# Patient Record
Sex: Male | Born: 1975 | Race: Black or African American | Hispanic: No | Marital: Married | Smoking: Current every day smoker
Health system: Southern US, Community
[De-identification: ages and names within clinical notes are randomized; demographics above are authoritative.]

## PROBLEM LIST (undated history)

## (undated) DIAGNOSIS — F329 Major depressive disorder, single episode, unspecified: Secondary | ICD-10-CM

## (undated) DIAGNOSIS — E119 Type 2 diabetes mellitus without complications: Secondary | ICD-10-CM

## (undated) DIAGNOSIS — K219 Gastro-esophageal reflux disease without esophagitis: Secondary | ICD-10-CM

## (undated) DIAGNOSIS — R609 Edema, unspecified: Secondary | ICD-10-CM

## (undated) DIAGNOSIS — M1990 Unspecified osteoarthritis, unspecified site: Secondary | ICD-10-CM

## (undated) DIAGNOSIS — K319 Disease of stomach and duodenum, unspecified: Secondary | ICD-10-CM

## (undated) DIAGNOSIS — IMO0001 Reserved for inherently not codable concepts without codable children: Secondary | ICD-10-CM

## (undated) DIAGNOSIS — K3184 Gastroparesis: Secondary | ICD-10-CM

## (undated) DIAGNOSIS — F32A Depression, unspecified: Secondary | ICD-10-CM

## (undated) DIAGNOSIS — Z992 Dependence on renal dialysis: Secondary | ICD-10-CM

## (undated) DIAGNOSIS — R51 Headache: Secondary | ICD-10-CM

## (undated) DIAGNOSIS — N186 End stage renal disease: Secondary | ICD-10-CM

## (undated) DIAGNOSIS — S88119A Complete traumatic amputation at level between knee and ankle, unspecified lower leg, initial encounter: Secondary | ICD-10-CM

## (undated) DIAGNOSIS — A4902 Methicillin resistant Staphylococcus aureus infection, unspecified site: Secondary | ICD-10-CM

## (undated) DIAGNOSIS — G8929 Other chronic pain: Secondary | ICD-10-CM

## (undated) DIAGNOSIS — I10 Essential (primary) hypertension: Secondary | ICD-10-CM

## (undated) DIAGNOSIS — H269 Unspecified cataract: Secondary | ICD-10-CM

## (undated) DIAGNOSIS — Z5189 Encounter for other specified aftercare: Secondary | ICD-10-CM

## (undated) HISTORY — DX: Depression, unspecified: F32.A

## (undated) HISTORY — PX: COMBINED KIDNEY-PANCREAS TRANSPLANT: SHX1382

## (undated) HISTORY — DX: Essential (primary) hypertension: I10

## (undated) HISTORY — DX: Gastro-esophageal reflux disease without esophagitis: K21.9

## (undated) HISTORY — DX: Unspecified cataract: H26.9

## (undated) HISTORY — DX: Other chronic pain: G89.29

## (undated) HISTORY — PX: CATARACT EXTRACTION: SUR2

## (undated) HISTORY — DX: Major depressive disorder, single episode, unspecified: F32.9

## (undated) HISTORY — DX: Complete traumatic amputation at level between knee and ankle, unspecified lower leg, initial encounter: S88.119A

## (undated) HISTORY — DX: Headache: R51

## (undated) HISTORY — DX: Disease of stomach and duodenum, unspecified: K31.9

## (undated) HISTORY — DX: Edema, unspecified: R60.9

## (undated) HISTORY — PX: BELOW KNEE LEG AMPUTATION: SUR23

---

## 1998-06-13 ENCOUNTER — Inpatient Hospital Stay (HOSPITAL_COMMUNITY): Admission: EM | Admit: 1998-06-13 | Discharge: 1998-06-24 | Payer: Self-pay | Admitting: Emergency Medicine

## 1999-07-14 ENCOUNTER — Emergency Department (HOSPITAL_COMMUNITY): Admission: EM | Admit: 1999-07-14 | Discharge: 1999-07-14 | Payer: Self-pay | Admitting: Emergency Medicine

## 1999-07-15 ENCOUNTER — Emergency Department (HOSPITAL_COMMUNITY): Admission: EM | Admit: 1999-07-15 | Discharge: 1999-07-15 | Payer: Self-pay | Admitting: Emergency Medicine

## 1999-07-19 ENCOUNTER — Emergency Department (HOSPITAL_COMMUNITY): Admission: EM | Admit: 1999-07-19 | Discharge: 1999-07-19 | Payer: Self-pay | Admitting: Emergency Medicine

## 1999-07-24 ENCOUNTER — Inpatient Hospital Stay (HOSPITAL_COMMUNITY): Admission: EM | Admit: 1999-07-24 | Discharge: 1999-07-31 | Payer: Self-pay | Admitting: Emergency Medicine

## 1999-07-24 ENCOUNTER — Encounter: Payer: Self-pay | Admitting: Emergency Medicine

## 1999-08-02 ENCOUNTER — Encounter (HOSPITAL_COMMUNITY): Admission: RE | Admit: 1999-08-02 | Discharge: 1999-10-31 | Payer: Self-pay | Admitting: Orthopedic Surgery

## 1999-08-24 ENCOUNTER — Encounter: Admission: RE | Admit: 1999-08-24 | Discharge: 1999-08-24 | Payer: Self-pay | Admitting: Hematology and Oncology

## 1999-11-07 ENCOUNTER — Encounter (INDEPENDENT_AMBULATORY_CARE_PROVIDER_SITE_OTHER): Payer: Self-pay | Admitting: *Deleted

## 1999-11-07 ENCOUNTER — Inpatient Hospital Stay (HOSPITAL_COMMUNITY): Admission: EM | Admit: 1999-11-07 | Discharge: 1999-11-11 | Payer: Self-pay | Admitting: Emergency Medicine

## 1999-11-07 ENCOUNTER — Encounter: Payer: Self-pay | Admitting: Emergency Medicine

## 1999-11-23 ENCOUNTER — Encounter: Admission: RE | Admit: 1999-11-23 | Discharge: 2000-02-21 | Payer: Self-pay | Admitting: Orthopedic Surgery

## 1999-11-26 ENCOUNTER — Emergency Department (HOSPITAL_COMMUNITY): Admission: EM | Admit: 1999-11-26 | Discharge: 1999-11-26 | Payer: Self-pay | Admitting: Emergency Medicine

## 1999-11-27 ENCOUNTER — Encounter: Payer: Self-pay | Admitting: Emergency Medicine

## 1999-12-06 ENCOUNTER — Encounter: Admission: RE | Admit: 1999-12-06 | Discharge: 1999-12-06 | Payer: Self-pay | Admitting: Family Medicine

## 2000-01-06 ENCOUNTER — Encounter: Admission: RE | Admit: 2000-01-06 | Discharge: 2000-01-06 | Payer: Self-pay | Admitting: Family Medicine

## 2000-02-20 ENCOUNTER — Encounter (INDEPENDENT_AMBULATORY_CARE_PROVIDER_SITE_OTHER): Payer: Self-pay

## 2000-02-20 ENCOUNTER — Encounter: Payer: Self-pay | Admitting: Family Medicine

## 2000-02-20 ENCOUNTER — Inpatient Hospital Stay (HOSPITAL_COMMUNITY): Admission: EM | Admit: 2000-02-20 | Discharge: 2000-02-24 | Payer: Self-pay | Admitting: Emergency Medicine

## 2000-02-29 ENCOUNTER — Encounter: Admission: RE | Admit: 2000-02-29 | Discharge: 2000-05-29 | Payer: Self-pay | Admitting: Internal Medicine

## 2000-03-03 ENCOUNTER — Encounter: Payer: Self-pay | Admitting: Emergency Medicine

## 2000-03-03 ENCOUNTER — Emergency Department (HOSPITAL_COMMUNITY): Admission: EM | Admit: 2000-03-03 | Discharge: 2000-03-03 | Payer: Self-pay | Admitting: Emergency Medicine

## 2000-03-20 ENCOUNTER — Encounter: Admission: RE | Admit: 2000-03-20 | Discharge: 2000-03-20 | Payer: Self-pay | Admitting: Family Medicine

## 2000-05-16 ENCOUNTER — Emergency Department (HOSPITAL_COMMUNITY): Admission: EM | Admit: 2000-05-16 | Discharge: 2000-05-16 | Payer: Self-pay | Admitting: Emergency Medicine

## 2000-05-17 ENCOUNTER — Inpatient Hospital Stay (HOSPITAL_COMMUNITY): Admission: EM | Admit: 2000-05-17 | Discharge: 2000-06-30 | Payer: Self-pay | Admitting: Emergency Medicine

## 2000-05-17 ENCOUNTER — Encounter: Payer: Self-pay | Admitting: Emergency Medicine

## 2000-05-17 ENCOUNTER — Encounter: Payer: Self-pay | Admitting: Family Medicine

## 2000-05-19 ENCOUNTER — Encounter: Payer: Self-pay | Admitting: Orthopedic Surgery

## 2000-05-20 ENCOUNTER — Encounter: Payer: Self-pay | Admitting: Family Medicine

## 2000-05-23 ENCOUNTER — Encounter: Payer: Self-pay | Admitting: Family Medicine

## 2000-07-04 ENCOUNTER — Encounter: Admission: RE | Admit: 2000-07-04 | Discharge: 2000-10-02 | Payer: Self-pay | Admitting: Orthopedic Surgery

## 2000-07-06 ENCOUNTER — Encounter: Admission: RE | Admit: 2000-07-06 | Discharge: 2000-07-06 | Payer: Self-pay | Admitting: Family Medicine

## 2000-09-13 ENCOUNTER — Encounter: Admission: RE | Admit: 2000-09-13 | Discharge: 2000-09-13 | Payer: Self-pay | Admitting: Family Medicine

## 2000-11-13 ENCOUNTER — Encounter: Admission: RE | Admit: 2000-11-13 | Discharge: 2000-11-13 | Payer: Self-pay | Admitting: Sports Medicine

## 2000-11-14 ENCOUNTER — Encounter: Admission: RE | Admit: 2000-11-14 | Discharge: 2001-02-12 | Payer: Self-pay | Admitting: Orthopedic Surgery

## 2001-01-10 ENCOUNTER — Encounter: Admission: RE | Admit: 2001-01-10 | Discharge: 2001-01-10 | Payer: Self-pay | Admitting: Family Medicine

## 2001-02-01 ENCOUNTER — Encounter: Payer: Self-pay | Admitting: Family Medicine

## 2001-02-01 ENCOUNTER — Inpatient Hospital Stay (HOSPITAL_COMMUNITY): Admission: EM | Admit: 2001-02-01 | Discharge: 2001-02-03 | Payer: Self-pay

## 2001-02-05 ENCOUNTER — Encounter: Admission: RE | Admit: 2001-02-05 | Discharge: 2001-02-05 | Payer: Self-pay | Admitting: Sports Medicine

## 2001-02-27 ENCOUNTER — Encounter: Admission: RE | Admit: 2001-02-27 | Discharge: 2001-05-28 | Payer: Self-pay | Admitting: Orthopedic Surgery

## 2001-04-03 ENCOUNTER — Encounter: Payer: Self-pay | Admitting: Family Medicine

## 2001-04-03 ENCOUNTER — Inpatient Hospital Stay (HOSPITAL_COMMUNITY): Admission: EM | Admit: 2001-04-03 | Discharge: 2001-04-09 | Payer: Self-pay | Admitting: Emergency Medicine

## 2001-04-12 ENCOUNTER — Encounter: Admission: RE | Admit: 2001-04-12 | Discharge: 2001-04-12 | Payer: Self-pay | Admitting: Family Medicine

## 2001-07-07 ENCOUNTER — Emergency Department (HOSPITAL_COMMUNITY): Admission: EM | Admit: 2001-07-07 | Discharge: 2001-07-07 | Payer: Self-pay

## 2001-07-10 ENCOUNTER — Encounter: Admission: RE | Admit: 2001-07-10 | Discharge: 2001-10-08 | Payer: Self-pay | Admitting: Internal Medicine

## 2001-08-18 ENCOUNTER — Encounter: Payer: Self-pay | Admitting: *Deleted

## 2001-08-18 ENCOUNTER — Emergency Department (HOSPITAL_COMMUNITY): Admission: EM | Admit: 2001-08-18 | Discharge: 2001-08-18 | Payer: Self-pay | Admitting: *Deleted

## 2001-08-20 ENCOUNTER — Encounter: Payer: Self-pay | Admitting: Family Medicine

## 2001-08-20 ENCOUNTER — Encounter (INDEPENDENT_AMBULATORY_CARE_PROVIDER_SITE_OTHER): Payer: Self-pay | Admitting: *Deleted

## 2001-08-20 ENCOUNTER — Inpatient Hospital Stay (HOSPITAL_COMMUNITY): Admission: EM | Admit: 2001-08-20 | Discharge: 2001-08-29 | Payer: Self-pay | Admitting: *Deleted

## 2001-08-20 ENCOUNTER — Encounter: Admission: RE | Admit: 2001-08-20 | Discharge: 2001-08-20 | Payer: Self-pay | Admitting: Family Medicine

## 2001-09-05 ENCOUNTER — Encounter: Admission: RE | Admit: 2001-09-05 | Discharge: 2001-09-05 | Payer: Self-pay | Admitting: Family Medicine

## 2001-09-12 ENCOUNTER — Encounter: Admission: RE | Admit: 2001-09-12 | Discharge: 2001-09-12 | Payer: Self-pay | Admitting: Family Medicine

## 2001-10-15 ENCOUNTER — Encounter: Admission: RE | Admit: 2001-10-15 | Discharge: 2001-10-15 | Payer: Self-pay | Admitting: Family Medicine

## 2001-12-04 ENCOUNTER — Encounter: Admission: RE | Admit: 2001-12-04 | Discharge: 2001-12-04 | Payer: Self-pay | Admitting: Family Medicine

## 2002-02-14 ENCOUNTER — Encounter: Admission: RE | Admit: 2002-02-14 | Discharge: 2002-05-15 | Payer: Self-pay | Admitting: Orthopedic Surgery

## 2002-07-12 ENCOUNTER — Emergency Department (HOSPITAL_COMMUNITY): Admission: EM | Admit: 2002-07-12 | Discharge: 2002-07-12 | Payer: Self-pay | Admitting: Emergency Medicine

## 2002-07-16 ENCOUNTER — Encounter: Admission: RE | Admit: 2002-07-16 | Discharge: 2002-07-16 | Payer: Self-pay | Admitting: Family Medicine

## 2002-08-19 ENCOUNTER — Encounter: Admission: RE | Admit: 2002-08-19 | Discharge: 2002-08-19 | Payer: Self-pay | Admitting: Family Medicine

## 2002-09-15 ENCOUNTER — Emergency Department (HOSPITAL_COMMUNITY): Admission: EM | Admit: 2002-09-15 | Discharge: 2002-09-15 | Payer: Self-pay | Admitting: Emergency Medicine

## 2002-09-17 ENCOUNTER — Encounter: Payer: Self-pay | Admitting: Emergency Medicine

## 2002-09-17 ENCOUNTER — Inpatient Hospital Stay (HOSPITAL_COMMUNITY): Admission: EM | Admit: 2002-09-17 | Discharge: 2002-09-24 | Payer: Self-pay

## 2002-09-25 ENCOUNTER — Encounter (HOSPITAL_BASED_OUTPATIENT_CLINIC_OR_DEPARTMENT_OTHER): Admission: RE | Admit: 2002-09-25 | Discharge: 2002-11-15 | Payer: Self-pay | Admitting: Orthopedic Surgery

## 2002-10-02 ENCOUNTER — Encounter: Admission: RE | Admit: 2002-10-02 | Discharge: 2002-10-02 | Payer: Self-pay | Admitting: Family Medicine

## 2002-10-10 ENCOUNTER — Encounter (HOSPITAL_BASED_OUTPATIENT_CLINIC_OR_DEPARTMENT_OTHER): Admission: RE | Admit: 2002-10-10 | Discharge: 2003-01-08 | Payer: Self-pay | Admitting: Internal Medicine

## 2003-01-06 ENCOUNTER — Inpatient Hospital Stay (HOSPITAL_COMMUNITY): Admission: EM | Admit: 2003-01-06 | Discharge: 2003-01-12 | Payer: Self-pay | Admitting: Emergency Medicine

## 2003-01-15 ENCOUNTER — Encounter: Admission: RE | Admit: 2003-01-15 | Discharge: 2003-01-15 | Payer: Self-pay | Admitting: Family Medicine

## 2003-01-27 ENCOUNTER — Encounter: Admission: RE | Admit: 2003-01-27 | Discharge: 2003-01-27 | Payer: Self-pay | Admitting: Family Medicine

## 2003-01-31 ENCOUNTER — Encounter (HOSPITAL_BASED_OUTPATIENT_CLINIC_OR_DEPARTMENT_OTHER): Admission: RE | Admit: 2003-01-31 | Discharge: 2003-05-01 | Payer: Self-pay | Admitting: Internal Medicine

## 2003-03-25 ENCOUNTER — Inpatient Hospital Stay (HOSPITAL_COMMUNITY): Admission: EM | Admit: 2003-03-25 | Discharge: 2003-03-31 | Payer: Self-pay | Admitting: Emergency Medicine

## 2003-03-25 ENCOUNTER — Encounter: Payer: Self-pay | Admitting: Family Medicine

## 2003-03-25 ENCOUNTER — Encounter: Payer: Self-pay | Admitting: Emergency Medicine

## 2003-04-22 ENCOUNTER — Encounter: Admission: RE | Admit: 2003-04-22 | Discharge: 2003-04-22 | Payer: Self-pay | Admitting: Family Medicine

## 2003-05-02 ENCOUNTER — Encounter (HOSPITAL_BASED_OUTPATIENT_CLINIC_OR_DEPARTMENT_OTHER): Admission: RE | Admit: 2003-05-02 | Discharge: 2003-07-16 | Payer: Self-pay | Admitting: Internal Medicine

## 2003-05-02 ENCOUNTER — Encounter: Admission: RE | Admit: 2003-05-02 | Discharge: 2003-05-02 | Payer: Self-pay | Admitting: Family Medicine

## 2003-06-04 ENCOUNTER — Emergency Department (HOSPITAL_COMMUNITY): Admission: EM | Admit: 2003-06-04 | Discharge: 2003-06-05 | Payer: Self-pay | Admitting: Emergency Medicine

## 2003-06-04 ENCOUNTER — Encounter: Payer: Self-pay | Admitting: Emergency Medicine

## 2003-06-05 ENCOUNTER — Inpatient Hospital Stay (HOSPITAL_COMMUNITY): Admission: EM | Admit: 2003-06-05 | Discharge: 2003-06-05 | Payer: Self-pay | Admitting: Internal Medicine

## 2003-06-23 ENCOUNTER — Encounter: Admission: RE | Admit: 2003-06-23 | Discharge: 2003-06-23 | Payer: Self-pay | Admitting: Family Medicine

## 2003-06-24 ENCOUNTER — Encounter: Payer: Self-pay | Admitting: Emergency Medicine

## 2003-06-24 ENCOUNTER — Inpatient Hospital Stay (HOSPITAL_COMMUNITY): Admission: EM | Admit: 2003-06-24 | Discharge: 2003-06-25 | Payer: Self-pay | Admitting: Emergency Medicine

## 2003-08-08 ENCOUNTER — Encounter (HOSPITAL_BASED_OUTPATIENT_CLINIC_OR_DEPARTMENT_OTHER): Admission: RE | Admit: 2003-08-08 | Discharge: 2003-11-05 | Payer: Self-pay | Admitting: Internal Medicine

## 2003-08-18 ENCOUNTER — Encounter: Admission: RE | Admit: 2003-08-18 | Discharge: 2003-08-18 | Payer: Self-pay | Admitting: Sports Medicine

## 2003-09-09 ENCOUNTER — Emergency Department (HOSPITAL_COMMUNITY): Admission: EM | Admit: 2003-09-09 | Discharge: 2003-09-09 | Payer: Self-pay | Admitting: Emergency Medicine

## 2003-09-12 ENCOUNTER — Emergency Department (HOSPITAL_COMMUNITY): Admission: EM | Admit: 2003-09-12 | Discharge: 2003-09-12 | Payer: Self-pay | Admitting: *Deleted

## 2003-09-18 ENCOUNTER — Emergency Department (HOSPITAL_COMMUNITY): Admission: RE | Admit: 2003-09-18 | Discharge: 2003-09-19 | Payer: Self-pay | Admitting: Emergency Medicine

## 2003-09-18 ENCOUNTER — Encounter: Payer: Self-pay | Admitting: Emergency Medicine

## 2003-09-19 ENCOUNTER — Inpatient Hospital Stay (HOSPITAL_COMMUNITY): Admission: AD | Admit: 2003-09-19 | Discharge: 2003-09-27 | Payer: Self-pay | Admitting: Family Medicine

## 2003-09-19 ENCOUNTER — Encounter: Payer: Self-pay | Admitting: Family Medicine

## 2003-09-19 ENCOUNTER — Encounter: Admission: RE | Admit: 2003-09-19 | Discharge: 2003-09-19 | Payer: Self-pay | Admitting: Family Medicine

## 2003-09-20 ENCOUNTER — Encounter: Payer: Self-pay | Admitting: Family Medicine

## 2003-09-22 ENCOUNTER — Encounter: Payer: Self-pay | Admitting: Orthopedic Surgery

## 2003-09-23 ENCOUNTER — Encounter (INDEPENDENT_AMBULATORY_CARE_PROVIDER_SITE_OTHER): Payer: Self-pay | Admitting: Cardiology

## 2003-09-29 ENCOUNTER — Encounter: Admission: RE | Admit: 2003-09-29 | Discharge: 2003-09-29 | Payer: Self-pay | Admitting: Family Medicine

## 2003-09-30 ENCOUNTER — Encounter (HOSPITAL_BASED_OUTPATIENT_CLINIC_OR_DEPARTMENT_OTHER): Admission: RE | Admit: 2003-09-30 | Discharge: 2003-12-29 | Payer: Self-pay

## 2003-10-06 ENCOUNTER — Encounter: Admission: RE | Admit: 2003-10-06 | Discharge: 2003-10-06 | Payer: Self-pay | Admitting: Family Medicine

## 2003-10-15 ENCOUNTER — Encounter: Admission: RE | Admit: 2003-10-15 | Discharge: 2003-10-15 | Payer: Self-pay | Admitting: Family Medicine

## 2003-10-20 ENCOUNTER — Encounter: Admission: RE | Admit: 2003-10-20 | Discharge: 2003-10-20 | Payer: Self-pay | Admitting: Family Medicine

## 2003-11-06 ENCOUNTER — Encounter: Admission: RE | Admit: 2003-11-06 | Discharge: 2004-02-04 | Payer: Self-pay | Admitting: Internal Medicine

## 2003-12-02 ENCOUNTER — Encounter: Admission: RE | Admit: 2003-12-02 | Discharge: 2003-12-02 | Payer: Self-pay | Admitting: Sports Medicine

## 2003-12-30 ENCOUNTER — Encounter (HOSPITAL_BASED_OUTPATIENT_CLINIC_OR_DEPARTMENT_OTHER): Admission: RE | Admit: 2003-12-30 | Discharge: 2004-02-13 | Payer: Self-pay | Admitting: Internal Medicine

## 2004-02-04 ENCOUNTER — Encounter: Admission: RE | Admit: 2004-02-04 | Discharge: 2004-02-04 | Payer: Self-pay | Admitting: Family Medicine

## 2004-02-13 ENCOUNTER — Encounter (HOSPITAL_BASED_OUTPATIENT_CLINIC_OR_DEPARTMENT_OTHER): Admission: RE | Admit: 2004-02-13 | Discharge: 2004-04-08 | Payer: Self-pay | Admitting: Internal Medicine

## 2004-02-18 ENCOUNTER — Encounter: Admission: RE | Admit: 2004-02-18 | Discharge: 2004-02-18 | Payer: Self-pay | Admitting: Family Medicine

## 2004-03-09 ENCOUNTER — Encounter: Admission: RE | Admit: 2004-03-09 | Discharge: 2004-03-09 | Payer: Self-pay | Admitting: Family Medicine

## 2004-03-24 ENCOUNTER — Emergency Department (HOSPITAL_COMMUNITY): Admission: EM | Admit: 2004-03-24 | Discharge: 2004-03-24 | Payer: Self-pay | Admitting: Emergency Medicine

## 2004-04-09 ENCOUNTER — Encounter: Admission: RE | Admit: 2004-04-09 | Discharge: 2004-04-09 | Payer: Self-pay | Admitting: Sports Medicine

## 2004-05-18 ENCOUNTER — Encounter (HOSPITAL_BASED_OUTPATIENT_CLINIC_OR_DEPARTMENT_OTHER): Admission: RE | Admit: 2004-05-18 | Discharge: 2004-07-30 | Payer: Self-pay | Admitting: Internal Medicine

## 2004-06-03 ENCOUNTER — Inpatient Hospital Stay (HOSPITAL_COMMUNITY): Admission: AD | Admit: 2004-06-03 | Discharge: 2004-06-07 | Payer: Self-pay | Admitting: Family Medicine

## 2004-06-03 ENCOUNTER — Encounter: Admission: RE | Admit: 2004-06-03 | Discharge: 2004-06-03 | Payer: Self-pay | Admitting: Family Medicine

## 2004-06-29 ENCOUNTER — Encounter: Admission: RE | Admit: 2004-06-29 | Discharge: 2004-06-29 | Payer: Self-pay | Admitting: Sports Medicine

## 2004-07-20 ENCOUNTER — Encounter: Admission: RE | Admit: 2004-07-20 | Discharge: 2004-07-20 | Payer: Self-pay | Admitting: Sports Medicine

## 2004-08-18 ENCOUNTER — Encounter (HOSPITAL_BASED_OUTPATIENT_CLINIC_OR_DEPARTMENT_OTHER): Admission: RE | Admit: 2004-08-18 | Discharge: 2004-11-01 | Payer: Self-pay | Admitting: Internal Medicine

## 2004-10-06 ENCOUNTER — Inpatient Hospital Stay (HOSPITAL_COMMUNITY): Admission: EM | Admit: 2004-10-06 | Discharge: 2004-10-06 | Payer: Self-pay | Admitting: Emergency Medicine

## 2004-10-06 ENCOUNTER — Ambulatory Visit: Payer: Self-pay | Admitting: Sports Medicine

## 2004-10-12 ENCOUNTER — Ambulatory Visit: Payer: Self-pay | Admitting: Sports Medicine

## 2004-11-02 ENCOUNTER — Ambulatory Visit: Payer: Self-pay | Admitting: Family Medicine

## 2004-12-21 ENCOUNTER — Encounter (HOSPITAL_BASED_OUTPATIENT_CLINIC_OR_DEPARTMENT_OTHER): Admission: RE | Admit: 2004-12-21 | Discharge: 2005-03-04 | Payer: Self-pay | Admitting: Internal Medicine

## 2004-12-29 ENCOUNTER — Ambulatory Visit: Payer: Self-pay | Admitting: Family Medicine

## 2005-01-05 ENCOUNTER — Ambulatory Visit: Payer: Self-pay | Admitting: Family Medicine

## 2005-01-13 ENCOUNTER — Ambulatory Visit: Payer: Self-pay | Admitting: Family Medicine

## 2005-01-27 ENCOUNTER — Ambulatory Visit: Payer: Self-pay | Admitting: Family Medicine

## 2005-02-09 ENCOUNTER — Ambulatory Visit: Payer: Self-pay | Admitting: Sports Medicine

## 2005-02-10 ENCOUNTER — Inpatient Hospital Stay (HOSPITAL_COMMUNITY): Admission: EM | Admit: 2005-02-10 | Discharge: 2005-02-13 | Payer: Self-pay | Admitting: Emergency Medicine

## 2005-02-10 ENCOUNTER — Ambulatory Visit: Payer: Self-pay | Admitting: Family Medicine

## 2005-03-21 ENCOUNTER — Ambulatory Visit: Payer: Self-pay | Admitting: Sports Medicine

## 2005-03-28 ENCOUNTER — Encounter (HOSPITAL_BASED_OUTPATIENT_CLINIC_OR_DEPARTMENT_OTHER): Admission: RE | Admit: 2005-03-28 | Discharge: 2005-06-26 | Payer: Self-pay | Admitting: Surgery

## 2005-05-31 ENCOUNTER — Ambulatory Visit: Payer: Self-pay | Admitting: Family Medicine

## 2005-06-27 ENCOUNTER — Ambulatory Visit: Payer: Self-pay | Admitting: Family Medicine

## 2005-07-04 ENCOUNTER — Encounter (HOSPITAL_BASED_OUTPATIENT_CLINIC_OR_DEPARTMENT_OTHER): Admission: RE | Admit: 2005-07-04 | Discharge: 2005-10-02 | Payer: Self-pay | Admitting: Surgery

## 2005-07-13 ENCOUNTER — Ambulatory Visit: Payer: Self-pay | Admitting: Family Medicine

## 2005-08-05 ENCOUNTER — Ambulatory Visit: Payer: Self-pay | Admitting: Family Medicine

## 2005-08-11 ENCOUNTER — Inpatient Hospital Stay (HOSPITAL_COMMUNITY): Admission: RE | Admit: 2005-08-11 | Discharge: 2005-08-12 | Payer: Self-pay | Admitting: Orthopedic Surgery

## 2005-08-27 ENCOUNTER — Emergency Department (HOSPITAL_COMMUNITY): Admission: EM | Admit: 2005-08-27 | Discharge: 2005-08-27 | Payer: Self-pay | Admitting: *Deleted

## 2005-09-02 ENCOUNTER — Ambulatory Visit: Payer: Self-pay | Admitting: Family Medicine

## 2005-10-25 ENCOUNTER — Ambulatory Visit: Payer: Self-pay | Admitting: Family Medicine

## 2005-11-23 ENCOUNTER — Ambulatory Visit: Payer: Self-pay | Admitting: Family Medicine

## 2006-01-02 ENCOUNTER — Inpatient Hospital Stay (HOSPITAL_COMMUNITY): Admission: EM | Admit: 2006-01-02 | Discharge: 2006-01-04 | Payer: Self-pay | Admitting: Emergency Medicine

## 2006-01-02 ENCOUNTER — Encounter (INDEPENDENT_AMBULATORY_CARE_PROVIDER_SITE_OTHER): Payer: Self-pay | Admitting: *Deleted

## 2006-01-02 ENCOUNTER — Ambulatory Visit: Payer: Self-pay | Admitting: Gastroenterology

## 2006-01-02 ENCOUNTER — Ambulatory Visit: Payer: Self-pay | Admitting: Family Medicine

## 2006-01-10 ENCOUNTER — Ambulatory Visit: Payer: Self-pay | Admitting: Family Medicine

## 2006-01-13 ENCOUNTER — Ambulatory Visit: Payer: Self-pay | Admitting: Family Medicine

## 2006-03-28 ENCOUNTER — Ambulatory Visit: Payer: Self-pay | Admitting: Family Medicine

## 2006-05-20 ENCOUNTER — Emergency Department (HOSPITAL_COMMUNITY): Admission: EM | Admit: 2006-05-20 | Discharge: 2006-05-21 | Payer: Self-pay | Admitting: Emergency Medicine

## 2006-05-24 ENCOUNTER — Ambulatory Visit: Payer: Self-pay | Admitting: Family Medicine

## 2006-06-20 ENCOUNTER — Ambulatory Visit: Payer: Self-pay | Admitting: Family Medicine

## 2006-07-25 ENCOUNTER — Inpatient Hospital Stay (HOSPITAL_COMMUNITY): Admission: EM | Admit: 2006-07-25 | Discharge: 2006-07-27 | Payer: Self-pay | Admitting: Emergency Medicine

## 2006-07-25 ENCOUNTER — Ambulatory Visit: Payer: Self-pay | Admitting: Sports Medicine

## 2006-08-07 ENCOUNTER — Ambulatory Visit: Payer: Self-pay | Admitting: Sports Medicine

## 2006-08-29 ENCOUNTER — Ambulatory Visit: Payer: Self-pay | Admitting: Sports Medicine

## 2006-08-30 ENCOUNTER — Ambulatory Visit: Payer: Self-pay | Admitting: Family Medicine

## 2006-12-08 ENCOUNTER — Ambulatory Visit: Payer: Self-pay | Admitting: Sports Medicine

## 2006-12-08 ENCOUNTER — Emergency Department (HOSPITAL_COMMUNITY): Admission: EM | Admit: 2006-12-08 | Discharge: 2006-12-08 | Payer: Self-pay | Admitting: Emergency Medicine

## 2007-01-01 ENCOUNTER — Ambulatory Visit: Payer: Self-pay | Admitting: Family Medicine

## 2007-01-01 ENCOUNTER — Encounter: Payer: Self-pay | Admitting: Family Medicine

## 2007-01-01 LAB — CONVERTED CEMR LAB
ALT: <8 units/L (ref 0–53)
BUN: 18 mg/dL (ref 6–23)
CO2: 25 meq/L (ref 19–32)
Calcium: 8.7 mg/dL (ref 8.4–10.5)
Chloride: 96 meq/L (ref 96–112)
Creatinine, Ser: 1.57 mg/dL — ABNORMAL HIGH (ref 0.40–1.50)
Glucose, Bld: 267 mg/dL — ABNORMAL HIGH (ref 70–99)
Lipase: <10 units/L (ref 0–75)
Total Bilirubin: 0.3 mg/dL (ref 0.3–1.2)

## 2007-01-02 ENCOUNTER — Encounter: Admission: RE | Admit: 2007-01-02 | Discharge: 2007-01-02 | Payer: Self-pay | Admitting: Sports Medicine

## 2007-01-04 ENCOUNTER — Ambulatory Visit: Payer: Self-pay | Admitting: Family Medicine

## 2007-01-04 ENCOUNTER — Inpatient Hospital Stay (HOSPITAL_COMMUNITY): Admission: EM | Admit: 2007-01-04 | Discharge: 2007-01-08 | Payer: Self-pay | Admitting: Emergency Medicine

## 2007-01-06 ENCOUNTER — Ambulatory Visit: Payer: Self-pay | Admitting: Internal Medicine

## 2007-01-11 ENCOUNTER — Encounter: Payer: Self-pay | Admitting: Family Medicine

## 2007-01-11 ENCOUNTER — Ambulatory Visit: Payer: Self-pay | Admitting: Family Medicine

## 2007-01-16 ENCOUNTER — Encounter (INDEPENDENT_AMBULATORY_CARE_PROVIDER_SITE_OTHER): Payer: Self-pay | Admitting: Cardiology

## 2007-01-16 ENCOUNTER — Ambulatory Visit (HOSPITAL_COMMUNITY): Admission: RE | Admit: 2007-01-16 | Discharge: 2007-01-16 | Payer: Self-pay | Admitting: Sports Medicine

## 2007-01-16 ENCOUNTER — Encounter: Admission: RE | Admit: 2007-01-16 | Discharge: 2007-04-16 | Payer: Self-pay | Admitting: Sports Medicine

## 2007-01-18 ENCOUNTER — Ambulatory Visit: Payer: Self-pay | Admitting: Family Medicine

## 2007-02-22 DIAGNOSIS — F339 Major depressive disorder, recurrent, unspecified: Secondary | ICD-10-CM

## 2007-02-22 DIAGNOSIS — F4310 Post-traumatic stress disorder, unspecified: Secondary | ICD-10-CM

## 2007-02-22 DIAGNOSIS — E785 Hyperlipidemia, unspecified: Secondary | ICD-10-CM | POA: Insufficient documentation

## 2007-02-22 DIAGNOSIS — N529 Male erectile dysfunction, unspecified: Secondary | ICD-10-CM

## 2007-02-22 DIAGNOSIS — E1165 Type 2 diabetes mellitus with hyperglycemia: Secondary | ICD-10-CM

## 2007-02-22 DIAGNOSIS — I10 Essential (primary) hypertension: Secondary | ICD-10-CM | POA: Insufficient documentation

## 2007-03-14 ENCOUNTER — Encounter: Payer: Self-pay | Admitting: Family Medicine

## 2007-03-14 ENCOUNTER — Ambulatory Visit: Payer: Self-pay | Admitting: Family Medicine

## 2007-03-14 DIAGNOSIS — K219 Gastro-esophageal reflux disease without esophagitis: Secondary | ICD-10-CM | POA: Insufficient documentation

## 2007-03-14 LAB — CONVERTED CEMR LAB: Hgb A1c MFr Bld: 7.7 %

## 2007-03-16 ENCOUNTER — Encounter (INDEPENDENT_AMBULATORY_CARE_PROVIDER_SITE_OTHER): Payer: Self-pay | Admitting: *Deleted

## 2007-03-16 LAB — CONVERTED CEMR LAB
ALT: 16 units/L (ref 0–53)
Alkaline Phosphatase: 116 units/L (ref 39–117)
CO2: 23 meq/L (ref 19–32)
Cholesterol: 205 mg/dL — ABNORMAL HIGH (ref 0–200)
Creatinine, Ser: 1.52 mg/dL — ABNORMAL HIGH (ref 0.40–1.50)
LDL Cholesterol: 135 mg/dL — ABNORMAL HIGH (ref 0–99)
Sodium: 139 meq/L (ref 135–145)
Total Bilirubin: 0.3 mg/dL (ref 0.3–1.2)
Total CHOL/HDL Ratio: 4.5
Total Protein: 7.4 g/dL (ref 6.0–8.3)
Triglycerides: 119 mg/dL (ref <150)
VLDL: 24 mg/dL (ref 0–40)

## 2007-04-13 ENCOUNTER — Emergency Department (HOSPITAL_COMMUNITY): Admission: EM | Admit: 2007-04-13 | Discharge: 2007-04-13 | Payer: Self-pay | Admitting: Emergency Medicine

## 2007-05-28 ENCOUNTER — Telehealth (INDEPENDENT_AMBULATORY_CARE_PROVIDER_SITE_OTHER): Payer: Self-pay | Admitting: Family Medicine

## 2007-05-29 ENCOUNTER — Telehealth (INDEPENDENT_AMBULATORY_CARE_PROVIDER_SITE_OTHER): Payer: Self-pay | Admitting: *Deleted

## 2007-07-10 ENCOUNTER — Encounter (INDEPENDENT_AMBULATORY_CARE_PROVIDER_SITE_OTHER): Payer: Self-pay | Admitting: *Deleted

## 2007-09-04 ENCOUNTER — Telehealth (INDEPENDENT_AMBULATORY_CARE_PROVIDER_SITE_OTHER): Payer: Self-pay | Admitting: *Deleted

## 2007-09-26 ENCOUNTER — Encounter: Admission: RE | Admit: 2007-09-26 | Discharge: 2007-09-26 | Payer: Self-pay | Admitting: Sports Medicine

## 2007-09-26 ENCOUNTER — Ambulatory Visit: Payer: Self-pay | Admitting: Family Medicine

## 2007-09-26 ENCOUNTER — Encounter: Payer: Self-pay | Admitting: *Deleted

## 2007-09-26 ENCOUNTER — Ambulatory Visit (HOSPITAL_COMMUNITY): Admission: RE | Admit: 2007-09-26 | Discharge: 2007-09-26 | Payer: Self-pay | Admitting: Family Medicine

## 2007-09-28 ENCOUNTER — Encounter: Payer: Self-pay | Admitting: *Deleted

## 2007-09-28 ENCOUNTER — Encounter (INDEPENDENT_AMBULATORY_CARE_PROVIDER_SITE_OTHER): Payer: Self-pay | Admitting: Family Medicine

## 2007-10-19 ENCOUNTER — Encounter: Payer: Self-pay | Admitting: *Deleted

## 2007-11-30 ENCOUNTER — Encounter (INDEPENDENT_AMBULATORY_CARE_PROVIDER_SITE_OTHER): Payer: Self-pay | Admitting: *Deleted

## 2008-01-31 ENCOUNTER — Ambulatory Visit: Payer: Self-pay | Admitting: Family Medicine

## 2008-01-31 ENCOUNTER — Inpatient Hospital Stay (HOSPITAL_COMMUNITY): Admission: AD | Admit: 2008-01-31 | Discharge: 2008-02-04 | Payer: Self-pay | Admitting: Family Medicine

## 2008-01-31 ENCOUNTER — Encounter: Payer: Self-pay | Admitting: Family Medicine

## 2008-01-31 LAB — CONVERTED CEMR LAB
Nitrite: NEGATIVE
Protein, U semiquant: >=300
Specific Gravity, Urine: <1.005
Urobilinogen, UA: 0.2
pH: 5.5

## 2008-02-01 ENCOUNTER — Telehealth (INDEPENDENT_AMBULATORY_CARE_PROVIDER_SITE_OTHER): Payer: Self-pay | Admitting: *Deleted

## 2008-02-03 ENCOUNTER — Encounter: Payer: Self-pay | Admitting: Internal Medicine

## 2008-02-04 LAB — CONVERTED CEMR LAB: Hgb A1c MFr Bld: 15.1 %

## 2008-02-06 ENCOUNTER — Ambulatory Visit: Payer: Self-pay | Admitting: Internal Medicine

## 2008-02-20 ENCOUNTER — Telehealth (INDEPENDENT_AMBULATORY_CARE_PROVIDER_SITE_OTHER): Payer: Self-pay | Admitting: Family Medicine

## 2008-02-21 ENCOUNTER — Ambulatory Visit: Payer: Self-pay | Admitting: Family Medicine

## 2008-03-03 ENCOUNTER — Telehealth (INDEPENDENT_AMBULATORY_CARE_PROVIDER_SITE_OTHER): Payer: Self-pay | Admitting: Family Medicine

## 2008-03-04 ENCOUNTER — Telehealth (INDEPENDENT_AMBULATORY_CARE_PROVIDER_SITE_OTHER): Payer: Self-pay | Admitting: Family Medicine

## 2008-03-05 ENCOUNTER — Ambulatory Visit: Payer: Self-pay | Admitting: Family Medicine

## 2008-03-05 ENCOUNTER — Encounter (INDEPENDENT_AMBULATORY_CARE_PROVIDER_SITE_OTHER): Payer: Self-pay | Admitting: Family Medicine

## 2008-03-06 ENCOUNTER — Ambulatory Visit: Payer: Self-pay | Admitting: Sports Medicine

## 2008-03-06 ENCOUNTER — Encounter (INDEPENDENT_AMBULATORY_CARE_PROVIDER_SITE_OTHER): Payer: Self-pay | Admitting: Family Medicine

## 2008-03-09 ENCOUNTER — Encounter (INDEPENDENT_AMBULATORY_CARE_PROVIDER_SITE_OTHER): Payer: Self-pay | Admitting: *Deleted

## 2008-03-09 LAB — CONVERTED CEMR LAB: TSH: 0.867 microintl units/mL (ref 0.350–5.50)

## 2008-03-11 ENCOUNTER — Telehealth (INDEPENDENT_AMBULATORY_CARE_PROVIDER_SITE_OTHER): Payer: Self-pay | Admitting: Family Medicine

## 2008-03-12 ENCOUNTER — Telehealth (INDEPENDENT_AMBULATORY_CARE_PROVIDER_SITE_OTHER): Payer: Self-pay | Admitting: Family Medicine

## 2008-03-20 ENCOUNTER — Encounter (INDEPENDENT_AMBULATORY_CARE_PROVIDER_SITE_OTHER): Payer: Self-pay | Admitting: Family Medicine

## 2008-03-24 ENCOUNTER — Telehealth (INDEPENDENT_AMBULATORY_CARE_PROVIDER_SITE_OTHER): Payer: Self-pay | Admitting: *Deleted

## 2008-03-26 ENCOUNTER — Telehealth (INDEPENDENT_AMBULATORY_CARE_PROVIDER_SITE_OTHER): Payer: Self-pay | Admitting: Family Medicine

## 2008-03-26 LAB — CONVERTED CEMR LAB: Hgb A1c MFr Bld: 10 %

## 2008-03-27 ENCOUNTER — Encounter (INDEPENDENT_AMBULATORY_CARE_PROVIDER_SITE_OTHER): Payer: Self-pay | Admitting: Family Medicine

## 2008-03-28 ENCOUNTER — Telehealth (INDEPENDENT_AMBULATORY_CARE_PROVIDER_SITE_OTHER): Payer: Self-pay | Admitting: Family Medicine

## 2008-04-02 ENCOUNTER — Encounter (INDEPENDENT_AMBULATORY_CARE_PROVIDER_SITE_OTHER): Payer: Self-pay | Admitting: Family Medicine

## 2008-04-02 LAB — CONVERTED CEMR LAB: Hemoglobin: 9.6 g/dL

## 2008-04-03 ENCOUNTER — Encounter (INDEPENDENT_AMBULATORY_CARE_PROVIDER_SITE_OTHER): Payer: Self-pay | Admitting: Family Medicine

## 2008-04-04 ENCOUNTER — Encounter (INDEPENDENT_AMBULATORY_CARE_PROVIDER_SITE_OTHER): Payer: Self-pay | Admitting: Family Medicine

## 2008-04-08 ENCOUNTER — Ambulatory Visit: Payer: Self-pay | Admitting: Family Medicine

## 2008-04-08 ENCOUNTER — Encounter (INDEPENDENT_AMBULATORY_CARE_PROVIDER_SITE_OTHER): Payer: Self-pay | Admitting: Family Medicine

## 2008-04-08 DIAGNOSIS — S88119A Complete traumatic amputation at level between knee and ankle, unspecified lower leg, initial encounter: Secondary | ICD-10-CM

## 2008-04-08 HISTORY — DX: Complete traumatic amputation at level between knee and ankle, unspecified lower leg, initial encounter: S88.119A

## 2008-04-08 LAB — CONVERTED CEMR LAB: Creatinine, Ser: 1 mg/dL

## 2008-04-14 ENCOUNTER — Encounter (INDEPENDENT_AMBULATORY_CARE_PROVIDER_SITE_OTHER): Payer: Self-pay | Admitting: Family Medicine

## 2008-04-17 ENCOUNTER — Encounter (INDEPENDENT_AMBULATORY_CARE_PROVIDER_SITE_OTHER): Payer: Self-pay | Admitting: Family Medicine

## 2008-04-29 ENCOUNTER — Telehealth: Payer: Self-pay | Admitting: *Deleted

## 2008-05-30 ENCOUNTER — Encounter (INDEPENDENT_AMBULATORY_CARE_PROVIDER_SITE_OTHER): Payer: Self-pay | Admitting: Family Medicine

## 2008-06-17 ENCOUNTER — Emergency Department (HOSPITAL_COMMUNITY): Admission: EM | Admit: 2008-06-17 | Discharge: 2008-06-17 | Payer: Self-pay | Admitting: Family Medicine

## 2008-09-18 ENCOUNTER — Telehealth: Payer: Self-pay | Admitting: *Deleted

## 2008-09-25 ENCOUNTER — Telehealth (INDEPENDENT_AMBULATORY_CARE_PROVIDER_SITE_OTHER): Payer: Self-pay | Admitting: Family Medicine

## 2008-09-26 ENCOUNTER — Ambulatory Visit: Payer: Self-pay | Admitting: Family Medicine

## 2008-10-15 ENCOUNTER — Ambulatory Visit: Payer: Self-pay | Admitting: Family Medicine

## 2008-10-15 LAB — CONVERTED CEMR LAB

## 2008-11-10 ENCOUNTER — Encounter: Payer: Self-pay | Admitting: *Deleted

## 2008-11-10 ENCOUNTER — Ambulatory Visit: Payer: Self-pay | Admitting: Family Medicine

## 2008-11-12 ENCOUNTER — Ambulatory Visit: Payer: Self-pay | Admitting: Family Medicine

## 2008-11-14 ENCOUNTER — Ambulatory Visit: Payer: Self-pay | Admitting: Family Medicine

## 2008-11-19 ENCOUNTER — Ambulatory Visit: Payer: Self-pay | Admitting: Family Medicine

## 2009-03-05 ENCOUNTER — Emergency Department (HOSPITAL_COMMUNITY): Admission: EM | Admit: 2009-03-05 | Discharge: 2009-03-06 | Payer: Self-pay | Admitting: Emergency Medicine

## 2009-05-12 ENCOUNTER — Telehealth (INDEPENDENT_AMBULATORY_CARE_PROVIDER_SITE_OTHER): Payer: Self-pay | Admitting: Family Medicine

## 2009-06-02 ENCOUNTER — Encounter (INDEPENDENT_AMBULATORY_CARE_PROVIDER_SITE_OTHER): Payer: Self-pay | Admitting: Family Medicine

## 2009-06-09 ENCOUNTER — Telehealth: Payer: Self-pay | Admitting: Sports Medicine

## 2009-06-09 ENCOUNTER — Encounter: Payer: Self-pay | Admitting: Family Medicine

## 2009-06-09 ENCOUNTER — Ambulatory Visit: Payer: Self-pay | Admitting: Family Medicine

## 2009-06-09 LAB — CONVERTED CEMR LAB
Basophils Absolute: 0.1 10*3/uL (ref 0.0–0.1)
Hemoglobin: 12 g/dL — ABNORMAL LOW (ref 13.0–17.0)
Lymphocytes Relative: 34 % (ref 12–46)
Monocytes Absolute: 0.6 10*3/uL (ref 0.1–1.0)
Monocytes Relative: 9 % (ref 3–12)
Neutro Abs: 3.5 10*3/uL (ref 1.7–7.7)
Neutrophils Relative %: 52 % (ref 43–77)
RBC: 3.97 M/uL — ABNORMAL LOW (ref 4.22–5.81)

## 2009-06-10 ENCOUNTER — Telehealth (INDEPENDENT_AMBULATORY_CARE_PROVIDER_SITE_OTHER): Payer: Self-pay | Admitting: Family Medicine

## 2009-06-10 ENCOUNTER — Telehealth: Payer: Self-pay | Admitting: *Deleted

## 2009-09-09 ENCOUNTER — Telehealth: Payer: Self-pay | Admitting: Family Medicine

## 2009-09-10 ENCOUNTER — Ambulatory Visit: Payer: Self-pay | Admitting: Family Medicine

## 2009-09-10 ENCOUNTER — Inpatient Hospital Stay (HOSPITAL_COMMUNITY): Admission: EM | Admit: 2009-09-10 | Discharge: 2009-09-13 | Payer: Self-pay | Admitting: Emergency Medicine

## 2009-09-10 ENCOUNTER — Encounter: Payer: Self-pay | Admitting: Family Medicine

## 2009-09-10 ENCOUNTER — Telehealth: Payer: Self-pay | Admitting: Sports Medicine

## 2009-09-10 LAB — CONVERTED CEMR LAB: Microalb, Ur: 100 mg/dL

## 2009-09-13 LAB — CONVERTED CEMR LAB
HDL: 31 mg/dL
LDL Cholesterol: 64 mg/dL

## 2009-11-02 ENCOUNTER — Telehealth: Payer: Self-pay | Admitting: Sports Medicine

## 2009-11-05 ENCOUNTER — Encounter: Payer: Self-pay | Admitting: Sports Medicine

## 2009-11-16 ENCOUNTER — Ambulatory Visit: Payer: Self-pay | Admitting: Family Medicine

## 2009-11-16 ENCOUNTER — Encounter: Payer: Self-pay | Admitting: Sports Medicine

## 2009-11-16 LAB — CONVERTED CEMR LAB: Hgb A1c MFr Bld: 10.9 %

## 2009-11-17 DIAGNOSIS — N186 End stage renal disease: Secondary | ICD-10-CM

## 2009-11-17 LAB — CONVERTED CEMR LAB
BUN: 21 mg/dL (ref 6–23)
CO2: 24 meq/L (ref 19–32)
Calcium: 9.2 mg/dL (ref 8.4–10.5)
Chloride: 102 meq/L (ref 96–112)
Creatinine, Ser: 1.9 mg/dL — ABNORMAL HIGH (ref 0.40–1.50)
Glucose, Bld: 246 mg/dL — ABNORMAL HIGH (ref 70–99)
Potassium: 4.4 meq/L (ref 3.5–5.3)
Sodium: 139 meq/L (ref 135–145)

## 2009-11-25 ENCOUNTER — Telehealth: Payer: Self-pay | Admitting: Sports Medicine

## 2009-11-30 ENCOUNTER — Encounter: Payer: Self-pay | Admitting: Sports Medicine

## 2009-11-30 ENCOUNTER — Inpatient Hospital Stay (HOSPITAL_COMMUNITY): Admission: AD | Admit: 2009-11-30 | Discharge: 2009-12-05 | Payer: Self-pay | Admitting: Family Medicine

## 2009-11-30 ENCOUNTER — Ambulatory Visit: Payer: Self-pay | Admitting: Family Medicine

## 2009-11-30 ENCOUNTER — Ambulatory Visit (HOSPITAL_COMMUNITY): Admission: RE | Admit: 2009-11-30 | Discharge: 2009-11-30 | Payer: Self-pay | Admitting: Family Medicine

## 2009-11-30 ENCOUNTER — Ambulatory Visit: Payer: Self-pay | Admitting: Internal Medicine

## 2009-11-30 LAB — CONVERTED CEMR LAB: Hgb A1c MFr Bld: 11.6 %

## 2009-12-01 ENCOUNTER — Encounter: Payer: Self-pay | Admitting: Sports Medicine

## 2009-12-01 LAB — CONVERTED CEMR LAB
HDL: 41 mg/dL
LDL Cholesterol: 116 mg/dL
Microalb, Ur: ABNORMAL mg/dL

## 2009-12-03 ENCOUNTER — Encounter: Payer: Self-pay | Admitting: Cardiology

## 2009-12-07 ENCOUNTER — Ambulatory Visit: Payer: Self-pay | Admitting: Family Medicine

## 2009-12-07 ENCOUNTER — Encounter: Payer: Self-pay | Admitting: Sports Medicine

## 2009-12-07 LAB — CONVERTED CEMR LAB
BUN: 18 mg/dL (ref 6–23)
CO2: 27 meq/L (ref 19–32)
Calcium: 9.1 mg/dL (ref 8.4–10.5)
Chloride: 108 meq/L (ref 96–112)
Creatinine, Ser: 2.09 mg/dL — ABNORMAL HIGH (ref 0.40–1.50)
Glucose, Bld: 146 mg/dL — ABNORMAL HIGH (ref 70–99)
Potassium: 4.7 meq/L (ref 3.5–5.3)
Sodium: 143 meq/L (ref 135–145)

## 2009-12-17 ENCOUNTER — Telehealth: Payer: Self-pay | Admitting: Sports Medicine

## 2009-12-21 ENCOUNTER — Encounter: Payer: Self-pay | Admitting: *Deleted

## 2009-12-29 ENCOUNTER — Telehealth: Payer: Self-pay | Admitting: Sports Medicine

## 2009-12-30 ENCOUNTER — Ambulatory Visit: Payer: Self-pay | Admitting: Family Medicine

## 2009-12-30 ENCOUNTER — Ambulatory Visit (HOSPITAL_COMMUNITY): Admission: RE | Admit: 2009-12-30 | Discharge: 2009-12-30 | Payer: Self-pay | Admitting: Family Medicine

## 2010-01-04 ENCOUNTER — Encounter: Payer: Self-pay | Admitting: Sports Medicine

## 2010-01-07 ENCOUNTER — Telehealth: Payer: Self-pay | Admitting: Sports Medicine

## 2010-02-05 ENCOUNTER — Telehealth: Payer: Self-pay | Admitting: Sports Medicine

## 2010-03-03 ENCOUNTER — Telehealth: Payer: Self-pay | Admitting: *Deleted

## 2010-03-14 ENCOUNTER — Encounter: Payer: Self-pay | Admitting: *Deleted

## 2010-05-06 ENCOUNTER — Telehealth: Payer: Self-pay | Admitting: Sports Medicine

## 2010-05-07 ENCOUNTER — Ambulatory Visit: Payer: Self-pay | Admitting: Family Medicine

## 2010-07-26 ENCOUNTER — Ambulatory Visit: Payer: Self-pay | Admitting: Family Medicine

## 2010-07-26 LAB — CONVERTED CEMR LAB: Hgb A1c MFr Bld: 12.8 %

## 2010-08-17 ENCOUNTER — Encounter: Payer: Self-pay | Admitting: Sports Medicine

## 2010-08-17 ENCOUNTER — Ambulatory Visit: Payer: Self-pay | Admitting: Family Medicine

## 2010-08-18 LAB — CONVERTED CEMR LAB
BUN: 29 mg/dL — ABNORMAL HIGH (ref 6–23)
CO2: 22 meq/L (ref 19–32)
Calcium: 8.5 mg/dL (ref 8.4–10.5)
Chloride: 94 meq/L — ABNORMAL LOW (ref 96–112)
Creatinine, Ser: 3.08 mg/dL — ABNORMAL HIGH (ref 0.40–1.50)
Glucose, Bld: 669 mg/dL (ref 70–99)
Potassium: 4.3 meq/L (ref 3.5–5.3)
Sodium: 128 meq/L — ABNORMAL LOW (ref 135–145)

## 2010-09-01 ENCOUNTER — Encounter: Payer: Self-pay | Admitting: Sports Medicine

## 2010-09-01 ENCOUNTER — Telehealth: Payer: Self-pay | Admitting: Sports Medicine

## 2010-09-01 ENCOUNTER — Ambulatory Visit: Payer: Self-pay | Admitting: Family Medicine

## 2010-09-13 ENCOUNTER — Ambulatory Visit: Payer: Self-pay | Admitting: Family Medicine

## 2010-09-13 ENCOUNTER — Encounter: Payer: Self-pay | Admitting: Sports Medicine

## 2010-09-16 ENCOUNTER — Ambulatory Visit: Payer: Self-pay | Admitting: Family Medicine

## 2010-09-17 ENCOUNTER — Telehealth: Payer: Self-pay | Admitting: Sports Medicine

## 2010-10-08 ENCOUNTER — Encounter: Payer: Self-pay | Admitting: Sports Medicine

## 2010-10-08 ENCOUNTER — Ambulatory Visit: Payer: Self-pay | Admitting: Family Medicine

## 2010-10-08 LAB — CONVERTED CEMR LAB
Bilirubin Urine: NEGATIVE
Blood in Urine, dipstick: NEGATIVE
Chlamydia, Swab/Urine, PCR: NEGATIVE
GC Probe Amp, Urine: NEGATIVE
Glucose, Urine, Semiquant: 500
Ketones, urine, test strip: NEGATIVE
Nitrite: NEGATIVE
Protein, U semiquant: >=300
Specific Gravity, Urine: 1.025
Urobilinogen, UA: 0.2
WBC Urine, dipstick: NEGATIVE
pH: 6.5

## 2010-10-18 ENCOUNTER — Encounter: Payer: Self-pay | Admitting: Sports Medicine

## 2010-10-21 ENCOUNTER — Ambulatory Visit: Payer: Self-pay | Admitting: Family Medicine

## 2010-10-21 ENCOUNTER — Encounter: Payer: Self-pay | Admitting: Sports Medicine

## 2010-10-21 LAB — CONVERTED CEMR LAB: Direct LDL: 93 mg/dL

## 2010-11-01 ENCOUNTER — Encounter: Payer: Self-pay | Admitting: Sports Medicine

## 2010-11-12 ENCOUNTER — Ambulatory Visit: Payer: Self-pay | Admitting: Family Medicine

## 2010-11-15 ENCOUNTER — Encounter: Payer: Self-pay | Admitting: Emergency Medicine

## 2010-11-15 ENCOUNTER — Encounter: Payer: Self-pay | Admitting: Family Medicine

## 2010-11-15 ENCOUNTER — Inpatient Hospital Stay (HOSPITAL_COMMUNITY): Admission: EM | Admit: 2010-11-15 | Discharge: 2010-11-17 | Payer: Self-pay | Admitting: Family Medicine

## 2010-11-15 DIAGNOSIS — J029 Acute pharyngitis, unspecified: Secondary | ICD-10-CM | POA: Insufficient documentation

## 2010-11-17 ENCOUNTER — Emergency Department (HOSPITAL_COMMUNITY): Admission: EM | Admit: 2010-11-17 | Discharge: 2010-11-17 | Payer: Self-pay | Admitting: Emergency Medicine

## 2010-11-22 ENCOUNTER — Telehealth: Payer: Self-pay | Admitting: Sports Medicine

## 2010-11-25 ENCOUNTER — Ambulatory Visit: Payer: Self-pay | Admitting: Family Medicine

## 2010-11-25 ENCOUNTER — Encounter: Payer: Self-pay | Admitting: Sports Medicine

## 2010-11-25 LAB — CONVERTED CEMR LAB
BUN: 25 mg/dL — ABNORMAL HIGH (ref 6–23)
CO2: 26 meq/L (ref 19–32)
Calcium: 8.3 mg/dL — ABNORMAL LOW (ref 8.4–10.5)
Chloride: 105 meq/L (ref 96–112)
Creatinine, Ser: 3.09 mg/dL — ABNORMAL HIGH (ref 0.40–1.50)
Glucose, Bld: 160 mg/dL — ABNORMAL HIGH (ref 70–99)
Potassium: 4.2 meq/L (ref 3.5–5.3)
Sodium: 136 meq/L (ref 135–145)

## 2010-11-26 ENCOUNTER — Encounter: Payer: Self-pay | Admitting: Sports Medicine

## 2010-12-21 ENCOUNTER — Telehealth: Payer: Self-pay | Admitting: Family Medicine

## 2011-01-06 ENCOUNTER — Ambulatory Visit: Admission: RE | Admit: 2011-01-06 | Discharge: 2011-01-06 | Payer: Self-pay | Source: Home / Self Care

## 2011-01-06 ENCOUNTER — Encounter: Payer: Self-pay | Admitting: Sports Medicine

## 2011-01-06 DIAGNOSIS — H6120 Impacted cerumen, unspecified ear: Secondary | ICD-10-CM | POA: Insufficient documentation

## 2011-01-06 LAB — CONVERTED CEMR LAB
Amphetamine Screen, Ur: NEGATIVE
BUN: 31 mg/dL — ABNORMAL HIGH (ref 6–23)
Barbiturate Quant, Ur: NEGATIVE
Benzodiazepines.: NEGATIVE
CO2: 27 meq/L (ref 19–32)
Calcium: 8.8 mg/dL (ref 8.4–10.5)
Chloride: 100 meq/L (ref 96–112)
Cocaine Metabolites: NEGATIVE
Creatinine, Ser: 4.41 mg/dL — ABNORMAL HIGH (ref 0.40–1.50)
Creatinine,U: 101.3 mg/dL
Ethyl Alcohol: <10 mg/dL (ref <10)
Glucose, Bld: 404 mg/dL — ABNORMAL HIGH (ref 70–99)
Marijuana Metabolite: POSITIVE — AB
Methadone: NEGATIVE
Opiate Screen, Urine: NEGATIVE
Phencyclidine (PCP): NEGATIVE
Potassium: 3.9 meq/L (ref 3.5–5.3)
Propoxyphene: NEGATIVE
Sodium: 135 meq/L (ref 135–145)

## 2011-01-07 ENCOUNTER — Telehealth: Payer: Self-pay | Admitting: Family Medicine

## 2011-01-21 ENCOUNTER — Encounter: Payer: Self-pay | Admitting: Sports Medicine

## 2011-01-21 ENCOUNTER — Ambulatory Visit: Admission: RE | Admit: 2011-01-21 | Discharge: 2011-01-21 | Payer: Self-pay | Source: Home / Self Care

## 2011-01-21 LAB — CONVERTED CEMR LAB
Amphetamine Screen, Ur: NEGATIVE
BUN: 31 mg/dL — ABNORMAL HIGH (ref 6–23)
Barbiturate Quant, Ur: NEGATIVE
Benzodiazepines.: NEGATIVE
CO2: 25 meq/L (ref 19–32)
Calcium: 8.5 mg/dL (ref 8.4–10.5)
Chloride: 100 meq/L (ref 96–112)
Cocaine Metabolites: NEGATIVE
Creatinine, Ser: 4.44 mg/dL — ABNORMAL HIGH (ref 0.40–1.50)
Creatinine,U: 85.2 mg/dL
Direct LDL: 116 mg/dL — ABNORMAL HIGH
Glucose, Bld: 417 mg/dL — ABNORMAL HIGH (ref 70–99)
Marijuana Metabolite: POSITIVE — AB
Methadone: NEGATIVE
Opiates: NEGATIVE
Phencyclidine (PCP): NEGATIVE
Potassium: 4.3 meq/L (ref 3.5–5.3)
Propoxyphene: NEGATIVE
Sodium: 136 meq/L (ref 135–145)

## 2011-01-24 ENCOUNTER — Telehealth: Payer: Self-pay | Admitting: Sports Medicine

## 2011-01-25 NOTE — Assessment & Plan Note (Signed)
Summary: F/U PER DR T/KH   Vital Signs:  Patient profile:   35 year old male Height:      73 inches Temp:     98.8 degrees F oral Pulse rate:   99 / minute BP sitting:   137 / 90  (right arm) Cuff size:   regular  Vitals Entered By: Schuyler Amor CMA (September 16, 2010 9:41 AM) CC: F/U Is Patient Diabetic? Yes Pain Assessment Patient in pain? yes     Location: left leg Intensity: 8   Primary Care Provider:  Aundria Mems MD  CC:  F/U.  History of Present Illness: 35 yo male DM1 here for fu.  I&D of L leg abscess done 09/13/10.  Grew MSSA.  Still with some packing left. Occasional chills.  Tender.  No spreading of infection.  Drainage minimal.    Also with anxiety, stress, depressed mood, PHQ-9: 11, somewhat difficult.  has tried Lexapro and Paxil in the past without improvement.  Habits & Providers  Alcohol-Tobacco-Diet     Tobacco Status: never  Current Medications (verified): 1)  Toprol Xl 200 Mg Xr24h-Tab (Metoprolol Succinate) .... Two Tabs By Mouth Daily 2)  Novolog Flexpen 100 Unit/ml  Soln (Insulin Aspart) .... Inject 10 Units Prior To 3 Meals Per Day If Pre-Meal Blood Glucose Is Higher Than 120 3)  Lantus Solostar 100 Unit/ml  Soln (Insulin Glargine) .... 60 Units Inj Subcutaneously Qhs 4)  Bd U/f Short Pen Needle 31g X 8 Mm  Misc (Insulin Pen Needle) .... Dispense One Box - Patient Doing 5 Shots Daily.  Dispense 1 Box 5)  Percocet 5-325 Mg Tabs (Oxycodone-Acetaminophen) .[name].. 1 Tab By Mouth Q 6 Hours As Needed Pain. 6)  Lisinopril-Hydrochlorothiazide 20-25 Mg Tabs (Lisinopril-Hydrochlorothiazide) .... One Tab By Mouth Daily 7)  Pravastatin Sodium 20 Mg Tabs (Pravastatin Sodium) .... One Tab By Mouth Daily 8)  Aspirin 81 Mg Tbec (Aspirin) .... One Tab By Mouth Daily 9)  Prodigy Lancets 28g  Misc (Lancets) .... To Be Used Qac and Hs For Insulin Dosing. 10)  Prodigy Onetouch Ultra Meter .... Use For Glucose Monitoring 11)  Zofran 4 Mg Tabs (Ondansetron  Hcl) .... One Tab By Mouth Q4h As Needed For Nausea 12)  Ciprodex 0.3-0.1 % Susp (Ciprofloxacin-Dexamethasone) .... 4 Drops in Each Ear Two Times A Day X 7 Days 13)  Doxycycline Hyclate 100 Mg Caps (Doxycycline Hyclate) .... One Cap By Mouth Two Times A Day X 10 Day 14)  Wellbutrin 100 Mg Tabs (Bupropion Hcl) .... One Tab By Mouth Two Times A Day X 3d Then Increase To Three Times A Day  Allergies (verified): No Known Drug Allergies  Social History: Smoking Status:  never  Review of Systems       See HPI  Physical Exam  General:  Well-developed,well-nourished,in no acute distress; alert,appropriate and cooperative throughout examination Extremities:  Incision still open, tail of iodoform still in wound.  pulled out approx 4 inches of packing.  Still with some left.  No induration, not erythematous. Still somewhat edematous.  TTP. Additional Exam:  PHQ9: 11, somewhat difficult.   Impression & Recommendations:  Problem # 1:  CELLULITIS AND ABSCESS OF LEG EXCEPT FOOT (ICD-682.6) One more course of doxy as still infected. Infection does not appear to be spreading. Pt to remove the rest of the packing in 2d. RTC as needed.  His updated medication list for this problem includes:    Doxycycline Hyclate 100 Mg Caps (Doxycycline hyclate) ..... One cap by mouth  two times a day x 10 day  Orders: [place]- Est Level  3 [id number])  Problem # 2:  DEPRESSION, MAJOR, RECURRENT (ICD-296.30) Assessment: Deteriorated PHQ-9 suggestive of moderate depression. Failed 2 SSRIs. Wellbutrin 100 three times a day.  RTC 1 month to see how he is doing. No SI/HI.  Complete Medication List: 1)  Toprol Xl 200 Mg Xr24h-tab (Metoprolol succinate) .... Two tabs by mouth daily 2)  Novolog Flexpen 100 Unit/ml Soln (Insulin aspart) .... Inject 10 units prior to 3 meals per day if pre-meal blood glucose is higher than 120 3)  Lantus Solostar 100 Unit/ml Soln (Insulin glargine) .... 60 units inj subcutaneously qhs 4)   Bd U/f Short Pen Needle 31g X 8 Mm Misc (Insulin pen needle) .... Dispense one box - patient doing 5 shots daily.  dispense 1 box 5)  Percocet 5-325 Mg Tabs (Oxycodone-acetaminophen) .[name].. 1 tab by mouth q 6 hours as needed pain. 6)  Lisinopril-hydrochlorothiazide 20-25 Mg Tabs (Lisinopril-hydrochlorothiazide) .... One tab by mouth daily 7)  Pravastatin Sodium 20 Mg Tabs (Pravastatin sodium) .... One tab by mouth daily 8)  Aspirin 81 Mg Tbec (Aspirin) .... One tab by mouth daily 9)  Prodigy Lancets 28g Misc (Lancets) .... To be used qac and hs for insulin dosing. 10)  Prodigy Onetouch Ultra Meter  .... Use for glucose monitoring 11)  Zofran 4 Mg Tabs (Ondansetron hcl) .... One tab by mouth q4h as needed for nausea 12)  Ciprodex 0.3-0.1 % Susp (Ciprofloxacin-dexamethasone) .... 4 drops in each ear two times a day x 7 days 13)  Doxycycline Hyclate 100 Mg Caps (Doxycycline hyclate) .... One cap by mouth two times a day x 10 day 14)  Wellbutrin 100 Mg Tabs (Bupropion hcl) .... One tab by mouth two times a day x 3d then increase to three times a day  Patient Instructions: 1)  Great to see you, 2)  Will do one more course of doxycycline. 3)  Pull out the rest of the packing in 2 days. 4)  Welbutrin as rx'ed below. 5)  Come back to see me in 1 month to see how your stress is doing on the new medication. 6)  -Dr. Darene Lamer. Prescriptions: WELLBUTRIN 100 MG TABS (BUPROPION HCL) One tab by mouth two times a day x 3d then increase to three times a day  #90 x 0   Entered and Authorized by:   Aundria Mems MD   Signed by:   Aundria Mems MD on 09/16/2010   Method used:   Electronically to        [facility] [phone]* (retail)       [place], [place]  [address]       Ph: [phone] or [phone]       Fax: [phone]   RxID:   [id number] DOXYCYCLINE HYCLATE 100 MG CAPS (DOXYCYCLINE HYCLATE) One cap by mouth two times a day x 10 day  #20 x 0   Entered and  Authorized by:   Aundria Mems MD   Signed by:   Aundria Mems MD on 09/16/2010   Method used:   Electronically to        [facility]  [address] [phone][facility] (retail)       [address]       [place], [place]  [address]       Ph: [phone] or [phone]       Fax: [phone]  [id number]:[id number]

## 2011-01-25 NOTE — Progress Notes (Signed)
Summary: Rx Req  Phone Note Refill Request Call back at [phone] Message from:  Patient  Refills Requested: Medication #1:  HYDROCHLOROTHIAZIDE 25 MG TABS One tab by mouth daily  Medication #2:  PERCOCET 5-325 MG TABS 1 tab by mouth q 6 hours as needed pain. PT IS OUT AND SAYS THERE WAS TO BE A CHANGE IN HOW HE TAKE THIS MED.  PT USES [facility].  Initial call taken by: Raymond Gurney,  January 07, 2010 12:00 PM  Follow-up for Phone Call        wil;l forward to MD. Follow-up by: Marcell Barlow RN,  January 07, 2010 12:26 PM  Additional Follow-up for Phone Call Additional follow up Details #1::        pt has been out and needs this asap- dr is going out of country Additional Follow-up by: Audie Clear,  January 08, 2010 4:37 PM    Additional Follow-up for Phone Call Additional follow up Details #2::    pt called again today wanting to know about his presciptions- not sure what to tell him Follow-up by: Audie Clear,  January 11, 2010 2:15 PM  Additional Follow-up for Phone Call Additional follow up Details #3:: Details for Additional Follow-up Action Taken: to pcp Additional Follow-up by: Elige Radon RN,  January 11, 2010 2:26 PM    Prescriptions: PERCOCET 5-325 MG TABS (OXYCODONE-ACETAMINOPHEN) 1 tab by mouth q 6 hours as needed pain.  #30 x 0   Entered and Authorized by:   Aundria Mems MD   Signed by:   Aundria Mems MD on 01/11/2010   Method used:   Print then Give to Patient   RxID:   [id number] HYDROCHLOROTHIAZIDE 25 MG TABS (HYDROCHLOROTHIAZIDE) One tab by mouth daily  #90 x 11   Entered and Authorized by:   Aundria Mems MD   Signed by:   Aundria Mems MD on 01/11/2010   Method used:   Print then Give to Patient   RxID:   [id number]  pt notified.Elige Radon RN  January 11, 2010 3:02 PM

## 2011-01-25 NOTE — Progress Notes (Signed)
 Summary: triage  Phone Note Call from Patient   Summary of Call: stomach aches Initial call taken by: Karna Seminole,  December 17, 2009 12:11 PM  Follow-up for Phone Call        c/o stomach & entire body aching since he started stain one wk ago. advised him to stop until he hears back from md next week. reviewed dietary sources of fats. he likes beans & vegt. encouraged xercise as well. to go to ED if he feels much worse. may take tylenol  or ibu for the aching. he agreed with plan Follow-up by: Ginnie Mau RN,  December 17, 2009 12:13 PM  Additional Follow-up for Phone Call Additional follow up Details #1::        Agreed, thanks, stop zocor , lets switch to Pravastatin as less myalgias with this.  Starting at 20mg , have [place] call back if he gets muscle aches with this one. Additional Follow-up by: Debby Petties MD,  December 17, 2009 6:37 PM    Additional Follow-up for Phone Call Additional follow up Details #2::    tried all numbers in chart. none in working order. will wait for him to call us  back Follow-up by: Ginnie Mau RN,  December 21, 2009 12:03 PM  Additional Follow-up for Phone Call Additional follow up Details #3:: Details for Additional Follow-up Action Taken: Noted, was a message left for him? Additional Follow-up by: Debby Petties MD,  December 21, 2009 12:06 PM  New/Updated Medications: PRAVASTATIN SODIUM 20 MG TABS (PRAVASTATIN SODIUM) One tab by mouth daily Prescriptions: PRAVASTATIN SODIUM 20 MG TABS (PRAVASTATIN SODIUM) One tab by mouth daily  #30 x 0   Entered and Authorized by:   Debby Petties MD   Signed by:   Debby Petties MD on 12/17/2009   Method used:   Faxed to ...       [facility] (retail)       [address]       [address]       [place], [place]  [address]       Ph: [phone]       Fax: [phone]   RxID:   [id number]  could not leave a message as none of the numbers are working. letter sent.Raejean Mau RN  December 21, 2009 12:14 PM  Thanks, Debby Petties MD  December 21, 2009 12:25 PM

## 2011-01-25 NOTE — Assessment & Plan Note (Signed)
Summary: n & v, diarrhea/chest pain, hyperglycemia   Vital Signs:  Patient profile:   35 year old male Weight:      170 pounds Temp:     98.8 degrees F Pulse rate:   95 / minute BP sitting:   111 / 75  (right arm) Cuff size:   regular  Vitals Entered By: Audelia Hives CMA (December 30, 2009 11:10 AM)  Primary Care Provider:  Aundria Mems MD  CC:  Diarrhea and Vomiting s/p Chinese food, Chest tingling, and Hypeglycemia at home.  History of Present Illness: Diarrhea and Vomiting: Pt ate [place] food Monday night and has been nauseated with vomiting and diarrhea since Tuesday morning. He has been able to keep down Ginger Aile and Tea. He says he has seen the [place] food in his vomit. It was chicken and rice. He had some sweating yesterday and this morning.   Chest pain: Pt says he has had chest "tingling" that comes and goes. It is not associated with anything else, it lasts 10 sec and then is gone. He recenly was in the hospital on 12/04/09 - thru 12/05/09 admitted for chest pain and had a clean cardiac cath.   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 He says that yesterday his meter read "high" and he has had several other high readings. however, this morning it was 69 on his meter.     Current Medications (verified): 1)  Benazepril Hcl 20 Mg Tabs (Benazepril Hcl) .... Take 1 Tablet By Mouth Once A Day 2)  Toprol Xl 200 Mg Xr24h-Tab (Metoprolol Succinate) .... Two Tabs By Mouth Daily 3)  Novolog Flexpen 100 Unit/ml  Soln (Insulin Aspart) .... Inject 10 Units Prior To 3 Meals Per Day If Pre-Meal Blood Glucose Is Higher Than 120 4)  Lantus Solostar 100 Unit/ml  Soln (Insulin Glargine) .... 30 Units Daily 5)  Bd U/f Short Pen Needle 31g X 8 Mm  Misc (Insulin Pen Needle) .... Dispense One Box - Patient Doing 5 Shots Daily.  Dispense 1 Box 6)  Percocet 5-325 Mg Tabs (Oxycodone-Acetaminophen) .[name].. 1 Tab By Mouth Q 6 Hours As Needed Pain. 7)   Amlodipine Besylate 10 Mg Tabs (Amlodipine Besylate) .... One Tab By Mouth Daily 8)  Hydrochlorothiazide 25 Mg Tabs (Hydrochlorothiazide) .... One Tab By Mouth Daily 9)  Pravastatin Sodium 20 Mg Tabs (Pravastatin Sodium) .... One Tab By Mouth Daily 10)  Aspirin 81 Mg Tbec (Aspirin) .... One Tab By Mouth Daily 11)  Prodigy Lancets 28g  Misc (Lancets) .... To Be Used Qac and Hs For Insulin Dosing.  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  Previous use MJ, 2 "blunts" or "cigars" a day.  Denies ETOH use. Pt lives in an apartment with his mother Per past hospital chart, occasional crack/cocaine history but pt denies this.   Review of Systems        vitals reviewed and pertinent negatives and positives seen in HPI   Physical Exam  General:  Well-developed,well-nourished,in no acute distress; alert,appropriate and cooperative throughout examination Mouth:  Oral mucosa and oropharynx without lesions or exudates.  dentures. mucus membranes moist Heart:  Normal  rate and regular rhythm. S1 and S2 normal without gallop, murmur, click, rub or other extra sounds. Psych:  depressed affect and subdued.  depressed affect and subdued.     Impression & Recommendations:  Problem # 1:  DIARRHEA (ICD-787.91) Assessment New Pt has some kind of food poisoning as per his description. He is continuing to drink ginger ale and tea. Encouraged to drink gatorade and given precautions about dehydration.   Orders: Glucose Cap-[facility] [id number]) [place]- Est  Level 4 [id number])  Problem # 2:  CHEST PAIN (ICD-786.50) Assessment: Deteriorated Pt says he is having chest "tingling" lasting 10 sec not associated  with exertion. EKG is unchanged from when he was in the hospital 12-04-09.   Orders: 12 Lead EKG (12 Lead EKG) [facility]- Est  Level 4 [id number])  Problem # 3:  HYPERGLYCEMIA (ICD-790.29) Assessment: Deteriorated Pt had a "high" reading on his meter yesterday and had some diaphoresis. concern for hypeglycemia out of control. Pt has glucose of 243 in our clinic. He has Lanus and Novolog. He will continue to use current regimine.   His updated medication list for this problem includes:    Novolog Flexpen 100 Unit/ml Soln (Insulin aspart) ..... Inject 10 units prior to 3 meals per day if pre-meal blood glucose is higher than 120    Lantus Solostar 100 Unit/ml Soln (Insulin glargine) .Marland KitchenMarland KitchenMarland KitchenMarland Kitchen 30 units daily  Orders: [place]- Est  Level 4 ([id number])  Complete Medication List: 1)  Benazepril Hcl 20 Mg Tabs (Benazepril hcl) .... Take 1 tablet by mouth once a day 2)  Toprol Xl 200 Mg Xr24h-tab (Metoprolol succinate) .... Two tabs by mouth daily 3)  Novolog Flexpen 100 Unit/ml Soln (Insulin aspart) .... Inject 10 units prior to 3 meals per day if pre-meal blood glucose is higher than 120 4)  Lantus Solostar 100 Unit/ml Soln (Insulin glargine) .... 30 units daily 5)  Bd U/f Short Pen Needle 31g X 8 Mm Misc (Insulin pen needle) .... Dispense one box - patient doing 5 shots daily.  dispense 1 box 6)  Percocet 5-325 Mg Tabs (Oxycodone-acetaminophen) .[name].. 1 tab by mouth q 6 hours as needed pain. 7)  Amlodipine Besylate 10 Mg Tabs (Amlodipine besylate) .... One tab by mouth daily 8)  Hydrochlorothiazide 25 Mg Tabs (Hydrochlorothiazide) .... One tab by mouth daily 9)  Pravastatin Sodium 20 Mg Tabs (Pravastatin sodium) .... One tab by mouth daily 10)  Aspirin 81 Mg Tbec (Aspirin) .... One tab by mouth daily 11)  Prodigy Lancets 28g Misc (Lancets) .... To be used qac and hs for insulin dosing.  Patient Instructions: 1)  Wash your hands after every time you are in the bathroom.  2)  Drink Gatorade in small sips all day to  rehydrate yourself.  3)  This will just have to run it's couse. You should feel better in 24-48 hours. If you are not better come back in or go to the ER. If you are losing lots of fluids and getting dehydrated you should go to the ER. 4)  You EKG is stable compared to your last EKG.

## 2011-01-25 NOTE — Progress Notes (Signed)
 Summary: Triage  Phone Note Call from Patient Call back at [phone]   Summary of Call: pt states it looks like boils on his face.  And they also hurt.  Can he be seen. Initial call taken by: Madelin Daring,  June 09, 2009 1:48 PM  Follow-up for Phone Call        boils on face x few days painful. no meds in home. difficult to eat. appt at 4:15 today with Dr. Jenetta Follow-up by: Ginnie Mau RN,  June 09, 2009 1:50 PM  Additional Follow-up for Phone Call Additional follow up Details #1::        Thanks. Additional Follow-up by: Debby Petties MD,  June 10, 2009 4:04 PM

## 2011-01-25 NOTE — Consult Note (Signed)
Summary: [facility]   Imported By: Sallee Provencal 01/08/2010 16:28:38  _____________________________________________________________________  External Attachment:    Type:   Image     Comment:   External Document

## 2011-01-25 NOTE — Progress Notes (Signed)
Summary: Rx Req  Phone Note Refill Request Call back at [phone] Message from:  Patient  Refills Requested: Medication #1:  PERCOCET 5-325 MG TABS 1 tab by mouth q 6 hours as needed pain. PLEASE CALL PT WHEN READY TO PICK UP. pt has been up all night and needs something for pain asap  Initial call taken by: Raymond Gurney,  March 03, 2010 3:02 PM  Follow-up for Phone Call        to pcp Follow-up by: Elige Radon RN,  March 04, 2010 9:59 AM  Additional Follow-up for Phone Call Additional follow up Details #1::        pt wants to know if we can page Dr. Dianah Field and get his rx. Additional Follow-up by: Raymond Gurney,  March 05, 2010 11:09 AM    Additional Follow-up for Phone Call Additional follow up Details #2::    pcp is on vacation. will ask a preceptor Follow-up by: Elige Radon RN,  March 05, 2010 11:11 AM  Additional Follow-up for Phone Call Additional follow up Details #3:: Details for Additional Follow-up Action Taken: Handwritten Rx Additional Follow-up by: Madison Hickman MD,  March 05, 2010 11:15 AM  Prescriptions: PERCOCET 5-325 MG TABS (OXYCODONE-ACETAMINOPHEN) 1 tab by mouth q 6 hours as needed pain.  #30 x 0   Entered by:   Madison Hickman MD   Authorized by:   [name] Triage [facility]   Signed by:   Madison Hickman MD on 03/05/2010   Method used:   Handwritten   [id number]  notified pt. he will come get it.Elige Radon RN  March 05, 2010 11:18 AM

## 2011-01-25 NOTE — Miscellaneous (Signed)
  Clinical Lists Changes  Problems: Changed problem from DIABETES MELLITUS, I (ICD-250.01) to DIABETES MELLITUS, TYPE I, UNCONTROLLED, WITH COMPLICATIONS (ICD-250.93)  Appended Document: Orders Update    Clinical Lists Changes  Orders: Added new Test order of Basic Met-[facility] [phone]) - Signed

## 2011-01-25 NOTE — Miscellaneous (Signed)
Summary: PROCEDURE CONSENT  PROCEDURE CONSENT   Imported By: Audie Clear 09/07/2010 09:17:51  _____________________________________________________________________  External Attachment:    Type:   Image     Comment:   External Document

## 2011-01-25 NOTE — Assessment & Plan Note (Signed)
 Summary: HTN & Meds check/[place]   Vital Signs:  Patient profile:   35 year old male Height:      73 inches Weight:      185.7 pounds Temp:     98.7 degrees F oral Pulse rate:   91 / minute BP sitting:   157 / 97  (right arm) Cuff size:   regular  Vitals Entered By: Olam Keeling (December 07, 2009 1:29 PM) CC: F/U HTN and meds Is Patient Diabetic? Yes Did you bring your meter with you today? No Pain Assessment Patient in pain? no        Serial Vital Signs/Assessments:  Time      Position  BP       Pulse  Resp  Temp     By 1:30 PM             184/96                         Olam Keeling  Comments: 1:30 PM Re check manually By: Olam Keeling    Primary Care Provider:  Debby Petties MD  CC:  F/U HTN and meds.  History of Present Illness: 68y M with IDDM, HTN, HLD, HFU.  IDDM:  Has been using lantus  30 units qHS.  CBGs were labile but controlled in hospital on home regimen.  Is not checking his sugars at home because he says he has no money to buy lancets.  Refills have been sent to pharmacy.  We have no samples in the [facility].  HTN:  Still elevated, well controlled in the hospital.  ACE held 2/2 increased creatinine, not to be restarted until creatinine improved.  Otherwise he says he is taking his meds as RXed.  HLD:  Taking Zocor  as RX'ed.  Habits & Providers  Alcohol-Tobacco-Diet     Tobacco Status: never  Current Medications (verified): 1)  Benazepril Hcl 20 Mg Tabs (Benazepril Hcl) .... Take 1 Tablet By Mouth Once A Day 2)  Toprol  Xl 200 Mg Xr24h-Tab (Metoprolol  Succinate) .... Two Tabs By Mouth Daily 3)  Novolog  Flexpen 100 Unit/ml  Soln (Insulin  Aspart) .... Inject 10 Units Prior To 3 Meals Per Day If Pre-Meal Blood Glucose Is Higher Than 120 4)  Lantus  Solostar 100 Unit/ml  Soln (Insulin  Glargine) .... 30 Units Daily 5)  Bd U/f Short Pen Needle 31g X 8 Mm  Misc (Insulin  Pen Needle) .... Dispense One Box - Patient Doing 5 Shots Daily.  Dispense 1 Box 6)   Percocet 5-325 Mg Tabs (Oxycodone -Acetaminophen ) .SABRA.. 1 Tab By Mouth Q 6 Hours As Needed Pain. 7)  Amlodipine  Besylate 10 Mg Tabs (Amlodipine  Besylate) .... One Tab By Mouth Daily 8)  Hydrochlorothiazide  25 Mg Tabs (Hydrochlorothiazide ) .... One Tab By Mouth Daily 9)  Zocor  40 Mg Tabs (Simvastatin ) .... One Tab By Mouth Daily 10)  Aspirin  81 Mg Tbec (Aspirin ) .... One Tab By Mouth Daily  Allergies (verified): No Known Drug Allergies  Social History: Smoking Status:  never  Review of Systems       See HPI  Physical Exam  General:  Well-developed,well-nourished,in no acute distress; alert,appropriate and cooperative throughout examination Lungs:  Normal respiratory effort, chest expands symmetrically. Lungs are clear to auscultation, no crackles or wheezes. Heart:  Normal rate and regular rhythm. S1 and S2 normal without gallop, murmur, click, rub or other extra sounds. Extremities:  B/L BKA   Impression & Recommendations:  Problem # 1:  RENAL INSUFFICIENCY (  ICD-588.9) Assessment Unchanged Checking BMET today, can restart Benazepril if Creat <1.5.  Likely 2/2 hypertensive nephropathy. Orders: [facility]- Est  Level 4 (00785) Basic Met-[facility] (19951-77089)  Problem # 2:  HYPERTENSION, BENIGN SYSTEMIC (ICD-401.1) Assessment: Unchanged BP still elevated.  Pt says he is taking his meds.  Increase HCTZ to 25 once daily, increase toprol  XL to 400 once daily.  Will restart benazepril if Creat <1.5.  His pulse of 91 makes me think he is not compliant with the B-blocker, his high BP when it was controlled in the hospital on the same regimen makes me think he is not compliant with the rest of his BP meds either.  The following medications were removed from the medication list:    Caduet 10-40 Mg Tabs (Amlodipine -atorvastatin ) ..... One tab by mouth qday His updated medication list for this problem includes:    Benazepril Hcl 20 Mg Tabs (Benazepril hcl) .SABRA... Take 1 tablet by mouth once a day     Toprol  Xl 200 Mg Xr24h-tab (Metoprolol  succinate) .SABRA..SABRA Two tabs by mouth daily    Amlodipine  Besylate 10 Mg Tabs (Amlodipine  besylate) ..... One tab by mouth daily    Hydrochlorothiazide  25 Mg Tabs (Hydrochlorothiazide ) ..... One tab by mouth daily  Orders: [facility]- Est  Level 4 (00785)  Problem # 3:  HYPERLIPIDEMIA (ICD-272.4) No changes at this time.  FLP not at goal but close in the hospital.  The following medications were removed from the medication list:    Caduet 10-40 Mg Tabs (Amlodipine -atorvastatin ) ..... One tab by mouth qday His updated medication list for this problem includes:    Zocor  40 Mg Tabs (Simvastatin ) ..... One tab by mouth daily  Problem # 4:  DIABETES MELLITUS, I (ICD-250.01) Pt will start checking sugars when he gets his lancets.  Lantus  decreased to 30 units in the hospital.   Lancets refilled.  His updated medication list for this problem includes:    Benazepril Hcl 20 Mg Tabs (Benazepril hcl) .SABRA... Take 1 tablet by mouth once a day    Novolog  Flexpen 100 Unit/ml Soln (Insulin  aspart) ..... Inject 10 units prior to 3 meals per day if pre-meal blood glucose is higher than 120    Lantus  Solostar 100 Unit/ml Soln (Insulin  glargine) .SABRASABRASABRASABRA 30 units daily    Aspirin  81 Mg Tbec (Aspirin ) ..... One tab by mouth daily  Orders: [facility]- Est  Level 4 (00785)  Complete Medication List: 1)  Benazepril Hcl 20 Mg Tabs (Benazepril hcl) .... Take 1 tablet by mouth once a day 2)  Toprol  Xl 200 Mg Xr24h-tab (Metoprolol  succinate) .... Two tabs by mouth daily 3)  Novolog  Flexpen 100 Unit/ml Soln (Insulin  aspart) .... Inject 10 units prior to 3 meals per day if pre-meal blood glucose is higher than 120 4)  Lantus  Solostar 100 Unit/ml Soln (Insulin  glargine) .... 30 units daily 5)  Bd U/f Short Pen Needle 31g X 8 Mm Misc (Insulin  pen needle) .... Dispense one box - patient doing 5 shots daily.  dispense 1 box 6)  Percocet 5-325 Mg Tabs (Oxycodone -acetaminophen ) .SABRA.. 1 tab by mouth q 6  hours as needed pain. 7)  Amlodipine  Besylate 10 Mg Tabs (Amlodipine  besylate) .... One tab by mouth daily 8)  Hydrochlorothiazide  25 Mg Tabs (Hydrochlorothiazide ) .... One tab by mouth daily 9)  Zocor  40 Mg Tabs (Simvastatin ) .... One tab by mouth daily 10)  Aspirin  81 Mg Tbec (Aspirin ) .... One tab by mouth daily 11)  Prodigy Lancets 28g Misc (Lancets) .... To  be used qac and hs for insulin  dosing.  Patient Instructions: 1)  Sorry, no lancet samples. 2)  Get the lancets when you have money,  3)  Increase Toprol  XL to 400mg  daily (take four-100mg  tabs daily until you run out, then I will refill with 200mg  tabs) 4)  increase HCTZ to two 12.5mg  tabs daily (25mg  total dose) then call when you run out and I can refill with 25mg  tabs. 5)  BMET. 6)  Will call you if you can restart Benzepril. 7)  Come back to see me in 2 weeks to recheck your blood pressures. 8)  -Dr. ONEIDA. Prescriptions: PRODIGY LANCETS 28G  MISC (LANCETS) To be used qAC and HS for insulin  dosing.  #1 box x 11   Entered and Authorized by:   Debby Petties MD   Signed by:   Debby Petties MD on 12/07/2009   Method used:   Faxed to ...       [facility] (retail)       [address]       [address]       [place], [place]  [address]       Ph: [phone]       Fax: [phone]   RxID:   [id number]      Prevention & Chronic Care Immunizations   Influenza vaccine: given  (12/07/2009)   Influenza vaccine due: 12/07/2010    Tetanus booster: 05/26/1998: Done.   Tetanus booster due: 05/26/2008    Pneumococcal vaccine: Done.  (11/26/1999)   Pneumococcal vaccine due: None  Other Screening   Smoking status: never  (12/07/2009)  Diabetes Mellitus   HgbA1C: 11.6  (11/30/2009)   Hemoglobin A1C due: 01/15/2009    Eye exam: Not documented    Foot exam: Not documented   Foot exam action/deferral: Not indicated   High risk foot: Not documented   Foot care education: Not documented   Foot exam due:  Not Indicated    Urine microalbumin/creatinine ratio: Not documented   Urine microalbumin action/deferral: Not indicated   Urine microalbumin/cr due: 12/01/2010    Diabetes flowsheet reviewed?: Yes   Progress toward A1C goal: Deteriorated  Lipids   Total Cholesterol: 205  (03/14/2007)   Lipid panel action/deferral: Not indicated   LDL: 116  (12/01/2009)   LDL Direct: Not documented   HDL: 41  (12/01/2009)   Triglycerides: 119  (03/14/2007)   Lipid panel due: 03/13/2008    SGOT (AST): 13  (03/14/2007)   SGPT (ALT): 16  (03/14/2007)   Alkaline phosphatase: 116  (03/14/2007)   Total bilirubin: 0.3  (03/14/2007)    Lipid flowsheet reviewed?: Yes   Progress toward LDL goal: Unchanged  Hypertension   Last Blood Pressure: 157 / 97  (12/07/2009)   Serum creatinine: 1.90  (11/16/2009)   Serum potassium 4.4  (11/16/2009)    Hypertension flowsheet reviewed?: Yes   Progress toward BP goal: Improved  Self-Management Support :   Personal Goals (by the next clinic visit) :     Personal A1C goal: 8  (11/16/2009)     Personal blood pressure goal: 130/80  (11/16/2009)     Personal LDL goal: 100  (11/16/2009)    Diabetes self-management support: Written self-care plan  (12/07/2009)   Diabetes care plan printed   Last diabetes self-management training by diabetes educator: completed    Hypertension self-management support: Written self-care plan  (12/07/2009)   Hypertension self-care plan printed.    Lipid self-management support: Written self-care plan  (12/07/2009)  Lipid self-care plan printed.  Last Flu Vaccine:  Fluvax Non-MCR (09/26/2008 12:23:28 PM) Flu Vaccine Result Date:  12/07/2009 Flu Vaccine Result:  given Flu Vaccine Next Due:  1 yr Last HDL:  31 (09/13/2009 2:39:09 PM) HDL Result Date:  12/01/2009 HDL Result:  41 HDL Next Due:  1 yr Last LDL:  64 (09/13/2009 2:39:09 PM) LDL Result Date:  12/01/2009 LDL Result:  116 LDL Next Due:  1 yr Diabetes Foot Check Next  Due:  Not Indicated Last HGBA1C:  10.9 (11/16/2009 2:39:09 PM) HGBA1C Result Date:  11/30/2009 HGBA1C Next Due:  3 mo Microalbumin Result Date:  12/01/2009 Microalbumin Result:  abnormal Microalbumin Next Due:  1 yr Foot Care Education Due:  Not Indicated Self-Mgt EDU Date:  12/07/2009 Self-Mgt EDU:  completed Self-Mgt EDU Next Due:  1 yr

## 2011-01-25 NOTE — Assessment & Plan Note (Signed)
Summary: pain in stump/[place]/T's   Vital Signs:  Patient profile:   35 year old male Weight:      163.6 pounds Temp:     98.7 degrees F oral Pulse rate:   86 / minute Pulse rhythm:   regular BP sitting:   199 / 115  (left arm) Cuff size:   regular  Vitals Entered By: Audelia Hives CMA (May 07, 2010 10:11 AM)  Serial Vital Signs/Assessments:  Time      Position  BP       Pulse  Resp  Temp     By 10:33 AM            204/118                        Audelia Hives CMA 10:54 AM            198/119                        Audelia Hives CMA  CC: pain in stump Is Patient Diabetic? Yes Did you bring your meter with you today? No Pain Assessment Patient in pain? yes      Intensity: 8 Type: heaviness Onset of pain  Chronic Comments pt states that his pain is sharp and dull   Primary Care Provider:  Aundria Mems MD  CC:  pain in stump.  History of Present Illness: stump pain- patient tripped by dog on chain. pain in R BKA stump since. "excruciating." was unable to walk on prosthetic limb yesterday. walking but with signficant limp. no pain medications at home.  HTN- BP elevated. denies headache, chest pain, palpitations, peripheral edema. states he is taking blood pressure medications. on toprol, hctz.   Habits & Providers  Alcohol-Tobacco-Diet     Tobacco Status: current     Tobacco Counseling: to quit use of tobacco products  Current Medications (verified): 1)  Toprol Xl 200 Mg Xr24h-Tab (Metoprolol Succinate) .... Two Tabs By Mouth Daily 2)  Novolog Flexpen 100 Unit/ml  Soln (Insulin Aspart) .... Inject 10 Units Prior To 3 Meals Per Day If Pre-Meal Blood Glucose Is Higher Than 120 3)  Lantus Solostar 100 Unit/ml  Soln (Insulin Glargine) .... 30 Units Daily 4)  Bd U/f Short Pen Needle 31g X 8 Mm  Misc (Insulin Pen Needle) .... Dispense One Box - Patient Doing 5 Shots Daily.  Dispense 1 Box 5)  Percocet 5-325 Mg Tabs (Oxycodone-Acetaminophen) .[name].. 1 Tab By Mouth Q 6 Hours  As Needed Pain. 6)  Hydrochlorothiazide 25 Mg Tabs (Hydrochlorothiazide) .... One Tab By Mouth Daily 7)  Pravastatin Sodium 20 Mg Tabs (Pravastatin Sodium) .... One Tab By Mouth Daily 8)  Aspirin 81 Mg Tbec (Aspirin) .... One Tab By Mouth Daily 9)  Prodigy Lancets 28g  Misc (Lancets) .... To Be Used Qac and Hs For Insulin Dosing.  Allergies (verified): No Known Drug Allergies  Social History: Smoking Status:  current  Physical Exam  General:  Well-developed,well-nourished,in no acute distress; alert,appropriate and cooperative throughout examination. vitals reviewed.  Mouth:  MMM Lungs:  Normal respiratory effort, chest expands symmetrically. Lungs are clear to auscultation, no crackles or wheezes. Heart:  Normal rate and regular rhythm. S1 and S2 normal without gallop, murmur, click, rub or other extra sounds. Extremities:  exquisite TTP of R BKA stump. no obvious bony deformities or protrusions.    Impression & Recommendations:  Problem # 1:  LEG PAIN, RIGHT (  ICD-729.5) Assessment New will check xray. refill of percocet provided.   Orders: Radiology other (Radiology Other) [facility]- Est  Level 4 [id number])  Problem # 2:  HYPERTENSION, BENIGN SYSTEMIC (ICD-401.1) Assessment: Deteriorated  possibly elevated 2/2 pain. will refill x1 week. f/u with PCP.   The following medications were removed from the medication list:    Benazepril Hcl 20 Mg Tabs (Benazepril hcl) .[name]... Take 1 tablet by mouth once a day    Amlodipine Besylate 10 Mg Tabs (Amlodipine besylate) ..... One tab by mouth daily His updated medication list for this problem includes:    Toprol Xl 200 Mg Xr24h-tab (Metoprolol succinate) .[name]..[name] Two tabs by mouth daily    Hydrochlorothiazide 25 Mg Tabs (Hydrochlorothiazide) ..... One tab by mouth daily  Orders: [facility]- Est  Level 4 [id number])  Patient Instructions: 1)  Follow up with Dr. Darene Lamer. in 1 week about blood pressure Prescriptions: PERCOCET 5-325 MG TABS  (OXYCODONE-ACETAMINOPHEN) 1 tab by mouth q 6 hours as needed pain.  #30 x 0   Entered and Authorized by:   Nancy Nordmann  MD   Signed by:   Nancy Nordmann  MD on 05/07/2010   Method used:   Print then Give to Patient   RxID:   [id number] PERCOCET 5-325 MG TABS (OXYCODONE-ACETAMINOPHEN) 1 tab by mouth q 6 hours as needed pain.  #30 x 0   Entered and Authorized by:   Nancy Nordmann  MD   Signed by:   Nancy Nordmann  MD on 05/07/2010   Method used:   Print then Give to Patient   RxID:   [id number]    Prevention & Chronic Care Immunizations   Influenza vaccine: given  (12/07/2009)   Influenza vaccine due: 12/07/2010    Tetanus booster: 05/26/1998: Done.   Tetanus booster due: 05/26/2008    Pneumococcal vaccine: Done.  (11/26/1999)   Pneumococcal vaccine due: None  Other Screening   Smoking status: current  (05/07/2010)  Diabetes Mellitus   HgbA1C: 11.6  (11/30/2009)   Hemoglobin A1C due: 01/15/2009    Eye exam: Not documented    Foot exam: Not documented   Foot exam action/deferral: Not indicated   High risk foot: Not documented   Foot care education: Not documented   Foot exam due: Not Indicated    Urine microalbumin/creatinine ratio: Not documented   Urine microalbumin action/deferral: Not indicated   Urine microalbumin/cr due: 12/01/2010  Lipids   Total Cholesterol: 205  (03/14/2007)   Lipid panel action/deferral: Not indicated   LDL: 116  (12/01/2009)   LDL Direct: Not documented   HDL: 41  (12/01/2009)   Triglycerides: 119  (03/14/2007)   Lipid panel due: 03/13/2008    SGOT (AST): 13  (03/14/2007)   SGPT (ALT): 16  (03/14/2007)   Alkaline phosphatase: 116  (03/14/2007)   Total bilirubin: 0.3  (03/14/2007)  Hypertension   Last Blood Pressure: 199 / 115  (05/07/2010)   Serum creatinine: 2.09  (12/07/2009)   Serum potassium 4.7  (12/07/2009)    Hypertension flowsheet reviewed?: Yes   Progress toward BP goal:  Deteriorated  Self-Management Support :   Personal Goals (by the next clinic visit) :     Personal A1C goal: 8  (11/16/2009)     Personal blood pressure goal: 130/80  (11/16/2009)     Personal LDL goal: 100  (11/16/2009)    Diabetes self-management support: Written self-care plan  (12/07/2009)   Last diabetes self-management training by diabetes educator: completed    Hypertension self-management support:  Written self-care plan  (12/07/2009)    Lipid self-management support: Written self-care plan  (12/07/2009)

## 2011-01-25 NOTE — Progress Notes (Signed)
  Phone Note Call from Patient   Caller: Patient Call For: [phone] Summary of Call: Patient's pharmacy still haven't received rxs from yesterday.  Please call and let pt know when sent Initial call taken by: Eusebio Friendly,  September 17, 2010 3:48 PM  Follow-up for Phone Call        Rx sent to wrong pharmacy.  Rx's called to correct pharmacy and pt informed. Follow-up by: Christen Bame CMA,  September 17, 2010 4:38 PM  Additional Follow-up for Phone Call Additional follow up Details #1::        Thanks! Additional Follow-up by: Aundria Mems MD,  September 17, 2010 5:23 PM

## 2011-01-25 NOTE — Miscellaneous (Signed)
  Clinical Lists Changes  Problems: Changed problem from RENAL INSUFFICIENCY (ICD-588.9) to CHRONIC KIDNEY DISEASE STAGE III (MODERATE) (ICD-585.3)

## 2011-01-25 NOTE — Assessment & Plan Note (Signed)
 Summary: diabetes ck,tcb   Vital Signs:  Patient profile:   35 year old male Height:      73 inches Weight:      167.9 pounds BMI:     22.23 Temp:     98.5 degrees F Pulse rate:   76 / minute BP sitting:   191 / 119  (left arm)  Vitals Entered By: Avelina Sharps RN (November 16, 2009 3:03 PM)  CC: diabetes follow up Is Patient Diabetic? Yes Did you bring your meter with you today? No Pain Assessment Patient in pain? yes     Location: stump of both legs Intensity: left 5 and right 10   Primary Care Provider:  Debby Petties MD  CC:  diabetes follow up.  History of Present Illness: 74y M with HTN, DM1, HLD here for fu.  DM1:  Pt does not have lancets and has not been checking his sugars, he states he has been using his insulin  and that his sugars have been running 160's-250's.  His A1c is >10.  Does not have his meter or a log so cannot verify or adjust insulin .  CBG's controlled in the hospital so if takes current dose of insulin  sugars should be controlled.    HTN:  Elevated today, BPs were well controlled in the hospital on current regimen.  HLD:  Lipid panel controlled in hospital.  Fall: bruised both BKA stumps, requesting percocet, states tylenol , ibu, ultracet dont work.  Habits & Providers  Alcohol-Tobacco-Diet     Other per week marijuana  Current Medications (verified): 1)  Benazepril Hcl 20 Mg Tabs (Benazepril Hcl) .... Take 1 Tablet By Mouth Once A Day 2)  Toprol  Xl 100 Mg  Tb24 (Metoprolol  Succinate) .... 2  Pills By Mouth Daily 3)  Caduet 10-40 Mg Tabs (Amlodipine -Atorvastatin ) .... One Tab By Mouth Qday 4)  Novolog  Flexpen 100 Unit/ml  Soln (Insulin  Aspart) .... Inject 10 Units Prior To 3 Meals Per Day If Pre-Meal Blood Glucose Is Higher Than 120 5)  Lantus  Solostar 100 Unit/ml  Soln (Insulin  Glargine) .... 35 Units Daily - 1 Box of 5 Pens. 6)  Bd U/f Short Pen Needle 31g X 8 Mm  Misc (Insulin  Pen Needle) .... Dispense One Box - Patient Doing 5  Shots Daily.  Dispense 1 Box 7)  Percocet 5-325 Mg Tabs (Oxycodone -Acetaminophen ) .SABRA.. 1 Tab By Mouth Q 6 Hours As Needed Pain.  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, coag neg staph (2/3) blood cx 1/08,  hospitalized for L foot abscess 8/07-, Long hospitalization in attempt to save r foot/leg-> amputated ULCER, CHRONIC SKIN, UNSPECIFIED (ICD-707.9) POST TRAUMATIC STRESS DISORDER (ICD-309.81) OSTEOMYELITIS, CHRONIC, UNSPECIFIED (ICD-730.10) IMPOTENCE, ORGANIC (ICD-607.84) HYPERTENSION, BENIGN SYSTEMIC (ICD-401.1) DIABETES MELLITUS, I (ICD-250.01) DEPRESSION, MAJOR, RECURRENT (ICD-296.30) HYPERTENSION (ICD-401.9) GERD (ICD-530.81) OSTEOMYELITIS, CHRONIC, 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  Previous use MJ 2 blunts or cigars a day.  Denies ETOH use. Pt lives in an apartment with his mother Per past hospital chart, occasional crack/cocaine history but pt  denies this.   Review of Systems       See HPI  Physical Exam  General:  Well-developed,well-nourished,in no acute distress; alert,appropriate and cooperative throughout examination Lungs:  Normal respiratory effort, chest expands symmetrically. Lungs are clear to auscultation, no crackles or wheezes. Heart:  Normal rate and regular rhythm. S1 and S2 normal without gallop, murmur, click, rub or other extra sounds. Extremities:  Painful bruises noted on both BKA sites.  No signs  infection, purulence, induration, fluctuance.   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  Orders: [facility]- Est  Level 4 (00785)  Problem # 2:  HYPERTENSION, BENIGN SYSTEMIC (ICD-401.1) Assessment: Deteriorated BP very elevated even on recheck by me but proven non-compliance as his BPs were well controlled in the hospital on same regimen.  His updated medication list for this problem includes:    Benazepril Hcl 20 Mg Tabs (Benazepril hcl) .SABRA... Take 1 tablet by mouth once a day    Toprol  Xl 100 Mg Tb24 (Metoprolol  succinate) .SABRA... 2  pills by mouth daily    Caduet 10-40 Mg Tabs (Amlodipine -atorvastatin ) ..... One tab by mouth qday  Orders: [facility]- Est  Level 4 (00785)  Problem # 3:  HYPERLIPIDEMIA (ICD-272.4) Assessment: Improved Controlled, no changes.  His updated medication list for this problem includes:    Caduet 10-40 Mg Tabs (Amlodipine -atorvastatin ) ..... One tab by mouth qday  Problem # 4:  DIABETES MELLITUS, I (ICD-250.01) Assessment: Improved Unable to adjust insulin  without CBG records, states his sugars have been running 160-250 but with an A1c >10 I suspect they have been higher, with sugars much better controlled in hospital I suspect non-compliance is again an issue here.  He will bring his meter in next week so I can check his sugars (says he checks 3-4 times a day).    His updated medication list for this problem includes:    Benazepril Hcl 20 Mg Tabs (Benazepril hcl) .SABRA... Take 1 tablet by mouth once a day    Novolog  Flexpen 100 Unit/ml Soln (Insulin  aspart) ..... Inject 10 units prior to 3 meals per day if pre-meal blood glucose is higher than 120    Lantus  Solostar 100 Unit/ml Soln (Insulin  glargine) .SABRASABRASABRASABRA 35 units daily - 1 box of 5 pens.  Orders: A1C-[facility] (16963) [facility]- Est  Level 4 (00785) Basic Met-[facility] (19951-77089)  Complete Medication List: 1)   Benazepril Hcl 20 Mg Tabs (Benazepril hcl) .... Take 1 tablet by mouth once a day 2)  Toprol  Xl 100 Mg Tb24 (Metoprolol  succinate) .... 2  pills by mouth daily 3)  Caduet 10-40 Mg Tabs (Amlodipine -atorvastatin ) .... One tab by mouth qday 4)  Novolog  Flexpen 100 Unit/ml Soln (Insulin  aspart) .... Inject 10 units prior to 3 meals per day if pre-meal blood glucose is higher than 120 5)  Lantus  Solostar 100 Unit/ml Soln (Insulin  glargine) .... 35 units daily - 1 box of 5 pens. 6)  Bd U/f Short Pen Needle 31g X 8 Mm Misc (Insulin  pen needle) .... Dispense one box - patient doing 5 shots daily.  dispense 1 box 7)  Percocet 5-325 Mg Tabs (Oxycodone -acetaminophen ) .SABRA.. 1 tab by mouth q 6 hours as needed pain.  Patient Instructions: 1)  Make appt to come see me next week, 2)  Bring meter, check sugars 3-4x/day, take insulin  DAILY as directed. 3)  Take BP meds as directed, Toprol  should be 200mg  daily.  4)  Refilled percocet. 5)  -Dr. ONEIDA. Prescriptions: PERCOCET 5-325 MG TABS (OXYCODONE -ACETAMINOPHEN ) 1 tab by mouth q 6 hours as needed pain.  #30 x 0   Entered and Authorized by:   Debby Petties MD   Signed by:   Debby Petties MD on 11/16/2009   Method used:   Print then Give to Patient   RxID:   [id number]    Vital Signs:  Patient profile:   35 year old male Height:      73 inches Weight:      167.9 pounds BMI:     22.23 Temp:     98.5 degrees F Pulse rate:   76 / minute BP sitting:   191 / 119  (left arm)  Vitals Entered By: Avelina Sharps RN (November 16, 2009 3:03 PM)   Laboratory Results   Blood Tests   Date/Time Received: November 16, 2009 2:57 PM  Date/Time Reported: November 16, 2009 3:16 PM   HGBA1C: 10.9%   (Normal Range: Non-Diabetic - 3-6%   Control Diabetic - 6-8%)  Comments: ...............test performed by......SABRABonnie A. Jordan, MLS (ASCP)cm       Prevention & Chronic Care Immunizations   Influenza vaccine: Fluvax Non-MCR  (09/26/2008)    Influenza vaccine due: 09/26/2009    Tetanus booster: 05/26/1998: Done.   Tetanus booster due: 05/26/2008    Pneumococcal vaccine: Done.  (11/26/1999)   Pneumococcal vaccine due: None  Other Screening   Smoking status: quit  (04/08/2008)  Diabetes Mellitus   HgbA1C: 10.9  (11/16/2009)   Hemoglobin A1C due: 01/15/2009    Eye exam: Not documented    Foot exam: Not documented   Foot exam action/deferral: Not indicated   High risk foot: Not documented   Foot care education: Not documented    Urine microalbumin/creatinine ratio: Not documented   Urine microalbumin action/deferral: Not indicated   Urine microalbumin/cr due: 03/13/2008    Diabetes flowsheet reviewed?: Yes   Progress toward A1C goal: Improved  Lipids   Total Cholesterol: 205  (03/14/2007)   Lipid panel action/deferral: Not indicated   LDL: 64  (09/13/2009)   LDL Direct: Not documented   HDL: 31  (09/13/2009)   Triglycerides: 119  (03/14/2007)   Lipid panel due: 03/13/2008    SGOT (AST): 13  (03/14/2007)   SGPT (ALT): 16  (03/14/2007)   Alkaline phosphatase: 116  (03/14/2007)   Total bilirubin: 0.3  (03/14/2007)    Lipid flowsheet reviewed?: Yes   Progress toward LDL goal: Improved  Hypertension   Last Blood Pressure: 191 / 119  (11/16/2009)   Serum creatinine: 1.0  (04/08/2008)   Serum potassium 4.2  (03/14/2007)    Hypertension flowsheet reviewed?: Yes   Progress toward BP goal: Deteriorated  Self-Management Support :   Personal Goals (by the next clinic visit) :     Personal A1C goal: 8  (11/16/2009)     Personal blood pressure goal: 130/80  (11/16/2009)     Personal LDL goal: 100  (11/16/2009)    Diabetes self-management support: Written self-care plan  (11/16/2009)   Diabetes care plan printed    Hypertension self-management support: Written self-care plan  (11/16/2009)   Hypertension self-care plan printed.    Lipid self-management support: Written self-care plan  (11/16/2009)   Lipid  self-care plan printed.   Appended Document: diabetes ck,tcb    Clinical Lists Changes  Medications: Changed medication from TOPROL  XL 100 MG  TB24 (METOPROLOL  SUCCINATE) 2  pills by mouth daily to TOPROL  XL 200  MG XR24H-TAB (METOPROLOL  SUCCINATE) One tab by mouth daily

## 2011-01-25 NOTE — Assessment & Plan Note (Signed)
Summary: LEG SWOLLEN/[facility]   Vital Signs:  Patient profile:   35 year old male Height:      73 inches Weight:      153.6 pounds BMI:     20.34 Temp:     99.2 degrees F oral Pulse rate:   97 / minute BP sitting:   128 / 79  (left arm) Cuff size:   regular  Vitals Entered By: Levert Feinstein LPN (September 19, 624THL 9:37 AM) CC: f/u cellulitis Is Patient Diabetic? No Pain Assessment Patient in pain? yes     Location: leftleg   Primary Care Provider:  Aundria Mems MD  CC:  f/u cellulitis.  History of Present Illness: 35 yo male with DM1, s/p I&D 09/01/10 with triple antibiotic coverage with doxy, cipro, flagyl here for fu.  Area is better but still painful and with a knot.  No fevers/chills, N/V/D/C.  minimal drainage.  Pain is sharp, located behind L upper calf on leg stump.  Nothing makes it better, palpation makes it worse.  No radiation.    Habits & Providers  Alcohol-Tobacco-Diet     Tobacco Status: current     Tobacco Counseling: to quit use of tobacco products     Cigarette Packs/Day: <0.25  Current Medications (verified): 1)  Toprol Xl 200 Mg Xr24h-Tab (Metoprolol Succinate) .... Two Tabs By Mouth Daily 2)  Novolog Flexpen 100 Unit/ml  Soln (Insulin Aspart) .... Inject 10 Units Prior To 3 Meals Per Day If Pre-Meal Blood Glucose Is Higher Than 120 3)  Lantus Solostar 100 Unit/ml  Soln (Insulin Glargine) .... 60 Units Inj Subcutaneously Qhs 4)  Bd U/f Short Pen Needle 31g X 8 Mm  Misc (Insulin Pen Needle) .... Dispense One Box - Patient Doing 5 Shots Daily.  Dispense 1 Box 5)  Percocet 5-325 Mg Tabs (Oxycodone-Acetaminophen) .[name].. 1 Tab By Mouth Q 6 Hours As Needed Pain. 6)  Lisinopril-Hydrochlorothiazide 20-25 Mg Tabs (Lisinopril-Hydrochlorothiazide) .... One Tab By Mouth Daily 7)  Pravastatin Sodium 20 Mg Tabs (Pravastatin Sodium) .... One Tab By Mouth Daily 8)  Aspirin 81 Mg Tbec (Aspirin) .... One Tab By Mouth Daily 9)  Prodigy Lancets 28g  Misc (Lancets) .... To  Be Used Qac and Hs For Insulin Dosing. 10)  Ofloxacin 0.3 % Soln (Ofloxacin) .[name].. 10 Drops in Affected Ear Daily X 7 Days 11)  Prodigy Onetouch Ultra Meter .... Use For Glucose Monitoring 12)  Zofran 4 Mg Tabs (Ondansetron Hcl) .... One Tab By Mouth Q4h As Needed For Nausea 13)  Ciprodex 0.3-0.1 % Susp (Ciprofloxacin-Dexamethasone) .... 4 Drops in Each Ear Two Times A Day X 7 Days  Allergies (verified): No Known Drug Allergies  Review of Systems       See HPI  Physical Exam  General:  Well-developed,well-nourished,in no acute distress; alert,appropriate and cooperative throughout examination Lungs:  Normal respiratory effort, chest expands symmetrically. Lungs are clear to auscultation, no crackles or wheezes. Heart:  Normal rate and regular rhythm. S1 and S2 normal without gallop, murmur, click, rub or other extra sounds. Extremities:  Area of previous I&D still open, sml amt of purulent drainage.  No surrounding induration.  Abscess 2cm across. Additional Exam:  Procedure: I&D Time out conducted. Consent obtained.  8cc lidocaine 2% with epi infiltrated in a field block around lesion. Area prepped and draped in a sterile fashion. #11 blade used to make 2cm incision, sml amt of pus came out right away.   Cultured the drainage. Curved hemostat used to explore quadrants.  More pus expressed with pressure. Packed with approx 8 inches of 1/4 inch iodoform. Area cleaned and dressings applied. Aftercare advised.   Impression & Recommendations:  Problem # 1:  CELLULITIS AND ABSCESS OF LEG EXCEPT FOOT (ICD-682.6) Assessment Improved Re-I&D of abscess.  No need for further antibiotic coverage. Sent pus for cultures. Pt is to pull out approx 3 inches of packing in 2d. RTC to see me in 3-4d to check wound and have rest of packing removed.  The following medications were removed from the medication list:    Cipro 500 Mg Tabs (Ciprofloxacin hcl) ..... One tab by mouth two times a day x  10 days    Metronidazole 500 Mg Tabs (Metronidazole) ..... One tab by mouth three times a day x 10 days    Doxycycline Hyclate 100 Mg Caps (Doxycycline hyclate) ..... One tab by mouth two times a day x 10 days  Orders: [facility]- Est Level  3 [id number]) I&D Abcess, simple- [facility] ([id number]) Culture, Wound -[place] [id number])  Complete Medication List: 1)  Toprol Xl 200 Mg Xr24h-tab (Metoprolol succinate) .... Two tabs by mouth daily 2)  Novolog Flexpen 100 Unit/ml Soln (Insulin aspart) .... Inject 10 units prior to 3 meals per day if pre-meal blood glucose is higher than 120 3)  Lantus Solostar 100 Unit/ml Soln (Insulin glargine) .... 60 units inj subcutaneously qhs 4)  Bd U/f Short Pen Needle 31g X 8 Mm Misc (Insulin pen needle) .... Dispense one box - patient doing 5 shots daily.  dispense 1 box 5)  Percocet 5-325 Mg Tabs (Oxycodone-acetaminophen) .[name].. 1 tab by mouth q 6 hours as needed pain. 6)  Lisinopril-hydrochlorothiazide 20-25 Mg Tabs (Lisinopril-hydrochlorothiazide) .... One tab by mouth daily 7)  Pravastatin Sodium 20 Mg Tabs (Pravastatin sodium) .... One tab by mouth daily 8)  Aspirin 81 Mg Tbec (Aspirin) .... One tab by mouth daily 9)  Prodigy Lancets 28g Misc (Lancets) .... To be used qac and hs for insulin dosing. 10)  Ofloxacin 0.3 % Soln (Ofloxacin) .[name].. 10 drops in affected ear daily x 7 days 11)  Prodigy Onetouch Ultra Meter  .... Use for glucose monitoring 12)  Zofran 4 Mg Tabs (Ondansetron hcl) .... One tab by mouth q4h as needed for nausea 13)  Ciprodex 0.3-0.1 % Susp (Ciprofloxacin-dexamethasone) .... 4 drops in each ear two times a day x 7 days  Patient Instructions: 1)  Great to see you, 2)  We drained and repacked your leg. 3)  Come back to see me on Thursday or Friday to recheck your leg and discuss stress/anxiety. 4)  Pull out approx 3 inches of the packing in 2 days and cut it off leaving an inch tail sticking out. 5)  -Dr. Darene Lamer. Prescriptions: CIPRODEX 0.3-0.1 % SUSP  (CIPROFLOXACIN-DEXAMETHASONE) 4 drops in [facility] ear two times a day x 7 days  #1 x 0   Entered and Authorized by:   Aundria Mems MD   Signed by:   Aundria Mems MD on 09/13/2010   Method used:   Faxed to ...       [facility] (retail)       [address]       [place], [place]  [address]       Ph: [phone]       Fax: [phone]   RxID:   [id number] ZOFRAN 4 MG TABS (ONDANSETRON HCL) One tab by mouth q4h as needed for nausea  #10 x 3  Entered and Authorized by:   Aundria Mems MD   Signed by:   Aundria Mems MD on 09/13/2010   Method used:   Faxed to ...       [facility] (retail)       [address]       [place], [place]  [address]       Ph: [phone]       Fax: [phone]   RxID:   [id number] PERCOCET 5-325 MG TABS (OXYCODONE-ACETAMINOPHEN) 1 tab by mouth q 6 hours as needed pain.  #20 x 0   Entered and Authorized by:   Aundria Mems MD   Signed by:   Aundria Mems MD on 09/13/2010   Method used:   Print then Give to Patient   RxID:   [id number]

## 2011-01-25 NOTE — Progress Notes (Signed)
  Phone Note Call from Patient   Caller: Patient Summary of Call: Possible insect bite to lf leg behind knee.  Pt now have red streaks and swelling to leg.   Need to be seen asap  Call [phone] Initial call taken by: Pamala Hurry [email or web address]  Follow-up for Phone Call        LM with his aunt asking that he call us back Follow-up by: Elige Radon RN,  September 01, 2010 10:19 AM  Additional Follow-up for Phone Call Additional follow up Details #1::        bit yesterday on back of knee. swollen, painful with streaks. offered 11am. stated "I will do my best" I asked if he would prefer pm appt. he said no he had to be seen now & if we could not see him he would go to ED. placed in 11am work in slot Additional Follow-up by: Elige Radon RN,  September 01, 2010 10:36 AM    Additional Follow-up for Phone Call Additional follow up Details #2::    Should go to [id number] if unable to be seen here.  Symptoms suggestive of lymphadenitis and in him would require antibiotics.  He is here to be seen. Follow-up by: Aundria Mems MD,  September 01, 2010 11:26 AM

## 2011-01-25 NOTE — Assessment & Plan Note (Signed)
Summary: f/u eo   Vital Signs:  Patient profile:   35 year old male Weight:      172 pounds Temp:     98.8 degrees F oral Pulse rate:   86 / minute Pulse rhythm:   regular BP sitting:   179 / 108  (right arm) Cuff size:   regular  Vitals Entered By: Audelia Hives CMA (October 08, 2010 9:31 AM) CC: follow-up visit Is Patient Diabetic? Yes   Primary Care Provider:  Aundria Mems MD  CC:  follow-up visit.  History of Present Illness: 35 yo male here to fu mood and leg.  L leg abscess:  Healing well, non painful, no drainage, no fevers/chills, finished cipro, doxy, flagyl.    Mood:  Much better on three times a day wellbutrin.  More active, happier, friends notice he behaves better.  Scored 1 on PHQ-9.  Does not wish to change to XL version of the medication.  DM1:  CBGs: ok but some lows.   7d avg: 132 14d: 140 30d: 127 Lowest: 42, knows to eat something when feels low, symptoms include shaking, sweating.    Exposure:  Girlfriend has trichomoniasis.  Pt has no dysuria, fevers/chills, discharge but wants to be tested.   Habits & Providers  Alcohol-Tobacco-Diet     Tobacco Status: never     Tobacco Counseling: to quit use of tobacco products     Cigarette Packs/Day: <0.25  Current Medications (verified): 1)  Toprol Xl 200 Mg Xr24h-Tab (Metoprolol Succinate) .... Two Tabs By Mouth Daily 2)  Novolog Flexpen 100 Unit/ml  Soln (Insulin Aspart) .... Inject 10 Units Prior To 3 Meals Per Day If Pre-Meal Blood Glucose Is Higher Than 120 3)  Lantus Solostar 100 Unit/ml  Soln (Insulin Glargine) .... 50 Units Inj Subcutaneously Qhs 4)  Bd U/f Short Pen Needle 31g X 8 Mm  Misc (Insulin Pen Needle) .... Dispense One Box - Patient Doing 5 Shots Daily.  Dispense 1 Box 5)  Percocet 5-325 Mg Tabs (Oxycodone-Acetaminophen) .[name].. 1 Tab By Mouth Q 6 Hours As Needed Pain. 6)  Lisinopril-Hydrochlorothiazide 20-25 Mg Tabs (Lisinopril-Hydrochlorothiazide) .... One Tab By Mouth Daily 7)   Pravastatin Sodium 20 Mg Tabs (Pravastatin Sodium) .... One Tab By Mouth Daily 8)  Aspirin 81 Mg Tbec (Aspirin) .... One Tab By Mouth Daily 9)  Prodigy Lancets 28g  Misc (Lancets) .... To Be Used Qac and Hs For Insulin Dosing. 10)  Prodigy Onetouch Ultra Meter .... Use For Glucose Monitoring 11)  Zofran 4 Mg Tabs (Ondansetron Hcl) .... One Tab By Mouth Q4h As Needed For Nausea 12)  Wellbutrin 100 Mg Tabs (Bupropion Hcl) .... One Tab By Mouth Two Times A Day X 3d Then Increase To Three Times A Day  Allergies (verified): No Known Drug Allergies  Review of Systems       See HPI  Physical Exam  General:  Well-developed,well-nourished,in no acute distress; alert,appropriate and cooperative throughout examination Mouth:  pharynx pink and moist.   Lungs:  Normal respiratory effort, chest expands symmetrically. Lungs are clear to auscultation, no crackles or wheezes. Heart:  Normal rate and regular rhythm. S1 and S2 normal without gallop, murmur, click, rub or other extra sounds. Extremities:  Well healed, closed, firm but no fluctuance, no drainage, no induration. Psych:  Cognition and judgment appear intact. Alert and cooperative with normal attention span and concentration. No apparent delusions, illusions, hallucinations   Impression & Recommendations:  Problem # 1:  OTHER SYMPTOMS INVOLVING ABDOMEN AND  PELVIS (ICD-789.9) Assessment New Checking UA and Urine GC/Chlam for trich, other STDs, pt does not wish to be tested for HIV/syphilis.  Orders: Urinalysis-[facility] (00000) GC/Chlamydia-[facility] (87591/87491)  Problem # 2:  CELLULITIS AND ABSCESS OF LEG EXCEPT FOOT (ICD-682.6) Assessment: Improved Resolved.  The following medications were removed from the medication list:    Doxycycline Hyclate 100 Mg Caps (Doxycycline hyclate) ..... One cap by mouth two times a day x 10 day  Orders: [place]- Est  Level 4 [id number])  Problem # 3:  DIABETES MELLITUS, TYPE I, UNCONTROLLED, WITH COMPLICATIONS  (Q000111Q) Unclear etiology for lows.  He claims he eats multiple small meals a day, doesn't skip meals.  Meals are 4 course.  Taking Lantus 60. Advised decrease to Lantus 50, RTC 1 week to recheck. His interest in controlling his CBGs has improved since starting the Wellbutrin. Will also have him see Dr. Jenne Campus to help in diet planning.  His updated medication list for this problem includes:    Novolog Flexpen 100 Unit/ml Soln (Insulin aspart) ..... Inject 10 units prior to 3 meals per day if pre-meal blood glucose is higher than 120    Lantus Solostar 100 Unit/ml Soln (Insulin glargine) .Marland KitchenMarland KitchenMarland KitchenMarland Kitchen 50 units inj subcutaneously qhs    Lisinopril-hydrochlorothiazide 20-25 Mg Tabs (Lisinopril-hydrochlorothiazide) ..... One tab by mouth daily    Aspirin 81 Mg Tbec (Aspirin) ..... One tab by mouth daily  Orders: [place]- Est  Level 4 [id number]) Nutrition Referral (Nutrition)  Problem # 4:  DEPRESSION, MAJOR, RECURRENT (ICD-296.30) Assessment: Improved Improved. Failed 2 SSRIs, not stable on Wellbutrin. PHQ-9 score dropped to 1. Will assess at next visit to ensure not early pacebo effect.  Orders: [place]- Est  Level 4 (99214)  Complete Medication List: 1)  Toprol Xl 200 Mg Xr24h-tab (Metoprolol succinate) .... Two tabs by mouth daily 2)  Novolog Flexpen 100 Unit/ml Soln (Insulin aspart) .... Inject 10 units prior to 3 meals per day if pre-meal blood glucose is higher than 120 3)  Lantus Solostar 100 Unit/ml Soln (Insulin glargine) .... 50 units inj subcutaneously qhs 4)  Bd U/f Short Pen Needle 31g X 8 Mm Misc (Insulin pen needle) .... Dispense one box - patient doing 5 shots daily.  dispense 1 box 5)  Percocet 5-325 Mg Tabs (Oxycodone-acetaminophen) .[name].. 1 tab by mouth q 6 hours as needed pain. 6)  Lisinopril-hydrochlorothiazide 20-25 Mg Tabs (Lisinopril-hydrochlorothiazide) .... One tab by mouth daily 7)  Pravastatin Sodium 20 Mg Tabs (Pravastatin sodium) .... One tab by mouth daily 8)  Aspirin 81 Mg  Tbec (Aspirin) .... One tab by mouth daily 9)  Prodigy Lancets 28g Misc (Lancets) .... To be used qac and hs for insulin dosing. 10)  Prodigy Onetouch Ultra Meter  .... Use for glucose monitoring 11)  Zofran 4 Mg Tabs (Ondansetron hcl) .... One tab by mouth q4h as needed for nausea 12)  Wellbutrin 100 Mg Tabs (Bupropion hcl) .... One tab by mouth two times a day x 3d then increase to three times a day  Patient Instructions: 1)  Great to see you, 2)  Decrease your Lantus to 50 units in the morning. 3)  Check your sugars 4x a day. 4)  Make appt to see our nutritionist. 5)  Refilled pain meds. 6)  Come back to see me in a week to discuss our sugars. 7)  -Dr. Darene Lamer. Prescriptions: PERCOCET 5-325 MG TABS (OXYCODONE-ACETAMINOPHEN) 1 tab by mouth q 6 hours as needed pain.  #30 x 0   Entered and Authorized  by:   Aundria Mems MD   Signed by:   Aundria Mems MD on 10/08/2010   Method used:   Print then Give to Patient   RxID:   [id number]   Laboratory Results   Urine Tests  Date/Time Received: October 14, 201110:30 AM  Date/Time Reported: October 08, 2010 1:43 PM   Routine Urinalysis   Color: yellow Appearance: Clear Glucose: 500   (Normal Range: Negative) Bilirubin: negative   (Normal Range: Negative) Ketone: negative   (Normal Range: Negative) Spec. Gravity: 1.025   (Normal Range: 1.003-1.035) Blood: negative   (Normal Range: Negative) pH: 6.5   (Normal Range: 5.0-8.0) Protein: >=300   (Normal Range: Negative) Urobilinogen: 0.2   (Normal Range: 0-1) Nitrite: negative   (Normal Range: Negative) Leukocyte Esterace: negative   (Normal Range: Negative)  Urine Microscopic WBC/HPF: 0-3 RBC/HPF: 0-3 Bacteria/HPF: trace Epithelial/HPF: rare    Comments: ...........test performed by...........Marland KitchenHedy Camara, CMA

## 2011-01-25 NOTE — Progress Notes (Signed)
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 Initial call taken by: Audie Clear,  May 06, 2010 1:37 PM  Follow-up for Phone Call        fell & broke prosthesis. has appt tomorrow at 8:30 to get leg fixed. states stump is painful & he is out of narcotic pain meds. told him he will need to be seen. he has no way to get here today. appt made for tomorrrow with Dr. Drue Flirt. went over the probation letter with him. he said he never got it. told him we will give him a copy at his visit. Follow-up by: Elige Radon RN,  May 06, 2010 2:06 PM  Additional Follow-up for Phone Call Additional follow up Details #1::        Noted, no scripts without appt.  To Dr. Drue Flirt, I would be ok with a refill on his percocet. Additional Follow-up by: Aundria Mems MD,  May 06, 2010 3:32 PM

## 2011-01-25 NOTE — Consult Note (Signed)
 Summary: [facility]  [facility]   Imported By: Madelin Daring 12/07/2009 11:20:48  _____________________________________________________________________  External Attachment:    Type:   Image     Comment:   External Document

## 2011-01-25 NOTE — Progress Notes (Signed)
Summary: Rx Req  Phone Note Refill Request Call back at [phone] Message from:  Patient  Refills Requested: Medication #1:  PERCOCET 5-325 MG TABS 1 tab by mouth q 6 hours as needed pain. PLEASE CALL WHEN READY TO PICK UP.  Initial call taken by: Raymond Gurney,  February 05, 2010 10:42 AM  Follow-up for Phone Call        will forward to MD. Follow-up by: Marcell Barlow RN,  February 05, 2010 10:47 AM    Prescriptions: PERCOCET 5-325 MG TABS (OXYCODONE-ACETAMINOPHEN) 1 tab by mouth q 6 hours as needed pain.  #30 x 0   Entered and Authorized by:   Aundria Mems MD   Signed by:   Aundria Mems MD on 02/05/2010   Method used:   Handwritten   [id number]

## 2011-01-25 NOTE — Assessment & Plan Note (Signed)
 Summary: f/u visit/bmc   Vital Signs:  Patient profile:   35 year old male Height:      73 inches Weight:      164.3 pounds Temp:     98.1 degrees F oral Pulse rate:   91 / minute BP sitting:   203 / 132  (right arm) Cuff size:   regular  Vitals Entered By: Olam Keeling (November 30, 2009 3:17 PM) CC: F/U appt BP  and DM Is Patient Diabetic? Yes Did you bring your meter with you today? No Pain Assessment Patient in pain? no        Serial Vital Signs/Assessments:  Time      Position  BP       Pulse  Resp  Temp     By 202                 202/122                        Olam Keeling  Comments: 202 Reck manually By: Olam Keeling    CC:  F/U appt BP  and DM.  Habits & Providers  Alcohol-Tobacco-Diet     Tobacco Status: current     Tobacco Counseling: to quit use of tobacco products     Cigarette Packs/Day: <0.25  Allergies: No Known Drug Allergies  Social History: Smoking Status:  current Packs/Day:  <0.25   Complete Medication List: 1)  Benazepril Hcl 20 Mg Tabs (Benazepril hcl) .... Take 1 tablet by mouth once a day 2)  Toprol  Xl 200 Mg Xr24h-tab (Metoprolol  succinate) .... One tab by mouth daily 3)  Caduet 10-40 Mg Tabs (Amlodipine -atorvastatin ) .... One tab by mouth qday 4)  Novolog  Flexpen 100 Unit/ml Soln (Insulin  aspart) .... Inject 10 units prior to 3 meals per day if pre-meal blood glucose is higher than 120 5)  Lantus  Solostar 100 Unit/ml Soln (Insulin  glargine) .... 35 units daily - 1 box of 5 pens. 6)  Bd U/f Short Pen Needle 31g X 8 Mm Misc (Insulin  pen needle) .... Dispense one box - patient doing 5 shots daily.  dispense 1 box 7)  Percocet 5-325 Mg Tabs (Oxycodone -acetaminophen ) .SABRA.. 1 tab by mouth q 6 hours as needed pain.  Other Orders: EKG- [facility] (EKG)   Appended Document: Orders Update    Clinical Lists Changes  Problems: Added new problem of CHEST PAIN (ICD-786.50) Orders: Added new Test order of [facility]- Est Level  5 (00784) -  Signed Observations: Added new observation of INSTRUCTIONS: Head to admitting in the hospital.  I have called in a bed for you.  You will be on the cardiac telemetry floor.  Dr. Anton Blas will take over your care from there.  -Dr. ONEIDA. (11/30/2009 15:37)        Patient Instructions: 1)  Head to admitting in the hospital.  I have called in a bed for you.  You will be on the cardiac telemetry floor.  Dr. Anton Blas will take over your care from there. 2)  -Dr. ONEIDA.

## 2011-01-25 NOTE — Progress Notes (Signed)
Summary: triage  Phone Note Call from Patient Call back at [phone]   Caller: Patient Summary of Call: diarrhea/throwing up today no fever Initial call taken by: Audie Clear,  December 29, 2009 3:35 PM  Follow-up for Phone Call        336 today. has not been able to eat . he is keeping ginger ale down. suggested he go to UC tonight. he refused appt for am here with Dr. Annamary Carolin. told him if unable to keep ginger ale down or diarrhea got worse go to ED. He agreed with this plan Follow-up by: Elige Radon RN,  December 29, 2009 3:41 PM  Additional Follow-up for Phone Call Additional follow up Details #1::        Agreed, thanks. Additional Follow-up by: Aundria Mems MD,  December 30, 2009 3:15 AM

## 2011-01-25 NOTE — Assessment & Plan Note (Signed)
Summary: f/u last visit/eo   Vital Signs:  Patient profile:   35 year old male Height:      73 inches Weight:      165.2 pounds BMI:     21.87 Temp:     99.5 degrees F oral Pulse rate:   78 / minute BP sitting:   180 / 110  (left arm) Cuff size:   regular  Vitals Entered By: Levert Feinstein LPN (October 27, 624THL 3:25 PM)   Primary Care Provider:  Aundria Mems MD   History of Present Illness: Here for fu DM2, HTN, HLD, GERD.  GERD:  Not taking any PPI, heartburn, clears throat a lot, hoarse, cough, all worse when laying flat at night.  HTN:  Not controlled, claims taking all meds as rxed.  HLD:  Taking Pravastatin. Due for lipids.  DM2:  High: 278, Low: 55, did not bring maching today.  Taking Lantus 50, Novolog 5 with meals, not on any SSI.  Current Medications (verified): 1)  Toprol Xl 200 Mg Xr24h-Tab (Metoprolol Succinate) .... Two Tabs By Mouth Daily 2)  Novolog Flexpen 100 Unit/ml  Soln (Insulin Aspart) .... Inject 10 Units Prior To 3 Meals Per Day If Pre-Meal Blood Glucose Is Higher Than 120 3)  Lantus Solostar 100 Unit/ml  Soln (Insulin Glargine) .... 50 Units Inj Subcutaneously Qhs 4)  Bd U/f Short Pen Needle 31g X 8 Mm  Misc (Insulin Pen Needle) .... Dispense One Box - Patient Doing 5 Shots Daily.  Dispense 1 Box 5)  Percocet 5-325 Mg Tabs (Oxycodone-Acetaminophen) .[name].. 1 Tab By Mouth Q 6 Hours As Needed Pain. 6)  Lisinopril-Hydrochlorothiazide 20-25 Mg Tabs (Lisinopril-Hydrochlorothiazide) .... One Tab By Mouth Daily 7)  Pravastatin Sodium 20 Mg Tabs (Pravastatin Sodium) .... One Tab By Mouth Daily 8)  Aspirin 81 Mg Tbec (Aspirin) .... One Tab By Mouth Daily 9)  Prodigy Lancets 28g  Misc (Lancets) .... To Be Used Qac and Hs For Insulin Dosing. 10)  Prodigy Onetouch Ultra Meter .... Use For Glucose Monitoring 11)  Zofran 4 Mg Tabs (Ondansetron Hcl) .... One Tab By Mouth Q4h As Needed For Nausea 12)  Wellbutrin 100 Mg Tabs (Bupropion Hcl) .... One Tab By Mouth  Two Times A Day X 3d Then Increase To Three Times A Day 13)  Norvasc 5 Mg Tabs (Amlodipine Besylate) .... One Tab By Mouth Daily 14)  Nexium 40 Mg Cpdr (Esomeprazole Magnesium) .... One Tab By Mouth Qhs  Allergies (verified): No Known Drug Allergies  Review of Systems       See HPI  Physical Exam  General:  Well-developed,well-nourished,in no acute distress; alert,appropriate and cooperative throughout examination Lungs:  Normal respiratory effort, chest expands symmetrically. Lungs are clear to auscultation, no crackles or wheezes. Heart:  Normal rate and regular rhythm. S1 and S2 normal without gallop, murmur, click, rub or other extra sounds. Abdomen:  Bowel sounds positive,abdomen soft and non-tender without masses, organomegaly or hernias noted.   Impression & Recommendations:  Problem # 1:  HYPERTENSION, BENIGN SYSTEMIC (ICD-401.1) Assessment Deteriorated Uncontrolled, better on recheck. Adding amlodipine to meds.  His updated medication list for this problem includes:    Toprol Xl 200 Mg Xr24h-tab (Metoprolol succinate) .[name]..[name] Two tabs by mouth daily    Lisinopril-hydrochlorothiazide 20-25 Mg Tabs (Lisinopril-hydrochlorothiazide) ..... One tab by mouth daily    Norvasc 5 Mg Tabs (Amlodipine besylate) ..... One tab by mouth daily  Orders: [facility]- Est  Level 4 [id number])  Problem # 2:  HYPERLIPIDEMIA (ICD-272.4)  Assessment: Unchanged Checking dLDL today. Adjust Pravachol accordingly.  His updated medication list for this problem includes:    Pravastatin Sodium 20 Mg Tabs (Pravastatin sodium) ..... One tab by mouth daily  Orders: [facility]- Est  Level 4 [id number]) Direct LDL-[facility] [id number])  Problem # 3:  DIABETES MELLITUS, TYPE I, UNCONTROLLED, WITH COMPLICATIONS (Q000111Q) Assessment: Unchanged SSI will be started again today: Lantus 50 nightly. Novolog 10 units prior to meals as long as pre-meal glucose is higher than 120.   Cont use of sliding scale if glucose is    200-300: additional 5 units >300: additional 7 units.  A1c today. RTC 1 month to recheck, his A1c has been in the 7's in the past.  His updated medication list for this problem includes:    Novolog Flexpen 100 Unit/ml Soln (Insulin aspart) ..... Inject 10 units prior to 3 meals per day if pre-meal blood glucose is higher than 120    Lantus Solostar 100 Unit/ml Soln (Insulin glargine) .Marland KitchenMarland KitchenMarland KitchenMarland Kitchen 50 units inj subcutaneously qhs    Lisinopril-hydrochlorothiazide 20-25 Mg Tabs (Lisinopril-hydrochlorothiazide) ..... One tab by mouth daily    Aspirin 81 Mg Tbec (Aspirin) ..... One tab by mouth daily  Orders: A1C-[facility] [id number]) [place]- Est  Level 4 [id number])  Problem # 4:  GERD (ICD-530.81) Assessment: Deteriorated Added nexium.  His updated medication list for this problem includes:    Nexium 40 Mg Cpdr (Esomeprazole magnesium) ..... 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  Refilled percocet.  Complete Medication List: 1)  Toprol Xl 200 Mg Xr24h-tab (Metoprolol succinate) .... Two tabs by mouth daily 2)  Novolog Flexpen 100 Unit/ml Soln (Insulin aspart) .... Inject 10 units prior to 3 meals per day if pre-meal blood glucose is higher than 120 3)  Lantus Solostar 100 Unit/ml Soln (Insulin glargine) .... 50 units inj subcutaneously qhs 4)  Bd U/f Short Pen Needle 31g X 8 Mm Misc (Insulin pen needle) .... Dispense one box - patient doing 5 shots daily.  dispense 1 box 5)  Percocet 5-325 Mg Tabs (Oxycodone-acetaminophen) .[name].. 1 tab by mouth q 6 hours as needed pain. 6)  Lisinopril-hydrochlorothiazide 20-25 Mg Tabs (Lisinopril-hydrochlorothiazide) .... One tab by mouth daily 7)  Pravastatin Sodium 20 Mg Tabs (Pravastatin sodium) .... One tab by mouth daily 8)  Aspirin 81 Mg Tbec (Aspirin) .... One tab by mouth daily 9)  Prodigy Lancets 28g Misc (Lancets) .... To be used qac and hs for insulin  dosing. 10)  Prodigy Onetouch Ultra Meter  .... Use for glucose monitoring 11)  Zofran 4 Mg Tabs (Ondansetron hcl) .... One tab by mouth q4h as needed for nausea 12)  Wellbutrin 100 Mg Tabs (Bupropion hcl) .... One tab by mouth two times a day x 3d then increase to three times a day 13)  Norvasc 5 Mg Tabs (Amlodipine besylate) .... One tab by mouth daily 14)  Nexium 40 Mg Cpdr (Esomeprazole magnesium) .... One tab by mouth qhs  Patient Instructions: 1)  Sliding scale as follows: 2)  Lantus 50 nightly. 3)  Novolog 10 units prior to meals as long as pre-meal glucose is higher than 120.  Cont use of sliding scale if glucose is  4)  200-300: additional 5 units 5)  >300: additional 7 units. 6)  A1c today. 7)  Refilled percocet. 8)  Nexium for reflux. 9)  Added amlodipine for blood pressure. 10)  Come  back to see me in 1 month. 11)  -Dr. Darene Lamer. Prescriptions: NEXIUM 40 MG CPDR (ESOMEPRAZOLE MAGNESIUM) One tab by mouth qHS  #90 x 3   Entered and Authorized by:   Aundria Mems MD   Signed by:   Aundria Mems MD on 10/21/2010   Method used:   Print then Give to Patient   RxID:   [id number] PERCOCET 5-325 MG TABS (OXYCODONE-ACETAMINOPHEN) 1 tab by mouth q 6 hours as needed pain.  #30 x 0   Entered and Authorized by:   Aundria Mems MD   Signed by:   Aundria Mems MD on 10/21/2010   Method used:   Print then Give to Patient   RxID:   [id number] Moonshine 5 MG TABS (AMLODIPINE BESYLATE) One tab by mouth daily  #90 x 0   Entered and Authorized by:   Aundria Mems MD   Signed by:   Aundria Mems MD on 10/21/2010   Method used:   Print then Give to Patient   RxID:   [id number]    Orders Added: 1)  A1C-[facility] [[id number]] 2)  [facility]- Est  Level 4 [id number] 3)  Direct LDL-[facility] [id number]         Appended Document: A1c  10.8 %    Lab Visit  Laboratory Results   Blood Tests   Date/Time Received: October 21, 2010 3:54 PM  Date/Time  Reported: October 21, 2010 4:12 PM   HGBA1C: 10.8%   (Normal Range: Non-Diabetic - 3-6%   Control Diabetic - 6-8%)  Comments: ...............test performed by......Marland KitchenBonnie A. [name], MLS (ASCP)cm    Orders Today:

## 2011-01-25 NOTE — Miscellaneous (Signed)
  Clinical Lists Changes  Problems: Removed problem of ULCER, CHRONIC SKIN, UNSPECIFIED (ICD-707.9)

## 2011-01-25 NOTE — Assessment & Plan Note (Signed)
 Summary: Hospital Admission   Vital Signs:  Patient profile:   35 year old male Temp:     98.1 degrees F Pulse rate:   91 / minute Pulse rhythm:   regular Resp:     20 per minute BP sitting:   203 / 132  Primary Care Provider:  Debby Petties MD   History of Present Illness: 32y AAM with HTN, DM1, HLD, here with elevated BP and CP.  CP:  Has been going on for a couple weeks now, on and off, precordial, radiates to neck and left arm, worse with exertion, better with resting, lasts approx 1-2 mins, associated with SOB, nausea, diaphoresis.  Never had cath or stress test.  Hx poorly controlled DM1, HLD, HTN.  Having CP in office today.    HTN:  Usually well controlled in hospital when on the same medications.  Assures me he has been using his meds.  Now with SBP over 200, HA, some blurry vision, CP.  Creat was 1.9 on 11/16/09, missed appt for recheck in office.  Baseline Creat is normal.  DM1:  Last A1c was 10.9 on 11/16/09.  States he is taking his insulin  but sugars well controlled in the hospital on home regimen.  HLD:  On caduet.  BKA: Bilateral, 2/2 DM1, osteomyelitis.  Current Medications (verified): 1)  Benazepril Hcl 20 Mg Tabs (Benazepril Hcl) .... Take 1 Tablet By Mouth Once A Day 2)  Toprol  Xl 200 Mg Xr24h-Tab (Metoprolol  Succinate) .... One Tab By Mouth Daily 3)  Caduet 10-40 Mg Tabs (Amlodipine -Atorvastatin ) .... One Tab By Mouth Qday 4)  Novolog  Flexpen 100 Unit/ml  Soln (Insulin  Aspart) .... Inject 10 Units Prior To 3 Meals Per Day If Pre-Meal Blood Glucose Is Higher Than 120 5)  Lantus  Solostar 100 Unit/ml  Soln (Insulin  Glargine) .... 35 Units Daily - 1 Box of 5 Pens. 6)  Bd U/f Short Pen Needle 31g X 8 Mm  Misc (Insulin  Pen Needle) .... Dispense One Box - Patient Doing 5 Shots Daily.  Dispense 1 Box 7)  Percocet 5-325 Mg Tabs (Oxycodone -Acetaminophen ) .SABRA.. 1 Tab By Mouth Q 6 Hours As Needed Pain.  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, coag neg staph (2/3) blood cx 1/08,  hospitalized for L foot abscess 8/07-, Long hospitalization in attempt to save r foot/leg-> amputated ULCER, CHRONIC SKIN, UNSPECIFIED (ICD-707.9) POST TRAUMATIC STRESS DISORDER (ICD-309.81) OSTEOMYELITIS, CHRONIC, UNSPECIFIED (ICD-730.10) IMPOTENCE, ORGANIC (ICD-607.84) HYPERTENSION, BENIGN SYSTEMIC (ICD-401.1) DIABETES MELLITUS, I (ICD-250.01) DEPRESSION, MAJOR, RECURRENT (ICD-296.30) HYPERTENSION (ICD-401.9) GERD (ICD-530.81) OSTEOMYELITIS, CHRONIC, 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  Previous use MJ 2 blunts or cigars a day.  Denies ETOH use. Pt lives in an apartment with his mother Per past hospital chart, occasional crack/cocaine history but pt denies this.   Review of Systems       See HPI  Physical Exam  General:  Well-developed,well-nourished,in no acute distress; alert,appropriate and cooperative throughout examination Head:  Normocephalic and atraumatic without obvious abnormalities. No apparent alopecia or balding.  Eyes:  No corneal or conjunctival inflammation noted. EOMI. Perrla. Funduscopic exam benign, without hemorrhages, exudates or papilledema. Vision grossly normal. Ears:  External ear exam shows no significant lesions or deformities.  Nose:  External nasal examination shows no deformity or inflammation. Nasal mucosa are pink and moist without lesions or exudates. Mouth:  Oral mucosa and  oropharynx without lesions or exudates.   Lungs:  Normal respiratory effort, chest expands symmetrically. Lungs are clear to auscultation, no crackles or wheezes. Heart:  Normal rate and regular rhythm. S1 and S2 normal without gallop, murmur, click, rub or other extra sounds. Abdomen:  Bowel sounds positive,abdomen soft and non-tender without masses, organomegaly or hernias noted. Msk:  B/L BKA.  Some bruising on stumps s/p fall. Neurologic:  No cranial nerve deficits noted.  Gait normal with B/L prostheses. Sensory, motor and coordinative functions appear intact. Skin:  Intact without suspicious lesions or rashes Additional Exam:  12 lead: NSR, no S-T changes, Deep Q-waves in anterior precordial leads.  No EKG for comparison.   Impression & Recommendations:  Problem # 1:  CHEST PAIN (ICD-786.50) Assessment New Typical, will admit for rule out, risk startify with FLP, A1c.  Will need cardiology consultation.  Heparin  drip per pharm with typical symptoms, NTG as needed, already on B- blocker, will start ASA.  AM EKG, CE x3 q8h apart.  This patient is high risk for CAD.  Problem # 2:  RENAL INSUFFICIENCY (ICD-588.9) Assessment: Deteriorated Checking CMET in hospital, last Creat elevated, likely 2/2 HTN.  Will check Urine Na Urine Creat, to calculate FeNa.  Problem # 3:  HYPERTENSION, BENIGN SYSTEMIC (ICD-401.1) Will place on home meds.  His updated medication list for this problem includes:    Benazepril Hcl 20 Mg Tabs (Benazepril hcl) .SABRA... Take 1 tablet by mouth once a day    Toprol  Xl 200 Mg Xr24h-tab (Metoprolol  succinate) ..... One tab by mouth daily    Caduet 10-40 Mg Tabs (Amlodipine -atorvastatin ) ..... One tab by mouth qday  Problem # 4:  HYPERLIPIDEMIA (ICD-272.4) Home meds.  His updated medication list for this problem includes:    Caduet 10-40 Mg Tabs (Amlodipine -atorvastatin ) ..... One tab by mouth qday  Problem # 5:  DIABETES MELLITUS, I (ICD-250.01) Will place on SSI  sensitive, Lantus  35 once daily.  His updated medication list for this problem includes:    Benazepril Hcl 20 Mg Tabs (Benazepril hcl) .SABRA... Take 1 tablet by mouth once a day    Novolog  Flexpen 100 Unit/ml Soln (Insulin  aspart) ..... Inject 10 units prior to 3 meals per day if pre-meal blood glucose is higher than 120    Lantus  Solostar 100 Unit/ml Soln (Insulin  glargine) .SABRASABRASABRASABRA 35 units daily - 1 box of 5 pens.  Problem # 6:  FEN/GI D5 1/2NS @ 125 cc/h NPO except meds.  Problem # 7:  PPx Heparin  drip Protonix  40 qd  Problem # 8:  Dispo Pending evaluation and rule out for MI.    Complete Medication List: 1)  Benazepril Hcl 20 Mg Tabs (Benazepril hcl) .... Take 1 tablet by mouth once a day 2)  Toprol  Xl 200 Mg Xr24h-tab (Metoprolol  succinate) .... One tab by mouth daily 3)  Caduet 10-40 Mg Tabs (Amlodipine -atorvastatin ) .... One tab by mouth qday 4)  Novolog  Flexpen 100 Unit/ml Soln (Insulin  aspart) .... Inject 10 units prior to 3 meals per day if pre-meal blood glucose is higher than 120 5)  Lantus  Solostar 100 Unit/ml Soln (Insulin  glargine) .... 35 units daily -  1 box of 5 pens. 6)  Bd U/f Short Pen Needle 31g X 8 Mm Misc (Insulin  pen needle) .... Dispense one box - patient doing 5 shots daily.  dispense 1 box 7)  Percocet 5-325 Mg Tabs (Oxycodone -acetaminophen ) .SABRA.. 1 tab by mouth q 6 hours as needed pain.

## 2011-01-25 NOTE — Assessment & Plan Note (Signed)
Summary: f/u visit/bmc   Vital Signs:  Patient profile:   35 year old male Height:      73 inches Weight:      163 pounds BMI:     21.58 Temp:     98.2 degrees F oral Pulse rate:   72 / minute BP sitting:   129 / 88  (right arm) Cuff size:   regular  Vitals Entered By: Enid Skeens, CMA (November 12, 2010 11:55 AM) CC: f/u, wet cough and rt ear pain x4 days Is Patient Diabetic? Yes Did you bring your meter with you today? No Comments robitussin-----not helping   Primary Care Provider:  Aundria Mems MD  CC:  f/u and wet cough and rt ear pain x4 days.  History of Present Illness: 35 yo male with R ear pain and cough for 4 d.  Phlegm production, ST, chills, no N/V/D/C, +sick contacts, no facial pain, no mastoid tenderness, no rashes.  No double sickening, no body aches, no abd pain.  HTN:  BP well controlled today.  DM1:  Doing well with SSI, AM CBGs 170's, no lows, no CBGs >250.  Habits & Providers  Alcohol-Tobacco-Diet     Tobacco Status: quit  Current Medications (verified): 1)  Toprol Xl 200 Mg Xr24h-Tab (Metoprolol Succinate) .... Two Tabs By Mouth Daily 2)  Novolog Flexpen 100 Unit/ml  Soln (Insulin Aspart) .... Inject 10 Units Prior To 3 Meals Per Day If Pre-Meal Blood Glucose Is Higher Than 120 3)  Lantus Solostar 100 Unit/ml  Soln (Insulin Glargine) .... 50 Units Inj Subcutaneously Qhs 4)  Bd U/f Short Pen Needle 31g X 8 Mm  Misc (Insulin Pen Needle) .... Dispense One Box - Patient Doing 5 Shots Daily.  Dispense 1 Box 5)  Percocet 5-325 Mg Tabs (Oxycodone-Acetaminophen) .[name].. 1 Tab By Mouth Q 6 Hours As Needed Pain. 6)  Lisinopril-Hydrochlorothiazide 20-25 Mg Tabs (Lisinopril-Hydrochlorothiazide) .... One Tab By Mouth Daily 7)  Pravastatin Sodium 20 Mg Tabs (Pravastatin Sodium) .... One Tab By Mouth Daily 8)  Aspirin 81 Mg Tbec (Aspirin) .... One Tab By Mouth Daily 9)  Prodigy Lancets 28g  Misc (Lancets) .... To Be Used Qac and Hs For Insulin Dosing. 10)   Prodigy Onetouch Ultra Meter .... Use For Glucose Monitoring 11)  Zofran 4 Mg Tabs (Ondansetron Hcl) .... One Tab By Mouth Q4h As Needed For Nausea 12)  Wellbutrin 100 Mg Tabs (Bupropion Hcl) .... One Tab By Mouth Two Times A Day X 3d Then Increase To Three Times A Day 13)  Norvasc 5 Mg Tabs (Amlodipine Besylate) .... One Tab By Mouth Daily 14)  Nexium 40 Mg Cpdr (Esomeprazole Magnesium) .... One Tab By Mouth Qhs 15)  Ciprodex 0.3-0.1 % Susp (Ciprofloxacin-Dexamethasone) .... 4 Drops in Each Ear Two Times A Day For 7d  Allergies (verified): No Known Drug Allergies  Social History: Smoking Status:  quit  Review of Systems       See HPI  Physical Exam  General:  Well-developed,well-nourished,in no acute distress; alert,appropriate and cooperative throughout examination Head:  Normocephalic and atraumatic without obvious abnormalities. . Eyes:  No corneal or conjunctival inflammation noted. EOMI. Perrl Ears:  B/L purulence in EAM.  Canals edematous but not swollen shut.  Pus flushed out. TMs clear. Nose:  External nasal examination shows no deformity or inflammation. Nasal mucosa are pink and moist without lesions or exudates. Mouth:  Oral mucosa and oropharynx without lesions or exudates.   Neck:  No deformities, masses, or tenderness noted.  Lungs:  Normal respiratory effort, chest expands symmetrically. Lungs are clear to auscultation, no crackles or wheezes. Heart:  Normal rate and regular rhythm. S1 and S2 normal without gallop, murmur, click, rub or other extra sounds.   Impression & Recommendations:  Problem # 1:  OTITIS EXTERNA, ACUTE (ICD-380.12) Assessment New Bilateral. Flushed out canals so antibiotic can penetrate. Ciprodex x7d. RTC as needed.  His updated medication list for this problem includes:    Ciprodex 0.3-0.1 % Susp (Ciprofloxacin-dexamethasone) .Marland KitchenMarland KitchenMarland KitchenMarland Kitchen 4 drops in each ear two times a day for 7d  Orders: [place]- Est  Level 4 [id number])  Problem # 2:   HYPERTENSION, BENIGN SYSTEMIC (ICD-401.1) Assessment: Improved Controlled, no changes.  His updated medication list for this problem includes:    Toprol Xl 200 Mg Xr24h-tab (Metoprolol succinate) .[name]..[name] Two tabs by mouth daily    Lisinopril-hydrochlorothiazide 20-25 Mg Tabs (Lisinopril-hydrochlorothiazide) ..... One tab by mouth daily    Norvasc 5 Mg Tabs (Amlodipine besylate) ..... One tab by mouth daily  Problem # 3:  DIABETES MELLITUS, TYPE I, UNCONTROLLED, WITH COMPLICATIONS (Q000111Q) Assessment: Improved Doing much better with control. He also feels better with his CBGs controlled. Will recheck A1c in 2 months.  His updated medication list for this problem includes:    Novolog Flexpen 100 Unit/ml Soln (Insulin aspart) ..... Inject 10 units prior to 3 meals per day if pre-meal blood glucose is higher than 120    Lantus Solostar 100 Unit/ml Soln (Insulin glargine) .Marland KitchenMarland KitchenMarland KitchenMarland Kitchen 50 units inj subcutaneously qhs    Lisinopril-hydrochlorothiazide 20-25 Mg Tabs (Lisinopril-hydrochlorothiazide) ..... One tab by mouth daily    Aspirin 81 Mg Tbec (Aspirin) ..... One tab by mouth daily  Orders: [facility]- Est  Level 4 [id number])  Complete Medication List: 1)  Toprol Xl 200 Mg Xr24h-tab (Metoprolol succinate) .... Two tabs by mouth daily 2)  Novolog Flexpen 100 Unit/ml Soln (Insulin aspart) .... Inject 10 units prior to 3 meals per day if pre-meal blood glucose is higher than 120 3)  Lantus Solostar 100 Unit/ml Soln (Insulin glargine) .... 50 units inj subcutaneously qhs 4)  Bd U/f Short Pen Needle 31g X 8 Mm Misc (Insulin pen needle) .... Dispense one box - patient doing 5 shots daily.  dispense 1 box 5)  Percocet 5-325 Mg Tabs (Oxycodone-acetaminophen) .[name].. 1 tab by mouth q 6 hours as needed pain. 6)  Lisinopril-hydrochlorothiazide 20-25 Mg Tabs (Lisinopril-hydrochlorothiazide) .... One tab by mouth daily 7)  Pravastatin Sodium 20 Mg Tabs (Pravastatin sodium) .... One tab by mouth daily 8)  Aspirin 81 Mg  Tbec (Aspirin) .... One tab by mouth daily 9)  Prodigy Lancets 28g Misc (Lancets) .... To be used qac and hs for insulin dosing. 10)  Prodigy Onetouch Ultra Meter  .... Use for glucose monitoring 11)  Zofran 4 Mg Tabs (Ondansetron hcl) .... One tab by mouth q4h as needed for nausea 12)  Wellbutrin 100 Mg Tabs (Bupropion hcl) .... One tab by mouth two times a day x 3d then increase to three times a day 13)  Norvasc 5 Mg Tabs (Amlodipine besylate) .... One tab by mouth daily 14)  Nexium 40 Mg Cpdr (Esomeprazole magnesium) .... One tab by mouth qhs 15)  Ciprodex 0.3-0.1 % Susp (Ciprofloxacin-dexamethasone) .... 4 drops in each ear two times a day for 7d Prescriptions: PERCOCET 5-325 MG TABS (OXYCODONE-ACETAMINOPHEN) 1 tab by mouth q 6 hours as needed pain.  #60 x 0   Entered and Authorized by:   Aundria Mems MD   Signed by:  Aundria Mems MD on 11/12/2010   Method used:   Print then Give to Patient   RxID:   [id number] [place] 0.3-0.1 % SUSP (CIPROFLOXACIN-DEXAMETHASONE) 4 drops in each ear two times a day for 7d  #1 bottle x 0   Entered and Authorized by:   Aundria Mems MD   Signed by:   Aundria Mems MD on 11/12/2010   Method used:   Print then Give to Patient   RxID:   [id number]    Orders Added: 1)  [facility]- Est  Level 4 [id number]

## 2011-01-25 NOTE — Progress Notes (Signed)
Summary: Appt  Phone Note Call from Patient Call back at Work Phone [phone]   Reason for Call: Talk to Doctor Summary of Call: pt called to schedule hospital f/u appt 1st avail is 12/9, pt needs a sooner appt, do you want to double book? Initial call taken by: Samara Snide,  November 22, 2010 2:03 PM  Follow-up for Phone Call        Sure Follow-up by: Aundria Mems MD,  November 22, 2010 4:08 PM  Additional Follow-up for Phone Call Additional follow up Details #1::        appt scheduled 12/1 at 9:30 Additional Follow-up by: Samara Snide,  November 24, 2010 11:30 AM

## 2011-01-25 NOTE — Assessment & Plan Note (Signed)
Summary: bit on back of knee/[place]   Vital Signs:  Patient profile:   35 year old male Height:      73 inches Temp:     98.2 degrees F oral Pulse rate:   92 / minute BP sitting:   190 / 84  (right arm) Cuff size:   regular  Vitals Entered By: Schuyler Amor CMA (September 01, 2010 11:28 AM) CC: bite on back of left leg Is Patient Diabetic? Yes Pain Assessment Patient in pain? yes     Location: left leg Intensity: 10   Primary Care Provider:  Aundria Mems MD  CC:  bite on back of left leg.  History of Present Illness: 1) Swelling / redness left leg: x 3 days. Initially seemed like insect bite but worsened in terms of pain and size of area of swelling and redness. Located medial popliteal, left leg. Never had this before. History of bilateral BKA. Reports chills, nausea, feeling tired. Area has not drained any pus. Got a new prosthetic device on Friday for left leg (has not had issues with this new one)   Denies fever, emesis, diarrhea,    see prior meds for med rec   Medications Prior to Update: 1)  Toprol Xl 200 Mg Xr24h-Tab (Metoprolol Succinate) .... Two Tabs By Mouth Daily 2)  Novolog Flexpen 100 Unit/ml  Soln (Insulin Aspart) .... Inject 10 Units Prior To 3 Meals Per Day If Pre-Meal Blood Glucose Is Higher Than 120 3)  Lantus Solostar 100 Unit/ml  Soln (Insulin Glargine) .... 60 Units Inj Subcutaneously Qhs 4)  Bd U/f Short Pen Needle 31g X 8 Mm  Misc (Insulin Pen Needle) .... Dispense One Box - Patient Doing 5 Shots Daily.  Dispense 1 Box 5)  Percocet 5-325 Mg Tabs (Oxycodone-Acetaminophen) .[name].. 1 Tab By Mouth Q 6 Hours As Needed Pain. 6)  Lisinopril-Hydrochlorothiazide 20-25 Mg Tabs (Lisinopril-Hydrochlorothiazide) .... One Tab By Mouth Daily 7)  Pravastatin Sodium 20 Mg Tabs (Pravastatin Sodium) .... One Tab By Mouth Daily 8)  Aspirin 81 Mg Tbec (Aspirin) .... One Tab By Mouth Daily 9)  Prodigy Lancets 28g  Misc (Lancets) .... To Be Used Qac and Hs  For Insulin Dosing. 10)  Ofloxacin 0.3 % Soln (Ofloxacin) .[name].. 10 Drops in Affected Ear Daily X 7 Days 11)  Prodigy Onetouch Ultra Meter .... Use For Glucose Monitoring  Allergies (verified): No Known Drug Allergies  Physical Exam  General:  pleasant, NAD  Lungs:  Normal respiratory effort, chest expands symmetrically. Lungs are clear to auscultation, no crackles or wheezes. Heart:  Normal rate and regular rhythm. S1 and S2 normal without gallop, murmur, click, rub or other extra sounds. Msk:  B/L BKA.   Skin:  at medial popliteal area left leg there is a 2 cm x 2 cm area of induration / erythema with small (1 mm x 84mm) central ulceration w/ small amount of underlying fluctance. Erythema well circumscribed with margins about 4 cm out from edges of area of induration   I+D performed as below with small amount of purulent material obtained  Inguinal Nodes:  no lymphadenopathy     Impression & Recommendations:  Problem # 1:  CELLULITIS AND ABSCESS OF LEG EXCEPT FOOT (ICD-682.6) Antibiotic coverage as below. I+D performed as above. Unable to collect enough material for culture. Follow up with PCP in 7-10 days. Pateint tolerated procedure well. Crutches given for ambulation given locaction wound, bilateral BKAs.  His updated medication list for this problem includes:  Cipro 500 Mg Tabs (Ciprofloxacin hcl) ..... One tab by mouth two times a day x 10 days    Metronidazole 500 Mg Tabs (Metronidazole) ..... One tab by mouth three times a day x 10 days    Doxycycline Hyclate 100 Mg Caps (Doxycycline hyclate) ..... One tab by mouth two times a day x 10 days  Orders: Culture, Wound -[place] [id number]) Crutches- [place] [id number]) I&D Abcess, simple- [facility] (10060)  Complete Medication List: 1)  Toprol Xl 200 Mg Xr24h-tab (Metoprolol succinate) .... Two tabs by mouth daily 2)  Novolog Flexpen 100 Unit/ml Soln (Insulin aspart) .... Inject 10 units prior to 3 meals per day if pre-meal blood glucose is higher  than 120 3)  Lantus Solostar 100 Unit/ml Soln (Insulin glargine) .... 60 units inj subcutaneously qhs 4)  Bd U/f Short Pen Needle 31g X 8 Mm Misc (Insulin pen needle) .... Dispense one box - patient doing 5 shots daily.  dispense 1 box 5)  Percocet 5-325 Mg Tabs (Oxycodone-acetaminophen) .[name].. 1 tab by mouth q 6 hours as needed pain. 6)  Lisinopril-hydrochlorothiazide 20-25 Mg Tabs (Lisinopril-hydrochlorothiazide) .... One tab by mouth daily 7)  Pravastatin Sodium 20 Mg Tabs (Pravastatin sodium) .... One tab by mouth daily 8)  Aspirin 81 Mg Tbec (Aspirin) .... One tab by mouth daily 9)  Prodigy Lancets 28g Misc (Lancets) .... To be used qac and hs for insulin dosing. 10)  Ofloxacin 0.3 % Soln (Ofloxacin) .[name].. 10 drops in affected ear daily x 7 days 11)  Prodigy Onetouch Ultra Meter  .... Use for glucose monitoring 12)  Cipro 500 Mg Tabs (Ciprofloxacin hcl) .... One tab by mouth two times a day x 10 days 13)  Metronidazole 500 Mg Tabs (Metronidazole) .... One tab by mouth three times a day x 10 days 14)  Doxycycline Hyclate 100 Mg Caps (Doxycycline hyclate) .... One tab by mouth two times a day x 10 days  Patient Instructions: 1)  Come back in one week to see Dr. Darene Lamer to make sure this leg is getting better. 2)  If you have worse pain, swelling or fevers give [place] a call  Prescriptions: PERCOCET 5-325 MG TABS (OXYCODONE-ACETAMINOPHEN) 1 tab by mouth q 6 hours as needed pain.  #20 x 0   Entered and Authorized by:   Mariana Arn  MD   Signed by:   Mariana Arn  MD on 09/01/2010   Method used:   Print then Give to Patient   [id number] DOXYCYCLINE HYCLATE 100 MG CAPS (DOXYCYCLINE HYCLATE) one tab by mouth two times a day x 10 days  #20 x 0   Entered and Authorized by:   Mariana Arn  MD   Signed by:   Mariana Arn  MD on 09/01/2010   Method used:   Print then Give to Patient   RxID:   [id number] METRONIDAZOLE 500 MG TABS (METRONIDAZOLE) one tab by mouth three times a day x 10 days   #30 x 0   Entered and Authorized by:   Mariana Arn  MD   Signed by:   Mariana Arn  MD on 09/01/2010   Method used:   Print then Give to Patient   RxID:   [id number] CIPRO 500 MG TABS (CIPROFLOXACIN HCL) one tab by mouth two times a day x 10 days  #20 x 0   Entered and Authorized by:   Mariana Arn  MD   Signed by:   Mariana Arn  MD on 09/01/2010  Method used:   Print then Give to Patient   RxID:   [id number]        Procedure Note Last Tetanus: given (07/26/2010)  Cyst Removal:  Procedure #1: elliptical incision and removal w/blunt dissection    Comment: consent obtained and signed, time out performed.     Instrument used: 10mm punch    Anesthesia: 1% lidocaine w/epinephrine    Closure: kept open   Cleaned and prepped with: alcohol and betadine Wound dressing: pressure dressing Instructions: RTC in 7-10 days

## 2011-01-25 NOTE — Letter (Signed)
Summary: Probation Letter  [facility]  [address]   [place], [place] [address]   Phone: [phone]  Fax: [phone]    03/14/2010  [name] Tyrell [address] [place], [place]  [address]  Dear [name],  With the goal of better serving all our patients the [facility] is following each patient's missed appointments.  You have missed at least 3 appointments with our practice.If you cannot keep your appointment, we expect you to call at least 24 hours before your appointment time.  Missing appointments prevents other patients from seeing [place] and makes it difficult to provide you with the best possible medical care.      1.   If you miss one more appointment, we will only give you limited medical services. This means we will not call in medication refills, complete a form, or make a referral for you except when you are here for a scheduled office visit.    2.   If you miss 2 or more appointments in the next year, we will dismiss you from our practice.    Our office staff can be reached at [phone] Monday through Friday from 8:30 a.m.-5:00 p.m. and will be glad to schedule your appointment as necessary.    Thank you.   [facility]  Appended Document: Probation Letter cert. mailed  Appended Document: Probation Letter letter returned unable to forward.

## 2011-01-25 NOTE — Progress Notes (Signed)
 Summary: phn msg  Phone Note Call from Patient Call back at [phone]   Caller: spouse-Chanelle Summary of Call: wife wondering what Dr. meant when he told Dorn he needed to get his affairs in order.   Initial call taken by: Madelin Daring,  November 25, 2009 9:55 AM  Follow-up for Phone Call        Not sure what she is talking about but his renal function was deteriorating and he has missed his appts for a BMET.  He needs to come in ASAP for this. Follow-up by: Debby Petties MD,  November 25, 2009 1:53 PM  Additional Follow-up for Phone Call Additional follow up Details #1::        spoke with wife. told her not sure about getting affairs in order. he needs to call & make appt as he has missed some. she will tell him to call Additional Follow-up by: Ginnie Mau RN,  November 25, 2009 4:40 PM    Additional Follow-up for Phone Call Additional follow up Details #2::    called & spoke with wife who asked that we call him at [phone]. I called him & he already has an appt on the 6th Follow-up by: Ginnie Mau RN,  November 26, 2009 3:38 PM  Additional Follow-up for Phone Call Additional follow up Details #3:: Details for Additional Follow-up Action Taken: Thanks, can check BMET then. Additional Follow-up by: Debby Petties MD,  November 27, 2009 12:35 PM

## 2011-01-25 NOTE — Progress Notes (Signed)
 Summary: triage  Phone Note Call from Patient Call back at [phone]   Caller: Patient Summary of Call: fell down steps today and bruised both stumps and requesting pain meds.  Pt uses [facility]. Initial call taken by: Madelin Daring,  November 02, 2009 1:53 PM  Follow-up for Phone Call        wants pain meds. states otc do nothing. refused appt since he cannot put his legs on. to pcp. told him I will call him when md responds Follow-up by: Ginnie Mau RN,  November 02, 2009 2:08 PM  Additional Follow-up for Phone Call Additional follow up Details #1::        Will send in script for ultracet, should try to come in when possible. Additional Follow-up by: Debby Petties MD,  November 04, 2009 7:28 AM    New/Updated Medications: ULTRACET 37.5-325 MG TABS (TRAMADOL-ACETAMINOPHEN ) One tab by mouth q4-6h as needed for pain Prescriptions: ULTRACET 37.5-325 MG TABS (TRAMADOL-ACETAMINOPHEN ) One tab by mouth q4-6h as needed for pain  #45 x 0   Entered and Authorized by:   Debby Petties MD   Signed by:   Debby Petties MD on 11/04/2009   Method used:   Telephoned to ...       [facility] (retail)       [address] [place]       [address]       [place], [place]  [address]       Ph: [phone]       Fax: [phone]   RxID:   [id number]   Appended Document: triage told pt rx at pharmacy. stated he has had this before & it will not help. states percocet is the only drug that really makes a difference. has appt Monday here  Appended Document: triage Will discuss with him Monday.  Appended Document: triage patient notified that  MD advises to drink > 8 glasses of water a day. come back ASAP for labs. he cannot come back in until tomorrow. Dr. Petties notified and advises this is ok but will need to get labs STAT when he comes in.  Appended Document: triage the above note was appended under the incorrect office note. should be  under lab results from  11/17/2009.

## 2011-01-25 NOTE — Assessment & Plan Note (Signed)
Summary: Hospital H&P   Visit Type:  Hospital H and P Primary Care Provider:  Aundria Mems MD   History of Present Illness: Pt is a 35 yo M w/ past medical hx of DM and HTN who woke up this morning with subjective fever, nausea, vomiting and diarrhea. His emesis progressed from clear to blood-tinged and coffee ground consistency. He has had a similar episode in the past. He had some CP associated with the vomiting but denies current CP. He feels lightheaded when he sits up. He denies any abd pain or SOB. He last took his Lantus yesterday morning. Pt intially presented to [facility] for work-up, admission requested by [facility] for hyperglycemia mangent.   Habits & Providers  Alcohol-Tobacco-Diet     Alcohol drinks/day: 0     Tobacco Status: quit  Exercise-Depression-Behavior     Drug Use: marijuanna  Current Problems (verified): 1)  Otitis Externa, Acute  (ICD-380.12) 2)  Chronic Kidney Disease Stage Iii (MODERATE)  (ICD-585.3) 3)  Amputation, Below Knee, Hx of  (ICD-V49.75) 4)  Post Traumatic Stress Disorder  (ICD-309.81) 5)  Impotence, Organic  (ICD-607.84) 6)  Hypertension, Benign Systemic  (ICD-401.1) 7)  Hyperlipidemia  (ICD-272.4) 8)  Diabetes Mellitus, Type I, Uncontrolled, With Complications  (Q000111Q) 9)  Depression, Major, Recurrent  (ICD-296.30) 10)  Gerd  (ICD-530.81)  Current Medications (verified): 1)  Toprol Xl 200 Mg Xr24h-Tab (Metoprolol Succinate) .... Two Tabs By Mouth Daily 2)  Novolog Flexpen 100 Unit/ml  Soln (Insulin Aspart) .... Inject 10 Units Prior To 3 Meals Per Day If Pre-Meal Blood Glucose Is Higher Than 120 3)  Lantus Solostar 100 Unit/ml  Soln (Insulin Glargine) .... 50 Units Inj Subcutaneously Qhs 4)  Bd U/f Short Pen Needle 31g X 8 Mm  Misc (Insulin Pen Needle) .... Dispense One Box - Patient Doing 5 Shots Daily.  Dispense 1 Box 5)  Percocet 5-325 Mg Tabs (Oxycodone-Acetaminophen) .[name].. 1 Tab By Mouth Q 6 Hours As Needed Pain. 6)   Lisinopril-Hydrochlorothiazide 20-25 Mg Tabs (Lisinopril-Hydrochlorothiazide) .... One Tab By Mouth Daily 7)  Pravastatin Sodium 20 Mg Tabs (Pravastatin Sodium) .... One Tab By Mouth Daily 8)  Aspirin 81 Mg Tbec (Aspirin) .... One Tab By Mouth Daily 9)  Prodigy Lancets 28g  Misc (Lancets) .... To Be Used Qac and Hs For Insulin Dosing. 10)  Prodigy Onetouch Ultra Meter .... Use For Glucose Monitoring 11)  Zofran 4 Mg Tabs (Ondansetron Hcl) .... One Tab By Mouth Q4h As Needed For Nausea 12)  Wellbutrin 100 Mg Tabs (Bupropion Hcl) .... One Tab By Mouth Two Times A Day X 3d Then Increase To Three Times A Day 13)  Norvasc 5 Mg Tabs (Amlodipine Besylate) .... One Tab By Mouth Daily 14)  Nexium 40 Mg Cpdr (Esomeprazole Magnesium) .... One Tab By Mouth Qhs 15)  Ciprodex 0.3-0.1 % Susp (Ciprofloxacin-Dexamethasone) .... 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) HYPERTENSION, BENIGN SYSTEMIC (ICD-401.1) DIABETES MELLITUS, I (ICD-250.01) DEPRESSION, MAJOR, RECURRENT (ICD-296.30) HYPERTENSION (ICD-401.9) GERD (ICD-530.81) OSTEOMYELITIS, CHRONIC, 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  Previous use MJ, 2 "blunts" or "cigars" a day.  Denies ETOH use. Pt lives in an apartment with his mother Per past hospital chart, occasional crack/cocaine history but pt denies this.   Risk Factors: Alcohol Use: 0 (11/15/2010)  Risk Factors: Smoking Status: quit (11/15/2010) Packs/Day: <0.25 (10/08/2010)  Social History: Reviewed history from 12/30/2009 and no changes required. quit smoking cigs 3/09.  Previous use MJ, 2 "blunts" or "cigars" a day.  Denies ETOH use. Pt lives in an apartment with his mother Per past hospital chart, occasional crack/cocaine history but pt denies this. Drug Use:  marijuanna  Review of Systems       The patient complains of fever and hemoptysis.  The patient denies chest pain and abdominal pain.    Physical Exam  General:  VS reviewed - hypertensive alert, well-developed, and well-nourished.   Head:  Normocephalic and atraumatic without obvious abnormalities. . Eyes:  vision grossly intact.   Ears:  no external deformities.   Nose:  no external deformity.   Mouth:  pharyngeal erythema, teeth missing, edentulous, and pharyngeal exudate.   Neck:  No deformities, masses, or tenderness noted. Chest Wall:  No deformities, masses, tenderness or gynecomastia noted. Lungs:  Normal respiratory effort, chest expands symmetrically. Lungs are clear to auscultation, no crackles or wheezes. Heart:  Normal rate and regular rhythm. S1 and S2 normal without gallop, murmur, click, rub or other extra sounds. Abdomen:  Bowel sounds positive,abdomen soft and non-tender without masses, organomegaly or hernias noted. Msk:  B/L BKA.     Labs:  CBC w/ diff - normal ISTAT - glu 487, BUN 28, Cr 3.8 POC CE - negative U/A - > 1000 glu, neg ketones, > 300 protein Urine cx - pending  CXR: normal portable chest x ray   Impression &  Recommendations:  Problem # 1:  DIABETES MELLITUS, TYPE I, UNCONTROLLED, WITH COMPLICATIONS (Q000111Q) Assessment Deteriorated Blood sugar was 522 upon admission at [place]> 373 -> 243. Plan to continue SSI moderate dosed and continue home lantus of 50 units. CBG checks Q 4 hours. BMP now and replete electrolytes as needed. Pt to tele bed.  His updated medication list for this problem includes:    Novolog Flexpen 100 Unit/ml Soln (Insulin aspart) ..... Inject 10 units prior to 3 meals per day if pre-meal blood glucose is higher than 120    Lantus Solostar 100 Unit/ml Soln (Insulin glargine) .Marland KitchenMarland KitchenMarland KitchenMarland Kitchen 50 units inj subcutaneously qhs    Lisinopril-hydrochlorothiazide 20-25 Mg Tabs (Lisinopril-hydrochlorothiazide) ..... One tab by mouth daily    Aspirin 81 Mg Tbec (Aspirin) ..... One tab by mouth daily  Problem # 2:  HYPERTENSION, BENIGN SYSTEMIC (ICD-401.1) Initial BP was 169/108 at [facility]. Pt denies any CP. Restart home BP meds as tolerated. Carefully monitor BP to not drop significantly.  His updated medication list for this problem includes:    Toprol Xl 200 Mg Xr24h-tab (Metoprolol succinate) .[name]..[name] Two tabs by mouth daily    Lisinopril-hydrochlorothiazide 20-25 Mg Tabs (Lisinopril-hydrochlorothiazide) ..... One tab by mouth daily    Norvasc 5 Mg Tabs (Amlodipine besylate) ..... One tab by mouth daily  Problem # 3:  SORE THROAT (ICD-462) Strep screen. Treat if positive.  His updated medication list for this problem includes:    Aspirin 81 Mg Tbec (Aspirin) ..... One tab by mouth daily  Problem # 4:  OTITIS EXTERNA, ACUTE (ICD-380.12) Still on Cipro. Continue 7 day course.  His updated medication list for this problem includes:    Ciprodex 0.3-0.1 % Susp (Ciprofloxacin-dexamethasone) .Marland KitchenMarland KitchenMarland KitchenMarland Kitchen 4 drops in each ear two times a day for 7d  Problem # 5:  CHRONIC KIDNEY DISEASE STAGE III (MODERATE) (ICD-585.3) ISTAT chem showed Cr to be 3.8. Repeat now.   Problem # 6:   AMPUTATION, BELOW KNEE, HX OF (ICD-V49.75) Assessment: Unchanged  Problem # 7:  DEPRESSION, MAJOR, RECURRENT (ICD-296.30) Assessment: Unchanged Continue home meds.   Problem # 8:  GERD (ICD-530.81) Continue home meds.  His updated medication list for this problem includes:    Nexium 40 Mg Cpdr (Esomeprazole magnesium) ..... One tab by mouth qhs  Problem # 9:  fen/gi IV fluids saline locked. Clears as tolerated.  CMP now. Replete electrolytes as needed. GI ppx: Nexium  Problem # 10:  VTE prophylaxis Heparin   Problem # 11:  code Full  Problem # 12:  Dispo Pending clinical improvement  Complete Medication List: 1)  Toprol Xl 200 Mg Xr24h-tab (Metoprolol succinate) .... Two tabs by mouth daily 2)  Novolog Flexpen 100 Unit/ml Soln (Insulin aspart) .... Inject 10 units prior to 3 meals per day if pre-meal blood glucose is higher than 120 3)  Lantus Solostar 100 Unit/ml Soln (Insulin glargine) .... 50 units inj subcutaneously qhs 4)  Bd U/f Short Pen Needle 31g X 8 Mm Misc (Insulin pen needle) .... Dispense one box - patient doing 5 shots daily.  dispense 1 box 5)  Percocet 5-325 Mg Tabs (Oxycodone-acetaminophen) .[name].. 1 tab by mouth q 6 hours as needed pain. 6)  Lisinopril-hydrochlorothiazide 20-25 Mg Tabs (Lisinopril-hydrochlorothiazide) .... One tab by mouth daily 7)  Pravastatin Sodium 20 Mg Tabs (Pravastatin sodium) .... One tab by mouth daily 8)  Aspirin 81 Mg Tbec (Aspirin) .... One tab by mouth daily 9)  Prodigy Lancets 28g Misc (Lancets) .... To be used qac and hs for insulin dosing. 10)  Prodigy Onetouch Ultra Meter  .... Use for glucose monitoring 11)  Zofran 4 Mg Tabs (Ondansetron hcl) .... One tab by mouth q4h as needed for nausea 12)  Wellbutrin 100 Mg Tabs (Bupropion hcl) .... One tab by mouth two times a day x 3d then increase to three times a day 13)  Norvasc 5 Mg Tabs (Amlodipine besylate) .... One tab by mouth daily 14)  Nexium 40 Mg Cpdr (Esomeprazole magnesium)  .... One tab by mouth qhs 15)  Ciprodex 0.3-0.1 % Susp (Ciprofloxacin-dexamethasone) .... 4 drops in each ear two times a day for 7d

## 2011-01-25 NOTE — Progress Notes (Signed)
 Summary: triage  Phone Note Call from Patient Call back at [phone]   Caller: Patient Summary of Call: please see note from Dr Hardy from yesterday.  His bp is up he is vomiting something like coffee grounds.  He says he has a ride today if he can see the doctor. Initial call taken by: Madelin Daring,  September 10, 2009 1:50 PM  Follow-up for Phone Call        see on call note from Dr. Hardy. sent him to ED. he has a ride & agrees to go now Follow-up by: Ginnie Mau RN,  September 10, 2009 1:53 PM  Additional Follow-up for Phone Call Additional follow up Details #1::        Agreed, this needs evaluation now. Additional Follow-up by: Debby Petties MD,  September 10, 2009 2:22 PM

## 2011-01-25 NOTE — Assessment & Plan Note (Signed)
Summary: F/U/KH   Vital Signs:  Patient profile:   35 year old male Weight:      159.2 pounds Temp:     98.9 degrees F oral Pulse rate:   92 / minute BP sitting:   167 / 110  (right arm) Cuff size:   regular  Vitals Entered By: Audelia Hives CMA (August 17, 2010 2:12 PM) CC: follow-up visit,dm Is Patient Diabetic? Yes Did you bring your meter with you today? No Comments pt has been out of meds for 1week,    Primary Care Provider:  Aundria Mems MD  CC:  follow-up visit and dm.  History of Present Illness: 35 yo male  Ear pain:  Present several days now, L ear, affects hearing, no drainage, no fevers, no mastoid pain.  HTN:  Didnt take meds for a week.  Lisinopril added to regimen 2wk ago.  DM2:  No meter, still has some insulin.    Prostheses: Mold made of stumps, getting new prostheses next week.    Current Medications (verified): 1)  Toprol Xl 200 Mg Xr24h-Tab (Metoprolol Succinate) .... Two Tabs By Mouth Daily 2)  Novolog Flexpen 100 Unit/ml  Soln (Insulin Aspart) .... Inject 10 Units Prior To 3 Meals Per Day If Pre-Meal Blood Glucose Is Higher Than 120 3)  Lantus Solostar 100 Unit/ml  Soln (Insulin Glargine) .... 60 Units Inj Subcutaneously Qhs 4)  Bd U/f Short Pen Needle 31g X 8 Mm  Misc (Insulin Pen Needle) .... Dispense One Box - Patient Doing 5 Shots Daily.  Dispense 1 Box 5)  Percocet 5-325 Mg Tabs (Oxycodone-Acetaminophen) .[name].. 1 Tab By Mouth Q 6 Hours As Needed Pain. 6)  Lisinopril-Hydrochlorothiazide 20-25 Mg Tabs (Lisinopril-Hydrochlorothiazide) .... One Tab By Mouth Daily 7)  Pravastatin Sodium 20 Mg Tabs (Pravastatin Sodium) .... One Tab By Mouth Daily 8)  Aspirin 81 Mg Tbec (Aspirin) .... One Tab By Mouth Daily 9)  Prodigy Lancets 28g  Misc (Lancets) .... To Be Used Qac and Hs For Insulin Dosing. 10)  Ofloxacin 0.3 % Soln (Ofloxacin) .[name].. 10 Drops in Affected Ear Daily X 7 Days 11)  Prodigy Onetouch Ultra Meter .... Use For Glucose  Monitoring  Allergies (verified): No Known Drug Allergies  Review of Systems       See HPI  Physical Exam  General:  Well-developed,well-nourished,in no acute distress; alert,appropriate and cooperative throughout examination Ears:  Purulence noted in L canal, canal swollen.  No mastoid tenderness.  Canals flushed, copious amounts of pus removed.  TMs visualized and pearly gray. Neck:  No deformities, masses, or tenderness noted. Lungs:  Normal respiratory effort, chest expands symmetrically. Lungs are clear to auscultation, no crackles or wheezes. Heart:  Normal rate and regular rhythm. S1 and S2 normal without gallop, murmur, click, rub or other extra sounds.   Impression & Recommendations:  Problem # 1:  OTITIS EXTERNA, ACUTE, LEFT (ICD-380.12) Assessment New Flushed ears, all pus and wax removed so abx can enter.   Rx: Ofloxacin drops x 7d. RTC if no better. No signs mastoiditis.  Orders: [place]- Est  Level 4 [id number])  Problem # 2:  HYPERTENSION, BENIGN SYSTEMIC (ICD-401.1) Assessment: Deteriorated Refilled all meds, pt to RTC 2 wks for RN BP check.  He is non-compliant with meds.  His updated medication list for this problem includes:    Toprol Xl 200 Mg Xr24h-tab (Metoprolol succinate) .[name]..[name] Two tabs by mouth daily    Lisinopril-hydrochlorothiazide 20-25 Mg Tabs (Lisinopril-hydrochlorothiazide) ..... One tab by mouth daily  Orders:  [place]- Est  Level 4 (99214)  Problem # 3:  DIABETES MELLITUS, TYPE I, UNCONTROLLED, WITH COMPLICATIONS (Q000111Q) Assessment: Unchanged Last A1c in the 12's, pt does not take insulin as directed as his CBGs were normal in the hospital on the same regimen.  He lost his meter. Will rx new meter.  Pt to RTC with meter within 3 mos.  Checking BMET as lisinopril recently started.  His updated medication list for this problem includes:    Novolog Flexpen 100 Unit/ml Soln (Insulin aspart) ..... Inject 10 units prior to 3 meals per day if pre-meal  blood glucose is higher than 120    Lantus Solostar 100 Unit/ml Soln (Insulin glargine) .Marland KitchenMarland KitchenMarland KitchenMarland Kitchen 60 units inj subcutaneously qhs    Lisinopril-hydrochlorothiazide 20-25 Mg Tabs (Lisinopril-hydrochlorothiazide) ..... One tab by mouth daily    Aspirin 81 Mg Tbec (Aspirin) ..... One tab by mouth daily  Orders: [facility]- Est  Level 4 [id number])  Complete Medication List: 1)  Toprol Xl 200 Mg Xr24h-tab (Metoprolol succinate) .... Two tabs by mouth daily 2)  Novolog Flexpen 100 Unit/ml Soln (Insulin aspart) .... Inject 10 units prior to 3 meals per day if pre-meal blood glucose is higher than 120 3)  Lantus Solostar 100 Unit/ml Soln (Insulin glargine) .... 60 units inj subcutaneously qhs 4)  Bd U/f Short Pen Needle 31g X 8 Mm Misc (Insulin pen needle) .... Dispense one box - patient doing 5 shots daily.  dispense 1 box 5)  Percocet 5-325 Mg Tabs (Oxycodone-acetaminophen) .[name].. 1 tab by mouth q 6 hours as needed pain. 6)  Lisinopril-hydrochlorothiazide 20-25 Mg Tabs (Lisinopril-hydrochlorothiazide) .... One tab by mouth daily 7)  Pravastatin Sodium 20 Mg Tabs (Pravastatin sodium) .... One tab by mouth daily 8)  Aspirin 81 Mg Tbec (Aspirin) .... One tab by mouth daily 9)  Prodigy Lancets 28g Misc (Lancets) .... To be used qac and hs for insulin dosing. 10)  Ofloxacin 0.3 % Soln (Ofloxacin) .[name].. 10 drops in affected ear daily x 7 days 11)  Prodigy Onetouch Ultra Meter  .... Use for glucose monitoring  Other Orders: [facility] [id number]  Patient Instructions: 1)  Great to see you, 2)  Fill all your meds, then take daily, then come back for an RN BP check in 2 weeks. 3)  Use the ear drops as directed. 4)  Checking bloodwork today. 5)  Come back to see me in just under 3 months. 6)  -Dr. Darene Lamer. Prescriptions: PRODIGY ONETOUCH ULTRA METER Use for glucose monitoring  #1 x 0   Entered and Authorized by:   Aundria Mems MD   Signed by:   Aundria Mems MD on 08/17/2010   Method used:   Print  then Give to Patient   RxID:   [id number] PERCOCET 5-325 MG TABS (OXYCODONE-ACETAMINOPHEN) 1 tab by mouth q 6 hours as needed pain.  #60 x 0   Entered and Authorized by:   Aundria Mems MD   Signed by:   Aundria Mems MD on 08/17/2010   Method used:   Print then Give to Patient   RxID:   [id number] OFLOXACIN 0.3 % SOLN (OFLOXACIN) 10 drops in affected ear daily x 7 days  #1 bottle x 0   Entered and Authorized by:   Aundria Mems MD   Signed by:   Aundria Mems MD on 08/17/2010   Method used:   Print then Give to Patient   RxID:   [id number] ASPIRIN 81 MG TBEC (ASPIRIN) One tab by mouth daily  #90  x 0   Entered and Authorized by:   Aundria Mems MD   Signed by:   Aundria Mems MD on 08/17/2010   Method used:   Faxed to ...       [facility] (retail)       [place], [place]  [address]       Ph: [phone]       Fax: [phone]   RxID:   [id number] PRAVASTATIN SODIUM 20 MG TABS (PRAVASTATIN SODIUM) One tab by mouth daily  #90 x 0   Entered and Authorized by:   Aundria Mems MD   Signed by:   Aundria Mems MD on 08/17/2010   Method used:   Faxed to ...       [facility] (retail)       [address]       [place], [place]  [address]       Ph: [phone]       Fax: [phone]   RxID:   [id number] LISINOPRIL-HYDROCHLOROTHIAZIDE 20-25 MG TABS (LISINOPRIL-HYDROCHLOROTHIAZIDE) One tab by mouth daily  #90 x 0   Entered and Authorized by:   Aundria Mems MD   Signed by:   Aundria Mems MD on 08/17/2010   Method used:   Faxed to ...       [facility] (retail)       [address]       [place], [place]  [address]       Ph: [phone]       Fax: [phone]   RxID:   [id number] TOPROL XL 200 MG XR24H-TAB (METOPROLOL SUCCINATE) Two tabs by mouth daily  #180 x 0   Entered and Authorized by:   Aundria Mems MD   Signed by:   Aundria Mems MD on 08/17/2010   Method used:   Faxed to ...       [facility] (retail)       [address]       [place], [place]  [address]       Ph: [phone]       Fax: [phone]   RxID:   [id number]

## 2011-01-25 NOTE — Assessment & Plan Note (Signed)
Summary: f/up,tcb   Vital Signs:  Patient profile:   35 year old male Height:      73 inches Weight:      154 pounds BMI:     20.39 Temp:     98.7 degrees F oral Pulse rate:   91 / minute BP sitting:   153 / 94  (left arm) Cuff size:   regular  Vitals Entered By: Enid Skeens, CMA (July 26, 2010 9:40 AM) CC: f/u Is Patient Diabetic? Yes Did you bring your meter with you today? No Pain Assessment Patient in pain? yes      Intensity: 6   Primary Care Provider:  Aundria Mems MD  CC:  f/u.  History of Present Illness: HTN:  BP high but better than usual.  Taking all meds as rx'ed.  HLD:  Last check ok, off pravastatin 2/2 cramps.   DM1:  Taking Lnatus 30 two times a day, novolog with meals.  Lowest: 29, was sweaty, jittery, took candy and sugar water and felt better.  Skipped breakfast, says it makes him nauseus but will try to drink an ensure for breakfast.  Usual CBGs in the AM run 250's-300s.  Doesn't have meter and says it doesn't work.  Trying to get another.  Has never seen Dr. Valentina Lucks.  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  Going to prostetist today to have them reworked.  Habits & Providers  Alcohol-Tobacco-Diet     Tobacco Status: current  Current Medications (verified): 1)  Toprol Xl 200 Mg Xr24h-Tab (Metoprolol Succinate) .... Two Tabs By Mouth Daily 2)  Novolog Flexpen 100 Unit/ml  Soln (Insulin Aspart) .... Inject 10 Units Prior To 3 Meals Per Day If Pre-Meal Blood Glucose Is Higher Than 120 3)  Lantus Solostar 100 Unit/ml  Soln (Insulin Glargine) .... 60 Units Inj Subcutaneously Qhs 4)  Bd U/f Short Pen Needle 31g X 8 Mm  Misc (Insulin Pen Needle) .... Dispense One Box - Patient Doing 5 Shots Daily.  Dispense 1 Box 5)  Percocet 5-325 Mg Tabs (Oxycodone-Acetaminophen) .[name].. 1 Tab By Mouth Q 6 Hours As Needed Pain. 6)  Lisinopril-Hydrochlorothiazide 20-25 Mg Tabs (Lisinopril-Hydrochlorothiazide) .... One Tab By Mouth Daily 7)   Pravastatin Sodium 20 Mg Tabs (Pravastatin Sodium) .... One Tab By Mouth Daily 8)  Aspirin 81 Mg Tbec (Aspirin) .... One Tab By Mouth Daily 9)  Prodigy Lancets 28g  Misc (Lancets) .... To Be Used Qac and Hs For Insulin Dosing.  Allergies (verified): No Known Drug Allergies  Review of Systems       See HPI  Physical Exam  General:  Well-developed,well-nourished,in no acute distress; alert,appropriate and cooperative throughout examination Lungs:  Normal respiratory effort, chest expands symmetrically. Lungs are clear to auscultation, no crackles or wheezes. Heart:  Normal rate and regular rhythm. S1 and S2 normal without gallop, murmur, click, rub or other extra sounds. Abdomen:  Bowel sounds positive,abdomen soft and non-tender without masses, organomegaly or hernias noted. Extremities:  B/l BKA.  No skin breakdown noted at painful site (Right fibular stump).  Lots of callous.  Legs slip down too far into prosthetic.  has to use lots of padding to keep his legs high enough.   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  Will let me know if they don't fix it for him.  No signs skin breakdown, he keeps them very clean.  Orders:  [place]- Est  Level 4 (99214)  Problem # 2:  HYPERTENSION, BENIGN SYSTEMIC (ICD-401.1) Assessment: Improved Elevated but better today.  Restarted ACE today.  Last creat 1.75 on Dec 05, 2009 in hospital.  He tends to run 1.7-2.0. Will recheck BMET in 2 wks.  His updated medication list for this problem includes:    Toprol Xl 200 Mg Xr24h-tab (Metoprolol succinate) .[name]..[name] Two tabs by mouth daily    Lisinopril-hydrochlorothiazide 20-25 Mg Tabs (Lisinopril-hydrochlorothiazide) ..... One tab by mouth daily  Orders: [place]- Est  Level 4 [id number])  Problem # 3:  HYPERLIPIDEMIA (ICD-272.4) Assessment: Unchanged Restarted statin, recheck FLP in 3 months.  His updated medication  list for this problem includes:    Pravastatin Sodium 20 Mg Tabs (Pravastatin sodium) ..... One tab by mouth daily  Orders: [place]- Est  Level 4 [id number])  Problem # 4:  DIABETES MELLITUS, TYPE I, UNCONTROLLED, WITH COMPLICATIONS (Q000111Q) Assessment: Unchanged Taking insulin as directed.  no refills needed.  Will bring meter at next visit for me to review.  Also scheduled him for diabetes clinic with Dr. Valentina Lucks.  Was taking Lantus two times a day, advised take entire dose qHS to avoid multiple sticks.  Also advised not to miss meals and use glucerna for breakfast instead of ensure.  A1c 12 today.    I cannot treat his diabetes until he brings me his meter so I can see how often he checks his sugars and what they have been running.  His updated medication list for this problem includes:    Novolog Flexpen 100 Unit/ml Soln (Insulin aspart) ..... Inject 10 units prior to 3 meals per day if pre-meal blood glucose is higher than 120    Lantus Solostar 100 Unit/ml Soln (Insulin glargine) .Marland KitchenMarland KitchenMarland KitchenMarland Kitchen 60 units inj subcutaneously qhs    Lisinopril-hydrochlorothiazide 20-25 Mg Tabs (Lisinopril-hydrochlorothiazide) ..... One tab by mouth daily    Aspirin 81 Mg Tbec (Aspirin) ..... One tab by mouth daily  Orders: [facility]1C-[facility] [id number]) [place]- Est  Level 4 [id number])  Problem # 5:  Preventive Health Care (ICD-V70.0) Assessment: Comment Only Due for TDap, given.  Complete Medication List: 1)  Toprol Xl 200 Mg Xr24h-tab (Metoprolol succinate) .... Two tabs by mouth daily 2)  Novolog Flexpen 100 Unit/ml Soln (Insulin aspart) .... Inject 10 units prior to 3 meals per day if pre-meal blood glucose is higher than 120 3)  Lantus Solostar 100 Unit/ml Soln (Insulin glargine) .... 60 units inj subcutaneously qhs 4)  Bd U/f Short Pen Needle 31g X 8 Mm Misc (Insulin pen needle) .... Dispense one box - patient doing 5 shots daily.  dispense 1 box 5)  Percocet 5-325 Mg Tabs (Oxycodone-acetaminophen) .[name].. 1 tab by mouth q 6 hours as  needed pain. 6)  Lisinopril-hydrochlorothiazide 20-25 Mg Tabs (Lisinopril-hydrochlorothiazide) .... One tab by mouth daily 7)  Pravastatin Sodium 20 Mg Tabs (Pravastatin sodium) .... One tab by mouth daily 8)  Aspirin 81 Mg Tbec (Aspirin) .... One tab by mouth daily 9)  Prodigy Lancets 28g Misc (Lancets) .... To be used qac and hs for insulin dosing.  Other Orders: Tdap => 22yrs IM [id number]) Admin 1st Vaccine [phone])  Patient Instructions: 1)  Great to see you today, 2)  Get your prosthesis fixed, if they won't fix it then have them call me. 3)  Refilled percocet, pravastatin, changed from HCTZ to HCTZ/lisinopril combo (Start taking this instead of HCTZ) 4)  Take Lantus at 60 units at bedtime instead of 30 units two times a day.  5)  Make an appt at the front to see Dr. Valentina Lucks in diabetes clinic. 6)  BE SURE TO GET YOUR METER, CHECK YOUR SUGARS, AND BRING YOUR METER TO APPTS. 7)  Glucerna for breakfast instead of ensure (less sugar in Glucerna) 8)  Come back to see me in 2 weeks. 9)  -Dr. Darene Lamer. Prescriptions: PERCOCET 5-325 MG TABS (OXYCODONE-ACETAMINOPHEN) 1 tab by mouth q 6 hours as needed pain.  #60 x 0   Entered and Authorized by:   Aundria Mems MD   Signed by:   Aundria Mems MD on 07/26/2010   Method used:   Print then Give to Patient   RxID:   [id number] PRAVASTATIN SODIUM 20 MG TABS (PRAVASTATIN SODIUM) One tab by mouth daily  #30 x 0   Entered and Authorized by:   Aundria Mems MD   Signed by:   Aundria Mems MD on 07/26/2010   Method used:   Faxed to ...       [facility] (retail)       [address]       [place], [place]  [address]       Ph: [phone]       Fax: [phone]   RxID:   [id number] LISINOPRIL-HYDROCHLOROTHIAZIDE 20-25 MG TABS (LISINOPRIL-HYDROCHLOROTHIAZIDE) One tab by mouth daily  #30 x 0   Entered and Authorized by:   Aundria Mems MD   Signed by:   Aundria Mems MD on 07/26/2010   Method  used:   Faxed to ...       [facility] (retail)       [address]       [place], [place]  [address]       Ph: [phone]       Fax: [phone]   RxID:   [id number]   Last TD:  Done. (05/26/1998 12:00:00 AM) TD Result Date:  07/26/2010 TD Result:  given TD Next Due:  10 yr Diabetes Foot Check Next Due:  Not Indicated Last HGBA1C:  11.6 (11/30/2009 1:23:24 PM) HGBA1C Result Date:  07/26/2010 HGBA1C Next Due:  3 mo Last Creatinine:  2.09 (12/07/2009 11:00:00 PM) Creatinine Next Due: 1 yr Last Potassium:  4.7 (12/07/2009 11:00:00 PM) Potassium Next Due:  1 yr    Immunizations Administered:  Tetanus Vaccine:    Vaccine Type: Tdap    Site: right deltoid    Mfr: GlaxoSmithKline    Dose: 0.5 ml    Route: IM    Given by: Audelia Hives CMA    Exp. Date: 06/24/2012    Lot #: [id number]    VIS given: 11/13/07 version given July 26, 2010.   Laboratory Results   Blood Tests   Date/Time Received: July 26, 2010 10:08 AM    Date/Time Reported: July 26, 2010 10:19 AM   HGBA1C: 12.8%   (Normal Range: Non-Diabetic - 3-6%   Control Diabetic - 6-8%)  Comments: test performed by Audelia Hives CMA

## 2011-01-26 ENCOUNTER — Encounter (HOSPITAL_BASED_OUTPATIENT_CLINIC_OR_DEPARTMENT_OTHER): Payer: Medicare Other | Attending: General Surgery

## 2011-01-26 DIAGNOSIS — E119 Type 2 diabetes mellitus without complications: Secondary | ICD-10-CM | POA: Insufficient documentation

## 2011-01-26 DIAGNOSIS — T8789 Other complications of amputation stump: Secondary | ICD-10-CM | POA: Insufficient documentation

## 2011-01-26 DIAGNOSIS — Y835 Amputation of limb(s) as the cause of abnormal reaction of the patient, or of later complication, without mention of misadventure at the time of the procedure: Secondary | ICD-10-CM | POA: Insufficient documentation

## 2011-01-27 NOTE — Assessment & Plan Note (Signed)
Summary: hosp f/u per Dr. Daneen Schick   Vital Signs:  Patient profile:   35 year old male Height:      73 inches Weight:      175.6 pounds BMI:     23.25 Temp:     98.5 degrees F oral Pulse rate:   96 / minute BP sitting:   170 / 104  (left arm) Cuff size:   regular  Vitals Entered By: Levert Feinstein LPN (December  1, 624THL 9:00 AM) CC: hfu Is Patient Diabetic? Yes Did you bring your meter with you today? No Pain Assessment Patient in pain? yes     Location: legs   Primary Care Koa Zoeller:  Aundria Mems MD  CC:  hfu.  History of Present Illness: 35 yo male here for [place].    ARF:  Creat baseline <1.5, up to mid 3's in hospital.  Still taking lisinopril/hctz per pt.  BPs elevated.  Voiding ok though.    HTN:  Elevated.  Taking all meds.  Ear pain:  s/p ciprodex course, still with pain, bilateral but also having throat pain.  ST:  Present for weeks now.  No better with nexium 40 qHS.  No cough, no fevers, no sick contacts.  He is sexually active orally with partner.    Habits & Providers  Alcohol-Tobacco-Diet     Alcohol drinks/day: 0     Tobacco Status: quit     Tobacco Counseling: to quit use of tobacco products     Cigarette Packs/Day: <0.25  Current Medications (verified): 1)  Toprol Xl 200 Mg Xr24h-Tab (Metoprolol Succinate) .... Two Tabs By Mouth Daily 2)  Novolog Flexpen 100 Unit/ml  Soln (Insulin Aspart) .... Inject 10 Units Prior To 3 Meals Per Day If Pre-Meal Blood Glucose Is Higher Than 120 3)  Lantus Solostar 100 Unit/ml  Soln (Insulin Glargine) .... 50 Units Inj Subcutaneously Qhs 4)  Bd U/f Short Pen Needle 31g X 8 Mm  Misc (Insulin Pen Needle) .... Dispense One Box - Patient Doing 5 Shots Daily.  Dispense 1 Box 5)  Percocet 5-325 Mg Tabs (Oxycodone-Acetaminophen) .[name].. 1 Tab By Mouth Q 6 Hours As Needed Pain. 6)  Lisinopril-Hydrochlorothiazide 20-25 Mg Tabs (Lisinopril-Hydrochlorothiazide) .... (Hold For Now)  One Tab By Mouth Daily 7)  Pravastatin  Sodium 20 Mg Tabs (Pravastatin Sodium) .... One Tab By Mouth Daily 8)  Aspirin 81 Mg Tbec (Aspirin) .... One Tab By Mouth Daily 9)  Prodigy Lancets 28g  Misc (Lancets) .... To Be Used Qac and Hs For Insulin Dosing. 10)  Prodigy Onetouch Ultra Meter .... Use For Glucose Monitoring 11)  Zofran 4 Mg Tabs (Ondansetron Hcl) .... One Tab By Mouth Q4h As Needed For Nausea 12)  Wellbutrin 100 Mg Tabs (Bupropion Hcl) .... One Tab By Mouth Two Times A Day X 3d Then Increase To Three Times A Day 13)  Norvasc 5 Mg Tabs (Amlodipine Besylate) .... Two Tabs By Mouth Daily 14)  Nexium 40 Mg Cpdr (Esomeprazole Magnesium) .... One Tab By Mouth in The Morning and Then Again At Dinnertime 15)  Clotrimazole 1 % Soln (Clotrimazole) .... Apply 2 Drops Two Times A Day Into Both Ears For 14 Days.  Allergies (verified): No Known Drug Allergies  Review of Systems       See HPI  Physical Exam  General:  Well-developed,well-nourished,in no acute distress; alert,appropriate and cooperative throughout examination Head:  Normocephalic and atraumatic without obvious abnormalities.  Eyes:  No corneal or conjunctival inflammation noted. EOMI. Perrl Ears:  Some whitish material in L EAM.  R eam clear.  TMs clear bilaterally.  No mastoid tenderness or discoloration. Nose:  External nasal examination shows no deformity or inflammation. Nasal mucosa are pink and moist without lesions or exudates. Mouth:  Oral mucosa and oropharynx without lesions or exudates.  Neck:  No deformities, masses, or tenderness noted. Lungs:  Normal respiratory effort, chest expands symmetrically. Lungs are clear to auscultation, no crackles or wheezes. Heart:  Normal rate and regular rhythm. S1 and S2 normal without gallop, murmur, click, rub or other extra sounds.   Impression & Recommendations:  Problem # 1:  SORE THROAT (ICD-462) Assessment Unchanged Rapid strep neg. Checking throat GC/Chlam swabs. This may explain his ear pain  (referred). Increasing nexium to 40 mg two times a day. If no better in 2 weeks will refer to ENT for direct visualization.  His updated medication list for this problem includes:    Aspirin 81 Mg Tbec (Aspirin) ..... One tab by mouth daily  Orders: [facility]- Est  Level 4 [id number]) Miscellaneous Lab Charge-[facility] [id number]) Rapid Strep-[facility] [id number])  Problem # 2:  OTITIS EXTERNA, ACUTE (ICD-380.12) Assessment: Unchanged S/p ciprodex course. Whitish material may represent otomycosis, especially in a diabetic. Will treat with clotrimazole topical for 14 days.  Clotrimazole covers common fungi as well as S. Aureus interestingly. RTC 1-2 weeks to reassess.  Orders: [place]- Est  Level 4 ([id number])  Problem # 3:  CHRONIC KIDNEY DISEASE STAGE III (MODERATE) (ICD-585.3) Assessment: Deteriorated Holding ACE for now. Rechecking BMET. RTC 1-2 weeks to recheck again. His baseline has been <1.5.  Orders: [facility]- Est  Level 4 [id number]) Basic Met-[facility] [id number])  Problem # 4:  HYPERTENSION, BENIGN SYSTEMIC (ICD-401.1) Assessment: Deteriorated This may be cause by his ARF. Increased norvasc to 10. Holding ACE/thiazide 2/2 acute on chronic renal dysfnxn. Checking BMET RTC 1-2 weeks to reassess.  His updated medication list for this problem includes:    Toprol Xl 200 Mg Xr24h-tab (Metoprolol succinate) .[name]..[name] Two tabs by mouth daily    Lisinopril-hydrochlorothiazide 20-25 Mg Tabs (Lisinopril-hydrochlorothiazide) ..... (hold for now)  one tab by mouth daily    Norvasc 5 Mg Tabs (Amlodipine besylate) .[name]..[name] Two tabs by mouth daily  Orders: [facility]- Est  Level 4 [id number]) Basic Met-[facility] [id number])  Complete Medication List: 1)  Toprol Xl 200 Mg Xr24h-tab (Metoprolol succinate) .... Two tabs by mouth daily 2)  Novolog Flexpen 100 Unit/ml Soln (Insulin aspart) .... Inject 10 units prior to 3 meals per day if pre-meal blood glucose is higher than 120 3)  Lantus Solostar 100 Unit/ml Soln (Insulin glargine) .... 50  units inj subcutaneously qhs 4)  Bd U/f Short Pen Needle 31g X 8 Mm Misc (Insulin pen needle) .... Dispense one box - patient doing 5 shots daily.  dispense 1 box 5)  Percocet 5-325 Mg Tabs (Oxycodone-acetaminophen) .[name].. 1 tab by mouth q 6 hours as needed pain. 6)  Lisinopril-hydrochlorothiazide 20-25 Mg Tabs (Lisinopril-hydrochlorothiazide) .... (hold for now)  one tab by mouth daily 7)  Pravastatin Sodium 20 Mg Tabs (Pravastatin sodium) .... One tab by mouth daily 8)  Aspirin 81 Mg Tbec (Aspirin) .... One tab by mouth daily 9)  Prodigy Lancets 28g Misc (Lancets) .... To be used qac and hs for insulin dosing. 10)  Prodigy Onetouch Ultra Meter  .... Use for glucose monitoring 11)  Zofran 4 Mg Tabs (Ondansetron hcl) .... One tab by mouth q4h as needed for nausea 12)  Wellbutrin 100 Mg Tabs (Bupropion hcl) .... One  tab by mouth two times a day x 3d then increase to three times a day 13)  Norvasc 5 Mg Tabs (Amlodipine besylate) .... Two tabs by mouth daily 14)  Nexium 40 Mg Cpdr (Esomeprazole magnesium) .... One tab by mouth in the morning and then again at dinnertime 15)  Clotrimazole 1 % Soln (Clotrimazole) .... Apply 2 drops two times a day into both ears for 14 days.  Patient Instructions: 1)  Increase nexium to two times a day 2)  Increase Norvasc to 10mg  (two 5mg  tabs in the morning) 3)  HOLD YOUR LISINOPRIL/HCTZ FOR NOW!!! 4)  Checking bloodwork. 5)  Swabbed throat. 6)  Fungal ear drops. 7)  Come back to see me in a 1-2 weeks, make appt at the front! 8)  -Dr. Darene Lamer. Prescriptions: CLOTRIMAZOLE 1 % SOLN (CLOTRIMAZOLE) Apply 2 drops two times a day into both ears for 14 days.  #14d QS. x 0   Entered and Authorized by:   Aundria Mems MD   Signed by:   Aundria Mems MD on 11/25/2010   Method used:   Print then Give to Patient   RxID:   [id number] PERCOCET 5-325 MG TABS (OXYCODONE-ACETAMINOPHEN) 1 tab by mouth q 6 hours as needed pain.  #60 x 0   Entered and Authorized by:    Aundria Mems MD   Signed by:   Aundria Mems MD on 11/25/2010   Method used:   Print then Give to Patient   RxID:   [id number]    Orders Added: 1)  [facility]- Est  Level 4 [id number] 2)  Basic Met-[facility] [id number] 3)  Miscellaneous Lab Charge-[facility] [id number] 4)  Rapid Strep-[facility] [id number]      Laboratory Results  Date/Time Received: November 25, 2010 9:41 AM  Date/Time Reported: November 25, 2010 10:51 AM   Other Tests  Rapid Strep: negative Comments: ...............test performed by......Marland KitchenBonnie A. [name], MLS (ASCP)cm

## 2011-01-27 NOTE — Progress Notes (Signed)
Summary: Refill request  Phone Note Refill Request Call back at [phone]   Refills Requested: Medication #1:  PERCOCET 5-325 MG TABS 1 tab by mouth q 6 hours as needed pain. pt asking for another MD to write this Rx since Dr. Darene Lamer is  not scheduled to be here, pt is in severe pain, says its due today, did not make the f/u appt bc he was out of town. Would like RN to call him back today to let him know if MD will or will not write it. Samara Snide 11:39 am 12/22/10  Initial call taken by: Eusebio Friendly,  December 21, 2010 3:17 PM    Prescriptions: PERCOCET 5-325 MG TABS (OXYCODONE-ACETAMINOPHEN) 1 tab by mouth q 6 hours as needed pain.  #30 x 0   Entered and Authorized by:   [name] MD   Signed by:   [name] MD on 12/22/2010   Method used:   Print then Give to Patient   RxID:   [id number]  Per his registration information, pt is on probation.  I believe this means he needs to schedule an appointment before we can refill any medicines.  I will forward to Elray Mcgregor to clarify.  Sarah [name] MD  December 22, 2010 12:50 PM  I was informed that probation allows prescription without visit. Review of last note indicates that he was to return for blood pressure check after medication change. I gave him enough Percocet for 2 weeks with understanding tha he should be seen here before further refills.

## 2011-01-27 NOTE — Assessment & Plan Note (Signed)
Summary: F/U/RH   Vital Signs:  Patient profile:   35 year old male Height:      73 inches Weight:      164.2 pounds BMI:     21.74 Temp:     98.5 degrees F oral Pulse rate:   100 / minute BP sitting:   170 / 110  (right arm) Cuff size:   regular  Vitals Entered By: Enid Skeens, CMA (January 06, 2011 9:11 AM) CC: kidney function Is Patient Diabetic? Yes Did you bring your meter with you today? No Pain Assessment Patient in pain? yes       2  Primary Care Provider:  Aundria Mems MD  CC:  kidney function.  History of Present Illness: 35 yo male with uncontroleld DM1 here for fu.  He arrives late and we will cover 2 problems.  Acute on chronic renal insufficiency:  Creatinine was >3 at last visit early december 2011.  He did not hold his ACE after leaving the hospital.  We held the ace and told him to come back in 2 weeks to recheck BMET.  He did not return.  Come back today, claims he has been off the ACE.    HTN:  BP elevated as expected from being off ACE but pulse also elevated.  Pt claims he is taking beta blocker.  Otitis externa:  Bilateral, pt has been tx with ciprodex and clotrimazole topical.  Claims he cannot get drops in ear.  Still with pain.  Bilateral LE pain:  Pt advised we will assess this at a later visit.  Habits & Providers  Alcohol-Tobacco-Diet     Tobacco Status: quit  Current Medications (verified): 1)  Toprol Xl 200 Mg Xr24h-Tab (Metoprolol Succinate) .... Two Tabs By Mouth Daily 2)  Novolog Flexpen 100 Unit/ml  Soln (Insulin Aspart) .... Inject 10 Units Prior To 3 Meals Per Day If Pre-Meal Blood Glucose Is Higher Than 120 3)  Lantus Solostar 100 Unit/ml  Soln (Insulin Glargine) .... 50 Units Inj Subcutaneously Qhs 4)  Bd U/f Short Pen Needle 31g X 8 Mm  Misc (Insulin Pen Needle) .... Dispense One Box - Patient Doing 5 Shots Daily.  Dispense 1 Box 5)  Percocet 5-325 Mg Tabs (Oxycodone-Acetaminophen) .[name].. 1 Tab By Mouth Q 6 Hours As Needed  Pain. 6)  Lisinopril-Hydrochlorothiazide 20-25 Mg Tabs (Lisinopril-Hydrochlorothiazide) .... (Hold For Now)  One Tab By Mouth Daily 7)  Pravastatin Sodium 20 Mg Tabs (Pravastatin Sodium) .... One Tab By Mouth Daily 8)  Aspirin 81 Mg Tbec (Aspirin) .... One Tab By Mouth Daily 9)  Prodigy Lancets 28g  Misc (Lancets) .... To Be Used Qac and Hs For Insulin Dosing. 10)  Prodigy Onetouch Ultra Meter .... Use For Glucose Monitoring 11)  Zofran 4 Mg Tabs (Ondansetron Hcl) .... One Tab By Mouth Q4h As Needed For Nausea 12)  Wellbutrin 100 Mg Tabs (Bupropion Hcl) .... One Tab By Mouth Two Times A Day X 3d Then Increase To Three Times A Day 13)  Norvasc 5 Mg Tabs (Amlodipine Besylate) .... Two Tabs By Mouth Daily 14)  Nexium 40 Mg Cpdr (Esomeprazole Magnesium) .... One Tab By Mouth in The Morning and Then Again At Dinnertime 15)  Clotrimazole 1 % Soln (Clotrimazole) .... Apply 2 Drops Two Times A Day Into Both Ears For 14 Days.  Allergies (verified): No Known Drug Allergies  Review of Systems       See HPI  Physical Exam  General:  Well-developed,well-nourished,in no acute distress;  alert,appropriate and cooperative throughout examination Ears:  Some whitish material in B/l  EAM. TMs clear bilaterally.  No mastoid tenderness or discoloration. Lungs:  Normal respiratory effort, chest expands symmetrically. Lungs are clear to auscultation, no crackles or wheezes. Heart:  Normal rate and regular rhythm. S1 and S2 normal without gallop, murmur, click, rub or other extra sounds.   Impression & Recommendations:  Problem # 1:  OTITIS EXTERNA, ACUTE (ICD-380.12) Assessment Deteriorated Appears fungal however pt non-compliance is an issue.   My nurses will flush his ears and he will continue to use the clotrimazole drops. If no better at next visit can try systemic antifungal. ENT referral will be a last resort. No signs mastoiditis.  Orders: [place]- Est  Level 4 ([id number])  Problem # 2:  CHRONIC  KIDNEY DISEASE STAGE III (MODERATE) (ICD-585.3) Assessment: Deteriorated Pt did not return to have BMET checked. Will check this today. Cont to hold ACE/HCTZ combo. Currently no HD indications.  Orders: [facility]- Est  Level 4 [id number]) Basic Met-[facility] [id number])  Problem # 3:  HYPERTENSION, BENIGN SYSTEMIC (ICD-401.1) Assessment: Deteriorated With relative tachycardia. Though he claims he is taking his Toprol XL 400 daily, his last refill was in august without enough rx'ed to last until today, this proves non-compliance. He was advised to cont to take toprol at the current dose, I will refill this medication.   His updated medication list for this problem includes:    Toprol Xl 200 Mg Xr24h-tab (Metoprolol succinate) .[name]..[name] Two tabs by mouth daily    Lisinopril-hydrochlorothiazide 20-25 Mg Tabs (Lisinopril-hydrochlorothiazide) ..... 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 Refilled #30 percocet. Needs narcotic contract. UDS with opiate panel today.  Orders: [place] Referral (Wound Care) Miscellaneous Lab Charge-[facility] [phone])  Complete Medication List: 1)  Toprol Xl 200 Mg Xr24h-tab (Metoprolol succinate) .... Two tabs by mouth daily 2)  Novolog Flexpen 100 Unit/ml Soln (Insulin aspart) .... Inject 10 units prior to 3 meals per day if pre-meal blood glucose is higher than 120 3)  Lantus Solostar 100 Unit/ml Soln (Insulin glargine) .... 50 units inj subcutaneously qhs 4)  Bd U/f Short Pen Needle 31g X 8 Mm Misc (Insulin pen needle) .... Dispense one box - patient doing 5 shots daily.  dispense 1 box 5)  Percocet 5-325 Mg Tabs (Oxycodone-acetaminophen) .[name].. 1 tab by mouth q 6 hours as needed pain. 6)  Lisinopril-hydrochlorothiazide 20-25 Mg Tabs  (Lisinopril-hydrochlorothiazide) .... (hold for now)  one tab by mouth daily 7)  Pravastatin Sodium 20 Mg Tabs (Pravastatin sodium) .... One tab by mouth daily 8)  Aspirin 81 Mg Tbec (Aspirin) .... One tab by mouth daily 9)  Prodigy Lancets 28g Misc (Lancets) .... To be used qac and hs for insulin dosing. 10)  Prodigy Onetouch Ultra Meter  .... Use for glucose monitoring 11)  Zofran 4 Mg Tabs (Ondansetron hcl) .... One tab by mouth q4h as needed for nausea 12)  Wellbutrin 100 Mg Tabs (Bupropion hcl) .... One tab by mouth two times a day x 3d then increase to three times a day 13)  Norvasc 5 Mg Tabs (Amlodipine besylate) .... Two tabs by mouth daily 14)  Nexium 40 Mg Cpdr (Esomeprazole magnesium) .... One tab by mouth in the morning and then again  at dinnertime 15)  Clotrimazole 1 % Soln (Clotrimazole) .... Apply 2 drops two times a day into both ears for 14 days.  Other Orders: [place] Impaction Removal-[facility] [id number]) Prescriptions: TOPROL XL 200 MG XR24H-TAB (METOPROLOL SUCCINATE) Two tabs by mouth daily  #180 x 0   Entered and Authorized by:   Aundria Mems MD   Signed by:   Aundria Mems MD on 01/06/2011   Method used:   Printed then faxed to ...       [facility] (retail)       [address]       [place], [place]  [address]       Ph: [phone]       Fax: [phone]   RxID:   [id number] PERCOCET 5-325 MG TABS (OXYCODONE-ACETAMINOPHEN) 1 tab by mouth q 6 hours as needed pain.  #30 x 0   Entered and Authorized by:   Aundria Mems MD   Signed by:   Aundria Mems MD on 01/06/2011   Method used:   Print then Give to Patient   RxID:   [id number]    Orders Added: 1)  [facility]- Est  Level 4 [id number] 2)  Basic Met-[facility] [id number] 3)  [place] Impaction Removal-[facility] [id number] 4)  [place] Referral [Wound Care] 5)  Miscellaneous Lab [place] [id number]  Appended Document: F/U/RH Patient expressed displeasure with today's visit;  felt that his stump pain was not adequately addressed.  He removed his leg prosthesis and allowed me to examine the stump, which has two chronic non-suppurative ulcerations with granulation tissue surrounding them (dime-sized each); no fluctuance. I explained to the patient that it is important that he provide feedback when he is unhappy with aspects of care in our practice; also, I addressed the patient's reported use of extremely derogatory language about his physician in the waiting area, which was witnessed by patients and staff.  I let him know that I have spoken with first-hand witnesses of his outburst, that such language and behavior is not acceptable when directed toward patients, staff or clinicians, and that we have a zero-tolerance policy regarding this sort of language/behavior.  I have discussed the care and management plan for this patient extensively with Dr. Dianah Field, and I am in agreement with his plan of care.

## 2011-01-27 NOTE — Assessment & Plan Note (Signed)
Summary: f/u visit/per Dr. Jonn Shingles   Vital Signs:  Patient profile:   35 year old male Weight:      162 pounds Temp:     98.3 degrees F oral Pulse rate:   90 / minute Pulse rhythm:   regular BP sitting:   160 / 111  (right arm) Cuff size:   regular  Vitals Entered By: Audelia Hives CMA (January 21, 2011 10:39 AM) CC: follow-up visit Is Patient Diabetic? Yes Did you bring your meter with you today? No   Primary Care Provider:  Aundria Mems MD  CC:  follow-up visit.  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  CRI:  Last creat in the 4's, no lyte disturbances.    Pain:  Last UDS 2 wks ago with opioid panel negative for everything but THC.  Still in pain and asking for refill on percocet.  Amputation site:  Stump pain on L with skin breakdown over shin.  Has appt with wound care center Feb 1 @ 1pm.    HLD:  Due for recheck.  Other medical problems:  Pt states he didn't take any of his meds today.  Habits & Providers  Alcohol-Tobacco-Diet     Alcohol drinks/day: 0     Tobacco Status: quit     Tobacco Counseling: to quit use of tobacco products     Cigarette Packs/Day: <0.25  Exercise-Depression-Behavior     Have you felt down or hopeless? yes     Have you felt little pleasure in things? yes     Depression Counseling: further diagnostic testing and/or other treatment is indicated     Seat Belt Use: always  Current Medications (verified): 1)  Toprol Xl 200 Mg Xr24h-Tab (Metoprolol Succinate) .... Two Tabs By Mouth Daily 2)  Novolog Flexpen 100 Unit/ml  Soln (Insulin Aspart) .... Inject 10 Units Prior To 3 Meals Per Day If Pre-Meal Blood Glucose Is Higher Than 120 3)  Lantus Solostar 100 Unit/ml  Soln (Insulin Glargine) .... 50 Units Inj Subcutaneously Qhs 4)  Bd U/f Short Pen Needle 31g X 8 Mm  Misc (Insulin Pen Needle) .... Dispense One Box - Patient Doing 5 Shots Daily.  Dispense 1 Box 5)  Percocet 5-325 Mg Tabs (Oxycodone-Acetaminophen) .[name].. 1  Tab By Mouth Q 6 Hours As Needed Pain. 6)  Lisinopril-Hydrochlorothiazide 20-25 Mg Tabs (Lisinopril-Hydrochlorothiazide) .... (Hold For Now)  One Tab By Mouth Daily 7)  Pravastatin Sodium 20 Mg Tabs (Pravastatin Sodium) .... One Tab By Mouth Daily 8)  Aspirin 81 Mg Tbec (Aspirin) .... One Tab By Mouth Daily 9)  Prodigy Lancets 28g  Misc (Lancets) .... To Be Used Qac and Hs For Insulin Dosing. 10)  Prodigy Onetouch Ultra Meter .... Use For Glucose Monitoring 11)  Zofran 4 Mg Tabs (Ondansetron Hcl) .... One Tab By Mouth Q4h As Needed For Nausea 12)  Wellbutrin 100 Mg Tabs (Bupropion Hcl) .... One Tab By Mouth Two Times A Day X 3d Then Increase To Three Times A Day 13)  Norvasc 5 Mg Tabs (Amlodipine Besylate) .... Two Tabs By Mouth Daily 14)  Nexium 40 Mg Cpdr (Esomeprazole Magnesium) .... One Tab By Mouth in The Morning and Then Again At Dinnertime 15)  Clotrimazole 1 % Soln (Clotrimazole) .... Apply 2 Drops Two Times A Day Into Both Ears For 14 Days.  Allergies (verified): No Known Drug Allergies  Social History: Therapist, art Use:  always  Review of Systems       See hPI  Physical Exam  General:  Well-developed,well-nourished,in no acute distress; alert,appropriate and cooperative throughout examination Lungs:  Normal respiratory effort, chest expands symmetrically. Lungs are clear to auscultation, no crackles or wheezes. Heart:  Normal rate and regular rhythm. S1 and S2 normal without gallop, murmur, click, rub or other extra sounds. Extremities:  Lesion on L shin closed with not completely approximated.  Keratin fills unapproximated surface.  No erythema, no drainage, no fluctuance, TTP.   Impression & Recommendations:  Problem # 1:  CHRONIC KIDNEY DISEASE STAGE III (MODERATE) (ICD-585.3) Assessment Deteriorated Checking BMET again today. If no better will need to refer to nephrology for consideration of dialysis. He is non compliant with his HTN and DM medications and hypertensive,  diabetic nephropathy is the likely cause of his renal failure.  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 Rechecking UDS with opioid panel, if neg would only rx enough for 2 weeks and do random UDS checks. Narcotic contract signed today. Will go to wound care center today.  Orders: Miscellaneous Lab Charge-[facility] [id number]) [place]- Est  Level 4 [id number])  Problem # 3:  HYPERTENSION, BENIGN SYSTEMIC (ICD-401.1) Assessment: Deteriorated  Non compliant with meds.  His updated medication list for this problem includes:    Toprol Xl 200 Mg Xr24h-tab (Metoprolol succinate) .[name]..[name] Two tabs by mouth daily    Lisinopril-hydrochlorothiazide 20-25 Mg Tabs (Lisinopril-hydrochlorothiazide) ..... (hold for now)  one tab by mouth daily    Norvasc 5 Mg Tabs (Amlodipine besylate) .[name]..[name] Two tabs by mouth daily  Orders: [place]- Est  Level 4 [id number])  Problem # 4:  HYPERLIPIDEMIA (ICD-272.4) Assessment: Comment Only Rechecking dLDL.  His updated medication list for this problem includes:    Pravastatin Sodium 20 Mg Tabs (Pravastatin sodium) ..... One tab by mouth daily  Orders: Direct LDL-[facility] [id number]) [place]- Est  Level 4 [id number])  Problem # 5:  DIABETES MELLITUS, TYPE I, UNCONTROLLED, WITH COMPLICATIONS (Q000111Q) Assessment: Comment Only Non-compliant with meds/insulin. Again, he becomes well controlled and occasionally hypoglycemic when put in the hospital and placed on home insulin regimen.  His updated medication list for this problem includes:    Novolog Flexpen 100 Unit/ml Soln (Insulin aspart) ..... Inject 10 units prior to 3 meals per day if pre-meal blood glucose is higher than 120    Lantus Solostar 100 Unit/ml Soln (Insulin glargine) .Marland KitchenMarland KitchenMarland KitchenMarland Kitchen 50 units inj subcutaneously qhs    Lisinopril-hydrochlorothiazide 20-25 Mg Tabs  (Lisinopril-hydrochlorothiazide) ..... (hold for now)  one tab by mouth daily    Aspirin 81 Mg Tbec (Aspirin) ..... One tab by mouth daily  Complete Medication List: 1)  Toprol Xl 200 Mg Xr24h-tab (Metoprolol succinate) .... Two tabs by mouth daily 2)  Novolog Flexpen 100 Unit/ml Soln (Insulin aspart) .... Inject 10 units prior to 3 meals per day if pre-meal blood glucose is higher than 120 3)  Lantus Solostar 100 Unit/ml Soln (Insulin glargine) .... 50 units inj subcutaneously qhs 4)  Bd U/f Short Pen Needle 31g X 8 Mm Misc (Insulin pen needle) .... Dispense one box - patient doing 5 shots daily.  dispense 1 box 5)  Percocet 5-325 Mg Tabs (Oxycodone-acetaminophen) .[name].. 1 tab by mouth q 6 hours as needed pain. 6)  Lisinopril-hydrochlorothiazide 20-25 Mg Tabs (Lisinopril-hydrochlorothiazide) .... (hold for now)  one tab by mouth daily 7)  Pravastatin Sodium 20 Mg Tabs (Pravastatin sodium) .Marland KitchenMarland KitchenMarland Kitchen  One tab by mouth daily 8)  Aspirin 81 Mg Tbec (Aspirin) .... One tab by mouth daily 9)  Prodigy Lancets 28g Misc (Lancets) .... To be used qac and hs for insulin dosing. 10)  Prodigy Onetouch Ultra Meter  .... Use for glucose monitoring 11)  Zofran 4 Mg Tabs (Ondansetron hcl) .... One tab by mouth q4h as needed for nausea 12)  Wellbutrin 100 Mg Tabs (Bupropion hcl) .... One tab by mouth two times a day x 3d then increase to three times a day 13)  Norvasc 5 Mg Tabs (Amlodipine besylate) .... Two tabs by mouth daily 14)  Nexium 40 Mg Cpdr (Esomeprazole magnesium) .... One tab by mouth in the morning and then again at dinnertime 15)  Clotrimazole 1 % Soln (Clotrimazole) .... Apply 2 drops two times a day into both ears for 14 days.  Patient Instructions: 1)  Checking another urine drug screen with opioid panel. 2)  Also if kidney function doesnt improve on todays BMET, you will need to start seeing a kidney doctor to discuss the possibility of dialysis. 3)  No refills on percocet today. 4)  -Dr.  Darene Lamer.   Orders Added: 1)  Miscellaneous Lab [place] [[id number]] 2)  Basic Met-[facility] [id number] 3)  Direct LDL-[facility] [id number] 4)  [place]- Est  Level 4 [id number]       Appended Document: f/u visit/per Dr. Jonn Shingles Also discussed pt's outburst at last visit.  Informed him this was unacceptable and he would be released from the practice if this happened again, he was apologetic.

## 2011-01-27 NOTE — Miscellaneous (Signed)
Summary: Unacceptable patient behavior  ---- Converted from flag ---- ---- 01/06/2011 10:58 AM, Aundria Mems MD wrote: This patient arrived late to an appt, was advised would only address 1-2 issues.  After being escorted to the waiting room after treatment, pt became irate, yelling and making a scene in the waiting room with offensive racial remarks towards staff and physician in front of children waiting there.  This patient has proven non-compliance and shown disrespect to staff in the past.  This is not acceptable and this patient should be dismissed. -T ------------------------------  Appended Document: Unacceptable patient behavior Pt yelled in the waiting room that the provider didn't care and was a "Cr*cker and foreign A*s N*gger" in front of children in the waiting room.  Appended Document: Unacceptable patient behavior Dr Lindell Noe talked with me about this and I reviewed you notes.   Certainly this behavior is unacceptable.   Our policy is immediate dismissal for violence or threats of violence. In cases like this the PCP and/or medical director meets with the patient to discuss the situation and to inform them that any further disruptive/disrespectful behavior will result in dismissal.   I will be happy to talk with him with you or you can alone or I will alone whichever you think might be best.  Be happy to talk further about this  [place]: Unacceptable patient behavior The therapeutic relationship between this patient and I has been compromised, negative transference and countertransference make it impossible for me to deliver objective care to him.  As patients who are dissatisfied with their physicians can switch to other residents at their leisure, I would request he be switched to another provider if he is to remain in this practice.

## 2011-01-27 NOTE — Progress Notes (Signed)
----   Converted from flag ---- ---- 01/06/2011 4:36 PM, Dalbert Mayotte MD wrote: Mellissa Kohut,  I was precepting when this patient had an outburst in the lobby after a visit with Marcello Moores.  The patient's outburst was witnessed by several pts (including children), as well as staff Enid Skeens gave a graphic account).  I spoke with the pt and with Marcello Moores; Marcello Moores feels inclined to request that the pt be assigned to someone else, if he is permitted to stay in our practice.  I support Marcello Moores in this (is this so agregious that pt should be dismissed?) THanks,  James ------------------------------

## 2011-02-02 NOTE — Miscellaneous (Signed)
Summary: Pain contract  Pain contract   Imported By: Audie Clear 01/25/2011 15:07:16  _____________________________________________________________________  External Attachment:    Type:   Image     Comment:   External Document

## 2011-02-02 NOTE — Progress Notes (Signed)
Summary: Demanding Narcotics  Phone Note Call from Patient Call back at Home Phone [phone]   Reason for Call: Refill Medication Summary of Call: checking status of Rx for percocet.  Initial call taken by: Samara Snide,  January 24, 2011 10:15 AM  Follow-up for Phone Call        pt upset Rx not here, asking to speak with the supervisor, also sending Dr. Darene Lamer a text page.  Follow-up by: Samara Snide,  January 24, 2011 1:44 PM  Additional Follow-up for Phone Call Additional follow up Details #1::        spoke pt. told him md is waiting for one last lab to come in before he will decide. will write him ultracet or tramadol. pt was very upset & does not want those. wants the narcotic pain med which works for him. told pt that I will monitor for lab results & send to md when they come in. he put his wife on the phone & I told her the same thing. assured her I will call when md has a response Additional Follow-up by: Elige Radon RN,  January 24, 2011 1:48 PM    Additional Follow-up for Phone Call Additional follow up Details #2::    pt calling again, is demanding to speak with supervisor Follow-up by: Samara Snide,  January 24, 2011 3:43 PM  Additional Follow-up for Phone Call Additional follow up Details #3:: Details for Additional Follow-up Action Taken: I told Junie Panning that the test results are not in yet & likely would come in tomorrow  Addendum Mr. Menor is calling back to find out what is the issue with his meds.  Please contact ASAP!!! Would also like to know what test is the provider on for results before dispensing meds. Eusebio Friendly  January 26, 2011 11:39 AM  Additional Follow-up by: Elige Radon RN,  January 24, 2011 4:22 PM    pt calling again, is now asking to speak with our medical director, Dr. Erin Hearing, pt feels like his medical needs are not being met & feels like he is being mistreated b/c of the outburst he had with Dr. Dianah Field. Told pt I would send a  message for Dr. Erin Hearing to call him. Samara Snide  January 24, 2011 4:29 PM   I spoke with the patient, his UDS and opioid panels have been negative 2x in a row, 01/06/11 and 01/21/11.  He has had refills for percocet #30 on 12/23/11 and 01/06/11 (as well as many times prior).  Oxycodone remains in the body for up to a week in acute users and longer in chronic users.  Thus it is unlikely he is taking the percocet.  I let him know that I will no longer be prescribing narcotics to him but there are other analgesics that are an option.  He declines any other form of pain medication.  I offered referral to a pain MD but he declines this and says he is getting a new PCP (entirely different practice). We also discussed his worsening kidney insufficiency, Dr. Hassell Done is willing to see him but just needs a referral, will place this into the chart. Aundria Mems MD  January 26, 2011 1:49 PM

## 2011-03-02 ENCOUNTER — Encounter (HOSPITAL_BASED_OUTPATIENT_CLINIC_OR_DEPARTMENT_OTHER): Payer: Medicare Other | Attending: General Surgery

## 2011-03-02 DIAGNOSIS — E119 Type 2 diabetes mellitus without complications: Secondary | ICD-10-CM | POA: Insufficient documentation

## 2011-03-02 DIAGNOSIS — T8789 Other complications of amputation stump: Secondary | ICD-10-CM | POA: Insufficient documentation

## 2011-03-02 DIAGNOSIS — Y835 Amputation of limb(s) as the cause of abnormal reaction of the patient, or of later complication, without mention of misadventure at the time of the procedure: Secondary | ICD-10-CM | POA: Insufficient documentation

## 2011-03-03 ENCOUNTER — Emergency Department (HOSPITAL_COMMUNITY)
Admission: EM | Admit: 2011-03-03 | Discharge: 2011-03-04 | Disposition: A | Discharge status: Other Acute Inpatient Hospital | Payer: Medicare Other | Attending: Emergency Medicine | Admitting: Emergency Medicine

## 2011-03-03 ENCOUNTER — Inpatient Hospital Stay (INDEPENDENT_AMBULATORY_CARE_PROVIDER_SITE_OTHER)
Admission: RE | Admit: 2011-03-03 | Discharge: 2011-03-03 | Disposition: A | Discharge status: Home / Self Care | Payer: Medicare Other | Source: Ambulatory Visit | Attending: Family Medicine | Admitting: Family Medicine

## 2011-03-03 DIAGNOSIS — I10 Essential (primary) hypertension: Secondary | ICD-10-CM

## 2011-03-03 DIAGNOSIS — N289 Disorder of kidney and ureter, unspecified: Secondary | ICD-10-CM | POA: Insufficient documentation

## 2011-03-03 DIAGNOSIS — E119 Type 2 diabetes mellitus without complications: Secondary | ICD-10-CM | POA: Insufficient documentation

## 2011-03-03 DIAGNOSIS — Z794 Long term (current) use of insulin: Secondary | ICD-10-CM | POA: Insufficient documentation

## 2011-03-03 DIAGNOSIS — E079 Disorder of thyroid, unspecified: Secondary | ICD-10-CM

## 2011-03-03 DIAGNOSIS — H60399 Other infective otitis externa, unspecified ear: Secondary | ICD-10-CM

## 2011-03-03 DIAGNOSIS — Z79899 Other long term (current) drug therapy: Secondary | ICD-10-CM | POA: Insufficient documentation

## 2011-03-03 DIAGNOSIS — H938X9 Other specified disorders of ear, unspecified ear: Secondary | ICD-10-CM | POA: Insufficient documentation

## 2011-03-03 DIAGNOSIS — Z7982 Long term (current) use of aspirin: Secondary | ICD-10-CM | POA: Insufficient documentation

## 2011-03-03 DIAGNOSIS — H9210 Otorrhea, unspecified ear: Secondary | ICD-10-CM | POA: Insufficient documentation

## 2011-03-03 LAB — URINALYSIS, ROUTINE W REFLEX MICROSCOPIC
Ketones, ur: NEGATIVE mg/dL
Leukocytes, UA: NEGATIVE
Nitrite: NEGATIVE
Specific Gravity, Urine: 1.021 (ref 1.005–1.030)
pH: 6.5 (ref 5.0–8.0)

## 2011-03-03 LAB — DIFFERENTIAL
Basophils Absolute: 0.1 10*3/uL (ref 0.0–0.1)
Eosinophils Absolute: 0.3 10*3/uL (ref 0.0–0.7)
Eosinophils Relative: 3 % (ref 0–5)
Lymphocytes Relative: 20 % (ref 12–46)
Lymphs Abs: 1.7 10*3/uL (ref 0.7–4.0)
Monocytes Absolute: 0.6 10*3/uL (ref 0.1–1.0)

## 2011-03-03 LAB — COMPREHENSIVE METABOLIC PANEL
Albumin: 2.6 g/dL — ABNORMAL LOW (ref 3.5–5.2)
BUN: 24 mg/dL — ABNORMAL HIGH (ref 6–23)
Calcium: 8.3 mg/dL — ABNORMAL LOW (ref 8.4–10.5)
Creatinine, Ser: 4.14 mg/dL — ABNORMAL HIGH (ref 0.4–1.5)
GFR calc Af Amer: 20 mL/min — ABNORMAL LOW (ref >60)
Total Protein: 6.1 g/dL (ref 6.0–8.3)

## 2011-03-03 LAB — GLUCOSE, CAPILLARY
Glucose-Capillary: 329 mg/dL — ABNORMAL HIGH (ref 70–99)
Glucose-Capillary: 180 mg/dL — ABNORMAL HIGH (ref 70–99)

## 2011-03-03 LAB — CBC
HCT: 32.7 % — ABNORMAL LOW (ref 39.0–52.0)
MCHC: 32.4 g/dL (ref 30.0–36.0)
MCV: 88.1 fL (ref 78.0–100.0)
Platelets: 302 10*3/uL (ref 150–400)
RDW: 12.6 % (ref 11.5–15.5)
WBC: 8.5 10*3/uL (ref 4.0–10.5)

## 2011-03-03 LAB — URINE MICROSCOPIC-ADD ON

## 2011-03-04 ENCOUNTER — Encounter: Payer: Self-pay | Admitting: Family Medicine

## 2011-03-04 ENCOUNTER — Telehealth: Payer: Self-pay | Admitting: Family Medicine

## 2011-03-04 DIAGNOSIS — I10 Essential (primary) hypertension: Secondary | ICD-10-CM

## 2011-03-04 DIAGNOSIS — E108 Type 1 diabetes mellitus with unspecified complications: Secondary | ICD-10-CM

## 2011-03-04 DIAGNOSIS — R110 Nausea: Secondary | ICD-10-CM

## 2011-03-04 LAB — GLUCOSE, CAPILLARY: Glucose-Capillary: 117 mg/dL — ABNORMAL HIGH (ref 70–99)

## 2011-03-04 MED ORDER — ONDANSETRON HCL 4 MG PO TABS: 4.0000 mg | ORAL_TABLET | Freq: Three times a day (TID) | ORAL | Status: DC | PRN

## 2011-03-04 NOTE — Telephone Encounter (Signed)
Please switch him to a new PCP.   There are previous notes in centricity re: racial slurs directed towards me after I refused to refill his narcotics. I have seen him once after the episode to discuss this and I will not be his PCP as I am unable to deliver objective care.

## 2011-03-04 NOTE — Telephone Encounter (Signed)
ER doctor wanted to make sure that our practice was aware that pt was in ER today and she would like him to have good f/up with our clinic as soon as possible.  Pt seen in ER for hyperglycemia (334 on admission to ED, 180 at time of d/c), worsening renal failure- last Cr 3.6, today 4.14.  BP elevated 197/103 then at time of d/c from ER 180/109.  Pt is to call at 8:30 am tomorrow and request next available appointment to f/up on these issues.  Pt did not want to be seen by admitting team.  Refused admission to ER doctor.

## 2011-03-04 NOTE — Assessment & Plan Note (Addendum)
Pt endorses fasting bg usually 200's- 400's.  Will increase lantus to 55 units daily.  Pt encouraged to eat at regular times.  Pt to continue with sliding scale insulin per home regimen.  Pt has no meal coverage only sliding scale coverage for elevated bg levels.  Will consider sending pt to diabetes education to teach carb counting so pt can carb count and cover meals.  Especially since pt eats at irregular intervals.

## 2011-03-04 NOTE — Assessment & Plan Note (Signed)
At f/up appt will make sure that renal referral is in place and that pt has appt pending.

## 2011-03-04 NOTE — Progress Notes (Signed)
  Subjective:    Patient ID: AUTHOR SLAVEN, male    DOB: 07-Jul-1976, 35 y.o.   MRN: [id number]  HPI Pt is a 35 y.o. Male who came to the ER after multiple episodes of n/v.  Found to have hyperglycemia, hypertension, and increased creatinine.    N/VD: Pt endorses n/v/d on 3/8 from 7:30am to 2:30pm.  N/v/d improved after that but was brought to er for eval of symptoms.  + nausea continued but no further vomiting or diarrhea.  Pt able to drink po fluids and ate a sandwich while in ER.  No bloody vomit. No bloody stools.  No urinary complaints or symptoms.    HTN: On arrival to ER pt had bp of 197/103- pt had not taken bp medication x 2 days. At last clinic evals- bp in similar range.  Recently stopped lisinopril and hctz 2/2 renal insufficiency. Pt given toprol in ER but no norvasc.  Pt remained hypertensive so I was notified for consult.    Pt misses approx 2 doses of meds per week. No blurry vision. No h/a.   Elevated creatinine: Pt creatinine 4.14 in ER.  In clinic--On 8/23-3.08, 12/07/10- 2.09, 01/06/11- 4.41,  01/21/10- 4.44.  It appears pt has chronic renal insufficiency and renal consult has been placed but pt has not yet been scheduled for an appointment.   Hyperglycemia: Elevated in 300's- down to 57 after insulin in er.  Then glucose back to 300's.  Pt states that he misses approx 2 doses of insulin per week.  Uses sliding scale insulin to cover elevations.  No meal coverage.  Pt states that bg usually runs high 100's to 400's.  With occasional lows after sliding scale insulin.  ER physician asked me to admit pt for further workup.  Pt refused admission, so I evaluated  Pt as a consult in ER.       Review of Systems As per above hpi    Objective:   Physical Exam  Constitutional: He is oriented to person, place, and time. He appears well-developed.  HENT:  Mouth/Throat: Oropharynx is clear and moist. No oropharyngeal exudate.       + upper and lower denture.  Eyes:   Pupils mimally reactive- h/o cataract surgery  Neck:       No lymphadenopathy  Cardiovascular: Normal rate, regular rhythm and normal heart sounds.  Exam reveals no gallop and no friction rub.   No murmur heard. Pulmonary/Chest: Effort normal and breath sounds normal. No respiratory distress. He has no wheezes.  Abdominal: Soft. He exhibits no distension. There is no tenderness. There is no rebound.  Musculoskeletal: He exhibits no edema.       Bilateral BKA  Neurological: He is alert and oriented to person, place, and time.  Skin: Skin is warm and dry.  Psychiatric: He has a normal mood and affect. His behavior is normal.          Assessment & Plan:

## 2011-03-04 NOTE — Telephone Encounter (Signed)
Patient is scheduled for Monday with you.  He's refusing to see Marcello Moores.

## 2011-03-04 NOTE — Assessment & Plan Note (Signed)
Added furosemide 20mg  daily to pt med regimen.  Pt to take all bp meds daily and consistently over weekend so that we can recheck on Monday and know if we need to titrate up on these medications.  Pt to take norvasc and toprol.  HCTZ and lisinopril held.

## 2011-03-04 NOTE — Assessment & Plan Note (Signed)
Now improved will send rx for zofran prn.  No further vomiting or diarrhea.  No gap on bmet so I do not think that this is related to hyperglycemia.  Pt tolerating po's well.

## 2011-03-04 NOTE — Telephone Encounter (Signed)
He has been reassigned to [facility].

## 2011-03-07 ENCOUNTER — Ambulatory Visit (INDEPENDENT_AMBULATORY_CARE_PROVIDER_SITE_OTHER): Payer: Medicare Other | Admitting: Family Medicine

## 2011-03-07 ENCOUNTER — Encounter: Payer: Self-pay | Admitting: Family Medicine

## 2011-03-07 VITALS — BP 210/110 | HR 72 | Wt 165.0 lb

## 2011-03-07 DIAGNOSIS — E1065 Type 1 diabetes mellitus with hyperglycemia: Secondary | ICD-10-CM

## 2011-03-07 DIAGNOSIS — K5900 Constipation, unspecified: Secondary | ICD-10-CM

## 2011-03-07 DIAGNOSIS — I10 Essential (primary) hypertension: Secondary | ICD-10-CM

## 2011-03-07 MED ORDER — CLONIDINE HCL 0.1 MG PO TABS: 0.1000 mg | ORAL_TABLET | Freq: Two times a day (BID) | ORAL | Status: DC

## 2011-03-07 NOTE — Patient Instructions (Addendum)
Diabetes: I am going to set up an appointment for you with the diabetes education program to learn to carb count-  I am going to get you a new sliding scale that we can try. New sliding scale: >150- 1 unit 151-200- 2 units 201-250- 3 units 251-300- 5units 301-350-6 units 351-400-  7 units 401-450- 8 units >400- 10units  I want you to try to eat regular meals 3 x day. Keep log book of glucose readings, insulin use and meals.  Constipation: Colace(stool softner) 2 x day Miralax use 3 x day until you have bowel movement.  Drink lots of water.  Kidneys: We need to control your bp and your blood sugar in order to keep your kidneys from getting worse.   I will put in referral- if you do not hear back from [place] in a week give [place] a call to check on this appt.  BP: Continue toprol, norvasc, furosemide, and I am going to add clonidine today.  Return in 2 weeks for follow up.  Please call if any questions or concerns.  At next appointment bring all medications.

## 2011-03-08 ENCOUNTER — Telehealth: Payer: Self-pay | Admitting: *Deleted

## 2011-03-08 ENCOUNTER — Encounter: Payer: Self-pay | Admitting: Family Medicine

## 2011-03-08 DIAGNOSIS — K5900 Constipation, unspecified: Secondary | ICD-10-CM | POA: Insufficient documentation

## 2011-03-08 NOTE — Assessment & Plan Note (Addendum)
Creatinine remains at 4 x 1 month now.  Last note said that referral to renal was made but pt states that he was never notified of appointment.  Will place consult again.  Will also order renal u/s since pt states this has not been done and pt has uncontrolled htn on multiple bp agents.

## 2011-03-08 NOTE — Assessment & Plan Note (Signed)
Will start clonidine bid- pt to return in 2 weeks for f/up.  Pt is to bring all bp medications.

## 2011-03-08 NOTE — Telephone Encounter (Signed)
CALLED PT AND INFORMED OF RENAL US APPT 03-14-11 AT 1PM. NOT TO VOID 1 HR BEFORE APPT. [facility] [address]. HAD PT REPEAT INFO BACK TO ME. Mauricia Area

## 2011-03-08 NOTE — Progress Notes (Signed)
  Subjective:    Patient ID: [name], male    DOB: 1976-10-03, 35 y.o.   MRN: [id number]  HPI Hypertension: Elevated today. 210/110.  Pt states that he is taking toprol and norvasc as directed.  Did not bring in medications to today's visit.  No dizziness. No headaches.  Pt states he "feels fine".  No changes in vision.  Diabetes: Pt brought bg log.  Pt doesn't eat at regular intervals, he has irregular sleep pattern.  He is awake most early mornings. BG rnage from 178-302. Except for one fasting low of 40 (pt was symptomatic).  Pt states his sliding scale insulin right now is 10 units for anything greater than 250.  States he sometimes has hypoglycemia after sliding scale use.  Usually only gives this SS coverage 1 x day regardless of bg levels.  Renal insufficiency: Continued elevated creatinine at ER this week.  I see in last note where plan was for referral to renal but pt states that he was never scheduled.  I will re- referred this patient to renal.   Pt in agreement with this.   Constipation: No bm x 4-5 days per pt.  Now having some left lower abd pain.     Review of Systems    as per above Objective:   Physical Exam  Constitutional: He is oriented to person, place, and time. He appears well-developed and well-nourished.  HENT:  Head: Normocephalic and atraumatic.  Cardiovascular: Normal rate, regular rhythm and normal heart sounds.  Exam reveals no gallop and no friction rub.   No murmur heard. Pulmonary/Chest: Effort normal and breath sounds normal. No respiratory distress. He has no wheezes.  Abdominal: Soft. Bowel sounds are normal. He exhibits no distension. There is no tenderness. There is no rebound and no guarding.  Musculoskeletal:       Bilateral BKA- no edema in upper extremitity.  No lesions or sores on stumps bilateral.  Neurological: He is alert and oriented to person, place, and time.  Skin: Skin is warm and dry.  Psychiatric: He has a normal mood  and affect. His behavior is normal. Judgment and thought content normal.          Assessment & Plan:

## 2011-03-08 NOTE — Assessment & Plan Note (Addendum)
Pt increased lantus to 55 units over weekend.  B/C of 1 fasting low bg will not increase further at this time.  Will give new sliding scale insulin.   Pt to dose with each blood glucose check.   See pt instructions. Will add meal time coverage at next appt based on amount of sliding scale insulin needed.  Pt is to keep blood glucose log, record insulin use, and dietary intake.  Will refer pt to diabetes education for carb counting education.

## 2011-03-08 NOTE — Progress Notes (Signed)
Addended byDorthey Sawyer on: 03/08/2011 02:55 PM   Modules accepted: Orders

## 2011-03-08 NOTE — Assessment & Plan Note (Signed)
Colace and or miralax prn- pt given red flags for return.

## 2011-03-09 LAB — BASIC METABOLIC PANEL
Calcium: 8.6 mg/dL (ref 8.4–10.5)
Creatinine, Ser: 3.61 mg/dL — ABNORMAL HIGH (ref 0.4–1.5)
GFR calc Af Amer: 23 mL/min — ABNORMAL LOW (ref >60)

## 2011-03-09 LAB — GLUCOSE, CAPILLARY
Glucose-Capillary: 108 mg/dL — ABNORMAL HIGH (ref 70–99)
Glucose-Capillary: 185 mg/dL — ABNORMAL HIGH (ref 70–99)
Glucose-Capillary: 188 mg/dL — ABNORMAL HIGH (ref 70–99)
Glucose-Capillary: 189 mg/dL — ABNORMAL HIGH (ref 70–99)
Glucose-Capillary: 213 mg/dL — ABNORMAL HIGH (ref 70–99)
Glucose-Capillary: 243 mg/dL — ABNORMAL HIGH (ref 70–99)
Glucose-Capillary: 252 mg/dL — ABNORMAL HIGH (ref 70–99)
Glucose-Capillary: 282 mg/dL — ABNORMAL HIGH (ref 70–99)
Glucose-Capillary: 348 mg/dL — ABNORMAL HIGH (ref 70–99)
Glucose-Capillary: 373 mg/dL — ABNORMAL HIGH (ref 70–99)
Glucose-Capillary: 46 mg/dL — ABNORMAL LOW (ref 70–99)
Glucose-Capillary: 522 mg/dL — ABNORMAL HIGH (ref 70–99)

## 2011-03-09 LAB — CBC
HCT: 32.9 % — ABNORMAL LOW (ref 39.0–52.0)
HCT: 33.6 % — ABNORMAL LOW (ref 39.0–52.0)
HCT: 39.5 % (ref 39.0–52.0)
Hemoglobin: 10.9 g/dL — ABNORMAL LOW (ref 13.0–17.0)
MCH: 29.2 pg (ref 26.0–34.0)
MCHC: 33.1 g/dL (ref 30.0–36.0)
MCV: 88.2 fL (ref 78.0–100.0)
Platelets: 329 10*3/uL (ref 150–400)
Platelets: 340 10*3/uL (ref 150–400)
RBC: 3.73 MIL/uL — ABNORMAL LOW (ref 4.22–5.81)
RDW: 12.3 % (ref 11.5–15.5)
RDW: 12.4 % (ref 11.5–15.5)
RDW: 12.5 % (ref 11.5–15.5)
RDW: 13.4 % (ref 11.5–15.5)
WBC: 8.2 10*3/uL (ref 4.0–10.5)
WBC: 8.3 10*3/uL (ref 4.0–10.5)
WBC: 8.4 10*3/uL (ref 4.0–10.5)
WBC: 9.4 10*3/uL (ref 4.0–10.5)

## 2011-03-09 LAB — URINE CULTURE: Culture  Setup Time: 201111211426

## 2011-03-09 LAB — POCT I-STAT, CHEM 8
BUN: 28 mg/dL — ABNORMAL HIGH (ref 6–23)
Calcium, Ion: 1.09 mmol/L — ABNORMAL LOW (ref 1.12–1.32)
Chloride: 101 mEq/L (ref 96–112)
Creatinine, Ser: 3.8 mg/dL — ABNORMAL HIGH (ref 0.4–1.5)
Glucose, Bld: 487 mg/dL — ABNORMAL HIGH (ref 70–99)
Potassium: 3.7 mEq/L (ref 3.5–5.1)

## 2011-03-09 LAB — COMPREHENSIVE METABOLIC PANEL
ALT: 9 U/L (ref 0–53)
Albumin: 2.7 g/dL — ABNORMAL LOW (ref 3.5–5.2)
Alkaline Phosphatase: 104 U/L (ref 39–117)
Glucose, Bld: 171 mg/dL — ABNORMAL HIGH (ref 70–99)
Potassium: 3.4 mEq/L — ABNORMAL LOW (ref 3.5–5.1)
Sodium: 140 mEq/L (ref 135–145)
Total Protein: 6.6 g/dL (ref 6.0–8.3)

## 2011-03-09 LAB — DIFFERENTIAL
Basophils Absolute: 0.1 10*3/uL (ref 0.0–0.1)
Basophils Relative: 1 % (ref 0–1)
Lymphocytes Relative: 19 % (ref 12–46)
Monocytes Absolute: 0.5 10*3/uL (ref 0.1–1.0)
Neutro Abs: 5.8 10*3/uL (ref 1.7–7.7)
Neutrophils Relative %: 70 % (ref 43–77)

## 2011-03-09 LAB — HEPATIC FUNCTION PANEL
ALT: 10 U/L (ref 0–53)
AST: 14 U/L (ref 0–37)
Albumin: 3.2 g/dL — ABNORMAL LOW (ref 3.5–5.2)
Bilirubin, Direct: 0.1 mg/dL (ref 0.0–0.3)
Total Protein: 8.2 g/dL (ref 6.0–8.3)

## 2011-03-09 LAB — STREP A DNA PROBE: Group A Strep Probe: NEGATIVE

## 2011-03-09 LAB — URINALYSIS, ROUTINE W REFLEX MICROSCOPIC
Bilirubin Urine: NEGATIVE
Leukocytes, UA: NEGATIVE
Nitrite: NEGATIVE
Specific Gravity, Urine: 1.023 (ref 1.005–1.030)
Urobilinogen, UA: 0.2 mg/dL (ref 0.0–1.0)
pH: 6.5 (ref 5.0–8.0)

## 2011-03-09 LAB — POCT CARDIAC MARKERS
CKMB, poc: <1 ng/mL — ABNORMAL LOW (ref 1.0–8.0)
Troponin i, poc: <0.05 ng/mL (ref 0.00–0.09)

## 2011-03-09 LAB — URINE MICROSCOPIC-ADD ON

## 2011-03-09 LAB — MRSA PCR SCREENING: MRSA by PCR: POSITIVE — AB

## 2011-03-13 LAB — GLUCOSE, CAPILLARY: Glucose-Capillary: 243 mg/dL — ABNORMAL HIGH (ref 70–99)

## 2011-03-14 ENCOUNTER — Telehealth: Payer: Self-pay | Admitting: *Deleted

## 2011-03-14 ENCOUNTER — Other Ambulatory Visit: Payer: Medicare Other

## 2011-03-14 NOTE — Telephone Encounter (Signed)
Called pt and stressed the importance of keeping his upcoming appt with Dr.Patel 03-23-11 at 9:30am. He will keep appointment. Repeated info back to me. Mauricia Area

## 2011-03-21 ENCOUNTER — Ambulatory Visit
Admission: RE | Admit: 2011-03-21 | Discharge: 2011-03-21 | Disposition: A | Discharge status: Home / Self Care | Payer: Medicare Other | Source: Ambulatory Visit | Attending: Family Medicine | Admitting: Family Medicine

## 2011-03-22 ENCOUNTER — Ambulatory Visit (INDEPENDENT_AMBULATORY_CARE_PROVIDER_SITE_OTHER): Payer: Medicare Other | Admitting: Family Medicine

## 2011-03-22 ENCOUNTER — Encounter: Payer: Self-pay | Admitting: Family Medicine

## 2011-03-22 VITALS — BP 230/130 | HR 71 | Wt 175.0 lb

## 2011-03-22 DIAGNOSIS — S88119A Complete traumatic amputation at level between knee and ankle, unspecified lower leg, initial encounter: Secondary | ICD-10-CM

## 2011-03-22 DIAGNOSIS — E108 Type 1 diabetes mellitus with unspecified complications: Secondary | ICD-10-CM

## 2011-03-22 DIAGNOSIS — E1065 Type 1 diabetes mellitus with hyperglycemia: Secondary | ICD-10-CM

## 2011-03-22 DIAGNOSIS — I10 Essential (primary) hypertension: Secondary | ICD-10-CM

## 2011-03-22 MED ORDER — CLONIDINE HCL 0.1 MG PO TABS: 0.2000 mg | ORAL_TABLET | Freq: Two times a day (BID) | ORAL | Status: DC

## 2011-03-22 NOTE — Assessment & Plan Note (Addendum)
Gave ssi coverage scale again, pt lost paper and was trying to go by memory.  Pt has appt with diabetes education to learn carb counting on 03/24/11 at 11:30. Since pt did have some fasting bg low's will hold on increasing lantus.  Pt to focus on using sliding scale with each meal so we can know what his meal coverage needs are.  Pt also doesn't have regular sleep routine.  Pt encouraged to have consistent sleep pattern to help with diabetes control and insulin management.  Pt to do research on home internet about sleep hygiene. -- pt had to leave appt early due to [employer] bus waiting outside office--called pt and reviewed above plan later the same day of appointment.

## 2011-03-22 NOTE — Progress Notes (Signed)
  Subjective:    Patient ID: [name], male    DOB: 28-Jul-1976, 35 y.o.   MRN: [id number]  HPI BP: Pt states that he is compliant with medications- He states that he doesn't miss doses b/c he uses pill box to keep his meds straight.  Not checking bp at home.  States that he will consider purchasing a bp machine for home tracking. No headache. No dizziness.   Diabetes: Some improvement in control- highest bg was in high 200's.  Lowest 48.  Pt tracking bg using log book most days.  Did not use SSI regimen I gave him last time- tried to go by memory.  Gave him another copy- pt is to check bg with each meal and cover with SSI.  Pt is also supposed to check fasting and bedtime blood glucose. Pt has appt with cde to learn carb counting.  Stump pain: Pt reports some swelling of right stump off and on.  And pain of right stump.  No sores. No lesions. No drainage.  Pt not checking stump daily- states that it is hard to see stump.  Kidney disease- appt with renal consult this week.  Social: Pt states that he is moving into his own apartment by himself this upcoming month  Review of Systems As per above    Objective:   Physical Exam  Constitutional: He is oriented to person, place, and time. He appears well-developed and well-nourished.  Cardiovascular: Normal rate and regular rhythm.  Exam reveals no gallop and no friction rub.   No murmur heard. Pulmonary/Chest: Effort normal and breath sounds normal. No respiratory distress. He has no wheezes.  Musculoskeletal:       Bilateral BKA-  No lesions, no redness, no drainage, no swelling.  Neurological: He is alert and oriented to person, place, and time.  Skin: Skin is warm and dry.          Assessment & Plan:

## 2011-03-22 NOTE — Assessment & Plan Note (Addendum)
Pt states he is compliant with bp medications- is using pill box to keep them organized.  Will increased clonidine to 0.2mg  po BID.  Pt to return in 1-2 weeks for follow up.

## 2011-03-22 NOTE — Assessment & Plan Note (Signed)
Pt given instructions on how to do daily bilateral stump checks, and warning signs that pt needs to come in for eval.  Must use mirror to evaluate closely.  Pt also encouraged to see prosthesis company to ensure good fit since pt has off and on swelling.

## 2011-03-22 NOTE — Patient Instructions (Signed)
I am going to increase clonidine to 0.2mg  twice a day. Get a mirror and check right stump daily- look for redness, drainage, opening in skin, or any changes. I will review your Blood glucose levels and we will adjust your insulin over the telephone later today- since the [employer] bus is here waiting for you.

## 2011-03-22 NOTE — Assessment & Plan Note (Signed)
Renal u/s 03/21/11- 1. Renal size normal bilaterally without evidence for obstruction  or other focal lesions. 2. Increased echogenicity of the cortex  bilaterally suggests chronic medical renal disease.  3. Bladder normal.  Appt with [place] [facility] scheduled for: 03/22/10 at 0930

## 2011-03-24 ENCOUNTER — Encounter: Payer: Medicare Other | Attending: Family Medicine | Admitting: Dietician

## 2011-03-24 DIAGNOSIS — E109 Type 1 diabetes mellitus without complications: Secondary | ICD-10-CM | POA: Insufficient documentation

## 2011-03-24 DIAGNOSIS — Z713 Dietary counseling and surveillance: Secondary | ICD-10-CM | POA: Insufficient documentation

## 2011-03-29 LAB — CBC
HCT: 30.1 % — ABNORMAL LOW (ref 39.0–52.0)
HCT: 40.7 % (ref 39.0–52.0)
Hemoglobin: 10.1 g/dL — ABNORMAL LOW (ref 13.0–17.0)
Hemoglobin: 10.3 g/dL — ABNORMAL LOW (ref 13.0–17.0)
Hemoglobin: 10.5 g/dL — ABNORMAL LOW (ref 13.0–17.0)
Hemoglobin: 11.2 g/dL — ABNORMAL LOW (ref 13.0–17.0)
Hemoglobin: 13.7 g/dL (ref 13.0–17.0)
MCHC: 33.6 g/dL (ref 30.0–36.0)
MCHC: 33.7 g/dL (ref 30.0–36.0)
MCHC: 33.8 g/dL (ref 30.0–36.0)
MCHC: 33.8 g/dL (ref 30.0–36.0)
MCHC: 34.1 g/dL (ref 30.0–36.0)
MCV: 92.1 fL (ref 78.0–100.0)
MCV: 92.3 fL (ref 78.0–100.0)
MCV: 92.3 fL (ref 78.0–100.0)
MCV: 93.4 fL (ref 78.0–100.0)
Platelets: 214 10*3/uL (ref 150–400)
Platelets: 225 10*3/uL (ref 150–400)
Platelets: 234 10*3/uL (ref 150–400)
RBC: 3.25 MIL/uL — ABNORMAL LOW (ref 4.22–5.81)
RBC: 3.27 MIL/uL — ABNORMAL LOW (ref 4.22–5.81)
RBC: 3.33 MIL/uL — ABNORMAL LOW (ref 4.22–5.81)
RDW: 12.9 % (ref 11.5–15.5)
RDW: 13.2 % (ref 11.5–15.5)
RDW: 13.2 % (ref 11.5–15.5)
RDW: 13.7 % (ref 11.5–15.5)
WBC: 6.8 10*3/uL (ref 4.0–10.5)
WBC: 7.9 10*3/uL (ref 4.0–10.5)

## 2011-03-29 LAB — GLUCOSE, CAPILLARY
Glucose-Capillary: 122 mg/dL — ABNORMAL HIGH (ref 70–99)
Glucose-Capillary: 127 mg/dL — ABNORMAL HIGH (ref 70–99)
Glucose-Capillary: 167 mg/dL — ABNORMAL HIGH (ref 70–99)
Glucose-Capillary: 172 mg/dL — ABNORMAL HIGH (ref 70–99)
Glucose-Capillary: 177 mg/dL — ABNORMAL HIGH (ref 70–99)
Glucose-Capillary: 188 mg/dL — ABNORMAL HIGH (ref 70–99)
Glucose-Capillary: 211 mg/dL — ABNORMAL HIGH (ref 70–99)
Glucose-Capillary: 218 mg/dL — ABNORMAL HIGH (ref 70–99)
Glucose-Capillary: 266 mg/dL — ABNORMAL HIGH (ref 70–99)
Glucose-Capillary: 317 mg/dL — ABNORMAL HIGH (ref 70–99)
Glucose-Capillary: 392 mg/dL — ABNORMAL HIGH (ref 70–99)
Glucose-Capillary: 412 mg/dL — ABNORMAL HIGH (ref 70–99)
Glucose-Capillary: 54 mg/dL — ABNORMAL LOW (ref 70–99)
Glucose-Capillary: 62 mg/dL — ABNORMAL LOW (ref 70–99)
Glucose-Capillary: 67 mg/dL — ABNORMAL LOW (ref 70–99)
Glucose-Capillary: 70 mg/dL (ref 70–99)
Glucose-Capillary: 79 mg/dL (ref 70–99)
Glucose-Capillary: 96 mg/dL (ref 70–99)

## 2011-03-29 LAB — URINALYSIS, MICROSCOPIC ONLY
Glucose, UA: >1000 mg/dL — AB
Leukocytes, UA: NEGATIVE
Protein, ur: >300 mg/dL — AB
pH: 5.5 (ref 5.0–8.0)

## 2011-03-29 LAB — BASIC METABOLIC PANEL
BUN: 10 mg/dL (ref 6–23)
BUN: 19 mg/dL (ref 6–23)
BUN: 8 mg/dL (ref 6–23)
CO2: 23 mEq/L (ref 19–32)
CO2: 25 mEq/L (ref 19–32)
CO2: 27 mEq/L (ref 19–32)
Calcium: 8.7 mg/dL (ref 8.4–10.5)
Chloride: 106 mEq/L (ref 96–112)
Chloride: 107 mEq/L (ref 96–112)
Chloride: 109 mEq/L (ref 96–112)
Chloride: 112 mEq/L (ref 96–112)
Creatinine, Ser: 1.8 mg/dL — ABNORMAL HIGH (ref 0.4–1.5)
GFR calc Af Amer: 47 mL/min — ABNORMAL LOW (ref >60)
GFR calc Af Amer: 49 mL/min — ABNORMAL LOW (ref >60)
GFR calc non Af Amer: 45 mL/min — ABNORMAL LOW (ref >60)
Glucose, Bld: 128 mg/dL — ABNORMAL HIGH (ref 70–99)
Glucose, Bld: 164 mg/dL — ABNORMAL HIGH (ref 70–99)
Glucose, Bld: 231 mg/dL — ABNORMAL HIGH (ref 70–99)
Glucose, Bld: 276 mg/dL — ABNORMAL HIGH (ref 70–99)
Potassium: 3.3 mEq/L — ABNORMAL LOW (ref 3.5–5.1)
Potassium: 3.4 mEq/L — ABNORMAL LOW (ref 3.5–5.1)
Potassium: 3.4 mEq/L — ABNORMAL LOW (ref 3.5–5.1)
Potassium: 3.7 mEq/L (ref 3.5–5.1)
Potassium: 3.8 mEq/L (ref 3.5–5.1)
Sodium: 135 mEq/L (ref 135–145)
Sodium: 138 mEq/L (ref 135–145)
Sodium: 139 mEq/L (ref 135–145)

## 2011-03-29 LAB — RENAL FUNCTION PANEL
Albumin: 2.7 g/dL — ABNORMAL LOW (ref 3.5–5.2)
Calcium: 8.4 mg/dL (ref 8.4–10.5)
Creatinine, Ser: 2.27 mg/dL — ABNORMAL HIGH (ref 0.4–1.5)
GFR calc Af Amer: 40 mL/min — ABNORMAL LOW (ref >60)
GFR calc non Af Amer: 33 mL/min — ABNORMAL LOW (ref >60)

## 2011-03-29 LAB — DIFFERENTIAL
Basophils Relative: 1 % (ref 0–1)
Lymphocytes Relative: 27 % (ref 12–46)
Lymphs Abs: 1.8 10*3/uL (ref 0.7–4.0)
Monocytes Absolute: 0.5 10*3/uL (ref 0.1–1.0)
Monocytes Relative: 7 % (ref 3–12)
Neutro Abs: 4 10*3/uL (ref 1.7–7.7)
Neutrophils Relative %: 60 % (ref 43–77)

## 2011-03-29 LAB — CARDIAC PANEL(CRET KIN+CKTOT+MB+TROPI)
CK, MB: 0.9 ng/mL (ref 0.3–4.0)
CK, MB: 1 ng/mL (ref 0.3–4.0)
Relative Index: INVALID (ref 0.0–2.5)
Relative Index: INVALID (ref 0.0–2.5)
Total CK: 47 U/L (ref 7–232)
Total CK: 59 U/L (ref 7–232)
Troponin I: 0.01 ng/mL (ref 0.00–0.06)
Troponin I: 0.02 ng/mL (ref 0.00–0.06)
Troponin I: 0.03 ng/mL (ref 0.00–0.06)

## 2011-03-29 LAB — COMPREHENSIVE METABOLIC PANEL
Albumin: 3.4 g/dL — ABNORMAL LOW (ref 3.5–5.2)
BUN: 20 mg/dL (ref 6–23)
Calcium: 9.2 mg/dL (ref 8.4–10.5)
Creatinine, Ser: 2.31 mg/dL — ABNORMAL HIGH (ref 0.4–1.5)
Glucose, Bld: 455 mg/dL — ABNORMAL HIGH (ref 70–99)
Total Protein: 7.2 g/dL (ref 6.0–8.3)

## 2011-03-29 LAB — HEPARIN LEVEL (UNFRACTIONATED): Heparin Unfractionated: 0.15 IU/mL — ABNORMAL LOW (ref 0.30–0.70)

## 2011-03-29 LAB — PROTIME-INR: Prothrombin Time: 12.8 seconds (ref 11.6–15.2)

## 2011-03-29 LAB — RAPID URINE DRUG SCREEN, HOSP PERFORMED: Benzodiazepines: NOT DETECTED

## 2011-03-29 LAB — HEMOGLOBIN A1C: Mean Plasma Glucose: 286 mg/dL

## 2011-03-30 ENCOUNTER — Encounter (HOSPITAL_BASED_OUTPATIENT_CLINIC_OR_DEPARTMENT_OTHER): Payer: Medicare Other | Attending: General Surgery

## 2011-03-30 DIAGNOSIS — Y835 Amputation of limb(s) as the cause of abnormal reaction of the patient, or of later complication, without mention of misadventure at the time of the procedure: Secondary | ICD-10-CM | POA: Insufficient documentation

## 2011-03-30 DIAGNOSIS — E119 Type 2 diabetes mellitus without complications: Secondary | ICD-10-CM | POA: Insufficient documentation

## 2011-03-30 DIAGNOSIS — T8789 Other complications of amputation stump: Secondary | ICD-10-CM | POA: Insufficient documentation

## 2011-04-01 LAB — POCT I-STAT, CHEM 8
BUN: 13 mg/dL (ref 6–23)
Calcium, Ion: 1.07 mmol/L — ABNORMAL LOW (ref 1.12–1.32)
HCT: 42 % (ref 39.0–52.0)
Hemoglobin: 14.3 g/dL (ref 13.0–17.0)
TCO2: 30 mmol/L (ref 0–100)

## 2011-04-01 LAB — GLUCOSE, CAPILLARY
Glucose-Capillary: 151 mg/dL — ABNORMAL HIGH (ref 70–99)
Glucose-Capillary: 217 mg/dL — ABNORMAL HIGH (ref 70–99)
Glucose-Capillary: 217 mg/dL — ABNORMAL HIGH (ref 70–99)
Glucose-Capillary: 247 mg/dL — ABNORMAL HIGH (ref 70–99)
Glucose-Capillary: 289 mg/dL — ABNORMAL HIGH (ref 70–99)
Glucose-Capillary: 304 mg/dL — ABNORMAL HIGH (ref 70–99)
Glucose-Capillary: 328 mg/dL — ABNORMAL HIGH (ref 70–99)
Glucose-Capillary: 453 mg/dL — ABNORMAL HIGH (ref 70–99)
Glucose-Capillary: 85 mg/dL (ref 70–99)

## 2011-04-01 LAB — BASIC METABOLIC PANEL
BUN: 5 mg/dL — ABNORMAL LOW (ref 6–23)
CO2: 25 mEq/L (ref 19–32)
CO2: 29 mEq/L (ref 19–32)
Calcium: 7.9 mg/dL — ABNORMAL LOW (ref 8.4–10.5)
Calcium: 8.2 mg/dL — ABNORMAL LOW (ref 8.4–10.5)
Calcium: 8.3 mg/dL — ABNORMAL LOW (ref 8.4–10.5)
GFR calc Af Amer: >60 mL/min (ref >60)
GFR calc non Af Amer: 51 mL/min — ABNORMAL LOW (ref >60)
GFR calc non Af Amer: >60 mL/min (ref >60)
Glucose, Bld: 234 mg/dL — ABNORMAL HIGH (ref 70–99)
Glucose, Bld: 236 mg/dL — ABNORMAL HIGH (ref 70–99)
Potassium: 3.2 mEq/L — ABNORMAL LOW (ref 3.5–5.1)
Potassium: 3.6 mEq/L (ref 3.5–5.1)
Sodium: 137 mEq/L (ref 135–145)
Sodium: 138 mEq/L (ref 135–145)

## 2011-04-01 LAB — URINALYSIS, ROUTINE W REFLEX MICROSCOPIC
Bilirubin Urine: NEGATIVE
Ketones, ur: 15 mg/dL — AB
Specific Gravity, Urine: 1.03 (ref 1.005–1.030)
pH: 6 (ref 5.0–8.0)

## 2011-04-01 LAB — CBC
HCT: 36.3 % — ABNORMAL LOW (ref 39.0–52.0)
Hemoglobin: 11.9 g/dL — ABNORMAL LOW (ref 13.0–17.0)
MCHC: 32.9 g/dL (ref 30.0–36.0)
Platelets: 260 10*3/uL (ref 150–400)
RDW: 12.4 % (ref 11.5–15.5)
RDW: 12.5 % (ref 11.5–15.5)

## 2011-04-01 LAB — URINE CULTURE
Colony Count: NO GROWTH
Culture: NO GROWTH
Special Requests: NEGATIVE

## 2011-04-01 LAB — URINE MICROSCOPIC-ADD ON

## 2011-04-01 LAB — HIV ANTIBODY (ROUTINE TESTING W REFLEX): HIV: NONREACTIVE

## 2011-04-01 LAB — LIPID PANEL
Cholesterol: 109 mg/dL (ref 0–200)
HDL: 31 mg/dL — ABNORMAL LOW (ref >39)
Triglycerides: 69 mg/dL (ref <150)

## 2011-04-01 LAB — RAPID URINE DRUG SCREEN, HOSP PERFORMED
Barbiturates: NOT DETECTED
Benzodiazepines: NOT DETECTED

## 2011-04-01 LAB — HEMOCCULT GUIAC POC 1CARD (OFFICE): Fecal Occult Bld: NEGATIVE

## 2011-04-01 LAB — KETONES, QUALITATIVE: Acetone, Bld: NEGATIVE

## 2011-04-05 ENCOUNTER — Encounter (HOSPITAL_COMMUNITY): Payer: Medicare Other

## 2011-04-07 LAB — COMPREHENSIVE METABOLIC PANEL
ALT: 15 U/L (ref 0–53)
Alkaline Phosphatase: 159 U/L — ABNORMAL HIGH (ref 39–117)
CO2: 28 mEq/L (ref 19–32)
Chloride: 103 mEq/L (ref 96–112)
Glucose, Bld: 381 mg/dL — ABNORMAL HIGH (ref 70–99)
Potassium: 4 mEq/L (ref 3.5–5.1)
Sodium: 137 mEq/L (ref 135–145)
Total Protein: 6.5 g/dL (ref 6.0–8.3)

## 2011-04-07 LAB — CBC
RBC: 4.22 MIL/uL (ref 4.22–5.81)
WBC: 12 10*3/uL — ABNORMAL HIGH (ref 4.0–10.5)

## 2011-04-07 LAB — URINALYSIS, ROUTINE W REFLEX MICROSCOPIC
Ketones, ur: NEGATIVE mg/dL
Nitrite: NEGATIVE
Protein, ur: >300 mg/dL — AB
pH: 5.5 (ref 5.0–8.0)

## 2011-04-07 LAB — DIFFERENTIAL
Basophils Relative: 0 % (ref 0–1)
Lymphocytes Relative: 13 % (ref 12–46)
Lymphs Abs: 1.5 10*3/uL (ref 0.7–4.0)
Monocytes Relative: 7 % (ref 3–12)
Neutro Abs: 9.4 10*3/uL — ABNORMAL HIGH (ref 1.7–7.7)
Neutrophils Relative %: 79 % — ABNORMAL HIGH (ref 43–77)

## 2011-04-07 LAB — GLUCOSE, CAPILLARY
Glucose-Capillary: 206 mg/dL — ABNORMAL HIGH (ref 70–99)
Glucose-Capillary: 438 mg/dL — ABNORMAL HIGH (ref 70–99)

## 2011-04-07 LAB — POCT I-STAT, CHEM 8
Creatinine, Ser: 1.5 mg/dL (ref 0.4–1.5)
Hemoglobin: 13.9 g/dL (ref 13.0–17.0)
Sodium: 137 mEq/L (ref 135–145)
TCO2: 28 mmol/L (ref 0–100)

## 2011-04-07 LAB — URINE MICROSCOPIC-ADD ON

## 2011-04-07 LAB — URINE CULTURE: Culture: NO GROWTH

## 2011-04-11 ENCOUNTER — Telehealth: Payer: Self-pay | Admitting: *Deleted

## 2011-04-11 NOTE — Telephone Encounter (Signed)
Called and left message with person answering phone to please have patient call me.  Dr. Luberta Mutter requested I contact patient and set up an appointment to come in for BP check.

## 2011-04-12 NOTE — Telephone Encounter (Signed)
Called again and person answering phone is his mother and patient does not have a phone to contact at. Ask her to have patient to call , need to talk with him about an appointment.

## 2011-04-13 ENCOUNTER — Other Ambulatory Visit: Payer: Self-pay | Admitting: Family Medicine

## 2011-04-13 ENCOUNTER — Encounter: Payer: Self-pay | Admitting: Family Medicine

## 2011-04-13 ENCOUNTER — Ambulatory Visit (INDEPENDENT_AMBULATORY_CARE_PROVIDER_SITE_OTHER): Payer: Medicare Other | Admitting: Family Medicine

## 2011-04-13 VITALS — BP 165/114 | HR 66 | Wt 164.5 lb

## 2011-04-13 DIAGNOSIS — E108 Type 1 diabetes mellitus with unspecified complications: Secondary | ICD-10-CM

## 2011-04-13 DIAGNOSIS — S88119A Complete traumatic amputation at level between knee and ankle, unspecified lower leg, initial encounter: Secondary | ICD-10-CM

## 2011-04-13 DIAGNOSIS — T8789 Other complications of amputation stump: Secondary | ICD-10-CM

## 2011-04-13 DIAGNOSIS — I10 Essential (primary) hypertension: Secondary | ICD-10-CM

## 2011-04-13 DIAGNOSIS — M79609 Pain in unspecified limb: Secondary | ICD-10-CM | POA: Insufficient documentation

## 2011-04-13 DIAGNOSIS — E1065 Type 1 diabetes mellitus with hyperglycemia: Secondary | ICD-10-CM

## 2011-04-13 DIAGNOSIS — F339 Major depressive disorder, recurrent, unspecified: Secondary | ICD-10-CM

## 2011-04-13 MED ORDER — CLONIDINE HCL 0.3 MG PO TABS: 0.3000 mg | ORAL_TABLET | Freq: Two times a day (BID) | ORAL | Status: DC

## 2011-04-13 MED ORDER — GLUCAGON (RDNA) 1 MG IJ KIT: PACK | INTRAMUSCULAR | Status: DC

## 2011-04-13 MED ORDER — GABAPENTIN 300 MG PO CAPS: 300.0000 mg | ORAL_CAPSULE | ORAL | Status: DC

## 2011-04-13 MED ORDER — SERTRALINE HCL 50 MG PO TABS: 50.0000 mg | ORAL_TABLET | Freq: Every day | ORAL | Status: DC

## 2011-04-13 NOTE — Assessment & Plan Note (Signed)
Strict blood pressure and blood glucose control is key for this issue.  Encouraged pt to take calcium as directed and to reschedule missed appointment for IV iron.

## 2011-04-13 NOTE — Assessment & Plan Note (Signed)
Started neurotin today will f/up in 2 weeks.

## 2011-04-13 NOTE — Assessment & Plan Note (Addendum)
Improved yet still elevated.  Will increase clonidine 0.3mg  BID. Will continue all other bp medications as per med list.

## 2011-04-13 NOTE — Progress Notes (Signed)
  Subjective:    Patient ID: [name], male    DOB: 01/09/1976, 35 y.o.   MRN: [id number]  HPI  Stump pain: Bilat, sharp pains.  Has had this pain off and on since amputations. Pt tearful states that he can't deal with this pain.  went to [employer] to make sure that the fit is good.  Was told to use more socks/stockings for padding.    Has had this pain off and on since amputations.  No sores or redness or drainage from stumps.  Only callous.  Bp: 160/110, improved compared to previous visits ( last check was 230/130). No h/a. No dizziness.   Depression: Has tried paxil didn't help at all in past.  Currently pt states that wellbutrin isn't helping.  Difficulty sleeping, nighttime awakening, able to concentrate, "I don't want to do anything, I just want to lay around."  Has missed school x 2 weeks.   Decreased appetite,  SI or HI denies   BG: Didn't bring in blood sugar and insulin administration log.  Did have one low where he was confused and unable to get up.  Took glucose tablets that were at bedside but no improvement in symptoms and ems was called.  Pt states that he went unresponsive prior to ems arrival and doesn't know what his blood glucose level was.  He was treated and wasn't admitted to hospital for this episode.  Doesn't have glucagon at home. States that highs have been in 200's.  SH: pt states that he has missed school x 2 weeks b/c of above depression-  Pt tearful during exam. Pt is enrolled at [facility] [facility].   Review of Systems As per above    Objective:   Physical Exam  Constitutional: He is oriented to person, place, and time. He appears well-developed and well-nourished.  Cardiovascular: Normal rate.   Pulmonary/Chest: Effort normal. No respiratory distress.  Musculoskeletal:       Stumps bilateral- No redness. No edema. No ulcers. No drainage.  + callous at base of stump bilateral.  Neurological: He is alert and oriented to person, place, and time.  Skin: Skin is  warm and dry.  Psychiatric:       No SI or HI- see assessment in HPI.  + tearfulness during exam.          Assessment & Plan:

## 2011-04-13 NOTE — Assessment & Plan Note (Signed)
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  Will start neurotin.

## 2011-04-13 NOTE — Consult Note (Signed)
[name], [name]            ACCOUNT NO.:  [id number]  MEDICAL RECORD NO.:  [id number]           PATIENT TYPE:  E  LOCATION:  [facility]                         FACILITY:  [facility]  PHYSICIAN:  Villanueva A. Walker Kehr, M.D.    DATE OF BIRTH:  Jun 02, 1976  DATE OF CONSULTATION:  03/04/2011 DATE OF DISCHARGE:                                CONSULTATION   HISTORY OF PRESENT ILLNESS:  The patient is a 35 year old male who came to the ER after multiple episodes of nausea and vomiting, found to have hyperglycemia, hypertension, increased creatinine.  ER physician asked me to admit the patient for further workup.  The patient refused admission to  evaluated the patient as a consult in ER.  Nausea, vomiting, diarrhea:  The patient endorses nausea, vomiting, diarrhea on March 03, 2011, from 7:30 a.m. to 2:30 p.m., nausea, vomiting, diarrhea improves after that, but was brought to ER for above symptoms, positive nausea continued, but no further vomiting or diarrhea.  The patient able to drink p.o. fluids and a sandwich while in ER.  No bloody vomit, no bloody stools, no urinary complaints or symptoms. Hypertension:  On arrival to ER, the patient had a BP of 197/103.  The patient had not taken BP meds x2 days.  The last clinic evaluation blood pressure in similar range, recently stopped lisinopril and HCTZ secondary to renal insufficiency.  The patient was given Toprol in ER, but no Norvasc.  The patient remained hypertensive and was notified for consult.  The patient misses approximately 2 doses of meds per week.  No blurry vision.  No headache.  Elevated creatinine.  The patient's creatinine 4.14 in ER in clinic on August 23, was 3.08, in December it was 2.09, in January had 2 appointments, it was 4.41 and later 4.44.  It appears the patient has chronic renal insufficiency and renal consult has been placed.  The patient has not yet been scheduled for an appointment.  Hyperglycemia: Elevated in 300s in ER,  down to 57 after insulin in ER with blood glucose then back to 300.  The patient states he misses approximately 2 doses of insulin per week.  He uses sliding scale insulin to cover elevations, no new coverage.  The patient states blood glucose normally runs in high 100s to 400s with occasional load after sliding scale insulin.  REVIEW OF SYSTEMS:  As per above HPI.  PHYSICAL EXAMINATION:  VITAL SIGNS:  Blood pressure 197/103, heart rate 92, respiratory rate 20, temperature 98.4. GENERAL:  The patient is oriented to person, place, and time.  He appears well developed, has bilateral below-the-knee amputations. HEENT:  Oropharynx is clear.  Moist oropharyngeal exudate.  Dentures in upper and lower.  Eyes, pupils are minimally reactive, history of cardiac surgery. NECK:  No lymphadenopathy. CARDIOVASCULAR:  Normal rate and regular rhythm.  Normal heart sounds. Exam reveals no gallops or no friction rub.  No murmur heard. PULMONARY:  Chest effort normal and breath sounds normal and no respiratory distress.  He has no wheezes. ABDOMEN:  Soft, he exhibits no distention.  No tenderness.  No rebound. MUSCULOSKELETAL:  No edema.  Positive bilateral BKA.  NEUROLOGIC:  Alert and oriented to person, place, and time. SKIN:  Warm and dry. PSYCHIATRIC:  Normal mood and affect.  Behavior normal.  ASSESSMENT AND PLAN: 1. Nausea.  Now improved, was given Rx for Zofran as needed.  No     further vomiting or diarrhea.  No gap on BMET, so this is related     to hyperglycemia.  The patient tolerated p.o. as well.  We will     follow the patient at clinic on Monday. 2. Hypertension.  Added furosemide 20 mg daily to the patient's     medical regimen.  The patient to take all blood pressure medicines     daily and consistently over weekend, so we can recheck on Monday     and know if we need to titrate upon these medications.  The patient     to take Norvasc and Toprol, hydrochlorothiazide, and lisinopril      held. 3. Chronic kidney disease stage III.  A followup appointment will make     sure the renal referral is in place, the patient has appointment     pending. 4. Diabetes mellitus type 1, uncontrolled with complications.  The     patient endorses fasting blood glucose, usually 200s to 400s.  We     will increase Lantus to 65 units daily.  The patient encouraged to     eat at regular times.  The patient to continue with sliding scale     insulin per home regimen.  He has no meal coverage, only sliding     scale coverage for elevated blood glucose levels.  We are going to     start sending the patient for diabetes education to teach carb     counting so the patient can carb count and cover meals, especially     since the patient is at irregular intervals.  The patient is to     follow up with me, Dr. Luberta Mutter on Monday for further workup of     these issues, since the patient refuses admission at this time.     Andrew Sawyer, MD   ______________________________ Arty Baumgartner. Walker Kehr, M.D.    DC/MEDQ  D:  03/04/2011  T:  03/04/2011  Job:  [id number]  Electronically Signed by [name]  on 04/12/2011 02:17:51 PM Electronically Signed by [name] M.D. on 04/13/2011 04:27:43 AM

## 2011-04-13 NOTE — Patient Instructions (Signed)
Stop taking wellbutrin and start taking zoloft. Have glucagon kit by beside along with glucose tablets in case of a low.  Use glucose tablets first- if severe low or unresponsive family can give injection.  Ask pharmacist to show you how to use. Take calcium as directed by Dr. Posey Pronto. Reschedule iron administration as directed by dr. Posey Pronto. Return in 2 weeks for blood pressure recheck.  Bring blood sugar logs so we can look into this low blood sugars and figure out how to adjust insulin.   Bring medication as well so I can compare with my list.

## 2011-04-13 NOTE — Assessment & Plan Note (Signed)
A1C 10.0 today from 11.5 in nov 2011.  Pt did not bring log book for me to be able to adjust insulin.  Pt to return with logbook in 2 weeks for f/up appt.  Will make insulin adjustments at that time.  Pt had low but doesn't remember how he took insulin, states that he ate well that night.  Once I can see his log book I will know better how to adjust his insulin.   Gave rx for glucagon and gave basic teaching.  Pharmacy to do detailed teaching.

## 2011-04-13 NOTE — Assessment & Plan Note (Signed)
Start on zoloft today. No si or hi.

## 2011-04-21 ENCOUNTER — Encounter: Payer: Self-pay | Admitting: Family Medicine

## 2011-04-21 ENCOUNTER — Other Ambulatory Visit: Payer: Self-pay | Admitting: Family Medicine

## 2011-04-21 DIAGNOSIS — N184 Chronic kidney disease, stage 4 (severe): Secondary | ICD-10-CM

## 2011-04-21 MED ORDER — TORSEMIDE 20 MG PO TABS: 20.0000 mg | ORAL_TABLET | Freq: Every day | ORAL | Status: DC

## 2011-04-28 ENCOUNTER — Encounter (HOSPITAL_COMMUNITY): Payer: Medicare Other

## 2011-04-29 ENCOUNTER — Encounter: Payer: Self-pay | Admitting: Family Medicine

## 2011-04-29 ENCOUNTER — Ambulatory Visit (INDEPENDENT_AMBULATORY_CARE_PROVIDER_SITE_OTHER): Payer: Medicare Other | Admitting: Family Medicine

## 2011-04-29 DIAGNOSIS — N184 Chronic kidney disease, stage 4 (severe): Secondary | ICD-10-CM

## 2011-04-29 DIAGNOSIS — E1065 Type 1 diabetes mellitus with hyperglycemia: Secondary | ICD-10-CM

## 2011-04-29 DIAGNOSIS — E108 Type 1 diabetes mellitus with unspecified complications: Secondary | ICD-10-CM

## 2011-04-29 DIAGNOSIS — M79609 Pain in unspecified limb: Secondary | ICD-10-CM

## 2011-04-29 DIAGNOSIS — T8789 Other complications of amputation stump: Secondary | ICD-10-CM

## 2011-04-29 DIAGNOSIS — I10 Essential (primary) hypertension: Secondary | ICD-10-CM

## 2011-04-29 DIAGNOSIS — F339 Major depressive disorder, recurrent, unspecified: Secondary | ICD-10-CM

## 2011-04-29 MED ORDER — FUROSEMIDE 40 MG PO TABS: 20.0000 mg | ORAL_TABLET | Freq: Two times a day (BID) | ORAL | Status: DC

## 2011-04-29 MED ORDER — METOPROLOL SUCCINATE ER 200 MG PO TB24: 400.0000 mg | ORAL_TABLET | Freq: Every day | ORAL | Status: DC

## 2011-04-29 NOTE — Assessment & Plan Note (Signed)
Pt requesting narcotic for treatment of stump pain.  I told pt that i do not prescribe narcotics for chronic pain and offered to increase neurotin to a more effective dose since he is tolerating side effects well.  Pt refuses increase in nuerotin dose stating that it isn't going to help him.  He states that he will just by narcotics off of the street.  I encouraged him not to do so.  I told him to call me if he decides that he would like me to send in an RX to the pharmacy for a increased dose of neurotin.  Pt appeared upset that I would not prescribe him a narcotic.

## 2011-04-29 NOTE — Assessment & Plan Note (Signed)
Pt scheduled for kidney classes and appt with vascular surgeon for shunt placement.  Renal following.

## 2011-04-29 NOTE — Assessment & Plan Note (Signed)
Improved on zoloft. Will continue at this dosage.

## 2011-04-29 NOTE — Patient Instructions (Signed)
Great job on your blood pressure control!  Your blood pressure looks great today! 119/75. Also great job on keeping your diabetes log book - I would like to see the log book so I can know how to adjust your insulin.  Please bring this with you to your next appointment.  Keep up the hard work on the carb counting. I am glad to hear that the zoloft is helping you feel better.  Next month we can discuss if we need to increase the dose further. For your stump pain:  I do not think that narcotics are the best way to treat this pain- 1) they have a lot of side effects 2) they only work for a short amount of time and then they will no longer work and your pain will return.  Neurotin is the better medication for this pain.  I really believe it will help if we increase the dose.  Please let me know if you would like me to increase the dose to a level that will better help your pain. Return in 1 month for follow up on your diabetes.---please bring log book.

## 2011-04-29 NOTE — Assessment & Plan Note (Signed)
Pt did not bring log book for me to assess insulin usage and blood glucose levels.  Pt to bring log book to next appointment.

## 2011-04-29 NOTE — Progress Notes (Signed)
  Subjective:    Patient ID: [name], male    DOB: [birth year], 35 y.o.   MRN: [id number]  HPI BP:  greatly improved with current regimen. Now wnl.  On review of pt medications I found that pt is only taking norvasc 5mg . Otherwise pt is taking bp medication as directed.  Will keep norvasc at 5mg  daily since bp is currently wnl.  Depression: No SI or HI.  Feeling more socialable to get up earlier in the morning. Not having nighttime awakenings.  Good interest.  Able to concentrate. No longer laying around.  Doesn't even feel like being in the house.   Pain in stumps: Gabapentin - taking as directed. No pain relief.  No sleepiness.  Pain 9/10 on pain scale.  Has had prosthetic adjusted but no had any relief.  Has had this pain on and off x years since amputations. No sores. No drainage.  Checking stumps daily. Pt requesting narcotics  BG: Going to 5/7- kidney class to prepare to go on to dialysis.  Has an appointment to see a vascular surgeon-- this month to discuss site for dialysis.  Pt states, per renal md,  that no dialysis now but preparing just in case.  Had 2 lows in past 2 weeks-  52 and 42-- felt symptomatic- treated with orange juice and candy.  Highest level is 320.  Still keeping log book.  Writes in time, diet log, how much insulin, and glucose level, also writing down amount of carb.  Did not bring log book to appt today so I am unable to see insulin usage or levels in order to adjust insulin.   SH: Lives by self.  Has 1 child- she is 18y.o.  Sometimes takes care of cousin's kids-- ages 83, 28, 16.    Review of Systems     Objective:   Physical Exam  Constitutional: He is oriented to person, place, and time. He appears well-developed and well-nourished.  Cardiovascular: Normal rate.   Pulmonary/Chest: Effort normal. No respiratory distress.  Abdominal: Soft. He exhibits no distension.  Musculoskeletal: He exhibits no edema.       Stump exam: + callous on right  stump, no callous on left.  No lesions. No drainage.   Neurological: He is alert and oriented to person, place, and time.  Skin: Skin is warm. No rash noted.  Psychiatric: He has a normal mood and affect. His behavior is normal. Judgment and thought content normal.          Assessment & Plan:

## 2011-04-29 NOTE — Assessment & Plan Note (Signed)
bp 119/75- taking all meds as directed except for norvasc- pt only taking 5mg .  Will change on med list since pt bp currently under control with this regimen.

## 2011-05-05 ENCOUNTER — Encounter: Payer: Self-pay | Admitting: Family Medicine

## 2011-05-05 ENCOUNTER — Ambulatory Visit: Payer: Medicare Other | Admitting: Vascular Surgery

## 2011-05-10 NOTE — Discharge Summary (Signed)
NAMEAMATO, PIEHL NO.:  [id number]   MEDICAL RECORD NO.:  [id number]          PATIENT TYPE:  INP   LOCATION:  5150                         FACILITY:  [facility]   PHYSICIAN:  Talbert Cage, M.D.DATE OF BIRTH:  1976-04-01   DATE OF ADMISSION:  01/31/2008  DATE OF DISCHARGE:  02/02/2008                               DISCHARGE SUMMARY   DISCHARGE DIAGNOSES:  1. Diabetes type 1.  2. Hypertension.  3. Hyperlipidemia.  4. Chronic osteomyelitis of left lower extremity.   DISCHARGE MEDICATIONS:  1. Benazepril 20 mg daily.  2. Lantus 40 units each evening.  3. NovoLog 6 units with meals, sliding scale as directed.  4. Toprol XL 100 mg daily.  5. Caduet 10/40 daily.  6. Aspirin 81 mg daily.  7. Carafate 10 ml 3 times daily 1 hour before food for 1 week for      heartburn.  8. Keflex 500 mg twice daily for 10 days for cellulitis.   PROCEDURES AND STUDIES:  A radiography performed.   HOSPITAL COURSE:  1. Hyperglycemia, insulin-dependent diabetes:  Please see H and P for      additional detail.  Briefly the patient was found to be      hyperglycemic in clinic with weakness, dehydration, and decreased      urinary output. He also noted diarrhea times 3 and emesis for about      a week.  The patient was admitted, placed on insulin, IV fluids,      and monitored.  The patient had adequate glucose response to      insulin and intravenous rehydration.  He had intermittent loose      stools throughout the stay but was able to hydrate himself      adequately by mouth to maintain euvolemia.  The patient was      initially put on his Lantus 45 units subcutaneous at every bedtime      regimen but was found to be hypoglycemic the morning of discharge      and his regimen was decreased to 40 units at every bedtime.  His      daytime sliding scale was left intact.  The patient was discharged      home with stable blood sugars and instructions for close followup      at  family practice center.  2. Osteomyelitis:  The patient was noted to have a left foot infected      to the bone with a large un healing ulcer.  This is a longstanding      chronic osteomyelitis for which he has been seeing both a Psychologist, sport and exercise      and podiatrist for some time.  At time of discharge he was      continued on cephalexin with instructions to attend his appointment      on Tuesday, February 10 with his surgeon for BKA on the left.  At      time of discharge, the patient was stable and afebrile.  His left      lower extremity was bandaged with gauze and ACE wraps and the  bandages were clean, dry, and intact.  He had no signs of systemic      inflammatory response.  3. Diarrhea:  The patient was noted to have intermittent bouts of      loose stools during his hospital stay.  These were mild and self      limited.  He had a slight exacerbation of this following initiation      of cephalexin. The patient was observed to have adequate p.o.      intake of both liquids and solids.  Was transferring from bed to      bathroom without difficulty.  The patient was advised to continue      Keflex until after he had seen his surgeon and he was given      instructions to use loperamide as directed for symptomatic relief.      In addition, in conjunction with his gastroenteritis he reported      some heart burn symptoms.  He was placed on Protonix b.i.d. as an      outpatient with minimal relief and given Carafate p.o. t.i.d. at      discharge to bridge him until his followup appointment at the      family practice center.  At 24 hours prior to discharge he was      reported to have consumed greater than 50 percent of all of his      meals.   FOLLOWUP ISSUES:  The patient reported running out of Lantus insulin.  However, his peripheral vascular disease and severe vasculopathy, hints  at a rather poor compliance overall.  The patient should have close  followup in the outpatient setting.   With impending surgery for his  osteomyelitis he was also going to be subject to additional systemic  stressors that could push him into hyperglycemia.  This should be  anticipated and frequent followup should be scheduled in outpatient  setting.      Sarita Bottom, M.D.  Electronically Signed      Talbert Cage, M.D.  Electronically Signed    JP/MEDQ  D:  02/02/2008  T:  02/04/2008  Job:  [id number]

## 2011-05-10 NOTE — Discharge Summary (Signed)
[name], SINAGRA NO.:  [id number]   MEDICAL RECORD NO.:  [id number]          PATIENT TYPE:  INP   LOCATION:  [id number]                         FACILITY:  [facility]   PHYSICIAN:  Blane Ohara McDiarmid, M.D.DATE OF BIRTH:  11/23/1976   DATE OF ADMISSION:  01/31/2008  DATE OF DISCHARGE:  02/04/2008                               DISCHARGE SUMMARY   PRIMARY CARE PHYSICIAN:  Kasandra Knudsen, M.D. at [facility].   PRIMARY ORTHOPEDIST:  Dr. Sheran Lawless in [place], [place].   CONSULTATIONS:  Dr. Carlean Purl of GI was consulted.   DISCHARGE DIAGNOSES:  1. Type 1 diabetes, poorly controlled.  2. Status post right below knee amputation.  3. Chronic osteomyelitis of the left foot followed by Dr. Sheran Lawless in      [facility].  4. Candidal esophagitis.  5. Gastroesophageal reflux disease.   DISCHARGE MEDICATIONS:  1. Carafate 1 g/10 ml.  Patient to take 10 ml three times daily one      hour before meals.  2. Diflucan 100 mg daily for six days to complete a 7-day course.  3. Keflex 500 mg b.i.d. for seven days.  4. Benazepril 20 mg daily as previously prescribed.  5. Toprol XL 100 mg daily as previously prescribed.  6. Caduet 10/40 daily as previously prescribed.  7. Aspirin 81 mg daily.  8. Lantus 40 units at night.  9. NovoLog insulin 10 units with breakfast and dinner.  10.Protonix 40 mg daily.   BRIEF HISTORY AND PHYSICAL:  [name] is a 35 year old male with  poorly controlled diabetes who presented with a 1-week history of  weakness, dehydration, and increased urinary output.  He had also had  diarrhea x3 and emesis every other day for the week prior to admission.  He states he felt generally weak but he denied chest pain, shortness of  breath, headache, abdominal pain, hematemesis, or focal weakness.  He  stated he had been checking his CBGs at home and they had been in excess  of 400 all week.  The best CBG he had obtained was 325.  Now  his left  foot is chronically infected to the bone with a large, unhealing ulcer.  The patient was supposed to see his orthopedist on the day of admission  for consultation regarding a BKA.  He has already had a BKA of his right  lower extremity and amputation of his left transmetatarsals.  Of note  the patient stated that he did go to [place], [place]. for the  inauguration on January 20, where he lost his Lantus and some of his  insulin at that time.  He spent three days in [place] without his Lantus or  insulin and has only been taking some Lantus since he returned from his  trip to the inauguration.  Prior to admission he was to be taking Lantus  45 units in the evening and was supposed to be taking sliding scale  insulin according to his blood sugar checks.   HOSPITAL COURSE:  1. Regarding his diabetes and his hyperglycemia the patient was not in  DKA; however, he was very hyperglycemic.  He had dehydrated.  He      was placed on fluids and restarted on his home insulin regimen.      His hyperglycemia did correct nicely overnight.  He had no      electrolyte disturbances, anion gap, or acidosis.  The patient was      taken off fluids; however, he was not eating well.  On further      questioning he had not been eating well for quite some time and      actually endorses a 40-pound weight loss since March confirmed by      our office records on centricity.  He described painful swallowing,      leading him to have decreased p.o. intake; therefore, GI was      consulted for an EGD.  Dr. Carlean Purl did see Mr. Delacy and did take      him for an EGD on Sunday, February 8.  The EGD did show esophagitis      with excessive exudate.  Scrapings were taken, the results of which      did show budding yeast forms consistent with Candida.  However, the      numbers seen on this biopsy were not consistent with the amount of      exudate seen in his esophagus and it was also stated that this       could be some changes also due to gastroesophageal reflux disease.      Also incidentally on the EGD, there was some retained liquid seen      in the stomach suggesting possible gastroparesis and this should be      kept in mind in the future should the patient continue have      symptoms consistent with gastroparesis.   The patient was seen by a diabetes care coordinator who did discover  that the patient had lost his Lantus OptiClik Pen and so was unable to  use his Lantus cartridges that he had at home.  Our clinic does have  Lantus OptiClik Pens available and the pharmacist will be providing him  with this pen prior to discharge today.  He was also seen by our PharmD.  And pharmacy student today in regards to an appropriate insulin regimen  for him at discharge.  He had not been taking his insulin as prescribed  previously.  He is beginning to see the necessity of glucose control as  he is now looking at having his only remaining leg amputated below the  knee.  He does agree to also take two mealtime shots a day in addition  to his night time Lantus.  He states he is out and about through the  middle of the day and giving him a shot at lunch would be very difficult  to do; therefore he states that he will begin giving himself mealtime  insulin at breakfast and dinner.  We will start with 10  units of  NovoLog with breakfast and dinner in addition to his 40 units of Lantus  at night and he will follow up both with our pharmacist and with his  primary care physician to ensure that this regimen is working for him  and they will adjust it as needed.   2..  Regarding this esophagitis and difficulty swallowing as well as  weight loss, it does appear that he does have some degree of Candidal  esophagitis.  He was started on  Diflucan 100 mg p.o. to complete a 7-day  course.  We believe that it is most likely secondary to  immunosuppression of his most chronic diabetes.  His most recent   hemoglobin A1c is 15.2.  He also has a chronically infected foot.  It is  not unreasonable to believe that Mr. Arredondo is chronically  immunosuppressed from these chronic diseases; however, given his weight  loss which could be due to decreased p.o. intake from esophagitis, the  possibility of HIV should also be kept in mind.  The patient does have  multiple tattoos; however, this issue was not addressed during this  hospital stay.  If the patient's symptoms, eating, and weight loss do  not improve with treatment of the esophagitis and with Carafate and  Protonix, perhaps further workup should be pursued by his outpatient  physician.   1. Regarding his chronic osteomyelitis of his left lower extremity, he      has been seeing Dr. Sheran Lawless, his orthopedist, in [facility] as an      outpatient.  As stated previously he did have an appointment with      him on his day of admission which he missed due to being admitted      into the hospital.  He states he does have an appointment to go      back and see him tomorrow on February 10.  They have been heading      towards amputation of that foot for quite some time and this      appointment is to discuss appropriate timing for this intervention.      He was strongly encouraged to keep this appointment, as his sugars      are going to continue to be difficult to control as long as he has      this chronic smoldering infection.   DISPOSITION:  The patient will be discharged to home.   CONDITION ON DISCHARGE:  Stable.   FOLLOWUP:  The patient does have an appointment with Alinda Dooms,  PharmD., at [facility] on February 26 at 9:45 in  the morning.  He also has appointment with Dr. Genene Churn, his primary care  physician, at the [facility], on March 11, at 2 p.m. in the  afternoon, and the patient is also to follow up with Dr. Sheran Lawless, his  orthopedist, tomorrow as previously scheduled.  Follow-up is urged for   Mr. Stahlnecker, particularly regarding his visit to the [facility].  He is to follow with our pharmacist as stated above  who will look at his CBGs and his sugars and talk to him about his  insulin regimen and adjust as needed.  For his appointment with his  primary care physician it was noted on this admission that Mr. Selbe  does have a long-standing chronic anemia with a hemoglobin of  approximately 9 which is quite low for a young male.  This could very  well be anemia of chronic disease.  He has had this anemia for over a  year.  He has no obvious sources of bleeding.  It was not felt that it  was needed to be worked up urgently during this hospitalization and  would be better followed up as an outpatient.      Orland Mustard, MD  Electronically Signed      Blane Ohara McDiarmid, M.D.  Electronically Signed    LM/MEDQ  D:  02/04/2008  T:  02/05/2008  Job:  [id number]   cc:   Gatha Mayer, MD,FACG  Dial Dekarlos, DPM  Kasandra Knudsen, M.D.

## 2011-05-10 NOTE — Consult Note (Signed)
[name], TAI NO.:  [id number]   MEDICAL RECORD NO.:  [id number]          PATIENT TYPE:  INP   LOCATION:  [id number]                         FACILITY:  [facility]   PHYSICIAN:  Gatha Mayer, MD,FACGDATE OF BIRTH:  03/26/1976   DATE OF CONSULTATION:  02/02/2008  DATE OF DISCHARGE:                                 CONSULTATION   REQUESTING PHYSICIAN:  Family Medicine teaching service.  I was called  by Dr. Caffie Pinto.  The patient is admitted to Dr. Erin Hearing.  His primary care  physician is Dr. Genene Churn.   REASON FOR CONSULTATION:  Odynophagia and dysphagia.   ASSESSMENT:  One to two week history of worsening odynophagia and  dysphagia in a man with type 1 diabetes mellitus.  Etiologies include  infection such as Candida, herpes or even CMV.  Severe reflux symptoms  as well.   PLAN:  Upper GI endoscopy tomorrow to investigate and determine the  cause.  He understands the risks, benefits and indications and agrees to  proceed.   HISTORY:  This 35 year old African American man was admitted with a one  week history of weakness, dehydration and increased urinary output on  January 31, 2008.  He was having some diarrhea which has resolved.  He  also had some nausea, vomiting and hyperglycemia.  He has an underlying  left foot infection and osteomyelitis.  He spent some time in late  January after going to the [place] for inauguration without  his Lantus and regular insulin.  He was only on Lantus since return from  that.  There are no fever or chills.  He denies any pain other than his  pain with swallowing.  He has had wound care to the left foot.  There is  a possible pending below-the-knee amputation for that.  He wears  eyeglasses, but is having no acute visual difficulty.  The remainder of  the review of systems is negative.   Note, at baseline he has some occasional heartburn, but takes Protonix  with good control he tells me.  There is no dysphagia or  odynophagia  prior to admission, i.e. prior to this recent illness for the past week  or so I should say.  There has been no bleeding.  Solid foods are more  painful than liquids.   PAST MEDICAL HISTORY:  1. Diabetes mellitus type 1 age 58 poorly controlled.  2. Osteomyelitis with right below-the-knee amputation for that and      left transmetatarsal amputation four years ago for same, ongoing in      the left lower extremity.  3. Hypertension.  4. Dyslipidemia.  5. He may have had an EGD in the past, he is not sure.   MEDICATIONS:  1. Amlodipine 10 mg daily.  2. Aspirin 81 mg daily.  3. Lipitor 40 mg each evening.  4. Benazepril 20 mg daily.  5. Cephalexin 500 mg p.o. twice daily.  6. Lantus and NovoLog insulin.  7. Loperamide p.r.n.  8. Sucralfate 1 gram 4 times daily.   ALLERGIES:  There are no known drug allergies.   FAMILY HISTORY:  Diabetes, hypertension, cerebrovascular accident in  maternal grandmother.  No known family history of type 1 diabetes.   SOCIAL HISTORY:  He is unemployed, lives with his wife and three  children, one biologic two stepchildren.  Smokes rare cigarettes.  No  alcohol or drug use.   PHYSICAL EXAMINATION:  GENERAL APPEARANCE:  A well-developed, well-  nourished black man in no acute distress.  VITAL SIGNS:  Pulse 76 and regular, blood pressure 150/84, temperature  is 97.7, respirations are 18.  HEENT:  Eyes anicteric.  The neck is supple.  No mass.  Mouth shows  dentures, otherwise free of lesions.  There is no thrush or ulceration.  LYMPH NODES:  Not present in the cervical and supraclavicular regions to  my palpation.  LUNGS:  Clear.  BACK:  No CVA tenderness.  HEART:  S1, S2.  No rubs or gallops.  ABDOMEN:  Muscular, soft and thin.  No organomegaly or mass.  SKIN:  Numerous tattoos.  No other rash.  Left lower extremity has a  bandage around his remaining foot.  There is a right BKA with an intact  stump.  There is no edema noted.   NEUROLOGIC:  He is alert and x3.  Cranial nerves II-XII grossly intact.   LABORATORY DATA:  B-MET with a potassium of 3.1 today, sodium 132,  glucose 31 early this morning - this has come up into the 100s,  creatinine 1.19, BUN 4.  Hemoglobin 10.2, platelet count 641, white  count 15,000.  Hemoglobin A1c 15.8%.  Anemia panel showing iron  saturation 8% with a ferritin of 354, a low TIBC and a low iron  consistent with chronic disease anemia.  Folate and B12 levels are  normal.   X-RAY DATA:  None this admission.   I appreciate the opportunity to care for this patient.  Note, I have  seen, interviewed and performed a physical on the patient as well as  reviewed the chart for old records and the computer records.   Additional history is that he complains of some early satiety symptoms.  I think gastroparesis is quite possible.  He did have an upper GI series  that was normal in January 2008.  There is a listing of the  gastroparesis diagnosis there though he has not had a gastric emptying  scan.      Gatha Mayer, MD,FACG  Electronically Signed     CEG/MEDQ  D:  02/02/2008  T:  02/04/2008  Job:  [id number]   cc:   Kasandra Knudsen, M.D.

## 2011-05-10 NOTE — H&P (Signed)
[name], [name] NO.:  [id number]   MEDICAL RECORD NO.:  [id number]          PATIENT TYPE:  INP   LOCATION:  5150                         FACILITY:  [facility]   PHYSICIAN:  [name], M.D.DATE OF BIRTH:  May 11, 1976   DATE OF ADMISSION:  01/31/2008  DATE OF DISCHARGE:                              HISTORY & PHYSICAL   CHIEF COMPLAINT:  1. Hyperglycemia  2. Dehydration.  3. Weakness.   PRIMARY CARE PHYSICIAN:  Dr. Kasandra Caldwell at the [facility].   HISTORY OF PRESENT ILLNESS:  This is a 35 year old male with poorly-  controlled diabetes mellitus, type 1, who is presenting with a 1-week  history of weakness, dehydration, and increased urinary output.  He has  also had diarrhea x3 and emesis every other day for the past week.  He  feels generally week but denies chest pain, shortness of breath,  headache, abdominal pain, hematemesis, and focal weakness.  He has been  checking his CBGs at home and they have been in excess of 400 all week,  the best CBG he has been able to obtain has been 325 on 1 occasion.  His  left foot is infected to the bone with a large unhealing ulcer, and the  patient was supposed to go to his podiatrist in [facility] today to  discuss when to go for a BKA.  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  The patient went to [place] inaug[place]  on January 20 and last his Lantus and insulin at that time.  He spent 3  days there without his Lantus or insulin and has been taking only Lantus  since he returned.  He takes Lantus 45 units in the evening and is  supposed to be checking his blood sugars and taking sliding scale  insulin in addition.   PAST MEDICAL HISTORY:  1. Diabetes mellitus, type 1, diagnosed at age 60 with poor control.  2. Osteomyelitis.  3. Hypertension.  4. Hyperlipidemia.  5. He has a right BKA and left transmetatarsal amputation about 4      years ago with  multiple admissions for hyperglycemia.   FAMILY HISTORY:  Diabetes, hypertension, and cerebrovascular accident in  his maternal grandmother.  No known family history of type 1 diabetes.   SOCIAL HISTORY:  The patient is unemployed and lives with his wife and 3  children in [place].  He smokes 1 cigarette on occasion and denies  alcohol and drug use.   REVIEW OF SYSTEMS:  Per HPI.   MEDICATIONS:  1. Lantus 45 units subcu every night.  2. Benazepril 20 mg p.o. daily.  3. Toprol XL 100 mg p.o. daily.  4. Caduet 10/40 one tab p.o. daily.  5. Aspirin, not taking.  6. Sliding scale insulin, not taking.   ALLERGIES:  NO KNOWN DRUG ALLERGIES.   PHYSICAL EXAM:  Temperature 98.9.  Heart rate 103.  Blood pressure  134/89.  Weight 145.2 pounds.  GENERAL:  Alert and oriented but ill-appearing male in no acute  distress.  HEENT:  Sunken eyes with  dry mucous membranes.  CARDIO:  Tachycardic with a regular rhythm and 2/6 systolic murmur, 2+  radial pulses bilateral.  PULMONARY:  Clear to auscultation bilateral with a normal work of  breathing.  ABDOMEN:  Decreased bowel sounds.  Nontender.  No masses.  EXTREMITIES:  Right BKA with prosthetic.  Left transmetatarsal  amputation with large foul-smelling ulcer with discharge on the plantar  surface which is soaking through the dressing.   LABS:  CBG greater than 400.  Urinalysis showed a glucose of 500,  ketones 15, protein greater than 300.   ASSESSMENT AND PLAN:  This is a 35 year old male with poorly-controlled  diabetes mellitus, type 1, presenting with dehydration, hyperglycemia,  and ketonuria.  1. Diabetes mellitus.  Patient is stable, not clearly in diabetic      ketoacidosis at this point but with ketonuria and hyperglycemia.      We will continue Lantus 45 units at night and start sliding scale      insulin.  Check electrolytes to determine anion gap.  2. Dehydration.  Bolus with 1000 mL of normal saline, then start half       normal saline at 125 mL per hour.  3. Hypertension.  We will hold amlodipine and benazepril for now.      Patient is stable.  4. Hyperlipidemia.  We will continue atorvastatin, and check liver      function tests in the morning.  5. Ortho.  Patient has a left below-knee amputation pending and was      planning to see his surgeon today to discuss the date for the      procedure.  He sees Oncologist, D.P.M., in [place].  This is      at the [facility], telephone number [phone].  I have called Dr. Sheran Lawless to let him know that Mr.      Caldwell is being admitted.  6. Fluids, electrolytes, nutrition/gastrointestinal.  Patient is      n.p.o. for now.  Can start clear liquids and then advance to carb-      modified diet once his blood sugars have stabilized.  7. Disposition.  Pending stabilization of blood glucose.  Would also      contact Dr. Sheran Lawless to see how he would like to schedule any      upcoming procedures for the foot.      Graciella Belton, MD  Electronically Signed      [name], M.D.  Electronically Signed    MO/MEDQ  D:  01/31/2008  T:  02/01/2008  Job:  [id number]

## 2011-05-13 NOTE — Discharge Summary (Signed)
[place]. [facility]  Patient:    [name], [name]                     MRN: [id number] Adm. Date:  [id number] Disc. Date: [id number] Attending:  Schuyler Amor Dictator:   Bettey Mare. Pamella Pert, M.D.                           Discharge Summary  PRIMARY PHYSICIAN:  Cleopatra Cedar, M.D.  ORTHOPEDIST:  Newt Minion, M.D.  CHIEF COMPLAINT:  Left and right necrotic foot.  HISTORY OF PRESENT ILLNESS:  The patient has been noncompliant with diabetes medications, has had several previous amputations of toes, and came to the emergency room for fever, dehydration, and foot pain.  HOSPITAL COURSE:  Please see dictated history and physical exam.  The patient was started on Zosyn.  Dr. Sharol Given of orthopedics was called for evaluation.  The patient had radiologic films of both of his feet, which were evident of possible osteomyelitis bilaterally.  The patient was started on Zosyn and started on insulin and was taken to the OR on February 19, 2000 for a left transmet.  Dr. Sharol Given elected to hold off on doing a Ray on the right.  The patient is going to follow up in he foot clinic at [facility], as well as with Dr. Sharol Given for continued monitoring. The patient is to follow up with Dr. Lovett Sox in the family practice center for continuous diabetes management.  The patient had no problems over hospital course. The patient did see Dr. Carmon Ginsberg and Dr. Ina Kick from family practice psychology for evaluation for depression versus other DSM criteria of possibilities of why the patient has been noncompliant with medications.  The patient did not  meet any DSM criteria and was ascertained as being competant, alert and oriented x 4.  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  The patient was also seen by nutrition and diabetic coordinator.  He  received extensive education on both.  The patient was given a glucometer and also given the [facility] diabetic program, where he can receive continuous education and any help with resources regarding his diabetes.  At this point, the patient just simply needs to take some initiative in taking care of his chronic  health problem.  DISCHARGE DIAGNOSES: 1. Diabetes mellitus. 2. Osteomyelitis of the left foot and necrotic foot. 3. Peripheral vascular disease and necrotic areas of the right plantar MTP base.  DISCHARGE MEDICATIONS: 1. Augmentin 875 mg p.o. b.i.d. x 14 days. 2. Insulin 70/30 40 units in the morning and 30 units in the afternoon.  The patient was given a vial of insulin, as well as Augmentin, to go home on, as he has considerable financial concerns.  The patient has Medicaid pending at the moment.  Possible future needs would include placing the patient on an ACE inhibitor and an aspirin, as well as continue physical therapy.  We will evaluate compliance with the patient, as well as financial concerns regarding future therapy.  The patient is being discharged on crutches and will receive a PT consult for instruction before discharge. DD:  02/24/00 TD:  02/24/00 Job: [id number]

## 2011-05-13 NOTE — Discharge Summary (Signed)
NAME:  [name], [name]                     ACCOUNT NO.:  [id number]   MEDICAL RECORD NO.:  [id number]                   PATIENT TYPE:  INP   LOCATION:  5002                                 FACILITY:  [facility]   PHYSICIAN:  Leonides Schanz, M.D.                DATE OF BIRTH:  06/24/1976   DATE OF ADMISSION:  03/25/2003  DATE OF DISCHARGE:  03/31/2003                                 DISCHARGE SUMMARY   DISCHARGE MEDICATIONS:  1. Hydrochlorothiazide 25 mg p.o. daily.  2. Novolog insulin q.a.c. 8 units.  3. Lantus insulin 60 units subcu daily.  4. Altace 20 mg p.o. daily.  5. Keflex 500 mg p.o. q.6h. time full course of six weeks.  Antibiotics     started on 03/27/03.  6. Atenolol 50 mg p.o. daily.  7. Vicodin 5/500 q.6h. p.r.n. pain.  8. Vitamin C 500 mg p.o. daily.  9. Multivitamin one capsule p.o. daily.   DISCHARGE DIAGNOSES:  1. Diabetic cellulitis infection over right diabetic ketoacidosis and left     foot ulcer.  2. Insulin-dependent diabetes mellitus.  3. Hypertension.  4. Prepatellar bursitis.  5. Status post below-the-knee amputation.  6. History of depression.   PRIMARY CARE PHYSICIAN:  Remo Lipps A. Lavella Hammock, M.D.   CONSULTATIONS:  Newt Minion, M.D.   HISTORY AND PHYSICAL:  For full History and Physical, please see note in  chart.   HISTORY OF PRESENT ILLNESS:  This is a 35 year old known type I insulin  dependent diabetic with history of noncompliance and multiple admissions for  DK in the past who presented at the [facility] with lower  extremity ulcers status post right BKA.  The patient presents with left foot  ulcer times two weeks with some purulent drainage.  Also, reports some  swelling and pain in his right stump for three to four days.  There is an  ulcer that has developed on the stump which he says has popped up on this  day.  The patient has not been checking CBGs or taking his insulin daily.  Diabetes has been overall poorly  controlled.  He has developed some fevers,  chills and decreased appetite as well as some increasing right BK stumping.  No dysuria, no polyuria, no polydipsia and no rashes were noted.  The  patient had poor social situation and at that time has been homeless in the  past.  Has currently been staying with an aunt on occasions.   PHYSICAL EXAMINATION:  VITAL SIGNS:  Temperature 100.6, blood pressure  149/91, pulse 114, respirations 22, O2 saturations 95%.  EXTREMITIES:  The patient has a 5-6 cm ulcer on the plantar surface of his  left foot.  The right stump reveals a 2-3 cm ulcer with purulent drainage  and warmth from erythema, positive left pedal edema and right stump shows  some prepatellar edema.   DISCHARGE DIAGNOSES:  1. Lower extremity ulcers and cellulitis.  These lesions were likely     secondary to poorly controlled diabetes.  The patient had ESR sed rate of     119 and glucose of 438 upon admission.  Cultures were started before     broad spectrum antibiotics were begun.  The patient has history of MRSA     culture from his wound back in 1/04 and will need aggressive wound care     for these lesions to ensure adequate healing.  MRI of the distal tibia     was undertaken which showed no signs of ostia.  X-ray of the left foot     also shows no signs of ostia.  The patient was to be started on strict     nonweightbearing on the right BKA and aggressive wound care to both     lesions was ordered by Dr. Sharol Given.  The patient was started on IV     Vancomycin and IV Zosyn initially for broad spectrum antibiotic coverage     secondary to MRSA.  The patient had hemoglobin A1C measure that was 15.9,     most likely caused the patient's poor wound healing.  Cultures taken from     the right wound showed abundant staph aureus, abundant group B strep that     were pain sensitive except for resistance to penicillin.  Left foot     cultures grew out nothing.  Blood cultures grew out no growth  to date.     Once the patient had blood cultures back, the patient was switched over     to IV Ancef for 24 more hours and then switched over to p.o. Keflex for     prolonged course at approximately six weeks.  The patient will be     transferred to a skilled nursing facility on the day of discharge where     he will get continued aggressive wound care and be able to continue p.o.     antibiotics.  The patient will follow up with Dr. Sharol Given for continued     further evaluation of his wound.  The patient is to control his diabetes     for better wound healing.  2. Insulin-dependent diabetes mellitus.  The patient has a long his of     medication noncompliance and had not been taking his medications prior to     admission.  The patient's hemoglobin A1C was 15.9 upon admission.  The     patient was restarted on Lantus 55 units in the morning and further     titrated up with Novolog pan injections to cover a meal.  Coordinator     also saw patient and agreed with this plan.  The patient was titrated up.     At time of discharge, he was on 60 units of Lantus in the morning and 8     units of Novolog 10 injections q.a.c.  The patient's blood sugars were     running in the 140s to 200s at this point in time and could even use     further titration as an outpatient.  The patient is able to administer     his own injection and demonstrates understanding that a tight glucose     control will further allow him to have better wound healing.  3. Hypertension.  The patient came in hypertensive on admission.  The     patient has history of mild hypertension before.  The patient was taking  12.5 mg of hydrochlorothiazide and 15 mg of Altace upon admission.  He     tended to have problems with his blood pressure and increased his Altace     to 20 mg p.o. daily and hydrochlorothiazide 25 mg p.o. daily.  At that    point in time, the patient was still unable to maintain adequate blood     pressures and was  started on Atenolol 25 mg p.o. daily and further     increased to up to 50 mg p.o. daily.  At that point in time, blood     pressures were fairly in control with systolic in the 0000000.  The patient     does likely need to continue further titration on the outside for optimal     control.  4. Prepatellar bursitis.  The patient had a small amount of fluid that     increased with a right patella that was fluctuant and somewhat     nonerythematous, nontender, that was present on admission.  The amount of     fluid continued to increase throughout hospital course, and the patient     had fluid drained on 03/29/02 in usual sterile preparation.  Fluid was sent     for Gram staining cultures, possible source of infection despite the     patient not having any fevers or elevated white count.  At time of     admission, blood Gram stains and cultures were still pending and will     need to be followed up for possible change and antibiotic coverage.    DISPOSITION:  The patient will be discharged on 03/31/03 in stable condition  to go to nursing facility for further assistance in wound care as well as  diabetic care before being transitioned back to section 8 housing and once  applications are filed and complete.  At this point in time, the patient  needs continued skilled nursing assistance for his wound.  The patient will  follow up with Dr. Sharol Given at his discretion.                                               Leonides Schanz, M.D.    RM/MEDQ  D:  03/31/2003  T:  03/31/2003  Job:  [id number]   cc:   Remo Lipps A. Lavella Hammock, M.D.  Fam. Med - Resident - [place], [place] [address]  Fax: [phone]   Newt Minion, M.D.  [address]  [place]  [place] [address]  Fax: [phone]

## 2011-05-13 NOTE — Discharge Summary (Signed)
[place]. [facility]  Patient:    [name], ROUGHTON Visit Number: [id number] MRN: [id number]          Service Type: MED Location: [phone] Attending Physician:  Schuyler Amor Dictated by:   Dell Ponto, M.D. Admit Date:  08/20/2001 Discharge Date: 08/29/2001   CC:         Dr. Genelle Bal, M.D.   Discharge Summary  DISCHARGE DIAGNOSES: 1. Diabetes type 1. 2. Below knee amputation. 3. Hypertension.  PROCEDURE:  Right below knee amputation on August 23, 2001.  DISCHARGE MEDICATIONS: 1. Insulin 70/30 40 units q.a.c. breakfast and 35 units q.p.m. 2. Lantus 10 units q.h.s. 3. Aspirin 325 mg q.d. 4. Altace 15 mg q.a.m. and 5 mg q.p.m. 5. Lescol 20 mg q.h.s. 6. Paxil 20 mg q.d. 7. Percocet one to two q.6h. p.r.n.  HISTORY OF PRESENT ILLNESS:  The patient is a 35 year old, African-American male with poorly controlled diabetes mellitus type 1 who presented to the [facility] with a three-day complaint of increased foul odor in the right foot and increasing tenderness.  He was also complaining of chills, fever and nausea.  The patient was then admitted.  HOSPITAL COURSE:  #1 - DIABETES MELLITUS:  The patient has very poor control at home and is noncompliant.  On admission, his UA had greater than 1000 glucose and greater than 80 ketones with a glucose on his Chem 7 of 399.  His sugars were difficult to control in the hospital and his medical regimen had to be changed quite frequently.  His sugars ranged from the mid 80s to a high of about 370.  Initially, his 70/30 insulin was increased and was increased to 55 units in the morning and 45 units at night with the patient still having high CBGs.  It was decided to add Lantus 10 units at bedtime and decrease his insulin to the dose that he was discharged on which is 40 units after breakfast and 35 units at night.  The patient was also placed on an  aspirin, Lescol and Altace which he was suppose to be on as an outpatient.  The patient needs these for a vascular and renal protective effect.  It is hoped that the patient will try to control his sugars and be more compliant with his medical regimen given the fact that he is status post BKA.  This was discussed with him multiple times while in the hospital and it is known that multiple doctors have tried to encourage more compliance.  #2 - BELOW KNEE AMPUTATION:  The patient underwent a below knee amputation on August 29, due to gangrene in the right foot where his first two toes were amputated.  The foot was not salvageable.  Orthopedics was forced to do a BKA. The patient tolerated this well and had no postop fevers.  He did have a postop thrombocytosis and on the day of discharge, his platelets were up to 654.  This will likely need to be followed up with his primary care physician. The patient complained of pain initially after the procedure and was placed on a PCA pump.  This was discontinued quickly after his BKA and the patient did fine on Percocet.  He was discharged on Percocet q.6h. p.r.n.  The patient has home health PT and home health care seeing him and he will need to follow up with Dr. Sharol Given.  #3 - HYPERTENSION:  The patients blood pressures were  quite high throughout most of his stay.  He was taken off his Altace initially and placed back on it on postop day #1.  Still with the Altace at his home regimen, his blood pressure was still trending high with systolic blood pressures in the 150s to 123XX123 and diastolics in the 123XX123 to 123XX123.  His Altace was then increased to 15 in the morning and 5 at night and this is what he was discharged home.  His primary care physician will also need to follow up on that.  DIET:  The patients medical regimen was discussed with him and it was very strongly stressed that he be compliant with this.  He was also told to restrict his  carbohydrates, fats and cholesterol.  SPECIAL INSTRUCTIONS:  He was informed that his home health nurse and physical therapist will be out to visit him.  He was also told that a prosthetic limb would have to be made and the appropriate contact person would be in contact with him.  FOLLOWUP:  He was also given his appointment with Dr. Lahoma Crocker on September 11, at 1:40 p.m. and was informed to please be on time.  CONDITION ON DISCHARGE:  The patient was discharged to home in good condition and ambulating well with the assistance of a rolling walker. Dictated by: Dell Ponto, M.D. Attending Physician:  Schuyler Amor DD:  08/29/01 TD:  08/30/01 Job: [id number]

## 2011-05-13 NOTE — Discharge Summary (Signed)
[name], [name]           ACCOUNT NO.:  [id number]   MEDICAL RECORD NO.:  [id number]          PATIENT TYPE:  INP   LOCATION:  5009                         FACILITY:  [facility]   PHYSICIAN:  Karlton Lemon, M.D.    DATE OF BIRTH:  24-Oct-1976   DATE OF ADMISSION:  07/24/2006  DATE OF DISCHARGE:  07/27/2006                                 DISCHARGE SUMMARY   ADMISSION DIAGNOSES:  1.  Left lower extremity cellulitis.  2.  Diabetes mellitus.  3.  Hypertension.  4.  Hyponatremia.  5.  Hypokalemia.  6.  Hyperlipidemia.   DISCHARGE DIAGNOSES:  The same plus anemia.   HISTORY AND PHYSICAL:  See full H&P for complete History and Physical.  In  brief, this is a 35 year old African-American male with a history of  diabetes, amputation, and hypertension who presented with a 2-day history of  fever, anorexia and some warmth red streaking on the left lower extremity  with areas of abscess formation.  Patient was diagnosed with left lower  extremity cellulitis, and was also put on antibiotics.   HOSPITAL COURSE:  1.  Cellulitis:  Patient had blood cultures drawn and an x-ray of the left      lower extremity done, which showed no evidence of osteomyelitis.  He was      started on vancomycin and Zosyn the morning of the 30th, which was      transitioned to vancomycin and Unasyn on July 31st, and eventually to      Augmentin to take p.o. for a total of 4 weeks per orthopedic      recommendations.  Orthopedics performed incision and drainage of the      abscesses on the left lower extremity, and placed a dressing over this,      and started hydrotherapy to the area once daily.  He was afebrile and      feeling much better up to discharge.  2.  Diabetes mellitus:  The patient is on Lantus 60 units q.h.s. at home as      well as a sliding scale based on his premeal blood glucose measurements.      He had an A1c drawn here, which was 10.8, suggesting he has not been      controlled.  He also  had blood glucoses that were up to 400 on      admission.  Diabetes consult was obtained, and he was continued with his      home regimen as well as adding NovoLog 4 units before meals along with a      sliding scale.  His blood glucoses ranged from 70-256 on the day of      discharge.  3.  Hypertension:  He was noted to be hypertensive both on admission and      throughout hospital stay.  Norvasc 10 mg, benazepril 20 mg and Toprol XL      150 mg.  His benazepril was increased on the day prior to discharge to      40 mg daily.  Prior to discharge his blood pressures were running about  140s/90s, but given that he had only been given 1 dose of his Ace      inhibitor no changes were made at the time to be followed up by Dr.      Dennard Schaumann in clinic.  4.  Hyponatremia:  This was felt to be due to the volume decrease due to the      fact that he was not drinking or eating prior to admission.  He was      hydrated with IV fluids, normal saline, and his sodium level was noted      to fall within the reference range prior to discharge.  5.  Hypokalemia:  Patient was repleted with K-Dur during the hospital stay.  6.  Hyperlipidemia:  Continue Lipitor.  7.  Gastroesophageal reflux:  Patient was continued on his Protonix of 40 mg      daily.  8.  Anemia:  Patient had a ferritin drawn here, which suggested iron      deficiency as well as his hemoglobin on admission was 9.1.  We started      ferrous sulfate here, and the patient refused a rectal exam to guaiac      stools.  He will continue to be monitored for any changes in this as an      outpatient.   DISCHARGE CONDITION:  Improved, good.   PROCEDURES:  Patient had an incision and drainage of abscess formation on  the left lower extremity.   FOLLOWUP:  Patient to followup with Dr. Sharol Given in clinic in about 1 week, and  also with Dr. Dennard Schaumann.  He has an appointment with Dr. Dennard Schaumann on August  13th.  Patient to call MD for any increasing  fevers, worsening of the left  lower extremity or any other concerns.           ______________________________  Karlton Lemon, M.D.     SH/MEDQ  D:  07/27/2006  T:  07/27/2006  Job:  [id number]   cc:   Newt Minion, MD  Cammie Mcgee. Pedro Earls, MD

## 2011-05-13 NOTE — H&P (Signed)
[place]  Patient:    [name], [name]                     MRN: [id number] Adm. Date:  [id number] Attending:  Vickki Muff, Richard Mark Iv Dictator:   Bettey Mare. Pamella Pert, M.D.                         History and Physical  CHIEF COMPLAINT:  [name] is a 35 year old African-American male with the chief complaint of nausea, vomiting, and abdominal pain x 2 days.  The patient states he took his insulin last night.  The patient has not been checking his sugars.  He  states he does not have his glucometer machine any more, and unsure where his normal glucose runs.  The patient saw Dr. Cleopatra Cedar in January.  The patient also complains of a headache x 4 days, which has been sharp and band-like, decreased p.o. intake over the last two days, and intermittent shortness of breath x 1 days.  The shortness of breath is not related to exertion.  He has no chest  pain, and the shortness of breath seems to occur with increasing headache pain. No hematemesis, normal bowel movements, no bright red blood per rectum.  REVIEW OF SYSTEMS:  Cardiac:  No chest pain.  Intermittent shortness of breath.  GI:  Nausea and vomiting x 2 days.  No diarrhea.  Neuro:  No change in vision. A band-like headache.  Respiratory:  Intermittent shortness of breath.  Skin:  No  rashes.  Psych:  Alert and oriented x 3.  GU:  No polyuria, no dysuria, no discharge, no hematuria.  Hematologic:  No hematemesis, no melena.  PROBLEM LIST: 1. Diabetes mellitus type 1, poorly controlled. 2. Amputation.  The patient has had his first and second toes amputated on    the left foot, and the first, second, and fifth toes of the right foot. 3. Anemia.  CURRENT MEDICATIONS:  Insulin 70/30, 40 units q.a.m., 30 units q. night.  SOCIAL HISTORY:  The patient has a history of marijuana use.  Last time was three days ago.  He is homeless. He currently lives with his aunt.  Education  is through the 11th grade.  Denies cocaine or other IV drug use.  No alcohol, no cigarettes.  ALLERGIES:  No known drug allergies.  FAMILY HISTORY:  Positive for diabetes mellitus, hypertension, CVA.  OTHER MEDICAL DOCTORS:  Dr. Newt Minion of orthopedics.  PHYSICAL EXAMINATION:  VITAL SIGNS:  Temperature 98 degrees, respirations 22, pulse 121, blood pressure 134/80.  Initial CBG was 261, repeat CBG 217 after 1 L of normal saline.  HEENT:  Tympanic membranes clear bilaterally.  Pupils equal, round, reactive to  light.  Extraocular movements intact.  Oropharynx is clear with no erythema or exudate.  The patient does have a whitish film on his tongue.  NECK:  Supple, no lymphadenopathy.  LUNGS:  Clear to auscultation bilaterally.  No retractions or wheezing.  CARDIOVASCULAR:  He has a [id number] systolic ejection murmur.  ABDOMEN:  Soft, nontender, nondistended.  Positive bowel sounds.  EXTREMITIES:  The patient has 1+ edema at the midfoot bilaterally.  He has a 2.0 cm x 3.0 cm ulcer on the right plantar surface of his first metatarsal.  He has a .0 cm x 3.0 cm ulcer on the plantar surface of his left fifth toe metatarsal.  SKIN:  No rashes.  The patient  does have some skin peeling on the bottom of his  feet.  NEUROLOGIC:  Cranial nerves II-XII grossly intact.  LABORATORY DATA:  I-STAT:  Sodium 131, potassium 5.8, CO2 of 23, chloride 100, UN 15, glucose 312, a pH of 7.421.  Hematocrit 47, hemoglobin 16.  PCO2 of 34.7.  ASSESSMENT:  Diabetes mellitus.  This is poorly controlled.  The patient has a ong history of hospitalizations and has already received several amputations of his  toes, and has a history of osteomyelitis, secondary to poor control.  PLAN:  Will continue aggressive rehydration and place on sliding scale insulin, and call orthopedics, regarding his foot ulcers.  Will get blood and wound cultures. The patient has a calf refill of three to four  seconds on the remaining toes bilaterally; however, does have good pedal pulses bilaterally.  No evidence of cellulitis at this time.  Will check an x-ray of his feet for osteomyelitis. Check his BUN and creatinine, CBG q.a.c. and q.h.s.  Hemoglobin A1C.  Estimate the patients dehydration at 10%.  Will place the patient on Zosyn for his diabetic ulcers, after cultures have been obtained. DD:  02/20/00 TD:  02/20/00 Job: [id number]

## 2011-05-13 NOTE — Discharge Summary (Signed)
[facility]  Patient:    [name], [name] Visit Number: [id number] MRN: [id number]          Service Type: MED Location: [phone] Attending Physician:  Zigmund Gottron Dictated by:   Tonye Pearson, M.D. Admit Date:  01/31/2001 Discharge Date: 02/03/2001   CC:         Cleopatra Cedar, M.D.  Newt Minion, M.D.   Discharge Summary  DISCHARGE DIAGNOSES: 1. Cellulitis, right foot. 2. Insulin-dependent diabetes mellitus, type 1. 3. Depression.  DISCHARGE MEDICATIONS: 1. Altace 10 mg p.o. q.a.m., 5 mg p.o. q.p.m. 2. Paxil 20 mg p.o. q.d. 3. Tylox one pill p.o. q.6h. p.r.n. pain. 4. Viagra 50 mg use as directed. 5. Insulin 70/30 - 45 units subcu q.a.m. and 45 units subcu    q.p.m. 6. Augmentin 875 mg one p.o. b.i.d. x 14 days.  HISTORY OF PRESENT ILLNESS:  This 35 year old male with insulin-dependent diabetes mellitus with multiple complications presented with a one day history of discharge from the right foot, complaining of intermittent throbbing pain. Denies nausea, vomiting and fever.  No chills or sweats.  History of multiple surgeries for foot and toe infection.  All of the toes on the left foot have been amputated as well as the first and second toes of the right foot.  REVIEW OF SYSTEMS:  Cardiovascular: No palpitations, no chest pain. Respiratory: No shortness of breath.  GI:  No diarrhea.  Skin: Positive dry skin on feet.  Neurologic: Decreased sensation in both feet.  Musculoskeletal: Positive right foot pain.  ENT: Positive cough and congestion.  Genitourinary: No dysuria.  PAST MEDICAL HISTORY:  Insulin-dependent diabetes mellitus type I.  Major depressive disorder.  Post traumatic stress disorder.  Impotence.  MEDICATIONS: 1. Altace 10 mg q.a.m. and 5 mg q.p.m. 2. Insulin 70/30 - 45 units subcu q.a.m. and 45 units q.p.m. 3. Paxil 20 mg p.o. q.d. 4. Tylox one p.o. q.6h. p.r.n. foot pain. 5. Viagra 50 mg  p.r.n.  ALLERGIES:  No known drug allergies.  SOCIAL HISTORY:  Positive tobacco.  Denies alcohol.  History of homelessnes3s.  FAMILY HISTORY: Grandmother with hypertension and diabetes and a CVA.  PHYSICAL EXAMINATION:  VITAL SIGNS:  Temperature 98.4, blood pressure 131/83, heart rate 88, respirations 18, oxygen saturation 100% on room air.  GENERAL:  This is a disheveled patient wearing dirty socks with unkempt feet.  HEENT:  Normocephalic, atraumatic.  Extraocular movements intact.  Pupils are equally round and reactive to light and accommodation. Oropharynx clear.  No erythema, no discharge.  NECK:  Supple with no lymphadenopathy, no thyromegaly.  CARDIOVASCULAR:  Regular rate and rhythm; no murmur, rub or gallop.  PULMONARY:  Lungs are clear to auscultation bilaterally.  Nonlabored respirations.  ABDOMEN:  Soft, nontender, nondistended, positive bowel sounds.  No organomegaly.  MUSCULOSKELETAL:  Mild right foot edema.  Pedal pulses present and symmetric. Decreased sensation at the level of the ankles bilaterally in a stocking distribution.  No right first or second toes and bilateral eczema on the feet  LABORATORY DATA:  Sodium 138, potassium 3.4, chloride 98, bicarbonate 30, BUN 8, creatinine 0.6, glucose 354.  White blood cells 7.1, hemoglobin 12.9, hematocrit 38.2, platelets 249,000.  X-ray of the right foot showed no definitive osteomyelitis.  Blood cultures were negative.  A bone scan showed no osteomyelitis.  HOSPITAL COURSE: #1 - CELLULITIS:  The patient was admitted for IV antibiotics and started on Unasyn which was switched to Zosyn to provide for Pseudomonas coverage while  cultures were pending.  The wound nurse was consulted, and a callous over the first right toe stump was unroofed in sterile fashion using a #15 blade. There were no complications and no purulent discharge was noted.  Underlying granulation tissue appeared pink and healthy.  The patient  remained afebrile and denied any pain in his right foot due to diabetic neuropathy.  Care management was consulted to assist the patient in obtaining appropriate supportive foot wear, and the patient was provided with a prescription for supportive orthopedic shoes and given the names and addresses of orthotic agencies that accept Medicare.  At discharge, the wound appeared to be healing well and the patient understands that he is to keep it clean and dry and that it may require further debridement on follow-up.  His orthopedist, Dr. Sharol Given, was notified of this hospitalization, and [name] is scheduled to see him next week.  #2 - DIABETES MELLITUS:  [name] repeated had hypoglycemic episodes while in the hospital.  His 70/30 insulin was decreased to 30 units in the a.m. and his diet was increased to 2200 kilocalories with snacks.  However, he continued to become hypoglycemic primarily during the overnight hours.  His daytime blood sugars were generally within an acceptable range.  On discharge, he will resume his home insulin regimen but this may need to be adjusted as an outpatient if he continues to experience hypoglycemia.  #3 - DEPRESSION:  He continued to receive Paxil and appeared stable.  FOLLOW-UP:  Follow up will be with Dr. Cleopatra Cedar at the [facility] on Monday, February 05, 2001, at 2:30 p.m. for a wound check as well as with Dr. Sharol Given no February 07, 2001. Dictated by:   Tonye Pearson, M.D. Attending Physician:  Zigmund Gottron DD:  02/03/01 TD:  02/05/01 Job: [id number]

## 2011-05-13 NOTE — Consult Note (Signed)
[place]  Patient:    [name]                      MRN: [id number] Proc. Date: 11/07/99 Adm. Date:  [id number] Attending:  Axel Filler                          Consultation Report  HISTORY OF PRESENT ILLNESS:  Patient is a 35 year old gentleman with type II insulin-dependent diabetes, though states he has not been taking his insulin for the past three or four days, who presents with a two-week history of an ulcerative left second toe.  Patient states he was most recently hospitalized for cellulitis of the left great toe and was just recently discharged.  Patient is status post an amputation of the right great toe and right second toe by Dr. Louanne Skye and Dr. Blenda Mounts. Patient states that he has difficulty controlling his diabetes due to his not having a consistent place to live or consistent place to eat.  OBJECTIVE EXAMINATION:  He has ischemic, thin, atrophic skin bilateral lower extremities.  Both feet are warm.  There is no cellulitis in the left or right foot.  He has good dorsalis pedis pulses and posterior tibial pulses in both feet. He is status post a right great toe and second toe amputation as mentioned above. Left foot shows ischemic necrotic left second toe, which probes to bone, consistent with a Wagner grade 3 ulcer.  Left great toe also has some cellulitis and skin color changes consistent possible chronic osteomyelitis.  Radiographs show osteomyelitis of the second toe; no definitive osteo of the left great toe.  ASSESSMENT: 1. Wagner grade 3 ulcer, left second toe. 2. Possible osteomyelitis, left great toe.  PLAN:  We will go ahead and he will be admitted.  Insulin and glucose control.  Will be placed on IV antibiotics.  Will plan to get a bone scan tomorrow. Patient has good pulses, do not feel he has a vascular condition.   After the bone scan, we will proceed with a second and possible first toe  amputation.  Plan for a minimum of 48 hours prior to surgery.  Bone scan tomorrow. DD:  11/07/99 TD:  11/07/99 Job: [id number]

## 2011-05-13 NOTE — Discharge Summary (Signed)
NAME:  [name], [name]                     ACCOUNT NO.:  [id number]   MEDICAL RECORD NO.:  [id number]                   PATIENT TYPE:  INP   LOCATION:  [id number]                                 FACILITY:  [facility]   PHYSICIAN:  Blima Ledger, M.D.           DATE OF BIRTH:  07-30-1976   DATE OF ADMISSION:  06/24/2003  DATE OF DISCHARGE:  06/25/2003                                 DISCHARGE SUMMARY   DISCHARGE DIAGNOSES:  1. Hematemesis, resolved.  2. Diabetes type 1.  3. Right axillary abscess.  4. Left foot plantar wound.  5. Hypertension.  6. Acute renal failure.   DISCHARGE MEDICATIONS:  1. Keflex 500 mg p.o. t.i.d. x7 days.  2. Reglan 10 mg p.o. q.i.d. for the next two days (take approximately 30     minutes prior to meals and at bedtime).  3. Altace 20 mg p.o. daily.  4. Atenolol 50 mg p.o. b.i.d.  5. Lantus insulin 60 units subcu q.h.s.  6. Novolog sliding scale insulin as previously directed.  7. Lasix 40 mg p.o. daily.  8. Multivitamin p.o. daily.  9. Viagra p.r.n.  10.      Pain control with Vicodin as previously prescribed by Dr. Lavella Hammock or     Tylenol 650 mg q.6h. p.r.n. pain.   ACTIVITY:  As tolerated.   DIET:  Diabetic low-salt diet.   WOUND CARE:  Per home health.   SPECIAL INSTRUCTIONS:  Call your doctor or come to the ER if you develop  high fever, chills, worsening swelling or redness around your wound, chest  pain, or shortness of breath.  Follow up July 04, 2003 at 10:25 a.m. with Dr.  Lavella Hammock at [facility]   BRIEF ADMISSION HISTORY:  [name] is a 35 year old man with poorly  controlled diabetes type 1 who presented to the emergency room with  hematemesis.  The patient stated he began vomiting on the morning of  admission and that his emesis consisted of gross blood, as well as coffee  grounds.  He stated he had approximately 10 episodes, a few which were  apparently in the emergency department.  His emesis was followed by  crampy  upper abdominal pain.  He denied any recent NSAID use or alcohol and was  seen on the day prior to admission for a right axillary abscess which was  incised and drained but for which he had not yet begun antibiotics although  he had a prescription.  He has no history of GI bleed or of a GI workup.  The patient denied any fevers.  On admission, he was afebrile with a blood  pressure initially elevated at 145/100 but then decreased to 106/61.  Pulse  was 100 initially, decreasing to 88.  Respiratory rate was 20 and his O2  saturation was 98%-99% on room air.  He appeared in no acute distress.  His  abdomen was soft, tender to palpation of the epigastrium, but  nontender with  no hepatosplenomegaly.  His oropharynx showed dry mucous membranes.  His  heart was regular rate and rhythm with no murmurs, rubs, or gallops.  He did  have a right axillary abscess that had been incised and packing had been  removed.  The skin around the abscess did appear erythematous.  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  Initial labs showed a normal white count of 4.7,  hemoglobin of 12.9, lipase of 33.  Creatinine elevated from his baseline at  1.2 and an elevated glucose of 572.  For the remainder of the history and  physical, please see the dictated note.   CONSULTATIONS:  Wound care.   PROCEDURES:  None.   STUDIES:  Acute abdominal series on May 24, 2003 showed no evidence of  obstruction or free air.  A nonspecific bowel gas pattern with gas-filled  loops of small bowel at the upper limits of normal size were seen in the  central abdomen.  Cardiac silhouette was within normal limits and the lungs  were clear.   HOSPITAL COURSE:  Problem 1:  HEMATEMESIS:  During his hospitalization, the  patient had no recurrence of hematemesis and his hemoglobin remained stable  following a 1-point drop likely secondary to IV fluid hydration.   Initial  hemoglobin was 12.9 which decreased to 11.8 but then remained stable with  hemoglobin at the time of discharge of 11.9.  Stool was guaiac negative.  Helicobacter pylori antibody was negative at 0.72.  The patient, however,  remained hemodynamically stable throughout hospitalization and was able to  tolerate a diabetic diet prior to discharge.  It is possible that his  vomiting and hematemesis may represent diabetic gastroparesis with Mallory-  Weiss tear.  Reglan was given prior to discharge and the patient was sent  home with a two-day supply of Reglan as well.  The patient was to follow up  with his primary doctor on July 9.  The patient was placed on a PPI on  admission and this is something that could be considered as an outpatient if  deemed necessary.  As the patient was hemodynamically stable with a stable  hemoglobin, there was deemed no need for endoscopy.   Problem 2:  TYPE 1 DIABETES:  The patient's blood glucose was greatly  elevated on admission.  He received IV fluid hydration and was maintained on  his home medicine and covered with sliding scale insulin.  His last  hemoglobin A1c was done on June 05, 2003 and was elevated at 8.9.  The  patient is to follow up as an outpatient for diabetic management.   Problem 3:  RIGHT AXILLA ABSCESS:  On the day prior to admission, the  patient had a right axillary hydradenitis suppurativa lesion incised and  packed and was given a prescription for Keflex.  The patient did not fill  his prescription prior to admission and during hospitalization was seen by  wound care.  He recommended packing the incision with Iodoform gauze which  should be changed daily.  The incision did appear linear and was  erythematous and indurated.  The patient does have home health nurses who  come over to his home occasionally for dressing changes and they were to be  informed of the new orders by the social worker.  The patient's abscess should be  evaluated at his next followup visit.   Problem 4:  LEFT  FOOT PLANTAR WOUND:  This was also seen by wound care  during hospitalization and has been followed by the [facility]  and Dr. Sharol Given weekly as an outpatient.  The patient is to continue wound care  per Dr. Sharol Given with Neosporin ointment and Kerlix bandages changed daily.  Home health nursing will be coming to see him daily for wound care dressing  changes.   Problem 5:  HYPERTENSION:  The patient's blood pressure was elevated on  admission but decreased with pain control, as well as control of his emesis.  The patient was continued on all of his outpatient medications except for  his Lasix which was held while we were rehydrating him; however, at the time  of discharge he was restarted on all of his medications including his Lasix.  Blood pressure over the last 24 hours prior to discharge ranged from [id number]  systolic and [id number] diastolic.  The patient needs followup as an outpatient  for better blood pressure control.   Problem 6:  ACUTE RENAL FAILURE:  On admission, the patient's creatinine was  slightly elevated at 1.2, up from his baseline of 0.7-0.8.  This was deemed  likely prerenal secondary to emesis.  The patient received IV fluids during  hospitalization but no repeat creatinine was obtained.  Creatinine should be  followed as an outpatient as deemed necessary.                                               Blima Ledger, M.D.    JM/MEDQ  D:  07/02/2003  T:  07/03/2003  Job:  [id number]   cc:   Remo Lipps A. Lavella Hammock, M.D.  Fam. Med - Resident - [place], [place] [address]  Fax: [phone]    cc:   Loura Back. Lavella Hammock, M.D.  Fam. Med - Resident - [place], [place] [address]  Fax: [phone]

## 2011-05-13 NOTE — Discharge Summary (Signed)
[place]  Patient:    [name], [name] Visit Number: [id number] MRN: [id number]          Service Type: FTC Location: FOOT Attending Physician:  Newt Minion Dictated by:   Joretta Bachelor, M.D. Admit Date:  02/27/2001 Discharge Date: 05/28/2001   CC:         Newt Minion, M.D.  Cleopatra Cedar, M.D.   Discharge Summary  DATE OF BIRTH:  26-Aug-1976  DISCHARGE DIAGNOSES: 1. Diabetes mellitus type 1, with end-organ complications. 2. Infected foot ulcers on right foot.  OTHER PERTINENT DIAGNOSES: 1. Status post right transmetatarsal foot amputation secondary to infected    foot ulcers. 2. Depression. 3. Post-traumatic stress disorder. 4. Impotence.  DISCHARGE MEDICATIONS: 1. Insulin 70/30 40 units subcutaneous q.a.m. and 35 units subcutaneous q.p.m. 2. Altace 5 mg 1 pill q.a.m. and 2 pills q.p.m. 3. Paxil 20 mg 2 pills q.d. 4. Cipro 750 mg 1 tablet b.i.d. for four weeks.  SPECIAL INSTRUCTIONS:  The patient was instructed not to walk on his right foot.  He was instructed to use crutches.  DIET:  The patient is to follow a diabetic diet at home.  WOUND CARE:  The patient was instructed to soak foot each day with Dial soap and water and then to apply ______ gel and dry dressing.  FOLLOW-UP: 1. Diabetic foot clinic Tuesday, April 10, 2001, at 2:30 p.m. with Dr. Sharol Given. [place] with Dr. Lovett Sox on Thursday, April 12, 2001.  HISTORY OF PRESENT ILLNESS:  This is a 35 year old African-American male who presented to the [facility] Emergency Department because he stated that he had three days prior to admission run out of insulin, and he stopped taking his insulin.  He also stated that he had stopped taking care of his foot, which had a very large pressure ulcer on it.  It had been followed by the diabetic foot clinic, but he had not been there in three weeks, and since that time it had become increasingly  painful and developed a foul-smelling discharge.  On presentation the patient was noted to be afebrile, but three large ulcers were noted on his foot; one down to his heel, one very close to his fifth digit, and the last one where his great toe would have been.  The great toe ulcer had a foul-smelling discharge and seemed to be quite deep.  Therefore, this, along with the patient having a [name] glucose of 484 on admission, prompted [place] to admit him for treatment of infected foot wound as well as his hyperglycemia. Of note, with the patients high glucose on admission, his urine was negative for ketones, and he had a bicarbonate of 31.  Therefore, he was judged to be hyperglycemic and not in diabetic ketoacidosis.  ADMISSION LABORATORY DATA:  The patient had a white count of 8.0, hemoglobin of 13.3, platelets of 292.  Sodium 129, potassium 3.9, chloride 93, bicarbonate 31, BUN 11, creatinine 0.8, glucose 484, calcium 8.8, total protein 7.2, albumin 3.0, AST of 12, ALT of 11, alkaline phosphatase 152, bilirubin 0.9.  UA showed specific gravity of greater than 1.04, greater than 1000 glucose, trace hemoglobin, negative for ketones.  HOSPITAL COURSE: #1 - INFECTED FOOT ULCER:  The patient was admitted and begun on fairly broad-spectrum antibiotics.  He was begun on Zosyn 3.375 g IV q.6h., and two [name] cultures were drawn from the patient at the time of admission.  Dr. Sharol Given in orthopedics  was subsequently called to see this patient.  Dr. Sharol Given felt that he had a Wegeners stage I ulcer on his first digit and lateral right heel.  Over the fourth and fifth toes he had a Wegeners stage III ulcer down to the tendon.  After Dr. Sharol Given suggested beginning wound care with Dial soap and ______ gel after a couple of days of antibiotics and wound care, the patient was thought not to have osteomyelitis.  During his hospitalization he also had a plain film of his foot which did not appear to have  changes consistent with osteomyelitis.  Therefore, Dr. Sharol Given decided that the patient did not need an emergent amputation of that foot and that he could be followed up as an outpatient at the diabetic foot clinic, and an appointment was made for [name] to do this.  I believe he was to follow up at the foot clinic and have his small toe removed as an outpatient.  Prior to discharge the patient had completed a seven-day course of IV Zosyn and was changed to p.o. antibiotics prior to discharge.  #2 - DIABETES MELLITUS TYPE 1, HYPERGLYCEMIA:  The patient was admitted hyperglycemic but, as he had no evidence of ketoacidosis, it was decided to simply start the patient back on his home regimen which he initially suffered a mild hypoglycemic episode secondary to, but this his sugars were gotten under somewhat better control although he did have a couple of episodes where his [name] sugars were down into the 40s and 50s during hospitalization. Therefore, it was decided to cut back slightly on his insulin regimen.  He had previously been receiving 45 units in the morning of 70/30 and 40 units in the evening.  This was cut back to 40 units in the morning and 35 units in the evening, and the patient was sent home on this regimen.  DISCHARGE LABORATORY DATA:  On the day of discharge the patient had a white count of 4.8 with 23% neutrophils, 73% lymphocytes, hemoglobin 13.8, hematocrit of 40.9, and platelet count of 356.  Sodium 138, potassium 3.5, chloride of 100, bicarbonate 31, BUN 11, creatinine 0.7, glucose 143, calcium 8.7, ALT of 11, alkaline phosphatase of 152, and total bilirubin of 0.9. Hemoglobin A1C 13.1.  A C-peptide was performed and was found to be 0.5. Dictated by:   Joretta Bachelor, M.D. Attending Physician:  Newt Minion DD:  04/11/01 TD:  04/12/01 Job: [id number]

## 2011-05-13 NOTE — H&P (Signed)
NAME:  [name], [name]                     ACCOUNT NO.:  [id number]   MEDICAL RECORD NO.:  [id number]                   PATIENT TYPE:  EMS   LOCATION:  [facility]                                 FACILITY:  [facility]   PHYSICIAN:  Blane Ohara McDiarmid, M.D.             DATE OF BIRTH:  11/04/1976   DATE OF ADMISSION:  06/24/2003  DATE OF DISCHARGE:                                HISTORY & PHYSICAL   CHIEF COMPLAINT:  Hematemesis.   HISTORY OF PRESENT ILLNESS:  The patient is a 35 year old African-American  male with poorly controlled diabetes mellitus type 1 who presents with  hematemesis.  He began vomiting this morning.  All episodes showed gross  blood mixed into the vomitus.  He did have some coffee ground emesis in the  emergency department.  He has had approximately 10 episodes and a few of  them have been witnessed in the ED.   There has been no recent NSAID use.  He denies alcohol.  He was seen  yesterday for an axillary abscess which was debrided.  He has not begun his  antibiotics yet.  He denies any new changes in his diet.  He has no history  of GI bleeding, and no history of an EGD or colonoscopy.   REVIEW OF SYSTEMS:  No fevers, no dyspnea.  He does have some sharp right-  sided chest pain that radiates across to his left side, as well as  epigastric tenderness.  No dysuria, hematuria, or polyuria.  No cough.  He  does have the abscess in his axilla as well as a mid-foot ulcer on the left  side.  He has a right BKA, and his CBG's have been poorly controlled, and  the patient does not check them.   PAST MEDICAL HISTORY:  1. Diabetes mellitus type 1.  2. History of BKA.  3. Depression.  4. Post-traumatic stress disorder.  5. Impotence.  6. Hypertension.   PAST SURGICAL HISTORY:  1. 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  2. Left mid-foot amputation.   MEDICATIONS:  1. Altace 20 mg daily.  2. Atenolol 50 mg b.i.d.  3. Sliding scale insulin.  4. Lantus 60 units q.h.s.  5. Lasix  40 mg daily.  6. Multivitamin one p.o. daily.  7. Viagra 100 mg p.r.n.  8. Vicodin 5/500 mg p.r.n.  9. Vitamin C daily.  10.      Keflex which has not been filled.  *The patient has not taken any medications this morning.   ALLERGIES:  No known drug allergies.   SOCIAL HISTORY:  He has a history of marijuana use and tobacco.  He  currently denies any drugs or alcohol.  He has a history of being homeless.  He has recently been approved for Medicaid.   FAMILY HISTORY:  His grandmother has hypertension, diabetes mellitus, and a  history of a cerebrovascular accident.   PHYSICAL EXAMINATION:  VITAL SIGNS:  Temperature 98.1, blood  pressure  145/100, then 130/96, then 106/61, respirations 20, pulse 100, then 88,  saturating 98 to 99% on room air.  GENERAL:  He is somnolent, but in no acute distress.  HEENT:  Pupils equal, round, reactive to light.  Extraocular movements were  intact.  Oropharynx showed poor dentition with dry mucous membranes.  NECK:  No lymphadenopathy, no thyromegaly.  LUNGS:  Clear to auscultation bilaterally with good air movement.  CARDIOVASCULAR:  Regular rate and rhythm.  No murmurs, rubs, or gallops.  ABDOMEN:  Soft, tenderness to palpation over the epigastrium, nondistended,  no hepatosplenomegaly.  EXTREMITIES:  Lower extremities:  There is a right BKA, and a left mid-foot  amputation.  There is a pressure ulcer on the left mid-foot.  The right and  left extremities showed no cyanosis, clubbing, or edema.  SKIN:  There is a right axillary abscess.  The packing was removed.  The  skin surrounding the abscess is quite erythematous.  RECTAL:  Heme negative, no masses, no stool in the vault.  NEUROLOGIC:  Cranial nerves II-XII intact.  The patient moves all four  extremities.   LABORATORY DATA:  White blood cell count 4.7, hemoglobin 12.9, platelets  293, 52% segmented neutrophils, 36% lymphocytes.  Lipase 33.  Sodium 133,  potassium 4.3, chloride 95,  bicarbonate 27, BUN 13, creatinine 1.2, glucose  572, calcium 9.2.  Alkaline phosphatase 172, albumin 3.1, AST 15, ALT 12,  bilirubin 0.5.  He is O positive, antibody negative.   ASSESSMENT AND PLAN:  This is a 35 year old male with diabetes mellitus type  1 and hematemesis.  1. Hematemesis.  The upper GI bleed differential includes gastritis, gastric     or duodenal ulcer, Mallory-Weiss tear, or Zollinger-Ellison syndrome.     This is most likely gastritis secondary to either nonsteroidal anti-     inflammatory drug (NSAID) use, although he has not taken any recently, or     Helicobacter pylori.  This could also be secondary to stress from poorly     controlled diabetes mellitus.  We will monitor serial hemoglobins.  Start     Protonix 80 mg IV.  No nonsteroidal anti-inflammatory drug(NSAIDS) while     in house.  We will check H. pylori.  Consider GI consult if bleeding     persists or hemoglobin drops.  Also Phenergan p.r.n.  Also consider     variceal bleeding, but there is no history of cirrhosis or hepatitis.     His alkaline phosphatase is elevated, but this may be secondary to     dehydration.  2. Diabetes mellitus.  His glucose is quite high on admission.  Continue IV     fluid hydration.  Resume his home medications and cover with insulin     resistant sliding scale.  His last hemoglobin A1C was 8.9 in June 2004.     However, he has a history of hemoglobin A1C in the 17's.  3. Hypertension.  His blood pressure was elevated on admission, and has now     decreased with pain control and control of his emesis.  Continue home     medications, except we will hold Lasix while re-hydrating.  4. Wound care.  He does have an abscess and a pressure ulcer.  We will start     him on Keflex and consult wound care.  5. Acute renal failure.  His baseline creatinine is 0.7 to 0.8.  He is     likely prerenal azotemia secondary to his  emesis.  We will follow his creatinine. 6. Fluids,  electrolytes, and nutrition.  We will follow his electrolytes.     Continue IV fluids.  Ice chips and sips for now until we are certain that     his condition is stable.     Wolfgang Phoenix, M.D.                        Acquanetta Sit, M.D.    LC/MEDQ  D:  06/24/2003  T:  06/24/2003  Job:  [id number]   cc:   Remo Lipps A. Lavella Hammock, M.D.  Fam. Med - Resident - [place], [place] [address]  Fax: [phone]    cc:   Loura Back. Lavella Hammock, M.D.  Fam. Med - Resident - [place], [place] [address]  Fax: [phone]

## 2011-05-13 NOTE — Op Note (Signed)
[place]. [facility]  Patient:    [name], [name]                     MRN: [id number] Proc. Date: 05/30/00 Adm. Date:  [id number] Disc. Date: [id number] Attending:  Meridee Score V                           Operative Report  PREOPERATIVE DIAGNOSIS:  Status post irrigation and debridement of large abscess, right lower extremity.  POSTOPERATIVE DIAGNOSIS:  Status post irrigation and debridement of large abscess, right lower extremity including soft tissue to the knee extending to the ankle.  OPERATION PERFORMED:  Irrigation and debridement of skin, soft tissue, muscle and bone.  Application of VAC.  Partial wound closure.  SURGEON:  Newt Minion, M.D.  ANESTHESIA:  General endotracheal.  ESTIMATED BLOOD LOSS:  300 cc.  ANTIBIOTICS:  Received preoperatively.  DISPOSITION:  To PACU in stable condition.  COMPLICATIONS:  None.  INDICATIONS FOR PROCEDURE:  The patient is a 35 year old gentleman status post massive abscess of his right lower extremity.  He initially underwent irrigation and debridement, was then placed on daily pulse lavage and wet-to-dry dressing changes.  The wound has granulated in nicely.  The necrotic heel pad has improved and the patient presents at this time for repeat irrigation and debridement.  The risks and benefits of repeat irrigation and debridement with the risk of further surgery, persistent infection, need for below-knee amputation were discussed.  The patient states he would rather proceed with irrigation and debridement and assume the risks of further surgery rather than proceed with a below-knee amputation at this time.  DESCRIPTION OF PROCEDURE:  The patient was brought to operating room 14 and underwent a general endotracheal anesthetic.  After an adequate level of anesthesia was obtained, the patients right lower extremity was prepped using Betadine paint, Betadine scrub and draped into a sterile field.  The  wound edges of the previous wound which extended from the anterior medial aspect of his knee extending all the way down to the tarsal tunnel as well as a large aspect of the lateral aspect of his heel, these were debrided down to bleediong contractile muscle tissue.  No purulence.  The wounds were irrigated with pulse lavage.  There was good petechial bleeding.  The proximal aspect of the wound from the knee to the ankle was closed using far-near-near-far stitch without any tension on the skin.  The distal aspect of the wound medially and laterally was covered with a VAC sponge, sealed with Ioban.  This was hooked to 125 mmHg suction.  There was a good suction.  The patient was extubated and taken to post anesthesia care unit in stable condition.  Anticipate VAC treatment for one week in the hospital and then discharged to home with home [facility] treatment. DD:  05/30/00 TD:  06/01/00 Job: [id number]

## 2011-05-13 NOTE — H&P (Signed)
[name], PARROW NO.:  [id number]   MEDICAL RECORD NO.:  [id number]          PATIENT TYPE:  OBV   LOCATION:  5009                         FACILITY:  [facility]   PHYSICIAN:  Helane Rima, MD     DATE OF BIRTH:  [birth year]   DATE OF ADMISSION:  07/23/2006  DATE OF DISCHARGE:                                HISTORY & PHYSICAL   PRIMARY CARE PHYSICIAN:  Cletus Gash T. Pedro Earls, MD, [facility]   CHIEF COMPLAINT:  Fever, chills for two days with a warm, red, left lower  extremity.   HISTORY OF PRESENT ILLNESS:  This is a 35 year old black male with history  of diabetes, amputation, hypertension with a two day history of fever,  anorexia in his left lower extremity with some warmth and red streaking.  The patient was seen by Dr. Sharol Given earlier in the week for shaving of some  calluses.  The patient denies recent sick contacts, but he has not been  taking his insulin due to decreased p.o. intake.  He had CBG's in the 400's  on arrival to the ED.   REVIEW OF SYSTEMS:  Significant for anorexia for two days with fever.  No  chest pain.  No shortness of breath.  No nausea, vomiting.  Again, as per  HPI.  Remainder of review of systems is unremarkable.   PAST MEDICAL HISTORY:  1.  Type 1 diabetes.  2.  History of diabetic ketoacidosis.  3.  Major recurrent depression.  4.  Post traumatic stress disorder.  5.  Organic impotence.  6.  Hypertension.  7.  Hyperlipidemia.  8.  GERD.   MEDICATIONS:  1.  benazepril 20 mg p.o. daily.  2.  Lantus 60 units q.h.s. with NovoLog sliding scale.  3.  Lipitor 40 mg daily.  4.  Norvasc 10 mg daily.  5.  Protonix 40 mg b.i.d.  6.  Toprol XL 150 mg daily.  7.  Vicodin 5/500 as needed.   I think some of these medications are combined in the form of Diovan and  Caduet.   ALLERGIES:  No known drug allergies.   OTHER PAST MEDICAL HISTORY:  He has had a right BKA done in 2002.   FAMILY HISTORY:  Significant for  hypertension, diabetes and CVA in his  grandmother.   SOCIAL HISTORY:  Per his MEMR, he has admitted to using marijuana in the  past, although, when asked in ED, he states he has never tried it or any  other drugs and never will.  He says that he has stopped smoking for 8 days,  but does have a one and a half pack per day history.  Denies any alcohol  use.  The patient does have a history of homelessness, but is not a case  currently.   PHYSICAL EXAMINATION:  VITAL SIGNS:  Temperature 99.1-101.4, pulse 88,  respirations 20, blood pressure range 106-170/62-88.  He is saturating 100%  on room air.  GENERAL:  No acute distress.  Alert and oriented x3.  HEENT:  Normocephalic, atraumatic.  Extraocular muscles are intact.  Oral  mucosa  dry, but pink.  NECK:  Supple.  LUNGS:  Clear to auscultation without wheezes, rales or rhonchi.  HEART:  Regular rate and rhythm without murmurs, rubs or gallops.  ABDOMEN:  Soft, positive bowel sounds.  EXTREMITIES:  An indication of all of his left toes with large area of  callous on the bottom of the foot with some deep crevices yet small  collections of blood, but no pus.  There is some hyperpigmentation along the  vessels, and the extremity is warm, and he has a right BKA.  Pulses are +2.  The patient refused rectal exam.  NEUROLOGICAL:  Cranial nerves II-XII are intact.  Reflexes are 5/5.  Skin is  described as above.  No rash.  No lymphadenopathy.   LABORATORY DATA:  Sodium 129, potassium 3.3, chloride 95, CO2 28, BUN 10,  creatinine 1.7, glucose 415 which was later 214 with some time in the ED.  Albumin 2.3, calcium 8, white count 12.0, hemoglobin 9.9, hematocrit 29.3,  platelets 267,000, 78% neutrophils, 11% lymphocytes.   ASSESSMENT/PLAN:  1.  Left cellulitis, given streaking of vessels, fever, and source for      infection.  Will obtain blood cultures and have plane films of the foot      done.  He may need MRI for further evaluation.  Will  start Vancomycin      and Zosyn and consider calling Dr. Sharol Given in the morning.  Will monitor      for fevers and other systemic symptoms.  2.  Diabetes.  The patient is poorly controlled.  Will check hemoglobin A1C,      start him on his home Lantus with sliding scale.  He will need education      on use of insulin on sick days as the patient really stopped using his      insulin when he was not eating.  3.  Hypertension.  The patient has a wide range of blood pressures in the      ED.  It is difficult to ascertain his home regimen.  Will continue      Toprol and Caduet equivalent and also benazepril.  Will hold the Diovan      for now and monitor.  4.  Hyponatremia.  Likely due to volume depletion as the patient has not      been taking p.o.  Will hydrate with normal saline.  Will recheck in the      morning.  5.  Hypokalemia.  __________.  6.  Hyperlipidemia.  Continue Lipitor and Protonix 40 mg p.o. daily.      Helane Rima, MD     TH/MEDQ  D:  07/24/2006  T:  07/24/2006  Job:  [id number]

## 2011-05-13 NOTE — Discharge Summary (Signed)
NAMEOZZIE, [name] NO.:  [id number]   MEDICAL RECORD NO.:  [id number]          PATIENT TYPE:  INP   LOCATION:  4739                         FACILITY:  [facility]   PHYSICIAN:  Harmon Pier, M.D.   DATE OF BIRTH:  October 20, 1976   DATE OF ADMISSION:  02/10/2005  DATE OF DISCHARGE:  02/13/2005                                 DISCHARGE SUMMARY   Dictated by Lucy Chris, M.S. for Harmon Pier, M.D.   ATTENDING PHYSICIAN:  Newton A. Walker Kehr, M.D.   CONSULTATIONS:  None.   DISCHARGE DIAGNOSES:  1.  Gastroenteritis.  2.  Diabetes type 1.  3.  Hypertension.   DISCHARGE MEDICATIONS:  1.  Lantus 20 units subcutaneously at bedtime for one day then 40 units      subcutaneously at bedtime.  2.  Norvasc 10 mg p.o. daily.  3.  Toprol XL 25 mg p.o. daily.  4.  Phenergan 25 mg p.o. q.4-6h PRN nausea.  5.  Novolog insulin sliding scale as directed.   PROCEDURES:  1.  An electrocardiogram on February 10, 2005 showed sinus tachycardia.  2.  Chest x-ray on February 10, 2005 showed mild cardiac enlargement, no      active disease.   HISTORY OF PRESENT ILLNESS:  This is a 35 year old African-American male  with a history of type 1 diabetes, status post right below knee amputation  and left toe amputations who presented with an episode of nausea, vomiting,  diarrhea and weakness for one day.  He was also significant for having  hypertension for a few days.  The patient has had inadequate p.o. intake and  has not been able to take his medications.   LABORATORY DATA:  On admission troponin-I was 0.02.  CK 312.  CK-MB 1.9.  Index was 0.6.  The patient's sodium was 141, potassium 3.6, chloride 107,  bicarb 27, glucose 289, BUN 12, creatinine 1.3, calcium 8.3.  Blood cultures  X2 were negative.  The patient's urinalysis showed clear urine with specific  gravity of 1.027 with greater than 1000 glucose, bilirubin negative, ketones  negative, blood moderate, greater than 300 protein,  negative for nitrite and  leukocytes.   HOSPITAL COURSE:  PROBLEM #1:  GASTROENTERITIS:  The patient was given  Phenergan for nausea and vomiting and intravenous fluids to stay hydrated.  The patient's nausea and vomiting resolved over the next two days.  The  patient never ran a temperature higher than 100 while in the hospital.   PROBLEM #2:  DIABETES TYPE I:  The patient was initially put on sliding  scale insulin as he could not keep anything down.  The patient was started  back on his regular Lantus dose 20 mg subcutaneously for one day and then  back to 40 subcutaneously each day after that.   PROBLEM #3: HYPERTENSION:  The patient was initially put on a Lopressor drip  and then was switched to his home medications once he could take p.o.   FOLLOW UP ISSUES:  None.      PM/MEDQ  D:  02/15/2005  T:  02/15/2005  Job:  [id number]

## 2011-05-13 NOTE — H&P (Signed)
[place]  Patient:    [name], [name]                     MRN: [id number] Adm. Date:  [id number] Attending:  McDiarmid, Blane Ohara. Dictator:   Jerilynn Birkenhead, M.D. CC:         Cleopatra Cedar, M.D.                         History and Physical  PROBLEM LIST: 1. Diabetic ketoacidosis. 2. Diabetic foot ulcer. 3. Headaches. 4. Dehydration. 5. History of type 1 diabetes x 7 years.  CHIEF COMPLAINT:  Headache.  HISTORY OF PRESENT ILLNESS:  [name] is a 35 year old man who complaints of headaches and "bad migraines" for the past six days.  Patient states that he had a history of migraines but they have never been this bad before.  He describes it as over his frontal lobes and it is bilaterally.  He has not noticed any particular throbbing.  He does have photophobia.  He does have eversion to loud noises.  He states he also has vision changes and nausea and vomiting.  Of note, patient has not checked his sugar for "a while" because he ran out of test strips.  Patient also complains of being thirsty.  He denies polyuria.  Patient also has a sore on his foot, it has been present for approximately one week and he never noticed this before.  PAST MEDICAL HISTORY:  See problem list.  MEDICATIONS:  Insulin 70/30, 4 units q.a.m., 30 units q.p.m.  ALLERGIES:  No known drug allergies.  PAST SURGICAL HISTORY:  He had a left toe amputations in 2000 and right toe amputations several years ago.  SOCIAL HISTORY:  He currently lives with his grandmother.  He is unemployed. He applied for disability.  He denies alcohol, tobacco or drug use.  FAMILY HISTORY:  His grandmother has diabetes mellitus, hypertension and CVA. No cancer that he is aware of.  REVIEW OF SYSTEMS:  No fever.  Positive chills.  No dysuria.  Positive cough. Positive headache.  Positive nausea and vomiting.  Positive diarrhea. Positive sore on his foot x 10 days.  Decreased appetite.   He does admit to a recent weight gain.  PHYSICAL EXAMINATION:  VITALS:  Temperature 98.8, heart rate 106, respiratory rate 18, blood pressure 158/90.  GENERAL:  He is lying in bed with a towel over his face.  He has acetone on his breath.  HEENT:  He is normocephalic, atraumatic.  Oropharynx:  He has dry mucous membranes.  He has poor dentition.  His tympanic membranes are occluded bilaterally by cerumen.  His nares are without discharge.  His extraocular movements are intact.  NECK:  No JVD.  No lymphadenopathy.  CHEST:  Clear to auscultation bilaterally.  No wheezes, rales or rhonchi appreciated.  Normal ______ .  CARDIOVASCULAR:  Tachycardic with irregular rhythm, 2/6 systolic ejection murmur.  ABDOMEN:  Positive bowel sounds.  Soft, nontender, nondistended.  EXTREMITIES:  He has a right heel wound.  Purulence is noted.  He has no toes on his left foot.  No obvious edema is present.  He has 2+ dorsalis pedis pulses bilaterally.  NEUROLOGIC:  He is alert and oriented x 4.  Cranial nerves II-XII are grossly intact.  LABORATORY DATA:  Sodium is 128, potassium 3.7, chloride 96, bicarb 12, BUN 11 and glucose 334.  An i-STAT shows a pH of 7.309  and a PCO2 of 23.2 and anion gap calculated to be 20.  IMAGING STUDIES:  CT of his head is pending.  ASSESSMENT AND PLAN:  This is a 35 year old with type 1 diabetes who presents with headaches as his primary complaint.  He is found to be in diabetic ketoacidosis and his headache is probably secondary to his dehydration.  Will check a CT scan to rule out intracranial pathology.  Will need to admit to a step-down unit for intravenous insulin and diabetic ketoacidosis control. Check complete blood glucoses q.2h. and basic metabolic panel [id number]., alternating.  Add potassium to intravenous fluids.  Push intravenous fluids. Dr. Carolann Littler. [name] has already been consulted for the foot ulcer.  Will give intravenous Cipro for gram-negative  organisms and clindamycin for anaerobes and gram-positives.  Dial soaks and elevate right foot per orthopedics.  Will monitor closely overnight.  DD:  05/17/00 TD:  05/18/00 Job: [id number]

## 2011-05-13 NOTE — Op Note (Signed)
   NAME:  [name], [name]                     ACCOUNT NO.:  [id number]   MEDICAL RECORD NO.:  [id number]                   PATIENT TYPE:  INP   LOCATION:  5033                                 FACILITY:  [facility]   PHYSICIAN:  Mark C. Lorin [name], M.D.                 DATE OF BIRTH:  1976-02-21   DATE OF PROCEDURE:  09/21/2003  DATE OF DISCHARGE:                                 OPERATIVE REPORT   PREOPERATIVE DIAGNOSIS:  Left foot diabetic abscess.   POSTOPERATIVE DIAGNOSIS:  Left foot diabetic abscess.   PROCEDURES:  Debridement of skin and subcutaneous tissue of left foot.   SURGEON:  Mark C. Lorin [name], M.D.   ASSISTANT:  None.   ANESTHESIA:  General.   TOURNIQUET:  None.   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 Sharol Given in years past.  He has a hard callus with a grade 3 ulcer and foul-  smelling purulent drainage.   DESCRIPTION OF PROCEDURE:  The foot was debrided of thick calluses with a 10  blade, which took about 10 minutes and multiple centimeters of thick, hard  callus were removed.  The ulcer was then cored out, opened, and extended  distally and proximally 1.5 cm and Weitlaner retractor placed.  There was  necrotic subcutaneous tissue extending in the plantar surface, as well as  proximally and laterally toward the base of the fifth.  Those areas were  resected.  Pulsatile lavage was used.  Intermittent irrigation followed by  further debridement was performed.  A hemostat was used for probing.  The  infection extended down to the bone with necrotic subcutaneous tissue, but  no obvious osteomyelitis.  After irrigation and further debridement of thick  callus, Iodoform Nu-Gauze was packed into the incision.  Then 4 x 4s,  Kerlix, and Ace wrap were applied.  The patient tolerated the  procedure well  and was transferred to the recovery room in stable condition.                                               Mark C. Lorin [name], M.D.    MCY/MEDQ  D:  09/21/2003  T:  09/22/2003  Job:  [id number]

## 2011-05-13 NOTE — Op Note (Signed)
[place]  Patient:    [name]                      MRN: [id number] Proc. Date: 11/09/99 Adm. 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  POSTOPERATIVE DIAGNOSIS:  Osteomyelitis, left great toe with a Wagner grade 3 ulcer with necrotic left second toe.  PROCEDURE:  Amputation left great and left second toe.  SURGEON:  Newt Minion, M.D.  ANESTHESIA:  General endotracheal.  ESTIMATED BLOOD LOSS:  Minimal.  TOURNIQUET TIME:  None.  ANTIBIOTICS:  Cefotan and Cipro preoperatively x 48 hours.  DRAINS:  None.  COMPLICATIONS:  None.  DISPOSITION:  To PACU in stable condition.  INDICATIONS FOR PROCEDURE:  The patient is a 35 year old gentleman with type 2 insulin-dependent diabetes who presented two days ago with a necrotic left second toe with osteomyelitis of the left great toe.  He was placed on IV antibiotics or 48 hours to resolve the cellulitis and presents at this time for amputation of he great and second toe.  The risks and benefits of surgery were discussed with the patient including infection, neurovascular injury, need for further reamputation, nonhealing of the wound.  The patient states he understands and wishes to proceed at this time.  DESCRIPTION OF THE PROCEDURE:  The patient was brought to the OR #14 and underwent a general anesthetic.  After adequate level of anesthesia was obtained, the patients left lower extremity was prepped using Betadine paint, draped into sterile field with stockinette covering all exposed skin.  A fish-mouth incision was made to amputate the great and second toe at the MPP joint.  Hemostasis was  obtained with electrocautery.  There was good petechial bleeding at the wound edges.  The wound was irrigated with  normal saline.  The wound was closed loosely with a far-near near-far stitch.  There was no tension on the skin. The wound was covered with Adaptic orthopedic sponges, ABD dressing, sterile Webril, and a loosely wrapped Coban. The patient was extubated and taken to the PACU in stable condition. DD:  11/09/99 TD:  11/10/99 Job: [id number]

## 2011-05-13 NOTE — Discharge Summary (Signed)
[name], [name] NO.:  [id number]   MEDICAL RECORD NO.:  [id number]          PATIENT TYPE:  INP   LOCATION:  5010                         FACILITY:  [facility]   PHYSICIAN:  Clifton Custard, M.D.      DATE OF BIRTH:  28-Nov-1976   DATE OF ADMISSION:  01/01/2006  DATE OF DISCHARGE:  01/04/2006                                 DISCHARGE SUMMARY   PRIMARY CARE PHYSICIAN:  Dr. Delila Spence.   DISCHARGE DIAGNOSES:  1.  Gastritis, likely secondary to alcohol use.  2.  Type 1 diabetes mellitus.  3.  Hypertension.  4.  Hyperlipidemia.  5.  Gastroparesis secondary to diabetes mellitus.   DISCHARGE MEDICATIONS:  1.  Protonix 40 mg p.o. b.i.d.  2.  Reglan 10 mg p.o. t.i.d.  3.  Diovan 80 mg p.o. q. day.  4.  Lipitor 40 mg p.o. q. day.  5.  Norvasc 10 mg p.o. q. day.  6.  Toprol XL 150 mg p.o. q. day.  7.  Vicodin 5/500 p.r.n. pain.  8.  Lantus 50 units injected q.h.s.  9.  Diflucan 100 mg p.o. q. day x7 days.   CONSULTATIONS:  GI, Dr. Fuller Plan.   PROCEDURES:  EGD on January 02, 2006 showed a focal gastritis likely due to  vomiting.  Biopsies were taken during the EGD, which showed rare structures  suspicious for budding yeast forms, which may indicate the presence of  candida.  Also noted were eosinophils in the epithelium, which raises the  possibility of changes secondary to gastroesophageal reflux disease.  There  was no intestinal metaplasia seen, and there was no cellular atypia.   IMPORTANT LABORATORY DATA:  Hemoglobin A1C was 11.1, LDL was 90, HDL 53,  triglycerides were 141, total cholesterol was 171, creatinine at discharge  was 1.3.  LFTs were within normal limits.  Cardiac enzymes were negative x1.  Discharge hemoglobin was 11.1, discharge hematocrit 32.2.   BRIEF HISTORY OF PRESENT ILLNESS:  The patient is a 35 year old male with  type 1 diabetes and hypertension, and hyperlipidemia, who on the day prior  to admission had been drinking alcohol.  He then  developed nausea and  vomiting, and noticed some blood in his emesis, as well as some coffee  ground substance while in the ER.  The patient was admitted for nausea and  vomiting likely secondary to acute gastritis secondary to alcohol abuse.   HOSPITAL COURSE:  1.  GI.  The patient was admitted and IV fluids were started.  The patient's      was controlled with Phenergan and Zofran p.r.n.  The patient was started      on Protonix 40 mg IV b.i.d.  Patient was also continued on his Reglan 10      mg t.i.d.  We obtained a GI consult due to the presence of blood in his      emesis.  Endoscopy showed gastritis.  The patient was started on      Diflucan 100 mg IV.  This was changed to p.o. when the patient was      tolerating oral diet.  This  will be continued for a 10-day course given      the biopsy results above.  The patient will continue the proton pump      inhibitor twice a day for at least six weeks.  The patient will continue      Reglan t.i.d. for gastroparesis.  As an outpatient, we could potentially      consider changing from Reglan to Zelnorm due to decreased long-term side      effects with Zelnorm.  However, this would have to be balanced against      the cost differential of these two medicines.  2.  Diabetes mellitus.  The patient was initially given half his regular      dose of Lantus, which was 25 units subcu while in the hospital while he      was n.p.o.  As the patient's diet was advanced, he was restarted on his      home medication dose of 50 units subcu q.h.s.  The patient was also      maintained on sliding scale insulin while in the hospital.  The      patient's hemoglobin A1C was checked and was 11.1.  Reminded the patient      about the importance of medication compliance.  3.  Hypertension.  The patient's blood pressures were elevated systolically      [id number] while he was unable to take his oral blood pressure medications due      to nausea.  The patient's Diovan  was also held initially due to an      increased creatinine.  His creatinine returned to normal prior to      discharge and all of his home blood pressure medications, which include      Diovan, Norvasc, and Toprol were restarted prior to discharge.  The      patient's blood pressure was 130/72 at the time of discharge.  4.  Hyperlipidemia.  The patient's cholesterol panel was noted as above with      an LDL of 90.  The patient was instructed to continue his Lipitor at 40      mg by mouth once daily.  5.  Chronic renal insufficiency.  The patient's creatinine was initially      elevated at 1.9 likely secondary to acute dehydration from vomiting.      Patient's creatinine dropped to 1.3 prior to discharge as a result of      rehydration.  6.  Fluid and electrolytes.  Patient was progressively re-hydrated with IV      fluids.  IV fluids were decreased as his diet was advanced.  Patient did      have a low potassium in the hospital, which was replaced, but his      potassium at the time of discharge was 3.7.   FOLLOW UP APPOINTMENTS:  Dr. Delila Spence.  Phone number [phone] on Tuesday,  January 10, 2006 at 9:15 a.m.   SPECIAL INSTRUCTIONS:  The patient was instructed to avoid drinking alcohol.  Patient is also reminded of the importance of compliance with his diabetic  and hypertensive medications.      Clifton Custard, M.D.     MR/MEDQ  D:  01/04/2006  T:  01/04/2006  Job:  [id number]   cc:   Elpidio Galea, M.D.  [address] [place], [place] [address]   Pricilla Riffle. Fuller Plan, M.D. [facility]  [address]  [place] [address]

## 2011-05-13 NOTE — Discharge Summary (Signed)
NAME:  [name], [name]                     ACCOUNT NO.:  [id number]   MEDICAL RECORD NO.:  [id number]                   PATIENT TYPE:  INP   LOCATION:  5022                                 FACILITY:  [facility]   PHYSICIAN:  V[place]illion A. Walker Kehr, M.D.                 DATE OF BIRTH:  [birth year]   DATE OF ADMISSION:  01/06/2003  DATE OF DISCHARGE:  01/12/2003                                 DISCHARGE SUMMARY   INTERN:  Dr. Remo Lipps A. [name].   DISCHARGE DIAGNOSES:  1. Cellulitis.  2. Diabetes mellitus.  3. Hypertension.   DISCHARGE MEDICATIONS:  1. Altace 50 mg p.o. daily.  2. Hydrochlorothiazide 12.5 mg p.o. daily.  3. K-Dur 20 mg p.o. daily.  4. Lantus 55 mg p.o. q.a.m.  5. Bactrim DS b.i.d.  6. Vicodin one q.6h. p.r.n. pain.  7. Bacitracin ointment b.i.d. to t.i.d.   DISCHARGE INSTRUCTIONS:  He was discharged with instructions to adhere to an  ADA diet and instructed to call the [facility] for an  appointment with Dr. Lavella Caldwell for the Thursday after his discharge.   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  Please see H&P for further details.   Admission exam showed multiple purulent bullae with surrounding erythema on  his right stump as well as a 1.5-cm in diameter lesion with a black necrotic  center and surrounding erythema and edema proximal to his stump.   Admission labs include sodium 126, potassium 3.7, chloride 87, bicarb 28,  BUN 11, creatinine 1.1, glucose 707; white count 13.5, hemoglobin 12.4,  platelets 303,000.   #1 - CELLULITIS:  The patient was placed on Ancef 1 g IV q.8h. and the  lesions were cultured.  They grew out methicillin-resistant Staph aureus  which was susceptible to Bactrim; patient was therefore sent home on Bactrim  for an extended course, approximately two weeks.   #2 - DIABETES:  The patient had an initial anion gap of 21,  was placed on  70/30 insulin as well as a sliding scale.  He was also given 2 L IV bolus  and then maintenance fluids at 100 mL/hr in addition to his diet.  The  patient improved greatly over the course of his hospitalization, except in  terms of his compliance.  He made it abundantly clear that he would not take  medicine more than once a day; he was therefore switched to Lantus in hopes  that he will at least have decent fasting blood sugars.   #3 - HYPERTENSION:  Patient with no history of hypertension, question  whether this is secondary to pain.  He was placed on Altace 10 mg p.o. daily  and hydrochlorothiazide and his hydrochlorothiazide will be stopped as an  outpatient if his blood pressures remain well-controlled, which they have  been throughout  his hospitalization.     Loura Back [name], M.D.                     [place] A. Walker Kehr, M.D.    SAG/MEDQ  D:  03/06/2003  T:  03/07/2003  Job:  [id number]

## 2011-05-13 NOTE — Discharge Summary (Signed)
[name], [name]            ACCOUNT NO.:  [id number]   MEDICAL RECORD NO.:  [id number]          PATIENT TYPE:  INP   LOCATION:  5732                         FACILITY:  [facility]   PHYSICIAN:  Waldron A. Walker Kehr, M.D.    DATE OF BIRTH:  [birth year]   DATE OF ADMISSION:  01/04/2007  DATE OF DISCHARGE:  01/08/2007                               DISCHARGE SUMMARY   ADMISSION DIAGNOSES:  1. Intractable nausea, vomiting.  2. Dehydration.  3. Acute renal failure on chronic renal insufficiency.  4. Type 1 diabetes, uncontrolled and noncompliant.  5. Depression.  6. History of posttraumatic stress disorder.  7. Chronic left foot ulcer.  8. Hypertension.  9. Hyperlipidemia.  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   DISCHARGE DIAGNOSES:  1. Diabetic gastroparesis with nausea, vomiting and dehydration,      resolved.  2. Chronic left foot ulcer with possible chronic osteomyelitis.  3. Acute renal insufficiency on chronic renal insufficiency.  4. Type 1 diabetes, uncontrolled secondary to noncompliance.  5. History of posttraumatic stress disorder.  6. Hypertension.  7. Hyperlipidemia.  8. 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   DISCHARGE MEDICATIONS:  1. Doxycycline 100 mg p.o. twice daily for 18 more days (for a total      of 21 days).  2. Aspirin 81 mg p.o. daily.  3. Protonix 40 mg p.o. b.i.d.  4. Caduet 10/40 mg p.o. daily.  5. Lantus 35 units subcu q.h.s.  6. Toprol-XL 100 mg p.o. daily (this has been decreased from previous      dose of 150 mg p.o. daily).  7. Reglan 10 mg p.o. with meals and q.h.s.  8. NovoLog sliding scale carbohydrate coverage with 1 unit of NovoLog      insulin per 10 grams of carbohydrates.  9. NovoLog sliding scale as follows:  Daytime sliding scale:  For      blood sugars of 151-200 give 1 unit, 201-250 give 3 units, 251-300      give 5 units, 301-350 give 7  units, greater than 350 give 9 units.      Bedtime scale:  For blood sugars 201-250 give 2 units, 251-300 give      3 units, 301-350 give 4 units and greater than 350 give 5 units.  10.Vicodin as needed as previously prescribed.  11.Multivitamin p.o. daily.  12.Stop taking erythromycin, benazepril until further follow up with      Dr. Pedro Earls.   BRIEF HOSPITAL COURSE:  Please see full dictated history and physical  for full details of presentation and initial data.  In brief, this is a  35 year old African American male with severe uncontrolled diabetes and  multiple resulting complications who presents with intractable nausea,  vomiting and dehydration.   1. Type 1 diabetes:  Patient's blood sugars remained persistently      elevated with occasional lows throughout his hospitalization.  His      latest dose was difficult to titrate for tight control given his  noncompliance at home and lack of diabetes education.  Patient did      express willingness to learn carb counting and participate in a      sliding scale insulin regimens at home as diabetes education was      begun and patient was arranged for outpatient follow up with a      diabetes nutrition management center.  Patient's Lantus dose was      increased to 35 units subcu q.h.s. by the time of discharge, along      with a sliding scale insulin regimen as mentioned above.  Patient      will likely require further titration as an outpatient once he      receives further education and if he becomes more compliant.  2. Infectious disease:  Patient had unexplained fevers initially on      admission, thought possibly secondary to viral gastroenteritis      versus chronic osteomyelitis given his chronic left foot ulcer.      Left foot plain films were obtained showing increased flattening      and sclerosis of the base of the fifth metatarsal concerning for      chronic osteomyelitis.  Dr. Sharol Given, the patient's orthopedist and       wound care, Dr. Sherral Hammers, contacted and recommended placing the      patient on oral doxycycline for a 3 week course and outpatient      follow up with him for consideration of additional surgery.      Patient did have positive blood culture from January 04, 2007      showing gram-positive rods in the preliminary read and infectious      disease was consulted given positive blood culture and possibility      of chronic osteomyelitis.  Infectious disease initially recommended      a trial off antibiotics; however, patient promptly spiked a fever      after discontinuation of the doxycycline and thus it was restarted.      At the time of discharge, infectious disease recommended      continuation of doxycycline until urgent follow up with Dr. Dennard Schaumann      II at [facility] to followup sensitivities of cultures.  3. Hypertension:  Patient initially with elevated blood pressures,      which became much better controlled on Toprol-XL 100 mg p.o. daily      and Norvasc 10 mg p.o. daily.  Patient's ACE inhibitor was held for      acute renal insufficiency.  Would consider restarting as an      outpatient if blood pressures remain elevated.  Blood pressures, on      the day of discharge, were ranging in the 130s-140s/80s-90s.  4. Gastrointestinal:  Patient was initially admitted and given IV      fluids and his nausea and vomiting promptly improved and was not      present for several days prior to discharge.  His symptoms were      felt likely secondary to gastroparesis as this has been a common      cause of admissions in the past; however, the patient has had a      negative upper GI workup recently prior to admission, thus viral      gastroenteritis is also likely.  Patient was continued on Reglan      p.o. t.i.d. with meals and at bedtime at the time of discharge.  5.  Chronic renal insufficiency with baseline creatinine of 1.1-1.2:     Patient's has had mildly elevated creatinines  on admission, likely      secondary to prerenal ranging from 1.4-1.5.  Creatinine is 1.46 at      the time of discharge.  Patient may also have a new elevated      baseline, due to percussion of diabetic nephropathy, secondary to      noncompliance and poor control.  Would recommend further followup      as an outpatient.  Patient did undergo urinary protein      electrophoresis, which showed increased __________chains and      overall elevated total protein.  Would recommend further followup      as an outpatient as indicated.  6. Anemia:  Likely secondary to chronic disease.  Patient had elevated      haptoglobin of 475; however, this is an acute days reactant and is      nonspecific.  Patient had normal LVH, folate and B12.  Ferritin was      also elevated at 359; however, this is also an acute days reactant.      Would recommend further workup as indicated as an outpatient;      however, anemia of chronic disease is most likely.  Patient was      asymptomatic at the time of discharge.   PERTINENT LABORATORY DATA:  At the time of discharge, patient had a  basic metabolic panel, which revealed a sodium of 131, potassium of 4.9,  chloride of 97, bicarb of 27, BUN of 12, creatinine of 1.46 and glucose  of 239.  CBC, on the day prior to discharge, showed a white count of  10.4, hemoglobin of 8.4, hematocrit of 25.8 and platelets of 613.  Patient's hemoglobin A1c, this admission, was 12.4%.  Patient's urine  cultures, from January 04, 2007, were negative.  Blood cultures, from  January 04, 2007, showed no growth to date and gram-positive rods at the  preliminary read.  Will need further followup as an outpatient.  Please  send final results with sensitivities to Dr. Margaretmary Eddy, patient's  primary care physician.  Blood cultures, from January 07, 2007, showed  no growth to date x2 at the time of discharge, but will need further  followup.  Please see final results to Dr. Margaretmary Eddy at  [facility].   CONSULTATIONS:  Infectious disease.   PROCEDURES:  1. Chest x-ray from January 04, 2007 showed no acute cardiopulmonary      disease.  2. Acute abdominal series from January 05, 2007 showed no free air or      evidence of bowel obstruction.  3. Plain films of the left foot on January 05, 2007, showed increased      flattening and sclerosis of the base of the fifth metatarsal      concerning for chronic osteomyelitis.   PENDING RESULTS AND ISSUES TO BE FOLLOWED AT THE TIME OF DISCHARGE:  1. Diabetes control.  2. Followup blood cultures from both January 10 and January 07, 2007      for sensitivities and adjust antibiotics as necessary.  3. Adjust blood pressure medicines by increasing Toprol-XL to previous      dose of 150 mg daily and/or restarting patient's ACE inhibitor if      blood pressure remain elevated.  4. Recommend further monitoring of patient's creatinine as indicated     as an outpatient.  5.  Recommend further workup of anemia as indicated.   DISCHARGE INSTRUCTIONS:  Patient is to follow a carbohydrate, modified,  heart-healthy diet.  Patient has no activity restriction.  Patient is to  change left foot dressing daily.   FOLLOWUP APPOINTMENTS:  1. Patient has a followup appointment with primary care physician, Dr.      Margaretmary Eddy, at [facility] on January 17 at      9:30 a.m.  2. The patient has a followup appointment with Dr. Sharol Given his orthopedic      doctor on January 18 at 10 a.m.  3. Patient has followup appointment with nutrition diabetes management      Tuesday, January 22 at 10 a.m.  Phone number [phone].     ______________________________  Shella Maxim, M.D.    ______________________________  Arty Baumgartner. Walker Kehr, M.D.    EE/MEDQ  D:  01/08/2007  T:  01/09/2007  Job:  [id number]   cc:   Cletus Gash T. Pedro Earls, MD  Newt Minion, MD

## 2011-05-13 NOTE — Op Note (Signed)
[facility]  Patient:    MORRELL, LAO                     MRN: [id number] Proc. Date: 02/23/00 Adm. Date:  [id number] Attending:  Schuyler Amor                           Operative Report  PREOPERATIVE DIAGNOSIS:  Earleen Newport grade 4 ulcer and ischemic necrotic left forefoot.  PROCEDURE:  Left transmetatarsal amputation.  SURGEON:  Newt Minion, M.D.  ANESTHESIA:  General endotracheal.  ESTIMATED BLOOD LOSS:  Minimal.  ANTIBIOTICS:  ______ preoperatively.  TOURNIQUET TIME:  Esmarch at the ankle for approximately 20 minutes.  DISPOSITION:  To PACU in stable condition.  INDICATIONS:  Patient is a 35 year old gentleman with extremely uncontrolled diabetes, who is status post multiple toe amputations for infection, who presents at this time with ischemic, necrotic left forefoot for a left transmetatarsal amputation.  He was admitted, has been on IV antibiotics for 48 hours.  He has ood pulses and presents at this time for the above-mentioned definitive procedure. The risks and benefits were discussed, including persistent infection, need for a higher level amputation, persistent pain, nonhealing of the wound.  Patient states he understands and wishes to proceed at this time.  DESCRIPTION OF PROCEDURE:  Patient was brought to OR room 7 and underwent a general anesthetic.  After adequate level of anesthesia obtained, patients left lower extremity was scrubbed with Betadine scrub and painted with Betadine and draped  into a sterile field.  A fishmouth incision was made.  This was carried down to  periosteum.  Subperiosteal dissection was performed and a proximal metatarsal osteotomy was performed with an oscillating saw.  This was contoured cascade proximal over the lateral border.  Wound was irrigated with normal saline. There was good pink bleeding tissue from the amputated forefoot.  Cultures were obtained x 2.  The Esmarch was  released.  Hemostasis was obtained.  The wound was closed  using a far-near/near-far stitch with the wound very loosely closed.  The wound was covered with Adaptic, orthopedic sponges, sterile Webril and a loosely wrapped Coban.  The patient was extubated and taken to PACU in stable condition. DD:  02/23/00 TD:  02/23/00 Job: [id number]

## 2011-05-13 NOTE — Discharge Summary (Signed)
NAME:  [name], [name]                     ACCOUNT NO.:  [id number]   MEDICAL RECORD NO.:  [id number]                   PATIENT TYPE:  INP   LOCATION:  [id number]                                 FACILITY:  [facility]   PHYSICIAN:  Agustina Caroli, MD                  DATE OF BIRTH:  09-15-1976   DATE OF ADMISSION:  06/03/2004  DATE OF DISCHARGE:  06/07/2004                                 DISCHARGE SUMMARY   DISCHARGE DIAGNOSES:  1. Right knee prepatellar bursitis, cultures positive for methicillin-     resistant staphylococcus aureus.  2. Diabetes mellitus type 1, poorly controlled.  3. Hypertension.  4. Status post right BKA and left metatarsal amputation.   CONSULTATIONS DURING HOSPITALIZATION:  Newt Minion, M.D., orthopedics.   PROCEDURES:  1. Right prepatellar bursae aspiration.  2. A 2-view x-ray of the right knee with soft-tissue swelling.  No erosive     or destructive changes.   DISCHARGE MEDICATIONS:  1. Doxycycline 100 mg b.i.d. x14 days.  2. Toprol XL 150 mg b.i.d.  3. Norvasc 10 mg daily.  4. Lantus 40 units q.p.m.  5. NovoLog sliding scale and before meals.  6. Glucose 60-100, 0 units, 101-150 at 1 unit; 151-200 at 2 units; 201-254     at 4 units; 251-300 at 6 units; 301-350 at 8 units; and 350 or more 10     units and call M.D. for instructions.  7. Vitamin C as taken before.  8. Multivitamin daily.  9. Vicodin 5/500, q.6h. p.r.n.   DISCHARGE DIET:  Limit carbohydrates.   SPECIAL INSTRUCTIONS:  1  Do not use prosthesis until cleared by Dr. Sharol Given.  1. Use crutches.   FOLLOWUP:  1. [facility] at July 5 at 2 p.m. with Dr. Elpidio Galea.  2. Dr. Sharol Given as discussed with him.  Call for an appointment.   BRIEF HISTORY/ HOSPITAL COURSE:  [name] is a 35 year old African American  male with very-poorly controlled type 1 diabetes who presented to [facility] with a painful right knee with increased swelling, heat and  pain.  He has a positive history of methicillin-resistant staphylococcus  aureus cellulitis and was admitted for possible septic right knee and  started on empiric vancomycin and Zosyn while awaiting results of knee  aspiration.  Culture was positive for MRSA, sensitive to tetracycline.  Therefore, on the day prior to discharge, was changed to p.o. doxycycline as  above.  Dr. Sharol Given was consulted for evaluation of possible erosive joint  disease, and felt this was not the case.  Therefore, will follow as above  after a total of two to three weeks of antibiotics.  May need a bursectomy  in the future.   Diabetes mellitus type 1.  Continue poor compliance.  CBG's on home regimen  were at 160's to 180's throughout hospitalization.  Hemoglobin A1c at 12.4  demonstrates this  patient is not compliant at home.  He agrees and states he  forgets with his lifestyle.  He and fiance have contracted that he will  start on a regimen.  He was resistant to change, any further regimen, but  did agree to sliding scale.   Hypertension, very poorly control on maximization of two medications.  Poor  compliance makes hydralazine which is a q.i.d. drug a poor option.  ACE  inhibitor is not ideal in this patient with history of severe acute renal  failure, although creatinine of 1.2 during hospitalization, may consider  this in the future.  For now, better control with increase of his Toprol XL.  We will continue these and follow as an outpatient.                                                Agustina Caroli, MD    JS/MEDQ  D:  07/20/2004  T:  07/20/2004  Job:  [id number]   cc:   Newt Minion, M.D.  Fax: [phone]

## 2011-05-13 NOTE — Discharge Summary (Signed)
. [facility]  Patient:    [name], [name]                     MRN: [id number] Adm. Date:  [id number] Disc. Date: [id number] Attending:  Schuyler Amor Dictator:   Bettey Mare. Pamella Pert, M.D.                           Discharge Summary  LABORATORY DATA:  The patients hemoglobin A1C was 13.8.  BMET:  Sodium 133, potassium 3.2, chloride 95, CO2 32, glucose 335, BUN 7, creatinine 0.7, calcium  8.3.  The patient had HIV, which was nonreactive.  RPR was nonreactive.  CBC: White count initially was 20.5, hemoglobin 16.0, hematocrit 47.0, platelets 427. The patient had a urine drug screen which was positive for marijuana. Urinalysis revealed specific gravity of 1.040, pH of 6, glucose greater than 1000, a small  amount of hemoglobin, negative bilirubin, greater than 80 ketones, 30 protein, .2 urobilinogen, negative nitrites, negative leukocyte esterase, 0-5 white blood cells, 0-5 red blood cells, few bacteria.  Blood cultures were no growth over hospital course.  Urine culture was also no growth.  Gram smear of wound culture on the left side:  No white blood cells, moderate gram-positive cocci in pairs with moderate gram-positive rods, a few gram-negative rods.  Culture revealed multiple organisms present but none predominant.  Culture on the right wound also revealed multiple organisms present but none predominant. DD:  02/24/00 TD:  02/24/00 Job: [id number]

## 2011-05-13 NOTE — H&P (Signed)
NAME:  [name], [name]                     ACCOUNT NO.:  [id number]   MEDICAL RECORD NO.:  [id number]                   PATIENT TYPE:  INP   LOCATION:  6709                                 FACILITY:  [facility]   PHYSICIAN:  Billey Chang, M.D.                  DATE OF BIRTH:  1976/01/15   DATE OF ADMISSION:  01/06/2003  DATE OF DISCHARGE:                                HISTORY & PHYSICAL   CHIEF COMPLAINT:  Sore on leg.   HISTORY OF PRESENT ILLNESS:  The patient is a 35 year old male with poorly  controlled type 1 diabetes, who noticed a sore on his right leg upon  awakening one morning approximately four days prior to admission.  Though he  did not see one, he thinks that a spider may have bitten him.  He noticed  the site has become increasingly warm and today had become painful and now  with purulent discharge.  He noticed specifically on the way to the  emergency room that three different lesions popped up that additionally  have some yellowish color to them.  He admits to subjective fever.  The  patient has notable past history for multiple hospitalizations for BKA and  infections that have resulted in an amputation.  He admits that his blood  sugars have not been well-controlled and says that the last one that he had  checked was 340.  He says he does not have test strips and admits to taking  his insulin generally once a day, five days out of the week.   REVIEW OF SYSTEMS:  Review of systems significant for feeling hot but no  chills or headaches.  He has lightheadedness.  CARDIOVASCULAR:  Denies chest  pain.  RESPIRATORY:  He denies shortness of breath or cough.  GI:  No  nausea, vomiting or diarrhea.  SKIN:  As per HPI.  NEUROLOGIC:  Noncontributory.  PSYCHIATRIC:  He has been treated for depression in the  past but says he has not felt down or had any crying spells.  MUSCULOSKELETAL:  Denies any weakness.  EYES:  Has had double vision for a  while.  HEENT:  Denies any  rhinorrhea.  GENITOURINARY:  Admits to frequency  but denies any dysuria or hematuria.   PAST MEDICAL HISTORY:  The patient has had diabetes, type 1, for  approximately 10 years.  His last hemoglobin A1c on record was 10.4 in  December of 2002.  He has had multiple hospitalizations for DKA.  He has a  history of major depressive disorder and post-traumatic stress disorder.  He  has a history of impotence.   PAST SURGICAL HISTORY:  Past surgical history notable for a right BKA in  2002.   MEDICINES:  1. Altace 5 mg p.o. q.a.m., 5 mg p.o. q.p.m.  2. Insulin 70/30 -- 45 units in a.m., 45 units in p.m.  3. Denies taking Paxil but says has not  taken in a while.  4. Viagra 50 mg p.r.n. -- has not taken in greater than 48 hours.  5. New hypertension pill but does not recall the names, takes daily.  6. Vicodin, which he said he has not used in a while.   ALLERGIES:  No known drug allergies.   SOCIAL HISTORY:  The patient is noted to have used THC and smoking in the  past but denies any current.  He denies alcohol use.  The patient has a poor  social situation in which he has extreme difficulty in obtaining  transportation to health care.  He has health insurance through [employer] and  Medicare.  Denies any HIV risk factors to include having a monogamous  partner and denies IV drug use.   FAMILY HISTORY:  Family history significant for hypertension, diabetes and a  CVA in a grandmother.   PHYSICAL EXAMINATION:  VITAL SIGNS:  Temperature 100.5, pulse 103,  respirations 18, blood pressure 183/101, oxygen saturation of 96% on room  air.  GENERAL:  In general, the patient is thin but well-appearing, and appears to  be in some discomfort secondary to pain.  HEENT:  Within normal.  RESPIRATORY:  Lungs are clear to auscultation bilaterally without any  crackles, rubs or wheezes.  CARDIOVASCULAR:  Heart is regular rate and rhythm without murmurs, rubs, or  gallops.  GI:  Abdomen is benign,  nontender, nondistended with normoactive bowel  sounds.  SKIN:  On the anterior surface of the right leg, there are multiple lesions,  most notably one that has a central area of black necrosis surrounded with a  white area which has many further surrounding areas of erythema and edema  (this is where the patient thinks a spider bit him).  Lower down this leg,  close to the amputation site, there are multiple areas that appear to be  bullous with either clear or purulent contents to them -- these are also  very tender -- and there are additionally nontender ulcers to the dependent  area of the right amputation site and to the left foot that are without  purulence and do not appear to have contact with bone and appear to have  good granulation tissue.  LYMPHATIC:  There is bilateral groin adenopathy, more significant on the  right than left.   LABORATORY DATA:  WBC 13.5 with 78% neutrophils; hemoglobin is 12.4.  Electrolytes:  Sodium 126, potassium 3.7, chloride 87, bicarb 28, BUN 11,  creatinine 1.1 and blood sugar 707, (anion gap is 11).   ASSESSMENT AND PLAN:  This is a 35 year old male with a history of poorly  controlled type 1 diabetes with new skin lesions.   1. Cellulitis versus possible spider bite:  We will treat empirically for     skin ____ in a diabetic with Ancef.  We will provide Vicodin for pain     control.  We will check blood cultures and follow white blood cell count.     The patient does not have any clear human immunodeficiency virus risk     factors.  We will check an human immunodeficiency virus.  2. Type 2 diabetes:  We will start with insulin 70/30 at 30 units b.i.d.     with sliding-scale insulin and we will titrate up.  We will check a     hemoglobin A1c, though have concern that his diabetes is poorly     controlled with the blood sugar of 707 on admission with no evidence for  diabetic ketoacidosis.  We will consult wound care, diabetes consult and      social work to assist with medicines and supplies.  Discussed with the     patient how detrimental uncontrolled diabetes can be on the rest of the     body to include risk of possible kidney failure in the future.  To     continue diabetic teaching throughout the hospitalization.  3. Fluids, electrolytes, and nutrition:  There is no evidence of diabetic     ketoacidosis with a normal anion gap, however, we will use aggressive     intravenous fluid bolus with 2 L of normal saline and then maintenance     fluid and will continue to follow.  Provide insulin as needed.  4. Hypertension:  We will continue Altace and add hydrochlorothiazide and     will continue to follow blood pressures.     Harmon Pier, M.D.                      Billey Chang, M.D.   CH/MEDQ  D:  01/07/2003  T:  01/07/2003  Job:  [id number]   cc:   Remo Lipps A. Lavella Hammock, M.D.  Fam. Med - Resident - [place], [place] [address]  Fax: [phone]

## 2011-05-13 NOTE — H&P (Signed)
NAME:  [name], [name]                     ACCOUNT NO.:  [id number]   MEDICAL RECORD NO.:  [id number]                   PATIENT TYPE:  EMS   LOCATION:  MINO                                 FACILITY:  [facility]   PHYSICIAN:  Karmen Bongo, M.D.                DATE OF BIRTH:  June 26, 1976   DATE OF ADMISSION:  09/15/2002  DATE OF DISCHARGE:  09/15/2002                                HISTORY & PHYSICAL   CHIEF COMPLAINT:  Right below-knee amputation stump bleeding.   HISTORY OF PRESENT ILLNESS:  The patient is a 35 year old African American  male of Dr. Josephina Shih of the family practice center who presents with a  complaint of right below-knee amputation with edema.  He had made an  appointment for today but when he removed his prosthesis this morning found  that his stump was bleeding.  He did note that he injured it earlier this  week.  His only complaints today are that on Friday he developed decreased  p.o. and increased sleeping over the weekend.  He was seen Sunday in the  emergency room and sent home.   REVIEW OF SYSTEMS:  Positive for weight loss and palpitations as well as  diplopia and feeling sad all the time.  Negative for chest pain, shortness  of breath, cough, nausea, vomiting, diarrhea, rashes, weakness, numbness,  tingling, suicidal ideation, homicidal ideation, tinnitus or dysuria.   PAST MEDICAL HISTORY:  Significant for diabetes mellitus type 1 for  approximately ten years with multiple episodes of hospitalizations secondary  to diabetic ketoacidosis.  The patient has a history of medical  noncompliance as well.  Also, other diagnoses are major depressive disorder  and post-traumatic stress disorder.   CURRENT MEDICATIONS:  1. Altace 10 mg p.o. q.a.m. and 5 mg p.o. at 2 p.m.  2. 70/30 insulin, 40 units q.a.m. and 35 units q.p.m.  3. Paxil 20 mg p.o. q.d.  4. Tylox one tablet p.o. q.6h. p.r.n. pain.   ALLERGIES:  No known drug allergies.   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  He also has a remote left mid foot amputation.   SOCIAL HISTORY:  The patient acknowledges marijuana use and states his last  use was last Wednesday.  He denies smoking or alcohol or other drugs.  The  patient has a history of homelessness but currently is living with his  grandmother and states that he will be moving to a place of his own at the  end of the month.   PAST FAMILY HISTORY:  Significant for hypertension, diabetes and  cerebrovascular accident in a grandmother.   PHYSICAL EXAMINATION:  VITAL SIGNS:  Afebrile, pulse 87, blood pressure  122/65, respirations 16, saturations of 99% on room air.  GENERAL:  The patient is somewhat blunted in his affect but is generally  conversant and in no acute distress.  Of note,  his CBG just prior to my  discussion with him was 45, so some of his blunting may be secondary to  hypoglycemia.  HEENT:  Pupils equal, round and reactive to light and accommodation.  Extraocular muscles intact.  Very poor dentition.  NECK:  Supple, no lymphadenopathy.  CARDIOVASCULAR:  Regular rate and rhythm without murmur.  LUNGS:  Clear to auscultation bilaterally.  ABDOMEN:  Soft, nontender and nondistended.  MUSCULOSKELETAL:  His right below-knee amputation stump does have an  approximately 2 cm laceration which is draining frank pus.  His left mid  foot amputation is well healed with no evidence of ulcers.  NEUROLOGIC:  Grossly intact.   LABORATORY DATA:  White blood cell count is 11.7 with 69% neutrophils, 13%  lymphocytes, hemoglobin 12.4, platelet count 319,000.  Sodium is 132,  potassium 2.9, chloride 95, CO2 29, BUN 10, creatinine 0.9, glucose 295,  calcium 8.5.  Sedimentation rate is 85.  CRP is pending.  Knee x-ray shows  below-knee amputation without any acute abnormality.   ASSESSMENT AND PLAN:  1. Orthopedics.  Per discussion with Dr. Sharol Given he will admit the patient  to     the orthopedics floor and start IV antibiotics.  The patient has     cellulitis versus an early osteomyelitis, and may need incision and     drainage.  He will be given Vicodin for pain.  2. Diabetes.  Rehydrate with 2 liters of normal saline and resume home     insulin, medications, and sliding scale insulin, and titrate accordingly.     We will check a hemoglobin A1C and CBGs q.a.c. and q.h.s.  Additionally,     the patient does need potassium repletion given the fact that the     teaching service will continue consultation for diabetes care.  3. Social.  The patient does have a history of drug abuse and medical     noncompliance.  He is very unkempt appearing.  We will consult social     work for assistance in managing this patient.                                               Karmen Bongo, M.D.    Stephenie Acres  D:  09/19/2002  T:  09/21/2002  Job:  [id number]

## 2011-05-13 NOTE — Consult Note (Signed)
[name], [name]           ACCOUNT NO.:  [id number]   MEDICAL RECORD NO.:  [id number]          PATIENT TYPE:  OBV   LOCATION:  5009                         FACILITY:  [facility]   PHYSICIAN:  Mark C. Lorin [name], M.D.    DATE OF BIRTH:  December 24, 1976   DATE OF CONSULTATION:  DATE OF DISCHARGE:                                   CONSULTATION   REQUESTING PHYSICIAN:  Wandra Feinstein, M.D.   REASON FOR CONSULTATION:  Diabetic with left foot abscess.   This is a 36 year old male who is status post transplant several years ago  and status post right BKA by Dr. Sharol Given, had treatment of the ________________  by Dr. Sharol Given a few days go.  He has been feeling poorly, had not been taking  his insulin since he is not eating well and came in with sugars in the 400s.  He has a fluctuant mass laterally which was tense lateral side of his foot.  Hyperglycemia, increased white count and temp and other lab abnormalities  all consisted with infected foot.  X-rays are negative for osteomyelitis.  Lateral incision made after sterile prepping with sterile gloves with the  foot hanging over the bed and abscess opened with purulent and bloody fluid  obtained under pressure.  Aerobic and anaerobic cultures obtained, labeled  the left diabetic foot.  I did scan over abscess unroofed removing an area  of about 3 x 3 cm.  Acrylic packing placed in the tip and then foot wrapped.  He will need pulsatile lavage therapy and observation.  I have discussed  with him that if infection progresses up his leg, then he might require bony  amputation, however at this point, hopefully we can treat this with dressing  changes, etc.  The importance of taking his insulin and control of his  diabetes were discussed with the patient in order to fight the infection.  Will follow in Dr. Jess Barters absence this week.   Thank you for the opportunity to see him in consultation.  Beeper number [id number]  [id number].      Mark C. Lorin [name], M.D.  Electronically Signed     MCY/MEDQ  D:  07/24/2006  T:  07/25/2006  Job:  [id number]   cc:   Verner Chol, MD

## 2011-05-13 NOTE — H&P (Signed)
NAMEBROADUS, POUND NO.:  [id number]   MEDICAL RECORD NO.:  [id number]          PATIENT TYPE:  INP   LOCATION:  5732                         FACILITY:  [facility]   PHYSICIAN:  Cletus Gash T. Pedro Earls, MDDATE OF BIRTH:  1976-07-04   DATE OF ADMISSION:  01/04/2007  DATE OF DISCHARGE:                              HISTORY & PHYSICAL   PRIMARY CARE PHYSICIAN:  Margaretmary Eddy, M.D., at the [facility].   CHIEF COMPLAINT:  Nausea, vomiting, and fever.   HISTORY OF PRESENT ILLNESS:  Patient is a 35 year old African American  male with a history of insulin-dependent diabetes mellitus that is  poorly controlled secondary to noncompliance due to finances, as well as  other contributing factors (last A1c is 10.6) who presents with 10-plus  days of nausea, vomiting, and inability to take p.o.  He was seen in  clinic on January 7th where we initiated a workup for gastroparesis.  He  had a normal upper GI series, barium swallow.  He was started  empirically on erythromycin with modest improvement.  He had no nausea  and vomiting on January 9th.  However, today he has had emesis 6-7  times; it is tea colored, nonbilious, and he is also having a fever to  101 degrees, so he came to the emergency department.   REVIEW OF SYSTEMS:  GENERAL:  Positive for fever, headache and malaise.  CARDIOVASCULAR:  Negative for chest pain and pleuritic pain.  PULMONARY:  Negative for cough, shortness of breath and wheezing.  GI:  Positive for  nausea, vomiting.  No diarrhea or melena. There is no melena,  hematochezia and he denies any abdominal pain.  GU:  Negative for  dysuria.  Negative for discharge.  He is not currently sexually active  and denies any sexual partners.  SKIN:  Shows a chronic ulcer on the  base of the foot.   PAST MEDICAL HISTORY:  1. Diabetes mellitus type 1.  2. Diabetic ketoacidosis.  3. Depression.  4. History of posttraumatic stress disorder.  5.  Chronic skin ulcer.  6. Hypertension.  7. Hyperlipidemia.  8. He is H. pylori negative on January 7th.   OTHER PAST MEDICAL HISTORY:  Includes:  1. Hospitalized for a left foot abscess in August of 2007.  2. He had a long hospitalization in the past in an attempt to save his      right foot or leg from amputation, but was unsuccessful.   MEDICATIONS:  What he is suppose to be taking is:  1. Aspirin 81 mg p.o. daily.  2. Benazepril 40 mg p.o. daily.  3. Erythromycin 250 mg p.o. t.i.d.  4. Lantus 60 units q.h.s. and 10 units q.a.m.  5. Lipitor 40 daily.  6. Norvasc 10 daily.  7. Protonix 40 mg p.o. b.i.d.  8. Reglan 10 mg p.o. q.a.c. h.s.  9. Toprol-XL 150 mg p.o. daily.  10.Vicodin p.r.n.   HE HAS NO KNOWN DRUG ALLERGIES.   PREVIOUS PROCEDURES:  Include:  1. A right BKA in 2002.  2. A left foot amputation in 2007.   SOCIAL HISTORY:  Patient does admit to marijuana, smoking a pack and a  half a day, occasional crack and occasional cocaine abuse.  He denies  any alcohol.  He has a very tenuous home situation.  In the past, he has  been homeless.   FAMILY HISTORY:  Significant for hypertension, diabetes and a CVA in his  grandmother.   PHYSICAL EXAMINATION:  VITAL SIGNS:  Temp is 101.3.  Pulse is 90.  Blood  pressure 166/64.  Respiratory rate is 22.  SPO2 is 100% on room air.  GENERAL APPEARANCE:  No apparent distress.  Alert and oriented x3.  Nontoxic.  Approximately 10% dehydration, with dry mucous membranes.  MENTAL STATUS:  Normal insight and judgment.  HEENT/NECK:  Atraumatic, normocephalic.  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  Pupils equally round and  reactive to light.  Extraocular movements are intact.  TMs are obscured  by cerumen.  There is mild slight erythema in the posterior oropharynx  secondary to his vomiting.  He does have upper dentures.  LUNGS:  Clear to auscultation bilaterally.  No wheezes, crackles or  rales.  HEART:  Regular  rate and rhythm.  No murmurs, rubs, or gallops.  There  is no evidence of endocarditis.  There are no Osler nodes.  There are no  Janeway lesions.  There is no raw spots.  ABDOMEN:  Soft, nondistended, nontender.  Positive bowel sounds.  No  guarding.  No rebound.  EXTREMITIES:  Right BKA, left foot amputation, there is a chronic ulcer  at the base of the left foot that is 4 cm x 2 cm with no active signs of  acute infection.  GENITAL/RECTAL EXAM:  Deferred.  NEUROLOGIC EXAM:  Cranial nerves II-XII grossly intact.  Muscle strength  5/5 equal and symmetric in the upper extremities with normal reflexes.  SKIN:  Shows a 4 cm x 2 cm chronic left foot ulcer, but no other rashes,  petechiae or purpura.  There is no  lymphadenopathy.   LAB TESTS:  Urinalysis shows a specific gravity of 1.028 with greater  than 1000 glucose, 40 ketones, large blood, greater than 300 protein,  negative nitrites, negative leukocyte esterase, 11-20 red blood cells on  microscopy, 3-6 white blood cells microscopy, few bacteria and a fatty  cath.  Chest x-ray shows no acute disease.  CBC shows a white count of  9.6, hemoglobin of 8.8, hematocrit of 26.5, platelet count of 569, 89%  PMNs.  MCV is 82.9.  I-stat 8 shows a sodium of 128, chloride of 9.6,  potassium of 4.1, bicarb of 26, BUN of 14, creatinine of 1.9, glucose of  346.  Of note, he had an upper GI series on the 8th of January that  showed normal with no evidence of gastroparesis.   ASSESSMENT/PLAN:  A 35 year old [place] American male.  1. Fever.  This is an unknown source.  Portable chest x-ray is clear.      We will check blood cultures x2, urine culture, urinalysis, urine      Gram stain.  He is in no abdominal pain; however, if he develops I      will consider an abdominal CT to evaluate for abscess versus      possible nephrolithiasis with some urinary retention given his      hematuria.  Also, given his chronic nonhealing ulcer on the base of      the left foot, we will check a left lower extremity x-ray to  evaluate for osteomyelitis.  We will hold antibiotics at present.      Differential diagnoses includes viral infection, gastroenteritis,      occult bacteremia, surgery reaction, kidney stone with a      postobstructive urinary tract infection, osteomyelitis, sexually      transmitted disease, we will followup initial studies and order      subsequent studies if indicated.  2. Nausea and vomiting.  Upper GI was negative for gastroparesis.  We      will check a CMP and lipase.  We will continue the Reglan.  We will      begin vigorous IV fluid rehydration and use p.r.n. Phenergan.  We      will hold erythromycin given the upper GI series finding.  We will      also check an acute abdominal series to rule out ileus and small-      bowel obstruction.  We will start a clear liquid diet and advance      his diet as tolerated.  3. Insulin-dependent diabetes mellitus.  This is poorly controlled,      with his last hemoglobin A1c being 10.6; we will begin Lantus 30      units subcu b.i.d. and cover with sliding scale insulin and advance      his insulin aggressively based on his sliding scale insulin usage.      I am afraid to start too high dose of his Lantus as the patient has      been noncompliant in the past and I am afraid to start a full dose      insulin that he is suppose to be taking without seeing his reaction      to the 30 units b.i.d.  4. Acute renal failure.  His creatinine is 1.9; this is likely      prerenal.  We will bolus with 2 liters normal saline and start half      normal saline with 20 mEq of KCl per liter, 125 mL an hour.  I      doubt that there is a kidney stone with an obstruction causing his      acute renal failure, given the fact that he is pain free.  I will      check a urine sodium and urine creatinine, calculate his FENA and      collect a 24-hour urine protein to check for nephrotic range       proteinuria.  5. Hematuria.  There is large blood on his urinalysis.  I will send a      urine gram-stain and urine culture.  The most common causes for his      age include infection, sexually transmitted disease,      nephrolithiasis (I am getting a KUB), exercise (rhabdo); however,      the red blood cells on his microscopy show this is unlikely      prostatitis, bladder/urethra/renal cancer.  I will rehydrate and      repeat a urinalysis.  6. Anemia.  His previous hemoglobin was 9.1 with a normal MCV.  Back      in July of 2007, he had a normal ferritin.  Today he is 8.8 with a      normal MCV and a normal RDW.  I will check a ferritin, B12, folate,      guaiac his stool, Gastroccult his emesis, check an LDH, check a      heptoglobin.  I suspect this is anemia of chronic disease secondary      to diabetes mellitus and possibly osteomyelitis.  7. Hypertension.  We will hold his ACE inhibitor due to his acute      renal failure.  Continue his Toprol 150 mg p.o. daily.  I will hold     his Norvasc, as I think he is probably not taking it at home.  I      will continue rehydrating and follow his blood pressure.  Once he      is rehydrated, I will definitely resume an ACE inhibitor given his      significant proteinuria.  8. Hyperlipidemia.  Question of whether or not he is taking Lipitor 80      mg p.o. daily.  We will check a fasting lipid panel.  9. Prophylaxis.  Begin Protonix 40 mg p.o. daily and place an      sequential compression device on his left leg while he is in bed.  10.Drug abuse.  The patient does admit to using cocaine and crack,      along with marijuana and tobacco.  Given the fact that he is using      cocaine and crack, I will not resume his Toprol-XL, and I will do      rehydration right now and then gradually start medicines after he      has been rehydrated.      Cletus Gash T. Pedro Earls, MD     WTP/MEDQ  D:  01/04/2007  T:  01/05/2007  Job:  [id number]

## 2011-05-13 NOTE — Discharge Summary (Signed)
NAME:  [name], [name]                     ACCOUNT NO.:  [id number]   MEDICAL RECORD NO.:  [id number]                   PATIENT TYPE:  INP   LOCATION:  5033                                 FACILITY:  [facility]   PHYSICIAN:  Noelle C. Alford Highland, M.D.           DATE OF BIRTH:  1976/07/10   DATE OF ADMISSION:  09/19/2003  DATE OF DISCHARGE:  09/27/2003                                 DISCHARGE SUMMARY   DISCHARGE MEDICATIONS:  1. Clindamycin 300 mg p.o. q.i.d. until told to stop by his doctors.  2. Lantus insulin 60 units subcu q.h.s.  3. Sliding scale insulin as usual.  4. Toprol XL 100 mg p.o. q.a.m. The patient is to take this instead of his     Altace until his kidney function returns to normal.  5. Robitussin AC cough syrup 10 ml p.o. q.4h. p.r.n. cough.  6. Vicodin 5/500 mg 1 p.o. q.6h. p.r.n. pain.   DISCHARGE INSTRUCTIONS:  The patient was told to use crutches and provided  to him and not to bear weight on his left foot until told to start this by  the orthopedist. Diabetic diet.   DISCHARGE FOLLOW UP:  1. Family practice center Monday morning, September 29, 2003.  The patient is     to be seen as a work in and have his BNP and blood pressure checked.  2. Orthopedic surgery as directed.   DISCHARGE DIAGNOSES:  1. Left foot abscess status post debridement.  2. Type 1 diabetes.  3. Hypertension.  4. Acute renal insufficiency.  5. Upper respiratory infection.  6. Nausea and vomiting, possible gastroparesis.   INSTRUCTIONS FOR THE PRIMARY DOCTOR:  Please note that due to the patient's  acute renal insufficiency his Altace was stopped and he was placed on Toprol  XL instead for blood pressure control. Control at the time of discharge was  still not ideal, so a second medication such as Norvasc may need to be  added. Please also note that the infectious disease consult during admission  felt that he would likely need a long duration of oral antibiotic therapy  for his foot  infection, but no distinct time line was given.   CONSULTATIONS:  1. Dr. Lorin [name] in orthopedics.  2. Infectious disease, Dr. Johnnye Sima.   PROCEDURES:  1. Surgical debridement of left foot ulcer by Dr. Lorin [name] on September 21, 2003.  2. Chest CT negative for pulmonary embolus on September 22, 2003.  3. MRI of the left foot showed diffuse cellulitis, no definite     osteomyelitis, but positive bone marrow edema on September 20, 2003.  4. A 2-D echocardiogram on September 23, 2003 showed a normal ejection     fraction at 55-65% and no vegetations. The study was thought to have good     sensitivity for this.   HOSPITAL COURSE:  This is a 35 year old African-American male well-known to  the family practice teaching service who  presented on September 19, 2003  with left foot stump abscess that had been worsening over the last few  weeks.   1. Left foot abscess. The patient underwent surgical debridement of this on     September 21, 2003. This foot has already undergone a partial amputation     to the level of the metatarsals. The patient tolerated this surgery well.     Surgical cultures did show rare methicillin resistant staphylococcus     aureus, which were susceptible to Tetracycline, Clindamycin, and Bactrim.     He was treated with seven days total IV Vancomycin after his surgery. By     the time of discharge he was having minimal pain in his foot and had been     evaluated by physical therapy who felt that he was appropriate to go     home. By postoperative day number 6 the patient strongly requested     discharge due to personal issues and said that he needed to get home. He     had been cleared for discharge by orthopedics from their standpoint. He     was discharged on antibiotics as above and will continue dressing changes     per orthopedics instructions. He is to follow up with orthopedics two     days after discharge. An echocardiogram during hospitalization also ruled      out endocarditis. Of note, blood cultures during hospitalization were     negative. The surgical wound culture was also sensitive to Vancomycin and     Rifampin.  2. Type 1 diabetes. Good control maintained during hospitalization with     Lantus and sliding scale insulin as well as diabetic diet.  3. Hypertension. The patient's blood pressures were difficult to control     during the hospitalization and ran as high as the 200s/110s. Due to renal     issues he was changed from his usual Altace to a beta blocker as above.     However, even after starting this medicine the patient's blood pressures     were still running in the 180s/100s. He was advised that his blood     pressures were elevated at discharge, but still wished to go home.  4. Acute renal failure. The patient's baseline creatinine seems to run about     0.7 to 0.9. However, during the hospitalization his creatinines bump to     the highest of 2.7. On the day of discharge creatinine was 2.3 with a BUN     of 7.0, felt to be likely secondary to Vancomycin. He had no symptoms     consistent with uremia and had excellent urine output at the time of     discharge. He was advised that it was very dangerous to go home with     acute renal insufficiency and was at risk for multiple complications, but     he still chose discharge. He should be restarted on his ACE inhibitor     when his renal insufficiency resolves. He will come to the family     practice center in two days to have this checked.  5. Upper respiratory infection. The patient continues to have low grade     fevers a few days after his surgical debridement. He complained of     significant rhinorrhea with nasal congestion, but denied any shortness of     breath, abdominal pain, dysuria. He did have a dry cough as well, which  responded very well to Robitussin cough syrup, which he will be    discharged on. He was afebrile on the day of discharge.  6. Nausea and vomiting.  The patient had a few days of nausea and vomiting     thought to be likely due to gastroparesis. He was treated briefly with     Reglan and Phenergan, but this issue had resolved with no nausea at all     on the two days prior to discharge. He may need to be restarted on these     medicines as an outpatient if this problem continues.                                                Noelle C. Alford Highland, M.D.    NCR/MEDQ  D:  09/27/2003  T:  09/29/2003  Job:  [id number]   cc:   Remo Lipps A. Lavella Hammock, M.D.  F[facility] - Resident - [place], [place] [address]  Fax: [phone]   Thana Farr. Lorin [name], M.D.  [address]  [place], [place] [address]  Fax: [place]. Johnnye Sima, M.D.  [place]  [place] [address]  Fax: [phone]

## 2011-05-13 NOTE — Op Note (Signed)
[place]  Patient:    [name], [name]                     MRN: [id number] Proc. Date: 06/29/00 Adm. Date:  [id number] Disc. Date: [id number] Attending:  McDiarmid, Blane Ohara.                           Operative Report  PREOPERATIVE DIAGNOSIS:  A large soft tissue wounds, status post VAC treatment, right lower extremity, medial and lateral aspects of his leg.  POSTOPERATIVE DIAGNOSIS:  A large soft tissue wounds, status post back treatment, right lower extremity, medial and lateral aspects of his leg.  PROCEDURES:  Split-thickness skin graft medial and lateral right foot and right leg wounds.  SURGEON:  Newt Minion, M.D.  ANESTHESIA:  LMA.  ESTIMATED BLOOD LOSS:  minimal.  DISPOSITION:  To PACU in stable condition.  INDICATIONS:  Patient is a 35 year old gentleman, who had massive infection of his foot extending all the way up to his leg.  He has undergone multiple irrigation and debridements, as well as, treatment with VAC for granulation and closure of the wounds.  He has granulated well, no evidence of infection at this time and presents at this time for a split-thickness skin graft.  The risks and benefits were discussed, including infection, nonhealing of the graft, need for a below the knee amputation.  Patient states, he understands and wished to proceed at this time.  DESCRIPTION OF PROCEDURE:  Patient was brought to OR room 14 and underwent an LMA general anesthetic.  After adequate level of anesthesia obtained, patients right lower extremity was prepped using Betadine and draped into a sterile field.  Attention was first focused for harvesting of skin graft.  The Zimmer dermatome was used set at 0.016.  Oil was applied to the skin and a 3 inch wide skin graft was obtained by about 6 inches in length.  Lidocaine and epinephrine soaked sponge was applied to the harvest site.  The graft was then meshed 1:1.5.  The tissue bed had  good granulation tissue and the split-thickness skin graft was applied and affixed with staples.  The wound was covered with Adaptic, Bactroban cream, both medial and lateral wounds, 4 x 4s, Kerlix and a Coban dressing.  The proximal harvest site was covered with Scarlet Red and an OpSite.  The patient was extubated and taken to PACU in stable condition.  Plan to follow up in five days in the foot clinic. DD:  06/29/00 TD:  06/29/00 Job: [id number]

## 2011-05-13 NOTE — Op Note (Signed)
[place]  Patient:    [name], [name]                     MRN: [id number] Proc. Date: 05/23/00 Adm. Date:  [id number] Disc. Date: [id number] Attending:  McDiarmid, Blane Ohara.                           Operative Report  PREOPERATIVE DIAGNOSIS:  Abscess of right lower extremity, foot and gastrocnemius muscle.  POSTOPERATIVE DIAGNOSIS:  Infection from the patients right calcaneus through the tarsal tunnel to the proximal medial head of the gastrocnemius muscles.  OPERATION PERFORMED:  Irrigation and debridement from the foot tarsal tunnel to the proximal gastronemius with pulse lavage.  Cultures x 4.  SURGEON:  Newt Minion, M.D.  ANESTHESIA:  General endotracheal.  ESTIMATED BLOOD LOSS:  Minimal.  Wound left open.  DISPOSITION:  To PACU in stable condition.  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  The patient was initially consulted, underwent an irrigation and debridement of the calcaneal ulcer.  He was started on soaks and antibiotics.  This progressed to healing.  The patient developed increasing blistering around the heelcord.  This was debrided at the bedside on May 28. The patient had increasing swelling proximally.  He underwent an ultrasound which showed no evidence of deep vein thrombosis.  Bone scan was negative for osteo but an MRI scan did show an abscess from the tarsal tunnel up to the medial head of the gastroc.  The patient was emergently scheduled for surgical intervention.  The risks and benefits were discussed with the patient including persistent infection, most likely need for below-knee amputation. The patient states he understands and wants to proceed at this  time.  DESCRIPTION OF PROCEDURE:  The patient was brought to the operating room 14 and underwent general endotracheal anesthetic.  After an adequate level of anesthesia was obtained, the patients right lower extremity was prepped using Betadine paint and draped into a sterile field.  An incision was made from the right midfoot extending proximally along the tarsal tunnel along the medial gastrocnemius muscle.  The gastroc muscle fascia was split and a large purulent abscess was encountered that was along the medial head of the gastrocnemius muscle.  This was cultured.  This was dissected distally all the way down to the tarsal tunnel and abscess extended all the way around the calcaneus posteriorly and plantarly.  The heel ulcer was debrided.  This was cleansed with pulse lavage.  There was no necrotic muscle.  The wound was wicked open with Kerlix soaked in saline, wrapped in dry Kerlix and loosely wrapped ace.  The patient was extubated and taken to PACU in stable condition. Total cultures obtained were 4.  Plan for pulse lavage, dressing changes twice a day and once this starts resolving, most likely will require below-knee amputation.  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 DD:  05/23/00 TD:  05/25/00 Job: [id number]

## 2011-05-13 NOTE — Discharge Summary (Signed)
NAME:  [name], [name]                     ACCOUNT NO.:  [id number]   MEDICAL RECORD NO.:  [id number]                   PATIENT TYPE:  EMS   LOCATION:  ED                                   FACILITY:  [facility]   PHYSICIAN:  Wolfgang Phoenix. Fields, M.D.                DATE OF BIRTH:  [birth year]   DATE OF ADMISSION:  06/04/2003  DATE OF DISCHARGE:  06/05/2003                                 DISCHARGE SUMMARY   DISCHARGE MEDICATIONS:  1. Doxycycline 100 mg one pill every morning and every evening for 10 days.  2. Hydrochlorothiazide 25 mg one pill every day for high blood pressure.  3. Altace 20 mg one pill daily.  4. Atenolol 50 mg one pill every morning and every evening.  5. Insulin Lantus 60 units every morning.  6. Lasix 40 mg one pill every day.  7. Multivitamins daily.   DISCHARGE DIAGNOSES:  1. Viral gastritis.  2. Hidradenitis suppurativa.  3. Hypertension.  4. Insulin-dependent diabetes mellitus.   PRIMARY MEDICAL DOCTOR:  Loura Back. Lavella Hammock, M.D.   HISTORY OF PRESENT ILLNESS:  This is a 35 year old African American male  with IDDM, hypertension, status post right BKA, and presenting with  abdominal pain, nausea, vomiting, and right axillary cellulitis/mass lesion  postop.   PROBLEM LIST:  1. Viral gastritis.  CT of abdomen negative.  Lipase within normal limits.     Cardiac enzymes within normal limits.  Urine drug screen positive for     opioids and cannabinoids.  Nausea and vomiting controlled with Compazine     and Reglan (possibly gastroparesis).  Hydrated with IV fluids with diet     gradually advanced as tolerated.  2. Right hidradenitis suppurativa.  The area was erythematous and indurated.     Leukocytosis at 14 on admission.  A 1 mm pinpoint incision made at     pedunculated area to allow drainage using sterile technique.  Given Ancef     IV initially and changed over to doxycycline x 10 days.  3. Hypertension.  The patient was managed on hydrochlorothiazide and   atenolol.  4. Diabetes.  Patient on insulin Lantus 6 units daily.   DISCHARGE INSTRUCTIONS:  1. Vicodin as directed previously.  2. Diabetic diet.  3. Keep foot ulcer clean.  Cover as directed by orthopedist.  4. Keep the right axillary region clean with water/mild soap.  5. Return to ER or your primary care [name] if you develop severe chest     pain, difficulty breathing, severe abdominal pain, uncontrollable     vomiting, or diarrhea.    FOLLOWUP:  Follow-up appointment with Dr. Lavella Hammock at the [facility] on June 23, 2003, at 10:30 a.m.     Amit A. Posey Pronto, M.S.-IV                    Wolfgang Phoenix Fields, M.D.    AAP/MEDQ  D:  06/08/2003  T:  06/08/2003  Job:  [id number]   cc:   Remo Lipps A. Lavella Hammock, M.D.  Fam. Med - Resident - [place], [place] [address]  Fax: [phone]

## 2011-05-13 NOTE — Discharge Summary (Signed)
[name], [name] NO.:  [id number]   MEDICAL RECORD NO.:  [id number]          PATIENT TYPE:  INP   LOCATION:  2024                         FACILITY:  [place]   PHYSICIAN:  Talbert Cage, M.D.DATE OF BIRTH:  04/25/1976   DATE OF ADMISSION:  10/06/2004  DATE OF DISCHARGE:  10/06/2004                                 DISCHARGE SUMMARY   DISCHARGE DIAGNOSES:  1.  Diabetes mellitus, type 1, with complications and right below-knee      amputation.  2.  Hypoglycemia.  3.  Hypertension.   ADMISSION LABORATORIES:  Sodium 140, potassium 3.5, chloride 103, CO2 31,  BUN 20, creatinine 1.5, glucose 77.  Hemoglobin 12.9, hematocrit 38.  ABG  venous blood test with pH 7.29, pCO2 61.1 and bicarbonate 29.7.   BRIEF HISTORY:  This is a 35 year old African American male with a history  of type 2 diabetes mellitus, status post right BKA, who presented with an  episode of hypoglycemia, diarrhea, nausea and weakness.  Also significant  for having palpitations for four to five days.  Adequate p.o. intake.  The  patient was admitted for 23-hour observation.   HOSPITAL COURSE:  #1 - DIABETES MELLITUS, TYPE 1, WITH COMPLICATIONS:  The  patient was admitted and kept on Lantus 30 units subcutaneous q.h.s. and  also insulin sensitive sliding scale insulin.  CBGs at the ED were 178 at 7,  229 at 9 and 137 at 11 p.m.  During the 23-hour observation, the CBGs  improved.   #2 - HYPOGLYCEMIA:  The patient was admitted with low glucose observed by  EMS and ED CBGs.  He was kept for observation for 23 hours.  The CBG value  was 28 __________ K was  __________ normal saline 125 mL per hour in ED.  Trying to find possible causes of hypoglycemia.  The insulin schedule was  revised.  Also, sepsis workup was done finding out possible causes of  hypoglycemia.   #3 - IRREGULAR HEART RHYTHM:  An EKG was done showing PTCs with occasional  premature atrial complex. The patient was placed on  telemetry with normal  sinus rhythm at discharge.   #4 - HYPERTENSION:  The patient was kept on regular home medications for  high blood pressure, Norvasc and Toprol XL.   DISCHARGE MEDICATIONS:  1.  Hydrochlorothiazide/lisinopril 12.5/20 mg one p.o. daily.  2.  Norvasc 10 mg p.o. daily.  3.  Toprol XL 50 mg p.o. daily.  4.  Lantus insulin 30 units one subcutaneous at night.  5.  Aspirin 325 mg p.o. daily.  6.  Novolog sliding scale insulin.   DIET:  Carbohydrate-modified diet.   FOLLOWUP:  Followup appointment Dr. Ermalinda Memos on October 12, 2004, at 2:45 p.m.       IM/MEDQ  D:  11/03/2004  T:  11/03/2004  Job:  [id number]
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
## 2011-05-13 NOTE — Op Note (Signed)
[place]  Patient:    [name], [name] Visit Number: [id number] MRN: [id number]          Service Type: FTC Location: FOOT Attending Physician:  Newt Minion Dictated by:   Newt Minion, M.D. Proc. Date: 08/23/01 Admit Date:  07/10/2001 Discharge Date: 10/08/2001                             Operative Report  PREOPERATIVE DIAGNOSIS: Abscess and osteomyelitis, right foot.  OPERATION/PROCEDURE: Right below-the-knee amputation.  SURGEON: Newt Minion, M.D.  ANESTHESIA: General endotracheal.  ESTIMATED BLOOD LOSS: Minimal.  ANTIBIOTICS: Patient received preoperatively.  TOURNIQUET TIME: Twenty minutes at 300 mm Hg.  DISPOSITION: To PACU in stable condition.  NOTE: The patients initial operative note was dictated on the day of surgery. His initial operative note dictation number was [id number].  This dictation is unavailable and the operative procedure is redictated.  INDICATIONS FOR PROCEDURE: The patient is a 35 year old gentleman, with abscess and osteomyelitis of the right foot.  The patient has failed conservative care with wound debridement, unloading, and antibiotics, and presents at this time for definitive procedure.  The risks and benefits were discussed including infection, neurovascular injury, need for higher level amputation.  The patient states he understands and wishes to proceed at this time.  DESCRIPTION OF PROCEDURE: The patient was brought to the OR and underwent the above-mentioned procedure.  Details are unavailable at this time.  They were originally dictated on August 23, 2001.  The patient was taken to the PACU in stable condition, with plan to follow up in the office after discharge.Dictated by:   Newt Minion, M.D. Attending Physician:  Newt Minion DD:  02/06/02 TD:  02/06/02 Job: [id number]

## 2011-07-20 ENCOUNTER — Encounter: Payer: Self-pay | Admitting: Family Medicine

## 2011-07-20 DIAGNOSIS — E889 Metabolic disorder, unspecified: Secondary | ICD-10-CM | POA: Insufficient documentation

## 2011-07-20 DIAGNOSIS — M9080 Osteopathy in diseases classified elsewhere, unspecified site: Secondary | ICD-10-CM

## 2011-07-20 DIAGNOSIS — E1143 Type 2 diabetes mellitus with diabetic autonomic (poly)neuropathy: Secondary | ICD-10-CM | POA: Insufficient documentation

## 2011-07-20 DIAGNOSIS — K3184 Gastroparesis: Secondary | ICD-10-CM

## 2011-07-20 DIAGNOSIS — D649 Anemia, unspecified: Secondary | ICD-10-CM | POA: Insufficient documentation

## 2011-08-11 ENCOUNTER — Emergency Department (HOSPITAL_COMMUNITY): Payer: Medicare Other

## 2011-08-11 ENCOUNTER — Inpatient Hospital Stay (HOSPITAL_COMMUNITY)
Admission: EM | Admit: 2011-08-11 | Discharge: 2011-08-19 | DRG: 981 | Disposition: A | Discharge status: Home / Self Care | Payer: Medicare Other | Attending: Family Medicine | Admitting: Family Medicine

## 2011-08-11 ENCOUNTER — Ambulatory Visit: Payer: Medicare Other | Admitting: Family Medicine

## 2011-08-11 ENCOUNTER — Telehealth: Payer: Self-pay | Admitting: Family Medicine

## 2011-08-11 DIAGNOSIS — N179 Acute kidney failure, unspecified: Secondary | ICD-10-CM | POA: Diagnosis present

## 2011-08-11 DIAGNOSIS — E108 Type 1 diabetes mellitus with unspecified complications: Secondary | ICD-10-CM | POA: Diagnosis present

## 2011-08-11 DIAGNOSIS — K859 Acute pancreatitis without necrosis or infection, unspecified: Principal | ICD-10-CM | POA: Diagnosis present

## 2011-08-11 DIAGNOSIS — Z9119 Patient's noncompliance with other medical treatment and regimen: Secondary | ICD-10-CM

## 2011-08-11 DIAGNOSIS — I120 Hypertensive chronic kidney disease with stage 5 chronic kidney disease or end stage renal disease: Secondary | ICD-10-CM | POA: Diagnosis present

## 2011-08-11 DIAGNOSIS — IMO0002 Reserved for concepts with insufficient information to code with codable children: Secondary | ICD-10-CM | POA: Diagnosis present

## 2011-08-11 DIAGNOSIS — D631 Anemia in chronic kidney disease: Secondary | ICD-10-CM | POA: Diagnosis present

## 2011-08-11 DIAGNOSIS — D509 Iron deficiency anemia, unspecified: Secondary | ICD-10-CM | POA: Diagnosis present

## 2011-08-11 DIAGNOSIS — F3289 Other specified depressive episodes: Secondary | ICD-10-CM | POA: Diagnosis present

## 2011-08-11 DIAGNOSIS — Z91199 Patient's noncompliance with other medical treatment and regimen due to unspecified reason: Secondary | ICD-10-CM

## 2011-08-11 DIAGNOSIS — F329 Major depressive disorder, single episode, unspecified: Secondary | ICD-10-CM | POA: Diagnosis present

## 2011-08-11 DIAGNOSIS — N186 End stage renal disease: Secondary | ICD-10-CM

## 2011-08-11 DIAGNOSIS — S88119A Complete traumatic amputation at level between knee and ankle, unspecified lower leg, initial encounter: Secondary | ICD-10-CM

## 2011-08-11 DIAGNOSIS — E785 Hyperlipidemia, unspecified: Secondary | ICD-10-CM | POA: Diagnosis present

## 2011-08-11 DIAGNOSIS — E1029 Type 1 diabetes mellitus with other diabetic kidney complication: Secondary | ICD-10-CM

## 2011-08-11 DIAGNOSIS — N039 Chronic nephritic syndrome with unspecified morphologic changes: Secondary | ICD-10-CM | POA: Diagnosis present

## 2011-08-11 DIAGNOSIS — K219 Gastro-esophageal reflux disease without esophagitis: Secondary | ICD-10-CM | POA: Diagnosis present

## 2011-08-11 DIAGNOSIS — Z79899 Other long term (current) drug therapy: Secondary | ICD-10-CM

## 2011-08-11 DIAGNOSIS — Z9849 Cataract extraction status, unspecified eye: Secondary | ICD-10-CM

## 2011-08-11 DIAGNOSIS — I739 Peripheral vascular disease, unspecified: Secondary | ICD-10-CM | POA: Diagnosis present

## 2011-08-11 DIAGNOSIS — Z794 Long term (current) use of insulin: Secondary | ICD-10-CM

## 2011-08-11 DIAGNOSIS — F4310 Post-traumatic stress disorder, unspecified: Secondary | ICD-10-CM | POA: Diagnosis present

## 2011-08-11 DIAGNOSIS — N139 Obstructive and reflux uropathy, unspecified: Secondary | ICD-10-CM | POA: Diagnosis present

## 2011-08-11 DIAGNOSIS — I10 Essential (primary) hypertension: Secondary | ICD-10-CM

## 2011-08-11 DIAGNOSIS — G8929 Other chronic pain: Secondary | ICD-10-CM | POA: Diagnosis present

## 2011-08-11 DIAGNOSIS — Z7982 Long term (current) use of aspirin: Secondary | ICD-10-CM

## 2011-08-11 LAB — COMPREHENSIVE METABOLIC PANEL
ALT: 6 U/L (ref 0–53)
Albumin: 2.4 g/dL — ABNORMAL LOW (ref 3.5–5.2)
Alkaline Phosphatase: 79 U/L (ref 39–117)
BUN: 53 mg/dL — ABNORMAL HIGH (ref 6–23)
Chloride: 102 mEq/L (ref 96–112)
GFR calc Af Amer: 4 mL/min — ABNORMAL LOW (ref >60)
Glucose, Bld: 189 mg/dL — ABNORMAL HIGH (ref 70–99)
Potassium: 3.7 mEq/L (ref 3.5–5.1)
Sodium: 136 mEq/L (ref 135–145)
Total Bilirubin: 0.2 mg/dL — ABNORMAL LOW (ref 0.3–1.2)
Total Protein: 6.3 g/dL (ref 6.0–8.3)

## 2011-08-11 LAB — URINALYSIS, ROUTINE W REFLEX MICROSCOPIC
Glucose, UA: 500 mg/dL — AB
Ketones, ur: NEGATIVE mg/dL
Leukocytes, UA: NEGATIVE
Protein, ur: >300 mg/dL — AB
Urobilinogen, UA: 0.2 mg/dL (ref 0.0–1.0)

## 2011-08-11 LAB — POCT I-STAT TROPONIN I

## 2011-08-11 LAB — CBC
HCT: 28.1 % — ABNORMAL LOW (ref 39.0–52.0)
Hemoglobin: 9.5 g/dL — ABNORMAL LOW (ref 13.0–17.0)
MCHC: 33.8 g/dL (ref 30.0–36.0)
WBC: 6.7 10*3/uL (ref 4.0–10.5)

## 2011-08-11 LAB — DIFFERENTIAL
Basophils Absolute: 0.1 10*3/uL (ref 0.0–0.1)
Basophils Relative: 2 % — ABNORMAL HIGH (ref 0–1)
Lymphocytes Relative: 23 % (ref 12–46)
Monocytes Absolute: 0.4 10*3/uL (ref 0.1–1.0)
Neutro Abs: 4.4 10*3/uL (ref 1.7–7.7)
Neutrophils Relative %: 65 % (ref 43–77)

## 2011-08-11 LAB — URINE MICROSCOPIC-ADD ON

## 2011-08-11 NOTE — Telephone Encounter (Signed)
Patient called in crying and very upset, wanting to speak with Dr. Luberta Mutter.  Michela Pitcher that he has had diarrhea for over a month and is hurting in the area of his kidney.  I suggested that we make an appt for him to be seen.  He is on Dr. Kennyth Arnold schedule for this afternoon.

## 2011-08-12 ENCOUNTER — Encounter: Payer: Self-pay | Admitting: Family Medicine

## 2011-08-12 ENCOUNTER — Inpatient Hospital Stay (HOSPITAL_COMMUNITY): Payer: Medicare Other

## 2011-08-12 LAB — COMPREHENSIVE METABOLIC PANEL
ALT: 5 U/L (ref 0–53)
AST: 7 U/L (ref 0–37)
Alkaline Phosphatase: 60 U/L (ref 39–117)
CO2: 22 mEq/L (ref 19–32)
Chloride: 103 mEq/L (ref 96–112)
GFR calc non Af Amer: 4 mL/min — ABNORMAL LOW (ref >60)
Glucose, Bld: 113 mg/dL — ABNORMAL HIGH (ref 70–99)
Sodium: 136 mEq/L (ref 135–145)
Total Bilirubin: 0.2 mg/dL — ABNORMAL LOW (ref 0.3–1.2)

## 2011-08-12 LAB — GLUCOSE, CAPILLARY
Glucose-Capillary: 58 mg/dL — ABNORMAL LOW (ref 70–99)
Glucose-Capillary: 59 mg/dL — ABNORMAL LOW (ref 70–99)
Glucose-Capillary: 61 mg/dL — ABNORMAL LOW (ref 70–99)

## 2011-08-12 LAB — RAPID URINE DRUG SCREEN, HOSP PERFORMED
Barbiturates: NOT DETECTED
Benzodiazepines: NOT DETECTED
Cocaine: NOT DETECTED
Opiates: NOT DETECTED

## 2011-08-12 LAB — CBC
HCT: 24.3 % — ABNORMAL LOW (ref 39.0–52.0)
Hemoglobin: 8.3 g/dL — ABNORMAL LOW (ref 13.0–17.0)
RBC: 2.77 MIL/uL — ABNORMAL LOW (ref 4.22–5.81)

## 2011-08-12 LAB — TROPONIN I: Troponin I: <0.3 ng/mL (ref <0.30)

## 2011-08-12 LAB — MRSA PCR SCREENING: MRSA by PCR: POSITIVE — AB

## 2011-08-12 NOTE — H&P (Signed)
[facility] Admission History and Physical  Patient name: [name] Medical record number: [id number] Date of birth: 07/28/1976 Age: 35 y.o. Gender: male  Primary Care Provider: Dorthey Sawyer, MD  Chief Complaint: Vomiting and diarrhea History of Present Illness: [name] is a 35 y.o. year old male with previous history of uncontrolled diabetes and uncontrolled hypertension here with vomiting and diarrhea.  Has had ongoing vomiting and diarrhea for the past month along with diffuse abdominal pain.  He states he has been unable to keep any food or medications down for the past couple of days.  He also reports a 20lb weight loss over the past couple of months.  He denies blood in his stool but has noticed mucous.  He denies green bilious vomiting or blood in his vomit.  Patient was planning on coming to clinic as work in today but felt too sick to come.  He denies fever but has had shaking chills for the past week.  Upon arrival to ED he was found to have elevated blood pressure into the [id number] systolic and [id number] diastolic as well as worsening renal failure with creatinine of 14.  In the ED he was started on a Nitroglycerin drip and given his home metoprolol dose with his blood pressure being  titrated down to the [id number] systolic.  He does have mild headache that started after initiation of nitroglycerin.  He denies chest pain, shortness of breath, vision changes, weakness.  Regarding his renal failure at his last appointment with Dr. Posey Pronto they were considering starting dialysis.  He was supposed to see the vascular surgeons but failed to show up for his appointment in May.  He has not seen his pcp or nephrologist since this past May.   His last creatinine in March was 4.14.  He does still produce a small amount of urine, but that has been decreased since he has been sick.  Patient Active Problem List  Diagnoses  . DIABETES MELLITUS, TYPE I, UNCONTROLLED,  WITH COMPLICATIONS  . HYPERLIPIDEMIA  . DEPRESSION, MAJOR, RECURRENT  . POST TRAUMATIC STRESS DISORDER  . HYPERTENSION, BENIGN SYSTEMIC  . GERD  . Chronic kidney disease, stage 4, severely decreased GFR  . IMPOTENCE, ORGANIC  . AMPUTATION, BELOW KNEE, HX OF  . Nausea  . Constipation  . Amputation stump pain  . Anemia  . Nausea & vomiting  . Metabolic bone disease   Past Medical History: Past Medical History  Diagnosis Date  . Renal insufficiency   . Diabetes mellitus   . Chronic kidney disease, stage 4, severely decreased GFR   . Hypertension   . S/P BKA (below knee amputation) bilateral   . GERD (gastroesophageal reflux disease)   . Gastropathy   . Dyslipidemia   . Chronic pain   . Depression     Past Surgical History: Past Surgical History  Procedure Date  . Cataract surgery   . Bilateral bka     Social History: History   Social History  . Marital Status: Single    Spouse Name: N/A    Number of Children: N/A  . Years of Education: N/A   Social History Main Topics  . Smoking status: Never Smoker   . Smokeless tobacco: Not on file  . Alcohol Use: No  . Drug Use: 7 per week    Special: Marijuana     smokes pot  . Sexually Active: Not on file   Other Topics Concern  . Not on file  Social History Narrative  . No narrative on file    Family History: No family history on file.  Allergies: No Known Allergies  Current Outpatient Prescriptions  Medication Sig Dispense Refill  . amLODipine (NORVASC) 5 MG tablet Take 10 mg by mouth daily.        [name] aspirin 81 MG EC tablet Take 81 mg by mouth daily.        . Blood Glucose Monitoring Suppl (ONETOUCH PING METER REMOTE) SUPPLIES MISC by Does not apply route. To be used qAC and HS for insulin dosing.       . cloNIDine (CATAPRES) 0.3 MG tablet Take 1 tablet (0.3 mg total) by mouth 2 (two) times daily.  60 tablet  11  . esomeprazole (NEXIUM) 40 MG capsule Take 40 mg by mouth as directed. One tab by mouth in  the morning and then again at dinnertime       . furosemide (LASIX) 40 MG tablet Take 0.5 tablets (20 mg total) by mouth 2 (two) times daily.  60 tablet  3  . gabapentin (NEURONTIN) 300 MG capsule Take 1 capsule (300 mg total) by mouth as directed. Start by taking 1 tablet at nighttime x 1 day, then increase to twice a day for 1 day, then increase to 3 x per day.  60 capsule  6  . glucagon 1 MG injection Follow package directions for low blood sugar.  1 each  1  . insulin aspart (NOVOLOG FLEXPEN) 100 UNIT/ML injection Inject into the skin as directed. Inject 10 units prior to 3 meals per day if pre-meal blood glucose is higher than 120      . insulin glargine (LANTUS SOLOSTAR) 100 UNIT/ML injection Inject 55 Units into the skin at bedtime.       . Insulin Pen Needle (B-D ULTRAFINE III SHORT PEN) 31G X 8 MM MISC by Does not apply route. Inject 10 units prior to 3 meals per day if pre-meal blood glucose is higher than 120       . metoprolol (TOPROL-XL) 200 MG 24 hr tablet Take 2 tablets (400 mg total) by mouth daily.  60 tablet  6  . ondansetron (ZOFRAN) 4 MG tablet Take 1 tablet (4 mg total) by mouth every 8 (eight) hours as needed. For nausea  10 tablet  1  . pravastatin (PRAVACHOL) 20 MG tablet Take 20 mg by mouth daily.        [name] PRODIGY LANCETS 28G MISC by Does not apply route. To be used qAC and HS for insulin dosing.       . sertraline (ZOLOFT) 50 MG tablet Take 1 tablet (50 mg total) by mouth daily.  30 tablet  6  . torsemide (DEMADEX) 20 MG tablet Take 1 tablet (20 mg total) by mouth daily.  30 tablet  0   Review Of Systems: Per HPI  Otherwise 12 point review of systems was performed and was unremarkable.  Physical Exam: Pulse: 71  Blood Pressure: 156/89 RR: 16   O2: 99% on RA Temp: 98.6  General: alert, cooperative and no distress HEENT: PERRLA, extra ocular movement intact, sclera clear, anicteric, oropharynx clear, no lesions and neck supple with midline trachea Heart: S1, S2 normal, no  murmur, rub or gallop, regular rate and rhythm Lungs: clear to auscultation, no wheezes or rales and unlabored breathing Abdomen: Moderate diffuse tenderness, worse in epigastric region.  Bowel sounds normo-active. Non-distended. Extremities: Bilateral BKA, stumps are callused without erythema, tenderness, or swelling. Skin:no rashes,  no ecchymoses, no wounds Neurology: normal without focal findings and mental status, speech normal, alert and oriented x3  Labs and Imaging: CMET     Component Value Date/Time   NA 136 08/11/2011 1601   K 3.7 08/11/2011 1601   CL 102 08/11/2011 1601   CO2 23 08/11/2011 1601   GLUCOSE 189* 08/11/2011 1601   BUN 53* 08/11/2011 1601   CREATININE 15.27* 08/11/2011 1601   CALCIUM 8.2* 08/11/2011 1601   PROT 6.3 08/11/2011 1601   ALBUMIN 2.4* 08/11/2011 1601   AST 9 08/11/2011 1601   ALT 6 08/11/2011 1601   ALKPHOS 79 08/11/2011 1601   BILITOT 0.2* 08/11/2011 1601   GFRNONAA 4* 08/11/2011 1601   GFRAA 4* 08/11/2011 1601    Lab Results  Component Value Date   WBC 6.7 08/11/2011   HGB 9.5* 08/11/2011   HCT 28.1* 08/11/2011   MCV 87.8 08/11/2011   PLT 318 08/11/2011   Urine dipstick shows positive for RBC's and positive for protein.  Micro exam: 3-6 WBC's per HPF, 0-2 RBC's per HPF and Hyaline casts seen.  Lipase     Component Value Date/Time   LIPASE 124* 08/11/2011 1601   Fecal Occult Blood: Negative POC Trop: 0.02  CXR: No acute cardiopulmonary disease   Assessment and Plan: SKYLLER CUSHING is a 35 y.o. year old male presenting with vomiting and diarrhea found to have acute on chronic renal failure 1. GI:  Patient with persistent nausea and vomiting for the past month.  In reviewing previous discharge summaries looks like he has had similar episodes of this in the past.  Nausea not well controlled with zofran, will add on phenergan if zofran still not controlling nausea.  Lipase is elevated so certainly concerning for pancreatitis given that he has abdominal  pain and nausea.  Will obtain CT abdomen with PO contrast for evaluation of pancreas.  Could also be related to diabetic gastroparesis 2/2.  If not improving can consider doing gastric emptying study.  Will send stool for C. Diff, culture and O&P.   2.   Acute on chronic renal failure:  Patient with significantly elevated creatinine from previous.  Likely multifactorial of intrinsic and pre-renal etiology.  Will hydrate gently to see if creatinine improves with fluids.  Plan to consult renal service during this hospitalization since he was being considered for dialysis a few months ago.  No indications for emergent dialysis at this time as electrolytes are normal, not uremic and is not volume overloaded.   3.   HTN:  Patient has been unable to take PO medications at home.  On nitroglycerine drip in ED, BP has been titrated down at this point. Will titrate off nitroglycerin drip and get him back on his home medicines as he has been controlled on this.  Will monitor pressures and add on further interventions as needed.  4.   T1DM:  DM since age 29, poorly controlled, s/p b/l BKA.  Last A1C of 10.0.  Will recheck A1C during this admission.  Per his medications he is taking 50 units of Lantus qhs with SSI with meals.  Given that he has been eating poorly, I will place him only on SSI at this time.  Once he is eating more consistently we can start to add back on lantus.   5.   Anemia:  Likely related to chronic disease. MCV and RDW wnl.  Hemoccult negative.  Baseline seems to be around 10-10.5.  Will continue to follow. 6.  Depression/PTSD:  Plan to continue his wellbutrin and sertraline 7. HLD:  Continue Pravastatin 8. FEN/GI: Clear liquids, NS @ 139mL/hr 9. Prophylaxis: Heparin 5000 units SQ TID 10. Disposition: Pending improvement of vomiting/diarrhea and management of acute on chronic renal failure

## 2011-08-13 LAB — CLOSTRIDIUM DIFFICILE BY PCR: Toxigenic C. Difficile by PCR: NEGATIVE

## 2011-08-13 LAB — FERRITIN: Ferritin: 179 ng/mL (ref 22–322)

## 2011-08-13 LAB — IRON: Iron: 54 ug/dL (ref 42–135)

## 2011-08-13 LAB — GLUCOSE, CAPILLARY: Glucose-Capillary: 149 mg/dL — ABNORMAL HIGH (ref 70–99)

## 2011-08-13 LAB — HEPATITIS B CORE ANTIBODY, TOTAL: Hep B Core Total Ab: NEGATIVE

## 2011-08-13 LAB — HEPATITIS B SURFACE ANTIGEN: Hepatitis B Surface Ag: NEGATIVE

## 2011-08-14 LAB — HEMOGLOBIN A1C
Hgb A1c MFr Bld: 8.1 % — ABNORMAL HIGH (ref <5.7)
Mean Plasma Glucose: 186 mg/dL — ABNORMAL HIGH (ref <117)

## 2011-08-14 LAB — CBC
Hemoglobin: 8.3 g/dL — ABNORMAL LOW (ref 13.0–17.0)
Platelets: 247 10*3/uL (ref 150–400)
RBC: 2.8 MIL/uL — ABNORMAL LOW (ref 4.22–5.81)
WBC: 6.8 10*3/uL (ref 4.0–10.5)

## 2011-08-14 LAB — RENAL FUNCTION PANEL
CO2: 17 mEq/L — ABNORMAL LOW (ref 19–32)
GFR calc Af Amer: 5 mL/min — ABNORMAL LOW (ref >60)
GFR calc non Af Amer: 4 mL/min — ABNORMAL LOW (ref >60)
Glucose, Bld: 134 mg/dL — ABNORMAL HIGH (ref 70–99)
Potassium: 4.3 mEq/L (ref 3.5–5.1)
Sodium: 135 mEq/L (ref 135–145)

## 2011-08-14 LAB — GLUCOSE, CAPILLARY
Glucose-Capillary: 137 mg/dL — ABNORMAL HIGH (ref 70–99)
Glucose-Capillary: 129 mg/dL — ABNORMAL HIGH (ref 70–99)
Glucose-Capillary: 121 mg/dL — ABNORMAL HIGH (ref 70–99)

## 2011-08-15 DIAGNOSIS — N19 Unspecified kidney failure: Secondary | ICD-10-CM

## 2011-08-15 DIAGNOSIS — Z01810 Encounter for preprocedural cardiovascular examination: Secondary | ICD-10-CM

## 2011-08-15 LAB — GLUCOSE, CAPILLARY: Glucose-Capillary: 144 mg/dL — ABNORMAL HIGH (ref 70–99)

## 2011-08-15 LAB — OVA AND PARASITE EXAMINATION

## 2011-08-15 LAB — RENAL FUNCTION PANEL
Albumin: 2 g/dL — ABNORMAL LOW (ref 3.5–5.2)
BUN: 50 mg/dL — ABNORMAL HIGH (ref 6–23)
Calcium: 8.2 mg/dL — ABNORMAL LOW (ref 8.4–10.5)
Chloride: 107 mEq/L (ref 96–112)
Creatinine, Ser: 13.71 mg/dL — ABNORMAL HIGH (ref 0.50–1.35)

## 2011-08-15 LAB — GIARDIA/CRYPTOSPORIDIUM SCREEN(EIA): Cryptosporidium Screen (EIA): NEGATIVE

## 2011-08-16 DIAGNOSIS — N186 End stage renal disease: Secondary | ICD-10-CM

## 2011-08-16 DIAGNOSIS — I120 Hypertensive chronic kidney disease with stage 5 chronic kidney disease or end stage renal disease: Secondary | ICD-10-CM

## 2011-08-16 LAB — RENAL FUNCTION PANEL
Albumin: 2.1 g/dL — ABNORMAL LOW (ref 3.5–5.2)
Chloride: 106 mEq/L (ref 96–112)
GFR calc non Af Amer: 4 mL/min — ABNORMAL LOW (ref >60)
Potassium: 4.6 mEq/L (ref 3.5–5.1)

## 2011-08-16 LAB — CBC
Platelets: 274 10*3/uL (ref 150–400)
RDW: 13.4 % (ref 11.5–15.5)
WBC: 6.6 10*3/uL (ref 4.0–10.5)

## 2011-08-16 LAB — GLUCOSE, CAPILLARY
Glucose-Capillary: 196 mg/dL — ABNORMAL HIGH (ref 70–99)
Glucose-Capillary: 130 mg/dL — ABNORMAL HIGH (ref 70–99)
Glucose-Capillary: 180 mg/dL — ABNORMAL HIGH (ref 70–99)
Glucose-Capillary: 155 mg/dL — ABNORMAL HIGH (ref 70–99)

## 2011-08-17 LAB — FERRITIN: Ferritin: 112 ng/mL (ref 22–322)

## 2011-08-17 LAB — GLUCOSE, CAPILLARY: Glucose-Capillary: 130 mg/dL — ABNORMAL HIGH (ref 70–99)

## 2011-08-17 LAB — IRON AND TIBC: UIBC: 117 ug/dL

## 2011-08-18 DIAGNOSIS — I120 Hypertensive chronic kidney disease with stage 5 chronic kidney disease or end stage renal disease: Secondary | ICD-10-CM

## 2011-08-18 DIAGNOSIS — N186 End stage renal disease: Secondary | ICD-10-CM

## 2011-08-18 LAB — CULTURE, BLOOD (ROUTINE X 2)
Culture  Setup Time: 201208170848
Culture  Setup Time: 201208170848
Culture: NO GROWTH

## 2011-08-18 LAB — CBC
Hemoglobin: 9 g/dL — ABNORMAL LOW (ref 13.0–17.0)
MCH: 29.9 pg (ref 26.0–34.0)
MCHC: 33.6 g/dL (ref 30.0–36.0)
Platelets: 330 10*3/uL (ref 150–400)

## 2011-08-18 LAB — CROSSMATCH: ABO/RH(D): O POS

## 2011-08-18 LAB — RENAL FUNCTION PANEL
BUN: 52 mg/dL — ABNORMAL HIGH (ref 6–23)
Glucose, Bld: 155 mg/dL — ABNORMAL HIGH (ref 70–99)
Phosphorus: 6.3 mg/dL — ABNORMAL HIGH (ref 2.3–4.6)
Potassium: 4.9 mEq/L (ref 3.5–5.1)

## 2011-08-18 LAB — GLUCOSE, CAPILLARY
Glucose-Capillary: 148 mg/dL — ABNORMAL HIGH (ref 70–99)
Glucose-Capillary: 154 mg/dL — ABNORMAL HIGH (ref 70–99)

## 2011-08-18 NOTE — Consult Note (Signed)
  [name], PASSON NO.:  [id number]  MEDICAL RECORD NO.:  [id number]  LOCATION:  [facility]                         FACILITY:  [facility]  PHYSICIAN:  Arvil Persons, M.D.  DATE OF BIRTH:  1976-10-30  DATE OF CONSULTATION:  08/12/2011 DATE OF DISCHARGE:                                CONSULTATION   REASON FOR CONSULTATION:  Foley catheter placement.  HISTORY OF PRESENT ILLNESS:  The patient is 35 year old male with history of diabetes and hypertension who was admitted with vomiting and diarrhea.  He has a history of renal insufficiency.  The nurses were unable to insert a Foley catheter in his bladder.  I was asked to see him for catheter insertion.  PAST MEDICAL HISTORY:  Positive for renal insufficiency, diabetes, hypertension, dyslipidemia, and depression.  PAST SURGICAL HISTORY:  Status post bilateral BKA.  He had cataract surgery.  SOCIAL HISTORY:  He is single.  He has an 22 years old child.  He does not drink and smoke marijuana.  MEDICATIONS: 1. Amlodipine 10 mg daily. 2. Aspirin 81 mg. 3. Clonidine 0.1 mg twice a day. 4. Nexium 40 mg daily. 5. Lasix 20 mg b.i.d. 6. Gabapentin 300 mg 3 times a day. 7. Glucagon p.r.n. 8. NovoLog/Lantus 50 units nightly. 9. Metoprolol 400 mg. 10.Zofran 4 mg. 11.Pravastatin 20 mg. 12.Zoloft 50 mg.  ALLERGIES:  No known drug allergies.  FAMILY HISTORY:  Positive for hypertension and diabetes.  REVIEW OF SYSTEMS:  As noted in the HPI and everything else is negative.  PHYSICAL EXAMINATION:  GENERAL:  This is a 35 year old male who is in no acute distress.  He is alert and oriented to time, place, and person. VITAL SIGNS:  Stable. SKIN:  Warm and dry. ABDOMEN:  Soft, nondistended, and nontender.  The bladder is not distended.  He has no hepatomegaly and no splenomegaly.  He has no inguinal hernia.  No inguinal adenopathy. GU:  Penis is circumcised.  Meatus is normal.  Scrotum is normal.  He has no testicular mass.   Cords and epididymis are within normal limits. RECTAL:  Not done. EXTREMITIES:  He is status post bilateral BKA.  LABORATORY DATA:  His creatinine today is 14.87, BUN 51.  Hemoglobin is 8.3, hematocrit 24.3, and WBC 6.4.  IMPRESSION: 1. Renal insufficiency. 2. Diabetes. 3. Hypertension.  I inserted a #16 [place] Foley catheter in the bladder without difficulty and drained about 200 mL of clear urine.  The catheter was left to straight drainage.     Arvil Persons, M.D.     MN/MEDQ  D:  08/12/2011  T:  08/13/2011  Job:  [id number]  Electronically Signed by Hanley Ben M.D. on 08/18/2011 05:22:02 PM

## 2011-08-18 NOTE — Consult Note (Addendum)
NAMEDREDEN, DIBERNARDO NO.:  [id number]  MEDICAL RECORD NO.:  [id number]  LOCATION:  54                         FACILITY:  [facility]  PHYSICIAN:  Judeth Cornfield. Scot Dock, M.D.DATE OF BIRTH:  08-17-1976  DATE OF CONSULTATION:  08/16/2011 DATE OF DISCHARGE:                                CONSULTATION   CHIEF COMPLAINT:  End-stage renal disease.  HISTORY OF PRESENT ILLNESS:  Mr. Knauf is a 35 year old gentleman who developed insulin-dependent diabetes at the age of 35.  He has been fairly noncompliant and his diabetes has been poorly controlled as well as his hypertension.  He has had progressive chronic kidney disease over the last 2 years.  The patient had been referred to [place] in May for permanent hemodialysis access when his creatinine was in the 4 range, but the patient did not show for his appointment.  The patient on this admission presented with nausea, vomiting, creatinine 15 with abdominal pain and malignant hypertension with systolic pressure greater than 200. We were asked to see the patient for a placement of a permanent access. Of note, the patient is right-hand dominant.  He has also had bilateral below-knee amputations.  Vein mapping was reviewed, which showed small compressible less than 2 mm cephalic and basilic veins in the left upper extremity.  The right upper extremity veins were less than 1.5 mm and several areas were not visualized.  PAST MEDICAL HISTORY:  Significant for: 1. Uncontrolled hypertension. 2. Uncontrolled type 1 diabetes. 3. Increased cholesterol. 4. End-stage renal disease. 5. Peripheral artery disease with bilateral BKAs.  ALLERGIES:  He has no known drug allergies.  MEDICATIONS:  Toprol, Zocor, Norvasc, clonidine, Zoloft, and insulin.  SOCIAL HISTORY:  The patient is single.  He is not having any children. They has a history of marijuana use 7 days a week.  He denies alcohol or tobacco use.  FAMILY HISTORY:  Positive  for hypertension, diabetes.  REVIEW OF SYSTEMS:  As above.  He had came in with abdominal pain, nausea and vomiting, diarrhea.  Urinary wise, he had obstructive difficulties.  He denied headache, shortness of breath, chest pain, or lower extremity edema.  Neurologically, he states his urine output has decreased.  The rest of review of systems is negative.  PHYSICAL EXAMINATION:  GENERAL:  This is a well-developed and well- nourished young man in no acute distress.  He was alert and oriented x3. VITAL SIGNS:  Blood pressure is 187/102, his temperature of 97.3, his heart rate was 68. LUNGS:  Clear without wheezes, rales or rhonchi. CARDIAC:  His Heart Rate and rhythm were regular. EXTREMITIES:  Bilateral upper extremities were warm and pink with good capillary refill.  He had 2+ radial pulses, which were palpable bilaterally.  ASSESSMENT AND PLAN:  A 35 year old with uncontrolled diabetes and hypertension who now has end-stage renal disease and is in need of hemodialysis access, although he is not yet on dialysis.  Vein mapping shows small veins in the right upper extremity and small compressible veins on the left.  We may be able to reevaluate the left upper extremity with ultrasound in the operating room to assess if any of these veins might be adequate for arteriovenous fistula.  Otherwise, this the patient will need an arteriovenous Gore-Tex graft placed. These options were discussed with the patient and he showed understanding and asked appropriate questions.     Wray Kearns, PA-C   ______________________________ Judeth Cornfield. Scot Dock, M.D.    RR/MEDQ  D:  08/16/2011  T:  08/17/2011  Job:  [id number]  Electronically Signed by Deitra Mayo M.D. on 08/18/2011 10:13:02 AM Electronically Signed by Wray Kearns PA on 08/22/2011 10:23:02 AM

## 2011-08-19 LAB — GLUCOSE, CAPILLARY
Glucose-Capillary: 120 mg/dL — ABNORMAL HIGH (ref 70–99)
Glucose-Capillary: 122 mg/dL — ABNORMAL HIGH (ref 70–99)

## 2011-08-21 ENCOUNTER — Emergency Department (HOSPITAL_COMMUNITY)
Admission: EM | Admit: 2011-08-21 | Discharge: 2011-08-21 | Disposition: A | Discharge status: Home / Self Care | Payer: Medicare Other | Attending: Emergency Medicine | Admitting: Emergency Medicine

## 2011-08-21 ENCOUNTER — Telehealth: Payer: Self-pay | Admitting: Family Medicine

## 2011-08-21 DIAGNOSIS — R6883 Chills (without fever): Secondary | ICD-10-CM | POA: Insufficient documentation

## 2011-08-21 DIAGNOSIS — S88119A Complete traumatic amputation at level between knee and ankle, unspecified lower leg, initial encounter: Secondary | ICD-10-CM | POA: Insufficient documentation

## 2011-08-21 DIAGNOSIS — M79609 Pain in unspecified limb: Secondary | ICD-10-CM | POA: Insufficient documentation

## 2011-08-21 DIAGNOSIS — I10 Essential (primary) hypertension: Secondary | ICD-10-CM | POA: Insufficient documentation

## 2011-08-21 DIAGNOSIS — R209 Unspecified disturbances of skin sensation: Secondary | ICD-10-CM | POA: Insufficient documentation

## 2011-08-21 DIAGNOSIS — E119 Type 2 diabetes mellitus without complications: Secondary | ICD-10-CM | POA: Insufficient documentation

## 2011-08-21 LAB — CBC
MCV: 89.9 fL (ref 78.0–100.0)
Platelets: 324 10*3/uL (ref 150–400)
RBC: 2.86 MIL/uL — ABNORMAL LOW (ref 4.22–5.81)
RDW: 15 % (ref 11.5–15.5)
WBC: 11.8 10*3/uL — ABNORMAL HIGH (ref 4.0–10.5)

## 2011-08-21 LAB — BASIC METABOLIC PANEL
CO2: 18 mEq/L — ABNORMAL LOW (ref 19–32)
Chloride: 104 mEq/L (ref 96–112)
Creatinine, Ser: 17.43 mg/dL — ABNORMAL HIGH (ref 0.50–1.35)
GFR calc Af Amer: 4 mL/min — ABNORMAL LOW (ref >60)
Potassium: 5.2 mEq/L — ABNORMAL HIGH (ref 3.5–5.1)
Sodium: 135 mEq/L (ref 135–145)

## 2011-08-21 LAB — DIFFERENTIAL
Basophils Absolute: 0.1 10*3/uL (ref 0.0–0.1)
Eosinophils Absolute: 0.4 10*3/uL (ref 0.0–0.7)
Lymphs Abs: 1.3 10*3/uL (ref 0.7–4.0)
Neutrophils Relative %: 75 % (ref 43–77)

## 2011-08-21 NOTE — Telephone Encounter (Signed)
Received call from [name] stating that he is having increased pain and tingling in his arm where he recently had his graft done.  He states the area is reddened and very warm, does not see pus draining from the area.  Denies fever.  Concern for infection given recent surgery, advised to go to ED to have arm evaluated.  Patient agrees.

## 2011-08-22 ENCOUNTER — Ambulatory Visit: Payer: Medicare Other | Admitting: Family Medicine

## 2011-08-22 NOTE — Op Note (Signed)
  NAMESANDIP, GOTTSCHALL NO.:  [id number]  MEDICAL RECORD NO.:  [id number]  LOCATION:  56                         FACILITY:  [place]  PHYSICIAN:  Judeth Cornfield. Scot Dock, M.D.DATE OF BIRTH:  1976-09-11  DATE OF PROCEDURE:  08/18/2011 DATE OF DISCHARGE:                              OPERATIVE REPORT   PREOPERATIVE DIAGNOSIS:  Chronic kidney disease.  POSTOPERATIVE DIAGNOSIS:  Chronic kidney disease.  PROCEDURE:  New left upper arm arteriovenous graft.  SURGEON:  Judeth Cornfield. Scot Dock, MD  ASSISTANT:  Marga Hoots, RNFA  ANESTHESIA:  Local with sedation.  TECHNIQUE:  The patient was taken to the operating room and sedated by Anesthesia.  The left upper extremity was prepped and draped in the usual sterile fashion.  The patient had profuse placement of the graft in the forearm because of some tattoos and had desired placement of the graft in the upper arm.  Preoperative vein mapping showed the cephalic vein and basilic vein bilaterally were not adequate for placement of a fistula.  A longitudinal incision was made above the antecubital space with the brachial artery and it was dissected free.  It had a good pulse and was reasonable size.  A separate longitudinal incision was made beneath the axilla and the high brachial vein was dissected free.  It was controlled with vessel loop.  After the tunnel was anesthetized, a 4- 7 mm graft was tunneled between the two incisions.  The patient was then heparinized.  The brachial artery was clamped proximally and distally and a longitudinal arteriotomy was made.  A segment of 4 mm in the graft was excised.  The graft was spatulated and sewn end-to-side to the brachial artery using continuous 6-0 Prolene suture.  The graft was pulled to the appropriate length for anastomosis to the high brachial vein.  The vein was ligated distally and spatulated proximally.  The graft was cut to the appropriate length, spatulated, and  sewn end-to-end to the vein using continuous 6-0 Prolene suture.  At the completion, there was an excellent thrill in the graft.  There was a palpable radial pulse.  Hemostasis was obtained in the wounds.  The wounds were closed with a deep layer of 3-0 Vicryl.  The skin closed with 4-0 Vicryl.  A sterile dressing was applied.  The patient tolerated the procedure well and was transferred to the recovery room in stable condition.  All needle and sponge counts were correct.     Judeth Cornfield. Scot Dock, M.D.    CSD/MEDQ  D:  08/18/2011  T:  08/18/2011  Job:  [id number]  Electronically Signed by Deitra Mayo M.D. on 08/22/2011 10:27:18 AM

## 2011-08-23 ENCOUNTER — Encounter: Payer: Self-pay | Admitting: Physician Assistant

## 2011-08-23 NOTE — Consult Note (Signed)
[name], [name] NO.:  [id number]  MEDICAL RECORD NO.:  [id number]  LOCATION:  [facility]                         FACILITY:  [facility]  PHYSICIAN:  Louis Meckel, M.D.DATE OF BIRTH:  [birth year]  DATE OF CONSULTATION: DATE OF DISCHARGE:                                CONSULTATION   REQUESTING PHYSICIAN:  [place] A. Walker Kehr, MD  REASON FOR CONSULTATION:  Creatinine 15.  HISTORY OF PRESENT ILLNESS:  [name] is a 35 year old black male with past medical history significant for longstanding type 1 diabetes mellitus has been poorly controlled, hypertension, depression, and overall noncompliance.  He also has had progressive chronic kidney disease mostly over the last couple of years.  The patient was encountered by our Service and saw Dr. Posey Pronto in the office several months ago.  At that time creatinine was in the 3's and 4's, and plans were being made for him to get a permanent access to eventually initiate dialysis therapy.  Unfortunately, the patient did not show for [facility] evaluation and has not returned to see the doctor.  He now presents with abdominal pain, nausea, and vomiting and creatinine was noted to be 15 where it had been 4.14 in March of 2012.  An evaluation has been done and appears to be consistent with pancreatitis.  The patient is being treated for that.  He did have an abdominal CT scan which did not show any hydronephrosis.  He has malignant hypertension with SBPs greater than 200.  His urine output has only been 250 mL since admission. However, he does note some urinary obstructive symptoms and a bladder scan was done showing a volume of greater than 200.  The patient denies any NSAIDs.  It does not appear that he was on any ACEs or ARBs prior to admission.  PAST MEDICAL HISTORY: 1. Type 1 diabetes mellitus, poorly controlled. 2. Hypertension. 3. Hyperlipidemia. 4. 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 5.  Progressive CK D as above.  CURRENT MEDICATIONS:  Toprol IV p.r.n. for elevated blood pressure, Zocor, Norvasc, clonidine 0.1 mg b.i.d., Zoloft, and insulin.  The patient is also receiving IV fluids at 100 an hour.  ALLERGIES:  No known drug allergies.  The patient is single, does not have any children.  He has a history of using marijuana 7 days a week.  He denies alcohol or tobacco use.  FAMILY HISTORY:  Positive for hypertension and diabetes.  REVIEW OF SYSTEMS:  Mostly positive for abdominal pain, nausea, vomiting, diarrhea.  He also has some urinary obstructive difficulties. He denies headaches, shortness of breath, chest discomfort, lower extremity edema.  He does report that his urine output is decreased.  He denies any hematuria, dysuria, or excessive foaminess in the urine. Otherwise review of systems is negative.  PHYSICAL EXAMINATION:  VITAL SIGNS:  The patient is afebrile.  Blood pressure is 174/105, heart rate 76, respirations 17, and oxygen saturation is 100% on room air.  He has had 250 mL of urine output since admission. GENERAL:  He has a flat affect, but is alert.  He is in no acute distress.  He is in the fetal position mainly secondary to abdominal pain. HEENT:  Pupils are equal, round, and reactive to light, extraocular motions are intact.  Mucous membranes are moist. NECK:  There is no jugular venous distention. Lungs:  Clear to auscultation bilaterally without wheezes. CARDIOVASCULAR:  Regular rate and rhythm without murmur, gallop, or rub. ABDOMEN:  Positive bowel sounds.  There is generalized tenderness in all four quadrants.  There is no guarding or rebound.  EXTREMITIES:  Do not show any peripheral edema.  He has bilateral BKAs.  LABS:  Urinalysis showed protein with minimal cellularity and granular casts, potassium 3.8, bicarb 22, BUN and creatinine 51 and 14.87, and glucose of 113, hemoglobin of 8.3, and albumin of 2.2.  ASSESSMENT:  A 35 year old  black male with history of progressive chronic kidney disease is now acute on chronic kidney disease in the setting of probable pancreatitis.  1. Renal:  There were no absolute indications for dialysis right at     this time.  The patient would had been slowly approaching dialysis     in the outpatient setting.  There may be some elements of     reversibility here given that he had granular casts and has an     acute illness.  I do not think he is particularly dry, so we     recommend to the primary team to decrease IV fluids.  He could have     an element of hypertensive urgency contributing to his acute on     chronic renal failure as well.  My preference would be to treat his pancreatitis to treat his blood pressure over the weekend and monitor his kidney function status.  The very least we would want to place permanent hemodialysis access likely early next week.  If kidney function to worsen or if we believe he has become uremic, then we would place a tunnel dialysis catheter along with permanent access early next week.  I also believe may be some urinary obstruction has something to do with his acute on chronic renal failure so we will place Foley catheter and attempts to get accurate Is and Os. 1. Malignant hypertension per primary team.  He is on a number of     agents.  Malignant hypertension is difficult to manage in an     inpatient setting.  I would decrease IV fluids as stated above and     will likely need an increase in his clonidine dose. 2. Anemia most likely related to chronic kidney disease, also may have     some gastrointestinal bleeding.  We will check iron saturation and     start him on Aranesp therapy. 3. Bones.  We will check PTH and phosphorus and act as needed.  We     will follow with you.          ______________________________ Louis Meckel, M.D.     KAG/MEDQ  D:  08/12/2011  T:  08/12/2011  Job:  [id number]  Electronically Signed by  Corliss Parish M.D. on 08/23/2011 07:51:28 PM

## 2011-08-24 ENCOUNTER — Ambulatory Visit: Payer: Medicare Other

## 2011-08-25 ENCOUNTER — Telehealth: Payer: Self-pay

## 2011-08-25 ENCOUNTER — Encounter: Payer: Self-pay | Admitting: Vascular Surgery

## 2011-08-25 NOTE — Telephone Encounter (Signed)
Pt. called with c/o swelling in left arm. States swelling is "from top of my arm all the way to my fingers"  C/o tingling sensation in 4 fingers of left hand.  States hand is warm, and able to pick up glass or cup w/ left hand, when questioned. Doesn't think he has a fever, but states has chills off and on.   States incision looks red and "funny".  Denies drainage from incision. Has been elevating above level of heart, when questioned.  States swelling and numbness has worsened since surgery last wk.(8/23).  Advised will call pt. back w/ appt. to be evaluated.

## 2011-08-26 ENCOUNTER — Ambulatory Visit: Payer: Medicare Other | Admitting: Vascular Surgery

## 2011-08-27 HISTORY — PX: AV FISTULA PLACEMENT: SHX1204

## 2011-09-04 ENCOUNTER — Emergency Department (HOSPITAL_COMMUNITY): Payer: Medicare Other

## 2011-09-04 ENCOUNTER — Inpatient Hospital Stay (HOSPITAL_COMMUNITY)
Admission: EM | Admit: 2011-09-04 | Discharge: 2011-09-10 | DRG: 069 | Disposition: A | Discharge status: Home / Self Care | Payer: Medicare Other | Source: Ambulatory Visit | Attending: Family Medicine | Admitting: Family Medicine

## 2011-09-04 ENCOUNTER — Encounter: Payer: Self-pay | Admitting: Family Medicine

## 2011-09-04 DIAGNOSIS — IMO0002 Reserved for concepts with insufficient information to code with codable children: Secondary | ICD-10-CM | POA: Diagnosis present

## 2011-09-04 DIAGNOSIS — I120 Hypertensive chronic kidney disease with stage 5 chronic kidney disease or end stage renal disease: Secondary | ICD-10-CM | POA: Diagnosis present

## 2011-09-04 DIAGNOSIS — K3184 Gastroparesis: Secondary | ICD-10-CM | POA: Diagnosis not present

## 2011-09-04 DIAGNOSIS — Z794 Long term (current) use of insulin: Secondary | ICD-10-CM

## 2011-09-04 DIAGNOSIS — G458 Other transient cerebral ischemic attacks and related syndromes: Principal | ICD-10-CM | POA: Diagnosis present

## 2011-09-04 DIAGNOSIS — E875 Hyperkalemia: Secondary | ICD-10-CM | POA: Diagnosis present

## 2011-09-04 DIAGNOSIS — F1210 Cannabis abuse, uncomplicated: Secondary | ICD-10-CM | POA: Diagnosis present

## 2011-09-04 DIAGNOSIS — N039 Chronic nephritic syndrome with unspecified morphologic changes: Secondary | ICD-10-CM | POA: Diagnosis present

## 2011-09-04 DIAGNOSIS — Z79899 Other long term (current) drug therapy: Secondary | ICD-10-CM

## 2011-09-04 DIAGNOSIS — E1065 Type 1 diabetes mellitus with hyperglycemia: Secondary | ICD-10-CM | POA: Diagnosis present

## 2011-09-04 DIAGNOSIS — K219 Gastro-esophageal reflux disease without esophagitis: Secondary | ICD-10-CM | POA: Diagnosis present

## 2011-09-04 DIAGNOSIS — N186 End stage renal disease: Secondary | ICD-10-CM | POA: Diagnosis present

## 2011-09-04 DIAGNOSIS — Z7982 Long term (current) use of aspirin: Secondary | ICD-10-CM

## 2011-09-04 DIAGNOSIS — E785 Hyperlipidemia, unspecified: Secondary | ICD-10-CM | POA: Diagnosis present

## 2011-09-04 DIAGNOSIS — F339 Major depressive disorder, recurrent, unspecified: Secondary | ICD-10-CM | POA: Diagnosis present

## 2011-09-04 DIAGNOSIS — D631 Anemia in chronic kidney disease: Secondary | ICD-10-CM | POA: Diagnosis present

## 2011-09-04 DIAGNOSIS — S88119A Complete traumatic amputation at level between knee and ankle, unspecified lower leg, initial encounter: Secondary | ICD-10-CM

## 2011-09-04 DIAGNOSIS — E7800 Pure hypercholesterolemia, unspecified: Secondary | ICD-10-CM | POA: Diagnosis present

## 2011-09-04 LAB — TYPE AND SCREEN: Antibody Screen: NEGATIVE

## 2011-09-04 LAB — CBC
HCT: 25.1 % — ABNORMAL LOW (ref 39.0–52.0)
Hemoglobin: 8.4 g/dL — ABNORMAL LOW (ref 13.0–17.0)
MCHC: 33.5 g/dL (ref 30.0–36.0)
MCV: 88.1 fL (ref 78.0–100.0)
MCV: 87.7 fL (ref 78.0–100.0)
Platelets: 367 10*3/uL (ref 150–400)
RBC: 2.61 MIL/uL — ABNORMAL LOW (ref 4.22–5.81)
RDW: 13.8 % (ref 11.5–15.5)
RDW: 13.7 % (ref 11.5–15.5)
WBC: 7.8 10*3/uL (ref 4.0–10.5)

## 2011-09-04 LAB — URINALYSIS, ROUTINE W REFLEX MICROSCOPIC
Bilirubin Urine: NEGATIVE
Ketones, ur: NEGATIVE mg/dL
Nitrite: NEGATIVE
Specific Gravity, Urine: 1.02 (ref 1.005–1.030)
Urobilinogen, UA: 0.2 mg/dL (ref 0.0–1.0)
pH: 6 (ref 5.0–8.0)

## 2011-09-04 LAB — COMPREHENSIVE METABOLIC PANEL
ALT: 9 U/L (ref 0–53)
AST: 11 U/L (ref 0–37)
Albumin: 2.4 g/dL — ABNORMAL LOW (ref 3.5–5.2)
Alkaline Phosphatase: 76 U/L (ref 39–117)
Alkaline Phosphatase: 68 U/L (ref 39–117)
BUN: 84 mg/dL — ABNORMAL HIGH (ref 6–23)
CO2: 17 mEq/L — ABNORMAL LOW (ref 19–32)
Chloride: 101 mEq/L (ref 96–112)
Chloride: 102 mEq/L (ref 96–112)
Creatinine, Ser: 15.42 mg/dL — ABNORMAL HIGH (ref 0.50–1.35)
GFR calc Af Amer: 4 mL/min — ABNORMAL LOW (ref >60)
GFR calc non Af Amer: 4 mL/min — ABNORMAL LOW (ref >60)
GFR calc non Af Amer: 4 mL/min — ABNORMAL LOW (ref >60)
Glucose, Bld: 155 mg/dL — ABNORMAL HIGH (ref 70–99)
Potassium: 5.5 mEq/L — ABNORMAL HIGH (ref 3.5–5.1)
Potassium: 5.1 mEq/L (ref 3.5–5.1)
Sodium: 136 mEq/L (ref 135–145)
Total Bilirubin: 0.1 mg/dL — ABNORMAL LOW (ref 0.3–1.2)
Total Bilirubin: 0.2 mg/dL — ABNORMAL LOW (ref 0.3–1.2)

## 2011-09-04 LAB — GLUCOSE, CAPILLARY: Glucose-Capillary: 153 mg/dL — ABNORMAL HIGH (ref 70–99)

## 2011-09-04 LAB — PROTIME-INR: INR: 1.02 (ref 0.00–1.49)

## 2011-09-04 LAB — DIFFERENTIAL
Basophils Absolute: 0.1 10*3/uL (ref 0.0–0.1)
Blasts: 0 %
Lymphocytes Relative: 17 % (ref 12–46)
Lymphs Abs: 1.4 10*3/uL (ref 0.7–4.0)
Myelocytes: 0 %
Neutro Abs: 5.8 10*3/uL (ref 1.7–7.7)
Neutrophils Relative %: 72 % (ref 43–77)
Promyelocytes Absolute: 0 %

## 2011-09-04 LAB — APTT: aPTT: 35 seconds (ref 24–37)

## 2011-09-04 LAB — CK TOTAL AND CKMB (NOT AT ARMC)
CK, MB: 3.5 ng/mL (ref 0.3–4.0)
Total CK: 142 U/L (ref 7–232)

## 2011-09-04 LAB — CARDIAC PANEL(CRET KIN+CKTOT+MB+TROPI): CK, MB: 3.3 ng/mL (ref 0.3–4.0)

## 2011-09-04 NOTE — H&P (Signed)
[facility] Admission History and Physical  Patient name: [name] Medical record number: [id number] Date of birth: Jun 27, 1976 Age: 35 y.o. Gender: male  Primary Care Provider: Dorthey Sawyer, MD  Chief Complaint: slurring of speech and vision changes History of Present Illness: [name] is a 35 y.o. year old male with PMH significant for uncontrolled DM I and ESRD not yet on dialysis presenting with Right shoulder pain as well as symptoms concerning for TIA.  Patient states that for the past several days he has had increasing Right shoulder pain radiating into his arm as well as Right sided weakness.  Pain has been controlled with Oxycodone, which he was prescribed following a recent hospital admission.  He describes his weakness as generalized weakness from his neck down his Right side to his leg, not brought on by anything.  Today, after he awoke, he went outside this AM and described blurry vision.  Although he did not notice a difference, his girlfriend told him he was slurring his speech.  Denied any acute weakness, falls, syncopal episodes at that time.  As day continued, he began experiencing vague chest pain and worsening shortness of breath. He has had dyspnea and cough productive of white sputum for past week or so.  He also has been nausea without vomiting which started today.  The slurring of speech lasted for about 1 hour and then resolved, but due to his girlfriend's concern he finally came to the ED.  In the ED, his blood pressure was found to be 200s/108.  He was given 10 mg Labetolol with no change in his blood pressure and FPTS was called for admission.  Patient Active Problem List  Diagnoses  . DIABETES MELLITUS, TYPE I, UNCONTROLLED, WITH COMPLICATIONS  . HYPERLIPIDEMIA  . DEPRESSION, MAJOR, RECURRENT  . POST TRAUMATIC STRESS DISORDER  . HYPERTENSION, BENIGN SYSTEMIC  . GERD  . Chronic kidney disease, stage 4, severely decreased  GFR  . IMPOTENCE, ORGANIC  . AMPUTATION, BELOW KNEE, HX OF  . Nausea  . Constipation  . Amputation stump pain  . Anemia  . Nausea & vomiting  . Metabolic bone disease   Past Medical History: Past Medical History  Diagnosis Date  . Renal insufficiency   . Diabetes mellitus   . Chronic kidney disease, stage 4, severely decreased GFR   . Hypertension   . GERD (gastroesophageal reflux disease)   . Gastropathy   . Dyslipidemia   . Chronic pain   . Depression   . Edema   . Headache   . Hyperlipidemia     Past Surgical History: Past Surgical History  Procedure Date  . Cataract surgery   . Bilateral bka   . Below knee leg amputation     Social History: History   Social History  . Marital Status: Single    Spouse Name: N/A    Number of Children: N/A  . Years of Education: N/A   Social History Main Topics  . Smoking status: Never Smoker   . Smokeless tobacco: Not on file  . Alcohol Use: No  . Drug Use: 7 per week    Special: Marijuana     smokes pot  . Sexually Active: Not on file   Other Topics Concern  . Not on file   Social History Narrative  . No narrative on file    Family History: Family History  Problem Relation Age of Onset  . Diabetes Other   . Hypertension Other   .  Heart disease Other     Allergies: No Known Allergies  Current Outpatient Prescriptions  Medication Sig Dispense Refill  . amLODipine (NORVASC) 5 MG tablet Take 10 mg by mouth daily.        [name] aspirin 81 MG EC tablet Take 81 mg by mouth daily.        . Blood Glucose Monitoring Suppl (ONETOUCH PING METER REMOTE) SUPPLIES MISC by Does not apply route. To be used qAC and HS for insulin dosing.       [name] buPROPion (WELLBUTRIN SR) 100 MG 12 hr tablet Take 100 mg by mouth 3 (three) times daily.        . calcitRIOL (ROCALTROL) 0.25 MCG capsule Take 0.25 mcg by mouth daily.        . cloNIDine (CATAPRES) 0.3 MG tablet Take 1 tablet (0.3 mg total) by mouth 2 (two) times daily.  60 tablet   11  . ergocalciferol (VITAMIN D2) 50000 UNITS capsule Take 50,000 Units by mouth once a week.        . esomeprazole (NEXIUM) 40 MG capsule Take 40 mg by mouth as directed. One tab by mouth in the morning and then again at dinnertime       . furosemide (LASIX) 40 MG tablet Take 0.5 tablets (20 mg total) by mouth 2 (two) times daily.  60 tablet  3  . gabapentin (NEURONTIN) 300 MG capsule Take 1 capsule (300 mg total) by mouth as directed. Start by taking 1 tablet at nighttime x 1 day, then increase to twice a day for 1 day, then increase to 3 x per day.  60 capsule  6  . glucagon 1 MG injection Follow package directions for low blood sugar.  1 each  1  . insulin aspart (NOVOLOG FLEXPEN) 100 UNIT/ML injection Inject into the skin as directed. Inject 10 units prior to 3 meals per day if pre-meal blood glucose is higher than 120      . insulin glargine (LANTUS SOLOSTAR) 100 UNIT/ML injection Inject 55 Units into the skin at bedtime.       . Insulin Pen Needle (B-D ULTRAFINE III SHORT PEN) 31G X 8 MM MISC by Does not apply route. Inject 10 units prior to 3 meals per day if pre-meal blood glucose is higher than 120       . metoprolol (TOPROL-XL) 200 MG 24 hr tablet Take 2 tablets (400 mg total) by mouth daily.  60 tablet  6  . ondansetron (ZOFRAN) 4 MG tablet Take 1 tablet (4 mg total) by mouth every 8 (eight) hours as needed. For nausea  10 tablet  1  . pravastatin (PRAVACHOL) 20 MG tablet Take 20 mg by mouth daily.        [name] PRODIGY LANCETS 28G MISC by Does not apply route. To be used qAC and HS for insulin dosing.       . sertraline (ZOLOFT) 50 MG tablet Take 1 tablet (50 mg total) by mouth daily.  30 tablet  6  . torsemide (DEMADEX) 20 MG tablet Take 1 tablet (20 mg total) by mouth daily.  30 tablet  0   Review Of Systems: Per HPI with the following additions: none Otherwise 12 point review of systems was performed and was unremarkable.  Physical Exam: Pulse: 87  Blood Pressure: 205/101 RR: 17   O2:  100 on RA Temp: 98  General: alert, cooperative, appears stated age and no distress HEENT: PERRLA, extra ocular movement intact, sclera clear, anicteric  and oropharynx clear, no lesions.  Difficult to assess fundoscopy. Heart: RRR. 1/6 SEM is heard throughout the precordium Lungs: Rales noted BL lung bases with R>L.  No wheezing Abdomen: abdomen is soft without significant tenderness, masses, organomegaly or guarding Extremities: s/p BKA.  Stumps well healed.   Skin:no rashes, no ecchymoses Neurology: normal without focal findings, mental status, speech normal, alert and oriented x3, PERLA, cranial nerves 2-12 intact, reflexes normal and symmetric and sensation grossly normal.  Strength 4/5 Right UE, seems to be limited by pain. MSK:  Patient refused testing of Right shoulder secondary to pain.    Labs and Imaging: Lab Results  Component Value Date/Time   NA 136 09/04/2011  3:28 PM   K 5.5* 09/04/2011  3:28 PM   CL 101 09/04/2011  3:28 PM   CO2 19 09/04/2011  3:28 PM   BUN 84* 09/04/2011  3:28 PM   CREATININE 15.42* 09/04/2011  3:28 PM   GLUCOSE 155* 09/04/2011  3:28 PM   Lab Results  Component Value Date   WBC 8.1 09/04/2011   HGB 8.4* 09/04/2011   HCT 25.1* 09/04/2011   MCV 88.1 09/04/2011   PLT 380 09/04/2011   CXR:  BL interstitial edema, suspected Right pleural effusion CT Head:  Negative for acute pathology   Assessment and Plan: [name] is a 35 y.o. year old male presenting with slurring of speech and vision changes, now resolved, as well as right-sided pain and weakness of several days duration, concerning for TIA: 1. TIA:  Plan to admit for TIA rule out.  CT Head negative for bleed, which was a concern with patient's hypertensive urgency.  Plan to admit to step-down unit, obtain carotid dopplers, MRI/MRA of head.  Patient's symptoms appear to be TIA rather than stroke; The slurring of speech and vision changes occurred today he states he has had increasing pain and weakness in his  entire Right side, not conforming to any particular neurological distribution, for the past several weeks.  .  Less likely he has had retinal detachment as he denies any eye pain, changes in vision currently, has no conjuctival injection, and has afferrent pupillary reflex present. 2. Chest pain:  Likely secondary to increasing pulmonary edema, most also rule out cardiac etiology of pain.  Only EKG change is flipped T waves in V1.  Plan to obtain full set of cardiac enzymes, cycled x 2.   3.  Hypertensive urgency:  S/p 10 mg of Labetolol in ED followed by 20 mg Labetolol.  Will need to obtain MRI to assess for any neurological damage; if damage is present, will be less aggressive in blood pressure lowering.   4.  Renal failure:  Hard to tell if he is having an acute episode.  His creatinine on admission and discharge last hospitalization was 15, and it is 15 again today.  Again, although he appears volume overloaded, not in acute distress at this time.  Will consult renal for further input.  He was started on Phoslo for hyperphosphatemia last admission, plan to continue this. 4.  Pulmonary edema:  Several possible etiologies: overload secondary to cardiopulmonary disease versus renal disease most likely.  Have stopped his IVF in ED and will do Lasix challenge.  Though he is overloaded on exam and by radiologic imaging, does not yet have any acute need for dialysis.  He may yet be started on dialysis this admission, in which case he will need portable access site as his fistula was just placed  8/23.   5.  Anemia:  Hgb baseline appears to be between 8-9 for the past several months.  Prior to this his baseline appeared to be around 10.  Likely combination of anemia of chronic disease as well as iron deficiency.  As ESRD patient, parenteral iron may be better choice for him 6. DM I:  Uncontrolled.  Patient last discharged home on Lantus 2 units daily as well as SSI.  Plan to cover with SSI and 2 units of Lantus  while in house.  Will obtain QAC and QHS CBGs 7.  Depression:  Continue Sertraline and Welbutrin. 8.  Hyperlipidemia:  Continue home pravastatin 9. Hyperkalemia:  5.5 in ED based on POC testing.  No EKG changes.  Will obtain stat labs to further evaluate and treat accordingly. 10. FEN/GI: renal diet 11. Prophylaxis: lovenox per TIA admit order set 12. Disposition: pending further improvement   Annabell Sabal MD [phone]

## 2011-09-05 DIAGNOSIS — N186 End stage renal disease: Secondary | ICD-10-CM

## 2011-09-05 DIAGNOSIS — I501 Left ventricular failure: Secondary | ICD-10-CM

## 2011-09-05 LAB — MRSA PCR SCREENING: MRSA by PCR: POSITIVE — AB

## 2011-09-05 LAB — GLUCOSE, CAPILLARY
Glucose-Capillary: 113 mg/dL — ABNORMAL HIGH (ref 70–99)
Glucose-Capillary: 118 mg/dL — ABNORMAL HIGH (ref 70–99)
Glucose-Capillary: 86 mg/dL (ref 70–99)
Glucose-Capillary: 99 mg/dL (ref 70–99)

## 2011-09-05 LAB — BASIC METABOLIC PANEL
BUN: 85 mg/dL — ABNORMAL HIGH (ref 6–23)
GFR calc Af Amer: 4 mL/min — ABNORMAL LOW (ref >60)
GFR calc non Af Amer: 4 mL/min — ABNORMAL LOW (ref >60)
Potassium: 5.4 mEq/L — ABNORMAL HIGH (ref 3.5–5.1)

## 2011-09-05 LAB — CBC
HCT: 21.1 % — ABNORMAL LOW (ref 39.0–52.0)
MCHC: 33.6 g/dL (ref 30.0–36.0)
Platelets: 356 10*3/uL (ref 150–400)
Platelets: 396 10*3/uL (ref 150–400)
RBC: 2.6 MIL/uL — ABNORMAL LOW (ref 4.22–5.81)
RDW: 13.7 % (ref 11.5–15.5)
WBC: 6.3 10*3/uL (ref 4.0–10.5)
WBC: 6.5 10*3/uL (ref 4.0–10.5)

## 2011-09-05 LAB — AMYLASE: Amylase: 104 U/L (ref 0–105)

## 2011-09-05 LAB — LIPID PANEL
HDL: 48 mg/dL (ref >39)
LDL Cholesterol: 76 mg/dL (ref 0–99)
Total CHOL/HDL Ratio: 3 RATIO

## 2011-09-05 LAB — CARDIAC PANEL(CRET KIN+CKTOT+MB+TROPI)
Total CK: 107 U/L (ref 7–232)
Troponin I: <0.3 ng/mL (ref <0.30)

## 2011-09-05 LAB — HEMOGLOBIN A1C
Hgb A1c MFr Bld: 7.1 % — ABNORMAL HIGH (ref <5.7)
Mean Plasma Glucose: 157 mg/dL — ABNORMAL HIGH (ref <117)

## 2011-09-05 LAB — LIPASE, BLOOD: Lipase: 17 U/L (ref 11–59)

## 2011-09-05 NOTE — H&P (Signed)
NAMEMARLA, Caldwell NO.:  [id number]  MEDICAL RECORD NO.:  [id number]  LOCATION:  [facility]                         FACILITY:  [facility]  PHYSICIAN:  Alaze Garverick A. Walker Kehr, M.D.    DATE OF BIRTH:  [birth year]  DATE OF ADMISSION:  08/11/2011 DATE OF DISCHARGE:                             HISTORY & PHYSICAL   PRIMARY CARE PROVIDER:  Dorthey Sawyer, MD, at the [facility].  CHIEF COMPLAINT:  Vomiting and diarrhea.  HISTORY OF PRESENT ILLNESS:  [name] is a 35 year old male with previous history of uncontrolled diabetes and uncontrolled hypertension, here with vomiting and diarrhea.  Has had ongoing vomiting and diarrhea for the past month along with diffuse abdominal pain.  He states he has been unable to keep any food or medication down for the past couple of days.  He also reports a 20-pound weight loss over the past couple of months.  He denies blood in his stool, but has noticed mucus.  He denies green bilious vomiting or blood in his vomit.  The patient was planning on coming to clinic as a work-in today, but felt too sick to come.  He denies fever, but has had shaking chills for the past week.  Upon arrival to the emergency department, he was found to have elevated blood pressure into the 123456 systolic and 123XX123 diastolic as well as worsening renal failure with creatinine of 15.  In the ED, he was started on nitroglycerin drip and given his home metoprolol dose with his blood pressure being titrated down to Q000111Q systolic.  He does have mild headache associated after initiation of nitroglycerin.  He denies chest pain, shortness of breath, vision changes or weakness.  Regarding his renal failure at his appointment with Dr. Posey Pronto, they were considering starting dialysis.  He was supposed to see the Vascular surgeon, but failed to show for his appointment in May.  He has not seen his PCP or nephrologist since May either.  His last creatinine in  March was 4.14. He does still produces small amount of urine, but that has been decreased since he has been acutely ill.  PAST MEDICAL HISTORY: 1. Chronic kidney disease. 2. Type 1 diabetes mellitus since age 40.  Numerous complications     including bilateral BKA as well as chronic kidney disease. 3. Hypertension. 4. Status post BKA bilaterally. 5. GERD. 6. Gastropathy 7. Dyslipidemia. 8. Chronic pain. 9. Depression/PTSD.  PAST SURGICAL HISTORY: 1. Cataract surgery. 2. Bilateral BKA.  SOCIAL HISTORY:  The patient is single.  He does have an 59 year old daughter.  He does smoke marijuana approximately 7 times per week.  He denies alcohol use.  ALLERGIES:  No known drug allergies.  He does report intolerance to pork and dairy products.  MEDICATIONS: 1. Amlodipine 10 mg daily. 2. Aspirin 81 mg daily. 3. Clonidine 0.1 mg b.i.d. 4. Nexium 40 mg daily. 5. Lasix 20 mg b.i.d. 6. Gabapentin 300 mg t.i.d. 7. Glucagon as needed. 8. NovoLog per home sliding scale. 9. Lantus 50 units nightly. 10.Metoprolol 400 mg daily. 11.Zofran 4 mg daily hours p.r.n. 12.Pravastatin 20 mg daily. 13.Zoloft 50 mg daily.  REVIEW OF SYSTEMS:  Per HPI.  PHYSICAL  EXAMINATION:  VITAL SIGNS:  Pulse 71, blood pressure 156/89, respiration rate 16, O2 saturation 99% on room air, temperature 98.6. GENERAL:  Alert, cooperative, in no distress. HEENT: Pupils equal, round, reactive to light.  Extraocular movements intact.  Sclerae are clear and anicteric.  Oropharynx is clear without lesions. NECK:  Supple with midline trachea. HEART:  Normal S1 and S2.  No murmur, rub, or gallop.  Regular rate and rhythm. LUNGS:  Clear to auscultation without wheezes or rales and unlabored breathing. ABDOMEN:  Does have moderate diffuse tenderness, worse in the epigastric region.  Bowel sounds are normoactive.  The abdomen is nondistended. EXTREMITIES: With bilateral BKA, stump are callused without  erythema, tenderness or swelling. SKIN:  Without rashes.  No bruising or wounds seen at this time. NEUROLOGIC:  Normal without focal findings of mental status.  Speech normal.  Alert and oriented x3.  LABS AND IMAGING:  Chemistry:  Sodium 136, potassium 3.7, chloride 102, bicarb 23, BUN of 53, creatinine of 15.27, glucose 189, calcium 8.2, protein 6.3, albumin 2.4, AST 9, ALT 6, alk phos of 79, total bili 0.2. WBCs were 6.7, hemoglobin 9.5, hematocrit 28.1, platelets of 318.  Urine dipstick shows positive for rbc's and positive for protein.  Microscopic examination demonstrates 3-6 white blood cells per high power field, 0-2 red blood cells per high power field, and hyaline casts.  Lipase elevated to 124.  Fecal occult blood was negative.  Point-of-care troponins are 0.02.  Chest x-ray was without acute cardiopulmonary disease.  ASSESSMENT/PLAN:  [name] is a 35 year old male presenting with vomiting and diarrhea, found to have acute on chronic renal failure. 1. GI:  The patient with persistent nausea, vomiting for the past     month.  On reviewing previous discharge summary, it looks like he     has had similar episodes of this in the past.  Nausea has not been     well controlled with Zofran.  We will add on Phenergan and Zofran     if still not controlling his nausea.  Lipase is elevated     considerably concerning for pancreatitis given that he has     abdominal pain and nausea.  We will obtain CT of the abdomen and     p.o. contrast for reevaluation of the pancreas.  Could also related     to diabetic gastroparesis secondary to his uncontrolled diabetes.     If it is not improved, can consider doing gastric emptying study.     We will send stool for C. diff culture, and O and P to look for any     infectious causes. 2. Acute on chronic renal failure.  The patient with significantly     elevated creatinine from previous admission.  Likely multifactorial     of intrinsic and  prerenal etiology.  We will hydrate gently to see     if creatinine improves with fluids given he has been vomiting and     having diarrhea recently.  Plan to consult Renal Service during     hospitalization since he was being considered for dialysis a few     months ago.  No indications for emergent dialysis at this time as     his electrolytes are normal and he is not uremic. 3. Hypertension.  Patient has been unable to take p.o. medications at     home.  He was on nitroglycerin drip in the emergency department.     Blood pressure was titrated  down at this point to Q000111Q systolic.  We     will titrate up nitroglycerin drip and get him back on his home     medicines as he has been controlled on those previously.  We will     monitor pressures and add on further interventions as needed. 4. Type 1 diabetes mellitus.  Diabetes diagnosis since age 5.  This     is an poorly controlled with multiple complications.  He is status     post bilateral below-knee amputation.  Last A1c of 10.  We will     recheck A1c during this admission.  For his medications, he is     taking 50 units of Lantus nightly with sliding scale insulin with     meals at home.  Given that he has been eating poorly, I will place     him only on sliding scale insulin at this time.  Once he is eating     more consistently, we can start to add back on his Lantus. 5. Anemia, likely related to his chronic disease.  MCV and RDW are     within normal limits.  Hemoccult is negative.  Baseline seems to be     around 10 to 10.5.  We will continue to follow. 6. Depression/post-traumatic stress disorder.  Plan to continue his     sertraline. 7. Hyperlipidemia.  Continue pravastatin. 8. FEN/GI.  Clear liquids as well as normal saline at 100 mL per hour. 9. Prophylaxis.  Heparin 5000 units subcu t.i.d. 10.Disposition.  Pending improvement of vomiting/diarrhea and     management of acute on chronic renal  failure.    ______________________________ Luetta Nutting, MD   ______________________________ Arty Baumgartner. Walker Kehr, M.D.    CM/MEDQ  D:  08/12/2011  T:  08/12/2011  Job:  [id number]  Electronically Signed by Luetta Nutting MD on 08/18/2011 03:12:43 PM Electronically Signed by [name] M.D. on 09/05/2011 10:21:13 AM

## 2011-09-05 NOTE — Discharge Summary (Signed)
NAMEIQBAL, [name] NO.:  [id number]  MEDICAL RECORD NO.:  [id number]  LOCATION:  48                         FACILITY:  [facility]  PHYSICIAN:  Monterey A. Walker Kehr, M.D.    DATE OF BIRTH:  [birth year]  DATE OF ADMISSION:  08/11/2011 DATE OF DISCHARGE:  08/19/2011                              DISCHARGE SUMMARY   PRIMARY CARE PHYSICIAN:  Dorthey Sawyer, MD  CONSULTANTS DURING THIS HOSPITALIZATION:  Renal and vascular surgery.  PROCEDURES AND RESULTS:  Left arm AV fistula for dialysis on August 18, 2011:  Left arm AV fistula for dialysis.  REASON FOR ADMISSION:  Hypertension, vomiting and diarrhea.  DISCHARGE DIAGNOSES:   Primary:  Acute on chronic renal failure. Secondary:  Mild pancreatitis, malignant hypertension and type 1 diabetes.  DISCHARGE MEDICATIONS: 1. Amlodipine 10 mg by mouth b.i.d. 2. Calcium acetate or PhosLo 1334 mg by mouth 3 times daily with     meals. 3. Hydralazine 25 mg by mouth 4 times daily. 4. Labetalol 200 mg 2 tablets by mouth twice daily. 5. Zofran 4 mg by mouth every 8 h. as needed for nausea. 6. Oxycodone 5 mg IR 1-2 tablets by mouth every 6 h. as needed for     pain. 7. Simvastatin 10 mg 1 tablet by mouth daily. 8. Lantus 2 units subcutaneously daily at bedtime. 9. Aspirin 81 mg 1 tablet by mouth daily. 10.Clonidine 0.1 mg by mouth b.i.d. 11.Gabapentin 300 mg 1 capsule by mouth b.i.d. 12.Nexium 1 capsule by mouth nightly. 13.NovoLog FlexPen 2-10 units subcutaneously 3 times a day before     meals. 14.Sertraline 50 mg 1 tablet by mouth daily. 15.Wellbutrin 1 tablet by mouth 3 times daily.  HOSPITAL COURSE: 1. Pancreatitis:  The patient presented to the ED with several day     history of uncontrolled nausea, emesis and abdominal tenderness     which was greater on the epigastric region.  A CT of the abdomen     revealed mild pancreatitis.  Lipase was elevated to 124 at time of     admission.  The patient was given fluids and  made n.p.o. and was     given Zofran, Phenergan and Reglan intermittently during hospital     course to manage nausea.  The patient diet advanced slowly as     tolerated as nausea, emesis, and abdominal pain subsided.  The     patient was tolerating a regular diet and was without abdominal     pain by time of discharge.  The patient sent home with low-dose     Zofran for mild persistent nausea.  Resolved. 2. Acute on chronic renal failure:  The patient's baseline creatinine     prior to admission was in the 3-4 range, but was found to be 15.27     at time of admission.  Renal was consulted and followed throughout     hospital course.  The patient was never uremic.  Urine output was     diminished, but improved during hospital course.  Due to patient     stability despite poor creatinine function, the decision was made     to not dialyze the patient at during hospitalization.  Vascular was     consulted and placed a AV fistula and the patient's left arm on     August 18, 2011, for future dialysis.  The patient tolerated the     procedure well and was discharged with a followup appointment with     Dr. Posey Pronto for late September.  Creatinine at the time of discharge     was 15.62.      The patient's phosphorus was also noted to be elevated on August 13, 2011, and was placed on PhosLo on August 15, 2011.  Thought to be due to renal failure     Phosphorus was still elevated at 6.4 at time of discharge.  A corrected     calcium was also elevated at 10.6 at time of discharge, so     calcitriol was discontinued at the time of discharge.  4. Malignant hypertension.  The patient's blood pressure at the time     of presentation to the ED was found to be in the mid 123456 systolic     over 123XX123 diastolic.  The patient immediately placed on nitro drip     which was titrated to keep a systolic blood pressure from the 150s-     180s.  The patient's blood pressure was slowly corrected and was      eventually weaned from a nitro drip and placed back to home     medication regimen with few changes.  The patient's blood pressure     was in the 130s-140s at the time of discharge and was sent home on     his hospital regimen as mentioned above.  The patient never     experienced any stroke-like or orthostatic type symptoms     during hospital course.  Resolved.  5. Diabetes type 1:  The patient's diabetes was well controlled during     hospital stay on a sensitive sliding-scale insulin.  His home     regimen of Lantus 50 units nightly was held due to concern for     renal impairment.  The patient experienced 1 episode of     hypoglycemia while n.p.o. with a blood sugar to the 60s which was     corrected with orange juice in a half amp of D5.  The patient     discharged on the above diabetic regimen with blood sugars in the     mid 100s.  The patient educated and compliant on home sliding-scale     insulin. 6. Anemia:  Anemia of chronic disease versus iron-deficiency.  The     patient's anemia panel suggestive of possible iron deficiency.  The     patient will likely need EPO in the future.  Suggested the patient receive iron supplementation as outpatient.  PATIENT'S CONDITION AT THE TIME OF DISCHARGE:  Good.  DISPOSITION:  Home.  DISCHARGE FOLLOWUP: 1. Tereasa Coop at the [facility] on August 22, 2011, at 10:45 a.m. 2. Dr. Posey Pronto with [facility] on September 21, 2011, at     9:10 a.m.  FOLLOWUP ISSUES:  Elevated phosphorus and calcium, acute on chronic renal failure, diabetes, anemia.    ______________________________ Linna Darner, MD   ______________________________ Arty Baumgartner. Walker Kehr, M.D.    DM/MEDQ  D:  08/19/2011  T:  08/20/2011  Job:  [id number]  Electronically Signed by Linna Darner MD on 08/30/2011 04:17:44 PM Electronically Signed by [name] M.D. on 09/05/2011  10:22:37 AM

## 2011-09-06 ENCOUNTER — Encounter (HOSPITAL_COMMUNITY): Payer: Medicare Other

## 2011-09-06 ENCOUNTER — Inpatient Hospital Stay (HOSPITAL_COMMUNITY): Payer: Medicare Other

## 2011-09-06 DIAGNOSIS — R0989 Other specified symptoms and signs involving the circulatory and respiratory systems: Secondary | ICD-10-CM

## 2011-09-06 LAB — RENAL FUNCTION PANEL
Albumin: 2.2 g/dL — ABNORMAL LOW (ref 3.5–5.2)
BUN: 87 mg/dL — ABNORMAL HIGH (ref 6–23)
Calcium: 8.2 mg/dL — ABNORMAL LOW (ref 8.4–10.5)
Creatinine, Ser: 16.08 mg/dL — ABNORMAL HIGH (ref 0.50–1.35)
GFR calc non Af Amer: 3 mL/min — ABNORMAL LOW (ref >60)

## 2011-09-06 LAB — GLUCOSE, CAPILLARY
Glucose-Capillary: 81 mg/dL (ref 70–99)
Glucose-Capillary: 138 mg/dL — ABNORMAL HIGH (ref 70–99)

## 2011-09-06 LAB — URINE CULTURE: Culture: NO GROWTH

## 2011-09-06 LAB — CBC
MCH: 28.8 pg (ref 26.0–34.0)
MCV: 88.2 fL (ref 78.0–100.0)
Platelets: 414 10*3/uL — ABNORMAL HIGH (ref 150–400)
RBC: 3.13 MIL/uL — ABNORMAL LOW (ref 4.22–5.81)
RDW: 14 % (ref 11.5–15.5)
WBC: 5.1 10*3/uL (ref 4.0–10.5)

## 2011-09-07 ENCOUNTER — Inpatient Hospital Stay (HOSPITAL_COMMUNITY): Payer: Medicare Other

## 2011-09-07 LAB — CBC
HCT: 25 % — ABNORMAL LOW (ref 39.0–52.0)
Hemoglobin: 8.4 g/dL — ABNORMAL LOW (ref 13.0–17.0)
MCH: 29.4 pg (ref 26.0–34.0)
MCHC: 33.6 g/dL (ref 30.0–36.0)
RDW: 13.7 % (ref 11.5–15.5)

## 2011-09-07 LAB — GLUCOSE, CAPILLARY
Glucose-Capillary: 93 mg/dL (ref 70–99)
Glucose-Capillary: 153 mg/dL — ABNORMAL HIGH (ref 70–99)
Glucose-Capillary: 112 mg/dL — ABNORMAL HIGH (ref 70–99)
Glucose-Capillary: 147 mg/dL — ABNORMAL HIGH (ref 70–99)

## 2011-09-07 LAB — BASIC METABOLIC PANEL
BUN: 64 mg/dL — ABNORMAL HIGH (ref 6–23)
Calcium: 8.7 mg/dL (ref 8.4–10.5)
GFR calc non Af Amer: 4 mL/min — ABNORMAL LOW (ref >60)
Glucose, Bld: 94 mg/dL (ref 70–99)

## 2011-09-08 ENCOUNTER — Inpatient Hospital Stay (HOSPITAL_COMMUNITY): Payer: Medicare Other

## 2011-09-09 LAB — BASIC METABOLIC PANEL
BUN: 30 mg/dL — ABNORMAL HIGH (ref 6–23)
CO2: 26 mEq/L (ref 19–32)
Calcium: 8.9 mg/dL (ref 8.4–10.5)
GFR calc non Af Amer: 7 mL/min — ABNORMAL LOW (ref >60)
Glucose, Bld: 123 mg/dL — ABNORMAL HIGH (ref 70–99)

## 2011-09-09 LAB — CBC
HCT: 27.9 % — ABNORMAL LOW (ref 39.0–52.0)
Hemoglobin: 9 g/dL — ABNORMAL LOW (ref 13.0–17.0)
MCH: 29.3 pg (ref 26.0–34.0)
MCHC: 32.3 g/dL (ref 30.0–36.0)
MCV: 90.9 fL (ref 78.0–100.0)
RBC: 3.07 MIL/uL — ABNORMAL LOW (ref 4.22–5.81)

## 2011-09-09 LAB — GLUCOSE, CAPILLARY: Glucose-Capillary: 149 mg/dL — ABNORMAL HIGH (ref 70–99)

## 2011-09-10 ENCOUNTER — Inpatient Hospital Stay (HOSPITAL_COMMUNITY): Payer: Medicare Other

## 2011-09-10 LAB — CBC
HCT: 25.7 % — ABNORMAL LOW (ref 39.0–52.0)
MCHC: 32.7 g/dL (ref 30.0–36.0)
MCV: 89.9 fL (ref 78.0–100.0)
Platelets: 319 10*3/uL (ref 150–400)
RDW: 14.2 % (ref 11.5–15.5)
WBC: 7.5 10*3/uL (ref 4.0–10.5)

## 2011-09-10 LAB — RENAL FUNCTION PANEL
Albumin: 2.3 g/dL — ABNORMAL LOW (ref 3.5–5.2)
BUN: 34 mg/dL — ABNORMAL HIGH (ref 6–23)
Chloride: 98 mEq/L (ref 96–112)
Creatinine, Ser: 10.24 mg/dL — ABNORMAL HIGH (ref 0.50–1.35)
GFR calc non Af Amer: 6 mL/min — ABNORMAL LOW (ref >60)
Phosphorus: 4.4 mg/dL (ref 2.3–4.6)
Potassium: 4.2 mEq/L (ref 3.5–5.1)

## 2011-09-10 LAB — GLUCOSE, CAPILLARY
Glucose-Capillary: 131 mg/dL — ABNORMAL HIGH (ref 70–99)
Glucose-Capillary: 196 mg/dL — ABNORMAL HIGH (ref 70–99)

## 2011-09-15 NOTE — Discharge Summary (Signed)
Physician Discharge Summary  Patient ID: [name] MRN: [id number] DOB/AGE: May 30, 1976 35 y.o.  Admit date: 09/04/2011 Discharge date: 09/10/2011  Admission Diagnoses: Rule out stroke, shoulder pain, N/V  Discharge Diagnoses: Accelerated hypertension, Acute pulmonary edema as seen on CXR, DM type 1, ESRD started on HD, Anemia, Depression, HLD, bilateral BKA, gastroparesis   Discharged Condition: stable  Hospital Course: Pt presented to the ED for severe right shoulder pain radiating to his chest and TIA-like symptoms, he was found to have SBP >200 and CXR concerning for acute pulmonary edema. 1. HTN: Received Labetalol in the ED with no change in his blood pressure. It took a while to get his blood pressures down, but given the concern for TIA/Stroke, it was ideal to keep his blood pressure high and not drop it too quickly. Throughout his hospital admission, we continued to monitor his blood pressures which improved after starting dialysis. 2. R/O TIA: Pt had weakness of the right arm and hand on admission, and was complaining of some slurred speech/blurry vision. Pt had a MRI without contrast which was negative. Carotid dopplers were negative. No other episodes of slurred speech, etc. Pt does have right shoulder pathology and some atrophy noted of his right hand, therefore it is thought his weakness is a chronic process rather than an acute stroke. Pt was started on Plavix during the hospital admission, but that was not continued at discharge.  3. DM: History of uncontrolled Type 1 DM. He has B/L BKA and ESRD. His blood sugars were very well controlled as an inpatient on Lantus and SSI. He will continue his home regimen at discharge. 4. ESRD: Pt had a fistula placed in august in preparation for HD. The graft was evaluated and deemed appropriate for use for hemodialysis now. His K was high, blood pressure was high, and patient was anemia. His BUN/Cr at admission were 84/15.42. Renal was  consulted and they began his hemodialysis during this admission. He had multiple days in a row of hemodialysis. His anemia improved as well as his electrolytes. He was arranged to go to outpatient dialysis. Social work and case management worked to get everything arranged for him. 5. Gastroparesis: Pt had N/V for multiple days. Given his DM and history of gastroparesis, it is likely that he has it again. He was started on Phenergan for nausea, Protonix BID and Reglan 10mg  with meals and bedtime for a few weeks with the recommendation to cut back to just bedtime for a month then hopefully he will not require it. This will need to be adjusted as an outpatient.  6. Shoulder pain: Unclear etiology. Neck CT was wnl. Pt does have tenderness and decreased range of motion. Will recommend for Dr. Luberta Mutter to continue to follow this as an outpatient and consider sports med referral. 7. Acute pulmonary edema as seen on chest Xray: Likely secondary to fluid overload. Improved after initiation of dialysis.    Consults: nephrology and neurology  Significant Diagnostic Studies: CBC    Component Value Date/Time   WBC 7.5 09/10/2011 1207   RBC 2.86* 09/10/2011 1207   HGB 8.4* 09/10/2011 1207   HCT 25.7* 09/10/2011 1207   PLT 319 09/10/2011 1207   MCV 89.9 09/10/2011 1207   MCH 29.4 09/10/2011 1207   MCHC 32.7 09/10/2011 1207   RDW 14.2 09/10/2011 1207   LYMPHSABS 1.4 09/04/2011 1528   MONOABS 0.4 09/04/2011 1528   EOSABS 0.4 09/04/2011 1528   BASOSABS 0.1 09/04/2011 1528    BMET  Component Value Date/Time   NA 135 09/10/2011 1206   K 4.2 09/10/2011 1206   CL 98 09/10/2011 1206   CO2 27 09/10/2011 1206   GLUCOSE 202* 09/10/2011 1206   BUN 34* 09/10/2011 1206   CREATININE 10.24* 09/10/2011 1206   CALCIUM 8.6 09/10/2011 1206   CALCIUM 8.4 09/08/2011 0746   GFRNONAA 6* 09/10/2011 1206   GFRAA 7* 09/10/2011 1206    Radiology: CXR (09/04/11)IMPRESSION:  Bilateral interstitial opacities, likely reflecting interstitial    edema, less likely atypical infection. Suspected small right  pleural effusion.  Cardiomegaly.   Brain MRI (09/06/11) IMPRESSION:  No acute intracranial findings.  Unusual prominence of the intracerebral vessels on gradient  sequence likely artifactual.  Abnormal marrow signal reflects anemia.   Neck MRI (09/06/11) IMPRESSION:  Abnormal marrow signal consistent with chronic anemia. No disc  protrusion, spinal stenosis, or nerve root encroachment.    Treatments: Hemodialysis started  Disposition: Discharged home with a friend. Pt will have a home health RN to help with medications and other health needs. There is an agency working with him to get personal care services, but this is a long process. Pt will continue outpatient HD on a T/Th/S schedule.  *Please follow-up his blood pressures, if he is going to dialysis, and consider more extensive shoulder work-up  Discharge Orders    Future Appointments: Provider: Department: Dept Phone: Center:   09/16/2011 8:45 AM Dorthey Sawyer [facility] [phone] [place]        Signed: Melrose Nakayama 09/15/2011, 6:43 PM

## 2011-09-16 ENCOUNTER — Inpatient Hospital Stay: Payer: Medicare Other | Admitting: Family Medicine

## 2011-09-16 LAB — TYPE AND SCREEN: ABO/RH(D): O POS

## 2011-09-16 LAB — RETICULOCYTES
RBC.: 3.2 — ABNORMAL LOW
RBC.: 3.63 — ABNORMAL LOW
Retic Count, Absolute: 29
Retic Ct Pct: 0.7
Retic Ct Pct: 0.8

## 2011-09-16 LAB — DIFFERENTIAL
Basophils Relative: 0
Lymphocytes Relative: 7 — ABNORMAL LOW
Monocytes Relative: 5
Neutro Abs: 10.5 — ABNORMAL HIGH
Neutrophils Relative %: 86 — ABNORMAL HIGH

## 2011-09-16 LAB — BASIC METABOLIC PANEL
BUN: 4 — ABNORMAL LOW
BUN: 7
Calcium: 7.9 — ABNORMAL LOW
Calcium: 8 — ABNORMAL LOW
Calcium: 8.4
Creatinine, Ser: 1.36
GFR calc Af Amer: >60
GFR calc non Af Amer: >60
GFR calc non Af Amer: >60
GFR calc non Af Amer: >60
Glucose, Bld: 295 — ABNORMAL HIGH
Potassium: 3.1 — ABNORMAL LOW
Sodium: 131 — ABNORMAL LOW
Sodium: 132 — ABNORMAL LOW

## 2011-09-16 LAB — COMPREHENSIVE METABOLIC PANEL
Alkaline Phosphatase: 107
BUN: 10
Calcium: 8.1 — ABNORMAL LOW
Creatinine, Ser: 1.47
Glucose, Bld: 531
Total Protein: 7.2

## 2011-09-16 LAB — CBC
HCT: 28 — ABNORMAL LOW
HCT: 30.7 — ABNORMAL LOW
Hemoglobin: 8.4 — ABNORMAL LOW
Hemoglobin: 9.1 — ABNORMAL LOW
MCHC: 32.5
MCV: 83.4
Platelets: 641 — ABNORMAL HIGH
RBC: 3.03 — ABNORMAL LOW
RDW: 13.4
RDW: 13.8
WBC: 15.6 — ABNORMAL HIGH

## 2011-09-16 LAB — BLOOD GAS, ARTERIAL
Drawn by: 273391
FIO2: 0.21
O2 Saturation: 96.6
Patient temperature: 98.6
pO2, Arterial: 79.9 — ABNORMAL LOW

## 2011-09-16 LAB — HEMOGLOBIN A1C: Mean Plasma Glucose: 485

## 2011-09-16 LAB — ABO/RH: ABO/RH(D): O POS

## 2011-09-16 LAB — OCCULT BLOOD X 1 CARD TO LAB, STOOL: Fecal Occult Bld: NEGATIVE

## 2011-09-19 NOTE — H&P (Signed)
Andrew, Caldwell NO.:  [id number]  MEDICAL RECORD NO.:  [id number]  LOCATION:  3302                         FACILITY:  [facility]  PHYSICIAN:  Jamal Collin. Joeline Freer, M.D.DATE OF BIRTH:  Jan 15, 1976  DATE OF ADMISSION:  09/04/2011 DATE OF DISCHARGE:                             HISTORY & PHYSICAL   PRIMARY CARE PHYSICIAN:  Dorthey Sawyer, MD at [facility].  CHIEF COMPLAINT:  Slurring of speech and vision changes.  HISTORY OF PRESENT ILLNESS:  [name] is a 35 year old male with multiple chronic conditions including past medical history significant for uncontrolled diabetes mellitus type 1 and end-stage renal disease, but not yet on dialysis.  He is presenting with symptoms concerning for TIA even though his chief complaint is actually right shoulder pain. The patient states that for the past several days, he has had increasing right shoulder pain, radiating in to his arm as well as right-sided weakness.  Pain has been controlled with oxycodone in the past which he was prescribed following recent hospital discharge.  He describes his weakness as generalized weakness from his neck down his right side to his leg and not acute.  Today, after he awoke, he went outside this a.m., he described immediate blurry vision.  Although he did not notice difference in his speech, his girlfriend told him that he was slurring his speech.  He denied any acute weakness, fall, syncopal episodes at that time.  This episode lasted for about an hour, after which his vision cleared and his girlfriend said his speech had returned to normal.  As the day continued, he began experiencing vague chest pain and worsening shortness of breath.  He has also been complaining of dyspnea and cough with productive white sputum for the past week or so. He has also had nausea without vomiting which started today.  Due to his girlfriend's concerns for his symptoms and with increasing  chest pain as well as his right arm pain, he finally came to the emergency department this afternoon.  In the emergency department, his blood pressure was found to be A999333 systolic over [id number] diastolic.  He was given 10 mg of labetalol with no change in his blood pressure and Family Practice Teaching Service was called for admission.  PAST MEDICAL HISTORY: 1. Diabetes mellitus type 1, uncontrolled. 2. End-stage renal disease with a left arm fistula placed on August 18, 2011. 3. Hypertension. 4. GERD. 5. Major depression. 6. Hyperlipidemia. 7. Anemia. 8. Bilateral below-knee amputations.  PAST SURGICAL HISTORY: 1. Cataract surgery. 2. Bilateral below-knee amputations.  SOCIAL HISTORY:  The patient lives with his girlfriend at home.  He denies any current drug use.  He does admit to occasional alcohol use. Denies any cigarette use.  ALLERGIES:  No known allergies.  MEDICATIONS: 1. Norvasc 10 mg p.o. b.i.d. 2. PhosLo 1334 mg p.o. t.i.d. with meals. 3. Hydralazine 25 mg p.o. 4 times a day. 4. Labetalol 200 mg 2 tabs p.o. b.i.d. 5. Zofran 4 mg q.8 hours as needed for nausea. 6. Oxycodone 5 mg 1-2 tabs every 6 hours as needed for pain. 7. Simvastatin 10 mg by mouth daily. 8. Lantus 2 units subcu at bedtime.  9. Aspirin 81 mg p.o. daily. 10.Clonidine 0.1 mg p.o. b.i.d. 11.Gabapentin 300 mg 1 tab p.o. b.i.d. 12.Nexium 1 capsule p.o. daily. 13.NovoLog FlexPen 2-10 units subcu t.i.d. before meals. 14.Sertraline 50 mg p.o. daily. 15.Wellbutrin 100 mg p.o. b.i.d.  REVIEW OF SYSTEMS:  A 12-point review of systems completely negative except for HPI.  PHYSICAL EXAMINATION:  VITAL SIGNS:  Pulse 87, blood pressure 205/101, respiratory rate 17, oxygen sat 100% on room air, temperature is 98. GENERAL:  He is alert, cooperative, appears stated age, in no distress. HEENT:  Pupils equal and reactive to light.  Extraocular movements intact.  Head normocephalic, atraumatic.  Sclerae  clear, anicteric. Oropharynx clear without lesions.  Upper dentures are in place.  It is difficult to assess his funduscopy.  Mucous membranes moist. NECK:  No lymphadenopathy noted. HEART:  Regular rate and rhythm with 1/6 systolic ejection murmur heard throughout his precordium. LUNGS:  Rales noted in bilateral lung bases, right greater than left. No wheezing. ABDOMEN:  Soft, nondistended, nontender.  No guarding or rebound. EXTREMITIES:  Status post bilateral BKA.  Stump is well healed. SKIN:  No rashes, no ecchymoses.  Numerous tattoos scattered throughout. NEUROLOGIC:  Alert and oriented x3.  Mental status and speech normal. Cranial nerves II-XII intact.  Reflexes are symmetric and sensation grossly normal throughout.  Strength is 5/5 except for his right upper extremity which is 4/5.  Testing seems to be limited by pain.  Rapid alternating movements within normal limits.  Finger-to-nose within normal limits. MUSCULOSKELETAL:  The patient refused testing of his right shoulder secondary to pain.  LABS:  Point-of-care testing reveals sodium to be 136, potassium 5.5, chloride 101, CO2 of 19, BUN 84, creatinine 15.42, glucose 155.  Point- of-care CBC showed white blood cells of 8.1, hemoglobin 8.4, hematocrit 25, platelets 380.  IMAGING: 1. Chest x-ray shows bilateral interstitial edema with suspected right     pleural effusion. 2. CT of his head was negative for acute pathology.  ASSESSMENT AND PLAN:  [name] is a 35 year old male, presenting with slurred speech and vision changes, which have now resolved.  He was also complained of right-sided pain and weakness in several days' duration.  He is being admitted for transient ischemic attack rule out primarily:  1. Transient ischemic attack rule out.  Plan to admit the patient for     transient ischemic attack rule out.  CT of his head was negative     for bleed which was concern with the patient's hypertensive      urgency.  Plan to admit the patient to step-down unit and obtain     carotid Dopplers, MRI, MRA of his head.  The patient's symptoms     appeared to be transient ischemic attack rather than stroke as his     slurring of speech and vision changes which occurred earlier today,     only lasted for about an hour and have now resolved.  He states he     has had increasing pain and weakness in his entire right side for     the past 4-5 days, but not conforming to any particular     neurological distribution.  Less likely he has had retinal     detachment as he denies any eye pain, changes in vision currently,     has no conjunctival injection and has an afferent pupillary reflex     present.  I will keep the patient on aspirin 325 mg a day as he  has     already passed bedside swallow. 2. Chest pain likely secondary to increasing pulmonary edema.  We must     also rule out cardiac etiology for pain.  Only EKG change noted is     T-waves in V1.  Plan to obtain full set of cardiac enzymes cycled     x2. 3. Hypertensive urgency.  The patient is status post 10-mg labetalol     in the ED followed by 20-mg labetalol.  We will need to assess for     any blood pressure changes as he just received 20-mg labetalol.  I     will also need to obtain MRI to assess for any neurological damage.     If damage is present, will be less aggressive in blood pressure     lowering. 4. Renal failure.  It is hard to tell if he is having acute episode.     He is at end-stage renal disease.  His creatinine on admission and     at discharge from last hospitalization was 15 and is 15.32 today.     Again, although he does appear volume overloaded, please see below,     he is not in acute distress at this time.  Plan to consult Renal     for further input.  The patient had not been hypoxic since coming     to the ED.  He was started on PhosLo for hyperphosphatemia last     admission, we plan to continue this. 5. Pulmonary  edema, several possible etiologies:  Overload secondary     to cardiopulmonary disease versus renal disease which is most     likely.  We have stopped his IV fluids which he was receiving in     the ED.  He had received 500 mL bolus in the ED.  Plan to do Lasix     challenge if the patient does admit to being able to produce his     urine.  Plan to start Lasix 160 mg p.o. x1 dose now q.8 hours.     Though he is overloaded on exam and by radiologic imaging, he is     not having any acute need for or dialysis.  He may be started on     dialysis this admission for which the patient will need portable     access site as his fistula was just placed on August 18, 2011. 6. Anemia.  Hemoglobin baseline appears between 8 and 9 for the past     several months.  Prior to this, his baseline appeared to be around     10.  Point-of-care testing shows his hemoglobin is 8.5 today.  Plan     to obtain stat CBC as point-of-care testing is often incorrect.     Likely combination of anemia of chronic disease as well as iron     deficiency.  As the patient is in end-stage renal disease,     parenteral iron may be the better source for him to replete his     iron stores. 7. Diabetes mellitus type 1, uncontrolled.  The patient was last     discharged home on Lantus 2 units subcu daily as well as sliding     scale insulin.  Plan to cover sliding scale insulin 2 units Lantus     while in-house.  We will obtain q.a.c. at bedtime CBGs. 8. Depression.  Continue sertraline and Wellbutrin. 9. Hyperlipidemia.  Continue some pravastatin.  10.Hyperkalemia.  The patient's potassium is 5.5 in the ED, point-of-     care testing.  No EKG changes noted except for flipped T-waves as     noted above.  We will obtain stat labs to further evaluate and     treat accordingly. 11.Fluids, electrolytes, and nutrition/gastrointestinal.  Place the     patient on renal diet. 12.Prophylaxis.  Lovenox for transient ischemic attack, admit  orders.  DISPOSITION:  Pending further improvement.     Annabell Sabal, MD   ______________________________ Jamal Collin Andria Frames, M.D.    JW/MEDQ  D:  09/04/2011  T:  09/05/2011  Job:  [id number]  Electronically Signed by Annabell Sabal  on 09/14/2011 04:16:10 PM Electronically Signed by Madison Hickman M.D. on 09/19/2011 09:05:13 AM

## 2011-09-21 ENCOUNTER — Telehealth: Payer: Self-pay | Admitting: Family Medicine

## 2011-09-21 NOTE — Telephone Encounter (Addendum)
Needs rx for his for his prosthesis  - bilateral BK supplies Faxed to [employer] lab - [phone]

## 2011-09-21 NOTE — Telephone Encounter (Signed)
Will forward to Dr Luberta Mutter

## 2011-09-22 NOTE — Discharge Summary (Signed)
[name], [name] NO.:  [id number]  MEDICAL RECORD NO.:  [id number]  LOCATION:  [id number]                         FACILITY:  [facility]  PHYSICIAN:  Dalbert Mayotte, MD        DATE OF BIRTH:  [birth year]  DATE OF ADMISSION:  09/04/2011 DATE OF DISCHARGE:  09/10/2011                              DISCHARGE SUMMARY   PRIMARY CARE PROVIDER:  Dorthey Sawyer, MD, [facility]  ADMISSION DIAGNOSES: 1. Rule out stroke. 2. Shoulder pain. 3. Nausea and vomiting.  DISCHARGE DIAGNOSES: 1. Accelerated hypertension. 2. Acute pulmonary edema as seen on chest x-ray. 3. Type 1 diabetes. 4. End-stage renal disease (started on hemodialysis during hospital     admission). 5. Anemia. 6. Depression. 7. Hyperlipidemia. 8. Bilateral below-knee amputations. 9. Gastroparesis.  DISCHARGE CONDITION:  Stable.  HOSPITAL COURSE:  The patient presented to the emergency department for severe right shoulder pain radiating to his chest and TIA-like symptoms. He was found to have a systolic blood pressure greater than 200 and a chest x-ray concerning for pulmonary edema. 1. Hypertension.  The patient had accelerated hypertension at     presentation.  He received labetalol in the emergency department     with no immediate change in his blood pressure.  It did take a     while to get his blood pressure down but given the concern for     TIA/stroke, it was ideal to keep his blood pressure a little higher     and not drop it too quickly.  Throughout his hospital admission, we     continued to monitor his blood pressures which improved after     starting dialysis. 2. Rule out TIA.  The patient had weakness of the right arm and hand     on admission and was complaining of some slurred speech and blurry     vision.  The patient had an MRI without contrast which was     negative.  Carotid Dopplers were negative and echo was negative.     No other episodes of slurred speech, etc.  throughout hospital stay.     The patient does have some type of right shoulder pathology and     some atrophy noted of his right hand.  Therefore, it was felt his     weakness is a chronic process rather than an acute stroke.  The     patient was started on Plavix during the hospital admission but     that was not continued at discharge. 3. Diabetes.  The patient has a history of uncontrolled type 1     diabetes.  He has bilateral BKA and end-stage renal disease.  His     blood sugars were very well controlled as an inpatient on Lantus     and sliding scale insulin.  He will continue his home regimen at     discharge. 4. End-stage renal disease.  The patient had a fistula placed in     August in preparation for hemodialysis to begin.  The graft was     evaluated and deemed appropriate for hemodialysis now.  His     potassium was high, his  blood pressure was high, and the patient     was anemic at presentation.  His BUN and creatinine at admission     were 84 and 15.42.  Renal was consulted and they began his     hemodialysis during this admission.  He had multiple days in rows     of hemodialysis.  His anemia improved as well as his electrolytes.     He was arranged to get a outpatient dialysis.  Social Work and Case     Management were to get everything arranged for him and he will go     to outpatient dialysis on a Tuesday, Thursday, Saturday schedule. 5. Gastroparesis.  The patient had nausea and vomiting for multiple     days.  He was on Zofran with little relief.  Given his diabetes and     a history of gastroparesis, it is likely that he has it again.  He     was started on Phenergan for nausea, Protonix b.i.d., and Reglan 10     mg with meals and at bedtime for a few weeks with a recommendation     to cut-back to just at bedtime for a month and then hopefully he     will not require it any longer.  This will need to be adjusted as     an outpatient. 6. Shoulder pain, unclear  etiology.  The patient had a neck CT which     was within normal limits.  The patient does have tenderness to bony     prominences and decreased range of motion.  We will recommend for     Dr. Luberta Mutter to continue to follow him as an outpatient and     possibly consider Sports Medicine referral. 7. Acute pulmonary edema as seen on chest x-ray, likely secondary to     fluid overload, improved with initiation of dialysis.  CONSULTATIONS:  Nephrology and Neurology.  SIGNIFICANT DIAGNOSTIC STUDIES:  At discharge, CBC showed white blood cell count of 7.5, hemoglobin 8.4, hematocrit 25.7, and platelets 319. His BMET showed sodium 135, potassium 4.2, chloride 98, CO2 27, BUN 34, creatinine 10.2, and a glucose of 202.  RADIOLOGIC DATA: 1. Chest x-ray on September 04, 2011 showed bilateral interstitial     opacities, likely reflecting interstitial edema, less likely     atypical infection, and suspected small right pleural effusion. 2. Brain MRI on September 08, 2011 showed no acute intracranial     findings and unusual prominence of the intracerebral vessels on     gradient sequence likely artifactual and abnormal marrow signal     reflecting anemia. 3. Neck MRI on September 07, 2011 showed abnormal marrow signal     consistent with chronic anemia.  No disk protrusion, spinal     stenosis, or nerve root encroachment.  TREATMENT:  The patient began hemodialysis.  DISPOSITION:  The patient discharged home with a friend.  He will have Home Health RN to help him with medications and other health needs. There is an agency working with him to get personal care services but this is a long process.  The patient will continue on hemodialysis as a Tuesday, Thursday, Saturday schedule.  For outpatient followup, please follow up his blood pressures if he is going to dialysis and consider more extensive shoulder workup.   ______________________________ Melrose Nakayama,  MD   ______________________________ Dalbert Mayotte, MD   AH/MEDQ  D:  09/17/2011  T:  09/17/2011  Job:  [id number]  Electronically Signed by Melrose Nakayama  on 09/19/2011 02:17:33 PM Electronically Signed by Dalbert Mayotte MD on 09/22/2011 03:02:50 PM

## 2011-09-23 NOTE — Telephone Encounter (Signed)
I do not know what supplies he needs.  He will need to ask [employer] to fax me a request form with the needed supplies listed.  Pt also needs to schedule a follow up appointment. ( he did not show for his last appointment).  Will you please call pt and notify of the above information?  Thanks, [employer]

## 2011-09-26 NOTE — Telephone Encounter (Signed)
[employer] will be sending request and patient has an appt this Friday 10/5

## 2011-09-30 ENCOUNTER — Ambulatory Visit (INDEPENDENT_AMBULATORY_CARE_PROVIDER_SITE_OTHER): Payer: Medicare Other | Admitting: Family Medicine

## 2011-09-30 ENCOUNTER — Encounter: Payer: Self-pay | Admitting: Family Medicine

## 2011-09-30 DIAGNOSIS — R112 Nausea with vomiting, unspecified: Secondary | ICD-10-CM

## 2011-09-30 DIAGNOSIS — D649 Anemia, unspecified: Secondary | ICD-10-CM

## 2011-09-30 DIAGNOSIS — T8789 Other complications of amputation stump: Secondary | ICD-10-CM

## 2011-09-30 DIAGNOSIS — F4310 Post-traumatic stress disorder, unspecified: Secondary | ICD-10-CM

## 2011-09-30 DIAGNOSIS — N184 Chronic kidney disease, stage 4 (severe): Secondary | ICD-10-CM

## 2011-09-30 DIAGNOSIS — I10 Essential (primary) hypertension: Secondary | ICD-10-CM

## 2011-09-30 DIAGNOSIS — M79609 Pain in unspecified limb: Secondary | ICD-10-CM

## 2011-09-30 NOTE — Assessment & Plan Note (Signed)
Anemia followed by renal M.D.-Patient on dialysis

## 2011-09-30 NOTE — Patient Instructions (Signed)
Return to see me as needed- at least every 6-12 months.--Your renal doctor at dialysis will be managing your medications.  They can also help you with any acute medical issue that arises.  I am here to support you in any way.  Continue to stay motivated!  It is great to see your Blood pressure improvement!

## 2011-09-30 NOTE — Assessment & Plan Note (Signed)
I do not have updated medication list for patient. Patient did not bring to appointment. Will request records from renal M.D. to update med list.

## 2011-09-30 NOTE — Assessment & Plan Note (Signed)
Patient stopped taking Neurontin stating that it does not help him. Renal M.D. to follow pain.

## 2011-09-30 NOTE — Assessment & Plan Note (Signed)
Patient states the symptom is improved now that he is on dialysis.

## 2011-09-30 NOTE — Assessment & Plan Note (Signed)
Patient refuses therapy at this time. Continue Wellbutrin and Zoloft.

## 2011-09-30 NOTE — Assessment & Plan Note (Signed)
Patient on dialysis-going Tuesday, Thursdays and Saturdays.

## 2011-09-30 NOTE — Progress Notes (Signed)
  Subjective:    Patient ID: [name], male    DOB: 1976/01/02, 35 y.o.   MRN: [id number]  HPI Hospital followup:  Dialysis: Patient going to dialysis on Tuesdays, Thursdays and Saturdays. Patient states that medications have changed by renal doctors does not have updated list. Does not know medications by memory. Has been going faithfully to dialysis has not missed appointments. Has an access in left upper arm.  Hypertension: Managed by renal physicians. Blood pressure improved from past readings-158/89. Patient states with dialysis systolic blood pressures in the 130s.  Stump pain: Off and on still has discomfort. Has not been started on any new medications for this. Stopped taking Neurontin because he felt it was not helping.  Depression: PHQ9 score of 0. Patient states his depression is well controlled with Wellbutrin and Zoloft. Patient states he does not feel like he needs therapy at this time. He denies SI or HI.  Medication refill: Patient requesting refills for her Nexium, aspirin, and Wellbutrin   Review of Systems    as per above Objective:   Physical Exam  Constitutional: He is oriented to person, place, and time. He appears well-developed and well-nourished.  Cardiovascular: Normal rate.   Pulmonary/Chest: Effort normal. No respiratory distress.  Musculoskeletal:       Bilateral BKA. Prosthesis in place. Dialysis access site in the left upper arm.  Neurological: He is alert and oriented to person, place, and time.  Skin: Skin is warm.  Psychiatric: He has a normal mood and affect. His behavior is normal.          Assessment & Plan:

## 2011-10-02 ENCOUNTER — Emergency Department (HOSPITAL_COMMUNITY)
Admission: EM | Admit: 2011-10-02 | Discharge: 2011-10-02 | Disposition: A | Discharge status: Home / Self Care | Payer: Medicare Other | Attending: Emergency Medicine | Admitting: Emergency Medicine

## 2011-10-02 DIAGNOSIS — Z79899 Other long term (current) drug therapy: Secondary | ICD-10-CM | POA: Insufficient documentation

## 2011-10-02 DIAGNOSIS — E7800 Pure hypercholesterolemia, unspecified: Secondary | ICD-10-CM | POA: Insufficient documentation

## 2011-10-02 DIAGNOSIS — S88119A Complete traumatic amputation at level between knee and ankle, unspecified lower leg, initial encounter: Secondary | ICD-10-CM | POA: Insufficient documentation

## 2011-10-02 DIAGNOSIS — Z794 Long term (current) use of insulin: Secondary | ICD-10-CM | POA: Insufficient documentation

## 2011-10-02 DIAGNOSIS — I739 Peripheral vascular disease, unspecified: Secondary | ICD-10-CM | POA: Insufficient documentation

## 2011-10-02 DIAGNOSIS — I10 Essential (primary) hypertension: Secondary | ICD-10-CM | POA: Insufficient documentation

## 2011-10-02 DIAGNOSIS — R404 Transient alteration of awareness: Secondary | ICD-10-CM | POA: Insufficient documentation

## 2011-10-02 DIAGNOSIS — E1169 Type 2 diabetes mellitus with other specified complication: Secondary | ICD-10-CM | POA: Insufficient documentation

## 2011-10-02 LAB — POCT I-STAT, CHEM 8
Chloride: 102 mEq/L (ref 96–112)
Glucose, Bld: 140 mg/dL — ABNORMAL HIGH (ref 70–99)
HCT: 39 % (ref 39.0–52.0)
Hemoglobin: 13.3 g/dL (ref 13.0–17.0)
Potassium: 4.1 mEq/L (ref 3.5–5.1)
Sodium: 136 mEq/L (ref 135–145)

## 2011-10-02 LAB — GLUCOSE, CAPILLARY: Glucose-Capillary: 101 mg/dL — ABNORMAL HIGH (ref 70–99)

## 2011-10-03 LAB — GLUCOSE, CAPILLARY
Comment 1: 4444
Glucose-Capillary: 58 mg/dL — ABNORMAL LOW (ref 70–99)

## 2011-10-04 ENCOUNTER — Telehealth: Payer: Self-pay | Admitting: *Deleted

## 2011-10-04 NOTE — Telephone Encounter (Signed)
[name] is calling from the [place]  requesting a referral to [facility] for [name] for right shoulder pain.  Carney Living I would forward this information on to Dr. Luberta Mutter to see if she would make the referral or if the patient would need to see her first.  Lauralyn Primes

## 2011-10-04 NOTE — Telephone Encounter (Signed)
I would be glad to evaluate this pt's shoulder.  I have never seen him for this complaint. Please have him schedule an appointment.

## 2011-10-04 NOTE — Telephone Encounter (Signed)
Appointment scheduled to see Dr. Luberta Mutter 10/12/2011 @ 2:30pm. Lauralyn Primes

## 2011-10-07 ENCOUNTER — Telehealth: Payer: Self-pay | Admitting: *Deleted

## 2011-10-07 NOTE — Telephone Encounter (Signed)
[name] from [facility] calling to let Dr. Luberta Mutter know she was scheduled to see [name] today but he was not home and told her he would not be home until later this weekend.  They are scheduled to see him once a week so he will miss this weeks visit but they will plan on seeing him next week. Lauralyn Primes

## 2011-10-12 ENCOUNTER — Ambulatory Visit: Payer: Medicare Other | Admitting: Family Medicine

## 2011-11-14 NOTE — H&P (Signed)
  NAMEALVIA, MARK NO.:  [id number]  MEDICAL RECORD NO.:  [id number]           PATIENT TYPE:  I  LOCATION:  FOOT                         FACILITY:  [facility]  PHYSICIAN:  Judene Companion, M.D.     DATE OF BIRTH:  1976-01-13  DATE OF ADMISSION:  01/26/2011 DATE OF DISCHARGE:                             HISTORY & PHYSICAL   [name] is a 35 year old African American male who has been a diabetic since age 72.  He has had great trouble in the past controlling his blood glucose levels.  He also has hypertension and when he was about mid 34s, he had a BK amputation of his right leg and then several years ago had to have an amputation below the knee of his left leg. Today, he came here because he has a 1-cm ulcer on the stump of his left leg and I looked it over, it is nice and clean and it appears that it must have been caused by ill-fitting stump.  It is clean, did not need any debridement, and all we did today was wash it and then we put aPuracol dressing on it and instructed him to do this every day and he will come back here in a week for recheck.  He is on several medicines including Toprol-XL for hypertension.  He takes Lantus 50 units at bedtime and he uses NovoLog on a sliding scale. He is also on lisinopril/hydrochlorothiazide 20/25 a day.  He takes statin drugs, aspirin.  He is also on Norvasc and he is on Wellbutrin 100 mg three times a day.  We will see him in a week and hopefully we will get this healed up so they can start wearing his prosthesis.  We may even contact the prosthetic man and see if he can look at what he has to make sure that the prosthesis is fitted right.     Judene Companion, M.D.     PP/MEDQ  D:  01/26/2011  T:  01/27/2011  Job:  [id number]  Electronically Signed by Judene Companion  on 11/14/2011 01:50:46 PM

## 2011-12-10 ENCOUNTER — Emergency Department (HOSPITAL_COMMUNITY)
Admission: EM | Admit: 2011-12-10 | Discharge: 2011-12-10 | Disposition: A | Discharge status: Home / Self Care | Payer: Medicare Other | Attending: Emergency Medicine | Admitting: Emergency Medicine

## 2011-12-10 ENCOUNTER — Encounter (HOSPITAL_COMMUNITY): Payer: Self-pay | Admitting: *Deleted

## 2011-12-10 ENCOUNTER — Emergency Department (HOSPITAL_COMMUNITY): Payer: Medicare Other

## 2011-12-10 DIAGNOSIS — R0602 Shortness of breath: Secondary | ICD-10-CM | POA: Insufficient documentation

## 2011-12-10 DIAGNOSIS — E119 Type 2 diabetes mellitus without complications: Secondary | ICD-10-CM | POA: Insufficient documentation

## 2011-12-10 DIAGNOSIS — R42 Dizziness and giddiness: Secondary | ICD-10-CM | POA: Insufficient documentation

## 2011-12-10 DIAGNOSIS — F329 Major depressive disorder, single episode, unspecified: Secondary | ICD-10-CM | POA: Insufficient documentation

## 2011-12-10 DIAGNOSIS — R112 Nausea with vomiting, unspecified: Secondary | ICD-10-CM | POA: Insufficient documentation

## 2011-12-10 DIAGNOSIS — K219 Gastro-esophageal reflux disease without esophagitis: Secondary | ICD-10-CM | POA: Insufficient documentation

## 2011-12-10 DIAGNOSIS — Z794 Long term (current) use of insulin: Secondary | ICD-10-CM | POA: Insufficient documentation

## 2011-12-10 DIAGNOSIS — I120 Hypertensive chronic kidney disease with stage 5 chronic kidney disease or end stage renal disease: Secondary | ICD-10-CM | POA: Insufficient documentation

## 2011-12-10 DIAGNOSIS — N186 End stage renal disease: Secondary | ICD-10-CM | POA: Insufficient documentation

## 2011-12-10 DIAGNOSIS — E785 Hyperlipidemia, unspecified: Secondary | ICD-10-CM | POA: Insufficient documentation

## 2011-12-10 DIAGNOSIS — R109 Unspecified abdominal pain: Secondary | ICD-10-CM | POA: Insufficient documentation

## 2011-12-10 DIAGNOSIS — R10817 Generalized abdominal tenderness: Secondary | ICD-10-CM | POA: Insufficient documentation

## 2011-12-10 DIAGNOSIS — Z79899 Other long term (current) drug therapy: Secondary | ICD-10-CM | POA: Insufficient documentation

## 2011-12-10 DIAGNOSIS — F3289 Other specified depressive episodes: Secondary | ICD-10-CM | POA: Insufficient documentation

## 2011-12-10 DIAGNOSIS — S88119A Complete traumatic amputation at level between knee and ankle, unspecified lower leg, initial encounter: Secondary | ICD-10-CM | POA: Insufficient documentation

## 2011-12-10 LAB — CBC
Hemoglobin: 14.6 g/dL (ref 13.0–17.0)
MCH: 26.8 pg (ref 26.0–34.0)
Platelets: 209 10*3/uL (ref 150–400)
RBC: 5.44 MIL/uL (ref 4.22–5.81)
WBC: 6.1 10*3/uL (ref 4.0–10.5)

## 2011-12-10 LAB — URINALYSIS, ROUTINE W REFLEX MICROSCOPIC
Bilirubin Urine: NEGATIVE
Glucose, UA: >1000 mg/dL — AB
Ketones, ur: NEGATIVE mg/dL
Nitrite: NEGATIVE
Specific Gravity, Urine: 1.019 (ref 1.005–1.030)
pH: 7.5 (ref 5.0–8.0)

## 2011-12-10 LAB — POCT I-STAT, CHEM 8
BUN: 34 mg/dL — ABNORMAL HIGH (ref 6–23)
Calcium, Ion: 0.97 mmol/L — ABNORMAL LOW (ref 1.12–1.32)
Chloride: 97 mEq/L (ref 96–112)
HCT: 48 % (ref 39.0–52.0)
Sodium: 132 mEq/L — ABNORMAL LOW (ref 135–145)
TCO2: 31 mmol/L (ref 0–100)

## 2011-12-10 LAB — HEPATIC FUNCTION PANEL
ALT: 9 U/L (ref 0–53)
AST: 21 U/L (ref 0–37)
Albumin: 3.2 g/dL — ABNORMAL LOW (ref 3.5–5.2)
Alkaline Phosphatase: 122 U/L — ABNORMAL HIGH (ref 39–117)
Total Bilirubin: 0.3 mg/dL (ref 0.3–1.2)
Total Protein: 8.4 g/dL — ABNORMAL HIGH (ref 6.0–8.3)

## 2011-12-10 LAB — URINE MICROSCOPIC-ADD ON

## 2011-12-10 LAB — DIFFERENTIAL
Lymphocytes Relative: 32 % (ref 12–46)
Lymphs Abs: 1.9 10*3/uL (ref 0.7–4.0)
Monocytes Relative: 7 % (ref 3–12)
Neutro Abs: 3 10*3/uL (ref 1.7–7.7)
Neutrophils Relative %: 49 % (ref 43–77)

## 2011-12-10 LAB — TYPE AND SCREEN: Antibody Screen: NEGATIVE

## 2011-12-10 MED ORDER — ONDANSETRON HCL 4 MG/2ML IJ SOLN
4.0000 mg | Freq: Once | INTRAMUSCULAR | Status: AC
  Administered 2011-12-10: 4 mg via INTRAVENOUS
  Filled 2011-12-10: qty 2

## 2011-12-10 MED ORDER — ONDANSETRON 8 MG PO TBDP: 8.0000 mg | ORAL_TABLET | Freq: Three times a day (TID) | ORAL | Status: AC | PRN

## 2011-12-10 MED ORDER — PANTOPRAZOLE SODIUM 40 MG IV SOLR
40.0000 mg | Freq: Once | INTRAVENOUS | Status: AC
  Administered 2011-12-10: 40 mg via INTRAVENOUS
  Filled 2011-12-10: qty 40

## 2011-12-10 MED ORDER — SODIUM CHLORIDE 0.9 % IV SOLN
Freq: Once | INTRAVENOUS | Status: AC
  Administered 2011-12-10: 13:00:00 via INTRAVENOUS

## 2011-12-10 MED ORDER — DIPHENHYDRAMINE HCL 50 MG/ML IJ SOLN
25.0000 mg | Freq: Once | INTRAMUSCULAR | Status: AC
  Administered 2011-12-10: 25 mg via INTRAVENOUS
  Filled 2011-12-10: qty 1

## 2011-12-10 MED ORDER — MORPHINE SULFATE 4 MG/ML IJ SOLN
4.0000 mg | Freq: Once | INTRAMUSCULAR | Status: AC
  Administered 2011-12-10: 4 mg via INTRAVENOUS
  Filled 2011-12-10: qty 1

## 2011-12-10 MED ORDER — ONDANSETRON HCL 4 MG/2ML IJ SOLN
INTRAMUSCULAR | Status: AC
  Filled 2011-12-10: qty 2

## 2011-12-10 NOTE — ED Notes (Signed)
Pt arrived by [facility] for n/v since yesterday, pt reports coffee ground emesis and abd pain. Dialysis pt, last tx was Thursday. Received zofran 4mg  IM pta.

## 2011-12-10 NOTE — Progress Notes (Signed)
CSW received call from RN re: HD and Transportation  Per RN, pt was not dialyzed today rather the MD is requesting he be HD on Monday 12/12/11. Pts normal HD days are T - R- Sat @ [facility]. CSW met with pt to assess needs. Pt presented A/O x4 and easily engaged in conversation. Pt stated he is not able to take a bus to his HD 2/2 to his Bilateral Amputee. Pt confirms he has Medicaid Transportation on his scheduled HD day yet is not able to request transportation on a non HD day without authorization. This writer attempted to call pts HD Center yet the facility is closed for the day. CSW requested pt to call his HD Center @ 0600 on Monday and request to speak with the LCSW as he/she will be able to arrange transportation based on the urgent need 2/2 this ED visit. Pt was accepting of this plan yet was with some hesitation. After further exploring pts concerns, CSW requested pt to have the HD Center call the ED LCSW if needs or concerns are noted as this writer's colleague will be able to provide any needed information to corroborate the request.  Pt verbalized concern for transportation home today stating his family does not drive. Pt denied having any local friends etc to assist. CSW informed pt a one time voucher will be provided. Pt voiced appreciation and verbalized to understand the plan for Monday 12/17. Pt stated he presently does not feel well enough to go home. CSW informed pt his RN would be notified. No further CSW interventions identified. RN updated on above.  Rosario Adie MSW, [facility]. Weekend/Social Worker [phone]

## 2011-12-10 NOTE — ED Provider Notes (Signed)
History     CSN: [id number] Arrival date & time: 12/10/2011 11:37 AM   First MD Initiated Contact with Patient 12/10/11 1208      Chief Complaint  Patient presents with  . Nausea  . Emesis    (Consider location/radiation/quality/duration/timing/severity/associated sxs/prior treatment) HPI Comments: Patient who has been on dialysis for 2 months states he has been vomiting all week, which he states is normal for him prior to dialysis sessions, though yesterday his emesis became bloody and also like coffee grounds.  Pt reports an abnormal sensation in his abdomen that he describes as seasickness.  Associated lightheadedness and dizziness.  Pt vomited 4-5 times yesterday and once this morning around 2:30am.  He has since felt nauseated without vomiting.  BM 2 days ago was "runny" - unsure if there was blood in his stool.  Has also had shortness of breath for several days.  States he has had similar symptoms several years ago and was in DKA.  States his sugars have been high and low recently.  Denies fevers, recent illness, cough, sore throat, rash.  Last dialysis 2 days ago.    Patient is a 35 y.o. male presenting with vomiting. The history is provided by the patient.  Emesis  This is a new problem. The problem occurs 2 to 4 times per day. The emesis has an appearance of bright red blood and stomach contents (+coffee grounds). There has been no fever. Associated symptoms include abdominal pain. Pertinent negatives include no cough, no diarrhea, no fever and no URI.    Past Medical History  Diagnosis Date  . Renal insufficiency   . Diabetes mellitus   . Chronic kidney disease, stage 4, severely decreased GFR   . Hypertension   . GERD (gastroesophageal reflux disease)   . Gastropathy   . Dyslipidemia   . Chronic pain   . Depression   . Edema   . Headache   . Hyperlipidemia   . 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 Cataract surgery   .  Bilateral bka   . Below knee leg amputation     Family History  Problem Relation Age of Onset  . Diabetes Other   . Hypertension Other   . Heart disease Other     History  Substance Use Topics  . Smoking status: Never Smoker   . Smokeless tobacco: Not on file  . Alcohol Use: No      Review of Systems  Constitutional: Negative for fever.  Respiratory: Negative for cough.   Gastrointestinal: Positive for vomiting and abdominal pain. Negative for diarrhea.  All other systems reviewed and are negative.    Allergies  Review of patient's allergies indicates no known allergies.  Home Medications   Current Outpatient Rx  Name Route Sig Dispense Refill  . AMLODIPINE BESYLATE 5 MG PO TABS Oral Take 5 mg by mouth daily.     . ASPIRIN 81 MG PO TBEC Oral Take 81 mg by mouth daily.      . BUPROPION HCL ER (SR) 100 MG PO TB12 Oral Take 100 mg by mouth 3 (three) times daily.      [name] CLONIDINE HCL 0.1 MG PO TABS Oral Take 0.1 mg by mouth 2 (two) times daily.      [name] ESOMEPRAZOLE MAGNESIUM 40 MG PO CPDR Oral Take 40 mg by mouth at bedtime. One tab by mouth in the morning and then again at dinnertime    .  FUROSEMIDE 40 MG PO TABS Oral Take 0.5 tablets (20 mg total) by mouth 2 (two) times daily. 60 tablet 3  . GLUCAGON (RDNA) 1 MG IJ KIT  Follow package directions for low blood sugar. 1 each 1  . HYDRALAZINE HCL 25 MG PO TABS Oral Take 25 mg by mouth 4 (four) times daily.      . INSULIN ASPART 100 UNIT/ML [place] SOLN Subcutaneous Inject 5-10 Units into the skin 3 (three) times daily. Sliding scale    . INSULIN GLARGINE 100 UNIT/ML [place] SOLN Subcutaneous Inject 30 Units into the skin at bedtime.     [name] LABETALOL HCL 200 MG PO TABS Oral Take 400 mg by mouth 2 (two) times daily.      [name] METOCLOPRAMIDE HCL 10 MG PO TABS Oral Take 10 mg by mouth 4 (four) times daily.      [name] PROMETHAZINE HCL 12.5 MG PO TABS Oral Take 12.5 mg by mouth every 6 (six) hours as needed. nausea     . SERTRALINE HCL 50 MG PO  TABS Oral Take 1 tablet (50 mg total) by mouth daily. 30 tablet 6  . SIMVASTATIN 10 MG PO TABS Oral Take 10 mg by mouth at bedtime.      . SODIUM BICARBONATE 650 MG PO TABS Oral Take 650 mg by mouth 2 (two) times daily.      Glory Rosebush PING METER REMOTE SUPPLIES MISC Does not apply by Does not apply route. To be used qAC and HS for insulin dosing.     [name] ERGOCALCIFEROL 50000 UNITS PO CAPS Oral Take 50,000 Units by mouth once a week.      . INSULIN PEN NEEDLE 31G X 8 MM MISC Does not apply by Does not apply route. Inject 10 units prior to 3 meals per day if pre-meal blood glucose is higher than 120     . PRODIGY LANCETS 28G MISC Does not apply by Does not apply route. To be used qAC and HS for insulin dosing.       BP 188/104  Pulse 94  Temp(Src) 98.3 F (36.8 C) (Oral)  Resp 13  SpO2 100%  Physical Exam  Nursing note and vitals reviewed. Constitutional: He is oriented to person, place, and time. He appears well-developed and well-nourished.  HENT:  Head: Normocephalic and atraumatic.  Neck: Neck supple.  Cardiovascular: Normal rate, regular rhythm and normal heart sounds.   Pulmonary/Chest: Breath sounds normal. No respiratory distress. He has no wheezes. He has no rales. He exhibits no tenderness.  Abdominal: Soft. Bowel sounds are normal. He exhibits no distension and no mass. There is generalized tenderness. There is no rigidity, no rebound, no guarding and no CVA tenderness.  Musculoskeletal:       Left upper arm with AV fistula, with bruit and palpable thrill.   Neurological: He is alert and oriented to person, place, and time.  Skin: No rash noted.  Psychiatric: He has a normal mood and affect.    ED Course  Procedures (including critical care time)  Discussed patient with Dr Tomi Bamberger.    Labs Reviewed  DIFFERENTIAL - Abnormal; Notable for the following:    Eosinophils Relative 10 (*)    Basophils Relative 2 (*)    All other components within normal limits  HEPATIC  FUNCTION PANEL - Abnormal; Notable for the following:    Total Protein 8.4 (*)    Albumin 3.2 (*)    Alkaline Phosphatase 122 (*)    All other components  within normal limits  URINALYSIS, ROUTINE W REFLEX MICROSCOPIC - Abnormal; Notable for the following:    Glucose, UA >1000 (*)    Hgb urine dipstick SMALL (*)    Protein, ur >300 (*)    Leukocytes, UA SMALL (*)    All other components within normal limits  POCT I-STAT, CHEM 8 - Abnormal; Notable for the following:    Sodium 132 (*)    BUN 34 (*)    Creatinine, Ser 9.10 (*)    Glucose, Bld 253 (*)    Calcium, Ion 0.97 (*)    All other components within normal limits  CBC  LIPASE, BLOOD  TYPE AND SCREEN  URINE MICROSCOPIC-ADD ON  I-STAT, CHEM 8  URINE CULTURE   Dg Abd Acute W/chest  12/10/2011  *RADIOLOGY REPORT*  Clinical Data: Shortness of breath and vomiting  ACUTE ABDOMEN SERIES (ABDOMEN 2 VIEW & CHEST 1 VIEW)  Comparison: 08/12/2011  Findings: Heart size appears normal.  No pleural effusion or pulmonary edema.  No airspace consolidation.  The bowel gas pattern appears unremarkable.  There are no dilated loops of small bowel or air-fluid levels identified.  Gas and stool noted within the colon up to the rectum.  IMPRESSION:  1.  Nonobstructive bowel gas pattern. 2.  No active cardiopulmonary abnormalities.  Original Report Authenticated By: Angelita Ingles, M.D.   3:45 PM I have spoken both with [facility], with the patient's dialysis center and with Dr [name] Moore.  Dr [name] Moore has arranged for patient to get dialysis at 11:15am Monday at his dialysis center.  Plan is for d/c home with nausea medication.    1. End stage renal disease   2. Nausea and vomiting       MDM  Patient with end stage renal disease on dialysis (Tues, Thurs, Sat) with nausea and vomiting throughout the week followed by reported hematemesis x 5-6, last emesis 2:30am.  Patient's blood counts are normal, no vomiting in ED, symptoms controlled  with medication.  Plan is for d/c home with dialysis on Monday morning at 11:15.          [place], [place] 12/10/11 [phone]

## 2011-12-10 NOTE — ED Notes (Signed)
Have consulted social worker, pt states that he has no ride home to dialysis on monday

## 2011-12-10 NOTE — ED Notes (Signed)
Given soda

## 2012-01-31 ENCOUNTER — Encounter (HOSPITAL_COMMUNITY): Payer: Self-pay | Admitting: *Deleted

## 2012-01-31 ENCOUNTER — Emergency Department (HOSPITAL_COMMUNITY): Payer: Medicare Other

## 2012-01-31 ENCOUNTER — Observation Stay (HOSPITAL_COMMUNITY)
Admission: EM | Admit: 2012-01-31 | Discharge: 2012-02-01 | Disposition: A | Discharge status: Home / Self Care | Payer: Medicare Other | Source: Ambulatory Visit | Attending: Family Medicine | Admitting: Family Medicine

## 2012-01-31 DIAGNOSIS — E1165 Type 2 diabetes mellitus with hyperglycemia: Secondary | ICD-10-CM | POA: Diagnosis present

## 2012-01-31 DIAGNOSIS — K920 Hematemesis: Principal | ICD-10-CM | POA: Insufficient documentation

## 2012-01-31 DIAGNOSIS — I120 Hypertensive chronic kidney disease with stage 5 chronic kidney disease or end stage renal disease: Secondary | ICD-10-CM | POA: Insufficient documentation

## 2012-01-31 DIAGNOSIS — F329 Major depressive disorder, single episode, unspecified: Secondary | ICD-10-CM | POA: Insufficient documentation

## 2012-01-31 DIAGNOSIS — K219 Gastro-esophageal reflux disease without esophagitis: Secondary | ICD-10-CM | POA: Insufficient documentation

## 2012-01-31 DIAGNOSIS — N186 End stage renal disease: Secondary | ICD-10-CM | POA: Diagnosis present

## 2012-01-31 DIAGNOSIS — E109 Type 1 diabetes mellitus without complications: Secondary | ICD-10-CM | POA: Insufficient documentation

## 2012-01-31 DIAGNOSIS — R197 Diarrhea, unspecified: Secondary | ICD-10-CM | POA: Insufficient documentation

## 2012-01-31 DIAGNOSIS — R112 Nausea with vomiting, unspecified: Secondary | ICD-10-CM

## 2012-01-31 DIAGNOSIS — IMO0002 Reserved for concepts with insufficient information to code with codable children: Secondary | ICD-10-CM | POA: Diagnosis present

## 2012-01-31 DIAGNOSIS — Z992 Dependence on renal dialysis: Secondary | ICD-10-CM | POA: Insufficient documentation

## 2012-01-31 DIAGNOSIS — R109 Unspecified abdominal pain: Secondary | ICD-10-CM | POA: Insufficient documentation

## 2012-01-31 DIAGNOSIS — R42 Dizziness and giddiness: Secondary | ICD-10-CM | POA: Insufficient documentation

## 2012-01-31 DIAGNOSIS — S88119A Complete traumatic amputation at level between knee and ankle, unspecified lower leg, initial encounter: Secondary | ICD-10-CM | POA: Insufficient documentation

## 2012-01-31 DIAGNOSIS — E1143 Type 2 diabetes mellitus with diabetic autonomic (poly)neuropathy: Secondary | ICD-10-CM | POA: Diagnosis present

## 2012-01-31 DIAGNOSIS — F3289 Other specified depressive episodes: Secondary | ICD-10-CM | POA: Insufficient documentation

## 2012-01-31 LAB — URINE MICROSCOPIC-ADD ON

## 2012-01-31 LAB — CBC
MCH: 28.4 pg (ref 26.0–34.0)
MCV: 87 fL (ref 78.0–100.0)
Platelets: 172 10*3/uL (ref 150–400)
Platelets: 179 10*3/uL (ref 150–400)
RBC: 4.5 MIL/uL (ref 4.22–5.81)
RBC: 4.16 MIL/uL — ABNORMAL LOW (ref 4.22–5.81)
RDW: 18.3 % — ABNORMAL HIGH (ref 11.5–15.5)
WBC: 8.6 10*3/uL (ref 4.0–10.5)

## 2012-01-31 LAB — DIFFERENTIAL
Basophils Absolute: 0.1 10*3/uL (ref 0.0–0.1)
Eosinophils Relative: 4 % (ref 0–5)
Lymphocytes Relative: 14 % (ref 12–46)
Lymphs Abs: 1.2 10*3/uL (ref 0.7–4.0)
Neutrophils Relative %: 75 % (ref 43–77)

## 2012-01-31 LAB — POCT I-STAT 3, VENOUS BLOOD GAS (G3P V)
pCO2, Ven: 54.7 mmHg — ABNORMAL HIGH (ref 45.0–50.0)
pH, Ven: 7.318 — ABNORMAL HIGH (ref 7.250–7.300)
pO2, Ven: 55 mmHg — ABNORMAL HIGH (ref 30.0–45.0)

## 2012-01-31 LAB — URINALYSIS, ROUTINE W REFLEX MICROSCOPIC
Bilirubin Urine: NEGATIVE
Glucose, UA: 500 mg/dL — AB
Specific Gravity, Urine: 1.019 (ref 1.005–1.030)

## 2012-01-31 LAB — COMPREHENSIVE METABOLIC PANEL
ALT: 15 U/L (ref 0–53)
AST: 20 U/L (ref 0–37)
Alkaline Phosphatase: 195 U/L — ABNORMAL HIGH (ref 39–117)
CO2: 25 mEq/L (ref 19–32)
Calcium: 9.1 mg/dL (ref 8.4–10.5)
GFR calc Af Amer: 6 mL/min — ABNORMAL LOW (ref >90)
GFR calc non Af Amer: 5 mL/min — ABNORMAL LOW (ref >90)
Glucose, Bld: 357 mg/dL — ABNORMAL HIGH (ref 70–99)
Potassium: 4.8 mEq/L (ref 3.5–5.1)
Sodium: 133 mEq/L — ABNORMAL LOW (ref 135–145)
Total Protein: 8 g/dL (ref 6.0–8.3)

## 2012-01-31 LAB — GLUCOSE, CAPILLARY
Glucose-Capillary: 206 mg/dL — ABNORMAL HIGH (ref 70–99)
Glucose-Capillary: 188 mg/dL — ABNORMAL HIGH (ref 70–99)

## 2012-01-31 LAB — RAPID URINE DRUG SCREEN, HOSP PERFORMED
Barbiturates: NOT DETECTED
Benzodiazepines: NOT DETECTED
Cocaine: NOT DETECTED
Tetrahydrocannabinol: POSITIVE — AB

## 2012-01-31 LAB — PROTIME-INR
INR: 0.91 (ref 0.00–1.49)
Prothrombin Time: 12.4 seconds (ref 11.6–15.2)

## 2012-01-31 LAB — APTT: aPTT: 30 seconds (ref 24–37)

## 2012-01-31 MED ORDER — PARICALCITOL 5 MCG/ML IV SOLN
2.0000 ug | INTRAVENOUS | Status: DC
  Administered 2012-02-01: 2 ug via INTRAVENOUS
  Filled 2012-01-31: qty 0.4

## 2012-01-31 MED ORDER — INSULIN ASPART 100 UNIT/ML ~~LOC~~ SOLN: 0.0000 [IU] | Freq: Every day | SUBCUTANEOUS | Status: DC

## 2012-01-31 MED ORDER — ENOXAPARIN SODIUM 30 MG/0.3ML ~~LOC~~ SOLN
30.0000 mg | SUBCUTANEOUS | Status: DC
  Administered 2012-01-31: 30 mg via SUBCUTANEOUS
  Filled 2012-01-31 (×2): qty 0.3

## 2012-01-31 MED ORDER — HYDRALAZINE HCL 20 MG/ML IJ SOLN
20.0000 mg | Freq: Four times a day (QID) | INTRAMUSCULAR | Status: DC | PRN
  Filled 2012-01-31 (×3): qty 1

## 2012-01-31 MED ORDER — SODIUM CHLORIDE 0.9 % IV SOLN
125.0000 mg | Freq: Once | INTRAVENOUS | Status: AC
  Administered 2012-02-01: 125 mg via INTRAVENOUS
  Filled 2012-01-31: qty 10

## 2012-01-31 MED ORDER — HYDROMORPHONE HCL PF 1 MG/ML IJ SOLN
1.0000 mg | Freq: Once | INTRAMUSCULAR | Status: AC
  Administered 2012-01-31: 1 mg via INTRAVENOUS
  Filled 2012-01-31: qty 1

## 2012-01-31 MED ORDER — SERTRALINE HCL 50 MG PO TABS
50.0000 mg | ORAL_TABLET | Freq: Every day | ORAL | Status: DC
  Administered 2012-01-31: 50 mg via ORAL
  Filled 2012-01-31 (×2): qty 1

## 2012-01-31 MED ORDER — ONDANSETRON HCL 4 MG PO TABS
4.0000 mg | ORAL_TABLET | Freq: Three times a day (TID) | ORAL | Status: DC | PRN
  Administered 2012-02-01: 4 mg via ORAL
  Filled 2012-01-31: qty 1

## 2012-01-31 MED ORDER — INSULIN GLARGINE 100 UNIT/ML ~~LOC~~ SOLN
20.0000 [IU] | Freq: Every day | SUBCUTANEOUS | Status: DC
  Administered 2012-01-31: 20 [IU] via SUBCUTANEOUS
  Filled 2012-01-31: qty 3

## 2012-01-31 MED ORDER — INSULIN ASPART 100 UNIT/ML ~~LOC~~ SOLN
0.0000 [IU] | Freq: Three times a day (TID) | SUBCUTANEOUS | Status: DC
  Administered 2012-01-31: 5 [IU] via SUBCUTANEOUS
  Filled 2012-01-31: qty 3

## 2012-01-31 MED ORDER — LOSARTAN POTASSIUM 50 MG PO TABS
100.0000 mg | ORAL_TABLET | Freq: Every day | ORAL | Status: DC
  Administered 2012-01-31: 100 mg via ORAL
  Filled 2012-01-31 (×2): qty 2

## 2012-01-31 MED ORDER — INSULIN ASPART 100 UNIT/ML ~~LOC~~ SOLN
3.0000 [IU] | Freq: Three times a day (TID) | SUBCUTANEOUS | Status: DC
  Administered 2012-01-31: 3 [IU] via SUBCUTANEOUS

## 2012-01-31 MED ORDER — AMLODIPINE BESYLATE 5 MG PO TABS
5.0000 mg | ORAL_TABLET | Freq: Every day | ORAL | Status: DC
  Administered 2012-01-31: 5 mg via ORAL
  Filled 2012-01-31 (×2): qty 1

## 2012-01-31 MED ORDER — LABETALOL HCL 200 MG PO TABS
200.0000 mg | ORAL_TABLET | Freq: Two times a day (BID) | ORAL | Status: DC
  Administered 2012-01-31: 200 mg via ORAL
  Filled 2012-01-31 (×3): qty 1

## 2012-01-31 MED ORDER — ONDANSETRON HCL 4 MG/2ML IJ SOLN
4.0000 mg | Freq: Once | INTRAMUSCULAR | Status: AC
  Administered 2012-01-31: 4 mg via INTRAVENOUS
  Filled 2012-01-31: qty 2

## 2012-01-31 MED ORDER — ACETAMINOPHEN 325 MG PO TABS: 650.0000 mg | ORAL_TABLET | Freq: Four times a day (QID) | ORAL | Status: DC | PRN

## 2012-01-31 MED ORDER — SODIUM CHLORIDE 0.9 % IV SOLN: 125.0000 mg | INTRAVENOUS | Status: DC

## 2012-01-31 MED ORDER — METOCLOPRAMIDE HCL 10 MG PO TABS
10.0000 mg | ORAL_TABLET | Freq: Four times a day (QID) | ORAL | Status: DC
  Administered 2012-01-31 (×2): 10 mg via ORAL
  Filled 2012-01-31 (×6): qty 1

## 2012-01-31 MED ORDER — SODIUM CHLORIDE 0.9 % IJ SOLN: 3.0000 mL | INTRAMUSCULAR | Status: DC | PRN

## 2012-01-31 MED ORDER — SIMVASTATIN 10 MG PO TABS
10.0000 mg | ORAL_TABLET | Freq: Every day | ORAL | Status: DC
  Administered 2012-01-31: 10 mg via ORAL
  Filled 2012-01-31 (×2): qty 1

## 2012-01-31 MED ORDER — PROMETHAZINE HCL 25 MG PO TABS: 12.5000 mg | ORAL_TABLET | Freq: Four times a day (QID) | ORAL | Status: DC | PRN

## 2012-01-31 MED ORDER — PROMETHAZINE HCL 25 MG/ML IJ SOLN
12.5000 mg | Freq: Four times a day (QID) | INTRAMUSCULAR | Status: DC | PRN
  Administered 2012-02-01: 12.5 mg via INTRAVENOUS

## 2012-01-31 MED ORDER — PANTOPRAZOLE SODIUM 40 MG PO TBEC
40.0000 mg | DELAYED_RELEASE_TABLET | Freq: Two times a day (BID) | ORAL | Status: DC
  Administered 2012-01-31: 40 mg via ORAL
  Filled 2012-01-31: qty 1

## 2012-01-31 MED ORDER — SODIUM CHLORIDE 0.9 % IV SOLN: 250.0000 mL | INTRAVENOUS | Status: DC | PRN

## 2012-01-31 MED ORDER — LOSARTAN POTASSIUM 50 MG PO TABS
100.0000 mg | ORAL_TABLET | Freq: Every day | ORAL | Status: DC
  Filled 2012-01-31 (×2): qty 2

## 2012-01-31 MED ORDER — LOSARTAN POTASSIUM 50 MG PO TABS: 100.0000 mg | ORAL_TABLET | Freq: Every day | ORAL | Status: DC

## 2012-01-31 MED ORDER — ACETAMINOPHEN 650 MG RE SUPP: 650.0000 mg | Freq: Four times a day (QID) | RECTAL | Status: DC | PRN

## 2012-01-31 MED ORDER — SODIUM CHLORIDE 0.9 % IJ SOLN
3.0000 mL | Freq: Two times a day (BID) | INTRAMUSCULAR | Status: DC
  Administered 2012-01-31: 3 mL via INTRAVENOUS

## 2012-01-31 NOTE — H&P (Signed)
[name] is an 36 y.o. male.   Chief Complaint: Nausea and vomitting HPI: 36 yo AAM with history of Type 1 DM and ESRD s/p bilateral BKAs who presents directly from dialysis with nausea and vomiting. Reports feeling dizzy described as "room spinning" towards the end of dialysis on Saturday. Had several episodes of green nonbloody emesis that day. Did not vomit Sunday and was dry heaving throughout day Monday. Today went to dialysis, but did not have dialysis as he felt nauseous/ woozy and had "coffee-ground" emesis. Denies cold symptoms, congestion or nasal drip. Also reports lower left abdominal pain and watery brown diarrhea today, but normal appetite. Does not associate pain with eating. Patient has a history of gastroparesis and takes reglan. Notes left lower back pain as well. Says he produces little urine, but denies dysuria, burning or fever. Denies drug use except marajuana occasionally.   Past Medical History  Diagnosis Date  . Renal insufficiency   . Diabetes mellitus   . Chronic kidney disease, stage 4, severely decreased GFR   . Hypertension   . GERD (gastroesophageal reflux disease)   . Gastropathy   . Dyslipidemia   . Chronic pain   . Depression   . Edema   . Headache   . Hyperlipidemia   . 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 Cataract surgery   . Bilateral bka   . Below knee leg amputation     Family History  Problem Relation Age of Onset  . Diabetes Other   . Hypertension Other   . Heart disease Other    Social History:  reports that he has never smoked. He does not have any smokeless tobacco history on file. He reports that he uses illicit drugs (Marijuana) about 7 times per week. He reports that he does not drink alcohol.  Allergies: No Known Allergies  Medications Prior to Admission  Medication Dose Route Frequency Provider Last Rate Last Dose  . HYDROmorphone (DILAUDID) injection 1 mg  1 mg  Intravenous Once Faustino Congress, PA   1 mg at 01/31/12 [id number]  . HYDROmorphone (DILAUDID) injection 1 mg  1 mg Intravenous Once Dot Lanes, MD   1 mg at 01/31/12 0916  . ondansetron (ZOFRAN) injection 4 mg  4 mg Intravenous Once Faustino Congress, PA   4 mg at 01/31/12 0726  . ondansetron (ZOFRAN) injection 4 mg  4 mg Intravenous Once Dot Lanes, MD   4 mg at 01/31/12 [id number]   Medications Prior to Admission  Medication Sig Dispense Refill  . amLODipine (NORVASC) 5 MG tablet Take 5 mg by mouth daily.       [name] aspirin 81 MG EC tablet Take 81 mg by mouth daily.        [name] esomeprazole (NEXIUM) 40 MG capsule Take 40 mg by mouth at bedtime.       [name] glucagon 1 MG injection Follow package directions for low blood sugar.  1 each  1  . insulin aspart (NOVOLOG FLEXPEN) 100 UNIT/ML injection Inject 2-10 Units into the skin 3 (three) times daily. Sliding scale      . insulin glargine (LANTUS SOLOSTAR) 100 UNIT/ML injection Inject 20 Units into the skin at bedtime.       [name] labetalol (NORMODYNE) 200 MG tablet Take 200 mg by mouth 2 (two) times daily.       . metoCLOPramide (REGLAN) 10 MG tablet Take 10 mg by  mouth 4 (four) times daily.        . sertraline (ZOLOFT) 50 MG tablet Take 1 tablet (50 mg total) by mouth daily.  30 tablet  6  . simvastatin (ZOCOR) 10 MG tablet Take 10 mg by mouth at bedtime.          Results for orders placed during the hospital encounter of 01/31/12 (from the past 48 hour(s))  CBC     Status: Abnormal   Collection Time   01/31/12  7:25 AM      Component Value Range Comment   WBC 8.6  4.0 - 10.5 (K/uL)    RBC 4.50  4.22 - 5.81 (MIL/uL)    Hemoglobin 12.6 (*) 13.0 - 17.0 (g/dL)    HCT 39.8  39.0 - 52.0 (%)    MCV 88.4  78.0 - 100.0 (fL)    MCH 28.0  26.0 - 34.0 (pg)    MCHC 31.7  30.0 - 36.0 (g/dL)    RDW 18.3 (*) 11.5 - 15.5 (%)    Platelets 172  150 - 400 (K/uL)   DIFFERENTIAL     Status: Normal   Collection Time   01/31/12  7:25 AM      Component Value Range Comment    Neutrophils Relative 75  43 - 77 (%)    Neutro Abs 6.5  1.7 - 7.7 (K/uL)    Lymphocytes Relative 14  12 - 46 (%)    Lymphs Abs 1.2  0.7 - 4.0 (K/uL)    Monocytes Relative 6  3 - 12 (%)    Monocytes Absolute 0.6  0.1 - 1.0 (K/uL)    Eosinophils Relative 4  0 - 5 (%)    Eosinophils Absolute 0.3  0.0 - 0.7 (K/uL)    Basophils Relative 1  0 - 1 (%)    Basophils Absolute 0.1  0.0 - 0.1 (K/uL)   COMPREHENSIVE METABOLIC PANEL     Status: Abnormal   Collection Time   01/31/12  7:25 AM      Component Value Range Comment   Sodium 133 (*) 135 - 145 (mEq/L)    Potassium 4.8  3.5 - 5.1 (mEq/L)    Chloride 88 (*) 96 - 112 (mEq/L)    CO2 25  19 - 32 (mEq/L)    Glucose, Bld 357 (*) 70 - 99 (mg/dL)    BUN 60 (*) 6 - 23 (mg/dL)    Creatinine, Ser 11.41 (*) 0.50 - 1.35 (mg/dL)    Calcium 9.1  8.4 - 10.5 (mg/dL)    Total Protein 8.0  6.0 - 8.3 (g/dL)    Albumin 3.4 (*) 3.5 - 5.2 (g/dL)    AST 20  0 - 37 (U/L) HEMOLYSIS AT THIS LEVEL MAY AFFECT RESULT   ALT 15  0 - 53 (U/L)    Alkaline Phosphatase 195 (*) 39 - 117 (U/L)    Total Bilirubin 0.3  0.3 - 1.2 (mg/dL)    GFR calc non Af Amer 5 (*) >90 (mL/min)    GFR calc Af Amer 6 (*) >90 (mL/min)   LIPASE, BLOOD     Status: Abnormal   Collection Time   01/31/12  7:25 AM      Component Value Range Comment   Lipase 61 (*) 11 - 59 (U/L)   PROTIME-INR     Status: Normal   Collection Time   01/31/12  7:25 AM      Component Value Range Comment   Prothrombin Time 12.4  11.6 -  15.2 (seconds)    INR 0.91  0.00 - 1.49    APTT     Status: Normal   Collection Time   01/31/12  7:25 AM      Component Value Range Comment   aPTT 30  24 - 37 (seconds)   POCT I-STAT 3, BLOOD GAS (G3P V)     Status: Abnormal   Collection Time   01/31/12  9:22 AM      Component Value Range Comment   pH, Ven 7.318 (*) 7.250 - 7.300     pCO2, Ven 54.7 (*) 45.0 - 50.0 (mmHg)    pO2, Ven 55.0 (*) 30.0 - 45.0 (mmHg)    Bicarbonate 28.1 (*) 20.0 - 24.0 (mEq/L)    TCO2 30  0 - 100 (mmol/L)      O2 Saturation 85.0      Acid-Base Excess 1.0  0.0 - 2.0 (mmol/L)    Sample type VENOUS      Dg Chest 2 View  01/31/2012  **ADDENDUM** CREATED: 01/31/2012 09:41:54  At the time of initial interpretation, the current chest x-ray was was not able to be compared with prior chest x-ray.  Comparison with the chest x-ray of 09/04/2011 was performed, and the lungs are currently clear.  **END ADDENDUM** SIGNED BY: Melina Copa. Alvester Chou, M.D.    01/31/2012  *RADIOLOGY REPORT*  Clinical Data: Chest pain, nausea, dizziness  CHEST - 2 VIEW  Comparison: None.  Findings: The lungs are clear. Very minimal peribronchial thickening is present.  Mediastinal contours appear normal.  The heart is within normal limits in size.  No bony abnormality is seen.  IMPRESSION: No pneumonia.  Minimal peribronchial thickening.  Original Report Authenticated By: Joretta Bachelor, M.D.    Review of Systems  Constitutional: Negative for fever and chills.  HENT: Negative for congestion and sore throat.   Eyes: Negative for blurred vision.  Respiratory: Positive for cough. Negative for sputum production and shortness of breath.   Cardiovascular: Negative for chest pain.  Gastrointestinal: Positive for nausea, vomiting, abdominal pain and diarrhea. Negative for blood in stool and melena.  Genitourinary: Positive for flank pain. Negative for dysuria.  Musculoskeletal: Positive for back pain.  Skin: Negative for rash.  Neurological: Positive for dizziness and headaches.    Blood pressure 159/91, pulse 95, temperature 98.1 F (36.7 C), temperature source Oral, resp. rate 18, SpO2 100.00%. Physical Exam  Constitutional: He is oriented to person, place, and time. He appears well-developed and well-nourished.  HENT:  Head: Normocephalic and atraumatic.  Mouth/Throat: Oropharynx is clear and moist.  Eyes: Conjunctivae and EOM are normal. No scleral icterus.  Neck: Normal range of motion. Neck supple.  Cardiovascular: Normal rate, regular  rhythm and normal heart sounds.  Exam reveals no gallop and no friction rub.   No murmur heard. Respiratory: Effort normal and breath sounds normal. He has no wheezes. He has no rales.  GI: Soft. Bowel sounds are normal. He exhibits no distension and no mass. There is tenderness in the left lower quadrant. There is CVA tenderness. There is no rebound and no guarding.  Musculoskeletal:       Bilateral BKAs.  Neurological: He is alert and oriented to person, place, and time. No cranial nerve deficit.  Skin: No rash noted.     Assessment/Plan 36 yo AAM with history of Type 1 DM and ESRD s/p bilateral BKAs who presents directly from dialysis with nausea and vomiting.  1. Nausea, Vomiting, Abdominal Pain. Differential is broad at  this time to include pyelonephritis given CVA tenderness and oliguria, gastroenteritis, gastroparesis, UGI bleed, pancreatitis or diverticulitis. - Will consult Renal and look forward to their recommendations as well for dialysis needs. Thanks for assistance. - Will check CT abdomen without contrast to evaluate for renal etiology - Check UA and Urine Culture - Check Lipase - Monitor daily CMP and CBC - Hematemesis - likely Mallory-Weiss tear secondary, but ulcer, AVMS, gastritis also possible.  HgB is normal. If drop or continued hematemesis will consider GI consult for endoscopic eval.  2. Diarrhea: New onset nonbloody diarrhea today possibly gastroenteritis. - Will continue to monitor for any hematochezia or melena - Check for C. Diff.  3. Neuro: Dizziness described as room spinning. Onset consistent with timing of dialysis likely related to volume status, but vestibular neuronitis or CVA also possible. - Will get Brain MRI - Check orthostatics - Will give Zofran PRN nausea. - Tylenol PRN pain  4. Type 1 DM: Glucose over 300 on admission - Continue home Lantus 20 units qhs and will place on SSI  5. HTN: - Continue home Amlodopine, Losartan and Labetalol. -  Hydralizine as needed.   6. HLD: - Continue home simvastatin  7. Depression - Continue home Sertraline  8. FEN/GI: - Diabetic diet as tolerated  9. PPx: No Heparin given possible UGI bleed and no SCDs as bilateral BKAs, Protonix  10. Code Status: Full Code  Jonetta Osgood, [facility] Teaching Service 01/31/2012, 2:04 PM  3:53 PM 01/31/2012 Lyndee Hensen MD  R3 addendum  Patient seen and examined with Dr. Otis Dials and Jonetta Osgood MS4. I agree with the above H+P  Briefly:  Patient states that since HD last Saturday he has had N/V retching, coffee ground emesis, left flank pain, vague abdominal pain. No fevers, no chills. Eating well. No food related pain. Also attests to dizziness that started the same day, he says the dizziness improves when he lies flat. He is not orthostatic on exam and is HTN at this time.  Pertinent items are noted in HPI. No fever, chills, night sweats, weight loss. Denies visual changes, melena, stomach pain. Denies substance abuse.  Filed Vitals:   01/31/12 0751 01/31/12 1423 01/31/12 1501 01/31/12 1502  BP: 159/91 196/112 192/108 170/118  Pulse:   87 88  Temp: 98.1 F (36.7 C)     TempSrc: Oral     Resp:  16 10 11   SpO2: 100% 100% 100% 100%  Lungs:  Normal respiratory effort, chest expands symmetrically. Lungs are clear to auscultation, no crackles or wheezes. Abdomen: soft tender LLQ, without masses, organomegaly or hernias noted.  No guarding or rebound. No scars. Positive CVAT left side. Heart - Regular rate and rhythm.  No murmurs, gallops or rubs.    Extremities:  No cyanosis, edema, bilateral BKA, no wounds. Skin:  Intact without suspicious lesions or rashes. Some old scratches on wrists. No track marks. Neuro: no nystagmus on exam, CN 2-12 intact. No pronator drift. Reflexes 2+ x 4. No speech irregularity. No visual field deficits. Hearing intact grossly.  LABS: Wnl for the most part. Lipase: 61 Cr: 11 (ESRD on HD) cxray :  normal U/A: pending K: 4.8  A/p  37 y/o aam with dizziness, N/ hematemesis and dry emesis, with diarrhea, and CVAT left side, LLQ tenderness. Hx of DM insulin dependant, ESRD on HD, BKA bilaterally for vascual/diabetic feet wounds.  1. N/V/Diarrhea/hematemesis Acute, no anemia, one report of hematemesis, likely from dry heaving. No gastric complaint, ulcer hx, alcohol  history. - DDx: viral gastroenteritis, pancreatitis, urolithiasis, pyelonephritis (low), diverticulitis (low), gastroparesis. - will observe o/n, patient appears hydrated and miss HD today, will not give IVF. -Cdiff -antiemetics - U/A, urine cx - CT abdomen without contrast   2. Dizziness Dix-hallpike negative. Orthostatics ordered, not likely. Could be related to his diarrhea/emesis. No anemia. Vasovagal unlikely, since he still has it and its positional. BPPV? Will monitor No arrhythmias, syncope. MRI head today (he had one in September 2012, which was normal,  But this cannot rule out acute infarct)  UDS ordered  3. ESRD HD was supposed to happen today. I notified Dr. Posey Pronto with nephrology He does not need emergent dialysis, electrolytes are WNL, does not appear fluid overloaded.  4. DM Continue home Lantus SSI Renal diet  5. FENGI Renal diet, lovenox Subq proph. Protonix (home med)  6. Dispo Home tomorrow likely, pending workup and clinical improvement.   Mazal Ebey 4:24 PM 01/31/2012

## 2012-01-31 NOTE — ED Notes (Signed)
Pt ao x 4.  Stating abd pain and emesis since dialysis on Sat.   States today vomiting clots and that he also feels like he is unsteady.

## 2012-01-31 NOTE — ED Notes (Signed)
Pt only had lying and sitting. Pt has did not have the bottom prosthetic legs on.3:03 pm JG

## 2012-01-31 NOTE — ED Notes (Signed)
Pt sts his dialysis on Saturday was bad.  He is still feeling bad and reports left sided abdominal pain9/10 and vomiting blood.  Pt sts feels like his balance is off.

## 2012-01-31 NOTE — Consult Note (Signed)
[facility] Renal Consultation Note   HPI: [name] is a 36 y.o. male can to hemodialysis today co n/v since last hd  With some bloody emesis/ weakness and dizziness." Told to come to ER by RN JACK". Noted to be pulled below edw with 7.90 kg uf/ goal was6.9kg. Denies sob or chest pain. No fever, chills or skin ulcers.  Dialysis Orders: Center: [facility]  on tthsat . EDW 65kg but leaving below HD [place] ca 2.5 2.0k  Time 4.0 hrs Heparin 7.5 units. Access gleft u arm avgg BFR 400 DFR 800    Zemplar 2 mcg IV/HD Epogen 0   Units IV/HD  Venofer  100 qwk.  Other 0    Past Medical History  Diagnosis Date  . Renal insufficiency   . Diabetes mellitus   . Chronic kidney disease, stage 4, severely decreased GFR   . Hypertension   . GERD (gastroesophageal reflux disease)   . Gastropathy   . Dyslipidemia   . Chronic pain   . Depression   . Edema   . Headache   . Hyperlipidemia   . 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 Cataract surgery   . Bilateral bka   . Below knee leg amputation       Family History  Problem Relation Age of Onset  . Diabetes Other   . Hypertension Other   . Heart disease Other    Lives with Nephew, separated from wife , no children   reports that he has never smoked. He does not have any smokeless tobacco history on file. He reports that he uses illicit drugs (Marijuana) about 7 times per week. He reports that he does not drink alcohol.  No Known Allergies  Prior to Admission medications   Medication Sig Start Date End Date Taking? Authorizing Provider  amLODipine (NORVASC) 5 MG tablet Take 5 mg by mouth daily.    Yes Historical Provider, MD  aspirin 81 MG EC tablet Take 81 mg by mouth daily.     Yes Historical Provider, MD  esomeprazole (NEXIUM) 40 MG capsule Take 40 mg by mouth at bedtime.    Yes Historical Provider, MD  glucagon 1 MG injection Follow package directions for low blood sugar.  04/13/11 04/12/12 Yes Dawn Caviness, MD  insulin aspart (NOVOLOG FLEXPEN) 100 UNIT/ML injection Inject 2-10 Units into the skin 3 (three) times daily. Sliding scale   Yes Historical Provider, MD  insulin glargine (LANTUS SOLOSTAR) 100 UNIT/ML injection Inject 20 Units into the skin at bedtime.    Yes Historical Provider, MD  labetalol (NORMODYNE) 200 MG tablet Take 200 mg by mouth 2 (two) times daily.    Yes Historical Provider, MD  losartan (COZAAR) 100 MG tablet Take 100 mg by mouth at bedtime.   Yes Historical Provider, MD  metoCLOPramide (REGLAN) 10 MG tablet Take 10 mg by mouth 4 (four) times daily.     Yes Historical Provider, MD  ondansetron (ZOFRAN) 4 MG tablet Take 4 mg by mouth every 8 (eight) hours as needed. For nausea   Yes Historical Provider, MD  oxyCODONE (OXY IR/ROXICODONE) 5 MG immediate release tablet Take 5-10 mg by mouth every 6 (six) hours as needed. For pain.   Yes Historical Provider, MD  pantoprazole (PROTONIX) 40 MG tablet Take 40 mg by mouth 2 (two) times daily.   Yes Historical Provider, MD  promethazine (PHENERGAN) 12.5 MG tablet Take 12.5 mg by mouth  every 6 (six) hours as needed. For nausea   Yes Historical Provider, MD  sertraline (ZOLOFT) 50 MG tablet Take 1 tablet (50 mg total) by mouth daily. 04/13/11 04/12/12 Yes Dorthey Sawyer, MD  simvastatin (ZOCOR) 10 MG tablet Take 10 mg by mouth at bedtime.     Yes Historical Provider, MD    [id number]  Results for orders placed during the hospital encounter of 01/31/12 (from the past 48 hour(s))  CBC     Status: Abnormal   Collection Time   01/31/12  7:25 AM      Component Value Range Comment   WBC 8.6  4.0 - 10.5 (K/uL)    RBC 4.50  4.22 - 5.81 (MIL/uL)    Hemoglobin 12.6 (*) 13.0 - 17.0 (g/dL)    HCT 39.8  39.0 - 52.0 (%)    MCV 88.4  78.0 - 100.0 (fL)    MCH 28.0  26.0 - 34.0 (pg)    MCHC 31.7  30.0 - 36.0 (g/dL)    RDW 18.3 (*) 11.5 - 15.5 (%)    Platelets 172  150 - 400 (K/uL)   DIFFERENTIAL     Status:  Normal   Collection Time   01/31/12  7:25 AM      Component Value Range Comment   Neutrophils Relative 75  43 - 77 (%)    Neutro Abs 6.5  1.7 - 7.7 (K/uL)    Lymphocytes Relative 14  12 - 46 (%)    Lymphs Abs 1.2  0.7 - 4.0 (K/uL)    Monocytes Relative 6  3 - 12 (%)    Monocytes Absolute 0.6  0.1 - 1.0 (K/uL)    Eosinophils Relative 4  0 - 5 (%)    Eosinophils Absolute 0.3  0.0 - 0.7 (K/uL)    Basophils Relative 1  0 - 1 (%)    Basophils Absolute 0.1  0.0 - 0.1 (K/uL)   COMPREHENSIVE METABOLIC PANEL     Status: Abnormal   Collection Time   01/31/12  7:25 AM      Component Value Range Comment   Sodium 133 (*) 135 - 145 (mEq/L)    Potassium 4.8  3.5 - 5.1 (mEq/L)    Chloride 88 (*) 96 - 112 (mEq/L)    CO2 25  19 - 32 (mEq/L)    Glucose, Bld 357 (*) 70 - 99 (mg/dL)    BUN 60 (*) 6 - 23 (mg/dL)    Creatinine, Ser 11.41 (*) 0.50 - 1.35 (mg/dL)    Calcium 9.1  8.4 - 10.5 (mg/dL)    Total Protein 8.0  6.0 - 8.3 (g/dL)    Albumin 3.4 (*) 3.5 - 5.2 (g/dL)    AST 20  0 - 37 (U/L) HEMOLYSIS AT THIS LEVEL MAY AFFECT RESULT   ALT 15  0 - 53 (U/L)    Alkaline Phosphatase 195 (*) 39 - 117 (U/L)    Total Bilirubin 0.3  0.3 - 1.2 (mg/dL)    GFR calc non Af Amer 5 (*) >90 (mL/min)    GFR calc Af Amer 6 (*) >90 (mL/min)   LIPASE, BLOOD     Status: Abnormal   Collection Time   01/31/12  7:25 AM      Component Value Range Comment   Lipase 61 (*) 11 - 59 (U/L)   PROTIME-INR     Status: Normal   Collection Time   01/31/12  7:25 AM      Component Value Range Comment  Prothrombin Time 12.4  11.6 - 15.2 (seconds)    INR 0.91  0.00 - 1.49    APTT     Status: Normal   Collection Time   01/31/12  7:25 AM      Component Value Range Comment   aPTT 30  24 - 37 (seconds)   POCT I-STAT 3, BLOOD GAS (G3P V)     Status: Abnormal   Collection Time   01/31/12  9:22 AM      Component Value Range Comment   pH, Ven 7.318 (*) 7.250 - 7.300     pCO2, Ven 54.7 (*) 45.0 - 50.0 (mmHg)    pO2, Ven 55.0 (*) 30.0 - 45.0  (mmHg)    Bicarbonate 28.1 (*) 20.0 - 24.0 (mEq/L)    TCO2 30  0 - 100 (mmol/L)    O2 Saturation 85.0      Acid-Base Excess 1.0  0.0 - 2.0 (mmol/L)    Sample type VENOUS      EKG: normal EKG, normal sinus rhythm, unchanged from previous tracings, RBBB.  ROS: see hpi for positives.  Physical Exam: Filed Vitals:   01/31/12 1502  BP: 170/118  Pulse: 88  Temp:   Resp: 46     General:Thin young black male in er nad, pleasant and appropriate HEENT:[place], MMM Eyes:none icteric, eomi Neck:supple, no jvd Heart:RRR ?s4, soft 1/6 sem lsb Lungs:CTA Abdomen: bs+ and Nl, soft, tender epigastric area and left upper quad Extremities:bilat. BKAS  Stumps without edema Skin:no ulcers seen/ no rash or ulcers noted Neuro:ox3, moving all ext. normally Dialysis Access:pos. Bruit  Left upper arm avgg  Assessment/Plan: 1.Nausea/ Vomiting/ Abdominal Pain= wu per admit team/ Gastoparesis 2.Dizziness= ? orthostatic post vol removal on hd with large vol. Gain noted sat in the setting of IDDM TYPE 1 pt. 2. ESRD -  Normal tthsat at [employer] hold hd today with volume okay on chest xray and exam and potassium okay/ hd in am attempt 1 or 2 liters as tol; 3 Hypertension/volume  -meds as noted per admi team 4. Anemia  -  No epo  Hgb> 12, weekly iron on hd, fu hgbs 5. Metabolic bone disease -  zemplar on hd and binders, fu ca and phos 6. IDDM= per admit    Ernest Haber, PA-C [place] [phone] 01/31/2012, 3:09 PM

## 2012-01-31 NOTE — ED Notes (Signed)
Pt refused to be cath ed and pt refused to give urine till he gets something to drink. But pt has iv fluids running and he told me he wasn't supposed to have fluids running because he's a dialis pt.I also informed 8:53 am JG.

## 2012-01-31 NOTE — ED Notes (Signed)
Admission MD at the patient bedisde

## 2012-01-31 NOTE — Consult Note (Signed)
I have seen and examined this patient and agree with the assessment/plan as outlined above by [facility] PA. The patient presents with coffee ground emesis after a weekend of feeling poorly (non postural light-headedness and without post-dialytic hypotension). Plan for HD today per schedule-no heparin and minimal ultrafiltration. Hemoglobin/BP stability do not point to a clinically significant bleed. ?Gastritis vs esophagitis clinically Hasel Janish K.,MD 01/31/2012 3:56 PM

## 2012-01-31 NOTE — ED Provider Notes (Signed)
History     CSN: [id number]  Arrival date & time 01/31/12  0627   First MD Initiated Contact with Patient 01/31/12 [id number]      Chief Complaint  Patient presents with  . Hematemesis  . Abdominal Pain    (Consider location/radiation/quality/duration/timing/severity/associated sxs/prior treatment) HPI Comments: Patient with history of ESRD on dialysis Tuesday/Thursday/Saturday, diabetes, hypertension -- presents with 3 days of nausea and vomiting, left-sided abdominal pain that started while he was at dialysis on Saturday. Patient states that vomiting is intermittent but was worse this morning. Patient did streaks of blood in his vomit. He tried to go to dialysis this morning but they sent him to the emergency department for evaluation. Patient denies fevers or chills. He denies chest pain. He has had coughing at times. Patient has had diarrhea for the past 3 days. No blood reported in stools. Patient does produce a small amount of urine is not noted any problems with urination.   Patient is a 36 y.o. male presenting with abdominal pain. The history is provided by the patient and medical records.  Abdominal Pain The primary symptoms of the illness include abdominal pain, fatigue, nausea, vomiting, diarrhea and hematemesis. The primary symptoms of the illness do not include fever, shortness of breath, hematochezia or dysuria. The current episode started more than 2 days ago.  The patient has had a change in bowel habit. Symptoms associated with the illness do not include chills. Significant associated medical issues include diabetes.    Past Medical History  Diagnosis Date  . Renal insufficiency   . Diabetes mellitus   . Chronic kidney disease, stage 4, severely decreased GFR   . Hypertension   . GERD (gastroesophageal reflux disease)   . Gastropathy   . Dyslipidemia   . Chronic pain   . Depression   . Edema   . Headache   . Hyperlipidemia   . AMPUTATION, BELOW KNEE, HX OF 04/08/2008     Past Surgical History  Procedure Date  . Cataract surgery   . Bilateral bka   . Below knee leg amputation     Family History  Problem Relation Age of Onset  . Diabetes Other   . Hypertension Other   . Heart disease Other     History  Substance Use Topics  . Smoking status: Never Smoker   . Smokeless tobacco: Not on file  . Alcohol Use: No      Review of Systems  Constitutional: Positive for fatigue. Negative for fever and chills.  HENT: Negative for sore throat and rhinorrhea.   Eyes: Negative for redness.  Respiratory: Positive for cough. Negative for shortness of breath.   Cardiovascular: Negative for chest pain.  Gastrointestinal: Positive for nausea, vomiting, abdominal pain, diarrhea and hematemesis. Negative for blood in stool and hematochezia.  Genitourinary: Negative for dysuria.  Musculoskeletal: Negative for myalgias.  Skin: Negative for rash.  Neurological: Negative for headaches.    Allergies  Review of patient's allergies indicates no known allergies.  Home Medications   Current Outpatient Rx  Name Route Sig Dispense Refill  . AMLODIPINE BESYLATE 5 MG PO TABS Oral Take 5 mg by mouth daily.     . ASPIRIN 81 MG PO TBEC Oral Take 81 mg by mouth daily.      [name] ESOMEPRAZOLE MAGNESIUM 40 MG PO CPDR Oral Take 40 mg by mouth at bedtime.     [name] GLUCAGON (RDNA) 1 MG IJ KIT  Follow package directions for low blood sugar. 1 each  1  . INSULIN ASPART 100 UNIT/ML [place] SOLN Subcutaneous Inject 2-10 Units into the skin 3 (three) times daily. Sliding scale    . INSULIN GLARGINE 100 UNIT/ML [place] SOLN Subcutaneous Inject 20 Units into the skin at bedtime.     [name] LABETALOL HCL 200 MG PO TABS Oral Take 200 mg by mouth 2 (two) times daily.     [name] LOSARTAN POTASSIUM 100 MG PO TABS Oral Take 100 mg by mouth at bedtime.    [name] METOCLOPRAMIDE HCL 10 MG PO TABS Oral Take 10 mg by mouth 4 (four) times daily.      [name] ONDANSETRON HCL 4 MG PO TABS Oral Take 4 mg by mouth every 8 (eight)  hours as needed. For nausea    . OXYCODONE HCL 5 MG PO TABS Oral Take 5-10 mg by mouth every 6 (six) hours as needed. For pain.    [name] PANTOPRAZOLE SODIUM 40 MG PO TBEC Oral Take 40 mg by mouth 2 (two) times daily.    [name] PROMETHAZINE HCL 12.5 MG PO TABS Oral Take 12.5 mg by mouth every 6 (six) hours as needed. For nausea    . SERTRALINE HCL 50 MG PO TABS Oral Take 1 tablet (50 mg total) by mouth daily. 30 tablet 6  . SIMVASTATIN 10 MG PO TABS Oral Take 10 mg by mouth at bedtime.        BP 174/109  Pulse 95  Temp(Src) 98.2 F (36.8 C) (Oral)  Resp 18  SpO2 100%  Physical Exam  Nursing note and vitals reviewed. Constitutional: He is oriented to person, place, and time. He appears well-developed and well-nourished.  HENT:  Head: Normocephalic and atraumatic.  Eyes: Conjunctivae are normal. Right eye exhibits no discharge. Left eye exhibits no discharge.  Neck: Normal range of motion. Neck supple.  Cardiovascular: Normal rate, regular rhythm, normal heart sounds and intact distal pulses.   Pulmonary/Chest: Effort normal and breath sounds normal. He has no wheezes.  Abdominal: Soft. Bowel sounds are normal. There is tenderness in the epigastric area and left upper quadrant. There is no rigidity, no rebound, no guarding, no CVA tenderness and negative Murphy's sign.    Musculoskeletal:       Bilateral below-the-knee amputations. AV fistula left upper extremity with palpable thrill.  Neurological: He is alert and oriented to person, place, and time.  Skin: Skin is warm and dry.  Psychiatric: He has a normal mood and affect.    ED Course  Procedures (including critical care time)  Labs Reviewed  CBC - Abnormal; Notable for the following:    Hemoglobin 12.6 (*)    RDW 18.3 (*)    All other components within normal limits  COMPREHENSIVE METABOLIC PANEL - Abnormal; Notable for the following:    Sodium 133 (*)    Chloride 88 (*)    Glucose, Bld 357 (*)    BUN 60 (*)    Creatinine, Ser  11.41 (*)    Albumin 3.4 (*)    Alkaline Phosphatase 195 (*)    GFR calc non Af Amer 5 (*)    GFR calc Af Amer 6 (*)    All other components within normal limits  LIPASE, BLOOD - Abnormal; Notable for the following:    Lipase 61 (*)    All other components within normal limits  POCT I-STAT 3, BLOOD GAS (G3P V) - Abnormal; Notable for the following:    pH, Ven 7.318 (*)    pCO2, Ven 54.7 (*)  pO2, Ven 55.0 (*)    Bicarbonate 28.1 (*)    All other components within normal limits  DIFFERENTIAL  PROTIME-INR  APTT  URINALYSIS, ROUTINE W REFLEX MICROSCOPIC  URINE CULTURE  CLOSTRIDIUM DIFFICILE BY PCR  URINE RAPID DRUG SCREEN (HOSP PERFORMED)   Dg Chest 2 View  01/31/2012  **ADDENDUM** CREATED: 01/31/2012 09:41:54  At the time of initial interpretation, the current chest x-ray was was not able to be compared with prior chest x-ray.  Comparison with the chest x-ray of 09/04/2011 was performed, and the lungs are currently clear.  **END ADDENDUM** SIGNED BY: Melina Copa. Alvester Chou, M.D.    01/31/2012  *RADIOLOGY REPORT*  Clinical Data: Chest pain, nausea, dizziness  CHEST - 2 VIEW  Comparison: None.  Findings: The lungs are clear. Very minimal peribronchial thickening is present.  Mediastinal contours appear normal.  The heart is within normal limits in size.  No bony abnormality is seen.  IMPRESSION: No pneumonia.  Minimal peribronchial thickening.  Original Report Authenticated By: Joretta Bachelor, M.D.     1. Nausea vomiting and diarrhea   2. ESRD (end stage renal disease)     7:03 AM Patient seen and examined. Work-up initiated. Medications ordered.   Vital signs reviewed and are as follows: Filed Vitals:   01/31/12 0630  BP: 174/109  Pulse: 95  Temp: 98.2 F (36.8 C)  Resp: 18   Patient continued to have nausea and pain. [facility] was asked to consult. They have seen patient and will admit for obs.    MDM  N/V/D, no DKA, admit for obs        Faustino Congress, [place] 01/31/12 1525

## 2012-01-31 NOTE — H&P (Signed)
Seen and examined.  Discussed with MS4 Lyndel Safe and Dr. Vallarie Mare.  Agree with their H, PE, Assess, plans and orders.  Briefly, Patient well known to me with longstanding Diabetes with complications including bilateral BKA for PVD and ESRD now presents with significant dizziness and vomiting.  The etiology is unclear.  Diff Dx includes; 1. GI, most likely given accompanying Lt sided discomfort and loose stools.  Does not sound like typical viral GE.  No clear cause by H&PE.  I am just trying to get the pathology in the right organ system. 2. Neurovestibular.  He does give a hx of true vertigo and a very abrupt onset.  CVA, acute labyrinthitis are not great fits for his symptom complex but should be considered. 3. Metabolic/FEN, unlikely even though onset at dialysis.  His volume status and CMP look normal for him.   4. GU, unlikely given normal UA  We shall see what the work up yields.  Dispo as soon as his symptoms are controled.

## 2012-01-31 NOTE — ED Notes (Signed)
[id number] Ready

## 2012-02-01 ENCOUNTER — Observation Stay (HOSPITAL_COMMUNITY): Payer: Medicare Other

## 2012-02-01 DIAGNOSIS — R112 Nausea with vomiting, unspecified: Secondary | ICD-10-CM

## 2012-02-01 DIAGNOSIS — R197 Diarrhea, unspecified: Secondary | ICD-10-CM

## 2012-02-01 DIAGNOSIS — N186 End stage renal disease: Secondary | ICD-10-CM

## 2012-02-01 DIAGNOSIS — R42 Dizziness and giddiness: Secondary | ICD-10-CM

## 2012-02-01 LAB — COMPREHENSIVE METABOLIC PANEL
ALT: 10 U/L (ref 0–53)
Alkaline Phosphatase: 128 U/L — ABNORMAL HIGH (ref 39–117)
CO2: 22 mEq/L (ref 19–32)
Calcium: 9 mg/dL (ref 8.4–10.5)
GFR calc Af Amer: 5 mL/min — ABNORMAL LOW (ref >90)
GFR calc non Af Amer: 4 mL/min — ABNORMAL LOW (ref >90)
Glucose, Bld: 280 mg/dL — ABNORMAL HIGH (ref 70–99)
Sodium: 131 mEq/L — ABNORMAL LOW (ref 135–145)

## 2012-02-01 LAB — GLUCOSE, CAPILLARY
Glucose-Capillary: 254 mg/dL — ABNORMAL HIGH (ref 70–99)
Glucose-Capillary: 284 mg/dL — ABNORMAL HIGH (ref 70–99)

## 2012-02-01 LAB — CBC
Hemoglobin: 11.4 g/dL — ABNORMAL LOW (ref 13.0–17.0)
MCH: 28.1 pg (ref 26.0–34.0)
MCHC: 32.8 g/dL (ref 30.0–36.0)
MCHC: 32.9 g/dL (ref 30.0–36.0)
MCV: 85.2 fL (ref 78.0–100.0)
Platelets: 194 10*3/uL (ref 150–400)
Platelets: 198 10*3/uL (ref 150–400)
RBC: 3.85 MIL/uL — ABNORMAL LOW (ref 4.22–5.81)
RDW: 17.4 % — ABNORMAL HIGH (ref 11.5–15.5)

## 2012-02-01 LAB — RENAL FUNCTION PANEL
CO2: 21 mEq/L (ref 19–32)
Calcium: 9.1 mg/dL (ref 8.4–10.5)
Creatinine, Ser: 13.55 mg/dL — ABNORMAL HIGH (ref 0.50–1.35)
GFR calc non Af Amer: 4 mL/min — ABNORMAL LOW (ref >90)
Glucose, Bld: 278 mg/dL — ABNORMAL HIGH (ref 70–99)
Phosphorus: 9.9 mg/dL — ABNORMAL HIGH (ref 2.3–4.6)
Sodium: 130 mEq/L — ABNORMAL LOW (ref 135–145)

## 2012-02-01 LAB — URINE CULTURE

## 2012-02-01 MED ORDER — PARICALCITOL 5 MCG/ML IV SOLN
INTRAVENOUS | Status: AC
  Administered 2012-02-01: 2 ug via INTRAVENOUS
  Filled 2012-02-01: qty 1

## 2012-02-01 MED ORDER — IOHEXOL 300 MG/ML  SOLN
40.0000 mL | Freq: Once | INTRAMUSCULAR | Status: AC | PRN
  Administered 2012-02-01: 40 mL via ORAL

## 2012-02-01 MED ORDER — PROMETHAZINE HCL 12.5 MG RE SUPP: 12.5000 mg | Freq: Four times a day (QID) | RECTAL | Status: DC | PRN

## 2012-02-01 MED ORDER — CALCIUM ACETATE 667 MG PO CAPS
1334.0000 mg | ORAL_CAPSULE | Freq: Three times a day (TID) | ORAL | Status: DC
  Filled 2012-02-01 (×3): qty 2

## 2012-02-01 MED ORDER — ONDANSETRON 4 MG PO TBDP: 4.0000 mg | ORAL_TABLET | Freq: Three times a day (TID) | ORAL | Status: AC | PRN

## 2012-02-01 MED ORDER — PROMETHAZINE HCL 12.5 MG PO TABS: 25.0000 mg | ORAL_TABLET | Freq: Three times a day (TID) | ORAL | Status: DC | PRN

## 2012-02-01 MED ORDER — ACETAMINOPHEN 325 MG PO TABS: 650.0000 mg | ORAL_TABLET | Freq: Four times a day (QID) | ORAL | Status: DC | PRN

## 2012-02-01 MED ORDER — AMLODIPINE BESYLATE 5 MG PO TABS: 5.0000 mg | ORAL_TABLET | Freq: Every day | ORAL | Status: DC

## 2012-02-01 MED ORDER — PROMETHAZINE HCL 25 MG/ML IJ SOLN
INTRAMUSCULAR | Status: AC
  Administered 2012-02-01: 12.5 mg via INTRAVENOUS
  Filled 2012-02-01: qty 1

## 2012-02-01 MED ORDER — RENA-VITE PO TABS
1.0000 | ORAL_TABLET | Freq: Every day | ORAL | Status: DC
  Filled 2012-02-01: qty 1

## 2012-02-01 NOTE — Procedures (Signed)
I have personally attended this patient's dialysis procedure (today is amkeup for yesterday's missed treatment) He will have HD again tomorrow.  Access is AVF.  135/85.  Some mild nausea with Rx. No vomiting.  No BP drops.  Rec'd dose of phenergan with relief. [name] B

## 2012-02-01 NOTE — ED Provider Notes (Signed)
Medical screening examination/treatment/procedure(s) were performed by non-physician practitioner and as supervising physician I was immediately available for consultation/collaboration.    Dot Lanes, MD 02/01/12 2044

## 2012-02-01 NOTE — Progress Notes (Signed)
Subjective: Says he is better today and only complains of mild nausea. Dizziness, abdominal pain and back pain all improved. Has not vomited since admission. Two loose brown stools yesterday. Urinated yesterday with no pain, burning or discharge.  Objective: Vital signs in last 24 hours: Temp:  [98.1 F (36.7 C)] 98.1 F (36.7 C) (02/06 0533) Pulse Rate:  [81-88] 81  (02/06 0533) Resp:  [10-19] 18  (02/06 0533) BP: (155-196)/(89-118) 155/89 mmHg (02/06 0533) SpO2:  [97 %-100 %] 97 % (02/06 0533) Weight change:  Last BM Date: 01/31/12  Intake/Output from previous day:   Intake/Output this shift:    Physical Exam  Constitutional: Laying comfortably in bed. Pleasant. No distress. HEENT: Normocephalic and atraumatic. Mouth mucosa moist. EOMI.  Cardiovascular: RRR. No murmurs, rubs or gallops. Respiratory: Normal WOB. CTAB. GI: Soft. ND. No masses. BS +. Mild tenderness LLQ. No guarding or rebound. No CVA tenderness. Musculoskeletal: Bilateral BKAs. Neurological: AOx3. No cranial nerve deficit. No nystagmus with Dix-Hallpike Maneuver. Skin: No rash noted. Multiple tattoos and scars. No wounds.  Lab Results:  Basename 01/31/12 1659 01/31/12 0725  WBC 8.9 8.6  HGB 11.8* 12.6*  HCT 36.2* 39.8  PLT 179 172   BMET  Basename 01/31/12 1659 01/31/12 0725  NA -- 133*  K -- 4.8  CL -- 88*  CO2 -- 25  GLUCOSE -- 357*  BUN -- 60*  CREATININE 12.25* 11.41*  CALCIUM -- 9.1    Studies/Results: Ct Abdomen Pelvis Wo Contrast  02/01/2012  *RADIOLOGY REPORT*  Clinical Data: Abdominal pain  CT ABDOMEN AND PELVIS WITHOUT CONTRAST  IMPRESSION: Circumferential bladder wall thickening is nonspecific, can be seen with cystitis.  Prostate gland appears enlarged and low attenuation.  Correlate clinically if concerned for prostatitis and/or prostatic abscess.  Circumferential thickening of numerous loops of small and large bowel as well as the distal esophagus and stomach is nonspecific. This may  reflect third spacing, inflammatory, vascular, or infectious etiologies.  Original Report Authenticated By: Suanne Marker, M.D.   Dg Chest 2 View  01/31/2012  **ADDENDUM** CREATED: 01/31/2012 09:41:54  At the time of initial interpretation, the current chest x-ray was was not able to be compared with prior chest x-ray.  Comparison with the chest x-ray of 09/04/2011 was performed, and the lungs are currently clear.  **END ADDENDUM** SIGNED BY: Melina Copa. Alvester Chou, M.D.    01/31/2012  *RADIOLOGY REPORT*  Clinical Data: Chest pain, nausea, dizziness  CHEST - 2 VIEW  Comparison: None.  IMPRESSION: No pneumonia.  Minimal peribronchial thickening.  Original Report Authenticated By: Joretta Bachelor, M.D.   Mr Brain Wo Contrast  02/01/2012  *RADIOLOGY REPORT*  Clinical Data: Disequilibrium.  Nausea and vomiting.  Dialysis patient.  Hypertension and diabetes.  MRI HEAD WITHOUT CONTRAST IMPRESSION: No acute or significant finding.  Premature minimal small vessel change within the cerebral hemispheric white matter.  Original Report Authenticated By: Jules Schick, M.D.    Medications: I have reviewed the patient's current medications.  Assessment/Plan: 36 yo AAM with history of Type 1 DM and ESRD s/p bilateral BKAs who presents directly from dialysis with nausea and vomiting.  1. Nausea, Vomiting, Abdominal Pain, Nonbloody Diarrhea. Possibly gastroenteritis vs. Gastroparesis. - Renal consult thanks for assistance. Will do dialysis today. - CT abdomen unremarkable so renal etiology or diverticulitis unlikely. - UA with no nitrites, small leuks and small blood. Will f/u urine cx. No fever or WBC so pyelonephritis unlikely. - Lipase only mildly elevated at 61  - Hematemesis -  likely Mallory-Weiss tear secondary to vomiting, but ulcer, AVMs, gastritis also possible.  HgB is normal and no vomiting since admission. If drop or continued hematemesis will consider GI consult for endoscopic eval. Monitor daily CBC. - C.  Diff negative.  2. Neuro: Dizziness described as room spinning. Onset consistent with timing of dialysis likely related to volume status. Vstibular neuronitis or CVA unlikely. - Brain MRI unremarkable. - Will give Zofran PRN nausea.  - Tylenol PRN pain   3. Type 1 DM: Glucose over 300 on admission  - Continue home Lantus 20 units qhs and will place on SSI   4. HTN:  - Continue home Amlodopine, Losartan and Labetalol.  - Hydralizine as needed.   5. HLD:  - Continue home simvastatin   6. Depression  - Continue home Sertraline   7. FEN/GI:  - Diabetic diet as tolerated   8. PPx: No Heparin given possible UGI bleed and no SCDs as bilateral BKAs, Protonix   9. Code Status: Full Code  10. Dispo: Pending clinically stable post dialysis today. Home late today or tomorrow.   LOS: 1 day   Jonetta Osgood, [facility] Teaching Service 02/01/2012, 10:07 AM  PGY-2 ADDENDUM:  I have seen and examined patient with Jonetta Osgood and I agree with his findings above and assessment and plan with the following additions:   PHYSICAL EXAM: Constitutional: Sitting up in bed, eating breakfast.  No distress. HEENT: Normocephalic and atraumatic. Mouth mucosa moist. EOMI.  Cardiovascular: RRR. No murmurs, rubs or gallops. Respiratory: Normal WOB. CTAB. GI: Soft. ND. No masses. BS +. Mild tenderness LLQ. No guarding or rebound. No CVA tenderness. Musculoskeletal: Bilateral BKAs.  A/P: Nausea, dizzinesst: Will order gastric emptying study (no records in EMR to date).  Patient is already taking Reglan, but nausea is a chronic issue and patient may just need to go home with Reglan, Zofran, and Phenergan PRN.  No further emesis today.  Will continue to monitor this especially after HD today.  If hematemesis persists, will consult GI.  Disposition: pending further work up and clinical improvement  DE LA CRUZ,Jesson Foskey 02/01/12 1641

## 2012-02-01 NOTE — Progress Notes (Signed)
Subjective:  "want some solid food, ready to go home", no further N/V, "walked in room without dizziness" Complains of only minimal "soreness" in his sides Still waiting for dialysis today (last treatment was Saturday) Objective Vital signs in last 24 hours: Filed Vitals:   01/31/12 1502 01/31/12 1700 01/31/12 2212 02/01/12 0533  BP: 170/118 186/116 184/107 155/89  Pulse: 88 88 87 81  Temp:  98.1 F (36.7 C) 98.1 F (36.7 C) 98.1 F (36.7 C)  TempSrc:  Oral Oral Oral  Resp: 11 16 19 18   SpO2: 100% 99% 100% 97%   Weight change:  No intake or output data in the 24 hours ending 02/01/12 1005 Labs: Basic Metabolic Panel:  Lab [id number] 01/31/12 0725  NA -- 133*  K -- 4.8  CL -- 88*  CO2 -- 25  GLUCOSE -- 357*  BUN -- 60*  CREATININE 12.25* 11.41*  CALCIUM -- 9.1  ALB -- --  PHOS -- --   Liver Function Tests:  Lab 01/31/12 0725  AST 20  ALT 15  ALKPHOS 195*  BILITOT 0.3  PROT 8.0  ALBUMIN 3.4*    Lab 01/31/12 0725  LIPASE 61*  AMYLASE --   No results found for this basename: AMMONIA:3 in the last 168 hours CBC:  Lab 01/31/12 1659 01/31/12 0725  WBC 8.9 8.6  NEUTROABS -- 6.5  HGB 11.8* 12.6*  HCT 36.2* 39.8  MCV 87.0 88.4  PLT 179 172   Cardiac Enzymes: No results found for this basename: CKTOTAL:5,CKMB:5,CKMBINDEX:5,TROPONINI:5 in the last 168 hours CBG:  Lab 02/01/12 0835 01/31/12 2139 01/31/12 2010 01/31/12 1628  GLUCAP 254* 188* 206* 229*    Iron Studies: No results found for this basename: IRON,TIBC,TRANSFERRIN,FERRITIN in the last 72 hours Studies/Results: Ct Abdomen Pelvis Wo Contrast  02/01/2012  *RADIOLOGY REPORT*  Clinical Data: Abdominal pain  CT ABDOMEN AND PELVIS WITHOUT CONTRAST  Technique:  Multidetector CT imaging of the abdomen and pelvis was performed following the standard protocol without intravenous contrast.  Comparison: 08/12/2011  Findings: Limited images through the lung bases demonstrate no significant appreciable  abnormality. The heart size is within normal limits. No pleural or pericardial effusion.  Intra-abdominal organ evaluation is limited without intravenous contrast.  Within this limitation, circumferential distal esophageal wall thickening and gastric wall thickening is nonspecific.  In addition, there is circumferential thickening of the rectosigmoid colon wall and jejunal loops.  Appendix within normal limits.  Circumferential thickening of the distal ileal loops.  Unremarkable liver, biliary system, spleen, pancreas, adrenal glands.  Bilateral renal vascular calcifications.  Difficult to exclude small stones in this setting.  However, no hydronephrosis or hydroureter.  Circumferential bladder wall thickening.  Enlarged, low attenuation appearance to the prostate gland.  No definite lymphadenopathy though evaluation is limited without contrast.  Advanced atherosclerotic calcification of the aortic branch vessels.  No acute osseous abnormality.  IMPRESSION: Circumferential bladder wall thickening is nonspecific, can be seen with cystitis.  Prostate gland appears enlarged and low attenuation.  Correlate clinically if concerned for prostatitis and/or prostatic abscess.  Circumferential thickening of numerous loops of small and large bowel as well as the distal esophagus and stomach is nonspecific. This may reflect third spacing, inflammatory, vascular, or infectious etiologies.  Original Report Authenticated By: Suanne Marker, M.D.   Dg Chest 2 View  01/31/2012  **ADDENDUM** CREATED: 01/31/2012 09:41:54  At the time of initial interpretation, the current chest x-ray was was not able to be compared with prior chest x-ray.  Comparison with  the chest x-ray of 09/04/2011 was performed, and the lungs are currently clear.  **END ADDENDUM** SIGNED BY: Melina Copa. Alvester Chou, M.D.    01/31/2012  *RADIOLOGY REPORT*  Clinical Data: Chest pain, nausea, dizziness  CHEST - 2 VIEW  Comparison: None.  Findings: The lungs are clear. Very  minimal peribronchial thickening is present.  Mediastinal contours appear normal.  The heart is within normal limits in size.  No bony abnormality is seen.  IMPRESSION: No pneumonia.  Minimal peribronchial thickening.  Original Report Authenticated By: Joretta Bachelor, M.D.   Mr Brain Wo Contrast  02/01/2012  *RADIOLOGY REPORT*  Clinical Data: Disequilibrium.  Nausea and vomiting.  Dialysis patient.  Hypertension and diabetes.  MRI HEAD WITHOUT CONTRAST  Technique:  Multiplanar, multiecho pulse sequences of the brain and surrounding structures were obtained according to standard protocol without intravenous contrast.  Comparison: 09/06/2011  Findings: Diffusion imaging does not show any acute or subacute infarction.  The brainstem and cerebellum are normal.  The cerebral hemispheres show a few scattered foci of T T A and FLAIR signal within the white matter consistent with minimal small vessel change.  No cortical or large vessel territory infarction.  No mass lesion, hemorrhage, hydrocephalus or extra-axial collection.  No pituitary mass.  No inflammatory sinus disease.  No skull or skull base lesion.  IMPRESSION: No acute or significant finding.  Premature minimal small vessel change within the cerebral hemispheric white matter.  Original Report Authenticated By: Jules Schick, M.D.   Medications:   . amLODipine  5 mg Oral Daily  . enoxaparin  30 mg Subcutaneous Q24H  . ferric gluconate (FERRLECIT/NULECIT) IV  125 mg Intravenous Once in dialysis  . ferric gluconate (FERRLECIT/NULECIT) IV  125 mg Intravenous Q Tue-HD  . insulin aspart  0-15 Units Subcutaneous TID WC  . insulin aspart  0-5 Units Subcutaneous QHS  . insulin aspart  3 Units Subcutaneous TID WC  . insulin glargine  20 Units Subcutaneous QHS  . labetalol  200 mg Oral BID  . losartan  100 mg Oral QHS  . metoCLOPramide  10 mg Oral QID  . pantoprazole  40 mg Oral BID  . paricalcitol  2 mcg Intravenous 3 times weekly  . sertraline  50 mg  Oral Daily  . simvastatin  10 mg Oral QHS  . sodium chloride  3 mL Intravenous Q12H  . DISCONTD: losartan  100 mg Oral QHS  . DISCONTD: losartan  100 mg Oral QHS   I  have reviewed scheduled and prn medications.  Physical Exam: General:alert ,nad Heart:RRR, S4 Lungs:CTA bilat. Abdomen:BS + , soft, min. Tender left upper quad. Extremities: Dialysis Access: bilat bkas, + bruit left upper arm avgg  Problem/Plan: 1.Nausea/ Vomiting/ Abdominal Pain= wu per admit team. Symptoms resolving this am 2.Dizziness= ? orthostatic post vol removal on hd with large vol. Gain noted sat in the setting of IDDM TYPE 1 pt. . RESOLVED this am. Pt walking in rm without symptoms 2. ESRD - Normal tthsat at [place] today with volume okay on chest xray and exam  attempt 1 or 2 liters as tol;  3 Hypertension/volume -meds as noted per admit team  4. Anemia - No epo Hgb> 12, weekly iron on hd, fu hgbs  5. Metabolic bone disease - zemplar on hd and binders, fu ca and phos  6. IDDM= per admit    Ernest Haber, PA-C [place] [phone] 02/01/2012,10:05 AM  LOS: 1 day   Agree with the  history, exam findings and assessment as noted by D. Zeyfang,  [place].  No further vomiting; pt feels he could go home; still in need of HD today; BP up - will likely improve with some volume removal and holding AM meds pre-HD. Staphanie Harbison B

## 2012-02-01 NOTE — Discharge Summary (Signed)
Physician Discharge Summary  Patient ID: [name] MRN: [id number] DOB: [birth year] Age: 36 y.o.  Admit date: 01/31/2012 Discharge date: 02/01/2012  PCP: Dorthey Sawyer, MD, MD  Consultants: Lucrezia Starch, MD, [facility]   Discharge Diagnosis: Active Problems:  DIABETES MELLITUS, TYPE I, UNCONTROLLED, WITH COMPLICATIONS  ESRD (end stage renal disease)  Nausea & vomiting Secondary Problems  PTSD  Metabolic bone disease  HTN  HLD  GERD  Depression  Bilateral [facility] 36 year old African-American male with history of Type 1 DM and ESRD s/p bilateral BKAs who presents directly from dialysis with nausea, vomiting, abdominal pain and nonbloody diarrhea. Also reported dizziness that started towards end of dialysis on 01/27/11  1. GI: Nausea, Vomiting, Abdominal Pain, Nonbloody Diarrhea. Initially differential was broad to include pyelonephritis, kidney stone, gastroenteritis, vestibular neuronitis, CVA, gastroparesis, pancreatitis or diverticulitis. UA was negative except for small leukocytes and small blood and patient did not have fever or WBC. CT abdomen and MRI brain were both done on HD1 and were unremarkable. Patient also improved significantly over first day, had only two loose stools and was C. Diff negative. Of note patient reported "coffee ground" emesis on day of admission, but HgB remained stable and patient had no further emesis during hospital course. Hematemesis was presumed to be due to Mallory-Weiss tears as patient had been vomiting for three days. Gastroenteritis verse gastroparesis was thought more likely to account for most symptoms at this point. Gastroparesis was likely given advanced Diabetes as well as urine drug screen positive for Marijuana (patient also self-reported marijuana use).  Gastric emptying study was cancelled because patient was ready to go home and did not want to stay for study.  Patient informed about this condition  and was encouraged to manage blood sugars, eat small meals and continue home Reglan. Also noted to have history of GERD and was continued on Protonix.  2. Neuro: Initially reported dizziness described as room spinning. The onset was consistent with timing of dialysis and was thought to be likely related to volume status. Brain MRI was unremarkable ruling out vestibular neuronitis or CVA. Patient was given Zofran PRN nausea and Tylenol for pain. He improved and reported minimal nausea on HD1 and did not complain of dizziness. Dix-Hallpike was also done and did not show nystagmus or recurrence of symptoms.  Patient denied any nausea or vomiting at time of discharge.  He was able to keep down fluids and some solid foods.    3. Type 1 DM: Had a glucose of over 300 on admission. Was continued on home Lantus 20 units at bedtime and was placed on SSI. Would recommend close follow-up and management of blood sugar and complications.  4. Renal: ESRD on hemodialysis. Renal was consulted and advised to hold on dialysis on day of admission. Patient did have dialysis on 02/01/12 and would recommend continuing outpatient on MWF schedule.  5. HTN: Home Amlodopine, Losartan and Labetalol were continued and Hydralizine was given as needed.   6. HLD: Home simvastatin was continued.  7. Depression: His home Sertraline was continued.  8. FEN/GI: Was on a diabetic diet as tolerated    9. PPx: Was not placed on Heparin given possible UGI bleed and no SCDs as patient has bilateral BKAs. Was continued on Protonix.   Procedures/Imaging:  Ct Abdomen Pelvis Wo Contrast  02/01/2012  *RADIOLOGY REPORT*  Clinical Data: Abdominal pain  CT ABDOMEN AND PELVIS WITHOUT CONTRAST  IMPRESSION: Circumferential bladder wall thickening  is nonspecific, can be seen with cystitis.  Prostate gland appears enlarged and low attenuation.  Correlate clinically if concerned for prostatitis and/or prostatic abscess.  Circumferential thickening of  numerous loops of small and large bowel as well as the distal esophagus and stomach is nonspecific. This may reflect third spacing, inflammatory, vascular, or infectious etiologies.  Original Report Authenticated By: Suanne Marker, M.D.   Dg Chest 2 View  01/31/2012  **ADDENDUM** CREATED: 01/31/2012 09:41:54  At the time of initial interpretation, the current chest x-ray was was not able to be compared with prior chest x-ray.  Comparison with the chest x-ray of 09/04/2011 was performed, and the lungs are currently clear.  **END ADDENDUM** SIGNED BY: Melina Copa. Alvester Chou, M.D.    01/31/2012  *RADIOLOGY REPORT*  Clinical Data: Chest pain, nausea, dizziness  CHEST - 2 VIEW    IMPRESSION: No pneumonia.  Minimal peribronchial thickening.  Original Report Authenticated By: Joretta Bachelor, M.D.   Mr Brain Wo Contrast  02/01/2012  *RADIOLOGY REPORT*  Clinical Data: Disequilibrium.  Nausea and vomiting.  Dialysis patient.  Hypertension and diabetes.  MRI HEAD WITHOUT CONTRAST IMPRESSION: No acute or significant finding.  Premature minimal small vessel change within the cerebral hemispheric white matter.  Original Report Authenticated By: Jules Schick, M.D.    Labs  CBC  Lab 02/01/12 1334 02/01/12 0948 01/31/12 1659  WBC 6.8 6.1 8.9  HGB 10.8* 11.4* 11.8*  HCT 32.8* 34.8* 36.2*  PLT 198 194 179   BMET  Lab 02/01/12 1334 02/01/12 0948 01/31/12 1659 01/31/12 0725  NA 130* 131* -- 133*  K 5.1 4.9 -- 4.8  CL 86* 87* -- 88*  CO2 21 22 -- 25  BUN 70* 69* -- 60*  CREATININE 13.55* 13.25* 12.25* --  CALCIUM 9.1 9.0 -- 9.1  PROT -- 7.1 -- 8.0  BILITOT -- 0.3 -- 0.3  ALKPHOS -- 128* -- 195*  ALT -- 10 -- 15  AST -- 11 -- 20  GLUCOSE 278* 280* -- 357*   Results for orders placed during the hospital encounter of 01/31/12 (from the past 72 hour(s))  CBC     Status: Abnormal   Collection Time   01/31/12  7:25 AM      Component Value Range Comment   WBC 8.6  4.0 - 10.5 (K/uL)    RBC 4.50  4.22 - 5.81  (MIL/uL)    Hemoglobin 12.6 (*) 13.0 - 17.0 (g/dL)    HCT 39.8  39.0 - 52.0 (%)    MCV 88.4  78.0 - 100.0 (fL)    MCH 28.0  26.0 - 34.0 (pg)    MCHC 31.7  30.0 - 36.0 (g/dL)    RDW 18.3 (*) 11.5 - 15.5 (%)    Platelets 172  150 - 400 (K/uL)   DIFFERENTIAL     Status: Normal   Collection Time   01/31/12  7:25 AM      Component Value Range Comment   Neutrophils Relative 75  43 - 77 (%)    Neutro Abs 6.5  1.7 - 7.7 (K/uL)    Lymphocytes Relative 14  12 - 46 (%)    Lymphs Abs 1.2  0.7 - 4.0 (K/uL)    Monocytes Relative 6  3 - 12 (%)    Monocytes Absolute 0.6  0.1 - 1.0 (K/uL)    Eosinophils Relative 4  0 - 5 (%)    Eosinophils Absolute 0.3  0.0 - 0.7 (K/uL)    Basophils  Relative 1  0 - 1 (%)    Basophils Absolute 0.1  0.0 - 0.1 (K/uL)   COMPREHENSIVE METABOLIC PANEL     Status: Abnormal   Collection Time   01/31/12  7:25 AM      Component Value Range Comment   Sodium 133 (*) 135 - 145 (mEq/L)    Potassium 4.8  3.5 - 5.1 (mEq/L)    Chloride 88 (*) 96 - 112 (mEq/L)    CO2 25  19 - 32 (mEq/L)    Glucose, Bld 357 (*) 70 - 99 (mg/dL)    BUN 60 (*) 6 - 23 (mg/dL)    Creatinine, Ser 11.41 (*) 0.50 - 1.35 (mg/dL)    Calcium 9.1  8.4 - 10.5 (mg/dL)    Total Protein 8.0  6.0 - 8.3 (g/dL)    Albumin 3.4 (*) 3.5 - 5.2 (g/dL)    AST 20  0 - 37 (U/L) HEMOLYSIS AT THIS LEVEL MAY AFFECT RESULT   ALT 15  0 - 53 (U/L)    Alkaline Phosphatase 195 (*) 39 - 117 (U/L)    Total Bilirubin 0.3  0.3 - 1.2 (mg/dL)    GFR calc non Af Amer 5 (*) >90 (mL/min)    GFR calc Af Amer 6 (*) >90 (mL/min)   LIPASE, BLOOD     Status: Abnormal   Collection Time   01/31/12  7:25 AM      Component Value Range Comment   Lipase 61 (*) 11 - 59 (U/L)   PROTIME-INR     Status: Normal   Collection Time   01/31/12  7:25 AM      Component Value Range Comment   Prothrombin Time 12.4  11.6 - 15.2 (seconds)    INR 0.91  0.00 - 1.49    APTT     Status: Normal   Collection Time   01/31/12  7:25 AM      Component Value Range  Comment   aPTT 30  24 - 37 (seconds)   POCT I-STAT 3, BLOOD GAS (G3P V)     Status: Abnormal   Collection Time   01/31/12  9:22 AM      Component Value Range Comment   pH, Ven 7.318 (*) 7.250 - 7.300     pCO2, Ven 54.7 (*) 45.0 - 50.0 (mmHg)    pO2, Ven 55.0 (*) 30.0 - 45.0 (mmHg)    Bicarbonate 28.1 (*) 20.0 - 24.0 (mEq/L)    TCO2 30  0 - 100 (mmol/L)    O2 Saturation 85.0      Acid-Base Excess 1.0  0.0 - 2.0 (mmol/L)    Sample type VENOUS     GLUCOSE, CAPILLARY     Status: Abnormal   Collection Time   01/31/12  4:28 PM      Component Value Range Comment   Glucose-Capillary 229 (*) 70 - 99 (mg/dL)    Comment 1 Notify RN     CBC     Status: Abnormal   Collection Time   01/31/12  4:59 PM      Component Value Range Comment   WBC 8.9  4.0 - 10.5 (K/uL)    RBC 4.16 (*) 4.22 - 5.81 (MIL/uL)    Hemoglobin 11.8 (*) 13.0 - 17.0 (g/dL)    HCT 36.2 (*) 39.0 - 52.0 (%)    MCV 87.0  78.0 - 100.0 (fL)    MCH 28.4  26.0 - 34.0 (pg)    MCHC 32.6  30.0 -  36.0 (g/dL)    RDW 17.8 (*) 11.5 - 15.5 (%)    Platelets 179  150 - 400 (K/uL)   CREATININE, SERUM     Status: Abnormal   Collection Time   01/31/12  4:59 PM      Component Value Range Comment   Creatinine, Ser 12.25 (*) 0.50 - 1.35 (mg/dL)    GFR calc non Af Amer 5 (*) >90 (mL/min)    GFR calc Af Amer 5 (*) >90 (mL/min)   GLUCOSE, CAPILLARY     Status: Abnormal   Collection Time   01/31/12  8:10 PM      Component Value Range Comment   Glucose-Capillary 206 (*) 70 - 99 (mg/dL)   GLUCOSE, CAPILLARY     Status: Abnormal   Collection Time   01/31/12  9:39 PM      Component Value Range Comment   Glucose-Capillary 188 (*) 70 - 99 (mg/dL)   URINALYSIS, ROUTINE W REFLEX MICROSCOPIC     Status: Abnormal   Collection Time   01/31/12  9:46 PM      Component Value Range Comment   Color, Urine YELLOW  YELLOW     APPearance CLOUDY (*) CLEAR     Specific Gravity, Urine 1.019  1.005 - 1.030     pH 6.0  5.0 - 8.0     Glucose, UA 500 (*) NEGATIVE (mg/dL)     Hgb urine dipstick SMALL (*) NEGATIVE     Bilirubin Urine NEGATIVE  NEGATIVE     Ketones, ur NEGATIVE  NEGATIVE (mg/dL)    Protein, ur >300 (*) NEGATIVE (mg/dL)    Urobilinogen, UA 0.2  0.0 - 1.0 (mg/dL)    Nitrite NEGATIVE  NEGATIVE     Leukocytes, UA SMALL (*) NEGATIVE    CLOSTRIDIUM DIFFICILE BY PCR     Status: Normal   Collection Time   01/31/12  9:46 PM      Component Value Range Comment   C difficile by pcr NEGATIVE  NEGATIVE    URINE RAPID DRUG SCREEN (HOSP PERFORMED)     Status: Abnormal   Collection Time   01/31/12  9:46 PM      Component Value Range Comment   Opiates NONE DETECTED  NONE DETECTED     Cocaine NONE DETECTED  NONE DETECTED     Benzodiazepines NONE DETECTED  NONE DETECTED     Amphetamines NONE DETECTED  NONE DETECTED     Tetrahydrocannabinol POSITIVE (*) NONE DETECTED     Barbiturates NONE DETECTED  NONE DETECTED    URINE MICROSCOPIC-ADD ON     Status: Abnormal   Collection Time   01/31/12  9:46 PM      Component Value Range Comment   Squamous Epithelial / LPF FEW (*) RARE     WBC, UA 3-6  <3 (WBC/hpf)    RBC / HPF 3-6  <3 (RBC/hpf)    Bacteria, UA RARE  RARE     Casts GRANULAR CAST (*) NEGATIVE     Urine-Other SPERM PRESENT     GLUCOSE, CAPILLARY     Status: Abnormal   Collection Time   02/01/12  8:35 AM      Component Value Range Comment   Glucose-Capillary 254 (*) 70 - 99 (mg/dL)    Comment 1 Documented in Chart      Comment 2 Notify RN     COMPREHENSIVE METABOLIC PANEL     Status: Abnormal   Collection Time   02/01/12  9:48 AM  Component Value Range Comment   Sodium 131 (*) 135 - 145 (mEq/L)    Potassium 4.9  3.5 - 5.1 (mEq/L)    Chloride 87 (*) 96 - 112 (mEq/L)    CO2 22  19 - 32 (mEq/L)    Glucose, Bld 280 (*) 70 - 99 (mg/dL)    BUN 69 (*) 6 - 23 (mg/dL)    Creatinine, Ser 13.25 (*) 0.50 - 1.35 (mg/dL)    Calcium 9.0  8.4 - 10.5 (mg/dL)    Total Protein 7.1  6.0 - 8.3 (g/dL)    Albumin 3.0 (*) 3.5 - 5.2 (g/dL)    AST 11  0 - 37 (U/L)     ALT 10  0 - 53 (U/L)    Alkaline Phosphatase 128 (*) 39 - 117 (U/L)    Total Bilirubin 0.3  0.3 - 1.2 (mg/dL)    GFR calc non Af Amer 4 (*) >90 (mL/min)    GFR calc Af Amer 5 (*) >90 (mL/min)   CBC     Status: Abnormal   Collection Time   02/01/12  9:48 AM      Component Value Range Comment   WBC 6.1  4.0 - 10.5 (K/uL)    RBC 4.05 (*) 4.22 - 5.81 (MIL/uL)    Hemoglobin 11.4 (*) 13.0 - 17.0 (g/dL)    HCT 34.8 (*) 39.0 - 52.0 (%)    MCV 85.9  78.0 - 100.0 (fL)    MCH 28.1  26.0 - 34.0 (pg)    MCHC 32.8  30.0 - 36.0 (g/dL)    RDW 17.4 (*) 11.5 - 15.5 (%)    Platelets 194  150 - 400 (K/uL)   GLUCOSE, CAPILLARY     Status: Abnormal   Collection Time   02/01/12 11:42 AM      Component Value Range Comment   Glucose-Capillary 284 (*) 70 - 99 (mg/dL)    Comment 1 Documented in Chart      Comment 2 Notify RN     CBC     Status: Abnormal   Collection Time   02/01/12  1:34 PM      Component Value Range Comment   WBC 6.8  4.0 - 10.5 (K/uL)    RBC 3.85 (*) 4.22 - 5.81 (MIL/uL)    Hemoglobin 10.8 (*) 13.0 - 17.0 (g/dL)    HCT 32.8 (*) 39.0 - 52.0 (%)    MCV 85.2  78.0 - 100.0 (fL)    MCH 28.1  26.0 - 34.0 (pg)    MCHC 32.9  30.0 - 36.0 (g/dL)    RDW 17.3 (*) 11.5 - 15.5 (%)    Platelets 198  150 - 400 (K/uL)   RENAL FUNCTION PANEL     Status: Abnormal   Collection Time   02/01/12  1:34 PM      Component Value Range Comment   Sodium 130 (*) 135 - 145 (mEq/L)    Potassium 5.1  3.5 - 5.1 (mEq/L)    Chloride 86 (*) 96 - 112 (mEq/L)    CO2 21  19 - 32 (mEq/L)    Glucose, Bld 278 (*) 70 - 99 (mg/dL)    BUN 70 (*) 6 - 23 (mg/dL)    Creatinine, Ser 13.55 (*) 0.50 - 1.35 (mg/dL)    Calcium 9.1  8.4 - 10.5 (mg/dL)    Phosphorus 9.9 (*) 2.3 - 4.6 (mg/dL)    Albumin 3.0 (*) 3.5 - 5.2 (g/dL)    GFR calc non Af  Amer 4 (*) >90 (mL/min)    GFR calc Af Amer 5 (*) >90 (mL/min)        Patient condition at time of discharge/disposition: stable  Disposition- home   Follow up issues: 1. Nausea,  Diarrhea - Continue to monitor and follow-up outpatient to see for complete resolution. 2. Dizziness - Reassess outpatient to see that there is complete resolution. 3. Diabetes s/p bilateral BKAs- Continue monitoring of blood sugar, insulin regimen and outpatient follow-up for monitoring and management of complications.  4. ESRD - Continue dialysis MWF. 5. Gastroparesis - Continue home Reglan and eat smaller meals. 6. HTN - Continue home meds and outpatient follow-up. 7. HLD - Continue home meds and outpatient follow-up. 8. GERD - Continue home Protonix.  Discharge follow up:  Follow-up Information    Follow up with Dorthey Sawyer, MD on 02/21/2012. (4:00 PM)         Discharge Orders    Future Appointments: Provider: Department: Dept Phone: Center:   02/21/2012 4:00 PM Dorthey Sawyer, MD [facility] [phone] [facility]      Jonetta Osgood, MD [facility] Teaching Service 02/01/2012 3:02 PM

## 2012-02-01 NOTE — Progress Notes (Signed)
Utilization review complete 

## 2012-02-21 ENCOUNTER — Ambulatory Visit: Payer: Medicare Other | Admitting: Family Medicine

## 2012-02-22 ENCOUNTER — Ambulatory Visit: Payer: Medicare Other | Admitting: Family Medicine

## 2012-03-21 ENCOUNTER — Other Ambulatory Visit: Payer: Self-pay

## 2012-03-21 DIAGNOSIS — T82898A Other specified complication of vascular prosthetic devices, implants and grafts, initial encounter: Secondary | ICD-10-CM

## 2012-03-22 ENCOUNTER — Other Ambulatory Visit: Payer: Self-pay

## 2012-03-22 DIAGNOSIS — T82898A Other specified complication of vascular prosthetic devices, implants and grafts, initial encounter: Secondary | ICD-10-CM

## 2012-03-30 ENCOUNTER — Ambulatory Visit: Payer: Medicare Other | Admitting: Vascular Surgery

## 2012-04-05 ENCOUNTER — Encounter: Payer: Self-pay | Admitting: Vascular Surgery

## 2012-04-06 ENCOUNTER — Encounter: Payer: Self-pay | Admitting: Surgery

## 2012-04-06 ENCOUNTER — Ambulatory Visit: Payer: Medicare Other | Admitting: Vascular Surgery

## 2012-04-09 ENCOUNTER — Ambulatory Visit: Payer: Medicare Other | Admitting: Surgery

## 2012-04-17 ENCOUNTER — Emergency Department (HOSPITAL_COMMUNITY): Payer: Medicare Other

## 2012-04-17 ENCOUNTER — Inpatient Hospital Stay (HOSPITAL_COMMUNITY)
Admission: EM | Admit: 2012-04-17 | Discharge: 2012-04-19 | DRG: 391 | Disposition: A | Discharge status: Home / Self Care | Payer: Medicare Other | Attending: Family Medicine | Admitting: Family Medicine

## 2012-04-17 ENCOUNTER — Encounter (HOSPITAL_COMMUNITY): Payer: Self-pay | Admitting: Emergency Medicine

## 2012-04-17 DIAGNOSIS — E1049 Type 1 diabetes mellitus with other diabetic neurological complication: Secondary | ICD-10-CM | POA: Diagnosis present

## 2012-04-17 DIAGNOSIS — K529 Noninfective gastroenteritis and colitis, unspecified: Secondary | ICD-10-CM | POA: Diagnosis present

## 2012-04-17 DIAGNOSIS — E1022 Type 1 diabetes mellitus with diabetic chronic kidney disease: Secondary | ICD-10-CM

## 2012-04-17 DIAGNOSIS — K219 Gastro-esophageal reflux disease without esophagitis: Secondary | ICD-10-CM | POA: Diagnosis present

## 2012-04-17 DIAGNOSIS — K3184 Gastroparesis: Secondary | ICD-10-CM | POA: Diagnosis present

## 2012-04-17 DIAGNOSIS — F339 Major depressive disorder, recurrent, unspecified: Secondary | ICD-10-CM | POA: Diagnosis present

## 2012-04-17 DIAGNOSIS — IMO0002 Reserved for concepts with insufficient information to code with codable children: Secondary | ICD-10-CM | POA: Diagnosis present

## 2012-04-17 DIAGNOSIS — Z79899 Other long term (current) drug therapy: Secondary | ICD-10-CM

## 2012-04-17 DIAGNOSIS — R109 Unspecified abdominal pain: Secondary | ICD-10-CM

## 2012-04-17 DIAGNOSIS — F4310 Post-traumatic stress disorder, unspecified: Secondary | ICD-10-CM | POA: Diagnosis present

## 2012-04-17 DIAGNOSIS — I120 Hypertensive chronic kidney disease with stage 5 chronic kidney disease or end stage renal disease: Secondary | ICD-10-CM | POA: Diagnosis present

## 2012-04-17 DIAGNOSIS — S88119A Complete traumatic amputation at level between knee and ankle, unspecified lower leg, initial encounter: Secondary | ICD-10-CM

## 2012-04-17 DIAGNOSIS — K226 Gastro-esophageal laceration-hemorrhage syndrome: Secondary | ICD-10-CM | POA: Diagnosis present

## 2012-04-17 DIAGNOSIS — A09 Infectious gastroenteritis and colitis, unspecified: Principal | ICD-10-CM | POA: Diagnosis present

## 2012-04-17 DIAGNOSIS — Z794 Long term (current) use of insulin: Secondary | ICD-10-CM

## 2012-04-17 DIAGNOSIS — E1165 Type 2 diabetes mellitus with hyperglycemia: Secondary | ICD-10-CM | POA: Diagnosis present

## 2012-04-17 DIAGNOSIS — N186 End stage renal disease: Secondary | ICD-10-CM | POA: Diagnosis present

## 2012-04-17 DIAGNOSIS — K5900 Constipation, unspecified: Secondary | ICD-10-CM | POA: Diagnosis present

## 2012-04-17 DIAGNOSIS — Z7982 Long term (current) use of aspirin: Secondary | ICD-10-CM

## 2012-04-17 DIAGNOSIS — Z9849 Cataract extraction status, unspecified eye: Secondary | ICD-10-CM

## 2012-04-17 DIAGNOSIS — E785 Hyperlipidemia, unspecified: Secondary | ICD-10-CM | POA: Diagnosis present

## 2012-04-17 DIAGNOSIS — G8929 Other chronic pain: Secondary | ICD-10-CM | POA: Diagnosis present

## 2012-04-17 HISTORY — DX: Encounter for other specified aftercare: Z51.89

## 2012-04-17 HISTORY — DX: Gastroparesis: K31.84

## 2012-04-17 HISTORY — DX: End stage renal disease: N18.6

## 2012-04-17 HISTORY — DX: Dependence on renal dialysis: Z99.2

## 2012-04-17 HISTORY — DX: Unspecified osteoarthritis, unspecified site: M19.90

## 2012-04-17 HISTORY — DX: Reserved for inherently not codable concepts without codable children: IMO0001

## 2012-04-17 LAB — BASIC METABOLIC PANEL
CO2: 29 mEq/L (ref 19–32)
Chloride: 90 mEq/L — ABNORMAL LOW (ref 96–112)
Glucose, Bld: 217 mg/dL — ABNORMAL HIGH (ref 70–99)
Potassium: 3.9 mEq/L (ref 3.5–5.1)
Sodium: 135 mEq/L (ref 135–145)

## 2012-04-17 LAB — GLUCOSE, CAPILLARY: Glucose-Capillary: 117 mg/dL — ABNORMAL HIGH (ref 70–99)

## 2012-04-17 LAB — HEPATIC FUNCTION PANEL
AST: 11 U/L (ref 0–37)
Albumin: 3.6 g/dL (ref 3.5–5.2)
Alkaline Phosphatase: 105 U/L (ref 39–117)
Total Bilirubin: 0.2 mg/dL — ABNORMAL LOW (ref 0.3–1.2)
Total Protein: 7.7 g/dL (ref 6.0–8.3)

## 2012-04-17 LAB — DIFFERENTIAL
Basophils Absolute: 0.1 10*3/uL (ref 0.0–0.1)
Lymphocytes Relative: 25 % (ref 12–46)
Lymphs Abs: 1.6 10*3/uL (ref 0.7–4.0)
Neutro Abs: 3.7 10*3/uL (ref 1.7–7.7)
Neutrophils Relative %: 59 % (ref 43–77)

## 2012-04-17 LAB — CBC
HCT: 37.8 % — ABNORMAL LOW (ref 39.0–52.0)
Hemoglobin: 12.6 g/dL — ABNORMAL LOW (ref 13.0–17.0)
MCV: 88.3 fL (ref 78.0–100.0)
WBC: 6.4 10*3/uL (ref 4.0–10.5)

## 2012-04-17 MED ORDER — ONDANSETRON HCL 4 MG PO TABS: 4.0000 mg | ORAL_TABLET | Freq: Three times a day (TID) | ORAL | Status: DC | PRN

## 2012-04-17 MED ORDER — ASPIRIN EC 81 MG PO TBEC
81.0000 mg | DELAYED_RELEASE_TABLET | Freq: Every day | ORAL | Status: DC
  Administered 2012-04-18 – 2012-04-19 (×2): 81 mg via ORAL
  Filled 2012-04-17 (×3): qty 1

## 2012-04-17 MED ORDER — MORPHINE SULFATE 4 MG/ML IJ SOLN
4.0000 mg | Freq: Once | INTRAMUSCULAR | Status: AC
  Administered 2012-04-17: 4 mg via INTRAVENOUS
  Filled 2012-04-17: qty 1

## 2012-04-17 MED ORDER — ASPIRIN 81 MG PO TBEC: 81.0000 mg | DELAYED_RELEASE_TABLET | Freq: Every day | ORAL | Status: DC

## 2012-04-17 MED ORDER — ONDANSETRON HCL 4 MG/2ML IJ SOLN
4.0000 mg | Freq: Three times a day (TID) | INTRAMUSCULAR | Status: DC
  Administered 2012-04-18 – 2012-04-19 (×4): 4 mg via INTRAVENOUS
  Filled 2012-04-17 (×4): qty 2

## 2012-04-17 MED ORDER — PROMETHAZINE HCL 12.5 MG PO TABS
12.5000 mg | ORAL_TABLET | Freq: Four times a day (QID) | ORAL | Status: DC
  Administered 2012-04-18 – 2012-04-19 (×2): 12.5 mg via ORAL
  Filled 2012-04-17 (×11): qty 1

## 2012-04-17 MED ORDER — INSULIN GLARGINE 100 UNIT/ML ~~LOC~~ SOLN: 20.0000 [IU] | Freq: Every day | SUBCUTANEOUS | Status: DC

## 2012-04-17 MED ORDER — SERTRALINE HCL 50 MG PO TABS
50.0000 mg | ORAL_TABLET | Freq: Every day | ORAL | Status: DC
  Administered 2012-04-18: 50 mg via ORAL
  Filled 2012-04-17 (×2): qty 1

## 2012-04-17 MED ORDER — ONDANSETRON HCL 4 MG/2ML IJ SOLN
INTRAMUSCULAR | Status: AC
  Filled 2012-04-17: qty 2

## 2012-04-17 MED ORDER — CALCIUM ACETATE 667 MG PO CAPS
667.0000 mg | ORAL_CAPSULE | Freq: Three times a day (TID) | ORAL | Status: DC
  Administered 2012-04-18 (×2): 667 mg via ORAL
  Filled 2012-04-17 (×4): qty 1

## 2012-04-17 MED ORDER — ACETAMINOPHEN 325 MG PO TABS: 650.0000 mg | ORAL_TABLET | Freq: Four times a day (QID) | ORAL | Status: DC | PRN

## 2012-04-17 MED ORDER — PROMETHAZINE HCL 25 MG PO TABS: 12.5000 mg | ORAL_TABLET | Freq: Four times a day (QID) | ORAL | Status: DC | PRN

## 2012-04-17 MED ORDER — SODIUM CHLORIDE 0.9 % IV SOLN: INTRAVENOUS | Status: DC

## 2012-04-17 MED ORDER — ACETAMINOPHEN 650 MG RE SUPP: 650.0000 mg | Freq: Four times a day (QID) | RECTAL | Status: DC | PRN

## 2012-04-17 MED ORDER — INSULIN GLARGINE 100 UNIT/ML ~~LOC~~ SOLN
10.0000 [IU] | Freq: Every day | SUBCUTANEOUS | Status: DC
  Administered 2012-04-18: 10 [IU] via SUBCUTANEOUS

## 2012-04-17 MED ORDER — INSULIN ASPART 100 UNIT/ML ~~LOC~~ SOLN
0.0000 [IU] | Freq: Three times a day (TID) | SUBCUTANEOUS | Status: DC
  Administered 2012-04-18: 3 [IU] via SUBCUTANEOUS
  Administered 2012-04-19: 8 [IU] via SUBCUTANEOUS

## 2012-04-17 MED ORDER — ONDANSETRON HCL 4 MG/2ML IJ SOLN
4.0000 mg | Freq: Once | INTRAMUSCULAR | Status: AC
  Administered 2012-04-17 – 2012-04-18 (×2): 4 mg via INTRAVENOUS
  Filled 2012-04-17: qty 2

## 2012-04-17 MED ORDER — INSULIN GLARGINE 100 UNIT/ML ~~LOC~~ SOLN: 15.0000 [IU] | Freq: Every day | SUBCUTANEOUS | Status: DC

## 2012-04-17 MED ORDER — LOSARTAN POTASSIUM 50 MG PO TABS: 100.0000 mg | ORAL_TABLET | Freq: Every day | ORAL | Status: DC

## 2012-04-17 MED ORDER — PROMETHAZINE HCL 25 MG/ML IJ SOLN
12.5000 mg | Freq: Once | INTRAMUSCULAR | Status: AC
  Administered 2012-04-17: 12.5 mg via INTRAVENOUS

## 2012-04-17 MED ORDER — PANTOPRAZOLE SODIUM 40 MG PO TBEC
40.0000 mg | DELAYED_RELEASE_TABLET | Freq: Two times a day (BID) | ORAL | Status: DC
  Administered 2012-04-18 – 2012-04-19 (×3): 40 mg via ORAL
  Filled 2012-04-17 (×2): qty 1

## 2012-04-17 MED ORDER — LABETALOL HCL 200 MG PO TABS
200.0000 mg | ORAL_TABLET | Freq: Two times a day (BID) | ORAL | Status: DC
  Administered 2012-04-18 – 2012-04-19 (×3): 200 mg via ORAL
  Filled 2012-04-17 (×5): qty 1

## 2012-04-17 MED ORDER — AMLODIPINE BESYLATE 5 MG PO TABS
5.0000 mg | ORAL_TABLET | Freq: Every day | ORAL | Status: DC
  Administered 2012-04-18: 5 mg via ORAL
  Filled 2012-04-17 (×2): qty 1

## 2012-04-17 MED ORDER — PANTOPRAZOLE SODIUM 40 MG PO TBEC: 40.0000 mg | DELAYED_RELEASE_TABLET | Freq: Every day | ORAL | Status: DC

## 2012-04-17 MED ORDER — METOCLOPRAMIDE HCL 10 MG PO TABS
10.0000 mg | ORAL_TABLET | Freq: Four times a day (QID) | ORAL | Status: DC
  Administered 2012-04-18 – 2012-04-19 (×4): 10 mg via ORAL
  Filled 2012-04-17 (×9): qty 1

## 2012-04-17 MED ORDER — PROMETHAZINE HCL 25 MG/ML IJ SOLN
12.5000 mg | Freq: Once | INTRAMUSCULAR | Status: AC
  Administered 2012-04-17: 12.5 mg via INTRAVENOUS
  Filled 2012-04-17: qty 1

## 2012-04-17 MED ORDER — ALUM & MAG HYDROXIDE-SIMETH 200-200-20 MG/5ML PO SUSP
30.0000 mL | Freq: Four times a day (QID) | ORAL | Status: DC | PRN
  Filled 2012-04-17: qty 30

## 2012-04-17 MED ORDER — LOSARTAN POTASSIUM 50 MG PO TABS
100.0000 mg | ORAL_TABLET | Freq: Every day | ORAL | Status: DC
  Administered 2012-04-18: 100 mg via ORAL
  Filled 2012-04-17 (×3): qty 2

## 2012-04-17 NOTE — H&P (Signed)
[facility] Admission History and Physical  Patient name: [name] Medical record number: [id number] Date of birth: 07-30-1976 Age: 36 y.o. Gender: male  Primary Care Provider: Dorthey Sawyer, MD, MD  Chief Complaint: Persistent vomiting and diarrhea  History of Present Illness: [name] is a 36 y.o. year old male presenting with persistent, non-bloody diarrhea that started on Sunday and was accompanied by retching, but no vomiting. On Monday he started vomiting food and then developed hematemesis in addition to the diarrhea. He describes his hematemesis as approximately 8 ounces, two times a day for the last two days.  Today, all of his symptoms persisted, and he felt too ill to go to hemodialysis. (Normally, he attends HD on Tu, Thurs, Sat @ [place]). He has had minimal food and water intake and has not taken his PO medication since Sunday. He has a history of gastroparesis for which he is prescribed metoclopramide. However, he has not had this medication for several weeks. Mr. Corinna Lines also notes a history of upper GI bleeding that was evaluated by a gastroenterologist. However, he does not remember the diagnosis that he was given and has not seen this physician regularly. He endorses intermittent shortness of breath. He denies fever, sick contacts, hematochezia, weakness, and cough.   Patient Active Problem List  Diagnoses  . DIABETES MELLITUS, TYPE I, UNCONTROLLED, WITH COMPLICATIONS  . HYPERLIPIDEMIA  . DEPRESSION, MAJOR, RECURRENT  . POST TRAUMATIC STRESS DISORDER  . HYPERTENSION, BENIGN SYSTEMIC  . GERD  . ESRD (end stage renal disease)  . IMPOTENCE, ORGANIC  . Constipation  . Amputation stump pain  . Nausea & vomiting  . Metabolic bone disease  . Gastroenteritis   Past Medical History: Past Medical History  Diagnosis Date  . Renal insufficiency   . Diabetes mellitus   . Chronic kidney disease, stage 4, severely decreased  GFR   . Hypertension   . GERD (gastroesophageal reflux disease)   . Gastropathy   . Dyslipidemia   . Chronic pain   . Depression   . Edema   . Headache   . Hyperlipidemia   . AMPUTATION, BELOW KNEE, HX OF 04/08/2008    Past Surgical History: Past Surgical History  Procedure Date  . Cataract surgery   . Bilateral bka   . Below knee leg amputation     Social History: History   Social History  . Marital Status: Single    Spouse Name: N/A    Number of Children: N/A  . Years of Education: N/A   Social History Main Topics  . Smoking status: Never Smoker   . Smokeless tobacco: None  . Alcohol Use: No  . Drug Use: 7 per week    Special: Marijuana     smokes pot  . Sexually Active: Not Currently   Other Topics Concern  . None   Social History Narrative  . None    Family History: Family History  Problem Relation Age of Onset  . Diabetes Other   . Hypertension Other   . Heart disease Other     Allergies: No Known Allergies  Current Facility-Administered Medications  Medication Dose Route Frequency Provider Last Rate Last Dose  . morphine 4 MG/ML injection 4 mg  4 mg Intravenous Once Tatyana A Kirichenko, PA   4 mg at 04/17/12 1343  . morphine 4 MG/ML injection 4 mg  4 mg Intravenous Once Otis Brace, PA   4 mg at 04/17/12 1635  . morphine  4 MG/ML injection 4 mg  4 mg Intravenous Once Otis Brace, PA   4 mg at 04/17/12 1950  . ondansetron (ZOFRAN) injection 4 mg  4 mg Intravenous Once Tatyana A Kirichenko, PA   4 mg at 04/17/12 1344  . promethazine (PHENERGAN) injection 12.5 mg  12.5 mg Intravenous Once Tatyana A Kirichenko, PA   12.5 mg at 04/17/12 1524  . promethazine (PHENERGAN) injection 12.5 mg  12.5 mg Intravenous Once Otis Brace, PA   12.5 mg at 04/17/12 1635   Current Outpatient Prescriptions  Medication Sig Dispense Refill  . amLODipine (NORVASC) 5 MG tablet Take 5 mg by mouth daily.       [name] aspirin 81 MG EC tablet Take 81 mg by mouth daily.          . calcium acetate (PHOSLO) 667 MG capsule Take 667 mg by mouth 3 (three) times daily with meals.      [name] esomeprazole (NEXIUM) 40 MG capsule Take 40 mg by mouth at bedtime.       . insulin aspart (NOVOLOG FLEXPEN) 100 UNIT/ML injection Inject 2-10 Units into the skin 3 (three) times daily. Sliding scale      . insulin glargine (LANTUS SOLOSTAR) 100 UNIT/ML injection Inject 20 Units into the skin at bedtime.       [name] labetalol (NORMODYNE) 200 MG tablet Take 200 mg by mouth 2 (two) times daily.       [name] losartan (COZAAR) 100 MG tablet Take 100 mg by mouth at bedtime.      . metoCLOPramide (REGLAN) 10 MG tablet Take 10 mg by mouth 4 (four) times daily.        . ondansetron (ZOFRAN) 4 MG tablet Take 4 mg by mouth every 8 (eight) hours as needed. For nausea      . pantoprazole (PROTONIX) 40 MG tablet Take 40 mg by mouth 2 (two) times daily.      . promethazine (PHENERGAN) 12.5 MG tablet Take 12.5 mg by mouth every 6 (six) hours as needed. For nausea      . sertraline (ZOLOFT) 50 MG tablet Take 50 mg by mouth daily.      [name] glucagon 1 MG injection Follow package directions for low blood sugar.  1 each  1  . DISCONTD: amLODipine (NORVASC) 5 MG tablet Take 1 tablet (5 mg total) by mouth daily.  31 tablet  0  . DISCONTD: promethazine (PHENERGAN) 12.5 MG suppository Place 1 suppository (12.5 mg total) rectally every 6 (six) hours as needed for nausea.  12 each  0  . DISCONTD: promethazine (PHENERGAN) 12.5 MG tablet Take 2 tablets (25 mg total) by mouth every 8 (eight) hours as needed for nausea.  30 tablet  0   Review Of Systems: Per HPI with the following additions: none Otherwise 12 point review of systems was performed and was unremarkable.  Physical Exam: BP 164/93  Pulse 72  Temp(Src) 98.1 F (36.7 C) (Oral)  Resp 18  SpO2 100% General: awake, cooperative, distressed, bilateral BKA HEENT: PERRLA, extra ocular movement intact and OP dry, nasal canula in place  Heart: S1, S2 normal, no murmur, rub  or gallop, regular rate and rhythm Lungs: clear to auscultation, no wheezes or rales and unlabored breathing Abdomen: abdominal guarding is present; generalized tenderness; non-distended; + BS Extremities: bilateral BKA; no edema or evidence of fluid overload; AV fistula LUE Skin:no rashes, no wounds Neurology: normal without focal findings, mental status, speech normal, alert and oriented x3  and PERLA  Labs and Imaging:  Results for orders placed during the hospital encounter of 04/17/12 (from the past 24 hour(s))  CBC     Status: Abnormal   Collection Time   04/17/12  1:43 PM      Component Value Range   WBC 6.4  4.0 - 10.5 (K/uL)   RBC 4.28  4.22 - 5.81 (MIL/uL)   Hemoglobin 12.6 (*) 13.0 - 17.0 (g/dL)   HCT 37.8 (*) 39.0 - 52.0 (%)   MCV 88.3  78.0 - 100.0 (fL)   MCH 29.4  26.0 - 34.0 (pg)   MCHC 33.3  30.0 - 36.0 (g/dL)   RDW 13.7  11.5 - 15.5 (%)   Platelets 199  150 - 400 (K/uL)  DIFFERENTIAL     Status: Abnormal   Collection Time   04/17/12  1:43 PM      Component Value Range   Neutrophils Relative 59  43 - 77 (%)   Neutro Abs 3.7  1.7 - 7.7 (K/uL)   Lymphocytes Relative 25  12 - 46 (%)   Lymphs Abs 1.6  0.7 - 4.0 (K/uL)   Monocytes Relative 8  3 - 12 (%)   Monocytes Absolute 0.5  0.1 - 1.0 (K/uL)   Eosinophils Relative 8 (*) 0 - 5 (%)   Eosinophils Absolute 0.5  0.0 - 0.7 (K/uL)   Basophils Relative 1  0 - 1 (%)   Basophils Absolute 0.1  0.0 - 0.1 (K/uL)  BASIC METABOLIC PANEL     Status: Abnormal   Collection Time   04/17/12  1:43 PM      Component Value Range   Sodium 135  135 - 145 (mEq/L)   Potassium 3.9  3.5 - 5.1 (mEq/L)   Chloride 90 (*) 96 - 112 (mEq/L)   CO2 29  19 - 32 (mEq/L)   Glucose, Bld 217 (*) 70 - 99 (mg/dL)   BUN 38 (*) 6 - 23 (mg/dL)   Creatinine, Ser 12.37 (*) 0.50 - 1.35 (mg/dL)   Calcium 9.3  8.4 - 10.5 (mg/dL)   GFR calc non Af Amer 5 (*) >90 (mL/min)   GFR calc Af Amer 5 (*) >90 (mL/min)  HEPATIC FUNCTION PANEL     Status: Abnormal    Collection Time   04/17/12  1:43 PM      Component Value Range   Total Protein 7.7  6.0 - 8.3 (g/dL)   Albumin 3.6  3.5 - 5.2 (g/dL)   AST 11  0 - 37 (U/L)   ALT 7  0 - 53 (U/L)   Alkaline Phosphatase 105  39 - 117 (U/L)   Total Bilirubin 0.2 (*) 0.3 - 1.2 (mg/dL)   Bilirubin, Direct <0.1  0.0 - 0.3 (mg/dL)   Indirect Bilirubin NOT CALCULATED  0.3 - 0.9 (mg/dL)  LIPASE, BLOOD     Status: Normal   Collection Time   04/17/12  1:43 PM      Component Value Range   Lipase 29  11 - 59 (U/L)  GLUCOSE, CAPILLARY     Status: Abnormal   Collection Time   04/17/12  6:42 PM      Component Value Range   Glucose-Capillary 145 (*) 70 - 99 (mg/dL)      Assessment and Plan: PETRA WHITTIKER is a 36 y.o. year old male with ESRD on HD, Type 1 DM, and gastroparesis presenting with diarrhea and hematemesis.   1. GI - The presentation is consistent with an  infectious gastroenteritis (viral more likely than bacterial) vs gastroparesis. The gastroparesis would not likely cause the profuse diarrhea. The hematochezia would be resulting from an esophageal tear secondary to retching. However, his Hb is stable, so it is not likely that he has been losing a significant amount of blood each day.  - Treat symptomatically with phenergan, zofran, and reglan - C. Diff PCR given risk factor  - rehydrate with NS 50 ml/hr  - consider GI consultation for evaluation of hematochezia  - repeat CBC in AM to assess blood loss  - Clear liquid diet   2. Renal - The patient missed dialysis today. However, he has no signs of volume overload which is likely secondary to GI losses.  - consult Nephrology to set up HD for tomorrow - continue home calcium acetate as tolerated   3. Endocrine -  Lantus decrease from 20 units to 15 units; Moderate sliding scale with no HS coverage since patient with po intake  4. CV - continue home anti-hypertensive meds labetalol, losartan, and norvasc  5. Psych - continue home sertraline    6. GERD - protonix  7. Disposition - Admit the patient to the [facility] Medicine Teaching Service under Dr. McDiarmid. ----------------------------------------------------------------------------------------------------------------------- Patient seen and examined. Agree with above PGY one assessment and plan.  Patient seen and examined agree with above PGY one note. Please see PGY one note for full details. Briefly this is 36 year old male with a known history of type 1 diabetes, gastroparesis, end-stage renal disease with hemodialysis on Tuesday Thursday Saturday as well as known vascular disease status post bilateral BKA's presenting with nausea vomiting and diarrhea x4-5 days. Patient states he was in his otherwise baseline state health prior to 4-5 days ago when patient began to have persistent nausea and vomiting as well as diarrhea. Patient states his vomiting has progressed to include intermittent hematemesis. Last episode hematemesis was earlier today patient states that bloody emesis was 5 approximately an 8-10 ounce cup. This is not progressively worsened. Patient states that he has had hematemesis in the past and has had a formal endoscopy in the past. Patient is unsure of the time frame of this. Patient denies any recent NSAID use. Patient states that he has missed one day of dialysis secondary to his illness. Patient denies any feeling of volume overload as he has been actively vomiting and having diarrhea. Patient states he has not been able to tolerate any solids for the last 3 days and has not been able to tolerate any liquids for the last 24 hours. Diarrhea is non bloody nonbilious. Patient denies any fevers though does report some mild chills. No chest pain, shortness of breath. Patient is also does report some mild abdominal pain. No dysuria. No rash. No headache. Patient denies any sick contacts. Patient has a baseline history of gastroparesis. Patient states that he last took his Reglan  approximately 2 weeks ago as his prescription ran out. Physical Exam: BP 164/93  Pulse 72  Temp(Src) 98.1 F (36.7 C) (Oral)  Resp 18  SpO2 100% General: awake, cooperative, distressed, bilateral BKA HEENT: PERRLA, extra ocular movement intact and OP dry, nasal canula in place  Heart: S1, S2 normal, no murmur, rub or gallop, regular rate and rhythm Lungs: clear to auscultation, no wheezes or rales and unlabored breathing Abdomen: abdominal guarding is present; generalized tenderness; non-distended; + BS Extremities: bilateral BKA; no edema or evidence of fluid overload; AV fistula LUE Skin:no rashes, no wounds Neurology: normal without focal findings, mental  status, speech normal, alert and oriented x3 and PERLA      Gastrointestinal: Relatively broad differential for this including acute gastroenteritis of viral sources. Other etiologies include gastroparesis as well as other infectious sources. From an upper GI standpoint will obtain a gastric ulcer as well as a formal GI consult for upper endoscopy. Hemoglobin is at 12.6 today which is above a recent hemoglobin of 10.8 approximately 2 months ago although there is likely some hemoconcentration reflecting current hemoglobin. We'll trend hemoglobins with every 12 hours CBCs. We'll avoid anticoagulation given bleeding risk. We'll also restart patient on home Reglan. From a lower GI standpoint will obtain stool cultures C. difficile fecal lactoferrin stool ova and parasites. Will defer antibacterial coverage as there is no leukocytosis or noted elevated temperature on presentation although there is a low clinical threshold to begin treatment. Will also start on scheduled anti-emetics Renal: Patient has missed his most recent hemodialysis day. Patient is clinically volume overloaded. [facility] consult nephrology formally. Will continue home nephrology  medications. Cardiovascular: Patient is unable to tolerate his home medications secondary to nausea  and vomiting. Will restart home medications in the setting of schedule antibiotics. Patient cannot tolerate home medications we'll convert to IV forms. Psych: Continue home medications. PPX: Heparin, protonix Dispo: Pending further evaluation

## 2012-04-17 NOTE — ED Provider Notes (Signed)
History     CSN: [id number]  Arrival date & time 04/17/12  1252   First MD Initiated Contact with Patient 04/17/12 1254      Chief Complaint  Patient presents with  . Emesis    (Consider location/radiation/quality/duration/timing/severity/associated sxs/prior treatment) Patient is a 36 y.o. male presenting with vomiting. The history is provided by the patient.  Emesis  This is a new problem. The current episode started 2 days ago. The problem occurs continuously. The problem has not changed since onset.The emesis has an appearance of stomach contents and bright red blood. There has been no fever. Associated symptoms include abdominal pain, chills, diarrhea, myalgias and sweats. Pertinent negatives include no cough and no fever.  Pt reports nausea, vomiting, diarrhea, upper abdominal pain for the last 2 days. States unable to keep anything down. Pt is a dialysis pt, missed his dialysis this morning. CBG 126 prior to the arrival. Denies fever, chills. Denies chest pain, but states he has been feeling some shortness of breath.   Past Medical History  Diagnosis Date  . Renal insufficiency   . Diabetes mellitus   . Chronic kidney disease, stage 4, severely decreased GFR   . Hypertension   . GERD (gastroesophageal reflux disease)   . Gastropathy   . Dyslipidemia   . Chronic pain   . Depression   . Edema   . Headache   . Hyperlipidemia   . 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 Cataract surgery   . Bilateral bka   . Below knee leg amputation     Family History  Problem Relation Age of Onset  . Diabetes Other   . Hypertension Other   . Heart disease Other     History  Substance Use Topics  . Smoking status: Never Smoker   . Smokeless tobacco: Not on file  . Alcohol Use: No      Review of Systems  Constitutional: Positive for chills. Negative for fever.  Respiratory: Negative for cough.   Gastrointestinal: Positive for  vomiting, abdominal pain and diarrhea.  Musculoskeletal: Positive for myalgias.    Allergies  Review of patient's allergies indicates no known allergies.  Home Medications   Current Outpatient Rx  Name Route Sig Dispense Refill  . ACETAMINOPHEN 325 MG PO TABS Oral Take 2 tablets (650 mg total) by mouth every 6 (six) hours as needed (or Fever >/= 101). 30 tablet 3  . AMLODIPINE BESYLATE 5 MG PO TABS Oral Take 5 mg by mouth daily.     [name] AMLODIPINE BESYLATE 5 MG PO TABS Oral Take 1 tablet (5 mg total) by mouth daily. 31 tablet 0  . ASPIRIN 81 MG PO TBEC Oral Take 81 mg by mouth daily.      [name] ESOMEPRAZOLE MAGNESIUM 40 MG PO CPDR Oral Take 40 mg by mouth at bedtime.     [name] GLUCAGON (RDNA) 1 MG IJ KIT  Follow package directions for low blood sugar. 1 each 1  . INSULIN ASPART 100 UNIT/ML [place] SOLN Subcutaneous Inject 2-10 Units into the skin 3 (three) times daily. Sliding scale    . INSULIN GLARGINE 100 UNIT/ML [place] SOLN Subcutaneous Inject 20 Units into the skin at bedtime.     [name] LABETALOL HCL 200 MG PO TABS Oral Take 200 mg by mouth 2 (two) times daily.     [name] LOSARTAN POTASSIUM 100 MG PO TABS Oral Take 100 mg by mouth at bedtime.    [name]  METOCLOPRAMIDE HCL 10 MG PO TABS Oral Take 10 mg by mouth 4 (four) times daily.      [name] ONDANSETRON HCL 4 MG PO TABS Oral Take 4 mg by mouth every 8 (eight) hours as needed. For nausea    . PANTOPRAZOLE SODIUM 40 MG PO TBEC Oral Take 40 mg by mouth 2 (two) times daily.    [name] PROMETHAZINE HCL 12.5 MG RE SUPP Rectal Place 1 suppository (12.5 mg total) rectally every 6 (six) hours as needed for nausea. 12 each 0  . PROMETHAZINE HCL 12.5 MG PO TABS Oral Take 12.5 mg by mouth every 6 (six) hours as needed. For nausea    . PROMETHAZINE HCL 12.5 MG PO TABS Oral Take 2 tablets (25 mg total) by mouth every 8 (eight) hours as needed for nausea. 30 tablet 0  . SERTRALINE HCL 50 MG PO TABS Oral Take 1 tablet (50 mg total) by mouth daily. 30 tablet 6  . SIMVASTATIN 10 MG PO TABS  Oral Take 10 mg by mouth at bedtime.        There were no vitals taken for this visit.  Physical Exam  Nursing note and vitals reviewed. Constitutional: He is oriented to person, place, and time. He appears well-developed and well-nourished.       Actively vomiting  HENT:  Head: Normocephalic and atraumatic.  Eyes: Conjunctivae are normal.  Neck: Neck supple.  Cardiovascular: Normal rate, regular rhythm and normal heart sounds.   Pulmonary/Chest: Effort normal and breath sounds normal. No respiratory distress. He has no wheezes.  Abdominal: Soft. Bowel sounds are normal. He exhibits no distension. There is no rebound and no guarding.       Diffuse mild tenderness  Musculoskeletal:       Bilateral BKAs  Neurological: He is alert and oriented to person, place, and time.  Skin: Skin is warm and dry.  Psychiatric: He has a normal mood and affect.    ED Course  Procedures (including critical care time)  Pt with N/V for last two days. Actively vomiting in ED. Unable to keep food down at home.   Results for orders placed during the hospital encounter of 04/17/12  CBC      Component Value Range   WBC 6.4  4.0 - 10.5 (K/uL)   RBC 4.28  4.22 - 5.81 (MIL/uL)   Hemoglobin 12.6 (*) 13.0 - 17.0 (g/dL)   HCT 37.8 (*) 39.0 - 52.0 (%)   MCV 88.3  78.0 - 100.0 (fL)   MCH 29.4  26.0 - 34.0 (pg)   MCHC 33.3  30.0 - 36.0 (g/dL)   RDW 13.7  11.5 - 15.5 (%)   Platelets 199  150 - 400 (K/uL)  DIFFERENTIAL      Component Value Range   Neutrophils Relative 59  43 - 77 (%)   Neutro Abs 3.7  1.7 - 7.7 (K/uL)   Lymphocytes Relative 25  12 - 46 (%)   Lymphs Abs 1.6  0.7 - 4.0 (K/uL)   Monocytes Relative 8  3 - 12 (%)   Monocytes Absolute 0.5  0.1 - 1.0 (K/uL)   Eosinophils Relative 8 (*) 0 - 5 (%)   Eosinophils Absolute 0.5  0.0 - 0.7 (K/uL)   Basophils Relative 1  0 - 1 (%)   Basophils Absolute 0.1  0.0 - 0.1 (K/uL)  BASIC METABOLIC PANEL      Component Value Range   Sodium 135  135 - 145  (mEq/L)  Potassium 3.9  3.5 - 5.1 (mEq/L)   Chloride 90 (*) 96 - 112 (mEq/L)   CO2 29  19 - 32 (mEq/L)   Glucose, Bld 217 (*) 70 - 99 (mg/dL)   BUN 38 (*) 6 - 23 (mg/dL)   Creatinine, Ser 12.37 (*) 0.50 - 1.35 (mg/dL)   Calcium 9.3  8.4 - 10.5 (mg/dL)   GFR calc non Af Amer 5 (*) >90 (mL/min)   GFR calc Af Amer 5 (*) >90 (mL/min)  HEPATIC FUNCTION PANEL      Component Value Range   Total Protein 7.7  6.0 - 8.3 (g/dL)   Albumin 3.6  3.5 - 5.2 (g/dL)   AST 11  0 - 37 (U/L)   ALT 7  0 - 53 (U/L)   Alkaline Phosphatase 105  39 - 117 (U/L)   Total Bilirubin 0.2 (*) 0.3 - 1.2 (mg/dL)   Bilirubin, Direct PENDING  0.0 - 0.3 (mg/dL)   Indirect Bilirubin PENDING  0.3 - 0.9 (mg/dL)  LIPASE, BLOOD      Component Value Range   Lipase 29  11 - 59 (U/L)   Dg Chest 2 View  04/17/2012  *RADIOLOGY REPORT*  Clinical Data: Chest pain.  CHEST - 2 VIEW  Comparison: PA and lateral chest 01/31/2012 and 11/15/2010.  Findings: The lungs are clear.  Heart size is normal.  There is no pneumothorax or pleural fluid.  No focal bony abnormality.  IMPRESSION: Negative chest.  Original Report Authenticated By: Arvid Right. Luther Parody, M.D.   Pt continues to complain of nausea after zofran. I ordered phenergan. Abdomen soft, no evidence of acute abdomen. Will try phenergan and reasses. Pt has had prior admissions for this as well.   3:59 PM Pt received phenergan, but continues to have nausea. He is no longer vomiting in ED. There are no significant lab abnormalities. WIll monitor and try to get nausea under control. If improved and able to keep fluids down, will d/c home. If fails, then admit. Discussed plan with PA [place]. Will place in CDU.    No diagnosis found.    [place], [place] 04/19/12 2241

## 2012-04-17 NOTE — ED Notes (Signed)
Per EMS: pt from home c/o N/V starting last night; pt sts some vomit with BRB; pt witnessed vomiting for EMS yellow bile only; pt c/o generalized body aches; pt bilateral BKA and dialysis and last dialysis was Saturday; IV 20g R AC

## 2012-04-17 NOTE — ED Notes (Signed)
63 RN unable to take report at this time

## 2012-04-17 NOTE — ED Provider Notes (Signed)
3:37 PM Received sign out from [employer], [employer].  Patient to move to CDU for nausea control, CXR.  Patient has hx Type 1 DM, on dialysis.  Missed his dialysis today but does not need dialysis emergently.  Plan if for attempt to control N/V in CDU, if symptoms resolve, patient to be d/c home, if not, patient to be admitted.    4:28 PM Patient reports he is no longer vomiting but continues to dry heave.  I discussed the plan with him and he states he does not think he will be able to go home because he has not eaten in 3 days.  C/O abdominal pain and nausea.  On exam, pt is A&Ox4, NAD, RRR, CTAB, abd soft, nondistended, diffusely tender, no guarding, no rebound.  Will order more pain and nausea medication.  Will continue to follow.    5:33 PM Patient continues to feel queasy.  Declines PO challenge at this time.    6:27 PM Patient reports he is feeling worse than when he came in, currently feels like his equilibrium is off, and if he tried to stand up he might fall over.  Anion gap is 16.   6:43 PM Admitted to [facility] for intractable abdominal pain, N/V.    Otis Brace, [place] 04/17/12 1844

## 2012-04-17 NOTE — ED Notes (Signed)
Pt given 4mg  zofran in route with effect

## 2012-04-18 ENCOUNTER — Encounter (HOSPITAL_COMMUNITY): Payer: Self-pay | Admitting: General Practice

## 2012-04-18 ENCOUNTER — Inpatient Hospital Stay (HOSPITAL_COMMUNITY): Payer: Medicare Other

## 2012-04-18 DIAGNOSIS — E860 Dehydration: Secondary | ICD-10-CM

## 2012-04-18 DIAGNOSIS — N186 End stage renal disease: Secondary | ICD-10-CM

## 2012-04-18 DIAGNOSIS — R112 Nausea with vomiting, unspecified: Secondary | ICD-10-CM

## 2012-04-18 DIAGNOSIS — E1029 Type 1 diabetes mellitus with other diabetic kidney complication: Secondary | ICD-10-CM

## 2012-04-18 DIAGNOSIS — Z992 Dependence on renal dialysis: Secondary | ICD-10-CM

## 2012-04-18 HISTORY — DX: Dependence on renal dialysis: Z99.2

## 2012-04-18 LAB — GLUCOSE, CAPILLARY
Glucose-Capillary: 95 mg/dL (ref 70–99)
Glucose-Capillary: 199 mg/dL — ABNORMAL HIGH (ref 70–99)
Glucose-Capillary: 248 mg/dL — ABNORMAL HIGH (ref 70–99)

## 2012-04-18 LAB — BASIC METABOLIC PANEL
CO2: 26 mEq/L (ref 19–32)
Chloride: 93 mEq/L — ABNORMAL LOW (ref 96–112)
GFR calc Af Amer: 5 mL/min — ABNORMAL LOW (ref >90)
Potassium: 3.2 mEq/L — ABNORMAL LOW (ref 3.5–5.1)
Sodium: 138 mEq/L (ref 135–145)

## 2012-04-18 LAB — CBC
Platelets: 210 10*3/uL (ref 150–400)
RBC: 3.81 MIL/uL — ABNORMAL LOW (ref 4.22–5.81)
RDW: 13.9 % (ref 11.5–15.5)
WBC: 5.8 10*3/uL (ref 4.0–10.5)

## 2012-04-18 MED ORDER — HEPARIN SODIUM (PORCINE) 1000 UNIT/ML DIALYSIS
20.0000 [IU]/kg | INTRAMUSCULAR | Status: DC | PRN
  Filled 2012-04-18: qty 2

## 2012-04-18 MED ORDER — CAMPHOR-MENTHOL 0.5-0.5 % EX LOTN
1.0000 "application " | TOPICAL_LOTION | Freq: Three times a day (TID) | CUTANEOUS | Status: DC | PRN
  Filled 2012-04-18: qty 222

## 2012-04-18 MED ORDER — ONDANSETRON HCL 4 MG/2ML IJ SOLN
INTRAMUSCULAR | Status: AC
  Administered 2012-04-18: 4 mg via INTRAVENOUS
  Filled 2012-04-18: qty 2

## 2012-04-18 MED ORDER — CALCIUM ACETATE 667 MG PO CAPS
2001.0000 mg | ORAL_CAPSULE | Freq: Three times a day (TID) | ORAL | Status: DC
  Administered 2012-04-18: 2001 mg via ORAL
  Administered 2012-04-19 (×2): 667 mg via ORAL
  Filled 2012-04-18 (×5): qty 3

## 2012-04-18 MED ORDER — SODIUM CHLORIDE 0.9 % IV SOLN: 125.0000 mg | INTRAVENOUS | Status: DC

## 2012-04-18 MED ORDER — ONDANSETRON HCL 4 MG/2ML IJ SOLN
4.0000 mg | Freq: Four times a day (QID) | INTRAMUSCULAR | Status: DC | PRN
  Filled 2012-04-18: qty 2

## 2012-04-18 MED ORDER — LIDOCAINE-PRILOCAINE 2.5-2.5 % EX CREA: 1.0000 "application " | TOPICAL_CREAM | CUTANEOUS | Status: DC | PRN

## 2012-04-18 MED ORDER — HEPARIN SODIUM (PORCINE) 1000 UNIT/ML DIALYSIS
1000.0000 [IU] | INTRAMUSCULAR | Status: DC | PRN
  Filled 2012-04-18: qty 1

## 2012-04-18 MED ORDER — PENTAFLUOROPROP-TETRAFLUOROETH EX AERO: 1.0000 "application " | INHALATION_SPRAY | CUTANEOUS | Status: DC | PRN

## 2012-04-18 MED ORDER — SORBITOL 70 % SOLN: 30.0000 mL | Status: DC | PRN

## 2012-04-18 MED ORDER — SODIUM CHLORIDE 0.9 % IV SOLN: 100.0000 mL | INTRAVENOUS | Status: DC | PRN

## 2012-04-18 MED ORDER — NEPRO/CARBSTEADY PO LIQD: 237.0000 mL | ORAL | Status: DC | PRN

## 2012-04-18 MED ORDER — ACETAMINOPHEN 325 MG PO TABS: 650.0000 mg | ORAL_TABLET | Freq: Four times a day (QID) | ORAL | Status: DC | PRN

## 2012-04-18 MED ORDER — ALTEPLASE 2 MG IJ SOLR: 2.0000 mg | Freq: Once | INTRAMUSCULAR | Status: AC | PRN

## 2012-04-18 MED ORDER — CALCIUM CARBONATE 1250 MG/5ML PO SUSP: 500.0000 mg | Freq: Four times a day (QID) | ORAL | Status: DC | PRN

## 2012-04-18 MED ORDER — AMLODIPINE BESYLATE 5 MG PO TABS
5.0000 mg | ORAL_TABLET | Freq: Every day | ORAL | Status: DC
  Filled 2012-04-18: qty 1

## 2012-04-18 MED ORDER — PARICALCITOL 5 MCG/ML IV SOLN: 2.0000 ug | INTRAVENOUS | Status: DC

## 2012-04-18 MED ORDER — DOCUSATE SODIUM 283 MG RE ENEM: 1.0000 | ENEMA | RECTAL | Status: DC | PRN

## 2012-04-18 MED ORDER — ACETAMINOPHEN 650 MG RE SUPP: 650.0000 mg | Freq: Four times a day (QID) | RECTAL | Status: DC | PRN

## 2012-04-18 MED ORDER — HYDROXYZINE HCL 25 MG PO TABS: 25.0000 mg | ORAL_TABLET | Freq: Three times a day (TID) | ORAL | Status: DC | PRN

## 2012-04-18 MED ORDER — RENA-VITE PO TABS
1.0000 | ORAL_TABLET | Freq: Every day | ORAL | Status: DC
  Administered 2012-04-18 – 2012-04-19 (×2): 1 via ORAL
  Filled 2012-04-18 (×2): qty 1

## 2012-04-18 MED ORDER — LIDOCAINE HCL (PF) 1 % IJ SOLN: 5.0000 mL | INTRAMUSCULAR | Status: DC | PRN

## 2012-04-18 MED ORDER — ZOLPIDEM TARTRATE 5 MG PO TABS: 5.0000 mg | ORAL_TABLET | Freq: Every evening | ORAL | Status: DC | PRN

## 2012-04-18 MED ORDER — ONDANSETRON HCL 4 MG PO TABS: 4.0000 mg | ORAL_TABLET | Freq: Four times a day (QID) | ORAL | Status: DC | PRN

## 2012-04-18 NOTE — Significant Event (Addendum)
Dr Sheral Apley called ear irrigation gently with H2O2 AND N on rt ear performed with good results after ear wax removed perforation noted to ear drum . Lt ear then examined perforation noted to that ear drum as well. MD informed

## 2012-04-18 NOTE — Consult Note (Signed)
[facility] Renal Consultation Note    Indication for Consultation:  Management of ESRD/hemodialysis; anemia, hypertension/volume and secondary hyperparathyroidism  HPI: [name] is a 36 y.o. 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 He has not had diarrhea since admission and vomiting has resolved.  He is eating lunch without any nausea.  He is not taking zoloft - doesn't think he needs it; he is out of most BP and GI meds and won't have any money until the first of the month.  He is almost out of Lantus as well and has little money for food.  He lives with a cousin and has little support. He was initially SOB upon admission, but not now. His a some abdominal pain which he thinks is due to vomiting and dry heaves.  He denied fever, chlls, cough or overt weakness  Dialysis Orders: Center: [facility]  on TTS EDW 67 HD Bath 2K 2.5 Ca  Time 4 Heparin 6600. Access  Left upper AVGG BFR 400 DFR 800    Zemplar 2 mcg IV/per week -Tues Epogen none; Hgb 11.4 4/18   Units IV/HD  Venofer  100/week -Tues Other  Past Medical History  Diagnosis Date  . Renal insufficiency   . Diabetes mellitus   . Chronic kidney disease, stage 4, severely decreased GFR   . Hypertension   . GERD (gastroesophageal reflux disease)   . Gastropathy   . Dyslipidemia   . Chronic pain   . Depression   . Edema   . Headache   . Hyperlipidemia   . AMPUTATION, BELOW KNEE, HX OF 04/08/2008   Past Surgical History  Procedure Date  . Cataract surgery   . Bilateral bka   . Below knee leg amputation    Family History  Problem Relation Age of Onset  . Diabetes Other   . Hypertension Other   . Heart disease Other    Social History:  reports that he has never smoked. He does not have any smokeless tobacco history on file. He reports that he uses illicit drugs (Marijuana) about 7 times per week. He reports that he does not  drink alcohol. No Known Allergies Prior to Admission medications   Medication Sig Start Date End Date Taking? Authorizing Provider  amLODipine (NORVASC) 5 MG tablet Take 5 mg by mouth daily.    Yes Historical Provider, MD  aspirin 81 MG EC tablet Take 81 mg by mouth daily.     Yes Historical Provider, MD  calcium acetate (PHOSLO) 667 MG capsule Take 667 mg by mouth 3 (three) times daily with meals.   Yes Historical Provider, MD  esomeprazole (NEXIUM) 40 MG capsule Take 40 mg by mouth at bedtime.    Yes Historical Provider, MD  insulin aspart (NOVOLOG FLEXPEN) 100 UNIT/ML injection Inject 2-10 Units into the skin 3 (three) times daily. Sliding scale   Yes Historical Provider, MD  insulin glargine (LANTUS SOLOSTAR) 100 UNIT/ML injection Inject 20 Units into the skin at bedtime.    Yes Historical Provider, MD  labetalol (NORMODYNE) 200 MG tablet Take 200 mg by mouth 2 (two) times daily.    Yes Historical Provider, MD  losartan (COZAAR) 100 MG tablet Take 100 mg by mouth at bedtime.   Yes Historical Provider, MD  metoCLOPramide (REGLAN) 10 MG tablet Take 10 mg by mouth 4 (four) times daily.     Yes Historical Provider, MD  ondansetron [facility])  4 MG tablet Take 4 mg by mouth every 8 (eight) hours as needed. For nausea   Yes Historical Provider, MD  pantoprazole (PROTONIX) 40 MG tablet Take 40 mg by mouth 2 (two) times daily.   Yes Historical Provider, MD  promethazine (PHENERGAN) 12.5 MG tablet Take 12.5 mg by mouth every 6 (six) hours as needed. For nausea   Yes Historical Provider, MD  sertraline (ZOLOFT) 50 MG tablet Take 50 mg by mouth daily.   Yes Historical Provider, MD  glucagon 1 MG injection Follow package directions for low blood sugar. 04/13/11 04/12/12  Donnal Moat, MD   Current Facility-Administered Medications  Medication Dose Route Frequency Provider Last Rate Last Dose  . acetaminophen (TYLENOL) tablet 650 mg  650 mg Oral Q6H PRN Angelica Ran, MD       Or  . acetaminophen  (TYLENOL) suppository 650 mg  650 mg Rectal Q6H PRN Angelica Ran, MD      . alum & mag hydroxide-simeth (MAALOX/MYLANTA) 200-200-20 MG/5ML suspension 30 mL  30 mL Oral Q6H PRN Angelica Ran, MD      . amLODipine (NORVASC) tablet 5 mg  5 mg Oral Daily Angelica Ran, MD   5 mg at 04/18/12 0931  . aspirin EC tablet 81 mg  81 mg Oral Daily Blane Ohara McDiarmid, MD   81 mg at 04/18/12 0930  . calcium acetate (PHOSLO) capsule 667 mg  667 mg Oral TID WC Angelica Ran, MD   667 mg at 04/18/12 0930  . insulin aspart (novoLOG) injection 0-15 Units  0-15 Units Subcutaneous TID WC Angelica Ran, MD      . insulin glargine (LANTUS) injection 10 Units  10 Units Subcutaneous QHS Angelica Ran, MD      . labetalol (NORMODYNE) tablet 200 mg  200 mg Oral BID Angelica Ran, MD   200 mg at 04/18/12 0930  . losartan (COZAAR) tablet 100 mg  100 mg Oral QHS Todd D McDiarmid, MD      . metoCLOPramide (REGLAN) tablet 10 mg  10 mg Oral QID Angelica Ran, MD   10 mg at 04/18/12 0930  . morphine 4 MG/ML injection 4 mg  4 mg Intravenous Once Tatyana A Kirichenko, PA   4 mg at 04/17/12 1343  . morphine 4 MG/ML injection 4 mg  4 mg Intravenous Once Otis Brace, PA   4 mg at 04/17/12 1635  . morphine 4 MG/ML injection 4 mg  4 mg Intravenous Once Otis Brace, PA   4 mg at 04/17/12 1950  . ondansetron (ZOFRAN) injection 4 mg  4 mg Intravenous Once Tatyana A Kirichenko, PA   4 mg at 04/17/12 1344  . ondansetron (ZOFRAN) injection 4 mg  4 mg Intravenous Q8H Angelica Ran, MD   4 mg at 04/18/12 0001  . ondansetron (ZOFRAN) tablet 4 mg  4 mg Oral Q8H PRN Angelica Ran, MD      . pantoprazole (PROTONIX) EC tablet 40 mg  40 mg Oral BID Angelica Ran, MD      . promethazine (PHENERGAN) injection 12.5 mg  12.5 mg Intravenous Once Tatyana A Kirichenko, PA   12.5 mg at 04/17/12 1524  . promethazine (PHENERGAN) injection 12.5 mg  12.5 mg Intravenous Once Otis Brace, PA   12.5  mg at 04/17/12 1635  . promethazine (PHENERGAN) tablet 12.5 mg  12.5 mg Oral Q6H Deneise Lever, MD      .  sertraline (ZOLOFT) tablet 50 mg  50 mg Oral Daily Angelica Ran, MD   50 mg at 04/18/12 0931   Labs: Basic Metabolic Panel:  Lab [id number] 04/17/12 1343  NA 138 135  K 3.2* 3.9  CL 93* 90*  CO2 26 29  GLUCOSE 96 217*  BUN 42* 38*  CREATININE 13.61* 12.37*  CALCIUM 9.0 9.3  ALB -- --  PHOS -- --   Liver Function Tests:  Lab 04/17/12 1343  AST 11  ALT 7  ALKPHOS 105  BILITOT 0.2*  PROT 7.7  ALBUMIN 3.6    Lab 04/17/12 1343  LIPASE 29  AMYLASE --   No results found for this basename: AMMONIA:3 in the last 168 hours CBC:  Lab 04/18/12 0455 04/17/12 1343  WBC 5.8 6.4  NEUTROABS -- 3.7  HGB 11.1* 12.6*  HCT 33.7* 37.8*  MCV 88.5 88.3  PLT 210 199  CBG:  Lab 04/18/12 1123 04/18/12 0739 04/17/12 2210 04/17/12 1842  GLUCAP 199* 95 117* 145*   Studies/Results: Dg Chest 2 View  04/17/2012  *RADIOLOGY REPORT*  Clinical Data: Chest pain.  CHEST - 2 VIEW  Comparison: PA and lateral chest 01/31/2012 and 11/15/2010.  Findings: The lungs are clear.  Heart size is normal.  There is no pneumothorax or pleural fluid.  No focal bony abnormality.  IMPRESSION: Negative chest.  Original Report Authenticated By: Arvid Right. D'ALESSIO, M.D.   ROS: As per HPI  Physical Exam: Wt 68 (EDW 67) Filed Vitals:   04/17/12 1844 04/17/12 2054 04/17/12 2200 04/18/12 0712  BP: 170/98 164/93  181/92  Pulse: 77 72  81  Temp:  98.1 F (36.7 C)  97.6 F (36.4 C)  TempSrc:    Oral  Resp:  18  18  Height:   6' (1.829 m)   Weight:   68 kg (149 lb 14.6 oz)   SpO2: 100% 100%  100%     General: Well developed, slender AA male, in no acute distress, pleasant Head: Normocephalic, atraumatic, sclera non-icteric, mucus membranes are moist Neck: Supple. JVD not elevated. Lungs: Clear bilaterally to auscultation without wheezes, rales, or rhonchi. Breathing is unlabored. Heart: RRR  with S1 S2. No murmurs, rubs, or gallops appreciated. Abdomen: Soft, slightly tender, non-distended with normoactive bowel sounds. No rebound/guarding. M-S:  Strength and tone appear normal for age. Lower extremities: bilateral BKAs; no edema; extensive upper extremity tatoos Neuro: Alert and oriented X 3. Moves all extremities spontaneously. Psych:  Responds to questions appropriately with a normal affect. Dialysis Access: left upper AVGG; bruit radiates to the heart  Dialysis Orders: [place]: [place]  on TTS EDW 67 HD [facility] 2K 2.5 Ca  Time 4 Heparin 6600. Access  Left upper AVGG BFR 400 DFR 800    Zemplar 2 mcg IV/per week -Tues Epogen none; Hgb 11.4 4/18   Units IV/HD  Venofer  100/week -Tues  Assessment/Plan 1. Vomiting (with hematemesis) and diarrhea  - appears to be resolving with medications; etiology unclear; Out of usual GI meds 2. ESRD - TTS - HD today off schedule due to missed yesterday; K low probably from vomiting and diarrhea; tight heparin 3. Hypertension/volume  - while BP is high, it is better than outpt HD center; watch trends here; ? Compliance with outpt meds; if weights are correct he is 1 kg above prior EDW; have changed norvasc to HS; already got dose today. 4. Anemia  -Hgb running mid 11s to 12 at outpt HD; ESA on hold; maintenance weekly  Iv Fe 5. Metabolic bone disease - last iPTH < 200; zemplar 2 mcg weekly; increase phoslo to 3 ac = outpt dose 6. Nutrition - change to renal carb mod - no spicy foods 7. Chronic pain/phantom limb pain -  8. Depression- will d/c zoloft - not taking 9. DM - gastroparesis; BS ok, on QID reglan, usual dose 10. Disposition - ? At discharge, can hospital provide BP, GI and Lantusmedications until he gets his month check  Myriam Jacobson, PA-C [facility] [phone] 04/18/2012, 12:24 PM   Patient seen and examined and agree with assessment and plan as above.  Kelly Splinter  MD [employer] [phone] pgr     [phone] cell 04/18/2012, 4:04 PM

## 2012-04-18 NOTE — Procedures (Signed)
I was present at this dialysis session. I have reviewed the session itself and made appropriate changes.   Kelly Splinter, MD [employer] 04/18/2012, 4:04 PM

## 2012-04-18 NOTE — H&P (Signed)
I have seen and examined this patient. I have discussed with Dr Sheral Apley and Dr Ernestina Patches.  I agree with their findings and plans as documented in their admission note.

## 2012-04-18 NOTE — Consult Note (Signed)
Reason for Consult: Hematemesis Referring Physician: Family practice teaching service [name] is an 36 y.o. male.  HPI: [name] is an unfortunate 36 year old man with past history significant for ESRD on HD, DM 1, bilateral BKA, GERD, gastroparesis who is admitted for nausea vomiting and hematemesis.  He had one day of severe clear vomiting- likely secondary to running out of Reglan for gastroparesis-  prior to noticing coffee ground vomit. He denies having any more vomiting episodes after admission. Reports that he had similar kind of episodes about 2 times before, and resolved without any intervention. Last one confined per charts was in June 2004 - when he did not have an EGD as there was no significant hemoglobin drop and he was hemodynamically stable. He denies using any NSAIDs, steroids, alcohol. Also had no recent change in diet. Denies any fever or recent weight loss. Had about 4 loose bowel movements prior to admission- no blood- but no more loose stools after admission. Normally has one bowel movement every other day. Had epigastric abdominal pain along with nausea vomiting- which he likely attributes to constant retching. The abdominal pain is much better now.  Last EGD was in February 2009 per Dr. Carlean Purl- when he was found to have Candida esophagitis and gastroparesis- which was appropriately treated.  Past Medical History  Diagnosis Date  . Renal insufficiency   . Diabetes mellitus   . Chronic kidney disease, stage 4, severely decreased GFR   . Hypertension   . GERD (gastroesophageal reflux disease)   . Gastropathy   . Dyslipidemia   . Chronic pain   . Depression   . Edema   . Headache   . Hyperlipidemia   . 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 Cataract surgery   . Bilateral bka   . Below knee leg amputation     Family History  Problem Relation Age of Onset  . Diabetes Other   . Hypertension Other   .  Heart disease Other     Social History:  reports that he has never smoked. He does not have any smokeless tobacco history on file. He reports that he uses illicit drugs (Marijuana) about 7 times per week. He reports that he does not drink alcohol.  Allergies: No Known Allergies  Medications: I have reviewed the patient's current medications.  Results for orders placed during the hospital encounter of 04/17/12 (from the past 48 hour(s))  CBC     Status: Abnormal   Collection Time   04/17/12  1:43 PM      Component Value Range Comment   WBC 6.4  4.0 - 10.5 (K/uL)    RBC 4.28  4.22 - 5.81 (MIL/uL)    Hemoglobin 12.6 (*) 13.0 - 17.0 (g/dL)    HCT 37.8 (*) 39.0 - 52.0 (%)    MCV 88.3  78.0 - 100.0 (fL)    MCH 29.4  26.0 - 34.0 (pg)    MCHC 33.3  30.0 - 36.0 (g/dL)    RDW 13.7  11.5 - 15.5 (%)    Platelets 199  150 - 400 (K/uL)   DIFFERENTIAL     Status: Abnormal   Collection Time   04/17/12  1:43 PM      Component Value Range Comment   Neutrophils Relative 59  43 - 77 (%)    Neutro Abs 3.7  1.7 - 7.7 (K/uL)    Lymphocytes Relative 25  12 - 46 (%)  Lymphs Abs 1.6  0.7 - 4.0 (K/uL)    Monocytes Relative 8  3 - 12 (%)    Monocytes Absolute 0.5  0.1 - 1.0 (K/uL)    Eosinophils Relative 8 (*) 0 - 5 (%)    Eosinophils Absolute 0.5  0.0 - 0.7 (K/uL)    Basophils Relative 1  0 - 1 (%)    Basophils Absolute 0.1  0.0 - 0.1 (K/uL)   BASIC METABOLIC PANEL     Status: Abnormal   Collection Time   04/17/12  1:43 PM      Component Value Range Comment   Sodium 135  135 - 145 (mEq/L)    Potassium 3.9  3.5 - 5.1 (mEq/L)    Chloride 90 (*) 96 - 112 (mEq/L)    CO2 29  19 - 32 (mEq/L)    Glucose, Bld 217 (*) 70 - 99 (mg/dL)    BUN 38 (*) 6 - 23 (mg/dL)    Creatinine, Ser 12.37 (*) 0.50 - 1.35 (mg/dL)    Calcium 9.3  8.4 - 10.5 (mg/dL)    GFR calc non Af Amer 5 (*) >90 (mL/min)    GFR calc Af Amer 5 (*) >90 (mL/min)   HEPATIC FUNCTION PANEL     Status: Abnormal   Collection Time   04/17/12   1:43 PM      Component Value Range Comment   Total Protein 7.7  6.0 - 8.3 (g/dL)    Albumin 3.6  3.5 - 5.2 (g/dL)    AST 11  0 - 37 (U/L)    ALT 7  0 - 53 (U/L)    Alkaline Phosphatase 105  39 - 117 (U/L)    Total Bilirubin 0.2 (*) 0.3 - 1.2 (mg/dL)    Bilirubin, Direct <0.1  0.0 - 0.3 (mg/dL) REPEATED TO VERIFY   Indirect Bilirubin NOT CALCULATED  0.3 - 0.9 (mg/dL)   LIPASE, BLOOD     Status: Normal   Collection Time   04/17/12  1:43 PM      Component Value Range Comment   Lipase 29  11 - 59 (U/L)   GLUCOSE, CAPILLARY     Status: Abnormal   Collection Time   04/17/12  6:42 PM      Component Value Range Comment   Glucose-Capillary 145 (*) 70 - 99 (mg/dL)   GLUCOSE, CAPILLARY     Status: Abnormal   Collection Time   04/17/12 10:10 PM      Component Value Range Comment   Glucose-Capillary 117 (*) 70 - 99 (mg/dL)    Comment 1 Notify RN     CBC     Status: Abnormal   Collection Time   04/18/12  4:55 AM      Component Value Range Comment   WBC 5.8  4.0 - 10.5 (K/uL)    RBC 3.81 (*) 4.22 - 5.81 (MIL/uL)    Hemoglobin 11.1 (*) 13.0 - 17.0 (g/dL)    HCT 33.7 (*) 39.0 - 52.0 (%)    MCV 88.5  78.0 - 100.0 (fL)    MCH 29.1  26.0 - 34.0 (pg)    MCHC 32.9  30.0 - 36.0 (g/dL)    RDW 13.9  11.5 - 15.5 (%)    Platelets 210  150 - 400 (K/uL)   BASIC METABOLIC PANEL     Status: Abnormal   Collection Time   04/18/12  4:55 AM      Component Value Range Comment   Sodium 138  135 - 145 (mEq/L)    Potassium 3.2 (*) 3.5 - 5.1 (mEq/L) DELTA CHECK NOTED   Chloride 93 (*) 96 - 112 (mEq/L)    CO2 26  19 - 32 (mEq/L)    Glucose, Bld 96  70 - 99 (mg/dL)    BUN 42 (*) 6 - 23 (mg/dL)    Creatinine, Ser 13.61 (*) 0.50 - 1.35 (mg/dL)    Calcium 9.0  8.4 - 10.5 (mg/dL)    GFR calc non Af Amer 4 (*) >90 (mL/min)    GFR calc Af Amer 5 (*) >90 (mL/min)   MRSA PCR SCREENING     Status: Normal   Collection Time   04/18/12  7:02 AM      Component Value Range Comment   MRSA by PCR NEGATIVE  NEGATIVE      GLUCOSE, CAPILLARY     Status: Normal   Collection Time   04/18/12  7:39 AM      Component Value Range Comment   Glucose-Capillary 95  70 - 99 (mg/dL)   GLUCOSE, CAPILLARY     Status: Abnormal   Collection Time   04/18/12 11:23 AM      Component Value Range Comment   Glucose-Capillary 199 (*) 70 - 99 (mg/dL)     Dg Chest 2 View  04/17/2012  *RADIOLOGY REPORT*  Clinical Data: Chest pain.  CHEST - 2 VIEW  Comparison: PA and lateral chest 01/31/2012 and 11/15/2010.  Findings: The lungs are clear.  Heart size is normal.  There is no pneumothorax or pleural fluid.  No focal bony abnormality.  IMPRESSION: Negative chest.  Original Report Authenticated By: Arvid Right. D'ALESSIO, M.D.    ROS:  As stated above in the HPI otherwise negative.  Blood pressure 159/98, pulse 82, temperature 98.6 F (37 C), temperature source Oral, resp. rate 20, height 6' (1.829 m), weight 68 kg (149 lb 14.6 oz), SpO2 94.00%.  PE: Gen: NAD, Alert and Oriented, getting hemodialysis at present HEENT:  [place]/AT, EOMI Neck: Supple, no LAD Lungs: CTA Bilaterally CV: RRR without M/G/R ABM: Soft, NTND, +BS Ext: No C/C/E. Bilateral BKA.  Assessment/Plan:  # Hematemesis: Resolved at present. Last 3 vomits without blood/coffee-ground color. Most likely from Mallory-Weiss tear due to severe vomiting- which usually resolves by itself and is almost always not life-threatening. He is hemodynamically stable and does not have significant drop of hemoglobin. Liver enzymes are within normal limits and there is no leukocytosis.  Coagulation profile normal. - Will discuss with Dr. Benson Norway and see if he thinks EGD is necessary at this point of time- which will be performed tomorrow if needed. - Continue anti-emetics and Reglan for now.  # DM 1  #Bilateral BKA  #ESRD on HD #GERD #Gastroparesis   PATEL,RAVI 04/18/2012, 3:08 PM    The patient had an EGD in 2009 with findings of Candidal esophagitis.  At that time he was  complaining of nausea, vomiting, and abdominal pain.  During this event he only complains of nausea/vomiting without abdominal pain.  He was off of his Reglan and there is a history of severe gastroparesis.  Most likely he had a MW tear and he is stable at this time.  I do not feel that he requires an EGD currently, however, if his symptoms recur further evaluation can be performed.  Will sign off at this time.  Call with any questions.

## 2012-04-18 NOTE — Progress Notes (Signed)
Discussed in rounds and seen earlier today by Dr. McDiarmid as attending.  Agree with management.

## 2012-04-18 NOTE — ED Provider Notes (Signed)
Medical screening examination/treatment/procedure(s) were conducted as a shared visit with non-physician practitioner(s) and myself.  I personally evaluated the patient during the encounter  Leota Jacobsen, MD 04/18/12 [phone]

## 2012-04-18 NOTE — Progress Notes (Signed)
RN called MD to notify of TM perforation found following irrigation of the right ear.  Patient does describe intermittent "shocks" in bilaterally ears.  Denies any loss of hearing, ear drainage, or recent trauma.  Has not used Q-tips in over a year.  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  [name], [name] 04/18/2012, 2:19 PM

## 2012-04-18 NOTE — Progress Notes (Signed)
PCP note   I have seen the patient and I have reviewed the plan of care.  I agree with the inpatient team's current management.  Please let me know if I can help with patient care in any way.  I appreciate the inpatient team's excellent care of this patient.   Auburn Hester S. Luberta Mutter, MD Family Medicine Residency Program PGY-3

## 2012-04-18 NOTE — Progress Notes (Signed)
PGY-1 Daily Progress Note Family Medicine Teaching Service Basya Casavant M. Fredi Hurtado, MD Service Pager: [phone]  Subjective: Patient doing well this morning. Tolerating breakfast with no nausea or vomiting. No reports of diarrhea since admission. Patient would like his ears irrigated since this was not done in the ED prior to admission.  Objective: Vital signs in last 24 hours: Temp:  [97.6 F (36.4 C)-98.8 F (37.1 C)] 97.6 F (36.4 C) (04/24 0712) Pulse Rate:  [72-93] 81  (04/24 0712) Resp:  [15-20] 18  (04/24 0712) BP: (164-186)/(92-103) 181/92 mmHg (04/24 0712) SpO2:  [99 %-100 %] 100 % (04/24 0712) Weight:  [149 lb 14.6 oz (68 kg)] 149 lb 14.6 oz (68 kg) (04/23 2200) Weight change:     Intake/Output from previous day:   Intake/Output this shift: Total I/O In: 240 [P.O.:240] Out: -   General: awake, cooperative, no distress, bilateral BKA  HEENT: AT,  Heart: regular rate and rhythm, ?systolic murmur Lungs: clear to auscultation, no wheezes or rales and unlabored breathing  Abdomen: abdominal guarding is present; generalized tenderness; non-distended; + BS  Extremities: bilateral BKA; no edema or evidence of fluid overload; AV fistula LUE. PIV RUE Skin:no rashes, no wounds  Neurology: normal without focal findings  Lab Results:  Basename 04/18/12 0455 04/17/12 1343  WBC 5.8 6.4  HGB 11.1* 12.6*  HCT 33.7* 37.8*  PLT 210 199   BMET  Basename 04/18/12 0455 04/17/12 1343  NA 138 135  K 3.2* 3.9  CL 93* 90*  CO2 26 29  GLUCOSE 96 217*  BUN 42* 38*  CREATININE 13.61* 12.37*  CALCIUM 9.0 9.3    Studies/Results: Dg Chest 2 View  04/17/2012  *RADIOLOGY REPORT*  Clinical Data: Chest pain.  CHEST - 2 VIEW  Comparison: PA and lateral chest 01/31/2012 and 11/15/2010.  Findings: The lungs are clear.  Heart size is normal.  There is no pneumothorax or pleural fluid.  No focal bony abnormality.  IMPRESSION: Negative chest.  Original Report Authenticated By: Arvid Right.  Luther Parody, M.D.    Medications:  I have reviewed the patient's current medications. Scheduled:   . amLODipine  5 mg Oral Daily  . aspirin EC  81 mg Oral Daily  . calcium acetate  667 mg Oral TID WC  . insulin aspart  0-15 Units Subcutaneous TID WC  . insulin glargine  10 Units Subcutaneous QHS  . labetalol  200 mg Oral BID  . losartan  100 mg Oral QHS  . metoCLOPramide  10 mg Oral QID  . morphine  4 mg Intravenous Once  . morphine  4 mg Intravenous Once  . morphine  4 mg Intravenous Once  . ondansetron (ZOFRAN) IV  4 mg Intravenous Once  . ondansetron (ZOFRAN) IV  4 mg Intravenous Q8H  . pantoprazole  40 mg Oral BID  . promethazine  12.5 mg Intravenous Once  . promethazine  12.5 mg Intravenous Once  . promethazine  12.5 mg Oral Q6H  . sertraline  50 mg Oral Daily  . DISCONTD: aspirin  81 mg Oral Daily  . DISCONTD: insulin glargine  15 Units Subcutaneous QHS  . DISCONTD: insulin glargine  20 Units Subcutaneous QHS  . DISCONTD: losartan  100 mg Oral QHS  . DISCONTD: pantoprazole  40 mg Oral Daily   Continuous:   . DISCONTD: sodium chloride     [id number], acetaminophen, alum & mag hydroxide-simeth, ondansetron, DISCONTD: promethazine  Assessment/Plan: DANTE KEETCH is a 36 y.o. year old male with ESRD on HD,  Type 1 DM, and gastroparesis presenting with diarrhea and hematemesis.   1. GI - The presentation is consistent with an infectious gastroenteritis (viral more likely than bacterial) vs gastroparesis. The reported hematemesis wculd be resulting from an esophageal tear secondary to retching. However, his Hb is stable, so it is not likely that he has been losing a significant amount of blood each day as reported. No diarrhea or vomiting since admission. Hemoccult and Gastroccult have not been collected. Patient tolerating PO food and liquids while receiving Zofran and Reglan.  - Continue to treat symptomatically with phenergan, zofran, and reglan  - Will consider  C. Diff PCR given risk factor, although diarrhea has improved - GI consultation called today for evaluation of hematochezia- We appreciate Dr. Lorie Apley input for the care of this patietn - Continue bland diet for now - Repeat CBC in the AM to evaluate for blood loss anemia  2. Renal - The patient missed dialysis yesterday. BP elevated; creat 13.6 today. No signs of fluid overload - Awaiting Nephrology consult to arrange HD - Continue home calcium acetate as tolerated  - No IVF since he is taking decent PO at this time  3. Endocrine - Known diabetic. CBG ranging 100's-200's with fasting of 96 this morning - Lantus decreased from 20 units to 15 units - Moderate sliding scale with no HS coverage - Continue to monitor  4. CV - continue home anti-hypertensive meds labetalol, losartan, and norvasc - Awaiting dialysis today after missing session yesterday. BP 181/92  5. Psych - Continue home sertraline   6. FEN/GI- Bland diet for now, will advance to renal diet. Protonix 40mg  daily  7. Disposition - Pending clinical improvement    LOS: 1 day   Franciscojavier Wronski 04/18/2012, 9:56 AM

## 2012-04-19 ENCOUNTER — Encounter: Payer: Self-pay | Admitting: Home Health Services

## 2012-04-19 ENCOUNTER — Inpatient Hospital Stay (HOSPITAL_COMMUNITY): Payer: Medicare Other

## 2012-04-19 LAB — CBC
Hemoglobin: 10.7 g/dL — ABNORMAL LOW (ref 13.0–17.0)
MCH: 29.2 pg (ref 26.0–34.0)
MCV: 89.3 fL (ref 78.0–100.0)
RBC: 3.66 MIL/uL — ABNORMAL LOW (ref 4.22–5.81)
WBC: 6.2 10*3/uL (ref 4.0–10.5)

## 2012-04-19 LAB — GLUCOSE, CAPILLARY
Glucose-Capillary: 253 mg/dL — ABNORMAL HIGH (ref 70–99)
Glucose-Capillary: 127 mg/dL — ABNORMAL HIGH (ref 70–99)

## 2012-04-19 LAB — BASIC METABOLIC PANEL
CO2: 27 mEq/L (ref 19–32)
Calcium: 8.3 mg/dL — ABNORMAL LOW (ref 8.4–10.5)
Chloride: 91 mEq/L — ABNORMAL LOW (ref 96–112)
Creatinine, Ser: 9.99 mg/dL — ABNORMAL HIGH (ref 0.50–1.35)
Glucose, Bld: 295 mg/dL — ABNORMAL HIGH (ref 70–99)

## 2012-04-19 LAB — HEMOGLOBIN A1C: Hgb A1c MFr Bld: 11.8 % — ABNORMAL HIGH (ref <5.7)

## 2012-04-19 MED ORDER — PANTOPRAZOLE SODIUM 40 MG PO TBEC: 40.0000 mg | DELAYED_RELEASE_TABLET | Freq: Two times a day (BID) | ORAL | Status: DC

## 2012-04-19 MED ORDER — INSULIN ASPART 100 UNIT/ML ~~LOC~~ SOLN: 2.0000 [IU] | Freq: Three times a day (TID) | SUBCUTANEOUS | Status: DC

## 2012-04-19 MED ORDER — INSULIN GLARGINE 100 UNIT/ML ~~LOC~~ SOLN: 15.0000 [IU] | Freq: Every day | SUBCUTANEOUS | Status: DC

## 2012-04-19 MED ORDER — CALCIUM ACETATE 667 MG PO CAPS: 667.0000 mg | ORAL_CAPSULE | Freq: Three times a day (TID) | ORAL | Status: DC

## 2012-04-19 MED ORDER — DARBEPOETIN ALFA-POLYSORBATE 25 MCG/0.42ML IJ SOLN
INTRAMUSCULAR | Status: AC
  Administered 2012-04-19: 25 ug via INTRAVENOUS
  Filled 2012-04-19: qty 0.42

## 2012-04-19 MED ORDER — ONDANSETRON HCL 4 MG PO TABS: 4.0000 mg | ORAL_TABLET | Freq: Three times a day (TID) | ORAL | Status: DC | PRN

## 2012-04-19 MED ORDER — INSULIN GLARGINE 100 UNIT/ML ~~LOC~~ SOLN: 20.0000 [IU] | Freq: Every day | SUBCUTANEOUS | Status: DC

## 2012-04-19 MED ORDER — HEPARIN SODIUM (PORCINE) 1000 UNIT/ML DIALYSIS
20.0000 [IU]/kg | INTRAMUSCULAR | Status: DC | PRN
  Administered 2012-04-19: 1400 [IU] via INTRAVENOUS_CENTRAL
  Filled 2012-04-19: qty 2

## 2012-04-19 MED ORDER — AMLODIPINE BESYLATE 5 MG PO TABS: 5.0000 mg | ORAL_TABLET | Freq: Every day | ORAL | Status: DC

## 2012-04-19 MED ORDER — LOSARTAN POTASSIUM 100 MG PO TABS: 100.0000 mg | ORAL_TABLET | Freq: Every day | ORAL | Status: DC

## 2012-04-19 MED ORDER — INSULIN DETEMIR 100 UNIT/ML ~~LOC~~ SOLN: 20.0000 [IU] | Freq: Every day | SUBCUTANEOUS | Status: DC

## 2012-04-19 MED ORDER — METOCLOPRAMIDE HCL 10 MG PO TABS: 10.0000 mg | ORAL_TABLET | Freq: Four times a day (QID) | ORAL | Status: DC

## 2012-04-19 MED ORDER — AMLODIPINE BESYLATE 10 MG PO TABS
10.0000 mg | ORAL_TABLET | Freq: Every day | ORAL | Status: DC
  Filled 2012-04-19: qty 1

## 2012-04-19 MED ORDER — LABETALOL HCL 200 MG PO TABS: 200.0000 mg | ORAL_TABLET | Freq: Two times a day (BID) | ORAL | Status: DC

## 2012-04-19 MED ORDER — DARBEPOETIN ALFA-POLYSORBATE 25 MCG/0.42ML IJ SOLN
25.0000 ug | INTRAMUSCULAR | Status: DC
  Administered 2012-04-19: 25 ug via INTRAVENOUS
  Filled 2012-04-19: qty 0.42

## 2012-04-19 NOTE — Progress Notes (Signed)
Patient ID: [name], male   DOB: 1976/01/17, 36 y.o.   MRN: [id number] PGY-1 Daily Progress Note Family Medicine Teaching Service Andrew Perusse M. Amador Braddy, MD Service Pager: [phone]  Subjective: Patient states he feels better this morning. No nausea, vomiting or diarrhea. Tolerating renal diet.   Of note, patient does not have all medications at home. He will not be able to afford medications until the beginning of the month.  Objective: Vital signs in last 24 hours: Temp:  [97.8 F (36.6 C)-99 F (37.2 C)] 97.8 F (36.6 C) (04/25 0442) Pulse Rate:  [78-87] 78  (04/25 0442) Resp:  [18-20] 18  (04/25 0442) BP: (129-171)/(55-100) 158/83 mmHg (04/25 0442) SpO2:  [94 %-98 %] 98 % (04/25 0442) Weight:  [150 lb 2.1 oz (68.1 kg)-151 lb 8 oz (68.72 kg)] 151 lb 8 oz (68.72 kg) (04/24 2138) Weight change: 3.5 oz (0.1 kg) Last BM Date: 04/18/12  Intake/Output from previous day: 04/24 0701 - 04/25 0700 In: 948 [P.O.:840; IV Piggyback:8] Out: [id number] [Urine:100] Intake/Output this shift:    General: awake, head covered by blanket which he did not remove for me Heart: regular rate and rhythm Lungs: clear to auscultation anterior Abd: Soft. nontender Extremities: bilateral BKA; AV fistula LUE. PIV RUE Skin:no rashes, no wounds  Neurology: unable to fully examine  Lab Results:  Basename 04/19/12 0450 04/18/12 0455  WBC 6.2 5.8  HGB 10.7* 11.1*  HCT 32.7* 33.7*  PLT 171 210   BMET  Basename 04/19/12 0450 04/18/12 0455  NA 132* 138  K 3.4* 3.2*  CL 91* 93*  CO2 27 26  GLUCOSE 295* 96  BUN 30* 42*  CREATININE 9.99* 13.61*  CALCIUM 8.3* 9.0    Studies/Results: Dg Chest 2 View  04/17/2012  *RADIOLOGY REPORT*  Clinical Data: Chest pain.  CHEST - 2 VIEW  Comparison: PA and lateral chest 01/31/2012 and 11/15/2010.  Findings: The lungs are clear.  Heart size is normal.  There is no pneumothorax or pleural fluid.  No focal bony abnormality.  IMPRESSION: Negative chest.  Original  Report Authenticated By: [name]. [name], M.D.    Medications:  I have reviewed the patient's current medications. Scheduled:    . amLODipine  5 mg Oral Daily  . aspirin EC  81 mg Oral Daily  . calcium acetate  2,001 mg Oral TID WC  . ferric gluconate (FERRLECIT/NULECIT) IV  125 mg Intravenous Q Tue-HD  . insulin aspart  0-15 Units Subcutaneous TID WC  . insulin glargine  10 Units Subcutaneous QHS  . labetalol  200 mg Oral BID  . losartan  100 mg Oral QHS  . metoCLOPramide  10 mg Oral QID  . multivitamin  1 tablet Oral Daily  . ondansetron (ZOFRAN) IV  4 mg Intravenous Once  . ondansetron (ZOFRAN) IV  4 mg Intravenous Q8H  . pantoprazole  40 mg Oral BID  . paricalcitol  2 mcg Intravenous Q Tue-HD  . promethazine  12.5 mg Oral Q6H  . DISCONTD: amLODipine  5 mg Oral Daily  . DISCONTD: calcium acetate  667 mg Oral TID WC  . DISCONTD: sertraline  50 mg Oral Daily   Continuous:  [id number] chloride, sodium chloride, acetaminophen, acetaminophen, alteplase, calcium carbonate (dosed in mg elemental calcium), camphor-menthol, docusate sodium, feeding supplement (NEPRO CARB STEADY), heparin, heparin, hydrOXYzine, lidocaine, lidocaine-prilocaine, ondansetron (ZOFRAN) IV, ondansetron, pentafluoroprop-tetrafluoroeth, sorbitol, zolpidem, DISCONTD: acetaminophen, DISCONTD: acetaminophen, DISCONTD: alum & mag hydroxide-simeth DISCONTD: ondansetron  Assessment/Plan: [name] is a 36 y.o. year  old male with ESRD on HD, Type 1 DM, and gastroparesis presenting with diarrhea and hematemesis.   1. GI - The presentation at admission was consistent with an infectious gastroenteritis (viral more likely than bacterial) vs gastroparesis. Hb is stable, so it is not likely that he has been losing a significant amount of blood each day as reported. No diarrhea or vomiting since admission. Hemoccult and Gastroccult have not been collected. Patient tolerating PO food and liquids while receiving  Zofran and Reglan. Overall much improved with no additional  - Continue to treat symptomatically with phenergan, zofran, and reglan  - GI consultation called yesterday for evaluation of hematochezia. Most likely Mallory-Weiss tear; no EGD recommended. Appreciate Dr. Ulyses Caldwell consult. - Continue bland diet for now, advance to renal as tolerated - Patient out of usual medications at home. Will consult case management about medication needs today.  2. Renal - The patient missed dialysis on Tuesday.  - Nephrology consulted and patient went for HD yesterday, which is off of his outpatient schedule. We appreciate nephrology consult. Will follow up notes about HD schedule. - BP elevated, but somewhat improved after HD. Continue home medications with dose modifications per nephrology - Continue home calcium acetate as tolerated  - No IVF since he is taking decent PO at this time  3. Endocrine - Known diabetic. CBG ranging 100's-200's. - Lantus decreased from 20 units to 15 units, but will increase dose increased again since he is eating - Moderate sliding scale with no HS coverage - Continue to monitor  4. CV - continue home anti-hypertensive meds labetalol, losartan, and norvasc - Dose changes per nephrology - Continue to monitor  5. Psych - Not taking home Zoloft, therefore d/c   6. FEN/GI- Bland diet for now, will advance to renal diet. Protonix 40mg  daily  7. Disposition - Pending clinical improvement. Will consult case management today about medication needs.   LOS: 2 days   [name] 04/19/2012, 7:23 AM

## 2012-04-19 NOTE — Progress Notes (Signed)
Seen and examined.  Feels significantly improved.  I favor DC today provided we can insure he has access to needed meds

## 2012-04-19 NOTE — Discharge Summary (Signed)
Physician Discharge Summary  Patient ID: [name] MRN: [id number] DOB/AGE: [birth year] 36 y.o.  Admit date: 04/17/2012 Discharge date: 04/19/2012  Admission Diagnoses: abdominal pain, vomiting, diarrhea  Discharge Diagnoses:  Active Problems:  DIABETES MELLITUS, TYPE I, UNCONTROLLED, WITH COMPLICATIONS  DEPRESSION, MAJOR, RECURRENT  ESRD (end stage renal disease)  Gastroenteritis   Discharged Condition: stable  Hospital Course: [name] is a 36 y.o. year old male with ESRD on HD, Type 1 DM, and gastroparesis presenting with diarrhea and hematemesis. In brief, his hospital problems are as follows:  1. GI - Hispresentation at admission was consistent with an infectious gastroenteritis (viral more likely than bacterial) vs gastroparesis. Hb has been stable, so it is not likely that he has been losing a significant amount of blood each day as reported. No diarrhea or vomiting during admission therefore hemoccult and gastroccult were not been collected. Patient tolerating PO food and liquids while receiving Zofran and Reglan. Overall much improved on day after admission. Given his complaint of Hematemesis, GI was consulted for evaluation. States it is most likely Mallory-Weiss tear; no EGD recommended. Continued to treat symptomatically with phenergan, zofran, and reglan.  Patient out of usual medications at home. Please see #6 below. Patient stable at discharge. Will continue taking reglan for gastroparesis and zofran for nausea. He will follow up with Dr. Luberta Mutter within the next 2 weeks, or earlier if symptoms return.   2. Renal - The patient missed dialysis on Tuesday prior to admission due to feeling poorly. Nephrology consulted and patient went for HD on Wednesday during hospitalization, which is off of his outpatient schedule. He returned to finish HD on Thursday, which put him back on his schedule and he will go to outpatient dialysis on Saturday. BP elevated, but somewhat  improved after HD. Continued on home medications. Continue home calcium acetate as tolerated, which he also had not filled at home.  3. Endocrine - Known diabetic; not in DKA on admission. CBG ranging 100's-200's.  Lantus decreased from 20 units to 10 units since he reported poor PO intake, but increased dose to home 20 units on day of discharge. He also required SSI. A1C checked during this hospitalization was 11.8, which is higher than it has been recently. This is most likely due to patient not taking insulin consistently.   4. CV - Continued on home anti-hypertensive meds labetalol, losartan, and norvasc.  Unsure when patient last took these medications at home, but states he will start taking them again. Please closely monitor outpatient blood pressure since it was elevated during this admission. Per Nephrology notes, his BP at outpatient dialysis has been stable.  5. Psych - Not taking home Zoloft, therefore this was not resumed during hospitalization. Patient feels he does not need this medication. Will defer to PCP if/when it should be restarted  6. Social- Patient states he does not have money to afford his medications. He is living with his cousin. He states he has some insulin left at home, but unsure exact amount of other medications. He states he can afford his medication at the first of the month. This is somewhat confusing since patient has Medicare and Medicaid. I contacted [facility] who informed me patient has not picked up medications anytime recently. Patient states if he had money, he would get his prescriptions. [facility] and had $16.60 donated from the indigent fund to pay for his medications. Patient accepted money. Would recommend PCP follow up with [facility] and/or patient  to confirm he picked up his prescriptions. He also required transportation home, which was arranged by social work.   Recommendations for follow up: - Please follow up  with patient and/or pharmacy to confirm he picked up medications and he is taking them - Discuss diabetes management with him again given his A1C of 11.8 - Please follow up blood pressures. Could consider increasing Norvasc to 10mg .  Consults: GI and nephrology  Significant Diagnostic Studies:  CBC    Component Value Date/Time   WBC 6.2 04/19/2012 0450   RBC 3.66* 04/19/2012 0450   HGB 10.7* 04/19/2012 0450   HCT 32.7* 04/19/2012 0450   PLT 171 04/19/2012 0450   MCV 89.3 04/19/2012 0450   MCH 29.2 04/19/2012 0450   MCHC 32.7 04/19/2012 0450   RDW 13.6 04/19/2012 0450   LYMPHSABS 1.6 04/17/2012 1343   MONOABS 0.5 04/17/2012 1343   EOSABS 0.5 04/17/2012 1343   BASOSABS 0.1 04/17/2012 1343   BMET    Component Value Date/Time   NA 132* 04/19/2012 0450   K 3.4* 04/19/2012 0450   CL 91* 04/19/2012 0450   CO2 27 04/19/2012 0450   GLUCOSE 295* 04/19/2012 0450   BUN 30* 04/19/2012 0450   CREATININE 9.99* 04/19/2012 0450   CALCIUM 8.3* 04/19/2012 0450   CALCIUM 8.4 09/08/2011 0746   GFRNONAA 6* 04/19/2012 0450   GFRAA 7* 04/19/2012 0450    Treatments: dialysis: Hemodialysis  Discharge Exam: Blood pressure 180/93, pulse 87, temperature 98.3 F (36.8 C), temperature source Oral, resp. rate 18, height 6' (1.829 m), weight 149 lb 11.1 oz (67.9 kg), SpO2 100.00%.  General: awake, answers questions appropriately Heart: regular rate and rhythm  Lungs: clear to auscultation anterior  Abd: Soft. nontender  Extremities: bilateral BKA; AV fistula LUE. PIV RUE  Skin:no rashes, no wounds  Neurology: unable to fully examine   Disposition: 01-Home or Self Care  Discharge Orders    Future Appointments: Provider: Department: Dept Phone: Center:   05/01/2012 8:45 AM Donnal Moat, MD [facility] [phone] [facility]     Medication List  As of 04/19/2012 10:44 PM   STOP taking these medications         esomeprazole 40 MG capsule      LANTUS SOLOSTAR 100 UNIT/ML injection         TAKE these  medications         amLODipine 5 MG tablet   Commonly known as: NORVASC   Take 1 tablet (5 mg total) by mouth daily.      aspirin 81 MG EC tablet   Take 81 mg by mouth daily.      calcium acetate 667 MG capsule   Commonly known as: PHOSLO   Take 1 capsule (667 mg total) by mouth 3 (three) times daily with meals.      glucagon 1 MG injection   Follow package directions for low blood sugar.      insulin aspart 100 UNIT/ML injection   Commonly known as: novoLOG   Inject 2-10 Units into the skin 3 (three) times daily. Sliding scale      insulin detemir 100 UNIT/ML injection   Commonly known as: LEVEMIR   Inject 20 Units into the skin at bedtime.      labetalol 200 MG tablet   Commonly known as: NORMODYNE   Take 1 tablet (200 mg total) by mouth 2 (two) times daily.      losartan 100 MG tablet   Commonly known as: COZAAR  Take 1 tablet (100 mg total) by mouth at bedtime.      metoCLOPramide 10 MG tablet   Commonly known as: REGLAN   Take 1 tablet (10 mg total) by mouth 4 (four) times daily.      ondansetron 4 MG tablet   Commonly known as: ZOFRAN   Take 1 tablet (4 mg total) by mouth every 8 (eight) hours as needed. For nausea      pantoprazole 40 MG tablet   Commonly known as: PROTONIX   Take 1 tablet (40 mg total) by mouth 2 (two) times daily.      promethazine 12.5 MG tablet   Commonly known as: PHENERGAN   Take 12.5 mg by mouth every 6 (six) hours as needed. For nausea      sertraline 50 MG tablet   Commonly known as: ZOLOFT   Take 50 mg by mouth daily.           Follow-up Information    Follow up with Dorthey Sawyer, MD on 05/01/2012. (at 8:45am. Please call to reschedule if this interferes with your dialysis)    Contact information:   [place] [phone]          Signed: Melrose Nakayama 04/19/2012, 10:44 PM

## 2012-04-19 NOTE — Progress Notes (Signed)
Discharge instructions were given to pt.also an envelop with money that was left by the family practice MD. For pt's medicine.transportation has been set up for pt.

## 2012-04-19 NOTE — Progress Notes (Signed)
Patient states he cannot afford his medications. He states he had them filled month before last at [facility]. Contacted pharmacy- Patient has not had medications filled consistently (ie- Last Lantus refill in October, Last Protonix refill in September.) Patient states he will have his medications filled and take them consistently but he cannot afford them at this time. Will contact [facility] to ask if we can fill his medications out of the indigent fund. According to my calculations, patient will need $16.60 to cover his medications at [facility].  Also, patient is requesting a ride home.  Melitza Metheny M. Redina Zeller, M.D. 04/19/2012 12:25 PM

## 2012-04-19 NOTE — Progress Notes (Signed)
Received request for assistance with medications. MD, please be aware that pt has Medicare and Medicaid with medication benefits, therefore CM is unable to assist with medications. All medication assistance limited to pt with NO insurance coverage or benefits. Jasmine Pang RN MPH Case Manager [phone]

## 2012-04-19 NOTE — Discharge Instructions (Signed)
What to do after you leave the hospital: [facility]  Recommended diet: renal diet  Recommended activity: activity as tolerated  Follow-up with Dr. Luberta Mutter on May 7. Please call [phone] if you cannot keep this appointment.   Other instructions: Continue dialysis as scheduled

## 2012-04-19 NOTE — Progress Notes (Signed)
[facility] Progress Note  Subjective:  Signed off early on HD Wed due to feeling bad and cramping. Agreeable to go today. Said he ate and drank a lot since yesterday so he will have some fluid to take off.  No nausea or vomiting.  Objective Filed Vitals:   04/18/12 1600 04/18/12 1800 04/18/12 2138 04/19/12 0442  BP: 133/55 171/93 158/84 158/83  Pulse: 80 80 87 78  Temp: 98.6 F (37 C) 98.2 F (36.8 C) 99 F (37.2 C) 97.8 F (36.6 C)  TempSrc:  Oral Oral Oral  Resp:  20 19 18   Height:      Weight: 68.1 kg (150 lb 2.1 oz)  68.72 kg (151 lb 8 oz)   SpO2:  95% 98% 98%   Physical Exam no weight today ` General: eating breakfast. Good spirits Heart: RRR no rub Lungs: no wheezes or rales Abdomen: soft NT Extremities: bilateral BKAs no edema Dialysis Access: left upper AVGG patent  Dialysis Orders: Center: [place] on TTS EDW 67 HD Bath 2K 2.5 Ca Time 4 Heparin 6600. Access Left upper AVGG BFR 400 DFR 800 Zemplar 2 mcg IV/per week -Tues Epogen none; Hgb 11.4 4/18 Units IV/HD Venofer 100/week -Tues  Other  Assessment/Plan: 1. Vomiting (with hematemesis) and diarrhea - resolved with medications; Was out of usual GI meds; tight heparin HD 2. ESRD - TTS - HD today off schedule due to missed 4/23; K still low in spite of 4 hours on 4 K bath;; tight heparin. UF only 465; signed off early  Secondary to cramping - ran 2 hours only; HD today to get back on schedule - he is fine with this 3. Hypertension/volume - while much better than outpt HD center with medications; at this point medications are needed; EDW does not need to be lowered 4. Anemia -Hgb running mid 11s to 12 at outpt Hgb noted today to be trending down at 10.7; resume low dose Aranesp' maintenance weekly Iv Fe 5. Metabolic bone disease - last iPTH < 200; zemplar 2 mcg weekly; increase phoslo to 3 ac = outpt dose 6. Nutrition - change to renal carb mod - no spicy foods 7. Chronic pain/phantom limb pain - not at  issue 8. Depression- d/c'd  zoloft - he stopped taking a while ago 9. DM - gastroparesis; BS ok, on QID reglan, usual dose; BS 200 - 250 10. Disposition - ? At discharge, can hospital provide BP, GI and Lantus medications until he gets his month check  Additional Objective Labs: Basic Metabolic Panel:  Lab [id number] 0450 04/18/12 0455 04/17/12 1343  NA 132* 138 135  K 3.4* 3.2* 3.9  CL 91* 93* 90*  CO2 27 26 29   GLUCOSE 295* 96 217*  BUN 30* 42* 38*  CREATININE 9.99* 13.61* 12.37*  CALCIUM 8.3* 9.0 9.3  ALB -- -- --  PHOS -- -- --   Liver Function Tests:  Lab 04/17/12 1343  AST 11  ALT 7  ALKPHOS 105  BILITOT 0.2*  PROT 7.7  ALBUMIN 3.6    Lab 04/17/12 1343  LIPASE 29  AMYLASE --  CBC:  Lab 04/19/12 0450 04/18/12 0455 04/17/12 1343  WBC 6.2 5.8 6.4  NEUTROABS -- -- 3.7  HGB 10.7* 11.1* 12.6*  HCT 32.7* 33.7* 37.8*  MCV 89.3 88.5 88.3  PLT 171 210 199  CBG:  Lab 04/19/12 0804 04/18/12 2133 04/18/12 1759 04/18/12 1123 04/18/12 0739  GLUCAP 253* 248* 192* 199* 95   Medications:      .  amLODipine  10 mg Oral Daily  . aspirin EC  81 mg Oral Daily  . calcium acetate  2,001 mg Oral TID WC  . darbepoetin (ARANESP) injection - DIALYSIS  25 mcg Intravenous Q Thu-HD  . ferric gluconate (FERRLECIT/NULECIT) IV  125 mg Intravenous Q Tue-HD  . insulin aspart  0-15 Units Subcutaneous TID WC  . insulin glargine  15 Units Subcutaneous QHS  . labetalol  200 mg Oral BID  . losartan  100 mg Oral QHS  . metoCLOPramide  10 mg Oral QID  . multivitamin  1 tablet Oral Daily  . ondansetron (ZOFRAN) IV  4 mg Intravenous Once  . ondansetron (ZOFRAN) IV  4 mg Intravenous Q8H  . pantoprazole  40 mg Oral BID  . paricalcitol  2 mcg Intravenous Q Tue-HD  . promethazine  12.5 mg Oral Q6H  . DISCONTD: amLODipine  5 mg Oral Daily  . DISCONTD: amLODipine  5 mg Oral Daily  . DISCONTD: calcium acetate  667 mg Oral TID WC  . DISCONTD: insulin glargine  10 Units Subcutaneous QHS  .  DISCONTD: sertraline  50 mg Oral Daily    I  have reviewed scheduled and prn medications.  Myriam Jacobson, PA-C [facility]  04/19/2012,9:54 AM  LOS: 2 days   Patient seen and examined and agree with assessment and plan as above. GI symptoms resolved, for probable discharge today after HD.  Kelly Splinter  MD [employer] [phone] pgr    [phone] cell 04/19/2012, 1:08 PM

## 2012-04-20 ENCOUNTER — Telehealth: Payer: Self-pay | Admitting: *Deleted

## 2012-04-20 NOTE — Telephone Encounter (Signed)
Approval received and pharmacy notified.

## 2012-04-20 NOTE — ED Provider Notes (Signed)
Medical screening examination/treatment/procedure(s) were performed by non-physician practitioner and as supervising physician I was immediately available for consultation/collaboration.  Leota Jacobsen, MD 04/20/12 [phone]

## 2012-04-20 NOTE — Discharge Summary (Signed)
Seen and examined on  4/25.  Agree with DC as outlined by Dr. Sheral Apley.

## 2012-04-20 NOTE — Telephone Encounter (Signed)
PA required for ondansetron. Dr. Barbra Sarks completed form and it is faxed to insurance company .

## 2012-04-25 LAB — HM HEPATITIS C SCREENING LAB: HM Hepatitis Screen: NEGATIVE

## 2012-05-01 ENCOUNTER — Ambulatory Visit: Payer: Medicare Other | Admitting: Family Medicine

## 2012-05-07 ENCOUNTER — Telehealth: Payer: Self-pay | Admitting: *Deleted

## 2012-05-07 ENCOUNTER — Ambulatory Visit: Payer: Medicare Other | Admitting: Family Medicine

## 2012-05-07 NOTE — Telephone Encounter (Signed)
[facility] calling to let Dr. Sheral Apley know that the 9 prescriptions she sent in for [name] back in April was never picked up.  I have left a message at phone number [phone] for [name] to call me.  Will also forward message to Dr. Sheral Apley.  Lauralyn Primes

## 2012-05-08 NOTE — Telephone Encounter (Signed)
These were at hospital discharge, and patient was given money from indigent fund to pick up prescriptions. Please forward to PCP Dr. Luberta Mutter. Thank you!

## 2012-07-13 ENCOUNTER — Ambulatory Visit: Payer: Medicare Other | Admitting: Family Medicine

## 2012-07-24 ENCOUNTER — Encounter (HOSPITAL_COMMUNITY): Payer: Self-pay | Admitting: *Deleted

## 2012-07-24 ENCOUNTER — Emergency Department (HOSPITAL_COMMUNITY): Payer: Medicare Other

## 2012-07-24 ENCOUNTER — Emergency Department (HOSPITAL_COMMUNITY)
Admission: EM | Admit: 2012-07-24 | Discharge: 2012-07-24 | Disposition: A | Discharge status: Home / Self Care | Payer: Medicare Other | Attending: Emergency Medicine | Admitting: Emergency Medicine

## 2012-07-24 DIAGNOSIS — H538 Other visual disturbances: Secondary | ICD-10-CM | POA: Insufficient documentation

## 2012-07-24 DIAGNOSIS — Z794 Long term (current) use of insulin: Secondary | ICD-10-CM | POA: Insufficient documentation

## 2012-07-24 DIAGNOSIS — H5710 Ocular pain, unspecified eye: Secondary | ICD-10-CM | POA: Insufficient documentation

## 2012-07-24 DIAGNOSIS — N186 End stage renal disease: Secondary | ICD-10-CM | POA: Insufficient documentation

## 2012-07-24 DIAGNOSIS — I120 Hypertensive chronic kidney disease with stage 5 chronic kidney disease or end stage renal disease: Secondary | ICD-10-CM | POA: Insufficient documentation

## 2012-07-24 DIAGNOSIS — Z992 Dependence on renal dialysis: Secondary | ICD-10-CM | POA: Insufficient documentation

## 2012-07-24 DIAGNOSIS — E109 Type 1 diabetes mellitus without complications: Secondary | ICD-10-CM | POA: Insufficient documentation

## 2012-07-24 DIAGNOSIS — H53149 Visual discomfort, unspecified: Secondary | ICD-10-CM | POA: Insufficient documentation

## 2012-07-24 DIAGNOSIS — S88119A Complete traumatic amputation at level between knee and ankle, unspecified lower leg, initial encounter: Secondary | ICD-10-CM | POA: Insufficient documentation

## 2012-07-24 DIAGNOSIS — J329 Chronic sinusitis, unspecified: Secondary | ICD-10-CM | POA: Insufficient documentation

## 2012-07-24 LAB — CBC WITH DIFFERENTIAL/PLATELET
Basophils Absolute: 0.1 10*3/uL (ref 0.0–0.1)
Basophils Relative: 1 % (ref 0–1)
Eosinophils Absolute: 0.2 10*3/uL (ref 0.0–0.7)
Eosinophils Relative: 3 % (ref 0–5)
HCT: 38.5 % — ABNORMAL LOW (ref 39.0–52.0)
Hemoglobin: 12.5 g/dL — ABNORMAL LOW (ref 13.0–17.0)
MCH: 29.8 pg (ref 26.0–34.0)
MCHC: 32.5 g/dL (ref 30.0–36.0)
Monocytes Absolute: 0.6 10*3/uL (ref 0.1–1.0)
Monocytes Relative: 8 % (ref 3–12)
Neutro Abs: 5.4 10*3/uL (ref 1.7–7.7)
RDW: 12.8 % (ref 11.5–15.5)

## 2012-07-24 LAB — POCT I-STAT, CHEM 8
BUN: 11 mg/dL (ref 6–23)
Calcium, Ion: 1.13 mmol/L (ref 1.12–1.23)
Creatinine, Ser: 5.5 mg/dL — ABNORMAL HIGH (ref 0.50–1.35)
Sodium: 139 mEq/L (ref 135–145)
TCO2: 30 mmol/L (ref 0–100)

## 2012-07-24 MED ORDER — OXYCODONE-ACETAMINOPHEN 5-325 MG PO TABS: 1.0000 | ORAL_TABLET | Freq: Four times a day (QID) | ORAL | Status: AC | PRN

## 2012-07-24 MED ORDER — TETRACAINE HCL 0.5 % OP SOLN
1.0000 [drp] | Freq: Once | OPHTHALMIC | Status: AC
  Administered 2012-07-24: 1 [drp] via OPHTHALMIC
  Filled 2012-07-24 (×2): qty 2

## 2012-07-24 MED ORDER — AMOXICILLIN-POT CLAVULANATE 875-125 MG PO TABS: 1.0000 | ORAL_TABLET | Freq: Two times a day (BID) | ORAL | Status: AC

## 2012-07-24 MED ORDER — OXYCODONE-ACETAMINOPHEN 5-325 MG PO TABS
1.0000 | ORAL_TABLET | Freq: Once | ORAL | Status: AC
  Administered 2012-07-24: 1 via ORAL
  Filled 2012-07-24: qty 1

## 2012-07-24 MED ORDER — AMOXICILLIN-POT CLAVULANATE 875-125 MG PO TABS
1.0000 | ORAL_TABLET | Freq: Once | ORAL | Status: AC
  Administered 2012-07-24: 1 via ORAL
  Filled 2012-07-24: qty 1

## 2012-07-24 NOTE — ED Provider Notes (Signed)
History     CSN: [id number]  Arrival date & time 07/24/12  1106   First MD Initiated Contact with Patient 07/24/12 1128      Chief Complaint  Patient presents with  . Eye Pain  . Hypertension    (Consider location/radiation/quality/duration/timing/severity/associated sxs/prior treatment) Patient is a 36 y.o. male presenting with eye pain and hypertension. The history is provided by the patient.  Eye Pain This is a new problem. Episode onset: 5 days ago. The problem occurs constantly. The problem has been gradually worsening. Associated symptoms include headaches. Pertinent negatives include no shortness of breath. Associated symptoms comments: Pain around the right eye socket. Exacerbated by: light. Nothing relieves the symptoms. He has tried nothing for the symptoms. The treatment provided no relief.  Hypertension Associated symptoms include headaches. Pertinent negatives include no shortness of breath.    Past Medical History  Diagnosis Date  . Hypertension   . GERD (gastroesophageal reflux disease)   . Gastropathy   . Chronic pain   . Depression   . Edema   . Headache   . 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 Dialysis patient 04/18/12    "[facility]; [place], [place], [place]"  . Blood transfusion   . Arthritis     "I think I do; just in my fingers & my hands"  . ESRD (end stage renal disease) on dialysis   . Type I diabetes mellitus     "juvenile"  . Gastroparesis     Past Surgical History  Procedure Date  . Below knee leg amputation "it's been awhile"    bilaterally  . Cataract extraction ~ 2011    right  . Av fistula placement 08/2011    left upper arm    Family History  Problem Relation Age of Onset  . Diabetes Other   . Hypertension Other   . Heart disease Other     History  Substance Use Topics  . Smoking status: Never Smoker   . Smokeless tobacco: Never Used  . Alcohol Use: No      Review of Systems  Eyes: Positive for pain.    Respiratory: Negative for shortness of breath.   Neurological: Positive for headaches.  All other systems reviewed and are negative.    Allergies  Review of patient's allergies indicates no known allergies.  Home Medications   Current Outpatient Rx  Name Route Sig Dispense Refill  . AMLODIPINE BESYLATE 5 MG PO TABS Oral Take 1 tablet (5 mg total) by mouth daily. 30 tablet 2  . ASPIRIN 81 MG PO TBEC Oral Take 81 mg by mouth daily.      [name] CALCIUM ACETATE 667 MG PO CAPS Oral Take 1 capsule (667 mg total) by mouth 3 (three) times daily with meals. 90 capsule 0  . GLUCAGON (RDNA) 1 MG IJ KIT  Follow package directions for low blood sugar. 1 each 1  . INSULIN ASPART 100 UNIT/ML [place] SOLN Subcutaneous Inject 2-10 Units into the skin 3 (three) times daily. Sliding scale 5 pen 3  . INSULIN DETEMIR 100 UNIT/ML  SOLN Subcutaneous Inject 20 Units into the skin at bedtime. 10 mL 12    Dispense enough for one month supply.  [name] LABETALOL HCL 200 MG PO TABS Oral Take 1 tablet (200 mg total) by mouth 2 (two) times daily. 60 tablet 2  . LOSARTAN POTASSIUM 100 MG PO TABS Oral Take 1 tablet (100 mg total) by mouth at bedtime. 30 tablet 2  .  METOCLOPRAMIDE HCL 10 MG PO TABS Oral Take 1 tablet (10 mg total) by mouth 4 (four) times daily. 120 tablet 2  . ONDANSETRON HCL 4 MG PO TABS Oral Take 1 tablet (4 mg total) by mouth every 8 (eight) hours as needed. For nausea 20 tablet 2  . PANTOPRAZOLE SODIUM 40 MG PO TBEC Oral Take 1 tablet (40 mg total) by mouth 2 (two) times daily. 60 tablet 2  . PROMETHAZINE HCL 12.5 MG PO TABS Oral Take 12.5 mg by mouth every 6 (six) hours as needed. For nausea    . SERTRALINE HCL 50 MG PO TABS Oral Take 50 mg by mouth daily.      BP 185/120  Pulse 95  Temp 98.5 F (36.9 C) (Oral)  Resp 18  SpO2 99%  Physical Exam  Nursing note and vitals reviewed. Constitutional: He is oriented to person, place, and time. He appears well-developed and well-nourished. No distress.   HENT:  Head: Normocephalic and atraumatic.  Mouth/Throat: Oropharynx is clear and moist.  Eyes: Conjunctivae and EOM are normal. Pupils are equal, round, and reactive to light. Right eye exhibits no discharge. Left eye exhibits no discharge.  Fundoscopic exam:      The right eye shows no papilledema.       The left eye shows no papilledema.       Photophobic.  Reactive pupils.  Mild proptosis of the right eye.  Right IOP is 21.  Neck: Normal range of motion. Neck supple.  Cardiovascular: Normal rate, regular rhythm and intact distal pulses.   No murmur heard. Pulmonary/Chest: Effort normal and breath sounds normal. No respiratory distress. He has no wheezes. He has no rales.  Musculoskeletal: Normal range of motion. He exhibits no edema and no tenderness.       Bilateral bka.  Good thrill over left graft  Neurological: He is alert and oriented to person, place, and time.  Skin: Skin is warm and dry. No rash noted. No erythema.  Psychiatric: He has a normal mood and affect. His behavior is normal.    ED Course  Procedures (including critical care time)  Labs Reviewed  CBC WITH DIFFERENTIAL - Abnormal; Notable for the following:    RBC 4.19 (*)     Hemoglobin 12.5 (*)     HCT 38.5 (*)     Platelets 140 (*)     All other components within normal limits  POCT I-STAT, CHEM 8 - Abnormal; Notable for the following:    Creatinine, Ser 5.50 (*)     Glucose, Bld 250 (*)     All other components within normal limits   Ct Head Wo Contrast  07/24/2012  *RADIOLOGY REPORT*  Clinical Data:  Right eye pain and blurred vision. Photosensitivity.  CT HEAD AND ORBITS WITHOUT CONTRAST  Technique:  Contiguous axial images were obtained from the base of the skull through the vertex without contrast. Multidetector CT imaging of the orbits was performed using the standard protocol without intravenous contrast.  Comparison:  Previous head CT of brain MR examinations.  CT HEAD  Findings: Minimal white  matter low density in both cerebral hemispheres, better seen on the MR dated 02/01/2012.  The ventricles remain normal in size and position.  No intracranial hemorrhage, mass lesion or CT evidence of acute infarction.  Right frontal sinus air-fluid level and bilateral ethmoid sinus mucosal thickening.  IMPRESSION:  1.  Acute right frontal sinusitis and chronic bilateral ethmoid sinusitis. 2.  Stable age advanced minimal chronic  small vessel white matter ischemic changes in both cerebral hemispheres.  CT ORBITS  Findings: The orbital contents have normal appearances bilaterally. Again demonstrated is a right frontal sinus air-fluid level.  There is also mild mucosal thickening in the inferior left frontal sinus as well as mucosal thickening in the ethmoid and maxillary sinuses bilaterally.  The maximum maxillary sinus mucosal thickness is 5.2 mm on the right and 2.3 mm on the left.  IMPRESSION:  1.  Acute right frontal sinusitis and chronic left frontal, bilateral ethmoid and bilateral maxillary sinusitis. 2.  Normal appearing orbits.  Original Report Authenticated By: Gerald Stabs, M.D.   Ct Orbitss W/o Cm  07/24/2012  *RADIOLOGY REPORT*  Clinical Data:  Right eye pain and blurred vision. Photosensitivity.  CT HEAD AND ORBITS WITHOUT CONTRAST  Technique:  Contiguous axial images were obtained from the base of the skull through the vertex without contrast. Multidetector CT imaging of the orbits was performed using the standard protocol without intravenous contrast.  Comparison:  Previous head CT of brain MR examinations.  CT HEAD  Findings: Minimal white matter low density in both cerebral hemispheres, better seen on the MR dated 02/01/2012.  The ventricles remain normal in size and position.  No intracranial hemorrhage, mass lesion or CT evidence of acute infarction.  Right frontal sinus air-fluid level and bilateral ethmoid sinus mucosal thickening.  IMPRESSION:  1.  Acute right frontal sinusitis and chronic  bilateral ethmoid sinusitis. 2.  Stable age advanced minimal chronic small vessel white matter ischemic changes in both cerebral hemispheres.  CT ORBITS  Findings: The orbital contents have normal appearances bilaterally. Again demonstrated is a right frontal sinus air-fluid level.  There is also mild mucosal thickening in the inferior left frontal sinus as well as mucosal thickening in the ethmoid and maxillary sinuses bilaterally.  The maximum maxillary sinus mucosal thickness is 5.2 mm on the right and 2.3 mm on the left.  IMPRESSION:  1.  Acute right frontal sinusitis and chronic left frontal, bilateral ethmoid and bilateral maxillary sinusitis. 2.  Normal appearing orbits.  Original Report Authenticated By: Gerald Stabs, M.D.     No diagnosis found.    MDM   Patient with worsening right eye pain for the last 5 days. He states may decrease in vision however he's not been wearing his glasses today. He does states the pain continues to increase. Patient completed a full course of dialysis this morning and is hypertensive at 185/120 and states he did take one of his blood pressure medications this morning. He denies any infectious symptoms such as fever.  On exam he is photophobic without consensual photophobia. His intraocular pressure is 21 alleviating the concern for glaucoma.  Patient's vision here is 2400 in both eyes. He does not have his glasses with him.   CT of the orbits show acute right frontal sinusitis which is consistent with where patient is having pain. Will treat with Augmentin and after one Percocet he states his pain was controlled.  We'll have him followup with PCP.        Blanchie Dessert, MD 07/24/12 1310

## 2012-07-24 NOTE — ED Notes (Signed)
Pt is a Tuesday,wednesday, Thursday dialysis pt. Reports he had his full treatment this morning.

## 2012-07-24 NOTE — ED Notes (Signed)
Pt is here with right eye pain and blurry vision.  Right pupil looks irregular but reactive, sensitive to light.  Pt reports s/s since last thursday

## 2012-07-24 NOTE — ED Notes (Signed)
Went into Patient room to do a visual acuity screening.  Patient was out of room having CT

## 2012-07-24 NOTE — ED Notes (Signed)
Visual Acuity with no glasses/contacts: Right Eye: 20/400 Left Eye: 20/400 Both Eyes: 20/400

## 2012-08-08 ENCOUNTER — Inpatient Hospital Stay (HOSPITAL_COMMUNITY)
Admission: EM | Admit: 2012-08-08 | Discharge: 2012-08-12 | DRG: 391 | Disposition: A | Discharge status: Home / Self Care | Payer: Medicare Other | Attending: Family Medicine | Admitting: Family Medicine

## 2012-08-08 ENCOUNTER — Emergency Department (HOSPITAL_COMMUNITY): Payer: Medicare Other

## 2012-08-08 ENCOUNTER — Encounter (HOSPITAL_COMMUNITY): Payer: Self-pay

## 2012-08-08 DIAGNOSIS — Z8249 Family history of ischemic heart disease and other diseases of the circulatory system: Secondary | ICD-10-CM

## 2012-08-08 DIAGNOSIS — K3184 Gastroparesis: Secondary | ICD-10-CM | POA: Diagnosis present

## 2012-08-08 DIAGNOSIS — Z833 Family history of diabetes mellitus: Secondary | ICD-10-CM

## 2012-08-08 DIAGNOSIS — S88119A Complete traumatic amputation at level between knee and ankle, unspecified lower leg, initial encounter: Secondary | ICD-10-CM

## 2012-08-08 DIAGNOSIS — Z992 Dependence on renal dialysis: Secondary | ICD-10-CM

## 2012-08-08 DIAGNOSIS — Z7982 Long term (current) use of aspirin: Secondary | ICD-10-CM

## 2012-08-08 DIAGNOSIS — N186 End stage renal disease: Secondary | ICD-10-CM | POA: Diagnosis present

## 2012-08-08 DIAGNOSIS — E1142 Type 2 diabetes mellitus with diabetic polyneuropathy: Secondary | ICD-10-CM | POA: Diagnosis present

## 2012-08-08 DIAGNOSIS — I70209 Unspecified atherosclerosis of native arteries of extremities, unspecified extremity: Secondary | ICD-10-CM | POA: Diagnosis present

## 2012-08-08 DIAGNOSIS — F4310 Post-traumatic stress disorder, unspecified: Secondary | ICD-10-CM | POA: Diagnosis present

## 2012-08-08 DIAGNOSIS — Z79899 Other long term (current) drug therapy: Secondary | ICD-10-CM

## 2012-08-08 DIAGNOSIS — E1049 Type 1 diabetes mellitus with other diabetic neurological complication: Secondary | ICD-10-CM | POA: Diagnosis present

## 2012-08-08 DIAGNOSIS — F329 Major depressive disorder, single episode, unspecified: Secondary | ICD-10-CM | POA: Diagnosis present

## 2012-08-08 DIAGNOSIS — E1065 Type 1 diabetes mellitus with hyperglycemia: Secondary | ICD-10-CM | POA: Diagnosis present

## 2012-08-08 DIAGNOSIS — D631 Anemia in chronic kidney disease: Secondary | ICD-10-CM | POA: Diagnosis present

## 2012-08-08 DIAGNOSIS — IMO0002 Reserved for concepts with insufficient information to code with codable children: Secondary | ICD-10-CM | POA: Diagnosis present

## 2012-08-08 DIAGNOSIS — K219 Gastro-esophageal reflux disease without esophagitis: Secondary | ICD-10-CM | POA: Diagnosis present

## 2012-08-08 DIAGNOSIS — E11319 Type 2 diabetes mellitus with unspecified diabetic retinopathy without macular edema: Secondary | ICD-10-CM | POA: Diagnosis present

## 2012-08-08 DIAGNOSIS — E785 Hyperlipidemia, unspecified: Secondary | ICD-10-CM | POA: Diagnosis present

## 2012-08-08 DIAGNOSIS — I120 Hypertensive chronic kidney disease with stage 5 chronic kidney disease or end stage renal disease: Secondary | ICD-10-CM | POA: Diagnosis present

## 2012-08-08 DIAGNOSIS — F3289 Other specified depressive episodes: Secondary | ICD-10-CM | POA: Diagnosis present

## 2012-08-08 DIAGNOSIS — N2581 Secondary hyperparathyroidism of renal origin: Secondary | ICD-10-CM | POA: Diagnosis present

## 2012-08-08 DIAGNOSIS — R112 Nausea with vomiting, unspecified: Secondary | ICD-10-CM

## 2012-08-08 DIAGNOSIS — E1039 Type 1 diabetes mellitus with other diabetic ophthalmic complication: Secondary | ICD-10-CM | POA: Diagnosis present

## 2012-08-08 DIAGNOSIS — A088 Other specified intestinal infections: Principal | ICD-10-CM | POA: Diagnosis present

## 2012-08-08 DIAGNOSIS — Z794 Long term (current) use of insulin: Secondary | ICD-10-CM

## 2012-08-08 DIAGNOSIS — F1210 Cannabis abuse, uncomplicated: Secondary | ICD-10-CM | POA: Diagnosis present

## 2012-08-08 LAB — CBC
MCH: 30 pg (ref 26.0–34.0)
MCV: 90.3 fL (ref 78.0–100.0)
Platelets: 247 10*3/uL (ref 150–400)
RBC: 3.9 MIL/uL — ABNORMAL LOW (ref 4.22–5.81)

## 2012-08-08 LAB — PROTIME-INR: INR: 0.95 (ref 0.00–1.49)

## 2012-08-08 MED ORDER — MORPHINE SULFATE 4 MG/ML IJ SOLN
4.0000 mg | Freq: Once | INTRAMUSCULAR | Status: AC
  Administered 2012-08-08: 4 mg via INTRAVENOUS
  Filled 2012-08-08: qty 1

## 2012-08-08 MED ORDER — LABETALOL HCL 5 MG/ML IV SOLN
10.0000 mg | Freq: Once | INTRAVENOUS | Status: AC
  Administered 2012-08-08: 10 mg via INTRAVENOUS
  Filled 2012-08-08: qty 4

## 2012-08-08 MED ORDER — ONDANSETRON HCL 4 MG/2ML IJ SOLN
4.0000 mg | Freq: Once | INTRAMUSCULAR | Status: AC
  Administered 2012-08-09: 4 mg via INTRAVENOUS
  Filled 2012-08-08 (×2): qty 2

## 2012-08-08 MED ORDER — PANTOPRAZOLE SODIUM 40 MG IV SOLR
40.0000 mg | Freq: Once | INTRAVENOUS | Status: AC
  Administered 2012-08-08: 40 mg via INTRAVENOUS
  Filled 2012-08-08: qty 40

## 2012-08-08 MED ORDER — ONDANSETRON HCL 4 MG/2ML IJ SOLN
INTRAMUSCULAR | Status: AC
  Administered 2012-08-09: 4 mg via INTRAVENOUS
  Filled 2012-08-08: qty 2

## 2012-08-08 NOTE — ED Provider Notes (Addendum)
History     CSN: [id number]  Arrival date & time 08/08/12  2138   First MD Initiated Contact with Patient 08/08/12 2204      Chief Complaint  Patient presents with  . Emesis    vomiting "coffee ground" emesis since 1400 today; also endorses diffuse abd pain and diarrhea; given Zofran 4 mg IVP per EMS PTA      (Consider location/radiation/quality/duration/timing/severity/associated sxs/prior treatment) Patient is a 36 y.o. male presenting with vomiting. The history is provided by the patient.  Emesis  This is a chronic problem. The current episode started yesterday. The problem occurs 5 to 10 times per day. The problem has not changed since onset.The emesis has an appearance of stomach contents. There has been no fever. Associated symptoms include abdominal pain.    Past Medical History  Diagnosis Date  . Hypertension   . GERD (gastroesophageal reflux disease)   . Gastropathy   . Chronic pain   . Depression   . Edema   . Headache   . 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 Dialysis patient 04/18/12    "[facility]; [place], [place], [place]"  . Blood transfusion   . Arthritis     "I think I do; just in my fingers & my hands"  . ESRD (end stage renal disease) on dialysis   . Type I diabetes mellitus     "juvenile"  . Gastroparesis     Past Surgical History  Procedure Date  . Below knee leg amputation "it's been awhile"    bilaterally  . Cataract extraction ~ 2011    right  . Av fistula placement 08/2011    left upper arm    Family History  Problem Relation Age of Onset  . Diabetes Other   . Hypertension Other   . Heart disease Other     History  Substance Use Topics  . Smoking status: Never Smoker   . Smokeless tobacco: Never Used  . Alcohol Use: No      Review of Systems  Gastrointestinal: Positive for vomiting and abdominal pain.  All other systems reviewed and are negative.    Allergies  Review of patient's allergies indicates no  known allergies.  Home Medications   Current Outpatient Rx  Name Route Sig Dispense Refill  . AMLODIPINE BESYLATE 10 MG PO TABS Oral Take 10 mg by mouth at bedtime.    . ASPIRIN 81 MG PO TBEC Oral Take 81 mg by mouth daily.      [name] CALCIUM ACETATE 667 MG PO CAPS Oral Take 1 capsule (667 mg total) by mouth 3 (three) times daily with meals. 90 capsule 0  . GLUCAGON (RDNA) 1 MG IJ KIT Intravenous Inject 1 mg into the vein once as needed. For low blood sugar    . INSULIN ASPART 100 UNIT/ML [place] SOLN Subcutaneous Inject 2-10 Units into the skin 3 (three) times daily. Sliding scale 5 pen 3  . INSULIN DETEMIR 100 UNIT/ML [place] SOLN Subcutaneous Inject 20 Units into the skin at bedtime. 10 mL 12    Dispense enough for one month supply.  [name] LABETALOL HCL 200 MG PO TABS Oral Take 1 tablet (200 mg total) by mouth 2 (two) times daily. 60 tablet 2  . LOSARTAN POTASSIUM 100 MG PO TABS Oral Take 1 tablet (100 mg total) by mouth at bedtime. 30 tablet 2  . METOCLOPRAMIDE HCL 10 MG PO TABS Oral Take 1 tablet (10 mg total) by mouth 4 (four)  times daily. 120 tablet 2  . ONDANSETRON HCL 4 MG PO TABS Oral Take 1 tablet (4 mg total) by mouth every 8 (eight) hours as needed. For nausea 20 tablet 2  . PANTOPRAZOLE SODIUM 40 MG PO TBEC Oral Take 1 tablet (40 mg total) by mouth 2 (two) times daily. 60 tablet 2  . PROMETHAZINE HCL 12.5 MG PO TABS Oral Take 12.5 mg by mouth every 6 (six) hours as needed. For nausea    . SERTRALINE HCL 50 MG PO TABS Oral Take 50 mg by mouth daily.      BP 203/104  Temp 98.9 F (37.2 C) (Oral)  Resp 18  SpO2 100%  Physical Exam  Constitutional: He is oriented to person, place, and time. He appears well-developed and well-nourished.  HENT:  Head: Normocephalic and atraumatic.  Eyes: Conjunctivae are normal. Pupils are equal, round, and reactive to light.  Neck: Normal range of motion. Neck supple.  Cardiovascular: Normal rate, regular rhythm, normal heart sounds and intact distal pulses.    Pulmonary/Chest: Effort normal and breath sounds normal.  Abdominal: Soft. Bowel sounds are normal. There is tenderness in the epigastric area.  Musculoskeletal:       bilat bka  Left av fistula with thrill  Neurological: He is alert and oriented to person, place, and time.  Skin: Skin is warm and dry.  Psychiatric: He has a normal mood and affect. His behavior is normal. Judgment and thought content normal.    ED Course  Procedures (including critical care time)   Labs Reviewed  CBC  COMPREHENSIVE METABOLIC PANEL  PROTIME-INR   No results found.   No diagnosis found.   Date: 08/08/2012  Rate: 86  Rhythm: normal sinus rhythm  QRS Axis: left  Intervals: normal  ST/T Wave abnormalities: normal and nonspecific T wave changes  Conduction Disutrbances:none  Narrative Interpretation:   Old EKG Reviewed: unchanged   MDM  PT with hx of ERSD on dialysis,  Gastroparesis.   Vomiting since yesterday.  Unable to hold down meds. Hypertensive.  Await labs,  Will medicate, reassess  Pt with persistant nausea,  Vomiting,  Mild pancreatitis.  Discussed with family medicine,  Will admit      Aaron Edelman, MD 08/08/12 [place] Tiara Maultsby, MD 08/08/12 [place], MD 08/08/12 [id number]  Anay Walter Ferne Reus, MD 08/09/12 [id number]

## 2012-08-08 NOTE — ED Notes (Addendum)
Pt reports coffee ground emesis x 2 days. Diarrhea x 3 days. Reports dry heaves and sharp abdominal pain in all four quadrants. 10/10 Pt goes to dialysis on Tues/Thurs/Sat. Access site in left forearm. Thrill felt on palpation. Lungs clear. Respirations even and regular.  Pt is double limb amputee, below the knee bilaterally.  Skin in tact. Warm and dry. A.O. X 4. Laying down in bed. NAD.

## 2012-08-09 ENCOUNTER — Encounter (HOSPITAL_COMMUNITY): Payer: Self-pay | Admitting: *Deleted

## 2012-08-09 DIAGNOSIS — R112 Nausea with vomiting, unspecified: Secondary | ICD-10-CM

## 2012-08-09 DIAGNOSIS — R109 Unspecified abdominal pain: Secondary | ICD-10-CM

## 2012-08-09 LAB — GLUCOSE, CAPILLARY
Glucose-Capillary: 288 mg/dL — ABNORMAL HIGH (ref 70–99)
Glucose-Capillary: 126 mg/dL — ABNORMAL HIGH (ref 70–99)
Glucose-Capillary: 82 mg/dL (ref 70–99)

## 2012-08-09 LAB — COMPREHENSIVE METABOLIC PANEL
AST: 12 U/L (ref 0–37)
CO2: 27 mEq/L (ref 19–32)
Calcium: 9.3 mg/dL (ref 8.4–10.5)
Creatinine, Ser: 11.04 mg/dL — ABNORMAL HIGH (ref 0.50–1.35)
GFR calc Af Amer: 6 mL/min — ABNORMAL LOW (ref >90)
GFR calc non Af Amer: 5 mL/min — ABNORMAL LOW (ref >90)

## 2012-08-09 LAB — CBC
MCH: 29.5 pg (ref 26.0–34.0)
MCHC: 33.1 g/dL (ref 30.0–36.0)
MCV: 89 fL (ref 78.0–100.0)
Platelets: 227 10*3/uL (ref 150–400)

## 2012-08-09 LAB — BASIC METABOLIC PANEL
CO2: 25 mEq/L (ref 19–32)
Calcium: 8.6 mg/dL (ref 8.4–10.5)
Creatinine, Ser: 11.82 mg/dL — ABNORMAL HIGH (ref 0.50–1.35)
GFR calc non Af Amer: 5 mL/min — ABNORMAL LOW (ref >90)
Glucose, Bld: 220 mg/dL — ABNORMAL HIGH (ref 70–99)
Sodium: 136 mEq/L (ref 135–145)

## 2012-08-09 LAB — RAPID URINE DRUG SCREEN, HOSP PERFORMED
Amphetamines: NOT DETECTED
Barbiturates: NOT DETECTED
Tetrahydrocannabinol: POSITIVE — AB

## 2012-08-09 LAB — LIPASE, BLOOD: Lipase: 64 U/L — ABNORMAL HIGH (ref 11–59)

## 2012-08-09 MED ORDER — INSULIN ASPART 100 UNIT/ML ~~LOC~~ SOLN
0.0000 [IU] | Freq: Three times a day (TID) | SUBCUTANEOUS | Status: DC
  Administered 2012-08-09: 8 [IU] via SUBCUTANEOUS
  Administered 2012-08-09: 3 [IU] via SUBCUTANEOUS
  Administered 2012-08-10 – 2012-08-11 (×2): 5 [IU] via SUBCUTANEOUS

## 2012-08-09 MED ORDER — ONDANSETRON HCL 4 MG/2ML IJ SOLN: 4.0000 mg | Freq: Four times a day (QID) | INTRAMUSCULAR | Status: DC | PRN

## 2012-08-09 MED ORDER — INSULIN ASPART 100 UNIT/ML ~~LOC~~ SOLN: 0.0000 [IU] | Freq: Every day | SUBCUTANEOUS | Status: DC

## 2012-08-09 MED ORDER — SERTRALINE HCL 50 MG PO TABS
50.0000 mg | ORAL_TABLET | Freq: Every day | ORAL | Status: DC
  Administered 2012-08-09 – 2012-08-12 (×4): 50 mg via ORAL
  Filled 2012-08-09 (×4): qty 1

## 2012-08-09 MED ORDER — HYDRALAZINE HCL 20 MG/ML IJ SOLN
10.0000 mg | Freq: Once | INTRAMUSCULAR | Status: DC
  Filled 2012-08-09: qty 0.5

## 2012-08-09 MED ORDER — PROMETHAZINE HCL 25 MG/ML IJ SOLN
12.5000 mg | Freq: Four times a day (QID) | INTRAMUSCULAR | Status: DC | PRN
  Administered 2012-08-09: 12.5 mg via INTRAVENOUS
  Filled 2012-08-09: qty 1

## 2012-08-09 MED ORDER — MORPHINE SULFATE 2 MG/ML IJ SOLN
1.0000 mg | INTRAMUSCULAR | Status: DC | PRN
  Administered 2012-08-09 – 2012-08-11 (×7): 1 mg via INTRAVENOUS
  Filled 2012-08-09 (×7): qty 1

## 2012-08-09 MED ORDER — PANTOPRAZOLE SODIUM 40 MG IV SOLR
40.0000 mg | INTRAVENOUS | Status: DC
  Administered 2012-08-09 – 2012-08-12 (×3): 40 mg via INTRAVENOUS
  Filled 2012-08-09 (×5): qty 40

## 2012-08-09 MED ORDER — CALCIUM ACETATE 667 MG PO CAPS
667.0000 mg | ORAL_CAPSULE | Freq: Three times a day (TID) | ORAL | Status: DC
  Administered 2012-08-09 – 2012-08-12 (×6): 667 mg via ORAL
  Filled 2012-08-09 (×14): qty 1

## 2012-08-09 MED ORDER — ACETAMINOPHEN 650 MG RE SUPP: 650.0000 mg | Freq: Four times a day (QID) | RECTAL | Status: DC | PRN

## 2012-08-09 MED ORDER — LABETALOL HCL 200 MG PO TABS: 200.0000 mg | ORAL_TABLET | Freq: Two times a day (BID) | ORAL | Status: DC

## 2012-08-09 MED ORDER — HYDRALAZINE HCL 20 MG/ML IJ SOLN
10.0000 mg | Freq: Four times a day (QID) | INTRAMUSCULAR | Status: DC | PRN
  Administered 2012-08-09 – 2012-08-10 (×3): 10 mg via INTRAVENOUS
  Filled 2012-08-09 (×3): qty 0.5

## 2012-08-09 MED ORDER — AMLODIPINE BESYLATE 10 MG PO TABS
10.0000 mg | ORAL_TABLET | Freq: Every day | ORAL | Status: DC
  Administered 2012-08-09 – 2012-08-11 (×3): 10 mg via ORAL
  Filled 2012-08-09 (×4): qty 1

## 2012-08-09 MED ORDER — ONDANSETRON HCL 4 MG PO TABS: 4.0000 mg | ORAL_TABLET | Freq: Four times a day (QID) | ORAL | Status: DC | PRN

## 2012-08-09 MED ORDER — DEXTROSE-NACL 5-0.9 % IV SOLN
INTRAVENOUS | Status: DC
  Administered 2012-08-09: 06:00:00 via INTRAVENOUS

## 2012-08-09 MED ORDER — PANTOPRAZOLE SODIUM 40 MG PO TBEC
40.0000 mg | DELAYED_RELEASE_TABLET | Freq: Two times a day (BID) | ORAL | Status: DC
  Filled 2012-08-09: qty 1

## 2012-08-09 MED ORDER — ACETAMINOPHEN 325 MG PO TABS: 650.0000 mg | ORAL_TABLET | Freq: Four times a day (QID) | ORAL | Status: DC | PRN

## 2012-08-09 MED ORDER — LOSARTAN POTASSIUM 50 MG PO TABS
100.0000 mg | ORAL_TABLET | Freq: Every day | ORAL | Status: DC
  Administered 2012-08-09 – 2012-08-11 (×3): 100 mg via ORAL
  Filled 2012-08-09 (×4): qty 2

## 2012-08-09 MED ORDER — PROMETHAZINE HCL 12.5 MG PO TABS
12.5000 mg | ORAL_TABLET | Freq: Four times a day (QID) | ORAL | Status: DC | PRN
  Filled 2012-08-09: qty 1

## 2012-08-09 MED ORDER — INSULIN GLARGINE 100 UNIT/ML ~~LOC~~ SOLN
20.0000 [IU] | Freq: Every day | SUBCUTANEOUS | Status: DC
  Administered 2012-08-09: 20 [IU] via SUBCUTANEOUS
  Filled 2012-08-09: qty 1

## 2012-08-09 MED ORDER — ASPIRIN EC 81 MG PO TBEC
81.0000 mg | DELAYED_RELEASE_TABLET | Freq: Every day | ORAL | Status: DC
  Administered 2012-08-09 – 2012-08-12 (×4): 81 mg via ORAL
  Filled 2012-08-09 (×4): qty 1

## 2012-08-09 MED ORDER — PARICALCITOL 5 MCG/ML IV SOLN
2.0000 ug | INTRAVENOUS | Status: DC
  Administered 2012-08-11: 2 ug via INTRAVENOUS
  Filled 2012-08-09 (×2): qty 0.4

## 2012-08-09 MED ORDER — METOCLOPRAMIDE HCL 10 MG PO TABS
20.0000 mg | ORAL_TABLET | Freq: Three times a day (TID) | ORAL | Status: DC
  Administered 2012-08-09 – 2012-08-11 (×9): 20 mg via ORAL
  Filled 2012-08-09 (×17): qty 2

## 2012-08-09 MED ORDER — SODIUM CHLORIDE 0.45 % IV SOLN: INTRAVENOUS | Status: DC

## 2012-08-09 MED ORDER — HEPARIN SODIUM (PORCINE) 5000 UNIT/ML IJ SOLN
5000.0000 [IU] | Freq: Three times a day (TID) | INTRAMUSCULAR | Status: DC
  Administered 2012-08-09: 5000 [IU] via SUBCUTANEOUS
  Filled 2012-08-09 (×13): qty 1

## 2012-08-09 MED ORDER — DARBEPOETIN ALFA-POLYSORBATE 100 MCG/0.5ML IJ SOLN: 100.0000 ug | INTRAMUSCULAR | Status: DC

## 2012-08-09 NOTE — Consult Note (Signed)
[facility] Renal Consultation Note  Indication for Consultation:  Management of ESRD/hemodialysis; anemia, hypertension/volume and secondary hyperparathyroidism  HPI: [name] is a 36 y.o. male with ESRD on HD in [place] who was admitted yesterday after two days of watery diarrhea, nausea, and vomiting with coffee-ground emesis.  He has a history of nausea and vomiting, related to diabetic gastroparesis, and was last hospitalized in April with infectious gastroenteritis vs.gastroparesis.  He has received IV Zofran and Phenergan and has had no nausea or vomiting today.   Dialysis Orders: Center: [facility] on TTS. EDW 65.5 kg  HD Bath 2K/2.5Ca   Time 4 hrs   Heparin 3000 U. Access AVG @ LUA  BFR 450 DFR 800    Zemplar 2 mcg IV/HD   Epogen 22,000 Units IV/HD  Venofer  0  Past Medical History  Diagnosis Date  . Hypertension   . GERD (gastroesophageal reflux disease)   . Gastropathy   . Chronic pain   . Depression   . Edema   . Headache   . 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 Dialysis patient 04/18/12    "[facility]; [place], [place], [place]"  . Blood transfusion   . Arthritis     "I think I do; just in my fingers & my hands"  . ESRD (end stage renal disease) on dialysis   . Type I diabetes mellitus     "juvenile"  . Gastroparesis    Past Surgical History  Procedure Date  . Below knee leg amputation "it's been awhile"    bilaterally  . Cataract extraction ~ 2011    right  . Av fistula placement 08/2011    left upper arm   Family History  Problem Relation Age of Onset  . Diabetes Other   . Hypertension Other   . Heart disease Other    Social History  He reports that he frequently uses marijuana, but has never used tobacco, alcohol, or other illicit drugs. He has never used smokeless tobacco.   No Known Allergies Prior to Admission medications   Medication Sig Start Date End Date Taking? Authorizing Provider  amLODipine (NORVASC) 10  MG tablet Take 10 mg by mouth at bedtime.   Yes Historical Provider, MD  aspirin 81 MG EC tablet Take 81 mg by mouth daily.     Yes Historical Provider, MD  calcium acetate (PHOSLO) 667 MG capsule Take 1 capsule (667 mg total) by mouth 3 (three) times daily with meals. 04/19/12  Yes Amber Fidel Levy, MD  glucagon 1 MG injection Inject 1 mg into the vein once as needed. For low blood sugar   Yes Historical Provider, MD  insulin aspart (NOVOLOG FLEXPEN) 100 UNIT/ML injection Inject 2-10 Units into the skin 3 (three) times daily. Sliding scale 04/19/12  Yes Amber Fidel Levy, MD  insulin detemir (LEVEMIR) 100 UNIT/ML injection Inject 20 Units into the skin at bedtime. 04/19/12 04/19/13 Yes Cletus Gash, MD  labetalol (NORMODYNE) 200 MG tablet Take 1 tablet (200 mg total) by mouth 2 (two) times daily. 04/19/12  Yes Amber Fidel Levy, MD  losartan (COZAAR) 100 MG tablet Take 1 tablet (100 mg total) by mouth at bedtime. 04/19/12  Yes Amber Fidel Levy, MD  metoCLOPramide (REGLAN) 10 MG tablet Take 1 tablet (10 mg total) by mouth 4 (four) times daily. 04/19/12  Yes Amber Fidel Levy, MD  ondansetron (ZOFRAN) 4 MG tablet Take 1 tablet (4 mg total) by mouth every 8 (eight) hours as  needed. For nausea 04/19/12  Yes Amber Fidel Levy, MD  pantoprazole (PROTONIX) 40 MG tablet Take 1 tablet (40 mg total) by mouth 2 (two) times daily. 04/19/12  Yes Amber Fidel Levy, MD  promethazine (PHENERGAN) 12.5 MG tablet Take 12.5 mg by mouth every 6 (six) hours as needed. For nausea   Yes Historical Provider, MD  sertraline (ZOLOFT) 50 MG tablet Take 50 mg by mouth daily.   Yes Historical Provider, MD   Labs:  Results for orders placed during the hospital encounter of 08/08/12 (from the past 48 hour(s))  CBC     Status: Abnormal   Collection Time   08/08/12 11:16 PM      Component Value Range Comment   WBC 5.8  4.0 - 10.5 K/uL    RBC 3.90 (*) 4.22 - 5.81 MIL/uL    Hemoglobin 11.7 (*) 13.0 - 17.0 g/dL    HCT 35.2 (*) 39.0 -  52.0 %    MCV 90.3  78.0 - 100.0 fL    MCH 30.0  26.0 - 34.0 pg    MCHC 33.2  30.0 - 36.0 g/dL    RDW 13.0  11.5 - 15.5 %    Platelets 247  150 - 400 K/uL   COMPREHENSIVE METABOLIC PANEL     Status: Abnormal   Collection Time   08/08/12 11:16 PM      Component Value Range Comment   Sodium 133 (*) 135 - 145 mEq/L    Potassium 3.8  3.5 - 5.1 mEq/L    Chloride 90 (*) 96 - 112 mEq/L    CO2 27  19 - 32 mEq/L    Glucose, Bld 353 (*) 70 - 99 mg/dL    BUN 36 (*) 6 - 23 mg/dL    Creatinine, Ser 11.04 (*) 0.50 - 1.35 mg/dL    Calcium 9.3  8.4 - 10.5 mg/dL    Total Protein 8.0  6.0 - 8.3 g/dL    Albumin 3.6  3.5 - 5.2 g/dL    AST 12  0 - 37 U/L    ALT 7  0 - 53 U/L    Alkaline Phosphatase 105  39 - 117 U/L    Total Bilirubin 0.2 (*) 0.3 - 1.2 mg/dL    GFR calc non Af Amer 5 (*) >90 mL/min    GFR calc Af Amer 6 (*) >90 mL/min   PROTIME-INR     Status: Normal   Collection Time   08/08/12 11:16 PM      Component Value Range Comment   Prothrombin Time 12.9  11.6 - 15.2 seconds    INR 0.95  0.00 - 1.49   LIPASE, BLOOD     Status: Abnormal   Collection Time   08/08/12 11:16 PM      Component Value Range Comment   Lipase 64 (*) 11 - 59 U/L   MRSA PCR SCREENING     Status: Normal   Collection Time   08/09/12  5:40 AM      Component Value Range Comment   MRSA by PCR NEGATIVE  NEGATIVE   URINE RAPID DRUG SCREEN (HOSP PERFORMED)     Status: Abnormal   Collection Time   08/09/12  6:42 AM      Component Value Range Comment   Opiates NONE DETECTED  NONE DETECTED    Cocaine NONE DETECTED  NONE DETECTED    Benzodiazepines NONE DETECTED  NONE DETECTED    Amphetamines NONE DETECTED  NONE DETECTED  Tetrahydrocannabinol POSITIVE (*) NONE DETECTED    Barbiturates NONE DETECTED  NONE DETECTED   GLUCOSE, CAPILLARY     Status: Abnormal   Collection Time   08/09/12  6:55 AM      Component Value Range Comment   Glucose-Capillary 288 (*) 70 - 99 mg/dL    Comment 1 Documented in Chart      Comment 2  Notify RN     CBC     Status: Abnormal   Collection Time   08/09/12 10:30 AM      Component Value Range Comment   WBC 5.3  4.0 - 10.5 K/uL    RBC 3.36 (*) 4.22 - 5.81 MIL/uL    Hemoglobin 9.9 (*) 13.0 - 17.0 g/dL    HCT 29.9 (*) 39.0 - 52.0 %    MCV 89.0  78.0 - 100.0 fL    MCH 29.5  26.0 - 34.0 pg    MCHC 33.1  30.0 - 36.0 g/dL    RDW 12.8  11.5 - 15.5 %    Platelets 227  150 - 400 K/uL   BASIC METABOLIC PANEL     Status: Abnormal   Collection Time   08/09/12 10:30 AM      Component Value Range Comment   Sodium 136  135 - 145 mEq/L    Potassium 3.3 (*) 3.5 - 5.1 mEq/L    Chloride 95 (*) 96 - 112 mEq/L    CO2 25  19 - 32 mEq/L    Glucose, Bld 220 (*) 70 - 99 mg/dL    BUN 37 (*) 6 - 23 mg/dL    Creatinine, Ser 11.82 (*) 0.50 - 1.35 mg/dL    Calcium 8.6  8.4 - 10.5 mg/dL    GFR calc non Af Amer 5 (*) >90 mL/min    GFR calc Af Amer 6 (*) >90 mL/min   GLUCOSE, CAPILLARY     Status: Abnormal   Collection Time   08/09/12 11:52 AM      Component Value Range Comment   Glucose-Capillary 126 (*) 70 - 99 mg/dL    Comment 1 Notify RN      Comment 2 Documented in Chart      Constitutional: negative for chills, fatigue, fevers and sweats Ears, nose, mouth, throat, and face: negative for hearing loss, hoarseness, nasal congestion and sore throat Respiratory: negative for cough, dyspnea on exertion, hemoptysis and wheezing Cardiovascular: negative for chest pain, chest pressure/discomfort, orthopnea and palpitations Gastrointestinal: positive for abdominal pain, diarrhea, nausea and vomiting Genitourinary:negative, oliguric Musculoskeletal:negative for arthralgias, back pain, muscle weakness and neck pain Neurological: negative for dizziness, headaches and speech problems  Physical Exam: Filed Vitals:   08/09/12 1146  BP: 190/95  Pulse:   Temp:   Resp:      General appearance: alert, cooperative and no distress Head: Normocephalic, without obvious abnormality, atraumatic Neck: no  adenopathy, no carotid bruit, no JVD and supple, symmetrical, trachea midline Resp: clear to auscultation bilaterally Cardio: regular rate and rhythm with Gr II/VI systolic murmur GI: soft, non-tender; bowel sounds normal; no masses,  no organomegaly Extremities: bilateral BKA, no edema Neurologic: Grossly normal Dialysis Access: AVG @ LUA with + bruit   Assessment/Plan: 1. Nausea/vomitng/diarrhea - likely secondary to diabetic gastroparesis and/or gastroenteritis; symptoms better on Zofran and Phenergan, on outpatient Protonix with Zofran and Phenergan PRN.. 2. ESRD -  HD on TTS in [place]; K 3.3 this AM.  HD pending today. 3. Hypertension/volume  - BP 190/95 most recently, on  outpatient Amlodipine 10 mg qd, Losartan 100 mg, & Labetalol 200 mg bid; wt 65 kg today with EDW 65.5 kg.  No fluid removal today. 4. Anemia  - Hgb down from 11.7 yesterday to 9.9 today, last T-sat 34%, on outpatient Epogen 22,000 U.  Aranesp 100 mcg today. 5. Metabolic bone disease -  Ca 8.6, last P 8.7 on 7/25, on Phoslo 3 with meals, Zemplar 2 mcg. 6. Nutrition - last Alb 3.8 on 7/25. 7. DM - on Lantus and SSI.   8. PVD - s/p B BKA.  LYLES,CHARLES 08/09/2012, 12:29 PM   Attending Nephrologist: Roney Jaffe, MD  Patient seen and examined and agree with assessment and plan as above.  Kelly Splinter  MD [employer] [phone] pgr    [phone] cell 08/09/2012, 5:11 PM

## 2012-08-09 NOTE — Progress Notes (Signed)
Pt continues to c/o nausea. Pt is not able to have scheduled zofran as of now because he recently received in ED. Sts he doesn't think he could keep phenergan tablet down. MD notified. Phenergan changed to IV. Patient notified. Awaiting pharmacy to verify. Will cont to monitor.

## 2012-08-09 NOTE — Progress Notes (Signed)
Echocardiogram 2D Echocardiogram has been performed.  Krisanne Lich 08/09/2012, 3:39 PM

## 2012-08-09 NOTE — H&P (Signed)
[facility] Admission History and Physical Service Pager: [phone]  Patient name: [name] Medical record number: [id number] Date of birth: 1976-09-03 Age: 36 y.o. Gender: male  Primary Care Provider: [name], MD  Chief Complaint: nausea and vomiting  Assessment and Plan: [name] is a 36 y.o. year old male with a history of ESRD and diabetic gastroparesis presenting with two days of nausea, vomiting, and diarrhea. Pt is well known to the family medicine service and is presenting in his usual fashion 1. Nausea & vomiting - most likely due to diabetic gastroparesis, but may have some component of viral gastroenteritis as pt has had some diarrhea as well, and was recently at a large family reuinion. Questionable coffee ground emesis so gastric ulcer is also on differential, though Hgb at baseline. Will hydrate with IVF, give zofran and phenergan prn nausea. Check gastroccult cards as pt reports coffee ground emesis. Treat pain with IV morphine 1mg  q2h prn. Will transition to PO pain meds as soon as possible 2. Diabetes - AG 16. Difficult to assess true gap as pt on dialysis, w/ no apparent s/s of distress/acidosis. Pt has been halfing his insulin since becoming ill.  Will hydrate with D5 NS and resume home insulin dose. Does not appear to be in distress, so DKA seems less likely.  3. ESRD - Cr. Noted. Last dialysis on 8/13. Will consult renal for HD while he's here. Will need dialysis on 8/15. 4. FEN/GI: D5NS @ 100. Clear liquids for now, advance diet as tolerated to renal. 5. Prophylaxis: heparin, protonix 6. Disposition: pending improvement 7. Code Status: full code  History of Present Illness: [name] is a 36 y.o. year old male presenting with nausea and vomiting and diarrhea x1.  Vomiting coffee grounds, 5-10 times per day for 2 days. Unable to keep anything down. Sleeping makes the nausea/vomiting better. No other sick  contacts. Did not eat spoiled food. Has also had diarrhea a few times per day for around two days. Diarrhea is very watery. Has been taking insulin. Checks sugars at home, they have been around 200. Has been giving himself half of his normal insulin dose. Last had dialysis yesterday. Has had some abdominal pain with the vomiting.  ROS: + for Lightheaded, dizziness, some mild shortness of breath, jitteriness. No fevers. Does have a new mild rash on left elbow. Not been in woods. No insect bites that he knows of. No new soaps or lotions.   Patient Active Problem List  Diagnosis  . DIABETES MELLITUS, TYPE I, UNCONTROLLED, WITH COMPLICATIONS  . HYPERLIPIDEMIA  . DEPRESSION, MAJOR, RECURRENT  . POST TRAUMATIC STRESS DISORDER  . HYPERTENSION, BENIGN SYSTEMIC  . GERD  . ESRD (end stage renal disease)  . IMPOTENCE, ORGANIC  . Constipation  . Amputation stump pain  . Nausea & vomiting  . Metabolic bone disease  . Gastroenteritis   Past Medical History: Past Medical History  Diagnosis Date  . Hypertension   . GERD (gastroesophageal reflux disease)   . Gastropathy   . Chronic pain   . Depression   . Edema   . Headache   . 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 Dialysis patient 04/18/12    "[facility]; [place], [place], [place]"  . Blood transfusion   . Arthritis     "I think I do; just in my fingers & my hands"  . ESRD (end stage renal disease) on dialysis   . Type I diabetes  mellitus     "juvenile"  . Gastroparesis    Past Surgical History: Past Surgical History  Procedure Date  . Below knee leg amputation "it's been awhile"    bilaterally  . Cataract extraction ~ 2011    right  . Av fistula placement 08/2011    left upper arm   Social History: History  Substance Use Topics  . Smoking status: Never Smoker   . Smokeless tobacco: Never Used  . Alcohol Use: No   For any additional social history documentation, please refer to relevant sections of EMR.  Family  History: Family History  Problem Relation Age of Onset  . Diabetes Other   . Hypertension Other   . Heart disease Other    Allergies: No Known Allergies No current facility-administered medications on file prior to encounter.   Current Outpatient Prescriptions on File Prior to Encounter  Medication Sig Dispense Refill  . aspirin 81 MG EC tablet Take 81 mg by mouth daily.        . calcium acetate (PHOSLO) 667 MG capsule Take 1 capsule (667 mg total) by mouth 3 (three) times daily with meals.  90 capsule  0  . insulin aspart (NOVOLOG FLEXPEN) 100 UNIT/ML injection Inject 2-10 Units into the skin 3 (three) times daily. Sliding scale  5 pen  3  . insulin detemir (LEVEMIR) 100 UNIT/ML injection Inject 20 Units into the skin at bedtime.  10 mL  12  . labetalol (NORMODYNE) 200 MG tablet Take 1 tablet (200 mg total) by mouth 2 (two) times daily.  60 tablet  2  . losartan (COZAAR) 100 MG tablet Take 1 tablet (100 mg total) by mouth at bedtime.  30 tablet  2  . metoCLOPramide (REGLAN) 10 MG tablet Take 1 tablet (10 mg total) by mouth 4 (four) times daily.  120 tablet  2  . ondansetron (ZOFRAN) 4 MG tablet Take 1 tablet (4 mg total) by mouth every 8 (eight) hours as needed. For nausea  20 tablet  2  . pantoprazole (PROTONIX) 40 MG tablet Take 1 tablet (40 mg total) by mouth 2 (two) times daily.  60 tablet  2  . promethazine (PHENERGAN) 12.5 MG tablet Take 12.5 mg by mouth every 6 (six) hours as needed. For nausea      . sertraline (ZOLOFT) 50 MG tablet Take 50 mg by mouth daily.       Review Of Systems: Per HPI with the following additions:  Otherwise 12 point review of systems was performed and was unremarkable.  Physical Exam: BP 176/96  Temp 98.9 F (37.2 C) (Oral)  Resp 18  SpO2 98% Exam: General: lying in bed, appears uncomfortable. HEENT: moist mucous membranes, no cervical lymphadenopathy Cardiovascular: 3/6 systolic murmur at the left upper sternal border Respiratory: CTAB, normal  respiratory effort Abdomen: bowel sounds minimal, but present. nontender to palpation. Extremities: bilateral normal in appearance. Skin: several small papules on left elbow. Neuro: no focal deficits, speech intact, A&Ox3.  Labs and Imaging:  Comprehensive Metabolic Panel:    Component Value Date/Time   NA 133* 08/08/2012 2316   K 3.8 08/08/2012 2316   CL 90* 08/08/2012 2316   CO2 27 08/08/2012 2316   BUN 36* 08/08/2012 2316   CREATININE 11.04* 08/08/2012 2316   GLUCOSE 353* 08/08/2012 2316   CALCIUM 9.3 08/08/2012 2316   CALCIUM 8.4 09/08/2011 0746   AST 12 08/08/2012 2316   ALT 7 08/08/2012 2316   ALKPHOS 105 08/08/2012 2316   BILITOT  0.2* 08/08/2012 2316   PROT 8.0 08/08/2012 2316   ALBUMIN 3.6 08/08/2012 2316    CBC:    Component Value Date/Time   WBC 5.8 08/08/2012 2316   HGB 11.7* 08/08/2012 2316   HCT 35.2* 08/08/2012 2316   PLT 247 08/08/2012 2316   MCV 90.3 08/08/2012 2316   NEUTROABS 5.4 07/24/2012 1134   LYMPHSABS 1.0 07/24/2012 1134   MONOABS 0.6 07/24/2012 1134   EOSABS 0.2 07/24/2012 1134   BASOSABS 0.1 07/24/2012 1134   CXR - no acute disease  Lipase 64.   Chrisandra Netters, MD 08/09/2012, 3:08 AM                                                                                                                                                                                   Upper Level Addendum  Agree w/ Intern note w/ additions marked in bold  Physical exam  Gen: Mild distress, thin HEENT: No cervical lymphadeonpathy, oropharynx clear, EOMI Res: CTAB, normal effort CV: RRR, III/VI systolic murmur Abd: NABS, non-painful to palpation when distracted Ext/Musc: Bilateral BKA,  Neuro: CNg rossly intact,

## 2012-08-09 NOTE — H&P (Signed)
FMTS Attending Admission Note: Dorcas Mcmurray MD [phone] pager office [phone] I  have seen and examined this patient, reviewed their chart. I have discussed this patient with the resident. I agree with the resident's findings, assessment and care plan. Abdomen is non tender to exam.

## 2012-08-09 NOTE — Progress Notes (Signed)
Pt arrived from ED. Pt in no s/sx of distress. Oriented to room, Safety plan signed. BP on arrival is 196/99. MD notified. Awaiting orders. Will cont to monitor.

## 2012-08-09 NOTE — Progress Notes (Signed)
PCP Note.  This is my first time to meet Mr. Schuelke, a very nice man with complicated history of ESRD, bilateral LE amputations and recurrent gastroparesis. He seems to be turning the corner today, as he has tolerated a solid lunch. He is frustrated about recurrence of symptoms, uses reglan, protonix and zofran at home as needed. He states he is slightly below his dry weight now, likely dehydration related to diarrhea and emesis. Appreciate excellent care from both primary team and Nephrology.   Patient states he recently went to ED for sinus infection, I provided him with phone number for [facility] clinic and strongly encouraged he follow up for minor illnesses with [place], as well as hospital f/u.   [name], MD [facility] Resident - PGY-3 08/09/2012 3:08 PM

## 2012-08-09 NOTE — Progress Notes (Signed)
Nurse was informed that patient will be going for hemodialysis today. Talked with Dialysis nurse and was told that patient having orders for tomorrow to have the dialysis. Patient aware of schedule change.

## 2012-08-10 ENCOUNTER — Inpatient Hospital Stay (HOSPITAL_COMMUNITY): Payer: Medicare Other

## 2012-08-10 LAB — HEPATITIS B SURFACE ANTIGEN: Hepatitis B Surface Ag: NEGATIVE

## 2012-08-10 LAB — BASIC METABOLIC PANEL
BUN: 42 mg/dL — ABNORMAL HIGH (ref 6–23)
Chloride: 93 mEq/L — ABNORMAL LOW (ref 96–112)
GFR calc Af Amer: 5 mL/min — ABNORMAL LOW (ref >90)
Potassium: 4.7 mEq/L (ref 3.5–5.1)
Sodium: 134 mEq/L — ABNORMAL LOW (ref 135–145)

## 2012-08-10 LAB — GLUCOSE, CAPILLARY
Glucose-Capillary: 85 mg/dL (ref 70–99)
Glucose-Capillary: 144 mg/dL — ABNORMAL HIGH (ref 70–99)
Glucose-Capillary: 175 mg/dL — ABNORMAL HIGH (ref 70–99)

## 2012-08-10 LAB — CBC
HCT: 33.4 % — ABNORMAL LOW (ref 39.0–52.0)
Platelets: 257 10*3/uL (ref 150–400)
RDW: 13.2 % (ref 11.5–15.5)
WBC: 6.4 10*3/uL (ref 4.0–10.5)

## 2012-08-10 MED ORDER — LABETALOL HCL 200 MG PO TABS
200.0000 mg | ORAL_TABLET | Freq: Two times a day (BID) | ORAL | Status: DC
  Administered 2012-08-10 – 2012-08-12 (×4): 200 mg via ORAL
  Filled 2012-08-10 (×5): qty 1

## 2012-08-10 MED ORDER — INSULIN GLARGINE 100 UNIT/ML ~~LOC~~ SOLN
15.0000 [IU] | Freq: Every day | SUBCUTANEOUS | Status: DC
  Administered 2012-08-10 – 2012-08-11 (×2): 15 [IU] via SUBCUTANEOUS

## 2012-08-10 MED ORDER — MORPHINE SULFATE 2 MG/ML IJ SOLN
INTRAMUSCULAR | Status: AC
  Filled 2012-08-10: qty 1

## 2012-08-10 MED ORDER — HEPARIN SODIUM (PORCINE) 1000 UNIT/ML DIALYSIS
20.0000 [IU]/kg | INTRAMUSCULAR | Status: DC | PRN
  Administered 2012-08-11: 1300 [IU] via INTRAVENOUS_CENTRAL
  Filled 2012-08-10: qty 2

## 2012-08-10 MED ORDER — PARICALCITOL 5 MCG/ML IV SOLN
2.0000 ug | Freq: Once | INTRAVENOUS | Status: AC
  Administered 2012-08-10: 2 ug via INTRAVENOUS
  Filled 2012-08-10: qty 0.4

## 2012-08-10 MED ORDER — PARICALCITOL 5 MCG/ML IV SOLN
INTRAVENOUS | Status: AC
  Administered 2012-08-10: 2 ug via INTRAVENOUS
  Filled 2012-08-10: qty 1

## 2012-08-10 NOTE — Progress Notes (Signed)
Subjective:  Seen on HD, no current complaints, no recent vomiting or diarrhea.  Objective: Vital signs in last 24 hours: Temp:  [97.6 F (36.4 C)-98.5 F (36.9 C)] 97.6 F (36.4 C) (08/16 0530) Pulse Rate:  [76-83] 83  (08/16 0530) Resp:  [16-18] 18  (08/16 0530) BP: (152-198)/(73-102) 162/83 mmHg (08/16 0530) SpO2:  [98 %-100 %] 100 % (08/16 0530) Weight change:   Intake/Output from previous day: 08/15 0701 - 08/16 0700 In: 480 [P.O.:480] Out: -    EXAM: General appearance:  Alert, comfortable, in no apparent distress Resp:  CTA without rales, rhonchi, or wheezes Cardio:  RRR with Gr II/VI systolic murmur  GI:  + BS, soft and nontender Extremities:  No edema, B BKA Access:  AVG @ LUA with BFR 450 cc/min  Lab Results:  Basename 08/10/12 0640 08/09/12 1030  WBC 6.4 5.3  HGB 11.2* 9.9*  HCT 33.4* 29.9*  PLT 257 227   BMET:  Basename 08/09/12 1030 08/08/12 2316  NA 136 133*  K 3.3* 3.8  CL 95* 90*  CO2 25 27  GLUCOSE 220* 353*  BUN 37* 36*  CREATININE 11.82* 11.04*  CALCIUM 8.6 9.3  ALBUMIN -- 3.6   No results found for this basename: PTH:2 in the last 72 hours Iron Studies: No results found for this basename: IRON,TIBC,TRANSFERRIN,FERRITIN in the last 72 hours  Dialysis Orders: Center: [facility] on TTS.  EDW 65.5 kg HD Bath 2K/2.5Ca Time 4 hrs Heparin 3000 U. Access AVG @ LUA BFR 450 DFR 800  Zemplar 2 mcg IV/HD Epogen 22,000 Units IV/HD Venofer 0  Assessment/Plan: 1. Nausea/vomitng/diarrhea - likely secondary to diabetic gastroparesis and/or gastroenteritis; symptoms better on Zofran and Phenergan, on outpatient Protonix with Zofran and Phenergan PRN.. 2. ESRD - HD on TTS in [place]; postponed from yesterday; K 3.3. HD today. 3. Hypertension/volume - BP 162/95, on outpatient Amlodipine 10 mg qd, Losartan 100 mg, & Labetalol 200 mg bid; wt 65 kg today with EDW 65.5 kg. No fluid removal today. 4. Anemia - Hgb down from 11.2 today, last T-sat 34%, on outpatient  Epogen 22,000 U.  Hold Aranesp. 5. Metabolic bone disease - Ca 8.6, last P 8.7 on 7/25, on Phoslo 3 with meals, Zemplar 2 mcg. 6. Nutrition - last Alb 3.8 on 7/25. 7. DM - on Lantus and SSI.  8. PVD - s/p B BKA.  LOS: 2 days   [name],[name] 08/10/2012,7:51 AM  Patient seen and examined and agree with assessment and plan as above.  Kelly Splinter  MD [facility] [phone] pgr    [phone] cell 08/10/2012, 9:35 AM

## 2012-08-10 NOTE — Progress Notes (Addendum)
Family Medicine Teaching Service Attending Note  I interviewed and examined patient [name] and reviewed their tests and x-rays.  I discussed with Dr. Ardelia Mems and reviewed their note for today.  I agree with their assessment and plan.     Additionally  Still having lower abdomen pain although no nausea or vomiting now.  No fever Alert no acute distress Tender in left and mid lower abdomen without guarding or rebound  Normal wbc   Lipase was slightly increased  Abdomen pain - ? Cause.  No signs of obstruction or infection - monitor.  If persists or develops red flags may need CT Murmur - This seems to be transmitted shunt sounds as changes with arm position Blood pressure - monitor after dialysis.  May need addnl agent perhaps clonidine

## 2012-08-10 NOTE — Progress Notes (Signed)
Family Medicine Teaching Service Daily Progress Note Service Page: [phone]  Patient Assessment: [name] is a 36 y.o. year old male with a history of ESRD and diabetic gastroparesis presenting with two days of nausea, vomiting, and diarrhea  Subjective: thinks nausea is improving, no more vomiting, no more diarrhea. Says he is a little short of breath just sometimes. Unsure about going home today; wants to see how he does after dialysis.  Objective: Temp:  [97.6 F (36.4 C)-98.5 F (36.9 C)] 98.4 F (36.9 C) (08/16 0706) Pulse Rate:  [76-86] 86  (08/16 0800) Resp:  [16-20] 20  (08/16 0706) BP: (149-198)/(73-102) 176/91 mmHg (08/16 0800) SpO2:  [98 %-100 %] 100 % (08/16 0706) Exam: General: NAD, lying in dialysis bed with eyes closed Cardiovascular: RRR, systolic murmur loudest at left upper sternal border Respiratory: CTAB via anterior auscultation Abdomen: mildly tender to palpation  I have reviewed the patient's medications, labs, imaging, and diagnostic testing.  Notable results are summarized below.  CBC BMET   Lab 08/10/12 0640 08/09/12 1030 08/08/12 2316  WBC 6.4 5.3 5.8  HGB 11.2* 9.9* 11.7*  HCT 33.4* 29.9* 35.2*  PLT 257 227 247    Lab 08/10/12 0640 08/09/12 1030 08/08/12 2316  NA 134* 136 133*  K 4.7 3.3* 3.8  CL 93* 95* 90*  CO2 19 25 27   BUN 42* 37* 36*  CREATININE 13.02* 11.82* 11.04*  GLUCOSE 173* 220* 353*  CALCIUM 8.8 8.6 9.3     Imaging/Diagnostic Tests: ECHO: EF 55-60%, mildly dilated left atrium, mildly increased PA pressure, small posterior pericardial effusion  Assessment/Plan: [name] is a 36 y.o. year old male with a history of ESRD and diabetic gastroparesis presenting with two days of nausea, vomiting, and diarrhea. Pt is well known to the family medicine service and is presenting in his usual fashion  1. Nausea & vomiting - most likely due to diabetic gastroparesis, but may have some component of viral gastroenteritis as  pt has had some diarrhea as well, and was recently at a large family reuinion. Questionable coffee ground emesis so gastric ulcer is also on differential, though Hgb at baseline. Had been hydrating with IVF, which was discontinued yesterday afternoon. Continue reglan, and give zofran and phenergan prn nausea. Check gastroccult cards if patient vomits again as pt reported coffee ground emesis. Treat pain with IV morphine 1mg  q2h prn. Will transition to PO pain med now.  2. Diabetes - AG 22 today. Difficult to assess true gap as pt on dialysis, w/ no apparent s/s of distress/acidosis. Currently getting Lantus 20 u per day, CBG's 82-156. If patient stays beyond today, will consider lowering lantus dose as sugars are on the low normal side. 3. New murmur - ECHO done yesterday; no explanation for murmur but does have small posterior pericardial effusion. Murmur possibly referred sound from dialysis fistula. 4. ESRD - Dialysis today, renal on board. 5. FEN/GI: saline lock IV. Renal diet. 6. Prophylaxis: heparin, protonix 7. Disposition: possible discharge this afternoon. 8. Code Status: full code  Chrisandra Netters, MD 08/10/2012, 8:33 AM

## 2012-08-10 NOTE — Procedures (Signed)
I was present at this dialysis session. I have reviewed the session itself and made appropriate changes.   Kelly Splinter, MD [employer] 08/10/2012, 11:34 AM

## 2012-08-11 ENCOUNTER — Inpatient Hospital Stay (HOSPITAL_COMMUNITY): Payer: Medicare Other

## 2012-08-11 LAB — CBC
MCH: 29.6 pg (ref 26.0–34.0)
MCHC: 32.3 g/dL (ref 30.0–36.0)
MCV: 91.7 fL (ref 78.0–100.0)
Platelets: 188 10*3/uL (ref 150–400)
RDW: 13.2 % (ref 11.5–15.5)

## 2012-08-11 LAB — GLUCOSE, CAPILLARY
Glucose-Capillary: 62 mg/dL — ABNORMAL LOW (ref 70–99)
Glucose-Capillary: 206 mg/dL — ABNORMAL HIGH (ref 70–99)
Glucose-Capillary: 300 mg/dL — ABNORMAL HIGH (ref 70–99)

## 2012-08-11 LAB — RENAL FUNCTION PANEL
Albumin: 2.7 g/dL — ABNORMAL LOW (ref 3.5–5.2)
BUN: 19 mg/dL (ref 6–23)
CO2: 28 mEq/L (ref 19–32)
Chloride: 99 mEq/L (ref 96–112)
Creatinine, Ser: 7.49 mg/dL — ABNORMAL HIGH (ref 0.50–1.35)
Glucose, Bld: 172 mg/dL — ABNORMAL HIGH (ref 70–99)

## 2012-08-11 MED ORDER — MORPHINE SULFATE 2 MG/ML IJ SOLN
INTRAMUSCULAR | Status: AC
  Administered 2012-08-11: 1 mg via INTRAVENOUS
  Filled 2012-08-11: qty 1

## 2012-08-11 MED ORDER — HYDROCODONE-ACETAMINOPHEN 5-325 MG PO TABS: 1.0000 | ORAL_TABLET | ORAL | Status: DC | PRN

## 2012-08-11 MED ORDER — IOHEXOL 300 MG/ML  SOLN: 20.0000 mL | INTRAMUSCULAR | Status: AC

## 2012-08-11 MED ORDER — RENA-VITE PO TABS
1.0000 | ORAL_TABLET | Freq: Every day | ORAL | Status: DC
  Administered 2012-08-11 – 2012-08-12 (×2): 1 via ORAL
  Filled 2012-08-11 (×2): qty 1

## 2012-08-11 MED ORDER — PARICALCITOL 5 MCG/ML IV SOLN
INTRAVENOUS | Status: AC
  Administered 2012-08-11: 2 ug via INTRAVENOUS
  Filled 2012-08-11: qty 1

## 2012-08-11 MED ORDER — DARBEPOETIN ALFA-POLYSORBATE 25 MCG/0.42ML IJ SOLN
6.2500 ug | INTRAMUSCULAR | Status: DC
  Administered 2012-08-11: 6.5476 ug via INTRAVENOUS
  Filled 2012-08-11: qty 0.42

## 2012-08-11 MED ORDER — DARBEPOETIN ALFA-POLYSORBATE 25 MCG/0.42ML IJ SOLN
INTRAMUSCULAR | Status: AC
  Administered 2012-08-11: 6.5476 ug via INTRAVENOUS
  Filled 2012-08-11: qty 0.42

## 2012-08-11 NOTE — Procedures (Signed)
I was present at this dialysis session. I have reviewed the session itself and made appropriate changes.   Kelly Splinter, MD [employer] 08/11/2012, 10:15 AM

## 2012-08-11 NOTE — Procedures (Signed)
I was present at this dialysis session. I have reviewed the session itself and made appropriate changes.   Kelly Splinter, MD [employer] 08/11/2012, 10:16 AM

## 2012-08-11 NOTE — Progress Notes (Signed)
Updated progress note:  Was contacted by RN who said patient complaining of 10/10 abdominal pain. Went to see patient, who says pain in epigastric/left upper quadrant area.  Says the pain is worsened with movement and better with sitting still. Was not able to eat lunch because of pain. Last BM was this morning, and per pt it was a large BM.   Exam: Gen: appears uncomfortable but NAD Abd: soft, nondistended, moderately tender to palpation in epigastric and left upper quadrant, nontender on right side   Plan: Discussed with upper level Dr. Verdie Drown. Pt afebrile without elevated white count. Will get CT abdomen/pelvis without contrast to evaluate for cause of persistent abdominal pain. Pt will likely need to stay another day as he is saying pain is 10/10. Continue morphine for pain at this time. Will try to transition to PO pain medicine when possible.

## 2012-08-11 NOTE — Progress Notes (Signed)
[facility] Progress Note  Subjective:  Still with nausea  Objective Filed Vitals:   08/11/12 0800 08/11/12 0830 08/11/12 0900 08/11/12 0930  BP: 133/71 129/72 138/75 143/76  Pulse: 79 80 80 80  Temp:      TempSrc:      Resp:      Height:      Weight:      SpO2:       Physical Exam . Goal 1.8 General: NAD on HD supine Heart: RRR II/VI murmur Lungs: no wheezes or rales Abdomen: soft Extremities: bilateral BKA no edema Dialysis Access:  AVGG patent  Dialysis Orders: Center: [facility] on TTS.  EDW 65.5 kg HD Bath 2K/2.5Ca Time 4 hrs Heparin 3000 U. Access AVG @ LUA BFR 450 DFR 800  Zemplar 2 mcg IV/HD Epogen 2,000 Units IV/HD Venofer 0  Assessment/Plan:  1. Nausea/vomitng/diarrhea - likely secondary to diabetic gastroparesis and/or gastroenteritis; symptoms better on Zofran and Phenergan, on outpatient Protonix with Zofran and Phenergan PRN.. 2. ESRD - HD on TTS in ; postponed from yesterday; K 3.8 today 3. Hypertension/volume - BP 162/95, on outpatient Amlodipine 10 mg qd, Losartan 100 mg, & Labetalol 200 mg bid; 4. Anemia - Hgb down to 9.4 from 11.2 today, last T-sat 34%, on outpatient Epogen 2,000 U.  dose aranesp 6.25 today. 5. Metabolic bone disease - Ca/P ok , on Phoslo 3 with meals, Zemplar 2 mcg. 6. Nutrition - last Alb 3.8 on 7/25. 7. DM - on Lantus and SSI.  8. PVD - s/p B BKA.  Myriam Jacobson, PA-C [facility]  08/11/2012,10:01 AM  LOS: 3 days   Patient seen and examined and agree with assessment and plan as above.  Kelly Splinter  MD [facility] [phone] pgr    [phone] cell 08/11/2012, 10:15 AM   Additional Objective Labs: Basic Metabolic Panel:  Lab 0000000 0700 08/10/12 0640 08/09/12 1030  NA 137 134* 136  K 3.8 4.7 3.3*  CL 99 93* 95*  CO2 28 19 25   GLUCOSE 172* 173* 220*  BUN 19 42* 37*  CREATININE 7.49* 13.02* 11.82*  CALCIUM 8.7 8.8 8.6  ALB -- -- --  PHOS 4.5 -- --     Liver Function Tests:  Lab 08/11/12 0700 08/08/12 2316  AST -- 12  ALT -- 7  ALKPHOS -- 105  BILITOT -- 0.2*  PROT -- 8.0  ALBUMIN 2.7* 3.6    Lab 08/08/12 2316  LIPASE 64*  AMYLASE --   CBC:  Lab 08/11/12 0700 08/10/12 0640 08/09/12 1030 08/08/12 2316  WBC 6.0 6.4 5.3 --  NEUTROABS -- -- -- --  HGB 9.4* 11.2* 9.9* --  HCT 28.8* 33.4* 29.9* --  MCV 91.7 91.0 89.0 90.3  PLT 188 257 227 --   Blood Culture    Component Value Date/Time   SDES URINE, RANDOM 01/31/2012 2146   SPECREQUEST NONE 01/31/2012 2146   CULT NO GROWTH 01/31/2012 2146   REPTSTATUS 02/01/2012 FINAL 01/31/2012 2146    CBG:  Lab 08/10/12 2127 08/10/12 1630 08/10/12 1331 08/10/12 0742 08/09/12 2307  GLUCAP 175* 227* 144* 137* 82   Iron Studies: No results found for this basename: IRON,TIBC,TRANSFERRIN,FERRITIN in the last 72 hours Studies/Results: No results found. Medications:      . amLODipine  10 mg Oral QHS  . aspirin EC  81 mg Oral Daily  . calcium acetate  667 mg Oral TID WC  . heparin  5,000 Units Subcutaneous Q8H  .  hydrALAZINE  10 mg Intravenous Once  . insulin aspart  0-15 Units Subcutaneous TID WC  . insulin aspart  0-5 Units Subcutaneous QHS  . insulin glargine  15 Units Subcutaneous QHS  . labetalol  200 mg Oral BID  . losartan  100 mg Oral QHS  . metoCLOPramide  20 mg Oral TID AC & HS  . pantoprazole (PROTONIX) IV  40 mg Intravenous Q24H  . paricalcitol  2 mcg Intravenous Q T,Th,Sa-HD  . paricalcitol  2 mcg Intravenous Once in dialysis  . sertraline  50 mg Oral Daily  . DISCONTD: insulin glargine  20 Units Subcutaneous QHS

## 2012-08-11 NOTE — Progress Notes (Signed)
Family Medicine Teaching Service Daily Progress Note Service Page: [phone]  Patient Assessment: [name] is a 36 y.o. year old male with a history of ESRD and diabetic gastroparesis presenting with two days of nausea, vomiting, and diarrhea  Subjective: This morning, states abdominal pain is improved; had some nausea last night but no vomiting. Wants to go home today. Feeling jittery, reports CBG was just in low 60s.  Objective: Temp:  [97.9 F (36.6 C)-98.7 F (37.1 C)] 98.2 F (36.8 C) (08/17 0620) Pulse Rate:  [79-91] 80  (08/17 0900) Resp:  [16-20] 16  (08/17 0620) BP: (121-194)/(63-103) 138/75 mmHg (08/17 0900) SpO2:  [97 %-100 %] 97 % (08/17 0620) Weight:  [147 lb 0.8 oz (66.7 kg)-149 lb 11.1 oz (67.9 kg)] 149 lb 11.1 oz (67.9 kg) (08/17 0620) Exam this AM: General: NAD, lying in bed Cardiovascular: RRR, murmur not auscultated when arm is raised above head Respiratory: CTAB via anterior auscultation Abdomen: minimally tender to palpation  I have reviewed the patient's medications, labs, imaging, and diagnostic testing.  Notable results are summarized below.  CBC BMET   Lab 08/11/12 0700 08/10/12 0640 08/09/12 1030  WBC 6.0 6.4 5.3  HGB 9.4* 11.2* 9.9*  HCT 28.8* 33.4* 29.9*  PLT 188 257 227    Lab 08/11/12 0700 08/10/12 0640 08/09/12 1030  NA 137 134* 136  K 3.8 4.7 3.3*  CL 99 93* 95*  CO2 28 19 25   BUN 19 42* 37*  CREATININE 7.49* 13.02* 11.82*  GLUCOSE 172* 173* 220*  CALCIUM 8.7 8.8 8.6     Imaging/Diagnostic Tests: ECHO: EF 55-60%, mildly dilated left atrium, mildly increased PA pressure, small posterior pericardial effusion  Assessment/Plan: ROWNAN Caldwell is a 36 y.o. year old male with a history of ESRD and diabetic gastroparesis presenting with two days of nausea, vomiting, and diarrhea. Pt is well known to the family medicine service and is presenting in his usual fashion  1. Nausea & vomiting - most likely due to diabetic  gastroparesis, but may have some component of viral gastroenteritis as pt has had some diarrhea as well, and was recently at a large family reuinion. Questionable coffee ground emesis so gastric ulcer is also on differential, though Hgb at baseline. Had been hydrating with IVF, which was discontinued yesterday afternoon. Continue reglan, and give zofran and phenergan prn nausea. Check gastroccult cards if patient vomits again as pt reported coffee ground emesis. Treat pain with IV morphine 1mg  q2h prn. Will transition to PO pain med today.  2. Diabetes - AG 22 today. Difficult to assess true gap as pt on dialysis, w/ no apparent s/s of distress/acidosis. Currently getting Lantus 15 u per day, CBG's 144-227. Sugar was 63 when I saw him, and was saying he felt shaky. Will decrease Lantus dose even further if stays. Plan to resume home insulin regimen at d/c. 3. New murmur - ECHO: no explanation for murmur but does have small posterior pericardial effusion. Murmur possibly referred sound from dialysis fistula. 4. ESRD - Dialysis yesterday and today, renal on board. 5. FEN/GI: saline lock IV. Renal diet. 6. Prophylaxis: heparin, protonix 7. Disposition: plan to discharge this afternoon. 8. Code Status: full code  Chrisandra Netters, MD 08/11/2012, 8:43 AM

## 2012-08-12 LAB — CBC
HCT: 31.2 % — ABNORMAL LOW (ref 39.0–52.0)
MCH: 30.3 pg (ref 26.0–34.0)
MCHC: 32.7 g/dL (ref 30.0–36.0)
MCV: 92.6 fL (ref 78.0–100.0)
Platelets: 180 10*3/uL (ref 150–400)
RDW: 13.4 % (ref 11.5–15.5)

## 2012-08-12 MED ORDER — INSULIN DETEMIR 100 UNIT/ML ~~LOC~~ SOLN: 15.0000 [IU] | Freq: Every morning | SUBCUTANEOUS | Status: DC

## 2012-08-12 MED ORDER — GLUCOSE 40 % PO GEL
ORAL | Status: AC
  Administered 2012-08-12: 37.5 g
  Filled 2012-08-12: qty 1

## 2012-08-12 MED ORDER — HYDROCODONE-ACETAMINOPHEN 5-325 MG PO TABS: 1.0000 | ORAL_TABLET | ORAL | Status: DC | PRN

## 2012-08-12 MED ORDER — METOCLOPRAMIDE HCL 10 MG PO TABS
10.0000 mg | ORAL_TABLET | Freq: Three times a day (TID) | ORAL | Status: DC
  Administered 2012-08-12: 10 mg via ORAL
  Filled 2012-08-12 (×5): qty 1

## 2012-08-12 MED ORDER — INSULIN GLARGINE 100 UNIT/ML ~~LOC~~ SOLN
15.0000 [IU] | Freq: Every day | SUBCUTANEOUS | Status: DC
  Administered 2012-08-12: 15 [IU] via SUBCUTANEOUS

## 2012-08-12 NOTE — Discharge Planning (Deleted)
Physician Discharge Summary  Patient ID: [name] MRN: [id number] DOB/AGE: 38-Oct-1977 38 y.o.  Admit date: 08/08/2012 Discharge date: 08/12/2012  Discharge Diagnoses:  Nausea, vomiting, diarrhea--secondary to gastroenteritis overlying chronic diabetic gastroparesis Epigastric/left upper quadrant abdominal pain, etiology unclear  Discharged Condition: stable  Hospital Course: This is a 36 year old African-American male with a history of type 1 diabetes who is s/p bilateral BKA, has ESRD on TThSa dialysis, and suspected gastroparesis; hyperlipidemia; depression/PTSD who presented on 08/15 with nausea/vomiting/diarrhea.  GI # Nausea/vomiting/diarrhea--resolved spontaneously. Suspect component of gastroenteritis overlying chronic gastroparesis from diabetes. -He was initially placed on a higher dose of Reglan at 20 mg tid. It was lowered to his usual dose of 10 mg qid on the day of discharge. # Abdominal pain--on 08/17 his disharge was held due to his complaining of severe LUQ/epigastric abdominal pain exacerbated by movement. His LFTs had been normal and his WBC had been normal and he has been afebrile. His lipase on admission was mildly elevated at 64. CT of the abdomen without contrast was ordered to rule-out pancreatic necrosis or other GI pathology, and it did not show any abnormalities. His abdominal pain had improved significantly with one dose of morphine and overnight. He denies any abdominal pain at the time of discharge.  He was given an Rx for Vicodin (10 tablets) to take as needed until he could schedule a follow-up appointment the day after his discharge day.  # History of GERD--he was continued on home Protonix.   ENDO # Hypoglycemic to the 50-70s AM 08/18--after he did not receive his usual bedtime snack. We decreased his Levemir from 20 to 15 units and changed time of dose from bedtime to the morning. He was asked to hold in dose of Levemir the evening of the day of  discharge and asked to resume the next morning.  # History of type 1 diabetes uncontrolled with neuropathy, retinopathy, gastroparesis--HgbA1c 03/2012 was 11.8.   Lab 08/11/12 2123 08/11/12 1848 08/11/12 1731 08/11/12 1629 08/11/12 1320  GLUCAP 167* 242* 283* 300* 206*   CV # History of hypertension--controlled on home medication Norvasc 10, labetalol 200 bid, losartan 100 qhs  RENAL # History of ESRD, anemia of CKD -T, Th, Sa dialysis  PSYCH # History of depression # Marijuana use--UDS positive for THC -Continue home Zoloft 50    FEN/GI -Diet: Renal  Consults: nephrology (for dialysis)  Disposition: 01-Home or Self Care. He lives with his cousin, aunt, and other family members.    Medication List  As of 08/12/2012  7:08 AM   TAKE these medications         amLODipine 10 MG tablet   Commonly known as: NORVASC   Take 10 mg by mouth at bedtime.      aspirin 81 MG EC tablet   Take 81 mg by mouth daily.      calcium acetate 667 MG capsule   Commonly known as: PHOSLO   Take 1 capsule (667 mg total) by mouth 3 (three) times daily with meals.      glucagon 1 MG injection   Inject 1 mg into the vein once as needed. For low blood sugar      HYDROcodone-acetaminophen 5-325 MG per tablet   Commonly known as: NORCO/VICODIN   Take 1 tablet by mouth every 4 (four) hours as needed.      insulin aspart 100 UNIT/ML injection   Commonly known as: novoLOG   Inject 2-10 Units into the skin 3 (three) times daily.  Sliding scale      insulin detemir 100 UNIT/ML injection   Commonly known as: LEVEMIR   Inject 15 Units into the skin every morning.      labetalol 200 MG tablet   Commonly known as: NORMODYNE   Take 1 tablet (200 mg total) by mouth 2 (two) times daily.      losartan 100 MG tablet   Commonly known as: COZAAR   Take 1 tablet (100 mg total) by mouth at bedtime.      metoCLOPramide 10 MG tablet   Commonly known as: REGLAN   Take 1 tablet (10 mg total) by mouth 4 (four)  times daily.      ondansetron 4 MG tablet   Commonly known as: ZOFRAN   Take 1 tablet (4 mg total) by mouth every 8 (eight) hours as needed. For nausea      pantoprazole 40 MG tablet   Commonly known as: PROTONIX   Take 1 tablet (40 mg total) by mouth 2 (two) times daily.      promethazine 12.5 MG tablet   Commonly known as: PHENERGAN   Take 12.5 mg by mouth every 6 (six) hours as needed. For nausea      sertraline 50 MG tablet   Commonly known as: ZOLOFT   Take 50 mg by mouth daily.           Follow-up Information    Follow up with Andrew Abed, MD. (Call on Monday to schedule hospital follow-up)    Contact information:   [place] [phone]          Signed: Verdie Caldwell, [place] 08/12/2012, 7:08 AM

## 2012-08-12 NOTE — Progress Notes (Signed)
Subjective: His abdominal pain is improved.  He was able to tolerate his diet yesterday, although he "did not eat that much". He drank ample fluids yesterday; no problem tolerating fluids.   This morning, he is hypoglycemic to the 70s and symptomatic (diaphoretic). He usually eats a snack at bedtime and was not given one yesterday.   Objective: Vital signs in last 24 hours: Temp:  [98.4 F (36.9 C)-98.7 F (37.1 C)] 98.5 F (36.9 C) (08/18 0600) Pulse Rate:  [79-90] 82  (08/18 0600) Resp:  [16-18] 16  (08/18 0600) BP: (128-171)/(66-90) 139/69 mmHg (08/18 0600) SpO2:  [97 %-100 %] 100 % (08/18 0600) Weight:  [145 lb 8.1 oz (66 kg)] 145 lb 8.1 oz (66 kg) (08/17 1050)  Physical exam: GEN: diaphoretic NEURO: alert and oriented; appropriate to questions CV: RRR PULM: NI WOB; no wheezes/rales ABD: NABS, soft, NT, ND EXT: bilateral BKA  Labs: CBC, lipase pending  Studies/Results: Ct Abdomen Pelvis Wo Contrast  08/11/2012 IMPRESSION:  1. No acute process in the abdomen or pelvis. 2.  Age advanced atherosclerosis, consistent with history of chronic renal failure.  Vascular calcifications medial the kidneys. Small stones are difficult to exclude. 3.  Improved urinary bladder wall thickening.  Question a component bladder outlet obstruction.    Scheduled Meds:   . amLODipine  10 mg Oral QHS  . aspirin EC  81 mg Oral Daily  . calcium acetate  667 mg Oral TID WC  . darbepoetin (ARANESP) injection - DIALYSIS  6.5476 mcg Intravenous Q Sat-HD  . heparin  5,000 Units Subcutaneous Q8H  . hydrALAZINE  10 mg Intravenous Once  . insulin aspart  0-15 Units Subcutaneous TID WC  . insulin aspart  0-5 Units Subcutaneous QHS  . insulin glargine  15 Units Subcutaneous QHS  . iohexol  20 mL Oral Q1 Hr x 2  . labetalol  200 mg Oral BID  . losartan  100 mg Oral QHS  . metoCLOPramide  10 mg Oral TID AC & HS  . multivitamin  1 tablet Oral Daily  . pantoprazole (PROTONIX) IV  40 mg Intravenous Q24H    . paricalcitol  2 mcg Intravenous Q T,Th,Sa-HD  . sertraline  50 mg Oral Daily  . DISCONTD: metoCLOPramide  20 mg Oral TID AC & HS   Continuous Infusions:  PRN Meds:acetaminophen, acetaminophen, heparin, hydrALAZINE, HYDROcodone-acetaminophen, ondansetron (ZOFRAN) IV, ondansetron, promethazine, DISCONTD:  morphine injection He has not required any Vicodin overnight.  He last received pain medication (morphine) at 15:00 pm. He has not required prn anti-emetic since 08/15.   Assessment/Plan: This is a 36 year old African-American male with a history of type 1 diabetes who is s/p bilateral BKA, has ESRD on TThSa dialysis, and suspected gastroparesis; hyperlipidemia; depression/PTSD who presented on 08/15 with nausea/vomiting/diarrhea.  GI # Nausea/vomiting/diarrhea--resolved spontaneously. Suspect component of gastroenteritis overlying chronic gastroparesis from diabetes. -He was initially placed on a higher dose of Reglan at 20 mg tid. Will decrease down to 10 mg qid due to maximum dosing 40 mg qd.  # Abdominal pain--yesterday his discharge was held due to hsi complaining of severe LUQ/epigastric abdominal pain exacerbated by movement. CT of the abdomen without contrast was ordered and did not show any abnormalities.  He denies any abdominal pain this morning.  -Will follow-up on WBC and lipase this morning. His lipase on 08/14 was mildly elevated at 64>>>Lipase 36, WBC remains WNL 7.1. Hgb has improved from yesterday 9.4 to today 10.2.  -Continue Phenergan prn   ENDO #  Hypoglycemic to the 50-70s this AM # History of type 1 diabetes uncontrolled with neuropathy, retinopathy, gastroparesis--HgbA1c 03/2012 was 11.8.   Lab 08/11/12 2123 08/11/12 1848 08/11/12 1731 08/11/12 1629 08/11/12 1320  GLUCAP 167* 242* 283* 300* 206*  He is hypoglycemic this morning. He was in the 70s, but his sugars went down further to the 50s despite several cups of juice. He did not have his usual evening snack (he  reports he was not given one, and he did not ask for one).  -Will give glucose gel -He is on 15 U Lantus qhs. Will change to qAM. Continue SSI in hospital; he received 5 U yesterday total Novolog.  -Continue home ASA 81   CV # History of hypertension--controlled on home medication Norvasc 10, labetalol 200 bid, losartan 100 qhs  RENAL # History of ESRD, anemia of CKD -T, Th, Sa dialysis  PSYCH # History of depression # Marijuana use--UDS positive for THC -Continue home Zoloft 50    FEN/GI -Diet: Renal  PPx: -DVT PPx: heparin SQ; he has been receiving some doses -History of GERD: Protonix  Disposition: discharge to home today after sugars stabilize and if GI symptoms remain stable He lives with his aunt, cousin, and other family members    LOS: 4 days   [name], ANGELA

## 2012-08-12 NOTE — Progress Notes (Signed)
Family Medicine Teaching Service Attending Note  I interviewed and examined patient [name] and reviewed their tests and x-rays.  I discussed with Dr. Verdie Drown and reviewed their note for today.  I agree with their assessment and plan.     Additionally  Feels well and wants to go home.  No abdomen pain Ok to discharge with close follow up at [facility] for abdomen pain and diabetes control

## 2012-08-12 NOTE — Progress Notes (Signed)
[facility] Progress Note  Subjective:  I'm going home today.  Objective Filed Vitals:   08/11/12 2200 08/12/12 0200 08/12/12 0600 08/12/12 0944  BP: 149/73 147/72 139/69 187/91  Pulse: 86 84 82 83  Temp: 98.7 F (37.1 C) 98.5 F (36.9 C) 98.5 F (36.9 C) 98 F (36.7 C)  TempSrc:    Oral  Resp: 16 16 16 18   Height:      Weight:      SpO2: 100% 97% 100% 100%   Physical Exam General: Up in bed Heart: RRR II/VI murmur Lungs: no wheezes or rales Abdomen: soft Extremities: bilateral BKAs - no edema Dialysis Access: left upper AVGG patent  Assessment/Plan:  1. Nausea/vomitng/diarrhea - resolved; likely secondary to diabetic gastroparesis and/or gastroenteritis; symptoms better on Zofran and Phenergan, on outpatient Protonix with Zofran and Phenergan PRN.. 2. ESRD - HD on TTS in [place]; UF 1 liter Saturday with post wt of 66 - no change in EDW at discharge. 3. Hypertension/volume - BP 162/95, on outpatient Amlodipine 10 mg qd, Losartan 100 mg, & Labetalol 200 mg bid; same meds for d/c 4. Anemia - Hgb up to 10.2 (9.4), last T-sat 34%, on outpatient Epogen 2,000 U. dosed aranesp 6.25 8/17 5. Metabolic bone disease - Ca/P ok , on Phoslo 3 with meals, Zemplar 2 mcg. 6. Nutrition - last Alb 3.8 on 7/25. 7. DM - on Lantus and SSI.  8. PVD - s/p B BKA. 9. Disp - d/c today  Myriam Jacobson, PA-C [place] [phone]  08/12/2012,10:47 AM  LOS: 4 days   Patient seen and examined and agree with assessment and plan as above.  Kelly Splinter  MD [facility] [phone] pgr    [phone] cell 08/12/2012, 12:47 PM   Additional Objective Labs: Basic Metabolic Panel:  Lab 0000000 0700 08/10/12 0640 08/09/12 1030  NA 137 134* 136  K 3.8 4.7 3.3*  CL 99 93* 95*  CO2 28 19 25   GLUCOSE 172* 173* 220*  BUN 19 42* 37*  CREATININE 7.49* 13.02* 11.82*  CALCIUM 8.7 8.8 8.6  ALB -- -- --  PHOS 4.5 -- --   Liver Function  Tests:  Lab 08/11/12 0700 08/08/12 2316  AST -- 12  ALT -- 7  ALKPHOS -- 105  BILITOT -- 0.2*  PROT -- 8.0  ALBUMIN 2.7* 3.6    Lab 08/12/12 0515 08/08/12 2316  LIPASE 36 64*  AMYLASE -- --   CBC:  Lab 08/12/12 0515 08/11/12 0700 08/10/12 0640 08/09/12 1030 08/08/12 2316  WBC 7.1 6.0 6.4 -- --  NEUTROABS -- -- -- -- --  HGB 10.2* 9.4* 11.2* -- --  HCT 31.2* 28.8* 33.4* -- --  MCV 92.6 91.7 91.0 89.0 90.3  PLT 180 188 257 -- --   Blood Culture    Component Value Date/Time   SDES URINE, RANDOM 01/31/2012 2146   SPECREQUEST NONE 01/31/2012 2146   CULT NO GROWTH 01/31/2012 2146   REPTSTATUS 02/01/2012 FINAL 01/31/2012 2146    Cardiac Enzymes: No results found for this basename: CKTOTAL:5,CKMB:5,CKMBINDEX:5,TROPONINI:5 in the last 168 hours CBG:  Lab 08/12/12 0713 08/12/12 0650 08/11/12 2123 08/11/12 1848 08/11/12 1731  GLUCAP 92 59* 167* 242* 283*   Iron Studies: No results found for this basename: IRON,TIBC,TRANSFERRIN,FERRITIN in the last 72 hours Studies/Results: Ct Abdomen Pelvis Wo Contrast  08/11/2012  *RADIOLOGY REPORT*  Clinical Data: Epigastric/left upper quadrant abdominal pain.  CT ABDOMEN AND PELVIS WITHOUT CONTRAST  Technique:  Multidetector  CT imaging of the abdomen and pelvis was performed following the standard protocol without intravenous contrast.  Comparison: 02/01/2012  Findings: Mild scarring at the right lung base anteriorly. Cardiomegaly with no pericardial or pleural effusion.  Normal uninfused appearance of the liver, spleen, stomach, pancreas, gallbladder, biliary tract, adrenal glands. Calcifications about the medial aspect of the kidneys are favored to be primarily vascular.  Small stones are difficult to exclude, especially on the left. No hydronephrosis.  Markedly age advanced aortic and branch vessel atherosclerosis. No retroperitoneal or retrocrural adenopathy.  Normal colon and terminal ileum.  The appendix may be identified on image 78.  No right  lower quadrant inflammation is seen.  Normal small bowel without abdominal ascites.    No pneumatosis or free intraperitoneal air.  No pelvic adenopathy.  The bladder wall thickening is decreased to resolved.  Mild prostatomegaly. No significant free fluid.  No acute osseous abnormality.  IMPRESSION:  1. No acute process in the abdomen or pelvis. 2.  Age advanced atherosclerosis, consistent with history of chronic renal failure.  Vascular calcifications medial the kidneys. Small stones are difficult to exclude. 3.  Improved urinary bladder wall thickening.  Question a component bladder outlet obstruction.  Original Report Authenticated By: Areta Haber, M.D.   Medications:      . amLODipine  10 mg Oral QHS  . aspirin EC  81 mg Oral Daily  . calcium acetate  667 mg Oral TID WC  . darbepoetin (ARANESP) injection - DIALYSIS  6.5476 mcg Intravenous Q Sat-HD  . dextrose      . heparin  5,000 Units Subcutaneous Q8H  . hydrALAZINE  10 mg Intravenous Once  . insulin aspart  0-15 Units Subcutaneous TID WC  . insulin aspart  0-5 Units Subcutaneous QHS  . insulin glargine  15 Units Subcutaneous Daily  . iohexol  20 mL Oral Q1 Hr x 2  . labetalol  200 mg Oral BID  . losartan  100 mg Oral QHS  . metoCLOPramide  10 mg Oral TID AC & HS  . multivitamin  1 tablet Oral Daily  . pantoprazole (PROTONIX) IV  40 mg Intravenous Q24H  . paricalcitol  2 mcg Intravenous Q T,Th,Sa-HD  . sertraline  50 mg Oral Daily  . DISCONTD: insulin glargine  15 Units Subcutaneous QHS  . DISCONTD: metoCLOPramide  20 mg Oral TID AC & HS

## 2012-08-12 NOTE — Progress Notes (Signed)
Hypoglycemic Event  CBG: 59 dl  Treatment: 15 GM gel  Symptoms: Sweaty  Follow-up CBG: [id number] CBG Result:59  Possible Reasons for Event: Inadequate meal intake  Comments/MD notified:yes,  Dr Alvie Heidelberg:  Glucose gel 15 gm given, CBG rechecked 15-30 min  and increased to 92dl MD notified of result.    [name]  Remember to initiate Hypoglycemia Order Set & complete

## 2012-08-12 NOTE — Progress Notes (Signed)
Family Medicine Teaching Service Attending Note  I discussed patient Krupnick  with Dr. Ardelia Mems and reviewed their note for today.  I agree with their assessment and plan.

## 2012-08-13 NOTE — Care Management Note (Signed)
    Page 1 of 1   08/13/2012     8:37:56 AM   CARE MANAGEMENT NOTE 08/13/2012  Patient:  [name], [name]   Account Number:  [id number]  Date Initiated:  08/09/2012  Documentation initiated by:  [facility]  Subjective/Objective Assessment:   Admitted with nausea, vomiting, diarrhea. ESRD-on hemodialysis T,Th,Sat  Lives with cousin, has bilateral prosthetics and uses a walker.     Action/Plan:   return home   Anticipated DC Date:  08/12/2012   Anticipated DC Plan:  [place]  CM consult      Choice offered to / List presented to:             Status of service:  Completed, signed off Medicare Important Message given?   (If response is "NO", the following Medicare IM given date fields will be blank) Date Medicare IM given:   Date Additional Medicare IM given:    Discharge Disposition:  HOME/SELF CARE  Per UR Regulation:  Reviewed for med. necessity/level of care/duration of stay  If discussed at [place] of Stay Meetings, dates discussed:    Comments:

## 2012-08-14 NOTE — Discharge Summary (Signed)
Physician Discharge Summary  Patient ID: [name] MRN: [id number] DOB/AGE: Jul 17, 1976 36 y.o.  Admit date: 08/08/2012 Discharge date: 08/14/2012  Discharge Diagnoses:  Nausea, vomiting, diarrhea--secondary to gastroenteritis overlying chronic diabetic gastroparesis Epigastric/left upper quadrant abdominal pain, etiology unclear  Discharged Condition: stable  Hospital Course: This is a 36 year old African-American male with a history of type 1 diabetes who is s/p bilateral BKA, has ESRD on TThSa dialysis, and suspected gastroparesis; hyperlipidemia; depression/PTSD who presented on 08/15 with nausea/vomiting/diarrhea.  GI # Nausea/vomiting/diarrhea--resolved spontaneously. Suspect component of gastroenteritis overlying chronic gastroparesis from diabetes. -He was initially placed on a higher dose of Reglan at 20 mg tid. It was lowered to his usual dose of 10 mg qid on the day of discharge. # Abdominal pain--on 08/17 his disharge was held due to his complaining of severe LUQ/epigastric abdominal pain exacerbated by movement. His LFTs had been normal and his WBC had been normal and he has been afebrile. His lipase on admission was mildly elevated at 64. CT of the abdomen without contrast was ordered to rule-out pancreatic necrosis or other GI pathology, and it did not show any abnormalities. His abdominal pain had improved significantly with one dose of morphine and overnight. He denies any abdominal pain at the time of discharge.  He was given an Rx for Vicodin (10 tablets) to take as needed until he could schedule a follow-up appointment the day after his discharge day.  # History of GERD--he was continued on home Protonix.   ENDO # Hypoglycemic to the 50-70s AM 08/18--after he did not receive his usual bedtime snack. We decreased his Levemir from 20 to 15 units and changed time of dose from bedtime to the morning. He was asked to hold in dose of Levemir the evening of the day of  discharge and asked to resume the next morning.  # History of type 1 diabetes uncontrolled with neuropathy, retinopathy, gastroparesis--HgbA1c 03/2012 was 11.8.   Lab 08/11/12 2123 08/11/12 1848 08/11/12 1731 08/11/12 1629 08/11/12 1320  GLUCAP 167* 242* 283* 300* 206*   CV # History of hypertension--controlled on home medication Norvasc 10, labetalol 200 bid, losartan 100 qhs  RENAL # History of ESRD, anemia of CKD -T, Th, Sa dialysis  PSYCH # History of depression # Marijuana use--UDS positive for THC -Continue home Zoloft 50    FEN/GI -Diet: Renal  Consults: nephrology (for dialysis)  Disposition: 01-Home or Self Care. He lives with his cousin, aunt, and other family members.   Discharge Orders    Future Appointments: Provider: Department: Dept Phone: Center:   08/17/2012 10:45 AM Alveda Reasons, MD [facility] [phone] [facility]     Medication List  As of 08/14/2012  2:11 PM   TAKE these medications         amLODipine 10 MG tablet   Commonly known as: NORVASC   Take 10 mg by mouth at bedtime.      aspirin 81 MG EC tablet   Take 81 mg by mouth daily.      calcium acetate 667 MG capsule   Commonly known as: PHOSLO   Take 1 capsule (667 mg total) by mouth 3 (three) times daily with meals.      glucagon 1 MG injection   Inject 1 mg into the vein once as needed. For low blood sugar      HYDROcodone-acetaminophen 5-325 MG per tablet   Commonly known as: NORCO/VICODIN   Take 1 tablet by mouth every 4 (four) hours as  needed.      insulin aspart 100 UNIT/ML injection   Commonly known as: novoLOG   Inject 2-10 Units into the skin 3 (three) times daily. Sliding scale      insulin detemir 100 UNIT/ML injection   Commonly known as: LEVEMIR   Inject 15 Units into the skin every morning.      labetalol 200 MG tablet   Commonly known as: NORMODYNE   Take 1 tablet (200 mg total) by mouth 2 (two) times daily.      losartan 100 MG tablet   Commonly known as:  COZAAR   Take 1 tablet (100 mg total) by mouth at bedtime.      metoCLOPramide 10 MG tablet   Commonly known as: REGLAN   Take 1 tablet (10 mg total) by mouth 4 (four) times daily.      ondansetron 4 MG tablet   Commonly known as: ZOFRAN   Take 1 tablet (4 mg total) by mouth every 8 (eight) hours as needed. For nausea      pantoprazole 40 MG tablet   Commonly known as: PROTONIX   Take 1 tablet (40 mg total) by mouth 2 (two) times daily.      promethazine 12.5 MG tablet   Commonly known as: PHENERGAN   Take 12.5 mg by mouth every 6 (six) hours as needed. For nausea      sertraline 50 MG tablet   Commonly known as: ZOLOFT   Take 50 mg by mouth daily.           Follow-up Information    Follow up with [name], MD. (Call on Monday to schedule hospital follow-up)    Contact information:   [place] [phone]          Signed: Verdie Drown, ANGELA 08/14/2012, 2:11 PM

## 2012-08-14 NOTE — Discharge Summary (Signed)
I have reviewed this discharge summary and agree.    

## 2012-08-17 ENCOUNTER — Encounter: Payer: Self-pay | Admitting: Family Medicine

## 2012-08-17 ENCOUNTER — Ambulatory Visit (INDEPENDENT_AMBULATORY_CARE_PROVIDER_SITE_OTHER): Payer: Medicare Other | Admitting: Family Medicine

## 2012-08-17 VITALS — BP 185/103 | HR 86 | Temp 98.5°F | Ht 72.0 in | Wt 165.2 lb

## 2012-08-17 DIAGNOSIS — T8789 Other complications of amputation stump: Secondary | ICD-10-CM

## 2012-08-17 DIAGNOSIS — R112 Nausea with vomiting, unspecified: Secondary | ICD-10-CM

## 2012-08-17 DIAGNOSIS — S88119A Complete traumatic amputation at level between knee and ankle, unspecified lower leg, initial encounter: Secondary | ICD-10-CM

## 2012-08-17 DIAGNOSIS — M898X9 Other specified disorders of bone, unspecified site: Secondary | ICD-10-CM

## 2012-08-17 DIAGNOSIS — I10 Essential (primary) hypertension: Secondary | ICD-10-CM

## 2012-08-17 DIAGNOSIS — E108 Type 1 diabetes mellitus with unspecified complications: Secondary | ICD-10-CM

## 2012-08-17 DIAGNOSIS — T879 Unspecified complications of amputation stump: Secondary | ICD-10-CM | POA: Insufficient documentation

## 2012-08-17 DIAGNOSIS — M2570 Osteophyte, unspecified joint: Secondary | ICD-10-CM

## 2012-08-17 DIAGNOSIS — M79609 Pain in unspecified limb: Secondary | ICD-10-CM

## 2012-08-17 DIAGNOSIS — E1065 Type 1 diabetes mellitus with hyperglycemia: Secondary | ICD-10-CM

## 2012-08-17 DIAGNOSIS — Z89512 Acquired absence of left leg below knee: Secondary | ICD-10-CM

## 2012-08-17 MED ORDER — CLONIDINE HCL 0.1 MG PO TABS: 0.1000 mg | ORAL_TABLET | Freq: Two times a day (BID) | ORAL | Status: DC

## 2012-08-17 MED ORDER — OXYCODONE-ACETAMINOPHEN 5-325 MG PO TABS: 1.0000 | ORAL_TABLET | Freq: Three times a day (TID) | ORAL | Status: AC | PRN

## 2012-08-17 MED ORDER — INSULIN DETEMIR 100 UNIT/ML ~~LOC~~ SOLN: 25.0000 [IU] | Freq: Every morning | SUBCUTANEOUS | Status: DC

## 2012-08-17 NOTE — Patient Instructions (Addendum)
Nice to see you. Your diabetes is not well controlled. Lets try to increase your levimir to 25 units daily. Goal A1c is <8. Lab Results  Component Value Date   HGBA1C 12.6 08/17/2012   Continue the same sliding scale. Referral to wound care center. Have gastric study done. You may start clonidine for blood pressure twice daily. Please call for problems. Come back in one month or sooner if needed.

## 2012-08-17 NOTE — Progress Notes (Signed)
appt made for pt prior to leaving office to have a gastric emptying study done. This appt is September 4,2013 @ [facility]. Pt informed to be NPO after midnight and not to take any Acid reflux meds to do this study. He voiced understanding and agreed.Maryruth Eve, Lahoma Crocker

## 2012-08-18 NOTE — Assessment & Plan Note (Signed)
Callus on left stump causing pain and irritation. No sign of infection. Refer to wound center.

## 2012-08-18 NOTE — Assessment & Plan Note (Signed)
Have provided rx #15 low dose percocet for callus on stump exacerbating the pain. Discussed side effects of long term narcotics and advised he would not receive long term narcotics. To f/u at wound center.

## 2012-08-18 NOTE — Assessment & Plan Note (Signed)
Requests referral for home assistance/care services since he is moving out on his own and mobility is diminished with bilateral BKA, stump pain. Form completed.

## 2012-08-18 NOTE — Progress Notes (Signed)
  Subjective:    Patient ID: [name], male    DOB: [birth year], 36 y.o.   MRN: [id number]  [facility] f/u. D/C 8/20 for nausea and emesis.  1. N/V. Still present but manageable with the reglan, phenergan. Has plenty at home. This is a chronic problem, never had a gastric emptying study performed. States he saw a GI specialist years ago and negative EGD then. Denies hematemesis, diarrhea. Has a BM every 1-2 days.  2. Chronic stump pain. Currently exacerbated by a callus on left anterior stump, where prosthetic rubs. Has broken open. Requests wound center referral. No redness, pus, swelling, fever noted. Previously on narcotics for this, has been weaned off by providers and gets from ED occasionally.   3. HTN. States he does not take norvasc due to causing malaise and dizzyness. Does not bring meds today, but says he is compliant with losartan and labetolol. Maybe also taking clonidine, but he isn't sure. Denies syncope, swelling, dyspnea. BP Readings from Last 3 Encounters:  08/17/12 185/103  08/12/12 187/91  07/24/12 185/120   4. DM. Type I, uncontrolled. Normally insulin is titrated by nephrologist and patient. He is discouraged by recent uncontrolled A1c. He had one episode hypoglycemia in the hospital and his levimir was decreased. Sensitive sliding scale-endorses compliance. Since discharge states CBGs are mildly elevated, no hypoglycemic episodes but does not record values.  Lab Results  Component Value Date   HGBA1C 12.6 08/17/2012   5. ESRD. Having volume fluctuations. States he received HD more than typical schedule this week. His weight is increased. No dyspnea or leg swelling noted.  Wt Readings from Last 3 Encounters:  08/17/12 165 lb 3.2 oz (74.934 kg)  08/11/12 145 lb 8.1 oz (66 kg)  04/19/12 149 lb 11.1 oz (67.9 kg)   Review of Systems See HPI otherwise negative.  reports that he has never smoked. He has never used smokeless tobacco.     Objective:   Physical  Exam  Vitals reviewed. Constitutional: He is oriented to person, place, and time. He appears well-developed and well-nourished. No distress.  HENT:  Head: Normocephalic and atraumatic.  Eyes: EOM are normal.  Cardiovascular: Normal rate, regular rhythm and normal heart sounds.   Pulmonary/Chest: Effort normal and breath sounds normal. No respiratory distress. He has no wheezes. He has no rales.  Abdominal: Soft. Bowel sounds are normal. He exhibits no distension. There is tenderness. There is no rebound and no guarding.       Mild epigastric TTP.  Musculoskeletal: He exhibits no edema and no tenderness.       Bilateral BKA. Left anterior tibial tuberosity with thickened hypertrophic 3-4 cm callus, central splitting. No erythema or pus noted, no abscess.  Neurological: He is alert and oriented to person, place, and time.  Skin: No rash noted.  Psychiatric: He has a normal mood and affect.        Assessment & Plan:

## 2012-08-18 NOTE — Assessment & Plan Note (Signed)
Improved but chronic. Will try to get gastric emptying study scheduled.  Continue reglan and phenergan as needed. Follow up in 3 weeks.

## 2012-08-18 NOTE — Assessment & Plan Note (Signed)
Uncontrolled. Will increase levimir dose to 25 units daily (was previously on lantus 55 units daily). Advised to call MD for hypoglycemia. Sensitive sliding scale with novolog, advised to hold novolog if not eating a meal. F/u in 2-4 weeks.

## 2012-08-18 NOTE — Assessment & Plan Note (Signed)
Seems to be elevated chronically. Perhaps related to volume excess currently. Provided with low dose clonidine rx, but he states he may already be on this medication. Requested he call office with medication list once he gets home. F/u as needed, as this seems to be managed primarily with nephrologist.

## 2012-08-20 ENCOUNTER — Encounter (HOSPITAL_COMMUNITY): Payer: Self-pay | Admitting: *Deleted

## 2012-08-20 ENCOUNTER — Inpatient Hospital Stay (HOSPITAL_COMMUNITY)
Admission: EM | Admit: 2012-08-20 | Discharge: 2012-08-22 | DRG: 638 | Disposition: A | Discharge status: Home / Self Care | Payer: Medicare Other | Attending: Family Medicine | Admitting: Family Medicine

## 2012-08-20 ENCOUNTER — Emergency Department (HOSPITAL_COMMUNITY): Payer: Medicare Other

## 2012-08-20 DIAGNOSIS — S88119A Complete traumatic amputation at level between knee and ankle, unspecified lower leg, initial encounter: Secondary | ICD-10-CM

## 2012-08-20 DIAGNOSIS — I10 Essential (primary) hypertension: Secondary | ICD-10-CM

## 2012-08-20 DIAGNOSIS — R680 Hypothermia, not associated with low environmental temperature: Secondary | ICD-10-CM | POA: Diagnosis present

## 2012-08-20 DIAGNOSIS — I120 Hypertensive chronic kidney disease with stage 5 chronic kidney disease or end stage renal disease: Secondary | ICD-10-CM | POA: Diagnosis present

## 2012-08-20 DIAGNOSIS — T68XXXA Hypothermia, initial encounter: Secondary | ICD-10-CM

## 2012-08-20 DIAGNOSIS — M129 Arthropathy, unspecified: Secondary | ICD-10-CM | POA: Diagnosis present

## 2012-08-20 DIAGNOSIS — E1169 Type 2 diabetes mellitus with other specified complication: Secondary | ICD-10-CM

## 2012-08-20 DIAGNOSIS — D649 Anemia, unspecified: Secondary | ICD-10-CM | POA: Diagnosis present

## 2012-08-20 DIAGNOSIS — N186 End stage renal disease: Secondary | ICD-10-CM | POA: Diagnosis present

## 2012-08-20 DIAGNOSIS — E1165 Type 2 diabetes mellitus with hyperglycemia: Secondary | ICD-10-CM | POA: Diagnosis present

## 2012-08-20 DIAGNOSIS — E162 Hypoglycemia, unspecified: Secondary | ICD-10-CM

## 2012-08-20 DIAGNOSIS — E1065 Type 1 diabetes mellitus with hyperglycemia: Principal | ICD-10-CM | POA: Diagnosis present

## 2012-08-20 DIAGNOSIS — E108 Type 1 diabetes mellitus with unspecified complications: Secondary | ICD-10-CM

## 2012-08-20 DIAGNOSIS — Z79899 Other long term (current) drug therapy: Secondary | ICD-10-CM

## 2012-08-20 DIAGNOSIS — K5900 Constipation, unspecified: Secondary | ICD-10-CM | POA: Diagnosis present

## 2012-08-20 DIAGNOSIS — K219 Gastro-esophageal reflux disease without esophagitis: Secondary | ICD-10-CM | POA: Diagnosis present

## 2012-08-20 DIAGNOSIS — IMO0002 Reserved for concepts with insufficient information to code with codable children: Principal | ICD-10-CM | POA: Diagnosis present

## 2012-08-20 DIAGNOSIS — E785 Hyperlipidemia, unspecified: Secondary | ICD-10-CM | POA: Diagnosis present

## 2012-08-20 DIAGNOSIS — Z7982 Long term (current) use of aspirin: Secondary | ICD-10-CM

## 2012-08-20 DIAGNOSIS — Z794 Long term (current) use of insulin: Secondary | ICD-10-CM

## 2012-08-20 DIAGNOSIS — F339 Major depressive disorder, recurrent, unspecified: Secondary | ICD-10-CM | POA: Diagnosis present

## 2012-08-20 LAB — BASIC METABOLIC PANEL
BUN: 42 mg/dL — ABNORMAL HIGH (ref 6–23)
GFR calc non Af Amer: 6 mL/min — ABNORMAL LOW (ref >90)
Glucose, Bld: 52 mg/dL — ABNORMAL LOW (ref 70–99)
Potassium: 4.1 mEq/L (ref 3.5–5.1)

## 2012-08-20 LAB — CBC WITH DIFFERENTIAL/PLATELET
Eosinophils Absolute: 0.7 10*3/uL (ref 0.0–0.7)
Eosinophils Relative: 8 % — ABNORMAL HIGH (ref 0–5)
HCT: 39.4 % (ref 39.0–52.0)
Hemoglobin: 12.9 g/dL — ABNORMAL LOW (ref 13.0–17.0)
Lymphs Abs: 2.9 10*3/uL (ref 0.7–4.0)
MCH: 30.4 pg (ref 26.0–34.0)
MCV: 92.7 fL (ref 78.0–100.0)
Monocytes Absolute: 0.8 10*3/uL (ref 0.1–1.0)
Monocytes Relative: 9 % (ref 3–12)
Neutrophils Relative %: 48 % (ref 43–77)
RBC: 4.25 MIL/uL (ref 4.22–5.81)

## 2012-08-20 LAB — COMPREHENSIVE METABOLIC PANEL
ALT: 16 U/L (ref 0–53)
AST: 16 U/L (ref 0–37)
CO2: 25 mEq/L (ref 19–32)
Calcium: 8.8 mg/dL (ref 8.4–10.5)
Sodium: 136 mEq/L (ref 135–145)
Total Protein: 6.6 g/dL (ref 6.0–8.3)

## 2012-08-20 LAB — GLUCOSE, CAPILLARY
Glucose-Capillary: 179 mg/dL — ABNORMAL HIGH (ref 70–99)
Glucose-Capillary: 187 mg/dL — ABNORMAL HIGH (ref 70–99)

## 2012-08-20 LAB — PHOSPHORUS: Phosphorus: 5.3 mg/dL — ABNORMAL HIGH (ref 2.3–4.6)

## 2012-08-20 MED ORDER — ACETAMINOPHEN 650 MG RE SUPP: 650.0000 mg | Freq: Four times a day (QID) | RECTAL | Status: DC | PRN

## 2012-08-20 MED ORDER — SERTRALINE HCL 50 MG PO TABS
50.0000 mg | ORAL_TABLET | Freq: Every day | ORAL | Status: DC
  Administered 2012-08-20 – 2012-08-22 (×3): 50 mg via ORAL
  Filled 2012-08-20 (×3): qty 1

## 2012-08-20 MED ORDER — LOSARTAN POTASSIUM 50 MG PO TABS
100.0000 mg | ORAL_TABLET | Freq: Every day | ORAL | Status: DC
  Administered 2012-08-20 – 2012-08-21 (×2): 100 mg via ORAL
  Filled 2012-08-20 (×4): qty 2

## 2012-08-20 MED ORDER — HYDROCODONE-ACETAMINOPHEN 5-325 MG PO TABS
1.0000 | ORAL_TABLET | ORAL | Status: DC | PRN
  Administered 2012-08-20: 1 via ORAL
  Administered 2012-08-21: 2 via ORAL
  Filled 2012-08-20: qty 1
  Filled 2012-08-20: qty 2

## 2012-08-20 MED ORDER — PROMETHAZINE HCL 25 MG PO TABS: 12.5000 mg | ORAL_TABLET | Freq: Four times a day (QID) | ORAL | Status: DC | PRN

## 2012-08-20 MED ORDER — PIPERACILLIN-TAZOBACTAM 3.375 G IVPB
3.3750 g | Freq: Once | INTRAVENOUS | Status: AC
  Administered 2012-08-20: 3.375 g via INTRAVENOUS
  Filled 2012-08-20: qty 50

## 2012-08-20 MED ORDER — VANCOMYCIN HCL 1000 MG IV SOLR
750.0000 mg | Freq: Once | INTRAVENOUS | Status: AC
  Administered 2012-08-20: 750 mg via INTRAVENOUS
  Filled 2012-08-20: qty 750

## 2012-08-20 MED ORDER — INSULIN ASPART 100 UNIT/ML ~~LOC~~ SOLN
0.0000 [IU] | Freq: Three times a day (TID) | SUBCUTANEOUS | Status: DC
  Administered 2012-08-21: 9 [IU] via SUBCUTANEOUS
  Administered 2012-08-21: 1 [IU] via SUBCUTANEOUS
  Administered 2012-08-22: 3 [IU] via SUBCUTANEOUS
  Administered 2012-08-22: 2 [IU] via SUBCUTANEOUS

## 2012-08-20 MED ORDER — DEXTROSE 50 % IV SOLN
1.0000 | Freq: Once | INTRAVENOUS | Status: AC
  Administered 2012-08-20: 50 mL via INTRAVENOUS
  Filled 2012-08-20: qty 50

## 2012-08-20 MED ORDER — INSULIN GLARGINE 100 UNIT/ML ~~LOC~~ SOLN
10.0000 [IU] | Freq: Every day | SUBCUTANEOUS | Status: DC
  Administered 2012-08-20: 10 [IU] via SUBCUTANEOUS

## 2012-08-20 MED ORDER — DARBEPOETIN ALFA-POLYSORBATE 60 MCG/0.3ML IJ SOLN: 60.0000 ug | INTRAMUSCULAR | Status: DC

## 2012-08-20 MED ORDER — CLONIDINE HCL 0.1 MG PO TABS
0.1000 mg | ORAL_TABLET | Freq: Two times a day (BID) | ORAL | Status: DC
  Administered 2012-08-20 – 2012-08-22 (×4): 0.1 mg via ORAL
  Filled 2012-08-20 (×6): qty 1

## 2012-08-20 MED ORDER — HEPARIN SODIUM (PORCINE) 5000 UNIT/ML IJ SOLN
5000.0000 [IU] | Freq: Three times a day (TID) | INTRAMUSCULAR | Status: DC
  Filled 2012-08-20 (×6): qty 1

## 2012-08-20 MED ORDER — ASPIRIN 81 MG PO TBEC: 81.0000 mg | DELAYED_RELEASE_TABLET | Freq: Every day | ORAL | Status: DC

## 2012-08-20 MED ORDER — PARICALCITOL 5 MCG/ML IV SOLN
2.0000 ug | INTRAVENOUS | Status: DC
  Administered 2012-08-21: 2 ug via INTRAVENOUS
  Filled 2012-08-20: qty 0.4

## 2012-08-20 MED ORDER — VANCOMYCIN HCL 1000 MG IV SOLR
750.0000 mg | INTRAVENOUS | Status: DC
  Administered 2012-08-21 (×2): 750 mg via INTRAVENOUS
  Filled 2012-08-20 (×2): qty 750

## 2012-08-20 MED ORDER — LABETALOL HCL 200 MG PO TABS
200.0000 mg | ORAL_TABLET | Freq: Two times a day (BID) | ORAL | Status: DC
Start: 2012-08-20 — End: 2012-08-22
  Administered 2012-08-20 – 2012-08-22 (×5): 200 mg via ORAL
  Filled 2012-08-20 (×7): qty 1

## 2012-08-20 MED ORDER — ASPIRIN EC 81 MG PO TBEC
81.0000 mg | DELAYED_RELEASE_TABLET | Freq: Every day | ORAL | Status: DC
  Administered 2012-08-20 – 2012-08-22 (×3): 81 mg via ORAL
  Filled 2012-08-20 (×3): qty 1

## 2012-08-20 MED ORDER — ACETAMINOPHEN 325 MG PO TABS: 650.0000 mg | ORAL_TABLET | Freq: Four times a day (QID) | ORAL | Status: DC | PRN

## 2012-08-20 MED ORDER — METOCLOPRAMIDE HCL 10 MG PO TABS
10.0000 mg | ORAL_TABLET | Freq: Four times a day (QID) | ORAL | Status: DC
  Administered 2012-08-20 – 2012-08-22 (×6): 10 mg via ORAL
  Filled 2012-08-20 (×11): qty 1

## 2012-08-20 MED ORDER — PANTOPRAZOLE SODIUM 40 MG PO TBEC
40.0000 mg | DELAYED_RELEASE_TABLET | Freq: Two times a day (BID) | ORAL | Status: DC
  Administered 2012-08-20 – 2012-08-22 (×5): 40 mg via ORAL
  Filled 2012-08-20 (×5): qty 1

## 2012-08-20 MED ORDER — CALCIUM ACETATE 667 MG PO CAPS
667.0000 mg | ORAL_CAPSULE | Freq: Three times a day (TID) | ORAL | Status: DC
  Administered 2012-08-20: 667 mg via ORAL
  Filled 2012-08-20 (×5): qty 1

## 2012-08-20 MED ORDER — ASPIRIN 81 MG PO CHEW
CHEWABLE_TABLET | ORAL | Status: AC
  Administered 2012-08-20: 81 mg
  Filled 2012-08-20: qty 1

## 2012-08-20 MED ORDER — ONDANSETRON HCL 4 MG PO TABS
4.0000 mg | ORAL_TABLET | Freq: Three times a day (TID) | ORAL | Status: DC | PRN
  Administered 2012-08-21: 4 mg via ORAL
  Filled 2012-08-20: qty 1

## 2012-08-20 MED ORDER — DEXTROSE-NACL 5-0.9 % IV SOLN
INTRAVENOUS | Status: DC
  Administered 2012-08-20: 06:00:00 via INTRAVENOUS

## 2012-08-20 MED ORDER — DEXTROSE 50 % IV SOLN
INTRAVENOUS | Status: AC
  Administered 2012-08-20: 05:00:00
  Filled 2012-08-20: qty 50

## 2012-08-20 MED ORDER — SODIUM CHLORIDE 0.9 % IJ SOLN
3.0000 mL | Freq: Two times a day (BID) | INTRAMUSCULAR | Status: DC
  Administered 2012-08-20 – 2012-08-22 (×4): 3 mL via INTRAVENOUS

## 2012-08-20 MED ORDER — VANCOMYCIN HCL IN DEXTROSE 1-5 GM/200ML-% IV SOLN
1000.0000 mg | Freq: Once | INTRAVENOUS | Status: AC
  Administered 2012-08-20: 1000 mg via INTRAVENOUS
  Filled 2012-08-20: qty 200

## 2012-08-20 MED ORDER — PIPERACILLIN-TAZOBACTAM IN DEX 2-0.25 GM/50ML IV SOLN
2.2500 g | Freq: Three times a day (TID) | INTRAVENOUS | Status: DC
  Administered 2012-08-20 – 2012-08-22 (×4): 2.25 g via INTRAVENOUS
  Filled 2012-08-20 (×8): qty 50

## 2012-08-20 NOTE — ED Notes (Signed)
Bair Hugger applied to patient.

## 2012-08-20 NOTE — ED Notes (Signed)
Pt reports Vicodin does not work for him, pt took Vicodin x1 tab, requesting Percocet in place of Vicodin

## 2012-08-20 NOTE — ED Notes (Signed)
Checked patient cbg it was 78 notified RN Megan of blood sugar

## 2012-08-20 NOTE — ED Notes (Signed)
CBG 179 

## 2012-08-20 NOTE — Consult Note (Signed)
ANTIBIOTIC CONSULT NOTE - INITIAL  Pharmacy Consult for vancomycin/zosyn Indication: rule out sepsis  No Known Allergies  Patient Measurements: Height: 6' (182.9 cm) Weight: 165 lb (74.844 kg) IBW/kg (Calculated) : 77.6   Vital Signs: Temp: 98.3 F (36.8 C) (08/26 1004) Temp src: Oral (08/26 1004) BP: 169/98 mmHg (08/26 1323) Pulse Rate: 82  (08/26 1300) Intake/Output from previous day:   Intake/Output from this shift:    Labs:  Basename 08/20/12 1235 08/20/12 0552  WBC -- 8.5  HGB -- 12.9*  PLT -- 249  LABCREA -- --  CREATININE 9.91* 9.52*   Estimated Creatinine Clearance: 10.8 ml/min (by C-G formula based on Cr of 9.91). No results found for this basename: VANCOTROUGH:2,VANCOPEAK:2,VANCORANDOM:2,GENTTROUGH:2,GENTPEAK:2,GENTRANDOM:2,TOBRATROUGH:2,TOBRAPEAK:2,TOBRARND:2,AMIKACINPEAK:2,AMIKACINTROU:2,AMIKACIN:2, in the last 72 hours   Microbiology: Recent Results (from the past 720 hour(s))  MRSA PCR SCREENING     Status: Normal   Collection Time   08/09/12  5:40 AM      Component Value Range Status Comment   MRSA by PCR NEGATIVE  NEGATIVE Final     Medical History: Past Medical History  Diagnosis Date  . Hypertension   . GERD (gastroesophageal reflux disease)   . Gastropathy   . Chronic pain   . Depression   . Edema   . Headache   . 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 Dialysis patient 04/18/12    "[facility]; [place], [place], [place]"  . Blood transfusion   . Arthritis     "I think I do; just in my fingers & my hands"  . ESRD (end stage renal disease) on dialysis   . Type I diabetes mellitus     "juvenile"  . Gastroparesis    Assessment: 89 yom with T1DM and ESRD presents to [facility] with hypothermia and hypoglycemia. WBC is normal at 8 and temps are now trending back up to normal. Given history of amputations and presentation patient started on empiric vancomycin and zosyn in ED. Will load patient with vancomycin and renally dose adjust  zosyn.  Goal of Therapy:  Pre-hd vancomycin trough 15-25  Plan:  Vancomycin 750 (for a total of 1750mg  load) Continue zosyn at 2.25g q 8 hours Follow culture data Expect patient to continue TTS HD schedule - will follow for changes  Georgina Peer 08/20/2012,2:27 PM

## 2012-08-20 NOTE — ED Notes (Signed)
Advised MD of the patient's chief complaint and presentation.

## 2012-08-20 NOTE — ED Provider Notes (Signed)
History     CSN: [id number]  Arrival date & time 08/20/12  0501   First MD Initiated Contact with Patient 08/20/12 [id number]      Chief Complaint  Patient presents with  . Hypoglycemia    (Consider location/radiation/quality/duration/timing/severity/associated sxs/prior treatment) The history is provided by the patient.   the patient reports that he awoke and was diaphoretic and called EMS.  EMS found the patient on the floor diaphoretic with a blood sugar 24.  The patient received use an amp of D50 and his blood sugar after that was 47.  On arrival to the emergency department he was hypertensive to 260/130.  His initial blood pressure on arrival of emergency department is 221/114.  This time his only complaint is that he feels cold in his teeth are chattering.  He denies recent cough or congestion.  He continues to make urine and reports no dysuria or urinary frequency.  He dialyzes Tuesday Thursday Saturday to his left upper arm fistula.  His last dialysis was Saturday.  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  His had no new drainage or redness from these.  Past Medical History  Diagnosis Date  . Hypertension   . GERD (gastroesophageal reflux disease)   . Gastropathy   . Chronic pain   . Depression   . Edema   . Headache   . 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 Dialysis patient 04/18/12    "[facility]; [place], [place], [place]"  . Blood transfusion   . Arthritis     "I think I do; just in my fingers & my hands"  . ESRD (end stage renal disease) on dialysis   . Type I diabetes mellitus     "juvenile"  . Gastroparesis     Past Surgical History  Procedure Date  . Below knee leg amputation "it's been awhile"    bilaterally  . Cataract extraction ~ 2011    right  . Av fistula placement 08/2011    left upper arm    Family History  Problem Relation Age of Onset  . Diabetes Other   . Hypertension Other   . Heart disease Other     History    Substance Use Topics  . Smoking status: Never Smoker   . Smokeless tobacco: Never Used  . Alcohol Use: No      Review of Systems  All other systems reviewed and are negative.    Allergies  Review of patient's allergies indicates no known allergies.  Home Medications   Current Outpatient Rx  Name Route Sig Dispense Refill  . ASPIRIN 81 MG PO TBEC Oral Take 81 mg by mouth daily.      [name] CALCIUM ACETATE 667 MG PO CAPS Oral Take 1 capsule (667 mg total) by mouth 3 (three) times daily with meals. 90 capsule 0  . CLONIDINE HCL 0.1 MG PO TABS Oral Take 1 tablet (0.1 mg total) by mouth 2 (two) times daily. 90 tablet 3  . INSULIN ASPART 100 UNIT/ML [place]ett SOLN Subcutaneous Inject 2-10 Units into the skin 3 (three) times daily. Sliding scale 5 pen 3  . INSULIN DETEMIR 100 UNIT/ML  SOLN Subcutaneous Inject 25 Units into the skin every morning. 10 mL 12    Dispense enough for one month supply.  [name] LABETALOL HCL 200 MG PO TABS Oral Take 1 tablet (200 mg total) by mouth 2 (two) times daily. 60 tablet 2  . LOSARTAN POTASSIUM 100 MG PO TABS Oral  Take 1 tablet (100 mg total) by mouth at bedtime. 30 tablet 2  . OXYCODONE-ACETAMINOPHEN 5-325 MG PO TABS Oral Take 1 tablet by mouth every 8 (eight) hours as needed for pain. 15 tablet 0  . PANTOPRAZOLE SODIUM 40 MG PO TBEC Oral Take 1 tablet (40 mg total) by mouth 2 (two) times daily. 60 tablet 2  . SERTRALINE HCL 50 MG PO TABS Oral Take 50 mg by mouth daily.    [name] GLUCAGON (RDNA) 1 MG IJ KIT Intravenous Inject 1 mg into the vein once as needed. For low blood sugar    . METOCLOPRAMIDE HCL 10 MG PO TABS Oral Take 1 tablet (10 mg total) by mouth 4 (four) times daily. 120 tablet 2  . ONDANSETRON HCL 4 MG PO TABS Oral Take 1 tablet (4 mg total) by mouth every 8 (eight) hours as needed. For nausea 20 tablet 2  . PROMETHAZINE HCL 12.5 MG PO TABS Oral Take 12.5 mg by mouth every 6 (six) hours as needed. For nausea      BP 168/96  Pulse 71  Temp 97.1 F (36.2  C) (Oral)  Resp 0  SpO2 100%  Physical Exam  Nursing note and vitals reviewed. Constitutional: He is oriented to person, place, and time. He appears well-developed and well-nourished.  HENT:  Head: Normocephalic and atraumatic.  Eyes: EOM are normal.  Neck: Normal range of motion.  Cardiovascular: Normal rate, regular rhythm, normal heart sounds and intact distal pulses.   Pulmonary/Chest: Effort normal and breath sounds normal. No respiratory distress.  Abdominal: Soft. He exhibits no distension. There is no tenderness.  Musculoskeletal: Normal range of motion.       Bilateral distal BKA's.  No signs of infection.  Neurological: He is alert and oriented to person, place, and time.  Skin: Skin is warm and dry.  Psychiatric: He has a normal mood and affect. Judgment normal.    ED Course  Procedures (including critical care time)  Labs Reviewed  GLUCOSE, CAPILLARY - Abnormal; Notable for the following:    Glucose-Capillary 55 (*)     All other components within normal limits  CBC WITH DIFFERENTIAL - Abnormal; Notable for the following:    Hemoglobin 12.9 (*)     Eosinophils Relative 8 (*)     Basophils Relative 2 (*)     All other components within normal limits  BASIC METABOLIC PANEL - Abnormal; Notable for the following:    Chloride 95 (*)     Glucose, Bld 52 (*)     BUN 42 (*)     Creatinine, Ser 9.52 (*)     GFR calc non Af Amer 6 (*)     GFR calc Af Amer 7 (*)     All other components within normal limits  GLUCOSE, CAPILLARY - Abnormal; Notable for the following:    Glucose-Capillary 160 (*)     All other components within normal limits  PROCALCITONIN  LACTIC ACID, PLASMA  CULTURE, BLOOD (ROUTINE X 2)  CULTURE, BLOOD (ROUTINE X 2)  URINE CULTURE  URINALYSIS, ROUTINE W REFLEX MICROSCOPIC   Dg Chest Portable 1 View  08/20/2012  *RADIOLOGY REPORT*  Clinical Data: Hypoglycemia, weakness.  PORTABLE CHEST - 1 VIEW  Comparison: 08/08/2012  Findings: Hypoaeration  results in interstitial vascular crowding. Heart size upper normal, also may be accentuated by respiratory effort/portable technique.  No pleural effusion or pneumothorax. Mild right hemidiaphragm elevation with associated scarring. Otherwise, no focal consolidation.  No acute osseous finding.  IMPRESSION: Hypoaeration.  No focal consolidation.   Original Report Authenticated By: Suanne Marker, M.D.     I personally reviewed the imaging tests through PACS system I reviewed available ER/hospitalization records thought the EMR   1. Hypoglycemia   2. Hypothermia   3. ESRD (end stage renal disease)       MDM  Unclear EEG allergies the patient's hypothermia and hypoglycemia.  He have to consider systemic infections this is sepsis.  He has a mild elevation in his percussive tone in.  Patient is hypertensive here.  He is not tachycardic.  He is at significant risk for bacteremia given his history of end-stage renal disease dialyzing through a left upper arm fistula.  The patient will be given doses of vancomycin and Zosyn.  He'll be admitted to the family practice team.  8:07 AM Spoke to Dr Verlee Rossetti who will evaluate the pt in the ER        Hoy Morn, MD 08/20/12 [phone]

## 2012-08-20 NOTE — ED Notes (Signed)
Spoke with 3300 and admitting doctor and confirmed pt stable enough to go to telemetry as long as CBG was normal x2.

## 2012-08-20 NOTE — ED Notes (Signed)
Unable to obtain an oral or axillary temperature at this time.  Patient feels cold and states he needs several blankets.

## 2012-08-20 NOTE — Consult Note (Signed)
[facility] Renal Consultation Note  Indication for Consultation:  Management of ESRD/hemodialysis; anemia, hypertension/volume and secondary hyperparathyroidism  HPI: [name] is a 36 y.o. male Type 1 IDDM admitted with hypoglycemia and hypothermia.Ems was called to his residence because ''the patient was found on the floor diaphoretic with a blood sugar 24. "The patient received use an amp of D50 and his blood sugar after that was 47. On arrival to the emergency department he was hypertensive to 260/130. His initial blood pressure on arrival of emergency department is 221/114. His last hd was Saturday on schedule without problems, denies fever, chills, chest pain or sob. "Was going to the wound center this week for sore on my right knee that my prosthetic leg was rubbing''.  Reporting his battery " went dead on my glucose machine yesterday."       Past Medical History  Diagnosis Date  . Hypertension   . GERD (gastroesophageal reflux disease)   . Gastropathy   . Chronic pain   . Depression   . Edema   . Headache   . 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 Dialysis patient 04/18/12    "[facility]; [place], [place], [place]"  . Blood transfusion   . Arthritis     "I think I do; just in my fingers & my hands"  . ESRD (end stage renal disease) on dialysis   . Type I diabetes mellitus     "juvenile"  . Gastroparesis     Past Surgical History  Procedure Date  . Below knee leg amputation "it's been awhile"    bilaterally  . Cataract extraction ~ 2011    right  . Av fistula placement 08/2011    left upper arm      Family History  Problem Relation Age of Onset  . Diabetes Other   . Hypertension Other   . Heart disease Other    "  Social = Lives with Elenor Legato, cousin,   Married  With 2 children but "does not live wife." Denies tobacco and etoh, stopped marijuana last month.  No Known Allergies  Prior to Admission medications   Medication Sig  Start Date End Date Taking? Authorizing Provider  aspirin 81 MG EC tablet Take 81 mg by mouth daily.     Yes Historical Provider, MD  calcium acetate (PHOSLO) 667 MG capsule Take 1 capsule (667 mg total) by mouth 3 (three) times daily with meals. 04/19/12  Yes Amber Fidel Levy, MD  cloNIDine (CATAPRES) 0.1 MG tablet Take 1 tablet (0.1 mg total) by mouth 2 (two) times daily. 08/17/12 08/17/13 Yes Clovis Cao, MD  insulin aspart (NOVOLOG FLEXPEN) 100 UNIT/ML injection Inject 2-10 Units into the skin 3 (three) times daily. Sliding scale 04/19/12  Yes Amber Fidel Levy, MD  insulin detemir (LEVEMIR) 100 UNIT/ML injection Inject 25 Units into the skin every morning. 08/17/12 08/17/13 Yes Clovis Cao, MD  labetalol (NORMODYNE) 200 MG tablet Take 1 tablet (200 mg total) by mouth 2 (two) times daily. 04/19/12  Yes Amber Fidel Levy, MD  losartan (COZAAR) 100 MG tablet Take 1 tablet (100 mg total) by mouth at bedtime. 04/19/12  Yes Amber Fidel Levy, MD  oxyCODONE-acetaminophen (ROXICET) 5-325 MG per tablet Take 1 tablet by mouth every 8 (eight) hours as needed for pain. 08/17/12 08/27/12 Yes Clovis Cao, MD  pantoprazole (PROTONIX) 40 MG tablet Take 1 tablet (40 mg total) by mouth 2 (two) times daily. 04/19/12  Yes [employer]  Fidel Levy, MD  sertraline (ZOLOFT) 50 MG tablet Take 50 mg by mouth daily.   Yes Historical Provider, MD  glucagon 1 MG injection Inject 1 mg into the vein once as needed. For low blood sugar    Historical Provider, MD  metoCLOPramide (REGLAN) 10 MG tablet Take 1 tablet (10 mg total) by mouth 4 (four) times daily. 04/19/12   Amber Fidel Levy, MD  ondansetron (ZOFRAN) 4 MG tablet Take 1 tablet (4 mg total) by mouth every 8 (eight) hours as needed. For nausea 04/19/12   Montez Morita, MD  promethazine (PHENERGAN) 12.5 MG tablet Take 12.5 mg by mouth every 6 (six) hours as needed. For nausea    Historical Provider, MD     Anti-infectives     Start     Dose/Rate Route Frequency Ordered Stop    08/21/12 1200   vancomycin (VANCOCIN) 750 mg in sodium chloride 0.9 % 150 mL IVPB        750 mg 150 mL/hr over 60 Minutes Intravenous Every T-Th-Sa (Hemodialysis) 08/20/12 1433     08/20/12 2200   piperacillin-tazobactam (ZOSYN) IVPB 2.25 g        2.25 g 100 mL/hr over 30 Minutes Intravenous 3 times per day 08/20/12 1433     08/20/12 1800   vancomycin (VANCOCIN) 750 mg in sodium chloride 0.9 % 150 mL IVPB        750 mg 150 mL/hr over 60 Minutes Intravenous  Once 08/20/12 1433     08/20/12 0800   vancomycin (VANCOCIN) IVPB 1000 mg/200 mL premix        1,000 mg 200 mL/hr over 60 Minutes Intravenous  Once 08/20/12 0758 08/20/12 1101   08/20/12 0800   piperacillin-tazobactam (ZOSYN) IVPB 3.375 g        3.375 g 12.5 mL/hr over 240 Minutes Intravenous  Once 08/20/12 0758 08/20/12 1249          Results for orders placed during the hospital encounter of 08/20/12 (from the past 48 hour(s))  GLUCOSE, CAPILLARY     Status: Abnormal   Collection Time   08/20/12  5:15 AM      Component Value Range Comment   Glucose-Capillary 55 (*) 70 - 99 mg/dL   CBC WITH DIFFERENTIAL     Status: Abnormal   Collection Time   08/20/12  5:52 AM      Component Value Range Comment   WBC 8.5  4.0 - 10.5 K/uL    RBC 4.25  4.22 - 5.81 MIL/uL    Hemoglobin 12.9 (*) 13.0 - 17.0 g/dL    HCT 39.4  39.0 - 52.0 %    MCV 92.7  78.0 - 100.0 fL    MCH 30.4  26.0 - 34.0 pg    MCHC 32.7  30.0 - 36.0 g/dL    RDW 13.6  11.5 - 15.5 %    Platelets 249  150 - 400 K/uL    Neutrophils Relative 48  43 - 77 %    Neutro Abs 4.1  1.7 - 7.7 K/uL    Lymphocytes Relative 34  12 - 46 %    Lymphs Abs 2.9  0.7 - 4.0 K/uL    Monocytes Relative 9  3 - 12 %    Monocytes Absolute 0.8  0.1 - 1.0 K/uL    Eosinophils Relative 8 (*) 0 - 5 %    Eosinophils Absolute 0.7  0.0 - 0.7 K/uL    Basophils Relative 2 (*) 0 -  1 %    Basophils Absolute 0.1  0.0 - 0.1 K/uL   BASIC METABOLIC PANEL     Status: Abnormal   Collection Time   08/20/12   5:52 AM      Component Value Range Comment   Sodium 138  135 - 145 mEq/L    Potassium 4.1  3.5 - 5.1 mEq/L    Chloride 95 (*) 96 - 112 mEq/L    CO2 24  19 - 32 mEq/L    Glucose, Bld 52 (*) 70 - 99 mg/dL    BUN 42 (*) 6 - 23 mg/dL    Creatinine, Ser 9.52 (*) 0.50 - 1.35 mg/dL    Calcium 9.7  8.4 - 10.5 mg/dL    GFR calc non Af Amer 6 (*) >90 mL/min    GFR calc Af Amer 7 (*) >90 mL/min   PROCALCITONIN     Status: Normal   Collection Time   08/20/12  5:52 AM      Component Value Range Comment   Procalcitonin 2.35     LACTIC ACID, PLASMA     Status: Normal   Collection Time   08/20/12  7:17 AM      Component Value Range Comment   Lactic Acid, Venous 1.5  0.5 - 2.2 mmol/L   GLUCOSE, CAPILLARY     Status: Abnormal   Collection Time   08/20/12  7:49 AM      Component Value Range Comment   Glucose-Capillary 160 (*) 70 - 99 mg/dL   GLUCOSE, CAPILLARY     Status: Abnormal   Collection Time   08/20/12 10:50 AM      Component Value Range Comment   Glucose-Capillary 179 (*) 70 - 99 mg/dL   GLUCOSE, CAPILLARY     Status: Abnormal   Collection Time   08/20/12  1:20 PM      Component Value Range Comment   Glucose-Capillary 187 (*) 70 - 99 mg/dL     ROS: see hpi {ros master: see hpi  Physical Exam: Filed Vitals:   08/20/12 1323  BP: 169/98  Pulse:   Temp:   Resp:     Temp 98.3/ rep 16/pulse 76/ o2 sat 100% ra now in er with warming blanket General: adult bm , nad , thin chronically il , cooperative HEENT: [place], mmm, poor dental hygene Eyes: eomi Neck:  No jvd Heart: RRR, 2/6 sem at apex, no rub Lungs:  cta bilaterally Abdomen: bs pos. Soft ,nontender Extremities: bilat. bkas no edema Skin: bilat . Hardened skin ulcers on biltaleral bkas,and right patella superior aspect. Neuro: ox3, moves all extrem Dialysis Access: pos. Bruit left upper arm  avgg  Dialysis Orders: Center: [facility]  on TTS . EDW 65.5 HD Bath 2k , ca 2.5  Time 4.0 Heparin 3.44ml. Access  Left upper arm avgg BFR 400  DFR 800    Zemplar 2.0 mcg IV/HD Epogen 2,200   Units IV/HD  Venofer  50mg   weekly    Assessment/Plan 1.    Hypoglycemia/ Type I IDDM= admit service managing 1. ESRD -  HD T[facility]( [facility]) 2. Hypertension/volume  - bp meds and hd 3. Anemia  - 12.6 hgb  Fu in am if >12 dc aranesp/ weekly venofer 4. Metabolic bone disease -  binsders and 87mcg zemplar 5. Nutrition - high protein renal carbohydrate mod. 6. Hypothermia reported= on warming blanket now?? Infection/ admit team managing Andrew Haber, PA-C [facility] [phone] 08/20/2012, 1:26 PM   Patient seen  and examined and agree with assessment and plan as above.  Andrew Splinter  MD [employer] [phone] pgr    [phone] cell 08/20/2012, 5:24 PM

## 2012-08-20 NOTE — ED Notes (Signed)
Patient is resting comfortably, rr even and unlabored. Will continue to monitor

## 2012-08-20 NOTE — ED Notes (Signed)
Pt here by EMS, here from home, h/o HD DM and BLE BKA. EMS called out for low BS. Pt found on floor in living room, cool pale diaphoretic with CBG of 24. Pt was awake talking and confused. Given juice peanut butter and and insta-glucose & 1 amp D50. Last BS 47 PTA PTA.  Airway intact on arrival, LS CTA. (HD T,TH, S at [employer], L upper arm shunt). Pt arrives awake, interactive, NAD, calm, skin cool and clammy, speaking in short choppy phrases talking thru teeth, breath smells of peanut butter. BP elevated 260/130. "was released from [facility] earlier this week". Mentation improved after dextrose per EMS.

## 2012-08-20 NOTE — H&P (Signed)
FMTS Attending Admission Note: Dorcas Mcmurray MD [phone] pager office [phone] I  have seen and examined this patient, reviewed their chart. I have discussed this patient with the resident. I agree with the resident's findings, assessment and care plan. Unclear why a simple 5 unit change in insulin dosing would have this large effect of hypoglycemia and hypothermia. He relates no other issues.

## 2012-08-20 NOTE — H&P (Signed)
[facility] Admission History and Physical  Patient name: [name] Medical record number: [id number] Date of birth: March 25, 1976 Age: 36 y.o. Gender: male  Primary Care Provider: [name], MD  Chief Complaint: Hypoglycemia, hypothermia  History of Present Illness: [name] is a 36 y.o. year old male with ESRD, DM, and recurrent n/v recently discharged 8/18 presenting with hypoglycemia from home via EMS. Patient previously on levimir dose 20 units daily and sensitive sliding scale. This was decreased to 15 units daily during hospital stay for gastroparesis, nausea and emesis. Patient followed up in clinic 3 days ago, with improvement in appetite and A1c 12.6. His levimir dose was increased to 25 units q am. He started this Saturday morning and had well controlled CBGs with fasting 86-92, postprandial was 220. He checked prior to dinner last evening and was 92. Ate good dinner with fried chicken and rice. At a midnight snack. His glucometer battery died so did not check an hs value. He woke up in the night with diaphoresis, jitteriness, confusion. Yelled for help and his aunt/cousin called EMS who found CBG 24, administered D5 amp and improved only to 40. Was transported to ED, found hypothermia and continued on D5 with improvement to 160 currently. Started empirically on vanc/zosyn for concern of possible sepsis.  Denies any fevers, abdominal pain, emesis, diarrhea, skin redness or pus, sore throat, rash, cough, chest pain. Has history of hypoglycemia requiring hospitalization 1-2 years ago.    Patient Active Problem List  Diagnosis  . DIABETES MELLITUS, TYPE I, UNCONTROLLED, WITH COMPLICATIONS  . HYPERLIPIDEMIA  . DEPRESSION, MAJOR, RECURRENT  . POST TRAUMATIC STRESS DISORDER  . HYPERTENSION, BENIGN SYSTEMIC  . GERD  . ESRD (end stage renal disease)  . IMPOTENCE, ORGANIC  . Constipation  . Amputation stump pain  . Nausea & vomiting  .  Metabolic bone disease  . Gastroenteritis  . BKA stump complication  . S/P bilateral BKA (below knee amputation)   Past Medical History: Past Medical History  Diagnosis Date  . Hypertension   . GERD (gastroesophageal reflux disease)   . Gastropathy   . Chronic pain   . Depression   . Edema   . Headache   . 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 Dialysis patient 04/18/12    "[facility]; [place], [place], [place]"  . Blood transfusion   . Arthritis     "I think I do; just in my fingers & my hands"  . ESRD (end stage renal disease) on dialysis   . Type I diabetes mellitus     "juvenile"  . Gastroparesis     Past Surgical History: Past Surgical History  Procedure Date  . Below knee leg amputation "it's been awhile"    bilaterally  . Cataract extraction ~ 2011    right  . Av fistula placement 08/2011    left upper arm    Social History: History   Social History  . Marital Status: Single    Spouse Name: N/A    Number of Children: N/A  . Years of Education: N/A   Social History Main Topics  . Smoking status: Never Smoker   . Smokeless tobacco: Never Used  . Alcohol Use: No  . Drug Use: 7 per week    Special: Marijuana     04/18/12 "haven't smoked any in 3 wks; trying to get to pain management"  . Sexually Active: Not Currently   Other Topics Concern  . None  Social History Narrative  . None    Family History: Family History  Problem Relation Age of Onset  . Diabetes Other   . Hypertension Other   . Heart disease Other     Allergies: No Known Allergies  Current Facility-Administered Medications  Medication Dose Route Frequency Provider Last Rate Last Dose  . dextrose 5 %-0.9 % sodium chloride infusion   Intravenous Continuous Hoy Morn, MD 50 mL/hr at 08/20/12 0606    . dextrose 50 % solution 50 mL  1 ampule Intravenous Once Hoy Morn, MD   50 mL at 08/20/12 0606  . dextrose 50 % solution           . piperacillin-tazobactam  (ZOSYN) IVPB 3.375 g  3.375 g Intravenous Once Hoy Morn, MD      . vancomycin (VANCOCIN) IVPB 1000 mg/200 mL premix  1,000 mg Intravenous Once Hoy Morn, MD       Current Outpatient Prescriptions  Medication Sig Dispense Refill  . aspirin 81 MG EC tablet Take 81 mg by mouth daily.        . calcium acetate (PHOSLO) 667 MG capsule Take 1 capsule (667 mg total) by mouth 3 (three) times daily with meals.  90 capsule  0  . cloNIDine (CATAPRES) 0.1 MG tablet Take 1 tablet (0.1 mg total) by mouth 2 (two) times daily.  90 tablet  3  . insulin aspart (NOVOLOG FLEXPEN) 100 UNIT/ML injection Inject 2-10 Units into the skin 3 (three) times daily. Sliding scale  5 pen  3  . insulin detemir (LEVEMIR) 100 UNIT/ML injection Inject 25 Units into the skin every morning.  10 mL  12  . labetalol (NORMODYNE) 200 MG tablet Take 1 tablet (200 mg total) by mouth 2 (two) times daily.  60 tablet  2  . losartan (COZAAR) 100 MG tablet Take 1 tablet (100 mg total) by mouth at bedtime.  30 tablet  2  . oxyCODONE-acetaminophen (ROXICET) 5-325 MG per tablet Take 1 tablet by mouth every 8 (eight) hours as needed for pain.  15 tablet  0  . pantoprazole (PROTONIX) 40 MG tablet Take 1 tablet (40 mg total) by mouth 2 (two) times daily.  60 tablet  2  . sertraline (ZOLOFT) 50 MG tablet Take 50 mg by mouth daily.      [name] glucagon 1 MG injection Inject 1 mg into the vein once as needed. For low blood sugar      . metoCLOPramide (REGLAN) 10 MG tablet Take 1 tablet (10 mg total) by mouth 4 (four) times daily.  120 tablet  2  . ondansetron (ZOFRAN) 4 MG tablet Take 1 tablet (4 mg total) by mouth every 8 (eight) hours as needed. For nausea  20 tablet  2  . promethazine (PHENERGAN) 12.5 MG tablet Take 12.5 mg by mouth every 6 (six) hours as needed. For nausea       Review Of Systems: Per HPI with the following additions: See above otherwise negative. Otherwise 12 point review of systems was performed and was  unremarkable.  Physical Exam: Pulse: 77  Blood Pressure: 181/105 RR: 18   O2: 98 on RA Temp: 97.1  General: alert, cooperative, appears stated age and no distress HEENT: PERRLA, extra ocular movement intact and sclera clear, anicteric Heart: S1, S2 normal, no murmur, rub or gallop, regular rate and rhythm Lungs: clear to auscultation, no wheezes or rales and unlabored breathing Abdomen: abdomen is soft without significant tenderness, masses, organomegaly  or guarding Extremities: bilateral BKA. right stump with callus distally. left stump has anterior tibial callus-no erythema or abscess. Skin:no rashes Neurology: normal without focal findings, mental status, speech normal, alert and oriented x3, PERLA, muscle tone and strength normal and symmetric and sensation grossly normal  Labs and Imaging: Lab Results  Component Value Date/Time   NA 138 08/20/2012  5:52 AM   K 4.1 08/20/2012  5:52 AM   CL 95* 08/20/2012  5:52 AM   CO2 24 08/20/2012  5:52 AM   BUN 42* 08/20/2012  5:52 AM   CREATININE 9.52* 08/20/2012  5:52 AM   GLUCOSE 52* 08/20/2012  5:52 AM   Lab Results  Component Value Date   WBC 8.5 08/20/2012   HGB 12.9* 08/20/2012   HCT 39.4 08/20/2012   MCV 92.7 08/20/2012   PLT 249 08/20/2012   Results for orders placed during the hospital encounter of 08/20/12 (from the past 24 hour(s))  GLUCOSE, CAPILLARY     Status: Abnormal   Collection Time   08/20/12  5:15 AM      Component Value Range   Glucose-Capillary 55 (*) 70 - 99 mg/dL  CBC WITH DIFFERENTIAL     Status: Abnormal   Collection Time   08/20/12  5:52 AM      Component Value Range   WBC 8.5  4.0 - 10.5 K/uL   RBC 4.25  4.22 - 5.81 MIL/uL   Hemoglobin 12.9 (*) 13.0 - 17.0 g/dL   HCT 39.4  39.0 - 52.0 %   MCV 92.7  78.0 - 100.0 fL   MCH 30.4  26.0 - 34.0 pg   MCHC 32.7  30.0 - 36.0 g/dL   RDW 13.6  11.5 - 15.5 %   Platelets 249  150 - 400 K/uL   Neutrophils Relative 48  43 - 77 %   Neutro Abs 4.1  1.7 - 7.7 K/uL   Lymphocytes  Relative 34  12 - 46 %   Lymphs Abs 2.9  0.7 - 4.0 K/uL   Monocytes Relative 9  3 - 12 %   Monocytes Absolute 0.8  0.1 - 1.0 K/uL   Eosinophils Relative 8 (*) 0 - 5 %   Eosinophils Absolute 0.7  0.0 - 0.7 K/uL   Basophils Relative 2 (*) 0 - 1 %   Basophils Absolute 0.1  0.0 - 0.1 K/uL  BASIC METABOLIC PANEL     Status: Abnormal   Collection Time   08/20/12  5:52 AM      Component Value Range   Sodium 138  135 - 145 mEq/L   Potassium 4.1  3.5 - 5.1 mEq/L   Chloride 95 (*) 96 - 112 mEq/L   CO2 24  19 - 32 mEq/L   Glucose, Bld 52 (*) 70 - 99 mg/dL   BUN 42 (*) 6 - 23 mg/dL   Creatinine, Ser 9.52 (*) 0.50 - 1.35 mg/dL   Calcium 9.7  8.4 - 10.5 mg/dL   GFR calc non Af Amer 6 (*) >90 mL/min   GFR calc Af Amer 7 (*) >90 mL/min  PROCALCITONIN     Status: Normal   Collection Time   08/20/12  5:52 AM      Component Value Range   Procalcitonin 2.35    GLUCOSE, CAPILLARY     Status: Abnormal   Collection Time   08/20/12  7:49 AM      Component Value Range   Glucose-Capillary 160 (*) 70 - 99 mg/dL  Imaging:  CXR: IMPRESSION:  Hypoaeration. No focal consolidation.   Assessment and Plan: FILLIP BOYADJIAN is a 36 y.o. year old male with type I DM, ESRD, presumed gastroparesis presenting with severe hypoglycemia and hypothermia.   1. Hypoglycemia/T1DM. Most likely iatrogenic with small increase in levimir dose 2 days prior to the event, complicated by decreased metabolism with renal failure and possibly exacerbated by gastroparesis and slowed digestion. History of being brittle. Less likely a sepsis, but cultures have been drawn and patient on empiric van/zosyn currently. CBG is 160 currently, will continue q 1-2 hour CBGs. Pt is hungry, will start renal diet. Will start sensitive sliding scale with meals if blood sugar remains above 150 this morning. Will hold on starting any long acting insulin until this evening if uncontrolled, would start at 10 units. Overall, DM is uncontrolled with A1c  12.6 so this will be difficult to manage.   2. Hypothermia. Most likely autonomic dysfunction related to severe hypoglycemia. Less likely sepsis. PCT elevated, but not inconclusive with renal failure. F/u blood cultures and continue IV abx for now. Bear-hugger until he can maintain temp. Monitor in SDU.   3. Hypertension. Chronically uncontrolled in 180/110 range. Elevated in this range without home meds on board. He was started on low dose clonidine 3 days ago, and states his control was improved at HD on Saturday. Will restart home medications.  4. Presumed gastroparesis. Improved. Phenergan prn, continue home reglan with meals. Had outpatient gastric emptying study planned.  5. ESRD. Patient has received excess fluids during treatment. Will consult nephrology in case he may need HD sooner than normal schedule TRS.  6. FEN/GI: will decrease IV D5 if he maintains CBG >100. F/u BMET, Phos, Mag this afternoon, daily thereafter.  7. Prophylaxis: PPI daily home med. Heparin SQ.  8. Disposition: monitor in SDU until CBG and temperature is stable.   [name], MD [facility] Resident - PGY-3 08/20/2012 9:17 AM

## 2012-08-20 NOTE — Progress Notes (Signed)
Pt refuses to have bed alarm in place at this time. Pt in a camera room. States he understands why we use them, but doesn't feel like he needs to have it on. Will continue to monitor. C.Jerime Arif, RN.

## 2012-08-20 NOTE — ED Notes (Signed)
Pt given snack and drink. Resting quietly at the time. bair hugger remains in place. Vital signs stable. Remains on cardiac monitor.

## 2012-08-21 ENCOUNTER — Inpatient Hospital Stay (HOSPITAL_COMMUNITY): Payer: Medicare Other

## 2012-08-21 LAB — BASIC METABOLIC PANEL
CO2: 23 mEq/L (ref 19–32)
Chloride: 94 mEq/L — ABNORMAL LOW (ref 96–112)
Creatinine, Ser: 11.87 mg/dL — ABNORMAL HIGH (ref 0.50–1.35)
GFR calc Af Amer: 6 mL/min — ABNORMAL LOW (ref >90)
Potassium: 4.4 mEq/L (ref 3.5–5.1)
Sodium: 136 mEq/L (ref 135–145)

## 2012-08-21 LAB — CBC
HCT: 30.9 % — ABNORMAL LOW (ref 39.0–52.0)
RBC: 3.38 MIL/uL — ABNORMAL LOW (ref 4.22–5.81)
RDW: 13.6 % (ref 11.5–15.5)
WBC: 6.7 10*3/uL (ref 4.0–10.5)

## 2012-08-21 LAB — GLUCOSE, CAPILLARY
Glucose-Capillary: 300 mg/dL — ABNORMAL HIGH (ref 70–99)
Glucose-Capillary: 195 mg/dL — ABNORMAL HIGH (ref 70–99)
Glucose-Capillary: 145 mg/dL — ABNORMAL HIGH (ref 70–99)
Glucose-Capillary: 169 mg/dL — ABNORMAL HIGH (ref 70–99)

## 2012-08-21 MED ORDER — ONDANSETRON HCL 4 MG/2ML IJ SOLN
INTRAMUSCULAR | Status: AC
  Administered 2012-08-21: 4 mg via INTRAVENOUS
  Filled 2012-08-21: qty 2

## 2012-08-21 MED ORDER — PARICALCITOL 5 MCG/ML IV SOLN
INTRAVENOUS | Status: AC
  Administered 2012-08-21: 2 ug via INTRAVENOUS
  Filled 2012-08-21: qty 1

## 2012-08-21 MED ORDER — ONDANSETRON HCL 4 MG/2ML IJ SOLN
4.0000 mg | Freq: Once | INTRAMUSCULAR | Status: AC
  Administered 2012-08-21: 4 mg via INTRAVENOUS

## 2012-08-21 MED ORDER — CALCIUM ACETATE 667 MG PO CAPS
2001.0000 mg | ORAL_CAPSULE | Freq: Three times a day (TID) | ORAL | Status: DC
  Administered 2012-08-21 – 2012-08-22 (×4): 2001 mg via ORAL
  Filled 2012-08-21 (×6): qty 3

## 2012-08-21 MED ORDER — INSULIN GLARGINE 100 UNIT/ML ~~LOC~~ SOLN
15.0000 [IU] | Freq: Every day | SUBCUTANEOUS | Status: DC
  Administered 2012-08-21: 15 [IU] via SUBCUTANEOUS

## 2012-08-21 MED ORDER — ENOXAPARIN SODIUM 30 MG/0.3ML ~~LOC~~ SOLN
30.0000 mg | SUBCUTANEOUS | Status: DC
  Filled 2012-08-21 (×2): qty 0.3

## 2012-08-21 NOTE — Progress Notes (Signed)
Received CM consult for DME, however must have DME order for wheelchair with specific information such as nontip, elevated leg rest and order for J Basic Cushion. Met with pt who states that previous wheelchair was from [facility]. Have notifed [facility] and pt is eligible for a replacement wheelchair. MD notified of need for orders.      CARE MANAGEMENT NOTE 08/21/2012  Patient:  [name], [name]   Account Number:  [id number]  Date Initiated:  08/21/2012  Documentation initiated by:  Yurani Fettes  Subjective/Objective Assessment:   Request for wheelchair for pt     Action/Plan:   Pt is bil amputee and wheelchair is > 53yr old and worn per pt report. [facility] will provide a new wheelchair, MD notified for DME order and order for J-Basic Cushion.   Anticipated DC Date:  08/23/2012   Anticipated DC Plan:  [place]         [facility] Choice  DURABLE MEDICAL EQUIPMENT   Choice offered to / List presented to:  [name]   DME arranged  [place]      DME agency  [place].        Status of service:  In process, will continue to follow Medicare Important Message given?   (If response is "NO", the following Medicare IM given date fields will be blank) Date Medicare IM given:   Date Additional Medicare IM given:    Discharge Disposition:  [place]  Per UR Regulation:    If discussed at Long Length of Stay Meetings, dates discussed:    Comments:

## 2012-08-21 NOTE — Discharge Summary (Signed)
Physician Discharge Summary  Patient ID: [name] MRN: [id number] DOB/AGE: [birth year] 38 y.o.  Admit date: 08/20/2012 Discharge date: 08/22/2012  Admission Diagnoses: Hypoglycemia, hypothermia  Discharge Diagnoses:  Principal Problem:  *Hypoglycemia Active Problems:  DIABETES MELLITUS, TYPE I, UNCONTROLLED, WITH COMPLICATIONS  HYPERLIPIDEMIA  DEPRESSION, MAJOR, RECURRENT  HYPERTENSION, BENIGN SYSTEMIC  ESRD (end stage renal disease)  Hypothermia   Discharged Condition: improved  Hospital Course: The patient is a 36 yo male with a history of type 1 DM, s/p bilateral BKA, ESRD, presumed gastroperesis who presented with hypoglycemia and hypothermia. He was found to have a CBG of 24 by EMS in the early morning of 8/26 and a temp of 97.1 and was brought to the ED. He was admitted to rule out sepsis given his hypothermia and hypoglycemia. He was started on vancomycin and zosyn and his insulin was held until the evening of 8/26. He was given 10 U of lantus in the evening on 8/26 (had been on 25 U of levemir previously at home, which has been recently increased from 20 U).  His hypoglycemia resolved, with CBGs ranging from 150-300 on 8/26-27 and from 145-361 on 8/27-28. He was started on 10U of lantus on 8/26, which was titrated up to 15 U on 8/27 and tolerated the medicine well. He will be discharged on 20 U of levemir daily, with his previous sensitive sliding scale regimen. The patient's blood cultures remained no growth at 48 hours, so antibiotics were discontinued and the patient was discharged with instructions for reasons to return discussed.  The patient also expressed concern because he does not currently have a working wheelchair. Case management assisted with getting the patient a new wheelchair during this admission. His hypertension was treated with his home meds (no changes). He received HD on Tuesday 8/27 for his ESRD (usual HD schedule is T, H, Sa). His home dose of  zoloft was continued. [name] is to follow-up in the family medicine clinic on 9/4.   Treatments:  Hemodialysis  Discharge Exam: Blood pressure 128/74, pulse 77, temperature 98.4 F (36.9 C), temperature source Oral, resp. rate 18, height 6' (1.829 m), weight 152 lb 5.4 oz (69.1 kg), SpO2 100.00%.  Physical exam: Constitutional: He is oriented to person, place, and time. No distress.  HENT:  Head: Normocephalic.  Eyes: Conjunctivae and EOM are normal. No scleral icterus.  Neck: Neck supple.  Cardiovascular: Normal rate, regular rhythm and normal heart sounds.  Pulmonary/Chest: Effort normal and breath sounds normal.  Abdominal: Soft. Bowel sounds are normal. He exhibits no distension.  Musculoskeletal:  Bilateral btk amputations. No erythema or discharge from calluses on bilateral stumps.  Lymphadenopathy:  He has no cervical adenopathy.  Neurological: He is alert and oriented to person, place, and time. No cranial nerve deficit.  Skin: Skin is warm and dry.   Disposition: 01-Home or Self Care  Discharge Orders    Future Appointments: Provider: Department: Dept Phone: Center:   08/29/2012 8:00 AM [facility] [phone] [facility]   09/05/2012 2:30 PM Clovis Cao, MD [facility] Resident [phone] [facility]   09/21/2012 10:30 AM Clovis Cao, MD [facility] Resident [phone] [facility]     Future Orders Please Complete By Expires   Diet - low sodium heart healthy      Increase activity slowly        Medication List  As of 08/22/2012  2:18 PM   TAKE these medications         aspirin  81 MG EC tablet   Take 81 mg by mouth daily.      calcium acetate 667 MG capsule   Commonly known as: PHOSLO   Take 2,001 mg by mouth 3 (three) times daily with meals.      cloNIDine 0.1 MG tablet   Commonly known as: CATAPRES   Take 1 tablet (0.1 mg total) by mouth 2 (two) times daily.      glucagon 1 MG injection   Inject 1 mg into the vein once as needed. For low blood  sugar      insulin aspart 100 UNIT/ML injection   Commonly known as: novoLOG   Inject 2-10 Units into the skin 3 (three) times daily. Sliding scale      insulin detemir 100 UNIT/ML injection   Commonly known as: LEVEMIR   Inject 20 Units into the skin every morning.      labetalol 200 MG tablet   Commonly known as: NORMODYNE   Take 1 tablet (200 mg total) by mouth 2 (two) times daily.      losartan 100 MG tablet   Commonly known as: COZAAR   Take 1 tablet (100 mg total) by mouth at bedtime.      metoCLOPramide 10 MG tablet   Commonly known as: REGLAN   Take 1 tablet (10 mg total) by mouth 4 (four) times daily.      ondansetron 4 MG tablet   Commonly known as: ZOFRAN   Take 1 tablet (4 mg total) by mouth every 8 (eight) hours as needed. For nausea      oxyCODONE-acetaminophen 5-325 MG per tablet   Commonly known as: PERCOCET/ROXICET   Take 1 tablet by mouth every 8 (eight) hours as needed for pain.      pantoprazole 40 MG tablet   Commonly known as: PROTONIX   Take 1 tablet (40 mg total) by mouth 2 (two) times daily.      promethazine 12.5 MG tablet   Commonly known as: PHENERGAN   Take 12.5 mg by mouth every 6 (six) hours as needed. For nausea      sertraline 50 MG tablet   Commonly known as: ZOLOFT   Take 50 mg by mouth daily.           Follow-up Information    Follow up with Andrew Abed, MD on 09/05/2012. (at 2:30pm)    Contact information:   [place] [phone]        Follow-up issues: 1. Patient's home blood sugars. 2. Possible increase in levemir dose. 3. Ask patient about fevers/chill/infection. 4. Follow up final blood culture results.  SignedLorin Glass,  08/22/2012, 2:18 PM

## 2012-08-21 NOTE — Progress Notes (Signed)
Patient ID: [name], male   DOB: [birth year], 36 y.o.   MRN: [id number] [name] is a 36 y.o. male patient.  Subj: Pt did well overnight. His blood glucose ranged from the 200's-300 overnight. He was at dialysis this morning. He complains of some dizziness and "feeling weird". He also endorses some mild abdominal pain. He is asking for help with getting a new wheelchair.  No Known Allergies Active Problems:  * No active hospital problems. *    Review of Systems  Constitutional: Negative for fever and chills.  Eyes: Negative for blurred vision and double vision.  Respiratory: Negative for cough and shortness of breath.   Cardiovascular: Negative for chest pain, palpitations and leg swelling.  Gastrointestinal: Positive for abdominal pain. Negative for heartburn, nausea, vomiting and diarrhea.  Genitourinary: Negative for dysuria.  Skin: Negative for rash.  Neurological: Positive for dizziness. Negative for focal weakness, weakness and headaches.    Vitals: Blood pressure 138/87, pulse 82, temperature 98 F (36.7 C), temperature source Oral, resp. rate 18, height 6' (1.829 m), weight 157 lb 3 oz (71.3 kg), SpO2 98.00%.  Physical Exam  Constitutional: He is oriented to person, place, and time. No distress.  HENT:  Head: Normocephalic.  Eyes: Conjunctivae and EOM are normal. No scleral icterus.  Neck: Neck supple.  Cardiovascular: Normal rate, regular rhythm and normal heart sounds.   Pulmonary/Chest: Effort normal and breath sounds normal.  Abdominal: Soft. Bowel sounds are normal. He exhibits no distension.  Musculoskeletal:       Bilateral btk amputations. No erythema or discharge from calluses on bilateral stumps.  Lymphadenopathy:    He has no cervical adenopathy.  Neurological: He is alert and oriented to person, place, and time. No cranial nerve deficit.  Skin: Skin is warm and dry.    Labs: CBC remarkable for Hgb of 10.0 (from 12.9 yesterday). BMP  pending CBG 195 this morning  Problem list: 1. Hypoglycemia   2. Hypothermia   3. ESRD (end stage renal disease)   4. Essential hypertension, benign     Assessment and plan: 36yo male who presented to the ED yesterday with hypoglycemia and hypothermia. 1. Hypoglycemia-This problem appears to be resolved. His CBGs over the past 18 hours have ranged from 153-300. This morning, his CBG was 195. He received 10 units of lantus last night. We will plan to discharge him on lantus (dose to be determined) for better glucose control when he returns home. 2. Hypothermia-This is also resolved. We will follow blood and urine cultures, but he is asymptomatic and normothermic currently, so suspicion for infection is low. 3. ESRD-on a Tues, Thurs, Sat dialysis schedule. Getting HD today. 4. Hypertension-continue home meds. 5. Anemia-chronic anemia, will check CBC daily 6. Prophylaxis: continue home PPI for GI, heparin sq  7. Wheelchair-will discuss with case management to get him a wheelchair at home.  Betsey Holiday [facility] 08/21/2012   UPPER LEVEL ADDENDUM  I have seen and examined [name] with Betsey Holiday, MS4 and I agree with the above assessment/plan. I have reviewed all available data and have made any necessary changes to the above physical exam, assessment, and plan.  Briefly, [name] is feeling ok this morning.  He does have some dizziness and jitteriness this morning.  He is in dialysis, but states this is not something he usually experiences.  They checked his CBG which showed a glucose of 195.  He otherwise feels well, without CP, SOB, N/V, or pain.  No numbness, tingling, or weakness.  Patient also requesting a wheel chair at home; states he has to crawl around his house on his hands and has not had a WC in at least 10 years.   PE:  Gen: NAD, laying in dialysis  CV: RRR, no murmur Pulm: clear Abd: Soft, NT Ext: warm, bilateral BKAs Neuro: A&Ox3, CN 2-12 grossly intacts,  BUE strength 5/5, sensation intact in ext x4  A/P: 36 yo M w/ type 1DM and ESRD presented with hypoglycemia  - CBGs over the past 12 hours are improved from admission (50's)-- all >150; 195 this AM in dialysis when pt was ?symptomatic (mild dizziness, jittery) - Currently on his T-R-Sat HD schedule, appreciate Renal's assistance; receiving HD this AM - S/p Lantus 10U last night and maintaining CBGs; tolerating sensitive SSI, consider increasing lantus to 12-15 units tonight if majority of CBGs remain >175-200 - Patient reports eating and drinking well - Pt requesting wheel chair, will put in CM consult for [facility] equipment - Hgb did drop from 12 on admission to 10 this AM, will recheck CBC tomorrow AM to ensure it does not continue to fall, although his baseline is 10-11 so I assume he was hemo-concentrated on admission and has just returned to his baseline anemia (low 2/2 anemia of chronic disease) - F/u blood cultures, if negative at 48 hr will d/c Vanc/Zosyn and monitor CBGs and temp (was hypothermic on initial presentation); WBC was normal (8.5) on admission and remains 6.7 today; unable to collect urine culture as pt is anuric    Silvino Selman 08/21/2012, 9:26 AM

## 2012-08-21 NOTE — Progress Notes (Signed)
FMTS Attending Daily Note: Dorcas Mcmurray MD [phone] pager office [phone] I  have seen and examined this patient, reviewed their chart. I have discussed this patient with the resident. I agree with the resident's findings, assessment and care plan. I think we need to continue empiric antibiotic coverage until cultures are negative x 48 hours. His temp is normal and sugars have rebounded.

## 2012-08-21 NOTE — Procedures (Signed)
I was present at this dialysis session. I have reviewed the session itself and made appropriate changes.   Kelly Splinter, MD [employer] 08/21/2012, 9:49 AM

## 2012-08-21 NOTE — Progress Notes (Signed)
Subjective: feeling better, ate OK last night, no more low bs episodes  Objective Vital signs in last 24 hours: Filed Vitals:   08/21/12 0800 08/21/12 0830 08/21/12 0900 08/21/12 0929  BP: 169/101 138/87 142/88 114/67  Pulse: 80 82 80 79  Temp:      TempSrc:      Resp: 16 18 20 19   Height:      Weight:      SpO2:       Weight change:   Intake/Output Summary (Last 24 hours) at 08/21/12 0937 Last data filed at 08/20/12 2010  Gross per 24 hour  Intake      0 ml  Output    200 ml  Net   -200 ml   Labs: Basic Metabolic Panel:  Lab [id number] 0500 08/20/12 1235 08/20/12 0552  NA 136 136 138  K 4.4 4.4 4.1  CL 94* 94* 95*  CO2 23 25 24   GLUCOSE 275* 216* 52*  BUN 55* 46* 42*  CREATININE 11.87* 9.91* 9.52*  ALB -- -- --  CALCIUM 9.0 8.8 9.7  PHOS -- 5.3* --   Liver Function Tests:  Lab 08/20/12 1235  AST 16  ALT 16  ALKPHOS 135*  BILITOT 0.2*  PROT 6.6  ALBUMIN 2.9*   No results found for this basename: LIPASE:3,AMYLASE:3 in the last 168 hours No results found for this basename: AMMONIA:3 in the last 168 hours CBC:  Lab 08/21/12 0500 08/20/12 0552  WBC 6.7 8.5  NEUTROABS -- 4.1  HGB 10.0* 12.9*  HCT 30.9* 39.4  MCV 91.4 92.7  PLT 237 249   PT/INR: @labrcntip (inr:5) Cardiac Enzymes: No results found for this basename: CKTOTAL:5,CKMB:5,CKMBINDEX:5,TROPONINI:5 in the last 168 hours CBG:  Lab 08/21/12 0849 08/21/12 0202 08/20/12 2253 08/20/12 1709 08/20/12 1320  GLUCAP 195* 300* 279* 153* 187*    Iron Studies: No results found for this basename: IRON:30,TIBC:30,TRANSFERRIN:30,FERRITIN:30 in the last 168 hours  Physical Exam:  Blood pressure 114/67, pulse 79, temperature 98 F (36.7 C), temperature source Oral, resp. rate 19, height 6' (1.829 m), weight 71.3 kg (157 lb 3 oz), SpO2 98.00%.  General: adult bm , nad , thin chronically il , cooperative  HEENT: [place], mmm, poor dental hygene  Eyes: eomi  Neck: No jvd  Heart: RRR, 2/6 sem at apex, no rub    Lungs: cta bilaterally  Abdomen: bs pos. Soft ,nontender  Extremities: bilat. bkas no edema  Skin: bilat . Hardened skin ulcers on biltaleral bkas,and right patella superior aspect.  Neuro: ox3, moves all extrem  Dialysis Access: pos. Bruit left upper arm avgg    Dialysis Orders: Center: [place] on TTS .  EDW 65.5 HD Bath 2k , ca 2.5 Time 4.0 Heparin 3.83ml. 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

## 2012-08-22 DIAGNOSIS — T68XXXA Hypothermia, initial encounter: Secondary | ICD-10-CM | POA: Diagnosis present

## 2012-08-22 DIAGNOSIS — E162 Hypoglycemia, unspecified: Secondary | ICD-10-CM | POA: Diagnosis present

## 2012-08-22 LAB — CBC
Hemoglobin: 10.5 g/dL — ABNORMAL LOW (ref 13.0–17.0)
MCH: 30.1 pg (ref 26.0–34.0)
MCHC: 32.2 g/dL (ref 30.0–36.0)
Platelets: 224 10*3/uL (ref 150–400)

## 2012-08-22 MED ORDER — INSULIN DETEMIR 100 UNIT/ML ~~LOC~~ SOLN: 20.0000 [IU] | Freq: Every morning | SUBCUTANEOUS | Status: DC

## 2012-08-22 NOTE — Progress Notes (Signed)
   CARE MANAGEMENT NOTE 08/22/2012  Patient:  [name], [name]   Account Number:  [id number]  Date Initiated:  08/21/2012  Documentation initiated by:  ROYAL,CHERYL  Subjective/Objective Assessment:   Request for wheelchair for pt     Action/Plan:   Pt is bil amputee and wheelchair is > 58yr old and worn per pt report. [facility] will provide a new wheelchair, MD notified for DME order and order for J-Basic Cushion.   Anticipated DC Date:  08/23/2012   Anticipated DC Plan:  [place]         [facility] Choice  DURABLE MEDICAL EQUIPMENT   Choice offered to / List presented to:  [name]   DME arranged  [place]      DME agency  [place].        Status of service:  In process, will continue to follow Medicare Important Message given?   (If response is "NO", the following Medicare IM given date fields will be blank) Date Medicare IM given:   Date Additional Medicare IM given:    Discharge Disposition:  [place]  Per UR Regulation:    If discussed at Long Length of Stay Meetings, dates discussed:    Comments:  08/22/2012  [place], [place] Met with patient to discuss discharge planning. The manual wheelchair with cushion is in the room. He uses [employer] for getting to appointments.

## 2012-08-22 NOTE — Progress Notes (Signed)
FMTS Attending Daily Note: Dorcas Mcmurray MD [phone] pager office [phone] I  have seen and examined this patient, reviewed their chart. I have discussed this patient with the resident. I agree with the resident's findings, assessment and care plan. No sign of infection--seems to be feeling well. Hypothermia and hypoglycemia both resolved. D/c prophylactic abx and can d/c home. We were also able to help[ him get a new wheel chair.

## 2012-08-22 NOTE — Progress Notes (Signed)
Subjective: Interval History: Pt did well overnight and has no complaints this morning. He denies dizzyness, chest pain, difficulty breathing, abdominal pain. Feels ready for d/c.  Objective: Vital signs in last 24 hours: Temp:  [97.9 F (36.6 C)-99.1 F (37.3 C)] 98.4 F (36.9 C) (08/28 0440) Pulse Rate:  [79-89] 81  (08/28 0440) Resp:  [10-22] 17  (08/28 0440) BP: (93-189)/(51-101) 177/97 mmHg (08/28 0440) SpO2:  [98 %-100 %] 98 % (08/28 0440) Weight:  [151 lb 3.8 oz (68.6 kg)-157 lb 3 oz (71.3 kg)] 152 lb 5.4 oz (69.1 kg) (08/27 2153)  Intake/Output from previous day: 08/27 0701 - 08/28 0700 In: 34 [P.O.:360] Out: 3000 [Urine:250]  Physical Exam: Constitutional: He is oriented to person, place, and time. No distress.  HENT:  Head: Normocephalic.  Eyes: Conjunctivae and EOM are normal. No scleral icterus.  Neck: Neck supple.  Cardiovascular: Normal rate, regular rhythm and normal heart sounds.  Pulmonary/Chest: Effort normal and breath sounds normal.  Abdominal: Soft. Bowel sounds are normal. He exhibits no distension.  Musculoskeletal:  Bilateral btk amputations. No erythema or discharge from calluses on bilateral stumps.  Lymphadenopathy:  He has no cervical adenopathy.  Neurological: He is alert and oriented to person, place, and time. No cranial nerve deficit.  Skin: Skin is warm and dry.    Results for orders placed during the hospital encounter of 08/20/12 (from the past 24 hour(s))  HEPATITIS B SURFACE ANTIGEN     Status: Normal   Collection Time   08/21/12  8:30 AM      Component Value Range   Hepatitis B Surface Ag NEGATIVE  NEGATIVE  GLUCOSE, CAPILLARY     Status: Abnormal   Collection Time   08/21/12  8:49 AM      Component Value Range   Glucose-Capillary 195 (*) 70 - 99 mg/dL  GLUCOSE, CAPILLARY     Status: Abnormal   Collection Time   08/21/12 12:49 PM      Component Value Range   Glucose-Capillary 145 (*) 70 - 99 mg/dL  GLUCOSE, CAPILLARY     Status:  Abnormal   Collection Time   08/21/12  4:32 PM      Component Value Range   Glucose-Capillary 361 (*) 70 - 99 mg/dL  GLUCOSE, CAPILLARY     Status: Abnormal   Collection Time   08/21/12  9:34 PM      Component Value Range   Glucose-Capillary 169 (*) 70 - 99 mg/dL  GLUCOSE, CAPILLARY     Status: Abnormal   Collection Time   08/22/12  4:37 AM      Component Value Range   Glucose-Capillary 216 (*) 70 - 99 mg/dL  CBC     Status: Abnormal   Collection Time   08/22/12  6:05 AM      Component Value Range   WBC 6.6  4.0 - 10.5 K/uL   RBC 3.49 (*) 4.22 - 5.81 MIL/uL   Hemoglobin 10.5 (*) 13.0 - 17.0 g/dL   HCT 32.6 (*) 39.0 - 52.0 %   MCV 93.4  78.0 - 100.0 fL   MCH 30.1  26.0 - 34.0 pg   MCHC 32.2  30.0 - 36.0 g/dL   RDW 14.2  11.5 - 15.5 %   Platelets 224  150 - 400 K/uL      Scheduled Meds:   . aspirin EC  81 mg Oral Daily  . calcium acetate  2,001 mg Oral TID WC  . cloNIDine  0.1 mg  Oral BID  . darbepoetin (ARANESP) injection - DIALYSIS  60 mcg Intravenous Q Thu-HD  . enoxaparin (LOVENOX) injection  30 mg Subcutaneous Q24H  . insulin aspart  0-9 Units Subcutaneous TID WC  . insulin glargine  15 Units Subcutaneous QHS  . labetalol  200 mg Oral BID  . losartan  100 mg Oral QHS  . metoCLOPramide  10 mg Oral QID  . ondansetron (ZOFRAN) IV  4 mg Intravenous Once  . pantoprazole  40 mg Oral BID  . paricalcitol  2 mcg Intravenous Q T,Th,Sa-HD  . piperacillin-tazobactam (ZOSYN)  IV  2.25 g Intravenous Q8H  . sertraline  50 mg Oral Daily  . sodium chloride  3 mL Intravenous Q12H  . vancomycin  750 mg Intravenous Q T,Th,Sa-HD  . DISCONTD: calcium acetate  667 mg Oral TID WC  . DISCONTD: heparin  5,000 Units Subcutaneous Q8H  . DISCONTD: insulin glargine  10 Units Subcutaneous QHS   PRN Meds:acetaminophen, acetaminophen, HYDROcodone-acetaminophen, ondansetron, promethazine  Assessment/Plan: Patient is a 36 yo male with a history of DM type 1, ESRD, HTN who presented with  hypoglycemia and hypothermia. 1. Hypoglycemia-resolved. Pt was given 10 U of lantus on 8/26 in the evening and no episodes of hypoglycemia on 8/26-8/27. He was increased to 15 U of lantus on 8/27 PM because CGBs were in the 200-300's. Overnight, he had no episodes of hypoglycemia. Anticipate discharge today with 15 U of lantus daily and follow-up in clinic. 2. Hypothermia-treated with vanc/zosyn for possible sepsis. Blood cultures neg to date.  3. HTN-continued home meds. 4. Prophylaxis-lovenox sq 30mg  q day   LOS: 2 days   Betsey Holiday, MS4  UPPER LEVEL ADDENDUM  I have seen and examined [name] with Betsey Holiday, MS4 and I agree with the above assessment/plan. I have reviewed all available data and have made any necessary changes to the above physical exam, assessment, and plan. Briefly, [name] is feeling better this morning, now attributing jitteriness/dizziness yesterday to dialysis itself.  Feels well this morning with the exception of some mild upset stomach and diarrhea, which he thinks is from the antibiotics   PE:  Gen: NAD, sitting in chair CV: RRR, no murmur  Pulm: clear  Abd: Soft, NT  Ext: warm, bilateral BKAs   A/P: 36 yo M w/ type 1DM and ESRD presented with hypoglycemia  - CBGs are no longer low, this has resolved - Currently on his T-R-Sat HD schedule, appreciate Renal's assistance; received HD yesterday - S/p Lantus 15U last night, was increased from 10; CBGs remain elevated [id number] - Patient reports eating and drinking well  - Pt requesting wheel chair --> has been ordered for him - Hgb is stable at 10.5 - F/u blood cultures, if negative at 48 hr (later this morning) will d/c Vanc/Zosyn and monitor CBGs and temp (was hypothermic on initial presentation); WBC was normal (8.5) on admission and remains 6.6 today; unable to collect urine culture as pt is oliguric and has not been able to provide a sample as of yet  - Consider monitoring pt x24 hours  after abx d/c'ed vs sending home today  [name] 08/22/2012, 9:45 AM

## 2012-08-22 NOTE — Progress Notes (Signed)
Subjective:  Eating lunch Objective Vital signs in last 24 hours: Filed Vitals:   08/21/12 2153 08/21/12 2318 08/22/12 0440 08/22/12 1000  BP: 189/101 134/66 177/97 128/74  Pulse: 85 84 81 77  Temp: 99.1 F (37.3 C)  98.4 F (36.9 C) 98.4 F (36.9 C)  TempSrc: Oral  Oral Oral  Resp: 17  17 18   Height:      Weight: 69.1 kg (152 lb 5.4 oz)     SpO2: 99%  98% 100%   Weight change: -3.543 kg (-7 lb 13 oz)  Intake/Output Summary (Last 24 hours) at 08/22/12 1250 Last data filed at 08/22/12 0900  Gross per 24 hour  Intake    770 ml  Output    250 ml  Net    520 ml   Labs: Basic Metabolic Panel:  Lab [id number] 0500 08/20/12 1235 08/20/12 0552  NA 136 136 138  K 4.4 4.4 4.1  CL 94* 94* 95*  CO2 23 25 24   GLUCOSE 275* 216* 52*  BUN 55* 46* 42*  CREATININE 11.87* 9.91* 9.52*  CALCIUM 9.0 8.8 9.7  ALB -- -- --  PHOS -- 5.3* --   Liver Function Tests:  Lab 08/20/12 1235  AST 16  ALT 16  ALKPHOS 135*  BILITOT 0.2*  PROT 6.6  ALBUMIN 2.9*   No results found for this basename: LIPASE:3,AMYLASE:3 in the last 168 hours No results found for this basename: AMMONIA:3 in the last 168 hours CBC:  Lab 08/22/12 0605 08/21/12 0500 08/20/12 0552  WBC 6.6 6.7 8.5  NEUTROABS -- -- 4.1  HGB 10.5* 10.0* 12.9*  HCT 32.6* 30.9* 39.4  MCV 93.4 91.4 92.7  PLT 224 237 249   Cardiac Enzymes: No results found for this basename: CKTOTAL:5,CKMB:5,CKMBINDEX:5,TROPONINI:5 in the last 168 hours CBG:  Lab 08/22/12 1159 08/22/12 0734 08/22/12 0437 08/21/12 2134 08/21/12 1632  GLUCAP 239* 180* 216* 169* 361*    Iron Studies: No results found for this basename: IRON,TIBC,TRANSFERRIN,FERRITIN in the last 72 hours Studies/Results: No results found. Medications:      . aspirin EC  81 mg Oral Daily  . calcium acetate  2,001 mg Oral TID WC  . cloNIDine  0.1 mg Oral BID  . darbepoetin (ARANESP) injection - DIALYSIS  60 mcg Intravenous Q Thu-HD  . enoxaparin (LOVENOX) injection  30 mg  Subcutaneous Q24H  . insulin aspart  0-9 Units Subcutaneous TID WC  . insulin glargine  15 Units Subcutaneous QHS  . labetalol  200 mg Oral BID  . losartan  100 mg Oral QHS  . metoCLOPramide  10 mg Oral QID  . pantoprazole  40 mg Oral BID  . paricalcitol  2 mcg Intravenous Q T,Th,Sa-HD  . piperacillin-tazobactam (ZOSYN)  IV  2.25 g Intravenous Q8H  . sertraline  50 mg Oral Daily  . sodium chloride  3 mL Intravenous Q12H  . vancomycin  750 mg Intravenous Q T,Th,Sa-HD  . DISCONTD: heparin  5,000 Units Subcutaneous Q8H  . DISCONTD: insulin glargine  10 Units Subcutaneous QHS   I  have reviewed scheduled and prn medications.  Physical Exam: General: Alert, NAD,BM chronically il , cooperative  Heart: RRR, 2/6 sem at apex, no rub  Lungs: cta bilaterally  Abdomen: bs pos. Soft ,nontender  Extremities: bilat. bkas no edema  Dialysis Access: pos. Bruit left upper arm avgg   Dialysis Orders: Center: [place] on TTS .  EDW 65.5 HD Bath 2k , ca 2.5 Time 4.0 Heparin 3.41ml. 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 3 Anemia - 10.5 hgb continue epo as op / weekly venofer  4 HPTH - binders and 59mcg zemplar  5 Nutrition - high protein renal carbohydrate mod  6 Hypothermia reported= resolved / neg. bc 7 HTN, cont home meds  Ernest Haber, PA-C [place] [phone] 08/22/2012,12:50 PM  LOS: 2 days   Patient seen and examined and agree with assessment and plan as above.  Kelly Splinter  MD [employer] [phone] pgr    [phone] cell 08/22/2012, 3:42 PM

## 2012-08-22 NOTE — Discharge Summary (Signed)
Family Medicine Teaching Service  Discharge Note : Attending Abijah Roussel MD Pager [phone] Office [phone] I have seen and examined this patient, reviewed their chart and discussed discharge planning wit the resident at the time of discharge. I agree with the discharge plan as above.  

## 2012-08-22 NOTE — Progress Notes (Signed)
Pt. discharged to home. Pt after visit summary reviewed and pt capable of re verbalizing medications and follow up appointments. Pt remains stable. No signs and symptoms of distress. Educated to return to ER in the event of SOB, dizziness, chest pain, or fainting. Ambry Dix, RN  

## 2012-08-24 ENCOUNTER — Other Ambulatory Visit: Payer: Self-pay | Admitting: *Deleted

## 2012-08-24 MED ORDER — GLUCAGON (RDNA) 1 MG IJ KIT: 1.0000 mg | PACK | Freq: Once | INTRAMUSCULAR | Status: DC | PRN

## 2012-08-26 LAB — CULTURE, BLOOD (ROUTINE X 2): Culture: NO GROWTH

## 2012-08-29 ENCOUNTER — Telehealth: Payer: Self-pay | Admitting: Family Medicine

## 2012-08-29 ENCOUNTER — Encounter (HOSPITAL_BASED_OUTPATIENT_CLINIC_OR_DEPARTMENT_OTHER): Payer: Medicare Other

## 2012-08-29 ENCOUNTER — Telehealth: Payer: Self-pay | Admitting: *Deleted

## 2012-08-29 ENCOUNTER — Encounter (HOSPITAL_COMMUNITY)
Admission: RE | Admit: 2012-08-29 | Discharge: 2012-08-29 | Disposition: A | Discharge status: Home / Self Care | Payer: Medicare Other | Source: Ambulatory Visit | Attending: Family Medicine | Admitting: Family Medicine

## 2012-08-29 DIAGNOSIS — R112 Nausea with vomiting, unspecified: Secondary | ICD-10-CM | POA: Insufficient documentation

## 2012-08-29 MED ORDER — TECHNETIUM TC 99M SULFUR COLLOID
2.0000 | Freq: Once | INTRAVENOUS | Status: AC | PRN
  Administered 2012-08-29: 2 via INTRAVENOUS

## 2012-08-29 NOTE — Telephone Encounter (Signed)
Message copied by Teddy Spike on Wed Aug 29, 2012  4:40 PM ------      Message from: Clovis Cao      Created: Wed Aug 29, 2012  1:34 PM      Regarding: gastric study        Gastric emptying study shows gastroparesis-this is what we expected-slow emptying of food-related to his diabetes. Continue with the reglan. Follow up visit in next month.

## 2012-08-29 NOTE — Telephone Encounter (Signed)
Message copied by Jamal Collin on Wed Aug 29, 2012  4:34 PM ------      Message from: Clovis Cao      Created: Wed Aug 29, 2012  1:34 PM      Regarding: gastric study        Gastric emptying study shows gastroparesis-this is what we expected-slow emptying of food-related to his diabetes. Continue with the reglan. Follow up visit in next month.

## 2012-08-29 NOTE — Telephone Encounter (Signed)
Pt is advised of results of gastric emptying study and to continue reglan and F/U in 1 mo. Pt voiced understanding.

## 2012-08-29 NOTE — Telephone Encounter (Signed)
Called pt and informed him of the results. He stated that someone had already called him and informed him of this. Also pt has an appt on 9.27.2013. I informed him that he also had an appt on 09.11.2013 that he was not sure of so per patients request this appt was cancelled.[name]

## 2012-09-05 ENCOUNTER — Ambulatory Visit: Payer: Medicare Other | Admitting: Family Medicine

## 2012-09-05 IMAGING — CR DG CHEST 1V PORT
1 series · 1 of 1 positions shown · non-contrast
Comparison: 04/17/2012

CLINICAL DATA: Emesis, abdominal pain.

PORTABLE CHEST - 1 VIEW

[AP]
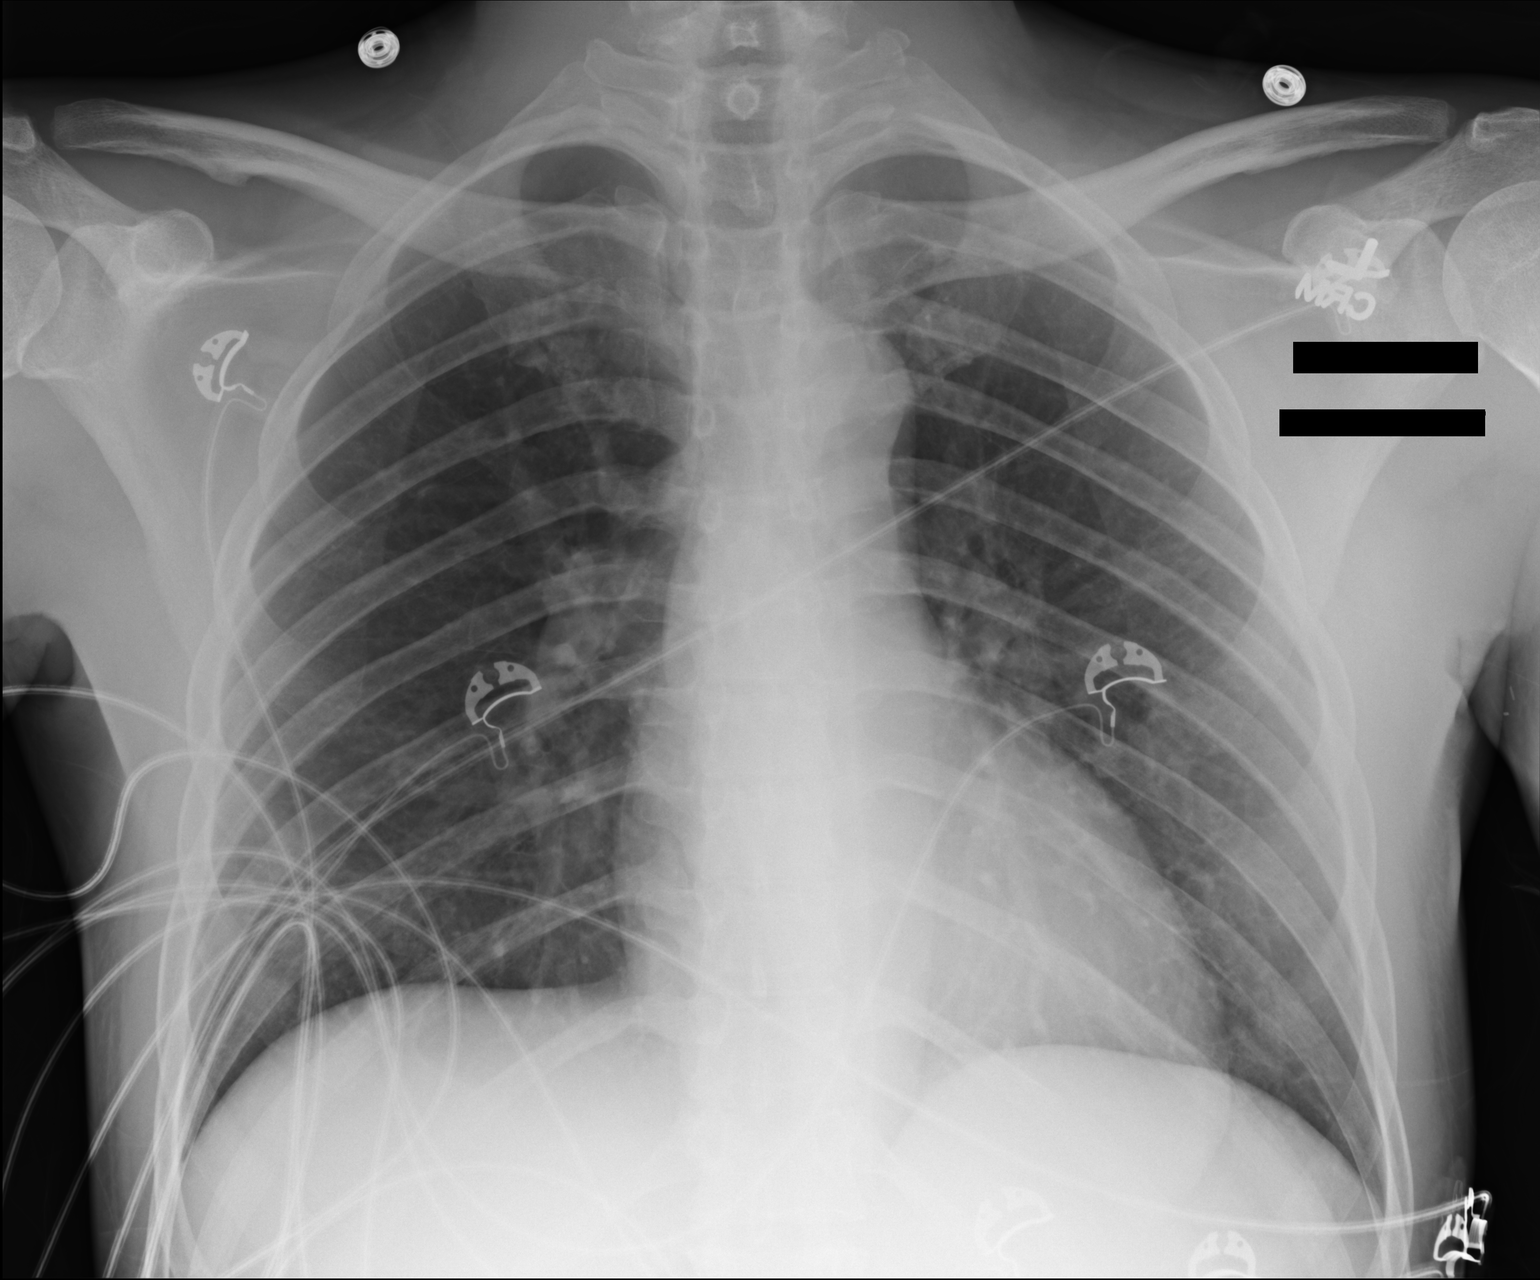

[1 of 1 positions shown; findings below may reference images not displayed]

FINDINGS: Lungs clear.  Heart size and pulmonary vascularity
normal.  No effusion.  Visualized bones unremarkable.
IMPRESSION: No acute disease

## 2012-09-17 IMAGING — CR DG CHEST 1V PORT
1 series · 1 of 1 positions shown · non-contrast
Comparison: 08/08/2012

CLINICAL DATA: Hypoglycemia, weakness.

PORTABLE CHEST - 1 VIEW

[AP]
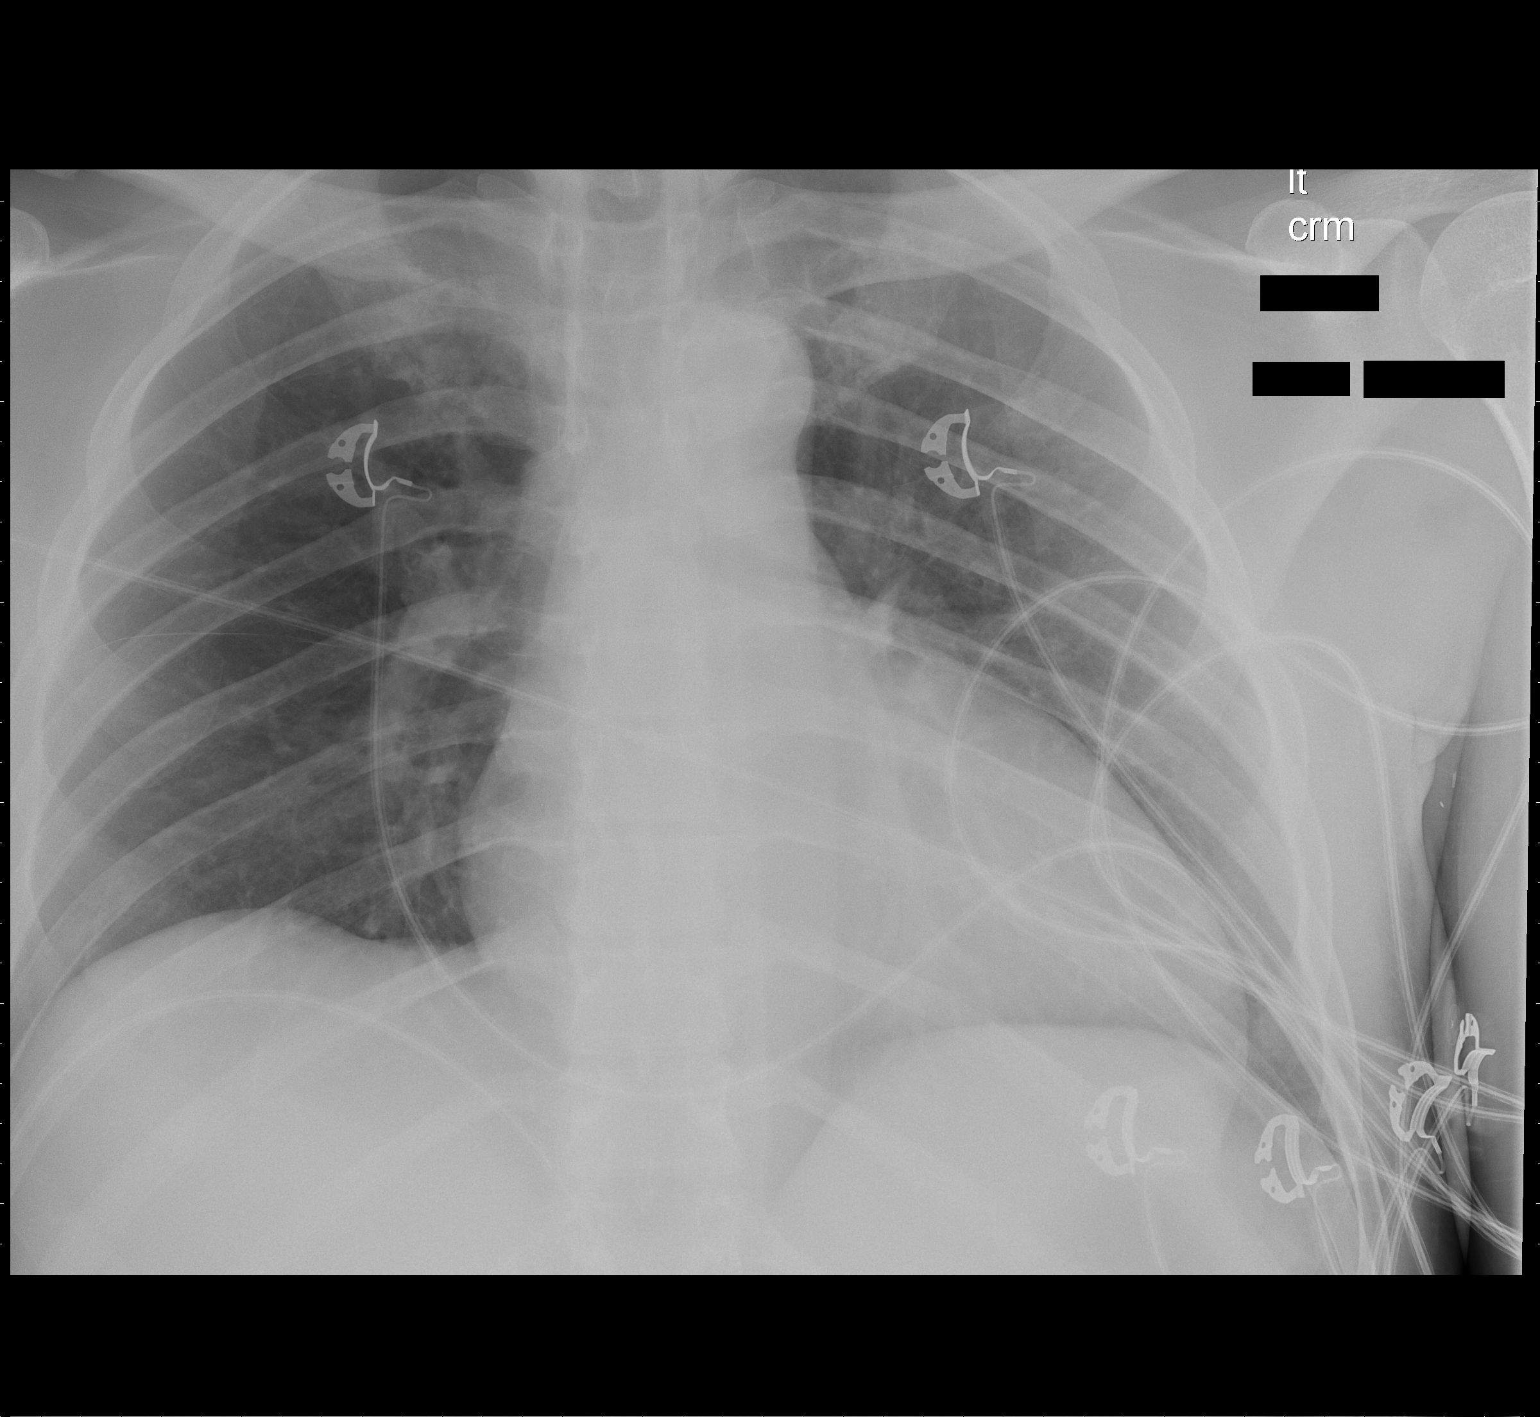

[1 of 1 positions shown; findings below may reference images not displayed]

FINDINGS: Hypoaeration results in interstitial vascular crowding.
Heart size upper normal, also may be accentuated by respiratory
effort/portable technique.  No pleural effusion or pneumothorax.
Mild right hemidiaphragm elevation with associated scarring.
Otherwise, no focal consolidation.  No acute osseous finding.
IMPRESSION: Hypoaeration.  No focal consolidation.

## 2012-09-21 ENCOUNTER — Ambulatory Visit: Payer: Medicare Other | Admitting: Family Medicine

## 2012-09-26 ENCOUNTER — Encounter (HOSPITAL_BASED_OUTPATIENT_CLINIC_OR_DEPARTMENT_OTHER): Payer: Medicare Other

## 2012-11-05 ENCOUNTER — Encounter: Payer: Self-pay | Admitting: Home Health Services

## 2012-11-07 ENCOUNTER — Encounter: Payer: Self-pay | Admitting: Home Health Services

## 2012-12-26 HISTORY — PX: COMBINED KIDNEY-PANCREAS TRANSPLANT: SHX1382

## 2013-02-20 ENCOUNTER — Other Ambulatory Visit: Payer: Self-pay | Admitting: *Deleted

## 2013-02-20 DIAGNOSIS — T82598A Other mechanical complication of other cardiac and vascular devices and implants, initial encounter: Secondary | ICD-10-CM

## 2013-02-26 ENCOUNTER — Encounter: Payer: Self-pay | Admitting: Vascular Surgery

## 2013-02-27 ENCOUNTER — Ambulatory Visit: Payer: Medicare Other | Admitting: Vascular Surgery

## 2013-03-05 ENCOUNTER — Emergency Department (HOSPITAL_COMMUNITY): Payer: Medicare Other

## 2013-03-05 ENCOUNTER — Emergency Department (HOSPITAL_COMMUNITY)
Admission: EM | Admit: 2013-03-05 | Discharge: 2013-03-05 | Disposition: A | Discharge status: Home / Self Care | Payer: Medicare Other | Attending: Emergency Medicine | Admitting: Emergency Medicine

## 2013-03-05 ENCOUNTER — Encounter (HOSPITAL_COMMUNITY): Payer: Self-pay | Admitting: *Deleted

## 2013-03-05 DIAGNOSIS — R011 Cardiac murmur, unspecified: Secondary | ICD-10-CM | POA: Insufficient documentation

## 2013-03-05 DIAGNOSIS — R079 Chest pain, unspecified: Secondary | ICD-10-CM | POA: Insufficient documentation

## 2013-03-05 DIAGNOSIS — G8929 Other chronic pain: Secondary | ICD-10-CM | POA: Insufficient documentation

## 2013-03-05 DIAGNOSIS — Z794 Long term (current) use of insulin: Secondary | ICD-10-CM | POA: Insufficient documentation

## 2013-03-05 DIAGNOSIS — F3289 Other specified depressive episodes: Secondary | ICD-10-CM | POA: Insufficient documentation

## 2013-03-05 DIAGNOSIS — Z79899 Other long term (current) drug therapy: Secondary | ICD-10-CM | POA: Insufficient documentation

## 2013-03-05 DIAGNOSIS — Z7982 Long term (current) use of aspirin: Secondary | ICD-10-CM | POA: Insufficient documentation

## 2013-03-05 DIAGNOSIS — R0602 Shortness of breath: Secondary | ICD-10-CM | POA: Insufficient documentation

## 2013-03-05 DIAGNOSIS — Z8719 Personal history of other diseases of the digestive system: Secondary | ICD-10-CM | POA: Insufficient documentation

## 2013-03-05 DIAGNOSIS — K219 Gastro-esophageal reflux disease without esophagitis: Secondary | ICD-10-CM | POA: Insufficient documentation

## 2013-03-05 DIAGNOSIS — I120 Hypertensive chronic kidney disease with stage 5 chronic kidney disease or end stage renal disease: Secondary | ICD-10-CM | POA: Insufficient documentation

## 2013-03-05 DIAGNOSIS — N186 End stage renal disease: Secondary | ICD-10-CM | POA: Insufficient documentation

## 2013-03-05 DIAGNOSIS — F1210 Cannabis abuse, uncomplicated: Secondary | ICD-10-CM | POA: Insufficient documentation

## 2013-03-05 DIAGNOSIS — R112 Nausea with vomiting, unspecified: Secondary | ICD-10-CM | POA: Insufficient documentation

## 2013-03-05 DIAGNOSIS — Z992 Dependence on renal dialysis: Secondary | ICD-10-CM | POA: Insufficient documentation

## 2013-03-05 DIAGNOSIS — J189 Pneumonia, unspecified organism: Secondary | ICD-10-CM | POA: Insufficient documentation

## 2013-03-05 DIAGNOSIS — R059 Cough, unspecified: Secondary | ICD-10-CM | POA: Insufficient documentation

## 2013-03-05 DIAGNOSIS — E109 Type 1 diabetes mellitus without complications: Secondary | ICD-10-CM | POA: Insufficient documentation

## 2013-03-05 DIAGNOSIS — M19049 Primary osteoarthritis, unspecified hand: Secondary | ICD-10-CM | POA: Insufficient documentation

## 2013-03-05 DIAGNOSIS — S88119A Complete traumatic amputation at level between knee and ankle, unspecified lower leg, initial encounter: Secondary | ICD-10-CM | POA: Insufficient documentation

## 2013-03-05 DIAGNOSIS — R42 Dizziness and giddiness: Secondary | ICD-10-CM | POA: Insufficient documentation

## 2013-03-05 LAB — BASIC METABOLIC PANEL
BUN: 29 mg/dL — ABNORMAL HIGH (ref 6–23)
Calcium: 9.1 mg/dL (ref 8.4–10.5)
GFR calc non Af Amer: 6 mL/min — ABNORMAL LOW (ref >90)
Glucose, Bld: 338 mg/dL — ABNORMAL HIGH (ref 70–99)

## 2013-03-05 LAB — CBC WITH DIFFERENTIAL/PLATELET
Eosinophils Absolute: 0.5 10*3/uL (ref 0.0–0.7)
Eosinophils Relative: 7 % — ABNORMAL HIGH (ref 0–5)
Hemoglobin: 11.3 g/dL — ABNORMAL LOW (ref 13.0–17.0)
Lymphs Abs: 1.2 10*3/uL (ref 0.7–4.0)
MCH: 30.7 pg (ref 26.0–34.0)
MCV: 87.8 fL (ref 78.0–100.0)
Monocytes Relative: 10 % (ref 3–12)
RBC: 3.68 MIL/uL — ABNORMAL LOW (ref 4.22–5.81)

## 2013-03-05 MED ORDER — AZITHROMYCIN 250 MG PO TABS: ORAL_TABLET | ORAL | Status: DC

## 2013-03-05 MED ORDER — ASPIRIN 325 MG PO TABS
325.0000 mg | ORAL_TABLET | Freq: Once | ORAL | Status: AC
  Administered 2013-03-05: 325 mg via ORAL
  Filled 2013-03-05: qty 1

## 2013-03-05 MED ORDER — MORPHINE SULFATE 4 MG/ML IJ SOLN: 4.0000 mg | Freq: Once | INTRAMUSCULAR | Status: DC

## 2013-03-05 MED ORDER — LABETALOL HCL 200 MG PO TABS
200.0000 mg | ORAL_TABLET | ORAL | Status: AC
  Administered 2013-03-05: 200 mg via ORAL
  Filled 2013-03-05 (×2): qty 1

## 2013-03-05 MED ORDER — NITROGLYCERIN 0.4 MG SL SUBL
0.4000 mg | SUBLINGUAL_TABLET | SUBLINGUAL | Status: DC | PRN
  Administered 2013-03-05: 0.4 mg via SUBLINGUAL
  Filled 2013-03-05: qty 25

## 2013-03-05 MED ORDER — IOHEXOL 350 MG/ML SOLN
100.0000 mL | Freq: Once | INTRAVENOUS | Status: AC | PRN
Start: 2013-03-05 — End: 2013-03-05
  Administered 2013-03-05: 100 mL via INTRAVENOUS

## 2013-03-05 NOTE — ED Notes (Signed)
EMT at [facility] holding direct pressure for hemorrhage control

## 2013-03-05 NOTE — ED Provider Notes (Signed)
I saw and evaluated the patient, reviewed the resident's note and I agree with the findings and plan.  Teh pt arrives to the ed with CP which started during dialysis and is worsened with palpation of the chest and with deep breathing.  There is clear lung sounds, 3/6 systolic murmur, no edema, no rales, normal lytes, renal dysfunction as expected as the pt did not finish dialysis.  No acute findings on CT scan other than possible pneumonitis - pt has no fever, minimal if any cough and pain in the L chest not c/w RLL findings on xray.  Pt stable for d/c.  CT r/o PE.  I have seen and interpreted the ECG and agree with the resident interpretation.  Johnna Acosta, MD 03/05/13 2142

## 2013-03-05 NOTE — ED Notes (Signed)
Applied pressure to pts arm after picc line removed from 1200-1225

## 2013-03-05 NOTE — ED Provider Notes (Signed)
History     CSN: [id number]  Arrival date & time 03/05/13  0810   First MD Initiated Contact with Patient 03/05/13 [phone]      Chief Complaint  Patient presents with  . Chest Pain    (Consider location/radiation/quality/duration/timing/severity/associated sxs/prior treatment) Patient is a 37 y.o. male presenting with chest pain. The history is provided by the patient. No language interpreter was used.  Chest Pain Pain location:  L chest Pain quality: sharp and stabbing   Pain radiates to:  Does not radiate Pain radiates to the back: no   Pain severity:  Severe Onset quality:  Unable to specify Duration:  1 day Timing:  Constant Progression:  Unchanged Context: at rest   Relieved by:  Rest Worsened by:  Deep breathing and coughing (mvmt) Ineffective treatments:  None tried Associated symptoms: cough, dizziness, nausea, shortness of breath and vomiting   Associated symptoms: no abdominal pain, no anxiety, no back pain, no diaphoresis, no fatigue, no fever, no headache, no numbness and no weakness   Associated symptoms comment:  Diarrhea Risk factors: diabetes mellitus, high cholesterol, hypertension and male sex   Risk factors: no aortic disease, no prior DVT/PE and no smoking     Past Medical History  Diagnosis Date  . Hypertension   . GERD (gastroesophageal reflux disease)   . Gastropathy   . Chronic pain   . Depression   . Edema   . Headache   . 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 Dialysis patient 04/18/12    "[facility]; [place], [place], [place]"  . Blood transfusion   . Arthritis     "I think I do; just in my fingers & my hands"  . ESRD (end stage renal disease) on dialysis   . Type I diabetes mellitus     "juvenile"  . Gastroparesis     Past Surgical History  Procedure Laterality Date  . Below knee leg amputation  "it's been awhile"    bilaterally  . Cataract extraction  ~ 2011    right  . Av fistula placement  08/2011    left upper arm     Family History  Problem Relation Age of Onset  . Diabetes Other   . Hypertension Other   . Heart disease Other     History  Substance Use Topics  . Smoking status: Never Smoker   . Smokeless tobacco: Never Used  . Alcohol Use: No      Review of Systems  Constitutional: Negative for fever, chills, diaphoresis, activity change, appetite change and fatigue.  HENT: Negative for congestion, sore throat, facial swelling, rhinorrhea, drooling, neck pain and voice change.   Respiratory: Positive for cough and shortness of breath. Negative for stridor.   Cardiovascular: Positive for chest pain.  Gastrointestinal: Positive for nausea and vomiting. Negative for abdominal pain, diarrhea and abdominal distention.  Endocrine: Negative for polydipsia and polyuria.  Genitourinary: Negative for dysuria, urgency, frequency and decreased urine volume.  Musculoskeletal: Negative for back pain and gait problem.  Skin: Negative for color change and wound.  Neurological: Positive for dizziness. Negative for facial asymmetry, weakness, numbness and headaches.  Hematological: Does not bruise/bleed easily.  Psychiatric/Behavioral: Negative for confusion and agitation.    Allergies  Review of patient's allergies indicates no known allergies.  Home Medications   Current Outpatient Rx  Name  Route  Sig  Dispense  Refill  . aspirin 81 MG EC tablet   Oral   Take 81 mg  by mouth daily.           . calcium acetate (PHOSLO) 667 MG capsule   Oral   Take 2,001 mg by mouth 3 (three) times daily with meals.         . cloNIDine (CATAPRES) 0.1 MG tablet   Oral   Take 1 tablet (0.1 mg total) by mouth 2 (two) times daily.   90 tablet   3   . glucagon 1 MG injection   Intravenous   Inject 1 mg into the vein once as needed. For low blood sugar   1 each   5   . insulin aspart (NOVOLOG FLEXPEN) 100 UNIT/ML injection   Subcutaneous   Inject 2-10 Units into the skin 3 (three) times daily.  Sliding scale   5 pen   3   . insulin detemir (LEVEMIR) 100 UNIT/ML injection   Subcutaneous   Inject 20 Units into the skin every morning.   10 mL   12     Dispense enough for one month supply.   . labetalol (NORMODYNE) 200 MG tablet   Oral   Take 1 tablet (200 mg total) by mouth 2 (two) times daily.   60 tablet   2   . losartan (COZAAR) 100 MG tablet   Oral   Take 1 tablet (100 mg total) by mouth at bedtime.   30 tablet   2   . metoCLOPramide (REGLAN) 10 MG tablet   Oral   Take 1 tablet (10 mg total) by mouth 4 (four) times daily.   120 tablet   2   . ondansetron (ZOFRAN) 4 MG tablet   Oral   Take 1 tablet (4 mg total) by mouth every 8 (eight) hours as needed. For nausea   20 tablet   2   . pantoprazole (PROTONIX) 40 MG tablet   Oral   Take 1 tablet (40 mg total) by mouth 2 (two) times daily.   60 tablet   2   . promethazine (PHENERGAN) 12.5 MG tablet   Oral   Take 12.5 mg by mouth every 6 (six) hours as needed. For nausea         . sertraline (ZOLOFT) 50 MG tablet   Oral   Take 50 mg by mouth daily.         [name] azithromycin (ZITHROMAX Z-PAK) 250 MG tablet      2 po day one, then 1 daily x 4 days   5 tablet   0     BP 179/102  Pulse 83  Temp(Src) 98.1 F (36.7 C)  Resp 18  SpO2 100%  Physical Exam  Constitutional: He is oriented to person, place, and time. He appears well-developed and well-nourished. No distress.  HENT:  Head: Normocephalic and atraumatic.  Mouth/Throat: No oropharyngeal exudate.  Eyes: Pupils are equal, round, and reactive to light.  Neck: Normal range of motion. Neck supple.  Cardiovascular: Normal rate and regular rhythm.  Exam reveals no gallop and no friction rub.   Murmur heard.  Systolic murmur is present with a grade of 3/6  Pulmonary/Chest: Effort normal and breath sounds normal. No respiratory distress. He has no wheezes. He has no rales.    Abdominal: Soft. Bowel sounds are normal. He exhibits no distension  and no mass. There is no tenderness. There is no rebound and no guarding.  Musculoskeletal: Normal range of motion. He exhibits no edema and no tenderness.  BL AKA  Neurological: He is alert and  oriented to person, place, and time.  Skin: Skin is warm and dry.  Psychiatric: He has a normal mood and affect.    ED Course  Procedures (including critical care time)  Labs Reviewed  CBC WITH DIFFERENTIAL - Abnormal; Notable for the following:    RBC 3.68 (*)    Hemoglobin 11.3 (*)    HCT 32.3 (*)    Eosinophils Relative 7 (*)    All other components within normal limits  BASIC METABOLIC PANEL - Abnormal; Notable for the following:    Sodium 130 (*)    Potassium 3.4 (*)    Chloride 90 (*)    Glucose, Bld 338 (*)    BUN 29 (*)    Creatinine, Ser 9.74 (*)    GFR calc non Af Amer 6 (*)    GFR calc Af Amer 7 (*)    All other components within normal limits  TROPONIN I  TROPONIN I   Dg Chest 2 View  03/05/2013  *RADIOLOGY REPORT*  Clinical Data: Chest pain  CHEST - 2 VIEW  Comparison:  August 20, 2012  Findings: There is subtle reticular interstitial disease in the right base.  Lungs are otherwise clear.  Heart size and pulmonary vascularity are normal.  No adenopathy.  No bone lesions.  No pneumothorax.  IMPRESSION: Suspect subtle interstitial pneumonitis right base. Lungs otherwise clear.   Original Report Authenticated By: Lowella Grip, M.D.    Ct Angio Chest Pe W/cm &/or Wo Cm  03/05/2013  *RADIOLOGY REPORT*  Clinical Data: Left sided pleuritic chest pain and shortness of breath  CT ANGIOGRAPHY CHEST  Technique:  Multidetector CT imaging of the chest using the standard protocol during bolus administration of intravenous contrast. Multiplanar reconstructed images including MIPs were obtained and reviewed to evaluate the vascular anatomy.  Contrast: 156mL OMNIPAQUE IOHEXOL 350 MG/ML SOLN  Comparison: None  Findings: Lungs/pleura: There is no pleural effusion identified. No airspace  consolidation or atelectasis identified.  No suspicious pulmonary nodule or mass noted.  Heart/Mediastinum: Heart size is moderately enlarged.  No pericardial effusion identified.  There is no enlarged mediastinal or hilar lymph nodes.  The main pulmonary artery is patent.  There is no saddle embolus. Segmental pulmonary arteries are also patent.  Upper abdomen: Imaging through the upper abdomen shows no acute findings.  Bones/Musculoskeletal:  Review of the visualized osseous structures is unremarkable.  IMPRESSION:  1.  No acute cardiopulmonary abnormalities. 2.  No evidence for pulmonary embolus   Original Report Authenticated By: Kerby Moors, M.D.      1. Chest pain   2. Pneumonitis      Date: 03/05/2013  Rate: 83  Rhythm: normal sinus rhythm  QRS Axis: left  Intervals: normal  ST/T Wave abnormalities: nonspecific T wave changes and TW flattening II, III, aVL, TWI V5, V6  Conduction Disutrbances:left anterior fascicular block  Narrative Interpretation:   Old EKG Reviewed: changes noted, prior w/ TWI II, III  Angiocath insertion Performed by: Ernestina Patches  Consent: Verbal consent obtained. Risks and benefits: risks, benefits and alternatives were discussed Time out: Immediately prior to procedure a "time out" was called to verify the correct patient, procedure, equipment, support staff and site/side marked as required.  Preparation: Patient was prepped and draped in the usual sterile fashion.  Vein Location: L bacilic  Ultrasound Guided  Gauge: 20  Normal blood return and flush without difficulty Patient tolerance: Patient tolerated the procedure well with no immediate complications.     MDM  Pt  is a 37 y.o. male with pertinent PMHX of DM, HTN, BL BKA, ESRD on T, TH, S HD who presents with sharp L sided CP since yesterday w/o radiation, with assoc mild SOB.  He has also had nausea, dry heaves & d/a since yesterday, did not eat or drink yesterday.  Denies fever, ab  pain, decreased UOP.  Pain worse w/ mvmt, deep breathing, palpation; better when laying still.  Pt only got about 45 mins of HD today.  On exam pt is hypertensive, w/ nml pulse rate, O2 sat, afebrile.  +ttp L chest, lungs clear, abdominal exam benign, no LE edema.  Mouth dry.  Ddx includes pna, pleuricy, MSK pain, costochondritis, PE.  Pain would be atypical for ACS.  EKG w/o significant change.  Have ordered CXR, CBC, BMP, trop.   11:21AM trop nml, CXR w/ R sided pneumonitis.  Will given home labetelol, and get CT PE study.   12:29 PM, pt family member, pt may have kidney at [facility] for transplant, have been unable to confirm.  1:12 PM There is no kidney, US guided IV placed by myself.   4:27PM CT chest unremarkable.  Second troponin negative, will d/c home w/ strict return precautions for new or worsening symptoms.  Will put of z-pack for pneumonitis seen on CXR. I doubt cardia cause of chest pain. I do tnot blieve pt requires emergent HD given low-nml K, confortable respiratory status and CXR findings.   1. Chest pain   2. Pneumonitis      Labs and imaging considered in decision making, reviewed by myself.  Imaging interpreted by radiology. Pt care discussed with my attending, Dr. Sabra Heck.        Ernestina Patches, MD 03/05/13 1630

## 2013-03-05 NOTE — ED Notes (Signed)
IV Team at bedside 

## 2013-03-05 NOTE — ED Notes (Signed)
Pt here per [facility] from dialyisis center with complaint of CP since yesterday.  Pt was placed on dialysis and advised staff he was having CP they called 911. Pt still has fistula accessed in left arm.

## 2013-03-11 MED ORDER — IOHEXOL 350 MG/ML SOLN
100.0000 mL | Freq: Once | INTRAVENOUS | Status: AC | PRN
  Administered 2013-03-09: 100 mL via INTRAVENOUS

## 2013-03-12 ENCOUNTER — Encounter: Payer: Self-pay | Admitting: Vascular Surgery

## 2013-03-13 ENCOUNTER — Ambulatory Visit: Payer: Medicare Other | Admitting: Vascular Surgery

## 2013-03-14 ENCOUNTER — Emergency Department (HOSPITAL_COMMUNITY)
Admission: EM | Admit: 2013-03-14 | Discharge: 2013-03-14 | Disposition: A | Discharge status: Home / Self Care | Payer: Medicare Other | Attending: Emergency Medicine | Admitting: Emergency Medicine

## 2013-03-14 ENCOUNTER — Emergency Department (HOSPITAL_COMMUNITY): Payer: Medicare Other

## 2013-03-14 ENCOUNTER — Encounter (HOSPITAL_COMMUNITY): Payer: Self-pay | Admitting: *Deleted

## 2013-03-14 DIAGNOSIS — L280 Lichen simplex chronicus: Secondary | ICD-10-CM | POA: Insufficient documentation

## 2013-03-14 DIAGNOSIS — I120 Hypertensive chronic kidney disease with stage 5 chronic kidney disease or end stage renal disease: Secondary | ICD-10-CM | POA: Insufficient documentation

## 2013-03-14 DIAGNOSIS — K219 Gastro-esophageal reflux disease without esophagitis: Secondary | ICD-10-CM | POA: Insufficient documentation

## 2013-03-14 DIAGNOSIS — Z8739 Personal history of other diseases of the musculoskeletal system and connective tissue: Secondary | ICD-10-CM | POA: Insufficient documentation

## 2013-03-14 DIAGNOSIS — S88119A Complete traumatic amputation at level between knee and ankle, unspecified lower leg, initial encounter: Secondary | ICD-10-CM | POA: Insufficient documentation

## 2013-03-14 DIAGNOSIS — Z79899 Other long term (current) drug therapy: Secondary | ICD-10-CM | POA: Insufficient documentation

## 2013-03-14 DIAGNOSIS — E109 Type 1 diabetes mellitus without complications: Secondary | ICD-10-CM | POA: Insufficient documentation

## 2013-03-14 DIAGNOSIS — Z794 Long term (current) use of insulin: Secondary | ICD-10-CM | POA: Insufficient documentation

## 2013-03-14 DIAGNOSIS — F3289 Other specified depressive episodes: Secondary | ICD-10-CM | POA: Insufficient documentation

## 2013-03-14 DIAGNOSIS — Z7982 Long term (current) use of aspirin: Secondary | ICD-10-CM | POA: Insufficient documentation

## 2013-03-14 DIAGNOSIS — N186 End stage renal disease: Secondary | ICD-10-CM | POA: Insufficient documentation

## 2013-03-14 DIAGNOSIS — Z872 Personal history of diseases of the skin and subcutaneous tissue: Secondary | ICD-10-CM | POA: Insufficient documentation

## 2013-03-14 DIAGNOSIS — Z8719 Personal history of other diseases of the digestive system: Secondary | ICD-10-CM | POA: Insufficient documentation

## 2013-03-14 DIAGNOSIS — Z992 Dependence on renal dialysis: Secondary | ICD-10-CM | POA: Insufficient documentation

## 2013-03-14 DIAGNOSIS — G8929 Other chronic pain: Secondary | ICD-10-CM | POA: Insufficient documentation

## 2013-03-14 MED ORDER — HYDROCODONE-ACETAMINOPHEN 5-325 MG PO TABS: 1.0000 | ORAL_TABLET | Freq: Four times a day (QID) | ORAL | Status: DC | PRN

## 2013-03-14 MED ORDER — OXYCODONE-ACETAMINOPHEN 5-325 MG PO TABS
1.0000 | ORAL_TABLET | Freq: Once | ORAL | Status: AC
  Administered 2013-03-14: 1 via ORAL
  Filled 2013-03-14: qty 1

## 2013-03-14 MED ORDER — PREDNISONE (PAK) 10 MG PO TABS: ORAL_TABLET | ORAL | Status: DC

## 2013-03-14 MED ORDER — CARRINGTON MOISTURE BARRIER EX CREA: TOPICAL_CREAM | CUTANEOUS | Status: DC | PRN

## 2013-03-14 NOTE — ED Notes (Signed)
Pt with bil bka to ED c/o pain and irritation r/t calouse to R bka.  Area coarse and dry and pt states "pieces falling" off.
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
## 2013-03-17 NOTE — ED Provider Notes (Signed)
Medical screening examination/treatment/procedure(s) were performed by non-physician practitioner and as supervising physician I was immediately available for consultation/collaboration.   [name], DO 03/17/13 0121

## 2013-03-18 ENCOUNTER — Encounter: Payer: Self-pay | Admitting: *Deleted

## 2013-03-18 ENCOUNTER — Encounter: Payer: Self-pay | Admitting: Family Medicine

## 2013-03-18 ENCOUNTER — Ambulatory Visit (INDEPENDENT_AMBULATORY_CARE_PROVIDER_SITE_OTHER): Payer: Medicare Other | Admitting: Family Medicine

## 2013-03-18 VITALS — BP 217/112 | HR 91 | Temp 98.5°F | Ht 72.0 in | Wt 163.0 lb

## 2013-03-18 DIAGNOSIS — E1149 Type 2 diabetes mellitus with other diabetic neurological complication: Secondary | ICD-10-CM

## 2013-03-18 DIAGNOSIS — E1165 Type 2 diabetes mellitus with hyperglycemia: Secondary | ICD-10-CM

## 2013-03-18 DIAGNOSIS — T8789 Other complications of amputation stump: Secondary | ICD-10-CM

## 2013-03-18 DIAGNOSIS — E119 Type 2 diabetes mellitus without complications: Secondary | ICD-10-CM

## 2013-03-18 DIAGNOSIS — E1065 Type 1 diabetes mellitus with hyperglycemia: Secondary | ICD-10-CM

## 2013-03-18 DIAGNOSIS — I10 Essential (primary) hypertension: Secondary | ICD-10-CM

## 2013-03-18 DIAGNOSIS — T879 Unspecified complications of amputation stump: Secondary | ICD-10-CM

## 2013-03-18 DIAGNOSIS — E108 Type 1 diabetes mellitus with unspecified complications: Secondary | ICD-10-CM

## 2013-03-18 DIAGNOSIS — K3184 Gastroparesis: Secondary | ICD-10-CM

## 2013-03-18 LAB — POCT GLYCOSYLATED HEMOGLOBIN (HGB A1C): Hemoglobin A1C: 12.3

## 2013-03-18 MED ORDER — PANTOPRAZOLE SODIUM 40 MG PO TBEC: 40.0000 mg | DELAYED_RELEASE_TABLET | Freq: Every day | ORAL | Status: DC

## 2013-03-18 MED ORDER — INSULIN DETEMIR 100 UNIT/ML ~~LOC~~ SOLN: 40.0000 [IU] | Freq: Every morning | SUBCUTANEOUS | Status: DC

## 2013-03-18 MED ORDER — HYDROCODONE-ACETAMINOPHEN 5-325 MG PO TABS: 1.0000 | ORAL_TABLET | Freq: Four times a day (QID) | ORAL | Status: DC | PRN

## 2013-03-18 MED ORDER — LABETALOL HCL 200 MG PO TABS: 200.0000 mg | ORAL_TABLET | Freq: Two times a day (BID) | ORAL | Status: DC

## 2013-03-18 MED ORDER — CLONIDINE HCL 0.1 MG PO TABS: 0.1000 mg | ORAL_TABLET | Freq: Two times a day (BID) | ORAL | Status: DC

## 2013-03-18 MED ORDER — LOSARTAN POTASSIUM 100 MG PO TABS: 100.0000 mg | ORAL_TABLET | Freq: Every day | ORAL | Status: DC

## 2013-03-18 MED ORDER — ONDANSETRON HCL 4 MG PO TABS: 4.0000 mg | ORAL_TABLET | Freq: Three times a day (TID) | ORAL | Status: DC | PRN

## 2013-03-18 MED ORDER — PROMETHAZINE HCL 12.5 MG PO TABS: 12.5000 mg | ORAL_TABLET | Freq: Four times a day (QID) | ORAL | Status: DC | PRN

## 2013-03-18 MED ORDER — INSULIN ASPART 100 UNIT/ML ~~LOC~~ SOLN: 10.0000 [IU] | Freq: Three times a day (TID) | SUBCUTANEOUS | Status: DC

## 2013-03-18 MED ORDER — SERTRALINE HCL 50 MG PO TABS: 50.0000 mg | ORAL_TABLET | Freq: Every day | ORAL | Status: DC

## 2013-03-18 MED ORDER — METOCLOPRAMIDE HCL 10 MG PO TABS: 10.0000 mg | ORAL_TABLET | Freq: Three times a day (TID) | ORAL | Status: DC | PRN

## 2013-03-18 NOTE — Assessment & Plan Note (Signed)
Has significant callus and pain on left stump rather chronically. Recent x-rays showed no sign of infection. Advised patient to followup at wound care center for continued management since he has not been in over one year. Patient request narcotics for this problem intermittently. Provided 10 tablet Vicodin Rx acutely only. Will Rx prescription for crutches so that pressure can be offloaded from stump currently.

## 2013-03-18 NOTE — Assessment & Plan Note (Signed)
Ran out of medication secondary to finances. Refilled these for his pickup. Will be reassessed with dialysis tomorrow. Follow up in clinic in one to 2 weeks.
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
## 2013-03-18 NOTE — Patient Instructions (Addendum)
Make appointment with [facility] for your leg.  Use eucerin ointment on stump for now. Get your medications restarted. Make an appointment in 2 weeks for BP recheck. Make an appointment with Dr. Valentina Lucks for diabetes in 4 weeks.

## 2013-03-18 NOTE — Assessment & Plan Note (Signed)
Refilled reglan for prn use.

## 2013-03-18 NOTE — Assessment & Plan Note (Signed)
Uncontrolled do to noncompliance with medication and financial barriers. States he can pick up his insulin at the pharmacy this week. Did not make insulin adjustments today due to recurrent hypoglycemic episodes. Strongly advised patient to followup with pharmacy clinic and [facility] assessment.

## 2013-03-19 ENCOUNTER — Telehealth: Payer: Self-pay | Admitting: *Deleted

## 2013-03-19 NOTE — Telephone Encounter (Signed)
Pt informed. Geneieve Duell S  

## 2013-03-19 NOTE — Telephone Encounter (Signed)
Message copied by Corinna Capra on Tue Mar 19, 2013  9:36 AM ------      Message from: Clovis Cao      Created: Mon Mar 18, 2013  5:40 PM       Please notify patient I left rx for crutches up front to pick up if he needs. ------

## 2013-03-25 ENCOUNTER — Encounter (HOSPITAL_BASED_OUTPATIENT_CLINIC_OR_DEPARTMENT_OTHER): Payer: Medicare Other | Attending: General Surgery

## 2013-03-25 DIAGNOSIS — T8789 Other complications of amputation stump: Secondary | ICD-10-CM | POA: Insufficient documentation

## 2013-03-25 DIAGNOSIS — Y835 Amputation of limb(s) as the cause of abnormal reaction of the patient, or of later complication, without mention of misadventure at the time of the procedure: Secondary | ICD-10-CM | POA: Insufficient documentation

## 2013-03-26 NOTE — Progress Notes (Signed)
[facility]  NAME:  [name], [name]            ACCOUNT NO.:  [id number]  MEDICAL RECORD NO.:  [id number]      DATE OF BIRTH:  [birth year]  PHYSICIAN:  Judene Companion, M.D.      VISIT DATE:  03/25/2013                                  OFFICE VISIT   This is a 37 year old African American male, diabetic for 22 years, who has had all the complications one could possibly have.  He has hypertension.  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  He is on dialysis. He has hypertension.  MEDICATIONS:  He takes clonidine 1 mg 2 times a day, NovoLog insulin 10- 15 units 3 times a day, Levemir insulin 40 units a day, labetalol 200 mg a day, Cozaar 100 mg a day, Reglan because of gastroparesis, he takes Protonix, he takes Phenergan, he takes Zoloft, he takes Vicodin for pain.  This gentleman, therefore, has type 1 diabetes.  He has also got renal failure, he is on dialysis 3 times a week and has hypertension.  His right stump has a huge amount of callus, scaly and stony hard and built up like pebbles on the stump.  I debrided this all off with a blade and put on Bag Balm, and we wrapped him so that he could wear his prosthesis as this is his only way of getting around.  We told him to get some Bag Balm and we would see him back here in a week.  When he was here being worked out, his blood sugar was 212 his blood pressure was 225/122.  His temperature 98, pulse 80, respirations 18.  He will be back here in a week and we will reassess him.     Judene Companion, M.D.     PP/MEDQ  D:  03/25/2013  T:  03/26/2013  Job:  [id number]

## 2013-04-01 ENCOUNTER — Encounter (HOSPITAL_BASED_OUTPATIENT_CLINIC_OR_DEPARTMENT_OTHER): Payer: Medicare Other | Attending: General Surgery

## 2013-04-05 ENCOUNTER — Telehealth: Payer: Self-pay | Admitting: Family Medicine

## 2013-04-05 NOTE — Telephone Encounter (Signed)
Pt needs FMLA papers filled out as soon as possible and would like to speak with Dr. Verlee Rossetti concerning the papers.

## 2013-04-06 NOTE — Telephone Encounter (Signed)
Forms completed on Friday and returned to [place].

## 2013-04-08 NOTE — Telephone Encounter (Signed)
Johnathan notified FMLA forms have been completed and are ready to be picked up at front desk.  Lauralyn Primes

## 2013-04-09 ENCOUNTER — Encounter: Payer: Self-pay | Admitting: Vascular Surgery

## 2013-04-10 ENCOUNTER — Ambulatory Visit: Payer: Medicare Other | Admitting: Vascular Surgery

## 2013-05-08 DIAGNOSIS — E1043 Type 1 diabetes mellitus with diabetic autonomic (poly)neuropathy: Secondary | ICD-10-CM | POA: Insufficient documentation

## 2013-06-12 ENCOUNTER — Ambulatory Visit: Payer: Medicare Other | Admitting: Vascular Surgery

## 2013-06-27 DIAGNOSIS — Z9483 Pancreas transplant status: Secondary | ICD-10-CM | POA: Insufficient documentation

## 2013-06-27 DIAGNOSIS — D849 Immunodeficiency, unspecified: Secondary | ICD-10-CM | POA: Insufficient documentation

## 2013-07-12 ENCOUNTER — Encounter (HOSPITAL_BASED_OUTPATIENT_CLINIC_OR_DEPARTMENT_OTHER): Payer: Medicare Other

## 2013-07-19 ENCOUNTER — Encounter: Payer: Self-pay | Admitting: Family Medicine

## 2013-07-19 ENCOUNTER — Ambulatory Visit (INDEPENDENT_AMBULATORY_CARE_PROVIDER_SITE_OTHER): Payer: Medicare Other | Admitting: Family Medicine

## 2013-07-19 VITALS — BP 138/83 | HR 89 | Temp 99.2°F | Wt 154.0 lb

## 2013-07-19 DIAGNOSIS — S88119A Complete traumatic amputation at level between knee and ankle, unspecified lower leg, initial encounter: Secondary | ICD-10-CM

## 2013-07-19 DIAGNOSIS — Z940 Kidney transplant status: Secondary | ICD-10-CM

## 2013-07-19 DIAGNOSIS — T879 Unspecified complications of amputation stump: Secondary | ICD-10-CM

## 2013-07-19 DIAGNOSIS — Z9483 Pancreas transplant status: Secondary | ICD-10-CM

## 2013-07-19 DIAGNOSIS — Z89512 Acquired absence of left leg below knee: Secondary | ICD-10-CM

## 2013-07-19 NOTE — Progress Notes (Signed)
[name] is a 37 y.o. male who presents today for B/L BKA irritation and s/p renal/pancreatitic transplantation.  B/L BKA - Pt having extreme irritation of the areas over the last 7-10 days, especially on the left with one open sore w/o erythema, edema, pustular drainage, fever, chills sweats.  Has not tried to wrap the area or change his activity level.  Would like some help at home and to follow up at the wound care center again.    Also would like Rx for new prosthetic supplies.    Pancreatic/Renal Transplants - Done around June 20th 2014 at [facility]  Is following with them and updated medication list at today's visit.  Has another appointment with them on 7/28.    Past Medical History  Diagnosis Date  . Hypertension   . GERD (gastroesophageal reflux disease)   . Gastropathy   . Chronic pain   . Depression   . Edema   . Headache(784.0)   . 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 Dialysis patient 04/18/12    "[facility]; [place], [place], [place]"  . Blood transfusion   . Arthritis     "I think I do; just in my fingers & my hands"  . ESRD (end stage renal disease) on dialysis   . Type I diabetes mellitus     "juvenile"  . Gastroparesis     History  Smoking status  . Never Smoker   Smokeless tobacco  . Never Used    Family History  Problem Relation Age of Onset  . Diabetes Other   . Hypertension Other   . Heart disease Other     Current Outpatient Prescriptions on File Prior to Visit  Medication Sig Dispense Refill  . calcium acetate (PHOSLO) 667 MG capsule Take 2,001 mg by mouth 3 (three) times daily with meals.      . cloNIDine (CATAPRES) 0.1 MG tablet Take 1 tablet (0.1 mg total) by mouth 2 (two) times daily.  90 tablet  3  . glucagon 1 MG injection Inject 1 mg into the vein once as needed. For low blood sugar  1 each  5  . HYDROcodone-acetaminophen (NORCO/VICODIN) 5-325 MG per tablet Take 1 tablet by mouth every 6 (six) hours as needed for pain.  10  tablet  0  . labetalol (NORMODYNE) 200 MG tablet Take 1 tablet (200 mg total) by mouth 2 (two) times daily.  60 tablet  5   No current facility-administered medications on file prior to visit.    ROS: Per HPI.  All other systems reviewed and are negative.   Physical Exam Filed Vitals:   07/19/13 1458  BP: 138/83  Pulse: 89  Temp: 99.2 F (37.3 C)    Physical Examination: General appearance - alert, well appearing, and in no distress Chest - clear to auscultation, no wheezes, rales or rhonchi, symmetric air entry Heart - normal rate and regular rhythm, no murmurs noted Extremities - B/L BKA w/ callused skin formation R stump at apex.  L w/ 1 x 1 cm of open ulcer, non draining/non purulent, cannot probe bone, no erythema, edema, or TTP of area.   Lab Results  Component Value Date   HGBA1C 12.3 03/18/2013

## 2013-07-19 NOTE — Assessment & Plan Note (Signed)
Pt prosthetic supply Rx refilled today.  Will try to set up with [facility], message sent to coordinator, to see if he qualifies.  Otherwise will try to have home health evaluation for patient.

## 2013-07-19 NOTE — Assessment & Plan Note (Signed)
Recently done, on immunosuppressant drugs managed by his transplant surgeons.

## 2013-07-19 NOTE — Assessment & Plan Note (Addendum)
Pt w/ non draining, non pustular 1 x 1 cm ulcer at the left apex of his stump.  No S/Sx of systemic infection or local infection, mostly just irritation.  Will apply tefla pads with tegraderm film overtop for now and refill for his prosthetic supplies.  As well will evaluate further if pt would qualify for [facility] help or home health referral for evaluation of services.  Will also put referral in for wound clinic so pt can go back there.  Advised pt to make f/u appointment sooner than 3 months if he starts to have fever, drainage of the ulcer, or erythema around the area.  Advised to go to ED if has fever, hypotension, or worsening area of infection.

## 2013-07-19 NOTE — Patient Instructions (Addendum)
[name], it was nice meeting you today.  Please apply the pads with the film over top of these to keep these in place.  We will work on getting you set up with home health information as well as back into wound clinic.    Thanks, Dr. Awanda Mink

## 2013-07-19 NOTE — Assessment & Plan Note (Signed)
Recently done at [facility] and managed by transplant surgeons.  On immunosuppressant medications.

## 2013-07-23 ENCOUNTER — Encounter: Payer: Self-pay | Admitting: Vascular Surgery

## 2013-07-24 ENCOUNTER — Ambulatory Visit: Payer: Medicare Other | Admitting: Vascular Surgery

## 2013-08-02 ENCOUNTER — Telehealth: Payer: Self-pay | Admitting: *Deleted

## 2013-08-02 NOTE — Telephone Encounter (Signed)
NPI given after confirmation of pt  - for glaucoma ( 3 visits approved) Tildon Husky, RN-BSN

## 2013-08-08 ENCOUNTER — Encounter (HOSPITAL_COMMUNITY): Payer: Self-pay | Admitting: *Deleted

## 2013-08-08 ENCOUNTER — Emergency Department (HOSPITAL_COMMUNITY)
Admission: EM | Admit: 2013-08-08 | Discharge: 2013-08-08 | Disposition: A | Discharge status: Home / Self Care | Payer: Medicare Other | Attending: Emergency Medicine | Admitting: Emergency Medicine

## 2013-08-08 ENCOUNTER — Emergency Department (HOSPITAL_COMMUNITY): Payer: Medicare Other

## 2013-08-08 DIAGNOSIS — I120 Hypertensive chronic kidney disease with stage 5 chronic kidney disease or end stage renal disease: Secondary | ICD-10-CM | POA: Insufficient documentation

## 2013-08-08 DIAGNOSIS — Z992 Dependence on renal dialysis: Secondary | ICD-10-CM | POA: Insufficient documentation

## 2013-08-08 DIAGNOSIS — E109 Type 1 diabetes mellitus without complications: Secondary | ICD-10-CM | POA: Insufficient documentation

## 2013-08-08 DIAGNOSIS — M129 Arthropathy, unspecified: Secondary | ICD-10-CM | POA: Insufficient documentation

## 2013-08-08 DIAGNOSIS — M542 Cervicalgia: Secondary | ICD-10-CM

## 2013-08-08 DIAGNOSIS — G8929 Other chronic pain: Secondary | ICD-10-CM | POA: Insufficient documentation

## 2013-08-08 DIAGNOSIS — N186 End stage renal disease: Secondary | ICD-10-CM | POA: Insufficient documentation

## 2013-08-08 DIAGNOSIS — K219 Gastro-esophageal reflux disease without esophagitis: Secondary | ICD-10-CM | POA: Insufficient documentation

## 2013-08-08 DIAGNOSIS — IMO0002 Reserved for concepts with insufficient information to code with codable children: Secondary | ICD-10-CM | POA: Insufficient documentation

## 2013-08-08 DIAGNOSIS — Z8659 Personal history of other mental and behavioral disorders: Secondary | ICD-10-CM | POA: Insufficient documentation

## 2013-08-08 DIAGNOSIS — Z8719 Personal history of other diseases of the digestive system: Secondary | ICD-10-CM | POA: Insufficient documentation

## 2013-08-08 DIAGNOSIS — Z79899 Other long term (current) drug therapy: Secondary | ICD-10-CM | POA: Insufficient documentation

## 2013-08-08 DIAGNOSIS — Z7982 Long term (current) use of aspirin: Secondary | ICD-10-CM | POA: Insufficient documentation

## 2013-08-08 DIAGNOSIS — R079 Chest pain, unspecified: Secondary | ICD-10-CM | POA: Insufficient documentation

## 2013-08-08 LAB — BASIC METABOLIC PANEL
Calcium: 9.4 mg/dL (ref 8.4–10.5)
GFR calc non Af Amer: 65 mL/min — ABNORMAL LOW (ref >90)
Glucose, Bld: 102 mg/dL — ABNORMAL HIGH (ref 70–99)
Sodium: 136 mEq/L (ref 135–145)

## 2013-08-08 LAB — CBC
Hemoglobin: 12.5 g/dL — ABNORMAL LOW (ref 13.0–17.0)
MCH: 30.5 pg (ref 26.0–34.0)
MCHC: 32.7 g/dL (ref 30.0–36.0)

## 2013-08-08 MED ORDER — OXYCODONE-ACETAMINOPHEN 5-325 MG PO TABS
2.0000 | ORAL_TABLET | Freq: Once | ORAL | Status: AC
  Administered 2013-08-08: 2 via ORAL
  Filled 2013-08-08: qty 2

## 2013-08-08 MED ORDER — HYDROCODONE-ACETAMINOPHEN 5-325 MG PO TABS: 1.0000 | ORAL_TABLET | ORAL | Status: DC | PRN

## 2013-08-08 NOTE — ED Notes (Signed)
Pt states he has been having left upper cp, neck pain, and shoulder pain for the past cple wks. Pt's doctor thought it was due to his hickman cath and took it out on Monday. Pt states pain has become worse and he is now having fevers and night sweats. Nonproductive cough. 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

## 2013-08-08 NOTE — ED Provider Notes (Signed)
CSN: [id number]     Arrival date & time 08/08/13  1402 History     First MD Initiated Contact with Patient 08/08/13 1507     Chief Complaint  Patient presents with  . Neck Pain  . Shoulder Pain  . Chest Pain    HPI Patient reports left clavicular and left neck pain over the past 4-5 days.  His physicians believed that could be secondary to his Hickman catheter and therefore the Hickman catheter was removed from his left subclavian vein and left anterior chest approximately 4 days ago.  He feels like he had fever and chills last night and thus he presents the emergency department for evaluation.  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  She's been compliant with his medications.  Denies cough or congestion.  No shortness of breath.  No rash noted.  No dysuria or urinary frequency.  No abdominal pain.  No nausea vomiting or diarrhea.  No documented fever at home.  Patient states his pain is worse with movement of his neck as well as movement of his left arm.   Past Medical History  Diagnosis Date  . Hypertension   . GERD (gastroesophageal reflux disease)   . Gastropathy   . Chronic pain   . Depression   . Edema   . Headache(784.0)   . 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 Dialysis patient 04/18/12    "[facility]; [place], [place], [place]"  . Blood transfusion   . Arthritis     "I think I do; just in my fingers & my hands"  . Type I diabetes mellitus     "juvenile"  . Gastroparesis   . ESRD (end stage renal disease) on dialysis    Past Surgical History  Procedure Laterality Date  . Below knee leg amputation  "it's been awhile"    bilaterally  . Cataract extraction  ~ 2011    right  . Av fistula placement  08/2011    left upper arm  . Combined kidney-pancreas transplant     Family History  Problem Relation Age of Onset  . Diabetes Other   . Hypertension Other   . Heart disease Other    History  Substance Use Topics  .  Smoking status: Never Smoker   . Smokeless tobacco: Never Used  . Alcohol Use: Yes    Review of Systems  All other systems reviewed and are negative.    Allergies  Review of patient's allergies indicates no known allergies.  Home Medications   Current Outpatient Rx  Name  Route  Sig  Dispense  Refill  . aspirin EC 325 MG tablet      325 mg. Take 1 tablet (325 mg total) by mouth daily.         . cloNIDine (CATAPRES) 0.1 MG tablet   Oral   Take 0.1 mg by mouth at bedtime as needed (for high blood pressure).         . labetalol (NORMODYNE) 200 MG tablet   Oral   Take 1 tablet (200 mg total) by mouth 2 (two) times daily.   60 tablet   5   . mycophenolate (MYFORTIC) 180 MG EC tablet   Oral   Take 540 mg by mouth 2 (two) times daily.          [name] omeprazole (PRILOSEC) 20 MG capsule   Oral   Take 20 mg by mouth 2 (two) times daily.         [name]  oxyCODONE-acetaminophen (PERCOCET/ROXICET) 5-325 MG per tablet   Oral   Take 1 tablet by mouth every 4 (four) hours as needed for pain.         . phosphorus (K PHOS NEUTRAL) 155-852-130 MG tablet   Oral   Take 500 mg by mouth 2 (two) times daily. Take 2 tablets (500 mg total) by mouth 2 times daily.         . predniSONE (DELTASONE) 5 MG tablet   Oral   Take 5 mg by mouth daily.          . tacrolimus (PROGRAF) 1 MG capsule   Oral   Take 6-7 mg by mouth 2 (two) times daily. Take 7 capsules in the morning and 6 every evening         . valGANciclovir (VALCYTE) 450 MG tablet      450 mg. Take 1 tablet (450 mg total) by mouth daily.         [name] HYDROcodone-acetaminophen (NORCO/VICODIN) 5-325 MG per tablet   Oral   Take 1 tablet by mouth every 4 (four) hours as needed for pain.   15 tablet   0    BP 150/92  Pulse 82  Temp(Src) 98.2 F (36.8 C) (Oral)  Resp 16  SpO2 100% Physical Exam  Nursing note and vitals reviewed. Constitutional: He is oriented to person, place, and time. He appears well-developed and  well-nourished.  HENT:  Head: Normocephalic and atraumatic.  Eyes: EOM are normal.  Neck: Normal range of motion. Neck supple. No tracheal deviation present. No thyromegaly present.  Mild tenderness along the left super clavicular space without swelling or erythema.  Healing Hickman scars on his anterior left chest without secondary signs of infection.  Cardiovascular: Normal rate, regular rhythm, normal heart sounds and intact distal pulses.   Pulmonary/Chest: Effort normal and breath sounds normal. No stridor. No respiratory distress.  Abdominal: Soft. He exhibits no distension. There is no tenderness.  Genitourinary: Rectum normal.  Musculoskeletal: Normal range of motion.  Full range of motion of left shoulder.  Lymphadenopathy:    He has no cervical adenopathy.  Neurological: He is alert and oriented to person, place, and time.  Skin: Skin is warm and dry.  Psychiatric: He has a normal mood and affect. Judgment normal.    ED Course   Procedures (including critical care time)  Labs Reviewed  CBC - Abnormal; Notable for the following:    RBC 4.10 (*)    Hemoglobin 12.5 (*)    HCT 38.2 (*)    RDW 15.9 (*)    All other components within normal limits  BASIC METABOLIC PANEL - Abnormal; Notable for the following:    Glucose, Bld 102 (*)    Creatinine, Ser 1.36 (*)    GFR calc non Af Amer 65 (*)    GFR calc Af Amer 75 (*)    All other components within normal limits   Ct Soft Tissue Neck Wo Contrast  08/08/2013   *RADIOLOGY REPORT*  Clinical Data: Left shoulder and neck pain.  Renal transplant. Diabetes.  Hypertension.  CT NECK WITHOUT CONTRAST  Technique:  Multidetector CT imaging of the neck was performed without intravenous contrast.  Comparison: None  Findings: Lack of intravenous contrast does limit sensitivity of this test.  There is diffuse arterial calcification consistent with diabetes. There is subcutaneous edema and thickening of the skin throughout the neck anteriorly  and posteriorly.  This may be due to anasarca and fluid overload.  The  lung apices are clear without edema or effusion.  Parotid and submandibular glands are normal bilaterally. Epiglottis and larynx are normal.  Thyroid is not enlarged.  Negative for pathologic adenopathy in the neck.  Cervical kyphosis.  Normal alignment.  No fracture or spinal stenosis.  IMPRESSION: There is a skin thickening and subcutaneous edema diffusely in the neck which may be due to anasarca.  Negative for abscess or mass.  No other acute abnormality is identified.   Original Report Authenticated By: Carl Best, M.D.   I personally reviewed the imaging tests through PACS system I reviewed available ER/hospitalization records through the EMR   1. Neck pain     MDM  CT scan without focal abnormality around his left supraclavicular space.  Skin is somewhat limited given the lack of IV contrast however my suspicion for acute infection or pathology in this area is low.  Patient is overall well-appearing.  Vital signs are normal.  This may be musculoskeletal type pain.  Normal vital signs in the emergency department.  Discharge home with close PCP followup.  He understands to return to the ER for new or worsening symptoms.  He reports today creatinine of 1.36 is baseline for him  Hoy Morn, MD 08/08/13 [phone]

## 2013-10-03 ENCOUNTER — Encounter: Payer: Self-pay | Admitting: Family Medicine

## 2013-10-03 ENCOUNTER — Encounter: Payer: Self-pay | Admitting: Clinical

## 2013-10-03 ENCOUNTER — Ambulatory Visit (INDEPENDENT_AMBULATORY_CARE_PROVIDER_SITE_OTHER): Payer: Medicare Other | Admitting: Family Medicine

## 2013-10-03 DIAGNOSIS — L7211 Pilar cyst: Secondary | ICD-10-CM

## 2013-10-03 NOTE — Patient Instructions (Signed)
Epidermoid and pilar cysts look like small smooth lumps under the skin surface. They are generally benign (non-cancerous) and usually cause no harm or problems. If required, they can usually be removed easily by a small operation done under local anaesthetic. The main reason why some people want them removed is for cosmetic reasons, as they can look unsightly. What are epidermoid and pilar cysts? A cyst is a sac that is filled with a fluid or semi-fluid material. Cysts develop in various places in the body and arise from different tissues in the body. Two of the most common types of cyst that occur under the skin surface are epidermoid and pilar cysts. These cysts used to be called sebaceous cysts but this term is no longer correct, as the origin of these cysts is not from the sebaceous glands in the skin (as was once thought). An epidermoid cyst is a cyst where the cyst sac forms from cells that normally occur on the top layer of the skin (the epidermis).  A pilar cyst is a cyst where the cyst sac forms from cells similar to those that are in the bottom of hair follicles (where hairs grow from). In both cases, the semi-fluid content of the cyst looks a bit like toothpaste. This substance is soggy keratin. Keratin is made by skin cells and is the substance that hairs are made from and the substance that covers the top layer of the skin. What do these cysts look like and what are their symptoms? Both epidermoid and pilar cysts are smooth round lumps which you can see and feel just beneath the skin surface. They are very common. Often they are small (pea size) but sometimes they slowly get bigger over many months to become a few centimeters in diameter. They look very similar to each other but can be distinguished from each other if the cells that form the cyst sac are looked at under the microscope. Epidermoid cysts can affect anyone but are most common in young and middle-aged adults. They can appear  anywhere on the skin but develop most commonly on the face, neck, chest, and upper back.  Pilar cysts can affect anyone but are most common in middle-aged women. They can appear anywhere on the skin but develop most commonly on the scalp. It is common for several to develop at the same time on the scalp. Both epidermoid and pilar cysts usually cause no symptoms. Occasionally: They become infected, when they may become red, inflamed and painful. A course of antibiotics will usually clear an infection if it occurs.  The cyst may leak the toothpaste-like material on to the skin if the cyst is punctured or damaged.  A little horn may grow on the skin over the cyst.  A cyst may form in an uncomfortable place such as in the genital skin or beside a nail. What causes epidermoid and pilar cysts? It seems that some cells that are normally near to the surface of the skin (cells of the epidermis or cells in hair follicles) get into deeper parts of the skin but continue to multiply. This may occur for various reasons - for example, following an injury to the skin. The cells that multiply form into a sac and produce the keratin that they would normally make on the top layer of the skin. The keratin becomes soggy and forms into a toothpaste-like substance.  A tendency to form pilar cysts runs in some families. So, there is a genetic factor in some cases.  Epidermoid cysts in themselves are not hereditary and most form for no apparent reason in healthy people. However, some people with rare syndromes have many epidermoid cysts as one of their features.  Are epidermoid and pilar cysts harmful? Not usually. If they do not bother you then it is best just to leave them alone, but it is worth getting your doctor to check it is harmless. Sometimes a person with an epidermoid or pilar cyst requests that it be removed. This is usually for one of three reasons. Cosmetic reasons. For example, the cyst is in an obvious site on  the skin and looks unsightly.  They are sometimes easy to catch and traumatise. This typically occurs on the scalp when combing hair.  If the cyst has become infected or irritating. Epidermoid and pilar cysts are benign. That is, they are not cancerous, do not spread to other parts of the body or cause any serious problems. Very rarely, a skin cancer may develop from an epidermoid or pilar cyst. Removal of epidermoid and pilar cysts If required, the cyst can usually be easily removed by a simple operation, under local anaesthetic. The surrounding skin is numbed by injecting some local anaesthetic. A small incision (cut) is made over the cyst. Typically, the cyst can be easily pulled out. The wound is then stitched up. A small scar will result. Sometimes, after the removal of a cyst, it gradually regrows in the same site under the scar. This is unusual but, if it occurs, it can be removed again

## 2013-10-03 NOTE — Assessment & Plan Note (Signed)
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  Fluctuant at times, no concern for other processes going on as denies fever, chills, erythema, weight loss, night sweats, lymphadenitis, blurred vision, N/V/D.  Information given to patient about incision and removal today and willl have him come back in 2-3 weeks for actual removal.  Will include a square block around the area, small central incision with extraction of the cyst.

## 2013-10-03 NOTE — Progress Notes (Signed)
Clinical Education officer, museum (CSW) received a referral to explore pt needs and whether he would be appropriate for a [facility] referral. CSW met with pt who stated he would like to receive services through [facility]  as he received services through them a few years ago. CSW explored pt needs and concerns. Pt stated he needs assistance with light cleaning/cooking and this is a resources that the agency provides. CSW did not gather any other concerns or needs as pt appears fairly functional, compliant with medications and receives transportation assistance through [place]. CSW informed pt that she would contact the agency and place a referral.  CSW contacted the agency and was informed that the pt would be contacted. CSW notified pt who was very Patent attorney.  Hunt Oris, MSW, [place]

## 2013-10-03 NOTE — Progress Notes (Signed)
[name] is a 37 y.o. male who presents today for lumb on the back of his head.  Head Mass - Pt noticed a fluctuant lump begin about 6-8 weeks ago, not causing him discomfort, and denies any inciting injury or trauma to his head.  Noticed it on the L occipital region initially that began as small, fluctuant, would dissipate on its own but has increased in size now over the last several weeks become more indurated.  As well it is causing him to have HA and pain in the left back of his head.  However, he denies any fever, chills, sweats, other lymphadenopathy, weight loss, fatigue, N/V/D, weakness, balance issues.    Past Medical History  Diagnosis Date  . Hypertension   . GERD (gastroesophageal reflux disease)   . Gastropathy   . Chronic pain   . Depression   . Edema   . Headache(784.0)   . 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 Dialysis patient 04/18/12    "[facility]; [place], [place], [place]"  . Blood transfusion   . Arthritis     "I think I do; just in my fingers & my hands"  . Type I diabetes mellitus     "juvenile"  . Gastroparesis   . ESRD (end stage renal disease) on dialysis     History  Smoking status  . Never Smoker   Smokeless tobacco  . Never Used    Family History  Problem Relation Age of Onset  . Diabetes Other   . Hypertension Other   . Heart disease Other     Current Outpatient Prescriptions on File Prior to Visit  Medication Sig Dispense Refill  . aspirin EC 325 MG tablet 325 mg. Take 1 tablet (325 mg total) by mouth daily.      . cloNIDine (CATAPRES) 0.1 MG tablet Take 0.1 mg by mouth at bedtime as needed (for high blood pressure).      [name] HYDROcodone-acetaminophen (NORCO/VICODIN) 5-325 MG per tablet Take 1 tablet by mouth every 4 (four) hours as needed for pain.  15 tablet  0  . labetalol (NORMODYNE) 200 MG tablet Take 1 tablet (200 mg total) by mouth 2 (two) times daily.  60 tablet  5  . mycophenolate (MYFORTIC) 180 MG EC tablet  Take 540 mg by mouth 2 (two) times daily.       [name] omeprazole (PRILOSEC) 20 MG capsule Take 20 mg by mouth 2 (two) times daily.      [name] oxyCODONE-acetaminophen (PERCOCET/ROXICET) 5-325 MG per tablet Take 1 tablet by mouth every 4 (four) hours as needed for pain.      . phosphorus (K PHOS NEUTRAL) 155-852-130 MG tablet Take 500 mg by mouth 2 (two) times daily. Take 2 tablets (500 mg total) by mouth 2 times daily.      . predniSONE (DELTASONE) 5 MG tablet Take 5 mg by mouth daily.       . tacrolimus (PROGRAF) 1 MG capsule Take 6-7 mg by mouth 2 (two) times daily. Take 7 capsules in the morning and 6 every evening      . valGANciclovir (VALCYTE) 450 MG tablet 450 mg. Take 1 tablet (450 mg total) by mouth daily.       No current facility-administered medications on file prior to visit.    ROS: Per HPI.  All other systems reviewed and are negative.   Physical Exam Filed Vitals:   10/03/13 0912  BP: 147/92  Pulse: 80  Physical Examination: General appearance - alert, well appearing, and in no distress Eyes - left eye normal, right eye normal Mouth - mucous membranes moist, pharynx normal without lesions Lymph: No occipital lymphadenopathy, supraclavicular, infraclavicular, axillary LAD noted.   Head: + 2 x 2 semi fluctuant, mobile cyst left occipital region.  No TTP, no erythema, no edema.

## 2013-10-04 ENCOUNTER — Telehealth: Payer: Self-pay | Admitting: Clinical

## 2013-10-04 NOTE — Telephone Encounter (Signed)
Clinical Education officer, museum (CSW) contacted pt and informed him that a Publishing rights manager (PCS) form will need to be completed by pt PCP in order for pt to receive PCS services. Once the form is faxed to [facility], a home visit will be done by a rep from [employer] to confirm pt needs and at that time pt will be able to choose the provider [facility]?) he desires. Pt very appreciative of assistance.  PCS form placed in Dr. Awanda Mink box for completion.  Hunt Oris, MSW, [place]

## 2013-12-04 ENCOUNTER — Telehealth: Payer: Self-pay | Admitting: Family Medicine

## 2013-12-04 NOTE — Telephone Encounter (Signed)
Attempted to call pt in regards to a form we had received to refill his diabetic supply.  I attempted to call him to verify if he is still checking his blood sugars as he has had a pancreatic transplant and last visit we discussed about this, he had told me he would no longer need to check his sugars.  LVM to call us back and verify if he is or is not checking his blood sugars daily.  Tamela Oddi Awanda Mink, DO of [facility] 12/04/2013, 10:37 AM

## 2013-12-05 ENCOUNTER — Telehealth: Payer: Self-pay | Admitting: Family Medicine

## 2013-12-05 NOTE — Telephone Encounter (Signed)
Looks like [name] does not need the test strips.  Please let me know what you want me to do about the form.  Thanks, Gaspar Bidding

## 2013-12-05 NOTE — Telephone Encounter (Signed)
Pt is returning Dr. Mena Pauls call. He does not check his blood sugar anymore. jw

## 2014-01-28 ENCOUNTER — Telehealth: Payer: Self-pay | Admitting: *Deleted

## 2014-01-28 NOTE — Telephone Encounter (Signed)
April from [facility] called to request NPI number for patient.  Pt has appt for follow-up in 1-2 months for glaucoma.  NPI given x 3 visits. Derl Barrow, RN

## 2014-02-24 ENCOUNTER — Emergency Department (HOSPITAL_COMMUNITY): Payer: Medicare Other

## 2014-02-24 ENCOUNTER — Inpatient Hospital Stay (HOSPITAL_COMMUNITY)
Admission: EM | Admit: 2014-02-24 | Discharge: 2014-02-25 | DRG: 391 | Disposition: A | Discharge status: Home / Self Care | Payer: Medicare Other | Attending: Family Medicine | Admitting: Family Medicine

## 2014-02-24 ENCOUNTER — Encounter (HOSPITAL_COMMUNITY): Payer: Self-pay | Admitting: Emergency Medicine

## 2014-02-24 DIAGNOSIS — G8929 Other chronic pain: Secondary | ICD-10-CM | POA: Diagnosis present

## 2014-02-24 DIAGNOSIS — E1165 Type 2 diabetes mellitus with hyperglycemia: Secondary | ICD-10-CM

## 2014-02-24 DIAGNOSIS — Z833 Family history of diabetes mellitus: Secondary | ICD-10-CM

## 2014-02-24 DIAGNOSIS — Z940 Kidney transplant status: Secondary | ICD-10-CM

## 2014-02-24 DIAGNOSIS — F4310 Post-traumatic stress disorder, unspecified: Secondary | ICD-10-CM | POA: Diagnosis present

## 2014-02-24 DIAGNOSIS — F1210 Cannabis abuse, uncomplicated: Secondary | ICD-10-CM | POA: Diagnosis present

## 2014-02-24 DIAGNOSIS — Z7982 Long term (current) use of aspirin: Secondary | ICD-10-CM

## 2014-02-24 DIAGNOSIS — M899 Disorder of bone, unspecified: Secondary | ICD-10-CM | POA: Diagnosis present

## 2014-02-24 DIAGNOSIS — Z992 Dependence on renal dialysis: Secondary | ICD-10-CM

## 2014-02-24 DIAGNOSIS — R112 Nausea with vomiting, unspecified: Secondary | ICD-10-CM

## 2014-02-24 DIAGNOSIS — E785 Hyperlipidemia, unspecified: Secondary | ICD-10-CM | POA: Diagnosis present

## 2014-02-24 DIAGNOSIS — E1029 Type 1 diabetes mellitus with other diabetic kidney complication: Secondary | ICD-10-CM | POA: Diagnosis present

## 2014-02-24 DIAGNOSIS — E1143 Type 2 diabetes mellitus with diabetic autonomic (poly)neuropathy: Secondary | ICD-10-CM

## 2014-02-24 DIAGNOSIS — IMO0002 Reserved for concepts with insufficient information to code with codable children: Secondary | ICD-10-CM

## 2014-02-24 DIAGNOSIS — K219 Gastro-esophageal reflux disease without esophagitis: Secondary | ICD-10-CM | POA: Diagnosis present

## 2014-02-24 DIAGNOSIS — E1149 Type 2 diabetes mellitus with other diabetic neurological complication: Secondary | ICD-10-CM

## 2014-02-24 DIAGNOSIS — F339 Major depressive disorder, recurrent, unspecified: Secondary | ICD-10-CM | POA: Diagnosis present

## 2014-02-24 DIAGNOSIS — E1049 Type 1 diabetes mellitus with other diabetic neurological complication: Secondary | ICD-10-CM | POA: Diagnosis present

## 2014-02-24 DIAGNOSIS — R1115 Cyclical vomiting syndrome unrelated to migraine: Principal | ICD-10-CM | POA: Diagnosis present

## 2014-02-24 DIAGNOSIS — Z8249 Family history of ischemic heart disease and other diseases of the circulatory system: Secondary | ICD-10-CM

## 2014-02-24 DIAGNOSIS — M949 Disorder of cartilage, unspecified: Secondary | ICD-10-CM

## 2014-02-24 DIAGNOSIS — Z79899 Other long term (current) drug therapy: Secondary | ICD-10-CM

## 2014-02-24 DIAGNOSIS — N186 End stage renal disease: Secondary | ICD-10-CM | POA: Diagnosis present

## 2014-02-24 DIAGNOSIS — K319 Disease of stomach and duodenum, unspecified: Secondary | ICD-10-CM | POA: Diagnosis present

## 2014-02-24 DIAGNOSIS — IMO0001 Reserved for inherently not codable concepts without codable children: Secondary | ICD-10-CM

## 2014-02-24 DIAGNOSIS — S88119A Complete traumatic amputation at level between knee and ankle, unspecified lower leg, initial encounter: Secondary | ICD-10-CM

## 2014-02-24 DIAGNOSIS — K3184 Gastroparesis: Secondary | ICD-10-CM

## 2014-02-24 DIAGNOSIS — I120 Hypertensive chronic kidney disease with stage 5 chronic kidney disease or end stage renal disease: Secondary | ICD-10-CM | POA: Diagnosis present

## 2014-02-24 DIAGNOSIS — M19049 Primary osteoarthritis, unspecified hand: Secondary | ICD-10-CM | POA: Diagnosis present

## 2014-02-24 DIAGNOSIS — Z9483 Pancreas transplant status: Secondary | ICD-10-CM

## 2014-02-24 LAB — URINALYSIS, ROUTINE W REFLEX MICROSCOPIC
Bilirubin Urine: NEGATIVE
GLUCOSE, UA: NEGATIVE mg/dL
Hgb urine dipstick: NEGATIVE
KETONES UR: NEGATIVE mg/dL
NITRITE: NEGATIVE
PH: 6.5 (ref 5.0–8.0)
Protein, ur: 30 mg/dL — AB
SPECIFIC GRAVITY, URINE: 1.02 (ref 1.005–1.030)
Urobilinogen, UA: 1 mg/dL (ref 0.0–1.0)

## 2014-02-24 LAB — URINE MICROSCOPIC-ADD ON

## 2014-02-24 LAB — CBC
HCT: 50.8 % (ref 39.0–52.0)
Hemoglobin: 17 g/dL (ref 13.0–17.0)
MCH: 31.6 pg (ref 26.0–34.0)
MCHC: 33.5 g/dL (ref 30.0–36.0)
MCV: 94.4 fL (ref 78.0–100.0)
PLATELETS: 291 10*3/uL (ref 150–400)
RBC: 5.38 MIL/uL (ref 4.22–5.81)
RDW: 14.1 % (ref 11.5–15.5)
WBC: 6.9 10*3/uL (ref 4.0–10.5)

## 2014-02-24 LAB — COMPREHENSIVE METABOLIC PANEL
ALBUMIN: 3.3 g/dL — AB (ref 3.5–5.2)
ALK PHOS: 184 U/L — AB (ref 39–117)
ALT: 15 U/L (ref 0–53)
AST: 12 U/L (ref 0–37)
BILIRUBIN TOTAL: 0.3 mg/dL (ref 0.3–1.2)
BUN: 11 mg/dL (ref 6–23)
CHLORIDE: 104 meq/L (ref 96–112)
CO2: 23 meq/L (ref 19–32)
Calcium: 9.4 mg/dL (ref 8.4–10.5)
Creatinine, Ser: 1 mg/dL (ref 0.50–1.35)
GFR calc Af Amer: >90 mL/min (ref >90)
Glucose, Bld: 105 mg/dL — ABNORMAL HIGH (ref 70–99)
POTASSIUM: 3.9 meq/L (ref 3.7–5.3)
Sodium: 140 mEq/L (ref 137–147)
Total Protein: 8.2 g/dL (ref 6.0–8.3)

## 2014-02-24 LAB — MRSA PCR SCREENING: MRSA by PCR: NEGATIVE

## 2014-02-24 LAB — ETHANOL: Alcohol, Ethyl (B): <11 mg/dL (ref 0–11)

## 2014-02-24 LAB — LIPASE, BLOOD: LIPASE: 66 U/L — AB (ref 11–59)

## 2014-02-24 MED ORDER — HYDRALAZINE HCL 20 MG/ML IJ SOLN
10.0000 mg | Freq: Once | INTRAMUSCULAR | Status: AC
  Administered 2014-02-24: 10 mg via INTRAVENOUS
  Filled 2014-02-24: qty 1

## 2014-02-24 MED ORDER — SODIUM CHLORIDE 0.9 % IV BOLUS (SEPSIS)
1000.0000 mL | Freq: Once | INTRAVENOUS | Status: AC
  Administered 2014-02-24: 1000 mL via INTRAVENOUS

## 2014-02-24 MED ORDER — PROMETHAZINE HCL 25 MG/ML IJ SOLN
12.5000 mg | INTRAMUSCULAR | Status: DC | PRN
  Administered 2014-02-24 – 2014-02-25 (×3): 12.5 mg via INTRAVENOUS
  Filled 2014-02-24 (×3): qty 1

## 2014-02-24 MED ORDER — ONDANSETRON 4 MG PO TBDP
8.0000 mg | ORAL_TABLET | Freq: Once | ORAL | Status: DC
  Filled 2014-02-24: qty 2

## 2014-02-24 MED ORDER — SODIUM CHLORIDE 0.9 % IV SOLN
INTRAVENOUS | Status: AC
  Administered 2014-02-25: 02:00:00 via INTRAVENOUS

## 2014-02-24 MED ORDER — PREDNISONE 5 MG PO TABS
5.0000 mg | ORAL_TABLET | Freq: Every day | ORAL | Status: DC
  Administered 2014-02-25: 5 mg via ORAL
  Filled 2014-02-24: qty 1

## 2014-02-24 MED ORDER — ONDANSETRON HCL 4 MG/2ML IJ SOLN
4.0000 mg | Freq: Four times a day (QID) | INTRAMUSCULAR | Status: AC | PRN
  Administered 2014-02-24: 4 mg via INTRAVENOUS
  Filled 2014-02-24: qty 2

## 2014-02-24 MED ORDER — LABETALOL HCL 5 MG/ML IV SOLN
20.0000 mg | Freq: Once | INTRAVENOUS | Status: AC
  Administered 2014-02-24: 20 mg via INTRAVENOUS
  Filled 2014-02-24: qty 4

## 2014-02-24 MED ORDER — HYDROCORTISONE NA SUCCINATE PF 100 MG IJ SOLR
100.0000 mg | Freq: Once | INTRAMUSCULAR | Status: AC
  Administered 2014-02-24: 100 mg via INTRAVENOUS
  Filled 2014-02-24: qty 2

## 2014-02-24 MED ORDER — TACROLIMUS 1 MG PO CAPS
1.0000 mg | ORAL_CAPSULE | Freq: Every evening | ORAL | Status: DC
  Administered 2014-02-24: 1 mg via ORAL
  Filled 2014-02-24 (×2): qty 1

## 2014-02-24 MED ORDER — PANTOPRAZOLE SODIUM 40 MG IV SOLR
40.0000 mg | Freq: Once | INTRAVENOUS | Status: AC
  Administered 2014-02-24: 40 mg via INTRAVENOUS
  Filled 2014-02-24: qty 40

## 2014-02-24 MED ORDER — TACROLIMUS 1 MG PO CAPS
2.0000 mg | ORAL_CAPSULE | Freq: Every morning | ORAL | Status: DC
  Administered 2014-02-25: 2 mg via ORAL
  Filled 2014-02-24: qty 2

## 2014-02-24 MED ORDER — HYDRALAZINE HCL 20 MG/ML IJ SOLN: 10.0000 mg | Freq: Four times a day (QID) | INTRAMUSCULAR | Status: DC | PRN

## 2014-02-24 MED ORDER — METOCLOPRAMIDE HCL 5 MG/ML IJ SOLN
10.0000 mg | Freq: Once | INTRAMUSCULAR | Status: AC
  Administered 2014-02-24: 10 mg via INTRAVENOUS
  Filled 2014-02-24: qty 2

## 2014-02-24 MED ORDER — SODIUM CHLORIDE 0.9 % IV SOLN
INTRAVENOUS | Status: AC
  Administered 2014-02-24: 19:00:00 via INTRAVENOUS

## 2014-02-24 MED ORDER — MYCOPHENOLATE SODIUM 180 MG PO TBEC
720.0000 mg | DELAYED_RELEASE_TABLET | Freq: Two times a day (BID) | ORAL | Status: DC
  Administered 2014-02-24 – 2014-02-25 (×2): 720 mg via ORAL
  Filled 2014-02-24 (×3): qty 4

## 2014-02-24 MED ORDER — ONDANSETRON HCL 4 MG/2ML IJ SOLN
4.0000 mg | Freq: Once | INTRAMUSCULAR | Status: AC
  Administered 2014-02-24: 4 mg via INTRAVENOUS
  Filled 2014-02-24: qty 2

## 2014-02-24 MED ORDER — SULFAMETHOXAZOLE-TRIMETHOPRIM 400-80 MG PO TABS
1.0000 | ORAL_TABLET | ORAL | Status: DC
  Filled 2014-02-24: qty 1

## 2014-02-24 MED ORDER — PANTOPRAZOLE SODIUM 40 MG IV SOLR
40.0000 mg | INTRAVENOUS | Status: DC
  Administered 2014-02-25: 40 mg via INTRAVENOUS
  Filled 2014-02-24: qty 40

## 2014-02-24 MED ORDER — METOCLOPRAMIDE HCL 5 MG/ML IJ SOLN
10.0000 mg | Freq: Every day | INTRAMUSCULAR | Status: DC
  Administered 2014-02-25: 10 mg via INTRAVENOUS
  Filled 2014-02-24: qty 2

## 2014-02-24 MED ORDER — TACROLIMUS 1 MG PO CAPS: 1.0000 mg | ORAL_CAPSULE | Freq: Two times a day (BID) | ORAL | Status: DC

## 2014-02-24 NOTE — ED Provider Notes (Addendum)
CSN: [id number]     Arrival date & time 02/24/14  1145 History   First MD Initiated Contact with Patient 02/24/14 1147     Chief Complaint  Patient presents with  . Vomiting     (Consider location/radiation/quality/duration/timing/severity/associated sxs/prior Treatment) The history is provided by the patient and a relative.  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 Several episodes of emesis, clear to yellowish, ?streak blood in last emesis. Having normal bms, sl loose c/w baseline, no diarrhea or constipation. Denies abd pain. No known ill contacts, recent travel, bad food ingestion, change in meds, or recent abx use.  States compliant w normal meds, no recent change in meds or doses. Denies fever or chills.  No cp or sob. No cough or uri c/o. No dysuria. Urinating regularly. No back or flank pain.      Past Medical History  Diagnosis Date  . Hypertension   . GERD (gastroesophageal reflux disease)   . Gastropathy   . Chronic pain   . Depression   . Edema   . Headache(784.0)   . 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 Dialysis patient 04/18/12    "[facility]; [place], [place], [place]"  . Blood transfusion   . Arthritis     "I think I do; just in my fingers & my hands"  . Type I diabetes mellitus     "juvenile"  . Gastroparesis   . ESRD (end stage renal disease) on dialysis    Past Surgical History  Procedure Laterality Date  . Below knee leg amputation  "it's been awhile"    bilaterally  . Cataract extraction  ~ 2011    right  . Av fistula placement  08/2011    left upper arm  . Combined kidney-pancreas transplant     Family History  Problem Relation Age of Onset  . Diabetes Other   . Hypertension Other   . Heart disease Other    History  Substance Use Topics  . Smoking status: Never Smoker   . Smokeless tobacco: Never Used  . Alcohol Use: Yes     Review of Systems  Constitutional: Negative for fever.  HENT: Negative for sore throat.   Eyes: Negative for redness.  Respiratory: Negative for cough and shortness of breath.   Cardiovascular: Negative for chest pain.  Gastrointestinal: Positive for nausea and vomiting. Negative for abdominal pain, diarrhea and constipation.  Endocrine: Negative for polyuria.  Genitourinary: Negative for dysuria and flank pain.  Musculoskeletal: Negative for back pain and neck pain.  Skin: Negative for rash.  Neurological: Negative for headaches.  Hematological: Does not bruise/bleed easily.  Psychiatric/Behavioral: Negative for confusion.      Allergies  Review of patient's allergies indicates no known allergies.  Home Medications   Current Outpatient Rx  Name  Route  Sig  Dispense  Refill  . aspirin EC 325 MG tablet      325 mg. Take 1 tablet (325 mg total) by mouth daily.         . cloNIDine (CATAPRES) 0.1 MG tablet   Oral   Take 0.1 mg by mouth at bedtime as needed (for high blood pressure).         [name] HYDROcodone-acetaminophen (NORCO/VICODIN) 5-325 MG per tablet   Oral   Take 1 tablet by mouth every 4 (four) hours as needed for pain.   15 tablet  0   . labetalol (NORMODYNE) 200 MG tablet   Oral   Take 1 tablet (200 mg total) by mouth 2 (two) times daily.   60 tablet   5   . mycophenolate (MYFORTIC) 180 MG EC tablet   Oral   Take 540 mg by mouth 2 (two) times daily.          [name] omeprazole (PRILOSEC) 20 MG capsule   Oral   Take 20 mg by mouth 2 (two) times daily.         [name] oxyCODONE-acetaminophen (PERCOCET/ROXICET) 5-325 MG per tablet   Oral   Take 1 tablet by mouth every 4 (four) hours as needed for pain.         . phosphorus (K PHOS NEUTRAL) 155-852-130 MG tablet   Oral   Take 500 mg by mouth 2 (two) times daily. Take 2 tablets (500 mg total) by mouth 2 times daily.         . predniSONE (DELTASONE) 5 MG tablet   Oral   Take 5 mg by mouth daily.           . tacrolimus (PROGRAF) 1 MG capsule   Oral   Take 6-7 mg by mouth 2 (two) times daily. Take 7 capsules in the morning and 6 every evening         . valGANciclovir (VALCYTE) 450 MG tablet      450 mg. Take 1 tablet (450 mg total) by mouth daily.          There were no vitals taken for this visit. Physical Exam  Nursing note and vitals reviewed. Constitutional: He is oriented to person, place, and time. He appears well-developed and well-nourished.  HENT:  Head: Atraumatic.  Mouth/Throat: Oropharynx is clear and moist.  Eyes: Conjunctivae are normal. No scleral icterus.  Neck: Neck supple. No tracheal deviation present.  Cardiovascular: Normal rate, regular rhythm, normal heart sounds and intact distal pulses.   Pulmonary/Chest: Effort normal and breath sounds normal. No accessory muscle usage. No respiratory distress.  Abdominal: Soft. Bowel sounds are normal. He exhibits no distension and no mass. There is no rebound and no guarding.  Mild epigastric tenderness. No incarc hernia.   Genitourinary:  No cva tenderness.   Musculoskeletal: Normal range of motion. He exhibits no edema.  Neurological: He is alert and oriented to person, place, and time.  Skin: Skin is warm and dry. He is not diaphoretic.  Psychiatric: He has a normal mood and affect.    ED Course  Procedures (including critical care time)   Results for orders placed during the hospital encounter of 02/24/14  COMPREHENSIVE METABOLIC PANEL      Result Value Ref Range   Sodium 140  137 - 147 mEq/L   Potassium 3.9  3.7 - 5.3 mEq/L   Chloride 104  96 - 112 mEq/L   CO2 23  19 - 32 mEq/L   Glucose, Bld 105 (*) 70 - 99 mg/dL   BUN 11  6 - 23 mg/dL   Creatinine, Ser 1.00  0.50 - 1.35 mg/dL   Calcium 9.4  8.4 - 10.5 mg/dL   Total Protein 8.2  6.0 - 8.3 g/dL   Albumin 3.3 (*) 3.5 - 5.2 g/dL   AST 12  0 - 37 U/L   ALT 15  0 - 53 U/L   Alkaline Phosphatase 184 (*) 39 - 117 U/L   Total Bilirubin 0.3  0.3 -  1.2 mg/dL   GFR calc  non Af Amer >90  >90 mL/min   GFR calc Af Amer >90  >90 mL/min  CBC      Result Value Ref Range   WBC 6.9  4.0 - 10.5 K/uL   RBC 5.38  4.22 - 5.81 MIL/uL   Hemoglobin 17.0  13.0 - 17.0 g/dL   HCT 50.8  39.0 - 52.0 %   MCV 94.4  78.0 - 100.0 fL   MCH 31.6  26.0 - 34.0 pg   MCHC 33.5  30.0 - 36.0 g/dL   RDW 14.1  11.5 - 15.5 %   Platelets 291  150 - 400 K/uL  LIPASE, BLOOD      Result Value Ref Range   Lipase 66 (*) 11 - 59 U/L   Dg Abd 1 View  02/24/2014   CLINICAL DATA:  Pain, vomiting.  EXAM: ABDOMEN - 1 VIEW  COMPARISON:  CT 08/11/2012  FINDINGS: Dense vascular calcifications noted in the upper abdomen. Nonobstructive bowel gas pattern. No free air organomegaly. No suspicious calcification.  IMPRESSION: Extensive vascular calcifications. No evidence of bowel obstruction or free air.   Electronically Signed   By: Rolm Baptise M.D.   On: 02/24/2014 13:19      MDM  Iv ns bolus. zofran iv.   Labs.  Reviewed nursing notes and prior charts for additional history.   Prior charts, ?hx gastroparesis.  reglan iv.  protonix iv.  zofran iv.  Additional ns.    Persistent nausea, unable to tolerate po.  Given persistent nausea, hx kidney/panc txplant, will admit for obs, ivf, nausea med.  Pt on chronic steroids, as hasnt tolerated in past day, nv, will give dose solucortef.  Pt/family state pcp is [facility] - will call for admission.   Recheck abd soft nt/nd.   Discussed w roc who requests temp orders to Dr Lindell Noe.     Mirna Mires, MD 02/24/14 [phone]

## 2014-02-24 NOTE — ED Notes (Signed)
Lab unable to draw labs due to fluid admin. EDP will be consulted for pause of fluids for 82min.

## 2014-02-24 NOTE — H&P (Signed)
[facility] Admission History and Physical Service Pager: [phone]  Patient name: AURORA STEINHAUS Medical record number: [id number] Date of birth: March 19, 1976 Age: 38 y.o. Gender: male  Primary Care Provider: Kennith Maes, DO Transplant Team - [facility] see again in June  [facility] Consultants: None Code Status: Full, need to clarify with patient.  Chief Complaint: Intractable nausea / vomiting  Assessment and Plan: MOREY FRERICHS is a 38 y.o. male presenting with intractable nausea and vomiting. 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  # Intractable nausea and vomiting - Present 1 day with no abdominal pain, fevers, or chills. Pt has h/o diabetic gastroparesis with multiple admissions but none since his transplants 05/2013. Also has daily use of marijuana to self-treat pain. KUB negative for acute process.  DDx includes diabetic gastroparesis, cyclic vomiting, and pancreatitis (mild lipase increase though unsure of baseline with pancreas transplant) or viral gastroenteritis; the last two are less likely given no fever, abdominal pain/tenderness, or elevated WBC. In the ED, reglan 10mg  IV x 1, Zofran 4mg  IVx 1, protonix 40mg  iv x 1, nacl 1l bolus x 2. - Admit to inpatient, FPTS attending Dr Lindell Noe. - Clear liquid diet, IV fluids. - Continue IV zofran PRN, PPI and reglan 10mg  IV daily; added IV phenergan PRN. - Will need counseling prior to discharge on marijuana use link to cyclic vomiting. - UDS and blood alcohol level. - If nausea not improved with phenergan, change to compazine. - Consider CT abdomen if abdominal tenderness develops.  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  Has not taken anti-rejection medications since last night's dose (prednisone 5mg  daily, mycophenolate 720mg  PO BID,  prograf 2mg  qAM and 1mg  qPM, bactrim 400-80mg  three times weekly). Denies abdominal pain or fever.  Dosed solu-cortef 100mg  IV x 1 in the ED. - Will order PO anti-rejection medications and asked pt to let nurse know when not nauseated and nurse to communicate with me if he is unable to take tonight. (no safe IV formulation per discussion with pharmacy). - Will touch base with pt's transplant doctor [facility]) tomorrow for further recs especially if unable to tolerate PO another day. - Consult renal ([facility]). - AM CBC and CMET. - Reports he saw transplant team 2 weeks ago at [facility] and sees them again in June.  # HTN - BP very elevated in ED to 200s/120s, likely partially elevated due to discomfort. Improved to 135/85 s/p hydralazine 10mg  IV x 1, labetalol 20mg  IV x 1, and zofran for nausea. - Hydralazine 10mg  IV q4hrs prn SBP>180 or DBP>110. - Consider adding agent prior to discharge.  # H/o diabetes - No A1c recently, last 12.3 02/2013 (prior to transplant) - Recheck A1c and lipid profile given poor f/u.  FEN/GI: NS at 125cc/hr, clear liquid diet Prophylaxis: Given coffee ground emesis report, will hold pharm VTE ppx and use SCDs above knees and compression stockings.  Disposition: Admit to inpatient, discharge pending improvement of nausea/vomiting.  History of Present Illness: OCTAVIO MILTON is a 38 y.o. male presenting with intractable nausea and vomiting. He reports that this morning he started having these symptoms. He presented to the ED where he had coffee-ground emesis and four episodes of emesis since arriving to the floor. He had zofran at 5pm which did not help much. He has never had symptoms like this since the  transplant of his kidney and pancreas. Before that, he did have symptoms similar to this but worse, presumed from diabetic gastroparesis. He denies fevers or abdominal pain or diarrhea. No sick contacts. Denies headache, chest pain, or other symptoms.    Dr Clover Mealy with [facility] follows his renal/pancreatic transplant. Patient reports taking medications for kidney regularly (prograf, myfortic, prednisone, bactrim) but missed this morning's dose due to nausea/emesis. He had multiple admissions in the past for issues with diabetes control and nausea/emesis from chronic diabetic gastroparesis, most recently 07/2012.  Review Of Systems: Per HPI with the following additions: None. Otherwise 12 point review of systems was performed and was unremarkable.  Patient Active Problem List   Diagnosis Date Noted  . Intractable nausea and vomiting 02/24/2014  . Pilar cyst 10/03/2013  . History of transplantation, renal 07/19/2013  . History of pancreas transplant 07/19/2013  . Hypoglycemia 08/22/2012  . BKA stump complication 123456  . S/P bilateral BKA (below knee amputation) 08/17/2012  . Diabetic gastroparesis 07/20/2011  . Metabolic bone disease 99991111  . ESRD (end stage renal disease) 11/17/2009  . GERD 03/14/2007  . DM (diabetes mellitus), type 2, uncontrolled 02/22/2007  . HYPERLIPIDEMIA 02/22/2007  . DEPRESSION, MAJOR, RECURRENT 02/22/2007  . POST TRAUMATIC STRESS DISORDER 02/22/2007  . HYPERTENSION, BENIGN SYSTEMIC 02/22/2007  . 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 Hypertension   . GERD (gastroesophageal reflux disease)   . Gastropathy   . Chronic pain   . Depression   . Edema   . Headache(784.0)   . 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 Dialysis patient 04/18/12    "[facility]; [place], [place], [place]"  . Blood transfusion   . Arthritis     "I think I do; just in my fingers & my hands"  . Type I diabetes mellitus     "juvenile"  . Gastroparesis   . ESRD (end stage renal disease) on dialysis    Past Surgical History: Past Surgical History  Procedure Laterality Date  . Below knee leg amputation  "it's been awhile"    bilaterally  .  Cataract extraction  ~ 2011    right  . Av fistula placement  08/2011    left upper arm  . Combined kidney-pancreas transplant     Social History: History  Substance Use Topics  . Smoking status: Never Smoker   . Smokeless tobacco: Never Used  . Alcohol Use: Yes  Lives alone Marijuana - daily for pain No etoh, tobacco, or other drug use per patient  Additional social history: None.  Please also refer to relevant sections of EMR.  Family History: Family History  Problem Relation Age of Onset  . Diabetes Other   . Hypertension Other   . Heart disease Other    Allergies and Medications: No Known Allergies No current facility-administered medications on file prior to encounter.   Current Outpatient Prescriptions on File Prior to Encounter  Medication Sig Dispense Refill  . mycophenolate (MYFORTIC) 180 MG EC tablet Take 720 mg by mouth 2 (two) times daily.       [name] omeprazole (PRILOSEC) 20 MG capsule Take 20 mg by mouth daily.       . predniSONE (DELTASONE) 5 MG tablet Take 5 mg by mouth daily.       . tacrolimus (PROGRAF) 1 MG capsule Take 1-2 mg by mouth 2 (two) times daily. Take 2 capsules by mouth in the AM and  1 capsule in the evening      Bactrim MWF. Stopped taking prilosec.  Objective: BP 138/85  Pulse 107  Temp(Src) 99.4 F (37.4 C) (Oral)  Resp 16  Ht 6\' 1"  (1.854 m)  Wt 151 lb 9.6 oz (68.765 kg)  BMI 20.01 kg/m2  SpO2 100% Exam: General: NAD, irritable HEENT: AT/[place], sclera clear, EOMI Cardiovascular: RRR no m/r/g, 1+ bilateral radial pulses Respiratory: CTAB, no wheezes or crackles Abdomen: soft, nontender, nondistended, hypoactive bowel sounds, pt refuses further abdominal exam  Extremities: Bilateral BKA, stumps warm and well-perfused, no LE edema Skin: No rash or cyanosis; tattooed throughout Neuro: Awake, alert, no focal deficit  Labs and Imaging: CBC BMET   Recent Labs Lab 02/24/14 1155  WBC 6.9  HGB 17.0  HCT 50.8  PLT 291    Recent  Labs Lab 02/24/14 1155  NA 140  K 3.9  CL 104  CO2 23  BUN 11  CREATININE 1.00  GLUCOSE 105*  CALCIUM 9.4     Dg Abd 1 view: IMPRESSION:  Extensive vascular calcifications. No evidence of bowel obstruction  or free air.  UA: 30 protein, small leukocytes   Hilton Sinclair, MD 02/24/2014, 9:08 PM PGY-2, [place] Intern pager: [phone], text pages welcome  FMTS Attending Admit Note Patient seen and examined by me, discussed with resident team and I agree with Dr Landry Corporal assessment and plan. Briefly, a 11yoM who underwent renal-pancreas dual transplant in June 2014 at [facility], who presents with acute onset nausea and vomiting that started this morning around 10am.  He had some diarrhea yesterday, before starting with the N/V.  Denies fevers or chills, denies cough, shortness of breath, dysuria or chest pain.   He has DM type II, with most recent A1C over 12% about eleven months ago.  His glucose on admission is around 100, with a CO2 of 23 which is not suggestive of ketoacidosis as cause of GI symptoms.  Consider gastroparesis, viral gastroenteritis, or other GI process. KUB done in ED is not suggestive of obstruction.   Plan for IV fluid rehydration; antiemetics; resume anti-rejection medications.  His BP initially was quite high, has since normalized.  Transaminases and lipase are relatively unremarkable. Would consider further abdominal imaging if he begins with abdominal pain or focalized complaints.  Aside from his transplants, he reports that he has had no other abdominal surgeries.  Dalbert Mayotte, MD

## 2014-02-24 NOTE — ED Notes (Addendum)
IV fluids paused. LAB Ulylisa made aware to come back in 20 min.

## 2014-02-24 NOTE — ED Notes (Signed)
Pt at xray. Family comfortable at bedside eating.

## 2014-02-24 NOTE — ED Notes (Signed)
EDP made aware of pts n/v returning.

## 2014-02-24 NOTE — ED Notes (Signed)
38 yo male via EMS with N/V no abd pain. 3 occurences of Emesis looks red/brown, no LOC, dizziness, little diarrhea over the past couple of days. [facility] 4 MG zofran,  NSR. Pancreas and Kidney Transplant unable to take anti-rejection meds   VITALS 210/113 93 98% RA CBG 147

## 2014-02-24 NOTE — ED Notes (Signed)
Restarted fluids.

## 2014-02-24 NOTE — Progress Notes (Signed)
02/24/2014 patient came from emergency room to 6east, alert, oriented and have bilateral bka. Patient does have prosthesis in room. Patient have tattoos on arms and right shoulder blade. He have scab on right knee and stumps. Left stump on the inner side of knee spill hot water while making tea yesterday the area have purplish blister area. Patient also vomited 75cc of yellow emesis. [facility] RN.

## 2014-02-25 ENCOUNTER — Encounter (HOSPITAL_COMMUNITY): Payer: Self-pay | Admitting: General Practice

## 2014-02-25 LAB — COMPREHENSIVE METABOLIC PANEL
ALT: 11 U/L (ref 0–53)
AST: 10 U/L (ref 0–37)
Albumin: 2.8 g/dL — ABNORMAL LOW (ref 3.5–5.2)
Alkaline Phosphatase: 151 U/L — ABNORMAL HIGH (ref 39–117)
BUN: 11 mg/dL (ref 6–23)
CO2: 24 mEq/L (ref 19–32)
Calcium: 8.8 mg/dL (ref 8.4–10.5)
Chloride: 110 mEq/L (ref 96–112)
Creatinine, Ser: 1.11 mg/dL (ref 0.50–1.35)
GFR calc Af Amer: >90 mL/min (ref >90)
GFR calc non Af Amer: 82 mL/min — ABNORMAL LOW (ref >90)
Glucose, Bld: 89 mg/dL (ref 70–99)
POTASSIUM: 3.7 meq/L (ref 3.7–5.3)
SODIUM: 145 meq/L (ref 137–147)
TOTAL PROTEIN: 6.7 g/dL (ref 6.0–8.3)
Total Bilirubin: 0.4 mg/dL (ref 0.3–1.2)

## 2014-02-25 LAB — RAPID URINE DRUG SCREEN, HOSP PERFORMED
AMPHETAMINES: NOT DETECTED
BARBITURATES: NOT DETECTED
Benzodiazepines: NOT DETECTED
Cocaine: NOT DETECTED
Opiates: NOT DETECTED
Tetrahydrocannabinol: POSITIVE — AB

## 2014-02-25 LAB — CBC
HEMATOCRIT: 46.3 % (ref 39.0–52.0)
HEMOGLOBIN: 14.3 g/dL (ref 13.0–17.0)
MCH: 29.1 pg (ref 26.0–34.0)
MCHC: 30.9 g/dL (ref 30.0–36.0)
MCV: 94.1 fL (ref 78.0–100.0)
Platelets: 268 10*3/uL (ref 150–400)
RBC: 4.92 MIL/uL (ref 4.22–5.81)
RDW: 13.9 % (ref 11.5–15.5)
WBC: 4.9 10*3/uL (ref 4.0–10.5)

## 2014-02-25 LAB — LIPID PANEL
CHOL/HDL RATIO: 3 ratio
Cholesterol: 132 mg/dL (ref 0–200)
HDL: 44 mg/dL (ref >39)
LDL Cholesterol: 74 mg/dL (ref 0–99)
Triglycerides: 72 mg/dL (ref <150)
VLDL: 14 mg/dL (ref 0–40)

## 2014-02-25 LAB — HEMOGLOBIN A1C
HEMOGLOBIN A1C: 5.4 % (ref <5.7)
MEAN PLASMA GLUCOSE: 108 mg/dL (ref <117)

## 2014-02-25 MED ORDER — HYDROCHLOROTHIAZIDE 12.5 MG PO CAPS
12.5000 mg | ORAL_CAPSULE | Freq: Every day | ORAL | Status: DC
  Administered 2014-02-25: 12.5 mg via ORAL
  Filled 2014-02-25: qty 1

## 2014-02-25 MED ORDER — PROMETHAZINE HCL 12.5 MG PO TABS: 12.5000 mg | ORAL_TABLET | Freq: Four times a day (QID) | ORAL | Status: DC | PRN

## 2014-02-25 MED ORDER — HYDROCHLOROTHIAZIDE 12.5 MG PO CAPS: 12.5000 mg | ORAL_CAPSULE | Freq: Every day | ORAL | Status: DC

## 2014-02-25 MED ORDER — METOCLOPRAMIDE HCL 10 MG PO TABS: 10.0000 mg | ORAL_TABLET | Freq: Three times a day (TID) | ORAL | Status: DC

## 2014-02-25 NOTE — Progress Notes (Signed)
Utilization review

## 2014-02-25 NOTE — Discharge Summary (Signed)
[facility] Discharge Summary  Patient name: DOHNOVAN CLARIDA Medical record number: [id number] Date of birth: Jul 11, 1976 Age: 38 y.o. Gender: male Date of Admission: 02/24/2014  Date of Discharge: 02/25/2014 Admitting Physician: Willeen Niece, MD  Primary Care Provider: Kennith Maes, DO Transplant Team - [facility] see again in June  Nephrologist - [facility] Consultants: none  Indication for Hospitalization:  Intractable nausea and vomiting  Discharge Diagnoses/Problem List:  Intractable nausea and vomiting History of renal and pancreatic transplantation Hypertension History of diabetes mellitus type I  Disposition: Stable  Discharge Condition:  Improved  Discharge Exam:  General: well appearing man in no distress sitting up in hospital bed HEENT: normocephalic, atraumatic Neck: supple CV: RRR, no murmurs, rubs or gallops, normal S1 and S2 Pulm: CTAB, normal respiratory effort Abdomen: Soft, nontender, nondistended with midline vertical scar, normal bowel sounds Extremities: BKA bilaterally Neuro: CN III-XII intact Psych: appropriate affect, speech and conversant and agrees with plan of care.   Brief Hospital Course:  [name] is a 38 y.o. male who presented with nausea and vomiting. 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  # Intractable nausea and vomiting - 2 day history with no abdominal pain, fevers, or chills. H/o diabetic gastroparesis with multiple admissions but none since his transplants 05/2013. Daily use of marijuana to self-treat pain. KUB negative for acute abd or obstruction. No tenderness nor elevated WBC. Do not believe this is rejection of transplant given no fever, chills, abd pain nor tenderness and normal labs.  In the ED, given reglan 10mg  IV x 1, Zofran 4mg  IVx 1, protonix  40mg  iv x 1, nacl 1l bolus x 2. Admitted to inpatient, clear diet with IV fluids initially and progressed to regular diet. Started IV phenergan with vast improvement in N/V by hospital day 2.  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 Missed one dose of his anti-rejection medications but has been able to take anti-rejection medications since admission (prednisone 5mg  daily, mycophenolate 720mg  PO BID, prograf 2mg  qAM and 1mg  qPM, bactrim 400-80mg  three times weekly). [facility] notified of patient in hospital, not concerned for rejection.  # HTN - BP very elevated in ED to 200s/120s, likely partially elevated due to discomfort. Improved to 135/85 s/p hydralazine 10mg  IV x 1, labetalol 20mg  IV x 1. Continued to have elevated BPs after pain controlled to 150s/90s. Started HCTZ 12.5mg  for outpatient therapy.  # H/o diabetes - No A1c recently, last 12.3 02/2013 (prior to transplant). Lipid profile normal. Recheck A1c - pending   Issues for Follow Up:  None  Significant Procedures:  None  Significant Labs and Imaging:   Recent Labs Lab 02/24/14 1155 02/25/14 0450  WBC 6.9 4.9  HGB 17.0 14.3  HCT 50.8 46.3  PLT 291 268    Recent Labs Lab 02/24/14 1155 02/25/14 0450  NA 140 145  K 3.9 3.7  CL 104 110  CO2 23 24  GLUCOSE 105* 89  BUN 11 11  CREATININE 1.00 1.11  CALCIUM 9.4 8.8  ALKPHOS 184* 151*  AST 12 10  ALT 15 11  ALBUMIN 3.3* 2.8*   Lipase: 66 Serum EtOH: <11 mg/dL  Drugs of Abuse     Component Value Date/Time   LABOPIA NONE DETECTED 02/24/2014 1545   LABOPIA NEG 01/06/2011 2114   COCAINSCRNUR NONE DETECTED 02/24/2014 1545   COCAINSCRNUR NEG  01/21/2011 2024   LABBENZ NONE DETECTED 02/24/2014 1545   LABBENZ NEG 01/21/2011 2024   AMPHETMU NONE DETECTED 02/24/2014 1545   AMPHETMU NEG 01/21/2011 2024   THCU POSITIVE* 02/24/2014 1545   LABBARB NONE DETECTED 02/24/2014 1545    Urinalysis    Component Value Date/Time   COLORURINE YELLOW 02/24/2014 [place] 02/24/2014 1545   LABSPEC 1.020 02/24/2014 1545   PHURINE 6.5 02/24/2014 1545   GLUCOSEU NEGATIVE 02/24/2014 1545   HGBUR NEGATIVE 02/24/2014 1545   HGBUR negative 10/08/2010 [place] 02/24/2014 1545   [place] 02/24/2014 1545   PROTEINUR 30* 02/24/2014 1545   UROBILINOGEN 1.0 02/24/2014 1545   NITRITE NEGATIVE 02/24/2014 1545   LEUKOCYTESUR SMALL* 02/24/2014 1545   KUB:  IMPRESSION: Extensive vascular calcifications. No evidence of bowel obstruction  or free air.  Results/Tests Pending at Time of Discharge:  Hemoglobin A1C  Discharge Medications:    Medication List    ASK your doctor about these medications       aspirin EC 81 MG tablet  Take 81 mg by mouth daily.     MYFORTIC 180 MG EC tablet  Generic drug:  mycophenolate  Take 720 mg by mouth 2 (two) times daily.     omeprazole 20 MG capsule  Commonly known as:  PRILOSEC  Take 20 mg by mouth daily.     predniSONE 5 MG tablet  Commonly known as:  DELTASONE  Take 5 mg by mouth daily.     sulfamethoxazole-trimethoprim 400-80 MG per tablet  Commonly known as:  BACTRIM,SEPTRA  Take 1 tablet by mouth 3 (three) times a week.     tacrolimus 1 MG capsule  Commonly known as:  PROGRAF  Take 1-2 mg by mouth 2 (two) times daily. Take 2 capsules by mouth in the AM and 1 capsule in the evening        Discharge Instructions: Please refer to Patient Instructions section of EMR for full details.  Patient was counseled important signs and symptoms that should prompt return to medical care, changes in medications, dietary instructions, activity restrictions, and follow up appointments.   Follow-Up Appointments:     Follow-up Information   Follow up with Kennith Maes, DO In 1 week. (Hospitalization followup)    Specialty:  Family Medicine   Contact information:   [place] [address] [phone]     This appointment has already been scheduled for you. You will see Dr. Awanda Mink on  03/04/2014 at 2:30pm.  Early Osm[place], Med Student 02/25/2014, 1:01 PM [place]

## 2014-02-25 NOTE — Progress Notes (Signed)
Family Medicine Teaching Service Daily Progress Note Intern Pager: [phone]  Patient name: [name] Medical record number: [id number] Date of birth: Jan 03, 1976 Age: 38 y.o. Gender: male  Primary Care Provider: Kennith Maes, DO Transplant Team - [facility] see again in June  [facility] Consultants: none Code Status: Full  Assessment and Plan: [name] is a 38 y.o. male who presented with nausea and vomiting. 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   # Intractable nausea and vomiting - improving DDx: diabetic gastroparesis,  viral gastroenteritis,  cyclic vomiting, pancreatitis (mild lipase increase, unsure of baseline with transplant) or; pancreatitis and a viral process less likely given no fever, abdominal pain/tenderness, or elevated WBC.  - little concern for rejection of transplant given no fever, chills, abd pain nor tenderness and normal labs.  - tolerating clears, advance diet - Continue reglan and phenergan - UDS + for cannibus, blood alcohol neg.  - Discussed with [name] kidney, will follow up as usual in clinic - report of coffee ground emesis by patient, unconfirmed by nursing and CBC WNL  # S/p renal and pancreatic transplantation 05/2013.  - Able to take anti-rejection medications last night (prednisone 5mg  daily, mycophenolate 720mg  PO BID, prograf 2mg  qAM and 1mg  qPM, bactrim 400-80mg  three times weekly).  - Courtesy call to [facility], not concerned for rejection.  - Reports he saw transplant team 2 weeks ago at [facility] and sees them again in June.   # HTN - BP very elevated in ED to 200s/120s, likely partially elevated due to discomfort. Improved to 135/85 s/p hydralazine 10mg  IV x 1, labetalol 20mg  IV x 1, and zofran for nausea.  - Hydralazine PRN not needed overnight  - 156/90 this  AM, started 12.5 mg HCTZ  # H/o diabetes - s/p transplant - Normal CBGs while admitted  - No A1c recently, last 12.3 02/2013 (prior to transplant)  - Recheck A1c - pending - Lipid profile nl   FEN/GI: NS at 125cc/hr, clear liquid diet  Prophylaxis: Given coffee ground emesis report, will hold pharm VTE ppx and use SCDs above knees and compression stockings.  Disposition: consider discharge today given ability to tolerate PO liquids  Subjective:  Feeling some better. Reports tolerating PO with only 1 episode of emesis.   Objective: Temp:  [98.2 F (36.8 C)-99.4 F (37.4 C)] 99 F (37.2 C) (03/03 1426) Pulse Rate:  [62-145] 62 (03/03 1426) Resp:  [16-30] 18 (03/03 1426) BP: (138-202)/(85-120) 162/94 mmHg (03/03 1426) SpO2:  [93 %-100 %] 98 % (03/03 1426) Weight:  [151 lb 7.3 oz (68.7 kg)-151 lb 9.6 oz (68.765 kg)] 151 lb 7.3 oz (68.7 kg) (03/03 0600) Physical Exam:  Gen: NAD, alert, cooperative with exam HEENT: NCAT CV: RRR, good S1/S2, no murmur Resp: CTABL, no wheezes, non-labored Abd: SNTND, BS present, no guarding or organomegaly, well healed midline scar.  Ext: BL BKA Neuro: Alert and oriented, No gross deficits  Laboratory:  Recent Labs Lab 02/24/14 1155 02/25/14 0450  WBC 6.9 4.9  HGB 17.0 14.3  HCT 50.8 46.3  PLT 291 268    Recent Labs Lab 02/24/14 1155 02/25/14 0450  NA 140 145  K 3.9 3.7  CL 104 110  CO2 23 24  BUN 11 11  CREATININE 1.00 1.11  CALCIUM 9.4 8.8  PROT 8.2 6.7  BILITOT 0.3 0.4  ALKPHOS 184*  151*  ALT 15 11  AST 12 10  GLUCOSE 105* 89   [name], MD 02/25/2014, 2:48 PM [place] PGY-2 [place] Intern pager: [phone], text pages welcome

## 2014-02-25 NOTE — Discharge Instructions (Signed)
Nausea and Vomiting °Nausea means you feel sick to your stomach. Throwing up (vomiting) is a reflex where stomach contents come out of your mouth. °HOME CARE  °· Take medicine as told by your doctor. °· Do not force yourself to eat. However, you do need to drink fluids. °· If you feel like eating, eat a normal diet as told by your doctor. °· Eat rice, wheat, potatoes, bread, lean meats, yogurt, fruits, and vegetables. °· Avoid high-fat foods. °· Drink enough fluids to keep your pee (urine) clear or pale yellow. °· Ask your doctor how to replace body fluid losses (rehydrate). Signs of body fluid loss (dehydration) include: °· Feeling very thirsty. °· Dry lips and mouth. °· Feeling dizzy. °· Dark pee. °· Peeing less than normal. °· Feeling confused. °· Fast breathing or heart rate. °GET HELP RIGHT AWAY IF:  °· You have blood in your throw up. °· You have black or bloody poop (stool). °· You have a bad headache or stiff neck. °· You feel confused. °· You have bad belly (abdominal) pain. °· You have chest pain or trouble breathing. °· You do not pee at least once every 8 hours. °· You have cold, clammy skin. °· You keep throwing up after 24 to 48 hours. °· You have a fever. °MAKE SURE YOU:  °· Understand these instructions. °· Will watch your condition. °· Will get help right away if you are not doing well or get worse. °Document Released: 05/30/2008 Document Revised: 03/05/2012 Document Reviewed: 05/13/2011 °ExitCare® Patient Information ©2014 [employer]. ° °

## 2014-03-04 ENCOUNTER — Encounter: Payer: Self-pay | Admitting: Family Medicine

## 2014-03-04 ENCOUNTER — Ambulatory Visit (INDEPENDENT_AMBULATORY_CARE_PROVIDER_SITE_OTHER): Payer: Medicare Other | Admitting: Family Medicine

## 2014-03-04 VITALS — BP 134/90 | HR 72 | Temp 98.4°F | Wt 164.0 lb

## 2014-03-04 DIAGNOSIS — M25519 Pain in unspecified shoulder: Secondary | ICD-10-CM | POA: Insufficient documentation

## 2014-03-04 NOTE — Assessment & Plan Note (Addendum)
Palable nodule at the [facility] joint with decreased shoulder ROM secondary to this.  Denies specific inciting event, and his clavicle is non mobile so concern for posterior dislocation is very low.  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  F/U in 1-2 months, >50% of 25 minute visit was spent discussing options and management with pt.

## 2014-03-04 NOTE — Progress Notes (Signed)
[name] is a 38 y.o. 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  Since d/c, he has been doing well, denies marijuana intake or further N/V or abdominal pain.   L "Shoulder Pain" - Has been ongoing now for about 6 months, denies inciting trauma to the area or previous injury to the area.  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  However, his pain is mainly located at the [facility] joint and thoracic outlet on the L side and his ROM and strength is limited secondary to this.  Denies fever, chills, LAD, numbness or weakness down his L or R arm.  He is having nighttime awakenings, but denies instability in his shoulder.  Has tried PT for the area with minimal relief, however, has not tried anything else.  Has gradually been getting worse, and described as sharp pain with any type of arm movement.    Past Medical History  Diagnosis Date  . Hypertension   . GERD (gastroesophageal reflux disease)   . Gastropathy   . Chronic pain   . Depression   . Edema   . Headache(784.0)   . 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 Dialysis patient 04/18/12    "[facility]; [place], [place], [place]"  . Blood transfusion   . Arthritis     "I think I do; just in my fingers & my hands"  . Type I diabetes mellitus     "juvenile"  . Gastroparesis   . ESRD (end stage renal disease) on dialysis     History  Smoking status  . Never Smoker   Smokeless tobacco  . Never Used    Family History  Problem Relation Age of Onset  . Diabetes Other   . Hypertension Other   . Heart disease Other     Current Outpatient Prescriptions on File Prior to Visit  Medication Sig Dispense Refill  . aspirin EC 81 MG tablet Take 81 mg by mouth  daily.      . hydrochlorothiazide (MICROZIDE) 12.5 MG capsule Take 1 capsule (12.5 mg total) by mouth daily.  30 capsule  0  . metoCLOPramide (REGLAN) 10 MG tablet Take 1 tablet (10 mg total) by mouth 3 (three) times daily before meals.  90 tablet  0  . mycophenolate (MYFORTIC) 180 MG EC tablet Take 720 mg by mouth 2 (two) times daily.       [name] omeprazole (PRILOSEC) 20 MG capsule Take 20 mg by mouth daily.       . predniSONE (DELTASONE) 5 MG tablet Take 5 mg by mouth daily.       . promethazine (PHENERGAN) 12.5 MG tablet Take 1 tablet (12.5 mg total) by mouth every 6 (six) hours as needed for nausea or vomiting.  20 tablet  0  . sulfamethoxazole-trimethoprim (BACTRIM,SEPTRA) 400-80 MG per tablet Take 1 tablet by mouth 3 (three) times a week.      . tacrolimus (PROGRAF) 1 MG capsule Take 1-2 mg by mouth 2 (two) times daily. Take 2 capsules by mouth in the AM and 1 capsule in the evening       No current facility-administered medications on file prior to visit.  ROS: Per HPI.  All other systems reviewed and are negative.   Physical Exam Filed Vitals:   03/04/14 1458  BP: 134/90  Pulse: 72  Temp: 98.4 F (36.9 C)    Physical Examination: General appearance - alert, well appearing, and in no distress Heart - normal rate and regular rhythm, no murmurs noted Shoulder: Inspection reveals no abnormalities, atrophy or asymmetry. Palpation is normal with no tenderness over AC joint or bicipital groove.  TTP at Left [place] joint with nodular component  AROM limited to 90 FF, AB 90, Ext 30 with IR/ER of 45 degrees on the L.  Nml ROM on R Rotator cuff strength normal throughout. No signs of impingement with negative Neer and Hawkin's tests, empty can. Speeds and Yergason's tests normal. No labral pathology noted with negative Obrien's, negative clunk and good stability. Normal scapular function observed. No apprehension sign.  + crossover maneuver L side   Neck - No obvious deformities.   Limited SB/Rotation B/L with tight thoracic outlet on the L side.  Neck Strength and palpation normal.  Negative Sp[place], Adson testing.

## 2014-03-04 NOTE — Patient Instructions (Signed)
[name], it was nice seeing you today.  We have sent you to Sports medicine clinic located at [address] with Dr. Micheline Chapman at 10 AM on Thursday March 19th.  We will see you back shortly after this.    Thanks, Dr. Awanda Mink

## 2014-03-13 ENCOUNTER — Ambulatory Visit (INDEPENDENT_AMBULATORY_CARE_PROVIDER_SITE_OTHER): Payer: Medicare Other | Admitting: Sports Medicine

## 2014-03-13 ENCOUNTER — Encounter: Payer: Self-pay | Admitting: Sports Medicine

## 2014-03-13 VITALS — BP 143/96 | Ht 73.0 in | Wt 164.0 lb

## 2014-03-13 DIAGNOSIS — M25519 Pain in unspecified shoulder: Secondary | ICD-10-CM

## 2014-03-13 NOTE — Progress Notes (Addendum)
   Subjective:    Patient ID: [name], male    DOB: Nov 08, 1976, 38 y.o.   MRN: [id number]  HPI [name] is s/p kidney and pancreas transplant and rehab for Left Frozen shoulder who presents with Left [place] Joint pain since December 2014. The pain is in his clavicle and neck on the left side and started after he was discharged from rehab for frozen shoulder. He first noticed the pain and then about 10 weeks ago he noticed swelling on the left [place] joint. He says pain is worse with everything--holding even light weight in his left hand, leaning back to relax on a couch. Pain keeps him up at night and he is only able to sleep with his left shoulder off the bed. The most comfortable position is sitting upright.  He cannot take oral NSAIDs due to the kidney transplant and does not want to take stronger pain medicine. He has tried bengay, ice, and heat without relief. He says when he puts his left arm around his back it gets stuck and he needs to use his right to move it.   He does have night sweats and is changing his sheets at night but also notes that he often forgets to turn the heat down when he goes to bed. He denies cough, fever, chills, fatigue.    Review of Systems Negative apart from HPI     Objective:   Physical Exam  Shoulder Inspection: Left [place] joint is prominent with surrounding soft tissue swelling. No erythema. Palpation: Tenderness along the left [place] joint and proximal clavicle. ROM: Range of motion is decreased in all planes due to pain. Passive external rotation of the left shoulder is about 70 compared to 85-90 on the right.  [place] Joint on Left: Focused ultrasound of the left [place] joint shows what appears to be a joint effusion with surrounding soft tissue edema     Assessment & Plan:  38 yo s/p kidney and pancreas transplant who presents with left [place] joint swelling and pain.   CT scan of the neck without contrast to better visualize and evaluate the left [place] joint.  He has good passive external rotation of the left shoulder so I think his previous adhesive capsulitis has resolved. Patient is not able to take NSAIDs do to his renal transplant. Patient will followup with me in the days following the CT scan. We may consider a CT-guided cortisone injection depending on the results.  Seen with [name], MS4

## 2014-03-17 NOTE — Addendum Note (Signed)
Addended by: Cyd Silence on: 03/17/2014 09:12 AM   Modules accepted: Orders

## 2014-03-18 ENCOUNTER — Ambulatory Visit
Admission: RE | Admit: 2014-03-18 | Discharge: 2014-03-18 | Disposition: A | Discharge status: Home / Self Care | Payer: Medicare Other | Source: Ambulatory Visit | Attending: Sports Medicine | Admitting: Sports Medicine

## 2014-03-18 DIAGNOSIS — M25519 Pain in unspecified shoulder: Secondary | ICD-10-CM

## 2014-03-21 ENCOUNTER — Ambulatory Visit (INDEPENDENT_AMBULATORY_CARE_PROVIDER_SITE_OTHER): Payer: Medicare Other | Admitting: Family Medicine

## 2014-03-21 ENCOUNTER — Encounter: Payer: Self-pay | Admitting: Family Medicine

## 2014-03-21 DIAGNOSIS — L7211 Pilar cyst: Secondary | ICD-10-CM

## 2014-03-21 NOTE — Progress Notes (Signed)
[name] is a 38 y.o. male who presents today for pilar cyst removal and L shoulder pain ([place] jt pain).  L [place] jt pain - Recent CT scan done, having trouble sleeping, going back to [facility] for f/u in a couple days.  Pilar Cyst - No changes, no pain, but irritation when has hair cut.   Past Medical History  Diagnosis Date  . Hypertension   . GERD (gastroesophageal reflux disease)   . Gastropathy   . Chronic pain   . Depression   . Edema   . Headache(784.0)   . 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 Dialysis patient 04/18/12    "[name]; [place], [place], [place]"  . Blood transfusion   . Arthritis     "I think I do; just in my fingers & my hands"  . Type I diabetes mellitus     "juvenile"  . Gastroparesis   . ESRD (end stage renal disease) on dialysis     History  Smoking status  . Never Smoker   Smokeless tobacco  . Never Used    Family History  Problem Relation Age of Onset  . Diabetes Other   . Hypertension Other   . Heart disease Other     Current Outpatient Prescriptions on File Prior to Visit  Medication Sig Dispense Refill  . aspirin EC 325 MG tablet Take 325 mg by mouth.      [name] aspirin EC 81 MG tablet Take 81 mg by mouth daily.      . hydrochlorothiazide (MICROZIDE) 12.5 MG capsule Take 1 capsule (12.5 mg total) by mouth daily.  30 capsule  0  . labetalol (NORMODYNE) 200 MG tablet Take 100 mg by mouth.      . metoCLOPramide (REGLAN) 10 MG tablet Take 1 tablet (10 mg total) by mouth 3 (three) times daily before meals.  90 tablet  0  . mycophenolate (MYFORTIC) 180 MG EC tablet Take 720 mg by mouth 2 (two) times daily.       . Mycophenolate Sodium (MYCOPHENOLIC ACID) 99991111 MG TBEC Take 720 mg by mouth.      [name] omeprazole (PRILOSEC) 20 MG capsule Take 20 mg by mouth daily.       [name] omeprazole (PRILOSEC) 20 MG capsule Take 20 mg by mouth.      . predniSONE (DELTASONE) 5 MG tablet Take 5 mg by mouth daily.       . predniSONE (DELTASONE) 5 MG tablet  Take 5 mg by mouth.      . promethazine (PHENERGAN) 12.5 MG tablet Take 1 tablet (12.5 mg total) by mouth every 6 (six) hours as needed for nausea or vomiting.  20 tablet  0  . sulfamethoxazole-trimethoprim (BACTRIM,SEPTRA) 400-80 MG per tablet Take 1 tablet by mouth 3 (three) times a week.      . sulfamethoxazole-trimethoprim (BACTRIM,SEPTRA) 400-80 MG per tablet Take 1 tablet by mouth.      . tacrolimus (PROGRAF) 1 MG capsule Take 1-2 mg by mouth 2 (two) times daily. Take 2 capsules by mouth in the AM and 1 capsule in the evening       No current facility-administered medications on file prior to visit.    ROS: Per HPI.  All other systems reviewed and are negative.   Physical Exam Filed Vitals:   03/21/14 0838  BP: 165/82  Pulse: 92  Temp: 98.1 F (36.7 C)    Physical Examination: General appearance - alert, well appearing, and in  no distress Head: 2 x 2 cm mobile cyst occipital region     Chemistry      Component Value Date/Time   NA 145 02/25/2014 0450   K 3.7 02/25/2014 0450   CL 110 02/25/2014 0450   CO2 24 02/25/2014 0450   BUN 11 02/25/2014 0450   CREATININE 1.11 02/25/2014 0450      Component Value Date/Time   CALCIUM 8.8 02/25/2014 0450   CALCIUM 8.4 09/08/2011 0746   ALKPHOS 151* 02/25/2014 0450   AST 10 02/25/2014 0450   ALT 11 02/25/2014 0450   BILITOT 0.4 02/25/2014 0450

## 2014-03-21 NOTE — Patient Instructions (Signed)

## 2014-03-21 NOTE — Assessment & Plan Note (Signed)
Sebaceous Cyst Excision Procedure Note  Pre-operative Diagnosis: Pilar cyst  Post-operative Diagnosis: normal  Locations:Occipital region   Indications: Irritation  Anesthesia: Lidocaine 2% without epinephrine without added sodium bicarbonate  Procedure Details  History of allergy to iodine: no  Patient informed of the risks (including bleeding and infection) and benefits of the  procedure and Verbal informed consent obtained.  The lesion and surrounding area was given a sterile prep using alcohol and draped in the usual sterile fashion. An incision was made over the cyst, which was dissected free of the surrounding tissue and removed.  The cyst was filled with typical sebaceous material.  The wound was closed with dermabond. Antibiotic ointment and a sterile dressing applied.  The specimen was not sent for pathologic examination. The patient tolerated the procedure well.  EBL: 2 ml  Findings: Pilar cyst  Condition: Stable  Complications: none.  Plan: 1. Instructed to keep the wound dry and covered for 24-48h and clean thereafter. 2. Warning signs of infection were reviewed.   3. Recommended that the patient use NSAID as needed for pain.  4. Return in 2-3 weeks for f/u

## 2014-04-02 ENCOUNTER — Encounter: Payer: Self-pay | Admitting: Sports Medicine

## 2014-04-02 ENCOUNTER — Ambulatory Visit (INDEPENDENT_AMBULATORY_CARE_PROVIDER_SITE_OTHER): Payer: Medicare Other | Admitting: Sports Medicine

## 2014-04-02 VITALS — BP 145/91 | HR 87 | Ht 73.0 in | Wt 160.0 lb

## 2014-04-02 DIAGNOSIS — M25519 Pain in unspecified shoulder: Secondary | ICD-10-CM

## 2014-04-02 NOTE — Progress Notes (Signed)
Patient ID: [name], male   DOB: Apr 22, 1976, 38 y.o.   MRN: [id number]  Patient comes in today to discuss CT findings of his chest. CT shows asymmetric left-sided sternoclavicular joint degenerative changes. Currently, the patient's symptoms are tolerable. His swelling is intermittent in today not terribly painful. I discussed the possibility of an attempted aspiration and injection of this joint but patient wants to hold on that for now. If that is to be ordered in the future I would prefer that interventional radiology perform the procedure. Patient will followup with me when necessary.

## 2014-04-03 ENCOUNTER — Telehealth: Payer: Self-pay | Admitting: Family Medicine

## 2014-04-03 NOTE — Telephone Encounter (Signed)
Forms were placed in Dr Awanda Mink box for completion.[place]

## 2014-04-03 NOTE — Telephone Encounter (Signed)
Pt's wife dropped off paperwork to be filled out concerning FMLA.

## 2014-04-04 NOTE — Telephone Encounter (Signed)
Spoke with patient's wife and informed her that I am leaving form up front for pick up.

## 2014-04-16 ENCOUNTER — Ambulatory Visit: Payer: Medicare Other | Admitting: Family Medicine

## 2014-04-23 ENCOUNTER — Emergency Department (HOSPITAL_COMMUNITY): Payer: Medicare Other

## 2014-04-23 ENCOUNTER — Emergency Department (HOSPITAL_COMMUNITY)
Admission: EM | Admit: 2014-04-23 | Discharge: 2014-04-23 | Disposition: A | Discharge status: Home / Self Care | Payer: Medicare Other | Attending: Emergency Medicine | Admitting: Emergency Medicine

## 2014-04-23 ENCOUNTER — Encounter (HOSPITAL_COMMUNITY): Payer: Self-pay | Admitting: Emergency Medicine

## 2014-04-23 DIAGNOSIS — Z940 Kidney transplant status: Secondary | ICD-10-CM | POA: Insufficient documentation

## 2014-04-23 DIAGNOSIS — R109 Unspecified abdominal pain: Secondary | ICD-10-CM | POA: Insufficient documentation

## 2014-04-23 DIAGNOSIS — Z992 Dependence on renal dialysis: Secondary | ICD-10-CM | POA: Insufficient documentation

## 2014-04-23 DIAGNOSIS — IMO0002 Reserved for concepts with insufficient information to code with codable children: Secondary | ICD-10-CM | POA: Insufficient documentation

## 2014-04-23 DIAGNOSIS — K219 Gastro-esophageal reflux disease without esophagitis: Secondary | ICD-10-CM | POA: Insufficient documentation

## 2014-04-23 DIAGNOSIS — E109 Type 1 diabetes mellitus without complications: Secondary | ICD-10-CM | POA: Insufficient documentation

## 2014-04-23 DIAGNOSIS — G8929 Other chronic pain: Secondary | ICD-10-CM | POA: Insufficient documentation

## 2014-04-23 DIAGNOSIS — M129 Arthropathy, unspecified: Secondary | ICD-10-CM | POA: Insufficient documentation

## 2014-04-23 DIAGNOSIS — R197 Diarrhea, unspecified: Secondary | ICD-10-CM | POA: Insufficient documentation

## 2014-04-23 DIAGNOSIS — I120 Hypertensive chronic kidney disease with stage 5 chronic kidney disease or end stage renal disease: Secondary | ICD-10-CM | POA: Insufficient documentation

## 2014-04-23 DIAGNOSIS — N186 End stage renal disease: Secondary | ICD-10-CM | POA: Insufficient documentation

## 2014-04-23 DIAGNOSIS — S88119A Complete traumatic amputation at level between knee and ankle, unspecified lower leg, initial encounter: Secondary | ICD-10-CM | POA: Insufficient documentation

## 2014-04-23 DIAGNOSIS — Z7982 Long term (current) use of aspirin: Secondary | ICD-10-CM | POA: Insufficient documentation

## 2014-04-23 DIAGNOSIS — R5383 Other fatigue: Secondary | ICD-10-CM

## 2014-04-23 DIAGNOSIS — R112 Nausea with vomiting, unspecified: Secondary | ICD-10-CM

## 2014-04-23 DIAGNOSIS — Z79899 Other long term (current) drug therapy: Secondary | ICD-10-CM | POA: Insufficient documentation

## 2014-04-23 DIAGNOSIS — Z9483 Pancreas transplant status: Secondary | ICD-10-CM | POA: Insufficient documentation

## 2014-04-23 DIAGNOSIS — Z8659 Personal history of other mental and behavioral disorders: Secondary | ICD-10-CM | POA: Insufficient documentation

## 2014-04-23 DIAGNOSIS — R5381 Other malaise: Secondary | ICD-10-CM | POA: Insufficient documentation

## 2014-04-23 LAB — CBC WITH DIFFERENTIAL/PLATELET
BASOS PCT: 1 % (ref 0–1)
Basophils Absolute: 0 10*3/uL (ref 0.0–0.1)
EOS ABS: 0.1 10*3/uL (ref 0.0–0.7)
Eosinophils Relative: 1 % (ref 0–5)
HCT: 58.6 % — ABNORMAL HIGH (ref 39.0–52.0)
Hemoglobin: 19.5 g/dL — ABNORMAL HIGH (ref 13.0–17.0)
LYMPHS ABS: 0.9 10*3/uL (ref 0.7–4.0)
Lymphocytes Relative: 20 % (ref 12–46)
MCH: 31.5 pg (ref 26.0–34.0)
MCHC: 33.3 g/dL (ref 30.0–36.0)
MCV: 94.7 fL (ref 78.0–100.0)
Monocytes Absolute: 0.4 10*3/uL (ref 0.1–1.0)
Monocytes Relative: 10 % (ref 3–12)
NEUTROS PCT: 69 % (ref 43–77)
Neutro Abs: 3 10*3/uL (ref 1.7–7.7)
Platelets: 202 10*3/uL (ref 150–400)
RBC: 6.19 MIL/uL — AB (ref 4.22–5.81)
RDW: 14.8 % (ref 11.5–15.5)
WBC: 4.3 10*3/uL (ref 4.0–10.5)

## 2014-04-23 LAB — COMPREHENSIVE METABOLIC PANEL
ALBUMIN: 4 g/dL (ref 3.5–5.2)
ALK PHOS: 196 U/L — AB (ref 39–117)
ALT: 16 U/L (ref 0–53)
AST: 21 U/L (ref 0–37)
BUN: 13 mg/dL (ref 6–23)
CO2: 23 mEq/L (ref 19–32)
Calcium: 9.4 mg/dL (ref 8.4–10.5)
Chloride: 98 mEq/L (ref 96–112)
Creatinine, Ser: 1.22 mg/dL (ref 0.50–1.35)
GFR calc Af Amer: 85 mL/min — ABNORMAL LOW (ref >90)
GFR calc non Af Amer: 73 mL/min — ABNORMAL LOW (ref >90)
Glucose, Bld: 94 mg/dL (ref 70–99)
Potassium: 5.1 mEq/L (ref 3.7–5.3)
SODIUM: 135 meq/L — AB (ref 137–147)
TOTAL PROTEIN: 8.4 g/dL — AB (ref 6.0–8.3)
Total Bilirubin: 0.4 mg/dL (ref 0.3–1.2)

## 2014-04-23 LAB — LIPASE, BLOOD: Lipase: 66 U/L — ABNORMAL HIGH (ref 11–59)

## 2014-04-23 MED ORDER — ONDANSETRON 4 MG PO TBDP
8.0000 mg | ORAL_TABLET | Freq: Once | ORAL | Status: AC
  Administered 2014-04-23: 8 mg via ORAL
  Filled 2014-04-23: qty 2

## 2014-04-23 MED ORDER — PANTOPRAZOLE SODIUM 40 MG IV SOLR
40.0000 mg | Freq: Once | INTRAVENOUS | Status: AC
  Administered 2014-04-23: 40 mg via INTRAVENOUS
  Filled 2014-04-23: qty 40

## 2014-04-23 MED ORDER — SODIUM CHLORIDE 0.9 % IV BOLUS (SEPSIS)
1000.0000 mL | Freq: Once | INTRAVENOUS | Status: AC
  Administered 2014-04-23: 1000 mL via INTRAVENOUS

## 2014-04-23 MED ORDER — PROMETHAZINE HCL 25 MG RE SUPP: 25.0000 mg | Freq: Four times a day (QID) | RECTAL | Status: DC | PRN

## 2014-04-23 MED ORDER — PROMETHAZINE HCL 25 MG/ML IJ SOLN
12.5000 mg | Freq: Once | INTRAMUSCULAR | Status: AC
  Administered 2014-04-23: 12.5 mg via INTRAVENOUS
  Filled 2014-04-23: qty 1

## 2014-04-23 MED ORDER — FAMOTIDINE IN NACL 20-0.9 MG/50ML-% IV SOLN
20.0000 mg | Freq: Once | INTRAVENOUS | Status: AC
  Administered 2014-04-23: 20 mg via INTRAVENOUS
  Filled 2014-04-23: qty 50

## 2014-04-23 MED ORDER — METOCLOPRAMIDE HCL 5 MG/ML IJ SOLN
10.0000 mg | Freq: Once | INTRAMUSCULAR | Status: AC
  Administered 2014-04-23: 10 mg via INTRAVENOUS
  Filled 2014-04-23: qty 2

## 2014-04-23 MED ORDER — HYDROMORPHONE HCL PF 1 MG/ML IJ SOLN
1.0000 mg | Freq: Once | INTRAMUSCULAR | Status: AC
  Administered 2014-04-23: 1 mg via INTRAVENOUS
  Filled 2014-04-23: qty 1

## 2014-04-23 MED ORDER — OXYCODONE-ACETAMINOPHEN 5-325 MG PO TABS: 1.0000 | ORAL_TABLET | ORAL | Status: DC | PRN

## 2014-04-23 MED ORDER — LABETALOL HCL 5 MG/ML IV SOLN: 5.0000 mg | Freq: Once | INTRAVENOUS | Status: DC

## 2014-04-23 MED ORDER — LABETALOL HCL 5 MG/ML IV SOLN
10.0000 mg | Freq: Once | INTRAVENOUS | Status: AC
  Administered 2014-04-23: 10 mg via INTRAVENOUS
  Filled 2014-04-23: qty 4

## 2014-04-23 MED ORDER — HYDROMORPHONE HCL PF 1 MG/ML IJ SOLN
0.5000 mg | Freq: Once | INTRAMUSCULAR | Status: AC
  Administered 2014-04-23: 0.5 mg via INTRAVENOUS
  Filled 2014-04-23: qty 1

## 2014-04-23 NOTE — ED Notes (Signed)
PA at bedside.

## 2014-04-23 NOTE — ED Notes (Signed)
Pt reports nausea, vomiting since last night also generalized abdominal pain. 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 Pt is a x 4.

## 2014-04-23 NOTE — ED Provider Notes (Signed)
Medical screening examination/treatment/procedure(s) were performed by non-physician practitioner and as supervising physician I was immediately available for consultation/collaboration.   EKG Interpretation None       Threasa Beards, MD 04/23/14 2133

## 2014-04-23 NOTE — ED Notes (Signed)
Pt asked and attempted a urine sample with no results. Will follow up shortly.

## 2014-04-23 NOTE — Discharge Instructions (Signed)
Continue all regular medications. Take percocet as prescribed for pain. Take phenergan suppositories for nausea and vomiting. Drink plenty of fluids. Follow up with your primary care doctor.    Gastroparesis  Gastroparesis is also called slowed stomach emptying (delayed gastric emptying). It is a condition in which the stomach takes too long to empty its contents. It often happens in people with diabetes.  CAUSES  Gastroparesis happens when nerves to the stomach are damaged or stop working. When the nerves are damaged, the muscles of the stomach and intestines do not work normally. The movement of food is slowed or stopped. High blood glucose (sugar) causes changes in nerves and can damage the blood vessels that carry oxygen and nutrients to the nerves. RISK FACTORS  Diabetes.  Post-viral syndromes.  Eating disorders (anorexia, bulimia).  Surgery on the stomach or vagus nerve.  Gastroesophageal reflux disease (rarely).  Smooth muscle disorders (amyloidosis, scleroderma).  Metabolic disorders, including hypothyroidism.  Parkinson disease. SYMPTOMS   Heartburn.  Feeling sick to your stomach (nausea).  Vomiting of undigested food.  An early feeling of fullness when eating.  Weight loss.  Abdominal bloating.  Erratic blood glucose levels.  Lack of appetite.  Gastroesophageal reflux.  Spasms of the stomach wall. Complications can include:  Bacterial overgrowth in stomach. Food stays in the stomach and can ferment and cause bacteria to grow.  Weight loss due to difficulty digesting and absorbing nutrients.  Vomiting.  Obstruction in the stomach. Undigested food can harden and cause nausea and vomiting.  Blood glucose fluctuations caused by inconsistent food absorption. DIAGNOSIS  The diagnosis of gastroparesis is confirmed through one or more of the following tests:  Barium X-rays and scans. These tests look at how long it takes for food to move through the  stomach.  Gastric manometry. This test measures electrical and muscular activity in the stomach. A thin tube is passed down the throat into the stomach. The tube contains a wire that takes measurements of the stomach's electrical and muscular activity as it digests liquids and solid food.  Endoscopy. This procedure is done with a long, thin tube called an endoscope. It is passed through the mouth and gently down the esophagus into the stomach. This tube helps the caregiver look at the lining of the stomach to check for any abnormalities.  Ultrasonography. This can rule out gallbladder disease or pancreatitis. This test will outline and define the shape of the gallbladder and pancreas. TREATMENT   Treatments may include:  Exercise.  Medicines to control nausea and vomiting.  Medicines to stimulate stomach muscles.  Changes in what and when you eat.  Having smaller meals more often.  Eating low-fiber forms of high-fiber foods, such as eating cooked vegetables instead of raw vegetables.  Eating low-fat foods.  Consuming liquids, which are easier to digest.  In severe cases, feeding tubes and intravenous (IV) feeding may be needed. It is important to note that in most cases, treatment does not cure gastroparesis. It is usually a lasting (chronic) condition. Treatment helps you manage the underlying condition so that you can be as healthy and comfortable as possible. Other treatments  A gastric neurostimulator has been developed to assist people with gastroparesis. The battery-operated device is surgically implanted. It emits mild electrical pulses to help improve stomach emptying and to control nausea and vomiting.  The use of botulinum toxin has been shown to improve stomach emptying by decreasing the prolonged contractions of the muscle between the stomach and the small intestine (pyloric  sphincter). The benefits are temporary. SEEK MEDICAL CARE IF:   You have diabetes and you are  having problems keeping your blood glucose in goal range.  You are having nausea, vomiting, bloating, or early feelings of fullness with eating.  Your symptoms do not change with a change in diet. Document Released: 12/12/2005 Document Revised: 04/08/2013 Document Reviewed: 05/21/2009 [facility] Patient Information 2014 [place], [place].

## 2014-04-23 NOTE — ED Notes (Signed)
Patient reports he has been sipping ginger ale with no vomiting.

## 2014-04-23 NOTE — ED Notes (Signed)
Pt has had several episodes of coffee ground emesis.

## 2014-04-23 NOTE — ED Notes (Signed)
Unable to obtain blood at this time, LAB called

## 2014-04-23 NOTE — ED Provider Notes (Signed)
CSN: [id number]     Arrival date & time 04/23/14  1331 History   First MD Initiated Contact with Patient 04/23/14 1555     Chief Complaint  Patient presents with  . Abdominal Pain  . Emesis     (Consider location/radiation/quality/duration/timing/severity/associated sxs/prior Treatment) HPI [name] is a 38 y.o. male who presents emergency department complaining of nausea, vomiting, abdominal pain. Patient with history of kidney pancreas transplant in June 2014. States his nausea vomiting developed 2 days ago. States unable to keep anything down has not eaten in 2 days. He states he had similar episode a month ago, was admitted, felt better after medications and IV fluids. Patient's concern is that he is unable to keep his medications down including his transplant meds. Patient is taking Reglan at home with no relief. He states his abdominal pain is generalized. Room he denies any fever, chills. He denies any chest pain. He states he did have an episode of diarrhea today. No other complaints.    Past Medical History  Diagnosis Date  . Hypertension   . GERD (gastroesophageal reflux disease)   . Gastropathy   . Chronic pain   . Depression   . Edema   . Headache(784.0)   . 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 Dialysis patient 04/18/12    "[facility]; [place], [place], [place]"  . Blood transfusion   . Arthritis     "I think I do; just in my fingers & my hands"  . Type I diabetes mellitus     "juvenile"  . Gastroparesis   . ESRD (end stage renal disease) on dialysis    Past Surgical History  Procedure Laterality Date  . Below knee leg amputation  "it's been awhile"    bilaterally  . Cataract extraction  ~ 2011    right  . Av fistula placement  08/2011    left upper arm  . Combined kidney-pancreas transplant     Family History  Problem Relation Age of Onset  . Diabetes Other   . Hypertension Other   . Heart disease Other    History  Substance Use  Topics  . Smoking status: Never Smoker   . Smokeless tobacco: Never Used  . Alcohol Use: Yes    Review of Systems  Constitutional: Positive for fatigue. Negative for fever and chills.  Respiratory: Negative for cough, chest tightness and shortness of breath.   Cardiovascular: Negative for chest pain, palpitations and leg swelling.  Gastrointestinal: Positive for nausea, vomiting, abdominal pain and diarrhea. Negative for abdominal distention.  Genitourinary: Negative for dysuria, urgency, frequency and hematuria.  Musculoskeletal: Negative for arthralgias, myalgias, neck pain and neck stiffness.  Skin: Negative for rash.  Allergic/Immunologic: Negative for immunocompromised state.  Neurological: Positive for weakness. Negative for dizziness, light-headedness, numbness and headaches.      Allergies  Review of patient's allergies indicates no known allergies.  Home Medications   Prior to Admission medications   Medication Sig Start Date End Date Taking? Authorizing Provider  aspirin EC 325 MG tablet Take 325 mg by mouth. 06/20/13   Historical Provider, MD  aspirin EC 81 MG tablet Take 81 mg by mouth daily.    Historical Provider, MD  hydrochlorothiazide (MICROZIDE) 12.5 MG capsule Take 1 capsule (12.5 mg total) by mouth daily. 02/25/14   [name], MD  labetalol (NORMODYNE) 200 MG tablet Take 100 mg by mouth. 10/25/13   Historical Provider, MD  metoCLOPramide (REGLAN) 10 MG tablet  Take 1 tablet (10 mg total) by mouth 3 (three) times daily before meals. 02/25/14   [name], MD  mycophenolate (MYFORTIC) 180 MG EC tablet Take 720 mg by mouth 2 (two) times daily.  07/06/13   Historical Provider, MD  Mycophenolate Sodium (MYCOPHENOLIC ACID) 99991111 MG TBEC Take 720 mg by mouth. 11/07/13   Historical Provider, MD  omeprazole (PRILOSEC) 20 MG capsule Take 20 mg by mouth daily.     Historical Provider, MD  omeprazole (PRILOSEC) 20 MG capsule Take 20 mg by mouth. 09/02/13   Historical  Provider, MD  predniSONE (DELTASONE) 5 MG tablet Take 5 mg by mouth daily.  06/20/13   Historical Provider, MD  predniSONE (DELTASONE) 5 MG tablet Take 5 mg by mouth. 08/13/13   Historical Provider, MD  promethazine (PHENERGAN) 12.5 MG tablet Take 1 tablet (12.5 mg total) by mouth every 6 (six) hours as needed for nausea or vomiting. 02/25/14   [name], MD  sulfamethoxazole-trimethoprim (BACTRIM,SEPTRA) 400-80 MG per tablet Take 1 tablet by mouth 3 (three) times a week.    Historical Provider, MD  sulfamethoxazole-trimethoprim (BACTRIM,SEPTRA) 400-80 MG per tablet Take 1 tablet by mouth. 12/11/13   Historical Provider, MD  tacrolimus (PROGRAF) 1 MG capsule Take 1-2 mg by mouth 2 (two) times daily. Take 2 capsules by mouth in the AM and 1 capsule in the evening 07/16/13   Historical Provider, MD   BP 172/110  Pulse 105  Temp(Src) 97.8 F (36.6 C) (Oral)  Resp 18  Wt 160 lb (72.576 kg)  SpO2 100% Physical Exam  Nursing note and vitals reviewed. Constitutional: He is oriented to person, place, and time. He appears well-developed and well-nourished. No distress.  HENT:  Head: Normocephalic and atraumatic.  Eyes: Conjunctivae are normal.  Neck: Neck supple.  Cardiovascular: Normal rate, regular rhythm and normal heart sounds.   Pulmonary/Chest: Effort normal. No respiratory distress. He has no wheezes. He has no rales.  Abdominal: Soft. Bowel sounds are normal. He exhibits no distension. There is tenderness. There is no rebound and no guarding.  Diffuse tenderness  Musculoskeletal:  Bilateral amputee  Neurological: He is alert and oriented to person, place, and time.  Skin: Skin is warm and dry.    ED Course  Procedures (including critical care time) Labs Review Labs Reviewed  CBC WITH DIFFERENTIAL - Abnormal; Notable for the following:    RBC 6.19 (*)    Hemoglobin 19.5 (*)    HCT 58.6 (*)    All other components within normal limits  COMPREHENSIVE METABOLIC PANEL -  Abnormal; Notable for the following:    Sodium 135 (*)    Total Protein 8.4 (*)    Alkaline Phosphatase 196 (*)    GFR calc non Af Amer 73 (*)    GFR calc Af Amer 85 (*)    All other components within normal limits  LIPASE, BLOOD - Abnormal; Notable for the following:    Lipase 66 (*)    All other components within normal limits  URINALYSIS, ROUTINE W REFLEX MICROSCOPIC    Imaging Review Dg Abd Acute W/chest  04/23/2014   CLINICAL DATA:  Nausea and vomiting and diarrhea with generalized abdominal pain.  EXAM: ACUTE ABDOMEN SERIES (ABDOMEN 2 VIEW & CHEST 1 VIEW)  COMPARISON:  CT CHEST LIMITED W/O CM dated 03/18/2014; DG ABD 1 VIEW dated 02/24/2014; CT ABD/PELV WO CM dated 08/11/2012  FINDINGS: The lungs are well-expanded and clear. The cardiopericardial silhouette and pulmonary vascularity are within the  limits of normal. There is no pleural effusion. The bony thorax exhibits no acute abnormality. The intra-abdominal bowel gas pattern is nonspecific. There small amounts of gas within minimally distended small bowel loops in the left mid abdomen. No free extraluminal gas collections are demonstrated. There is stool and gas in the region of the rectum. There is arterial calcification within the pelvis which is been previously described.  IMPRESSION: 1. There is no active cardiopulmonary disease. 2. Within the abdomen the bowel gas pattern is nonspecific. There is no evidence of obstruction or perforation. 3. Arterial calcifications within the pelvis are present which is unusual for patient of this age.   Electronically Signed   By: David  [name]   On: 04/23/2014 18:45     EKG Interpretation None      MDM   Final diagnoses:  Nausea & vomiting    4:43 PM Route emergency department patient developed some bright red blood in his emesis. Also states his emesis as black. This was witnessed by a nurse. I will start on protonic subjective. Patient received his first dose of pain medicine nausea  medicine. Will start fluids. Will monitor. He is hypertensive, normal HR.   Labs unremarkable other than elevated hgb suggesting possible dehydration.  Pt receiving fluids, nausea and pain medications.  9:28 PM Pt feeling much better. He has been tolerating PO fluids in ED. I offered him admission given I am concerned for him being able to tolerate his anti rejection medications, however he did not want to be admitted and chose to go home. i have prescribed him 20 tabs of percocet for pain and 12 phenergan supp. Instructed to return if worsening symptoms.    Filed Vitals:   04/23/14 1945 04/23/14 2015 04/23/14 2030 04/23/14 2100  BP: 156/90 168/88 121/60 148/83  Pulse: 96 96 92 93  Temp:      TempSrc:      Resp:      Weight:      SpO2: 99% 97% 96% 97%     Renold Genta, PA-C 04/23/14 2133

## 2014-05-30 ENCOUNTER — Ambulatory Visit (INDEPENDENT_AMBULATORY_CARE_PROVIDER_SITE_OTHER): Payer: Medicare Other | Admitting: Sports Medicine

## 2014-05-30 ENCOUNTER — Encounter: Payer: Self-pay | Admitting: Sports Medicine

## 2014-05-30 VITALS — BP 158/87 | HR 85 | Temp 98.0°F | Wt 183.2 lb

## 2014-05-30 DIAGNOSIS — S88119A Complete traumatic amputation at level between knee and ankle, unspecified lower leg, initial encounter: Secondary | ICD-10-CM

## 2014-05-30 DIAGNOSIS — T879 Unspecified complications of amputation stump: Secondary | ICD-10-CM

## 2014-05-30 DIAGNOSIS — Z89512 Acquired absence of left leg below knee: Secondary | ICD-10-CM

## 2014-05-30 DIAGNOSIS — N186 End stage renal disease: Secondary | ICD-10-CM

## 2014-05-30 DIAGNOSIS — Z89511 Acquired absence of right leg below knee: Secondary | ICD-10-CM

## 2014-05-30 LAB — CBC WITH DIFFERENTIAL/PLATELET
BASOS ABS: 0 10*3/uL (ref 0.0–0.1)
Basophils Relative: 1 % (ref 0–1)
EOS PCT: 5 % (ref 0–5)
Eosinophils Absolute: 0.1 10*3/uL (ref 0.0–0.7)
HEMATOCRIT: 51.2 % (ref 39.0–52.0)
HEMOGLOBIN: 17 g/dL (ref 13.0–17.0)
Lymphocytes Relative: 13 % (ref 12–46)
Lymphs Abs: 0.3 10*3/uL — ABNORMAL LOW (ref 0.7–4.0)
MCH: 30.5 pg (ref 26.0–34.0)
MCHC: 33.2 g/dL (ref 30.0–36.0)
MCV: 91.8 fL (ref 78.0–100.0)
MONOS PCT: 23 % — AB (ref 3–12)
Monocytes Absolute: 0.6 10*3/uL (ref 0.1–1.0)
Neutro Abs: 1.5 10*3/uL — ABNORMAL LOW (ref 1.7–7.7)
Neutrophils Relative %: 58 % (ref 43–77)
Platelets: 206 10*3/uL (ref 150–400)
RBC: 5.58 MIL/uL (ref 4.22–5.81)
RDW: 15.5 % (ref 11.5–15.5)
WBC: 2.5 10*3/uL — ABNORMAL LOW (ref 4.0–10.5)

## 2014-05-30 MED ORDER — HYDROCODONE-ACETAMINOPHEN 10-325 MG PO TABS: 1.0000 | ORAL_TABLET | Freq: Three times a day (TID) | ORAL | Status: DC | PRN

## 2014-05-30 MED ORDER — DOXYCYCLINE HYCLATE 100 MG PO TABS: 100.0000 mg | ORAL_TABLET | Freq: Two times a day (BID) | ORAL | Status: DC

## 2014-05-30 NOTE — Assessment & Plan Note (Addendum)
Acute condition  - this appears superficially infected with a questionable prepatellar cellulitis.  Bedside ultrasound did not show any fluid collections.  Given the fact he is immunosuppressed and having questionable subjective fevers and he believes a course of antibiotics is warranted. Doxycycline x10 days Pain medicine. Patient instructed to abstain from using his left BKA prosthesis for the next 5-7 days.  Patient reports he is working on trying to a wheelchair and declines prescription for Dealer.   He is to followup in one week to review lab work with his PCP.  Encouraged followup with nephrology as well.

## 2014-05-30 NOTE — Progress Notes (Signed)
  [name] - 37 y.o. male MRN [id number]  Date of birth: 1976-07-29  SUBJECTIVE:  Including CC & ROS.  Chief Complaint  Patient presents with  . Knee Injury    left knee, from nail   Here today for acute visit.  Reports the injury occurred approximately 2 weeks ago.  He was crawling on the ground and struck a screw that he did not see.  He has had draining and erythema from his wound since that time.  He is additionally having other pressure ulcers show up from where his BKA contact the lateral aspect and medial aspect of his leg.  His wheelchair is essentially nonfunctioning outside of his home but he is able to get around inside without difficulty.  He does not use crutches or other assistive device.  Pt denies any objective fevers, chills, or rigors.  No new nausea or vomiting.   HISTORY: Past Medical, Surgical, Social, and Family History Reviewed & Updated per EMR. 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 In no discomfort; no respiratory distress  PSYCH:  alert and appropriate, good insight  Left Knee Exam: Gen:  Bilateral BKA.  Left prepatellar region with nondraining ulceration and surrounding edematous/boggy tissue without overt fluctuance.  Small Fibular head ulceration, medial tibial plateau ulceration.  Surgical wound well healed Palp:  Nontender other than over ulcerations. ROM:  Full flexion extension NV:  Sensation intact, good capillary refill Tests:  Limited bedside ultrasound: Ultrasound of the below pictured  lesions without evidence of hypoechoic fluid pockets.  There is generalized bogginess to the tissue but no evidence of abscess.     ASSESSMENT & PLAN: See problem based charting & AVS for pt instructions.

## 2014-05-30 NOTE — Patient Instructions (Signed)
Please followup with Dr. Awanda Mink next week to review your results and check on her progress. You need to stay off of this leg for the next 5-7 days. Is not improved on followup we will consider having you see Dr. Sharol Given or the wound clinic.

## 2014-05-31 LAB — RENAL FUNCTION PANEL
Albumin: 3.7 g/dL (ref 3.5–5.2)
BUN: 11 mg/dL (ref 6–23)
CHLORIDE: 103 meq/L (ref 96–112)
CO2: 25 meq/L (ref 19–32)
CREATININE: 1.2 mg/dL (ref 0.50–1.35)
Calcium: 9.2 mg/dL (ref 8.4–10.5)
Glucose, Bld: 102 mg/dL — ABNORMAL HIGH (ref 70–99)
POTASSIUM: 4.6 meq/L (ref 3.5–5.3)
Phosphorus: 2.9 mg/dL (ref 2.3–4.6)
Sodium: 137 mEq/L (ref 135–145)

## 2014-06-05 ENCOUNTER — Emergency Department (HOSPITAL_COMMUNITY)
Admission: EM | Admit: 2014-06-05 | Discharge: 2014-06-06 | Disposition: A | Discharge status: Home / Self Care | Payer: Medicare Other | Attending: Emergency Medicine | Admitting: Emergency Medicine

## 2014-06-05 ENCOUNTER — Encounter (HOSPITAL_COMMUNITY): Payer: Self-pay | Admitting: Emergency Medicine

## 2014-06-05 DIAGNOSIS — Z8659 Personal history of other mental and behavioral disorders: Secondary | ICD-10-CM | POA: Insufficient documentation

## 2014-06-05 DIAGNOSIS — E109 Type 1 diabetes mellitus without complications: Secondary | ICD-10-CM | POA: Insufficient documentation

## 2014-06-05 DIAGNOSIS — Z7982 Long term (current) use of aspirin: Secondary | ICD-10-CM | POA: Insufficient documentation

## 2014-06-05 DIAGNOSIS — IMO0002 Reserved for concepts with insufficient information to code with codable children: Secondary | ICD-10-CM | POA: Insufficient documentation

## 2014-06-05 DIAGNOSIS — K219 Gastro-esophageal reflux disease without esophagitis: Secondary | ICD-10-CM | POA: Insufficient documentation

## 2014-06-05 DIAGNOSIS — Z992 Dependence on renal dialysis: Secondary | ICD-10-CM | POA: Insufficient documentation

## 2014-06-05 DIAGNOSIS — S88119A Complete traumatic amputation at level between knee and ankle, unspecified lower leg, initial encounter: Secondary | ICD-10-CM | POA: Insufficient documentation

## 2014-06-05 DIAGNOSIS — G8929 Other chronic pain: Secondary | ICD-10-CM | POA: Insufficient documentation

## 2014-06-05 DIAGNOSIS — R112 Nausea with vomiting, unspecified: Secondary | ICD-10-CM | POA: Insufficient documentation

## 2014-06-05 DIAGNOSIS — N186 End stage renal disease: Secondary | ICD-10-CM | POA: Insufficient documentation

## 2014-06-05 DIAGNOSIS — Z792 Long term (current) use of antibiotics: Secondary | ICD-10-CM | POA: Insufficient documentation

## 2014-06-05 DIAGNOSIS — R197 Diarrhea, unspecified: Secondary | ICD-10-CM | POA: Insufficient documentation

## 2014-06-05 DIAGNOSIS — R109 Unspecified abdominal pain: Secondary | ICD-10-CM | POA: Insufficient documentation

## 2014-06-05 DIAGNOSIS — L97909 Non-pressure chronic ulcer of unspecified part of unspecified lower leg with unspecified severity: Secondary | ICD-10-CM | POA: Insufficient documentation

## 2014-06-05 DIAGNOSIS — I120 Hypertensive chronic kidney disease with stage 5 chronic kidney disease or end stage renal disease: Secondary | ICD-10-CM | POA: Insufficient documentation

## 2014-06-05 DIAGNOSIS — M129 Arthropathy, unspecified: Secondary | ICD-10-CM | POA: Insufficient documentation

## 2014-06-05 DIAGNOSIS — E860 Dehydration: Secondary | ICD-10-CM | POA: Insufficient documentation

## 2014-06-05 NOTE — ED Notes (Signed)
Pt states he has had diarrhea for the past couple of days and his stomach has been upset and hurting  Pt states he has been out in the sun and not drinking enough  Pt states his mouth is dry  Pt is a bilateral BKA and states his stumps have been throbbing  Pt has a couple of sores noted on his left stump

## 2014-06-06 ENCOUNTER — Ambulatory Visit: Payer: Medicare Other | Admitting: Family Medicine

## 2014-06-06 LAB — CBC WITH DIFFERENTIAL/PLATELET
Basophils Absolute: 0.1 10*3/uL (ref 0.0–0.1)
Basophils Relative: 3 % — ABNORMAL HIGH (ref 0–1)
EOS ABS: 0.1 10*3/uL (ref 0.0–0.7)
EOS PCT: 3 % (ref 0–5)
HCT: 51.4 % (ref 39.0–52.0)
HEMOGLOBIN: 16.7 g/dL (ref 13.0–17.0)
Lymphocytes Relative: 17 % (ref 12–46)
Lymphs Abs: 0.6 10*3/uL — ABNORMAL LOW (ref 0.7–4.0)
MCH: 30.6 pg (ref 26.0–34.0)
MCHC: 32.5 g/dL (ref 30.0–36.0)
MCV: 94.1 fL (ref 78.0–100.0)
MONO ABS: 0.8 10*3/uL (ref 0.1–1.0)
Monocytes Relative: 22 % — ABNORMAL HIGH (ref 3–12)
NEUTROS ABS: 2.1 10*3/uL (ref 1.7–7.7)
NEUTROS PCT: 55 % (ref 43–77)
Platelets: 189 10*3/uL (ref 150–400)
RBC: 5.46 MIL/uL (ref 4.22–5.81)
RDW: 14.8 % (ref 11.5–15.5)
WBC: 3.7 10*3/uL — ABNORMAL LOW (ref 4.0–10.5)

## 2014-06-06 LAB — URINALYSIS, ROUTINE W REFLEX MICROSCOPIC
Bilirubin Urine: NEGATIVE
Glucose, UA: NEGATIVE mg/dL
HGB URINE DIPSTICK: NEGATIVE
Ketones, ur: NEGATIVE mg/dL
Leukocytes, UA: NEGATIVE
NITRITE: NEGATIVE
Protein, ur: NEGATIVE mg/dL
SPECIFIC GRAVITY, URINE: 1.013 (ref 1.005–1.030)
UROBILINOGEN UA: 0.2 mg/dL (ref 0.0–1.0)
pH: 6 (ref 5.0–8.0)

## 2014-06-06 LAB — COMPREHENSIVE METABOLIC PANEL
ALT: 10 U/L (ref 0–53)
AST: 12 U/L (ref 0–37)
Albumin: 3.5 g/dL (ref 3.5–5.2)
Alkaline Phosphatase: 174 U/L — ABNORMAL HIGH (ref 39–117)
BUN: 14 mg/dL (ref 6–23)
CALCIUM: 9 mg/dL (ref 8.4–10.5)
CO2: 21 meq/L (ref 19–32)
CREATININE: 1.1 mg/dL (ref 0.50–1.35)
Chloride: 106 mEq/L (ref 96–112)
GFR calc Af Amer: >90 mL/min (ref >90)
GFR calc non Af Amer: 83 mL/min — ABNORMAL LOW (ref >90)
Glucose, Bld: 83 mg/dL (ref 70–99)
Potassium: 4.2 mEq/L (ref 3.7–5.3)
SODIUM: 138 meq/L (ref 137–147)
TOTAL PROTEIN: 7.5 g/dL (ref 6.0–8.3)
Total Bilirubin: 0.4 mg/dL (ref 0.3–1.2)

## 2014-06-06 LAB — LIPASE, BLOOD: LIPASE: 84 U/L — AB (ref 11–59)

## 2014-06-06 LAB — CBG MONITORING, ED: GLUCOSE-CAPILLARY: 80 mg/dL (ref 70–99)

## 2014-06-06 MED ORDER — DICYCLOMINE HCL 20 MG PO TABS
20.0000 mg | ORAL_TABLET | Freq: Once | ORAL | Status: DC
  Filled 2014-06-06: qty 1

## 2014-06-06 MED ORDER — OXYCODONE HCL 5 MG PO TABS
10.0000 mg | ORAL_TABLET | Freq: Once | ORAL | Status: DC
  Filled 2014-06-06: qty 2

## 2014-06-06 MED ORDER — OMEPRAZOLE 20 MG PO CPDR: 20.0000 mg | DELAYED_RELEASE_CAPSULE | Freq: Every day | ORAL | Status: DC | PRN

## 2014-06-06 MED ORDER — ONDANSETRON 8 MG PO TBDP
8.0000 mg | ORAL_TABLET | Freq: Once | ORAL | Status: AC
  Administered 2014-06-06: 8 mg via ORAL
  Filled 2014-06-06: qty 1

## 2014-06-06 MED ORDER — DICYCLOMINE HCL 20 MG PO TABS: 20.0000 mg | ORAL_TABLET | Freq: Four times a day (QID) | ORAL | Status: DC | PRN

## 2014-06-06 MED ORDER — ONDANSETRON 8 MG PO TBDP: 8.0000 mg | ORAL_TABLET | Freq: Three times a day (TID) | ORAL | Status: DC | PRN

## 2014-06-06 MED ORDER — FENTANYL CITRATE 0.05 MG/ML IJ SOLN
50.0000 ug | Freq: Once | INTRAMUSCULAR | Status: AC
  Administered 2014-06-06: 50 ug via INTRAVENOUS
  Filled 2014-06-06: qty 2

## 2014-06-06 NOTE — ED Notes (Signed)
Pt with alleyvn dressing applied to left lower stump

## 2014-06-06 NOTE — Discharge Instructions (Signed)
Diarrhea Diarrhea is frequent loose and watery bowel movements. It can cause you to feel weak and dehydrated. Dehydration can cause you to become tired and thirsty, have a dry mouth, and have decreased urination that often is dark yellow. Diarrhea is a sign of another problem, most often an infection that will not last long. In most cases, diarrhea typically lasts 2 3 days. However, it can last longer if it is a sign of something more serious. It is important to treat your diarrhea as directed by your caregive to lessen or prevent future episodes of diarrhea. CAUSES  Some common causes include:  Gastrointestinal infections caused by viruses, bacteria, or parasites.  Food poisoning or food allergies.  Certain medicines, such as antibiotics, chemotherapy, and laxatives.  Artificial sweeteners and fructose.  Digestive disorders. HOME CARE INSTRUCTIONS  Ensure adequate fluid intake (hydration): have 1 cup (8 oz) of fluid for each diarrhea episode. Avoid fluids that contain simple sugars or sports drinks, fruit juices, whole milk products, and sodas. Your urine should be clear or pale yellow if you are drinking enough fluids. Hydrate with an oral rehydration solution that you can purchase at pharmacies, retail stores, and online. You can prepare an oral rehydration solution at home by mixing the following ingredients together:    tsp table salt.   tsp baking soda.   tsp salt substitute containing potassium chloride.  1  tablespoons sugar.  1 L (34 oz) of water.  Certain foods and beverages may increase the speed at which food moves through the gastrointestinal (GI) tract. These foods and beverages should be avoided and include:  Caffeinated and alcoholic beverages.  High-fiber foods, such as raw fruits and vegetables, nuts, seeds, and whole grain breads and cereals.  Foods and beverages sweetened with sugar alcohols, such as xylitol, sorbitol, and mannitol.  Some foods may be well  tolerated and may help thicken stool including:  Starchy foods, such as rice, toast, pasta, low-sugar cereal, oatmeal, grits, baked potatoes, crackers, and bagels.  Bananas.  Applesauce.  Add probiotic-rich foods to help increase healthy bacteria in the GI tract, such as yogurt and fermented milk products.  Wash your hands well after each diarrhea episode.  Only take over-the-counter or prescription medicines as directed by your caregiver.  Take a warm bath to relieve any burning or pain from frequent diarrhea episodes. SEEK IMMEDIATE MEDICAL CARE IF:   You are unable to keep fluids down.  You have persistent vomiting.  You have blood in your stool, or your stools are black and tarry.  You do not urinate in 6 8 hours, or there is only a small amount of very dark urine.  You have abdominal pain that increases or localizes.  You have weakness, dizziness, confusion, or lightheadedness.  You have a severe headache.  Your diarrhea gets worse or does not get better.  You have a fever or persistent symptoms for more than 2 3 days.  You have a fever and your symptoms suddenly get worse. MAKE SURE YOU:   Understand these instructions.  Will watch your condition.  Will get help right away if you are not doing well or get worse. Document Released: 12/02/2002 Document Revised: 11/28/2012 Document Reviewed: 08/19/2012 [facility] Patient Information 2014 [place], [place].  Diet for Diarrhea, Adult Frequent, runny stools (diarrhea) may be caused or worsened by food or drink. Diarrhea may be relieved by changing your diet. Since diarrhea can last up to 7 days, it is easy for you to lose too much  fluid from the body and become dehydrated. Fluids that are lost need to be replaced. Along with a modified diet, make sure you drink enough fluids to keep your urine clear or pale yellow. DIET INSTRUCTIONS  Ensure adequate fluid intake (hydration): have 1 cup (8 oz) of fluid for each diarrhea  episode. Avoid fluids that contain simple sugars or sports drinks, fruit juices, whole milk products, and sodas. Your urine should be clear or pale yellow if you are drinking enough fluids. Hydrate with an oral rehydration solution that you can purchase at pharmacies, retail stores, and online. You can prepare an oral rehydration solution at home by mixing the following ingredients together:    tsp table salt.   tsp baking soda.   tsp salt substitute containing potassium chloride.  1  tablespoons sugar.  1 L (34 oz) of water.  Certain foods and beverages may increase the speed at which food moves through the gastrointestinal (GI) tract. These foods and beverages should be avoided and include:  Caffeinated and alcoholic beverages.  High-fiber foods, such as raw fruits and vegetables, nuts, seeds, and whole grain breads and cereals.  Foods and beverages sweetened with sugar alcohols, such as xylitol, sorbitol, and mannitol.  Some foods may be well tolerated and may help thicken stool including:  Starchy foods, such as rice, toast, pasta, low-sugar cereal, oatmeal, grits, baked potatoes, crackers, and bagels.   Bananas.   Applesauce.  Add probiotic-rich foods to help increase healthy bacteria in the GI tract, such as yogurt and fermented milk products. RECOMMENDED FOODS AND BEVERAGES Starches Choose foods with less than 2 g of fiber per serving.  Recommended:  White, [place], and pita breads, plain rolls, buns, bagels. Plain muffins, matzo. Soda, saltine, or graham crackers. Pretzels, melba toast, zwieback. Cooked cereals made with water: cornmeal, farina, cream cereals. Dry cereals: refined corn, wheat, rice. Potatoes prepared any way without skins, refined macaroni, spaghetti, noodles, refined rice.  Avoid:  Bread, rolls, or crackers made with whole wheat, multi-grains, rye, bran seeds, nuts, or coconut. Corn tortillas or taco shells. Cereals containing whole grains, multi-grains,  bran, coconut, nuts, raisins. Cooked or dry oatmeal. Coarse wheat cereals, granola. Cereals advertised as "high-fiber." Potato skins. Whole grain pasta, wild or brown rice. Popcorn. Sweet potatoes, yams. Sweet rolls, doughnuts, waffles, pancakes, sweet breads. Vegetables  Recommended: Strained tomato and vegetable juices. Most well-cooked and canned vegetables without seeds. Fresh: Tender lettuce, cucumber without the skin, cabbage, spinach, bean sprouts.  Avoid: Fresh, cooked, or canned: Artichokes, baked beans, beet greens, broccoli, Brussels sprouts, corn, kale, legumes, peas, sweet potatoes. Cooked: Green or red cabbage, spinach. Avoid large servings of any vegetables because vegetables shrink when cooked, and they contain more fiber per serving than fresh vegetables. Fruit  Recommended: Cooked or canned: Apricots, applesauce, cantaloupe, cherries, fruit cocktail, grapefruit, grapes, kiwi, mandarin oranges, peaches, pears, plums, watermelon. Fresh: Apples without skin, ripe banana, grapes, cantaloupe, cherries, grapefruit, peaches, oranges, plums. Keep servings limited to  cup or 1 piece.  Avoid: Fresh: Apples with skin, apricots, mangoes, pears, raspberries, strawberries. Prune juice, stewed or dried prunes. Dried fruits, raisins, dates. Large servings of all fresh fruits. Protein  Recommended: Ground or well-cooked tender beef, ham, veal, lamb, pork, or poultry. Eggs. Fish, oysters, shrimp, lobster, other seafoods. Liver, organ meats.  Avoid: Tough, fibrous meats with gristle. Peanut butter, smooth or chunky. Cheese, nuts, seeds, legumes, dried peas, beans, lentils. Dairy  Recommended: Yogurt, lactose-free milk, kefir, drinkable yogurt, buttermilk, soy milk, or plain hard cheese.  Avoid: Milk, chocolate milk, beverages made with milk, such as milkshakes. Soups  Recommended: Bouillon, broth, or soups made from allowed foods. Any strained soup.  Avoid: Soups made from vegetables that  are not allowed, cream or milk-based soups. Desserts and Sweets  Recommended: Sugar-free gelatin, sugar-free frozen ice pops made without sugar alcohol.  Avoid: Plain cakes and cookies, pie made with fruit, pudding, custard, cream pie. Gelatin, fruit, ice, sherbet, frozen ice pops. Ice cream, ice milk without nuts. Plain hard candy, honey, jelly, molasses, syrup, sugar, chocolate syrup, gumdrops, marshmallows. Fats and Oils  Recommended: Limit fats to less than 8 tsp per day.  Avoid: Seeds, nuts, olives, avocados. Margarine, butter, cream, mayonnaise, salad oils, plain salad dressings. Plain gravy, crisp bacon without rind. Beverages  Recommended: Water, decaffeinated teas, oral rehydration solutions, sugar-free beverages not sweetened with sugar alcohols.  Avoid: Fruit juices, caffeinated beverages (coffee, tea, soda), alcohol, sports drinks, or lemon-lime soda. Condiments  Recommended: Ketchup, mustard, horseradish, vinegar, cocoa powder. Spices in moderation: allspice, basil, bay leaves, celery powder or leaves, cinnamon, cumin powder, curry powder, ginger, mace, marjoram, onion or garlic powder, oregano, paprika, parsley flakes, ground pepper, rosemary, sage, savory, tarragon, thyme, turmeric.  Avoid: Coconut, honey. Document Released: 03/03/2004 Document Revised: 09/05/2012 Document Reviewed: 04/27/2012 [facility] Patient Information 2014 [place].  Nausea and Vomiting Nausea is a sick feeling that often comes before throwing up (vomiting). Vomiting is a reflex where stomach contents come out of your mouth. Vomiting can cause severe loss of body fluids (dehydration). Children and elderly adults can become dehydrated quickly, especially if they also have diarrhea. Nausea and vomiting are symptoms of a condition or disease. It is important to find the cause of your symptoms. CAUSES   Direct irritation of the stomach lining. This irritation can result from increased acid production  (gastroesophageal reflux disease), infection, food poisoning, taking certain medicines (such as nonsteroidal anti-inflammatory drugs), alcohol use, or tobacco use.  Signals from the brain.These signals could be caused by a headache, heat exposure, an inner ear disturbance, increased pressure in the brain from injury, infection, a tumor, or a concussion, pain, emotional stimulus, or metabolic problems.  An obstruction in the gastrointestinal tract (bowel obstruction).  Illnesses such as diabetes, hepatitis, gallbladder problems, appendicitis, kidney problems, cancer, sepsis, atypical symptoms of a heart attack, or eating disorders.  Medical treatments such as chemotherapy and radiation.  Receiving medicine that makes you sleep (general anesthetic) during surgery. DIAGNOSIS Your caregiver may ask for tests to be done if the problems do not improve after a few days. Tests may also be done if symptoms are severe or if the reason for the nausea and vomiting is not clear. Tests may include:  Urine tests.  Blood tests.  Stool tests.  Cultures (to look for evidence of infection).  X-rays or other imaging studies. Test results can help your caregiver make decisions about treatment or the need for additional tests. TREATMENT You need to stay well hydrated. Drink frequently but in small amounts.You may wish to drink water, sports drinks, clear broth, or eat frozen ice pops or gelatin dessert to help stay hydrated.When you eat, eating slowly may help prevent nausea.There are also some antinausea medicines that may help prevent nausea. HOME CARE INSTRUCTIONS   Take all medicine as directed by your caregiver.  If you do not have an appetite, do not force yourself to eat. However, you must continue to drink fluids.  If you have an appetite, eat a normal diet unless your caregiver  tells you differently.  Eat a variety of complex carbohydrates (rice, wheat, potatoes, bread), lean meats, yogurt,  fruits, and vegetables.  Avoid high-fat foods because they are more difficult to digest.  Drink enough water and fluids to keep your urine clear or pale yellow.  If you are dehydrated, ask your caregiver for specific rehydration instructions. Signs of dehydration may include:  Severe thirst.  Dry lips and mouth.  Dizziness.  Dark urine.  Decreasing urine frequency and amount.  Confusion.  Rapid breathing or pulse. SEEK IMMEDIATE MEDICAL CARE IF:   You have blood or brown flecks (like coffee grounds) in your vomit.  You have black or bloody stools.  You have a severe headache or stiff neck.  You are confused.  You have severe abdominal pain.  You have chest pain or trouble breathing.  You do not urinate at least once every 8 hours.  You develop cold or clammy skin.  You continue to vomit for longer than 24 to 48 hours.  You have a fever. MAKE SURE YOU:   Understand these instructions.  Will watch your condition.  Will get help right away if you are not doing well or get worse. Document Released: 12/12/2005 Document Revised: 03/05/2012 Document Reviewed: 05/11/2011 [facility] Patient Information 2014 [place], [place].

## 2014-06-06 NOTE — ED Notes (Signed)
Patient asking for pain medication Patient refusing PO medication that has been ordered, states "I want something in my IV." Will make EDP aware

## 2014-06-06 NOTE — ED Provider Notes (Signed)
CSN: [id number]     Arrival date & time 06/05/14  2308 History   First MD Initiated Contact with Patient 06/06/14 0149     Chief Complaint  Patient presents with  . Abdominal Pain  . Diarrhea  . Dehydration     (Consider location/radiation/quality/duration/timing/severity/associated sxs/prior Treatment) HPI 38 year old male presents to the emergency department with complaint of diarrhea, stomach cramping, nausea and vomiting.  Nausea and vomiting happened today.  Patient reports that his apartment is on your condition and he has been very hot recently.  He reports that he has E. and drinking, but does not feel that he can keep up with his fluids.  Patient has history of bilateral BKA, and reports that his stumps have been bothering him.  He was seen in practice Center last week for a spot that has become raw, and since that time has had other areas and popped up.  Patient recently on doxycycline to cover for infection in his stumps.  No fevers or chills.  Patient has history of pancreas kidney transplant.  He reports that he's been doing well since that time, no longer on dialysis.   Past Medical History  Diagnosis Date  . Hypertension   . GERD (gastroesophageal reflux disease)   . Gastropathy   . Chronic pain   . Depression   . Edema   . Headache(784.0)   . 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 Dialysis patient 04/18/12    "[facility]; [place], [place], [place]"  . Blood transfusion   . Arthritis     "I think I do; just in my fingers & my hands"  . Type I diabetes mellitus     "juvenile"  . Gastroparesis   . ESRD (end stage renal disease) on dialysis    Past Surgical History  Procedure Laterality Date  . Below knee leg amputation  "it's been awhile"    bilaterally  . Cataract extraction  ~ 2011    right  . Av fistula placement  08/2011    left upper arm  . Combined kidney-pancreas transplant     Family History  Problem Relation Age of Onset  . Diabetes  Other   . Hypertension Other    History  Substance Use Topics  . Smoking status: Never Smoker   . Smokeless tobacco: Never Used  . Alcohol Use: No    Review of Systems   See History of Present Illness; otherwise all other systems are reviewed and negative  Allergies  Review of patient's allergies indicates no known allergies.  Home Medications   Prior to Admission medications   Medication Sig Start Date End Date Taking? Authorizing Provider  aspirin EC 81 MG tablet Take 81 mg by mouth daily.   Yes Historical Provider, MD  doxycycline (VIBRA-TABS) 100 MG tablet Take 1 tablet (100 mg total) by mouth 2 (two) times daily. 05/30/14  Yes Gerda Diss, DO  mycophenolate (MYFORTIC) 180 MG EC tablet Take 720 mg by mouth 2 (two) times daily.  07/06/13  Yes Historical Provider, MD  omeprazole (PRILOSEC) 20 MG capsule Take 20 mg by mouth daily as needed (heartburn).    Yes Historical Provider, MD  predniSONE (DELTASONE) 5 MG tablet Take 5 mg by mouth daily.  06/20/13  Yes Historical Provider, MD  promethazine (PHENERGAN) 12.5 MG tablet Take 1 tablet (12.5 mg total) by mouth every 6 (six) hours as needed for nausea or vomiting. 02/25/14  Yes [name], MD  sulfamethoxazole-trimethoprim Octavio Graves)  400-80 MG per tablet Take 1 tablet by mouth every Monday, Wednesday, and Friday.    Yes Historical Provider, MD  tacrolimus (PROGRAF) 1 MG capsule Take 1-2 mg by mouth 2 (two) times daily. Take 2 capsules by mouth in the AM and 1 capsule in the evening 07/16/13  Yes Historical Provider, MD  HYDROcodone-acetaminophen (NORCO) 10-325 MG per tablet Take 1 tablet by mouth every 8 (eight) hours as needed. 05/30/14   Gerda Diss, DO  promethazine (PHENERGAN) 25 MG suppository Place 1 suppository (25 mg total) rectally every 6 (six) hours as needed for nausea or vomiting. 04/23/14   Tatyana A Kirichenko, PA-C   BP 160/95  Pulse 75  Temp(Src) 98 F (36.7 C) (Oral)  Resp 18  SpO2 100% Physical Exam   Nursing note and vitals reviewed. Constitutional: He is oriented to person, place, and time. He appears well-developed and well-nourished.  HENT:  Head: Normocephalic and atraumatic.  Right Ear: External ear normal.  Left Ear: External ear normal.  Nose: Nose normal.  Mouth/Throat: Oropharynx is clear and moist.  Eyes: Conjunctivae and EOM are normal. Pupils are equal, round, and reactive to light.  Neck: Normal range of motion. Neck supple. No JVD present. No tracheal deviation present. No thyromegaly present.  Cardiovascular: Normal rate, regular rhythm, normal heart sounds and intact distal pulses.  Exam reveals no gallop and no friction rub.   No murmur heard. Pulmonary/Chest: Effort normal and breath sounds normal. No stridor. No respiratory distress. He has no wheezes. He has no rales. He exhibits no tenderness.  Abdominal: Soft. Bowel sounds are normal. He exhibits no distension and no mass. There is tenderness (mild diffuse tenderness). There is no rebound and no guarding.  Musculoskeletal: Normal range of motion. He exhibits no edema and no tenderness.  Patient has healing ulcer on the left anterior aspect of his left stump.  He has a 10 out of the blister to the left lateral aspect without drainage or erythema.  On his right stump he has healing ulcers that look to be in good condition.  Lymphadenopathy:    He has no cervical adenopathy.  Neurological: He is alert and oriented to person, place, and time. He has normal reflexes. No cranial nerve deficit. He exhibits normal muscle tone. Coordination normal.  Skin: Skin is warm and dry. No rash noted. No erythema. No pallor.  Psychiatric: He has a normal mood and affect. His behavior is normal. Judgment and thought content normal.    ED Course  Procedures (including critical care time) Labs Review Labs Reviewed  CBC WITH DIFFERENTIAL - Abnormal; Notable for the following:    WBC 3.7 (*)    Monocytes Relative 22 (*)    Basophils  Relative 3 (*)    Lymphs Abs 0.6 (*)    All other components within normal limits  COMPREHENSIVE METABOLIC PANEL - Abnormal; Notable for the following:    Alkaline Phosphatase 174 (*)    GFR calc non Af Amer 83 (*)    All other components within normal limits  LIPASE, BLOOD - Abnormal; Notable for the following:    Lipase 84 (*)    All other components within normal limits  URINALYSIS, ROUTINE W REFLEX MICROSCOPIC - Abnormal; Notable for the following:    APPearance CLOUDY (*)    All other components within normal limits  CBG MONITORING, ED    Imaging Review No results found.   EKG Interpretation None      MDM   Final  diagnoses:  Nausea vomiting and diarrhea    38 year old male with 2-3 days of loose stools, stomach upset.  He is recently been on doxycycline which may be cause of his symptoms.  No signs of infection to his stumps.  Labs unremarkable.  Patient clinically does not appear dehydrated.  Plan to give Bentyl and pain medication and patient can followup with the practice clinic.    Kalman Drape, MD 06/06/14 [phone]

## 2014-06-20 ENCOUNTER — Encounter: Payer: Self-pay | Admitting: Sports Medicine

## 2014-07-02 ENCOUNTER — Ambulatory Visit (INDEPENDENT_AMBULATORY_CARE_PROVIDER_SITE_OTHER): Payer: Medicare Other | Admitting: Family Medicine

## 2014-07-02 ENCOUNTER — Encounter: Payer: Self-pay | Admitting: Family Medicine

## 2014-07-02 VITALS — BP 127/84 | HR 93 | Temp 98.7°F | Wt 153.6 lb

## 2014-07-02 DIAGNOSIS — Z89512 Acquired absence of left leg below knee: Secondary | ICD-10-CM

## 2014-07-02 DIAGNOSIS — Z89511 Acquired absence of right leg below knee: Secondary | ICD-10-CM

## 2014-07-02 DIAGNOSIS — S88119A Complete traumatic amputation at level between knee and ankle, unspecified lower leg, initial encounter: Secondary | ICD-10-CM | POA: Diagnosis not present

## 2014-07-02 DIAGNOSIS — Z7409 Other reduced mobility: Secondary | ICD-10-CM

## 2014-07-02 DIAGNOSIS — R69 Illness, unspecified: Secondary | ICD-10-CM | POA: Diagnosis not present

## 2014-07-02 DIAGNOSIS — R29898 Other symptoms and signs involving the musculoskeletal system: Secondary | ICD-10-CM

## 2014-07-02 NOTE — Progress Notes (Signed)
Subjective:    Patient ID: [name], male    DOB: [birth year], 38 y.o.   MRN: [id number]  HPI For Power Wheelchair assessment/Mobility examination. Has had power wheelchair x 10 years.  Stolen after only one year.  Has been trying to get by with manual wheelchair.  Because of arm weakness, Left>Rt, he has limited mobility in the home using manual chair.  Cannot use walker because of skin breakdown of stumps and limited strength in lower extremities.  A scooter is not appropriate because home environment not conducive to turning radius.  Also, weakness of legs prevents straddling.  Power wheel chair will allow to move from room to room for his ADLs.  Currently crawling from room to room.  Patient does have the physical and mental capabilities to operate a power wheel chair.  He is also willing and motivated to use a power wheel chair.   It is unclear to me why, beyond his history of left frozen shoulder, he should have predominent left upper ext weakness.  Specifically denies old CVA.     Review of Systems  Remarkable story:  He has done great since a combined kidney, pancreas transplant and tells me he is no longer diabetic.  I know him from his wilder, younger days and I am totally impressed by the serious young man talking to me today.     Objective:   Physical ExamS/P bilateral BKAs.  Has protheses. Cannot walk more than a few steps.  Having problems with both weakness and skin breakdown of stumps.    Muscle group                     Strength Rt       Strength Lt  (Rating 0, no movement, 5 nl strength)  Quads                                     3                      3 Shoulder abductors                4                      3 biceps                                     4-5                   3 Triceps                                    4-5                   3 Wrist flex                                  4                     3 Wrist ext  4                       3 Grip                                          5                      5  Only limited range of motion was of left shoulder he can only abduct to 90 degrees Neuro exam is normal sensation, DN2-12 and reflexes.  Strength as described above Cardiac RRR without m or gallop.  Gait - only able to walk a few feet with his protheses.        Assessment & Plan:

## 2014-07-02 NOTE — Patient Instructions (Signed)
It is great to see you doing so well. I am serious that I might want to use your story for a conference some time. I support your application for a power wheelchair - you meet the criteria by my exam. Stay with Dr. Awanda Mink for follow up Keep taking care of yourself.

## 2014-07-03 ENCOUNTER — Encounter (HOSPITAL_BASED_OUTPATIENT_CLINIC_OR_DEPARTMENT_OTHER): Payer: Medicare Other | Attending: Internal Medicine

## 2014-07-03 DIAGNOSIS — Z7409 Other reduced mobility: Secondary | ICD-10-CM | POA: Insufficient documentation

## 2014-07-03 DIAGNOSIS — R29898 Other symptoms and signs involving the musculoskeletal system: Secondary | ICD-10-CM | POA: Insufficient documentation

## 2014-07-03 NOTE — Assessment & Plan Note (Signed)
Now with some skin breakdown around stumps.

## 2014-07-29 ENCOUNTER — Ambulatory Visit (INDEPENDENT_AMBULATORY_CARE_PROVIDER_SITE_OTHER): Payer: Medicare Other | Admitting: Family Medicine

## 2014-07-29 ENCOUNTER — Encounter: Payer: Self-pay | Admitting: Family Medicine

## 2014-07-29 VITALS — BP 136/90 | HR 88 | Temp 98.0°F | Wt 159.0 lb

## 2014-07-29 DIAGNOSIS — Z9483 Pancreas transplant status: Secondary | ICD-10-CM

## 2014-07-29 DIAGNOSIS — L8992 Pressure ulcer of unspecified site, stage 2: Secondary | ICD-10-CM

## 2014-07-29 DIAGNOSIS — L8990 Pressure ulcer of unspecified site, unspecified stage: Secondary | ICD-10-CM

## 2014-07-29 MED ORDER — LEVOFLOXACIN 750 MG PO TABS: 750.0000 mg | ORAL_TABLET | Freq: Every day | ORAL | Status: DC

## 2014-07-29 MED ORDER — DOXYCYCLINE HYCLATE 100 MG PO TABS: 100.0000 mg | ORAL_TABLET | Freq: Two times a day (BID) | ORAL | Status: DC

## 2014-07-29 NOTE — Assessment & Plan Note (Signed)
Continue immunosuppressant medications.

## 2014-07-29 NOTE — Patient Instructions (Signed)
Pressure Ulcer A pressure ulcer is a sore that has formed from the breakdown of skin and exposure of deeper layers of tissue. It develops in areas of the body where there is unrelieved pressure. Pressure ulcers are usually found over a bony area, such as the shoulder blades, spine, lower back, hips, knees, ankles, and heels. Pressure ulcers vary in severity. Your health care provider may determine the severity (stage) of your pressure ulcer. The stages include:  Stage I--The skin is red, and when the skin is pressed, it stays red.  Stage II--The top layer of skin is gone, and there is a shallow, pink ulcer.  Stage III--The ulcer becomes deeper, and it is more difficult to see the whole wound. Also, there may be yellow or brown parts, as well as pink and red parts.  Stage IV--The ulcer may be deep and red, pink, brown, white, or yellow. Bone or muscle may be seen.  Unstageable pressure ulcer--The ulcer is covered almost completely with black, brown, or yellow tissue. It is not known how deep the ulcer is or what stage it is until this covering comes off.  Suspected deep tissue injury--A person's skin can be injured from pressure or pulling on the skin when his or her position is changed. The skin appears purple or maroon. There may not be an opening in the skin, but there could be a blood-filled blister. This deep tissue injury is often difficult to see in people with darker skin tones. The site may open and become deeper in time. However, early interventions will help the area heal and may prevent the area from opening. CAUSES  Pressure ulcers are caused by pressure against the skin that limits the flow of blood to the skin and nearby tissues. There are many risk factors that can lead to pressure sores. RISK FACTORS  Decreased ability to move.  Decreased ability to feel pain or discomfort.  Excessive skin moisture from urine, stool, sweat, or secretions.  Poor  nutrition.  Dehydration.  Tobacco, drug, or alcohol abuse.  Having someone pull on bedsheets that are under you, such as when health care workers are changing your position in a hospital bed.  Obesity.  Increased adult age.  Hospitalization in a critical care unit for longer than 4 days with use of medical devices.  Prolonged use of medical devices.  Critical illness.  Anemia.  Traumatic brain injury.  Spinal cord injury.  Stroke.  Diabetes.  Poor blood glucose control.  Low blood pressure (hypotension).  Low oxygen levels.  Medicines that reduce blood flow.  Infection. DIAGNOSIS  Your health care provider will diagnose your pressure ulcer based on its appearance. The health care provider may determine the stage of your pressure ulcer as well. Tests may be done to check for infection, to assess your circulation, or to check for other diseases, such as diabetes. TREATMENT  Treatment of your pressure ulcer begins with determining what stage the ulcer is in. Your treatment team may include your health care provider, a wound care specialist, a nutritionist, a physical therapist, and a surgeon. Possible treatments may include:   Moving or repositioning every 1-2 hours.  Using beds or mattresses to shift your body weight and pressure points frequently.  Improving your diet.  Cleaning and bandaging (dressing) the open wound.  Giving antibiotic medicines.  Removing damaged tissue.  Surgery and sometimes skin grafts. HOME CARE INSTRUCTIONS  If you were hospitalized, follow the care plan that was started in the hospital.    Avoid staying in the same position for more than 2 hours. Use padding, devices, or mattresses to cushion your pressure points as directed by your health care provider.  Eat a well-balanced diet. Take nutritional supplements and vitamins as directed by your health care provider.  Keep all follow-up appointments.  Only take over-the-counter or  prescription medicines for pain, fever, or discomfort as directed by your health care provider. SEEK MEDICAL CARE IF:   Your pressure ulcer is not improving.  You do not know how to care for your pressure ulcer.  You notice other areas of redness on your skin.  You have a fever. SEEK IMMEDIATE MEDICAL CARE IF:   You have increasing redness, swelling, or pain in your pressure ulcer.  You notice pus coming from your pressure ulcer.  You notice a bad smell coming from the wound or dressing.  Your pressure ulcer opens up again. Document Released: 12/12/2005 Document Revised: 12/17/2013 Document Reviewed: 08/19/2013 [employer] Patient Information 2015 [employer]. This information is not intended to replace advice given to you by your health care provider. Make sure you discuss any questions you have with your health care provider.  

## 2014-07-29 NOTE — Progress Notes (Signed)
[name] is a 38 y.o. male who presents today for lower extremity ulceration of the L knee, lateral aspect, BKA complication.  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  This has been ongoing for several months w/o resolution despite placing bandages/etc on this area.  He denies any fevers, chills but has been having some nightsweats occasionally.  Currently in process for electronic wheelchair 2/2 his chronic MSK UE conditions.    Immunocompromised - Pt with hx of pancreatic and kidney tx done about one yr ago at [facility], now on Prograf and other immunosuppressant medications.  Doing well, f/u with renal and transplant services.   Past Medical History  Diagnosis Date  . Hypertension   . GERD (gastroesophageal reflux disease)   . Gastropathy   . Chronic pain   . Depression   . Edema   . Headache(784.0)   . 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 Dialysis patient 04/18/12    "[facility]; [place], [place], [place]"  . Blood transfusion   . Arthritis     "I think I do; just in my fingers & my hands"  . Type I diabetes mellitus     "juvenile"  . Gastroparesis   . ESRD (end stage renal disease) on dialysis     History  Smoking status  . Never Smoker   Smokeless tobacco  . Never Used    Family History  Problem Relation Age of Onset  . Diabetes Other   . Hypertension Other     Current Outpatient Prescriptions on File Prior to Visit  Medication Sig Dispense Refill  . aspirin EC 81 MG tablet Take 81 mg by mouth daily.      [name] dicyclomine (BENTYL) 20 MG tablet Take 1 tablet (20 mg total) by mouth every 6 (six) hours as needed for spasms (for abdominal cramping).  20 tablet  0  . doxycycline (VIBRA-TABS) 100 MG tablet Take 1 tablet (100 mg total) by mouth 2 (two) times daily.  20 tablet  0  . HYDROcodone-acetaminophen (NORCO) 10-325 MG  per tablet Take 1 tablet by mouth every 8 (eight) hours as needed.  30 tablet  0  . mycophenolate (MYFORTIC) 180 MG EC tablet Take 720 mg by mouth 2 (two) times daily.       [name] omeprazole (PRILOSEC) 20 MG capsule Take 1 capsule (20 mg total) by mouth daily as needed (heartburn).  30 capsule  0  . ondansetron (ZOFRAN ODT) 8 MG disintegrating tablet Take 1 tablet (8 mg total) by mouth every 8 (eight) hours as needed for nausea or vomiting.  20 tablet  0  . predniSONE (DELTASONE) 5 MG tablet Take 5 mg by mouth daily.       . promethazine (PHENERGAN) 12.5 MG tablet Take 1 tablet (12.5 mg total) by mouth every 6 (six) hours as needed for nausea or vomiting.  20 tablet  0  . promethazine (PHENERGAN) 25 MG suppository Place 1 suppository (25 mg total) rectally every 6 (six) hours as needed for nausea or vomiting.  12 each  0  . sulfamethoxazole-trimethoprim (BACTRIM,SEPTRA) 400-80 MG per tablet Take 1 tablet by mouth every Monday, Wednesday, and Friday.       . tacrolimus (PROGRAF) 1 MG capsule Take 1-2 mg by mouth 2 (two) times daily. Take 2 capsules by mouth in the AM and 1 capsule in the evening  No current facility-administered medications on file prior to visit.    ROS: Per HPI.  All other systems reviewed and are negative.   Physical Exam Filed Vitals:   07/29/14 1355  BP: 136/90  Pulse: 88  Temp: 98 F (36.7 C)    Physical Examination: General appearance - alert, well appearing, and in no distress Neurological - No focal findings B/L  Extremities - BKA B/L  Skin - 2 x 2 cm ulceration with no drainage or soft tissue present on the lateral aspect.  No surrounding erythema, warmth, TTP.      Chemistry      Component Value Date/Time   NA 138 06/06/2014 0208   K 4.2 06/06/2014 0208   CL 106 06/06/2014 0208   CO2 21 06/06/2014 0208   BUN 14 06/06/2014 0208   CREATININE 1.10 06/06/2014 0208   CREATININE 1.20 05/30/2014 1415      Component Value Date/Time   CALCIUM 9.0 06/06/2014 0208    CALCIUM 8.4 09/08/2011 0746   ALKPHOS 174* 06/06/2014 0208   AST 12 06/06/2014 0208   ALT 10 06/06/2014 0208   BILITOT 0.4 06/06/2014 0208      Lab Results  Component Value Date   WBC 3.7* 06/06/2014   HGB 16.7 06/06/2014   HCT 51.4 06/06/2014   MCV 94.1 06/06/2014   PLT 189 06/06/2014   Lab Results  Component Value Date   TSH 0.867 03/06/2008   Lab Results  Component Value Date   HGBA1C 5.4 02/25/2014

## 2014-07-29 NOTE — Assessment & Plan Note (Signed)
Will refer to wound care for further management.  As well, will place on doxy/levaquin x 10 days (renal fxn is good), and see back in 10 days.  As well, will place duoderm patch with vaseline on area with gauze or absorption/padding until can be seen.  Pt is immunocompromised (on Procrit 2/2 transplant) which is why we will follow him closely.  F/U in 5-7 days, return precautions outlined, pt understanding.

## 2014-08-06 ENCOUNTER — Ambulatory Visit: Payer: Medicare Other | Admitting: Family Medicine

## 2014-08-11 ENCOUNTER — Encounter (HOSPITAL_BASED_OUTPATIENT_CLINIC_OR_DEPARTMENT_OTHER): Payer: Medicare Other | Attending: Plastic Surgery

## 2014-09-04 ENCOUNTER — Telehealth: Payer: Self-pay | Admitting: Family Medicine

## 2014-09-04 NOTE — Telephone Encounter (Signed)
NPI given for [facility] Urology Dept--Katie One time authorization given

## 2014-09-07 ENCOUNTER — Encounter (HOSPITAL_COMMUNITY): Payer: Self-pay | Admitting: Emergency Medicine

## 2014-09-07 ENCOUNTER — Emergency Department (HOSPITAL_COMMUNITY): Payer: Medicare Other

## 2014-09-07 ENCOUNTER — Emergency Department (HOSPITAL_COMMUNITY)
Admission: EM | Admit: 2014-09-07 | Discharge: 2014-09-08 | Disposition: A | Discharge status: Home / Self Care | Payer: Medicare Other | Source: Home / Self Care | Attending: Emergency Medicine | Admitting: Emergency Medicine

## 2014-09-07 DIAGNOSIS — Z791 Long term (current) use of non-steroidal anti-inflammatories (NSAID): Secondary | ICD-10-CM | POA: Insufficient documentation

## 2014-09-07 DIAGNOSIS — Z7982 Long term (current) use of aspirin: Secondary | ICD-10-CM | POA: Insufficient documentation

## 2014-09-07 DIAGNOSIS — R112 Nausea with vomiting, unspecified: Secondary | ICD-10-CM

## 2014-09-07 DIAGNOSIS — S88119A Complete traumatic amputation at level between knee and ankle, unspecified lower leg, initial encounter: Secondary | ICD-10-CM | POA: Insufficient documentation

## 2014-09-07 DIAGNOSIS — Z8659 Personal history of other mental and behavioral disorders: Secondary | ICD-10-CM

## 2014-09-07 DIAGNOSIS — R1110 Vomiting, unspecified: Secondary | ICD-10-CM

## 2014-09-07 DIAGNOSIS — G8929 Other chronic pain: Secondary | ICD-10-CM | POA: Insufficient documentation

## 2014-09-07 DIAGNOSIS — Z792 Long term (current) use of antibiotics: Secondary | ICD-10-CM

## 2014-09-07 DIAGNOSIS — N186 End stage renal disease: Secondary | ICD-10-CM

## 2014-09-07 DIAGNOSIS — R000 Tachycardia, unspecified: Secondary | ICD-10-CM | POA: Insufficient documentation

## 2014-09-07 DIAGNOSIS — Z9889 Other specified postprocedural states: Secondary | ICD-10-CM | POA: Insufficient documentation

## 2014-09-07 DIAGNOSIS — E109 Type 1 diabetes mellitus without complications: Secondary | ICD-10-CM

## 2014-09-07 DIAGNOSIS — M129 Arthropathy, unspecified: Secondary | ICD-10-CM

## 2014-09-07 DIAGNOSIS — Z992 Dependence on renal dialysis: Secondary | ICD-10-CM

## 2014-09-07 DIAGNOSIS — F1290 Cannabis use, unspecified, uncomplicated: Secondary | ICD-10-CM

## 2014-09-07 DIAGNOSIS — Z9483 Pancreas transplant status: Secondary | ICD-10-CM | POA: Insufficient documentation

## 2014-09-07 DIAGNOSIS — Z940 Kidney transplant status: Secondary | ICD-10-CM | POA: Insufficient documentation

## 2014-09-07 DIAGNOSIS — F1210 Cannabis abuse, uncomplicated: Secondary | ICD-10-CM

## 2014-09-07 DIAGNOSIS — K219 Gastro-esophageal reflux disease without esophagitis: Secondary | ICD-10-CM | POA: Insufficient documentation

## 2014-09-07 DIAGNOSIS — E876 Hypokalemia: Secondary | ICD-10-CM | POA: Insufficient documentation

## 2014-09-07 DIAGNOSIS — R1115 Cyclical vomiting syndrome unrelated to migraine: Secondary | ICD-10-CM

## 2014-09-07 DIAGNOSIS — IMO0002 Reserved for concepts with insufficient information to code with codable children: Secondary | ICD-10-CM

## 2014-09-07 DIAGNOSIS — I120 Hypertensive chronic kidney disease with stage 5 chronic kidney disease or end stage renal disease: Secondary | ICD-10-CM | POA: Insufficient documentation

## 2014-09-07 DIAGNOSIS — E1149 Type 2 diabetes mellitus with other diabetic neurological complication: Secondary | ICD-10-CM | POA: Diagnosis not present

## 2014-09-07 LAB — CBC
HCT: 55.2 % — ABNORMAL HIGH (ref 39.0–52.0)
Hemoglobin: 17.9 g/dL — ABNORMAL HIGH (ref 13.0–17.0)
MCH: 30.9 pg (ref 26.0–34.0)
MCHC: 32.4 g/dL (ref 30.0–36.0)
MCV: 95.3 fL (ref 78.0–100.0)
Platelets: 160 10*3/uL (ref 150–400)
RBC: 5.79 MIL/uL (ref 4.22–5.81)
RDW: 14.2 % (ref 11.5–15.5)
WBC: 9.5 10*3/uL (ref 4.0–10.5)

## 2014-09-07 LAB — I-STAT CHEM 8, ED
BUN: 12 mg/dL (ref 6–23)
CALCIUM ION: 0.87 mmol/L — AB (ref 1.12–1.23)
CHLORIDE: 108 meq/L (ref 96–112)
Creatinine, Ser: 0.9 mg/dL (ref 0.50–1.35)
GLUCOSE: 97 mg/dL (ref 70–99)
HCT: 55 % — ABNORMAL HIGH (ref 39.0–52.0)
Hemoglobin: 18.7 g/dL — ABNORMAL HIGH (ref 13.0–17.0)
Potassium: 3.1 mEq/L — ABNORMAL LOW (ref 3.7–5.3)
Sodium: 142 mEq/L (ref 137–147)
TCO2: 22 mmol/L (ref 0–100)

## 2014-09-07 LAB — HEPATIC FUNCTION PANEL
ALT: 9 U/L (ref 0–53)
AST: 11 U/L (ref 0–37)
Albumin: 2 g/dL — ABNORMAL LOW (ref 3.5–5.2)
Alkaline Phosphatase: 84 U/L (ref 39–117)
Total Bilirubin: 0.4 mg/dL (ref 0.3–1.2)
Total Protein: 4.2 g/dL — ABNORMAL LOW (ref 6.0–8.3)

## 2014-09-07 LAB — LIPASE, BLOOD: Lipase: 30 U/L (ref 11–59)

## 2014-09-07 LAB — I-STAT TROPONIN, ED: Troponin i, poc: 0.01 ng/mL (ref 0.00–0.08)

## 2014-09-07 MED ORDER — HYDROCODONE-ACETAMINOPHEN 5-325 MG PO TABS: 1.0000 | ORAL_TABLET | ORAL | Status: DC | PRN

## 2014-09-07 MED ORDER — SODIUM CHLORIDE 0.9 % IV BOLUS (SEPSIS)
1000.0000 mL | Freq: Once | INTRAVENOUS | Status: AC
  Administered 2014-09-07: 1000 mL via INTRAVENOUS

## 2014-09-07 MED ORDER — HYDROMORPHONE HCL PF 1 MG/ML IJ SOLN
1.0000 mg | Freq: Once | INTRAMUSCULAR | Status: AC
  Administered 2014-09-07: 1 mg via INTRAVENOUS
  Filled 2014-09-07: qty 1

## 2014-09-07 MED ORDER — METOCLOPRAMIDE HCL 5 MG/ML IJ SOLN
10.0000 mg | Freq: Once | INTRAMUSCULAR | Status: AC
  Administered 2014-09-07: 10 mg via INTRAVENOUS
  Filled 2014-09-07: qty 2

## 2014-09-07 MED ORDER — ONDANSETRON HCL 4 MG/2ML IJ SOLN
4.0000 mg | Freq: Once | INTRAMUSCULAR | Status: AC
  Administered 2014-09-07: 4 mg via INTRAVENOUS
  Filled 2014-09-07: qty 2

## 2014-09-07 MED ORDER — ONDANSETRON 4 MG PO TBDP: 4.0000 mg | ORAL_TABLET | Freq: Three times a day (TID) | ORAL | Status: DC | PRN

## 2014-09-07 NOTE — ED Notes (Signed)
Pt sleeping, pre-occupied with sleeping, resting under covers, alert, NAD, calm, interactive, resps e/u, speaking in clear complete sentences, VSS, Dr. Henry Russel in to see/update pt.  2nd liter IVF bolus infusing.

## 2014-09-07 NOTE — ED Notes (Signed)
Per EMS, patient reports N/V since 2100 yesterday with generalized abdominal pain.

## 2014-09-07 NOTE — ED Notes (Signed)
Pt. Reports N/V starting last night at 2100 with generalized abdominal pain. Dialysis patient, last had dialysis x1 year ago.

## 2014-09-07 NOTE — Discharge Instructions (Signed)
Cannabis Use Disorder Cannabis use disorder is a mental disorder. It is not one-time or occasional use of cannabis, more commonly known as marijuana. Cannabis use disorder is the continued, nonmedical use of cannabis that interferes with normal life activities or causes health problems. People with cannabis use disorder get a feeling of extreme pleasure and relaxation from cannabis use. This "high" is very rewarding and causes people to use over and over.  The mind-altering ingredient in cannabis is know as THC. THC can also interfere with motor coordination, memory, judgment, and accurate sense of space and time. These effects can last for a few days after using cannabis. Regular heavy cannabis use can cause long-lasting problems with thinking and learning. In young people, these problems may be permanent. Cannabis sometimes causes severe anxiety, paranoia, or visual hallucinations. Man-made (synthetic) cannabis-like drugs, such as "spice" and "K2," cause the same effects as THC but are much stronger. Cannabis-like drugs can cause dangerously high blood pressure and heart rate.  Cannabis use disorder usually starts in the teenage years. It can trigger the development of schizophrenia. It is somewhat more common in men than women. People who have family members with the disorder or existing mental health issues such as depression and posttraumatic stress disorderare more likely to develop cannabis use disorder. People with cannabis use disorder are at higher risk for use of other drugs of abuse.  SIGNS AND SYMPTOMS Signs and symptoms of cannabis use disorder include:   Use of cannabis in larger amounts or over a longer period than intended.   Unsuccessful attempts to cut down or control cannabis use.   A lot of time spent obtaining, using, or recovering from the effects of cannabis.   A strong desire or urge to use cannabis (cravings).   Continued use of cannabis in spite of problems at work,  school, or home because of use.   Continued use of cannabis in spite of relationship problems because of use.  Giving up or cutting down on important life activities because of cannabis use.  Use of cannabis over and over even in situations when it is physically hazardous, such as when driving a car.   Continued use of cannabis in spite of a physical problem that is likely related to use. Physical problems can include:  Chronic cough.  Bronchitis.  Emphysema.  Throat and lung cancer.  Continued use of cannabis in spite of a mental problem that is likely related to use. Mental problems can include:  Psychosis.  Anxiety.  Difficulty sleeping.  Need to use more and more cannabis to get the same effect, or lessened effect over time with use of the same amount (tolerance).  Having withdrawal symptoms when cannabis use is stopped, or using cannabis to reduce or avoid withdrawal symptoms. Withdrawal symptoms include:  Irritability or anger.  Anxiety or restlessness.  Difficulty sleeping.  Loss of appetite or weight.  Aches and pains.  Shakiness.  Sweating.  Chills. DIAGNOSIS Cannabis use disorder is diagnosed by your health care provider. You may be asked questions about your cannabis use and how it affects your life. A physical exam may be done. A drug screen may be done. You may be referred to a mental health professional. The diagnosis of cannabis use disorder requires at least two symptoms within 12 months. The type of cannabis use disorder you have depends on the number of symptoms you have. The type may be:  Mild. Two or three signs and symptoms.   Moderate. Four or  five signs and symptoms.   Severe. Six or more signs and symptoms.  TREATMENT Treatment is usually provided by mental health professionals with training in substance use disorders. The following options are available:  Counseling or talk therapy. Talk therapy addresses the reasons you use  cannabis. It also addresses ways to keep you from using again. The goals of talk therapy include:  Identifying and avoiding triggers for use.  Learning how to handle cravings.  Replacing use with healthy activities.  Support groups. Support groups provide emotional support, advice, and guidance.  Medicine. Medicine is used to treat mental health issues that trigger cannabis use or that result from it. HOME CARE INSTRUCTIONS  Take medicines only as directed by your health care provider.  Check with your health care provider before starting any new medicines.  Keep all follow-up visits as directed by your health care provider. SEEK MEDICAL CARE IF:  You are not able to take your medicines as directed.  Your symptoms get worse. SEEK IMMEDIATE MEDICAL CARE IF: You have serious thoughts about hurting yourself or others. [place] on Drug Abuse: motorcyclefax.com  Substance Abuse and Mental Health Services Administration: ktimeonline.com Document Released: 12/09/2000 Document Revised: 04/28/2014 Document Reviewed: 12/25/2013 [facility] Patient Information 2015 [place], [place]. This information is not intended to replace advice given to you by your health care provider. Make sure you discuss any questions you have with your health care provider.  Cyclic Vomiting Syndrome Cyclic vomiting syndrome is a benign condition in which patients experience bouts or cycles of severe nausea and vomiting that last for hours or even days. The bouts of nausea and vomiting alternate with longer periods of no symptoms and generally good health. Cyclic vomiting syndrome occurs mostly in children, but can affect adults. CAUSES  [facility] has no known cause. Each episode is typically similar to the previous ones. The episodes tend to:   Start at about the same time of day.  Last the same length of time.  Present the same symptoms at the same level of intensity. Cyclic vomiting  syndrome can begin at any age in children and adults. Cyclic vomiting syndrome usually starts between the ages of 3 and 7 years. In adults, episodes tend to occur less often than they do in children, but they last longer. Furthermore, the events or situations that trigger episodes in adults cannot always be pinpointed as easily as they can in children. There are 4 phases of cyclic vomiting syndrome: 1. Prodrome. The prodrome phase signals that an episode of nausea and vomiting is about to begin. This phase can last from just a few minutes to several hours. This phase is often marked by belly (abdominal) pain. Sometimes taking medicine early in the prodrome phase can stop an episode in progress. However, sometimes there is no warning. A person may simply wake up in the middle of the night or early morning and begin vomiting. 2. Episode. The episode phase consists of:  Severe vomiting.  Nausea.  Gagging (retching). 3. Recovery. The recovery phase begins when the nausea and vomiting stop. Healthy color, appetite, and energy return. 4. Symptom-free interval. The symptom-free interval phase is the period between episodes when no symptoms are present. TRIGGERS Episodes can be triggered by an infection or event. Examples of triggers include:  Infections.  Colds, allergies, sinus problems, and the flu.  Eating certain foods such as chocolate or cheese.  Foods with monosodium glutamate (MSG) or preservatives.  Fast foods.  Pre-packaged foods.  Foods with low nutritional value (junk foods).  Overeating.  Eating just before going to bed.  Hot weather.  Dehydration.  Not enough sleep or poor sleep quality.  Physical exhaustion.  Menstruation.  Motion sickness.  Emotional stress (school or home difficulties).  Excitement or stress. SYMPTOMS  The main symptoms of cyclic vomiting syndrome are:  Severe vomiting.  Nausea.  Gagging (retching). Episodes usually begin at night or  the first thing in the morning. Episodes may include vomiting or retching up to 5 or 6 times an hour during the worst of the episode. Episodes usually last anywhere from 1 to 4 days. Episodes can last for up to 10 days. Other symptoms include:  Paleness.  Exhaustion.  Listlessness.  Abdominal pain.  Loose stools or diarrhea. Sometimes the nausea and vomiting are so severe that a person appears to be almost unconscious. Sensitivity to light, headache, fever, dizziness, may also accompany an episode. In addition, the vomiting may cause drooling and excessive thirst. Drinking water usually leads to more vomiting, though the water can dilute the acid in the vomit, making the episode a little less painful. Continuous vomiting can lead to dehydration, which means that the body has lost excessive water and salts. DIAGNOSIS  Cyclic vomiting syndrome is hard to diagnose because there are no clear tests to identify it. A caregiver must diagnose cyclic vomiting syndrome by looking at symptoms and medical history. A caregiver must exclude more common diseases or disorders that can also cause nausea and vomiting. Also, diagnosis takes time because caregivers need to identify a pattern or cycle to the vomiting. TREATMENT  Cyclic vomiting syndrome cannot be cured. Treatment varies, but people with cyclic vomiting syndrome should get plenty of rest and sleep and take medications that prevent, stop, or lessen the vomiting episodes and other symptoms. People whose episodes are frequent and long-lasting may be treated during the symptom-free intervals in an effort to prevent or ease future episodes. The symptom-free phase is a good time to eliminate anything known to trigger an episode. For example, if episodes are brought on by stress or excitement, this period is the time to find ways to reduce stress and stay calm. If sinus problems or allergies cause episodes, those conditions should be treated. The triggers listed  above should be avoided or prevented. Because of the similarities between migraine and cyclic vomiting syndrome, caregivers treat some people with severe cyclic vomiting syndrome with drugs that are also used for migraine headaches. The drugs are designed to:  Prevent episodes.  Reduce their frequency.  Lessen their severity. HOME CARE INSTRUCTIONS Once a vomiting episode begins, treatment is supportive. It helps to stay in bed and sleep in a dark, quiet room. Severe nausea and vomiting may require hospitalization and intravenous (IV) fluids to prevent dehydration. Relaxing medications (sedatives) may help if the nausea continues. Sometimes, during the prodrome phase, it is possible to stop an episode from happening altogether. Only take over-the-counter or prescription medicines for pain, discomfort or fever as directed by your caregiver. Do not give aspirin to children. During the recovery phase, drinking water and replacing lost electrolytes (salts in the blood) are very important. Electrolytes are salts that the body needs to function well and stay healthy. Symptoms during the recovery phase can vary. Some people find that their appetites return to normal immediately, while others need to begin by drinking clear liquids and then move slowly to solid food. RELATED COMPLICATIONS The severe vomiting that defines cyclic vomiting syndrome is a  risk factor for several complications:  Dehydration--Vomiting causes the body to lose water quickly.  Electrolyte imbalance--Vomiting also causes the body to lose the important salts it needs to keep working properly.  Peptic esophagitis--The tube that connects the mouth to the stomach (esophagus) becomes injured from the stomach acid that comes up with the vomit.  Hematemesis--The esophagus becomes irritated and bleeds, so blood mixes with the vomit.  Mallory-Weiss tear--The lower end of the esophagus may tear open or the stomach may bruise from vomiting  or retching.  Tooth decay--The acid in the vomit can hurt the teeth by corroding the tooth enamel. SEEK MEDICAL CARE IF: You have questions or problems. Document Released: 02/20/2002 Document Revised: 03/05/2012 Document Reviewed: 03/21/2011 [facility] Patient Information 2015 [place], [place]. This information is not intended to replace advice given to you by your health care provider. Make sure you discuss any questions you have with your health care provider.

## 2014-09-07 NOTE — ED Provider Notes (Signed)
CSN: [id number]     Arrival date & time 09/07/14  2006 History   First MD Initiated Contact with Patient 09/07/14 2152     Chief Complaint  Patient presents with  . Emesis    Patient is a 38 y.o. male presenting with vomiting. The history is provided by the patient.  Emesis Severity:  Severe Duration:  1 day Timing:  Constant Number of daily episodes:  10 Quality:  Stomach contents Able to tolerate:  Liquids Progression:  Unchanged Chronicity:  Recurrent (happened once before) Relieved by:  Nothing Worsened by:  Nothing tried Ineffective treatments:  None tried Associated symptoms: abdominal pain, chills and cough   Associated symptoms: no diarrhea, no fever, no headaches and no URI   Abdominal pain:    Location:  Generalized   Quality:  Unable to specify   Progression:  Unchanged Cough:    Cough characteristics:  Dry   Sputum characteristics:  Clear Risk factors: diabetes and prior abdominal surgery   Risk factors: no alcohol use, no sick contacts, no suspect food intake and no travel to endemic areas     Kidney pancreas transplant.  DM, gastroparesis.  Uses marijuana frequently  Past Medical History  Diagnosis Date  . Hypertension   . GERD (gastroesophageal reflux disease)   . Gastropathy   . Chronic pain   . Depression   . Edema   . Headache(784.0)   . 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 Dialysis patient 04/18/12    "[facility]; [place], [place], [place]"  . Blood transfusion   . Arthritis     "I think I do; just in my fingers & my hands"  . Type I diabetes mellitus     "juvenile"  . Gastroparesis   . ESRD (end stage renal disease) on dialysis    Past Surgical History  Procedure Laterality Date  . Below knee leg amputation  "it's been awhile"    bilaterally  . Cataract extraction  ~ 2011    right  . Av fistula placement  08/2011    left upper arm  . Combined kidney-pancreas transplant     Family History  Problem Relation Age of Onset   . Diabetes Other   . Hypertension Other    History  Substance Use Topics  . Smoking status: Never Smoker   . Smokeless tobacco: Never Used  . Alcohol Use: No    Review of Systems  Constitutional: Positive for chills and appetite change. Negative for fatigue.  Respiratory: Negative for cough, shortness of breath and wheezing.   Cardiovascular: Positive for chest pain (burning when he vomits).  Gastrointestinal: Positive for vomiting and abdominal pain. Negative for nausea, diarrhea, blood in stool and abdominal distention.  Genitourinary: Negative for dysuria.  Musculoskeletal: Negative for back pain.  Skin: Negative for rash.  Neurological: Negative for syncope, weakness, light-headedness and headaches.  All other systems reviewed and are negative.   Allergies  Review of patient's allergies indicates no known allergies.  Home Medications   Prior to Admission medications   Medication Sig Start Date End Date Taking? Authorizing Provider  aspirin EC 81 MG tablet Take 81 mg by mouth daily.    Historical Provider, MD  dicyclomine (BENTYL) 20 MG tablet Take 1 tablet (20 mg total) by mouth every 6 (six) hours as needed for spasms (for abdominal cramping). 06/06/14   Kalman Drape, MD  doxycycline (VIBRA-TABS) 100 MG tablet Take 1 tablet (100 mg total) by mouth 2 (two)  times daily. 07/29/14   Nolon Rod, DO  HYDROcodone-acetaminophen (NORCO) 10-325 MG per tablet Take 1 tablet by mouth every 8 (eight) hours as needed. 05/30/14   Gerda Diss, DO  levofloxacin (LEVAQUIN) 750 MG tablet Take 1 tablet (750 mg total) by mouth daily. 07/29/14   Nolon Rod, DO  mycophenolate (MYFORTIC) 180 MG EC tablet Take 720 mg by mouth 2 (two) times daily.  07/06/13   Historical Provider, MD  omeprazole (PRILOSEC) 20 MG capsule Take 1 capsule (20 mg total) by mouth daily as needed (heartburn). 06/06/14   Kalman Drape, MD  ondansetron (ZOFRAN ODT) 8 MG disintegrating tablet Take 1 tablet (8 mg total) by mouth  every 8 (eight) hours as needed for nausea or vomiting. 06/06/14   Kalman Drape, MD  predniSONE (DELTASONE) 5 MG tablet Take 5 mg by mouth daily.  06/20/13   Historical Provider, MD  promethazine (PHENERGAN) 12.5 MG tablet Take 1 tablet (12.5 mg total) by mouth every 6 (six) hours as needed for nausea or vomiting. 02/25/14   [name], MD  promethazine (PHENERGAN) 25 MG suppository Place 1 suppository (25 mg total) rectally every 6 (six) hours as needed for nausea or vomiting. 04/23/14   Tatyana A Kirichenko, PA-C  sulfamethoxazole-trimethoprim (BACTRIM,SEPTRA) 400-80 MG per tablet Take 1 tablet by mouth every Monday, Wednesday, and Friday.     Historical Provider, MD  tacrolimus (PROGRAF) 1 MG capsule Take 1-2 mg by mouth 2 (two) times daily. Take 2 capsules by mouth in the AM and 1 capsule in the evening 07/16/13   Historical Provider, MD   BP 191/122  Pulse 110  Temp(Src) 98.1 F (36.7 C) (Oral)  Resp 23  SpO2 100% Physical Exam  Nursing note and vitals reviewed. Constitutional: He is oriented to person, place, and time. He appears well-developed and well-nourished.  vomiting  HENT:  Head: Normocephalic and atraumatic.  Nose: Nose normal.  MMM  Eyes: Conjunctivae and EOM are normal. Pupils are equal, round, and reactive to light.  Neck: Normal range of motion. Neck supple. No tracheal deviation present.  Cardiovascular: Regular rhythm and normal heart sounds.   No murmur heard. Tachy 110  Pulmonary/Chest: Effort normal and breath sounds normal. No respiratory distress. He has no rales.  Abdominal: Soft. Bowel sounds are normal. He exhibits no distension and no mass. There is tenderness. There is no rebound and no guarding.  Generalized TTP  Musculoskeletal: Normal range of motion. He exhibits no edema.  BKA bilaterally  Neurological: He is alert and oriented to person, place, and time.  Skin: Skin is warm and dry. No rash noted.  Psychiatric: He has a normal mood and affect.     ED Course  Procedures (including critical care time) Labs Review Labs Reviewed  HEPATIC FUNCTION PANEL - Abnormal; Notable for the following:    Total Protein 4.2 (*)    Albumin 2.0 (*)    All other components within normal limits  CBC - Abnormal; Notable for the following:    Hemoglobin 17.9 (*)    HCT 55.2 (*)    All other components within normal limits  I-STAT CHEM 8, ED - Abnormal; Notable for the following:    Sodium 135 (*)    Potassium 7.5 (*)    Glucose, Bld 115 (*)    Calcium, Ion 1.03 (*)    Hemoglobin 23.8 (*)    HCT 70.0 (*)    All other components within normal limits  I-STAT CHEM  8, ED - Abnormal; Notable for the following:    Potassium 3.1 (*)    Calcium, Ion 0.87 (*)    Hemoglobin 18.7 (*)    HCT 55.0 (*)    All other components within normal limits  LIPASE, BLOOD  TACROLIMUS LEVEL  I-STAT TROPOININ, ED  I-STAT TROPOININ, ED    Imaging Review Dg Chest Portable 1 View  09/07/2014   CLINICAL DATA:  EMESIS  EXAM: PORTABLE CHEST - 1 VIEW  COMPARISON:  Prior radiograph from 04/23/2014  FINDINGS: The cardiac and mediastinal silhouettes are stable in size and contour, and remain within normal limits.  The lungs are normally inflated. No airspace consolidation, pleural effusion, or pulmonary edema is identified. There is no pneumothorax. Nodular shadows overlying the bilateral lung bases are favored to reflect nipple shadows.  No acute osseous abnormality identified.  IMPRESSION: No active cardiopulmonary disease.   Electronically Signed   By: Jeannine Boga M.D.   On: 09/07/2014 23:15     EKG Interpretation   Date/Time:  Sunday September 07 2014 20:19:58 EDT Ventricular Rate:  107 PR Interval:  147 QRS Duration: 79 QT Interval:  340 QTC Calculation: 454 R Axis:   -77 Text Interpretation:  Age not entered, assumed to be  38 years old for  purpose of ECG interpretation Sinus tachycardia Biatrial enlargement LAD,  consider LAFB or inferior infarct  Anterior infarct, old Baseline wander in  lead(s) V5 Abnormal ekg Confirmed by BEATON  MD, ROBERT ([id number]) on  09/07/2014 11:31:24 PM      MDM   Final diagnoses:  Intractable vomiting with nausea, vomiting of unspecified type  Hypokalemia  Marijuana use   Presents with intractable emesis.   Hemoglobin elevated, may be hemoconcentrated.  Afebrile. Abdomen with generalized TTP, doubt transplant infection.  No signs of peritonitis or systemic toxicity to suggest surgical emergency.  Complains of chest pain after vomiting, no free air on CXR, doubt rupture. Pt denies bloody/dark emesis. Pancreas transplant, lipase wnl.   Marijuana use complicating cyclical vomiting.   11:16 PM feeling better. No vomiting. BP 140/70. Abd soft, NDNT. Does not appear clinically dehydrated. Tachycardia improved  Pt agrees with discharge home. D/c with antiemetics. Will see PCP in 2 days.  Counseled on drug use cessation.   Tammy Sours, MD 09/07/14 [phone]

## 2014-09-08 ENCOUNTER — Inpatient Hospital Stay (HOSPITAL_COMMUNITY)
Admission: EM | Admit: 2014-09-08 | Discharge: 2014-09-10 | DRG: 074 | Disposition: A | Discharge status: Home / Self Care | Payer: Medicare Other | Attending: Family Medicine | Admitting: Family Medicine

## 2014-09-08 DIAGNOSIS — F329 Major depressive disorder, single episode, unspecified: Secondary | ICD-10-CM | POA: Diagnosis present

## 2014-09-08 DIAGNOSIS — N186 End stage renal disease: Secondary | ICD-10-CM

## 2014-09-08 DIAGNOSIS — I10 Essential (primary) hypertension: Secondary | ICD-10-CM

## 2014-09-08 DIAGNOSIS — R109 Unspecified abdominal pain: Secondary | ICD-10-CM | POA: Diagnosis present

## 2014-09-08 DIAGNOSIS — Z9849 Cataract extraction status, unspecified eye: Secondary | ICD-10-CM

## 2014-09-08 DIAGNOSIS — Z9483 Pancreas transplant status: Secondary | ICD-10-CM

## 2014-09-08 DIAGNOSIS — E1149 Type 2 diabetes mellitus with other diabetic neurological complication: Principal | ICD-10-CM | POA: Diagnosis present

## 2014-09-08 DIAGNOSIS — F1210 Cannabis abuse, uncomplicated: Secondary | ICD-10-CM | POA: Diagnosis present

## 2014-09-08 DIAGNOSIS — R112 Nausea with vomiting, unspecified: Secondary | ICD-10-CM | POA: Diagnosis present

## 2014-09-08 DIAGNOSIS — Z79899 Other long term (current) drug therapy: Secondary | ICD-10-CM

## 2014-09-08 DIAGNOSIS — Z7982 Long term (current) use of aspirin: Secondary | ICD-10-CM

## 2014-09-08 DIAGNOSIS — E785 Hyperlipidemia, unspecified: Secondary | ICD-10-CM | POA: Diagnosis present

## 2014-09-08 DIAGNOSIS — IMO0002 Reserved for concepts with insufficient information to code with codable children: Secondary | ICD-10-CM

## 2014-09-08 DIAGNOSIS — R1110 Vomiting, unspecified: Secondary | ICD-10-CM

## 2014-09-08 DIAGNOSIS — K3184 Gastroparesis: Secondary | ICD-10-CM

## 2014-09-08 DIAGNOSIS — S88119A Complete traumatic amputation at level between knee and ankle, unspecified lower leg, initial encounter: Secondary | ICD-10-CM

## 2014-09-08 DIAGNOSIS — E1165 Type 2 diabetes mellitus with hyperglycemia: Secondary | ICD-10-CM

## 2014-09-08 DIAGNOSIS — G8929 Other chronic pain: Secondary | ICD-10-CM | POA: Diagnosis present

## 2014-09-08 DIAGNOSIS — K219 Gastro-esophageal reflux disease without esophagitis: Secondary | ICD-10-CM | POA: Diagnosis present

## 2014-09-08 DIAGNOSIS — E860 Dehydration: Secondary | ICD-10-CM | POA: Diagnosis present

## 2014-09-08 DIAGNOSIS — F3289 Other specified depressive episodes: Secondary | ICD-10-CM | POA: Diagnosis present

## 2014-09-08 DIAGNOSIS — E1143 Type 2 diabetes mellitus with diabetic autonomic (poly)neuropathy: Secondary | ICD-10-CM

## 2014-09-08 DIAGNOSIS — Z23 Encounter for immunization: Secondary | ICD-10-CM

## 2014-09-08 DIAGNOSIS — Z940 Kidney transplant status: Secondary | ICD-10-CM

## 2014-09-08 LAB — I-STAT CHEM 8, ED
BUN: 21 mg/dL (ref 6–23)
CALCIUM ION: 1.03 mmol/L — AB (ref 1.12–1.23)
Chloride: 101 mEq/L (ref 96–112)
Creatinine, Ser: 1.2 mg/dL (ref 0.50–1.35)
GLUCOSE: 115 mg/dL — AB (ref 70–99)
HEMATOCRIT: 70 % — AB (ref 39.0–52.0)
Hemoglobin: 23.8 g/dL (ref 13.0–17.0)
Potassium: 7.5 mEq/L (ref 3.7–5.3)
Sodium: 135 mEq/L — ABNORMAL LOW (ref 137–147)
TCO2: 31 mmol/L (ref 0–100)

## 2014-09-08 NOTE — ED Notes (Signed)
Pt sleeping, no changes, no emesis.

## 2014-09-08 NOTE — ED Notes (Signed)
Pt calling for ride, alert, NAD, calm, (denies: questions, needs, sx or concerns unmet).

## 2014-09-09 ENCOUNTER — Emergency Department (HOSPITAL_COMMUNITY): Payer: Medicare Other

## 2014-09-09 ENCOUNTER — Encounter (HOSPITAL_COMMUNITY): Payer: Self-pay | Admitting: Emergency Medicine

## 2014-09-09 DIAGNOSIS — Z940 Kidney transplant status: Secondary | ICD-10-CM

## 2014-09-09 DIAGNOSIS — N186 End stage renal disease: Secondary | ICD-10-CM

## 2014-09-09 DIAGNOSIS — E1149 Type 2 diabetes mellitus with other diabetic neurological complication: Principal | ICD-10-CM

## 2014-09-09 DIAGNOSIS — IMO0001 Reserved for inherently not codable concepts without codable children: Secondary | ICD-10-CM

## 2014-09-09 DIAGNOSIS — K3184 Gastroparesis: Secondary | ICD-10-CM

## 2014-09-09 DIAGNOSIS — E860 Dehydration: Secondary | ICD-10-CM

## 2014-09-09 DIAGNOSIS — E1165 Type 2 diabetes mellitus with hyperglycemia: Secondary | ICD-10-CM

## 2014-09-09 DIAGNOSIS — R1115 Cyclical vomiting syndrome unrelated to migraine: Secondary | ICD-10-CM | POA: Diagnosis not present

## 2014-09-09 DIAGNOSIS — R112 Nausea with vomiting, unspecified: Secondary | ICD-10-CM | POA: Diagnosis present

## 2014-09-09 DIAGNOSIS — Z9483 Pancreas transplant status: Secondary | ICD-10-CM

## 2014-09-09 DIAGNOSIS — I10 Essential (primary) hypertension: Secondary | ICD-10-CM

## 2014-09-09 LAB — LIPID PANEL
CHOL/HDL RATIO: 2.6 ratio
CHOLESTEROL: 132 mg/dL (ref 0–200)
HDL: 51 mg/dL (ref >39)
LDL CALC: 65 mg/dL (ref 0–99)
TRIGLYCERIDES: 80 mg/dL (ref <150)
VLDL: 16 mg/dL (ref 0–40)

## 2014-09-09 LAB — COMPREHENSIVE METABOLIC PANEL
ALBUMIN: 3 g/dL — AB (ref 3.5–5.2)
ALT: 14 U/L (ref 0–53)
ALT: 13 U/L (ref 0–53)
AST: 15 U/L (ref 0–37)
AST: 11 U/L (ref 0–37)
Albumin: 3.1 g/dL — ABNORMAL LOW (ref 3.5–5.2)
Alkaline Phosphatase: 126 U/L — ABNORMAL HIGH (ref 39–117)
Alkaline Phosphatase: 120 U/L — ABNORMAL HIGH (ref 39–117)
Anion gap: 12 (ref 5–15)
Anion gap: 12 (ref 5–15)
BUN: 11 mg/dL (ref 6–23)
BUN: 10 mg/dL (ref 6–23)
CALCIUM: 8.8 mg/dL (ref 8.4–10.5)
CO2: 23 mEq/L (ref 19–32)
CO2: 24 meq/L (ref 19–32)
CREATININE: 1.07 mg/dL (ref 0.50–1.35)
Calcium: 8.8 mg/dL (ref 8.4–10.5)
Chloride: 100 mEq/L (ref 96–112)
Chloride: 101 mEq/L (ref 96–112)
Creatinine, Ser: 1.09 mg/dL (ref 0.50–1.35)
GFR calc Af Amer: >90 mL/min (ref >90)
GFR calc Af Amer: >90 mL/min (ref >90)
GFR calc non Af Amer: 84 mL/min — ABNORMAL LOW (ref >90)
GFR calc non Af Amer: 86 mL/min — ABNORMAL LOW (ref >90)
Glucose, Bld: 122 mg/dL — ABNORMAL HIGH (ref 70–99)
Glucose, Bld: 103 mg/dL — ABNORMAL HIGH (ref 70–99)
Potassium: 4.1 mEq/L (ref 3.7–5.3)
Potassium: 4 mEq/L (ref 3.7–5.3)
SODIUM: 135 meq/L — AB (ref 137–147)
SODIUM: 137 meq/L (ref 137–147)
TOTAL PROTEIN: 6.7 g/dL (ref 6.0–8.3)
Total Bilirubin: 0.5 mg/dL (ref 0.3–1.2)
Total Bilirubin: 0.6 mg/dL (ref 0.3–1.2)
Total Protein: 6.3 g/dL (ref 6.0–8.3)

## 2014-09-09 LAB — RAPID URINE DRUG SCREEN, HOSP PERFORMED
Amphetamines: NOT DETECTED
Barbiturates: NOT DETECTED
Benzodiazepines: NOT DETECTED
Cocaine: NOT DETECTED
OPIATES: POSITIVE — AB
TETRAHYDROCANNABINOL: POSITIVE — AB

## 2014-09-09 LAB — CBC
HEMATOCRIT: 52.2 % — AB (ref 39.0–52.0)
HEMOGLOBIN: 17.5 g/dL — AB (ref 13.0–17.0)
MCH: 31.4 pg (ref 26.0–34.0)
MCHC: 33.5 g/dL (ref 30.0–36.0)
MCV: 93.5 fL (ref 78.0–100.0)
Platelets: 168 10*3/uL (ref 150–400)
RBC: 5.58 MIL/uL (ref 4.22–5.81)
RDW: 13.8 % (ref 11.5–15.5)
WBC: 7.7 10*3/uL (ref 4.0–10.5)

## 2014-09-09 LAB — URINALYSIS, ROUTINE W REFLEX MICROSCOPIC
Bilirubin Urine: NEGATIVE
GLUCOSE, UA: NEGATIVE mg/dL
Ketones, ur: 15 mg/dL — AB
Leukocytes, UA: NEGATIVE
Nitrite: NEGATIVE
Protein, ur: 30 mg/dL — AB
SPECIFIC GRAVITY, URINE: 1.014 (ref 1.005–1.030)
UROBILINOGEN UA: 1 mg/dL (ref 0.0–1.0)
pH: 6.5 (ref 5.0–8.0)

## 2014-09-09 LAB — CBC WITH DIFFERENTIAL/PLATELET
BASOS ABS: 0 10*3/uL (ref 0.0–0.1)
BASOS PCT: 0 % (ref 0–1)
Eosinophils Absolute: 0 10*3/uL (ref 0.0–0.7)
Eosinophils Relative: 0 % (ref 0–5)
HCT: 60.5 % — ABNORMAL HIGH (ref 39.0–52.0)
Hemoglobin: 20.5 g/dL — ABNORMAL HIGH (ref 13.0–17.0)
Lymphocytes Relative: 4 % — ABNORMAL LOW (ref 12–46)
Lymphs Abs: 0.3 10*3/uL — ABNORMAL LOW (ref 0.7–4.0)
MCH: 31.8 pg (ref 26.0–34.0)
MCHC: 33.9 g/dL (ref 30.0–36.0)
MCV: 93.9 fL (ref 78.0–100.0)
Monocytes Absolute: 0.7 10*3/uL (ref 0.1–1.0)
Monocytes Relative: 10 % (ref 3–12)
NEUTROS ABS: 6.4 10*3/uL (ref 1.7–7.7)
NEUTROS PCT: 86 % — AB (ref 43–77)
Platelets: 247 10*3/uL (ref 150–400)
RBC: 6.44 MIL/uL — ABNORMAL HIGH (ref 4.22–5.81)
RDW: 14.2 % (ref 11.5–15.5)
WBC: 7.4 10*3/uL (ref 4.0–10.5)

## 2014-09-09 LAB — HEMOGLOBIN A1C
Hgb A1c MFr Bld: 5.6 % (ref <5.7)
Mean Plasma Glucose: 114 mg/dL (ref <117)

## 2014-09-09 LAB — URINE MICROSCOPIC-ADD ON

## 2014-09-09 LAB — MRSA PCR SCREENING: MRSA by PCR: NEGATIVE

## 2014-09-09 LAB — I-STAT TROPONIN, ED: Troponin i, poc: 0 ng/mL (ref 0.00–0.08)

## 2014-09-09 LAB — LIPASE, BLOOD: LIPASE: 97 U/L — AB (ref 11–59)

## 2014-09-09 MED ORDER — MORPHINE SULFATE 2 MG/ML IJ SOLN
2.0000 mg | INTRAMUSCULAR | Status: DC | PRN
  Administered 2014-09-09 (×3): 2 mg via INTRAVENOUS
  Filled 2014-09-09 (×3): qty 1

## 2014-09-09 MED ORDER — HYDRALAZINE HCL 20 MG/ML IJ SOLN
5.0000 mg | Freq: Four times a day (QID) | INTRAMUSCULAR | Status: DC | PRN
  Administered 2014-09-09 – 2014-09-10 (×4): 5 mg via INTRAVENOUS
  Filled 2014-09-09 (×4): qty 1

## 2014-09-09 MED ORDER — ACETAMINOPHEN 650 MG RE SUPP: 650.0000 mg | Freq: Four times a day (QID) | RECTAL | Status: DC | PRN

## 2014-09-09 MED ORDER — TACROLIMUS 1 MG PO CAPS
3.0000 mg | ORAL_CAPSULE | Freq: Two times a day (BID) | ORAL | Status: DC
  Administered 2014-09-09 – 2014-09-10 (×3): 3 mg via ORAL
  Filled 2014-09-09 (×5): qty 3

## 2014-09-09 MED ORDER — HEPARIN SODIUM (PORCINE) 5000 UNIT/ML IJ SOLN
5000.0000 [IU] | Freq: Three times a day (TID) | INTRAMUSCULAR | Status: DC
  Administered 2014-09-09 (×3): 5000 [IU] via SUBCUTANEOUS
  Filled 2014-09-09 (×7): qty 1

## 2014-09-09 MED ORDER — PREDNISONE 5 MG PO TABS
5.0000 mg | ORAL_TABLET | Freq: Every day | ORAL | Status: DC
  Administered 2014-09-09 – 2014-09-10 (×2): 5 mg via ORAL
  Filled 2014-09-09 (×2): qty 1

## 2014-09-09 MED ORDER — ASPIRIN EC 81 MG PO TBEC
81.0000 mg | DELAYED_RELEASE_TABLET | Freq: Every day | ORAL | Status: DC
  Administered 2014-09-09 – 2014-09-10 (×2): 81 mg via ORAL
  Filled 2014-09-09 (×2): qty 1

## 2014-09-09 MED ORDER — ONDANSETRON 8 MG/NS 50 ML IVPB
8.0000 mg | Freq: Four times a day (QID) | INTRAVENOUS | Status: DC | PRN
  Administered 2014-09-09: 8 mg via INTRAVENOUS
  Filled 2014-09-09: qty 8

## 2014-09-09 MED ORDER — ACETAMINOPHEN 325 MG PO TABS: 650.0000 mg | ORAL_TABLET | Freq: Four times a day (QID) | ORAL | Status: DC | PRN

## 2014-09-09 MED ORDER — CHLORHEXIDINE GLUCONATE 0.12 % MT SOLN
15.0000 mL | Freq: Two times a day (BID) | OROMUCOSAL | Status: DC
  Administered 2014-09-10: 15 mL via OROMUCOSAL
  Filled 2014-09-09 (×4): qty 15

## 2014-09-09 MED ORDER — ONDANSETRON HCL 4 MG/2ML IJ SOLN
4.0000 mg | Freq: Once | INTRAMUSCULAR | Status: AC
  Administered 2014-09-09: 4 mg via INTRAVENOUS
  Filled 2014-09-09: qty 2

## 2014-09-09 MED ORDER — CETYLPYRIDINIUM CHLORIDE 0.05 % MT LIQD: 7.0000 mL | Freq: Two times a day (BID) | OROMUCOSAL | Status: DC

## 2014-09-09 MED ORDER — SODIUM CHLORIDE 0.9 % IV BOLUS (SEPSIS)
1000.0000 mL | Freq: Once | INTRAVENOUS | Status: AC
  Administered 2014-09-09: 1000 mL via INTRAVENOUS

## 2014-09-09 MED ORDER — PROMETHAZINE HCL 25 MG/ML IJ SOLN
25.0000 mg | Freq: Once | INTRAMUSCULAR | Status: AC
  Administered 2014-09-09: 25 mg via INTRAVENOUS
  Filled 2014-09-09: qty 1

## 2014-09-09 MED ORDER — MYCOPHENOLATE SODIUM 180 MG PO TBEC
720.0000 mg | DELAYED_RELEASE_TABLET | Freq: Two times a day (BID) | ORAL | Status: DC
  Administered 2014-09-09 – 2014-09-10 (×3): 720 mg via ORAL
  Filled 2014-09-09 (×5): qty 4

## 2014-09-09 MED ORDER — METOCLOPRAMIDE HCL 5 MG/ML IJ SOLN
10.0000 mg | Freq: Three times a day (TID) | INTRAMUSCULAR | Status: DC
  Administered 2014-09-09 – 2014-09-10 (×5): 10 mg via INTRAVENOUS
  Filled 2014-09-09 (×8): qty 2

## 2014-09-09 MED ORDER — ONDANSETRON HCL 4 MG PO TABS: 8.0000 mg | ORAL_TABLET | Freq: Four times a day (QID) | ORAL | Status: DC | PRN

## 2014-09-09 MED ORDER — INFLUENZA VAC SPLIT QUAD 0.5 ML IM SUSY
0.5000 mL | PREFILLED_SYRINGE | INTRAMUSCULAR | Status: AC
  Administered 2014-09-10: 0.5 mL via INTRAMUSCULAR
  Filled 2014-09-09: qty 0.5

## 2014-09-09 MED ORDER — PANTOPRAZOLE SODIUM 40 MG IV SOLR
40.0000 mg | INTRAVENOUS | Status: DC
  Administered 2014-09-09 – 2014-09-10 (×2): 40 mg via INTRAVENOUS
  Filled 2014-09-09 (×3): qty 40

## 2014-09-09 MED ORDER — SODIUM CHLORIDE 0.9 % IV SOLN
INTRAVENOUS | Status: DC
  Administered 2014-09-09 (×2): via INTRAVENOUS
  Administered 2014-09-09: 1000 mL via INTRAVENOUS
  Administered 2014-09-09: 08:00:00 via INTRAVENOUS

## 2014-09-09 MED ORDER — MORPHINE SULFATE 4 MG/ML IJ SOLN
4.0000 mg | Freq: Once | INTRAMUSCULAR | Status: AC
  Administered 2014-09-09: 4 mg via INTRAVENOUS
  Filled 2014-09-09: qty 1

## 2014-09-09 NOTE — ED Notes (Signed)
IV Team at bedside starting PIV.

## 2014-09-09 NOTE — Progress Notes (Signed)
Utilization Review Completed.[name] T9/15/2015  

## 2014-09-09 NOTE — H&P (Signed)
[facility] Admission History and Physical Service Pager: [phone]  Patient name: [name] Medical record number: [id number] Date of birth: 12-22-1976 Age: 38 y.o. Gender: male  Primary Care Provider: Kennith Maes, DO Consultants: None Code Status: Full Code  Chief Complaint: Nausea, vomiting, abdominal pain  Assessment and Plan: [name] is a 38 y.o. male presenting with intractable nausea, vomiting, and abdominal pain. PMH is  significant for DM, Hx of ESRD (patient now s/p pancreatic and kidney transplant 05/2013), uncontrolled HTN, GERD, HLD, Gastroparesis, and depression.  Intractable nausea, vomiting, and abdominal pain - Secondary to gastroparesis vs marijuana use.  There also could be an element of pancreatitis given Lipase of 97 (less likely as this is only mildy elevated).   Of note, patient has been admitted for this in the past. - Admit to Med surg - NPO; will advance diet as tolerated - IV fluids - NS @ 100 mL/hr (s/p 1 NS bolus in ED) - IV Protonix - IV Reglan - Zofran PRN - Will monitor closely for improvement  DM, Hx of ESRD (s/p pancreatic and kidney transplant 05/2013) - Continuing home immunosuppressant medications - prednisone 5 mg daily, mycophenolate 720 mg twice a day, Prograf 3 mg twice a day.  - Obtaining A1c as a glucose was elevated at 122 on CMP  HTN - Uncontrolled. Patient on no medications for this at home.  - PRN hydralazine  - Will likely need to add oral agent during admission. - Will monitor closely  HLD - Documented in patients problem list  - No current treatment at this time. Repeating lipid panel.  FEN/GI: NPO; IV fluids - NS @ 100 mL/hr Prophylaxis: Heparin SQ  Disposition: Pending clinical improvement.  History of Present Illness:  [name] is a 38 y.o. male with a complicated past medical history including DM, Hx of ESRD (patient now s/p pancreatic and kidney transplant  05/2013), uncontrolled HTN, GERD, HLD, Gastroparesis, and depression who presents with intractable nausea/vomiting with associated abdominal pain.  Patient reports a five-day history of nausea, vomiting, and abdominal pain.  Emesis is nonbloody and nonbilious.  He reports associated bowel pain which is diffuse.  Patient also noted some associated diarrhea. Tactile fever, chills, and decreased PO intake also reported.    Patient was seen in the ED on 9/13 with similar complaints.  At that time laboratory studies were unremarkable other than hemoconcentration.  Patient was treated with Zofran, pain medication, and IV fluids with improvement in symptoms. He was subsequently discharged home.  Patient returned today with continued complaints as above. In the ED, laboratory workup revealed elevated hemoglobin and hematocrit 20.5/60.5, elevated lipase of 97, and a UDS positive for marijuana. Family medicine asked to admit given persistent symptoms and lack of improvement  Review Of Systems: Per HPI. Otherwise 12 point review of systems was performed and was unremarkable.  Patient Active Problem List   Diagnosis Date Noted  . Intractable nausea and vomiting 09/09/2014  . Stage II pressure ulcer 07/29/2014  . Weakness of left upper extremity 07/03/2014  . Impaired mobility and activities of daily living 07/03/2014  . Sternoclavicular joint pain 03/04/2014  . History of transplantation, renal 07/19/2013  . History of pancreas transplant 07/19/2013  . BKA stump complication [id number]  . S/P bilateral BKA (below knee amputation) 08/17/2012  . Diabetic gastroparesis 07/20/2011  . Metabolic bone disease 99991111  . ESRD (end stage renal disease) 11/17/2009  . GERD 03/14/2007  . DM (diabetes mellitus),  type 2, uncontrolled 02/22/2007  . HYPERLIPIDEMIA 02/22/2007  . DEPRESSION, MAJOR, RECURRENT 02/22/2007  . POST TRAUMATIC STRESS DISORDER 02/22/2007  . HYPERTENSION, BENIGN SYSTEMIC 02/22/2007  .  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 Hypertension   . GERD (gastroesophageal reflux disease)   . Gastropathy   . Chronic pain   . Depression   . Edema   . Headache(784.0)   . 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 Dialysis patient 04/18/12    "[facility]; [place], [place], [place]"  . Blood transfusion   . Arthritis     "I think I do; just in my fingers & my hands"  . Type I diabetes mellitus     "juvenile"  . Gastroparesis   . ESRD (end stage renal disease) on dialysis    Past Surgical History: Past Surgical History  Procedure Laterality Date  . Below knee leg amputation  "it's been awhile"    bilaterally  . Cataract extraction  ~ 2011    right  . Av fistula placement  08/2011    left upper arm  . Combined kidney-pancreas transplant     Social History: History  Substance Use Topics  . Smoking status: Never Smoker   . Smokeless tobacco: Never Used  . Alcohol Use: No   Family History: Family History  Problem Relation Age of Onset  . Diabetes Other   . Hypertension Other    Allergies and Medications: No Known Allergies No current facility-administered medications on file prior to encounter.   Current Outpatient Prescriptions on File Prior to Encounter  Medication Sig Dispense Refill  . aspirin EC 81 MG tablet Take 81 mg by mouth daily.      [name] HYDROcodone-acetaminophen (NORCO/VICODIN) 5-325 MG per tablet Take 1 tablet by mouth every 4 (four) hours as needed for moderate pain or severe pain.  6 tablet  0  . mycophenolate (MYFORTIC) 180 MG EC tablet Take 720 mg by mouth 2 (two) times daily.       [name] omeprazole (PRILOSEC) 20 MG capsule Take 1 capsule (20 mg total) by mouth daily as needed (heartburn).  30 capsule  0  . ondansetron (ZOFRAN ODT) 4 MG disintegrating tablet Take 1 tablet (4 mg total) by mouth every 8 (eight) hours as needed for nausea or vomiting.  20 tablet  0  . predniSONE (DELTASONE)  5 MG tablet Take 5 mg by mouth daily.       [name] sulfamethoxazole-trimethoprim (BACTRIM,SEPTRA) 400-80 MG per tablet Take 1 tablet by mouth every Monday, Wednesday, and Friday.       . tacrolimus (PROGRAF) 1 MG capsule Take 3 mg by mouth 2 (two) times daily.         Objective: BP 181/102  Pulse 95  Temp(Src) 98.8 F (37.1 C) (Oral)  Resp 20  SpO2 98% Exam: General: Thin African American male, lying in bed, no acute distress. HEENT: Dry mucous membranes noted. No scleral icterus.  Upper and lower dentures noted. Cardiovascular: Tachycardic. Regular rhythm. No murmur.  Respiratory: CTAB. No Rales, rhonchi or wheezing. Abdomen: Midline scar noted. Flat, soft, nondistended.  Mildly tender to palpation diffusely. No right upper quadrant tenderness. No rebound or guarding. Extremities: Bilateral BKA's noted. Skin: Warm, dry, intact.  Neuro: Alert and oriented x3. No focal deficits.  Labs and Imaging: CBC BMET   Recent Labs Lab 09/09/14 0036  WBC 7.4  HGB 20.5*  HCT 60.5*  PLT 247  Recent Labs Lab 09/09/14 0143  NA 135*  K 4.1  CL 100  CO2 23  BUN 11  CREATININE 1.09  GLUCOSE 122*  CALCIUM 8.8     CMP     Component Value Date/Time   NA 135* 09/09/2014 0143   K 4.1 09/09/2014 0143   CL 100 09/09/2014 0143   CO2 23 09/09/2014 0143   GLUCOSE 122* 09/09/2014 0143   BUN 11 09/09/2014 0143   CREATININE 1.09 09/09/2014 0143   CREATININE 1.20 05/30/2014 1415   CALCIUM 8.8 09/09/2014 0143   CALCIUM 8.4 09/08/2011 0746   PROT 6.7 09/09/2014 0143   ALBUMIN 3.1* 09/09/2014 0143   AST 15 09/09/2014 0143   ALT 14 09/09/2014 0143   ALKPHOS 126* 09/09/2014 0143   BILITOT 0.5 09/09/2014 0143   GFRNONAA 84* 09/09/2014 0143   GFRAA >90 09/09/2014 0143   Lipase - 97.  Urinalysis    Component Value Date/Time   COLORURINE YELLOW 09/09/2014 0238   APPEARANCEUR CLEAR 09/09/2014 0238   LABSPEC 1.014 09/09/2014 0238   PHURINE 6.5 09/09/2014 0238   GLUCOSEU NEGATIVE 09/09/2014 0238   HGBUR SMALL*  09/09/2014 0238   HGBUR negative 10/08/2010 0853   BILIRUBINUR NEGATIVE 09/09/2014 0238   KETONESUR 15* 09/09/2014 0238   PROTEINUR 30* 09/09/2014 0238   UROBILINOGEN 1.0 09/09/2014 0238   NITRITE NEGATIVE 09/09/2014 0238   LEUKOCYTESUR NEGATIVE 09/09/2014 0238   Drugs of Abuse     Component Value Date/Time   LABOPIA POSITIVE* 09/09/2014 0238   LABOPIA NEG 01/06/2011 2114   COCAINSCRNUR NONE DETECTED 09/09/2014 0238   COCAINSCRNUR NEG 01/21/2011 2024   LABBENZ NONE DETECTED 09/09/2014 0238   LABBENZ NEG 01/21/2011 2024   AMPHETMU NONE DETECTED 09/09/2014 0238   AMPHETMU NEG 01/21/2011 2024   THCU POSITIVE* 09/09/2014 0238   LABBARB NONE DETECTED 09/09/2014 0238    Dg Chest Portable 1 View 09/07/2014   IMPRESSION: No active cardiopulmonary disease.    Dg Abd Acute W/chest 09/09/2014   IMPRESSION: 1.  No evidence for acute cardiopulmonary abnormality. 2. Nonobstructive bowel gas pattern. 3. Postoperative changes.     Coral Spikes, DO 09/09/2014, 5:08 AM PGY-3, [place] Intern pager: [phone], text pages welcome

## 2014-09-09 NOTE — ED Notes (Signed)
Paged IV Team for IV start.

## 2014-09-09 NOTE — Progress Notes (Signed)
I have seen and evaluated Mr. Bruney.  I appreciate the care the inpatient team has provided for him.  I will continue to follow along.  Thanks. Tamela Oddi Awanda Mink, DO of [facility] 09/09/2014, 12:41 PM

## 2014-09-09 NOTE — H&P (Signed)
FMTS Attending Note  I personally saw and evaluated the patient at 0730. The plan of care was discussed with the resident team. I agree with the assessment and plan as documented by the resident.   38 y/o male with PMH IDT2DM (resolved s/p pancreas transplant), ESRD (resolved s/p renal transplant), uncontrolled HTN, GERD, HLD, and gastroparesis presents with intractable nausea and vomiting. Please refer to resident note for HPI. Patient has had previous abdominal surgery including renal transplant, still has gallbladder and appendix.   Vitals: reviewed Gen: pleasant AAM, appears in pain HEENT: normocephalic, PERRL, EOMI, dry mucous membranes Cardiac: tachycardic, S1 and S2 present, no murmurs, no heaves/thrills Abd: midline scar present, diffuse tenderness,  Resp: CTAB, normal effort Ext: bilateral BKA  Reviewed lab work, ekg (inferior/septal Q waves, evidence for bi-atrial enlargement, no acute ischemic changes), and imaging (no acute pathology on abd xray).   Assessment and plan: 38 y/o male presents with intractable nausea and vomiting.  1. Intractable nausea and vomiting - unclear etiology however favor gastroparesis vs pancreatitis, agree with bowel rest, IVF, and pain control 2. Accelerated HTN - no evidence of end organ damage, agree with prn hydralazine, pain is likely contributing 3. History of ESRD/renal transplant - creatinine stable, continue immunosuppressant medications 4. History of Diabetes/pancreas transplant - monitor blood glucoses, check A1C 5. Other medical issues stable

## 2014-09-09 NOTE — ED Provider Notes (Signed)
CSN: [id number]     Arrival date & time 09/08/14  2343 History   First MD Initiated Contact with Patient 09/09/14 0041     Chief Complaint  Patient presents with  . Nausea  . Emesis     (Consider location/radiation/quality/duration/timing/severity/associated sxs/prior Treatment) The history is provided by the patient.  MUZAMIL KAWECKI is a 38 y.o. male hx of HTN, GERD, DM, renal and pancreas transplant, gastroparesis here with ab pain, vomiting. Patient has been vomiting for the last 5 days. Nonbilious vomiting. Also some diarrhea as well and diffuse abdominal pain. Came yesterday and had unremarkable labs and was given pain meds, zofran and IVF and sent home. Didn't fill his zofran or vicodin. Has persistent nausea and vomiting. Last admission was in March for gastroparesis. Has hx of marijuana use and last time was 5 days ago.    Past Medical History  Diagnosis Date  . Hypertension   . GERD (gastroesophageal reflux disease)   . Gastropathy   . Chronic pain   . Depression   . Edema   . Headache(784.0)   . 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 Dialysis patient 04/18/12    "[facility]; [place], [place], [place]"  . Blood transfusion   . Arthritis     "I think I do; just in my fingers & my hands"  . Type I diabetes mellitus     "juvenile"  . Gastroparesis   . ESRD (end stage renal disease) on dialysis    Past Surgical History  Procedure Laterality Date  . Below knee leg amputation  "it's been awhile"    bilaterally  . Cataract extraction  ~ 2011    right  . Av fistula placement  08/2011    left upper arm  . Combined kidney-pancreas transplant     Family History  Problem Relation Age of Onset  . Diabetes Other   . Hypertension Other    History  Substance Use Topics  . Smoking status: Never Smoker   . Smokeless tobacco: Never Used  . Alcohol Use: No    Review of Systems  Gastrointestinal: Positive for vomiting and abdominal pain.  All other systems  reviewed and are negative.     Allergies  Review of patient's allergies indicates no known allergies.  Home Medications   Prior to Admission medications   Medication Sig Start Date End Date Taking? Authorizing Provider  aspirin EC 81 MG tablet Take 81 mg by mouth daily.   Yes Historical Provider, MD  HYDROcodone-acetaminophen (NORCO/VICODIN) 5-325 MG per tablet Take 1 tablet by mouth every 4 (four) hours as needed for moderate pain or severe pain. 09/07/14  Yes Tammy Sours, MD  mycophenolate (MYFORTIC) 180 MG EC tablet Take 720 mg by mouth 2 (two) times daily.  07/06/13  Yes Historical Provider, MD  omeprazole (PRILOSEC) 20 MG capsule Take 1 capsule (20 mg total) by mouth daily as needed (heartburn). 06/06/14  Yes Kalman Drape, MD  ondansetron (ZOFRAN ODT) 4 MG disintegrating tablet Take 1 tablet (4 mg total) by mouth every 8 (eight) hours as needed for nausea or vomiting. 09/07/14  Yes Tammy Sours, MD  predniSONE (DELTASONE) 5 MG tablet Take 5 mg by mouth daily.  06/20/13  Yes Historical Provider, MD  sulfamethoxazole-trimethoprim (BACTRIM,SEPTRA) 400-80 MG per tablet Take 1 tablet by mouth every Monday, Wednesday, and Friday.    Yes Historical Provider, MD  tacrolimus (PROGRAF) 1 MG capsule Take 3 mg by mouth 2 (two) times daily.  Take 2 capsules by mouth in the AM and 1 capsule in the evening 07/16/13  Yes Historical Provider, MD   BP 172/103  Pulse 99  Temp(Src) 98.8 F (37.1 C) (Oral)  Resp 22  SpO2 99% Physical Exam  Nursing note and vitals reviewed. Constitutional: He is oriented to person, place, and time.  Uncomfortable   HENT:  Head: Normocephalic.  MM dry   Eyes: Conjunctivae and EOM are normal. Pupils are equal, round, and reactive to light.  Neck: Normal range of motion. Neck supple.  Cardiovascular: Regular rhythm.   Slightly tachy   Pulmonary/Chest: Effort normal and breath sounds normal. No respiratory distress. He has no wheezes. He has no rales.  Abdominal:  Mild  diffuse tenderness, voluntary guarding. No obvious rebound   Musculoskeletal: Normal range of motion. He exhibits no edema and no tenderness.  Neurological: He is alert and oriented to person, place, and time. No cranial nerve deficit. Coordination normal.  Skin: Skin is warm and dry.  Psychiatric: He has a normal mood and affect. His behavior is normal. Judgment and thought content normal.    ED Course  Procedures (including critical care time) Labs Review Labs Reviewed  CBC WITH DIFFERENTIAL - Abnormal; Notable for the following:    RBC 6.44 (*)    Hemoglobin 20.5 (*)    HCT 60.5 (*)    Neutrophils Relative % 86 (*)    Lymphocytes Relative 4 (*)    Lymphs Abs 0.3 (*)    All other components within normal limits  URINALYSIS, ROUTINE W REFLEX MICROSCOPIC - Abnormal; Notable for the following:    Hgb urine dipstick SMALL (*)    Ketones, ur 15 (*)    Protein, ur 30 (*)    All other components within normal limits  COMPREHENSIVE METABOLIC PANEL - Abnormal; Notable for the following:    Sodium 135 (*)    Glucose, Bld 122 (*)    Albumin 3.1 (*)    Alkaline Phosphatase 126 (*)    GFR calc non Af Amer 84 (*)    All other components within normal limits  LIPASE, BLOOD - Abnormal; Notable for the following:    Lipase 97 (*)    All other components within normal limits  URINE MICROSCOPIC-ADD ON  URINE RAPID DRUG SCREEN (HOSP PERFORMED)  I-STAT TROPOININ, ED    Imaging Review Dg Chest Portable 1 View  09/07/2014   CLINICAL DATA:  EMESIS  EXAM: PORTABLE CHEST - 1 VIEW  COMPARISON:  Prior radiograph from 04/23/2014  FINDINGS: The cardiac and mediastinal silhouettes are stable in size and contour, and remain within normal limits.  The lungs are normally inflated. No airspace consolidation, pleural effusion, or pulmonary edema is identified. There is no pneumothorax. Nodular shadows overlying the bilateral lung bases are favored to reflect nipple shadows.  No acute osseous abnormality  identified.  IMPRESSION: No active cardiopulmonary disease.   Electronically Signed   By: Jeannine Boga M.D.   On: 09/07/2014 23:15   Dg Abd Acute W/chest  09/09/2014   CLINICAL DATA:  Abdominal pain. Vomiting. History of kidney and pancreas transplant.  EXAM: ACUTE ABDOMEN SERIES (ABDOMEN 2 VIEW & CHEST 1 VIEW)  COMPARISON:  09/07/2014  FINDINGS: The heart is mildly enlarged. There are no focal consolidations or pleural effusions. Surgical clips are noted in the upper abdomen. Bowel gas pattern is nonobstructive. Vascular calcifications are present. No evidence for organomegaly. No abnormal calcifications.  IMPRESSION: 1.  No evidence for acute cardiopulmonary abnormality. 2.  Nonobstructive bowel gas pattern. 3. Postoperative changes.   Electronically Signed   By: Shon Hale M.D.   On: 09/09/2014 01:46     EKG Interpretation   Date/Time:  Tuesday September 09 2014 00:02:38 EDT Ventricular Rate:  107 PR Interval:  155 QRS Duration: 76 QT Interval:  330 QTC Calculation: 440 R Axis:   -83 Text Interpretation:  Sinus tachycardia Biatrial enlargement Inferior  infarct, old Anterior infarct, old No significant change since last  tracing Confirmed by [place]  MD, DAVID ([id number]) on 09/09/2014 12:11:39 AM      MDM   Final diagnoses:  None   Trayvond VANDELL GOULET is a 38 y.o. male here with ab pain, vomiting. Likely gastroparesis vs gastroenteritis. Will repeat labs, hydrate and reassess.   3:18 AM Still unable to tolerate PO after zofran, phenergan, morphine, IVF. Hemoconcentrated by labs. Will admit for observation.     Wandra Arthurs, MD 09/09/14 [phone]

## 2014-09-09 NOTE — ED Notes (Signed)
Pt complaining of nausea, vomiting and hiccups x 5 days. Was at [facility] ER last night for same complaints. States he is not better. Tearful during assessment.

## 2014-09-09 NOTE — ED Notes (Signed)
Patient transported to X-ray 

## 2014-09-09 NOTE — ED Notes (Signed)
Pt states he just peed when he pooped in the bedpan.

## 2014-09-09 NOTE — ED Notes (Signed)
Dr. Lacinda Axon, family practice, at bedside assessing patient

## 2014-09-09 NOTE — Care Management Note (Unsigned)
    Page 1 of 1   09/09/2014     4:44:00 PM CARE MANAGEMENT NOTE 09/09/2014  Patient:  [name], [name]   Account Number:  [id number]  Date Initiated:  09/09/2014  Documentation initiated by:  Jaysten Essner  Subjective/Objective Assessment:   Pt adm with intractable N/V and abd pain on 09/08/14.  PMH of recent kidney and pancreas transplant.  Independent prior to admission; lives alone.     Action/Plan:   Will follow for dc needs as pt progresses.   Anticipated DC Date:  09/12/2014   Anticipated DC Plan:  [place]  CM consult      Choice offered to / List presented to:             Status of service:  In process, will continue to follow Medicare Important Message given?   (If response is "NO", the following Medicare IM given date fields will be blank) Date Medicare IM given:   Medicare IM given by:   Date Additional Medicare IM given:   Additional Medicare IM given by:    Discharge Disposition:    Per UR Regulation:  Reviewed for med. necessity/level of care/duration of stay  If discussed at [place] of Stay Meetings, dates discussed:    Comments:

## 2014-09-10 DIAGNOSIS — K3184 Gastroparesis: Secondary | ICD-10-CM | POA: Diagnosis present

## 2014-09-10 DIAGNOSIS — S88119A Complete traumatic amputation at level between knee and ankle, unspecified lower leg, initial encounter: Secondary | ICD-10-CM | POA: Diagnosis not present

## 2014-09-10 DIAGNOSIS — E785 Hyperlipidemia, unspecified: Secondary | ICD-10-CM | POA: Diagnosis present

## 2014-09-10 DIAGNOSIS — E860 Dehydration: Secondary | ICD-10-CM | POA: Diagnosis present

## 2014-09-10 DIAGNOSIS — R109 Unspecified abdominal pain: Secondary | ICD-10-CM | POA: Diagnosis present

## 2014-09-10 DIAGNOSIS — Z7982 Long term (current) use of aspirin: Secondary | ICD-10-CM | POA: Diagnosis not present

## 2014-09-10 DIAGNOSIS — Z23 Encounter for immunization: Secondary | ICD-10-CM | POA: Diagnosis not present

## 2014-09-10 DIAGNOSIS — F329 Major depressive disorder, single episode, unspecified: Secondary | ICD-10-CM | POA: Diagnosis present

## 2014-09-10 DIAGNOSIS — G8929 Other chronic pain: Secondary | ICD-10-CM | POA: Diagnosis present

## 2014-09-10 DIAGNOSIS — Z940 Kidney transplant status: Secondary | ICD-10-CM | POA: Diagnosis not present

## 2014-09-10 DIAGNOSIS — K219 Gastro-esophageal reflux disease without esophagitis: Secondary | ICD-10-CM | POA: Diagnosis present

## 2014-09-10 DIAGNOSIS — Z9483 Pancreas transplant status: Secondary | ICD-10-CM | POA: Diagnosis not present

## 2014-09-10 DIAGNOSIS — F3289 Other specified depressive episodes: Secondary | ICD-10-CM | POA: Diagnosis present

## 2014-09-10 DIAGNOSIS — Z9849 Cataract extraction status, unspecified eye: Secondary | ICD-10-CM | POA: Diagnosis not present

## 2014-09-10 DIAGNOSIS — Z79899 Other long term (current) drug therapy: Secondary | ICD-10-CM | POA: Diagnosis not present

## 2014-09-10 DIAGNOSIS — E1149 Type 2 diabetes mellitus with other diabetic neurological complication: Secondary | ICD-10-CM | POA: Diagnosis present

## 2014-09-10 DIAGNOSIS — R112 Nausea with vomiting, unspecified: Secondary | ICD-10-CM | POA: Diagnosis present

## 2014-09-10 DIAGNOSIS — I10 Essential (primary) hypertension: Secondary | ICD-10-CM | POA: Diagnosis present

## 2014-09-10 DIAGNOSIS — F1210 Cannabis abuse, uncomplicated: Secondary | ICD-10-CM | POA: Diagnosis present

## 2014-09-10 LAB — TACROLIMUS LEVEL: Tacrolimus (FK506) - LabCorp: 4 ng/mL

## 2014-09-10 MED ORDER — AMLODIPINE BESYLATE 5 MG PO TABS: 10.0000 mg | ORAL_TABLET | Freq: Every day | ORAL | Status: DC

## 2014-09-10 MED ORDER — SULFAMETHOXAZOLE-TRIMETHOPRIM 400-80 MG PO TABS
1.0000 | ORAL_TABLET | ORAL | Status: DC
  Administered 2014-09-10: 1 via ORAL
  Filled 2014-09-10: qty 1

## 2014-09-10 MED ORDER — AMLODIPINE BESYLATE 5 MG PO TABS: 5.0000 mg | ORAL_TABLET | Freq: Every day | ORAL | Status: DC

## 2014-09-10 MED ORDER — PANTOPRAZOLE SODIUM 40 MG PO TBEC
40.0000 mg | DELAYED_RELEASE_TABLET | Freq: Every day | ORAL | Status: DC
  Administered 2014-09-10: 40 mg via ORAL
  Filled 2014-09-10: qty 1

## 2014-09-10 MED ORDER — AMLODIPINE BESYLATE 5 MG PO TABS
5.0000 mg | ORAL_TABLET | Freq: Every day | ORAL | Status: DC
  Administered 2014-09-10: 5 mg via ORAL
  Filled 2014-09-10: qty 1

## 2014-09-10 NOTE — Discharge Summary (Signed)
FMTS ATTENDING  NOTE Calub Tarnow,MD I  have seen and examined this patient, reviewed their chart. I have discussed this patient with the resident. I agree with the resident's findings, assessment and care plan. 

## 2014-09-10 NOTE — Progress Notes (Signed)
Went over discharge instructions with the patient. Patient had no additional questions or concerns related to discharge instructions. IV d/c'd. Patient taken off cardiac monitor. Patient discharged home. Roxan Hockey, RN

## 2014-09-10 NOTE — Discharge Instructions (Signed)

## 2014-09-10 NOTE — Progress Notes (Signed)
Patient tolerated clear liquid diet for breakfast and lunch. No n/v or upset stomach. Will advance diet. Roxan Hockey, RN

## 2014-09-10 NOTE — Discharge Summary (Signed)
[facility] Discharge Summary  Patient name: [name] Medical record number: [id number] Date of birth: 01-Jun-1976 Age: 38 y.o. Gender: male Date of Admission: 09/08/2014  Date of Discharge: 09/10/14 Admitting Physician: Lupita Dawn, MD  Primary Care Provider: Kennith Maes, DO Consultants: None  Indication for Hospitalization: Abdominal Pain secondary to Gastroparesis  Discharge Diagnoses/Problem List:  Gastroparesis H/O DM s/p Pancreas Transplant H/O ESRD s/p Kidney Transplant HTN Marijuana Abuse  Disposition: Discharge Home  Discharge Condition: Stable  Brief Hospital Course:  Mr. Falzon is a 38yo male with history of DM and ESRD s/p pancreas and kidney transplant in 05/2013, uncontrolled HTN, GERD< and HLD, who presented to the ED on 09/09/14 with intractable nausea, vomiting, and abdominal pain and was admitted to the [facility].  Lipase as mildly elevated at 97. UDS was positive for opioids and marijuana. CBC showed elevated hemoglobin of 20.5, which is believed to be secondary to dehydration.  Troponins were clear times 3. CXR showed no active cardiopulmonary disease, a nonobstructive bowel gas pattern, and postoperative changes.   Mr. Riccelli was made NPO and given fluids, Protonix, Reglan, and Zofran PRN. His diet was advanced as tolerated on 09/10/14. His immunosuppressant medications (prednisone, mycophenolate, and prograf) were continued during hospitalization.  A1C was 5.6.  BP was labile throughout hospitalization ranging from 112-202 / 47-137.  He was treated with Hydralazine and Amlodipine.  BP was 156/85 at time of discharge.  Lipid panel showed cholesterol 132, TG 80, LDL 65, and HDL 51.  Mr. Janssen was discharged on 09/10/14 following improvement in abdominal pain, nausea, and vomiting.  Issues for Follow Up:  -Monitor BP.  Range was labile throughout hospitalization and was 156/85 at time of discharge.  Discharged on Amlodipine 10mg .   -Follow up abdominal pain, nausea, and vomiting.  Resolved at time of discharge.  Significant Procedures: None  Significant Labs and Imaging:   Recent Labs Lab 09/07/14 2235 09/09/14 0036 09/09/14 0500  WBC 9.5 7.4 7.7  HGB 17.9* 20.5* 17.5*  HCT 55.2* 60.5* 52.2*  PLT 160 247 168    Recent Labs Lab 09/07/14 2044 09/07/14 2109 09/07/14 2235 09/09/14 0143 09/09/14 0500  NA 135* 142  --  135* 137  K 7.5* 3.1*  --  4.1 4.0  CL 101 108  --  100 101  CO2  --   --   --  23 24  GLUCOSE 115* 97  --  122* 103*  BUN 21 12  --  11 10  CREATININE 1.20 0.90  --  1.09 1.07  CALCIUM  --   --   --  8.8 8.8  ALKPHOS  --   --  84 126* 120*  AST  --   --  11 15 11   ALT  --   --  9 14 13   ALBUMIN  --   --  2.0* 3.1* 3.0*   -Lipid Panel: Cholesterol 132, TG 80, LDL 65, HDL 51  -A1C- 5.6   -Acute Abdomen with Chest Xray- no active cardiopulmonary disease, a nonobstructive bowel gas pattern, and postoperative changes   Results/Tests Pending at Time of Discharge: None  Discharge Medications:    Medication List         amLODipine 5 MG tablet  Commonly known as:  NORVASC  Take 2 tablets (10 mg total) by mouth daily.     aspirin EC 81 MG tablet  Take 81 mg by mouth daily.     HYDROcodone-acetaminophen 5-325 MG per  tablet  Commonly known as:  NORCO/VICODIN  Take 1 tablet by mouth every 4 (four) hours as needed for moderate pain or severe pain.     MYFORTIC 180 MG EC tablet  Generic drug:  mycophenolate  Take 720 mg by mouth 2 (two) times daily.     omeprazole 20 MG capsule  Commonly known as:  PRILOSEC  Take 1 capsule (20 mg total) by mouth daily as needed (heartburn).     ondansetron 4 MG disintegrating tablet  Commonly known as:  ZOFRAN ODT  Take 1 tablet (4 mg total) by mouth every 8 (eight) hours as needed for nausea or vomiting.     predniSONE 5 MG tablet  Commonly known as:  DELTASONE  Take 5 mg by mouth daily.     sulfamethoxazole-trimethoprim 400-80 MG per  tablet  Commonly known as:  BACTRIM,SEPTRA  Take 1 tablet by mouth every Monday, Wednesday, and Friday.     tacrolimus 1 MG capsule  Commonly known as:  PROGRAF  Take 3 mg by mouth 2 (two) times daily.        Discharge Instructions: Please refer to Patient Instructions section of EMR for full details.  Patient was counseled important signs and symptoms that should prompt return to medical care, changes in medications, dietary instructions, activity restrictions, and follow up appointments.   Follow-Up Appointments: Follow-up Information   Follow up with Cordelia Poche, MD On 09/16/2014. (@10 :15am for Hospital Follow Up)    Specialty:  Family Medicine   Contact information:   [place] [id number] [place], DO 09/10/2014, 4:40 PM PGY-1, [place]

## 2014-09-10 NOTE — Progress Notes (Signed)
FMTS ATTENDING NOTE Andrew Eniola,MD I  have seen and examined this patient, reviewed their chart. I have discussed this patient with the resident. I agree with the resident's findings, assessment and care plan. Patient improved with no N/V or diarrhea overnight. Plan to advance diet. His BP remains high, which has been previously controlled in the last few weeks to months although it fluctuate. He denies any HA, no vision change. I agree with addition of Norvasc, we will monitor BP for improvement.

## 2014-09-10 NOTE — Progress Notes (Signed)
Family Medicine Teaching Service Daily Progress Note Intern Pager: [phone]  Patient name: [name] Medical record number: [id number] Date of birth: [birth year] Age: 38 y.o. Gender: male  Primary Care Provider: Kennith Maes, DO Consultants: None Code Status: Full  Assessment and Plan: Ronav Clary is a 38 y.o. male presenting with intractable nausea, vomiting, and abdominal pain. PMH is significant for DM, Hx of ESRD (patient now s/p pancreatic and kidney transplant 05/2013), uncontrolled HTN, GERD, HLD, Gastroparesis, and depression.   # Intractable nausea, vomiting, and abdominal pain - Secondary to gastroparesis vs marijuana use. There also could be an element of pancreatitis given Lipase of 97 (less likely as this is only mildy elevated).  - NPO and bowel rest  -Advance diet as tolerated today. - IV fluids - NS @ 100 mL/hr (s/p 1 NS bolus in ED) - Protonix, Reglan, Zofran - UDS positive for opioids and THC  # DM, Hx of ESRD (s/p pancreatic and kidney transplant 05/2013)  - Continuing home immunosuppressant medications - prednisone 5 mg daily, mycophenolate 720 mg twice a day, Prograf 3 mg twice a day.  - A1C- 5.6  # HTN- 24hr range: 112-198 / 47-112 (198/104)   - PRN hydralazine  -Started Amlodipine 5mg  daily - Consider addition of oral agent during admission.  #HLD -Lipid Panel:  Cholesterol 132, TG 80, LDL 65, HDL 51   FEN/GI: NPO; IV fluids - NS @ 100 mL/hr  Prophylaxis: Heparin SQ  Disposition: Admitted to [facility] Medicine Teaching Service. Anticipated discharge today pending improvement of BP.  Subjective:  No complaints overnight.  States abdominal pain is resolved.  Would like omeprazole today to prevent acid reflux.  Denies nausea and vomiting.  Going to attempt to eat lunch today.  No further complaints.  Objective: Temp:  [98.1 F (36.7 C)-98.9 F (37.2 C)] 98.9 F (37.2 C) (09/16 0500) Pulse Rate:  [95-106] 106 (09/16 0500) Resp:  [17-21] 18  (09/16 0500) BP: (112-198)/(47-112) 198/104 mmHg (09/16 0500) SpO2:  [100 %] 100 % (09/16 0500) Physical Exam: General: 38yo male resting comfortably in no apparent distress Cardiovascular: S1 and S2 noted. No murmurs noted. Regular rate and rhythm. Respiratory: Clear to auscultation bilaterally.  No wheezing noted.  No increased work of breathing. Abdomen: Bowel sounds noted. No tenderness to palpation. Soft and nondistended.  Laboratory:  Recent Labs Lab 09/07/14 2235 09/09/14 0036 09/09/14 0500  WBC 9.5 7.4 7.7  HGB 17.9* 20.5* 17.5*  HCT 55.2* 60.5* 52.2*  PLT 160 247 168    Recent Labs Lab 09/07/14 2109 09/07/14 2235 09/09/14 0143 09/09/14 0500  NA 142  --  135* 137  K 3.1*  --  4.1 4.0  CL 108  --  100 101  CO2  --   --  23 24  BUN 12  --  11 10  CREATININE 0.90  --  1.09 1.07  CALCIUM  --   --  8.8 8.8  PROT  --  4.2* 6.7 6.3  BILITOT  --  0.4 0.5 0.6  ALKPHOS  --  84 126* 120*  ALT  --  9 14 13   AST  --  11 15 11   GLUCOSE 97  --  122* 103*   -Lipid Panel:  Cholesterol 132, TG 80, LDL 65, HDL 51  -A1C- 5.6  Imaging/Diagnostic Tests: -Acute Abdomen with Chest- Nonobstructive bowel gas pattern.    [place], [place] 09/10/2014, 7:29 AM PGY-1, [place] Intern pager: [phone], text pages welcome

## 2014-09-15 NOTE — ED Provider Notes (Signed)
I saw and evaluated the patient, reviewed the resident's note and I agree with the findings and plan.   .Face to face Exam:  General:  Awake HEENT:  Atraumatic Resp:  Normal effort Abd:  Nondistended Neuro:No focal weakness   The EKG was reviewed and discussed with the resident.  Dot Lanes, MD 09/15/14 [phone]

## 2014-09-16 ENCOUNTER — Inpatient Hospital Stay: Payer: Medicare Other | Admitting: Family Medicine

## 2014-10-14 ENCOUNTER — Other Ambulatory Visit: Payer: Self-pay | Admitting: *Deleted

## 2014-10-14 MED ORDER — ONDANSETRON 4 MG PO TBDP: 4.0000 mg | ORAL_TABLET | Freq: Three times a day (TID) | ORAL | Status: DC | PRN

## 2014-10-15 ENCOUNTER — Other Ambulatory Visit: Payer: Self-pay | Admitting: Family Medicine

## 2014-10-15 MED ORDER — ONDANSETRON 4 MG PO TBDP: 4.0000 mg | ORAL_TABLET | Freq: Three times a day (TID) | ORAL | Status: DC | PRN

## 2014-11-01 ENCOUNTER — Observation Stay (HOSPITAL_COMMUNITY)
Admission: EM | Admit: 2014-11-01 | Discharge: 2014-11-02 | Disposition: A | Discharge status: Home / Self Care | Payer: Medicare Other | Attending: Family Medicine | Admitting: Family Medicine

## 2014-11-01 ENCOUNTER — Encounter (HOSPITAL_COMMUNITY): Payer: Self-pay | Admitting: Emergency Medicine

## 2014-11-01 DIAGNOSIS — Z992 Dependence on renal dialysis: Secondary | ICD-10-CM | POA: Insufficient documentation

## 2014-11-01 DIAGNOSIS — Z940 Kidney transplant status: Secondary | ICD-10-CM | POA: Diagnosis not present

## 2014-11-01 DIAGNOSIS — R112 Nausea with vomiting, unspecified: Secondary | ICD-10-CM | POA: Diagnosis not present

## 2014-11-01 DIAGNOSIS — Z7952 Long term (current) use of systemic steroids: Secondary | ICD-10-CM | POA: Diagnosis not present

## 2014-11-01 DIAGNOSIS — Z7982 Long term (current) use of aspirin: Secondary | ICD-10-CM | POA: Insufficient documentation

## 2014-11-01 DIAGNOSIS — I120 Hypertensive chronic kidney disease with stage 5 chronic kidney disease or end stage renal disease: Secondary | ICD-10-CM | POA: Diagnosis not present

## 2014-11-01 DIAGNOSIS — Z9483 Pancreas transplant status: Secondary | ICD-10-CM | POA: Diagnosis not present

## 2014-11-01 DIAGNOSIS — E119 Type 2 diabetes mellitus without complications: Secondary | ICD-10-CM | POA: Diagnosis not present

## 2014-11-01 DIAGNOSIS — N186 End stage renal disease: Secondary | ICD-10-CM | POA: Diagnosis not present

## 2014-11-01 DIAGNOSIS — Z79899 Other long term (current) drug therapy: Secondary | ICD-10-CM | POA: Diagnosis not present

## 2014-11-01 DIAGNOSIS — Z79891 Long term (current) use of opiate analgesic: Secondary | ICD-10-CM | POA: Insufficient documentation

## 2014-11-01 LAB — CBC WITH DIFFERENTIAL/PLATELET
Basophils Absolute: 0 K/uL (ref 0.0–0.1)
Basophils Relative: 0 % (ref 0–1)
Eosinophils Absolute: 0.1 K/uL (ref 0.0–0.7)
Eosinophils Relative: 2 % (ref 0–5)
HCT: 56.2 % — ABNORMAL HIGH (ref 39.0–52.0)
Hemoglobin: 18.4 g/dL — ABNORMAL HIGH (ref 13.0–17.0)
Lymphocytes Relative: 10 % — ABNORMAL LOW (ref 12–46)
Lymphs Abs: 0.5 K/uL — ABNORMAL LOW (ref 0.7–4.0)
MCH: 30.6 pg (ref 26.0–34.0)
MCHC: 32.7 g/dL (ref 30.0–36.0)
MCV: 93.4 fL (ref 78.0–100.0)
Monocytes Absolute: 0.6 K/uL (ref 0.1–1.0)
Monocytes Relative: 12 % (ref 3–12)
Neutro Abs: 3.6 K/uL (ref 1.7–7.7)
Neutrophils Relative %: 76 % (ref 43–77)
Platelets: 145 K/uL — ABNORMAL LOW (ref 150–400)
RBC: 6.02 MIL/uL — ABNORMAL HIGH (ref 4.22–5.81)
RDW: 14.6 % (ref 11.5–15.5)
WBC: 4.8 K/uL (ref 4.0–10.5)

## 2014-11-01 LAB — LIPASE, BLOOD: LIPASE: 105 U/L — AB (ref 11–59)

## 2014-11-01 LAB — COMPREHENSIVE METABOLIC PANEL
ALT: 17 U/L (ref 0–53)
AST: 15 U/L (ref 0–37)
Albumin: 3.6 g/dL (ref 3.5–5.2)
Alkaline Phosphatase: 144 U/L — ABNORMAL HIGH (ref 39–117)
Anion gap: 13 (ref 5–15)
BILIRUBIN TOTAL: 0.4 mg/dL (ref 0.3–1.2)
BUN: 12 mg/dL (ref 6–23)
CHLORIDE: 105 meq/L (ref 96–112)
CO2: 22 mEq/L (ref 19–32)
CREATININE: 1.11 mg/dL (ref 0.50–1.35)
Calcium: 9 mg/dL (ref 8.4–10.5)
GFR calc Af Amer: >90 mL/min (ref >90)
GFR, EST NON AFRICAN AMERICAN: 82 mL/min — AB (ref >90)
Glucose, Bld: 109 mg/dL — ABNORMAL HIGH (ref 70–99)
Potassium: 4.7 mEq/L (ref 3.7–5.3)
Sodium: 140 mEq/L (ref 137–147)
Total Protein: 7.3 g/dL (ref 6.0–8.3)

## 2014-11-01 LAB — RAPID URINE DRUG SCREEN, HOSP PERFORMED
Amphetamines: NOT DETECTED
BARBITURATES: NOT DETECTED
Benzodiazepines: NOT DETECTED
COCAINE: NOT DETECTED
Opiates: NOT DETECTED
Tetrahydrocannabinol: POSITIVE — AB

## 2014-11-01 LAB — I-STAT TROPONIN, ED: TROPONIN I, POC: 0 ng/mL (ref 0.00–0.08)

## 2014-11-01 MED ORDER — MORPHINE SULFATE 2 MG/ML IJ SOLN
2.0000 mg | INTRAMUSCULAR | Status: DC | PRN
  Administered 2014-11-01 – 2014-11-02 (×2): 2 mg via INTRAVENOUS
  Filled 2014-11-01 (×2): qty 1

## 2014-11-01 MED ORDER — MYCOPHENOLATE SODIUM 180 MG PO TBEC
720.0000 mg | DELAYED_RELEASE_TABLET | Freq: Two times a day (BID) | ORAL | Status: DC
  Administered 2014-11-01 – 2014-11-02 (×2): 720 mg via ORAL
  Filled 2014-11-01 (×3): qty 4

## 2014-11-01 MED ORDER — HYDRALAZINE HCL 20 MG/ML IJ SOLN
10.0000 mg | INTRAMUSCULAR | Status: DC | PRN
  Administered 2014-11-01: 10 mg via INTRAVENOUS
  Filled 2014-11-01: qty 1

## 2014-11-01 MED ORDER — HEPARIN SODIUM (PORCINE) 5000 UNIT/ML IJ SOLN
5000.0000 [IU] | Freq: Three times a day (TID) | INTRAMUSCULAR | Status: DC
  Filled 2014-11-01 (×3): qty 1

## 2014-11-01 MED ORDER — ONDANSETRON HCL 4 MG PO TABS: 4.0000 mg | ORAL_TABLET | Freq: Three times a day (TID) | ORAL | Status: DC | PRN

## 2014-11-01 MED ORDER — SODIUM CHLORIDE 0.9 % IV SOLN
INTRAVENOUS | Status: DC
  Administered 2014-11-01 – 2014-11-02 (×2): via INTRAVENOUS

## 2014-11-01 MED ORDER — SODIUM CHLORIDE 0.9 % IV BOLUS (SEPSIS)
1000.0000 mL | Freq: Once | INTRAVENOUS | Status: AC
  Administered 2014-11-01: 1000 mL via INTRAVENOUS

## 2014-11-01 MED ORDER — PROMETHAZINE HCL 25 MG/ML IJ SOLN
12.5000 mg | Freq: Once | INTRAMUSCULAR | Status: AC
  Administered 2014-11-01: 12.5 mg via INTRAVENOUS
  Filled 2014-11-01: qty 1

## 2014-11-01 MED ORDER — ONDANSETRON HCL 4 MG PO TABS: 4.0000 mg | ORAL_TABLET | Freq: Four times a day (QID) | ORAL | Status: DC | PRN

## 2014-11-01 MED ORDER — AMLODIPINE BESYLATE 10 MG PO TABS
10.0000 mg | ORAL_TABLET | Freq: Every day | ORAL | Status: DC
  Administered 2014-11-01 – 2014-11-02 (×2): 10 mg via ORAL
  Filled 2014-11-01 (×2): qty 1

## 2014-11-01 MED ORDER — SULFAMETHOXAZOLE-TRIMETHOPRIM 400-80 MG PO TABS: 1.0000 | ORAL_TABLET | ORAL | Status: DC

## 2014-11-01 MED ORDER — ONDANSETRON HCL 4 MG/2ML IJ SOLN
4.0000 mg | Freq: Once | INTRAMUSCULAR | Status: AC
  Administered 2014-11-01: 4 mg via INTRAVENOUS
  Filled 2014-11-01: qty 2

## 2014-11-01 MED ORDER — TACROLIMUS 1 MG PO CAPS
3.0000 mg | ORAL_CAPSULE | Freq: Two times a day (BID) | ORAL | Status: DC
  Administered 2014-11-01 – 2014-11-02 (×2): 3 mg via ORAL
  Filled 2014-11-01 (×3): qty 3

## 2014-11-01 MED ORDER — ONDANSETRON HCL 4 MG/2ML IJ SOLN
4.0000 mg | Freq: Four times a day (QID) | INTRAMUSCULAR | Status: DC | PRN
  Administered 2014-11-02: 4 mg via INTRAVENOUS
  Filled 2014-11-01: qty 2

## 2014-11-01 MED ORDER — HYDROCODONE-ACETAMINOPHEN 5-325 MG PO TABS: 1.0000 | ORAL_TABLET | ORAL | Status: DC | PRN

## 2014-11-01 MED ORDER — METOCLOPRAMIDE HCL 5 MG/ML IJ SOLN
10.0000 mg | Freq: Three times a day (TID) | INTRAMUSCULAR | Status: DC
  Administered 2014-11-01 – 2014-11-02 (×2): 10 mg via INTRAVENOUS
  Filled 2014-11-01 (×5): qty 2

## 2014-11-01 MED ORDER — PREDNISONE 5 MG PO TABS
5.0000 mg | ORAL_TABLET | Freq: Every day | ORAL | Status: DC
  Administered 2014-11-01: 5 mg via ORAL
  Filled 2014-11-01 (×2): qty 1

## 2014-11-01 MED ORDER — METOCLOPRAMIDE HCL 5 MG/ML IJ SOLN
10.0000 mg | Freq: Once | INTRAMUSCULAR | Status: AC
  Administered 2014-11-01: 10 mg via INTRAVENOUS
  Filled 2014-11-01: qty 2

## 2014-11-01 MED ORDER — PANTOPRAZOLE SODIUM 40 MG IV SOLR
40.0000 mg | INTRAVENOUS | Status: DC
  Administered 2014-11-01: 40 mg via INTRAVENOUS
  Filled 2014-11-01 (×2): qty 40

## 2014-11-01 MED ORDER — ASPIRIN EC 81 MG PO TBEC
81.0000 mg | DELAYED_RELEASE_TABLET | Freq: Every day | ORAL | Status: DC
  Administered 2014-11-01 – 2014-11-02 (×2): 81 mg via ORAL
  Filled 2014-11-01 (×2): qty 1

## 2014-11-01 MED ORDER — HYDROMORPHONE HCL 1 MG/ML IJ SOLN
1.0000 mg | Freq: Once | INTRAMUSCULAR | Status: AC
  Administered 2014-11-01: 1 mg via INTRAVENOUS
  Filled 2014-11-01: qty 1

## 2014-11-01 NOTE — ED Provider Notes (Addendum)
1600 - Care from Dr. Stark Jock. 63M with hx of kidney and pancreas transplant with N/V. BP improving. After 2nd liter of fluid, feeling better, wants to go home.  Despite IV phenergan, unable to tolerate PO, continues to dry heave. Admitted to [facility] Medicine.   1. Non-intractable vomiting with nausea, vomiting of unspecified type      Evelina Bucy, MD 11/01/14 1815  Evelina Bucy, MD 11/01/14 1845

## 2014-11-01 NOTE — ED Provider Notes (Signed)
CSN: [id number]     Arrival date & time 11/01/14  1247 History   First MD Initiated Contact with Patient 11/01/14 1248     Chief Complaint  Patient presents with  . Hypertension    202/183  . Hematemesis     (Consider location/radiation/quality/duration/timing/severity/associated sxs/prior Treatment) HPI Comments: Patient is a 38 year old male with past medical history of diabetes, hypertension, end-stage renal disease for which he had been on dialysis until a renal transplant in 2013. He woke this morning with abdominal cramping and has had multiple episodes of coffee-ground emesis since that time. He denies any diarrhea. Something similar happened approximately 2 months ago for which he was admitted.  Patient is a 38 y.o. male presenting with vomiting. The history is provided by the patient.  Emesis Severity:  Severe Timing:  Constant Emesis appearance: Brown, coffee-ground appearance. Progression:  Worsening Chronicity:  Recurrent Relieved by:  Nothing Worsened by:  Nothing tried Ineffective treatments:  None tried Associated symptoms: abdominal pain   Associated symptoms: no chills, no diarrhea and no fever     Past Medical History  Diagnosis Date  . Hypertension   . GERD (gastroesophageal reflux disease)   . Gastropathy   . Chronic pain   . Depression   . Edema   . Headache(784.0)   . 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 Dialysis patient 04/18/12    "[facility]; [place], [place], [place]"  . Blood transfusion   . Arthritis     "I think I do; just in my fingers & my hands"  . Type I diabetes mellitus     "juvenile"  . Gastroparesis   . ESRD (end stage renal disease) on dialysis    Past Surgical History  Procedure Laterality Date  . Below knee leg amputation  "it's been awhile"    bilaterally  . Cataract extraction  ~ 2011    right  . Av fistula placement  08/2011    left upper arm  . Combined kidney-pancreas transplant     Family History   Problem Relation Age of Onset  . Diabetes Other   . Hypertension Other    History  Substance Use Topics  . Smoking status: Never Smoker   . Smokeless tobacco: Never Used  . Alcohol Use: No    Review of Systems  Constitutional: Negative for chills.  Gastrointestinal: Positive for vomiting and abdominal pain. Negative for diarrhea.  All other systems reviewed and are negative.     Allergies  Review of patient's allergies indicates no known allergies.  Home Medications   Prior to Admission medications   Medication Sig Start Date End Date Taking? Authorizing Provider  amLODipine (NORVASC) 5 MG tablet Take 2 tablets (10 mg total) by mouth daily. 09/10/14   Coldwater N Rumley, DO  aspirin EC 81 MG tablet Take 81 mg by mouth daily.    Historical Provider, MD  HYDROcodone-acetaminophen (NORCO/VICODIN) 5-325 MG per tablet Take 1 tablet by mouth every 4 (four) hours as needed for moderate pain or severe pain. 09/07/14   Tammy Sours, MD  mycophenolate (MYFORTIC) 180 MG EC tablet Take 720 mg by mouth 2 (two) times daily.  07/06/13   Historical Provider, MD  omeprazole (PRILOSEC) 20 MG capsule Take 1 capsule (20 mg total) by mouth daily as needed (heartburn). 06/06/14   Kalman Drape, MD  ondansetron (ZOFRAN ODT) 4 MG disintegrating tablet Take 1 tablet (4 mg total) by mouth every 8 (eight) hours as needed for nausea  or vomiting. 10/15/14   Tamela Oddi Hess, DO  predniSONE (DELTASONE) 5 MG tablet Take 5 mg by mouth daily.  06/20/13   Historical Provider, MD  sulfamethoxazole-trimethoprim (BACTRIM,SEPTRA) 400-80 MG per tablet Take 1 tablet by mouth every Monday, Wednesday, and Friday.     Historical Provider, MD  tacrolimus (PROGRAF) 1 MG capsule Take 3 mg by mouth 2 (two) times daily.  07/16/13   Historical Provider, MD   BP 236/122 mmHg  Pulse 95  Resp 20  Ht 6\' 1"  (1.854 m)  Wt 155 lb (70.308 kg)  BMI 20.45 kg/m2  SpO2 97% Physical Exam  Constitutional: He is oriented to person, place, and  time. He appears well-developed and well-nourished. No distress.  HENT:  Head: Normocephalic and atraumatic.  Mouth/Throat: Oropharynx is clear and moist.  Neck: Normal range of motion. Neck supple.  Cardiovascular: Normal rate, regular rhythm and normal heart sounds.   No murmur heard. Pulmonary/Chest: Effort normal and breath sounds normal. No respiratory distress. He has no wheezes.  Abdominal: Soft. Bowel sounds are normal. He exhibits no distension. There is tenderness.  There is tenderness to palpation in all 4 quadrants. There is no rebound and no guarding.  Musculoskeletal: Normal range of motion. He exhibits no edema.  Lymphadenopathy:    He has no cervical adenopathy.  Neurological: He is alert and oriented to person, place, and time.  Skin: He is not diaphoretic. There is pallor.  Nursing note and vitals reviewed.   ED Course  Procedures (including critical care time) Labs Review Labs Reviewed  COMPREHENSIVE METABOLIC PANEL  LIPASE, BLOOD  CBC WITH DIFFERENTIAL    Imaging Review No results found.    MDM   Final diagnoses:  None    Patient is a 38 year old male with history of type 1 diabetes with bilateral below the knee amputations, gastroparesis. He is status post pancreas and renal transplant approximately 2 years ago. He presents today with complaints of severe nausea, vomiting that started several hours prior to presentation. He was given IV fluids, pain meds, and anti-emetics. At one point he was feeling significantly improved and his workup is essentially unremarkable. His sugar is 109 and electrolytes are otherwise unremarkable. He has a mild elevation of his lipase, significance of which I am uncertain due to his pancreas transplant.   The patient was adamant about being discharged and not staying in the hospital. When discussing this with him, he again became nauseated and began to vomit. He will be given an additional liter of saline and anti-emetics and  we will see how he responds. At this point his care will be signed out to Dr. Mingo Amber. The patient will be reassessed in the next hour. If he is not feeling better he will likely require admission to the hospital.    Veryl Speak, MD 11/02/14 [phone]

## 2014-11-01 NOTE — H&P (Signed)
[facility] Admission History and Physical Service Pager: [phone]  Patient name: [name] Medical record number: [id number] Date of birth: 12-31-1975 Age: 38 y.o. Gender: male  Primary Care Provider: Kennith Maes, DO Consultants: none Code Status: full  Chief Complaint: nausea, vomiting, abdominal pain  Assessment and Plan: [name] is a 38 y.o. male presenting with intractable nausea and vomiting . PMH is significant for DM 2, history of ESRD (patient now status post pancreatic and kidney transplant in June 2014), uncontrolled hypertension, GERD, HLD, gastroparesis, depression  Intractable nausea, vomiting, abdominal pain-  - Most likely etiology is gastroparesis, other likely contributed factors are marijuana use, and mild pancreatitis with lipase elevated 204 today - Admit to MedSurg for IV hydration and IV antibiotics - Blood in emesis- possibly explained by frequent n/v - mallory weiss tears  - gastroccult, IV PPI, Stepdown if becomes recurrent and is witnessed  - Would call GI if recurrent  - NPO, advance diet in the a.m. - IV fluids - IV Protonix, Reglan, Zofran  - Can add Phenergan if needed  DM 2, history of ESRD- now status post pancreatic and kidney transplant in June 2014 - Continue home immunosuppressive medications, he states that he didn't tolerate these by mouth earlier today - A1c was 5.6 on an admission for similar reasons in September of this year  Hypertension - Uncontrolled, ranging 178-236/91-183 - no evidence of end organ damage to consider this hypertensive of emergency - restart home Norvasc - PRN hydralazine   FEN/GI: nothing by mouth for tonight IV NS at 125 mL/hr Prophylaxis: subcutaneous heparin  Disposition: bid for observation to med surge for IV hydration and IV antiemetics  History of Present Illness: [name] is a 38 y.o. male presenting with one days duration of intractable nausea  and vomiting and abdominal pain. Patient states that this morning he took his last Zofran and developed nausea and vomiting after that. He began with dry heaving in the morning and then had several episodes of described coffee-ground emesis and now is transitioned back to dry heaving. He states that he has not tolerated any food today. He was feeling better initially in the ER and about to be discharged when his symptoms returned with any movement. He states that he has been admitted for this before and that his abdominal pain is usually due to dehydration. He describes a diffuse achy abdominal pain that's worsened by vomiting. He denies any changes in his stool.  History history of pancreas and kidney transplant in June  2014 and states that he did tolerate his antirejection meds this morning. He did not take his antihypertensive medication this morning.   States that when he vomits he sometimes has central chest pain with a sensation of vomit not completely clearing his esophagus. He also states that he has some mild dyspnea while he is vomiting.  Review Of Systems: Per HPI, Otherwise 12 point review of systems was performed and was unremarkable.  Patient Active Problem List   Diagnosis Date Noted  . Abdominal pain 09/10/2014  . Intractable nausea and vomiting 09/09/2014  . Stage II pressure ulcer 07/29/2014  . Weakness of left upper extremity 07/03/2014  . Impaired mobility and activities of daily living 07/03/2014  . Sternoclavicular joint pain 03/04/2014  . History of transplantation, renal 07/19/2013  . History of pancreas transplant 07/19/2013  . BKA stump complication [id number]  . S/P bilateral BKA (below knee amputation) 08/17/2012  . Diabetic gastroparesis 07/20/2011  .  Metabolic bone disease 99991111  . ESRD (end stage renal disease) 11/17/2009  . GERD 03/14/2007  . DM (diabetes mellitus), type 2, uncontrolled 02/22/2007  . HYPERLIPIDEMIA 02/22/2007  . DEPRESSION, MAJOR,  RECURRENT 02/22/2007  . POST TRAUMATIC STRESS DISORDER 02/22/2007  . HYPERTENSION, BENIGN SYSTEMIC 02/22/2007  . 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 Hypertension   . GERD (gastroesophageal reflux disease)   . Gastropathy   . Chronic pain   . Depression   . Edema   . Headache(784.0)   . 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 Dialysis patient 04/18/12    "[facility]; [place], [place], [place]"  . Blood transfusion   . Arthritis     "I think I do; just in my fingers & my hands"  . Type I diabetes mellitus     "juvenile"  . Gastroparesis   . ESRD (end stage renal disease) on dialysis    Past Surgical History: Past Surgical History  Procedure Laterality Date  . Below knee leg amputation  "it's been awhile"    bilaterally  . Cataract extraction  ~ 2011    right  . Av fistula placement  08/2011    left upper arm  . Combined kidney-pancreas transplant     Social History: History  Substance Use Topics  . Smoking status: Never Smoker   . Smokeless tobacco: Never Used  . Alcohol Use: No   Additional social history: Please also refer to relevant sections of EMR.  Family History: Family History  Problem Relation Age of Onset  . Diabetes Other   . Hypertension Other    Allergies and Medications: No Known Allergies No current facility-administered medications on file prior to encounter.   Current Outpatient Prescriptions on File Prior to Encounter  Medication Sig Dispense Refill  . amLODipine (NORVASC) 5 MG tablet Take 2 tablets (10 mg total) by mouth daily. 30 tablet 0  . aspirin EC 81 MG tablet Take 81 mg by mouth daily.    [name] HYDROcodone-acetaminophen (NORCO/VICODIN) 5-325 MG per tablet Take 1 tablet by mouth every 4 (four) hours as needed for moderate pain or severe pain. 6 tablet 0  . mycophenolate (MYFORTIC) 180 MG EC tablet Take 720 mg by mouth 2 (two) times daily.     [name] omeprazole (PRILOSEC) 20  MG capsule Take 1 capsule (20 mg total) by mouth daily as needed (heartburn). 30 capsule 0  . ondansetron (ZOFRAN ODT) 4 MG disintegrating tablet Take 1 tablet (4 mg total) by mouth every 8 (eight) hours as needed for nausea or vomiting. 20 tablet 0  . predniSONE (DELTASONE) 5 MG tablet Take 5 mg by mouth daily.     [name] sulfamethoxazole-trimethoprim (BACTRIM,SEPTRA) 400-80 MG per tablet Take 1 tablet by mouth every Monday, Wednesday, and Friday.     . tacrolimus (PROGRAF) 1 MG capsule Take 3 mg by mouth 2 (two) times daily.       Objective: BP 195/104 mmHg  Pulse 92  Temp(Src) 98.2 F (36.8 C) (Oral)  Resp 18  Ht 6\' 1"  (1.854 m)  Wt 155 lb (70.308 kg)  BMI 20.45 kg/m2  SpO2 98% Exam: Gen: NAD, somnolent but easily arousable and conversational HEENT: NCAT, his membranes tachy CV: RRR, good S1/S2, no murmur Resp: CTABL, no wheezes, non-labored Abd: positive BS, soft, mild tenderness to palpation throughout without guarding Ext: bilateral BKA, healed ulcer on left medial stump, 2+ pop pulses Neuro: Alert and oriented,  No gross deficits   Labs and Imaging: CBC BMET   Recent Labs Lab 11/01/14 1315  WBC 4.8  HGB 18.4*  HCT 56.2*  PLT 145*    Recent Labs Lab 11/01/14 1455  NA 140  K 4.7  CL 105  CO2 22  BUN 12  CREATININE 1.11  GLUCOSE 109*  CALCIUM 9.0     lipase105 Troponin 0.00  Urine drug screen from 09/08/2014 positive for opiates and positive for THC  [name], MD 11/01/2014, 7:09 PM PGY-3, [place] Intern pager: [phone], text pages welcome

## 2014-11-01 NOTE — ED Notes (Signed)
Nausea med requested from pharmacy

## 2014-11-01 NOTE — ED Notes (Signed)
Pt resting since the phenergan given.  1000ccnss infused 2nd liter hung.  Iv still positional

## 2014-11-01 NOTE — ED Notes (Signed)
Iv positional.. Phenergan just given.  Nauseated still bp up still

## 2014-11-01 NOTE — ED Notes (Signed)
The pt is now being admitted.  Since the pt roused back up he  Continuing to c/o nausea and pain

## 2014-11-01 NOTE — ED Notes (Signed)
The pt has decided to stay in the hospital when he moves around he feels worse

## 2014-11-01 NOTE — ED Notes (Signed)
Per EMS pt noted to have coffee ground emesis, abd pain, HTN. Pt hx kindey and pancreas transplant June 2014. BP 220/120, HR 99, CBG 198, 99% Rm Air.

## 2014-11-01 NOTE — ED Notes (Signed)
I gave the patient a cup of ice chips.

## 2014-11-01 NOTE — ED Notes (Signed)
The pt is c/o nausea nss half infused

## 2014-11-01 NOTE — ED Notes (Signed)
558ml of nss remainsin the liter bag still positional

## 2014-11-02 DIAGNOSIS — R1110 Vomiting, unspecified: Secondary | ICD-10-CM

## 2014-11-02 DIAGNOSIS — R112 Nausea with vomiting, unspecified: Secondary | ICD-10-CM | POA: Diagnosis not present

## 2014-11-02 LAB — CBC
HEMATOCRIT: 54.2 % — AB (ref 39.0–52.0)
HEMOGLOBIN: 17.7 g/dL — AB (ref 13.0–17.0)
MCH: 30.8 pg (ref 26.0–34.0)
MCHC: 32.7 g/dL (ref 30.0–36.0)
MCV: 94.3 fL (ref 78.0–100.0)
Platelets: 176 10*3/uL (ref 150–400)
RBC: 5.75 MIL/uL (ref 4.22–5.81)
RDW: 14.4 % (ref 11.5–15.5)
WBC: 6.5 10*3/uL (ref 4.0–10.5)

## 2014-11-02 MED ORDER — PANTOPRAZOLE SODIUM 40 MG PO TBEC
40.0000 mg | DELAYED_RELEASE_TABLET | Freq: Every day | ORAL | Status: DC
  Administered 2014-11-02: 40 mg via ORAL
  Filled 2014-11-02: qty 1

## 2014-11-02 MED ORDER — METOPROLOL TARTRATE 1 MG/ML IV SOLN
5.0000 mg | Freq: Four times a day (QID) | INTRAVENOUS | Status: DC
  Administered 2014-11-02 (×2): 5 mg via INTRAVENOUS
  Filled 2014-11-02 (×6): qty 5

## 2014-11-02 MED ORDER — ONDANSETRON 4 MG PO TBDP: 4.0000 mg | ORAL_TABLET | Freq: Three times a day (TID) | ORAL | Status: DC | PRN

## 2014-11-02 MED ORDER — ONDANSETRON 4 MG PO TBDP
4.0000 mg | ORAL_TABLET | Freq: Three times a day (TID) | ORAL | Status: DC | PRN
  Filled 2014-11-02: qty 1

## 2014-11-02 MED ORDER — AMLODIPINE BESYLATE 10 MG PO TABS: 10.0000 mg | ORAL_TABLET | Freq: Every day | ORAL | Status: DC

## 2014-11-02 NOTE — Plan of Care (Signed)
Problem: Phase I Progression Outcomes Goal: Pain controlled with appropriate interventions Outcome: Completed/Met Date Met:  11/02/14     

## 2014-11-02 NOTE — Progress Notes (Signed)
New Admission Note Patient arrived via stretcher with nurse Tech. Patient is alert and oriented x4, pain 10/10 in the abdomen (see MAR). Fall prevention safety plan placed at bedside. Patient has been oriented to staff and the floor, bed in the lowest position, call light placed within reach and bed alarm activated.

## 2014-11-02 NOTE — Progress Notes (Signed)
Family Medicine Teaching Service Daily Progress Note Intern Pager: [phone]  Patient name: [name] Medical record number: [id number] Date of birth: 1976-07-09 Age: 38 y.o. Gender: male  Primary Care Provider: Kennith Maes, DO Consultants: None Code Status: Full  Assessment and Plan: [name] is a 38 y.o. male presenting with intractable nausea and vomiting . PMH is significant for DM 2, history of ESRD (patient now status post pancreatic and kidney transplant in June 2014), uncontrolled hypertension, GERD, HLD, gastroparesis, depression  Intractable nausea, vomiting, abdominal pain-  - resolved, PO challenge now - Most likely etiology is gastroparesis, other likely contributed factors are marijuana use, and mild pancreatitis with lipase elevated 104 today - Blood in emesis- possibly explained by frequent n/v - mallory weiss tears - gastroccult, IV PPI, Stepdown if becomes recurrent and is witnessed - Would call GI if recurrent  - NPO, advance diet in the a.m.  - DC IV fluids- PO challenge - Change to PO's- protonix and zofran - DC IV reglan  DM 2, history of ESRD- now status post pancreatic and kidney transplant in June 2014 - A1c was 5.6 on an admission for similar reasons in September of this year  Hypertension - Uncontrolled, given IV metop overnight dc now and restart home meds - restart home Norvasc - PRN hydralazine - Likely needs additional oral med, consider Ace but needs to be monitored closely with his transplanted kidneys  FEN/GI: regular diet and PO challenge Prophylaxis: subcutaneous heparin   FEN/GI: nothing by mouth for tonight IV NS at 125 mL/hr Prophylaxis: subcutaneous heparin  Disposition: Likely DC home today if PO challenge goes well,   Subjective:  Feels better, States he would like to try some water by mouth and would like some food. Abd pain resolved.   Objective: Temp:  [98.2 F (36.8 C)-99.1 F  (37.3 C)] 98.2 F (36.8 C) (11/08 0800) Pulse Rate:  [83-122] 87 (11/08 0800) Resp:  [16-24] 21 (11/08 0800) BP: (139-236)/(72-183) 155/89 mmHg (11/08 0800) SpO2:  [90 %-100 %] 100 % (11/08 0800) Weight:  [144 lb 12.8 oz (65.681 kg)-155 lb (70.308 kg)] 144 lb 12.8 oz (65.681 kg) (11/07 2149) Physical Exam: Gen: NAD, somnolent but easily arousable and conversational HEENT: NCAT, his membranes tachy CV: RRR, good S1/S2, no murmur Resp: CTABL, no wheezes, non-labored Abd: positive BS, SNTND Ext: bilateral BKA, healed ulcer on left medial stump Neuro: Alert and oriented, No gross deficits  Laboratory:  Recent Labs Lab 11/01/14 1315 11/02/14 0650  WBC 4.8 6.5  HGB 18.4* 17.7*  HCT 56.2* 54.2*  PLT 145* 176    Recent Labs Lab 11/01/14 1455  NA 140  K 4.7  CL 105  CO2 22  BUN 12  CREATININE 1.11  CALCIUM 9.0  PROT 7.3  BILITOT 0.4  ALKPHOS 144*  ALT 17  AST 15  GLUCOSE 109*   lipase105 Troponin 0.00  Urine drug screen from 09/08/2014 positive for opiates and positive for THC    [name], MD 11/02/2014, 10:58 AM PGY-3, [place] Intern pager: [phone], text pages welcome

## 2014-11-02 NOTE — Plan of Care (Signed)
Problem: Phase I Progression Outcomes Goal: Voiding-avoid urinary catheter unless indicated Outcome: Completed/Met Date Met:  11/02/14

## 2014-11-02 NOTE — Progress Notes (Signed)
Blood pressure equals 200/133, notified MD on call for telemetry order. 5mg  metoprolol ordered as well. Patient placed on tele, CCMD notified. Blood pressure after metoprolol equals 139/72. Will continue to monitor.

## 2014-11-02 NOTE — Discharge Instructions (Signed)
I have sent a prescription for zofran to your pharmacy Be sure to follow up with your regular doctor as soon as possible.   Nausea and Vomiting Nausea is a sick feeling that often comes before throwing up (vomiting). Vomiting is a reflex where stomach contents come out of your mouth. Vomiting can cause severe loss of body fluids (dehydration). Children and elderly adults can become dehydrated quickly, especially if they also have diarrhea. Nausea and vomiting are symptoms of a condition or disease. It is important to find the cause of your symptoms. CAUSES   Direct irritation of the stomach lining. This irritation can result from increased acid production (gastroesophageal reflux disease), infection, food poisoning, taking certain medicines (such as nonsteroidal anti-inflammatory drugs), alcohol use, or tobacco use.  Signals from the brain.These signals could be caused by a headache, heat exposure, an inner ear disturbance, increased pressure in the brain from injury, infection, a tumor, or a concussion, pain, emotional stimulus, or metabolic problems.  An obstruction in the gastrointestinal tract (bowel obstruction).  Illnesses such as diabetes, hepatitis, gallbladder problems, appendicitis, kidney problems, cancer, sepsis, atypical symptoms of a heart attack, or eating disorders.  Medical treatments such as chemotherapy and radiation.  Receiving medicine that makes you sleep (general anesthetic) during surgery. DIAGNOSIS Your caregiver may ask for tests to be done if the problems do not improve after a few days. Tests may also be done if symptoms are severe or if the reason for the nausea and vomiting is not clear. Tests may include:  Urine tests.  Blood tests.  Stool tests.  Cultures (to look for evidence of infection).  X-rays or other imaging studies. Test results can help your caregiver make decisions about treatment or the need for additional tests. TREATMENT You need to stay  well hydrated. Drink frequently but in small amounts.You may wish to drink water, sports drinks, clear broth, or eat frozen ice pops or gelatin dessert to help stay hydrated.When you eat, eating slowly may help prevent nausea.There are also some antinausea medicines that may help prevent nausea. HOME CARE INSTRUCTIONS   Take all medicine as directed by your caregiver.  If you do not have an appetite, do not force yourself to eat. However, you must continue to drink fluids.  If you have an appetite, eat a normal diet unless your caregiver tells you differently.  Eat a variety of complex carbohydrates (rice, wheat, potatoes, bread), lean meats, yogurt, fruits, and vegetables.  Avoid high-fat foods because they are more difficult to digest.  Drink enough water and fluids to keep your urine clear or pale yellow.  If you are dehydrated, ask your caregiver for specific rehydration instructions. Signs of dehydration may include:  Severe thirst.  Dry lips and mouth.  Dizziness.  Dark urine.  Decreasing urine frequency and amount.  Confusion.  Rapid breathing or pulse. SEEK IMMEDIATE MEDICAL CARE IF:   You have blood or brown flecks (like coffee grounds) in your vomit.  You have black or bloody stools.  You have a severe headache or stiff neck.  You are confused.  You have severe abdominal pain.  You have chest pain or trouble breathing.  You do not urinate at least once every 8 hours.  You develop cold or clammy skin.  You continue to vomit for longer than 24 to 48 hours.  You have a fever. MAKE SURE YOU:   Understand these instructions.  Will watch your condition.  Will get help right away if you are not  doing well or get worse. Document Released: 12/12/2005 Document Revised: 03/05/2012 Document Reviewed: 05/11/2011 [facility] Patient Information 2015 [place], [place]. This information is not intended to replace advice given to you by your health care provider.  Make sure you discuss any questions you have with your health care provider.

## 2014-11-02 NOTE — Discharge Summary (Signed)
[facility] Discharge Summary  Patient name: [name] Medical record number: [id number] Date of birth: 16-Feb-1976 Age: 38 y.o. Gender: male Date of Admission: 11/01/2014  Date of Discharge: 11/02/2014. Admitting Physician: Dickie La, MD  Primary Care Provider: Kennith Maes, DO  Indication for Hospitalization: intractable nausea and vomiting Discharge Diagnoses:  1. Intractable nausea and vomiting 2. DM 2 3. Hypertension  Consultations: none  Significant Labs and Imaging:   Recent Labs Lab 11/01/14 1455  NA 140  K 4.7  CL 105  CO2 22  GLUCOSE 109*  BUN 12  CREATININE 1.11  CALCIUM 9.0    Recent Labs Lab 11/01/14 1315 11/02/14 0650  HGB 18.4* 17.7*  HCT 56.2* 54.2*  WBC 4.8 6.5  PLT 145* 176     Procedures: None  Brief Hospital Course:  [name] is a 38 y.o. male presenting with intractable nausea and vomiting . 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 Abdominal pain started after he began heaving repeatedly. He was admitted and given IV fluids, we started IV Protonix, IV Reglan, and IV Zofran. He improved and felt well enough to try a by mouth challenge which he tolerated easily in the morning. He was supported with only oral Zofran and discharged with close follow-up with his PCP.  History of diabetes type 2 and ESRD. He has a history of these conditions which have been completely resolved after red kidney and increased transplant in June 2014. On his last admission his A1c was 5.6 and his creatinine is normal today.  All of his anti rejection meds were continued during admission and on discharge from the hospital as well as his prophylactic Bactrim.  Hypertension As  uncontrolled throughout admission and managed with IV metoprolol and IV hydralazine. I feel that it was partially uncontrolled due to by mouth intolerance of his medications and repeated retching. He was restarted on Norvasc prior to discharge.   Discharge Medications:    Medication List    TAKE these medications        amLODipine 10 MG tablet  Commonly known as:  NORVASC  Take 1 tablet (10 mg total) by mouth daily.     aspirin EC 81 MG tablet  Take 81 mg by mouth daily.     HYDROcodone-acetaminophen 5-325 MG per tablet  Commonly known as:  NORCO/VICODIN  Take 1 tablet by mouth every 4 (four) hours as needed for moderate pain or severe pain.     MYFORTIC 180 MG EC tablet  Generic drug:  mycophenolate  Take 720 mg by mouth 2 (two) times daily.     omeprazole 20 MG capsule  Commonly known as:  PRILOSEC  Take 1 capsule (20 mg total) by mouth daily as needed (heartburn).     ondansetron 4 MG disintegrating tablet  Commonly known as:  ZOFRAN ODT  Take 1 tablet (4 mg total) by mouth every 8 (eight) hours as needed for nausea or vomiting.     predniSONE 5 MG tablet  Commonly known as:  DELTASONE  Take 5 mg by mouth daily.     sulfamethoxazole-trimethoprim 400-80 MG per tablet  Commonly known as:  BACTRIM,SEPTRA  Take 1 tablet by mouth every Monday, Wednesday, and Friday.     tacrolimus 1 MG  capsule  Commonly known as:  PROGRAF  Take 3 mg by mouth 2 (two) times daily.       Issues for Follow Up:  - follow-up blood pressures as they were severely elevated during admission, this is felt to be due to his acute nausea and vomiting and by mouth intolerance of hypertension meds.  Outstanding Results: none  Discharge Instructions: Please refer to Patient Instructions section of EMR for full details.  Patient was counseled important signs and symptoms that should prompt return to medical care, changes in medications, dietary instructions, activity restrictions, and follow up  appointments.  Significant instructions noted below:      Follow-up Information    Follow up with Kennith Maes, DO In 2 days.   Specialty:  Family Medicine   Contact information:   [place] [address] [phone]       Discharge Condition: stable  Kenn File, MD 11/02/2014, 11:36 AM

## 2014-11-02 NOTE — Progress Notes (Signed)
UR completed 

## 2014-12-18 ENCOUNTER — Emergency Department (HOSPITAL_COMMUNITY)
Admission: EM | Admit: 2014-12-18 | Discharge: 2014-12-18 | Disposition: A | Discharge status: Home / Self Care | Payer: Medicare Other | Attending: Emergency Medicine | Admitting: Emergency Medicine

## 2014-12-18 ENCOUNTER — Encounter (HOSPITAL_COMMUNITY): Payer: Self-pay | Admitting: Emergency Medicine

## 2014-12-18 DIAGNOSIS — Z79899 Other long term (current) drug therapy: Secondary | ICD-10-CM | POA: Insufficient documentation

## 2014-12-18 DIAGNOSIS — K920 Hematemesis: Secondary | ICD-10-CM | POA: Diagnosis present

## 2014-12-18 DIAGNOSIS — K219 Gastro-esophageal reflux disease without esophagitis: Secondary | ICD-10-CM | POA: Insufficient documentation

## 2014-12-18 DIAGNOSIS — G8929 Other chronic pain: Secondary | ICD-10-CM | POA: Insufficient documentation

## 2014-12-18 DIAGNOSIS — I150 Renovascular hypertension: Secondary | ICD-10-CM

## 2014-12-18 DIAGNOSIS — Z7952 Long term (current) use of systemic steroids: Secondary | ICD-10-CM | POA: Diagnosis not present

## 2014-12-18 DIAGNOSIS — Z7982 Long term (current) use of aspirin: Secondary | ICD-10-CM | POA: Diagnosis not present

## 2014-12-18 DIAGNOSIS — M1990 Unspecified osteoarthritis, unspecified site: Secondary | ICD-10-CM | POA: Insufficient documentation

## 2014-12-18 DIAGNOSIS — Z992 Dependence on renal dialysis: Secondary | ICD-10-CM | POA: Diagnosis not present

## 2014-12-18 DIAGNOSIS — Z792 Long term (current) use of antibiotics: Secondary | ICD-10-CM | POA: Diagnosis not present

## 2014-12-18 DIAGNOSIS — Z8659 Personal history of other mental and behavioral disorders: Secondary | ICD-10-CM | POA: Diagnosis not present

## 2014-12-18 LAB — COMPREHENSIVE METABOLIC PANEL
ALBUMIN: 4 g/dL (ref 3.5–5.2)
ALT: 17 U/L (ref 0–53)
AST: 19 U/L (ref 0–37)
Alkaline Phosphatase: 144 U/L — ABNORMAL HIGH (ref 39–117)
Anion gap: 13 (ref 5–15)
BUN: 11 mg/dL (ref 6–23)
CALCIUM: 9.7 mg/dL (ref 8.4–10.5)
CO2: 23 mmol/L (ref 19–32)
Chloride: 102 mEq/L (ref 96–112)
Creatinine, Ser: 1.59 mg/dL — ABNORMAL HIGH (ref 0.50–1.35)
GFR calc Af Amer: 61 mL/min — ABNORMAL LOW (ref >90)
GFR calc non Af Amer: 53 mL/min — ABNORMAL LOW (ref >90)
Glucose, Bld: 120 mg/dL — ABNORMAL HIGH (ref 70–99)
Potassium: 4.5 mmol/L (ref 3.5–5.1)
Sodium: 138 mmol/L (ref 135–145)
TOTAL PROTEIN: 7.8 g/dL (ref 6.0–8.3)
Total Bilirubin: 1 mg/dL (ref 0.3–1.2)

## 2014-12-18 LAB — CBC WITH DIFFERENTIAL/PLATELET
Basophils Absolute: 0 10*3/uL (ref 0.0–0.1)
Basophils Relative: 0 % (ref 0–1)
EOS ABS: 0.1 10*3/uL (ref 0.0–0.7)
Eosinophils Relative: 1 % (ref 0–5)
HCT: 58.9 % — ABNORMAL HIGH (ref 39.0–52.0)
Hemoglobin: 19.2 g/dL — ABNORMAL HIGH (ref 13.0–17.0)
Lymphocytes Relative: 10 % — ABNORMAL LOW (ref 12–46)
Lymphs Abs: 0.5 10*3/uL — ABNORMAL LOW (ref 0.7–4.0)
MCH: 31.1 pg (ref 26.0–34.0)
MCHC: 32.6 g/dL (ref 30.0–36.0)
MCV: 95.5 fL (ref 78.0–100.0)
Monocytes Absolute: 0.6 10*3/uL (ref 0.1–1.0)
Monocytes Relative: 12 % (ref 3–12)
Neutro Abs: 4 10*3/uL (ref 1.7–7.7)
Neutrophils Relative %: 76 % (ref 43–77)
Platelets: 198 10*3/uL (ref 150–400)
RBC: 6.17 MIL/uL — AB (ref 4.22–5.81)
RDW: 14.2 % (ref 11.5–15.5)
WBC: 5.3 10*3/uL (ref 4.0–10.5)

## 2014-12-18 LAB — I-STAT CHEM 8, ED
BUN: 15 mg/dL (ref 6–23)
Calcium, Ion: 1.04 mmol/L — ABNORMAL LOW (ref 1.12–1.23)
Chloride: 104 mEq/L (ref 96–112)
Creatinine, Ser: 1.3 mg/dL (ref 0.50–1.35)
Glucose, Bld: 124 mg/dL — ABNORMAL HIGH (ref 70–99)
HCT: 64 % — ABNORMAL HIGH (ref 39.0–52.0)
HEMOGLOBIN: 21.8 g/dL — AB (ref 13.0–17.0)
POTASSIUM: 4.6 mmol/L (ref 3.5–5.1)
Sodium: 140 mmol/L (ref 135–145)
TCO2: 26 mmol/L (ref 0–100)

## 2014-12-18 LAB — I-STAT CG4 LACTIC ACID, ED: Lactic Acid, Venous: 2.84 mmol/L — ABNORMAL HIGH (ref 0.5–2.2)

## 2014-12-18 LAB — TYPE AND SCREEN
ABO/RH(D): O POS
ANTIBODY SCREEN: NEGATIVE

## 2014-12-18 LAB — LIPASE, BLOOD: LIPASE: 28 U/L (ref 11–59)

## 2014-12-18 MED ORDER — LABETALOL HCL 5 MG/ML IV SOLN
10.0000 mg | Freq: Once | INTRAVENOUS | Status: AC
  Administered 2014-12-18: 10 mg via INTRAVENOUS
  Filled 2014-12-18: qty 4

## 2014-12-18 MED ORDER — SODIUM CHLORIDE 0.9 % IV SOLN: 1000.0000 mL | Freq: Once | INTRAVENOUS | Status: DC

## 2014-12-18 MED ORDER — PROMETHAZINE HCL 25 MG/ML IJ SOLN
25.0000 mg | Freq: Once | INTRAMUSCULAR | Status: AC
  Administered 2014-12-18: 25 mg via INTRAVENOUS
  Filled 2014-12-18: qty 1

## 2014-12-18 MED ORDER — SODIUM CHLORIDE 0.9 % IV BOLUS (SEPSIS)
1000.0000 mL | Freq: Once | INTRAVENOUS | Status: AC
Start: 2014-12-18 — End: 2014-12-18
  Administered 2014-12-18: 1000 mL via INTRAVENOUS

## 2014-12-18 MED ORDER — SODIUM CHLORIDE 0.9 % IV SOLN: 1000.0000 mL | INTRAVENOUS | Status: DC

## 2014-12-18 MED ORDER — PANTOPRAZOLE SODIUM 40 MG IV SOLR: 40.0000 mg | Freq: Two times a day (BID) | INTRAVENOUS | Status: DC

## 2014-12-18 MED ORDER — AMLODIPINE BESYLATE 5 MG PO TABS
10.0000 mg | ORAL_TABLET | Freq: Once | ORAL | Status: AC
  Administered 2014-12-18: 10 mg via ORAL
  Filled 2014-12-18: qty 2

## 2014-12-18 MED ORDER — ONDANSETRON 4 MG PO TBDP: 4.0000 mg | ORAL_TABLET | Freq: Three times a day (TID) | ORAL | Status: DC | PRN

## 2014-12-18 MED ORDER — SODIUM CHLORIDE 0.9 % IV SOLN
8.0000 mg/h | INTRAVENOUS | Status: DC
  Administered 2014-12-18: 8 mg/h via INTRAVENOUS
  Filled 2014-12-18 (×3): qty 80

## 2014-12-18 MED ORDER — SODIUM CHLORIDE 0.9 % IV SOLN
1000.0000 mL | Freq: Once | INTRAVENOUS | Status: AC
  Administered 2014-12-18: 1000 mL via INTRAVENOUS

## 2014-12-18 MED ORDER — ONDANSETRON HCL 4 MG/2ML IJ SOLN
4.0000 mg | Freq: Once | INTRAMUSCULAR | Status: AC
  Administered 2014-12-18: 4 mg via INTRAVENOUS
  Filled 2014-12-18: qty 2

## 2014-12-18 MED ORDER — SODIUM CHLORIDE 0.9 % IV SOLN
80.0000 mg | Freq: Once | INTRAVENOUS | Status: AC
  Administered 2014-12-18: 80 mg via INTRAVENOUS
  Filled 2014-12-18: qty 80

## 2014-12-18 NOTE — ED Notes (Signed)
Pt tolerating fluids well. Denies needs

## 2014-12-18 NOTE — ED Provider Notes (Signed)
CSN: [id number]     Arrival date & time 12/18/14  0018 History   First MD Initiated Contact with Patient 12/18/14 0021     Chief Complaint  Patient presents with  . Hematemesis     (Consider location/radiation/quality/duration/timing/severity/associated sxs/prior Treatment) HPI Comments: Patient with complicated medical history including DM, gastroparesis, Kidney/Pancreas transplant  2014, bilateral BKA, Mallory Weis tear, presents tonight with N/V bright red blood  Started about 2 hours PTA and has filled up a "bucket" at home and partially filled a hospital emesis bag.  States having diffuse upper abdominal pain Took Zofran PTA without relief  The history is provided by the patient and the EMS personnel.    Past Medical History  Diagnosis Date  . Hypertension   . GERD (gastroesophageal reflux disease)   . Gastropathy   . Chronic pain   . Depression   . Edema   . Headache(784.0)   . 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 Dialysis patient 04/18/12    "[facility]; [place], [place], [place]"  . Blood transfusion   . Arthritis     "I think I do; just in my fingers & my hands"  . Type I diabetes mellitus     "juvenile"  . Gastroparesis   . ESRD (end stage renal disease) on dialysis    Past Surgical History  Procedure Laterality Date  . Below knee leg amputation  "it's been awhile"    bilaterally  . Cataract extraction  ~ 2011    right  . Av fistula placement  08/2011    left upper arm  . Combined kidney-pancreas transplant     Family History  Problem Relation Age of Onset  . Diabetes Other   . Hypertension Other    History  Substance Use Topics  . Smoking status: Never Smoker   . Smokeless tobacco: Never Used  . Alcohol Use: No    Review of Systems  Constitutional: Negative for fever.  Respiratory: Negative for shortness of breath.   Cardiovascular: Negative for chest pain.  Gastrointestinal: Positive for nausea and abdominal pain. Negative for  abdominal distention.  Neurological: Negative for dizziness, weakness and headaches.  All other systems reviewed and are negative.     Allergies  Review of patient's allergies indicates no known allergies.  Home Medications   Prior to Admission medications   Medication Sig Start Date End Date Taking? Authorizing Provider  aspirin EC 81 MG tablet Take 81 mg by mouth daily.   Yes Historical Provider, MD  mycophenolate (MYFORTIC) 180 MG EC tablet Take 720 mg by mouth 2 (two) times daily.  07/06/13  Yes Historical Provider, MD  omeprazole (PRILOSEC) 20 MG capsule Take 1 capsule (20 mg total) by mouth daily as needed (heartburn). 06/06/14  Yes Kalman Drape, MD  ondansetron (ZOFRAN ODT) 4 MG disintegrating tablet Take 1 tablet (4 mg total) by mouth every 8 (eight) hours as needed for nausea or vomiting. 11/02/14  Yes Bernadene Bell, MD  predniSONE (DELTASONE) 5 MG tablet Take 5 mg by mouth daily.  06/20/13  Yes Historical Provider, MD  sulfamethoxazole-trimethoprim (BACTRIM,SEPTRA) 400-80 MG per tablet Take 1 tablet by mouth every Monday, Wednesday, and Friday.    Yes Historical Provider, MD  tacrolimus (PROGRAF) 1 MG capsule Take 3 mg by mouth 2 (two) times daily.  07/16/13  Yes Historical Provider, MD  amLODipine (NORVASC) 10 MG tablet Take 1 tablet (10 mg total) by mouth daily. 11/02/14   [name],  MD  HYDROcodone-acetaminophen (NORCO/VICODIN) 5-325 MG per tablet Take 1 tablet by mouth every 4 (four) hours as needed for moderate pain or severe pain. 09/07/14   Tammy Sours, MD   BP 204/111 mmHg  Pulse 95  Temp(Src) 98.1 F (36.7 C) (Oral)  Resp 16  SpO2 98% Physical Exam  Constitutional: He is oriented to person, place, and time. He appears well-developed and well-nourished.  Eyes: Pupils are equal, round, and reactive to light.  Neck: Normal range of motion.  Abdominal: Soft. He exhibits no distension. There is generalized tenderness.  Musculoskeletal: Normal range of motion.   Bilateral BKA  Neurological: He is alert and oriented to person, place, and time.  Skin: Skin is warm. No rash noted.  Nursing note and vitals reviewed.   ED Course  Procedures (including critical care time) Labs Review Labs Reviewed  COMPREHENSIVE METABOLIC PANEL - Abnormal; Notable for the following:    Glucose, Bld 120 (*)    Creatinine, Ser 1.59 (*)    Alkaline Phosphatase 144 (*)    GFR calc non Af Amer 53 (*)    GFR calc Af Amer 61 (*)    All other components within normal limits  CBC WITH DIFFERENTIAL - Abnormal; Notable for the following:    RBC 6.17 (*)    Hemoglobin 19.2 (*)    HCT 58.9 (*)    Lymphocytes Relative 10 (*)    Lymphs Abs 0.5 (*)    All other components within normal limits  I-STAT CG4 LACTIC ACID, ED - Abnormal; Notable for the following:    Lactic Acid, Venous 2.84 (*)    All other components within normal limits  I-STAT CHEM 8, ED - Abnormal; Notable for the following:    Glucose, Bld 124 (*)    Calcium, Ion 1.04 (*)    Hemoglobin 21.8 (*)    HCT 64.0 (*)    All other components within normal limits  LIPASE, BLOOD  PROTIME-INR  TYPE AND SCREEN    Imaging Review No results found.   EKG Interpretation None      MDM  Patient's labs have been checked.  He has been given IV fluids.  He is more comfortable.  He is tolerating fluid challenge.  He has persistent hypertension despite the use of his normal 10 mg Norvasc.  I will give him 10 mg of labetalol IV and reassess Final diagnoses:  None         Garald Balding, NP 12/18/14 0600  Kalman Drape, MD 12/18/14 [phone]

## 2014-12-18 NOTE — Discharge Instructions (Signed)
Hematemesis °This condition is the vomiting of blood. °CAUSES  °This can happen if you have a peptic ulcer or an irritation of the throat, stomach, or small bowel. Vomiting over and over again or swallowing blood from a nosebleed, coughing or facial injury can also result in bloody vomit. Anti-inflammatory pain medicines are a common cause of this potentially dangerous condition. The most serious causes of vomiting blood include: °· Ulcers (a bacteria called H. pylori is common cause of ulcers). °· Clotting problems. °· Alcoholism. °· Cirrhosis. °TREATMENT  °Treatment depends on the cause and the severity of the bleeding. Small amounts of blood streaks in the vomit is not the same as vomiting large amounts of bloody or dark, coffee grounds-like material. Weakness, fainting, dehydration, anemia, and continued alcohol or drug use increase the risk. Examination may include blood, vomit, or stool tests. The presence of bloody or dark stool that tests positive for blood (Hemoccult) means the bleeding has been going on for some time. Endoscopy and imaging studies may be done. Emergency treatment may include: °· IV medicines or fluids. °· Blood transfusions. °· Surgery. °Hospital care is required for high risk patients or when IV fluids or blood is needed. Upper GI bleeding can cause shock and death if not controlled. °HOME CARE INSTRUCTIONS  °· Your treatment does not require hospital care at this time. °· Remain at rest until your condition improves. °· Drink clear liquids as tolerated. °· Avoid: °¨ Alcohol. °¨ Nicotine. °¨ Aspirin. °¨ Any other anti-inflammatory medicine (ibuprofen, naproxen, and many others). °· Medications to suppress stomach acid or vomiting may be needed. Take all your medicine as prescribed. °· Be sure to see your caregiver for follow-up as recommended. °SEEK IMMEDIATE MEDICAL CARE IF:  °· You have repeated vomiting, dehydration, fainting, or extreme weakness. °· You are vomiting large amounts of  bloody or dark material. °· You pass large, dark or bloody stools. °Document Released: 01/19/2005 Document Revised: 03/05/2012 Document Reviewed: 02/04/2009 °ExitCare® Patient Information ©2015 [employer]. This information is not intended to replace advice given to you by your health care provider. Make sure you discuss any questions you have with your health care provider. ° °

## 2014-12-18 NOTE — ED Notes (Signed)
Notified MD and RN of elevated i-stat and i-stat Chem8

## 2014-12-18 NOTE — ED Provider Notes (Signed)
  Physical Exam  BP 194/110 mmHg  Pulse 97  Temp(Src) 97.3 F (36.3 C) (Oral)  Resp 18  SpO2 97%  Physical Exam  ED Course  Procedures  MDM Pt blood pressure is down and pt is tolerating po. Pt sent home with return precuations      Glendell Docker, NP 12/18/14 [place], MD 12/18/14 1247

## 2014-12-18 NOTE — ED Notes (Signed)
Patient here from home via EMS with complaint of GI bleeding which began around 2130. EMS reported that patient had approximately 1/2 gallon of bright red emesis in trash can at home. Recent history of the same with admission.

## 2015-01-07 ENCOUNTER — Emergency Department (HOSPITAL_COMMUNITY)
Admission: EM | Admit: 2015-01-07 | Discharge: 2015-01-08 | Disposition: A | Discharge status: Home / Self Care | Payer: Medicare Other | Attending: Emergency Medicine | Admitting: Emergency Medicine

## 2015-01-07 ENCOUNTER — Encounter (HOSPITAL_COMMUNITY): Payer: Self-pay | Admitting: *Deleted

## 2015-01-07 ENCOUNTER — Emergency Department (HOSPITAL_COMMUNITY): Payer: Medicare Other

## 2015-01-07 DIAGNOSIS — E109 Type 1 diabetes mellitus without complications: Secondary | ICD-10-CM | POA: Diagnosis not present

## 2015-01-07 DIAGNOSIS — Y998 Other external cause status: Secondary | ICD-10-CM | POA: Diagnosis not present

## 2015-01-07 DIAGNOSIS — Z9483 Pancreas transplant status: Secondary | ICD-10-CM | POA: Diagnosis not present

## 2015-01-07 DIAGNOSIS — Z79899 Other long term (current) drug therapy: Secondary | ICD-10-CM | POA: Insufficient documentation

## 2015-01-07 DIAGNOSIS — Z89511 Acquired absence of right leg below knee: Secondary | ICD-10-CM | POA: Insufficient documentation

## 2015-01-07 DIAGNOSIS — Z89512 Acquired absence of left leg below knee: Secondary | ICD-10-CM | POA: Insufficient documentation

## 2015-01-07 DIAGNOSIS — Y92410 Unspecified street and highway as the place of occurrence of the external cause: Secondary | ICD-10-CM | POA: Diagnosis not present

## 2015-01-07 DIAGNOSIS — I120 Hypertensive chronic kidney disease with stage 5 chronic kidney disease or end stage renal disease: Secondary | ICD-10-CM | POA: Diagnosis not present

## 2015-01-07 DIAGNOSIS — M1990 Unspecified osteoarthritis, unspecified site: Secondary | ICD-10-CM | POA: Diagnosis not present

## 2015-01-07 DIAGNOSIS — K219 Gastro-esophageal reflux disease without esophagitis: Secondary | ICD-10-CM | POA: Diagnosis not present

## 2015-01-07 DIAGNOSIS — N186 End stage renal disease: Secondary | ICD-10-CM | POA: Insufficient documentation

## 2015-01-07 DIAGNOSIS — Z7982 Long term (current) use of aspirin: Secondary | ICD-10-CM | POA: Diagnosis not present

## 2015-01-07 DIAGNOSIS — Z940 Kidney transplant status: Secondary | ICD-10-CM | POA: Diagnosis not present

## 2015-01-07 DIAGNOSIS — S3991XA Unspecified injury of abdomen, initial encounter: Secondary | ICD-10-CM | POA: Diagnosis not present

## 2015-01-07 DIAGNOSIS — Y9389 Activity, other specified: Secondary | ICD-10-CM | POA: Insufficient documentation

## 2015-01-07 DIAGNOSIS — G8929 Other chronic pain: Secondary | ICD-10-CM | POA: Diagnosis not present

## 2015-01-07 DIAGNOSIS — Z8659 Personal history of other mental and behavioral disorders: Secondary | ICD-10-CM | POA: Insufficient documentation

## 2015-01-07 DIAGNOSIS — R1031 Right lower quadrant pain: Secondary | ICD-10-CM

## 2015-01-07 LAB — CBC WITH DIFFERENTIAL/PLATELET
Basophils Absolute: 0 10*3/uL (ref 0.0–0.1)
Basophils Relative: 1 % (ref 0–1)
Eosinophils Absolute: 0.1 10*3/uL (ref 0.0–0.7)
Eosinophils Relative: 2 % (ref 0–5)
HEMATOCRIT: 52.5 % — AB (ref 39.0–52.0)
Hemoglobin: 17.9 g/dL — ABNORMAL HIGH (ref 13.0–17.0)
LYMPHS PCT: 13 % (ref 12–46)
Lymphs Abs: 0.7 10*3/uL (ref 0.7–4.0)
MCH: 32.7 pg (ref 26.0–34.0)
MCHC: 34.1 g/dL (ref 30.0–36.0)
MCV: 96 fL (ref 78.0–100.0)
MONO ABS: 0.8 10*3/uL (ref 0.1–1.0)
MONOS PCT: 16 % — AB (ref 3–12)
NEUTROS ABS: 3.4 10*3/uL (ref 1.7–7.7)
Neutrophils Relative %: 68 % (ref 43–77)
Platelets: 169 10*3/uL (ref 150–400)
RBC: 5.47 MIL/uL (ref 4.22–5.81)
RDW: 14.2 % (ref 11.5–15.5)
WBC: 5 10*3/uL (ref 4.0–10.5)

## 2015-01-07 MED ORDER — HYDROMORPHONE HCL 1 MG/ML IJ SOLN
1.0000 mg | Freq: Once | INTRAMUSCULAR | Status: AC
  Administered 2015-01-07: 1 mg via INTRAVENOUS
  Filled 2015-01-07: qty 1

## 2015-01-07 MED ORDER — SODIUM CHLORIDE 0.9 % IV BOLUS (SEPSIS)
1000.0000 mL | Freq: Once | INTRAVENOUS | Status: AC
  Administered 2015-01-07: 1000 mL via INTRAVENOUS
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
## 2015-01-07 NOTE — ED Notes (Signed)
Phlebotomy at bedside.

## 2015-01-07 NOTE — ED Notes (Signed)
Patient transported to CT and X ray 

## 2015-01-07 NOTE — ED Notes (Signed)
Doctor at bedside.
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
## 2015-01-08 ENCOUNTER — Encounter (HOSPITAL_COMMUNITY): Payer: Self-pay | Admitting: Radiology

## 2015-01-08 ENCOUNTER — Emergency Department (HOSPITAL_COMMUNITY): Payer: Medicare Other

## 2015-01-08 DIAGNOSIS — S3991XA Unspecified injury of abdomen, initial encounter: Secondary | ICD-10-CM | POA: Diagnosis not present

## 2015-01-08 LAB — COMPREHENSIVE METABOLIC PANEL
ALT: 18 U/L (ref 0–53)
ANION GAP: 18 — AB (ref 5–15)
AST: 18 U/L (ref 0–37)
Albumin: 3.7 g/dL (ref 3.5–5.2)
Alkaline Phosphatase: 123 U/L — ABNORMAL HIGH (ref 39–117)
BUN: 10 mg/dL (ref 6–23)
CO2: 19 mmol/L (ref 19–32)
Calcium: 9.5 mg/dL (ref 8.4–10.5)
Chloride: 104 mEq/L (ref 96–112)
Creatinine, Ser: 1.4 mg/dL — ABNORMAL HIGH (ref 0.50–1.35)
GFR calc non Af Amer: 62 mL/min — ABNORMAL LOW (ref >90)
GFR, EST AFRICAN AMERICAN: 71 mL/min — AB (ref >90)
Glucose, Bld: 115 mg/dL — ABNORMAL HIGH (ref 70–99)
POTASSIUM: 4.2 mmol/L (ref 3.5–5.1)
Sodium: 141 mmol/L (ref 135–145)
Total Bilirubin: 0.8 mg/dL (ref 0.3–1.2)
Total Protein: 7.1 g/dL (ref 6.0–8.3)

## 2015-01-08 LAB — LACTIC ACID, PLASMA: Lactic Acid, Venous: 0.8 mmol/L (ref 0.5–2.2)

## 2015-01-08 LAB — TYPE AND SCREEN
ABO/RH(D): O POS
ANTIBODY SCREEN: NEGATIVE

## 2015-01-08 LAB — LIPASE, BLOOD: LIPASE: 33 U/L (ref 11–59)

## 2015-01-08 MED ORDER — CYCLOBENZAPRINE HCL 10 MG PO TABS: 10.0000 mg | ORAL_TABLET | Freq: Two times a day (BID) | ORAL | Status: DC | PRN

## 2015-01-08 MED ORDER — HYDROCODONE-ACETAMINOPHEN 5-325 MG PO TABS: 2.0000 | ORAL_TABLET | ORAL | Status: DC | PRN

## 2015-01-08 MED ORDER — HYDROMORPHONE HCL 1 MG/ML IJ SOLN
1.0000 mg | Freq: Once | INTRAMUSCULAR | Status: AC
Start: 2015-01-08 — End: 2015-01-08
  Administered 2015-01-08: 1 mg via INTRAVENOUS
  Filled 2015-01-08: qty 1

## 2015-01-08 MED ORDER — TACROLIMUS 1 MG PO CAPS
3.0000 mg | ORAL_CAPSULE | Freq: Two times a day (BID) | ORAL | Status: DC
  Filled 2015-01-08: qty 3

## 2015-01-08 MED ORDER — SODIUM CHLORIDE 0.9 % IV BOLUS (SEPSIS)
1000.0000 mL | Freq: Once | INTRAVENOUS | Status: AC
  Administered 2015-01-08: 1000 mL via INTRAVENOUS

## 2015-01-08 MED ORDER — ONDANSETRON 4 MG PO TBDP: 4.0000 mg | ORAL_TABLET | Freq: Three times a day (TID) | ORAL | Status: DC | PRN

## 2015-01-08 MED ORDER — ONDANSETRON HCL 4 MG/2ML IJ SOLN
4.0000 mg | Freq: Once | INTRAMUSCULAR | Status: AC
  Administered 2015-01-08: 4 mg via INTRAVENOUS
  Filled 2015-01-08: qty 2

## 2015-01-08 MED ORDER — IOHEXOL 300 MG/ML  SOLN
100.0000 mL | Freq: Once | INTRAMUSCULAR | Status: AC | PRN
  Administered 2015-01-08: 80 mL via INTRAVENOUS

## 2015-01-08 MED ORDER — MYCOPHENOLATE SODIUM 180 MG PO TBEC
720.0000 mg | DELAYED_RELEASE_TABLET | Freq: Two times a day (BID) | ORAL | Status: DC
  Filled 2015-01-08: qty 4

## 2015-01-08 NOTE — Discharge Instructions (Signed)

## 2015-01-08 NOTE — ED Notes (Signed)
Pt. Left with all belongings 

## 2015-02-20 ENCOUNTER — Ambulatory Visit (INDEPENDENT_AMBULATORY_CARE_PROVIDER_SITE_OTHER): Payer: Medicare Other | Admitting: Family Medicine

## 2015-02-20 ENCOUNTER — Encounter: Payer: Self-pay | Admitting: Family Medicine

## 2015-02-20 VITALS — BP 120/78 | Temp 98.1°F | Ht 73.0 in | Wt 164.2 lb

## 2015-02-20 DIAGNOSIS — Z114 Encounter for screening for human immunodeficiency virus [HIV]: Secondary | ICD-10-CM

## 2015-02-20 DIAGNOSIS — Z940 Kidney transplant status: Secondary | ICD-10-CM

## 2015-02-20 DIAGNOSIS — D849 Immunodeficiency, unspecified: Secondary | ICD-10-CM

## 2015-02-20 DIAGNOSIS — D899 Disorder involving the immune mechanism, unspecified: Secondary | ICD-10-CM

## 2015-02-20 DIAGNOSIS — Z8639 Personal history of other endocrine, nutritional and metabolic disease: Secondary | ICD-10-CM

## 2015-02-20 DIAGNOSIS — I10 Essential (primary) hypertension: Secondary | ICD-10-CM

## 2015-02-20 DIAGNOSIS — D702 Other drug-induced agranulocytosis: Secondary | ICD-10-CM

## 2015-02-20 NOTE — Addendum Note (Signed)
Addended by: Nolon Rod on: 02/20/2015 04:22 PM   Modules accepted: Orders

## 2015-02-20 NOTE — Assessment & Plan Note (Signed)
Doing well s/p transplant.  - Check A1C, HIV, and Lipid Panel today

## 2015-02-20 NOTE — Assessment & Plan Note (Signed)
Doing well s/p pancreatic/renal transplant - On immunosuppressive meds.  Denies new Sx or affects

## 2015-02-20 NOTE — Progress Notes (Signed)
Reviewed

## 2015-02-20 NOTE — Assessment & Plan Note (Signed)
On lisinopril 10 mg qd, BP well controlled - Labs per renal, appreciate recs - F/U in 6-12 months

## 2015-02-20 NOTE — Progress Notes (Signed)
[name] is a 39 y.o. male who presents today pancreatic/renal transplant at [facility], Hx of DM, Hx of HTN.   HTN- On lisinopril 10 mg qd, denies ADR, compliant with meds.   Hx of DM - Previous Hx of DM II, s/p pancreatic transplant, doing well w/o complications.    Immunocompromised - Pt with hx of pancreatic and kidney tx done about two yr ago at [facility], now on Prograf and other immunosuppressant medications.  Doing well, f/u with renal at [facility] by Dr. Moshe Cipro.  No new complications.   Past Medical History  Diagnosis Date  . Hypertension   . GERD (gastroesophageal reflux disease)   . Gastropathy   . Chronic pain   . Depression   . Edema   . Headache(784.0)   . 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 Dialysis patient 04/18/12    "[facility]; [place], [place], [place]"  . Blood transfusion   . Arthritis     "I think I do; just in my fingers & my hands"  . Type I diabetes mellitus     "juvenile"  . Gastroparesis   . ESRD (end stage renal disease) on dialysis     History  Smoking status  . Never Smoker   Smokeless tobacco  . Never Used    Family History  Problem Relation Age of Onset  . Diabetes Other   . Hypertension Other     Current Outpatient Prescriptions on File Prior to Visit  Medication Sig Dispense Refill  . aspirin EC 81 MG tablet Take 81 mg by mouth daily.    . cyclobenzaprine (FLEXERIL) 10 MG tablet Take 1 tablet (10 mg total) by mouth 2 (two) times daily as needed for muscle spasms. 10 tablet 0  . HYDROcodone-acetaminophen (NORCO/VICODIN) 5-325 MG per tablet Take 2 tablets by mouth every 4 (four) hours as needed for moderate pain or severe pain. 8 tablet 0  . mycophenolate (MYFORTIC) 180 MG EC tablet Take 720 mg by mouth 2 (two) times daily.     [name] omeprazole (PRILOSEC) 20 MG capsule Take 1 capsule (20 mg total) by mouth daily as needed (heartburn). 30 capsule 0  . ondansetron (ZOFRAN ODT) 4 MG disintegrating tablet Take 1 tablet  (4 mg total) by mouth every 8 (eight) hours as needed for nausea or vomiting. 20 tablet 0  . predniSONE (DELTASONE) 5 MG tablet Take 5 mg by mouth daily.     [name] sulfamethoxazole-trimethoprim (BACTRIM,SEPTRA) 400-80 MG per tablet Take 1 tablet by mouth every Monday, Wednesday, and Friday.     . tacrolimus (PROGRAF) 1 MG capsule Take 3 mg by mouth 2 (two) times daily.      No current facility-administered medications on file prior to visit.    ROS: Per HPI.  All other systems reviewed and are negative.   Physical Exam Filed Vitals:   02/20/15 1603  BP: 120/78  Temp: 98.1 F (36.7 C)    Physical Examination: General appearance - alert, well appearing, and in no distress Neurological - No focal findings B/L  Extremities - BKA B/L     Chemistry      Component Value Date/Time   NA 141 01/07/2015 2304   K 4.2 01/07/2015 2304   CL 104 01/07/2015 2304   CO2 19 01/07/2015 2304   BUN 10 01/07/2015 2304   CREATININE 1.40* 01/07/2015 2304   CREATININE 1.20 05/30/2014 1415      Component Value Date/Time   CALCIUM 9.5 01/07/2015 2304  CALCIUM 8.4 09/08/2011 0746   ALKPHOS 123* 01/07/2015 2304   AST 18 01/07/2015 2304   ALT 18 01/07/2015 2304   BILITOT 0.8 01/07/2015 2304      Lab Results  Component Value Date   WBC 5.0 01/07/2015   HGB 17.9* 01/07/2015   HCT 52.5* 01/07/2015   MCV 96.0 01/07/2015   PLT 169 01/07/2015   Lab Results  Component Value Date   TSH 0.867 03/06/2008   Lab Results  Component Value Date   HGBA1C 5.6 09/09/2014

## 2015-02-23 ENCOUNTER — Other Ambulatory Visit: Payer: Medicare Other

## 2015-04-16 ENCOUNTER — Other Ambulatory Visit (HOSPITAL_COMMUNITY): Payer: Self-pay | Admitting: *Deleted

## 2015-04-17 ENCOUNTER — Ambulatory Visit (HOSPITAL_COMMUNITY)
Admission: RE | Admit: 2015-04-17 | Discharge: 2015-04-17 | Disposition: A | Discharge status: Home / Self Care | Payer: Medicare Other | Source: Ambulatory Visit | Attending: Nephrology | Admitting: Nephrology

## 2015-04-17 ENCOUNTER — Other Ambulatory Visit (HOSPITAL_COMMUNITY): Payer: Self-pay | Admitting: Nephrology

## 2015-04-17 DIAGNOSIS — D751 Secondary polycythemia: Secondary | ICD-10-CM

## 2015-04-17 DIAGNOSIS — Z9483 Pancreas transplant status: Secondary | ICD-10-CM | POA: Insufficient documentation

## 2015-04-17 DIAGNOSIS — Z940 Kidney transplant status: Secondary | ICD-10-CM | POA: Diagnosis not present

## 2015-04-17 NOTE — Progress Notes (Signed)
Pt states he feels better now and is able to go.  VSS

## 2015-04-17 NOTE — Progress Notes (Signed)
PT came in today for 250cc phlebotomy.  I stuck the pt's left AC and the blood flow was very slow but steady.  Around 100cc's out the patient started to close his eyes and I noticed he was breathing more rapidly then when we started.  I asked him if he was okay, if he felt dizzy and he stated he was ok.  I continued the phlebotomy and at 190cc out he appeared as if he was going to pass out so I stopped the phlebotomy and had him drink a sprite.  The patient did not pass out fully but stated he was not feeling well. He had not eaten breakfast today.  His girlfriend went and got him a muffin. I called and reported the above to [place] at [facility] but had to leave a voicemail.  Will continue to monitor the patient.

## 2015-07-03 ENCOUNTER — Encounter (HOSPITAL_COMMUNITY): Payer: Self-pay | Admitting: Emergency Medicine

## 2015-07-03 ENCOUNTER — Emergency Department (HOSPITAL_COMMUNITY): Payer: Medicare Other

## 2015-07-03 ENCOUNTER — Observation Stay (HOSPITAL_COMMUNITY)
Admission: EM | Admit: 2015-07-03 | Discharge: 2015-07-04 | Disposition: A | Discharge status: Home / Self Care | Payer: Medicare Other | Attending: Family Medicine | Admitting: Family Medicine

## 2015-07-03 DIAGNOSIS — I120 Hypertensive chronic kidney disease with stage 5 chronic kidney disease or end stage renal disease: Secondary | ICD-10-CM | POA: Insufficient documentation

## 2015-07-03 DIAGNOSIS — Z7982 Long term (current) use of aspirin: Secondary | ICD-10-CM | POA: Insufficient documentation

## 2015-07-03 DIAGNOSIS — G8929 Other chronic pain: Secondary | ICD-10-CM | POA: Insufficient documentation

## 2015-07-03 DIAGNOSIS — Z79899 Other long term (current) drug therapy: Secondary | ICD-10-CM | POA: Insufficient documentation

## 2015-07-03 DIAGNOSIS — R1033 Periumbilical pain: Principal | ICD-10-CM | POA: Insufficient documentation

## 2015-07-03 DIAGNOSIS — Z792 Long term (current) use of antibiotics: Secondary | ICD-10-CM | POA: Diagnosis not present

## 2015-07-03 DIAGNOSIS — Z7952 Long term (current) use of systemic steroids: Secondary | ICD-10-CM | POA: Insufficient documentation

## 2015-07-03 DIAGNOSIS — K319 Disease of stomach and duodenum, unspecified: Secondary | ICD-10-CM | POA: Insufficient documentation

## 2015-07-03 DIAGNOSIS — Z992 Dependence on renal dialysis: Secondary | ICD-10-CM | POA: Diagnosis not present

## 2015-07-03 DIAGNOSIS — R630 Anorexia: Secondary | ICD-10-CM | POA: Diagnosis not present

## 2015-07-03 DIAGNOSIS — R1110 Vomiting, unspecified: Secondary | ICD-10-CM

## 2015-07-03 DIAGNOSIS — R112 Nausea with vomiting, unspecified: Secondary | ICD-10-CM | POA: Diagnosis not present

## 2015-07-03 DIAGNOSIS — E109 Type 1 diabetes mellitus without complications: Secondary | ICD-10-CM | POA: Diagnosis not present

## 2015-07-03 DIAGNOSIS — F329 Major depressive disorder, single episode, unspecified: Secondary | ICD-10-CM | POA: Insufficient documentation

## 2015-07-03 DIAGNOSIS — K219 Gastro-esophageal reflux disease without esophagitis: Secondary | ICD-10-CM | POA: Diagnosis not present

## 2015-07-03 DIAGNOSIS — N186 End stage renal disease: Secondary | ICD-10-CM | POA: Insufficient documentation

## 2015-07-03 DIAGNOSIS — M1990 Unspecified osteoarthritis, unspecified site: Secondary | ICD-10-CM | POA: Diagnosis not present

## 2015-07-03 DIAGNOSIS — E509 Vitamin A deficiency, unspecified: Secondary | ICD-10-CM | POA: Diagnosis not present

## 2015-07-03 DIAGNOSIS — K3184 Gastroparesis: Secondary | ICD-10-CM | POA: Insufficient documentation

## 2015-07-03 DIAGNOSIS — R109 Unspecified abdominal pain: Secondary | ICD-10-CM | POA: Diagnosis present

## 2015-07-03 LAB — CBC WITH DIFFERENTIAL/PLATELET
Basophils Absolute: 0 10*3/uL (ref 0.0–0.1)
Basophils Relative: 0 % (ref 0–1)
EOS PCT: 1 % (ref 0–5)
Eosinophils Absolute: 0.1 10*3/uL (ref 0.0–0.7)
HEMATOCRIT: 58 % — AB (ref 39.0–52.0)
HEMOGLOBIN: 19 g/dL — AB (ref 13.0–17.0)
LYMPHS ABS: 0.5 10*3/uL — AB (ref 0.7–4.0)
Lymphocytes Relative: 4 % — ABNORMAL LOW (ref 12–46)
MCH: 30.9 pg (ref 26.0–34.0)
MCHC: 32.8 g/dL (ref 30.0–36.0)
MCV: 94.5 fL (ref 78.0–100.0)
Monocytes Absolute: 1.3 10*3/uL — ABNORMAL HIGH (ref 0.1–1.0)
Monocytes Relative: 11 % (ref 3–12)
NEUTROS ABS: 9.8 10*3/uL — AB (ref 1.7–7.7)
NEUTROS PCT: 84 % — AB (ref 43–77)
Platelets: 94 10*3/uL — ABNORMAL LOW (ref 150–400)
RBC: 6.14 MIL/uL — ABNORMAL HIGH (ref 4.22–5.81)
RDW: 14.4 % (ref 11.5–15.5)
WBC: 11.7 10*3/uL — ABNORMAL HIGH (ref 4.0–10.5)

## 2015-07-03 LAB — URINALYSIS, ROUTINE W REFLEX MICROSCOPIC
Bilirubin Urine: NEGATIVE
GLUCOSE, UA: NEGATIVE mg/dL
HGB URINE DIPSTICK: NEGATIVE
Ketones, ur: NEGATIVE mg/dL
Leukocytes, UA: NEGATIVE
Nitrite: NEGATIVE
PH: 5.5 (ref 5.0–8.0)
Protein, ur: NEGATIVE mg/dL
Specific Gravity, Urine: 1.017 (ref 1.005–1.030)
Urobilinogen, UA: 0.2 mg/dL (ref 0.0–1.0)

## 2015-07-03 LAB — COMPREHENSIVE METABOLIC PANEL
ALT: 18 U/L (ref 17–63)
AST: 19 U/L (ref 15–41)
Albumin: 3.3 g/dL — ABNORMAL LOW (ref 3.5–5.0)
Alkaline Phosphatase: 91 U/L (ref 38–126)
Anion gap: 9 (ref 5–15)
BUN: 11 mg/dL (ref 6–20)
CALCIUM: 8.6 mg/dL — AB (ref 8.9–10.3)
CO2: 25 mmol/L (ref 22–32)
Chloride: 100 mmol/L — ABNORMAL LOW (ref 101–111)
Creatinine, Ser: 1.43 mg/dL — ABNORMAL HIGH (ref 0.61–1.24)
GFR calc non Af Amer: >60 mL/min (ref >60)
Glucose, Bld: 121 mg/dL — ABNORMAL HIGH (ref 65–99)
Potassium: 3.9 mmol/L (ref 3.5–5.1)
SODIUM: 134 mmol/L — AB (ref 135–145)
Total Bilirubin: 0.6 mg/dL (ref 0.3–1.2)
Total Protein: 6.7 g/dL (ref 6.5–8.1)

## 2015-07-03 LAB — CBG MONITORING, ED
GLUCOSE-CAPILLARY: 129 mg/dL — AB (ref 65–99)
GLUCOSE-CAPILLARY: 141 mg/dL — AB (ref 65–99)

## 2015-07-03 LAB — LIPASE, BLOOD: Lipase: 18 U/L — ABNORMAL LOW (ref 22–51)

## 2015-07-03 MED ORDER — PROMETHAZINE HCL 25 MG/ML IJ SOLN
25.0000 mg | Freq: Once | INTRAMUSCULAR | Status: AC
  Administered 2015-07-03: 25 mg via INTRAVENOUS
  Filled 2015-07-03: qty 1

## 2015-07-03 MED ORDER — ACETAMINOPHEN 325 MG PO TABS
650.0000 mg | ORAL_TABLET | Freq: Once | ORAL | Status: AC
  Administered 2015-07-03: 650 mg via ORAL
  Filled 2015-07-03: qty 2

## 2015-07-03 MED ORDER — MORPHINE SULFATE 4 MG/ML IJ SOLN
4.0000 mg | Freq: Once | INTRAMUSCULAR | Status: AC
  Administered 2015-07-03: 4 mg via INTRAVENOUS
  Filled 2015-07-03: qty 1

## 2015-07-03 MED ORDER — PANTOPRAZOLE SODIUM 40 MG IV SOLR
40.0000 mg | Freq: Once | INTRAVENOUS | Status: AC
  Administered 2015-07-03: 40 mg via INTRAVENOUS
  Filled 2015-07-03: qty 40

## 2015-07-03 MED ORDER — SODIUM CHLORIDE 0.9 % IV BOLUS (SEPSIS)
1000.0000 mL | Freq: Once | INTRAVENOUS | Status: AC
Start: 2015-07-03 — End: 2015-07-04
  Administered 2015-07-03: 1000 mL via INTRAVENOUS

## 2015-07-03 MED ORDER — SODIUM CHLORIDE 0.9 % IV BOLUS (SEPSIS)
1000.0000 mL | Freq: Once | INTRAVENOUS | Status: AC
  Administered 2015-07-03: 1000 mL via INTRAVENOUS

## 2015-07-03 MED ORDER — ONDANSETRON 4 MG PO TBDP
ORAL_TABLET | ORAL | Status: AC
  Administered 2015-07-03: 8 mg via ORAL
  Filled 2015-07-03: qty 2

## 2015-07-03 MED ORDER — ONDANSETRON 4 MG PO TBDP
8.0000 mg | ORAL_TABLET | Freq: Once | ORAL | Status: AC
  Administered 2015-07-03: 8 mg via ORAL

## 2015-07-03 NOTE — ED Notes (Signed)
Pt states that he still very anxious unable to drink nothing now. MD to be notified.

## 2015-07-03 NOTE — ED Notes (Signed)
CBG - 141 ° °

## 2015-07-03 NOTE — ED Notes (Signed)
This RN went to assess pt, pt noted to be diaphoretic, pt gown and sheets wet from sweat. Pt states he has been sweating like this since 0230 when symptoms started. Rectal temp taken and noted. Dr. Tawnya Crook notified.

## 2015-07-03 NOTE — ED Provider Notes (Signed)
CSN: [id number]     Arrival date & time 07/03/15  1517 History   First MD Initiated Contact with Patient 07/03/15 1648     Chief Complaint  Patient presents with  . Vomiting  . Abdominal Pain     (Consider location/radiation/quality/duration/timing/severity/associated sxs/prior Treatment) Patient is a 39 y.o. male presenting with abdominal pain. The history is provided by the patient. No language interpreter was used.  Abdominal Pain Pain location:  Periumbilical Pain quality: aching   Pain radiates to:  Does not radiate Pain severity:  Moderate Onset quality:  Sudden Duration:  14 hours Timing:  Intermittent Progression:  Waxing and waning Chronicity:  Recurrent Context: retching   Relieved by:  Nothing Worsened by:  Nothing tried Ineffective treatments: zofran, reglan. Associated symptoms: anorexia, chills, fatigue, fever, nausea and vomiting   Associated symptoms: no chest pain, no constipation, no cough, no diarrhea, no dysuria and no shortness of breath   Associated symptoms comment:  Abdominal pain, dark emesis   Past Medical History  Diagnosis Date  . Hypertension   . GERD (gastroesophageal reflux disease)   . Gastropathy   . Chronic pain   . Depression   . Edema   . Headache(784.0)   . 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 Dialysis patient 04/18/12    "[facility]; [place], [place], [place]"  . Blood transfusion   . Arthritis     "I think I do; just in my fingers & my hands"  . Type I diabetes mellitus     "juvenile"  . Gastroparesis   . ESRD (end stage renal disease) on dialysis    Past Surgical History  Procedure Laterality Date  . Below knee leg amputation  "it's been awhile"    bilaterally  . Cataract extraction  ~ 2011    right  . Av fistula placement  08/2011    left upper arm  . Combined kidney-pancreas transplant     Family History  Problem Relation Age of Onset  . Diabetes Other   . Hypertension Other    History  Substance  Use Topics  . Smoking status: Never Smoker   . Smokeless tobacco: Never Used  . Alcohol Use: No    Review of Systems  Constitutional: Positive for fever, chills and fatigue. Negative for activity change and appetite change.  HENT: Negative for congestion, facial swelling, rhinorrhea and trouble swallowing.   Eyes: Negative for photophobia and pain.  Respiratory: Negative for cough, chest tightness and shortness of breath.   Cardiovascular: Negative for chest pain and leg swelling.  Gastrointestinal: Positive for nausea, vomiting, abdominal pain and anorexia. Negative for diarrhea and constipation.  Endocrine: Negative for polydipsia and polyuria.  Genitourinary: Negative for dysuria, urgency, decreased urine volume and difficulty urinating.  Musculoskeletal: Negative for back pain and gait problem.  Skin: Negative for color change, rash and wound.  Allergic/Immunologic: Negative for immunocompromised state.  Neurological: Negative for dizziness, facial asymmetry, speech difficulty, weakness, numbness and headaches.  Psychiatric/Behavioral: Negative for confusion, decreased concentration and agitation.      Allergies  Review of patient's allergies indicates no known allergies.  Home Medications   Prior to Admission medications   Medication Sig Start Date End Date Taking? Authorizing Provider  aspirin EC 81 MG tablet Take 81 mg by mouth daily.   Yes Historical Provider, MD  cyclobenzaprine (FLEXERIL) 10 MG tablet Take 1 tablet (10 mg total) by mouth 2 (two) times daily as needed for muscle spasms. 01/08/15  Yes  Gareth Morgan, MD  mycophenolate (MYFORTIC) 180 MG EC tablet Take 720 mg by mouth 2 (two) times daily.  07/06/13  Yes Historical Provider, MD  omeprazole (PRILOSEC) 20 MG capsule Take 1 capsule (20 mg total) by mouth daily as needed (heartburn). 06/06/14  Yes Linton Flemings, MD  ondansetron (ZOFRAN ODT) 4 MG disintegrating tablet Take 1 tablet (4 mg total) by mouth every 8 (eight)  hours as needed for nausea or vomiting. 01/08/15  Yes Gareth Morgan, MD  predniSONE (DELTASONE) 5 MG tablet Take 5 mg by mouth daily.  06/20/13  Yes Historical Provider, MD  sulfamethoxazole-trimethoprim (BACTRIM,SEPTRA) 400-80 MG per tablet Take 1 tablet by mouth every Monday, Wednesday, and Friday.    Yes Historical Provider, MD  tacrolimus (PROGRAF) 1 MG capsule Take 3 mg by mouth 2 (two) times daily.  07/16/13  Yes Historical Provider, MD   BP 151/94 mmHg  Pulse 89  Temp(Src) 98.1 F (36.7 C) (Oral)  Resp 18  Ht 6\' 1"  (1.854 m)  Wt 153 lb 9.6 oz (69.673 kg)  BMI 20.27 kg/m2  SpO2 100% Physical Exam  Constitutional: He is oriented to person, place, and time. He appears well-developed and well-nourished. No distress.  HENT:  Head: Normocephalic and atraumatic.  Mouth/Throat: No oropharyngeal exudate.  Eyes: Pupils are equal, round, and reactive to light.  Neck: Normal range of motion. Neck supple.  Cardiovascular: Normal rate, regular rhythm and normal heart sounds.  Exam reveals no gallop and no friction rub.   No murmur heard. Pulmonary/Chest: Effort normal and breath sounds normal. No respiratory distress. He has no wheezes. He has no rales.  Abdominal: Soft. Bowel sounds are normal. He exhibits no distension and no mass. There is no tenderness. There is no rebound and no guarding.  Musculoskeletal: Normal range of motion. He exhibits no edema or tenderness.  Neurological: He is alert and oriented to person, place, and time.  Skin: Skin is warm and dry.  Psychiatric: He has a normal mood and affect.    ED Course  Procedures (including critical care time) Labs Review Labs Reviewed  CBC WITH DIFFERENTIAL/PLATELET - Abnormal; Notable for the following:    WBC 11.7 (*)    RBC 6.14 (*)    Hemoglobin 19.0 (*)    HCT 58.0 (*)    Platelets 94 (*)    Neutrophils Relative % 84 (*)    Lymphocytes Relative 4 (*)    Neutro Abs 9.8 (*)    Lymphs Abs 0.5 (*)    Monocytes Absolute  1.3 (*)    All other components within normal limits  LIPASE, BLOOD - Abnormal; Notable for the following:    Lipase 18 (*)    All other components within normal limits  COMPREHENSIVE METABOLIC PANEL - Abnormal; Notable for the following:    Sodium 134 (*)    Chloride 100 (*)    Glucose, Bld 121 (*)    Creatinine, Ser 1.43 (*)    Calcium 8.6 (*)    Albumin 3.3 (*)    All other components within normal limits  CBG MONITORING, ED - Abnormal; Notable for the following:    Glucose-Capillary 141 (*)    All other components within normal limits  CBG MONITORING, ED - Abnormal; Notable for the following:    Glucose-Capillary 129 (*)    All other components within normal limits  URINE CULTURE  URINALYSIS, ROUTINE W REFLEX MICROSCOPIC (NOT AT [facility])  TACROLIMUS LEVEL  CBC  CREATININE, SERUM    Imaging Review  Dg Chest 2 View  07/03/2015   CLINICAL DATA:  Patient with fever, shortness of breath, nausea and vomiting.  EXAM: CHEST  2 VIEW  COMPARISON:  Chest radiograph 04/17/2015  FINDINGS: Stable cardiac and mediastinal contours. No consolidative pulmonary opacities. No pleural effusion or pneumothorax. No aggressive or acute appearing osseous lesions.  IMPRESSION: No active cardiopulmonary disease.   Electronically Signed   By: Lovey Newcomer M.D.   On: 07/03/2015 20:14     EKG Interpretation None      MDM   Final diagnoses:  Periumbilical abdominal pain  Intractable vomiting with nausea, vomiting of unspecified type    Pt is a 39 y.o. male with Pmhx as above who presents with TNTC episodes of vomiting since approximately 3 AM yesterday.  He is also had some periumbilical dull constant abdominal pain, subjective fevers and chills.  Wife states that he had 4 or 5 episodes today of dark-appearing emesis.  Had some shortness of breath which started yesterday.  He's had normal bowel movements, no dark, tarry or bloody appearing stools.  On physical exam, patient is a low-grade temp and mild  tachycardia is also hypertensive.  He has low abdominal tenderness without rebound or guarding.  Cardiopulmonary exam is benign.  He is refusing a rectal temperature.  He reports having similar symptoms several months ago and improved after IV fluid hydration.    physical feeling somewhat improved after IV fluids and Tylenol, although still having nausea refuses to attempt by mouth intake.  Creatinine is mildly elevated.  He appears hemoconcentrated.  Spoke with family medicine will admit for observation.        Ernestina Patches, MD 07/04/15 [phone]

## 2015-07-03 NOTE — ED Notes (Addendum)
Pt with hx of pancreas and kidney transplant; pt c/o N/V and abd pain; pt with bilateral BKA; per family coffee ground emesis

## 2015-07-03 NOTE — ED Notes (Signed)
Requested UA sample from PT, PT unable to provide at this time.Andrew KitchenMarland Caldwell

## 2015-07-03 NOTE — H&P (Signed)
[facility] Admission History and Physical Service Pager: [phone]  Patient name: [name] Medical record number: [id number] Date of birth: 15-Mar-1976 Age: 39 y.o. Gender: male  Primary Care Provider: Cordelia Poche, MD Consultants: None Code Status: Full (discussed on admission)  Chief Complaint: Intractable N/V  Assessment and Plan: [name] is a 39 y.o. male presenting with intractable nausea and vomiting. PMH is significant for DM 1(now resolved s/p pancreatic transplant), history of ESRD (now status post kidney transplant in June 2014), HTN, GERD, HLD, gastroparesis, depression, and bilateral amputations.  Intractable nausea, vomiting, abdominal pain: Pt with recurrent history and multiple admissions for same problem. Most likely etiology is gastroparesis. Coffee-ground emesis possibly explained by frequent n/v causing mallory weiss tears.  - Admit to MedSurg under Dr. Mingo Amber - Gastroccult ordered - Would call GI if recurrent for possible EGD - NPO, advance diet in the a.m. - IV fluids - IV PPI - IV Phenergen  H/o T1DM and ESRD: now status post pancreatic and kidney transplant in June 2014. S/p bilateral AKAs. Follows with [facility]. - Continue home immunosuppressive medications when able to tolerate PO  Hypertension: Currently elevated but most likely 2/2 illness and pain; previously well controlled as outpatient and patient was taken off BP meds by nephrologist.  - Monitor BPs - PRN hydralazine  FEN/GI: nothing by mouth for tonight IV NS at 100 mL/hr Prophylaxis: subcutaneous heparin   Disposition: Admit to FPTS  History of Present Illness: [name] is a 39 y.o. male presenting with intractable nausea and vomiting. PMH  is significant for DM 1(now resolved s/p pancreatic transplant), history of ESRD (now status post kidney transplant in June 2014), HTN, GERD, HLD, gastroparesis, depression, and  bilateral amputations. Patient states that symtpoms started around 2:30am on 7/8. He says he was woken up out of sleep because he started feeling nauseous. He then proceeded to have emesis. He describes the emeis as "coffee-ground" and "black". States that he took his zofran and reglan without relief of symtpoms. He says he vomitted so much that he almost filled up is small bedside trash can. Patient also endorsing abdominal pain that he says is from all the dry heaving and vomiting. He is unable to keep any fluids or drink down. He denies fevers, diarrhea, sick contacts, new meds or foods, SOB, and CP. Is endorsing hot/cold flashes that started with illness. Patient has had these types of episodes before that resolved with hospitalization involving IV hydration and medications. Of note patient states he has had an EGD before that he says was "normal".   Review Of Systems: Per HPI.  Patient Active Problem List   Diagnosis Date Noted  . History of diabetes mellitus 02/20/2015  . Abdominal pain 09/10/2014  . Intractable nausea and vomiting 09/09/2014  . Stage II pressure ulcer 07/29/2014  . Weakness of left upper extremity 07/03/2014  . Impaired mobility and activities of daily living 07/03/2014  . Sternoclavicular joint pain 03/04/2014  . History of transplantation, renal 07/19/2013  . History of pancreas transplant 07/19/2013  . Immunosuppressed status 06/27/2013  . H/O pancreas transplant 06/27/2013  . Diabetic gastroparesis associated with type 1 diabetes mellitus 05/08/2013  . BKA stump complication 123456  . S/P bilateral BKA (below knee amputation) 08/17/2012  . Diabetic gastroparesis 07/20/2011  . Metabolic bone disease 99991111  . ESRD (end stage renal disease) 11/17/2009  . GERD 03/14/2007  . HYPERLIPIDEMIA 02/22/2007  . DEPRESSION, MAJOR, RECURRENT 02/22/2007  . POST  TRAUMATIC STRESS DISORDER 02/22/2007  . HYPERTENSION, BENIGN SYSTEMIC 02/22/2007  . IMPOTENCE, ORGANIC  02/22/2007   Past Medical History: Past Medical History  Diagnosis Date  . Hypertension   . GERD (gastroesophageal reflux disease)   . Gastropathy   . Chronic pain   . Depression   . Edema   . Headache(784.0)   . 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 Dialysis patient 04/18/12    "[facility]; [place], [place], [place]"  . Blood transfusion   . Arthritis     "I think I do; just in my fingers & my hands"  . Type I diabetes mellitus     "juvenile"  . Gastroparesis   . ESRD (end stage renal disease) on dialysis    Past Surgical History: Past Surgical History  Procedure Laterality Date  . Below knee leg amputation  "it's been awhile"    bilaterally  . Cataract extraction  ~ 2011    right  . Av fistula placement  08/2011    left upper arm  . Combined kidney-pancreas transplant     Social History: History  Substance Use Topics  . Smoking status: Never Smoker   . Smokeless tobacco: Never Used  . Alcohol Use: No   Please also refer to relevant sections of EMR.  Family History: Family History  Problem Relation Age of Onset  . Diabetes Other   . Hypertension Other    Allergies and Medications: No Known Allergies No current facility-administered medications on file prior to encounter.   Current Outpatient Prescriptions on File Prior to Encounter  Medication Sig Dispense Refill  . aspirin EC 81 MG tablet Take 81 mg by mouth daily.    . cyclobenzaprine (FLEXERIL) 10 MG tablet Take 1 tablet (10 mg total) by mouth 2 (two) times daily as needed for muscle spasms. 10 tablet 0  . mycophenolate (MYFORTIC) 180 MG EC tablet Take 720 mg by mouth 2 (two) times daily.     [name] omeprazole (PRILOSEC) 20 MG capsule Take 1 capsule (20 mg total) by mouth daily as needed (heartburn). 30 capsule 0  . ondansetron (ZOFRAN ODT) 4 MG disintegrating tablet Take 1 tablet (4 mg total) by mouth every 8 (eight) hours as needed for nausea or vomiting. 20 tablet 0  . predniSONE (DELTASONE) 5  MG tablet Take 5 mg by mouth daily.     [name] sulfamethoxazole-trimethoprim (BACTRIM,SEPTRA) 400-80 MG per tablet Take 1 tablet by mouth every Monday, Wednesday, and Friday.     . tacrolimus (PROGRAF) 1 MG capsule Take 3 mg by mouth 2 (two) times daily.       Objective: BP 145/81 mmHg  Pulse 79  Temp(Src) 99.9 F (37.7 C) (Rectal)  Resp 18  SpO2 99% Exam: General: alert, well-developed, NAD HEENT: NCAT, vision grossly intact, PERRLA, no injection and anicteric. EOMI. Tacky mucous membranes, oral mucosa and oropharynx reveal no lesions or exudates.  Lungs: CTAB, normal respiratory effort, and no wheezes. Heart: RRR, no M/R/G.  Abdomen: Bowel sounds normal; abdomen soft and nontender. No guarding, no rebound tenderness Extremities: No cyanosis, clubbing, edema. Patient has bilateral AKA Neurologic: No focal deficits appreciated,  A&Ox3.  Skin: Intact without suspicious lesions or rashes. Warm and dry. Psych: Mood and affect are normal.  Labs and Imaging: Results for orders placed or performed during the hospital encounter of 07/03/15 (from the past 24 hour(s))  CBG monitoring, ED     Status: Abnormal   Collection Time: 07/03/15  3:40 PM  Result  Value Ref Range   Glucose-Capillary 129 (H) 65 - 99 mg/dL  CBC with Differential     Status: Abnormal   Collection Time: 07/03/15  3:45 PM  Result Value Ref Range   WBC 11.7 (H) 4.0 - 10.5 K/uL   RBC 6.14 (H) 4.22 - 5.81 MIL/uL   Hemoglobin 19.0 (H) 13.0 - 17.0 g/dL   HCT 58.0 (H) 39.0 - 52.0 %   MCV 94.5 78.0 - 100.0 fL   MCH 30.9 26.0 - 34.0 pg   MCHC 32.8 30.0 - 36.0 g/dL   RDW 14.4 11.5 - 15.5 %   Platelets 94 (L) 150 - 400 K/uL   Neutrophils Relative % 84 (H) 43 - 77 %   Lymphocytes Relative 4 (L) 12 - 46 %   Monocytes Relative 11 3 - 12 %   Eosinophils Relative 1 0 - 5 %   Basophils Relative 0 0 - 1 %   Neutro Abs 9.8 (H) 1.7 - 7.7 K/uL   Lymphs Abs 0.5 (L) 0.7 - 4.0 K/uL   Monocytes Absolute 1.3 (H) 0.1 - 1.0 K/uL    Eosinophils Absolute 0.1 0.0 - 0.7 K/uL   Basophils Absolute 0.0 0.0 - 0.1 K/uL   RBC Morphology POLYCHROMASIA PRESENT    WBC Morphology TOXIC GRANULATION    Smear Review LARGE PLATELETS PRESENT   Lipase, blood     Status: Abnormal   Collection Time: 07/03/15  3:45 PM  Result Value Ref Range   Lipase 18 (L) 22 - 51 U/L  POC CBG, ED     Status: Abnormal   Collection Time: 07/03/15  4:54 PM  Result Value Ref Range   Glucose-Capillary 141 (H) 65 - 99 mg/dL   Comment 1 Notify RN    Comment 2 Document in Chart   Comprehensive metabolic panel     Status: Abnormal   Collection Time: 07/03/15  6:00 PM  Result Value Ref Range   Sodium 134 (L) 135 - 145 mmol/L   Potassium 3.9 3.5 - 5.1 mmol/L   Chloride 100 (L) 101 - 111 mmol/L   CO2 25 22 - 32 mmol/L   Glucose, Bld 121 (H) 65 - 99 mg/dL   BUN 11 6 - 20 mg/dL   Creatinine, Ser 1.43 (H) 0.61 - 1.24 mg/dL   Calcium 8.6 (L) 8.9 - 10.3 mg/dL   Total Protein 6.7 6.5 - 8.1 g/dL   Albumin 3.3 (L) 3.5 - 5.0 g/dL   AST 19 15 - 41 U/L   ALT 18 17 - 63 U/L   Alkaline Phosphatase 91 38 - 126 U/L   Total Bilirubin 0.6 0.3 - 1.2 mg/dL   GFR calc non Af Amer >60 >60 mL/min   GFR calc Af Amer >60 >60 mL/min   Anion gap 9 5 - 15  Urinalysis, Routine w reflex microscopic (not at [facility])     Status: None   Collection Time: 07/03/15  8:57 PM  Result Value Ref Range   Color, Urine YELLOW YELLOW   APPearance CLEAR CLEAR   Specific Gravity, Urine 1.017 1.005 - 1.030   pH 5.5 5.0 - 8.0   Glucose, UA NEGATIVE NEGATIVE mg/dL   Hgb urine dipstick NEGATIVE NEGATIVE   Bilirubin Urine NEGATIVE NEGATIVE   Ketones, ur NEGATIVE NEGATIVE mg/dL   Protein, ur NEGATIVE NEGATIVE mg/dL   Urobilinogen, UA 0.2 0.0 - 1.0 mg/dL   Nitrite NEGATIVE NEGATIVE   Leukocytes, UA NEGATIVE NEGATIVE    Dg Chest 2 View  07/03/2015  CLINICAL DATA:  Patient with fever, shortness of breath, nausea and vomiting.  EXAM: CHEST  2 VIEW  COMPARISON:  Chest radiograph 04/17/2015   FINDINGS: Stable cardiac and mediastinal contours. No consolidative pulmonary opacities. No pleural effusion or pneumothorax. No aggressive or acute appearing osseous lesions.  IMPRESSION: No active cardiopulmonary disease.   Electronically Signed   By: Lovey Newcomer M.D.   On: 07/03/2015 20:14    Katheren Shams, DO 07/03/2015, 11:47 PM PGY-2, [place] Intern pager: [phone], text pages welcome

## 2015-07-03 NOTE — ED Notes (Signed)
Patient transported to X-ray 

## 2015-07-04 DIAGNOSIS — R1033 Periumbilical pain: Secondary | ICD-10-CM | POA: Diagnosis not present

## 2015-07-04 DIAGNOSIS — R112 Nausea with vomiting, unspecified: Secondary | ICD-10-CM | POA: Diagnosis present

## 2015-07-04 LAB — CBC
HEMATOCRIT: 52.8 % — AB (ref 39.0–52.0)
Hemoglobin: 17.4 g/dL — ABNORMAL HIGH (ref 13.0–17.0)
MCH: 31.2 pg (ref 26.0–34.0)
MCHC: 33 g/dL (ref 30.0–36.0)
MCV: 94.8 fL (ref 78.0–100.0)
Platelets: 138 10*3/uL — ABNORMAL LOW (ref 150–400)
RBC: 5.57 MIL/uL (ref 4.22–5.81)
RDW: 14.5 % (ref 11.5–15.5)
WBC: 10 10*3/uL (ref 4.0–10.5)

## 2015-07-04 LAB — CREATININE, SERUM
CREATININE: 1.36 mg/dL — AB (ref 0.61–1.24)
GFR calc Af Amer: >60 mL/min (ref >60)

## 2015-07-04 MED ORDER — PROMETHAZINE HCL 25 MG/ML IJ SOLN: 12.5000 mg | Freq: Four times a day (QID) | INTRAMUSCULAR | Status: DC | PRN

## 2015-07-04 MED ORDER — ASPIRIN EC 81 MG PO TBEC
81.0000 mg | DELAYED_RELEASE_TABLET | Freq: Every day | ORAL | Status: DC
  Administered 2015-07-04: 81 mg via ORAL
  Filled 2015-07-04: qty 1

## 2015-07-04 MED ORDER — HEPARIN SODIUM (PORCINE) 5000 UNIT/ML IJ SOLN: 5000.0000 [IU] | Freq: Three times a day (TID) | INTRAMUSCULAR | Status: DC

## 2015-07-04 MED ORDER — HYDRALAZINE HCL 20 MG/ML IJ SOLN: 5.0000 mg | INTRAMUSCULAR | Status: DC | PRN

## 2015-07-04 MED ORDER — PANTOPRAZOLE SODIUM 40 MG PO TBEC
40.0000 mg | DELAYED_RELEASE_TABLET | Freq: Every day | ORAL | Status: DC
  Administered 2015-07-04: 40 mg via ORAL

## 2015-07-04 MED ORDER — PREDNISONE 5 MG PO TABS
5.0000 mg | ORAL_TABLET | Freq: Every day | ORAL | Status: DC
  Administered 2015-07-04: 5 mg via ORAL
  Filled 2015-07-04 (×2): qty 1

## 2015-07-04 MED ORDER — HEPARIN SODIUM (PORCINE) 5000 UNIT/ML IJ SOLN
5000.0000 [IU] | Freq: Three times a day (TID) | INTRAMUSCULAR | Status: DC
  Filled 2015-07-04 (×3): qty 1

## 2015-07-04 MED ORDER — SODIUM CHLORIDE 0.9 % IV SOLN: INTRAVENOUS | Status: DC

## 2015-07-04 MED ORDER — TACROLIMUS 1 MG PO CAPS
3.0000 mg | ORAL_CAPSULE | Freq: Two times a day (BID) | ORAL | Status: DC
  Administered 2015-07-04: 3 mg via ORAL
  Filled 2015-07-04 (×2): qty 3

## 2015-07-04 MED ORDER — SULFAMETHOXAZOLE-TRIMETHOPRIM 400-80 MG PO TABS: 1.0000 | ORAL_TABLET | ORAL | Status: DC

## 2015-07-04 MED ORDER — SODIUM CHLORIDE 0.9 % IV SOLN
INTRAVENOUS | Status: DC
  Administered 2015-07-04: 01:00:00 via INTRAVENOUS

## 2015-07-04 MED ORDER — MYCOPHENOLATE SODIUM 180 MG PO TBEC
720.0000 mg | DELAYED_RELEASE_TABLET | Freq: Two times a day (BID) | ORAL | Status: DC
  Administered 2015-07-04: 720 mg via ORAL
  Filled 2015-07-04 (×3): qty 4

## 2015-07-04 NOTE — Progress Notes (Signed)
Admission note:  Arrival Method: Pt arrived on bed from ED with nurse tech Mental Orientation: Alert and oriented x 4 Telemetry: N/A Assessment: Completed, see doc flowsheets Skin: Skin dry and intact. Assessed with Maudie Mercury, RN IV: Right hand IV. Clean, dry and intact Pain: Pt states no pain at this time Tubes: Old left upper arm fistula. Pt states it no longer works Safety Measures: Bed in lowest position, call light within reach.  Fall Prevention Safety Plan: Reviewed with patient. Patient is a moderate fall risk Admission Screening: Completed, see doc flowsheets  [phone] Orientation: Patient has been oriented to the unit, staff and to the room. Patient lying in bed with no further needs stated at this time. Call light within reach, will continue to monitor.   Shelbie Hutching, RN, BSN

## 2015-07-04 NOTE — Discharge Summary (Signed)
[facility] Discharge Summary  Patient name: [name] Medical record number: [id number] Date of birth: 02-Jan-1976 Age: 39 y.o. Gender: male Date of Admission: 07/03/2015  Date of Discharge: 07/04/2015 Admitting Physician: Alveda Reasons, MD  Primary Care Provider: Cordelia Poche, MD Consultants: None  Indication for Hospitalization: Intractable N/V  Discharge Diagnoses/Problem List:  Patient Active Problem List   Diagnosis Date Noted  . Non-intractable vomiting with nausea 07/04/2015  . Nausea & vomiting 07/03/2015  . History of diabetes mellitus 02/20/2015  . Abdominal pain 09/10/2014  . Intractable nausea and vomiting 09/09/2014  . Stage II pressure ulcer 07/29/2014  . Weakness of left upper extremity 07/03/2014  . Impaired mobility and activities of daily living 07/03/2014  . Sternoclavicular joint pain 03/04/2014  . History of transplantation, renal 07/19/2013  . History of pancreas transplant 07/19/2013  . Immunosuppressed status 06/27/2013  . H/O pancreas transplant 06/27/2013  . Diabetic gastroparesis associated with type 1 diabetes mellitus 05/08/2013  . BKA stump complication [id number]  . S/P bilateral BKA (below knee amputation) 08/17/2012  . Diabetic gastroparesis 07/20/2011  . Metabolic bone disease 99991111  . ESRD (end stage renal disease) 11/17/2009  . GERD 03/14/2007  . HYPERLIPIDEMIA 02/22/2007  . DEPRESSION, MAJOR, RECURRENT 02/22/2007  . POST TRAUMATIC STRESS DISORDER 02/22/2007  . HYPERTENSION, BENIGN SYSTEMIC 02/22/2007  . IMPOTENCE, 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    Disposition: Home  Discharge Condition: Improved  Discharge Exam:  Filed Vitals:   07/04/15 0413  BP: 156/84  Pulse: 83  Temp: 99 F (37.2 C)  Resp: 17   General: alert, well-developed, NAD.  Lungs: CTAB, normal respiratory effort, and no wheezes. Heart: RRR, no M/R/G.  Abdomen: Bowel sounds normal; soft and nontender. No guarding, no rebound  tenderness Extremities: bilateral AKA Neurologic: No focal deficits appreciated, A&Ox3.  Skin: Intact without suspicious lesions or rashes. Warm and dry.   Brief Hospital Course:  [name] is a 39 y.o. male who presented with intractable nausea and vomiting. PMH is significant for DM 1(now resolved s/p pancreatic transplant), history of ESRD (now status post kidney transplant in June 2014), HTN, GERD, HLD, gastroparesis, depression, and bilateral amputations.  Intractable nausea, vomiting, abdominal pain: Patient with recurrent history and multiple admissions for same problem. Most likely etiology is gastroparesis 2/2 diabetes. Admitted for IV hydration and IV antiemetics. Patient was observed overnight. At time of discharge patient was able to tolerate diet without n/v. Discharged in stable condition.     The patient's other chronic conditions were stable during this hospitalization and the patient was continued on home medications.    Issues for Follow Up:  1. Follow-up on BPs, was discontinued by nephrologist per patient but was noted to have elevated pressures while hospitalized 2. Chronic polycythemia that needs further evaluation   Significant Procedures: None  Significant Labs and Imaging:   Recent Labs Lab 07/03/15 1545 07/04/15 0245  WBC 11.7* 10.0  HGB 19.0* 17.4*  HCT 58.0* 52.8*  PLT 94* 138*    Recent Labs Lab 07/03/15 1800 07/04/15 0245  NA 134*  --   K 3.9  --   CL 100*  --   CO2 25  --   GLUCOSE 121*  --   BUN 11  --   CREATININE 1.43* 1.36*  CALCIUM 8.6*  --   ALKPHOS 91  --   AST 19  --   ALT 18  --   ALBUMIN 3.3*  --    Dg Chest 2 View  07/03/2015   CLINICAL DATA:  Patient with fever, shortness of breath, nausea and vomiting.  EXAM: CHEST  2 VIEW  COMPARISON:  Chest radiograph 04/17/2015  FINDINGS: Stable cardiac and mediastinal contours. No consolidative pulmonary opacities. No pleural effusion or pneumothorax. No aggressive or acute  appearing osseous lesions.  IMPRESSION: No active cardiopulmonary disease.   Electronically Signed   By: Lovey Newcomer M.D.   On: 07/03/2015 20:14   Results/Tests Pending at Time of Discharge: None  Discharge Medications:    Medication List    TAKE these medications        aspirin EC 81 MG tablet  Take 81 mg by mouth daily.     cyclobenzaprine 10 MG tablet  Commonly known as:  FLEXERIL  Take 1 tablet (10 mg total) by mouth 2 (two) times daily as needed for muscle spasms.     MYFORTIC 180 MG EC tablet  Generic drug:  mycophenolate  Take 720 mg by mouth 2 (two) times daily.     omeprazole 20 MG capsule  Commonly known as:  PRILOSEC  Take 1 capsule (20 mg total) by mouth daily as needed (heartburn).     ondansetron 4 MG disintegrating tablet  Commonly known as:  ZOFRAN ODT  Take 1 tablet (4 mg total) by mouth every 8 (eight) hours as needed for nausea or vomiting.     predniSONE 5 MG tablet  Commonly known as:  DELTASONE  Take 5 mg by mouth daily.     sulfamethoxazole-trimethoprim 400-80 MG per tablet  Commonly known as:  BACTRIM,SEPTRA  Take 1 tablet by mouth every Monday, Wednesday, and Friday.     tacrolimus 1 MG capsule  Commonly known as:  PROGRAF  Take 3 mg by mouth 2 (two) times daily.        Discharge Instructions: Please refer to Patient Instructions section of EMR for full details.  Patient was counseled important signs and symptoms that should prompt return to medical care, changes in medications, dietary instructions, activity restrictions, and follow up appointments.   Follow-Up Appointments: Follow-up Information    Follow up with Cordelia Poche, MD. Go on 07/14/2015.   Specialty:  Family Medicine   Why:  @2pm  for hospital follow-up   Contact information:   [place] [address] [phone]        Katheren Shams, DO 07/04/2015, 5:39 PM PGY-2, [place]

## 2015-07-04 NOTE — Progress Notes (Signed)
Pt provided with discharge instruction including information on follow up appointments and new medications. Pt verbalized understanding of all information. Pt IV was dc'd without complication. VSS. Pt escorted out via wheelchair by hospital volunteer.   Tyna Jaksch, RN

## 2015-07-04 NOTE — Discharge Instructions (Signed)

## 2015-07-04 NOTE — Progress Notes (Signed)
Report received from ED RN. RN states patient will be up to the floor shortly.   Shelbie Hutching, RN, BSN

## 2015-07-05 LAB — URINE CULTURE

## 2015-07-06 LAB — TACROLIMUS LEVEL: Tacrolimus (FK506) - LabCorp: 2.5 ng/mL (ref 2.0–20.0)

## 2015-07-14 ENCOUNTER — Inpatient Hospital Stay: Payer: Medicare Other | Admitting: Family Medicine

## 2015-09-04 ENCOUNTER — Encounter (HOSPITAL_COMMUNITY): Payer: Medicare Other

## 2015-09-10 ENCOUNTER — Other Ambulatory Visit (HOSPITAL_COMMUNITY): Payer: Self-pay | Admitting: *Deleted

## 2015-09-11 ENCOUNTER — Ambulatory Visit (HOSPITAL_COMMUNITY)
Admission: RE | Admit: 2015-09-11 | Discharge: 2015-09-11 | Disposition: A | Discharge status: Home / Self Care | Payer: Medicare Other | Source: Ambulatory Visit | Attending: Nephrology | Admitting: Nephrology

## 2015-09-11 DIAGNOSIS — D751 Secondary polycythemia: Secondary | ICD-10-CM | POA: Insufficient documentation

## 2015-09-11 NOTE — Progress Notes (Signed)
Phlebotomized 250cc from pts right AC.  Pt was in a bed and stated he wasn't feeling well once the phlebotomy was finished.  He sipped on his sprite and laid back in bed and stated after 5 minutes he felt better.  His wife was at the bedside.  We rechecked his VS prior to D/C and pt felt back to baseline prior to D/C home.

## 2015-09-12 ENCOUNTER — Emergency Department (HOSPITAL_COMMUNITY)
Admission: EM | Admit: 2015-09-12 | Discharge: 2015-09-13 | Disposition: A | Discharge status: Home / Self Care | Payer: Medicare Other | Attending: Emergency Medicine | Admitting: Emergency Medicine

## 2015-09-12 ENCOUNTER — Encounter (HOSPITAL_COMMUNITY): Payer: Self-pay | Admitting: Emergency Medicine

## 2015-09-12 DIAGNOSIS — M1990 Unspecified osteoarthritis, unspecified site: Secondary | ICD-10-CM | POA: Diagnosis not present

## 2015-09-12 DIAGNOSIS — R112 Nausea with vomiting, unspecified: Secondary | ICD-10-CM | POA: Diagnosis not present

## 2015-09-12 DIAGNOSIS — R079 Chest pain, unspecified: Secondary | ICD-10-CM | POA: Diagnosis not present

## 2015-09-12 DIAGNOSIS — M542 Cervicalgia: Secondary | ICD-10-CM | POA: Insufficient documentation

## 2015-09-12 DIAGNOSIS — R0602 Shortness of breath: Secondary | ICD-10-CM | POA: Diagnosis not present

## 2015-09-12 DIAGNOSIS — Z792 Long term (current) use of antibiotics: Secondary | ICD-10-CM | POA: Diagnosis not present

## 2015-09-12 DIAGNOSIS — G8929 Other chronic pain: Secondary | ICD-10-CM | POA: Insufficient documentation

## 2015-09-12 DIAGNOSIS — H538 Other visual disturbances: Secondary | ICD-10-CM | POA: Diagnosis not present

## 2015-09-12 DIAGNOSIS — R55 Syncope and collapse: Secondary | ICD-10-CM | POA: Insufficient documentation

## 2015-09-12 DIAGNOSIS — R509 Fever, unspecified: Secondary | ICD-10-CM | POA: Insufficient documentation

## 2015-09-12 DIAGNOSIS — R197 Diarrhea, unspecified: Secondary | ICD-10-CM | POA: Diagnosis not present

## 2015-09-12 DIAGNOSIS — R42 Dizziness and giddiness: Secondary | ICD-10-CM | POA: Insufficient documentation

## 2015-09-12 DIAGNOSIS — Z7982 Long term (current) use of aspirin: Secondary | ICD-10-CM | POA: Insufficient documentation

## 2015-09-12 DIAGNOSIS — M549 Dorsalgia, unspecified: Secondary | ICD-10-CM | POA: Diagnosis not present

## 2015-09-12 DIAGNOSIS — Z7952 Long term (current) use of systemic steroids: Secondary | ICD-10-CM | POA: Diagnosis not present

## 2015-09-12 DIAGNOSIS — Z8659 Personal history of other mental and behavioral disorders: Secondary | ICD-10-CM | POA: Diagnosis not present

## 2015-09-12 DIAGNOSIS — N186 End stage renal disease: Secondary | ICD-10-CM | POA: Diagnosis not present

## 2015-09-12 DIAGNOSIS — K219 Gastro-esophageal reflux disease without esophagitis: Secondary | ICD-10-CM | POA: Insufficient documentation

## 2015-09-12 DIAGNOSIS — Z992 Dependence on renal dialysis: Secondary | ICD-10-CM | POA: Insufficient documentation

## 2015-09-12 DIAGNOSIS — R51 Headache: Secondary | ICD-10-CM | POA: Diagnosis not present

## 2015-09-12 DIAGNOSIS — I120 Hypertensive chronic kidney disease with stage 5 chronic kidney disease or end stage renal disease: Secondary | ICD-10-CM | POA: Insufficient documentation

## 2015-09-12 DIAGNOSIS — R300 Dysuria: Secondary | ICD-10-CM | POA: Insufficient documentation

## 2015-09-12 DIAGNOSIS — E109 Type 1 diabetes mellitus without complications: Secondary | ICD-10-CM | POA: Insufficient documentation

## 2015-09-12 LAB — COMPREHENSIVE METABOLIC PANEL
ALT: 17 U/L (ref 17–63)
AST: 18 U/L (ref 15–41)
Albumin: 3.6 g/dL (ref 3.5–5.0)
Alkaline Phosphatase: 99 U/L (ref 38–126)
Anion gap: 8 (ref 5–15)
BUN: 11 mg/dL (ref 6–20)
CO2: 26 mmol/L (ref 22–32)
Calcium: 9.1 mg/dL (ref 8.9–10.3)
Chloride: 98 mmol/L — ABNORMAL LOW (ref 101–111)
Creatinine, Ser: 1.56 mg/dL — ABNORMAL HIGH (ref 0.61–1.24)
GFR calc Af Amer: >60 mL/min (ref >60)
GFR calc non Af Amer: 54 mL/min — ABNORMAL LOW (ref >60)
Glucose, Bld: 103 mg/dL — ABNORMAL HIGH (ref 65–99)
POTASSIUM: 4.4 mmol/L (ref 3.5–5.1)
Sodium: 132 mmol/L — ABNORMAL LOW (ref 135–145)
Total Bilirubin: 1 mg/dL (ref 0.3–1.2)
Total Protein: 6.9 g/dL (ref 6.5–8.1)

## 2015-09-12 LAB — LIPASE, BLOOD: Lipase: 26 U/L (ref 22–51)

## 2015-09-12 LAB — CBC
HEMATOCRIT: 58 % — AB (ref 39.0–52.0)
Hemoglobin: 19.3 g/dL — ABNORMAL HIGH (ref 13.0–17.0)
MCH: 31.9 pg (ref 26.0–34.0)
MCHC: 33.3 g/dL (ref 30.0–36.0)
MCV: 95.9 fL (ref 78.0–100.0)
PLATELETS: 184 10*3/uL (ref 150–400)
RBC: 6.05 MIL/uL — ABNORMAL HIGH (ref 4.22–5.81)
RDW: 15.1 % (ref 11.5–15.5)
WBC: 4.5 10*3/uL (ref 4.0–10.5)

## 2015-09-12 MED ORDER — SODIUM CHLORIDE 0.9 % IV BOLUS (SEPSIS)
1000.0000 mL | Freq: Once | INTRAVENOUS | Status: AC
  Administered 2015-09-12: 1000 mL via INTRAVENOUS

## 2015-09-12 MED ORDER — PROMETHAZINE HCL 25 MG/ML IJ SOLN
25.0000 mg | Freq: Once | INTRAMUSCULAR | Status: AC
  Administered 2015-09-12: 25 mg via INTRAVENOUS
  Filled 2015-09-12: qty 1

## 2015-09-12 NOTE — ED Notes (Signed)
Nurse called to triage room by pts girlfriend, pt with severe nausea and diaphoresis.

## 2015-09-12 NOTE — ED Notes (Signed)
Pt reports nv since yesterday. Pt reports generalized weakness now too. Pt took home phenergan without relief.

## 2015-09-12 NOTE — ED Provider Notes (Signed)
The patient is a 39 year old male, history of diabetes, bilateral lower extremity amputations below the knee, diabetic gastroparesis who presents with 2 days of nausea vomiting and diffuse mild abdominal pain. On exam he has no heart and lung sounds, soft abdomen with minimal diffuse tenderness, no guarding, no peritoneal signs. Labs and fluids ordered, anti-medics ordered, he can go home if he tolerates by mouth.  Medical screening examination/treatment/procedure(s) were conducted as a shared visit with non-physician practitioner(s) and myself.  I personally evaluated the patient during the encounter.  Clinical Impression:   Final diagnoses:  Non-intractable vomiting with nausea, vomiting of unspecified type         Noemi Chapel, MD 09/13/15 2340

## 2015-09-12 NOTE — ED Notes (Signed)
Pts girlfriend Herbie Drape, works on [employer] phone number [phone]

## 2015-09-12 NOTE — ED Notes (Signed)
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

## 2015-09-13 DIAGNOSIS — R112 Nausea with vomiting, unspecified: Secondary | ICD-10-CM | POA: Diagnosis not present

## 2015-09-13 LAB — URINALYSIS, ROUTINE W REFLEX MICROSCOPIC
Bilirubin Urine: NEGATIVE
GLUCOSE, UA: NEGATIVE mg/dL
Hgb urine dipstick: NEGATIVE
KETONES UR: 15 mg/dL — AB
Leukocytes, UA: NEGATIVE
NITRITE: NEGATIVE
PH: 5.5 (ref 5.0–8.0)
Protein, ur: NEGATIVE mg/dL
Specific Gravity, Urine: 1.011 (ref 1.005–1.030)
Urobilinogen, UA: 0.2 mg/dL (ref 0.0–1.0)

## 2015-09-13 MED ORDER — METOCLOPRAMIDE HCL 5 MG/ML IJ SOLN
10.0000 mg | Freq: Once | INTRAMUSCULAR | Status: AC
  Administered 2015-09-13: 10 mg via INTRAVENOUS
  Filled 2015-09-13: qty 2

## 2015-09-13 MED ORDER — LORAZEPAM 2 MG/ML IJ SOLN
1.0000 mg | Freq: Once | INTRAMUSCULAR | Status: AC
  Administered 2015-09-13: 1 mg via INTRAVENOUS
  Filled 2015-09-13: qty 1

## 2015-09-13 MED ORDER — SODIUM CHLORIDE 0.9 % IV BOLUS (SEPSIS)
1000.0000 mL | Freq: Once | INTRAVENOUS | Status: AC
  Administered 2015-09-13: 1000 mL via INTRAVENOUS

## 2015-09-13 MED ORDER — METOCLOPRAMIDE HCL 10 MG PO TABS: 10.0000 mg | ORAL_TABLET | Freq: Four times a day (QID) | ORAL | Status: DC

## 2015-09-13 NOTE — ED Notes (Signed)
Pt refused to put prosthetic legs on for standing orthostatic vital signs

## 2015-09-13 NOTE — Discharge Instructions (Signed)
Return here as needed.  Follow-up with your primary care doctor, increase your fluid intake slowly.

## 2015-09-13 NOTE — ED Provider Notes (Signed)
CSN: [id number]     Arrival date & time 09/12/15  1939 History   First MD Initiated Contact with Patient 09/12/15 2135     Chief Complaint  Patient presents with  . Emesis     (Consider location/radiation/quality/duration/timing/severity/associated sxs/prior Treatment) HPI Patient presents to the emergency department with nausea and vomiting that started 2 days ago.  The patient states that he has not had any fever, diarrhea, bloody stool, hematemesis, dysuria, incontinence, back pain, neck pain, chest pain, shortness of breath, lightheadedness, headache, blurred vision, dizziness, near syncope or syncope.  The patient states that his primary doctor.  Advised him to come to the emergency department since the antiemetics at home were not working.  Patient states nothing seemed to make his condition, better or worse.  Patient did not try any other treatments Past Medical History  Diagnosis Date  . Hypertension   . GERD (gastroesophageal reflux disease)   . Gastropathy   . Chronic pain   . Depression   . Edema   . Headache(784.0)   . 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 Dialysis patient 04/18/12    "[facility]; [place], [place], [place]"  . Blood transfusion   . Arthritis     "I think I do; just in my fingers & my hands"  . Type I diabetes mellitus     "juvenile"  . Gastroparesis   . ESRD (end stage renal disease) on dialysis    Past Surgical History  Procedure Laterality Date  . Below knee leg amputation  "it's been awhile"    bilaterally  . Cataract extraction  ~ 2011    right  . Av fistula placement  08/2011    left upper arm  . Combined kidney-pancreas transplant     Family History  Problem Relation Age of Onset  . Diabetes Other   . Hypertension Other    Social History  Substance Use Topics  . Smoking status: Never Smoker   . Smokeless tobacco: Never Used  . Alcohol Use: No    Review of Systems All other systems negative except as documented in the  HPI. All pertinent positives and negatives as reviewed in the HPI.   Allergies  Review of patient's allergies indicates no known allergies.  Home Medications   Prior to Admission medications   Medication Sig Start Date End Date Taking? Authorizing Provider  aspirin EC 81 MG tablet Take 81 mg by mouth daily.   Yes Historical Provider, MD  cyclobenzaprine (FLEXERIL) 10 MG tablet Take 1 tablet (10 mg total) by mouth 2 (two) times daily as needed for muscle spasms. 01/08/15  Yes Gareth Morgan, MD  mycophenolate (MYFORTIC) 180 MG EC tablet Take 360 mg by mouth 2 (two) times daily.  07/06/13  Yes Historical Provider, MD  omeprazole (PRILOSEC) 20 MG capsule Take 1 capsule (20 mg total) by mouth daily as needed (heartburn). 06/06/14  Yes Linton Flemings, MD  ondansetron (ZOFRAN ODT) 4 MG disintegrating tablet Take 1 tablet (4 mg total) by mouth every 8 (eight) hours as needed for nausea or vomiting. 01/08/15  Yes Gareth Morgan, MD  oxyCODONE-acetaminophen (PERCOCET) 7.5-325 MG per tablet Take 1 tablet by mouth 3 (three) times daily as needed. 09/04/15  Yes Historical Provider, MD  predniSONE (DELTASONE) 5 MG tablet Take 5 mg by mouth daily.  06/20/13  Yes Historical Provider, MD  promethazine (PHENERGAN) 25 MG tablet Take 25 mg by mouth every 6 (six) hours as needed for nausea or vomiting.  Yes Historical Provider, MD  sulfamethoxazole-trimethoprim (BACTRIM,SEPTRA) 400-80 MG per tablet Take 1 tablet by mouth every Monday, Wednesday, and Friday.    Yes Historical Provider, MD  tacrolimus (PROGRAF) 1 MG capsule Take 3-4 mg by mouth 2 (two) times daily. 4mg  in the morning, 3mg  in the evening 07/16/13  Yes Historical Provider, MD   BP 172/95 mmHg  Pulse 82  Temp(Src) 98.7 F (37.1 C) (Oral)  Resp 22  SpO2 98% Physical Exam  Constitutional: He is oriented to person, place, and time. He appears well-developed and well-nourished. No distress.  HENT:  Head: Normocephalic and atraumatic.  Mouth/Throat:  Oropharynx is clear and moist. No oropharyngeal exudate.  Eyes: Pupils are equal, round, and reactive to light.  Neck: Normal range of motion. Neck supple.  Cardiovascular: Normal rate, regular rhythm and normal heart sounds.  Exam reveals no gallop and no friction rub.   No murmur heard. Pulmonary/Chest: Effort normal and breath sounds normal. No respiratory distress.  Abdominal: Soft. Bowel sounds are normal. He exhibits no distension. There is no tenderness. There is no rebound and no guarding.  Neurological: He is alert and oriented to person, place, and time. He exhibits normal muscle tone. Coordination normal.  Skin: Skin is warm and dry. No rash noted. No erythema.  Nursing note and vitals reviewed.   ED Course  Procedures (including critical care time) Labs Review Labs Reviewed  COMPREHENSIVE METABOLIC PANEL - Abnormal; Notable for the following:    Sodium 132 (*)    Chloride 98 (*)    Glucose, Bld 103 (*)    Creatinine, Ser 1.56 (*)    GFR calc non Af Amer 54 (*)    All other components within normal limits  CBC - Abnormal; Notable for the following:    RBC 6.05 (*)    Hemoglobin 19.3 (*)    HCT 58.0 (*)    All other components within normal limits  URINALYSIS, ROUTINE W REFLEX MICROSCOPIC (NOT AT [facility]) - Abnormal; Notable for the following:    Ketones, ur 15 (*)    All other components within normal limits  LIPASE, BLOOD    Imaging Review No results found. I have personally reviewed and evaluated these images and lab results as part of my medical decision-making.  The patient's vital signs remained stable here in the emergency department.  He is given antiemetics and is feeling some better.  Patient will be given a fluid trial and told to return here as needed.  Patient agrees the plan and all questions were answered.   Dalia Heading, PA-C 09/13/15 0134  Noemi Chapel, MD 09/13/15 [phone]

## 2015-09-13 NOTE — ED Notes (Signed)
Pt stable, ambulatory, states understanding of discharge instructions 

## 2015-09-30 ENCOUNTER — Emergency Department (HOSPITAL_COMMUNITY)
Admission: EM | Admit: 2015-09-30 | Discharge: 2015-09-30 | Disposition: A | Discharge status: Home / Self Care | Payer: Medicare Other | Attending: Emergency Medicine | Admitting: Emergency Medicine

## 2015-09-30 ENCOUNTER — Encounter (HOSPITAL_COMMUNITY): Payer: Self-pay

## 2015-09-30 ENCOUNTER — Emergency Department (HOSPITAL_COMMUNITY): Payer: Medicare Other

## 2015-09-30 DIAGNOSIS — N186 End stage renal disease: Secondary | ICD-10-CM | POA: Insufficient documentation

## 2015-09-30 DIAGNOSIS — M1990 Unspecified osteoarthritis, unspecified site: Secondary | ICD-10-CM | POA: Insufficient documentation

## 2015-09-30 DIAGNOSIS — W19XXXA Unspecified fall, initial encounter: Secondary | ICD-10-CM

## 2015-09-30 DIAGNOSIS — Y999 Unspecified external cause status: Secondary | ICD-10-CM | POA: Insufficient documentation

## 2015-09-30 DIAGNOSIS — E109 Type 1 diabetes mellitus without complications: Secondary | ICD-10-CM | POA: Diagnosis not present

## 2015-09-30 DIAGNOSIS — Z7982 Long term (current) use of aspirin: Secondary | ICD-10-CM | POA: Insufficient documentation

## 2015-09-30 DIAGNOSIS — Z89519 Acquired absence of unspecified leg below knee: Secondary | ICD-10-CM | POA: Diagnosis not present

## 2015-09-30 DIAGNOSIS — I120 Hypertensive chronic kidney disease with stage 5 chronic kidney disease or end stage renal disease: Secondary | ICD-10-CM | POA: Diagnosis not present

## 2015-09-30 DIAGNOSIS — Z79899 Other long term (current) drug therapy: Secondary | ICD-10-CM | POA: Insufficient documentation

## 2015-09-30 DIAGNOSIS — Z792 Long term (current) use of antibiotics: Secondary | ICD-10-CM | POA: Insufficient documentation

## 2015-09-30 DIAGNOSIS — K219 Gastro-esophageal reflux disease without esophagitis: Secondary | ICD-10-CM | POA: Insufficient documentation

## 2015-09-30 DIAGNOSIS — G8929 Other chronic pain: Secondary | ICD-10-CM | POA: Diagnosis not present

## 2015-09-30 DIAGNOSIS — S4992XA Unspecified injury of left shoulder and upper arm, initial encounter: Secondary | ICD-10-CM | POA: Diagnosis not present

## 2015-09-30 DIAGNOSIS — Y929 Unspecified place or not applicable: Secondary | ICD-10-CM | POA: Diagnosis not present

## 2015-09-30 DIAGNOSIS — Z7952 Long term (current) use of systemic steroids: Secondary | ICD-10-CM | POA: Diagnosis not present

## 2015-09-30 DIAGNOSIS — Z992 Dependence on renal dialysis: Secondary | ICD-10-CM | POA: Insufficient documentation

## 2015-09-30 DIAGNOSIS — S59902A Unspecified injury of left elbow, initial encounter: Secondary | ICD-10-CM | POA: Insufficient documentation

## 2015-09-30 DIAGNOSIS — S6992XA Unspecified injury of left wrist, hand and finger(s), initial encounter: Secondary | ICD-10-CM | POA: Insufficient documentation

## 2015-09-30 DIAGNOSIS — Y939 Activity, unspecified: Secondary | ICD-10-CM | POA: Insufficient documentation

## 2015-09-30 DIAGNOSIS — S59912A Unspecified injury of left forearm, initial encounter: Secondary | ICD-10-CM | POA: Diagnosis not present

## 2015-09-30 DIAGNOSIS — W108XXA Fall (on) (from) other stairs and steps, initial encounter: Secondary | ICD-10-CM | POA: Insufficient documentation

## 2015-09-30 DIAGNOSIS — M25532 Pain in left wrist: Secondary | ICD-10-CM

## 2015-09-30 DIAGNOSIS — Z8659 Personal history of other mental and behavioral disorders: Secondary | ICD-10-CM | POA: Insufficient documentation

## 2015-09-30 NOTE — ED Notes (Signed)
Pt. Left with out discharge paperwork

## 2015-09-30 NOTE — Discharge Instructions (Signed)
Musculoskeletal Pain Musculoskeletal pain is muscle and boney aches and pains. These pains can occur in any part of the body. Your caregiver may treat you without knowing the cause of the pain. They may treat you if blood or urine tests, X-rays, and other tests were normal.  CAUSES There is often not a definite cause or reason for these pains. These pains may be caused by a type of germ (virus). The discomfort may also come from overuse. Overuse includes working out too hard when your body is not fit. Boney aches also come from weather changes. Bone is sensitive to atmospheric pressure changes. HOME CARE INSTRUCTIONS   Ask when your test results will be ready. Make sure you get your test results.  Only take over-the-counter or prescription medicines for pain, discomfort, or fever as directed by your caregiver. If you were given medications for your condition, do not drive, operate machinery or power tools, or sign legal documents for 24 hours. Do not drink alcohol. Do not take sleeping pills or other medications that may interfere with treatment.  Continue all activities unless the activities cause more pain. When the pain lessens, slowly resume normal activities. Gradually increase the intensity and duration of the activities or exercise.  During periods of severe pain, bed rest may be helpful. Lay or sit in any position that is comfortable.  Putting ice on the injured area.  Put ice in a bag.  Place a towel between your skin and the bag.  Leave the ice on for 15 to 20 minutes, 3 to 4 times a day.  Follow up with your caregiver for continued problems and no reason can be found for the pain. If the pain becomes worse or does not go away, it may be necessary to repeat tests or do additional testing. Your caregiver may need to look further for a possible cause. SEEK IMMEDIATE MEDICAL CARE IF:  You have pain that is getting worse and is not relieved by medications.  You develop chest pain  that is associated with shortness or breath, sweating, feeling sick to your stomach (nauseous), or throw up (vomit).  Your pain becomes localized to the abdomen.  You develop any new symptoms that seem different or that concern you. MAKE SURE YOU:   Understand these instructions.  Will watch your condition.  Will get help right away if you are not doing well or get worse.   This information is not intended to replace advice given to you by your health care provider. Make sure you discuss any questions you have with your health care provider.   Document Released: 12/12/2005 Document Revised: 03/05/2012 Document Reviewed: 08/16/2013 Elsevier Interactive Patient Education 2016 [employer]  Wrist Pain There are many things that can cause wrist pain. Some common causes include:  An injury to the wrist area.  Overuse of the joint.  A condition that causes too much pressure to be put on a nerve in the wrist (carpal tunnel syndrome).  Wear and tear of the joints that happens as a person gets older (osteoarthritis).  Other types of arthritis. Sometimes, the cause is not known. The pain often goes away when you follow instructions from your doctor about relieving pain at home. If your wrist pain does not go away, tests may need to be done to find the cause. HOME CARE Pay attention to any changes in your symptoms. Take these actions to help with your pain:  Rest your wrist for at least 48 hours or as  told by your doctor.  If your doctor tells you to, put ice on the injured area:  Put ice in a plastic bag.  Place a towel between your skin and the bag.  Leave the ice on for 20 minutes, 2-3 times per day.  Keep your arm raised (elevated) above the level of your heart while you are sitting or lying down.  If a splint or elastic bandage has been put on the injured area:  Wear it as told by your doctor.  Take the splint or bandage off only as told by your doctor.  Loosen the  splint or bandage if your fingers lose feeling (are numb) or have a tingling feeling, or if they turn cold or blue.  Take over-the-counter and prescription medicines only as told by your doctor.  Keep all follow-up visits as told by your doctor. This is important. GET HELP IF:  Your pain is not helped by treatment.  Your pain gets worse. GET HELP RIGHT AWAY IF:   Your fingers swell.  Your fingers turn white, very red, or cold and blue.  Your fingers lose feeling or have a tingling feeling.  You have trouble moving your fingers.   This information is not intended to replace advice given to you by your health care provider. Make sure you discuss any questions you have with your health care provider.   Document Released: 05/30/2008 Document Revised: 09/02/2015 Document Reviewed: 04/29/2015 Elsevier Interactive Patient Education [employer].

## 2015-09-30 NOTE — ED Provider Notes (Signed)
CSN: [id number]     Arrival date & time 09/30/15  1854 History   First MD Initiated Contact with Patient 09/30/15 2028     Chief Complaint  Patient presents with  . Fall    yesterday with left side pain     (Consider location/radiation/quality/duration/timing/severity/associated sxs/prior Treatment) Patient is a 39 y.o. male presenting with trauma.  Trauma Mechanism of injury: amputation Injury location: shoulder/arm Injury location detail: L arm  Amputation:      Cause: kitchen appliance   Current symptoms:      Pain scale: 6/10      Pain quality: aching      Pain timing: constant   Past Medical History  Diagnosis Date  . Hypertension   . GERD (gastroesophageal reflux disease)   . Gastropathy   . Chronic pain   . Depression   . Edema   . Headache(784.0)   . 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 Dialysis patient [facility]) 04/18/12    "[facility]; Tues, [place], [place]"  . Blood transfusion   . Arthritis     "I think I do; just in my fingers & my hands"  . 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 Gastroparesis   . ESRD (end stage renal disease) on dialysis [facility])    Past Surgical History  Procedure Laterality Date  . Below knee leg amputation  "it's been awhile"    bilaterally  . Cataract extraction  ~ 2011    right  . Av fistula placement  08/2011    left upper arm  . Combined kidney-pancreas transplant     Family History  Problem Relation Age of Onset  . Diabetes Other   . Hypertension Other    Social History  Substance Use Topics  . Smoking status: Never Smoker   . Smokeless tobacco: Never Used  . Alcohol Use: No    Review of Systems  All other systems reviewed and are negative.     Allergies  Review of patient's allergies indicates no known allergies.  Home Medications   Prior to Admission medications   Medication Sig Start Date End Date Taking? Authorizing Provider  aspirin EC 81 MG tablet Take 81 mg by mouth  daily.    Historical Provider, MD  cyclobenzaprine (FLEXERIL) 10 MG tablet Take 1 tablet (10 mg total) by mouth 2 (two) times daily as needed for muscle spasms. 01/08/15   Gareth Morgan, MD  metoCLOPramide (REGLAN) 10 MG tablet Take 1 tablet (10 mg total) by mouth every 6 (six) hours. 09/13/15   Dalia Heading, PA-C  mycophenolate (MYFORTIC) 180 MG EC tablet Take 360 mg by mouth 2 (two) times daily.  07/06/13   Historical Provider, MD  omeprazole (PRILOSEC) 20 MG capsule Take 1 capsule (20 mg total) by mouth daily as needed (heartburn). 06/06/14   Linton Flemings, MD  ondansetron (ZOFRAN ODT) 4 MG disintegrating tablet Take 1 tablet (4 mg total) by mouth every 8 (eight) hours as needed for nausea or vomiting. 01/08/15   Gareth Morgan, MD  oxyCODONE-acetaminophen (PERCOCET) 7.5-325 MG per tablet Take 1 tablet by mouth 3 (three) times daily as needed. 09/04/15   Historical Provider, MD  predniSONE (DELTASONE) 5 MG tablet Take 5 mg by mouth daily.  06/20/13   Historical Provider, MD  promethazine (PHENERGAN) 25 MG tablet Take 25 mg by mouth every 6 (six) hours as needed for nausea or vomiting.    Historical Provider, MD  sulfamethoxazole-trimethoprim Octavio Graves)  400-80 MG per tablet Take 1 tablet by mouth every Monday, Wednesday, and Friday.     Historical Provider, MD  tacrolimus (PROGRAF) 1 MG capsule Take 3-4 mg by mouth 2 (two) times daily. 4mg  in the morning, 3mg  in the evening 07/16/13   Historical Provider, MD   BP 164/92 mmHg  Pulse 88  Temp(Src) 98.4 F (36.9 C) (Oral)  Resp 20  Ht 6\' 1"  (1.854 m)  Wt 171 lb 9.6 oz (77.837 kg)  BMI 22.64 kg/m2  SpO2 99% Physical Exam  Constitutional: He is oriented to person, place, and time. He appears well-developed and well-nourished. No distress.  HENT:  Head: Normocephalic.  Eyes: Pupils are equal, round, and reactive to light.  Neck: Normal range of motion. No thyromegaly present.  No midline cervical tenderness  Abdominal: Soft. He exhibits no  distension. There is no tenderness.  Musculoskeletal: Normal range of motion. He exhibits no edema or tenderness.  Minimal tenderness to left humerus, elbow, and forearm with full ROM. +2 radial pulses. Median, radial and ulnar nerve intact. Slight sweling to distal radius. No anatomic snuff box tenderness. No hand tenderness.   Bilateral BKA amputation  No thoracic or lumbar tenderness.   Neurological: He is alert and oriented to person, place, and time.  Skin: Skin is warm and dry. He is not diaphoretic. No erythema.  Psychiatric: He has a normal mood and affect. His behavior is normal. Judgment and thought content normal.  Nursing note and vitals reviewed.   ED Course  Procedures (including critical care time) Imaging Review Dg Forearm Left  09/30/2015   CLINICAL DATA:  Left forearm pain after fall down steps. Initial encounter.  EXAM: LEFT FOREARM - 2 VIEW  COMPARISON:  Left forearm radiographs performed 05/21/2006  FINDINGS: There is no evidence of fracture or dislocation. The radius and ulna appear grossly intact. The elbow joint is unremarkable in appearance. No elbow joint effusion is identified.  The carpal rows appear grossly intact, and demonstrate normal alignment. Diffuse vascular calcifications are seen. No additional soft tissue abnormalities are characterized on radiograph.  IMPRESSION: 1. No evidence of fracture or dislocation. 2. Diffuse vascular calcifications seen.   Electronically Signed   By: Garald Balding M.D.   On: 09/30/2015 21:45   Dg Wrist Complete Left  09/30/2015   CLINICAL DATA:  Pain following fall 1 day prior  EXAM: LEFT WRIST - COMPLETE 3+ VIEW  COMPARISON:  Left hand July 26, 2006  FINDINGS: Frontal, oblique, lateral, and ulnar deviation scaphoid images were obtained. There is no demonstrable fracture or dislocation. Joint spaces appear intact. No erosive change. There is extensive arterial vascular calcification.  IMPRESSION: No fracture or dislocation. No  appreciable arthropathy. There is extensive arterial vascular calcification.   Electronically Signed   By: Lowella Grip III M.D.   On: 09/30/2015 21:46   Dg Humerus Left  09/30/2015   CLINICAL DATA:  Post fall down steps now with left arm pain.  EXAM: LEFT HUMERUS - 2+ VIEW  COMPARISON:  Left forearm radiographs-earlier same day  FINDINGS: There is a serpiginous lucency extending through the acromion. Otherwise, no definitive displaced fracture. No definite glenohumeral joint dislocation. Normal appearance of the acromioclavicular joint. Vascular calcifications. Surgical clips are seen within the left axilla. No radiopaque foreign body.  IMPRESSION: Serpiginous lucency extending through the acromion may represent a prominent nutrient foramen E an though a nondisplaced fracture could have a similar appearance. Correlation point tenderness at this location is recommended. Further evaluation with  dedicated radiographs of the left shoulder could be performed as clinically indicated.   Electronically Signed   By: Sandi Mariscal M.D.   On: 09/30/2015 21:47   I have personally reviewed and evaluated these images and lab results as part of my medical decision-making.   MDM   Patient presents emergency department after a fall from steps yesterday. Patient states the continues to have left forearm swelling and pain and he would like to be checked out. He reportedly fell Yesterday down 7 steps. He denies hitting his head or lose consciousness. He's had no other problems at this time.  Physical exam unremarkable except noted as above.   X-rays were unremarkable for any acute fracture. There is slight lucency of his left acromion patient was nontender. Given his slight swelling pain without fracture patient was placed in a splint for comfort and was discharged home. We did not recommend Motrin due to his history of renal transplant. Patient discharged home in good health and will follow up with primary care  physician as needed.  Final diagnoses:  Fall, initial encounter  Left wrist pain       Roberto Scales, MD 10/01/15 0206  Quintella Reichert, MD 10/03/15 1414

## 2015-09-30 NOTE — ED Notes (Signed)
Pt fell yesterday and has bilateral prosthesis. States the right one came off when he was going down 7 steps and fell down all of them. States he is having pain to his entire left side of his body.

## 2015-10-05 ENCOUNTER — Ambulatory Visit: Payer: Medicare Other | Admitting: Family Medicine

## 2015-10-09 ENCOUNTER — Ambulatory Visit: Payer: Medicare Other

## 2015-10-16 ENCOUNTER — Encounter (HOSPITAL_BASED_OUTPATIENT_CLINIC_OR_DEPARTMENT_OTHER): Payer: No Typology Code available for payment source | Attending: Internal Medicine

## 2015-11-03 ENCOUNTER — Ambulatory Visit (INDEPENDENT_AMBULATORY_CARE_PROVIDER_SITE_OTHER): Payer: Medicare Other | Admitting: *Deleted

## 2015-11-03 DIAGNOSIS — Z23 Encounter for immunization: Secondary | ICD-10-CM

## 2015-12-14 ENCOUNTER — Encounter: Payer: Self-pay | Admitting: Family Medicine

## 2015-12-14 ENCOUNTER — Ambulatory Visit (INDEPENDENT_AMBULATORY_CARE_PROVIDER_SITE_OTHER): Payer: Medicare Other | Admitting: Family Medicine

## 2015-12-14 VITALS — BP 158/98 | HR 91 | Temp 98.7°F | Ht 73.0 in | Wt 160.3 lb

## 2015-12-14 DIAGNOSIS — R300 Dysuria: Secondary | ICD-10-CM | POA: Diagnosis present

## 2015-12-14 DIAGNOSIS — M546 Pain in thoracic spine: Secondary | ICD-10-CM

## 2015-12-14 LAB — POCT URINALYSIS DIPSTICK
GLUCOSE UA: NEGATIVE
Leukocytes, UA: NEGATIVE
Nitrite, UA: NEGATIVE
SPEC GRAV UA: 1.025
UROBILINOGEN UA: 1
pH, UA: 6

## 2015-12-14 LAB — POCT UA - MICROSCOPIC ONLY
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
## 2015-12-14 NOTE — Patient Instructions (Signed)
Thank you for coming in,   Please try the exercises.   You can use aspercream for the pain which can be rubbed on.   If your symptoms don't improve then please follow up and we may need to try physical therapy.   Sign up for My Chart to have easy access to your labs results, and communication with your Primary care physician   Please feel free to call with any questions or concerns at any time, at [phone]. --Dr. Raeford Razor

## 2015-12-15 ENCOUNTER — Encounter (HOSPITAL_COMMUNITY): Payer: Self-pay

## 2015-12-15 ENCOUNTER — Emergency Department (HOSPITAL_COMMUNITY)
Admission: EM | Admit: 2015-12-15 | Discharge: 2015-12-16 | Disposition: A | Discharge status: Home / Self Care | Payer: Medicare Other | Source: Home / Self Care | Attending: Emergency Medicine | Admitting: Emergency Medicine

## 2015-12-15 DIAGNOSIS — Z79899 Other long term (current) drug therapy: Secondary | ICD-10-CM

## 2015-12-15 DIAGNOSIS — N289 Disorder of kidney and ureter, unspecified: Secondary | ICD-10-CM

## 2015-12-15 DIAGNOSIS — M19042 Primary osteoarthritis, left hand: Secondary | ICD-10-CM

## 2015-12-15 DIAGNOSIS — K219 Gastro-esophageal reflux disease without esophagitis: Secondary | ICD-10-CM | POA: Diagnosis present

## 2015-12-15 DIAGNOSIS — F4310 Post-traumatic stress disorder, unspecified: Secondary | ICD-10-CM | POA: Diagnosis present

## 2015-12-15 DIAGNOSIS — R1084 Generalized abdominal pain: Secondary | ICD-10-CM

## 2015-12-15 DIAGNOSIS — G43A1 Cyclical vomiting, intractable: Secondary | ICD-10-CM | POA: Diagnosis present

## 2015-12-15 DIAGNOSIS — M19041 Primary osteoarthritis, right hand: Secondary | ICD-10-CM

## 2015-12-15 DIAGNOSIS — F329 Major depressive disorder, single episode, unspecified: Secondary | ICD-10-CM | POA: Diagnosis present

## 2015-12-15 DIAGNOSIS — F1290 Cannabis use, unspecified, uncomplicated: Secondary | ICD-10-CM | POA: Diagnosis not present

## 2015-12-15 DIAGNOSIS — Z9483 Pancreas transplant status: Secondary | ICD-10-CM | POA: Diagnosis not present

## 2015-12-15 DIAGNOSIS — Z89512 Acquired absence of left leg below knee: Secondary | ICD-10-CM

## 2015-12-15 DIAGNOSIS — K2970 Gastritis, unspecified, without bleeding: Secondary | ICD-10-CM | POA: Diagnosis present

## 2015-12-15 DIAGNOSIS — K3184 Gastroparesis: Secondary | ICD-10-CM | POA: Diagnosis present

## 2015-12-15 DIAGNOSIS — G8929 Other chronic pain: Secondary | ICD-10-CM | POA: Insufficient documentation

## 2015-12-15 DIAGNOSIS — Z8639 Personal history of other endocrine, nutritional and metabolic disease: Secondary | ICD-10-CM

## 2015-12-15 DIAGNOSIS — Z89511 Acquired absence of right leg below knee: Secondary | ICD-10-CM | POA: Diagnosis not present

## 2015-12-15 DIAGNOSIS — E860 Dehydration: Secondary | ICD-10-CM | POA: Diagnosis present

## 2015-12-15 DIAGNOSIS — F419 Anxiety disorder, unspecified: Secondary | ICD-10-CM

## 2015-12-15 DIAGNOSIS — Z7982 Long term (current) use of aspirin: Secondary | ICD-10-CM

## 2015-12-15 DIAGNOSIS — Z992 Dependence on renal dialysis: Secondary | ICD-10-CM

## 2015-12-15 DIAGNOSIS — M1990 Unspecified osteoarthritis, unspecified site: Secondary | ICD-10-CM | POA: Diagnosis not present

## 2015-12-15 DIAGNOSIS — Z9841 Cataract extraction status, right eye: Secondary | ICD-10-CM | POA: Diagnosis not present

## 2015-12-15 DIAGNOSIS — Z792 Long term (current) use of antibiotics: Secondary | ICD-10-CM | POA: Insufficient documentation

## 2015-12-15 DIAGNOSIS — I10 Essential (primary) hypertension: Secondary | ICD-10-CM | POA: Diagnosis present

## 2015-12-15 DIAGNOSIS — Z940 Kidney transplant status: Secondary | ICD-10-CM | POA: Diagnosis not present

## 2015-12-15 DIAGNOSIS — M549 Dorsalgia, unspecified: Secondary | ICD-10-CM | POA: Insufficient documentation

## 2015-12-15 DIAGNOSIS — Z89519 Acquired absence of unspecified leg below knee: Secondary | ICD-10-CM

## 2015-12-15 DIAGNOSIS — E109 Type 1 diabetes mellitus without complications: Secondary | ICD-10-CM | POA: Insufficient documentation

## 2015-12-15 DIAGNOSIS — I120 Hypertensive chronic kidney disease with stage 5 chronic kidney disease or end stage renal disease: Secondary | ICD-10-CM

## 2015-12-15 DIAGNOSIS — N186 End stage renal disease: Secondary | ICD-10-CM | POA: Insufficient documentation

## 2015-12-15 DIAGNOSIS — E785 Hyperlipidemia, unspecified: Secondary | ICD-10-CM | POA: Diagnosis not present

## 2015-12-15 DIAGNOSIS — Z7952 Long term (current) use of systemic steroids: Secondary | ICD-10-CM

## 2015-12-15 LAB — CBG MONITORING, ED: Glucose-Capillary: 110 mg/dL — ABNORMAL HIGH (ref 65–99)

## 2015-12-15 NOTE — ED Notes (Signed)
Pt here for abd pain and vomiting for the past three days. No diarrhea. Pt had a kidney and pancreatic transplant 2014 and denies being diabetic anymore. Denies taking meds for HTN either. Only takes medications for transplants.
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
## 2015-12-15 NOTE — ED Provider Notes (Signed)
CSN: [id number]     Arrival date & time 12/15/15  2340 History   By signing my name below, I, Forrestine Him, attest that this documentation has been prepared under the direction and in the presence of Ripley Fraise, MD.  Electronically Signed: Forrestine Him, ED Scribe. 12/15/2015. 12:19 AM.   Chief Complaint  Patient presents with  . Abdominal Pain   The history is provided by the patient. No language interpreter was used.    HPI Comments: [name] is a 39 y.o. male with a PMHx HTN and bilateral BKA who presents to the Emergency Department complaining of constant, ongoing abdominal pain x 3 days. Pt also reports diaphoresis and associated nausea/vomiting. At this time, pt is unable to keep any fluids or solid foods down. No aggravating or alleviating factors at this time. Prescribed Zofran, Reglan, and Phenergan attempted prior to arrival without any relief. No recent fever, chills, diarrhea, chest pain, shortness of breath, or cough. Family states pt typically has episodes of similar set of symptoms every so often throughout the year. Pt is not currently on dialysis and does not currently take any medications for history of HTN. PSHx includes combined kidney-pancreas transplant in 2014.    Past Medical History  Diagnosis Date  . Hypertension   . GERD (gastroesophageal reflux disease)   . Gastropathy   . Chronic pain   . Depression   . Edema   . Headache(784.0)   . 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 Dialysis patient [facility]) 04/18/12    "[facility]; Tues, [place], [place]"  . Blood transfusion   . Arthritis     "I think I do; just in my fingers & my hands"  . 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 Gastroparesis   . ESRD (end stage renal disease) on dialysis [facility])    Past Surgical History  Procedure Laterality Date  . Below knee leg amputation  "it's been awhile"    bilaterally  . Cataract extraction  ~ 2011    right  . Av fistula  placement  08/2011    left upper arm  . Combined kidney-pancreas transplant     Family History  Problem Relation Age of Onset  . Diabetes Other   . Hypertension Other    Social History  Substance Use Topics  . Smoking status: Never Smoker   . Smokeless tobacco: Never Used  . Alcohol Use: No    Review of Systems  Constitutional: Positive for diaphoresis. Negative for fever and chills.  Respiratory: Negative for shortness of breath.   Cardiovascular: Negative for chest pain.  Gastrointestinal: Positive for nausea, vomiting and abdominal pain. Negative for diarrhea.  Musculoskeletal: Negative for back pain.  Neurological: Negative for headaches.  Psychiatric/Behavioral: Negative for confusion.  All other systems reviewed and are negative.     Allergies  Review of patient's allergies indicates no known allergies.  Home Medications   Prior to Admission medications   Medication Sig Start Date End Date Taking? Authorizing Provider  aspirin EC 81 MG tablet Take 81 mg by mouth daily.    Historical Provider, MD  cyclobenzaprine (FLEXERIL) 10 MG tablet Take 1 tablet (10 mg total) by mouth 2 (two) times daily as needed for muscle spasms. Patient not taking: Reported on 09/30/2015 01/08/15   Gareth Morgan, MD  metoCLOPramide (REGLAN) 10 MG tablet Take 1 tablet (10 mg total) by mouth every 6 (six) hours. Patient taking differently: Take 10 mg  by mouth every 6 (six) hours as needed for nausea or vomiting.  09/13/15   Dalia Heading, PA-C  mycophenolate (MYFORTIC) 180 MG EC tablet Take 360 mg by mouth 2 (two) times daily.  07/06/13   Historical Provider, MD  omeprazole (PRILOSEC) 20 MG capsule Take 1 capsule (20 mg total) by mouth daily as needed (heartburn). 06/06/14   Linton Flemings, MD  ondansetron (ZOFRAN ODT) 4 MG disintegrating tablet Take 1 tablet (4 mg total) by mouth every 8 (eight) hours as needed for nausea or vomiting. 01/08/15   Gareth Morgan, MD  predniSONE (DELTASONE) 5 MG  tablet Take 5 mg by mouth daily.  06/20/13   Historical Provider, MD  sulfamethoxazole-trimethoprim (BACTRIM,SEPTRA) 400-80 MG per tablet Take 1 tablet by mouth every Monday, Wednesday, and Friday.     Historical Provider, MD  tacrolimus (PROGRAF) 1 MG capsule Take 3-4 mg by mouth 2 (two) times daily. 4mg  in the morning, 3mg  in the evening 07/16/13   Historical Provider, MD   Triage Vitals: BP 162/106 mmHg  Pulse 105  Temp(Src) 98.5 F (36.9 C) (Oral)  Resp 16  SpO2 100%   Physical Exam  CONSTITUTIONAL: Ill appearing. Smells of ketones  HEAD: Normocephalic/atraumatic EYES: EOMI/PERRL. No icterus  ENMT: Mucous membranes dry NECK: supple no meningeal signs SPINE/BACK:entire spine nontender CV: S1/S2 noted, no murmurs/rubs/gallops noted LUNGS: Lungs are clear to auscultation bilaterally, no apparent distress ABDOMEN: soft, moderate diffuse tenderness, no rebound or guarding, bowel sounds noted throughout abdomen GU:no cva tenderness NEURO: Pt is awake/alert/appropriate, moves all extremitiesx4.  No facial droop.   EXTREMITIES: pulses normal/equal, full ROM. Bilateral BKA noted SKIN: warm, color normal PSYCH: Anxious   ED Course  Procedures  Medications  sodium chloride 0.9 % bolus 1,000 mL (not administered)  HYDROmorphone (DILAUDID) injection 1 mg (1 mg Intravenous Given 12/16/15 0040)  ondansetron (ZOFRAN) injection 4 mg (4 mg Intravenous Given 12/16/15 0030)  sodium chloride 0.9 % bolus 1,000 mL (0 mLs Intravenous Stopped 12/16/15 0122)  sodium chloride 0.9 % bolus 1,000 mL (0 mLs Intravenous Stopped 12/16/15 0322)  sodium chloride 0.9 % bolus 1,000 mL (0 mLs Intravenous Stopped 12/16/15 0525)     DIAGNOSTIC STUDIES: Oxygen Saturation is 100% on RA, Normal by my interpretation.    COORDINATION OF CARE: 12:11 AM- Will give Dilaudid, Zofran, and fluids. Will order CT abdomen pelvis without contrast, CBG, CMP, CBC, urinalysis, and EKG. Discussed treatment plan with pt at  bedside and pt agreed to plan.    2:23 AM Pt improved CT does not reveal any acute pathology He reports he usually gets up to 4LNS for his dehydration He is dehydrated today His HGB/HCT are elevated, similar to prior presentations and likely secondary to dehydration Will try PO challenge He has not urinated as of yet 5:43 AM Pt improved No vomiting He feels well for d/c home We discussed return precautions BP 164/103 mmHg  Pulse 81  Temp(Src) 98.5 F (36.9 C) (Oral)  Resp 14  SpO2 100%  Labs Review Labs Reviewed  COMPREHENSIVE METABOLIC PANEL - Abnormal; Notable for the following:    Chloride 98 (*)    Glucose, Bld 111 (*)    Creatinine, Ser 1.69 (*)    Albumin 3.3 (*)    ALT 11 (*)    GFR calc non Af Amer 49 (*)    GFR calc Af Amer 57 (*)    All other components within normal limits  CBC - Abnormal; Notable for the following:    RBC  6.39 (*)    Hemoglobin 20.3 (*)    HCT 61.6 (*)    All other components within normal limits  URINALYSIS, ROUTINE W REFLEX MICROSCOPIC (NOT AT [facility]) - Abnormal; Notable for the following:    Ketones, ur 15 (*)    All other components within normal limits  CBG MONITORING, ED - Abnormal; Notable for the following:    Glucose-Capillary 110 (*)    All other components within normal limits  LIPASE, BLOOD    Imaging Review Ct Abdomen Pelvis Wo Contrast  12/16/2015  CLINICAL DATA:  39 year old male with nausea and vomiting x3 days. History of kidney and pancreatic transplant. EXAM: CT ABDOMEN AND PELVIS WITHOUT CONTRAST TECHNIQUE: Multidetector CT imaging of the abdomen and pelvis was performed following the standard protocol without IV contrast. COMPARISON:  CT dated 01/08/2015 FINDINGS: Evaluation of this exam is limited in the absence of intravenous contrast. The visualized lung bases are clear. No intra-abdominal free air or free fluid. The liver, gallbladder, pancreas, spleen, and the adrenal glands appear unremarkable. A transplanted  pancreas noted in the anterior upper abdomen. There is moderate bilateral renal atrophy. There is no hydronephrosis. The visualized ureters and urinary bladder appear unremarkable. The right lower quadrant transplant kidney is noted. There is no hydronephrosis or nephrolithiasis of transplant kidney. No peritransplant fluid collection identified. The prostate and seminal vesicles are grossly unremarkable. Evaluation of the bowel is limited in the absence of oral contrast. There is postsurgical changes of the bowel with anastomotic sutures. There is apparent thickening of the wall of the stomach which may be related to underdistention. Gastritis is not excluded. Clinical correlation is recommended. No evidence of bowel obstruction. Normal appendix. Moderate stool noted in the distal colon. Mild aortoiliac atherosclerotic disease. There is extensive calcification of the mesentery vasculature as well as renal arteries. No portal venous gas identified. There is no adenopathy. Midline vertical anterior abdominal wall incisional scar. There is diastases of anterior abdominal wall musculature in the midline with a small umbilical hernia. There is abutment of loops of bowel to the anterior abdominal wall along the incisional scar compatible with adhesions. The abdominal wall soft tissues appear unremarkable. The osseous structures are intact. IMPRESSION: Apparent diffuse thickening of the gastric wall possibly related to underdistention. Gastritis is less likely. Clinical correlation is recommended. No evidence of bowel obstruction. Normal appendix. Unremarkable right lower quadrant transplant kidney. Electronically Signed   By: Anner Crete M.D.   On: 12/16/2015 01:52   I have personally reviewed and evaluated these  lab results as part of my medical decision-making.   EKG Interpretation   Date/Time:  Wednesday December 16 2015 00:47:22 EST Ventricular Rate:  88 PR Interval:  160 QRS Duration: 79 QT  Interval:  365 QTC Calculation: 442 R Axis:   -77 Text Interpretation:  Sinus rhythm Consider right atrial enlargement Left  anterior fascicular block Anterior infarct, old Baseline wander in lead(s)  I V1 V2 V3 No significant change since last tracing Confirmed by Christy Gentles   MD, [place] ([id number]) on 12/16/2015 12:52:38 AM      MDM   Final diagnoses:  Dehydration  Generalized abdominal pain  Renal insufficiency    Nursing notes including past medical history and social history reviewed and considered in documentation xrays/imaging reviewed by myself and considered during evaluation Labs/vital reviewed myself and considered during evaluation   I personally performed the services described in this documentation, which was scribed in my presence. The recorded information has been reviewed and is  accurate.       Ripley Fraise, MD 12/16/15 [phone]

## 2015-12-16 ENCOUNTER — Emergency Department (HOSPITAL_COMMUNITY): Payer: Medicare Other

## 2015-12-16 ENCOUNTER — Inpatient Hospital Stay (HOSPITAL_COMMUNITY)
Admission: EM | Admit: 2015-12-16 | Discharge: 2015-12-17 | DRG: 392 | Disposition: A | Discharge status: Home / Self Care | Payer: Medicare Other | Attending: Family Medicine | Admitting: Family Medicine

## 2015-12-16 ENCOUNTER — Encounter (HOSPITAL_COMMUNITY): Payer: Self-pay | Admitting: *Deleted

## 2015-12-16 DIAGNOSIS — K3184 Gastroparesis: Secondary | ICD-10-CM | POA: Diagnosis present

## 2015-12-16 DIAGNOSIS — Z9483 Pancreas transplant status: Secondary | ICD-10-CM | POA: Diagnosis not present

## 2015-12-16 DIAGNOSIS — F4310 Post-traumatic stress disorder, unspecified: Secondary | ICD-10-CM | POA: Diagnosis not present

## 2015-12-16 DIAGNOSIS — K219 Gastro-esophageal reflux disease without esophagitis: Secondary | ICD-10-CM | POA: Diagnosis not present

## 2015-12-16 DIAGNOSIS — Z940 Kidney transplant status: Secondary | ICD-10-CM | POA: Diagnosis not present

## 2015-12-16 DIAGNOSIS — R109 Unspecified abdominal pain: Secondary | ICD-10-CM | POA: Diagnosis present

## 2015-12-16 DIAGNOSIS — R1115 Cyclical vomiting syndrome unrelated to migraine: Secondary | ICD-10-CM

## 2015-12-16 DIAGNOSIS — Z89512 Acquired absence of left leg below knee: Secondary | ICD-10-CM | POA: Diagnosis not present

## 2015-12-16 DIAGNOSIS — G8929 Other chronic pain: Secondary | ICD-10-CM | POA: Diagnosis not present

## 2015-12-16 DIAGNOSIS — F329 Major depressive disorder, single episode, unspecified: Secondary | ICD-10-CM | POA: Diagnosis not present

## 2015-12-16 DIAGNOSIS — E785 Hyperlipidemia, unspecified: Secondary | ICD-10-CM | POA: Diagnosis present

## 2015-12-16 DIAGNOSIS — I10 Essential (primary) hypertension: Secondary | ICD-10-CM | POA: Diagnosis present

## 2015-12-16 DIAGNOSIS — F1290 Cannabis use, unspecified, uncomplicated: Secondary | ICD-10-CM | POA: Diagnosis not present

## 2015-12-16 DIAGNOSIS — G43A1 Cyclical vomiting, intractable: Secondary | ICD-10-CM | POA: Diagnosis present

## 2015-12-16 DIAGNOSIS — Z7952 Long term (current) use of systemic steroids: Secondary | ICD-10-CM | POA: Diagnosis not present

## 2015-12-16 DIAGNOSIS — Z89511 Acquired absence of right leg below knee: Secondary | ICD-10-CM | POA: Diagnosis not present

## 2015-12-16 DIAGNOSIS — Z7982 Long term (current) use of aspirin: Secondary | ICD-10-CM | POA: Diagnosis not present

## 2015-12-16 DIAGNOSIS — Z9841 Cataract extraction status, right eye: Secondary | ICD-10-CM | POA: Diagnosis not present

## 2015-12-16 DIAGNOSIS — Z79899 Other long term (current) drug therapy: Secondary | ICD-10-CM | POA: Diagnosis not present

## 2015-12-16 DIAGNOSIS — Z8639 Personal history of other endocrine, nutritional and metabolic disease: Secondary | ICD-10-CM | POA: Diagnosis not present

## 2015-12-16 DIAGNOSIS — K2970 Gastritis, unspecified, without bleeding: Secondary | ICD-10-CM | POA: Diagnosis not present

## 2015-12-16 DIAGNOSIS — F339 Major depressive disorder, recurrent, unspecified: Secondary | ICD-10-CM | POA: Diagnosis present

## 2015-12-16 DIAGNOSIS — K920 Hematemesis: Secondary | ICD-10-CM | POA: Diagnosis present

## 2015-12-16 DIAGNOSIS — M1990 Unspecified osteoarthritis, unspecified site: Secondary | ICD-10-CM | POA: Diagnosis not present

## 2015-12-16 DIAGNOSIS — E860 Dehydration: Secondary | ICD-10-CM | POA: Diagnosis not present

## 2015-12-16 LAB — CBC
HCT: 61.6 % — ABNORMAL HIGH (ref 39.0–52.0)
HEMOGLOBIN: 20.3 g/dL — AB (ref 13.0–17.0)
MCH: 31.8 pg (ref 26.0–34.0)
MCHC: 33 g/dL (ref 30.0–36.0)
MCV: 96.4 fL (ref 78.0–100.0)
PLATELETS: 229 10*3/uL (ref 150–400)
RBC: 6.39 MIL/uL — AB (ref 4.22–5.81)
RDW: 14.1 % (ref 11.5–15.5)
WBC: 6.7 10*3/uL (ref 4.0–10.5)

## 2015-12-16 LAB — URINALYSIS, ROUTINE W REFLEX MICROSCOPIC
Bilirubin Urine: NEGATIVE
Glucose, UA: NEGATIVE mg/dL
Hgb urine dipstick: NEGATIVE
Ketones, ur: 15 mg/dL — AB
LEUKOCYTES UA: NEGATIVE
NITRITE: NEGATIVE
PROTEIN: NEGATIVE mg/dL
SPECIFIC GRAVITY, URINE: 1.011 (ref 1.005–1.030)
pH: 6.5 (ref 5.0–8.0)

## 2015-12-16 LAB — COMPREHENSIVE METABOLIC PANEL
ALK PHOS: 117 U/L (ref 38–126)
ALK PHOS: 96 U/L (ref 38–126)
ALT: 11 U/L — ABNORMAL LOW (ref 17–63)
ALT: 12 U/L — AB (ref 17–63)
ANION GAP: 11 (ref 5–15)
AST: 18 U/L (ref 15–41)
AST: 23 U/L (ref 15–41)
Albumin: 3.1 g/dL — ABNORMAL LOW (ref 3.5–5.0)
Albumin: 3.3 g/dL — ABNORMAL LOW (ref 3.5–5.0)
Anion gap: 12 (ref 5–15)
BUN: 13 mg/dL (ref 6–20)
BUN: 16 mg/dL (ref 6–20)
CALCIUM: 8.9 mg/dL (ref 8.9–10.3)
CALCIUM: 9.5 mg/dL (ref 8.9–10.3)
CHLORIDE: 102 mmol/L (ref 101–111)
CO2: 22 mmol/L (ref 22–32)
CO2: 26 mmol/L (ref 22–32)
CREATININE: 1.65 mg/dL — AB (ref 0.61–1.24)
Chloride: 98 mmol/L — ABNORMAL LOW (ref 101–111)
Creatinine, Ser: 1.69 mg/dL — ABNORMAL HIGH (ref 0.61–1.24)
GFR calc Af Amer: 59 mL/min — ABNORMAL LOW (ref >60)
GFR, EST AFRICAN AMERICAN: 57 mL/min — AB (ref >60)
GFR, EST NON AFRICAN AMERICAN: 49 mL/min — AB (ref >60)
GFR, EST NON AFRICAN AMERICAN: 50 mL/min — AB (ref >60)
Glucose, Bld: 111 mg/dL — ABNORMAL HIGH (ref 65–99)
Glucose, Bld: 83 mg/dL (ref 65–99)
Potassium: 4.6 mmol/L (ref 3.5–5.1)
Potassium: 4.9 mmol/L (ref 3.5–5.1)
SODIUM: 135 mmol/L (ref 135–145)
Sodium: 136 mmol/L (ref 135–145)
TOTAL PROTEIN: 6.6 g/dL (ref 6.5–8.1)
Total Bilirubin: 1.1 mg/dL (ref 0.3–1.2)
Total Bilirubin: 1.8 mg/dL — ABNORMAL HIGH (ref 0.3–1.2)
Total Protein: 6.5 g/dL (ref 6.5–8.1)

## 2015-12-16 LAB — CBC WITH DIFFERENTIAL/PLATELET
BASOS ABS: 0 10*3/uL (ref 0.0–0.1)
Basophils Relative: 1 %
Eosinophils Absolute: 0.1 10*3/uL (ref 0.0–0.7)
Eosinophils Relative: 1 %
HCT: 58.8 % — ABNORMAL HIGH (ref 39.0–52.0)
HEMOGLOBIN: 19.3 g/dL — AB (ref 13.0–17.0)
LYMPHS ABS: 0.8 10*3/uL (ref 0.7–4.0)
LYMPHS PCT: 13 %
MCH: 31.4 pg (ref 26.0–34.0)
MCHC: 32.8 g/dL (ref 30.0–36.0)
MCV: 95.8 fL (ref 78.0–100.0)
Monocytes Absolute: 0.9 10*3/uL (ref 0.1–1.0)
Monocytes Relative: 13 %
NEUTROS ABS: 4.6 10*3/uL (ref 1.7–7.7)
NEUTROS PCT: 73 %
PLATELETS: 197 10*3/uL (ref 150–400)
RBC: 6.14 MIL/uL — AB (ref 4.22–5.81)
RDW: 13.9 % (ref 11.5–15.5)
WBC: 6.3 10*3/uL (ref 4.0–10.5)

## 2015-12-16 LAB — CBG MONITORING, ED: Glucose-Capillary: 76 mg/dL (ref 65–99)

## 2015-12-16 LAB — LIPASE, BLOOD
LIPASE: 30 U/L (ref 11–51)
Lipase: 29 U/L (ref 11–51)

## 2015-12-16 MED ORDER — LORAZEPAM 2 MG/ML IJ SOLN
0.5000 mg | Freq: Once | INTRAMUSCULAR | Status: AC
  Administered 2015-12-16: 0.5 mg via INTRAVENOUS
  Filled 2015-12-16: qty 1

## 2015-12-16 MED ORDER — ONDANSETRON HCL 4 MG/2ML IJ SOLN
4.0000 mg | Freq: Once | INTRAMUSCULAR | Status: AC
  Administered 2015-12-16: 4 mg via INTRAVENOUS
  Filled 2015-12-16: qty 2

## 2015-12-16 MED ORDER — SODIUM CHLORIDE 0.9 % IV BOLUS (SEPSIS)
1000.0000 mL | Freq: Once | INTRAVENOUS | Status: AC
  Administered 2015-12-16: 1000 mL via INTRAVENOUS

## 2015-12-16 MED ORDER — LORAZEPAM 2 MG/ML IJ SOLN: 1.0000 mg | Freq: Once | INTRAMUSCULAR | Status: DC

## 2015-12-16 MED ORDER — SODIUM CHLORIDE 0.9 % IV BOLUS (SEPSIS)
2000.0000 mL | Freq: Once | INTRAVENOUS | Status: AC
  Administered 2015-12-16: 2000 mL via INTRAVENOUS

## 2015-12-16 MED ORDER — SODIUM CHLORIDE 0.9 % IV BOLUS (SEPSIS): 1000.0000 mL | Freq: Once | INTRAVENOUS | Status: DC

## 2015-12-16 MED ORDER — SODIUM CHLORIDE 0.9 % IV SOLN
INTRAVENOUS | Status: DC
  Administered 2015-12-17 (×2): via INTRAVENOUS

## 2015-12-16 MED ORDER — HYDROMORPHONE HCL 1 MG/ML IJ SOLN
1.0000 mg | Freq: Once | INTRAMUSCULAR | Status: AC
  Administered 2015-12-16: 1 mg via INTRAVENOUS
  Filled 2015-12-16: qty 1

## 2015-12-16 MED ORDER — PROMETHAZINE HCL 25 MG/ML IJ SOLN: 25.0000 mg | Freq: Once | INTRAMUSCULAR | Status: DC

## 2015-12-16 MED ORDER — PROMETHAZINE HCL 25 MG/ML IJ SOLN
12.5000 mg | Freq: Once | INTRAMUSCULAR | Status: AC
  Administered 2015-12-16: 12.5 mg via INTRAVENOUS
  Filled 2015-12-16: qty 1

## 2015-12-16 NOTE — ED Notes (Signed)
Pt states that he feels better and that he is ready to go home.

## 2015-12-16 NOTE — ED Notes (Signed)
Christy Gentles, MD at bedside.

## 2015-12-16 NOTE — Discharge Instructions (Signed)

## 2015-12-16 NOTE — ED Notes (Signed)
Pt desatted to 84% with a good wave form. Pt was able to bring sats up on his own, but this RN placed the pt on 2L nasal cannula anyway. Pt states that it hurts his stomach to breathe deeply, and this RN observed him for a short period taking shallow breaths. Pt's visitor also states that the pt has poor peripheral vasculature, and the pulse ox sometimes has trouble picking up his sat.

## 2015-12-16 NOTE — ED Notes (Signed)
Christy Gentles, MD notified of the pt's desaturation. No new orders at this time.

## 2015-12-16 NOTE — ED Notes (Signed)
Pt is tolerating PO fluids.

## 2015-12-16 NOTE — ED Notes (Signed)
Patient presents stating he was here on the 20th for the same but now has noticed coffee grounds in the emesis

## 2015-12-16 NOTE — ED Notes (Signed)
Pt given water as a fluid challenge.

## 2015-12-16 NOTE — ED Provider Notes (Signed)
CSN: [id number]     Arrival date & time 12/16/15  2220 History   First MD Initiated Contact with Patient 12/16/15 2225     Chief Complaint  Patient presents with  . Emesis     (Consider location/radiation/quality/duration/timing/severity/associated sxs/prior Treatment) HPI Comments: Patient here with persistent abdominal discomfort with associated nausea and vomiting with some coffee grounds. Review the patient's old chart shows that he was discharged from here this morning after he was evaluated for similar symptoms. Patient had an abdominal CT at that time which did not show any evidence of acute pathology. He was hydrated with normal saline. States he felt better initially when he went home but then his symptoms returned. Denies any fever or chills. No black or bloody stools. No diarrhea noted. Abdominal discomfort is diffuse and characterized as crampy. Patient did uses Zofran prior to arrival which did not improve his symptoms. l. Does have a history of gastroparesis.  Patient is a 39 y.o. Caldwell presenting with vomiting. The history is provided by the patient.  Emesis   Past Medical History  Diagnosis Date  . Hypertension   . GERD (gastroesophageal reflux disease)   . Gastropathy   . Chronic pain   . Depression   . Edema   . Headache(784.0)   . 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 Dialysis patient [facility]) 04/18/12    "[facility]; Tues, [place], [place]"  . Blood transfusion   . Arthritis     "I think I do; just in my fingers & my hands"  . 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 Gastroparesis   . ESRD (end stage renal disease) on dialysis [facility])    Past Surgical History  Procedure Laterality Date  . Below knee leg amputation  "it's been awhile"    bilaterally  . Cataract extraction  ~ 2011    right  . Av fistula placement  08/2011    left upper arm  . Combined kidney-pancreas transplant     Family History  Problem Relation Age of Onset  .  Diabetes Other   . Hypertension Other    Social History  Substance Use Topics  . Smoking status: Never Smoker   . Smokeless tobacco: Never Used  . Alcohol Use: No    Review of Systems  Gastrointestinal: Positive for vomiting.  All other systems reviewed and are negative.     Allergies  Review of patient's allergies indicates no known allergies.  Home Medications   Prior to Admission medications   Medication Sig Start Date End Date Taking? Authorizing Provider  aspirin EC 81 MG tablet Take 81 mg by mouth daily.    Historical Provider, MD  cyclobenzaprine (FLEXERIL) 10 MG tablet Take 1 tablet (10 mg total) by mouth 2 (two) times daily as needed for muscle spasms. Patient not taking: Reported on 09/30/2015 01/08/15   Gareth Morgan, MD  metoCLOPramide (REGLAN) 10 MG tablet Take 1 tablet (10 mg total) by mouth every 6 (six) hours. Patient not taking: Reported on 12/16/2015 09/13/15   Dalia Heading, PA-C  mycophenolate (MYFORTIC) 180 MG EC tablet Take 360 mg by mouth 2 (two) times daily.  07/06/13   Historical Provider, MD  omeprazole (PRILOSEC) 20 MG capsule Take 1 capsule (20 mg total) by mouth daily as needed (heartburn). 06/06/14   Linton Flemings, MD  ondansetron (ZOFRAN ODT) 4 MG disintegrating tablet Take 1 tablet (4 mg total) by mouth every 8 (eight) hours as needed  for nausea or vomiting. 01/08/15   Gareth Morgan, MD  predniSONE (DELTASONE) 5 MG tablet Take 5 mg by mouth daily.  06/20/13   Historical Provider, MD  sulfamethoxazole-trimethoprim (BACTRIM,SEPTRA) 400-80 MG per tablet Take 1 tablet by mouth every Monday, Wednesday, and Friday.     Historical Provider, MD  tacrolimus (PROGRAF) 1 MG capsule Take 3-4 mg by mouth 2 (two) times daily. 4mg  in the morning, 3mg  in the evening 07/16/13   Historical Provider, MD   BP 170/109 mmHg  Pulse 104  Temp(Src) 98.2 F (36.8 C) (Oral)  Resp 24  Ht 6\' 1"  (1.854 m)  Wt 73.936 kg  BMI 21.51 kg/m2  SpO2 99% Physical Exam   Constitutional: He is oriented to person, place, and time. He appears well-developed and well-nourished.  Non-toxic appearance. No distress.  HENT:  Head: Normocephalic and atraumatic.  Eyes: Conjunctivae, EOM and lids are normal. Pupils are equal, round, and reactive to light.  Neck: Normal range of motion. Neck supple. No tracheal deviation present. No thyroid mass present.  Cardiovascular: Normal rate, regular rhythm and normal heart sounds.  Exam reveals no gallop.   No murmur heard. Pulmonary/Chest: Effort normal and breath sounds normal. No stridor. No respiratory distress. He has no decreased breath sounds. He has no wheezes. He has no rhonchi. He has no rales.  Abdominal: Soft. Normal appearance and bowel sounds are normal. He exhibits no distension. There is no tenderness. There is no rebound and no CVA tenderness.  Musculoskeletal: Normal range of motion. He exhibits no edema or tenderness.  Neurological: He is alert and oriented to person, place, and time. He has normal strength. No cranial nerve deficit or sensory deficit. GCS eye subscore is 4. GCS verbal subscore is 5. GCS motor subscore is 6.  Skin: Skin is warm and dry. No abrasion and no rash noted.  Psychiatric: He has a normal mood and affect. His speech is normal and behavior is normal.  Nursing note and vitals reviewed.   ED Course  Procedures (including critical care time) Labs Review Labs Reviewed  CBC WITH DIFFERENTIAL/PLATELET  COMPREHENSIVE METABOLIC PANEL  LIPASE, BLOOD  URINALYSIS, ROUTINE W REFLEX MICROSCOPIC (NOT AT [facility])  CBG MONITORING, ED    Imaging Review Ct Abdomen Pelvis Wo Contrast  12/16/2015  CLINICAL DATA:  39 year old Caldwell with nausea and vomiting x3 days. History of kidney and pancreatic transplant. EXAM: CT ABDOMEN AND PELVIS WITHOUT CONTRAST TECHNIQUE: Multidetector CT imaging of the abdomen and pelvis was performed following the standard protocol without IV contrast. COMPARISON:  CT dated  01/08/2015 FINDINGS: Evaluation of this exam is limited in the absence of intravenous contrast. The visualized lung bases are clear. No intra-abdominal free air or free fluid. The liver, gallbladder, pancreas, spleen, and the adrenal glands appear unremarkable. A transplanted pancreas noted in the anterior upper abdomen. There is moderate bilateral renal atrophy. There is no hydronephrosis. The visualized ureters and urinary bladder appear unremarkable. The right lower quadrant transplant kidney is noted. There is no hydronephrosis or nephrolithiasis of transplant kidney. No peritransplant fluid collection identified. The prostate and seminal vesicles are grossly unremarkable. Evaluation of the bowel is limited in the absence of oral contrast. There is postsurgical changes of the bowel with anastomotic sutures. There is apparent thickening of the wall of the stomach which may be related to underdistention. Gastritis is not excluded. Clinical correlation is recommended. No evidence of bowel obstruction. Normal appendix. Moderate stool noted in the distal colon. Mild aortoiliac atherosclerotic disease. There is  extensive calcification of the mesentery vasculature as well as renal arteries. No portal venous gas identified. There is no adenopathy. Midline vertical anterior abdominal wall incisional scar. There is diastases of anterior abdominal wall musculature in the midline with a small umbilical hernia. There is abutment of loops of bowel to the anterior abdominal wall along the incisional scar compatible with adhesions. The abdominal wall soft tissues appear unremarkable. The osseous structures are intact. IMPRESSION: Apparent diffuse thickening of the gastric wall possibly related to underdistention. Gastritis is less likely. Clinical correlation is recommended. No evidence of bowel obstruction. Normal appendix. Unremarkable right lower quadrant transplant kidney. Electronically Signed   By: Anner Crete M.D.    On: 12/16/2015 01:52   I have personally reviewed and evaluated these images and lab results as part of my medical decision-making.   EKG Interpretation None      MDM   Final diagnoses:  None    Patient given IV fluids anti-medics here. Still complains of severe cramping. We'll admit to the family practice service    Lacretia Leigh, MD 12/16/15 [phone]

## 2015-12-17 DIAGNOSIS — R1032 Left lower quadrant pain: Secondary | ICD-10-CM | POA: Diagnosis not present

## 2015-12-17 DIAGNOSIS — K920 Hematemesis: Secondary | ICD-10-CM | POA: Diagnosis present

## 2015-12-17 DIAGNOSIS — Z940 Kidney transplant status: Secondary | ICD-10-CM

## 2015-12-17 DIAGNOSIS — K219 Gastro-esophageal reflux disease without esophagitis: Secondary | ICD-10-CM | POA: Diagnosis not present

## 2015-12-17 DIAGNOSIS — I10 Essential (primary) hypertension: Secondary | ICD-10-CM

## 2015-12-17 DIAGNOSIS — G43A1 Cyclical vomiting, intractable: Secondary | ICD-10-CM | POA: Diagnosis not present

## 2015-12-17 DIAGNOSIS — K3184 Gastroparesis: Secondary | ICD-10-CM | POA: Diagnosis not present

## 2015-12-17 DIAGNOSIS — Z9483 Pancreas transplant status: Secondary | ICD-10-CM

## 2015-12-17 LAB — RAPID URINE DRUG SCREEN, HOSP PERFORMED
AMPHETAMINES: NOT DETECTED
BENZODIAZEPINES: NOT DETECTED
Barbiturates: NOT DETECTED
COCAINE: NOT DETECTED
OPIATES: POSITIVE — AB
TETRAHYDROCANNABINOL: POSITIVE — AB

## 2015-12-17 LAB — BASIC METABOLIC PANEL
ANION GAP: 11 (ref 5–15)
BUN: 14 mg/dL (ref 6–20)
CALCIUM: 8.5 mg/dL — AB (ref 8.9–10.3)
CO2: 20 mmol/L — AB (ref 22–32)
Chloride: 105 mmol/L (ref 101–111)
Creatinine, Ser: 1.38 mg/dL — ABNORMAL HIGH (ref 0.61–1.24)
Glucose, Bld: 73 mg/dL (ref 65–99)
Potassium: 4.3 mmol/L (ref 3.5–5.1)
Sodium: 136 mmol/L (ref 135–145)

## 2015-12-17 LAB — CBC
HCT: 54.8 % — ABNORMAL HIGH (ref 39.0–52.0)
HCT: 54.9 % — ABNORMAL HIGH (ref 39.0–52.0)
HEMOGLOBIN: 18 g/dL — AB (ref 13.0–17.0)
Hemoglobin: 17.6 g/dL — ABNORMAL HIGH (ref 13.0–17.0)
MCH: 31.7 pg (ref 26.0–34.0)
MCH: 31.1 pg (ref 26.0–34.0)
MCHC: 32.8 g/dL (ref 30.0–36.0)
MCHC: 32.1 g/dL (ref 30.0–36.0)
MCV: 96.5 fL (ref 78.0–100.0)
MCV: 97 fL (ref 78.0–100.0)
PLATELETS: 196 10*3/uL (ref 150–400)
Platelets: 195 10*3/uL (ref 150–400)
RBC: 5.68 MIL/uL (ref 4.22–5.81)
RBC: 5.66 MIL/uL (ref 4.22–5.81)
RDW: 14 % (ref 11.5–15.5)
RDW: 14 % (ref 11.5–15.5)
WBC: 5.5 10*3/uL (ref 4.0–10.5)
WBC: 5.1 10*3/uL (ref 4.0–10.5)

## 2015-12-17 LAB — URINALYSIS, ROUTINE W REFLEX MICROSCOPIC
Glucose, UA: NEGATIVE mg/dL
HGB URINE DIPSTICK: NEGATIVE
Ketones, ur: 40 mg/dL — AB
Leukocytes, UA: NEGATIVE
Nitrite: NEGATIVE
Protein, ur: NEGATIVE mg/dL
SPECIFIC GRAVITY, URINE: 1.015 (ref 1.005–1.030)
pH: 5.5 (ref 5.0–8.0)

## 2015-12-17 MED ORDER — MORPHINE SULFATE (PF) 2 MG/ML IV SOLN: 2.0000 mg | INTRAVENOUS | Status: DC | PRN

## 2015-12-17 MED ORDER — HYDRALAZINE HCL 20 MG/ML IJ SOLN
5.0000 mg | INTRAMUSCULAR | Status: DC | PRN
  Administered 2015-12-17: 5 mg via INTRAVENOUS
  Filled 2015-12-17: qty 1

## 2015-12-17 MED ORDER — TACROLIMUS 1 MG PO CAPS
3.0000 mg | ORAL_CAPSULE | Freq: Every day | ORAL | Status: DC
  Administered 2015-12-17: 3 mg via ORAL
  Filled 2015-12-17 (×2): qty 3

## 2015-12-17 MED ORDER — METOCLOPRAMIDE HCL 10 MG PO TABS
10.0000 mg | ORAL_TABLET | Freq: Four times a day (QID) | ORAL | Status: DC
  Administered 2015-12-17 (×2): 10 mg via ORAL
  Filled 2015-12-17 (×3): qty 1

## 2015-12-17 MED ORDER — PREDNISONE 5 MG PO TABS
5.0000 mg | ORAL_TABLET | Freq: Every day | ORAL | Status: DC
  Administered 2015-12-17: 5 mg via ORAL
  Filled 2015-12-17: qty 1

## 2015-12-17 MED ORDER — POLYETHYLENE GLYCOL 3350 17 G PO PACK
17.0000 g | PACK | Freq: Every day | ORAL | Status: DC | PRN
Start: 2015-12-17 — End: 2015-12-17

## 2015-12-17 MED ORDER — ACETAMINOPHEN 325 MG PO TABS: 650.0000 mg | ORAL_TABLET | Freq: Four times a day (QID) | ORAL | Status: DC | PRN

## 2015-12-17 MED ORDER — TACROLIMUS 1 MG PO CAPS
4.0000 mg | ORAL_CAPSULE | Freq: Every morning | ORAL | Status: DC
  Administered 2015-12-17: 4 mg via ORAL
  Filled 2015-12-17: qty 4

## 2015-12-17 MED ORDER — POLYETHYLENE GLYCOL 3350 17 G PO PACK: 17.0000 g | PACK | Freq: Every day | ORAL | Status: DC | PRN

## 2015-12-17 MED ORDER — MYCOPHENOLATE SODIUM 180 MG PO TBEC: 360.0000 mg | DELAYED_RELEASE_TABLET | Freq: Two times a day (BID) | ORAL | Status: DC

## 2015-12-17 MED ORDER — ACETAMINOPHEN 650 MG RE SUPP: 650.0000 mg | Freq: Four times a day (QID) | RECTAL | Status: DC | PRN

## 2015-12-17 MED ORDER — ONDANSETRON 4 MG PO TBDP
4.0000 mg | ORAL_TABLET | Freq: Three times a day (TID) | ORAL | Status: DC | PRN
  Filled 2015-12-17: qty 1

## 2015-12-17 MED ORDER — SULFAMETHOXAZOLE-TRIMETHOPRIM 400-80 MG PO TABS: 1.0000 | ORAL_TABLET | ORAL | Status: DC

## 2015-12-17 MED ORDER — MYCOPHENOLATE SODIUM 180 MG PO TBEC
360.0000 mg | DELAYED_RELEASE_TABLET | Freq: Two times a day (BID) | ORAL | Status: DC
  Administered 2015-12-17 (×2): 360 mg via ORAL
  Filled 2015-12-17 (×3): qty 2

## 2015-12-17 MED ORDER — PROMETHAZINE HCL 25 MG/ML IJ SOLN: 12.5000 mg | Freq: Four times a day (QID) | INTRAMUSCULAR | Status: DC | PRN

## 2015-12-17 MED ORDER — METOCLOPRAMIDE HCL 10 MG PO TABS: 10.0000 mg | ORAL_TABLET | Freq: Four times a day (QID) | ORAL | Status: DC | PRN

## 2015-12-17 MED ORDER — SODIUM CHLORIDE 0.9 % IV SOLN
80.0000 mg | Freq: Two times a day (BID) | INTRAVENOUS | Status: DC
  Administered 2015-12-17 (×2): 80 mg via INTRAVENOUS
  Filled 2015-12-17 (×3): qty 80

## 2015-12-17 NOTE — Discharge Instructions (Signed)
You were admitted for irretractable nausea and vomiting, which improved after admission with medications. I have sent a refill of Reglan to your pharmacy and a prescription for Miralax to take as needed for constipation. Please continue to take your Zofran as needed for Nausea. Please advance your diet slowly as tolerated; if you eat something that causes nausea, please try to avoid that food for several days. Please call the Gastroenterologist and schedule an appointment to be seen so you frequent episodes of nausea and vomiting can be further evaluated.

## 2015-12-17 NOTE — Discharge Summary (Signed)
[facility] Discharge Summary  Patient name: [name] Medical record number: [id number] Date of birth: November 08, 1976 Age: 39 y.o. Gender: male Date of Admission: 12/16/2015  Date of Discharge: 12/16/15 Admitting Physician: Dickie La, MD  Primary Care Provider: Cordelia Poche, MD Consultants: None  Indication for Hospitalization: Coffee Ground Emesis  Discharge Diagnoses/Problem List:  Coffee Ground Emesis Type 1 Diabetes s/p Pancreas Transplant ESRD s/p Renal Transplant HTN HLD GERD  Disposition: Discharge Home  Discharge Condition: Stable  Discharge Exam:  General: 39yo male resting comfortably in no apparent distress Cardiac: S1 and S2 noted, no murmurs noted Resp: Clear to auscultation bilaterally, no wheezing Abd: Soft and nondistended, bowel sounds noted, no tenderness or masses to palpation MSK: Bilateral BKA, moves all extremities  Brief Hospital Course:  [name] is a 39yo male who presented to the ED on 12/15/15 with intractable nausea and vomiting and one episode of coffee ground emesis. Was unable to tolerate PO intake. Creatinine elevated to 1.38, most likely due to dehydration. Hemoglobin stable at 19.3, elevated most likely secondary to renal transplant. UDS positive for THC and Opioids. CT abdomen showed diffuse thickening of the gastric wall, but no signs of obstruction; CT somewhat concerning for possible neoplasm given immunosuppression but may also be secondary to nondistended stomach from vomiting. Admitted to [facility] Medicine Teaching Service.  Initiated on clear liquid diet with IV fluids. IV Protonix and Phenergan were given. Home Zofran and Reglan were continued. After admission, nausea, vomiting, and abdominal pain resolved. No further episodes of coffee ground emesis occurred during hospitalization. Discussed case with Gastroenterology, who recommended outpatient follow up since patient is stable and symptoms seem to  have resolved. Hemoglobin stable at 17.6 prior to discharge. Discharged on 12/17/15 following improvement of symptoms and clinical stability.  Issues for Follow Up:  1. Check BMP for improvement of Creatinine. 1.38 at discharge. 2. UDS returned after discharge. Counsel on relationship between [facility] use and intractable vomiting. 3. Follow up with GI   Significant Procedures: None  Significant Labs and Imaging:   Recent Labs Lab 12/16/15 2250 12/17/15 0745 12/17/15 1140  WBC 6.3 5.5 5.1  HGB 19.3* 18.0* 17.6*  HCT 58.8* 54.8* 54.9*  PLT 197 195 196    Recent Labs Lab 12/15/15 2358 12/16/15 2250 12/17/15 0745  NA 135 136 136  K 4.6 4.9 4.3  CL 98* 102 105  CO2 26 22 20*  GLUCOSE 111* 83 73  BUN 16 13 14   CREATININE 1.69* 1.65* 1.38*  CALCIUM 9.5 8.9 8.5*  ALKPHOS 117 96  --   AST 18 23  --   ALT 11* 12*  --   ALBUMIN 3.3* 3.1*  --    -UDS positive for THC and Opioids.  Results/Tests Pending at Time of Discharge: None  Discharge Medications:    Medication List    TAKE these medications        aspirin EC 81 MG tablet  Take 81 mg by mouth daily.     cyclobenzaprine 10 MG tablet  Commonly known as:  FLEXERIL  Take 1 tablet (10 mg total) by mouth 2 (two) times daily as needed for muscle spasms.     metoCLOPramide 10 MG tablet  Commonly known as:  REGLAN  Take 1 tablet (10 mg total) by mouth every 6 (six) hours as needed for nausea.     MYFORTIC 180 MG EC tablet  Generic drug:  mycophenolate  Take 360 mg by mouth 2 (two) times  daily.     omeprazole 20 MG capsule  Commonly known as:  PRILOSEC  Take 1 capsule (20 mg total) by mouth daily as needed (heartburn).     ondansetron 4 MG disintegrating tablet  Commonly known as:  ZOFRAN ODT  Take 1 tablet (4 mg total) by mouth every 8 (eight) hours as needed for nausea or vomiting.     polyethylene glycol packet  Commonly known as:  MIRALAX / GLYCOLAX  Take 17 g by mouth daily as needed for mild constipation.      predniSONE 5 MG tablet  Commonly known as:  DELTASONE  Take 5 mg by mouth daily.     sulfamethoxazole-trimethoprim 400-80 MG tablet  Commonly known as:  BACTRIM,SEPTRA  Take 1 tablet by mouth every Monday, Wednesday, and Friday.     tacrolimus 1 MG capsule  Commonly known as:  PROGRAF  Take 3-4 mg by mouth 2 (two) times daily. 4mg  in the morning, 3mg  in the evening        Discharge Instructions: Please refer to Patient Instructions section of EMR for full details.  Patient was counseled important signs and symptoms that should prompt return to medical care, changes in medications, dietary instructions, activity restrictions, and follow up appointments.   Follow-Up Appointments:     Follow-up Information    Follow up with Cordelia Poche, MD. Schedule an appointment as soon as possible for a visit in 1 week.   Specialty:  Family Medicine   Why:  Hospital Follow Up   Contact information:   [place] [address] [phone]       Schedule an appointment as soon as possible for a visit with [facility] Gastroenterology.   Contact information:   [place] [address] [phone]       Lorna Few, DO 12/17/2015, 3:39 PM PGY-2, [place]

## 2015-12-17 NOTE — Care Management Note (Signed)
Case Management Note  Patient Details  Name: [name] MRN: [id number] Date of Birth: 02-08-1976  Subjective/Objective:   Patient is for dc today, no needs.                 Action/Plan:   Expected Discharge Date:                  Expected Discharge Plan:  Home/Self Care  In-House Referral:     Discharge planning Services  CM Consult  Post Acute Care Choice:    Choice offered to:     DME Arranged:    DME Agency:     HH Arranged:    [place] Agency:     Status of Service:  Completed, signed off  Medicare Important Message Given:    Date Medicare IM Given:    Medicare IM give by:    Date Additional Medicare IM Given:    Additional Medicare Important Message give by:     If discussed at [place] of Stay Meetings, dates discussed:    Additional Comments:  Zenon Mayo, RN 12/17/2015, 1:47 PM

## 2015-12-17 NOTE — Progress Notes (Signed)
Nsg Discharge Note  Admit Date:  12/16/2015 Discharge date: 12/17/2015   [name] to be D/C'd home per MD order.  AVS completed.  Copy for chart, and copy for patient signed, and dated. Patien able to verbalize understanding. No questions or concerns voiced when asked.   Discharge Medication:   Medication List    TAKE these medications        aspirin EC 81 MG tablet  Take 81 mg by mouth daily.     cyclobenzaprine 10 MG tablet  Commonly known as:  FLEXERIL  Take 1 tablet (10 mg total) by mouth 2 (two) times daily as needed for muscle spasms.     metoCLOPramide 10 MG tablet  Commonly known as:  REGLAN  Take 1 tablet (10 mg total) by mouth every 6 (six) hours as needed for nausea.     MYFORTIC 180 MG EC tablet  Generic drug:  mycophenolate  Take 360 mg by mouth 2 (two) times daily.     omeprazole 20 MG capsule  Commonly known as:  PRILOSEC  Take 1 capsule (20 mg total) by mouth daily as needed (heartburn).     ondansetron 4 MG disintegrating tablet  Commonly known as:  ZOFRAN ODT  Take 1 tablet (4 mg total) by mouth every 8 (eight) hours as needed for nausea or vomiting.     polyethylene glycol packet  Commonly known as:  MIRALAX / GLYCOLAX  Take 17 g by mouth daily as needed for mild constipation.     predniSONE 5 MG tablet  Commonly known as:  DELTASONE  Take 5 mg by mouth daily.     sulfamethoxazole-trimethoprim 400-80 MG tablet  Commonly known as:  BACTRIM,SEPTRA  Take 1 tablet by mouth every Monday, Wednesday, and Friday.     tacrolimus 1 MG capsule  Commonly known as:  PROGRAF  Take 3-4 mg by mouth 2 (two) times daily. 4mg  in the morning, 3mg  in the evening        Skin clean, dry and intact without evidence of skin break down, no evidence of skin tears noted. Pt had dry skin at BLE leg stump. IV catheter discontinued intact. Site without signs and symptoms of complications - no redness or edema noted at insertion site, patient denies c/o pain or  discomfort.  Dressing with slight pressure applied.  D/c Instructions-Education: Discharge instructions given to patient with verbalized understanding. D/c education completed with patient including follow up instructions, medication list, d/c activities limitations as  indicated, with other d/c instructions as indicated by MD - patient able to verbalize understanding, all questions fully answered. Patient instructed to return to ED, call 911, or call MD for any changes in condition.  Patient in room waiting for his ride at this time. Pt informed to call desk when ride has arrived to facility. Pt verbalized understanding. Bed low and locked and call bell within reach.   Dorita Fray, RN 12/17/2015 2:10 PM

## 2015-12-17 NOTE — H&P (Signed)
[facility] Admission History and Physical Service Pager: [phone]  Patient name: [name] Medical record number: [id number] Date of birth: 06/26/1976 Age: 39 y.o. Gender: male  Primary Care Provider: Cordelia Poche, MD Consultants: none Code Status: FULL  Chief Complaint: Intractable N/V  Assessment and Plan: [name] is a 39 y.o. male presenting with intractable nausea and vomiting. PMH is significant for DM 1(now resolved s/p pancreatic transplant), history of ESRD (now status post kidney transplant in June 2014), HTN, GERD, HLD, gastroparesis, depression, and bilateral amputations.  Intractable nausea, vomiting (coffee grounds), abdominal pain: Pt with recurrent history and multiple admissions for same complaint. Most likely etiology of intractable N/V is gastroparesis. CT abd obtained yesterday showed diffuse thickening of gastric wall but no obstruction. Concern for possible slow upper GI bleed or lower GI bleed with Coffee-ground emesis. Possibly explained by frequent vomiting causing Mallory Weiss tears, especially given onset after two days of vomiting. Gastritis is another likely etiology of GI Bleed, especially in setting of chronic steroid use. - Admit to [facility], attending Dr. Nori Riis - Gastroccult ordered - Clear liquid diet, NS@125  mL/hr - IV PPI - IV Phenergen PRN - Morphine 2 mg q2 PRN - Cont home Zofran and Reglan - Consider GI consult in AM for possible endoscopy - monitor hemoglobin - repeat CBC in AM - hold VTE ppx anticoag and home aspirin in setting of potential active bleed - 2 large bore IVs in place - UA and UDS pending  H/o T1DM and ESRD: S/p pancreatic and kidney transplant in 2014. S/p bilateral BKAs. Followed by [facility]. Cr slightly elevated at 1.65 (baseline 1.4). Hgb 19 (near baseline). UA with ketones but no glucosuria. CBG on admission 76. - Continue home immunosuppressive medications -  mycophenalate, tacrolimus, prednisone, prophylactic bactrim  Hypertension: Currently elevated but most likely 2/2 illness and pain. Pt reports BP is only elevated when in the hospital, and BP meds were discontinued by his nephrologist over a year ago.   - Monitor BPs closely, especially in setting of possible GI bleed - PRN IV hydralazine 5mg  for sBP >180 or dBP >110  HLD: not currently on statin at home - Hold ASA in setting of possible active bleed  GERD:  - Hold home omeprazole while on IV PPI  FEN/GI: clear liquids, NS@125  mL/hr Prophylaxis: no VTE prophylaxis in setting of possible active bleed; cannot place SCDs since bilateral BKAs   Disposition: Admit to FPTS, d/c pending workup for possible GI Bleed and improvement in symptoms  History of Present Illness:  [name] is a 39 y.o. male presenting with intractable nausea and vomiting for three days.   Patient presented to the ED for same symptoms yesterday, and was discharged after IV hydration with NS. CT abdomen showed no acute pathology. Patient reported feeling well enough to go home at the time, but now says that his symptoms have returned. He also now reports coffee ground emesis, which was not present before. Tried to eat chicken broth today, but immediately vomited.   Patient also reporting diffuse abdominal pain for two to three days. Describes the pain as sharp and burning, and originally constant but now intermittent. Located mostly in LLQ with radiation to back. Improved with administration of Dilaudid and phenergan in ED. Denies constipation or diarrhea. Last bowel movement was a couple hours ago. Patient denies black or bloody stools.   Reports dark urine but says this has resolved since he's been getting IVF in  ED. Denies dysuria, increased urinary frequency, or urgency.   Review Of Systems: Per HPI with the following additions: denies fevers, endorses two episodes of diaphoresis Otherwise the remainder of the  systems were negative.  Patient Active Problem List   Diagnosis Date Noted  . Coffee ground emesis 12/17/2015  . Gastroparesis 12/16/2015  . Back pain 12/15/2015  . Non-intractable vomiting with nausea 07/04/2015  . Nausea & vomiting 07/03/2015  . History of diabetes mellitus 02/20/2015  . Abdominal pain 09/10/2014  . Intractable nausea and vomiting 09/09/2014  . Stage II pressure ulcer 07/29/2014  . Weakness of left upper extremity 07/03/2014  . Impaired mobility and activities of daily living 07/03/2014  . Sternoclavicular joint pain 03/04/2014  . History of transplantation, renal 07/19/2013  . History of pancreas transplant ([place]) 07/19/2013  . Immunosuppressed status ([place]) 06/27/2013  . H/O pancreas transplant ([place]) 06/27/2013  . Diabetic gastroparesis associated with type 1 diabetes mellitus ([place]) 05/08/2013  . BKA stump complication ([place]) [id number]  . S/P bilateral BKA (below knee amputation) ([place]) 08/17/2012  . Diabetic gastroparesis ([place]) 07/20/2011  . Metabolic bone disease 99991111  . ESRD (end stage renal disease) ([place]) 11/17/2009  . GERD 03/14/2007  . HLD (hyperlipidemia) 02/22/2007  . Major depressive disorder, recurrent episode ([place]) 02/22/2007  . POST TRAUMATIC STRESS DISORDER 02/22/2007  . HYPERTENSION, BENIGN SYSTEMIC 02/22/2007  . 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 Hypertension   . GERD (gastroesophageal reflux disease)   . Gastropathy   . Chronic pain   . Depression   . Edema   . Headache(784.0)   . 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 Dialysis patient [facility]) 04/18/12    "[facility]; Tues, [place], [place]"  . Blood transfusion   . Arthritis     "I think I do; just in my fingers & my hands"  . Type I diabetes mellitus ([place])     "juvenile"  . Gastroparesis   . ESRD (end stage renal disease) on dialysis [facility])     Past Surgical History: Past Surgical History   Procedure Laterality Date  . Below knee leg amputation  "it's been awhile"    bilaterally  . Cataract extraction  ~ 2011    right  . Av fistula placement  08/2011    left upper arm  . Combined kidney-pancreas transplant      Social History: Social History  Substance Use Topics  . Smoking status: Never Smoker   . Smokeless tobacco: Never Used  . Alcohol Use: No   Additional social history: Patient reports smoking marijuana occasionally, sometimes to relieve nausea, last smoked 5 days ago. Denies EtOH or tobacco or other illicit substance use.   Please also refer to relevant sections of EMR.  Family History: Family History  Problem Relation Age of Onset  . Diabetes Other   . Hypertension Other     Allergies and Medications: No Known Allergies No current facility-administered medications on file prior to encounter.   Current Outpatient Prescriptions on File Prior to Encounter  Medication Sig Dispense Refill  . aspirin EC 81 MG tablet Take 81 mg by mouth daily.    . cyclobenzaprine (FLEXERIL) 10 MG tablet Take 1 tablet (10 mg total) by mouth 2 (two) times daily as needed for muscle spasms. (Patient not taking: Reported on 09/30/2015) 10 tablet 0  . metoCLOPramide (REGLAN) 10 MG tablet Take 1 tablet (10 mg total) by mouth every 6 (  six) hours. (Patient not taking: Reported on 12/16/2015) 15 tablet 0  . mycophenolate (MYFORTIC) 180 MG EC tablet Take 360 mg by mouth 2 (two) times daily.     [name] omeprazole (PRILOSEC) 20 MG capsule Take 1 capsule (20 mg total) by mouth daily as needed (heartburn). 30 capsule 0  . ondansetron (ZOFRAN ODT) 4 MG disintegrating tablet Take 1 tablet (4 mg total) by mouth every 8 (eight) hours as needed for nausea or vomiting. 20 tablet 0  . predniSONE (DELTASONE) 5 MG tablet Take 5 mg by mouth daily.     [name] sulfamethoxazole-trimethoprim (BACTRIM,SEPTRA) 400-80 MG per tablet Take 1 tablet by mouth every Monday, Wednesday, and Friday.     . tacrolimus (PROGRAF)  1 MG capsule Take 3-4 mg by mouth 2 (two) times daily. 4mg  in the morning, 3mg  in the evening      Objective: BP 152/92 mmHg  Pulse 80  Temp(Src) 98.2 F (36.8 C) (Oral)  Resp 24  Ht 6\' 1"  (1.854 m)  Wt 163 lb (73.936 kg)  BMI 21.51 kg/m2  SpO2 100% Exam: General: lying in bed with blanket over head upon entering room, conversational in NAD Eyes: PERRLA, EOMI ENTM: MMM Cardiovascular: RRR, no murmurs appreciated Respiratory: CTAB, no wheezes, normal work of breathing Abdomen: soft, non-distended, +BS, TTP in L quadrants MSK: bilateral BKA, moves all extremities Skin: scar on LUE presumably from prior fistula, no rashes noted Neuro: A&Ox3, no focal deficits Psych: appropriate mood and affect  Labs and Imaging: CBC BMET   Recent Labs Lab 12/16/15 2250  WBC 6.3  HGB 19.3*  HCT 58.8*  PLT 197    Recent Labs Lab 12/16/15 2250  NA 136  K 4.9  CL 102  CO2 22  BUN 13  CREATININE 1.65*  GLUCOSE 83  CALCIUM 8.9     Lipase 29   Ct Abdomen Pelvis Wo Contrast  12/16/2015 CT ABDOMEN AND PELVIS WITHOUT CONTRAST   IMPRESSION: Apparent diffuse thickening of the gastric wall possibly related to underdistention. Gastritis is less likely. Clinical correlation is recommended. No evidence of bowel obstruction. Normal appendix. Unremarkable right lower quadrant transplant kidney   Verner Mould, MD 12/17/2015, 1:04 AM PGY-1, [place] [facility] pager: [phone], text pages welcome    Upper Level Addendum:  I have seen and evaluated this patient along with Dr. Avon Gully and reviewed the above note, making necessary revisions in pink.  Virginia Crews, MD, MPH PGY-2,  [facility] 12/17/2015 1:07 AM

## 2015-12-25 ENCOUNTER — Emergency Department (HOSPITAL_COMMUNITY)
Admission: EM | Admit: 2015-12-25 | Discharge: 2015-12-26 | Disposition: A | Discharge status: Home / Self Care | Payer: Medicare Other | Attending: Emergency Medicine | Admitting: Emergency Medicine

## 2015-12-25 ENCOUNTER — Encounter (HOSPITAL_COMMUNITY): Payer: Self-pay | Admitting: Emergency Medicine

## 2015-12-25 DIAGNOSIS — K219 Gastro-esophageal reflux disease without esophagitis: Secondary | ICD-10-CM | POA: Insufficient documentation

## 2015-12-25 DIAGNOSIS — M1990 Unspecified osteoarthritis, unspecified site: Secondary | ICD-10-CM | POA: Diagnosis not present

## 2015-12-25 DIAGNOSIS — I120 Hypertensive chronic kidney disease with stage 5 chronic kidney disease or end stage renal disease: Secondary | ICD-10-CM | POA: Diagnosis not present

## 2015-12-25 DIAGNOSIS — E109 Type 1 diabetes mellitus without complications: Secondary | ICD-10-CM | POA: Insufficient documentation

## 2015-12-25 DIAGNOSIS — Z7952 Long term (current) use of systemic steroids: Secondary | ICD-10-CM | POA: Diagnosis not present

## 2015-12-25 DIAGNOSIS — G8929 Other chronic pain: Secondary | ICD-10-CM | POA: Insufficient documentation

## 2015-12-25 DIAGNOSIS — Z79899 Other long term (current) drug therapy: Secondary | ICD-10-CM | POA: Diagnosis not present

## 2015-12-25 DIAGNOSIS — K3184 Gastroparesis: Secondary | ICD-10-CM | POA: Diagnosis not present

## 2015-12-25 DIAGNOSIS — F329 Major depressive disorder, single episode, unspecified: Secondary | ICD-10-CM | POA: Diagnosis not present

## 2015-12-25 DIAGNOSIS — Z992 Dependence on renal dialysis: Secondary | ICD-10-CM | POA: Diagnosis not present

## 2015-12-25 DIAGNOSIS — R109 Unspecified abdominal pain: Secondary | ICD-10-CM | POA: Diagnosis present

## 2015-12-25 DIAGNOSIS — Z7982 Long term (current) use of aspirin: Secondary | ICD-10-CM | POA: Insufficient documentation

## 2015-12-25 DIAGNOSIS — N186 End stage renal disease: Secondary | ICD-10-CM | POA: Diagnosis not present

## 2015-12-25 LAB — CBC
HCT: 56.8 % — ABNORMAL HIGH (ref 39.0–52.0)
Hemoglobin: 18.7 g/dL — ABNORMAL HIGH (ref 13.0–17.0)
MCH: 31.5 pg (ref 26.0–34.0)
MCHC: 32.9 g/dL (ref 30.0–36.0)
MCV: 95.8 fL (ref 78.0–100.0)
PLATELETS: 238 10*3/uL (ref 150–400)
RBC: 5.93 MIL/uL — AB (ref 4.22–5.81)
RDW: 14.4 % (ref 11.5–15.5)
WBC: 5.7 10*3/uL (ref 4.0–10.5)

## 2015-12-25 LAB — COMPREHENSIVE METABOLIC PANEL
ALBUMIN: 3.5 g/dL (ref 3.5–5.0)
ALK PHOS: 108 U/L (ref 38–126)
ALT: 12 U/L — AB (ref 17–63)
AST: 14 U/L — AB (ref 15–41)
Anion gap: 11 (ref 5–15)
BUN: 12 mg/dL (ref 6–20)
CHLORIDE: 103 mmol/L (ref 101–111)
CO2: 24 mmol/L (ref 22–32)
CREATININE: 1.64 mg/dL — AB (ref 0.61–1.24)
Calcium: 9.3 mg/dL (ref 8.9–10.3)
GFR calc Af Amer: 59 mL/min — ABNORMAL LOW (ref >60)
GFR calc non Af Amer: 50 mL/min — ABNORMAL LOW (ref >60)
GLUCOSE: 107 mg/dL — AB (ref 65–99)
Potassium: 4.5 mmol/L (ref 3.5–5.1)
SODIUM: 138 mmol/L (ref 135–145)
Total Bilirubin: 0.7 mg/dL (ref 0.3–1.2)
Total Protein: 7.6 g/dL (ref 6.5–8.1)

## 2015-12-25 LAB — LIPASE, BLOOD: LIPASE: 27 U/L (ref 11–51)

## 2015-12-25 MED ORDER — ONDANSETRON 4 MG PO TBDP
ORAL_TABLET | ORAL | Status: AC
  Administered 2015-12-25: 4 mg
  Filled 2015-12-25: qty 1

## 2015-12-25 MED ORDER — OXYCODONE-ACETAMINOPHEN 5-325 MG PO TABS
ORAL_TABLET | ORAL | Status: AC
  Filled 2015-12-25: qty 1

## 2015-12-25 MED ORDER — OXYCODONE-ACETAMINOPHEN 5-325 MG PO TABS
1.0000 | ORAL_TABLET | Freq: Once | ORAL | Status: AC
  Administered 2015-12-25: 1 via ORAL

## 2015-12-25 MED ORDER — ONDANSETRON 4 MG PO TBDP: 4.0000 mg | ORAL_TABLET | Freq: Once | ORAL | Status: DC | PRN

## 2015-12-25 NOTE — ED Notes (Signed)
Pt from for eval of generalized abd pain that started this morning, pt states he was seen for same and told he had gastroporesis. Pt states has taken zofran at home with no relief. Pt uncomfortable in triage. Denies any fevers.

## 2015-12-25 NOTE — ED Provider Notes (Signed)
CSN: [id number]     Arrival date & time 12/25/15  1857 History  By signing my name below, I, Sonum Patel, attest that this documentation has been prepared under the direction and in the presence of Orpah Greek, MD. Electronically Signed: Sonum Patel, Education administrator. 12/25/2015. 11:54 PM.    Chief Complaint  Patient presents with  . Abdominal Pain  . Emesis    The history is provided by the patient. No language interpreter was used.     HPI Comments: [name] is a 39 y.o. male with past medical history of GERD and gastropathy who presents to the Emergency Department complaining of constant, unchanged left sided abdominal pain with associated nausea and vomiting that began this morning. Patient has been seen for similar complaints in the past; last ED visit was 12/16/15. He states he has been referred to a GI specialist but he has not been to see them.   Past Medical History  Diagnosis Date  . Hypertension   . GERD (gastroesophageal reflux disease)   . Gastropathy   . Chronic pain   . Depression   . Edema   . Headache(784.0)   . 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 Dialysis patient [facility]) 04/18/12    "[facility]; Tues, [place], [place]"  . Blood transfusion   . Arthritis     "I think I do; just in my fingers & my hands"  . Type I diabetes mellitus ([place])     "juvenile"  . Gastroparesis   . ESRD (end stage renal disease) on dialysis [facility])    Past Surgical History  Procedure Laterality Date  . Below knee leg amputation  "it's been awhile"    bilaterally  . Cataract extraction  ~ 2011    right  . Av fistula placement  08/2011    left upper arm  . Combined kidney-pancreas transplant     Family History  Problem Relation Age of Onset  . Diabetes Other   . Hypertension Other    Social History  Substance Use Topics  . Smoking status: Never Smoker   . Smokeless tobacco: Never Used  . Alcohol Use: No    Review of Systems  Gastrointestinal:  Positive for nausea, vomiting and abdominal pain.  All other systems reviewed and are negative.     Allergies  Review of patient's allergies indicates no known allergies.  Home Medications   Prior to Admission medications   Medication Sig Start Date End Date Taking? Authorizing Provider  aspirin EC 81 MG tablet Take 81 mg by mouth daily.   Yes Historical Provider, MD  metoCLOPramide (REGLAN) 10 MG tablet Take 1 tablet (10 mg total) by mouth every 6 (six) hours as needed for nausea. 12/17/15  Yes [place] N Rumley, DO  mycophenolate (MYFORTIC) 180 MG EC tablet Take 360 mg by mouth 2 (two) times daily.  07/06/13  Yes Historical Provider, MD  omeprazole (PRILOSEC) 20 MG capsule Take 1 capsule (20 mg total) by mouth daily as needed (heartburn). 06/06/14  Yes Linton Flemings, MD  ondansetron (ZOFRAN ODT) 4 MG disintegrating tablet Take 1 tablet (4 mg total) by mouth every 8 (eight) hours as needed for nausea or vomiting. 01/08/15  Yes Gareth Morgan, MD  polyethylene glycol (MIRALAX / GLYCOLAX) packet Take 17 g by mouth daily as needed for mild constipation. 12/17/15  Yes  N Rumley, DO  predniSONE (DELTASONE) 5 MG tablet Take 5 mg by mouth daily.  06/20/13  Yes Historical Provider,  MD  sulfamethoxazole-trimethoprim (BACTRIM,SEPTRA) 400-80 MG per tablet Take 1 tablet by mouth every Monday, Wednesday, and Friday.    Yes Historical Provider, MD  tacrolimus (PROGRAF) 1 MG capsule Take 3-4 mg by mouth 2 (two) times daily. 4mg  in the morning, 3mg  in the evening 07/16/13  Yes Historical Provider, MD  cyclobenzaprine (FLEXERIL) 10 MG tablet Take 1 tablet (10 mg total) by mouth 2 (two) times daily as needed for muscle spasms. Patient not taking: Reported on 09/30/2015 01/08/15   Gareth Morgan, MD   BP 136/92 mmHg  Pulse 71  Temp(Src) 98.4 F (36.9 C) (Oral)  Resp 18  Ht 6\' 1"  (1.854 m)  Wt 163 lb (73.936 kg)  BMI 21.51 kg/m2  SpO2 96% Physical Exam  Constitutional: He is oriented to person, place,  and time. He appears well-developed and well-nourished. No distress.  HENT:  Head: Normocephalic and atraumatic.  Right Ear: Hearing normal.  Left Ear: Hearing normal.  Nose: Nose normal.  Mouth/Throat: Oropharynx is clear and moist and mucous membranes are normal.  Eyes: Conjunctivae and EOM are normal. Pupils are equal, round, and reactive to light.  Neck: Normal range of motion. Neck supple.  Cardiovascular: Normal rate, regular rhythm, S1 normal and S2 normal.  Exam reveals no gallop and no friction rub.   No murmur heard. Pulmonary/Chest: Effort normal and breath sounds normal. No respiratory distress. He exhibits no tenderness.  Abdominal: Soft. Normal appearance and bowel sounds are normal. There is no hepatosplenomegaly. There is tenderness (generalized). There is no rebound, no guarding, no tenderness at McBurney's point and negative Murphy's sign. No hernia.  Musculoskeletal: Normal range of motion.  Neurological: He is alert and oriented to person, place, and time. He has normal strength. No cranial nerve deficit or sensory deficit. Coordination normal. GCS eye subscore is 4. GCS verbal subscore is 5. GCS motor subscore is 6.  Skin: Skin is warm, dry and intact. No rash noted. No cyanosis.  Psychiatric: He has a normal mood and affect. His speech is normal and behavior is normal. Thought content normal.  Nursing note and vitals reviewed.   ED Course  Procedures (including critical care time)  DIAGNOSTIC STUDIES: Oxygen Saturation is 96% on RA, adequate by my interpretation.    COORDINATION OF CARE: 11:58 PM Discussed treatment plan with pt at bedside and pt agreed to plan.   Labs Review Labs Reviewed  COMPREHENSIVE METABOLIC PANEL - Abnormal; Notable for the following:    Glucose, Bld 107 (*)    Creatinine, Ser 1.64 (*)    AST 14 (*)    ALT 12 (*)    GFR calc non Af Amer 50 (*)    GFR calc Af Amer 59 (*)    All other components within normal limits  CBC - Abnormal;  Notable for the following:    RBC 5.93 (*)    Hemoglobin 18.7 (*)    HCT 56.8 (*)    All other components within normal limits  LIPASE, BLOOD    Imaging Review No results found. I have personally reviewed and evaluated these images and lab results as part of my medical decision-making.   EKG Interpretation None      MDM   Final diagnoses:  None  Gastroparesis   Patient presents to the emergency department for evaluation of nausea and vomiting. Patient has had numerous visits for similar complaints in the past. Patient reports diffuse abdominal pain with nausea and vomiting. He reports that he has a history of gastroparesis  causing similar symptoms. As the third visit in the last week and a half. Patient has had thorough workup including CT scan recently. He will not require any additional imaging at this time. Patient treated symptomatically here in the ER, workup today has been unremarkable.  I personally performed the services described in this documentation, which was scribed in my presence. The recorded information has been reviewed and is accurate.    Orpah Greek, MD 12/26/15 [phone]

## 2015-12-26 DIAGNOSIS — K3184 Gastroparesis: Secondary | ICD-10-CM | POA: Diagnosis not present

## 2015-12-26 MED ORDER — DIPHENHYDRAMINE HCL 50 MG/ML IJ SOLN
25.0000 mg | Freq: Once | INTRAMUSCULAR | Status: AC
  Administered 2015-12-26: 25 mg via INTRAVENOUS
  Filled 2015-12-26: qty 1

## 2015-12-26 MED ORDER — SODIUM CHLORIDE 0.9 % IV BOLUS (SEPSIS)
1000.0000 mL | Freq: Once | INTRAVENOUS | Status: AC
  Administered 2015-12-26: 1000 mL via INTRAVENOUS

## 2015-12-26 MED ORDER — HYDROMORPHONE HCL 1 MG/ML IJ SOLN
1.0000 mg | Freq: Once | INTRAMUSCULAR | Status: AC
  Administered 2015-12-26: 1 mg via INTRAVENOUS
  Filled 2015-12-26: qty 1

## 2015-12-26 MED ORDER — HALOPERIDOL LACTATE 5 MG/ML IJ SOLN
5.0000 mg | Freq: Once | INTRAMUSCULAR | Status: AC
  Administered 2015-12-26: 5 mg via INTRAVENOUS
  Filled 2015-12-26: qty 1

## 2015-12-26 MED ORDER — ONDANSETRON HCL 4 MG/2ML IJ SOLN
4.0000 mg | Freq: Once | INTRAMUSCULAR | Status: AC
  Administered 2015-12-26: 4 mg via INTRAVENOUS
  Filled 2015-12-26: qty 2

## 2015-12-26 NOTE — ED Notes (Signed)
Attempted IV x1, unsuccessful.  

## 2015-12-26 NOTE — Discharge Instructions (Signed)
Gastroparesis °Gastroparesis, also called delayed gastric emptying, is a condition in which food takes longer than normal to empty from the stomach. The condition is usually long-lasting (chronic). °CAUSES °This condition may be caused by: °· An endocrine disorder, such as hypothyroidism or diabetes. Diabetes is the most common cause of this condition. °· A nervous system disease, such as Parkinson disease or multiple sclerosis. °· Cancer, infection, or surgery of the stomach or vagus nerve. °· A connective tissue disorder, such as scleroderma. °· Certain medicines. °In most cases, the cause is not known. °RISK FACTORS °This condition is more likely to develop in: °· People with certain disorders, including endocrine disorders, eating disorders, amyloidosis, and scleroderma. °· People with certain diseases, including Parkinson disease or multiple sclerosis. °· People with cancer or infection of the stomach or vagus nerve. °· People who have had surgery on the stomach or vagus nerve. °· People who take certain medicines. °· Women. °SYMPTOMS °Symptoms of this condition include: °· An early feeling of fullness when eating. °· Nausea. °· Weight loss. °· Vomiting. °· Heartburn. °· Abdominal bloating. °· Inconsistent blood glucose levels. °· Lack of appetite. °· Acid from the stomach coming up into the esophagus (gastroesophageal reflux). °· Spasms of the stomach. °Symptoms may come and go. °DIAGNOSIS °This condition is diagnosed with tests, such as: °· Tests that check how long it takes food to move through the stomach and intestines. These tests include: °¨ Upper gastrointestinal (GI) series. In this test, X-rays of the intestines are taken after you drink a liquid. The liquid makes the intestines show up better on the X-rays. °¨ Gastric emptying scintigraphy. In this test, scans are taken after you eat food that contains a small amount of radioactive material. °¨ Wireless capsule GI monitoring system. This test  involves swallowing a capsule that records information about movement through the stomach. °· Gastric manometry. This test measures electrical and muscular activity in the stomach. It is done with a thin tube that is passed down the throat and into the stomach. °· Endoscopy. This test checks for abnormalities in the lining of the stomach. It is done with a long, thin tube that is passed down the throat and into the stomach. °· An ultrasound. This test can help rule out gallbladder disease or pancreatitis as a cause of your symptoms. It uses sound waves to take pictures of the inside of your body. °TREATMENT °There is no cure for gastroparesis. This condition may be managed with: °· Treatment of the underlying condition causing the gastroparesis. °· Lifestyle changes, including exercise and dietary changes. Dietary changes can include: °¨ Changes in what and when you eat. °¨ Eating smaller meals more often. °¨ Eating low-fat foods. °¨ Eating low-fiber forms of high-fiber foods, such as cooked vegetables instead of raw vegetables. °¨ Having liquid foods in place of solid foods. Liquid foods are easier to digest. °· Medicines. These may be given to control nausea and vomiting and to stimulate stomach muscles. °· Getting food through a feeding tube. This may be done in severe cases. °· A gastric neurostimulator. This is a device that is inserted into the body with surgery. It helps improve stomach emptying and control nausea and vomiting. °HOME CARE INSTRUCTIONS °· Follow your health care provider's instructions about exercise and diet. °· Take medicines only as directed by your health care provider. °SEEK MEDICAL CARE IF: °· Your symptoms do not improve with treatment. °· You have new symptoms. °SEEK IMMEDIATE MEDICAL CARE IF: °· You have   severe abdominal pain that does not improve with treatment. °· You have nausea that does not go away. °· You cannot keep fluids down. °  °This information is not intended to replace  advice given to you by your health care provider. Make sure you discuss any questions you have with your health care provider. °  °Document Released: 12/12/2005 Document Revised: 04/28/2015 Document Reviewed: 12/08/2014 °Elsevier Interactive Patient Education ©2016 [employer] ° °

## 2016-01-07 ENCOUNTER — Other Ambulatory Visit (HOSPITAL_COMMUNITY): Payer: Self-pay

## 2016-01-08 ENCOUNTER — Inpatient Hospital Stay (HOSPITAL_COMMUNITY): Admission: RE | Admit: 2016-01-08 | Payer: Medicare Other | Source: Ambulatory Visit

## 2016-01-09 ENCOUNTER — Encounter (HOSPITAL_COMMUNITY): Payer: Self-pay | Admitting: *Deleted

## 2016-01-09 ENCOUNTER — Emergency Department (HOSPITAL_COMMUNITY): Payer: Medicare Other

## 2016-01-09 ENCOUNTER — Emergency Department (HOSPITAL_COMMUNITY)
Admission: EM | Admit: 2016-01-09 | Discharge: 2016-01-10 | Disposition: A | Discharge status: Home / Self Care | Payer: Medicare Other | Attending: Emergency Medicine | Admitting: Emergency Medicine

## 2016-01-09 DIAGNOSIS — Z89519 Acquired absence of unspecified leg below knee: Secondary | ICD-10-CM | POA: Diagnosis not present

## 2016-01-09 DIAGNOSIS — N186 End stage renal disease: Secondary | ICD-10-CM | POA: Insufficient documentation

## 2016-01-09 DIAGNOSIS — Z7982 Long term (current) use of aspirin: Secondary | ICD-10-CM | POA: Insufficient documentation

## 2016-01-09 DIAGNOSIS — G8929 Other chronic pain: Secondary | ICD-10-CM | POA: Diagnosis not present

## 2016-01-09 DIAGNOSIS — Z79899 Other long term (current) drug therapy: Secondary | ICD-10-CM | POA: Diagnosis not present

## 2016-01-09 DIAGNOSIS — K3184 Gastroparesis: Secondary | ICD-10-CM | POA: Diagnosis not present

## 2016-01-09 DIAGNOSIS — M1990 Unspecified osteoarthritis, unspecified site: Secondary | ICD-10-CM | POA: Insufficient documentation

## 2016-01-09 DIAGNOSIS — Z7952 Long term (current) use of systemic steroids: Secondary | ICD-10-CM | POA: Insufficient documentation

## 2016-01-09 DIAGNOSIS — Z8659 Personal history of other mental and behavioral disorders: Secondary | ICD-10-CM | POA: Insufficient documentation

## 2016-01-09 DIAGNOSIS — Z992 Dependence on renal dialysis: Secondary | ICD-10-CM | POA: Diagnosis not present

## 2016-01-09 DIAGNOSIS — K219 Gastro-esophageal reflux disease without esophagitis: Secondary | ICD-10-CM | POA: Diagnosis not present

## 2016-01-09 DIAGNOSIS — R112 Nausea with vomiting, unspecified: Secondary | ICD-10-CM | POA: Diagnosis not present

## 2016-01-09 DIAGNOSIS — I120 Hypertensive chronic kidney disease with stage 5 chronic kidney disease or end stage renal disease: Secondary | ICD-10-CM | POA: Diagnosis not present

## 2016-01-09 DIAGNOSIS — R1084 Generalized abdominal pain: Secondary | ICD-10-CM

## 2016-01-09 DIAGNOSIS — E109 Type 1 diabetes mellitus without complications: Secondary | ICD-10-CM | POA: Insufficient documentation

## 2016-01-09 LAB — COMPREHENSIVE METABOLIC PANEL
ALBUMIN: 3.3 g/dL — AB (ref 3.5–5.0)
ALT: 17 U/L (ref 17–63)
AST: 16 U/L (ref 15–41)
Alkaline Phosphatase: 104 U/L (ref 38–126)
Anion gap: 12 (ref 5–15)
BUN: 15 mg/dL (ref 6–20)
CHLORIDE: 100 mmol/L — AB (ref 101–111)
CO2: 20 mmol/L — ABNORMAL LOW (ref 22–32)
Calcium: 9.3 mg/dL (ref 8.9–10.3)
Creatinine, Ser: 1.54 mg/dL — ABNORMAL HIGH (ref 0.61–1.24)
GFR calc Af Amer: >60 mL/min (ref >60)
GFR, EST NON AFRICAN AMERICAN: 54 mL/min — AB (ref >60)
GLUCOSE: 116 mg/dL — AB (ref 65–99)
POTASSIUM: 5.1 mmol/L (ref 3.5–5.1)
Sodium: 132 mmol/L — ABNORMAL LOW (ref 135–145)
Total Bilirubin: 1.3 mg/dL — ABNORMAL HIGH (ref 0.3–1.2)
Total Protein: 7.3 g/dL (ref 6.5–8.1)

## 2016-01-09 LAB — CBC
HEMATOCRIT: 60.8 % — AB (ref 39.0–52.0)
Hemoglobin: 20.5 g/dL — ABNORMAL HIGH (ref 13.0–17.0)
MCH: 31.8 pg (ref 26.0–34.0)
MCHC: 33.7 g/dL (ref 30.0–36.0)
MCV: 94.3 fL (ref 78.0–100.0)
Platelets: 190 10*3/uL (ref 150–400)
RBC: 6.45 MIL/uL — ABNORMAL HIGH (ref 4.22–5.81)
RDW: 14.3 % (ref 11.5–15.5)
WBC: 9.1 10*3/uL (ref 4.0–10.5)

## 2016-01-09 LAB — LIPASE, BLOOD: LIPASE: 23 U/L (ref 11–51)

## 2016-01-09 MED ORDER — HYDROMORPHONE HCL 1 MG/ML IJ SOLN
1.0000 mg | Freq: Once | INTRAMUSCULAR | Status: AC
  Administered 2016-01-09: 1 mg via INTRAVENOUS
  Filled 2016-01-09: qty 1

## 2016-01-09 MED ORDER — BARIUM SULFATE 2.1 % PO SUSP
900.0000 mL | Freq: Once | ORAL | Status: AC
  Administered 2016-01-09: 900 mL via ORAL

## 2016-01-09 MED ORDER — SODIUM CHLORIDE 0.9 % IV BOLUS (SEPSIS)
1000.0000 mL | Freq: Once | INTRAVENOUS | Status: AC
  Administered 2016-01-09: 1000 mL via INTRAVENOUS

## 2016-01-09 MED ORDER — BARIUM SULFATE 2.1 % PO SUSP
ORAL | Status: AC
  Filled 2016-01-09: qty 2

## 2016-01-09 MED ORDER — ONDANSETRON HCL 4 MG/2ML IJ SOLN
4.0000 mg | Freq: Once | INTRAMUSCULAR | Status: AC
  Administered 2016-01-09: 4 mg via INTRAVENOUS
  Filled 2016-01-09: qty 2

## 2016-01-09 MED ORDER — HYDROMORPHONE HCL 1 MG/ML IJ SOLN
1.0000 mg | Freq: Once | INTRAMUSCULAR | Status: AC
  Administered 2016-01-10: 1 mg via INTRAVENOUS
  Filled 2016-01-09: qty 1

## 2016-01-09 MED ORDER — ONDANSETRON 4 MG PO TBDP
4.0000 mg | ORAL_TABLET | Freq: Once | ORAL | Status: AC | PRN
  Administered 2016-01-09: 4 mg via ORAL

## 2016-01-09 MED ORDER — ONDANSETRON 4 MG PO TBDP
ORAL_TABLET | ORAL | Status: AC
  Filled 2016-01-09: qty 1

## 2016-01-09 NOTE — ED Notes (Signed)
Pt states he is no able to make urine for the past 3 days and refuses to have a in and out cath done, refusing IV on his [facility] area, one attempt for IV made on his hand and vein collapse, pt requesting IV team states we can't force him to have anything done that he doesn't want to be done. IV team paged.

## 2016-01-09 NOTE — ED Notes (Addendum)
Pt reports bil kidney & pancreas transplant in 2014 at [facility], pt reports acute bil upper abd pain onset x 3 days ago, pt c/o nausea, denies v/d, pt reports oliguria, denies hematuria, & dysuria, onset x 3 days, pt reports seeing his urologist Jan 01, 2016 Dr. Jeffie Pollock & was symptom free at that time, A&O x4, follows commands, speaks in complete sentences, pt denies pain relief with phenergan & Percocet 7.5-325 mg

## 2016-01-10 DIAGNOSIS — R1084 Generalized abdominal pain: Secondary | ICD-10-CM | POA: Diagnosis not present

## 2016-01-10 LAB — URINALYSIS, ROUTINE W REFLEX MICROSCOPIC
Glucose, UA: NEGATIVE mg/dL
HGB URINE DIPSTICK: NEGATIVE
KETONES UR: 15 mg/dL — AB
Leukocytes, UA: NEGATIVE
Nitrite: NEGATIVE
PROTEIN: NEGATIVE mg/dL
Specific Gravity, Urine: 1.018 (ref 1.005–1.030)
pH: 5.5 (ref 5.0–8.0)

## 2016-01-10 LAB — GASTROINTESTINAL PANEL BY PCR, STOOL (REPLACES STOOL CULTURE)
ADENOVIRUS F40/41: NOT DETECTED
ASTROVIRUS: NOT DETECTED
CAMPYLOBACTER SPECIES: NOT DETECTED
Cryptosporidium: NOT DETECTED
Cyclospora cayetanensis: NOT DETECTED
E. coli O157: NOT DETECTED
ENTAMOEBA HISTOLYTICA: NOT DETECTED
ENTEROAGGREGATIVE E COLI (EAEC): NOT DETECTED
ENTEROPATHOGENIC E COLI (EPEC): NOT DETECTED
ENTEROTOXIGENIC E COLI (ETEC): NOT DETECTED
GIARDIA LAMBLIA: NOT DETECTED
NOROVIRUS GI/GII: NOT DETECTED
PLESIMONAS SHIGELLOIDES: NOT DETECTED
Rotavirus A: NOT DETECTED
SALMONELLA SPECIES: NOT DETECTED
SHIGELLA/ENTEROINVASIVE E COLI (EIEC): NOT DETECTED
Sapovirus (I, II, IV, and V): NOT DETECTED
Shiga like toxin producing E coli (STEC): NOT DETECTED
VIBRIO CHOLERAE: NOT DETECTED
Vibrio species: NOT DETECTED
Yersinia enterocolitica: NOT DETECTED

## 2016-01-10 LAB — C DIFFICILE QUICK SCREEN W PCR REFLEX
C DIFFICILE (CDIFF) TOXIN: NEGATIVE
C DIFFICLE (CDIFF) ANTIGEN: NEGATIVE
C Diff interpretation: NEGATIVE

## 2016-01-10 MED ORDER — METRONIDAZOLE 500 MG PO TABS: 500.0000 mg | ORAL_TABLET | Freq: Two times a day (BID) | ORAL | Status: AC

## 2016-01-10 MED ORDER — CIPROFLOXACIN HCL 250 MG PO TABS: 250.0000 mg | ORAL_TABLET | Freq: Two times a day (BID) | ORAL | Status: AC

## 2016-01-10 NOTE — ED Provider Notes (Signed)
U/a is negative cdif is still pending Stable for d/c home   Ripley Fraise, MD 01/10/16 [phone]

## 2016-01-10 NOTE — ED Provider Notes (Signed)
CSN: [id number]     Arrival date & time 01/09/16  1853 History   First MD Initiated Contact with Patient 01/09/16 2041     Chief Complaint  Patient presents with  . Abdominal Pain     (Consider location/radiation/quality/duration/timing/severity/associated sxs/prior Treatment) HPI  40 y M w PMH htn, depression, chronic pain, gastroparesis, previous kidney and pancreas transplant who is coming in with complaint of abdominal pain.  The abdominal pain started two days ago, is generalized, he has difficulty describing the pain, the pain has been associated with multiple episodes of NBNB emesis and a couple of loose, non-bloody stools.  No other sx.  No fever, no dysuria.  Past Medical History  Diagnosis Date  . Hypertension   . GERD (gastroesophageal reflux disease)   . Gastropathy   . Chronic pain   . Depression   . Edema   . Headache(784.0)   . 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 Dialysis patient [facility]) 04/18/12    "[facility]; Tues, [place], [place]"  . Blood transfusion   . Arthritis     "I think I do; just in my fingers & my hands"  . 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 Gastroparesis   . ESRD (end stage renal disease) on dialysis [facility])    Past Surgical History  Procedure Laterality Date  . Below knee leg amputation  "it's been awhile"    bilaterally  . Cataract extraction  ~ 2011    right  . Av fistula placement  08/2011    left upper arm  . Combined kidney-pancreas transplant     Family History  Problem Relation Age of Onset  . Diabetes Other   . Hypertension Other    Social History  Substance Use Topics  . Smoking status: Never Smoker   . Smokeless tobacco: Never Used  . Alcohol Use: No    Review of Systems  Constitutional: Negative for fever and chills.  Eyes: Negative for redness.  Respiratory: Negative for cough and shortness of breath.   Cardiovascular: Negative for chest pain.  Gastrointestinal: Positive for nausea,  vomiting and abdominal pain. Negative for diarrhea.  Genitourinary: Negative for dysuria.  Skin: Negative for rash.  Neurological: Negative for headaches.  All other systems reviewed and are negative.     Allergies  Review of patient's allergies indicates no known allergies.  Home Medications   Prior to Admission medications   Medication Sig Start Date End Date Taking? Authorizing Provider  aspirin EC 81 MG tablet Take 81 mg by mouth daily.   Yes Historical Provider, MD  metoCLOPramide (REGLAN) 10 MG tablet Take 1 tablet (10 mg total) by mouth every 6 (six) hours as needed for nausea. 12/17/15  Yes Unadilla N Rumley, DO  mycophenolate (MYFORTIC) 180 MG EC tablet Take 360 mg by mouth 2 (two) times daily.  07/06/13  Yes Historical Provider, MD  omeprazole (PRILOSEC) 20 MG capsule Take 1 capsule (20 mg total) by mouth daily as needed (heartburn). 06/06/14  Yes Linton Flemings, MD  ondansetron (ZOFRAN ODT) 4 MG disintegrating tablet Take 1 tablet (4 mg total) by mouth every 8 (eight) hours as needed for nausea or vomiting. 01/08/15  Yes Gareth Morgan, MD  pantoprazole (PROTONIX) 20 MG tablet Take 20 mg by mouth daily.   Yes Historical Provider, MD  polyethylene glycol (MIRALAX / GLYCOLAX) packet Take 17 g by mouth daily as needed for mild constipation. 12/17/15  Yes Lorna Few,  DO  predniSONE (DELTASONE) 5 MG tablet Take 5 mg by mouth daily.  06/20/13  Yes Historical Provider, MD  promethazine (PHENERGAN) 25 MG tablet Take 25 mg by mouth every 6 (six) hours as needed for nausea or vomiting.   Yes Historical Provider, MD  sulfamethoxazole-trimethoprim (BACTRIM,SEPTRA) 400-80 MG per tablet Take 1 tablet by mouth every Monday, Wednesday, and Friday.    Yes Historical Provider, MD  tacrolimus (PROGRAF) 1 MG capsule Take 3-4 mg by mouth 2 (two) times daily. 4mg  in the morning, 3mg  in the evening 07/16/13  Yes Historical Provider, MD  cyclobenzaprine (FLEXERIL) 10 MG tablet Take 1 tablet (10 mg total)  by mouth 2 (two) times daily as needed for muscle spasms. Patient not taking: Reported on 09/30/2015 01/08/15   Gareth Morgan, MD   BP 165/90 mmHg  Pulse 85  Temp(Src) 98.3 F (36.8 C) (Oral)  Resp 14  SpO2 100% Physical Exam  Constitutional: He is oriented to person, place, and time. No distress.  HENT:  Head: Normocephalic and atraumatic.  Eyes: EOM are normal. Pupils are equal, round, and reactive to light.  Neck: Normal range of motion. Neck supple.  Cardiovascular: Normal rate.   Pulmonary/Chest: Effort normal. No respiratory distress.  Abdominal: Soft. There is tenderness (diffuse). There is no rebound and no guarding.  Musculoskeletal: Normal range of motion.  Neurological: He is alert and oriented to person, place, and time.  Skin: No rash noted. He is not diaphoretic.  Psychiatric: He has a normal mood and affect.    ED Course  Procedures (including critical care time) Labs Review Labs Reviewed  COMPREHENSIVE METABOLIC PANEL - Abnormal; Notable for the following:    Sodium 132 (*)    Chloride 100 (*)    CO2 20 (*)    Glucose, Bld 116 (*)    Creatinine, Ser 1.54 (*)    Albumin 3.3 (*)    Total Bilirubin 1.3 (*)    GFR calc non Af Amer 54 (*)    All other components within normal limits  CBC - Abnormal; Notable for the following:    RBC 6.45 (*)    Hemoglobin 20.5 (*)    HCT 60.8 (*)    All other components within normal limits  LIPASE, BLOOD  URINALYSIS, ROUTINE W REFLEX MICROSCOPIC (NOT AT [facility])  TACROLIMUS LEVEL    Imaging Review Ct Abdomen Pelvis Wo Contrast  01/10/2016  CLINICAL DATA:  40 year old male with history of kidney and pancreas transplant presenting with acute upper abdominal pain. EXAM: CT ABDOMEN AND PELVIS WITHOUT CONTRAST TECHNIQUE: Multidetector CT imaging of the abdomen and pelvis was performed following the standard protocol without IV contrast. COMPARISON:  CT dated 12/16/2015 and 01/08/2015 FINDINGS: Evaluation of this exam is limited  in the absence of intravenous contrast. The visualized lung bases are clear. There is coronary vascular calcification. No intra-abdominal free air or free fluid. The liver and gallbladder appear unremarkable. Postsurgical changes of pancreatic transplant in the upper abdomen. The transplanted pancreas appears unremarkable. The spleen and adrenal glands appear unremarkable. There is moderate bilateral a renal atrophy. No hydronephrosis or nephrolithiasis. The visualized ureters and urinary bladder appear unremarkable. The right lower quadrant transplant kidney is noted and appears unremarkable. There is no hydronephrosis or nephrolithiasis of the transplant kidney. No peritransplant fluid collection identified. The prostate and seminal vesicles are grossly unremarkable. There is apparent diffuse thickening of the stomach small likely related to underdistention. Gastritis is less likely. Oral contrast opacifies multiple loops of small bowel  and traverses into the colon without evidence of obstruction. Postsurgical changes of bowel in the upper abdomen with anastomotic sutures. There is mild thickened appearance of the wall of the small bowel as well as diffuse mild thickened appearance of the colonic fold predominantly involving the proximal colon and cecum concerning for enterocolitis. Correlation with clinical exam recommended. The appendix is not visualized with certainty. No inflammatory changes identified in the right lower quadrant. There are atherosclerotic calcification of the distal aorta and iliac arteries. There is atherosclerotic calcification of the mesentery vasculature as well as the renal arteries. There is no adenopathy. Midline vertical anterior abdominal wall incisional scar. The soft tissues are grossly unremarkable. The osseous structures are intact. IMPRESSION: Thickened appearance of the wall of the small bowel and colonic folds concerning for enterocolitis. Clinical correlation is recommended.  No evidence of bowel obstruction. Right lower quadrant renal transplant and upper abdominal pancreatic transplant appear unremarkable. Electronically Signed   By: Anner Crete M.D.   On: 01/10/2016 00:07   I have personally reviewed and evaluated these images and lab results as part of my medical decision-making.   EKG Interpretation None      MDM   Final diagnoses:  None    40 y M w PMH htn, depression, chronic pain, gastroparesis, previous kidney and pancreas transplant who is coming in with complaint of abdominal pain.  Exam as above.  ttp diffusely throughout abdomen.  Will obtain ct abdomen/pelvis, cbc/cmp/lipase/ua  Cbc/cmp/lipase all unremarkable.  Creatinine is at baseline and patient isn't focally ttp over his kidney transplant so I doubt rejection.  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  Awaiting CT.  Have given dilaudid for analgesia.  Abdominal pain improved with dilaudid.  Ct w/ possible colitis.  Will plan to treat with cipro/flagyl.  Will send GIP panel and c. Diff.  Awaiting ua w/ plan for likely d/c and close outpt f/u after.  Dr. Christy Gentles will follow up on the urine for me.  Patient care transferred at 1:30 am.     Jarome Matin, MD 01/10/16 [place], MD 01/12/16 1452

## 2016-01-10 NOTE — Discharge Instructions (Signed)
Abdominal Pain, Adult °Many things can cause abdominal pain. Usually, abdominal pain is not caused by a disease and will improve without treatment. It can often be observed and treated at home. Your health care provider will do a physical exam and possibly order blood tests and X-rays to help determine the seriousness of your pain. However, in many cases, more time must pass before a clear cause of the pain can be found. Before that point, your health care provider may not know if you need more testing or further treatment. °HOME CARE INSTRUCTIONS °Monitor your abdominal pain for any changes. The following actions may help to alleviate any discomfort you are experiencing: °· Only take over-the-counter or prescription medicines as directed by your health care provider. °· Do not take laxatives unless directed to do so by your health care provider. °· Try a clear liquid diet (broth, tea, or water) as directed by your health care provider. Slowly move to a bland diet as tolerated. °SEEK MEDICAL CARE IF: °· You have unexplained abdominal pain. °· You have abdominal pain associated with nausea or diarrhea. °· You have pain when you urinate or have a bowel movement. °· You experience abdominal pain that wakes you in the night. °· You have abdominal pain that is worsened or improved by eating food. °· You have abdominal pain that is worsened with eating fatty foods. °· You have a fever. °SEEK IMMEDIATE MEDICAL CARE IF: °· Your pain does not go away within 2 hours. °· You keep throwing up (vomiting). °· Your pain is felt only in portions of the abdomen, such as the right side or the left lower portion of the abdomen. °· You pass bloody or black tarry stools. °MAKE SURE YOU: °· Understand these instructions. °· Will watch your condition. °· Will get help right away if you are not doing well or get worse. °  °This information is not intended to replace advice given to you by your health care provider. Make sure you discuss  any questions you have with your health care provider. °  °Document Released: 09/21/2005 Document Revised: 09/02/2015 Document Reviewed: 08/21/2013 °Elsevier Interactive Patient Education ©2016 Elsevier Inc[employer] ° °Colitis °Colitis is inflammation of the colon. Colitis may last a short time (acute) or it may last a long time (chronic). °CAUSES °This condition may be caused by: °· Viruses. °· Bacteria. °· Reactions to medicine. °· Certain autoimmune diseases, such as Crohn disease or ulcerative colitis. °SYMPTOMS °Symptoms of this condition include: °· Diarrhea. °· Passing bloody or tarry stool. °· Pain. °· Fever. °· Vomiting. °· Tiredness (fatigue). °· Weight loss. °· Bloating. °· Sudden increase in abdominal pain. °· Having fewer bowel movements than usual. °DIAGNOSIS °This condition is diagnosed with a stool test or a blood test. You may also have other tests, including X-rays, a CT scan, or a colonoscopy. °TREATMENT °Treatment may include: °· Resting the bowel. This involves not eating or drinking for a period of time. °· Fluids that are given through an IV tube. °· Medicine for pain and diarrhea. °· Antibiotic medicines. °· Cortisone medicines. °· Surgery. °HOME CARE INSTRUCTIONS °Eating and Drinking °· Follow instructions from your health care provider about eating or drinking restrictions. °· Drink enough fluid to keep your urine clear or pale yellow. °· Work with a dietitian to determine which foods cause your condition to flare up. °· Avoid foods that cause flare-ups. °· Eat a well-balanced diet. °Medicines °· Take over-the-counter and prescription medicines only as told by your health   care provider. °· If you were prescribed an antibiotic medicine, take it as told by your health care provider. Do not stop taking the antibiotic even if you start to feel better. °General Instructions °· Keep all follow-up visits as told by your health care provider. This is important. °SEEK MEDICAL CARE IF: °· Your symptoms do  not go away. °· You develop new symptoms. °SEEK IMMEDIATE MEDICAL CARE IF: °· You have a fever that does not go away with treatment. °· You develop chills. °· You have extreme weakness, fainting, or dehydration. °· You have repeated vomiting. °· You develop severe pain in your abdomen. °· You pass bloody or tarry stool. °  °This information is not intended to replace advice given to you by your health care provider. Make sure you discuss any questions you have with your health care provider. °  °Document Released: 01/19/2005 Document Revised: 09/02/2015 Document Reviewed: 04/06/2015 °Elsevier Interactive Patient Education ©2016 Elsevier Inc. ° °

## 2016-01-12 LAB — TACROLIMUS LEVEL: Tacrolimus (FK506) - LabCorp: 7.4 ng/mL (ref 2.0–20.0)

## 2016-01-15 ENCOUNTER — Ambulatory Visit (HOSPITAL_COMMUNITY)
Admission: RE | Admit: 2016-01-15 | Discharge: 2016-01-15 | Disposition: A | Discharge status: Home / Self Care | Payer: Medicare Other | Source: Ambulatory Visit | Attending: Nephrology | Admitting: Nephrology

## 2016-01-15 DIAGNOSIS — D751 Secondary polycythemia: Secondary | ICD-10-CM | POA: Insufficient documentation

## 2016-01-15 LAB — HEMOGLOBIN: Hemoglobin: 18 g/dL — ABNORMAL HIGH (ref 13.0–17.0)

## 2016-01-15 NOTE — Progress Notes (Signed)
Therapeutic phlebotomy performed via right A/C without difficulty.  250cc blood obtained.  Pt with no complaints.  Po's offered post procedure.  Will monitor prior to discharge.

## 2016-01-17 LAB — TACROLIMUS LEVEL: Tacrolimus (FK506) - LabCorp: 6.3 ng/mL (ref 2.0–20.0)

## 2016-02-02 ENCOUNTER — Telehealth: Payer: Self-pay | Admitting: Internal Medicine

## 2016-02-02 NOTE — Telephone Encounter (Signed)
I left a message with appt details for the patient .  He is scheduled for 02/09/16 with Cecille Rubin Hvozdovic, PA at 1:15.  I mailed him new paperwork

## 2016-02-08 ENCOUNTER — Ambulatory Visit: Payer: Medicare Other | Admitting: Gastroenterology

## 2016-02-09 ENCOUNTER — Ambulatory Visit: Payer: Medicare Other | Admitting: Physician Assistant

## 2016-02-11 ENCOUNTER — Other Ambulatory Visit (HOSPITAL_COMMUNITY): Payer: Self-pay | Admitting: *Deleted

## 2016-02-12 ENCOUNTER — Encounter (HOSPITAL_COMMUNITY)
Admission: RE | Admit: 2016-02-12 | Discharge: 2016-02-12 | Disposition: A | Discharge status: Home / Self Care | Payer: Medicare Other | Source: Ambulatory Visit | Attending: Nephrology | Admitting: Nephrology

## 2016-02-12 DIAGNOSIS — D751 Secondary polycythemia: Secondary | ICD-10-CM | POA: Diagnosis present

## 2016-02-12 NOTE — Progress Notes (Signed)
hemocue 16.9 today phlebotomized 250cc and pt tolerated procedure well.

## 2016-02-15 LAB — POCT HEMOGLOBIN-HEMACUE: HEMOGLOBIN: 16.9 g/dL (ref 13.0–17.0)

## 2016-03-18 ENCOUNTER — Encounter (HOSPITAL_COMMUNITY): Payer: Medicare Other

## 2016-03-22 ENCOUNTER — Other Ambulatory Visit (HOSPITAL_COMMUNITY): Payer: Self-pay | Admitting: *Deleted

## 2016-03-23 ENCOUNTER — Encounter (HOSPITAL_COMMUNITY)
Admission: RE | Admit: 2016-03-23 | Discharge: 2016-03-23 | Disposition: A | Discharge status: Home / Self Care | Payer: Medicare Other | Source: Ambulatory Visit | Attending: Nephrology | Admitting: Nephrology

## 2016-03-23 DIAGNOSIS — D751 Secondary polycythemia: Secondary | ICD-10-CM | POA: Insufficient documentation

## 2016-03-23 LAB — POCT HEMOGLOBIN-HEMACUE: HEMOGLOBIN: 18.4 g/dL — AB (ref 13.0–17.0)

## 2016-03-23 NOTE — Progress Notes (Signed)
Pt HGB 18.4 today; one unit phlebotomy completed using right antecub; pt, tolerated well

## 2016-04-22 ENCOUNTER — Ambulatory Visit (INDEPENDENT_AMBULATORY_CARE_PROVIDER_SITE_OTHER): Payer: Medicare Other | Admitting: Family Medicine

## 2016-04-22 ENCOUNTER — Encounter: Payer: Self-pay | Admitting: Family Medicine

## 2016-04-22 VITALS — BP 166/101 | HR 86 | Temp 98.3°F | Ht 73.0 in | Wt 163.0 lb

## 2016-04-22 DIAGNOSIS — Z89512 Acquired absence of left leg below knee: Secondary | ICD-10-CM | POA: Diagnosis not present

## 2016-04-22 DIAGNOSIS — R030 Elevated blood-pressure reading, without diagnosis of hypertension: Secondary | ICD-10-CM | POA: Diagnosis not present

## 2016-04-22 DIAGNOSIS — IMO0001 Reserved for inherently not codable concepts without codable children: Secondary | ICD-10-CM

## 2016-04-22 DIAGNOSIS — Z89511 Acquired absence of right leg below knee: Secondary | ICD-10-CM | POA: Diagnosis not present

## 2016-04-22 DIAGNOSIS — E1043 Type 1 diabetes mellitus with diabetic autonomic (poly)neuropathy: Secondary | ICD-10-CM

## 2016-04-22 DIAGNOSIS — K3184 Gastroparesis: Secondary | ICD-10-CM | POA: Diagnosis not present

## 2016-04-22 LAB — POCT GLYCOSYLATED HEMOGLOBIN (HGB A1C): HEMOGLOBIN A1C: 5.5

## 2016-04-22 NOTE — Patient Instructions (Signed)
Thank you for coming to see me today. It was a pleasure. Today we talked about:   Prostheses: I am giving you a prescription for supplies  Blood pressure: please follow-up with your kidney doctor regarding your blood pressure. It is important to make sure it is well controlled.  Diabetes: I am checking your A1C today.  Please make an appointment to see your new doctor in 3-6 months.  If you have any questions or concerns, please do not hesitate to call the office at [phone].  Sincerely,  Cordelia Poche, MD

## 2016-04-22 NOTE — Progress Notes (Signed)
    Subjective    [name] is a 40 y.o. male that presents for a follow-up visit for:   1. Prosthetic limbs: Patient has bilateral BKAs. He currently gets supplies from [employer]. He is requesting a prescription for supplies and liners.  2. Elevated blood pressure: He has a remote history of hypertension when he had diabetes but has not been on medications. He follows up with [facility] for his kidney transplant and they had no problems with his blood pressure.  3. Diabetes mellitus, type 1: S/p pancreas transplant. Currently not on insulin. No polyuria, polydipsia or polyphagia.  Social History  Substance Use Topics  . Smoking status: Never Smoker   . Smokeless tobacco: Never Used  . Alcohol Use: No    No Known Allergies  No orders of the defined types were placed in this encounter.    ROS  Per HPI   Objective   BP 166/101 mmHg  Pulse 86  Temp(Src) 98.3 F (36.8 C) (Oral)  Ht 6\' 1"  (1.854 m)  Wt 163 lb (73.936 kg)  BMI 21.51 kg/m2  Vital signs reviewed  General: Well appearing Musculoskeletal: Bilateral BKA  Assessment and Plan    1. S/P bilateral BKA (below knee amputation) ([place]) Prescription for supplies  2. Diabetic gastroparesis associated with type 1 diabetes mellitus (HCC) - HgB A1c  3. Elevated blood pressure Follow-up BP check at kidney physician. If still elevated, may need management

## 2016-05-24 ENCOUNTER — Other Ambulatory Visit (HOSPITAL_COMMUNITY): Payer: Self-pay | Admitting: *Deleted

## 2016-05-25 ENCOUNTER — Encounter (HOSPITAL_COMMUNITY)
Admission: RE | Admit: 2016-05-25 | Discharge: 2016-05-25 | Disposition: A | Discharge status: Home / Self Care | Payer: Medicare Other | Source: Ambulatory Visit | Attending: Nephrology | Admitting: Nephrology

## 2016-05-25 DIAGNOSIS — D751 Secondary polycythemia: Secondary | ICD-10-CM | POA: Diagnosis not present

## 2016-05-25 LAB — POCT HEMOGLOBIN-HEMACUE: Hemoglobin: 18.9 g/dL — ABNORMAL HIGH (ref 13.0–17.0)

## 2016-05-25 NOTE — Progress Notes (Signed)
Dr Moshe Cipro stated okay to use left arm since fistula no longer working.  Dr Moshe Cipro also stated if we can get off 500cc we can since pt's hgb so high today. Attempted phlebotomy on left arm but the tubing clotted off again and only able to obtain 25cc of blood.  Placed a 20 gauge PIV in left arm and removed 50cc before vein stopped giving blood.  Removed PIV and site WNL and cath intact.   Started 20 gauge PIV in right AC per patient request to try again, and was able to remove 225cc of blood for a total of 300cc removed today.  DC PIV from right AC, site WNl, Cath intact.PT tolerated procedure well

## 2016-05-25 NOTE — Progress Notes (Addendum)
Attempted phlebotomy today hemocue 18.9/  Unable to perform phlebotomy and do not see any other place on right arm to stick.  Pt has scar tissue in right  AC from multiple previous draws over the years.  Call placed to Dr Moshe Cipro.

## 2016-06-01 ENCOUNTER — Encounter (HOSPITAL_BASED_OUTPATIENT_CLINIC_OR_DEPARTMENT_OTHER): Payer: Medicare Other | Attending: Surgery

## 2016-06-01 DIAGNOSIS — T8789 Other complications of amputation stump: Secondary | ICD-10-CM | POA: Diagnosis not present

## 2016-06-01 DIAGNOSIS — Y838 Other surgical procedures as the cause of abnormal reaction of the patient, or of later complication, without mention of misadventure at the time of the procedure: Secondary | ICD-10-CM | POA: Diagnosis not present

## 2016-06-01 DIAGNOSIS — Z89511 Acquired absence of right leg below knee: Secondary | ICD-10-CM | POA: Insufficient documentation

## 2016-06-01 DIAGNOSIS — Z944 Liver transplant status: Secondary | ICD-10-CM | POA: Insufficient documentation

## 2016-06-01 DIAGNOSIS — Z7982 Long term (current) use of aspirin: Secondary | ICD-10-CM | POA: Diagnosis not present

## 2016-06-01 DIAGNOSIS — Z9483 Pancreas transplant status: Secondary | ICD-10-CM | POA: Insufficient documentation

## 2016-06-01 DIAGNOSIS — Z7952 Long term (current) use of systemic steroids: Secondary | ICD-10-CM | POA: Insufficient documentation

## 2016-06-01 DIAGNOSIS — Z89512 Acquired absence of left leg below knee: Secondary | ICD-10-CM | POA: Diagnosis not present

## 2016-06-01 DIAGNOSIS — Z940 Kidney transplant status: Secondary | ICD-10-CM | POA: Diagnosis not present

## 2016-06-01 DIAGNOSIS — I10 Essential (primary) hypertension: Secondary | ICD-10-CM | POA: Diagnosis not present

## 2016-06-01 DIAGNOSIS — K3184 Gastroparesis: Secondary | ICD-10-CM | POA: Diagnosis not present

## 2016-06-01 DIAGNOSIS — E1043 Type 1 diabetes mellitus with diabetic autonomic (poly)neuropathy: Secondary | ICD-10-CM | POA: Insufficient documentation

## 2016-06-01 DIAGNOSIS — Z79899 Other long term (current) drug therapy: Secondary | ICD-10-CM | POA: Diagnosis not present

## 2016-07-14 ENCOUNTER — Other Ambulatory Visit (HOSPITAL_COMMUNITY): Payer: Self-pay | Admitting: *Deleted

## 2016-07-15 ENCOUNTER — Encounter (HOSPITAL_COMMUNITY)
Admission: RE | Admit: 2016-07-15 | Discharge: 2016-07-15 | Disposition: A | Discharge status: Home / Self Care | Payer: Medicare Other | Source: Ambulatory Visit | Attending: Nephrology | Admitting: Nephrology

## 2016-07-15 DIAGNOSIS — D751 Secondary polycythemia: Secondary | ICD-10-CM | POA: Insufficient documentation

## 2016-07-15 NOTE — Progress Notes (Signed)
IV start for theraputic phlebotomy 250cc removed; pt. Tolerated well.

## 2016-07-26 ENCOUNTER — Encounter (HOSPITAL_COMMUNITY)
Admission: RE | Admit: 2016-07-26 | Discharge: 2016-07-26 | Disposition: A | Discharge status: Home / Self Care | Payer: Medicare Other | Source: Ambulatory Visit | Attending: Nephrology | Admitting: Nephrology

## 2016-07-26 DIAGNOSIS — D751 Secondary polycythemia: Secondary | ICD-10-CM | POA: Insufficient documentation

## 2016-07-26 NOTE — Progress Notes (Signed)
Pt came in today for scheduled therapeutic phlebotomy.  Left AC was used.  250 cc of blood was removed.  PT tolerated  Procedure well.

## 2016-08-04 ENCOUNTER — Other Ambulatory Visit (HOSPITAL_COMMUNITY): Payer: Self-pay | Admitting: *Deleted

## 2016-08-05 ENCOUNTER — Encounter (HOSPITAL_COMMUNITY): Payer: Medicare Other

## 2016-08-10 ENCOUNTER — Encounter (HOSPITAL_COMMUNITY)
Admission: RE | Admit: 2016-08-10 | Discharge: 2016-08-10 | Disposition: A | Discharge status: Home / Self Care | Payer: Medicare Other | Source: Ambulatory Visit | Attending: Nephrology | Admitting: Nephrology

## 2016-08-10 DIAGNOSIS — D751 Secondary polycythemia: Secondary | ICD-10-CM | POA: Diagnosis not present

## 2016-08-10 NOTE — Progress Notes (Addendum)
Pull back for phlebotomy  via right arm PIV stopped with 5cc of blood removed.  Dr Shirl Harris notified.  Ok to use left arm for PIV start for phlebotomy.

## 2016-08-10 NOTE — Progress Notes (Signed)
Phlebotomy stopped with 5cc removed, PIV clotted.  Dr Moshe Cipro notified ok for pt to leave.

## 2016-08-10 NOTE — Progress Notes (Signed)
x2 unsuccessful IV starts.  IV team consult order  placed

## 2016-08-11 ENCOUNTER — Encounter: Payer: Medicare Other | Admitting: Family Medicine

## 2016-08-26 ENCOUNTER — Ambulatory Visit (INDEPENDENT_AMBULATORY_CARE_PROVIDER_SITE_OTHER): Payer: Medicare Other | Admitting: Family Medicine

## 2016-08-26 ENCOUNTER — Encounter: Payer: Self-pay | Admitting: Family Medicine

## 2016-08-26 VITALS — BP 157/90 | HR 91 | Temp 98.4°F | Ht 73.0 in | Wt 164.6 lb

## 2016-08-26 DIAGNOSIS — Z89512 Acquired absence of left leg below knee: Secondary | ICD-10-CM

## 2016-08-26 DIAGNOSIS — Z23 Encounter for immunization: Secondary | ICD-10-CM

## 2016-08-26 DIAGNOSIS — E1010 Type 1 diabetes mellitus with ketoacidosis without coma: Secondary | ICD-10-CM | POA: Diagnosis present

## 2016-08-26 DIAGNOSIS — Z0000 Encounter for general adult medical examination without abnormal findings: Secondary | ICD-10-CM | POA: Insufficient documentation

## 2016-08-26 DIAGNOSIS — Z89511 Acquired absence of right leg below knee: Secondary | ICD-10-CM | POA: Diagnosis not present

## 2016-08-26 NOTE — Patient Instructions (Signed)
You came in today for a complete physical exam.  You are doing well.  You were due for a flu shot, which we provided and were also due to check protein in your urine.  We discussed your chronic pain in your stumps.  I have sent you a referral to [facility].  Please follow up with your kidney doctor.   It was great to meet you.  Orra Nolde L. Rosalyn Gess, [place] Resident PGY-1 08/26/2016 11:04 AM

## 2016-08-26 NOTE — Progress Notes (Signed)
Subjective:  [name] is a 40 y.o. male who presents to the [facility] today for a complete physical exam.   HPI: [name] is doing well today, the only complaint that he has is regarding pain in his bilateral stumps.  He had relatively new prosthetics.  He has plans to follow up with the manufacturer to investigate his discomfort.  He developed a callous on his R stump that has been uncomfortable and has since been using a callous removal with good results.  He requested a referral to [facility] to help with some of his chronic pain.  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  He stated that he will be following up with them next week for a session.  He is not currently on any blood pressure medication, but his pressures have been fluctuating the last several months.  Today his pressure was 157/90 and repeat was 142/82.  He denies any CP, SOB, changes in his vision.  He previously had type one diabetes, but his A1C has been fantastic since his pancreas transplant.  In April his A1C was 5.5.     Objective:  Physical Exam: BP (!) 157/90   Pulse 91   Temp 98.4 F (36.9 C) (Oral)   Ht 6\' 1"  (1.854 m)   Wt 164 lb 9.6 oz (74.7 kg)   BMI 21.72 kg/m   Gen: 40 yo M NAD, resting comfortably CV: RRR with no murmurs appreciated Pulm: NWOB, CTAB with no crackles, wheezes, or rhonchi GI: Normal bowel sounds present. Soft, Nontender, Nondistended. MSK: no edema, cyanosis, or clubbing noted Ext: bilateral BKAl; stumps well healed with R callous, no ulcerations or tenderness Skin: warm, dry Neuro: grossly normal, moves all extremities Psych: Normal affect and thought content  Results for orders placed or performed in visit on 08/26/16 (from the past 72 hour(s))    Microalbumin, urine     Status: None   Collection Time: 08/26/16 11:05 AM  Result Value Ref Range   Microalb, Ur 0.7 Not estab mg/dL    Comment: The ADA has defined abnormalities in albumin excretion as follows:           Category           Result                            (mcg/mg creatinine)                 Normal:    <30       Microalbuminuria:    30 - 299   Clinical albuminuria:    > or = 300   The ADA recommends that at least two of three specimens collected within a 3 - 6 month period be abnormal before considering a patient to be within a diagnostic category.        Assessment/Plan:  Healthcare maintenance Patient is doing well overall given his significant past medical history.  He does not smoke or use alcohol or drugs and has a great outlook on life.  Today he was due for a recheck of his microalbumin and a flu-shot.   No known FH of cancers.  No need for additional screening at this time.  - received flu-shot - checked microalbumin: <  0.7   S/P bilateral BKA (below knee amputation) ([place]) Patient has chronic bilateral stump pain that has recently worsened due to his new prosthetics.  He has already reached out to the manufacturer reps to discuss his issues.  He requested a referral to [facility].   - made referral to pain doctor

## 2016-08-26 NOTE — Assessment & Plan Note (Addendum)
Patient is doing well overall given his significant past medical history.  He does not smoke or use alcohol or drugs and has a great outlook on life.  Today he was due for a recheck of his microalbumin and a flu-shot.   No known FH of cancers.  No need for additional screening at this time.  - received flu-shot - checked microalbumin: <0.7

## 2016-08-26 NOTE — Assessment & Plan Note (Signed)
Patient has chronic bilateral stump pain that has recently worsened due to his new prosthetics.  He has already reached out to the manufacturer reps to discuss his issues.  He requested a referral to [facility].   - made referral to pain doctor

## 2016-08-27 LAB — MICROALBUMIN, URINE: MICROALB UR: 0.7 mg/dL

## 2016-10-11 ENCOUNTER — Emergency Department (HOSPITAL_COMMUNITY): Payer: Medicare Other

## 2016-10-11 ENCOUNTER — Emergency Department (HOSPITAL_COMMUNITY)
Admission: EM | Admit: 2016-10-11 | Discharge: 2016-10-12 | Disposition: A | Discharge status: Home / Self Care | Payer: Medicare Other | Attending: Emergency Medicine | Admitting: Emergency Medicine

## 2016-10-11 ENCOUNTER — Encounter (HOSPITAL_COMMUNITY): Payer: Self-pay

## 2016-10-11 DIAGNOSIS — Z940 Kidney transplant status: Secondary | ICD-10-CM | POA: Diagnosis not present

## 2016-10-11 DIAGNOSIS — K2901 Acute gastritis with bleeding: Secondary | ICD-10-CM | POA: Insufficient documentation

## 2016-10-11 DIAGNOSIS — Z7982 Long term (current) use of aspirin: Secondary | ICD-10-CM | POA: Insufficient documentation

## 2016-10-11 DIAGNOSIS — I120 Hypertensive chronic kidney disease with stage 5 chronic kidney disease or end stage renal disease: Secondary | ICD-10-CM | POA: Insufficient documentation

## 2016-10-11 DIAGNOSIS — N186 End stage renal disease: Secondary | ICD-10-CM | POA: Diagnosis not present

## 2016-10-11 DIAGNOSIS — Z79899 Other long term (current) drug therapy: Secondary | ICD-10-CM | POA: Insufficient documentation

## 2016-10-11 DIAGNOSIS — Z992 Dependence on renal dialysis: Secondary | ICD-10-CM | POA: Insufficient documentation

## 2016-10-11 DIAGNOSIS — E1022 Type 1 diabetes mellitus with diabetic chronic kidney disease: Secondary | ICD-10-CM | POA: Diagnosis not present

## 2016-10-11 DIAGNOSIS — R1084 Generalized abdominal pain: Secondary | ICD-10-CM | POA: Diagnosis present

## 2016-10-11 LAB — URINALYSIS, ROUTINE W REFLEX MICROSCOPIC
Glucose, UA: NEGATIVE mg/dL
HGB URINE DIPSTICK: NEGATIVE
KETONES UR: NEGATIVE mg/dL
Leukocytes, UA: NEGATIVE
NITRITE: NEGATIVE
PROTEIN: 30 mg/dL — AB
SPECIFIC GRAVITY, URINE: 1.026 (ref 1.005–1.030)
pH: 6 (ref 5.0–8.0)

## 2016-10-11 LAB — URINE MICROSCOPIC-ADD ON

## 2016-10-11 LAB — I-STAT CHEM 8, ED
BUN: 26 mg/dL — ABNORMAL HIGH (ref 6–20)
CHLORIDE: 96 mmol/L — AB (ref 101–111)
Calcium, Ion: 1.11 mmol/L — ABNORMAL LOW (ref 1.15–1.40)
Creatinine, Ser: 1.7 mg/dL — ABNORMAL HIGH (ref 0.61–1.24)
GLUCOSE: 90 mg/dL (ref 65–99)
HEMATOCRIT: 59 % — AB (ref 39.0–52.0)
HEMOGLOBIN: 20.1 g/dL — AB (ref 13.0–17.0)
POTASSIUM: 4.1 mmol/L (ref 3.5–5.1)
SODIUM: 137 mmol/L (ref 135–145)
TCO2: 31 mmol/L (ref 0–100)

## 2016-10-11 LAB — COMPREHENSIVE METABOLIC PANEL
ALBUMIN: 3.8 g/dL (ref 3.5–5.0)
ALT: 15 U/L — ABNORMAL LOW (ref 17–63)
ANION GAP: 9 (ref 5–15)
AST: 16 U/L (ref 15–41)
Alkaline Phosphatase: 81 U/L (ref 38–126)
BUN: 22 mg/dL — AB (ref 6–20)
CHLORIDE: 99 mmol/L — AB (ref 101–111)
CO2: 28 mmol/L (ref 22–32)
Calcium: 9 mg/dL (ref 8.9–10.3)
Creatinine, Ser: 1.77 mg/dL — ABNORMAL HIGH (ref 0.61–1.24)
GFR calc Af Amer: 53 mL/min — ABNORMAL LOW (ref >60)
GFR, EST NON AFRICAN AMERICAN: 46 mL/min — AB (ref >60)
Glucose, Bld: 94 mg/dL (ref 65–99)
POTASSIUM: 3.9 mmol/L (ref 3.5–5.1)
Sodium: 136 mmol/L (ref 135–145)
Total Bilirubin: 1.1 mg/dL (ref 0.3–1.2)
Total Protein: 7.4 g/dL (ref 6.5–8.1)

## 2016-10-11 LAB — CBC
HEMATOCRIT: 52.4 % — AB (ref 39.0–52.0)
HEMOGLOBIN: 17.4 g/dL — AB (ref 13.0–17.0)
MCH: 31 pg (ref 26.0–34.0)
MCHC: 33.2 g/dL (ref 30.0–36.0)
MCV: 93.4 fL (ref 78.0–100.0)
Platelets: 223 10*3/uL (ref 150–400)
RBC: 5.61 MIL/uL (ref 4.22–5.81)
RDW: 14.5 % (ref 11.5–15.5)
WBC: 7 10*3/uL (ref 4.0–10.5)

## 2016-10-11 LAB — LIPASE, BLOOD: LIPASE: 26 U/L (ref 11–51)

## 2016-10-11 LAB — POC OCCULT BLOOD, ED: FECAL OCCULT BLD: NEGATIVE

## 2016-10-11 MED ORDER — SODIUM CHLORIDE 0.9 % IV BOLUS (SEPSIS)
1000.0000 mL | Freq: Once | INTRAVENOUS | Status: AC
  Administered 2016-10-11: 1000 mL via INTRAVENOUS

## 2016-10-11 MED ORDER — HYDROMORPHONE HCL 1 MG/ML IJ SOLN
1.0000 mg | Freq: Once | INTRAMUSCULAR | Status: AC
  Administered 2016-10-11: 1 mg via INTRAVENOUS
  Filled 2016-10-11: qty 1

## 2016-10-11 MED ORDER — OXYCODONE-ACETAMINOPHEN 5-325 MG PO TABS: 1.0000 | ORAL_TABLET | Freq: Once | ORAL | Status: DC

## 2016-10-11 MED ORDER — PROMETHAZINE HCL 25 MG/ML IJ SOLN
25.0000 mg | Freq: Once | INTRAMUSCULAR | Status: AC
  Administered 2016-10-11: 25 mg via INTRAVENOUS
  Filled 2016-10-11: qty 1

## 2016-10-11 MED ORDER — ONDANSETRON HCL 4 MG/2ML IJ SOLN
4.0000 mg | Freq: Once | INTRAMUSCULAR | Status: AC
  Administered 2016-10-12: 4 mg via INTRAVENOUS
  Filled 2016-10-11: qty 2

## 2016-10-11 NOTE — ED Provider Notes (Signed)
[facility] DEPT Provider Note   CSN: [id number] Arrival date & time: 10/11/16  1859     History   Chief Complaint Chief Complaint  Patient presents with  . Abdominal Pain  . Hematemesis    HPI [name] is a 40 y.o. male.  [name] is a 40 yo man with PMH DM1 (with bilateral BKA, gastroparesis) and ESRD, resolved s/p pancreas and kidney transplant in 2014, immunosuppression on prednisone, prograf,  GERD, who presents for severe abdominal pain, nausea/vomiting, and hematemesis. His nausea/vomiting began yesterday morning and has been progressive and severe, he has been unable to tolerate food/liquid intake and also reports occasional coffee-ground emesis and bright red blood. Generalized abdominal pain set in shortly after the N/V, which he states is severe and constant with intermittent waves of severe pain. He reports burning pain in his chest,  Dizziness, and chills. He reports addition of losartan is only recent med changes. He tried taking Zofran ODT, po Zofran, and Phenergan with no relief. He denies chest pressure, shortness of breath, fevers, diarrhea, or constipation (passing gas, LBM 2 days ago).    The history is provided by the patient. No language interpreter was used.    Past Medical History:  Diagnosis Date  . 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 Arthritis    "I think I do; just in my fingers & my hands"  . Blood transfusion   . Chronic pain   . Depression   . Dialysis patient [facility]) 04/18/12   "[facility]; Tues, [place], [place]"  . Edema   . ESRD (end stage renal disease) on dialysis ([place])   . Gastroparesis   . Gastropathy   . GERD (gastroesophageal reflux disease)   . Headache(784.0)   . Hypertension   . Type I diabetes mellitus ([place])    "juvenile"    Patient Active Problem List   Diagnosis Date Noted  . Healthcare maintenance 08/26/2016  . Coffee ground emesis 12/17/2015  . Gastroparesis 12/16/2015  . Back pain  12/15/2015  . Non-intractable vomiting with nausea 07/04/2015  . Nausea & vomiting 07/03/2015  . History of diabetes mellitus 02/20/2015  . Abdominal pain 09/10/2014  . Intractable nausea and vomiting 09/09/2014  . Stage II pressure ulcer 07/29/2014  . Weakness of left upper extremity 07/03/2014  . Impaired mobility and activities of daily living 07/03/2014  . Sternoclavicular joint pain 03/04/2014  . History of transplantation, renal 07/19/2013  . History of pancreas transplant ([place]) 07/19/2013  . Immunosuppressed status ([place]) 06/27/2013  . H/O pancreas transplant ([place]) 06/27/2013  . Diabetic gastroparesis associated with type 1 diabetes mellitus ([place]) 05/08/2013  . BKA stump complication ([place]) 48/18/5631  . S/P bilateral BKA (below knee amputation) ([place]) 08/17/2012  . Diabetic gastroparesis ([place]) 07/20/2011  . Metabolic bone disease 49/70/2637  . ESRD (end stage renal disease) ([place]) 11/17/2009  . GERD 03/14/2007  . HLD (hyperlipidemia) 02/22/2007  . Major depressive disorder, recurrent episode ([place]) 02/22/2007  . POST TRAUMATIC STRESS DISORDER 02/22/2007  . HYPERTENSION, BENIGN SYSTEMIC 02/22/2007  . 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 AV FISTULA PLACEMENT  08/2011   left upper arm  . BELOW KNEE LEG AMPUTATION  "it's been awhile"   bilaterally  . CATARACT EXTRACTION  ~ 2011   right  . COMBINED KIDNEY-PANCREAS TRANSPLANT         Home Medications    Prior to Admission medications   Medication Sig Start Date  End Date Taking? Authorizing Provider  aspirin EC 81 MG tablet Take 81 mg by mouth daily.   Yes Historical Provider, MD  losartan (COZAAR) 50 MG tablet Take 50 mg by mouth daily.  09/08/16  Yes Historical Provider, MD  metoCLOPramide (REGLAN) 10 MG tablet Take 1 tablet (10 mg total) by mouth every 6 (six) hours as needed for nausea. 12/17/15  Yes [place] N Rumley, DO  mycophenolate (MYFORTIC) 180 MG EC tablet Take 360  mg by mouth 2 (two) times daily.  07/06/13  Yes Historical Provider, MD  omeprazole (PRILOSEC) 20 MG capsule Take 1 capsule (20 mg total) by mouth daily as needed (heartburn). 06/06/14  Yes Linton Flemings, MD  ondansetron (ZOFRAN ODT) 4 MG disintegrating tablet Take 1 tablet (4 mg total) by mouth every 8 (eight) hours as needed for nausea or vomiting. 01/08/15  Yes Gareth Morgan, MD  Oxycodone HCl 10 MG TABS Take 10 mg by mouth every 6 (six) hours as needed (pain).  09/28/16  Yes Historical Provider, MD  predniSONE (DELTASONE) 5 MG tablet Take 5 mg by mouth daily.  06/20/13  Yes Historical Provider, MD  promethazine (PHENERGAN) 25 MG tablet Take 25 mg by mouth every 6 (six) hours as needed for nausea or vomiting.   Yes Historical Provider, MD  sulfamethoxazole-trimethoprim (BACTRIM,SEPTRA) 400-80 MG per tablet Take 1 tablet by mouth every Monday, Wednesday, and Friday.    Yes Historical Provider, MD  tacrolimus (PROGRAF) 1 MG capsule Take 2-3 mg by mouth 2 (two) times daily. 3 mg in the morning, 2 mg in the evening 07/16/13  Yes Historical Provider, MD  cyclobenzaprine (FLEXERIL) 10 MG tablet Take 1 tablet (10 mg total) by mouth 2 (two) times daily as needed for muscle spasms. Patient not taking: Reported on 10/11/2016 01/08/15   Gareth Morgan, MD  polyethylene glycol (MIRALAX / GLYCOLAX) packet Take 17 g by mouth daily as needed for mild constipation. Patient not taking: Reported on 10/11/2016 12/17/15   Burna Cash Rumley, DO  ranitidine (ZANTAC) 150 MG tablet Take 1 tablet (150 mg total) by mouth 2 (two) times daily. 10/12/16   Orpah Greek, MD  sucralfate (CARAFATE) 1 GM/10ML suspension Take 10 mLs (1 g total) by mouth 4 (four) times daily -  with meals and at bedtime. 10/12/16   Orpah Greek, MD    Family History Family History  Problem Relation Age of Onset  . Diabetes Other   . Hypertension Other     Social History Social History  Substance Use Topics  . Smoking status:  Never Smoker  . Smokeless tobacco: Never Used  . Alcohol use No     Allergies   Review of patient's allergies indicates no known allergies.   Review of Systems Review of Systems  Constitutional: Positive for appetite change, chills and fatigue. Negative for fever.  Respiratory: Negative for cough, chest tightness and shortness of breath.   Cardiovascular: Negative for chest pain.  Gastrointestinal: Positive for abdominal pain, nausea and vomiting. Negative for abdominal distention, anal bleeding, blood in stool, constipation and diarrhea.  Genitourinary: Negative for difficulty urinating, dysuria and flank pain.  All other systems reviewed and are negative.    Physical Exam Updated Vital Signs BP 130/77   Pulse 71   Temp 98.8 F (37.1 C) (Oral)   Resp 17   Ht 6\' 1"  (1.854 m)   Wt 78.5 kg   SpO2 100%   BMI 22.82 kg/m   Physical Exam  Constitutional: He is oriented  to person, place, and time. He appears well-developed. He appears distressed.  thin  HENT:  Head: Normocephalic and atraumatic.  Mouth/Throat: Oropharynx is clear and moist.  Eyes: EOM are normal. Pupils are equal, round, and reactive to light.  Neck: Normal range of motion. Neck supple.  Cardiovascular: Normal rate, regular rhythm, normal heart sounds and intact distal pulses.  Exam reveals no gallop and no friction rub.   No murmur heard. Pulmonary/Chest: Effort normal and breath sounds normal. No respiratory distress. He has no wheezes. He has no rales.  Abdominal: Soft. Bowel sounds are normal. He exhibits no distension and no mass. There is tenderness (generalized). There is no rebound and no guarding.  Musculoskeletal: Normal range of motion. He exhibits no edema or tenderness.  Bilateral BKA  Neurological: He is alert and oriented to person, place, and time.  Skin: Skin is warm and dry. Capillary refill takes less than 2 seconds. He is not diaphoretic.  Psychiatric: He has a normal mood and affect.  His behavior is normal. Thought content normal.  Tearful affect    ED Treatments / Results  Labs (all labs ordered are listed, but only abnormal results are displayed) Labs Reviewed  COMPREHENSIVE METABOLIC PANEL - Abnormal; Notable for the following:       Result Value   Chloride 99 (*)    BUN 22 (*)    Creatinine, Ser 1.77 (*)    ALT 15 (*)    GFR calc non Af Amer 46 (*)    GFR calc Af Amer 53 (*)    All other components within normal limits  CBC - Abnormal; Notable for the following:    Hemoglobin 17.4 (*)    HCT 52.4 (*)    All other components within normal limits  URINALYSIS, ROUTINE W REFLEX MICROSCOPIC (NOT AT [facility]) - Abnormal; Notable for the following:    Color, Urine AMBER (*)    Bilirubin Urine MODERATE (*)    Protein, ur 30 (*)    All other components within normal limits  URINE MICROSCOPIC-ADD ON - Abnormal; Notable for the following:    Squamous Epithelial / LPF 0-5 (*)    Bacteria, UA FEW (*)    All other components within normal limits  I-STAT CHEM 8, ED - Abnormal; Notable for the following:    Chloride 96 (*)    BUN 26 (*)    Creatinine, Ser 1.70 (*)    Calcium, Ion 1.11 (*)    Hemoglobin 20.1 (*)    HCT 59.0 (*)    All other components within normal limits  LIPASE, BLOOD  POC OCCULT BLOOD, ED  TYPE AND SCREEN  ABO/RH    EKG  EKG Interpretation None       Radiology Ct Abdomen Pelvis Wo Contrast  Result Date: 10/11/2016 CLINICAL DATA:  Acute onset of generalized abdominal pain, hematemesis and dizziness. Initial encounter. EXAM: CT ABDOMEN AND PELVIS WITHOUT CONTRAST TECHNIQUE: Multidetector CT imaging of the abdomen and pelvis was performed following the standard protocol without IV contrast. COMPARISON:  CT of the abdomen and pelvis from 01/09/2016 FINDINGS: Lower chest: Minimal scarring is noted at the right lung base. The visualized portions of the mediastinum are unremarkable. Trace pericardial fluid remains within normal limits.  Hepatobiliary: The liver is unremarkable in appearance. The gallbladder is unremarkable in appearance. The common bile duct remains normal in caliber. Pancreas: The pancreas is within normal limits. Spleen: The spleen is unremarkable in appearance. Adrenals/Urinary Tract: The adrenal glands are unremarkable in  appearance. Mild to moderate bilateral native renal atrophy is noted. There is no evidence of hydronephrosis. No renal or ureteral stones are identified. No perinephric stranding is seen. Scattered vascular calcifications are seen at the renal hila bilaterally. A right iliac fossa transplant kidney is grossly unremarkable in appearance. Stomach/Bowel: The stomach is unremarkable in appearance. The small bowel is within normal limits. The appendix is normal in caliber, without evidence of appendicitis. The colon is unremarkable in appearance. Vascular/Lymphatic: Scattered calcification is seen along the abdominal aorta. Relatively diffuse calcification is noted along the superior mesenteric artery and inferior mesenteric artery. The inferior vena cava is grossly unremarkable. No retroperitoneal lymphadenopathy is seen. No pelvic sidewall lymphadenopathy is identified. Reproductive: The bladder is mildly distended and grossly unremarkable. The prostate is enlarged, measuring 5.5 cm in transverse dimension. Other: No additional soft tissue abnormalities are seen. Musculoskeletal: No acute osseous abnormalities are identified. The visualized musculature is unremarkable in appearance. IMPRESSION: 1. No acute abnormality seen to explain the patient's symptoms. 2. Right iliac fossa transplant kidney is grossly unremarkable. 3. Mild-to-moderate bilateral native renal atrophy noted. Associated vascular calcifications at the renal hila bilaterally. 4. Diffuse calcification along the superior and inferior mesenteric arteries; the underlying bowel is grossly unremarkable in appearance. Scattered aortic atherosclerosis  noted. 5. Enlarged prostate noted. Electronically Signed   By: Garald Balding M.D.   On: 10/11/2016 23:32    Procedures Procedures (including critical care time)  Medications Ordered in ED Medications  HYDROmorphone (DILAUDID) injection 1 mg (1 mg Intravenous Given 10/11/16 2347)  ondansetron (ZOFRAN) injection 4 mg (4 mg Intravenous Given 10/12/16 0027)  promethazine (PHENERGAN) injection 25 mg (25 mg Intravenous Given 10/11/16 2343)  sodium chloride 0.9 % bolus 1,000 mL (0 mLs Intravenous Stopped 10/12/16 0118)  sodium chloride 0.9 % bolus 1,000 mL (0 mLs Intravenous Stopped 10/12/16 0224)  HYDROmorphone (DILAUDID) injection 1 mg (1 mg Intravenous Given 10/12/16 0118)  ondansetron (ZOFRAN) injection 4 mg (4 mg Intravenous Given 10/12/16 0223)  metoCLOPramide (REGLAN) injection 10 mg (10 mg Intravenous Given 10/12/16 0441)  gi cocktail (Maalox,Lidocaine,Donnatal) (30 mLs Oral Given 10/12/16 0440)     Initial Impression / Assessment and Plan / ED Course  I have reviewed the triage vital signs and the nursing notes.  Pertinent labs & imaging results that were available during my care of the patient were reviewed by me and considered in my medical decision making (see chart for details).  Clinical Course   Mr. Shedd is 40 yo gentleman with PMH DM1/ESRD now s/p pancreas and kidney transplants, GERD, and gastroparesis present with 2 days of severe nausea/vomiting and abdominal pain. He endorses coffee ground and bright red hematemesis. Clinical history concerning for SBO vs mallory-weiss/esophageal injury vs severe gastroparesis. Provided pain, nausea control, IVF, obtain CT abdomen with revealed no acute abnormality. Labs unremarkable except for mild AKI Cr 1.7. FOBT negative. Symptoms likely secondary to acute gastritis and or gastroparesis. Patient stable, discharged home with PCP follow up and return precautions.   Final Clinical Impressions(s) / ED Diagnoses   Final diagnoses:    Acute gastritis with hemorrhage, unspecified gastritis type    New Prescriptions Discharge Medication List as of 10/12/2016  4:34 AM    START taking these medications   Details  ranitidine (ZANTAC) 150 MG tablet Take 1 tablet (150 mg total) by mouth 2 (two) times daily., Starting Wed 10/12/2016, Print    sucralfate (CARAFATE) 1 GM/10ML suspension Take 10 mLs (1 g total) by mouth 4 (four) times daily -  with meals and at bedtime., Starting Wed 10/12/2016, Print         Asencion Partridge, MD 10/12/16 [place] Yao, MD 10/14/16 [phone]

## 2016-10-11 NOTE — ED Notes (Signed)
Kerin Ransom stuck pt was and was unsuccessful. EDP Darl Householder currently attempting to start a line and collect labs

## 2016-10-11 NOTE — ED Triage Notes (Addendum)
PT C/O ABDOMINAL PAIN WITH HEMATEMESIS AND DIZZINESS SINCE YESTERDAY. DENIES FEVER OR DIARRHEA. PT HAS A HX OF PANCREAS AND RIGHT KIDNEY TRANSPLANTS in June/2014.

## 2016-10-11 NOTE — ED Notes (Signed)
Provider In room doing procedure IV stick

## 2016-10-12 DIAGNOSIS — K2901 Acute gastritis with bleeding: Secondary | ICD-10-CM | POA: Diagnosis not present

## 2016-10-12 LAB — TYPE AND SCREEN
ABO/RH(D): O POS
Antibody Screen: NEGATIVE

## 2016-10-12 LAB — ABO/RH: ABO/RH(D): O POS

## 2016-10-12 MED ORDER — HYDROMORPHONE HCL 1 MG/ML IJ SOLN
1.0000 mg | Freq: Once | INTRAMUSCULAR | Status: AC
  Administered 2016-10-12: 1 mg via INTRAVENOUS
  Filled 2016-10-12: qty 1

## 2016-10-12 MED ORDER — METOCLOPRAMIDE HCL 5 MG/ML IJ SOLN
10.0000 mg | Freq: Once | INTRAMUSCULAR | Status: AC
  Administered 2016-10-12: 10 mg via INTRAVENOUS
  Filled 2016-10-12: qty 2

## 2016-10-12 MED ORDER — SODIUM CHLORIDE 0.9 % IV BOLUS (SEPSIS)
1000.0000 mL | Freq: Once | INTRAVENOUS | Status: AC
  Administered 2016-10-12: 1000 mL via INTRAVENOUS

## 2016-10-12 MED ORDER — GI COCKTAIL ~~LOC~~
30.0000 mL | Freq: Once | ORAL | Status: AC
  Administered 2016-10-12: 30 mL via ORAL
  Filled 2016-10-12: qty 30

## 2016-10-12 MED ORDER — SUCRALFATE 1 GM/10ML PO SUSP: 1.0000 g | Freq: Three times a day (TID) | ORAL | 0 refills | Status: DC

## 2016-10-12 MED ORDER — ONDANSETRON HCL 4 MG/2ML IJ SOLN
4.0000 mg | Freq: Once | INTRAMUSCULAR | Status: AC
  Administered 2016-10-12: 4 mg via INTRAVENOUS
  Filled 2016-10-12: qty 2

## 2016-10-12 MED ORDER — RANITIDINE HCL 150 MG PO TABS: 150.0000 mg | ORAL_TABLET | Freq: Two times a day (BID) | ORAL | 0 refills | Status: DC

## 2016-10-12 NOTE — ED Provider Notes (Signed)
Patient signed out to me to receive additional fluid hydration. He was seen with complaints of nausea and vomiting with hematemesis. Patient was gastric or negative, no evidence of ongoing bleeding. Did not have any further vomiting here in the ER, although continued to complain of some pain and nausea. He was aggressively hydrated and medicated. He will have additional treatment for presumed gastritis, follow-up with his primary doctor for referral to gastroenterology.   Orpah Greek, MD 10/12/16 [phone]

## 2016-10-12 NOTE — ED Notes (Signed)
Discharge instructions, follow up care, and rx x2 reviewed with patient. Patient verbalized understanding. 

## 2016-11-08 IMAGING — CT CT ABD-PELV W/O CM
2 of 4 series · 15 of 46 positions shown, 17 images · non-contrast
Comparison: CT of the abdomen and pelvis from 01/09/2016

CLINICAL DATA: Acute onset of generalized abdominal pain,
hematemesis and dizziness. Initial encounter.

EXAM:
CT ABDOMEN AND PELVIS WITHOUT CONTRAST
TECHNIQUE: Multidetector CT imaging of the abdomen and pelvis was performed
following the standard protocol without IV contrast.

[Series 2: abd/pel w/o · axial · non-contrast · 0.75mm/px · z∈[+862,+1327]mm · 12 of 103 slices shown, 14 images]
[im 5/103  soft-tissue]
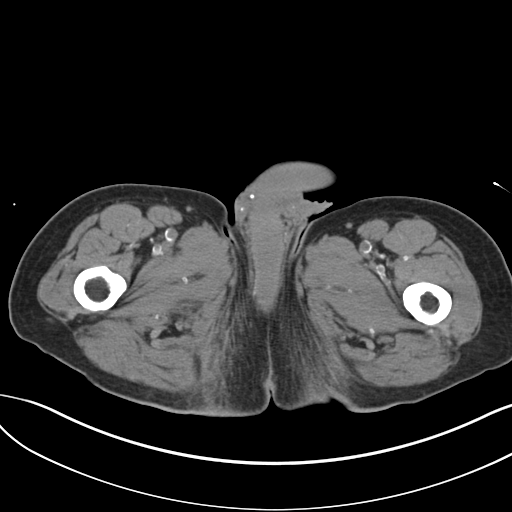
[im 5/103  bone]
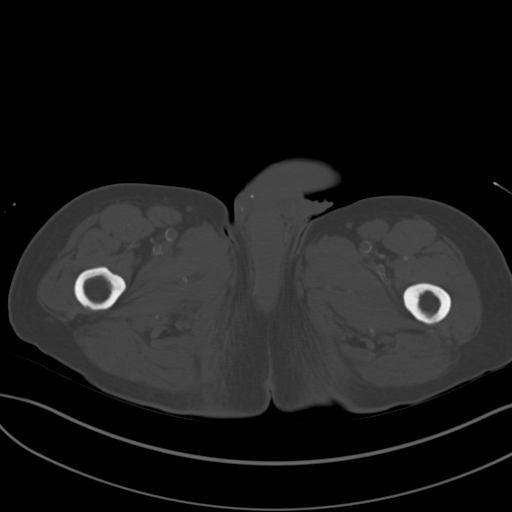
[im 15/103  soft-tissue]
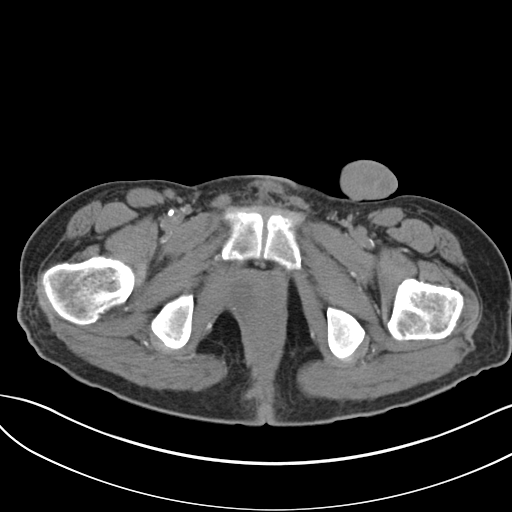
[im 25/103  soft-tissue]
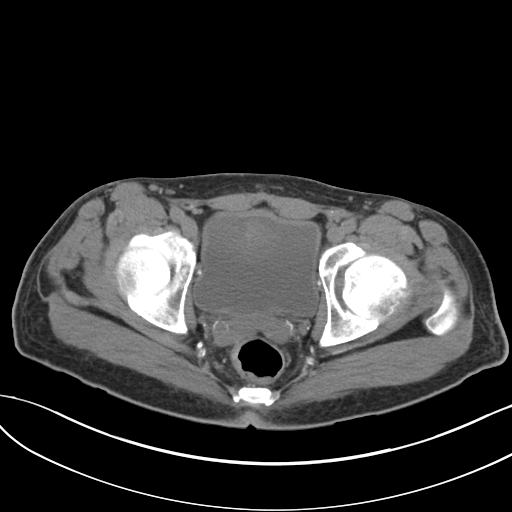
[im 30/103  soft-tissue]
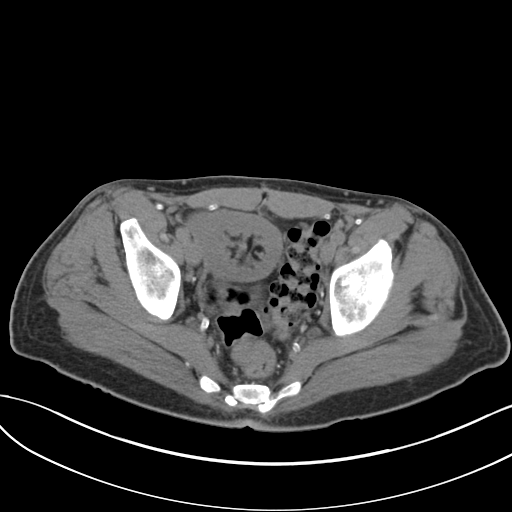
[im 39/103  soft-tissue]
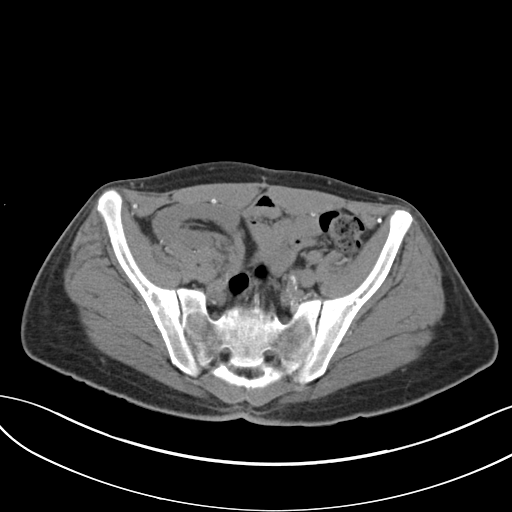
[im 49/103  soft-tissue]
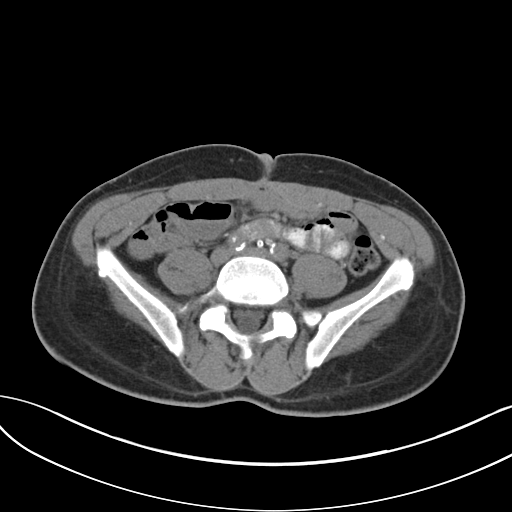
[im 54/103  soft-tissue]
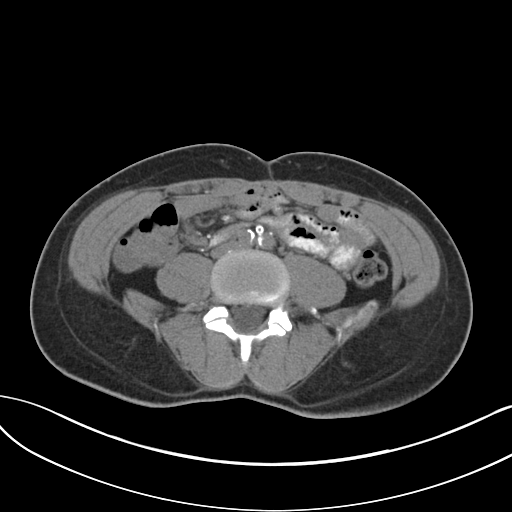
[im 64/103  soft-tissue]
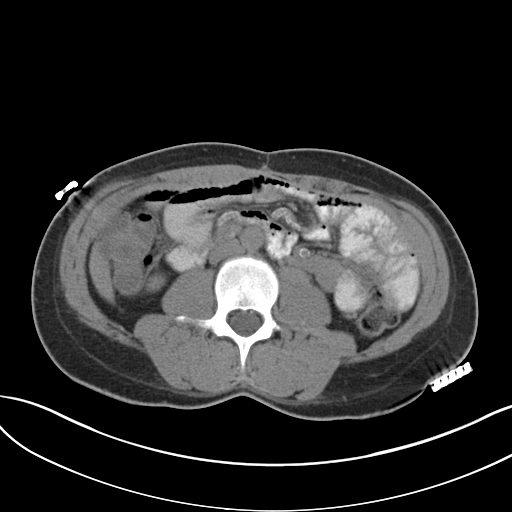
[im 73/103  soft-tissue]
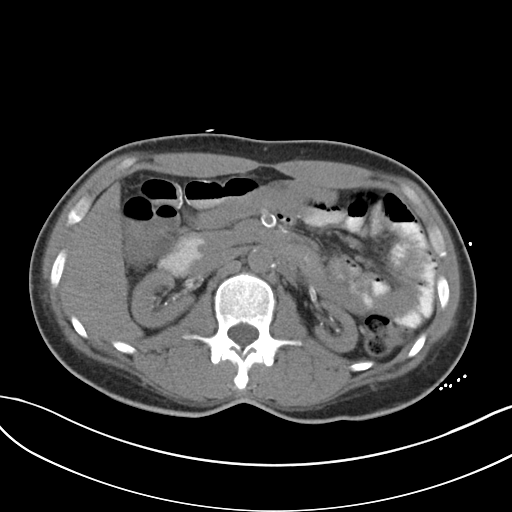
[im 73/103  bone]
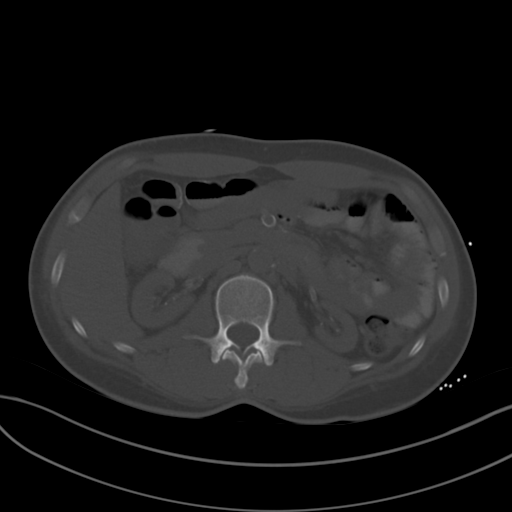
[im 78/103  soft-tissue]
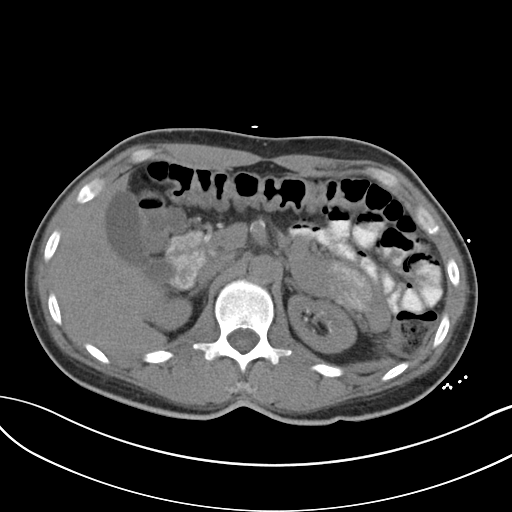
[im 88/103  soft-tissue]
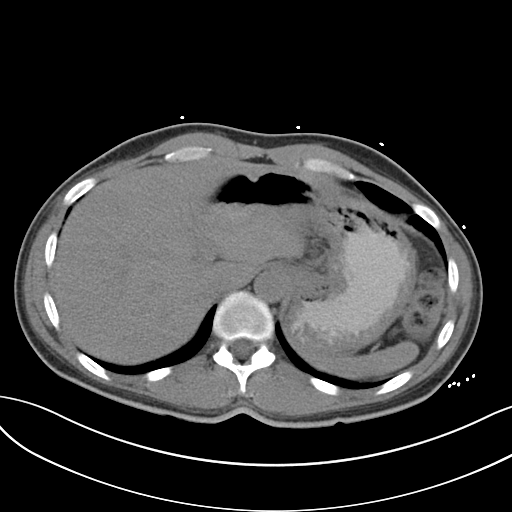
[im 98/103  soft-tissue]
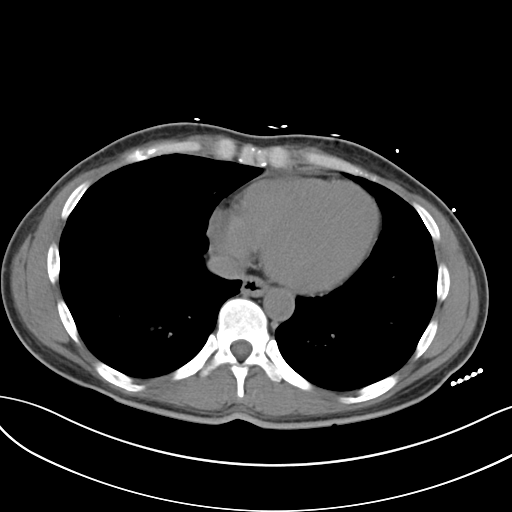

[Series 5: coronal · coronal · 0.74mm/px · 3 of 112 slices shown]
[im 38/112  soft-tissue]
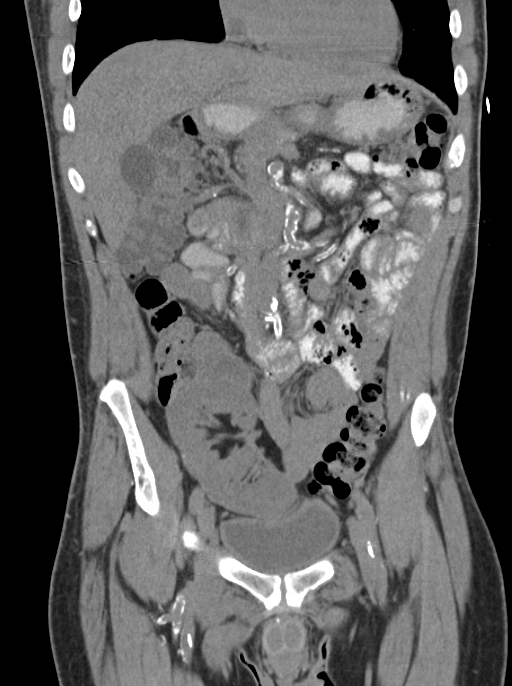
[im 50/112  soft-tissue]
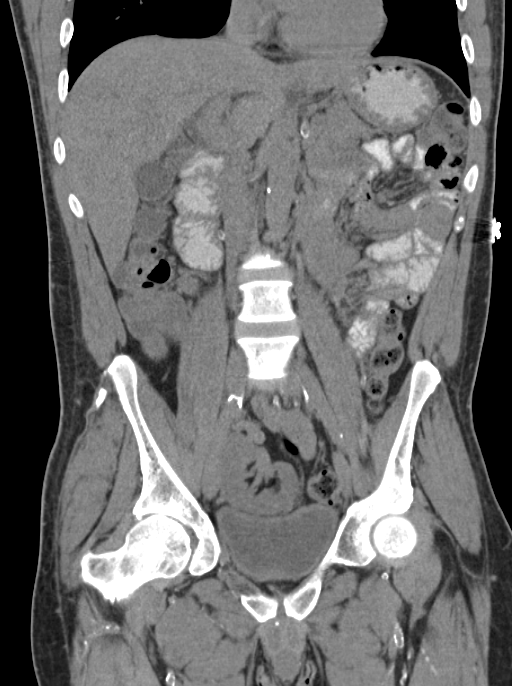
[im 62/112  soft-tissue]
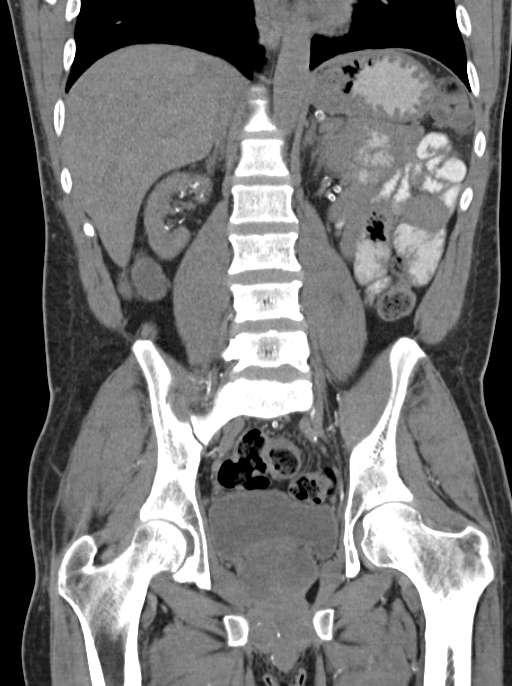

[15 of 46 positions shown; findings below may reference images not displayed]

FINDINGS: Lower chest: Minimal scarring is noted at the right lung base. The
visualized portions of the mediastinum are unremarkable. Trace
pericardial fluid remains within normal limits.

Hepatobiliary: The liver is unremarkable in appearance. The
gallbladder is unremarkable in appearance. The common bile duct
remains normal in caliber.

Pancreas: The pancreas is within normal limits.

Spleen: The spleen is unremarkable in appearance.

Adrenals/Urinary Tract: The adrenal glands are unremarkable in
appearance.

Mild to moderate bilateral native renal atrophy is noted. There is
no evidence of hydronephrosis. No renal or ureteral stones are
identified. No perinephric stranding is seen. Scattered vascular
calcifications are seen at the renal hila bilaterally.

A right iliac fossa transplant kidney is grossly unremarkable in
appearance.

Stomach/Bowel: The stomach is unremarkable in appearance. The small
bowel is within normal limits. The appendix is normal in caliber,
without evidence of appendicitis. The colon is unremarkable in
appearance.

Vascular/Lymphatic: Scattered calcification is seen along the
abdominal aorta. Relatively diffuse calcification is noted along the
superior mesenteric artery and inferior mesenteric artery. The
inferior vena cava is grossly unremarkable. No retroperitoneal
lymphadenopathy is seen. No pelvic sidewall lymphadenopathy is
identified.

Reproductive: The bladder is mildly distended and grossly
unremarkable. The prostate is enlarged, measuring 5.5 cm in
transverse dimension.

Other: No additional soft tissue abnormalities are seen.

Musculoskeletal: No acute osseous abnormalities are identified. The
visualized musculature is unremarkable in appearance.
IMPRESSION: 1. No acute abnormality seen to explain the patient's symptoms.
2. Right iliac fossa transplant kidney is grossly unremarkable.
3. Mild-to-moderate bilateral native renal atrophy noted. Associated
vascular calcifications at the renal hila bilaterally.
4. Diffuse calcification along the superior and inferior mesenteric
arteries; the underlying bowel is grossly unremarkable in
appearance. Scattered aortic atherosclerosis noted.
5. Enlarged prostate noted.

## 2016-12-29 ENCOUNTER — Inpatient Hospital Stay (HOSPITAL_COMMUNITY): Payer: Medicare Other

## 2016-12-29 ENCOUNTER — Emergency Department (HOSPITAL_COMMUNITY): Payer: Medicare Other

## 2016-12-29 ENCOUNTER — Inpatient Hospital Stay (HOSPITAL_COMMUNITY)
Admission: EM | Admit: 2016-12-29 | Discharge: 2017-01-02 | DRG: 378 | Disposition: A | Discharge status: Home / Self Care | Payer: Medicare Other | Attending: Family Medicine | Admitting: Family Medicine

## 2016-12-29 DIAGNOSIS — R519 Headache, unspecified: Secondary | ICD-10-CM | POA: Insufficient documentation

## 2016-12-29 DIAGNOSIS — Z940 Kidney transplant status: Secondary | ICD-10-CM | POA: Diagnosis not present

## 2016-12-29 DIAGNOSIS — I161 Hypertensive emergency: Secondary | ICD-10-CM | POA: Diagnosis present

## 2016-12-29 DIAGNOSIS — I160 Hypertensive urgency: Secondary | ICD-10-CM | POA: Diagnosis present

## 2016-12-29 DIAGNOSIS — K219 Gastro-esophageal reflux disease without esophagitis: Secondary | ICD-10-CM | POA: Diagnosis present

## 2016-12-29 DIAGNOSIS — K5900 Constipation, unspecified: Secondary | ICD-10-CM | POA: Diagnosis present

## 2016-12-29 DIAGNOSIS — R1013 Epigastric pain: Secondary | ICD-10-CM | POA: Diagnosis not present

## 2016-12-29 DIAGNOSIS — Z89511 Acquired absence of right leg below knee: Secondary | ICD-10-CM | POA: Diagnosis not present

## 2016-12-29 DIAGNOSIS — N182 Chronic kidney disease, stage 2 (mild): Secondary | ICD-10-CM | POA: Diagnosis present

## 2016-12-29 DIAGNOSIS — Z79899 Other long term (current) drug therapy: Secondary | ICD-10-CM

## 2016-12-29 DIAGNOSIS — D45 Polycythemia vera: Secondary | ICD-10-CM | POA: Diagnosis present

## 2016-12-29 DIAGNOSIS — Z833 Family history of diabetes mellitus: Secondary | ICD-10-CM

## 2016-12-29 DIAGNOSIS — E1022 Type 1 diabetes mellitus with diabetic chronic kidney disease: Secondary | ICD-10-CM | POA: Diagnosis present

## 2016-12-29 DIAGNOSIS — I10 Essential (primary) hypertension: Secondary | ICD-10-CM | POA: Diagnosis present

## 2016-12-29 DIAGNOSIS — Z89512 Acquired absence of left leg below knee: Secondary | ICD-10-CM | POA: Diagnosis not present

## 2016-12-29 DIAGNOSIS — K920 Hematemesis: Secondary | ICD-10-CM | POA: Diagnosis not present

## 2016-12-29 DIAGNOSIS — Z794 Long term (current) use of insulin: Secondary | ICD-10-CM | POA: Diagnosis not present

## 2016-12-29 DIAGNOSIS — Z9483 Pancreas transplant status: Secondary | ICD-10-CM

## 2016-12-29 DIAGNOSIS — E1143 Type 2 diabetes mellitus with diabetic autonomic (poly)neuropathy: Secondary | ICD-10-CM

## 2016-12-29 DIAGNOSIS — R51 Headache: Secondary | ICD-10-CM

## 2016-12-29 DIAGNOSIS — I129 Hypertensive chronic kidney disease with stage 1 through stage 4 chronic kidney disease, or unspecified chronic kidney disease: Secondary | ICD-10-CM | POA: Diagnosis present

## 2016-12-29 DIAGNOSIS — R079 Chest pain, unspecified: Secondary | ICD-10-CM

## 2016-12-29 DIAGNOSIS — Z8249 Family history of ischemic heart disease and other diseases of the circulatory system: Secondary | ICD-10-CM

## 2016-12-29 DIAGNOSIS — K3184 Gastroparesis: Secondary | ICD-10-CM

## 2016-12-29 DIAGNOSIS — R109 Unspecified abdominal pain: Secondary | ICD-10-CM

## 2016-12-29 HISTORY — DX: Type 2 diabetes mellitus without complications: E11.9

## 2016-12-29 LAB — COMPREHENSIVE METABOLIC PANEL
ALT: 18 U/L (ref 17–63)
AST: 28 U/L (ref 15–41)
Albumin: 3.8 g/dL (ref 3.5–5.0)
Alkaline Phosphatase: 83 U/L (ref 38–126)
Anion gap: 10 (ref 5–15)
BUN: 18 mg/dL (ref 6–20)
CHLORIDE: 107 mmol/L (ref 101–111)
CO2: 24 mmol/L (ref 22–32)
Calcium: 9.1 mg/dL (ref 8.9–10.3)
Creatinine, Ser: 1.58 mg/dL — ABNORMAL HIGH (ref 0.61–1.24)
GFR, EST NON AFRICAN AMERICAN: 52 mL/min — AB (ref >60)
Glucose, Bld: 95 mg/dL (ref 65–99)
Potassium: 5.2 mmol/L — ABNORMAL HIGH (ref 3.5–5.1)
SODIUM: 141 mmol/L (ref 135–145)
Total Bilirubin: 0.8 mg/dL (ref 0.3–1.2)
Total Protein: 7.1 g/dL (ref 6.5–8.1)

## 2016-12-29 LAB — I-STAT VENOUS BLOOD GAS, ED
Acid-base deficit: 2 mmol/L (ref 0.0–2.0)
BICARBONATE: 26.6 mmol/L (ref 20.0–28.0)
O2 SAT: 69 %
PCO2 VEN: 57 mmHg (ref 44.0–60.0)
TCO2: 28 mmol/L (ref 0–100)
pH, Ven: 7.277 (ref 7.250–7.430)
pO2, Ven: 41 mmHg (ref 32.0–45.0)

## 2016-12-29 LAB — I-STAT CHEM 8, ED
BUN: 23 mg/dL — AB (ref 6–20)
CREATININE: 1.5 mg/dL — AB (ref 0.61–1.24)
Calcium, Ion: 1.12 mmol/L — ABNORMAL LOW (ref 1.15–1.40)
Chloride: 107 mmol/L (ref 101–111)
Glucose, Bld: 102 mg/dL — ABNORMAL HIGH (ref 65–99)
HEMATOCRIT: 62 % — AB (ref 39.0–52.0)
Hemoglobin: 21.1 g/dL (ref 13.0–17.0)
Potassium: 4.1 mmol/L (ref 3.5–5.1)
SODIUM: 143 mmol/L (ref 135–145)
TCO2: 29 mmol/L (ref 0–100)

## 2016-12-29 LAB — PROTIME-INR
INR: 0.93
Prothrombin Time: 12.4 seconds (ref 11.4–15.2)

## 2016-12-29 LAB — LIPASE, BLOOD: Lipase: 27 U/L (ref 11–51)

## 2016-12-29 LAB — CBC
HCT: 54.1 % — ABNORMAL HIGH (ref 39.0–52.0)
Hemoglobin: 18.5 g/dL — ABNORMAL HIGH (ref 13.0–17.0)
MCH: 31.3 pg (ref 26.0–34.0)
MCHC: 34.2 g/dL (ref 30.0–36.0)
MCV: 91.5 fL (ref 78.0–100.0)
PLATELETS: ADEQUATE 10*3/uL (ref 150–400)
RBC: 5.91 MIL/uL — AB (ref 4.22–5.81)
RDW: 18.8 % — ABNORMAL HIGH (ref 11.5–15.5)
WBC: 7.7 10*3/uL (ref 4.0–10.5)

## 2016-12-29 LAB — I-STAT CG4 LACTIC ACID, ED: Lactic Acid, Venous: 1.46 mmol/L (ref 0.5–1.9)

## 2016-12-29 LAB — TYPE AND SCREEN
ABO/RH(D): O POS
ANTIBODY SCREEN: NEGATIVE

## 2016-12-29 LAB — AMYLASE: Amylase: 186 U/L — ABNORMAL HIGH (ref 28–100)

## 2016-12-29 LAB — LACTIC ACID, PLASMA: Lactic Acid, Venous: 1.6 mmol/L (ref 0.5–1.9)

## 2016-12-29 MED ORDER — ONDANSETRON HCL 4 MG/2ML IJ SOLN
4.0000 mg | Freq: Once | INTRAMUSCULAR | Status: AC
  Administered 2016-12-29: 4 mg via INTRAVENOUS

## 2016-12-29 MED ORDER — LABETALOL HCL 5 MG/ML IV SOLN
10.0000 mg | INTRAVENOUS | Status: DC | PRN
  Administered 2016-12-29 – 2016-12-30 (×2): 10 mg via INTRAVENOUS
  Filled 2016-12-29 (×2): qty 4

## 2016-12-29 MED ORDER — SODIUM CHLORIDE 0.9 % IV BOLUS (SEPSIS)
1000.0000 mL | Freq: Once | INTRAVENOUS | Status: AC
  Administered 2016-12-29: 1000 mL via INTRAVENOUS

## 2016-12-29 MED ORDER — ONDANSETRON HCL 4 MG PO TABS: 4.0000 mg | ORAL_TABLET | Freq: Four times a day (QID) | ORAL | Status: DC | PRN

## 2016-12-29 MED ORDER — MORPHINE SULFATE (PF) 4 MG/ML IV SOLN
1.0000 mg | INTRAVENOUS | Status: DC | PRN
  Administered 2016-12-30 – 2017-01-01 (×6): 1 mg via INTRAVENOUS
  Filled 2016-12-29 (×7): qty 1

## 2016-12-29 MED ORDER — NICARDIPINE HCL IN NACL 20-0.86 MG/200ML-% IV SOLN
3.0000 mg/h | Freq: Once | INTRAVENOUS | Status: AC
  Administered 2016-12-29: 5 mg/h via INTRAVENOUS
  Filled 2016-12-29: qty 200

## 2016-12-29 MED ORDER — METOCLOPRAMIDE HCL 10 MG PO TABS
10.0000 mg | ORAL_TABLET | Freq: Four times a day (QID) | ORAL | Status: DC | PRN
  Administered 2016-12-31: 10 mg via ORAL
  Filled 2016-12-29: qty 1

## 2016-12-29 MED ORDER — GI COCKTAIL ~~LOC~~
30.0000 mL | Freq: Once | ORAL | Status: AC
Start: 2016-12-29 — End: 2016-12-30
  Administered 2016-12-30: 30 mL via ORAL
  Filled 2016-12-29 (×2): qty 30

## 2016-12-29 MED ORDER — HYDRALAZINE HCL 20 MG/ML IJ SOLN
10.0000 mg | Freq: Four times a day (QID) | INTRAMUSCULAR | Status: DC
  Administered 2016-12-29 – 2017-01-01 (×10): 10 mg via INTRAVENOUS
  Filled 2016-12-29 (×10): qty 1

## 2016-12-29 MED ORDER — FAMOTIDINE IN NACL 20-0.9 MG/50ML-% IV SOLN: 20.0000 mg | Freq: Once | INTRAVENOUS | Status: DC

## 2016-12-29 MED ORDER — ONDANSETRON HCL 4 MG/2ML IJ SOLN
4.0000 mg | Freq: Once | INTRAMUSCULAR | Status: AC
  Administered 2016-12-29: 4 mg via INTRAVENOUS
  Filled 2016-12-29: qty 2

## 2016-12-29 MED ORDER — PROMETHAZINE HCL 25 MG/ML IJ SOLN
25.0000 mg | Freq: Once | INTRAMUSCULAR | Status: AC
  Administered 2016-12-30: 25 mg via INTRAVENOUS
  Filled 2016-12-29: qty 1

## 2016-12-29 MED ORDER — PANTOPRAZOLE SODIUM 40 MG IV SOLR
40.0000 mg | Freq: Once | INTRAVENOUS | Status: AC
  Administered 2016-12-29: 40 mg via INTRAVENOUS
  Filled 2016-12-29: qty 40

## 2016-12-29 MED ORDER — ONDANSETRON HCL 4 MG/2ML IJ SOLN
INTRAMUSCULAR | Status: AC
  Filled 2016-12-29: qty 2

## 2016-12-29 MED ORDER — ONDANSETRON HCL 4 MG/2ML IJ SOLN
4.0000 mg | Freq: Four times a day (QID) | INTRAMUSCULAR | Status: DC | PRN
  Administered 2016-12-29 – 2017-01-01 (×7): 4 mg via INTRAVENOUS
  Filled 2016-12-29 (×7): qty 2

## 2016-12-29 MED ORDER — FENTANYL CITRATE (PF) 100 MCG/2ML IJ SOLN
50.0000 ug | Freq: Once | INTRAMUSCULAR | Status: AC
  Administered 2016-12-29: 50 ug via INTRAVENOUS
  Filled 2016-12-29: qty 2

## 2016-12-29 MED ORDER — HYDRALAZINE HCL 20 MG/ML IJ SOLN
10.0000 mg | INTRAMUSCULAR | Status: DC | PRN
  Administered 2016-12-29: 10 mg via INTRAVENOUS
  Filled 2016-12-29: qty 1

## 2016-12-29 MED ORDER — METOCLOPRAMIDE HCL 5 MG/ML IJ SOLN
10.0000 mg | Freq: Once | INTRAMUSCULAR | Status: AC
  Administered 2016-12-29: 10 mg via INTRAVENOUS
  Filled 2016-12-29: qty 2

## 2016-12-29 NOTE — ED Notes (Signed)
Pt requesting something to drink. Admitting MD paged and return phone. New orders received and per admitting MD pt may have clear liquids until midnight.

## 2016-12-29 NOTE — ED Notes (Signed)
This RN attempted x2 for IV access without success. 

## 2016-12-29 NOTE — Consult Note (Signed)
Reason for Consult: Hematemesis Referring Physician: Family Medicine  [name] HPI: This is a 41 year old male s/p renal and pancreas transplant from [facility] for type 1 diabetes, gastroparesis, s/p bilateral BKA, GERD, and HTN admitted for complaints of hematemesis.  In the ER he had 400 ml of hematemesis, per the resident.  In the past he reports a semi-routine occurrence with hematemesis and he averages once per 6 months.  He has some issues with GERD and he has abdominal pain, but this is from his vomiting.  Since his transplant he does not require dialysis or any insulin.  Past Medical History:  Diagnosis Date  . 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 Arthritis    "I think I do; just in my fingers & my hands"  . Blood transfusion   . Chronic pain   . Depression   . Dialysis patient [facility]) 04/18/12   "[facility]; Tues, [place], [place]"  . Edema   . ESRD (end stage renal disease) on dialysis ([place])   . Gastroparesis   . Gastropathy   . GERD (gastroesophageal reflux disease)   . Headache(784.0)   . Hypertension   . Type I diabetes mellitus ([place])    "juvenile"    Past Surgical History:  Procedure Laterality Date  . AV FISTULA PLACEMENT  08/2011   left upper arm  . BELOW KNEE LEG AMPUTATION  "it's been awhile"   bilaterally  . CATARACT EXTRACTION  ~ 2011   right  . COMBINED KIDNEY-PANCREAS TRANSPLANT      Family History  Problem Relation Age of Onset  . Diabetes Other   . Hypertension Other     Social History:  reports that he has never smoked. He has never used smokeless tobacco. He reports that he does not drink alcohol or use drugs.  Allergies: No Known Allergies  Medications:  Scheduled: . ondansetron      . promethazine  25 mg Intravenous Once   Continuous:   Results for orders placed or performed during the hospital encounter of 12/29/16 (from the past 24 hour(s))  CBC     Status: Abnormal   Collection Time: 12/29/16  1:58 PM   Result Value Ref Range   WBC 7.7 4.0 - 10.5 K/uL   RBC 5.91 (H) 4.22 - 5.81 MIL/uL   Hemoglobin 18.5 (H) 13.0 - 17.0 g/dL   HCT 54.1 (H) 39.0 - 52.0 %   MCV 91.5 78.0 - 100.0 fL   MCH 31.3 26.0 - 34.0 pg   MCHC 34.2 30.0 - 36.0 g/dL   RDW 18.8 (H) 11.5 - 15.5 %   Platelets  150 - 400 K/uL    PLATELET CLUMPS NOTED ON SMEAR, COUNT APPEARS ADEQUATE  Protime-INR     Status: None   Collection Time: 12/29/16  2:13 PM  Result Value Ref Range   Prothrombin Time 12.4 11.4 - 15.2 seconds   INR 0.93   I-Stat CG4 Lactic Acid, ED     Status: None   Collection Time: 12/29/16  2:46 PM  Result Value Ref Range   Lactic Acid, Venous 1.46 0.5 - 1.9 mmol/L  Type and screen [place]     Status: None   Collection Time: 12/29/16  3:14 PM  Result Value Ref Range   ABO/RH(D) O POS    Antibody Screen NEG    Sample Expiration 01/01/2017   Lactic acid, plasma     Status: None   Collection Time: 12/29/16  3:14 PM  Result Value Ref Range   Lactic Acid, Venous 1.6 0.5 - 1.9 mmol/L  Comprehensive metabolic panel     Status: Abnormal   Collection Time: 12/29/16  3:18 PM  Result Value Ref Range   Sodium 141 135 - 145 mmol/L   Potassium 5.2 (H) 3.5 - 5.1 mmol/L   Chloride 107 101 - 111 mmol/L   CO2 24 22 - 32 mmol/L   Glucose, Bld 95 65 - 99 mg/dL   BUN 18 6 - 20 mg/dL   Creatinine, Ser 1.58 (H) 0.61 - 1.24 mg/dL   Calcium 9.1 8.9 - 10.3 mg/dL   Total Protein 7.1 6.5 - 8.1 g/dL   Albumin 3.8 3.5 - 5.0 g/dL   AST 28 15 - 41 U/L   ALT 18 17 - 63 U/L   Alkaline Phosphatase 83 38 - 126 U/L   Total Bilirubin 0.8 0.3 - 1.2 mg/dL   GFR calc non Af Amer 52 (L) >60 mL/min   GFR calc Af Amer >60 >60 mL/min   Anion gap 10 5 - 15  I-stat chem 8, ed     Status: Abnormal   Collection Time: 12/29/16  3:51 PM  Result Value Ref Range   Sodium 143 135 - 145 mmol/L   Potassium 4.1 3.5 - 5.1 mmol/L   Chloride 107 101 - 111 mmol/L   BUN 23 (H) 6 - 20 mg/dL   Creatinine, Ser 1.50 (H) 0.61 -  1.24 mg/dL   Glucose, Bld 102 (H) 65 - 99 mg/dL   Calcium, Ion 1.12 (L) 1.15 - 1.40 mmol/L   TCO2 29 0 - 100 mmol/L   Hemoglobin 21.1 (HH) 13.0 - 17.0 g/dL   HCT 62.0 (H) 39.0 - 52.0 %  I-Stat venous blood gas, ED     Status: None   Collection Time: 12/29/16  3:51 PM  Result Value Ref Range   pH, Ven 7.277 7.250 - 7.430   pCO2, Ven 57.0 44.0 - 60.0 mmHg   pO2, Ven 41.0 32.0 - 45.0 mmHg   Bicarbonate 26.6 20.0 - 28.0 mmol/L   TCO2 28 0 - 100 mmol/L   O2 Saturation 69.0 %   Acid-base deficit 2.0 0.0 - 2.0 mmol/L   Patient temperature HIDE    Collection site BRACHIAL ARTERY    Sample type VENOUS   Lipase, blood     Status: None   Collection Time: 12/29/16  4:00 PM  Result Value Ref Range   Lipase 27 11 - 51 U/L  Amylase     Status: Abnormal   Collection Time: 12/29/16  4:00 PM  Result Value Ref Range   Amylase 186 (H) 28 - 100 U/L     Ct Head Wo Contrast  Result Date: 12/29/2016 CLINICAL DATA:  Headache and emesis EXAM: CT HEAD WITHOUT CONTRAST TECHNIQUE: Contiguous axial images were obtained from the base of the skull through the vertex without intravenous contrast. COMPARISON:  January 08, 2015 FINDINGS: Brain: The ventricles are normal in size and configuration. There is a small cavum septum pellucidum, an anatomic variant. There is no intracranial mass, hemorrhage, extra-axial fluid collection, or midline shift. Gray-white compartments appear normal. No acute infarct evident. Vascular: There is no hyperdense vessel. There are foci of calcification in distal vertebral arteries bilaterally as well as in the carotid siphon regions bilaterally. Skull: Bony calvarium appears intact. Sinuses/Orbits: There is slight mucosal thickening in each maxillary antrum. There is opacification in several ethmoid air cells bilaterally. Other paranasal sinuses  are clear. Orbits appear symmetric bilaterally. Other: There is opacification in several inferior mastoid air cells on the right. Visualized  mastoids elsewhere are clear. IMPRESSION: No intracranial mass, hemorrhage, or extra-axial fluid collection. Gray-white compartments appear normal. Age advanced atherosclerotic calcification in the carotid siphon and distal vertebral arteries bilaterally. Areas of paranasal sinus disease. Mild inferior right mastoid air cell disease. Electronically Signed   By: Lowella Grip III M.D.   On: 12/29/2016 16:53   Dg Chest Port 1 View  Result Date: 12/29/2016 CLINICAL DATA:  Emesis and nausea EXAM: PORTABLE CHEST 1 VIEW COMPARISON:  July 03, 2015 FINDINGS: Lungs are clear. Heart size and pulmonary vascularity are normal. No adenopathy. No bone lesions. IMPRESSION: No edema or consolidation. Electronically Signed   By: Lowella Grip III M.D.   On: 12/29/2016 14:35    ROS:  As stated above in the HPI otherwise negative.  Blood pressure 170/85, pulse (!) 28, temperature (!) 96.6 F (35.9 C), temperature source Rectal, resp. rate 19, SpO2 94 %.    PE: Gen: NAD, Alert and Oriented HEENT:  [place]/AT, EOMI Neck: Supple, no LAD Lungs: CTA Bilaterally CV: RRR without M/G/R ABM: Soft, NTND, +BS Ext: No C/C/E  Assessment/Plan: 1) Hematemesis. 2) Polycythemia. 3) S/p renal and pancreatic transplant. 4) Gastroparesis.   The patient may have a Mallory-Weiss tear precipitated by his gastroparesis.  I will perform an EGD for further evaluation.  He has an issue with polycythemia and he hypertensive.  No need for an emergent EGD at this time.    Plan: 1) EGD tomorrow. Minnetta Sandora D 12/29/2016, 5:51 PM

## 2016-12-29 NOTE — ED Notes (Signed)
Per admitting MD, titrate pt off of cardene. Then use PRN BP orders for goal of 180 SBP.

## 2016-12-29 NOTE — ED Triage Notes (Addendum)
Pt presents with c/o emesis. The emesis began this morning. He reports several episodes of bloody vomit. He c/o lightheadedness, nausea and not feeling well. Unable to obtain blood pressure reading in triage, pt actively vomiting blood. Pt taken immediately to room

## 2016-12-29 NOTE — ED Notes (Signed)
Admitting at bedside 

## 2016-12-29 NOTE — ED Notes (Signed)
Pt c/o nausea, also wants sprite.  Pt informed nausea medicine would be given priority and given warm blanket and emesis bag.

## 2016-12-29 NOTE — ED Provider Notes (Signed)
[facility] DEPT Provider Note   CSN: [id number] Arrival date & time: 12/29/16  1324     History   Chief Complaint Chief Complaint  Patient presents with  . Emesis    HPI [name] is a 41 y.o. male.  41 year old male with an extensive past medical history including type 1 diabetes,  ESRD status post pancreatic and renal transplant on immunosuppressants.    The history is provided by the patient.  Emesis   This is a new problem. The current episode started 3 to 5 hours ago. The problem occurs 5 to 10 times per day. The problem has not changed since onset.The emesis has an appearance of bright red blood and stomach contents. There has been no fever. Associated symptoms include abdominal pain (soreness that started after emesis). Pertinent negatives include no chills, no cough, no diarrhea, no fever, no headaches, no sweats and no URI.   Denies h/o PUD or esophageal varices.  Past Medical History:  Diagnosis Date  . 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 Arthritis    "I think I do; just in my fingers & my hands"  . Blood transfusion   . Chronic pain   . Depression   . Dialysis patient [facility]) 04/18/12   "[facility]; Tues, [place], [place]"  . Edema   . ESRD (end stage renal disease) on dialysis ([place])   . Gastroparesis   . Gastropathy   . GERD (gastroesophageal reflux disease)   . Headache(784.0)   . Hypertension   . Type I diabetes mellitus ([place])    "juvenile"    Patient Active Problem List   Diagnosis Date Noted  . Healthcare maintenance 08/26/2016  . Coffee ground emesis 12/17/2015  . Gastroparesis 12/16/2015  . Back pain 12/15/2015  . Non-intractable vomiting with nausea 07/04/2015  . Nausea & vomiting 07/03/2015  . History of diabetes mellitus 02/20/2015  . Abdominal pain 09/10/2014  . Intractable nausea and vomiting 09/09/2014  . Stage II pressure ulcer 07/29/2014  . Weakness of left upper extremity 07/03/2014  . Impaired mobility  and activities of daily living 07/03/2014  . Sternoclavicular joint pain 03/04/2014  . History of transplantation, renal 07/19/2013  . History of pancreas transplant ([place]) 07/19/2013  . Immunosuppressed status ([place]) 06/27/2013  . H/O pancreas transplant ([place]) 06/27/2013  . Diabetic gastroparesis associated with type 1 diabetes mellitus ([place]) 05/08/2013  . BKA stump complication ([place]) 60/63/0160  . S/P bilateral BKA (below knee amputation) ([place]) 08/17/2012  . Diabetic gastroparesis ([place]) 07/20/2011  . Metabolic bone disease 10/93/2355  . ESRD (end stage renal disease) ([place]) 11/17/2009  . GERD 03/14/2007  . HLD (hyperlipidemia) 02/22/2007  . Major depressive disorder, recurrent episode ([place]) 02/22/2007  . POST TRAUMATIC STRESS DISORDER 02/22/2007  . HYPERTENSION, BENIGN SYSTEMIC 02/22/2007  . 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 AV FISTULA PLACEMENT  08/2011   left upper arm  . BELOW KNEE LEG AMPUTATION  "it's been awhile"   bilaterally  . CATARACT EXTRACTION  ~ 2011   right  . COMBINED KIDNEY-PANCREAS TRANSPLANT         Home Medications    Prior to Admission medications   Medication Sig Start Date End Date Taking? Authorizing Provider  aspirin EC 81 MG tablet Take 81 mg by mouth daily.   Yes Historical Provider, MD  losartan (COZAAR) 50 MG tablet Take 50 mg by mouth daily.  09/08/16  Yes Historical Provider, MD  metoCLOPramide (REGLAN) 10 MG tablet Take 1 tablet (10 mg total) by mouth every 6 (six) hours as needed for nausea. 12/17/15  Yes Malaga N Rumley, DO  mycophenolate (MYFORTIC) 180 MG EC tablet Take 360 mg by mouth 2 (two) times daily.  07/06/13  Yes Historical Provider, MD  omeprazole (PRILOSEC) 20 MG capsule Take 1 capsule (20 mg total) by mouth daily as needed (heartburn). 06/06/14  Yes Linton Flemings, MD  ondansetron (ZOFRAN ODT) 4 MG disintegrating tablet Take 1 tablet (4 mg total) by mouth every 8 (eight) hours as needed  for nausea or vomiting. 01/08/15  Yes Gareth Morgan, MD  Oxycodone HCl 10 MG TABS Take 10 mg by mouth every 6 (six) hours as needed (pain).  09/28/16  Yes Historical Provider, MD  predniSONE (DELTASONE) 5 MG tablet Take 5 mg by mouth daily.  06/20/13  Yes Historical Provider, MD  promethazine (PHENERGAN) 25 MG tablet Take 25 mg by mouth every 6 (six) hours as needed for nausea or vomiting.   Yes Historical Provider, MD  ranitidine (ZANTAC) 150 MG tablet Take 1 tablet (150 mg total) by mouth 2 (two) times daily. 10/12/16  Yes Orpah Greek, MD  sucralfate (CARAFATE) 1 GM/10ML suspension Take 10 mLs (1 g total) by mouth 4 (four) times daily -  with meals and at bedtime. 10/12/16  Yes Orpah Greek, MD  sulfamethoxazole-trimethoprim (BACTRIM,SEPTRA) 400-80 MG per tablet Take 1 tablet by mouth every Monday, Wednesday, and Friday.    Yes Historical Provider, MD  tacrolimus (PROGRAF) 1 MG capsule Take 2-3 mg by mouth 2 (two) times daily. 3 mg in the morning, 2 mg in the evening 07/16/13  Yes Historical Provider, MD  cyclobenzaprine (FLEXERIL) 10 MG tablet Take 1 tablet (10 mg total) by mouth 2 (two) times daily as needed for muscle spasms. Patient not taking: Reported on 12/29/2016 01/08/15   Gareth Morgan, MD  polyethylene glycol (MIRALAX / GLYCOLAX) packet Take 17 g by mouth daily as needed for mild constipation. Patient not taking: Reported on 12/29/2016 12/17/15   Lorna Few, DO    Family History Family History  Problem Relation Age of Onset  . Diabetes Other   . Hypertension Other     Social History Social History  Substance Use Topics  . Smoking status: Never Smoker  . Smokeless tobacco: Never Used  . Alcohol use No     Allergies   Patient has no known allergies.   Review of Systems Review of Systems  Constitutional: Negative for chills and fever.  Respiratory: Negative for cough.   Gastrointestinal: Positive for abdominal pain (soreness that started after  emesis) and vomiting. Negative for diarrhea.  Neurological: Negative for headaches.  Ten systems are reviewed and are negative for acute change except as noted in the HPI    Physical Exam Updated Vital Signs BP (!) 256/113   Pulse 115   Temp (!) 96.6 F (35.9 C) (Rectal)   SpO2 100%   Physical Exam  Constitutional: He is oriented to person, place, and time. He appears well-developed and well-nourished. He appears ill. No distress.  In obvious discomfort. With active maroon hematemesis.  HENT:  Head: Normocephalic and atraumatic.  Nose: Nose normal.  Eyes: Conjunctivae and EOM are normal. Pupils are equal, round, and reactive to light. Right eye exhibits no discharge. Left eye exhibits no discharge. No scleral icterus.  Neck: Normal range of motion. Neck supple.  Cardiovascular: Regular rhythm.  Tachycardia present.  Exam reveals no gallop and  no friction rub.   No murmur heard. Pulmonary/Chest: Effort normal and breath sounds normal. No stridor. No respiratory distress. He has no rales.  Abdominal: Soft. He exhibits no distension. There is tenderness (discomfort) in the epigastric area. There is no rigidity, no rebound, no guarding and no CVA tenderness.  Musculoskeletal: He exhibits no edema or tenderness.  BLE BKA  Neurological: He is alert and oriented to person, place, and time.  Skin: Skin is warm and dry. No rash noted. He is not diaphoretic. No erythema.  Psychiatric: He has a normal mood and affect.  Vitals reviewed.    ED Treatments / Results  Labs (all labs ordered are listed, but only abnormal results are displayed) Labs Reviewed  CBC - Abnormal; Notable for the following:       Result Value   RBC 5.91 (*)    Hemoglobin 18.5 (*)    HCT 54.1 (*)    RDW 18.8 (*)    All other components within normal limits  COMPREHENSIVE METABOLIC PANEL - Abnormal; Notable for the following:    Potassium 5.2 (*)    Creatinine, Ser 1.58 (*)    GFR calc non Af Amer 52 (*)     All other components within normal limits  AMYLASE - Abnormal; Notable for the following:    Amylase 186 (*)    All other components within normal limits  I-STAT CHEM 8, ED - Abnormal; Notable for the following:    BUN 23 (*)    Creatinine, Ser 1.50 (*)    Glucose, Bld 102 (*)    Calcium, Ion 1.12 (*)    Hemoglobin 21.1 (*)    HCT 62.0 (*)    All other components within normal limits  LACTIC ACID, PLASMA  PROTIME-INR  LIPASE, BLOOD  BLOOD GAS, VENOUS  I-STAT CG4 LACTIC ACID, ED  I-STAT VENOUS BLOOD GAS, ED  POC OCCULT BLOOD, ED  I-STAT CG4 LACTIC ACID, ED  TYPE AND SCREEN    EKG  EKG Interpretation None       Radiology Dg Chest Port 1 View  Result Date: 12/29/2016 CLINICAL DATA:  Emesis and nausea EXAM: PORTABLE CHEST 1 VIEW COMPARISON:  July 03, 2015 FINDINGS: Lungs are clear. Heart size and pulmonary vascularity are normal. No adenopathy. No bone lesions. IMPRESSION: No edema or consolidation. Electronically Signed   By: Lowella Grip III M.D.   On: 12/29/2016 14:35    Procedures Procedures (including critical care time) CRITICAL CARE Performed by: Grayce Sessions Cardama Total critical care time: 40 minutes Critical care time was exclusive of separately billable procedures and treating other patients. Critical care was necessary to treat or prevent imminent or life-threatening deterioration. Critical care was time spent personally by me on the following activities: development of treatment plan with patient and/or surrogate as well as nursing, discussions with consultants, evaluation of patient's response to treatment, examination of patient, obtaining history from patient or surrogate, ordering and performing treatments and interventions, ordering and review of laboratory studies, ordering and review of radiographic studies, pulse oximetry and re-evaluation of patient's condition.   Medications Ordered in ED Medications  ondansetron (ZOFRAN) 4 MG/2ML injection (not  administered)  ondansetron (ZOFRAN) injection 4 mg (4 mg Intravenous Given 12/29/16 1413)  sodium chloride 0.9 % bolus 1,000 mL (1,000 mLs Intravenous New Bag/Given 12/29/16 1422)  metoCLOPramide (REGLAN) injection 10 mg (10 mg Intravenous Given 12/29/16 1436)  pantoprazole (PROTONIX) injection 40 mg (40 mg Intravenous Given 12/29/16 1436)     Initial Impression /  Assessment and Plan / ED Course  I have reviewed the triage vital signs and the nursing notes.  Pertinent labs & imaging results that were available during my care of the patient were reviewed by me and considered in my medical decision making (see chart for details).  Clinical Course     1. Hematemesis Concerning for hemorrhagic gastritis vs PUD from prednisone use. He was given multiple doses of nausea medicine which seemed to improve his symptoms. He was given protonix and IV fluids. Chest x-ray without evidence of pneumomediastinum suggesting esophageal perforation. Patient still having intermittent coffee-ground hematemesis. Still able to protect his airway.  2. Hypertension Patient complaining of mild headache thus we'll obtain CT head to rule out any intracranial bleed. Patient placed on a Cardene gtt for hypertensive emergency to see if blood pressure control improved patient's nausea.   Admitted to family medicine service for continued workup and management.  Final Clinical Impressions(s) / ED Diagnoses   Final diagnoses:  Hematemesis with nausea  Hypertensive emergency  Acute nonintractable headache, unspecified headache type      Fatima Blank, MD 12/29/16 [phone]

## 2016-12-29 NOTE — H&P (Signed)
[facility] Admission History and Physical Service Pager: [phone]  Patient name: [name] Medical record number: [id number] Date of birth: 17-Nov-1976 Age: 41 y.o. Gender: male  Primary Care Provider: Eloise Levels, MD Consultants: Gastroenterology  Code Status: FULL  Chief Complaint: vomiting blood   Assessment and Plan: [name] is a 41 y.o. male presenting with hematemesis. PMH is significant for T1DM with gastroparesis and s/p bilateral BKA, ESRD s/p combined kidney-pancreas transplant in 2014 on immunosuppressants, GERD, HTN.  Hypertensive urgency H/o HTN on losartan at home presented with hematemesis and found to have BP 256/113 in the ED. Started with HA last night and then nausea and vomiting this morning without vision changes. Concern for acute stroke but CT head showed no mass, hemorrhage or extra-axial fluid collection. No concern for ACS or aortic dissection since patient does not have chest pain. Started on nicardipine gtt in the ED with goal SBP 180, on admit BP was 188/101. Was still symptomatic on admit with frontal HA but no vision changes or focal weakness.  Blood pressure should not be lowered more than 25% for the first few hours.  - Admit to SDU, attending Dr. Gwendlyn Deutscher  - Discontinue nicardipine gtt since BP in goal range. If BP cannot be controlled off nicardipine gtt will need to be admitted to ICU, CCM aware. - Start IV hydralazine 10mg  scheduled QID with 10mg  q4prn, second line for BP control is  IV labetalol 10mg  q2prn for SBP>190 or DBP>120 - Continue home losartan 50mg  daily - Monitor BP closely - Monitor on telemetry  Hematemesis with intractable nausea and vomiting on presentation, now with coffee ground emesis and midepigastric abdominal pain. Presented with c/o vomiting bright red blood that occurred this morning after retching c/w upper GI bleed. Differential includes gastritis since patient is chronically on  steroids for immunosuppression vs Mallory Weiss tear especially since hematemesis only began after retching. Hgb on admit 18.5 which is at his baseline. Type and screen done in the ED. No h/o ulcers or varices. Hematemesis is not excessive and had decreased in frequency since initial presentation - IV zofran PRN, IV phenergan PRN, home po reglan PRN for nausea - IV protonix 40mg  qd - clear liquid diet, NPO at midnight - NS @ 150cc/hr - GI consult, appreciate recommendations. Will likely do EGD late tomorrow 12/29/16 - monitor hemoglobin, repeat CBC in am   H/o T1DM and ESRD s/p combined kidney-pancreas transplant Patient has a complex medical history, T1DM and ESRD now well controlled and stable after a combined kidney-pancreas transplant in 2014 on immunosuppressants. Also has h/o gastroparesis and bilateral BKA from his diabetes. Followed by [facility]. - Continue home immunosuppressive medications: mycophenalate, tacrolimus, prednisone and prophylactic bactrim - Closely watch Cr.  - Discuss any significant decompensation in kidney function w/ transplant Dr.  Jerrye Bushy on omeprazole 20mg  daily at home - hold home omeprazole since on IV PPI - IV PPI Qdaily per GI   FEN/GI: clear liquid diet, NPO at midnight. IV protonix Prophylaxis: no VTE ppx d/t active bleed and no SCDs since s/p bilateral BKAs.  Disposition: pending medical improvement  History of Present Illness:  [name] is a 41 y.o. male presenting with vomiting blood.   States had a frontal HA last night that was mild and feeling weak before bed. Woke up this morning with nausea and had retching for few bouts before started vomiting bright red blood around 10am. States had occurred more times can  count today and per ED nurse when patient was brought back from triage had 200-400cc of bright red blood from vomiting that on admit had become more red tinged sputum with some emesis appearing coffee ground. Had previous  episode a year ago that was he was admitted and observed without intervention.   Review Of Systems: Per HPI with the following additions:  Review of Systems  Constitutional: Positive for chills and fever.  Eyes: Negative for blurred vision and double vision.  Respiratory: Negative for shortness of breath and wheezing.   Cardiovascular: Negative for chest pain and palpitations.  Gastrointestinal: Positive for abdominal pain, heartburn, nausea and vomiting. Negative for constipation and diarrhea.  Genitourinary: Negative for dysuria, frequency and urgency.  Skin: Negative for rash.  Neurological: Positive for weakness and headaches. Negative for dizziness and focal weakness.    Patient Active Problem List   Diagnosis Date Noted  . Healthcare maintenance 08/26/2016  . Coffee ground emesis 12/17/2015  . Gastroparesis 12/16/2015  . Back pain 12/15/2015  . Non-intractable vomiting with nausea 07/04/2015  . Nausea & vomiting 07/03/2015  . History of diabetes mellitus 02/20/2015  . Abdominal pain 09/10/2014  . Intractable nausea and vomiting 09/09/2014  . Stage II pressure ulcer 07/29/2014  . Weakness of left upper extremity 07/03/2014  . Impaired mobility and activities of daily living 07/03/2014  . Sternoclavicular joint pain 03/04/2014  . History of transplantation, renal 07/19/2013  . History of pancreas transplant ([place]) 07/19/2013  . Immunosuppressed status ([place]) 06/27/2013  . H/O pancreas transplant ([place]) 06/27/2013  . Diabetic gastroparesis associated with type 1 diabetes mellitus ([place]) 05/08/2013  . BKA stump complication ([place]) 03/50/0938  . S/P bilateral BKA (below knee amputation) ([place]) 08/17/2012  . Diabetic gastroparesis ([place]) 07/20/2011  . Metabolic bone disease 18/29/9371  . ESRD (end stage renal disease) ([place]) 11/17/2009  . GERD 03/14/2007  . HLD (hyperlipidemia) 02/22/2007  . Major depressive disorder, recurrent episode ([place]) 02/22/2007  . POST TRAUMATIC STRESS  DISORDER 02/22/2007  . HYPERTENSION, BENIGN SYSTEMIC 02/22/2007  . 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 Arthritis    "I think I do; just in my fingers & my hands"  . Blood transfusion   . Chronic pain   . Depression   . Dialysis patient [facility]) 04/18/12   "[facility]; Tues, [place], [place]"  . Edema   . ESRD (end stage renal disease) on dialysis ([place])   . Gastroparesis   . Gastropathy   . GERD (gastroesophageal reflux disease)   . Headache(784.0)   . Hypertension   . Type I diabetes mellitus ([place])    "juvenile"    Past Surgical History: Past Surgical History:  Procedure Laterality Date  . AV FISTULA PLACEMENT  08/2011   left upper arm  . BELOW KNEE LEG AMPUTATION  "it's been awhile"   bilaterally  . CATARACT EXTRACTION  ~ 2011   right  . COMBINED KIDNEY-PANCREAS TRANSPLANT      Social History: Social History  Substance Use Topics  . Smoking status: Never Smoker  . Smokeless tobacco: Never Used  . Alcohol use No   Additional social history: Denies current EtOH or recreational use.  Please also refer to relevant sections of EMR.  Family History: Family History  Problem Relation Age of Onset  . Diabetes Other   . Hypertension Other     Allergies and Medications: No Known Allergies  No current facility-administered medications on file prior to encounter.    Current Outpatient Prescriptions on File Prior to Encounter  Medication Sig Dispense Refill  . aspirin EC 81 MG tablet Take 81 mg by mouth daily.    [name] losartan (COZAAR) 50 MG tablet Take 50 mg by mouth daily.     . metoCLOPramide (REGLAN) 10 MG tablet Take 1 tablet (10 mg total) by mouth every 6 (six) hours as needed for nausea. 30 tablet 0  . mycophenolate (MYFORTIC) 180 MG EC tablet Take 360 mg by mouth 2 (two) times daily.     [name] omeprazole (PRILOSEC) 20 MG capsule Take 1 capsule (20 mg total)  by mouth daily as needed (heartburn). 30 capsule 0  . ondansetron (ZOFRAN ODT) 4 MG disintegrating tablet Take 1 tablet (4 mg total) by mouth every 8 (eight) hours as needed for nausea or vomiting. 20 tablet 0  . Oxycodone HCl 10 MG TABS Take 10 mg by mouth every 6 (six) hours as needed (pain).     . predniSONE (DELTASONE) 5 MG tablet Take 5 mg by mouth daily.     . promethazine (PHENERGAN) 25 MG tablet Take 25 mg by mouth every 6 (six) hours as needed for nausea or vomiting.    . ranitidine (ZANTAC) 150 MG tablet Take 1 tablet (150 mg total) by mouth 2 (two) times daily. 60 tablet 0  . sucralfate (CARAFATE) 1 GM/10ML suspension Take 10 mLs (1 g total) by mouth 4 (four) times daily -  with meals and at bedtime. 420 mL 0  . sulfamethoxazole-trimethoprim (BACTRIM,SEPTRA) 400-80 MG per tablet Take 1 tablet by mouth every Monday, Wednesday, and Friday.     . tacrolimus (PROGRAF) 1 MG capsule Take 2-3 mg by mouth 2 (two) times daily. 3 mg in the morning, 2 mg in the evening    . cyclobenzaprine (FLEXERIL) 10 MG tablet Take 1 tablet (10 mg total) by mouth 2 (two) times daily as needed for muscle spasms. (Patient not taking: Reported on 12/29/2016) 10 tablet 0  . polyethylene glycol (MIRALAX / GLYCOLAX) packet Take 17 g by mouth daily as needed for mild constipation. (Patient not taking: Reported on 12/29/2016) 14 each 0    Objective: BP (!) 191/104   Pulse 95   Temp (!) 96.6 F (35.9 C) (Rectal)   Resp 17   SpO2 99%  Exam: General: Lying in bed, appears in mild discomfort but no distress Eyes: PERRL, EOMI, conjunctiva clear.  ENTM: [place], AT. Moist mucous membranes  Neck: normal ROM Cardiovascular: tachycardic, regular rhythm, normal S1 and S2. No murmurs. No JVD Respiratory: CTAB, normal effort on room air Gastrointestinal: soft, tender to palpation in mid epigastric area, nondistended, + bs MSK: bilateral BKA. No edema. Derm: warm and dry  Neuro: no focal deficits. Strength 5/5 throughout and  sensation intact throughout Psych: normal affect   Labs and Imaging: CBC BMET   Recent Labs Lab 12/29/16 1358 12/29/16 1551  WBC 7.7  --   HGB 18.5* 21.1*  HCT 54.1* 62.0*  PLT PLATELET CLUMPS NOTED ON SMEAR, COUNT APPEARS ADEQUATE  --     Recent Labs Lab 12/29/16 1518 12/29/16 1551  NA 141 143  K 5.2* 4.1  CL 107 107  CO2 24  --   BUN 18 23*  CREATININE 1.58* 1.50*  GLUCOSE 95 102*  CALCIUM 9.1  --      Lactic acid 1.6   Ct Head Wo Contrast  Result Date: 12/29/2016 CLINICAL DATA:  Headache and emesis EXAM: CT HEAD WITHOUT CONTRAST TECHNIQUE: Contiguous axial images were obtained from the base of the skull through the vertex without intravenous contrast. COMPARISON:  January 08, 2015 FINDINGS: Brain: The ventricles are normal in size and configuration. There is a small cavum septum pellucidum, an anatomic variant. There is no intracranial mass, hemorrhage, extra-axial fluid collection, or midline shift. Gray-white compartments appear normal. No acute infarct evident. Vascular: There is no hyperdense vessel. There are foci of calcification in distal vertebral arteries bilaterally as well as in the carotid siphon regions bilaterally. Skull: Bony calvarium appears intact. Sinuses/Orbits: There is slight mucosal thickening in each maxillary antrum. There is opacification in several ethmoid air cells bilaterally. Other paranasal sinuses are clear. Orbits appear symmetric bilaterally. Other: There is opacification in several inferior mastoid air cells on the right. Visualized mastoids elsewhere are clear. IMPRESSION: No intracranial mass, hemorrhage, or extra-axial fluid collection. Gray-white compartments appear normal. Age advanced atherosclerotic calcification in the carotid siphon and distal vertebral arteries bilaterally. Areas of paranasal sinus disease. Mild inferior right mastoid air cell disease. Electronically Signed   By: Lowella Grip III M.D.   On: 12/29/2016 16:53   Dg  Chest Port 1 View  Result Date: 12/29/2016 CLINICAL DATA:  Emesis and nausea EXAM: PORTABLE CHEST 1 VIEW COMPARISON:  July 03, 2015 FINDINGS: Lungs are clear. Heart size and pulmonary vascularity are normal. No adenopathy. No bone lesions. IMPRESSION: No edema or consolidation. Electronically Signed   By: Lowella Grip III M.D.   On: 12/29/2016 14:35    Bufford Lope, DO 12/29/2016, 5:13 PM PGY-1, [place] [facility] pager: [phone], text pages welcome   Upper Level Addendum:  I have seen and evaluated this patient along with Dr. Shawna Orleans and reviewed the above note, making necessary revisions in red.   Elberta Leatherwood, MD,MS,  PGY3 12/29/2016 6:47 PM

## 2016-12-29 NOTE — ED Notes (Signed)
EKG given to Dr. Thomasene Lot

## 2016-12-29 NOTE — ED Notes (Signed)
Pt's temp 96.6 and BP 256/113.  Informed Dr. Leonette Monarch.

## 2016-12-29 NOTE — ED Notes (Signed)
Pt curled up under blanket.  When asked, he states he is doing "Well" and does not need anything at this time.

## 2016-12-30 ENCOUNTER — Encounter (HOSPITAL_COMMUNITY): Payer: Self-pay | Admitting: *Deleted

## 2016-12-30 ENCOUNTER — Encounter (HOSPITAL_COMMUNITY)
Admission: EM | Disposition: A | Discharge status: Home / Self Care | Payer: Self-pay | Source: Home / Self Care | Attending: Family Medicine

## 2016-12-30 ENCOUNTER — Inpatient Hospital Stay (HOSPITAL_COMMUNITY): Payer: Medicare Other

## 2016-12-30 DIAGNOSIS — Z940 Kidney transplant status: Secondary | ICD-10-CM

## 2016-12-30 DIAGNOSIS — Z89512 Acquired absence of left leg below knee: Secondary | ICD-10-CM

## 2016-12-30 DIAGNOSIS — Z89511 Acquired absence of right leg below knee: Secondary | ICD-10-CM

## 2016-12-30 HISTORY — PX: ESOPHAGOGASTRODUODENOSCOPY: SHX5428

## 2016-12-30 LAB — BLOOD GAS, VENOUS
ACID-BASE DEFICIT: 0.6 mmol/L (ref 0.0–2.0)
BICARBONATE: 23.5 mmol/L (ref 20.0–28.0)
O2 Saturation: 91.4 %
PH VEN: 7.402 (ref 7.250–7.430)
PO2 VEN: 64.4 mmHg — AB (ref 32.0–45.0)
Patient temperature: 98.6
pCO2, Ven: 38.6 mmHg — ABNORMAL LOW (ref 44.0–60.0)

## 2016-12-30 LAB — HEMOGLOBIN AND HEMATOCRIT, BLOOD
HCT: 52.7 % — ABNORMAL HIGH (ref 39.0–52.0)
Hemoglobin: 17.7 g/dL — ABNORMAL HIGH (ref 13.0–17.0)

## 2016-12-30 LAB — PROTIME-INR
INR: 1.02
Prothrombin Time: 13.5 seconds (ref 11.4–15.2)

## 2016-12-30 LAB — COMPREHENSIVE METABOLIC PANEL
ALK PHOS: 77 U/L (ref 38–126)
ALT: 15 U/L — AB (ref 17–63)
AST: 19 U/L (ref 15–41)
Albumin: 3.8 g/dL (ref 3.5–5.0)
Anion gap: 12 (ref 5–15)
BUN: 14 mg/dL (ref 6–20)
CHLORIDE: 106 mmol/L (ref 101–111)
CO2: 21 mmol/L — AB (ref 22–32)
CREATININE: 1.56 mg/dL — AB (ref 0.61–1.24)
Calcium: 9.2 mg/dL (ref 8.9–10.3)
GFR calc Af Amer: >60 mL/min (ref >60)
GFR, EST NON AFRICAN AMERICAN: 53 mL/min — AB (ref >60)
Glucose, Bld: 115 mg/dL — ABNORMAL HIGH (ref 65–99)
Potassium: 3.6 mmol/L (ref 3.5–5.1)
SODIUM: 139 mmol/L (ref 135–145)
Total Bilirubin: 0.7 mg/dL (ref 0.3–1.2)
Total Protein: 7.2 g/dL (ref 6.5–8.1)

## 2016-12-30 LAB — CBC
HCT: 52.3 % — ABNORMAL HIGH (ref 39.0–52.0)
HEMOGLOBIN: 17.7 g/dL — AB (ref 13.0–17.0)
MCH: 31.3 pg (ref 26.0–34.0)
MCHC: 33.8 g/dL (ref 30.0–36.0)
MCV: 92.6 fL (ref 78.0–100.0)
Platelets: 200 10*3/uL (ref 150–400)
RBC: 5.65 MIL/uL (ref 4.22–5.81)
RDW: 15.2 % (ref 11.5–15.5)
WBC: 8.9 10*3/uL (ref 4.0–10.5)

## 2016-12-30 LAB — MRSA PCR SCREENING: MRSA BY PCR: NEGATIVE

## 2016-12-30 LAB — INFLUENZA PANEL BY PCR (TYPE A & B)
INFLAPCR: NEGATIVE
INFLBPCR: NEGATIVE

## 2016-12-30 LAB — GLUCOSE, CAPILLARY: GLUCOSE-CAPILLARY: 111 mg/dL — AB (ref 65–99)

## 2016-12-30 SURGERY — EGD (ESOPHAGOGASTRODUODENOSCOPY)
Anesthesia: Moderate Sedation

## 2016-12-30 MED ORDER — FENTANYL CITRATE (PF) 100 MCG/2ML IJ SOLN
INTRAMUSCULAR | Status: DC | PRN
  Administered 2016-12-30 (×2): 25 ug via INTRAVENOUS

## 2016-12-30 MED ORDER — TACROLIMUS 1 MG PO CAPS: 2.0000 mg | ORAL_CAPSULE | Freq: Two times a day (BID) | ORAL | Status: DC

## 2016-12-30 MED ORDER — TACROLIMUS 1 MG PO CAPS
2.0000 mg | ORAL_CAPSULE | Freq: Every day | ORAL | Status: DC
  Administered 2016-12-30 – 2017-01-01 (×3): 2 mg via ORAL
  Filled 2016-12-30 (×4): qty 2

## 2016-12-30 MED ORDER — LABETALOL HCL 5 MG/ML IV SOLN
10.0000 mg | INTRAVENOUS | Status: DC | PRN
  Administered 2016-12-30: 10 mg via INTRAVENOUS
  Filled 2016-12-30: qty 4

## 2016-12-30 MED ORDER — MIDAZOLAM HCL 10 MG/2ML IJ SOLN
INTRAMUSCULAR | Status: DC | PRN
  Administered 2016-12-30: 1 mg via INTRAVENOUS
  Administered 2016-12-30: 2 mg via INTRAVENOUS
  Administered 2016-12-30 (×4): 1 mg via INTRAVENOUS

## 2016-12-30 MED ORDER — LOSARTAN POTASSIUM 50 MG PO TABS
50.0000 mg | ORAL_TABLET | Freq: Every day | ORAL | Status: DC
  Administered 2016-12-31 – 2017-01-01 (×2): 50 mg via ORAL
  Filled 2016-12-30 (×2): qty 1

## 2016-12-30 MED ORDER — SODIUM CHLORIDE 0.9 % IV SOLN
INTRAVENOUS | Status: DC
  Administered 2016-12-30 – 2017-01-01 (×5): via INTRAVENOUS

## 2016-12-30 MED ORDER — PREDNISONE 10 MG PO TABS
5.0000 mg | ORAL_TABLET | Freq: Every day | ORAL | Status: DC
  Administered 2016-12-31 – 2017-01-02 (×3): 5 mg via ORAL
  Filled 2016-12-30 (×3): qty 1

## 2016-12-30 MED ORDER — TACROLIMUS 1 MG PO CAPS
3.0000 mg | ORAL_CAPSULE | Freq: Every day | ORAL | Status: DC
  Administered 2016-12-31 – 2017-01-02 (×3): 3 mg via ORAL
  Filled 2016-12-30 (×4): qty 3

## 2016-12-30 MED ORDER — SULFAMETHOXAZOLE-TRIMETHOPRIM 400-80 MG PO TABS
1.0000 | ORAL_TABLET | ORAL | Status: DC
  Administered 2017-01-02: 1 via ORAL
  Filled 2016-12-30 (×2): qty 1

## 2016-12-30 MED ORDER — PANTOPRAZOLE SODIUM 40 MG IV SOLR
40.0000 mg | INTRAVENOUS | Status: DC
  Administered 2016-12-30: 40 mg via INTRAVENOUS
  Filled 2016-12-30: qty 40

## 2016-12-30 MED ORDER — MIDAZOLAM HCL 5 MG/ML IJ SOLN
INTRAMUSCULAR | Status: AC
  Filled 2016-12-30: qty 2

## 2016-12-30 MED ORDER — PANTOPRAZOLE SODIUM 40 MG PO TBEC: 40.0000 mg | DELAYED_RELEASE_TABLET | Freq: Once | ORAL | Status: DC

## 2016-12-30 MED ORDER — MYCOPHENOLATE SODIUM 180 MG PO TBEC
360.0000 mg | DELAYED_RELEASE_TABLET | Freq: Two times a day (BID) | ORAL | Status: DC
  Administered 2016-12-30 – 2017-01-02 (×6): 360 mg via ORAL
  Filled 2016-12-30 (×8): qty 2

## 2016-12-30 MED ORDER — FENTANYL CITRATE (PF) 100 MCG/2ML IJ SOLN
INTRAMUSCULAR | Status: AC
  Filled 2016-12-30: qty 2

## 2016-12-30 MED ORDER — DIPHENHYDRAMINE HCL 50 MG/ML IJ SOLN
INTRAMUSCULAR | Status: AC
  Filled 2016-12-30: qty 1

## 2016-12-30 MED ORDER — SODIUM CHLORIDE 0.9% FLUSH
3.0000 mL | Freq: Two times a day (BID) | INTRAVENOUS | Status: DC
  Administered 2016-12-31 – 2017-01-02 (×4): 3 mL via INTRAVENOUS

## 2016-12-30 MED ORDER — PROMETHAZINE HCL 25 MG/ML IJ SOLN
25.0000 mg | Freq: Four times a day (QID) | INTRAMUSCULAR | Status: DC | PRN
  Administered 2016-12-30 – 2017-01-01 (×7): 25 mg via INTRAVENOUS
  Filled 2016-12-30 (×8): qty 1

## 2016-12-30 MED ORDER — PROMETHAZINE HCL 25 MG PO TABS: 25.0000 mg | ORAL_TABLET | Freq: Four times a day (QID) | ORAL | Status: DC | PRN

## 2016-12-30 MED ORDER — SODIUM CHLORIDE 0.9 % IV SOLN: INTRAVENOUS | Status: DC

## 2016-12-30 NOTE — Progress Notes (Signed)
Upon admission this charge RN noted that the pt does not have an assigned attending physician.  Called Patient Placement so that the appropriate attending could be added to the pts chart.  RN Tish aware of this issue.  Will continue to monitor.

## 2016-12-30 NOTE — Interval H&P Note (Signed)
History and Physical Interval Note:  12/30/2016 5:12 PM  [name]  has presented today for surgery, with the diagnosis of Hematemesis  The various methods of treatment have been discussed with the patient and family. After consideration of risks, benefits and other options for treatment, the patient has consented to  Procedure(s): ESOPHAGOGASTRODUODENOSCOPY (EGD) (N/A) as a surgical intervention .  The patient's history has been reviewed, patient examined, no change in status, stable for surgery.  I have reviewed the patient's chart and labs.  Questions were answered to the patient's satisfaction.     Kathreen Dileo D

## 2016-12-30 NOTE — ED Notes (Signed)
Attempted report x1.  Name and callback number provided.   

## 2016-12-30 NOTE — Op Note (Signed)
[facility] Patient Name: [name] Procedure Date : 12/30/2016 MRN: [id number] Attending MD: Carol Ada , MD Date of Birth: 24-Jul-1976 CSN: [id number] Age: 41 Admit Type: Inpatient Procedure:                Upper GI endoscopy Indications:              Hematemesis Providers:                Carol Ada, MD, Elna Breslow, RN, Cherylynn Ridges,                            Technician Referring MD:              Medicines:                Midazolam 7 mg IV, Fentanyl 50 micrograms IV Complications:            No immediate complications. Estimated Blood Loss:     Estimated blood loss: none. Procedure:                Pre-Anesthesia Assessment:                           - Prior to the procedure, a History and Physical                            was performed, and patient medications and                            allergies were reviewed. The patient's tolerance of                            previous anesthesia was also reviewed. The risks                            and benefits of the procedure and the sedation                            options and risks were discussed with the patient.                            All questions were answered, and informed consent                            was obtained. Prior Anticoagulants: The patient has                            taken no previous anticoagulant or antiplatelet                            agents. ASA Grade Assessment: III - A patient with                            severe systemic disease. After reviewing the risks  and benefits, the patient was deemed in                            satisfactory condition to undergo the procedure.                           - Sedation was administered by an endoscopy nurse.                            The sedation level attained was moderate.                           After obtaining informed consent, the endoscope was                            passed under direct  vision. Throughout the                            procedure, the patient's blood pressure, pulse, and                            oxygen saturations were monitored continuously. The                            [id number] ([id number]) scope was introduced through the                            mouth, and advanced to the second part of duodenum.                            The upper GI endoscopy was somewhat difficult due                            to the patient's discomfort during the procedure.                            Successful completion of the procedure was aided by                            increasing the dose of sedation medication. The                            patient tolerated the procedure. Scope In: Scope Out: Findings:      Patchy mild mucosal changes characterized by hemorrhagic appearance were       found in the lower third of the esophagus.      The stomach was normal.      The examined duodenum was normal.      Initial entry into the esophageal lumen was significant for blood. The       Z-line was not well-visualized as the patient's tolerance was poor. In       the distal esophagus the mucosa was abnormal in that blood oozed from       the mucosa. There was no clear evidence of AVMs and no  overt evidence of       inflammation. Washing cleared the blood and then with continued       observation, blood reaccumulated shortly. In the gastric lumen there was       a large amount of fluid and it was suctioned, but no abnormalities were       noted. Impression:               - Hemorrhagic appearing mucosa in the esophagus.                           - Normal stomach.                           - Normal examined duodenum.                           - No specimens collected. Moderate Sedation:      Moderate (conscious) sedation was administered by the endoscopy nurse       and supervised by the endoscopist. The following parameters were       monitored: oxygen saturation, heart  rate, blood pressure, and response       to care. Recommendation:           - Return patient to hospital ward for ongoing care.                           - Clear liquid diet.                           - Continue present medications.                           - Continue PPI.                           - Follow HGB. Procedure Code(s):        --- Professional ---                           [phone], Esophagogastroduodenoscopy, flexible,                            transoral; diagnostic, including collection of                            specimen(s) by brushing or washing, when performed                            (separate procedure) Diagnosis Code(s):        --- Professional ---                           K22.8, Other specified diseases of esophagus                           K92.0, Hematemesis CPT copyright 2016 [employer]. All rights reserved. The codes documented in this report are preliminary and upon coder review may  be revised to meet current  compliance requirements. Carol Ada, MD Carol Ada, MD 12/30/2016 5:23:12 PM This report has been signed electronically. Number of Addenda: 0

## 2016-12-30 NOTE — Progress Notes (Signed)
Family Medicine Teaching Service Daily Progress Note Intern Pager: [phone]  Patient name: [name] Medical record number: [id number] Date of birth: 1976/08/05 Age: 41 y.o. Gender: male  Primary Care Provider: Eloise Levels, MD Consultants: Gastroenterology  Code Status: FULL  Pt Overview and Major Events to Date:  12/29/16 admitted for hypertensive urgency  Assessment and Plan: [name] is a 41 y.o. male presenting with hematemesis. PMH is significant for T1DM with gastroparesis and s/p bilateral BKA, ESRD s/p combined kidney-pancreas transplant in 2014 on immunosuppressants, GERD, HTN.  Hypertensive urgency H/o HTN on losartan at home presented with hematemesis and found to have BP 256/113 in the ED. Started with HA night before admit and then nausea and vomiting morning of admit without vision changes. Concern for acute stroke but CT head showed no mass, hemorrhage or extra-axial fluid collection. No concern for ACS or aortic dissection since patient does not have chest pain. Was still symptomatic on admit with frontal HA but no vision changes or focal weakness; now asymptomatic. Started on nicardipine gtt in the ED with goal SBP 180, on admit BP was 188/101 was transitioned off nicardipine gtt to IV hydralazine and labetalol. BP should not be decreased more than 25% from max for the first 24 hours. BP improved to 125/76 overnight but has increased to 208/118 this morning and now at 189/106 after scheduled IV hydralazine. - continue IV hydralazine 10mg  scheduled q6 with IV labetalol 10mg  q2prn for SBP>190 or DBP>120. Currently BP 181/109 this morning, at goal.  - hold home losartan while NPO - Monitor BP closely - Monitor on telemetry  Intractable nausea and vomiting with hematemesis on presentation vomiting bright red blood that occurred after retching c/w upper GI bleed. Differential includes gastritis since patient is chronically on steroids for immunosuppression vs  Mallory Weiss tear especially since hematemesis only began after retching. Hgb stable at 17.7, baseline ~18.  No h/o ulcers or varices. Hematemesis is not excessive and has resolved since admission. Difficult to ascertain if GI complaints precipitated HTN urgency or vice versa, but must consider additional possible precipitants of acute GI illness since on admit SIRS 2 although qSOFA 1.  - IV zofran PRN, IV phenergan PRN, home po reglan PRN for nausea - IV protonix 40mg  qd - NPO - NS @ 150cc/hr - Per GI, may have Mallory-Weiss tear, will likely do EGD late today 12/30/16 since nonemergent - monitor hemoglobin - KUB - influenza PCR - blood and urine cultures  H/o T1DM and ESRD s/p combined kidney-pancreas transplant Patient has a complex medical history, T1DM and ESRD now well controlled and stable after a combined kidney-pancreas transplant in 2014 on immunosuppressants. Last a1c 5.5 on 04/22/16. Also has h/o gastroparesis and bilateral BKA from his diabetes. Followed by [facility]. Post-transplant baseline creatinine appears to be ~1.6 - Continue home immunosuppressive medications: mycophenalate, tacrolimus, prednisone and prophylactic bactrim - Closely watch Cr, is currently at baseline. - Discuss any significant decompensation in kidney function w/ transplant Dr.  Jerrye Bushy on omeprazole 20mg  daily at home - hold home omeprazole since on IV PPI - IV PPI Qdaily per GI   FEN/GI: NPO, IV protonix Prophylaxis: no VTE ppx d/t active bleed and no SCDs since s/p bilateral BKAs.  Disposition: pending medical improvement  Subjective:  Was nauseous this am but feeling much improved after receiving IV zofran. Has had occasional dry heaving but no vomiting. Feels hungry now. States HA has resolved, no vision changes. No CP, palpitations, SOB.  Objective: Temp:  [96.6 F (35.9 C)-99.5 F (37.5 C)] 98.7 F (37.1 C) (01/05 0547) Pulse Rate:  [95-120] 113 (01/05 0547) Resp:   [12-31] 27 (01/05 0547) BP: (125-256)/(70-160) 208/118 (01/05 0627) SpO2:  [91 %-100 %] 98 % (01/05 0547) Weight:  [151 lb 7.3 oz (68.7 kg)] 151 lb 7.3 oz (68.7 kg) (01/05 0547) Physical Exam: General: lying in bed comfortably, in no distress HEENT: [place], AT. PERRL, EOMI. Cardiovascular: tachycardic, regular rhythm, normal S1 and S2. No murmurs. Respiratory: CTAB, normal effort on room air Abdomen: soft, nontender, nondistended, + bs Extremities: bilateral BKA. No edema. Neuro: no focal deficits  Laboratory:  Recent Labs Lab 12/29/16 1358 12/29/16 1551 12/30/16 0329  WBC 7.7  --  8.9  HGB 18.5* 21.1* 17.7*  HCT 54.1* 62.0* 52.3*  PLT PLATELET CLUMPS NOTED ON SMEAR, COUNT APPEARS ADEQUATE  --  200    Recent Labs Lab 12/29/16 1518 12/29/16 1551 12/30/16 0329  NA 141 143 139  K 5.2* 4.1 3.6  CL 107 107 106  CO2 24  --  21*  BUN 18 23* 14  CREATININE 1.58* 1.50* 1.56*  CALCIUM 9.1  --  9.2  PROT 7.1  --  7.2  BILITOT 0.8  --  0.7  ALKPHOS 83  --  77  ALT 18  --  15*  AST 28  --  19  GLUCOSE 95 102* 115*    Imaging/Diagnostic Tests: Ct Head Wo Contrast  Result Date: 12/29/2016 CLINICAL DATA:  Headache and emesis EXAM: CT HEAD WITHOUT CONTRAST TECHNIQUE: Contiguous axial images were obtained from the base of the skull through the vertex without intravenous contrast. COMPARISON:  January 08, 2015 FINDINGS: Brain: The ventricles are normal in size and configuration. There is a small cavum septum pellucidum, an anatomic variant. There is no intracranial mass, hemorrhage, extra-axial fluid collection, or midline shift. Gray-white compartments appear normal. No acute infarct evident. Vascular: There is no hyperdense vessel. There are foci of calcification in distal vertebral arteries bilaterally as well as in the carotid siphon regions bilaterally. Skull: Bony calvarium appears intact. Sinuses/Orbits: There is slight mucosal thickening in each maxillary antrum. There is  opacification in several ethmoid air cells bilaterally. Other paranasal sinuses are clear. Orbits appear symmetric bilaterally. Other: There is opacification in several inferior mastoid air cells on the right. Visualized mastoids elsewhere are clear. IMPRESSION: No intracranial mass, hemorrhage, or extra-axial fluid collection. Gray-white compartments appear normal. Age advanced atherosclerotic calcification in the carotid siphon and distal vertebral arteries bilaterally. Areas of paranasal sinus disease. Mild inferior right mastoid air cell disease. Electronically Signed   By: Lowella Grip III M.D.   On: 12/29/2016 16:53   Dg Chest Port 1 View  Result Date: 12/29/2016 CLINICAL DATA:  Emesis and nausea EXAM: PORTABLE CHEST 1 VIEW COMPARISON:  July 03, 2015 FINDINGS: Lungs are clear. Heart size and pulmonary vascularity are normal. No adenopathy. No bone lesions. IMPRESSION: No edema or consolidation. Electronically Signed   By: Lowella Grip III M.D.   On: 12/29/2016 14:35   Bufford Lope, DO 12/30/2016, 6:39 AM PGY-1, [place] Intern pager: [phone], text pages welcome

## 2016-12-30 NOTE — H&P (View-Only) (Signed)
Reason for Consult: Hematemesis Referring Physician: Family Medicine  [name] HPI: This is a 41 year old male s/p renal and pancreas transplant from [facility] for type 1 diabetes, gastroparesis, s/p bilateral BKA, GERD, and HTN admitted for complaints of hematemesis.  In the ER he had 400 ml of hematemesis, per the resident.  In the past he reports a semi-routine occurrence with hematemesis and he averages once per 6 months.  He has some issues with GERD and he has abdominal pain, but this is from his vomiting.  Since his transplant he does not require dialysis or any insulin.  Past Medical History:  Diagnosis Date  . 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 Arthritis    "I think I do; just in my fingers & my hands"  . Blood transfusion   . Chronic pain   . Depression   . Dialysis patient [facility]) 04/18/12   "[facility]; Tues, [place], [place]"  . Edema   . ESRD (end stage renal disease) on dialysis ([place])   . Gastroparesis   . Gastropathy   . GERD (gastroesophageal reflux disease)   . Headache(784.0)   . Hypertension   . Type I diabetes mellitus ([place])    "juvenile"    Past Surgical History:  Procedure Laterality Date  . AV FISTULA PLACEMENT  08/2011   left upper arm  . BELOW KNEE LEG AMPUTATION  "it's been awhile"   bilaterally  . CATARACT EXTRACTION  ~ 2011   right  . COMBINED KIDNEY-PANCREAS TRANSPLANT      Family History  Problem Relation Age of Onset  . Diabetes Other   . Hypertension Other     Social History:  reports that he has never smoked. He has never used smokeless tobacco. He reports that he does not drink alcohol or use drugs.  Allergies: No Known Allergies  Medications:  Scheduled: . ondansetron      . promethazine  25 mg Intravenous Once   Continuous:   Results for orders placed or performed during the hospital encounter of 12/29/16 (from the past 24 hour(s))  CBC     Status: Abnormal   Collection Time: 12/29/16  1:58 PM   Result Value Ref Range   WBC 7.7 4.0 - 10.5 K/uL   RBC 5.91 (H) 4.22 - 5.81 MIL/uL   Hemoglobin 18.5 (H) 13.0 - 17.0 g/dL   HCT 54.1 (H) 39.0 - 52.0 %   MCV 91.5 78.0 - 100.0 fL   MCH 31.3 26.0 - 34.0 pg   MCHC 34.2 30.0 - 36.0 g/dL   RDW 18.8 (H) 11.5 - 15.5 %   Platelets  150 - 400 K/uL    PLATELET CLUMPS NOTED ON SMEAR, COUNT APPEARS ADEQUATE  Protime-INR     Status: None   Collection Time: 12/29/16  2:13 PM  Result Value Ref Range   Prothrombin Time 12.4 11.4 - 15.2 seconds   INR 0.93   I-Stat CG4 Lactic Acid, ED     Status: None   Collection Time: 12/29/16  2:46 PM  Result Value Ref Range   Lactic Acid, Venous 1.46 0.5 - 1.9 mmol/L  Type and screen [place]     Status: None   Collection Time: 12/29/16  3:14 PM  Result Value Ref Range   ABO/RH(D) O POS    Antibody Screen NEG    Sample Expiration 01/01/2017   Lactic acid, plasma     Status: None   Collection Time: 12/29/16  3:14 PM  Result Value Ref Range   Lactic Acid, Venous 1.6 0.5 - 1.9 mmol/L  Comprehensive metabolic panel     Status: Abnormal   Collection Time: 12/29/16  3:18 PM  Result Value Ref Range   Sodium 141 135 - 145 mmol/L   Potassium 5.2 (H) 3.5 - 5.1 mmol/L   Chloride 107 101 - 111 mmol/L   CO2 24 22 - 32 mmol/L   Glucose, Bld 95 65 - 99 mg/dL   BUN 18 6 - 20 mg/dL   Creatinine, Ser 1.58 (H) 0.61 - 1.24 mg/dL   Calcium 9.1 8.9 - 10.3 mg/dL   Total Protein 7.1 6.5 - 8.1 g/dL   Albumin 3.8 3.5 - 5.0 g/dL   AST 28 15 - 41 U/L   ALT 18 17 - 63 U/L   Alkaline Phosphatase 83 38 - 126 U/L   Total Bilirubin 0.8 0.3 - 1.2 mg/dL   GFR calc non Af Amer 52 (L) >60 mL/min   GFR calc Af Amer >60 >60 mL/min   Anion gap 10 5 - 15  I-stat chem 8, ed     Status: Abnormal   Collection Time: 12/29/16  3:51 PM  Result Value Ref Range   Sodium 143 135 - 145 mmol/L   Potassium 4.1 3.5 - 5.1 mmol/L   Chloride 107 101 - 111 mmol/L   BUN 23 (H) 6 - 20 mg/dL   Creatinine, Ser 1.50 (H) 0.61 -  1.24 mg/dL   Glucose, Bld 102 (H) 65 - 99 mg/dL   Calcium, Ion 1.12 (L) 1.15 - 1.40 mmol/L   TCO2 29 0 - 100 mmol/L   Hemoglobin 21.1 (HH) 13.0 - 17.0 g/dL   HCT 62.0 (H) 39.0 - 52.0 %  I-Stat venous blood gas, ED     Status: None   Collection Time: 12/29/16  3:51 PM  Result Value Ref Range   pH, Ven 7.277 7.250 - 7.430   pCO2, Ven 57.0 44.0 - 60.0 mmHg   pO2, Ven 41.0 32.0 - 45.0 mmHg   Bicarbonate 26.6 20.0 - 28.0 mmol/L   TCO2 28 0 - 100 mmol/L   O2 Saturation 69.0 %   Acid-base deficit 2.0 0.0 - 2.0 mmol/L   Patient temperature HIDE    Collection site BRACHIAL ARTERY    Sample type VENOUS   Lipase, blood     Status: None   Collection Time: 12/29/16  4:00 PM  Result Value Ref Range   Lipase 27 11 - 51 U/L  Amylase     Status: Abnormal   Collection Time: 12/29/16  4:00 PM  Result Value Ref Range   Amylase 186 (H) 28 - 100 U/L     Ct Head Wo Contrast  Result Date: 12/29/2016 CLINICAL DATA:  Headache and emesis EXAM: CT HEAD WITHOUT CONTRAST TECHNIQUE: Contiguous axial images were obtained from the base of the skull through the vertex without intravenous contrast. COMPARISON:  January 08, 2015 FINDINGS: Brain: The ventricles are normal in size and configuration. There is a small cavum septum pellucidum, an anatomic variant. There is no intracranial mass, hemorrhage, extra-axial fluid collection, or midline shift. Gray-white compartments appear normal. No acute infarct evident. Vascular: There is no hyperdense vessel. There are foci of calcification in distal vertebral arteries bilaterally as well as in the carotid siphon regions bilaterally. Skull: Bony calvarium appears intact. Sinuses/Orbits: There is slight mucosal thickening in each maxillary antrum. There is opacification in several ethmoid air cells bilaterally. Other paranasal sinuses  are clear. Orbits appear symmetric bilaterally. Other: There is opacification in several inferior mastoid air cells on the right. Visualized  mastoids elsewhere are clear. IMPRESSION: No intracranial mass, hemorrhage, or extra-axial fluid collection. Gray-white compartments appear normal. Age advanced atherosclerotic calcification in the carotid siphon and distal vertebral arteries bilaterally. Areas of paranasal sinus disease. Mild inferior right mastoid air cell disease. Electronically Signed   By: Lowella Grip III M.D.   On: 12/29/2016 16:53   Dg Chest Port 1 View  Result Date: 12/29/2016 CLINICAL DATA:  Emesis and nausea EXAM: PORTABLE CHEST 1 VIEW COMPARISON:  July 03, 2015 FINDINGS: Lungs are clear. Heart size and pulmonary vascularity are normal. No adenopathy. No bone lesions. IMPRESSION: No edema or consolidation. Electronically Signed   By: Lowella Grip III M.D.   On: 12/29/2016 14:35    ROS:  As stated above in the HPI otherwise negative.  Blood pressure 170/85, pulse (!) 28, temperature (!) 96.6 F (35.9 C), temperature source Rectal, resp. rate 19, SpO2 94 %.    PE: Gen: NAD, Alert and Oriented HEENT:  [place]/AT, EOMI Neck: Supple, no LAD Lungs: CTA Bilaterally CV: RRR without M/G/R ABM: Soft, NTND, +BS Ext: No C/C/E  Assessment/Plan: 1) Hematemesis. 2) Polycythemia. 3) S/p renal and pancreatic transplant. 4) Gastroparesis.   The patient may have a Mallory-Weiss tear precipitated by his gastroparesis.  I will perform an EGD for further evaluation.  He has an issue with polycythemia and he hypertensive.  No need for an emergent EGD at this time.    Plan: 1) EGD tomorrow. Kennedie Pardoe D 12/29/2016, 5:51 PM

## 2016-12-30 NOTE — ED Notes (Signed)
Paged Family Practice

## 2016-12-30 NOTE — ED Notes (Signed)
Pt wants to wait on the GI meds

## 2016-12-31 DIAGNOSIS — E1143 Type 2 diabetes mellitus with diabetic autonomic (poly)neuropathy: Secondary | ICD-10-CM

## 2016-12-31 DIAGNOSIS — K3184 Gastroparesis: Secondary | ICD-10-CM

## 2016-12-31 DIAGNOSIS — R1013 Epigastric pain: Secondary | ICD-10-CM

## 2016-12-31 LAB — CBC
HEMATOCRIT: 50.5 % (ref 39.0–52.0)
Hemoglobin: 17 g/dL (ref 13.0–17.0)
MCH: 31.4 pg (ref 26.0–34.0)
MCHC: 33.7 g/dL (ref 30.0–36.0)
MCV: 93.2 fL (ref 78.0–100.0)
PLATELETS: 204 10*3/uL (ref 150–400)
RBC: 5.42 MIL/uL (ref 4.22–5.81)
RDW: 15.7 % — AB (ref 11.5–15.5)
WBC: 8.3 10*3/uL (ref 4.0–10.5)

## 2016-12-31 LAB — BASIC METABOLIC PANEL
ANION GAP: 9 (ref 5–15)
BUN: 15 mg/dL (ref 6–20)
CALCIUM: 8.7 mg/dL — AB (ref 8.9–10.3)
CO2: 21 mmol/L — AB (ref 22–32)
CREATININE: 1.54 mg/dL — AB (ref 0.61–1.24)
Chloride: 109 mmol/L (ref 101–111)
GFR calc Af Amer: >60 mL/min (ref >60)
GFR, EST NON AFRICAN AMERICAN: 54 mL/min — AB (ref >60)
GLUCOSE: 115 mg/dL — AB (ref 65–99)
Potassium: 3.9 mmol/L (ref 3.5–5.1)
Sodium: 139 mmol/L (ref 135–145)

## 2016-12-31 MED ORDER — SULFAMETHOXAZOLE-TRIMETHOPRIM 400-80 MG PO TABS
1.0000 | ORAL_TABLET | Freq: Once | ORAL | Status: AC
  Administered 2016-12-31: 1 via ORAL
  Filled 2016-12-31: qty 1

## 2016-12-31 MED ORDER — LABETALOL HCL 5 MG/ML IV SOLN
10.0000 mg | INTRAVENOUS | Status: DC | PRN
  Administered 2017-01-01: 10 mg via INTRAVENOUS
  Filled 2016-12-31: qty 4

## 2016-12-31 MED ORDER — PANTOPRAZOLE SODIUM 40 MG IV SOLR
40.0000 mg | INTRAVENOUS | Status: DC
  Administered 2016-12-31 – 2017-01-01 (×2): 40 mg via INTRAVENOUS
  Filled 2016-12-31 (×2): qty 40

## 2016-12-31 MED ORDER — GI COCKTAIL ~~LOC~~
30.0000 mL | Freq: Once | ORAL | Status: DC
  Filled 2016-12-31: qty 30

## 2016-12-31 NOTE — Progress Notes (Signed)
This RN received a call from IV team concerning this pts orders for therapeutic phlebotomy.  It appears that this treatment was entered without orders from the pts current attending.  Paged FMTS, who is seeing the pt, and received a return call from Dr. Cyndia Skeeters, who advised that he approved of the pt receiving this treatment at this time.  Will page IV team to advise and continue to monitor.

## 2016-12-31 NOTE — Progress Notes (Signed)
I came by for a social visit as I know this pt well and wife had called me.  He is feeling better this afternoon in terms of his N and vomiting.  His blood pressure is still up and down and dependent on his discomfort.  He appears to be on the correct immune suppressing regimen and his creatinine is at his baseline.  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  When he needs phlebotomy he usually has HTN and a headache so think it might be good to do it while he is here.  I will check in on him again tomorrow.   Coretha Creswell A

## 2016-12-31 NOTE — Progress Notes (Signed)
Family Medicine Teaching Service Daily Progress Note Intern Pager: [phone]  Patient name: [name] Medical record number: [id number] Date of birth: [birth year] Age: 41 y.o. Gender: male  Primary Care Provider: Eloise Levels, MD Consultants: Gastroenterology  Code Status: FULL  Pt Overview and Major Events to Date:  12/29/16 admitted for hypertensive urgency  Assessment and Plan: WLLIAM GROSSO is a 41 y.o. male presenting with hematemesis. PMH is significant for T1DM with gastroparesis and s/p bilateral BKA, ESRD s/p combined kidney-pancreas transplant in 2014 on immunosuppressants, GERD, HTN.  Accelerated hypertension: Labile BP's ranging from 234/123-124/64 overnight. Blood pressure high when he is in pain (abdominal) and heaving per RN report yesterday.  No end organ damage. No chest pain or shortness of breath.  -Continue PRN  IV  labetalol 20 mg  for SBP greater than 160 or DBP greater than 100 -Continue home losartan. He hasn't gotten this since admission -Consider increasing hydralazine to 25 mg if BP not controlled with the above measures -Monitor BP closely and ensure that he gets his blood pressure medications as ordered -Monitor on telemetry -Emphasized pain and nausea control as below  Intractable N/V with hematemesis and Abdominal pain: Improved. Likely gastroparesis. KUB negative for obstructive pathology. Patient with gastroparesis and on Reglan at home. EGD is significant for hemorrhagic-appearing mucosa in all esophagus otherwise normal. Mesenteric ischemia is possible due to polycythemia but the lactic acid is negative. Influenza negative.  -Appreciate GI recs  -Clear liquid diet  -Continue PPI. 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 Baseline Cr ~1.6. Patient with history of DM-1 and  ESRD s/p kidney-pancreas transplant. Followed by [facility]. On immunosuppressive drugs (mycophenalate, tacrolimus, prednisone and prophylactic bactrim). Lipase within normal limits.  - BMP for creatinine - Continue home medicines - We will give Bactrim 400/80 today. He is on this MWF. Didn't get  yesterday because he was NPO  History of of DM-1 and ESRD s/p BKA and kidney-pancreas transplant. Now with gastroparesis. Last A1c 5.5 in 03/2016. - Repeat A1c. CBG stable so far  GERD on omeprazole 20mg  daily at home - Protonix 40 mg daily - GI cocktail as needed  FEN/GI:  -Liquid diet and advance as tolerated  -IV Protonix  Prophylaxis: no VTE ppx d/t active bleed and no SCDs since s/p bilateral BKAs.  Disposition: pending medical improvement  Subjective:  Patient is sleeping comfortably. Wife at bedside. She states that he was not able to sleep overnight due to nausea and heaving and abdominal pain. He hasn't had bowel movements for the last 3 days. She stated that he usually goes every other day. Blood pressure has been up and down  Objective: Temp:  [98.6 F (37 C)-99.4 F (37.4 C)] 99 F (37.2 C) (01/06 0400) Pulse Rate:  [94-111] 94 (01/06 0800) Resp:  [16-38] 22 (01/06 0800) BP: (124-244)/(58-123) 124/64 (01/06 0800) SpO2:  [96 %-100 %] 99 % (01/06 0800) Physical Exam: General: lying in bed comfortably, arises easily Cardiovascular: RRR, normal S1 and S2. No murmurs. Respiratory: CTAB, normal effort on room air Abdomen: soft, nontender, nondistended, + bs Extremities: bilateral BKA. No edema or skin changes Neuro: Sleeping but easily arises, no focal deficits  Laboratory:  Recent Labs Lab 12/29/16 1358  12/30/16 0329 12/30/16 1036 12/31/16 0331  WBC 7.7  --  8.9  --  8.3  HGB 18.5*  < > 17.7* 17.7* 17.0  HCT 54.1*  < > 52.3* 52.7* 50.5  PLT PLATELET CLUMPS NOTED ON SMEAR, COUNT APPEARS ADEQUATE  --  200  --  204  < > = values in this interval not  displayed.  Recent Labs Lab 12/29/16 1518 12/29/16 1551 12/30/16 0329 12/31/16 0331  NA 141 143 139 139  K 5.2* 4.1 3.6 3.9  CL 107 107 106 109  CO2 24  --  21* 21*  BUN 18 23* 14 15  CREATININE 1.58* 1.50* 1.56* 1.54*  CALCIUM 9.1  --  9.2 8.7*  PROT 7.1  --  7.2  --   BILITOT 0.8  --  0.7  --   ALKPHOS 83  --  77  --   ALT 18  --  15*  --   AST 28  --  19  --   GLUCOSE 95 102* 115* 115*    Imaging/Diagnostic Tests: Dg Abd Portable 1v  Result Date: 12/30/2016 CLINICAL DATA:  Abdominal pain with nausea and vomiting. EXAM: PORTABLE ABDOMEN - 1 VIEW COMPARISON:  Scout image for CT scan dated 10/11/2016 FINDINGS: The bowel gas pattern is normal. No radio-opaque calculi. Extensive arterial calcifications present in the abdomen. Bones are normal. IMPRESSION: Benign-appearing abdomen. Electronically Signed   By: Lorriane Shire M.D.   On: 12/30/2016 12:35   [name] Riding, MD 12/31/2016, 8:22 AM PGY-2, [place] Intern pager: [phone], text pages welcome

## 2016-12-31 NOTE — Progress Notes (Signed)
12/31/16  2130 IV Team- Left AC 18g x 1 attempt PIV started to withdraw 533ml blood for therapeutic phlebotomy per MD order. PIV d/c'd after blood withdrawn per pt request.

## 2017-01-01 DIAGNOSIS — E1143 Type 2 diabetes mellitus with diabetic autonomic (poly)neuropathy: Secondary | ICD-10-CM

## 2017-01-01 DIAGNOSIS — K3184 Gastroparesis: Secondary | ICD-10-CM

## 2017-01-01 LAB — RENAL FUNCTION PANEL
ALBUMIN: 2.9 g/dL — AB (ref 3.5–5.0)
ANION GAP: 8 (ref 5–15)
BUN: 13 mg/dL (ref 6–20)
CHLORIDE: 108 mmol/L (ref 101–111)
CO2: 21 mmol/L — ABNORMAL LOW (ref 22–32)
Calcium: 8.3 mg/dL — ABNORMAL LOW (ref 8.9–10.3)
Creatinine, Ser: 1.48 mg/dL — ABNORMAL HIGH (ref 0.61–1.24)
GFR calc non Af Amer: 57 mL/min — ABNORMAL LOW (ref >60)
Glucose, Bld: 93 mg/dL (ref 65–99)
PHOSPHORUS: 2.8 mg/dL (ref 2.5–4.6)
POTASSIUM: 4 mmol/L (ref 3.5–5.1)
Sodium: 137 mmol/L (ref 135–145)

## 2017-01-01 LAB — CBC
HEMATOCRIT: 45.8 % (ref 39.0–52.0)
Hemoglobin: 15.1 g/dL (ref 13.0–17.0)
MCH: 30.7 pg (ref 26.0–34.0)
MCHC: 33 g/dL (ref 30.0–36.0)
MCV: 93.1 fL (ref 78.0–100.0)
Platelets: 205 10*3/uL (ref 150–400)
RBC: 4.92 MIL/uL (ref 4.22–5.81)
RDW: 15.5 % (ref 11.5–15.5)
WBC: 8 10*3/uL (ref 4.0–10.5)

## 2017-01-01 LAB — URINE CULTURE: CULTURE: NO GROWTH

## 2017-01-01 LAB — HEMOGLOBIN A1C
HEMOGLOBIN A1C: 5.4 % (ref 4.8–5.6)
Mean Plasma Glucose: 108 mg/dL

## 2017-01-01 MED ORDER — HYDRALAZINE HCL 25 MG PO TABS
25.0000 mg | ORAL_TABLET | Freq: Four times a day (QID) | ORAL | Status: DC
  Administered 2017-01-01 – 2017-01-02 (×4): 25 mg via ORAL
  Filled 2017-01-01 (×4): qty 1

## 2017-01-01 MED ORDER — PANTOPRAZOLE SODIUM 40 MG PO TBEC
40.0000 mg | DELAYED_RELEASE_TABLET | Freq: Every day | ORAL | Status: DC
  Administered 2017-01-01 – 2017-01-02 (×2): 40 mg via ORAL
  Filled 2017-01-01 (×2): qty 1

## 2017-01-01 MED ORDER — LABETALOL HCL 100 MG PO TABS
100.0000 mg | ORAL_TABLET | Freq: Two times a day (BID) | ORAL | Status: DC
  Administered 2017-01-01 – 2017-01-02 (×3): 100 mg via ORAL
  Filled 2017-01-01 (×3): qty 1

## 2017-01-01 MED ORDER — AMLODIPINE BESYLATE 5 MG PO TABS
5.0000 mg | ORAL_TABLET | Freq: Every day | ORAL | Status: DC
  Administered 2017-01-01 – 2017-01-02 (×2): 5 mg via ORAL
  Filled 2017-01-01 (×2): qty 1

## 2017-01-01 MED ORDER — METOCLOPRAMIDE HCL 10 MG PO TABS
10.0000 mg | ORAL_TABLET | Freq: Two times a day (BID) | ORAL | Status: DC
  Administered 2017-01-01 – 2017-01-02 (×3): 10 mg via ORAL
  Filled 2017-01-01 (×3): qty 1

## 2017-01-01 NOTE — Progress Notes (Signed)
Pt refusing PRN doses of labetalol.  Pt wife states "we don't want him to take anything extra, his blood pressure will come down on its own".  He has been accepting his scheduled doses of hydralazine. Of note he accepted a dose of 10mg  labetalol at 0330 (BP186/102).  Will continue to monitor.

## 2017-01-01 NOTE — Progress Notes (Signed)
Family Medicine Teaching Service Daily Progress Note Intern Pager: [phone]  Patient name: [name] Medical record number: [id number] Date of birth: 12-02-1976 Age: 41 y.o. Gender: male  Primary Care Provider: Eloise Levels, MD Consultants: Gastroenterology  Code Status: FULL  Pt Overview and Major Events to Date:  12/29/16 admitted for hypertensive urgency  Assessment and Plan: CHARLI LIBERATORE is a 41 y.o. male presenting with hematemesis. PMH is significant for T1DM with gastroparesis and s/p bilateral BKA, ESRD s/p combined kidney-pancreas transplant in 2014 on immunosuppressants, GERD, HTN.  Accelerated hypertension: Labile BP's ranging from 212/112-124/71. Patient and wife refusing PRN labetolol per RN overnight.  No end organ damage. No chest pain or shortness of breath. He had therapeutic phlebotomy for polycythemia overnight.  -Continue home losartan 50 mg daily.  -Stop IV hydralazine and labetolol-lost IV line -PO hydralazine 25 mg four times a day -PO amlodipine 5 mg daily -PO Labetalol 100 mg twice a day  -Consider IM hydralazine as needed high BP -Monitor on telemetry  Intractable N/V:  Likely due to gastroparesis. Hematemesis & abdominal pain resolved. Continues to endorse poor oral intakes, nausea and vomiting. KUB negative for obstructive pathology. EGD is significant for hemorrhagic-appearing mucosa in all esophagus otherwise normal.. Influenza blood and urine cultures negative -Appreciate GI recs  -Clear liquid diet  -PO Protonix 40 mg daily - IV fluid stopped by his nephrologist - PO Zofran or phenegran - Scheduled Reglan twice a day - Monitor hemoglobin  Constipation: No bowel movement in the last 4 days in the setting of poor by mouth intake and intractable nausea and vomiting. Refuses enema -Scheduled Reglan 10 mg twice a day   CKD-2:  Cr 1.48 (1.56 on admission). Baseline Cr ~1.6. Patient with history of DM-1 and ESRD s/p kidney-pancreas  transplant. Followed by [facility]. On immunosuppressive drugs (mycophenalate, tacrolimus, prednisone and prophylactic bactrim). Lipase within normal limits.  - BMP for creatinine - Continue home medicines - We will give Bactrim 400/80 today. He is on this MWF. Didn't get  yesterday because he was NPO  History of of DM-1 and ESRD s/p BKA and kidney-pancreas transplant. Now with gastroparesis. Last A1c 5.5 in 03/2016. - Repeat A1c. CBG stable so far  Polycythemia: s/p therapeutic phlebotomy on 1/6 per Dr. Corliss Parish. 500 ml taken out -Hgb 17>>15  GERD on omeprazole 20mg  daily at home - Protonix 40 mg daily - GI cocktail as needed  FEN/GI:  -Liquid diet and advance as tolerated  -IV Protonix  Prophylaxis: not on VTE ppx d/t active bleed and no SCDs since s/p bilateral BKAs.  Disposition: pending medical improvement  Subjective:  Comfortably sleeping in the bed while in the IV team is trying to get an IV line in. Continues to endorse nausea and vomiting. Denies abdominal pain. No bowel movement in the last 4 days. Refuses enema.   Objective: Temp:  [98.3 F (36.8 C)-99 F (37.2 C)] 98.9 F (37.2 C) (01/07 0000) Pulse Rate:  [94-110] 110 (01/07 0000) Resp:  [18-27] 27 (01/07 0000) BP: (124-212)/(64-112) 189/95 (01/07 0000) SpO2:  [98 %-100 %] 99 % (01/07 0000) Physical Exam: General: lying in bed comfortably Cardiovascular: RRR, normal S1 and S2. No murmurs. Respiratory: CTAB, normal effort on room air Abdomen: soft, nontender, nondistended, + bs Extremities: bilateral BKA. No edema or skin changes Neuro: Sleeping but easily arises, no focal deficits  Laboratory:  Recent Labs Lab 12/30/16 0329 12/30/16 1036 12/31/16 0331 01/01/17 0159  WBC 8.9  --  8.3 8.0  HGB 17.7* 17.7* 17.0 15.1  HCT 52.3* 52.7* 50.5 45.8  PLT 200  --  204 205    Recent Labs Lab 12/29/16 1518  12/30/16 0329 12/31/16 0331 01/01/17 0159  NA 141  < > 139 139 137  K  5.2*  < > 3.6 3.9 4.0  CL 107  < > 106 109 108  CO2 24  --  21* 21* 21*  BUN 18  < > 14 15 13   CREATININE 1.58*  < > 1.56* 1.54* 1.48*  CALCIUM 9.1  --  9.2 8.7* 8.3*  PROT 7.1  --  7.2  --   --   BILITOT 0.8  --  0.7  --   --   ALKPHOS 83  --  77  --   --   ALT 18  --  15*  --   --   AST 28  --  19  --   --   GLUCOSE 95  < > 115* 115* 93  < > = values in this interval not displayed.  Imaging/Diagnostic Tests: No results found. [name] Riding, MD 01/01/2017, 4:25 AM PGY-2, [place] Intern pager: [phone], text pages welcome

## 2017-01-01 NOTE — Progress Notes (Signed)
Looks in on pt today.  N/V better- he is keeping some things down.  BP continues to be an issue.  I did not realize yest was being given fluids, this is likely making BP higher so I have stopped.  Were able to do successful phlebotomy and he feels better possibly due to that.  BP remains high.  I agree with OP losartan- however, I do not think needs 3 additional agents long term for BP- I would stop hydralazine as similar mechanism to losartan.  Once BP is down can look to discharge pt   [name]

## 2017-01-02 ENCOUNTER — Encounter (HOSPITAL_COMMUNITY): Payer: Self-pay | Admitting: Gastroenterology

## 2017-01-02 LAB — BASIC METABOLIC PANEL
ANION GAP: 7 (ref 5–15)
BUN: 13 mg/dL (ref 6–20)
CALCIUM: 8.6 mg/dL — AB (ref 8.9–10.3)
CO2: 25 mmol/L (ref 22–32)
CREATININE: 1.58 mg/dL — AB (ref 0.61–1.24)
Chloride: 108 mmol/L (ref 101–111)
GFR calc Af Amer: >60 mL/min (ref >60)
GFR, EST NON AFRICAN AMERICAN: 52 mL/min — AB (ref >60)
Glucose, Bld: 95 mg/dL (ref 65–99)
Potassium: 3.7 mmol/L (ref 3.5–5.1)
SODIUM: 140 mmol/L (ref 135–145)

## 2017-01-02 LAB — CBC
HCT: 46.3 % (ref 39.0–52.0)
Hemoglobin: 15 g/dL (ref 13.0–17.0)
MCH: 30.6 pg (ref 26.0–34.0)
MCHC: 32.4 g/dL (ref 30.0–36.0)
MCV: 94.5 fL (ref 78.0–100.0)
Platelets: 183 10*3/uL (ref 150–400)
RBC: 4.9 MIL/uL (ref 4.22–5.81)
RDW: 15.8 % — AB (ref 11.5–15.5)
WBC: 6.3 10*3/uL (ref 4.0–10.5)

## 2017-01-02 MED ORDER — LABETALOL HCL 100 MG PO TABS: 100.0000 mg | ORAL_TABLET | Freq: Two times a day (BID) | ORAL | 0 refills | Status: DC

## 2017-01-02 MED ORDER — OMEPRAZOLE 20 MG PO CPDR: 20.0000 mg | DELAYED_RELEASE_CAPSULE | Freq: Every day | ORAL | 0 refills | Status: DC | PRN

## 2017-01-02 MED ORDER — AMLODIPINE BESYLATE 5 MG PO TABS: 5.0000 mg | ORAL_TABLET | Freq: Every day | ORAL | 0 refills | Status: DC

## 2017-01-02 NOTE — Care Management Important Message (Signed)
Important Message  Patient Details  Name: [name] MRN: [id number] Date of Birth: 08-15-1976   Medicare Important Message Given:  Yes    Torrell Krutz Abena 01/02/2017, 1:35 PM

## 2017-01-02 NOTE — Progress Notes (Signed)
Family Medicine Teaching Service Daily Progress Note Intern Pager: [phone]  Patient name: [name] Medical record number: [id number] Date of birth: 1976/01/03 Age: 41 y.o. Gender: male  Primary Care Provider: Eloise Levels, MD Consultants: Gastroenterology  Code Status: FULL  Pt Overview and Major Events to Date:  12/29/16 admitted for hypertensive urgency  Assessment and Plan: CARNELIUS HAMMITT is a 41 y.o. male presenting with hematemesis. PMH is significant for T1DM with gastroparesis and s/p bilateral BKA, ESRD s/p combined kidney-pancreas transplant in 2014 on immunosuppressants, GERD, HTN.  Accelerated hypertension: Labile BP's ranging from 179/92-121/67, improving with therapeutic phlebotomy.  No end organ damage. No chest pain or shortness of breath.  -Continue home losartan 50 mg daily.  -DC hydralazine 25 mg four times a day -PO amlodipine 5 mg daily -PO Labetalol 100 mg twice a day  -Monitor on telemetry  Intractable N/V:  Likely due to gastroparesis. Hematemesis & abdominal pain resolved. Continues to endorse poor oral intakes, nausea and vomiting. KUB negative for obstructive pathology. EGD is significant for hemorrhagic-appearing mucosa in all esophagus otherwise normal.. Influenza blood and urine cultures negative -Per GI, PO Protonix 40 mg daily - IV fluid stopped by his nephrologist, recommends DC hydralazine - PO Zofran or phenegran - Scheduled Reglan twice a day - Monitor hemoglobin  Constipation: No bowel movement in the last 4 days in the setting of poor by mouth intake and intractable nausea and vomiting. Refuses enema -Scheduled Reglan 10 mg twice a day   CKD-2:  Cr 1.58 Baseline ~1.6. Patient with history of DM-1 and ESRD s/p kidney-pancreas transplant. Followed by [facility]. On immunosuppressive drugs (mycophenalate, tacrolimus, prednisone and prophylactic bactrim). Lipase within normal limits.  - BMP for creatinine - Continue  home medicines - We will give Bactrim 400/80 today. He is on this MWF. Didn't get  yesterday because he was NPO  History of of DM-1 and ESRD s/p BKA and kidney-pancreas transplant. Now with gastroparesis. Last A1c 5.5 in 03/2016. - Repeat A1c. CBG stable so far  Polycythemia: s/p therapeutic phlebotomy on 1/6 per Dr. Corliss Parish. 500 ml taken out -Hgb 17>>15  GERD on omeprazole 20mg  daily at home - Protonix 40 mg daily - GI cocktail as needed  FEN/GI:  -Liquid diet and advance as tolerated  -IV Protonix  Prophylaxis: not on VTE ppx d/t active bleed and no SCDs since s/p bilateral BKAs.  Disposition: home today 01/02/17  Subjective:  Feels well today, wants to go home. No concerns today.   Objective: Temp:  [98.5 F (36.9 C)-99.2 F (37.3 C)] 98.8 F (37.1 C) (01/08 0503) Pulse Rate:  [83-99] 85 (01/08 0503) Resp:  [18-23] 18 (01/08 0503) BP: (121-179)/(65-92) 154/66 (01/08 0503) SpO2:  [98 %-100 %] 100 % (01/08 0503) Physical Exam: General: lying in bed comfortably Cardiovascular: RRR, normal S1 and S2. No murmurs. Respiratory: CTAB, normal effort on room air Abdomen: soft, nontender, nondistended, + bs Extremities: bilateral BKA. No edema or skin changes Neuro: Sleeping but easily arises, no focal deficits  Laboratory:  Recent Labs Lab 12/30/16 0329 12/30/16 1036 12/31/16 0331 01/01/17 0159  WBC 8.9  --  8.3 8.0  HGB 17.7* 17.7* 17.0 15.1  HCT 52.3* 52.7* 50.5 45.8  PLT 200  --  204 205    Recent Labs Lab 12/29/16 1518  12/30/16 0329 12/31/16 0331 01/01/17 0159  NA 141  < > 139 139 137  K 5.2*  < > 3.6 3.9 4.0  CL 107  < >  106 109 108  CO2 24  --  21* 21* 21*  BUN 18  < > 14 15 13   CREATININE 1.58*  < > 1.56* 1.54* 1.48*  CALCIUM 9.1  --  9.2 8.7* 8.3*  PROT 7.1  --  7.2  --   --   BILITOT 0.8  --  0.7  --   --   ALKPHOS 83  --  77  --   --   ALT 18  --  15*  --   --   AST 28  --  19  --   --   GLUCOSE 95  < > 115* 115* 93  < > =  values in this interval not displayed.  Imaging/Diagnostic Tests: No results found.   Bufford Lope, DO 01/02/2017, 7:19 AM PGY-1, [place] Intern pager: [phone], text pages welcome

## 2017-01-02 NOTE — Progress Notes (Signed)
Pt was transferred from 4E on arrival pt was fully alert self introduce to pt and wife, ID bracelet checked fall prevention plan discussed with pt, pt able to demonstrate how to use the call light and call for help when needed, treatment started as prescribed, pt was feeling nauseous, couldn't take his hydralazine at 12 as scheduled but took it at 0300, the 0600 hydralazine not given but move to 9am since it is not up to 6hours intervals, will continue to monitor

## 2017-01-02 NOTE — Care Management Note (Signed)
Case Management Note  Patient Details  Name: [name] MRN: [id number] Date of Birth: [birth year]  Subjective/Objective:          Presented with hematemesis. PMH is significant for T1DM with gastroparesis and s/p bilateral BKA, ESRD s/p combined kidney-pancreas transplant in 2014 on immunosuppressants, GERD, HTN.  From home with wife( RN)/children.     PCP:  Rosana Berger  Action/Plan: Plan is for d/c today.  Expected Discharge Date:    01/02/2017           Expected Discharge Plan:  Home/Self Care  In-House Referral:     Discharge planning Services  CM Consult  Post Acute Care Choice:    Choice offered to:     DME Arranged:    DME Agency:     HH Arranged:    HH Agency:     Status of Service:  Completed, signed off  If discussed at [employer] of Stay Meetings, dates discussed:    Additional Comments: CM spoke with pt/wife regarding d/c planning and wife stated plan is to for pt to care for self and she will assist if needed @ d/c.  Whitman Hero [place], RN 01/02/2017, 10:34 AM

## 2017-01-02 NOTE — Discharge Summary (Signed)
[facility] Discharge Summary  Patient name: [name] Medical record number: [id number] Date of birth: [birth year] Age: 41 y.o. Gender: male Date of Admission: 12/29/2016  Date of Discharge: 01/02/17 Admitting Physician: Kinnie Feil, MD  Primary Care Provider: Eloise Levels, MD Consultants: Gastroenterology  Indication for Hospitalization: Hypertensive urgency  Discharge Diagnoses/Problem List:  Accelerated HTN Hematemesis with intractable nausea/vomiting Constipation CKD Stage 2 H/o DM1 s/p BKA and ESRD s/p combined kidney-pancreas transplant Polycythemia vera  GERD  Disposition: Home  Discharge Condition: Stable, improved  Discharge Exam:  General: lying in bed comfortably Cardiovascular: RRR, normal S1 and S2. No murmurs. Respiratory: CTAB, normal effort on room air Abdomen: soft, nontender, nondistended, + bs Extremities: bilateral BKA. No edema or skin changes Neuro: Sleeping but easily arises, no focal deficits  Brief Hospital Course:  Hematemesis 2/2 intractable nausea and vomiting Patient presented with c/o hematemesis after feeling nauseous and retching which slowly improved by the time of admission. Thought to be a [employer] tear given the onset and GI was consulted. EGD showed hemorrhagic-appearing mucosa at lower third of esophagus but no other abnormalities. Patient's hemoglobin was stable given his h/o polycythemia vera and his n/v resolved by discharge.   Accelerated HTN Patient was found to be in hypertensive urgency to BP 256/113 on admit. He was initially started on nicardipine gtt but was stable enough to be transitioned to IV antihypertensives. His blood pressure was slowly normalized with antihypertensives and a one time therapeutic phlebotomy. On day of discharge his blood pressure was much improved and stable but ranging from 179/92-121/67, he was deemed stable for discharge on losartan 50 mg daily, amlodipine  5 mg daily and labetalol 100 mg twice a day.  Issues for Follow Up:  1. Please follow up on blood pressures with new antihypertensive regimen losartan 50 mg daily, amlodipine 5 mg daily and labetalol 100 mg twice a day.  Significant Procedures: EGD  Significant Labs and Imaging:   Recent Labs Lab 12/31/16 0331 01/01/17 0159 01/02/17 0815  WBC 8.3 8.0 6.3  HGB 17.0 15.1 15.0  HCT 50.5 45.8 46.3  PLT 204 205 183    Recent Labs Lab 12/29/16 1518 12/29/16 1551 12/30/16 0329 12/31/16 0331 01/01/17 0159 01/02/17 0815  NA 141 143 139 139 137 140  K 5.2* 4.1 3.6 3.9 4.0 3.7  CL 107 107 106 109 108 108  CO2 24  --  21* 21* 21* 25  GLUCOSE 95 102* 115* 115* 93 95  BUN 18 23* 14 15 13 13   CREATININE 1.58* 1.50* 1.56* 1.54* 1.48* 1.58*  CALCIUM 9.1  --  9.2 8.7* 8.3* 8.6*  PHOS  --   --   --   --  2.8  --   ALKPHOS 83  --  77  --   --   --   AST 28  --  19  --   --   --   ALT 18  --  15*  --   --   --   ALBUMIN 3.8  --  3.8  --  2.9*  --     Results/Tests Pending at Time of Discharge: none  Discharge Medications:  Allergies as of 01/02/2017   No Known Allergies     Medication List    TAKE these medications   amLODipine 5 MG tablet Commonly known as:  NORVASC Take 1 tablet (5 mg total) by mouth daily. Start taking on:  01/03/2017   aspirin EC  81 MG tablet Take 81 mg by mouth daily.   cyclobenzaprine 10 MG tablet Commonly known as:  FLEXERIL Take 1 tablet (10 mg total) by mouth 2 (two) times daily as needed for muscle spasms.   labetalol 100 MG tablet Commonly known as:  NORMODYNE Take 1 tablet (100 mg total) by mouth 2 (two) times daily.   losartan 50 MG tablet Commonly known as:  COZAAR Take 50 mg by mouth daily.   metoCLOPramide 10 MG tablet Commonly known as:  REGLAN Take 1 tablet (10 mg total) by mouth every 6 (six) hours as needed for nausea.   MYFORTIC 180 MG EC tablet Generic drug:  mycophenolate Take 360 mg by mouth 2 (two) times daily.    omeprazole 20 MG capsule Commonly known as:  PRILOSEC Take 1 capsule (20 mg total) by mouth daily as needed (heartburn).   ondansetron 4 MG disintegrating tablet Commonly known as:  ZOFRAN ODT Take 1 tablet (4 mg total) by mouth every 8 (eight) hours as needed for nausea or vomiting.   Oxycodone HCl 10 MG Tabs Take 10 mg by mouth every 6 (six) hours as needed (pain).   polyethylene glycol packet Commonly known as:  MIRALAX / GLYCOLAX Take 17 g by mouth daily as needed for mild constipation.   predniSONE 5 MG tablet Commonly known as:  DELTASONE Take 5 mg by mouth daily.   promethazine 25 MG tablet Commonly known as:  PHENERGAN Take 25 mg by mouth every 6 (six) hours as needed for nausea or vomiting.   ranitidine 150 MG tablet Commonly known as:  ZANTAC Take 1 tablet (150 mg total) by mouth 2 (two) times daily.   sucralfate 1 GM/10ML suspension Commonly known as:  CARAFATE Take 10 mLs (1 g total) by mouth 4 (four) times daily -  with meals and at bedtime.   sulfamethoxazole-trimethoprim 400-80 MG tablet Commonly known as:  BACTRIM,SEPTRA Take 1 tablet by mouth every Monday, Wednesday, and Friday.   tacrolimus 1 MG capsule Commonly known as:  PROGRAF Take 2-3 mg by mouth 2 (two) times daily. 3 mg in the morning, 2 mg in the evening       Discharge Instructions: Please refer to Patient Instructions section of EMR for full details.  Patient was counseled important signs and symptoms that should prompt return to medical care, changes in medications, dietary instructions, activity restrictions, and follow up appointments.   Follow-Up Appointments: Follow-up Information    Eloise Levels, MD. Go on 01/09/2017.   Why:  Please go to your hospital follow up appointment at 2:30pm on Monday 01/09/17, arrive 52min early for check in. Contact information: [place] [address] [phone]           Bufford Lope, DO 01/02/2017, 12:13 PM PGY-1, [place]

## 2017-01-02 NOTE — Discharge Instructions (Signed)
You were admitted for hypertensive urgency due to very high blood pressures. Please continue these blood pressure medications at home: Losartan 50mg  daily Amlodipine 5mg  daily Labetalol 100mg  twice a day  Since you also had a GI bleed causing bloody vomit, please continue taking your home omeprazole daily.

## 2017-01-04 LAB — CULTURE, BLOOD (ROUTINE X 2)
Culture: NO GROWTH
Culture: NO GROWTH

## 2017-01-09 ENCOUNTER — Inpatient Hospital Stay: Payer: Medicare Other | Admitting: Family Medicine

## 2017-01-24 ENCOUNTER — Ambulatory Visit: Payer: Medicare Other | Admitting: Family

## 2017-02-17 ENCOUNTER — Ambulatory Visit (INDEPENDENT_AMBULATORY_CARE_PROVIDER_SITE_OTHER): Payer: Medicare Other | Admitting: Family

## 2017-02-17 ENCOUNTER — Encounter: Payer: Self-pay | Admitting: Family

## 2017-02-17 VITALS — BP 148/100 | HR 84 | Temp 98.3°F | Resp 16 | Ht 73.0 in | Wt 174.8 lb

## 2017-02-17 DIAGNOSIS — R1310 Dysphagia, unspecified: Secondary | ICD-10-CM | POA: Insufficient documentation

## 2017-02-17 DIAGNOSIS — I10 Essential (primary) hypertension: Secondary | ICD-10-CM | POA: Diagnosis not present

## 2017-02-17 DIAGNOSIS — K3184 Gastroparesis: Secondary | ICD-10-CM

## 2017-02-17 DIAGNOSIS — Z940 Kidney transplant status: Secondary | ICD-10-CM

## 2017-02-17 NOTE — Assessment & Plan Note (Signed)
Blood pressure remains elevated about goal of 140/90 with current medication regimen. Denies worst headache of life with no new symptoms of end organ damage noted on physical exam. Recommend follow up with nephrology for medication adjustment as he is s/p kidney and pancreas transplant.

## 2017-02-17 NOTE — Progress Notes (Signed)
Subjective:    Patient ID: [name], male    DOB: [birth year], 41 y.o.   MRN: [id number]  Chief Complaint  Patient presents with  . Establish Care    Referral to GI, has trouble swallowing pills without if feeling like its getting stuck, was told he could have gastroperesis    HPI:  [name] is a 41 y.o. 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 and presents today for an office visit.   Care Team:  1.) Judie Grieve, MD - Nephrologist 2.) Cherylann Ratel, MD - Nephrologist / Transplant  1.) Difficulty swallowing / [name] - This is a new problem. Associated symptoms of difficulty swallowing pills and occasional water and describing feeling like it gets stuck. He has a previous EGD that showed it no narrowing or any abnormal findings. Reports getting full easily. No coughing after eating. Every few months he gets sick with nausea and vomiting.    2.) Hypertension - Currently maintained on losartan. Reports taking the medication as prescribed and denies adverse side effects. Denies worse headache of life with no new symptoms of end organ damage. Working on following a low-sodium diet. Physical activity consists of walking throughout the day.  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 Managed by nephrology and transplant through [facility]. No current renal symptoms. Denies any fevers or signs of rejection.   Allergies  Allergen Reactions  . Lisinopril     Cough  .  Amlodipine Rash      Outpatient Medications Prior to Visit  Medication Sig Dispense Refill  . aspirin EC 81 MG tablet Take 81 mg by mouth daily.    [name] losartan (COZAAR) 50 MG tablet Take 50 mg by mouth daily.     . metoCLOPramide (REGLAN) 10 MG tablet Take 1 tablet (10 mg total) by mouth every 6 (six) hours as needed for nausea. 30 tablet 0  . mycophenolate (MYFORTIC) 180 MG EC tablet Take 360 mg by mouth 2 (two) times daily.     [name] omeprazole (PRILOSEC) 20 MG capsule Take 1 capsule (20 mg total) by mouth daily as needed (heartburn). 30 capsule 0  . ondansetron (ZOFRAN ODT) 4 MG disintegrating tablet Take 1 tablet (4 mg total) by mouth every 8 (eight) hours as needed for nausea or vomiting. 20 tablet 0  . predniSONE (DELTASONE) 5 MG tablet Take 5 mg by mouth daily.     [name] sulfamethoxazole-trimethoprim (BACTRIM,SEPTRA) 400-80 MG per tablet Take 1 tablet by mouth every Monday, Wednesday, and Friday.     . tacrolimus (PROGRAF) 1 MG capsule Take 2-3 mg by mouth 2 (two) times daily. 3 mg in the morning, 2 mg in the evening    . polyethylene glycol (MIRALAX / GLYCOLAX) packet Take 17 g by mouth daily as needed for mild constipation. 14 each 0  . amLODipine (NORVASC) 5 MG tablet Take 1 tablet (5 mg total) by mouth daily. 30 tablet 0  . cyclobenzaprine (FLEXERIL) 10 MG tablet Take 1 tablet (10 mg total) by mouth 2 (two) times daily as needed for muscle spasms. (Patient not taking: Reported  on 12/29/2016) 10 tablet 0  . labetalol (NORMODYNE) 100 MG tablet Take 1 tablet (100 mg total) by mouth 2 (two) times daily. 60 tablet 0  . Oxycodone HCl 10 MG TABS Take 10 mg by mouth every 6 (six) hours as needed (pain).     . promethazine (PHENERGAN) 25 MG tablet Take 25 mg by mouth every 6 (six) hours as needed for nausea or vomiting.    . ranitidine (ZANTAC) 150 MG tablet Take 1 tablet (150 mg total) by mouth 2 (two) times daily. 60 tablet 0  . sucralfate (CARAFATE) 1 GM/10ML suspension Take 10 mLs (1 g total) by  mouth 4 (four) times daily -  with meals and at bedtime. 420 mL 0   No facility-administered medications prior to visit.      Past Medical History:  Diagnosis Date  . 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 Arthritis    "I think I do; just in my fingers & my hands"  . Blood transfusion   . Chronic pain   . Depression   . Diabetes mellitus without complication ([place])    no since pancreas transplant  . Dialysis patient [facility]) 04/18/12   "[facility]; Tues, [place], [place]"  . Edema   . ESRD (end stage renal disease) on dialysis ([place])   . Gastroparesis   . Gastropathy   . GERD (gastroesophageal reflux disease)   . Headache(784.0)   . Hypertension       Past Surgical History:  Procedure Laterality Date  . AV FISTULA PLACEMENT  08/2011   left upper arm  . BELOW KNEE LEG AMPUTATION  "it's been awhile"   bilaterally  . CATARACT EXTRACTION  ~ 2011   right  . COMBINED KIDNEY-PANCREAS TRANSPLANT    . ESOPHAGOGASTRODUODENOSCOPY N/A 12/30/2016   Procedure: ESOPHAGOGASTRODUODENOSCOPY (EGD);  Surgeon: Carol Ada, MD;  Location: [facility] ENDOSCOPY;  Service: Endoscopy;  Laterality: N/A;      Family History  Problem Relation Age of Onset  . Diabetes Other   . Hypertension Other   . Hypertension Mother   . Diabetes Mother   . Kidney disease Mother   . Diabetes Maternal Grandmother   . Diabetes Paternal Grandmother       Social History   Social History  . Marital status: Married    Spouse name: N/A  . Number of children: 1  . Years of education: 22   Occupational History  . Disability    Social History Main Topics  . Smoking status: Current Some Day Smoker  . Smokeless tobacco: Never Used  . Alcohol use No  . Drug use: Yes    Frequency: 3.0 times per week    Types: Marijuana     Comment: Occasionally  . Sexual activity: Not Currently   Other Topics Concern  . Not on file   Social History Narrative   Fun: Restore old cars       Review of Systems   Constitutional: Negative for chills and fever.  Eyes:       Negative for changes in vision  Respiratory: Negative for cough, chest tightness, shortness of breath and wheezing.   Cardiovascular: Negative for chest pain, palpitations and leg swelling.  Endocrine: Negative for polydipsia, polyphagia and polyuria.  Neurological: Negative for dizziness, weakness, light-headedness and headaches.       Objective:    BP (!) 148/100 (BP Location: Left Arm, Patient Position: Sitting, Cuff Size: Normal)   Pulse 84   Temp 98.3 F (  36.8 C) (Oral)   Resp 16   Ht 6\' 1"  (1.854 m)   Wt 174 lb 12.8 oz (79.3 kg)   SpO2 97%   BMI 23.06 kg/m  Nursing note and vital signs reviewed.  Physical Exam  Constitutional: He is oriented to person, place, and time. He appears well-developed and well-nourished. No distress.  HENT:  Right Ear: Hearing, tympanic membrane, external ear and ear canal normal.  Left Ear: Hearing, tympanic membrane, external ear and ear canal normal.  Nose: Nose normal.  Mouth/Throat: Uvula is midline, oropharynx is clear and moist and mucous membranes are normal.  Cardiovascular: Normal rate, regular rhythm, normal heart sounds and intact distal pulses.   Pulmonary/Chest: Effort normal and breath sounds normal.  Abdominal: Normal appearance and bowel sounds are normal. There is no hepatosplenomegaly. There is no tenderness. There is no rigidity, no rebound, no guarding, no tenderness at McBurney's point and negative Murphy's sign.  Musculoskeletal:  Bilateral lower extremity amputations below knee.  Neurological: He is alert and oriented to person, place, and time.  Skin: Skin is warm and dry.  Psychiatric: He has a normal mood and affect. His behavior is normal. Judgment and thought content normal.        Assessment & Plan:   Problem List Items Addressed This Visit      Cardiovascular and Mediastinum   HYPERTENSION, BENIGN SYSTEMIC (Chronic)    Blood pressure remains  elevated about goal of 140/90 with current medication regimen. Denies worst headache of life with no new symptoms of end organ damage noted on physical exam. Recommend follow up with nephrology for medication adjustment as he is s/p kidney and pancreas transplant.         Digestive   Gastroparesis    Previously diagnosed with gastroparesis likely associated with Type 1 diabetes and is refractory to current dosage of Reglan. Refer to GI for further assessment and treatment. Recommend frequent small meals as tolerated.       Difficulty swallowing - Primary    Difficulty swallowing pills and water on occasion with previous EGD showing no evidence of esophogeal stricture. Does not sound like aspiration. Consider referral to ENT. Patient will also discuss with GI.       Relevant Orders   Ambulatory referral to Gastroenterology     Other   Renal transplant, status post (Chronic)    Status post renal transplant and pancreas transplant stable with Prograf and Myfortic and managed by transplant medicine and nephrology. No rejection symptoms. Continue to monitor with follow-up per nephrology and transplant medicine.          I have discontinued Mr. Korpela's cyclobenzaprine, polyethylene glycol, Oxycodone HCl, sucralfate, ranitidine, amLODipine, and labetalol. I am also having him maintain his mycophenolate, tacrolimus, predniSONE, sulfamethoxazole-trimethoprim, aspirin EC, ondansetron, metoCLOPramide, losartan, omeprazole, promethazine, and saccharomyces boulardii.   Meds ordered this encounter  Medications  . promethazine (PHENERGAN) 25 MG tablet    Sig: Take 25 mg by mouth every 6 (six) hours as needed for nausea or vomiting.  . saccharomyces boulardii (FLORASTOR) 250 MG capsule    Sig: Take 250 mg by mouth 2 (two) times daily.     Follow-up: Return in about 3 months (around 05/17/2017), or if symptoms worsen or fail to improve.  Mauricio Po, FNP

## 2017-02-17 NOTE — Assessment & Plan Note (Signed)
Difficulty swallowing pills and water on occasion with previous EGD showing no evidence of esophogeal stricture. Does not sound like aspiration. Consider referral to ENT. Patient will also discuss with GI.

## 2017-02-17 NOTE — Assessment & Plan Note (Signed)
Previously diagnosed with gastroparesis likely associated with Type 1 diabetes and is refractory to current dosage of Reglan. Refer to GI for further assessment and treatment. Recommend frequent small meals as tolerated.

## 2017-02-17 NOTE — Patient Instructions (Addendum)
Thank you for choosing [facility].  SUMMARY AND INSTRUCTIONS:  You will receive a call to follow up with gastroenterology.  Please schedule a time for your physical at your convenience.   Medication:  Please continue to take your medications as prescribed.  Your prescription(s) have been submitted to your pharmacy or been printed and provided for you. Please take as directed and contact our office if you believe you are having problem(s) with the medication(s) or have any questions.  Follow up:  If your symptoms worsen or fail to improve, please contact our office for further instruction, or in case of emergency go directly to the emergency room at the closest medical facility.

## 2017-02-17 NOTE — Assessment & Plan Note (Signed)
Status post renal transplant and pancreas transplant stable with Prograf and Myfortic and managed by transplant medicine and nephrology. No rejection symptoms. Continue to monitor with follow-up per nephrology and transplant medicine.

## 2017-02-20 ENCOUNTER — Encounter: Payer: Self-pay | Admitting: Physician Assistant

## 2017-03-01 ENCOUNTER — Other Ambulatory Visit: Payer: Self-pay | Admitting: Physician Assistant

## 2017-03-01 ENCOUNTER — Encounter: Payer: Self-pay | Admitting: Physician Assistant

## 2017-03-01 ENCOUNTER — Ambulatory Visit (INDEPENDENT_AMBULATORY_CARE_PROVIDER_SITE_OTHER): Payer: Medicare Other | Admitting: Physician Assistant

## 2017-03-01 VITALS — BP 110/68 | HR 72 | Ht 73.5 in | Wt 175.0 lb

## 2017-03-01 DIAGNOSIS — K2100 Gastro-esophageal reflux disease with esophagitis, without bleeding: Secondary | ICD-10-CM

## 2017-03-01 DIAGNOSIS — K5900 Constipation, unspecified: Secondary | ICD-10-CM

## 2017-03-01 DIAGNOSIS — R1310 Dysphagia, unspecified: Secondary | ICD-10-CM

## 2017-03-01 DIAGNOSIS — R112 Nausea with vomiting, unspecified: Secondary | ICD-10-CM

## 2017-03-01 MED ORDER — OMEPRAZOLE 40 MG PO CPDR: 40.0000 mg | DELAYED_RELEASE_CAPSULE | Freq: Every day | ORAL | 1 refills | Status: DC

## 2017-03-01 MED ORDER — METOCLOPRAMIDE HCL 10 MG PO TABS: 10.0000 mg | ORAL_TABLET | Freq: Four times a day (QID) | ORAL | 1 refills | Status: DC

## 2017-03-01 NOTE — Patient Instructions (Addendum)
If you are age 43 or older, your body mass index should be between 23-30. Your Body mass index is 22.78 kg/m. If this is out of the aforementioned range listed, please consider follow up with your Primary Care Provider.  If you are age 26 or younger, your body mass index should be between 19-25. Your Body mass index is 22.78 kg/m. If this is out of the aformentioned range listed, please consider follow up with your Primary Care Provider.   We have sent the following medications to your pharmacy for you to pick up at your convenience:  Omeprazole  Reglan  You have been given samples of Linzess. Call our office if this is helpful for a prescription.  You have been scheduled for a Barium Esophogram at [facility] (1st floor of the hospital) on Friday, March 16th at 11:00am. Please arrive 15 minutes prior to your appointment for registration. Make certain not to have anything to eat or drink 4 hours prior to your test. If you need to reschedule for any reason, please contact radiology at [phone] to do so. __________________________________________________________________ A barium swallow is an examination that concentrates on views of the esophagus. This tends to be a double contrast exam (barium and two liquids which, when combined, create a gas to distend the wall of the oesophagus) or single contrast (non-ionic iodine based). The study is usually tailored to your symptoms so a good history is essential. Attention is paid during the study to the form, structure and configuration of the esophagus, looking for functional disorders (such as aspiration, dysphagia, achalasia, motility and reflux) EXAMINATION You may be asked to change into a gown, depending on the type of swallow being performed. A radiologist and radiographer will perform the procedure. The radiologist will advise you of the type of contrast selected for your procedure and direct you during the exam. You will be asked to  stand, sit or lie in several different positions and to hold a small amount of fluid in your mouth before being asked to swallow while the imaging is performed .In some instances you may be asked to swallow barium coated marshmallows to assess the motility of a solid food bolus. The exam can be recorded as a digital or video fluoroscopy procedure. POST PROCEDURE It will take 1-2 days for the barium to pass through your system. To facilitate this, it is important, unless otherwise directed, to increase your fluids for the next 24-48hrs and to resume your normal diet.  This test typically takes about 30 minutes to perform. __________________________________________________________________________________ [name] have been scheduled for a gastric emptying scan at [facility] on Friday, March 23rd at 7:30am   . Please arrive at least 15 minutes prior to your appointment for registration. Please make certain not to have anything to eat or drink after midnight the night before your test. Hold all stomach medications (ex: Zofran, phenergan, Reglan) 48 hours prior to your test. If you need to reschedule your appointment, please contact radiology scheduling at [phone]. _____________________________________________________________________ A gastric-emptying study measures how long it takes for food to move through your stomach. There are several ways to measure stomach emptying. In the most common test, you eat food that contains a small amount of radioactive material. A scanner that detects the movement of the radioactive material is placed over your abdomen to monitor the rate at which food leaves your stomach. This test normally takes about 4 hours to complete. _____________________________________________________________________

## 2017-03-01 NOTE — Progress Notes (Addendum)
Chief Complaint: Nausea, Vomiting, Constipation, GERD, Dysphagia HPI:  [name] is a 41 year old African-American male with a past medical history of chronic pain, diabetes, depression, dialysis, ESRD, gastroparesis, gastropathy, GERD and hypertension as well as others listed below, who was referred to me by Golden Circle, FNP for a complaint of nausea, vomiting, constipation, GERD and dysphagia.     It appears patient has been assigned to Dr. Carlean Purl in the past, but has never been to our office previously. Patient was recently in the hospital for hematemesis and had an EGD 12/30/16 by Dr. Benson Norway this showed a hemorrhagic appearing mucosa in the esophagus, normal stomach, normal examined duodenum and there were no biopsies done.   Today, the patient presents to clinic accompanied by his wife who does assist with his history. Apparently in the past he has been told that he has gastroparesis but he has never had a gastric emptying study to diagnose this. 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 Patient does abide by gastroparesis diet and he only eats very small meals throughout the day and avoids raw fiber. Patient tells me today that the most significant symptom for him currently is that he is having trouble swallowing even his pills. He tells me he recently had an EGD and they told him nothing was wrong. He discusses that over the past 5-6 months he has symptoms daily where he feels like water and/or other solids get stuck in his throat on the way down. Occasionally these will come right back up and other times they will eventually go down. Currently the patient is on Reglan 10 mg twice a day, he has been on this since he was 41 years old per his recollection.   Patient also describes episodes of nausea and vomiting every 5-6 months, during these times he will sometimes have hematemesis. This is  what was happening when he was in the hospital recently. His wife believes this is from "retching" overtime and irritation of his throat. 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   He does complain of daily reflux symptoms regardless of his Omeprazole 20 mg daily. Apparently he has been on other combinations of medications in the past and nothing has really helped him. These included pantoprazole and Carafate.   Patient also describes constipation, he has tried MiraLAX on a daily basis before and this has not helped him. Patient tells me that he can be constipated for over a week or more and then can have a lot of liquid stools all at once. Patient has never tried anything else for his constipation, though this does make him quite bloated and uncomfortable.   Patient does admit to medicinal marijuana usage.   Patient denies fever, chills, blood in the stool, melena, weight loss or symptoms that awaken him at night.  Past Medical History:  Diagnosis Date  . 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 Arthritis    "I think I do; just in my fingers & my hands"  . Blood transfusion   . Chronic pain   . Depression   . Diabetes mellitus without complication ([place])    no since pancreas transplant  . Dialysis patient [facility]) 04/18/12   "[facility]; Tues, [place], [place]"  . Edema   . ESRD (end stage renal disease) on dialysis ([place])   . Gastroparesis   . Gastropathy   . GERD (  gastroesophageal reflux disease)   . Headache(784.0)   . Hypertension     Past Surgical History:  Procedure Laterality Date  . AV FISTULA PLACEMENT  08/2011   left upper arm  . BELOW KNEE LEG AMPUTATION  "it's been awhile"   bilaterally  . CATARACT EXTRACTION  ~ 2011   right  . COMBINED KIDNEY-PANCREAS TRANSPLANT    . ESOPHAGOGASTRODUODENOSCOPY N/A 12/30/2016   Procedure: ESOPHAGOGASTRODUODENOSCOPY (EGD);  Surgeon: Carol Ada, MD;  Location: [facility] ENDOSCOPY;  Service: Endoscopy;  Laterality: N/A;    Current Outpatient  Prescriptions  Medication Sig Dispense Refill  . aspirin EC 81 MG tablet Take 81 mg by mouth daily.    [name] losartan (COZAAR) 50 MG tablet Take 50 mg by mouth daily.     . metoCLOPramide (REGLAN) 10 MG tablet Take 1 tablet (10 mg total) by mouth every 6 (six) hours as needed for nausea. 30 tablet 0  . mycophenolate (MYFORTIC) 180 MG EC tablet Take 360 mg by mouth 2 (two) times daily.     [name] omeprazole (PRILOSEC) 20 MG capsule Take 1 capsule (20 mg total) by mouth daily as needed (heartburn). 30 capsule 0  . ondansetron (ZOFRAN ODT) 4 MG disintegrating tablet Take 1 tablet (4 mg total) by mouth every 8 (eight) hours as needed for nausea or vomiting. 20 tablet 0  . predniSONE (DELTASONE) 5 MG tablet Take 5 mg by mouth daily.     . promethazine (PHENERGAN) 25 MG tablet Take 25 mg by mouth every 6 (six) hours as needed for nausea or vomiting.    . saccharomyces boulardii (FLORASTOR) 250 MG capsule Take 250 mg by mouth 2 (two) times daily.    [name] sulfamethoxazole-trimethoprim (BACTRIM,SEPTRA) 400-80 MG per tablet Take 1 tablet by mouth every Monday, Wednesday, and Friday.     . tacrolimus (PROGRAF) 1 MG capsule Take 2-3 mg by mouth 2 (two) times daily. 3 mg in the morning, 2 mg in the evening    . metoCLOPramide (REGLAN) 10 MG tablet Take 1 tablet (10 mg total) by mouth 4 (four) times daily. Take 1tab 20-30 minutes before breakfast, lunch, dinner and at bedtime 120 tablet 1  . omeprazole (PRILOSEC) 40 MG capsule Take 1 capsule (40 mg total) by mouth daily. Take 1 cap 30-60 minutes before breakfast 90 capsule 1   No current facility-administered medications for this visit.     Allergies as of 03/01/2017 - Review Complete 03/01/2017  Allergen Reaction Noted  . Lisinopril  02/17/2017  . Amlodipine Rash 02/17/2017    Family History  Problem Relation Age of Onset  . Diabetes Other   . Hypertension Other   . Hypertension Mother   . Diabetes Mother   . Kidney disease Mother   . Diabetes Maternal  Grandmother   . Diabetes Paternal Grandmother     Social History   Social History  . Marital status: Married    Spouse name: N/A  . Number of children: 1  . Years of education: 93   Occupational History  . Disability    Social History Main Topics  . Smoking status: Current Some Day Smoker  . Smokeless tobacco: Never Used  . Alcohol use No  . Drug use: Yes    Frequency: 3.0 times per week    Types: Marijuana     Comment: Occasionally  . Sexual activity: Not Currently   Other Topics Concern  . Not on file   Social History Narrative   Fun: Restore old cars  Review of Systems:    Constitutional: No weight loss, fever or chills Skin: No rash Cardiovascular: No chest pain Respiratory: No SOB Gastrointestinal: See HPI and otherwise negative Genitourinary: No dysuria  Neurological: No headache Musculoskeletal: No new muscle or joint pain Hematologic: No bleeding Psychiatric: No history of depression or anxiety   Physical Exam:  Vital signs: BP 110/68   Pulse 72   Ht 6' 1.5" (1.867 m)   Wt 175 lb (79.4 kg)   BMI 22.78 kg/m   Constitutional:   Pleasant African American male appears to be in NAD, Well developed, Well nourished, alert and cooperative Head:  Normocephalic and atraumatic. Eyes:   PEERL, EOMI. No icterus. Conjunctiva pink. Ears:  Normal auditory acuity. Neck:  Supple Throat: Oral cavity and pharynx without inflammation, swelling or lesion.  Respiratory: Respirations even and unlabored. Lungs clear to auscultation bilaterally.   No wheezes, crackles, or rhonchi.  Cardiovascular: Normal S1, S2. No MRG. Regular rate and rhythm. No peripheral edema, cyanosis or pallor.  Gastrointestinal:  Soft, mild distension, generalized mild ttpNo rebound or guarding. Normal bowel sounds. No appreciable masses or hepatomegaly. Rectal:  Not performed.  Msk: b/l lower ext amputations below knee (able to ambulate on prosthesis) Neurologic:  Alert and  oriented x4;   grossly normal neurologically.  Skin:   Dry and intact without significant lesions or rashes. Psychiatric:  Demonstrates good judgement and reason without abnormal affect or behaviors.  MOST RECENT LABS AND IMAGING: CBC    Component Value Date/Time   WBC 6.3 01/02/2017 0815   RBC 4.90 01/02/2017 0815   HGB 15.0 01/02/2017 0815   HCT 46.3 01/02/2017 0815   PLT 183 01/02/2017 0815   MCV 94.5 01/02/2017 0815   MCH 30.6 01/02/2017 0815   MCHC 32.4 01/02/2017 0815   RDW 15.8 (H) 01/02/2017 0815   LYMPHSABS 0.8 12/16/2015 2250   MONOABS 0.9 12/16/2015 2250   EOSABS 0.1 12/16/2015 2250   BASOSABS 0.0 12/16/2015 2250    CMP     Component Value Date/Time   NA 140 01/02/2017 0815   K 3.7 01/02/2017 0815   CL 108 01/02/2017 0815   CO2 25 01/02/2017 0815   GLUCOSE 95 01/02/2017 0815   BUN 13 01/02/2017 0815   CREATININE 1.58 (H) 01/02/2017 0815   CREATININE 1.20 05/30/2014 1415   CALCIUM 8.6 (L) 01/02/2017 0815   CALCIUM 8.4 09/08/2011 0746   PROT 7.2 12/30/2016 0329   ALBUMIN 2.9 (L) 01/01/2017 0159   AST 19 12/30/2016 0329   ALT 15 (L) 12/30/2016 0329   ALKPHOS 77 12/30/2016 0329   BILITOT 0.7 12/30/2016 0329   GFRNONAA 52 (L) 01/02/2017 0815   GFRAA >60 01/02/2017 0815   EGD-Dr. Hung-12/30/16 Findings: Patchy mild mucosal changes characterized by hemorrhagic appearance were found in the lower third of the esophagus.  The stomach was normal. The examined duodenum was normal. Initial entry into the esophageal lumen was significant for blood. The Z-line was not well-visualized as the patient's tolerance was poor. In the distal esophagus the mucosa was abnormal in that blood oozed from the mucosa. There was no clear evidence of AVMs and no overt evidence of inflammation. Washing cleared the blood and then with continued observation, blood reaccumulated shortly. In the gastric lumen there was a large amount of fluid and it was suctioned, but no abnormalities were  noted. Impression: - Hemorrhagic appearing mucosa in the esophagus. - Normal stomach. - Normal examined duodenum. - No specimens collected.  Assessment: 1. Dysphagia: Patient  continues with trouble with swallowing, recent EGD showed no etiology for this, consider impaired motility versus other cause 2. Nausea and vomiting: Only occurs every 5-6 months, when this occurs he will have hematemesis after retching so much, consider relation to gastroparesis versus other 3. Constipation: Again, likely with diagnosis of gastroparesis, patient describes MiraLAX was of no help for him 4. GERD: Irregardless of Omeprazole 20 mg daily, patient has been on Pantoprazole and Carafate in the past with no relief, likely this is all due to gastroparesis  Plan: 1. Discussed with the patient that likely all the symptoms are from gastroparesis as this has been discussed before with him, but there has never been a clear diagnosis with gastric emptying study. Ordered a gastric emptying study today. 2. Ordered a barium swallow study with speech pathology evaluation for further evaluation of dysphagia symptoms which continue after EGD with no findings for etiology recently. 3. Reviewed anti-dysphagia measures. 4. Recommend the patient use his Reglan 10 mg 20-30 minutes before breakfast lunch and dinner and one at bedtime. Refilled this medication. 5. Increased patient's Omeprazole from 20 mg to 40 mg daily, 30-60 minutes before breakfast 6. Gave patient samples of Linzess 45mcg once daily. Recommend he give this a trial, if it works for him we can prescribe 7. We did review a gastroparesis diet, handout was given 8. Patient to follow in clinic with Dr. Carlean Purl or myself in the next 3-4 weeks. We can then discuss everything further.  Ellouise Newer, PA-C [facility] 03/01/2017, 10:52 AM  Cc: Golden Circle, FNP   Agree with Ms. Lemmon's evaluation and management.  Had oozing of blood from  esophageal mucosa in Jan 2018 EGD - mucosa looked ok  Depending upon what is seen w/ studies ordered would consider repeating an EGD as he is immunosupressed and at increase risk of Candida, HSV and CMV  Gatha Mayer, MD, [facility]

## 2017-03-09 ENCOUNTER — Ambulatory Visit (HOSPITAL_COMMUNITY)
Admission: RE | Admit: 2017-03-09 | Discharge: 2017-03-09 | Disposition: A | Discharge status: Home / Self Care | Payer: Medicare Other | Source: Ambulatory Visit | Attending: Physician Assistant | Admitting: Physician Assistant

## 2017-03-09 DIAGNOSIS — R112 Nausea with vomiting, unspecified: Secondary | ICD-10-CM | POA: Insufficient documentation

## 2017-03-09 DIAGNOSIS — R1310 Dysphagia, unspecified: Secondary | ICD-10-CM | POA: Insufficient documentation

## 2017-03-10 ENCOUNTER — Ambulatory Visit (HOSPITAL_COMMUNITY): Payer: No Typology Code available for payment source

## 2017-03-16 ENCOUNTER — Ambulatory Visit (AMBULATORY_SURGERY_CENTER): Payer: Self-pay

## 2017-03-16 VITALS — Ht 73.5 in | Wt 169.4 lb

## 2017-03-16 DIAGNOSIS — R1310 Dysphagia, unspecified: Secondary | ICD-10-CM

## 2017-03-16 DIAGNOSIS — R1319 Other dysphagia: Secondary | ICD-10-CM

## 2017-03-16 NOTE — Progress Notes (Signed)
Denies allergies to eggs or soy products. Denies complication of anesthesia or sedation. Denies use of weight loss medication. Denies use of O2.   Emmi instructions declined.  

## 2017-03-17 ENCOUNTER — Ambulatory Visit (HOSPITAL_COMMUNITY): Admission: RE | Admit: 2017-03-17 | Payer: Medicare Other | Source: Ambulatory Visit

## 2017-03-20 ENCOUNTER — Encounter: Payer: Medicare Other | Admitting: Internal Medicine

## 2017-03-22 ENCOUNTER — Ambulatory Visit (AMBULATORY_SURGERY_CENTER): Payer: Medicare Other | Admitting: Internal Medicine

## 2017-03-22 ENCOUNTER — Encounter: Payer: Self-pay | Admitting: Internal Medicine

## 2017-03-22 VITALS — BP 163/93 | HR 78 | Temp 97.3°F | Resp 13 | Ht 73.0 in | Wt 169.0 lb

## 2017-03-22 DIAGNOSIS — R1319 Other dysphagia: Secondary | ICD-10-CM

## 2017-03-22 MED ORDER — SODIUM CHLORIDE 0.9 % IV SOLN: 500.0000 mL | INTRAVENOUS | Status: DC

## 2017-03-22 NOTE — Progress Notes (Signed)
No problems noted in the recovery room. maw 

## 2017-03-22 NOTE — Op Note (Signed)
[place] Patient Name: [name] Procedure Date: 03/22/2017 9:15 AM MRN: [id number] Endoscopist: Gatha Mayer , MD Age: 40 Referring MD:  Date of Birth: July 17, 1976 Gender: Male Account #: [id number] Procedure:                Upper GI endoscopy Indications:              Dysphagia Medicines:                Propofol per Anesthesia, Monitored Anesthesia Care Procedure:                Pre-Anesthesia Assessment:                           - Prior to the procedure, a History and Physical                            was performed, and patient medications and                            allergies were reviewed. The patient's tolerance of                            previous anesthesia was also reviewed. The risks                            and benefits of the procedure and the sedation                            options and risks were discussed with the patient.                            All questions were answered, and informed consent                            was obtained. Prior Anticoagulants: The patient                            last took aspirin 1 day prior to the procedure. ASA                            Grade Assessment: III - A patient with severe                            systemic disease. After reviewing the risks and                            benefits, the patient was deemed in satisfactory                            condition to undergo the procedure.                           After obtaining informed consent, the endoscope was  passed under direct vision. Throughout the                            procedure, the patient's blood pressure, pulse, and                            oxygen saturations were monitored continuously. The                            Endoscope was introduced through the mouth, and                            advanced to the second part of duodenum. The upper                            GI endoscopy was  accomplished without difficulty.                            The patient tolerated the procedure well. Scope In: Scope Out: Findings:                 No endoscopic abnormality was evident in the                            esophagus to explain the patient's complaint of                            dysphagia. It was decided, however, to proceed with                            dilation of the entire esophagus. The scope was                            withdrawn. Dilation was performed with a Maloney                            dilator with mild resistance at 46 Fr. The dilation                            site was examined following endoscope reinsertion                            and showed no change. Estimated blood loss: none.                           A medium amount of food (residue) was found in the                            gastric body.                           The exam was otherwise without abnormality.  The cardia and gastric fundus were normal on                            retroflexion. Complications:            No immediate complications. Estimated Blood Loss:     Estimated blood loss: none. Impression:               - No endoscopic esophageal abnormality to explain                            patient's dysphagia. Esophagus dilated. Dilated.                           - A medium amount of food (residue) in the stomach.                           - The examination was otherwise normal.                           - No specimens collected. Recommendation:           - Patient has a contact number available for                            emergencies. The signs and symptoms of potential                            delayed complications were discussed with the                            patient. Return to normal activities tomorrow.                            Written discharge instructions were provided to the                            patient.                            - Clear liquids x 1 hour then soft foods rest of                            day. Start prior diet tomorrow.                           - Continue present medications.                           - Return to my office as previously scheduled. Gatha Mayer, MD 03/22/2017 9:35:25 AM This report has been signed electronically.

## 2017-03-22 NOTE — Progress Notes (Signed)
Patient awakening,vss,report to rn 

## 2017-03-22 NOTE — Patient Instructions (Addendum)
The esophagus looked ok. I dilated it to see if you can swallow better. There was some food sitting in the stomach which is consistent with gastroparesis.  Please keep your appointment on 4/12 and we will regroup.  I appreciate the opportunity to care for you. Gatha Mayer, MD, FACG      YOU HAD AN ENDOSCOPIC PROCEDURE TODAY AT [facility]:   Refer to the procedure report that was given to you for any specific questions about what was found during the examination.  If the procedure report does not answer your questions, please call your gastroenterologist to clarify.  If you requested that your care partner not be given the details of your procedure findings, then the procedure report has been included in a sealed envelope for you to review at your convenience later.  YOU SHOULD EXPECT: Some feelings of bloating in the abdomen. Passage of more gas than usual.  Walking can help get rid of the air that was put into your GI tract during the procedure and reduce the bloating. If you had a lower endoscopy (such as a colonoscopy or flexible sigmoidoscopy) you may notice spotting of blood in your stool or on the toilet paper. If you underwent a bowel prep for your procedure, you may not have a normal bowel movement for a few days.  Please Note:  You might notice some irritation and congestion in your nose or some drainage.  This is from the oxygen used during your procedure.  There is no need for concern and it should clear up in a day or so.  SYMPTOMS TO REPORT IMMEDIATELY:    Following upper endoscopy (EGD)  Vomiting of blood or coffee ground material  New chest pain or pain under the shoulder blades  Painful or persistently difficult swallowing  New shortness of breath  Fever of 100F or higher  Black, tarry-looking stools  For urgent or emergent issues, a gastroenterologist can be reached at any hour by calling [phone].   DIET:  Please follow the  dilatation diet the rest of today.  Handout was provided.  Drink plenty of fluids but you should avoid alcoholic beverages for 24 hours.  ACTIVITY:  You should plan to take it easy for the rest of today and you should NOT DRIVE or use heavy machinery until tomorrow (because of the sedation medicines used during the test).    FOLLOW UP: Our staff will call the number listed on your records the next business day following your procedure to check on you and address any questions or concerns that you may have regarding the information given to you following your procedure. If we do not reach you, we will leave a message.  However, if you are feeling well and you are not experiencing any problems, there is no need to return our call.  We will assume that you have returned to your regular daily activities without incident.  If any biopsies were taken you will be contacted by phone or by letter within the next 1-3 weeks.  Please call us at [phone] if you have not heard about the biopsies in 3 weeks.    SIGNATURES/CONFIDENTIALITY: You and/or your care partner have signed paperwork which will be entered into your electronic medical record.  These signatures attest to the fact that that the information above on your After Visit Summary has been reviewed and is understood.  Full responsibility of the confidentiality of this discharge information lies with you  and/or your care-partner.   Handouts were given to your care partner on the dilatation diet to follow the rest of today. You may resume your current medications today. Return to my office for follow up as previously scheduled. Please call if any questions or concerns.

## 2017-03-22 NOTE — Progress Notes (Signed)
Pt's states no medical or surgical changes since previsit or office visit. 

## 2017-03-23 ENCOUNTER — Telehealth: Payer: Self-pay | Admitting: *Deleted

## 2017-03-23 NOTE — Telephone Encounter (Signed)
  Follow up Call-  Call back number 03/22/2017  Post procedure Call Back phone  # [phone]  Permission to leave phone message Yes  Some recent data might be hidden     Patient questions:  Do you have a fever, pain , or abdominal swelling? No. Pain Score  0 *  Have you tolerated food without any problems? Yes.    Have you been able to return to your normal activities? Yes.    Do you have any questions about your discharge instructions: Diet   No. Medications  No. Follow up visit  No.  Do you have questions or concerns about your Care? No.  Actions: * If pain score is 4 or above: No action needed, pain <4.

## 2017-04-03 ENCOUNTER — Ambulatory Visit (HOSPITAL_COMMUNITY)
Admission: EM | Admit: 2017-04-03 | Discharge: 2017-04-03 | Disposition: A | Discharge status: Home / Self Care | Payer: Medicare Other | Attending: Internal Medicine | Admitting: Internal Medicine

## 2017-04-03 ENCOUNTER — Encounter (HOSPITAL_COMMUNITY): Payer: Self-pay | Admitting: Emergency Medicine

## 2017-04-03 DIAGNOSIS — Z23 Encounter for immunization: Secondary | ICD-10-CM | POA: Diagnosis not present

## 2017-04-03 DIAGNOSIS — L089 Local infection of the skin and subcutaneous tissue, unspecified: Secondary | ICD-10-CM | POA: Diagnosis present

## 2017-04-03 DIAGNOSIS — T148XXA Other injury of unspecified body region, initial encounter: Secondary | ICD-10-CM

## 2017-04-03 DIAGNOSIS — S6992XA Unspecified injury of left wrist, hand and finger(s), initial encounter: Secondary | ICD-10-CM

## 2017-04-03 MED ORDER — CEPHALEXIN 500 MG PO CAPS: 500.0000 mg | ORAL_CAPSULE | Freq: Four times a day (QID) | ORAL | 0 refills | Status: DC

## 2017-04-03 MED ORDER — LIDOCAINE HCL 2 % IJ SOLN
INTRAMUSCULAR | Status: AC
  Filled 2017-04-03: qty 20

## 2017-04-03 MED ORDER — TETANUS-DIPHTH-ACELL PERTUSSIS 5-2.5-18.5 LF-MCG/0.5 IM SUSP
0.5000 mL | Freq: Once | INTRAMUSCULAR | Status: AC
  Administered 2017-04-03: 0.5 mL via INTRAMUSCULAR

## 2017-04-03 MED ORDER — TETANUS-DIPHTH-ACELL PERTUSSIS 5-2.5-18.5 LF-MCG/0.5 IM SUSP
INTRAMUSCULAR | Status: AC
  Filled 2017-04-03: qty 0.5

## 2017-04-03 NOTE — ED Triage Notes (Signed)
The patient presented to the [facility] with a complaint of wound to his left hand that occurred 4 months ago.

## 2017-04-03 NOTE — Discharge Instructions (Signed)
Soak the wound in warm salt water 2 or 3 times a day. Try to express any pus from the wound for the next day or 2. Apply a dressing for any drainage or bleeding. If this is worse she will need to see your doctor as scheduled this week or may return. Continue taking the Bactrim and will add Keflex for the possibility of a different type of bacterial infection.
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
## 2017-04-06 ENCOUNTER — Ambulatory Visit (INDEPENDENT_AMBULATORY_CARE_PROVIDER_SITE_OTHER): Payer: Medicare Other | Admitting: Internal Medicine

## 2017-04-06 ENCOUNTER — Encounter: Payer: Self-pay | Admitting: Internal Medicine

## 2017-04-06 VITALS — BP 118/78 | HR 84 | Ht 71.0 in | Wt 175.5 lb

## 2017-04-06 DIAGNOSIS — K3184 Gastroparesis: Secondary | ICD-10-CM | POA: Diagnosis not present

## 2017-04-06 DIAGNOSIS — E1143 Type 2 diabetes mellitus with diabetic autonomic (poly)neuropathy: Secondary | ICD-10-CM

## 2017-04-06 DIAGNOSIS — R1310 Dysphagia, unspecified: Secondary | ICD-10-CM

## 2017-04-06 DIAGNOSIS — R1319 Other dysphagia: Secondary | ICD-10-CM

## 2017-04-06 LAB — AEROBIC CULTURE  (SUPERFICIAL SPECIMEN)

## 2017-04-06 LAB — AEROBIC CULTURE W GRAM STAIN (SUPERFICIAL SPECIMEN)

## 2017-04-06 NOTE — Progress Notes (Signed)
   [name] 41 y.o. December 04, 1976 [id number]  Assessment & Plan:   Encounter Diagnoses  Name Primary?  . Esophageal dysphagia Yes  . Diabetic gastroparesis [facility])     He has responded very well to the esophageal dilation that was done empirically in March. No more dysphagia. He will continue his current medications. He does not seem to be symptomatic from his gastroparesis at this time he is managing that with metoclopramide. A gastroparesis diet was given to the patient and his wife today for some education about the type of foods that will work better in that situation.  He will see me as needed I appreciate the opportunity to care for this patient. CC: Andrew Po, FNP   Subjective:   Chief Complaint: Follow-up of dysphagia after esophageal dilation, gastroparesis  HPI Patient is here with his wife. He underwent EGD with esophageal dilation, he had some retained food in the stomach, an empiric esophageal dilation was done due to intermittent solid food dysphagia problems. He has not had that problem since I dilated him is very pleased. He is not being having any nausea or vomiting problems with his gastroparesis lately either.  Medications, allergies, past medical history, past surgical history, family history and social history are reviewed and updated in the EMR.  Review of Systems As above  Objective:   Physical Exam BP 118/78 (BP Location: Left Arm, Patient Position: Sitting, Cuff Size: Normal)   Pulse 84   Ht 5\' 11"  (1.803 m) Comment: height measured without shoes  Wt 175 lb 8 oz (79.6 kg)   BMI 24.48 kg/m  No acute distress  15 minutes time spent with patient > half in counseling coordination of care

## 2017-04-06 NOTE — Patient Instructions (Signed)
   Glad things are going well.  I would not make any changes in what you are doing with medications at this time.  Review the gastroparesis diet information and see if you can make any changes that will help.  Please call back or see me as needed.  I appreciate the opportunity to care for you. Gatha Mayer, MD, [facility]

## 2017-04-10 ENCOUNTER — Ambulatory Visit: Payer: Medicare Other | Admitting: Family

## 2017-05-14 ENCOUNTER — Emergency Department (HOSPITAL_COMMUNITY)
Admission: EM | Admit: 2017-05-14 | Discharge: 2017-05-14 | Discharge status: Against Medical Advice | Payer: Medicare Other | Attending: Emergency Medicine | Admitting: Emergency Medicine

## 2017-05-14 ENCOUNTER — Encounter (HOSPITAL_COMMUNITY): Payer: Self-pay | Admitting: Emergency Medicine

## 2017-05-14 DIAGNOSIS — L02512 Cutaneous abscess of left hand: Secondary | ICD-10-CM | POA: Insufficient documentation

## 2017-05-14 DIAGNOSIS — N186 End stage renal disease: Secondary | ICD-10-CM | POA: Diagnosis not present

## 2017-05-14 DIAGNOSIS — Z7982 Long term (current) use of aspirin: Secondary | ICD-10-CM | POA: Diagnosis not present

## 2017-05-14 DIAGNOSIS — F17200 Nicotine dependence, unspecified, uncomplicated: Secondary | ICD-10-CM | POA: Insufficient documentation

## 2017-05-14 DIAGNOSIS — Z79899 Other long term (current) drug therapy: Secondary | ICD-10-CM | POA: Diagnosis not present

## 2017-05-14 DIAGNOSIS — E1022 Type 1 diabetes mellitus with diabetic chronic kidney disease: Secondary | ICD-10-CM | POA: Insufficient documentation

## 2017-05-14 DIAGNOSIS — I120 Hypertensive chronic kidney disease with stage 5 chronic kidney disease or end stage renal disease: Secondary | ICD-10-CM | POA: Diagnosis not present

## 2017-05-14 LAB — CBC WITH DIFFERENTIAL/PLATELET
BASOS ABS: 0 10*3/uL (ref 0.0–0.1)
Basophils Relative: 1 %
Eosinophils Absolute: 0.2 10*3/uL (ref 0.0–0.7)
Eosinophils Relative: 3 %
HEMATOCRIT: 54.8 % — AB (ref 39.0–52.0)
HEMOGLOBIN: 17.5 g/dL — AB (ref 13.0–17.0)
LYMPHS ABS: 1.1 10*3/uL (ref 0.7–4.0)
LYMPHS PCT: 13 %
MCH: 30 pg (ref 26.0–34.0)
MCHC: 31.9 g/dL (ref 30.0–36.0)
MCV: 93.8 fL (ref 78.0–100.0)
Monocytes Absolute: 1.3 10*3/uL — ABNORMAL HIGH (ref 0.1–1.0)
Monocytes Relative: 16 %
NEUTROS ABS: 5.7 10*3/uL (ref 1.7–7.7)
NEUTROS PCT: 67 %
Platelets: 230 10*3/uL (ref 150–400)
RBC: 5.84 MIL/uL — ABNORMAL HIGH (ref 4.22–5.81)
RDW: 15.2 % (ref 11.5–15.5)
WBC: 8.3 10*3/uL (ref 4.0–10.5)

## 2017-05-14 LAB — I-STAT CHEM 8, ED
BUN: 16 mg/dL (ref 6–20)
CALCIUM ION: 1.19 mmol/L (ref 1.15–1.40)
CHLORIDE: 102 mmol/L (ref 101–111)
CREATININE: 1.5 mg/dL — AB (ref 0.61–1.24)
Glucose, Bld: 80 mg/dL (ref 65–99)
HCT: 62 % — ABNORMAL HIGH (ref 39.0–52.0)
Hemoglobin: 21.1 g/dL (ref 13.0–17.0)
Potassium: 3.7 mmol/L (ref 3.5–5.1)
Sodium: 140 mmol/L (ref 135–145)
TCO2: 28 mmol/L (ref 0–100)

## 2017-05-14 MED ORDER — VANCOMYCIN HCL IN DEXTROSE 750-5 MG/150ML-% IV SOLN: 750.0000 mg | INTRAVENOUS | Status: DC

## 2017-05-14 MED ORDER — VANCOMYCIN HCL 10 G IV SOLR
1250.0000 mg | Freq: Once | INTRAVENOUS | Status: DC
  Filled 2017-05-14: qty 1250

## 2017-05-14 MED ORDER — DOXYCYCLINE HYCLATE 100 MG PO CAPS: 100.0000 mg | ORAL_CAPSULE | Freq: Two times a day (BID) | ORAL | 0 refills | Status: DC

## 2017-05-14 MED ORDER — HYDROMORPHONE HCL 1 MG/ML IJ SOLN: 0.5000 mg | Freq: Once | INTRAMUSCULAR | Status: DC

## 2017-05-14 MED ORDER — SODIUM CHLORIDE 0.9 % IV SOLN
1500.0000 mg | Freq: Once | INTRAVENOUS | Status: DC
  Filled 2017-05-14: qty 1500

## 2017-05-14 MED ORDER — LIDOCAINE HCL (PF) 1 % IJ SOLN
5.0000 mL | Freq: Once | INTRAMUSCULAR | Status: AC
  Administered 2017-05-14: 5 mL
  Filled 2017-05-14: qty 5
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
## 2017-05-14 NOTE — ED Triage Notes (Signed)
Order place for IV team consult, because of difficult IV access.

## 2017-05-14 NOTE — Progress Notes (Signed)
Pharmacy Antibiotic Note  [name] is a 41 y.o. male admitted on 05/14/2017 with left hand drainage.  Patient is on antibiotic PTA but he doesn't recall the name.  Pharmacy has been consulted for vancomycin dosing for cellulitis.  Patient has ESRD on TTS dialysis.  Labs pending.   Plan: - Vanc 1500mg  IV x 1, then 750mg  IV q-HD TTS - Monitor HD schedule/tolerance, clinical progress, vanc level at Css   Height: 6' (182.9 cm) Weight: 172 lb (78 kg) IBW/kg (Calculated) : 77.6  Temp (24hrs), Avg:98.7 F (37.1 C), Min:98.7 F (37.1 C), Max:98.7 F (37.1 C)  No results for input(s): WBC, CREATININE, LATICACIDVEN, VANCOTROUGH, VANCOPEAK, VANCORANDOM, GENTTROUGH, GENTPEAK, GENTRANDOM, TOBRATROUGH, TOBRAPEAK, TOBRARND, AMIKACINPEAK, AMIKACINTROU, AMIKACIN in the last 168 hours.  CrCl cannot be calculated (Patient's most recent lab result is older than the maximum 21 days allowed.).    Allergies  Allergen Reactions  . Lisinopril     Cough  . Amlodipine Rash      Vanc 5/20 >>  5/20 left hand wound cx -     Willard Madrigal D. Mina Marble, PharmD, BCPS Pager:  [phone] 05/14/2017, 12:53 PM

## 2017-05-14 NOTE — ED Triage Notes (Signed)
Pt. Stated, I had left hand drained  And on an antibiotic and now the oozing and pain is back, came back several days ago. Pain all over my hand.

## 2017-05-14 NOTE — Discharge Instructions (Signed)
You are leaving against our advice. Return in time for further treatment. Return if you have worsening fevers chills or pain in the hand. Follow-up with hand surgery.

## 2017-05-14 NOTE — ED Triage Notes (Signed)
EDP reported refusing treatment and sign out AMA. Pt did not receive  IV meds.

## 2017-05-17 LAB — AEROBIC CULTURE W GRAM STAIN (SUPERFICIAL SPECIMEN): Culture: NORMAL

## 2017-05-17 LAB — AEROBIC CULTURE  (SUPERFICIAL SPECIMEN)

## 2017-06-26 ENCOUNTER — Telehealth: Payer: Self-pay | Admitting: *Deleted

## 2017-06-26 NOTE — Telephone Encounter (Signed)
Wife left msg on triage stating husband is needing to get a rx for prothesis for his legs faxed to [facility] @ [phone]...[name]

## 2017-06-26 NOTE — Telephone Encounter (Signed)
What kind of prosthesis is he needing or is there paperwork from [facility]?

## 2017-06-26 NOTE — Telephone Encounter (Signed)
Called pt he states they told him to have his MD to send rx/order for " Suspension Sleeves and Supplies", and they will submit to insurance...[name]

## 2017-06-27 NOTE — Telephone Encounter (Signed)
Prescription written to be faxed.

## 2017-06-27 NOTE — Telephone Encounter (Signed)
Faxed script to [facility] @ [phone]...[name]

## 2017-07-17 ENCOUNTER — Inpatient Hospital Stay (HOSPITAL_COMMUNITY)
Admission: EM | Admit: 2017-07-17 | Discharge: 2017-07-20 | DRG: 378 | Disposition: A | Discharge status: Home / Self Care | Payer: Medicare Other | Attending: Internal Medicine | Admitting: Internal Medicine

## 2017-07-17 ENCOUNTER — Inpatient Hospital Stay (HOSPITAL_COMMUNITY): Payer: Medicare Other

## 2017-07-17 ENCOUNTER — Encounter (HOSPITAL_COMMUNITY): Payer: Self-pay | Admitting: *Deleted

## 2017-07-17 DIAGNOSIS — Z7952 Long term (current) use of systemic steroids: Secondary | ICD-10-CM | POA: Diagnosis not present

## 2017-07-17 DIAGNOSIS — Z89511 Acquired absence of right leg below knee: Secondary | ICD-10-CM | POA: Diagnosis not present

## 2017-07-17 DIAGNOSIS — K219 Gastro-esophageal reflux disease without esophagitis: Secondary | ICD-10-CM | POA: Diagnosis present

## 2017-07-17 DIAGNOSIS — Z89512 Acquired absence of left leg below knee: Secondary | ICD-10-CM | POA: Diagnosis not present

## 2017-07-17 DIAGNOSIS — I10 Essential (primary) hypertension: Secondary | ICD-10-CM | POA: Diagnosis present

## 2017-07-17 DIAGNOSIS — Z833 Family history of diabetes mellitus: Secondary | ICD-10-CM | POA: Diagnosis not present

## 2017-07-17 DIAGNOSIS — K920 Hematemesis: Secondary | ICD-10-CM | POA: Diagnosis not present

## 2017-07-17 DIAGNOSIS — Z7982 Long term (current) use of aspirin: Secondary | ICD-10-CM | POA: Diagnosis not present

## 2017-07-17 DIAGNOSIS — R1084 Generalized abdominal pain: Secondary | ICD-10-CM | POA: Diagnosis not present

## 2017-07-17 DIAGNOSIS — Z9483 Pancreas transplant status: Secondary | ICD-10-CM | POA: Diagnosis not present

## 2017-07-17 DIAGNOSIS — R109 Unspecified abdominal pain: Secondary | ICD-10-CM

## 2017-07-17 DIAGNOSIS — Z940 Kidney transplant status: Secondary | ICD-10-CM | POA: Diagnosis not present

## 2017-07-17 DIAGNOSIS — G43A0 Cyclical vomiting, not intractable: Secondary | ICD-10-CM | POA: Diagnosis present

## 2017-07-17 DIAGNOSIS — E1043 Type 1 diabetes mellitus with diabetic autonomic (poly)neuropathy: Secondary | ICD-10-CM | POA: Diagnosis present

## 2017-07-17 DIAGNOSIS — K3184 Gastroparesis: Secondary | ICD-10-CM | POA: Diagnosis not present

## 2017-07-17 DIAGNOSIS — K922 Gastrointestinal hemorrhage, unspecified: Secondary | ICD-10-CM | POA: Diagnosis present

## 2017-07-17 DIAGNOSIS — Z79899 Other long term (current) drug therapy: Secondary | ICD-10-CM | POA: Diagnosis not present

## 2017-07-17 DIAGNOSIS — Z8249 Family history of ischemic heart disease and other diseases of the circulatory system: Secondary | ICD-10-CM

## 2017-07-17 DIAGNOSIS — G894 Chronic pain syndrome: Secondary | ICD-10-CM | POA: Diagnosis present

## 2017-07-17 LAB — CBC
HCT: 55.9 % — ABNORMAL HIGH (ref 39.0–52.0)
HEMATOCRIT: 51.4 % (ref 39.0–52.0)
HEMOGLOBIN: 18.2 g/dL — AB (ref 13.0–17.0)
Hemoglobin: 16.8 g/dL (ref 13.0–17.0)
MCH: 30.3 pg (ref 26.0–34.0)
MCH: 30.2 pg (ref 26.0–34.0)
MCHC: 32.6 g/dL (ref 30.0–36.0)
MCHC: 32.7 g/dL (ref 30.0–36.0)
MCV: 93 fL (ref 78.0–100.0)
MCV: 92.3 fL (ref 78.0–100.0)
PLATELETS: 242 10*3/uL (ref 150–400)
Platelets: 231 10*3/uL (ref 150–400)
RBC: 6.01 MIL/uL — ABNORMAL HIGH (ref 4.22–5.81)
RBC: 5.57 MIL/uL (ref 4.22–5.81)
RDW: 16.1 % — ABNORMAL HIGH (ref 11.5–15.5)
RDW: 16.1 % — AB (ref 11.5–15.5)
WBC: 7.4 10*3/uL (ref 4.0–10.5)
WBC: 6.9 10*3/uL (ref 4.0–10.5)

## 2017-07-17 LAB — CBG MONITORING, ED: Glucose-Capillary: 110 mg/dL — ABNORMAL HIGH (ref 65–99)

## 2017-07-17 LAB — TYPE AND SCREEN
ABO/RH(D): O POS
ANTIBODY SCREEN: NEGATIVE

## 2017-07-17 LAB — COMPREHENSIVE METABOLIC PANEL
ALK PHOS: 97 U/L (ref 38–126)
ALT: 18 U/L (ref 17–63)
ANION GAP: 11 (ref 5–15)
AST: 19 U/L (ref 15–41)
Albumin: 4.2 g/dL (ref 3.5–5.0)
BILIRUBIN TOTAL: 0.9 mg/dL (ref 0.3–1.2)
BUN: 15 mg/dL (ref 6–20)
CALCIUM: 9.8 mg/dL (ref 8.9–10.3)
CO2: 24 mmol/L (ref 22–32)
Chloride: 105 mmol/L (ref 101–111)
Creatinine, Ser: 1.63 mg/dL — ABNORMAL HIGH (ref 0.61–1.24)
GFR, EST AFRICAN AMERICAN: 59 mL/min — AB (ref >60)
GFR, EST NON AFRICAN AMERICAN: 50 mL/min — AB (ref >60)
Glucose, Bld: 105 mg/dL — ABNORMAL HIGH (ref 65–99)
Potassium: 4.2 mmol/L (ref 3.5–5.1)
Sodium: 140 mmol/L (ref 135–145)
TOTAL PROTEIN: 7.8 g/dL (ref 6.5–8.1)

## 2017-07-17 LAB — I-STAT TROPONIN, ED: TROPONIN I, POC: 0 ng/mL (ref 0.00–0.08)

## 2017-07-17 MED ORDER — HYDRALAZINE HCL 20 MG/ML IJ SOLN
10.0000 mg | INTRAMUSCULAR | Status: DC | PRN
  Administered 2017-07-17 – 2017-07-18 (×2): 10 mg via INTRAVENOUS
  Filled 2017-07-17 (×2): qty 1

## 2017-07-17 MED ORDER — MORPHINE SULFATE (PF) 4 MG/ML IV SOLN
4.0000 mg | Freq: Once | INTRAVENOUS | Status: AC
  Administered 2017-07-17: 4 mg via INTRAVENOUS
  Filled 2017-07-17: qty 1

## 2017-07-17 MED ORDER — ONDANSETRON HCL 4 MG PO TABS: 4.0000 mg | ORAL_TABLET | Freq: Four times a day (QID) | ORAL | Status: DC | PRN

## 2017-07-17 MED ORDER — PANTOPRAZOLE SODIUM 40 MG IV SOLR
40.0000 mg | Freq: Two times a day (BID) | INTRAVENOUS | Status: DC
  Filled 2017-07-17: qty 40

## 2017-07-17 MED ORDER — TACROLIMUS 1 MG PO CAPS
2.0000 mg | ORAL_CAPSULE | Freq: Two times a day (BID) | ORAL | Status: DC
Start: 2017-07-17 — End: 2017-07-17

## 2017-07-17 MED ORDER — ACETAMINOPHEN 650 MG RE SUPP: 650.0000 mg | Freq: Four times a day (QID) | RECTAL | Status: DC | PRN

## 2017-07-17 MED ORDER — SODIUM CHLORIDE 0.9 % IV SOLN
8.0000 mg/h | INTRAVENOUS | Status: DC
  Administered 2017-07-17: 8 mg/h via INTRAVENOUS
  Filled 2017-07-17 (×3): qty 80

## 2017-07-17 MED ORDER — LABETALOL HCL 5 MG/ML IV SOLN
20.0000 mg | Freq: Once | INTRAVENOUS | Status: AC
  Administered 2017-07-17: 20 mg via INTRAVENOUS
  Filled 2017-07-17: qty 4

## 2017-07-17 MED ORDER — ONDANSETRON HCL 4 MG/2ML IJ SOLN
4.0000 mg | Freq: Once | INTRAMUSCULAR | Status: AC
  Administered 2017-07-17: 4 mg via INTRAVENOUS
  Filled 2017-07-17: qty 2

## 2017-07-17 MED ORDER — ACETAMINOPHEN 325 MG PO TABS: 650.0000 mg | ORAL_TABLET | Freq: Four times a day (QID) | ORAL | Status: DC | PRN

## 2017-07-17 MED ORDER — TACROLIMUS 1 MG PO CAPS
2.0000 mg | ORAL_CAPSULE | Freq: Every evening | ORAL | Status: DC
  Administered 2017-07-17 – 2017-07-19 (×3): 2 mg via ORAL
  Filled 2017-07-17 (×4): qty 2

## 2017-07-17 MED ORDER — PANTOPRAZOLE SODIUM 40 MG IV SOLR
80.0000 mg | Freq: Once | INTRAVENOUS | Status: AC
  Administered 2017-07-17: 80 mg via INTRAVENOUS
  Filled 2017-07-17: qty 80

## 2017-07-17 MED ORDER — TACROLIMUS 1 MG PO CAPS
3.0000 mg | ORAL_CAPSULE | ORAL | Status: DC
  Filled 2017-07-17: qty 3

## 2017-07-17 MED ORDER — METHYLPREDNISOLONE SODIUM SUCC 40 MG IJ SOLR
40.0000 mg | Freq: Every day | INTRAMUSCULAR | Status: DC
  Administered 2017-07-17 – 2017-07-19 (×3): 40 mg via INTRAVENOUS
  Filled 2017-07-17 (×3): qty 1

## 2017-07-17 MED ORDER — ONDANSETRON HCL 4 MG/2ML IJ SOLN
4.0000 mg | Freq: Four times a day (QID) | INTRAMUSCULAR | Status: DC | PRN
  Administered 2017-07-17 – 2017-07-18 (×2): 4 mg via INTRAVENOUS
  Filled 2017-07-17 (×2): qty 2

## 2017-07-17 MED ORDER — MORPHINE SULFATE (PF) 4 MG/ML IV SOLN
2.0000 mg | INTRAVENOUS | Status: DC | PRN
Start: 2017-07-17 — End: 2017-07-20
  Administered 2017-07-17 – 2017-07-18 (×3): 2 mg via INTRAVENOUS
  Filled 2017-07-17 (×3): qty 1

## 2017-07-17 MED ORDER — SODIUM CHLORIDE 0.9 % IV SOLN
INTRAVENOUS | Status: DC
  Administered 2017-07-17: 1000 mL via INTRAVENOUS
  Administered 2017-07-19: 04:00:00 via INTRAVENOUS

## 2017-07-17 MED ORDER — MYCOPHENOLATE SODIUM 180 MG PO TBEC
360.0000 mg | DELAYED_RELEASE_TABLET | Freq: Two times a day (BID) | ORAL | Status: DC
  Administered 2017-07-17 – 2017-07-20 (×4): 360 mg via ORAL
  Filled 2017-07-17 (×7): qty 2

## 2017-07-17 MED ORDER — SODIUM CHLORIDE 0.9 % IV SOLN
80.0000 mg | Freq: Once | INTRAVENOUS | Status: AC
  Administered 2017-07-17: 80 mg via INTRAVENOUS
  Filled 2017-07-17: qty 80

## 2017-07-17 MED ORDER — PROMETHAZINE HCL 25 MG/ML IJ SOLN
12.5000 mg | Freq: Once | INTRAMUSCULAR | Status: AC
  Administered 2017-07-17: 12.5 mg via INTRAVENOUS
  Filled 2017-07-17: qty 1

## 2017-07-17 NOTE — ED Triage Notes (Signed)
Pt had kidney pancreas transplant 4 years ago and is here with active hematemesis and diaphoretic. Pt with vomiting and abdominal pain since this am.  Martin Majestic to his physician and he sent him here via car.  Pt is alert.  IV access obtained and blood drawn.

## 2017-07-17 NOTE — H&P (Signed)
History and Physical    [name] WIO:[id number] DOB: [birth year] DOA: 07/17/2017  PCP: Golden Circle, FNP  Patient coming from: Home.  Chief Complaint: Hematemesis.  HPI: [name] is a 41 y.o. male with history of gastroparesis, renal and pancreas transplant, chronic pain, hypertension presents to the ER with complaints of persistent nausea vomiting since this morning. Patient eventually started having bloody vomitus. Also has been having lower abdominal discomfort.   ED Course: In the ER patient had 2-3 episodes of hematemesis. ER physician had discussed with on-call gastroenterologist Dr. Fuller Plan will be seeing patient in consult. Patient is restarted on Protonix. Patient blood pressures also markedly elevated.  Review of Systems: As per HPI, rest all negative.   Past Medical History:  Diagnosis Date  . 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 Arthritis    "I think I do; just in my fingers & my hands"  . Blood transfusion   . Cataract   . Chronic pain   . Depression    Patient states he has never been depressed.  . Diabetes mellitus without complication ([place])    no since pancreas transplant  . Dialysis patient [facility]) 04/18/12   "[facility] T[place], [place], [place]"  . Edema   . ESRD (end stage renal disease) on dialysis ([place])   . Gastroparesis   . Gastropathy   . GERD (gastroesophageal reflux disease)   . Headache(784.0)   . Hypertension     Past Surgical History:  Procedure Laterality Date  . AV FISTULA PLACEMENT  08/2011   left upper arm  . BELOW KNEE LEG AMPUTATION  "it's been awhile"   bilaterally  . CATARACT EXTRACTION  ~ 2011   right  . COMBINED KIDNEY-PANCREAS TRANSPLANT    . ESOPHAGOGASTRODUODENOSCOPY N/A 12/30/2016   Procedure: ESOPHAGOGASTRODUODENOSCOPY (EGD);  Surgeon: Carol Ada, MD;  Location: [facility] ENDOSCOPY;  Service: Endoscopy;  Laterality: N/A;     reports that he has been smoking.  He has never used smokeless  tobacco. He reports that he uses drugs, including Marijuana, about 3 times per week. He reports that he does not drink alcohol.  Allergies  Allergen Reactions  . Lisinopril     Cough  . Amlodipine Rash    Family History  Problem Relation Age of Onset  . Diabetes Other   . Hypertension Other   . Hypertension Mother   . Diabetes Mother   . Kidney disease Mother   . Diabetes Maternal Grandmother   . Diabetes Paternal Grandmother   . Colon cancer Neg Hx   . Esophageal cancer Neg Hx   . Rectal cancer Neg Hx   . Stomach cancer Neg Hx     Prior to Admission medications   Medication Sig Start Date End Date Taking? Authorizing Provider  aspirin EC 81 MG tablet Take 81 mg by mouth daily.   Yes [provider]  losartan (COZAAR) 100 MG tablet Take 100 mg by mouth daily.  09/08/16  Yes [provider]  metoCLOPramide (REGLAN) 10 MG tablet Take 1 tablet (10 mg total) by mouth 4 (four) times daily. Take 1tab 20-30 minutes before breakfast, lunch, dinner and at bedtime 03/01/17  Yes Levin Erp, [place]  mycophenolate (MYFORTIC) 180 MG EC tablet Take 360 mg by mouth 2 (two) times daily.  07/06/13  Yes [provider]  omeprazole (PRILOSEC) 40 MG capsule Take 1 capsule (40 mg total) by mouth daily. Take 1 cap 30-60 minutes before breakfast 03/01/17  Yes Levin Erp, PA  ondansetron (ZOFRAN ODT) 4 MG disintegrating tablet Take 1 tablet (4 mg total) by mouth every 8 (eight) hours as needed for nausea or vomiting. 01/08/15  Yes Gareth Morgan, MD  oxyCODONE (ROXICODONE) 15 MG immediate release tablet Take 15 mg by mouth every 6 (six) hours as needed for pain.  03/17/17  Yes [provider]  predniSONE (DELTASONE) 5 MG tablet Take 5 mg by mouth daily.  06/20/13  Yes [provider]  promethazine (PHENERGAN) 25 MG tablet Take 25 mg by mouth every 6 (six) hours as needed for nausea or vomiting.   Yes [provider]  saccharomyces boulardii  (FLORASTOR) 250 MG capsule Take 250 mg by mouth 2 (two) times daily.   Yes [provider]  sulfamethoxazole-trimethoprim (BACTRIM,SEPTRA) 400-80 MG per tablet Take 1 tablet by mouth every Monday, Wednesday, and Friday.    Yes [provider]  tacrolimus (PROGRAF) 1 MG capsule Take 2-3 mg by mouth 2 (two) times daily. 3 mg in the morning, 2 mg in the evening 07/16/13  Yes [provider]  XTAMPZA ER 9 MG C12A Take 9 mg by mouth every 12 (twelve) hours. 06/19/17  Yes [provider]  doxycycline (VIBRAMYCIN) 100 MG capsule Take 1 capsule (100 mg total) by mouth 2 (two) times daily. Patient not taking: Reported on 07/17/2017 05/14/17   Davonna Belling, MD    Physical Exam: Vitals:   07/17/17 1915 07/17/17 1930 07/17/17 1945 07/17/17 2000  BP: (!) 185/88 (!) 184/94 (!) 204/92 (!) 178/81  Pulse: 87 90 90 90  Resp: (!) 21 (!) 21 16 20   Temp:      TempSrc:      SpO2: 100% 100% 100% 100%      Constitutional: Moderately built and nourished. Vitals:   07/17/17 1915 07/17/17 1930 07/17/17 1945 07/17/17 2000  BP: (!) 185/88 (!) 184/94 (!) 204/92 (!) 178/81  Pulse: 87 90 90 90  Resp: (!) 21 (!) 21 16 20   Temp:      TempSrc:      SpO2: 100% 100% 100% 100%   Eyes: Anicteric. No pallor. ENMT: No discharge from the ears eyes nose and mouth. Neck: No mass felt. No neck rigidity. Respiratory: No rhonchi or crepitations. Cardiovascular: S1 and S2 heard no murmurs appreciated. Abdomen: Soft nontender bowel sounds present. Musculoskeletal: Below-knee amputation. Skin: No rash. Neurologic: Alert awake oriented to time place and person. Moves all extremities. Psychiatric: Appears normal. Normal affect.   Labs on Admission: I have personally reviewed following labs and imaging studies  CBC:  Recent Labs Lab 07/17/17 1716  WBC 7.4  HGB 18.2*  HCT 55.9*  MCV 93.0  PLT 419   Basic Metabolic Panel:  Recent Labs Lab 07/17/17 1716  NA 140  K 4.2  CL  105  CO2 24  GLUCOSE 105*  BUN 15  CREATININE 1.63*  CALCIUM 9.8   GFR: CrCl cannot be calculated (Unknown ideal weight.). Liver Function Tests:  Recent Labs Lab 07/17/17 1716  AST 19  ALT 18  ALKPHOS 97  BILITOT 0.9  PROT 7.8  ALBUMIN 4.2   No results for input(s): LIPASE, AMYLASE in the last 168 hours. No results for input(s): AMMONIA in the last 168 hours. Coagulation Profile: No results for input(s): INR, PROTIME in the last 168 hours. Cardiac Enzymes: No results for input(s): CKTOTAL, CKMB, CKMBINDEX, TROPONINI in the last 168 hours. BNP (last 3 results) No results for input(s): PROBNP in the last 8760  hours. HbA1C: No results for input(s): HGBA1C in the last 72 hours. CBG:  Recent Labs Lab 07/17/17 1650  GLUCAP 110*   Lipid Profile: No results for input(s): CHOL, HDL, LDLCALC, TRIG, CHOLHDL, LDLDIRECT in the last 72 hours. Thyroid Function Tests: No results for input(s): TSH, T4TOTAL, FREET4, T3FREE, THYROIDAB in the last 72 hours. Anemia Panel: No results for input(s): VITAMINB12, FOLATE, FERRITIN, TIBC, IRON, RETICCTPCT in the last 72 hours. Urine analysis:    Component Value Date/Time   COLORURINE AMBER (A) 10/11/2016 2328   APPEARANCEUR CLEAR 10/11/2016 2328   LABSPEC 1.026 10/11/2016 2328   PHURINE 6.0 10/11/2016 2328   GLUCOSEU NEGATIVE 10/11/2016 2328   HGBUR NEGATIVE 10/11/2016 2328   HGBUR negative 10/08/2010 0853   BILIRUBINUR MODERATE (A) 10/11/2016 2328   BILIRUBINUR SMALL 12/14/2015 1139   KETONESUR NEGATIVE 10/11/2016 2328   PROTEINUR 30 (A) 10/11/2016 2328   UROBILINOGEN 1.0 12/14/2015 1139   UROBILINOGEN 0.2 09/13/2015 0051   NITRITE NEGATIVE 10/11/2016 2328   LEUKOCYTESUR NEGATIVE 10/11/2016 2328   Sepsis Labs: @LABRCNTIP (procalcitonin:4,lacticidven:4) )No results found for this or any previous visit (from the past 240 hour(s)).   Radiological Exams on Admission: No results found.  EKG: Independently reviewed. Normal sinus  rhythm.  Assessment/Plan Principal Problem:   Acute GI bleeding Active Problems:   S/P bilateral BKA (below knee amputation) ([place])   Renal transplant, status post   History of pancreas transplant ([place])   Diabetic gastroparesis associated with type 1 diabetes mellitus ([place])    1. Acute GI bleeding - Dr. Fuller Plan of GIs been consulted. Patient is placed on Protonix infusion and follow CBC serially. Patient will be kept nothing by mouth except medications in anticipation of procedure. 2. Hypertension uncontrolled - patient is placed on when necessary IV hydralazine. 3. History of renal and pancreatic transplant - continue immunosuppressants and I have placed patient on stress dose steroids. 4. Nausea vomiting likely from gastroparesis. X-ray findings pending.  I have reviewed patient's old charts and labs.   DVT prophylaxis: SCDs. Code Status: Full code.  Family Communication: Discussed with patient.  Disposition Plan: Home.  Consults called: Copywriter, advertising by ER physician.  Admission status: Inpatient.    Rise Patience MD [facility] Pager [phone].  If 7PM-7AM, please contact night-coverage [email or web address] Password [facility]  07/17/2017, 8:17 PM

## 2017-07-17 NOTE — ED Provider Notes (Addendum)
[facility] DEPT Provider Note   CSN: [id number] Arrival date & time: 07/17/17  1638     History   Chief Complaint Chief Complaint  Patient presents with  . GI Bleeding  . Hematemesis    HPI [name] is a 41 y.o. male.Complaining of vomiting blood onset this afternoon. Has vomited approximate 10 times. Last bowel movement was yesterday which was normal. He denies fever. He complains of diffuse abdominal pain. Nothing makes symptoms better or worse. No shortness of breath. No other associated symptoms. No treatment prior to coming here he was sent here today from his pain management doctor  HPI  Past Medical History:  Diagnosis Date  . 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 Arthritis    "I think I do; just in my fingers & my hands"  . Blood transfusion   . Cataract   . Chronic pain   . Depression    Patient states he has never been depressed.  . Diabetes mellitus without complication ([place])    no since pancreas transplant  . Dialysis patient [facility]) 04/18/12   "[facility]; Tues, [place], [place]"  . Edema   . ESRD (end stage renal disease) on dialysis ([place])   . Gastroparesis   . Gastropathy   . GERD (gastroesophageal reflux disease)   . Headache(784.0)   . Hypertension     Patient Active Problem List   Diagnosis Date Noted  . Healthcare maintenance 08/26/2016  . Back pain 12/15/2015  . History of diabetes mellitus 02/20/2015  . Weakness of left upper extremity 07/03/2014  . Impaired mobility and activities of daily living 07/03/2014  . Sternoclavicular joint pain 03/04/2014  . Renal transplant, status post 07/19/2013  . History of pancreas transplant ([place]) 07/19/2013  . Immunosuppressed status ([place]) 06/27/2013  . H/O pancreas transplant ([place]) 06/27/2013  . Diabetic gastroparesis associated with type 1 diabetes mellitus ([place]) 05/08/2013  . BKA stump complication ([place]) 95/62/1308  . S/P bilateral BKA (below knee amputation) ([place])  08/17/2012  . Metabolic bone disease 65/78/4696  . ESRD (end stage renal disease) ([place]) 11/17/2009  . GERD 03/14/2007  . HLD (hyperlipidemia) 02/22/2007  . Major depressive disorder, recurrent episode ([place]) 02/22/2007  . POST TRAUMATIC STRESS DISORDER 02/22/2007  . 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 AV FISTULA PLACEMENT  08/2011   left upper arm  . BELOW KNEE LEG AMPUTATION  "it's been awhile"   bilaterally  . CATARACT EXTRACTION  ~ 2011   right  . COMBINED KIDNEY-PANCREAS TRANSPLANT    . ESOPHAGOGASTRODUODENOSCOPY N/A 12/30/2016   Procedure: ESOPHAGOGASTRODUODENOSCOPY (EGD);  Surgeon: Carol Ada, MD;  Location[facility] ENDOSCOPY;  Service: Endoscopy;  Laterality: N/A;       Home Medications    Prior to Admission medications   Medication Sig Start Date End Date Taking? Authorizing Provider  aspirin EC 81 MG tablet Take 81 mg by mouth daily.    [provider]  cephALEXin (KEFLEX) 500 MG capsule Take 1 capsule (500 mg total) by mouth 4 (four) times daily. 04/03/17   Janne Napoleon, NP  doxycycline (VIBRAMYCIN) 100 MG capsule Take 1 capsule (100 mg total) by mouth 2 (two) times daily. 05/14/17   Davonna Belling, MD  losartan (COZAAR) 50 MG tablet Take 50 mg by mouth daily.  09/08/16   [provider]  metoCLOPramide (REGLAN) 10 MG tablet Take 1 tablet (10 mg total) by mouth 4 (four) times daily. Take 1tab 20-30  minutes before breakfast, lunch, dinner and at bedtime 03/01/17   Levin Erp, [place]  mycophenolate (MYFORTIC) 180 MG EC tablet Take 360 mg by mouth 2 (two) times daily.  07/06/13   [provider]  omeprazole (PRILOSEC) 40 MG capsule Take 1 capsule (40 mg total) by mouth daily. Take 1 cap 30-60 minutes before breakfast 03/01/17   Levin Erp, PA  ondansetron (ZOFRAN ODT) 4 MG disintegrating tablet Take 1 tablet (4 mg total) by mouth every 8 (eight) hours as needed for nausea or vomiting.  01/08/15   Gareth Morgan, MD  oxyCODONE (ROXICODONE) 15 MG immediate release tablet  03/17/17   [provider]  predniSONE (DELTASONE) 5 MG tablet Take 5 mg by mouth daily.  06/20/13   [provider]  promethazine (PHENERGAN) 25 MG tablet Take 25 mg by mouth every 6 (six) hours as needed for nausea or vomiting.    [provider]  saccharomyces boulardii (FLORASTOR) 250 MG capsule Take 250 mg by mouth 2 (two) times daily.    [provider]  sulfamethoxazole-trimethoprim (BACTRIM,SEPTRA) 400-80 MG per tablet Take 1 tablet by mouth every Monday, Wednesday, and Friday.     [provider]  tacrolimus (PROGRAF) 1 MG capsule Take 2-3 mg by mouth 2 (two) times daily. 3 mg in the morning, 2 mg in the evening 07/16/13   [provider]    Family History Family History  Problem Relation Age of Onset  . Diabetes Other   . Hypertension Other   . Hypertension Mother   . Diabetes Mother   . Kidney disease Mother   . Diabetes Maternal Grandmother   . Diabetes Paternal Grandmother   . Colon cancer Neg Hx   . Esophageal cancer Neg Hx   . Rectal cancer Neg Hx   . Stomach cancer Neg Hx     Social History Social History  Substance Use Topics  . Smoking status: Current Some Day Smoker  . Smokeless tobacco: Never Used  . Alcohol use No     Allergies   Lisinopril and Amlodipine   Review of Systems Review of Systems  Gastrointestinal: Positive for abdominal pain and nausea.       Hematemesis  Musculoskeletal:       Bilateral BKA     Physical Exam Updated Vital Signs BP (!) 244/139 (BP Location: Right Arm)   Pulse (!) 102   Resp (!) 22   SpO2 100%   Physical Exam  Constitutional:  Ill-appearing  HENT:  Head: Normocephalic and atraumatic.  Eyes: Pupils are equal, round, and reactive to light. Conjunctivae are normal.  Neck: Neck supple. No tracheal deviation present. No thyromegaly present.  Cardiovascular: Normal rate and  regular rhythm.   No murmur heard. Pulmonary/Chest: Effort normal and breath sounds normal.  Abdominal: Soft. He exhibits no distension. There is no tenderness.  Diminished bowel sounds  Genitourinary: Penis normal.  Musculoskeletal: Normal range of motion. He exhibits no edema or tenderness.  Neurological: He is alert. Coordination normal.  Skin: Skin is dry. No rash noted.  Grossly diaphoretic  Psychiatric: He has a normal mood and affect.  Nursing note and vitals reviewed.    ED Treatments / Results  Labs (all labs ordered are listed, but only abnormal results are displayed) Labs Reviewed  CBG MONITORING, ED - Abnormal; Notable for the following:       Result Value   Glucose-Capillary 110 (*)    All other components within normal limits  COMPREHENSIVE METABOLIC  PANEL  CBC  I-STAT TROPONIN, ED  POC OCCULT BLOOD, ED  TYPE AND SCREEN   Results for orders placed or performed during the hospital encounter of 07/17/17  Comprehensive metabolic panel  Result Value Ref Range   Sodium 140 135 - 145 mmol/L   Potassium 4.2 3.5 - 5.1 mmol/L   Chloride 105 101 - 111 mmol/L   CO2 24 22 - 32 mmol/L   Glucose, Bld 105 (H) 65 - 99 mg/dL   BUN 15 6 - 20 mg/dL   Creatinine, Ser 1.63 (H) 0.61 - 1.24 mg/dL   Calcium 9.8 8.9 - 10.3 mg/dL   Total Protein 7.8 6.5 - 8.1 g/dL   Albumin 4.2 3.5 - 5.0 g/dL   AST 19 15 - 41 U/L   ALT 18 17 - 63 U/L   Alkaline Phosphatase 97 38 - 126 U/L   Total Bilirubin 0.9 0.3 - 1.2 mg/dL   GFR calc non Af Amer 50 (L) >60 mL/min   GFR calc Af Amer 59 (L) >60 mL/min   Anion gap 11 5 - 15  CBC  Result Value Ref Range   WBC 7.4 4.0 - 10.5 K/uL   RBC 6.01 (H) 4.22 - 5.81 MIL/uL   Hemoglobin 18.2 (H) 13.0 - 17.0 g/dL   HCT 55.9 (H) 39.0 - 52.0 %   MCV 93.0 78.0 - 100.0 fL   MCH 30.3 26.0 - 34.0 pg   MCHC 32.6 30.0 - 36.0 g/dL   RDW 16.1 (H) 11.5 - 15.5 %   Platelets 231 150 - 400 K/uL  CBG monitoring, ED  Result Value Ref Range   Glucose-Capillary 110  (H) 65 - 99 mg/dL  I-stat troponin, ED  Result Value Ref Range   Troponin i, poc 0.00 0.00 - 0.08 ng/mL   Comment 3          Type and screen [place]  Result Value Ref Range   ABO/RH(D) O POS    Antibody Screen NEG    Sample Expiration 07/20/2017    No results found. Patient vomited gross blood wall here. Noted to be markedly hypertensive. Intravenous labetalol, IV Protonix ordered as well as IV ZofranEKG  EKG Interpretation  Date/Time:  Monday July 17 2017 17:33:40 EDT Ventricular Rate:  79 PR Interval:    QRS Duration: 101 QT Interval:  392 QTC Calculation: 450 R Axis:   -77 Text Interpretation:  Sinus rhythm Probable left atrial enlargement Left anterior fascicular block Anterior infarct, old Baseline wander in lead(s) V3 Since last tracing rate slower Confirmed by Orlie Dakin [phone]) on 07/17/2017 5:43:47 PM       Radiology No results found.  Procedures Procedures (including critical care time)  Medications Ordered in ED Medications  pantoprazole (PROTONIX) 80 mg in sodium chloride 0.9 % 100 mL IVPB (not administered)  ondansetron (ZOFRAN) injection 4 mg (4 mg Intravenous Given 07/17/17 1730)  labetalol (NORMODYNE,TRANDATE) injection 20 mg (20 mg Intravenous Given 07/17/17 1730)     Initial Impression / Assessment and Plan / ED Course  I have reviewed the triage vital signs and the nursing notes.  Pertinent labs & imaging results that were available during my care of the patient were reviewed by me and considered in my medical decision making (see chart for details).     5:35 PM reports no relief after treatment with IV Zofran. IV Phenergan ordered. At 7:10 PM patient reports pain nausea not improved after treatment with Phenergan. He remains hypertensive. I've ordered  intravenous morphine as well as additional intravenous labetalol. I've consulted Dr. Fuller Plan from gastroenterology who will see patient tomorrow in consultation. I've also  consulted Dr.Kakrakandy from hospital service who will arrange for admission. I suggest stepdown unit. Renal insufficiency is chronic  Final Clinical Impressions(s) / ED Diagnoses  Diagnosis #1 acute upper GI bleed Final diagnoses:  None   #2 hypertension New Prescriptions New Prescriptions   No medications on file    CRITICAL CARE Performed by: Orlie Dakin Total critical care time: 30 minutes Critical care time was exclusive of separately billable procedures and treating other patients. Critical care was necessary to treat or prevent imminent or life-threatening deterioration. Critical care was time spent personally by me on the following activities: development of treatment plan with patient and/or surrogate as well as nursing, discussions with consultants, evaluation of patient's response to treatment, examination of patient, obtaining history from patient or surrogate, ordering and performing treatments and interventions, ordering and review of laboratory studies, ordering and review of radiographic studies, pulse oximetry and re-evaluation of patient's condition. Orlie Dakin, MD 07/17/17 Jennet Maduro, MD 07/17/17 [phone]

## 2017-07-17 NOTE — ED Notes (Signed)
Dr. Raliegh Ip at bedside for admission

## 2017-07-18 ENCOUNTER — Encounter (HOSPITAL_COMMUNITY): Payer: Self-pay

## 2017-07-18 DIAGNOSIS — R1084 Generalized abdominal pain: Secondary | ICD-10-CM

## 2017-07-18 DIAGNOSIS — K920 Hematemesis: Principal | ICD-10-CM

## 2017-07-18 LAB — CBC
HCT: 50.7 % (ref 39.0–52.0)
HCT: 52.3 % — ABNORMAL HIGH (ref 39.0–52.0)
HEMOGLOBIN: 16.5 g/dL (ref 13.0–17.0)
Hemoglobin: 17 g/dL (ref 13.0–17.0)
MCH: 30.2 pg (ref 26.0–34.0)
MCH: 30.5 pg (ref 26.0–34.0)
MCHC: 32.5 g/dL (ref 30.0–36.0)
MCHC: 32.5 g/dL (ref 30.0–36.0)
MCV: 93.9 fL (ref 78.0–100.0)
MCV: 92.9 fL (ref 78.0–100.0)
PLATELETS: 209 10*3/uL (ref 150–400)
PLATELETS: 243 10*3/uL (ref 150–400)
RBC: 5.46 MIL/uL (ref 4.22–5.81)
RBC: 5.57 MIL/uL (ref 4.22–5.81)
RDW: 16 % — AB (ref 11.5–15.5)
RDW: 16.4 % — AB (ref 11.5–15.5)
WBC: 6.1 10*3/uL (ref 4.0–10.5)
WBC: 6.7 10*3/uL (ref 4.0–10.5)

## 2017-07-18 LAB — RAPID URINE DRUG SCREEN, HOSP PERFORMED
Amphetamines: NOT DETECTED
BARBITURATES: NOT DETECTED
BENZODIAZEPINES: NOT DETECTED
Cocaine: NOT DETECTED
Opiates: POSITIVE — AB
Tetrahydrocannabinol: POSITIVE — AB

## 2017-07-18 LAB — MRSA PCR SCREENING: MRSA BY PCR: NEGATIVE

## 2017-07-18 LAB — ETHANOL

## 2017-07-18 LAB — HIV ANTIBODY (ROUTINE TESTING W REFLEX): HIV SCREEN 4TH GENERATION: NONREACTIVE

## 2017-07-18 MED ORDER — SODIUM CHLORIDE 0.9 % IV SOLN
12.5000 mg | Freq: Once | INTRAVENOUS | Status: AC
  Administered 2017-07-18: 12.5 mg via INTRAVENOUS
  Filled 2017-07-18 (×2): qty 0.5

## 2017-07-18 MED ORDER — ONDANSETRON HCL 4 MG/2ML IJ SOLN
INTRAMUSCULAR | Status: AC
Start: 2017-07-18 — End: 2017-07-18
  Filled 2017-07-18: qty 2

## 2017-07-18 MED ORDER — TACROLIMUS 1 MG PO CAPS
3.0000 mg | ORAL_CAPSULE | ORAL | Status: DC
  Filled 2017-07-18 (×3): qty 3

## 2017-07-18 MED ORDER — ONDANSETRON HCL 4 MG/2ML IJ SOLN
4.0000 mg | Freq: Four times a day (QID) | INTRAMUSCULAR | Status: DC
  Administered 2017-07-18 – 2017-07-19 (×3): 4 mg via INTRAVENOUS
  Filled 2017-07-18 (×4): qty 2

## 2017-07-18 MED ORDER — METOPROLOL TARTRATE 5 MG/5ML IV SOLN
5.0000 mg | Freq: Four times a day (QID) | INTRAVENOUS | Status: DC | PRN
  Filled 2017-07-18: qty 5

## 2017-07-18 MED ORDER — METOCLOPRAMIDE HCL 5 MG/ML IJ SOLN
10.0000 mg | Freq: Four times a day (QID) | INTRAMUSCULAR | Status: DC
  Administered 2017-07-18 – 2017-07-20 (×3): 10 mg via INTRAVENOUS
  Filled 2017-07-18 (×4): qty 2

## 2017-07-18 MED ORDER — METOCLOPRAMIDE HCL 5 MG/ML IJ SOLN: 10.0000 mg | Freq: Four times a day (QID) | INTRAMUSCULAR | Status: DC

## 2017-07-18 MED ORDER — ONDANSETRON HCL 4 MG PO TABS
4.0000 mg | ORAL_TABLET | Freq: Four times a day (QID) | ORAL | Status: DC
  Filled 2017-07-18: qty 1

## 2017-07-18 MED ORDER — ONDANSETRON HCL 4 MG/2ML IJ SOLN: 4.0000 mg | Freq: Four times a day (QID) | INTRAMUSCULAR | Status: DC

## 2017-07-18 MED ORDER — ONDANSETRON HCL 4 MG PO TABS: 4.0000 mg | ORAL_TABLET | Freq: Four times a day (QID) | ORAL | Status: DC

## 2017-07-18 NOTE — Progress Notes (Signed)
Patient is refusing to have blood sugar checked via finger stick. Patient educated and continues to refuse.

## 2017-07-18 NOTE — Progress Notes (Signed)
Patient refused to have labs drawn this morning. He stated, "I'm tired of being stuck". Refused finger stick for CBG as well.

## 2017-07-18 NOTE — Care Management Note (Signed)
Case Management Note  Patient Details  Name: [name] MRN: [id number] Date of Birth: 1976/01/13  Subjective/Objective:  From home with wife, who is a Therapist, sports, patient is a bil BKA, uses prothesis. He presents with GIB, hx of ESRD, severe HTN,  he has PCP and medication coverage.  Per GI, this is exacerbation of his gastroparesis .                      Action/Plan: NCM will follow for dc needs.   Expected Discharge Date:  07/20/17               Expected Discharge Plan:     In-House Referral:     Discharge planning Services  CM Consult  Post Acute Care Choice:    Choice offered to:     DME Arranged:    DME Agency:     HH Arranged:    HH Agency:     Status of Service:  In process, will continue to follow  If discussed at Long Length of Stay Meetings, dates discussed:    Additional Comments:  Zenon Mayo, RN 07/18/2017, 4:50 PM

## 2017-07-18 NOTE — Progress Notes (Signed)
PROGRESS NOTE    [name]  OFB:[id number] DOB: 02-01-1976 DOA: 07/17/2017 PCP: Golden Circle, FNP   Brief Narrative:  41 y.o. BM PMHx Depression, DM Gastroparesis, S/P Renal and Pancreas transplant, Chronic pain syndrome, HTN, BKA, Drug abuse (marijuana)  Presents to the ER with complaints of persistent nausea vomiting since this morning. Patient eventually started having bloody vomitus. Also has been having lower abdominal discomfort.   ED Course: In the ER patient had 2-3 episodes of hematemesis. ER physician had discussed with on-call gastroenterologist Dr. Fuller Plan will be seeing patient in consult. Patient is restarted on Protonix. Patient blood pressures also markedly elevated.   Subjective: 7/24 A/O 4, negative CP, negative SOB, negative N/V, negative abdominal pain. This has improved since admission. States had [facility] this month, and CellCept/Prograf levels were WNL. States renal/pancreatic transplant 2014 at [facility].     Assessment & Plan:   Principal Problem:   Acute GI bleeding Active Problems:   S/P bilateral BKA (below knee amputation) ([place])   Renal transplant, status post   History of pancreas transplant ([place])   Diabetic gastroparesis associated with type 1 diabetes mellitus ([place])  Acute GI bleed -GI recommendations no need for EGD -Resolution N/V will start patient on clear liquid diet.  Essential HTN -Losartan 100 mg daily -Metoprolol PRN  Hx Renal and Pancreatic transplant -Elevated CellCept/Prograf levels could cause HTN, GI bleed. -CellCept and Prograf levels pending -Solu-Medrol 40 mg daily (stress dose)  Nausea/vomiting/abdominal pain/ DM Gastroparesis -Reglan 10 mg QID -Zofran QID  -Thorazine PRN -Hemoglobin A1c pending -Lipid panel pending      DVT prophylaxis: SCD Code Status: Full Family Communication: None Disposition Plan: TBD   Consultants:  GI Dr. Fuller Plan   Procedures/Significant  Events:  7/24 DG Abdomen: Nonobstructed bowel gas pattern     VENTILATOR SETTINGS:    Cultures   Antimicrobials: Anti-infectives    None       Devices    LINES / TUBES:      Continuous Infusions: . sodium chloride 1,000 mL (07/17/17 2059)  . chlorproMAZINE (THORAZINE) IV    . pantoprozole (PROTONIX) infusion 8 mg/hr (07/17/17 2113)     Objective: Vitals:   07/18/17 0400 07/18/17 0500 07/18/17 0600 07/18/17 0721  BP: (!) 155/80 (!) 153/83 131/71 (!) 186/92  Pulse: 94 93 92 96  Resp: (!) 24 (!) 21 (!) 21 (!) 26  Temp:    99.6 F (37.6 C)  TempSrc:    Oral  SpO2: 99% 100% 97% 96%  Weight:      Height:        Intake/Output Summary (Last 24 hours) at 07/18/17 0800 Last data filed at 07/18/17 0600  Gross per 24 hour  Intake           770.41 ml  Output              750 ml  Net            20.41 ml   Filed Weights   07/17/17 2055 07/18/17 0248  Weight: 148 lb 2.4 oz (67.2 kg) 151 lb 7.3 oz (68.7 kg)    Examination:  General: A/O 4, No acute respiratory distress Eyes: negative scleral hemorrhage, negative anisocoria, negative icterus ENT: Negative Runny nose, negative gingival bleeding, Neck:  Negative scars, masses, torticollis, lymphadenopathy, JVD Lungs: Clear to auscultation bilaterally without wheezes or crackles Cardiovascular: Tachycardic, Regular rhythm without murmur gallop or rub normal S1 and S2 Abdomen: negative abdominal  pain, nondistended, positive soft, bowel sounds, no rebound, no ascites, no appreciable mass, midline abdominal incision well-healed. Extremities: No significant cyanosis, clubbing. Bilateral lower extremity BKA, Skin: Negative rashes, lesions, ulcers Psychiatric:  Negative depression, negative anxiety, negative fatigue, negative mania  Central nervous system:  Cranial nerves II through XII intact, tongue/uvula midline, all extremities muscle strength 5/5, sensation intact throughout,negative dysarthria, negative expressive  aphasia, negative receptive aphasia.  .     Data Reviewed: Care during the described time interval was provided by me .  I have reviewed this patient's available data, including medical history, events of note, physical examination, and all test results as part of my evaluation. I have personally reviewed and interpreted all radiology studies.  CBC:  Recent Labs Lab 07/17/17 1716 07/17/17 2119 07/18/17 0013 07/18/17 0422  WBC 7.4 6.9 6.7 6.1  HGB 18.2* 16.8 16.5 17.0  HCT 55.9* 51.4 50.7 52.3*  MCV 93.0 92.3 92.9 93.9  PLT 231 242 243 998   Basic Metabolic Panel:  Recent Labs Lab 07/17/17 1716  NA 140  K 4.2  CL 105  CO2 24  GLUCOSE 105*  BUN 15  CREATININE 1.63*  CALCIUM 9.8   GFR: Estimated Creatinine Clearance: 57.4 mL/min (A) (by C-G formula based on SCr of 1.63 mg/dL (H)). Liver Function Tests:  Recent Labs Lab 07/17/17 1716  AST 19  ALT 18  ALKPHOS 97  BILITOT 0.9  PROT 7.8  ALBUMIN 4.2   No results for input(s): LIPASE, AMYLASE in the last 168 hours. No results for input(s): AMMONIA in the last 168 hours. Coagulation Profile: No results for input(s): INR, PROTIME in the last 168 hours. Cardiac Enzymes: No results for input(s): CKTOTAL, CKMB, CKMBINDEX, TROPONINI in the last 168 hours. BNP (last 3 results) No results for input(s): PROBNP in the last 8760 hours. HbA1C: No results for input(s): HGBA1C in the last 72 hours. CBG:  Recent Labs Lab 07/17/17 1650  GLUCAP 110*   Lipid Profile: No results for input(s): CHOL, HDL, LDLCALC, TRIG, CHOLHDL, LDLDIRECT in the last 72 hours. Thyroid Function Tests: No results for input(s): TSH, T4TOTAL, FREET4, T3FREE, THYROIDAB in the last 72 hours. Anemia Panel: No results for input(s): VITAMINB12, FOLATE, FERRITIN, TIBC, IRON, RETICCTPCT in the last 72 hours. Urine analysis:    Component Value Date/Time   COLORURINE AMBER (A) 10/11/2016 2328   APPEARANCEUR CLEAR 10/11/2016 2328   LABSPEC 1.026  10/11/2016 2328   PHURINE 6.0 10/11/2016 2328   GLUCOSEU NEGATIVE 10/11/2016 2328   HGBUR NEGATIVE 10/11/2016 2328   HGBUR negative 10/08/2010 0853   BILIRUBINUR MODERATE (A) 10/11/2016 2328   BILIRUBINUR SMALL 12/14/2015 1139   KETONESUR NEGATIVE 10/11/2016 2328   PROTEINUR 30 (A) 10/11/2016 2328   UROBILINOGEN 1.0 12/14/2015 1139   UROBILINOGEN 0.2 09/13/2015 0051   NITRITE NEGATIVE 10/11/2016 2328   LEUKOCYTESUR NEGATIVE 10/11/2016 2328   Sepsis Labs: @LABRCNTIP (procalcitonin:4,lacticidven:4)  ) Recent Results (from the past 240 hour(s))  MRSA PCR Screening     Status: None   Collection Time: 07/17/17  8:59 PM  Result Value Ref Range Status   MRSA by PCR NEGATIVE NEGATIVE Final    Comment:        The GeneXpert MRSA Assay (FDA approved for NASAL specimens only), is one component of a comprehensive MRSA colonization surveillance program. It is not intended to diagnose MRSA infection nor to guide or monitor treatment for MRSA infections.          Radiology Studies: Dg Abd 1 View  Result  Date: 07/17/2017 CLINICAL DATA:  Abdominal pain with nausea and vomiting EXAM: ABDOMEN - 1 VIEW COMPARISON:  12/30/2016 FINDINGS: Vascular calcifications in the upper abdomen. Non obstructed bowel gas pattern. No pathologic calcifications. IMPRESSION: Nonobstructed bowel-gas pattern Electronically Signed   By: Donavan Foil M.D.   On: 07/17/2017 21:43        Scheduled Meds: . methylPREDNISolone (SOLU-MEDROL) injection  40 mg Intravenous Daily  . mycophenolate  360 mg Oral BID  . [START ON 07/21/2017] pantoprazole  40 mg Intravenous Q12H  . tacrolimus  2 mg Oral QPM  . tacrolimus  3 mg Oral [id number]   Continuous Infusions: . sodium chloride 1,000 mL (07/17/17 2059)  . chlorproMAZINE (THORAZINE) IV    . pantoprozole (PROTONIX) infusion 8 mg/hr (07/17/17 2113)     LOS: 1 day    Time spent: 40 minutes    WOODS, Geraldo Docker, MD [facility] Pager [phone]   If  7PM-7AM, please contact night-coverage [email or web address] Password [email or web address] 07/18/2017, 8:00 AM

## 2017-07-18 NOTE — Consult Note (Signed)
Referring Provider: [facility]  Primary Care Physician:  Golden Circle, FNP Primary Gastroenterologist:   Silvano Rusk,   MD  Reason for Consultation:  hematemesis  ASSESSMENT AND PLAN:   3. 41 yo male admitted with several episodes of hematemesis yesterday. Admitted with hematemesis in January with endoscopic findings of distal hemorrhagic esophageal mucosa.  -At present this appears to be low volume bleeding. He is heaving up clear material. No black stools. Normal BUN, hgb remains normal, hemodynamically stable.  Would treat with scheduled anti-emetics, PPI and reserve endoscopic workup for now. Need to get vomiting under control so he tolerate meds, specifically his Prograf -monitor CBC -when tolerating PO he will need small, frequent meals. Low fiber (fiber can lead to bezoars)   2. Hx of diabetes / ESRD, s/p kidney- pancreas transplant 4 years ago. On immunosuppressants  3. Severe HTN   Addendum 10: 20am:  RN just called me. Wife in room and has questions about plan. I updated wife about our plan to treat conservatively for now, hold off on EGD. She is concerned about his episodic vomiting. I explained that despite renal - pancreas transplant his diabetic gastroparesis may be only partially reversible.    HPI: [name] is a 41 y.o. male with multiple medical problems. He has HTN, BKA x3. He has hx of diabetes with associated gastroparesis and ESRD, s/p kidney transplant a few years ago.  He present yesterday to ED with hematemesis. His hgb was and has remained normal around 17. BUN normal. Mildly tachycardic in ED but that quickly resolved. He was markedly hypertensive, now improved some. Nausea and vomiting started two days ago. The first emesis was "foam". He subsequently vomited dark brown material (several times). He has diffuse upper abdominal discomfort which started with the vomiting. No NSAID use. BMs are normal. No black stools. His weight fluctuates. He  has been on Reglan for years. Tried Zofran and Phenergan but nothing helps. Recently took Doxycyline for hand infection. No other new medications.   Empirical esophageal dilation for dysphagia in March 2018, moderate amount of food in stomach. Swallowing fine now. He takes daily Prilosec for hx of GERD   Past Medical History:  Diagnosis Date  . 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 Arthritis    "I think I do; just in my fingers & my hands"  . Blood transfusion   . Cataract   . Chronic pain   . Depression    Patient states he has never been depressed.  . Diabetes mellitus without complication ([place])    no since pancreas transplant  . Dialysis patient [facility]) 04/18/12   "[facility]; Tues, [place], [place]"  . Edema   . ESRD (end stage renal disease) on dialysis ([place])   . Gastroparesis   . Gastropathy   . GERD (gastroesophageal reflux disease)   . Headache(784.0)   . Hypertension     Past Surgical History:  Procedure Laterality Date  . AV FISTULA PLACEMENT  08/2011   left upper arm  . BELOW KNEE LEG AMPUTATION  "it's been awhile"   bilaterally  . CATARACT EXTRACTION  ~ 2011   right  . COMBINED KIDNEY-PANCREAS TRANSPLANT    . ESOPHAGOGASTRODUODENOSCOPY N/A 12/30/2016   Procedure: ESOPHAGOGASTRODUODENOSCOPY (EGD);  Surgeon: Carol Ada, MD;  Location: [facility] ENDOSCOPY;  Service: Endoscopy;  Laterality: N/A;    Prior to Admission medications   Medication Sig Start Date End Date Taking? Authorizing Provider  aspirin EC 81 MG  tablet Take 81 mg by mouth daily.   Yes [provider]  losartan (COZAAR) 100 MG tablet Take 100 mg by mouth daily.  09/08/16  Yes [provider]  metoCLOPramide (REGLAN) 10 MG tablet Take 1 tablet (10 mg total) by mouth 4 (four) times daily. Take 1tab 20-30 minutes before breakfast, lunch, dinner and at bedtime 03/01/17  Yes Levin Erp, [place]  mycophenolate (MYFORTIC) 180 MG EC tablet Take 360 mg by mouth 2 (two) times  daily.  07/06/13  Yes [provider]  omeprazole (PRILOSEC) 40 MG capsule Take 1 capsule (40 mg total) by mouth daily. Take 1 cap 30-60 minutes before breakfast 03/01/17  Yes Lemmon, Lavone Nian, PA  ondansetron (ZOFRAN ODT) 4 MG disintegrating tablet Take 1 tablet (4 mg total) by mouth every 8 (eight) hours as needed for nausea or vomiting. 01/08/15  Yes Gareth Morgan, MD  oxyCODONE (ROXICODONE) 15 MG immediate release tablet Take 15 mg by mouth every 6 (six) hours as needed for pain.  03/17/17  Yes [provider]  predniSONE (DELTASONE) 5 MG tablet Take 5 mg by mouth daily.  06/20/13  Yes [provider]  promethazine (PHENERGAN) 25 MG tablet Take 25 mg by mouth every 6 (six) hours as needed for nausea or vomiting.   Yes [provider]  saccharomyces boulardii (FLORASTOR) 250 MG capsule Take 250 mg by mouth 2 (two) times daily.   Yes [provider]  sulfamethoxazole-trimethoprim (BACTRIM,SEPTRA) 400-80 MG per tablet Take 1 tablet by mouth every Monday, Wednesday, and Friday.    Yes [provider]  tacrolimus (PROGRAF) 1 MG capsule Take 2-3 mg by mouth 2 (two) times daily. 3 mg in the morning, 2 mg in the evening 07/16/13  Yes [provider]  XTAMPZA ER 9 MG C12A Take 9 mg by mouth every 12 (twelve) hours. 06/19/17  Yes [provider]  doxycycline (VIBRAMYCIN) 100 MG capsule Take 1 capsule (100 mg total) by mouth 2 (two) times daily. Patient not taking: Reported on 07/17/2017 05/14/17   Davonna Belling, MD    Current Facility-Administered Medications  Medication Dose Route Frequency Provider Last Rate Last Dose  . 0.9 %  sodium chloride infusion   Intravenous Continuous Rise Patience, MD 50 mL/hr at 07/17/17 2059 1,000 mL at 07/17/17 2059  . acetaminophen (TYLENOL) tablet 650 mg  650 mg Oral Q6H PRN Rise Patience, MD       Or  . acetaminophen (TYLENOL) suppository 650 mg  650 mg Rectal Q6H PRN Rise Patience, MD      . chlorproMAZINE (THORAZINE) 12.5 mg in sodium chloride 0.9 % 25 mL IVPB  12.5 mg Intravenous Once Allie Bossier, MD      . hydrALAZINE (APRESOLINE) injection 10 mg  10 mg Intravenous Q4H PRN Rise Patience, MD   10 mg at 07/18/17 4259  . methylPREDNISolone sodium succinate (SOLU-MEDROL) 40 mg/mL injection 40 mg  40 mg Intravenous Daily Rise Patience, MD   40 mg at 07/17/17 2241  . morphine 4 MG/ML injection 2 mg  2 mg Intravenous Q2H PRN Rise Patience, MD   2 mg at 07/18/17 0750  . mycophenolate (MYFORTIC) EC tablet 360 mg  360 mg Oral BID Rise Patience, MD   360 mg at 07/17/17 2339  . ondansetron (ZOFRAN) tablet 4 mg  4 mg Oral Q6H PRN Rise Patience, MD       Or  . ondansetron [facility]) injection 4  mg  4 mg Intravenous Q6H PRN Rise Patience, MD   4 mg at 07/18/17 0538  . pantoprazole (PROTONIX) 80 mg in sodium chloride 0.9 % 250 mL (0.32 mg/mL) infusion  8 mg/hr Intravenous Continuous Rise Patience, MD 25 mL/hr at 07/17/17 2113 8 mg/hr at 07/17/17 2113  . [START ON 07/21/2017] pantoprazole (PROTONIX) injection 40 mg  40 mg Intravenous Q12H Rise Patience, MD      . tacrolimus (PROGRAF) capsule 2 mg  2 mg Oral QPM Rise Patience, MD   2 mg at 07/17/17 2339  . tacrolimus (PROGRAF) capsule 3 mg  3 mg Oral Antonieta Loveless, MD        Allergies as of 07/17/2017 - Review Complete 07/17/2017  Allergen Reaction Noted  . Lisinopril  02/17/2017  . Amlodipine Rash 02/17/2017    Family History  Problem Relation Age of Onset  . Diabetes Other   . Hypertension Other   . Hypertension Mother   . Diabetes Mother   . Kidney disease Mother   . Diabetes Maternal Grandmother   . Diabetes Paternal Grandmother   . Colon cancer Neg Hx   . Esophageal cancer Neg Hx   . Rectal cancer Neg Hx   . Stomach cancer Neg Hx     Social History   Social History  . Marital status: Married    Spouse name: N/A  . Number of  children: 1  . Years of education: 10   Occupational History  . Disability    Social History Main Topics  . Smoking status: Current Some Day Smoker  . Smokeless tobacco: Never Used  . Alcohol use No  . Drug use: Yes    Frequency: 3.0 times per week    Types: Marijuana     Comment: Occasionally  . Sexual activity: Not Currently   Other Topics Concern  . Not on file   Social History Narrative   Fun: Restore old cars     Review of Systems: All systems reviewed and negative except where noted in HPI.  Physical Exam: Vital signs in last 24 hours: Temp:  [98.6 F (37 C)-99.6 F (37.6 C)] 99.6 F (37.6 C) (07/24 0721) Pulse Rate:  [82-102] 96 (07/24 0721) Resp:  [16-28] 26 (07/24 0721) BP: (131-244)/(71-139) 186/92 (07/24 0721) SpO2:  [91 %-100 %] 96 % (07/24 0721) FiO2 (%):  [0 %] 0 % (07/23 2017) Weight:  [148 lb 2.4 oz (67.2 kg)-151 lb 7.3 oz (68.7 kg)] 151 lb 7.3 oz (68.7 kg) (07/24 0248) Last BM Date: 07/16/17 General:   Alert, well-developed, black male in NAD Psych:  Pleasant, cooperative. Normal mood and affect. Eyes:  Pupils equal, sclera clear, no icterus.   Conjunctiva pink. Ears:  Normal auditory acuity. Nose:  No deformity, discharge,  or lesions. Neck:  Supple; no masses Lungs:  Clear throughout to auscultation.   No wheezes, crackles, or rhonchi.  Heart:  Regular rate and rhythm; no murmurs, no edema Abdomen:  Soft, non-distended, nontender, BS active, no palp mass    Rectal:  Deferred  Msk:  Symmetrical without gross deformities. . Pulses:  Normal pulses noted. Neurologic:  Alert and  oriented x4;  grossly normal neurologically. Skin:  Intact without significant lesions or rashes..   Intake/Output from previous day: 07/23 0701 - 07/24 0700 In: 770.4 [I.V.:670.4; IV Piggyback:100] Out: 750 [Urine:250; Emesis/NG output:500] Intake/Output this shift: No intake/output data recorded.  Lab Results:  Recent Labs  07/17/17 2119 07/18/17 0013  07/18/17  0422  WBC 6.9 6.7 6.1  HGB 16.8 16.5 17.0  HCT 51.4 50.7 52.3*  PLT 242 243 209   BMET  Recent Labs  07/17/17 1716  NA 140  K 4.2  CL 105  CO2 24  GLUCOSE 105*  BUN 15  CREATININE 1.63*  CALCIUM 9.8   LFT  Recent Labs  07/17/17 1716  PROT 7.8  ALBUMIN 4.2  AST 19  ALT 18  ALKPHOS 97  BILITOT 0.9    Studies/Results: Dg Abd 1 View  Result Date: 07/17/2017 CLINICAL DATA:  Abdominal pain with nausea and vomiting EXAM: ABDOMEN - 1 VIEW COMPARISON:  12/30/2016 FINDINGS: Vascular calcifications in the upper abdomen. Non obstructed bowel gas pattern. No pathologic calcifications. IMPRESSION: Nonobstructed bowel-gas pattern Electronically Signed   By: Donavan Foil M.D.   On: 07/17/2017 21:43   Tye Savoy, NP-C @  07/18/2017, 8:46 AM  Pager number [phone]   I have reviewed the entire case in detail with the above APP and discussed the plan in detail.  Therefore, I agree with the diagnoses recorded above. In addition,  I have personally interviewed and examined the patient and have personally reviewed any abdominal/pelvic CT scan images.  My additional thoughts are as follows:  This appears to be an exacerbation of his gastroparesis for unclear reasons. Conservative treatment as outlined above.  No current need for EGD with small volume hematemesis that has stopped. We will follow at least until tomorrow.   Nelida Meuse III Pager [phone]  Mon-Fri 8a-5p [phone] after 5p, weekends, holidays

## 2017-07-19 DIAGNOSIS — G43A0 Cyclical vomiting, not intractable: Secondary | ICD-10-CM

## 2017-07-19 DIAGNOSIS — K922 Gastrointestinal hemorrhage, unspecified: Secondary | ICD-10-CM

## 2017-07-19 MED ORDER — LOSARTAN POTASSIUM 50 MG PO TABS
100.0000 mg | ORAL_TABLET | Freq: Every day | ORAL | Status: DC
  Administered 2017-07-19: 100 mg via ORAL
  Filled 2017-07-19: qty 2

## 2017-07-19 MED ORDER — PROMETHAZINE HCL 25 MG/ML IJ SOLN
12.5000 mg | Freq: Once | INTRAMUSCULAR | Status: AC
  Administered 2017-07-19: 12.5 mg via INTRAVENOUS
  Filled 2017-07-19: qty 1

## 2017-07-19 MED ORDER — SODIUM CHLORIDE 0.9 % IV SOLN
25.0000 mg | Freq: Four times a day (QID) | INTRAVENOUS | Status: DC | PRN
  Administered 2017-07-19: 25 mg via INTRAVENOUS
  Filled 2017-07-19 (×2): qty 1

## 2017-07-19 MED ORDER — CLONIDINE HCL 0.1 MG/24HR TD PTWK
0.1000 mg | MEDICATED_PATCH | TRANSDERMAL | Status: DC
  Filled 2017-07-19: qty 1

## 2017-07-19 MED ORDER — TACROLIMUS 1 MG PO CAPS
3.0000 mg | ORAL_CAPSULE | Freq: Every day | ORAL | Status: DC
  Administered 2017-07-20: 3 mg via ORAL
  Filled 2017-07-19: qty 3

## 2017-07-19 MED ORDER — TACROLIMUS 1 MG PO CAPS: 2.0000 mg | ORAL_CAPSULE | Freq: Every day | ORAL | Status: DC

## 2017-07-19 MED ORDER — PROMETHAZINE HCL 25 MG PO TABS: 25.0000 mg | ORAL_TABLET | Freq: Three times a day (TID) | ORAL | Status: DC | PRN

## 2017-07-19 NOTE — Progress Notes (Signed)
Patient refuses to get his blood sugar checked

## 2017-07-19 NOTE — Plan of Care (Signed)
Pt was admitted with n/v and hematemesis. BP meds were changed to other routes besides po due to the n/v. Pt is refusing his clonidine patch which is to start tonight. He wants his home Cozaar. Given he is no longer having vomiting and is on clear liquid diet, will restart home Losartan and d/c the patch and monitor.  KJKG, NP [facility]

## 2017-07-19 NOTE — Progress Notes (Signed)
[facility] Progress Note  Chief Complaint:   Vomiting and hematemesis  Subjective: Tolerating little if any PO this am because of vomiting.   Objective:  Vital signs in last 24 hours: Temp:  [98.1 F (36.7 C)-99 F (37.2 C)] 99 F (37.2 C) (07/25 0400) Pulse Rate:  [95-97] 97 (07/24 1650) Resp:  [13-20] 19 (07/25 0400) BP: (118-201)/(70-102) 138/76 (07/25 0400) SpO2:  [94 %-98 %] 98 % (07/25 0400) Last BM Date: 07/18/17 General:   Alert, well-developed, black male NAD. Wife at bedside EENT:  Normal hearing, non icteric sclera, conjunctive pink.  Heart:  Regular rate and rhythm Pulm: Normal respiratory effort. Abdomen:  Soft, nondistended, nontender.  Normal bowel sounds Neurologic:  Alert and  oriented x4;  grossly normal neurologically. Psych:  Pleasant, cooperative.  Normal mood and affect.   Intake/Output from previous day: 07/24 0701 - 07/25 0700 In: 1325 [I.V.:1325] Out: 900 [Urine:900] Intake/Output this shift: Total I/O In: 157.5 [I.V.:157.5] Out: 250 [Urine:250]  Lab Results:  Recent Labs  07/17/17 2119 07/18/17 0013 07/18/17 0422  WBC 6.9 6.7 6.1  HGB 16.8 16.5 17.0  HCT 51.4 50.7 52.3*  PLT 242 243 209   BMET  Recent Labs  07/17/17 1716  NA 140  K 4.2  CL 105  CO2 24  GLUCOSE 105*  BUN 15  CREATININE 1.63*  CALCIUM 9.8   LFT  Recent Labs  07/17/17 1716  PROT 7.8  ALBUMIN 4.2  AST 19  ALT 18  ALKPHOS 97  BILITOT 0.9     Dg Abd 1 View  Result Date: 07/17/2017 CLINICAL DATA:  Abdominal pain with nausea and vomiting EXAM: ABDOMEN - 1 VIEW COMPARISON:  12/30/2016 FINDINGS: Vascular calcifications in the upper abdomen. Non obstructed bowel gas pattern. No pathologic calcifications. IMPRESSION: Nonobstructed bowel-gas pattern Electronically Signed   By: Donavan Foil M.D.   On: 07/17/2017 21:43    ASSESSMENT / PLAN:   41 yo male with chronic cyclic vomiting most likely from gastroparesis, possibly narcotics as  well. Currently getting scheduled (and staggered) doses of Zofran and Reglan. Did better yesterday though didn't eat much, if anything. This am he had recurrent blood tinged vomiting. Wife at bedside requesting phenergan, patient takes it at home. Still feel this was low volume GI bleeding. Not overly concerned about the blood tinged emesis, it is not surprising given amount of vomiting.  -will give one dose of IV phenergan then give it PO prn. -Will consider IV erythromycin if not improvement soon -continue BID PPI.   Principal Problem:   Acute GI bleeding Active Problems:   S/P bilateral BKA (below knee amputation) ([place])   Renal transplant, status post   History of pancreas transplant ([employer])   Diabetic gastroparesis associated with type 1 diabetes mellitus ([place])     LOS: 2 days   Tye Savoy ,NP 07/19/2017, 9:29 AM  Pager number [phone]   I have discussed the case with the PA, and that is the plan I formulated. I personally interviewed and examined the patient.  His nausea is about the same, which is common in this scenario.  BP definitely better. I think this will slowly improve with time, patience and the many meds he is currently receiving. I am not currently planning to add erythromycin. No current plans for EGD, as I do not think that will add anything to our current management.   Nelida Meuse III Pager [phone]  Mon-Fri 8a-5p [phone] after 5p, weekends, holidays

## 2017-07-19 NOTE — Progress Notes (Signed)
[place] TEAM 1 - Stepdown/ICU TEAM  [name]  QZE:[id number] DOB: 22-Jan-1976 DOA: 07/17/2017 PCP: Golden Circle, FNP    Brief Narrative:  41 y.o.M w/ Hx of Depression, DM, Gastroparesis, S/P Renal and Pancreas transplant, Chronic pain syndrome, HTN, BKA, and Drug abuse (marijuana) who presented to the ER with complaints of persistent nausea w/ vomiting for 12 hours. Patient eventually started having bloody vomitus.   In the ER patient had 2-3 episodes of hematemesis.   Subjective: The patient is resting comfortably in bed at the time of visit.  He denies any further hematemesis today.  He has however been suffering with unrelenting severe nausea.  It has thus far been refractory to Zofran and Phenergan.  He tells me he has experienced significant benefit from Thorazine in the past.  Assessment & Plan:  Hematemesis - UGIB GI following - appears to be primarily related to multiple episodes of vomiting - at present there are no plans for endoscopy - follow clinically  Intractable Nausea w/ vomiting  - Diabetic gastroparesis  Potentially related to diabetic gastroparesis - avoid narcotics - continue usual medical therapy - GI assisting  Hx Renal and Pancreas transplant   HTN Blood pressure poorly controlled in the setting of severe intractable nausea - adjust medical therapy with primary focus on addressing underlying driving issue  DVT prophylaxis: SCDs Code Status: FULL CODE Family Communication: Spoke with wife at bedside Disposition Plan: Stable for transfer to telemetry bed  Consultants:  [facility]  Procedures: none  Antimicrobials:  none   Objective: Blood pressure 138/76, pulse 97, temperature 99 F (37.2 C), temperature source Oral, resp. rate 19, height 6' (1.829 m), weight 68.7 kg (151 lb 7.3 oz), SpO2 98 %.  Intake/Output Summary (Last 24 hours) at 07/19/17 0958 Last data filed at 07/19/17 0830  Gross per 24 hour  Intake           1482.5 ml    Output             1150 ml  Net            332.5 ml   Filed Weights   07/17/17 2055 07/18/17 0248  Weight: 67.2 kg (148 lb 2.4 oz) 68.7 kg (151 lb 7.3 oz)    Examination: General: No acute respiratory distress - appears very uncomfortable w/ nausea Lungs: Clear to auscultation bilaterally without wheezes or crackles Cardiovascular: Mildly tachycardic but regular - no murmur Abdomen: Nontender, nondistended, soft Extremities: No significant cyanosis, clubbing, or edema bilateral lower extremities  CBC:  Recent Labs Lab 07/17/17 1716 07/17/17 2119 07/18/17 0013 07/18/17 0422  WBC 7.4 6.9 6.7 6.1  HGB 18.2* 16.8 16.5 17.0  HCT 55.9* 51.4 50.7 52.3*  MCV 93.0 92.3 92.9 93.9  PLT 231 242 243 226   Basic Metabolic Panel:  Recent Labs Lab 07/17/17 1716  NA 140  K 4.2  CL 105  CO2 24  GLUCOSE 105*  BUN 15  CREATININE 1.63*  CALCIUM 9.8   GFR: Estimated Creatinine Clearance: 57.4 mL/min (A) (by C-G formula based on SCr of 1.63 mg/dL (H)).  Liver Function Tests:  Recent Labs Lab 07/17/17 1716  AST 19  ALT 18  ALKPHOS 97  BILITOT 0.9  PROT 7.8  ALBUMIN 4.2    CBG:  Recent Labs Lab 07/17/17 1650  GLUCAP 110*    Recent Results (from the past 240 hour(s))  MRSA PCR Screening     Status: None   Collection Time: 07/17/17  8:59 PM  Result Value Ref Range Status   MRSA by PCR NEGATIVE NEGATIVE Final    Comment:        The GeneXpert MRSA Assay (FDA approved for NASAL specimens only), is one component of a comprehensive MRSA colonization surveillance program. It is not intended to diagnose MRSA infection nor to guide or monitor treatment for MRSA infections.      Scheduled Meds: . methylPREDNISolone (SOLU-MEDROL) injection  40 mg Intravenous Daily  . metoCLOPramide (REGLAN) injection  10 mg Intravenous Q6H  . mycophenolate  360 mg Oral BID  . ondansetron (ZOFRAN) IV  4 mg Intravenous Q6H   Or  . ondansetron  4 mg Oral Q6H  . [START ON  07/21/2017] pantoprazole  40 mg Intravenous Q12H  . promethazine  12.5 mg Intravenous Once  . tacrolimus  2 mg Oral QPM  . tacrolimus  3 mg Oral BH-[id number]     LOS: 2 days   Cherene Altes, MD [facility] Office  [phone] Pager - Text Page per Amion as per below:  On-Call/Text Page:      [email or web address]      password [email or web address]  If 7PM-7AM, please contact night-coverage [email or web address] Password [facility] 07/19/2017, 9:58 AM

## 2017-07-20 DIAGNOSIS — Z940 Kidney transplant status: Secondary | ICD-10-CM

## 2017-07-20 DIAGNOSIS — Z89512 Acquired absence of left leg below knee: Secondary | ICD-10-CM

## 2017-07-20 DIAGNOSIS — K3184 Gastroparesis: Secondary | ICD-10-CM

## 2017-07-20 DIAGNOSIS — Z89511 Acquired absence of right leg below knee: Secondary | ICD-10-CM

## 2017-07-20 DIAGNOSIS — Z9483 Pancreas transplant status: Secondary | ICD-10-CM

## 2017-07-20 DIAGNOSIS — I10 Essential (primary) hypertension: Secondary | ICD-10-CM

## 2017-07-20 DIAGNOSIS — E1043 Type 1 diabetes mellitus with diabetic autonomic (poly)neuropathy: Secondary | ICD-10-CM

## 2017-07-20 LAB — TACROLIMUS LEVEL: TACROLIMUS (FK506) - LABCORP: 5.1 ng/mL (ref 2.0–20.0)

## 2017-07-20 LAB — MYCOPHENOLIC ACID (CELLCEPT)
MPA GLUCURONIDE: 23 ug/mL (ref 15–125)
MPA: 0.7 ug/mL — AB (ref 1.0–3.5)

## 2017-07-20 MED ORDER — METHYLPREDNISOLONE SODIUM SUCC 40 MG IJ SOLR
20.0000 mg | Freq: Every day | INTRAMUSCULAR | Status: DC
  Administered 2017-07-20: 20 mg via INTRAVENOUS
  Filled 2017-07-20: qty 1

## 2017-07-20 NOTE — Progress Notes (Signed)
Lab Tech informed that patient refused his scheduled Labs.

## 2017-07-20 NOTE — Progress Notes (Signed)
Patient discharged to home, IV and tele removed, AVS reviewed with patient and spouse, patient refused wheelchair and left floor with family member, v/s stable and no c/o's

## 2017-07-20 NOTE — Progress Notes (Signed)
Pt bp 189/112 HR 93 when he arrived to unit IV site was clotted and not able to give IV Lopressor at this time when IV team placed IV blood pressor was not in the range to receive medication. Arthor Captain LPN

## 2017-07-20 NOTE — Discharge Summary (Signed)
Physician Discharge Summary  [name] NAT:[id number] DOB: January 05, 1976 DOA: 07/17/2017  PCP: Golden Circle, FNP  Admit date: 07/17/2017 Discharge date: 07/20/2017  Time spent: 45 minutes  Recommendations for Outpatient Follow-up:  Patient will be discharged to home.  Patient will need to follow up with primary care provider within one week of discharge, repeat CBC. Follow up with prescribing physician regarding CellCept dosage, as your level was low. Follow up with gastroenterology as needed. Patient should continue medications as prescribed.  Patient should follow a renal/carb modified diet. Would avoid narcotics as this could worsen your gastroparesis.  Discharge Diagnoses:  Acute GI bleed/hematemesis Nausea/vomiting/abdominal pain/diabetic gastroparesis Essential hypertension  History of renal and pancreatic transplant  Discharge Condition: stable  Diet recommendation: renal/carb modified   Filed Weights   07/17/17 2055 07/18/17 0248 07/20/17 0300  Weight: 67.2 kg (148 lb 2.4 oz) 68.7 kg (151 lb 7.3 oz) 68.1 kg (150 lb 1.6 oz)    History of present illness:  On 07/17/2017 by Dr. Gean Birchwood [name] is a 41 y.o. male with history of gastroparesis, renal and pancreas transplant, chronic pain, hypertension presents to the ER with complaints of persistent nausea vomiting since this morning. Patient eventually started having bloody vomitus. Also has been having lower abdominal discomfort.   Hospital Course:  Acute GI bleed/hematemesis -Gastroenterology consulted and appreciated -Hemoglobin has remained stable, last hemoglobin was 17 on 07/18/17 -Patient has refused further lab draws -Patient did have multiple episodes of vomiting during this hospitalization. He denies any further nausea or vomiting. -Gastroenterology at present time does not plan for endoscopy and will follow clinically -Discussed with gastroenterology today, patient has been refusing  medications, lab draws, current diet. Will discharge patient, he may follow-up with gastric urology as needed. -Repeat CBC in one week  Nausea/vomiting/abdominal pain/diabetic gastroparesis -Patient was placed on clear liquid diet however is now refusing the diet. Would like to eat solid food. -Gastroenterology consulted and appreciated -Patient was placed on Reglan, Zofran, promethazine -Patient was refusing his Reglan states he takes as needed. -Discussed with gastroenterology, will place patient on soft diet, he may follow-up with GI as an outpatient if needed.  Essential hypertension -Patient refused clonidine patch -Continue losartan  History of renal and pancreatic transplant -CellCept 0.7 (low), Prograf 5.1 (WNL) (levels were obtained because increased levels could potentially lead to HTN and GIB) -Was placed on IV Solu-Medrol for stress dosing during hospitalization -Continue home dose of prednisone upon discharge -Resume Bactrim at discharge -Continue cellcept, follow up with prescribing physician for dosing (also discussed with pharmacy, who agreed that patient should f/up with PCP).  -Continue prograf  Procedures: None  Consultations: Gastroenterology  Discharge Exam: Vitals:   07/20/17 0542 07/20/17 0857  BP: (!) 156/77 (!) 175/83  Pulse: 81 83  Resp: 19 18  Temp: 98.2 F (36.8 C) 98.3 F (36.8 C)   Patient denies further nausea or vomiting. Was able TE crackers overnight with no episodes of abdominal pain or vomiting. Patient states he would like to eat solid food. Did not like the idea of being placed on a soft diet, stated he would eat when he got home. Currently denies chest pain, shortness of breath, dizziness, headache.   General: Well developed, well nourished, NAD, appears stated age  HEENT: NCAT, mucous membranes moist.  Cardiovascular: S1 S2 auscultated, RRR, no murmurs  Respiratory: Clear to auscultation bilaterally with equal chest  rise  Abdomen: Soft, nontender, nondistended, + bowel sounds  Extremities: B/L BKA  Neuro: AAOx3, nonfocal  Psych: Appropriate  Discharge Instructions Discharge Instructions    Discharge instructions    Complete by:  As directed    Patient will be discharged to home.  Patient will need to follow up with primary care provider within one week of discharge, repeat CBC. Follow up with prescribing physician regarding CellCept dosage, as your level was low. Follow up with gastroenterology as needed. Patient should continue medications as prescribed.  Patient should follow a renal/carb modified diet. Would avoid narcotics as this could worsen your gastroparesis.     Current Discharge Medication List    CONTINUE these medications which have NOT CHANGED   Details  aspirin EC 81 MG tablet Take 81 mg by mouth daily.    losartan (COZAAR) 100 MG tablet Take 100 mg by mouth daily.     metoCLOPramide (REGLAN) 10 MG tablet Take 1 tablet (10 mg total) by mouth 4 (four) times daily. Take 1tab 20-30 minutes before breakfast, lunch, dinner and at bedtime Qty: 120 tablet, Refills: 1    mycophenolate (MYFORTIC) 180 MG EC tablet Take 360 mg by mouth 2 (two) times daily.     omeprazole (PRILOSEC) 40 MG capsule Take 1 capsule (40 mg total) by mouth daily. Take 1 cap 30-60 minutes before breakfast Qty: 90 capsule, Refills: 1    ondansetron (ZOFRAN ODT) 4 MG disintegrating tablet Take 1 tablet (4 mg total) by mouth every 8 (eight) hours as needed for nausea or vomiting. Qty: 20 tablet, Refills: 0    predniSONE (DELTASONE) 5 MG tablet Take 5 mg by mouth daily.     promethazine (PHENERGAN) 25 MG tablet Take 25 mg by mouth every 6 (six) hours as needed for nausea or vomiting.    saccharomyces boulardii (FLORASTOR) 250 MG capsule Take 250 mg by mouth 2 (two) times daily.    sulfamethoxazole-trimethoprim (BACTRIM,SEPTRA) 400-80 MG per tablet Take 1 tablet by mouth every Monday, Wednesday, and Friday.      tacrolimus (PROGRAF) 1 MG capsule Take 2-3 mg by mouth 2 (two) times daily. 3 mg in the morning, 2 mg in the evening    XTAMPZA ER 9 MG C12A Take 9 mg by mouth every 12 (twelve) hours. Refills: 0      STOP taking these medications     oxyCODONE (ROXICODONE) 15 MG immediate release tablet      doxycycline (VIBRAMYCIN) 100 MG capsule        Allergies  Allergen Reactions  . Lisinopril     Cough  . Amlodipine Rash   Follow-up Information    Golden Circle, FNP. Schedule an appointment as soon as possible for a visit in 1 week(s).   Specialty:  Family Medicine Why:  Hospital follow up  Contact information: [place] [address] [phone]        Doran Stabler, MD. Go to.   Specialty:  Gastroenterology Why:  As needed  Contact information: [address] [place] [address] [phone]            The results of significant diagnostics from this hospitalization (including imaging, microbiology, ancillary and laboratory) are listed below for reference.    Significant Diagnostic Studies: Dg Abd 1 View  Result Date: 07/17/2017 CLINICAL DATA:  Abdominal pain with nausea and vomiting EXAM: ABDOMEN - 1 VIEW COMPARISON:  12/30/2016 FINDINGS: Vascular calcifications in the upper abdomen. Non obstructed bowel gas pattern. No pathologic calcifications. IMPRESSION: Nonobstructed bowel-gas pattern Electronically Signed  By: Donavan Foil M.D.   On: 07/17/2017 21:43    Microbiology: Recent Results (from the past 240 hour(s))  MRSA PCR Screening     Status: None   Collection Time: 07/17/17  8:59 PM  Result Value Ref Range Status   MRSA by PCR NEGATIVE NEGATIVE Final    Comment:        The GeneXpert MRSA Assay (FDA approved for NASAL specimens only), is one component of a comprehensive MRSA colonization surveillance program. It is not intended to diagnose MRSA infection nor to guide or monitor treatment for MRSA infections.       Labs: Basic Metabolic Panel:  Recent Labs Lab 07/17/17 1716  NA 140  K 4.2  CL 105  CO2 24  GLUCOSE 105*  BUN 15  CREATININE 1.63*  CALCIUM 9.8   Liver Function Tests:  Recent Labs Lab 07/17/17 1716  AST 19  ALT 18  ALKPHOS 97  BILITOT 0.9  PROT 7.8  ALBUMIN 4.2   No results for input(s): LIPASE, AMYLASE in the last 168 hours. No results for input(s): AMMONIA in the last 168 hours. CBC:  Recent Labs Lab 07/17/17 1716 07/17/17 2119 07/18/17 0013 07/18/17 0422  WBC 7.4 6.9 6.7 6.1  HGB 18.2* 16.8 16.5 17.0  HCT 55.9* 51.4 50.7 52.3*  MCV 93.0 92.3 92.9 93.9  PLT 231 242 243 209   Cardiac Enzymes: No results for input(s): CKTOTAL, CKMB, CKMBINDEX, TROPONINI in the last 168 hours. BNP: BNP (last 3 results) No results for input(s): BNP in the last 8760 hours.  ProBNP (last 3 results) No results for input(s): PROBNP in the last 8760 hours.  CBG:  Recent Labs Lab 07/17/17 1650  GLUCAP 110*       Signed:  Cristal Ford  [facility] 07/20/2017, 12:08 PM

## 2017-07-20 NOTE — Discharge Instructions (Signed)
Gastroparesis °Gastroparesis, also called delayed gastric emptying, is a condition in which food takes longer than normal to empty from the stomach. The condition is usually long-lasting (chronic). °What are the causes? °This condition may be caused by: °· An endocrine disorder, such as hypothyroidism or diabetes. Diabetes is the most common cause of this condition. °· A nervous system disease, such as Parkinson disease or multiple sclerosis. °· Cancer, infection, or surgery of the stomach or vagus nerve. °· A connective tissue disorder, such as scleroderma. °· Certain medicines. ° °In most cases, the cause is not known. °What increases the risk? °This condition is more likely to develop in: °· People with certain disorders, including endocrine disorders, eating disorders, amyloidosis, and scleroderma. °· People with certain diseases, including Parkinson disease or multiple sclerosis. °· People with cancer or infection of the stomach or vagus nerve. °· People who have had surgery on the stomach or vagus nerve. °· People who take certain medicines. °· Women. ° °What are the signs or symptoms? °Symptoms of this condition include: °· An early feeling of fullness when eating. °· Nausea. °· Weight loss. °· Vomiting. °· Heartburn. °· Abdominal bloating. °· Inconsistent blood glucose levels. °· Lack of appetite. °· Acid from the stomach coming up into the esophagus (gastroesophageal reflux). °· Spasms of the stomach. ° °Symptoms may come and go. °How is this diagnosed? °This condition is diagnosed with tests, such as: °· Tests that check how long it takes food to move through the stomach and intestines. These tests include: °? Upper gastrointestinal (GI) series. In this test, X-rays of the intestines are taken after you drink a liquid. The liquid makes the intestines show up better on the X-rays. °? Gastric emptying scintigraphy. In this test, scans are taken after you eat food that contains a small amount of radioactive  material. °? Wireless capsule GI monitoring system. This test involves swallowing a capsule that records information about movement through the stomach. °· Gastric manometry. This test measures electrical and muscular activity in the stomach. It is done with a thin tube that is passed down the throat and into the stomach. °· Endoscopy. This test checks for abnormalities in the lining of the stomach. It is done with a long, thin tube that is passed down the throat and into the stomach. °· An ultrasound. This test can help rule out gallbladder disease or pancreatitis as a cause of your symptoms. It uses sound waves to take pictures of the inside of your body. ° °How is this treated? °There is no cure for gastroparesis. This condition may be managed with: °· Treatment of the underlying condition causing the gastroparesis. °· Lifestyle changes, including exercise and dietary changes. Dietary changes can include: °? Changes in what and when you eat. °? Eating smaller meals more often. °? Eating low-fat foods. °? Eating low-fiber forms of high-fiber foods, such as cooked vegetables instead of raw vegetables. °? Having liquid foods in place of solid foods. Liquid foods are easier to digest. °· Medicines. These may be given to control nausea and vomiting and to stimulate stomach muscles. °· Getting food through a feeding tube. This may be done in severe cases. °· A gastric neurostimulator. This is a device that is inserted into the body with surgery. It helps improve stomach emptying and control nausea and vomiting. ° °Follow these instructions at home: °· Follow your health care provider's instructions about exercise and diet. °· Take medicines only as directed by your health care provider. °Contact a   health care provider if: °· Your symptoms do not improve with treatment. °· You have new symptoms. °Get help right away if: °· You have severe abdominal pain that does not improve with treatment. °· You have nausea that does  not go away. °· You cannot keep fluids down. °This information is not intended to replace advice given to you by your health care provider. Make sure you discuss any questions you have with your health care provider. °Document Released: 12/12/2005 Document Revised: 05/19/2016 Document Reviewed: 12/08/2014 °Elsevier Interactive Patient Education © 2018 [employer] ° °

## 2017-07-20 NOTE — Progress Notes (Signed)
Patient had home crackers and asked for peanut butter I explained that he is on a clear liquid diet and he stated that he decided to eat because he wanted to see if he could keep it down. He also refuse his Zofran and Reglan IV. Arthor Captain LPN

## 2017-08-21 ENCOUNTER — Ambulatory Visit: Payer: Medicare Other | Admitting: Family

## 2017-08-22 ENCOUNTER — Ambulatory Visit (INDEPENDENT_AMBULATORY_CARE_PROVIDER_SITE_OTHER): Payer: Medicare Other | Admitting: Family

## 2017-08-22 ENCOUNTER — Encounter: Payer: Self-pay | Admitting: Family

## 2017-08-22 VITALS — BP 172/108 | HR 84 | Temp 98.6°F | Resp 16 | Ht 73.0 in | Wt 180.0 lb

## 2017-08-22 DIAGNOSIS — Z23 Encounter for immunization: Secondary | ICD-10-CM | POA: Diagnosis not present

## 2017-08-22 DIAGNOSIS — I10 Essential (primary) hypertension: Secondary | ICD-10-CM | POA: Diagnosis not present

## 2017-08-22 DIAGNOSIS — Z89512 Acquired absence of left leg below knee: Secondary | ICD-10-CM | POA: Diagnosis not present

## 2017-08-22 DIAGNOSIS — Z89511 Acquired absence of right leg below knee: Secondary | ICD-10-CM

## 2017-08-22 NOTE — Progress Notes (Signed)
Subjective:    Patient ID: [name], male    DOB: 01-15-1976, 41 y.o.   MRN: [id number]  Chief Complaint  Patient presents with  . Follow-up    Need supplies for prosthetics    HPI:  [name] is a 41 y.o. male who  has a past medical history of AMPUTATION, BELOW KNEE, HX OF (04/08/2008); Arthritis; Blood transfusion; Cataract; Chronic pain; Depression; Diabetes mellitus without complication ([place]); Dialysis patient [facility]) (04/18/12); Edema; ESRD (end stage renal disease) on dialysis ([place]); Gastroparesis; Gastropathy; GERD (gastroesophageal reflux disease); Headache(784.0); and Hypertension. and presents today for an acute office visit.  Bilateral BKA - Currently with bilateral below knee amputations and functioning well. In need of prosthetic liners and supplies with his current supplies being over a year old. Describes the liners no longer stay like they are supposed be. No skin breakdown or problems.   Allergies  Allergen Reactions  . Lisinopril     Cough  . Amlodipine Rash      Outpatient Medications Prior to Visit  Medication Sig Dispense Refill  . aspirin EC 81 MG tablet Take 81 mg by mouth daily.    [name] losartan (COZAAR) 100 MG tablet Take 100 mg by mouth daily.     . metoCLOPramide (REGLAN) 10 MG tablet Take 1 tablet (10 mg total) by mouth 4 (four) times daily. Take 1tab 20-30 minutes before breakfast, lunch, dinner and at bedtime 120 tablet 1  . mycophenolate (MYFORTIC) 180 MG EC tablet Take 360 mg by mouth 2 (two) times daily.     [name] omeprazole (PRILOSEC) 40 MG capsule Take 1 capsule (40 mg total) by mouth daily. Take 1 cap 30-60 minutes before breakfast 90 capsule 1  . ondansetron (ZOFRAN ODT) 4 MG disintegrating tablet Take 1 tablet (4 mg total) by mouth every 8 (eight) hours as needed for nausea or vomiting. 20 tablet 0  . predniSONE (DELTASONE) 5 MG tablet Take 5 mg by mouth daily.     . promethazine (PHENERGAN) 25 MG tablet Take 25 mg by mouth every 6  (six) hours as needed for nausea or vomiting.    . saccharomyces boulardii (FLORASTOR) 250 MG capsule Take 250 mg by mouth 2 (two) times daily.    [name] sulfamethoxazole-trimethoprim (BACTRIM,SEPTRA) 400-80 MG per tablet Take 1 tablet by mouth every Monday, Wednesday, and Friday.     . tacrolimus (PROGRAF) 1 MG capsule Take 2-3 mg by mouth 2 (two) times daily. 3 mg in the morning, 2 mg in the evening    . XTAMPZA ER 9 MG C12A Take 9 mg by mouth every 12 (twelve) hours.  0   No facility-administered medications prior to visit.       Past Medical History:  Diagnosis Date  . 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 Arthritis    "I think I do; just in my fingers & my hands"  . Blood transfusion   . Cataract   . Chronic pain   . Depression    Patient states he has never been depressed.  . Diabetes mellitus without complication ([place])    no since pancreas transplant  . Dialysis patient [facility]) 04/18/12   "[facility]; T[place], [place], [place]"  . Edema   . ESRD (end stage renal disease) on dialysis ([place])   . Gastroparesis   . Gastropathy   . GERD (gastroesophageal reflux disease)   . Headache(784.0)   . Hypertension       Review of Systems  Constitutional: Negative for chills and fever.  Eyes:       Negative for changes in vision  Respiratory: Negative for cough, chest tightness and wheezing.   Cardiovascular: Negative for chest pain, palpitations and leg swelling.  Neurological: Negative for dizziness, weakness and light-headedness.      Objective:    BP (!) 172/108 (BP Location: Left Arm, Patient Position: Sitting, Cuff Size: Normal)   Pulse 84   Temp 98.6 F (37 C) (Oral)   Resp 16   Ht 6\' 1"  (1.854 m)   Wt 180 lb (81.6 kg)   SpO2 97%   BMI 23.75 kg/m  Nursing note and vital signs reviewed.  Physical Exam  Constitutional: He is oriented to person, place, and time. He appears well-developed and well-nourished. No distress.  Bilateral below knee amputation    Cardiovascular: Normal rate, regular rhythm and normal heart sounds.   Pulmonary/Chest: Effort normal and breath sounds normal.  Neurological: He is alert and oriented to person, place, and time.  Skin: Skin is warm and dry.  Psychiatric: He has a normal mood and affect. His behavior is normal. Judgment and thought content normal.       Assessment & Plan:   Problem List Items Addressed This Visit      Cardiovascular and Mediastinum   Hypertension    Blood pressure elevated above goal 140/90 with current medication regimen and no adverse side effects or headaches. Encourage continue to monitor blood pressure at home and follow low-sodium diet. Continue current dosage of losartan. Blood pressure remains elevated consider additional medications to lower blood pressure.        Other   S/P bilateral BKA (below knee amputation) ([facility]) - Primary (Chronic)    Normal wear and tear noted in linings and supplies. No skin breakdown or pain currently. Written prescription for supplies provided.       Other Visit Diagnoses    Need for influenza vaccination       Relevant Orders   Flu Vaccine QUAD 36+ mos IM (Completed)       I am having Mr. Hardenbrook maintain his mycophenolate, tacrolimus, predniSONE, sulfamethoxazole-trimethoprim, aspirin EC, ondansetron, losartan, promethazine, saccharomyces boulardii, omeprazole, metoCLOPramide, XTAMPZA ER, and OxyCODONE HCl.   Meds ordered this encounter  Medications  . OxyCODONE HCl 15 MG TABA    Sig: Take 15 mg by mouth 4 (four) times daily.     Follow-up: Return in about 1 month (around 09/22/2017), or if symptoms worsen or fail to improve.  Mauricio Po, FNP

## 2017-08-22 NOTE — Assessment & Plan Note (Signed)
Normal wear and tear noted in linings and supplies. No skin breakdown or pain currently. Written prescription for supplies provided.

## 2017-08-22 NOTE — Patient Instructions (Signed)
Thank you for choosing [facility].  SUMMARY AND INSTRUCTIONS:  Good to see you!  Please continue to take your medications as prescribed.   Follow up:  If your symptoms worsen or fail to improve, please contact our office for further instruction, or in case of emergency go directly to the emergency room at the closest medical facility.

## 2017-08-22 NOTE — Assessment & Plan Note (Signed)
Blood pressure elevated above goal 140/90 with current medication regimen and no adverse side effects or headaches. Encourage continue to monitor blood pressure at home and follow low-sodium diet. Continue current dosage of losartan. Blood pressure remains elevated consider additional medications to lower blood pressure.

## 2017-10-10 ENCOUNTER — Telehealth: Payer: Self-pay | Admitting: Family

## 2017-10-10 NOTE — Telephone Encounter (Signed)
[employer] needed OV notes faxed over. They have been faxed too. [phone]

## 2017-10-25 ENCOUNTER — Emergency Department (HOSPITAL_COMMUNITY): Payer: Medicare Other

## 2017-10-25 ENCOUNTER — Encounter (HOSPITAL_COMMUNITY): Payer: Self-pay

## 2017-10-25 ENCOUNTER — Inpatient Hospital Stay (HOSPITAL_COMMUNITY)
Admission: EM | Admit: 2017-10-25 | Discharge: 2017-10-28 | DRG: 392 | Disposition: A | Discharge status: Home / Self Care | Payer: Medicare Other | Attending: Internal Medicine | Admitting: Internal Medicine

## 2017-10-25 DIAGNOSIS — D899 Disorder involving the immune mechanism, unspecified: Secondary | ICD-10-CM

## 2017-10-25 DIAGNOSIS — K920 Hematemesis: Secondary | ICD-10-CM | POA: Diagnosis present

## 2017-10-25 DIAGNOSIS — Z7982 Long term (current) use of aspirin: Secondary | ICD-10-CM

## 2017-10-25 DIAGNOSIS — Z89512 Acquired absence of left leg below knee: Secondary | ICD-10-CM

## 2017-10-25 DIAGNOSIS — Z79899 Other long term (current) drug therapy: Secondary | ICD-10-CM

## 2017-10-25 DIAGNOSIS — K3184 Gastroparesis: Principal | ICD-10-CM | POA: Diagnosis present

## 2017-10-25 DIAGNOSIS — N183 Chronic kidney disease, stage 3 unspecified: Secondary | ICD-10-CM | POA: Diagnosis present

## 2017-10-25 DIAGNOSIS — R9431 Abnormal electrocardiogram [ECG] [EKG]: Secondary | ICD-10-CM | POA: Diagnosis present

## 2017-10-25 DIAGNOSIS — R531 Weakness: Secondary | ICD-10-CM | POA: Diagnosis present

## 2017-10-25 DIAGNOSIS — I160 Hypertensive urgency: Secondary | ICD-10-CM | POA: Diagnosis present

## 2017-10-25 DIAGNOSIS — D849 Immunodeficiency, unspecified: Secondary | ICD-10-CM | POA: Diagnosis present

## 2017-10-25 DIAGNOSIS — I10 Essential (primary) hypertension: Secondary | ICD-10-CM | POA: Diagnosis present

## 2017-10-25 DIAGNOSIS — Z940 Kidney transplant status: Secondary | ICD-10-CM

## 2017-10-25 DIAGNOSIS — I248 Other forms of acute ischemic heart disease: Secondary | ICD-10-CM | POA: Diagnosis present

## 2017-10-25 DIAGNOSIS — Z79891 Long term (current) use of opiate analgesic: Secondary | ICD-10-CM

## 2017-10-25 DIAGNOSIS — Z89511 Acquired absence of right leg below knee: Secondary | ICD-10-CM

## 2017-10-25 DIAGNOSIS — F17200 Nicotine dependence, unspecified, uncomplicated: Secondary | ICD-10-CM | POA: Diagnosis present

## 2017-10-25 DIAGNOSIS — I129 Hypertensive chronic kidney disease with stage 1 through stage 4 chronic kidney disease, or unspecified chronic kidney disease: Secondary | ICD-10-CM | POA: Diagnosis present

## 2017-10-25 DIAGNOSIS — Z9483 Pancreas transplant status: Secondary | ICD-10-CM

## 2017-10-25 DIAGNOSIS — K922 Gastrointestinal hemorrhage, unspecified: Secondary | ICD-10-CM | POA: Diagnosis not present

## 2017-10-25 DIAGNOSIS — K219 Gastro-esophageal reflux disease without esophagitis: Secondary | ICD-10-CM | POA: Diagnosis present

## 2017-10-25 DIAGNOSIS — Z7952 Long term (current) use of systemic steroids: Secondary | ICD-10-CM

## 2017-10-25 LAB — TYPE AND SCREEN
ABO/RH(D): O POS
ANTIBODY SCREEN: NEGATIVE

## 2017-10-25 LAB — CBC
HEMATOCRIT: 52.5 % — AB (ref 39.0–52.0)
HEMOGLOBIN: 17.2 g/dL — AB (ref 13.0–17.0)
MCH: 30.3 pg (ref 26.0–34.0)
MCHC: 32.8 g/dL (ref 30.0–36.0)
MCV: 92.4 fL (ref 78.0–100.0)
Platelets: 262 10*3/uL (ref 150–400)
RBC: 5.68 MIL/uL (ref 4.22–5.81)
RDW: 14.6 % (ref 11.5–15.5)
WBC: 6.1 10*3/uL (ref 4.0–10.5)

## 2017-10-25 LAB — COMPREHENSIVE METABOLIC PANEL
ALT: 15 U/L — ABNORMAL LOW (ref 17–63)
ANION GAP: 11 (ref 5–15)
AST: 20 U/L (ref 15–41)
Albumin: 4.1 g/dL (ref 3.5–5.0)
Alkaline Phosphatase: 92 U/L (ref 38–126)
BUN: 15 mg/dL (ref 6–20)
CHLORIDE: 108 mmol/L (ref 101–111)
CO2: 23 mmol/L (ref 22–32)
Calcium: 9.5 mg/dL (ref 8.9–10.3)
Creatinine, Ser: 1.59 mg/dL — ABNORMAL HIGH (ref 0.61–1.24)
GFR, EST NON AFRICAN AMERICAN: 52 mL/min — AB (ref >60)
Glucose, Bld: 112 mg/dL — ABNORMAL HIGH (ref 65–99)
POTASSIUM: 3.9 mmol/L (ref 3.5–5.1)
Sodium: 142 mmol/L (ref 135–145)
TOTAL PROTEIN: 7.8 g/dL (ref 6.5–8.1)
Total Bilirubin: 0.6 mg/dL (ref 0.3–1.2)

## 2017-10-25 LAB — APTT: aPTT: 40 seconds — ABNORMAL HIGH (ref 24–36)

## 2017-10-25 LAB — LIPASE, BLOOD: LIPASE: 58 U/L — AB (ref 11–51)

## 2017-10-25 LAB — ETHANOL: Alcohol, Ethyl (B): <10 mg/dL (ref <10)

## 2017-10-25 LAB — I-STAT TROPONIN, ED: Troponin i, poc: 0.01 ng/mL (ref 0.00–0.08)

## 2017-10-25 LAB — PROTIME-INR
INR: 1
PROTHROMBIN TIME: 13.1 s (ref 11.4–15.2)

## 2017-10-25 MED ORDER — ONDANSETRON HCL 4 MG/2ML IJ SOLN
4.0000 mg | Freq: Once | INTRAMUSCULAR | Status: AC
  Administered 2017-10-25: 4 mg via INTRAVENOUS
  Filled 2017-10-25: qty 2

## 2017-10-25 MED ORDER — SODIUM CHLORIDE 0.9 % IV BOLUS (SEPSIS)
1000.0000 mL | Freq: Once | INTRAVENOUS | Status: AC
  Administered 2017-10-25: 1000 mL via INTRAVENOUS

## 2017-10-25 MED ORDER — PANTOPRAZOLE SODIUM 40 MG IV SOLR
40.0000 mg | Freq: Once | INTRAVENOUS | Status: AC
  Administered 2017-10-25: 40 mg via INTRAVENOUS
  Filled 2017-10-25: qty 40

## 2017-10-25 NOTE — ED Triage Notes (Signed)
Patient complains of acute onset of generalized abdominal pain today with vomiting blood, no diarrhea. Alert and oriented, states that he has had same in past that required admission

## 2017-10-25 NOTE — ED Provider Notes (Signed)
[facility] EMERGENCY DEPARTMENT Provider Note   CSN: [id number] Arrival date & time: 10/25/17  1803     History   Chief Complaint Chief Complaint  Patient presents with  . Hematemesis    HPI [name] is a 41 y.o. male.  HPI   [name] is a 41 y.o. male, with a history of renal and pancreatic transplant, BKA, blood transfusion, previous dialysis, and gastroparesis, presenting to the ED with abdominal pain beginning around 4 PM today accompanied by reported hematemesis.  Patient indicates his abdominal pain is epigastric and periumbilical, 3/29, cannot give a description, and nonradiating.  He has had too many episodes of vomiting to count.  Denies fever/chills, diarrhea/constipation, hematochezia/melena, CP, SOB, or any other complaints.  States he has had this issue before and he was told it may be due to dehydration.  Denies anticoagulation.  States he underwent a renal and pancreatic transplant 4 years ago, is still on Prograf, and has been compliant.  Also endorses compliance with his antihypertensive medications, last dose last night.   Past Medical History:  Diagnosis Date  . 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 Arthritis    "I think I do; just in my fingers & my hands"  . Blood transfusion   . Cataract   . Chronic pain   . Depression    Patient states he has never been depressed.  . Diabetes mellitus without complication ([place])    no since pancreas transplant  . Dialysis patient [facility]) 04/18/12   "[facility]; Tues, [place], [place]"  . Edema   . ESRD (end stage renal disease) on dialysis ([place])   . Gastroparesis   . Gastropathy   . GERD (gastroesophageal reflux disease)   . Headache(784.0)   . Hypertension     Patient Active Problem List   Diagnosis Date Noted  . CKD (chronic kidney disease), stage III ([place]) 10/26/2017  . Abnormal EKG 10/26/2017  . Hypertension 08/22/2017  . Acute GI bleeding 07/17/2017  .  Hypertensive urgency 12/29/2016  . Hematemesis   . Healthcare maintenance 08/26/2016  . Back pain 12/15/2015  . History of diabetes mellitus 02/20/2015  . Weakness of left upper extremity 07/03/2014  . Impaired mobility and activities of daily living 07/03/2014  . Sternoclavicular joint pain 03/04/2014  . Renal transplant, status post 07/19/2013  . History of pancreas transplant ([place]) 07/19/2013  . Immunosuppressed status ([place]) 06/27/2013  . H/O pancreas transplant ([place]) 06/27/2013  . Diabetic gastroparesis associated with type 1 diabetes mellitus ([place]) 05/08/2013  . BKA stump complication ([place]) 92/42/6834  . S/P bilateral BKA (below knee amputation) ([place]) 08/17/2012  . Metabolic bone disease 19/62/2297  . ESRD (end stage renal disease) ([place]) 11/17/2009  . GERD 03/14/2007  . HLD (hyperlipidemia) 02/22/2007  . Major depressive disorder, recurrent episode ([place]) 02/22/2007  . POST TRAUMATIC STRESS DISORDER 02/22/2007  . 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 AV FISTULA PLACEMENT  08/2011   left upper arm  . BELOW KNEE LEG AMPUTATION  "it's been awhile"   bilaterally  . CATARACT EXTRACTION  ~ 2011   right  . COMBINED KIDNEY-PANCREAS TRANSPLANT    . ESOPHAGOGASTRODUODENOSCOPY N/A 12/30/2016   Procedure: ESOPHAGOGASTRODUODENOSCOPY (EGD);  Surgeon: Carol Ada, MD;  Location: [facility] ENDOSCOPY;  Service: Endoscopy;  Laterality: N/A;       Home Medications    Prior to Admission medications   Medication Sig Start Date End  Date Taking? Authorizing Provider  aspirin EC 81 MG tablet Take 81 mg by mouth daily.   Yes [provider]  losartan (COZAAR) 100 MG tablet Take 100 mg by mouth daily.  09/08/16  Yes [provider]  metoCLOPramide (REGLAN) 10 MG tablet Take 1 tablet (10 mg total) by mouth 4 (four) times daily. Take 1tab 20-30 minutes before breakfast, lunch, dinner and at bedtime 03/01/17  Yes Levin Erp, [place]    mycophenolate (MYFORTIC) 180 MG EC tablet Take 360 mg by mouth 2 (two) times daily.  07/06/13  Yes [provider]  omeprazole (PRILOSEC) 40 MG capsule Take 1 capsule (40 mg total) by mouth daily. Take 1 cap 30-60 minutes before breakfast 03/01/17  Yes Lemmon, Lavone Nian, PA  ondansetron (ZOFRAN ODT) 4 MG disintegrating tablet Take 1 tablet (4 mg total) by mouth every 8 (eight) hours as needed for nausea or vomiting. 01/08/15  Yes Gareth Morgan, MD  OxyCODONE HCl 15 MG TABA Take 15 mg by mouth 4 (four) times daily.   Yes [provider]  predniSONE (DELTASONE) 5 MG tablet Take 5 mg by mouth daily.  06/20/13  Yes [provider]  promethazine (PHENERGAN) 25 MG tablet Take 25 mg by mouth every 6 (six) hours as needed for nausea or vomiting.   Yes [provider]  saccharomyces boulardii (FLORASTOR) 250 MG capsule Take 250 mg by mouth 2 (two) times daily.   Yes [provider]  sulfamethoxazole-trimethoprim (BACTRIM,SEPTRA) 400-80 MG per tablet Take 1 tablet by mouth every Monday, Wednesday, and Friday.    Yes [provider]  tacrolimus (PROGRAF) 1 MG capsule Take 2-3 mg by mouth 2 (two) times daily. 3 mg in the morning, 2 mg in the evening 07/16/13  Yes [provider]    Family History Family History  Problem Relation Age of Onset  . Diabetes Other   . Hypertension Other   . Hypertension Mother   . Diabetes Mother   . Kidney disease Mother   . Diabetes Maternal Grandmother   . Diabetes Paternal Grandmother   . Colon cancer Neg Hx   . Esophageal cancer Neg Hx   . Rectal cancer Neg Hx   . Stomach cancer Neg Hx     Social History Social History  Substance Use Topics  . Smoking status: Current Some Day Smoker  . Smokeless tobacco: Never Used  . Alcohol use No     Allergies   Lisinopril and Amlodipine   Review of Systems Review of Systems  Constitutional: Negative for chills, diaphoresis and fever.  Respiratory:  Negative for shortness of breath.   Cardiovascular: Negative for chest pain.  Gastrointestinal: Positive for abdominal pain, nausea and vomiting. Negative for blood in stool and diarrhea.       Hematemesis  Genitourinary: Negative for dysuria and hematuria.  All other systems reviewed and are negative.    Physical Exam Updated Vital Signs BP (!) 207/106   Pulse (!) 101   Temp 98.1 F (36.7 C) (Oral)   Resp (!) 23   SpO2 100%   Physical Exam  Constitutional: He appears well-developed and well-nourished. No distress.  HENT:  Head: Normocephalic and atraumatic.  Eyes: Conjunctivae are normal.  Neck: Neck supple.  Cardiovascular: Normal rate, regular rhythm, normal heart sounds and intact distal pulses.   Pulmonary/Chest: Effort normal and breath sounds normal. No respiratory distress.  Abdominal: Soft. There is tenderness in the epigastric area and periumbilical area. There is guarding.  Musculoskeletal:  He exhibits no edema.  Lymphadenopathy:    He has no cervical adenopathy.  Neurological: He is alert.  Skin: Skin is warm and dry. He is not diaphoretic.  Psychiatric: He has a normal mood and affect. His behavior is normal.  Nursing note and vitals reviewed.    ED Treatments / Results  Labs (all labs ordered are listed, but only abnormal results are displayed) Labs Reviewed  COMPREHENSIVE METABOLIC PANEL - Abnormal; Notable for the following:       Result Value   Glucose, Bld 112 (*)    Creatinine, Ser 1.59 (*)    ALT 15 (*)    GFR calc non Af Amer 52 (*)    All other components within normal limits  CBC - Abnormal; Notable for the following:    Hemoglobin 17.2 (*)    HCT 52.5 (*)    All other components within normal limits  LIPASE, BLOOD - Abnormal; Notable for the following:    Lipase 58 (*)    All other components within normal limits  APTT - Abnormal; Notable for the following:    aPTT 40 (*)    All other components within normal limits  ETHANOL    PROTIME-INR  CBC  CBC  CBC  CBC  TACROLIMUS LEVEL  BASIC METABOLIC PANEL  TROPONIN I  TROPONIN I  TROPONIN I  HEMOGLOBIN A1C  LIPID PANEL  RAPID URINE DRUG SCREEN, HOSP PERFORMED  I-STAT TROPONIN, ED  POC OCCULT BLOOD, ED  TYPE AND SCREEN   BUN  Date Value Ref Range Status  10/25/2017 15 6 - 20 mg/dL Final  07/17/2017 15 6 - 20 mg/dL Final  05/14/2017 16 6 - 20 mg/dL Final  01/02/2017 13 6 - 20 mg/dL Final   Creat  Date Value Ref Range Status  05/30/2014 1.20 0.50 - 1.35 mg/dL Final   Creatinine, Ser  Date Value Ref Range Status  10/25/2017 1.59 (H) 0.61 - 1.24 mg/dL Final  07/17/2017 1.63 (H) 0.61 - 1.24 mg/dL Final  05/14/2017 1.50 (H) 0.61 - 1.24 mg/dL Final  01/02/2017 1.58 (H) 0.61 - 1.24 mg/dL Final     EKG  EKG Interpretation  Date/Time:  Wednesday October 25 2017 20:11:17 EDT Ventricular Rate:  103 PR Interval:  166 QRS Duration: 82 QT Interval:  306 QTC Calculation: 401 R Axis:   93 Text Interpretation:  Sinus tachycardia Biatrial enlargement Borderline right axis deviation Probable anteroseptal infarct, old Abnormal T, consider ischemia, inferior leads st depression t wave inversion in inferior leads new from last ekg earlier tonight Reconfirmed by Pattricia Boss [phone]) on 10/25/2017 10:05:24 PM       Radiology Ct Abdomen Pelvis Wo Contrast  Result Date: 10/25/2017 CLINICAL DATA:  Generalized abdominal pain with hematemesis today. EXAM: CT ABDOMEN AND PELVIS WITHOUT CONTRAST TECHNIQUE: Multidetector CT imaging of the abdomen and pelvis was performed following the standard protocol without IV contrast. COMPARISON:  CT 10/11/2016 FINDINGS: Lower chest: Subsegmental scarring or atelectasis in the right middle lobe. Top-normal size heart without pericardial effusion. Small hiatal hernia. Hepatobiliary: The physiologically distended gallbladder without stones. The unenhanced liver demonstrates no biliary dilatation or definite mass. Pancreas:  Unremarkable and nonacute. Spleen: Negative Adrenals/Urinary Tract: Normal bilateral adrenal glands. Bilateral extensive renovascular calcifications of both kidneys with mild atrophy of the native kidneys. No significant cortical scarring or thinning. No nephrolithiasis nor obstructive uropathy. Right-sided transplanted pelvic kidney is noted. The urinary bladder is physiologically distended. Stomach/Bowel: Stomach is within normal limits. No evidence of acute appendicitis.  No evidence of bowel wall thickening, distention, or inflammatory changes. There is sigmoid diverticulosis. Vascular/Lymphatic: Scattered aortic and moderate common iliac calcific atherosclerosis. Moderate atherosclerosis of the SMA and IMA. No adenopathy. Reproductive: Mildly enlarged prostate up to 5.9 cm. Normal seminal vesicles. Other: No abdominal wall hernia or abnormality. No abdominopelvic ascites. Musculoskeletal: No acute or significant osseous findings. IMPRESSION: 1. Mild native renal atrophy with extensive renovascular calcifications. Stable right transplanted pelvic kidney. No obstructive uropathy. 2. Sigmoid diverticulosis without acute diverticulitis. Electronically Signed   By: Ashley Royalty M.D.   On: 10/25/2017 22:57    Procedures NG placement Date/Time: 10/25/2017 10:48 PM Performed by: Lorayne Bender Authorized by: Arlean Hopping C  Consent: Verbal consent obtained. Risks and benefits: risks, benefits and alternatives were discussed Consent given by: patient Patient understanding: patient states understanding of the procedure being performed Patient consent: the patient's understanding of the procedure matches consent given Procedure consent: procedure consent matches procedure scheduled Patient identity confirmed: verbally with patient and provided demographic data Local anesthesia used: no  Anesthesia: Local anesthesia used: no  Sedation: Patient sedated: no Patient tolerance: Patient tolerated the procedure  well with no immediate complications Comments: 29 French tube placed in the right nare using standard procedure.  Injected air was able to be auscultated over the epigastrium.  Gastric contents returned when suction was applied.  Evidence of blood in suctioned contents.  Patient placed on intermittent suction.    (including critical care time)   Medications Ordered in ED Medications  pantoprazole (PROTONIX) 80 mg in sodium chloride 0.9 % 250 mL (0.32 mg/mL) infusion (8 mg/hr Intravenous New Bag/Given 10/26/17 0120)  pantoprazole (PROTONIX) injection 40 mg (not administered)  hydrALAZINE (APRESOLINE) injection 5 mg (not administered)  ondansetron (ZOFRAN) injection 4 mg (4 mg Intravenous Given 10/26/17 0120)  LORazepam (ATIVAN) injection 0.5 mg (0.5 mg Intravenous Given 10/26/17 0105)  losartan (COZAAR) tablet 100 mg (0 mg Oral Hold 10/26/17 0141)  sulfamethoxazole-trimethoprim (BACTRIM,SEPTRA) 400-80 MG per tablet 1 tablet (not administered)  mycophenolate (MYFORTIC) EC tablet 360 mg (0 mg Oral Hold 10/26/17 0142)  predniSONE (DELTASONE) tablet 5 mg (not administered)  tacrolimus (PROGRAF) capsule 2 mg (0 mg Oral Hold 10/26/17 0142)  nicotine (NICODERM CQ - dosed in mg/24 hours) patch 21 mg (not administered)  acetaminophen (TYLENOL) tablet 650 mg (not administered)    Or  acetaminophen (TYLENOL) suppository 650 mg (not administered)  zolpidem (AMBIEN) tablet 5 mg (not administered)  0.9 %  sodium chloride infusion (1,000 mLs Intravenous New Bag/Given 10/26/17 0120)  tacrolimus (PROGRAF) capsule 3 mg (not administered)  morphine 4 MG/ML injection 2 mg (not administered)  nitroGLYCERIN (NITROSTAT) SL tablet 0.4 mg (not administered)  sodium chloride 0.9 % bolus 1,000 mL (0 mLs Intravenous Stopped 10/26/17 0130)  pantoprazole (PROTONIX) injection 40 mg (40 mg Intravenous Given 10/25/17 2148)  ondansetron (ZOFRAN) injection 4 mg (4 mg Intravenous Given 10/25/17 2158)  pantoprazole (PROTONIX)  injection 40 mg (40 mg Intravenous Given 10/26/17 0105)  hydrALAZINE (APRESOLINE) injection 10 mg (10 mg Intravenous Given 10/26/17 0119)     Initial Impression / Assessment and Plan / ED Course  I have reviewed the triage vital signs and the nursing notes.  Pertinent labs & imaging results that were available during my care of the patient were reviewed by me and considered in my medical decision making (see chart for details).  Clinical Course as of Oct 26 153  Thu Oct 26, 2017  0018 Spoke with Dr. Blaine Hamper, hospitalist. Agrees to admit  the patient.  [SJ]    Clinical Course User Index [SJ] Katja Blue C, PA-C    Patient presents with complaint of hematemesis.  Evidence of blood in gastric products.  Hemoglobin stable. Patient is nontoxic appearing, afebrile, not tachycardic on my exam, not tachypneic, not hypotensive, and maintains SPO2 of 100% on room air.  CT of the abdomen performed without contrast due to renal transplant history.  No acute abnormalities on CT.  Dr. Jeanell Sparrow spoke with Dr. Carlean Purl who agreed to evaluate the patient in the morning.  Patient admitted for upper GI bleed, hemodynamically stable at time of admission.  Findings and plan of care discussed with Pattricia Boss, MD. Dr. Jeanell Sparrow personally evaluated and examined this patient.  Vitals:   10/25/17 1826 10/25/17 1915  BP: (!) 219/114 (!) 207/106  Pulse: (!) 102 (!) 101  Resp: 20 (!) 23  Temp: 98.1 F (36.7 C)   TempSrc: Oral   SpO2: 100% 100%   Vitals:   10/25/17 2200 10/25/17 2300 10/25/17 2345 10/26/17 0045  BP: (!) 216/98 (!) 204/111 (!) 212/112 (!) 211/87  Pulse: (!) 101 99 99 98  Resp: (!) 26  17 (!) 22  Temp:      TempSrc:      SpO2: 100% 100% 99% 94%     Final Clinical Impressions(s) / ED Diagnoses   Final diagnoses:  Upper GI bleed    New Prescriptions New Prescriptions   No medications on file     Layla Maw 10/26/17 0155    Pattricia Boss, MD 10/29/17 0001

## 2017-10-25 NOTE — ED Notes (Signed)
ED Provider at bedside. 

## 2017-10-25 NOTE — ED Notes (Addendum)
PT unable to stand as he has no legs.Marland KitchenMarland KitchenMarland Kitchen

## 2017-10-26 DIAGNOSIS — K3184 Gastroparesis: Principal | ICD-10-CM

## 2017-10-26 DIAGNOSIS — R9431 Abnormal electrocardiogram [ECG] [EKG]: Secondary | ICD-10-CM

## 2017-10-26 DIAGNOSIS — K920 Hematemesis: Secondary | ICD-10-CM | POA: Diagnosis not present

## 2017-10-26 DIAGNOSIS — Z9483 Pancreas transplant status: Secondary | ICD-10-CM

## 2017-10-26 DIAGNOSIS — Z79891 Long term (current) use of opiate analgesic: Secondary | ICD-10-CM | POA: Diagnosis not present

## 2017-10-26 DIAGNOSIS — K922 Gastrointestinal hemorrhage, unspecified: Secondary | ICD-10-CM | POA: Diagnosis present

## 2017-10-26 DIAGNOSIS — K219 Gastro-esophageal reflux disease without esophagitis: Secondary | ICD-10-CM | POA: Diagnosis present

## 2017-10-26 DIAGNOSIS — D899 Disorder involving the immune mechanism, unspecified: Secondary | ICD-10-CM

## 2017-10-26 DIAGNOSIS — I248 Other forms of acute ischemic heart disease: Secondary | ICD-10-CM | POA: Diagnosis present

## 2017-10-26 DIAGNOSIS — Z7982 Long term (current) use of aspirin: Secondary | ICD-10-CM | POA: Diagnosis not present

## 2017-10-26 DIAGNOSIS — Z940 Kidney transplant status: Secondary | ICD-10-CM | POA: Diagnosis not present

## 2017-10-26 DIAGNOSIS — N183 Chronic kidney disease, stage 3 unspecified: Secondary | ICD-10-CM | POA: Diagnosis present

## 2017-10-26 DIAGNOSIS — I160 Hypertensive urgency: Secondary | ICD-10-CM | POA: Diagnosis present

## 2017-10-26 DIAGNOSIS — R531 Weakness: Secondary | ICD-10-CM | POA: Diagnosis present

## 2017-10-26 DIAGNOSIS — I159 Secondary hypertension, unspecified: Secondary | ICD-10-CM | POA: Diagnosis not present

## 2017-10-26 DIAGNOSIS — Z89511 Acquired absence of right leg below knee: Secondary | ICD-10-CM | POA: Diagnosis not present

## 2017-10-26 DIAGNOSIS — Z7952 Long term (current) use of systemic steroids: Secondary | ICD-10-CM | POA: Diagnosis not present

## 2017-10-26 DIAGNOSIS — I129 Hypertensive chronic kidney disease with stage 1 through stage 4 chronic kidney disease, or unspecified chronic kidney disease: Secondary | ICD-10-CM | POA: Diagnosis present

## 2017-10-26 DIAGNOSIS — F17200 Nicotine dependence, unspecified, uncomplicated: Secondary | ICD-10-CM | POA: Diagnosis present

## 2017-10-26 DIAGNOSIS — Z79899 Other long term (current) drug therapy: Secondary | ICD-10-CM | POA: Diagnosis not present

## 2017-10-26 DIAGNOSIS — Z89512 Acquired absence of left leg below knee: Secondary | ICD-10-CM | POA: Diagnosis not present

## 2017-10-26 LAB — CBC
HEMATOCRIT: 50.1 % (ref 39.0–52.0)
HEMATOCRIT: 49.1 % (ref 39.0–52.0)
HEMATOCRIT: 49.9 % (ref 39.0–52.0)
HEMOGLOBIN: 15.9 g/dL (ref 13.0–17.0)
HEMOGLOBIN: 16.3 g/dL (ref 13.0–17.0)
Hemoglobin: 16.5 g/dL (ref 13.0–17.0)
MCH: 30.5 pg (ref 26.0–34.0)
MCH: 30.2 pg (ref 26.0–34.0)
MCH: 30.5 pg (ref 26.0–34.0)
MCHC: 32.9 g/dL (ref 30.0–36.0)
MCHC: 32.4 g/dL (ref 30.0–36.0)
MCHC: 32.7 g/dL (ref 30.0–36.0)
MCV: 92.6 fL (ref 78.0–100.0)
MCV: 93.2 fL (ref 78.0–100.0)
MCV: 93.4 fL (ref 78.0–100.0)
PLATELETS: 258 10*3/uL (ref 150–400)
Platelets: 264 10*3/uL (ref 150–400)
Platelets: 245 10*3/uL (ref 150–400)
RBC: 5.41 MIL/uL (ref 4.22–5.81)
RBC: 5.27 MIL/uL (ref 4.22–5.81)
RBC: 5.34 MIL/uL (ref 4.22–5.81)
RDW: 14.9 % (ref 11.5–15.5)
RDW: 15 % (ref 11.5–15.5)
RDW: 15 % (ref 11.5–15.5)
WBC: 7 10*3/uL (ref 4.0–10.5)
WBC: 7.6 10*3/uL (ref 4.0–10.5)
WBC: 9.6 10*3/uL (ref 4.0–10.5)

## 2017-10-26 LAB — TROPONIN I: Troponin I: <0.03 ng/mL (ref <0.03)

## 2017-10-26 LAB — MRSA PCR SCREENING: MRSA by PCR: NEGATIVE

## 2017-10-26 LAB — LIPID PANEL
Cholesterol: 171 mg/dL (ref 0–200)
HDL: 53 mg/dL (ref >40)
LDL CALC: 106 mg/dL — AB (ref 0–99)
Total CHOL/HDL Ratio: 3.2 RATIO
Triglycerides: 58 mg/dL (ref <150)
VLDL: 12 mg/dL (ref 0–40)

## 2017-10-26 LAB — RAPID URINE DRUG SCREEN, HOSP PERFORMED
AMPHETAMINES: NOT DETECTED
BENZODIAZEPINES: NOT DETECTED
Barbiturates: NOT DETECTED
COCAINE: NOT DETECTED
OPIATES: NOT DETECTED
Tetrahydrocannabinol: POSITIVE — AB

## 2017-10-26 LAB — BASIC METABOLIC PANEL
ANION GAP: 12 (ref 5–15)
BUN: 14 mg/dL (ref 6–20)
CALCIUM: 8.9 mg/dL (ref 8.9–10.3)
CO2: 20 mmol/L — AB (ref 22–32)
Chloride: 110 mmol/L (ref 101–111)
Creatinine, Ser: 1.5 mg/dL — ABNORMAL HIGH (ref 0.61–1.24)
GFR, EST NON AFRICAN AMERICAN: 56 mL/min — AB (ref >60)
Glucose, Bld: 97 mg/dL (ref 65–99)
Potassium: 3.9 mmol/L (ref 3.5–5.1)
SODIUM: 142 mmol/L (ref 135–145)

## 2017-10-26 LAB — HEMOGLOBIN A1C
HEMOGLOBIN A1C: 5.4 % (ref 4.8–5.6)
Mean Plasma Glucose: 108.28 mg/dL

## 2017-10-26 LAB — POC OCCULT BLOOD, ED: FECAL OCCULT BLD: NEGATIVE

## 2017-10-26 MED ORDER — METOCLOPRAMIDE HCL 5 MG/ML IJ SOLN
5.0000 mg | Freq: Three times a day (TID) | INTRAMUSCULAR | Status: DC
  Administered 2017-10-26 – 2017-10-27 (×4): 5 mg via INTRAVENOUS
  Filled 2017-10-26 (×4): qty 2

## 2017-10-26 MED ORDER — MORPHINE SULFATE (PF) 4 MG/ML IV SOLN
2.0000 mg | INTRAVENOUS | Status: DC | PRN
  Administered 2017-10-26 – 2017-10-28 (×7): 2 mg via INTRAVENOUS
  Filled 2017-10-26 (×7): qty 1

## 2017-10-26 MED ORDER — LORAZEPAM 2 MG/ML IJ SOLN
0.5000 mg | INTRAMUSCULAR | Status: DC | PRN
  Administered 2017-10-26 – 2017-10-27 (×4): 0.5 mg via INTRAVENOUS
  Filled 2017-10-26 (×5): qty 1

## 2017-10-26 MED ORDER — SULFAMETHOXAZOLE-TRIMETHOPRIM 400-80 MG PO TABS
1.0000 | ORAL_TABLET | ORAL | Status: DC
  Administered 2017-10-27: 1 via ORAL
  Filled 2017-10-26: qty 1

## 2017-10-26 MED ORDER — TACROLIMUS 1 MG PO CAPS
2.0000 mg | ORAL_CAPSULE | Freq: Every day | ORAL | Status: DC
  Administered 2017-10-26 – 2017-10-27 (×2): 2 mg via ORAL
  Filled 2017-10-26 (×3): qty 2

## 2017-10-26 MED ORDER — HYDRALAZINE HCL 20 MG/ML IJ SOLN
10.0000 mg | Freq: Four times a day (QID) | INTRAMUSCULAR | Status: DC | PRN
  Administered 2017-10-26 – 2017-10-28 (×5): 10 mg via INTRAVENOUS
  Filled 2017-10-26 (×5): qty 1

## 2017-10-26 MED ORDER — PREDNISONE 10 MG PO TABS
5.0000 mg | ORAL_TABLET | Freq: Every day | ORAL | Status: DC
  Administered 2017-10-26 – 2017-10-28 (×3): 5 mg via ORAL
  Filled 2017-10-26 (×3): qty 1

## 2017-10-26 MED ORDER — ACETAMINOPHEN 650 MG RE SUPP: 650.0000 mg | Freq: Four times a day (QID) | RECTAL | Status: DC | PRN

## 2017-10-26 MED ORDER — PANTOPRAZOLE SODIUM 40 MG IV SOLR
40.0000 mg | Freq: Two times a day (BID) | INTRAVENOUS | Status: DC
  Administered 2017-10-26 – 2017-10-28 (×4): 40 mg via INTRAVENOUS
  Filled 2017-10-26 (×4): qty 40

## 2017-10-26 MED ORDER — NICOTINE 21 MG/24HR TD PT24
21.0000 mg | MEDICATED_PATCH | Freq: Every day | TRANSDERMAL | Status: DC
  Administered 2017-10-28: 21 mg via TRANSDERMAL
  Filled 2017-10-26 (×2): qty 1

## 2017-10-26 MED ORDER — ZOLPIDEM TARTRATE 5 MG PO TABS: 5.0000 mg | ORAL_TABLET | Freq: Every evening | ORAL | Status: DC | PRN

## 2017-10-26 MED ORDER — HYDRALAZINE HCL 20 MG/ML IJ SOLN
5.0000 mg | INTRAMUSCULAR | Status: DC | PRN
  Administered 2017-10-26 (×2): 5 mg via INTRAVENOUS
  Filled 2017-10-26 (×2): qty 1

## 2017-10-26 MED ORDER — SODIUM CHLORIDE 0.9 % IV SOLN
INTRAVENOUS | Status: DC
  Administered 2017-10-26: 1000 mL via INTRAVENOUS
  Administered 2017-10-26 – 2017-10-27 (×2): via INTRAVENOUS
  Administered 2017-10-28: 125 mL/h via INTRAVENOUS

## 2017-10-26 MED ORDER — PANTOPRAZOLE SODIUM 40 MG IV SOLR
40.0000 mg | Freq: Once | INTRAVENOUS | Status: AC
  Administered 2017-10-26: 40 mg via INTRAVENOUS
  Filled 2017-10-26: qty 40

## 2017-10-26 MED ORDER — ACETAMINOPHEN 325 MG PO TABS: 650.0000 mg | ORAL_TABLET | Freq: Four times a day (QID) | ORAL | Status: DC | PRN

## 2017-10-26 MED ORDER — TACROLIMUS 1 MG PO CAPS
3.0000 mg | ORAL_CAPSULE | Freq: Every day | ORAL | Status: DC
  Administered 2017-10-27 – 2017-10-28 (×2): 3 mg via ORAL
  Filled 2017-10-26 (×4): qty 3

## 2017-10-26 MED ORDER — MYCOPHENOLATE SODIUM 180 MG PO TBEC
360.0000 mg | DELAYED_RELEASE_TABLET | Freq: Two times a day (BID) | ORAL | Status: DC
  Administered 2017-10-26 – 2017-10-28 (×4): 360 mg via ORAL
  Filled 2017-10-26 (×7): qty 2

## 2017-10-26 MED ORDER — LOSARTAN POTASSIUM 50 MG PO TABS
100.0000 mg | ORAL_TABLET | Freq: Every day | ORAL | Status: DC
  Administered 2017-10-27: 100 mg via ORAL
  Filled 2017-10-26 (×2): qty 2

## 2017-10-26 MED ORDER — PANTOPRAZOLE SODIUM 40 MG IV SOLR: 40.0000 mg | Freq: Two times a day (BID) | INTRAVENOUS | Status: DC

## 2017-10-26 MED ORDER — SODIUM CHLORIDE 0.9 % IV SOLN
8.0000 mg/h | INTRAVENOUS | Status: DC
  Administered 2017-10-26: 8 mg/h via INTRAVENOUS
  Filled 2017-10-26 (×3): qty 80

## 2017-10-26 MED ORDER — ONDANSETRON HCL 4 MG/2ML IJ SOLN
4.0000 mg | Freq: Three times a day (TID) | INTRAMUSCULAR | Status: DC | PRN
  Administered 2017-10-26 – 2017-10-27 (×4): 4 mg via INTRAVENOUS
  Filled 2017-10-26 (×4): qty 2

## 2017-10-26 MED ORDER — NITROGLYCERIN 0.4 MG SL SUBL: 0.4000 mg | SUBLINGUAL_TABLET | SUBLINGUAL | Status: DC | PRN

## 2017-10-26 MED ORDER — HYDRALAZINE HCL 20 MG/ML IJ SOLN
10.0000 mg | Freq: Once | INTRAMUSCULAR | Status: AC
  Administered 2017-10-26: 10 mg via INTRAVENOUS
  Filled 2017-10-26: qty 1

## 2017-10-26 NOTE — Progress Notes (Signed)
PROGRESS NOTE  ELAINE MIDDLETON LYY:[id number] DOB: 14-Mar-1976 DOA: 10/25/2017 PCP: Golden Circle, FNP  HPI/Recap of past 24 hours: The patient was seen and examined at his bedside with his mother and aunt present in the ED. Reports nausea with hematemesis about 2 cups full prior to admission. Denies abdominal pain, cramping, or diarrhea associated with vomiting. Reports prior episode of hematemesis 2 years ago. Endoscopy was unremarkable at the time. GI consulted..  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 All questions answered to their satisfaction.  Disposition Plan: Will keep another midnight to continue current management.   Consultants:  GI  Procedures:  None  Antimicrobials:  Bactrim  DVT prophylaxis:  SCDs; hold chemical DVT ppx due to suspected GI bleed   Objective: Vitals:   10/26/17 0915 10/26/17 1015 10/26/17 1100 10/26/17 1130  BP: (!) 179/109 (!) 183/99 (!) 144/77 139/79  Pulse: (!) 105 (!) 112 97 92  Resp: (!) 28 (!) 27 (!) 23 (!) 21  Temp:      TempSrc:      SpO2: 99% 95% 100% 100%    Intake/Output Summary (Last 24 hours) at 10/26/17 1218 Last data filed at 10/26/17 1044  Gross per 24 hour  Intake             1000 ml  Output             1100 ml  Net             -100 ml   There were no vitals filed for this visit.  Exam:   General:  41 year old AAM,, well developed well nourished laying in bed appears uncomfortable. No eye contact.  Cardiovascular: RRR with no rubs or gallops  Respiratory: CTA with no wheezes or rhonchi  Abdomen: Non distended, non tender with NBS x 4 quadrants.  Musculoskeletal: Bilateral  below the knee amputation  Skin: No noted open ulcers  Psychiatry: Unable to assess as the patient is minimally interactive.   Data Reviewed: CBC:  Recent Labs Lab 10/25/17 1824 10/26/17 0432  WBC 6.1 7.0  HGB 17.2* 16.5  HCT 52.5* 50.1  MCV 92.4 92.6  PLT 262 127   Basic Metabolic Panel:  Recent Labs Lab 10/25/17 1824 10/26/17 0432  NA 142 142  K 3.9 3.9  CL 108 110  CO2 23 20*  GLUCOSE 112* 97  BUN 15 14  CREATININE 1.59* 1.50*  CALCIUM 9.5 8.9   GFR: CrCl cannot be calculated (Unknown ideal weight.). Liver Function Tests:  Recent Labs Lab 10/25/17 1824  AST 20  ALT 15*  ALKPHOS 92  BILITOT 0.6  PROT 7.8  ALBUMIN 4.1    Recent Labs Lab 10/25/17 2009  LIPASE 58*   No results for input(s): AMMONIA in the last 168 hours. Coagulation Profile:  Recent Labs Lab 10/25/17 2009  INR 1.00   Cardiac Enzymes:  Recent Labs Lab 10/26/17 0432 10/26/17 1038  TROPONINI <0.03 <0.03   BNP (last 3 results) No results for input(s): PROBNP in the last 8760 hours. HbA1C:  Recent Labs  10/26/17 0432  HGBA1C 5.4   CBG: No results for input(s): GLUCAP in the last  168 hours. Lipid Profile:  Recent Labs  10/26/17 0432  CHOL 171  HDL 53  LDLCALC 106*  TRIG 58  CHOLHDL 3.2   Thyroid Function Tests: No results for input(s): TSH, T4TOTAL, FREET4, T3FREE, THYROIDAB in the last 72 hours. Anemia Panel: No results for input(s): VITAMINB12, FOLATE, FERRITIN, TIBC, IRON, RETICCTPCT in the last 72 hours. Urine analysis:    Component Value Date/Time   COLORURINE AMBER (A) 10/11/2016 2328   APPEARANCEUR CLEAR 10/11/2016 2328   LABSPEC 1.026 10/11/2016 2328   PHURINE 6.0 10/11/2016 2328   GLUCOSEU NEGATIVE 10/11/2016 2328   HGBUR NEGATIVE 10/11/2016 2328   HGBUR negative 10/08/2010 0853   BILIRUBINUR MODERATE (A) 10/11/2016 2328   BILIRUBINUR SMALL 12/14/2015 1139   KETONESUR NEGATIVE 10/11/2016 2328   PROTEINUR 30 (A) 10/11/2016 2328    UROBILINOGEN 1.0 12/14/2015 1139   UROBILINOGEN 0.2 09/13/2015 0051   NITRITE NEGATIVE 10/11/2016 2328   LEUKOCYTESUR NEGATIVE 10/11/2016 2328   Sepsis Labs: @LABRCNTIP (procalcitonin:4,lacticidven:4)  )No results found for this or any previous visit (from the past 240 hour(s)).    Studies: Ct Abdomen Pelvis Wo Contrast  Result Date: 10/25/2017 CLINICAL DATA:  Generalized abdominal pain with hematemesis today. EXAM: CT ABDOMEN AND PELVIS WITHOUT CONTRAST TECHNIQUE: Multidetector CT imaging of the abdomen and pelvis was performed following the standard protocol without IV contrast. COMPARISON:  CT 10/11/2016 FINDINGS: Lower chest: Subsegmental scarring or atelectasis in the right middle lobe. Top-normal size heart without pericardial effusion. Small hiatal hernia. Hepatobiliary: The physiologically distended gallbladder without stones. The unenhanced liver demonstrates no biliary dilatation or definite mass. Pancreas: Unremarkable and nonacute. Spleen: Negative Adrenals/Urinary Tract: Normal bilateral adrenal glands. Bilateral extensive renovascular calcifications of both kidneys with mild atrophy of the native kidneys. No significant cortical scarring or thinning. No nephrolithiasis nor obstructive uropathy. Right-sided transplanted pelvic kidney is noted. The urinary bladder is physiologically distended. Stomach/Bowel: Stomach is within normal limits. No evidence of acute appendicitis. No evidence of bowel wall thickening, distention, or inflammatory changes. There is sigmoid diverticulosis. Vascular/Lymphatic: Scattered aortic and moderate common iliac calcific atherosclerosis. Moderate atherosclerosis of the SMA and IMA. No adenopathy. Reproductive: Mildly enlarged prostate up to 5.9 cm. Normal seminal vesicles. Other: No abdominal wall hernia or abnormality. No abdominopelvic ascites. Musculoskeletal: No acute or significant osseous findings. IMPRESSION: 1. Mild native renal atrophy with extensive  renovascular calcifications. Stable right transplanted pelvic kidney. No obstructive uropathy. 2. Sigmoid diverticulosis without acute diverticulitis. Electronically Signed   By: Ashley Royalty M.D.   On: 10/25/2017 22:57    Scheduled Meds: . losartan  100 mg Oral Daily  . metoCLOPramide (REGLAN) injection  5 mg Intravenous Q8H  . mycophenolate  360 mg Oral BID  . nicotine  21 mg Transdermal Daily  . [START ON 10/29/2017] pantoprazole  40 mg Intravenous Q12H  . predniSONE  5 mg Oral Daily  . [START ON 10/27/2017] sulfamethoxazole-trimethoprim  1 tablet Oral Q M,W,F  . tacrolimus  2 mg Oral QHS  . tacrolimus  3 mg Oral Daily    Continuous Infusions: . sodium chloride 1,000 mL (10/26/17 0120)  . pantoprozole (PROTONIX) infusion Stopped (10/26/17 0940)     LOS: 0 days   Assessment and Plan:  Hematemesis, unclear etiology -GI consulted, Dr. Carlean Purl, we appreciate recommendations  -Hemoglobin stable -EGD by Dr. Carlean Purl on 03/22/17 due to dysphagia, which had negative findings. -NPO -IV pantoprazole 40 mg BID -reglan and prn Zofran IV for nausea -Avoid NSAIDs and SQ heparin -Maintain IV access (2 large bore IVs  if possible).  GERD -IV protonix 40 mg BID  Hx of renal transplant and pancreas transplant -continue home immunosuppressant, mycophenolate, prednisone and tacrolimus when NG tube is out -Check tacrolimus level  Hypertensive urgency -Most likely 2/2 to acute distress secondary to severe nausea vomiting and hematemesis  -Prn IV hydralazine, losartan   CKD (chronic kidney disease), stage III (HCC) -Improving, cr 1.50 from 1.59. -Baseline creatinine 1.5-1.6.  -Repeat BMP in the am  Abnormal EKG -The second EKG showed ST depression in inferior leads, which is likely due to demand ischemia secondary to hypertensive urgency.  -Asymptomatic -Troponin negative 3  Below the knee amputation -Fall precaution -PT when stable    Kayleen Memos, MD [facility] Pager [phone]  If 7PM-7AM, please contact night-coverage [email or web address] Password [email or web address] 10/26/2017, 12:18 PM

## 2017-10-26 NOTE — ED Notes (Signed)
Pt anxious and c/o of nausea.  PRN meds given

## 2017-10-26 NOTE — ED Notes (Signed)
Went in to check patient cbg patient refused to let me patient is resting with call bell in reach

## 2017-10-26 NOTE — Consult Note (Signed)
[facility] Consult: 3:18 PM 10/26/2017  LOS: 0 days    Referring Provider: Dr Nevada Crane  Primary Care Physician:  Golden Circle, FNP Primary Gastroenterologist:  Dr. Carlean Purl   Reason for Consultation: Coffee-ground emesis   HPI: [name] is a 41 y.o. male.  Status post pancreatic and renal transplant.  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  Gastroparesis.  GERD.  Hypertension.  Status post bilateral BKA.  Chronic pain. 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  08/2012 gastric emptying study revealed significant delay in emptying suggesting gastroparesis.  99% retention at 60 minutes and 70% retention at 2 hours. 12/30/2016 EGD by Dr. Benson Norway for evaluation of hematemesis.  Hemorrhagic appearing esophagus, otherwise normal study. 03/09/17 barium esophagram was unremarkable. 03/22/17 EGD by Dr. Carlean Purl for evaluation of dysphagia.  No esophageal abnormality to explain dysphagia but was empirically dilated.  Moderate amount of food found in the stomach.  Patient responded well to the empiric esophageal dilation and was having no further dysphagia on return office visit with Dr. Carlean Purl in mid April 2018.  Recurrent hematemesis  with hospitalization in late July 2018.  Dr. Loletha Carrow felt that the patient was having exacerbation of gastric paresis and the small amount of hematemesis did not warrant repeat EGD.  He was treated with scheduled, staggered doses of Zofran and Phenergan.   Compliant with BID Reglan and daily Omeprozole.  He also takes omeprazole 40 mg daily.  Unfortunately he also takes oxycodone 4 times daily but normally tolerates this w/o GI s/e.   Antirejection drugs include Prograf, mycophenolate and low-dose prednisone.    Patient presented again to the  emergency department last night with abdominal pain and CG emesis.  For the last few months he has been doing well had not had any problems with his GI symptoms, no nausea or vomiting.  Having regular daily bowel movements.  Yesterday afternoon had acute onset periumbilical abdominal pain and vomited several times.  Eventually he was vomiting up coffee-ground material. At its worst his pain 9/10 in severity.  He was not able to keep down any oral antinausea ulcer and came to the ED.  Initial SBPs to 216 and DBPs to 112.  Pulses in low 100s.   CT scan abdomen and pelvis with out contrast today showed sigmoid diverticulosis.  Stable transplanted right kidney in the pelvis and native renal atrophy with extensive renal vascular calcifications. Hgb 16.5. Coags normal.  Glucose 97, mean plasma glucose 108.   Lipase 59 (rr 11-51), normal or subnormal LFTs.   DRE performed and FOBT negative. Symptoms improved with placement of NG tube to low intermittent suction.  The material suctioned thus far is amber colored bilious and not coffee ground or bloody in appearance.  700 cc of fluid suctioned initially, now with 150 - 200 cc of fluid in canister.    No NSAIDs, ETOH, new meds.  Occasionally smokes pot.      Past Medical History:  Diagnosis Date  . 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 Arthritis    "  I think I do; just in my fingers & my hands"  . Blood transfusion   . Cataract   . Chronic pain   . Depression    Patient states he has never been depressed.  . Diabetes mellitus without complication ([place])    no since pancreas transplant  . Dialysis patient [facility]) 04/18/12   "[facility]; Tues, [place], [place]"  . Edema   . ESRD (end stage renal disease) on dialysis ([place])   . Gastroparesis   . Gastropathy   . GERD (gastroesophageal reflux disease)   . Headache(784.0)   . Hypertension     Past Surgical History:  Procedure Laterality Date  . AV FISTULA PLACEMENT  08/2011   left upper  arm  . BELOW KNEE LEG AMPUTATION  "it's been awhile"   bilaterally  . CATARACT EXTRACTION  ~ 2011   right  . COMBINED KIDNEY-PANCREAS TRANSPLANT    . ESOPHAGOGASTRODUODENOSCOPY N/A 12/30/2016   Procedure: ESOPHAGOGASTRODUODENOSCOPY (EGD);  Surgeon: Carol Ada, MD;  Location[facility] ENDOSCOPY;  Service: Endoscopy;  Laterality: N/A;    Prior to Admission medications   Medication Sig Start Date End Date Taking? Authorizing Provider  aspirin EC 81 MG tablet Take 81 mg by mouth daily.   Yes [provider]  losartan (COZAAR) 100 MG tablet Take 100 mg by mouth daily.  09/08/16  Yes [provider]  metoCLOPramide (REGLAN) 10 MG tablet Take 1 tablet (10 mg total) by mouth 4 (four) times daily. Take 1tab 20-30 minutes before breakfast, lunch, dinner and at bedtime 03/01/17  Yes Levin Erp, [place]  mycophenolate (MYFORTIC) 180 MG EC tablet Take 360 mg by mouth 2 (two) times daily.  07/06/13  Yes [provider]  omeprazole (PRILOSEC) 40 MG capsule Take 1 capsule (40 mg total) by mouth daily. Take 1 cap 30-60 minutes before breakfast 03/01/17  Yes Lemmon, Lavone Nian, PA  ondansetron (ZOFRAN ODT) 4 MG disintegrating tablet Take 1 tablet (4 mg total) by mouth every 8 (eight) hours as needed for nausea or vomiting. 01/08/15  Yes Gareth Morgan, MD  OxyCODONE HCl 15 MG TABA Take 15 mg by mouth 4 (four) times daily.   Yes [provider]  predniSONE (DELTASONE) 5 MG tablet Take 5 mg by mouth daily.  06/20/13  Yes [provider]  promethazine (PHENERGAN) 25 MG tablet Take 25 mg by mouth every 6 (six) hours as needed for nausea or vomiting.   Yes [provider]  saccharomyces boulardii (FLORASTOR) 250 MG capsule Take 250 mg by mouth 2 (two) times daily.   Yes [provider]  sulfamethoxazole-trimethoprim (BACTRIM,SEPTRA) 400-80 MG per tablet Take 1 tablet by mouth every Monday, Wednesday, and Friday.    Yes [provider]  tacrolimus  (PROGRAF) 1 MG capsule Take 2-3 mg by mouth 2 (two) times daily. 3 mg in the morning, 2 mg in the evening 07/16/13  Yes [provider]    Scheduled Meds: . losartan  100 mg Oral Daily  . metoCLOPramide (REGLAN) injection  5 mg Intravenous Q8H  . mycophenolate  360 mg Oral BID  . nicotine  21 mg Transdermal Daily  . [START ON 10/29/2017] pantoprazole  40 mg Intravenous Q12H  . predniSONE  5 mg Oral Daily  . [START ON 10/27/2017] sulfamethoxazole-trimethoprim  1 tablet Oral Q M,W,F  . tacrolimus  2 mg Oral QHS  . tacrolimus  3 mg Oral Daily   Infusions: . sodium chloride 1,000 mL (10/26/17 0120)  .  pantoprozole (PROTONIX) infusion Stopped (10/26/17 0940)   PRN Meds: acetaminophen **OR** acetaminophen, hydrALAZINE, LORazepam, morphine injection, nitroGLYCERIN, ondansetron (ZOFRAN) IV, zolpidem   Allergies as of 10/25/2017 - Review Complete 10/25/2017  Allergen Reaction Noted  . Lisinopril  02/17/2017  . Amlodipine Rash 02/17/2017    Family History  Problem Relation Age of Onset  . Diabetes Other   . Hypertension Other   . Hypertension Mother   . Diabetes Mother   . Kidney disease Mother   . Diabetes Maternal Grandmother   . Diabetes Paternal Grandmother   . Colon cancer Neg Hx   . Esophageal cancer Neg Hx   . Rectal cancer Neg Hx   . Stomach cancer Neg Hx     Social History   Social History  . Marital status: Married    Spouse name: N/A  . Number of children: 1  . Years of education: 64   Occupational History  . Disability    Social History Main Topics  . Smoking status: Current Some Day Smoker  . Smokeless tobacco: Never Used  . Alcohol use No  . Drug use: Yes    Frequency: 3.0 times per week    Types: Marijuana     Comment: Occasionally  . Sexual activity: Not Currently   Other Topics Concern  . Not on file   Social History Narrative   Fun: Restore old cars     REVIEW OF SYSTEMS: Constitutional:  Generally no weakness or fatigue.  Activity  limited due to Bil BKAs.   ENT:  No nose bleeds Pulm: Denies shortness of breath or cough. CV:  No palpitations, no LE edema.  No pain GU:  No hematuria, no frequency GI:  Per HPI Heme: No excessive bleeding or bruising tendencies. Transfusions: No previous transfusions of blood products Neuro:  No headaches, no peripheral tingling or numbness Derm:  No itching, no rash or sores.  Endocrine:  No sweats or chills.  No polyuria or dysuria Immunization: Reviewed.  Is up-to-date on his flu shot has previously undergone pneumococcal vaccination and Td.   Travel:  None beyond local counties in last few months.    PHYSICAL EXAM: Vital signs in last 24 hours: Vitals:   10/26/17 1330 10/26/17 1415  BP: (!) 195/94 (!) 183/86  Pulse: (!) 104 (!) 109  Resp: (!) 23 (!) 24  Temp:    SpO2: 100% 99%   Wt Readings from Last 3 Encounters:  08/22/17 81.6 kg (180 lb)  07/20/17 68.1 kg (150 lb 1.6 oz)  05/14/17 78 kg (172 lb)    General: Somnolent, somewhat difficult to arouse but appropriate when aroused.  He is sleeping comfortably. Head: No signs of head trauma.  No facial asymmetry or swelling Eyes: No scleral icterus. Ears: Not hard of hearing Nose: No discharge.  NG tube in place with amber clear fluid in suction tubing and canister, there is no blood or coffee grounds Mouth: Moist, clear oropharynx.  Tongue midline. Neck: No JVD, thyromegaly or masses. Lungs: Poor inspiratory effort but lungs clear and no labored breathing or cough. Heart: RRR.  No MRG.  S1, S2 present Abdomen: Soft.  Bowel sounds active.  Well-healed midline surgical scar.  Tenderness diffusely in the mid abdomen without guarding or rebound.  No masses or HSM appreciated.  Unable to appreciate transplanted kidney. Rectal: Deferred rectal exam which was performed by ED staff.  Stool FOBT negative. Musc/Skeltl: No joint swelling or gross deformities.  Status post bilateral BKA with no open wounds  at the  stumps. Extremities: Status post bilateral BKA.  No wounds at the stump.  Palpable AV graft in the left upper arm but no thrill present. Neurologic: Difficult to arouse but when aroused is appropriate and oriented x3.  Able to move all 4 limbs, strength not tested.  No tremor. Skin: No rashes or sores. Tattoos: Extensive monochromatic tattoos on the arms. Nodes: No cervical or inguinal adenopathy. Psych: Sleepy, flat affect, disengaged.  Last morphine was given at 1040 this morning, so nearly 5.5 hours ago.  Intake/Output from previous day: 10/31 0701 - 11/01 0700 In: 1000 [IV Piggyback:1000] Out: 400 [Emesis/NG output:400] Intake/Output this shift: Total I/O In: -  Out: 700 [Emesis/NG output:700]  LAB RESULTS:  Recent Labs  10/25/17 1824 10/26/17 0432  WBC 6.1 7.0  HGB 17.2* 16.5  HCT 52.5* 50.1  PLT 262 258   BMET Lab Results  Component Value Date   NA 142 10/26/2017   NA 142 10/25/2017   NA 140 07/17/2017   K 3.9 10/26/2017   K 3.9 10/25/2017   K 4.2 07/17/2017   CL 110 10/26/2017   CL 108 10/25/2017   CL 105 07/17/2017   CO2 20 (L) 10/26/2017   CO2 23 10/25/2017   CO2 24 07/17/2017   GLUCOSE 97 10/26/2017   GLUCOSE 112 (H) 10/25/2017   GLUCOSE 105 (H) 07/17/2017   BUN 14 10/26/2017   BUN 15 10/25/2017   BUN 15 07/17/2017   CREATININE 1.50 (H) 10/26/2017   CREATININE 1.59 (H) 10/25/2017   CREATININE 1.63 (H) 07/17/2017   CALCIUM 8.9 10/26/2017   CALCIUM 9.5 10/25/2017   CALCIUM 9.8 07/17/2017   LFT  Recent Labs  10/25/17 1824  PROT 7.8  ALBUMIN 4.1  AST 20  ALT 15*  ALKPHOS 92  BILITOT 0.6   PT/INR Lab Results  Component Value Date   INR 1.00 10/25/2017   INR 1.02 12/30/2016   INR 0.93 12/29/2016   Hepatitis Panel No results for input(s): HEPBSAG, HCVAB, HEPAIGM, HEPBIGM in the last 72 hours. C-Diff No components found for: CDIFF Lipase     Component Value Date/Time   LIPASE 58 (H) 10/25/2017 2009    Drugs of Abuse      Component Value Date/Time   LABOPIA NONE DETECTED 10/26/2017 0443   COCAINSCRNUR NONE DETECTED 10/26/2017 0443   COCAINSCRNUR NEG 01/21/2011 2024   LABBENZ NONE DETECTED 10/26/2017 0443   LABBENZ NEG 01/21/2011 2024   AMPHETMU NONE DETECTED 10/26/2017 0443   THCU POSITIVE (A) 10/26/2017 0443   LABBARB NONE DETECTED 10/26/2017 0443     RADIOLOGY STUDIES: Ct Abdomen Pelvis Wo Contrast  Result Date: 10/25/2017 CLINICAL DATA:  Generalized abdominal pain with hematemesis today. EXAM: CT ABDOMEN AND PELVIS WITHOUT CONTRAST TECHNIQUE: Multidetector CT imaging of the abdomen and pelvis was performed following the standard protocol without IV contrast. COMPARISON:  CT 10/11/2016 FINDINGS: Lower chest: Subsegmental scarring or atelectasis in the right middle lobe. Top-normal size heart without pericardial effusion. Small hiatal hernia. Hepatobiliary: The physiologically distended gallbladder without stones. The unenhanced liver demonstrates no biliary dilatation or definite mass. Pancreas: Unremarkable and nonacute. Spleen: Negative Adrenals/Urinary Tract: Normal bilateral adrenal glands. Bilateral extensive renovascular calcifications of both kidneys with mild atrophy of the native kidneys. No significant cortical scarring or thinning. No nephrolithiasis nor obstructive uropathy. Right-sided transplanted pelvic kidney is noted. The urinary bladder is physiologically distended. Stomach/Bowel: Stomach is within normal limits. No evidence of acute appendicitis. No evidence of bowel wall thickening, distention, or inflammatory changes.  There is sigmoid diverticulosis. Vascular/Lymphatic: Scattered aortic and moderate common iliac calcific atherosclerosis. Moderate atherosclerosis of the SMA and IMA. No adenopathy. Reproductive: Mildly enlarged prostate up to 5.9 cm. Normal seminal vesicles. Other: No abdominal wall hernia or abnormality. No abdominopelvic ascites. Musculoskeletal: No acute or significant osseous  findings. IMPRESSION: 1. Mild native renal atrophy with extensive renovascular calcifications. Stable right transplanted pelvic kidney. No obstructive uropathy. 2. Sigmoid diverticulosis without acute diverticulitis. Electronically Signed   By: Ashley Royalty M.D.   On: 10/25/2017 22:57      IMPRESSION:   *  Acute flare of gastroparesis with limited coffee ground emesis and no anemia.  No evidence of active infection which would have contributed to the flare of gastroparesis. Unfortunately he takes stable doses of oxycodone 4 times daily for musculoskeletal pain but normally tolerates this well.  Has been compliant with GI orders for twice daily metoclopramide and daily omeprazole. Minor elevation of lipase within normal to below normal LFTs.  Not clear that the lipase is of any significance. SXS improved with NGT.    *  S/p pancreas and renal transplant.  On chronic immunosuppression.   Previous history of type 1 diabetes, resolved after transplant and current serum glucose and mean plasma glucose at levels of suggestive of good control.  Creatinine slightly elevated, normal BUN but GFR > 60.  *  Hypertension.  Initial readings as high as 219/114.  Current readings in the 150s-190s/ 80s-90s.  Wonder whether this is playing a role regarding nausea vomiting?   PLAN:     *  Discontinued the IV Protonix drip once the current bag finishes.  After that begin twice daily IV dosing of Protonix.  Since the NG tube has been placed, do not see a role for scheduled anti-emetics and wonder whether the currently ordered Reglan 5 mg IV every 8 hours is necessary given NG tube is in place.  *  Continue supportive care with NG tube and intermittent suction.  Hgb/hct at 6 PM and CBC in AM.    *  Given that the coffee-ground emesis has been limited, do not see any role for EGD at this point.  Should his Hgb/hematocrit drop drastically or frank hematemesis occur or he develops melena we can revisit the decision  regarding EGD.    Azucena Freed  10/26/2017, 3:18 PM Pager: [phone]  GI ATTENDING  History, laboratories, x-rays, prior endoscopy reports personally reviewed. Patient seen and examined. Agree with comprehensive consultation note as outlined above. Patient has known gastroparesis. This is the most likely cause for his nausea with vomiting with out-of-control significant hypertension as a possible contributor. His GI bleeding is trivial. He has had this on multiple occasions this year. Several upper endoscopies with minimal findings except for evidence of gastroparesis. Agree with empiric PPI, antiemetic therapy, minimizing narcotics, and treatment of his hypertensive urgency. No indication for endoscopy. No additional recommendations from GI standpoint. We are available if needed, but will sign off at this point.  Docia Chuck. Geri Seminole., M.D. [facility] Division of Gastroenterology

## 2017-10-26 NOTE — H&P (Signed)
History and Physical    [name] ZTI:[id number] DOB: 12-22-1976 DOA: 10/25/2017  Referring MD/NP/PA:   PCP: Golden Circle, FNP   Patient coming from:  The patient is coming from home.  At baseline, pt is independent for most of ADL. SNF  Assistant living facility   [facility]irement center.       Chief Complaint: Hematemesis, abdominal pain   HPI: [name] is a 41 y.o. male with medical history significant of combined renal and pancreatic transplant, bilateral BKA, blood transfusion, previous dialysis, gastroparesis, GERD, depression, chronic pain, tobacco abuse, CKD-III, who presents with hematemesis and abdominal pain.  Pt states that he started having abdominal pain, nausea and vomiting at about 4 PM. It is associated with hematemesis for many times. The abdominal pain is located in the epigastric and periumbilical area, constant, 9 out of 10 in severity, nonradiating. Patient does not have for diarrhea, hematochezia or dark stool. He denies chest pain, cough, shortness of breath. No symptoms of UTI or unilateral weakness. States he underwent renal and pancreatic transplant 4 years ago, is still on immunosuppressants. He has been compliant  ED Course: pt was found to have hemoglobin 17.2, WBC 6.1, INR 1.0, PTT 40, negative troponin, lipase 58, alcohol level less than 10, stable renal function, elevated blood pressure 219/114, tachycardia, tachypnea, oxygen saturation section 98% on room air, temperature normal. Pending FOBT. Patient is admitted to stepdown as inpatient.  CT-abd/pelvis showed: 1. Mild native renal atrophy with extensive renovascular calcifications. Stable right transplanted pelvic kidney. No obstructive uropathy. 2. Sigmoid diverticulosis without acute diverticulitis  Review of Systems:   General: no fevers, chills, no body weight gain, has poor appetite, has fatigue HEENT: no blurry vision, hearing changes or sore throat Respiratory: no dyspnea,  coughing, wheezing CV: no chest pain, no palpitations GI: has nausea, vomiting, abdominal pain and hematemesis. No diarrhea, constipation GU: no dysuria, burning on urination, increased urinary frequency, hematuria  Ext: no leg edema. S/p of bilateral BKA  Neuro: no unilateral weakness, numbness, or tingling, no vision change or hearing loss Skin: no rash, no skin tear. MSK: No muscle spasm, no deformity, no limitation of range of movement in spin Heme: No easy bruising.  Travel history: No recent long distant travel.  Allergy:  Allergies  Allergen Reactions  . Lisinopril     Cough  . Amlodipine Rash    Past Medical History:  Diagnosis Date  . 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 Arthritis    "I think I do; just in my fingers & my hands"  . Blood transfusion   . Cataract   . Chronic pain   . Depression    Patient states he has never been depressed.  . Diabetes mellitus without complication ([place])    no since pancreas transplant  . Dialysis patient [facility]) 04/18/12   "[facility]; Tues, [place], [place]"  . Edema   . ESRD (end stage renal disease) on dialysis ([place])   . Gastroparesis   . Gastropathy   . GERD (gastroesophageal reflux disease)   . Headache(784.0)   . Hypertension     Past Surgical History:  Procedure Laterality Date  . AV FISTULA PLACEMENT  08/2011   left upper arm  . BELOW KNEE LEG AMPUTATION  "it's been awhile"   bilaterally  . CATARACT EXTRACTION  ~ 2011   right  . COMBINED KIDNEY-PANCREAS TRANSPLANT    . ESOPHAGOGASTRODUODENOSCOPY N/A 12/30/2016   Procedure: ESOPHAGOGASTRODUODENOSCOPY (EGD);  Surgeon: Saralyn Pilar  Benson Norway, MD;  Location: [place];  Service: Endoscopy;  Laterality: N/A;    Social History:  reports that he has been smoking.  He has never used smokeless tobacco. He reports that he uses drugs, including Marijuana, about 3 times per week. He reports that he does not drink alcohol.  Family History:  Family History  Problem  Relation Age of Onset  . Diabetes Other   . Hypertension Other   . Hypertension Mother   . Diabetes Mother   . Kidney disease Mother   . Diabetes Maternal Grandmother   . Diabetes Paternal Grandmother   . Colon cancer Neg Hx   . Esophageal cancer Neg Hx   . Rectal cancer Neg Hx   . Stomach cancer Neg Hx      Prior to Admission medications   Medication Sig Start Date End Date Taking? Authorizing Provider  aspirin EC 81 MG tablet Take 81 mg by mouth daily.   Yes [provider]  losartan (COZAAR) 100 MG tablet Take 100 mg by mouth daily.  09/08/16  Yes [provider]  metoCLOPramide (REGLAN) 10 MG tablet Take 1 tablet (10 mg total) by mouth 4 (four) times daily. Take 1tab 20-30 minutes before breakfast, lunch, dinner and at bedtime 03/01/17  Yes Levin Erp, [place]  mycophenolate (MYFORTIC) 180 MG EC tablet Take 360 mg by mouth 2 (two) times daily.  07/06/13  Yes [provider]  omeprazole (PRILOSEC) 40 MG capsule Take 1 capsule (40 mg total) by mouth daily. Take 1 cap 30-60 minutes before breakfast 03/01/17  Yes Lemmon, Lavone Nian, PA  ondansetron (ZOFRAN ODT) 4 MG disintegrating tablet Take 1 tablet (4 mg total) by mouth every 8 (eight) hours as needed for nausea or vomiting. 01/08/15  Yes Gareth Morgan, MD  OxyCODONE HCl 15 MG TABA Take 15 mg by mouth 4 (four) times daily.   Yes [provider]  predniSONE (DELTASONE) 5 MG tablet Take 5 mg by mouth daily.  06/20/13  Yes [provider]  promethazine (PHENERGAN) 25 MG tablet Take 25 mg by mouth every 6 (six) hours as needed for nausea or vomiting.   Yes [provider]  saccharomyces boulardii (FLORASTOR) 250 MG capsule Take 250 mg by mouth 2 (two) times daily.   Yes [provider]  sulfamethoxazole-trimethoprim (BACTRIM,SEPTRA) 400-80 MG per tablet Take 1 tablet by mouth every Monday, Wednesday, and Friday.    Yes [provider]  tacrolimus (PROGRAF) 1 MG  capsule Take 2-3 mg by mouth 2 (two) times daily. 3 mg in the morning, 2 mg in the evening 07/16/13  Yes [provider]    Physical Exam: Vitals:   10/25/17 2300 10/25/17 2345 10/26/17 0045 10/26/17 0130  BP: (!) 204/111 (!) 212/112 (!) 211/87 (!) 194/103  Pulse: 99 99 98   Resp:  17 (!) 22 (!) 26  Temp:      TempSrc:      SpO2: 100% 99% 94%    General: In acute distress HEENT:       Eyes: PERRL, EOMI, no scleral icterus.       ENT: No discharge from the ears and nose, no pharynx injection, no tonsillar enlargement.        Neck: No JVD, no bruit, no mass felt. Heme: No neck lymph node enlargement. Cardiac: S1/S2, RRR, No murmurs, No gallops or rubs. Respiratory:  No rales, wheezing, rhonchi or rubs. GI: Soft, nondistended, has tenderness diffusely, no rebound pain, no organomegaly, BS present.  GU: No hematuria Ext: No pitting leg edema bilaterally. S/p of bilateral BKA  Musculoskeletal: No joint deformities, No joint redness or warmth, no limitation of ROM in spin. Skin: No rashes.  Neuro: Alert, oriented X3, cranial nerves II-XII grossly intact, moves all extremities normally.  Psych: Patient is not psychotic, no suicidal or hemocidal ideation.  Labs on Admission: I have personally reviewed following labs and imaging studies  CBC:  Recent Labs Lab 10/25/17 1824  WBC 6.1  HGB 17.2*  HCT 52.5*  MCV 92.4  PLT 637   Basic Metabolic Panel:  Recent Labs Lab 10/25/17 1824  NA 142  K 3.9  CL 108  CO2 23  GLUCOSE 112*  BUN 15  CREATININE 1.59*  CALCIUM 9.5   GFR: CrCl cannot be calculated (Unknown ideal weight.). Liver Function Tests:  Recent Labs Lab 10/25/17 1824  AST 20  ALT 15*  ALKPHOS 92  BILITOT 0.6  PROT 7.8  ALBUMIN 4.1    Recent Labs Lab 10/25/17 2009  LIPASE 58*   No results for input(s): AMMONIA in the last 168 hours. Coagulation Profile:  Recent Labs Lab 10/25/17 2009  INR 1.00   Cardiac Enzymes: No results for  input(s): CKTOTAL, CKMB, CKMBINDEX, TROPONINI in the last 168 hours. BNP (last 3 results) No results for input(s): PROBNP in the last 8760 hours. HbA1C: No results for input(s): HGBA1C in the last 72 hours. CBG: No results for input(s): GLUCAP in the last 168 hours. Lipid Profile: No results for input(s): CHOL, HDL, LDLCALC, TRIG, CHOLHDL, LDLDIRECT in the last 72 hours. Thyroid Function Tests: No results for input(s): TSH, T4TOTAL, FREET4, T3FREE, THYROIDAB in the last 72 hours. Anemia Panel: No results for input(s): VITAMINB12, FOLATE, FERRITIN, TIBC, IRON, RETICCTPCT in the last 72 hours. Urine analysis:    Component Value Date/Time   COLORURINE AMBER (A) 10/11/2016 2328   APPEARANCEUR CLEAR 10/11/2016 2328   LABSPEC 1.026 10/11/2016 2328   PHURINE 6.0 10/11/2016 2328   GLUCOSEU NEGATIVE 10/11/2016 2328   HGBUR NEGATIVE 10/11/2016 2328   HGBUR negative 10/08/2010 0853   BILIRUBINUR MODERATE (A) 10/11/2016 2328   BILIRUBINUR SMALL 12/14/2015 1139   KETONESUR NEGATIVE 10/11/2016 2328   PROTEINUR 30 (A) 10/11/2016 2328   UROBILINOGEN 1.0 12/14/2015 1139   UROBILINOGEN 0.2 09/13/2015 0051   NITRITE NEGATIVE 10/11/2016 2328   LEUKOCYTESUR NEGATIVE 10/11/2016 2328   Sepsis Labs: @LABRCNTIP (procalcitonin:4,lacticidven:4) )No results found for this or any previous visit (from the past 240 hour(s)).   Radiological Exams on Admission: Ct Abdomen Pelvis Wo Contrast  Result Date: 10/25/2017 CLINICAL DATA:  Generalized abdominal pain with hematemesis today. EXAM: CT ABDOMEN AND PELVIS WITHOUT CONTRAST TECHNIQUE: Multidetector CT imaging of the abdomen and pelvis was performed following the standard protocol without IV contrast. COMPARISON:  CT 10/11/2016 FINDINGS: Lower chest: Subsegmental scarring or atelectasis in the right middle lobe. Top-normal size heart without pericardial effusion. Small hiatal hernia. Hepatobiliary: The physiologically distended gallbladder without stones. The  unenhanced liver demonstrates no biliary dilatation or definite mass. Pancreas: Unremarkable and nonacute. Spleen: Negative Adrenals/Urinary Tract: Normal bilateral adrenal glands. Bilateral extensive renovascular calcifications of both kidneys with mild atrophy of the native kidneys. No significant cortical scarring or thinning. No nephrolithiasis nor obstructive uropathy. Right-sided transplanted pelvic kidney is noted. The urinary bladder is physiologically distended. Stomach/Bowel: Stomach is within normal limits. No evidence of acute appendicitis. No evidence of bowel wall thickening, distention, or inflammatory changes. There is sigmoid diverticulosis. Vascular/Lymphatic: Scattered aortic and moderate common iliac calcific atherosclerosis. Moderate  atherosclerosis of the SMA and IMA. No adenopathy. Reproductive: Mildly enlarged prostate up to 5.9 cm. Normal seminal vesicles. Other: No abdominal wall hernia or abnormality. No abdominopelvic ascites. Musculoskeletal: No acute or significant osseous findings. IMPRESSION: 1. Mild native renal atrophy with extensive renovascular calcifications. Stable right transplanted pelvic kidney. No obstructive uropathy. 2. Sigmoid diverticulosis without acute diverticulitis. Electronically Signed   By: Ashley Royalty M.D.   On: 10/25/2017 22:57     EKG: Independently reviewed. First EKG showed sinus rhythm, QTC 450, LAD, anteroseptal infarction pattern, poor R-wave progression. Repeat EKG showed sinus rhythm, QTC 401, ST depression in inferior leads, LAD, anteroseptal infarction pattern, poor R-wave progression.     Assessment/Plan Principal Problem:   Hematemesis Active Problems:   GERD   Renal transplant, status post   Immunosuppressed status ([place])   H/O pancreas transplant ([place])   Hypertensive urgency   Hypertension   CKD (chronic kidney disease), stage III (HCC)   Abnormal EKG   Hematemesis: Etiology is not clear. Differential diagnoses include gastric  ulcer disease, gastritis and Mallory-Weiss syndrome. Hemoglobin stable 1722. Hemodynamically stable. GI, Dr. Carlean Purl was consulted. EDP, will see patient in the morning. Pt had EGD by Dr. Carlean Purl on 03/22/17 due to dysphagia, which had negative findings.  - will admit to tele bed as inpt - NG tube is placed in ED - GI consulted by Ed, will follow up recommendations - NPO - IVF: 1L NS bolus, then at 125 mL/hr - Start IV pantoprazole gtt - reglan and prn Zofran IV for nausea - Avoid NSAIDs and SQ heparin - Maintain IV access (2 large bore IVs if possible). - Monitor closely and follow q6h cbc, transfuse as necessary. - LaB: INR, PTT and type screen - Hold ASA  GERD: -on protonix gtt  Hx of renal transplant and pancreas transplant: -continue home immunosuppressant, mycophenolate, prednisone and tacrolimus when NG tube is out -Check tacrolimus level  Hypertensive urgency: Bp 219/114, likely due to acute distress secondary to severe nausea vomiting and hematemesis  -IV hydralazine when necessary  CKD (chronic kidney disease), stage III ([place]): stable. Baseline creatinine 1.5-1.6. His creatinine is 1.59, bili 15. -Follow-up renal function by BMP  Abnormal EKG: The second EKG showed ST depression in inferior leads, which is likely due to demand ischemia secondary to hypertensive urgency. No chest pain. -Troponin 3 -Check A1c and FLP - repeat EKG in morning -When necessary nitroglycerin and morphine for chest pain   DVT ppx: SCD Code Status: Full code Family Communication: None at bed side.  Disposition Plan:  Anticipate discharge back to previous home environment Consults called:  GI, Dr. Carlean Purl Admission status: SDU/inpation       Date of Service 10/26/2017    Ivor Costa [facility] Pager [phone]  If 7PM-7AM, please contact night-coverage [email or web address] Password [email or web address] 10/26/2017, 2:14 AM

## 2017-10-26 NOTE — ED Notes (Signed)
RN attempted lab draw, 2nd RN to obtain lab draw

## 2017-10-27 LAB — TACROLIMUS LEVEL: Tacrolimus (FK506) - LabCorp: 4.5 ng/mL (ref 2.0–20.0)

## 2017-10-27 MED ORDER — LABETALOL HCL 5 MG/ML IV SOLN
5.0000 mg | Freq: Two times a day (BID) | INTRAVENOUS | Status: DC
  Administered 2017-10-27: 5 mg via INTRAVENOUS
  Filled 2017-10-27: qty 4

## 2017-10-27 MED ORDER — HYDRALAZINE HCL 50 MG PO TABS
25.0000 mg | ORAL_TABLET | Freq: Three times a day (TID) | ORAL | Status: DC
  Administered 2017-10-27 (×2): 25 mg via ORAL
  Filled 2017-10-27 (×2): qty 1

## 2017-10-27 MED ORDER — METOCLOPRAMIDE HCL 5 MG/ML IJ SOLN: 5.0000 mg | Freq: Three times a day (TID) | INTRAMUSCULAR | Status: DC | PRN

## 2017-10-27 NOTE — Care Management Note (Signed)
Case Management Note  Patient Details  Name: [name] MRN: [id number] Date of Birth: 01/06/1976  Subjective/Objective:    Pt admitted with nausea and hematemesis               Action/Plan:  Per pt; pt is independent from home with wife, pt has wheelchair at home.  Pt states he has been a bilateral BKA (has prothesis) for approximately 15 years.  Pt states he has PCP and denied barriers to obtaining and paying for medications, pt also denied barriers to returning home/declined CM needs     Expected Discharge Date:                  Expected Discharge Plan:  Home/Self Care  In-House Referral:     Discharge planning Services  CM Consult  Post Acute Care Choice:    Choice offered to:     DME Arranged:    DME Agency:     HH Arranged:    HH Agency:     Status of Service:     If discussed at [employer], dates discussed:    Additional Comments:  Maryclare Labrador, RN 10/27/2017, 3:15 PM

## 2017-10-27 NOTE — Progress Notes (Addendum)
PROGRESS NOTE  [name] VQM:[id number] DOB: 08/14/1976 DOA: 10/25/2017 PCP: Golden Circle, FNP  HPI/Recap of past 24 hours: [name] was seen and examined at his bedside. He is a little more alert today; keeps his eyes closed most of the encounter but responds to questions appropriately. Still has NG tube. Reports nausea is less severe. Denies abdominal pain or diarrhea. Admits to weakness and generalized fatigue.  Assessment/Plan: Principal Problem:   Hematemesis Active Problems:   GERD   Renal transplant, status post   Immunosuppressed status ([place])   H/O pancreas transplant ([place])   Hypertensive urgency   Hypertension   CKD (chronic kidney disease), stage III ([place])   Abnormal EKG   Code Status: Full  Family Communication: No family members at bedside at the time of this encounter.  Disposition Plan: Will stay another midnight due to persistent weakness and nausea.   Consultants:  GI  Procedures:  NG tube placement for gastroparesis  Antimicrobials:  Bactrim  DVT prophylaxis:  No chemical DVT ppx due to reported GI bleed by patient   Objective: Vitals:   10/26/17 1615 10/26/17 1645 10/26/17 1738 10/27/17 0552  BP: (!) 163/85  (!) 171/76 (!) 202/103  Pulse: (!) 110  (!) 116 (!) 105  Resp: (!) 27 18 (!) 25 (!) 26  Temp:   98.9 F (37.2 C) 98.2 F (36.8 C)  TempSrc:   Oral Oral  SpO2: 98%  96% 98%  Weight:   72.3 kg (159 lb 6.3 oz)   Height:   6' (1.829 m)     Intake/Output Summary (Last 24 hours) at 10/27/17 0719 Last data filed at 10/27/17 0644  Gross per 24 hour  Intake          3558.33 ml  Output             1575 ml  Net          1983.33 ml   Filed Weights   10/26/17 1738  Weight: 72.3 kg (159 lb 6.3 oz)    Exam:   General:  41 year old AAM, well developed well nourished laying in bed. Makes minimal eye contact.  Cardiovascular: RRR with no rubs or gallops  Respiratory: CTA with no wheezes or rhonchi  Abdomen: Non  distended, non tender with hypoactive bowel sounds x 4 quadrants.  Musculoskeletal: Bilateral below the knee amputation  Skin: No noted open ulcers  Psychiatry: Flat affect  Data Reviewed: CBC:  Recent Labs Lab 10/25/17 1824 10/26/17 0432 10/26/17 1531 10/26/17 1805  WBC 6.1 7.0 7.6 9.6  HGB 17.2* 16.5 15.9 16.3  HCT 52.5* 50.1 49.1 49.9  MCV 92.4 92.6 93.2 93.4  PLT 262 258 264 932   Basic Metabolic Panel:  Recent Labs Lab 10/25/17 1824 10/26/17 0432  NA 142 142  K 3.9 3.9  CL 108 110  CO2 23 20*  GLUCOSE 112* 97  BUN 15 14  CREATININE 1.59* 1.50*  CALCIUM 9.5 8.9   GFR: Estimated Creatinine Clearance: 65.6 mL/min (A) (by C-G formula based on SCr of 1.5 mg/dL (H)). Liver Function Tests:  Recent Labs Lab 10/25/17 1824  AST 20  ALT 15*  ALKPHOS 92  BILITOT 0.6  PROT 7.8  ALBUMIN 4.1    Recent Labs Lab 10/25/17 2009  LIPASE 58*   No results for input(s): AMMONIA in the last 168 hours. Coagulation Profile:  Recent Labs Lab 10/25/17 2009  INR 1.00   Cardiac Enzymes:  Recent Labs Lab 10/26/17 0432  10/26/17 1038 10/26/17 1531  TROPONINI <0.03 <0.03 <0.03   BNP (last 3 results) No results for input(s): PROBNP in the last 8760 hours. HbA1C:  Recent Labs  10/26/17 0432  HGBA1C 5.4   CBG: No results for input(s): GLUCAP in the last 168 hours. Lipid Profile:  Recent Labs  10/26/17 0432  CHOL 171  HDL 53  LDLCALC 106*  TRIG 58  CHOLHDL 3.2   Thyroid Function Tests: No results for input(s): TSH, T4TOTAL, FREET4, T3FREE, THYROIDAB in the last 72 hours. Anemia Panel: No results for input(s): VITAMINB12, FOLATE, FERRITIN, TIBC, IRON, RETICCTPCT in the last 72 hours. Urine analysis:    Component Value Date/Time   COLORURINE AMBER (A) 10/11/2016 2328   APPEARANCEUR CLEAR 10/11/2016 2328   LABSPEC 1.026 10/11/2016 2328   PHURINE 6.0 10/11/2016 2328   GLUCOSEU NEGATIVE 10/11/2016 2328   HGBUR NEGATIVE 10/11/2016 2328   HGBUR  negative 10/08/2010 0853   BILIRUBINUR MODERATE (A) 10/11/2016 2328   BILIRUBINUR SMALL 12/14/2015 1139   KETONESUR NEGATIVE 10/11/2016 2328   PROTEINUR 30 (A) 10/11/2016 2328   UROBILINOGEN 1.0 12/14/2015 1139   UROBILINOGEN 0.2 09/13/2015 0051   NITRITE NEGATIVE 10/11/2016 2328   LEUKOCYTESUR NEGATIVE 10/11/2016 2328   Sepsis Labs: @LABRCNTIP (procalcitonin:4,lacticidven:4)  ) Recent Results (from the past 240 hour(s))  MRSA PCR Screening     Status: None   Collection Time: 10/26/17  6:31 PM  Result Value Ref Range Status   MRSA by PCR NEGATIVE NEGATIVE Final    Comment:        The GeneXpert MRSA Assay (FDA approved for NASAL specimens only), is one component of a comprehensive MRSA colonization surveillance program. It is not intended to diagnose MRSA infection nor to guide or monitor treatment for MRSA infections.       Studies: No results found.  Scheduled Meds: . hydrALAZINE  25 mg Oral Q8H  . losartan  100 mg Oral Daily  . mycophenolate  360 mg Oral BID  . nicotine  21 mg Transdermal Daily  . pantoprazole  40 mg Intravenous Q12H  . predniSONE  5 mg Oral Daily  . sulfamethoxazole-trimethoprim  1 tablet Oral Q M,W,F  . tacrolimus  2 mg Oral QHS  . tacrolimus  3 mg Oral Daily    Continuous Infusions: . sodium chloride 125 mL/hr at 10/27/17 0641     LOS: 1 day   Assessment and Plan:  Patient reported hematemesis, unclear etiology -GI consulted, Dr. Carlean Purl, we appreciate recommendations  -Hemoglobin stable -FOBT negative -EGD by Dr. Carlean Purl on 03/22/17 due to dysphagia, which had negative findings. -NPO with NG tube in place -IV pantoprazole 40 mg BID -reglan and prn Zofran IV for nausea -Maintain IV access (2 large bore IVs if possible). -No indication for EGD per GI -CBC in the am  Intractable nausea 2/2 gastroparesis -Improving -stopped scheduled antiemetics -IV phenergan q8hprn -NG tube in place  Generalized weakness possibly 2/2 to poor  oral intake -Monitor NG tube output -When more stable may consider starting clear liquids then advance diet -PT to evaluate and treat when more stable  GERD -IV protonix 40 mg BID  Hx of renal transplant and pancreas transplant -continue home immunosuppressant, mycophenolate, prednisone and tacrolimus when NG tube is out -Check tacrolimus level  Hypertensive urgency -Improving -Most likely 2/2 to acute distress secondary to severe nausea vomiting and hematemesis  -stop losartan and po hydralazine due to NPO -IV labetalol 5 mg BID - 10 mg IV hydralazine q6h prn for SBP>180  or dBP>105  CKD (chronic kidney disease), stage III (HCC) -Improving -Baseline creatinine 1.5-1.6.  -Repeat BMP in the am  Abnormal EKG -The second EKG showed ST depression in inferior leads, which is likely due to demand ischemia secondary to hypertensive urgency.  -Asymptomatic -Troponin negative 3  Below the knee amputation -Fall precaution -PT when stable   Kayleen Memos, MD [facility] Pager [phone]  If 7PM-7AM, please contact night-coverage [email or web address] Password [facility] 10/27/2017, 7:19 AM

## 2017-10-27 NOTE — Progress Notes (Signed)
   10/27/17 0829  Vitals  BP (!) 205/109  MAP (mmHg) 136  Pulse Rate (!) 110  ECG Heart Rate (!) 111  Resp (!) 25  Oxygen Therapy  SpO2 (!) 86 %  c/o pain morphine given

## 2017-10-27 NOTE — Progress Notes (Signed)
   10/27/17 1138  Vitals  Temp 98 F (36.7 C)  Temp Source Oral  BP (!) 207/108  MAP (mmHg) 132  BP Location Left Arm  BP Method Automatic  Patient Position (if appropriate) Lying  Pulse Rate (!) 118  Pulse Rate Source Monitor  ECG Heart Rate (!) 119  Cardiac Rhythm ST  Oxygen Therapy  SpO2 93 %  O2 Device Room Air  Pre-WUA / WUA Start  [place] Agitation Sedation Scale (RASS) 0  RASS Goal 0  POSS Scale ([place] Opioid Sedation Scale)  POSS *See Group Information* 1-Acceptable,Awake and alert  PCA/Epidural/Spinal Assessment  Respiratory Pattern Regular;Unlabored  Glasgow Coma Scale  Eye Opening 4  Best Verbal Response (NON-intubated) 5  Best Motor Response 6  [place] Coma Scale Score 15  hydralazine and morphine given

## 2017-10-27 NOTE — Progress Notes (Signed)
Pt refused cbg this am.

## 2017-10-28 LAB — CBC WITH DIFFERENTIAL/PLATELET
Basophils Absolute: 0 10*3/uL (ref 0.0–0.1)
Basophils Relative: 0 %
EOS ABS: 0 10*3/uL (ref 0.0–0.7)
EOS PCT: 0 %
HCT: 48.7 % (ref 39.0–52.0)
Hemoglobin: 15.7 g/dL (ref 13.0–17.0)
LYMPHS ABS: 0.9 10*3/uL (ref 0.7–4.0)
LYMPHS PCT: 9 %
MCH: 30.2 pg (ref 26.0–34.0)
MCHC: 32.2 g/dL (ref 30.0–36.0)
MCV: 93.7 fL (ref 78.0–100.0)
MONO ABS: 2 10*3/uL — AB (ref 0.1–1.0)
MONOS PCT: 20 %
Neutro Abs: 7 10*3/uL (ref 1.7–7.7)
Neutrophils Relative %: 71 %
PLATELETS: 247 10*3/uL (ref 150–400)
RBC: 5.2 MIL/uL (ref 4.22–5.81)
RDW: 15.1 % (ref 11.5–15.5)
WBC: 9.9 10*3/uL (ref 4.0–10.5)

## 2017-10-28 LAB — BASIC METABOLIC PANEL
Anion gap: 11 (ref 5–15)
BUN: 12 mg/dL (ref 6–20)
CALCIUM: 8.5 mg/dL — AB (ref 8.9–10.3)
CO2: 20 mmol/L — AB (ref 22–32)
Chloride: 109 mmol/L (ref 101–111)
Creatinine, Ser: 1.5 mg/dL — ABNORMAL HIGH (ref 0.61–1.24)
GFR calc Af Amer: >60 mL/min (ref >60)
GFR, EST NON AFRICAN AMERICAN: 56 mL/min — AB (ref >60)
GLUCOSE: 82 mg/dL (ref 65–99)
Potassium: 3.6 mmol/L (ref 3.5–5.1)
SODIUM: 140 mmol/L (ref 135–145)

## 2017-10-28 MED ORDER — LABETALOL HCL 100 MG PO TABS: 100.0000 mg | ORAL_TABLET | Freq: Two times a day (BID) | ORAL | 0 refills | Status: DC

## 2017-10-28 MED ORDER — NICOTINE 21 MG/24HR TD PT24: 21.0000 mg | MEDICATED_PATCH | Freq: Every day | TRANSDERMAL | 0 refills | Status: DC

## 2017-10-28 MED ORDER — LABETALOL HCL 5 MG/ML IV SOLN
10.0000 mg | Freq: Three times a day (TID) | INTRAVENOUS | Status: DC
  Administered 2017-10-28: 10 mg via INTRAVENOUS

## 2017-10-28 MED ORDER — HYDRALAZINE HCL 50 MG PO TABS: 50.0000 mg | ORAL_TABLET | Freq: Three times a day (TID) | ORAL | 0 refills | Status: DC

## 2017-10-28 MED ORDER — HYDRALAZINE HCL 50 MG PO TABS: 50.0000 mg | ORAL_TABLET | Freq: Three times a day (TID) | ORAL | Status: DC

## 2017-10-28 MED ORDER — HYDRALAZINE HCL 50 MG PO TABS
50.0000 mg | ORAL_TABLET | Freq: Once | ORAL | Status: AC
  Administered 2017-10-28: 50 mg via ORAL
  Filled 2017-10-28: qty 1

## 2017-10-28 NOTE — Discharge Summary (Signed)
Discharge Summary  [name] ZOX:[id number] DOB: March 24, 1976  PCP: [facility], FNP  Admit date: 10/25/2017 Discharge date: 10/28/2017  Time spent: 25 minutes  Recommendations for Outpatient Follow-up:  1. Follow up with PCP on Monday 10/30/17 to recheck BP on current antihypertensive medications 2. New meds: Hydralazine 50 mg TID and labetalol 100 mg BID 3. Follow up with GI within a week  Discharge Diagnoses:  Active Hospital Problems   Diagnosis Date Noted  . Hematemesis   . CKD (chronic kidney disease), stage III ([place]) 10/26/2017  . Abnormal EKG 10/26/2017  . Hypertension 08/22/2017  . Hypertensive urgency 12/29/2016  . Renal transplant, status post 07/19/2013  . H/O pancreas transplant ([place]) 06/27/2013  . Immunosuppressed status ([place]) 06/27/2013  . GERD 03/14/2007    Resolved Hospital Problems   Diagnosis Date Noted Date Resolved  No resolved problems to display.    Discharge Condition: Patient had NG tube removed this morning. He is adament about leaving this morning to take his son to the airport for deployment to the [employer]. Refuses to wait for trial of new meds. Agrees to check his BP at home today and tomorrow and to follow up with his PCP on Monday 10/30/17.  Diet recommendation: Resume previous diet  Vitals:   10/28/17 0814 10/28/17 1038  BP: (!) 199/109 (!) 150/83  Pulse: (!) 108 93  Resp: (!) 21 11  Temp:    SpO2: 100% 97%    History of present illness:  [name] is a 41 y.o. male with medical history significant of combined renal and pancreatic transplant, bilateral BKA, blood transfusion, previous dialysis, gastroparesis, GERD, depression, chronic pain, tobacco abuse, CKD-III, who presents with hematemesis and abdominal pain.  Pt states that he started having abdominal pain, nausea and vomiting at about 4 PM. It is associated with hematemesis for many times. The abdominal pain is located in the epigastric and periumbilical area,  constant, 9 out of 10 in severity, nonradiating. Patient does not have for diarrhea, hematochezia or dark stool. He denies chest pain, cough, shortness of breath. No symptoms of UTI or unilateral weakness. States he underwent renal and pancreatic transplant 4 years ago,is still on immunosuppressants. He has been compliant  Hospital Course:  Principal Problem:   Hematemesis Active Problems:   GERD   Renal transplant, status post   Immunosuppressed status ([place])   H/O pancreas transplant ([place])   Hypertensive urgency   Hypertension   CKD (chronic kidney disease), stage III ([place])   Abnormal EKG  Patient reported hematemesis, unclear etiology -GI consulted, Dr. Carlean Purl. -Hemoglobin stable -FOBT negative -EGD by Dr. Carlean Purl on 03/22/17 due to dysphagia, which had negative findings. -NPO with NG tube in place -IV pantoprazole 40 mg BID -reglan and prn Zofran IV for nausea -No indication for EGD per GI -Follow up with GI outpatient  Intractable nausea 2/2 gastroparesis -resolved  Generalized weakness possibly 2/2 to poor oral intake -Advised to increase oral intake  -regular physical activity as tolerated  GERD -PPI  Hx of renal transplant and pancreas transplant -continue home immunosuppressant, mycophenolate, prednisone and tacrolimus when NG tube is out -Follow up with providers  Hypertensive urgency -po hydralazine and po labetalol started today -pt refuses to stay to be monitored on the new meds states he will leave against AMA if not discharged by noon. -pt will check BP at home today and tomorrow  CKD (chronic kidney disease), stage III ([facility]) -Improving -stay hydrated  Abnormal EKG -The second EKG showed  ST depression in inferior leads, which is likely due to demand ischemia secondary to hypertensive urgency.  -Asymptomatic -Troponin negative 3  Below the knee amputation -Fall precaution at home  Procedures:  NG tube placement due to  gastroparesis  Consultations:  GI  Discharge Exam: BP (!) 150/83 (BP Location: Left Arm)   Pulse 93   Temp 98 F (36.7 C) (Oral)   Resp 11   Ht 6' (1.829 m)   Wt 72.3 kg (159 lb 6.3 oz)   SpO2 97%   BMI 21.62 kg/m    General:41 year old AAM, well developed well nourished laying in bed. Alert and oriented x 4  Cardiovascular:RRR with no rubs or gallops  Respiratory:CTA with no wheezes or rhonchi  Abdomen:Non distended, non tender with hypoactive bowel sounds x 4 quadrants.  Musculoskeletal: Bilateral below the knee amputation  Skin:No noted open ulcers  Psychiatry:Mood is appropriate for condition and circumstances  Discharge Instructions You were cared for by a hospitalist during your hospital stay. If you have any questions about your discharge medications or the care you received while you were in the hospital after you are discharged, you can call the unit and asked to speak with the hospitalist on call if the hospitalist that took care of you is not available. Once you are discharged, your primary care physician will handle any further medical issues. Please note that NO REFILLS for any discharge medications will be authorized once you are discharged, as it is imperative that you return to your primary care physician (or establish a relationship with a primary care physician if you do not have one) for your aftercare needs so that they can reassess your need for medications and monitor your lab values.   Allergies as of 10/28/2017      Reactions   Lisinopril    Cough   Amlodipine Rash      Medication List    STOP taking these medications   losartan 100 MG tablet Commonly known as:  COZAAR     TAKE these medications   aspirin EC 81 MG tablet Take 81 mg by mouth daily.   hydrALAZINE 50 MG tablet Commonly known as:  APRESOLINE Take 1 tablet (50 mg total) by mouth 3 (three) times daily.   labetalol 100 MG tablet Commonly known as:  NORMODYNE Take 1  tablet (100 mg total) by mouth 2 (two) times daily.   metoCLOPramide 10 MG tablet Commonly known as:  REGLAN Take 1 tablet (10 mg total) by mouth 4 (four) times daily. Take 1tab 20-30 minutes before breakfast, lunch, dinner and at bedtime   MYFORTIC 180 MG EC tablet Generic drug:  mycophenolate Take 360 mg by mouth 2 (two) times daily.   nicotine 21 mg/24hr patch Commonly known as:  NICODERM CQ - dosed in mg/24 hours Place 1 patch (21 mg total) onto the skin daily.   omeprazole 40 MG capsule Commonly known as:  PRILOSEC Take 1 capsule (40 mg total) by mouth daily. Take 1 cap 30-60 minutes before breakfast   ondansetron 4 MG disintegrating tablet Commonly known as:  ZOFRAN ODT Take 1 tablet (4 mg total) by mouth every 8 (eight) hours as needed for nausea or vomiting.   OxyCODONE HCl 15 MG Taba Take 15 mg by mouth 4 (four) times daily.   predniSONE 5 MG tablet Commonly known as:  DELTASONE Take 5 mg by mouth daily.   promethazine 25 MG tablet Commonly known as:  PHENERGAN Take 25 mg by mouth  every 6 (six) hours as needed for nausea or vomiting.   saccharomyces boulardii 250 MG capsule Commonly known as:  FLORASTOR Take 250 mg by mouth 2 (two) times daily.   sulfamethoxazole-trimethoprim 400-80 MG tablet Commonly known as:  BACTRIM,SEPTRA Take 1 tablet by mouth every Monday, Wednesday, and Friday.   tacrolimus 1 MG capsule Commonly known as:  PROGRAF Take 2-3 mg by mouth 2 (two) times daily. 3 mg in the morning, 2 mg in the evening      Allergies  Allergen Reactions  . Lisinopril     Cough  . Amlodipine Rash      The results of significant diagnostics from this hospitalization (including imaging, microbiology, ancillary and laboratory) are listed below for reference.    Significant Diagnostic Studies: Ct Abdomen Pelvis Wo Contrast  Result Date: 10/25/2017 CLINICAL DATA:  Generalized abdominal pain with hematemesis today. EXAM: CT ABDOMEN AND PELVIS WITHOUT  CONTRAST TECHNIQUE: Multidetector CT imaging of the abdomen and pelvis was performed following the standard protocol without IV contrast. COMPARISON:  CT 10/11/2016 FINDINGS: Lower chest: Subsegmental scarring or atelectasis in the right middle lobe. Top-normal size heart without pericardial effusion. Small hiatal hernia. Hepatobiliary: The physiologically distended gallbladder without stones. The unenhanced liver demonstrates no biliary dilatation or definite mass. Pancreas: Unremarkable and nonacute. Spleen: Negative Adrenals/Urinary Tract: Normal bilateral adrenal glands. Bilateral extensive renovascular calcifications of both kidneys with mild atrophy of the native kidneys. No significant cortical scarring or thinning. No nephrolithiasis nor obstructive uropathy. Right-sided transplanted pelvic kidney is noted. The urinary bladder is physiologically distended. Stomach/Bowel: Stomach is within normal limits. No evidence of acute appendicitis. No evidence of bowel wall thickening, distention, or inflammatory changes. There is sigmoid diverticulosis. Vascular/Lymphatic: Scattered aortic and moderate common iliac calcific atherosclerosis. Moderate atherosclerosis of the SMA and IMA. No adenopathy. Reproductive: Mildly enlarged prostate up to 5.9 cm. Normal seminal vesicles. Other: No abdominal wall hernia or abnormality. No abdominopelvic ascites. Musculoskeletal: No acute or significant osseous findings. IMPRESSION: 1. Mild native renal atrophy with extensive renovascular calcifications. Stable right transplanted pelvic kidney. No obstructive uropathy. 2. Sigmoid diverticulosis without acute diverticulitis. Electronically Signed   By: Ashley Royalty M.D.   On: 10/25/2017 22:57    Microbiology: Recent Results (from the past 240 hour(s))  MRSA PCR Screening     Status: None   Collection Time: 10/26/17  6:31 PM  Result Value Ref Range Status   MRSA by PCR NEGATIVE NEGATIVE Final    Comment:        The GeneXpert  MRSA Assay (FDA approved for NASAL specimens only), is one component of a comprehensive MRSA colonization surveillance program. It is not intended to diagnose MRSA infection nor to guide or monitor treatment for MRSA infections.      Labs: Basic Metabolic Panel:  Recent Labs Lab 10/25/17 1824 10/26/17 0432 10/28/17 0312  NA 142 142 140  K 3.9 3.9 3.6  CL 108 110 109  CO2 23 20* 20*  GLUCOSE 112* 97 82  BUN 15 14 12   CREATININE 1.59* 1.50* 1.50*  CALCIUM 9.5 8.9 8.5*   Liver Function Tests:  Recent Labs Lab 10/25/17 1824  AST 20  ALT 15*  ALKPHOS 92  BILITOT 0.6  PROT 7.8  ALBUMIN 4.1    Recent Labs Lab 10/25/17 2009  LIPASE 58*   No results for input(s): AMMONIA in the last 168 hours. CBC:  Recent Labs Lab 10/25/17 1824 10/26/17 0432 10/26/17 1531 10/26/17 1805 10/28/17 0312  WBC 6.1 7.0 7.6  9.6 9.9  NEUTROABS  --   --   --   --  7.0  HGB 17.2* 16.5 15.9 16.3 15.7  HCT 52.5* 50.1 49.1 49.9 48.7  MCV 92.4 92.6 93.2 93.4 93.7  PLT 262 258 264 245 247   Cardiac Enzymes:  Recent Labs Lab 10/26/17 0432 10/26/17 1038 10/26/17 1531  TROPONINI <0.03 <0.03 <0.03   BNP: BNP (last 3 results) No results for input(s): BNP in the last 8760 hours.  ProBNP (last 3 results) No results for input(s): PROBNP in the last 8760 hours.  CBG: No results for input(s): GLUCAP in the last 168 hours.     Signed:  Kayleen Memos, MD [facility] 10/28/2017, 11:39 AM

## 2017-10-28 NOTE — Progress Notes (Signed)
Pt discharged per MD order, all discharge instructions reviewed and all questions answered.  

## 2018-03-16 ENCOUNTER — Other Ambulatory Visit: Payer: Self-pay | Admitting: Physician Assistant

## 2018-03-22 ENCOUNTER — Other Ambulatory Visit: Payer: Self-pay | Admitting: Physician Assistant

## 2018-04-09 ENCOUNTER — Ambulatory Visit: Payer: Medicare Other | Admitting: Nurse Practitioner

## 2018-04-11 ENCOUNTER — Other Ambulatory Visit (HOSPITAL_COMMUNITY): Payer: Self-pay | Admitting: Nephrology

## 2018-04-11 ENCOUNTER — Other Ambulatory Visit: Payer: Self-pay | Admitting: Nephrology

## 2018-04-11 ENCOUNTER — Ambulatory Visit (HOSPITAL_COMMUNITY)
Admission: RE | Admit: 2018-04-11 | Discharge: 2018-04-11 | Disposition: A | Discharge status: Home / Self Care | Payer: Medicare Other | Source: Ambulatory Visit | Attending: Nephrology | Admitting: Nephrology

## 2018-04-11 DIAGNOSIS — R109 Unspecified abdominal pain: Secondary | ICD-10-CM

## 2018-04-11 DIAGNOSIS — Z9483 Pancreas transplant status: Secondary | ICD-10-CM

## 2018-04-11 DIAGNOSIS — Z940 Kidney transplant status: Secondary | ICD-10-CM

## 2018-04-11 DIAGNOSIS — D751 Secondary polycythemia: Secondary | ICD-10-CM

## 2018-04-11 DIAGNOSIS — Z09 Encounter for follow-up examination after completed treatment for conditions other than malignant neoplasm: Secondary | ICD-10-CM | POA: Diagnosis not present

## 2018-04-11 DIAGNOSIS — R140 Abdominal distension (gaseous): Secondary | ICD-10-CM

## 2018-06-04 ENCOUNTER — Encounter: Payer: Self-pay | Admitting: Nurse Practitioner

## 2018-06-04 ENCOUNTER — Ambulatory Visit (INDEPENDENT_AMBULATORY_CARE_PROVIDER_SITE_OTHER): Payer: Medicare Other | Admitting: Nurse Practitioner

## 2018-06-04 VITALS — BP 138/80 | HR 76 | Temp 98.3°F | Resp 16 | Ht 72.0 in | Wt 203.0 lb

## 2018-06-04 DIAGNOSIS — Z89511 Acquired absence of right leg below knee: Secondary | ICD-10-CM | POA: Diagnosis not present

## 2018-06-04 DIAGNOSIS — Z89512 Acquired absence of left leg below knee: Secondary | ICD-10-CM | POA: Diagnosis not present

## 2018-06-04 NOTE — Progress Notes (Signed)
Name: [name]   MRN: [id number]    DOB: 05-10-1976   Date:06/04/2018       Progress Note  Subjective  Chief Complaint  Chief Complaint  Patient presents with  . Establish Care    rx for prosthetic liners and supplies to [employer]    HPI [name] is here today to establish care with me, transferring from another provider in the same clinic. 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  He is here today to request prosthesis supplies.  He had bilateral BKA over 15 years ago, and is able to ambulate and function independently with his BLE prosthesis. He is in need of replacement prosthetic liners and supplies due to normal wear and tear of prosthetic supplies. Denies skin breakdown, pain. Overall feels well today.  Patient Active Problem List   Diagnosis Date Noted  . CKD (chronic kidney disease), stage III ([place]) 10/26/2017  . Abnormal EKG 10/26/2017  . Hypertension 08/22/2017  . Acute GI bleeding 07/17/2017  . Hypertensive urgency 12/29/2016  . Hematemesis   . Healthcare maintenance 08/26/2016  . Back pain 12/15/2015  . History of diabetes mellitus 02/20/2015  . Weakness of left upper extremity 07/03/2014  . Impaired mobility and activities of daily living 07/03/2014  . Sternoclavicular joint pain 03/04/2014  . Renal transplant, status post 07/19/2013  . History of pancreas transplant ([place]) 07/19/2013  . Immunosuppressed status ([place]) 06/27/2013  . H/O pancreas transplant ([place]) 06/27/2013  . Diabetic gastroparesis associated with type 1 diabetes mellitus ([place]) 05/08/2013  . BKA stump complication ([place]) 34/28/7681  . S/P bilateral BKA (below knee amputation) ([place]) 08/17/2012  .  Metabolic bone disease 15/72/6203  . ESRD (end stage renal disease) ([place]) 11/17/2009  . GERD 03/14/2007  . HLD (hyperlipidemia) 02/22/2007  . Major depressive disorder, recurrent episode ([place]) 02/22/2007  . POST TRAUMATIC STRESS DISORDER 02/22/2007  . 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 AV FISTULA PLACEMENT  08/2011   left upper arm  . BELOW KNEE LEG AMPUTATION  "it's been awhile"   bilaterally  . CATARACT EXTRACTION  ~ 2011   right  . COMBINED KIDNEY-PANCREAS TRANSPLANT    . ESOPHAGOGASTRODUODENOSCOPY N/A 12/30/2016   Procedure: ESOPHAGOGASTRODUODENOSCOPY (EGD);  Surgeon: Carol Ada, MD;  Location: [facility] ENDOSCOPY;  Service: Endoscopy;  Laterality: N/A;    Family History  Problem Relation Age of Onset  . Diabetes Other   . Hypertension Other   . Hypertension Mother   . Diabetes Mother   . Kidney disease Mother   . Diabetes Maternal Grandmother   . Diabetes Paternal Grandmother   . Colon cancer Neg Hx   . Esophageal cancer Neg Hx   . Rectal cancer Neg Hx   . Stomach cancer Neg Hx     Social History   Socioeconomic History  . Marital status: Married    Spouse name: Not on file  . Number of children: 1  . Years of education: 4  . Highest education level: Not on file  Occupational History  . Occupation: Disability  Social Needs  . Financial resource strain: Not on file  . Food insecurity:    Worry: Not on file    Inability: Not on file  . Transportation  needs:    Medical: Not on file    Non-medical: Not on file  Tobacco Use  . Smoking status: Current Some Day Smoker  . Smokeless tobacco: Never Used  Substance and Sexual Activity  . Alcohol use: No  . Drug use: Yes    Frequency: 3.0 times per week    Types: Marijuana    Comment: Occasionally  . Sexual activity: Not Currently  Lifestyle  . Physical activity:    Days per week: Not on file    Minutes per session: Not on file  . Stress: Not on file   Relationships  . Social connections:    Talks on phone: Not on file    Gets together: Not on file    Attends religious service: Not on file    Active member of club or organization: Not on file    Attends meetings of clubs or organizations: Not on file    Relationship status: Not on file  . Intimate partner violence:    Fear of current or ex partner: Not on file    Emotionally abused: Not on file    Physically abused: Not on file    Forced sexual activity: Not on file  Other Topics Concern  . Not on file  Social History Narrative   Fun: Restore old cars      Current Outpatient Medications:  .  aspirin EC 81 MG tablet, Take 81 mg by mouth daily., Disp: , Rfl:  .  hydrALAZINE (APRESOLINE) 50 MG tablet, Take 1 tablet (50 mg total) by mouth 3 (three) times daily., Disp: 30 tablet, Rfl: 0 .  labetalol (NORMODYNE) 100 MG tablet, Take 1 tablet (100 mg total) by mouth 2 (two) times daily., Disp: 30 tablet, Rfl: 0 .  metoCLOPramide (REGLAN) 10 MG tablet, Take 1 tablet (10 mg total) by mouth 4 (four) times daily. Take 1tab 20-30 minutes before breakfast, lunch, dinner and at bedtime, Disp: 120 tablet, Rfl: 1 .  mycophenolate (MYFORTIC) 180 MG EC tablet, Take 360 mg by mouth 2 (two) times daily. , Disp: , Rfl:  .  omeprazole (PRILOSEC) 40 MG capsule, TAKE ONE CAPSULE BY MOUTH DAILY 30TO 60 MINUTES BEFORE BREAKFAST, Disp: 90 capsule, Rfl: 0 .  omeprazole (PRILOSEC) 40 MG capsule, TAKE ONE CAPSULE BY MOUTH DAILY 30TO 60 MINUTES BEFORE BREAKFAST, Disp: 90 capsule, Rfl: 0 .  ondansetron (ZOFRAN ODT) 4 MG disintegrating tablet, Take 1 tablet (4 mg total) by mouth every 8 (eight) hours as needed for nausea or vomiting., Disp: 20 tablet, Rfl: 0 .  OxyCODONE HCl 15 MG TABA, Take 15 mg by mouth 4 (four) times daily., Disp: , Rfl:  .  predniSONE (DELTASONE) 5 MG tablet, Take 5 mg by mouth daily. , Disp: , Rfl:  .  promethazine (PHENERGAN) 25 MG tablet, Take 25 mg by mouth every 6 (six) hours as needed  for nausea or vomiting., Disp: , Rfl:  .  saccharomyces boulardii (FLORASTOR) 250 MG capsule, Take 250 mg by mouth 2 (two) times daily., Disp: , Rfl:  .  sulfamethoxazole-trimethoprim (BACTRIM,SEPTRA) 400-80 MG per tablet, Take 1 tablet by mouth every Monday, Wednesday, and Friday. , Disp: , Rfl:  .  tacrolimus (PROGRAF) 1 MG capsule, Take 2-3 mg by mouth 2 (two) times daily. 3 mg in the morning, 2 mg in the evening, Disp: , Rfl:   Allergies  Allergen Reactions  . Lisinopril     Cough  . Amlodipine Rash     ROS See HPI  Objective  Vitals:   06/04/18 0959  BP: 138/80  Pulse: 76  Resp: 16  Temp: 98.3 F (36.8 C)  TempSrc: Oral  SpO2: 96%  Weight: 203 lb (92.1 kg)  Height: 6' (1.829 m)    Body mass index is 27.53 kg/m.  Physical Exam Vital signs reviewed. Constitutional: Patient appears well-developed and well-nourished. No distress.  HENT: Head: Normocephalic and atraumatic. Nose: Nose normal. Mouth/Throat: Oropharynx is clear and moist. No oropharyngeal exudate.  Eyes: Conjunctivae and EOM are normal. Pupils are equal, round, and reactive to light. No scleral icterus.  Neck: Normal range of motion. Neck supple.  Cardiovascular: Normal rate, regular rhythm and normal heart sounds.   Distal pulses intact. Pulmonary/Chest: Effort normal and breath sounds normal. No respiratory distress. Musculoskeletal: Bilateral BKA. Upper extremities with normal range of motion, no gross defromities Neurological: He is alert and oriented to person, place, and time.Coordination, balance, strength, speech and gait are normal.  Skin: Skin is warm and dry. No rash noted. No erythema.  Psychiatric: Patient has a normal mood and affect. behavior is normal. Judgment and thought content normal.   Assessment & Plan It appears that his preventive and routine care needs are being met by specialty providers at this time per epic/care everywhere chart review I will continue to help him with any  acute care needs and he will let me know In the future of any additional care needs I can help him with

## 2018-06-04 NOTE — Assessment & Plan Note (Signed)
Printed Rx for replacement prosthetic liners and supplies F/U as needed

## 2018-06-04 NOTE — Patient Instructions (Signed)
I will plan to see you back as needed for acute care needs, but please let me know if you need any assistance with chronic care needs in the future.  It was nice to meet you.  Have fun on your trip!

## 2018-09-11 ENCOUNTER — Ambulatory Visit: Payer: Self-pay

## 2018-09-11 NOTE — Telephone Encounter (Signed)
Patient called in with c/o "swollen left elbow." He says "I noticed it about 3-4 days ago. The swelling covers my entire elbow and fluid is felt in it. 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 It's only painful when I sleep by putting pressure on it." I asked about other symptoms, he denies. According to protocol, see PCP within 24 hours, no availability with PCP, appointment scheduled for tomorrow at 1300 with Jodi Mourning, FNP, care advice given, patient verbalized understanding.   Reason for Disposition . Looks like a boil, infected sore, deep ulcer or other infected rash (spreading redness, pus)  Answer Assessment - Initial Assessment Questions 1. LOCATION: "Where is the swelling?" (e.g., left, right, both elbows)     Left elbow 2. SIZE and DESCRIPTION: "What does the swelling look like?" (e.g., entire elbow, localized)     Covers entire elbow, feel the fluid in it 3. ONSET: "When did the swelling start?" "Does it come and go, or is it there all the time?"     3-4 days ago 4. SETTING: "Has there been any recent work, exercise or other activity that involved that part of the body?"      No 5. AGGRAVATING FACTORS: "What makes the elbow swelling worse?" (e.g., work, sports activities)     I don't know 6. ASSOCIATED SYMPTOMS: "Is there any pain or redness?"     Pain when sleep; unable to tell redness 7. OTHER SYMPTOMS: "Do you have any other symptoms?" (e.g., fever)     No 8. PREGNANCY: "Is there any chance you are pregnant?" "When was your last menstrual period?"     N/A  Protocols used: ELBOW SWELLING-A-AH

## 2018-09-12 ENCOUNTER — Ambulatory Visit (INDEPENDENT_AMBULATORY_CARE_PROVIDER_SITE_OTHER): Payer: Medicare Other | Admitting: Family

## 2018-09-12 ENCOUNTER — Encounter: Payer: Self-pay | Admitting: Family

## 2018-09-12 VITALS — BP 110/70 | HR 66 | Temp 98.3°F | Ht 72.0 in | Wt 202.1 lb

## 2018-09-12 DIAGNOSIS — Z23 Encounter for immunization: Secondary | ICD-10-CM | POA: Diagnosis not present

## 2018-09-12 DIAGNOSIS — M7022 Olecranon bursitis, left elbow: Secondary | ICD-10-CM

## 2018-09-12 NOTE — Patient Instructions (Signed)
Bursitis Bursitis is when the fluid-filled sac (bursa) that covers and protects a joint is swollen (inflamed). Bursitis is most common near joints, especially the knees, elbows, hips, and shoulders. Follow these instructions at home:  Take medicines only as told by your doctor.  If you were prescribed an antibiotic medicine, finish it all even if you start to feel better.  Rest the affected area as told by your doctor. ? Keep the area raised up. ? Avoid doing things that make the pain worse.  Apply ice to the injured area: ? Place ice in a plastic bag. ? Place a towel between your skin and the bag. ? Leave the ice on for 20 minutes, 2-3 times a day.  Use splints, braces, pads, or walking aids as told by your doctor.  Keep all follow-up visits as told by your doctor. This is important. Contact a doctor if:  You have more pain with home care.  You have a fever.  You have chills. This information is not intended to replace advice given to you by your health care provider. Make sure you discuss any questions you have with your health care provider. Document Released: 06/01/2010 Document Revised: 05/19/2016 Document Reviewed: 03/03/2014 Elsevier Interactive Patient Education  2018 [employer].

## 2018-09-12 NOTE — Addendum Note (Signed)
Addended by: Marcina Millard on: 09/12/2018 02:51 PM   Modules accepted: Orders

## 2018-09-12 NOTE — Progress Notes (Signed)
[name] is a 42 y.o. male with the following history as recorded in EpicCare:  Patient Active Problem List   Diagnosis Date Noted  . CKD (chronic kidney disease), stage III ([place]) 10/26/2017  . Abnormal EKG 10/26/2017  . Hypertension 08/22/2017  . Acute GI bleeding 07/17/2017  . Hypertensive urgency 12/29/2016  . Hematemesis   . Healthcare maintenance 08/26/2016  . Back pain 12/15/2015  . History of diabetes mellitus 02/20/2015  . Weakness of left upper extremity 07/03/2014  . Impaired mobility and activities of daily living 07/03/2014  . Sternoclavicular joint pain 03/04/2014  . Renal transplant, status post 07/19/2013  . History of pancreas transplant ([place]) 07/19/2013  . Immunosuppressed status ([place]) 06/27/2013  . H/O pancreas transplant ([place]) 06/27/2013  . Diabetic gastroparesis associated with type 1 diabetes mellitus ([place]) 05/08/2013  . BKA stump complication ([place]) 18/29/9371  . S/P bilateral BKA (below knee amputation) ([place]) 08/17/2012  . Metabolic bone disease 69/67/8938  . ESRD (end stage renal disease) ([place]) 11/17/2009  . GERD 03/14/2007  . HLD (hyperlipidemia) 02/22/2007  . Major depressive disorder, recurrent episode ([place]) 02/22/2007  . POST TRAUMATIC STRESS DISORDER 02/22/2007  . 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 aspirin EC 81 MG tablet Take 81 mg by mouth daily.    . hydrALAZINE (APRESOLINE) 50 MG tablet Take 1 tablet (50 mg total) by mouth 3 (three) times daily. 30 tablet 0  . labetalol (NORMODYNE) 200 MG tablet TK 1 T PO BID  6  . metoCLOPramide (REGLAN) 10 MG tablet Take 1 tablet (10 mg total) by mouth 4 (four) times daily. Take 1tab 20-30 minutes before breakfast, lunch, dinner and at bedtime 120 tablet 1  . mycophenolate (MYFORTIC) 180 MG EC tablet Take 360 mg by mouth 2 (two) times daily.     . ondansetron (ZOFRAN ODT) 4 MG disintegrating tablet Take 1 tablet (4 mg total) by mouth  every 8 (eight) hours as needed for nausea or vomiting. 20 tablet 0  . OxyCODONE HCl 15 MG TABA Take 15 mg by mouth 4 (four) times daily.    . predniSONE (DELTASONE) 5 MG tablet Take 5 mg by mouth daily.     . promethazine (PHENERGAN) 25 MG tablet Take 25 mg by mouth every 6 (six) hours as needed for nausea or vomiting.    . saccharomyces boulardii (FLORASTOR) 250 MG capsule Take 250 mg by mouth 2 (two) times daily.    [name] sulfamethoxazole-trimethoprim (BACTRIM,SEPTRA) 400-80 MG per tablet Take 1 tablet by mouth every Monday, Wednesday, and Friday.     . tacrolimus (PROGRAF) 1 MG capsule Take 2-3 mg by mouth 2 (two) times daily. 3 mg in the morning, 2 mg in the evening    . labetalol (NORMODYNE) 100 MG tablet Take 1 tablet (100 mg total) by mouth 2 (two) times daily. (Patient not taking: Reported on 09/12/2018) 30 tablet 0   No current facility-administered medications for this visit.     Allergies: Lisinopril and Amlodipine  Past Medical History:  Diagnosis Date  . 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 Arthritis    "I think I do; just in my fingers & my hands"  . Blood transfusion   . Cataract   . Chronic pain   . Depression    Patient states he has never been depressed.  . Diabetes mellitus without complication ([place])    no since pancreas transplant  . Dialysis patient [facility]) 04/18/12   "  [facility]; Tues, [place], [place]"  . Edema   . ESRD (end stage renal disease) on dialysis ([place])   . Gastroparesis   . Gastropathy   . GERD (gastroesophageal reflux disease)   . Headache(784.0)   . Hypertension     Past Surgical History:  Procedure Laterality Date  . AV FISTULA PLACEMENT  08/2011   left upper arm  . BELOW KNEE LEG AMPUTATION  "it's been awhile"   bilaterally  . CATARACT EXTRACTION  ~ 2011   right  . COMBINED KIDNEY-PANCREAS TRANSPLANT    . ESOPHAGOGASTRODUODENOSCOPY N/A 12/30/2016   Procedure: ESOPHAGOGASTRODUODENOSCOPY (EGD);  Surgeon: Carol Ada, MD;   Location: [facility] ENDOSCOPY;  Service: Endoscopy;  Laterality: N/A;    Family History  Problem Relation Age of Onset  . Diabetes Other   . Hypertension Other   . Hypertension Mother   . Diabetes Mother   . Kidney disease Mother   . Diabetes Maternal Grandmother   . Diabetes Paternal Grandmother   . Colon cancer Neg Hx   . Esophageal cancer Neg Hx   . Rectal cancer Neg Hx   . Stomach cancer Neg Hx     Social History   Tobacco Use  . Smoking status: Current Some Day Smoker  . Smokeless tobacco: Never Used  Substance Use Topics  . Alcohol use: No    Subjective:   Left elbow pain/ swelling x 4-5 days; denies any injury or trauma to the elbow; denies any repetitive motion to the elbow; denies any pain; has had bursitis in the past;   Objective:  Vitals:   09/12/18 1301  BP: 110/70  Pulse: 66  Temp: 98.3 F (36.8 C)  TempSrc: Oral  SpO2: 97%  Weight: 202 lb 1.3 oz (91.7 kg)  Height: 6' (1.829 m)    General: Well developed, well nourished, in no acute distress  Skin : Warm and dry.  Head: Normocephalic and atraumatic  Lungs: Respirations unlabored; clear to auscultation bilaterally without wheeze, rales, rhonchi  Musculoskeletal: No deformities; swelling noted over elbow Extremities: No edema, cyanosis, clubbing  Vessels: Symmetric bilaterally  Neurologic: Alert and oriented; speech intact; face symmetrical; moves all extremities well; CNII-XII intact without focal deficit  Assessment:  1. Olecranon bursitis of left elbow     Plan:  Prefer not to use NSAIDs; return 1 day with Dr. Raeford Razor to have elbow treated;  Flu shot given today;    Return in about 1 day (around 09/13/2018) for Dr. Schmitz/ bursitis elbow.  No orders of the defined types were placed in this encounter.   Requested Prescriptions    No prescriptions requested or ordered in this encounter

## 2018-09-13 ENCOUNTER — Encounter: Payer: Self-pay | Admitting: Family Medicine

## 2018-09-13 ENCOUNTER — Ambulatory Visit (INDEPENDENT_AMBULATORY_CARE_PROVIDER_SITE_OTHER): Payer: Medicare Other | Admitting: Family Medicine

## 2018-09-13 VITALS — BP 116/68 | HR 96 | Ht 72.0 in | Wt 201.0 lb

## 2018-09-13 DIAGNOSIS — M7022 Olecranon bursitis, left elbow: Secondary | ICD-10-CM

## 2018-09-13 NOTE — Patient Instructions (Signed)
Nice to meet you  Please keep the elbow wrapped through the course of the day.  Please try to ice the area  Please follow up if the area becomes swollen or red.

## 2018-09-13 NOTE — Progress Notes (Signed)
KORION CUEVAS - 42 y.o. male MRN [id number]  Date of birth: 07/17/1976  SUBJECTIVE:  Including CC & ROS.  Chief Complaint  Patient presents with  . Elbow Pain    Rishit THELTON GRACA is a 42 y.o. male that is presenting with left elbow swelling. Ongoing for one week. He noticed the swelling one morning when he put his elbow on a chair. Denies pain or tenderness. Denies warmth or redness. Denies injury or an inciting event.  No fevers.  No history of previous symptoms.    Review of Systems  Constitutional: Negative for fever.  HENT: Negative for congestion.   Respiratory: Negative for cough.   Cardiovascular: Negative for chest pain.  Gastrointestinal: Negative for abdominal pain.  Musculoskeletal: Positive for gait problem.  Skin: Negative for color change.  Neurological: Negative for weakness.  Hematological: Negative for adenopathy.  Psychiatric/Behavioral: Negative for agitation.    HISTORY: Past Medical, Surgical, Social, and Family History Reviewed & Updated per EMR.   Pertinent Historical Findings include:  Past Medical History:  Diagnosis Date  . 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 Arthritis    "I think I do; just in my fingers & my hands"  . Blood transfusion   . Cataract   . Chronic pain   . Depression    Patient states he has never been depressed.  . Diabetes mellitus without complication ([place])    no since pancreas transplant  . Dialysis patient [facility]) 04/18/12   "[facility]; Tues, [place], [place]"  . Edema   . ESRD (end stage renal disease) on dialysis ([place])   . Gastroparesis   . Gastropathy   . GERD (gastroesophageal reflux disease)   . Headache(784.0)   . Hypertension     Past Surgical History:  Procedure Laterality Date  . AV FISTULA PLACEMENT  08/2011   left upper arm  . BELOW KNEE LEG AMPUTATION  "it's been awhile"   bilaterally  . CATARACT EXTRACTION  ~ 2011   right  . COMBINED KIDNEY-PANCREAS TRANSPLANT    .  ESOPHAGOGASTRODUODENOSCOPY N/A 12/30/2016   Procedure: ESOPHAGOGASTRODUODENOSCOPY (EGD);  Surgeon: Carol Ada, MD;  Location: [facility] ENDOSCOPY;  Service: Endoscopy;  Laterality: N/A;    Allergies  Allergen Reactions  . Lisinopril     Cough  . Amlodipine Rash    Family History  Problem Relation Age of Onset  . Diabetes Other   . Hypertension Other   . Hypertension Mother   . Diabetes Mother   . Kidney disease Mother   . Diabetes Maternal Grandmother   . Diabetes Paternal Grandmother   . Colon cancer Neg Hx   . Esophageal cancer Neg Hx   . Rectal cancer Neg Hx   . Stomach cancer Neg Hx      Social History   Socioeconomic History  . Marital status: Married    Spouse name: Not on file  . Number of children: 1  . Years of education: 57  . Highest education level: Not on file  Occupational History  . Occupation: Disability  Social Needs  . Financial resource strain: Not on file  . Food insecurity:    Worry: Not on file    Inability: Not on file  . Transportation needs:    Medical: Not on file    Non-medical: Not on file  Tobacco Use  . Smoking status: Current Some Day Smoker  . Smokeless tobacco: Never Used  Substance and Sexual Activity  . Alcohol use: No  .  Drug use: Yes    Frequency: 3.0 times per week    Types: Marijuana    Comment: Occasionally  . Sexual activity: Not Currently  Lifestyle  . Physical activity:    Days per week: Not on file    Minutes per session: Not on file  . Stress: Not on file  Relationships  . Social connections:    Talks on phone: Not on file    Gets together: Not on file    Attends religious service: Not on file    Active member of club or organization: Not on file    Attends meetings of clubs or organizations: Not on file    Relationship status: Not on file  . Intimate partner violence:    Fear of current or ex partner: Not on file    Emotionally abused: Not on file    Physically abused: Not on file    Forced sexual activity:  Not on file  Other Topics Concern  . Not on file  Social History Narrative   Fun: Restore old cars      PHYSICAL EXAM:  VS: BP 116/68   Pulse 96   Ht 6' (1.829 m)   Wt 201 lb (91.2 kg)   SpO2 99%   BMI 27.26 kg/m  Physical Exam Gen: NAD, alert, cooperative with exam, well-appearing ENT: normal lips, normal nasal mucosa,  Eye: normal EOM, normal conjunctiva and lids CV:  no edema, +2 pedal pulses   Resp: no accessory muscle use, non-labored,  Skin: no rashes, no areas of induration  Neuro: normal tone, normal sensation to touch Psych:  normal insight, alert and oriented MSK:  Left elbow: Olecranon bursitis present. No redness or warmth. Normal elbow range of motion. Normal grip strength. Neurovascular intact   Aspiration/Injection Procedure Note FERRON ISHMAEL April 04, 1976  Procedure: Aspiration Indications: Left elbow olecranon bursitis  Procedure Details Consent: Risks of procedure as well as the alternatives and risks of each were explained to the (patient/caregiver).  Consent for procedure obtained. Time Out: Verified patient identification, verified procedure, site/side was marked, verified correct patient position, special equipment/implants available, medications/allergies/relevent history reviewed, required imaging and test results available.  Performed.  The area was cleaned with iodine and alcohol swabs.    The left elbow olecranon bursitis was aspirated with an 18-gauge inch and a half needle.  Ultrasound was used. Images were obtained in  Long views showing the injection.    Amount of Fluid Aspirated: 48mL Character of Fluid: bloody Fluid was sent for:n/a A sterile dressing was applied.  Patient did tolerate procedure well.        ASSESSMENT & PLAN:   Olecranon bursitis of left elbow Does not appear to be infectious today.   -Aspiration today. -Counseled on compression -Given indications to follow-up.

## 2018-09-16 DIAGNOSIS — M7022 Olecranon bursitis, left elbow: Secondary | ICD-10-CM | POA: Insufficient documentation

## 2018-09-16 NOTE — Assessment & Plan Note (Signed)
Does not appear to be infectious today.   -Aspiration today. -Counseled on compression -Given indications to follow-up.

## 2018-10-29 ENCOUNTER — Other Ambulatory Visit: Payer: Self-pay | Admitting: Internal Medicine

## 2019-06-19 ENCOUNTER — Other Ambulatory Visit: Payer: Self-pay

## 2019-06-19 ENCOUNTER — Ambulatory Visit (INDEPENDENT_AMBULATORY_CARE_PROVIDER_SITE_OTHER): Payer: Medicare Other | Admitting: Family

## 2019-06-19 ENCOUNTER — Encounter: Payer: Self-pay | Admitting: Family

## 2019-06-19 VITALS — BP 130/80 | HR 67 | Temp 98.5°F | Ht 72.0 in | Wt 201.0 lb

## 2019-06-19 DIAGNOSIS — Z89512 Acquired absence of left leg below knee: Secondary | ICD-10-CM

## 2019-06-19 DIAGNOSIS — Z940 Kidney transplant status: Secondary | ICD-10-CM | POA: Diagnosis not present

## 2019-06-19 DIAGNOSIS — Z89511 Acquired absence of right leg below knee: Secondary | ICD-10-CM | POA: Diagnosis not present

## 2019-06-19 NOTE — Progress Notes (Signed)
[name] is a 43 y.o. male with the following history as recorded in EpicCare:  Patient Active Problem List   Diagnosis Date Noted  . Olecranon bursitis of left elbow 09/16/2018  . CKD (chronic kidney disease), stage III ([place]) 10/26/2017  . Abnormal EKG 10/26/2017  . Hypertension 08/22/2017  . Acute GI bleeding 07/17/2017  . Hypertensive urgency 12/29/2016  . Hematemesis   . Healthcare maintenance 08/26/2016  . Back pain 12/15/2015  . History of diabetes mellitus 02/20/2015  . Weakness of left upper extremity 07/03/2014  . Impaired mobility and activities of daily living 07/03/2014  . Sternoclavicular joint pain 03/04/2014  . Renal transplant, status post 07/19/2013  . History of pancreas transplant ([place]) 07/19/2013  . Immunosuppressed status ([place]) 06/27/2013  . H/O pancreas transplant ([place]) 06/27/2013  . Diabetic gastroparesis associated with type 1 diabetes mellitus ([place]) 05/08/2013  . BKA stump complication ([place]) 82/80/0349  . S/P bilateral BKA (below knee amputation) ([place]) 08/17/2012  . Metabolic bone disease 17/91/5056  . ESRD (end stage renal disease) ([place]) 11/17/2009  . GERD 03/14/2007  . HLD (hyperlipidemia) 02/22/2007  . Major depressive disorder, recurrent episode ([place]) 02/22/2007  . POST TRAUMATIC STRESS DISORDER 02/22/2007  . 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 aspirin EC 81 MG tablet Take 81 mg by mouth daily.    . hydrALAZINE (APRESOLINE) 50 MG tablet Take 1 tablet (50 mg total) by mouth 3 (three) times daily. 30 tablet 0  . labetalol (NORMODYNE) 200 MG tablet TK 1 T PO BID  6  . metoCLOPramide (REGLAN) 10 MG tablet Take 1 tablet (10 mg total) by mouth 4 (four) times daily. Take 1tab 20-30 minutes before breakfast, lunch, dinner and at bedtime 120 tablet 1  . mycophenolate (MYFORTIC) 180 MG EC tablet Take 360 mg by mouth 2 (two) times daily.     [name] omeprazole (PRILOSEC) 40 MG capsule TAKE 1  CAPSULE BY MOUTH DAILY 30 TO 60 MINUTES BEFORE BREAKFAST 90 capsule 0  . ondansetron (ZOFRAN ODT) 4 MG disintegrating tablet Take 1 tablet (4 mg total) by mouth every 8 (eight) hours as needed for nausea or vomiting. 20 tablet 0  . ondansetron (ZOFRAN) 4 MG tablet TK 1 T PO BID PRN    . OxyCODONE HCl 15 MG TABA Take 15 mg by mouth 4 (four) times daily.    . predniSONE (DELTASONE) 5 MG tablet Take 5 mg by mouth daily.     . promethazine (PHENERGAN) 25 MG tablet Take 25 mg by mouth every 6 (six) hours as needed for nausea or vomiting.    . saccharomyces boulardii (FLORASTOR) 250 MG capsule Take 250 mg by mouth 2 (two) times daily.    [name] sulfamethoxazole-trimethoprim (BACTRIM,SEPTRA) 400-80 MG per tablet Take 1 tablet by mouth every Monday, Wednesday, and Friday.     . tacrolimus (PROGRAF) 1 MG capsule Take 2-3 mg by mouth 2 (two) times daily. 3 mg in the morning, 2 mg in the evening    . labetalol (NORMODYNE) 100 MG tablet Take 1 tablet (100 mg total) by mouth 2 (two) times daily. (Patient not taking: Reported on 06/19/2019) 30 tablet 0   No current facility-administered medications for this visit.     Allergies: Lisinopril and Amlodipine  Past Medical History:  Diagnosis Date  . 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 Arthritis    "I think I do; just in my fingers & my hands"  . Blood  transfusion   . Cataract   . Chronic pain   . Depression    Patient states he has never been depressed.  . Diabetes mellitus without complication ([place])    no since pancreas transplant  . Dialysis patient [facility]) 04/18/12   "[facility]; Tues, [place], [place]"  . Edema   . ESRD (end stage renal disease) on dialysis ([place])   . Gastroparesis   . Gastropathy   . GERD (gastroesophageal reflux disease)   . Headache(784.0)   . Hypertension     Past Surgical History:  Procedure Laterality Date  . AV FISTULA PLACEMENT  08/2011   left upper arm  . BELOW KNEE LEG AMPUTATION  "it's been awhile"    bilaterally  . CATARACT EXTRACTION  ~ 2011   right  . COMBINED KIDNEY-PANCREAS TRANSPLANT    . ESOPHAGOGASTRODUODENOSCOPY N/A 12/30/2016   Procedure: ESOPHAGOGASTRODUODENOSCOPY (EGD);  Surgeon: Carol Ada, MD;  Location: [facility] ENDOSCOPY;  Service: Endoscopy;  Laterality: N/A;    Family History  Problem Relation Age of Onset  . Diabetes Other   . Hypertension Other   . Hypertension Mother   . Diabetes Mother   . Kidney disease Mother   . Diabetes Maternal Grandmother   . Diabetes Paternal Grandmother   . Colon cancer Neg Hx   . Esophageal cancer Neg Hx   . Rectal cancer Neg Hx   . Stomach cancer Neg Hx     Social History   Tobacco Use  . Smoking status: Current Some Day Smoker  . Smokeless tobacco: Never Used  Substance Use Topics  . Alcohol use: No    Subjective:  He is here today to request prosthesis supplies.  He had bilateral BKA over 15 years ago, and is able to ambulate and function independently with his BLE prosthesis. He is in need of replacement prosthetic liners and supplies due to normal wear and tear of prosthetic supplies. Denies skin breakdown, pain. Overall feels well today.  Sees his nephrologist at [facility] every 3 months; transplant specialist at [facility] yearly; does need updated referral to dermatology due to immunocompromised status.    Objective:  Vitals:   06/19/19 1210  BP: 130/80  Pulse: 67  Temp: 98.5 F (36.9 C)  TempSrc: Oral  SpO2: 97%  Weight: 201 lb (91.2 kg)  Height: 6' (1.829 m)    General: Well developed, well nourished, in no acute distress  Skin : Warm and dry.  Head: Normocephalic and atraumatic  Lungs: Respirations unlabored;  Musculoskeletal: Bilateral BKA Neurologic: Alert and oriented; speech intact; face symmetrical; moves all extremities well; CNII-XII intact without focal deficit   Assessment:  1. Renal transplant, status post   2. S/P bilateral BKA (below knee amputation) ([place])     Plan:  Order written  as requested; referral to dermatology as requested; As majority of his healthcare needs are managed by his specialist, he will follow-up here as needed for acute care needs.   No follow-ups on file.  Orders Placed This Encounter  Procedures  . For home use only DME Other see comment    Prosthetic sleeves and supplies    Order Specific Question:   Length of Need    Answer:   12 Months  . Ambulatory referral to Dermatology    Referral Priority:   Routine    Referral Type:   Consultation    Referral Reason:   Specialty Services Required    Requested Specialty:   Dermatology  Number of Visits Requested:   1    Requested Prescriptions    No prescriptions requested or ordered in this encounter

## 2020-01-02 ENCOUNTER — Ambulatory Visit: Payer: Medicare Other | Attending: Internal Medicine

## 2020-01-02 ENCOUNTER — Other Ambulatory Visit: Payer: Self-pay

## 2020-01-02 DIAGNOSIS — Z20822 Contact with and (suspected) exposure to covid-19: Secondary | ICD-10-CM

## 2020-01-04 LAB — NOVEL CORONAVIRUS, NAA: SARS-CoV-2, NAA: NOT DETECTED

## 2020-01-16 ENCOUNTER — Encounter (HOSPITAL_COMMUNITY): Payer: Self-pay | Admitting: Anesthesiology

## 2020-01-16 ENCOUNTER — Encounter (HOSPITAL_COMMUNITY): Payer: Self-pay

## 2020-01-16 ENCOUNTER — Inpatient Hospital Stay (HOSPITAL_COMMUNITY)
Admission: EM | Admit: 2020-01-16 | Discharge: 2020-01-20 | DRG: 177 | Disposition: A | Discharge status: Home / Self Care | Payer: Medicare Other | Attending: Internal Medicine | Admitting: Internal Medicine

## 2020-01-16 ENCOUNTER — Other Ambulatory Visit: Payer: Self-pay

## 2020-01-16 DIAGNOSIS — Z9483 Pancreas transplant status: Secondary | ICD-10-CM

## 2020-01-16 DIAGNOSIS — R112 Nausea with vomiting, unspecified: Secondary | ICD-10-CM

## 2020-01-16 DIAGNOSIS — N179 Acute kidney failure, unspecified: Secondary | ICD-10-CM | POA: Diagnosis not present

## 2020-01-16 DIAGNOSIS — E860 Dehydration: Secondary | ICD-10-CM | POA: Diagnosis present

## 2020-01-16 DIAGNOSIS — K920 Hematemesis: Secondary | ICD-10-CM | POA: Diagnosis not present

## 2020-01-16 DIAGNOSIS — R109 Unspecified abdominal pain: Secondary | ICD-10-CM | POA: Diagnosis present

## 2020-01-16 DIAGNOSIS — Z792 Long term (current) use of antibiotics: Secondary | ICD-10-CM

## 2020-01-16 DIAGNOSIS — Z8719 Personal history of other diseases of the digestive system: Secondary | ICD-10-CM

## 2020-01-16 DIAGNOSIS — A0839 Other viral enteritis: Secondary | ICD-10-CM | POA: Diagnosis present

## 2020-01-16 DIAGNOSIS — Z8249 Family history of ischemic heart disease and other diseases of the circulatory system: Secondary | ICD-10-CM

## 2020-01-16 DIAGNOSIS — D8481 Immunodeficiency due to conditions classified elsewhere: Secondary | ICD-10-CM | POA: Diagnosis present

## 2020-01-16 DIAGNOSIS — Y830 Surgical operation with transplant of whole organ as the cause of abnormal reaction of the patient, or of later complication, without mention of misadventure at the time of the procedure: Secondary | ICD-10-CM | POA: Diagnosis present

## 2020-01-16 DIAGNOSIS — Z7952 Long term (current) use of systemic steroids: Secondary | ICD-10-CM

## 2020-01-16 DIAGNOSIS — Z888 Allergy status to other drugs, medicaments and biological substances status: Secondary | ICD-10-CM

## 2020-01-16 DIAGNOSIS — U071 COVID-19: Principal | ICD-10-CM

## 2020-01-16 DIAGNOSIS — E1043 Type 1 diabetes mellitus with diabetic autonomic (poly)neuropathy: Secondary | ICD-10-CM | POA: Diagnosis present

## 2020-01-16 DIAGNOSIS — K2101 Gastro-esophageal reflux disease with esophagitis, with bleeding: Secondary | ICD-10-CM | POA: Diagnosis present

## 2020-01-16 DIAGNOSIS — Z79899 Other long term (current) drug therapy: Secondary | ICD-10-CM

## 2020-01-16 DIAGNOSIS — D849 Immunodeficiency, unspecified: Secondary | ICD-10-CM | POA: Diagnosis present

## 2020-01-16 DIAGNOSIS — Z89512 Acquired absence of left leg below knee: Secondary | ICD-10-CM

## 2020-01-16 DIAGNOSIS — K2971 Gastritis, unspecified, with bleeding: Secondary | ICD-10-CM | POA: Diagnosis present

## 2020-01-16 DIAGNOSIS — Z833 Family history of diabetes mellitus: Secondary | ICD-10-CM

## 2020-01-16 DIAGNOSIS — K922 Gastrointestinal hemorrhage, unspecified: Secondary | ICD-10-CM | POA: Diagnosis present

## 2020-01-16 DIAGNOSIS — Z7982 Long term (current) use of aspirin: Secondary | ICD-10-CM

## 2020-01-16 DIAGNOSIS — I10 Essential (primary) hypertension: Secondary | ICD-10-CM | POA: Diagnosis present

## 2020-01-16 DIAGNOSIS — K3184 Gastroparesis: Secondary | ICD-10-CM | POA: Diagnosis present

## 2020-01-16 DIAGNOSIS — Z79891 Long term (current) use of opiate analgesic: Secondary | ICD-10-CM

## 2020-01-16 DIAGNOSIS — Z9841 Cataract extraction status, right eye: Secondary | ICD-10-CM

## 2020-01-16 DIAGNOSIS — T8619 Other complication of kidney transplant: Secondary | ICD-10-CM | POA: Diagnosis present

## 2020-01-16 DIAGNOSIS — D751 Secondary polycythemia: Secondary | ICD-10-CM | POA: Diagnosis present

## 2020-01-16 DIAGNOSIS — G8929 Other chronic pain: Secondary | ICD-10-CM | POA: Diagnosis present

## 2020-01-16 DIAGNOSIS — Z841 Family history of disorders of kidney and ureter: Secondary | ICD-10-CM

## 2020-01-16 DIAGNOSIS — K226 Gastro-esophageal laceration-hemorrhage syndrome: Secondary | ICD-10-CM | POA: Diagnosis present

## 2020-01-16 DIAGNOSIS — Z89511 Acquired absence of right leg below knee: Secondary | ICD-10-CM

## 2020-01-16 DIAGNOSIS — F17200 Nicotine dependence, unspecified, uncomplicated: Secondary | ICD-10-CM | POA: Diagnosis present

## 2020-01-16 DIAGNOSIS — Z940 Kidney transplant status: Secondary | ICD-10-CM

## 2020-01-16 LAB — LACTATE DEHYDROGENASE: LDH: 182 U/L (ref 98–192)

## 2020-01-16 LAB — CBC WITH DIFFERENTIAL/PLATELET
Abs Immature Granulocytes: 0.02 10*3/uL (ref 0.00–0.07)
Basophils Absolute: 0 10*3/uL (ref 0.0–0.1)
Basophils Relative: 0 %
Eosinophils Absolute: 0.2 10*3/uL (ref 0.0–0.5)
Eosinophils Relative: 2 %
HCT: 57.4 % — ABNORMAL HIGH (ref 39.0–52.0)
Hemoglobin: 18.5 g/dL — ABNORMAL HIGH (ref 13.0–17.0)
Immature Granulocytes: 0 %
Lymphocytes Relative: 9 %
Lymphs Abs: 0.6 10*3/uL — ABNORMAL LOW (ref 0.7–4.0)
MCH: 31.1 pg (ref 26.0–34.0)
MCHC: 32.2 g/dL (ref 30.0–36.0)
MCV: 96.5 fL (ref 80.0–100.0)
Monocytes Absolute: 1.5 10*3/uL — ABNORMAL HIGH (ref 0.1–1.0)
Monocytes Relative: 23 %
Neutro Abs: 4.2 10*3/uL (ref 1.7–7.7)
Neutrophils Relative %: 66 %
Platelets: 220 10*3/uL (ref 150–400)
RBC: 5.95 MIL/uL — ABNORMAL HIGH (ref 4.22–5.81)
RDW: 14.5 % (ref 11.5–15.5)
WBC: 6.4 10*3/uL (ref 4.0–10.5)
nRBC: 0 % (ref 0.0–0.2)

## 2020-01-16 LAB — HEPATITIS B SURFACE ANTIGEN: Hepatitis B Surface Ag: NONREACTIVE

## 2020-01-16 LAB — COMPREHENSIVE METABOLIC PANEL
ALT: 22 U/L (ref 0–44)
AST: 22 U/L (ref 15–41)
Albumin: 3.9 g/dL (ref 3.5–5.0)
Alkaline Phosphatase: 80 U/L (ref 38–126)
Anion gap: 15 (ref 5–15)
BUN: 23 mg/dL — ABNORMAL HIGH (ref 6–20)
CO2: 24 mmol/L (ref 22–32)
Calcium: 9.4 mg/dL (ref 8.9–10.3)
Chloride: 100 mmol/L (ref 98–111)
Creatinine, Ser: 1.91 mg/dL — ABNORMAL HIGH (ref 0.61–1.24)
GFR calc Af Amer: 48 mL/min — ABNORMAL LOW (ref >60)
GFR calc non Af Amer: 41 mL/min — ABNORMAL LOW (ref >60)
Glucose, Bld: 112 mg/dL — ABNORMAL HIGH (ref 70–99)
Potassium: 4.3 mmol/L (ref 3.5–5.1)
Sodium: 139 mmol/L (ref 135–145)
Total Bilirubin: 0.6 mg/dL (ref 0.3–1.2)
Total Protein: 7.7 g/dL (ref 6.5–8.1)

## 2020-01-16 LAB — TYPE AND SCREEN
ABO/RH(D): O POS
Antibody Screen: NEGATIVE

## 2020-01-16 LAB — FIBRINOGEN: Fibrinogen: 391 mg/dL (ref 210–475)

## 2020-01-16 LAB — PROCALCITONIN: Procalcitonin: 0.15 ng/mL

## 2020-01-16 LAB — PROTIME-INR
INR: 0.9 (ref 0.8–1.2)
Prothrombin Time: 12.5 seconds (ref 11.4–15.2)

## 2020-01-16 LAB — SARS CORONAVIRUS 2 (TAT 6-24 HRS): SARS Coronavirus 2: POSITIVE — AB

## 2020-01-16 LAB — HIV ANTIBODY (ROUTINE TESTING W REFLEX): HIV Screen 4th Generation wRfx: NONREACTIVE

## 2020-01-16 LAB — C-REACTIVE PROTEIN: CRP: 1.9 mg/dL — ABNORMAL HIGH (ref <1.0)

## 2020-01-16 LAB — D-DIMER, QUANTITATIVE: D-Dimer, Quant: 1.01 ug/mL-FEU — ABNORMAL HIGH (ref 0.00–0.50)

## 2020-01-16 LAB — FERRITIN: Ferritin: 53 ng/mL (ref 24–336)

## 2020-01-16 MED ORDER — LABETALOL HCL 5 MG/ML IV SOLN
10.0000 mg | INTRAVENOUS | Status: DC | PRN
  Administered 2020-01-18: 11:00:00 10 mg via INTRAVENOUS
  Filled 2020-01-16: qty 4

## 2020-01-16 MED ORDER — SACCHAROMYCES BOULARDII 250 MG PO CAPS
250.0000 mg | ORAL_CAPSULE | Freq: Two times a day (BID) | ORAL | Status: DC
  Administered 2020-01-16 – 2020-01-20 (×8): 250 mg via ORAL
  Filled 2020-01-16 (×8): qty 1

## 2020-01-16 MED ORDER — SULFAMETHOXAZOLE-TRIMETHOPRIM 400-80 MG PO TABS
1.0000 | ORAL_TABLET | ORAL | Status: DC
  Administered 2020-01-20: 08:00:00 1 via ORAL
  Filled 2020-01-16 (×2): qty 1

## 2020-01-16 MED ORDER — PROMETHAZINE HCL 25 MG/ML IJ SOLN
25.0000 mg | Freq: Four times a day (QID) | INTRAMUSCULAR | Status: DC | PRN
  Administered 2020-01-18 (×2): 25 mg via INTRAVENOUS
  Filled 2020-01-16 (×4): qty 1

## 2020-01-16 MED ORDER — PANTOPRAZOLE SODIUM 40 MG PO TBEC: 40.0000 mg | DELAYED_RELEASE_TABLET | Freq: Every day | ORAL | Status: DC

## 2020-01-16 MED ORDER — HYDRALAZINE HCL 50 MG PO TABS
75.0000 mg | ORAL_TABLET | Freq: Two times a day (BID) | ORAL | Status: DC
  Administered 2020-01-16 – 2020-01-18 (×4): 75 mg via ORAL
  Filled 2020-01-16 (×4): qty 1

## 2020-01-16 MED ORDER — PANTOPRAZOLE SODIUM 40 MG IV SOLR
40.0000 mg | Freq: Two times a day (BID) | INTRAVENOUS | Status: DC
  Administered 2020-01-16 – 2020-01-20 (×8): 40 mg via INTRAVENOUS
  Filled 2020-01-16 (×8): qty 40

## 2020-01-16 MED ORDER — OXYCODONE HCL 5 MG PO TABS
15.0000 mg | ORAL_TABLET | Freq: Four times a day (QID) | ORAL | Status: DC
  Administered 2020-01-16: 15 mg via ORAL
  Filled 2020-01-16 (×3): qty 3

## 2020-01-16 MED ORDER — SODIUM CHLORIDE 0.9 % IV SOLN
100.0000 mg | Freq: Every day | INTRAVENOUS | Status: AC
  Administered 2020-01-17 – 2020-01-20 (×4): 100 mg via INTRAVENOUS
  Filled 2020-01-16 (×4): qty 20

## 2020-01-16 MED ORDER — PANTOPRAZOLE SODIUM 40 MG IV SOLR
40.0000 mg | Freq: Once | INTRAVENOUS | Status: AC
  Administered 2020-01-16: 40 mg via INTRAVENOUS
  Filled 2020-01-16: qty 40

## 2020-01-16 MED ORDER — ENOXAPARIN SODIUM 30 MG/0.3ML ~~LOC~~ SOLN
30.0000 mg | SUBCUTANEOUS | Status: DC
  Administered 2020-01-16: 30 mg via SUBCUTANEOUS
  Filled 2020-01-16: qty 0.3

## 2020-01-16 MED ORDER — LACTATED RINGERS IV BOLUS
1000.0000 mL | Freq: Once | INTRAVENOUS | Status: AC
  Administered 2020-01-16: 1000 mL via INTRAVENOUS

## 2020-01-16 MED ORDER — PROMETHAZINE HCL 25 MG/ML IJ SOLN
25.0000 mg | Freq: Once | INTRAMUSCULAR | Status: AC
  Administered 2020-01-16: 25 mg via INTRAVENOUS
  Filled 2020-01-16: qty 1

## 2020-01-16 MED ORDER — ONDANSETRON HCL 4 MG/2ML IJ SOLN
4.0000 mg | Freq: Once | INTRAMUSCULAR | Status: AC
  Administered 2020-01-16: 4 mg via INTRAVENOUS
  Filled 2020-01-16: qty 2

## 2020-01-16 MED ORDER — SODIUM CHLORIDE 0.9 % IV SOLN
200.0000 mg | Freq: Once | INTRAVENOUS | Status: AC
  Administered 2020-01-16: 200 mg via INTRAVENOUS
  Filled 2020-01-16: qty 200

## 2020-01-16 MED ORDER — SODIUM CHLORIDE 0.9 % IV BOLUS: 500.0000 mL | Freq: Once | INTRAVENOUS | Status: DC

## 2020-01-16 MED ORDER — SODIUM CHLORIDE 0.9 % IV SOLN
8.0000 mg | INTRAVENOUS | Status: AC
  Administered 2020-01-16: 8 mg via INTRAVENOUS
  Filled 2020-01-16: qty 4

## 2020-01-16 MED ORDER — SODIUM CHLORIDE 0.9 % IV SOLN: INTRAVENOUS | Status: AC

## 2020-01-16 MED ORDER — TACROLIMUS 1 MG PO CAPS: 2.0000 mg | ORAL_CAPSULE | Freq: Two times a day (BID) | ORAL | Status: DC

## 2020-01-16 MED ORDER — PROMETHAZINE HCL 25 MG PO TABS
25.0000 mg | ORAL_TABLET | Freq: Four times a day (QID) | ORAL | Status: DC | PRN
  Administered 2020-01-16 – 2020-01-17 (×2): 25 mg via ORAL
  Filled 2020-01-16 (×2): qty 1

## 2020-01-16 MED ORDER — TACROLIMUS 1 MG PO CAPS
2.0000 mg | ORAL_CAPSULE | Freq: Every day | ORAL | Status: DC
  Administered 2020-01-16 – 2020-01-19 (×4): 2 mg via ORAL
  Filled 2020-01-16 (×4): qty 2

## 2020-01-16 MED ORDER — ASPIRIN EC 81 MG PO TBEC
81.0000 mg | DELAYED_RELEASE_TABLET | Freq: Every day | ORAL | Status: DC
  Administered 2020-01-16 – 2020-01-20 (×5): 81 mg via ORAL
  Filled 2020-01-16 (×5): qty 1

## 2020-01-16 MED ORDER — DEXAMETHASONE SODIUM PHOSPHATE 10 MG/ML IJ SOLN
6.0000 mg | INTRAMUSCULAR | Status: DC
  Administered 2020-01-16 – 2020-01-19 (×4): 6 mg via INTRAVENOUS
  Filled 2020-01-16: qty 0.6
  Filled 2020-01-16 (×3): qty 1

## 2020-01-16 MED ORDER — OXYCODONE HCL 5 MG PO TABS
15.0000 mg | ORAL_TABLET | Freq: Four times a day (QID) | ORAL | Status: DC
  Administered 2020-01-16 – 2020-01-17 (×2): 15 mg via ORAL
  Filled 2020-01-16 (×5): qty 3

## 2020-01-16 MED ORDER — METOCLOPRAMIDE HCL 10 MG PO TABS: 10.0000 mg | ORAL_TABLET | Freq: Four times a day (QID) | ORAL | Status: DC

## 2020-01-16 MED ORDER — METOCLOPRAMIDE HCL 5 MG/ML IJ SOLN
10.0000 mg | Freq: Four times a day (QID) | INTRAMUSCULAR | Status: DC
  Administered 2020-01-17 – 2020-01-20 (×14): 10 mg via INTRAVENOUS
  Filled 2020-01-16 (×14): qty 2

## 2020-01-16 MED ORDER — MYCOPHENOLATE SODIUM 180 MG PO TBEC
360.0000 mg | DELAYED_RELEASE_TABLET | Freq: Two times a day (BID) | ORAL | Status: DC
  Administered 2020-01-16 – 2020-01-20 (×7): 360 mg via ORAL
  Filled 2020-01-16 (×10): qty 2

## 2020-01-16 MED ORDER — METOCLOPRAMIDE HCL 5 MG/ML IJ SOLN
10.0000 mg | Freq: Two times a day (BID) | INTRAMUSCULAR | Status: DC
  Administered 2020-01-16: 10 mg via INTRAVENOUS
  Filled 2020-01-16: qty 2

## 2020-01-16 MED ORDER — LABETALOL HCL 200 MG PO TABS
300.0000 mg | ORAL_TABLET | Freq: Two times a day (BID) | ORAL | Status: DC
  Administered 2020-01-16 – 2020-01-20 (×8): 300 mg via ORAL
  Filled 2020-01-16 (×8): qty 1

## 2020-01-16 MED ORDER — SODIUM CHLORIDE 0.9 % IV BOLUS
1000.0000 mL | Freq: Once | INTRAVENOUS | Status: AC
  Administered 2020-01-16: 1000 mL via INTRAVENOUS

## 2020-01-16 MED ORDER — TACROLIMUS 1 MG PO CAPS
3.0000 mg | ORAL_CAPSULE | Freq: Every day | ORAL | Status: DC
  Administered 2020-01-17 – 2020-01-20 (×4): 3 mg via ORAL
  Filled 2020-01-16 (×4): qty 3

## 2020-01-16 MED ORDER — SODIUM CHLORIDE 0.9 % IV SOLN
8.0000 mg | Freq: Four times a day (QID) | INTRAVENOUS | Status: DC | PRN
  Filled 2020-01-16: qty 4

## 2020-01-16 MED ORDER — MORPHINE SULFATE (PF) 4 MG/ML IV SOLN
4.0000 mg | INTRAVENOUS | Status: DC | PRN
  Administered 2020-01-16: 4 mg via INTRAVENOUS
  Filled 2020-01-16: qty 1

## 2020-01-16 NOTE — Anesthesia Preprocedure Evaluation (Deleted)
Anesthesia Evaluation    Reviewed: Allergy & Precautions, Patient's Chart, lab work & pertinent test results  Airway        Dental   Pulmonary Current Smoker,           Cardiovascular hypertension, Pt. on home beta blockers and Pt. on medications      Neuro/Psych  Headaches, PSYCHIATRIC DISORDERS    GI/Hepatic Neg liver ROS, GERD  Medicated,Gastropathy Gastroparesis   Endo/Other  negative endocrine ROSdiabetes  Renal/GU ESRF and DialysisRenal disease     Musculoskeletal negative musculoskeletal ROS (+)   Abdominal   Peds  Hematology negative hematology ROS (+)   Anesthesia Other Findings coffee like emesis COVID +  Reproductive/Obstetrics                             Anesthesia Physical Anesthesia Plan  ASA: IV  Anesthesia Plan:    Post-op Pain Management:    Induction:   PONV Risk Score and Plan:   Airway Management Planned:   Additional Equipment:   Intra-op Plan:   Post-operative Plan:   Informed Consent:   Plan Discussed with:   Anesthesia Plan Comments:         Anesthesia Quick Evaluation

## 2020-01-16 NOTE — Plan of Care (Signed)
  Problem: Coping: Goal: Level of anxiety will decrease Outcome: Progressing   Problem: Pain Managment: Goal: General experience of comfort will improve Outcome: Progressing   

## 2020-01-16 NOTE — ED Triage Notes (Signed)
Pt presents with acute onset of vomiting blood this am. Hx of the same, states "it's been years" denies any abd/chest/back pain or SOB.

## 2020-01-16 NOTE — ED Notes (Signed)
ED TO INPATIENT HANDOFF REPORT  ED Nurse Name and Phone #: [phone]  S Name/Age/Gender [name] 44 y.o. male Room/Bed: 025C/025C  Code Status   Code Status: Prior  Home/SNF/Other Home Patient oriented to: self, place, time and situation Is this baseline? Yes   Triage Complete: Triage complete  Chief Complaint Acute kidney injury [facility]) [N17.9]  Triage Note Pt presents with acute onset of vomiting blood this am. Hx of the same, states "it's been years" denies any abd/chest/back pain or SOB.     Allergies Allergies  Allergen Reactions  . Lisinopril     Cough  . Amlodipine Rash    Level of Care/Admitting Diagnosis ED Disposition    ED Disposition Condition [facility] Area: [place] [[id number]]  Level of Care: Med-Surg [16]  I expect the patient will be discharged within 24 hours: No (not a candidate for 5C-Observation unit)  Covid Evaluation: Asymptomatic Screening Protocol (No Symptoms)  Diagnosis: Acute kidney injury [facility]) [[id number]]  Admitting Physician: Lady Deutscher [[id number]]  Attending Physician: Lady Deutscher [[id number]]       B Medical/Surgery History Past Medical History:  Diagnosis Date  . 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 Arthritis    "I think I do; just in my fingers & my hands"  . Blood transfusion   . Cataract   . Chronic pain   . Depression    Patient states he has never been depressed.  . Diabetes mellitus without complication ([place])    no since pancreas transplant  . Dialysis patient [facility]) 04/18/12   "[facility]; Tues, [place], [place]"  . Edema   . ESRD (end stage renal disease) on dialysis ([place])   . Gastroparesis   . Gastropathy   . GERD (gastroesophageal reflux disease)   . Headache(784.0)   . Hypertension    Past Surgical History:  Procedure Laterality Date  . AV FISTULA PLACEMENT  08/2011   left upper arm  . BELOW KNEE LEG AMPUTATION  "it's been awhile"   bilaterally  . CATARACT EXTRACTION  ~ 2011   right  . COMBINED KIDNEY-PANCREAS TRANSPLANT  2014  . ESOPHAGOGASTRODUODENOSCOPY N/A 12/30/2016   Procedure: ESOPHAGOGASTRODUODENOSCOPY (EGD);  Surgeon: Carol Ada, MD;  Location: [facility] ENDOSCOPY;  Service: Endoscopy;  Laterality: N/A;     A IV Location/Drains/Wounds Patient Lines/Drains/Airways Status   Active Line/Drains/Airways    Name:   Placement date:   Placement time:   Site:   Days:   Peripheral IV 01/16/20 Right Antecubital   01/16/20    0810    Antecubital   less than 1   Peripheral IV 01/16/20 Left;Anterior Forearm   01/16/20    0835    Forearm   less than 1   Pressure Injury 07/17/17 Stage II -  Partial thickness loss of dermis presenting as a shallow open ulcer with a red, pink wound bed without slough. 0.75 cm x 0.75 cm   07/17/17    2115     913   Wound / Incision (Open or Dehisced) 05/14/17 Other (Comment) Hand Left Skin red and swollen.   05/14/17    1252    Hand   977          Intake/Output Last 24 hours  Intake/Output Summary (Last 24 hours) at 01/16/2020 1451 Last data filed at 01/16/2020 1355 Gross per 24 hour  Intake 2000 ml  Output 150 ml  Net 1850 ml  Labs/Imaging Results for orders placed or performed during the hospital encounter of 01/16/20 (from the past 48 hour(s))  Comprehensive metabolic panel     Status: Abnormal   Collection Time: 01/16/20  7:07 AM  Result Value Ref Range   Sodium 139 135 - 145 mmol/L   Potassium 4.3 3.5 - 5.1 mmol/L   Chloride 100 98 - 111 mmol/L   CO2 24 22 - 32 mmol/L   Glucose, Bld 112 (H) 70 - 99 mg/dL   BUN 23 (H) 6 - 20 mg/dL   Creatinine, Ser 1.91 (H) 0.61 - 1.24 mg/dL   Calcium 9.4 8.9 - 10.3 mg/dL   Total Protein 7.7 6.5 - 8.1 g/dL   Albumin 3.9 3.5 - 5.0 g/dL   AST 22 15 - 41 U/L   ALT 22 0 - 44 U/L   Alkaline Phosphatase 80 38 - 126 U/L   Total Bilirubin 0.6 0.3 - 1.2 mg/dL   GFR calc non Af Amer 41 (L) >60 mL/min   GFR calc Af Amer 48 (L) >60 mL/min   Anion gap  15 5 - 15    Comment: Performed at [facility], [address], [place], [place] [address]  CBC WITH DIFFERENTIAL     Status: Abnormal   Collection Time: 01/16/20  7:07 AM  Result Value Ref Range   WBC 6.4 4.0 - 10.5 K/uL   RBC 5.95 (H) 4.22 - 5.81 MIL/uL   Hemoglobin 18.5 (H) 13.0 - 17.0 g/dL   HCT 57.4 (H) 39.0 - 52.0 %    Comment: REPEATED TO VERIFY   MCV 96.5 80.0 - 100.0 fL   MCH 31.1 26.0 - 34.0 pg   MCHC 32.2 30.0 - 36.0 g/dL   RDW 14.5 11.5 - 15.5 %   Platelets 220 150 - 400 K/uL   nRBC 0.0 0.0 - 0.2 %   Neutrophils Relative % 66 %   Neutro Abs 4.2 1.7 - 7.7 K/uL   Lymphocytes Relative 9 %   Lymphs Abs 0.6 (L) 0.7 - 4.0 K/uL   Monocytes Relative 23 %   Monocytes Absolute 1.5 (H) 0.1 - 1.0 K/uL   Eosinophils Relative 2 %   Eosinophils Absolute 0.2 0.0 - 0.5 K/uL   Basophils Relative 0 %   Basophils Absolute 0.0 0.0 - 0.1 K/uL   Immature Granulocytes 0 %   Abs Immature Granulocytes 0.02 0.00 - 0.07 K/uL    Comment: Performed at [facility], [place] [address], [place], [place] [address]  Protime-INR     Status: None   Collection Time: 01/16/20  7:07 AM  Result Value Ref Range   Prothrombin Time 12.5 11.4 - 15.2 seconds   INR 0.9 0.8 - 1.2    Comment: (NOTE) INR goal varies based on device and disease states. Performed at [facility], [place] [address], [place], [place] [address]   Type and screen [place]     Status: None   Collection Time: 01/16/20  8:05 AM  Result Value Ref Range   ABO/RH(D) O POS    Antibody Screen NEG    Sample Expiration      01/19/2020,2359 Performed at [facility], [place] [address] [place], [place] [address]    No results found.  Pending Labs Unresulted Labs (From admission, onward)    Start     Ordered   01/16/20 1157  SARS CORONAVIRUS 2 (TAT 6-24 HRS) Nasopharyngeal Nasopharyngeal Swab  (Tier 3 (TAT  6-24 hrs))  Once,   STAT    Question Answer Comment  Is this test for diagnosis or screening  Screening   Symptomatic for COVID-19 as defined by CDC No   Hospitalized for COVID-19 No   Admitted to ICU for COVID-19 No   Previously tested for COVID-19 Yes   Resident in a congregate (group) care setting No   Employed in healthcare setting No      01/16/20 1156          Vitals/Pain Today's Vitals   01/16/20 0703 01/16/20 0730  BP: (!) 167/108   Pulse: 94   Resp: 16   Temp: 98.8 F (37.1 C)   SpO2: 97%   PainSc:  0-No pain    Isolation Precautions No active isolations  Medications Medications  morphine 4 MG/ML injection 4 mg (4 mg Intravenous Given 01/16/20 1348)  pantoprazole (PROTONIX) injection 40 mg (has no administration in time range)  metoCLOPramide (REGLAN) injection 10 mg (has no administration in time range)  ondansetron (ZOFRAN) injection 4 mg (4 mg Intravenous Given 01/16/20 0829)  pantoprazole (PROTONIX) injection 40 mg (40 mg Intravenous Given 01/16/20 0829)  sodium chloride 0.9 % bolus 1,000 mL (0 mLs Intravenous Stopped 01/16/20 0930)  lactated ringers bolus 1,000 mL (0 mLs Intravenous Stopped 01/16/20 1031)  promethazine (PHENERGAN) injection 25 mg (25 mg Intravenous Given 01/16/20 1106)  ondansetron (ZOFRAN) 8 mg in sodium chloride 0.9 % 50 mL IVPB (8 mg Intravenous New Bag/Given 01/16/20 1353)    Mobility walks Low fall risk   Focused Assessments Renal Assessment Handoff:  Hemodialysis Schedule:  Last Hemodialysis date and time:    Restricted appendage:   R Recommendations: See Admitting Provider Note  Report given to:   Additional Notes:

## 2020-01-16 NOTE — ED Notes (Signed)
Pt resting. Given new emesis bag.

## 2020-01-16 NOTE — Progress Notes (Signed)
Patient was positive for COVID-19. MD notified. Transfer orders to be placed by MD. Bed request made.

## 2020-01-16 NOTE — ED Provider Notes (Signed)
[facility] EMERGENCY DEPARTMENT Provider Note   CSN: [id number] Arrival date & time: 01/16/20  0093     History Chief Complaint  Patient presents with  . Emesis    [name] is a 44 y.o. male.  HPI 44 year old male presents with vomiting blood. Patient states he has been vomiting for the past 2 days. Initially there was not blood. However since yesterday he has had numerous episodes and now it's all blood. No clots. No abdominal pain or diarrhea. No blood in his stool. He does feel dizzy. No chest pain. He is not on any blood thinners, NSAIDs, or aspirin. He has had this before.   Past Medical History:  Diagnosis Date  . 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 Arthritis    "I think I do; just in my fingers & my hands"  . Blood transfusion   . Cataract   . Chronic pain   . Depression    Patient states he has never been depressed.  . Diabetes mellitus without complication ([place])    no since pancreas transplant  . Dialysis patient [facility]) 04/18/12   "[facility]; Tues, [place], [place]"  . Edema   . ESRD (end stage renal disease) on dialysis ([place])   . Gastroparesis   . Gastropathy   . GERD (gastroesophageal reflux disease)   . Headache(784.0)   . Hypertension     Patient Active Problem List   Diagnosis Date Noted  . Acute kidney injury superimposed on CKD ([place]) 01/16/2020  . Dehydration 01/16/2020  . Viral gastroenteritis 01/16/2020  . Acute kidney injury ([place]) 01/16/2020  . Olecranon bursitis of left elbow 09/16/2018  . CKD (chronic kidney disease), stage III 10/26/2017  . Abnormal EKG 10/26/2017  . Hypertension 08/22/2017  . Acute GI bleeding 07/17/2017  . Hypertensive urgency 12/29/2016  . Hematemesis   . Healthcare maintenance 08/26/2016  . Back pain 12/15/2015  . History of diabetes mellitus 02/20/2015  . Abdominal pain 09/10/2014  . Weakness of left upper extremity 07/03/2014  . Impaired mobility and activities of  daily living 07/03/2014  . Sternoclavicular joint pain 03/04/2014  . Renal transplant, status post 07/19/2013  . History of pancreas transplant ([place]) 07/19/2013  . Immunosuppressed status ([place]) 06/27/2013  . H/O pancreas transplant ([place]) 06/27/2013  . Diabetic gastroparesis associated with type 1 diabetes mellitus ([place]) 05/08/2013  . BKA stump complication ([place]) 81/82/9937  . S/P bilateral BKA (below knee amputation) ([place]) 08/17/2012  . Metabolic bone disease 16/96/7893  . ESRD (end stage renal disease) ([place]) 11/17/2009  . GERD 03/14/2007  . HLD (hyperlipidemia) 02/22/2007  . Major depressive disorder, recurrent episode ([place]) 02/22/2007  . POST TRAUMATIC STRESS DISORDER 02/22/2007  . 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 AV FISTULA PLACEMENT  08/2011   left upper arm  . BELOW KNEE LEG AMPUTATION  "it's been awhile"   bilaterally  . CATARACT EXTRACTION  ~ 2011   right  . COMBINED KIDNEY-PANCREAS TRANSPLANT  2014  . ESOPHAGOGASTRODUODENOSCOPY N/A 12/30/2016   Procedure: ESOPHAGOGASTRODUODENOSCOPY (EGD);  Surgeon: Carol Ada, MD;  Location: [facility] ENDOSCOPY;  Service: Endoscopy;  Laterality: N/A;       Family History  Problem Relation Age of Onset  . Diabetes Other   . Hypertension Other   . Hypertension Mother   . Diabetes Mother   . Kidney disease Mother   . Diabetes Maternal Grandmother   . Diabetes Paternal Grandmother   .  Colon cancer Neg Hx   . Esophageal cancer Neg Hx   . Rectal cancer Neg Hx   . Stomach cancer Neg Hx     Social History   Tobacco Use  . Smoking status: Current Some Day Smoker  . Smokeless tobacco: Never Used  Substance Use Topics  . Alcohol use: No  . Drug use: Yes    Frequency: 3.0 times per week    Types: Marijuana    Comment: Occasionally    Home Medications Prior to Admission medications   Medication Sig Start Date End Date Taking? Authorizing Provider  aspirin EC 81 MG tablet Take 81  mg by mouth daily.   Yes [provider]  hydrALAZINE (APRESOLINE) 50 MG tablet Take 1 tablet (50 mg total) by mouth 3 (three) times daily. Patient taking differently: Take 75 mg by mouth 2 (two) times daily.  10/28/17  Yes Kayleen Memos, DO  labetalol (NORMODYNE) 100 MG tablet Take 1 tablet (100 mg total) by mouth 2 (two) times daily. 10/28/17  Yes Kayleen Memos, DO  labetalol (NORMODYNE) 200 MG tablet TK 1 T PO BID 08/06/18  Yes [provider]  metoCLOPramide (REGLAN) 10 MG tablet Take 1 tablet (10 mg total) by mouth 4 (four) times daily. Take 1tab 20-30 minutes before breakfast, lunch, dinner and at bedtime 03/01/17  Yes Levin Erp, [place]  mycophenolate (MYFORTIC) 180 MG EC tablet Take 360 mg by mouth 2 (two) times daily.  07/06/13  Yes [provider]  omeprazole (PRILOSEC) 40 MG capsule TAKE 1 CAPSULE BY MOUTH DAILY 30 TO 60 MINUTES BEFORE BREAKFAST 10/29/18  Yes Gatha Mayer, MD  ondansetron (ZOFRAN) 4 MG tablet TK 1 T PO BID PRN 01/21/19  Yes [provider]  OxyCODONE HCl 15 MG TABA Take 15 mg by mouth 4 (four) times daily.   Yes [provider]  predniSONE (DELTASONE) 5 MG tablet Take 5 mg by mouth daily.  06/20/13  Yes [provider]  promethazine (PHENERGAN) 25 MG tablet Take 25 mg by mouth every 6 (six) hours as needed for nausea or vomiting.   Yes [provider]  saccharomyces boulardii (FLORASTOR) 250 MG capsule Take 250 mg by mouth 2 (two) times daily.   Yes [provider]  sulfamethoxazole-trimethoprim (BACTRIM,SEPTRA) 400-80 MG per tablet Take 1 tablet by mouth every Monday, Wednesday, and Friday.    Yes [provider]  tacrolimus (PROGRAF) 1 MG capsule Take 2-3 mg by mouth 2 (two) times daily. 3 mg in the morning, 2 mg in the evening 07/16/13  Yes [provider]  augmented betamethasone dipropionate (DIPROLENE-AF) 0.05 % ointment Apply 1 application topically 2 (two) times daily as  needed. 08/20/19   [provider]  ondansetron (ZOFRAN ODT) 4 MG disintegrating tablet Take 1 tablet (4 mg total) by mouth every 8 (eight) hours as needed for nausea or vomiting. Patient not taking: Reported on 01/16/2020 01/08/15   Gareth Morgan, MD    Allergies    Lisinopril and Amlodipine  Review of Systems   Review of Systems  Constitutional: Negative for fever.  Respiratory: Negative for cough and shortness of breath.   Cardiovascular: Negative for chest pain.  Gastrointestinal: Positive for nausea and vomiting. Negative for abdominal pain and diarrhea.  Neurological: Positive for light-headedness.  All other systems reviewed and are negative.   Physical Exam Updated Vital Signs BP (!) 167/108 (BP Location: Right Arm)   Pulse 94   Temp 98.8 F (37.1 C)  Resp 16   SpO2 97%   Physical Exam Vitals and nursing note reviewed.  Constitutional:      Appearance: He is well-developed.  HENT:     Head: Normocephalic and atraumatic.     Right Ear: External ear normal.     Left Ear: External ear normal.     Nose: Nose normal.     Mouth/Throat:     Mouth: Mucous membranes are dry.     Comments: No blood around mouth or in oropharynx Eyes:     General:        Right eye: No discharge.        Left eye: No discharge.  Cardiovascular:     Rate and Rhythm: Regular rhythm. Tachycardia present.     Heart sounds: Normal heart sounds.     Comments: HR high 90s, low 100s Pulmonary:     Effort: Pulmonary effort is normal.     Breath sounds: Normal breath sounds.  Abdominal:     General: There is no distension.     Palpations: Abdomen is soft.     Tenderness: There is no abdominal tenderness.  Musculoskeletal:     Cervical back: Neck supple.  Skin:    General: Skin is warm and dry.  Neurological:     Mental Status: He is alert.  Psychiatric:        Mood and Affect: Mood is not anxious.     ED Results / Procedures / Treatments   Labs (all labs ordered are  listed, but only abnormal results are displayed) Labs Reviewed  COMPREHENSIVE METABOLIC PANEL - Abnormal; Notable for the following components:      Result Value   Glucose, Bld 112 (*)    BUN 23 (*)    Creatinine, Ser 1.91 (*)    GFR calc non Af Amer 41 (*)    GFR calc Af Amer 48 (*)    All other components within normal limits  CBC WITH DIFFERENTIAL/PLATELET - Abnormal; Notable for the following components:   RBC 5.95 (*)    Hemoglobin 18.5 (*)    HCT 57.4 (*)    Lymphs Abs 0.6 (*)    Monocytes Absolute 1.5 (*)    All other components within normal limits  SARS CORONAVIRUS 2 (TAT 6-24 HRS)  PROTIME-INR  TYPE AND SCREEN    EKG EKG Interpretation  Date/Time:  Thursday January 16 2020 07:07:27 EST Ventricular Rate:  99 PR Interval:    QRS Duration: 81 QT Interval:  347 QTC Calculation: 446 R Axis:   -31 Text Interpretation: Sinus rhythm Right atrial enlargement Left axis deviation Anteroseptal infarct, old Baseline wander in lead(s) V1 V2 V3 V4 V5 V6 Poor data quality in current ECG precludes serial comparison Confirmed by Sherwood Gambler [phone]) on 01/16/2020 7:09:09 AM   Radiology No results found.  Procedures Ultrasound ED Peripheral IV (Provider)  Date/Time: 01/16/2020 8:06 AM Performed by: Sherwood Gambler, MD Authorized by: Sherwood Gambler, MD   Procedure details:    Indications: hydration, multiple failed IV attempts and poor IV access     Skin Prep: isopropyl alcohol     Location:  Right AC   Angiocath:  20 G   Bedside Ultrasound Guided: Yes     Images: archived     Patient tolerated procedure without complications: Yes     Dressing applied: Yes     (including critical care time)  Medications Ordered in ED Medications  morphine 4 MG/ML injection 4 mg (4 mg Intravenous Given  01/16/20 1348)  pantoprazole (PROTONIX) injection 40 mg (has no administration in time range)  metoCLOPramide (REGLAN) injection 10 mg (has no administration in time range)    ondansetron (ZOFRAN) injection 4 mg (4 mg Intravenous Given 01/16/20 0829)  pantoprazole (PROTONIX) injection 40 mg (40 mg Intravenous Given 01/16/20 0829)  sodium chloride 0.9 % bolus 1,000 mL (0 mLs Intravenous Stopped 01/16/20 0930)  lactated ringers bolus 1,000 mL (0 mLs Intravenous Stopped 01/16/20 1031)  promethazine (PHENERGAN) injection 25 mg (25 mg Intravenous Given 01/16/20 1106)  ondansetron (ZOFRAN) 8 mg in sodium chloride 0.9 % 50 mL IVPB (0 mg Intravenous Stopped 01/16/20 1453)    ED Course  I have reviewed the triage vital signs and the nursing notes.  Pertinent labs & imaging results that were available during my care of the patient were reviewed by me and considered in my medical decision making (see chart for details).    MDM Rules/Calculators/A&P                      Patient has continued vomiting despite Zofran and Phenergan.  Mild bump in his creatinine and with his kidney transplant I think it needs to be more carefully monitored and given fluids.  I did discuss with GI given his report of hematemesis though none here when he is vomiting.  Hospitalist will admit.  Abdominal exam is benign.  Has a history of multiple episodes of vomiting, I do not think emergent CT is needed as I have low suspicion for intra-abdominal emergency. Final Clinical Impression(s) / ED Diagnoses Final diagnoses:  Intractable nausea and vomiting    Rx / DC Orders ED Discharge Orders    None       Sherwood Gambler, MD 01/16/20 1552

## 2020-01-16 NOTE — H&P (Signed)
History and Physical    [name] TLX:[id number] DOB: [birth year] DOA: 01/16/2020  PCP: Lance Sell, NP  Patient coming from: Home  I have personally briefly reviewed patient's old medical records in [place]  Chief Complaint: Hematemesis which began yesterday  HPI: [name] is a 44 y.o. male with medical history significant of end-stage renal disease status post kidney transplant on immunosuppressants, diabetes mellitus type 1, hypertension, esophageal reflux disease, and diabetic gastroparesis presents the emergency department with episodes of hematemesis.  He states he was vomiting for 2 days initially there was no blood but then yesterday developed some hematemesis and since then has had numerous episodes.  Now his vomitus as well blood.  He has had no clots associated with it.  Had no diarrhea when I examined him he did have some abdominal pain.  He has no blood in his stools.  He does feel dizzy.  He has no chest pain.  Is not on any blood thinners, NSAIDs or aspirin.  He has had episodes like this in the past.  He does have a history of gastroparesis.  His cough, shortness of breath, fevers, chills, or other symptoms.  States abdominal pain is diffuse and associated with muscle discomfort from constant vomiting.  Does not radiate.  Nothing makes it better, vomiting makes it worse.  Describes pain as a gnawing-like sensation  ED Course: Distant vomiting despite antiemetics with laboratories consistent with acute kidney injury.  Patient referred to me for further evaluation and management.  After admission patient was noted to be Covid positive.  Agents had been taken prior to testing results however situation became clear that appears that patient has Covid related gastroenteritis with Covid related acute kidney injury.  Likely his hematemesis is due to Mallory-Weiss tear.  Review of Systems: As per HPI otherwise all other systems reviewed and  negative.    Past Medical History:  Diagnosis Date  . 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 Arthritis    "I think I do; just in my fingers & my hands"  . Blood transfusion   . Cataract   . Chronic pain   . Depression    Patient states he has never been depressed.  . Diabetes mellitus without complication ([place])    no since pancreas transplant  . Dialysis patient [facility]) 04/18/12   "[facility]; Tues, [place], [place]"  . Edema   . ESRD (end stage renal disease) on dialysis ([place])   . Gastroparesis   . Gastropathy   . GERD (gastroesophageal reflux disease)   . Headache(784.0)   . Hypertension     Past Surgical History:  Procedure Laterality Date  . AV FISTULA PLACEMENT  08/2011   left upper arm  . BELOW KNEE LEG AMPUTATION  "it's been awhile"   bilaterally  . CATARACT EXTRACTION  ~ 2011   right  . COMBINED KIDNEY-PANCREAS TRANSPLANT  2014  . ESOPHAGOGASTRODUODENOSCOPY N/A 12/30/2016   Procedure: ESOPHAGOGASTRODUODENOSCOPY (EGD);  Surgeon: Carol Ada, MD;  Location[facility] ENDOSCOPY;  Service: Endoscopy;  Laterality: N/A;    Social History   Social History Narrative   Fun: Restore old cars      reports that he has been smoking. He has never used smokeless tobacco. He reports current drug use. Frequency: 3.00 times per week. Drug: Marijuana. He reports that he does not drink alcohol.  Allergies  Allergen Reactions  . Lisinopril     Cough  . Amlodipine Rash  Family History  Problem Relation Age of Onset  . Diabetes Other   . Hypertension Other   . Hypertension Mother   . Diabetes Mother   . Kidney disease Mother   . Diabetes Maternal Grandmother   . Diabetes Paternal Grandmother   . Colon cancer Neg Hx   . Esophageal cancer Neg Hx   . Rectal cancer Neg Hx   . Stomach cancer Neg Hx      Prior to Admission medications   Medication Sig Start Date End Date Taking? Authorizing Provider  aspirin EC 81 MG tablet Take 81 mg by mouth daily.   Yes [provider]  hydrALAZINE (APRESOLINE) 50 MG tablet Take 1 tablet (50 mg total) by mouth 3 (three) times daily. Patient taking differently: Take 75 mg by mouth 2 (two) times daily.  10/28/17  Yes Kayleen Memos, DO  labetalol (NORMODYNE) 100 MG tablet Take 1 tablet (100 mg total) by mouth 2 (two) times daily. 10/28/17  Yes Kayleen Memos, DO  labetalol (NORMODYNE) 200 MG tablet TK 1 T PO BID 08/06/18  Yes [provider]  metoCLOPramide (REGLAN) 10 MG tablet Take 1 tablet (10 mg total) by mouth 4 (four) times daily. Take 1tab 20-30 minutes before breakfast, lunch, dinner and at bedtime 03/01/17  Yes Levin Erp, [place]  mycophenolate (MYFORTIC) 180 MG EC tablet Take 360 mg by mouth 2 (two) times daily.  07/06/13  Yes [provider]  omeprazole (PRILOSEC) 40 MG capsule TAKE 1 CAPSULE BY MOUTH DAILY 30 TO 60 MINUTES BEFORE BREAKFAST 10/29/18  Yes Gatha Mayer, MD  ondansetron (ZOFRAN) 4 MG tablet TK 1 T PO BID PRN 01/21/19  Yes [provider]  OxyCODONE HCl 15 MG TABA Take 15 mg by mouth 4 (four) times daily.   Yes [provider]  predniSONE (DELTASONE) 5 MG tablet Take 5 mg by mouth daily.  06/20/13  Yes [provider]  promethazine (PHENERGAN) 25 MG tablet Take 25 mg by mouth every 6 (six) hours as needed for nausea or vomiting.   Yes [provider]  saccharomyces boulardii (FLORASTOR) 250 MG capsule Take 250 mg by mouth 2 (two) times daily.   Yes [provider]  sulfamethoxazole-trimethoprim (BACTRIM,SEPTRA) 400-80 MG per tablet Take 1 tablet by mouth every Monday, Wednesday, and Friday.    Yes [provider]  tacrolimus (PROGRAF) 1 MG capsule Take 2-3 mg by mouth 2 (two) times daily. 3 mg in the morning, 2 mg in the evening 07/16/13  Yes [provider]  augmented betamethasone dipropionate (DIPROLENE-AF) 0.05 % ointment Apply 1 application topically 2 (two) times daily as needed. 08/20/19   [provider]  ondansetron (ZOFRAN ODT) 4 MG disintegrating tablet Take 1 tablet (4 mg total) by mouth every 8 (eight) hours as needed for nausea or vomiting. Patient not taking: Reported on 01/16/2020 01/08/15   Gareth Morgan, MD    Physical Exam:  Constitutional: NAD, calm, uncomfortable Vitals:   01/16/20 0703 01/16/20 1600  BP: (!) 167/108 (!) 201/108  Pulse: 94 96  Resp: 16 18  Temp: 98.8 F (37.1 C) 98.8 F (37.1 C)  TempSrc:  Oral  SpO2: 97% 100%   Eyes: PERRL, lids and conjunctivae normal ENMT: Mucous membranes are dry. Posterior pharynx clear of any exudate or lesions.Normal dentition.  Neck: normal, supple, no masses, no thyromegaly Respiratory: clear to auscultation bilaterally, no wheezing, no crackles. Normal respiratory effort. No accessory muscle use.  Cardiovascular: Regular rate  and rhythm, no murmurs / rubs / gallops. No extremity edema. 2+ pedal pulses. No carotid bruits.  Abdomen: Diffuse moderate abdominal tenderness, no masses palpated. No hepatosplenomegaly. Bowel sounds positive.  Musculoskeletal: no clubbing / cyanosis.  Bilateral BKA's noted. Good ROM, no contractures. Normal muscle tone.  Skin: no rashes, lesions, ulcers. No induration Neurologic: CN 2-12 grossly intact. Sensation intact, DTR normal. Strength 5/5 in all 4.  Psychiatric: Normal judgment and insight. Alert and oriented x 3. Normal mood.    Labs on Admission: I have personally reviewed following labs and imaging studies  CBC: Recent Labs  Lab 01/16/20 0707  WBC 6.4  NEUTROABS 4.2  HGB 18.5*  HCT 57.4*  MCV 96.5  PLT 782   Basic Metabolic Panel: Recent Labs  Lab 01/16/20 0707  NA 139  K 4.3  CL 100  CO2 24  GLUCOSE 112*  BUN 23*  CREATININE 1.91*  CALCIUM 9.4   Liver Function Tests: Recent Labs  Lab 01/16/20 0707  AST 22  ALT 22  ALKPHOS 80  BILITOT 0.6  PROT 7.7  ALBUMIN 3.9   Coagulation Profile: Recent Labs  Lab 01/16/20 0707  INR 0.9   Urine  analysis:    Component Value Date/Time   COLORURINE AMBER (A) 10/11/2016 2328   APPEARANCEUR CLEAR 10/11/2016 2328   LABSPEC 1.026 10/11/2016 2328   PHURINE 6.0 10/11/2016 2328   GLUCOSEU NEGATIVE 10/11/2016 2328   HGBUR NEGATIVE 10/11/2016 2328   HGBUR negative 10/08/2010 0853   BILIRUBINUR MODERATE (A) 10/11/2016 2328   BILIRUBINUR SMALL 12/14/2015 1139   KETONESUR NEGATIVE 10/11/2016 2328   PROTEINUR 30 (A) 10/11/2016 2328   UROBILINOGEN 1.0 12/14/2015 1139   UROBILINOGEN 0.2 09/13/2015 0051   NITRITE NEGATIVE 10/11/2016 2328   LEUKOCYTESUR NEGATIVE 10/11/2016 2328    Radiological Exams on Admission: No results found.  EKG: Independently reviewed.  Sinus rhythm Right atrial enlargement Left axis deviation Anteroseptal infarct, old Baseline wander in lead(s) V1 V2 V3 V4 V5 V6 EKG difficult to compare to others due to poor quality.  Assessment/Plan Principal Problem:   Acute kidney injury due to COVID-19 [facility]) Active Problems:   Hematemesis   Gastroenteritis due to COVID-19 virus   Abdominal pain   Diabetic gastroparesis associated with type 1 diabetes mellitus ([place])   Dehydration   S/P bilateral BKA (below knee amputation) ([place])   Renal transplant, status post   Immunosuppressed status ([place])   H/O pancreas transplant ([place])   1.  Acute kidney injury due to COVID-19: Patient will be admitted into the hospital and gently hydrated.  Monitor renal function.  Normal saline at 100 mL/h.  Will repeat BMP in a.m.  2.  Hematemesis: History is consistent with a Mallory-Weiss tear.  Hemoglobin is stable.  Repeat hemoglobin in a.m. give patient antiemetics.  GI has been consulted.  3.  Gastroenteritis due to COVID-19 virus: Noted patient quite ill with vomiting and nausea.  He also has some abdominal pain.  Has not had any diarrhea.  Will monitor closely.  Given his symptoms and the severity of them we will start him on remdesivir as well as dexamethasone to try to V8 some of  his symptomatology.  4.  Abdominal pain as above  5.  Dehydration: Related to nausea and vomiting and acute kidney injury.  Will hydrate as above.  6.  Diabetic gastroparesis associated with type 1 diabetes mellitus: Patient takes Reglan regularly will restart it IV and monitor closely  7.  Bilateral BKA's: Noted  8.  Status post renal transplant: Continue immunosuppressants to prevent transplant rejection.  9.  Status post pancreas transplant: As above  DVT prophylaxis: Lovenox Code Status: Full code Family Communication: Patient did not request Disposition Plan: Likely home in 24 to 48 hours Consults called: GI, Admission status: Observation It is my clinical opinion that referral for OBSERVATION is reasonable and necessary in this patient based on the above information provided. The aforementioned taken together are felt to place the patient at high risk for further clinical deterioration. However it is anticipated that the patient may be medically stable for discharge from the hospital within 24 to 48 hours.   Lady Deutscher MD FACP [facility] Pager [phone]  How to contact the [facility] Attending or Consulting provider [place] or covering provider during after hours [place], for this patient?  1. Check the care team in [facility] and look for a) attending/consulting TRH provider listed and b) the [facility] team listed 2. Log into [email or web address] and use [place]'s universal password to access. If you do not have the password, please contact the hospital operator. 3. Locate the [facility] provider you are looking for under [facility] and page to a number that you can be directly reached. 4. If you still have difficulty reaching the provider, please page the [facility] (Director on Call) for the Hospitalists listed on amion for assistance.  If 7PM-7AM, please contact night-coverage [email or web address] Password [facility]  01/16/2020, 7:16 PM

## 2020-01-16 NOTE — Progress Notes (Signed)
Report called to [facility]. Transferred pt. To 5W room 02.

## 2020-01-16 NOTE — Progress Notes (Signed)
Pt is for EGD tomorrow. Covid test came back positive. Called GI on call provider to made aware. MD Mansouraty called back to continue with regimen. Pt. on NPO post midnight. They will assess the pt. in the morning and go from there. No other issue at this time

## 2020-01-16 NOTE — Consult Note (Addendum)
[facility] Consult: 1:40 PM 01/16/2020  LOS: 0 days    Referring Provider: Dr Regenia Skeeter  Primary Care Physician:  Lance Sell, NP Primary Gastroenterologist:  Dr Carlean Purl Nephrologist:   Vanetta Mulders md    Reason for Consultation:  coffee ground emesis.  Mid abdominal pain.   HPI: [name] is a 44 y.o. male.  PMH end-stage renal disease.  Type 1 DM.  S/p renal and pancreas transplant at [facility] 2014 w resolution of DM and ESRD.  Antirejection drugs include Prograf, mycophenolate, low-dose prednisone.  Bilateral BKA.  GERD.  Hypertension.  Chronic pain. Bil BKA.  Gastroparesis.  Polycythemia.  Sigmoid diverticulosis noted on CTAP in 10/20   08/2012 gastric emptying study revealed significant delay in emptying suggesting gastroparesis.  99% retention at 60 minutes and 70% retention at 2 hours. 12/30/2016 EGD for hematemesis. Hemorrhagic esophagitis.  Otherwise normal study 02/2017 EGD For dysphagia.  No findings to explain patient's dysphagia complaint.  Dr. Arelia Longest proceeded with empiric dilatation of entire esophagus.  Medium amount of food residue in gastric body.  Otherwise normal study. Hospitalizations in 06/2017 with recurrent hematemesis, Dr. Rachael Darby did not pursue EGD.  Was on regimen of Reglan bid and omeprazole. Limited coffee ground emesis in 10/2017, no endoscopy pursued.  Lately pt compliant w bid Reglan but uses Omeprazole 40 mg prn, has taken it daily for the last 8 days.   Early yesterday he developed nausea, vomiting.   Initial emesis was clear, foamy but subsequently became coffee like.  He had innumerable episodes of vomiting and eventually developed central abdominal pain.  + Diaphoresis. Came to the ED for help.  BP hypertensive at  167/108, HR 94.   Has received Zofran, Phenergan, morphine, Protonix 40 mg IV.  He was given some ginger ale to sip and threw this up, the emesis was not coffee-like or bloody. Hgb is 18.5.  Platelets 220.  BUN/creatinine 23/1.9.  LFTs normal.  Patient not using NSAIDs.   Past Medical History:  Diagnosis Date  . 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 Arthritis    "I think I do; just in my fingers & my hands"  . Blood transfusion   . Cataract   . Chronic pain   . Depression    Patient states he has never been depressed.  . Diabetes mellitus without complication ([place])    no since pancreas transplant  . Dialysis patient [facility]) 04/18/12   "[facility]; T[place], [place], [place]"  . Edema   . ESRD (end stage renal disease) on dialysis ([place])   . Gastroparesis   . Gastropathy   . GERD (gastroesophageal reflux disease)   . Headache(784.0)   . Hypertension     Past Surgical History:  Procedure Laterality Date  . AV FISTULA PLACEMENT  08/2011   left upper arm  . BELOW KNEE LEG AMPUTATION  "it's been awhile"   bilaterally  . CATARACT EXTRACTION  ~ 2011   right  . COMBINED KIDNEY-PANCREAS TRANSPLANT    . ESOPHAGOGASTRODUODENOSCOPY  N/A 12/30/2016   Procedure: ESOPHAGOGASTRODUODENOSCOPY (EGD);  Surgeon: Carol Ada, MD;  Location: [facility] ENDOSCOPY;  Service: Endoscopy;  Laterality: N/A;    Prior to Admission medications   Medication Sig Start Date End Date Taking? Authorizing Provider  aspirin EC 81 MG tablet Take 81 mg by mouth daily.   Yes [provider]  hydrALAZINE (APRESOLINE) 50 MG tablet Take 1 tablet (50 mg total) by mouth 3 (three) times daily. Patient taking differently: Take 75 mg by mouth 2 (two) times daily.  10/28/17  Yes Kayleen Memos, DO  labetalol (NORMODYNE) 100 MG tablet Take 1 tablet (100 mg total) by mouth 2 (two) times daily. 10/28/17  Yes Kayleen Memos, DO  labetalol (NORMODYNE) 200 MG tablet TK 1 T PO BID 08/06/18  Yes [provider]    metoCLOPramide (REGLAN) 10 MG tablet Take 1 tablet (10 mg total) by mouth 4 (four) times daily. Take 1tab 20-30 minutes before breakfast, lunch, dinner and at bedtime 03/01/17  Yes Levin Erp, [place]  mycophenolate (MYFORTIC) 180 MG EC tablet Take 360 mg by mouth 2 (two) times daily.  07/06/13  Yes [provider]  omeprazole (PRILOSEC) 40 MG capsule TAKE 1 CAPSULE BY MOUTH DAILY 30 TO 60 MINUTES BEFORE BREAKFAST 10/29/18  Yes Gatha Mayer, MD  ondansetron (ZOFRAN) 4 MG tablet TK 1 T PO BID PRN 01/21/19  Yes [provider]  OxyCODONE HCl 15 MG TABA Take 15 mg by mouth 4 (four) times daily.   Yes [provider]  predniSONE (DELTASONE) 5 MG tablet Take 5 mg by mouth daily.  06/20/13  Yes [provider]  promethazine (PHENERGAN) 25 MG tablet Take 25 mg by mouth every 6 (six) hours as needed for nausea or vomiting.   Yes [provider]  saccharomyces boulardii (FLORASTOR) 250 MG capsule Take 250 mg by mouth 2 (two) times daily.   Yes [provider]  sulfamethoxazole-trimethoprim (BACTRIM,SEPTRA) 400-80 MG per tablet Take 1 tablet by mouth every Monday, Wednesday, and Friday.    Yes [provider]  tacrolimus (PROGRAF) 1 MG capsule Take 2-3 mg by mouth 2 (two) times daily. 3 mg in the morning, 2 mg in the evening 07/16/13  Yes [provider]  augmented betamethasone dipropionate (DIPROLENE-AF) 0.05 % ointment Apply 1 application topically 2 (two) times daily as needed. 08/20/19   [provider]  ondansetron (ZOFRAN ODT) 4 MG disintegrating tablet Take 1 tablet (4 mg total) by mouth every 8 (eight) hours as needed for nausea or vomiting. Patient not taking: Reported on 01/16/2020 01/08/15   Gareth Morgan, MD    Scheduled Meds:  Infusions: . ondansetron [facility]) IV     PRN Meds: morphine injection   Allergies as of 01/16/2020 - Review Complete 01/16/2020  Allergen Reaction Noted  . Lisinopril  02/17/2017   . Amlodipine Rash 02/17/2017    Family History  Problem Relation Age of Onset  . Diabetes Other   . Hypertension Other   . Hypertension Mother   . Diabetes Mother   . Kidney disease Mother   . Diabetes Maternal Grandmother   . Diabetes Paternal Grandmother   . Colon cancer Neg Hx   . Esophageal cancer Neg Hx   . Rectal cancer Neg Hx   . Stomach cancer Neg Hx     Social History   Socioeconomic History  . Marital status: Married    Spouse name: Not on file  . Number of children: 1  . Years  of education: 57  . Highest education level: Not on file  Occupational History  . Occupation: Disability  Tobacco Use  . Smoking status: Current Some Day Smoker  . Smokeless tobacco: Never Used  Substance and Sexual Activity  . Alcohol use: No  . Drug use: Yes    Frequency: 3.0 times per week    Types: Marijuana    Comment: Occasionally  . Sexual activity: Not Currently  Other Topics Concern  . Not on file  Social History Narrative   Fun: Restore old cars    Social Determinants of Health   Financial Resource Strain:   . Difficulty of Paying Living Expenses: Not on file  Food Insecurity:   . Worried About Charity fundraiser in the Last Year: Not on file  . Ran Out of Food in the Last Year: Not on file  Transportation Needs:   . Lack of Transportation (Medical): Not on file  . Lack of Transportation (Non-Medical): Not on file  Physical Activity:   . Days of Exercise per Week: Not on file  . Minutes of Exercise per Session: Not on file  Stress:   . Feeling of Stress : Not on file  Social Connections:   . Frequency of Communication with Friends and Family: Not on file  . Frequency of Social Gatherings with Friends and Family: Not on file  . Attends Religious Services: Not on file  . Active Member of Clubs or Organizations: Not on file  . Attends Archivist Meetings: Not on file  . Marital Status: Not on file  Intimate Partner Violence:   . Fear of Current or  Ex-Partner: Not on file  . Emotionally Abused: Not on file  . Physically Abused: Not on file  . Sexually Abused: Not on file    REVIEW OF SYSTEMS: Constitutional: Feels  ENT:  No nose bleeds Pulm: No shortness of breath.  No cough. CV:  No palpitations, no LE edema.   No angina. GU:  No hematuria, no frequency GI: See HPI. Heme: Denies excessive bleeding or bruising. Transfusions: Blood transfusion remotely. Neuro:  No headaches, no peripheral tingling or numbness Derm:  No itching, no rash or sores.  Endocrine:  No sweats or chills.  No polyuria or dysuria Immunization: Reviewed. Travel:  None beyond local counties in last few months.    PHYSICAL EXAM: Vital signs in last 24 hours: Vitals:   01/16/20 0703  BP: (!) 167/108  Pulse: 94  Resp: 16  Temp: 98.8 F (37.1 C)  SpO2: 97%   Wt Readings from Last 3 Encounters:  06/19/19 91.2 kg  09/13/18 91.2 kg  09/12/18 91.7 kg    General: Patient laying flat on right side on stretcher.  Does not have a lot to say but is able to answer questions accurately.  Not uncomfortable but appears ill.  Diaphoretic.  His hospital gown is damp. Head: No facial asymmetry or swelling.  No signs of head trauma. Eyes: No scleral icterus.  No conjunctival pallor. Ears: Not hard of hearing Nose: Sounds congested, he says it is because he was crying earlier. Mouth: No blood in the mouth.  Tongue midline.  Mucosa moist, pink, clear. Neck: No JVD, no masses. Lungs: Clear bilaterally.  No labored breathing.  No cough Heart: RRR.  No MRG.  S1, S2 present Abdomen: Soft.  Mild tenderness in the mid abdomen.  No masses, HSM, bruits, hernias..   Rectal: Deferred. Musc/Skeltl: No joint redness or swelling.  Status post  bilateral BKA's. Extremities: No peripheral edema. Neurologic: Oriented x3.  Laconic.  Moves all 4 limbs.  No tremors. Skin: No rashes, no sores. Nodes: No cervical adenopathy. Psych: cooperative, calm, pleasant, flat affect  consistent with his feeling poorly.  Intake/Output from previous day: No intake/output data recorded. Intake/Output this shift: Total I/O In: 2000 [IV Piggyback:2000] Out: -   LAB RESULTS: Recent Labs    01/16/20 0707  WBC 6.4  HGB 18.5*  HCT 57.4*  PLT 220   BMET Lab Results  Component Value Date   NA 139 01/16/2020   NA 140 10/28/2017   NA 142 10/26/2017   K 4.3 01/16/2020   K 3.6 10/28/2017   K 3.9 10/26/2017   CL 100 01/16/2020   CL 109 10/28/2017   CL 110 10/26/2017   CO2 24 01/16/2020   CO2 20 (L) 10/28/2017   CO2 20 (L) 10/26/2017   GLUCOSE 112 (H) 01/16/2020   GLUCOSE 82 10/28/2017   GLUCOSE 97 10/26/2017   BUN 23 (H) 01/16/2020   BUN 12 10/28/2017   BUN 14 10/26/2017   CREATININE 1.91 (H) 01/16/2020   CREATININE 1.50 (H) 10/28/2017   CREATININE 1.50 (H) 10/26/2017   CALCIUM 9.4 01/16/2020   CALCIUM 8.5 (L) 10/28/2017   CALCIUM 8.9 10/26/2017   LFT Recent Labs    01/16/20 0707  PROT 7.7  ALBUMIN 3.9  AST 22  ALT 22  ALKPHOS 80  BILITOT 0.6   PT/INR Lab Results  Component Value Date   INR 0.9 01/16/2020   INR 1.00 10/25/2017   INR 1.02 12/30/2016   Hepatitis Panel No results for input(s): HEPBSAG, HCVAB, HEPAIGM, HEPBIGM in the last 72 hours. C-Diff No components found for: CDIFF Lipase     Component Value Date/Time   LIPASE 58 (H) 10/25/2017 2009    Drugs of Abuse     Component Value Date/Time   LABOPIA NONE DETECTED 10/26/2017 0443   COCAINSCRNUR NONE DETECTED 10/26/2017 0443   COCAINSCRNUR NEG 01/21/2011 2024   LABBENZ NONE DETECTED 10/26/2017 0443   LABBENZ NEG 01/21/2011 2024   AMPHETMU NONE DETECTED 10/26/2017 0443   THCU POSITIVE (A) 10/26/2017 0443   LABBARB NONE DETECTED 10/26/2017 0443     RADIOLOGY STUDIES: No results found.   IMPRESSION:   *   Coffee like emesis in patient with hx  of same.  Hemorrhagic esophagitis on EGD 2018.  Gastroparesis confirmed by gastric emptying study 2013.  *   S/p renal and  pancreatic transplant 2014, compliant with anti-rejection meds.  Mycophenolate, prednisone 5 mg/day, Prograf  *   Polycythemia.  Suspect his next Hb reading will be lower following IV fluid.  *   covid 19 test pndg.     PLAN:     *   ? EGD vs continue tx w empiric PPI  *   Reglan 10 IV bid.    *  Ice chips ok but no sips yet.    *   Pt needs to be taking his omeprazole 40 on a daily basis, I discussed this with him.    Azucena Freed  01/16/2020, 1:40 PM Phone [phone]

## 2020-01-17 ENCOUNTER — Encounter (HOSPITAL_COMMUNITY)
Admission: EM | Disposition: A | Discharge status: Home / Self Care | Payer: Self-pay | Source: Home / Self Care | Attending: Internal Medicine

## 2020-01-17 DIAGNOSIS — E1043 Type 1 diabetes mellitus with diabetic autonomic (poly)neuropathy: Secondary | ICD-10-CM | POA: Diagnosis not present

## 2020-01-17 DIAGNOSIS — Z89511 Acquired absence of right leg below knee: Secondary | ICD-10-CM | POA: Diagnosis not present

## 2020-01-17 DIAGNOSIS — K3184 Gastroparesis: Secondary | ICD-10-CM

## 2020-01-17 DIAGNOSIS — Z9841 Cataract extraction status, right eye: Secondary | ICD-10-CM | POA: Diagnosis not present

## 2020-01-17 DIAGNOSIS — K922 Gastrointestinal hemorrhage, unspecified: Secondary | ICD-10-CM | POA: Diagnosis present

## 2020-01-17 DIAGNOSIS — Z9483 Pancreas transplant status: Secondary | ICD-10-CM | POA: Diagnosis not present

## 2020-01-17 DIAGNOSIS — K2101 Gastro-esophageal reflux disease with esophagitis, with bleeding: Secondary | ICD-10-CM | POA: Diagnosis present

## 2020-01-17 DIAGNOSIS — E860 Dehydration: Secondary | ICD-10-CM | POA: Diagnosis present

## 2020-01-17 DIAGNOSIS — G8929 Other chronic pain: Secondary | ICD-10-CM | POA: Diagnosis present

## 2020-01-17 DIAGNOSIS — K920 Hematemesis: Secondary | ICD-10-CM | POA: Diagnosis present

## 2020-01-17 DIAGNOSIS — U071 COVID-19: Secondary | ICD-10-CM | POA: Diagnosis present

## 2020-01-17 DIAGNOSIS — I10 Essential (primary) hypertension: Secondary | ICD-10-CM | POA: Diagnosis present

## 2020-01-17 DIAGNOSIS — Z8719 Personal history of other diseases of the digestive system: Secondary | ICD-10-CM | POA: Diagnosis not present

## 2020-01-17 DIAGNOSIS — D8481 Immunodeficiency due to conditions classified elsewhere: Secondary | ICD-10-CM | POA: Diagnosis present

## 2020-01-17 DIAGNOSIS — Y830 Surgical operation with transplant of whole organ as the cause of abnormal reaction of the patient, or of later complication, without mention of misadventure at the time of the procedure: Secondary | ICD-10-CM | POA: Diagnosis present

## 2020-01-17 DIAGNOSIS — Z841 Family history of disorders of kidney and ureter: Secondary | ICD-10-CM | POA: Diagnosis not present

## 2020-01-17 DIAGNOSIS — Z7952 Long term (current) use of systemic steroids: Secondary | ICD-10-CM | POA: Diagnosis not present

## 2020-01-17 DIAGNOSIS — A0839 Other viral enteritis: Secondary | ICD-10-CM | POA: Diagnosis present

## 2020-01-17 DIAGNOSIS — N179 Acute kidney failure, unspecified: Secondary | ICD-10-CM | POA: Diagnosis present

## 2020-01-17 DIAGNOSIS — Z8249 Family history of ischemic heart disease and other diseases of the circulatory system: Secondary | ICD-10-CM | POA: Diagnosis not present

## 2020-01-17 DIAGNOSIS — D751 Secondary polycythemia: Secondary | ICD-10-CM | POA: Diagnosis present

## 2020-01-17 DIAGNOSIS — Z89512 Acquired absence of left leg below knee: Secondary | ICD-10-CM | POA: Diagnosis not present

## 2020-01-17 DIAGNOSIS — K2971 Gastritis, unspecified, with bleeding: Secondary | ICD-10-CM | POA: Diagnosis present

## 2020-01-17 DIAGNOSIS — K226 Gastro-esophageal laceration-hemorrhage syndrome: Secondary | ICD-10-CM | POA: Diagnosis present

## 2020-01-17 DIAGNOSIS — Z792 Long term (current) use of antibiotics: Secondary | ICD-10-CM | POA: Diagnosis not present

## 2020-01-17 DIAGNOSIS — Z833 Family history of diabetes mellitus: Secondary | ICD-10-CM | POA: Diagnosis not present

## 2020-01-17 DIAGNOSIS — F17200 Nicotine dependence, unspecified, uncomplicated: Secondary | ICD-10-CM | POA: Diagnosis present

## 2020-01-17 DIAGNOSIS — T8619 Other complication of kidney transplant: Secondary | ICD-10-CM | POA: Diagnosis present

## 2020-01-17 LAB — PHOSPHORUS: Phosphorus: 3.8 mg/dL (ref 2.5–4.6)

## 2020-01-17 LAB — CBC WITH DIFFERENTIAL/PLATELET
Abs Immature Granulocytes: 0.01 10*3/uL (ref 0.00–0.07)
Basophils Absolute: 0 10*3/uL (ref 0.0–0.1)
Basophils Relative: 0 %
Eosinophils Absolute: 0 10*3/uL (ref 0.0–0.5)
Eosinophils Relative: 0 %
HCT: 50.6 % (ref 39.0–52.0)
Hemoglobin: 16.3 g/dL (ref 13.0–17.0)
Immature Granulocytes: 0 %
Lymphocytes Relative: 9 %
Lymphs Abs: 0.6 10*3/uL — ABNORMAL LOW (ref 0.7–4.0)
MCH: 30.8 pg (ref 26.0–34.0)
MCHC: 32.2 g/dL (ref 30.0–36.0)
MCV: 95.5 fL (ref 80.0–100.0)
Monocytes Absolute: 0.6 10*3/uL (ref 0.1–1.0)
Monocytes Relative: 8 %
Neutro Abs: 5.8 10*3/uL (ref 1.7–7.7)
Neutrophils Relative %: 83 %
Platelets: 182 10*3/uL (ref 150–400)
RBC: 5.3 MIL/uL (ref 4.22–5.81)
RDW: 14.5 % (ref 11.5–15.5)
WBC: 7 10*3/uL (ref 4.0–10.5)
nRBC: 0 % (ref 0.0–0.2)

## 2020-01-17 LAB — MAGNESIUM: Magnesium: 1.8 mg/dL (ref 1.7–2.4)

## 2020-01-17 LAB — FERRITIN: Ferritin: 50 ng/mL (ref 24–336)

## 2020-01-17 LAB — C-REACTIVE PROTEIN: CRP: 1.6 mg/dL — ABNORMAL HIGH (ref <1.0)

## 2020-01-17 LAB — D-DIMER, QUANTITATIVE: D-Dimer, Quant: 1.03 ug/mL-FEU — ABNORMAL HIGH (ref 0.00–0.50)

## 2020-01-17 SURGERY — CANCELLED PROCEDURE

## 2020-01-17 MED ORDER — IPRATROPIUM-ALBUTEROL 20-100 MCG/ACT IN AERS
1.0000 | INHALATION_SPRAY | Freq: Four times a day (QID) | RESPIRATORY_TRACT | Status: DC
  Administered 2020-01-17 (×2): 1 via RESPIRATORY_TRACT
  Filled 2020-01-17: qty 4

## 2020-01-17 MED ORDER — HYDRALAZINE HCL 20 MG/ML IJ SOLN
10.0000 mg | INTRAMUSCULAR | Status: DC | PRN
  Administered 2020-01-18: 07:00:00 10 mg via INTRAVENOUS
  Filled 2020-01-17 (×2): qty 1

## 2020-01-17 SURGICAL SUPPLY — 14 items

## 2020-01-17 NOTE — Progress Notes (Signed)
Patient admitted for AKI due to Covid-19. Arrived into the unit at 2250.  Patient is alert and oriented, bilateral BKA, vitals signs stable, skin intact. No respiratory distress noted.  Patient was oriented into the room and equipment. Pt will be  NPO through the night for EGD. Will continue to monitor

## 2020-01-17 NOTE — Progress Notes (Addendum)
PROGRESS NOTE    [name]  GUR:[id number] DOB: 11-26-1976 DOA: 01/16/2020 PCP: Lance Sell, NP   Brief Narrative:  44 year old with a history of ESRD status post renal transplant on immunosuppressant, DM 1, HTN, GERD, diabetic gastroparesis came to the hospital with complaints of hematemesis.  Found to be COVID-19 positive and mild renal insufficiency.  Seen by gastroenterology who recommended endoscopic evaluation.He was started on remdesivir and steroids.   Assessment & Plan:   Principal Problem:   Acute kidney injury due to COVID-19 [facility]) Active Problems:   S/P bilateral BKA (below knee amputation) ([place])   Renal transplant, status post   Abdominal pain   Diabetic gastroparesis associated with type 1 diabetes mellitus ([place])   Immunosuppressed status ([place])   H/O pancreas transplant ([place])   Hematemesis   Dehydration   Gastroenteritis due to COVID-19 virus  Hematemesis secondary to gastroenteritis/possible Mallory-Weiss -Continue PPI.  N.p.o. until endoscopic evaluation. -Monitor hemoglobin.  Currently stable, no transfusion required -Gastroenterology following.  COVID-19 infection -Oxygen levels-RA -Remdesivir-D2 -Decadrone/Solumedrol-D2 -Routine: Labs have been reviewed including ferritin, LDH, CRP, d-dimer, fibrinogen.  Will need to trend this lab daily. -Vitamin C & Zinc. Prone >16hrs/day.  -procalcitonin-0.15 -Supportive care-antitussive, inhalers, I-S/flutter -CODE STATUS confirmed  Mild Renal Insufficiency  -Baseline creatinine 1.5.  Admission creatinine 1.9, gentle hydration.  Monitor urine output.  Diabetic gastroparesis with history of diabetes mellitus type 1 -Insulin sliding scale and Accu-Chek.  Bilateral BKA -Supportive care  Essential hypertension, uncontrolled -P.o. medications on hold until endoscopic evaluation but in the meantime will give IV hydralazine or labetalol as needed  History of renal transplant 2013 Status post  pancreatic transplant   DVT prophylaxis: SCDs Code Status: Full code Family Communication: Spoke with his wife.  Disposition Plan: Maintain hospital stay for endoscopic evaluation and IV fluid hydration.  Consultants:   Gastroenterology  Procedures:   None  Antimicrobials:   None   Subjective: Feels quite nauseous otherwise no complaints.  This is giving him some nausea.  Review of Systems Otherwise negative except as per HPI, including: General: Denies fever, chills, night sweats or unintended weight loss. Resp: Denies cough, wheezing, shortness of breath. Cardiac: Denies chest pain, palpitations, orthopnea, paroxysmal nocturnal dyspnea. GI: Denies abdominal pain,  vomiting, diarrhea or constipation GU: Denies dysuria, frequency, hesitancy or incontinence MS: Denies muscle aches, joint pain or swelling Neuro: Denies headache, neurologic deficits (focal weakness, numbness, tingling), abnormal gait Psych: Denies anxiety, depression, SI/HI/AVH Skin: Denies new rashes or lesions ID: Denies sick contacts, exotic exposures, travel  Objective: Vitals:   01/16/20 2300 01/17/20 0000 01/17/20 0449 01/17/20 1300  BP:  133/74 (!) 174/77 (!) 162/75  Pulse:  82 85   Resp:  14 20   Temp:  98.8 F (37.1 C) 98.8 F (37.1 C)   TempSrc:  Oral Oral   SpO2:  95% 97%   Weight: 81.6 kg       Intake/Output Summary (Last 24 hours) at 01/17/2020 1421 Last data filed at 01/17/2020 0800 Gross per 24 hour  Intake 1328.91 ml  Output 300 ml  Net 1028.91 ml   Filed Weights   01/16/20 2300  Weight: 81.6 kg    Examination:  General exam: Appears calm and comfortable  Respiratory system: Mild bibasilar Rhonchi Cardiovascular system: S1 & S2 heard, RRR. No JVD, murmurs, rubs, gallops or clicks. No pedal edema. Gastrointestinal system: Abdomen is nondistended, soft and nontender. No organomegaly or masses felt. Normal bowel sounds heard. Central nervous system: Alert and oriented. No  focal neurological deficits. Extremities: b/l BKA Skin: No rashes, lesions or ulcers Psychiatry: Judgement and insight appear normal. Mood & affect appropriate.     Data Reviewed:   CBC: Recent Labs  Lab 01/16/20 0707 01/17/20 0213  WBC 6.4 7.0  NEUTROABS 4.2 5.8  HGB 18.5* 16.3  HCT 57.4* 50.6  MCV 96.5 95.5  PLT 220 423   Basic Metabolic Panel: Recent Labs  Lab 01/16/20 0707 01/17/20 0213  NA 139  --   K 4.3  --   CL 100  --   CO2 24  --   GLUCOSE 112*  --   BUN 23*  --   CREATININE 1.91*  --   CALCIUM 9.4  --   MG  --  1.8  PHOS  --  3.8   GFR: CrCl cannot be calculated (Unknown ideal weight.). Liver Function Tests: Recent Labs  Lab 01/16/20 0707  AST 22  ALT 22  ALKPHOS 80  BILITOT 0.6  PROT 7.7  ALBUMIN 3.9   No results for input(s): LIPASE, AMYLASE in the last 168 hours. No results for input(s): AMMONIA in the last 168 hours. Coagulation Profile: Recent Labs  Lab 01/16/20 0707  INR 0.9   Cardiac Enzymes: No results for input(s): CKTOTAL, CKMB, CKMBINDEX, TROPONINI in the last 168 hours. BNP (last 3 results) No results for input(s): PROBNP in the last 8760 hours. HbA1C: No results for input(s): HGBA1C in the last 72 hours. CBG: No results for input(s): GLUCAP in the last 168 hours. Lipid Profile: No results for input(s): CHOL, HDL, LDLCALC, TRIG, CHOLHDL, LDLDIRECT in the last 72 hours. Thyroid Function Tests: No results for input(s): TSH, T4TOTAL, FREET4, T3FREE, THYROIDAB in the last 72 hours. Anemia Panel: Recent Labs    01/16/20 2031 01/17/20 0213  FERRITIN 53 50   Sepsis Labs: Recent Labs  Lab 01/16/20 2031  PROCALCITON 0.15    Recent Results (from the past 240 hour(s))  SARS CORONAVIRUS 2 (TAT 6-24 HRS) Nasopharyngeal Nasopharyngeal Swab     Status: Abnormal   Collection Time: 01/16/20 12:45 PM   Specimen: Nasopharyngeal Swab  Result Value Ref Range Status   SARS Coronavirus 2 POSITIVE (A) NEGATIVE Final    Comment:  RESULT CALLED TO, READ BACK BY AND VERIFIED WITH: A MITCHELL,RN 1809 01/16/2020 D BRADLEY (NOTE) SARS-CoV-2 target nucleic acids are DETECTED. The SARS-CoV-2 RNA is generally detectable in upper and lower respiratory specimens during the acute phase of infection. Positive results are indicative of the presence of SARS-CoV-2 RNA. Clinical correlation with patient history and other diagnostic information is  necessary to determine patient infection status. Positive results do not rule out bacterial infection or co-infection with other viruses.  The expected result is Negative. Fact Sheet for Patients: SugarRoll.be Fact Sheet for Healthcare Providers: https://www.woods-mathews.com/ This test is not yet approved or cleared by the [place] FDA and  has been authorized for detection and/or diagnosis of SARS-CoV-2 by FDA under an Emergency Use Authorization (EUA). This EUA will remain  in effect (meaning this test can be used) for  the duration of the COVID-19 declaration under Section 564(b)(1) of the Act, 21 U.S.C. section 360bbb-3(b)(1), unless the authorization is terminated or revoked sooner. Performed at [facility], [place] [address], [place], [place] [address]          Radiology Studies: No results found.      Scheduled Meds: . aspirin EC  81 mg Oral Daily  . dexamethasone (DECADRON) injection  6 mg Intravenous Q24H  .  hydrALAZINE  75 mg Oral BID  . Ipratropium-Albuterol  1 puff Inhalation Q6H  . labetalol  300 mg Oral BID  . metoCLOPramide (REGLAN) injection  10 mg Intravenous Q6H  . mycophenolate  360 mg Oral BID  . oxyCODONE  15 mg Oral Q6H  . pantoprazole (PROTONIX) IV  40 mg Intravenous Q12H  . saccharomyces boulardii  250 mg Oral BID  . sulfamethoxazole-trimethoprim  1 tablet Oral Q M,W,F  . tacrolimus  2 mg Oral QHS  . tacrolimus  3 mg Oral Daily   Continuous Infusions: . sodium chloride 100 mL/hr at  01/17/20 0034  . ondansetron (ZOFRAN) IV    . remdesivir 100 mg in NS 100 mL 100 mg (01/17/20 1359)     LOS: 0 days   Time spent= 35 mins    Jakari Sada Arsenio Loader, MD [facility]  If 7PM-7AM, please contact night-coverage  01/17/2020, 2:21 PM

## 2020-01-17 NOTE — Progress Notes (Signed)
Updated wife Wylder Macomber on patient status and plan of care.

## 2020-01-17 NOTE — Progress Notes (Signed)
   Patient Name: [name] Date of Encounter: 01/17/2020, 4:17 PM    Subjective  Improved, no vomiting or hematemesis Severe heartburn with ginger ale though    Objective  BP (!) 195/100 (BP Location: Left Arm)   Pulse 98   Temp 98.9 F (37.2 C) (Oral)   Resp 20   Wt 81.6 kg   SpO2 97%   BMI 24.40 kg/m  NAD Eyes anicteric Abdomen soft and non-tender  CBC Latest Ref Rng & Units 01/17/2020 01/16/2020 10/28/2017  WBC 4.0 - 10.5 K/uL 7.0 6.4 9.9  Hemoglobin 13.0 - 17.0 g/dL 16.3 18.5(H) 15.7  Hematocrit 39.0 - 52.0 % 50.6 57.4(H) 48.7  Platelets 150 - 400 K/uL 182 220 247       Assessment and Plan  Nausea and vomiting and hematemesis (coffee ground) Covid 19 Gastroparesis/GERD S/p renal transplant immunosuppressed   I decided to cancel EGD in light of Covid + and improvement He was well until 2 d before ? If GI sxs were onset of Covid/  Probably has recurrent esophagitis, possible Mallory Weiss tear but he is improved  Stay on IV PPI bid Running Regan Advance diet as tolerated  Will f/u - Dr. Collene Mares on call As long as he continues to improve w/ medical tx would not pursue EGD - reserve for refractory sxs and terapeutics should need arise   Gatha Mayer, MD, [facility] Gastroenterology 01/17/2020 4:17 PM

## 2020-01-17 NOTE — Progress Notes (Signed)
Sonia Baller called from endo to obtain report on patient.  Report given. They will come for patient at 0800.

## 2020-01-18 LAB — C-REACTIVE PROTEIN: CRP: 1.1 mg/dL — ABNORMAL HIGH (ref <1.0)

## 2020-01-18 LAB — CBC WITH DIFFERENTIAL/PLATELET
Abs Immature Granulocytes: 0.03 10*3/uL (ref 0.00–0.07)
Basophils Absolute: 0 10*3/uL (ref 0.0–0.1)
Basophils Relative: 0 %
Eosinophils Absolute: 0 10*3/uL (ref 0.0–0.5)
Eosinophils Relative: 0 %
HCT: 49.5 % (ref 39.0–52.0)
Hemoglobin: 15.9 g/dL (ref 13.0–17.0)
Immature Granulocytes: 1 %
Lymphocytes Relative: 12 %
Lymphs Abs: 0.4 10*3/uL — ABNORMAL LOW (ref 0.7–4.0)
MCH: 30.6 pg (ref 26.0–34.0)
MCHC: 32.1 g/dL (ref 30.0–36.0)
MCV: 95.2 fL (ref 80.0–100.0)
Monocytes Absolute: 0.4 10*3/uL (ref 0.1–1.0)
Monocytes Relative: 9 %
Neutro Abs: 3 10*3/uL (ref 1.7–7.7)
Neutrophils Relative %: 78 %
Platelets: 185 10*3/uL (ref 150–400)
RBC: 5.2 MIL/uL (ref 4.22–5.81)
RDW: 14.3 % (ref 11.5–15.5)
WBC: 3.8 10*3/uL — ABNORMAL LOW (ref 4.0–10.5)
nRBC: 0 % (ref 0.0–0.2)

## 2020-01-18 LAB — BASIC METABOLIC PANEL
Anion gap: 11 (ref 5–15)
BUN: 23 mg/dL — ABNORMAL HIGH (ref 6–20)
CO2: 21 mmol/L — ABNORMAL LOW (ref 22–32)
Calcium: 8.3 mg/dL — ABNORMAL LOW (ref 8.9–10.3)
Chloride: 104 mmol/L (ref 98–111)
Creatinine, Ser: 1.6 mg/dL — ABNORMAL HIGH (ref 0.61–1.24)
GFR calc Af Amer: 59 mL/min — ABNORMAL LOW (ref >60)
GFR calc non Af Amer: 51 mL/min — ABNORMAL LOW (ref >60)
Glucose, Bld: 131 mg/dL — ABNORMAL HIGH (ref 70–99)
Potassium: 4.2 mmol/L (ref 3.5–5.1)
Sodium: 136 mmol/L (ref 135–145)

## 2020-01-18 LAB — FERRITIN: Ferritin: 43 ng/mL (ref 24–336)

## 2020-01-18 LAB — MAGNESIUM: Magnesium: 1.7 mg/dL (ref 1.7–2.4)

## 2020-01-18 LAB — PHOSPHORUS: Phosphorus: 2.7 mg/dL (ref 2.5–4.6)

## 2020-01-18 LAB — D-DIMER, QUANTITATIVE: D-Dimer, Quant: 0.83 ug/mL-FEU — ABNORMAL HIGH (ref 0.00–0.50)

## 2020-01-18 MED ORDER — HYDRALAZINE HCL 50 MG PO TABS
75.0000 mg | ORAL_TABLET | Freq: Three times a day (TID) | ORAL | Status: DC
  Administered 2020-01-18 – 2020-01-20 (×7): 75 mg via ORAL
  Filled 2020-01-18 (×7): qty 1

## 2020-01-18 MED ORDER — IPRATROPIUM-ALBUTEROL 20-100 MCG/ACT IN AERS: 1.0000 | INHALATION_SPRAY | Freq: Two times a day (BID) | RESPIRATORY_TRACT | Status: DC | PRN

## 2020-01-18 MED ORDER — IPRATROPIUM-ALBUTEROL 20-100 MCG/ACT IN AERS
1.0000 | INHALATION_SPRAY | Freq: Two times a day (BID) | RESPIRATORY_TRACT | Status: DC
  Administered 2020-01-18: 08:00:00 1 via RESPIRATORY_TRACT

## 2020-01-18 MED ORDER — FAMOTIDINE IN NACL 20-0.9 MG/50ML-% IV SOLN
20.0000 mg | INTRAVENOUS | Status: DC
  Administered 2020-01-18 – 2020-01-20 (×3): 20 mg via INTRAVENOUS
  Filled 2020-01-18 (×4): qty 50

## 2020-01-18 NOTE — Progress Notes (Signed)
Patient given Phenergan following 2nd episode of vomiting. Pt says he has dry heaved but has not vomited a third time. Denies chill/sweats.

## 2020-01-18 NOTE — Progress Notes (Signed)
Patient has had two episodes of vomiting and nausea. Earlier tonight, patient was very sweaty and complained of chills. Temp 98.8.  RN given antiemetic and initially experienced some relief. No longer experiencing chills/sweats. Patient does say he has heartburn and nausea still.

## 2020-01-18 NOTE — Progress Notes (Signed)
   01/18/20 1036  Vitals  BP (!) 173/72  MAP (mmHg) 101   PRN Labetalol administered. Will continue to monitor.

## 2020-01-18 NOTE — Progress Notes (Signed)
   01/18/20 1053  Family/Significant Other Communication  Family/Significant Other Update Called;Updated (pt's wife Jonelle Sidle)

## 2020-01-18 NOTE — Progress Notes (Signed)
[name]  TRR:[id number] DOB: May 12, 1976 DOA: 01/16/2020 PCP: Lance Sell, NP   Brief Narrative:  44 year old with a history of ESRD status post renal transplant on immunosuppressant, DM 1, HTN, GERD, diabetic gastroparesis came to the hospital with complaints of hematemesis.  Found to be COVID-19 positive and mild renal insufficiency.  Seen by gastroenterology who recommended endoscopic evaluation.He was started on remdesivir and steroids. GI recommended c   Assessment & Plan:   Principal Problem:   Acute kidney injury due to COVID-19 [facility]) Active Problems:   S/P bilateral BKA (below knee amputation) ([place])   Renal transplant, status post   Abdominal pain   Diabetic gastroparesis associated with type 1 diabetes mellitus ([place])   Immunosuppressed status ([place])   H/O pancreas transplant ([place])   Hematemesis   Dehydration   Gastroenteritis due to COVID-19 virus   GI bleed  Hematemesis secondary to gastroenteritis/possible Mallory-Weiss -Bleeding is subsided likely has esophagitis/gastritis -GI recommends conservative management -PPI twice daily, add Pepcid twice daily -Advance diet as tolerated  COVID-19 infection -Oxygen levels-RA -Remdesivir-D3 -Solumedrol-D3 -Routine: Labs have been reviewed including ferritin, LDH, CRP, d-dimer, fibrinogen.  Will need to trend this lab daily. -Vitamin C & Zinc. Prone >16hrs/day.  -procalcitonin-0.15 -Supportive care-antitussive, inhalers, I-S/flutter -CODE STATUS confirmed  Mild Renal Insufficiency  -Baseline creatinine 1.5.  Improved today to 1.6.  Diabetic gastroparesis with history of diabetes mellitus type 1 -Insulin sliding scale and Accu-Chek.  Bilateral BKA -Supportive care  Essential hypertension, uncontrolled -P.o. labetalol and hydralazine resume -Hydralazine increased to 3 times daily dosing  History of renal transplant 2013 Status post pancreatic transplant   DVT prophylaxis:  SCDs Code Status: Full code Family Communication: Patient's wife updated today Disposition Plan: Maintain hospital stay until he is able to tolerate oral diet.  Still nauseous this morning.  Consultants:   Gastroenterology  Procedures:   None  Antimicrobials:   None   Subjective: Continues to feel nauseous this morning.  Poor oral intake but denies any shortness of breath or other complaints.  Review of Systems Otherwise negative except as per HPI, including: General = no fevers, chills, dizziness,  fatigue HEENT/EYES = negative for loss of vision, double vision, blurred vision,  sore throa Cardiovascular= negative for chest pain, palpitation Respiratory/lungs= negative for shortness of breath, cough, wheezing; hemoptysis,  Gastrointestinal= negative forabdominal pain Genitourinary= negative for Dysuria MSK = Negative for arthralgia, myalgias Neurology= Negative for headache, numbness, tingling  Psychiatry= Negative for suicidal and homocidal ideation Skin= Negative for Rash   Objective: Vitals:   01/18/20 0533 01/18/20 0800 01/18/20 1036 01/18/20 1126  BP: (!) 174/79 (!) 167/87 (!) 173/72 (!) 153/74  Pulse: 90 91 86 86  Resp: 16 16 (!) 21 (!) 21  Temp: 98.9 F (37.2 C)     TempSrc: Oral     SpO2: 96% 91% 98% 97%  Weight:        Intake/Output Summary (Last 24 hours) at 01/18/2020 1155 Last data filed at 01/18/2020 0915 Gross per 24 hour  Intake 2411.82 ml  Output 1300 ml  Net 1111.82 ml   Filed Weights   01/16/20 2300  Weight: 81.6 kg    Examination:  Constitutional: Not in acute distress Respiratory: Clear to auscultation bilaterally Cardiovascular: Normal sinus rhythm, no rubs Abdomen: Nontender nondistended good bowel sounds Musculoskeletal: No edema noted; b/l BKA Skin: No rashes seen Neurologic: CN 2-12 grossly intact.  And nonfocal Psychiatric: Normal judgment and insight. Alert and oriented x 3. Normal  mood.      Data Reviewed:    CBC: Recent Labs  Lab 01/16/20 0707 01/17/20 0213 01/18/20 0252  WBC 6.4 7.0 3.8*  NEUTROABS 4.2 5.8 3.0  HGB 18.5* 16.3 15.9  HCT 57.4* 50.6 49.5  MCV 96.5 95.5 95.2  PLT 220 182 474   Basic Metabolic Panel: Recent Labs  Lab 01/16/20 0707 01/17/20 0213 01/18/20 0252  NA 139  --  136  K 4.3  --  4.2  CL 100  --  104  CO2 24  --  21*  GLUCOSE 112*  --  131*  BUN 23*  --  23*  CREATININE 1.91*  --  1.60*  CALCIUM 9.4  --  8.3*  MG  --  1.8 1.7  PHOS  --  3.8 2.7   GFR: CrCl cannot be calculated (Unknown ideal weight.). Liver Function Tests: Recent Labs  Lab 01/16/20 0707  AST 22  ALT 22  ALKPHOS 80  BILITOT 0.6  PROT 7.7  ALBUMIN 3.9   No results for input(s): LIPASE, AMYLASE in the last 168 hours. No results for input(s): AMMONIA in the last 168 hours. Coagulation Profile: Recent Labs  Lab 01/16/20 0707  INR 0.9   Cardiac Enzymes: No results for input(s): CKTOTAL, CKMB, CKMBINDEX, TROPONINI in the last 168 hours. BNP (last 3 results) No results for input(s): PROBNP in the last 8760 hours. HbA1C: No results for input(s): HGBA1C in the last 72 hours. CBG: No results for input(s): GLUCAP in the last 168 hours. Lipid Profile: No results for input(s): CHOL, HDL, LDLCALC, TRIG, CHOLHDL, LDLDIRECT in the last 72 hours. Thyroid Function Tests: No results for input(s): TSH, T4TOTAL, FREET4, T3FREE, THYROIDAB in the last 72 hours. Anemia Panel: Recent Labs    01/17/20 0213 01/18/20 0252  FERRITIN 50 43   Sepsis Labs: Recent Labs  Lab 01/16/20 2031  PROCALCITON 0.15    Recent Results (from the past 240 hour(s))  SARS CORONAVIRUS 2 (TAT 6-24 HRS) Nasopharyngeal Nasopharyngeal Swab     Status: Abnormal   Collection Time: 01/16/20 12:45 PM   Specimen: Nasopharyngeal Swab  Result Value Ref Range Status   SARS Coronavirus 2 POSITIVE (A) NEGATIVE Final    Comment: RESULT CALLED TO, READ BACK BY AND VERIFIED WITH: A MITCHELL,RN 1809 01/16/2020 D  BRADLEY (NOTE) SARS-CoV-2 target nucleic acids are DETECTED. The SARS-CoV-2 RNA is generally detectable in upper and lower respiratory specimens during the acute phase of infection. Positive results are indicative of the presence of SARS-CoV-2 RNA. Clinical correlation with patient history and other diagnostic information is  necessary to determine patient infection status. Positive results do not rule out bacterial infection or co-infection with other viruses.  The expected result is Negative. Fact Sheet for Patients: SugarRoll.be Fact Sheet for Healthcare Providers: https://www.woods-mathews.com/ This test is not yet approved or cleared by the [place] FDA and  has been authorized for detection and/or diagnosis of SARS-CoV-2 by FDA under an Emergency Use Authorization (EUA). This EUA will remain  in effect (meaning this test can be used) for  the duration of the COVID-19 declaration under Section 564(b)(1) of the Act, 21 U.S.C. section 360bbb-3(b)(1), unless the authorization is terminated or revoked sooner. Performed at [facility], [place] [address] [place], [place] [address]          Radiology Studies: No results found.      Scheduled Meds: . aspirin EC  81 mg Oral Daily  . dexamethasone (DECADRON) injection  6 mg Intravenous  Q24H  . hydrALAZINE  75 mg Oral BID  . Ipratropium-Albuterol  1 puff Inhalation BID  . labetalol  300 mg Oral BID  . metoCLOPramide (REGLAN) injection  10 mg Intravenous Q6H  . mycophenolate  360 mg Oral BID  . oxyCODONE  15 mg Oral Q6H  . pantoprazole (PROTONIX) IV  40 mg Intravenous Q12H  . saccharomyces boulardii  250 mg Oral BID  . sulfamethoxazole-trimethoprim  1 tablet Oral Q M,W,F  . tacrolimus  2 mg Oral QHS  . tacrolimus  3 mg Oral Daily   Continuous Infusions: . famotidine (PEPCID) IV 20 mg (01/18/20 0914)  . ondansetron (ZOFRAN) IV    . remdesivir 100 mg in NS 100 mL 100 mg  (01/18/20 0802)     LOS: 1 day   Time spent= 35 mins    Kendallyn Lippold Arsenio Loader, MD [facility]  If 7PM-7AM, please contact night-coverage  01/18/2020, 11:55 AM

## 2020-01-19 LAB — C-REACTIVE PROTEIN: CRP: 0.7 mg/dL (ref <1.0)

## 2020-01-19 LAB — CBC WITH DIFFERENTIAL/PLATELET
Abs Immature Granulocytes: 0.02 10*3/uL (ref 0.00–0.07)
Basophils Absolute: 0 10*3/uL (ref 0.0–0.1)
Basophils Relative: 0 %
Eosinophils Absolute: 0 10*3/uL (ref 0.0–0.5)
Eosinophils Relative: 0 %
HCT: 47.9 % (ref 39.0–52.0)
Hemoglobin: 15.8 g/dL (ref 13.0–17.0)
Immature Granulocytes: 1 %
Lymphocytes Relative: 13 %
Lymphs Abs: 0.5 10*3/uL — ABNORMAL LOW (ref 0.7–4.0)
MCH: 31 pg (ref 26.0–34.0)
MCHC: 33 g/dL (ref 30.0–36.0)
MCV: 94.1 fL (ref 80.0–100.0)
Monocytes Absolute: 0.5 10*3/uL (ref 0.1–1.0)
Monocytes Relative: 13 %
Neutro Abs: 2.8 10*3/uL (ref 1.7–7.7)
Neutrophils Relative %: 73 %
Platelets: 184 10*3/uL (ref 150–400)
RBC: 5.09 MIL/uL (ref 4.22–5.81)
RDW: 14.2 % (ref 11.5–15.5)
WBC: 3.8 10*3/uL — ABNORMAL LOW (ref 4.0–10.5)
nRBC: 0 % (ref 0.0–0.2)

## 2020-01-19 LAB — BASIC METABOLIC PANEL
Anion gap: 10 (ref 5–15)
BUN: 20 mg/dL (ref 6–20)
CO2: 24 mmol/L (ref 22–32)
Calcium: 8.3 mg/dL — ABNORMAL LOW (ref 8.9–10.3)
Chloride: 102 mmol/L (ref 98–111)
Creatinine, Ser: 1.54 mg/dL — ABNORMAL HIGH (ref 0.61–1.24)
GFR calc Af Amer: >60 mL/min (ref >60)
GFR calc non Af Amer: 54 mL/min — ABNORMAL LOW (ref >60)
Glucose, Bld: 119 mg/dL — ABNORMAL HIGH (ref 70–99)
Potassium: 4.2 mmol/L (ref 3.5–5.1)
Sodium: 136 mmol/L (ref 135–145)

## 2020-01-19 LAB — PHOSPHORUS: Phosphorus: 2.9 mg/dL (ref 2.5–4.6)

## 2020-01-19 LAB — MAGNESIUM: Magnesium: 1.7 mg/dL (ref 1.7–2.4)

## 2020-01-19 LAB — FERRITIN: Ferritin: 39 ng/mL (ref 24–336)

## 2020-01-19 LAB — D-DIMER, QUANTITATIVE: D-Dimer, Quant: 0.87 ug/mL-FEU — ABNORMAL HIGH (ref 0.00–0.50)

## 2020-01-19 MED ORDER — CLONIDINE HCL 0.1 MG PO TABS
0.1000 mg | ORAL_TABLET | Freq: Two times a day (BID) | ORAL | Status: DC
  Administered 2020-01-19 – 2020-01-20 (×3): 0.1 mg via ORAL
  Filled 2020-01-19 (×3): qty 1

## 2020-01-19 MED ORDER — OXYCODONE HCL 5 MG PO TABS: 15.0000 mg | ORAL_TABLET | Freq: Four times a day (QID) | ORAL | Status: DC | PRN

## 2020-01-19 MED ORDER — SUCRALFATE 1 GM/10ML PO SUSP
1.0000 g | Freq: Three times a day (TID) | ORAL | Status: DC
  Administered 2020-01-19 – 2020-01-20 (×6): 1 g via ORAL
  Filled 2020-01-19 (×6): qty 10

## 2020-01-19 NOTE — Progress Notes (Signed)
PROGRESS NOTE    [name]  DUK:[id number] DOB: [birth year] DOA: 01/16/2020 PCP: Lance Sell, NP   Brief Narrative:  44 year old with a history of ESRD status post renal transplant on immunosuppressant, DM 1, HTN, GERD, diabetic gastroparesis came to the hospital with complaints of hematemesis.  Found to be COVID-19 positive and mild renal insufficiency.  Seen by gastroenterology who recommended endoscopic evaluation.He was started on remdesivir and steroids. GI recommended conservative management with PPI.    Assessment & Plan:   Principal Problem:   Acute kidney injury due to COVID-19 [facility]) Active Problems:   S/P bilateral BKA (below knee amputation) ([place])   Renal transplant, status post   Abdominal pain   Diabetic gastroparesis associated with type 1 diabetes mellitus ([place])   Immunosuppressed status ([place])   H/O pancreas transplant ([place])   Hematemesis   Dehydration   Gastroenteritis due to COVID-19 virus   GI bleed  Hematemesis secondary to gastroenteritis/possible Mallory-Weiss Nausea vomiting, improved -Bleeding is subsided likely has esophagitis/gastritis -GI recommends conservative management -PPI twice daily, Pepcid twice daily. Carafate added.  -Advance diet as tolerated- Adv from Clears to Full to Soft. RN aware.   COVID-19 infection -Oxygen levels-RA -Remdesivir-D4 -Solumedrol-D4 -Routine: Labs have been reviewed including ferritin, LDH, CRP, d-dimer, fibrinogen.  Will need to trend this lab daily. -Vitamin C & Zinc. Prone >16hrs/day.  -procalcitonin-0.15 -Supportive care-antitussive, inhalers, I-S/flutter -CODE STATUS confirmed  Mild Renal Insufficiency  -Baseline creatinine 1.5.  Improved today to 1.54  Diabetic gastroparesis with history of diabetes mellitus type 1 -Insulin sliding scale and Accu-Chek.  Bilateral BKA -Supportive care  Essential hypertension, uncontrolled -P.o. labetalol and hydralazine resume -Hydralazine increased  to 3 times daily dosing. Added Clonidine 0.1mg  BID History of renal transplant 2013 Status post pancreatic transplant   DVT prophylaxis: SCDs Code Status: Full code Family Communication: Patient declined.  Disposition Plan: Maintain hospital stay until he is able to tolerate oral diet.  Tolearting liquid diet slowly. Hopefully d/c tomorrow.   Consultants:   Gastroenterology  Procedures:   None  Antimicrobials:   None   Subjective: Nausea is better. Tolerated clears, therefore ok with eating more.   Review of Systems Otherwise negative except as per HPI, including: General = no fevers, chills, dizziness,  fatigue HEENT/EYES = negative for loss of vision, double vision, blurred vision,  sore throa Cardiovascular= negative for chest pain, palpitation Respiratory/lungs= negative for shortness of breath, cough, wheezing; hemoptysis,  Gastrointestinal= negative for nausea, vomiting, abdominal pain Genitourinary= negative for Dysuria MSK = Negative for arthralgia, myalgias Neurology= Negative for headache, numbness, tingling  Psychiatry= Negative for suicidal and homocidal ideation Skin= Negative for Rash    Objective: Vitals:   01/18/20 2110 01/19/20 0545 01/19/20 0836 01/19/20 0918  BP: (!) 168/90 (!) 183/79 (!) 171/77 (!) 159/87  Pulse:      Resp:   19 20  Temp:  98.7 F (37.1 C)    TempSrc:  Oral    SpO2:   95%   Weight:        Intake/Output Summary (Last 24 hours) at 01/19/2020 1128 Last data filed at 01/19/2020 0900 Gross per 24 hour  Intake 927.69 ml  Output 1500 ml  Net -572.31 ml   Filed Weights   01/16/20 2300  Weight: 81.6 kg    Examination:  Constitutional: Not in acute distress Respiratory: Clear to auscultation bilaterally Cardiovascular: Normal sinus rhythm, no rubs Abdomen: Nontender nondistended good bowel sounds Musculoskeletal: No edema noted; b/l LE BKA Skin: No rashes  seen Neurologic: CN 2-12 grossly intact.  And  nonfocal Psychiatric: Normal judgment and insight. Alert and oriented x 3. Normal mood.      Data Reviewed:   CBC: Recent Labs  Lab 01/16/20 0707 01/17/20 0213 01/18/20 0252 01/19/20 0224  WBC 6.4 7.0 3.8* 3.8*  NEUTROABS 4.2 5.8 3.0 2.8  HGB 18.5* 16.3 15.9 15.8  HCT 57.4* 50.6 49.5 47.9  MCV 96.5 95.5 95.2 94.1  PLT 220 182 185 130   Basic Metabolic Panel: Recent Labs  Lab 01/16/20 0707 01/17/20 0213 01/18/20 0252 01/19/20 0224  NA 139  --  136 136  K 4.3  --  4.2 4.2  CL 100  --  104 102  CO2 24  --  21* 24  GLUCOSE 112*  --  131* 119*  BUN 23*  --  23* 20  CREATININE 1.91*  --  1.60* 1.54*  CALCIUM 9.4  --  8.3* 8.3*  MG  --  1.8 1.7 1.7  PHOS  --  3.8 2.7 2.9   GFR: CrCl cannot be calculated (Unknown ideal weight.). Liver Function Tests: Recent Labs  Lab 01/16/20 0707  AST 22  ALT 22  ALKPHOS 80  BILITOT 0.6  PROT 7.7  ALBUMIN 3.9   No results for input(s): LIPASE, AMYLASE in the last 168 hours. No results for input(s): AMMONIA in the last 168 hours. Coagulation Profile: Recent Labs  Lab 01/16/20 0707  INR 0.9   Cardiac Enzymes: No results for input(s): CKTOTAL, CKMB, CKMBINDEX, TROPONINI in the last 168 hours. BNP (last 3 results) No results for input(s): PROBNP in the last 8760 hours. HbA1C: No results for input(s): HGBA1C in the last 72 hours. CBG: No results for input(s): GLUCAP in the last 168 hours. Lipid Profile: No results for input(s): CHOL, HDL, LDLCALC, TRIG, CHOLHDL, LDLDIRECT in the last 72 hours. Thyroid Function Tests: No results for input(s): TSH, T4TOTAL, FREET4, T3FREE, THYROIDAB in the last 72 hours. Anemia Panel: Recent Labs    01/18/20 0252 01/19/20 0224  FERRITIN 43 39   Sepsis Labs: Recent Labs  Lab 01/16/20 2031  PROCALCITON 0.15    Recent Results (from the past 240 hour(s))  SARS CORONAVIRUS 2 (TAT 6-24 HRS) Nasopharyngeal Nasopharyngeal Swab     Status: Abnormal   Collection Time: 01/16/20 12:45 PM    Specimen: Nasopharyngeal Swab  Result Value Ref Range Status   SARS Coronavirus 2 POSITIVE (A) NEGATIVE Final    Comment: RESULT CALLED TO, READ BACK BY AND VERIFIED WITH: A MITCHELL,RN 1809 01/16/2020 D BRADLEY (NOTE) SARS-CoV-2 target nucleic acids are DETECTED. The SARS-CoV-2 RNA is generally detectable in upper and lower respiratory specimens during the acute phase of infection. Positive results are indicative of the presence of SARS-CoV-2 RNA. Clinical correlation with patient history and other diagnostic information is  necessary to determine patient infection status. Positive results do not rule out bacterial infection or co-infection with other viruses.  The expected result is Negative. Fact Sheet for Patients: SugarRoll.be Fact Sheet for Healthcare Providers: https://www.woods-mathews.com/ This test is not yet approved or cleared by the [place] FDA and  has been authorized for detection and/or diagnosis of SARS-CoV-2 by FDA under an Emergency Use Authorization (EUA). This EUA will remain  in effect (meaning this test can be used) for  the duration of the COVID-19 declaration under Section 564(b)(1) of the Act, 21 U.S.C. section 360bbb-3(b)(1), unless the authorization is terminated or revoked sooner. Performed at [facility], [address] [place],  [place] [address]          Radiology Studies: No results found.      Scheduled Meds: . aspirin EC  81 mg Oral Daily  . cloNIDine  0.1 mg Oral BID  . dexamethasone (DECADRON) injection  6 mg Intravenous Q24H  . hydrALAZINE  75 mg Oral Q8H  . labetalol  300 mg Oral BID  . metoCLOPramide (REGLAN) injection  10 mg Intravenous Q6H  . mycophenolate  360 mg Oral BID  . oxyCODONE  15 mg Oral Q6H  . pantoprazole (PROTONIX) IV  40 mg Intravenous Q12H  . saccharomyces boulardii  250 mg Oral BID  . sucralfate  1 g Oral TID WC & HS  . sulfamethoxazole-trimethoprim  1  tablet Oral Q M,W,F  . tacrolimus  2 mg Oral QHS  . tacrolimus  3 mg Oral Daily   Continuous Infusions: . famotidine (PEPCID) IV 20 mg (01/19/20 0918)  . ondansetron (ZOFRAN) IV    . remdesivir 100 mg in NS 100 mL 100 mg (01/19/20 0839)     LOS: 2 days   Time spent= 25 mins    Andrew Trego Arsenio Loader, MD [facility]  If 7PM-7AM, please contact night-coverage  01/19/2020, 11:28 AM

## 2020-01-19 NOTE — Progress Notes (Signed)
Offered to update pt's wife today on plan of care, pt declined and stated he will update her himself.

## 2020-01-20 LAB — CBC WITH DIFFERENTIAL/PLATELET
Abs Immature Granulocytes: 0.01 10*3/uL (ref 0.00–0.07)
Basophils Absolute: 0 10*3/uL (ref 0.0–0.1)
Basophils Relative: 0 %
Eosinophils Absolute: 0 10*3/uL (ref 0.0–0.5)
Eosinophils Relative: 0 %
HCT: 49.3 % (ref 39.0–52.0)
Hemoglobin: 16.5 g/dL (ref 13.0–17.0)
Immature Granulocytes: 0 %
Lymphocytes Relative: 15 %
Lymphs Abs: 0.6 10*3/uL — ABNORMAL LOW (ref 0.7–4.0)
MCH: 31.4 pg (ref 26.0–34.0)
MCHC: 33.5 g/dL (ref 30.0–36.0)
MCV: 93.9 fL (ref 80.0–100.0)
Monocytes Absolute: 0.7 10*3/uL (ref 0.1–1.0)
Monocytes Relative: 16 %
Neutro Abs: 2.8 10*3/uL (ref 1.7–7.7)
Neutrophils Relative %: 69 %
Platelets: 188 10*3/uL (ref 150–400)
RBC: 5.25 MIL/uL (ref 4.22–5.81)
RDW: 14 % (ref 11.5–15.5)
WBC: 4.1 10*3/uL (ref 4.0–10.5)
nRBC: 0 % (ref 0.0–0.2)

## 2020-01-20 LAB — BASIC METABOLIC PANEL
Anion gap: 12 (ref 5–15)
BUN: 24 mg/dL — ABNORMAL HIGH (ref 6–20)
CO2: 24 mmol/L (ref 22–32)
Calcium: 8.5 mg/dL — ABNORMAL LOW (ref 8.9–10.3)
Chloride: 101 mmol/L (ref 98–111)
Creatinine, Ser: 1.72 mg/dL — ABNORMAL HIGH (ref 0.61–1.24)
GFR calc Af Amer: 54 mL/min — ABNORMAL LOW (ref >60)
GFR calc non Af Amer: 47 mL/min — ABNORMAL LOW (ref >60)
Glucose, Bld: 190 mg/dL — ABNORMAL HIGH (ref 70–99)
Potassium: 4.1 mmol/L (ref 3.5–5.1)
Sodium: 137 mmol/L (ref 135–145)

## 2020-01-20 LAB — PHOSPHORUS: Phosphorus: 3.2 mg/dL (ref 2.5–4.6)

## 2020-01-20 LAB — D-DIMER, QUANTITATIVE: D-Dimer, Quant: 0.84 ug/mL-FEU — ABNORMAL HIGH (ref 0.00–0.50)

## 2020-01-20 LAB — MAGNESIUM: Magnesium: 2.1 mg/dL (ref 1.7–2.4)

## 2020-01-20 LAB — C-REACTIVE PROTEIN: CRP: 0.7 mg/dL (ref <1.0)

## 2020-01-20 LAB — FERRITIN: Ferritin: 41 ng/mL (ref 24–336)

## 2020-01-20 MED ORDER — METOCLOPRAMIDE HCL 10 MG PO TABS
10.0000 mg | ORAL_TABLET | Freq: Three times a day (TID) | ORAL | Status: DC
  Administered 2020-01-20 (×2): 10 mg via ORAL
  Filled 2020-01-20 (×2): qty 1

## 2020-01-20 MED ORDER — DEXAMETHASONE 6 MG PO TABS
6.0000 mg | ORAL_TABLET | Freq: Every day | ORAL | Status: DC
  Administered 2020-01-20: 6 mg via ORAL
  Filled 2020-01-20: qty 1

## 2020-01-20 MED ORDER — IPRATROPIUM-ALBUTEROL 20-100 MCG/ACT IN AERS: 1.0000 | INHALATION_SPRAY | Freq: Four times a day (QID) | RESPIRATORY_TRACT | 0 refills | Status: DC | PRN

## 2020-01-20 MED ORDER — OMEPRAZOLE 40 MG PO CPDR: 40.0000 mg | DELAYED_RELEASE_CAPSULE | Freq: Two times a day (BID) | ORAL | 0 refills | Status: DC

## 2020-01-20 MED ORDER — CLONIDINE HCL 0.1 MG PO TABS: 0.1000 mg | ORAL_TABLET | Freq: Two times a day (BID) | ORAL | 0 refills | Status: DC

## 2020-01-20 MED ORDER — FAMOTIDINE 20 MG PO TABS: 20.0000 mg | ORAL_TABLET | Freq: Two times a day (BID) | ORAL | 1 refills | Status: DC

## 2020-01-20 MED ORDER — SUCRALFATE 1 GM/10ML PO SUSP: 1.0000 g | Freq: Three times a day (TID) | ORAL | 1 refills | Status: DC

## 2020-01-20 NOTE — Progress Notes (Signed)
Patient tolerated soft diet for breakfast. Last dose of remdesivir administered. PIV removed and pt up to shower.

## 2020-01-20 NOTE — Care Management (Signed)
Pt has discharge order.  CM reviewed chart for TOC needs - no CM consults nor needs determined per chart review.  Discharge order signed.  CM signing off

## 2020-01-20 NOTE — Discharge Instructions (Signed)
Person Under Monitoring Name: [name]  Location: [place] [address]   Infection Prevention Recommendations for Individuals Confirmed to have, or Being Evaluated for, 2019 Novel Coronavirus (COVID-19) Infection Who Receive Care at Home  Individuals who are confirmed to have, or are being evaluated for, COVID-19 should follow the prevention steps below until a healthcare provider or local or state health department says they can return to normal activities.  Stay home except to get medical care You should restrict activities outside your home, except for getting medical care. Do not go to work, school, or public areas, and do not use public transportation or taxis.  Call ahead before visiting your doctor Before your medical appointment, call the healthcare provider and tell them that you have, or are being evaluated for, COVID-19 infection. This will help the healthcare provider's office take steps to keep other people from getting infected. Ask your healthcare provider to call the local or state health department.  Monitor your symptoms Seek prompt medical attention if your illness is worsening (e.g., difficulty breathing). Before going to your medical appointment, call the healthcare provider and tell them that you have, or are being evaluated for, COVID-19 infection. Ask your healthcare provider to call the local or state health department.  Wear a facemask You should wear a facemask that covers your nose and mouth when you are in the same room with other people and when you visit a healthcare provider. People who live with or visit you should also wear a facemask while they are in the same room with you.  Separate yourself from other people in your home As much as possible, you should stay in a different room from other people in your home. Also, you should use a separate bathroom, if available.  Avoid sharing household items You should not share  dishes, drinking glasses, cups, eating utensils, towels, bedding, or other items with other people in your home. After using these items, you should wash them thoroughly with soap and water.  Cover your coughs and sneezes Cover your mouth and nose with a tissue when you cough or sneeze, or you can cough or sneeze into your sleeve. Throw used tissues in a lined trash can, and immediately wash your hands with soap and water for at least 20 seconds or use an alcohol-based hand rub.  Wash your [facility] your hands often and thoroughly with soap and water for at least 20 seconds. You can use an alcohol-based hand sanitizer if soap and water are not available and if your hands are not visibly dirty. Avoid touching your eyes, nose, and mouth with unwashed hands.   Prevention Steps for Caregivers and Household Members of Individuals Confirmed to have, or Being Evaluated for, COVID-19 Infection Being Cared for in the Home  If you live with, or provide care at home for, a person confirmed to have, or being evaluated for, COVID-19 infection please follow these guidelines to prevent infection:  Follow healthcare provider's instructions Make sure that you understand and can help the patient follow any healthcare provider instructions for all care.  Provide for the patient's basic needs You should help the patient with basic needs in the home and provide support for getting groceries, prescriptions, and other personal needs.  Monitor the patient's symptoms If they are getting sicker, call his or her medical provider and tell them that the patient has, or is being evaluated for, COVID-19 infection. This will help the healthcare provider's  office take steps to keep other people from getting infected. Ask the healthcare provider to call the local or state health department.  Limit the number of people who have contact with the patient  If possible, have only one caregiver for the patient.  Other  household members should stay in another home or place of residence. If this is not possible, they should stay  in another room, or be separated from the patient as much as possible. Use a separate bathroom, if available.  Restrict visitors who do not have an essential need to be in the home.  Keep older adults, very young children, and other sick people away from the patient Keep older adults, very young children, and those who have compromised immune systems or chronic health conditions away from the patient. This includes people with chronic heart, lung, or kidney conditions, diabetes, and cancer.  Ensure good ventilation Make sure that shared spaces in the home have good air flow, such as from an air conditioner or an opened window, weather permitting.  Wash your hands often  Wash your hands often and thoroughly with soap and water for at least 20 seconds. You can use an alcohol based hand sanitizer if soap and water are not available and if your hands are not visibly dirty.  Avoid touching your eyes, nose, and mouth with unwashed hands.  Use disposable paper towels to dry your hands. If not available, use dedicated cloth towels and replace them when they become wet.  Wear a facemask and gloves  Wear a disposable facemask at all times in the room and gloves when you touch or have contact with the patient's blood, body fluids, and/or secretions or excretions, such as sweat, saliva, sputum, nasal mucus, vomit, urine, or feces.  Ensure the mask fits over your nose and mouth tightly, and do not touch it during use.  Throw out disposable facemasks and gloves after using them. Do not reuse.  Wash your hands immediately after removing your facemask and gloves.  If your personal clothing becomes contaminated, carefully remove clothing and launder. Wash your hands after handling contaminated clothing.  Place all used disposable facemasks, gloves, and other waste in a lined container before  disposing them with other household waste.  Remove gloves and wash your hands immediately after handling these items.  Do not share dishes, glasses, or other household items with the patient  Avoid sharing household items. You should not share dishes, drinking glasses, cups, eating utensils, towels, bedding, or other items with a patient who is confirmed to have, or being evaluated for, COVID-19 infection.  After the person uses these items, you should wash them thoroughly with soap and water.  Wash laundry thoroughly  Immediately remove and wash clothes or bedding that have blood, body fluids, and/or secretions or excretions, such as sweat, saliva, sputum, nasal mucus, vomit, urine, or feces, on them.  Wear gloves when handling laundry from the patient.  Read and follow directions on labels of laundry or clothing items and detergent. In general, wash and dry with the warmest temperatures recommended on the label.  Clean all areas the individual has used often  Clean all touchable surfaces, such as counters, tabletops, doorknobs, bathroom fixtures, toilets, phones, keyboards, tablets, and bedside tables, every day. Also, clean any surfaces that may have blood, body fluids, and/or secretions or excretions on them.  Wear gloves when cleaning surfaces the patient has come in contact with.  Use a diluted bleach solution (e.g., dilute bleach with 1  part bleach and 10 parts water) or a household disinfectant with a label that says EPA-registered for coronaviruses. To make a bleach solution at home, add 1 tablespoon of bleach to 1 quart (4 cups) of water. For a larger supply, add  cup of bleach to 1 gallon (16 cups) of water.  Read labels of cleaning products and follow recommendations provided on product labels. Labels contain instructions for safe and effective use of the cleaning product including precautions you should take when applying the product, such as wearing gloves or eye protection  and making sure you have good ventilation during use of the product.  Remove gloves and wash hands immediately after cleaning.  Monitor yourself for signs and symptoms of illness Caregivers and household members are considered close contacts, should monitor their health, and will be asked to limit movement outside of the home to the extent possible. Follow the monitoring steps for close contacts listed on the symptom monitoring form.   ? If you have additional questions, contact your local health department or call the epidemiologist on call at [phone] (available 24/7). ? This guidance is subject to change. For the most up-to-date guidance from [facility], please refer to their website: YouBlogs.pl

## 2020-01-20 NOTE — Progress Notes (Signed)
Patient stable for discharge. Patient states he cannot find a ride home until his wife (RN at [name]) gets off at [place]. Dr. Reesa Chew made aware of situation.

## 2020-01-20 NOTE — Discharge Planning (Signed)
Nsg Discharge Note  Admit Date:  01/16/2020 Discharge date: 01/20/2020   [name] to be D/C'd Home per MD order.  AVS completed.     Discharge Medication: Allergies as of 01/20/2020      Reactions   Lisinopril    Cough   Amlodipine Rash      Medication List    TAKE these medications   aspirin EC 81 MG tablet Take 81 mg by mouth daily.   augmented betamethasone dipropionate 0.05 % ointment Commonly known as: DIPROLENE-AF Apply 1 application topically 2 (two) times daily as needed.   cloNIDine 0.1 MG tablet Commonly known as: CATAPRES Take 1 tablet (0.1 mg total) by mouth 2 (two) times daily.   famotidine 20 MG tablet Commonly known as: PEPCID Take 1 tablet (20 mg total) by mouth 2 (two) times daily.   hydrALAZINE 50 MG tablet Commonly known as: APRESOLINE Take 1 tablet (50 mg total) by mouth 3 (three) times daily. What changed:   how much to take  when to take this   Ipratropium-Albuterol 20-100 MCG/ACT Aers respimat Commonly known as: COMBIVENT Inhale 1 puff into the lungs every 6 (six) hours as needed for wheezing or shortness of breath.   labetalol 100 MG tablet Commonly known as: NORMODYNE Take 1 tablet (100 mg total) by mouth 2 (two) times daily.   labetalol 200 MG tablet Commonly known as: NORMODYNE TK 1 T PO BID   metoCLOPramide 10 MG tablet Commonly known as: Reglan Take 1 tablet (10 mg total) by mouth 4 (four) times daily. Take 1tab 20-30 minutes before breakfast, lunch, dinner and at bedtime   Myfortic 180 MG EC tablet Generic drug: mycophenolate Take 360 mg by mouth 2 (two) times daily.   omeprazole 40 MG capsule Commonly known as: PRILOSEC Take 1 capsule (40 mg total) by mouth 2 (two) times daily before a meal. What changed: See the new instructions.   ondansetron 4 MG disintegrating tablet Commonly known as: Zofran ODT Take 1 tablet (4 mg total) by mouth every 8 (eight) hours as needed for nausea or vomiting.   ondansetron 4 MG  tablet Commonly known as: ZOFRAN TK 1 T PO BID PRN   oxyCODONE HCl 15 MG Taba Take 15 mg by mouth 4 (four) times daily.   predniSONE 5 MG tablet Commonly known as: DELTASONE Take 5 mg by mouth daily.   promethazine 25 MG tablet Commonly known as: PHENERGAN Take 25 mg by mouth every 6 (six) hours as needed for nausea or vomiting.   saccharomyces boulardii 250 MG capsule Commonly known as: FLORASTOR Take 250 mg by mouth 2 (two) times daily.   sucralfate 1 GM/10ML suspension Commonly known as: Carafate Take 10 mLs (1 g total) by mouth 4 (four) times daily -  with meals and at bedtime for 21 days.   sulfamethoxazole-trimethoprim 400-80 MG tablet Commonly known as: BACTRIM Take 1 tablet by mouth every Monday, Wednesday, and Friday.   tacrolimus 1 MG capsule Commonly known as: PROGRAF Take 2-3 mg by mouth 2 (two) times daily. 3 mg in the morning, 2 mg in the evening       Discharge Assessment: Vitals:   01/20/20 0511 01/20/20 0821  BP: 127/69 137/79  Pulse:    Resp: 15 (!) 22  Temp: 98.3 F (36.8 C)   SpO2:  94%   Skin clean, dry and intact without evidence of skin break down, no evidence of skin tears noted. IV catheter discontinued intact. Site without signs and  symptoms of complications - no redness or edema noted at insertion site, patient denies c/o pain - only slight tenderness at site.  Dressing with slight pressure applied.  D/c Instructions-Education: Discharge instructions given to patient/family with verbalized understanding. D/c education completed with patient/family including follow up instructions, medication list, d/c activities limitations if indicated, with other d/c instructions as indicated by MD - patient able to verbalize understanding, all questions fully answered. Patient instructed to return to ED, call 911, or call MD for any changes in condition.  Patient escorted via [place], and D/C home via private auto.  Hiram Comber, RN 01/20/2020 10:44 AM

## 2020-01-20 NOTE — Discharge Summary (Signed)
Physician Discharge Summary  [name] PPI:[id number] DOB: Apr 07, 1976 DOA: 01/16/2020  PCP: Lance Sell, NP  Admit date: 01/16/2020 Discharge date: 01/20/2020  Admitted From: Home Disposition: Home  Recommendations for Outpatient Follow-up:  1. Follow up with PCP in 1-2 weeks 2. Please obtain BMP/CBC in one week your next doctors visit.  3. PPI, Pepcid and Carafate prescribed.  Would benefit from outpatient follow-up with gastroenterology for endoscopy hopefully in the next 2-4 weeks 4. Prescription for bronchodilator and clonidine given as well. 5. Recommend to resume his home daily prednisone 6. And advised to follow-up with his nephrologist   Discharge Condition: Stable CODE STATUS: Full code Diet recommendation: Renal diet with 2 g salt  Brief/Interim Summary: 44 year old with a history of ESRD status post renal transplant on immunosuppressant, DM 1, HTN, GERD, diabetic gastroparesis came to the hospital with complaints of hematemesis.  Found to be COVID-19 positive and mild renal insufficiency.  Seen by gastroenterology who recommended endoscopic evaluation.He was started on remdesivir and steroids. GI recommended conservative management with PPI.   Hemoptysis subsided with conservative management, hemoglobin remained stable.  He was started on antiacid medications which helped him and started tolerating oral diet.  He completed 5-day course of remdesivir in the hospital.  Steroids was transitioned to oral of the time of discharge. Today he is medically stable to be discharged with outpatient follow-up recommendations as stated above.   Hematemesis secondary to gastroenteritis/possible Mallory-Weiss Nausea vomiting, resolved -Bleeding is subsided likely has esophagitis/gastritis -GI recommended conservative management, follow-up outpatient. -Antiacid medications prescribed.  Would benefit from outpatient endoscopy in the next few weeks.  COVID-19  infection -Oxygen levels-RA -Completed remdesivir.  Solu-Medrol transition to oral Decadron upon discharge.  Mild Renal Insufficiency  -Baseline creatinine 1.5.  Stable  Diabetic gastroparesis with history of diabetes mellitus type 1 -Insulin sliding scale and Accu-Chek.  Bilateral BKA -Supportive care  Essential hypertension, uncontrolled -P.o. labetalol and hydralazine resume -Hydralazine increased to 3 times daily dosing. Added Clonidine 0.1mg  BID prescription given  History of renal transplant 2013 Status post pancreatic transplant  Discharge Diagnoses:  Principal Problem:   Acute kidney injury due to COVID-19 [facility]) Active Problems:   S/P bilateral BKA (below knee amputation) ([place])   Renal transplant, status post   Abdominal pain   Diabetic gastroparesis associated with type 1 diabetes mellitus ([place])   Immunosuppressed status ([place])   H/O pancreas transplant ([place])   Hematemesis   Dehydration   Gastroenteritis due to COVID-19 virus   GI bleed    Consultations:  Gastroenterology  Subjective: Feels great, no complaints.  Denies any nausea vomiting.  Tolerating oral diet.  Discharge Exam: Vitals:   01/20/20 0511 01/20/20 0821  BP: 127/69 137/79  Pulse:    Resp: 15 (!) 22  Temp: 98.3 F (36.8 C)   SpO2:  94%   Vitals:   01/19/20 2056 01/19/20 2058 01/20/20 0511 01/20/20 0821  BP: 132/76  127/69 137/79  Pulse:      Resp:   15 (!) 22  Temp:  98.4 F (36.9 C) 98.3 F (36.8 C)   TempSrc:  Oral Oral   SpO2:    94%  Weight:        General: Pt is alert, awake, not in acute distress Cardiovascular: RRR, S1/S2 +, no rubs, no gallops Respiratory: CTA bilaterally, no wheezing, no rhonchi Abdominal: Soft, NT, ND, bowel sounds + Extremities: no edema, no cyanosis, bilateral BKA  Discharge Instructions  Discharge Instructions    MyChart COVID-19 home  monitoring program   Complete by: Jan 20, 2020    Is the patient willing to use the [place]  for home monitoring?: Yes   Temperature monitoring   Complete by: Jan 20, 2020    After how many days would you like to receive a notification of this patient's flowsheet entries?: 1     Allergies as of 01/20/2020      Reactions   Lisinopril    Cough   Amlodipine Rash      Medication List    TAKE these medications   aspirin EC 81 MG tablet Take 81 mg by mouth daily.   augmented betamethasone dipropionate 0.05 % ointment Commonly known as: DIPROLENE-AF Apply 1 application topically 2 (two) times daily as needed.   cloNIDine 0.1 MG tablet Commonly known as: CATAPRES Take 1 tablet (0.1 mg total) by mouth 2 (two) times daily.   famotidine 20 MG tablet Commonly known as: PEPCID Take 1 tablet (20 mg total) by mouth 2 (two) times daily.   hydrALAZINE 50 MG tablet Commonly known as: APRESOLINE Take 1 tablet (50 mg total) by mouth 3 (three) times daily. What changed:   how much to take  when to take this   Ipratropium-Albuterol 20-100 MCG/ACT Aers respimat Commonly known as: COMBIVENT Inhale 1 puff into the lungs every 6 (six) hours as needed for wheezing or shortness of breath.   labetalol 100 MG tablet Commonly known as: NORMODYNE Take 1 tablet (100 mg total) by mouth 2 (two) times daily.   labetalol 200 MG tablet Commonly known as: NORMODYNE TK 1 T PO BID   metoCLOPramide 10 MG tablet Commonly known as: Reglan Take 1 tablet (10 mg total) by mouth 4 (four) times daily. Take 1tab 20-30 minutes before breakfast, lunch, dinner and at bedtime   Myfortic 180 MG EC tablet Generic drug: mycophenolate Take 360 mg by mouth 2 (two) times daily.   omeprazole 40 MG capsule Commonly known as: PRILOSEC Take 1 capsule (40 mg total) by mouth 2 (two) times daily before a meal. What changed: See the new instructions.   ondansetron 4 MG disintegrating tablet Commonly known as: Zofran ODT Take 1 tablet (4 mg total) by mouth every 8 (eight) hours as needed for nausea or  vomiting.   ondansetron 4 MG tablet Commonly known as: ZOFRAN TK 1 T PO BID PRN   oxyCODONE HCl 15 MG Taba Take 15 mg by mouth 4 (four) times daily.   predniSONE 5 MG tablet Commonly known as: DELTASONE Take 5 mg by mouth daily.   promethazine 25 MG tablet Commonly known as: PHENERGAN Take 25 mg by mouth every 6 (six) hours as needed for nausea or vomiting.   saccharomyces boulardii 250 MG capsule Commonly known as: FLORASTOR Take 250 mg by mouth 2 (two) times daily.   sucralfate 1 GM/10ML suspension Commonly known as: Carafate Take 10 mLs (1 g total) by mouth 4 (four) times daily -  with meals and at bedtime for 21 days.   sulfamethoxazole-trimethoprim 400-80 MG tablet Commonly known as: BACTRIM Take 1 tablet by mouth every Monday, Wednesday, and Friday.   tacrolimus 1 MG capsule Commonly known as: PROGRAF Take 2-3 mg by mouth 2 (two) times daily. 3 mg in the morning, 2 mg in the evening      Follow-up Information    Lance Sell, NP. Schedule an appointment as soon as possible for a visit in 2 week(s).   Specialty: Nurse Practitioner Contact information: [place]  [place] [address] [phone]        Gatha Mayer, MD. Schedule an appointment as soon as possible for a visit in 4 week(s).   Specialty: Gastroenterology Contact information: [address] [address] [phone]          Allergies  Allergen Reactions  . Lisinopril     Cough  . Amlodipine Rash    You were cared for by a hospitalist during your hospital stay. If you have any questions about your discharge medications or the care you received while you were in the hospital after you are discharged, you can call the unit and asked to speak with the hospitalist on call if the hospitalist that took care of you is not available. Once you are discharged, your primary care physician will handle any further medical issues. Please note that no refills for any  discharge medications will be authorized once you are discharged, as it is imperative that you return to your primary care physician (or establish a relationship with a primary care physician if you do not have one) for your aftercare needs so that they can reassess your need for medications and monitor your lab values.   Procedures/Studies:  No results found.   The results of significant diagnostics from this hospitalization (including imaging, microbiology, ancillary and laboratory) are listed below for reference.     Microbiology: Recent Results (from the past 240 hour(s))  SARS CORONAVIRUS 2 (TAT 6-24 HRS) Nasopharyngeal Nasopharyngeal Swab     Status: Abnormal   Collection Time: 01/16/20 12:45 PM   Specimen: Nasopharyngeal Swab  Result Value Ref Range Status   SARS Coronavirus 2 POSITIVE (A) NEGATIVE Final    Comment: RESULT CALLED TO, READ BACK BY AND VERIFIED WITH: A MITCHELL,RN 1809 01/16/2020 D BRADLEY (NOTE) SARS-CoV-2 target nucleic acids are DETECTED. The SARS-CoV-2 RNA is generally detectable in upper and lower respiratory specimens during the acute phase of infection. Positive results are indicative of the presence of SARS-CoV-2 RNA. Clinical correlation with patient history and other diagnostic information is  necessary to determine patient infection status. Positive results do not rule out bacterial infection or co-infection with other viruses.  The expected result is Negative. Fact Sheet for Patients: SugarRoll.be Fact Sheet for Healthcare Providers: https://www.woods-mathews.com/ This test is not yet approved or cleared by the [place] FDA and  has been authorized for detection and/or diagnosis of SARS-CoV-2 by FDA under an Emergency Use Authorization (EUA). This EUA will remain  in effect (meaning this test can be used) for  the duration of the COVID-19 declaration under Section 564(b)(1) of the Act, 21  U.S.C. section 360bbb-3(b)(1), unless the authorization is terminated or revoked sooner. Performed at [facility], [place] [address] [place], [place] [address]      Labs: BNP (last 3 results) No results for input(s): BNP in the last 8760 hours. Basic Metabolic Panel: Recent Labs  Lab 01/16/20 0707 01/17/20 0213 01/18/20 0252 01/19/20 0224 01/20/20 0500  NA 139  --  136 136 137  K 4.3  --  4.2 4.2 4.1  CL 100  --  104 102 101  CO2 24  --  21* 24 24  GLUCOSE 112*  --  131* 119* 190*  BUN 23*  --  23* 20 24*  CREATININE 1.91*  --  1.60* 1.54* 1.72*  CALCIUM 9.4  --  8.3* 8.3* 8.5*  MG  --  1.8 1.7 1.7 2.1  PHOS  --  3.8 2.7 2.9 3.2   Liver Function Tests: Recent Labs  Lab 01/16/20 0707  AST 22  ALT 22  ALKPHOS 80  BILITOT 0.6  PROT 7.7  ALBUMIN 3.9   No results for input(s): LIPASE, AMYLASE in the last 168 hours. No results for input(s): AMMONIA in the last 168 hours. CBC: Recent Labs  Lab 01/16/20 0707 01/17/20 0213 01/18/20 0252 01/19/20 0224 01/20/20 0500  WBC 6.4 7.0 3.8* 3.8* 4.1  NEUTROABS 4.2 5.8 3.0 2.8 2.8  HGB 18.5* 16.3 15.9 15.8 16.5  HCT 57.4* 50.6 49.5 47.9 49.3  MCV 96.5 95.5 95.2 94.1 93.9  PLT 220 182 185 184 188   Cardiac Enzymes: No results for input(s): CKTOTAL, CKMB, CKMBINDEX, TROPONINI in the last 168 hours. BNP: Invalid input(s): POCBNP CBG: No results for input(s): GLUCAP in the last 168 hours. D-Dimer Recent Labs    01/19/20 0224 01/20/20 0500  DDIMER 0.87* 0.84*   Hgb A1c No results for input(s): HGBA1C in the last 72 hours. Lipid Profile No results for input(s): CHOL, HDL, LDLCALC, TRIG, CHOLHDL, LDLDIRECT in the last 72 hours. Thyroid function studies No results for input(s): TSH, T4TOTAL, T3FREE, THYROIDAB in the last 72 hours.  Invalid input(s): FREET3 Anemia work up Recent Labs    01/19/20 0224 01/20/20 0500  FERRITIN 39 41   Urinalysis    Component Value Date/Time   COLORURINE AMBER (A)  10/11/2016 2328   APPEARANCEUR CLEAR 10/11/2016 2328   LABSPEC 1.026 10/11/2016 2328   PHURINE 6.0 10/11/2016 2328   GLUCOSEU NEGATIVE 10/11/2016 2328   HGBUR NEGATIVE 10/11/2016 2328   HGBUR negative 10/08/2010 0853   BILIRUBINUR MODERATE (A) 10/11/2016 2328   BILIRUBINUR SMALL 12/14/2015 1139   KETONESUR NEGATIVE 10/11/2016 2328   PROTEINUR 30 (A) 10/11/2016 2328   UROBILINOGEN 1.0 12/14/2015 1139   UROBILINOGEN 0.2 09/13/2015 0051   NITRITE NEGATIVE 10/11/2016 2328   LEUKOCYTESUR NEGATIVE 10/11/2016 2328   Sepsis Labs Invalid input(s): PROCALCITONIN,  WBC,  LACTICIDVEN Microbiology Recent Results (from the past 240 hour(s))  SARS CORONAVIRUS 2 (TAT 6-24 HRS) Nasopharyngeal Nasopharyngeal Swab     Status: Abnormal   Collection Time: 01/16/20 12:45 PM   Specimen: Nasopharyngeal Swab  Result Value Ref Range Status   SARS Coronavirus 2 POSITIVE (A) NEGATIVE Final    Comment: RESULT CALLED TO, READ BACK BY AND VERIFIED WITH: A MITCHELL,RN 1809 01/16/2020 D BRADLEY (NOTE) SARS-CoV-2 target nucleic acids are DETECTED. The SARS-CoV-2 RNA is generally detectable in upper and lower respiratory specimens during the acute phase of infection. Positive results are indicative of the presence of SARS-CoV-2 RNA. Clinical correlation with patient history and other diagnostic information is  necessary to determine patient infection status. Positive results do not rule out bacterial infection or co-infection with other viruses.  The expected result is Negative. Fact Sheet for Patients: SugarRoll.be Fact Sheet for Healthcare Providers: https://www.woods-mathews.com/ This test is not yet approved or cleared by the [place] FDA and  has been authorized for detection and/or diagnosis of SARS-CoV-2 by FDA under an Emergency Use Authorization (EUA). This EUA will remain  in effect (meaning this test can be used) for  the duration of the COVID-19  declaration under Section 564(b)(1) of the Act, 21 U.S.C. section 360bbb-3(b)(1), unless the authorization is terminated or revoked sooner. Performed at [facility], [place] [address], [place], [place] [address]      Time coordinating discharge:  I have spent 35 minutes face to face with the patient and on the  ward discussing the patients care, assessment, plan and disposition with other care givers. >50% of the time was devoted counseling the patient about the risks and benefits of treatment/Discharge disposition and coordinating care.   SIGNED:   Damita Lack, MD  [facility] 01/20/2020, 11:07 AM   If 7PM-7AM, please contact night-coverage

## 2020-01-20 NOTE — Care Management Important Message (Signed)
Important Message  Patient Details  Name: MONT JAGODA MRN: [id number] Date of Birth: 09/11/1976   Medicare Important Message Given:  Yes - Important Message mailed due to current National Emergency  Verbal consent obtained due to current National Emergency  Relationship to patient: Self Contact Name: Desmond Szabo Call Date: 01/20/20  Time: 1322 Phone: [phone] Outcome: Spoke with contact Important Message mailed to: Patient address on file    Delorse Lek 01/20/2020, 1:22 PM

## 2020-01-20 NOTE — Progress Notes (Signed)
Pt slept throughout night.  Denied any pain or nausea.

## 2020-01-20 NOTE — Progress Notes (Signed)
Patient tolerated full liquids for lunch and soft diet for dinner. Denied any nausea/vomiting/discomfort. Pt up in chair and ambulates independently throughout the room. Pt updated wife via phone call while RN was in the room.

## 2020-01-27 ENCOUNTER — Ambulatory Visit (INDEPENDENT_AMBULATORY_CARE_PROVIDER_SITE_OTHER): Payer: Medicare Other | Admitting: Family

## 2020-01-27 DIAGNOSIS — K920 Hematemesis: Secondary | ICD-10-CM | POA: Diagnosis not present

## 2020-01-27 DIAGNOSIS — Z89512 Acquired absence of left leg below knee: Secondary | ICD-10-CM

## 2020-01-27 DIAGNOSIS — U071 COVID-19: Secondary | ICD-10-CM

## 2020-01-27 DIAGNOSIS — Z89511 Acquired absence of right leg below knee: Secondary | ICD-10-CM | POA: Diagnosis not present

## 2020-01-27 NOTE — Progress Notes (Signed)
ARCHIBALD MARCHETTA is a 44 y.o. male with the following history as recorded in EpicCare:  Patient Active Problem List   Diagnosis Date Noted  . GI bleed 01/17/2020  . Acute kidney injury due to COVID-19 ([place]) 01/16/2020  . Dehydration 01/16/2020  . Gastroenteritis due to COVID-19 virus 01/16/2020  . Olecranon bursitis of left elbow 09/16/2018  . CKD (chronic kidney disease), stage III 10/26/2017  . Abnormal EKG 10/26/2017  . Hypertension 08/22/2017  . Acute GI bleeding 07/17/2017  . Hypertensive urgency 12/29/2016  . Hematemesis   . Healthcare maintenance 08/26/2016  . Back pain 12/15/2015  . History of diabetes mellitus 02/20/2015  . Abdominal pain 09/10/2014  . Weakness of left upper extremity 07/03/2014  . Impaired mobility and activities of daily living 07/03/2014  . Sternoclavicular joint pain 03/04/2014  . Renal transplant, status post 07/19/2013  . History of pancreas transplant ([place]) 07/19/2013  . Immunosuppressed status ([place]) 06/27/2013  . H/O pancreas transplant ([place]) 06/27/2013  . Diabetic gastroparesis associated with type 1 diabetes mellitus ([place]) 05/08/2013  . BKA stump complication ([place]) 37/09/6268  . S/P bilateral BKA (below knee amputation) ([place]) 08/17/2012  . Metabolic bone disease 48/54/6270  . ESRD (end stage renal disease) ([place]) 11/17/2009  . GERD 03/14/2007  . HLD (hyperlipidemia) 02/22/2007  . Major depressive disorder, recurrent episode ([place]) 02/22/2007  . POST TRAUMATIC STRESS DISORDER 02/22/2007  . 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 aspirin EC 81 MG tablet Take 81 mg by mouth daily.    [name] augmented betamethasone dipropionate (DIPROLENE-AF) 0.05 % ointment Apply 1 application topically 2 (two) times daily as needed.    . cloNIDine (CATAPRES) 0.1 MG tablet Take 1 tablet (0.1 mg total) by mouth 2 (two) times daily. 60 tablet 0  . famotidine (PEPCID) 20 MG tablet Take 1 tablet (20 mg total)  by mouth 2 (two) times daily. 60 tablet 1  . hydrALAZINE (APRESOLINE) 50 MG tablet Take 1 tablet (50 mg total) by mouth 3 (three) times daily. (Patient taking differently: Take 75 mg by mouth 2 (two) times daily. ) 30 tablet 0  . Ipratropium-Albuterol (COMBIVENT) 20-100 MCG/ACT AERS respimat Inhale 1 puff into the lungs every 6 (six) hours as needed for wheezing or shortness of breath. 4 g 0  . labetalol (NORMODYNE) 100 MG tablet Take 1 tablet (100 mg total) by mouth 2 (two) times daily. 30 tablet 0  . labetalol (NORMODYNE) 200 MG tablet TK 1 T PO BID  6  . metoCLOPramide (REGLAN) 10 MG tablet Take 1 tablet (10 mg total) by mouth 4 (four) times daily. Take 1tab 20-30 minutes before breakfast, lunch, dinner and at bedtime 120 tablet 1  . mycophenolate (MYFORTIC) 180 MG EC tablet Take 360 mg by mouth 2 (two) times daily.     [name] omeprazole (PRILOSEC) 40 MG capsule Take 1 capsule (40 mg total) by mouth 2 (two) times daily before a meal. 60 capsule 0  . ondansetron (ZOFRAN ODT) 4 MG disintegrating tablet Take 1 tablet (4 mg total) by mouth every 8 (eight) hours as needed for nausea or vomiting. (Patient not taking: Reported on 01/16/2020) 20 tablet 0  . ondansetron (ZOFRAN) 4 MG tablet TK 1 T PO BID PRN    . OxyCODONE HCl 15 MG TABA Take 15 mg by mouth 4 (four) times daily.    . predniSONE (DELTASONE) 5 MG tablet Take 5 mg by mouth daily.     . promethazine (  PHENERGAN) 25 MG tablet Take 25 mg by mouth every 6 (six) hours as needed for nausea or vomiting.    . saccharomyces boulardii (FLORASTOR) 250 MG capsule Take 250 mg by mouth 2 (two) times daily.    . sucralfate (CARAFATE) 1 GM/10ML suspension Take 10 mLs (1 g total) by mouth 4 (four) times daily -  with meals and at bedtime for 21 days. 420 mL 1  . sulfamethoxazole-trimethoprim (BACTRIM,SEPTRA) 400-80 MG per tablet Take 1 tablet by mouth every Monday, Wednesday, and Friday.     . tacrolimus (PROGRAF) 1 MG capsule Take 2-3 mg by mouth 2 (two) times  daily. 3 mg in the morning, 2 mg in the evening     No current facility-administered medications for this visit.    Allergies: Lisinopril and Amlodipine  Past Medical History:  Diagnosis Date  . 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 Arthritis    "I think I do; just in my fingers & my hands"  . Blood transfusion   . Cataract   . Chronic pain   . Depression    Patient states he has never been depressed.  . Diabetes mellitus without complication ([place])    no since pancreas transplant  . Dialysis patient [facility]) 04/18/12   "[facility]; Tues, [place], [place]"  . Edema   . ESRD (end stage renal disease) on dialysis ([place])   . Gastroparesis   . Gastropathy   . GERD (gastroesophageal reflux disease)   . Headache(784.0)   . Hypertension     Past Surgical History:  Procedure Laterality Date  . AV FISTULA PLACEMENT  08/2011   left upper arm  . BELOW KNEE LEG AMPUTATION  "it's been awhile"   bilaterally  . CATARACT EXTRACTION  ~ 2011   right  . COMBINED KIDNEY-PANCREAS TRANSPLANT  2014  . ESOPHAGOGASTRODUODENOSCOPY N/A 12/30/2016   Procedure: ESOPHAGOGASTRODUODENOSCOPY (EGD);  Surgeon: Carol Ada, MD;  Location[facility] ENDOSCOPY;  Service: Endoscopy;  Laterality: N/A;    Family History  Problem Relation Age of Onset  . Diabetes Other   . Hypertension Other   . Hypertension Mother   . Diabetes Mother   . Kidney disease Mother   . Diabetes Maternal Grandmother   . Diabetes Paternal Grandmother   . Colon cancer Neg Hx   . Esophageal cancer Neg Hx   . Rectal cancer Neg Hx   . Stomach cancer Neg Hx     Social History   Tobacco Use  . Smoking status: Current Some Day Smoker  . Smokeless tobacco: Never Used  Substance Use Topics  . Alcohol use: No    Subjective:   I connected with Novella Rob on 01/27/20 at  1:00 PM EST by a video enabled telemedicine application and verified that I am speaking with the correct person using two identifiers.   I discussed  the limitations of evaluation and management by telemedicine and the availability of in person appointments. The patient expressed understanding and agreed to proceed. Provider in office/ patient is at home; provider and patient are only 2 people on video call.   Patient was hospitalized on 01/15/2019 with nausea, vomiting/ hematemesis and found to be COVID +. He was hospitalized from 1/21-1/25 and recommended to follow-up with GI within 2-4 weeks to discuss endoscopy and to re-start his prednisone and see his nephrologist as well. He is scheduled to see nephrologist soon and needs referral updated to [facility]. Also requesting updated order for prosthetic sleeve and  supplies; okay with having the order mailed to his home.  Notes he is doing well regarding COVID- very minimal lingering congestion but overall is back to baseline.    Objective:  There were no vitals filed for this visit.  General: Well developed, well nourished, in no acute distress  Lungs: Respirations unlabored; Neurologic: Alert and oriented; speech intact; face symmetrical;   Assessment:  1. Hematemesis, presence of nausea not specified   2. S/P bilateral BKA (below knee amputation) ([place])   3. COVID-19 virus detected     Plan:  1. Refer to GI- patient understands to keep this appointment; 2. Order for prosthetic supplies and liners updated; 3. Patient was diagnosed on 1/21- notes he has slight congestion but overall doing well with no concerns; is scheduled to see his nephrologist soon in follow-up- will let them updated needed labs;  Follow-up here as needed otherwise/ no specific follow-up set;   No follow-ups on file.  Orders Placed This Encounter  Procedures  . For home use only DME Other see comment    Prosthetic sleeves and supplies    Order Specific Question:   Length of Need    Answer:   12 Months  . Ambulatory referral to Gastroenterology    Referral Priority:   Routine    Referral Type:   Consultation     Referral Reason:   Specialty Services Required    Number of Visits Requested:   1    Requested Prescriptions    No prescriptions requested or ordered in this encounter

## 2020-01-30 DIAGNOSIS — H4720 Unspecified optic atrophy: Secondary | ICD-10-CM | POA: Diagnosis not present

## 2020-01-30 DIAGNOSIS — E103293 Type 1 diabetes mellitus with mild nonproliferative diabetic retinopathy without macular edema, bilateral: Secondary | ICD-10-CM | POA: Diagnosis not present

## 2020-01-30 DIAGNOSIS — Z961 Presence of intraocular lens: Secondary | ICD-10-CM | POA: Diagnosis not present

## 2020-01-31 DIAGNOSIS — Z79899 Other long term (current) drug therapy: Secondary | ICD-10-CM | POA: Diagnosis not present

## 2020-01-31 DIAGNOSIS — R109 Unspecified abdominal pain: Secondary | ICD-10-CM | POA: Diagnosis not present

## 2020-01-31 DIAGNOSIS — Z940 Kidney transplant status: Secondary | ICD-10-CM | POA: Diagnosis not present

## 2020-01-31 DIAGNOSIS — I129 Hypertensive chronic kidney disease with stage 1 through stage 4 chronic kidney disease, or unspecified chronic kidney disease: Secondary | ICD-10-CM | POA: Diagnosis not present

## 2020-02-04 DIAGNOSIS — Z8616 Personal history of COVID-19: Secondary | ICD-10-CM | POA: Diagnosis not present

## 2020-02-04 DIAGNOSIS — H5462 Unqualified visual loss, left eye, normal vision right eye: Secondary | ICD-10-CM | POA: Diagnosis not present

## 2020-02-04 DIAGNOSIS — Z961 Presence of intraocular lens: Secondary | ICD-10-CM | POA: Diagnosis not present

## 2020-02-04 DIAGNOSIS — E103293 Type 1 diabetes mellitus with mild nonproliferative diabetic retinopathy without macular edema, bilateral: Secondary | ICD-10-CM | POA: Diagnosis not present

## 2020-02-04 DIAGNOSIS — H5340 Unspecified visual field defects: Secondary | ICD-10-CM | POA: Diagnosis not present

## 2020-02-04 DIAGNOSIS — H4720 Unspecified optic atrophy: Secondary | ICD-10-CM | POA: Diagnosis not present

## 2020-02-05 DIAGNOSIS — M79606 Pain in leg, unspecified: Secondary | ICD-10-CM | POA: Diagnosis not present

## 2020-02-05 DIAGNOSIS — Z79891 Long term (current) use of opiate analgesic: Secondary | ICD-10-CM | POA: Diagnosis not present

## 2020-02-05 DIAGNOSIS — G894 Chronic pain syndrome: Secondary | ICD-10-CM | POA: Diagnosis not present

## 2020-02-05 DIAGNOSIS — M545 Low back pain: Secondary | ICD-10-CM | POA: Diagnosis not present

## 2020-02-06 DIAGNOSIS — Z79899 Other long term (current) drug therapy: Secondary | ICD-10-CM | POA: Diagnosis not present

## 2020-02-06 DIAGNOSIS — N183 Chronic kidney disease, stage 3 unspecified: Secondary | ICD-10-CM | POA: Diagnosis not present

## 2020-02-06 DIAGNOSIS — Z940 Kidney transplant status: Secondary | ICD-10-CM | POA: Diagnosis not present

## 2020-02-06 DIAGNOSIS — I129 Hypertensive chronic kidney disease with stage 1 through stage 4 chronic kidney disease, or unspecified chronic kidney disease: Secondary | ICD-10-CM | POA: Diagnosis not present

## 2020-02-06 DIAGNOSIS — D751 Secondary polycythemia: Secondary | ICD-10-CM | POA: Diagnosis not present

## 2020-02-10 ENCOUNTER — Telehealth: Payer: Self-pay | Admitting: Internal Medicine

## 2020-02-10 NOTE — Telephone Encounter (Signed)
Patient reported he called the wrong doctor's office.

## 2020-02-10 NOTE — Telephone Encounter (Signed)
Pt stated that he was "referred to [facility] to have a procedure done."  Pt requested to be referred to a [place] location.

## 2020-02-26 ENCOUNTER — Emergency Department (HOSPITAL_COMMUNITY): Payer: Medicare Other

## 2020-02-26 ENCOUNTER — Encounter (HOSPITAL_COMMUNITY): Payer: Self-pay | Admitting: Emergency Medicine

## 2020-02-26 ENCOUNTER — Other Ambulatory Visit: Payer: Self-pay

## 2020-02-26 ENCOUNTER — Emergency Department (HOSPITAL_COMMUNITY)
Admission: EM | Admit: 2020-02-26 | Discharge: 2020-02-26 | Disposition: A | Discharge status: Home / Self Care | Payer: Medicare Other | Attending: Emergency Medicine | Admitting: Emergency Medicine

## 2020-02-26 DIAGNOSIS — I120 Hypertensive chronic kidney disease with stage 5 chronic kidney disease or end stage renal disease: Secondary | ICD-10-CM | POA: Diagnosis not present

## 2020-02-26 DIAGNOSIS — K920 Hematemesis: Secondary | ICD-10-CM

## 2020-02-26 DIAGNOSIS — Z89512 Acquired absence of left leg below knee: Secondary | ICD-10-CM | POA: Diagnosis not present

## 2020-02-26 DIAGNOSIS — F1290 Cannabis use, unspecified, uncomplicated: Secondary | ICD-10-CM | POA: Diagnosis not present

## 2020-02-26 DIAGNOSIS — Z8616 Personal history of COVID-19: Secondary | ICD-10-CM | POA: Diagnosis not present

## 2020-02-26 DIAGNOSIS — Z89511 Acquired absence of right leg below knee: Secondary | ICD-10-CM | POA: Diagnosis not present

## 2020-02-26 DIAGNOSIS — Z720 Tobacco use: Secondary | ICD-10-CM | POA: Insufficient documentation

## 2020-02-26 DIAGNOSIS — E860 Dehydration: Secondary | ICD-10-CM | POA: Insufficient documentation

## 2020-02-26 DIAGNOSIS — Z79899 Other long term (current) drug therapy: Secondary | ICD-10-CM | POA: Diagnosis not present

## 2020-02-26 DIAGNOSIS — R110 Nausea: Secondary | ICD-10-CM | POA: Insufficient documentation

## 2020-02-26 DIAGNOSIS — Z992 Dependence on renal dialysis: Secondary | ICD-10-CM | POA: Diagnosis not present

## 2020-02-26 DIAGNOSIS — Z7982 Long term (current) use of aspirin: Secondary | ICD-10-CM | POA: Insufficient documentation

## 2020-02-26 DIAGNOSIS — N186 End stage renal disease: Secondary | ICD-10-CM | POA: Diagnosis not present

## 2020-02-26 DIAGNOSIS — I10 Essential (primary) hypertension: Secondary | ICD-10-CM | POA: Diagnosis not present

## 2020-02-26 DIAGNOSIS — N289 Disorder of kidney and ureter, unspecified: Secondary | ICD-10-CM | POA: Diagnosis not present

## 2020-02-26 DIAGNOSIS — Z9483 Pancreas transplant status: Secondary | ICD-10-CM | POA: Insufficient documentation

## 2020-02-26 LAB — COMPREHENSIVE METABOLIC PANEL
ALT: 26 U/L (ref 0–44)
AST: 26 U/L (ref 15–41)
Albumin: 4.1 g/dL (ref 3.5–5.0)
Alkaline Phosphatase: 93 U/L (ref 38–126)
Anion gap: 18 — ABNORMAL HIGH (ref 5–15)
BUN: 18 mg/dL (ref 6–20)
CO2: 18 mmol/L — ABNORMAL LOW (ref 22–32)
Calcium: 10 mg/dL (ref 8.9–10.3)
Chloride: 104 mmol/L (ref 98–111)
Creatinine, Ser: 1.82 mg/dL — ABNORMAL HIGH (ref 0.61–1.24)
GFR calc Af Amer: 51 mL/min — ABNORMAL LOW (ref >60)
GFR calc non Af Amer: 44 mL/min — ABNORMAL LOW (ref >60)
Glucose, Bld: 120 mg/dL — ABNORMAL HIGH (ref 70–99)
Potassium: 4.3 mmol/L (ref 3.5–5.1)
Sodium: 140 mmol/L (ref 135–145)
Total Bilirubin: 0.8 mg/dL (ref 0.3–1.2)
Total Protein: 7.9 g/dL (ref 6.5–8.1)

## 2020-02-26 LAB — CBC WITH DIFFERENTIAL/PLATELET
Abs Immature Granulocytes: 0.02 10*3/uL (ref 0.00–0.07)
Basophils Absolute: 0 10*3/uL (ref 0.0–0.1)
Basophils Relative: 1 %
Eosinophils Absolute: 0 10*3/uL (ref 0.0–0.5)
Eosinophils Relative: 0 %
HCT: 57.2 % — ABNORMAL HIGH (ref 39.0–52.0)
Hemoglobin: 18.3 g/dL — ABNORMAL HIGH (ref 13.0–17.0)
Immature Granulocytes: 0 %
Lymphocytes Relative: 20 %
Lymphs Abs: 1.4 10*3/uL (ref 0.7–4.0)
MCH: 30.8 pg (ref 26.0–34.0)
MCHC: 32 g/dL (ref 30.0–36.0)
MCV: 96.3 fL (ref 80.0–100.0)
Monocytes Absolute: 1 10*3/uL (ref 0.1–1.0)
Monocytes Relative: 14 %
Neutro Abs: 4.5 10*3/uL (ref 1.7–7.7)
Neutrophils Relative %: 65 %
Platelets: 271 10*3/uL (ref 150–400)
RBC: 5.94 MIL/uL — ABNORMAL HIGH (ref 4.22–5.81)
RDW: 14.8 % (ref 11.5–15.5)
WBC: 7 10*3/uL (ref 4.0–10.5)
nRBC: 0 % (ref 0.0–0.2)

## 2020-02-26 LAB — TYPE AND SCREEN
ABO/RH(D): O POS
Antibody Screen: NEGATIVE

## 2020-02-26 LAB — POC OCCULT BLOOD, ED: Fecal Occult Bld: NEGATIVE

## 2020-02-26 LAB — PROTIME-INR
INR: 0.9 (ref 0.8–1.2)
Prothrombin Time: 12.2 seconds (ref 11.4–15.2)

## 2020-02-26 MED ORDER — LACTATED RINGERS IV BOLUS
1000.0000 mL | Freq: Once | INTRAVENOUS | Status: AC
  Administered 2020-02-26: 1000 mL via INTRAVENOUS

## 2020-02-26 MED ORDER — SODIUM CHLORIDE 0.9 % IV BOLUS (SEPSIS)
1000.0000 mL | Freq: Once | INTRAVENOUS | Status: AC
  Administered 2020-02-26: 1000 mL via INTRAVENOUS

## 2020-02-26 MED ORDER — ONDANSETRON HCL 4 MG/2ML IJ SOLN
4.0000 mg | Freq: Once | INTRAMUSCULAR | Status: AC
  Administered 2020-02-26: 4 mg via INTRAVENOUS
  Filled 2020-02-26: qty 2

## 2020-02-26 MED ORDER — SODIUM CHLORIDE 0.9 % IV BOLUS (SEPSIS)
500.0000 mL | Freq: Once | INTRAVENOUS | Status: AC
  Administered 2020-02-26: 500 mL via INTRAVENOUS

## 2020-02-26 MED ORDER — PANTOPRAZOLE SODIUM 40 MG IV SOLR
40.0000 mg | Freq: Once | INTRAVENOUS | Status: AC
  Administered 2020-02-26: 04:00:00 40 mg via INTRAVENOUS
  Filled 2020-02-26: qty 40

## 2020-02-26 MED ORDER — METOCLOPRAMIDE HCL 5 MG/ML IJ SOLN
10.0000 mg | Freq: Once | INTRAMUSCULAR | Status: AC
  Administered 2020-02-26: 10 mg via INTRAVENOUS
  Filled 2020-02-26: qty 2

## 2020-02-26 MED ORDER — DIPHENHYDRAMINE HCL 50 MG/ML IJ SOLN
25.0000 mg | Freq: Once | INTRAMUSCULAR | Status: AC
  Administered 2020-02-26: 25 mg via INTRAVENOUS
  Filled 2020-02-26: qty 1

## 2020-02-26 NOTE — ED Notes (Signed)
Pt. Waiting for family to pick him up. ETA [id number].

## 2020-02-26 NOTE — ED Notes (Signed)
Patient denies pain and is resting comfortably.  

## 2020-02-26 NOTE — ED Notes (Signed)
Pt refusing PO fluids at this time. Will try again later.

## 2020-02-26 NOTE — ED Triage Notes (Signed)
Patient reports multiple bloody emesis yesterday with mid abdominal pain , denies diarrhea , no fever or chills .

## 2020-02-26 NOTE — ED Notes (Signed)
Pt came to the ED per triage complaint. Pt conscious, breathing, and A&Ox4. Pt brought back to bay [address] via wheelchair. Pt endorses "I have been having multiple blood vomits with mid abdominal pain". Chest rise and fall equally with non-labored breathing. Lungs clear apex to base. Abd soft and non-tender. Pt denies chest pain, n/v/d, shortness of breath, and f/c.PIVC placed on the LAC with a 20G which had positive blood return and flushed without pain or infiltration. Blood collected, labeled, and sent to lab. Bed in lowest position with call light within reach. Pt on continuous blood pressure, pulse ox, and cardiac monitor. Will continue to monitor. Awaiting MD eval. No distress noted.

## 2020-02-26 NOTE — ED Notes (Signed)
Pt tolerated PO challenge. IVF infusing per MAR. Pt care endorsed to [facility].

## 2020-02-26 NOTE — ED Provider Notes (Signed)
[facility] EMERGENCY DEPARTMENT Provider Note   CSN: [id number] Arrival date & time: 02/26/20  8185     History Chief Complaint  Patient presents with  . Hematemesis    [name] is a 44 y.o. male.  The history is provided by the patient.  Emesis Severity:  Severe Duration:  18 hours Timing:  Intermittent Quality:  Bright red blood Progression:  Worsening Chronicity:  Recurrent Relieved by:  Nothing Worsened by:  Liquids Associated symptoms: abdominal pain and chills   Associated symptoms: no diarrhea and no fever   Risk factors: diabetes   Patient with extensive medical history including renal transplant, gastroparesis, diabetes, bilateral BKA presents with nausea vomiting.  He reports over the past 18 hours he has had multiple episodes of bloody emesis.  He also has abdominal pain for vomiting.  No chest pain but does report shortness of breath.  No fevers. This is similar to prior episodes. He was admitted in January for similar episodes. He was COVID-19 positive in January and has had a full recovery Denies any black or bloody stool     Past Medical History:  Diagnosis Date  . 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 Arthritis    "I think I do; just in my fingers & my hands"  . Blood transfusion   . Cataract   . Chronic pain   . Depression    Patient states he has never been depressed.  . Diabetes mellitus without complication ([place])    no since pancreas transplant  . Dialysis patient [facility]) 04/18/12   "[facility]; Tues, [place], [place]"  . Edema   . ESRD (end stage renal disease) on dialysis ([place])   . Gastroparesis   . Gastropathy   . GERD (gastroesophageal reflux disease)   . Headache(784.0)   . Hypertension     Patient Active Problem List   Diagnosis Date Noted  . GI bleed 01/17/2020  . Acute kidney injury due to COVID-19 ([place]) 01/16/2020  . Dehydration 01/16/2020  . Gastroenteritis due to COVID-19 virus  01/16/2020  . Olecranon bursitis of left elbow 09/16/2018  . CKD (chronic kidney disease), stage III 10/26/2017  . Abnormal EKG 10/26/2017  . Hypertension 08/22/2017  . Acute GI bleeding 07/17/2017  . Hypertensive urgency 12/29/2016  . Hematemesis   . Healthcare maintenance 08/26/2016  . Back pain 12/15/2015  . History of diabetes mellitus 02/20/2015  . Abdominal pain 09/10/2014  . Weakness of left upper extremity 07/03/2014  . Impaired mobility and activities of daily living 07/03/2014  . Sternoclavicular joint pain 03/04/2014  . Renal transplant, status post 07/19/2013  . History of pancreas transplant ([place]) 07/19/2013  . Immunosuppressed status ([place]) 06/27/2013  . H/O pancreas transplant ([place]) 06/27/2013  . Diabetic gastroparesis associated with type 1 diabetes mellitus ([place]) 05/08/2013  . BKA stump complication ([place]) 63/14/9702  . S/P bilateral BKA (below knee amputation) ([place]) 08/17/2012  . Metabolic bone disease 63/78/5885  . ESRD (end stage renal disease) ([place]) 11/17/2009  . GERD 03/14/2007  . HLD (hyperlipidemia) 02/22/2007  . Major depressive disorder, recurrent episode ([place]) 02/22/2007  . POST TRAUMATIC STRESS DISORDER 02/22/2007  . 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 AV FISTULA PLACEMENT  08/2011   left upper arm  . BELOW KNEE LEG AMPUTATION  "it's been awhile"   bilaterally  . CATARACT EXTRACTION  ~ 2011   right  . COMBINED KIDNEY-PANCREAS TRANSPLANT  2014  .  ESOPHAGOGASTRODUODENOSCOPY N/A 12/30/2016   Procedure: ESOPHAGOGASTRODUODENOSCOPY (EGD);  Surgeon: Carol Ada, MD;  Location: [facility] ENDOSCOPY;  Service: Endoscopy;  Laterality: N/A;       Family History  Problem Relation Age of Onset  . Diabetes Other   . Hypertension Other   . Hypertension Mother   . Diabetes Mother   . Kidney disease Mother   . Diabetes Maternal Grandmother   . Diabetes Paternal Grandmother   . Colon cancer Neg Hx   . Esophageal  cancer Neg Hx   . Rectal cancer Neg Hx   . Stomach cancer Neg Hx     Social History   Tobacco Use  . Smoking status: Current Some Day Smoker  . Smokeless tobacco: Never Used  Substance Use Topics  . Alcohol use: No  . Drug use: Yes    Frequency: 3.0 times per week    Types: Marijuana    Comment: Occasionally    Home Medications Prior to Admission medications   Medication Sig Start Date End Date Taking? Authorizing Provider  aspirin EC 81 MG tablet Take 81 mg by mouth daily.    [provider]  augmented betamethasone dipropionate (DIPROLENE-AF) 0.05 % ointment Apply 1 application topically 2 (two) times daily as needed. 08/20/19   [provider]  cloNIDine (CATAPRES) 0.1 MG tablet Take 1 tablet (0.1 mg total) by mouth 2 (two) times daily. 01/20/20   Amin, Jeanella Flattery, MD  famotidine (PEPCID) 20 MG tablet Take 1 tablet (20 mg total) by mouth 2 (two) times daily. 01/20/20 03/20/20  Damita Lack, MD  hydrALAZINE (APRESOLINE) 50 MG tablet Take 1 tablet (50 mg total) by mouth 3 (three) times daily. Patient taking differently: Take 75 mg by mouth 2 (two) times daily.  10/28/17   Kayleen Memos, DO  Ipratropium-Albuterol (COMBIVENT) 20-100 MCG/ACT AERS respimat Inhale 1 puff into the lungs every 6 (six) hours as needed for wheezing or shortness of breath. 01/20/20   Amin, Jeanella Flattery, MD  labetalol (NORMODYNE) 100 MG tablet Take 1 tablet (100 mg total) by mouth 2 (two) times daily. 10/28/17   Kayleen Memos, DO  labetalol (NORMODYNE) 200 MG tablet TK 1 T PO BID 08/06/18   [provider]  metoCLOPramide (REGLAN) 10 MG tablet Take 1 tablet (10 mg total) by mouth 4 (four) times daily. Take 1tab 20-30 minutes before breakfast, lunch, dinner and at bedtime 03/01/17   Levin Erp, [place]  mycophenolate (MYFORTIC) 180 MG EC tablet Take 360 mg by mouth 2 (two) times daily.  07/06/13   [provider]  omeprazole (PRILOSEC) 40 MG capsule Take 1 capsule (40 mg  total) by mouth 2 (two) times daily before a meal. 01/20/20   Amin, Jeanella Flattery, MD  ondansetron (ZOFRAN ODT) 4 MG disintegrating tablet Take 1 tablet (4 mg total) by mouth every 8 (eight) hours as needed for nausea or vomiting. Patient not taking: Reported on 01/16/2020 01/08/15   Gareth Morgan, MD  ondansetron (ZOFRAN) 4 MG tablet TK 1 T PO BID PRN 01/21/19   [provider]  OxyCODONE HCl 15 MG TABA Take 15 mg by mouth 4 (four) times daily.    [provider]  predniSONE (DELTASONE) 5 MG tablet Take 5 mg by mouth daily.  06/20/13   [provider]  promethazine (PHENERGAN) 25 MG tablet Take 25 mg by mouth every 6 (six) hours as needed for nausea or vomiting.    [provider]  saccharomyces boulardii (FLORASTOR) 250 MG  capsule Take 250 mg by mouth 2 (two) times daily.    [provider]  sucralfate (CARAFATE) 1 GM/10ML suspension Take 10 mLs (1 g total) by mouth 4 (four) times daily -  with meals and at bedtime for 21 days. 01/20/20 02/10/20  Damita Lack, MD  sulfamethoxazole-trimethoprim (BACTRIM,SEPTRA) 400-80 MG per tablet Take 1 tablet by mouth every Monday, Wednesday, and Friday.     [provider]  tacrolimus (PROGRAF) 1 MG capsule Take 2-3 mg by mouth 2 (two) times daily. 3 mg in the morning, 2 mg in the evening 07/16/13   [provider]    Allergies    Lisinopril and Amlodipine  Review of Systems   Review of Systems  Constitutional: Positive for chills. Negative for fever.  Respiratory: Positive for shortness of breath.   Cardiovascular: Negative for chest pain.  Gastrointestinal: Positive for abdominal pain and vomiting. Negative for blood in stool and diarrhea.  All other systems reviewed and are negative.   Physical Exam Updated Vital Signs BP (!) 151/91 (BP Location: Right Arm)   Pulse (!) 110   Temp 98.1 F (36.7 C) (Oral)   Resp 20   Ht 1.803 m (5\' 11" )   Wt 90 kg   SpO2 100%   BMI 27.67 kg/m    Physical Exam CONSTITUTIONAL: Chronically ill-appearing, lying on left side actively vomiting HEAD: Normocephalic/atraumatic EYES: EOMI/PERRL, no icterus ENMT: Mucous membranes dry, dark material on tongue NECK: supple no meningeal signs SPINE/BACK:entire spine nontender CV: S1/S2 noted, no murmurs/rubs/gallops noted LUNGS: Lungs are clear to auscultation bilaterally, no apparent distress ABDOMEN: soft, nontender, no rebound or guarding, bowel sounds noted throughout abdomen Rectal-no blood or melena, chaperone present GU:no cva tenderness NEURO: Pt is awake/alert/appropriate, moves all extremitiesx4.  No facial droop.   EXTREMITIES: Patient with evidence of bilateral BKA SKIN: warm, color normal PSYCH: Mildly anxious  ED Results / Procedures / Treatments   Labs (all labs ordered are listed, but only abnormal results are displayed) Labs Reviewed  CBC WITH DIFFERENTIAL/PLATELET - Abnormal; Notable for the following components:      Result Value   RBC 5.94 (*)    Hemoglobin 18.3 (*)    HCT 57.2 (*)    All other components within normal limits  COMPREHENSIVE METABOLIC PANEL - Abnormal; Notable for the following components:   CO2 18 (*)    Glucose, Bld 120 (*)    Creatinine, Ser 1.82 (*)    GFR calc non Af Amer 44 (*)    GFR calc Af Amer 51 (*)    Anion gap 18 (*)    All other components within normal limits  PROTIME-INR  POC OCCULT BLOOD, ED  TYPE AND SCREEN    EKG EKG Interpretation  Date/Time:  Wednesday February 26 2020 04:05:26 EST Ventricular Rate:  93 PR Interval:    QRS Duration: 82 QT Interval:  363 QTC Calculation: 452 R Axis:   118 Text Interpretation: Sinus rhythm Consider right atrial enlargement Lateral infarct, age indeterminate Anteroseptal infarct, old Abnormal T, consider ischemia, inferior leads Baseline wander in lead(s) V2 Confirmed by Ripley Fraise [phone]) on 02/26/2020 4:09:43 AM   Radiology DG Chest Port 1 View  Result Date:  02/26/2020 CLINICAL DATA:  Bloody emesis EXAM: PORTABLE CHEST 1 VIEW COMPARISON:  12/29/2016 FINDINGS: The heart size and mediastinal contours are within normal limits. Both lungs are clear. The visualized skeletal structures are unremarkable. IMPRESSION: No active disease. Electronically Signed   By: Linus Mako.D.  On: 02/26/2020 03:32    Procedures Procedures   Medications Ordered in ED Medications  sodium chloride 0.9 % bolus 500 mL (has no administration in time range)  metoCLOPramide (REGLAN) injection 10 mg (10 mg Intravenous Given 02/26/20 0330)  pantoprazole (PROTONIX) injection 40 mg (40 mg Intravenous Given 02/26/20 0330)  diphenhydrAMINE (BENADRYL) injection 25 mg (25 mg Intravenous Given 02/26/20 0333)  sodium chloride 0.9 % bolus 1,000 mL (0 mLs Intravenous Stopped 02/26/20 0510)  lactated ringers bolus 1,000 mL (0 mLs Intravenous Stopped 02/26/20 0542)  ondansetron (ZOFRAN) injection 4 mg (4 mg Intravenous Given 02/26/20 0640)    ED Course  I have reviewed the triage vital signs and the nursing notes.  Pertinent labs & imaging results that were available during my care of the patient were reviewed by me and considered in my medical decision making (see chart for details).    MDM Rules/Calculators/A&P                      3:11 AM Patient with extensive history including gastroparesis presents for intractable nausea and vomiting.  Patient has had hematemesis at home.  He appears uncomfortable and is actively vomiting on exam Patient was admitted in January 2021 w/similar episode. He did not undergo endoscopy at that time due to active COVID-19 infection.  Gastroenterology theorized the patient had recurrent esophagitis with possible Mallory-Weiss tear Plan to treat nausea/vomiting, check labs and reassess Currently his abdominal exam is unremarkable, will defer imaging EGD from 2018 negative 4:02 AM Patient reports he is starting to feel improved.  Abdomen is soft to palpation.   He appears to be very dehydrated by labs.  We will give IV fluid 4:39 AM Will try PO challenge 5:02 AM Pt resting comfortably, will monitor and give another liter of IV fluids 7:06 AM Patient is feeling improved and he is trialing p.o. fluids. He has had urine output. He denies any pain at this time. Per records, he has follow-up with gastroenterology on March 16 He reports compliance with his Reglan. No signs of acute GI bleed at this time. Patient requests another bag of IV fluids and will be discharged. Final Clinical Impression(s) / ED Diagnoses Final diagnoses:  Hematemesis with nausea  Dehydration  Renal insufficiency    Rx / DC Orders ED Discharge Orders    None       Ripley Fraise, MD 02/26/20 [phone]

## 2020-02-26 NOTE — ED Notes (Signed)
500 mL IV fluids completed. Pt. Tolerated well.

## 2020-02-27 DIAGNOSIS — H4720 Unspecified optic atrophy: Secondary | ICD-10-CM | POA: Diagnosis not present

## 2020-03-04 DIAGNOSIS — M545 Low back pain: Secondary | ICD-10-CM | POA: Diagnosis not present

## 2020-03-04 DIAGNOSIS — Z79891 Long term (current) use of opiate analgesic: Secondary | ICD-10-CM | POA: Diagnosis not present

## 2020-03-04 DIAGNOSIS — M79606 Pain in leg, unspecified: Secondary | ICD-10-CM | POA: Diagnosis not present

## 2020-03-04 DIAGNOSIS — G894 Chronic pain syndrome: Secondary | ICD-10-CM | POA: Diagnosis not present

## 2020-03-09 ENCOUNTER — Telehealth: Payer: Self-pay

## 2020-03-09 ENCOUNTER — Other Ambulatory Visit: Payer: Self-pay | Admitting: Family

## 2020-03-09 DIAGNOSIS — Z89511 Acquired absence of right leg below knee: Secondary | ICD-10-CM

## 2020-03-09 NOTE — Telephone Encounter (Signed)
New message    The patient verbalized he lost his prescriptions for his Prosthetic liner supplies.    Asking for another prescription    Will come off to pick up the prescription.

## 2020-03-10 ENCOUNTER — Ambulatory Visit: Payer: Medicare Other | Admitting: Internal Medicine

## 2020-03-11 DIAGNOSIS — H5462 Unqualified visual loss, left eye, normal vision right eye: Secondary | ICD-10-CM | POA: Diagnosis not present

## 2020-03-11 DIAGNOSIS — Z961 Presence of intraocular lens: Secondary | ICD-10-CM | POA: Diagnosis not present

## 2020-03-11 DIAGNOSIS — H4720 Unspecified optic atrophy: Secondary | ICD-10-CM | POA: Diagnosis not present

## 2020-03-11 DIAGNOSIS — E103293 Type 1 diabetes mellitus with mild nonproliferative diabetic retinopathy without macular edema, bilateral: Secondary | ICD-10-CM | POA: Diagnosis not present

## 2020-03-13 ENCOUNTER — Ambulatory Visit: Payer: Medicare Other | Admitting: Family Medicine

## 2020-03-13 ENCOUNTER — Ambulatory Visit: Payer: Medicare Other | Admitting: Family

## 2020-03-13 NOTE — Progress Notes (Deleted)
    Subjective:    CC: R elbow pain and swelling  I, Ragen Laver, LAT, ATC, am serving as scribe for Dr. Lynne Leader.  HPI: Pt is a 44 y/o male presenting w/ c/o R elbow pain and swelling x .  He rates his pain at a /10 and describes his pain as .  Radiating pain: R elbow swelling: yes R UE numbness/tingling: R UE weakness: Aggravating factors: Treatments tried:  Pertinent review of Systems: ***  Relevant historical information: ***   Objective:   There were no vitals filed for this visit. General: Well Developed, well nourished, and in no acute distress.   MSK: ***  Lab and Radiology Results No results found for this or any previous visit (from the past 72 hour(s)). No results found.    Impression and Recommendations:    Assessment and Plan: 44 y.o. male with ***.  PDMP not reviewed this encounter. No orders of the defined types were placed in this encounter.  No orders of the defined types were placed in this encounter.   Discussed warning signs or symptoms. Please see discharge instructions. Patient expresses understanding.   ***

## 2020-03-31 DIAGNOSIS — I10 Essential (primary) hypertension: Secondary | ICD-10-CM | POA: Diagnosis not present

## 2020-03-31 DIAGNOSIS — D849 Immunodeficiency, unspecified: Secondary | ICD-10-CM | POA: Diagnosis not present

## 2020-03-31 DIAGNOSIS — E103293 Type 1 diabetes mellitus with mild nonproliferative diabetic retinopathy without macular edema, bilateral: Secondary | ICD-10-CM | POA: Diagnosis not present

## 2020-03-31 DIAGNOSIS — Z9483 Pancreas transplant status: Secondary | ICD-10-CM | POA: Diagnosis not present

## 2020-03-31 DIAGNOSIS — Z79899 Other long term (current) drug therapy: Secondary | ICD-10-CM | POA: Diagnosis not present

## 2020-03-31 DIAGNOSIS — Z792 Long term (current) use of antibiotics: Secondary | ICD-10-CM | POA: Diagnosis not present

## 2020-03-31 DIAGNOSIS — K3184 Gastroparesis: Secondary | ICD-10-CM | POA: Diagnosis not present

## 2020-03-31 DIAGNOSIS — E1043 Type 1 diabetes mellitus with diabetic autonomic (poly)neuropathy: Secondary | ICD-10-CM | POA: Diagnosis not present

## 2020-03-31 DIAGNOSIS — Z4822 Encounter for aftercare following kidney transplant: Secondary | ICD-10-CM | POA: Diagnosis not present

## 2020-03-31 DIAGNOSIS — E1042 Type 1 diabetes mellitus with diabetic polyneuropathy: Secondary | ICD-10-CM | POA: Diagnosis not present

## 2020-03-31 DIAGNOSIS — Z87891 Personal history of nicotine dependence: Secondary | ICD-10-CM | POA: Diagnosis not present

## 2020-04-02 ENCOUNTER — Ambulatory Visit: Payer: Medicare Other | Admitting: Family Medicine

## 2020-04-02 ENCOUNTER — Ambulatory Visit: Payer: Medicare Other | Admitting: Internal Medicine

## 2020-04-02 NOTE — Progress Notes (Deleted)
    Subjective:    CC: R elbow pain  I, [name], LAT, ATC, am serving as scribe for Dr. Lynne Leader.  HPI: Pt is a 44 y/o male presenting w/ c/o R elbow pain and swelling x .  He rates his pain as and describes his pain as .  Radiating pain: R elbow pain: Yes Aggravating factors: Treatments tried:  Pertinent review of Systems: ***  Relevant historical information: ***   Objective:   There were no vitals filed for this visit. General: Well Developed, well nourished, and in no acute distress.   MSK: ***  Lab and Radiology Results No results found for this or any previous visit (from the past 72 hour(s)). No results found.    Impression and Recommendations:    Assessment and Plan: 44 y.o. male with ***.  PDMP not reviewed this encounter. No orders of the defined types were placed in this encounter.  No orders of the defined types were placed in this encounter.   Discussed warning signs or symptoms. Please see discharge instructions. Patient expresses understanding.   ***

## 2020-04-06 ENCOUNTER — Encounter: Payer: Self-pay | Admitting: Family

## 2020-04-07 DIAGNOSIS — Z48288 Encounter for aftercare following multiple organ transplant: Secondary | ICD-10-CM | POA: Diagnosis not present

## 2020-04-07 DIAGNOSIS — Z9483 Pancreas transplant status: Secondary | ICD-10-CM | POA: Diagnosis not present

## 2020-04-07 DIAGNOSIS — E139 Other specified diabetes mellitus without complications: Secondary | ICD-10-CM | POA: Diagnosis not present

## 2020-04-07 DIAGNOSIS — Z79899 Other long term (current) drug therapy: Secondary | ICD-10-CM | POA: Diagnosis not present

## 2020-04-07 DIAGNOSIS — Z87891 Personal history of nicotine dependence: Secondary | ICD-10-CM | POA: Diagnosis not present

## 2020-04-07 DIAGNOSIS — I10 Essential (primary) hypertension: Secondary | ICD-10-CM | POA: Diagnosis not present

## 2020-04-07 DIAGNOSIS — H539 Unspecified visual disturbance: Secondary | ICD-10-CM | POA: Diagnosis not present

## 2020-04-07 DIAGNOSIS — Z7952 Long term (current) use of systemic steroids: Secondary | ICD-10-CM | POA: Diagnosis not present

## 2020-04-07 DIAGNOSIS — Z940 Kidney transplant status: Secondary | ICD-10-CM | POA: Diagnosis not present

## 2020-04-07 DIAGNOSIS — E103293 Type 1 diabetes mellitus with mild nonproliferative diabetic retinopathy without macular edema, bilateral: Secondary | ICD-10-CM | POA: Diagnosis not present

## 2020-04-07 DIAGNOSIS — D849 Immunodeficiency, unspecified: Secondary | ICD-10-CM | POA: Diagnosis not present

## 2020-04-07 DIAGNOSIS — Z89512 Acquired absence of left leg below knee: Secondary | ICD-10-CM | POA: Diagnosis not present

## 2020-04-07 DIAGNOSIS — E891 Postprocedural hypoinsulinemia: Secondary | ICD-10-CM | POA: Diagnosis not present

## 2020-04-07 DIAGNOSIS — Z792 Long term (current) use of antibiotics: Secondary | ICD-10-CM | POA: Diagnosis not present

## 2020-04-07 DIAGNOSIS — K3184 Gastroparesis: Secondary | ICD-10-CM | POA: Diagnosis not present

## 2020-04-08 DIAGNOSIS — G894 Chronic pain syndrome: Secondary | ICD-10-CM | POA: Diagnosis not present

## 2020-04-08 DIAGNOSIS — M79606 Pain in leg, unspecified: Secondary | ICD-10-CM | POA: Diagnosis not present

## 2020-04-08 DIAGNOSIS — Z79891 Long term (current) use of opiate analgesic: Secondary | ICD-10-CM | POA: Diagnosis not present

## 2020-04-08 DIAGNOSIS — M545 Low back pain: Secondary | ICD-10-CM | POA: Diagnosis not present

## 2020-04-09 ENCOUNTER — Ambulatory Visit: Payer: Self-pay

## 2020-04-09 ENCOUNTER — Encounter: Payer: Self-pay | Admitting: Family Medicine

## 2020-04-09 ENCOUNTER — Other Ambulatory Visit: Payer: Self-pay

## 2020-04-09 ENCOUNTER — Ambulatory Visit (INDEPENDENT_AMBULATORY_CARE_PROVIDER_SITE_OTHER): Payer: Medicare Other | Admitting: Family Medicine

## 2020-04-09 ENCOUNTER — Ambulatory Visit: Payer: Medicare Other

## 2020-04-09 VITALS — BP 118/80 | HR 85 | Ht 71.0 in | Wt 186.0 lb

## 2020-04-09 DIAGNOSIS — Z940 Kidney transplant status: Secondary | ICD-10-CM

## 2020-04-09 DIAGNOSIS — Z89512 Acquired absence of left leg below knee: Secondary | ICD-10-CM

## 2020-04-09 DIAGNOSIS — M7021 Olecranon bursitis, right elbow: Secondary | ICD-10-CM

## 2020-04-09 DIAGNOSIS — Z9483 Pancreas transplant status: Secondary | ICD-10-CM

## 2020-04-09 DIAGNOSIS — Z89511 Acquired absence of right leg below knee: Secondary | ICD-10-CM

## 2020-04-09 DIAGNOSIS — M25521 Pain in right elbow: Secondary | ICD-10-CM | POA: Diagnosis not present

## 2020-04-09 NOTE — Patient Instructions (Addendum)
You had an elbow aspiration and injection today. Call or go to the ER if you develop a large red swollen joint with extreme pain or oozing puss.   It is important to keep this padded and compressed as much as possible over the next few weeks.   I recommend you obtained a compression sleeve to help with your joint problems. There are many options on the market however I recommend obtaining a elbow Body Helix compression sleeve.  You can find information (including how to appropriate measure yourself for sizing) can be found at www.Body http://www.lambert.com/.  Many of these products are health savings account (HSA) eligible.   You can use the compression sleeve at any time throughout the day but is most important to use while being active as well as for 2 hours post-activity.   It is appropriate to ice following activity with the compression sleeve in place.

## 2020-04-09 NOTE — Progress Notes (Signed)
Subjective:    CC: R elbow Fluid filled sac  I, Judy Pimple, am serving as a scribe for Dr. Lynne Leader.  HPI: Pt is a 44 y/o male presenting w/ c/o R elbow pain going on a few weeks .  He locates his pain to R elbow, no pain more bothersome as it is a fluid filled sac right on the elbow, patient states he has had this before about a year and a half ago and it was drained.   He has a history of bilateral below the knee amputation using prosthetics.  He walks around pretty well but does note that he tends to put pressure on his elbow when moving around at home.  He does not recall any injury to explain the swelling.  No fevers or chills.  Additionally history significant for pancreas and kidney transplant about 8 years ago.  He has a history of type 1 diabetes and kidney failure requiring dialysis.  Over the last 2 years he has done well without any blood sugar elevation suggestive of diabetes or significant change in kidney disease.  Radiating pain: no R elbow swelling: no R elbow mechanical symptoms: no Aggravating factors: resting elbow on something Treatments tried: none   Pertinent review of Systems: No fevers or chills  Relevant historical information: As above.  History prior olecranon bursitis and below the knee amputation as well as kidney and pancreas transplant   Objective:    Vitals:   04/09/20 1308  BP: 118/80  Pulse: 85  SpO2: 97%   General: Well Developed, well nourished, and in no acute distress.   MSK: Right elbow swollen overlying greater trochanter.  No skin erythema or tenderness. Normal elbow motion.  Lab and Radiology Results  Diagnostic Limited MSK Ultrasound of: Right elbow olecranon bursa Fluid-filled cystic structure overlying olecranon consistent with olecranon bursitis.  No abnormal appearance bony surface Impression: Olecranon bursitis  Aspiration and injection of right elbow olecranon bursitis: Consent obtained and timeout  performed. Skin cleaned with rubbing alcohol applied and 2 mL of lidocaine injected subcutaneously and into bursa achieving good anesthesia. Skin again cleaned with rubbing alcohol and an 18-gauge needle was used access the bursa. 19 mL of cloudy bloody fluid aspirated. Syringe exchanged and 40 mg of Kenalog and 1 mL of Marcaine injected. Bandage and Ace wrap applied. Patient tolerated procedure well.  Impression and Recommendations:    Assessment and Plan: 44 y.o. male with right elbow olecranon bursitis.  Patient denies any trauma history.  He does tend to put more pressure through his elbow is in the average person given his history of bilateral BKA.  I was a bit surprised by the appearance of the fluid on aspirate.  We will send for culture.  Stressed the importance of compression and padding.  Recommend body helix elbow sleeve.  Recheck back with me as needed..   Orders Placed This Encounter  Procedures  . Anaerobic and Aerobic Culture    Source right olecranon bursitis    Standing Status:   Future    Standing Expiration Date:   04/09/2021  . DG ELBOW COMPLETE RIGHT (3+VIEW)    Standing Status:   Future    Number of Occurrences:   1    Standing Expiration Date:   06/09/2021    Order Specific Question:   Reason for Exam (SYMPTOM  OR DIAGNOSIS REQUIRED)    Answer:   eval rt elbow pain. BL BKA    Order Specific Question:  Preferred imaging location?    Answer:   [facility]    Order Specific Question:   Radiology Contrast Protocol - do NOT remove file path    Answer:   [email or web address]  . [place] LIMITED JOINT SPACE STRUCTURES UP RIGHT(NO LINKED CHARGES)    Order Specific Question:   Reason for Exam (SYMPTOM  OR DIAGNOSIS REQUIRED)    Answer:   eval right elbow swelling    Order Specific Question:   Preferred imaging location?    Answer:   [facility]   No orders of the defined types were placed in this  encounter.   Discussed warning signs or symptoms. Please see discharge instructions. Patient expresses understanding.   The above documentation has been reviewed and is accurate and complete Lynne Leader

## 2020-04-09 NOTE — Addendum Note (Signed)
Addended by: Cresenciano Lick on: 04/09/2020 01:49 PM   Modules accepted: Orders

## 2020-04-13 NOTE — Progress Notes (Signed)
Culture is negative so far.  Final culture result is still pending

## 2020-04-15 LAB — ANAEROBIC AND AEROBIC CULTURE
AER RESULT:: NO GROWTH
MICRO NUMBER:: 10367866
MICRO NUMBER:: 10367867
SPECIMEN QUALITY:: ADEQUATE
SPECIMEN QUALITY:: ADEQUATE

## 2020-04-15 NOTE — Progress Notes (Signed)
Culture final is negative.

## 2020-04-21 DIAGNOSIS — Z89619 Acquired absence of unspecified leg above knee: Secondary | ICD-10-CM | POA: Diagnosis not present

## 2020-04-21 DIAGNOSIS — I10 Essential (primary) hypertension: Secondary | ICD-10-CM | POA: Diagnosis not present

## 2020-04-21 DIAGNOSIS — Z940 Kidney transplant status: Secondary | ICD-10-CM | POA: Diagnosis not present

## 2020-04-21 DIAGNOSIS — K3184 Gastroparesis: Secondary | ICD-10-CM | POA: Diagnosis not present

## 2020-04-21 DIAGNOSIS — H4720 Unspecified optic atrophy: Secondary | ICD-10-CM | POA: Diagnosis not present

## 2020-04-21 DIAGNOSIS — Z48288 Encounter for aftercare following multiple organ transplant: Secondary | ICD-10-CM | POA: Diagnosis not present

## 2020-04-21 DIAGNOSIS — E109 Type 1 diabetes mellitus without complications: Secondary | ICD-10-CM | POA: Diagnosis not present

## 2020-04-21 DIAGNOSIS — Z9483 Pancreas transplant status: Secondary | ICD-10-CM | POA: Diagnosis not present

## 2020-04-21 DIAGNOSIS — Z79899 Other long term (current) drug therapy: Secondary | ICD-10-CM | POA: Diagnosis not present

## 2020-04-21 DIAGNOSIS — R718 Other abnormality of red blood cells: Secondary | ICD-10-CM | POA: Diagnosis not present

## 2020-04-21 DIAGNOSIS — Z792 Long term (current) use of antibiotics: Secondary | ICD-10-CM | POA: Diagnosis not present

## 2020-04-21 DIAGNOSIS — Z7952 Long term (current) use of systemic steroids: Secondary | ICD-10-CM | POA: Diagnosis not present

## 2020-04-21 DIAGNOSIS — Z87891 Personal history of nicotine dependence: Secondary | ICD-10-CM | POA: Diagnosis not present

## 2020-05-01 DIAGNOSIS — Z940 Kidney transplant status: Secondary | ICD-10-CM | POA: Diagnosis not present

## 2020-05-01 DIAGNOSIS — Z79899 Other long term (current) drug therapy: Secondary | ICD-10-CM | POA: Diagnosis not present

## 2020-05-01 DIAGNOSIS — N183 Chronic kidney disease, stage 3 unspecified: Secondary | ICD-10-CM | POA: Diagnosis not present

## 2020-05-01 DIAGNOSIS — D751 Secondary polycythemia: Secondary | ICD-10-CM | POA: Diagnosis not present

## 2020-05-01 DIAGNOSIS — I129 Hypertensive chronic kidney disease with stage 1 through stage 4 chronic kidney disease, or unspecified chronic kidney disease: Secondary | ICD-10-CM | POA: Diagnosis not present

## 2020-05-06 DIAGNOSIS — G894 Chronic pain syndrome: Secondary | ICD-10-CM | POA: Diagnosis not present

## 2020-05-06 DIAGNOSIS — M545 Low back pain: Secondary | ICD-10-CM | POA: Diagnosis not present

## 2020-05-06 DIAGNOSIS — Z79891 Long term (current) use of opiate analgesic: Secondary | ICD-10-CM | POA: Diagnosis not present

## 2020-05-06 DIAGNOSIS — M79606 Pain in leg, unspecified: Secondary | ICD-10-CM | POA: Diagnosis not present

## 2020-05-08 DIAGNOSIS — K3184 Gastroparesis: Secondary | ICD-10-CM | POA: Diagnosis not present

## 2020-05-08 DIAGNOSIS — Z48288 Encounter for aftercare following multiple organ transplant: Secondary | ICD-10-CM | POA: Diagnosis not present

## 2020-05-08 DIAGNOSIS — Z79899 Other long term (current) drug therapy: Secondary | ICD-10-CM | POA: Diagnosis not present

## 2020-05-08 DIAGNOSIS — Z7952 Long term (current) use of systemic steroids: Secondary | ICD-10-CM | POA: Diagnosis not present

## 2020-05-08 DIAGNOSIS — I10 Essential (primary) hypertension: Secondary | ICD-10-CM | POA: Diagnosis not present

## 2020-05-08 DIAGNOSIS — Z87891 Personal history of nicotine dependence: Secondary | ICD-10-CM | POA: Diagnosis not present

## 2020-05-08 DIAGNOSIS — Z940 Kidney transplant status: Secondary | ICD-10-CM | POA: Diagnosis not present

## 2020-05-08 DIAGNOSIS — K219 Gastro-esophageal reflux disease without esophagitis: Secondary | ICD-10-CM | POA: Diagnosis not present

## 2020-05-08 DIAGNOSIS — R718 Other abnormality of red blood cells: Secondary | ICD-10-CM | POA: Diagnosis not present

## 2020-05-08 DIAGNOSIS — E109 Type 1 diabetes mellitus without complications: Secondary | ICD-10-CM | POA: Diagnosis not present

## 2020-05-08 DIAGNOSIS — Z9483 Pancreas transplant status: Secondary | ICD-10-CM | POA: Diagnosis not present

## 2020-05-08 DIAGNOSIS — Z5181 Encounter for therapeutic drug level monitoring: Secondary | ICD-10-CM | POA: Diagnosis not present

## 2020-05-08 DIAGNOSIS — Z792 Long term (current) use of antibiotics: Secondary | ICD-10-CM | POA: Diagnosis not present

## 2020-05-11 DIAGNOSIS — E875 Hyperkalemia: Secondary | ICD-10-CM | POA: Diagnosis not present

## 2020-05-11 DIAGNOSIS — Z940 Kidney transplant status: Secondary | ICD-10-CM | POA: Diagnosis not present

## 2020-05-18 ENCOUNTER — Ambulatory Visit: Payer: Self-pay

## 2020-05-18 ENCOUNTER — Other Ambulatory Visit: Payer: Self-pay

## 2020-05-18 ENCOUNTER — Encounter: Payer: Self-pay | Admitting: Family Medicine

## 2020-05-18 ENCOUNTER — Ambulatory Visit (INDEPENDENT_AMBULATORY_CARE_PROVIDER_SITE_OTHER): Payer: Medicare Other | Admitting: Family Medicine

## 2020-05-18 VITALS — BP 140/80 | HR 75 | Ht 71.0 in | Wt 183.6 lb

## 2020-05-18 DIAGNOSIS — M7021 Olecranon bursitis, right elbow: Secondary | ICD-10-CM

## 2020-05-18 NOTE — Patient Instructions (Signed)
Thank you for coming in today. You should hear from orthopedics soon.  Let me know if the wound becomes red or painful.  If so I will prescribe antibiotics.  Keep me updated.  Keep the bursa compressed.

## 2020-05-18 NOTE — Progress Notes (Signed)
I, Wendy Poet, LAT, ATC, am serving as scribe for Dr. Lynne Leader.  [name] is a 44 y.o. male who presents to [facility] today for f/u of R elbow pain and swelling.  He was last seen by Dr. Georgina Snell on 04/09/20 and had R elbow aspiration and injection.  He was advised to purchase an elbow compression sleeve.  Since his last visit, pt reports that his R elbow swelling has returned about 2 weeks ago.  He has been wearing his Body Helix since about a week after his last visit.  He does not report any pain in his R elbow, just swelling at the R olecrancon process.   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  Diabetes completely improved with normal A1c following pancreas transplant.   Exam:  BP 140/80 (BP Location: Right Arm, Patient Position: Sitting, Cuff Size: Normal)   Pulse 75   Ht 5\' 11"  (1.803 m)   Wt 183 lb 9.6 oz (83.3 kg)   SpO2 99%   BMI 25.61 kg/m  General: Well Developed, well nourished, and in no acute distress.   MSK: Right elbow swelling overlying right olecranon.  No skin erythema.  Nontender.  Soft. Normal elbow motion.    Lab and Radiology Results  Diagnostic Limited MSK Ultrasound of: Right olecranon bursa Swelling overlying olecranon with no loculations.  Hypoechoic change consistent with olecranon bursitis present. Impression: Olecranon bursitis   Procedure: Real-time Ultrasound Guided aspiration and injection right olecranon bursa changed to attempt at incision and drainage of presumed hematoma Device: Philips Affiniti 50G Images permanently stored and available for review in the ultrasound unit. Verbal informed consent obtained.  Discussed risks and benefits of procedure. Warned about infection bleeding damage to structures skin hypopigmentation and fat atrophy among others. Patient expresses  understanding and agreement Time-out conducted.   Noted no overlying erythema, induration, or other signs of local infection.   Skin prepped in a sterile fashion.   Local anesthesia: Topical Ethyl chloride.   With sterile technique and under real time ultrasound guidance:  1 mL of lidocaine injected achieving good anesthesia. The skin was again sterilized and an 18-gauge needle was used to access the olecranon bursa and position was confirmed twice with ultrasound guidance. 5 mL of bloody fluid was aspirated very slowly with several different needle position changes again confirmed with ultrasound. After discussion with patient attempted aspiration was abandoned.  At this time my Link Snuffer was that he had more of a coagulated hematoma that was not able to be aspirated.  After discussion plan to switch to incision and drainage and evacuation of hematoma. Skin was again sterilized and sharp incision was made to the bursa. Incision was widened and blunt dissection was used to widen the small incision and drainage hole. After several attempts no coagulated blood was removed.  At this point after discussion with patient we decided to discontinue the attempted procedure for fear of making things worse or potentially causing complications. Advised to call if fevers/chills, erythema, induration, drainage, or persistent bleeding.   Images permanently stored and available for review in the ultrasound unit.  Impression: Unsuccessful attempt at aspiration and drainage and then I&D olecranon bursitis/hematoma    Assessment and Plan: 44 y.o. male with right elbow olecranon bursitis versus hematoma.  As above unable to aspirate and incised and drainage hematoma/bursitis.  I feel very confident that  I had the needle correctly positioned on ultrasound and was getting some blood return with aspiration.  I was unable to successfully incise and drain the what I believe is a coagulated hematoma.  This is most likely  because the incision that I made was a bit too small to be truly successful.  However I decided after discussion with the patient to discontinue the attempt instead of proceeding with a wider incision.  At this point my office-based procedure was veering into surgical territory.  After discussion plan to refer to orthopedic surgery for further evaluation and management.  He may be a good candidate for true bursectomy which should not be done in the clinic by me.  Plan to refer to [facility] care.  He previously has a relationship with Dr. Sharol Given who amputated one of his legs.    Orders Placed This Encounter  Procedures  . [place] LIMITED JOINT SPACE STRUCTURES UP RIGHT(NO LINKED CHARGES)    Order Specific Question:   Reason for Exam (SYMPTOM  OR DIAGNOSIS REQUIRED)    Answer:   R elbow pain    Order Specific Question:   Preferred imaging location?    Answer:   [place]  . Ambulatory referral to Orthopedic Surgery    Referral Priority:   Routine    Referral Type:   Surgical    Referral Reason:   Specialty Services Required    Requested Specialty:   Orthopedic Surgery    Number of Visits Requested:   1   No orders of the defined types were placed in this encounter.    Discussed warning signs or symptoms. Please see discharge instructions. Patient expresses understanding.   The above documentation has been reviewed and is accurate and complete Lynne Leader, M.D.

## 2020-05-20 ENCOUNTER — Ambulatory Visit: Payer: Medicare Other | Admitting: Family Medicine

## 2020-05-21 ENCOUNTER — Ambulatory Visit (INDEPENDENT_AMBULATORY_CARE_PROVIDER_SITE_OTHER): Payer: Medicare Other | Admitting: Orthopedic Surgery

## 2020-05-21 ENCOUNTER — Other Ambulatory Visit: Payer: Self-pay

## 2020-05-21 ENCOUNTER — Encounter: Payer: Self-pay | Admitting: Orthopedic Surgery

## 2020-05-21 VITALS — Ht 71.0 in | Wt 183.0 lb

## 2020-05-21 DIAGNOSIS — M7021 Olecranon bursitis, right elbow: Secondary | ICD-10-CM | POA: Diagnosis not present

## 2020-05-21 NOTE — Progress Notes (Signed)
Office Visit Note   Patient: [name]           Date of Birth: 1976-06-21           MRN: [id number] Visit Date: 05/21/2020              Requested by: Gregor Hams, MD [place],   [id number] PCP: Marrian Salvage, FNP  Chief Complaint  Patient presents with  . Right Elbow - Pain      HPI: Patient is a 44 year old gentleman who was seen for initial evaluation for olecranon bursitis of the right elbow.  Patient has undergone good conservative therapy he states he has had 2 aspirations at one of our sports medicine with recurrence of the bursitis.  Patient complains of pain with activities of daily living he has tried pressure unloading and compression with out relief.  Patient has bilateral transtibial amputations without problems with his legs.  Assessment & Plan: Visit Diagnoses:  1. Olecranon bursitis, right elbow     Plan: Due to the failure of conservative treatment patient states he would like to proceed with surgical intervention for excision of the olecranon bursa we will plan for surgery as an outpatient discussed risks of wound healing complications as well as risk of recurrence of the olecranon bursitis patient states he understands wished to proceed at this time.  Follow-Up Instructions: Return in about 2 weeks (around 06/04/2020).   Ortho Exam  Patient is alert, oriented, no adenopathy, well-dressed, normal affect, normal respiratory effort. Patient has a negative history for gout there is no redness no cellulitis he does have a large olecranon bursal swelling that is about 5 cm in diameter this is tender to palpation no drainage the right upper extremity is neurovascular intact.  Imaging: No results found. No images are attached to the encounter.  Labs: Lab Results  Component Value Date   HGBA1C 5.4 10/26/2017   HGBA1C 5.4 12/31/2016   HGBA1C 5.5 04/22/2016   CRP 0.7 01/20/2020   CRP 0.7 01/19/2020   CRP 1.1 (H)  01/18/2020   REPTSTATUS 05/17/2017 FINAL 05/14/2017   GRAMSTAIN  05/14/2017    FEW WBC PRESENT, PREDOMINANTLY PMN RARE GRAM POSITIVE COCCI IN PAIRS    CULT NORMAL SKIN FLORA 05/14/2017     Lab Results  Component Value Date   ALBUMIN 4.1 02/26/2020   ALBUMIN 3.9 01/16/2020   ALBUMIN 4.1 10/25/2017    Lab Results  Component Value Date   MG 2.1 01/20/2020   MG 1.7 01/19/2020   MG 1.7 01/18/2020   No results found for: VD25OH  No results found for: PREALBUMIN CBC EXTENDED Latest Ref Rng & Units 02/26/2020 01/20/2020 01/19/2020  WBC 4.0 - 10.5 K/uL 7.0 4.1 3.8(L)  RBC 4.22 - 5.81 MIL/uL 5.94(H) 5.25 5.09  HGB 13.0 - 17.0 g/dL 18.3(H) 16.5 15.8  HCT 39.0 - 52.0 % 57.2(H) 49.3 47.9  PLT 150 - 400 K/uL 271 188 184  NEUTROABS 1.7 - 7.7 K/uL 4.5 2.8 2.8  LYMPHSABS 0.7 - 4.0 K/uL 1.4 0.6(L) 0.5(L)     Body mass index is 25.52 kg/m.  Orders:  No orders of the defined types were placed in this encounter.  No orders of the defined types were placed in this encounter.    Procedures: No procedures performed  Clinical Data: No additional findings.  ROS:  All other systems negative, except as noted in the HPI. Review of Systems  Objective: Vital Signs: Ht 5\' 11"  (1.803  m)   Wt 183 lb (83 kg)   BMI 25.52 kg/m   Specialty Comments:  No specialty comments available.  PMFS History: Patient Active Problem List   Diagnosis Date Noted  . GI bleed 01/17/2020  . Acute kidney injury due to COVID-19 ([place]) 01/16/2020  . Dehydration 01/16/2020  . Gastroenteritis due to COVID-19 virus 01/16/2020  . CKD (chronic kidney disease), stage III 10/26/2017  . Abnormal EKG 10/26/2017  . Hypertension 08/22/2017  . Acute GI bleeding 07/17/2017  . Hypertensive urgency 12/29/2016  . Hematemesis   . Healthcare maintenance 08/26/2016  . Back pain 12/15/2015  . History of diabetes mellitus 02/20/2015  . Abdominal pain 09/10/2014  . Weakness of left upper extremity 07/03/2014  .  Impaired mobility and activities of daily living 07/03/2014  . Sternoclavicular joint pain 03/04/2014  . Renal transplant, status post 07/19/2013  . History of pancreas transplant ([place]) 07/19/2013  . Immunosuppressed status ([place]) 06/27/2013  . H/O pancreas transplant ([place]) 06/27/2013  . Diabetic gastroparesis associated with type 1 diabetes mellitus ([place]) 05/08/2013  . BKA stump complication ([place]) 50/02/7047  . S/P bilateral BKA (below knee amputation) ([place]) 08/17/2012  . Metabolic bone disease 88/91/6945  . ESRD (end stage renal disease) ([place]) 11/17/2009  . GERD 03/14/2007  . HLD (hyperlipidemia) 02/22/2007  . Major depressive disorder, recurrent episode ([place]) 02/22/2007  . POST TRAUMATIC STRESS DISORDER 02/22/2007  . 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 Arthritis    "I think I do; just in my fingers & my hands"  . Blood transfusion   . Cataract   . Chronic pain   . Depression    Patient states he has never been depressed.  . Diabetes mellitus without complication ([place])    no since pancreas transplant  . Dialysis patient [facility]) 04/18/12   "[facility]; Tues, [place], [place]"  . Edema   . ESRD (end stage renal disease) on dialysis ([place])   . Gastroparesis   . Gastropathy   . GERD (gastroesophageal reflux disease)   . Headache(784.0)   . Hypertension     Family History  Problem Relation Age of Onset  . Diabetes Other   . Hypertension Other   . Hypertension Mother   . Diabetes Mother   . Kidney disease Mother   . Diabetes Maternal Grandmother   . Diabetes Paternal Grandmother   . Colon cancer Neg Hx   . Esophageal cancer Neg Hx   . Rectal cancer Neg Hx   . Stomach cancer Neg Hx     Past Surgical History:  Procedure Laterality Date  . AV FISTULA PLACEMENT  08/2011   left upper arm  . BELOW KNEE LEG AMPUTATION  "it's been awhile"   bilaterally  . CATARACT EXTRACTION  ~ 2011    right  . COMBINED KIDNEY-PANCREAS TRANSPLANT  2014  . ESOPHAGOGASTRODUODENOSCOPY N/A 12/30/2016   Procedure: ESOPHAGOGASTRODUODENOSCOPY (EGD);  Surgeon: Carol Ada, MD;  Location: [facility] ENDOSCOPY;  Service: Endoscopy;  Laterality: N/A;   Social History   Occupational History  . Occupation: Disability  Tobacco Use  . Smoking status: Current Some Day Smoker  . Smokeless tobacco: Never Used  Substance and Sexual Activity  . Alcohol use: No  . Drug use: Yes    Frequency: 3.0 times per week    Types: Marijuana    Comment: Occasionally  . Sexual activity: Not Currently

## 2020-05-22 DIAGNOSIS — Z79899 Other long term (current) drug therapy: Secondary | ICD-10-CM | POA: Diagnosis not present

## 2020-05-22 DIAGNOSIS — I10 Essential (primary) hypertension: Secondary | ICD-10-CM | POA: Diagnosis not present

## 2020-05-22 DIAGNOSIS — K219 Gastro-esophageal reflux disease without esophagitis: Secondary | ICD-10-CM | POA: Diagnosis not present

## 2020-05-22 DIAGNOSIS — Z7952 Long term (current) use of systemic steroids: Secondary | ICD-10-CM | POA: Diagnosis not present

## 2020-05-22 DIAGNOSIS — Z9483 Pancreas transplant status: Secondary | ICD-10-CM | POA: Diagnosis not present

## 2020-05-22 DIAGNOSIS — K3184 Gastroparesis: Secondary | ICD-10-CM | POA: Diagnosis not present

## 2020-05-22 DIAGNOSIS — D8989 Other specified disorders involving the immune mechanism, not elsewhere classified: Secondary | ICD-10-CM | POA: Diagnosis not present

## 2020-05-22 DIAGNOSIS — Z48288 Encounter for aftercare following multiple organ transplant: Secondary | ICD-10-CM | POA: Diagnosis not present

## 2020-05-22 DIAGNOSIS — Z792 Long term (current) use of antibiotics: Secondary | ICD-10-CM | POA: Diagnosis not present

## 2020-05-22 DIAGNOSIS — Z940 Kidney transplant status: Secondary | ICD-10-CM | POA: Diagnosis not present

## 2020-05-22 DIAGNOSIS — E109 Type 1 diabetes mellitus without complications: Secondary | ICD-10-CM | POA: Diagnosis not present

## 2020-05-28 DIAGNOSIS — Z89511 Acquired absence of right leg below knee: Secondary | ICD-10-CM | POA: Diagnosis not present

## 2020-06-01 ENCOUNTER — Telehealth: Payer: Self-pay | Admitting: Orthopedic Surgery

## 2020-06-01 NOTE — Telephone Encounter (Signed)
Patient called wanting to get his surgery scheduled with Dr. Sharol Given.  CB#[phone].  Thank you.

## 2020-06-05 DIAGNOSIS — Z48288 Encounter for aftercare following multiple organ transplant: Secondary | ICD-10-CM | POA: Diagnosis not present

## 2020-06-05 DIAGNOSIS — Z79899 Other long term (current) drug therapy: Secondary | ICD-10-CM | POA: Diagnosis not present

## 2020-06-05 DIAGNOSIS — Z940 Kidney transplant status: Secondary | ICD-10-CM | POA: Diagnosis not present

## 2020-06-05 DIAGNOSIS — Z9483 Pancreas transplant status: Secondary | ICD-10-CM | POA: Diagnosis not present

## 2020-06-07 ENCOUNTER — Other Ambulatory Visit: Payer: Self-pay | Admitting: Physician Assistant

## 2020-06-11 ENCOUNTER — Telehealth: Payer: Self-pay

## 2020-06-11 NOTE — Telephone Encounter (Signed)
New message    The patient is asking for a referral sent to [facility] on [address]  Reason: pain clinic he was going to has closed down

## 2020-06-12 ENCOUNTER — Other Ambulatory Visit: Payer: Self-pay | Admitting: Family

## 2020-06-12 DIAGNOSIS — G8929 Other chronic pain: Secondary | ICD-10-CM

## 2020-06-12 NOTE — Telephone Encounter (Signed)
I don't think we need to refer him to [facility]- I think he can make his own appointment with them.

## 2020-06-16 DIAGNOSIS — K219 Gastro-esophageal reflux disease without esophagitis: Secondary | ICD-10-CM | POA: Diagnosis not present

## 2020-06-16 DIAGNOSIS — Z7952 Long term (current) use of systemic steroids: Secondary | ICD-10-CM | POA: Diagnosis not present

## 2020-06-16 DIAGNOSIS — I10 Essential (primary) hypertension: Secondary | ICD-10-CM | POA: Diagnosis not present

## 2020-06-16 DIAGNOSIS — K3184 Gastroparesis: Secondary | ICD-10-CM | POA: Diagnosis not present

## 2020-06-16 DIAGNOSIS — E109 Type 1 diabetes mellitus without complications: Secondary | ICD-10-CM | POA: Diagnosis not present

## 2020-06-16 DIAGNOSIS — Z48288 Encounter for aftercare following multiple organ transplant: Secondary | ICD-10-CM | POA: Diagnosis not present

## 2020-06-16 DIAGNOSIS — Z9483 Pancreas transplant status: Secondary | ICD-10-CM | POA: Diagnosis not present

## 2020-06-16 DIAGNOSIS — D849 Immunodeficiency, unspecified: Secondary | ICD-10-CM | POA: Diagnosis not present

## 2020-06-16 DIAGNOSIS — E861 Hypovolemia: Secondary | ICD-10-CM | POA: Diagnosis not present

## 2020-06-16 DIAGNOSIS — Z79899 Other long term (current) drug therapy: Secondary | ICD-10-CM | POA: Diagnosis not present

## 2020-06-16 DIAGNOSIS — R7989 Other specified abnormal findings of blood chemistry: Secondary | ICD-10-CM | POA: Diagnosis not present

## 2020-06-16 DIAGNOSIS — Z940 Kidney transplant status: Secondary | ICD-10-CM | POA: Diagnosis not present

## 2020-06-16 DIAGNOSIS — Z4822 Encounter for aftercare following kidney transplant: Secondary | ICD-10-CM | POA: Diagnosis not present

## 2020-06-16 DIAGNOSIS — Z792 Long term (current) use of antibiotics: Secondary | ICD-10-CM | POA: Diagnosis not present

## 2020-06-17 ENCOUNTER — Other Ambulatory Visit: Payer: Self-pay

## 2020-06-17 ENCOUNTER — Encounter (HOSPITAL_BASED_OUTPATIENT_CLINIC_OR_DEPARTMENT_OTHER): Payer: Self-pay | Admitting: Orthopedic Surgery

## 2020-06-18 DIAGNOSIS — Z8639 Personal history of other endocrine, nutritional and metabolic disease: Secondary | ICD-10-CM | POA: Diagnosis not present

## 2020-06-18 DIAGNOSIS — Z4822 Encounter for aftercare following kidney transplant: Secondary | ICD-10-CM | POA: Diagnosis not present

## 2020-06-18 DIAGNOSIS — Z940 Kidney transplant status: Secondary | ICD-10-CM | POA: Diagnosis not present

## 2020-06-19 ENCOUNTER — Other Ambulatory Visit (HOSPITAL_COMMUNITY)
Admission: RE | Admit: 2020-06-19 | Discharge: 2020-06-19 | Disposition: A | Discharge status: Home / Self Care | Payer: Medicare Other | Source: Ambulatory Visit | Attending: Orthopedic Surgery | Admitting: Orthopedic Surgery

## 2020-06-19 ENCOUNTER — Encounter (HOSPITAL_BASED_OUTPATIENT_CLINIC_OR_DEPARTMENT_OTHER)
Admission: RE | Admit: 2020-06-19 | Discharge: 2020-06-19 | Disposition: A | Discharge status: Home / Self Care | Payer: Medicare Other | Source: Ambulatory Visit | Attending: Orthopedic Surgery | Admitting: Orthopedic Surgery

## 2020-06-19 DIAGNOSIS — Z20822 Contact with and (suspected) exposure to covid-19: Secondary | ICD-10-CM | POA: Insufficient documentation

## 2020-06-19 DIAGNOSIS — Z7952 Long term (current) use of systemic steroids: Secondary | ICD-10-CM | POA: Diagnosis not present

## 2020-06-19 DIAGNOSIS — Z89511 Acquired absence of right leg below knee: Secondary | ICD-10-CM | POA: Diagnosis not present

## 2020-06-19 DIAGNOSIS — Z01812 Encounter for preprocedural laboratory examination: Secondary | ICD-10-CM | POA: Diagnosis not present

## 2020-06-19 DIAGNOSIS — M7021 Olecranon bursitis, right elbow: Secondary | ICD-10-CM | POA: Diagnosis not present

## 2020-06-19 DIAGNOSIS — F17290 Nicotine dependence, other tobacco product, uncomplicated: Secondary | ICD-10-CM | POA: Diagnosis not present

## 2020-06-19 DIAGNOSIS — Z792 Long term (current) use of antibiotics: Secondary | ICD-10-CM | POA: Diagnosis not present

## 2020-06-19 DIAGNOSIS — E1122 Type 2 diabetes mellitus with diabetic chronic kidney disease: Secondary | ICD-10-CM | POA: Diagnosis not present

## 2020-06-19 DIAGNOSIS — Z79891 Long term (current) use of opiate analgesic: Secondary | ICD-10-CM | POA: Diagnosis not present

## 2020-06-19 DIAGNOSIS — K219 Gastro-esophageal reflux disease without esophagitis: Secondary | ICD-10-CM | POA: Diagnosis not present

## 2020-06-19 DIAGNOSIS — E1136 Type 2 diabetes mellitus with diabetic cataract: Secondary | ICD-10-CM | POA: Diagnosis not present

## 2020-06-19 DIAGNOSIS — Z89512 Acquired absence of left leg below knee: Secondary | ICD-10-CM | POA: Diagnosis not present

## 2020-06-19 DIAGNOSIS — Z79899 Other long term (current) drug therapy: Secondary | ICD-10-CM | POA: Diagnosis not present

## 2020-06-19 DIAGNOSIS — I120 Hypertensive chronic kidney disease with stage 5 chronic kidney disease or end stage renal disease: Secondary | ICD-10-CM | POA: Diagnosis not present

## 2020-06-19 DIAGNOSIS — Z992 Dependence on renal dialysis: Secondary | ICD-10-CM | POA: Diagnosis not present

## 2020-06-19 DIAGNOSIS — Z888 Allergy status to other drugs, medicaments and biological substances status: Secondary | ICD-10-CM | POA: Diagnosis not present

## 2020-06-19 DIAGNOSIS — N186 End stage renal disease: Secondary | ICD-10-CM | POA: Diagnosis not present

## 2020-06-19 DIAGNOSIS — E1143 Type 2 diabetes mellitus with diabetic autonomic (poly)neuropathy: Secondary | ICD-10-CM | POA: Diagnosis not present

## 2020-06-19 DIAGNOSIS — K3184 Gastroparesis: Secondary | ICD-10-CM | POA: Diagnosis not present

## 2020-06-19 LAB — CBC
HCT: 48.5 % (ref 39.0–52.0)
Hemoglobin: 15.3 g/dL (ref 13.0–17.0)
MCH: 30.5 pg (ref 26.0–34.0)
MCHC: 31.5 g/dL (ref 30.0–36.0)
MCV: 96.6 fL (ref 80.0–100.0)
Platelets: 269 10*3/uL (ref 150–400)
RBC: 5.02 MIL/uL (ref 4.22–5.81)
RDW: 14 % (ref 11.5–15.5)
WBC: 7.4 10*3/uL (ref 4.0–10.5)
nRBC: 0 % (ref 0.0–0.2)

## 2020-06-19 LAB — COMPREHENSIVE METABOLIC PANEL
ALT: 11 U/L (ref 0–44)
AST: 11 U/L — ABNORMAL LOW (ref 15–41)
Albumin: 3.4 g/dL — ABNORMAL LOW (ref 3.5–5.0)
Alkaline Phosphatase: 88 U/L (ref 38–126)
Anion gap: 7 (ref 5–15)
BUN: 15 mg/dL (ref 6–20)
CO2: 25 mmol/L (ref 22–32)
Calcium: 9.2 mg/dL (ref 8.9–10.3)
Chloride: 106 mmol/L (ref 98–111)
Creatinine, Ser: 1.52 mg/dL — ABNORMAL HIGH (ref 0.61–1.24)
GFR calc Af Amer: >60 mL/min (ref >60)
GFR calc non Af Amer: 55 mL/min — ABNORMAL LOW (ref >60)
Glucose, Bld: 108 mg/dL — ABNORMAL HIGH (ref 70–99)
Potassium: 4.7 mmol/L (ref 3.5–5.1)
Sodium: 138 mmol/L (ref 135–145)
Total Bilirubin: 0.8 mg/dL (ref 0.3–1.2)
Total Protein: 6.6 g/dL (ref 6.5–8.1)

## 2020-06-19 LAB — SARS CORONAVIRUS 2 (TAT 6-24 HRS): SARS Coronavirus 2: NEGATIVE

## 2020-06-19 NOTE — Progress Notes (Signed)

## 2020-06-23 ENCOUNTER — Ambulatory Visit (HOSPITAL_BASED_OUTPATIENT_CLINIC_OR_DEPARTMENT_OTHER): Payer: Medicare Other | Admitting: Anesthesiology

## 2020-06-23 ENCOUNTER — Encounter (HOSPITAL_BASED_OUTPATIENT_CLINIC_OR_DEPARTMENT_OTHER)
Admission: RE | Disposition: A | Discharge status: Home / Self Care | Payer: Self-pay | Source: Home / Self Care | Attending: Orthopedic Surgery

## 2020-06-23 ENCOUNTER — Ambulatory Visit (HOSPITAL_BASED_OUTPATIENT_CLINIC_OR_DEPARTMENT_OTHER)
Admission: RE | Admit: 2020-06-23 | Discharge: 2020-06-23 | Disposition: A | Discharge status: Home / Self Care | Payer: Medicare Other | Attending: Orthopedic Surgery | Admitting: Orthopedic Surgery

## 2020-06-23 ENCOUNTER — Encounter (HOSPITAL_BASED_OUTPATIENT_CLINIC_OR_DEPARTMENT_OTHER): Payer: Self-pay | Admitting: Orthopedic Surgery

## 2020-06-23 ENCOUNTER — Other Ambulatory Visit: Payer: Self-pay

## 2020-06-23 DIAGNOSIS — K219 Gastro-esophageal reflux disease without esophagitis: Secondary | ICD-10-CM | POA: Insufficient documentation

## 2020-06-23 DIAGNOSIS — E1136 Type 2 diabetes mellitus with diabetic cataract: Secondary | ICD-10-CM | POA: Insufficient documentation

## 2020-06-23 DIAGNOSIS — F17290 Nicotine dependence, other tobacco product, uncomplicated: Secondary | ICD-10-CM | POA: Insufficient documentation

## 2020-06-23 DIAGNOSIS — N186 End stage renal disease: Secondary | ICD-10-CM | POA: Insufficient documentation

## 2020-06-23 DIAGNOSIS — Z888 Allergy status to other drugs, medicaments and biological substances status: Secondary | ICD-10-CM | POA: Insufficient documentation

## 2020-06-23 DIAGNOSIS — E1143 Type 2 diabetes mellitus with diabetic autonomic (poly)neuropathy: Secondary | ICD-10-CM | POA: Insufficient documentation

## 2020-06-23 DIAGNOSIS — I120 Hypertensive chronic kidney disease with stage 5 chronic kidney disease or end stage renal disease: Secondary | ICD-10-CM | POA: Insufficient documentation

## 2020-06-23 DIAGNOSIS — Z7952 Long term (current) use of systemic steroids: Secondary | ICD-10-CM | POA: Insufficient documentation

## 2020-06-23 DIAGNOSIS — E1122 Type 2 diabetes mellitus with diabetic chronic kidney disease: Secondary | ICD-10-CM | POA: Insufficient documentation

## 2020-06-23 DIAGNOSIS — M7021 Olecranon bursitis, right elbow: Secondary | ICD-10-CM | POA: Diagnosis not present

## 2020-06-23 DIAGNOSIS — Z89511 Acquired absence of right leg below knee: Secondary | ICD-10-CM | POA: Insufficient documentation

## 2020-06-23 DIAGNOSIS — Z79891 Long term (current) use of opiate analgesic: Secondary | ICD-10-CM | POA: Insufficient documentation

## 2020-06-23 DIAGNOSIS — Z89512 Acquired absence of left leg below knee: Secondary | ICD-10-CM | POA: Insufficient documentation

## 2020-06-23 DIAGNOSIS — Z79899 Other long term (current) drug therapy: Secondary | ICD-10-CM | POA: Diagnosis not present

## 2020-06-23 DIAGNOSIS — Z992 Dependence on renal dialysis: Secondary | ICD-10-CM | POA: Insufficient documentation

## 2020-06-23 DIAGNOSIS — K3184 Gastroparesis: Secondary | ICD-10-CM | POA: Insufficient documentation

## 2020-06-23 DIAGNOSIS — Z792 Long term (current) use of antibiotics: Secondary | ICD-10-CM | POA: Insufficient documentation

## 2020-06-23 HISTORY — PX: OLECRANON BURSECTOMY: SHX2097

## 2020-06-23 HISTORY — DX: Methicillin resistant Staphylococcus aureus infection, unspecified site: A49.02

## 2020-06-23 SURGERY — BURSECTOMY, ELBOW
Anesthesia: General | Site: Elbow | Laterality: Right

## 2020-06-23 MED ORDER — CEFAZOLIN SODIUM-DEXTROSE 2-4 GM/100ML-% IV SOLN
2.0000 g | INTRAVENOUS | Status: AC
  Administered 2020-06-23: 2 g via INTRAVENOUS

## 2020-06-23 MED ORDER — MIDAZOLAM HCL 2 MG/2ML IJ SOLN
INTRAMUSCULAR | Status: AC
  Filled 2020-06-23: qty 2

## 2020-06-23 MED ORDER — PHENYLEPHRINE HCL (PRESSORS) 10 MG/ML IV SOLN
INTRAVENOUS | Status: DC | PRN
  Administered 2020-06-23: 80 ug via INTRAVENOUS

## 2020-06-23 MED ORDER — FENTANYL CITRATE (PF) 100 MCG/2ML IJ SOLN
INTRAMUSCULAR | Status: AC
  Filled 2020-06-23: qty 2

## 2020-06-23 MED ORDER — HYDROMORPHONE HCL 1 MG/ML IJ SOLN
INTRAMUSCULAR | Status: AC
  Filled 2020-06-23: qty 0.5

## 2020-06-23 MED ORDER — AMISULPRIDE (ANTIEMETIC) 5 MG/2ML IV SOLN: 10.0000 mg | Freq: Once | INTRAVENOUS | Status: DC | PRN

## 2020-06-23 MED ORDER — PROMETHAZINE HCL 25 MG/ML IJ SOLN: 6.2500 mg | INTRAMUSCULAR | Status: DC | PRN

## 2020-06-23 MED ORDER — PROPOFOL 10 MG/ML IV BOLUS
INTRAVENOUS | Status: AC
  Filled 2020-06-23: qty 20

## 2020-06-23 MED ORDER — OXYCODONE-ACETAMINOPHEN 5-325 MG PO TABS: 1.0000 | ORAL_TABLET | ORAL | 0 refills | Status: DC | PRN

## 2020-06-23 MED ORDER — OXYCODONE HCL 5 MG PO TABS
5.0000 mg | ORAL_TABLET | Freq: Once | ORAL | Status: AC | PRN
  Administered 2020-06-23: 5 mg via ORAL

## 2020-06-23 MED ORDER — LIDOCAINE 2% (20 MG/ML) 5 ML SYRINGE
INTRAMUSCULAR | Status: AC
  Filled 2020-06-23: qty 5

## 2020-06-23 MED ORDER — PROPOFOL 10 MG/ML IV BOLUS
INTRAVENOUS | Status: DC | PRN
  Administered 2020-06-23: 200 mg via INTRAVENOUS

## 2020-06-23 MED ORDER — LACTATED RINGERS IV SOLN: INTRAVENOUS | Status: DC

## 2020-06-23 MED ORDER — FENTANYL CITRATE (PF) 100 MCG/2ML IJ SOLN
INTRAMUSCULAR | Status: DC | PRN
  Administered 2020-06-23 (×4): 25 ug via INTRAVENOUS

## 2020-06-23 MED ORDER — MIDAZOLAM HCL 5 MG/5ML IJ SOLN
INTRAMUSCULAR | Status: DC | PRN
  Administered 2020-06-23: 2 mg via INTRAVENOUS

## 2020-06-23 MED ORDER — OXYCODONE HCL 5 MG PO TABS
ORAL_TABLET | ORAL | Status: AC
  Filled 2020-06-23: qty 1

## 2020-06-23 MED ORDER — ONDANSETRON HCL 4 MG/2ML IJ SOLN
INTRAMUSCULAR | Status: DC | PRN
  Administered 2020-06-23: 4 mg via INTRAVENOUS

## 2020-06-23 MED ORDER — HYDROMORPHONE HCL 1 MG/ML IJ SOLN
0.2500 mg | INTRAMUSCULAR | Status: DC | PRN
  Administered 2020-06-23 (×2): 0.25 mg via INTRAVENOUS

## 2020-06-23 MED ORDER — OXYCODONE HCL 5 MG/5ML PO SOLN: 5.0000 mg | Freq: Once | ORAL | Status: AC | PRN

## 2020-06-23 MED ORDER — DEXAMETHASONE SODIUM PHOSPHATE 10 MG/ML IJ SOLN
INTRAMUSCULAR | Status: DC | PRN
  Administered 2020-06-23: 4 mg via INTRAVENOUS

## 2020-06-23 MED ORDER — LIDOCAINE HCL (CARDIAC) PF 100 MG/5ML IV SOSY
PREFILLED_SYRINGE | INTRAVENOUS | Status: DC | PRN
  Administered 2020-06-23: 60 mg via INTRAVENOUS

## 2020-06-23 SURGICAL SUPPLY — 48 items
BLADE SURG 15 STRL LF DISP TIS (BLADE) ×1 IMPLANT
BLADE SURG 15 STRL SS (BLADE) ×3
BNDG COHESIVE 4X5 TAN STRL (GAUZE/BANDAGES/DRESSINGS) ×6 IMPLANT
BNDG ESMARK 4X9 LF (GAUZE/BANDAGES/DRESSINGS) IMPLANT
BNDG GAUZE ELAST 4 BULKY (GAUZE/BANDAGES/DRESSINGS) ×3 IMPLANT
COVER BACK TABLE 60X90IN (DRAPES) ×3 IMPLANT
COVER WAND RF STERILE (DRAPES) IMPLANT
DECANTER SPIKE VIAL GLASS SM (MISCELLANEOUS) IMPLANT
DRAPE EXTREMITY T 121X128X90 (DISPOSABLE) ×3 IMPLANT
DRAPE U-SHAPE 47X51 STRL (DRAPES) IMPLANT
DRSG EMULSION OIL 3X3 NADH (GAUZE/BANDAGES/DRESSINGS) ×3 IMPLANT
DURAPREP 26ML APPLICATOR (WOUND CARE) ×3 IMPLANT
ELECT NEEDLE TIP 2.8 STRL (NEEDLE) IMPLANT
ELECT REM PT RETURN 9FT ADLT (ELECTROSURGICAL) ×3
ELECTRODE REM PT RTRN 9FT ADLT (ELECTROSURGICAL) ×1 IMPLANT
GAUZE 4X4 16PLY RFD (DISPOSABLE) IMPLANT
GAUZE SPONGE 4X4 12PLY STRL (GAUZE/BANDAGES/DRESSINGS) ×3 IMPLANT
GLOVE BIO SURGEON STRL SZ 6.5 (GLOVE) ×4 IMPLANT
GLOVE BIO SURGEONS STRL SZ 6.5 (GLOVE) ×2
GLOVE BIOGEL PI IND STRL 7.0 (GLOVE) ×2 IMPLANT
GLOVE BIOGEL PI IND STRL 8.5 (GLOVE) ×1 IMPLANT
GLOVE BIOGEL PI INDICATOR 7.0 (GLOVE) ×4
GLOVE BIOGEL PI INDICATOR 8.5 (GLOVE) ×2
GLOVE SURG ORTHO 8.5 STRL (GLOVE) ×3 IMPLANT
GLOVE SURG ORTHO 9.0 STRL STRW (GLOVE) ×3 IMPLANT
GOWN STRL REUS W/ TWL LRG LVL3 (GOWN DISPOSABLE) ×2 IMPLANT
GOWN STRL REUS W/ TWL XL LVL3 (GOWN DISPOSABLE) ×1 IMPLANT
GOWN STRL REUS W/TWL LRG LVL3 (GOWN DISPOSABLE) ×6
GOWN STRL REUS W/TWL XL LVL3 (GOWN DISPOSABLE) ×3
NEEDLE HYPO 25X1 1.5 SAFETY (NEEDLE) IMPLANT
NS IRRIG 1000ML POUR BTL (IV SOLUTION) ×3 IMPLANT
PAD CAST 4YDX4 CTTN HI CHSV (CAST SUPPLIES) ×1 IMPLANT
PADDING CAST ABS 4INX4YD NS (CAST SUPPLIES)
PADDING CAST ABS COTTON 4X4 ST (CAST SUPPLIES) IMPLANT
PADDING CAST COTTON 4X4 STRL (CAST SUPPLIES) ×3
PENCIL SMOKE EVACUATOR (MISCELLANEOUS) IMPLANT
SET BASIN DAY SURGERY F.S. (CUSTOM PROCEDURE TRAY) ×3 IMPLANT
SHEET MEDIUM DRAPE 40X70 STRL (DRAPES) IMPLANT
SPONGE LAP 18X18 RF (DISPOSABLE) ×3 IMPLANT
STOCKINETTE 6  STRL (DRAPES) ×3
STOCKINETTE 6 STRL (DRAPES) ×1 IMPLANT
STOCKINETTE IMPERVIOUS LG (DRAPES) ×3 IMPLANT
SUT ETHILON 3 0 FSL (SUTURE) IMPLANT
SUT ETHILON 3 0 FSLX (SUTURE) ×6 IMPLANT
SUT ETHILON 5 0 PS 2 18 (SUTURE) IMPLANT
SYR BULB EAR ULCER 3OZ GRN STR (SYRINGE) ×3 IMPLANT
SYR CONTROL 10ML LL (SYRINGE) IMPLANT
TOWEL GREEN STERILE FF (TOWEL DISPOSABLE) ×3 IMPLANT

## 2020-06-23 NOTE — Anesthesia Preprocedure Evaluation (Signed)
Anesthesia Evaluation  Patient identified by MRN, date of birth, ID band Patient awake    Reviewed: Allergy & Precautions, NPO status , Patient's Chart, lab work & pertinent test results  Airway Mallampati: II  TM Distance: >3 FB Neck ROM: Full    Dental no notable dental hx.    Pulmonary neg pulmonary ROS, Current Smoker and Patient abstained from smoking.,    Pulmonary exam normal breath sounds clear to auscultation       Cardiovascular hypertension, negative cardio ROS Normal cardiovascular exam Rhythm:Regular Rate:Normal     Neuro/Psych  Headaches, Anxiety Depression negative psych ROS   GI/Hepatic Neg liver ROS, GERD  ,  Endo/Other  negative endocrine ROSdiabetes  Renal/GU Renal InsufficiencyRenal disease  negative genitourinary   Musculoskeletal  (+) Arthritis , Osteoarthritis,    Abdominal   Peds negative pediatric ROS (+)  Hematology negative hematology ROS (+)   Anesthesia Other Findings   Reproductive/Obstetrics negative OB ROS                             Anesthesia Physical Anesthesia Plan  ASA: III  Anesthesia Plan: General   Post-op Pain Management:    Induction: Intravenous  PONV Risk Score and Plan: 1 and Ondansetron and Treatment may vary due to age or medical condition  Airway Management Planned: LMA  Additional Equipment:   Intra-op Plan:   Post-operative Plan: Extubation in OR  Informed Consent: I have reviewed the patients History and Physical, chart, labs and discussed the procedure including the risks, benefits and alternatives for the proposed anesthesia with the patient or authorized representative who has indicated his/her understanding and acceptance.     Dental advisory given  Plan Discussed with: CRNA  Anesthesia Plan Comments:         Anesthesia Quick Evaluation

## 2020-06-23 NOTE — Op Note (Signed)
06/23/2020  9:16 AM  PATIENT:  [name]    PRE-OPERATIVE DIAGNOSIS:  Olecranon Bursitis Right Elbow  POST-OPERATIVE DIAGNOSIS:  Same  PROCEDURE:  RIGHT ELBOW EXCISION OLECRANON BURSITIS  SURGEON:  Newt Minion, MD  PHYSICIAN ASSISTANT:None ANESTHESIA:   General  PREOPERATIVE INDICATIONS:  [name] is a  44 y.o. male with a diagnosis of Olecranon Bursitis Right Elbow who failed conservative measures and elected for surgical management.    The risks benefits and alternatives were discussed with the patient preoperatively including but not limited to the risks of infection, bleeding, nerve injury, cardiopulmonary complications, the need for revision surgery, among others, and the patient was willing to proceed.  OPERATIVE IMPLANTS: None.  @ENCIMAGES @  OPERATIVE FINDINGS: Olecranon bursa resected in 1 block of tissue 7 x 4 cm.  OPERATIVE PROCEDURE: Patient brought the operating room and underwent an LMA anesthetic.  After adequate levels anesthesia were obtained patient's right upper extremity was prepped using DuraPrep draped in the sterile field a timeout was called.  A dorsal incision was made directly over the olecranon bursa.  Dissection was carried down to the bursa and the bursal sac was dissected and excised in 1 block of tissue.  The olecranon bursal sac was 7 x 4 cm.  The wound was irrigated with normal saline electrocautery was used for hemostasis.  The incision was closed using 2-0 nylon.  A sterile dressing was applied patient was extubated taken to PACU in stable condition.    DISCHARGE PLANNING:  Antibiotic duration: Preoperative antibiotics  Weightbearing: Not applicable  Pain medication: Prescription for Percocet  Dressing care/ Wound VAC: Follow-up in the office 1 week to change the dressing  Ambulatory devices: Not applicable  Discharge to: Home.  Follow-up: In the office 1 week post operative.

## 2020-06-23 NOTE — Anesthesia Procedure Notes (Signed)
Procedure Name: LMA Insertion Date/Time: 06/23/2020 8:47 AM Performed by: Lavonia Dana, CRNA Pre-anesthesia Checklist: Patient identified, Emergency Drugs available, Suction available and Patient being monitored Patient Re-evaluated:Patient Re-evaluated prior to induction Oxygen Delivery Method: Circle system utilized Preoxygenation: Pre-oxygenation with 100% oxygen Induction Type: IV induction Ventilation: Mask ventilation without difficulty LMA: LMA inserted LMA Size: 5.0 Number of attempts: 1 Airway Equipment and Method: Bite block Placement Confirmation: positive ETCO2 Tube secured with: Tape Dental Injury: Teeth and Oropharynx as per pre-operative assessment

## 2020-06-23 NOTE — Discharge Instructions (Signed)

## 2020-06-23 NOTE — Transfer of Care (Signed)
Immediate Anesthesia Transfer of Care Note  Patient: [name]  Procedure(s) Performed: RIGHT ELBOW EXCISION OLECRANON BURSITIS (Right Elbow)  Patient Location: PACU  Anesthesia Type:General  Level of Consciousness: awake, alert  and oriented  Airway & Oxygen Therapy: Patient Spontanous Breathing and Patient connected to face mask oxygen  Post-op Assessment: Report given to RN and Post -op Vital signs reviewed and stable  Post vital signs: Reviewed and stable  Last Vitals:  Vitals Value Taken Time  BP 161/73 06/23/20 0925  Temp 36.6 C 06/23/20 0925  Pulse 80 06/23/20 0927  Resp 32 06/23/20 0927  SpO2 100 % 06/23/20 0927  Vitals shown include unvalidated device data.  Last Pain:  Vitals:   06/23/20 0706  TempSrc: Oral  PainSc: 6       Patients Stated Pain Goal: 6 (29/51/88 4166)  Complications: No complications documented.

## 2020-06-23 NOTE — H&P (Signed)
[name] is an 44 y.o. male.   Chief Complaint: Right elbow pain [name] is a 44 year old gentleman who was seen for initial evaluation for olecranon bursitis of the right elbow.  Patient has undergone good conservative therapy he states he has had 2 aspirations at one of our sports medicine with recurrence of the bursitis.  Patient complains of pain with activities of daily living he has tried pressure unloading and compression with out relief.  Patient has bilateral transtibial amputations without problems with his legs.  Past Medical History:  Diagnosis Date  . 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 Arthritis    "I think I do; just in my fingers & my hands"  . Blood transfusion   . Cataract   . Chronic pain   . Depression    Patient states he has never been depressed.  . Diabetes mellitus without complication ([place])    no since pancreas transplant  . Dialysis patient [facility]) 04/18/12   "[facility]; Tues, [place], [place]"  . Edema   . ESRD (end stage renal disease) on dialysis ([place])   . Gastroparesis   . Gastropathy   . GERD (gastroesophageal reflux disease)   . Headache(784.0)   . Hypertension     Past Surgical History:  Procedure Laterality Date  . AV FISTULA PLACEMENT  08/2011   left upper arm  . BELOW KNEE LEG AMPUTATION  "it's been awhile"   bilaterally  . CATARACT EXTRACTION  ~ 2011   right  . COMBINED KIDNEY-PANCREAS TRANSPLANT  2014  . ESOPHAGOGASTRODUODENOSCOPY N/A 12/30/2016   Procedure: ESOPHAGOGASTRODUODENOSCOPY (EGD);  Surgeon: Carol Ada, MD;  Location: [facility] ENDOSCOPY;  Service: Endoscopy;  Laterality: N/A;    Family History  Problem Relation Age of Onset  . Diabetes Other   . Hypertension Other   . Hypertension Mother   . Diabetes Mother   . Kidney disease Mother   . Diabetes Maternal Grandmother   . Diabetes Paternal Grandmother   . Colon cancer Neg Hx   . Esophageal cancer Neg Hx   . Rectal cancer Neg Hx   . Stomach cancer  Neg Hx    Social History:  reports that he has been smoking cigars. He has never used smokeless tobacco. He reports previous drug use. Frequency: 3.00 times per week. Drug: Marijuana. He reports that he does not drink alcohol.  Allergies:  Allergies  Allergen Reactions  . Lisinopril     Cough  . Amlodipine Rash    Medications Prior to Admission  Medication Sig Dispense Refill  . aspirin EC 81 MG tablet Take 81 mg by mouth daily.    . hydrALAZINE (APRESOLINE) 50 MG tablet Take 1 tablet (50 mg total) by mouth 3 (three) times daily. (Patient taking differently: Take 75 mg by mouth 2 (two) times daily. ) 30 tablet 0  . labetalol (NORMODYNE) 200 MG tablet TK 1 T PO BID  6  . metoCLOPramide (REGLAN) 10 MG tablet Take 1 tablet (10 mg total) by mouth 4 (four) times daily. Take 1tab 20-30 minutes before breakfast, lunch, dinner and at bedtime 120 tablet 1  . mycophenolate (MYFORTIC) 180 MG EC tablet Take 360 mg by mouth 2 (two) times daily.     [name] omeprazole (PRILOSEC) 40 MG capsule Take 1 capsule (40 mg total) by mouth 2 (two) times daily before a meal. 60 capsule 0  . OxyCODONE HCl 15 MG TABA Take 15 mg by mouth 4 (four) times daily.    [name]  predniSONE (DELTASONE) 5 MG tablet Take 5 mg by mouth daily.     [name] saccharomyces boulardii (FLORASTOR) 250 MG capsule Take 250 mg by mouth 2 (two) times daily.    [name] sulfamethoxazole-trimethoprim (BACTRIM,SEPTRA) 400-80 MG per tablet Take 1 tablet by mouth every Monday, Wednesday, and Friday.     . tacrolimus (PROGRAF) 1 MG capsule Take 2-3 mg by mouth 2 (two) times daily. 3 mg in the morning, 2 mg in the evening    . ondansetron (ZOFRAN ODT) 4 MG disintegrating tablet Take 1 tablet (4 mg total) by mouth every 8 (eight) hours as needed for nausea or vomiting. 20 tablet 0  . promethazine (PHENERGAN) 25 MG tablet Take 25 mg by mouth every 6 (six) hours as needed for nausea or vomiting.    . sucralfate (CARAFATE) 1 GM/10ML suspension Take 10 mLs (1 g total) by mouth  4 (four) times daily -  with meals and at bedtime for 21 days. 420 mL 1    No results found for this or any previous visit (from the past 48 hour(s)). No results found.  Review of Systems  All other systems reviewed and are negative.   Height 5\' 11"  (1.803 m), weight 82.1 kg. Physical Exam  Patient is alert, oriented, no adenopathy, well-dressed, normal affect, normal respiratory effort. Patient has a negative history for gout there is no redness no cellulitis he does have a large olecranon bursal swelling that is about 5 cm in diameter this is tender to palpation no drainage the right upper extremity is neurovascular intact.Heart RRR Lungs Clear Assessment/Plan . Olecranon bursitis, right elbow     Plan: Due to the failure of conservative treatment patient states he would like to proceed with surgical intervention for excision of the olecranon bursa we will plan for surgery as an outpatient discussed risks of wound healing complications as well as risk of recurrence of the olecranon bursitis patient states he understands wished to proceed at this time.   Bevely Palmer Deyani Hegarty, PA 06/23/2020, 6:36 AM

## 2020-06-23 NOTE — Anesthesia Postprocedure Evaluation (Signed)
Anesthesia Post Note  Patient: [name]  Procedure(s) Performed: RIGHT ELBOW EXCISION OLECRANON BURSITIS (Right Elbow)     Patient location during evaluation: PACU Anesthesia Type: General Level of consciousness: awake and alert Pain management: pain level controlled Vital Signs Assessment: post-procedure vital signs reviewed and stable Respiratory status: spontaneous breathing, nonlabored ventilation and respiratory function stable Cardiovascular status: blood pressure returned to baseline and stable Postop Assessment: no apparent nausea or vomiting Anesthetic complications: no   No complications documented.  Last Vitals:  Vitals:   06/23/20 1000 06/23/20 1028  BP: (!) 167/75 (!) 169/83  Pulse: 81 82  Resp: 20 16  Temp:  37 C  SpO2: 97% 98%    Last Pain:  Vitals:   06/23/20 1028  TempSrc:   PainSc: [place]

## 2020-06-24 ENCOUNTER — Encounter (HOSPITAL_BASED_OUTPATIENT_CLINIC_OR_DEPARTMENT_OTHER): Payer: Self-pay | Admitting: Orthopedic Surgery

## 2020-06-25 DIAGNOSIS — M25561 Pain in right knee: Secondary | ICD-10-CM | POA: Diagnosis not present

## 2020-06-25 DIAGNOSIS — F17210 Nicotine dependence, cigarettes, uncomplicated: Secondary | ICD-10-CM | POA: Diagnosis not present

## 2020-06-25 DIAGNOSIS — M545 Low back pain: Secondary | ICD-10-CM | POA: Diagnosis not present

## 2020-06-25 DIAGNOSIS — G894 Chronic pain syndrome: Secondary | ICD-10-CM | POA: Diagnosis not present

## 2020-06-25 DIAGNOSIS — M25562 Pain in left knee: Secondary | ICD-10-CM | POA: Diagnosis not present

## 2020-06-25 DIAGNOSIS — Z79899 Other long term (current) drug therapy: Secondary | ICD-10-CM | POA: Diagnosis not present

## 2020-07-01 DIAGNOSIS — Z961 Presence of intraocular lens: Secondary | ICD-10-CM | POA: Diagnosis not present

## 2020-07-01 DIAGNOSIS — H5340 Unspecified visual field defects: Secondary | ICD-10-CM | POA: Diagnosis not present

## 2020-07-01 DIAGNOSIS — H4720 Unspecified optic atrophy: Secondary | ICD-10-CM | POA: Diagnosis not present

## 2020-07-01 DIAGNOSIS — E103293 Type 1 diabetes mellitus with mild nonproliferative diabetic retinopathy without macular edema, bilateral: Secondary | ICD-10-CM | POA: Diagnosis not present

## 2020-07-01 LAB — HM DIABETES EYE EXAM

## 2020-07-07 ENCOUNTER — Ambulatory Visit (INDEPENDENT_AMBULATORY_CARE_PROVIDER_SITE_OTHER): Payer: Medicare Other | Admitting: Physician Assistant

## 2020-07-07 ENCOUNTER — Encounter: Payer: Self-pay | Admitting: Physician Assistant

## 2020-07-07 ENCOUNTER — Other Ambulatory Visit: Payer: Self-pay

## 2020-07-07 VITALS — Ht 71.0 in | Wt 179.7 lb

## 2020-07-07 DIAGNOSIS — M7021 Olecranon bursitis, right elbow: Secondary | ICD-10-CM

## 2020-07-07 NOTE — Progress Notes (Signed)
Office Visit Note   Patient: [name]           Date of Birth: [birth year]           MRN: [id number] Visit Date: 07/07/2020              Requested by: Marrian Salvage, [place],  [place] [address] PCP: Marrian Salvage, FNP  Chief Complaint  Patient presents with  . Right Elbow - Routine Post Op    06/23/20 Right Elbow excision      HPI: Patient presents today 2 weeks status post excision of right elbow olecranon bursa.  He is accompanied by his wife.  He is on doing well and has no complaints  Assessment & Plan: Visit Diagnoses: No diagnosis found.  Plan: Sutures will be harvested today.  He should work on elbow range of motion.  His wife will stop by the office tomorrow and pick up the compression sleeve for his arm follow-up in 4 weeks  Follow-Up Instructions: No follow-ups on file.   Ortho Exam  Patient is alert, oriented, no adenopathy, well-dressed, normal affect, normal respiratory effort. Focused examination demonstrates well-healed surgical incision.  Mild amount of soft tissue swelling mildly tender no erythema no fluctuance no cellulitis no signs of infection distal CMS is intact  Imaging: No results found. No images are attached to the encounter.  Labs: Lab Results  Component Value Date   HGBA1C 5.4 10/26/2017   HGBA1C 5.4 12/31/2016   HGBA1C 5.5 04/22/2016   CRP 0.7 01/20/2020   CRP 0.7 01/19/2020   CRP 1.1 (H) 01/18/2020   REPTSTATUS 05/17/2017 FINAL 05/14/2017   GRAMSTAIN  05/14/2017    FEW WBC PRESENT, PREDOMINANTLY PMN RARE GRAM POSITIVE COCCI IN PAIRS    CULT NORMAL SKIN FLORA 05/14/2017     Lab Results  Component Value Date   ALBUMIN 3.4 (L) 06/19/2020   ALBUMIN 4.1 02/26/2020   ALBUMIN 3.9 01/16/2020    Lab Results  Component Value Date   MG 2.1 01/20/2020   MG 1.7 01/19/2020   MG 1.7 01/18/2020   No results found for: VD25OH  No results found for: PREALBUMIN CBC EXTENDED Latest Ref  Rng & Units 06/19/2020 02/26/2020 01/20/2020  WBC 4.0 - 10.5 K/uL 7.4 7.0 4.1  RBC 4.22 - 5.81 MIL/uL 5.02 5.94(H) 5.25  HGB 13.0 - 17.0 g/dL 15.3 18.3(H) 16.5  HCT 39 - 52 % 48.5 57.2(H) 49.3  PLT 150 - 400 K/uL 269 271 188  NEUTROABS 1.7 - 7.7 K/uL - 4.5 2.8  LYMPHSABS 0.7 - 4.0 K/uL - 1.4 0.6(L)     Body mass index is 25.06 kg/m.  Orders:  No orders of the defined types were placed in this encounter.  No orders of the defined types were placed in this encounter.    Procedures: No procedures performed  Clinical Data: No additional findings.  ROS:  All other systems negative, except as noted in the HPI. Review of Systems  Objective: Vital Signs: Ht 5\' 11"  (1.803 m)   Wt 179 lb 10.9 oz (81.5 kg)   BMI 25.06 kg/m   Specialty Comments:  No specialty comments available.  PMFS History: Patient Active Problem List   Diagnosis Date Noted  . Olecranon bursitis, right elbow   . GI bleed 01/17/2020  . Acute kidney injury due to COVID-19 ([place]) 01/16/2020  . Dehydration 01/16/2020  . Gastroenteritis due to COVID-19 virus 01/16/2020  . CKD (chronic kidney disease), stage  III 10/26/2017  . Abnormal EKG 10/26/2017  . Hypertension 08/22/2017  . Acute GI bleeding 07/17/2017  . Hypertensive urgency 12/29/2016  . Hematemesis   . Healthcare maintenance 08/26/2016  . Back pain 12/15/2015  . History of diabetes mellitus 02/20/2015  . Abdominal pain 09/10/2014  . Weakness of left upper extremity 07/03/2014  . Impaired mobility and activities of daily living 07/03/2014  . Sternoclavicular joint pain 03/04/2014  . Renal transplant, status post 07/19/2013  . History of pancreas transplant ([place]) 07/19/2013  . Immunosuppressed status ([place]) 06/27/2013  . H/O pancreas transplant ([place]) 06/27/2013  . Diabetic gastroparesis associated with type 1 diabetes mellitus ([place]) 05/08/2013  . BKA stump complication ([place]) 81/85/6314  . S/P bilateral BKA (below knee amputation) ([place]) 08/17/2012    . Metabolic bone disease 97/01/6377  . ESRD (end stage renal disease) ([place]) 11/17/2009  . GERD 03/14/2007  . HLD (hyperlipidemia) 02/22/2007  . Major depressive disorder, recurrent episode ([place]) 02/22/2007  . POST TRAUMATIC STRESS DISORDER 02/22/2007  . 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 Arthritis    "I think I do; just in my fingers & my hands"  . Blood transfusion   . Cataract   . Chronic pain   . Depression    Patient states he has never been depressed.  . Diabetes mellitus without complication ([place])    no since pancreas transplant  . Dialysis patient [facility]) 04/18/12   "[facility]; [place], [place], [place]"  . Edema   . ESRD (end stage renal disease) on dialysis ([place])   . Gastroparesis   . Gastropathy   . GERD (gastroesophageal reflux disease)   . Headache(784.0)   . Hypertension   . MRSA infection    over 10 years ago per patient. in legs    Family History  Problem Relation Age of Onset  . Diabetes Other   . Hypertension Other   . Hypertension Mother   . Diabetes Mother   . Kidney disease Mother   . Diabetes Maternal Grandmother   . Diabetes Paternal Grandmother   . Colon cancer Neg Hx   . Esophageal cancer Neg Hx   . Rectal cancer Neg Hx   . Stomach cancer Neg Hx     Past Surgical History:  Procedure Laterality Date  . AV FISTULA PLACEMENT  08/2011   left upper arm  . BELOW KNEE LEG AMPUTATION  "it's been awhile"   bilaterally  . CATARACT EXTRACTION  ~ 2011   right  . COMBINED KIDNEY-PANCREAS TRANSPLANT  2014  . ESOPHAGOGASTRODUODENOSCOPY N/A 12/30/2016   Procedure: ESOPHAGOGASTRODUODENOSCOPY (EGD);  Surgeon: Carol Ada, MD;  Location: [facility] ENDOSCOPY;  Service: Endoscopy;  Laterality: N/A;  . OLECRANON BURSECTOMY Right 06/23/2020   Procedure: RIGHT ELBOW EXCISION OLECRANON BURSITIS;  Surgeon: Newt Minion, MD;  Location: [facility];  Service:  Orthopedics;  Laterality: Right;   Social History   Occupational History  . Occupation: Disability  Tobacco Use  . Smoking status: Current Some Day Smoker    Types: Cigars  . Smokeless tobacco: Never Used  . Tobacco comment: black and mild  Substance and Sexual Activity  . Alcohol use: No  . Drug use: Not Currently    Frequency: 3.0 times per week    Types: Marijuana    Comment: Occasionally  . Sexual activity: Not Currently

## 2020-07-08 DIAGNOSIS — Z940 Kidney transplant status: Secondary | ICD-10-CM | POA: Diagnosis not present

## 2020-07-10 ENCOUNTER — Telehealth: Payer: Self-pay | Admitting: Orthopedic Surgery

## 2020-07-10 NOTE — Telephone Encounter (Signed)
Patient was called and can come into office and pick up at front desk.

## 2020-07-10 NOTE — Telephone Encounter (Signed)
Patient called. Would like to know when he could pick up the compression sleeve.

## 2020-07-14 DIAGNOSIS — K219 Gastro-esophageal reflux disease without esophagitis: Secondary | ICD-10-CM | POA: Diagnosis not present

## 2020-07-14 DIAGNOSIS — E109 Type 1 diabetes mellitus without complications: Secondary | ICD-10-CM | POA: Diagnosis not present

## 2020-07-14 DIAGNOSIS — K3184 Gastroparesis: Secondary | ICD-10-CM | POA: Diagnosis not present

## 2020-07-14 DIAGNOSIS — Z48288 Encounter for aftercare following multiple organ transplant: Secondary | ICD-10-CM | POA: Diagnosis not present

## 2020-07-14 DIAGNOSIS — I10 Essential (primary) hypertension: Secondary | ICD-10-CM | POA: Diagnosis not present

## 2020-07-14 DIAGNOSIS — Z4822 Encounter for aftercare following kidney transplant: Secondary | ICD-10-CM | POA: Diagnosis not present

## 2020-07-14 DIAGNOSIS — Z79899 Other long term (current) drug therapy: Secondary | ICD-10-CM | POA: Diagnosis not present

## 2020-07-14 DIAGNOSIS — D849 Immunodeficiency, unspecified: Secondary | ICD-10-CM | POA: Diagnosis not present

## 2020-07-14 DIAGNOSIS — Z792 Long term (current) use of antibiotics: Secondary | ICD-10-CM | POA: Diagnosis not present

## 2020-07-14 DIAGNOSIS — Z940 Kidney transplant status: Secondary | ICD-10-CM | POA: Diagnosis not present

## 2020-07-14 DIAGNOSIS — Z7952 Long term (current) use of systemic steroids: Secondary | ICD-10-CM | POA: Diagnosis not present

## 2020-07-14 DIAGNOSIS — Z5181 Encounter for therapeutic drug level monitoring: Secondary | ICD-10-CM | POA: Diagnosis not present

## 2020-07-27 DIAGNOSIS — F17210 Nicotine dependence, cigarettes, uncomplicated: Secondary | ICD-10-CM | POA: Diagnosis not present

## 2020-07-27 DIAGNOSIS — Z79899 Other long term (current) drug therapy: Secondary | ICD-10-CM | POA: Diagnosis not present

## 2020-07-27 DIAGNOSIS — G894 Chronic pain syndrome: Secondary | ICD-10-CM | POA: Diagnosis not present

## 2020-08-04 ENCOUNTER — Ambulatory Visit (INDEPENDENT_AMBULATORY_CARE_PROVIDER_SITE_OTHER): Payer: Medicare Other | Admitting: Orthopedic Surgery

## 2020-08-04 VITALS — Ht 71.0 in | Wt 179.0 lb

## 2020-08-04 DIAGNOSIS — M7021 Olecranon bursitis, right elbow: Secondary | ICD-10-CM

## 2020-08-04 NOTE — Progress Notes (Signed)
Office Visit Note   Patient: [name]           Date of Birth: 02-13-1976           MRN: [id number] Visit Date: 08/04/2020              Requested by: Marrian Salvage, [address] [place],  [place] [address] PCP: Marrian Salvage, FNP  Chief Complaint  Patient presents with  . Right Elbow - Routine Post Op    06/23/20 right excision olecranon bursa       HPI: Patient is a 44 year old gentleman who presents about 6 weeks status post excision right olecranon bursa.  He is currently wearing a compression sleeve.  Patient states he does have some swelling and pain.  Assessment & Plan: Visit Diagnoses: No diagnosis found.  Plan: Patient will continue with scar massage continue with the compression sleeve.  Follow-Up Instructions: No follow-ups on file.   Ortho Exam  Patient is alert, oriented, no adenopathy, well-dressed, normal affect, normal respiratory effort. Examination the right olecranon bursal excision has completely healed the skin moves well there is no cellulitis there is no recurrent effusion no signs of infection.  There is no swelling.  Imaging: No results found. No images are attached to the encounter.  Labs: Lab Results  Component Value Date   HGBA1C 5.4 10/26/2017   HGBA1C 5.4 12/31/2016   HGBA1C 5.5 04/22/2016   CRP 0.7 01/20/2020   CRP 0.7 01/19/2020   CRP 1.1 (H) 01/18/2020   REPTSTATUS 05/17/2017 FINAL 05/14/2017   GRAMSTAIN  05/14/2017    FEW WBC PRESENT, PREDOMINANTLY PMN RARE GRAM POSITIVE COCCI IN PAIRS    CULT NORMAL SKIN FLORA 05/14/2017     Lab Results  Component Value Date   ALBUMIN 3.4 (L) 06/19/2020   ALBUMIN 4.1 02/26/2020   ALBUMIN 3.9 01/16/2020    Lab Results  Component Value Date   MG 2.1 01/20/2020   MG 1.7 01/19/2020   MG 1.7 01/18/2020   No results found for: VD25OH  No results found for: PREALBUMIN CBC EXTENDED Latest Ref Rng & Units 06/19/2020 02/26/2020 01/20/2020  WBC 4.0 - 10.5  K/uL 7.4 7.0 4.1  RBC 4.22 - 5.81 MIL/uL 5.02 5.94(H) 5.25  HGB 13.0 - 17.0 g/dL 15.3 18.3(H) 16.5  HCT 39 - 52 % 48.5 57.2(H) 49.3  PLT 150 - 400 K/uL 269 271 188  NEUTROABS 1.7 - 7.7 K/uL - 4.5 2.8  LYMPHSABS 0.7 - 4.0 K/uL - 1.4 0.6(L)     Body mass index is 24.97 kg/m.  Orders:  No orders of the defined types were placed in this encounter.  No orders of the defined types were placed in this encounter.    Procedures: No procedures performed  Clinical Data: No additional findings.  ROS:  All other systems negative, except as noted in the HPI. Review of Systems  Objective: Vital Signs: Ht 5\' 11"  (1.803 m)   Wt 179 lb (81.2 kg)   BMI 24.97 kg/m   Specialty Comments:  No specialty comments available.  PMFS History: Patient Active Problem List   Diagnosis Date Noted  . Olecranon bursitis, right elbow   . GI bleed 01/17/2020  . Acute kidney injury due to COVID-19 ([place]) 01/16/2020  . Dehydration 01/16/2020  . Gastroenteritis due to COVID-19 virus 01/16/2020  . CKD (chronic kidney disease), stage III 10/26/2017  . Abnormal EKG 10/26/2017  . Hypertension 08/22/2017  . Acute GI bleeding 07/17/2017  .  Hypertensive urgency 12/29/2016  . Hematemesis   . Healthcare maintenance 08/26/2016  . Back pain 12/15/2015  . History of diabetes mellitus 02/20/2015  . Abdominal pain 09/10/2014  . Weakness of left upper extremity 07/03/2014  . Impaired mobility and activities of daily living 07/03/2014  . Sternoclavicular joint pain 03/04/2014  . Renal transplant, status post 07/19/2013  . History of pancreas transplant ([place]) 07/19/2013  . Immunosuppressed status ([place]) 06/27/2013  . H/O pancreas transplant ([place]) 06/27/2013  . Diabetic gastroparesis associated with type 1 diabetes mellitus ([place]) 05/08/2013  . BKA stump complication ([place]) 87/68/1157  . S/P bilateral BKA (below knee amputation) ([place]) 08/17/2012  . Metabolic bone disease 26/20/3559  . ESRD (end stage renal  disease) ([place]) 11/17/2009  . GERD 03/14/2007  . HLD (hyperlipidemia) 02/22/2007  . Major depressive disorder, recurrent episode ([place]) 02/22/2007  . POST TRAUMATIC STRESS DISORDER 02/22/2007  . 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 Arthritis    "I think I do; just in my fingers & my hands"  . Blood transfusion   . Cataract   . Chronic pain   . Depression    Patient states he has never been depressed.  . Diabetes mellitus without complication ([place])    no since pancreas transplant  . Dialysis patient [facility]) 04/18/12   "[facility]; [place], [place], [place]"  . Edema   . ESRD (end stage renal disease) on dialysis ([place])   . Gastroparesis   . Gastropathy   . GERD (gastroesophageal reflux disease)   . Headache(784.0)   . Hypertension   . MRSA infection    over 10 years ago per patient. in legs    Family History  Problem Relation Age of Onset  . Diabetes Other   . Hypertension Other   . Hypertension Mother   . Diabetes Mother   . Kidney disease Mother   . Diabetes Maternal Grandmother   . Diabetes Paternal Grandmother   . Colon cancer Neg Hx   . Esophageal cancer Neg Hx   . Rectal cancer Neg Hx   . Stomach cancer Neg Hx     Past Surgical History:  Procedure Laterality Date  . AV FISTULA PLACEMENT  08/2011   left upper arm  . BELOW KNEE LEG AMPUTATION  "it's been awhile"   bilaterally  . CATARACT EXTRACTION  ~ 2011   right  . COMBINED KIDNEY-PANCREAS TRANSPLANT  2014  . ESOPHAGOGASTRODUODENOSCOPY N/A 12/30/2016   Procedure: ESOPHAGOGASTRODUODENOSCOPY (EGD);  Surgeon: Carol Ada, MD;  [facility] END[facility];  Service: Endoscopy;  Laterality: N/A;  . OLECRANON BURSECTOMY Right 06/23/2020   Procedure: RIGHT ELBOW EXCISION OLECRANON BURSITIS;  Surgeon: Newt Minion, MD;  Location: [facility];  Service: Orthopedics;  Laterality: Right;   Social History   Occupational  History  . Occupation: Disability  Tobacco Use  . Smoking status: Current Some Day Smoker    Types: Cigars  . Smokeless tobacco: Never Used  . Tobacco comment: black and mild  Substance and Sexual Activity  . Alcohol use: No  . Drug use: Not Currently    Frequency: 3.0 times per week    Types: Marijuana    Comment: Occasionally  . Sexual activity: Not Currently

## 2020-08-11 DIAGNOSIS — Z940 Kidney transplant status: Secondary | ICD-10-CM | POA: Diagnosis not present

## 2020-08-18 ENCOUNTER — Encounter: Payer: Self-pay | Admitting: Orthopedic Surgery

## 2020-08-19 DIAGNOSIS — N183 Chronic kidney disease, stage 3 unspecified: Secondary | ICD-10-CM | POA: Diagnosis not present

## 2020-08-19 DIAGNOSIS — D751 Secondary polycythemia: Secondary | ICD-10-CM | POA: Diagnosis not present

## 2020-08-19 DIAGNOSIS — Z940 Kidney transplant status: Secondary | ICD-10-CM | POA: Diagnosis not present

## 2020-08-19 DIAGNOSIS — I129 Hypertensive chronic kidney disease with stage 1 through stage 4 chronic kidney disease, or unspecified chronic kidney disease: Secondary | ICD-10-CM | POA: Diagnosis not present

## 2020-08-19 DIAGNOSIS — Z79899 Other long term (current) drug therapy: Secondary | ICD-10-CM | POA: Diagnosis not present

## 2020-08-26 DIAGNOSIS — Z79899 Other long term (current) drug therapy: Secondary | ICD-10-CM | POA: Diagnosis not present

## 2020-08-26 DIAGNOSIS — G894 Chronic pain syndrome: Secondary | ICD-10-CM | POA: Diagnosis not present

## 2020-08-26 DIAGNOSIS — F17210 Nicotine dependence, cigarettes, uncomplicated: Secondary | ICD-10-CM | POA: Diagnosis not present

## 2020-09-08 DIAGNOSIS — Z940 Kidney transplant status: Secondary | ICD-10-CM | POA: Diagnosis not present

## 2020-09-25 DIAGNOSIS — F17210 Nicotine dependence, cigarettes, uncomplicated: Secondary | ICD-10-CM | POA: Diagnosis not present

## 2020-09-25 DIAGNOSIS — G894 Chronic pain syndrome: Secondary | ICD-10-CM | POA: Diagnosis not present

## 2020-09-25 DIAGNOSIS — Z79899 Other long term (current) drug therapy: Secondary | ICD-10-CM | POA: Diagnosis not present

## 2020-10-06 DIAGNOSIS — Z940 Kidney transplant status: Secondary | ICD-10-CM | POA: Diagnosis not present

## 2020-10-15 ENCOUNTER — Ambulatory Visit (INDEPENDENT_AMBULATORY_CARE_PROVIDER_SITE_OTHER): Payer: Medicare Other | Admitting: Orthopedic Surgery

## 2020-10-15 ENCOUNTER — Encounter: Payer: Self-pay | Admitting: Physician Assistant

## 2020-10-15 DIAGNOSIS — M7021 Olecranon bursitis, right elbow: Secondary | ICD-10-CM | POA: Diagnosis not present

## 2020-10-15 MED ORDER — DOXYCYCLINE HYCLATE 100 MG PO TABS: 100.0000 mg | ORAL_TABLET | Freq: Every day | ORAL | 0 refills | Status: DC

## 2020-10-15 NOTE — Progress Notes (Signed)
Office Visit Note   Patient: [name]           Date of Birth: 1976-05-22           MRN: [id number] Visit Date: 10/15/2020              Requested by: Marrian Salvage, [place],  [place] [address] PCP: Marrian Salvage, FNP  Chief Complaint  Patient presents with  . Right Elbow - Pain      HPI: Patient is a 44 year old gentleman who presents 6 months status post excision of olecranon bursa.  The cultures were negative.  Patient states he has had acute pain and swelling in the right elbow.  Assessment & Plan: Visit Diagnoses:  1. Olecranon bursitis, right elbow     Plan: Fluid was drained this was clear patient started on doxycycline once a day follow-up on Monday.  Continue with compression.  Follow-Up Instructions: Return in about 1 week (around 10/22/2020).   Ortho Exam  Patient is alert, oriented, no adenopathy, well-dressed, normal affect, normal respiratory effort. Examination patient has a fluctuant olecranon bursa right elbow there is warmth there is no redness no cellulitis.  After informed consent this was sterilely prepped with Betadine.  Local skin anesthesia with 1 cc of 1% lidocaine plain an 18-gauge needle was used to try to aspirate fluid and very small amount of fluid was able to be expressed.  A 15 blade knife was then used to make a small stab incision and clear serosanguineous fluid was drained.  No purulence the fluid was clear.  We will start him on doxycycline once a day and reevaluate on Monday.  Imaging: No results found. No images are attached to the encounter.  Labs: Lab Results  Component Value Date   HGBA1C 5.4 10/26/2017   HGBA1C 5.4 12/31/2016   HGBA1C 5.5 04/22/2016   CRP 0.7 01/20/2020   CRP 0.7 01/19/2020   CRP 1.1 (H) 01/18/2020   REPTSTATUS 05/17/2017 FINAL 05/14/2017   GRAMSTAIN  05/14/2017    FEW WBC PRESENT, PREDOMINANTLY PMN RARE GRAM POSITIVE COCCI IN PAIRS    CULT NORMAL SKIN FLORA  05/14/2017     Lab Results  Component Value Date   ALBUMIN 3.4 (L) 06/19/2020   ALBUMIN 4.1 02/26/2020   ALBUMIN 3.9 01/16/2020    Lab Results  Component Value Date   MG 2.1 01/20/2020   MG 1.7 01/19/2020   MG 1.7 01/18/2020   No results found for: VD25OH  No results found for: PREALBUMIN CBC EXTENDED Latest Ref Rng & Units 06/19/2020 02/26/2020 01/20/2020  WBC 4.0 - 10.5 K/uL 7.4 7.0 4.1  RBC 4.22 - 5.81 MIL/uL 5.02 5.94(H) 5.25  HGB 13.0 - 17.0 g/dL 15.3 18.3(H) 16.5  HCT 39 - 52 % 48.5 57.2(H) 49.3  PLT 150 - 400 K/uL 269 271 188  NEUTROABS 1.7 - 7.7 K/uL - 4.5 2.8  LYMPHSABS 0.7 - 4.0 K/uL - 1.4 0.6(L)     There is no height or weight on file to calculate BMI.  Orders:  No orders of the defined types were placed in this encounter.  Meds ordered this encounter  Medications  . doxycycline (VIBRA-TABS) 100 MG tablet    Sig: Take 1 tablet (100 mg total) by mouth daily.    Dispense:  60 tablet    Refill:  0     Procedures: No procedures performed  Clinical Data: No additional findings.  ROS:  All other systems negative,  except as noted in the HPI. Review of Systems  Objective: Vital Signs: There were no vitals taken for this visit.  Specialty Comments:  No specialty comments available.  PMFS History: Patient Active Problem List   Diagnosis Date Noted  . Olecranon bursitis, right elbow   . GI bleed 01/17/2020  . Acute kidney injury due to COVID-19 ([place]) 01/16/2020  . Dehydration 01/16/2020  . Gastroenteritis due to COVID-19 virus 01/16/2020  . CKD (chronic kidney disease), stage III ([place]) 10/26/2017  . Abnormal EKG 10/26/2017  . Hypertension 08/22/2017  . Acute GI bleeding 07/17/2017  . Hypertensive urgency 12/29/2016  . Hematemesis   . Healthcare maintenance 08/26/2016  . Back pain 12/15/2015  . History of diabetes mellitus 02/20/2015  . Abdominal pain 09/10/2014  . Weakness of left upper extremity 07/03/2014  . Impaired mobility and  activities of daily living 07/03/2014  . Sternoclavicular joint pain 03/04/2014  . Renal transplant, status post 07/19/2013  . History of pancreas transplant ([place]) 07/19/2013  . Immunosuppressed status ([place]) 06/27/2013  . H/O pancreas transplant ([place]) 06/27/2013  . Diabetic gastroparesis associated with type 1 diabetes mellitus ([place]) 05/08/2013  . BKA stump complication ([place]) 80/99/8338  . S/P bilateral BKA (below knee amputation) ([place]) 08/17/2012  . Metabolic bone disease 25/04/3975  . ESRD (end stage renal disease) ([place]) 11/17/2009  . GERD 03/14/2007  . HLD (hyperlipidemia) 02/22/2007  . Major depressive disorder, recurrent episode ([place]) 02/22/2007  . POST TRAUMATIC STRESS DISORDER 02/22/2007  . 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 Arthritis    "I think I do; just in my fingers & my hands"  . Blood transfusion   . Cataract   . Chronic pain   . Depression    Patient states he has never been depressed.  . Diabetes mellitus without complication ([place])    no since pancreas transplant  . Dialysis patient [facility]) 04/18/12   "[facility]; [place], [place], [place]"  . Edema   . ESRD (end stage renal disease) on dialysis ([place])   . Gastroparesis   . Gastropathy   . GERD (gastroesophageal reflux disease)   . Headache(784.0)   . Hypertension   . MRSA infection    over 10 years ago per patient. in legs    Family History  Problem Relation Age of Onset  . Diabetes Other   . Hypertension Other   . Hypertension Mother   . Diabetes Mother   . Kidney disease Mother   . Diabetes Maternal Grandmother   . Diabetes Paternal Grandmother   . Colon cancer Neg Hx   . Esophageal cancer Neg Hx   . Rectal cancer Neg Hx   . Stomach cancer Neg Hx     Past Surgical History:  Procedure Laterality Date  . AV FISTULA PLACEMENT  08/2011   left upper arm  . BELOW KNEE LEG AMPUTATION  "it's been awhile"    bilaterally  . CATARACT EXTRACTION  ~ 2011   right  . COMBINED KIDNEY-PANCREAS TRANSPLANT  2014  . ESOPHAGOGASTRODUODENOSCOPY N/A 12/30/2016   Procedure: ESOPHAGOGASTRODUODENOSCOPY (EGD);  Surgeon: Carol Ada, MD;  Location: [facility] ENDOSCOPY;  Service: Endoscopy;  Laterality: N/A;  . OLECRANON BURSECTOMY Right 06/23/2020   Procedure: RIGHT ELBOW EXCISION OLECRANON BURSITIS;  Surgeon: Newt Minion, MD;  Location: [facility];  Service: Orthopedics;  Laterality: Right;   Social History   Occupational History  . Occupation: Disability  Tobacco Use  . Smoking status: Current Some Day Smoker    Types: Cigars  . Smokeless tobacco: Never Used  . Tobacco comment: black and mild  Substance and Sexual Activity  . Alcohol use: No  . Drug use: Not Currently    Frequency: 3.0 times per week    Types: Marijuana    Comment: Occasionally  . Sexual activity: Not Currently

## 2020-10-19 ENCOUNTER — Ambulatory Visit: Payer: Medicare Other | Admitting: Orthopedic Surgery

## 2020-10-23 ENCOUNTER — Encounter: Payer: Self-pay | Admitting: Physician Assistant

## 2020-10-23 ENCOUNTER — Ambulatory Visit (INDEPENDENT_AMBULATORY_CARE_PROVIDER_SITE_OTHER): Payer: Medicare Other | Admitting: Physician Assistant

## 2020-10-23 ENCOUNTER — Telehealth: Payer: Self-pay

## 2020-10-23 ENCOUNTER — Other Ambulatory Visit: Payer: Self-pay

## 2020-10-23 DIAGNOSIS — M7021 Olecranon bursitis, right elbow: Secondary | ICD-10-CM

## 2020-10-23 MED ORDER — LIDOCAINE HCL 1 % IJ SOLN
2.0000 mL | INTRAMUSCULAR | Status: AC | PRN
  Administered 2020-10-23: 2 mL

## 2020-10-23 NOTE — Addendum Note (Signed)
Addended by: Jacklyn Shell on: 10/23/2020 02:29 PM   Modules accepted: Orders

## 2020-10-23 NOTE — Progress Notes (Signed)
Office Visit Note   Patient: [name]           Date of Birth: [birth year]           MRN: [id number] Visit Date: 10/23/2020              Requested by: Marrian Salvage, [place],  [place] [address] PCP: Marrian Salvage, FNP  Chief Complaint  Patient presents with  . Right Elbow - Follow-up      HPI: Presents today for an unexpected visit.  He is status post excision olecranon bursa approximately 6 months ago.  He saw Dr. Sharol Given about 8 days ago because he had increased swelling in his elbow.  At that time his elbow was attempted aspirated but could not get fluid out a small incision was made and he did produce some fluid.  He was to see Dr. Sharol Given next week.  He comes in today because of severe swelling and fluid accumulation in the elbow.  He denies any fever or chills.  The pressure from this is quite painful to him.  He is currently taking doxycycline.  Assessment & Plan: Visit Diagnoses: No diagnosis found.  Plan: Continue antibiotic and his appointment with Dr. Sharol Given on Tuesday.  We will provide him with a sling for some immobilization.  He should keep the compression wrap on it and may replace it with his compression sleeve in 24 hours.  He understands if he had increased fever chills or any other signs or symptoms he is to go to the emergency room this weekend.  Follow-Up Instructions: No follow-ups on file.   Ortho Exam  Patient is alert, oriented, no adenopathy, well-dressed, normal affect, normal respiratory effort. Right elbow some mild triceps weakness.  He has significant effusion in the elbow no cellulitis.   Imaging: No results found. No images are attached to the encounter.  Labs: Lab Results  Component Value Date   HGBA1C 5.4 10/26/2017   HGBA1C 5.4 12/31/2016   HGBA1C 5.5 04/22/2016   CRP 0.7 01/20/2020   CRP 0.7 01/19/2020   CRP 1.1 (H) 01/18/2020   REPTSTATUS 05/17/2017 FINAL 05/14/2017   GRAMSTAIN  05/14/2017     FEW WBC PRESENT, PREDOMINANTLY PMN RARE GRAM POSITIVE COCCI IN PAIRS    CULT NORMAL SKIN FLORA 05/14/2017     Lab Results  Component Value Date   ALBUMIN 3.4 (L) 06/19/2020   ALBUMIN 4.1 02/26/2020   ALBUMIN 3.9 01/16/2020    Lab Results  Component Value Date   MG 2.1 01/20/2020   MG 1.7 01/19/2020   MG 1.7 01/18/2020   No results found for: VD25OH  No results found for: PREALBUMIN CBC EXTENDED Latest Ref Rng & Units 06/19/2020 02/26/2020 01/20/2020  WBC 4.0 - 10.5 K/uL 7.4 7.0 4.1  RBC 4.22 - 5.81 MIL/uL 5.02 5.94(H) 5.25  HGB 13.0 - 17.0 g/dL 15.3 18.3(H) 16.5  HCT 39 - 52 % 48.5 57.2(H) 49.3  PLT 150 - 400 K/uL 269 271 188  NEUTROABS 1.7 - 7.7 K/uL - 4.5 2.8  LYMPHSABS 0.7 - 4.0 K/uL - 1.4 0.6(L)     There is no height or weight on file to calculate BMI.  Orders:  No orders of the defined types were placed in this encounter.  No orders of the defined types were placed in this encounter.    Procedures: Medium Joint Inj: R olecranon bursa on 10/23/2020 2:00 PM Indications: pain and diagnostic evaluation Details: 18 G  1.5 in needle Medications: 2 mL lidocaine 1 % Aspirate: cloudy Outcome: tolerated well, no immediate complications Procedure, treatment alternatives, risks and benefits explained, specific risks discussed. Consent was given by the patient.    80 cc of cloudy serous blood-tinged fluid was removed.  This was sent for culture.  Clinical Data: No additional findings.  ROS:  All other systems negative, except as noted in the HPI. Review of Systems  Objective: Vital Signs: There were no vitals taken for this visit.  Specialty Comments:  No specialty comments available.  PMFS History: Patient Active Problem List   Diagnosis Date Noted  . Olecranon bursitis, right elbow   . GI bleed 01/17/2020  . Acute kidney injury due to COVID-19 ([place]) 01/16/2020  . Dehydration 01/16/2020  . Gastroenteritis due to COVID-19 virus 01/16/2020  . CKD  (chronic kidney disease), stage III ([place]) 10/26/2017  . Abnormal EKG 10/26/2017  . Hypertension 08/22/2017  . Acute GI bleeding 07/17/2017  . Hypertensive urgency 12/29/2016  . Hematemesis   . Healthcare maintenance 08/26/2016  . Back pain 12/15/2015  . History of diabetes mellitus 02/20/2015  . Abdominal pain 09/10/2014  . Weakness of left upper extremity 07/03/2014  . Impaired mobility and activities of daily living 07/03/2014  . Sternoclavicular joint pain 03/04/2014  . Renal transplant, status post 07/19/2013  . History of pancreas transplant ([place]) 07/19/2013  . Immunosuppressed status ([place]) 06/27/2013  . H/O pancreas transplant ([place]) 06/27/2013  . Diabetic gastroparesis associated with type 1 diabetes mellitus ([place]) 05/08/2013  . BKA stump complication ([place]) 67/67/2094  . S/P bilateral BKA (below knee amputation) ([place]) 08/17/2012  . Metabolic bone disease 70/96/2836  . ESRD (end stage renal disease) ([place]) 11/17/2009  . GERD 03/14/2007  . HLD (hyperlipidemia) 02/22/2007  . Major depressive disorder, recurrent episode ([place]) 02/22/2007  . POST TRAUMATIC STRESS DISORDER 02/22/2007  . 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 Arthritis    "I think I do; just in my fingers & my hands"  . Blood transfusion   . Cataract   . Chronic pain   . Depression    Patient states he has never been depressed.  . Diabetes mellitus without complication ([place])    no since pancreas transplant  . Dialysis patient [facility]) 04/18/12   "[facility]; T[place], [place], [place]"  . Edema   . ESRD (end stage renal disease) on dialysis ([place])   . Gastroparesis   . Gastropathy   . GERD (gastroesophageal reflux disease)   . Headache(784.0)   . Hypertension   . MRSA infection    over 10 years ago per patient. in legs    Family History  Problem Relation Age of Onset  . Diabetes Other   . Hypertension Other   .  Hypertension Mother   . Diabetes Mother   . Kidney disease Mother   . Diabetes Maternal Grandmother   . Diabetes Paternal Grandmother   . Colon cancer Neg Hx   . Esophageal cancer Neg Hx   . Rectal cancer Neg Hx   . Stomach cancer Neg Hx     Past Surgical History:  Procedure Laterality Date  . AV FISTULA PLACEMENT  08/2011   left upper arm  . BELOW KNEE LEG AMPUTATION  "it's been awhile"   bilaterally  . CATARACT EXTRACTION  ~ 2011   right  . COMBINED KIDNEY-PANCREAS TRANSPLANT  2014  . ESOPHAGOGASTRODUODENOSCOPY N/A 12/30/2016  Procedure: ESOPHAGOGASTRODUODENOSCOPY (EGD);  Surgeon: Carol Ada, MD;  Location[facility] ENDOSCOPY;  Service: Endoscopy;  Laterality: N/A;  . OLECRANON BURSECTOMY Right 06/23/2020   Procedure: RIGHT ELBOW EXCISION OLECRANON BURSITIS;  Surgeon: Newt Minion, MD;  Location: [facility];  Service: Orthopedics;  Laterality: Right;   Social History   Occupational History  . Occupation: Disability  Tobacco Use  . Smoking status: Current Some Day Smoker    Types: Cigars  . Smokeless tobacco: Never Used  . Tobacco comment: black and mild  Substance and Sexual Activity  . Alcohol use: No  . Drug use: Not Currently    Frequency: 3.0 times per week    Types: Marijuana    Comment: Occasionally  . Sexual activity: Not Currently

## 2020-10-23 NOTE — Telephone Encounter (Signed)
Called pt appt today at 1:15

## 2020-10-23 NOTE — Telephone Encounter (Signed)
Patient called he has a appt. Monday but he said he cant wait that long he stated his elbow is swollen and is wondering if he can be worked into either [employer] or erins schedule today.Call [phone]

## 2020-10-25 LAB — SYNOVIAL FLUID ANALYSIS, COMPLETE
Basophils, %: 0 %
Eosinophils-Synovial: 0 % (ref 0–2)
Lymphocytes-Synovial Fld: 0 % (ref 0–74)
Monocyte/Macrophage: 8 % (ref 0–69)
Neutrophil, Synovial: 92 % — ABNORMAL HIGH (ref 0–24)
Synoviocytes, %: 0 % (ref 0–15)
WBC, Synovial: 5060 cells/uL — ABNORMAL HIGH (ref <150)

## 2020-10-25 LAB — GRAM STAIN
MICRO NUMBER:: 11136832
SPECIMEN QUALITY:: ADEQUATE

## 2020-10-25 LAB — URIC ACID: Uric Acid, Serum: 5.6 mg/dL (ref 4.0–8.0)

## 2020-10-26 DIAGNOSIS — Z79899 Other long term (current) drug therapy: Secondary | ICD-10-CM | POA: Diagnosis not present

## 2020-10-26 DIAGNOSIS — F17210 Nicotine dependence, cigarettes, uncomplicated: Secondary | ICD-10-CM | POA: Diagnosis not present

## 2020-10-26 DIAGNOSIS — Z23 Encounter for immunization: Secondary | ICD-10-CM | POA: Diagnosis not present

## 2020-10-26 DIAGNOSIS — G894 Chronic pain syndrome: Secondary | ICD-10-CM | POA: Diagnosis not present

## 2020-10-27 ENCOUNTER — Ambulatory Visit (INDEPENDENT_AMBULATORY_CARE_PROVIDER_SITE_OTHER): Payer: Medicare Other | Admitting: Orthopedic Surgery

## 2020-10-27 ENCOUNTER — Encounter: Payer: Self-pay | Admitting: Orthopedic Surgery

## 2020-10-27 VITALS — Ht 71.0 in | Wt 179.0 lb

## 2020-10-27 DIAGNOSIS — M7021 Olecranon bursitis, right elbow: Secondary | ICD-10-CM | POA: Diagnosis not present

## 2020-10-27 MED ORDER — METHYLPREDNISOLONE ACETATE 40 MG/ML IJ SUSP
40.0000 mg | INTRAMUSCULAR | Status: AC | PRN
  Administered 2020-10-27: 40 mg via INTRA_ARTICULAR

## 2020-10-27 MED ORDER — LIDOCAINE HCL 1 % IJ SOLN
2.0000 mL | INTRAMUSCULAR | Status: AC | PRN
  Administered 2020-10-27: 2 mL

## 2020-10-27 NOTE — Progress Notes (Signed)
Office Visit Note   Patient: [name]           Date of Birth: 1976-03-14           MRN: [id number] Visit Date: 10/27/2020              Requested by: Marrian Salvage, [place] [place],  [place] [address] PCP: Marrian Salvage, FNP  Chief Complaint  Patient presents with  . Right Elbow - Follow-up    10/23/20 s/p aspiration       HPI: Patient is a 44 year old gentleman who presents with recurrent olecranon bursitis.  He underwent excision of the bursa approximately 4 months ago.  Patient last week had 80 cc aspirated presents with recurrent swelling.  Assessment & Plan: Visit Diagnoses:  1. Olecranon bursitis, right elbow     Plan: Olecranon bursa was aspirated of clear serosanguineous fluid compression wrap was applied recommend he continue with the compression sleeve follow-up if there is recurrence.  Follow-Up Instructions: Return if symptoms worsen or fail to improve.   Ortho Exam  Patient is alert, oriented, no adenopathy, well-dressed, normal affect, normal respiratory effort. Examination patient has recurrent olecranon bursal swelling.  The elbow has no redness no warmth no tenderness to palpation.  Imaging: No results found. No images are attached to the encounter.  Labs: Lab Results  Component Value Date   HGBA1C 5.4 10/26/2017   HGBA1C 5.4 12/31/2016   HGBA1C 5.5 04/22/2016   CRP 0.7 01/20/2020   CRP 0.7 01/19/2020   CRP 1.1 (H) 01/18/2020   LABURIC 5.6 10/23/2020   REPTSTATUS 05/17/2017 FINAL 05/14/2017   GRAMSTAIN  05/14/2017    FEW WBC PRESENT, PREDOMINANTLY PMN RARE GRAM POSITIVE COCCI IN PAIRS    CULT NORMAL SKIN FLORA 05/14/2017     Lab Results  Component Value Date   ALBUMIN 3.4 (L) 06/19/2020   ALBUMIN 4.1 02/26/2020   ALBUMIN 3.9 01/16/2020   LABURIC 5.6 10/23/2020    Lab Results  Component Value Date   MG 2.1 01/20/2020   MG 1.7 01/19/2020   MG 1.7 01/18/2020   No results found for:  VD25OH  No results found for: PREALBUMIN CBC EXTENDED Latest Ref Rng & Units 06/19/2020 02/26/2020 01/20/2020  WBC 4.0 - 10.5 K/uL 7.4 7.0 4.1  RBC 4.22 - 5.81 MIL/uL 5.02 5.94(H) 5.25  HGB 13.0 - 17.0 g/dL 15.3 18.3(H) 16.5  HCT 39 - 52 % 48.5 57.2(H) 49.3  PLT 150 - 400 K/uL 269 271 188  NEUTROABS 1.7 - 7.7 K/uL - 4.5 2.8  LYMPHSABS 0.7 - 4.0 K/uL - 1.4 0.6(L)     Body mass index is 24.97 kg/m.  Orders:  No orders of the defined types were placed in this encounter.  No orders of the defined types were placed in this encounter.    Procedures: Medium Joint Inj: R olecranon bursa on 10/27/2020 5:15 PM Indications: pain and diagnostic evaluation Details: 22 G 1.5 in needle, posterior approach Medications: 2 mL lidocaine 1 %; 40 mg methylPREDNISolone acetate 40 MG/ML Aspirate: 50 mL blood-tinged Outcome: tolerated well, no immediate complications Procedure, treatment alternatives, risks and benefits explained, specific risks discussed. Consent was given by the patient. Immediately prior to procedure a time out was called to verify the correct patient, procedure, equipment, support staff and site/side marked as required. Patient was prepped and draped in the usual sterile fashion.      Clinical Data: No additional findings.  ROS:  All other systems  negative, except as noted in the HPI. Review of Systems  Objective: Vital Signs: Ht 5\' 11"  (1.803 m)   Wt 179 lb (81.2 kg)   BMI 24.97 kg/m   Specialty Comments:  No specialty comments available.  PMFS History: Patient Active Problem List   Diagnosis Date Noted  . Olecranon bursitis, right elbow   . GI bleed 01/17/2020  . Acute kidney injury due to COVID-19 ([place]) 01/16/2020  . Dehydration 01/16/2020  . Gastroenteritis due to COVID-19 virus 01/16/2020  . CKD (chronic kidney disease), stage III ([place]) 10/26/2017  . Abnormal EKG 10/26/2017  . Hypertension 08/22/2017  . Acute GI bleeding 07/17/2017  . Hypertensive urgency  12/29/2016  . Hematemesis   . Healthcare maintenance 08/26/2016  . Back pain 12/15/2015  . History of diabetes mellitus 02/20/2015  . Abdominal pain 09/10/2014  . Weakness of left upper extremity 07/03/2014  . Impaired mobility and activities of daily living 07/03/2014  . Sternoclavicular joint pain 03/04/2014  . Renal transplant, status post 07/19/2013  . History of pancreas transplant ([place]) 07/19/2013  . Immunosuppressed status ([place]) 06/27/2013  . H/O pancreas transplant ([place]) 06/27/2013  . Diabetic gastroparesis associated with type 1 diabetes mellitus ([place]) 05/08/2013  . BKA stump complication ([place]) 37/03/8888  . S/P bilateral BKA (below knee amputation) ([place]) 08/17/2012  . Metabolic bone disease 16/94/5038  . ESRD (end stage renal disease) ([place]) 11/17/2009  . GERD 03/14/2007  . HLD (hyperlipidemia) 02/22/2007  . Major depressive disorder, recurrent episode ([place]) 02/22/2007  . POST TRAUMATIC STRESS DISORDER 02/22/2007  . 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 Arthritis    "I think I do; just in my fingers & my hands"  . Blood transfusion   . Cataract   . Chronic pain   . Depression    Patient states he has never been depressed.  . Diabetes mellitus without complication ([place])    no since pancreas transplant  . Dialysis patient [facility]) 04/18/12   "[facility]; [place], [place], [place]"  . Edema   . ESRD (end stage renal disease) on dialysis ([place])   . Gastroparesis   . Gastropathy   . GERD (gastroesophageal reflux disease)   . Headache(784.0)   . Hypertension   . MRSA infection    over 10 years ago per patient. in legs    Family History  Problem Relation Age of Onset  . Diabetes Other   . Hypertension Other   . Hypertension Mother   . Diabetes Mother   . Kidney disease Mother   . Diabetes Maternal Grandmother   . Diabetes Paternal Grandmother   . Colon cancer Neg Hx   .  Esophageal cancer Neg Hx   . Rectal cancer Neg Hx   . Stomach cancer Neg Hx     Past Surgical History:  Procedure Laterality Date  . AV FISTULA PLACEMENT  08/2011   left upper arm  . BELOW KNEE LEG AMPUTATION  "it's been awhile"   bilaterally  . CATARACT EXTRACTION  ~ 2011   right  . COMBINED KIDNEY-PANCREAS TRANSPLANT  2014  . ESOPHAGOGASTRODUODENOSCOPY N/A 12/30/2016   Procedure: ESOPHAGOGASTRODUODENOSCOPY (EGD);  Surgeon: Carol Ada, MD;  Location[facility] ENDOSCOPY;  Service: Endoscopy;  Laterality: N/A;  . OLECRANON BURSECTOMY Right 06/23/2020   Procedure: RIGHT ELBOW EXCISION OLECRANON BURSITIS;  Surgeon: Newt Minion, MD;  Location: [facility];  Service: Orthopedics;  Laterality: Right;  Social History   Occupational History  . Occupation: Disability  Tobacco Use  . Smoking status: Current Some Day Smoker    Types: Cigars  . Smokeless tobacco: Never Used  . Tobacco comment: black and mild  Substance and Sexual Activity  . Alcohol use: No  . Drug use: Not Currently    Frequency: 3.0 times per week    Types: Marijuana    Comment: Occasionally  . Sexual activity: Not Currently

## 2020-10-29 LAB — BODY FLUID CULTURE

## 2020-11-03 DIAGNOSIS — Z940 Kidney transplant status: Secondary | ICD-10-CM | POA: Diagnosis not present

## 2020-11-17 DIAGNOSIS — Z940 Kidney transplant status: Secondary | ICD-10-CM | POA: Diagnosis not present

## 2020-11-17 DIAGNOSIS — N183 Chronic kidney disease, stage 3 unspecified: Secondary | ICD-10-CM | POA: Diagnosis not present

## 2020-11-17 DIAGNOSIS — R7309 Other abnormal glucose: Secondary | ICD-10-CM | POA: Diagnosis not present

## 2020-11-17 DIAGNOSIS — Z79899 Other long term (current) drug therapy: Secondary | ICD-10-CM | POA: Diagnosis not present

## 2020-11-17 DIAGNOSIS — D751 Secondary polycythemia: Secondary | ICD-10-CM | POA: Diagnosis not present

## 2020-11-17 DIAGNOSIS — I129 Hypertensive chronic kidney disease with stage 1 through stage 4 chronic kidney disease, or unspecified chronic kidney disease: Secondary | ICD-10-CM | POA: Diagnosis not present

## 2020-11-25 DIAGNOSIS — G894 Chronic pain syndrome: Secondary | ICD-10-CM | POA: Diagnosis not present

## 2020-11-25 DIAGNOSIS — E559 Vitamin D deficiency, unspecified: Secondary | ICD-10-CM | POA: Diagnosis not present

## 2020-11-25 DIAGNOSIS — F17210 Nicotine dependence, cigarettes, uncomplicated: Secondary | ICD-10-CM | POA: Diagnosis not present

## 2020-11-25 DIAGNOSIS — G546 Phantom limb syndrome with pain: Secondary | ICD-10-CM | POA: Diagnosis not present

## 2020-11-25 DIAGNOSIS — Z79899 Other long term (current) drug therapy: Secondary | ICD-10-CM | POA: Diagnosis not present

## 2020-11-25 DIAGNOSIS — M129 Arthropathy, unspecified: Secondary | ICD-10-CM | POA: Diagnosis not present

## 2020-12-01 DIAGNOSIS — Z940 Kidney transplant status: Secondary | ICD-10-CM | POA: Diagnosis not present

## 2020-12-24 DIAGNOSIS — Z79899 Other long term (current) drug therapy: Secondary | ICD-10-CM | POA: Diagnosis not present

## 2020-12-24 DIAGNOSIS — E559 Vitamin D deficiency, unspecified: Secondary | ICD-10-CM | POA: Diagnosis not present

## 2020-12-24 DIAGNOSIS — G546 Phantom limb syndrome with pain: Secondary | ICD-10-CM | POA: Diagnosis not present

## 2020-12-24 DIAGNOSIS — G894 Chronic pain syndrome: Secondary | ICD-10-CM | POA: Diagnosis not present

## 2020-12-24 DIAGNOSIS — F17210 Nicotine dependence, cigarettes, uncomplicated: Secondary | ICD-10-CM | POA: Diagnosis not present

## 2021-01-05 DIAGNOSIS — Z940 Kidney transplant status: Secondary | ICD-10-CM | POA: Diagnosis not present

## 2021-01-25 DIAGNOSIS — G546 Phantom limb syndrome with pain: Secondary | ICD-10-CM | POA: Diagnosis not present

## 2021-01-25 DIAGNOSIS — Z79899 Other long term (current) drug therapy: Secondary | ICD-10-CM | POA: Diagnosis not present

## 2021-01-25 DIAGNOSIS — G894 Chronic pain syndrome: Secondary | ICD-10-CM | POA: Diagnosis not present

## 2021-01-25 DIAGNOSIS — E559 Vitamin D deficiency, unspecified: Secondary | ICD-10-CM | POA: Diagnosis not present

## 2021-02-02 DIAGNOSIS — Z940 Kidney transplant status: Secondary | ICD-10-CM | POA: Diagnosis not present

## 2021-02-12 DIAGNOSIS — Z940 Kidney transplant status: Secondary | ICD-10-CM | POA: Diagnosis not present

## 2021-02-12 DIAGNOSIS — N183 Chronic kidney disease, stage 3 unspecified: Secondary | ICD-10-CM | POA: Diagnosis not present

## 2021-02-12 DIAGNOSIS — Z79899 Other long term (current) drug therapy: Secondary | ICD-10-CM | POA: Diagnosis not present

## 2021-02-12 DIAGNOSIS — D751 Secondary polycythemia: Secondary | ICD-10-CM | POA: Diagnosis not present

## 2021-02-12 DIAGNOSIS — R7309 Other abnormal glucose: Secondary | ICD-10-CM | POA: Diagnosis not present

## 2021-02-12 DIAGNOSIS — I129 Hypertensive chronic kidney disease with stage 1 through stage 4 chronic kidney disease, or unspecified chronic kidney disease: Secondary | ICD-10-CM | POA: Diagnosis not present

## 2021-02-12 DIAGNOSIS — Z9483 Pancreas transplant status: Secondary | ICD-10-CM | POA: Diagnosis not present

## 2021-02-23 DIAGNOSIS — G546 Phantom limb syndrome with pain: Secondary | ICD-10-CM | POA: Diagnosis not present

## 2021-02-23 DIAGNOSIS — Z79899 Other long term (current) drug therapy: Secondary | ICD-10-CM | POA: Diagnosis not present

## 2021-02-23 DIAGNOSIS — G894 Chronic pain syndrome: Secondary | ICD-10-CM | POA: Diagnosis not present

## 2021-03-04 DIAGNOSIS — Z940 Kidney transplant status: Secondary | ICD-10-CM | POA: Diagnosis not present

## 2021-03-10 DIAGNOSIS — Z961 Presence of intraocular lens: Secondary | ICD-10-CM | POA: Diagnosis not present

## 2021-03-10 DIAGNOSIS — H47013 Ischemic optic neuropathy, bilateral: Secondary | ICD-10-CM | POA: Diagnosis not present

## 2021-03-10 DIAGNOSIS — E103293 Type 1 diabetes mellitus with mild nonproliferative diabetic retinopathy without macular edema, bilateral: Secondary | ICD-10-CM | POA: Diagnosis not present

## 2021-03-10 DIAGNOSIS — H4720 Unspecified optic atrophy: Secondary | ICD-10-CM | POA: Diagnosis not present

## 2021-03-10 DIAGNOSIS — H5340 Unspecified visual field defects: Secondary | ICD-10-CM | POA: Diagnosis not present

## 2021-03-25 DIAGNOSIS — G894 Chronic pain syndrome: Secondary | ICD-10-CM | POA: Diagnosis not present

## 2021-03-25 DIAGNOSIS — Z79899 Other long term (current) drug therapy: Secondary | ICD-10-CM | POA: Diagnosis not present

## 2021-03-25 DIAGNOSIS — G546 Phantom limb syndrome with pain: Secondary | ICD-10-CM | POA: Diagnosis not present

## 2021-04-02 ENCOUNTER — Other Ambulatory Visit: Payer: Self-pay

## 2021-04-02 ENCOUNTER — Encounter: Payer: Self-pay | Admitting: Family

## 2021-04-02 ENCOUNTER — Ambulatory Visit (INDEPENDENT_AMBULATORY_CARE_PROVIDER_SITE_OTHER): Payer: Medicare Other | Admitting: Family

## 2021-04-02 ENCOUNTER — Other Ambulatory Visit: Payer: Self-pay | Admitting: Family

## 2021-04-02 VITALS — BP 160/70 | HR 73 | Temp 98.7°F | Ht 71.0 in | Wt 223.2 lb

## 2021-04-02 DIAGNOSIS — Z89512 Acquired absence of left leg below knee: Secondary | ICD-10-CM

## 2021-04-02 DIAGNOSIS — Z89511 Acquired absence of right leg below knee: Secondary | ICD-10-CM

## 2021-04-02 DIAGNOSIS — K5900 Constipation, unspecified: Secondary | ICD-10-CM

## 2021-04-02 NOTE — Progress Notes (Signed)
[name] is a 45 y.o. male with the following history as recorded in EpicCare:  Patient Active Problem List   Diagnosis Date Noted  . Olecranon bursitis, right elbow   . GI bleed 01/17/2020  . Acute kidney injury due to COVID-19 ([place]) 01/16/2020  . Dehydration 01/16/2020  . Gastroenteritis due to COVID-19 virus 01/16/2020  . CKD (chronic kidney disease), stage III ([place]) 10/26/2017  . Abnormal EKG 10/26/2017  . Hypertension 08/22/2017  . Acute GI bleeding 07/17/2017  . Hypertensive urgency 12/29/2016  . Hematemesis   . Healthcare maintenance 08/26/2016  . Back pain 12/15/2015  . History of diabetes mellitus 02/20/2015  . Abdominal pain 09/10/2014  . Weakness of left upper extremity 07/03/2014  . Impaired mobility and activities of daily living 07/03/2014  . Sternoclavicular joint pain 03/04/2014  . Renal transplant, status post 07/19/2013  . History of pancreas transplant ([place]) 07/19/2013  . Immunosuppressed status ([place]) 06/27/2013  . H/O pancreas transplant ([place]) 06/27/2013  . Diabetic gastroparesis associated with type 1 diabetes mellitus ([place]) 05/08/2013  . BKA stump complication ([place]) 09/32/6712  . S/P bilateral BKA (below knee amputation) ([place]) 08/17/2012  . Metabolic bone disease 45/80/9983  . ESRD (end stage renal disease) ([place]) 11/17/2009  . GERD 03/14/2007  . HLD (hyperlipidemia) 02/22/2007  . Major depressive disorder, recurrent episode ([place]) 02/22/2007  . POST TRAUMATIC STRESS DISORDER 02/22/2007  . 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 aspirin EC 81 MG tablet Take 81 mg by mouth daily.    . hydrALAZINE (APRESOLINE) 50 MG tablet Take 1 tablet (50 mg total) by mouth 3 (three) times daily. (Patient taking differently: Take 75 mg by mouth 2 (two) times daily.) 30 tablet 0  . labetalol (NORMODYNE) 200 MG tablet TK 1 T PO BID  6  . metoCLOPramide (REGLAN) 10 MG tablet Take 1 tablet (10 mg total) by  mouth 4 (four) times daily. Take 1tab 20-30 minutes before breakfast, lunch, dinner and at bedtime 120 tablet 1  . mycophenolate (MYFORTIC) 180 MG EC tablet Take 360 mg by mouth 2 (two) times daily.    [name] omeprazole (PRILOSEC) 40 MG capsule Take 1 capsule (40 mg total) by mouth 2 (two) times daily before a meal. 60 capsule 0  . ondansetron (ZOFRAN ODT) 4 MG disintegrating tablet Take 1 tablet (4 mg total) by mouth every 8 (eight) hours as needed for nausea or vomiting. 20 tablet 0  . OxyCODONE HCl 15 MG TABA Take 15 mg by mouth 4 (four) times daily.    . predniSONE (DELTASONE) 5 MG tablet Take 5 mg by mouth daily.    . promethazine (PHENERGAN) 25 MG tablet Take 25 mg by mouth every 6 (six) hours as needed for nausea or vomiting.    . saccharomyces boulardii (FLORASTOR) 250 MG capsule Take 250 mg by mouth 2 (two) times daily.    [name] sulfamethoxazole-trimethoprim (BACTRIM,SEPTRA) 400-80 MG per tablet Take 1 tablet by mouth every Monday, Wednesday, and Friday.     [name] BELATACEPT IV Inject into the vein. (Patient not taking: Reported on 04/02/2021)    . sucralfate (CARAFATE) 1 GM/10ML suspension Take 10 mLs (1 g total) by mouth 4 (four) times daily -  with meals and at bedtime for 21 days. 420 mL 1  . tacrolimus (PROGRAF) 1 MG capsule Take 2-3 mg by mouth 2 (two) times daily. 3 mg in the morning, 2 mg in the evening (Patient not taking: Reported on  04/02/2021)     No current facility-administered medications for this visit.    Allergies: Lisinopril and Amlodipine  Past Medical History:  Diagnosis Date  . 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 Arthritis    "I think I do; just in my fingers & my hands"  . Blood transfusion   . Cataract   . Chronic pain   . Depression    Patient states he has never been depressed.  . Diabetes mellitus without complication ([place])    no since pancreas transplant  . Dialysis patient [facility]) 04/18/12   "[facility]; T[place], [place], [place]"  . Edema   . ESRD (end  stage renal disease) on dialysis ([place])   . Gastroparesis   . Gastropathy   . GERD (gastroesophageal reflux disease)   . Headache(784.0)   . Hypertension   . MRSA infection    over 10 years ago per patient. in legs    Past Surgical History:  Procedure Laterality Date  . AV FISTULA PLACEMENT  08/2011   left upper arm  . BELOW KNEE LEG AMPUTATION  "it's been awhile"   bilaterally  . CATARACT EXTRACTION  ~ 2011   right  . COMBINED KIDNEY-PANCREAS TRANSPLANT  2014  . ESOPHAGOGASTRODUODENOSCOPY N/A 12/30/2016   Procedure: ESOPHAGOGASTRODUODENOSCOPY (EGD);  Surgeon: Carol Ada, MD;  Location[facility] ENDOSCOPY;  Service: Endoscopy;  Laterality: N/A;  . OLECRANON BURSECTOMY Right 06/23/2020   Procedure: RIGHT ELBOW EXCISION OLECRANON BURSITIS;  Surgeon: Newt Minion, MD;  Location: [facility];  Service: Orthopedics;  Laterality: Right;    Family History  Problem Relation Age of Onset  . Diabetes Other   . Hypertension Other   . Hypertension Mother   . Diabetes Mother   . Kidney disease Mother   . Diabetes Maternal Grandmother   . Diabetes Paternal Grandmother   . Colon cancer Neg Hx   . Esophageal cancer Neg Hx   . Rectal cancer Neg Hx   . Stomach cancer Neg Hx     Social History   Tobacco Use  . Smoking status: Current Some Day Smoker    Types: Cigars  . Smokeless tobacco: Never Used  . Tobacco comment: black and mild  Substance Use Topics  . Alcohol use: No    Subjective:   Requesting updated orders for his prosthetic sleeve supplies;  Patient also concerns for constipation x 1 week; does take Oxycodone 4 x per day per pain clinic; has tried OTC Miralax, [place] with not relief; "notes that his belly feels tight." No blood in stool, "feel full and bloated." Has been told my his nephrologist that his medications could cause constipation and prefers to discuss with her first; he does not want our office to do any testing today;  Majority of his care is managed  by his specialists- patient in agreement that we do not need to update any labs today;     Objective:  Vitals:   04/02/21 0943  BP: (!) 160/70  Pulse: 73  Temp: 98.7 F (37.1 C)  TempSrc: Oral  SpO2: 98%  Weight: 223 lb 3.2 oz (101.2 kg)  Height: 5\' 11"  (1.803 m)    General: Well developed, well nourished, in no acute distress  Skin : Warm and dry.  Head: Normocephalic and atraumatic  Eyes: Sclera and conjunctiva clear; pupils round and reactive to light; extraocular movements intact  Ears: External normal; canals clear; tympanic membranes normal  Oropharynx: Pink, supple. No suspicious lesions  Neck:  Supple without thyromegaly, adenopathy  Lungs: Respirations unlabored; clear to auscultation bilaterally without wheeze, rales, rhonchi  Neurologic: Alert and oriented; speech intact; face symmetrical; moves all extremities well; CNII-XII intact without focal deficit   Assessment:  1. S/P bilateral BKA (below knee amputation) ([place])     Plan:  Orders for supplies written as requested;  He will discuss his constipation concerns with his nephrologist; he defers updating imaging today to evaluate for bowel obstruction; Continue with specialists as scheduled for management of his healthcare needs and here as needed for acute care concerns;  This visit occurred during the SARS-CoV-2 public health emergency.  Safety protocols were in place, including screening questions prior to the visit, additional usage of staff PPE, and extensive cleaning of exam room while observing appropriate contact time as indicated for disinfecting solutions.     No follow-ups on file.  No orders of the defined types were placed in this encounter.   Requested Prescriptions    No prescriptions requested or ordered in this encounter

## 2021-04-14 DIAGNOSIS — Z940 Kidney transplant status: Secondary | ICD-10-CM | POA: Diagnosis not present

## 2021-04-23 DIAGNOSIS — R030 Elevated blood-pressure reading, without diagnosis of hypertension: Secondary | ICD-10-CM | POA: Diagnosis not present

## 2021-04-23 DIAGNOSIS — Z79899 Other long term (current) drug therapy: Secondary | ICD-10-CM | POA: Diagnosis not present

## 2021-04-23 DIAGNOSIS — G546 Phantom limb syndrome with pain: Secondary | ICD-10-CM | POA: Diagnosis not present

## 2021-04-23 DIAGNOSIS — G894 Chronic pain syndrome: Secondary | ICD-10-CM | POA: Diagnosis not present

## 2021-05-12 DIAGNOSIS — Z940 Kidney transplant status: Secondary | ICD-10-CM | POA: Diagnosis not present

## 2021-05-12 DIAGNOSIS — R799 Abnormal finding of blood chemistry, unspecified: Secondary | ICD-10-CM | POA: Diagnosis not present

## 2021-05-20 DIAGNOSIS — N183 Chronic kidney disease, stage 3 unspecified: Secondary | ICD-10-CM | POA: Diagnosis not present

## 2021-05-20 DIAGNOSIS — Z9483 Pancreas transplant status: Secondary | ICD-10-CM | POA: Diagnosis not present

## 2021-05-20 DIAGNOSIS — Z79899 Other long term (current) drug therapy: Secondary | ICD-10-CM | POA: Diagnosis not present

## 2021-05-20 DIAGNOSIS — Z940 Kidney transplant status: Secondary | ICD-10-CM | POA: Diagnosis not present

## 2021-05-20 DIAGNOSIS — E7800 Pure hypercholesterolemia, unspecified: Secondary | ICD-10-CM | POA: Diagnosis not present

## 2021-05-20 DIAGNOSIS — D751 Secondary polycythemia: Secondary | ICD-10-CM | POA: Diagnosis not present

## 2021-05-20 DIAGNOSIS — I129 Hypertensive chronic kidney disease with stage 1 through stage 4 chronic kidney disease, or unspecified chronic kidney disease: Secondary | ICD-10-CM | POA: Diagnosis not present

## 2021-05-24 DIAGNOSIS — G546 Phantom limb syndrome with pain: Secondary | ICD-10-CM | POA: Diagnosis not present

## 2021-05-24 DIAGNOSIS — G894 Chronic pain syndrome: Secondary | ICD-10-CM | POA: Diagnosis not present

## 2021-05-24 DIAGNOSIS — R030 Elevated blood-pressure reading, without diagnosis of hypertension: Secondary | ICD-10-CM | POA: Diagnosis not present

## 2021-05-31 ENCOUNTER — Ambulatory Visit: Payer: Medicare Other | Admitting: Orthopedic Surgery

## 2021-06-01 ENCOUNTER — Encounter: Payer: Self-pay | Admitting: Family

## 2021-06-01 ENCOUNTER — Ambulatory Visit (INDEPENDENT_AMBULATORY_CARE_PROVIDER_SITE_OTHER): Payer: Medicare Other | Admitting: Family

## 2021-06-01 DIAGNOSIS — Z89512 Acquired absence of left leg below knee: Secondary | ICD-10-CM

## 2021-06-01 DIAGNOSIS — Z89511 Acquired absence of right leg below knee: Secondary | ICD-10-CM | POA: Diagnosis not present

## 2021-06-01 NOTE — Progress Notes (Addendum)
Office Visit Note   Patient: [name]           Date of Birth: May 17, 1976           MRN: [id number] Visit Date: 06/01/2021              Requested by: Marrian Salvage, [address] [place],  [place] [address] PCP: Marrian Salvage, FNP  No chief complaint on file.     HPI: The patient is a 45 year old gentleman who presents to the with a concern of poor fitting prosthesis sockets and liners.  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  He has gained weight he used to be able to wear several layers of ply now cannot wear any ply and has discomfort with prosthesis use he has developed some calluses no open ulcerations is working with [employer] they have advised he will require new sockets and liners  Patient is an existing bilateral transtibial  amputee.  Patient's current comorbidities are not expected to impact the ability to function with the prescribed prosthesis. Patient verbally communicates a strong desire to use a prosthesis. Patient currently requires mobility aids to ambulate without a prosthesis.  Expects not to use mobility aids with a new prosthesis.  Patient is a K3 level ambulator that spends a lot of time walking around on uneven terrain over obstacles, up and down stairs, and ambulates with a variable cadence.    Assessment & Plan: Visit Diagnoses:  1. S/P bilateral below knee amputation ([place])     Plan: Provided an order for new prosthesis set up and supplies he will follow-up in the office as needed  Follow-Up Instructions: No follow-ups on file.   Ortho Exam  Patient is alert, oriented, no adenopathy, well-dressed, normal affect, normal respiratory effort. On examination bilateral residual limbs are well consolidated well-healed he does have callused areas from an bearing as well as over the fibular head and the tibial tubercle.  There is no  edema  Imaging: No results found. No images are attached to the encounter.  Labs: Lab Results  Component Value Date   HGBA1C 5.4 10/26/2017   HGBA1C 5.4 12/31/2016   HGBA1C 5.5 04/22/2016   CRP 0.7 01/20/2020   CRP 0.7 01/19/2020   CRP 1.1 (H) 01/18/2020   LABURIC 5.6 10/23/2020   REPTSTATUS 05/17/2017 FINAL 05/14/2017   GRAMSTAIN  05/14/2017    FEW WBC PRESENT, PREDOMINANTLY PMN RARE GRAM POSITIVE COCCI IN PAIRS    CULT NORMAL SKIN FLORA 05/14/2017     Lab Results  Component Value Date   ALBUMIN 3.4 (L) 06/19/2020   ALBUMIN 4.1 02/26/2020   ALBUMIN 3.9 01/16/2020    Lab Results  Component Value Date   MG 2.1 01/20/2020   MG 1.7 01/19/2020   MG 1.7 01/18/2020   No results found for: VD25OH  No results found for: PREALBUMIN CBC EXTENDED Latest Ref Rng & Units 06/19/2020 02/26/2020 01/20/2020  WBC 4.0 - 10.5 K/uL 7.4 7.0 4.1  RBC 4.22 - 5.81 MIL/uL 5.02 5.94(H) 5.25  HGB 13.0 - 17.0 g/dL 15.3 18.3(H) 16.5  HCT 39.0 - 52.0 % 48.5 57.2(H) 49.3  PLT 150 - 400 K/uL 269 271 188  NEUTROABS 1.7 - 7.7 K/uL - 4.5 2.8  LYMPHSABS 0.7 - 4.0 K/uL - 1.4 0.6(L)     There is no height or weight on file to  calculate BMI.  Orders:  No orders of the defined types were placed in this encounter.  No orders of the defined types were placed in this encounter.    Procedures: No procedures performed  Clinical Data: No additional findings.  ROS:  All other systems negative, except as noted in the HPI. Review of Systems  Constitutional: Negative for chills and fever.  Cardiovascular: Negative for leg swelling.  Skin: Negative for color change and wound.    Objective: Vital Signs: There were no vitals taken for this visit.  Specialty Comments:  No specialty comments available.  PMFS History: Patient Active Problem List   Diagnosis Date Noted   Olecranon bursitis, right elbow    GI bleed 01/17/2020   Acute kidney injury due to COVID-19 [facility]) 01/16/2020    Dehydration 01/16/2020   Gastroenteritis due to COVID-19 virus 01/16/2020   CKD (chronic kidney disease), stage III ([place]) 10/26/2017   Abnormal EKG 10/26/2017   Hypertension 08/22/2017   Acute GI bleeding 07/17/2017   Hypertensive urgency 12/29/2016   Hematemesis    Healthcare maintenance 08/26/2016   Back pain 12/15/2015   History of diabetes mellitus 02/20/2015   Abdominal pain 09/10/2014   Weakness of left upper extremity 07/03/2014   Impaired mobility and activities of daily living 07/03/2014   Sternoclavicular joint pain 03/04/2014   Renal transplant, status post 07/19/2013   History of pancreas transplant ([place]) 07/19/2013   Immunosuppressed status ([place]) 06/27/2013   H/O pancreas transplant ([place]) 06/27/2013   Diabetic gastroparesis associated with type 1 diabetes mellitus ([place]) 05/08/2013   BKA stump complication ([place]) 62/95/2841   S/P bilateral BKA (below knee amputation) ([place]) 32/44/0102   Metabolic bone disease 72/53/6644   ESRD (end stage renal disease) ([place]) 11/17/2009   GERD 03/14/2007   HLD (hyperlipidemia) 02/22/2007   Major depressive disorder, recurrent episode ([place]) 02/22/2007   POST TRAUMATIC STRESS DISORDER 02/22/2007   IMPOTENCE, 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   Diabetes mellitus without complication [facility])    no since pancreas transplant   Dialysis patient [facility]) 04/18/12   "[facility]; [place], [place], Sat"   Edema    ESRD (end stage renal disease) on dialysis ([place])    Gastroparesis    Gastropathy    GERD (gastroesophageal reflux disease)    Headache(784.0)    Hypertension    MRSA infection    over 10 years ago per patient. in legs    Family History  Problem Relation Age of Onset   Diabetes  Other    Hypertension Other    Hypertension Mother    Diabetes Mother    Kidney disease Mother    Diabetes Maternal Grandmother    Diabetes Paternal Grandmother    Colon cancer Neg Hx    Esophageal cancer Neg Hx    Rectal cancer Neg Hx    Stomach cancer Neg Hx     Past Surgical History:  Procedure Laterality Date   AV FISTULA PLACEMENT  08/2011   left upper arm   BELOW KNEE LEG AMPUTATION  "it's been awhile"   bilaterally   CATARACT EXTRACTION  ~ 2011   right   COMBINED KIDNEY-PANCREAS TRANSPLANT  2014   ESOPHAGOGASTRODUODENOSCOPY N/A 12/30/2016   Procedure: ESOPHAGOGASTRODUODENOSCOPY (EGD);  Surgeon: Carol Ada, MD;  Location: [facility] ENDOSCOPY;  Service: Endoscopy;  Laterality: N/A;   OLECRANON BURSECTOMY Right 06/23/2020   Procedure: RIGHT ELBOW EXCISION OLECRANON BURSITIS;  Surgeon: Newt Minion, MD;  Location: [place];  Service: Orthopedics;  Laterality: Right;   Social History   Occupational History   Occupation: Disability  Tobacco Use   Smoking status: Current Some Day Smoker    Types: Cigars   Smokeless tobacco: Never Used   Tobacco comment: black and mild  Substance and Sexual Activity   Alcohol use: No   Drug use: Not Currently    Frequency: 3.0 times per week    Types: Marijuana    Comment: Occasionally   Sexual activity: Not Currently

## 2021-06-09 DIAGNOSIS — Z940 Kidney transplant status: Secondary | ICD-10-CM | POA: Diagnosis not present

## 2021-06-16 DIAGNOSIS — K3184 Gastroparesis: Secondary | ICD-10-CM | POA: Diagnosis not present

## 2021-06-16 DIAGNOSIS — Z9483 Pancreas transplant status: Secondary | ICD-10-CM | POA: Diagnosis not present

## 2021-06-16 DIAGNOSIS — I120 Hypertensive chronic kidney disease with stage 5 chronic kidney disease or end stage renal disease: Secondary | ICD-10-CM | POA: Diagnosis not present

## 2021-06-16 DIAGNOSIS — Z89512 Acquired absence of left leg below knee: Secondary | ICD-10-CM | POA: Diagnosis not present

## 2021-06-16 DIAGNOSIS — E782 Mixed hyperlipidemia: Secondary | ICD-10-CM | POA: Diagnosis not present

## 2021-06-16 DIAGNOSIS — I151 Hypertension secondary to other renal disorders: Secondary | ICD-10-CM | POA: Diagnosis not present

## 2021-06-16 DIAGNOSIS — Z940 Kidney transplant status: Secondary | ICD-10-CM | POA: Diagnosis not present

## 2021-06-16 DIAGNOSIS — E103293 Type 1 diabetes mellitus with mild nonproliferative diabetic retinopathy without macular edema, bilateral: Secondary | ICD-10-CM | POA: Diagnosis not present

## 2021-06-16 DIAGNOSIS — Z5181 Encounter for therapeutic drug level monitoring: Secondary | ICD-10-CM | POA: Diagnosis not present

## 2021-06-16 DIAGNOSIS — Z4822 Encounter for aftercare following kidney transplant: Secondary | ICD-10-CM | POA: Diagnosis not present

## 2021-06-16 DIAGNOSIS — E872 Acidosis: Secondary | ICD-10-CM | POA: Diagnosis not present

## 2021-06-16 DIAGNOSIS — N186 End stage renal disease: Secondary | ICD-10-CM | POA: Diagnosis not present

## 2021-06-16 DIAGNOSIS — I272 Pulmonary hypertension, unspecified: Secondary | ICD-10-CM | POA: Diagnosis not present

## 2021-06-16 DIAGNOSIS — D849 Immunodeficiency, unspecified: Secondary | ICD-10-CM | POA: Diagnosis not present

## 2021-06-16 DIAGNOSIS — E1022 Type 1 diabetes mellitus with diabetic chronic kidney disease: Secondary | ICD-10-CM | POA: Diagnosis not present

## 2021-06-16 DIAGNOSIS — Z79899 Other long term (current) drug therapy: Secondary | ICD-10-CM | POA: Diagnosis not present

## 2021-06-16 LAB — CBC AND DIFFERENTIAL
HCT: 47 (ref 41–53)
Hemoglobin: 14.9 (ref 13.5–17.5)
Platelets: 234 (ref 150–399)
WBC: 7.6

## 2021-06-16 LAB — HEMOGLOBIN A1C: Hemoglobin A1C: 5.7

## 2021-06-16 LAB — LIPID PANEL
Cholesterol: 195 (ref 0–200)
HDL: 40 (ref 35–70)
LDL Cholesterol: 122
Triglycerides: 147 (ref 40–160)

## 2021-06-24 DIAGNOSIS — G546 Phantom limb syndrome with pain: Secondary | ICD-10-CM | POA: Diagnosis not present

## 2021-06-24 DIAGNOSIS — Z79899 Other long term (current) drug therapy: Secondary | ICD-10-CM | POA: Diagnosis not present

## 2021-07-09 ENCOUNTER — Ambulatory Visit: Payer: Medicare Other

## 2021-07-14 DIAGNOSIS — Z940 Kidney transplant status: Secondary | ICD-10-CM | POA: Diagnosis not present

## 2021-07-23 DIAGNOSIS — G546 Phantom limb syndrome with pain: Secondary | ICD-10-CM | POA: Diagnosis not present

## 2021-07-23 DIAGNOSIS — Z79899 Other long term (current) drug therapy: Secondary | ICD-10-CM | POA: Diagnosis not present

## 2021-07-23 DIAGNOSIS — M79604 Pain in right leg: Secondary | ICD-10-CM | POA: Diagnosis not present

## 2021-07-23 DIAGNOSIS — E559 Vitamin D deficiency, unspecified: Secondary | ICD-10-CM | POA: Diagnosis not present

## 2021-08-11 DIAGNOSIS — Z940 Kidney transplant status: Secondary | ICD-10-CM | POA: Diagnosis not present

## 2021-08-23 ENCOUNTER — Telehealth: Payer: Self-pay | Admitting: Orthopedic Surgery

## 2021-08-23 DIAGNOSIS — Z79899 Other long term (current) drug therapy: Secondary | ICD-10-CM | POA: Diagnosis not present

## 2021-08-23 DIAGNOSIS — R030 Elevated blood-pressure reading, without diagnosis of hypertension: Secondary | ICD-10-CM | POA: Diagnosis not present

## 2021-08-23 DIAGNOSIS — G546 Phantom limb syndrome with pain: Secondary | ICD-10-CM | POA: Diagnosis not present

## 2021-08-23 DIAGNOSIS — Z9181 History of falling: Secondary | ICD-10-CM | POA: Diagnosis not present

## 2021-08-23 NOTE — Telephone Encounter (Signed)
Pt called requesting a call back. Pt states he need to explain what is going on with his stump. He need a return call from Dr. Sharol Given, [place], or [place]. Pt phone number is [phone].

## 2021-08-24 ENCOUNTER — Ambulatory Visit: Payer: Medicare Other | Admitting: Family

## 2021-08-25 ENCOUNTER — Ambulatory Visit (INDEPENDENT_AMBULATORY_CARE_PROVIDER_SITE_OTHER): Payer: Medicare Other | Admitting: Family

## 2021-08-25 DIAGNOSIS — Z89512 Acquired absence of left leg below knee: Secondary | ICD-10-CM

## 2021-08-25 DIAGNOSIS — Z89511 Acquired absence of right leg below knee: Secondary | ICD-10-CM | POA: Diagnosis not present

## 2021-08-25 DIAGNOSIS — L97211 Non-pressure chronic ulcer of right calf limited to breakdown of skin: Secondary | ICD-10-CM

## 2021-08-25 NOTE — Progress Notes (Signed)
Office Visit Note   Patient: [name]           Date of Birth: 07-23-1976           MRN: [id number] Visit Date: 08/25/2021              Requested by: Marrian Salvage, [address] [place],   [address] PCP: Marrian Salvage, FNP  Chief Complaint  Patient presents with   Right Knee - Pain      HPI: [place] is a 45 year old gentleman who presents today for continued pain and callus to the distal end of his right below-knee amputation  He was last seen in June for similar pain.  He has been awaiting new socket set up with Ronalee Belts at [employer].  He states this will be ready for him in about 2 weeks.  He continues to have quite intense pain this forces him to often stop weightbearing he states he has been spending some time just in bed so that he does not have to use his prosthetic.  He has not had any skin breakdown or open ulceration.  He trims his callus with a ped egg. this does not seem to alleviate any of his symptoms  Assessment & Plan: Visit Diagnoses:  1. S/P bilateral BKA (below knee amputation) ([place])     Plan: Callus was debrided today with a 10 blade knife.  Patient tolerated this well.  Discussed callus and pain from end bearing.  Will follow him after he begins wear of his new prosthetic  Follow-Up Instructions: Return in about 6 weeks (around 10/06/2021), or p getting new socket.   Ortho Exam  Patient is alert, oriented, no adenopathy, well-dressed, normal affect, normal respiratory effort. On examination of the right residual limb the incision surgically is well-healed the limb is well consolidated unfortunately there is callus buildup over the tibial tubercle and distally this was debrided with a 10 blade knife patient tolerated well there is no ulceration. Silver nitrate used for hemostasis.  Imaging: No results found. No images are attached to the encounter.  Labs: Lab Results  Component Value Date   HGBA1C 5.4  10/26/2017   HGBA1C 5.4 12/31/2016   HGBA1C 5.5 04/22/2016   CRP 0.7 01/20/2020   CRP 0.7 01/19/2020   CRP 1.1 (H) 01/18/2020   LABURIC 5.6 10/23/2020   REPTSTATUS 05/17/2017 FINAL 05/14/2017   GRAMSTAIN  05/14/2017    FEW WBC PRESENT, PREDOMINANTLY PMN RARE GRAM POSITIVE COCCI IN PAIRS    CULT NORMAL SKIN FLORA 05/14/2017     Lab Results  Component Value Date   ALBUMIN 3.4 (L) 06/19/2020   ALBUMIN 4.1 02/26/2020   ALBUMIN 3.9 01/16/2020    Lab Results  Component Value Date   MG 2.1 01/20/2020   MG 1.7 01/19/2020   MG 1.7 01/18/2020   No results found for: VD25OH  No results found for: PREALBUMIN CBC EXTENDED Latest Ref Rng & Units 06/19/2020 02/26/2020 01/20/2020  WBC 4.0 - 10.5 K/uL 7.4 7.0 4.1  RBC 4.22 - 5.81 MIL/uL 5.02 5.94(H) 5.25  HGB 13.0 - 17.0 g/dL 15.3 18.3(H) 16.5  HCT 39.0 - 52.0 % 48.5 57.2(H) 49.3  PLT 150 - 400 K/uL 269 271 188  NEUTROABS 1.7 - 7.7 K/uL - 4.5 2.8  LYMPHSABS 0.7 - 4.0 K/uL - 1.4 0.6(L)     There is no height or weight on file to calculate BMI.  Orders:  No orders of the defined  types were placed in this encounter.  No orders of the defined types were placed in this encounter.    Procedures: No procedures performed  Clinical Data: No additional findings.  ROS:  All other systems negative, except as noted in the HPI. Review of Systems  Constitutional:  Negative for chills and fever.  Skin:  Negative for color change and rash.       Callus   Objective: Vital Signs: There were no vitals taken for this visit.  Specialty Comments:  No specialty comments available.  PMFS History: Patient Active Problem List   Diagnosis Date Noted   Olecranon bursitis, right elbow    GI bleed 01/17/2020   Acute kidney injury due to COVID-19 [facility]) 01/16/2020   Dehydration 01/16/2020   Gastroenteritis due to COVID-19 virus 01/16/2020   CKD (chronic kidney disease), stage III ([place]) 10/26/2017   Abnormal EKG 10/26/2017   Hypertension  08/22/2017   Acute GI bleeding 07/17/2017   Hypertensive urgency 12/29/2016   Hematemesis    Healthcare maintenance 08/26/2016   Back pain 12/15/2015   History of diabetes mellitus 02/20/2015   Abdominal pain 09/10/2014   Weakness of left upper extremity 07/03/2014   Impaired mobility and activities of daily living 07/03/2014   Sternoclavicular joint pain 03/04/2014   Renal transplant, status post 07/19/2013   History of pancreas transplant ([place]) 07/19/2013   Immunosuppressed status ([place]) 06/27/2013   H/O pancreas transplant ([place]) 06/27/2013   Diabetic gastroparesis associated with type 1 diabetes mellitus ([place]) 05/08/2013   BKA stump complication ([place]) 94/85/4627   S/P bilateral BKA (below knee amputation) ([place]) 03/50/0938   Metabolic bone disease 18/29/9371   ESRD (end stage renal disease) ([place]) 11/17/2009   GERD 03/14/2007   HLD (hyperlipidemia) 02/22/2007   Major depressive disorder, recurrent episode ([place]) 02/22/2007   POST TRAUMATIC STRESS DISORDER 02/22/2007   IMPOTENCE, 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   Diabetes mellitus without complication [facility]    no since pancreas transplant   Dialysis patient [facility]) 04/18/12   "[facility]; [place], [place], Sat"   Edema    ESRD (end stage renal disease) on dialysis ([place])    Gastroparesis    Gastropathy    GERD (gastroesophageal reflux disease)    Headache(784.0)    Hypertension    MRSA infection    over 10 years ago per patient. in legs    Family History  Problem Relation Age of Onset   Diabetes Other    Hypertension Other    Hypertension Mother    Diabetes Mother    Kidney disease Mother    Diabetes Maternal Grandmother    Diabetes Paternal Grandmother    Colon  cancer Neg Hx    Esophageal cancer Neg Hx    Rectal cancer Neg Hx    Stomach cancer Neg Hx     Past Surgical History:  Procedure Laterality Date   AV FISTULA PLACEMENT  08/2011   left upper arm   BELOW KNEE LEG AMPUTATION  "it's been awhile"   bilaterally   CATARACT EXTRACTION  ~ 2011   right   COMBINED KIDNEY-PANCREAS TRANSPLANT  2014   ESOPHAGOGASTRODUODENOSCOPY N/A 12/30/2016  Procedure: ESOPHAGOGASTRODUODENOSCOPY (EGD);  Surgeon: Carol Ada, MD;  Location[facility] ENDOSCOPY;  Service: Endoscopy;  Laterality: N/A;   OLECRANON BURSECTOMY Right 06/23/2020   Procedure: RIGHT ELBOW EXCISION OLECRANON BURSITIS;  Surgeon: Newt Minion, MD;  Location: [place];  Service: Orthopedics;  Laterality: Right;   Social History   Occupational History   Occupation: Disability  Tobacco Use   Smoking status: Some Days    Types: Cigars   Smokeless tobacco: Never   Tobacco comments:    black and mild  Substance and Sexual Activity   Alcohol use: No   Drug use: Not Currently    Frequency: 3.0 times per week    Types: Marijuana    Comment: Occasionally   Sexual activity: Not Currently

## 2021-09-08 DIAGNOSIS — Z940 Kidney transplant status: Secondary | ICD-10-CM | POA: Diagnosis not present

## 2021-09-15 DIAGNOSIS — H4720 Unspecified optic atrophy: Secondary | ICD-10-CM | POA: Diagnosis not present

## 2021-09-15 DIAGNOSIS — E103293 Type 1 diabetes mellitus with mild nonproliferative diabetic retinopathy without macular edema, bilateral: Secondary | ICD-10-CM | POA: Diagnosis not present

## 2021-09-15 DIAGNOSIS — H5340 Unspecified visual field defects: Secondary | ICD-10-CM | POA: Diagnosis not present

## 2021-09-15 DIAGNOSIS — Z961 Presence of intraocular lens: Secondary | ICD-10-CM | POA: Diagnosis not present

## 2021-09-15 LAB — HM DIABETES EYE EXAM

## 2021-09-16 DIAGNOSIS — Z23 Encounter for immunization: Secondary | ICD-10-CM | POA: Diagnosis not present

## 2021-09-16 DIAGNOSIS — D751 Secondary polycythemia: Secondary | ICD-10-CM | POA: Diagnosis not present

## 2021-09-16 DIAGNOSIS — N183 Chronic kidney disease, stage 3 unspecified: Secondary | ICD-10-CM | POA: Diagnosis not present

## 2021-09-16 DIAGNOSIS — Z9483 Pancreas transplant status: Secondary | ICD-10-CM | POA: Diagnosis not present

## 2021-09-16 DIAGNOSIS — E7800 Pure hypercholesterolemia, unspecified: Secondary | ICD-10-CM | POA: Diagnosis not present

## 2021-09-16 DIAGNOSIS — Z940 Kidney transplant status: Secondary | ICD-10-CM | POA: Diagnosis not present

## 2021-09-16 DIAGNOSIS — Z79899 Other long term (current) drug therapy: Secondary | ICD-10-CM | POA: Diagnosis not present

## 2021-09-16 DIAGNOSIS — I129 Hypertensive chronic kidney disease with stage 1 through stage 4 chronic kidney disease, or unspecified chronic kidney disease: Secondary | ICD-10-CM | POA: Diagnosis not present

## 2021-09-17 ENCOUNTER — Encounter: Payer: Self-pay | Admitting: Family

## 2021-09-23 DIAGNOSIS — G546 Phantom limb syndrome with pain: Secondary | ICD-10-CM | POA: Diagnosis not present

## 2021-09-23 DIAGNOSIS — Z9181 History of falling: Secondary | ICD-10-CM | POA: Diagnosis not present

## 2021-09-23 DIAGNOSIS — R030 Elevated blood-pressure reading, without diagnosis of hypertension: Secondary | ICD-10-CM | POA: Diagnosis not present

## 2021-09-23 DIAGNOSIS — Z79899 Other long term (current) drug therapy: Secondary | ICD-10-CM | POA: Diagnosis not present

## 2021-09-26 LAB — COMPREHENSIVE METABOLIC PANEL
Calcium: 9.2 (ref 8.7–10.7)
GFR calc Af Amer: 51

## 2021-09-26 LAB — CBC AND DIFFERENTIAL
HCT: 45 (ref 41–53)
Hemoglobin: 15.6 (ref 13.5–17.5)
Platelets: 263 (ref 150–399)
WBC: 7.5

## 2021-09-26 LAB — BASIC METABOLIC PANEL
BUN: 21 (ref 4–21)
CO2: 20 (ref 13–22)
Chloride: 110 — AB (ref 99–108)
Creatinine: 1.7 — AB (ref 0.6–1.3)
Potassium: 3.7 (ref 3.4–5.3)
Sodium: 139 (ref 137–147)

## 2021-09-26 LAB — HEPATIC FUNCTION PANEL
ALT: 13 (ref 10–40)
AST: 10 — AB (ref 14–40)

## 2021-10-06 ENCOUNTER — Encounter: Payer: Medicare Other | Admitting: Internal Medicine

## 2021-10-06 DIAGNOSIS — H4720 Unspecified optic atrophy: Secondary | ICD-10-CM | POA: Diagnosis not present

## 2021-10-13 ENCOUNTER — Telehealth: Payer: Self-pay | Admitting: Orthopedic Surgery

## 2021-10-13 NOTE — Telephone Encounter (Signed)
Message to [place] at [place] to ask what information is pending that is preventing this pt from getting his prosthetic. Will hold this message pending advisement

## 2021-10-13 NOTE — Telephone Encounter (Signed)
Patient called advised he has been waiting 2 months for his prosthetic leg. Patient said the insurance company sent information that needed to be completed by the provider before he can pick up his leg. Patient said he has been fitted and [employer] will not release his leg until they received the requested information. The number to contact patient is [phone]

## 2021-10-15 NOTE — Telephone Encounter (Signed)
Note in June hx [facility] needs to have the 5 tenants for prosthetic

## 2021-10-20 ENCOUNTER — Telehealth: Payer: Self-pay | Admitting: Orthopedic Surgery

## 2021-10-20 DIAGNOSIS — Z940 Kidney transplant status: Secondary | ICD-10-CM | POA: Diagnosis not present

## 2021-10-20 NOTE — Telephone Encounter (Signed)
Message sent to [employer] to ask when the expected release day will be for prosthetic. We faxed over an addendum on 10/15/21 to include the 5 tenants that are required for insurance. Will hold this message and await information

## 2021-10-20 NOTE — Telephone Encounter (Signed)
I called pt advised that I spoke with [employer] and they advised the updated note was sent  for review Friday and that they are still waiting for it to be reviewed. [employer] advised that they will keep me updated and I advised the pt that I will keep this message and call him on Friday with an update.

## 2021-10-20 NOTE — Telephone Encounter (Signed)
Pt called requesting a call back from Autumn F about [employer] and his prosthetic legs. He is asking for a call back as soon as possible at [phone].

## 2021-10-22 DIAGNOSIS — Z79899 Other long term (current) drug therapy: Secondary | ICD-10-CM | POA: Diagnosis not present

## 2021-10-22 DIAGNOSIS — G546 Phantom limb syndrome with pain: Secondary | ICD-10-CM | POA: Diagnosis not present

## 2021-10-22 DIAGNOSIS — R030 Elevated blood-pressure reading, without diagnosis of hypertension: Secondary | ICD-10-CM | POA: Diagnosis not present

## 2021-10-22 DIAGNOSIS — Z9181 History of falling: Secondary | ICD-10-CM | POA: Diagnosis not present

## 2021-10-25 NOTE — Telephone Encounter (Signed)
Called pt to advise that this is still in the authorization process. I advised that I would continue to hold this message and check through the week if any progress has been made. To call me with any questions or if he should hear something from [place] before I do.

## 2021-10-29 DIAGNOSIS — H4720 Unspecified optic atrophy: Secondary | ICD-10-CM | POA: Diagnosis not present

## 2021-11-16 DIAGNOSIS — Z89511 Acquired absence of right leg below knee: Secondary | ICD-10-CM | POA: Diagnosis not present

## 2021-11-16 DIAGNOSIS — Z89512 Acquired absence of left leg below knee: Secondary | ICD-10-CM | POA: Diagnosis not present

## 2021-11-17 DIAGNOSIS — Z940 Kidney transplant status: Secondary | ICD-10-CM | POA: Diagnosis not present

## 2021-11-23 DIAGNOSIS — G894 Chronic pain syndrome: Secondary | ICD-10-CM | POA: Diagnosis not present

## 2021-11-23 DIAGNOSIS — E559 Vitamin D deficiency, unspecified: Secondary | ICD-10-CM | POA: Diagnosis not present

## 2021-11-23 DIAGNOSIS — Z79899 Other long term (current) drug therapy: Secondary | ICD-10-CM | POA: Diagnosis not present

## 2021-11-23 DIAGNOSIS — G546 Phantom limb syndrome with pain: Secondary | ICD-10-CM | POA: Diagnosis not present

## 2021-11-23 DIAGNOSIS — Z0000 Encounter for general adult medical examination without abnormal findings: Secondary | ICD-10-CM | POA: Diagnosis not present

## 2022-01-10 ENCOUNTER — Encounter: Payer: Self-pay | Admitting: Internal Medicine

## 2022-01-10 ENCOUNTER — Other Ambulatory Visit: Payer: Self-pay

## 2022-01-10 ENCOUNTER — Ambulatory Visit (INDEPENDENT_AMBULATORY_CARE_PROVIDER_SITE_OTHER): Payer: Medicare Other | Admitting: Internal Medicine

## 2022-01-10 VITALS — BP 132/82 | HR 76 | Temp 98.2°F | Ht 71.0 in | Wt 221.0 lb

## 2022-01-10 DIAGNOSIS — Z0001 Encounter for general adult medical examination with abnormal findings: Secondary | ICD-10-CM

## 2022-01-10 DIAGNOSIS — Z89511 Acquired absence of right leg below knee: Secondary | ICD-10-CM | POA: Diagnosis not present

## 2022-01-10 DIAGNOSIS — Z89512 Acquired absence of left leg below knee: Secondary | ICD-10-CM

## 2022-01-10 DIAGNOSIS — N481 Balanitis: Secondary | ICD-10-CM | POA: Insufficient documentation

## 2022-01-10 DIAGNOSIS — Z1211 Encounter for screening for malignant neoplasm of colon: Secondary | ICD-10-CM | POA: Insufficient documentation

## 2022-01-10 DIAGNOSIS — Z125 Encounter for screening for malignant neoplasm of prostate: Secondary | ICD-10-CM | POA: Diagnosis not present

## 2022-01-10 DIAGNOSIS — R21 Rash and other nonspecific skin eruption: Secondary | ICD-10-CM | POA: Diagnosis not present

## 2022-01-10 DIAGNOSIS — Z23 Encounter for immunization: Secondary | ICD-10-CM | POA: Diagnosis not present

## 2022-01-10 DIAGNOSIS — E785 Hyperlipidemia, unspecified: Secondary | ICD-10-CM

## 2022-01-10 DIAGNOSIS — Z9483 Pancreas transplant status: Secondary | ICD-10-CM

## 2022-01-10 LAB — PSA: PSA: 1.3 ng/mL (ref 0.10–4.00)

## 2022-01-10 LAB — URINALYSIS, ROUTINE W REFLEX MICROSCOPIC
Bilirubin Urine: NEGATIVE
Hgb urine dipstick: NEGATIVE
Ketones, ur: NEGATIVE
Leukocytes,Ua: NEGATIVE
Nitrite: NEGATIVE
RBC / HPF: NONE SEEN (ref 0–?)
Specific Gravity, Urine: 1.01 (ref 1.000–1.030)
Total Protein, Urine: NEGATIVE
Urine Glucose: NEGATIVE
Urobilinogen, UA: 0.2 (ref 0.0–1.0)
pH: 6.5 (ref 5.0–8.0)

## 2022-01-10 MED ORDER — FLUCONAZOLE 150 MG PO TABS
150.0000 mg | ORAL_TABLET | Freq: Once | ORAL | 3 refills | Status: AC
Start: 2022-01-10 — End: 2022-01-10

## 2022-01-10 MED ORDER — METRONIDAZOLE 500 MG PO TABS
2000.0000 mg | ORAL_TABLET | Freq: Once | ORAL | 0 refills | Status: AC
Start: 2022-01-10 — End: 2022-01-10

## 2022-01-10 NOTE — Patient Instructions (Signed)

## 2022-01-10 NOTE — Progress Notes (Signed)
Subjective:  Patient ID: [name], male    DOB: May 05, 1976  Age: 46 y.o. MRN: [id number]  CC: Annual Exam and Rash  This visit occurred during the SARS-CoV-2 public health emergency.  Safety protocols were in place, including screening questions prior to the visit, additional usage of staff PPE, and extensive cleaning of exam room while observing appropriate contact time as indicated for disinfecting solutions.    HPI NAZARIO RUSSOM presents for a CPX and f/up-  He complains that his wife has been to the GYN multiple times and has been treated with antibiotics for a vaginal infection (he thinks it is BV).  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  Outpatient Medications Prior to Visit  Medication Sig Dispense Refill   aspirin EC 81 MG tablet Take 81 mg by mouth daily.     BELATACEPT IV Inject into the vein.     hydrALAZINE (APRESOLINE) 50 MG tablet Take 1 tablet (50 mg total) by mouth 3 (three) times daily. (Patient taking differently: Take 75 mg by mouth 2 (two) times daily.) 30 tablet 0   labetalol (NORMODYNE) 200 MG tablet TK 1 T PO BID  6   metoCLOPramide (REGLAN) 10 MG tablet Take 1 tablet (10 mg total) by mouth 4 (four) times daily. Take 1tab 20-30 minutes before breakfast, lunch, dinner and at bedtime 120 tablet 1   mycophenolate (MYFORTIC) 180 MG EC tablet Take 360 mg by mouth 2 (two) times daily.     omeprazole (PRILOSEC) 40 MG capsule Take 1 capsule (40 mg total) by mouth 2 (two) times daily before a meal. 60 capsule 0   ondansetron (ZOFRAN ODT) 4 MG disintegrating tablet Take 1 tablet (4 mg total) by mouth every 8 (eight) hours as needed for nausea or vomiting. 20 tablet 0   OxyCODONE HCl 15 MG TABA Take 15 mg by mouth 4 (four) times daily.     predniSONE (DELTASONE) 5 MG tablet Take 5 mg by mouth daily.     promethazine (PHENERGAN) 25 MG tablet Take 25 mg by mouth every 6 (six) hours as needed for nausea or vomiting.     saccharomyces  boulardii (FLORASTOR) 250 MG capsule Take 250 mg by mouth 2 (two) times daily.     sulfamethoxazole-trimethoprim (BACTRIM,SEPTRA) 400-80 MG per tablet Take 1 tablet by mouth every Monday, Wednesday, and Friday.      tacrolimus (PROGRAF) 1 MG capsule Take 2-3 mg by mouth 2 (two) times daily. 3 mg in the morning, 2 mg in the evening     sucralfate (CARAFATE) 1 GM/10ML suspension Take 10 mLs (1 g total) by mouth 4 (four) times daily -  with meals and at bedtime for 21 days. 420 mL 1   No facility-administered medications prior to visit.    ROS Review of Systems  Constitutional:  Negative for appetite change, chills, diaphoresis, fatigue and fever.  HENT: Negative.    Eyes: Negative.   Respiratory:  Negative for cough, chest tightness, shortness of breath and wheezing.   Cardiovascular:  Negative for chest pain, palpitations and leg swelling.  Gastrointestinal:  Negative for abdominal pain, blood in stool, constipation, diarrhea, nausea and vomiting.  Genitourinary:  Negative for decreased urine volume, difficulty urinating, dysuria, flank pain, genital sores, hematuria, penile discharge, penile pain, penile swelling, scrotal swelling, testicular pain and urgency.  Musculoskeletal: Negative.   Skin:  Positive for rash.       Rash on back for 2-3 months. It does  not bother him.  Neurological: Negative.  Negative for dizziness, weakness and light-headedness.  Hematological:  Negative for adenopathy. Does not bruise/bleed easily.  Psychiatric/Behavioral: Negative.     Objective:  BP 132/82 (BP Location: Right Arm, Patient Position: Sitting, Cuff Size: Large)    Pulse 76    Temp 98.2 F (36.8 C) (Oral)    Ht 5\' 11"  (1.803 m)    Wt 221 lb (100.2 kg)    SpO2 99%    BMI 30.82 kg/m   BP Readings from Last 3 Encounters:  01/10/22 132/82  04/02/21 (!) 160/70  06/23/20 (!) 169/83    Wt Readings from Last 3 Encounters:  01/10/22 221 lb (100.2 kg)  04/02/21 223 lb 3.2 oz (101.2 kg)  10/27/20 179  lb (81.2 kg)    Physical Exam Vitals reviewed.  HENT:     Nose: Nose normal.     Mouth/Throat:     Mouth: Mucous membranes are moist.  Eyes:     General: No scleral icterus.    Conjunctiva/sclera: Conjunctivae normal.  Cardiovascular:     Rate and Rhythm: Normal rate and regular rhythm.     Heart sounds: No murmur heard.   No friction rub.  Pulmonary:     Effort: Pulmonary effort is normal.     Breath sounds: No stridor. No wheezing, rhonchi or rales.  Abdominal:     General: Abdomen is flat.     Palpations: There is no mass.     Tenderness: There is no abdominal tenderness. There is no guarding.     Hernia: No hernia is present. There is no hernia in the left inguinal area or right inguinal area.  Genitourinary:    Pubic Area: Rash present.     Penis: Uncircumcised. Erythema and discharge present. No phimosis, paraphimosis, hypospadias, tenderness, swelling or lesions.      Testes: Normal.        Right: Mass, tenderness or swelling not present.        Left: Mass, tenderness or swelling not present.     Epididymis:     Right: Normal. Not inflamed or enlarged. No mass.     Left: Normal. Not inflamed or enlarged. No mass.     Comments: There is mild erythema and very scant exudate over the glans  He refused  the DRE Musculoskeletal:        General: Normal range of motion.     Cervical back: Neck supple.     Right lower leg: No edema.     Left lower leg: No edema.     Right Lower Extremity: Right leg is amputated below knee.     Left Lower Extremity: Left leg is amputated below knee.  Lymphadenopathy:     Cervical: No cervical adenopathy.     Lower Body: No right inguinal adenopathy. No left inguinal adenopathy.  Skin:    General: Skin is warm and dry.     Findings: Rash present. Rash is crusting, macular, nodular and scaling. Rash is not papular, purpuric, pustular or urticarial.     Comments: plaques with silver crust (see photo)  Neurological:     Mental Status: He  is alert.    Lab Results  Component Value Date   WBC 7.5 09/26/2021   HGB 15.6 09/26/2021   HCT 45 09/26/2021   PLT 263 09/26/2021   GLUCOSE 108 (H) 06/19/2020   CHOL 195 06/16/2021   TRIG 147 06/16/2021   HDL 40 06/16/2021   LDLDIRECT 116 (H)  01/21/2011   LDLCALC 122 06/16/2021   ALT 13 09/26/2021   AST 10 (A) 09/26/2021   NA 139 09/26/2021   K 3.7 09/26/2021   CL 110 (A) 09/26/2021   CREATININE 1.7 (A) 09/26/2021   BUN 21 09/26/2021   CO2 20 09/26/2021   TSH 0.867 03/06/2008   PSA 1.30 01/10/2022   INR 0.9 02/26/2020   HGBA1C 5.7 06/16/2021   MICROALBUR 0.7 08/26/2016    No results found.  Assessment & Plan:   Frans was seen today for annual exam and rash.  Diagnoses and all orders for this visit:  Screen for colon cancer -     Cologuard  S/P bilateral BKA (below knee amputation) ([place])- He is managing this well.  History of pancreas transplant ([place])  Balanitis- Will treat with fluconazole and metronidazole. -     Urinalysis, Routine w reflex microscopic; Future -     Chlamydia/Gonococcus/Trichomonas, NAA; Future -     Chlamydia/Gonococcus/Trichomonas, NAA -     Urinalysis, Routine w reflex microscopic -     fluconazole (DIFLUCAN) 150 MG tablet; Take 1 tablet (150 mg total) by mouth once for 1 dose. -     metroNIDAZOLE (FLAGYL) 500 MG tablet; Take 4 tablets (2,000 mg total) by mouth once for 1 dose.  Encounter for general adult medical examination with abnormal findings- Exam completed, labs reviewed, vaccines reviewed and updated, cancer screenings addressed, patient education was given.  Screening for prostate cancer -     PSA; Future -     PSA  Need for vaccination -     Pneumococcal conjugate vaccine 20-valent (Prevnar 20)  Rash of back- Will screen for syphilis. -     Ambulatory referral to Dermatology -     RPR; Future  Dyslipidemia, goal LDL below 100- His ASCVD risk score is 25.4%.  Will start a statin for cardiovascular risk reduction. -      TSH; Future   I am having Delance J. Seelman start on fluconazole and metroNIDAZOLE. I am also having him maintain his mycophenolate, tacrolimus, predniSONE, sulfamethoxazole-trimethoprim, aspirin EC, ondansetron, promethazine, saccharomyces boulardii, metoCLOPramide, oxyCODONE HCl, hydrALAZINE, labetalol, sucralfate, omeprazole, and BELATACEPT IV.  Meds ordered this encounter  Medications   fluconazole (DIFLUCAN) 150 MG tablet    Sig: Take 1 tablet (150 mg total) by mouth once for 1 dose.    Dispense:  1 tablet    Refill:  3   metroNIDAZOLE (FLAGYL) 500 MG tablet    Sig: Take 4 tablets (2,000 mg total) by mouth once for 1 dose.    Dispense:  4 tablet    Refill:  0     Follow-up: Return in about 6 months (around 07/10/2022).  Scarlette Calico, MD

## 2022-01-12 DIAGNOSIS — Z940 Kidney transplant status: Secondary | ICD-10-CM | POA: Diagnosis not present

## 2022-01-12 LAB — CHLAMYDIA/GONOCOCCUS/TRICHOMONAS, NAA
Chlamydia by NAA: NEGATIVE
Gonococcus by NAA: NEGATIVE
Trich vag by NAA: NEGATIVE

## 2022-01-19 MED ORDER — ROSUVASTATIN CALCIUM 5 MG PO TABS: 5.0000 mg | ORAL_TABLET | Freq: Every day | ORAL | 1 refills | Status: DC

## 2022-01-24 DIAGNOSIS — G894 Chronic pain syndrome: Secondary | ICD-10-CM | POA: Diagnosis not present

## 2022-01-24 DIAGNOSIS — Z79899 Other long term (current) drug therapy: Secondary | ICD-10-CM | POA: Diagnosis not present

## 2022-01-24 DIAGNOSIS — G546 Phantom limb syndrome with pain: Secondary | ICD-10-CM | POA: Diagnosis not present

## 2022-01-24 DIAGNOSIS — E559 Vitamin D deficiency, unspecified: Secondary | ICD-10-CM | POA: Diagnosis not present

## 2022-01-24 DIAGNOSIS — R030 Elevated blood-pressure reading, without diagnosis of hypertension: Secondary | ICD-10-CM | POA: Diagnosis not present

## 2022-01-24 DIAGNOSIS — Z0000 Encounter for general adult medical examination without abnormal findings: Secondary | ICD-10-CM | POA: Diagnosis not present

## 2022-01-27 DIAGNOSIS — Z9483 Pancreas transplant status: Secondary | ICD-10-CM | POA: Diagnosis not present

## 2022-01-27 DIAGNOSIS — I129 Hypertensive chronic kidney disease with stage 1 through stage 4 chronic kidney disease, or unspecified chronic kidney disease: Secondary | ICD-10-CM | POA: Diagnosis not present

## 2022-01-27 DIAGNOSIS — D751 Secondary polycythemia: Secondary | ICD-10-CM | POA: Diagnosis not present

## 2022-01-27 DIAGNOSIS — Z940 Kidney transplant status: Secondary | ICD-10-CM | POA: Diagnosis not present

## 2022-01-27 DIAGNOSIS — N183 Chronic kidney disease, stage 3 unspecified: Secondary | ICD-10-CM | POA: Diagnosis not present

## 2022-01-27 DIAGNOSIS — Z79899 Other long term (current) drug therapy: Secondary | ICD-10-CM | POA: Diagnosis not present

## 2022-01-27 DIAGNOSIS — E7800 Pure hypercholesterolemia, unspecified: Secondary | ICD-10-CM | POA: Diagnosis not present

## 2022-02-09 DIAGNOSIS — Z940 Kidney transplant status: Secondary | ICD-10-CM | POA: Diagnosis not present

## 2022-02-22 DIAGNOSIS — Z79899 Other long term (current) drug therapy: Secondary | ICD-10-CM | POA: Diagnosis not present

## 2022-02-22 DIAGNOSIS — E559 Vitamin D deficiency, unspecified: Secondary | ICD-10-CM | POA: Diagnosis not present

## 2022-02-22 DIAGNOSIS — G894 Chronic pain syndrome: Secondary | ICD-10-CM | POA: Diagnosis not present

## 2022-02-22 DIAGNOSIS — G546 Phantom limb syndrome with pain: Secondary | ICD-10-CM | POA: Diagnosis not present

## 2022-02-22 DIAGNOSIS — R030 Elevated blood-pressure reading, without diagnosis of hypertension: Secondary | ICD-10-CM | POA: Diagnosis not present

## 2022-03-24 DIAGNOSIS — E559 Vitamin D deficiency, unspecified: Secondary | ICD-10-CM | POA: Diagnosis not present

## 2022-03-24 DIAGNOSIS — G894 Chronic pain syndrome: Secondary | ICD-10-CM | POA: Diagnosis not present

## 2022-03-24 DIAGNOSIS — Z79899 Other long term (current) drug therapy: Secondary | ICD-10-CM | POA: Diagnosis not present

## 2022-03-24 DIAGNOSIS — G546 Phantom limb syndrome with pain: Secondary | ICD-10-CM | POA: Diagnosis not present

## 2022-03-24 DIAGNOSIS — R030 Elevated blood-pressure reading, without diagnosis of hypertension: Secondary | ICD-10-CM | POA: Diagnosis not present

## 2022-04-05 DIAGNOSIS — Z940 Kidney transplant status: Secondary | ICD-10-CM | POA: Diagnosis not present

## 2022-04-22 DIAGNOSIS — G894 Chronic pain syndrome: Secondary | ICD-10-CM | POA: Diagnosis not present

## 2022-04-22 DIAGNOSIS — G546 Phantom limb syndrome with pain: Secondary | ICD-10-CM | POA: Diagnosis not present

## 2022-04-22 DIAGNOSIS — E559 Vitamin D deficiency, unspecified: Secondary | ICD-10-CM | POA: Diagnosis not present

## 2022-04-22 DIAGNOSIS — Z79899 Other long term (current) drug therapy: Secondary | ICD-10-CM | POA: Diagnosis not present

## 2022-04-22 DIAGNOSIS — R030 Elevated blood-pressure reading, without diagnosis of hypertension: Secondary | ICD-10-CM | POA: Diagnosis not present

## 2022-05-13 DIAGNOSIS — Z940 Kidney transplant status: Secondary | ICD-10-CM | POA: Diagnosis not present

## 2022-05-23 DIAGNOSIS — G546 Phantom limb syndrome with pain: Secondary | ICD-10-CM | POA: Diagnosis not present

## 2022-05-23 DIAGNOSIS — R030 Elevated blood-pressure reading, without diagnosis of hypertension: Secondary | ICD-10-CM | POA: Diagnosis not present

## 2022-05-23 DIAGNOSIS — Z79899 Other long term (current) drug therapy: Secondary | ICD-10-CM | POA: Diagnosis not present

## 2022-05-23 DIAGNOSIS — G894 Chronic pain syndrome: Secondary | ICD-10-CM | POA: Diagnosis not present

## 2022-05-23 DIAGNOSIS — E559 Vitamin D deficiency, unspecified: Secondary | ICD-10-CM | POA: Diagnosis not present

## 2022-05-24 ENCOUNTER — Ambulatory Visit (INDEPENDENT_AMBULATORY_CARE_PROVIDER_SITE_OTHER): Payer: Medicare Other | Admitting: Family

## 2022-05-24 DIAGNOSIS — Z89512 Acquired absence of left leg below knee: Secondary | ICD-10-CM | POA: Diagnosis not present

## 2022-05-24 DIAGNOSIS — Z89511 Acquired absence of right leg below knee: Secondary | ICD-10-CM | POA: Diagnosis not present

## 2022-05-24 NOTE — Progress Notes (Signed)
Office Visit Note   Patient: [name]           Date of Birth: 28-Sep-1976           MRN: [id number] Visit Date: 05/24/2022              Requested by: Janith Lima, MD [address] [place],  [place] [address] PCP: Janith Lima, MD  Chief Complaint  Patient presents with   Right Knee - Follow-up    Hx right BKA 05/2020      HPI: The patient is a 46 year old gentleman who is status post bilateral below-knee amputations he presents today without any physical complaints but would like a permanent handicap disability parking placard.  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  Plan: Follow-up in the office on an as-needed basis discussed importance of close monitoring of his skin.  Given a handicap parking placard.  Follow-Up Instructions: No follow-ups on file.   Ortho Exam  Patient is alert, oriented, no adenopathy, well-dressed, normal affect, normal respiratory effort. On examination bilateral below-knee amputations residual limbs are well consolidated well-healed there is no open area no erythema no warmth  Imaging: No results found. No images are attached to the encounter.  Labs: Lab Results  Component Value Date   HGBA1C 5.7 06/16/2021   HGBA1C 5.4 10/26/2017   HGBA1C 5.4 12/31/2016   CRP 0.7 01/20/2020   CRP 0.7 01/19/2020   CRP 1.1 (H) 01/18/2020   LABURIC 5.6 10/23/2020   REPTSTATUS 05/17/2017 FINAL 05/14/2017   GRAMSTAIN  05/14/2017    FEW WBC PRESENT, PREDOMINANTLY PMN RARE GRAM POSITIVE COCCI IN PAIRS    CULT NORMAL SKIN FLORA 05/14/2017     Lab Results  Component Value Date   ALBUMIN 3.4 (L) 06/19/2020   ALBUMIN 4.1 02/26/2020   ALBUMIN 3.9 01/16/2020    Lab Results  Component Value Date    MG 2.1 01/20/2020   MG 1.7 01/19/2020   MG 1.7 01/18/2020   No results found for: VD25OH  No results found for: PREALBUMIN    Latest Ref Rng & Units 09/26/2021   12:00 AM 06/16/2021   12:00 AM 06/19/2020    3:12 PM  CBC EXTENDED  WBC  7.5      7.6      7.4    RBC 4.22 - 5.81 MIL/uL   5.02    Hemoglobin 13.5 - 17.5 15.6      14.9      15.3    HCT 41 - 53 45      47      48.5    Platelets 150 - 399 263      234      269       This result is from an external source.     There is no height or weight on file to calculate BMI.  Orders:  No orders of the defined types were placed in this encounter.  No orders of the defined types were placed in this encounter.    Procedures: No procedures performed  Clinical Data: No additional findings.  ROS:  All other systems negative, except as noted in the HPI. Review of Systems  Objective: Vital  Signs: There were no vitals taken for this visit.  Specialty Comments:  No specialty comments available.  PMFS History: Patient Active Problem List   Diagnosis Date Noted   Screen for colon cancer 01/10/2022   Balanitis 01/10/2022   Encounter for general adult medical examination with abnormal findings 01/10/2022   Screening for prostate cancer 01/10/2022   Abnormal EKG 10/26/2017   Hypertension 08/22/2017   Hypertensive urgency 12/29/2016   Healthcare maintenance 08/26/2016   Impaired mobility and activities of daily living 07/03/2014   Renal transplant, status post 07/19/2013   History of pancreas transplant ([place]) 07/19/2013   Immunosuppressed status ([place]) 06/27/2013   H/O pancreas transplant ([place]) 06/27/2013   Diabetic gastroparesis associated with type 1 diabetes mellitus ([place]) 05/08/2013   S/P bilateral BKA (below knee amputation) ([place]) 93/81/8299   Metabolic bone disease 37/16/9678   ESRD (end stage renal disease) ([place]) 11/17/2009   GERD 03/14/2007   Dyslipidemia, goal LDL below 100 02/22/2007   Major depressive  disorder, recurrent episode ([place]) 02/22/2007   POST TRAUMATIC STRESS DISORDER 02/22/2007   IMPOTENCE, 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   Diabetes mellitus without complication [facility])    no since pancreas transplant   Dialysis patient [facility]) 04/18/12   "[facility]; [place], [place], [place]"   Edema    ESRD (end stage renal disease) on dialysis ([place])    Gastroparesis    Gastropathy    GERD (gastroesophageal reflux disease)    Headache(784.0)    Hypertension    MRSA infection    over 10 years ago per patient. in legs    Family History  Problem Relation Age of Onset   Diabetes Other    Hypertension Other    Hypertension Mother    Diabetes Mother    Kidney disease Mother    Diabetes Maternal Grandmother    Diabetes Paternal Grandmother    Colon cancer Neg Hx    Esophageal cancer Neg Hx    Rectal cancer Neg Hx    Stomach cancer Neg Hx     Past Surgical History:  Procedure Laterality Date   AV FISTULA PLACEMENT  08/2011   left upper arm   BELOW KNEE LEG AMPUTATION  "it's been awhile"   bilaterally   CATARACT EXTRACTION  ~ 2011   right   COMBINED KIDNEY-PANCREAS TRANSPLANT  2014   ESOPHAGOGASTRODUODENOSCOPY N/A 12/30/2016   Procedure: ESOPHAGOGASTRODUODENOSCOPY (EGD);  Surgeon: Carol Ada, MD;  Location[facility] ENDOSCOPY;  Service: Endoscopy;  Laterality: N/A;   OLECRANON BURSECTOMY Right 06/23/2020   Procedure: RIGHT ELBOW EXCISION OLECRANON BURSITIS;  Surgeon: Newt Minion, MD;  Location: [place];  Service: Orthopedics;  Laterality: Right;   Social History   Occupational History   Occupation: Disability  Tobacco Use   Smoking status: Some Days    Types: Cigars   Smokeless tobacco: Never   Tobacco comments:     black and mild  Substance and Sexual Activity   Alcohol use: No   Drug use: Yes    Frequency: 3.0 times per week    Types: Marijuana    Comment: Occasionally   Sexual activity: Yes    Partners: Female

## 2022-06-07 DIAGNOSIS — D751 Secondary polycythemia: Secondary | ICD-10-CM | POA: Diagnosis not present

## 2022-06-07 DIAGNOSIS — Z940 Kidney transplant status: Secondary | ICD-10-CM | POA: Diagnosis not present

## 2022-06-07 DIAGNOSIS — I129 Hypertensive chronic kidney disease with stage 1 through stage 4 chronic kidney disease, or unspecified chronic kidney disease: Secondary | ICD-10-CM | POA: Diagnosis not present

## 2022-06-07 DIAGNOSIS — Z9483 Pancreas transplant status: Secondary | ICD-10-CM | POA: Diagnosis not present

## 2022-06-07 DIAGNOSIS — Z79899 Other long term (current) drug therapy: Secondary | ICD-10-CM | POA: Diagnosis not present

## 2022-06-07 DIAGNOSIS — R799 Abnormal finding of blood chemistry, unspecified: Secondary | ICD-10-CM | POA: Diagnosis not present

## 2022-06-07 DIAGNOSIS — N183 Chronic kidney disease, stage 3 unspecified: Secondary | ICD-10-CM | POA: Diagnosis not present

## 2022-06-07 DIAGNOSIS — E7800 Pure hypercholesterolemia, unspecified: Secondary | ICD-10-CM | POA: Diagnosis not present

## 2022-06-22 ENCOUNTER — Other Ambulatory Visit: Payer: Self-pay

## 2022-06-22 DIAGNOSIS — G894 Chronic pain syndrome: Secondary | ICD-10-CM | POA: Diagnosis not present

## 2022-06-22 DIAGNOSIS — R030 Elevated blood-pressure reading, without diagnosis of hypertension: Secondary | ICD-10-CM | POA: Diagnosis not present

## 2022-06-22 DIAGNOSIS — Z79899 Other long term (current) drug therapy: Secondary | ICD-10-CM | POA: Diagnosis not present

## 2022-06-22 DIAGNOSIS — G546 Phantom limb syndrome with pain: Secondary | ICD-10-CM | POA: Diagnosis not present

## 2022-06-22 MED ORDER — OXYCODONE HCL 15 MG PO TABS
ORAL_TABLET | ORAL | 0 refills | Status: DC
  Filled 2022-06-22: qty 150, 30d supply, fill #0

## 2022-06-23 ENCOUNTER — Other Ambulatory Visit: Payer: Self-pay

## 2022-06-24 DIAGNOSIS — Z940 Kidney transplant status: Secondary | ICD-10-CM | POA: Diagnosis not present

## 2022-06-27 DIAGNOSIS — Z79899 Other long term (current) drug therapy: Secondary | ICD-10-CM | POA: Diagnosis not present

## 2022-07-05 DIAGNOSIS — E1169 Type 2 diabetes mellitus with other specified complication: Secondary | ICD-10-CM | POA: Diagnosis not present

## 2022-07-22 ENCOUNTER — Other Ambulatory Visit: Payer: Self-pay

## 2022-07-22 DIAGNOSIS — Z79899 Other long term (current) drug therapy: Secondary | ICD-10-CM | POA: Diagnosis not present

## 2022-07-22 DIAGNOSIS — G546 Phantom limb syndrome with pain: Secondary | ICD-10-CM | POA: Diagnosis not present

## 2022-07-22 DIAGNOSIS — R030 Elevated blood-pressure reading, without diagnosis of hypertension: Secondary | ICD-10-CM | POA: Diagnosis not present

## 2022-07-22 DIAGNOSIS — Z9181 History of falling: Secondary | ICD-10-CM | POA: Diagnosis not present

## 2022-07-22 DIAGNOSIS — G894 Chronic pain syndrome: Secondary | ICD-10-CM | POA: Diagnosis not present

## 2022-07-22 MED ORDER — OXYCODONE HCL 15 MG PO TABS
ORAL_TABLET | ORAL | 0 refills | Status: DC
  Filled 2022-07-22: qty 150, 30d supply, fill #0

## 2022-07-25 ENCOUNTER — Other Ambulatory Visit: Payer: Self-pay

## 2022-07-26 DIAGNOSIS — Z940 Kidney transplant status: Secondary | ICD-10-CM | POA: Diagnosis not present

## 2022-08-22 ENCOUNTER — Other Ambulatory Visit: Payer: Self-pay

## 2022-08-22 DIAGNOSIS — G546 Phantom limb syndrome with pain: Secondary | ICD-10-CM | POA: Diagnosis not present

## 2022-08-22 DIAGNOSIS — G894 Chronic pain syndrome: Secondary | ICD-10-CM | POA: Diagnosis not present

## 2022-08-22 DIAGNOSIS — Z79899 Other long term (current) drug therapy: Secondary | ICD-10-CM | POA: Diagnosis not present

## 2022-08-22 DIAGNOSIS — Z9181 History of falling: Secondary | ICD-10-CM | POA: Diagnosis not present

## 2022-08-22 DIAGNOSIS — R030 Elevated blood-pressure reading, without diagnosis of hypertension: Secondary | ICD-10-CM | POA: Diagnosis not present

## 2022-08-22 MED ORDER — NALOXONE HCL 4 MG/0.1ML NA LIQD: NASAL | 2 refills | Status: DC

## 2022-08-22 MED ORDER — OXYCODONE HCL 15 MG PO TABS
ORAL_TABLET | ORAL | 0 refills | Status: DC
  Filled 2022-08-22: qty 150, 30d supply, fill #0

## 2022-08-25 DIAGNOSIS — Z940 Kidney transplant status: Secondary | ICD-10-CM | POA: Diagnosis not present

## 2022-09-06 ENCOUNTER — Ambulatory Visit (INDEPENDENT_AMBULATORY_CARE_PROVIDER_SITE_OTHER): Payer: Medicare Other

## 2022-09-06 VITALS — Ht 71.0 in | Wt 210.0 lb

## 2022-09-06 DIAGNOSIS — Z0000 Encounter for general adult medical examination without abnormal findings: Secondary | ICD-10-CM

## 2022-09-06 NOTE — Patient Instructions (Signed)
[name] , Thank you for taking time to come for your Medicare Wellness Visit. I appreciate your ongoing commitment to your health goals. Please review the following plan we discussed and let me know if I can assist you in the future.   Screening recommendations/referrals: Colonoscopy: Never done Recommended yearly ophthalmology/optometry visit for glaucoma screening and checkup Recommended yearly dental visit for hygiene and checkup  Vaccinations: Influenza vaccine: 10/26/2021; due Fall 2023 Pneumococcal vaccine: 01/27/2012, 01/10/2022 Tdap vaccine: 04/03/2017; due every 10 years Shingles vaccine: due at age 48   Covid-19: 03/11/2020, 04/01/2020, 08/13/2020  Advanced directives: No  Conditions/risks identified: Yes  Next appointment: 09/08/2023 at 3:45 pm via telephone with Nurse [place] 40-64 Years, Male Preventive care refers to lifestyle choices and visits with your health care provider that can promote health and wellness. What does preventive care include? A yearly physical exam. This is also called an annual well check. Dental exams once or twice a year. Routine eye exams. Ask your health care provider how often you should have your eyes checked. Personal lifestyle choices, including: Daily care of your teeth and gums. Regular physical activity. Eating a healthy diet. Avoiding tobacco and drug use. Limiting alcohol use. Practicing safe sex. Taking low-dose aspirin every day starting at age 48. What happens during an annual well check? The services and screenings done by your health care provider during your annual well check will depend on your age, overall health, lifestyle risk factors, and family history of disease. Counseling  Your health care provider may ask you questions about your: Alcohol use. Tobacco use. Drug use. Emotional well-being. Home and relationship well-being. Sexual activity. Eating habits. Work and work Statistician. Screening  You  may have the following tests or measurements: Height, weight, and BMI. Blood pressure. Lipid and cholesterol levels. These may be checked every 5 years, or more frequently if you are over 34 years old. Skin check. Lung cancer screening. You may have this screening every year starting at age 67 if you have a 30-pack-year history of smoking and currently smoke or have quit within the past 15 years. Fecal occult blood test (FOBT) of the stool. You may have this test every year starting at age 44. Flexible sigmoidoscopy or colonoscopy. You may have a sigmoidoscopy every 5 years or a colonoscopy every 10 years starting at age 20. Prostate cancer screening. Recommendations will vary depending on your family history and other risks. Hepatitis C blood test. Hepatitis B blood test. Sexually transmitted disease (STD) testing. Diabetes screening. This is done by checking your blood sugar (glucose) after you have not eaten for a while (fasting). You may have this done every 1-3 years. Discuss your test results, treatment options, and if necessary, the need for more tests with your health care provider. Vaccines  Your health care provider may recommend certain vaccines, such as: Influenza vaccine. This is recommended every year. Tetanus, diphtheria, and acellular pertussis (Tdap, Td) vaccine. You may need a Td booster every 10 years. Zoster vaccine. You may need this after age 48. Pneumococcal 13-valent conjugate (PCV13) vaccine. You may need this if you have certain conditions and have not been vaccinated. Pneumococcal polysaccharide (PPSV23) vaccine. You may need one or two doses if you smoke cigarettes or if you have certain conditions. Talk to your health care provider about which screenings and vaccines you need and how often you need them. This information is not intended to replace advice given to you by your health care provider. Make sure  you discuss any questions you have with your health care  provider. Document Released: 01/08/2016 Document Revised: 08/31/2016 Document Reviewed: 10/13/2015 Elsevier Interactive Patient Education  2017 [place] Prevention in the Home Falls can cause injuries. They can happen to people of all ages. There are many things you can do to make your home safe and to help prevent falls. What can I do on the outside of my home? Regularly fix the edges of walkways and driveways and fix any cracks. Remove anything that might make you trip as you walk through a door, such as a raised step or threshold. Trim any bushes or trees on the path to your home. Use bright outdoor lighting. Clear any walking paths of anything that might make someone trip, such as rocks or tools. Regularly check to see if handrails are loose or broken. Make sure that both sides of any steps have handrails. Any raised decks and porches should have guardrails on the edges. Have any leaves, snow, or ice cleared regularly. Use sand or salt on walking paths during winter. Clean up any spills in your garage right away. This includes oil or grease spills. What can I do in the bathroom? Use night lights. Install grab bars by the toilet and in the tub and shower. Do not use towel bars as grab bars. Use non-skid mats or decals in the tub or shower. If you need to sit down in the shower, use a plastic, non-slip stool. Keep the floor dry. Clean up any water that spills on the floor as soon as it happens. Remove soap buildup in the tub or shower regularly. Attach bath mats securely with double-sided non-slip rug tape. Do not have throw rugs and other things on the floor that can make you trip. What can I do in the bedroom? Use night lights. Make sure that you have a light by your bed that is easy to reach. Do not use any sheets or blankets that are too big for your bed. They should not hang down onto the floor. Have a firm chair that has side arms. You can use this for support while  you get dressed. Do not have throw rugs and other things on the floor that can make you trip. What can I do in the kitchen? Clean up any spills right away. Avoid walking on wet floors. Keep items that you use a lot in easy-to-reach places. If you need to reach something above you, use a strong step stool that has a grab bar. Keep electrical cords out of the way. Do not use floor polish or wax that makes floors slippery. If you must use wax, use non-skid floor wax. Do not have throw rugs and other things on the floor that can make you trip. What can I do with my stairs? Do not leave any items on the stairs. Make sure that there are handrails on both sides of the stairs and use them. Fix handrails that are broken or loose. Make sure that handrails are as long as the stairways. Check any carpeting to make sure that it is firmly attached to the stairs. Fix any carpet that is loose or worn. Avoid having throw rugs at the top or bottom of the stairs. If you do have throw rugs, attach them to the floor with carpet tape. Make sure that you have a light switch at the top of the stairs and the bottom of the stairs. If you do not have them, ask someone  to add them for you. What else can I do to help prevent falls? Wear shoes that: Do not have high heels. Have rubber bottoms. Are comfortable and fit you well. Are closed at the toe. Do not wear sandals. If you use a stepladder: Make sure that it is fully opened. Do not climb a closed stepladder. Make sure that both sides of the stepladder are locked into place. Ask someone to hold it for you, if possible. Clearly mark and make sure that you can see: Any grab bars or handrails. First and last steps. Where the edge of each step is. Use tools that help you move around (mobility aids) if they are needed. These include: Canes. Walkers. Scooters. Crutches. Turn on the lights when you go into a dark area. Replace any light bulbs as soon as they burn  out. Set up your furniture so you have a clear path. Avoid moving your furniture around. If any of your floors are uneven, fix them. If there are any pets around you, be aware of where they are. Review your medicines with your doctor. Some medicines can make you feel dizzy. This can increase your chance of falling. Ask your doctor what other things that you can do to help prevent falls. This information is not intended to replace advice given to you by your health care provider. Make sure you discuss any questions you have with your health care provider. Document Released: 10/08/2009 Document Revised: 05/19/2016 Document Reviewed: 01/16/2015 Elsevier Interactive Patient Education  2017 [employer].

## 2022-09-06 NOTE — Progress Notes (Signed)
Subjective:   [name] is a 46 y.o. male who presents for Medicare Annual/Subsequent preventive examination.  Review of Systems    Virtual Visit via Telephone Note  I connected with  HAMZA EMPSON on 09/06/22 at  4:00 PM EDT by telephone and verified that I am speaking with the correct person using two identifiers.  Location: Patient: Home Provider: [place] Persons participating in the virtual visit: [place]   I discussed the limitations, risks, security and privacy concerns of performing an evaluation and management service by telephone and the availability of in person appointments. The patient expressed understanding and agreed to proceed.  Interactive audio and video telecommunications were attempted between this nurse and patient, however failed, due to patient having technical difficulties OR patient did not have access to video capability.  We continued and completed visit with audio only.  Some vital signs may be absent or patient reported.   Sheral Flow, LPN  Cardiac Risk Factors include: advanced age (>91mn, >>73women);dyslipidemia     Objective:    Today's Vitals   09/06/22 1604  Weight: 210 lb (95.3 kg)  Height: '5\' 11"'$  (1.803 m)   Body mass index is 29.29 kg/m.     09/06/2022    4:18 PM 06/23/2020    7:01 AM 06/17/2020   10:00 AM 02/26/2020    2:44 AM 01/16/2020    7:32 AM 10/27/2017    1:00 AM 10/25/2017    6:22 PM  Advanced Directives  Does Patient Have a Medical Advance Directive? No No No No No No No  Would patient like information on creating a medical advance directive? No - Patient declined No - Patient declined No - Patient declined No - Patient declined No - Patient declined No - Patient declined No - Patient declined    Current Medications (verified) Outpatient Encounter Medications as of 09/06/2022  Medication Sig   aspirin EC 81 MG tablet Take 81 mg by mouth daily.   BELATACEPT IV Inject into  the vein.   hydrALAZINE (APRESOLINE) 50 MG tablet Take 1 tablet (50 mg total) by mouth 3 (three) times daily. (Patient taking differently: Take 75 mg by mouth 2 (two) times daily.)   labetalol (NORMODYNE) 200 MG tablet TK 1 T PO BID   metoCLOPramide (REGLAN) 10 MG tablet Take 1 tablet (10 mg total) by mouth 4 (four) times daily. Take 1tab 20-30 minutes before breakfast, lunch, dinner and at bedtime   mycophenolate (MYFORTIC) 180 MG EC tablet Take 360 mg by mouth 2 (two) times daily.   naloxone (NARCAN) nasal spray 4 mg/0.1 mL 1 (one) Spray as needed   omeprazole (PRILOSEC) 40 MG capsule Take 1 capsule (40 mg total) by mouth 2 (two) times daily before a meal.   ondansetron (ZOFRAN ODT) 4 MG disintegrating tablet Take 1 tablet (4 mg total) by mouth every 8 (eight) hours as needed for nausea or vomiting.   oxyCODONE (ROXICODONE) 15 MG immediate release tablet Take 1 (one) Tablet 5 times daily as needed   OxyCODONE HCl 15 MG TABA Take 15 mg by mouth 4 (four) times daily.   predniSONE (DELTASONE) 5 MG tablet Take 5 mg by mouth daily.   promethazine (PHENERGAN) 25 MG tablet Take 25 mg by mouth every 6 (six) hours as needed for nausea or vomiting.   rosuvastatin (CRESTOR) 5 MG tablet Take 1 tablet (5 mg total) by mouth daily.   saccharomyces boulardii (FLORASTOR) 250 MG capsule Take 250 mg by  mouth 2 (two) times daily.   sucralfate (CARAFATE) 1 GM/10ML suspension Take 10 mLs (1 g total) by mouth 4 (four) times daily -  with meals and at bedtime for 21 days.   sulfamethoxazole-trimethoprim (BACTRIM,SEPTRA) 400-80 MG per tablet Take 1 tablet by mouth every Monday, Wednesday, and Friday.    tacrolimus (PROGRAF) 1 MG capsule Take 2-3 mg by mouth 2 (two) times daily. 3 mg in the morning, 2 mg in the evening   No facility-administered encounter medications on file as of 09/06/2022.    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   Diabetes mellitus without complication [facility])    no since pancreas transplant   Dialysis patient [facility]) 04/18/12   "[facility]; [place], [place], Sat"   Edema    ESRD (end stage renal disease) on dialysis ([place])    Gastroparesis    Gastropathy    GERD (gastroesophageal reflux disease)    Headache(784.0)    Hypertension    MRSA infection    over 10 years ago per patient. in legs   Past Surgical History:  Procedure Laterality Date   AV FISTULA PLACEMENT  08/2011   left upper arm   BELOW KNEE LEG AMPUTATION  "it's been awhile"   bilaterally   CATARACT EXTRACTION  ~ 2011   right   COMBINED KIDNEY-PANCREAS TRANSPLANT  2014   ESOPHAGOGASTRODUODENOSCOPY N/A 12/30/2016   Procedure: ESOPHAGOGASTRODUODENOSCOPY (EGD);  Surgeon: Carol Ada, MD;  Location: [facility] ENDOSCOPY;  Service: Endoscopy;  Laterality: N/A;   OLECRANON BURSECTOMY Right 06/23/2020   Procedure: RIGHT ELBOW EXCISION OLECRANON BURSITIS;  Surgeon: Newt Minion, MD;  Location: [place];  Service: Orthopedics;  Laterality: Right;   Family History  Problem Relation Age of Onset   Diabetes Other    Hypertension Other    Hypertension Mother    Diabetes Mother    Kidney disease Mother    Diabetes Maternal Grandmother    Diabetes Paternal Grandmother    Colon cancer Neg Hx    Esophageal cancer Neg Hx    Rectal cancer Neg Hx    Stomach cancer Neg Hx    Social History   Socioeconomic History   Marital status: Married    Spouse name: Not on file   Number of children: 1   Years of education: 11   Highest education level: Not on file  Occupational History   Occupation: Disability  Tobacco Use   Smoking status: Former    Types: Cigars    Quit date: 07/06/2022    Years since quitting: 0.1   Smokeless  tobacco: Never   Tobacco comments:    black and mild  Substance and Sexual Activity   Alcohol use: No   Drug use: Yes    Frequency: 3.0 times per week    Types: Marijuana    Comment: Occasionally   Sexual activity: Yes    Partners: Female  Other Topics Concern   Not on file  Social History Narrative   Fun: Restore old cars    Social Determinants of Health   Financial Resource Strain: Low Risk  (09/06/2022)   Overall Emergency planning/management officer Strain (  CARDIA)    Difficulty of Paying Living Expenses: Not hard at all  Food Insecurity: No Food Insecurity (09/06/2022)   Hunger Vital Sign    Worried About Running Out of Food in the Last Year: Never true    Ran Out of Food in the Last Year: Never true  Transportation Needs: No Transportation Needs (09/06/2022)   PRAPARE - Hydrologist (Medical): No    Lack of Transportation (Non-Medical): No  Physical Activity: Inactive (09/06/2022)   Exercise Vital Sign    Days of Exercise per Week: 0 days    Minutes of Exercise per Session: 0 min  Stress: No Stress Concern Present (09/06/2022)   [place]    Feeling of Stress : Not at all  Social Connections: [place] (09/06/2022)   Social Connection and Isolation Panel [NHANES]    Frequency of Communication with Friends and Family: More than three times a week    Frequency of Social Gatherings with Friends and Family: More than three times a week    Attends Religious Services: More than 4 times per year    Active Member of [employer] or Organizations: Yes    Attends Music therapist: More than 4 times per year    Marital Status: Married    Tobacco Counseling Counseling given: Not Answered Tobacco comments: black and mild   Clinical Intake:  Pre-visit preparation completed: Yes  Pain : No/denies pain     BMI - recorded: 29.29 Nutritional Status: BMI 25 -29 Overweight Nutritional  Risks: None Diabetes: Yes CBG done?: No Did pt. bring in CBG monitor from home?: No  How often do you need to have someone help you when you read instructions, pamphlets, or other written materials from your doctor or pharmacy?: 1 - Never What is the last grade level you completed in school?: 11th grade  Diabetic? no  Interpreter Needed?: No  Information entered by :: Lisette Abu, LPN.   Activities of Daily Living    09/06/2022    4:13 PM  In your present state of health, do you have any difficulty performing the following activities:  Hearing? 0  Vision? 0  Difficulty concentrating or making decisions? 0  Walking or climbing stairs? 0  Dressing or bathing? 0  Doing errands, shopping? 0  Preparing Food and eating ? N  Using the Toilet? N  In the past six months, have you accidently leaked urine? N  Do you have problems with loss of bowel control? N  Managing your Medications? N  Managing your Finances? N  Housekeeping or managing your Housekeeping? N    Patient Care Team: Janith Lima, MD as PCP - General (Internal Medicine) Jolyn Nap, MD as Referring Physician (Ophthalmology)  Indicate any recent Medical Services you may have received from other than [facility] providers in the past year (date may be approximate).     Assessment:   This is a routine wellness examination for [place].  Hearing/Vision screen Hearing Screening - Comments:: Denies hearing difficulties   Vision Screening - Comments:: Wears rx glasses - up to date with routine eye exams with [facility] (Dr. Hassell Done)   Dietary issues and exercise activities discussed: Current Exercise Habits: Home exercise routine, Exercise limited by: None identified   Goals Addressed   None   Depression Screen    09/06/2022    4:08 PM 08/26/2016   10:04 AM 04/22/2016    9:41 AM 12/14/2015  11:56 AM 07/29/2014    1:56 PM  PHQ 2/9 Scores  PHQ - 2 Score 0 0 0 0 0    Fall Risk    09/06/2022     4:07 PM 07/29/2014    1:56 PM 07/02/2014    2:30 PM  [place] in the past year? 0 No Yes  Number falls in past yr: 0    Injury with Fall? 0    Risk for fall due to : No Fall Risks    Follow up Falls prevention discussed      [place]:  Any stairs in or around the home? Yes  If so, are there any without handrails? Yes  Home free of loose throw rugs in walkways, pet beds, electrical cords, etc? No  Adequate lighting in your home to reduce risk of falls? No   ASSISTIVE DEVICES UTILIZED TO PREVENT FALLS:  Life alert? No  Use of a cane, walker or w/c? No  Grab bars in the bathroom? No  Shower chair or bench in shower? No  Elevated toilet seat or a handicapped toilet? No   TIMED UP AND GO:  Was the test performed? No .  Length of time to ambulate 10 feet: n/a sec.   Appearance of gait: Gait not evaluated during this visit.  Cognitive Function:        09/06/2022    4:13 PM  6CIT Screen  What Year? 0 points  What month? 0 points  What time? 0 points  Count back from 20 0 points  Months in reverse 0 points  Repeat phrase 0 points  Total Score 0 points    Immunizations Immunization History  Administered Date(s) Administered   Influenza Whole 09/26/2008, 12/07/2009   Influenza,inj,Quad PF,6+ Mos 09/10/2014, 11/03/2015, 08/26/2016, 08/22/2017, 09/12/2018   Influenza-Unspecified 10/26/2021   PFIZER(Purple Top)SARS-COV-2 Vaccination 03/11/2020, 04/01/2020, 08/13/2020   PNEUMOCOCCAL CONJUGATE-20 01/10/2022   Pneumococcal Polysaccharide-23 11/26/1999   Pneumococcal-Unspecified 01/27/2012   Td 05/26/1998, 07/26/2010   Tdap 04/03/2017    TDAP status: Up to date  Flu Vaccine status: Due, Education has been provided regarding the importance of this vaccine. Advised may receive this vaccine at local pharmacy or Health Dept. Aware to provide a copy of the vaccination record if obtained from local pharmacy or Health Dept. Verbalized  acceptance and understanding.  Pneumococcal vaccine status: Up to date  Covid-19 vaccine status: Completed vaccines  Qualifies for Shingles Vaccine? No   Zostavax completed No   Shingrix Completed?: No.    Education has been provided regarding the importance of this vaccine. Patient has been advised to call insurance company to determine out of pocket expense if they have not yet received this vaccine. Advised may also receive vaccine at local pharmacy or Health Dept. Verbalized acceptance and understanding.  Screening Tests Health Maintenance  Topic Date Due   COLONOSCOPY (Pts 45-22yr Insurance coverage will need to be confirmed)  Never done   COVID-19 Vaccine (4 - Pfizer risk series) 10/08/2020   HEMOGLOBIN A1C  12/16/2021   INFLUENZA VACCINE  07/26/2022   OPHTHALMOLOGY EXAM  09/15/2022   TETANUS/TDAP  04/04/2027   Hepatitis C Screening  Completed   HIV Screening  Completed   HPV VACCINES  Aged Out    Health Maintenance  Health Maintenance Due  Topic Date Due   COLONOSCOPY (Pts 45-461yrInsurance coverage will need to be confirmed)  Never done   COVID-19 Vaccine (4 - Pfizer risk series) 10/08/2020  HEMOGLOBIN A1C  12/16/2021   INFLUENZA VACCINE  07/26/2022    Colorectal cancer screening: patient declined at this time  Lung Cancer Screening: (Low Dose CT Chest recommended if Age 63-80 years, 30 pack-year currently smoking OR have quit w/in 15years.) does not qualify.   Lung Cancer Screening Referral: no  Additional Screening:  Hepatitis C Screening: does qualify; Completed 04/25/2012  Vision Screening: Recommended annual ophthalmology exams for early detection of glaucoma and other disorders of the eye. Is the patient up to date with their annual eye exam?  Yes  Who is the provider or what is the name of the office in which the patient attends annual eye exams? [facility] If pt is not established with a provider, would they like to be referred to a provider to  establish care? No .   Dental Screening: Recommended annual dental exams for proper oral hygiene  Community Resource Referral / Chronic Care Management: CRR required this visit?  No   CCM required this visit?  No      Plan:     I have personally reviewed and noted the following in the patient's chart:   Medical and social history Use of alcohol, tobacco or illicit drugs  Current medications and supplements including opioid prescriptions. Patient is currently taking opioid prescriptions. Information provided to patient regarding non-opioid alternatives. Patient advised to discuss non-opioid treatment plan with their provider. Functional ability and status Nutritional status Physical activity Advanced directives List of other physicians Hospitalizations, surgeries, and ER visits in previous 12 months Vitals Screenings to include cognitive, depression, and falls Referrals and appointments  In addition, I have reviewed and discussed with patient certain preventive protocols, quality metrics, and best practice recommendations. A written personalized care plan for preventive services as well as general preventive health recommendations were provided to patient.     Sheral Flow, LPN   05/03/3266   Nurse Notes:  Patient provided weight and height for this visit. There were no vitals filed for this visit.

## 2022-09-22 ENCOUNTER — Other Ambulatory Visit: Payer: Self-pay

## 2022-09-22 DIAGNOSIS — G546 Phantom limb syndrome with pain: Secondary | ICD-10-CM | POA: Diagnosis not present

## 2022-09-22 DIAGNOSIS — Z79899 Other long term (current) drug therapy: Secondary | ICD-10-CM | POA: Diagnosis not present

## 2022-09-22 DIAGNOSIS — Z9181 History of falling: Secondary | ICD-10-CM | POA: Diagnosis not present

## 2022-09-22 DIAGNOSIS — G894 Chronic pain syndrome: Secondary | ICD-10-CM | POA: Diagnosis not present

## 2022-09-22 DIAGNOSIS — R030 Elevated blood-pressure reading, without diagnosis of hypertension: Secondary | ICD-10-CM | POA: Diagnosis not present

## 2022-09-22 MED ORDER — OXYCODONE HCL 15 MG PO TABS
15.0000 mg | ORAL_TABLET | Freq: Every day | ORAL | 0 refills | Status: DC | PRN
  Filled 2022-09-22: qty 150, 30d supply, fill #0

## 2022-09-23 DIAGNOSIS — Z940 Kidney transplant status: Secondary | ICD-10-CM | POA: Diagnosis not present

## 2022-10-21 ENCOUNTER — Other Ambulatory Visit: Payer: Self-pay

## 2022-10-21 DIAGNOSIS — R030 Elevated blood-pressure reading, without diagnosis of hypertension: Secondary | ICD-10-CM | POA: Diagnosis not present

## 2022-10-21 DIAGNOSIS — R21 Rash and other nonspecific skin eruption: Secondary | ICD-10-CM | POA: Diagnosis not present

## 2022-10-21 DIAGNOSIS — G546 Phantom limb syndrome with pain: Secondary | ICD-10-CM | POA: Diagnosis not present

## 2022-10-21 DIAGNOSIS — Z9181 History of falling: Secondary | ICD-10-CM | POA: Diagnosis not present

## 2022-10-21 DIAGNOSIS — H6121 Impacted cerumen, right ear: Secondary | ICD-10-CM | POA: Diagnosis not present

## 2022-10-21 DIAGNOSIS — Z79899 Other long term (current) drug therapy: Secondary | ICD-10-CM | POA: Diagnosis not present

## 2022-10-21 DIAGNOSIS — G894 Chronic pain syndrome: Secondary | ICD-10-CM | POA: Diagnosis not present

## 2022-10-21 MED ORDER — NALOXONE HCL 4 MG/0.1ML NA LIQD
NASAL | 2 refills | Status: DC
  Filled 2022-10-21: qty 2, 30d supply, fill #0

## 2022-10-21 MED ORDER — OXYCODONE HCL 15 MG PO TABS
15.0000 mg | ORAL_TABLET | Freq: Every day | ORAL | 0 refills | Status: DC | PRN
  Filled 2022-10-21: qty 150, 30d supply, fill #0

## 2022-11-09 DIAGNOSIS — Z940 Kidney transplant status: Secondary | ICD-10-CM | POA: Diagnosis not present

## 2022-11-15 DIAGNOSIS — D751 Secondary polycythemia: Secondary | ICD-10-CM | POA: Diagnosis not present

## 2022-11-15 DIAGNOSIS — Z940 Kidney transplant status: Secondary | ICD-10-CM | POA: Diagnosis not present

## 2022-11-15 DIAGNOSIS — N183 Chronic kidney disease, stage 3 unspecified: Secondary | ICD-10-CM | POA: Diagnosis not present

## 2022-11-15 DIAGNOSIS — R21 Rash and other nonspecific skin eruption: Secondary | ICD-10-CM | POA: Diagnosis not present

## 2022-11-15 DIAGNOSIS — N390 Urinary tract infection, site not specified: Secondary | ICD-10-CM | POA: Diagnosis not present

## 2022-11-15 DIAGNOSIS — Z23 Encounter for immunization: Secondary | ICD-10-CM | POA: Diagnosis not present

## 2022-11-15 DIAGNOSIS — Z79899 Other long term (current) drug therapy: Secondary | ICD-10-CM | POA: Diagnosis not present

## 2022-11-15 DIAGNOSIS — I129 Hypertensive chronic kidney disease with stage 1 through stage 4 chronic kidney disease, or unspecified chronic kidney disease: Secondary | ICD-10-CM | POA: Diagnosis not present

## 2022-11-15 DIAGNOSIS — R799 Abnormal finding of blood chemistry, unspecified: Secondary | ICD-10-CM | POA: Diagnosis not present

## 2022-11-15 DIAGNOSIS — Z9483 Pancreas transplant status: Secondary | ICD-10-CM | POA: Diagnosis not present

## 2022-11-15 DIAGNOSIS — E7800 Pure hypercholesterolemia, unspecified: Secondary | ICD-10-CM | POA: Diagnosis not present

## 2022-11-22 ENCOUNTER — Other Ambulatory Visit: Payer: Self-pay

## 2022-11-22 DIAGNOSIS — Z79899 Other long term (current) drug therapy: Secondary | ICD-10-CM | POA: Diagnosis not present

## 2022-11-22 DIAGNOSIS — Z9181 History of falling: Secondary | ICD-10-CM | POA: Diagnosis not present

## 2022-11-22 DIAGNOSIS — G894 Chronic pain syndrome: Secondary | ICD-10-CM | POA: Diagnosis not present

## 2022-11-22 DIAGNOSIS — R030 Elevated blood-pressure reading, without diagnosis of hypertension: Secondary | ICD-10-CM | POA: Diagnosis not present

## 2022-11-22 DIAGNOSIS — G546 Phantom limb syndrome with pain: Secondary | ICD-10-CM | POA: Diagnosis not present

## 2022-11-22 MED ORDER — OXYCODONE HCL 15 MG PO TABS
15.0000 mg | ORAL_TABLET | Freq: Every day | ORAL | 0 refills | Status: DC
  Filled 2022-11-22: qty 150, 30d supply, fill #0

## 2022-12-15 DIAGNOSIS — Z940 Kidney transplant status: Secondary | ICD-10-CM | POA: Diagnosis not present

## 2022-12-21 ENCOUNTER — Other Ambulatory Visit: Payer: Self-pay

## 2022-12-21 DIAGNOSIS — G546 Phantom limb syndrome with pain: Secondary | ICD-10-CM | POA: Diagnosis not present

## 2022-12-21 DIAGNOSIS — G894 Chronic pain syndrome: Secondary | ICD-10-CM | POA: Diagnosis not present

## 2022-12-21 DIAGNOSIS — R030 Elevated blood-pressure reading, without diagnosis of hypertension: Secondary | ICD-10-CM | POA: Diagnosis not present

## 2022-12-21 DIAGNOSIS — Z9181 History of falling: Secondary | ICD-10-CM | POA: Diagnosis not present

## 2022-12-21 DIAGNOSIS — Z79899 Other long term (current) drug therapy: Secondary | ICD-10-CM | POA: Diagnosis not present

## 2022-12-21 MED ORDER — OXYCODONE HCL 15 MG PO TABS
15.0000 mg | ORAL_TABLET | Freq: Every day | ORAL | 0 refills | Status: DC | PRN
  Filled 2022-12-22: qty 150, 30d supply, fill #0

## 2022-12-22 ENCOUNTER — Other Ambulatory Visit: Payer: Self-pay

## 2022-12-29 ENCOUNTER — Other Ambulatory Visit (HOSPITAL_COMMUNITY): Payer: Self-pay

## 2023-01-24 ENCOUNTER — Other Ambulatory Visit (HOSPITAL_COMMUNITY): Payer: Self-pay

## 2023-01-24 ENCOUNTER — Other Ambulatory Visit: Payer: Self-pay

## 2023-01-24 DIAGNOSIS — R030 Elevated blood-pressure reading, without diagnosis of hypertension: Secondary | ICD-10-CM | POA: Diagnosis not present

## 2023-01-24 DIAGNOSIS — Z9181 History of falling: Secondary | ICD-10-CM | POA: Diagnosis not present

## 2023-01-24 DIAGNOSIS — Z79899 Other long term (current) drug therapy: Secondary | ICD-10-CM | POA: Diagnosis not present

## 2023-01-24 DIAGNOSIS — G894 Chronic pain syndrome: Secondary | ICD-10-CM | POA: Diagnosis not present

## 2023-01-24 DIAGNOSIS — G546 Phantom limb syndrome with pain: Secondary | ICD-10-CM | POA: Diagnosis not present

## 2023-01-24 MED ORDER — OXYCODONE HCL 15 MG PO TABS
15.0000 mg | ORAL_TABLET | Freq: Every day | ORAL | 0 refills | Status: DC | PRN
  Filled 2023-01-24: qty 150, 30d supply, fill #0

## 2023-01-25 ENCOUNTER — Other Ambulatory Visit: Payer: Self-pay

## 2023-01-25 DIAGNOSIS — L400 Psoriasis vulgaris: Secondary | ICD-10-CM | POA: Diagnosis not present

## 2023-01-25 MED ORDER — FLUOCINONIDE 0.05 % EX CREA
TOPICAL_CREAM | Freq: Two times a day (BID) | CUTANEOUS | 2 refills | Status: DC
  Filled 2023-01-25: qty 60, 30d supply, fill #0
  Filled 2023-04-28: qty 60, 30d supply, fill #1
  Filled 2023-06-15: qty 60, 30d supply, fill #2
  Filled 2023-07-14: qty 60, 30d supply, fill #3

## 2023-01-26 ENCOUNTER — Other Ambulatory Visit: Payer: Self-pay

## 2023-01-26 DIAGNOSIS — Z79899 Other long term (current) drug therapy: Secondary | ICD-10-CM | POA: Diagnosis not present

## 2023-01-30 ENCOUNTER — Other Ambulatory Visit: Payer: Self-pay

## 2023-02-06 DIAGNOSIS — Z940 Kidney transplant status: Secondary | ICD-10-CM | POA: Diagnosis not present

## 2023-02-08 ENCOUNTER — Ambulatory Visit: Payer: 59 | Admitting: Family

## 2023-02-15 ENCOUNTER — Ambulatory Visit (INDEPENDENT_AMBULATORY_CARE_PROVIDER_SITE_OTHER): Payer: 59 | Admitting: Family

## 2023-02-15 ENCOUNTER — Encounter: Payer: Self-pay | Admitting: Family

## 2023-02-15 DIAGNOSIS — Z89512 Acquired absence of left leg below knee: Secondary | ICD-10-CM

## 2023-02-15 DIAGNOSIS — Z89511 Acquired absence of right leg below knee: Secondary | ICD-10-CM

## 2023-02-15 NOTE — Progress Notes (Signed)
Office Visit Note   Patient: [name]           Date of Birth: 1976/10/10           MRN: [id number] Visit Date: 02/15/2023              Requested by: Janith Lima, MD [address] [place],  [place] [address] PCP: Janith Lima, MD  Chief Complaint  Patient presents with   Right Leg - Follow-up    Hx right BKA   Left Leg - Follow-up    Hx left BKA      HPI: Patient is a 47 year old gentleman seen today for breakdown of his liners for his bilateral below-knee amputation prostheses these are worn out broken down there holes and tears in his liner materials  Denies any wounds or skin breakdown Patient is an existing bilateral transtibial  amputee.  Patient's current comorbidities are not expected to impact the ability to function with the prescribed prosthesis. Patient verbally communicates a strong desire to use a prosthesis. Patient currently requires mobility aids to ambulate without a prosthesis.  Expects not to use mobility aids with a new prosthesis.  Patient is a K3 level ambulator that spends a lot of time walking around on uneven terrain over obstacles, up and down stairs, and ambulates with a variable cadence.    Assessment & Plan: Visit Diagnoses: No diagnosis found.  Plan: Given an order for new prosthesis supplies and liners.  Follow-up as needed  Follow-Up Instructions: No follow-ups on file.   Ortho Exam  Patient is alert, oriented, no adenopathy, well-dressed, normal affect, normal respiratory effort on examination bilateral below-knee amputation the limbs are well consolidated well-healed there is no callus no ulcer no impending ulceration  Imaging: No results found. No images are attached to the encounter.  Labs: Lab Results  Component Value Date   HGBA1C 5.7 06/16/2021   HGBA1C 5.4 10/26/2017   HGBA1C 5.4 12/31/2016   CRP 0.7 01/20/2020   CRP 0.7 01/19/2020   CRP 1.1 (H) 01/18/2020   LABURIC 5.6 10/23/2020   REPTSTATUS  05/17/2017 FINAL 05/14/2017   GRAMSTAIN  05/14/2017    FEW WBC PRESENT, PREDOMINANTLY PMN RARE GRAM POSITIVE COCCI IN PAIRS    CULT NORMAL SKIN FLORA 05/14/2017     Lab Results  Component Value Date   ALBUMIN 3.4 (L) 06/19/2020   ALBUMIN 4.1 02/26/2020   ALBUMIN 3.9 01/16/2020    Lab Results  Component Value Date   MG 2.1 01/20/2020   MG 1.7 01/19/2020   MG 1.7 01/18/2020   No results found for: "VD25OH"  No results found for: "PREALBUMIN"    Latest Ref Rng & Units 09/26/2021   12:00 AM 06/16/2021   12:00 AM 06/19/2020    3:12 PM  CBC EXTENDED  WBC  7.5     7.6     7.4   RBC 4.22 - 5.81 MIL/uL   5.02   Hemoglobin 13.5 - 17.5 15.6     14.9     15.3   HCT 41 - 53 45     47     48.5   Platelets 150 - 399 263     234     269      This result is from an external source.     There is no height or weight on file to calculate BMI.  Orders:  No orders of the defined types were placed in this encounter.  No orders of the defined types were placed in this encounter.    Procedures: No procedures performed  Clinical Data: No additional findings.  ROS:  All other systems negative, except as noted in the HPI. Review of Systems  Objective: Vital Signs: There were no vitals taken for this visit.  Specialty Comments:  No specialty comments available.  PMFS History: Patient Active Problem List   Diagnosis Date Noted   Screen for colon cancer 01/10/2022   Balanitis 01/10/2022   Encounter for general adult medical examination with abnormal findings 01/10/2022   Screening for prostate cancer 01/10/2022   Abnormal EKG 10/26/2017   Hypertension 08/22/2017   Hypertensive urgency 12/29/2016   Healthcare maintenance 08/26/2016   Impaired mobility and activities of daily living 07/03/2014   Renal transplant, status post 07/19/2013   History of pancreas transplant ([place]) 07/19/2013   Immunosuppressed status ([place]) 06/27/2013   H/O pancreas transplant ([place]) 06/27/2013    Diabetic gastroparesis associated with type 1 diabetes mellitus ([place]) 05/08/2013   S/P bilateral BKA (below knee amputation) ([place]) 123456   Metabolic bone disease 99991111   ESRD (end stage renal disease) ([place]) 11/17/2009   GERD 03/14/2007   Dyslipidemia, goal LDL below 100 02/22/2007   Major depressive disorder, recurrent episode ([place]) 02/22/2007   POST TRAUMATIC STRESS DISORDER 02/22/2007   IMPOTENCE, 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   Diabetes mellitus without complication [facility])    no since pancreas transplant   Dialysis patient [facility]) 04/18/12   "[facility]; [place], [place], Sat"   Edema    ESRD (end stage renal disease) on dialysis ([place])    Gastroparesis    Gastropathy    GERD (gastroesophageal reflux disease)    Headache(784.0)    Hypertension    MRSA infection    over 10 years ago per patient. in legs    Family History  Problem Relation Age of Onset   Diabetes Other    Hypertension Other    Hypertension Mother    Diabetes Mother    Kidney disease Mother    Diabetes Maternal Grandmother    Diabetes Paternal Grandmother    Colon cancer Neg Hx    Esophageal cancer Neg Hx    Rectal cancer Neg Hx    Stomach cancer Neg Hx     Past Surgical History:  Procedure Laterality Date   AV FISTULA PLACEMENT  08/2011   left upper arm   BELOW KNEE LEG AMPUTATION  "it's been awhile"   bilaterally   CATARACT EXTRACTION  ~ 2011   right   COMBINED KIDNEY-PANCREAS TRANSPLANT  2014   ESOPHAGOGASTRODUODENOSCOPY N/A 12/30/2016   Procedure: ESOPHAGOGASTRODUODENOSCOPY (EGD);  Surgeon: Carol Ada, MD;  Location: [facility] ENDOSCOPY;  Service: Endoscopy;  Laterality: N/A;   OLECRANON BURSECTOMY Right 06/23/2020   Procedure: RIGHT ELBOW  EXCISION OLECRANON BURSITIS;  Surgeon: Newt Minion, MD;  Location: [place];  Service: Orthopedics;  Laterality: Right;   Social History   Occupational History   Occupation: Disability  Tobacco Use   Smoking status: Former    Types: Cigars    Quit date: 07/06/2022    Years since quitting: 0.6   Smokeless  tobacco: Never   Tobacco comments:    black and mild  Substance and Sexual Activity   Alcohol use: No   Drug use: Yes    Frequency: 3.0 times per week    Types: Marijuana    Comment: Occasionally   Sexual activity: Yes    Partners: Female

## 2023-02-23 ENCOUNTER — Other Ambulatory Visit: Payer: Self-pay

## 2023-02-23 DIAGNOSIS — G894 Chronic pain syndrome: Secondary | ICD-10-CM | POA: Diagnosis not present

## 2023-02-23 DIAGNOSIS — G546 Phantom limb syndrome with pain: Secondary | ICD-10-CM | POA: Diagnosis not present

## 2023-02-23 DIAGNOSIS — R030 Elevated blood-pressure reading, without diagnosis of hypertension: Secondary | ICD-10-CM | POA: Diagnosis not present

## 2023-02-23 DIAGNOSIS — Z9181 History of falling: Secondary | ICD-10-CM | POA: Diagnosis not present

## 2023-02-23 DIAGNOSIS — Z79899 Other long term (current) drug therapy: Secondary | ICD-10-CM | POA: Diagnosis not present

## 2023-02-23 MED ORDER — OXYCODONE HCL 15 MG PO TABS
15.0000 mg | ORAL_TABLET | Freq: Every day | ORAL | 0 refills | Status: DC | PRN
  Filled 2023-02-23: qty 150, 30d supply, fill #0

## 2023-03-02 ENCOUNTER — Telehealth: Payer: Self-pay

## 2023-03-02 NOTE — Telephone Encounter (Signed)
[facility] has requested rx for bilat BKA prosthetic and supplies

## 2023-03-14 DIAGNOSIS — Z940 Kidney transplant status: Secondary | ICD-10-CM | POA: Diagnosis not present

## 2023-03-23 ENCOUNTER — Other Ambulatory Visit: Payer: Self-pay

## 2023-03-23 DIAGNOSIS — G894 Chronic pain syndrome: Secondary | ICD-10-CM | POA: Diagnosis not present

## 2023-03-23 DIAGNOSIS — R030 Elevated blood-pressure reading, without diagnosis of hypertension: Secondary | ICD-10-CM | POA: Diagnosis not present

## 2023-03-23 DIAGNOSIS — Z9181 History of falling: Secondary | ICD-10-CM | POA: Diagnosis not present

## 2023-03-23 DIAGNOSIS — E559 Vitamin D deficiency, unspecified: Secondary | ICD-10-CM | POA: Diagnosis not present

## 2023-03-23 DIAGNOSIS — G546 Phantom limb syndrome with pain: Secondary | ICD-10-CM | POA: Diagnosis not present

## 2023-03-23 DIAGNOSIS — Z79899 Other long term (current) drug therapy: Secondary | ICD-10-CM | POA: Diagnosis not present

## 2023-03-23 MED ORDER — OXYCODONE HCL 15 MG PO TABS
15.0000 mg | ORAL_TABLET | Freq: Every day | ORAL | 0 refills | Status: DC
  Filled 2023-03-23 – 2023-03-24 (×2): qty 150, 30d supply, fill #0

## 2023-03-24 ENCOUNTER — Other Ambulatory Visit: Payer: Self-pay

## 2023-04-13 DIAGNOSIS — Z89511 Acquired absence of right leg below knee: Secondary | ICD-10-CM | POA: Diagnosis not present

## 2023-04-13 DIAGNOSIS — Z89512 Acquired absence of left leg below knee: Secondary | ICD-10-CM | POA: Diagnosis not present

## 2023-04-14 DIAGNOSIS — Z940 Kidney transplant status: Secondary | ICD-10-CM | POA: Diagnosis not present

## 2023-04-20 ENCOUNTER — Other Ambulatory Visit: Payer: Self-pay

## 2023-04-20 DIAGNOSIS — Z79899 Other long term (current) drug therapy: Secondary | ICD-10-CM | POA: Diagnosis not present

## 2023-04-20 DIAGNOSIS — E559 Vitamin D deficiency, unspecified: Secondary | ICD-10-CM | POA: Diagnosis not present

## 2023-04-20 DIAGNOSIS — G894 Chronic pain syndrome: Secondary | ICD-10-CM | POA: Diagnosis not present

## 2023-04-20 DIAGNOSIS — Z9181 History of falling: Secondary | ICD-10-CM | POA: Diagnosis not present

## 2023-04-20 DIAGNOSIS — R030 Elevated blood-pressure reading, without diagnosis of hypertension: Secondary | ICD-10-CM | POA: Diagnosis not present

## 2023-04-20 DIAGNOSIS — G546 Phantom limb syndrome with pain: Secondary | ICD-10-CM | POA: Diagnosis not present

## 2023-04-20 MED ORDER — ERGOCALCIFEROL 1.25 MG (50000 UT) PO CAPS
1.0000 | ORAL_CAPSULE | ORAL | 3 refills | Status: DC
  Filled 2023-04-20: qty 4, 28d supply, fill #0

## 2023-04-20 MED ORDER — OXYCODONE HCL 15 MG PO TABS
15.0000 mg | ORAL_TABLET | Freq: Every day | ORAL | 0 refills | Status: DC | PRN
  Filled 2023-04-24: qty 150, 30d supply, fill #0

## 2023-04-24 ENCOUNTER — Other Ambulatory Visit: Payer: Self-pay

## 2023-04-28 ENCOUNTER — Other Ambulatory Visit: Payer: Self-pay

## 2023-05-01 ENCOUNTER — Other Ambulatory Visit: Payer: Self-pay

## 2023-05-17 DIAGNOSIS — Z940 Kidney transplant status: Secondary | ICD-10-CM | POA: Diagnosis not present

## 2023-05-18 DIAGNOSIS — D751 Secondary polycythemia: Secondary | ICD-10-CM | POA: Diagnosis not present

## 2023-05-18 DIAGNOSIS — N183 Chronic kidney disease, stage 3 unspecified: Secondary | ICD-10-CM | POA: Diagnosis not present

## 2023-05-18 DIAGNOSIS — E7800 Pure hypercholesterolemia, unspecified: Secondary | ICD-10-CM | POA: Diagnosis not present

## 2023-05-18 DIAGNOSIS — R21 Rash and other nonspecific skin eruption: Secondary | ICD-10-CM | POA: Diagnosis not present

## 2023-05-18 DIAGNOSIS — Z9483 Pancreas transplant status: Secondary | ICD-10-CM | POA: Diagnosis not present

## 2023-05-18 DIAGNOSIS — I129 Hypertensive chronic kidney disease with stage 1 through stage 4 chronic kidney disease, or unspecified chronic kidney disease: Secondary | ICD-10-CM | POA: Diagnosis not present

## 2023-05-18 DIAGNOSIS — Z79899 Other long term (current) drug therapy: Secondary | ICD-10-CM | POA: Diagnosis not present

## 2023-05-18 DIAGNOSIS — Z940 Kidney transplant status: Secondary | ICD-10-CM | POA: Diagnosis not present

## 2023-05-18 DIAGNOSIS — R799 Abnormal finding of blood chemistry, unspecified: Secondary | ICD-10-CM | POA: Diagnosis not present

## 2023-05-18 LAB — HEMOGLOBIN A1C: Hemoglobin A1C: 5.9

## 2023-05-19 ENCOUNTER — Other Ambulatory Visit: Payer: Self-pay

## 2023-05-19 DIAGNOSIS — G546 Phantom limb syndrome with pain: Secondary | ICD-10-CM | POA: Diagnosis not present

## 2023-05-19 DIAGNOSIS — Z9181 History of falling: Secondary | ICD-10-CM | POA: Diagnosis not present

## 2023-05-19 DIAGNOSIS — R030 Elevated blood-pressure reading, without diagnosis of hypertension: Secondary | ICD-10-CM | POA: Diagnosis not present

## 2023-05-19 DIAGNOSIS — Z79899 Other long term (current) drug therapy: Secondary | ICD-10-CM | POA: Diagnosis not present

## 2023-05-19 DIAGNOSIS — E559 Vitamin D deficiency, unspecified: Secondary | ICD-10-CM | POA: Diagnosis not present

## 2023-05-19 DIAGNOSIS — G894 Chronic pain syndrome: Secondary | ICD-10-CM | POA: Diagnosis not present

## 2023-05-19 MED ORDER — ERGOCALCIFEROL 1.25 MG (50000 UT) PO CAPS
1.0000 | ORAL_CAPSULE | ORAL | 3 refills | Status: DC
  Filled 2023-05-19: qty 4, 28d supply, fill #0

## 2023-05-19 MED ORDER — OXYCODONE HCL 15 MG PO TABS
15.0000 mg | ORAL_TABLET | Freq: Every day | ORAL | 0 refills | Status: DC | PRN
  Filled 2023-05-19: qty 145, 31d supply, fill #0
  Filled 2023-05-23: qty 145, 30d supply, fill #0

## 2023-05-23 ENCOUNTER — Other Ambulatory Visit: Payer: Self-pay

## 2023-05-26 ENCOUNTER — Other Ambulatory Visit: Payer: Self-pay

## 2023-06-02 ENCOUNTER — Encounter: Payer: Self-pay | Admitting: Family Medicine

## 2023-06-02 ENCOUNTER — Ambulatory Visit (INDEPENDENT_AMBULATORY_CARE_PROVIDER_SITE_OTHER): Payer: 59

## 2023-06-02 ENCOUNTER — Encounter: Payer: Self-pay | Admitting: Nurse Practitioner

## 2023-06-02 ENCOUNTER — Ambulatory Visit (INDEPENDENT_AMBULATORY_CARE_PROVIDER_SITE_OTHER): Payer: 59 | Admitting: Family Medicine

## 2023-06-02 VITALS — BP 112/58 | HR 85 | Temp 98.9°F | Wt 191.8 lb

## 2023-06-02 DIAGNOSIS — Z9483 Pancreas transplant status: Secondary | ICD-10-CM | POA: Diagnosis not present

## 2023-06-02 DIAGNOSIS — Z89511 Acquired absence of right leg below knee: Secondary | ICD-10-CM | POA: Diagnosis not present

## 2023-06-02 DIAGNOSIS — R051 Acute cough: Secondary | ICD-10-CM

## 2023-06-02 DIAGNOSIS — R1310 Dysphagia, unspecified: Secondary | ICD-10-CM

## 2023-06-02 DIAGNOSIS — H6123 Impacted cerumen, bilateral: Secondary | ICD-10-CM

## 2023-06-02 DIAGNOSIS — J029 Acute pharyngitis, unspecified: Secondary | ICD-10-CM | POA: Diagnosis not present

## 2023-06-02 DIAGNOSIS — Z89512 Acquired absence of left leg below knee: Secondary | ICD-10-CM | POA: Diagnosis not present

## 2023-06-02 DIAGNOSIS — F17200 Nicotine dependence, unspecified, uncomplicated: Secondary | ICD-10-CM

## 2023-06-02 DIAGNOSIS — Z940 Kidney transplant status: Secondary | ICD-10-CM | POA: Diagnosis not present

## 2023-06-02 LAB — CBC WITH DIFFERENTIAL/PLATELET
Basophils Absolute: 0.1 10*3/uL (ref 0.0–0.1)
Basophils Relative: 1.2 % (ref 0.0–3.0)
Eosinophils Absolute: 0.2 10*3/uL (ref 0.0–0.7)
Eosinophils Relative: 3.4 % (ref 0.0–5.0)
HCT: 53.4 % — ABNORMAL HIGH (ref 39.0–52.0)
Hemoglobin: 17.1 g/dL — ABNORMAL HIGH (ref 13.0–17.0)
Lymphocytes Relative: 15.7 % (ref 12.0–46.0)
Lymphs Abs: 0.7 10*3/uL (ref 0.7–4.0)
MCHC: 32.1 g/dL (ref 30.0–36.0)
MCV: 97.9 fl (ref 78.0–100.0)
Monocytes Absolute: 0.9 10*3/uL (ref 0.1–1.0)
Monocytes Relative: 20.9 % — ABNORMAL HIGH (ref 3.0–12.0)
Neutro Abs: 2.7 10*3/uL (ref 1.4–7.7)
Neutrophils Relative %: 58.8 % (ref 43.0–77.0)
Platelets: 222 10*3/uL (ref 150.0–400.0)
RBC: 5.45 Mil/uL (ref 4.22–5.81)
RDW: 15.3 % (ref 11.5–15.5)
WBC: 4.5 10*3/uL (ref 4.0–10.5)

## 2023-06-02 LAB — COMPREHENSIVE METABOLIC PANEL
ALT: 4 U/L (ref 0–53)
AST: 14 U/L (ref 0–37)
Albumin: 3.8 g/dL (ref 3.5–5.2)
Alkaline Phosphatase: 93 U/L (ref 39–117)
BUN: 15 mg/dL (ref 6–23)
CO2: 21 mEq/L (ref 19–32)
Calcium: 9.2 mg/dL (ref 8.4–10.5)
Chloride: 107 mEq/L (ref 96–112)
Creatinine, Ser: 1.68 mg/dL — ABNORMAL HIGH (ref 0.40–1.50)
GFR: 47.77 mL/min — ABNORMAL LOW (ref >60.00)
Glucose, Bld: 93 mg/dL (ref 70–99)
Potassium: 4.1 mEq/L (ref 3.5–5.1)
Sodium: 138 mEq/L (ref 135–145)
Total Bilirubin: 0.4 mg/dL (ref 0.2–1.2)
Total Protein: 7.1 g/dL (ref 6.0–8.3)

## 2023-06-02 LAB — POCT RAPID STREP A (OFFICE): Rapid Strep A Screen: NEGATIVE

## 2023-06-02 NOTE — Patient Instructions (Addendum)
Strep test was negative.  We will get a chest xray and labs just to make sure there is no underlying process causing your symptoms.     Continue to work on stopping smoking. How to Kick the Smoking Habit   Why should I quit smoking?  Quitting smoking is the most important thing you can do for your health. Smoking can cause cancer, lung disease, heart disease, and many other health problems. Secondhand smoke can be dangerous too. It can cause lung cancer and heart disease in adults. It can make asthma worse or cause ear infections in kids.   You'll see benefits as soon as you quit smoking. Your heart rate and blood pressure will go down. You'll breathe easier. It will be easier to exercise. Your sense of smell and taste will be better. You'll lower your risk of cancer, lung disease, and heart disease. You'll even live longer!   Why is it so hard to quit smoking?  Nicotine is a strong drug. Your body becomes addicted to nicotine when you smoke. You may have withdrawal symptoms or cravings when you stop smoking. You may become anxious or irritable. You might have trouble sleeping or want to eat more. These symptoms are usually worst the first week after quitting. The good news is nicotine withdrawal symptoms only last a few weeks for most people.  The routines and habits that go along with smoking can make it tough to quit too. Some people often smoke a cigarette when they drive, after a meal, or when they're on the phone. Smoking can become a part of these routines. After you quit smoking these habits can be a trigger to make you want to smoke again. It's important to separate smoking from these routines when you quit.  How can I make it easier to quit?  You don't have to quit "cold [place]." You can double or triple the chance that you'll stop smoking if you use a medicine and counseling together. There are many medicines available. These medicines work in different ways to help manage nicotine  withdrawal. Many can be bought off the shelves at your local pharmacy. Some require a prescription. Talk to your pharmacist or prescriber about what medicines may be right for you.  It is very important to have counseling when you quit. Medicines can help you cope with nicotine withdrawal. Counseling can help you develop skills to break smoking habits. There are lots of counseling options available. Many of these are free. Some options are local support groups, telephone quitlines, online services, and texting programs.   Start thinking now about how you plan to quit. Think about why you want to quit. Look at triggers that make you want to smoke. Plan for challenges you might face when trying to quit. Talk to your pharmacist about how to get help.   Where can I learn more?  Toll-free Quitlines and Websites:  In the [place] 1-800-QUIT-NOW(1-847-747-4578); http://smokefree.gov    These websites include online support, live chat, and text messaging programs.

## 2023-06-02 NOTE — Progress Notes (Signed)
Established Patient Office Visit   Subjective  Patient ID: [name], male    DOB: Mar 21, 1976  Age: 47 y.o. MRN: [id number]  Chief Complaint  Patient presents with   Cough    Cough started 2-3 days ago, got really bad yesterday. Is worse at night and when lying down. Not keeping anything down due to cough. Had some halls last night, and delsym, some delsym came back up after takin   Pt accompanied by family member.  Patient is a 47 year old male followed by Dr. Yetta Barre and seen for acute concern.  Patient endorses a cough x 3-4 days.  At times unable to keep food down due to cough.  Coughing up foamy phlegm.  Sore throat started today.  Denies fever, chills, rhinorrhea, ear pain/ear pressure, facial pain/facial pressure, sick contacts.  Had an episode of loose stools last night.  Patient also notes dysphagia x 1 month.  In the past had esophageal stricture which required dilation.  Concerned it may be returning as having difficulty swallowing solids and liquids.  Taking omeprazole for history of heartburn.  Had an episode of heartburn a few nights ago after eating spaghetti late at night.  Pt smoking 1 to 2 packs black and milds daily.  Started smoking Black and milds several years ago to stop smoking MJ.  Patient has not smoked in the last 2 days since being sick.    Cough    Past Medical History:  Diagnosis Date   AMPUTATION, BELOW KNEE, 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   Diabetes mellitus without complication [facility])    no since pancreas transplant   Dialysis patient [facility]) 04/18/12   "[facility]; [place], [place], Sat"   Edema    ESRD (end stage renal disease) on dialysis (HCC)    Gastroparesis    Gastropathy    GERD (gastroesophageal reflux disease)    Headache(784.0)    Hypertension    MRSA  infection    over 10 years ago per patient. in legs   Past Surgical History:  Procedure Laterality Date   AV FISTULA PLACEMENT  08/2011   left upper arm   BELOW KNEE LEG AMPUTATION  "it's been awhile"   bilaterally   CATARACT EXTRACTION  ~ 2011   right   COMBINED KIDNEY-PANCREAS TRANSPLANT  2014   ESOPHAGOGASTRODUODENOSCOPY N/A 12/30/2016   Procedure: ESOPHAGOGASTRODUODENOSCOPY (EGD);  Surgeon: Jeani Hawking, MD;  Location: [facility] ENDOSCOPY;  Service: Endoscopy;  Laterality: N/A;   OLECRANON BURSECTOMY Right 06/23/2020   Procedure: RIGHT ELBOW EXCISION OLECRANON BURSITIS;  Surgeon: Nadara Mustard, MD;  Location: [facility];  Service: Orthopedics;  Laterality: Right;   Social History   Tobacco Use   Smoking status: Former    Types: Cigars    Quit date: 07/06/2022    Years since quitting: 0.9   Smokeless tobacco: Never   Tobacco comments:    black and mild  Substance Use Topics   Alcohol use: No   Drug use: Yes    Frequency: 3.0 times per week    Types: Marijuana    Comment: Occasionally   Family History  Problem Relation Age of Onset   Diabetes Other    Hypertension Other    Hypertension Mother  Diabetes Mother    Kidney disease Mother    Diabetes Maternal Grandmother    Diabetes Paternal Grandmother    Colon cancer Neg Hx    Esophageal cancer Neg Hx    Rectal cancer Neg Hx    Stomach cancer Neg Hx    Allergies  Allergen Reactions   Lisinopril     Cough   Amlodipine Rash      Review of Systems  Respiratory:  Positive for cough.    Negative unless stated above    Objective:     BP (!) 112/58 (BP Location: Right Arm, Patient Position: Sitting, Cuff Size: Large)   Pulse 85   Temp 98.9 F (37.2 C) (Oral)   Wt 191 lb 12.8 oz (87 kg)   SpO2 98%   BMI 26.75 kg/m    Physical Exam Constitutional:      General: He is not in acute distress.    Appearance: Normal appearance.  HENT:     Head: Normocephalic and atraumatic.     Nose: Nose  normal.     Mouth/Throat:     Mouth: Mucous membranes are moist.  Cardiovascular:     Rate and Rhythm: Normal rate and regular rhythm.     Heart sounds: Normal heart sounds. No murmur heard.    No gallop.  Pulmonary:     Effort: Pulmonary effort is normal. No respiratory distress.     Breath sounds: Normal breath sounds. No wheezing, rhonchi or rales.  Musculoskeletal:     Comments: Bilateral BKAs  Skin:    General: Skin is warm and dry.  Neurological:     Mental Status: He is alert and oriented to person, place, and time.     Results for orders placed or performed in visit on 06/02/23  POC Rapid Strep A  Result Value Ref Range   Rapid Strep A Screen Negative Negative      Assessment & Plan:  Acute cough -     DG Chest 2 View -     CBC with Differential/Platelet -     Comprehensive metabolic panel  Bilateral impacted cerumen  Dysphagia, unspecified type -     Comprehensive metabolic panel  S/P bilateral BKA (below knee amputation) (HCC)  Renal transplant, status post -     Comprehensive metabolic panel  History of pancreas transplant (HCC) -     Comprehensive metabolic panel  Tobacco use disorder -     DG Chest 2 View  Sore throat -     POCT rapid strep A  Increased concern for bacterial infection given history of immunosuppression.  Increased cough could also be caused by COPD given history of tobacco use or silent reflux due to history of GERD.  Continue omeprazole.  Obtain CXR and labs.  Patient advised to set up follow-up appointment with GI given dysphagia as may require repeat esophageal dilation.  Rapid strep testing negative.  Continue supportive care.  Further recommendations based on results.  Given strict ED precautions.  Return if symptoms worsen or fail to improve.   Deeann Saint, MD

## 2023-06-14 ENCOUNTER — Other Ambulatory Visit: Payer: Self-pay

## 2023-06-14 DIAGNOSIS — L400 Psoriasis vulgaris: Secondary | ICD-10-CM | POA: Diagnosis not present

## 2023-06-14 MED ORDER — FLUOCINONIDE 0.05 % EX CREA
TOPICAL_CREAM | Freq: Two times a day (BID) | CUTANEOUS | 2 refills | Status: DC
  Filled 2023-06-14: qty 120, 60d supply, fill #0
  Filled 2023-06-14: qty 60, 30d supply, fill #0

## 2023-06-15 ENCOUNTER — Other Ambulatory Visit: Payer: Self-pay

## 2023-06-15 ENCOUNTER — Encounter: Payer: Self-pay | Admitting: Nurse Practitioner

## 2023-06-15 ENCOUNTER — Ambulatory Visit (INDEPENDENT_AMBULATORY_CARE_PROVIDER_SITE_OTHER): Payer: 59 | Admitting: Nurse Practitioner

## 2023-06-15 VITALS — BP 126/78 | HR 75 | Ht 70.0 in | Wt 193.1 lb

## 2023-06-15 DIAGNOSIS — R634 Abnormal weight loss: Secondary | ICD-10-CM | POA: Diagnosis not present

## 2023-06-15 DIAGNOSIS — Z940 Kidney transplant status: Secondary | ICD-10-CM | POA: Diagnosis not present

## 2023-06-15 DIAGNOSIS — R1310 Dysphagia, unspecified: Secondary | ICD-10-CM | POA: Diagnosis not present

## 2023-06-15 NOTE — Patient Instructions (Signed)
_______________________________________________________  If your blood pressure at your visit was 140/90 or greater, please contact your primary care physician to follow up on this.  If you are age 49 or younger, your body mass index should be between 19-25. Your Body mass index is 27.71 kg/m. If this is out of the aformentioned range listed, please consider follow up with your Primary Care Provider.  ________________________________________________________  The [facility] providers would like to encourage you to use [facility] to communicate with providers for non-urgent requests or questions.  Due to long hold times on the telephone, sending your provider a message by [facility] may be a faster and more efficient way to get a response.  Please allow 48 business hours for a response.  Please remember that this is for non-urgent requests.  _______________________________________________________  Bonita Quin have been scheduled for an endoscopy. Please follow written instructions given to you at your visit today. If you use inhalers (even only as needed), please bring them with you on the day of your procedure.   Due to recent changes in healthcare laws, you may see the results of your imaging and laboratory studies on MyChart before your provider has had a chance to review them.  We understand that in some cases there may be results that are confusing or concerning to you. Not all laboratory results come back in the same time frame and the provider may be waiting for multiple results in order to interpret others.  Please give [place] 48 hours in order for your provider to thoroughly review all the results before contacting the office for clarification of your results.   Thank you for entrusting me with your care and choosing [facility].  Gunnar Fusi, NP

## 2023-06-15 NOTE — Progress Notes (Signed)
Assessment / Plan   Primary GI: Stan Head, MD  47 year old male with history of renal and pancreas transplant.  Patient on chronic immunosuppressive therapy. He presents with 4 to 5 months worth of dysphagia to both solids and liquids with associated weight loss.   Increased risk for infectious esophagitis . Doesn't have typical odynophagia, just pain when food gets stuck.  No obvious oral Candida on exam.  We evaluated him in 2018 for dysphagia and his esophagus was normal on EGD.  -Thought about empirically treating with Diflucan but instead  will expedite an upper endoscopy for him. The risks and benefits of EGD with possible biopsies were discussed with the patient who agrees to proceed.   Bilateral BKA.  He ambulates independently with prosthetics   History of Present Illness   Chief Complaint: Food and fluid getting stuck in esophagus   47 y.o. yo male with a past medical history consisting of, but not necessarily limited to diabetes,  s/p renal and pancreas transplant, gastroparesis, bilateral BKA, hypertension, GERD  [name] was last seen by [place] in the hospital January 2021 for nausea and coffee-ground emesis.  We held off on EGD as he was COVID-positive and his GI symptoms had improved. [name] comes in with his wife today for evaluation of dysphagia.  His symptoms started 4 to 5 months ago.  He is having a difficult time swallowing both solids and liquids.  Everything seems to stop in his esophagus and then he has pain . Additionally he regurgitates food at night. He has a history of acid reflux but feels that overall his heartburn is controlled with daily omeprazole.  Recently developed a cough, chest x-ray was negative for aspiration. He has had significant weight loss over the last few months going from 212 pounds to 193 due to being unable to swallow.        Latest Ref Rng & Units 06/02/2023    2:56 PM 09/26/2021   12:00 AM 06/16/2021   12:00 AM  CBC  WBC 4.0 - 10.5  K/uL 4.5  7.5     7.6      Hemoglobin 13.0 - 17.0 g/dL 25.9  56.3     87.5      Hematocrit 39.0 - 52.0 % 53.4  45     47      Platelets 150.0 - 400.0 K/uL 222.0  263     234         This result is from an external source.    DG Chest 2 View CLINICAL DATA:  Cough for 3-4 days, on immunosuppression status post renal and pancreatic transplant  EXAM: CHEST - 2 VIEW  COMPARISON:  Chest x-ray February 26, 2020  FINDINGS: The cardiomediastinal silhouette is unchanged in contour. No focal pulmonary opacity. No pleural effusion or pneumothorax. The visualized upper abdomen is unremarkable. No acute osseous abnormality.  IMPRESSION: No active cardiopulmonary disease.  Electronically Signed   By: Jacob Moores M.D.   On: 06/09/2023 16:56    Past Medical History:  Diagnosis Date   AMPUTATION, BELOW KNEE, 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   Diabetes mellitus without complication (HCC)    no since pancreas transplant   Dialysis patient [facility]) 04/18/12   "[place]  [facility]; [place], [place], Sat"   Edema    ESRD (end stage renal disease) on dialysis (HCC)    Gastroparesis    Gastropathy    GERD (gastroesophageal reflux disease)    Headache(784.0)    Hypertension    MRSA infection    over 10 years ago per patient. in legs   Past Surgical History:  Procedure Laterality Date   AV FISTULA PLACEMENT  08/2011   left upper arm   BELOW KNEE LEG AMPUTATION  "it's been awhile"   bilaterally   CATARACT EXTRACTION  ~ 2011   right   COMBINED KIDNEY-PANCREAS TRANSPLANT  2014   ESOPHAGOGASTRODUODENOSCOPY N/A 12/30/2016   Procedure: ESOPHAGOGASTRODUODENOSCOPY (EGD);  Surgeon: Jeani Hawking, MD;  Location: [facility] ENDOSCOPY;  Service: Endoscopy;  Laterality: N/A;   OLECRANON BURSECTOMY Right 06/23/2020   Procedure: RIGHT ELBOW EXCISION  OLECRANON BURSITIS;  Surgeon: Nadara Mustard, MD;  Location:  SURGERY CENTER;  Service: Orthopedics;  Laterality: Right;   Family History  Problem Relation Age of Onset   Diabetes Other    Hypertension Other    Hypertension Mother    Diabetes Mother    Kidney disease Mother    Diabetes Maternal Grandmother    Diabetes Paternal Grandmother    Colon cancer Neg Hx    Esophageal cancer Neg Hx    Rectal cancer Neg Hx    Stomach cancer Neg Hx    Social History   Tobacco Use   Smoking status: Former    Types: Cigars    Quit date: 07/06/2022    Years since quitting: 0.9   Smokeless tobacco: Never   Tobacco comments:    black and mild  Substance Use Topics   Alcohol use: No   Drug use: Yes    Frequency: 3.0 times per week    Types: Marijuana    Comment: Occasionally   Current Outpatient Medications  Medication Sig Dispense Refill   aspirin EC 81 MG tablet Take 81 mg by mouth daily.     BELATACEPT IV Inject into the vein.     ergocalciferol (VITAMIN D2) 1.25 MG (50000 UT) capsule Take 1 capsule (50,000 Units total) by mouth once a week. 4 capsule 3   fluocinonide cream (LIDEX) 0.05 % Apply one application to affected areas twice daily 120 g 2   hydrALAZINE (APRESOLINE) 50 MG tablet Take 1 tablet (50 mg total) by mouth 3 (three) times daily. (Patient taking differently: Take 75 mg by mouth 2 (two) times daily.) 30 tablet 0   labetalol (NORMODYNE) 200 MG tablet TK 1 T PO BID (Patient not taking: Reported on 06/02/2023)  6   metoCLOPramide (REGLAN) 10 MG tablet Take 1 tablet (10 mg total) by mouth 4 (four) times daily. Take 1tab 20-30 minutes before breakfast, lunch, dinner and at bedtime 120 tablet 1   mycophenolate (MYFORTIC) 180 MG EC tablet Take 360 mg by mouth 2 (two) times daily.     omeprazole (PRILOSEC) 40 MG capsule Take 1 capsule (40 mg total) by mouth 2 (two) times daily before a meal. 60 capsule 0   ondansetron (ZOFRAN ODT) 4 MG disintegrating tablet Take 1 tablet (4  mg total) by mouth every 8 (eight) hours as needed for nausea or vomiting. 20 tablet 0   oxyCODONE (ROXICODONE) 15 MG immediate release tablet Take 1 (one) Tablet 5 times daily as needed 150 tablet 0   oxyCODONE (ROXICODONE) 15 MG immediate release tablet Take 1 tablet (15 mg total) by mouth 5 (  five) times daily as needed. 150 tablet 0   oxyCODONE (ROXICODONE) 15 MG immediate release tablet Take 1 tablet (15 mg total) by mouth 5 (five) times daily as needed 150 tablet 0   oxyCODONE (ROXICODONE) 15 MG immediate release tablet Take 1 tablet (15 mg total) by mouth 5 (five) times daily as needed. 150 tablet 0   oxyCODONE (ROXICODONE) 15 MG immediate release tablet Take 1 tablet (15 mg total) by mouth 5 (five) times daily as needed. 150 tablet 0   oxyCODONE (ROXICODONE) 15 MG immediate release tablet Take 1 tablet (15 mg total) by mouth 5 (five) times daily as needed. 145 tablet 0   OxyCODONE HCl 15 MG TABA Take 15 mg by mouth 4 (four) times daily.     predniSONE (DELTASONE) 5 MG tablet Take 5 mg by mouth daily.     promethazine (PHENERGAN) 25 MG tablet Take 25 mg by mouth every 6 (six) hours as needed for nausea or vomiting.     rosuvastatin (CRESTOR) 5 MG tablet Take 1 tablet (5 mg total) by mouth daily. 90 tablet 1   sulfamethoxazole-trimethoprim (BACTRIM,SEPTRA) 400-80 MG per tablet Take 1 tablet by mouth every Monday, Wednesday, and Friday.      tacrolimus (PROGRAF) 1 MG capsule Take 2-3 mg by mouth 2 (two) times daily. 3 mg in the morning, 2 mg in the evening (Patient not taking: Reported on 06/02/2023)     No current facility-administered medications for this visit.   Allergies  Allergen Reactions   Lisinopril     Cough   Amlodipine Rash    Review of Systems: All systems reviewed and negative except where noted in HPI.   Wt Readings from Last 3 Encounters:  06/02/23 191 lb 12.8 oz (87 kg)  09/06/22 210 lb (95.3 kg)  01/10/22 221 lb (100.2 kg)    Physical Exam:  BP 126/78   Pulse 75    Ht 5\' 10"  (1.778 m)   Wt 193 lb 2 oz (87.6 kg)   SpO2 100%   BMI 27.71 kg/m  Constitutional:  Pleasant, generally well appearing male in no acute distress. Psychiatric:  Normal mood and affect. Behavior is normal. EENT: Pupils normal.  Conjunctivae are normal. No scleral icterus. No evidence for oral candida Neck supple.  Cardiovascular: Normal rate, regular rhythm.  Pulmonary/chest: Effort normal and breath sounds normal. No wheezing, rales or rhonchi. Abdominal: Soft, nondistended, nontender. Bowel sounds active throughout. There are no masses palpable. No hepatomegaly. Neurological: Alert and oriented to person place and time. Extremities: Bilateral BKA.  Has bilateral prosthesis which allows him to walk without assistance  Willette Cluster, NP  06/15/2023, 11:19 AM

## 2023-06-23 ENCOUNTER — Other Ambulatory Visit: Payer: Self-pay

## 2023-06-23 DIAGNOSIS — G546 Phantom limb syndrome with pain: Secondary | ICD-10-CM | POA: Diagnosis not present

## 2023-06-23 DIAGNOSIS — G894 Chronic pain syndrome: Secondary | ICD-10-CM | POA: Diagnosis not present

## 2023-06-23 DIAGNOSIS — Z9181 History of falling: Secondary | ICD-10-CM | POA: Diagnosis not present

## 2023-06-23 DIAGNOSIS — Z79899 Other long term (current) drug therapy: Secondary | ICD-10-CM | POA: Diagnosis not present

## 2023-06-23 DIAGNOSIS — R030 Elevated blood-pressure reading, without diagnosis of hypertension: Secondary | ICD-10-CM | POA: Diagnosis not present

## 2023-06-23 DIAGNOSIS — E559 Vitamin D deficiency, unspecified: Secondary | ICD-10-CM | POA: Diagnosis not present

## 2023-06-23 MED ORDER — OXYCODONE HCL 15 MG PO TABS
15.0000 mg | ORAL_TABLET | Freq: Every day | ORAL | 0 refills | Status: DC | PRN
  Filled 2023-06-23: qty 150, 30d supply, fill #0

## 2023-06-23 MED ORDER — VITAMIN D2 50 MCG (2000 UT) PO TABS: 2000.0000 [IU] | ORAL_TABLET | Freq: Every day | ORAL | 3 refills | Status: DC

## 2023-06-25 ENCOUNTER — Emergency Department (HOSPITAL_COMMUNITY)
Admission: EM | Admit: 2023-06-25 | Discharge: 2023-06-25 | Disposition: A | Discharge status: Home / Self Care | Payer: 59 | Attending: Emergency Medicine | Admitting: Emergency Medicine

## 2023-06-25 ENCOUNTER — Encounter (HOSPITAL_COMMUNITY): Payer: Self-pay

## 2023-06-25 ENCOUNTER — Other Ambulatory Visit: Payer: Self-pay

## 2023-06-25 ENCOUNTER — Emergency Department (HOSPITAL_COMMUNITY): Payer: 59

## 2023-06-25 ENCOUNTER — Ambulatory Visit (HOSPITAL_COMMUNITY)
Admission: EM | Admit: 2023-06-25 | Discharge: 2023-06-25 | Disposition: A | Discharge status: Home / Self Care | Payer: 59 | Attending: Emergency Medicine | Admitting: Emergency Medicine

## 2023-06-25 DIAGNOSIS — S0990XA Unspecified injury of head, initial encounter: Secondary | ICD-10-CM | POA: Insufficient documentation

## 2023-06-25 DIAGNOSIS — I120 Hypertensive chronic kidney disease with stage 5 chronic kidney disease or end stage renal disease: Secondary | ICD-10-CM | POA: Insufficient documentation

## 2023-06-25 DIAGNOSIS — Z7982 Long term (current) use of aspirin: Secondary | ICD-10-CM | POA: Insufficient documentation

## 2023-06-25 DIAGNOSIS — W228XXA Striking against or struck by other objects, initial encounter: Secondary | ICD-10-CM | POA: Insufficient documentation

## 2023-06-25 DIAGNOSIS — M533 Sacrococcygeal disorders, not elsewhere classified: Secondary | ICD-10-CM | POA: Diagnosis not present

## 2023-06-25 DIAGNOSIS — S0993XA Unspecified injury of face, initial encounter: Secondary | ICD-10-CM | POA: Diagnosis not present

## 2023-06-25 DIAGNOSIS — M549 Dorsalgia, unspecified: Secondary | ICD-10-CM | POA: Diagnosis not present

## 2023-06-25 DIAGNOSIS — E119 Type 2 diabetes mellitus without complications: Secondary | ICD-10-CM | POA: Insufficient documentation

## 2023-06-25 DIAGNOSIS — S0502XA Injury of conjunctiva and corneal abrasion without foreign body, left eye, initial encounter: Secondary | ICD-10-CM | POA: Diagnosis not present

## 2023-06-25 DIAGNOSIS — N186 End stage renal disease: Secondary | ICD-10-CM | POA: Insufficient documentation

## 2023-06-25 DIAGNOSIS — H5712 Ocular pain, left eye: Secondary | ICD-10-CM | POA: Diagnosis not present

## 2023-06-25 DIAGNOSIS — S0590XA Unspecified injury of unspecified eye and orbit, initial encounter: Secondary | ICD-10-CM | POA: Diagnosis not present

## 2023-06-25 MED ORDER — TETRACAINE HCL 0.5 % OP SOLN
1.0000 [drp] | Freq: Once | OPHTHALMIC | Status: AC
  Administered 2023-06-25: 2 [drp] via OPHTHALMIC
  Filled 2023-06-25: qty 4

## 2023-06-25 MED ORDER — HYDRALAZINE HCL 25 MG PO TABS
50.0000 mg | ORAL_TABLET | Freq: Once | ORAL | Status: AC
  Administered 2023-06-25: 50 mg via ORAL
  Filled 2023-06-25: qty 2

## 2023-06-25 MED ORDER — ERYTHROMYCIN 5 MG/GM OP OINT: TOPICAL_OINTMENT | OPHTHALMIC | 0 refills | Status: DC

## 2023-06-25 MED ORDER — OXYCODONE HCL 5 MG PO TABS
10.0000 mg | ORAL_TABLET | Freq: Once | ORAL | Status: AC
  Administered 2023-06-25: 10 mg via ORAL
  Filled 2023-06-25: qty 2

## 2023-06-25 MED ORDER — FLUORESCEIN SODIUM 1 MG OP STRP
ORAL_STRIP | OPHTHALMIC | Status: AC
  Filled 2023-06-25: qty 1

## 2023-06-25 MED ORDER — FLUORESCEIN SODIUM 1 MG OP STRP
1.0000 | ORAL_STRIP | Freq: Once | OPHTHALMIC | Status: AC
  Administered 2023-06-25: 1 via OPHTHALMIC
  Filled 2023-06-25: qty 1

## 2023-06-25 MED ORDER — OXYCODONE-ACETAMINOPHEN 5-325 MG PO TABS
1.0000 | ORAL_TABLET | Freq: Once | ORAL | Status: AC
  Administered 2023-06-25: 1 via ORAL
  Filled 2023-06-25: qty 1
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
## 2023-06-25 NOTE — ED Triage Notes (Signed)
Pt presents to the office for L-eye injury. Pt reports he can not see out of his eye. Pt is unable to open his eye. Pt reports eye is watery.  Pt is not able to answer any question about his medication at this time. Pt denies any LOC.
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
## 2023-06-25 NOTE — Discharge Instructions (Addendum)
We evaluated you for your eye pain.  Your eye exam shows that you have a scrape on your cornea.  These usually heal on their own.  We have given you some antibiotic ointment which you can use to help protect your eye.  Please put this on your lower eyelid 4 times daily.  Please call Dr. Allena Katz for follow-up.  Your corneal abrasion is very large so it is important to make sure that it is healing appropriately.  The sooner you can get in to see him, the better.  We also checked you for your tailbone pain.  We obtained an x-ray which did not show any broken tailbone.  We also obtained a CT scan of your head and your face which did not show any broken bones in your face or any bleeding in your brain.  Please continue with your home pain medication.  If you need to take additional medication, talk to your pain management doctor.  You can add 1000 mg of Tylenol every 6 hours.  If you develop any new or worsening symptoms such as sudden worsening of your vision, increasing pain, fevers, or any other concerning symptoms please return to the emergency department.

## 2023-06-26 DIAGNOSIS — H5712 Ocular pain, left eye: Secondary | ICD-10-CM | POA: Diagnosis not present

## 2023-06-26 DIAGNOSIS — H53142 Visual discomfort, left eye: Secondary | ICD-10-CM | POA: Diagnosis not present

## 2023-06-26 DIAGNOSIS — S0502XA Injury of conjunctiva and corneal abrasion without foreign body, left eye, initial encounter: Secondary | ICD-10-CM | POA: Diagnosis not present

## 2023-06-26 DIAGNOSIS — H11432 Conjunctival hyperemia, left eye: Secondary | ICD-10-CM | POA: Diagnosis not present

## 2023-06-28 ENCOUNTER — Encounter: Payer: Self-pay | Admitting: Internal Medicine

## 2023-06-28 ENCOUNTER — Ambulatory Visit: Payer: 59 | Admitting: Internal Medicine

## 2023-06-28 VITALS — BP 134/58 | HR 74 | Temp 98.4°F | Resp 12 | Ht 70.0 in | Wt 193.0 lb

## 2023-06-28 DIAGNOSIS — H53142 Visual discomfort, left eye: Secondary | ICD-10-CM | POA: Diagnosis not present

## 2023-06-28 DIAGNOSIS — B3781 Candidal esophagitis: Secondary | ICD-10-CM | POA: Diagnosis not present

## 2023-06-28 DIAGNOSIS — R1310 Dysphagia, unspecified: Secondary | ICD-10-CM | POA: Diagnosis not present

## 2023-06-28 DIAGNOSIS — H5712 Ocular pain, left eye: Secondary | ICD-10-CM | POA: Diagnosis not present

## 2023-06-28 DIAGNOSIS — S0502XD Injury of conjunctiva and corneal abrasion without foreign body, left eye, subsequent encounter: Secondary | ICD-10-CM | POA: Diagnosis not present

## 2023-06-28 DIAGNOSIS — H11432 Conjunctival hyperemia, left eye: Secondary | ICD-10-CM | POA: Diagnosis not present

## 2023-06-28 MED ORDER — FLUCONAZOLE 100 MG PO TABS: ORAL_TABLET | ORAL | 0 refills | Status: DC

## 2023-06-28 MED ORDER — SODIUM CHLORIDE 0.9 % IV SOLN: 500.0000 mL | Freq: Once | INTRAVENOUS | Status: DC

## 2023-06-28 NOTE — Op Note (Addendum)
[facility] Patient Name: [name] Procedure Date: 06/28/2023 3:48 PM MRN: [id number] Endoscopist: Iva Boop , MD, [id number] Age: 47 Referring MD:  Date of Birth: [birth year] Gender: Male Account #: [id number] Procedure:                Upper GI endoscopy Indications:              Dysphagia Medicines:                Monitored Anesthesia Care Procedure:                Pre-Anesthesia Assessment:                           - Prior to the procedure, a History and Physical                            was performed, and patient medications and                            allergies were reviewed. The patient's tolerance of                            previous anesthesia was also reviewed. The risks                            and benefits of the procedure and the sedation                            options and risks were discussed with the patient.                            All questions were answered, and informed consent                            was obtained. Prior Anticoagulants: The patient has                            taken no anticoagulant or antiplatelet agents. ASA                            Grade Assessment: III - A patient with severe                            systemic disease. After reviewing the risks and                            benefits, the patient was deemed in satisfactory                            condition to undergo the procedure.                           After obtaining informed consent, the endoscope was  passed under direct vision. Throughout the                            procedure, the patient's blood pressure, pulse, and                            oxygen saturations were monitored continuously. The                            Olympus Scope [id number] was introduced through the                            mouth, and advanced to the second part of duodenum.                            The upper GI endoscopy was  accomplished without                            difficulty. The patient tolerated the procedure                            well. Scope In: Scope Out: Findings:                 Diffuse, white plaques were found in the middle                            third of the esophagus and in the lower third of                            the esophagus. Biopsies were taken with a cold                            forceps for histology. Verification of patient                            identification for the specimen was done. Estimated                            blood loss was minimal.                           The exam was otherwise without abnormality.                           The cardia and gastric fundus were normal on                            retroflexion. Complications:            No immediate complications. Estimated Blood Loss:     Estimated blood loss was minimal. Impression:               - Esophageal plaques were found, consistent with  Candidiasis. Biopsied.                           - The examination was otherwise normal. Recommendation:           - Patient has a contact number available for                            emergencies. The signs and symptoms of potential                            delayed complications were discussed with the                            patient. Return to normal activities tomorrow.                            Written discharge instructions were provided to the                            patient.                           - Resume previous diet.                           - Continue present medications.                           - Await pathology results.                           - May need fluconazole - but potential drug                            interactons if still on tacrolimus Iva Boop, MD 06/28/2023 4:14:13 PM This report has been signed electronically.

## 2023-06-28 NOTE — Progress Notes (Signed)
Called to room to assist during endoscopic procedure.  Patient ID and intended procedure confirmed with present staff. Received instructions for my participation in the procedure from the performing physician.  

## 2023-06-28 NOTE — Progress Notes (Signed)
Pt's states no medical or surgical changes since previsit or office visit. 

## 2023-06-28 NOTE — Progress Notes (Signed)
Sedate, gd SR, tolerated procedure well, VSS, report to RN 

## 2023-06-28 NOTE — Patient Instructions (Addendum)
There is inflammation in the esophagus and it looks like a yeast infection called Candida.  I took biopsies and will let you know the next steps.  I appreciate the opportunity to care for you. Iva Boop, MD, [facility]  Resume previous diet Continue present medications Await pathology results  Handouts/information given for Candidiasis/Thrush  YOU HAD AN ENDOSCOPIC PROCEDURE TODAY AT THE [facility]:   Refer to the procedure report that was given to you for any specific questions about what was found during the examination.  If the procedure report does not answer your questions, please call your gastroenterologist to clarify.  If you requested that your care partner not be given the details of your procedure findings, then the procedure report has been included in a sealed envelope for you to review at your convenience later.  YOU SHOULD EXPECT: Some feelings of bloating in the abdomen. Passage of more gas than usual.  Walking can help get rid of the air that was put into your GI tract during the procedure and reduce the bloating. If you had a lower endoscopy (such as a colonoscopy or flexible sigmoidoscopy) you may notice spotting of blood in your stool or on the toilet paper. If you underwent a bowel prep for your procedure, you may not have a normal bowel movement for a few days.  Please Note:  You might notice some irritation and congestion in your nose or some drainage.  This is from the oxygen used during your procedure.  There is no need for concern and it should clear up in a day or so.  SYMPTOMS TO REPORT IMMEDIATELY:  Following upper endoscopy (EGD)  Vomiting of blood or coffee ground material  New chest pain or pain under the shoulder blades  Painful or persistently difficult swallowing  New shortness of breath  Fever of 100F or higher  Black, tarry-looking stools  For urgent or emergent issues, a gastroenterologist can be reached at any hour by calling [phone]  [phone]. Do not use MyChart messaging for urgent concerns.   DIET:  We do recommend a small meal at first, but then you may proceed to your regular diet.  Drink plenty of fluids but you should avoid alcoholic beverages for 24 hours.  ACTIVITY:  You should plan to take it easy for the rest of today and you should NOT DRIVE or use heavy machinery until tomorrow (because of the sedation medicines used during the test).    FOLLOW UP: Our staff will call the number listed on your records the next business day following your procedure.  We will call around 7:15- 8:00 am to check on you and address any questions or concerns that you may have regarding the information given to you following your procedure. If we do not reach you, we will leave a message.     If any biopsies were taken you will be contacted by phone or by letter within the next 1-3 weeks.  Please call us at [phone] if you have not heard about the biopsies in 3 weeks.   SIGNATURES/CONFIDENTIALITY: You and/or your care partner have signed paperwork which will be entered into your electronic medical record.  These signatures attest to the fact that that the information above on your After Visit Summary has been reviewed and is understood.  Full responsibility of the confidentiality of this discharge information lies with you and/or your care-partner.

## 2023-06-28 NOTE — Progress Notes (Signed)
History and Physical Interval Note:  06/28/2023 3:54 PM  [name]  has presented today for endoscopic procedure(s), with the diagnosis of  Encounter Diagnosis  Name Primary?   Dysphagia, unspecified type Yes  .  The various methods of evaluation and treatment have been discussed with the patient and/or family. After consideration of risks, benefits and other options for treatment, the patient has consented to  the endoscopic procedure(s).   The patient's history has been reviewed, patient examined, no change in status, stable for endoscopic procedure(s).  I have reviewed the patient's chart and labs.  Questions were answered to the patient's satisfaction.     Iva Boop, MD, [facility]

## 2023-06-30 ENCOUNTER — Telehealth: Payer: Self-pay | Admitting: *Deleted

## 2023-06-30 NOTE — Telephone Encounter (Signed)
  Follow up Call-     06/28/2023    3:10 PM  Call back number  Post procedure Call Back phone  # [phone]  Permission to leave phone message Yes   No answer, mailbox full

## 2023-07-03 DIAGNOSIS — R509 Fever, unspecified: Principal | ICD-10-CM | POA: Insufficient documentation

## 2023-07-03 DIAGNOSIS — Z940 Kidney transplant status: Secondary | ICD-10-CM | POA: Diagnosis not present

## 2023-07-03 DIAGNOSIS — H5319 Other subjective visual disturbances: Secondary | ICD-10-CM | POA: Diagnosis not present

## 2023-07-03 DIAGNOSIS — Z992 Dependence on renal dialysis: Secondary | ICD-10-CM | POA: Insufficient documentation

## 2023-07-03 DIAGNOSIS — Z89512 Acquired absence of left leg below knee: Secondary | ICD-10-CM | POA: Diagnosis not present

## 2023-07-03 DIAGNOSIS — N390 Urinary tract infection, site not specified: Secondary | ICD-10-CM | POA: Insufficient documentation

## 2023-07-03 DIAGNOSIS — Z7982 Long term (current) use of aspirin: Secondary | ICD-10-CM | POA: Insufficient documentation

## 2023-07-03 DIAGNOSIS — Z1152 Encounter for screening for COVID-19: Secondary | ICD-10-CM | POA: Insufficient documentation

## 2023-07-03 DIAGNOSIS — B9620 Unspecified Escherichia coli [E. coli] as the cause of diseases classified elsewhere: Secondary | ICD-10-CM | POA: Diagnosis not present

## 2023-07-03 DIAGNOSIS — D849 Immunodeficiency, unspecified: Secondary | ICD-10-CM | POA: Diagnosis not present

## 2023-07-03 DIAGNOSIS — I10 Essential (primary) hypertension: Secondary | ICD-10-CM | POA: Insufficient documentation

## 2023-07-03 DIAGNOSIS — R0989 Other specified symptoms and signs involving the circulatory and respiratory systems: Secondary | ICD-10-CM | POA: Diagnosis not present

## 2023-07-03 DIAGNOSIS — S0502XS Injury of conjunctiva and corneal abrasion without foreign body, left eye, sequela: Secondary | ICD-10-CM | POA: Diagnosis not present

## 2023-07-03 DIAGNOSIS — Z79899 Other long term (current) drug therapy: Secondary | ICD-10-CM | POA: Diagnosis not present

## 2023-07-03 DIAGNOSIS — E1043 Type 1 diabetes mellitus with diabetic autonomic (poly)neuropathy: Secondary | ICD-10-CM | POA: Diagnosis not present

## 2023-07-03 DIAGNOSIS — Z87891 Personal history of nicotine dependence: Secondary | ICD-10-CM | POA: Diagnosis not present

## 2023-07-03 DIAGNOSIS — Z89511 Acquired absence of right leg below knee: Secondary | ICD-10-CM | POA: Insufficient documentation

## 2023-07-03 DIAGNOSIS — R059 Cough, unspecified: Secondary | ICD-10-CM | POA: Diagnosis not present

## 2023-07-03 DIAGNOSIS — Z9483 Pancreas transplant status: Secondary | ICD-10-CM | POA: Insufficient documentation

## 2023-07-03 DIAGNOSIS — H11432 Conjunctival hyperemia, left eye: Secondary | ICD-10-CM | POA: Diagnosis not present

## 2023-07-04 ENCOUNTER — Emergency Department (HOSPITAL_COMMUNITY): Payer: 59

## 2023-07-04 ENCOUNTER — Encounter (HOSPITAL_COMMUNITY): Payer: Self-pay

## 2023-07-04 ENCOUNTER — Other Ambulatory Visit: Payer: Self-pay

## 2023-07-04 ENCOUNTER — Observation Stay (HOSPITAL_COMMUNITY)
Admission: EM | Admit: 2023-07-04 | Discharge: 2023-07-06 | Disposition: A | Discharge status: Home / Self Care | Payer: 59 | Attending: Internal Medicine | Admitting: Internal Medicine

## 2023-07-04 DIAGNOSIS — R0989 Other specified symptoms and signs involving the circulatory and respiratory systems: Secondary | ICD-10-CM | POA: Diagnosis not present

## 2023-07-04 DIAGNOSIS — R059 Cough, unspecified: Secondary | ICD-10-CM | POA: Diagnosis not present

## 2023-07-04 DIAGNOSIS — G8929 Other chronic pain: Secondary | ICD-10-CM | POA: Diagnosis present

## 2023-07-04 DIAGNOSIS — E785 Hyperlipidemia, unspecified: Secondary | ICD-10-CM | POA: Diagnosis present

## 2023-07-04 DIAGNOSIS — R5081 Fever presenting with conditions classified elsewhere: Secondary | ICD-10-CM | POA: Diagnosis not present

## 2023-07-04 DIAGNOSIS — Z940 Kidney transplant status: Secondary | ICD-10-CM

## 2023-07-04 DIAGNOSIS — M791 Myalgia, unspecified site: Secondary | ICD-10-CM

## 2023-07-04 DIAGNOSIS — F1220 Cannabis dependence, uncomplicated: Secondary | ICD-10-CM | POA: Diagnosis present

## 2023-07-04 DIAGNOSIS — R509 Fever, unspecified: Secondary | ICD-10-CM | POA: Diagnosis present

## 2023-07-04 DIAGNOSIS — D849 Immunodeficiency, unspecified: Secondary | ICD-10-CM

## 2023-07-04 DIAGNOSIS — R519 Headache, unspecified: Secondary | ICD-10-CM

## 2023-07-04 DIAGNOSIS — F17200 Nicotine dependence, unspecified, uncomplicated: Secondary | ICD-10-CM | POA: Diagnosis present

## 2023-07-04 DIAGNOSIS — I10 Essential (primary) hypertension: Secondary | ICD-10-CM | POA: Diagnosis present

## 2023-07-04 DIAGNOSIS — E1043 Type 1 diabetes mellitus with diabetic autonomic (poly)neuropathy: Secondary | ICD-10-CM | POA: Diagnosis present

## 2023-07-04 DIAGNOSIS — Z89511 Acquired absence of right leg below knee: Secondary | ICD-10-CM

## 2023-07-04 DIAGNOSIS — Z9483 Pancreas transplant status: Secondary | ICD-10-CM

## 2023-07-04 LAB — COMPREHENSIVE METABOLIC PANEL
ALT: 8 U/L (ref 0–44)
AST: 12 U/L — ABNORMAL LOW (ref 15–41)
Albumin: 3.2 g/dL — ABNORMAL LOW (ref 3.5–5.0)
Alkaline Phosphatase: 79 U/L (ref 38–126)
Anion gap: 16 — ABNORMAL HIGH (ref 5–15)
BUN: 16 mg/dL (ref 6–20)
CO2: 16 mmol/L — ABNORMAL LOW (ref 22–32)
Calcium: 9 mg/dL (ref 8.9–10.3)
Chloride: 103 mmol/L (ref 98–111)
Creatinine, Ser: 1.64 mg/dL — ABNORMAL HIGH (ref 0.61–1.24)
GFR, Estimated: 51 mL/min — ABNORMAL LOW (ref >60)
Glucose, Bld: 115 mg/dL — ABNORMAL HIGH (ref 70–99)
Potassium: 3.8 mmol/L (ref 3.5–5.1)
Sodium: 135 mmol/L (ref 135–145)
Total Bilirubin: 0.9 mg/dL (ref 0.3–1.2)
Total Protein: 7.5 g/dL (ref 6.5–8.1)

## 2023-07-04 LAB — RESPIRATORY PANEL BY PCR

## 2023-07-04 LAB — CBC WITH DIFFERENTIAL/PLATELET
Abs Immature Granulocytes: 0.06 10*3/uL (ref 0.00–0.07)
Basophils Absolute: 0.1 10*3/uL (ref 0.0–0.1)
Basophils Relative: 1 %
Eosinophils Absolute: 0.1 10*3/uL (ref 0.0–0.5)
Eosinophils Relative: 1 %
HCT: 54.5 % — ABNORMAL HIGH (ref 39.0–52.0)
Hemoglobin: 17.3 g/dL — ABNORMAL HIGH (ref 13.0–17.0)
Immature Granulocytes: 1 %
Lymphocytes Relative: 12 %
Lymphs Abs: 1.2 10*3/uL (ref 0.7–4.0)
MCH: 30.6 pg (ref 26.0–34.0)
MCHC: 31.7 g/dL (ref 30.0–36.0)
MCV: 96.5 fL (ref 80.0–100.0)
Monocytes Absolute: 1.2 10*3/uL — ABNORMAL HIGH (ref 0.1–1.0)
Monocytes Relative: 12 %
Neutro Abs: 7.5 10*3/uL (ref 1.7–7.7)
Neutrophils Relative %: 73 %
Platelets: 252 10*3/uL (ref 150–400)
RBC: 5.65 MIL/uL (ref 4.22–5.81)
RDW: 14.5 % (ref 11.5–15.5)
WBC: 10.1 10*3/uL (ref 4.0–10.5)
nRBC: 0 % (ref 0.0–0.2)

## 2023-07-04 LAB — RESP PANEL BY RT-PCR (RSV, FLU A&B, COVID)  RVPGX2
Influenza A by PCR: NEGATIVE
Influenza B by PCR: NEGATIVE
Resp Syncytial Virus by PCR: NEGATIVE
SARS Coronavirus 2 by RT PCR: NEGATIVE

## 2023-07-04 LAB — URINALYSIS, W/ REFLEX TO CULTURE (INFECTION SUSPECTED)
Bilirubin Urine: NEGATIVE
Glucose, UA: NEGATIVE mg/dL
Ketones, ur: NEGATIVE mg/dL
Nitrite: NEGATIVE
Protein, ur: 30 mg/dL — AB
Specific Gravity, Urine: 1.013 (ref 1.005–1.030)
pH: 6 (ref 5.0–8.0)

## 2023-07-04 LAB — RAPID URINE DRUG SCREEN, HOSP PERFORMED
Amphetamines: NOT DETECTED
Barbiturates: NOT DETECTED
Benzodiazepines: NOT DETECTED
Cocaine: NOT DETECTED
Opiates: NOT DETECTED
Tetrahydrocannabinol: POSITIVE — AB

## 2023-07-04 LAB — PROTIME-INR
INR: 1.1 (ref 0.8–1.2)
Prothrombin Time: 14.7 seconds (ref 11.4–15.2)

## 2023-07-04 LAB — LACTIC ACID, PLASMA
Lactic Acid, Venous: 1.7 mmol/L (ref 0.5–1.9)
Lactic Acid, Venous: 1.4 mmol/L (ref 0.5–1.9)

## 2023-07-04 LAB — HIV ANTIBODY (ROUTINE TESTING W REFLEX): HIV Screen 4th Generation wRfx: NONREACTIVE

## 2023-07-04 MED ORDER — LACTATED RINGERS IV SOLN: INTRAVENOUS | Status: DC

## 2023-07-04 MED ORDER — ALBUTEROL SULFATE (2.5 MG/3ML) 0.083% IN NEBU: 2.5000 mg | INHALATION_SOLUTION | RESPIRATORY_TRACT | Status: DC | PRN

## 2023-07-04 MED ORDER — PANTOPRAZOLE SODIUM 40 MG PO TBEC
40.0000 mg | DELAYED_RELEASE_TABLET | Freq: Every day | ORAL | Status: DC
  Administered 2023-07-04 – 2023-07-06 (×3): 40 mg via ORAL
  Filled 2023-07-04 (×3): qty 1

## 2023-07-04 MED ORDER — MYCOPHENOLATE SODIUM 180 MG PO TBEC
360.0000 mg | DELAYED_RELEASE_TABLET | Freq: Two times a day (BID) | ORAL | Status: DC
  Administered 2023-07-04 – 2023-07-06 (×5): 360 mg via ORAL
  Filled 2023-07-04 (×5): qty 2

## 2023-07-04 MED ORDER — ONDANSETRON HCL 4 MG/2ML IJ SOLN: 4.0000 mg | Freq: Four times a day (QID) | INTRAMUSCULAR | Status: DC | PRN

## 2023-07-04 MED ORDER — HYDRALAZINE HCL 50 MG PO TABS
25.0000 mg | ORAL_TABLET | Freq: Two times a day (BID) | ORAL | Status: DC
  Administered 2023-07-04 – 2023-07-06 (×5): 25 mg via ORAL
  Filled 2023-07-04 (×5): qty 1

## 2023-07-04 MED ORDER — BISACODYL 5 MG PO TBEC: 5.0000 mg | DELAYED_RELEASE_TABLET | Freq: Every day | ORAL | Status: DC | PRN

## 2023-07-04 MED ORDER — OXYCODONE HCL 5 MG PO TABS
15.0000 mg | ORAL_TABLET | Freq: Every day | ORAL | Status: DC | PRN
  Administered 2023-07-06: 15 mg via ORAL
  Filled 2023-07-04: qty 3

## 2023-07-04 MED ORDER — ACETAMINOPHEN 325 MG PO TABS: 650.0000 mg | ORAL_TABLET | Freq: Four times a day (QID) | ORAL | Status: DC | PRN

## 2023-07-04 MED ORDER — LACTATED RINGERS IV BOLUS
1000.0000 mL | Freq: Once | INTRAVENOUS | Status: AC
  Administered 2023-07-04: 1000 mL via INTRAVENOUS

## 2023-07-04 MED ORDER — SODIUM CHLORIDE 0.9 % IV SOLN
1.0000 g | Freq: Once | INTRAVENOUS | Status: AC
  Administered 2023-07-04: 1 g via INTRAVENOUS
  Filled 2023-07-04: qty 10

## 2023-07-04 MED ORDER — SODIUM CHLORIDE 0.9 % IV SOLN
1.0000 g | INTRAVENOUS | Status: DC
  Administered 2023-07-05 – 2023-07-06 (×2): 1 g via INTRAVENOUS
  Filled 2023-07-04 (×2): qty 10

## 2023-07-04 MED ORDER — VANCOMYCIN HCL 1750 MG/350ML IV SOLN
1750.0000 mg | Freq: Once | INTRAVENOUS | Status: AC
  Administered 2023-07-04: 1750 mg via INTRAVENOUS
  Filled 2023-07-04: qty 350

## 2023-07-04 MED ORDER — NICOTINE 14 MG/24HR TD PT24
14.0000 mg | MEDICATED_PATCH | Freq: Every day | TRANSDERMAL | Status: DC
  Filled 2023-07-04 (×2): qty 1

## 2023-07-04 MED ORDER — POLYETHYLENE GLYCOL 3350 17 G PO PACK: 17.0000 g | PACK | Freq: Every day | ORAL | Status: DC | PRN

## 2023-07-04 MED ORDER — METOCLOPRAMIDE HCL 5 MG PO TABS
10.0000 mg | ORAL_TABLET | Freq: Four times a day (QID) | ORAL | Status: DC
  Administered 2023-07-04 – 2023-07-06 (×6): 10 mg via ORAL
  Filled 2023-07-04 (×7): qty 2

## 2023-07-04 MED ORDER — SULFAMETHOXAZOLE-TRIMETHOPRIM 400-80 MG PO TABS
1.0000 | ORAL_TABLET | ORAL | Status: DC
  Administered 2023-07-05: 1 via ORAL
  Filled 2023-07-04: qty 1

## 2023-07-04 MED ORDER — ONDANSETRON HCL 4 MG PO TABS: 4.0000 mg | ORAL_TABLET | Freq: Four times a day (QID) | ORAL | Status: DC | PRN

## 2023-07-04 MED ORDER — ENOXAPARIN SODIUM 40 MG/0.4ML IJ SOSY
40.0000 mg | PREFILLED_SYRINGE | INTRAMUSCULAR | Status: DC
  Filled 2023-07-04 (×2): qty 0.4

## 2023-07-04 MED ORDER — ACETAMINOPHEN 650 MG RE SUPP: 650.0000 mg | Freq: Four times a day (QID) | RECTAL | Status: DC | PRN

## 2023-07-04 MED ORDER — ACETAMINOPHEN 325 MG PO TABS
650.0000 mg | ORAL_TABLET | Freq: Once | ORAL | Status: AC
  Administered 2023-07-04: 650 mg via ORAL
  Filled 2023-07-04: qty 2

## 2023-07-04 MED ORDER — HYDRALAZINE HCL 20 MG/ML IJ SOLN: 5.0000 mg | INTRAMUSCULAR | Status: DC | PRN

## 2023-07-04 MED ORDER — PREDNISONE 10 MG PO TABS
5.0000 mg | ORAL_TABLET | Freq: Every day | ORAL | Status: DC
  Administered 2023-07-04 – 2023-07-06 (×3): 5 mg via ORAL
  Filled 2023-07-04 (×3): qty 1

## 2023-07-04 MED ORDER — OFLOXACIN 0.3 % OP SOLN
1.0000 [drp] | Freq: Four times a day (QID) | OPHTHALMIC | Status: DC
  Administered 2023-07-04 – 2023-07-06 (×6): 1 [drp] via OPHTHALMIC
  Filled 2023-07-04: qty 5
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
## 2023-07-04 NOTE — Progress Notes (Signed)
ED Pharmacy Antibiotic Sign Off An antibiotic consult was received from an ED provider for vancomycin per pharmacy dosing for wound infection. A chart review was completed to assess appropriateness.   The following one time order(s) were placed:  Vancomycin 1750mg  IV x1  Further antibiotic and/or antibiotic pharmacy consults should be ordered by the admitting provider if indicated.   Thank you for allowing pharmacy to be a part of this patient's care.   Vernard Gambles, PharmD, BCPS  Clinical Pharmacist 07/04/23 4:46 AM

## 2023-07-04 NOTE — ED Triage Notes (Signed)
Pt arrived from home via POV c/o fever x 2 days, n/v, fatigue. Pt called PCP and was sent to ER. Per pt PCP concerned about rejection of renal and pancreatic transplants x 10 years ago.
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
## 2023-07-04 NOTE — ED Provider Triage Note (Signed)
Emergency Medicine Provider Triage Evaluation Note  [name] , a 47 y.o. male  was evaluated in triage.  Pt complains of fever.  Sent in by PCP due to immunocompromised state.  He is s/p renal and pancreatic transplant 10 years ago at [facility].  He is currently on myfortic and belatacept.  Fever began today while at the eye doctors office.  Some cough/congestion, did vomit earlier today as well.  Denies any abdominal pain.  Review of Systems  Positive: fever Negative: Chest pain, SOB  Physical Exam  BP 131/74 (BP Location: Right Arm)   Pulse 95   Temp 100 F (37.8 C) (Oral)   Resp 17   SpO2 97%  Gen:   Awake, no distress   Resp:  Normal effort  MSK:   Moves extremities without difficulty  Other:  Sounds congested  Medical Decision Making  Medically screening exam initiated at 2:29 AM.  Appropriate orders placed.  [name] was informed that the remainder of the evaluation will be completed by another provider, this initial triage assessment does not replace that evaluation, and the importance of remaining in the ED until their evaluation is complete.  Fever, on immunosuppressants.  Will initiate sepsis work-up including labs, CXR, RVP and viral panel.   Andrew Hatchet, PA-C 07/04/23 [phone]

## 2023-07-04 NOTE — ED Notes (Signed)
ED TO INPATIENT HANDOFF REPORT  ED Nurse Name and Phone #:  Bryceson Grape [phone]   S Name/Age/Gender [name] 47 y.o. male Room/Bed: 034C/034C  Code Status   Code Status: Full Code  Home/SNF/Other Home Patient oriented to: self, place, time, and situation Is this baseline? Yes   Triage Complete: Triage complete  Chief Complaint Fever [R50.9]  Triage Note Pt arrived from home via POV c/o fever x 2 days, n/v, fatigue. Pt called PCP and was sent to ER. Per pt PCP concerned about rejection of renal and pancreatic transplants x 10 years ago.    Allergies Allergies  Allergen Reactions   Amlodipine Rash   Lisinopril Rash    Level of Care/Admitting Diagnosis ED Disposition     ED Disposition  Admit   Condition  --   Comment  Hospital Area: [facility] [[id number]]  Level of Care: Med-Surg [16]  May place patient in observation at [facility] or [facility] if equivalent level of care is available:: Yes  Covid Evaluation: Confirmed COVID Negative  Diagnosis: Fever [098119]  Admitting Physician: Jonah Blue [[id number]]  Attending Physician: Jonah Blue [[id number]]          B Medical/Surgery History Past Medical History:  Diagnosis Date   AMPUTATION, BELOW KNEE, 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   Diabetes mellitus without complication [facility])    no since pancreas transplant   Dialysis patient [facility]) 04/18/2012   "[facility]; [place], [place], [place]"   Gastroparesis    Gastropathy    GERD (gastroesophageal reflux disease)    Hypertension    MRSA infection    over 10 years ago per patient. in legs   Past Surgical History:  Procedure Laterality Date   AV FISTULA PLACEMENT  08/2011   left upper arm   BELOW KNEE LEG AMPUTATION  "it's been awhile"   bilaterally   CATARACT EXTRACTION   ~ 2011   right   COMBINED KIDNEY-PANCREAS TRANSPLANT  2014   ESOPHAGOGASTRODUODENOSCOPY N/A 12/30/2016   Procedure: ESOPHAGOGASTRODUODENOSCOPY (EGD);  Surgeon: Jeani Hawking, MD;  [facility]: [facility] ENDOSCOPY;  Service: Endoscopy;  Laterality: N/A;   OLECRANON BURSECTOMY Right 06/23/2020   Procedure: RIGHT ELBOW EXCISION OLECRANON BURSITIS;  Surgeon: Nadara Mustard, MD;  Location: [facility];  Service: Orthopedics;  Laterality: Right;     A IV Location/Drains/Wounds Patient Lines/Drains/Airways Status     Active Line/Drains/Airways     Name Placement date Placement time Site Days   Peripheral IV 07/04/23 18 G Left Antecubital 07/04/23  0439  Antecubital  less than 1   Pressure Injury 07/17/17 Stage II -  Partial thickness loss of dermis presenting as a shallow open ulcer with a red, pink wound bed without slough. 0.75 cm x 0.75 cm 07/17/17  2115  -- 2178   Wound / Incision (Open or Dehisced) 05/14/17 Other (Comment) Hand Left Skin red and swollen. 05/14/17  1252  Hand  2242            Intake/Output Last 24 hours  Intake/Output Summary (Last 24 hours) at 07/04/2023 0919 Last data filed at 07/04/2023 0911 Gross per 24 hour  Intake 1451.39 ml  Output --  Net 1451.39 ml    Labs/Imaging Results for orders  placed or performed during the hospital encounter of 07/04/23 (from the past 48 hour(s))  Comprehensive metabolic panel     Status: Abnormal   Collection Time: 07/04/23  2:22 AM  Result Value Ref Range   Sodium 135 135 - 145 mmol/L   Potassium 3.8 3.5 - 5.1 mmol/L   Chloride 103 98 - 111 mmol/L   CO2 16 (L) 22 - 32 mmol/L   Glucose, Bld 115 (H) 70 - 99 mg/dL    Comment: Glucose reference range applies only to samples taken after fasting for at least 8 hours.   BUN 16 6 - 20 mg/dL   Creatinine, Ser 1.61 (H) 0.61 - 1.24 mg/dL   Calcium 9.0 8.9 - 09.6 mg/dL   Total Protein 7.5 6.5 - 8.1 g/dL   Albumin 3.2 (L) 3.5 - 5.0 g/dL   AST 12 (L) 15 - 41 U/L   ALT 8 0 - 44 U/L    Alkaline Phosphatase 79 38 - 126 U/L   Total Bilirubin 0.9 0.3 - 1.2 mg/dL   GFR, Estimated 51 (L) >60 mL/min    Comment: (NOTE) Calculated using the CKD-EPI Creatinine Equation (2021)    Anion gap 16 (H) 5 - 15    Comment: Performed at [facility], [address] [place], [place] [address]  Lactic acid, plasma     Status: None   Collection Time: 07/04/23  2:22 AM  Result Value Ref Range   Lactic Acid, Venous 1.7 0.5 - 1.9 mmol/L    Comment: Performed at [facility], [address], [place], [place] [address]  CBC with Differential     Status: Abnormal   Collection Time: 07/04/23  2:22 AM  Result Value Ref Range   WBC 10.1 4.0 - 10.5 K/uL   RBC 5.65 4.22 - 5.81 MIL/uL   Hemoglobin 17.3 (H) 13.0 - 17.0 g/dL   HCT 14.7 (H) 82.9 - 56.2 %   MCV 96.5 80.0 - 100.0 fL   MCH 30.6 26.0 - 34.0 pg   MCHC 31.7 30.0 - 36.0 g/dL   RDW 13.0 86.5 - 78.4 %   Platelets 252 150 - 400 K/uL   nRBC 0.0 0.0 - 0.2 %   Neutrophils Relative % 73 %   Neutro Abs 7.5 1.7 - 7.7 K/uL   Lymphocytes Relative 12 %   Lymphs Abs 1.2 0.7 - 4.0 K/uL   Monocytes Relative 12 %   Monocytes Absolute 1.2 (H) 0.1 - 1.0 K/uL   Eosinophils Relative 1 %   Eosinophils Absolute 0.1 0.0 - 0.5 K/uL   Basophils Relative 1 %   Basophils Absolute 0.1 0.0 - 0.1 K/uL   Immature Granulocytes 1 %   Abs Immature Granulocytes 0.06 0.00 - 0.07 K/uL    Comment: Performed at [facility], [address] [place], [place] [address]  Protime-INR     Status: None   Collection Time: 07/04/23  2:22 AM  Result Value Ref Range   Prothrombin Time 14.7 11.4 - 15.2 seconds   INR 1.1 0.8 - 1.2    Comment: (NOTE) INR goal varies based on device and disease states. Performed at [facility], [address], [place], [place] [address]   Resp panel by RT-PCR (RSV, Flu A&B, Covid) Anterior Nasal Swab     Status: None   Collection Time: 07/04/23  2:30 AM   Specimen: Anterior Nasal Swab  Result Value Ref Range   SARS  Coronavirus 2 by  RT PCR NEGATIVE NEGATIVE   Influenza A by PCR NEGATIVE NEGATIVE   Influenza B by PCR NEGATIVE NEGATIVE    Comment: (NOTE) The Xpert Xpress SARS-CoV-2/FLU/RSV plus assay is intended as an aid in the diagnosis of influenza from Nasopharyngeal swab specimens and should not be used as a sole basis for treatment. Nasal washings and aspirates are unacceptable for Xpert Xpress SARS-CoV-2/FLU/RSV testing.  Fact Sheet for Patients: BloggerCourse.com  Fact Sheet for Healthcare Providers: SeriousBroker.it  This test is not yet approved or cleared by the [place] FDA and has been authorized for detection and/or diagnosis of SARS-CoV-2 by FDA under an Emergency Use Authorization (EUA). This EUA will remain in effect (meaning this test can be used) for the duration of the COVID-19 declaration under Section 564(b)(1) of the Act, 21 U.S.C. section 360bbb-3(b)(1), unless the authorization is terminated or revoked.     Resp Syncytial Virus by PCR NEGATIVE NEGATIVE    Comment: (NOTE) Fact Sheet for Patients: BloggerCourse.com  Fact Sheet for Healthcare Providers: SeriousBroker.it  This test is not yet approved or cleared by the [place] FDA and has been authorized for detection and/or diagnosis of SARS-CoV-2 by FDA under an Emergency Use Authorization (EUA). This EUA will remain in effect (meaning this test can be used) for the duration of the COVID-19 declaration under Section 564(b)(1) of the Act, 21 U.S.C. section 360bbb-3(b)(1), unless the authorization is terminated or revoked.  Performed at [facility], [address] [place], [place] [address]   Respiratory (~20 pathogens) panel by PCR     Status: None   Collection Time: 07/04/23  2:30 AM   Specimen: Nasopharyngeal Swab; Respiratory  Result Value Ref Range   Adenovirus NOT DETECTED NOT DETECTED    Coronavirus 229E NOT DETECTED NOT DETECTED    Comment: (NOTE) The Coronavirus on the Respiratory Panel, DOES NOT test for the novel  Coronavirus (2019 nCoV)    Coronavirus HKU1 NOT DETECTED NOT DETECTED   Coronavirus NL63 NOT DETECTED NOT DETECTED   Coronavirus OC43 NOT DETECTED NOT DETECTED   Metapneumovirus NOT DETECTED NOT DETECTED   Rhinovirus / Enterovirus NOT DETECTED NOT DETECTED   Influenza A NOT DETECTED NOT DETECTED   Influenza B NOT DETECTED NOT DETECTED   Parainfluenza Virus 1 NOT DETECTED NOT DETECTED   Parainfluenza Virus 2 NOT DETECTED NOT DETECTED   Parainfluenza Virus 3 NOT DETECTED NOT DETECTED   Parainfluenza Virus 4 NOT DETECTED NOT DETECTED   Respiratory Syncytial Virus NOT DETECTED NOT DETECTED   Bordetella pertussis NOT DETECTED NOT DETECTED   Bordetella Parapertussis NOT DETECTED NOT DETECTED   Chlamydophila pneumoniae NOT DETECTED NOT DETECTED   Mycoplasma pneumoniae NOT DETECTED NOT DETECTED    Comment: Performed at [facility], [address] [place], [place] [address]  Lactic acid, plasma     Status: None   Collection Time: 07/04/23  5:15 AM  Result Value Ref Range   Lactic Acid, Venous 1.4 0.5 - 1.9 mmol/L    Comment: Performed at [facility], [address] [place], [place] [address]   DG Chest 2 View  Result Date: 07/04/2023 CLINICAL DATA:  Cough and congestion. EXAM: CHEST - 2 VIEW COMPARISON:  June 02, 2023 FINDINGS: The heart size and mediastinal contours are within normal limits. There is no evidence of acute infiltrate, pleural effusion or pneumothorax. The visualized skeletal structures are unremarkable. IMPRESSION: No active cardiopulmonary disease. Electronically Signed   By: Aram Candela M.D.   On:  07/04/2023 03:02    Pending Labs Unresulted Labs (From admission, onward)     Start     Ordered   07/05/23 0500  Basic metabolic panel  Tomorrow morning,   R        07/04/23 0912   07/05/23 0500  CBC  Tomorrow morning,   R         07/04/23 0912   07/04/23 0911  HIV Antibody (routine testing w rflx)  (HIV Antibody (Routine testing w reflex) panel)  Once,   R        07/04/23 0912   07/04/23 0909  Rapid urine drug screen (hospital performed)  ONCE - STAT,   STAT        07/04/23 0912   07/04/23 0213  Culture, blood (Routine x 2)  BLOOD CULTURE X 2,   R (with STAT occurrences)      07/04/23 0212   07/04/23 0213  Urinalysis, w/ Reflex to Culture (Infection Suspected) -Urine, Clean Catch  Once,   URGENT       Question:  Specimen Source  Answer:  Urine, Clean Catch   07/04/23 0212            Vitals/Pain Today's Vitals   07/04/23 0615 07/04/23 0630 07/04/23 0830 07/04/23 0900  BP:  130/63  (!) 164/62  Pulse:  76 66   Resp:  20 19 20   Temp:      TempSrc:      SpO2:  94% 98%   PainSc: Asleep       Isolation Precautions No active isolations  Medications Medications  sulfamethoxazole-trimethoprim (BACTRIM) 400-80 MG per tablet 1 tablet (has no administration in time range)  oxyCODONE (Oxy IR/ROXICODONE) immediate release tablet 15 mg (has no administration in time range)  hydrALAZINE (APRESOLINE) tablet 25 mg (has no administration in time range)  predniSONE (DELTASONE) tablet 5 mg (has no administration in time range)  metoCLOPramide (REGLAN) tablet 10 mg (has no administration in time range)  pantoprazole (PROTONIX) EC tablet 40 mg (has no administration in time range)  mycophenolate (MYFORTIC) EC tablet 360 mg (has no administration in time range)  ofloxacin (OCUFLOX) 0.3 % ophthalmic solution 1 drop (has no administration in time range)  enoxaparin (LOVENOX) injection 40 mg (has no administration in time range)  lactated ringers infusion (has no administration in time range)  acetaminophen (TYLENOL) tablet 650 mg (has no administration in time range)    Or  acetaminophen (TYLENOL) suppository 650 mg (has no administration in time range)  polyethylene glycol (MIRALAX / GLYCOLAX) packet 17 g (has no  administration in time range)  bisacodyl (DULCOLAX) EC tablet 5 mg (has no administration in time range)  ondansetron (ZOFRAN) tablet 4 mg (has no administration in time range)    Or  ondansetron (ZOFRAN) injection 4 mg (has no administration in time range)  nicotine (NICODERM CQ - dosed in mg/24 hours) patch 14 mg (has no administration in time range)  hydrALAZINE (APRESOLINE) injection 5 mg (has no administration in time range)  albuterol (PROVENTIL) (2.5 MG/3ML) 0.083% nebulizer solution 2.5 mg (has no administration in time range)  lactated ringers bolus 1,000 mL (0 mLs Intravenous Stopped 07/04/23 0757)  cefTRIAXone (ROCEPHIN) 1 g in sodium chloride 0.9 % 100 mL IVPB (0 g Intravenous Stopped 07/04/23 0629)  vancomycin (VANCOREADY) IVPB 1750 mg/350 mL (0 mg Intravenous Stopped 07/04/23 0911)  acetaminophen (TYLENOL) tablet 650 mg (650 mg Oral Given 07/04/23 0457)    Mobility manual wheelchair     Focused  Assessments Renal Assessment Handoff:  Hemodialysis Schedule:  Last Hemodialysis date and time:    Restricted appendage:     R Recommendations: See Admitting Provider Note  Report given to:   Additional Notes:

## 2023-07-05 DIAGNOSIS — D849 Immunodeficiency, unspecified: Secondary | ICD-10-CM | POA: Diagnosis not present

## 2023-07-05 DIAGNOSIS — R509 Fever, unspecified: Secondary | ICD-10-CM | POA: Diagnosis not present

## 2023-07-05 LAB — CBC
HCT: 45.8 % (ref 39.0–52.0)
Hemoglobin: 14.9 g/dL (ref 13.0–17.0)
MCH: 30.8 pg (ref 26.0–34.0)
MCHC: 32.5 g/dL (ref 30.0–36.0)
MCV: 94.6 fL (ref 80.0–100.0)
Platelets: 253 10*3/uL (ref 150–400)
RBC: 4.84 MIL/uL (ref 4.22–5.81)
RDW: 14.3 % (ref 11.5–15.5)
WBC: 8.6 10*3/uL (ref 4.0–10.5)
nRBC: 0 % (ref 0.0–0.2)

## 2023-07-05 LAB — BASIC METABOLIC PANEL
Anion gap: 7 (ref 5–15)
BUN: 16 mg/dL (ref 6–20)
CO2: 21 mmol/L — ABNORMAL LOW (ref 22–32)
Calcium: 8.6 mg/dL — ABNORMAL LOW (ref 8.9–10.3)
Chloride: 110 mmol/L (ref 98–111)
Creatinine, Ser: 1.48 mg/dL — ABNORMAL HIGH (ref 0.61–1.24)
GFR, Estimated: 58 mL/min — ABNORMAL LOW (ref >60)
Glucose, Bld: 122 mg/dL — ABNORMAL HIGH (ref 70–99)
Potassium: 3.6 mmol/L (ref 3.5–5.1)
Sodium: 138 mmol/L (ref 135–145)

## 2023-07-05 LAB — URINE CULTURE: Culture: 10000 — AB

## 2023-07-05 MED ORDER — CALCIUM CARBONATE ANTACID 500 MG PO CHEW
1.0000 | CHEWABLE_TABLET | Freq: Two times a day (BID) | ORAL | Status: DC | PRN
  Filled 2023-07-05: qty 1

## 2023-07-05 MED ORDER — HYDRALAZINE HCL 20 MG/ML IJ SOLN: 10.0000 mg | INTRAMUSCULAR | Status: DC | PRN

## 2023-07-05 NOTE — Progress Notes (Signed)
  Progress Note   Patient: [name] ZOX:[id number] DOB: 18-Oct-1976 DOA: 07/04/2023     0 DOS: the patient was seen and examined on 07/05/2023   Brief hospital course: 47 y.o. male with medical history significant of PVD s/p B BKA, chronic pain, DM, h/o renal/pancreas transplant and no longer on HD, and HTN presenting with fever.   He previously had a traumatic corneal abrasion and went to the eye doctor yesterday.  He felt fine going in but developed fever shortly thereafter to 101.  He had difficulty urinating and stooling, no dysuria.  + headache that is now somewhat improved.  No n/v/d, no URI symptoms.  No sick contacts.   Assessment and Plan: Fever secondary to ecoli UTI Patient with h/o immunosuppression presenting with fever Nonspecific symptoms but he has had some difficulty urinating UA is suggestive of UTI, culture thus far pos for E.coli. Sensitivities pending Continue Rocephin Blood cultures neg thus far HIV non reactive -Pt reports feeling better today   H/o renal/pancreas transplant Remote (10 years) On immunosuppressants - prednisone, belatacept, mycophenolate Continue Bactrim   PVD s/p B BKA Stumps appear C/D/I, unlikely related to current presentation   HTN Continue hydralazine   DM Now controlled off medications, A1c 6.0 Continue Reglan for gastroparesis   Chronic pain  I have reviewed this patient in the [place] Controlled Substances Reporting System.  He is receiving medications from only one provider and appears to be taking them as prescribed. He is not at particularly high risk of opioid misuse, diversion, or overdose.  Continue Oxy 15 mg 5x daily prn without escalation   Recent traumatic eye injury Severe corneal abrasion Reports that it is healing well based on eye appointment yesterday Continue ofloxacin   Tobacco dependence Encourage cessation, particularly given prior transplant This was discussed with the patient and should be reviewed on  an ongoing basis.   Patch ordered at time of presentation   Marijuana abuse Patient denied use of drugs at the time of visit but UDS + for [facility] Cessation should be encouraged      Subjective: Reports feeling better today. Eager to go home soon  Physical Exam: Vitals:   07/04/23 2021 07/05/23 0452 07/05/23 0809 07/05/23 1612  BP: 125/76 (!) 160/69 (!) 165/78 (!) 178/89  Pulse: 77 72 73 79  Resp: 20 16 18 18   Temp: 97.9 F (36.6 C) 98.6 F (37 C)  98.2 F (36.8 C)  TempSrc:  Oral Oral Oral  SpO2: 100% 97% 94% 98%   General exam: Awake, laying in bed, in nad Respiratory system: Normal respiratory effort, no wheezing Cardiovascular system: regular rate, s1, s2 Gastrointestinal system: Soft, nondistended, positive BS Central nervous system: CN2-12 grossly intact, strength intact Extremities: Perfused, no clubbing Skin: Normal skin turgor, no notable skin lesions seen Psychiatry: Mood normal // no visual hallucinations   Data Reviewed:  Labs reviewed: Na 138, K 3.6, Cr 1.48, WBC 8.6, Hgb 14.9  Family Communication: pt in room, family at bedside  Disposition: Status is: Observation The patient remains OBS appropriate and will d/c before 2 midnights.  Planned Discharge Destination: Home     Author: Rickey Barbara, MD 07/05/2023 5:02 PM  For on call review [email or web address].

## 2023-07-05 NOTE — Care Management Obs Status (Signed)
MEDICARE OBSERVATION STATUS NOTIFICATION   Patient Details  Name: [name] MRN: [id number] Date of Birth: 06/27/1976   Medicare Observation Status Notification Given:  Yes    Harriet Masson, RN 07/05/2023, 10:24 AM

## 2023-07-05 NOTE — Progress Notes (Signed)
TRH night cross cover note:   I was notified by RN of the patient's most recent manual blood pressure, which reflects systolic pressure of 210, heart rates in the 70s.  No new symptoms reported at this time.  The patient has just received a scheduled dose of oral hydralazine.  RN to recheck vital signs in 30 minutes.  I did subsequently modified existing order for prn IV hydralazine to reflect hydralazine 10 mg IV every 4 hours as needed for systolic blood pressure greater than 180 mmHg.  The patient is also requesting a prn medication for heartburn.  I subsequently placed order for prn Tums for heartburn.     Newton Pigg, DO Hospitalist

## 2023-07-05 NOTE — Plan of Care (Signed)

## 2023-07-06 ENCOUNTER — Other Ambulatory Visit (HOSPITAL_COMMUNITY): Payer: Self-pay

## 2023-07-06 DIAGNOSIS — D849 Immunodeficiency, unspecified: Secondary | ICD-10-CM | POA: Diagnosis not present

## 2023-07-06 DIAGNOSIS — R509 Fever, unspecified: Secondary | ICD-10-CM | POA: Diagnosis not present

## 2023-07-06 LAB — COMPREHENSIVE METABOLIC PANEL
ALT: UNDETERMINED U/L (ref 0–44)
AST: UNDETERMINED U/L (ref 15–41)
Albumin: 2.6 g/dL — ABNORMAL LOW (ref 3.5–5.0)
Alkaline Phosphatase: 85 U/L (ref 38–126)
Anion gap: 9 (ref 5–15)
BUN: 16 mg/dL (ref 6–20)
CO2: 19 mmol/L — ABNORMAL LOW (ref 22–32)
Calcium: 8.6 mg/dL — ABNORMAL LOW (ref 8.9–10.3)
Chloride: 108 mmol/L (ref 98–111)
Creatinine, Ser: 1.38 mg/dL — ABNORMAL HIGH (ref 0.61–1.24)
GFR, Estimated: >60 mL/min (ref >60)
Glucose, Bld: 120 mg/dL — ABNORMAL HIGH (ref 70–99)
Potassium: 3.5 mmol/L (ref 3.5–5.1)
Sodium: 136 mmol/L (ref 135–145)
Total Bilirubin: UNDETERMINED mg/dL (ref 0.3–1.2)
Total Protein: 5.8 g/dL — ABNORMAL LOW (ref 6.5–8.1)

## 2023-07-06 LAB — CBC
HCT: 46.2 % (ref 39.0–52.0)
Hemoglobin: 14.7 g/dL (ref 13.0–17.0)
MCH: 30.1 pg (ref 26.0–34.0)
MCHC: 31.8 g/dL (ref 30.0–36.0)
MCV: 94.7 fL (ref 80.0–100.0)
Platelets: 260 10*3/uL (ref 150–400)
RBC: 4.88 MIL/uL (ref 4.22–5.81)
RDW: 14.1 % (ref 11.5–15.5)
WBC: 7.1 10*3/uL (ref 4.0–10.5)
nRBC: 0 % (ref 0.0–0.2)

## 2023-07-06 LAB — URINE CULTURE

## 2023-07-06 MED ORDER — CEFDINIR 300 MG PO CAPS
300.0000 mg | ORAL_CAPSULE | Freq: Two times a day (BID) | ORAL | 0 refills | Status: AC
  Filled 2023-07-06: qty 8, 4d supply, fill #0

## 2023-07-06 NOTE — Progress Notes (Addendum)
@  2130 Pt's manual bp 220/90, other VS WNL. Pt was given scheduled oral hydralazine. Pt complained of indigestion. @2200  Dr. Arlean Hopping notified per above. PRN hydralazine was modified and PRN Tums ordered. @2230  CA rechecked bp 161/74, no prn med needed. Pt declined Tums. Dr. notified of this. No new orders at this time.

## 2023-07-06 NOTE — Plan of Care (Signed)
  Problem: Education: Goal: Knowledge of General Education information will improve Description Including pain rating scale, medication(s)/side effects and non-pharmacologic comfort measures Outcome: Progressing   

## 2023-07-06 NOTE — Discharge Summary (Signed)
Physician Discharge Summary   Patient: [name] MRN: [id number] DOB: 11-02-1976  Admit date:     07/04/2023  Discharge date: 07/06/23  Discharge Physician: Rickey Barbara   PCP: [name], MD   Recommendations at discharge:   Follow up with PCP in 1-2 weeks Follow up with transplant team as scheduled  Discharge Diagnoses: Principal Problem:   Fever Active Problems:   Dyslipidemia, goal LDL below 100   S/P bilateral BKA (below knee amputation) (HCC)   Renal transplant, status post   History of pancreas transplant (HCC)   Diabetic gastroparesis associated with type 1 diabetes mellitus (HCC)   Hypertension   Chronic pain   Tobacco dependence   Marijuana dependence (HCC)  Resolved Problems:   * No resolved hospital problems. [facility] Course: No notes on file  Assessment and Plan: Fever secondary to ecoli UTI Patient with h/o immunosuppression presenting with fever Nonspecific symptoms but he has had some difficulty urinating UA is suggestive of UTI, culture thus far pos for E.coli. Sensitivities pending Was continued initially on Rocephin Blood cultures neg thus far HIV non reactive -Pt reports feeling better. To transition to omnicef on d/c   H/o renal/pancreas transplant Remote (10 years) On immunosuppressants - prednisone, belatacept, mycophenolate Continue Bactrim   PVD s/p B BKA Stumps appear C/D/I, unlikely related to current presentation   HTN Continue hydralazine per home regimen   DM Now controlled off medications, A1c 6.0 Continue Reglan for gastroparesis   Chronic pain  I have reviewed this patient in the [place] Controlled Substances Reporting System.  He is receiving medications from only one provider and appears to be taking them as prescribed. He is not at particularly high risk of opioid misuse, diversion, or overdose.  Continue Oxy 15 mg 5x daily prn without escalation   Recent traumatic eye injury Severe corneal  abrasion Reports that it is healing well based on eye appointment yesterday Continue ofloxacin   Tobacco dependence Encourage cessation, particularly given prior transplant This was discussed with the patient and should be reviewed on an ongoing basis.   Patch ordered at time of presentation   Marijuana abuse Patient denied use of drugs at the time of visit but UDS + for [facility] Cessation should be encouraged       Consultants:  Procedures performed:   Disposition: Home Diet recommendation:  Carb modified diet DISCHARGE MEDICATION: Allergies as of 07/06/2023       Reactions   Amlodipine Rash   Lisinopril Rash        Medication List     TAKE these medications    belatacept 250 MG Solr injection Commonly known as: NULOJIX Inject into the vein every 30 (thirty) days.   cefdinir 300 MG capsule Commonly known as: OMNICEF Take 1 capsule (300 mg total) by mouth 2 (two) times daily for 4 days.   ergocalciferol 1.25 MG (50000 UT) capsule Commonly known as: VITAMIN D2 Take 1 capsule (50,000 Units total) by mouth once a week. What changed: additional instructions   fluocinonide cream 0.05 % Commonly known as: LIDEX Apply one application to affected areas twice daily   hydrALAZINE 50 MG tablet Commonly known as: APRESOLINE Take 1 tablet (50 mg total) by mouth 3 (three) times daily. What changed:  how much to take when to take this   metoCLOPramide 10 MG tablet Commonly known as: Reglan Take 1 tablet (10 mg total) by mouth 4 (four) times daily. Take 1tab 20-30 minutes before breakfast, lunch, dinner  and at bedtime   mycophenolate 180 MG EC tablet Commonly known as: MYFORTIC Take 360 mg by mouth 2 (two) times daily.   ofloxacin 0.3 % ophthalmic solution Commonly known as: OCUFLOX Place 1 drop into the left eye 4 (four) times daily.   omeprazole 40 MG capsule Commonly known as: PRILOSEC Take 40 mg by mouth daily.   oxyCODONE 15 MG immediate release  tablet Commonly known as: ROXICODONE Take 1 tablet (15 mg total) by mouth 5 (five) times daily as needed. What changed: reasons to take this   predniSONE 5 MG tablet Commonly known as: DELTASONE Take 5 mg by mouth daily.   sulfamethoxazole-trimethoprim 400-80 MG tablet Commonly known as: BACTRIM Take 1 tablet by mouth every Monday, Wednesday, and Friday.        Follow-up Information     [name], MD Follow up in 2 week(s).   Specialty: Internal Medicine Why: Hospital follow up Contact information: [address] [place] [address] [phone]                Discharge Exam: There were no vitals filed for this visit. General exam: Awake, laying in bed, in nad Respiratory system: Normal respiratory effort, no wheezing Cardiovascular system: regular rate, s1, s2 Gastrointestinal system: Soft, nondistended, positive BS Central nervous system: CN2-12 grossly intact, strength intact Extremities: Perfused, no clubbing Skin: Normal skin turgor, no notable skin lesions seen Psychiatry: Mood normal // no visual hallucinations   Condition at discharge: fair  The results of significant diagnostics from this hospitalization (including imaging, microbiology, ancillary and laboratory) are listed below for reference.   Imaging Studies: DG Chest 2 View  Result Date: 07/04/2023 CLINICAL DATA:  Cough and congestion. EXAM: CHEST - 2 VIEW COMPARISON:  June 02, 2023 FINDINGS: The heart size and mediastinal contours are within normal limits. There is no evidence of acute infiltrate, pleural effusion or pneumothorax. The visualized skeletal structures are unremarkable. IMPRESSION: No active cardiopulmonary disease. Electronically Signed   By: Aram Candela M.D.   On: 07/04/2023 03:02   CT Head Wo Contrast  Result Date: 06/25/2023 CLINICAL DATA:  Trauma EXAM: CT HEAD WITHOUT CONTRAST CT MAXILLOFACIAL WITHOUT CONTRAST TECHNIQUE: Multidetector CT imaging of the head and  maxillofacial structures were performed using the standard protocol without intravenous contrast. Multiplanar CT image reconstructions of the maxillofacial structures were also generated. RADIATION DOSE REDUCTION: This exam was performed according to the departmental dose-optimization program which includes automated exposure control, adjustment of the mA and/or kV according to patient size and/or use of iterative reconstruction technique. COMPARISON:  12/29/2016 FINDINGS: CT HEAD FINDINGS Brain: No evidence of acute infarction, hemorrhage, hydrocephalus, extra-axial collection or mass lesion/mass effect. Vascular: No hyperdense vessel or unexpected calcification. CT FACIAL BONES FINDINGS Skull: Normal. Negative for fracture or focal lesion. Facial bones: No displaced fractures or dislocations. Plate and screw fixation of the mandibular body Sinuses/Orbits: No acute finding. Other: Vascular calcinosis. IMPRESSION: 1. No acute intracranial pathology. 2. No displaced fractures or dislocations of the facial bones. 3. Plate and screw fixation of the mandibular body. Electronically Signed   By: Jearld Lesch M.D.   On: 06/25/2023 20:48   CT Maxillofacial WO CM  Result Date: 06/25/2023 CLINICAL DATA:  Trauma EXAM: CT HEAD WITHOUT CONTRAST CT MAXILLOFACIAL WITHOUT CONTRAST TECHNIQUE: Multidetector CT imaging of the head and maxillofacial structures were performed using the standard protocol without intravenous contrast. Multiplanar CT image reconstructions of the maxillofacial structures were also generated. RADIATION DOSE REDUCTION: This exam was  performed according to the departmental dose-optimization program which includes automated exposure control, adjustment of the mA and/or kV according to patient size and/or use of iterative reconstruction technique. COMPARISON:  12/29/2016 FINDINGS: CT HEAD FINDINGS Brain: No evidence of acute infarction, hemorrhage, hydrocephalus, extra-axial collection or mass lesion/mass  effect. Vascular: No hyperdense vessel or unexpected calcification. CT FACIAL BONES FINDINGS Skull: Normal. Negative for fracture or focal lesion. Facial bones: No displaced fractures or dislocations. Plate and screw fixation of the mandibular body Sinuses/Orbits: No acute finding. Other: Vascular calcinosis. IMPRESSION: 1. No acute intracranial pathology. 2. No displaced fractures or dislocations of the facial bones. 3. Plate and screw fixation of the mandibular body. Electronically Signed   By: Jearld Lesch M.D.   On: 06/25/2023 20:48   DG Sacrum/Coccyx  Result Date: 06/25/2023 CLINICAL DATA:  Fall, sacrococcygeal pain EXAM: SACRUM AND COCCYX - 2+ VIEW COMPARISON:  None Available. FINDINGS: There is no evidence of fracture or other focal bone lesions. IMPRESSION: Negative. Electronically Signed   By: Helyn Numbers M.D.   On: 06/25/2023 20:40   DG Lumbar Spine Complete  Result Date: 06/25/2023 CLINICAL DATA:  Fall, back pain EXAM: LUMBAR SPINE - COMPLETE 4+ VIEW COMPARISON:  None Available. FINDINGS: There is no evidence of lumbar spine fracture. Alignment is normal. Intervertebral disc spaces are maintained. IMPRESSION: Negative. Electronically Signed   By: Helyn Numbers M.D.   On: 06/25/2023 20:36    Microbiology: Results for orders placed or performed during the hospital encounter of 07/04/23  Resp panel by RT-PCR (RSV, Flu A&B, Covid) Anterior Nasal Swab     Status: None   Collection Time: 07/04/23  2:30 AM   Specimen: Anterior Nasal Swab  Result Value Ref Range Status   SARS Coronavirus 2 by RT PCR NEGATIVE NEGATIVE Final   Influenza A by PCR NEGATIVE NEGATIVE Final   Influenza B by PCR NEGATIVE NEGATIVE Final    Comment: (NOTE) The Xpert Xpress SARS-CoV-2/FLU/RSV plus assay is intended as an aid in the diagnosis of influenza from Nasopharyngeal swab specimens and should not be used as a sole basis for treatment. Nasal washings and aspirates are unacceptable for Xpert Xpress  SARS-CoV-2/FLU/RSV testing.  Fact Sheet for Patients: BloggerCourse.com  Fact Sheet for Healthcare Providers: SeriousBroker.it  This test is not yet approved or cleared by the [place] FDA and has been authorized for detection and/or diagnosis of SARS-CoV-2 by FDA under an Emergency Use Authorization (EUA). This EUA will remain in effect (meaning this test can be used) for the duration of the COVID-19 declaration under Section 564(b)(1) of the Act, 21 U.S.C. section 360bbb-3(b)(1), unless the authorization is terminated or revoked.     Resp Syncytial Virus by PCR NEGATIVE NEGATIVE Final    Comment: (NOTE) Fact Sheet for Patients: BloggerCourse.com  Fact Sheet for Healthcare Providers: SeriousBroker.it  This test is not yet approved or cleared by the [place] FDA and has been authorized for detection and/or diagnosis of SARS-CoV-2 by FDA under an Emergency Use Authorization (EUA). This EUA will remain in effect (meaning this test can be used) for the duration of the COVID-19 declaration under Section 564(b)(1) of the Act, 21 U.S.C. section 360bbb-3(b)(1), unless the authorization is terminated or revoked.  Performed at [facility], [address] [place], [place] [address]   Respiratory (~20 pathogens) panel by PCR     Status: None   Collection Time: 07/04/23  2:30 AM   Specimen: Nasopharyngeal Swab; Respiratory  Result Value Ref Range Status  Adenovirus NOT DETECTED NOT DETECTED Final   Coronavirus 229E NOT DETECTED NOT DETECTED Final    Comment: (NOTE) The Coronavirus on the Respiratory Panel, DOES NOT test for the novel  Coronavirus (2019 nCoV)    Coronavirus HKU1 NOT DETECTED NOT DETECTED Final   Coronavirus NL63 NOT DETECTED NOT DETECTED Final   Coronavirus OC43 NOT DETECTED NOT DETECTED Final   Metapneumovirus NOT DETECTED NOT DETECTED Final    Rhinovirus / Enterovirus NOT DETECTED NOT DETECTED Final   Influenza A NOT DETECTED NOT DETECTED Final   Influenza B NOT DETECTED NOT DETECTED Final   Parainfluenza Virus 1 NOT DETECTED NOT DETECTED Final   Parainfluenza Virus 2 NOT DETECTED NOT DETECTED Final   Parainfluenza Virus 3 NOT DETECTED NOT DETECTED Final   Parainfluenza Virus 4 NOT DETECTED NOT DETECTED Final   Respiratory Syncytial Virus NOT DETECTED NOT DETECTED Final   Bordetella pertussis NOT DETECTED NOT DETECTED Final   Bordetella Parapertussis NOT DETECTED NOT DETECTED Final   Chlamydophila pneumoniae NOT DETECTED NOT DETECTED Final   Mycoplasma pneumoniae NOT DETECTED NOT DETECTED Final    Comment: Performed at [facility], [address], [place], [place] [address]  Urine Culture     Status: Abnormal   Collection Time: 07/04/23  9:00 AM   Specimen: Urine, Random  Result Value Ref Range Status   Specimen Description URINE, RANDOM  Final   Special Requests   Final    NONE Reflexed from [id number] Performed at [facility], [address] [place], [place] [address]    Culture 10,000 COLONIES/mL ESCHERICHIA COLI (A)  Final   Report Status 07/06/2023 FINAL  Final   Organism ID, Bacteria ESCHERICHIA COLI (A)  Final      Susceptibility   Escherichia coli - MIC*    AMPICILLIN <=2 SENSITIVE Sensitive     CEFAZOLIN <=4 SENSITIVE Sensitive     CEFEPIME <=0.12 SENSITIVE Sensitive     CEFTRIAXONE <=0.25 SENSITIVE Sensitive     CIPROFLOXACIN <=0.25 SENSITIVE Sensitive     GENTAMICIN <=1 SENSITIVE Sensitive     IMIPENEM <=0.25 SENSITIVE Sensitive     NITROFURANTOIN <=16 SENSITIVE Sensitive     TRIMETH/SULFA >=320 RESISTANT Resistant     AMPICILLIN/SULBACTAM <=2 SENSITIVE Sensitive     PIP/TAZO <=4 SENSITIVE Sensitive     * 10,000 COLONIES/mL ESCHERICHIA COLI  Culture, blood (Routine x 2)     Status: None (Preliminary result)   Collection Time: 07/04/23 11:36 AM   Specimen: BLOOD LEFT HAND  Result Value Ref  Range Status   Specimen Description BLOOD LEFT HAND  Final   Special Requests   Final    BOTTLES DRAWN AEROBIC ONLY Blood Culture adequate volume   Culture   Final    NO GROWTH 2 DAYS Performed at [facility], [address], [place], [place] [address]    Report Status PENDING  Incomplete  Culture, blood (Routine x 2)     Status: None (Preliminary result)   Collection Time: 07/04/23 11:37 AM   Specimen: BLOOD RIGHT HAND  Result Value Ref Range Status   Specimen Description BLOOD RIGHT HAND  Final   Special Requests   Final    BOTTLES DRAWN AEROBIC AND ANAEROBIC Blood Culture results may not be optimal due to an inadequate volume of blood received in culture bottles   Culture   Final    NO GROWTH 2 DAYS Performed at [facility], [address], [place], [place] [address]  Report Status PENDING  Incomplete    Labs: CBC: Recent Labs  Lab 07/04/23 0222 07/05/23 0618 07/06/23 0312  WBC 10.1 8.6 7.1  NEUTROABS 7.5  --   --   HGB 17.3* 14.9 14.7  HCT 54.5* 45.8 46.2  MCV 96.5 94.6 94.7  PLT 252 253 260   Basic Metabolic Panel: Recent Labs  Lab 07/04/23 0222 07/05/23 0618 07/06/23 0312  NA 135 138 136  K 3.8 3.6 3.5  CL 103 110 108  CO2 16* 21* 19*  GLUCOSE 115* 122* 120*  BUN 16 16 16   CREATININE 1.64* 1.48* 1.38*  CALCIUM 9.0 8.6* 8.6*   Liver Function Tests: Recent Labs  Lab 07/04/23 0222 07/06/23 0312  AST 12* QUANTITY NOT SUFFICIENT, UNABLE TO PERFORM TEST  ALT 8 QUANTITY NOT SUFFICIENT, UNABLE TO PERFORM TEST  ALKPHOS 79 85  BILITOT 0.9 QUANTITY NOT SUFFICIENT, UNABLE TO PERFORM TEST  PROT 7.5 5.8*  ALBUMIN 3.2* 2.6*   CBG: No results for input(s): "GLUCAP" in the last 168 hours.  Discharge time spent: less than 30 minutes.  Signed: Rickey Barbara, MD [facility] 07/06/2023

## 2023-07-06 NOTE — TOC Transition Note (Signed)
Transition of Care [facility]) - CM/SW Discharge Note   Patient Details  Name: [name] MRN: [id number] Date of Birth: Jan 18, 1976  Transition of Care [facility]) CM/SW Contact:  Harriet Masson, RN Phone Number: 07/06/2023, 10:08 AM   Clinical Narrative:    Patient stable for discharge.  Patient to make PCP follow up apts on AVS. No TOC needs at this time.   Final next level of care: Home/Self Care Barriers to Discharge: Barriers Resolved   Patient Goals and CMS Choice     Return home Discharge Placement    home                     Discharge Plan and Services Additional resources added to the After Visit Summary for                                       Social Determinants of Health (SDOH) Interventions SDOH Screenings   Food Insecurity: No Food Insecurity (09/06/2022)  Housing: Low Risk  (09/06/2022)  Transportation Needs: No Transportation Needs (09/06/2022)  Utilities: Not At Risk (09/06/2022)  Alcohol Screen: Low Risk  (09/06/2022)  Depression (PHQ2-9): Low Risk  (09/06/2022)  Financial Resource Strain: Low Risk  (09/06/2022)  Physical Activity: Inactive (09/06/2022)  Social Connections: Socially Integrated (09/06/2022)  Stress: No Stress Concern Present (09/06/2022)  Tobacco Use: Medium Risk (07/04/2023)     Readmission Risk Interventions     No data to display

## 2023-07-06 NOTE — Plan of Care (Signed)
Patient alert/oriented X4. Patient compliant with medication administration and had a shower this AM. AVS discharge instructions explained in detail to patient and PIV removed in R forearm prior to discharge. Patient VSS. Patient belongings packed up at bedside, advised patient to not leave any jewelry behind.   Problem: Education: Goal: Knowledge of General Education information will improve Description: Including pain rating scale, medication(s)/side effects and non-pharmacologic comfort measures Outcome: Adequate for Discharge   Problem: Health Behavior/Discharge Planning: Goal: Ability to manage health-related needs will improve Outcome: Adequate for Discharge   Problem: Clinical Measurements: Goal: Ability to maintain clinical measurements within normal limits will improve Outcome: Adequate for Discharge   Problem: Clinical Measurements: Goal: Will remain free from infection Outcome: Adequate for Discharge   Problem: Clinical Measurements: Goal: Diagnostic test results will improve Outcome: Adequate for Discharge   Problem: Clinical Measurements: Goal: Respiratory complications will improve Outcome: Adequate for Discharge   Problem: Clinical Measurements: Goal: Cardiovascular complication will be avoided Outcome: Adequate for Discharge   Problem: Activity: Goal: Risk for activity intolerance will decrease Outcome: Adequate for Discharge   Problem: Nutrition: Goal: Adequate nutrition will be maintained Outcome: Adequate for Discharge   Problem: Coping: Goal: Level of anxiety will decrease Outcome: Adequate for Discharge   Problem: Elimination: Goal: Will not experience complications related to bowel motility Outcome: Adequate for Discharge   Problem: Elimination: Goal: Will not experience complications related to urinary retention Outcome: Adequate for Discharge   Problem: Pain Managment: Goal: General experience of comfort will improve Outcome: Adequate for  Discharge   Problem: Safety: Goal: Ability to remain free from injury will improve Outcome: Adequate for Discharge   Problem: Skin Integrity: Goal: Risk for impaired skin integrity will decrease Outcome: Adequate for Discharge

## 2023-07-06 NOTE — Hospital Course (Signed)
47 y.o. male with medical history significant of PVD s/p B BKA, chronic pain, DM, h/o renal/pancreas transplant and no longer on HD, and HTN presenting with fever.   He previously had a traumatic corneal abrasion and went to the eye doctor yesterday.  He felt fine going in but developed fever shortly thereafter to 101.  He had difficulty urinating and stooling, no dysuria.  + headache that is now somewhat improved.  No n/v/d, no URI symptoms.  No sick contacts.

## 2023-07-09 LAB — CULTURE, BLOOD (ROUTINE X 2)
Culture: NO GROWTH
Culture: NO GROWTH
Special Requests: ADEQUATE

## 2023-07-11 ENCOUNTER — Telehealth: Payer: Self-pay | Admitting: Internal Medicine

## 2023-07-11 NOTE — Telephone Encounter (Signed)
Called patient about Candida on biopsies.  He is actually somewhat better, he is on fluconazole, prescribed by another provider I think you saw this.  He was recently hospitalized.  Had a UTI.  He will take 2 weeks worth of fluconazole for Candida esophagitis.

## 2023-07-14 ENCOUNTER — Other Ambulatory Visit: Payer: Self-pay

## 2023-07-17 ENCOUNTER — Other Ambulatory Visit: Payer: Self-pay

## 2023-07-18 ENCOUNTER — Other Ambulatory Visit: Payer: Self-pay

## 2023-07-20 ENCOUNTER — Encounter: Payer: Self-pay | Admitting: Internal Medicine

## 2023-07-20 ENCOUNTER — Ambulatory Visit (INDEPENDENT_AMBULATORY_CARE_PROVIDER_SITE_OTHER): Payer: 59 | Admitting: Internal Medicine

## 2023-07-20 VITALS — BP 156/92 | HR 84 | Temp 98.0°F | Ht 70.0 in | Wt 195.0 lb

## 2023-07-20 DIAGNOSIS — Z125 Encounter for screening for malignant neoplasm of prostate: Secondary | ICD-10-CM | POA: Diagnosis not present

## 2023-07-20 DIAGNOSIS — E785 Hyperlipidemia, unspecified: Secondary | ICD-10-CM

## 2023-07-20 DIAGNOSIS — R9431 Abnormal electrocardiogram [ECG] [EKG]: Secondary | ICD-10-CM | POA: Diagnosis not present

## 2023-07-20 DIAGNOSIS — F1220 Cannabis dependence, uncomplicated: Secondary | ICD-10-CM

## 2023-07-20 DIAGNOSIS — B9620 Unspecified Escherichia coli [E. coli] as the cause of diseases classified elsewhere: Secondary | ICD-10-CM

## 2023-07-20 DIAGNOSIS — Z0001 Encounter for general adult medical examination with abnormal findings: Secondary | ICD-10-CM

## 2023-07-20 DIAGNOSIS — I151 Hypertension secondary to other renal disorders: Secondary | ICD-10-CM | POA: Diagnosis not present

## 2023-07-20 DIAGNOSIS — I10 Essential (primary) hypertension: Secondary | ICD-10-CM | POA: Diagnosis not present

## 2023-07-20 DIAGNOSIS — R21 Rash and other nonspecific skin eruption: Secondary | ICD-10-CM | POA: Insufficient documentation

## 2023-07-20 DIAGNOSIS — N390 Urinary tract infection, site not specified: Secondary | ICD-10-CM | POA: Insufficient documentation

## 2023-07-20 NOTE — Progress Notes (Signed)
Subjective:  Patient ID: [name], male    DOB: July 10, 1976  Age: 47 y.o. MRN: [id number]  CC: Hypertension, Annual Exam, and Hyperlipidemia   HPI [name] presents for a CPX and f/up -----  Discussed the use of AI scribe software for clinical note transcription with the patient, who gave verbal consent to proceed.  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 He reports no symptoms of elevated blood pressure such as headaches or blurred vision, and his blood pressure has been mostly normal since a reduction in antihypertensive medications. He denies any chest pain or shortness of breath, and a recent EKG was reported as normal. The patient also denies any urinary difficulties. He is under the care of a nephrologist. He was recently admitted fir urosepsis.       He is taking hydralazine 25 mg 3 times a day to control his blood pressure. Outpatient Medications Prior to Visit  Medication Sig Dispense Refill   belatacept (NULOJIX) 250 MG SOLR injection Inject into the vein every 30 (thirty) days.     ergocalciferol (VITAMIN D2) 1.25 MG (50000 UT) capsule Take 1 capsule (50,000 Units total) by mouth once a week. (Patient taking differently: Take 1 capsule by mouth once a week. Wednesday) 4 capsule 3   fluconazole (DIFLUCAN) 100 MG tablet Take 100 mg by mouth daily.     fluocinonide cream (LIDEX) 0.05 % Apply one application to affected areas twice daily 120 g 2   metoCLOPramide (REGLAN) 10 MG tablet Take 1 tablet (10 mg total) by mouth 4 (four) times daily. Take 1tab 20-30 minutes before breakfast, lunch, dinner and at bedtime 120 tablet 1   mycophenolate (MYFORTIC) 180 MG EC tablet Take 360 mg by mouth 2 (two) times daily.     ofloxacin (OCUFLOX) 0.3 % ophthalmic solution Place 1 drop into the left eye 4 (four) times daily.     omeprazole (PRILOSEC) 40 MG capsule Take 40 mg by mouth daily.     oxyCODONE (ROXICODONE) 15 MG  immediate release tablet Take 1 tablet (15 mg total) by mouth 5 (five) times daily as needed. (Patient taking differently: Take 15 mg by mouth 5 (five) times daily as needed for pain.) 150 tablet 0   predniSONE (DELTASONE) 5 MG tablet Take 5 mg by mouth daily.     sulfamethoxazole-trimethoprim (BACTRIM,SEPTRA) 400-80 MG per tablet Take 1 tablet by mouth every Monday, Wednesday, and Friday.      hydrALAZINE (APRESOLINE) 50 MG tablet Take 1 tablet (50 mg total) by mouth 3 (three) times daily. (Patient taking differently: Take 25 mg by mouth 2 (two) times daily.) 30 tablet 0   No facility-administered medications prior to visit.    ROS Review of Systems  Constitutional: Negative.  Negative for appetite change, chills, diaphoresis, fatigue and fever.  HENT: Negative.    Eyes: Negative.  Negative for visual disturbance.  Respiratory: Negative.  Negative for cough, chest tightness, shortness of breath and wheezing.   Cardiovascular:  Negative for chest pain, palpitations and leg swelling.  Gastrointestinal:  Negative for abdominal pain, blood in stool, constipation, diarrhea and vomiting.  Endocrine: Negative.   Genitourinary: Negative.  Negative for difficulty urinating, dysuria, frequency, scrotal swelling and testicular pain.  Musculoskeletal:  Positive for gait problem. Negative for arthralgias, back pain, joint swelling and myalgias.  Skin:  Positive for rash. Negative for color change.  Allergic/Immunologic: Negative.   Neurological:  Negative for dizziness, weakness, light-headedness  and headaches.  Hematological:  Negative for adenopathy. Does not bruise/bleed easily.  Psychiatric/Behavioral: Negative.      Objective:  BP (!) 156/92 (BP Location: Left Arm, Patient Position: Sitting, Cuff Size: Large)   Pulse 84   Temp 98 F (36.7 C) (Oral)   Ht 5\' 10"  (1.778 m)   Wt 195 lb (88.5 kg)   SpO2 97%   BMI 27.98 kg/m   BP Readings from Last 3 Encounters:  07/20/23 (!) 156/92   07/06/23 (!) 149/77  06/28/23 (!) 134/58    Wt Readings from Last 3 Encounters:  07/20/23 195 lb (88.5 kg)  06/28/23 193 lb (87.5 kg)  06/25/23 193 lb (87.5 kg)    Physical Exam Vitals reviewed.  Constitutional:      Appearance: Normal appearance.  HENT:     Mouth/Throat:     Mouth: Mucous membranes are moist.  Eyes:     General: No scleral icterus.    Conjunctiva/sclera: Conjunctivae normal.  Cardiovascular:     Rate and Rhythm: Normal rate and regular rhythm.     Heart sounds: No murmur heard.    No friction rub. No gallop.     Comments: EKG- NSR, 76 bpm ?LAE LAD Septal infarct pattern with TWI in V1 , V2 is new Pulmonary:     Effort: Pulmonary effort is normal.     Breath sounds: No stridor. No wheezing, rhonchi or rales.  Abdominal:     General: Abdomen is flat.     Palpations: There is no mass.     Tenderness: There is no abdominal tenderness. There is no guarding.     Hernia: No hernia is present. There is no hernia in the left inguinal area or right inguinal area.  Genitourinary:    Pubic Area: No rash.      Penis: Normal and uncircumcised. No phimosis, paraphimosis, hypospadias, erythema, tenderness, discharge, swelling or lesions.      Testes: Normal.     Epididymis:     Right: Normal.     Left: Normal.     Prostate: Normal. Not enlarged, not tender and no nodules present.     Rectum: Normal. Guaiac result negative. No mass, tenderness, anal fissure, external hemorrhoid or internal hemorrhoid. Normal anal tone.  Musculoskeletal:     Cervical back: Neck supple.     Right lower leg: No edema.     Left lower leg: No edema.  Lymphadenopathy:     Cervical: No cervical adenopathy.     Lower Body: No right inguinal adenopathy. No left inguinal adenopathy.  Skin:    General: Skin is warm and dry.     Findings: Rash present. Rash is macular. Rash is not pustular or urticarial.     Comments: Pink macules in the groin and antecubital fossa  Neurological:      General: No focal deficit present.     Mental Status: He is alert. Mental status is at baseline.  Psychiatric:        Mood and Affect: Mood normal.        Behavior: Behavior normal.     Lab Results  Component Value Date   WBC 7.1 07/06/2023   HGB 14.7 07/06/2023   HCT 46.2 07/06/2023   PLT 260 07/06/2023   GLUCOSE 120 (H) 07/06/2023   CHOL 173 07/20/2023   TRIG 189.0 (H) 07/20/2023   HDL 45.00 07/20/2023   LDLDIRECT 116 (H) 01/21/2011   LDLCALC 90 07/20/2023   ALT 11 07/20/2023   AST 11  07/20/2023   NA 136 07/06/2023   K 3.5 07/06/2023   CL 108 07/06/2023   CREATININE 1.38 (H) 07/06/2023   BUN 16 07/06/2023   CO2 19 (L) 07/06/2023   TSH 1.04 07/20/2023   PSA 4.76 (H) 07/20/2023   INR 1.1 07/04/2023   HGBA1C 5.9 05/18/2023   MICROALBUR 0.7 08/26/2016    DG Chest 2 View  Result Date: 07/04/2023 CLINICAL DATA:  Cough and congestion. EXAM: CHEST - 2 VIEW COMPARISON:  June 02, 2023 FINDINGS: The heart size and mediastinal contours are within normal limits. There is no evidence of acute infiltrate, pleural effusion or pneumothorax. The visualized skeletal structures are unremarkable. IMPRESSION: No active cardiopulmonary disease. Electronically Signed   By: Aram Candela M.D.   On: 07/04/2023 03:02    Assessment & Plan:   E. coli UTI (urinary tract infection)- Repeat urine culture is negative. -     CULTURE, URINE COMPREHENSIVE; Future  Dyslipidemia, goal LDL below 100 - Will start a statin for risk reduction. -     Lipid panel; Future -     Lipoprotein A (LPA); Future -     TSH; Future -     Hepatic function panel; Future -     Rosuvastatin Calcium; Take 1 tablet (10 mg total) by mouth daily.  Dispense: 90 tablet; Refill: 1  Marijuana dependence (HCC) -     Urine drugs of abuse scrn w alc, routine (Ref Lab); Future  Hypertension secondary to other renal disorders- Will try get better BP control. -     EKG 12-Lead -     Urine drugs of abuse scrn w alc, routine  (Ref Lab); Future -     hydrALAZINE HCl; Take 1 tablet (50 mg total) by mouth 4 (four) times daily.  Dispense: 360 tablet; Refill: 0 -     Carvedilol; Take 1 tablet (3.125 mg total) by mouth 2 (two) times daily with a meal.  Dispense: 180 tablet; Refill: 0  Severe uncontrolled hypertension -     Urine drugs of abuse scrn w alc, routine (Ref Lab); Future -     hydrALAZINE HCl; Take 1 tablet (50 mg total) by mouth 4 (four) times daily.  Dispense: 360 tablet; Refill: 0 -     Carvedilol; Take 1 tablet (3.125 mg total) by mouth 2 (two) times daily with a meal.  Dispense: 180 tablet; Refill: 0  Encounter for general adult medical examination with abnormal findings- Exam completed, labs reviewed, vaccines reviewed and updated, cancer screenings addressed, pt ed material was given.   Screening for prostate cancer- PSA is elevated due to recent infection. -     PSA; Future  Rash -     RPR; Future  Abnormal electrocardiogram (ECG) (EKG) -     ECHOCARDIOGRAM COMPLETE; Future  Other orders -     Opiates Confirmation, Urine     Follow-up: Return in about 6 months (around 01/20/2024).  Sanda Linger, MD

## 2023-07-20 NOTE — Patient Instructions (Signed)
Health Maintenance, Male Adopting a healthy lifestyle and getting preventive care are important in promoting health and wellness. Ask your health care provider about: The right schedule for you to have regular tests and exams. Things you can do on your own to prevent diseases and keep yourself healthy. What should I know about diet, weight, and exercise? Eat a healthy diet  Eat a diet that includes plenty of vegetables, fruits, low-fat dairy products, and lean protein. Do not eat a lot of foods that are high in solid fats, added sugars, or sodium. Maintain a healthy weight Body mass index (BMI) is a measurement that can be used to identify possible weight problems. It estimates body fat based on height and weight. Your health care provider can help determine your BMI and help you achieve or maintain a healthy weight. Get regular exercise Get regular exercise. This is one of the most important things you can do for your health. Most adults should: Exercise for at least 150 minutes each week. The exercise should increase your heart rate and make you sweat (moderate-intensity exercise). Do strengthening exercises at least twice a week. This is in addition to the moderate-intensity exercise. Spend less time sitting. Even light physical activity can be beneficial. Watch cholesterol and blood lipids Have your blood tested for lipids and cholesterol at 49 years of age, then have this test every 5 years. You may need to have your cholesterol levels checked more often if: Your lipid or cholesterol levels are high. You are older than 49 years of age. You are at high risk for heart disease. What should I know about cancer screening? Many types of cancers can be detected early and may often be prevented. Depending on your health history and family history, you may need to have cancer screening at various ages. This may include screening for: Colorectal cancer. Prostate cancer. Skin cancer. Lung  cancer. What should I know about heart disease, diabetes, and high blood pressure? Blood pressure and heart disease High blood pressure causes heart disease and increases the risk of stroke. This is more likely to develop in people who have high blood pressure readings or are overweight. Talk with your health care provider about your target blood pressure readings. Have your blood pressure checked: Every 3-5 years if you are 18-39 years of age. Every year if you are 40 years old or older. If you are between the ages of 65 and 75 and are a current or former smoker, ask your health care provider if you should have a one-time screening for abdominal aortic aneurysm (AAA). Diabetes Have regular diabetes screenings. This checks your fasting blood sugar level. Have the screening done: Once every three years after age 45 if you are at a normal weight and have a low risk for diabetes. More often and at a younger age if you are overweight or have a high risk for diabetes. What should I know about preventing infection? Hepatitis B If you have a higher risk for hepatitis B, you should be screened for this virus. Talk with your health care provider to find out if you are at risk for hepatitis B infection. Hepatitis C Blood testing is recommended for: Everyone born from 1945 through 1965. Anyone with known risk factors for hepatitis C. Sexually transmitted infections (STIs) You should be screened each year for STIs, including gonorrhea and chlamydia, if: You are sexually active and are younger than 49 years of age. You are older than 49 years of age and your   health care provider tells you that you are at risk for this type of infection. Your sexual activity has changed since you were last screened, and you are at increased risk for chlamydia or gonorrhea. Ask your health care provider if you are at risk. Ask your health care provider about whether you are at high risk for HIV. Your health care provider  may recommend a prescription medicine to help prevent HIV infection. If you choose to take medicine to prevent HIV, you should first get tested for HIV. You should then be tested every 3 months for as long as you are taking the medicine. Follow these instructions at home: Alcohol use Do not drink alcohol if your health care provider tells you not to drink. If you drink alcohol: Limit how much you have to 0-2 drinks a day. Know how much alcohol is in your drink. In the [place] one drink equals one 12 oz bottle of beer (355 mL), one 5 oz glass of wine (148 mL), or one 1 oz glass of hard liquor (44 mL). Lifestyle Do not use any products that contain nicotine or tobacco. These products include cigarettes, chewing tobacco, and vaping devices, such as e-cigarettes. If you need help quitting, ask your health care provider. Do not use street drugs. Do not share needles. Ask your health care provider for help if you need support or information about quitting drugs. General instructions Schedule regular health, dental, and eye exams. Stay current with your vaccines. Tell your health care provider if: You often feel depressed. You have ever been abused or do not feel safe at home. Summary Adopting a healthy lifestyle and getting preventive care are important in promoting health and wellness. Follow your health care provider's instructions about healthy diet, exercising, and getting tested or screened for diseases. Follow your health care provider's instructions on monitoring your cholesterol and blood pressure. This information is not intended to replace advice given to you by your health care provider. Make sure you discuss any questions you have with your health care provider. Document Revised: 05/03/2021 Document Reviewed: 05/03/2021 Elsevier Patient Education  2024 [employer]  

## 2023-07-21 ENCOUNTER — Other Ambulatory Visit: Payer: Self-pay

## 2023-07-21 DIAGNOSIS — Z79899 Other long term (current) drug therapy: Secondary | ICD-10-CM | POA: Diagnosis not present

## 2023-07-21 DIAGNOSIS — G546 Phantom limb syndrome with pain: Secondary | ICD-10-CM | POA: Diagnosis not present

## 2023-07-21 DIAGNOSIS — G894 Chronic pain syndrome: Secondary | ICD-10-CM | POA: Diagnosis not present

## 2023-07-21 DIAGNOSIS — E559 Vitamin D deficiency, unspecified: Secondary | ICD-10-CM | POA: Diagnosis not present

## 2023-07-21 DIAGNOSIS — Z9181 History of falling: Secondary | ICD-10-CM | POA: Diagnosis not present

## 2023-07-21 DIAGNOSIS — R030 Elevated blood-pressure reading, without diagnosis of hypertension: Secondary | ICD-10-CM | POA: Diagnosis not present

## 2023-07-21 MED ORDER — OXYCODONE HCL 15 MG PO TABS
15.0000 mg | ORAL_TABLET | Freq: Every day | ORAL | 0 refills | Status: DC | PRN
  Filled 2023-07-21: qty 150, 30d supply, fill #0

## 2023-07-21 MED ORDER — VITAMIN D2 50 MCG (2000 UT) PO TABS: 1.0000 | ORAL_TABLET | Freq: Every day | ORAL | 3 refills | Status: DC

## 2023-07-22 MED ORDER — HYDRALAZINE HCL 50 MG PO TABS
50.0000 mg | ORAL_TABLET | Freq: Four times a day (QID) | ORAL | 0 refills | Status: DC
Start: 2023-07-22 — End: 2023-09-28

## 2023-07-22 MED ORDER — CARVEDILOL 3.125 MG PO TABS: 3.1250 mg | ORAL_TABLET | Freq: Two times a day (BID) | ORAL | 0 refills | Status: DC

## 2023-07-26 MED ORDER — ROSUVASTATIN CALCIUM 10 MG PO TABS: 10.0000 mg | ORAL_TABLET | Freq: Every day | ORAL | 1 refills | Status: DC

## 2023-07-27 ENCOUNTER — Telehealth: Payer: Self-pay | Admitting: Internal Medicine

## 2023-07-27 NOTE — Telephone Encounter (Signed)
Patient needs to know if he can go back to the infusion center. Please call patient and let him know.  Orders will need to be placed Phone:  [phone]

## 2023-07-28 NOTE — Telephone Encounter (Signed)
Called pt no answer LMOM RTC need more information.Marland KitchenRaechel Caldwell

## 2023-07-28 NOTE — Telephone Encounter (Signed)
Pt return call back he states he has to get approval from PCP that he is able to return back for infusion. He states he was hospitalize on 07/04/23...Raechel Chute

## 2023-07-29 ENCOUNTER — Other Ambulatory Visit: Payer: Self-pay | Admitting: Internal Medicine

## 2023-07-29 ENCOUNTER — Encounter: Payer: Self-pay | Admitting: Internal Medicine

## 2023-07-31 NOTE — Telephone Encounter (Signed)
Notified pt letter ready for pick-up..lmb 

## 2023-08-01 NOTE — Telephone Encounter (Signed)
Patient picked up letter.

## 2023-08-10 ENCOUNTER — Encounter (INDEPENDENT_AMBULATORY_CARE_PROVIDER_SITE_OTHER): Payer: Self-pay

## 2023-08-15 DIAGNOSIS — R8271 Bacteriuria: Secondary | ICD-10-CM | POA: Diagnosis not present

## 2023-08-18 ENCOUNTER — Other Ambulatory Visit: Payer: Self-pay

## 2023-08-18 DIAGNOSIS — E559 Vitamin D deficiency, unspecified: Secondary | ICD-10-CM | POA: Diagnosis not present

## 2023-08-18 DIAGNOSIS — Z9181 History of falling: Secondary | ICD-10-CM | POA: Diagnosis not present

## 2023-08-18 DIAGNOSIS — Z79899 Other long term (current) drug therapy: Secondary | ICD-10-CM | POA: Diagnosis not present

## 2023-08-18 DIAGNOSIS — G546 Phantom limb syndrome with pain: Secondary | ICD-10-CM | POA: Diagnosis not present

## 2023-08-18 DIAGNOSIS — G894 Chronic pain syndrome: Secondary | ICD-10-CM | POA: Diagnosis not present

## 2023-08-18 MED ORDER — OXYCODONE HCL 15 MG PO TABS
15.0000 mg | ORAL_TABLET | Freq: Every day | ORAL | 0 refills | Status: DC | PRN
Start: 2023-08-18 — End: 2023-09-04
  Filled 2023-08-18: qty 150, 30d supply, fill #0

## 2023-08-18 MED ORDER — VITAMIN D2 50 MCG (2000 UT) PO TABS: 2000.0000 [IU] | ORAL_TABLET | Freq: Every day | ORAL | 3 refills | Status: DC

## 2023-08-24 ENCOUNTER — Ambulatory Visit: Payer: 59 | Admitting: Nurse Practitioner

## 2023-08-31 ENCOUNTER — Ambulatory Visit (HOSPITAL_COMMUNITY): Payer: 59

## 2023-09-03 ENCOUNTER — Encounter (HOSPITAL_COMMUNITY): Payer: Self-pay | Admitting: *Deleted

## 2023-09-03 ENCOUNTER — Other Ambulatory Visit: Payer: Self-pay

## 2023-09-03 ENCOUNTER — Inpatient Hospital Stay (HOSPITAL_COMMUNITY)
Admission: EM | Admit: 2023-09-03 | Discharge: 2023-09-06 | DRG: 690 | Disposition: A | Discharge status: Home / Self Care | Payer: 59 | Attending: Internal Medicine | Admitting: Internal Medicine

## 2023-09-03 DIAGNOSIS — G8929 Other chronic pain: Secondary | ICD-10-CM | POA: Diagnosis not present

## 2023-09-03 DIAGNOSIS — Z79899 Other long term (current) drug therapy: Secondary | ICD-10-CM | POA: Diagnosis not present

## 2023-09-03 DIAGNOSIS — G894 Chronic pain syndrome: Secondary | ICD-10-CM | POA: Diagnosis not present

## 2023-09-03 DIAGNOSIS — R270 Ataxia, unspecified: Secondary | ICD-10-CM | POA: Diagnosis present

## 2023-09-03 DIAGNOSIS — N390 Urinary tract infection, site not specified: Secondary | ICD-10-CM | POA: Diagnosis present

## 2023-09-03 DIAGNOSIS — F32A Depression, unspecified: Secondary | ICD-10-CM | POA: Diagnosis present

## 2023-09-03 DIAGNOSIS — Z940 Kidney transplant status: Secondary | ICD-10-CM

## 2023-09-03 DIAGNOSIS — Z89511 Acquired absence of right leg below knee: Secondary | ICD-10-CM | POA: Diagnosis not present

## 2023-09-03 DIAGNOSIS — N3000 Acute cystitis without hematuria: Secondary | ICD-10-CM | POA: Diagnosis not present

## 2023-09-03 DIAGNOSIS — N189 Chronic kidney disease, unspecified: Secondary | ICD-10-CM | POA: Diagnosis not present

## 2023-09-03 DIAGNOSIS — Z841 Family history of disorders of kidney and ureter: Secondary | ICD-10-CM

## 2023-09-03 DIAGNOSIS — Z833 Family history of diabetes mellitus: Secondary | ICD-10-CM

## 2023-09-03 DIAGNOSIS — Z89512 Acquired absence of left leg below knee: Secondary | ICD-10-CM | POA: Diagnosis not present

## 2023-09-03 DIAGNOSIS — E1122 Type 2 diabetes mellitus with diabetic chronic kidney disease: Secondary | ICD-10-CM | POA: Diagnosis present

## 2023-09-03 DIAGNOSIS — E8721 Acute metabolic acidosis: Secondary | ICD-10-CM | POA: Diagnosis not present

## 2023-09-03 DIAGNOSIS — M19041 Primary osteoarthritis, right hand: Secondary | ICD-10-CM | POA: Diagnosis present

## 2023-09-03 DIAGNOSIS — E875 Hyperkalemia: Secondary | ICD-10-CM | POA: Diagnosis present

## 2023-09-03 DIAGNOSIS — N1831 Chronic kidney disease, stage 3a: Secondary | ICD-10-CM | POA: Diagnosis present

## 2023-09-03 DIAGNOSIS — Z992 Dependence on renal dialysis: Secondary | ICD-10-CM | POA: Diagnosis not present

## 2023-09-03 DIAGNOSIS — Z8249 Family history of ischemic heart disease and other diseases of the circulatory system: Secondary | ICD-10-CM

## 2023-09-03 DIAGNOSIS — Z7969 Long term (current) use of other immunomodulators and immunosuppressants: Secondary | ICD-10-CM

## 2023-09-03 DIAGNOSIS — I739 Peripheral vascular disease, unspecified: Secondary | ICD-10-CM | POA: Diagnosis present

## 2023-09-03 DIAGNOSIS — E1151 Type 2 diabetes mellitus with diabetic peripheral angiopathy without gangrene: Secondary | ICD-10-CM | POA: Diagnosis present

## 2023-09-03 DIAGNOSIS — M19042 Primary osteoarthritis, left hand: Secondary | ICD-10-CM | POA: Diagnosis present

## 2023-09-03 DIAGNOSIS — Z888 Allergy status to other drugs, medicaments and biological substances status: Secondary | ICD-10-CM

## 2023-09-03 DIAGNOSIS — K3184 Gastroparesis: Secondary | ICD-10-CM | POA: Diagnosis present

## 2023-09-03 DIAGNOSIS — E1143 Type 2 diabetes mellitus with diabetic autonomic (poly)neuropathy: Secondary | ICD-10-CM | POA: Diagnosis present

## 2023-09-03 DIAGNOSIS — E785 Hyperlipidemia, unspecified: Secondary | ICD-10-CM | POA: Diagnosis not present

## 2023-09-03 DIAGNOSIS — T8619 Other complication of kidney transplant: Secondary | ICD-10-CM | POA: Diagnosis present

## 2023-09-03 DIAGNOSIS — I160 Hypertensive urgency: Secondary | ICD-10-CM | POA: Diagnosis present

## 2023-09-03 DIAGNOSIS — Z87891 Personal history of nicotine dependence: Secondary | ICD-10-CM | POA: Diagnosis not present

## 2023-09-03 DIAGNOSIS — E118 Type 2 diabetes mellitus with unspecified complications: Secondary | ICD-10-CM | POA: Diagnosis not present

## 2023-09-03 DIAGNOSIS — B9620 Unspecified Escherichia coli [E. coli] as the cause of diseases classified elsewhere: Secondary | ICD-10-CM | POA: Diagnosis present

## 2023-09-03 DIAGNOSIS — D84821 Immunodeficiency due to drugs: Secondary | ICD-10-CM | POA: Diagnosis not present

## 2023-09-03 DIAGNOSIS — K219 Gastro-esophageal reflux disease without esophagitis: Secondary | ICD-10-CM | POA: Diagnosis present

## 2023-09-03 DIAGNOSIS — E119 Type 2 diabetes mellitus without complications: Secondary | ICD-10-CM

## 2023-09-03 DIAGNOSIS — R42 Dizziness and giddiness: Secondary | ICD-10-CM | POA: Diagnosis not present

## 2023-09-03 DIAGNOSIS — E872 Acidosis, unspecified: Secondary | ICD-10-CM | POA: Diagnosis present

## 2023-09-03 DIAGNOSIS — Z1629 Resistance to other single specified antibiotic: Secondary | ICD-10-CM | POA: Diagnosis not present

## 2023-09-03 DIAGNOSIS — Z9483 Pancreas transplant status: Secondary | ICD-10-CM | POA: Diagnosis not present

## 2023-09-03 DIAGNOSIS — I129 Hypertensive chronic kidney disease with stage 1 through stage 4 chronic kidney disease, or unspecified chronic kidney disease: Secondary | ICD-10-CM | POA: Diagnosis not present

## 2023-09-03 DIAGNOSIS — N179 Acute kidney failure, unspecified: Secondary | ICD-10-CM | POA: Diagnosis present

## 2023-09-03 DIAGNOSIS — N1832 Chronic kidney disease, stage 3b: Secondary | ICD-10-CM | POA: Diagnosis not present

## 2023-09-03 DIAGNOSIS — Y830 Surgical operation with transplant of whole organ as the cause of abnormal reaction of the patient, or of later complication, without mention of misadventure at the time of the procedure: Secondary | ICD-10-CM | POA: Diagnosis present

## 2023-09-03 DIAGNOSIS — Z7952 Long term (current) use of systemic steroids: Secondary | ICD-10-CM

## 2023-09-03 DIAGNOSIS — D849 Immunodeficiency, unspecified: Secondary | ICD-10-CM | POA: Diagnosis not present

## 2023-09-03 DIAGNOSIS — Z8614 Personal history of Methicillin resistant Staphylococcus aureus infection: Secondary | ICD-10-CM

## 2023-09-03 DIAGNOSIS — Z79624 Long term (current) use of inhibitors of nucleotide synthesis: Secondary | ICD-10-CM

## 2023-09-03 LAB — CBC
HCT: 47.8 % (ref 39.0–52.0)
Hemoglobin: 15.2 g/dL (ref 13.0–17.0)
MCH: 30.7 pg (ref 26.0–34.0)
MCHC: 31.8 g/dL (ref 30.0–36.0)
MCV: 96.6 fL (ref 80.0–100.0)
Platelets: 267 10*3/uL (ref 150–400)
RBC: 4.95 MIL/uL (ref 4.22–5.81)
RDW: 15.9 % — ABNORMAL HIGH (ref 11.5–15.5)
WBC: 9.8 10*3/uL (ref 4.0–10.5)
nRBC: 0 % (ref 0.0–0.2)

## 2023-09-03 LAB — LIPASE, BLOOD: Lipase: 51 U/L (ref 11–51)

## 2023-09-03 LAB — URINALYSIS, ROUTINE W REFLEX MICROSCOPIC
Bilirubin Urine: NEGATIVE
Glucose, UA: NEGATIVE mg/dL
Ketones, ur: NEGATIVE mg/dL
Nitrite: NEGATIVE
Protein, ur: 30 mg/dL — AB
Specific Gravity, Urine: 1.005 (ref 1.005–1.030)
WBC, UA: >50 WBC/hpf (ref 0–5)
pH: 7 (ref 5.0–8.0)

## 2023-09-03 LAB — COMPREHENSIVE METABOLIC PANEL
ALT: 8 U/L (ref 0–44)
AST: 12 U/L — ABNORMAL LOW (ref 15–41)
Albumin: 2.8 g/dL — ABNORMAL LOW (ref 3.5–5.0)
Alkaline Phosphatase: 85 U/L (ref 38–126)
Anion gap: 13 (ref 5–15)
BUN: 16 mg/dL (ref 6–20)
CO2: 17 mmol/L — ABNORMAL LOW (ref 22–32)
Calcium: 8.4 mg/dL — ABNORMAL LOW (ref 8.9–10.3)
Chloride: 107 mmol/L (ref 98–111)
Creatinine, Ser: 2.11 mg/dL — ABNORMAL HIGH (ref 0.61–1.24)
GFR, Estimated: 38 mL/min — ABNORMAL LOW (ref >60)
Glucose, Bld: 163 mg/dL — ABNORMAL HIGH (ref 70–99)
Potassium: 3.6 mmol/L (ref 3.5–5.1)
Sodium: 137 mmol/L (ref 135–145)
Total Bilirubin: 0.6 mg/dL (ref 0.3–1.2)
Total Protein: 6.4 g/dL — ABNORMAL LOW (ref 6.5–8.1)

## 2023-09-03 NOTE — ED Triage Notes (Signed)
The pt reports that he has had vertigo for 2 weeks his doctor called in meclizine but that has not helped.  No nausea tired and sleeping a lot  2 weeks ago he was told that he has a small uti  and the doctor did not want to treat that at the time  the pt reports a foul odor to his urine.  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

## 2023-09-04 ENCOUNTER — Emergency Department (HOSPITAL_COMMUNITY): Payer: 59

## 2023-09-04 ENCOUNTER — Observation Stay (HOSPITAL_COMMUNITY): Payer: 59

## 2023-09-04 DIAGNOSIS — Z87891 Personal history of nicotine dependence: Secondary | ICD-10-CM | POA: Diagnosis not present

## 2023-09-04 DIAGNOSIS — N179 Acute kidney failure, unspecified: Secondary | ICD-10-CM | POA: Diagnosis present

## 2023-09-04 DIAGNOSIS — E785 Hyperlipidemia, unspecified: Secondary | ICD-10-CM

## 2023-09-04 DIAGNOSIS — Z992 Dependence on renal dialysis: Secondary | ICD-10-CM | POA: Diagnosis not present

## 2023-09-04 DIAGNOSIS — N3000 Acute cystitis without hematuria: Secondary | ICD-10-CM | POA: Diagnosis present

## 2023-09-04 DIAGNOSIS — Z89511 Acquired absence of right leg below knee: Secondary | ICD-10-CM | POA: Diagnosis not present

## 2023-09-04 DIAGNOSIS — N1831 Chronic kidney disease, stage 3a: Secondary | ICD-10-CM | POA: Diagnosis present

## 2023-09-04 DIAGNOSIS — E8721 Acute metabolic acidosis: Secondary | ICD-10-CM | POA: Diagnosis present

## 2023-09-04 DIAGNOSIS — E118 Type 2 diabetes mellitus with unspecified complications: Secondary | ICD-10-CM | POA: Diagnosis not present

## 2023-09-04 DIAGNOSIS — D849 Immunodeficiency, unspecified: Secondary | ICD-10-CM

## 2023-09-04 DIAGNOSIS — G8929 Other chronic pain: Secondary | ICD-10-CM

## 2023-09-04 DIAGNOSIS — I739 Peripheral vascular disease, unspecified: Secondary | ICD-10-CM | POA: Diagnosis present

## 2023-09-04 DIAGNOSIS — B9620 Unspecified Escherichia coli [E. coli] as the cause of diseases classified elsewhere: Secondary | ICD-10-CM | POA: Diagnosis present

## 2023-09-04 DIAGNOSIS — N189 Chronic kidney disease, unspecified: Secondary | ICD-10-CM | POA: Diagnosis present

## 2023-09-04 DIAGNOSIS — E875 Hyperkalemia: Secondary | ICD-10-CM | POA: Diagnosis present

## 2023-09-04 DIAGNOSIS — R42 Dizziness and giddiness: Secondary | ICD-10-CM

## 2023-09-04 DIAGNOSIS — K219 Gastro-esophageal reflux disease without esophagitis: Secondary | ICD-10-CM | POA: Diagnosis not present

## 2023-09-04 DIAGNOSIS — E1151 Type 2 diabetes mellitus with diabetic peripheral angiopathy without gangrene: Secondary | ICD-10-CM | POA: Diagnosis present

## 2023-09-04 DIAGNOSIS — I160 Hypertensive urgency: Secondary | ICD-10-CM | POA: Diagnosis present

## 2023-09-04 DIAGNOSIS — E119 Type 2 diabetes mellitus without complications: Secondary | ICD-10-CM

## 2023-09-04 DIAGNOSIS — T8619 Other complication of kidney transplant: Secondary | ICD-10-CM | POA: Diagnosis present

## 2023-09-04 DIAGNOSIS — E1143 Type 2 diabetes mellitus with diabetic autonomic (poly)neuropathy: Secondary | ICD-10-CM | POA: Diagnosis present

## 2023-09-04 DIAGNOSIS — Z89512 Acquired absence of left leg below knee: Secondary | ICD-10-CM | POA: Diagnosis not present

## 2023-09-04 DIAGNOSIS — N1832 Chronic kidney disease, stage 3b: Secondary | ICD-10-CM | POA: Diagnosis not present

## 2023-09-04 DIAGNOSIS — Z79899 Other long term (current) drug therapy: Secondary | ICD-10-CM | POA: Diagnosis not present

## 2023-09-04 DIAGNOSIS — Z9483 Pancreas transplant status: Secondary | ICD-10-CM | POA: Diagnosis not present

## 2023-09-04 DIAGNOSIS — Z940 Kidney transplant status: Secondary | ICD-10-CM | POA: Diagnosis not present

## 2023-09-04 DIAGNOSIS — Y830 Surgical operation with transplant of whole organ as the cause of abnormal reaction of the patient, or of later complication, without mention of misadventure at the time of the procedure: Secondary | ICD-10-CM | POA: Diagnosis present

## 2023-09-04 DIAGNOSIS — E1122 Type 2 diabetes mellitus with diabetic chronic kidney disease: Secondary | ICD-10-CM | POA: Diagnosis present

## 2023-09-04 DIAGNOSIS — N390 Urinary tract infection, site not specified: Secondary | ICD-10-CM | POA: Diagnosis not present

## 2023-09-04 DIAGNOSIS — K3184 Gastroparesis: Secondary | ICD-10-CM | POA: Diagnosis present

## 2023-09-04 DIAGNOSIS — G894 Chronic pain syndrome: Secondary | ICD-10-CM | POA: Diagnosis present

## 2023-09-04 DIAGNOSIS — Z1629 Resistance to other single specified antibiotic: Secondary | ICD-10-CM | POA: Diagnosis present

## 2023-09-04 DIAGNOSIS — F32A Depression, unspecified: Secondary | ICD-10-CM | POA: Diagnosis present

## 2023-09-04 DIAGNOSIS — I129 Hypertensive chronic kidney disease with stage 1 through stage 4 chronic kidney disease, or unspecified chronic kidney disease: Secondary | ICD-10-CM | POA: Diagnosis present

## 2023-09-04 DIAGNOSIS — D84821 Immunodeficiency due to drugs: Secondary | ICD-10-CM | POA: Diagnosis present

## 2023-09-04 LAB — I-STAT CG4 LACTIC ACID, ED: Lactic Acid, Venous: 1.1 mmol/L (ref 0.5–1.9)

## 2023-09-04 LAB — SODIUM, URINE, RANDOM: Sodium, Ur: 52 mmol/L

## 2023-09-04 LAB — CREATININE, URINE, RANDOM: Creatinine, Urine: 31 mg/dL

## 2023-09-04 MED ORDER — ALBUTEROL SULFATE (2.5 MG/3ML) 0.083% IN NEBU
2.5000 mg | INHALATION_SOLUTION | Freq: Four times a day (QID) | RESPIRATORY_TRACT | Status: DC
  Filled 2023-09-04 (×2): qty 3

## 2023-09-04 MED ORDER — ACETAMINOPHEN 325 MG PO TABS: 650.0000 mg | ORAL_TABLET | Freq: Four times a day (QID) | ORAL | Status: DC | PRN

## 2023-09-04 MED ORDER — LACTATED RINGERS IV BOLUS (SEPSIS)
1000.0000 mL | Freq: Once | INTRAVENOUS | Status: AC
  Administered 2023-09-04: 1000 mL via INTRAVENOUS

## 2023-09-04 MED ORDER — MYCOPHENOLATE SODIUM 180 MG PO TBEC
360.0000 mg | DELAYED_RELEASE_TABLET | Freq: Two times a day (BID) | ORAL | Status: DC
  Administered 2023-09-04 – 2023-09-06 (×5): 360 mg via ORAL
  Filled 2023-09-04 (×6): qty 2

## 2023-09-04 MED ORDER — SODIUM CHLORIDE 0.9% FLUSH: 3.0000 mL | Freq: Two times a day (BID) | INTRAVENOUS | Status: DC

## 2023-09-04 MED ORDER — PREDNISONE 5 MG PO TABS
5.0000 mg | ORAL_TABLET | Freq: Every day | ORAL | Status: DC
  Administered 2023-09-04 – 2023-09-06 (×3): 5 mg via ORAL
  Filled 2023-09-04 (×3): qty 1

## 2023-09-04 MED ORDER — SODIUM CHLORIDE 0.9 % IV SOLN
2.0000 g | INTRAVENOUS | Status: DC
  Administered 2023-09-05 – 2023-09-06 (×2): 2 g via INTRAVENOUS
  Filled 2023-09-04 (×2): qty 20

## 2023-09-04 MED ORDER — POLYETHYLENE GLYCOL 3350 17 G PO PACK: 17.0000 g | PACK | Freq: Two times a day (BID) | ORAL | Status: DC

## 2023-09-04 MED ORDER — ONDANSETRON HCL 4 MG/2ML IJ SOLN: 4.0000 mg | Freq: Four times a day (QID) | INTRAMUSCULAR | Status: DC | PRN

## 2023-09-04 MED ORDER — ENOXAPARIN SODIUM 40 MG/0.4ML IJ SOSY
40.0000 mg | PREFILLED_SYRINGE | INTRAMUSCULAR | Status: DC
  Filled 2023-09-04: qty 0.4

## 2023-09-04 MED ORDER — ACETAMINOPHEN 650 MG RE SUPP: 650.0000 mg | Freq: Four times a day (QID) | RECTAL | Status: DC | PRN

## 2023-09-04 MED ORDER — SODIUM CHLORIDE 0.9 % IV SOLN
1.0000 g | Freq: Once | INTRAVENOUS | Status: AC
  Administered 2023-09-04: 1 g via INTRAVENOUS
  Filled 2023-09-04: qty 10

## 2023-09-04 MED ORDER — ALBUTEROL SULFATE (2.5 MG/3ML) 0.083% IN NEBU: 2.5000 mg | INHALATION_SOLUTION | Freq: Four times a day (QID) | RESPIRATORY_TRACT | Status: DC | PRN

## 2023-09-04 MED ORDER — OXYCODONE HCL 5 MG PO TABS: 15.0000 mg | ORAL_TABLET | Freq: Every day | ORAL | Status: DC | PRN

## 2023-09-04 MED ORDER — CARVEDILOL 3.125 MG PO TABS
3.1250 mg | ORAL_TABLET | Freq: Two times a day (BID) | ORAL | Status: DC
  Administered 2023-09-04 – 2023-09-05 (×3): 3.125 mg via ORAL
  Filled 2023-09-04 (×3): qty 1

## 2023-09-04 MED ORDER — ROSUVASTATIN CALCIUM 5 MG PO TABS
10.0000 mg | ORAL_TABLET | Freq: Every day | ORAL | Status: DC
  Administered 2023-09-04 – 2023-09-06 (×3): 10 mg via ORAL
  Filled 2023-09-04 (×3): qty 2

## 2023-09-04 MED ORDER — HYDRALAZINE HCL 50 MG PO TABS
50.0000 mg | ORAL_TABLET | Freq: Two times a day (BID) | ORAL | Status: DC
  Administered 2023-09-05 – 2023-09-06 (×2): 50 mg via ORAL
  Filled 2023-09-04 (×2): qty 1

## 2023-09-04 MED ORDER — SODIUM CHLORIDE 0.9 % IV SOLN: INTRAVENOUS | Status: DC

## 2023-09-04 MED ORDER — ONDANSETRON HCL 4 MG PO TABS: 4.0000 mg | ORAL_TABLET | Freq: Four times a day (QID) | ORAL | Status: DC | PRN

## 2023-09-04 MED ORDER — HYDRALAZINE HCL 50 MG PO TABS
50.0000 mg | ORAL_TABLET | Freq: Four times a day (QID) | ORAL | Status: DC
  Administered 2023-09-04 (×2): 50 mg via ORAL
  Filled 2023-09-04 (×3): qty 1

## 2023-09-04 MED ORDER — PANTOPRAZOLE SODIUM 40 MG PO TBEC
40.0000 mg | DELAYED_RELEASE_TABLET | Freq: Every day | ORAL | Status: DC
  Administered 2023-09-04 – 2023-09-06 (×3): 40 mg via ORAL
  Filled 2023-09-04 (×3): qty 1

## 2023-09-04 MED ORDER — SULFAMETHOXAZOLE-TRIMETHOPRIM 400-80 MG PO TABS
1.0000 | ORAL_TABLET | ORAL | Status: DC
  Administered 2023-09-04 – 2023-09-06 (×2): 1 via ORAL
  Filled 2023-09-04 (×2): qty 1

## 2023-09-04 MED ORDER — HYDRALAZINE HCL 20 MG/ML IJ SOLN
10.0000 mg | INTRAMUSCULAR | Status: DC | PRN
  Administered 2023-09-05: 10 mg via INTRAVENOUS
  Filled 2023-09-04: qty 1

## 2023-09-04 MED ORDER — BISACODYL 10 MG RE SUPP: 10.0000 mg | Freq: Once | RECTAL | Status: DC

## 2023-09-04 NOTE — H&P (Addendum)
History and Physical    Patient: [name] GGY:[id number] DOB: [birth year] DOA: 09/03/2023 DOS: the patient was seen and examined on 09/04/2023 PCP: [name], MD  Patient coming from: Home  Chief Complaint:  Chief Complaint  Patient presents with   Dizziness   HPI: [name] is a 47 y.o. 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  Anytime the patient gets up or turns his head certain ways he feels like the room spinning around him.  Also reported having lightheadedness like he could pass out, but denies having any falls or loss of consciousness.  Patient was prescribed meclizine by his primary, but he reports no improvement in symptoms with taking the medication.  He initially had decreased appetite, but reports it has returned.  Associated symptoms included cloudy appearance of urine and foul odor that has also been present.  Denies having any significant fever, nausea, vomiting, abdominal pain, flank pain, or dysuria.  He has been admitted in the hospital with similar symptoms back in July found to have a E. coli urinary tract infection that was susceptible to everything except for Bactrim.   In the emergency department patient was noted to have a temperature of 100.2 F with blood pressures elevated up to 189/80, and all other vital signs maintained.  Labs from 9/8 significant for WBC 9.8, lactic acid 1.1, creatinine 2.11, BUN 16, and glucose 163.  CT scan of the head did not note any acute abnormality.  Urinalysis positive for moderate, large leukocytes, few bacteria, and greater than 50 WBCs.  Patient having given 1 L lactated Ringer's and Rocephin 1 g IV.  Review of Systems: As mentioned in the history of present illness. All  other systems reviewed and are negative. 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   Diabetes mellitus without complication [facility])    no since pancreas transplant   Dialysis patient [facility]) 04/18/2012   "[facility]; [place], [place], [place]"   Gastroparesis    Gastropathy    GERD (gastroesophageal reflux disease)    Hypertension    MRSA infection    over 10 years ago per patient. in legs   Past Surgical History:  Procedure Laterality Date   AV FISTULA PLACEMENT  08/2011   left upper arm   BELOW KNEE LEG AMPUTATION  "it's been awhile"   bilaterally   CATARACT EXTRACTION  ~ 2011   right   COMBINED KIDNEY-PANCREAS TRANSPLANT  2014   ESOPHAGOGASTRODUODENOSCOPY N/A 12/30/2016   Procedure: ESOPHAGOGASTRODUODENOSCOPY (EGD);  Surgeon: Jeani Hawking, MD;  Location: [facility] ENDOSCOPY;  Service: Endoscopy;  Laterality: N/A;   OLECRANON BURSECTOMY Right 06/23/2020   Procedure: RIGHT ELBOW EXCISION OLECRANON BURSITIS;  Surgeon: Nadara Mustard, MD;  Location: [facility];  Service: Orthopedics;  Laterality: Right;   Social History:  reports that he quit smoking about 13 months ago. His smoking use included cigars. He has never used smokeless tobacco. He reports that he does not currently use drugs after having used  the following drugs: Marijuana. Frequency: 3.00 times per week. He reports that he does not drink alcohol.  Allergies  Allergen Reactions   Amlodipine Rash   Lisinopril Rash    Family History  Problem Relation Age of Onset   Hypertension Mother    Diabetes Mother    Kidney disease Mother    Diabetes Maternal Grandmother    Diabetes Paternal Grandmother    Diabetes Other    Hypertension Other    Lung cancer Maternal Aunt    Colon cancer Neg Hx     Esophageal cancer Neg Hx    Rectal cancer Neg Hx    Stomach cancer Neg Hx     Prior to Admission medications   Medication Sig Start Date End Date Taking? Authorizing Provider  belatacept (NULOJIX) 250 MG SOLR injection Inject into the vein every 30 (thirty) days.    [provider]  carvedilol (COREG) 3.125 MG tablet Take 1 tablet (3.125 mg total) by mouth 2 (two) times daily with a meal. 07/22/23   [name], MD  ergocalciferol (VITAMIN D2) 1.25 MG (50000 UT) capsule Take 1 capsule (50,000 Units total) by mouth once a week. Patient taking differently: Take 1 capsule by mouth once a week. Wednesday 05/19/23     Ergocalciferol (VITAMIN D2) 50 MCG (2000 UT) TABS Take 1 tablet by mouth daily. 07/21/23     Ergocalciferol (VITAMIN D2) 50 MCG (2000 UT) TABS Take 2,000 Units by mouth daily. 08/18/23 09/17/23    fluocinonide cream (LIDEX) 0.05 % Apply one application to affected areas twice daily 01/25/23     hydrALAZINE (APRESOLINE) 50 MG tablet Take 1 tablet (50 mg total) by mouth 4 (four) times daily. 07/22/23   [name], MD  metoCLOPramide (REGLAN) 10 MG tablet Take 1 tablet (10 mg total) by mouth 4 (four) times daily. Take 1tab 20-30 minutes before breakfast, lunch, dinner and at bedtime 03/01/17   Unk [place], [place]  mycophenolate (MYFORTIC) 180 MG EC tablet Take 360 mg by mouth 2 (two) times daily. 07/06/13   [provider]  ofloxacin (OCUFLOX) 0.3 % ophthalmic solution Place 1 drop into the left eye 4 (four) times daily. 06/28/23   [provider]  omeprazole (PRILOSEC) 40 MG capsule Take 40 mg by mouth daily. 02/01/16   [provider]  oxyCODONE (ROXICODONE) 15 MG immediate release tablet Take 1 tablet (15 mg total) by mouth 5 (five) times daily as needed. Patient taking differently: Take 15 mg by mouth 5 (five) times daily as needed for pain. 06/23/23     oxyCODONE (ROXICODONE) 15 MG immediate release tablet Take 1 tablet (15 mg total) by mouth 5  (five) times daily as needed. 07/21/23     oxyCODONE (ROXICODONE) 15 MG immediate release tablet Take 1 tablet (15 mg total) by mouth 5 (five) times daily as needed. 08/18/23     predniSONE (DELTASONE) 5 MG tablet Take 5 mg by mouth daily. 06/20/13   [provider]  rosuvastatin (CRESTOR) 10 MG tablet Take 1 tablet (10 mg total) by mouth daily. 07/26/23   [name], MD  sulfamethoxazole-trimethoprim (BACTRIM,SEPTRA) 400-80 MG per tablet Take 1 tablet by mouth every Monday, Wednesday, and Friday.     [provider]    Physical Exam: Vitals:   09/03/23 2130 09/03/23 2205 09/04/23 0220 09/04/23 0500  BP: (!) 150/73  (!) 148/87 (!) 172/75  Pulse: 85  78 72  Resp: 19  17 18   Temp: 100.2 F (37.9  C)  98 F (36.7 C)   SpO2: 97%  98% 100%  Weight:  88.5 kg    Height:  5\' 11"  (1.803 m)      Constitutional: Middle-age male currently in NAD, calm, comfortable Eyes: PERRL, lids and conjunctivae normal ENMT: Mucous membranes are moist. Posterior pharynx clear of any exudate or lesions.Normal dentition.  Neck: normal, supple, no masses, no thyromegaly Respiratory: clear to auscultation bilaterally, no wheezing, no crackles. Normal respiratory effort. No accessory muscle use.  Cardiovascular: Regular rate and rhythm, no murmurs / rubs / gallops. No extremity edema.   Abdomen: no tenderness, no masses palpated.  No CVA tenderness.  Bowel sounds positive.  Musculoskeletal: no clubbing / cyanosis.  Bilateral BKA good ROM, no contractures. Normal muscle tone.  Skin: no rashes, lesions, ulcers. No induration Neurologic: CN 2-12 grossly intact. Sensation intact, DTR normal. Strength 5/5 in all 4.  Psychiatric: Normal judgment and insight. Alert and oriented x 3. Normal mood.   Data Reviewed:  EKG revealed normal sinus rhythm at 83 bpm with left axis deviation.  Reviewed labs, imaging, and pertinent records  Assessment and Plan: Urinary tract infection Acute.  Patient reports  having cloudy urine with foul odor.  Urinalysis positive for moderate, large leukocytes, few bacteria, and greater than 50 WBCs.  Last admitted into the hospital back in July with an E. coli  urinary tract infection that was resistant only to Bactrim.  Patient had been given Rocephin IV. -Admit to a medical telemetry bed -Follow-up urine culture -Continue Rocephin IV.  Adjust antibiotics as needed  Acute kidney injury superimposed on chronic kidney disease IIIa On admission creatinine elevated at 2.11 with BUN 16.  Creatinine previously noted to range around 1.4-1.7. -Monitor intake and output -Check urine sodium and urine creatinine to calculate FeNa -Check renal ultrasound -Continue normal saline IV fluids at 100 mL/h -Recheck kidney function in a.m.  Dizziness Acute.  Patient reports room spinning around him with any kind of movement, but also reported lightheadedness over the last 2 weeks.  Meclizine was ineffective.  Blood pressures were noted to be elevated.  CT scan of the head did not note any acute abnormality.  Question possibility of BPPV.  -Check orthostatic vital signs -PT to evaluate and treat/ vestibular PT evaluation  Hypertensive urgency Blood pressures initially elevated up to 189/80. -Continue home blood pressure regimen of Coreg and hydralazine -Hydralazine IV as needed for elevated blood pressures  Status post renal and pancreatic transplant on chronic immunosuppression Patient status post renal and pancreatic transplant back in 2014.  Patient on mycophenolate 360 mg twice daily and belatacept infusions -Continue prednisone, mycophenolate, and Bactrim  Controlled diabetes mellitus type 2, without long-term use of insulin Admission glucose mildly elevated at 160.  Last hemoglobin A1c was 5.9 on 05/18/2023.  Patient's status post pancreatic transplant for which diabetes is well-controlled.  PAD Patient's status post bilateral below-knee amputations.  Chronic pain  syndrome -Continue oxycodone as needed for pain.  Dyslipidemia -Continue Crestor  Genella Rife -Continue pharmacy substitution of Protonix for omeprazole  DVT prophylaxis: Patient declined Lovenox Advance Care Planning:   Code Status: Full Code    Consults: None  Family Communication: Girlfriend updated at bedside  Severity of Illness: The appropriate patient status for this patient is INPATIENT. Inpatient status is judged to be reasonable and necessary in order to provide the required intensity of service to ensure the patient's safety. The patient's presenting symptoms, physical exam findings, and initial radiographic and laboratory data in the  context of their chronic comorbidities is felt to place them at high risk for further clinical deterioration. Furthermore, it is not anticipated that the patient will be medically stable for discharge from the hospital within 2 midnights of admission.    * I certify that at the point of admission it is my clinical judgment that the patient will require inpatient hospital care spanning beyond 2 midnights from the point of admission due to high intensity of service, high risk for further deterioration and high frequency of surveillance required.*    Author: Clydie Braun, MD 09/04/2023 7:25 AM  For on call review [email or web address].

## 2023-09-04 NOTE — ED Provider Notes (Signed)
[place] EMERGENCY DEPARTMENT AT [facility] Provider Note   CSN: [id number] Arrival date & time: 09/03/23  2117     History  Chief Complaint  Patient presents with   Dizziness    [name] is a 47 y.o. male.  The history is provided by the patient.  Dizziness Quality:  Lightheadedness and vertigo Severity:  Moderate Onset quality:  Gradual Duration:  2 weeks Timing:  Intermittent Progression:  Worsening Chronicity:  New Worsened by:  Movement Associated symptoms: no headaches, no shortness of breath, no vision changes and no vomiting    Patient w/history of renal/pancreas transplant, hypertension, bilateral BKA presents with dizziness/vertigo.  Patient reports for the past 2 weeks has had vertigo that is worse with ambulation.  He reports that he has difficulty walking at times but no falls or trauma.  No new medicines.  Over the past several days he has had foul-smelling urine and he is concerned about a UTI. No known fevers.  No headache or visual changes.  No chest pain or shortness of breath.  No abdominal pain. Patient is on immune suppressants at baseline including prednisone   Past Medical History:  Diagnosis Date   AMPUTATION, BELOW KNEE, 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   Diabetes mellitus without complication [facility])    no since pancreas transplant   Dialysis patient [facility]) 04/18/2012   "[facility]; [place], [place], Sat"   Gastroparesis    Gastropathy    GERD (gastroesophageal reflux disease)    Hypertension    MRSA infection    over 10 years ago per patient. in legs    Home Medications Prior to Admission medications   Medication Sig Start Date End Date Taking? Authorizing Provider  belatacept (NULOJIX) 250 MG SOLR injection Inject into the vein every 30 (thirty)  days.    [provider]  carvedilol (COREG) 3.125 MG tablet Take 1 tablet (3.125 mg total) by mouth 2 (two) times daily with a meal. 07/22/23   [name], MD  ergocalciferol (VITAMIN D2) 1.25 MG (50000 UT) capsule Take 1 capsule (50,000 Units total) by mouth once a week. Patient taking differently: Take 1 capsule by mouth once a week. Wednesday 05/19/23     Ergocalciferol (VITAMIN D2) 50 MCG (2000 UT) TABS Take 1 tablet by mouth daily. 07/21/23     Ergocalciferol (VITAMIN D2) 50 MCG (2000 UT) TABS Take 2,000 Units by mouth daily. 08/18/23 09/17/23    fluocinonide cream (LIDEX) 0.05 % Apply one application to affected areas twice daily 01/25/23     hydrALAZINE (APRESOLINE) 50 MG tablet Take 1 tablet (50 mg total) by mouth 4 (four) times daily. 07/22/23   [name], MD  metoCLOPramide (REGLAN) 10 MG tablet Take 1 tablet (10 mg total) by mouth 4 (four) times daily. Take 1tab 20-30 minutes before breakfast, lunch, dinner and at bedtime 03/01/17   Unk [place], [place]  mycophenolate (MYFORTIC) 180 MG EC tablet Take 360 mg by mouth 2 (two) times daily. 07/06/13   [provider]  ofloxacin (OCUFLOX) 0.3 % ophthalmic solution Place 1 drop into the left eye 4 (four) times daily. 06/28/23   [provider]  omeprazole (PRILOSEC) 40 MG capsule Take 40 mg by mouth  daily. 02/01/16   [provider]  oxyCODONE (ROXICODONE) 15 MG immediate release tablet Take 1 tablet (15 mg total) by mouth 5 (five) times daily as needed. Patient taking differently: Take 15 mg by mouth 5 (five) times daily as needed for pain. 06/23/23     oxyCODONE (ROXICODONE) 15 MG immediate release tablet Take 1 tablet (15 mg total) by mouth 5 (five) times daily as needed. 07/21/23     oxyCODONE (ROXICODONE) 15 MG immediate release tablet Take 1 tablet (15 mg total) by mouth 5 (five) times daily as needed. 08/18/23     predniSONE (DELTASONE) 5 MG tablet Take 5 mg by mouth daily. 06/20/13   [provider]  rosuvastatin (CRESTOR) 10 MG tablet Take 1 tablet (10 mg total) by mouth daily. 07/26/23   [name], MD  sulfamethoxazole-trimethoprim (BACTRIM,SEPTRA) 400-80 MG per tablet Take 1 tablet by mouth every Monday, Wednesday, and Friday.     [provider]      Allergies    Amlodipine and Lisinopril    Review of Systems   Review of Systems  Respiratory:  Negative for shortness of breath.   Gastrointestinal:  Negative for vomiting.  Neurological:  Positive for dizziness. Negative for headaches.    Physical Exam Updated Vital Signs BP (!) 172/75 (BP Location: Right Arm)   Pulse 72   Temp 98 F (36.7 C)   Resp 18   Ht 1.803 m (5\' 11" )   Wt 88.5 kg   SpO2 100%   BMI 27.21 kg/m  Physical Exam CONSTITUTIONAL: Chronically ill-appearing HEAD: Normocephalic/atraumatic EYES: EOMI/PERRL, no nystagmus ENMT: Mucous membranes moist NECK: supple no meningeal signs CV: S1/S2 noted, no murmurs/rubs/gallops noted LUNGS: Lungs are clear to auscultation bilaterally, no apparent distress ABDOMEN: soft, nontender, no rebound or guarding GU:no cva tenderness NEURO:Awake/alert, face symmetric, no arm or leg drift is noted Equal 5/5 strength with shoulder abduction, elbow flex/extension, wrist flex/extension in upper extremities and equal hand grips bilaterally Cranial nerves 3/4/5/6/07/03/09/11/12 tested and intact Sensation to light touch intact in all extremities EXTREMITIES: Bilateral BKA at baseline SKIN: warm, color normal PSYCH: no abnormalities of mood noted   ED Results / Procedures / Treatments   Labs (all labs ordered are listed, but only abnormal results are displayed) Labs Reviewed  COMPREHENSIVE METABOLIC PANEL - Abnormal; Notable for the following components:      Result Value   CO2 17 (*)    Glucose, Bld 163 (*)    Creatinine, Ser 2.11 (*)    Calcium 8.4 (*)    Total Protein 6.4 (*)    Albumin 2.8 (*)    AST 12 (*)    GFR, Estimated 38 (*)     All other components within normal limits  CBC - Abnormal; Notable for the following components:   RDW 15.9 (*)    All other components within normal limits  URINALYSIS, ROUTINE W REFLEX MICROSCOPIC - Abnormal; Notable for the following components:   APPearance CLOUDY (*)    Hgb urine dipstick MODERATE (*)    Protein, ur 30 (*)    Leukocytes,Ua LARGE (*)    Bacteria, UA FEW (*)    All other components within normal limits  URINE CULTURE  CULTURE, BLOOD (ROUTINE X 2)  CULTURE, BLOOD (ROUTINE X 2)  LIPASE, BLOOD  I-STAT CG4 LACTIC ACID, ED    EKG EKG Interpretation Date/Time:  Sunday September 03 2023 22:14:33 EDT Ventricular Rate:  83 PR Interval:  166 QRS Duration:  92 QT  Interval:  366 QTC Calculation: 430 R Axis:   -51  Text Interpretation: Normal sinus rhythm Left axis deviation Possible Anteroseptal infarct , age undetermined Abnormal ECG Confirmed by Zadie Rhine ([id number]) on 09/04/2023 4:36:40 AM  Radiology CT HEAD WO CONTRAST  Result Date: 09/04/2023 CLINICAL DATA:  Vertigo. EXAM: CT HEAD WITHOUT CONTRAST TECHNIQUE: Contiguous axial images were obtained from the base of the skull through the vertex without intravenous contrast. RADIATION DOSE REDUCTION: This exam was performed according to the departmental dose-optimization program which includes automated exposure control, adjustment of the mA and/or kV according to patient size and/or use of iterative reconstruction technique. COMPARISON:  06/25/2023 FINDINGS: Brain: There is no evidence for acute hemorrhage, hydrocephalus, mass lesion, or abnormal extra-axial fluid collection. No definite CT evidence for acute infarction. Vascular: No hyperdense vessel or unexpected calcification. Skull: No evidence for fracture. No worrisome lytic or sclerotic lesion. Sinuses/Orbits: The visualized paranasal sinuses and mastoid air cells are clear. Visualized portions of the globes and intraorbital fat are unremarkable. Other: None.  IMPRESSION: No acute intracranial abnormality. Electronically Signed   By: Kennith Center M.D.   On: 09/04/2023 06:05    Procedures Procedures    Medications Ordered in ED Medications  lactated ringers bolus 1,000 mL (has no administration in time range)  cefTRIAXone (ROCEPHIN) 1 g in sodium chloride 0.9 % 100 mL IVPB (0 g Intravenous Stopped 09/04/23 6045)    ED Course/ Medical Decision Making/ A&P Clinical Course as of 09/04/23 4098  [facility] Sep 04, 2023  1191 Patient with complicated history including previous renal and pancreas transplant on immunosuppressants.  Has had vertiginous symptoms with ataxia for over 2 weeks.  Also reports recent symptoms of a UTI.  Tmax here 100.2.  Suspicious for early sepsis.  Will start IV fluids and antibiotics.  Will need admission.  Will perform CT head due to vertiginous symptoms for 2 weeks [DW]  0636 Patient resting comfortably.  CT head is negative. Given his multiple comorbidities and high risk for worsening infection due to immune immune suppressed status, patient would benefit from admission, IV fluids and IV antibiotics.  Patient is not currently septic.  Cultures are currently pending.  He has received IV Rocephin.  Would recommend IV fluids, monitoring and if dizziness does not improve he may benefit from MRI brain.  However at this time there is no signs of acute CVA [DW]  [phone] Discussed with Dr. Antionette Char for admission [DW]    Clinical Course User Index [DW] Zadie Rhine, MD                                 Medical Decision Making Amount and/or Complexity of Data Reviewed Radiology: ordered.  Risk Decision regarding hospitalization.   This patient presents to the ED for concern of dizziness and weakness, this involves an extensive number of treatment options, and is a complaint that carries with it a high risk of complications and morbidity.  The differential diagnosis includes but is not limited to CVA, intracranial hemorrhage, acute  coronary syndrome, renal failure, urinary tract infection, electrolyte disturbance, pneumonia    Comorbidities that complicate the patient evaluation: Patient's presentation is complicated by their history of renal/ pancreas transplant  Social Determinants of Health: Patient's  marijuana use   increases the complexity of managing their presentation  Additional history obtained: Additional history obtained from significant other Records reviewed previous admission documents  Lab Tests: I Ordered, and personally  interpreted labs.  The pertinent results include: Acute kidney injury, UTI  Imaging Studies ordered: I ordered imaging studies including CT scan head   I independently visualized and interpreted imaging which showed no acute findings I agree with the radiologist interpretation  Medicines ordered and prescription drug management: I ordered medication including IV antibiotics for UTI Reevaluation of the patient after these medicines showed that the patient    stayed the same   Critical Interventions:   IV fluids and antibiotics  Consultations Obtained: I requested consultation with the admitting physician [facility] , and discussed  findings as well as pertinent plan - they recommend: Admit  Reevaluation: After the interventions noted above, I reevaluated the patient and found that they have :stayed the same  Complexity of problems addressed: Patient's presentation is most consistent with  acute presentation with potential threat to life or bodily function  Disposition: After consideration of the diagnostic results and the patient's response to treatment,  I feel that the patent would benefit from admission   .           Final Clinical Impression(s) / ED Diagnoses Final diagnoses:  Dizziness  Acute cystitis without hematuria    Rx / DC Orders ED Discharge Orders     None         Zadie Rhine, MD 09/04/23 [phone]

## 2023-09-04 NOTE — Progress Notes (Signed)
Patient stated he has not had bowel movement in a week.[facility] Hospitalist Dr. Joneen Roach patient is a double transplant Kidney and Pancreas

## 2023-09-04 NOTE — ED Notes (Signed)
ED TO INPATIENT HANDOFF REPORT  ED Nurse Name and Phone #:   S Name/Age/Gender [name] 47 y.o. male Room/Bed: 043C/043C  Code Status   Code Status: Full Code  Home/SNF/Other Home Patient oriented to: self, place, time, and situation Is this baseline? Yes   Triage Complete: Triage complete  Chief Complaint AKI (acute kidney injury) (HCC) [N17.9] UTI (urinary tract infection) [N39.0]  Triage Note The pt reports that he has had vertigo for 2 weeks his doctor called in meclizine but that has not helped.  No nausea tired and sleeping a lot  2 weeks ago he was told that he has a small uti  and the doctor did not want to treat that at the time  the pt reports a foul odor to his urine.  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   Allergies Allergies  Allergen Reactions   Amlodipine Rash   Lisinopril Rash    Level of Care/Admitting Diagnosis ED Disposition     ED Disposition  Admit   Condition  --   Comment  Hospital Area: [facility] [[id number]]  Level of Care: Telemetry Medical [104]  May place patient in observation at [facility] or [facility] if equivalent level of care is available:: No  Covid Evaluation: Asymptomatic - no recent exposure (last 10 days) testing not required  Diagnosis: UTI (urinary tract infection) [478295]  Admitting Physician: Clydie Braun [[id number]]  Attending Physician: Clydie Braun [[id number]]          B Medical/Surgery History Past Medical History:  Diagnosis Date   AMPUTATION, BELOW KNEE, 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   Diabetes mellitus without complication [facility])    no since pancreas transplant   Dialysis patient [facility]) 04/18/2012   "[facility]; [place], [place], [place]"   Gastroparesis    Gastropathy    GERD  (gastroesophageal reflux disease)    Hypertension    MRSA infection    over 10 years ago per patient. in legs   Past Surgical History:  Procedure Laterality Date   AV FISTULA PLACEMENT  08/2011   left upper arm   BELOW KNEE LEG AMPUTATION  "it's been awhile"   bilaterally   CATARACT EXTRACTION  ~ 2011   right   COMBINED KIDNEY-PANCREAS TRANSPLANT  2014   ESOPHAGOGASTRODUODENOSCOPY N/A 12/30/2016   Procedure: ESOPHAGOGASTRODUODENOSCOPY (EGD);  Surgeon: Jeani Hawking, MD;  Location: [facility] ENDOSCOPY;  Service: Endoscopy;  Laterality: N/A;   OLECRANON BURSECTOMY Right 06/23/2020   Procedure: RIGHT ELBOW EXCISION OLECRANON BURSITIS;  Surgeon: Nadara Mustard, MD;  Location: [facility];  Service: Orthopedics;  Laterality: Right;     A IV Location/Drains/Wounds Patient Lines/Drains/Airways Status     Active Line/Drains/Airways     Name Placement date Placement time Site Days   Peripheral IV 09/04/23 20 G Left;Posterior Wrist 09/04/23  0527  Wrist  less than 1   Pressure Injury 07/17/17 Stage II -  Partial thickness loss of dermis presenting as a shallow open ulcer with a red, pink wound bed without slough. 0.75 cm x 0.75 cm 07/17/17  2115  -- 2240   Wound / Incision (Open or Dehisced) 05/14/17 Other (Comment) Hand Left Skin red  and swollen. 05/14/17  1252  Hand  2304            Intake/Output Last 24 hours  Intake/Output Summary (Last 24 hours) at 09/04/2023 1148 Last data filed at 09/04/2023 1610 Gross per 24 hour  Intake 100 ml  Output --  Net 100 ml    Labs/Imaging Results for orders placed or performed during the hospital encounter of 09/03/23 (from the past 48 hour(s))  Lipase, blood     Status: None   Collection Time: 09/03/23 10:17 PM  Result Value Ref Range   Lipase 51 11 - 51 U/L    Comment: Performed at [facility], [address] [place], [place] [address]  Comprehensive metabolic panel     Status: Abnormal   Collection Time: 09/03/23 10:17 PM   Result Value Ref Range   Sodium 137 135 - 145 mmol/L   Potassium 3.6 3.5 - 5.1 mmol/L   Chloride 107 98 - 111 mmol/L   CO2 17 (L) 22 - 32 mmol/L   Glucose, Bld 163 (H) 70 - 99 mg/dL    Comment: Glucose reference range applies only to samples taken after fasting for at least 8 hours.   BUN 16 6 - 20 mg/dL   Creatinine, Ser 4.09 (H) 0.61 - 1.24 mg/dL   Calcium 8.4 (L) 8.9 - 10.3 mg/dL   Total Protein 6.4 (L) 6.5 - 8.1 g/dL   Albumin 2.8 (L) 3.5 - 5.0 g/dL   AST 12 (L) 15 - 41 U/L   ALT 8 0 - 44 U/L   Alkaline Phosphatase 85 38 - 126 U/L   Total Bilirubin 0.6 0.3 - 1.2 mg/dL   GFR, Estimated 38 (L) >60 mL/min    Comment: (NOTE) Calculated using the CKD-EPI Creatinine Equation (2021)    Anion gap 13 5 - 15    Comment: Performed at [facility], [address], [place], [place] [address]  CBC     Status: Abnormal   Collection Time: 09/03/23 10:17 PM  Result Value Ref Range   WBC 9.8 4.0 - 10.5 K/uL   RBC 4.95 4.22 - 5.81 MIL/uL   Hemoglobin 15.2 13.0 - 17.0 g/dL   HCT 47.8 29.5 - 62.1 %   MCV 96.6 80.0 - 100.0 fL   MCH 30.7 26.0 - 34.0 pg   MCHC 31.8 30.0 - 36.0 g/dL   RDW 30.8 (H) 65.7 - 84.6 %   Platelets 267 150 - 400 K/uL   nRBC 0.0 0.0 - 0.2 %    Comment: Performed at [facility], [address] [place], [place] [address]  Urinalysis, Routine w reflex microscopic -Urine, Clean Catch     Status: Abnormal   Collection Time: 09/03/23 10:20 PM  Result Value Ref Range   Color, Urine YELLOW YELLOW   APPearance CLOUDY (A) CLEAR   Specific Gravity, Urine 1.005 1.005 - 1.030   pH 7.0 5.0 - 8.0   Glucose, UA NEGATIVE NEGATIVE mg/dL   Hgb urine dipstick MODERATE (A) NEGATIVE   Bilirubin Urine NEGATIVE NEGATIVE   Ketones, ur NEGATIVE NEGATIVE mg/dL   Protein, ur 30 (A) NEGATIVE mg/dL   Nitrite NEGATIVE NEGATIVE   Leukocytes,Ua LARGE (A) NEGATIVE   RBC / HPF 0-5 0 - 5 RBC/hpf   WBC, UA >50 0 - 5 WBC/hpf   Bacteria, UA FEW (A) NONE SEEN   Squamous Epithelial /  HPF 0-5 0 - 5 /HPF   WBC Clumps PRESENT  Comment: Performed at [facility], [address] [place], [place] [address]  I-Stat Lactic Acid, ED     Status: None   Collection Time: 09/04/23  5:40 AM  Result Value Ref Range   Lactic Acid, Venous 1.1 0.5 - 1.9 mmol/L   US Renal Transplant w/Doppler  Result Date: 09/04/2023 CLINICAL DATA:  Acute kidney injury EXAM: ULTRASOUND OF RENAL TRANSPLANT WITH RENAL DOPPLER ULTRASOUND TECHNIQUE: Ultrasound examination of the renal transplant was performed with gray-scale, color and duplex doppler evaluation. COMPARISON:  04/11/2018 FINDINGS: Transplant kidney location: RLQ Transplant Kidney: Renal measurements: 3.3 x 5.2 x 6.1 cm = volume: . Normal in size and parenchymal echogenicity. No evidence of mass or hydronephrosis. No peri-transplant fluid collection seen. Color flow in the main renal artery:  Yes Color flow in the main renal vein:  Yes Duplex Doppler Evaluation: Main Renal Artery Velocity: 103.9 cm/sec Main Renal Artery Resistive Index: 0.8 Venous waveform in main renal vein:  present/absent Intrarenal resistive index in upper pole:  0.7 (normal 0.6-0.8; equivocal 0.8-0.9; abnormal >= 0.9) Intrarenal resistive index in lower pole: 0.7 (normal 0.6-0.8; equivocal 0.8-0.9; abnormal >= 0.9) Bladder: Normal for degree of bladder distention. Other findings:  None. IMPRESSION: Right lower quadrant renal transplant without hydronephrosis. Patent transplant vasculature. Electronically Signed   By: Charline Bills M.D.   On: 09/04/2023 10:53   CT HEAD WO CONTRAST  Result Date: 09/04/2023 CLINICAL DATA:  Vertigo. EXAM: CT HEAD WITHOUT CONTRAST TECHNIQUE: Contiguous axial images were obtained from the base of the skull through the vertex without intravenous contrast. RADIATION DOSE REDUCTION: This exam was performed according to the departmental dose-optimization program which includes automated exposure control, adjustment of the mA and/or kV according  to patient size and/or use of iterative reconstruction technique. COMPARISON:  06/25/2023 FINDINGS: Brain: There is no evidence for acute hemorrhage, hydrocephalus, mass lesion, or abnormal extra-axial fluid collection. No definite CT evidence for acute infarction. Vascular: No hyperdense vessel or unexpected calcification. Skull: No evidence for fracture. No worrisome lytic or sclerotic lesion. Sinuses/Orbits: The visualized paranasal sinuses and mastoid air cells are clear. Visualized portions of the globes and intraorbital fat are unremarkable. Other: None. IMPRESSION: No acute intracranial abnormality. Electronically Signed   By: Kennith Center M.D.   On: 09/04/2023 06:05    Pending Labs Unresulted Labs (From admission, onward)     Start     Ordered   09/05/23 0500  Basic metabolic panel  Tomorrow morning,   R        09/04/23 0908   09/05/23 0500  CBC  Tomorrow morning,   R        09/04/23 0908   09/04/23 0907  Sodium, urine, random  Once,   R        09/04/23 0906   09/04/23 0907  Creatinine, urine, random  Once,   R        09/04/23 0906   09/04/23 0456  Blood Culture (routine x 2)  (Undifferentiated presentation (screening labs and basic nursing orders))  BLOOD CULTURE X 2,   STAT      09/04/23 0456   09/03/23 2215  Urine Culture  Once,   URGENT       Question:  Indication  Answer:  Sepsis   09/03/23 2215            Vitals/Pain Today's Vitals   09/04/23 0736 09/04/23 0830 09/04/23 0834 09/04/23 1000  BP:  (!) 189/80  (!) 174/83  Pulse:  73  73  Resp:  (!) 22  (!) 22  Temp:   98.3 F (36.8 C)   TempSrc:   Oral   SpO2:  97%  100%  Weight:      Height:      PainSc: 0-No pain       Isolation Precautions No active isolations  Medications Medications  oxyCODONE (Oxy IR/ROXICODONE) immediate release tablet 15 mg (has no administration in time range)  sulfamethoxazole-trimethoprim (BACTRIM) 400-80 MG per tablet 1 tablet (1 tablet Oral Given 09/04/23 1050)  rosuvastatin  (CRESTOR) tablet 10 mg (10 mg Oral Given 09/04/23 1050)  predniSONE (DELTASONE) tablet 5 mg (5 mg Oral Given 09/04/23 1051)  hydrALAZINE (APRESOLINE) tablet 50 mg (50 mg Oral Given 09/04/23 1050)  carvedilol (COREG) tablet 3.125 mg (has no administration in time range)  enoxaparin (LOVENOX) injection 40 mg (has no administration in time range)  sodium chloride flush (NS) 0.9 % injection 3 mL (3 mLs Intravenous Not Given 09/04/23 1045)  acetaminophen (TYLENOL) tablet 650 mg (has no administration in time range)    Or  acetaminophen (TYLENOL) suppository 650 mg (has no administration in time range)  ondansetron (ZOFRAN) tablet 4 mg (has no administration in time range)    Or  ondansetron (ZOFRAN) injection 4 mg (has no administration in time range)  albuterol (PROVENTIL) (2.5 MG/3ML) 0.083% nebulizer solution 2.5 mg (2.5 mg Nebulization Not Given 09/04/23 1045)  0.9 %  sodium chloride infusion ( Intravenous New Bag/Given 09/04/23 1045)  pantoprazole (PROTONIX) EC tablet 40 mg (40 mg Oral Given 09/04/23 1051)  mycophenolate (MYFORTIC) EC tablet 360 mg (360 mg Oral Given 09/04/23 1119)  cefTRIAXone (ROCEPHIN) 2 g in sodium chloride 0.9 % 100 mL IVPB (has no administration in time range)  hydrALAZINE (APRESOLINE) injection 10 mg (has no administration in time range)  lactated ringers bolus 1,000 mL (0 mLs Intravenous Stopped 09/04/23 1011)  cefTRIAXone (ROCEPHIN) 1 g in sodium chloride 0.9 % 100 mL IVPB (0 g Intravenous Stopped 09/04/23 4098)    Mobility non-ambulatory     Focused Assessments    R Recommendations: See Admitting Provider Note  Report given to:   Additional Notes:

## 2023-09-05 DIAGNOSIS — E875 Hyperkalemia: Secondary | ICD-10-CM

## 2023-09-05 DIAGNOSIS — B9620 Unspecified Escherichia coli [E. coli] as the cause of diseases classified elsewhere: Secondary | ICD-10-CM

## 2023-09-05 DIAGNOSIS — N1832 Chronic kidney disease, stage 3b: Secondary | ICD-10-CM

## 2023-09-05 DIAGNOSIS — N179 Acute kidney failure, unspecified: Secondary | ICD-10-CM | POA: Diagnosis not present

## 2023-09-05 DIAGNOSIS — N390 Urinary tract infection, site not specified: Secondary | ICD-10-CM

## 2023-09-05 LAB — CBC
HCT: 48.2 % (ref 39.0–52.0)
Hemoglobin: 15.4 g/dL (ref 13.0–17.0)
MCH: 31.1 pg (ref 26.0–34.0)
MCHC: 32 g/dL (ref 30.0–36.0)
MCV: 97.4 fL (ref 80.0–100.0)
Platelets: 262 10*3/uL (ref 150–400)
RBC: 4.95 MIL/uL (ref 4.22–5.81)
RDW: 16 % — ABNORMAL HIGH (ref 11.5–15.5)
WBC: 9.5 10*3/uL (ref 4.0–10.5)
nRBC: 0 % (ref 0.0–0.2)

## 2023-09-05 LAB — BASIC METABOLIC PANEL
Anion gap: 12 (ref 5–15)
BUN: 14 mg/dL (ref 6–20)
CO2: 13 mmol/L — ABNORMAL LOW (ref 22–32)
Calcium: 8.2 mg/dL — ABNORMAL LOW (ref 8.9–10.3)
Chloride: 112 mmol/L — ABNORMAL HIGH (ref 98–111)
Creatinine, Ser: 1.84 mg/dL — ABNORMAL HIGH (ref 0.61–1.24)
GFR, Estimated: 45 mL/min — ABNORMAL LOW (ref >60)
Glucose, Bld: 95 mg/dL (ref 70–99)
Potassium: 5.8 mmol/L — ABNORMAL HIGH (ref 3.5–5.1)
Sodium: 137 mmol/L (ref 135–145)

## 2023-09-05 MED ORDER — SODIUM ZIRCONIUM CYCLOSILICATE 10 G PO PACK
10.0000 g | PACK | Freq: Once | ORAL | Status: AC
  Administered 2023-09-05: 10 g via ORAL
  Filled 2023-09-05: qty 1

## 2023-09-05 MED ORDER — SENNOSIDES-DOCUSATE SODIUM 8.6-50 MG PO TABS: 1.0000 | ORAL_TABLET | Freq: Two times a day (BID) | ORAL | Status: DC | PRN

## 2023-09-05 NOTE — Plan of Care (Signed)

## 2023-09-05 NOTE — Progress Notes (Signed)
Patient did refuse the suppository. He inquired if he could take the Miralax because of being a transplant of Kidney and Pancreas I did call pharmacy to inquire of this. I was told to ask the patient, however the medication was discontinued. He did drink warm prune juice and I informed him to eat grapes, cantaloupes and other high fiber foods

## 2023-09-05 NOTE — Progress Notes (Signed)
PROGRESS NOTE    [name]  ZOX:[id number] DOB: 12-08-1976 DOA: 09/03/2023 PCP: [name], MD    Brief Narrative:   [name] is a 47 y.o. 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  Anytime the patient gets up or turns his head certain ways he feels like the room spinning around him.  Also reported having lightheadedness like he could pass out, but denies having any falls or loss of consciousness.  Patient was prescribed meclizine by his primary, but he reports no improvement in symptoms with taking the medication.  He initially had decreased appetite, but reports it has returned.  Associated symptoms included cloudy appearance of urine and foul odor that has also been present.  Denies having any significant fever, nausea, vomiting, abdominal pain, flank pain, or dysuria.    He has been admitted in the hospital with similar symptoms back in July found to have a E. coli urinary tract infection that was susceptible to everything except for Bactrim.    In the emergency department patient was noted to have a temperature of 100.2 F with blood pressures elevated up to 189/80, and all other vital signs maintained.  Labs from 9/8 significant for WBC 9.8, lactic acid 1.1, creatinine 2.11, BUN 16, and glucose 163.  CT scan of the head did not note any acute abnormality.  Urinalysis positive for moderate, large leukocytes, few bacteria, and greater than 50 WBCs.  Patient having given 1 L lactated Ringer's and Rocephin 1 g IV.  EDP consulted [facility] for admission for further evaluation management of UTI, dizziness.  Assessment & Plan:   E. coli urinary tract infection Patient reports having cloudy urine with foul odor.  Urinalysis  positive for moderate, large leukocytes, few bacteria, and greater than 50 WBCs.  Last admitted into the hospital back in July with an E. coli  urinary tract infection that was resistant only to Bactrim.   -- Urine culture: + >100K Ecoli; susceptibilities pending -- Ceftriaxone 2 g IV q24h   Acute kidney injury superimposed on chronic kidney disease IIIa On admission creatinine elevated at 2.11 with BUN 16.  Creatinine previously noted to range around 1.4-1.7.  Renal transplant ultrasound with right lower quadrant renal transplant without hydronephrosis, patent transplant vasculature. -- Cr 2.11>1.84 -- Continue normal saline IV fluids at 100 mL/h -- BMP in AM  Hyperkalemia Potassium 5.8, likely secondary to acute renal failure.  Will give dose of Lokelma today. -- Repeat BMP in the a.m.   Dizziness: Improving Patient reports room spinning around him with any kind of movement, but also reported lightheadedness over the last 2 weeks.  Meclizine was ineffective.  Blood pressures were noted to be elevated.  CT scan of the head did not note any acute abnormality.  Question possibility of BPPV.  Seen by physical therapy for vestibular assessment, no nystagmus noted.   Hypertensive urgency Blood pressures initially elevated up to 189/80. -- Carvedilol 3.125 mg p.o. twice daily -- Hydralazine 50 mg p.o. twice daily -- Hydralazine 10 mg IV every 4 hours as needed SBP greater than 180 or DBP greater than 110   Status post renal and pancreatic transplant on chronic immunosuppression Patient status post renal and pancreatic transplant back in 2014.  Patient on mycophenolate 360 mg twice daily and  belatacept infusions -Continue prednisone 5 mg p.o. daily, mycophenolate 360 mg p.o. twice daily, and Bactrim every Monday/Wednesday/Friday   Controlled diabetes mellitus type 2, without long-term use of insulin Admission glucose mildly elevated at 160.  Last hemoglobin A1c was 5.9 on 05/18/2023.  Patient's  status post pancreatic transplant for which diabetes is well-controlled.   PAD Patient's status post bilateral below-knee amputations.   Chronic pain syndrome -- Continue oxycodone as needed for pain.   Dyslipidemia -- Continue Crestor 10 mg p.o. daily   GERD -- Continue PPI   DVT prophylaxis:     Code Status: Full Code Family Communication: Updated family present at bedside this morning  Disposition Plan:  Level of care: Med-Surg Status is: Inpatient Remains inpatient appropriate because: IV antibiotics, awaiting urine culture susceptibilities; anticipate discharge home in 1-2 days    Consultants:  None  Procedures:  Renal transplant ultrasound  Antimicrobials:  Ceftriaxone 9/8>>   Subjective: Patient seen examined bedside, resting calmly.  Lying in bed.  No specific complaints this morning.  Remains on IV antibiotics and IV fluid.  Creatinine slowly improving.  Urine culture now growing E. coli awaiting sensitivities.  No other specific questions or concerns at this time.  Dizziness much improved.  Denies headache, no visual changes, no chest pain, no palpitations, no shortness of breath, no abdominal pain, no fever/chills/night sweats, no nausea/vomiting/diarrhea, no focal weakness, no fatigue, no paresthesias.  No acute events overnight per nursing staff.  Objective: Vitals:   09/04/23 2059 09/05/23 0100 09/05/23 0509 09/05/23 0935  BP: (!) 151/78 (!) 158/65 (!) 160/54 (!) 157/76  Pulse: 81 73 73 72  Resp: 20  19 19   Temp: 98.6 F (37 C) 98.5 F (36.9 C) 98.3 F (36.8 C) 98.3 F (36.8 C)  TempSrc: Oral  Oral Oral  SpO2: 100% 100% 98% 98%  Weight:      Height:        Intake/Output Summary (Last 24 hours) at 09/05/2023 1312 Last data filed at 09/05/2023 0830 Gross per 24 hour  Intake 840 ml  Output 1850 ml  Net -1010 ml   Filed Weights   09/03/23 2205  Weight: 88.5 kg    Examination:  Physical Exam: GEN: NAD, alert and oriented x 3, wd/wn HEENT:  NCAT, PERRL, EOMI, sclera clear, MMM PULM: CTAB w/o wheezes/crackles, normal respiratory effort CV: RRR w/o M/G/R GI: abd soft, NTND, NABS, no R/G/M MSK: no peripheral edema, noted bilateral BKA, moves all EXTR independently NEURO: CN II-XII intact, no focal deficits, sensation to light touch intact PSYCH: normal mood/affect Integumentary: dry/intact, no rashes or wounds    Data Reviewed: I have personally reviewed following labs and imaging studies  CBC: Recent Labs  Lab 09/03/23 2217 09/05/23 0826  WBC 9.8 9.5  HGB 15.2 15.4  HCT 47.8 48.2  MCV 96.6 97.4  PLT 267 262   Basic Metabolic Panel: Recent Labs  Lab 09/03/23 2217 09/05/23 0630  NA 137 137  K 3.6 5.8*  CL 107 112*  CO2 17* 13*  GLUCOSE 163* 95  BUN 16 14  CREATININE 2.11* 1.84*  CALCIUM 8.4* 8.2*   GFR: Estimated Creatinine Clearance: 52.3 mL/min (A) (by C-G formula based on SCr of 1.84 mg/dL (H)). Liver Function Tests: Recent Labs  Lab 09/03/23 2217  AST 12*  ALT 8  ALKPHOS 85  BILITOT 0.6  PROT 6.4*  ALBUMIN 2.8*   Recent Labs  Lab 09/03/23 2217  LIPASE 51   No results for input(s): "AMMONIA" in the  last 168 hours. Coagulation Profile: No results for input(s): "INR", "PROTIME" in the last 168 hours. Cardiac Enzymes: No results for input(s): "CKTOTAL", "CKMB", "CKMBINDEX", "TROPONINI" in the last 168 hours. BNP (last 3 results) No results for input(s): "PROBNP" in the last 8760 hours. HbA1C: No results for input(s): "HGBA1C" in the last 72 hours. CBG: No results for input(s): "GLUCAP" in the last 168 hours. Lipid Profile: No results for input(s): "CHOL", "HDL", "LDLCALC", "TRIG", "CHOLHDL", "LDLDIRECT" in the last 72 hours. Thyroid Function Tests: No results for input(s): "TSH", "T4TOTAL", "FREET4", "T3FREE", "THYROIDAB" in the last 72 hours. Anemia Panel: No results for input(s): "VITAMINB12", "FOLATE", "FERRITIN", "TIBC", "IRON", "RETICCTPCT" in the last 72 hours. Sepsis  Labs: Recent Labs  Lab 09/04/23 0540  LATICACIDVEN 1.1    Recent Results (from the past 240 hour(s))  Urine Culture     Status: Abnormal (Preliminary result)   Collection Time: 09/03/23 10:20 PM   Specimen: Urine, Clean Catch  Result Value Ref Range Status   Specimen Description URINE, CLEAN CATCH  Final   Special Requests NONE  Final   Culture (A)  Final    >=100,000 COLONIES/mL ESCHERICHIA COLI SUSCEPTIBILITIES TO FOLLOW Performed at [facility], [address], [place], [place] [address]    Report Status PENDING  Incomplete  Blood Culture (routine x 2)     Status: None (Preliminary result)   Collection Time: 09/04/23  5:22 AM   Specimen: BLOOD  Result Value Ref Range Status   Specimen Description BLOOD RIGHT HAND  Final   Special Requests   Final    BOTTLES DRAWN AEROBIC AND ANAEROBIC Blood Culture adequate volume   Culture   Final    NO GROWTH 1 DAY Performed at [facility], [address], [place], [place] [address]    Report Status PENDING  Incomplete  Blood Culture (routine x 2)     Status: None (Preliminary result)   Collection Time: 09/04/23  5:22 AM   Specimen: BLOOD  Result Value Ref Range Status   Specimen Description BLOOD LEFT HAND  Final   Special Requests   Final    BOTTLES DRAWN AEROBIC AND ANAEROBIC Blood Culture adequate volume   Culture   Final    NO GROWTH 1 DAY Performed at [facility], [address] [place], [place] [address]    Report Status PENDING  Incomplete         Radiology Studies: US Renal Transplant w/Doppler  Result Date: 09/04/2023 CLINICAL DATA:  Acute kidney injury EXAM: ULTRASOUND OF RENAL TRANSPLANT WITH RENAL DOPPLER ULTRASOUND TECHNIQUE: Ultrasound examination of the renal transplant was performed with gray-scale, color and duplex doppler evaluation. COMPARISON:  04/11/2018 FINDINGS: Transplant kidney location: RLQ Transplant Kidney: Renal measurements: 3.3 x 5.2 x 6.1 cm = volume: . Normal in size  and parenchymal echogenicity. No evidence of mass or hydronephrosis. No peri-transplant fluid collection seen. Color flow in the main renal artery:  Yes Color flow in the main renal vein:  Yes Duplex Doppler Evaluation: Main Renal Artery Velocity: 103.9 cm/sec Main Renal Artery Resistive Index: 0.8 Venous waveform in main renal vein:  present/absent Intrarenal resistive index in upper pole:  0.7 (normal 0.6-0.8; equivocal 0.8-0.9; abnormal >= 0.9) Intrarenal resistive index in lower pole: 0.7 (normal 0.6-0.8; equivocal 0.8-0.9; abnormal >= 0.9) Bladder: Normal for degree of bladder distention. Other findings:  None. IMPRESSION: Right lower quadrant renal transplant without hydronephrosis. Patent transplant vasculature. Electronically Signed   By: Roselie Awkward.D.  On: 09/04/2023 10:53   CT HEAD WO CONTRAST  Result Date: 09/04/2023 CLINICAL DATA:  Vertigo. EXAM: CT HEAD WITHOUT CONTRAST TECHNIQUE: Contiguous axial images were obtained from the base of the skull through the vertex without intravenous contrast. RADIATION DOSE REDUCTION: This exam was performed according to the departmental dose-optimization program which includes automated exposure control, adjustment of the mA and/or kV according to patient size and/or use of iterative reconstruction technique. COMPARISON:  06/25/2023 FINDINGS: Brain: There is no evidence for acute hemorrhage, hydrocephalus, mass lesion, or abnormal extra-axial fluid collection. No definite CT evidence for acute infarction. Vascular: No hyperdense vessel or unexpected calcification. Skull: No evidence for fracture. No worrisome lytic or sclerotic lesion. Sinuses/Orbits: The visualized paranasal sinuses and mastoid air cells are clear. Visualized portions of the globes and intraorbital fat are unremarkable. Other: None. IMPRESSION: No acute intracranial abnormality. Electronically Signed   By: Kennith Center M.D.   On: 09/04/2023 06:05        Scheduled Meds:  bisacodyl   10 mg Rectal Once   carvedilol  3.125 mg Oral BID WC   hydrALAZINE  50 mg Oral BID   mycophenolate  360 mg Oral BID   pantoprazole  40 mg Oral Daily   predniSONE  5 mg Oral Daily   rosuvastatin  10 mg Oral Daily   sodium chloride flush  3 mL Intravenous Q12H   sulfamethoxazole-trimethoprim  1 tablet Oral Q M,W,F   Continuous Infusions:  sodium chloride 100 mL/hr at 09/05/23 0552   cefTRIAXone (ROCEPHIN)  IV 2 g (09/05/23 0556)     LOS: 1 day    Time spent: 53 minutes spent on chart review, discussion with nursing staff, consultants, updating family and interview/physical exam; more than 50% of that time was spent in counseling and/or coordination of care.    Alvira Philips [place], DO [facility] Available via Epic secure chat 7am-7pm After these hours, please refer to coverage provider listed on [email or web address] 09/05/2023, 1:12 PM

## 2023-09-05 NOTE — Evaluation (Signed)
Physical Therapy Evaluation Patient Details Name: [name] MRN: [id number] DOB: 1976-06-02 Today's Date: 09/05/2023  History of Present Illness  47 y.o. male [facility] with dizziness and lightheadedness, no improvement with meclizine. Pt also with fever, urinalysis concerning for UTI. PMH includes HTN, DMII, PAD s/p bilateral BKA, renal and pancreatic transplant, depression, GERD.  Clinical Impression  Pt presents to PT with reports of recent symptoms of dizziness associated with rolling and changes in position. Pt reports that his most significant symptoms had been occurring with rolling to right side. Vestibular assessment documented below is notable for mild symptoms with VOR (no nystagmus noted). Dix hallpike to both sides is negative. Pt does report very brief and mild symptoms with rolling to left side. Pt is able to ambulate independently, asymptomatic with transfers and ambulation. Pt does report a history of dizziness in July, which resolved with getting his ears cleaned out, but he denies any further ear infection, fullness, or tinnitus. PT will continue to follow during admission, hopeful symptoms continue to improve further with time. No post-acute PT services recommended at this time, although if symptoms recur then outpatient vestibular PT may be of benefit.        If plan is discharge home, recommend the following: Assist for transportation   Can travel by private vehicle        Equipment Recommendations None recommended by PT  Recommendations for Other Services       Functional Status Assessment Patient has had a recent decline in their functional status and demonstrates the ability to make significant improvements in function in a reasonable and predictable amount of time.     Precautions / Restrictions Precautions Precautions: None Restrictions Weight Bearing Restrictions: No      Mobility  Bed Mobility Overal bed mobility:  Independent                  Transfers Overall transfer level: Independent Equipment used: None                    Ambulation/Gait Ambulation/Gait assistance: Independent Gait Distance (Feet): 150 Feet Assistive device: None Gait Pattern/deviations: Step-through pattern, Wide base of support Gait velocity: functional Gait velocity interpretation: 1.31 - 2.62 ft/sec, indicative of limited community ambulator   General Gait Details: pt with steady step-through gait, denies dizziness when mobilizing or turning  Stairs            Wheelchair Mobility     Tilt Bed    Modified Rankin (Stroke Patients Only)       Balance Overall balance assessment: Independent                                           Pertinent Vitals/Pain Pain Assessment Pain Assessment: No/denies pain    Home Living Family/patient expects to be discharged to:: Private residence Living Arrangements: Spouse/significant other Available Help at Discharge: Other (Comment);Available PRN/intermittently (significant other) Type of Home: House Home Access: Stairs to enter Entrance Stairs-Rails: None Entrance Stairs-Number of Steps: 1   Home Layout: One level Home Equipment: None      Prior Function Prior Level of Function : Independent/Modified Independent;Driving                     Extremity/Trunk Assessment   Upper Extremity Assessment Upper Extremity Assessment: Overall WFL for tasks assessed  Lower Extremity Assessment Lower Extremity Assessment: Overall WFL for tasks assessed (bilateral BKA)    Cervical / Trunk Assessment Cervical / Trunk Assessment: Normal  Communication   Communication Communication: No apparent difficulties Cueing Techniques: Verbal cues  Cognition Arousal: Alert Behavior During Therapy: WFL for tasks assessed/performed Overall Cognitive Status: Within Functional Limits for tasks assessed                                           General Comments General comments (skin integrity, edema, etc.): VSS on RA. Vestibular Assessment: pursuits and saccades WFL. VOR horizontal and vertical produce mild dizziness with saccadic corrections. Dix hallpike negative to both sides. Pt reports mild symptoms with rolling to L side during session but these quickly resolve, much less significant than symptoms pt had preceding admission.    Exercises     Assessment/Plan    PT Assessment Patient needs continued PT services  PT Problem List Decreased balance;Other (comment) (vestibular function)       PT Treatment Interventions Balance training;Neuromuscular re-education;Other (comment)    PT Goals (Current goals can be found in the Care Plan section)  Acute Rehab PT Goals Patient Stated Goal: to return home PT Goal Formulation: With patient Time For Goal Achievement: 09/19/23 Potential to Achieve Goals: Good Additional Goals Additional Goal #1: Pt will deny dizziness when performing bed mobility and transfers Additional Goal #2: Pt will be able to perform VOR x1 exercise for >1 minute without symptoms of dizziness    Frequency Min 1X/week     Co-evaluation               AM-PAC PT "6 Clicks" Mobility  Outcome Measure Help needed turning from your back to your side while in a flat bed without using bedrails?: None Help needed moving from lying on your back to sitting on the side of a flat bed without using bedrails?: None Help needed moving to and from a bed to a chair (including a wheelchair)?: None Help needed standing up from a chair using your arms (e.g., wheelchair or bedside chair)?: None Help needed to walk in hospital room?: None Help needed climbing 3-5 steps with a railing? : None 6 Click Score: 24    End of Session   Activity Tolerance: Patient tolerated treatment well Patient left: in bed;with call bell/phone within reach Nurse Communication: Mobility status PT Visit  Diagnosis: Other symptoms and signs involving the nervous system (R29.898)    Time: 4696-2952 PT Time Calculation (min) (ACUTE ONLY): 24 min   Charges:   PT Evaluation $PT Eval Low Complexity: 1 Low   PT General Charges $$ ACUTE PT VISIT: 1 Visit         Arlyss Gandy, PT, DPT Acute Rehabilitation Office [phone]   Arlyss Gandy 09/05/2023, 12:48 PM

## 2023-09-06 DIAGNOSIS — B9620 Unspecified Escherichia coli [E. coli] as the cause of diseases classified elsewhere: Secondary | ICD-10-CM | POA: Diagnosis not present

## 2023-09-06 DIAGNOSIS — N390 Urinary tract infection, site not specified: Secondary | ICD-10-CM | POA: Diagnosis not present

## 2023-09-06 LAB — BASIC METABOLIC PANEL
Anion gap: 10 (ref 5–15)
BUN: 10 mg/dL (ref 6–20)
CO2: 19 mmol/L — ABNORMAL LOW (ref 22–32)
Calcium: 8.3 mg/dL — ABNORMAL LOW (ref 8.9–10.3)
Chloride: 111 mmol/L (ref 98–111)
Creatinine, Ser: 1.83 mg/dL — ABNORMAL HIGH (ref 0.61–1.24)
GFR, Estimated: 45 mL/min — ABNORMAL LOW (ref >60)
Glucose, Bld: 117 mg/dL — ABNORMAL HIGH (ref 70–99)
Potassium: 3.1 mmol/L — ABNORMAL LOW (ref 3.5–5.1)
Sodium: 140 mmol/L (ref 135–145)

## 2023-09-06 LAB — URINE CULTURE: Culture: >=100000 — AB

## 2023-09-06 MED ORDER — CARVEDILOL 6.25 MG PO TABS
6.2500 mg | ORAL_TABLET | Freq: Two times a day (BID) | ORAL | Status: DC
  Administered 2023-09-06: 6.25 mg via ORAL
  Filled 2023-09-06: qty 1

## 2023-09-06 MED ORDER — POTASSIUM CHLORIDE CRYS ER 20 MEQ PO TBCR
40.0000 meq | EXTENDED_RELEASE_TABLET | Freq: Once | ORAL | Status: AC
  Administered 2023-09-06: 40 meq via ORAL
  Filled 2023-09-06: qty 2

## 2023-09-06 MED ORDER — CARVEDILOL 6.25 MG PO TABS: 6.2500 mg | ORAL_TABLET | Freq: Two times a day (BID) | ORAL | 0 refills | Status: DC

## 2023-09-06 MED ORDER — CEPHALEXIN 500 MG PO CAPS: 500.0000 mg | ORAL_CAPSULE | Freq: Two times a day (BID) | ORAL | 0 refills | Status: AC

## 2023-09-06 NOTE — Plan of Care (Signed)
  Problem: Education: Goal: Knowledge of General Education information will improve Description: Including pain rating scale, medication(s)/side effects and non-pharmacologic comfort measures 09/06/2023 0436 by Loni Dolly, RN Outcome: Progressing 09/06/2023 0436 by Loni Dolly, RN Outcome: Not Progressing   Problem: Health Behavior/Discharge Planning: Goal: Ability to manage health-related needs will improve 09/06/2023 0436 by Loni Dolly, RN Outcome: Progressing 09/06/2023 0436 by Loni Dolly, RN Outcome: Not Progressing   Problem: Clinical Measurements: Goal: Ability to maintain clinical measurements within normal limits will improve 09/06/2023 0436 by Loni Dolly, RN Outcome: Progressing 09/06/2023 0436 by Loni Dolly, RN Outcome: Not Progressing Goal: Will remain free from infection 09/06/2023 0436 by Loni Dolly, RN Outcome: Progressing 09/06/2023 0436 by Loni Dolly, RN Outcome: Not Progressing Goal: Diagnostic test results will improve 09/06/2023 0436 by Loni Dolly, RN Outcome: Progressing 09/06/2023 0436 by Loni Dolly, RN Outcome: Not Progressing Goal: Respiratory complications will improve 09/06/2023 0436 by Loni Dolly, RN Outcome: Progressing 09/06/2023 0436 by Loni Dolly, RN Outcome: Not Progressing Goal: Cardiovascular complication will be avoided 09/06/2023 0436 by Loni Dolly, RN Outcome: Progressing 09/06/2023 0436 by Loni Dolly, RN Outcome: Not Progressing   Problem: Nutrition: Goal: Adequate nutrition will be maintained 09/06/2023 0436 by Loni Dolly, RN Outcome: Progressing 09/06/2023 0436 by Loni Dolly, RN Outcome: Not Progressing   Problem: Coping: Goal: Level of anxiety will decrease 09/06/2023 0436 by Loni Dolly, RN Outcome: Progressing 09/06/2023 0436 by Loni Dolly, RN Outcome: Not Progressing   Problem: Skin  Integrity: Goal: Risk for impaired skin integrity will decrease 09/06/2023 0436 by Loni Dolly, RN Outcome: Progressing 09/06/2023 0436 by Loni Dolly, RN Outcome: Not Progressing

## 2023-09-06 NOTE — TOC Transition Note (Signed)
Transition of Care [facility]) - CM/SW Discharge Note   Patient Details  Name: [name] MRN: [id number] Date of Birth: 01-08-1976  [facility] CM/SW Contact:  Tom-Johnson, Hershal Coria, RN Phone Number: 09/06/2023, 12:12 PM   Clinical Narrative:     Patient is scheduled for discharge today.  Readmission Risk Assessment done. Outpatient referral, hospital f/u and discharge instructions on AVS. No TOC needs or recommendations and no PT f/u noted. Significant Other, Burna Mortimer to transport at discharge.  No further TOC needs noted.       Final next level of care: Home/Self Care Barriers to Discharge: Barriers Resolved   Patient Goals and CMS Choice CMS Medicare.gov Compare Post Acute Care list provided to:: Patient Choice offered to / list presented to : NA  Discharge Placement                  Patient to be transferred to facility by: Significant Other Name of family member notified: Sherle Poe    Discharge Plan and Services Additional resources added to the After Visit Summary for                  DME Arranged: N/A DME Agency: NA       HH Arranged: NA HH Agency: NA        Social Determinants of Health (SDOH) Interventions SDOH Screenings   Food Insecurity: No Food Insecurity (09/04/2023)  Housing: Low Risk  (09/04/2023)  Transportation Needs: No Transportation Needs (09/04/2023)  Utilities: Not At Risk (09/04/2023)  Alcohol Screen: Low Risk  (09/06/2022)  Depression (PHQ2-9): Low Risk  (09/06/2022)  Financial Resource Strain: Low Risk  (09/06/2022)  Physical Activity: Inactive (09/06/2022)  Social Connections: Socially Integrated (09/06/2022)  Stress: No Stress Concern Present (09/06/2022)  Tobacco Use: Medium Risk (09/03/2023)     Readmission Risk Interventions    09/06/2023   12:10 PM  Readmission Risk Prevention Plan  Transportation Screening Complete  PCP or Specialist Appt within 5-7 Days Complete  Home Care Screening Complete   Medication Review (RN CM) Referral to Pharmacy

## 2023-09-06 NOTE — Plan of Care (Signed)

## 2023-09-06 NOTE — Discharge Summary (Signed)
Physician Discharge Summary  [name] MVH:[id number] DOB: October 24, 1976 DOA: 09/03/2023  PCP: [name], MD  Admit date: 09/03/2023 Discharge date: 09/06/2023  Admitted From: Home Disposition: Home  Recommendations for Outpatient Follow-up:  Follow up with PCP in 1-2 weeks Continue Keflex to complete treatment course for E. coli urinary tract infection Increase carvedilol to 6.25 mg p.o. twice daily for poorly controlled hypertension. Please obtain BMP in one week to reassess creatinine/renal function Continue to monitor blood pressure outpatient and may need further adjustments in antihypertensive regimen.  Home Health: No Equipment/Devices: None  Discharge Condition: Stable CODE STATUS: Full code Diet recommendation: Heart healthy diet  History of present illness:  [name] is a 47 y.o. 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  Anytime the patient gets up or turns his head certain ways he feels like the room spinning around him.  Also reported having lightheadedness like he could pass out, but denies having any falls or loss of consciousness.  Patient was prescribed meclizine by his primary, but he reports no improvement in symptoms with taking the medication.  He initially had decreased appetite, but reports it has returned.  Associated symptoms included cloudy appearance of urine and foul odor that has also been present.  Denies having any significant fever, nausea, vomiting, abdominal pain, flank pain, or dysuria.     He has been admitted in the hospital with similar symptoms back in July found to have a E. coli urinary tract infection that was susceptible to everything except for Bactrim.    In the emergency  department patient was noted to have a temperature of 100.2 F with blood pressures elevated up to 189/80, and all other vital signs maintained.  Labs from 9/8 significant for WBC 9.8, lactic acid 1.1, creatinine 2.11, BUN 16, and glucose 163.  CT scan of the head did not note any acute abnormality.  Urinalysis positive for moderate, large leukocytes, few bacteria, and greater than 50 WBCs.  Patient having given 1 L lactated Ringer's and Rocephin 1 g IV.  EDP consulted [facility] for admission for further evaluation management of UTI, dizziness.  Hospital course:  E. coli urinary tract infection Patient reports having cloudy urine with foul odor.  Urinalysis positive for moderate, large leukocytes, few bacteria, and greater than 50 WBCs.  Last admitted into the hospital back in July with an E. coli  urinary tract infection that was resistant only to Bactrim.  Patient was initially placed on ceftriaxone and will discharge on Keflex 500 mg p.o. twice daily to complete 10-day course given his immunocompromise state.  Outpatient follow-up with PCP.   Acute kidney injury superimposed on chronic kidney disease IIIa On admission creatinine elevated at 2.11 with BUN 16.  Creatinine previously noted to range around 1.4-1.7.  Renal transplant ultrasound with right lower quadrant renal transplant without hydronephrosis, patent transplant vasculature.  Patient's creatinine improved to 1.83 at time of discharge.  Recommend repeat BMP 1 week.   Hyperkalemia: Resolved Potassium 5.8, likely secondary to acute renal failure.  Treated with Lokelma.  Repeat BMP 1 week.   Dizziness: Resolved Patient reports room spinning around him with any kind of movement, but also reported lightheadedness over the last 2 weeks.  Meclizine was ineffective.  Blood pressures were noted to be elevated.  CT scan of the head did not note any acute abnormality.  Question possibility of BPPV.  Seen by physical therapy for vestibular assessment, no  nystagmus noted.   Hypertensive urgency Blood pressures initially elevated up to 189/80.  Bentylol was increased to 6.25 mg p.o. twice daily.  Continue hydralazine 50 mg p.o. twice daily.  Outpatient follow-up with PCP.   Status post renal and pancreatic transplant on chronic immunosuppression Patient status post renal and pancreatic transplant back in 2014.  Patient on mycophenolate 360 mg twice daily and belatacept infusions. Continue prednisone 5 mg p.o. daily, mycophenolate 360 mg p.o. twice daily, and Bactrim every Monday/Wednesday/Friday   Controlled diabetes mellitus type 2, without long-term use of insulin Admission glucose mildly elevated at 160.  Last hemoglobin A1c was 5.9 on 05/18/2023.  Patient's status post pancreatic transplant for which diabetes is well-controlled.   PAD Patient's status post bilateral below-knee amputations.   Chronic pain syndrome Continue oxycodone as needed for pain.   Dyslipidemia Continue Crestor 10 mg p.o. daily   GERD Continue PPI  Discharge Diagnoses:  Principal Problem:   UTI (urinary tract infection) Active Problems:   Acute kidney injury superimposed on chronic kidney disease (HCC)   Dizziness   Hypertensive urgency   Renal transplant, status post   Immunosuppressed status (HCC)   Diabetes mellitus type II, controlled (HCC)   PAD (peripheral artery disease) (HCC)   Chronic pain   Dyslipidemia, goal LDL below 100   GERD    Discharge Instructions  Discharge Instructions     Call MD for:  difficulty breathing, headache or visual disturbances   Complete by: As directed    Call MD for:  extreme fatigue   Complete by: As directed    Call MD for:  persistant dizziness or light-headedness   Complete by: As directed    Call MD for:  persistant nausea and vomiting   Complete by: As directed    Call MD for:  severe uncontrolled pain   Complete by: As directed    Call MD for:  temperature >100.4   Complete by: As directed    Diet  - low sodium heart healthy   Complete by: As directed    Increase activity slowly   Complete by: As directed       Allergies as of 09/06/2023       Reactions   Amlodipine Rash   Lisinopril Rash        Medication List     TAKE these medications    belatacept 250 MG Solr injection Commonly known as: NULOJIX Inject into the vein every 30 (thirty) days.   carvedilol 6.25 MG tablet Commonly known as: COREG Take 1 tablet (6.25 mg total) by mouth 2 (two) times daily with a meal. What changed:  medication strength how much to take   cephALEXin 500 MG capsule Commonly known as: KEFLEX Take 1 capsule (500 mg total) by mouth 2 (two) times daily for 7 days.   ergocalciferol 1.25 MG (50000 UT) capsule Commonly known as: VITAMIN D2 Take 1 capsule (50,000 Units total) by mouth once a week. What changed: additional instructions   fluocinonide cream 0.05 % Commonly known as: LIDEX Apply one application to affected areas twice daily   hydrALAZINE 50 MG tablet Commonly known as: APRESOLINE Take 1 tablet (50 mg total) by mouth 4 (four) times daily. What changed: when to take this   meclizine 25 MG tablet Commonly known as: ANTIVERT Take 25 mg by mouth 3 (three)  times daily as needed for dizziness or nausea.   mycophenolate 180 MG EC tablet Commonly known as: MYFORTIC Take 360 mg by mouth 2 (two) times daily.   omeprazole 40 MG capsule Commonly known as: PRILOSEC Take 40 mg by mouth daily.   predniSONE 5 MG tablet Commonly known as: DELTASONE Take 5 mg by mouth daily.   rosuvastatin 10 MG tablet Commonly known as: Crestor Take 1 tablet (10 mg total) by mouth daily.   sulfamethoxazole-trimethoprim 400-80 MG tablet Commonly known as: BACTRIM Take 1 tablet by mouth every Monday, Wednesday, and Friday.        Follow-up Information     [name], MD. Schedule an appointment as soon as possible for a visit in 1 week(s).   Specialty: Internal Medicine Contact  information: [address] [place] [address] [phone]                Allergies  Allergen Reactions   Amlodipine Rash   Lisinopril Rash    Consultations: None   Procedures/Studies: US Renal Transplant w/Doppler  Result Date: 09/04/2023 CLINICAL DATA:  Acute kidney injury EXAM: ULTRASOUND OF RENAL TRANSPLANT WITH RENAL DOPPLER ULTRASOUND TECHNIQUE: Ultrasound examination of the renal transplant was performed with gray-scale, color and duplex doppler evaluation. COMPARISON:  04/11/2018 FINDINGS: Transplant kidney location: RLQ Transplant Kidney: Renal measurements: 3.3 x 5.2 x 6.1 cm = volume: . Normal in size and parenchymal echogenicity. No evidence of mass or hydronephrosis. No peri-transplant fluid collection seen. Color flow in the main renal artery:  Yes Color flow in the main renal vein:  Yes Duplex Doppler Evaluation: Main Renal Artery Velocity: 103.9 cm/sec Main Renal Artery Resistive Index: 0.8 Venous waveform in main renal vein:  present/absent Intrarenal resistive index in upper pole:  0.7 (normal 0.6-0.8; equivocal 0.8-0.9; abnormal >= 0.9) Intrarenal resistive index in lower pole: 0.7 (normal 0.6-0.8; equivocal 0.8-0.9; abnormal >= 0.9) Bladder: Normal for degree of bladder distention. Other findings:  None. IMPRESSION: Right lower quadrant renal transplant without hydronephrosis. Patent transplant vasculature. Electronically Signed   By: Charline Bills M.D.   On: 09/04/2023 10:53   CT HEAD WO CONTRAST  Result Date: 09/04/2023 CLINICAL DATA:  Vertigo. EXAM: CT HEAD WITHOUT CONTRAST TECHNIQUE: Contiguous axial images were obtained from the base of the skull through the vertex without intravenous contrast. RADIATION DOSE REDUCTION: This exam was performed according to the departmental dose-optimization program which includes automated exposure control, adjustment of the mA and/or kV according to patient size and/or use of iterative reconstruction technique.  COMPARISON:  06/25/2023 FINDINGS: Brain: There is no evidence for acute hemorrhage, hydrocephalus, mass lesion, or abnormal extra-axial fluid collection. No definite CT evidence for acute infarction. Vascular: No hyperdense vessel or unexpected calcification. Skull: No evidence for fracture. No worrisome lytic or sclerotic lesion. Sinuses/Orbits: The visualized paranasal sinuses and mastoid air cells are clear. Visualized portions of the globes and intraorbital fat are unremarkable. Other: None. IMPRESSION: No acute intracranial abnormality. Electronically Signed   By: Kennith Center M.D.   On: 09/04/2023 06:05     Subjective: Patient seen examined bedside, resting calmly.  Lying in bed.  Family present.  Urine culture now finalized with sensitivities present.  Will discharge on Keflex.  Patient with no other complaints or concerns at this time.  Dizziness has resolved.  Further denies headache, no visual changes, no chest pain, no palpitations, no shortness of breath, no abdominal pain, no fever/chills/night sweats, no nausea/vomitus diarrhea, no focal weakness, no fatigue, no paresthesias.  No acute events overnight per nursing.  Discharge Exam: Vitals:   09/06/23 0512 09/06/23 0821  BP: (!) 174/77 (!) 172/80  Pulse: 75 80  Resp: 20 17  Temp: 98.4 F (36.9 C) 98.6 F (37 C)  SpO2: 94% 91%   Vitals:   09/05/23 1820 09/05/23 2226 09/06/23 0512 09/06/23 0821  BP: (!) 172/82 (!) 150/76 (!) 174/77 (!) 172/80  Pulse: 84 80 75 80  Resp:  17 20 17   Temp:   98.4 F (36.9 C) 98.6 F (37 C)  TempSrc:      SpO2: 100% 98% 94% 91%  Weight:      Height:        Physical Exam: GEN: NAD, alert and oriented x 3, wd/wn HEENT: NCAT, PERRL, EOMI, sclera clear, MMM PULM: CTAB w/o wheezes/crackles, normal respiratory effort CV: RRR w/o M/G/R GI: abd soft, NTND, NABS, no R/G/M MSK: no peripheral edema, noted bilateral BKA, moves all extremities independently NEURO: CN II-XII intact, no focal deficits,  sensation to light touch intact PSYCH: normal mood/affect Integumentary: dry/intact, no rashes or wounds    The results of significant diagnostics from this hospitalization (including imaging, microbiology, ancillary and laboratory) are listed below for reference.     Microbiology: Recent Results (from the past 240 hour(s))  Urine Culture     Status: Abnormal   Collection Time: 09/03/23 10:20 PM   Specimen: Urine, Clean Catch  Result Value Ref Range Status   Specimen Description URINE, CLEAN CATCH  Final   Special Requests   Final    NONE Performed at [facility], [address], [place], [place] [address]    Culture >=100,000 COLONIES/mL ESCHERICHIA COLI (A)  Final   Report Status 09/06/2023 FINAL  Final   Organism ID, Bacteria ESCHERICHIA COLI (A)  Final      Susceptibility   Escherichia coli - MIC*    AMPICILLIN >=32 RESISTANT Resistant     CEFAZOLIN <=4 SENSITIVE Sensitive     CEFEPIME <=0.12 SENSITIVE Sensitive     CEFTRIAXONE <=0.25 SENSITIVE Sensitive     CIPROFLOXACIN <=0.25 SENSITIVE Sensitive     GENTAMICIN <=1 SENSITIVE Sensitive     IMIPENEM <=0.25 SENSITIVE Sensitive     NITROFURANTOIN <=16 SENSITIVE Sensitive     TRIMETH/SULFA >=320 RESISTANT Resistant     AMPICILLIN/SULBACTAM >=32 RESISTANT Resistant     PIP/TAZO <=4 SENSITIVE Sensitive     * >=100,000 COLONIES/mL ESCHERICHIA COLI  Blood Culture (routine x 2)     Status: None (Preliminary result)   Collection Time: 09/04/23  5:22 AM   Specimen: BLOOD  Result Value Ref Range Status   Specimen Description BLOOD RIGHT HAND  Final   Special Requests   Final    BOTTLES DRAWN AEROBIC AND ANAEROBIC Blood Culture adequate volume   Culture   Final    NO GROWTH 2 DAYS Performed at [facility], [address] [place], [place] [address]    Report Status PENDING  Incomplete  Blood Culture (routine x 2)     Status: None (Preliminary result)   Collection Time: 09/04/23  5:22 AM   Specimen: BLOOD   Result Value Ref Range Status   Specimen Description BLOOD LEFT HAND  Final   Special Requests   Final    BOTTLES DRAWN AEROBIC AND ANAEROBIC Blood Culture adequate volume   Culture   Final    NO GROWTH 2 DAYS Performed at [facility], [address] [place], [place] [address]  Report Status PENDING  Incomplete     Labs: BNP (last 3 results) No results for input(s): "BNP" in the last 8760 hours. Basic Metabolic Panel: Recent Labs  Lab 09/03/23 2217 09/05/23 0630 09/06/23 0603  NA 137 137 140  K 3.6 5.8* 3.1*  CL 107 112* 111  CO2 17* 13* 19*  GLUCOSE 163* 95 117*  BUN 16 14 10   CREATININE 2.11* 1.84* 1.83*  CALCIUM 8.4* 8.2* 8.3*   Liver Function Tests: Recent Labs  Lab 09/03/23 2217  AST 12*  ALT 8  ALKPHOS 85  BILITOT 0.6  PROT 6.4*  ALBUMIN 2.8*   Recent Labs  Lab 09/03/23 2217  LIPASE 51   No results for input(s): "AMMONIA" in the last 168 hours. CBC: Recent Labs  Lab 09/03/23 2217 09/05/23 0826  WBC 9.8 9.5  HGB 15.2 15.4  HCT 47.8 48.2  MCV 96.6 97.4  PLT 267 262   Cardiac Enzymes: No results for input(s): "CKTOTAL", "CKMB", "CKMBINDEX", "TROPONINI" in the last 168 hours. BNP: Invalid input(s): "POCBNP" CBG: No results for input(s): "GLUCAP" in the last 168 hours. D-Dimer No results for input(s): "DDIMER" in the last 72 hours. Hgb A1c No results for input(s): "HGBA1C" in the last 72 hours. Lipid Profile No results for input(s): "CHOL", "HDL", "LDLCALC", "TRIG", "CHOLHDL", "LDLDIRECT" in the last 72 hours. Thyroid function studies No results for input(s): "TSH", "T4TOTAL", "T3FREE", "THYROIDAB" in the last 72 hours.  Invalid input(s): "FREET3" Anemia work up No results for input(s): "VITAMINB12", "FOLATE", "FERRITIN", "TIBC", "IRON", "RETICCTPCT" in the last 72 hours. Urinalysis    Component Value Date/Time   COLORURINE YELLOW 09/03/2023 2220   APPEARANCEUR CLOUDY (A) 09/03/2023 2220   LABSPEC 1.005 09/03/2023 2220    PHURINE 7.0 09/03/2023 2220   GLUCOSEU NEGATIVE 09/03/2023 2220   GLUCOSEU NEGATIVE 01/10/2022 1019   HGBUR MODERATE (A) 09/03/2023 2220   HGBUR negative 10/08/2010 0853   BILIRUBINUR NEGATIVE 09/03/2023 2220   BILIRUBINUR SMALL 12/14/2015 1139   KETONESUR NEGATIVE 09/03/2023 2220   PROTEINUR 30 (A) 09/03/2023 2220   UROBILINOGEN 0.2 01/10/2022 1019   NITRITE NEGATIVE 09/03/2023 2220   LEUKOCYTESUR LARGE (A) 09/03/2023 2220   Sepsis Labs Recent Labs  Lab 09/03/23 2217 09/05/23 0826  WBC 9.8 9.5   Microbiology Recent Results (from the past 240 hour(s))  Urine Culture     Status: Abnormal   Collection Time: 09/03/23 10:20 PM   Specimen: Urine, Clean Catch  Result Value Ref Range Status   Specimen Description URINE, CLEAN CATCH  Final   Special Requests   Final    NONE Performed at [facility], [address], [place], [place] [address]    Culture >=100,000 COLONIES/mL ESCHERICHIA COLI (A)  Final   Report Status 09/06/2023 FINAL  Final   Organism ID, Bacteria ESCHERICHIA COLI (A)  Final      Susceptibility   Escherichia coli - MIC*    AMPICILLIN >=32 RESISTANT Resistant     CEFAZOLIN <=4 SENSITIVE Sensitive     CEFEPIME <=0.12 SENSITIVE Sensitive     CEFTRIAXONE <=0.25 SENSITIVE Sensitive     CIPROFLOXACIN <=0.25 SENSITIVE Sensitive     GENTAMICIN <=1 SENSITIVE Sensitive     IMIPENEM <=0.25 SENSITIVE Sensitive     NITROFURANTOIN <=16 SENSITIVE Sensitive     TRIMETH/SULFA >=320 RESISTANT Resistant     AMPICILLIN/SULBACTAM >=32 RESISTANT Resistant     PIP/TAZO <=4 SENSITIVE Sensitive     * >=100,000 COLONIES/mL ESCHERICHIA COLI  Blood Culture (routine x 2)  Status: None (Preliminary result)   Collection Time: 09/04/23  5:22 AM   Specimen: BLOOD  Result Value Ref Range Status   Specimen Description BLOOD RIGHT HAND  Final   Special Requests   Final    BOTTLES DRAWN AEROBIC AND ANAEROBIC Blood Culture adequate volume   Culture   Final    NO GROWTH 2  DAYS Performed at [facility], [address] [place], [place] [address]    Report Status PENDING  Incomplete  Blood Culture (routine x 2)     Status: None (Preliminary result)   Collection Time: 09/04/23  5:22 AM   Specimen: BLOOD  Result Value Ref Range Status   Specimen Description BLOOD LEFT HAND  Final   Special Requests   Final    BOTTLES DRAWN AEROBIC AND ANAEROBIC Blood Culture adequate volume   Culture   Final    NO GROWTH 2 DAYS Performed at [facility], [address], [place], [place] [address]    Report Status PENDING  Incomplete     Time coordinating discharge: Over 30 minutes  SIGNED:   Alvira Philips [place], DO  [facility] 09/06/2023, 9:53 AM

## 2023-09-06 NOTE — Discharge Instructions (Signed)
 FURTHER DISCHARGE INSTRUCTIONS:   Get Medicines reviewed and adjusted: Please take all your medications with you for your next visit with your Primary MD   Laboratory/radiological data: Please request your Primary MD to go over all hospital tests and procedure/radiological results at the follow up, please ask your Primary MD to get all Hospital records sent to his/her office.   In some cases, they will be blood work, cultures and biopsy results pending at the time of your discharge. Please request that your primary care M.D. goes through all the records of your hospital data and follows up on these results.   Also Note the following: If you experience worsening of your admission symptoms, develop shortness of breath, life threatening emergency, suicidal or homicidal thoughts you must seek medical attention immediately by calling 911 or calling your MD immediately  if symptoms less severe.   You must read complete instructions/literature along with all the possible adverse reactions/side effects for all the Medicines you take and that have been prescribed to you. Take any new Medicines after you have completely understood and accpet all the possible adverse reactions/side effects.    Do not drive when taking Pain medications or sleeping medications (Benzodaizepines)   Do not take more than prescribed Pain, Sleep and Anxiety Medications. It is not advisable to combine anxiety,sleep and pain medications without talking with your primary care practitioner   Special Instructions: If you have smoked or chewed Tobacco  in the last 2 yrs please stop smoking, stop any regular Alcohol  and or any Recreational drug use.   Wear Seat belts while driving.   Please note: You were cared for by a hospitalist during your hospital stay. Once you are discharged, your primary care physician will handle any further medical issues. Please note that NO REFILLS for any discharge medications will be authorized once  you are discharged, as it is imperative that you return to your primary care physician (or establish a relationship with a primary care physician if you do not have one) for your post hospital discharge needs so that they can reassess your need for medications and monitor your lab values.

## 2023-09-06 NOTE — Consult Note (Signed)
   [facility]  [facility] Inpatient Consult   09/06/2023  [name] 1976/10/13 [id number]  Primary Care Provider:  [name], MD with [place] [facility] Liaison met patient at bedside at [facility]. Met with patient patient endorses PCP, denies any needs for SDOH as reviewed.  Explained regarding post hospital telephonic follow up with Community TOC.  Insurance: [employer] [Dual]   The patient was screened for hospitalization with noted rising risk score for unplanned readmission risk with 2 admissions in 6 months.  The patient was assessed for potential [facility]) Care Management service needs for post hospital transition for care coordination. Review of patient's electronic medical record reveals patient is from home and for potential discharge today. Patient states no current needs just "waiting on the cultures to come back."  Plan:RN Hospital Liaison will continue to follow progress and disposition to asess for post hospital community care coordination/management needs.  Referral request for community care coordination: anticipate [facility] Transitions of Care Team follow up.   [facility] Care Management/Population Health does not replace or interfere with any arrangements made by the Inpatient Transition of Care team.   For questions contact:   Charlesetta Shanks, RN, BSN, CCM [place]  [facility], [facility] Liaison Direct Dial: [phone] or secure chat Website: [email or web address]

## 2023-09-06 NOTE — Progress Notes (Signed)
Discharge instructions (including medications) discussed with and copy provided to patient/caregiver 

## 2023-09-07 ENCOUNTER — Telehealth: Payer: Self-pay

## 2023-09-07 NOTE — Transitions of Care (Post Inpatient/ED Visit) (Signed)
   09/07/2023  Name: [name] MRN: [id number] DOB: [birth year]  Today's TOC FU Call Status: Today's TOC FU Call Status:: Unsuccessful Call (1st Attempt) Unsuccessful Call (1st Attempt) Date: 09/07/23  Attempted to reach the patient regarding the most recent Inpatient/ED visit.  Follow Up Plan: Additional outreach attempts will be made to reach the patient to complete the Transitions of Care (Post Inpatient/ED visit) call.   Antionette Fairy, RN,BSN,CCM [facility] Care Management Care Management Community Coordinator Direct Phone: [phone] Toll Free: [phone] Fax: [phone]

## 2023-09-08 ENCOUNTER — Telehealth: Payer: Self-pay

## 2023-09-08 NOTE — Transitions of Care (Post Inpatient/ED Visit) (Signed)
09/08/2023  Name: [name] MRN: [id number] DOB: [birth year]  Today's TOC FU Call Status: Today's TOC FU Call Status:: Successful TOC FU Call Completed TOC FU Call Complete Date: 09/08/23 Patient's Name and Date of Birth confirmed.  Transition Care Management Follow-up Telephone Call Date of Discharge: 09/06/23 Discharge Facility: [facility]) Type of Discharge: Inpatient Admission Primary Inpatient Discharge Diagnosis:: "dizziness" How have you been since you were released from the hospital?: Better (Pt voices that he is doing okay. 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 Appetite fair. Denies any dizziness-has not checked BP today yet.) Any questions or concerns?: No  Items Reviewed: Did you receive and understand the discharge instructions provided?: Yes Medications obtained,verified, and reconciled?: Yes (Medications Reviewed) (pt states renal MD prescribed Reglan & Zofran for him and he has been taking it-pt denies taking Crestor-states he was never told he had issues with cholesterol-will discuss meds with his provider) Any new allergies since your discharge?: No Dietary orders reviewed?: Yes Type of Diet Ordered:: low salt/heart healthy Do you have support at home?: Yes People in Home: spouse Name of Support/Comfort Primary Source: pt states wife is a Interior and spatial designer of nursing and assists with his medical care  Medications Reviewed Today: Medications Reviewed Today     Reviewed by Charlyn Minerva, RN (Registered Nurse) on 09/08/23 at 1018  Med List Status: <None>   Medication Order Taking? Sig Documenting Provider Last Dose Status Informant  belatacept (NULOJIX) 250 MG SOLR injection [id number] Yes Inject into the vein every 30 (thirty) days. [provider] Taking Active Self, Pharmacy Records  carvedilol (COREG) 6.25 MG tablet [id number] Yes Take 1 tablet (6.25 mg total) by  mouth 2 (two) times daily with a meal. [place], Alvira Philips, DO Taking Active   cephALEXin (KEFLEX) 500 MG capsule [id number] Yes Take 1 capsule (500 mg total) by mouth 2 (two) times daily for 7 days. [place], Alvira Philips, DO Taking Active   ergocalciferol (VITAMIN D2) 1.25 MG (50000 UT) capsule [id number] Yes Take 1 capsule (50,000 Units total) by mouth once a week.  Patient taking differently: Take 1 capsule by mouth once a week. Wednesday    Taking Active Self, Pharmacy Records  fluocinonide cream (LIDEX) 0.05 % [id number] Yes Apply one application to affected areas twice daily  Taking Active Self, Pharmacy Records  hydrALAZINE (APRESOLINE) 50 MG tablet [id number] Yes Take 1 tablet (50 mg total) by mouth 4 (four) times daily.  Patient taking differently: Take 50 mg by mouth 2 (two) times daily.   [name], MD Taking Active Self, Pharmacy Records  meclizine (ANTIVERT) 25 MG tablet [id number]  Take 25 mg by mouth 3 (three) times daily as needed for dizziness or nausea. [provider]  Active Self, Pharmacy Records  metoCLOPramide (REGLAN) 10 MG tablet [id number] Yes Take 10 mg by mouth 2 (two) times daily. [provider] Taking Active Self  mycophenolate (MYFORTIC) 180 MG EC tablet [id number] Yes Take 360 mg by mouth 2 (two) times daily. [provider] Taking Active Self, Pharmacy Records  omeprazole (PRILOSEC) 40 MG capsule [id number] Yes Take 40 mg by mouth daily. [provider] Taking Active Self, Pharmacy Records  ondansetron (ZOFRAN) 4 MG tablet [id number] Yes Take 4 mg by mouth 2 (two) times daily as needed for nausea or vomiting. [provider] Taking Active Self  predniSONE (DELTASONE) 5 MG tablet [id number] Yes Take 5 mg by mouth daily. [provider] Taking Active Self, Pharmacy Records  rosuvastatin (CRESTOR) 10 MG tablet [id number] No Take 1 tablet (10 mg total) by mouth daily.  Patient not taking: Reported on 09/08/2023   [name], MD  Not Taking Active Self, Pharmacy Records  sulfamethoxazole-trimethoprim (BACTRIM,SEPTRA) 400-80 MG per tablet [id number] Yes Take 1 tablet by mouth every Monday, Wednesday, and Friday.  [provider] Taking Active Self, Pharmacy Records           Med Note [facility], Cathe Mons Jul 04, 2023  5:54 AM)    Med List Note Danie Binder 03/14/13 2044): tue thu sat dialysis            Home Care and Equipment/Supplies: Were Home Health Services Ordered?: NA Any new equipment or medical supplies ordered?: NA  Functional Questionnaire: Do you need assistance with bathing/showering or dressing?: No Do you need assistance with meal preparation?: No Do you need assistance with eating?: No Do you have difficulty maintaining continence: No Do you need assistance with getting out of bed/getting out of a chair/moving?: No Do you have difficulty managing or taking your medications?: No  Follow up appointments reviewed: PCP Follow-up appointment confirmed?: Yes Date of PCP follow-up appointment?: 09/18/23 Follow-up Provider: Dr. Yetta Barre Specialist [facility] Follow-up appointment confirmed?: Yes Date of Specialist follow-up appointment?: 09/11/23 Follow-Up Specialty Provider:: Dr. Kathrene Bongo Do you need transportation to your follow-up appointment?: No Do you understand care options if your condition(s) worsen?: Yes-patient verbalized understanding   TOC Interventions Today    Flowsheet Row Most Recent Value  TOC Interventions   TOC Interventions Discussed/Reviewed TOC Interventions Discussed, S/S of infection, Arranged PCP follow up less than 12 days/Care Guide scheduled      Interventions Today    Flowsheet Row Most Recent Value  Chronic Disease   Chronic disease during today's visit Chronic Kidney Disease/End Stage Renal Disease (ESRD)  Judeth Porch Tx]  General Interventions   General Interventions Discussed/Reviewed General Interventions Discussed, Doctor Visits  Doctor  Visits Discussed/Reviewed Doctor Visits Discussed, PCP, Specialist  PCP/Specialist Visits Compliance with follow-up visit  Education Interventions   Education Provided Provided Education  Provided Verbal Education On Nutrition, Medication, When to see the doctor  Nutrition Interventions   Nutrition Discussed/Reviewed Nutrition Discussed, Decreasing salt, Adding fruits and vegetables, Decreasing sugar intake, Increasing proteins, Decreasing fats, Fluid intake  Pharmacy Interventions   Pharmacy Dicussed/Reviewed Pharmacy Topics Discussed, Medications and their functions  Safety Interventions   Safety Discussed/Reviewed Safety Discussed       Alessandra Grout [facility] Care Management Care Management Community Coordinator Direct Phone: [phone] Toll Free: [phone] Fax: [phone]

## 2023-09-09 LAB — CULTURE, BLOOD (ROUTINE X 2)
Culture: NO GROWTH
Culture: NO GROWTH
Special Requests: ADEQUATE
Special Requests: ADEQUATE

## 2023-09-11 ENCOUNTER — Ambulatory Visit (INDEPENDENT_AMBULATORY_CARE_PROVIDER_SITE_OTHER): Payer: 59

## 2023-09-11 ENCOUNTER — Other Ambulatory Visit: Payer: Self-pay | Admitting: Internal Medicine

## 2023-09-11 VITALS — BP 177/60 | HR 77 | Temp 98.5°F | Ht 70.0 in | Wt 190.0 lb

## 2023-09-11 DIAGNOSIS — Z135 Encounter for screening for eye and ear disorders: Secondary | ICD-10-CM | POA: Diagnosis not present

## 2023-09-11 DIAGNOSIS — Z23 Encounter for immunization: Secondary | ICD-10-CM | POA: Diagnosis not present

## 2023-09-11 DIAGNOSIS — I10 Essential (primary) hypertension: Secondary | ICD-10-CM

## 2023-09-11 DIAGNOSIS — Z0000 Encounter for general adult medical examination without abnormal findings: Secondary | ICD-10-CM

## 2023-09-11 MED ORDER — TERAZOSIN HCL 2 MG PO CAPS: 2.0000 mg | ORAL_CAPSULE | Freq: Every day | ORAL | 0 refills | Status: DC

## 2023-09-11 NOTE — Patient Instructions (Addendum)
Mr. Sanderfer , Thank you for taking time to come for your Medicare Wellness Visit. I appreciate your ongoing commitment to your health goals. Please review the following plan we discussed and let me know if I can assist you in the future.   Referrals/Orders/Follow-Ups/Clinician Recommendations: No  This is a list of the screening recommended for you and due dates:  Health Maintenance  Topic Date Due   Colon Cancer Screening  Never done   Eye exam for diabetics  09/15/2022   Flu Shot  07/27/2023   COVID-19 Vaccine (4 - 2023-24 season) 08/27/2023   Hemoglobin A1C  11/18/2023   Medicare Annual Wellness Visit  09/10/2024   DTaP/Tdap/Td vaccine (4 - Td or Tdap) 04/04/2027   Hepatitis C Screening  Completed   HIV Screening  Completed   HPV Vaccine  Aged Out    Advanced directives: (Declined) Advance directive discussed with you today. Even though you declined this today, please call our office should you change your mind, and we can give you the proper paperwork for you to fill out.  Next Medicare Annual Wellness Visit scheduled for next year: No

## 2023-09-11 NOTE — Progress Notes (Addendum)
Subjective:   [name] is a 47 y.o. male who presents for Medicare Annual/Subsequent preventive examination.  Visit Complete: In person  Cardiac Risk Factors include: advanced age (>61men, >49 women);dyslipidemia;family history of premature cardiovascular disease;hypertension;male gender;sedentary lifestyle;smoking/ tobacco exposure     Objective:    Today's Vitals   09/11/23 1534 09/11/23 1535  BP: (!) 180/60 (!) 177/60  Pulse: 77   Temp: 98.5 F (36.9 C)   TempSrc: Temporal   SpO2: 98%   Weight: 190 lb (86.2 kg)   Height: 5\' 10"  (1.778 m)   PainSc: 0-No pain    Body mass index is 27.26 kg/m.     09/11/2023    3:38 PM 09/04/2023   10:22 AM 09/03/2023   10:07 PM 07/04/2023    2:13 AM 09/06/2022    4:18 PM 06/23/2020    7:01 AM 06/17/2020   10:00 AM  Advanced Directives  Does Patient Have a Medical Advance Directive? No No No No No No No  Would patient like information on creating a medical advance directive? No - Patient declined No - Patient declined  No - Patient declined No - Patient declined No - Patient declined No - Patient declined    Current Medications (verified) Outpatient Encounter Medications as of 09/11/2023  Medication Sig   belatacept (NULOJIX) 250 MG SOLR injection Inject into the vein every 30 (thirty) days.   carvedilol (COREG) 6.25 MG tablet Take 1 tablet (6.25 mg total) by mouth 2 (two) times daily with a meal.   cephALEXin (KEFLEX) 500 MG capsule Take 1 capsule (500 mg total) by mouth 2 (two) times daily for 7 days.   ergocalciferol (VITAMIN D2) 1.25 MG (50000 UT) capsule Take 1 capsule (50,000 Units total) by mouth once a week. (Patient taking differently: Take 1 capsule by mouth once a week. Wednesday)   fluocinonide cream (LIDEX) 0.05 % Apply one application to affected areas twice daily   hydrALAZINE (APRESOLINE) 50 MG tablet Take 1 tablet (50 mg total) by mouth 4 (four) times daily. (Patient taking differently: Take 50 mg by mouth 2 (two)  times daily.)   meclizine (ANTIVERT) 25 MG tablet Take 25 mg by mouth 3 (three) times daily as needed for dizziness or nausea.   metoCLOPramide (REGLAN) 10 MG tablet Take 10 mg by mouth 2 (two) times daily.   mycophenolate (MYFORTIC) 180 MG EC tablet Take 360 mg by mouth 2 (two) times daily.   omeprazole (PRILOSEC) 40 MG capsule Take 40 mg by mouth daily.   ondansetron (ZOFRAN) 4 MG tablet Take 4 mg by mouth 2 (two) times daily as needed for nausea or vomiting.   predniSONE (DELTASONE) 5 MG tablet Take 5 mg by mouth daily.   rosuvastatin (CRESTOR) 10 MG tablet Take 1 tablet (10 mg total) by mouth daily. (Patient not taking: Reported on 09/08/2023)   sulfamethoxazole-trimethoprim (BACTRIM,SEPTRA) 400-80 MG per tablet Take 1 tablet by mouth every Monday, Wednesday, and Friday.    No facility-administered encounter medications on file as of 09/11/2023.    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   Diabetes mellitus without complication (HCC)    no since pancreas  transplant   Dialysis patient [facility]) 04/18/2012   "[facility]; [place], [place], Sat"   Gastroparesis    Gastropathy    GERD (gastroesophageal reflux disease)    Hypertension    MRSA infection    over 10 years ago per patient. in legs   Past Surgical History:  Procedure Laterality Date   AV FISTULA PLACEMENT  08/2011   left upper arm   BELOW KNEE LEG AMPUTATION  "it's been awhile"   bilaterally   CATARACT EXTRACTION  ~ 2011   right   COMBINED KIDNEY-PANCREAS TRANSPLANT  2014   ESOPHAGOGASTRODUODENOSCOPY N/A 12/30/2016   Procedure: ESOPHAGOGASTRODUODENOSCOPY (EGD);  Surgeon: Jeani Hawking, MD;  Location[facility] ENDOSCOPY;  Service: Endoscopy;   Laterality: N/A;   OLECRANON BURSECTOMY Right 06/23/2020   Procedure: RIGHT ELBOW EXCISION OLECRANON BURSITIS;  Surgeon: Nadara Mustard, MD;  Location: [facility];  Service: Orthopedics;  Laterality: Right;   Family History  Problem Relation Age of Onset   Hypertension Mother    Diabetes Mother    Kidney disease Mother    Diabetes Maternal Grandmother    Diabetes Paternal Grandmother    Diabetes Other    Hypertension Other    Lung cancer Maternal Aunt    Colon cancer Neg Hx    Esophageal cancer Neg Hx    Rectal cancer Neg Hx    Stomach cancer Neg Hx    Social History   Socioeconomic History   Marital status: Married    Spouse name: Not on file   Number of children: 1   Years of education: 11   Highest education level: Not on file  Occupational History   Occupation: Disability  Tobacco Use   Smoking status: Former    Types: Cigars    Quit date: 07/06/2022    Years since quitting: 1.1   Smokeless tobacco: Never   Tobacco comments:    black and mild  Vaping Use   Vaping status: Never Used  Substance and Sexual Activity   Alcohol use: No   Drug use: Not Currently    Frequency: 3.0 times per week    Types: Marijuana    Comment: Occasionally   Sexual activity: Yes    Partners: Female  Other Topics Concern   Not on file  Social History Narrative   Fun: Restore old cars    Social Determinants of Health   Financial Resource Strain: Low Risk  (09/11/2023)   Overall Financial Resource Strain (CARDIA)    Difficulty of Paying Living Expenses: Not hard at all  Food Insecurity: No Food Insecurity (09/11/2023)   Hunger Vital Sign    Worried About Running Out of Food in the Last Year: Never true    Ran Out of Food in the Last Year: Never true  Transportation Needs: No Transportation Needs (09/11/2023)   PRAPARE - Administrator, Civil Service (Medical): No    Lack of Transportation (Non-Medical): No  Physical Activity: Inactive (09/11/2023)    Exercise Vital Sign    Days of Exercise per Week: 0 days    Minutes of Exercise per Session: 0 min  Stress: No Stress Concern Present (09/11/2023)   [employer] of Occupational Health - Occupational Stress Questionnaire    Feeling of Stress : Not at all  Social Connections: Socially Integrated (09/11/2023)   Social Connection and Isolation Panel [NHANES]    Frequency of Communication with Friends and Family: More than three times a week    Frequency of  Social Gatherings with Friends and Family: More than three times a week    Attends Religious Services: More than 4 times per year    Active Member of [employer] or Organizations: Yes    Attends Engineer, structural: More than 4 times per year    Marital Status: Married    Tobacco Counseling Counseling given: Not Answered Tobacco comments: black and mild   Clinical Intake:  Pre-visit preparation completed: Yes  Pain : No/denies pain Pain Score: 0-No pain     BMI - recorded: 27.26 Nutritional Status: BMI 25 -29 Overweight Nutritional Risks: None Diabetes: No (prediabetic) CBG done?: No Did pt. bring in CBG monitor from home?: No  How often do you need to have someone help you when you read instructions, pamphlets, or other written materials from your doctor or pharmacy?: 1 - Never What is the last grade level you completed in school?: Some college  Interpreter Needed?: No  Information entered by :: Horatio Bertz N. Milliani Herrada, LPN.   Activities of Daily Living    09/11/2023    3:40 PM 09/04/2023   10:22 AM  In your present state of health, do you have any difficulty performing the following activities:  Hearing? 0 0  Vision? 0 0  Difficulty concentrating or making decisions? 0 0  Walking or climbing stairs? 0 0  Dressing or bathing? 0 0  Doing errands, shopping? 0 0  Preparing Food and eating ? N   Using the Toilet? N   In the past six months, have you accidently leaked urine? N   Do you have problems with loss of  bowel control? N   Managing your Medications? N   Managing your Finances? N   Housekeeping or managing your Housekeeping? N     Patient Care Team: [name], MD as PCP - General (Internal Medicine) Gentry Roch, MD as Referring Physician (Ophthalmology)  Indicate any recent Medical Services you may have received from other than [facility] providers in the past year (date may be approximate).     Assessment:   This is a routine wellness examination for [name].  Hearing/Vision screen Hearing Screening - Comments:: Patient denied any hearing difficulty.   No hearing aids.  Vision Screening - Comments:: Patient does wear corrective lenses/contacts.  Annual eye exam was done by: [facility]    Goals Addressed             This Visit's Progress    Client understands the importance of follow-up with providers by attending scheduled visits        Depression Screen    09/11/2023    3:36 PM 09/06/2022    4:08 PM 08/26/2016   10:04 AM 04/22/2016    9:41 AM 12/14/2015   11:56 AM 07/29/2014    1:56 PM  PHQ 2/9 Scores  PHQ - 2 Score 0 0 0 0 0 0  PHQ- 9 Score 0         Fall Risk    09/11/2023    3:39 PM 09/06/2022    4:07 PM 07/29/2014    1:56 PM 07/02/2014    2:30 PM  Fall Risk   Falls in the past year? 0 0 No Yes  Number falls in past yr: 0 0    Injury with Fall? 0 0    Risk for fall due to : No Fall Risks No Fall Risks    Follow up Falls prevention discussed Falls prevention discussed      MEDICARE  RISK AT HOME: Medicare Risk at Home Any stairs in or around the home?: No If so, are there any without handrails?: No Home free of loose throw rugs in walkways, pet beds, electrical cords, etc?: Yes Adequate lighting in your home to reduce risk of falls?: Yes Life alert?: No Use of a cane, walker or w/c?: No Grab bars in the bathroom?: Yes Shower chair or bench in shower?: Yes Elevated toilet seat or a handicapped toilet?: Yes  TIMED UP AND GO:  Was the test  performed?  Yes  Length of time to ambulate 10 feet: 8 sec Gait steady and fast without use of assistive device    Cognitive Function:        09/11/2023    3:40 PM 09/06/2022    4:13 PM  6CIT Screen  What Year? 0 points 0 points  What month? 0 points 0 points  What time? 0 points 0 points  Count back from 20 0 points 0 points  Months in reverse 0 points 0 points  Repeat phrase 0 points 0 points  Total Score 0 points 0 points    Immunizations Immunization History  Administered Date(s) Administered   Influenza Whole 09/26/2008, 12/07/2009   Influenza, Seasonal, Injecte, Preservative Fre 09/11/2023   Influenza,inj,Quad PF,6+ Mos 09/10/2014, 11/03/2015, 08/26/2016, 08/22/2017, 09/12/2018   Influenza-Unspecified 10/26/2021   PFIZER(Purple Top)SARS-COV-2 Vaccination 03/11/2020, 04/01/2020, 08/13/2020   PNEUMOCOCCAL CONJUGATE-20 01/10/2022   Pneumococcal Polysaccharide-23 11/26/1999   Pneumococcal-Unspecified 01/27/2012   Td 05/26/1998, 07/26/2010   Tdap 04/03/2017    TDAP status: Up to date  Flu Vaccine status: Due, Education has been provided regarding the importance of this vaccine. Advised may receive this vaccine at local pharmacy or Health Dept. Aware to provide a copy of the vaccination record if obtained from local pharmacy or Health Dept. Verbalized acceptance and understanding.  Pneumococcal vaccine status: Up to date  Covid-19 vaccine status: Completed vaccines  Qualifies for Shingles Vaccine? No   Zostavax completed No   Shingrix Completed?: No.    Education has been provided regarding the importance of this vaccine. Patient has been advised to call insurance company to determine out of pocket expense if they have not yet received this vaccine. Advised may also receive vaccine at local pharmacy or Health Dept. Verbalized acceptance and understanding.  Screening Tests Health Maintenance  Topic Date Due   Colonoscopy  Never done   OPHTHALMOLOGY EXAM  09/15/2022    COVID-19 Vaccine (4 - 2023-24 season) 08/27/2023   HEMOGLOBIN A1C  11/18/2023   Medicare Annual Wellness (AWV)  09/10/2024   DTaP/Tdap/Td (4 - Td or Tdap) 04/04/2027   INFLUENZA VACCINE  Completed   Hepatitis C Screening  Completed   HIV Screening  Completed   HPV VACCINES  Aged Out    Health Maintenance  Health Maintenance Due  Topic Date Due   Colonoscopy  Never done   OPHTHALMOLOGY EXAM  09/15/2022   COVID-19 Vaccine (4 - 2023-24 season) 08/27/2023    Colorectal cancer screening: Type of screening: FOBT/FIT. Completed 02/26/2020. Repeat every 1-2 years  Lung Cancer Screening: (Low Dose CT Chest recommended if Age 9-80 years, 20 pack-year currently smoking OR have quit w/in 15years.) does not qualify.   Lung Cancer Screening Referral: no  Additional Screening:  Hepatitis C Screening: does qualify; Completed 04/25/2012  Vision Screening: Recommended annual ophthalmology exams for early detection of glaucoma and other disorders of the eye. Is the patient up to date with their annual eye exam?  No  Who  is the provider or what is the name of the office in which the patient attends annual eye exams? Need a new eye doctor If pt is not established with a provider, would they like to be referred to a provider to establish care? Yes .   Dental Screening: Recommended annual dental exams for proper oral hygiene  Diabetic Foot Exam: N/A  Community Resource Referral / Chronic Care Management: CRR required this visit?  No   CCM required this visit?  No     Plan:     I have personally reviewed and noted the following in the patient's chart:   Medical and social history Use of alcohol, tobacco or illicit drugs  Current medications and supplements including opioid prescriptions. Patient is not currently taking opioid prescriptions. Functional ability and status Nutritional status Physical activity Advanced directives List of other physicians Hospitalizations, surgeries, and ER  visits in previous 12 months Vitals Screenings to include cognitive, depression, and falls Referrals and appointments  In addition, I have reviewed and discussed with patient certain preventive protocols, quality metrics, and best practice recommendations. A written personalized care plan for preventive services as well as general preventive health recommendations were provided to patient.     Mickeal Needy, LPN   1/61/0960   After Visit Summary: Printed and given to patient.  Nurse Notes: Normal cognitive status assessed by direct observation by this Nurse Health Advisor. No abnormalities found.

## 2023-09-11 NOTE — Progress Notes (Unsigned)
Lab Results  Component Value Date   WBC 9.5 09/05/2023   HGB 15.4 09/05/2023   HCT 48.2 09/05/2023   PLT 262 09/05/2023   GLUCOSE 117 (H) 09/06/2023   CHOL 173 07/20/2023   TRIG 189.0 (H) 07/20/2023   HDL 45.00 07/20/2023   LDLDIRECT 116 (H) 01/21/2011   LDLCALC 90 07/20/2023   ALT 8 09/03/2023   AST 12 (L) 09/03/2023   NA 140 09/06/2023   K 3.1 (L) 09/06/2023   CL 111 09/06/2023   CREATININE 1.83 (H) 09/06/2023   BUN 10 09/06/2023   CO2 19 (L) 09/06/2023   TSH 1.04 07/20/2023   PSA 4.76 (H) 07/20/2023   INR 1.1 07/04/2023   HGBA1C 5.9 05/18/2023   MICROALBUR 0.7 08/26/2016

## 2023-09-12 DIAGNOSIS — N183 Chronic kidney disease, stage 3 unspecified: Secondary | ICD-10-CM | POA: Diagnosis not present

## 2023-09-12 DIAGNOSIS — I129 Hypertensive chronic kidney disease with stage 1 through stage 4 chronic kidney disease, or unspecified chronic kidney disease: Secondary | ICD-10-CM | POA: Diagnosis not present

## 2023-09-12 DIAGNOSIS — R799 Abnormal finding of blood chemistry, unspecified: Secondary | ICD-10-CM | POA: Diagnosis not present

## 2023-09-12 DIAGNOSIS — E7800 Pure hypercholesterolemia, unspecified: Secondary | ICD-10-CM | POA: Diagnosis not present

## 2023-09-12 DIAGNOSIS — N390 Urinary tract infection, site not specified: Secondary | ICD-10-CM | POA: Diagnosis not present

## 2023-09-12 DIAGNOSIS — D751 Secondary polycythemia: Secondary | ICD-10-CM | POA: Diagnosis not present

## 2023-09-12 DIAGNOSIS — Z9483 Pancreas transplant status: Secondary | ICD-10-CM | POA: Diagnosis not present

## 2023-09-12 DIAGNOSIS — Z79899 Other long term (current) drug therapy: Secondary | ICD-10-CM | POA: Diagnosis not present

## 2023-09-12 DIAGNOSIS — Z940 Kidney transplant status: Secondary | ICD-10-CM | POA: Diagnosis not present

## 2023-09-12 LAB — COMPREHENSIVE METABOLIC PANEL: eGFR: 42

## 2023-09-12 LAB — HEMOGLOBIN A1C: Hemoglobin A1C: 5.8

## 2023-09-13 LAB — LAB REPORT - SCANNED
A1c: 5.8
Creatinine, POC: 104.9 mg/dL
EGFR: 42

## 2023-09-18 ENCOUNTER — Emergency Department (HOSPITAL_COMMUNITY): Payer: 59

## 2023-09-18 ENCOUNTER — Ambulatory Visit: Payer: 59 | Admitting: Internal Medicine

## 2023-09-18 ENCOUNTER — Inpatient Hospital Stay (HOSPITAL_COMMUNITY): Payer: 59

## 2023-09-18 ENCOUNTER — Encounter: Payer: Self-pay | Admitting: Internal Medicine

## 2023-09-18 ENCOUNTER — Encounter (HOSPITAL_COMMUNITY): Payer: Self-pay | Admitting: Emergency Medicine

## 2023-09-18 ENCOUNTER — Inpatient Hospital Stay (HOSPITAL_COMMUNITY)
Admission: EM | Admit: 2023-09-18 | Discharge: 2023-09-29 | DRG: 064 | Disposition: A | Discharge status: Rehabilitation Facility | Payer: 59 | Attending: Internal Medicine | Admitting: Internal Medicine

## 2023-09-18 VITALS — BP 136/74 | HR 73 | Temp 98.0°F | Ht 70.0 in | Wt 190.0 lb

## 2023-09-18 DIAGNOSIS — T782XXA Anaphylactic shock, unspecified, initial encounter: Secondary | ICD-10-CM

## 2023-09-18 DIAGNOSIS — M19042 Primary osteoarthritis, left hand: Secondary | ICD-10-CM | POA: Diagnosis present

## 2023-09-18 DIAGNOSIS — N3000 Acute cystitis without hematuria: Secondary | ICD-10-CM | POA: Diagnosis not present

## 2023-09-18 DIAGNOSIS — I151 Hypertension secondary to other renal disorders: Secondary | ICD-10-CM

## 2023-09-18 DIAGNOSIS — R918 Other nonspecific abnormal finding of lung field: Secondary | ICD-10-CM | POA: Diagnosis not present

## 2023-09-18 DIAGNOSIS — E1143 Type 2 diabetes mellitus with diabetic autonomic (poly)neuropathy: Secondary | ICD-10-CM | POA: Diagnosis not present

## 2023-09-18 DIAGNOSIS — R946 Abnormal results of thyroid function studies: Secondary | ICD-10-CM | POA: Diagnosis present

## 2023-09-18 DIAGNOSIS — N179 Acute kidney failure, unspecified: Secondary | ICD-10-CM | POA: Diagnosis present

## 2023-09-18 DIAGNOSIS — R55 Syncope and collapse: Secondary | ICD-10-CM | POA: Diagnosis not present

## 2023-09-18 DIAGNOSIS — R29716 NIHSS score 16: Secondary | ICD-10-CM | POA: Diagnosis present

## 2023-09-18 DIAGNOSIS — E876 Hypokalemia: Secondary | ICD-10-CM | POA: Diagnosis not present

## 2023-09-18 DIAGNOSIS — N390 Urinary tract infection, site not specified: Secondary | ICD-10-CM | POA: Diagnosis not present

## 2023-09-18 DIAGNOSIS — B9620 Unspecified Escherichia coli [E. coli] as the cause of diseases classified elsewhere: Secondary | ICD-10-CM | POA: Diagnosis present

## 2023-09-18 DIAGNOSIS — R4182 Altered mental status, unspecified: Principal | ICD-10-CM

## 2023-09-18 DIAGNOSIS — K3184 Gastroparesis: Secondary | ICD-10-CM | POA: Diagnosis present

## 2023-09-18 DIAGNOSIS — T886XXA Anaphylactic reaction due to adverse effect of correct drug or medicament properly administered, initial encounter: Secondary | ICD-10-CM | POA: Diagnosis not present

## 2023-09-18 DIAGNOSIS — Z8249 Family history of ischemic heart disease and other diseases of the circulatory system: Secondary | ICD-10-CM

## 2023-09-18 DIAGNOSIS — R0689 Other abnormalities of breathing: Secondary | ICD-10-CM | POA: Diagnosis not present

## 2023-09-18 DIAGNOSIS — R54 Age-related physical debility: Secondary | ICD-10-CM | POA: Diagnosis present

## 2023-09-18 DIAGNOSIS — R569 Unspecified convulsions: Secondary | ICD-10-CM | POA: Diagnosis not present

## 2023-09-18 DIAGNOSIS — E785 Hyperlipidemia, unspecified: Secondary | ICD-10-CM | POA: Diagnosis present

## 2023-09-18 DIAGNOSIS — K219 Gastro-esophageal reflux disease without esophagitis: Secondary | ICD-10-CM | POA: Diagnosis present

## 2023-09-18 DIAGNOSIS — Z833 Family history of diabetes mellitus: Secondary | ICD-10-CM

## 2023-09-18 DIAGNOSIS — N1831 Chronic kidney disease, stage 3a: Secondary | ICD-10-CM | POA: Diagnosis not present

## 2023-09-18 DIAGNOSIS — I129 Hypertensive chronic kidney disease with stage 1 through stage 4 chronic kidney disease, or unspecified chronic kidney disease: Secondary | ICD-10-CM | POA: Diagnosis present

## 2023-09-18 DIAGNOSIS — J9601 Acute respiratory failure with hypoxia: Secondary | ICD-10-CM | POA: Diagnosis present

## 2023-09-18 DIAGNOSIS — N1832 Chronic kidney disease, stage 3b: Secondary | ICD-10-CM | POA: Diagnosis not present

## 2023-09-18 DIAGNOSIS — M19041 Primary osteoarthritis, right hand: Secondary | ICD-10-CM | POA: Diagnosis present

## 2023-09-18 DIAGNOSIS — I472 Ventricular tachycardia, unspecified: Secondary | ICD-10-CM | POA: Diagnosis present

## 2023-09-18 DIAGNOSIS — R509 Fever, unspecified: Secondary | ICD-10-CM | POA: Diagnosis not present

## 2023-09-18 DIAGNOSIS — Z87891 Personal history of nicotine dependence: Secondary | ICD-10-CM

## 2023-09-18 DIAGNOSIS — I639 Cerebral infarction, unspecified: Secondary | ICD-10-CM | POA: Diagnosis not present

## 2023-09-18 DIAGNOSIS — Z8614 Personal history of Methicillin resistant Staphylococcus aureus infection: Secondary | ICD-10-CM

## 2023-09-18 DIAGNOSIS — I6359 Cerebral infarction due to unspecified occlusion or stenosis of other cerebral artery: Principal | ICD-10-CM | POA: Diagnosis present

## 2023-09-18 DIAGNOSIS — R7881 Bacteremia: Secondary | ICD-10-CM | POA: Diagnosis not present

## 2023-09-18 DIAGNOSIS — I6522 Occlusion and stenosis of left carotid artery: Secondary | ICD-10-CM | POA: Diagnosis not present

## 2023-09-18 DIAGNOSIS — E1122 Type 2 diabetes mellitus with diabetic chronic kidney disease: Secondary | ICD-10-CM | POA: Diagnosis not present

## 2023-09-18 DIAGNOSIS — R972 Elevated prostate specific antigen [PSA]: Secondary | ICD-10-CM | POA: Insufficient documentation

## 2023-09-18 DIAGNOSIS — E872 Acidosis, unspecified: Secondary | ICD-10-CM | POA: Diagnosis not present

## 2023-09-18 DIAGNOSIS — R4701 Aphasia: Secondary | ICD-10-CM | POA: Diagnosis present

## 2023-09-18 DIAGNOSIS — I63512 Cerebral infarction due to unspecified occlusion or stenosis of left middle cerebral artery: Secondary | ICD-10-CM | POA: Diagnosis not present

## 2023-09-18 DIAGNOSIS — R0902 Hypoxemia: Secondary | ICD-10-CM | POA: Diagnosis not present

## 2023-09-18 DIAGNOSIS — Z7952 Long term (current) use of systemic steroids: Secondary | ICD-10-CM

## 2023-09-18 DIAGNOSIS — T8619 Other complication of kidney transplant: Secondary | ICD-10-CM | POA: Diagnosis not present

## 2023-09-18 DIAGNOSIS — E860 Dehydration: Secondary | ICD-10-CM | POA: Diagnosis present

## 2023-09-18 DIAGNOSIS — I10 Essential (primary) hypertension: Secondary | ICD-10-CM

## 2023-09-18 DIAGNOSIS — G9341 Metabolic encephalopathy: Secondary | ICD-10-CM | POA: Diagnosis present

## 2023-09-18 DIAGNOSIS — N183 Chronic kidney disease, stage 3 unspecified: Secondary | ICD-10-CM | POA: Diagnosis not present

## 2023-09-18 DIAGNOSIS — Z89511 Acquired absence of right leg below knee: Secondary | ICD-10-CM | POA: Diagnosis not present

## 2023-09-18 DIAGNOSIS — I69398 Other sequelae of cerebral infarction: Secondary | ICD-10-CM | POA: Diagnosis not present

## 2023-09-18 DIAGNOSIS — A499 Bacterial infection, unspecified: Secondary | ICD-10-CM | POA: Diagnosis not present

## 2023-09-18 DIAGNOSIS — Z9483 Pancreas transplant status: Secondary | ICD-10-CM | POA: Diagnosis not present

## 2023-09-18 DIAGNOSIS — E1151 Type 2 diabetes mellitus with diabetic peripheral angiopathy without gangrene: Secondary | ICD-10-CM | POA: Diagnosis not present

## 2023-09-18 DIAGNOSIS — Z4682 Encounter for fitting and adjustment of non-vascular catheter: Secondary | ICD-10-CM | POA: Diagnosis not present

## 2023-09-18 DIAGNOSIS — I69322 Dysarthria following cerebral infarction: Secondary | ICD-10-CM | POA: Diagnosis not present

## 2023-09-18 DIAGNOSIS — E871 Hypo-osmolality and hyponatremia: Secondary | ICD-10-CM | POA: Diagnosis present

## 2023-09-18 DIAGNOSIS — R29810 Facial weakness: Secondary | ICD-10-CM | POA: Diagnosis present

## 2023-09-18 DIAGNOSIS — D84821 Immunodeficiency due to drugs: Secondary | ICD-10-CM | POA: Diagnosis present

## 2023-09-18 DIAGNOSIS — R29818 Other symptoms and signs involving the nervous system: Secondary | ICD-10-CM | POA: Diagnosis not present

## 2023-09-18 DIAGNOSIS — Z841 Family history of disorders of kidney and ureter: Secondary | ICD-10-CM

## 2023-09-18 DIAGNOSIS — Z888 Allergy status to other drugs, medicaments and biological substances status: Secondary | ICD-10-CM

## 2023-09-18 DIAGNOSIS — G8191 Hemiplegia, unspecified affecting right dominant side: Secondary | ICD-10-CM | POA: Diagnosis not present

## 2023-09-18 DIAGNOSIS — R404 Transient alteration of awareness: Secondary | ICD-10-CM | POA: Diagnosis not present

## 2023-09-18 DIAGNOSIS — I63522 Cerebral infarction due to unspecified occlusion or stenosis of left anterior cerebral artery: Secondary | ICD-10-CM | POA: Diagnosis not present

## 2023-09-18 DIAGNOSIS — I69392 Facial weakness following cerebral infarction: Secondary | ICD-10-CM | POA: Diagnosis not present

## 2023-09-18 DIAGNOSIS — Y830 Surgical operation with transplant of whole organ as the cause of abnormal reaction of the patient, or of later complication, without mention of misadventure at the time of the procedure: Secondary | ICD-10-CM | POA: Diagnosis present

## 2023-09-18 DIAGNOSIS — N170 Acute kidney failure with tubular necrosis: Secondary | ICD-10-CM | POA: Diagnosis not present

## 2023-09-18 DIAGNOSIS — D751 Secondary polycythemia: Secondary | ICD-10-CM | POA: Diagnosis present

## 2023-09-18 DIAGNOSIS — I2489 Other forms of acute ischemic heart disease: Secondary | ICD-10-CM | POA: Diagnosis not present

## 2023-09-18 DIAGNOSIS — E1165 Type 2 diabetes mellitus with hyperglycemia: Secondary | ICD-10-CM | POA: Diagnosis not present

## 2023-09-18 DIAGNOSIS — Z79899 Other long term (current) drug therapy: Secondary | ICD-10-CM

## 2023-09-18 DIAGNOSIS — Z89512 Acquired absence of left leg below knee: Secondary | ICD-10-CM | POA: Diagnosis not present

## 2023-09-18 DIAGNOSIS — Z79624 Long term (current) use of inhibitors of nucleotide synthesis: Secondary | ICD-10-CM

## 2023-09-18 DIAGNOSIS — Z940 Kidney transplant status: Secondary | ICD-10-CM | POA: Diagnosis not present

## 2023-09-18 DIAGNOSIS — I69359 Hemiplegia and hemiparesis following cerebral infarction affecting unspecified side: Secondary | ICD-10-CM | POA: Diagnosis not present

## 2023-09-18 DIAGNOSIS — I6389 Other cerebral infarction: Secondary | ICD-10-CM | POA: Diagnosis not present

## 2023-09-18 DIAGNOSIS — I517 Cardiomegaly: Secondary | ICD-10-CM | POA: Diagnosis not present

## 2023-09-18 DIAGNOSIS — R579 Shock, unspecified: Secondary | ICD-10-CM | POA: Diagnosis not present

## 2023-09-18 DIAGNOSIS — I69351 Hemiplegia and hemiparesis following cerebral infarction affecting right dominant side: Secondary | ICD-10-CM | POA: Diagnosis not present

## 2023-09-18 DIAGNOSIS — F32A Depression, unspecified: Secondary | ICD-10-CM | POA: Diagnosis present

## 2023-09-18 DIAGNOSIS — D649 Anemia, unspecified: Secondary | ICD-10-CM | POA: Diagnosis not present

## 2023-09-18 DIAGNOSIS — I69320 Aphasia following cerebral infarction: Secondary | ICD-10-CM | POA: Diagnosis not present

## 2023-09-18 DIAGNOSIS — T451X5A Adverse effect of antineoplastic and immunosuppressive drugs, initial encounter: Secondary | ICD-10-CM | POA: Diagnosis present

## 2023-09-18 DIAGNOSIS — G8929 Other chronic pain: Secondary | ICD-10-CM | POA: Diagnosis present

## 2023-09-18 DIAGNOSIS — W19XXXA Unspecified fall, initial encounter: Secondary | ICD-10-CM | POA: Diagnosis not present

## 2023-09-18 DIAGNOSIS — R339 Retention of urine, unspecified: Secondary | ICD-10-CM | POA: Diagnosis not present

## 2023-09-18 DIAGNOSIS — S06350A Traumatic hemorrhage of left cerebrum without loss of consciousness, initial encounter: Secondary | ICD-10-CM | POA: Diagnosis not present

## 2023-09-18 LAB — POCT I-STAT 7, (LYTES, BLD GAS, ICA,H+H)
Acid-base deficit: 15 mmol/L — ABNORMAL HIGH (ref 0.0–2.0)
Bicarbonate: 12.6 mmol/L — ABNORMAL LOW (ref 20.0–28.0)
Calcium, Ion: 1.22 mmol/L (ref 1.15–1.40)
HCT: 58 % — ABNORMAL HIGH (ref 39.0–52.0)
Hemoglobin: 19.7 g/dL — ABNORMAL HIGH (ref 13.0–17.0)
O2 Saturation: 99 %
Patient temperature: 97.4
Potassium: 4.1 mmol/L (ref 3.5–5.1)
Sodium: 135 mmol/L (ref 135–145)
TCO2: 14 mmol/L — ABNORMAL LOW (ref 22–32)
pCO2 arterial: 33.1 mmHg (ref 32–48)
pH, Arterial: 7.185 — CL (ref 7.35–7.45)
pO2, Arterial: 185 mmHg — ABNORMAL HIGH (ref 83–108)

## 2023-09-18 LAB — CBC
HCT: 65 % — ABNORMAL HIGH (ref 39.0–52.0)
Hemoglobin: 20.3 g/dL — ABNORMAL HIGH (ref 13.0–17.0)
MCH: 30.9 pg (ref 26.0–34.0)
MCHC: 31.2 g/dL (ref 30.0–36.0)
MCV: 98.8 fL (ref 80.0–100.0)
Platelets: 288 10*3/uL (ref 150–400)
RBC: 6.58 MIL/uL — ABNORMAL HIGH (ref 4.22–5.81)
RDW: 18.6 % — ABNORMAL HIGH (ref 11.5–15.5)
WBC: 6.8 10*3/uL (ref 4.0–10.5)
nRBC: 0 % (ref 0.0–0.2)

## 2023-09-18 LAB — URINALYSIS, ROUTINE W REFLEX MICROSCOPIC
Bacteria, UA: NONE SEEN
Bilirubin Urine: NEGATIVE
Bilirubin Urine: NEGATIVE
Glucose, UA: NEGATIVE mg/dL
Hgb urine dipstick: NEGATIVE
Hgb urine dipstick: NEGATIVE
Ketones, ur: NEGATIVE
Ketones, ur: NEGATIVE mg/dL
Leukocytes,Ua: NEGATIVE
Nitrite: NEGATIVE
Nitrite: NEGATIVE
Protein, ur: 30 mg/dL — AB
Specific Gravity, Urine: 1.01 (ref 1.000–1.030)
Specific Gravity, Urine: 1.005 (ref 1.005–1.030)
Urine Glucose: NEGATIVE
Urobilinogen, UA: 0.2 (ref 0.0–1.0)
pH: 7.5 (ref 5.0–8.0)
pH: 7 (ref 5.0–8.0)

## 2023-09-18 LAB — DIFFERENTIAL
Abs Immature Granulocytes: 0.08 10*3/uL — ABNORMAL HIGH (ref 0.00–0.07)
Basophils Absolute: 0 10*3/uL (ref 0.0–0.1)
Basophils Relative: 0 %
Eosinophils Absolute: 0.1 10*3/uL (ref 0.0–0.5)
Eosinophils Relative: 1 %
Immature Granulocytes: 1 %
Lymphocytes Relative: 42 %
Lymphs Abs: 2.9 10*3/uL (ref 0.7–4.0)
Monocytes Absolute: 0.3 10*3/uL (ref 0.1–1.0)
Monocytes Relative: 4 %
Neutro Abs: 3.5 10*3/uL (ref 1.7–7.7)
Neutrophils Relative %: 52 %

## 2023-09-18 LAB — I-STAT ARTERIAL BLOOD GAS, ED
Acid-base deficit: 15 mmol/L — ABNORMAL HIGH (ref 0.0–2.0)
Bicarbonate: 13.2 mmol/L — ABNORMAL LOW (ref 20.0–28.0)
Calcium, Ion: 1.19 mmol/L (ref 1.15–1.40)
HCT: 61 % — ABNORMAL HIGH (ref 39.0–52.0)
Hemoglobin: 20.7 g/dL — ABNORMAL HIGH (ref 13.0–17.0)
O2 Saturation: 100 %
Potassium: 3.8 mmol/L (ref 3.5–5.1)
Sodium: 135 mmol/L (ref 135–145)
TCO2: 14 mmol/L — ABNORMAL LOW (ref 22–32)
pCO2 arterial: 39.7 mmHg (ref 32–48)
pH, Arterial: 7.13 — CL (ref 7.35–7.45)
pO2, Arterial: 557 mmHg — ABNORMAL HIGH (ref 83–108)

## 2023-09-18 LAB — PSA: PSA: 2.3 ng/mL (ref 0.10–4.00)

## 2023-09-18 LAB — I-STAT CHEM 8, ED
BUN: 27 mg/dL — ABNORMAL HIGH (ref 6–20)
Calcium, Ion: 0.97 mmol/L — ABNORMAL LOW (ref 1.15–1.40)
Chloride: 112 mmol/L — ABNORMAL HIGH (ref 98–111)
Creatinine, Ser: 2 mg/dL — ABNORMAL HIGH (ref 0.61–1.24)
Glucose, Bld: 175 mg/dL — ABNORMAL HIGH (ref 70–99)
HCT: 64 % — ABNORMAL HIGH (ref 39.0–52.0)
Hemoglobin: 21.8 g/dL (ref 13.0–17.0)
Potassium: 3.2 mmol/L — ABNORMAL LOW (ref 3.5–5.1)
Sodium: 137 mmol/L (ref 135–145)
TCO2: 13 mmol/L — ABNORMAL LOW (ref 22–32)

## 2023-09-18 LAB — COMPREHENSIVE METABOLIC PANEL
ALT: 12 U/L (ref 0–44)
AST: 17 U/L (ref 15–41)
Albumin: 2.5 g/dL — ABNORMAL LOW (ref 3.5–5.0)
Alkaline Phosphatase: 80 U/L (ref 38–126)
Anion gap: 11 (ref 5–15)
BUN: 20 mg/dL (ref 6–20)
CO2: 11 mmol/L — ABNORMAL LOW (ref 22–32)
Calcium: 7.6 mg/dL — ABNORMAL LOW (ref 8.9–10.3)
Chloride: 112 mmol/L — ABNORMAL HIGH (ref 98–111)
Creatinine, Ser: 1.96 mg/dL — ABNORMAL HIGH (ref 0.61–1.24)
GFR, Estimated: 41 mL/min — ABNORMAL LOW (ref >60)
Glucose, Bld: 183 mg/dL — ABNORMAL HIGH (ref 70–99)
Potassium: 3.2 mmol/L — ABNORMAL LOW (ref 3.5–5.1)
Sodium: 134 mmol/L — ABNORMAL LOW (ref 135–145)
Total Bilirubin: 1 mg/dL (ref 0.3–1.2)
Total Protein: 5.4 g/dL — ABNORMAL LOW (ref 6.5–8.1)

## 2023-09-18 LAB — PROTIME-INR
INR: 1.1 (ref 0.8–1.2)
Prothrombin Time: 14.4 seconds (ref 11.4–15.2)

## 2023-09-18 LAB — RAPID URINE DRUG SCREEN, HOSP PERFORMED
Amphetamines: NOT DETECTED
Barbiturates: NOT DETECTED
Benzodiazepines: NOT DETECTED
Cocaine: NOT DETECTED
Opiates: NOT DETECTED
Tetrahydrocannabinol: NOT DETECTED

## 2023-09-18 LAB — TSH: TSH: 7.941 u[IU]/mL — ABNORMAL HIGH (ref 0.350–4.500)

## 2023-09-18 LAB — GLUCOSE, CAPILLARY
Glucose-Capillary: 179 mg/dL — ABNORMAL HIGH (ref 70–99)
Glucose-Capillary: 197 mg/dL — ABNORMAL HIGH (ref 70–99)

## 2023-09-18 LAB — MAGNESIUM: Magnesium: 1.9 mg/dL (ref 1.7–2.4)

## 2023-09-18 LAB — APTT: aPTT: 30 seconds (ref 24–36)

## 2023-09-18 LAB — TROPONIN I (HIGH SENSITIVITY): Troponin I (High Sensitivity): 147 ng/L (ref <18)

## 2023-09-18 LAB — ETHANOL: Alcohol, Ethyl (B): <10 mg/dL (ref <10)

## 2023-09-18 LAB — CBG MONITORING, ED
Glucose-Capillary: 201 mg/dL — ABNORMAL HIGH (ref 70–99)
Glucose-Capillary: 249 mg/dL — ABNORMAL HIGH (ref 70–99)

## 2023-09-18 MED ORDER — DOCUSATE SODIUM 100 MG PO CAPS: 100.0000 mg | ORAL_CAPSULE | Freq: Two times a day (BID) | ORAL | Status: DC | PRN

## 2023-09-18 MED ORDER — FENTANYL 2500MCG IN NS 250ML (10MCG/ML) PREMIX INFUSION
50.0000 ug/h | INTRAVENOUS | Status: DC
  Administered 2023-09-18: 100 ug/h via INTRAVENOUS
  Administered 2023-09-19: 75 ug/h via INTRAVENOUS
  Filled 2023-09-18 (×2): qty 250

## 2023-09-18 MED ORDER — ACETAMINOPHEN 325 MG PO TABS: 650.0000 mg | ORAL_TABLET | ORAL | Status: DC | PRN

## 2023-09-18 MED ORDER — ETOMIDATE 2 MG/ML IV SOLN
INTRAVENOUS | Status: DC | PRN
Start: 2023-09-18 — End: 2023-09-18
  Administered 2023-09-18: 20 mg via INTRAVENOUS

## 2023-09-18 MED ORDER — INSULIN ASPART 100 UNIT/ML IJ SOLN
0.0000 [IU] | INTRAMUSCULAR | Status: DC
  Administered 2023-09-18 – 2023-09-19 (×3): 3 [IU] via SUBCUTANEOUS
  Administered 2023-09-19: 2 [IU] via SUBCUTANEOUS

## 2023-09-18 MED ORDER — CHLORHEXIDINE GLUCONATE CLOTH 2 % EX PADS
6.0000 | MEDICATED_PAD | Freq: Every day | CUTANEOUS | Status: DC
  Administered 2023-09-19 – 2023-09-29 (×8): 6 via TOPICAL

## 2023-09-18 MED ORDER — SODIUM CHLORIDE 0.9 % IV SOLN: INTRAVENOUS | Status: DC

## 2023-09-18 MED ORDER — ONDANSETRON HCL 4 MG PO TABS
4.0000 mg | ORAL_TABLET | Freq: Two times a day (BID) | ORAL | Status: DC | PRN
  Administered 2023-09-23: 4 mg
  Filled 2023-09-18: qty 1

## 2023-09-18 MED ORDER — SUCCINYLCHOLINE CHLORIDE 20 MG/ML IJ SOLN
INTRAMUSCULAR | Status: DC | PRN
Start: 2023-09-18 — End: 2023-09-18
  Administered 2023-09-18: 150 mg via INTRAVENOUS

## 2023-09-18 MED ORDER — TERAZOSIN HCL 1 MG PO CAPS: 2.0000 mg | ORAL_CAPSULE | Freq: Every day | ORAL | Status: DC

## 2023-09-18 MED ORDER — SODIUM BICARBONATE 8.4 % IV SOLN
INTRAVENOUS | Status: DC
  Filled 2023-09-18 (×2): qty 150
  Filled 2023-09-18 (×4): qty 1000

## 2023-09-18 MED ORDER — SODIUM BICARBONATE 8.4 % IV SOLN
INTRAVENOUS | Status: AC
  Filled 2023-09-18: qty 50

## 2023-09-18 MED ORDER — PROPOFOL 1000 MG/100ML IV EMUL
5.0000 ug/kg/min | INTRAVENOUS | Status: DC
  Administered 2023-09-18: 10 ug/kg/min via INTRAVENOUS
  Administered 2023-09-18: 50 ug/kg/min via INTRAVENOUS
  Administered 2023-09-19: 25 ug/kg/min via INTRAVENOUS
  Filled 2023-09-18: qty 100

## 2023-09-18 MED ORDER — PHENYLEPHRINE HCL-NACL 20-0.9 MG/250ML-% IV SOLN
INTRAVENOUS | Status: AC
  Filled 2023-09-18: qty 250

## 2023-09-18 MED ORDER — ORAL CARE MOUTH RINSE: 15.0000 mL | OROMUCOSAL | Status: DC | PRN

## 2023-09-18 MED ORDER — FENTANYL BOLUS VIA INFUSION
50.0000 ug | INTRAVENOUS | Status: DC | PRN
  Administered 2023-09-18: 100 ug via INTRAVENOUS

## 2023-09-18 MED ORDER — ONDANSETRON HCL 4 MG PO TABS: 4.0000 mg | ORAL_TABLET | Freq: Two times a day (BID) | ORAL | Status: DC | PRN

## 2023-09-18 MED ORDER — ORAL CARE MOUTH RINSE
15.0000 mL | OROMUCOSAL | Status: DC
  Administered 2023-09-18 – 2023-09-19 (×9): 15 mL via OROMUCOSAL

## 2023-09-18 MED ORDER — PREDNISONE 5 MG PO TABS: 5.0000 mg | ORAL_TABLET | Freq: Every day | ORAL | Status: DC

## 2023-09-18 MED ORDER — PROPOFOL 1000 MG/100ML IV EMUL
INTRAVENOUS | Status: AC
  Filled 2023-09-18: qty 100

## 2023-09-18 MED ORDER — POLYETHYLENE GLYCOL 3350 17 G PO PACK: 17.0000 g | PACK | Freq: Every day | ORAL | Status: DC | PRN

## 2023-09-18 MED ORDER — TERAZOSIN HCL 1 MG PO CAPS
2.0000 mg | ORAL_CAPSULE | Freq: Every day | ORAL | Status: DC
  Administered 2023-09-18 – 2023-09-22 (×4): 2 mg
  Filled 2023-09-18 (×6): qty 2

## 2023-09-18 MED ORDER — ROSUVASTATIN CALCIUM 5 MG PO TABS
10.0000 mg | ORAL_TABLET | Freq: Every day | ORAL | Status: DC
  Administered 2023-09-19: 10 mg
  Filled 2023-09-18: qty 2

## 2023-09-18 MED ORDER — LACTATED RINGERS IV BOLUS
2000.0000 mL | Freq: Once | INTRAVENOUS | Status: AC
  Administered 2023-09-18: 2000 mL via INTRAVENOUS

## 2023-09-18 MED ORDER — ACETAMINOPHEN 325 MG PO TABS
650.0000 mg | ORAL_TABLET | ORAL | Status: DC | PRN
  Administered 2023-09-20 – 2023-09-22 (×4): 650 mg
  Filled 2023-09-18 (×5): qty 2

## 2023-09-18 MED ORDER — PANTOPRAZOLE SODIUM 40 MG IV SOLR
40.0000 mg | INTRAVENOUS | Status: DC
  Administered 2023-09-18 – 2023-09-19 (×2): 40 mg via INTRAVENOUS
  Filled 2023-09-18 (×2): qty 10

## 2023-09-18 MED ORDER — POTASSIUM CHLORIDE 10 MEQ/100ML IV SOLN
10.0000 meq | INTRAVENOUS | Status: AC
  Administered 2023-09-18 (×4): 10 meq via INTRAVENOUS
  Filled 2023-09-18 (×4): qty 100

## 2023-09-18 MED ORDER — SULFAMETHOXAZOLE-TRIMETHOPRIM 400-80 MG PO TABS: 1.0000 | ORAL_TABLET | ORAL | Status: DC

## 2023-09-18 MED ORDER — ENOXAPARIN SODIUM 30 MG/0.3ML IJ SOSY: 30.0000 mg | PREFILLED_SYRINGE | INTRAMUSCULAR | Status: DC

## 2023-09-18 MED ORDER — ROSUVASTATIN CALCIUM 5 MG PO TABS: 10.0000 mg | ORAL_TABLET | Freq: Every day | ORAL | Status: DC

## 2023-09-18 MED ORDER — SULFAMETHOXAZOLE-TRIMETHOPRIM 400-80 MG PO TABS
1.0000 | ORAL_TABLET | ORAL | Status: DC
  Filled 2023-09-18: qty 1

## 2023-09-18 MED ORDER — EPINEPHRINE 0.3 MG/0.3ML IJ SOAJ
0.3000 mg | Freq: Once | INTRAMUSCULAR | Status: AC
  Administered 2023-09-18: 0.3 mg via INTRAMUSCULAR

## 2023-09-18 MED ORDER — PREDNISONE 5 MG PO TABS
5.0000 mg | ORAL_TABLET | Freq: Every day | ORAL | Status: DC
  Administered 2023-09-19: 5 mg
  Filled 2023-09-18 (×2): qty 1

## 2023-09-18 MED ORDER — NOREPINEPHRINE 4 MG/250ML-% IV SOLN
0.0000 ug/min | INTRAVENOUS | Status: DC
  Administered 2023-09-18 – 2023-09-19 (×2): 10 ug/min via INTRAVENOUS
  Filled 2023-09-18: qty 250

## 2023-09-18 MED ORDER — SODIUM CHLORIDE 0.9 % IV SOLN: INTRAVENOUS | Status: DC | PRN

## 2023-09-18 MED ORDER — SODIUM BICARBONATE 8.4 % IV SOLN
50.0000 meq | Freq: Once | INTRAVENOUS | Status: AC
  Administered 2023-09-18: 50 meq via INTRAVENOUS

## 2023-09-18 MED ORDER — PANTOPRAZOLE SODIUM 40 MG PO TBEC
40.0000 mg | DELAYED_RELEASE_TABLET | Freq: Every day | ORAL | Status: DC
Start: 2023-09-18 — End: 2023-09-18

## 2023-09-18 NOTE — ED Notes (Signed)
Pt back from MRI with RN and RT at this time

## 2023-09-18 NOTE — Progress Notes (Signed)
Subjective:  Patient ID: [name], male    DOB: 1976-10-03  Age: 47 y.o. MRN: [id number]  CC: Urinary Tract Infection and Hypertension   HPI [name] presents for f/up ----  Discussed the use of AI scribe software for clinical note transcription with the patient, who gave verbal consent to proceed.  History of Present Illness   The patient, with a history of kidney transplant, presents for follow-up after a recent urinary tract infection (UTI). He denies dysuria and hematuria, but initially reported cloudy and malodorous urine. He was treated with cephalexin in addition to her regular regimen of Bactrim, which he takes prophylactically for his transplant. He denies any current symptoms of UTI, including fever, chills, chest pain, shortness of breath, dizziness, and lightheadedness.  The patient's blood pressure is well controlled at home, with regular monitoring in the morning and evening. He has a home blood pressure monitor and her wife, a Interior and spatial designer of nursing, ensures she checks it regularly.      [name]:[id number] DOB: [birth year] DOA: 09/03/2023   PCP: [name], MD   Admit date: 09/03/2023 Discharge date: 09/06/2023   Admitted From: Home Disposition: Home   Recommendations for Outpatient Follow-up:  Follow up with PCP in 1-2 weeks Continue Keflex to complete treatment course for E. coli urinary tract infection Increase carvedilol to 6.25 mg p.o. twice daily for poorly controlled hypertension. Please obtain BMP in one week to reassess creatinine/renal function Continue to monitor blood pressure outpatient and may need further adjustments in antihypertensive regimen.   Home Health: No Equipment/Devices: None   Discharge Condition: Stable CODE STATUS: Full code Diet recommendation: Heart healthy diet   History of present illness:   [name] is a 47 y.o. 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  Anytime the patient gets up or turns his head certain ways he feels like the room spinning around him.  Also reported having lightheadedness like he could pass out, but denies having any falls or loss of consciousness.  Patient was prescribed meclizine by his primary, but he reports no improvement in symptoms with taking the medication.  He initially had decreased appetite, but reports it has returned.  Associated symptoms included cloudy appearance of urine and foul odor that has also been present.  Denies having any significant fever, nausea, vomiting, abdominal pain, flank pain, or dysuria.     He has been admitted in the hospital with similar symptoms back in July found to have a E. coli urinary tract infection that was susceptible to everything except for Bactrim.    In the emergency department patient was noted to have a temperature of 100.2 F with blood pressures elevated up to 189/80, and all other vital signs maintained.  Labs from 9/8 significant for WBC 9.8, lactic acid 1.1, creatinine 2.11, BUN 16, and glucose 163.  CT scan of the head did not note any acute abnormality.  Urinalysis positive for moderate, large leukocytes, few bacteria, and greater than 50 WBCs.  Patient having given 1 L lactated Ringer's and Rocephin 1 g IV.  EDP consulted [facility] for admission for further evaluation management of UTI, dizziness.   Hospital course:   E. coli urinary tract infection Patient reports having cloudy urine with foul odor.  Urinalysis  positive for moderate, large leukocytes, few bacteria, and greater than 50 WBCs.  Last admitted into the hospital back in July with an E. coli  urinary tract infection that was resistant only to Bactrim.  Patient was initially placed on ceftriaxone  and will discharge on Keflex 500 mg p.o. twice daily to complete 10-day course given his immunocompromise state.  Outpatient follow-up with PCP.   Acute kidney injury superimposed on chronic kidney disease IIIa On admission creatinine elevated at 2.11 with BUN 16.  Creatinine previously noted to range around 1.4-1.7.  Renal transplant ultrasound with right lower quadrant renal transplant without hydronephrosis, patent transplant vasculature.  Patient's creatinine improved to 1.83 at time of discharge.  Recommend repeat BMP 1 week.   Outpatient Medications Prior to Visit  Medication Sig Dispense Refill   belatacept (NULOJIX) 250 MG SOLR injection Inject into the vein every 30 (thirty) days.     carvedilol (COREG) 6.25 MG tablet Take 1 tablet (6.25 mg total) by mouth 2 (two) times daily with a meal. 180 tablet 0   ergocalciferol (VITAMIN D2) 1.25 MG (50000 UT) capsule Take 1 capsule (50,000 Units total) by mouth once a week. (Patient taking differently: Take 1 capsule by mouth once a week. Wednesday) 4 capsule 3   fluocinonide cream (LIDEX) 0.05 % Apply one application to affected areas twice daily 120 g 2   hydrALAZINE (APRESOLINE) 50 MG tablet Take 1 tablet (50 mg total) by mouth 4 (four) times daily. (Patient taking differently: Take 50 mg by mouth 2 (two) times daily.) 360 tablet 0   meclizine (ANTIVERT) 25 MG tablet Take 25 mg by mouth 3 (three) times daily as needed for dizziness or nausea.     metoCLOPramide (REGLAN) 10 MG tablet Take 10 mg by mouth 2 (two) times daily.     mycophenolate (MYFORTIC) 180 MG EC tablet Take 360 mg by mouth 2 (two) times daily.     omeprazole (PRILOSEC) 40 MG capsule Take 40 mg by mouth daily.     ondansetron (ZOFRAN) 4 MG tablet Take 4 mg by mouth 2 (two) times daily as needed for nausea or vomiting.     predniSONE (DELTASONE) 5 MG tablet Take 5 mg by mouth daily.     rosuvastatin (CRESTOR) 10 MG tablet Take 1 tablet (10 mg total) by mouth daily. 90 tablet 1    sulfamethoxazole-trimethoprim (BACTRIM,SEPTRA) 400-80 MG per tablet Take 1 tablet by mouth every Monday, Wednesday, and Friday.      terazosin (HYTRIN) 2 MG capsule Take 1 capsule (2 mg total) by mouth at bedtime. 90 capsule 0   No facility-administered medications prior to visit.    ROS Review of Systems  Constitutional: Negative.  Negative for appetite change, fatigue and unexpected weight change.  HENT: Negative.    Eyes: Negative.   Respiratory: Negative.  Negative for chest tightness, shortness of breath and wheezing.   Cardiovascular:  Negative for chest pain, palpitations and leg swelling.  Gastrointestinal:  Negative for abdominal pain, diarrhea, nausea and vomiting.  Genitourinary:  Negative for difficulty urinating, dysuria, flank pain, frequency, hematuria, scrotal swelling and testicular pain.  Musculoskeletal:  Positive for gait problem.  Skin: Negative.   Neurological:  Negative for dizziness and weakness.  Hematological:  Negative for adenopathy. Does not bruise/bleed easily.  Psychiatric/Behavioral: Negative.      Objective:  BP 136/74 (BP Location: Right Arm, Patient Position: Sitting, Cuff Size: Large)   Pulse 73   Temp 98 F (36.7 C) (Oral)   Ht  5\' 10"  (1.778 m)   Wt 190 lb (86.2 kg)   SpO2 98%   BMI 27.26 kg/m   BP Readings from Last 3 Encounters:  09/18/23 136/74  09/11/23 (!) 177/60  09/06/23 (!) 172/80    Wt Readings from Last 3 Encounters:  09/18/23 190 lb (86.2 kg)  09/11/23 190 lb (86.2 kg)  09/03/23 195 lb 1.7 oz (88.5 kg)    Physical Exam Vitals reviewed.  Constitutional:      General: He is not in acute distress.    Appearance: He is not toxic-appearing or diaphoretic.  HENT:     Nose: Nose normal.     Mouth/Throat:     Mouth: Mucous membranes are moist.  Eyes:     General: No scleral icterus.    Conjunctiva/sclera: Conjunctivae normal.  Cardiovascular:     Rate and Rhythm: Normal rate and regular rhythm.     Heart sounds: No  murmur heard. Pulmonary:     Effort: Pulmonary effort is normal.     Breath sounds: No stridor. No wheezing, rhonchi or rales.  Abdominal:     General: Abdomen is flat.     Palpations: There is no mass.     Tenderness: There is no abdominal tenderness. There is no guarding.     Hernia: No hernia is present.  Musculoskeletal:     Cervical back: Neck supple.  Lymphadenopathy:     Cervical: No cervical adenopathy.  Skin:    General: Skin is warm and dry.  Neurological:     General: No focal deficit present.     Mental Status: He is alert. Mental status is at baseline.  Psychiatric:        Mood and Affect: Mood normal.        Behavior: Behavior normal.     Lab Results  Component Value Date   WBC 9.5 09/05/2023   HGB 15.4 09/05/2023   HCT 48.2 09/05/2023   PLT 262 09/05/2023   GLUCOSE 117 (H) 09/06/2023   CHOL 173 07/20/2023   TRIG 189.0 (H) 07/20/2023   HDL 45.00 07/20/2023   LDLDIRECT 116 (H) 01/21/2011   LDLCALC 90 07/20/2023   ALT 8 09/03/2023   AST 12 (L) 09/03/2023   NA 140 09/06/2023   K 3.1 (L) 09/06/2023   CL 111 09/06/2023   CREATININE 1.83 (H) 09/06/2023   BUN 10 09/06/2023   CO2 19 (L) 09/06/2023   TSH 1.04 07/20/2023   PSA 2.30 09/18/2023   INR 1.1 07/04/2023   HGBA1C 5.8 09/12/2023   MICROALBUR 0.7 08/26/2016    US Renal Transplant w/Doppler  Result Date: 09/04/2023 CLINICAL DATA:  Acute kidney injury EXAM: ULTRASOUND OF RENAL TRANSPLANT WITH RENAL DOPPLER ULTRASOUND TECHNIQUE: Ultrasound examination of the renal transplant was performed with gray-scale, color and duplex doppler evaluation. COMPARISON:  04/11/2018 FINDINGS: Transplant kidney location: RLQ Transplant Kidney: Renal measurements: 3.3 x 5.2 x 6.1 cm = volume: . Normal in size and parenchymal echogenicity. No evidence of mass or hydronephrosis. No peri-transplant fluid collection seen. Color flow in the main renal artery:  Yes Color flow in the main renal vein:  Yes Duplex Doppler  Evaluation: Main Renal Artery Velocity: 103.9 cm/sec Main Renal Artery Resistive Index: 0.8 Venous waveform in main renal vein:  present/absent Intrarenal resistive index in upper pole:  0.7 (normal 0.6-0.8; equivocal 0.8-0.9; abnormal >= 0.9) Intrarenal resistive index in lower pole: 0.7 (normal 0.6-0.8; equivocal 0.8-0.9; abnormal >= 0.9) Bladder: Normal for degree of bladder distention. Other findings:  None. IMPRESSION: Right lower quadrant renal transplant without hydronephrosis. Patent transplant vasculature. Electronically Signed   By: Charline Bills M.D.   On: 09/04/2023 10:53   CT HEAD WO CONTRAST  Result Date: 09/04/2023 CLINICAL DATA:  Vertigo. EXAM: CT HEAD WITHOUT CONTRAST TECHNIQUE: Contiguous axial images were obtained from the base of the skull through the vertex without intravenous contrast. RADIATION DOSE REDUCTION: This exam was performed according to the departmental dose-optimization program which includes automated exposure control, adjustment of the mA and/or kV according to patient size and/or use of iterative reconstruction technique. COMPARISON:  06/25/2023 FINDINGS: Brain: There is no evidence for acute hemorrhage, hydrocephalus, mass lesion, or abnormal extra-axial fluid collection. No definite CT evidence for acute infarction. Vascular: No hyperdense vessel or unexpected calcification. Skull: No evidence for fracture. No worrisome lytic or sclerotic lesion. Sinuses/Orbits: The visualized paranasal sinuses and mastoid air cells are clear. Visualized portions of the globes and intraorbital fat are unremarkable. Other: None. IMPRESSION: No acute intracranial abnormality. Electronically Signed   By: Kennith Center M.D.   On: 09/04/2023 06:05    Assessment & Plan:   Acute cystitis without hematuria- UA is normal now. -     Urinalysis, Routine w reflex microscopic; Future -     CULTURE, URINE COMPREHENSIVE; Future  PSA elevation- His PSA is down to 2.3. -     PSA;  Future  Hypertension secondary to other renal disorders- BP is adequately well controlled.     Follow-up: Return in about 3 months (around 12/18/2023).  Sanda Linger, MD

## 2023-09-18 NOTE — Progress Notes (Signed)
PT was transported to CT scan, MRI, and back to Trauma rm A without complications.

## 2023-09-18 NOTE — Progress Notes (Signed)
Date and time results received: 09/19/23 0001   Test: Troponin Critical Value: 147  Name of Provider Notified: Dr. Delia Chimes  Orders Received? Or Actions Taken?:  Q4 EKG, Troponin Labs q 2 hours, and ABG 0200  Delcie Roch, RN

## 2023-09-18 NOTE — Code Documentation (Signed)
Stroke Response Nurse Documentation Code Documentation  [name] is a 47 y.o. 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 Patient recently discharged 9/11 with E.Coli UTI. On No antithrombotic.   Patient from infusion clinic where he was receiving an antiviral infusion and became diaphoretic, hypotensive and then unresponsive. LKW at 1457. Confirmed with clinic. Given benadryl at clinic for possible medication allergic reaction. No response to medication. Bagged by EMS en route. SBP in 60s. Upon arrival to ED, patient was taken to room and given IM epi. He opened his eyes and moved all extremities but unable to speak and left facial droop noted. Code stroke was activated by ED.   Stroke team met patient in ED room post intubation for airway protection. Sedation and pressor started. Patient to CT with team. NIHSS 16, see documentation for details and code stroke times. Patient with decreased LOC, disoriented, not following commands, left facial droop, bilateral arm weakness, bilateral leg weakness, bilateral decreased sensation, Global aphasia , and dysarthria  on exam. The following imaging was completed:  CT Head and MRI. Difficulty obtaining needed PIV access for CTA and has elevated creatinine with history of kidney transplant. Decision made to take to MRI. Patient is not a candidate for IV Thrombolytic due to watershed stroke seen on MRI and inability to assess sedated patient. Patient is not a candidate for IR due to no LVO.   Care Plan: admit to CCM. Q2 hour NIHSS and VS per order.    Bedside handoff with ED RN.    Ferman Hamming Stroke Response RN

## 2023-09-18 NOTE — Progress Notes (Incomplete)
Date and time results received: 09/18/23   (use smartphrase ".now" to insert current time)  Test: Troponin Critical Value: 147  Name of Provider Notified: Dr.  Jarrett Ables Received? Or Actions Taken?: {ED Critical Value actions [phone]

## 2023-09-18 NOTE — Progress Notes (Addendum)
eLink Physician-Brief Progress Note Patient Name: [name] DOB: [birth year] MRN: [id number]   Date of Service  09/18/2023  HPI/Events of Note  47 year old with a history of essential hypertension, type 2 diabetes mellitus, peripheral arterial disease status post bilateral below-knee amputation status post renal and pancreatic transplant on chronic immunosuppressive therapy who presents with acute encephalopathy.  Patient was found to be now protecting his airway was intubated emergently.  Stroke alert was called and MRI was ordered.  On presentation, the patient was hypertensive and was ventilated via 50% FiO2 for 100% SpO2.  Minimally sedated on propofol and fentanyl infusions.  Low-dose norepinephrine  Last gas at 1700 consistent with metabolic acidosis but adequate ventilation and oxygenation.  Metabolic panel consistent with none anion gap metabolic acidosis with severe polycythemia and grossly negative urinalysis.  UDS negative.  MRI concerning for multifocal subcortical infarct.  Left ICA occlusion present.  Appears to have new pupillary deficits right greater than left.  Right no longer as briskly reactive.  Corneals intact, cough and gag intact, no large swings in hemodynamics.  eICU Interventions  Will repeat CT head now  Switch sodium chloride infusion to bicarb infusion  Obtain arterial line, ABG  Enoxaparin for DVT prophylaxis Pantoprazole for GI prophylaxis   2149 -repeat CT head reviewed without evidence of acute intracranial hemorrhage.  Upon return, evidence of ST changes on telemetry.  ECG with ST depressions in the inferior leads.  Will request routine cardiology consultation, patient poor candidate for no ST elevations.  Will repeat ECG in 4 hours, trend trops.  Escalating norepi requirements, will add LR boluses.  Significant dysrhythmias with runs of VT, suspect secondary to acidosis.  Will add 1 ampoule of bicarb in addition to drip.  Updated wife at  bedside.  Intervention Category Evaluation Type: New Patient Evaluation  [name] 09/18/2023, 8:33 PM

## 2023-09-18 NOTE — Consult Note (Addendum)
Neurology Consultation Reason for Consult: code stroke Referring Physician: Dr. Wilkie Aye  CC: unresponsive  History is obtained from: chart review  HPI: [name] is a 47 y.o. 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 Pt given benadryl at infusion center. Per EMS, patient was severely hypotensive with SBP into the 60s.  On ED arrival, patient was unresponsive. IM epinephrine was given and patient then opened his eyes and moved all extremities, unable to speak. ED provider noted a facial droop and called a CODE STROKE.  On our exam, patient just received RSI drugs and was being intubated for airway protection.  Did not open eyes or withdraw to pain, positive cough and gag. Ct Head was negative for acute process. Patient was taken for MRI and MRA. Due to his elevated creatinine and s/p kidney transplant, we did not attempt a CTA.  Patient was recently admitted to [facility] 9/9 for dizziness x 2 weeks. He was found to have a E. Coli UTI, poorly controlled htn and was discharged 9/11. Patient is not on any blood thinners or OAC/DAPT therapies.   Patient was taken for stat MRI for treatment decision which showed the following:  1. Scattered small foci of acute subcortical infarction in the left occipital lobe and posterior left frontal lobe. 2. Faint diffusion signal abnormality along the left parietal lobe may reflect evolving infarct versus seizure related cytotoxic edema. 3. 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  TNK was not administered 2/2 inability of patient to consent or discuss inclusion/exclusion criteria or verify last known well, also strokes appear watershed  in etiology  LKW: unclear tnk given?: no, watershed stroke and inability to fully assess patient for deficits and exact last known well.  Premorbid modified rankin scale: 0   FAO:ZHYQMV to obtain due to altered mental status.   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   Diabetes mellitus without complication [facility])    no since pancreas transplant   Dialysis patient [facility]) 04/18/2012   "[facility]; [place], [place], [place]"   Gastroparesis    Gastropathy    GERD (gastroesophageal reflux disease)    Hypertension    MRSA infection    over 10 years ago per patient. in legs    Family History  Problem Relation Age of Onset   Hypertension Mother    Diabetes Mother    Kidney disease Mother    Diabetes Maternal Grandmother    Diabetes Paternal Grandmother    Diabetes Other    Hypertension Other    Lung cancer Maternal Aunt    Colon cancer Neg Hx    Esophageal cancer Neg Hx    Rectal cancer Neg Hx    Stomach cancer Neg Hx     Social History:  reports that he quit smoking about 14 months ago. His smoking use included cigars. He has never used smokeless tobacco. He reports that he does not currently use drugs after having used the  following drugs: Marijuana. Frequency: 3.00 times per week. He reports that he does not drink alcohol.  Exam: Current vital signs: BP (!) 140/83   Pulse 67   Resp (!) 21   SpO2 100%  Vital signs in last 24 hours: Temp:  [98 F (36.7 C)] 98 F (36.7 C) (09/23 1030) Pulse Rate:  [67-73] 67 (09/23 1608) Resp:  [16-25] 21 (09/23 1738) BP: (99-149)/(66-86) 140/83 (09/23 1738) SpO2:  [98 %-100 %] 100 % (09/23 1608) FiO2 (%):  [100 %] 100 % (09/23 1559) Weight:  [86.2 kg] 86.2 kg (09/23 1030)   Physical Exam  Patient seems    Neuro: Patient is intubated and sedated, recently received paralytic for RSI. Does not open eyes to noxious stimuli, does not follow commands. No withdraw to pain in any extremity.   I have reviewed labs in epic and the results pertinent to this consultation are: CBG: 201 -> 249 ABG: 7.13/39.7/557/13.2/15.0 Cr:1.96 Ca: 7.6 Albumin: 2.5 UA: pending  Hgb: 20.3 Na: 134 K: 3.2  I have reviewed the images obtained:  Ct Head:  - No acute intracranial hemorrhage or evidence of acute large vessel territory infarct.  - ASPECT score is 10. - Stable mild chronic small-vessel disease.  MRI/MRA: 1. Scattered small foci of acute subcortical infarction in the left occipital lobe and posterior left frontal lobe. 2. Faint diffusion signal abnormality along the left parietal lobe may reflect evolving infarct versus seizure related cytotoxic edema. 3. 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  CNS imaging reviewed by Dr. Selina Cooley who discussed it by phone with Dr. 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 secondary to severe hypotensive episode  Recommendations: - Frequent Neuro checks per stroke unit protocol - TTE - Lipid panel - Statin - will be started if LDL>70 or otherwise medically indicated  On Crestor 10mg  home med - A1C - ASA 81mg  daily + plavix 75mg  daily x21 days f/b ASA 81mg  daily monotherapy after that - DVT ppx - lovenox - Smoking cessation - will counsel patient - SBP goal - <220, PRN labetalol if HR>60 and PRN Hydralazine if HR<60 - Telemetry monitoring for arrhythmia - 72h - Swallow screen - will be performed prior to PO intake - Stroke education - will be given - PT/OT/SLP - Dispo: ICU  - stroke team will follow  This patient is critically ill and at significant risk of neurological worsening, death and care requires constant monitoring of vital signs, hemodynamics,respiratory and cardiac monitoring, neurological  assessment, discussion with family, other specialists and medical decision making of high complexity. I spent 95 minutes of neurocritical care time  in the care of  this patient. This was time spent independent of any time provided by nurse practitioner or PA.  Bing Neighbors, MD [facility] [phone]  If 7pm- 7am, please page neurology on call as listed in AMION.

## 2023-09-18 NOTE — Procedures (Signed)
Arterial Catheter Insertion Procedure Note  [name]  [id number]  [birth year]  Date:09/18/23  Time:9:36 PM    Provider Performing: Leafy Half    Procedure: Insertion of Arterial Line (40981) with US guidance (19147)   Indication(s) Blood pressure monitoring and/or need for frequent ABGs  Consent Risks of the procedure as well as the alternatives and risks of each were explained to the patient and/or caregiver.  Consent for the procedure was obtained and is signed in the bedside chart  Anesthesia None   Time Out Verified patient identification, verified procedure, site/side was marked, verified correct patient position, special equipment/implants available, medications/allergies/relevant history reviewed, required imaging and test results available.   Sterile Technique Maximal sterile technique including full sterile barrier drape, hand hygiene, sterile gown, sterile gloves, mask, hair covering, sterile ultrasound probe cover (if used).   Procedure Description Area of catheter insertion was cleaned with chlorhexidine and draped in sterile fashion. With real-time ultrasound guidance an arterial catheter was placed into the right radial artery.  Appropriate arterial tracings confirmed on monitor.     Complications/Tolerance None; patient tolerated the procedure well.   EBL Minimal   Specimen(s) None

## 2023-09-18 NOTE — H&P (Signed)
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  Patient was at his infusion center today getting his Belatacept infusion for chronic immunosuppressive therapy and during his infusion he became hypotensive and had full body burning sensation.  Patient did receive Benadryl at the infusion center and patient was transported to the [facility] for further evaluation and management.  On initial arrival to the emergency department patient was altered, and had blood pressure of 99/86.  Patient was altered, and minimally responsive.  Patient was given IM epinephrine with mild improvement in mental status.  Patient was mumbling and nonsensical, and therefore with concern for not protecting his own airway, patient was emergently intubated.  Patient subsequently had code stroke called.  Patient was admitted to the ICU for further evaluation management of altered mental status.  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  Unable to obtain history secondary to intubation and sedation.  Objective   Blood pressure 106/69, pulse 70, resp. rate 18, SpO2 100%.     Vent Mode: PRVC FiO2 (%):  [100 %] 100 % Set Rate:  [18 bmp] 18 bmp Vt Set:  [580 mL] 580 mL PEEP:  [5 cmH20] 5 cmH20 Plateau Pressure:  [16 cmH20] 16 cmH20  No intake or output data in the 24 hours ending 09/18/23 1725 There were no vitals filed for this visit.  Examination: General: intubated and sedated.  HENT: ETT in place. No scleral icterus Lungs: Clear Cardiovascular: HS normal Abdomen: Soft and nontender Extremities: Bilateral BKA Neuro: Sedated.  No response to painful stimuli. GU: Anuric.  Ancillary tests personally reviewed  Acceptable ABG. Creatinine 2. Hemoglobin 20. 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  Patient was subsequently intubated for airway protection.  Patient admitted for further evaluation and management of acute metabolic encephalopathy.  #Acute metabolic encephalopathy #Acute hypoxemic respiratory failure #Concern for anaphylactic reaction Patient presents to the emergency department after having episode of altered mental status, hypotension during infusion at his infusion center.  Patient was getting his Belatacept infusion today and became altered and hypotensive.  Unclear what this was, but patient was given Benadryl for potential allergic reaction and was sent to [facility] emergency department.  Patient was given epinephrine here, and patient was subsequently intubated for altered mental status with GCS of 3.  Patient did have a code stroke called in which CT head was unremarkable,.  No evidence of acute stroke per neurology.  Significant intracerebral vascular disease.  Suspect watershed ischemia from hypotension. -Continue ventilator support -VAP bundle -Try to wean off sedation as tolerated in  a.m. -Status post  epi -Follow-up MRI -Neurology following  #Erythrocytosis Could be related to dehydration or hemoconcentration.  Unclear why patient has significantly elevated hemoglobin at this time.  This could be also related to history of smoking.  Hemoglobin has been relatively high this year. -Monitor CBC -Hyperviscosity may have contributed to neurological event. -Reassess after gentle hydration.  #Hyponatremia #Hypokalemia Could be related to dehydration and poor p.o. intake.  Will replete potassium.  Sodium will be monitored.  Not significantly decreased. -Order IV potassium.  Will check mag as well.  #Metabolic acidosis Order lactate with bicarb at 11.  Will follow-up ABG.  Unclear what patient's bicarb is at 11.  No anion gap. 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  Creatinine is at 1.96.  Patient's baseline is around 1.3-1.6.  Will continue to monitor.  Could be transient. -Follow-up BMP in a.m. -Resume home prednisone 5 mg daily -Resume home mycophenolate 360 mg twice daily  #PAD status post bilateral BKA Patient has a past medical history of PAD.  Patient status post bilateral BKA.  Patient is on rosuvastatin 10 mg daily.  Will resume this. -Resume home Crestor 10 mg daily  Best Practice (right click and "Reselect all SmartList Selections" daily)   Diet/type: NPO w/ oral meds DVT prophylaxis: LMWH GI prophylaxis: PPI Lines: N/A Foley:  N/A Code Status:  full code  CRITICAL CARE Performed by: Lynnell Catalan   Total critical care time: 40 minutes  Critical care time was exclusive of separately billable procedures and treating other patients.  Critical care was necessary to treat or prevent imminent or life-threatening deterioration.  Critical care was time spent personally by me on the following activities: development of treatment plan with patient and/or surrogate as well as  nursing, discussions with consultants, evaluation of patient's response to treatment, examination of patient, obtaining history from patient or surrogate, ordering and performing treatments and interventions, ordering and review of laboratory studies, ordering and review of radiographic studies, pulse oximetry, re-evaluation of patient's condition and participation in multidisciplinary rounds.  Lynnell Catalan, MD [facility] ICU Physician [facility]  Pager: [phone] Mobile: [phone] After hours: [phone].

## 2023-09-18 NOTE — Patient Instructions (Signed)
Hypertension, Adult High blood pressure (hypertension) is when the force of blood pumping through the arteries is too strong. The arteries are the blood vessels that carry blood from the heart throughout the body. Hypertension forces the heart to work harder to pump blood and may cause arteries to become narrow or stiff. Untreated or uncontrolled hypertension can lead to a heart attack, heart failure, a stroke, kidney disease, and other problems. A blood pressure reading consists of a higher number over a lower number. Ideally, your blood pressure should be below 120/80. The first ("top") number is called the systolic pressure. It is a measure of the pressure in your arteries as your heart beats. The second ("bottom") number is called the diastolic pressure. It is a measure of the pressure in your arteries as the heart relaxes. What are the causes? The exact cause of this condition is not known. There are some conditions that result in high blood pressure. What increases the risk? Certain factors may make you more likely to develop high blood pressure. Some of these risk factors are under your control, including: Smoking. Not getting enough exercise or physical activity. Being overweight. Having too much fat, sugar, calories, or salt (sodium) in your diet. Drinking too much alcohol. Other risk factors include: Having a personal history of heart disease, diabetes, high cholesterol, or kidney disease. Stress. Having a family history of high blood pressure and high cholesterol. Having obstructive sleep apnea. Age. The risk increases with age. What are the signs or symptoms? High blood pressure may not cause symptoms. Very high blood pressure (hypertensive crisis) may cause: Headache. Fast or irregular heartbeats (palpitations). Shortness of breath. Nosebleed. Nausea and vomiting. Vision changes. Severe chest pain, dizziness, and seizures. How is this diagnosed? This condition is diagnosed by  measuring your blood pressure while you are seated, with your arm resting on a flat surface, your legs uncrossed, and your feet flat on the floor. The cuff of the blood pressure monitor will be placed directly against the skin of your upper arm at the level of your heart. Blood pressure should be measured at least twice using the same arm. Certain conditions can cause a difference in blood pressure between your right and left arms. If you have a high blood pressure reading during one visit or you have normal blood pressure with other risk factors, you may be asked to: Return on a different day to have your blood pressure checked again. Monitor your blood pressure at home for 1 week or longer. If you are diagnosed with hypertension, you may have other blood or imaging tests to help your health care provider understand your overall risk for other conditions. How is this treated? This condition is treated by making healthy lifestyle changes, such as eating healthy foods, exercising more, and reducing your alcohol intake. You may be referred for counseling on a healthy diet and physical activity. Your health care provider may prescribe medicine if lifestyle changes are not enough to get your blood pressure under control and if: Your systolic blood pressure is above 130. Your diastolic blood pressure is above 80. Your personal target blood pressure may vary depending on your medical conditions, your age, and other factors. Follow these instructions at home: Eating and drinking  Eat a diet that is high in fiber and potassium, and low in sodium, added sugar, and fat. An example of this eating plan is called the DASH diet. DASH stands for Dietary Approaches to Stop Hypertension. To eat this way: Eat  plenty of fresh fruits and vegetables. Try to fill one half of your plate at each meal with fruits and vegetables. Eat whole grains, such as whole-wheat pasta, brown rice, or whole-grain bread. Fill about one  fourth of your plate with whole grains. Eat or drink low-fat dairy products, such as skim milk or low-fat yogurt. Avoid fatty cuts of meat, processed or cured meats, and poultry with skin. Fill about one fourth of your plate with lean proteins, such as fish, chicken without skin, beans, eggs, or tofu. Avoid pre-made and processed foods. These tend to be higher in sodium, added sugar, and fat. Reduce your daily sodium intake. Many people with hypertension should eat less than 1,500 mg of sodium a day. Do not drink alcohol if: Your health care provider tells you not to drink. You are pregnant, may be pregnant, or are planning to become pregnant. If you drink alcohol: Limit how much you have to: 0-1 drink a day for women. 0-2 drinks a day for men. Know how much alcohol is in your drink. In the [place] one drink equals one 12 oz bottle of beer (355 mL), one 5 oz glass of wine (148 mL), or one 1 oz glass of hard liquor (44 mL). Lifestyle  Work with your health care provider to maintain a healthy body weight or to lose weight. Ask what an ideal weight is for you. Get at least 30 minutes of exercise that causes your heart to beat faster (aerobic exercise) most days of the week. Activities may include walking, swimming, or biking. Include exercise to strengthen your muscles (resistance exercise), such as Pilates or lifting weights, as part of your weekly exercise routine. Try to do these types of exercises for 30 minutes at least 3 days a week. Do not use any products that contain nicotine or tobacco. These products include cigarettes, chewing tobacco, and vaping devices, such as e-cigarettes. If you need help quitting, ask your health care provider. Monitor your blood pressure at home as told by your health care provider. Keep all follow-up visits. This is important. Medicines Take over-the-counter and prescription medicines only as told by your health care provider. Follow directions carefully. Blood  pressure medicines must be taken as prescribed. Do not skip doses of blood pressure medicine. Doing this puts you at risk for problems and can make the medicine less effective. Ask your health care provider about side effects or reactions to medicines that you should watch for. Contact a health care provider if you: Think you are having a reaction to a medicine you are taking. Have headaches that keep coming back (recurring). Feel dizzy. Have swelling in your ankles. Have trouble with your vision. Get help right away if you: Develop a severe headache or confusion. Have unusual weakness or numbness. Feel faint. Have severe pain in your chest or abdomen. Vomit repeatedly. Have trouble breathing. These symptoms may be an emergency. Get help right away. Call 911. Do not wait to see if the symptoms will go away. Do not drive yourself to the hospital. Summary Hypertension is when the force of blood pumping through your arteries is too strong. If this condition is not controlled, it may put you at risk for serious complications. Your personal target blood pressure may vary depending on your medical conditions, your age, and other factors. For most people, a normal blood pressure is less than 120/80. Hypertension is treated with lifestyle changes, medicines, or a combination of both. Lifestyle changes include losing weight, eating a healthy,  low-sodium diet, exercising more, and limiting alcohol. This information is not intended to replace advice given to you by your health care provider. Make sure you discuss any questions you have with your health care provider. Document Revised: 10/19/2021 Document Reviewed: 10/19/2021 Elsevier Patient Education  2024 [employer].

## 2023-09-18 NOTE — ED Provider Notes (Addendum)
[facility] EMERGENCY DEPARTMENT AT [facility] Provider Note   CSN: [id number] Arrival date & time: 09/18/23  1544  An emergency department physician performed an initial assessment on this suspected stroke patient at 1549.  History  Chief Complaint  Patient presents with   Code Stroke    TELLEY BOICE is a 47 y.o. male.  HPI 47 year old male with past medical history of renal and pancreas transplant, hypertension, type 2 diabetes, peripheral artery disease status post bilateral BKA's presenting from infusion center for evaluation of altered mental status.  Patient was reportedly receiving a belatacept infusion as chronic immunosuppressive therapy for his renal transplant.  While getting this infusion, he became markedly hypotensive after stating that he had a full body burning sensation.  He did receive Benadryl at the infusion center prior to being transported to the hospital.  With EMS, patient blood pressure was in the 60 systolic range. Patient himself unable to provide any additional history.    Home Medications Prior to Admission medications   Medication Sig Start Date End Date Taking? Authorizing Provider  belatacept (NULOJIX) 250 MG SOLR injection Inject into the vein every 30 (thirty) days.    [provider]  carvedilol (COREG) 6.25 MG tablet Take 1 tablet (6.25 mg total) by mouth 2 (two) times daily with a meal. 09/06/23 12/05/23  [place], Alvira Philips, DO  ergocalciferol (VITAMIN D2) 1.25 MG (50000 UT) capsule Take 1 capsule (50,000 Units total) by mouth once a week. Patient taking differently: Take 1 capsule by mouth once a week. Wednesday 05/19/23     fluocinonide cream (LIDEX) 0.05 % Apply one application to affected areas twice daily 01/25/23     hydrALAZINE (APRESOLINE) 50 MG tablet Take 1 tablet (50 mg total) by mouth 4 (four) times daily. Patient taking differently: Take 50 mg by mouth 2 (two) times daily. 07/22/23   [name], MD  meclizine  (ANTIVERT) 25 MG tablet Take 25 mg by mouth 3 (three) times daily as needed for dizziness or nausea. 08/28/23   [provider]  metoCLOPramide (REGLAN) 10 MG tablet Take 10 mg by mouth 2 (two) times daily.    [provider]  mycophenolate (MYFORTIC) 180 MG EC tablet Take 360 mg by mouth 2 (two) times daily. 07/06/13   [provider]  omeprazole (PRILOSEC) 40 MG capsule Take 40 mg by mouth daily. 02/01/16   [provider]  ondansetron (ZOFRAN) 4 MG tablet Take 4 mg by mouth 2 (two) times daily as needed for nausea or vomiting.    [provider]  predniSONE (DELTASONE) 5 MG tablet Take 5 mg by mouth daily. 06/20/13   [provider]  rosuvastatin (CRESTOR) 10 MG tablet Take 1 tablet (10 mg total) by mouth daily. 07/26/23   [name], MD  sulfamethoxazole-trimethoprim (BACTRIM,SEPTRA) 400-80 MG per tablet Take 1 tablet by mouth every Monday, Wednesday, and Friday.     [provider]  terazosin (HYTRIN) 2 MG capsule Take 1 capsule (2 mg total) by mouth at bedtime. 09/11/23   [name], MD      Allergies    Amlodipine and Lisinopril    Review of Systems   Review of Systems  Physical Exam Updated Vital Signs BP 126/71   Pulse 69   Temp (!) 97.2 F (36.2 C) (Axillary)   Resp 18   SpO2 100%  Physical Exam Constitutional:      General: He is in acute distress.  Appearance: He is ill-appearing.     Comments: Diffusely diaphoretic on arrival.  HENT:     Head: Normocephalic.     Right Ear: External ear normal.     Left Ear: External ear normal.     Mouth/Throat:     Mouth: Mucous membranes are moist.     Pharynx: Oropharynx is clear.  Eyes:     Comments: 2mm and reactive bilaterally  Cardiovascular:     Rate and Rhythm: Normal rate and regular rhythm.  Pulmonary:     Comments: Weak, sonorous respirations Abdominal:     General: Abdomen is flat.     Tenderness: There is no guarding or rebound.   Musculoskeletal:        General: No signs of injury.     Right lower leg: No edema.     Left lower leg: No edema.  Skin:    General: Skin is warm.  Neurological:     Mental Status: He is alert.     Comments: Not responsive on arrival to verbal, physical stimuli.  GCS of 3.  Left-sided facial droop.     ED Results / Procedures / Treatments   Labs (all labs ordered are listed, but only abnormal results are displayed) Labs Reviewed  CBC - Abnormal; Notable for the following components:      Result Value   RBC 6.58 (*)    Hemoglobin 20.3 (*)    HCT 65.0 (*)    RDW 18.6 (*)    All other components within normal limits  DIFFERENTIAL - Abnormal; Notable for the following components:   Abs Immature Granulocytes 0.08 (*)    All other components within normal limits  COMPREHENSIVE METABOLIC PANEL - Abnormal; Notable for the following components:   Sodium 134 (*)    Potassium 3.2 (*)    Chloride 112 (*)    CO2 11 (*)    Glucose, Bld 183 (*)    Creatinine, Ser 1.96 (*)    Calcium 7.6 (*)    Total Protein 5.4 (*)    Albumin 2.5 (*)    GFR, Estimated 41 (*)    All other components within normal limits  CBG MONITORING, ED - Abnormal; Notable for the following components:   Glucose-Capillary 201 (*)    All other components within normal limits  I-STAT CHEM 8, ED - Abnormal; Notable for the following components:   Potassium 3.2 (*)    Chloride 112 (*)    BUN 27 (*)    Creatinine, Ser 2.00 (*)    Glucose, Bld 175 (*)    Calcium, Ion 0.97 (*)    TCO2 13 (*)    Hemoglobin 21.8 (*)    HCT 64.0 (*)    All other components within normal limits  I-STAT ARTERIAL BLOOD GAS, ED - Abnormal; Notable for the following components:   pH, Arterial 7.130 (*)    pO2, Arterial 557 (*)    Bicarbonate 13.2 (*)    TCO2 14 (*)    Acid-base deficit 15.0 (*)    HCT 61.0 (*)    Hemoglobin 20.7 (*)    All other components within normal limits  CBG MONITORING, ED - Abnormal; Notable for the  following components:   Glucose-Capillary 249 (*)    All other components within normal limits  ETHANOL  RAPID URINE DRUG SCREEN, HOSP PERFORMED  URINALYSIS, ROUTINE W REFLEX MICROSCOPIC  PROTIME-INR  APTT  BLOOD GAS, ARTERIAL  TSH  TRIGLYCERIDES  CBC  MAGNESIUM  PHOSPHORUS  BASIC METABOLIC PANEL  MAGNESIUM  TRYPTASE  I-STAT CG4 LACTIC ACID, ED    EKG None  Radiology CT HEAD CODE STROKE WO CONTRAST  Result Date: 09/18/2023 CLINICAL DATA:  Code stroke. Neuro deficit, acute, stroke suspected. Unresponsive. EXAM: CT HEAD WITHOUT CONTRAST TECHNIQUE: Contiguous axial images were obtained from the base of the skull through the vertex without intravenous contrast. RADIATION DOSE REDUCTION: This exam was performed according to the departmental dose-optimization program which includes automated exposure control, adjustment of the mA and/or kV according to patient size and/or use of iterative reconstruction technique. COMPARISON:  Head CT 09/04/2023. FINDINGS: Brain: No acute intracranial hemorrhage. Stable mild chronic small-vessel disease. Cortical gray-white differentiation is preserved. No hydrocephalus or extra-axial collection. No mass effect or midline shift. Vascular: No hyperdense vessel or unexpected calcification. Skull: No calvarial fracture or suspicious bone lesion. Skull base is unremarkable. Sinuses/Orbits: No acute finding. Other: None. ASPECTS [facility] Stroke Program Early CT Score) - Ganglionic level infarction (caudate, lentiform nuclei, internal capsule, insula, M1-M3 cortex): 7 - Supraganglionic infarction (M4-M6 cortex): 3 Total score (0-10 with 10 being normal): 10 IMPRESSION: 1. No acute intracranial hemorrhage or evidence of acute large vessel territory infarct. ASPECT score is 10. 2. Stable mild chronic small-vessel disease. Code stroke imaging results were communicated on 09/18/2023 at 4:26 pm to provider Dr. Selina Cooley via secure text paging. Electronically Signed   By: Orvan Falconer M.D.   On: 09/18/2023 16:27    Procedures Procedure Name: Intubation Date/Time: 09/18/2023 4:45 PM  Performed by: Lyman Speller, MDPre-anesthesia Checklist: Emergency Drugs available, Suction available, Patient being monitored, Patient identified and Timeout performed Oxygen Delivery Method: Simple face mask Preoxygenation: Pre-oxygenation with 100% oxygen Induction Type: Rapid sequence Ventilation: Mask ventilation without difficulty Laryngoscope Size: Glidescope and 4 Grade View: Grade I Tube size: 7.5 mm Number of attempts: 1 Placement Confirmation: ETT inserted through vocal cords under direct vision, Positive ETCO2, Breath sounds checked- equal and bilateral and CO2 detector Secured at: 25 cm Tube secured with: ETT holder        Medications Ordered in ED Medications  fentaNYL in NS (69mcg/ml) infusion-PREMIX (100 mcg/hr Intravenous New Bag/Given 09/18/23 1600)  fentaNYL (SUBLIMAZE) bolus via infusion 50-100 mcg (100 mcg Intravenous Bolus from Bag 09/18/23 1607)  propofol (DIPRIVAN) 1000 MG/100ML infusion (67.672 mcg/kg/min  86.2 kg Intravenous Rate/Dose Change 09/18/23 1656)  phenylephrine (NEOSYNEPHRINE) 20-0.9 MG/250ML-% infusion (has no administration in time range)  norepinephrine (LEVOPHED) 4mg  in (0.016 mg/mL) premix infusion (2 mcg/min Intravenous Rate/Dose Change 09/18/23 1756)  polyethylene glycol (MIRALAX / GLYCOLAX) packet 17 g (has no administration in time range)  enoxaparin (LOVENOX) injection 30 mg (has no administration in time range)  insulin aspart (novoLOG) injection 0-9 Units (has no administration in time range)  acetaminophen (TYLENOL) tablet 650 mg (has no administration in time range)  ondansetron (ZOFRAN) tablet 4 mg (has no administration in time range)  pantoprazole (PROTONIX) injection 40 mg (has no administration in time range)  predniSONE (DELTASONE) tablet 5 mg (has no administration in time range)  rosuvastatin  (CRESTOR) tablet 10 mg (has no administration in time range)  sulfamethoxazole-trimethoprim (BACTRIM) 400-80 MG per tablet 1 tablet (has no administration in time range)  terazosin (HYTRIN) capsule 2 mg (has no administration in time range)  potassium chloride 10 mEq in 100 mL IVPB (10 mEq Intravenous New Bag/Given 09/18/23 1823)  etomidate (AMIDATE) injection (20 mg Intravenous Given 09/18/23 1550)  succinylcholine (ANECTINE) injection (150 mg Intravenous Given 09/18/23 1551)  EPINEPHrine (EPI-PEN)  injection 0.3 mg (0.3 mg Intramuscular Given 09/18/23 1540)    ED Course/ Medical Decision Making/ A&P                                 Medical Decision Making Amount and/or Complexity of Data Reviewed Labs: ordered. Radiology: ordered.  Risk Prescription drug management. Decision regarding hospitalization.   CT HEAD CODE STROKE WO CONTRAST  Final Result    MR BRAIN WO CONTRAST    (Results Pending)  MR ANGIO HEAD WO CONTRAST    (Results Pending)    NYRON CHMURA is a 47 y.o. male with pertinent PMHX of renal transplant, hypertension, type 2 diabetes, peripheral artery disease status post bilateral BKA's who presents w/ altered mental status and left-sided facial droop, concerning for a stroke. CODE STROKE was initiated on this patient.    The patient was quickly assessed by myself and the attending.  Initial GCS was 3 with minimal to no responsiveness to verbal or physical stimuli.  He was diffusely diaphoretic.  Systolic blood pressure around 60.  Cardiac pads were placed.  Given the report of the burning sensation following IV infusion, concern was for anaphylactic reaction.  Patient received IM epinephrine with mild improvement in mental status.  He began mumbling, albeit nonsensical.  He was also exhibiting spontaneous movements of bilateral upper extremities.  Despite this, patient was not protecting/maintaining their own airway, therefore required emergent intubation for airway  protection.  This was completed with etomidate and succinylcholine.  See procedure note above for details. Blood glucose 201.   The stroke team was emergently consulted. A through neurological exam was performed, and pertinent findings include left-sided facial droop. Labs performed and resulted above. Pertinent lab findings include hemoglobin greater than 20, but no leukocytosis.  No metabolic panel, his potassium is mildly low at 3.2.  His creatinine is 1.96, only mildly increased from his baseline..   CT scanner was made available and the patient was rapidly transported. Head CT full report above, but in summary revealed no acute intracranial bleed.  However, due to difficulty obtaining IV access, full perfusion study was not completed.  Patient was then sent for MRI scans, which did show a possible stroke as well as possible watershed damage.  Additional history able to be obtained by wife.  Wife states that patient has been feeling poorly for the last several days, noting that he was excessively tired.  He also recently finished an antibiotic prescription for UTI 3 days ago.  Neurology was available early in the treatment course of this patient. Antiplatelets were not administered.   Other causes of the neurological findings were concerned, and include infectious causes (encephalopathy, meningitis), tumor/abscess, toxic causes (hypoglycemia, renal failure, hepatic failure, toxic ingestion), other neurologic disorders (seizure, migraine, Todd paralysis, GBS, status epilepticus, BPPV, disc herniation, diabetic neuropathy), psychiatric causes (conversion) and trauma (ICH, spinal injury).   Labs and imaging reviewed by myself and considered in medical decision making if ordered.  Imaging interpreted by radiology.  Patient admitted to ICU in stable condition.   Pt was discussed with my attending, Dr. Wilkie Aye.     Final Clinical Impression(s) / ED Diagnoses Final diagnoses:  Altered mental  status, unspecified altered mental status type    Rx / DC Orders ED Discharge Orders     None         Lyman Speller, MD 09/18/23 2154    Lyman Speller, MD 09/18/23  2232    Shon Baton, MD 09/18/23 2236

## 2023-09-18 NOTE — Progress Notes (Signed)
RT transported pt from TRA-A to 4N26 with RN at bedside. No complications at this time.

## 2023-09-18 NOTE — ED Triage Notes (Signed)
Pt arrives via EMS from infusion center where pt was getting antiviral infusion and became diaphoretic and unresponsive. Pt given benadryl at infusion center. Pt arrives unresponsive, 24G in left hand.  Pt given IM epi then opened eyes and moved left arm. Pt unable to speak, looked aphasic. Pt would move right arm and bilateral amputations.

## 2023-09-18 NOTE — Progress Notes (Signed)
Pt transported from 4N26 to CT and back via ventilator with no complications

## 2023-09-19 ENCOUNTER — Inpatient Hospital Stay (HOSPITAL_COMMUNITY): Payer: 59

## 2023-09-19 DIAGNOSIS — G9341 Metabolic encephalopathy: Secondary | ICD-10-CM | POA: Diagnosis not present

## 2023-09-19 DIAGNOSIS — R579 Shock, unspecified: Secondary | ICD-10-CM

## 2023-09-19 LAB — POCT I-STAT 7, (LYTES, BLD GAS, ICA,H+H)
Acid-base deficit: 11 mmol/L — ABNORMAL HIGH (ref 0.0–2.0)
Acid-base deficit: 11 mmol/L — ABNORMAL HIGH (ref 0.0–2.0)
Acid-base deficit: 6 mmol/L — ABNORMAL HIGH (ref 0.0–2.0)
Bicarbonate: 14.7 mmol/L — ABNORMAL LOW (ref 20.0–28.0)
Bicarbonate: 14.7 mmol/L — ABNORMAL LOW (ref 20.0–28.0)
Bicarbonate: 19.5 mmol/L — ABNORMAL LOW (ref 20.0–28.0)
Calcium, Ion: 1.16 mmol/L (ref 1.15–1.40)
Calcium, Ion: 1.17 mmol/L (ref 1.15–1.40)
Calcium, Ion: 1.14 mmol/L — ABNORMAL LOW (ref 1.15–1.40)
HCT: 51 % (ref 39.0–52.0)
HCT: 49 % (ref 39.0–52.0)
HCT: 47 % (ref 39.0–52.0)
Hemoglobin: 17.3 g/dL — ABNORMAL HIGH (ref 13.0–17.0)
Hemoglobin: 16.7 g/dL (ref 13.0–17.0)
Hemoglobin: 16 g/dL (ref 13.0–17.0)
O2 Saturation: 100 %
O2 Saturation: 99 %
O2 Saturation: 99 %
Patient temperature: 37.5
Patient temperature: 37.2
Potassium: 3.8 mmol/L (ref 3.5–5.1)
Potassium: 3.5 mmol/L (ref 3.5–5.1)
Potassium: 3.2 mmol/L — ABNORMAL LOW (ref 3.5–5.1)
Sodium: 136 mmol/L (ref 135–145)
Sodium: 137 mmol/L (ref 135–145)
Sodium: 136 mmol/L (ref 135–145)
TCO2: 16 mmol/L — ABNORMAL LOW (ref 22–32)
TCO2: 16 mmol/L — ABNORMAL LOW (ref 22–32)
TCO2: 21 mmol/L — ABNORMAL LOW (ref 22–32)
pCO2 arterial: 32.7 mmHg (ref 32–48)
pCO2 arterial: 31.6 mmHg — ABNORMAL LOW (ref 32–48)
pCO2 arterial: 37.3 mmHg (ref 32–48)
pH, Arterial: 7.262 — ABNORMAL LOW (ref 7.35–7.45)
pH, Arterial: 7.278 — ABNORMAL LOW (ref 7.35–7.45)
pH, Arterial: 7.327 — ABNORMAL LOW (ref 7.35–7.45)
pO2, Arterial: 197 mmHg — ABNORMAL HIGH (ref 83–108)
pO2, Arterial: 142 mmHg — ABNORMAL HIGH (ref 83–108)
pO2, Arterial: 146 mmHg — ABNORMAL HIGH (ref 83–108)

## 2023-09-19 LAB — BASIC METABOLIC PANEL
Anion gap: 9 (ref 5–15)
Anion gap: 10 (ref 5–15)
BUN: 25 mg/dL — ABNORMAL HIGH (ref 6–20)
BUN: 23 mg/dL — ABNORMAL HIGH (ref 6–20)
CO2: 14 mmol/L — ABNORMAL LOW (ref 22–32)
CO2: 17 mmol/L — ABNORMAL LOW (ref 22–32)
Calcium: 7.4 mg/dL — ABNORMAL LOW (ref 8.9–10.3)
Calcium: 7.4 mg/dL — ABNORMAL LOW (ref 8.9–10.3)
Chloride: 109 mmol/L (ref 98–111)
Chloride: 105 mmol/L (ref 98–111)
Creatinine, Ser: 2 mg/dL — ABNORMAL HIGH (ref 0.61–1.24)
Creatinine, Ser: 1.99 mg/dL — ABNORMAL HIGH (ref 0.61–1.24)
GFR, Estimated: 40 mL/min — ABNORMAL LOW (ref >60)
GFR, Estimated: 41 mL/min — ABNORMAL LOW (ref >60)
Glucose, Bld: 241 mg/dL — ABNORMAL HIGH (ref 70–99)
Glucose, Bld: 227 mg/dL — ABNORMAL HIGH (ref 70–99)
Potassium: 3.7 mmol/L (ref 3.5–5.1)
Potassium: 3.6 mmol/L (ref 3.5–5.1)
Sodium: 132 mmol/L — ABNORMAL LOW (ref 135–145)
Sodium: 132 mmol/L — ABNORMAL LOW (ref 135–145)

## 2023-09-19 LAB — CBC
HCT: 47.7 % (ref 39.0–52.0)
Hemoglobin: 15.6 g/dL (ref 13.0–17.0)
MCH: 31 pg (ref 26.0–34.0)
MCHC: 32.7 g/dL (ref 30.0–36.0)
MCV: 94.8 fL (ref 80.0–100.0)
Platelets: 248 10*3/uL (ref 150–400)
RBC: 5.03 MIL/uL (ref 4.22–5.81)
RDW: 17.1 % — ABNORMAL HIGH (ref 11.5–15.5)
WBC: 23.5 10*3/uL — ABNORMAL HIGH (ref 4.0–10.5)
nRBC: 0 % (ref 0.0–0.2)

## 2023-09-19 LAB — MAGNESIUM
Magnesium: 1.7 mg/dL (ref 1.7–2.4)
Magnesium: 2.7 mg/dL — ABNORMAL HIGH (ref 1.7–2.4)

## 2023-09-19 LAB — TROPONIN I (HIGH SENSITIVITY)
Troponin I (High Sensitivity): 194 ng/L (ref <18)
Troponin I (High Sensitivity): 197 ng/L (ref <18)
Troponin I (High Sensitivity): 153 ng/L (ref <18)

## 2023-09-19 LAB — GLUCOSE, CAPILLARY
Glucose-Capillary: 107 mg/dL — ABNORMAL HIGH (ref 70–99)
Glucose-Capillary: 119 mg/dL — ABNORMAL HIGH (ref 70–99)
Glucose-Capillary: 120 mg/dL — ABNORMAL HIGH (ref 70–99)
Glucose-Capillary: 121 mg/dL — ABNORMAL HIGH (ref 70–99)
Glucose-Capillary: 203 mg/dL — ABNORMAL HIGH (ref 70–99)
Glucose-Capillary: 219 mg/dL — ABNORMAL HIGH (ref 70–99)

## 2023-09-19 LAB — T4, FREE: Free T4: 1.28 ng/dL — ABNORMAL HIGH (ref 0.61–1.12)

## 2023-09-19 LAB — TRIGLYCERIDES: Triglycerides: 48 mg/dL (ref <150)

## 2023-09-19 LAB — TSH: TSH: 0.978 u[IU]/mL (ref 0.350–4.500)

## 2023-09-19 LAB — PHOSPHORUS
Phosphorus: 2 mg/dL — ABNORMAL LOW (ref 2.5–4.6)
Phosphorus: 2.6 mg/dL (ref 2.5–4.6)

## 2023-09-19 LAB — MRSA NEXT GEN BY PCR, NASAL: MRSA by PCR Next Gen: NOT DETECTED

## 2023-09-19 MED ORDER — MYCOPHENOLATE 200 MG/ML ORAL SUSPENSION
500.0000 mg | Freq: Two times a day (BID) | ORAL | Status: DC
  Administered 2023-09-19: 500 mg
  Filled 2023-09-19 (×4): qty 2.5

## 2023-09-19 MED ORDER — POTASSIUM PHOSPHATES 15 MMOLE/5ML IV SOLN
30.0000 mmol | Freq: Once | INTRAVENOUS | Status: AC
  Administered 2023-09-19: 30 mmol via INTRAVENOUS
  Filled 2023-09-19: qty 10

## 2023-09-19 MED ORDER — ENOXAPARIN SODIUM 40 MG/0.4ML IJ SOSY
40.0000 mg | PREFILLED_SYRINGE | Freq: Every day | INTRAMUSCULAR | Status: DC
  Administered 2023-09-19 – 2023-09-29 (×11): 40 mg via SUBCUTANEOUS
  Filled 2023-09-19 (×11): qty 0.4

## 2023-09-19 MED ORDER — ORAL CARE MOUTH RINSE: 15.0000 mL | OROMUCOSAL | Status: DC | PRN

## 2023-09-19 MED ORDER — ROSUVASTATIN CALCIUM 20 MG PO TABS: 20.0000 mg | ORAL_TABLET | Freq: Every day | ORAL | Status: DC

## 2023-09-19 MED ORDER — INSULIN ASPART 100 UNIT/ML IJ SOLN
0.0000 [IU] | INTRAMUSCULAR | Status: DC
  Administered 2023-09-19 – 2023-09-20 (×5): 2 [IU] via SUBCUTANEOUS
  Administered 2023-09-21: 3 [IU] via SUBCUTANEOUS
  Administered 2023-09-21 – 2023-09-22 (×2): 2 [IU] via SUBCUTANEOUS
  Administered 2023-09-22: 3 [IU] via SUBCUTANEOUS
  Administered 2023-09-22: 2 [IU] via SUBCUTANEOUS
  Administered 2023-09-23: 3 [IU] via SUBCUTANEOUS
  Administered 2023-09-23: 2 [IU] via SUBCUTANEOUS
  Administered 2023-09-23 – 2023-09-24 (×2): 3 [IU] via SUBCUTANEOUS
  Administered 2023-09-24: 8 [IU] via SUBCUTANEOUS
  Administered 2023-09-25: 5 [IU] via SUBCUTANEOUS
  Administered 2023-09-25 – 2023-09-26 (×3): 3 [IU] via SUBCUTANEOUS
  Administered 2023-09-27 (×2): 5 [IU] via SUBCUTANEOUS
  Administered 2023-09-27 – 2023-09-28 (×3): 2 [IU] via SUBCUTANEOUS
  Administered 2023-09-28 – 2023-09-29 (×4): 3 [IU] via SUBCUTANEOUS
  Administered 2023-09-29 (×2): 2 [IU] via SUBCUTANEOUS

## 2023-09-19 MED ORDER — MAGNESIUM SULFATE 4 GM/100ML IV SOLN
4.0000 g | Freq: Once | INTRAVENOUS | Status: AC
  Administered 2023-09-19: 4 g via INTRAVENOUS
  Filled 2023-09-19: qty 100

## 2023-09-19 NOTE — Progress Notes (Signed)
Date and time results received: 09/19/23 0319  Test: Troponin Critical Value: 194  Name of Provider Notified: Dr. Delia Chimes  Orders Received? Or Actions Taken?:   Awaiting New Orders

## 2023-09-19 NOTE — Progress Notes (Signed)
   09/19/23 2100  Pre-Screen- If "YES" to any of the following, STOP the screen, keep NPO, and place order for SLP eval and treat.   Home diet required thickened liquids No - Proceed  Trach tube present No - Proceed  Radiation to Head/Neck No - Proceed  Patient Readiness- If "YES" to any of the following, WAIT to screen, keep NPO, and place order for SLP eval and treat. May rescreen if clinical improvement WITHIN 24h.    Is patient lethargic or unable to stay alert/awake? (!) Yes Keep NPO/Place SLP Eval  3 oz Water Challenge  Document PASS or FAIL (!) FAIL Keep NPO/Place SLP Eval Order

## 2023-09-19 NOTE — Plan of Care (Signed)
  Problem: Metabolic: Goal: Ability to maintain appropriate glucose levels will improve Outcome: Not Progressing   Problem: Nutritional: Goal: Maintenance of adequate nutrition will improve Outcome: Not Progressing

## 2023-09-19 NOTE — Progress Notes (Signed)
NAME:  [name], MRN:  [id number], DOB:  10-19-1976, LOS: 1 ADMISSION DATE:  09/18/2023, CONSULTATION DATE: 09/18/2023 REFERRING MD: EDP, CHIEF COMPLAINT: Altered mental status  History of Present Illness:  This is a 47 year old male with a past medical history of hypertension, type 2 diabetes, PAD status post 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  Patient was at his infusion center today getting his Belatacept infusion for chronic immunosuppressive therapy and during his infusion he became hypotensive and had full body burning sensation.  Patient did receive Benadryl at the infusion center and patient was transported to the [facility] for further evaluation and management.  On initial arrival to the emergency department patient was altered, and had blood pressure of 99/86.  Patient was altered, and minimally responsive.  Patient was given IM epinephrine with mild improvement in mental status.  Patient was mumbling and nonsensical, and therefore with concern for not protecting his own airway, patient was emergently intubated.  Patient subsequently had code stroke called.  Patient was admitted to the ICU for further evaluation management of altered mental status.  Pertinent  Medical History  Type 2 diabetes mellitus, hypertension, status post bilateral BKA's, status post renal and pancreatic transplant on chronic immunosuppressive therapy  Significant Hospital Events: Including procedures, antibiotic start and stop dates in addition to other pertinent events   09/18/2023: Intubated for altered mental status 9/23 MRI/MRA brain>> 1. Scattered small foci of acute subcortical infarction in the left occipital lobe and posterior left frontal lobe. 2. Faint diffusion signal abnormality along the left parietal lobe may reflect  evolving infarct versus seizure related cytotoxic edema. 3. 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 9/24 2D echo>>>  Interim History / Subjective:  Patient was intubated and sedated upon my exam.  Unable to obtain history secondary to intubation and sedation.  Objective   Blood pressure 134/79, pulse 77, temperature 99.1 F (37.3 C), resp. rate (!) 21, weight 86.5 kg, SpO2 100%.    Vent Mode: PRVC FiO2 (%):  [30 %-100 %] 30 % Set Rate:  [18 bmp] 18 bmp Vt Set:  [580 mL] 580 mL PEEP:  [5 cmH20] 5 cmH20 Plateau Pressure:  [15 cmH20-16 cmH20] 15 cmH20   Intake/Output Summary (Last 24 hours) at 09/19/2023 1010 Last data filed at 09/19/2023 4098 Gross per 24 hour  Intake 2356.71 ml  Output 850 ml  Net 1506.71 ml   Filed Weights   09/19/23 0500  Weight: 86.5 kg    Examination: General:   wdwn male, NAD on vent  HEENT: MM pink/moist, ETT, no obvious facial, lip, tongue swelling  Neuro: sedated on vent, RASS -1, follows commands L side  CV: s1s2 rrr, no m/r/g PULM:  resps even non labored on vent, tol PS briefly with good Vt but too sleepy  GI: soft, bsx4 active  Extremities: Bilat BKA, warm/dry, no edema edema  Skin: no rashes or lesions  Assessment & Plan:  AMS  Acute ischemic infarcts - possible watershed secondary to severe sudden hypotension in setting ?anaphylaxis  P:  Stroke team following  Echo pending  BP mgmt  Statin, ASA   Acute respiratory failure - r/t inadequate airway protection in setting anaphylactic reaction and stroke  P:  Vent support - 8cc/kg  F/u CXR  F/u ABG Wean sedation  WUA and SBT  Ensure cuff leak, hope can extubate today  Concern for anaphylactic reaction - Patient was getting his Belatacept infusion and became acutely altered and hypotensive.  Given Benadryl for potential allergic reaction and was sent to [facility] emergency department.  Patient was given epinephrine here, and patient was subsequently  intubated for altered mental status and inability to protect airway. No mention of stridor noted, intubation not difficult.  P:  Monitor  Tryptase pending  Holding further steroids for now in setting immunosuppression and chronic prednisone Check Cuff leak prior to extubation   Erythrocytosis - suspect r/t hemoconcentration, improved.  P: -Monitor CBC -Hyperviscosity may have contributed to neurological event.  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  Baseline Scr ~1.6-1.8 -Follow-up BMP in a.m. -continue home prednisone 5 mg daily -continue home mycophenolate 360 mg twice daily  #PAD status post bilateral BKA Patient has a past medical history of PAD.  Patient status post bilateral BKA.   P:  -cont home Crestor 10 mg daily  Abnormal TSH - no known hx thyroid disease. TSH 1.04 06/2023 now 7.941 P:  Repeat TSH, add free T4  Best Practice (right click and "Reselect all SmartList Selections" daily)   Diet/type: NPO w/ oral meds DVT prophylaxis: LMWH GI prophylaxis: PPI Lines: N/A Foley:  N/A Code Status:  full code  CRITICAL CARE Performed by: Danford Bad   Total critical care time: 35 minutes  Critical care time was exclusive of separately billable procedures and treating other patients.  Critical care was necessary to treat or prevent imminent or life-threatening deterioration.  Critical care was time spent personally by me on the following activities: development of treatment plan with patient and/or surrogate as well as nursing, discussions with consultants, evaluation of patient's response to treatment, examination of patient, obtaining history from patient or surrogate, ordering and performing treatments and interventions,  ordering and review of laboratory studies, ordering and review of radiographic studies, pulse oximetry, re-evaluation of patient's condition and participation in multidisciplinary rounds.   Dirk Dress, NP Pulmonary/Critical Care Medicine  09/19/2023  10:10 AM   See Loretha Stapler for personal pager PCCM on call pager [phone] until 7pm. Please call Elink 7p-7a. [phone]

## 2023-09-19 NOTE — Progress Notes (Addendum)
STROKE TEAM PROGRESS NOTE   BRIEF HPI [name] is a 47 y.o. 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  Patient was given IM epinephrine on ED arrival with improvement in neurologic status as patient was able to open his eyes and move all of his extremities but remained unable to speak.  Due to observed facial droop, a code stroke was activated.  Presentation felt to be concerning for possible vasodilatory shock/anaphylactic shock due to allergic reaction versus anaphylaxis to immunosuppressive infusion.  Patient also recently admitted 9/9 with findings of an E. coli UTI and poorly controlled hypertension with discharge on 09/06/2023.  SIGNIFICANT HOSPITAL EVENTS MRI brain with scattered small foci of acute subcortical infarction in the left occipital lobe and posterior left frontal lobe.  Faint diffusion signal abnormality along the left parietal lobe may reflect evolving infarct versus seizure related cytotoxic edema.  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  INTERIM HISTORY/SUBJECTIVE Patient remains intubated and sedated in the ICU.  Patient requiring fentanyl, levo for blood pressure stabilization, bicarb drips. With arousal, patient moves bilateral lower extremities to command, left arm is purposeful with antigravity movement, we can movement on the right compared to the left and weaker right upper extremity than right leg.  Patient follows commands, nods appropriately, fixates, and tracks.  OBJECTIVE  CBC    Component Value Date/Time   WBC 23.5 (H) 09/19/2023 0203   RBC 5.03 09/19/2023 0203   HGB 16.7 09/19/2023 0343   HCT 49.0 09/19/2023 0343   PLT 248  09/19/2023 0203   MCV 94.8 09/19/2023 0203   MCH 31.0 09/19/2023 0203   MCHC 32.7 09/19/2023 0203   RDW 17.1 (H) 09/19/2023 0203   LYMPHSABS 2.9 09/18/2023 1358   MONOABS 0.3 09/18/2023 1358   EOSABS 0.1 09/18/2023 1358   BASOSABS 0.0 09/18/2023 1358   BMET    Component Value Date/Time   NA 132 (L) 09/19/2023 0633   NA 139 09/26/2021 0000   K 3.6 09/19/2023 0633   CL 105 09/19/2023 0633   CO2 17 (L) 09/19/2023 0633   GLUCOSE 227 (H) 09/19/2023 0633   BUN 23 (H) 09/19/2023 0633   BUN 21 09/26/2021 0000   CREATININE 1.99 (H) 09/19/2023 0633   CREATININE 1.20 05/30/2014 1415   CALCIUM 7.4 (L) 09/19/2023 0633   CALCIUM 8.4 09/08/2011 0746   EGFR 42.0 09/12/2023 0000   EGFR 42 09/12/2023 0000   GFRNONAA 41 (L) 09/19/2023 0633   IMAGING past 24 hours CT HEAD WO CONTRAST ( )  Result Date: 09/19/2023 CLINICAL DATA:  Initial evaluation for neuro deficit, stroke suspected, change in mental status. EXAM: CT HEAD WITHOUT CONTRAST TECHNIQUE: Contiguous axial images were obtained from the base of the skull through the vertex without intravenous contrast. RADIATION DOSE REDUCTION: This exam was performed according to the departmental dose-optimization program which includes automated exposure control, adjustment of the mA and/or kV according to patient size and/or use of iterative reconstruction technique. COMPARISON:  Prior CT and MRI from earlier the same day. FINDINGS: Brain: Cerebral volume within normal limits. Previously identified ischemic infarcts involving the left cerebral hemisphere not well visualized by CT. No other acute large vessel territory infarct. No acute intracranial hemorrhage. No mass lesion or midline shift. No hydrocephalus  or extra-axial fluid collection. Vascular: No abnormal asymmetric hyperdense vessel. Skull: Scalp soft tissues and calvarium demonstrate no new finding. Sinuses/Orbits: Globes and orbital soft tissues within normal limits. Mild scattered mucosal  thickening about the ethmoidal air cells and maxillary sinuses. No significant mastoid effusion. Other: None. IMPRESSION: 1. No acute intracranial abnormality. 2. Previously identified small volume left cerebral infarcts not well visualized by CT. Electronically Signed   By: Rise Mu M.D.   On: 09/19/2023 06:19   DG Abd 1 View  Result Date: 09/19/2023 CLINICAL DATA:  [id number] Encounter for imaging study to confirm orogastric (OG) tube placement [id number] EXAM: ABDOMEN - 1 VIEW COMPARISON:  CT abdomen pelvis 10/25/2017 FINDINGS: Enteric tube courses below the hemidiaphragm with tip and side port overlying the expected region of the gastric lumen. The bowel gas pattern is normal. No radio-opaque calculi or other significant radiographic abnormality are seen. IMPRESSION: Enteric tube in good position. Electronically Signed   By: Tish Frederickson M.D.   On: 09/19/2023 03:32   MR BRAIN WO CONTRAST  Result Date: 09/18/2023 CLINICAL DATA:  Neuro deficit, acute, stroke suspected. Unresponsive. EXAM: MRI HEAD WITHOUT CONTRAST MRA HEAD WITHOUT CONTRAST TECHNIQUE: Multiplanar, multi-echo pulse sequences of the brain and surrounding structures were acquired without intravenous contrast. Angiographic images of the Circle of Willis were acquired using MRA technique without intravenous contrast. COMPARISON:  Head CT 09/18/2023. FINDINGS: MRI HEAD FINDINGS Brain: Scattered small foci of acute subcortical infarction in the left occipital lobe and posterior left frontal lobe. Faint diffusion signal abnormality along the left parietal lobe (axial image 91 series 5) may reflect evolving infarct versus seizure related cytotoxic edema. No acute hemorrhage or significant mass effect. Background of mild chronic small-vessel disease. Vascular: See below. Skull and upper cervical spine: Normal marrow signal. Sinuses/Orbits: No acute findings. Other: None. MRA HEAD FINDINGS Anterior circulation: Occluded left ICA with  reconstitution of the communicating segment. The proximal ACAs and MCAs are patent without stenosis or aneurysm. Distal branches are symmetric. Posterior circulation: Visualized portions of the distal vertebral arteries and basilar artery are patent without stenosis or aneurysm. The SCAs, AICAs and PICAs are patent proximally. The PCAs are patent proximally without stenosis or aneurysm. Distal branches are symmetric. Anatomic variants: None. IMPRESSION: 1. Scattered small foci of acute subcortical infarction in the left occipital lobe and posterior left frontal lobe. 2. Faint diffusion signal abnormality along the left parietal lobe may reflect evolving infarct versus seizure related cytotoxic edema. 3. 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 Code stroke imaging results were communicated on 09/18/2023 at 7:24 pm to provider Dr. Derry Lory via secure text paging. Electronically Signed   By: Orvan Falconer M.D.   On: 09/18/2023 19:27   MR ANGIO HEAD WO CONTRAST  Result Date: 09/18/2023 CLINICAL DATA:  Neuro deficit, acute, stroke suspected. Unresponsive. EXAM: MRI HEAD WITHOUT CONTRAST MRA HEAD WITHOUT CONTRAST TECHNIQUE: Multiplanar, multi-echo pulse sequences of the brain and surrounding structures were acquired without intravenous contrast. Angiographic images of the Circle of Willis were acquired using MRA technique without intravenous contrast. COMPARISON:  Head CT 09/18/2023. FINDINGS: MRI HEAD FINDINGS Brain: Scattered small foci of acute subcortical infarction in the left occipital lobe and posterior left frontal lobe. Faint diffusion signal abnormality along the left parietal lobe (axial image 91 series 5) may reflect evolving infarct versus seizure related cytotoxic edema. No acute hemorrhage or significant mass effect. Background of mild chronic small-vessel disease. Vascular: See below. Skull and upper cervical spine: Normal marrow signal. Sinuses/Orbits:  No acute findings.  Other: None. 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 The proximal ACAs and MCAs are patent without stenosis or aneurysm. Distal branches are symmetric. Posterior circulation: Visualized portions of the distal vertebral arteries and basilar artery are patent without stenosis or aneurysm. The SCAs, AICAs and PICAs are patent proximally. The PCAs are patent proximally without stenosis or aneurysm. Distal branches are symmetric. Anatomic variants: None. IMPRESSION: 1. Scattered small foci of acute subcortical infarction in the left occipital lobe and posterior left frontal lobe. 2. Faint diffusion signal abnormality along the left parietal lobe may reflect evolving infarct versus seizure related cytotoxic edema. 3. 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 Code stroke imaging results were communicated on 09/18/2023 at 7:24 pm to provider Dr. Derry Lory via secure text paging. Electronically Signed   By: Orvan Falconer M.D.   On: 09/18/2023 19:27   CT HEAD CODE STROKE WO CONTRAST  Result Date: 09/18/2023 CLINICAL DATA:  Code stroke. Neuro deficit, acute, stroke suspected. Unresponsive. EXAM: CT HEAD WITHOUT CONTRAST TECHNIQUE: Contiguous axial images were obtained from the base of the skull through the vertex without intravenous contrast. RADIATION DOSE REDUCTION: This exam was performed according to the departmental dose-optimization program which includes automated exposure control, adjustment of the mA and/or kV according to patient size and/or use of iterative reconstruction technique. COMPARISON:  Head CT 09/04/2023. FINDINGS: Brain: No acute intracranial hemorrhage. Stable mild chronic small-vessel disease. Cortical gray-white differentiation is preserved. No hydrocephalus or extra-axial collection. No mass effect or midline shift. Vascular: No hyperdense vessel or unexpected calcification. Skull: No calvarial fracture  or suspicious bone lesion. Skull base is unremarkable. Sinuses/Orbits: No acute finding. Other: None. ASPECTS [facility] Stroke Program Early CT Score) - Ganglionic level infarction (caudate, lentiform nuclei, internal capsule, insula, M1-M3 cortex): 7 - Supraganglionic infarction (M4-M6 cortex): 3 Total score (0-10 with 10 being normal): 10 IMPRESSION: 1. No acute intracranial hemorrhage or evidence of acute large vessel territory infarct. ASPECT score is 10. 2. Stable mild chronic small-vessel disease. Code stroke imaging results were communicated on 09/18/2023 at 4:26 pm to provider Dr. Selina Cooley via secure text paging. Electronically Signed   By: Orvan Falconer M.D.   On: 09/18/2023 16:27    Vitals:   09/19/23 0600 09/19/23 0615 09/19/23 0630 09/19/23 0645  BP:      Pulse: 83 79 79 79  Resp: 18 18 18 18   Temp:  98.8 F (37.1 C) 98.8 F (37.1 C) 99 F (37.2 C)  TempSrc:      SpO2: 100% 100% 100% 100%  Weight:       PHYSICAL EXAM General: Critically ill appearing African-American male intubated and on sedation in the ICU Psych: Unable to assess due to patient condition, critically ill and on sedation medication CV: Regular rate and rhythm on monitor Respiratory: Respirations assisted via mechanical ventilation, spontaneous respirations over set vent rate present GI: Abdomen soft and nontender  NEURO:  Mental Status: Patient is drowsy due to sedation, opens eyes to voice.  Patient nods appropriately to examiner questions.  Patient follows commands. Speech/Language: Unable to assess due to ETT placement with mechanical ventilation  Cranial Nerves:  II: PERRL, small pupils III, IV, VI: Patient fixates and tracks examiner throughout V: Corneals intact VII: Facial symmetry obscured due to ETT VIII: Hearing is intact to voice. IX, X: Cough and gag reflexes are intact XI: Head is grossly midline XII: Does not protrude tongue Motor: Right upper extremity  with minimal movement, left upper  extremity is purposeful with antigravity movement to command.  Left lower extremity is purposeful, right lower extremity withdrawals with light application of noxious stimuli.  Right lower extremity is slightly weaker than left.  Bilateral BKA's noted. Tone: is normal and bulk is normal Sensation: Reacts to touch throughout. Coordination: Does not perform Gait: Deferred in acute setting  ASSESSMENT/PLAN  Acute Ischemic Infarct: Watershed appearing scattered small foci of acute subcortical infarction in the left occipital lobe and posterior left frontal lobe Etiology:  watershed in the setting of severe hypotensive event / vasodilatory versus anaphylactic shock with occluded left ICA with reconstitution of the communicating segment CT head no acute intracranial hemorrhage or evidence of acute large vessel territory infarct.  Aspects score is 10.  Stable mild chronic small vessel disease. Repeat CTH: No acute intracranial abnormality.  Previously identified small volume left cerebral infarct not well-visualized by CT. MRI brain/MRA head: scattered small foci of acute subcortical infarction in the left occipital lobe and posterior left frontal lobe.  Faint diffusion signal abnormality along the left parietal lobe may reflect evolving infarct versus seizure related to cytotoxic edema.  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 Carotid Doppler US ordered, pending  2D Echo ordered, pending  LDL 90 HgbA1c 5.8 VTE prophylaxis - SQ Lovenox No antithrombotic prior to admission, now on aspirin 81 mg daily and clopidogrel 75 mg daily for 3 weeks and then aspirin alone. Therapy recommendations:  pending Disposition:  pending  Essential Hypertension Home meds: Carvedilol, hydralazine Unstable Blood Pressure Goal: BP less than 220/110  Avoid hypotension   Hypotension during admission Concern for Vasodilatory/anaphylactic shock Presenting systolic blood pressure in the  60s Patient is requiring levo gtt for blood pressure support  Hyperlipidemia Home meds: Rosuvastatin 10 mg, resumed in hospital LDL 90, goal < 70 Increase rosuvastatin to 20 mg PO daily Continue statin at discharge  Remote tobacco abuse Stopped smoking cigars over one year ago  Remote Substance Abuse Previously reported marijuana use UDS negative on admission   Other Stroke Risk Factors Coronary artery disease  Other Active Problems PAD s/p bilateral BKA AKI on CKD stage IIIa Chronic immunosuppressive therapy due to renal and pancreatic transplant Shock: Concern for vasodilatory/anaphylactic shock Acidosis improving on bicarb drip Continues to require levo for blood pressure support Recent admission for E. coli UTI UA on admission negative  Hospital day # 1  Lanae Boast, AGACNP-BC [facility] Pager: [phone] I have personally obtained history,examined this patient, reviewed notes, independently viewed imaging studies, participated in medical decision making and plan of care.ROS completed by me personally and pertinent positives fully documented  I have made any additions or clarifications directly to the above note. Agree with note above.  Patient presented with shock with hypotension and MRI scan shows small watershed left ACA's/MCA and MCA/PCA infarcts in the setting of left ICA occlusion..  Neurological exam is limited due to sedation and intubation but patient clearly has mild right hemiparesis arm greater than leg.  Recommend continue ventilatory support and wean off sedation and respiratory support as per critical care team.  Maintain permissive hypertension with systolic in the 120-140 range at least.  No family available at the bedside for discussion.This patient is critically ill and at significant risk of neurological worsening, death and care requires constant monitoring of vital signs, hemodynamics,respiratory and cardiac monitoring, extensive review  of multiple databases, frequent neurological assessment, discussion with family, other specialists and medical decision making of high  complexity.I have made any additions or clarifications directly to the above note.This critical care time does not reflect procedure time, or teaching time or supervisory time of PA/NP/Med Resident etc but could involve care discussion time.  I spent 30 minutes of neurocritical care time  in the care of  this patient.      Delia Heady, MD Medical Director [facility] St[facility] Center Pager: [phone] 09/19/2023 5:45 PM  To contact Stroke Continuity provider, please refer to [email or web address]. After hours, contact General Neurology

## 2023-09-19 NOTE — Procedures (Signed)
Extubation Procedure Note  Patient Details:   Name: [name] DOB: 10/10/1976 MRN: [id number]   Airway Documentation:    Vent end date: 09/19/23 Vent end time: 1434   Evaluation  O2 sats: stable throughout Complications: No apparent complications Patient did tolerate procedure well. Bilateral Breath Sounds: Clear, Diminished   Yes  Patient extubated per MD order. Positive cuff leak noted. No stridor. Vitals are stable on 2L [place]. RN at bedside.  Harmon Dun Hadeel Hillebrand 09/19/2023, 2:35 PM

## 2023-09-20 ENCOUNTER — Ambulatory Visit (HOSPITAL_COMMUNITY): Payer: 59

## 2023-09-20 ENCOUNTER — Other Ambulatory Visit: Payer: Self-pay

## 2023-09-20 ENCOUNTER — Inpatient Hospital Stay (HOSPITAL_COMMUNITY): Payer: 59

## 2023-09-20 DIAGNOSIS — I639 Cerebral infarction, unspecified: Secondary | ICD-10-CM

## 2023-09-20 DIAGNOSIS — T782XXA Anaphylactic shock, unspecified, initial encounter: Secondary | ICD-10-CM

## 2023-09-20 DIAGNOSIS — G9341 Metabolic encephalopathy: Secondary | ICD-10-CM | POA: Diagnosis not present

## 2023-09-20 DIAGNOSIS — R4182 Altered mental status, unspecified: Secondary | ICD-10-CM

## 2023-09-20 LAB — CBC
HCT: 41.2 % (ref 39.0–52.0)
Hemoglobin: 13.3 g/dL (ref 13.0–17.0)
MCH: 30.5 pg (ref 26.0–34.0)
MCHC: 32.3 g/dL (ref 30.0–36.0)
MCV: 94.5 fL (ref 80.0–100.0)
Platelets: 228 10*3/uL (ref 150–400)
RBC: 4.36 MIL/uL (ref 4.22–5.81)
RDW: 16.9 % — ABNORMAL HIGH (ref 11.5–15.5)
WBC: 14.2 10*3/uL — ABNORMAL HIGH (ref 4.0–10.5)
nRBC: 0 % (ref 0.0–0.2)

## 2023-09-20 LAB — CULTURE, URINE COMPREHENSIVE: RESULT:: NO GROWTH

## 2023-09-20 LAB — BASIC METABOLIC PANEL
Anion gap: 8 (ref 5–15)
BUN: 16 mg/dL (ref 6–20)
CO2: 25 mmol/L (ref 22–32)
Calcium: 7.6 mg/dL — ABNORMAL LOW (ref 8.9–10.3)
Chloride: 103 mmol/L (ref 98–111)
Creatinine, Ser: 1.66 mg/dL — ABNORMAL HIGH (ref 0.61–1.24)
GFR, Estimated: 51 mL/min — ABNORMAL LOW (ref >60)
Glucose, Bld: 118 mg/dL — ABNORMAL HIGH (ref 70–99)
Potassium: 3.1 mmol/L — ABNORMAL LOW (ref 3.5–5.1)
Sodium: 136 mmol/L (ref 135–145)

## 2023-09-20 LAB — GLUCOSE, CAPILLARY
Glucose-Capillary: 142 mg/dL — ABNORMAL HIGH (ref 70–99)
Glucose-Capillary: 125 mg/dL — ABNORMAL HIGH (ref 70–99)
Glucose-Capillary: 111 mg/dL — ABNORMAL HIGH (ref 70–99)
Glucose-Capillary: 140 mg/dL — ABNORMAL HIGH (ref 70–99)
Glucose-Capillary: 140 mg/dL — ABNORMAL HIGH (ref 70–99)

## 2023-09-20 LAB — TRYPTASE: Tryptase: 64.2 ug/L — ABNORMAL HIGH (ref 2.2–13.2)

## 2023-09-20 MED ORDER — CARVEDILOL 6.25 MG PO TABS
6.2500 mg | ORAL_TABLET | Freq: Two times a day (BID) | ORAL | Status: DC
  Administered 2023-09-20 – 2023-09-27 (×14): 6.25 mg via ORAL
  Filled 2023-09-20 (×9): qty 1
  Filled 2023-09-20: qty 2
  Filled 2023-09-20 (×4): qty 1

## 2023-09-20 MED ORDER — HYDRALAZINE HCL 50 MG PO TABS: 50.0000 mg | ORAL_TABLET | Freq: Four times a day (QID) | ORAL | Status: DC

## 2023-09-20 MED ORDER — ROSUVASTATIN CALCIUM 20 MG PO TABS
20.0000 mg | ORAL_TABLET | Freq: Every day | ORAL | Status: DC
  Administered 2023-09-20 – 2023-09-29 (×10): 20 mg via ORAL
  Filled 2023-09-20 (×10): qty 1

## 2023-09-20 MED ORDER — HYDRALAZINE HCL 50 MG PO TABS
50.0000 mg | ORAL_TABLET | Freq: Two times a day (BID) | ORAL | Status: DC
  Administered 2023-09-20 – 2023-09-27 (×14): 50 mg via ORAL
  Filled 2023-09-20 (×15): qty 1

## 2023-09-20 MED ORDER — PANTOPRAZOLE SODIUM 40 MG PO TBEC
40.0000 mg | DELAYED_RELEASE_TABLET | Freq: Every day | ORAL | Status: AC
Start: 2023-09-20 — End: ?
  Administered 2023-09-20 – 2023-09-23 (×4): 40 mg via ORAL
  Filled 2023-09-20 (×4): qty 1

## 2023-09-20 MED ORDER — PREDNISONE 5 MG PO TABS
5.0000 mg | ORAL_TABLET | Freq: Every day | ORAL | Status: DC
  Administered 2023-09-20 – 2023-09-29 (×10): 5 mg via ORAL
  Filled 2023-09-20 (×10): qty 1

## 2023-09-20 MED ORDER — MYCOPHENOLATE SODIUM 180 MG PO TBEC
360.0000 mg | DELAYED_RELEASE_TABLET | Freq: Two times a day (BID) | ORAL | Status: DC
  Administered 2023-09-20 – 2023-09-25 (×10): 360 mg via ORAL
  Filled 2023-09-20 (×13): qty 2

## 2023-09-20 MED ORDER — MYCOPHENOLATE 200 MG/ML ORAL SUSPENSION
500.0000 mg | Freq: Two times a day (BID) | ORAL | Status: DC
  Administered 2023-09-20: 500 mg via ORAL
  Filled 2023-09-20 (×2): qty 2.5

## 2023-09-20 MED ORDER — SULFAMETHOXAZOLE-TRIMETHOPRIM 400-80 MG PO TABS
1.0000 | ORAL_TABLET | ORAL | Status: DC
  Administered 2023-09-20 – 2023-09-29 (×5): 1 via ORAL
  Filled 2023-09-20 (×5): qty 1

## 2023-09-20 MED ORDER — POTASSIUM CHLORIDE 10 MEQ/100ML IV SOLN
10.0000 meq | INTRAVENOUS | Status: AC
  Administered 2023-09-20 (×6): 10 meq via INTRAVENOUS
  Filled 2023-09-20 (×6): qty 100

## 2023-09-20 NOTE — Progress Notes (Signed)
NAME:  [name], MRN:  [id number], DOB:  September 27, 1976, LOS: 2 ADMISSION DATE:  09/18/2023, CONSULTATION DATE: 09/18/2023 REFERRING MD: EDP, CHIEF COMPLAINT: Altered mental status  History of Present Illness:  This is a 47 year old male with a past medical history of hypertension, type 2 diabetes, PAD status post 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  Patient was at his infusion center today getting his Belatacept infusion for chronic immunosuppressive therapy and during his infusion he became hypotensive and had full body burning sensation.  Patient did receive Benadryl at the infusion center and patient was transported to the [facility] for further evaluation and management.  On initial arrival to the emergency department patient was altered, and had blood pressure of 99/86.  Patient was altered, and minimally responsive.  Patient was given IM epinephrine with mild improvement in mental status.  Patient was mumbling and nonsensical, and therefore with concern for not protecting his own airway, patient was emergently intubated.  Patient subsequently had code stroke called.  Patient was admitted to the ICU for further evaluation management of altered mental status.  Pertinent  Medical History  Type 2 diabetes mellitus, hypertension, status post bilateral BKA's, status post renal and pancreatic transplant on chronic immunosuppressive therapy  Significant Hospital Events: Including procedures, antibiotic start and stop dates in addition to other pertinent events   09/18/2023: Intubated for altered mental status 9/23 MRI/MRA brain>> 1. Scattered small foci of acute subcortical infarction in the left occipital lobe and posterior left frontal lobe. 2. Faint diffusion signal abnormality along the left parietal lobe may reflect  evolving infarct versus seizure related cytotoxic edema. 3. 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 9/24 2D echo >>  Interim History / Subjective:  Patient was intubated and sedated upon my exam.  Unable to obtain history secondary to intubation and sedation.  Objective   Blood pressure (!) 174/79, pulse 74, temperature 99 F (37.2 C), resp. rate 17, weight 83.9 kg, SpO2 100%.    Vent Mode: PRVC FiO2 (%):  [30 %] 30 % Set Rate:  [18 bmp] 18 bmp Vt Set:  [580 mL] 580 mL PEEP:  [5 cmH20] 5 cmH20 Plateau Pressure:  [16 cmH20] 16 cmH20   Intake/Output Summary (Last 24 hours) at 09/20/2023 4696 Last data filed at 09/20/2023 0500 Gross per 24 hour  Intake 2818.16 ml  Output 1650 ml  Net 1168.16 ml   Filed Weights   09/19/23 0500 09/20/23 0500  Weight: 86.5 kg 83.9 kg    Examination: General:   Middle aged male resting comfortably in bed HEENT: [place]/AT, no JVD, pupils 2mm and sluggish.  Neuro: Somnolent. Arouses briefly to voice. Follows commands on the L upper.  CV: RRR, no MRG PULM:  No distress. Clear breath sounds.  GI: Soft, NT, ND Extremities: Bilateral BKA  Skin: no rashes or lesions  Assessment & Plan:  AMS  Acute ischemic infarcts - watershed distribution likely secondary to severe sudden hypotension in setting ?anaphylaxis  P:  Stroke team following  Echo pending  BP goals per stroke Statin, ASA   Acute respiratory failure - r/t inadequate airway protection in setting anaphylactic reaction and stroke  P:  Tolerated extubation well Remains an aspiration risk SLP eval today Suspect will need cortrak and TF.   Concern for anaphylactic reaction - Patient was getting his Belatacept infusion and became acutely altered and  hypotensive.  Given Benadryl for potential allergic reaction and was sent to [facility] emergency department.  Patient was given epinephrine here, and patient was subsequently intubated for altered mental status and  inability to protect airway. No mention of stridor noted, intubation not difficult.  P:  Monitor  Tryptase level elevated 64.2 > will repeat to make sure it clears to confirm acute anaphylaxis.  Holding further steroids for now in setting immunosuppression and chronic prednisone  Erythrocytosis - suspect r/t hemoconcentration, improved.  P: -Monitor CBC -Hyperviscosity may have contributed to neurological event.  Hyponatremia Hypokalemia Hypophosphatemia  P:  Replete K  F/u chem   NonAG Metabolic acidosis  -Follow-up ABG -f/u chem  -dc bicarb with serum level 25  #AKI on CKD II-IIIa #History of renal transplant Could have had transient decrease in kidney function with hypotension.  Baseline Scr ~1.6-1.8 -Follow-up BMP in a.m. -continue home prednisone 5 mg daily -continue home mycophenolate 360 mg twice daily -Requesting cortrak to ensure admin of anti-rejection meds.   #PAD status post bilateral BKA Patient has a past medical history of PAD.  Patient status post bilateral BKA.   P:  -cont home Crestor 10 mg daily  Abnormal TSH - no known hx thyroid disease.  TSH 1.04 06/2023 now 7.941 on 9/23 > 9/24 0.97 T4 1.28 P:  Monitor > outpatient follow up.   Can likely transfer out of ICU today.   Best Practice (right click and "Reselect all SmartList Selections" daily)   Diet/type: NPO w/ oral meds DVT prophylaxis: LMWH GI prophylaxis: PPI Lines: N/A Foley:  N/A Code Status:  full code   Joneen Roach, AGACNP-BC [facility]  See Amion for personal pager PCCM on call pager [phone] until 7pm. Please call Elink 7p-7a. [phone]  09/20/2023 8:45 AM

## 2023-09-20 NOTE — Progress Notes (Signed)
STROKE TEAM PROGRESS NOTE   BRIEF HPI Mr. MELVERN FAUBION is a 47 y.o. 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  Patient was given IM epinephrine on ED arrival with improvement in neurologic status as patient was able to open his eyes and move all of his extremities but remained unable to speak.  Due to observed facial droop, a code stroke was activated.  Presentation felt to be concerning for possible vasodilatory shock/anaphylactic shock due to allergic reaction versus anaphylaxis to immunosuppressive infusion.  Patient also recently admitted 9/9 with findings of an E. coli UTI and poorly controlled hypertension with discharge on 09/06/2023.  SIGNIFICANT HOSPITAL EVENTS MRI brain with scattered small foci of acute subcortical infarction in the left occipital lobe and posterior left frontal lobe.  Faint diffusion signal abnormality along the left parietal lobe may reflect evolving infarct versus seizure related cytotoxic edema.  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  INTERIM HISTORY/SUBJECTIVE Patient is extubated today.  He continues to have right hemiparesis with significant right arm weakness and mild right leg weakness.  He is speech is nonfluent but able to speak sentences and follows commands well.  Carotid ultrasound is pending.  OBJECTIVE  CBC    Component Value Date/Time   WBC 14.2 (H) 09/20/2023 0455   RBC 4.36 09/20/2023 0455   HGB 13.3 09/20/2023 0455   HCT 41.2 09/20/2023 0455   PLT 228 09/20/2023 0455   MCV 94.5 09/20/2023 0455   MCH 30.5 09/20/2023 0455   MCHC 32.3 09/20/2023 0455   RDW 16.9 (H) 09/20/2023 0455   LYMPHSABS 2.9 09/18/2023 1358    MONOABS 0.3 09/18/2023 1358   EOSABS 0.1 09/18/2023 1358   BASOSABS 0.0 09/18/2023 1358   BMET    Component Value Date/Time   NA 136 09/20/2023 0455   NA 139 09/26/2021 0000   K 3.1 (L) 09/20/2023 0455   CL 103 09/20/2023 0455   CO2 25 09/20/2023 0455   GLUCOSE 118 (H) 09/20/2023 0455   BUN 16 09/20/2023 0455   BUN 21 09/26/2021 0000   CREATININE 1.66 (H) 09/20/2023 0455   CREATININE 1.20 05/30/2014 1415   CALCIUM 7.6 (L) 09/20/2023 0455   CALCIUM 8.4 09/08/2011 0746   EGFR 42.0 09/12/2023 0000   EGFR 42 09/12/2023 0000   GFRNONAA 51 (L) 09/20/2023 0455   IMAGING past 24 hours DG Swallowing Func-Speech Pathology  Result Date: 09/20/2023 Table formatting from the original result was not included. Modified Barium Swallow Study Patient Details Name: NITIN HOLVERSON MRN: [id number] Date of Birth: Aug 03, 1976 Today's Date: 09/20/2023 HPI/PMH: HPI: ALLISON CORUM is a 47 yo male presenting to ED 9/23 with AMS. Pt found to be aphasic with a facial droop and a code stroke was activated. Intubated 9/23-9/24. MRI Brain with scattered small foci of acute subcortical infarction in L occipital lobe and posterior L frontal lobe and occluded L ICA. Recently admitted 9/9-9/11 with findings of E coli UTI and poorly controlled HTN. 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 With thin liquids in particular, the head of the bolus progresses to the level of the arytenoids prior to  epiglottic inversion being achieved completely. This results in trace, transient penetration (PAS 2), which is considered WFL. No further penetration/aspiration noted with any following trials. Given Mod cueing, pt able to produce a strong cough. The pill was administered with thin liquids and was [facility], clearing from the esophagus promptly and  completely. Given pt's cognition, recommend Dys 3 diet with thin liquids. Meds may be given whole with thin liquids. Will continue to follow. Factors that may increase risk of adverse event in presence of aspiration Rubye Oaks & Clearance Coots 2021): Factors that may increase risk of adverse event in presence of aspiration Rubye Oaks & Clearance Coots 2021): Limited mobility Recommendations/Plan: Swallowing Evaluation Recommendations Swallowing Evaluation Recommendations Recommendations: PO diet PO Diet Recommendation: Dysphagia 3 (Mechanical soft); Thin liquids (Level 0) Liquid Administration via: Cup; Straw Medication Administration: Whole meds with liquid Supervision: Full assist for feeding; Full supervision/cueing for swallowing strategies Swallowing strategies  : Minimize environmental distractions; Slow rate; Small bites/sips Postural changes: Position pt fully upright for meals Oral care recommendations: Oral care BID (2x/day) Treatment Plan Treatment Plan Treatment recommendations: Therapy as outlined in treatment plan below Follow-up recommendations: Acute inpatient rehab (3 hours/day) Functional status assessment: Patient has had a recent decline in their functional status and demonstrates the ability to make significant improvements in function in a reasonable and predictable amount of time. Treatment frequency: Min 2x/week Treatment duration: 2 weeks Interventions: Aspiration precaution training; Patient/family education; Trials of upgraded texture/liquids; Diet toleration management by SLP Recommendations Recommendations for follow up therapy are one component of a multi-disciplinary discharge planning process, led by the attending physician.  Recommendations may be updated based on patient status, additional functional criteria and insurance authorization. Assessment: Orofacial Exam: Orofacial Exam Oral Cavity: Oral Hygiene: WFL Oral Cavity - Dentition: Dentures, top; Dentures, bottom Orofacial Anatomy: WFL Oral  Motor/Sensory Function: WFL Anatomy: Anatomy: WFL Boluses Administered: Boluses Administered Boluses Administered: Thin liquids (Level 0); Mildly thick liquids (Level 2, nectar thick); Moderately thick liquids (Level 3, honey thick); Puree; Solid  Oral Impairment Domain: Oral Impairment Domain Lip Closure: No labial escape Tongue control during bolus hold: Cohesive bolus between tongue to palatal seal Bolus preparation/mastication: Timely and efficient chewing and mashing Bolus transport/lingual motion: Delayed initiation of tongue motion (oral holding) Oral residue: Complete oral clearance Location of oral residue : N/A Initiation of pharyngeal swallow : Posterior laryngeal surface of the epiglottis  Pharyngeal Impairment Domain: Pharyngeal Impairment Domain Soft palate elevation: No bolus between soft palate (SP)/pharyngeal wall (PW) Laryngeal elevation: Complete superior movement of thyroid cartilage with complete approximation of arytenoids to epiglottic petiole Anterior hyoid excursion: Complete anterior movement Epiglottic movement: Complete inversion Laryngeal vestibule closure: Incomplete, narrow column air/contrast in laryngeal vestibule Pharyngeal stripping wave : Present - complete Pharyngeal contraction (A/P view only): N/A Pharyngoesophageal segment opening: Complete distension and complete duration, no obstruction of flow Tongue base retraction: No contrast between tongue base and posterior pharyngeal wall (PPW) Pharyngeal residue: Trace residue within or on pharyngeal structures Location of pharyngeal residue: Pyriform sinuses  Esophageal Impairment Domain: Esophageal Impairment Domain Esophageal clearance upright position: Complete clearance, esophageal coating Pill: Pill Consistency administered: Thin liquids (Level 0) Thin liquids (Level 0): [facility] Penetration/Aspiration Scale Score: Penetration/Aspiration Scale Score 1.  Material does not enter airway: Mildly thick liquids (Level 2, nectar thick);  Moderately thick liquids (Level 3, honey thick); Puree; Solid; Pill 2.  Material enters airway, remains ABOVE vocal cords then ejected out: Thin liquids (Level 0) Compensatory Strategies: Compensatory Strategies Compensatory strategies: Yes Straw: Effective Effective Straw: Thin liquid (  Level 0); Mildly thick liquid (Level 2, nectar thick)   General Information: Caregiver present: Yes  Diet Prior to this Study: NPO   Temperature : Normal   Respiratory Status: WFL   Supplemental O2: Nasal cannula   History of Recent Intubation: Yes  Behavior/Cognition: Alert; Cooperative; Requires cueing Self-Feeding Abilities: Needs assist with self-feeding Baseline vocal quality/speech: Hypophonia/low volume Volitional Cough: Able to elicit Volitional Swallow: Able to elicit Exam Limitations: No limitations Goal Planning: Prognosis for improved oropharyngeal function: Good Barriers to Reach Goals: Cognitive deficits; Language deficits No data recorded Patient/Family Stated Goal: none stated Consulted and agree with results and recommendations: Patient; Family member/caregiver Pain: Pain Assessment Pain Assessment: No/denies pain Facial Expression: 0 Body Movements: 0 Muscle Tension: 0 Compliance with ventilator (intubated pts.): N/A Vocalization (extubated pts.): 0 CPOT Total: 0 End of Session: Start Time:SLP Start Time (ACUTE ONLY): 1217 Stop Time: SLP Stop Time (ACUTE ONLY): 1237 Time Calculation:SLP Time Calculation (min) (ACUTE ONLY): 20 min Charges: SLP Evaluations $ SLP Speech Visit: 1 Visit SLP Evaluations $BSS Swallow: 1 Procedure $MBS Swallow: 1 Procedure SLP visit diagnosis: SLP Visit Diagnosis: Dysphagia, oropharyngeal phase (R13.12) Past Medical History: Past Medical History: Diagnosis Date  AMPUTATION, BELOW KNEE, HX OF 04/08/2008  Arthritis   "I think I do; just in my fingers & my hands"  Blood transfusion   Cataract   Chronic pain   Depression   Patient states he has never been depressed.  Diabetes mellitus without  complication [facility])   no since pancreas transplant  Dialysis patient [facility]) 04/18/2012  "[facility]; [place], [place], [place]"  Gastroparesis   Gastropathy   GERD (gastroesophageal reflux disease)   Hypertension   MRSA infection   over 10 years ago per patient. in legs Past Surgical History: Past Surgical History: Procedure Laterality Date  AV FISTULA PLACEMENT  08/2011  left upper arm  BELOW KNEE LEG AMPUTATION  "it's been awhile"  bilaterally  CATARACT EXTRACTION  ~ 2011  right  COMBINED KIDNEY-PANCREAS TRANSPLANT  2014  ESOPHAGOGASTRODUODENOSCOPY N/A 12/30/2016  Procedure: ESOPHAGOGASTRODUODENOSCOPY (EGD);  Surgeon: Jeani Hawking, MD;  Location: [facility] ENDOSCOPY;  Service: Endoscopy;  Laterality: N/A;  OLECRANON BURSECTOMY Right 06/23/2020  Procedure: RIGHT ELBOW EXCISION OLECRANON BURSITIS;  Surgeon: Nadara Mustard, MD;  Location: [facility];  Service: Orthopedics;  Laterality: Right; Gwynneth Aliment, M.A., CF-SLP Speech Language Pathology, Acute Rehabilitation Services Secure Chat preferred [phone] 09/20/2023, 1:59 PM   Vitals:   09/20/23 0300 09/20/23 0400 09/20/23 0500 09/20/23 0600  BP: (!) 175/89 (!) 174/79    Pulse: 87 90 83 74  Resp: 20 (!) 21 19 17   Temp: 99.7 F (37.6 C) 99.5 F (37.5 C) 99.3 F (37.4 C) 99 F (37.2 C)  TempSrc:      SpO2: 100% 100% 100% 100%  Weight:   83.9 kg    PHYSICAL EXAM General: Critically ill appearing African-American male not in distress Psych: Slow to respond CV: Regular rate and rhythm on monitor Respiratory: Respirations assisted via mechanical ventilation, spontaneous respirations over set vent rate present GI: Abdomen soft and nontender  NEURO:  Mental Status: Patient is awake and interactive.  Speech is hesitant slow but clear.  No dysarthria or aphasia patient follows commands.    Cranial Nerves:  II: PERRL, small pupils III, IV, VI: Patient fixates and tracks examiner throughout V: Corneals intact VII: Facial symmetry  obscured due to ETT VIII: Hearing is intact to voice. IX, X: Cough and gag reflexes are intact XI: Head  is grossly midline XII: Does not protrude tongue Motor: Right upper extremity with minimal movement, left upper extremity is purposeful with antigravity movement to command.  Left lower extremity is purposeful, right lower extremity withdrawals with light application of noxious stimuli.  Right lower extremity is slightly weaker than left.  Bilateral BKA's noted. Tone: is normal and bulk is normal Sensation: Reacts to touch throughout. Coordination: Does not perform Gait: Deferred in acute setting  ASSESSMENT/PLAN  Acute Ischemic Infarct: Watershed appearing scattered small foci of acute subcortical infarction in the left occipital lobe and posterior left frontal lobe Etiology:  watershed in the setting of severe hypotensive event / vasodilatory versus anaphylactic shock with occluded left ICA with reconstitution of the communicating segment CT head no acute intracranial hemorrhage or evidence of acute large vessel territory infarct.  Aspects score is 10.  Stable mild chronic small vessel disease. Repeat CTH: No acute intracranial abnormality.  Previously identified small volume left cerebral infarct not well-visualized by CT. MRI brain/MRA head: scattered small foci of acute subcortical infarction in the left occipital lobe and posterior left frontal lobe.  Faint diffusion signal abnormality along the left parietal lobe may reflect evolving infarct versus seizure related to cytotoxic edema.  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 Carotid Doppler US ordered, pending  2D Echo ordered, pending  LDL 90 HgbA1c 5.8 VTE prophylaxis - SQ Lovenox No antithrombotic prior to admission, now on aspirin 81 mg daily and clopidogrel 75 mg daily for 3 weeks and then aspirin alone. Therapy recommendations:  pending Disposition:  pending  Essential Hypertension Home meds:  Carvedilol, hydralazine Unstable Blood Pressure Goal: BP less than 220/110  Avoid hypotension   Hypotension during admission Concern for Vasodilatory/anaphylactic shock Presenting systolic blood pressure in the 60s Patient is requiring levo gtt for blood pressure support  Hyperlipidemia Home meds: Rosuvastatin 10 mg, resumed in hospital LDL 90, goal < 70 Increase rosuvastatin to 20 mg PO daily Continue statin at discharge  Remote tobacco abuse Stopped smoking cigars over one year ago  Remote Substance Abuse Previously reported marijuana use UDS negative on admission   Other Stroke Risk Factors Coronary artery disease  Other Active Problems PAD s/p bilateral BKA AKI on CKD stage IIIa Chronic immunosuppressive therapy due to renal and pancreatic transplant Shock: Concern for vasodilatory/anaphylactic shock Acidosis improving on bicarb drip Continues to require levo for blood pressure support Recent admission for E. coli UTI UA on admission negative  Hospital day # 2  Patient presented with shock with hypotension and MRI scan shows small watershed left ACA's/MCA and MCA/PCA infarcts in the setting of left ICA occlusion..  Neurological exam is limited due to sedation and intubation but patient clearly has mild right hemiparesis arm greater than leg.  Recommend continue ventilatory support and wean off sedation and respiratory support as per critical care team.  Maintain permissive hypertension with systolic in the 120-140 range at least.  Check echo cardiogram and carotid ultrasound.  Long discussion with patient and wife at the bedside and answered questions.. nt. This patient is critically ill and at significant risk of neurological worsening, death and care requires constant monitoring of vital signs, hemodynamics,respiratory and cardiac monitoring, extensive review of multiple databases, frequent neurological assessment, discussion with family, other specialists and medical  decision making of high complexity.I have made any additions or clarifications directly to the above note.This critical care time does not reflect procedure time, or teaching time or supervisory time of PA/NP/Med Resident etc but could involve  care discussion time.  I spent 30 minutes of neurocritical care time  in the care of  this patient.    Delia Heady, MD Medical Director [facility] Pager: [phone] 09/20/2023 4:15 PM  To contact Stroke Continuity provider, please refer to [email or web address]. After hours, contact General Neurology

## 2023-09-20 NOTE — Progress Notes (Signed)
Inpatient Rehab Admissions Coordinator:   Per therapy recommendations, patient was screened for CIR candidacy by Megan Salon, MS, CCC-SLP . At this time, Pt. is not yet at a level to tolerate the intensity of CIR. However,   Pt. may have potential to progress to becoming a potential CIR candidate, so CIR admissions team will follow and monitor for progress and participation with therapies and place consult order if Pt. appears to be an appropriate candidate. Please contact me with any questions.   Megan Salon, MS, CCC-SLP Rehab Admissions Coordinator  [phone] (celll) [phone] (office)

## 2023-09-20 NOTE — Progress Notes (Signed)
Carotid duplex has been completed.   Results can be found under chart review under CV PROC. 09/20/2023 4:46 PM Shanen Norris RVT, RDMS

## 2023-09-20 NOTE — Evaluation (Signed)
Occupational Therapy Evaluation Patient Details Name: [name] MRN: [id number] DOB: Dec 25, 1976 Today's Date: 09/20/2023   History of Present Illness Pt is a 47 y.o. male who presented 09/18/23 from infusion center where he was receiving his Belatacept infusion for chronic immunosuppressive therapy and during his infusion he became hypotensive and had full body burning sensation. Pt received IM epinephrine on ED arrival with improvement in symptoms but noted difficulty speaking and facial droop. Code stroke activated. Imaging revealed scattered small foci of acute subcortical infarction in the left occipital lobe and posterior left frontal lobe and occluded left ICA. PMH includes HTN, DMII, PAD s/p bilateral BKA, renal and pancreatic transplant, depression, GERD.   Clinical Impression   Prior to this admission, patient was independent, driving, and living with his wife. Currently, patient presenting with decreased attention/neglect to R side, cogntive impairments, decreased visual tracking, no activation in RUE, and minimal activation in RLE. Patient mod A to total A for ADL management.. Patient is requiring modA to roll and modAx2 to transition supine <> sit EOB. He is also requiring modA the majority of the time for sitting balance as he tends to lean posteriorly and laterally to the R. As patient has had a drastic functional decline, he could greatly benefit from intensive inpatient rehab, > 3 hours/day. Will continue to follow acutely.       If plan is discharge home, recommend the following: Two people to help with walking and/or transfers;Two people to help with bathing/dressing/bathroom;Assistance with cooking/housework;Direct supervision/assist for medications management;Assistance with feeding;Direct supervision/assist for financial management;Assist for transportation;Help with stairs or ramp for entrance;Supervision due to cognitive status    Functional Status Assessment  Patient  has had a recent decline in their functional status and demonstrates the ability to make significant improvements in function in a reasonable and predictable amount of time.  Equipment Recommendations  Other (comment) (defer to next venue)    Recommendations for Other Services Rehab consult     Precautions / Restrictions Precautions Precautions: Fall;Other (comment) Precaution Comments: A-line, BP < 220/110 but avoid hypotension, bil BKA hx (prostheses in room) Restrictions Weight Bearing Restrictions: No      Mobility Bed Mobility Overal bed mobility: Needs Assistance Bed Mobility: Sit to Supine, Supine to Sit, Rolling Rolling: Mod assist   Supine to sit: Mod assist, +2 for physical assistance, +2 for safety/equipment, HOB elevated Sit to supine: Mod assist, +2 for physical assistance, +2 for safety/equipment, HOB elevated   General bed mobility comments: Pt cued to reach L UE to R side of bed to roll, modA at hips to rotate. ModAx2 to control trunk and scoot hips to L EOB using bed pad. Pt initiated trying to ascend his trunk but had difficulty managing his R extremities. Pt initiated laying back down with his trunk but needed assistance for guidance and R extremity management.    Transfers                   General transfer comment: deferred      Balance Overall balance assessment: Needs assistance Sitting-balance support: No upper extremity supported, Feet supported Sitting balance-Leahy Scale: Poor Sitting balance - Comments: ModA majority of time to prevent posterior and R lateral LOB, intermittent moments of minA and even up to maxA as he fatigued. Postural control: Posterior lean, Right lateral lean     Standing balance comment: deferred  ADL either performed or assessed with clinical judgement   ADL Overall ADL's : Needs assistance/impaired Eating/Feeding: NPO   Grooming: Moderate assistance;Sitting   Upper Body  Bathing: Moderate assistance;Sitting   Lower Body Bathing: Maximal assistance;Total assistance;Sitting/lateral leans;Sit to/from stand   Upper Body Dressing : Moderate assistance;Sitting   Lower Body Dressing: Maximal assistance;Total assistance;Sit to/from stand;Sitting/lateral leans     Toilet Transfer Details (indicate cue type and reason): did not assess to date Toileting- Architect and Hygiene: Total assistance;Bed level       Functional mobility during ADLs: Maximal assistance;+2 for safety/equipment;+2 for physical assistance;Cueing for safety;Cueing for sequencing General ADL Comments: Patient presenting with decreased attention/neglect to R side, cogntive impairments, decreased visual tracking, no activation in RUE, and minimal activation in RLE. Patient mod A to total A for ADL management.     Vision Baseline Vision/History: 1 Wears glasses Ability to See in Adequate Light: 0 Adequate Patient Visual Report: No change from baseline Vision Assessment?: Yes Eye Alignment: Within Functional Limits Ocular Range of Motion: Impaired-to be further tested in functional context Alignment/Gaze Preference: Head tilt Tracking/Visual Pursuits: Impaired - to be further tested in functional context Saccades: Impaired - to be further tested in functional context Convergence: Impaired - to be further tested in functional context Visual Fields: Impaired-to be further tested in functional context Additional Comments: Patient unable to visually track with OT to date, neglect tendencies noted to the R, will continue to assess     Perception Perception: Impaired Preception Impairment Details: Inattention/Neglect, Body Part identification, Body Scheme     Praxis Praxis: Impaired Praxis Impairment Details: Initiation, Ideation, Ideomotor, Perseveration, Motor planning, Organization     Pertinent Vitals/Pain Pain Assessment Pain Assessment: No/denies pain     Extremity/Trunk  Assessment Upper Extremity Assessment Upper Extremity Assessment: RUE deficits/detail;Difficult to assess due to impaired cognition RUE Deficits / Details: zero activation of RUE, potential trace at trapezius, but questionable, is able to state when he feels sensation on RUE, but is consistent and unable to name where RUE Sensation: decreased proprioception RUE Coordination: decreased fine motor;decreased gross motor   Lower Extremity Assessment Lower Extremity Assessment: Defer to PT evaluation   Cervical / Trunk Assessment Cervical / Trunk Assessment: Normal   Communication Communication Communication: Difficulty following commands/understanding;Difficulty communicating thoughts/reduced clarity of speech Following commands: Follows one step commands with increased time;Follows one step commands inconsistently Cueing Techniques: Verbal cues;Tactile cues   Cognition Arousal: Alert Behavior During Therapy: Flat affect Overall Cognitive Status: Impaired/Different from baseline Area of Impairment: Orientation, Attention, Memory, Following commands, Safety/judgement, Problem solving, Awareness                 Orientation Level: Disoriented to, Person (did not ask other questions) Current Attention Level: Focused Memory: Decreased short-term memory Following Commands: Follows one step commands inconsistently, Follows one step commands with increased time Safety/Judgement: Decreased awareness of safety, Decreased awareness of deficits Awareness: Intellectual Problem Solving: Slow processing, Difficulty sequencing, Decreased initiation, Requires verbal cues, Requires tactile cues General Comments: Pt maintains a primarily flat affect, smiling only a couple times during session. Per wife, pt is normally very talkative and interactive. Pt stated his birthday was "[birth year]" and unable to state the year pf his birth even when given extra time. Slow to process cues and inattention to R  side. Perseverates on "fingertips" when asked where he was touched.     General Comments       Exercises     Shoulder Instructions  Home Living Family/patient expects to be discharged to:: Private residence Living Arrangements: Spouse/significant other Available Help at Discharge: Family Type of Home: House Home Access: Stairs to enter [employer] of Steps: 4 Entrance Stairs-Rails: Right Home Layout: Two level;Able to live on main level with bedroom/bathroom     Bathroom Shower/Tub: Chief Strategy Officer: Standard     Home Equipment: Hand held shower head          Prior Functioning/Environment Prior Level of Function : Independent/Modified Independent;Driving;Working/employed             Mobility Comments: No AD using bil prostheses to ambulate ADLs Comments: 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 [employer]        OT Problem List: Decreased strength;Decreased range of motion;Decreased activity tolerance;Impaired balance (sitting and/or standing);Impaired vision/perception;Decreased coordination;Decreased cognition;Decreased safety awareness;Decreased knowledge of use of DME or AE;Decreased knowledge of precautions;Impaired sensation;Impaired UE functional use      OT Treatment/Interventions: Self-care/ADL training;Therapeutic exercise;Neuromuscular education;Energy conservation;DME and/or AE instruction;Manual therapy;Therapeutic activities;Cognitive remediation/compensation;Visual/perceptual remediation/compensation;Patient/family education;Balance training    OT Goals(Current goals can be found in the care plan section) Acute Rehab OT Goals Patient Stated Goal: unable to state OT Goal Formulation: Patient unable to participate in goal setting Time For Goal Achievement: 10/04/23 Potential to Achieve Goals: Good  OT Frequency: Min 1X/week    Co-evaluation   Reason for Co-Treatment: Complexity of the patient's  impairments (multi-system involvement);Necessary to address cognition/behavior during functional activity;For patient/therapist safety;To address functional/ADL transfers PT goals addressed during session: Mobility/safety with mobility;Balance        AM-PAC OT "6 Clicks" Daily Activity     Outcome Measure Help from another person eating meals?: Total (NPO) Help from another person taking care of personal grooming?: A Lot Help from another person toileting, which includes using toliet, bedpan, or urinal?: Total Help from another person bathing (including washing, rinsing, drying)?: A Lot Help from another person to put on and taking off regular upper body clothing?: A Lot Help from another person to put on and taking off regular lower body clothing?: Total 6 Click Score: 9   End of Session Nurse Communication: Mobility status  Activity Tolerance: Patient limited by lethargy Patient left: in bed;with call bell/phone within reach;with bed alarm set;with family/visitor present  OT Visit Diagnosis: Unsteadiness on feet (R26.81);Other abnormalities of gait and mobility (R26.89);Muscle weakness (generalized) (M62.81);Low vision, both eyes (H54.2);Other symptoms and signs involving the nervous system (R29.898);Other symptoms and signs involving cognitive function;Cognitive communication deficit (R41.841);Hemiplegia and hemiparesis Symptoms and signs involving cognitive functions: Cerebral infarction Hemiplegia - Right/Left: Right Hemiplegia - dominant/non-dominant: Dominant Hemiplegia - caused by: Cerebral infarction                Time: 6962-9528 OT Time Calculation (min): 31 min Charges:  OT General Charges $OT Visit: 1 Visit OT Evaluation $OT Eval Moderate Complexity: 1 Mod  Pollyann Glen E. Weslynn Ke, OTR/L Acute Rehabilitation Services [phone]   Cherlyn Cushing 09/20/2023, 4:13 PM

## 2023-09-20 NOTE — Progress Notes (Addendum)
Modified Barium Swallow Study  Patient Details  Name: [name] MRN: [id number] Date of Birth: [birth year]  Today's Date: 09/20/2023  Modified Barium Swallow completed.  Full report located under Chart Review in the Imaging Section.  History of Present Illness GERHART HIRSCHI is a 47 yo male presenting to ED 9/23 with AMS. Pt found to be aphasic with a facial droop and a code stroke was activated. Intubated 9/23-9/24. MRI Brain with scattered small foci of acute subcortical infarction in L occipital lobe and posterior L frontal lobe and occluded L ICA. Recently admitted 9/9-9/11 with findings of E coli UTI and poorly controlled HTN. PMH includes HTN, T2DM, PAD s/p bilateral BKA, s/p renal and pancreatic transplant on chronic immunosuppressive therapy, GERD   Clinical Impression Pt presents with an overall functional oropharyngeal swallow. With thin liquids in particular, the head of the bolus progresses to the level of the arytenoids prior to epiglottic inversion being achieved completely. This results in trace, transient penetration (PAS 2), which is considered WFL. No further penetration/aspiration noted with any following trials. Given Mod cueing, pt able to produce a strong cough. The pill was administered with thin liquids and was [facility], clearing from the esophagus promptly and completely. Given pt's cognition, recommend Dys 3 diet with thin liquids. Meds may be given whole with thin liquids. Will continue to follow. Factors that may increase risk of adverse event in presence of aspiration Rubye Oaks [employer] Clearance Coots 2021): Limited mobility  Swallow Evaluation Recommendations Recommendations: PO diet PO Diet Recommendation: Dysphagia 3 (Mechanical soft);Thin liquids (Level 0) Liquid Administration via: Cup;Straw Medication Administration: Whole meds with liquid Supervision: Full assist for feeding;Full supervision/cueing for swallowing strategies Swallowing strategies  : Minimize  environmental distractions;Slow rate;Small bites/sips Postural changes: Position pt fully upright for meals Oral care recommendations: Oral care BID (2x/day)      Gwynneth Aliment, M.A., CF-SLP Speech Language Pathology, Acute Rehabilitation Services  Secure Chat preferred [phone]  09/20/2023,1:56 PM

## 2023-09-20 NOTE — Progress Notes (Signed)
[facility] ADULT ICU REPLACEMENT PROTOCOL   The patient does apply for the [facility] Adult ICU Electrolyte Replacment Protocol based on the criteria listed below:   1.Exclusion criteria: TCTS, ECMO, Dialysis, and Myasthenia Gravis patients 2. Is GFR >/= 30 ml/min? Yes.    Patient's GFR today is 51 3. Is SCr </= 2? Yes.   Patient's SCr is 1.66 mg/dL 4. Did SCr increase >/= 0.5 in 24 hours? No. 5.Pt's weight >40kg  Yes.   6. Abnormal electrolyte(s):   K 3.1  7. Electrolytes replaced per protocol 8.  Call MD STAT for K+ </= 2.5, Phos </= 1, or Mag </= 1 Physician:  Shawn Stall R Shevon Sian 09/20/2023 6:05 AM

## 2023-09-20 NOTE — Evaluation (Addendum)
Clinical/Bedside Swallow Evaluation Patient Details  Name: [name] MRN: [id number] Date of Birth: 06-07-1976  Today's Date: 09/20/2023 Time: SLP Start Time (ACUTE ONLY): 4098 SLP Stop Time (ACUTE ONLY): 0940 SLP Time Calculation (min) (ACUTE ONLY): 19 min  Past Medical History:  Past Medical History:  Diagnosis Date   AMPUTATION, BELOW KNEE, 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   Diabetes mellitus without complication [facility])    no since pancreas transplant   Dialysis patient [facility]) 04/18/2012   "[facility]; [place], [place], Sat"   Gastroparesis    Gastropathy    GERD (gastroesophageal reflux disease)    Hypertension    MRSA infection    over 10 years ago per patient. in legs   Past Surgical History:  Past Surgical History:  Procedure Laterality Date   AV FISTULA PLACEMENT  08/2011   left upper arm   BELOW KNEE LEG AMPUTATION  "it's been awhile"   bilaterally   CATARACT EXTRACTION  ~ 2011   right   COMBINED KIDNEY-PANCREAS TRANSPLANT  2014   ESOPHAGOGASTRODUODENOSCOPY N/A 12/30/2016   Procedure: ESOPHAGOGASTRODUODENOSCOPY (EGD);  Surgeon: Jeani Hawking, MD;  Location: [facility] ENDOSCOPY;  Service: Endoscopy;  Laterality: N/A;   OLECRANON BURSECTOMY Right 06/23/2020   Procedure: RIGHT ELBOW EXCISION OLECRANON BURSITIS;  Surgeon: Nadara Mustard, MD;  Location: [facility];  Service: Orthopedics;  Laterality: Right;   HPI:  [name] is a 47 yo male presenting to ED 9/23 with AMS. Pt found to be aphasic with a facial droop and a code stroke was activated. Intubated 9/23-9/24. MRI Brain with scattered small foci of acute subcortical infarction in L occipital lobe and posterior L frontal lobe and occluded L ICA. Recently admitted 9/9-9/11 with findings of E coli UTI and poorly controlled HTN. PMH  includes HTN, T2DM, PAD s/p bilateral BKA, s/p renal and pancreatic transplant on chronic immunosuppressive therapy, GERD    Assessment / Plan / Recommendation  Clinical Impression  Pt presents with a flat affect and low vocal quality. His wife reports he has a history of reflux, which he manages with daily medication. Oral motor exam with mild R facial asymmetry. Initial sips of thin liquids without clinical signs concerning for aspiration, although when challenged with consecutive sips via straw, pt had an immediate cough. His cough is weak and mildly congested. Trials of purees and solids overall WFL. Given these factors, plan to complete an MBS which is tentatively scheduled for this afternoon. SLP Visit Diagnosis: Dysphagia, unspecified (R13.10)    Aspiration Risk  Mild aspiration risk    Diet Recommendation NPO except meds    Medication Administration: Crushed with puree    Other  Recommendations Oral Care Recommendations: Oral care QID    Recommendations for follow up therapy are one component of a multi-disciplinary discharge planning process, led by the attending physician.  Recommendations may be updated based on patient status, additional functional criteria and insurance authorization.  Follow up Recommendations Acute inpatient rehab (3hours/day)      Assistance Recommended at Discharge    Functional Status Assessment Patient has had a recent decline in their functional status and demonstrates the ability to make significant improvements in function in a reasonable and predictable amount of time.  Frequency  and Duration min 2x/week  2 weeks       Prognosis Prognosis for improved oropharyngeal function: Good Barriers to Reach Goals: Cognitive deficits      Swallow Study   General HPI: [name] is a 48 yo male presenting to ED 9/23 with AMS. Pt found to be aphasic with a facial droop and a code stroke was activated. Intubated 9/23-9/24. MRI Brain with scattered  small foci of acute subcortical infarction in L occipital lobe and posterior L frontal lobe and occluded L ICA. Recently admitted 9/9-9/11 with findings of E coli UTI and poorly controlled HTN. PMH includes HTN, T2DM, PAD s/p bilateral BKA, s/p renal and pancreatic transplant on chronic immunosuppressive therapy, GERD Type of Study: Bedside Swallow Evaluation Previous Swallow Assessment: none in chart Diet Prior to this Study: NPO Temperature Spikes Noted: No Respiratory Status: Nasal cannula (2L) History of Recent Intubation: Yes Total duration of intubation (days): 2 days Date extubated: 09/19/23 Behavior/Cognition: Alert;Cooperative;Requires cueing Oral Cavity Assessment: Within Functional Limits Oral Care Completed by SLP: No Oral Cavity - Dentition: Adequate natural dentition Vision: Functional for self-feeding Self-Feeding Abilities: Total assist Patient Positioning: Upright in bed Baseline Vocal Quality: Low vocal intensity;Suspected CN X (Vagus) involvement Volitional Cough: Weak Volitional Swallow: Able to elicit    Oral/Motor/Sensory Function Overall Oral Motor/Sensory Function: Mild impairment Facial ROM: Reduced right Facial Symmetry: Abnormal symmetry right Lingual ROM: Within Functional Limits Lingual Symmetry: Within Functional Limits Lingual Strength: Within Functional Limits Lingual Sensation: Within Functional Limits   Ice Chips Ice chips: Not tested   Thin Liquid Thin Liquid: Impaired Presentation: Straw Pharyngeal  Phase Impairments: Cough - Immediate;Multiple swallows    Nectar Thick Nectar Thick Liquid: Not tested   Honey Thick Honey Thick Liquid: Not tested   Puree Puree: Within functional limits Presentation: Spoon   Solid     Solid: Within functional limits      Gwynneth Aliment, M.A., CF-SLP Speech Language Pathology, Acute Rehabilitation Services  Secure Chat preferred [phone]  09/20/2023,9:56 AM

## 2023-09-20 NOTE — TOC CM/SW Note (Signed)
Transition of Care [facility]) - Inpatient Brief Assessment   Patient Details  Name: [name] MRN: [id number] Date of Birth: 1976-02-23  Transition of [facility] CM/SW Contact:    Mearl Latin, LCSW Phone Number: 09/20/2023, 3:24 PM   Clinical Narrative: Patient admitted from home with significant other. Therapy currently recommending CIR but CSW will follow in the event patient cannot tolerate CIR.    Transition of Care Asessment: Insurance and Status: Insurance coverage has been reviewed Patient has primary care physician: Yes Home environment has been reviewed: From home Prior level of function:: Bilat BKA with prosthesis Prior/Current Home Services: No current home services Social Determinants of Health Reivew: SDOH reviewed no interventions necessary Readmission risk has been reviewed: Yes Transition of care needs: transition of care needs identified, TOC will continue to follow

## 2023-09-20 NOTE — Plan of Care (Signed)
Problem: Coping: Goal: Ability to adjust to condition or change in health will improve Outcome: Progressing

## 2023-09-20 NOTE — Evaluation (Addendum)
Physical Therapy Evaluation Patient Details Name: RAYLIN BUTLAND MRN: [id number] DOB: 12/30/1975 Today's Date: 09/20/2023  History of Present Illness  Pt is a 47 y.o. male who presented 09/18/23 from infusion center where he was receiving his Belatacept infusion for chronic immunosuppressive therapy and during his infusion he became hypotensive and had full body burning sensation. Pt received IM epinephrine on ED arrival with improvement in symptoms but noted difficulty speaking and facial droop. Code stroke activated. Imaging revealed scattered small foci of acute subcortical infarction in the left occipital lobe and posterior left frontal lobe and occluded left ICA. PMH includes HTN, DMII, PAD s/p bilateral BKA, renal and pancreatic transplant, depression, GERD.   Clinical Impression  Pt presents with condition above and deficits mentioned below, see PT Problem List. PTA, he was independent utilizing his bil lower extremity prostheses to mobilize. He lives with his wife in a 2-level house with 4 STE. He does not need to go upstairs. Currently, pt is demonstrating a flat affect along with deficits in communication and cognition. His wife reports he is normally very talkative and friendly. Pt is also inattentive to his R side with deficits in R-sided strength and coordination. At this time, he is requiring modA to roll and modAx2 to transition supine <> sit EOB. He is also requiring modA the majority of the time for sitting balance as he tends to lean posteriorly and laterally to the R. As pt has had a drastic functional decline, he could greatly benefit from intensive inpatient rehab, > 3 hours/day. Will continue to follow acutely.      If plan is discharge home, recommend the following: Assist for transportation;Two people to help with walking and/or transfers;Two people to help with bathing/dressing/bathroom;Assistance with cooking/housework;Direct supervision/assist for medications  management;Direct supervision/assist for financial management;Help with stairs or ramp for entrance   Can travel by private vehicle        Equipment Recommendations Other (comment) (TBD)  Recommendations for Other Services  Rehab consult    Functional Status Assessment Patient has had a recent decline in their functional status and demonstrates the ability to make significant improvements in function in a reasonable and predictable amount of time.     Precautions / Restrictions Precautions Precautions: Fall;Other (comment) Precaution Comments: A-line, BP < 220/110 but avoid hypotension, bil BKA hx (prostheses in room) Restrictions Weight Bearing Restrictions: No      Mobility  Bed Mobility Overal bed mobility: Needs Assistance Bed Mobility: Sit to Supine, Supine to Sit, Rolling Rolling: Mod assist   Supine to sit: Mod assist, +2 for physical assistance, +2 for safety/equipment, HOB elevated Sit to supine: Mod assist, +2 for physical assistance, +2 for safety/equipment, HOB elevated   General bed mobility comments: Pt cued to reach L UE to R side of bed to roll, modA at hips to rotate. ModAx2 to control trunk and scoot hips to L EOB using bed pad. Pt initiated trying to ascend his trunk but had difficulty managing his R extremities. Pt initiated laying back down with his trunk but needed assistance for guidance and R extremity management.    Transfers                   General transfer comment: deferred    Ambulation/Gait               General Gait Details: deferred  Stairs            Wheelchair Mobility     Tilt Bed  Modified Rankin (Stroke Patients Only) Modified Rankin (Stroke Patients Only) Pre-Morbid Rankin Score: No symptoms Modified Rankin: Severe disability     Balance Overall balance assessment: Needs assistance Sitting-balance support: No upper extremity supported, Feet supported Sitting balance-Leahy Scale: Poor Sitting  balance - Comments: ModA majority of time to prevent posterior and R lateral LOB, intermittent moments of minA and even up to maxA as he fatigued. Postural control: Posterior lean, Right lateral lean     Standing balance comment: deferred                             Pertinent Vitals/Pain Pain Assessment Pain Assessment: No/denies pain    Home Living Family/patient expects to be discharged to:: Private residence Living Arrangements: Spouse/significant other Available Help at Discharge: Family Type of Home: House Home Access: Stairs to enter Entrance Stairs-Rails: Right Entrance Stairs-Number of Steps: 4   Home Layout: Two level;Able to live on main level with bedroom/bathroom Home Equipment: Hand held shower head      Prior Function Prior Level of Function : Independent/Modified Independent;Driving;Working/employed             Mobility Comments: No AD using bil prostheses to ambulate ADLs Comments: 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 [employer]     Extremity/Trunk Assessment   Upper Extremity Assessment Upper Extremity Assessment: Defer to OT evaluation    Lower Extremity Assessment Lower Extremity Assessment: RLE deficits/detail;LLE deficits/detail RLE Deficits / Details: hx of BKA; R leg weaker than L with gross MMT scores of 2+ to 3-, inconsistent supine vs sitting; unsure of extent of sensation present in leg or not as pt perseverated on "fingertips" when asked where he was touched LLE Deficits / Details: hx of BKA, MMT scores grossly >/= 4    Cervical / Trunk Assessment Cervical / Trunk Assessment: Normal  Communication   Communication Communication: Difficulty following commands/understanding;Difficulty communicating thoughts/reduced clarity of speech Following commands: Follows one step commands inconsistently;Follows one step commands with increased time Cueing Techniques: Verbal cues;Tactile cues  Cognition Arousal:  Alert Behavior During Therapy: Flat affect Overall Cognitive Status: Impaired/Different from baseline Area of Impairment: Orientation, Attention, Memory, Following commands, Safety/judgement, Problem solving, Awareness                 Orientation Level: Disoriented to, Person (did not ask other questions) Current Attention Level: Focused Memory: Decreased short-term memory Following Commands: Follows one step commands inconsistently, Follows one step commands with increased time Safety/Judgement: Decreased awareness of safety, Decreased awareness of deficits Awareness: Intellectual Problem Solving: Slow processing, Difficulty sequencing, Decreased initiation, Requires verbal cues, Requires tactile cues General Comments: Pt maintains a primarily flat affect, smiling only a couple times during session. Per wife, pt is normally very talkative and interactive. Pt stated his birthday was "[birth year]" and unable to state the year pf his birth even when given extra time. Slow to process cues and inattention to R side. Perseverates on "fingertips" when asked where he was touched.        General Comments General comments (skin integrity, edema, etc.): VSS; wife present and supportive    Exercises     Assessment/Plan    PT Assessment Patient needs continued PT services  PT Problem List Decreased balance;Decreased strength;Decreased activity tolerance;Decreased mobility;Decreased coordination;Decreased cognition;Decreased safety awareness;Impaired sensation       PT Treatment Interventions Balance training;Neuromuscular re-education;Gait training;DME instruction;Stair training;Functional mobility training;Therapeutic activities;Therapeutic exercise;Cognitive remediation;Patient/family education;Wheelchair mobility training  PT Goals (Current goals can be found in the Care Plan section)  Acute Rehab PT Goals Patient Stated Goal: to improve PT Goal Formulation: With  patient/family Time For Goal Achievement: 10/04/23 Potential to Achieve Goals: Good    Frequency Min 1X/week     Co-evaluation PT/OT/SLP Co-Evaluation/Treatment: Yes Reason for Co-Treatment: Complexity of the patient's impairments (multi-system involvement);Necessary to address cognition/behavior during functional activity;For patient/therapist safety;To address functional/ADL transfers PT goals addressed during session: Mobility/safety with mobility;Balance         AM-PAC PT "6 Clicks" Mobility  Outcome Measure Help needed turning from your back to your side while in a flat bed without using bedrails?: A Lot Help needed moving from lying on your back to sitting on the side of a flat bed without using bedrails?: Total Help needed moving to and from a bed to a chair (including a wheelchair)?: Total Help needed standing up from a chair using your arms (e.g., wheelchair or bedside chair)?: Total Help needed to walk in hospital room?: Total Help needed climbing 3-5 steps with a railing? : Total 6 Click Score: 7    End of Session Equipment Utilized During Treatment: Oxygen Activity Tolerance: Patient tolerated treatment well Patient left: in bed;with call bell/phone within reach;with bed alarm set;with family/visitor present;Other (comment) (rolled to R (was rolled to L upon arrival))   PT Visit Diagnosis: Other symptoms and signs involving the nervous system (R29.898);Unsteadiness on feet (R26.81);Other abnormalities of gait and mobility (R26.89);Muscle weakness (generalized) (M62.81);Difficulty in walking, not elsewhere classified (R26.2);Hemiplegia and hemiparesis Hemiplegia - Right/Left: Right Hemiplegia - caused by: Cerebral infarction    Time: 1022-1053 PT Time Calculation (min) (ACUTE ONLY): 31 min   Charges:   PT Evaluation $PT Eval Moderate Complexity: 1 Mod   PT General Charges $$ ACUTE PT VISIT: 1 Visit         Virgil Benedict, PT, DPT Acute Rehabilitation  Services  Office: [phone]   Bettina Gavia 09/20/2023, 12:10 PM

## 2023-09-21 ENCOUNTER — Inpatient Hospital Stay (HOSPITAL_COMMUNITY): Payer: 59

## 2023-09-21 DIAGNOSIS — I639 Cerebral infarction, unspecified: Secondary | ICD-10-CM | POA: Diagnosis not present

## 2023-09-21 DIAGNOSIS — G9341 Metabolic encephalopathy: Secondary | ICD-10-CM | POA: Diagnosis not present

## 2023-09-21 DIAGNOSIS — I6389 Other cerebral infarction: Secondary | ICD-10-CM

## 2023-09-21 DIAGNOSIS — T782XXA Anaphylactic shock, unspecified, initial encounter: Secondary | ICD-10-CM | POA: Diagnosis not present

## 2023-09-21 LAB — BASIC METABOLIC PANEL
Anion gap: 11 (ref 5–15)
BUN: 11 mg/dL (ref 6–20)
CO2: 24 mmol/L (ref 22–32)
Calcium: 8 mg/dL — ABNORMAL LOW (ref 8.9–10.3)
Chloride: 100 mmol/L (ref 98–111)
Creatinine, Ser: 1.49 mg/dL — ABNORMAL HIGH (ref 0.61–1.24)
GFR, Estimated: 58 mL/min — ABNORMAL LOW (ref >60)
Glucose, Bld: 106 mg/dL — ABNORMAL HIGH (ref 70–99)
Potassium: 3.5 mmol/L (ref 3.5–5.1)
Sodium: 135 mmol/L (ref 135–145)

## 2023-09-21 LAB — CBC
HCT: 42.3 % (ref 39.0–52.0)
Hemoglobin: 13.7 g/dL (ref 13.0–17.0)
MCH: 31 pg (ref 26.0–34.0)
MCHC: 32.4 g/dL (ref 30.0–36.0)
MCV: 95.7 fL (ref 80.0–100.0)
Platelets: 214 10*3/uL (ref 150–400)
RBC: 4.42 MIL/uL (ref 4.22–5.81)
RDW: 16.5 % — ABNORMAL HIGH (ref 11.5–15.5)
WBC: 11.1 10*3/uL — ABNORMAL HIGH (ref 4.0–10.5)
nRBC: 0 % (ref 0.0–0.2)

## 2023-09-21 LAB — LIPASE, BLOOD: Lipase: 27 U/L (ref 11–51)

## 2023-09-21 LAB — GLUCOSE, CAPILLARY
Glucose-Capillary: 107 mg/dL — ABNORMAL HIGH (ref 70–99)
Glucose-Capillary: 115 mg/dL — ABNORMAL HIGH (ref 70–99)
Glucose-Capillary: 124 mg/dL — ABNORMAL HIGH (ref 70–99)
Glucose-Capillary: 150 mg/dL — ABNORMAL HIGH (ref 70–99)
Glucose-Capillary: 154 mg/dL — ABNORMAL HIGH (ref 70–99)
Glucose-Capillary: 78 mg/dL (ref 70–99)
Glucose-Capillary: 90 mg/dL (ref 70–99)

## 2023-09-21 LAB — PHOSPHORUS: Phosphorus: 1.7 mg/dL — ABNORMAL LOW (ref 2.5–4.6)

## 2023-09-21 LAB — ECHOCARDIOGRAM COMPLETE
Height: 70 in
S' Lateral: 2.5 cm
Weight: 2959.46 oz

## 2023-09-21 LAB — MAGNESIUM: Magnesium: 2 mg/dL (ref 1.7–2.4)

## 2023-09-21 LAB — AMYLASE: Amylase: 90 U/L (ref 28–100)

## 2023-09-21 MED ORDER — CYCLOSPORINE MODIFIED (GENGRAF) 25 MG PO CAPS
50.0000 mg | ORAL_CAPSULE | Freq: Two times a day (BID) | ORAL | Status: DC
  Administered 2023-09-21 – 2023-09-27 (×13): 50 mg via ORAL
  Filled 2023-09-21 (×14): qty 2

## 2023-09-21 MED ORDER — ASPIRIN 81 MG PO TBEC
81.0000 mg | DELAYED_RELEASE_TABLET | Freq: Every day | ORAL | Status: DC
  Administered 2023-09-21 – 2023-09-29 (×9): 81 mg via ORAL
  Filled 2023-09-21 (×9): qty 1

## 2023-09-21 MED ORDER — CLOPIDOGREL BISULFATE 75 MG PO TABS
75.0000 mg | ORAL_TABLET | Freq: Every day | ORAL | Status: DC
  Administered 2023-09-21 – 2023-09-29 (×9): 75 mg via ORAL
  Filled 2023-09-21 (×9): qty 1

## 2023-09-21 MED ORDER — K PHOS MONO-SOD PHOS DI & MONO 155-852-130 MG PO TABS
500.0000 mg | ORAL_TABLET | Freq: Four times a day (QID) | ORAL | Status: AC
  Administered 2023-09-21 – 2023-09-22 (×4): 500 mg via ORAL
  Filled 2023-09-21 (×4): qty 2

## 2023-09-21 NOTE — Care Management Important Message (Signed)
Important Message  Patient Details  Name: [name] MRN: [id number] Date of Birth: 16-Sep-1976   Important Message Given:  Yes - Medicare IM     Dorena Bodo 09/21/2023, 2:53 PM

## 2023-09-21 NOTE — Progress Notes (Signed)
Physical Therapy Treatment Patient Details Name: [name] MRN: [id number] DOB: Oct 21, 1976 Today's Date: 09/21/2023   History of Present Illness Pt is a 47 y.o. male who presented 09/18/23 from infusion center where he was receiving his Belatacept infusion for chronic immunosuppressive therapy and during his infusion he became hypotensive and had full body burning sensation. Pt received IM epinephrine on ED arrival with improvement in symptoms but noted difficulty speaking and facial droop. Code stroke activated. Imaging revealed scattered small foci of acute subcortical infarction in the left occipital lobe and posterior left frontal lobe and occluded left ICA. PMH includes HTN, DMII, PAD s/p bilateral BKA, renal and pancreatic transplant, depression, GERD.    PT Comments  Tolerated treatment well. With Bil prosthetics donned pt able to perform transfer training (utilized [place] today for Rt knee block and safety.) Good strength on Lt side to power up but required up to mod assist +2 for safety and balance with Rt lean. Min assist +2 for boost to stand multiple times from [facility] paddles, working on midline control, weight shift, and Rt knee stability. Seated balance training (min assist) some reduced awareness of limits when reaching for items. Extensive education as stated below. Patient will benefit from intensive inpatient follow up therapy, >3 hours/day.  Patient will continue to benefit from skilled physical therapy services to further improve independence with functional mobility.   It will be important for pt to position Rt knee in extension at rest to prevent contracture, monitor bony prominences for skin break down around prosthetics, weight shift frequently in chair and limit seated time <1.5hr at a time until he can independently unload buttock with weight shifting techniques. All of these important precautions were discussed with pt and nursing. Geopad ordered to reduce risk of skin  breakdown and I am reaching out to team to potentially order Rt shrinker sock as a prophylactic measure in the event the he develops any fluctuating edema in Rt residual limb since he has significant hemiplegia and his mobility is greatly impaired.   If plan is discharge home, recommend the following: Assist for transportation;Two people to help with walking and/or transfers;Two people to help with bathing/dressing/bathroom;Assistance with cooking/housework;Direct supervision/assist for medications management;Direct supervision/assist for financial management;Help with stairs or ramp for entrance   Can travel by private vehicle        Equipment Recommendations  Other (comment) (TBD)    Recommendations for Other Services Rehab consult     Precautions / Restrictions Precautions Precautions: Fall;Other (comment) Precaution Comments: BP < 220/110 but avoid hypotension, bil BKA hx (prostheses in room) Restrictions Weight Bearing Restrictions: No     Mobility  Bed Mobility Overal bed mobility: Needs Assistance Bed Mobility: Supine to Sit     Supine to sit: Min assist     General bed mobility comments: Min assist for RLE and RUE while pt reaches with LUE for rail and EOB to sit up. Some assist for trunk to rise fully but performed majority on his own. Assist for balance at EOB due to reduced trunk control in midline.    Transfers Overall transfer level: Needs assistance Equipment used: Ambulation equipment used Transfers: Sit to/from Stand, Bed to chair/wheelchair/BSC Sit to Stand: Mod assist, +2 physical assistance, +2 safety/equipment, Via lift equipment, From elevated surface           General transfer comment: Mod assist +2 for boost to stand from bed using Stedy wearing bil prosthetics. Able to pull through LUE and push through LLE with  good strength. Rt side supported due to lean, RUE held on [place] rail. From paddles, pt able to perform multiple sit to stand transitions with  min assist +2 for safety. Emphasizing midline control, and Rt knee control without utilizing pad for knee block.    Ambulation/Gait               General Gait Details: deferred due to Rt hemiplegia   Stairs             Wheelchair Mobility     Tilt Bed    Modified Rankin (Stroke Patients Only) Modified Rankin (Stroke Patients Only) Pre-Morbid Rankin Score: No symptoms Modified Rankin: Severe disability     Balance Overall balance assessment: Needs assistance Sitting-balance support: Single extremity supported, Feet unsupported Sitting balance-Leahy Scale: Poor Sitting balance - Comments: Min-CGA sitting EOB, gradually improved with practice; reaching beyond LOS at times with reduced awareness. Education provided. Postural control: Posterior lean, Right lateral lean Standing balance support: Single extremity supported, Reliant on assistive device for balance Standing balance-Leahy Scale: Poor Standing balance comment: Stood with Stedy, bil prosthetics Rt knee block                            Cognition Arousal: Alert Behavior During Therapy: Flat affect Overall Cognitive Status: Impaired/Different from baseline Area of Impairment: Orientation, Memory, Following commands, Safety/judgement, Problem solving                 Orientation Level: Disoriented to   Memory: Decreased short-term memory Following Commands: Follows one step commands inconsistently, Follows one step commands with increased time Safety/Judgement: Decreased awareness of safety, Decreased awareness of deficits   Problem Solving: Slow processing, Difficulty sequencing, Decreased initiation, Requires verbal cues, Requires tactile cues General Comments: recalls year but not month.        Exercises Other Exercises Other Exercises: Rt knee extension at rest for passive extension.    General Comments General comments (skin integrity, edema, etc.): Assisted to donne bil  prosthetics. Educated on importance of Rt knee extension at rest to prevent contracture; regular skin checks including rest breaks from wearing sleeve and prosthetics to prevent skin breakdown; weight shifting to unload buttock every 30 min and limiting time in chair to 1.5hr max (also discussed with RN.) Geopad ordered; reaching out to team about shrinker for Rt BKA for prophylactic edema flucutations      Pertinent Vitals/Pain      Home Living       Type of Home: House                  Prior Function            PT Goals (current goals can now be found in the care plan section) Acute Rehab PT Goals Patient Stated Goal: to improve PT Goal Formulation: With patient/family Time For Goal Achievement: 10/04/23 Potential to Achieve Goals: Good Progress towards PT goals: Progressing toward goals    Frequency    Min 1X/week      PT Plan      Co-evaluation              AM-PAC PT "6 Clicks" Mobility   Outcome Measure  Help needed turning from your back to your side while in a flat bed without using bedrails?: A Lot Help needed moving from lying on your back to sitting on the side of a flat bed without using bedrails?: A Lot Help needed moving  to and from a bed to a chair (including a wheelchair)?: Total Help needed standing up from a chair using your arms (e.g., wheelchair or bedside chair)?: Total Help needed to walk in hospital room?: Total Help needed climbing 3-5 steps with a railing? : Total 6 Click Score: 8    End of Session Equipment Utilized During Treatment: Gait belt Activity Tolerance: Patient tolerated treatment well Patient left: with call bell/phone within reach;in chair;with chair alarm set Nurse Communication: Mobility status;Need for lift equipment PT Visit Diagnosis: Other symptoms and signs involving the nervous system (R29.898);Unsteadiness on feet (R26.81);Other abnormalities of gait and mobility (R26.89);Muscle weakness (generalized)  (M62.81);Difficulty in walking, not elsewhere classified (R26.2);Hemiplegia and hemiparesis Hemiplegia - Right/Left: Right Hemiplegia - caused by: Cerebral infarction     Time: 1601-0932 PT Time Calculation (min) (ACUTE ONLY): 27 min  Charges:    $Therapeutic Activity: 23-37 mins PT General Charges $$ ACUTE PT VISIT: 1 Visit                     Kathlyn Sacramento, PT, DPT [facility]  Rehabilitation Services Physical Therapist Office: [phone] Website: [email or web address]    Berton Mount 09/21/2023, 3:25 PM

## 2023-09-21 NOTE — Progress Notes (Signed)
STROKE TEAM PROGRESS NOTE   BRIEF HPI [name] is a 47 y.o. 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  Patient was given IM epinephrine on ED arrival with improvement in neurologic status as patient was able to open his eyes and move all of his extremities but remained unable to speak.  Due to observed facial droop, a code stroke was activated.  Presentation felt to be concerning for possible vasodilatory shock/anaphylactic shock due to allergic reaction versus anaphylaxis to immunosuppressive infusion.  Patient also recently admitted 9/9 with findings of an E. coli UTI and poorly controlled hypertension with discharge on 09/06/2023.  SIGNIFICANT HOSPITAL EVENTS MRI brain with scattered small foci of acute subcortical infarction in the left occipital lobe and posterior left frontal lobe.  Faint diffusion signal abnormality along the left parietal lobe may reflect evolving infarct versus seizure related cytotoxic edema.  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  INTERIM HISTORY/SUBJECTIVE Patient is lying comfortably in bed.  His wife is at the bedside.[name]  He continues to have right hemiparesis with significant right arm weakness and mild right leg weakness.  He is speech is improving but remains nonfluent but able to speak sentences and follows commands well.  Carotid ultrasound confirmed left ICA occlusion in the neck OBJECTIVE  CBC    Component Value Date/Time   WBC 11.1 (H) 09/21/2023 1033   RBC 4.42 09/21/2023 1033   HGB 13.7 09/21/2023 1033   HCT 42.3 09/21/2023 1033   PLT 214 09/21/2023 1033   MCV 95.7 09/21/2023 1033   MCH 31.0 09/21/2023 1033   MCHC 32.4  09/21/2023 1033   RDW 16.5 (H) 09/21/2023 1033   LYMPHSABS 2.9 09/18/2023 1358   MONOABS 0.3 09/18/2023 1358   EOSABS 0.1 09/18/2023 1358   BASOSABS 0.0 09/18/2023 1358   BMET    Component Value Date/Time   NA 135 09/21/2023 1033   NA 139 09/26/2021 0000   K 3.5 09/21/2023 1033   CL 100 09/21/2023 1033   CO2 24 09/21/2023 1033   GLUCOSE 106 (H) 09/21/2023 1033   BUN 11 09/21/2023 1033   BUN 21 09/26/2021 0000   CREATININE 1.49 (H) 09/21/2023 1033   CREATININE 1.20 05/30/2014 1415   CALCIUM 8.0 (L) 09/21/2023 1033   CALCIUM 8.4 09/08/2011 0746   EGFR 42.0 09/12/2023 0000   EGFR 42 09/12/2023 0000   GFRNONAA 58 (L) 09/21/2023 1033   IMAGING past 24 hours No results found.  Vitals:   09/21/23 0329 09/21/23 0821 09/21/23 1100 09/21/23 1612  BP: (!) 162/76 (!) 188/78 (!) 170/71 (!) 156/72  Pulse: 89 89 81 80  Resp:  16 16 17   Temp: 99 F (37.2 C) 97.8 F (36.6 C) 98.3 F (36.8 C) 98.9 F (37.2 C)  TempSrc: Oral Axillary Axillary Oral  SpO2: 100% 96% 96% 100%  Weight:       PHYSICAL EXAM General: Frail ill appearing African-American male not in distress Psych: Slow to respond CV: Regular rate and rhythm on monitor Respiratory: Respirations assisted via mechanical ventilation, spontaneous respirations over set vent rate present GI: Abdomen soft and nontender  NEURO:  Mental Status: Patient is awake and interactive.  Speech is hesitant slow but clear.  No dysarthria or aphasia patient follows  commands.    Cranial Nerves:  II: PERRL, small pupils III, IV, VI: Patient fixates and tracks examiner throughout V: Corneals intact VII: Facial symmetry obscured due to ETT VIII: Hearing is intact to voice. IX, X: Cough and gag reflexes are intact XI: Head is grossly midline XII: Does not protrude tongue Motor: Right upper extremity with minimal movement, left upper extremity is purposeful with antigravity movement to command.  Left lower extremity is purposeful, right  lower extremity withdrawals with light application of noxious stimuli.  Right lower extremity is slightly weaker than left.  Bilateral BKA's noted. Tone: is normal and bulk is normal Sensation: Reacts to touch throughout. Coordination: Does not perform Gait: Deferred in acute setting  ASSESSMENT/PLAN  Acute Ischemic Infarct: Watershed appearing scattered small foci of acute subcortical infarction in the left occipital lobe and posterior left frontal lobe Etiology:  watershed in the setting of severe hypotensive event / vasodilatory versus anaphylactic shock with occluded left ICA with reconstitution of the communicating segment CT head no acute intracranial hemorrhage or evidence of acute large vessel territory infarct.  Aspects score is 10.  Stable mild chronic small vessel disease. Repeat CTH: No acute intracranial abnormality.  Previously identified small volume left cerebral infarct not well-visualized by CT. MRI brain/MRA head: scattered small foci of acute subcortical infarction in the left occipital lobe and posterior left frontal lobe.  Faint diffusion signal abnormality along the left parietal lobe may reflect evolving infarct versus seizure related to cytotoxic edema.  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 Carotid Doppler left ICA occlusion  2D Echo ordered, pending  LDL 90 HgbA1c 5.8 VTE prophylaxis - SQ Lovenox No antithrombotic prior to admission, now on aspirin 81 mg daily and clopidogrel 75 mg daily for 3 months and then aspirin alone. Therapy recommendations:  pending Disposition:  pending  Essential Hypertension Home meds: Carvedilol, hydralazine Unstable Blood Pressure Goal: BP less than 220/110  Avoid hypotension   Hypotension during admission Concern for Vasodilatory/anaphylactic shock Presenting systolic blood pressure in the 60s Patient is requiring levo gtt for blood pressure support  Hyperlipidemia Home meds: Rosuvastatin 10 mg,  resumed in hospital LDL 90, goal < 70 Increase rosuvastatin to 20 mg PO daily Continue statin at discharge  Remote tobacco abuse Stopped smoking cigars over one year ago  Remote Substance Abuse Previously reported marijuana use UDS negative on admission   Other Stroke Risk Factors Coronary artery disease  Other Active Problems PAD s/p bilateral BKA AKI on CKD stage IIIa Chronic immunosuppressive therapy due to renal and pancreatic transplant Shock: Concern for vasodilatory/anaphylactic shock Acidosis improving on bicarb drip Continues to require levo for blood pressure support Recent admission for E. coli UTI UA on admission negative  Hospital day # 3  Patient remains with expressive aphasia and right hemiparesis and caregiver ultrasound confirms left ICA occlusion not clear whether this is chronic or acute but regardless patient is not a candidate for revascularization at this time.  Continue dual antiplatelet therapy aspirin and Plavix for 3 months followed by aspirin alone and aggressive risk factor modification.  Physical occupational speech therapy consults.  He will likely need inpatient rehab.  Long discussion with patient and wife at the bedside and answered questions.  Discussed with Dr. Sharl Ma.  Greater than 50% time during this 35-minute visit was spent in counseling and coordination of care and discussion about his stroke and carotid occlusion and need for rehab and answering questions.  Stroke team will sign off.  Kindly  call for questions.    Delia Heady, MD Medical Director [facility] Pager: [phone] 09/21/2023 4:46 PM  To contact Stroke Continuity provider, please refer to [email or web address]. After hours, contact General Neurology

## 2023-09-21 NOTE — Progress Notes (Signed)
[facility]  PROGRESS NOTE  [name] XBJ:[id number] DOB: March 27, 1976 DOA: 09/18/2023 PCP: [name], MD   Brief HPI:   47 year old male with diabetes, hypertension, status post renal and pancreatic transplant on chronic immunosuppressive therapy who presented with altered mental status and hypotension while receiving immunotherapy infusion, likely due to anaphylaxis   Patient was successfully extubated yesterday    Assessment/Plan:    Acute ischemic infarcts - watershed distribution likely secondary to severe sudden hypotension in setting ?anaphylaxis shock with occluded left ICA with reconstitution of the communicating agent -Neurology was consulted -MRI brain showed scattered small foci of acute subcortical infarction in the left occipital lobe and posterior left frontal lobe.  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 -Currently on aspirin 81 mg and Plavix 75 mg daily for 3 weeks, then aspirin alone    Acute respiratory failure - r/t inadequate airway protection in setting anaphylactic reaction and stroke  -Extubated -Speech therapy evaluation done, started on dysphagia 3 diet with thin liquid   ?  Anaphylactic reaction -  Patient was getting his Belatacept infusion and became acutely altered and hypotensive.   Given Benadryl for potential allergic reaction and was sent to [facility] emergency department.   -Patient was given epinephrine here, and patient was subsequently intubated for altered mental status and inability to protect airway.  -Currently on prednisone 5 mg daily -Tryptase level was elevated at 64.2; will repeat to see if it clears to confirm acute anaphylaxis    Erythrocytosis  - suspect r/t hemoconcentration, improved.     Hyponatremia -Resolved  Hypokalemia -Replete  Hypophosphatemia  -Phosphorus is 1.7 -Will replace phosphorus and check phosphorus level in a.m.  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  Baseline Scr ~1.6-1.8 -Follow-up BMP in a.m. -continue home prednisone 5 mg daily -continue home mycophenolate 360 mg twice daily    #PAD status post bilateral BKA Patient has a past medical history of PAD.  Patient status post bilateral BKA.   P:  -cont home Crestor 10 mg daily   Abnormal TSH  - no known hx thyroid disease.  TSH 1.04 06/2023 now 7.941 on 9/23 > 9/24 0.97 T4 1.28  Hypertension -Continue Coreg, hydralazine -Avoid hypotension    Data Reviewed:   CBG:  Recent Labs  Lab 09/20/23 2036 09/21/23 0000 09/21/23 0332 09/21/23 0813 09/21/23 1156  GLUCAP 140* 78 90 115* 124*    SpO2: 96 % O2 Flow Rate (L/min): 2 L/min FiO2 (%): 30 %    Vitals:   09/20/23 2358 09/21/23 0329 09/21/23 0821 09/21/23 1100  BP: (!) 158/68 (!) 162/76 (!) 188/78 (!) 170/71  Pulse: 85 89 89 81  Resp:   16 16  Temp: (!) 97.5 F (36.4 C) 99 F (37.2 C) 97.8 F (36.6 C) 98.3 F (36.8 C)  TempSrc: Oral Oral Axillary Axillary  SpO2: 96% 100% 96% 96%  Weight:          Data Reviewed:  Basic Metabolic Panel: Recent Labs  Lab 09/18/23 1358 09/18/23 1623 09/18/23 1756 09/18/23 1808 09/18/23 2134 09/19/23 0154 09/19/23 0156 09/19/23 0343 09/19/23 0633 09/19/23 1132 09/20/23 0455 09/21/23 1033  NA 134* 137   < >  --    < > 132*   < > 137 132* 136 136 135  K 3.2* 3.2*   < >  --    < >  3.7   < > 3.5 3.6 3.2* 3.1* 3.5  CL 112* 112*  --   --   --  109  --   --  105  --  103 100  CO2 11*  --   --   --   --  14*  --   --  17*  --  25 24  GLUCOSE 183* 175*  --   --   --  241*  --   --  227*  --  118* 106*  BUN 20 27*  --   --   --  25*  --   --  23*  --  16 11  CREATININE 1.96* 2.00*  --   --   --  2.00*  --   --  1.99*  --  1.66* 1.49*  CALCIUM 7.6*  --   --   --   --  7.4*  --   --  7.4*  --  7.6* 8.0*  MG  --   --   --  1.9  --  1.7  --   --  2.7*   --   --  2.0  PHOS  --   --   --   --   --  2.0*  --   --  2.6  --   --  1.7*   < > = values in this interval not displayed.    CBC: Recent Labs  Lab 09/18/23 1358 09/18/23 1623 09/19/23 0203 09/19/23 0343 09/19/23 1132 09/20/23 0455 09/21/23 1033  WBC 6.8  --  23.5*  --   --  14.2* 11.1*  NEUTROABS 3.5  --   --   --   --   --   --   HGB 20.3*   < > 15.6 16.7 16.0 13.3 13.7  HCT 65.0*   < > 47.7 49.0 47.0 41.2 42.3  MCV 98.8  --  94.8  --   --  94.5 95.7  PLT 288  --  248  --   --  228 214   < > = values in this interval not displayed.    LFT Recent Labs  Lab 09/18/23 1358  AST 17  ALT 12  ALKPHOS 80  BILITOT 1.0  PROT 5.4*  ALBUMIN 2.5*     Antibiotics: Anti-infectives (From admission, onward)    Start     Dose/Rate Route Frequency Ordered Stop   09/20/23 1045  sulfamethoxazole-trimethoprim (BACTRIM) 400-80 MG per tablet 1 tablet        1 tablet Oral Every M-W-F 09/20/23 0952     09/20/23 1000  sulfamethoxazole-trimethoprim (BACTRIM) 400-80 MG per tablet 1 tablet  Status:  Discontinued        1 tablet Per Tube Every M-W-F 09/18/23 1705 09/20/23 0952   09/18/23 1700  sulfamethoxazole-trimethoprim (BACTRIM) 400-80 MG per tablet 1 tablet  Status:  Discontinued        1 tablet Oral Every M-W-F 09/18/23 1659 09/18/23 1705        DVT prophylaxis: Lovenox  Code Status: Full code  Family Communication: Discussed with patient's sister at bedside   CONSULTS PCCM, neurology   Subjective   Denies any complaints   Objective    Physical Examination:   General-appears in no acute distress Heart-S1-S2, regular, no murmur auscultated Lungs-clear to auscultation bilaterally, no wheezing or crackles auscultated Abdomen-soft, nontender, no organomegaly Extremities-no edema in the lower extremities Neuro-alert, aphasic  Status is: Inpatient:  [name]   [facility] If 7PM-7AM, please contact night-coverage at  [email or web address], Office  [phone]   09/21/2023, 2:41 PM  LOS: 3 days

## 2023-09-21 NOTE — Plan of Care (Signed)

## 2023-09-21 NOTE — Progress Notes (Signed)
Was informed by the infusion center of patients admission for event during Belatacept infusion.  I was able to find out that he had not had an infusion since June ( is supposed to get monthly )  I spoke to the transplant MD at [facility] -  since has not had Belatacept for so long would feel better if we cover with something in the meantime. .  Continue the prednisone and myfortic but I will also add cyclosporine to his regimen and check a level in a few days.  His kidney allograft seems to be OK but did have hyperglycemia so am concerned about his pancreas allograft-  will check amylase and lipase as they were elevated at his last appt with me   If questions can call or text me or the nephrology on call team   Cecille Aver [phone] pager

## 2023-09-21 NOTE — Progress Notes (Signed)
This is for visitation purposes. No one without password should be able to visit.

## 2023-09-21 NOTE — Progress Notes (Addendum)
Speech Language Pathology Treatment: Dysphagia  Patient Details Name: [name] MRN: [id number] DOB: 14-Sep-1976 Today's Date: 09/21/2023 Time: 4098-1191 SLP Time Calculation (min) (ACUTE ONLY): 12 min  Assessment / Plan / Recommendation Clinical Impression  Pt reports he has had little PO intake since MBS last date, reporting he does not feel hungry. Provided encouragement to increase intake. Observed pt with trials of Dys 3 texture solids and thin liquids with one instance of a delayed cough, although note pt's history of reflux. Pt wears top and bottom dentures, which appear to be loose and may contribute to noted prolonged mastication. Overall, feel that current diet recommendation remains most appropriate. Provided education regarding esophageal precautions. Will continue to follow briefly to assess ability to upgrade diet as clinically indicated.    HPI HPI: [name] is a 47 yo male presenting to ED 9/23 with AMS. Pt found to be aphasic with a facial droop and a code stroke was activated. Intubated 9/23-9/24. MRI Brain with scattered small foci of acute subcortical infarction in L occipital lobe and posterior L frontal lobe and occluded L ICA. Recently admitted 9/9-9/11 with findings of E coli UTI and poorly controlled HTN. PMH includes HTN, T2DM, PAD s/p bilateral BKA, s/p renal and pancreatic transplant on chronic immunosuppressive therapy, GERD      SLP Plan  Continue with current plan of care      Recommendations for follow up therapy are one component of a multi-disciplinary discharge planning process, led by the attending physician.  Recommendations may be updated based on patient status, additional functional criteria and insurance authorization.    Recommendations  Diet recommendations: Dysphagia 3 (mechanical soft);Thin liquid Liquids provided via: Cup;Straw Medication Administration: Crushed with puree Supervision: Staff to assist with self  feeding Compensations: Minimize environmental distractions;Slow rate;Small sips/bites;Follow solids with liquid Postural Changes and/or Swallow Maneuvers: Seated upright 90 degrees;Upright 30-60 min after meal                  Oral care BID   Frequent or constant Supervision/Assistance Dysphagia, oropharyngeal phase (R13.12)     Continue with current plan of care     [name], M.A., CF-SLP Speech Language Pathology, Acute Rehabilitation Services  Secure Chat preferred [phone]   09/21/2023, 1:43 PM

## 2023-09-21 NOTE — Evaluation (Addendum)
Speech Language Pathology Evaluation Patient Details Name: [name] MRN: [id number] DOB: Jan 24, 1976 Today's Date: 09/21/2023 Time: 9629-5284 SLP Time Calculation (min) (ACUTE ONLY): 12 min  Problem List:  Patient Active Problem List   Diagnosis Date Noted   Cerebral ischemic stroke due to global hypoperfusion with watershed infarct [facility]) 09/20/2023   Anaphylactic shock 09/20/2023   Altered mental status 09/20/2023   PSA elevation 09/18/2023   Hypertension secondary to other renal disorders 09/18/2023   Acute metabolic encephalopathy 09/18/2023   UTI (urinary tract infection) 09/04/2023   PAD (peripheral artery disease) (HCC) 09/04/2023   Diabetes mellitus type II, controlled (HCC) 09/04/2023   E. coli UTI (urinary tract infection) 07/20/2023   Abnormal electrocardiogram (ECG) (EKG) 07/20/2023   Chronic pain 07/04/2023   Tobacco dependence 07/04/2023   Marijuana dependence (HCC) 07/04/2023   Screen for colon cancer 01/10/2022   Balanitis 01/10/2022   Encounter for general adult medical examination with abnormal findings 01/10/2022   Screening for prostate cancer 01/10/2022   Abnormal EKG 10/26/2017   Healthcare maintenance 08/26/2016   Impaired mobility and activities of daily living 07/03/2014   Renal transplant, status post 07/19/2013   History of pancreas transplant (HCC) 07/19/2013   Immunosuppressed status (HCC) 06/27/2013   H/O pancreas transplant (HCC) 06/27/2013   Diabetic gastroparesis associated with type 1 diabetes mellitus (HCC) 05/08/2013   S/P bilateral BKA (below knee amputation) (HCC) 08/17/2012   Metabolic bone disease 07/20/2011   ESRD (end stage renal disease) (HCC) 11/17/2009   GERD 03/14/2007   Dyslipidemia, goal LDL below 100 02/22/2007   Major depressive disorder, recurrent episode (HCC) 02/22/2007   POST TRAUMATIC STRESS DISORDER 02/22/2007   IMPOTENCE, 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   Diabetes mellitus without complication [facility])    no since pancreas transplant   Dialysis patient [facility]) 04/18/2012   "[facility]; [place], [place], [place]"   Gastroparesis    Gastropathy    GERD (gastroesophageal reflux disease)    Hypertension    MRSA infection    over 10 years ago per patient. in legs   Past Surgical History:  Past Surgical History:  Procedure Laterality Date   AV FISTULA PLACEMENT  08/2011   left upper arm   BELOW KNEE LEG AMPUTATION  "it's been awhile"   bilaterally   CATARACT EXTRACTION  ~ 2011   right   COMBINED KIDNEY-PANCREAS TRANSPLANT  2014   ESOPHAGOGASTRODUODENOSCOPY N/A 12/30/2016   Procedure: ESOPHAGOGASTRODUODENOSCOPY (EGD);  Surgeon: Jeani Hawking, MD;  Location: [facility] ENDOSCOPY;  Service: Endoscopy;  Laterality: N/A;   OLECRANON BURSECTOMY Right 06/23/2020   Procedure: RIGHT ELBOW EXCISION OLECRANON BURSITIS;  Surgeon: Nadara Mustard, MD;  Location: [facility];  Service: Orthopedics;  Laterality: Right;   HPI:  [name] is a 47 yo male presenting to ED 9/23 with AMS. Pt found to be aphasic with a facial droop and a code stroke was activated. Intubated 9/23-9/24. MRI Brain with scattered small foci of acute subcortical infarction in L occipital lobe and posterior L frontal lobe and occluded L ICA. Recently admitted 9/9-9/11 with findings of E coli UTI and poorly controlled HTN.  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 He has a low quality of voice. He reports new  difficulties with word finding. Pt unable to complete basic calculation (3+5). Pt 80% accurate with yes/no questions with increased difficutly as complexity progressed. Pt 100% with repetition tasks. He is exhbiting difficult with both confrontation and divergent naming, making perseverate errors throughout. Pt only 50% accurate during confrontation naming task with perseverations and semantic paraphasias. When asked to name animals to complete a divergent naming task, pt perseverative on the word "watch" despite verbal cueing and semantic cueing. Pt understandably emotional about cognitive linguistic deficits. Provided education regarding treatment activities and further ST intervention. Will continue to follow to target deficits listed above.    SLP Assessment  SLP Recommendation/Assessment: Patient needs continued Speech Lanaguage Pathology Services SLP Visit Diagnosis: Aphasia (R47.01);Cognitive communication deficit (R41.841)    Recommendations for follow up therapy are one component of a multi-disciplinary discharge planning process, led by the attending physician.  Recommendations may be updated based on patient status, additional functional criteria and insurance authorization.    Follow Up Recommendations  Acute inpatient rehab (3hours/day)    Assistance Recommended at Discharge  Frequent or constant Supervision/Assistance  Functional Status Assessment Patient has had a recent decline in their functional status and demonstrates the ability to make significant improvements in function in a reasonable and predictable amount of time.  Frequency and Duration min 2x/week  2 weeks      SLP Evaluation Cognition  Overall Cognitive Status: Impaired/Different from baseline Arousal/Alertness: Awake/alert Orientation Level: Oriented to place;Oriented to person;Oriented to situation;Disoriented to time Attention: Selective Selective Attention: Appears intact Awareness: Impaired Awareness  Impairment: Emergent impairment Problem Solving: Impaired Problem Solving Impairment: Verbal basic       Comprehension  Auditory Comprehension Overall Auditory Comprehension: Impaired Yes/No Questions: Impaired Complex Questions: 50-74% accurate Commands: Not tested    Expression Expression Primary Mode of Expression: Verbal Verbal Expression Overall Verbal Expression: Impaired Initiation: Impaired Repetition: No impairment Naming: Impairment Confrontation: Impaired Divergent: 0-24% accurate   Oral / Motor  Oral Motor/Sensory Function Overall Oral Motor/Sensory Function: Mild impairment Facial ROM: Reduced right Facial Symmetry: Abnormal symmetry right Facial Strength: Within Functional Limits Motor Speech Overall Motor Speech: Impaired Respiration: Within functional limits Phonation: Hoarse;Low vocal intensity Resonance: Within functional limits Articulation: Within functional limitis Intelligibility: Intelligible            Gwynneth Aliment, M.A., CF-SLP Speech Language Pathology, Acute Rehabilitation Services  Secure Chat preferred [phone]  09/21/2023, 1:59 PM

## 2023-09-22 DIAGNOSIS — T782XXA Anaphylactic shock, unspecified, initial encounter: Secondary | ICD-10-CM | POA: Diagnosis not present

## 2023-09-22 DIAGNOSIS — G9341 Metabolic encephalopathy: Secondary | ICD-10-CM | POA: Diagnosis not present

## 2023-09-22 DIAGNOSIS — I639 Cerebral infarction, unspecified: Secondary | ICD-10-CM | POA: Diagnosis not present

## 2023-09-22 LAB — GLUCOSE, CAPILLARY
Glucose-Capillary: 112 mg/dL — ABNORMAL HIGH (ref 70–99)
Glucose-Capillary: 121 mg/dL — ABNORMAL HIGH (ref 70–99)
Glucose-Capillary: 134 mg/dL — ABNORMAL HIGH (ref 70–99)
Glucose-Capillary: 137 mg/dL — ABNORMAL HIGH (ref 70–99)
Glucose-Capillary: 141 mg/dL — ABNORMAL HIGH (ref 70–99)
Glucose-Capillary: 170 mg/dL — ABNORMAL HIGH (ref 70–99)

## 2023-09-22 LAB — BASIC METABOLIC PANEL
Anion gap: 13 (ref 5–15)
BUN: 14 mg/dL (ref 6–20)
CO2: 24 mmol/L (ref 22–32)
Calcium: 8 mg/dL — ABNORMAL LOW (ref 8.9–10.3)
Chloride: 99 mmol/L (ref 98–111)
Creatinine, Ser: 1.73 mg/dL — ABNORMAL HIGH (ref 0.61–1.24)
GFR, Estimated: 48 mL/min — ABNORMAL LOW (ref >60)
Glucose, Bld: 108 mg/dL — ABNORMAL HIGH (ref 70–99)
Potassium: 3.4 mmol/L — ABNORMAL LOW (ref 3.5–5.1)
Sodium: 136 mmol/L (ref 135–145)

## 2023-09-22 LAB — CBC
HCT: 45.3 % (ref 39.0–52.0)
Hemoglobin: 14.6 g/dL (ref 13.0–17.0)
MCH: 31.7 pg (ref 26.0–34.0)
MCHC: 32.2 g/dL (ref 30.0–36.0)
MCV: 98.5 fL (ref 80.0–100.0)
Platelets: 189 10*3/uL (ref 150–400)
RBC: 4.6 MIL/uL (ref 4.22–5.81)
RDW: 16 % — ABNORMAL HIGH (ref 11.5–15.5)
WBC: 8 10*3/uL (ref 4.0–10.5)
nRBC: 0 % (ref 0.0–0.2)

## 2023-09-22 LAB — TRIGLYCERIDES: Triglycerides: 70 mg/dL (ref <150)

## 2023-09-22 LAB — MAGNESIUM: Magnesium: 1.9 mg/dL (ref 1.7–2.4)

## 2023-09-22 LAB — PHOSPHORUS: Phosphorus: 2.9 mg/dL (ref 2.5–4.6)

## 2023-09-22 NOTE — Progress Notes (Signed)
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 -Currently on aspirin 81 mg and Plavix 75 mg daily for 3 weeks, then aspirin alone    Acute respiratory failure - r/t inadequate airway protection in setting anaphylactic reaction and stroke  -Extubated -Speech therapy evaluation done, started on dysphagia 3 diet with thin liquid   ?  Anaphylactic reaction -  Patient was getting his Belatacept infusion and became acutely altered and hypotensive.   Given Benadryl for potential allergic reaction and was sent to [facility] emergency department.   -Patient was given epinephrine here, and patient was subsequently intubated for altered mental status and inability to protect airway.  -Currently on prednisone 5 mg daily -Tryptase level was elevated at 64.2; will repeat to see if it clears to confirm acute anaphylaxis    Erythrocytosis  - suspect r/t hemoconcentration, improved.     Hyponatremia -Resolved  Hypokalemia -Replete  Hypophosphatemia  -Phosphorus is 1.7 -Will replace phosphorus and check phosphorus level in a.m.  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  Baseline Scr ~1.6-1.8 -Serum creatinine is down to 1.49 -continue home prednisone 5 mg daily -continue home mycophenolate 360 mg twice daily    #PAD status post bilateral BKA Patient has a past medical history of PAD.  Patient status post bilateral BKA.   P:  -cont home Crestor 10 mg daily   Abnormal TSH  - no known hx thyroid disease.  TSH 1.04 06/2023 now 7.941 on 9/23 > 9/24 0.97 T4 1.28  Hypertension -Continue Coreg, hydralazine -Avoid hypotension    Data Reviewed:   CBG:  Recent Labs  Lab 09/21/23 1625 09/21/23 2004 09/21/23 2337 09/22/23 0355 09/22/23 0826  GLUCAP 150* 107* 154* 134* 141*    SpO2: 96 % O2 Flow Rate (L/min): 2 L/min FiO2 (%): 30 %    Vitals:   09/21/23 2007 09/21/23 2341 09/22/23 0359 09/22/23 0828  BP: (!) 169/68 (!) 162/72 137/71 (!) 152/58  Pulse: 82 91 88 83  Resp: 16 16 17 18   Temp: 99.7 F (37.6 C) 100.3 F (37.9 C) 98 F (36.7 C) 100 F (37.8 C)  TempSrc: Oral Oral Oral Oral  SpO2: 96% 95% 100% 96%  Weight:          Data Reviewed:  Basic Metabolic Panel: Recent Labs  Lab 09/18/23 1358 09/18/23 1623 09/18/23 1756 09/18/23 1808 09/18/23 2134 09/19/23 0154 09/19/23 0156 09/19/23 0343 09/19/23 0633 09/19/23 1132 09/20/23 0455 09/21/23 1033  NA 134* 137   < >  --    < > 132*   < > 137 132* 136 136 135  K 3.2* 3.2*   < >  --    < >  3.7   < > 3.5 3.6 3.2* 3.1* 3.5  CL 112* 112*  --   --   --  109  --   --  105  --  103 100  CO2 11*  --   --   --   --  14*  --   --  17*  --  25 24  GLUCOSE 183* 175*  --   --   --  241*  --   --  227*  --  118* 106*  BUN 20 27*  --   --   --  25*  --   --  23*  --  16 11  CREATININE 1.96* 2.00*  --   --   --  2.00*  --   --  1.99*  --  1.66* 1.49*  CALCIUM 7.6*  --   --   --   --  7.4*  --   --  7.4*  --  7.6* 8.0*  MG  --   --   --  1.9  --  1.7  --   --  2.7*   --   --  2.0  PHOS  --   --   --   --   --  2.0*  --   --  2.6  --   --  1.7*   < > = values in this interval not displayed.    CBC: Recent Labs  Lab 09/18/23 1358 09/18/23 1623 09/19/23 0203 09/19/23 0343 09/19/23 1132 09/20/23 0455 09/21/23 1033  WBC 6.8  --  23.5*  --   --  14.2* 11.1*  NEUTROABS 3.5  --   --   --   --   --   --   HGB 20.3*   < > 15.6 16.7 16.0 13.3 13.7  HCT 65.0*   < > 47.7 49.0 47.0 41.2 42.3  MCV 98.8  --  94.8  --   --  94.5 95.7  PLT 288  --  248  --   --  228 214   < > = values in this interval not displayed.    LFT Recent Labs  Lab 09/18/23 1358  AST 17  ALT 12  ALKPHOS 80  BILITOT 1.0  PROT 5.4*  ALBUMIN 2.5*     Antibiotics: Anti-infectives (From admission, onward)    Start     Dose/Rate Route Frequency Ordered Stop   09/20/23 1045  sulfamethoxazole-trimethoprim (BACTRIM) 400-80 MG per tablet 1 tablet        1 tablet Oral Every M-W-F 09/20/23 0952     09/20/23 1000  sulfamethoxazole-trimethoprim (BACTRIM) 400-80 MG per tablet 1 tablet  Status:  Discontinued        1 tablet Per Tube Every M-W-F 09/18/23 1705 09/20/23 0952   09/18/23 1700  sulfamethoxazole-trimethoprim (BACTRIM) 400-80 MG per tablet 1 tablet  Status:  Discontinued        1 tablet Oral Every M-W-F 09/18/23 1659 09/18/23 1705        DVT prophylaxis: Lovenox  Code Status: Full code  Family Communication: Discussed with patient's sister at bedside   CONSULTS PCCM, neurology   Subjective   Denies any complaints.   Objective    Physical Examination:  Appears in no acute distress S1-S2, regular Lungs clear to auscultation bilaterally Abdomen is soft Neuro-alert, right hemiplegia, following commands  Status is: Inpatient:             [place]  [facility] If 7PM-7AM, please contact night-coverage at [email or web address], Office  [phone]   09/22/2023, 10:43 AM  LOS: 4 days

## 2023-09-22 NOTE — Progress Notes (Signed)
Orthopedic Tech Progress Note Patient Details:  [name] Nov 19, 1976 [id number]  Order for bilateral BKA shrinkers called into [facility].  Patient ID: [name], male   DOB: Sep 08, 1976, 47 y.o.   MRN: [id number]  Docia Furl 09/22/2023, 9:55 AM

## 2023-09-22 NOTE — Progress Notes (Signed)
Occupational Therapy Treatment Patient Details Name: [name] MRN: [id number] DOB: 08-29-1976 Today's Date: 09/22/2023   History of present illness Pt is a 47 y.o. male who presented 09/18/23 from infusion center where he was receiving his Belatacept infusion for chronic immunosuppressive therapy and during his infusion he became hypotensive and had full body burning sensation. Pt received IM epinephrine on ED arrival with improvement in symptoms but noted difficulty speaking and facial droop. Code stroke activated. Imaging revealed scattered small foci of acute subcortical infarction in the left occipital lobe and posterior left frontal lobe and occluded left ICA. PMH includes HTN, DMII, PAD s/p bilateral BKA, renal and pancreatic transplant, depression, GERD.   OT comments  Pt progressing gradually towards OT goals. Focused on standing with Mod A x 2 with weight shifting and balance activities, visual scanning to R side, and attention to RUE for ADLs. Wife at bedside has been assisting with pt exercises. Also encouraged R attention tasks and elevation of RUE to prevent shoulder sublux and elevation. No appreciable movement of RUE noted. Based on rehab potential, feel pt remains an excellent rehab candidate.       If plan is discharge home, recommend the following:  Two people to help with walking and/or transfers;Two people to help with bathing/dressing/bathroom;Assistance with cooking/housework;Direct supervision/assist for medications management;Assistance with feeding;Direct supervision/assist for financial management;Assist for transportation;Help with stairs or ramp for entrance;Supervision due to cognitive status   Equipment Recommendations  Other (comment) (TBD)    Recommendations for Other Services Rehab consult    Precautions / Restrictions Precautions Precautions: Fall;Other (comment) Precaution Comments: BP < 220/110 but avoid hypotension, bil BKA hx (prostheses in  room) Restrictions Weight Bearing Restrictions: No       Mobility Bed Mobility               General bed mobility comments: in chair on entry    Transfers Overall transfer level: Needs assistance Equipment used: 2 person hand held assist Transfers: Sit to/from Stand Sit to Stand: Mod assist, +2 physical assistance, +2 safety/equipment, Via lift equipment, From elevated surface           General transfer comment: Mod A x 2 for 2 standing trials from recliner with RUE support and R knee blocking. good strength and use of LUE to push from armrest to stand     Balance Overall balance assessment: Needs assistance Sitting-balance support: Single extremity supported, Feet unsupported Sitting balance-Leahy Scale: Poor     Standing balance support: Single extremity supported, Reliant on assistive device for balance Standing balance-Leahy Scale: Poor                             ADL either performed or assessed with clinical judgement   ADL Overall ADL's : Needs assistance/impaired           Upper Body Bathing Details (indicate cue type and reason): applying lotion to RUE using LUE, able to easily find hand to rub in lotion but required assist to reach to R elbow and bicep area to rub in lotion                                Extremity/Trunk Assessment Upper Extremity Assessment Upper Extremity Assessment: Right hand dominant;RUE deficits/detail RUE Deficits / Details: flaccid, inattention to this UE, questionable sensation of this UE RUE Coordination: decreased fine motor;decreased gross motor  Lower Extremity Assessment Lower Extremity Assessment: Defer to PT evaluation        Vision   Vision Assessment?: Yes;Vision impaired- to be further tested in functional context Additional Comments: R inattention, able to track when cued but requires increased time intermittently   Perception     Praxis      Cognition Arousal:  Alert Behavior During Therapy: Flat affect Overall Cognitive Status: Difficult to assess Area of Impairment: Following commands, Safety/judgement, Problem solving, Attention                   Current Attention Level: Sustained, Selective   Following Commands: Follows one step commands inconsistently, Follows one step commands with increased time Safety/Judgement: Decreased awareness of safety, Decreased awareness of deficits Awareness: Intellectual, Emergent Problem Solving: Slow processing, Difficulty sequencing, Decreased initiation, Requires verbal cues, Requires tactile cues General Comments: decreased awareness of deficits, follows commands fairly consistently. very minimal responses/verbalizations, just "mhm".        Exercises Exercises: Other exercises Other Exercises Other Exercises: weightshifting in standing with attempts to lift B feet w/ R knee blocking Other Exercises: Scanning to R side of room to wife, objects and staff Other Exercises: RUE elbow weightbearing on armrest with support    Shoulder Instructions       General Comments Wife at bedside, very engaged and supportive    Pertinent Vitals/ Pain       Pain Assessment Pain Assessment: No/denies pain  Home Living                                          Prior Functioning/Environment              Frequency  Min 1X/week        Progress Toward Goals  OT Goals(current goals can now be found in the care plan section)  Progress towards OT goals: Progressing toward goals  Acute Rehab OT Goals Patient Stated Goal: regain independence per wife OT Goal Formulation: Patient unable to participate in goal setting Time For Goal Achievement: 10/04/23 Potential to Achieve Goals: Good ADL Goals Pt Will Perform Grooming: with min assist;sitting Pt Will Perform Lower Body Bathing: with mod assist;sitting/lateral leans Pt Will Perform Lower Body Dressing: sitting/lateral leans;with  mod assist Pt Will Transfer to Toilet: stand pivot transfer;with mod assist;bedside commode Pt Will Perform Toileting - Clothing Manipulation and hygiene: with mod assist;sitting/lateral leans;sit to/from stand Additional ADL Goal #1: Patient will be able to visually attend to the R to identify object, locate ADL supplies, or to visually scan environment with only min verbal cues. Additional ADL Goal #2: Patient will be able to follow one step commands at 100% accuracy as a precursor to higher level tasks. Additional ADL Goal #3: Patient will be able to complete bed mobility at mod A as a precursor to OOB activities.  Plan      Co-evaluation    PT/OT/SLP Co-Evaluation/Treatment: Yes Reason for Co-Treatment: Complexity of the patient's impairments (multi-system involvement);Necessary to address cognition/behavior during functional activity;For patient/therapist safety;To address functional/ADL transfers PT goals addressed during session: Mobility/safety with mobility;Balance OT goals addressed during session: ADL's and self-care;Proper use of Adaptive equipment and DME      AM-PAC OT "6 Clicks" Daily Activity     Outcome Measure   Help from another person eating meals?: A Lot Help from another person taking care of personal grooming?: A  Lot Help from another person toileting, which includes using toliet, bedpan, or urinal?: Total Help from another person bathing (including washing, rinsing, drying)?: A Lot Help from another person to put on and taking off regular upper body clothing?: A Lot Help from another person to put on and taking off regular lower body clothing?: Total 6 Click Score: 10    End of Session Equipment Utilized During Treatment: Gait belt  OT Visit Diagnosis: Unsteadiness on feet (R26.81);Other abnormalities of gait and mobility (R26.89);Muscle weakness (generalized) (M62.81);Low vision, both eyes (H54.2);Other symptoms and signs involving the nervous system  (R29.898);Other symptoms and signs involving cognitive function;Cognitive communication deficit (R41.841);Hemiplegia and hemiparesis Symptoms and signs involving cognitive functions: Cerebral infarction Hemiplegia - Right/Left: Right Hemiplegia - dominant/non-dominant: Dominant Hemiplegia - caused by: Cerebral infarction   Activity Tolerance Patient tolerated treatment well   Patient Left in chair;with call bell/phone within reach;with family/visitor present;Other (comment) (chair alarm not in chair under pt)   Nurse Communication Mobility status        Time: 9563-8756 OT Time Calculation (min): 33 min  Charges: OT General Charges $OT Visit: 1 Visit OT Treatments $Therapeutic Activity: 8-22 mins  Bradd Canary, OTR/L Acute Rehab Services Office: [phone]   Lorre Munroe 09/22/2023, 2:42 PM

## 2023-09-22 NOTE — Progress Notes (Signed)
Inpatient Rehab Admissions Coordinator:  ? ?Per therapy recommendations,  patient was screened for CIR candidacy by Devaney Segers, MS, CCC-SLP. At this time, Pt. Appears to be a a potential candidate for CIR. I will place   order for rehab consult per protocol for full assessment. Please contact me any with questions. ? ?Trine Fread, MS, CCC-SLP ?Rehab Admissions Coordinator  ?[phone] (celll) ?[phone] (office) ? ?

## 2023-09-22 NOTE — Progress Notes (Signed)
Inpatient Rehab Admissions Coordinator:    I met with pt. To discuss potential CIR admit. Wife present. They are interested and wife is looking into options to either take time off work or work from home. I will send case to insurance and pursue for admit.   Megan Salon, MS, CCC-SLP Rehab Admissions Coordinator  [phone] (celll) [phone] (office)

## 2023-09-22 NOTE — Plan of Care (Signed)

## 2023-09-22 NOTE — Progress Notes (Signed)
Physical Therapy Treatment Patient Details Name: [name] MRN: [id number] DOB: January 11, 1976 Today's Date: 09/22/2023   History of Present Illness Pt is a 47 y.o. male who presented 09/18/23 from infusion center where he was receiving his Belatacept infusion for chronic immunosuppressive therapy and during his infusion he became hypotensive and had full body burning sensation. Pt received IM epinephrine on ED arrival with improvement in symptoms but noted difficulty speaking and facial droop. Code stroke activated. Imaging revealed scattered small foci of acute subcortical infarction in the left occipital lobe and posterior left frontal lobe and occluded left ICA. PMH includes HTN, DMII, PAD s/p bilateral BKA, renal and pancreatic transplant, depression, GERD.    PT Comments  Tolerated well, eager to stand and work with therapy. Co-tx to maximize efforts and safety. Stood twice with Mod A +2 bil hand support. Efforts on weight shifting, midline orientation, and weight acceptance through RLE (requires knee block at all times.) Tolerates standing approx 3 min at a time. Seated balance, using LUE on arm rest to make good corrections but occasional min assist for balance. Shrinkers were delivered, reviewed use with pt and wife, extension of Rt knee at rest to prevent contracture. Patient will continue to benefit from skilled physical therapy services to further improve independence with functional mobility. Patient will benefit from intensive inpatient follow up therapy, >3 hours/day.   If plan is discharge home, recommend the following: Assist for transportation;Two people to help with walking and/or transfers;Two people to help with bathing/dressing/bathroom;Assistance with cooking/housework;Direct supervision/assist for medications management;Direct supervision/assist for financial management;Help with stairs or ramp for entrance   Can travel by private vehicle        Equipment  Recommendations  Other (comment) (TBD)    Recommendations for Other Services Rehab consult     Precautions / Restrictions Precautions Precautions: Fall;Other (comment) Precaution Comments: BP < 220/110 but avoid hypotension, bil BKA hx (prostheses in room) Restrictions Weight Bearing Restrictions: No     Mobility  Bed Mobility               General bed mobility comments: in recliner    Transfers Overall transfer level: Needs assistance Equipment used: 2 person hand held assist Transfers: Sit to/from Stand Sit to Stand: Mod assist, +2 physical assistance, +2 safety/equipment           General transfer comment: Mod A x 2 for 2 standing trials from recliner with RUE support and R knee blocking. good strength and use of LUE to push from armrest to stand. Leaning towards Rt but with heavy cues can correct, pulling with LUE to midline.    Ambulation/Gait               General Gait Details: Deferred for safety at this time.   Stairs             Wheelchair Mobility     Tilt Bed    Modified Rankin (Stroke Patients Only) Modified Rankin (Stroke Patients Only) Pre-Morbid Rankin Score: No symptoms Modified Rankin: Severe disability     Balance Overall balance assessment: Needs assistance Sitting-balance support: Single extremity supported, Feet unsupported Sitting balance-Leahy Scale: Poor Sitting balance - Comments: Intermittent min assist to correct midline orientation in chair. Improved ability to use LUE to pull and reposition. Postural control: Posterior lean, Right lateral lean Standing balance support: Bilateral upper extremity supported Standing balance-Leahy Scale: Poor Standing balance comment: BIL UE support hand held with Rt knee block  Cognition Arousal: Alert Behavior During Therapy: Flat affect Overall Cognitive Status: Difficult to assess Area of Impairment: Following commands,  Safety/judgement, Problem solving, Attention                   Current Attention Level: Sustained, Selective   Following Commands: Follows one step commands inconsistently, Follows one step commands with increased time Safety/Judgement: Decreased awareness of safety, Decreased awareness of deficits   Problem Solving: Slow processing, Difficulty sequencing, Decreased initiation, Requires verbal cues, Requires tactile cues General Comments: decreased awareness of deficits, follows commands fairly consistently. very minimal responses/verbalizations, just "mhm".        Exercises Other Exercises Other Exercises: weightshifting in standing with attempts to lift B feet w/ R knee blocking. Able to pivot LLE into more adducted neutral position. Other Exercises: Scanning to R side of room to wife, objects and staff Other Exercises: RUE elbow weightbearing on armrest with support Other Exercises: Educated on Shrinker sock use (Pt and wife)    General Comments General comments (skin integrity, edema, etc.): Wife at bedside, very engaged and supportive      Pertinent Vitals/Pain      Home Living                          Prior Function            PT Goals (current goals can now be found in the care plan section) Acute Rehab PT Goals Patient Stated Goal: to improve PT Goal Formulation: With patient/family Time For Goal Achievement: 10/04/23 Potential to Achieve Goals: Good Progress towards PT goals: Progressing toward goals    Frequency    Min 1X/week      PT Plan      Co-evaluation PT/OT/SLP Co-Evaluation/Treatment: Yes Reason for Co-Treatment: Complexity of the patient's impairments (multi-system involvement);Necessary to address cognition/behavior during functional activity;For patient/therapist safety;To address functional/ADL transfers PT goals addressed during session: Mobility/safety with mobility;Balance;Strengthening/ROM OT goals addressed during  session: ADL's and self-care;Proper use of Adaptive equipment and DME      AM-PAC PT "6 Clicks" Mobility   Outcome Measure  Help needed turning from your back to your side while in a flat bed without using bedrails?: A Lot Help needed moving from lying on your back to sitting on the side of a flat bed without using bedrails?: A Lot Help needed moving to and from a bed to a chair (including a wheelchair)?: Total Help needed standing up from a chair using your arms (e.g., wheelchair or bedside chair)?: Total Help needed to walk in hospital room?: Total Help needed climbing 3-5 steps with a railing? : Total 6 Click Score: 8    End of Session Equipment Utilized During Treatment: Gait belt Activity Tolerance: Patient tolerated treatment well Patient left: with call bell/phone within reach;in chair;with chair alarm set;with family/visitor present;Other (comment) (Shrinkers placed bil LEs) Nurse Communication: Mobility status;Need for lift equipment PT Visit Diagnosis: Other symptoms and signs involving the nervous system (R29.898);Unsteadiness on feet (R26.81);Other abnormalities of gait and mobility (R26.89);Muscle weakness (generalized) (M62.81);Difficulty in walking, not elsewhere classified (R26.2);Hemiplegia and hemiparesis Hemiplegia - Right/Left: Right Hemiplegia - dominant/non-dominant: Dominant Hemiplegia - caused by: Cerebral infarction     Time: 1357-1431 PT Time Calculation (min) (ACUTE ONLY): 34 min  Charges:    $Therapeutic Activity: 8-22 mins PT General Charges $$ ACUTE PT VISIT: 1 Visit  Kathlyn Sacramento, PT, DPT [facility]  Rehabilitation Services Physical Therapist Office: [phone] Website: [email or web address]    Berton Mount 09/22/2023, 4:39 PM

## 2023-09-23 ENCOUNTER — Inpatient Hospital Stay (HOSPITAL_COMMUNITY): Payer: 59

## 2023-09-23 DIAGNOSIS — G9341 Metabolic encephalopathy: Secondary | ICD-10-CM | POA: Diagnosis not present

## 2023-09-23 DIAGNOSIS — T782XXA Anaphylactic shock, unspecified, initial encounter: Secondary | ICD-10-CM | POA: Diagnosis not present

## 2023-09-23 DIAGNOSIS — I639 Cerebral infarction, unspecified: Secondary | ICD-10-CM | POA: Diagnosis not present

## 2023-09-23 LAB — BASIC METABOLIC PANEL
Anion gap: 11 (ref 5–15)
BUN: 16 mg/dL (ref 6–20)
CO2: 23 mmol/L (ref 22–32)
Calcium: 8 mg/dL — ABNORMAL LOW (ref 8.9–10.3)
Chloride: 101 mmol/L (ref 98–111)
Creatinine, Ser: 2.37 mg/dL — ABNORMAL HIGH (ref 0.61–1.24)
GFR, Estimated: 33 mL/min — ABNORMAL LOW (ref >60)
Glucose, Bld: 113 mg/dL — ABNORMAL HIGH (ref 70–99)
Potassium: 3 mmol/L — ABNORMAL LOW (ref 3.5–5.1)
Sodium: 135 mmol/L (ref 135–145)

## 2023-09-23 LAB — CBC
HCT: 42.7 % (ref 39.0–52.0)
Hemoglobin: 14 g/dL (ref 13.0–17.0)
MCH: 31.7 pg (ref 26.0–34.0)
MCHC: 32.8 g/dL (ref 30.0–36.0)
MCV: 96.8 fL (ref 80.0–100.0)
Platelets: 201 10*3/uL (ref 150–400)
RBC: 4.41 MIL/uL (ref 4.22–5.81)
RDW: 15.9 % — ABNORMAL HIGH (ref 11.5–15.5)
WBC: 13.4 10*3/uL — ABNORMAL HIGH (ref 4.0–10.5)
nRBC: 0 % (ref 0.0–0.2)

## 2023-09-23 LAB — GLUCOSE, CAPILLARY
Glucose-Capillary: 115 mg/dL — ABNORMAL HIGH (ref 70–99)
Glucose-Capillary: 142 mg/dL — ABNORMAL HIGH (ref 70–99)
Glucose-Capillary: 160 mg/dL — ABNORMAL HIGH (ref 70–99)
Glucose-Capillary: 162 mg/dL — ABNORMAL HIGH (ref 70–99)
Glucose-Capillary: 171 mg/dL — ABNORMAL HIGH (ref 70–99)
Glucose-Capillary: 201 mg/dL — ABNORMAL HIGH (ref 70–99)

## 2023-09-23 LAB — URINALYSIS, ROUTINE W REFLEX MICROSCOPIC
Bilirubin Urine: NEGATIVE
Glucose, UA: NEGATIVE mg/dL
Ketones, ur: NEGATIVE mg/dL
Nitrite: NEGATIVE
Protein, ur: 100 mg/dL — AB
Specific Gravity, Urine: 1.008 (ref 1.005–1.030)
WBC, UA: >50 WBC/hpf (ref 0–5)
pH: 6 (ref 5.0–8.0)

## 2023-09-23 LAB — MAGNESIUM: Magnesium: 1.8 mg/dL (ref 1.7–2.4)

## 2023-09-23 LAB — PHOSPHORUS: Phosphorus: 2.5 mg/dL (ref 2.5–4.6)

## 2023-09-23 LAB — TRYPTASE
Tryptase: 7.7 ug/L (ref 2.2–13.2)
Tryptase: 8.3 ug/L (ref 2.2–13.2)

## 2023-09-23 LAB — LACTIC ACID, PLASMA: Lactic Acid, Venous: 2 mmol/L (ref 0.5–1.9)

## 2023-09-23 MED ORDER — POLYETHYLENE GLYCOL 3350 17 G PO PACK: 17.0000 g | PACK | Freq: Every day | ORAL | Status: DC | PRN

## 2023-09-23 MED ORDER — PROMETHAZINE HCL 12.5 MG PO TABS
12.5000 mg | ORAL_TABLET | Freq: Once | ORAL | Status: AC | PRN
  Administered 2023-09-24: 12.5 mg via ORAL
  Filled 2023-09-23: qty 1

## 2023-09-23 MED ORDER — DEXTROSE-SODIUM CHLORIDE 5-0.9 % IV SOLN: INTRAVENOUS | Status: AC

## 2023-09-23 MED ORDER — VANCOMYCIN HCL IN DEXTROSE 1-5 GM/200ML-% IV SOLN
1000.0000 mg | INTRAVENOUS | Status: AC
  Administered 2023-09-24: 1000 mg via INTRAVENOUS
  Filled 2023-09-23: qty 200

## 2023-09-23 MED ORDER — PIPERACILLIN-TAZOBACTAM 3.375 G IVPB
3.3750 g | Freq: Three times a day (TID) | INTRAVENOUS | Status: DC
  Administered 2023-09-23 – 2023-09-26 (×10): 3.375 g via INTRAVENOUS
  Filled 2023-09-23 (×11): qty 50

## 2023-09-23 MED ORDER — ONDANSETRON HCL 4 MG PO TABS
4.0000 mg | ORAL_TABLET | Freq: Two times a day (BID) | ORAL | Status: DC | PRN
  Administered 2023-09-23: 4 mg via ORAL
  Filled 2023-09-23: qty 1

## 2023-09-23 MED ORDER — TERAZOSIN HCL 1 MG PO CAPS
2.0000 mg | ORAL_CAPSULE | Freq: Every day | ORAL | Status: DC
  Administered 2023-09-23 – 2023-09-28 (×5): 2 mg via ORAL
  Filled 2023-09-23 (×6): qty 2

## 2023-09-23 MED ORDER — ACETAMINOPHEN 325 MG PO TABS
650.0000 mg | ORAL_TABLET | ORAL | Status: DC | PRN
  Administered 2023-09-23 – 2023-09-25 (×3): 650 mg via ORAL
  Filled 2023-09-23 (×2): qty 2

## 2023-09-23 MED ORDER — POTASSIUM CHLORIDE 20 MEQ PO PACK
40.0000 meq | PACK | Freq: Two times a day (BID) | ORAL | Status: AC
  Administered 2023-09-23 (×2): 40 meq via ORAL
  Filled 2023-09-23 (×2): qty 2

## 2023-09-23 MED ORDER — VANCOMYCIN HCL 1500 MG/300ML IV SOLN
1500.0000 mg | Freq: Once | INTRAVENOUS | Status: AC
  Administered 2023-09-23: 1500 mg via INTRAVENOUS
  Filled 2023-09-23: qty 300

## 2023-09-23 NOTE — Progress Notes (Addendum)
Pharmacy Antibiotic Note  [name] is a 47 y.o. male admitted on 09/18/2023 with sepsis.  Pharmacy has been consulted for vancomycin and zosyn dosing. 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 Goal trough 15-20 Zosyn 3.375g IV q8h (4 hour infusion). Monitor CBC, blood cultures, renal function, and signs of clinical improvement Obtain vancomycin levels as clinically indicated per pharmacy protocol  Weight: 83.5 kg (184 lb 1.4 oz)  Temp (24hrs), Avg:99.9 F (37.7 C), Min:98.4 F (36.9 C), Max:102.8 F (39.3 C)  Recent Labs  Lab 09/19/23 0203 09/19/23 0633 09/20/23 0455 09/21/23 1033 09/22/23 1559 09/23/23 0733 09/23/23 1051  WBC 23.5*  --  14.2* 11.1* 8.0 13.4*  --   CREATININE  --  1.99* 1.66* 1.49* 1.73* 2.37*  --   LATICACIDVEN  --   --   --   --   --   --  2.0*    Estimated Creatinine Clearance: 39.4 mL/min (A) (by C-G formula based on SCr of 2.37 mg/dL (H)).    Allergies  Allergen Reactions   Amlodipine Rash   Lisinopril Rash    Antimicrobials this admission: 9/28 vancomycin>>  9/28 zosyn>>    Prophylactic Bactrim SS MWF  Microbiology results: 9/28 BCx: sent 9/24 MRSA PCR: negative  Thank you for allowing pharmacy to be a part of this patient's care.  Stephenie Acres, PharmD PGY1 Pharmacy Resident 09/23/2023 11:51 AM

## 2023-09-23 NOTE — Plan of Care (Signed)

## 2023-09-23 NOTE — Progress Notes (Addendum)
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 -Currently on aspirin 81 mg and Plavix 75 mg daily for 3 weeks, then aspirin alone  Fever -Developed fever 102 F, in setting of immunosuppression for renal and pancreas transplant -Vitals are stable -Will obtain blood cultures, urinalysis, urine culture, obtain chest x-ray, lactic acid -Will start empiric antibiotics with vancomycin and Zosyn -Follow culture results as above  Acute respiratory failure - r/t inadequate airway protection in setting anaphylactic reaction and stroke  -Extubated -Speech therapy evaluation done, started on dysphagia 3 diet with thin liquid   ?  Anaphylactic reaction -  Patient was getting his Belatacept infusion and became acutely altered and hypotensive.   Given Benadryl for potential allergic reaction and was sent to [facility] emergency department.   -Patient was given epinephrine here, and patient was subsequently intubated for altered mental status and inability to protect airway.  -Currently on prednisone 5 mg daily -Tryptase level was elevated at 64.2; will repeat to see if it  clears to confirm acute anaphylaxis  Erythrocytosis  - suspect r/t hemoconcentration, improved.    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  Baseline Scr ~1.6-1.8 -Serum creatinine is down to 1.49 -continue home prednisone 5 mg daily -continue home mycophenolate 360 mg twice daily    #PAD status post bilateral BKA Patient has a past medical history of PAD.  Patient status post bilateral BKA.   P:  -cont home Crestor 10 mg daily   Abnormal TSH  - no known hx thyroid disease.  TSH 1.04 06/2023 now 7.941 on 9/23 > 9/24 0.97 T4 1.28  Hypertension -Continue Coreg, hydralazine -Avoid hypotension  Hypokalemia -Potassium 3.0 -Replace potassium with K-Dur 40 mEq p.o. x 2 -Follow serum potassium in a.m.    Data Reviewed:   CBG:  Recent Labs  Lab 09/22/23 1614 09/22/23 2015 09/22/23 2333 09/23/23 0329 09/23/23 0756  GLUCAP 112* 137* 121* 160* 115*    SpO2: 94 % O2 Flow Rate (L/min): 2 L/min FiO2 (%): 30 %    Vitals:   09/22/23 2332 09/23/23 0329 09/23/23 0439 09/23/23 0757  BP: 119/60 (!) 125/57  122/63  Pulse: 78 92  88  Resp: 18 18  19   Temp: 100 F (37.8 C) 99.7 F (37.6 C)  99.3 F (37.4 C)  TempSrc: Oral Oral  Oral  SpO2: 100% 92%  94%  Weight:   83.5 kg       Data Reviewed:  Basic Metabolic Panel: Recent Labs  Lab 09/19/23 0154 09/19/23 0156 09/19/23 8119 09/19/23 1132 09/20/23 0455 09/21/23 1033 09/22/23 1559 09/23/23 0733  NA 132*   < >  132* 136 136 135 136 135  K 3.7   < > 3.6 3.2* 3.1* 3.5 3.4* 3.0*  CL 109  --  105  --  103 100 99 101  CO2 14*  --  17*  --  25 24 24 23   GLUCOSE 241*  --  227*  --  118* 106* 108* 113*  BUN 25*  --  23*  --  16 11 14 16   CREATININE 2.00*  --  1.99*  --  1.66* 1.49* 1.73* 2.37*  CALCIUM 7.4*  --  7.4*   --  7.6* 8.0* 8.0* 8.0*  MG 1.7  --  2.7*  --   --  2.0 1.9 1.8  PHOS 2.0*  --  2.6  --   --  1.7* 2.9 2.5   < > = values in this interval not displayed.    CBC: Recent Labs  Lab 09/18/23 1358 09/18/23 1623 09/19/23 0203 09/19/23 0343 09/19/23 1132 09/20/23 0455 09/21/23 1033 09/22/23 1559 09/23/23 0733  WBC 6.8  --  23.5*  --   --  14.2* 11.1* 8.0 13.4*  NEUTROABS 3.5  --   --   --   --   --   --   --   --   HGB 20.3*   < > 15.6   < > 16.0 13.3 13.7 14.6 14.0  HCT 65.0*   < > 47.7   < > 47.0 41.2 42.3 45.3 42.7  MCV 98.8  --  94.8  --   --  94.5 95.7 98.5 96.8  PLT 288  --  248  --   --  228 214 189 201   < > = values in this interval not displayed.    LFT Recent Labs  Lab 09/18/23 1358  AST 17  ALT 12  ALKPHOS 80  BILITOT 1.0  PROT 5.4*  ALBUMIN 2.5*     Antibiotics: Anti-infectives (From admission, onward)    Start     Dose/Rate Route Frequency Ordered Stop   09/20/23 1045  sulfamethoxazole-trimethoprim (BACTRIM) 400-80 MG per tablet 1 tablet        1 tablet Oral Every M-W-F 09/20/23 0952     09/20/23 1000  sulfamethoxazole-trimethoprim (BACTRIM) 400-80 MG per tablet 1 tablet  Status:  Discontinued        1 tablet Per Tube Every M-W-F 09/18/23 1705 09/20/23 0952   09/18/23 1700  sulfamethoxazole-trimethoprim (BACTRIM) 400-80 MG per tablet 1 tablet  Status:  Discontinued        1 tablet Oral Every M-W-F 09/18/23 1659 09/18/23 1705        DVT prophylaxis: Lovenox  Code Status: Full code  Family Communication: Discussed with patient's wife at bedside   CONSULTS PCCM, neurology   Subjective   Developed fever this morning, Tmax 102 F.  Has been lethargic  Objective    Physical Examination:  Alert, lethargic S1-S2, regular Lungs clear to auscultation bilaterally Abdomen is soft, nontender  Status is: Inpatient:          [name]   [facility] If 7PM-7AM, please contact night-coverage at [email or web address], Office   [phone]   09/23/2023, 9:41 AM  LOS: 5 days

## 2023-09-23 NOTE — Plan of Care (Signed)
  Problem: Skin Integrity: Goal: Risk for impaired skin integrity will decrease Outcome: Progressing   Problem: Activity: Goal: Risk for activity intolerance will decrease Outcome: Progressing   Problem: Nutrition: Goal: Adequate nutrition will be maintained Outcome: Progressing   Problem: Safety: Goal: Ability to remain free from injury will improve Outcome: Progressing

## 2023-09-24 DIAGNOSIS — T782XXA Anaphylactic shock, unspecified, initial encounter: Secondary | ICD-10-CM | POA: Diagnosis not present

## 2023-09-24 DIAGNOSIS — G9341 Metabolic encephalopathy: Secondary | ICD-10-CM | POA: Diagnosis not present

## 2023-09-24 DIAGNOSIS — I639 Cerebral infarction, unspecified: Secondary | ICD-10-CM | POA: Diagnosis not present

## 2023-09-24 LAB — BLOOD CULTURE ID PANEL (REFLEXED) - BCID2

## 2023-09-24 LAB — CBC
HCT: 37 % — ABNORMAL LOW (ref 39.0–52.0)
Hemoglobin: 12 g/dL — ABNORMAL LOW (ref 13.0–17.0)
MCH: 30.6 pg (ref 26.0–34.0)
MCHC: 32.4 g/dL (ref 30.0–36.0)
MCV: 94.4 fL (ref 80.0–100.0)
Platelets: 201 10*3/uL (ref 150–400)
RBC: 3.92 MIL/uL — ABNORMAL LOW (ref 4.22–5.81)
RDW: 16 % — ABNORMAL HIGH (ref 11.5–15.5)
WBC: 13.9 10*3/uL — ABNORMAL HIGH (ref 4.0–10.5)
nRBC: 0 % (ref 0.0–0.2)

## 2023-09-24 LAB — COMPREHENSIVE METABOLIC PANEL
ALT: 12 U/L (ref 0–44)
AST: 21 U/L (ref 15–41)
Albumin: 2 g/dL — ABNORMAL LOW (ref 3.5–5.0)
Alkaline Phosphatase: 62 U/L (ref 38–126)
Anion gap: 16 — ABNORMAL HIGH (ref 5–15)
BUN: 21 mg/dL — ABNORMAL HIGH (ref 6–20)
CO2: 20 mmol/L — ABNORMAL LOW (ref 22–32)
Calcium: 8 mg/dL — ABNORMAL LOW (ref 8.9–10.3)
Chloride: 97 mmol/L — ABNORMAL LOW (ref 98–111)
Creatinine, Ser: 2.93 mg/dL — ABNORMAL HIGH (ref 0.61–1.24)
GFR, Estimated: 26 mL/min — ABNORMAL LOW (ref >60)
Glucose, Bld: 160 mg/dL — ABNORMAL HIGH (ref 70–99)
Potassium: 3.5 mmol/L (ref 3.5–5.1)
Sodium: 133 mmol/L — ABNORMAL LOW (ref 135–145)
Total Bilirubin: 0.7 mg/dL (ref 0.3–1.2)
Total Protein: 5.5 g/dL — ABNORMAL LOW (ref 6.5–8.1)

## 2023-09-24 LAB — GLUCOSE, CAPILLARY
Glucose-Capillary: 129 mg/dL — ABNORMAL HIGH (ref 70–99)
Glucose-Capillary: 139 mg/dL — ABNORMAL HIGH (ref 70–99)
Glucose-Capillary: 162 mg/dL — ABNORMAL HIGH (ref 70–99)
Glucose-Capillary: 186 mg/dL — ABNORMAL HIGH (ref 70–99)
Glucose-Capillary: 196 mg/dL — ABNORMAL HIGH (ref 70–99)
Glucose-Capillary: 262 mg/dL — ABNORMAL HIGH (ref 70–99)

## 2023-09-24 LAB — PHOSPHORUS: Phosphorus: 2.7 mg/dL (ref 2.5–4.6)

## 2023-09-24 LAB — MAGNESIUM: Magnesium: 1.9 mg/dL (ref 1.7–2.4)

## 2023-09-24 MED ORDER — METOCLOPRAMIDE HCL 10 MG PO TABS: 10.0000 mg | ORAL_TABLET | Freq: Three times a day (TID) | ORAL | Status: DC

## 2023-09-24 MED ORDER — PANTOPRAZOLE SODIUM 40 MG PO TBEC
40.0000 mg | DELAYED_RELEASE_TABLET | Freq: Two times a day (BID) | ORAL | Status: DC
  Administered 2023-09-24 – 2023-09-29 (×10): 40 mg via ORAL
  Filled 2023-09-24 (×10): qty 1

## 2023-09-24 MED ORDER — PANTOPRAZOLE SODIUM 40 MG PO TBEC: 40.0000 mg | DELAYED_RELEASE_TABLET | Freq: Every day | ORAL | Status: DC

## 2023-09-24 MED ORDER — LACTATED RINGERS IV SOLN: INTRAVENOUS | Status: AC

## 2023-09-24 MED ORDER — METOCLOPRAMIDE HCL 10 MG PO TABS
10.0000 mg | ORAL_TABLET | Freq: Three times a day (TID) | ORAL | Status: DC
  Administered 2023-09-24 – 2023-09-29 (×21): 10 mg via ORAL
  Filled 2023-09-24 (×21): qty 1

## 2023-09-24 NOTE — Progress Notes (Signed)
PHARMACY - PHYSICIAN COMMUNICATION CRITICAL VALUE ALERT - BLOOD CULTURE IDENTIFICATION (BCID)  [name] is an 47 y.o. male who presented to [facility] on 09/18/2023 admitted with sepsis. 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 3/4 Bcx growing E. Coli with no gene resistance. Source TBD. Urine culture pending. WBC 13.4, Tmax 102.8 on 9/28.  Name of physician (or Provider) Contacted: Dr. Loney Loh  Current antibiotics: Zosyn, vancomycin, bactrim MWF   Changes to prescribed antibiotics recommended:   -Covered with zosyn, can de-escalate as needed  Results for orders placed or performed during the hospital encounter of 09/18/23  Blood Culture ID Panel (Reflexed) (Collected: 09/23/2023 10:51 AM)  Result Value Ref Range   Enterococcus faecalis NOT DETECTED NOT DETECTED   Enterococcus Faecium NOT DETECTED NOT DETECTED   Listeria monocytogenes NOT DETECTED NOT DETECTED   Staphylococcus species NOT DETECTED NOT DETECTED   Staphylococcus aureus (BCID) NOT DETECTED NOT DETECTED   Staphylococcus epidermidis NOT DETECTED NOT DETECTED   Staphylococcus lugdunensis NOT DETECTED NOT DETECTED   Streptococcus species NOT DETECTED NOT DETECTED   Streptococcus agalactiae NOT DETECTED NOT DETECTED   Streptococcus pneumoniae NOT DETECTED NOT DETECTED   Streptococcus pyogenes NOT DETECTED NOT DETECTED   A.calcoaceticus-baumannii NOT DETECTED NOT DETECTED   Bacteroides fragilis NOT DETECTED NOT DETECTED   Enterobacterales DETECTED (A) NOT DETECTED   Enterobacter cloacae complex NOT DETECTED NOT DETECTED   Escherichia coli DETECTED (A) NOT DETECTED   Klebsiella aerogenes NOT DETECTED NOT DETECTED   Klebsiella oxytoca NOT DETECTED NOT DETECTED   Klebsiella pneumoniae NOT DETECTED NOT DETECTED   Proteus species NOT DETECTED NOT DETECTED   Salmonella species NOT DETECTED NOT DETECTED   Serratia marcescens NOT DETECTED NOT DETECTED    Haemophilus influenzae NOT DETECTED NOT DETECTED   Neisseria meningitidis NOT DETECTED NOT DETECTED   Pseudomonas aeruginosa NOT DETECTED NOT DETECTED   Stenotrophomonas maltophilia NOT DETECTED NOT DETECTED   Candida albicans NOT DETECTED NOT DETECTED   Candida auris NOT DETECTED NOT DETECTED   Candida glabrata NOT DETECTED NOT DETECTED   Candida krusei NOT DETECTED NOT DETECTED   Candida parapsilosis NOT DETECTED NOT DETECTED   Candida tropicalis NOT DETECTED NOT DETECTED   Cryptococcus neoformans/gattii NOT DETECTED NOT DETECTED   CTX-M ESBL NOT DETECTED NOT DETECTED   Carbapenem resistance IMP NOT DETECTED NOT DETECTED   Carbapenem resistance KPC NOT DETECTED NOT DETECTED   Carbapenem resistance NDM NOT DETECTED NOT DETECTED   Carbapenem resist OXA 48 LIKE NOT DETECTED NOT DETECTED   Carbapenem resistance VIM NOT DETECTED NOT DETECTED    Arabella Merles, PharmD. Clinical Pharmacist 09/24/2023 5:49 AM

## 2023-09-24 NOTE — PMR Pre-admission (Signed)
PMR Admission Coordinator Pre-Admission Assessment  Patient: [name] is an 47 y.o., male MRN: [id number] DOB: 1976-12-19 Height:   Weight: 83.2 kg  Insurance Information HMO:     PPO:      PCP:      IPA:      80/20:      OTHER:  PRIMARY: UHC Medicare       Policy#: [id number], Medicare: [id number]      Subscriber: Pt CM Name:       Phone#:[phone]      Fax#: [phone] Pre-Cert#: [id number]       Employer:  Benefits:  Phone #:      Name:  Dolores Hoose Date: 12/26/2022 - still active Deductible: $240 ($240 met) OOP Max: $8,850 ($6,440.34 met) CIR: $1,730 copay/ admission SNF: $0.00 Copayment per day for days 1-20; $204 Copayment per day for days 21-100; Maximum of 100 days/benefit period Outpatient: 80% coverage; 20% co-insurance Home Health:  100% coverage, limited by medical necessity DME: 80% coverage; 20% co-insurance Providers: in network  SECONDARY: Medicaid       Policy#: [id number]      Phone#:   Financial Counselor:       Phone#:   The "Data Collection Information Summary" for patients in Inpatient Rehabilitation Facilities with attached "Privacy Act Statement-Health Care Records" was provided and verbally reviewed with: Patient and Family  Emergency Contact Information Contact Information     Name Relation Home Work [place] Spouse [phone]  [phone]      Other Contacts     Name Relation Home Work Mobile   Milton,Sharon Aunt   [phone]       Current Medical History  Patient Admitting Diagnosis: CVA History of Present Illness: [name] is a 47 year old male with a past medical history of hypertension, type 2 diabetes, PAD status post 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  Patient was at his infusion center  getting his  Belatacept infusion for chronic immunosuppressive therapy  they day of admit and during his infusion he became hypotensive and had full body burning sensation.  Patient did receive Benadryl at the infusion center and patient was transported to the [facility] for further evaluation and management.  On initial arrival to the emergency department patient was altered, and had blood pressure of 99/86.  Patient was altered, and minimally responsive.  Patient was given IM epinephrine with mild improvement in mental status.  Patient was mumbling and nonsensical, and therefore with concern for not protecting his own airway, patient was emergently intubated.  Patient subsequently had code stroke called. Felt Pt. Also may be in anaphalaxis shock.  Patient was admitted to the ICU. [name] Imaging revealed scattered small foci of acute subcortical infarction in the left occipital lobe and posterior left frontal lobe and occluded left ICA. Pt. Extubated 09/20/23. Developed fever 102 F, in setting of immunosuppression for renal and pancreas transplant on 09/22/23. Blood culture grew gram-negative rods, ID showed E. Coli. Recommend continue with Zosyn. Vancomycin was discontinued.  Pt. Seen by PT/OT and they recommend CIR to assist return to PLOF.    Complete NIHSS TOTAL: 10  Patient's medical record from [facility] has been reviewed by the rehabilitation admission coordinator and physician.  Past Medical History  Past Medical History:  Diagnosis Date   AMPUTATION, BELOW KNEE, HX OF 04/08/2008  Arthritis    "I think I do; just in my fingers & my hands"   Blood transfusion    Cataract    Chronic pain    Depression    Patient states he has never been depressed.   Diabetes mellitus without complication [facility])    no since pancreas transplant   Dialysis patient [facility]) 04/18/2012   "[facility]; [place], [place], [place]"   Gastroparesis    Gastropathy    GERD (gastroesophageal reflux  disease)    Hypertension    MRSA infection    over 10 years ago per patient. in legs    Has the patient had major surgery during 100 days prior to admission? No  Family History   family history includes Diabetes in his maternal grandmother, mother, paternal grandmother, and another family member; Hypertension in his mother and another family member; Kidney disease in his mother; Lung cancer in his maternal aunt.  Current Medications  Current Facility-Administered Medications:    Place/Maintain arterial line, , , Until Discontinued **AND** 0.9 %  sodium chloride infusion, , Intra-arterial, PRN, Paliwal, Aditya, MD   acetaminophen (TYLENOL) tablet 650 mg, 650 mg, Oral, Q4H PRN, Sharl Ma, Gagan S, MD, 650 mg at 09/23/23 1130   aspirin EC tablet 81 mg, 81 mg, Oral, Daily, Pearlean Brownie, Pramod S, MD, 81 mg at 09/23/23 1018   carvedilol (COREG) tablet 6.25 mg, 6.25 mg, Oral, BID WC, Duayne Cal, NP, 6.25 mg at 09/23/23 1653   Chlorhexidine Gluconate Cloth 2 % PADS 6 each, 6 each, Topical, Q0600, Lynnell Catalan, MD, 6 each at 09/23/23 0611   clopidogrel (PLAVIX) tablet 75 mg, 75 mg, Oral, Daily, Pearlean Brownie, Pramod S, MD, 75 mg at 09/23/23 1018   cycloSPORINE modified (GENGRAF) capsule 50 mg, 50 mg, Oral, BID, Annie Sable, MD, 50 mg at 09/23/23 2214   enoxaparin (LOVENOX) injection 40 mg, 40 mg, Subcutaneous, Daily, Whiteheart, Kathryn A, NP, 40 mg at 09/23/23 1018   hydrALAZINE (APRESOLINE) tablet 50 mg, 50 mg, Oral, BID, Chand, Sudham, MD, 50 mg at 09/23/23 2215   insulin aspart (novoLOG) injection 0-15 Units, 0-15 Units, Subcutaneous, Q4H, Whiteheart, Kathryn A, NP, 2 Units at 09/23/23 1638   metoCLOPramide (REGLAN) tablet 10 mg, 10 mg, Oral, TID AC & HS, Sharl Ma, Sarina Ill, MD, 10 mg at 09/24/23 1053   mycophenolate (MYFORTIC) EC tablet 360 mg, 360 mg, Oral, BID, Chand, Sudham, MD, 360 mg at 09/23/23 2214   Oral care mouth rinse, 15 mL, Mouth Rinse, PRN, Agarwala, Ravi, MD   Oral care mouth rinse, 15  mL, Mouth Rinse, PRN, Cheri Fowler, MD   Oral care mouth rinse, 15 mL, Mouth Rinse, PRN, Briant Sites, DO   piperacillin-tazobactam (ZOSYN) IVPB 3.375 g, 3.375 g, Intravenous, Q8H, Jacklynn Barnacle, RPH, Last Rate: 12.5 mL/hr at 09/24/23 0616, 3.375 g at 09/24/23 0616   polyethylene glycol (MIRALAX / GLYCOLAX) packet 17 g, 17 g, Oral, Daily PRN, Sharl Ma, Gagan S, MD   predniSONE (DELTASONE) tablet 5 mg, 5 mg, Oral, Daily, Knute Neu, RPH, 5 mg at 09/23/23 1017   rosuvastatin (CRESTOR) tablet 20 mg, 20 mg, Oral, Daily, Knute Neu, RPH, 20 mg at 09/23/23 1017   sulfamethoxazole-trimethoprim (BACTRIM) 400-80 MG per tablet 1 tablet, 1 tablet, Oral, Q M,W,F, Knute Neu, RPH, 1 tablet at 09/22/23 0847   terazosin (HYTRIN) capsule 2 mg, 2 mg, Oral, QHS, Sharl Ma, Sarina Ill, MD, 2 mg at 09/23/23 2215   vancomycin (VANCOCIN) IVPB 1000 mg/200 mL premix,  1,000 mg, Intravenous, Q24H, Jacklynn Barnacle, [place]  Patients Current Diet:  Diet Order             DIET DYS 3 Room service appropriate? Yes with Assist; Fluid consistency: Thin  Diet effective now                   Precautions / Restrictions Precautions Precautions: Fall, Other (comment) Precaution Comments: BP < 220/110 but avoid hypotension, bil BKA hx (prostheses in room) Restrictions Weight Bearing Restrictions: No   Has the patient had 2 or more falls or a fall with injury in the past year? No  Prior Activity Level Community (5-7x/wk): Pt. active in the communiyt PTA  Prior Functional Level Self Care: Did the patient need help bathing, dressing, using the toilet or eating? Independent  Indoor Mobility: Did the patient need assistance with walking from room to room (with or without device)? Independent  Stairs: Did the patient need assistance with internal or external stairs (with or without device)? Independent  Functional Cognition: Did the patient need help planning regular tasks such as shopping or remembering to take  medications? Independent  Patient Information Are you of Hispanic, Latino/a,or Spanish origin?: A. No, not of Hispanic, Latino/a, or Spanish origin What is your race?: B. Black or African American Do you need or want an interpreter to communicate with a doctor or health care staff?: 0. No  Patient's Response To:  Health Literacy and Transportation Is the patient able to respond to health literacy and transportation needs?: Yes Health Literacy - How often do you need to have someone help you when you read instructions, pamphlets, or other written material from your doctor or pharmacy?: Never In the past 12 months, has lack of transportation kept you from medical appointments or from getting medications?: No In the past 12 months, has lack of transportation kept you from meetings, work, or from getting things needed for daily living?: No  Home Assistive Devices / Equipment Home Equipment: Hand held shower head  Prior Device Use: Indicate devices/aids used by the patient prior to current illness, exacerbation or injury? None of the above  Current Functional Level Cognition  Arousal/Alertness: Awake/alert Overall Cognitive Status: Difficult to assess Difficult to assess due to: Impaired communication Current Attention Level: Sustained, Selective Orientation Level: Oriented to person, Disoriented to place, Disoriented to time, Disoriented to situation Following Commands: Follows one step commands inconsistently, Follows one step commands with increased time Safety/Judgement: Decreased awareness of safety, Decreased awareness of deficits General Comments: decreased awareness of deficits, follows commands fairly consistently. very minimal responses/verbalizations, just "mhm". Attention: Selective Selective Attention: Appears intact Awareness: Impaired Awareness Impairment: Emergent impairment Problem Solving: Impaired Problem Solving Impairment: Verbal basic    Extremity  Assessment (includes Sensation/Coordination)  Upper Extremity Assessment: Right hand dominant, RUE deficits/detail RUE Deficits / Details: flaccid, inattention to this UE, questionable sensation of this UE RUE Sensation: decreased proprioception RUE Coordination: decreased fine motor, decreased gross motor  Lower Extremity Assessment: Defer to PT evaluation RLE Deficits / Details: hx of BKA; R leg weaker than L with gross MMT scores of 2+ to 3-, inconsistent supine vs sitting; unsure of extent of sensation present in leg or not as pt perseverated on "fingertips" when asked where he was touched LLE Deficits / Details: hx of BKA, MMT scores grossly >/= 4    ADLs  Overall ADL's : Needs assistance/impaired Eating/Feeding: NPO Grooming: Moderate assistance, Sitting Upper Body Bathing: Moderate assistance, Sitting Upper Body Bathing Details (indicate  cue type and reason): applying lotion to RUE using LUE, able to easily find hand to rub in lotion but required assist to reach to R elbow and bicep area to rub in lotion Lower Body Bathing: Maximal assistance, Total assistance, Sitting/lateral leans, Sit to/from stand Upper Body Dressing : Moderate assistance, Sitting Lower Body Dressing: Maximal assistance, Total assistance, Sit to/from stand, Sitting/lateral leans Toilet Transfer Details (indicate cue type and reason): did not assess to date Toileting- Clothing Manipulation and Hygiene: Total assistance, Bed level Functional mobility during ADLs: Maximal assistance, +2 for safety/equipment, +2 for physical assistance, Cueing for safety, Cueing for sequencing General ADL Comments: Patient presenting with decreased attention/neglect to R side, cogntive impairments, decreased visual tracking, no activation in RUE, and minimal activation in RLE. Patient mod A to total A for ADL management.    Mobility  Overal bed mobility: Needs Assistance Bed Mobility: Supine to Sit Rolling: Mod assist Supine to  sit: Min assist Sit to supine: Mod assist, +2 for physical assistance, +2 for safety/equipment, HOB elevated General bed mobility comments: in recliner    Transfers  Overall transfer level: Needs assistance Equipment used: 2 person hand held assist Transfers: Sit to/from Stand Sit to Stand: Mod assist, +2 physical assistance, +2 safety/equipment General transfer comment: Mod A x 2 for 2 standing trials from recliner with RUE support and R knee blocking. good strength and use of LUE to push from armrest to stand. Leaning towards Rt but with heavy cues can correct, pulling with LUE to midline.    Ambulation / Gait / Stairs / Wheelchair Mobility  Ambulation/Gait General Gait Details: Deferred for safety at this time.    Posture / Balance Dynamic Sitting Balance Sitting balance - Comments: Intermittent min assist to correct midline orientation in chair. Improved ability to use LUE to pull and reposition. Balance Overall balance assessment: Needs assistance Sitting-balance support: Single extremity supported, Feet unsupported Sitting balance-Leahy Scale: Poor Sitting balance - Comments: Intermittent min assist to correct midline orientation in chair. Improved ability to use LUE to pull and reposition. Postural control: Posterior lean, Right lateral lean Standing balance support: Bilateral upper extremity supported Standing balance-Leahy Scale: Poor Standing balance comment: BIL UE support hand held with Rt knee block    Special needs/care consideration Skin intact and Special service needs Bilateral BKAs   Previous Home Environment (from acute therapy documentation) Living Arrangements: Spouse/significant other  Lives With: Spouse Available Help at Discharge: Family Type of Home: House Home Layout: Two level, Able to live on main level with bedroom/bathroom Home Access: Stairs to enter Entrance Stairs-Rails: Right Entrance Stairs-Number of Steps: 4 Bathroom Shower/Tub: Teacher, music: Standard Bathroom Accessibility: Yes How Accessible: Accessible via walker Home Care Services: No  Discharge Living Setting Plans for Discharge Living Setting: Patient's home Type of Home at Discharge: House Discharge Home Layout: Two level, 1/2 bath on main level Alternate Level Stairs-Rails: Right Alternate Level Stairs-Number of Steps: 15 Discharge Home Access: Stairs to enter Entrance Stairs-Rails: Right, Left Entrance Stairs-Number of Steps: 4 Discharge Bathroom Shower/Tub: Tub/shower unit Discharge Bathroom Toilet: Standard Discharge Bathroom Accessibility: Yes How Accessible: Accessible via walker Does the patient have any problems obtaining your medications?: No  Social/Family/Support Systems Patient Roles: Spouse Contact Information: jhoan pierotti Anticipated Caregiver: [phone] Anticipated Caregiver's Contact Information: 24/7 Ability/Limitations of Caregiver: Wife is ADON at [facility] [employer] [facility], will see if she can take FMLA or [facility] Discharge Plan Discussed with Primary Caregiver: Yes Is Caregiver In Agreement with Plan?: Yes Does Caregiver/Family  have Issues with Lodging/Transportation while Pt is in Rehab?: No  Goals Patient/Family Goal for Rehab: PT/OT min A - supervision Expected length of stay: 18-21 days Program Orientation Provided & Reviewed with Pt/Caregiver Including Roles  & Responsibilities: Yes  Decrease burden of Care through IP rehab admission: not anticipated    Possible need for SNF placement upon discharge: not anticipated    Patient Condition: I have reviewed medical records from [facility], spoken with CM, and patient and spouse. I met with patient at the bedside for inpatient rehabilitation assessment.  Patient will benefit from ongoing PT, OT, and SLP, can actively participate in 3 hours of therapy a day 5 days of the week, and can make measurable gains during the admission.  Patient will also benefit  from the coordinated team approach during an Inpatient Acute Rehabilitation admission.  The patient will receive intensive therapy as well as Rehabilitation physician, nursing, social worker, and care management interventions.  Due to safety, skin/wound care, disease management, medication administration, pain management, and patient education the patient requires 24 hour a day rehabilitation nursing.  The patient is currently Mod A+2 with mobility and basic ADLs.  Discharge setting and therapy post discharge at home with home health is anticipated.  Patient has agreed to participate in the Acute Inpatient Rehabilitation Program and will admit today.  Preadmission Screen Completed By:  Jeronimo Greaves, 09/24/2023 11:43 AM ______________________________________________________________________   Discussed status with Dr. Berline Chough  on 09/26/2023 at 1030 and received approval for admission today.  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 Yes Does the patient require coordinated care of a physician, rehab nurse, PT, OT, and SLP to address physical and functional deficits in the context of the above medical diagnosis(es)? Yes Addressing deficits in the following areas: balance, endurance, strength, transferring, bowel/bladder control, bathing, dressing,  feeding, grooming, toileting, cognition, speech, and language Can the patient actively participate in an intensive therapy program of at least 3 hrs of therapy 5 days a week? Yes The potential for patient to make measurable gains while on inpatient rehab is good Anticipated functional outcomes upon discharge from inpatient rehab: supervision and min assist PT, supervision and min assist OT, supervision SLP Estimated rehab length of stay to reach the above functional goals is: 18-21 days Anticipated discharge destination: Home 10. Overall Rehab/Functional Prognosis: good   MD Signature:

## 2023-09-24 NOTE — Progress Notes (Signed)
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 -Currently on aspirin 81 mg and Plavix 75 mg daily for 3 weeks, then aspirin alone  Bacteremia -Developed fever 102 F, in setting of immunosuppression for renal and pancreas transplant -Vitals are stable -Blood culture growing gram-negative rods, ID showed E. coli -Will continue with Zosyn and discontinue vancomycin, -Urine culture growing gram-negative rods  Nausea/retching -Restart Reglan 10 mg p.o. 3 times daily which patient takes at home -Discontinue pantoprazole and continue with omeprazole ,home dose  Acute respiratory failure - r/t inadequate airway protection in setting anaphylactic reaction and stroke  -Extubated -Speech therapy evaluation done, started on dysphagia 3 diet with thin liquid   ? Anaphylactic reaction -  Patient was getting his Belatacept infusion and became acutely altered and hypotensive.   Given Benadryl for potential allergic reaction and was sent to [facility] emergency department.   -Patient was given epinephrine here, and patient was subsequently intubated for altered mental status  and inability to protect airway.  -Currently on prednisone 5 mg daily -Tryptase level was elevated at 64.2; repeat tryptase level is 8.3 which is within normal range confirming that this was anaphylactic reaction  Erythrocytosis  - suspect r/t hemoconcentration, improved.    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  Baseline Scr ~1.6-1.8 -Serum creatinine is elevated at 2.93 -Start IV hydration with LR at 100 mL/h -Follow renal function in a.m. -continue home prednisone 5 mg daily -continue home mycophenolate 360 mg twice daily    #PAD status post bilateral BKA Patient has a past medical history of PAD.  Patient status post bilateral BKA.   P:  -cont home Crestor 10 mg daily   Abnormal TSH  - no known hx thyroid disease.  TSH 1.04 06/2023 now 7.941 on 9/23 > 9/24 0.97 T4 1.28  Hypertension -Continue Coreg, hydralazine -Avoid hypotension  Hypokalemia -Potassium 3.0 -Replaced with  potassium with K-Dur 40 mEq p.o. x 2 -Follow serum potassium today    Data Reviewed:   CBG:  Recent Labs  Lab 09/23/23 1539 09/23/23 2014 09/23/23 2336 09/24/23 0355 09/24/23 0816  GLUCAP 142* 201* 162* 162* 186*    SpO2: 95 % O2 Flow Rate (L/min): 2 L/min FiO2 (%): 30 %    Vitals:   09/23/23 2339 09/24/23 0359 09/24/23 0500 09/24/23 0822  BP: (!) 125/49 (!) 123/57  137/69  Pulse: 91 82  86  Resp: 19 19  18   Temp: 98.7 F (37.1 C) 99.8 F (37.7 C)  (!) 100.6 F (38.1 C)  TempSrc: Oral Oral  Oral  SpO2: 96% 92%  95%  Weight:   83.2 kg  Data Reviewed:  Basic Metabolic Panel: Recent Labs  Lab 09/19/23 0633 09/19/23 1132 09/20/23 0455 09/21/23 1033 09/22/23 1559 09/23/23 0733 09/24/23 1107  NA 132*   < > 136 135 136 135 133*  K 3.6   < > 3.1* 3.5 3.4* 3.0*  3.5  CL 105  --  103 100 99 101 97*  CO2 17*  --  25 24 24 23  20*  GLUCOSE 227*  --  118* 106* 108* 113* 160*  BUN 23*  --  16 11 14 16  21*  CREATININE 1.99*  --  1.66* 1.49* 1.73* 2.37* 2.93*  CALCIUM 7.4*  --  7.6* 8.0* 8.0* 8.0* 8.0*  MG 2.7*  --   --  2.0 1.9 1.8 1.9  PHOS 2.6  --   --  1.7* 2.9 2.5 2.7   < > = values in this interval not displayed.    CBC: Recent Labs  Lab 09/18/23 1358 09/18/23 1623 09/20/23 0455 09/21/23 1033 09/22/23 1559 09/23/23 0733 09/24/23 1107  WBC 6.8   < > 14.2* 11.1* 8.0 13.4* 13.9*  NEUTROABS 3.5  --   --   --   --   --   --   HGB 20.3*   < > 13.3 13.7 14.6 14.0 12.0*  HCT 65.0*   < > 41.2 42.3 45.3 42.7 37.0*  MCV 98.8   < > 94.5 95.7 98.5 96.8 94.4  PLT 288   < > 228 214 189 201 201   < > = values in this interval not displayed.    LFT Recent Labs  Lab 09/18/23 1358 09/24/23 1107  AST 17 21  ALT 12 12  ALKPHOS 80 62  BILITOT 1.0 0.7  PROT 5.4* 5.5*  ALBUMIN 2.5* 2.0*     Antibiotics: Anti-infectives (From admission, onward)    Start     Dose/Rate Route Frequency Ordered Stop   09/24/23 1300  vancomycin (VANCOCIN) IVPB 1000 mg/200 mL premix        1,000 mg 200 mL/hr over 60 Minutes Intravenous Every 24 hours 09/23/23 1224     09/23/23 1300  piperacillin-tazobactam (ZOSYN) IVPB 3.375 g        3.375 g 12.5 mL/hr over 240 Minutes Intravenous Every 8 hours 09/23/23 1200     09/23/23 1300  vancomycin (VANCOREADY) IVPB 1500 mg/300 mL        1,500 mg 150 mL/hr over 120 Minutes Intravenous  Once 09/23/23 1200 09/23/23 1554   09/20/23 1045  sulfamethoxazole-trimethoprim (BACTRIM) 400-80 MG per tablet 1 tablet        1 tablet Oral Every M-W-F 09/20/23 0952     09/20/23 1000  sulfamethoxazole-trimethoprim (BACTRIM) 400-80 MG per tablet 1 tablet  Status:  Discontinued        1 tablet Per Tube Every M-W-F 09/18/23 1705 09/20/23 0952   09/18/23 1700  sulfamethoxazole-trimethoprim (BACTRIM) 400-80 MG per tablet 1 tablet  Status:   Discontinued        1 tablet Oral Every M-W-F 09/18/23 1659 09/18/23 1705        DVT prophylaxis: Lovenox  Code Status: Full code  Family Communication: Discussed with patient's wife at bedside   CONSULTS PCCM, neurology   Subjective   Wife is concerned that patient is having constant retching and nausea due to medications.  He received Phenergan last night with some improvement.  Usually takes omeprazole at home, pantoprazole does not work as per wife.  Objective    Physical Examination:  Appears in mild  distress S1-S2, regular Abdomen-constant retching, abdomen is soft, nontender, no organomegaly Extremities-no edema  Status is: Inpatient:          [name]   [facility] If 7PM-7AM, please contact night-coverage at [email or web address], Office  [phone]   09/24/2023, 11:53 AM  LOS: 6 days

## 2023-09-25 ENCOUNTER — Inpatient Hospital Stay (HOSPITAL_COMMUNITY): Payer: 59

## 2023-09-25 DIAGNOSIS — I639 Cerebral infarction, unspecified: Secondary | ICD-10-CM | POA: Diagnosis not present

## 2023-09-25 DIAGNOSIS — G9341 Metabolic encephalopathy: Secondary | ICD-10-CM | POA: Diagnosis not present

## 2023-09-25 DIAGNOSIS — T782XXA Anaphylactic shock, unspecified, initial encounter: Secondary | ICD-10-CM | POA: Diagnosis not present

## 2023-09-25 LAB — GLUCOSE, CAPILLARY
Glucose-Capillary: 111 mg/dL — ABNORMAL HIGH (ref 70–99)
Glucose-Capillary: 118 mg/dL — ABNORMAL HIGH (ref 70–99)
Glucose-Capillary: 123 mg/dL — ABNORMAL HIGH (ref 70–99)
Glucose-Capillary: 155 mg/dL — ABNORMAL HIGH (ref 70–99)
Glucose-Capillary: 231 mg/dL — ABNORMAL HIGH (ref 70–99)
Glucose-Capillary: 70 mg/dL (ref 70–99)

## 2023-09-25 LAB — BASIC METABOLIC PANEL
Anion gap: 10 (ref 5–15)
BUN: 21 mg/dL — ABNORMAL HIGH (ref 6–20)
CO2: 20 mmol/L — ABNORMAL LOW (ref 22–32)
Calcium: 8 mg/dL — ABNORMAL LOW (ref 8.9–10.3)
Chloride: 102 mmol/L (ref 98–111)
Creatinine, Ser: 3.07 mg/dL — ABNORMAL HIGH (ref 0.61–1.24)
GFR, Estimated: 24 mL/min — ABNORMAL LOW (ref >60)
Glucose, Bld: 199 mg/dL — ABNORMAL HIGH (ref 70–99)
Potassium: 3.4 mmol/L — ABNORMAL LOW (ref 3.5–5.1)
Sodium: 132 mmol/L — ABNORMAL LOW (ref 135–145)

## 2023-09-25 LAB — URINE CULTURE: Culture: >=100000 — AB

## 2023-09-25 LAB — MAGNESIUM: Magnesium: 1.8 mg/dL (ref 1.7–2.4)

## 2023-09-25 LAB — CBC
HCT: 39.6 % (ref 39.0–52.0)
Hemoglobin: 12.6 g/dL — ABNORMAL LOW (ref 13.0–17.0)
MCH: 30.2 pg (ref 26.0–34.0)
MCHC: 31.8 g/dL (ref 30.0–36.0)
MCV: 95 fL (ref 80.0–100.0)
Platelets: 199 10*3/uL (ref 150–400)
RBC: 4.17 MIL/uL — ABNORMAL LOW (ref 4.22–5.81)
RDW: 16.4 % — ABNORMAL HIGH (ref 11.5–15.5)
WBC: 9.7 10*3/uL (ref 4.0–10.5)
nRBC: 0 % (ref 0.0–0.2)

## 2023-09-25 LAB — PHOSPHORUS: Phosphorus: 3.4 mg/dL (ref 2.5–4.6)

## 2023-09-25 LAB — TRIGLYCERIDES: Triglycerides: 126 mg/dL (ref <150)

## 2023-09-25 MED ORDER — POTASSIUM CHLORIDE 20 MEQ PO PACK
20.0000 meq | PACK | Freq: Once | ORAL | Status: DC
  Filled 2023-09-25: qty 1

## 2023-09-25 MED ORDER — MYCOPHENOLATE SODIUM 180 MG PO TBEC
180.0000 mg | DELAYED_RELEASE_TABLET | Freq: Two times a day (BID) | ORAL | Status: DC
  Administered 2023-09-25 – 2023-09-28 (×6): 180 mg via ORAL
  Filled 2023-09-25 (×7): qty 1

## 2023-09-25 MED ORDER — SODIUM CHLORIDE 0.9 % IV SOLN: INTRAVENOUS | Status: DC

## 2023-09-25 NOTE — Consult Note (Signed)
[facility] Renal Consultation Note  Requesting MD: Mauro Kaufmann, MD Indication for Consultation:  AKI   Chief complaint: altered mental status  HPI:  [name] is a 47 y.o. male with a history of renal and pancreatic transplant on chronic immunosuppresion, HTN, DM, and bilateral BKA's who presented to the hospital with AMS and hypotension from infusion center.  Per charting patient felt to have anaphylaxis.  He got benadryl at the infusion center and was transported to [facility].  He was minimially responsive and was given epinephrine per charting.  He was emergently intubated as he was not protecting his airway.  Code stroke was called.  Given that he wasn't able to get the belatacept, he was started on cyclosporine per nephrology after conversation between primary nephrologist and transplant center.  Administration times of cyclosporine has been variable.  Appears that the level on 9/29 was a true trough.  He was able to provide very limited history due to some word-finding difficulties; patient states that he has some trouble at baseline but is a little worse now.  Spoke with his RN on the floor and he is actually a little better than prior in this regard.  Note that he has been found to have E coli bacteremia.  He has had nausea.  He has been extubated and transferred to the floor.   Per Dr. Jon Gills charting on 9/26:  "Was informed by the infusion center of patients admission for event during Belatacept infusion.  I was able to find out that he had not had an infusion since June ( is supposed to get monthly )  I spoke to the transplant MD at [facility] -  since has not had Belatacept for so long would feel better if we cover with something in the meantime.  Continue the prednisone and myfortic but I will also add cyclosporine to his regimen and check a level in a few days.  His kidney allograft seems to be OK but did have hyperglycemia so am concerned about his pancreas  allograft-  will check amylase and lipase as they were elevated at his last appt with me"    Creatinine  Date/Time Value Ref Range Status  09/26/2021 12:00 AM 1.7 (A) 0.6 - 1.3 Final   Creat  Date/Time Value Ref Range Status  05/30/2014 02:15 PM 1.20 0.50 - 1.35 mg/dL Final   Creatinine, Ser  Date/Time Value Ref Range Status  09/25/2023 10:18 AM 3.07 (H) 0.61 - 1.24 mg/dL Final  36/64/4034 74:25 AM 2.93 (H) 0.61 - 1.24 mg/dL Final  95/63/8756 43:32 AM 2.37 (H) 0.61 - 1.24 mg/dL Final  95/18/8416 60:63 PM 1.73 (H) 0.61 - 1.24 mg/dL Final  01/60/1093 23:55 AM 1.49 (H) 0.61 - 1.24 mg/dL Final  73/22/0254 27:06 AM 1.66 (H) 0.61 - 1.24 mg/dL Final  23/76/2831 51:76 AM 1.99 (H) 0.61 - 1.24 mg/dL Final  16/06/3709 62:69 AM 2.00 (H) 0.61 - 1.24 mg/dL Final  48/54/6270 35:00 PM 2.00 (H) 0.61 - 1.24 mg/dL Final  93/81/8299 37:16 PM 1.96 (H) 0.61 - 1.24 mg/dL Final  96/78/9381 01:75 AM 1.83 (H) 0.61 - 1.24 mg/dL Final  10/19/8526 78:24 AM 1.84 (H) 0.61 - 1.24 mg/dL Final  23/53/6144 31:54 PM 2.11 (H) 0.61 - 1.24 mg/dL Final  00/86/7619 50:93 AM 1.38 (H) 0.61 - 1.24 mg/dL Final  26/71/2458 09:98 AM 1.48 (H) 0.61 - 1.24 mg/dL Final  33/82/5053 97:67 AM 1.64 (H) 0.61 - 1.24 mg/dL Final  34/19/3790 24:09 PM 1.68 (H) 0.40 -  1.50 mg/dL Final  82/95/6213 08:65 PM 1.52 (H) 0.61 - 1.24 mg/dL Final  78/46/9629 52:84 AM 1.82 (H) 0.61 - 1.24 mg/dL Final  13/24/4010 27:25 AM 1.72 (H) 0.61 - 1.24 mg/dL Final  36/64/4034 74:25 AM 1.54 (H) 0.61 - 1.24 mg/dL Final  95/63/8756 43:32 AM 1.60 (H) 0.61 - 1.24 mg/dL Final  95/18/8416 60:63 AM 1.91 (H) 0.61 - 1.24 mg/dL Final  01/60/1093 23:55 AM 1.50 (H) 0.61 - 1.24 mg/dL Final  73/22/0254 27:06 AM 1.50 (H) 0.61 - 1.24 mg/dL Final  23/76/2831 51:76 PM 1.59 (H) 0.61 - 1.24 mg/dL Final  16/06/3709 62:69 PM 1.63 (H) 0.61 - 1.24 mg/dL Final  48/54/6270 35:00 PM 1.50 (H) 0.61 - 1.24 mg/dL Final  93/81/8299 37:16 AM 1.58 (H) 0.61 - 1.24 mg/dL Final  96/78/9381  01:75 AM 1.48 (H) 0.61 - 1.24 mg/dL Final  10/19/8526 78:24 AM 1.54 (H) 0.61 - 1.24 mg/dL Final  23/53/6144 31:54 AM 1.56 (H) 0.61 - 1.24 mg/dL Final  00/86/7619 50:93 PM 1.50 (H) 0.61 - 1.24 mg/dL Final  26/71/2458 09:98 PM 1.58 (H) 0.61 - 1.24 mg/dL Final  33/82/5053 97:67 PM 1.70 (H) 0.61 - 1.24 mg/dL Final  34/19/3790 24:09 PM 1.77 (H) 0.61 - 1.24 mg/dL Final  73/53/2992 42:68 PM 1.54 (H) 0.61 - 1.24 mg/dL Final  34/19/6222 97:98 PM 1.64 (H) 0.61 - 1.24 mg/dL Final  92/10/9416 40:81 AM 1.38 (H) 0.61 - 1.24 mg/dL Final  44/81/8563 14:97 PM 1.65 (H) 0.61 - 1.24 mg/dL Final  02/63/7858 85:02 PM 1.69 (H) 0.61 - 1.24 mg/dL Final  77/41/2878 67:67 PM 1.56 (H) 0.61 - 1.24 mg/dL Final  20/94/7096 28:36 AM 1.36 (H) 0.61 - 1.24 mg/dL Final  62/94/7654 65:03 PM 1.43 (H) 0.61 - 1.24 mg/dL Final  54/65/6812 75:17 PM 1.40 (H) 0.50 - 1.35 mg/dL Final  00/17/4944 96:75 AM 1.30 0.50 - 1.35 mg/dL Final  91/63/8466 59:93 AM 1.59 (H) 0.50 - 1.35 mg/dL Final  57/12/7791 90:30 PM 1.11 0.50 - 1.35 mg/dL Final  09/17/3006 62:26 AM 1.07 0.50 - 1.35 mg/dL Final  33/35/4562 56:38 AM 1.09 0.50 - 1.35 mg/dL Final  93/73/4287 68:11 PM 0.90 0.50 - 1.35 mg/dL Final  57/26/2035 59:74 PM 1.20 0.50 - 1.35 mg/dL Final     PMHx:   Past Medical History:  Diagnosis Date   AMPUTATION, BELOW KNEE, 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   Diabetes mellitus without complication [facility])    no since pancreas transplant   Dialysis patient [facility]) 04/18/2012   "[facility]; [place], [place] [place]"   Gastroparesis    Gastropathy    GERD (gastroesophageal reflux disease)    Hypertension    MRSA infection    over 10 years ago per patient. in legs    Past Surgical History:  Procedure Laterality Date   AV FISTULA PLACEMENT  08/2011   left upper arm   BELOW  KNEE LEG AMPUTATION  "it's been awhile"   bilaterally   CATARACT EXTRACTION  ~ 2011   right   COMBINED KIDNEY-PANCREAS TRANSPLANT  2014   ESOPHAGOGASTRODUODENOSCOPY N/A 12/30/2016   Procedure: ESOPHAGOGASTRODUODENOSCOPY (EGD);  Surgeon: Jeani Hawking, MD;  Location: [facility] ENDOSCOPY;  Service: Endoscopy;  Laterality: N/A;   OLECRANON BURSECTOMY Right 06/23/2020   Procedure: RIGHT ELBOW  EXCISION OLECRANON BURSITIS;  Surgeon: Nadara Mustard, MD;  Location: [facility];  Service: Orthopedics;  Laterality: Right;    Family Hx:  Family History  Problem Relation Age of Onset   Hypertension Mother    Diabetes Mother    Kidney disease Mother    Diabetes Maternal Grandmother    Diabetes Paternal Grandmother    Diabetes Other    Hypertension Other    Lung cancer Maternal Aunt    Colon cancer Neg Hx    Esophageal cancer Neg Hx    Rectal cancer Neg Hx    Stomach cancer Neg Hx     Social History:  reports that he quit smoking about 14 months ago. His smoking use included cigars. He has never used smokeless tobacco. He reports that he does not currently use drugs after having used the following drugs: Marijuana. Frequency: 3.00 times per week. He reports that he does not drink alcohol.  Allergies:  Allergies  Allergen Reactions   Amlodipine Rash   Lisinopril Rash    Medications: Prior to Admission medications   Medication Sig Start Date End Date Taking? Authorizing Provider  belatacept (NULOJIX) 250 MG SOLR injection Inject into the vein every 30 (thirty) days.   Yes [provider]  carvedilol (COREG) 6.25 MG tablet Take 1 tablet (6.25 mg total) by mouth 2 (two) times daily with a meal. 09/06/23 12/05/23 Yes [place], Alvira Philips, DO  ergocalciferol (VITAMIN D2) 1.25 MG (50000 UT) capsule Take 1 capsule (50,000 Units total) by mouth once a week. Patient taking differently: Take 1 capsule by mouth once a week. Wednesday 05/19/23  Yes   fluocinonide cream (LIDEX) 0.05 % Apply one  application to affected areas twice daily 01/25/23  Yes   hydrALAZINE (APRESOLINE) 50 MG tablet Take 1 tablet (50 mg total) by mouth 4 (four) times daily. Patient taking differently: Take 50 mg by mouth 2 (two) times daily. 07/22/23  Yes [name], MD  meclizine (ANTIVERT) 25 MG tablet Take 25 mg by mouth 3 (three) times daily as needed for dizziness or nausea. 08/28/23  Yes [provider]  metoCLOPramide (REGLAN) 10 MG tablet Take 10 mg by mouth 2 (two) times daily.   Yes [provider]  mycophenolate (MYFORTIC) 180 MG EC tablet Take 360 mg by mouth 2 (two) times daily. 07/06/13  Yes [provider]  omeprazole (PRILOSEC) 40 MG capsule Take 40 mg by mouth daily. 02/01/16  Yes [provider]  ondansetron (ZOFRAN) 4 MG tablet Take 4 mg by mouth 2 (two) times daily as needed for nausea or vomiting.   Yes [provider]  predniSONE (DELTASONE) 5 MG tablet Take 5 mg by mouth daily. 06/20/13  Yes [provider]  sulfamethoxazole-trimethoprim (BACTRIM,SEPTRA) 400-80 MG per tablet Take 1 tablet by mouth every Monday, Wednesday, and Friday.    Yes [provider]  terazosin (HYTRIN) 2 MG capsule Take 1 capsule (2 mg total) by mouth at bedtime. 09/11/23  Yes [name], MD  rosuvastatin (CRESTOR) 10 MG tablet Take 1 tablet (10 mg total) by mouth daily. Patient not taking: Reported on 09/18/2023 07/26/23   [name], MD   I have reviewed the patient's current and reported prior to admission medications.  Labs:     Latest Ref Rng & Units 09/25/2023   10:18 AM 09/24/2023   11:07 AM 09/23/2023    7:33 AM  BMP  Glucose 70 - 99 mg/dL 161  096  045  BUN 6 - 20 mg/dL 21  21  16    Creatinine 0.61 - 1.24 mg/dL 1.61  0.96  0.45   Sodium 135 - 145 mmol/L 132  133  135   Potassium 3.5 - 5.1 mmol/L 3.4  3.5  3.0   Chloride 98 - 111 mmol/L 102  97  101   CO2 22 - 32 mmol/L 20  20  23    Calcium 8.9 - 10.3 mg/dL 8.0  8.0  8.0      Urinalysis    Component Value Date/Time   COLORURINE AMBER (A) 09/23/2023 1230   APPEARANCEUR TURBID (A) 09/23/2023 1230   LABSPEC 1.008 09/23/2023 1230   PHURINE 6.0 09/23/2023 1230   GLUCOSEU NEGATIVE 09/23/2023 1230   GLUCOSEU NEGATIVE 09/18/2023 1049   HGBUR SMALL (A) 09/23/2023 1230   HGBUR negative 10/08/2010 0853   BILIRUBINUR NEGATIVE 09/23/2023 1230   BILIRUBINUR SMALL 12/14/2015 1139   KETONESUR NEGATIVE 09/23/2023 1230   PROTEINUR 100 (A) 09/23/2023 1230   UROBILINOGEN 0.2 09/18/2023 1049   NITRITE NEGATIVE 09/23/2023 1230   LEUKOCYTESUR LARGE (A) 09/23/2023 1230     ROS:  Pertinent items noted in HPI and remainder of comprehensive ROS otherwise negative. However note limitations of patient with word-finding difficulty.  He sometimes participates with nods and shaking his head, and yes/no  Physical Exam: Vitals:   09/25/23 1600 09/25/23 1605  BP: (!) 145/70 (!) 145/70  Pulse: 67 67  Resp: 18 16  Temp: 97.8 F (36.6 C) 97.8 F (36.6 C)  SpO2: 97% 97%     General:  adult male in bed in NAD at rest   HEENT: NCAT  Eyes: EOMI sclera anicteric Neck: supple trachea midline  Heart: S1S2 no rub Lungs: clear and unlabored; room air  Abdomen: soft/nt/nd Extremities: bilateral BKA's no edema of residual limbs  Skin: no rash on extremities exposed Psych no anxiety or agitation  Neuro: awake and interactive with some word-finding difficulties  Assessment/Plan:  # AKI  - may be hemodynamic/ATN in the setting of severe allergic reaction requiring intubation as well as pre-renal insults from gram negative bacteremia  - baseline Cr per the [facility] system appears to be in the high 1's - Continue supportive care  - he has a cyclosporine trough in process from 9/29 which does appear to be a true trough  - Start NS at 75 ml/hr x 24 hours  - would repeat renal transplant ultrasound with duplex   # Acute ischemic infarct - neurology has seen  - may be in setting of  severe hypotensive event per neuro  - Per neuro note: "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  Reduce myfortic   # HTN  - acceptable control   # Allergic reaction - to belatacept  - note his regimen has been adjusted  # Hypokalemia - see that gentle repletion is ordered    Please continue inpatient monitoring   Estanislado Emms 09/25/2023, 6:59 PM

## 2023-09-25 NOTE — Progress Notes (Signed)
Inpatient Rehab Admissions Coordinator:    Insurance denied initial case for CIR stating he was not ready due to fever. Fever resolved now, so case was re-sent to insurance.   Megan Salon, MS, CCC-SLP Rehab Admissions Coordinator  [phone] (celll) [phone] (office)

## 2023-09-25 NOTE — Progress Notes (Addendum)
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 -Currently on aspirin 81 mg and Plavix 75 mg daily for 3 weeks, then aspirin alone  Bacteremia -Developed fever 102 F, in setting of immunosuppression for renal and pancreas transplant -Vitals are stable -Blood culture growing gram-negative rods, ID showed E. coli -Will continue with Zosyn -Vancomycin was discontinued -Urine culture growing E. coli  Nausea/retching -Restart Reglan 10 mg p.o. 3 times daily which patient takes at home -Continue pantoprazole 40 mg p.o. twice daily -Start chlorpromazine 10 mg p.o. nightly as needed for hiccups    Acute respiratory failure - r/t inadequate airway protection in setting anaphylactic reaction and stroke  -Extubated -Speech therapy evaluation done, started on dysphagia 3 diet with thin liquid   ? Anaphylactic reaction -  Patient was getting his Belatacept infusion and became acutely altered and hypotensive.   Given Benadryl for potential allergic reaction and was sent to [facility] emergency department.   -Patient was given epinephrine here, and patient was subsequently  intubated for altered mental status and inability to protect airway.  -Currently on prednisone 5 mg daily -Tryptase level was elevated at 64.2; repeat tryptase level is 8.3 which is within normal range confirming that this was anaphylactic reaction  Erythrocytosis  - suspect r/t hemoconcentration, improved.    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  Baseline Scr ~1.6-1.8 -Serum creatinine is rising, today creatinine is 3.07 -Continue IV hydration with LR at 100 mL/h -continue home prednisone 5 mg daily -continue home mycophenolate 360 mg twice daily -Continue cyclosporine - Belatacept has been discontinued due to anaphylactic reaction as above, Dr. Kathrene Bongo discussed with transplant medicine at [facility] and they recommended to hold this medication and start cyclosporine.  Cyclosporine has been started. -Will consult nephrology; ordereded transplant ultrasound today   #PAD status post bilateral BKA Patient has a past medical history of PAD.  Patient status post bilateral BKA.   P:  -cont home Crestor 10 mg daily   Abnormal TSH  - no known hx thyroid disease.  TSH 1.04 06/2023 now 7.941 on 9/23 > 9/24 0.97 T4 1.28  Hypertension -Continue Coreg, hydralazine -Avoid hypotension  Hypokalemia -Potassium 3.4 today -Replace potassium and follow BMP in am   Data Reviewed:   CBG:  Recent Labs  Lab 09/24/23 2142 09/24/23 2340 09/25/23 0419 09/25/23 0818 09/25/23 1221  GLUCAP 196* 139* 118* 123* 155*    SpO2: 100 % O2 Flow Rate (L/min): 2 L/min FiO2 (%): 30 %    Vitals:   09/24/23 2337 09/25/23 0414 09/25/23 0757 09/25/23 1224  BP: 128/62 (!) 145/70 (!) 145/73 (!) 130/47  Pulse:  69 84 77 78  Resp: 16 16 18 18   Temp: 98.1 F (36.7 C) 99.4 F (37.4 C) 98.8 F  (37.1 C) 99.2 F (37.3 C)  TempSrc: Axillary Axillary Oral Oral  SpO2: 98% 96% 94% 100%  Weight:          Data Reviewed:  Basic Metabolic Panel: Recent Labs  Lab 09/21/23 1033 09/22/23 1559 09/23/23 0733 09/24/23 1107 09/25/23 0708 09/25/23 1018  NA 135 136 135 133*  --  132*  K 3.5 3.4* 3.0* 3.5  --  3.4*  CL 100 99 101 97*  --  102  CO2 24 24 23  20*  --  20*  GLUCOSE 106* 108* 113* 160*  --  199*  BUN 11 14 16  21*  --  21*  CREATININE 1.49* 1.73* 2.37* 2.93*  --  3.07*  CALCIUM 8.0* 8.0* 8.0* 8.0*  --  8.0*  MG 2.0 1.9 1.8 1.9 1.8  --   PHOS 1.7* 2.9 2.5 2.7 3.4  --     CBC: Recent Labs  Lab 09/18/23 1358 09/18/23 1623 09/21/23 1033 09/22/23 1559 09/23/23 0733 09/24/23 1107 09/25/23 1018  WBC 6.8   < > 11.1* 8.0 13.4* 13.9* 9.7  NEUTROABS 3.5  --   --   --   --   --   --   HGB 20.3*   < > 13.7 14.6 14.0 12.0* 12.6*  HCT 65.0*   < > 42.3 45.3 42.7 37.0* 39.6  MCV 98.8   < > 95.7 98.5 96.8 94.4 95.0  PLT 288   < > 214 189 201 201 199   < > = values in this interval not displayed.    LFT Recent Labs  Lab 09/18/23 1358 09/24/23 1107  AST 17 21  ALT 12 12  ALKPHOS 80 62  BILITOT 1.0 0.7  PROT 5.4* 5.5*  ALBUMIN 2.5* 2.0*     Antibiotics: Anti-infectives (From admission, onward)    Start     Dose/Rate Route Frequency Ordered Stop   09/24/23 1300  vancomycin (VANCOCIN) IVPB 1000 mg/200 mL premix        1,000 mg 200 mL/hr over 60 Minutes Intravenous Every 24 hours 09/23/23 1224 09/24/23 1309   09/23/23 1300  piperacillin-tazobactam (ZOSYN) IVPB 3.375 g        3.375 g 12.5 mL/hr over 240 Minutes Intravenous Every 8 hours 09/23/23 1200     09/23/23 1300  vancomycin (VANCOREADY) IVPB 1500 mg/300 mL        1,500 mg 150 mL/hr over 120 Minutes Intravenous  Once 09/23/23 1200 09/23/23 1554   09/20/23 1045  sulfamethoxazole-trimethoprim (BACTRIM) 400-80 MG per tablet 1 tablet        1 tablet Oral Every M-W-F 09/20/23 0952     09/20/23 1000   sulfamethoxazole-trimethoprim (BACTRIM) 400-80 MG per tablet 1 tablet  Status:  Discontinued        1 tablet Per Tube Every M-W-F 09/18/23 1705 09/20/23 0952   09/18/23 1700  sulfamethoxazole-trimethoprim (BACTRIM) 400-80 MG per tablet 1 tablet  Status:  Discontinued        1 tablet Oral Every M-W-F 09/18/23 1659 09/18/23 1705        DVT prophylaxis: Lovenox  Code Status: Full code  Family Communication: Discussed with patient's wife at bedside   CONSULTS PCCM, neurology   Subjective   Eating better after starting Reglan.  Still having hiccups intermittently.  Renal function is getting worse.  Objective    Physical Examination:  Appears  in no acute distress S1-S2, regular Lungs clear to auscultation bilaterally Neuro alert, communicating, right hemiparesis  Status is: Inpatient:          [name]   [facility] If 7PM-7AM, please contact night-coverage at [email or web address], Office  [phone]   09/25/2023, 1:44 PM  LOS: 7 days

## 2023-09-25 NOTE — Progress Notes (Signed)
Physical Therapy Treatment Patient Details Name: [name] MRN: [id number] DOB: June 02, 1976 Today's Date: 09/25/2023   History of Present Illness Pt is a 47 y.o. male who presented 09/18/23 from infusion center where he was receiving his Belatacept infusion for chronic immunosuppressive therapy and during his infusion he became hypotensive and had full body burning sensation. Pt received IM epinephrine on ED arrival with improvement in symptoms but noted difficulty speaking and facial droop. Code stroke activated. Imaging revealed scattered small foci of acute subcortical infarction in the left occipital lobe and posterior left frontal lobe and occluded left ICA. PMH includes HTN, DMII, PAD s/p bilateral BKA, renal and pancreatic transplant, depression, GERD.    PT Comments  Pt seen for PT tx with co-tx with OT for pt & therapists' safety. Session focused on transfer training, balance, and midline orientation. Pt is making steady progress with mobility, able to stand at sink with +2 assist. Pt requires blocking R knee to prevent buckling in standing, unable to use RUE to support 2/2 weakness & impaired neuromuscular control. Wife present & supportive throughout session. Continue to recommend post acute rehab >3 hours therapy/day to maximize independence with mobility & reduce caregiver burden.    If plan is discharge home, recommend the following: Assist for transportation;Two people to help with walking and/or transfers;Two people to help with bathing/dressing/bathroom;Assistance with cooking/housework;Direct supervision/assist for medications management;Direct supervision/assist for financial management;Help with stairs or ramp for entrance   Can travel by private vehicle        Equipment Recommendations  Other (comment) (TBD)    Recommendations for Other Services Rehab consult     Precautions / Restrictions Precautions Precautions: Fall;Other (comment) Precaution Comments: BP <  220/110 but avoid hypotension, bil BKA hx (prostheses in room), R hemi Restrictions Weight Bearing Restrictions: No     Mobility  Bed Mobility               General bed mobility comments: not tested, pt received & left in recliner    Transfers Overall transfer level: Needs assistance Equipment used: 2 person hand held assist, Rolling walker (2 wheels) Transfers: Sit to/from Stand, Bed to chair/wheelchair/BSC Sit to Stand: Mod assist, Max assist, +2 physical assistance, +2 safety/equipment Stand pivot transfers: Max assist, +2 physical assistance, +2 safety/equipment         General transfer comment: Pt transferred recliner>BSC with +2 via stand pivot, STS from [facility] with RW & +2, STS from recliner at sink with +2 HHA. Therapists provides cuing for hand placement, sequencing. Pt attempting to step forward vs pivot buttocks during stand pivot. Pt with difficulty using RW 2/2 inability to keep RUE on RW despite PT attempts to assist.    Ambulation/Gait                   Stairs             Wheelchair Mobility     Tilt Bed    Modified Rankin (Stroke Patients Only)       Balance Overall balance assessment: Needs assistance Sitting-balance support: Feet supported Sitting balance-Leahy Scale: Poor     Standing balance support: Bilateral upper extremity supported Standing balance-Leahy Scale: Poor Standing balance comment: BUE support on sink                            Cognition Arousal: Alert Behavior During Therapy: Flat affect Overall Cognitive Status: Difficult to assess Area of Impairment: Following  commands, Safety/judgement, Problem solving, Attention                       Following Commands: Follows one step commands inconsistently, Follows one step commands with increased time Safety/Judgement: Decreased awareness of safety, Decreased awareness of deficits Awareness: Intellectual, Emergent Problem Solving: Slow  processing, Difficulty sequencing, Decreased initiation, Requires verbal cues, Requires tactile cues          Exercises Other Exercises Other Exercises: Pt stood at sink using mirror for visual feedback for midline orientation & upright posture. Pt unable to extend R knee in standing, requires PT assistance to do so, cuing & facilitation for upright posture. Pt progressed to performing standing weight shifts with assistance to facilitate.    General Comments General comments (skin integrity, edema, etc.): wife at bedside, very engaged & appreciative of therapy efforts.      Pertinent Vitals/Pain Pain Assessment Pain Assessment: No/denies pain    Home Living                          Prior Function            PT Goals (current goals can now be found in the care plan section) Acute Rehab PT Goals Patient Stated Goal: to improve PT Goal Formulation: With patient/family Time For Goal Achievement: 10/04/23 Potential to Achieve Goals: Good Additional Goals Additional Goal #1: Pt will deny dizziness when performing bed mobility and transfers Additional Goal #2: Pt will be able to perform VOR x1 exercise for >1 minute without symptoms of dizziness Progress towards PT goals: Progressing toward goals    Frequency    Min 1X/week      PT Plan      Co-evaluation PT/OT/SLP Co-Evaluation/Treatment: Yes Reason for Co-Treatment: Complexity of the patient's impairments (multi-system involvement);Necessary to address cognition/behavior during functional activity;For patient/therapist safety;To address functional/ADL transfers PT goals addressed during session: Mobility/safety with mobility;Balance;Strengthening/ROM        AM-PAC PT "6 Clicks" Mobility   Outcome Measure  Help needed turning from your back to your side while in a flat bed without using bedrails?: A Lot Help needed moving from lying on your back to sitting on the side of a flat bed without using bedrails?:  A Lot Help needed moving to and from a bed to a chair (including a wheelchair)?: Total Help needed standing up from a chair using your arms (e.g., wheelchair or bedside chair)?: Total Help needed to walk in hospital room?: Total Help needed climbing 3-5 steps with a railing? : Total 6 Click Score: 8    End of Session Equipment Utilized During Treatment: Gait belt Activity Tolerance: Patient tolerated treatment well Patient left: in chair;with call bell/phone within reach;with family/visitor present Nurse Communication: Mobility status PT Visit Diagnosis: Other symptoms and signs involving the nervous system (R29.898);Unsteadiness on feet (R26.81);Other abnormalities of gait and mobility (R26.89);Muscle weakness (generalized) (M62.81);Difficulty in walking, not elsewhere classified (R26.2);Hemiplegia and hemiparesis Hemiplegia - Right/Left: Right Hemiplegia - dominant/non-dominant: Dominant Hemiplegia - caused by: Cerebral infarction     Time: 1610-9604 PT Time Calculation (min) (ACUTE ONLY): 25 min  Charges:    $Neuromuscular Re-education: 8-22 mins PT General Charges $$ ACUTE PT VISIT: 1 Visit                     Aleda Grana, PT, DPT 09/25/23, 11:15 AM   Sandi Mariscal 09/25/2023, 11:14 AM

## 2023-09-25 NOTE — TOC Initial Note (Signed)
Transition of Care [facility]) - Initial/Assessment Note    Patient Details  Name: [name] MRN: [id number] Date of Birth: 07/18/1976  Transition of [facility]) CM/SW Contact:    Kermit Balo, RN Phone Number: 09/25/2023, 11:34 AM  Clinical Narrative:                  Patient is from home with his spouse. She is able to provide supervision at home for him after a rehab stay.  Wife states she is able to set him up downstairs of their home and assist him as needed.  Spouse is able to provide transportation and she will manage his medications at home. Awaiting insurance for CIR. TOC following.   Expected Discharge Plan: IP Rehab Facility Barriers to Discharge: Continued Medical Work up   Patient Goals and CMS Choice   CMS Medicare.gov Compare Post Acute Care list provided to:: Patient Choice offered to / list presented to : Patient, Spouse      Expected Discharge Plan and Services   Discharge Planning Services: CM Consult Post Acute Care Choice: IP Rehab Living arrangements for the past 2 months: Single Family Home                                      Prior Living Arrangements/Services Living arrangements for the past 2 months: Single Family Home Lives with:: Spouse Patient language and need for interpreter reviewed:: Yes Do you feel safe going back to the place where you live?: Yes        Care giver support system in place?: Yes (comment) Current home services: DME (Bilateral LE prosthesis) Criminal Activity/Legal Involvement Pertinent to Current Situation/Hospitalization: No - Comment as needed  Activities of Daily Living   ADL Screening (condition at time of admission) Does the patient have a NEW difficulty with bathing/dressing/toileting/self-feeding that is expected to last >3 days?: No (yes and PT/OT following) Does the patient have a NEW difficulty with getting in/out of bed, walking, or climbing stairs that is expected to last >3 days?: No (yes  and PT/OT following) Does the patient have a NEW difficulty with communication that is expected to last >3 days?: No (sppech eval completed) Is the patient deaf or have difficulty hearing?: No Does the patient have difficulty seeing, even when wearing glasses/contacts?: Yes (double vision) Does the patient have difficulty concentrating, remembering, or making decisions?: Yes  Permission Sought/Granted                  Emotional Assessment Appearance:: Appears stated age   Affect (typically observed): Quiet Orientation: : Oriented to Self   Psych Involvement: No (comment)  Admission diagnosis:  Altered mental status, unspecified altered mental status type [R41.82] Acute metabolic encephalopathy [G93.41] Patient Active Problem List   Diagnosis Date Noted   Cerebral ischemic stroke due to global hypoperfusion with watershed infarct [facility]) 09/20/2023   Anaphylactic shock 09/20/2023   Altered mental status 09/20/2023   PSA elevation 09/18/2023   Hypertension secondary to other renal disorders 09/18/2023   Acute metabolic encephalopathy 09/18/2023   UTI (urinary tract infection) 09/04/2023   PAD (peripheral artery disease) (HCC) 09/04/2023   Diabetes mellitus type II, controlled (HCC) 09/04/2023   E. coli UTI (urinary tract infection) 07/20/2023   Abnormal electrocardiogram (ECG) (EKG) 07/20/2023   Chronic pain 07/04/2023   Tobacco dependence 07/04/2023   Marijuana dependence (HCC) 07/04/2023   Screen for colon  cancer 01/10/2022   Balanitis 01/10/2022   Encounter for general adult medical examination with abnormal findings 01/10/2022   Screening for prostate cancer 01/10/2022   Abnormal EKG 10/26/2017   Healthcare maintenance 08/26/2016   Impaired mobility and activities of daily living 07/03/2014   Renal transplant, status post 07/19/2013   History of pancreas transplant (HCC) 07/19/2013   Immunosuppressed status (HCC) 06/27/2013   H/O pancreas transplant (HCC) 06/27/2013    Diabetic gastroparesis associated with type 1 diabetes mellitus (HCC) 05/08/2013   S/P bilateral BKA (below knee amputation) (HCC) 08/17/2012   Metabolic bone disease 07/20/2011   ESRD (end stage renal disease) (HCC) 11/17/2009   GERD 03/14/2007   Dyslipidemia, goal LDL below 100 02/22/2007   Major depressive disorder, recurrent episode (HCC) 02/22/2007   POST TRAUMATIC STRESS DISORDER 02/22/2007   IMPOTENCE, 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   PCP:  [name], MD Pharmacy:   [id number] - [place], [place] - [address] AT [facility] OF [address] & [address] [place] [address] Phone: [phone] Fax: [phone]  [facility] [address] [place] [address] Phone: [phone] Fax: [phone]     Social Determinants of Health (SDOH) Social History: SDOH Screenings   Food Insecurity: No Food Insecurity (09/20/2023)  Housing: Low Risk  (09/20/2023)  Transportation Needs: No Transportation Needs (09/20/2023)  Utilities: Not At Risk (09/20/2023)  Alcohol Screen: Low Risk  (09/06/2022)  Depression (PHQ2-9): Low Risk  (09/11/2023)  Financial Resource Strain: Low Risk  (09/11/2023)  Physical Activity: Inactive (09/11/2023)  Social Connections: Socially Integrated (09/11/2023)  Stress: No Stress Concern Present (09/11/2023)  Tobacco Use: Medium Risk (09/18/2023)  Health Literacy: Adequate Health Literacy (09/11/2023)   SDOH Interventions:     Readmission Risk Interventions    09/06/2023   12:10 PM  Readmission Risk Prevention Plan  Transportation Screening Complete  PCP or Specialist Appt within 5-7 Days Complete  Home Care Screening Complete  Medication Review (RN CM) Referral to Pharmacy

## 2023-09-25 NOTE — Progress Notes (Signed)
Occupational Therapy Treatment Patient Details Name: [name] MRN: [id number] DOB: [birth year] Today's Date: 09/25/2023   History of present illness Pt is a 47 y.o. male who presented 09/18/23 from infusion center where he was receiving his Belatacept infusion for chronic immunosuppressive therapy and during his infusion he became hypotensive and had full body burning sensation. Pt received IM epinephrine on ED arrival with improvement in symptoms but noted difficulty speaking and facial droop. Code stroke activated. Imaging revealed scattered small foci of acute subcortical infarction in the left occipital lobe and posterior left frontal lobe and occluded left ICA. PMH includes HTN, DMII, PAD s/p bilateral BKA, renal and pancreatic transplant, depression, GERD.   OT comments  Patient continues to make steady progress towards goals in skilled OT session. Patient's session encompassed increasing overall awareness to R side, toileting, and working on finding midline with use of the mirror at the sink. Patient with improved command following in session, however requires max cues in order to pace himself with movements due to increased impulsivity. Patient also noted to get overwhelmed with lack of communication, benefitting from reassurance and cues to take some deep breaths and attempt again (improved expressive language noted with breaks). Patient remains max A for ADLs, and mod-max A of 2 when completing transfers sit<>stands (though improved in front of mirror but PT blocking RLE to maintain standing). OT continues to recommend intensive rehab, will continue to follow.        If plan is discharge home, recommend the following:  Two people to help with walking and/or transfers;Two people to help with bathing/dressing/bathroom;Assistance with cooking/housework;Direct supervision/assist for medications management;Assistance with feeding;Direct supervision/assist for financial management;Assist  for transportation;Help with stairs or ramp for entrance;Supervision due to cognitive status   Equipment Recommendations  Other (comment) (defer to next venue)    Recommendations for Other Services      Precautions / Restrictions Precautions Precautions: Fall;Other (comment) Precaution Comments: BP < 220/110 but avoid hypotension, bil BKA hx (prostheses in room), R hemi Restrictions Weight Bearing Restrictions: No       Mobility Bed Mobility               General bed mobility comments: not tested, pt received & left in recliner    Transfers Overall transfer level: Needs assistance Equipment used: 2 person hand held assist, Rolling walker (2 wheels) Transfers: Sit to/from Stand, Bed to chair/wheelchair/BSC Sit to Stand: Mod assist, Max assist, +2 physical assistance, +2 safety/equipment Stand pivot transfers: Max assist, +2 physical assistance, +2 safety/equipment         General transfer comment: Pt transferred recliner>BSC with +2 via stand pivot, STS from [facility] with RW & +2, STS from recliner at sink with +2 HHA. Therapists provides cuing for hand placement, sequencing. Pt attempting to step forward vs pivot buttocks during stand pivot. Pt with difficulty using RW 2/2 inability to keep RUE on RW despite PT attempts to assist.     Balance Overall balance assessment: Needs assistance Sitting-balance support: Feet supported Sitting balance-Leahy Scale: Poor     Standing balance support: Bilateral upper extremity supported Standing balance-Leahy Scale: Poor Standing balance comment: BUE support on sink                           ADL either performed or assessed with clinical judgement   ADL Overall ADL's : Needs assistance/impaired  Toilet Transfer: Maximal assistance;+2 for physical assistance;+2 for safety/equipment;Stand-pivot;BSC/3in1   Toileting- Clothing Manipulation and Hygiene: Total assistance;Sitting/lateral  lean;Sit to/from stand Toileting - Clothing Manipulation Details (indicate cue type and reason): unable to complete peri-care in standing     Functional mobility during ADLs: Maximal assistance;+2 for safety/equipment;+2 for physical assistance;Cueing for safety;Cueing for sequencing General ADL Comments: Session focos on increasing overall awareness to R side, toileting, and working on finding midline with use of the mirror at the sink. Patient with improved command following in session, however requires max cues in order to pace himself with movements due to increased impulsivity. Patient also noted to get overwhelmed with lack of communication, benefitting from reassurance and cues to take some deep breaths and attempt again (improved expressive language noted with breaks).    Extremity/Trunk Assessment              Vision       Perception     Praxis      Cognition Arousal: Alert Behavior During Therapy: Flat affect Overall Cognitive Status: Difficult to assess Area of Impairment: Following commands, Safety/judgement, Problem solving, Attention                       Following Commands: Follows one step commands inconsistently, Follows one step commands with increased time Safety/Judgement: Decreased awareness of safety, Decreased awareness of deficits Awareness: Intellectual, Emergent Problem Solving: Slow processing, Difficulty sequencing, Decreased initiation, Requires verbal cues, Requires tactile cues General Comments: Patient with flat affect, but more communicative, max cues for impulsivity, but would correct, significant improvement in command following, and cues for pacing in order to attempt to communicate further        Exercises Other Exercises Other Exercises: Pt stood at sink using mirror for visual feedback for midline orientation & upright posture. Pt unable to extend R knee in standing, requires PT assistance to do so, cuing & facilitation for upright  posture. Pt progressed to performing standing weight shifts with assistance to facilitate.    Shoulder Instructions       General Comments wife at bedside, very engaged & appreciative of therapy efforts.    Pertinent Vitals/ Pain       Pain Assessment Pain Assessment: No/denies pain  Home Living                                          Prior Functioning/Environment              Frequency  Min 1X/week        Progress Toward Goals  OT Goals(current goals can now be found in the care plan section)  Progress towards OT goals: Progressing toward goals  Acute Rehab OT Goals Patient Stated Goal: to get to rehab (per wife) OT Goal Formulation: With family Time For Goal Achievement: 10/04/23 Potential to Achieve Goals: Good  Plan      Co-evaluation      Reason for Co-Treatment: Complexity of the patient's impairments (multi-system involvement);Necessary to address cognition/behavior during functional activity;For patient/therapist safety;To address functional/ADL transfers PT goals addressed during session: Mobility/safety with mobility;Balance;Strengthening/ROM OT goals addressed during session: ADL's and self-care;Proper use of [employer]ptive equipment and DME      AM-PAC OT "6 Clicks" Daily Activity     Outcome Measure   Help from another person eating meals?: A Lot Help from another person taking care of  personal grooming?: A Lot Help from another person toileting, which includes using toliet, bedpan, or urinal?: Total Help from another person bathing (including washing, rinsing, drying)?: A Lot Help from another person to put on and taking off regular upper body clothing?: A Lot Help from another person to put on and taking off regular lower body clothing?: Total 6 Click Score: 10    End of Session Equipment Utilized During Treatment: Gait belt;Rolling walker (2 wheels)  OT Visit Diagnosis: Unsteadiness on feet (R26.81);Other abnormalities of  gait and mobility (R26.89);Muscle weakness (generalized) (M62.81);Low vision, both eyes (H54.2);Other symptoms and signs involving the nervous system (R29.898);Other symptoms and signs involving cognitive function;Cognitive communication deficit (R41.841);Hemiplegia and hemiparesis Symptoms and signs involving cognitive functions: Cerebral infarction Hemiplegia - Right/Left: Right Hemiplegia - dominant/non-dominant: Dominant Hemiplegia - caused by: Cerebral infarction   Activity Tolerance Patient tolerated treatment well   Patient Left in chair;with call bell/phone within reach;with family/visitor present;Other (comment) (chair alarm not under patient)   Nurse Communication Mobility status        Time: 4098-1191 OT Time Calculation (min): 25 min  Charges: OT General Charges $OT Visit: 1 Visit OT Treatments $Self Care/Home Management : 8-22 mins  Pollyann Glen E. Ajit Errico, OTR/L Acute Rehabilitation Services [phone]   Cherlyn Cushing 09/25/2023, 11:53 AM

## 2023-09-26 DIAGNOSIS — I639 Cerebral infarction, unspecified: Secondary | ICD-10-CM | POA: Diagnosis not present

## 2023-09-26 DIAGNOSIS — T782XXA Anaphylactic shock, unspecified, initial encounter: Secondary | ICD-10-CM | POA: Diagnosis not present

## 2023-09-26 DIAGNOSIS — G9341 Metabolic encephalopathy: Secondary | ICD-10-CM | POA: Diagnosis not present

## 2023-09-26 LAB — GLUCOSE, CAPILLARY
Glucose-Capillary: 107 mg/dL — ABNORMAL HIGH (ref 70–99)
Glucose-Capillary: 109 mg/dL — ABNORMAL HIGH (ref 70–99)
Glucose-Capillary: 115 mg/dL — ABNORMAL HIGH (ref 70–99)
Glucose-Capillary: 147 mg/dL — ABNORMAL HIGH (ref 70–99)
Glucose-Capillary: 170 mg/dL — ABNORMAL HIGH (ref 70–99)
Glucose-Capillary: 201 mg/dL — ABNORMAL HIGH (ref 70–99)

## 2023-09-26 LAB — CULTURE, BLOOD (ROUTINE X 2)
Special Requests: ADEQUATE
Special Requests: ADEQUATE

## 2023-09-26 LAB — BASIC METABOLIC PANEL
Anion gap: 8 (ref 5–15)
BUN: 19 mg/dL (ref 6–20)
CO2: 21 mmol/L — ABNORMAL LOW (ref 22–32)
Calcium: 8 mg/dL — ABNORMAL LOW (ref 8.9–10.3)
Chloride: 105 mmol/L (ref 98–111)
Creatinine, Ser: 2.88 mg/dL — ABNORMAL HIGH (ref 0.61–1.24)
GFR, Estimated: 26 mL/min — ABNORMAL LOW (ref >60)
Glucose, Bld: 115 mg/dL — ABNORMAL HIGH (ref 70–99)
Potassium: 3.5 mmol/L (ref 3.5–5.1)
Sodium: 134 mmol/L — ABNORMAL LOW (ref 135–145)

## 2023-09-26 LAB — PHOSPHORUS: Phosphorus: 3 mg/dL (ref 2.5–4.6)

## 2023-09-26 LAB — CYCLOSPORINE: Cyclosporine, LabCorp: 19 ng/mL — ABNORMAL LOW (ref 100–400)

## 2023-09-26 LAB — MAGNESIUM: Magnesium: 1.8 mg/dL (ref 1.7–2.4)

## 2023-09-26 MED ORDER — SODIUM CHLORIDE 0.9 % IV SOLN
2.0000 g | INTRAVENOUS | Status: DC
  Administered 2023-09-26 – 2023-09-28 (×3): 2 g via INTRAVENOUS
  Filled 2023-09-26 (×3): qty 20

## 2023-09-26 MED ORDER — POTASSIUM CHLORIDE CRYS ER 20 MEQ PO TBCR
20.0000 meq | EXTENDED_RELEASE_TABLET | Freq: Once | ORAL | Status: AC
  Administered 2023-09-26: 20 meq via ORAL
  Filled 2023-09-26: qty 1

## 2023-09-26 MED ORDER — CHLORPROMAZINE HCL 10 MG PO TABS
10.0000 mg | ORAL_TABLET | Freq: Three times a day (TID) | ORAL | Status: DC | PRN
  Administered 2023-09-26: 10 mg via ORAL
  Filled 2023-09-26 (×2): qty 1

## 2023-09-26 MED ORDER — MELATONIN 3 MG PO TABS: 3.0000 mg | ORAL_TABLET | Freq: Every evening | ORAL | Status: DC | PRN

## 2023-09-26 NOTE — Progress Notes (Signed)
Inpatient Rehab Admissions Coordinator:    I have insurance auth for CIR. Nephrology feels he needs 1-2 days on acute. I will follow up tomorrow.   Megan Salon, MS, CCC-SLP Rehab Admissions Coordinator  [phone] (celll) [phone] (office)

## 2023-09-26 NOTE — Progress Notes (Signed)
[facility] Progress Note  Name: [name] MRN: [id number] DOB: 1976-11-17   Subjective:  Patient had 3.2 liters UOP over 9/30.  He has been on NS at 75 ml/hr.  He has a white board at bedside - has an encouraging message to him but he hasn't written anything yet  Review of systems:  Nods that has nausea Hx limited by known aphasia Denies shortness of breath or chest pain   Intake/Output Summary (Last 24 hours) at 09/26/2023 1019 Last data filed at 09/26/2023 0917 Gross per 24 hour  Intake 50 ml  Output 3350 ml  Net -3300 ml    Vitals:  Vitals:   09/26/23 0340 09/26/23 0549 09/26/23 0745 09/26/23 0900  BP: (!) 152/67  (!) 156/64 (!) 165/79  Pulse: 85  81 76  Resp: 18  18 18   Temp: 100 F (37.8 C)  99.2 F (37.3 C) 98.6 F (37 C)  TempSrc: Oral  Oral Oral  SpO2: 97%  96% 100%  Weight:  87.6 kg       Physical Exam:  General:  adult male in bed in NAD at rest   HEENT: NCAT  Eyes: EOMI sclera anicteric Neck: supple trachea midline  Heart: S1S2 no rub Lungs: clear and unlabored; room air  Abdomen: soft/nt/nd Extremities: bilateral BKA's no edema of residual limbs  Skin: no rash on extremities exposed Psych no anxiety or agitation  Neuro: awake and interactive with some word-finding difficulties  Medications reviewed   Labs:     Latest Ref Rng & Units 09/25/2023   10:18 AM 09/24/2023   11:07 AM 09/23/2023    7:33 AM  BMP  Glucose 70 - 99 mg/dL 409  811  914   BUN 6 - 20 mg/dL 21  21  16    Creatinine 0.61 - 1.24 mg/dL 7.82  9.56  2.13   Sodium 135 - 145 mmol/L 132  133  135   Potassium 3.5 - 5.1 mmol/L 3.4  3.5  3.0   Chloride 98 - 111 mmol/L 102  97  101   CO2 22 - 32 mmol/L 20  20  23    Calcium 8.9 - 10.3 mg/dL 8.0  8.0  8.0      Assessment/Plan:   # AKI  - may be hemodynamic/ATN in the setting of severe allergic reaction requiring intubation as well as pre-renal insults from gram negative bacteremia.  Transplant ultrasound  without hydro - baseline Cr per the [facility] system appears to be in the high 1's - Continue supportive care - hopeful for Cr plateau - he has a cyclosporine trough in process from 9/29 which does appear to be a true trough  - plan for additional 24 hours of NS at 75 ml/hr - check post-void residual bladder scan and in/out cath if indicated    # Acute ischemic infarct - neurology has seen  - may be in setting of severe hypotensive event per neuro  - Per neuro note: "Occluded left ICA with reconstitution of the communicating segment."    # Deceased donor renal transplant  - with AKI of allograft as above - reduced myfortic in setting of bacteremia    # Pancreas transplant - note hyperglycemia  - amylase and lipase ok  - glucose management per primary team    # E coli bacteremia - abx per primary team  - note cyclosporine was added.  Reduced myfortic    # HTN  - acceptable control    # Allergic  reaction - to belatacept  - note his regimen has been adjusted   # Hypokalemia - potassium chloride PO once    Please continue inpatient monitoring   Estanislado Emms, MD 09/26/2023 10:31 AM

## 2023-09-26 NOTE — Progress Notes (Addendum)
Speech Language Pathology Treatment: Dysphagia;Cognitive-Linquistic  Patient Details Name: [name] MRN: [id number] DOB: 03/21/1976 Today's Date: 09/26/2023 Time: 1027-2536 SLP Time Calculation (min) (ACUTE ONLY): 27 min  Assessment / Plan / Recommendation Clinical Impression  Pt reports no concerns with swallowing. Observed with trials of solids and thin liquids with no overt s/s of dysphagia or aspiration. Recommend upgrading diet to regular texture solids to allow increased options as pt's family frequently brings him desired food with no concerns. Will s/o for swallowing.  Pt reports continued difficulty with language, growing understandably emotional throughout the session due to his frustration with the inability to express himself. Provided counseling and education regarding aphasia and therapy goals. Pt expressed a fierce determination to work hard with therapy to improve communication. He states he has been unsuccessful with writing. Pt functionally able to count to ten this date, although with repetitions and phonemic paraphasias. He was unsuccessful with singing both the alphabet and "happy birthday" given a model and verbal cueing. Attempted to assess pt's reading with pt seemingly neglecting words at the beginning of each sentence (on the L side of the paper). Pt able to read majority of each phrase or sentence with effort. Noted increased effort and groping-like movements with attempts at verbal communication. Pt not consistently stimulable with use of model or phonemic cues. He has excellent awareness of verbal errors and makes multiple attempts at self-correction, although is not consistently able to do so. He may be a good candidate for use of an aug com device to supplement his oral communication. Will continue to follow to target cognitive linguistic goals as able.    HPI HPI: [name] is a 47 yo male presenting to ED 9/23 with AMS. Pt found to be aphasic with a  facial droop and a code stroke was activated. Intubated 9/23-9/24. MRI Brain with scattered small foci of acute subcortical infarction in L occipital lobe and posterior L frontal lobe and occluded L ICA. Recently admitted 9/9-9/11 with findings of E coli UTI and poorly controlled HTN. PMH includes HTN, T2DM, PAD s/p bilateral BKA, s/p renal and pancreatic transplant on chronic immunosuppressive therapy, GERD      SLP Plan  Continue with current plan of care      Recommendations for follow up therapy are one component of a multi-disciplinary discharge planning process, led by the attending physician.  Recommendations may be updated based on patient status, additional functional criteria and insurance authorization.    Recommendations  Diet recommendations: Regular;Thin liquid Liquids provided via: Cup;Straw Medication Administration: Whole meds with puree Supervision: Staff to assist with self feeding Compensations: Minimize environmental distractions;Slow rate;Small sips/bites;Follow solids with liquid Postural Changes and/or Swallow Maneuvers: Seated upright 90 degrees;Upright 30-60 min after meal                  Oral care BID   Frequent or constant Supervision/Assistance Aphasia (R47.01);Cognitive communication deficit (R41.841);Dysphagia, oropharyngeal phase (R13.12)     Continue with current plan of care     Gwynneth Aliment, M.A., CF-SLP Speech Language Pathology, Acute Rehabilitation Services  Secure Chat preferred [phone]   09/26/2023, 2:46 PM

## 2023-09-26 NOTE — Progress Notes (Signed)
Physical Therapy Treatment Patient Details Name: [name] MRN: [id number] DOB: [birth year] Today's Date: 09/26/2023   History of Present Illness Pt is a 47 y.o. male who presented 09/18/23 from infusion center where he was receiving his Belatacept infusion for chronic immunosuppressive therapy and during his infusion he became hypotensive and had full body burning sensation. Pt received IM epinephrine on ED arrival with improvement in symptoms but noted difficulty speaking and facial droop. Code stroke activated. Imaging revealed scattered small foci of acute subcortical infarction in the left occipital lobe and posterior left frontal lobe and occluded left ICA. PMH includes HTN, DMII, PAD s/p bilateral BKA, renal and pancreatic transplant, depression, GERD.    PT Comments  Pt motivated and making gradual progress.  Pt requiring mod A bed mobility then mod x 2 to stand to STEDY then min A of 2 for safety. Pt participating with multiple STS and standing weight shifting with therapist assist R UE and R knee ext.   Pt with decreased awareness of R sided deficits -in sitting pt trying to assist to don prostheses and would lean/fall hard to R requiring assist to balance.  Needs cues for safety and transfer technique.  Pt was OOB a few hours earlier and fatigued so returned to supine , made sure R knee positioned in ext. Continue POC with recommendation Patient will benefit from intensive inpatient follow up therapy, >3 hours/day    If plan is discharge home, recommend the following: Assist for transportation;Two people to help with walking and/or transfers;Two people to help with bathing/dressing/bathroom;Assistance with cooking/housework;Direct supervision/assist for medications management;Direct supervision/assist for financial management;Help with stairs or ramp for entrance   Can travel by private vehicle        Equipment Recommendations  Other (comment) (defer to post acute)     Recommendations for Other Services       Precautions / Restrictions Precautions Precautions: Fall;Other (comment) Precaution Comments: Bil BKA (prosthesis in room), R hemi, avoid hypotension     Mobility  Bed Mobility Overal bed mobility: Needs Assistance Bed Mobility: Supine to Sit Rolling: Min assist   Supine to sit: Mod assist Sit to supine: Mod assist   General bed mobility comments: Pt motivated and trying to do what he could on his own with use of L UE.  He could roll to R with mod I but min A to L.  Cues for technique to roll to L then push up with L UE, does require mod A to prevent falling back down when shifting L UE position    Transfers Overall transfer level: Needs assistance Equipment used:  (STEDY) Transfers: Sit to/from Stand Sit to Stand: Mod assist, +2 physical assistance           General transfer comment: Pt performed STS x 3 into STEDY with heavy dependency on L UE and LE, but light mod A of 2 from PT and tech.  PT assisting on R side to provide support to UE then straighten R knee. Pt in chair earlier for a few hours, reports fatigued, returned to supine.    Ambulation/Gait                   Stairs             Wheelchair Mobility     Tilt Bed    Modified Rankin (Stroke Patients Only) Modified Rankin (Stroke Patients Only) Pre-Morbid Rankin Score: No symptoms Modified Rankin: Severe disability     Balance Overall balance  assessment: Needs assistance Sitting-balance support: Feet supported, Feet unsupported, Single extremity supported Sitting balance-Leahy Scale: Poor Sitting balance - Comments: When pt using L UE only needing close supervision and maintains midline; however, when trying to don prosthesis and pt wanting/trying to assist with L UE he would quickly fall to the R and unable to correct requiring max A.  Therapist sat down on pts R to provide mod/max A when donning prosthesis.  Pt did fairly well donning L  prosthesis with min A but needing mod A on R side. EOB 8-10 mins prior to standing. Postural control: Right lateral lean Standing balance support: Single extremity supported Standing balance-Leahy Scale: Poor Standing balance comment: Stood in STEDY for 1-2 mins x 2.  Pt holding on with L UE, therapist placed R hand on STEDY and provided support to stabilize at elbow.  Worked on weight shifting 15x2, allowing R knee to flex and therapist would assist to straighten.  With UE support pt needing min A of 2 for safety to balance in STEDY                            Cognition Arousal: Alert Behavior During Therapy: Flat affect Overall Cognitive Status: Difficult to assess                     Current Attention Level: Selective Memory: Decreased recall of precautions Following Commands: Follows one step commands with increased time, Follows multi-step commands inconsistently Safety/Judgement: Decreased awareness of deficits, Decreased awareness of safety Awareness: Emergent Problem Solving: Slow processing, Difficulty sequencing, Decreased initiation, Requires verbal cues, Requires tactile cues General Comments: Pt with some impulsiveness, decreased awareness of deficits, and R inattention.  When sitting pt would let go with L hand to try to assist with prostheses and fall hard/quick to R requiring assist to maintain balance.  Pt did say "yes and no" appropriately during session.  He would attempt to say more but only able to get out 1-2 words then aphasic/word finding difficulty. Did say "I want to try" multiple times        Exercises Other Exercises Other Exercises: R knee ext stretch 30 sec x 3    General Comments General comments (skin integrity, edema, etc.): VSS.      Pertinent Vitals/Pain Pain Assessment Pain Assessment: No/denies pain    Home Living                          Prior Function            PT Goals (current goals can now be found in  the care plan section) Progress towards PT goals: Progressing toward goals    Frequency    Min 1X/week      PT Plan      Co-evaluation              AM-PAC PT "6 Clicks" Mobility   Outcome Measure  Help needed turning from your back to your side while in a flat bed without using bedrails?: A Lot Help needed moving from lying on your back to sitting on the side of a flat bed without using bedrails?: A Lot Help needed moving to and from a bed to a chair (including a wheelchair)?: Total Help needed standing up from a chair using your arms (e.g., wheelchair or bedside chair)?: Total Help needed to walk in hospital room?: Total Help needed climbing 3-5  steps with a railing? : Total 6 Click Score: 8    End of Session Equipment Utilized During Treatment: Gait belt Activity Tolerance: Patient tolerated treatment well Patient left: in bed;with call bell/phone within reach (Bed alarm would not set, stating weight too low, notified RN (reports pt doesn't try to get OOB but she will f/u)) Nurse Communication: Mobility status PT Visit Diagnosis: Other symptoms and signs involving the nervous system (R29.898);Unsteadiness on feet (R26.81);Other abnormalities of gait and mobility (R26.89);Muscle weakness (generalized) (M62.81);Difficulty in walking, not elsewhere classified (R26.2);Hemiplegia and hemiparesis Hemiplegia - Right/Left: Right Hemiplegia - dominant/non-dominant: Dominant Hemiplegia - caused by: Cerebral infarction     Time: 4098-1191 PT Time Calculation (min) (ACUTE ONLY): 32 min  Charges:    $Therapeutic Activity: 8-22 mins $Neuromuscular Re-education: 8-22 mins PT General Charges $$ ACUTE PT VISIT: 1 Visit                     Anise Salvo, PT Acute Rehab [facility] [phone]    Rayetta Humphrey 09/26/2023, 5:02 PM

## 2023-09-26 NOTE — Progress Notes (Addendum)
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 -Currently on aspirin 81 mg and Plavix 75 mg daily for 3 weeks, then aspirin alone  Bacteremia -Developed fever 102 F, in setting of immunosuppression for renal and pancreas transplant -Vitals are stable -Blood culture growing gram-negative rods, ID showed E. coli -Will continue with Zosyn -Vancomycin was discontinued -Urine culture growing E. coli  Nausea/retching -Restart Reglan 10 mg p.o. 3 times daily which patient takes at home -Continue pantoprazole 40 mg p.o. twice daily -Start chlorpromazine 10 mg p.o.3 times daily as needed hiccups  Acute respiratory failure - r/t inadequate airway protection in setting anaphylactic reaction and stroke  -Extubated -Speech therapy evaluation done, started on dysphagia 3 diet with thin liquid   ? Anaphylactic reaction -  Patient was getting his Belatacept infusion and became acutely altered and hypotensive.   Given Benadryl for potential allergic reaction and was sent to [facility] emergency department.   -Patient was given epinephrine here, and patient was subsequently  intubated for altered mental status and inability to protect airway.  -Currently on prednisone 5 mg daily -Tryptase level was elevated at 64.2; repeat tryptase level is 8.3 which is within normal range confirming that this was anaphylactic reaction  Erythrocytosis  - suspect r/t hemoconcentration, improved.    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  Baseline Scr ~1.6-1.8 -Serum creatinine went up to 3.07 -Improved with IV hydration, creatinine down to 2.88 -Continue IV hydration with NS at 75 ml/hr -continue home prednisone 5 mg daily -continue home mycophenolate 360 mg twice daily -Continue cyclosporine - Belatacept has been discontinued due to anaphylactic reaction as above, Dr. Kathrene Bongo discussed with transplant medicine at [facility] and they recommended to hold this medication and start cyclosporine.  Cyclosporine has been started. -Nephrology consulted; renal transplant ultrasound showed no acute abnormality   #PAD status post bilateral BKA Patient has a past medical history of PAD.  Patient status post bilateral BKA.   P:  -cont home Crestor 10 mg daily   Abnormal TSH  - no known hx thyroid disease.  TSH 1.04 06/2023 now 7.941 on 9/23 > 9/24 0.97 T4 1.28  Hypertension -Continue Coreg, hydralazine -Avoid hypotension  Hypokalemia -Replete   Data Reviewed:   CBG:  Recent Labs  Lab 09/25/23 2019 09/25/23 2340 09/26/23 0341 09/26/23 0741 09/26/23 1216  GLUCAP 231* 70 107* 109* 170*    SpO2: 96 % O2 Flow Rate (L/min): 2 L/min FiO2 (%): 30 %    Vitals:   09/26/23 0549 09/26/23 0745 09/26/23 0900 09/26/23 1232  BP:  (!) 156/64 (!) 165/79 136/67  Pulse:  81 76 78  Resp:  18 18 18   Temp:  99.2 F (37.3 C) 98.6 F (37 C) 98.1 F (36.7 C)  TempSrc:   Oral Oral Oral  SpO2:  96% 100% 96%  Weight: 87.6 kg         Data Reviewed:  Basic Metabolic Panel: Recent Labs  Lab 09/22/23 1559 09/23/23 0733 09/24/23 1107 09/25/23 0708 09/25/23 1018 09/26/23 0525 09/26/23 1104  NA 136 135 133*  --  132*  --  134*  K 3.4* 3.0* 3.5  --  3.4*  --  3.5  CL 99 101 97*  --  102  --  105  CO2 24 23 20*  --  20*  --  21*  GLUCOSE 108* 113* 160*  --  199*  --  115*  BUN 14 16 21*  --  21*  --  19  CREATININE 1.73* 2.37* 2.93*  --  3.07*  --  2.88*  CALCIUM 8.0* 8.0* 8.0*  --  8.0*  --  8.0*  MG 1.9 1.8 1.9 1.8  --  1.8  --   PHOS 2.9 2.5 2.7 3.4  --  3.0  --     CBC: Recent Labs  Lab 09/21/23 1033 09/22/23 1559 09/23/23 0733 09/24/23 1107 09/25/23 1018  WBC 11.1* 8.0 13.4* 13.9* 9.7  HGB 13.7 14.6 14.0 12.0* 12.6*  HCT 42.3 45.3 42.7 37.0* 39.6  MCV 95.7 98.5 96.8 94.4 95.0  PLT 214 189 201 201 199    LFT Recent Labs  Lab 09/24/23 1107  AST 21  ALT 12  ALKPHOS 62  BILITOT 0.7  PROT 5.5*  ALBUMIN 2.0*     Antibiotics: Anti-infectives (From admission, onward)    Start     Dose/Rate Route Frequency Ordered Stop   09/24/23 1300  vancomycin (VANCOCIN) IVPB 1000 mg/200 mL premix        1,000 mg 200 mL/hr over 60 Minutes Intravenous Every 24 hours 09/23/23 1224 09/24/23 1309   09/23/23 1300  piperacillin-tazobactam (ZOSYN) IVPB 3.375 g        3.375 g 12.5 mL/hr over 240 Minutes Intravenous Every 8 hours 09/23/23 1200     09/23/23 1300  vancomycin (VANCOREADY) IVPB 1500 mg/300 mL        1,500 mg 150 mL/hr over 120 Minutes Intravenous  Once 09/23/23 1200 09/23/23 1554   09/20/23 1045  sulfamethoxazole-trimethoprim (BACTRIM) 400-80 MG per tablet 1 tablet        1 tablet Oral Every M-W-F 09/20/23 0952     09/20/23 1000  sulfamethoxazole-trimethoprim (BACTRIM) 400-80 MG per tablet 1 tablet  Status:  Discontinued        1 tablet Per Tube Every M-W-F 09/18/23 1705 09/20/23 0952   09/18/23 1700  sulfamethoxazole-trimethoprim  (BACTRIM) 400-80 MG per tablet 1 tablet  Status:  Discontinued        1 tablet Oral Every M-W-F 09/18/23 1659 09/18/23 1705        DVT prophylaxis: Lovenox  Code Status: Full code  Family Communication: Discussed with patient's wife at bedside   CONSULTS PCCM, neurology   Subjective   Denies any complaints.  Objective    Physical Examination:  Alert, in no acute distress S1-S2, regular Lungs clear to auscultation bilaterally Abdomen is soft, nontender, no organomegaly Neuro-alert, right hemiparesis  Status is: Inpatient:          [name]   [facility] If 7PM-7AM, please contact night-coverage at [email or web address], Office  [phone]   09/26/2023, 2:50 PM  LOS:  8 days

## 2023-09-27 DIAGNOSIS — I639 Cerebral infarction, unspecified: Secondary | ICD-10-CM | POA: Diagnosis not present

## 2023-09-27 DIAGNOSIS — T782XXA Anaphylactic shock, unspecified, initial encounter: Secondary | ICD-10-CM | POA: Diagnosis not present

## 2023-09-27 DIAGNOSIS — G9341 Metabolic encephalopathy: Secondary | ICD-10-CM | POA: Diagnosis not present

## 2023-09-27 LAB — GLUCOSE, CAPILLARY
Glucose-Capillary: 121 mg/dL — ABNORMAL HIGH (ref 70–99)
Glucose-Capillary: 205 mg/dL — ABNORMAL HIGH (ref 70–99)
Glucose-Capillary: 108 mg/dL — ABNORMAL HIGH (ref 70–99)
Glucose-Capillary: 146 mg/dL — ABNORMAL HIGH (ref 70–99)
Glucose-Capillary: 218 mg/dL — ABNORMAL HIGH (ref 70–99)

## 2023-09-27 LAB — BASIC METABOLIC PANEL
Anion gap: 9 (ref 5–15)
BUN: 18 mg/dL (ref 6–20)
CO2: 19 mmol/L — ABNORMAL LOW (ref 22–32)
Calcium: 8 mg/dL — ABNORMAL LOW (ref 8.9–10.3)
Chloride: 107 mmol/L (ref 98–111)
Creatinine, Ser: 2.9 mg/dL — ABNORMAL HIGH (ref 0.61–1.24)
GFR, Estimated: 26 mL/min — ABNORMAL LOW (ref >60)
Glucose, Bld: 119 mg/dL — ABNORMAL HIGH (ref 70–99)
Potassium: 3.4 mmol/L — ABNORMAL LOW (ref 3.5–5.1)
Sodium: 135 mmol/L (ref 135–145)

## 2023-09-27 LAB — PHOSPHORUS: Phosphorus: 3 mg/dL (ref 2.5–4.6)

## 2023-09-27 LAB — MAGNESIUM: Magnesium: 1.9 mg/dL (ref 1.7–2.4)

## 2023-09-27 MED ORDER — HYDRALAZINE HCL 50 MG PO TABS
50.0000 mg | ORAL_TABLET | Freq: Two times a day (BID) | ORAL | Status: DC
  Administered 2023-09-27 – 2023-09-29 (×4): 50 mg via ORAL
  Filled 2023-09-27 (×4): qty 1

## 2023-09-27 MED ORDER — CYCLOSPORINE MODIFIED (GENGRAF) 25 MG PO CAPS
100.0000 mg | ORAL_CAPSULE | Freq: Two times a day (BID) | ORAL | Status: DC
  Administered 2023-09-27 – 2023-09-29 (×4): 100 mg via ORAL
  Filled 2023-09-27 (×5): qty 4

## 2023-09-27 MED ORDER — CARVEDILOL 6.25 MG PO TABS
6.2500 mg | ORAL_TABLET | Freq: Two times a day (BID) | ORAL | Status: DC
  Administered 2023-09-27 – 2023-09-29 (×5): 6.25 mg via ORAL
  Filled 2023-09-27 (×5): qty 1

## 2023-09-27 MED ORDER — HYDRALAZINE HCL 50 MG PO TABS: 50.0000 mg | ORAL_TABLET | Freq: Three times a day (TID) | ORAL | Status: DC

## 2023-09-27 MED ORDER — CARVEDILOL 12.5 MG PO TABS: 12.5000 mg | ORAL_TABLET | Freq: Two times a day (BID) | ORAL | Status: DC

## 2023-09-27 MED ORDER — POTASSIUM CHLORIDE CRYS ER 20 MEQ PO TBCR
40.0000 meq | EXTENDED_RELEASE_TABLET | Freq: Once | ORAL | Status: AC
  Administered 2023-09-27: 40 meq via ORAL
  Filled 2023-09-27: qty 2

## 2023-09-27 NOTE — Hospital Course (Addendum)
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  Patient could not protect airway so was intubated and was admitted ICU.  Subsequently extubated and was transferred to medical service  Assessment and plan.  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  Neurology was consulted. MRI brain showed scattered small foci of acute subcortical infarction in the left occipital lobe and posterior left frontal lobe.  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  Currently on aspirin and Plavix for 3 weeks then aspirin alone.  E. coli bacteremia E. coli UTI. On the the background of immunosuppression for renal and pancreatic transplant.  Continue IV Zosyn.  Temperature max of 99 F at this time.  Nausea/retching On Reglan pantoprazole chlorpromazine.   Acute respiratory failure -due to inadequate airway protection in the setting of anaphylactic reaction and stroke.  Patient was initially intubated on mechanical ventilator.  Currently extubated on dysphagia 3 diet.  Protecting airway.   Anaphylactic reaction to Belatacept infusion.  Patient was hypotensive with altered reaction and required epinephrine.  Needed to be intubated for airway protection.  On prednisone.  To previous level was elevated and repeat level improved with indicated anaphylaxis.   Hyponatremia Improved.  Latest sodium of 135.   Hypokalemia Will continue to replenish orally.  Latest potassium of 3.4.   Hypophosphatemia  Replenished and improved.  Latest phosphorus of 3.0.   None anion gap metabolic acidosis. CO2 of 19.   Continue to monitor.   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  Creatinine today at 2.9.  On normal saline 75 mL/h.  Continue prednisone and mycophenolate and cyclosporine. Belatacept has been discontinued due to anaphylactic. Dr. Kathrene Bongo discussed with transplant medicine at [facility] and they recommended cyclosporine instead.  Ultrasound of the kidneys shows renal transplant without any acute abnormality.  PAD status post bilateral BKA Continue Crestor.   Abnormal TSH  Latest TSH of 0.9 and within normal range.   Essential Hypertension -Continue Coreg, hydralazine.  Blood pressure seems to be elevated at this time.  Nephrology increased dose of hydralazine.   Hypokalemia Still mildly low.  Will continue to replenish.  Check levels in AM.  Potassium today at 3.4.  Deconditioning, debility.  PT has seen the patient and recommend CIR.

## 2023-09-27 NOTE — Plan of Care (Signed)

## 2023-09-27 NOTE — Progress Notes (Signed)
PT Cancellation Note  Patient Details Name: [name] MRN: [id number] DOB: June 01, 1976   Cancelled Treatment:    Reason Eval/Treat Not Completed: Other (comment)  Patient crying on arrival. Would not make eye contact or talk to therapist or technician. Offered opportunities to verbalize what is wrong with no response from pt. RN made aware.    [name], PT Acute Rehabilitation Services  Office [phone]   [name] 09/27/2023, 10:20 AM

## 2023-09-27 NOTE — Progress Notes (Addendum)
[facility] Progress Note  Name: [name] MRN: [id number] DOB: December 06, 1976   Subjective:  Patient had 2.6 liters UOP over 10/1 as well as one unmeasured urine void.  He has been on NS at 75 ml/hr.  No post-void residual bladder scan available yet.  His wife is concerned that he holding onto his urine.  He has letter and alphabet flashcards at his bedside.  Spoke with his wife via phone to update her.    Review of systems:   Nausea is better Hx limited by known aphasia Denies shortness of breath or chest pain   Intake/Output Summary (Last 24 hours) at 09/27/2023 0848 Last data filed at 09/27/2023 0811 Gross per 24 hour  Intake 968.88 ml  Output 3200 ml  Net -2231.12 ml    Vitals:  Vitals:   09/26/23 1959 09/26/23 2343 09/27/23 0328 09/27/23 0809  BP: (!) 147/72 (!) 158/79 (!) 177/74 (!) 193/81  Pulse: 71 80 88 76  Resp: 18 18 16 17   Temp: 97.6 F (36.4 C) 99 F (37.2 C) 98.7 F (37.1 C) 98.2 F (36.8 C)  TempSrc: Oral Oral Oral Oral  SpO2: 99% 95% 95% 100%  Weight:         Physical Exam:   General:  adult male in bed in NAD at rest   HEENT: NCAT  Eyes: EOMI sclera anicteric Neck: supple trachea midline  Heart: S1S2 no rub Lungs: clear and unlabored; room air  Abdomen: soft/nt/nd Extremities: bilateral BKA's no edema of residual limbs  Skin: no rash on extremities exposed Psych no anxiety or agitation  Neuro: awake and interactive with some word-finding difficulties; nods or shakes head more often than speaks   Medications reviewed   Labs:     Latest Ref Rng & Units 09/27/2023    5:08 AM 09/26/2023   11:04 AM 09/25/2023   10:18 AM  BMP  Glucose 70 - 99 mg/dL 253  664  403   BUN 6 - 20 mg/dL 18  19  21    Creatinine 0.61 - 1.24 mg/dL 4.74  2.59  5.63   Sodium 135 - 145 mmol/L 135  134  132   Potassium 3.5 - 5.1 mmol/L 3.4  3.5  3.4   Chloride 98 - 111 mmol/L 107  105  102   CO2 22 - 32 mmol/L 19  21  20    Calcium 8.9 - 10.3 mg/dL 8.0   8.0  8.0      Assessment/Plan:   # AKI  - may be hemodynamic/ischemic ATN in the setting of severe allergic reaction requiring intubation as well as pre-renal insults from gram negative bacteremia.  Transplant ultrasound without hydro - baseline Cr per the [facility] system appears to be in the high 1's - Continue supportive care - creatinine has plateaued - he has a cyclosporine trough from 9/29 which does appear to be a true trough.  Level is quite low.  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."    # Deceased donor renal transplant  - with AKI of allograft as above - reduced myfortic in setting of bacteremia    #  Pancreas transplant - note hyperglycemia  - amylase and lipase ok  - glucose management per primary team    # E coli bacteremia - abx per primary team  - Reduced myfortic given bacteremia    # HTN  - hypertension  - increase hydralazine to 50 mg TID - stop normal saline    # Allergic reaction - to belatacept  - note his regimen has been adjusted   # Hypokalemia - he has already gotten a dose of potassium this AM   Please continue inpatient monitoring   Estanislado Emms, MD 09/27/2023 9:27 AM   Addendum - given the presentation with stroke, I reached out to his primary team about blood pressure changes and would defer to them   Estanislado Emms, MD 9:28 AM 09/27/2023

## 2023-09-27 NOTE — Progress Notes (Signed)
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  Patient could not protect airway so was intubated and was admitted ICU.  Subsequently extubated and was transferred to medical service  Assessment and plan.  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  Neurology was consulted. MRI brain showed scattered small foci of acute subcortical infarction in the left occipital lobe and posterior left frontal lobe.  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  Currently on aspirin and Plavix for 3 weeks then aspirin alone.  Has aphasia.  Will need rehabilitation.  E. coli bacteremia E. coli UTI. On the the background of immunosuppression for renal and pancreatic transplant.  Continue IV Zosyn.  Temperature max of 99 F at this time.  Nausea/retching On Reglan pantoprazole chlorpromazine.  Denies vomiting today.   Acute respiratory failure -due to inadequate airway protection in the setting of anaphylactic reaction and stroke.  Patient was initially intubated on mechanical ventilator.  Currently extubated on dysphagia 3 diet.  Protecting airway.   Anaphylactic reaction to Belatacept infusion.  Patient was hypotensive with altered reaction and required epinephrine.  Needed to be intubated for airway protection.  On prednisone.  To previous level was elevated and repeat level improved with indicated anaphylaxis.   Hyponatremia Improved.  Latest sodium of 135.    Hypokalemia Will continue to replenish orally.  Latest potassium of 3.4.  Check BMP in AM.   Hypophosphatemia  Replenished and improved.  Latest phosphorus of 3.0.   None anion gap metabolic acidosis. CO2 of 19.  Continue to monitor.   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  Creatinine today at 2.9.  On normal saline 75 mL/h.  Continue prednisone and mycophenolate and cyclosporine. Belatacept has been discontinued due to anaphylactic. Dr. Kathrene Bongo discussed with transplant medicine at [facility] and they recommended cyclosporine instead.  Ultrasound of the kidneys shows renal transplant without any acute abnormality.  Patient required In-N-Out catheter for urinary retention.  Will need bladder scans.  PAD status post bilateral BKA Continue Crestor.   Abnormal TSH  Latest TSH of 0.9 and within normal range.   Essential Hypertension -Continue Coreg, hydralazine.  Blood pressure seems to be elevated at this time.  Was recently increased on the dose of Coreg.   Deconditioning, debility.  PT has seen the patient and recommend CIR.      DVT prophylaxis: enoxaparin (LOVENOX) injection 40 mg Start: 09/19/23 1015   Code Status:     Code Status: Full Code  Disposition: CIR  Status is: Inpatient Remains inpatient appropriate because: Clinical improvement, need for CIR   Family Communication: None at bedside.  Consultants:  Nephrology PCCM Neurology  Procedures:  Intubation, mechanical ventilation and extubation,  Antimicrobials:  Rocephin IV  Anti-infectives (From admission, onward)    Start     Dose/Rate Route Frequency Ordered Stop   09/26/23 2200  cefTRIAXone (ROCEPHIN) 2 g in sodium chloride 0.9 % 100 mL IVPB        2 g 200 mL/hr  over 30 Minutes Intravenous Every 24 hours 09/26/23 1517     09/24/23 1300  vancomycin (VANCOCIN) IVPB 1000 mg/200 mL premix        1,000 mg 200 mL/hr over 60 Minutes  Intravenous Every 24 hours 09/23/23 1224 09/24/23 1309   09/23/23 1300  piperacillin-tazobactam (ZOSYN) IVPB 3.375 g  Status:  Discontinued        3.375 g 12.5 mL/hr over 240 Minutes Intravenous Every 8 hours 09/23/23 1200 09/26/23 1517   09/23/23 1300  vancomycin (VANCOREADY) IVPB 1500 mg/300 mL        1,500 mg 150 mL/hr over 120 Minutes Intravenous  Once 09/23/23 1200 09/23/23 1554   09/20/23 1045  sulfamethoxazole-trimethoprim (BACTRIM) 400-80 MG per tablet 1 tablet        1 tablet Oral Every M-W-F 09/20/23 0952     09/20/23 1000  sulfamethoxazole-trimethoprim (BACTRIM) 400-80 MG per tablet 1 tablet  Status:  Discontinued        1 tablet Per Tube Every M-W-F 09/18/23 1705 09/20/23 0952   09/18/23 1700  sulfamethoxazole-trimethoprim (BACTRIM) 400-80 MG per tablet 1 tablet  Status:  Discontinued        1 tablet Oral Every M-W-F 09/18/23 1659 09/18/23 1705       Subjective: Today, patient was seen and examined at bedside.  Temperature max of 99 F.  Interactive and tearful.  Has aphasia.  Responding to yes or no.  Nursing staff reported needing bladder scan and In-N-Out catheter for urinary retention.  Objective: Vitals:   09/26/23 1959 09/26/23 2343 09/27/23 0328 09/27/23 0809  BP: (!) 147/72 (!) 158/79 (!) 177/74 (!) 193/81  Pulse: 71 80 88 76  Resp: 18 18 16 17   Temp: 97.6 F (36.4 C) 99 F (37.2 C) 98.7 F (37.1 C) 98.2 F (36.8 C)  TempSrc: Oral Oral Oral Oral  SpO2: 99% 95% 95% 100%  Weight:        Intake/Output Summary (Last 24 hours) at 09/27/2023 0916 Last data filed at 09/27/2023 0811 Gross per 24 hour  Intake 968.88 ml  Output 3200 ml  Net -2231.12 ml   Filed Weights   09/23/23 0439 09/24/23 0500 09/26/23 0549  Weight: 83.5 kg 83.2 kg 87.6 kg    Physical Examination: Body mass index is 27.71 kg/m.   General:  Average built, not in obvious distress HENT:   No scleral pallor or icterus noted. Oral mucosa is moist.  Chest: Diminished breath sounds  bilaterally. No crackles or wheezes.  CVS: S1 &S2 heard. No murmur.  Regular rate and rhythm. Abdomen: Soft, nontender, nondistended.  Bowel sounds are heard.  External urinary catheter in place. Extremities: Bilateral BKA Psych: Alert, awake but minimally interactive.  Has aphasia.  Appears tearful.  Flat affect. CNS:  No cranial nerve deficits.   Skin: Warm and dry.    Data Reviewed:   CBC: Recent Labs  Lab 09/21/23 1033 09/22/23 1559 09/23/23 0733 09/24/23 1107 09/25/23 1018  WBC 11.1* 8.0 13.4* 13.9* 9.7  HGB 13.7 14.6 14.0 12.0* 12.6*  HCT 42.3 45.3 42.7 37.0* 39.6  MCV 95.7 98.5 96.8 94.4 95.0  PLT 214 189 201 201 199    Basic Metabolic Panel: Recent Labs  Lab 09/23/23 0733 09/24/23 1107 09/25/23 0708 09/25/23 1018 09/26/23 0525 09/26/23 1104 09/27/23 0508  NA 135 133*  --  132*  --  134* 135  K 3.0* 3.5  --  3.4*  --  3.5 3.4*  CL 101 97*  --  102  --  105 107  CO2 23 20*  --  20*  --  21* 19*  GLUCOSE 113* 160*  --  199*  --  115* 119*  BUN 16 21*  --  21*  --  19 18  CREATININE 2.37* 2.93*  --  3.07*  --  2.88* 2.90*  CALCIUM 8.0* 8.0*  --  8.0*  --  8.0* 8.0*  MG 1.8 1.9 1.8  --  1.8  --  1.9  PHOS 2.5 2.7 3.4  --  3.0  --  3.0    Liver Function Tests: Recent Labs  Lab 09/24/23 1107  AST 21  ALT 12  ALKPHOS 62  BILITOT 0.7  PROT 5.5*  ALBUMIN 2.0*     Radiology Studies: US Renal Transplant w/Doppler  Result Date: 09/26/2023 CLINICAL DATA:  Acute kidney injury. EXAM: ULTRASOUND OF RENAL TRANSPLANT WITH RENAL DOPPLER ULTRASOUND TECHNIQUE: Ultrasound examination of the renal transplant was performed with gray-scale, color and duplex doppler evaluation. COMPARISON:  09/04/2023 FINDINGS: Transplant kidney location: Right lower quadrant Transplant Kidney: Renal measurements: 12.3 x 6.3 x 8.7 cm = volume: . Normal in size and parenchymal echogenicity. No evidence of mass or hydronephrosis. No peri-transplant fluid collection seen. Color flow in  the main renal artery:  Yes Color flow in the main renal vein:  Yes Duplex Doppler Evaluation: Main Renal Artery Resistive Index: 0.8 Venous waveform in main renal vein:  Present Intrarenal resistive index in upper pole:  0.7 (normal 0.6-0.8; equivocal 0.8-0.9; abnormal >= 0.9) Intrarenal resistive index in lower pole: 0.6 (normal 0.6-0.8; equivocal 0.8-0.9; abnormal >= 0.9) Bladder: Normal for degree of bladder distention. Other findings:  None. IMPRESSION: Right lower quadrant renal transplant.  No hydronephrosis. Patent transplant vasculature. No significant change since prior study. Electronically Signed   By: Charlett Nose M.D.   On: 09/26/2023 01:46      LOS: 9 days     Joycelyn Das, MD [facility] Available via Epic secure chat 7am-7pm After these hours, please refer to coverage provider listed on [email or web address] 09/27/2023, 9:16 AM

## 2023-09-27 NOTE — Progress Notes (Signed)
[name] has mild dysarthria that was not charted on previous NIHSS exam, and his pupils are different from what was charted on his prior exam that was done yesterday. Right pupil is irregular in shape, sluggish and 2mm, left pupil is regular shape, 2mm and sluggish. His wife is nurse at [employer], she just left and she was not concerned about any changes as far as speech, so this could be his baseline. Also in rubric for speech assessment it was noted previously slurred speech.  Howerter, MD notified.

## 2023-09-28 DIAGNOSIS — T782XXA Anaphylactic shock, unspecified, initial encounter: Secondary | ICD-10-CM | POA: Diagnosis not present

## 2023-09-28 DIAGNOSIS — G9341 Metabolic encephalopathy: Secondary | ICD-10-CM | POA: Diagnosis not present

## 2023-09-28 DIAGNOSIS — I639 Cerebral infarction, unspecified: Secondary | ICD-10-CM | POA: Diagnosis not present

## 2023-09-28 LAB — CBC
HCT: 39.3 % (ref 39.0–52.0)
Hemoglobin: 12.7 g/dL — ABNORMAL LOW (ref 13.0–17.0)
MCH: 30.4 pg (ref 26.0–34.0)
MCHC: 32.3 g/dL (ref 30.0–36.0)
MCV: 94 fL (ref 80.0–100.0)
Platelets: 273 10*3/uL (ref 150–400)
RBC: 4.18 MIL/uL — ABNORMAL LOW (ref 4.22–5.81)
RDW: 16.3 % — ABNORMAL HIGH (ref 11.5–15.5)
WBC: 7.1 10*3/uL (ref 4.0–10.5)
nRBC: 0 % (ref 0.0–0.2)

## 2023-09-28 LAB — GLUCOSE, CAPILLARY
Glucose-Capillary: 106 mg/dL — ABNORMAL HIGH (ref 70–99)
Glucose-Capillary: 124 mg/dL — ABNORMAL HIGH (ref 70–99)
Glucose-Capillary: 129 mg/dL — ABNORMAL HIGH (ref 70–99)
Glucose-Capillary: 131 mg/dL — ABNORMAL HIGH (ref 70–99)
Glucose-Capillary: 131 mg/dL — ABNORMAL HIGH (ref 70–99)
Glucose-Capillary: 156 mg/dL — ABNORMAL HIGH (ref 70–99)
Glucose-Capillary: 160 mg/dL — ABNORMAL HIGH (ref 70–99)

## 2023-09-28 LAB — BASIC METABOLIC PANEL
Anion gap: 11 (ref 5–15)
BUN: 15 mg/dL (ref 6–20)
CO2: 18 mmol/L — ABNORMAL LOW (ref 22–32)
Calcium: 8 mg/dL — ABNORMAL LOW (ref 8.9–10.3)
Chloride: 105 mmol/L (ref 98–111)
Creatinine, Ser: 2.71 mg/dL — ABNORMAL HIGH (ref 0.61–1.24)
GFR, Estimated: 28 mL/min — ABNORMAL LOW (ref >60)
Glucose, Bld: 119 mg/dL — ABNORMAL HIGH (ref 70–99)
Potassium: 3.4 mmol/L — ABNORMAL LOW (ref 3.5–5.1)
Sodium: 134 mmol/L — ABNORMAL LOW (ref 135–145)

## 2023-09-28 LAB — MAGNESIUM: Magnesium: 1.9 mg/dL (ref 1.7–2.4)

## 2023-09-28 LAB — PHOSPHORUS: Phosphorus: 3.8 mg/dL (ref 2.5–4.6)

## 2023-09-28 MED ORDER — POTASSIUM CHLORIDE CRYS ER 20 MEQ PO TBCR
20.0000 meq | EXTENDED_RELEASE_TABLET | Freq: Once | ORAL | Status: AC
  Administered 2023-09-28: 20 meq via ORAL
  Filled 2023-09-28: qty 1

## 2023-09-28 NOTE — Plan of Care (Signed)

## 2023-09-28 NOTE — Progress Notes (Signed)
Physical Therapy Treatment Patient Details Name: [name] MRN: [id number] DOB: [birth year] Today's Date: 09/28/2023   History of Present Illness Pt is a 47 y.o. male who presented 09/18/23 from infusion center where he was receiving his Belatacept infusion for chronic immunosuppressive therapy and during his infusion he became hypotensive and had full body burning sensation. Pt received IM epinephrine on ED arrival with improvement in symptoms but noted difficulty speaking and facial droop. Code stroke activated. Imaging revealed scattered small foci of acute subcortical infarction in the left occipital lobe and posterior left frontal lobe and occluded left ICA. PMH includes HTN, DMII, PAD s/p bilateral BKA, renal and pancreatic transplant, depression, GERD.    PT Comments  Patient is agreeable to PT session. Transfer training continued with +2 person assistance required. Facilitation for hip extension on the right. Varying assistance required for static and dynamic sitting balance with mirror used for visual input. Cues required for right side inattention throughout session. Recommend to continue PT to maximize independence and decrease caregiver burden. Anticipate patient can benefit from intensive rehabilitation after this hospital stay.    If plan is discharge home, recommend the following: Assist for transportation;Two people to help with walking and/or transfers;Two people to help with bathing/dressing/bathroom;Assistance with cooking/housework;Direct supervision/assist for medications management;Direct supervision/assist for financial management;Help with stairs or ramp for entrance   Can travel by private vehicle        Equipment Recommendations  Other (comment) (to be determined at next level of care)    Recommendations for Other Services Rehab consult     Precautions / Restrictions Precautions Precautions: Fall;Other (comment) Precaution Comments: Bil BKA (prosthesis in  room), R hemi, avoid hypotension Restrictions Weight Bearing Restrictions: No     Mobility  Bed Mobility Overal bed mobility: Needs Assistance Bed Mobility: Supine to Sit     Supine to sit: Mod assist, +2 for physical assistance, +2 for safety/equipment, HOB elevated, Used rails     General bed mobility comments: cues for sequencing and right side awareness. patient attempting to exit on the left side of bed despite cues for sitting up towards the right side.    Transfers Overall transfer level: Needs assistance Equipment used: Ambulation equipment used Transfers: Sit to/from Stand Sit to Stand: Min assist, Mod assist, +2 physical assistance, +2 safety/equipment           General transfer comment: Min A +2 from bed into steady with RUE supported as the arm is flaccid. Mod A +2 for lower surfaces to bed side commode. several standing bouts performed. faciliation for hip extension on the left    Ambulation/Gait               General Gait Details: not attempted due to poor balance and decreased activity tolerance   Stairs             Wheelchair Mobility     Tilt Bed    Modified Rankin (Stroke Patients Only)       Balance Overall balance assessment: Needs assistance Sitting-balance support: Feet supported, Feet unsupported, Single extremity supported Sitting balance-Leahy Scale: Poor   Postural control: Right lateral lean Standing balance support: Single extremity supported Standing balance-Leahy Scale: Poor (poor with periods of zero) Standing balance comment: right lateral lean with faciliation to midline with standing, period of Mod A of one person, progressing to Max A +2 person with fatigue and increased inattention  Cognition Arousal: Alert Behavior During Therapy: Flat affect, Impulsive Overall Cognitive Status: Difficult to assess Area of Impairment: Following commands, Safety/judgement, Problem  solving, Attention, Awareness                   Current Attention Level: Sustained Memory: Decreased recall of precautions Following Commands: Follows one step commands with increased time, Follows multi-step commands inconsistently Safety/Judgement: Decreased awareness of deficits, Decreased awareness of safety Awareness: Emergent Problem Solving: Slow processing, Difficulty sequencing, Decreased initiation, Requires verbal cues, Requires tactile cues General Comments: right inattention with moderate to maximal cues required. decreased awarenes of need for physical assistance and deficits. mildly impulsive with activity        Exercises      General Comments General comments (skin integrity, edema, etc.): mirror used for visual input for seated balance with increase awareness of midline noted. bilateral prosthesis were donned with maximal assistance from therapist before standing      Pertinent Vitals/Pain Pain Assessment Pain Assessment: No/denies pain    Home Living                          Prior Function            PT Goals (current goals can now be found in the care plan section) Acute Rehab PT Goals Patient Stated Goal: to improve PT Goal Formulation: With patient/family Time For Goal Achievement: 10/04/23 Potential to Achieve Goals: Good Progress towards PT goals: Progressing toward goals    Frequency    Min 1X/week      PT Plan      Co-evaluation PT/OT/SLP Co-Evaluation/Treatment: Yes Reason for Co-Treatment: Complexity of the patient's impairments (multi-system involvement);Necessary to address cognition/behavior during functional activity;For patient/therapist safety;To address functional/ADL transfers PT goals addressed during session: Mobility/safety with mobility;Balance;Strengthening/ROM OT goals addressed during session: ADL's and self-care;Proper use of Adaptive equipment and DME;Strengthening/ROM      AM-PAC PT "6 Clicks"  Mobility   Outcome Measure  Help needed turning from your back to your side while in a flat bed without using bedrails?: A Lot Help needed moving from lying on your back to sitting on the side of a flat bed without using bedrails?: A Lot Help needed moving to and from a bed to a chair (including a wheelchair)?: Total Help needed standing up from a chair using your arms (e.g., wheelchair or bedside chair)?: Total Help needed to walk in hospital room?: Total Help needed climbing 3-5 steps with a railing? : Total 6 Click Score: 8    End of Session Equipment Utilized During Treatment: Gait belt Activity Tolerance: Patient tolerated treatment well Patient left: in chair;with call bell/phone within reach;with chair alarm set Nurse Communication: Mobility status;Need for lift equipment PT Visit Diagnosis: Other symptoms and signs involving the nervous system (R29.898);Unsteadiness on feet (R26.81);Other abnormalities of gait and mobility (R26.89);Muscle weakness (generalized) (M62.81);Difficulty in walking, not elsewhere classified (R26.2);Hemiplegia and hemiparesis Hemiplegia - Right/Left: Right Hemiplegia - dominant/non-dominant: Dominant Hemiplegia - caused by: Cerebral infarction     Time: 1610-9604 PT Time Calculation (min) (ACUTE ONLY): 54 min  Charges:    $Therapeutic Activity: 23-37 mins PT General Charges $$ ACUTE PT VISIT: 1 Visit                     Donna Bernard, PT, MPT    Ina Homes 09/28/2023, 2:06 PM

## 2023-09-28 NOTE — Progress Notes (Signed)
Occupational Therapy Treatment Patient Details Name: [name] MRN: [id number] DOB: [birth year] Today's Date: 09/28/2023   History of present illness Pt is a 47 y.o. male who presented 09/18/23 from infusion center where he was receiving his Belatacept infusion for chronic immunosuppressive therapy and during his infusion he became hypotensive and had full body burning sensation. Pt received IM epinephrine on ED arrival with improvement in symptoms but noted difficulty speaking and facial droop. Code stroke activated. Imaging revealed scattered small foci of acute subcortical infarction in the left occipital lobe and posterior left frontal lobe and occluded left ICA. PMH includes HTN, DMII, PAD s/p bilateral BKA, renal and pancreatic transplant, depression, GERD.   OT comments  Pt remains eager to participate with therapies with some improvements noted in communication. Focused on standing balance in [place] with ADLs at sink, attention to R side and toileting tasks. Pt continues to require +2 physical assist for all mobility due to impaired balance, R inattention and R weakness. Consulted MD for placement of sling to protect weaker R UE with mobility. Continue to recommend intensive rehab at [place].      If plan is discharge home, recommend the following:  Two people to help with walking and/or transfers;Two people to help with bathing/dressing/bathroom;Assistance with cooking/housework;Direct supervision/assist for medications management;Assistance with feeding;Direct supervision/assist for financial management;Assist for transportation;Help with stairs or ramp for entrance;Supervision due to cognitive status   Equipment Recommendations  Other (comment) (TBD pending progress)    Recommendations for Other Services Rehab consult    Precautions / Restrictions Precautions Precautions: Fall;Other (comment) Precaution Comments: Bil BKA (prosthesis in room), R hemi, avoid  hypotension Restrictions Weight Bearing Restrictions: No       Mobility Bed Mobility Overal bed mobility: Needs Assistance Bed Mobility: Supine to Sit     Supine to sit: Mod assist, +2 for physical assistance, +2 for safety/equipment, HOB elevated, Used rails     General bed mobility comments: cued to come to EOB on R side though pt attempting to exit on L side multiple times despite cues- required assist to initiate to R side with use of L UE on footboard- ultimately pulling self very close to foot of bed w/ Max A x 2 needed to laterally scoot to R side for optimal position EOB due to R inattention    Transfers Overall transfer level: Needs assistance Equipment used: Ambulation equipment used Transfers: Sit to/from Stand Sit to Stand: Min assist, Mod assist, +2 physical assistance, +2 safety/equipment           General transfer comment: Min A x 2 from bedside into [place] with RUE support needed due to weakness. Mod A x 2 from lower BSC     Balance Overall balance assessment: Needs assistance Sitting-balance support: Feet supported, Feet unsupported, Single extremity supported Sitting balance-Leahy Scale: Poor   Postural control: Right lateral lean Standing balance support: Single extremity supported Standing balance-Leahy Scale: Poor Standing balance comment: up to Max A x 2 needed for safety during dynamic tasks in [place] due to R lateral lean                           ADL either performed or assessed with clinical judgement   ADL Overall ADL's : Needs assistance/impaired Eating/Feeding: Minimal assistance;Sitting Eating/Feeding Details (indicate cue type and reason): assist to open gatorade bottle, remove food items from paper bag. Grooming: Maximal assistance;Standing;Wash/dry face Grooming Details (indicate cue type and reason): standing at  sink in [place]. when using L UE on Stedy bar, able to stand with no more than Min A but when cued to wring out  washcloth with L hand, wash face and reach for items on R side of counter - up to Max A x 2 to maintain balance as pt unaware of LOB         Upper Body Dressing : Moderate assistance;Sitting   Lower Body Dressing: Maximal assistance;Total assistance;Sit to/from stand;Sitting/lateral leans;+2 for safety/equipment Lower Body Dressing Details (indicate cue type and reason): assist for balance while donning prosthetic LEs. pt able to appropriately indicate sequencing and items needs but frequent R lateral leans   Toilet Transfer Details (indicate cue type and reason): indicated need to use bathroom while in [place] at sink. Placed BSC behind pt with pt able to stand from lower BSC surfaces with Mod A x 2. Toileting- Clothing Manipulation and Hygiene: Total assistance;+2 for safety/equipment;+2 for physical assistance;Sit to/from stand Toileting - Clothing Manipulation Details (indicate cue type and reason): standing in [place]            Extremity/Trunk Assessment Upper Extremity Assessment Upper Extremity Assessment: Right hand dominant;RUE deficits/detail RUE Deficits / Details: overall flaccid, inattention to this UE, questionable sensation of this UE. did seem to lightly grasp St[place] bar but inconsistent RUE Sensation: decreased proprioception RUE Coordination: decreased fine motor;decreased gross motor   Lower Extremity Assessment Lower Extremity Assessment: Defer to PT evaluation        Vision   Vision Assessment?: Vision impaired- to be further tested in functional context Additional Comments: R inattention. with cues, able to look to therapist in mirror on R side, able to gaze to R side to focus on therapist waving goodbye at end of session. significant time needed for pt to scan to R side of counter to locate spray bottle w/ Mod cues   Perception     Praxis      Cognition Arousal: Alert Behavior During Therapy: Flat affect Overall Cognitive Status: Difficult to assess Area  of Impairment: Following commands, Safety/judgement, Problem solving, Attention, Awareness                   Current Attention Level: Sustained Memory: Decreased recall of precautions Following Commands: Follows one step commands with increased time, Follows multi-step commands inconsistently Safety/Judgement: Decreased awareness of deficits, Decreased awareness of safety Awareness: Emergent Problem Solving: Slow processing, Difficulty sequencing, Decreased initiation, Requires verbal cues, Requires tactile cues General Comments: Pt with some impulsiveness, decreased awareness of deficits, and R inattention.  improving verbalizations and able to indicate needs though word finding deficits remain. Cues for safety, R awareness and balance correction throughout        Exercises      Shoulder Instructions       General Comments      Pertinent Vitals/ Pain       Pain Assessment Pain Assessment: No/denies pain  Home Living                                          Prior Functioning/Environment              Frequency  Min 1X/week        Progress Toward Goals  OT Goals(current goals can now be found in the care plan section)  Progress towards OT goals: Progressing toward goals  Acute Rehab OT Goals  Patient Stated Goal: indicated desire to exercise, do whatever needed to maximize independence OT Goal Formulation: With family Time For Goal Achievement: 10/04/23 Potential to Achieve Goals: Good ADL Goals Pt Will Perform Grooming: with min assist;sitting Pt Will Perform Lower Body Bathing: with mod assist;sitting/lateral leans Pt Will Perform Lower Body Dressing: sitting/lateral leans;with mod assist Pt Will Transfer to Toilet: stand pivot transfer;with mod assist;bedside commode Pt Will Perform Toileting - Clothing Manipulation and hygiene: with mod assist;sitting/lateral leans;sit to/from stand Additional ADL Goal #1: Patient will be able to  visually attend to the R to identify object, locate ADL supplies, or to visually scan environment with only min verbal cues. Additional ADL Goal #2: Patient will be able to follow one step commands at 100% accuracy as a precursor to higher level tasks. Additional ADL Goal #3: Patient will be able to complete bed mobility at mod A as a precursor to OOB activities.  Plan      Co-evaluation    PT/OT/SLP Co-Evaluation/Treatment: Yes Reason for Co-Treatment: Complexity of the patient's impairments (multi-system involvement);Necessary to address cognition/behavior during functional activity;For patient/therapist safety;To address functional/ADL transfers   OT goals addressed during session: ADL's and self-care;Proper use of Adaptive equipment and DME;Strengthening/ROM      AM-PAC OT "6 Clicks" Daily Activity     Outcome Measure   Help from another person eating meals?: A Little Help from another person taking care of personal grooming?: A Lot Help from another person toileting, which includes using toliet, bedpan, or urinal?: Total Help from another person bathing (including washing, rinsing, drying)?: A Lot Help from another person to put on and taking off regular upper body clothing?: A Lot Help from another person to put on and taking off regular lower body clothing?: Total 6 Click Score: 11    End of Session Equipment Utilized During Treatment: Gait belt  OT Visit Diagnosis: Unsteadiness on feet (R26.81);Other abnormalities of gait and mobility (R26.89);Muscle weakness (generalized) (M62.81);Low vision, both eyes (H54.2);Other symptoms and signs involving the nervous system (R29.898);Other symptoms and signs involving cognitive function;Cognitive communication deficit (R41.841);Hemiplegia and hemiparesis Symptoms and signs involving cognitive functions: Cerebral infarction Hemiplegia - Right/Left: Right Hemiplegia - dominant/non-dominant: Dominant Hemiplegia - caused by: Cerebral  infarction   Activity Tolerance Patient tolerated treatment well   Patient Left in chair;with call bell/phone within reach;with chair alarm set;with nursing/sitter in room   Nurse Communication Mobility status        Time: 1610-9604 OT Time Calculation (min): 50 min  Charges: OT General Charges $OT Visit: 1 Visit OT Treatments $Self Care/Home Management : 23-37 mins  Bradd Canary, OTR/L Acute Rehab Services Office: [phone]   Lorre Munroe 09/28/2023, 1:10 PM

## 2023-09-28 NOTE — H&P (Incomplete)
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  Patient was at his infusion center today getting his Belatacept infusion for chronic immunosuppressive therapy and during his infusion he became hypotensive and had full body burning sensation.  Patient did receive Benadryl at the infusion center and patient was transported to the [facility] for further evaluation and management.  On initial arrival to the emergency department patient was altered, and had blood pressure of 99/86.  Patient was altered, and minimally responsive.  Patient was given IM epinephrine with mild improvement in mental status.  Patient was mumbling and nonsensical, and therefore with concern for not protecting his own airway, patient was emergently intubated.  Patient subsequently had code stroke called.  Patient was admitted to the ICU for further evaluation management of altered mental status. Treated for E. coli UTI/bacteremia. Continues to have right hemiparesis with significant right arm weakness and mild right leg weakness.  He is speech is improving but remains nonfluent but able to speak sentences and follows commands well.  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 Extubated 9/26. Nausea treated with scheduled Reglan. AKI atop CKD II-IIIa. Nephrology consulted 9/30. Immunosuppressant drug regimen reviewed/adjusted. Good UOP and no significant PVRs. Tolerating diet. Patient requiring mod A bed mobility then mod x 2 to stand to STEDY then min A of 2 for safety.  Participating with multiple STS and standing weight shifting with therapist assist R UE and R knee ext. Decreased awareness of R sided deficits. 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  He is sitting up in bed with lunch tray. Spilled food on gown. He denies nausea or vomiting. On regular diet with assistance. Says prostheses fit well. (At bedside)  Review of Systems  Constitutional:  Negative for chills and fever.  HENT:  Negative for congestion and sore throat.   Eyes:  Negative for blurred vision and double vision.  Respiratory:  Negative for cough and shortness of breath.   Cardiovascular:  Negative for chest pain and palpitations.  Gastrointestinal:  Negative for constipation, nausea and vomiting.  Genitourinary:  Negative for dysuria and urgency.  Neurological:  Negative for dizziness and headaches.  Psychiatric/Behavioral:  Negative for depression.        Poor sleep   Past Medical History:  Diagnosis Date   AMPUTATION, BELOW KNEE, 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   Diabetes mellitus without complication [facility])    no since pancreas transplant   Dialysis patient [facility]) 04/18/2012   "[facility]; [place]"   Gastroparesis    Gastropathy    GERD (gastroesophageal reflux disease)    Hypertension    MRSA infection    over 10 years ago per patient. in legs   Past  Surgical History:  Procedure Laterality Date   AV FISTULA PLACEMENT  08/2011   left upper arm   BELOW KNEE LEG AMPUTATION  "it's been awhile"   bilaterally   CATARACT EXTRACTION  ~ 2011   right   COMBINED KIDNEY-PANCREAS TRANSPLANT  2014   ESOPHAGOGASTRODUODENOSCOPY N/A  12/30/2016   Procedure: ESOPHAGOGASTRODUODENOSCOPY (EGD);  Surgeon: Jeani Hawking, MD;  Location: [facility];  Service: Endoscopy;  Laterality: N/A;   OLECRANON BURSECTOMY Right 06/23/2020   Procedure: RIGHT ELBOW EXCISION OLECRANON BURSITIS;  Surgeon: Nadara Mustard, MD;  Location: [facility];  Service: Orthopedics;  Laterality: Right;   Family History  Problem Relation Age of Onset   Hypertension Mother    Diabetes Mother    Kidney disease Mother    Diabetes Maternal Grandmother    Diabetes Paternal Grandmother    Diabetes Other    Hypertension Other    Lung cancer Maternal Aunt    Colon cancer Neg Hx    Esophageal cancer Neg Hx    Rectal cancer Neg Hx    Stomach cancer Neg Hx    Social History:  reports that he quit smoking about 14 months ago. His smoking use included cigars. He has never used smokeless tobacco. He reports that he does not currently use drugs after having used the following drugs: Marijuana. Frequency: 3.00 times per week. He reports that he does not drink alcohol. Allergies:  Allergies  Allergen Reactions   Amlodipine Rash   Lisinopril Rash   Medications Prior to Admission  Medication Sig Dispense Refill   belatacept (NULOJIX) 250 MG SOLR injection Inject into the vein every 30 (thirty) days.     carvedilol (COREG) 6.25 MG tablet Take 1 tablet (6.25 mg total) by mouth 2 (two) times daily with a meal. 180 tablet 0   ergocalciferol (VITAMIN D2) 1.25 MG (50000 UT) capsule Take 1 capsule (50,000 Units total) by mouth once a week. (Patient taking differently: Take 1 capsule by mouth once a week. Wednesday) 4 capsule 3   fluocinonide cream (LIDEX) 0.05 % Apply one application to affected areas twice daily 120 g 2   hydrALAZINE (APRESOLINE) 50 MG tablet Take 1 tablet (50 mg total) by mouth 4 (four) times daily. (Patient taking differently: Take 50 mg by mouth 2 (two) times daily.) 360 tablet 0   meclizine (ANTIVERT) 25 MG tablet Take 25 mg by mouth 3  (three) times daily as needed for dizziness or nausea.     metoCLOPramide (REGLAN) 10 MG tablet Take 10 mg by mouth 2 (two) times daily.     mycophenolate (MYFORTIC) 180 MG EC tablet Take 360 mg by mouth 2 (two) times daily.     omeprazole (PRILOSEC) 40 MG capsule Take 40 mg by mouth daily.     ondansetron (ZOFRAN) 4 MG tablet Take 4 mg by mouth 2 (two) times daily as needed for nausea or vomiting.     predniSONE (DELTASONE) 5 MG tablet Take 5 mg by mouth daily.     sulfamethoxazole-trimethoprim (BACTRIM,SEPTRA) 400-80 MG per tablet Take 1 tablet by mouth every Monday, Wednesday, and Friday.      terazosin (HYTRIN) 2 MG capsule Take 1 capsule (2 mg total) by mouth at bedtime. 90 capsule 0   rosuvastatin (CRESTOR) 10 MG tablet Take 1 tablet (10 mg total) by mouth daily. (Patient not taking: Reported on 09/18/2023) 90 tablet 1      Home: Home Living Family/patient expects to be discharged to:: Inpatient rehab (or home) Living  Arrangements: Spouse/significant other Available Help at Discharge: Family Type of Home: House Home Access: Stairs to enter Secretary/administrator of Steps: 4 Entrance Stairs-Rails: Right Home Layout: Two level, Able to live on main level with bedroom/bathroom Bathroom Shower/Tub: Engineer, manufacturing systems: Standard Bathroom Accessibility: Yes Home Equipment: Hand held shower head  Lives With: Spouse   Functional History: Prior Function Prior Level of Function : Independent/Modified Independent, Driving, Working/employed Mobility Comments: No AD using bil prostheses to ambulate ADLs Comments: 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  He  could roll to R with mod I but min A to L.  Cues for technique to roll to L then push up with L UE, does require mod A to prevent falling back down when shifting L UE position Transfers Overall transfer level: Needs assistance Equipment used:  (STEDY) Transfers: Sit to/from Stand Sit to Stand: Mod assist, +2 physical assistance Bed to/from chair/wheelchair/BSC transfer type:: Stand pivot Stand pivot transfers: Max assist, +2 physical assistance, +2 safety/equipment General transfer comment: Pt performed STS x 3 into STEDY with heavy dependency on L UE and LE, but light mod A of 2 from PT and tech.  PT assisting on R side to provide support to UE then straighten R knee. Pt in chair earlier for a few hours, reports fatigued, returned to supine. Ambulation/Gait General Gait Details: Deferred for safety at this time.    ADL: ADL Overall ADL's : Needs assistance/impaired Eating/Feeding: NPO Grooming: Moderate assistance, Sitting Upper Body Bathing: Moderate assistance, Sitting Upper Body Bathing Details (indicate cue type and reason): applying lotion to RUE using LUE, able to easily find hand to rub in lotion but required assist to reach to R elbow and bicep area to rub in lotion Lower Body Bathing: Maximal assistance, Total assistance, Sitting/lateral leans, Sit to/from stand Upper Body Dressing : Moderate assistance, Sitting Lower Body Dressing: Maximal assistance, Total assistance, Sit to/from stand, Sitting/lateral leans Toilet Transfer: Maximal assistance, +2 for physical assistance, +2 for safety/equipment, Stand-pivot, BSC/3in1 Toilet Transfer Details (indicate cue type and reason): did not assess to date Toileting- Architect and Hygiene: Total assistance, Sitting/lateral lean, Sit to/from stand Toileting - Clothing Manipulation Details (indicate cue type and reason): unable to complete peri-care in standing Functional mobility during ADLs: Maximal assistance, +2 for  safety/equipment, +2 for physical assistance, Cueing for safety, Cueing for sequencing General ADL Comments: Session focos on increasing overall awareness to R side, toileting, and working on finding midline with use of the mirror at the sink. Patient with improved command following in session, however requires max cues in order to pace himself with movements due to increased impulsivity. Patient also noted to get overwhelmed with lack of communication, benefitting from reassurance and cues to take some deep breaths and attempt again (improved expressive language noted with breaks).  Cognition: Cognition Overall Cognitive Status: Difficult to assess Arousal/Alertness: Awake/alert Orientation Level: Oriented to person, Oriented to place, Oriented to situation, Disoriented to time Attention: Selective Selective Attention: Appears intact Awareness: Impaired Awareness Impairment: Emergent impairment Problem Solving: Impaired Problem Solving Impairment: Verbal basic Cognition Arousal: Alert Behavior During Therapy: Flat affect Overall Cognitive Status: Difficult to assess Area of Impairment: Following commands,  Safety/judgement, Problem solving, Attention Orientation Level: Disoriented to Current Attention Level: Selective Memory: Decreased recall of precautions Following Commands: Follows one step commands with increased time, Follows multi-step commands inconsistently Safety/Judgement: Decreased awareness of deficits, Decreased awareness of safety Awareness: Emergent Problem Solving: Slow processing, Difficulty sequencing, Decreased initiation, Requires verbal cues, Requires tactile cues General Comments: Pt with some impulsiveness, decreased awareness of deficits, and R inattention.  When sitting pt would let go with L hand to try to assist with prostheses and fall hard/quick to R requiring assist to maintain balance.  Pt did say "yes and no" appropriately during session.  He would attempt to  say more but only able to get out 1-2 words then aphasic/word finding difficulty. Did say "I want to try" multiple times Difficult to assess due to: Impaired communication  Physical Exam: Blood pressure 135/64, pulse 70, temperature 98.7 F (37.1 C), temperature source Oral, resp. rate 18, weight 87.6 kg, SpO2 95%. Physical Exam Constitutional:      General: He is not in acute distress. HENT:     Head: Normocephalic and atraumatic.  Eyes:     Extraocular Movements: Extraocular movements intact.     Pupils: Pupils are equal, round, and reactive to light.  Cardiovascular:     Rate and Rhythm: Normal rate and regular rhythm.  Pulmonary:     Effort: Pulmonary effort is normal.     Breath sounds: Normal breath sounds.  Abdominal:     General: Bowel sounds are normal.     Palpations: Abdomen is soft.  Musculoskeletal:     Comments: Bilateral BKA; residual limbs well healed  Neurological:     Mental Status: He is alert and oriented to person, place, and time.  Psychiatric:        Mood and Affect: Mood normal.        Behavior: Behavior normal.     Results for orders placed or performed during the hospital encounter of 09/18/23 (from the past 48 hour(s))  Basic metabolic panel     Status: Abnormal   Collection Time: 09/26/23 11:04 AM  Result Value Ref Range   Sodium 134 (L) 135 - 145 mmol/L   Potassium 3.5 3.5 - 5.1 mmol/L   Chloride 105 98 - 111 mmol/L   CO2 21 (L) 22 - 32 mmol/L   Glucose, Bld 115 (H) 70 - 99 mg/dL    Comment: Glucose reference range applies only to samples taken after fasting for at least 8 hours.   BUN 19 6 - 20 mg/dL   Creatinine, Ser 2.44 (H) 0.61 - 1.24 mg/dL   Calcium 8.0 (L) 8.9 - 10.3 mg/dL   GFR, Estimated 26 (L) >60 mL/min    Comment: (NOTE) Calculated using the CKD-EPI Creatinine Equation (2021)    Anion gap 8 5 - 15    Comment: Performed at [facility], [address], [place], [place] [address]  Glucose, capillary     Status: Abnormal    Collection Time: 09/26/23 12:16 PM  Result Value Ref Range   Glucose-Capillary 170 (H) 70 - 99 mg/dL    Comment: Glucose reference range applies only to samples taken after fasting for at least 8 hours.   Comment 1 Notify RN    Comment 2 Document in Chart   Glucose, capillary     Status: Abnormal   Collection Time: 09/26/23  4:29 PM  Result Value Ref Range   Glucose-Capillary 147 (H) 70 - 99 mg/dL    Comment: Glucose reference  range applies only to samples taken after fasting for at least 8 hours.   Comment 1 Notify RN    Comment 2 Document in Chart   Glucose, capillary     Status: Abnormal   Collection Time: 09/26/23  7:59 PM  Result Value Ref Range   Glucose-Capillary 201 (H) 70 - 99 mg/dL    Comment: Glucose reference range applies only to samples taken after fasting for at least 8 hours.   Comment 1 Notify RN   Glucose, capillary     Status: Abnormal   Collection Time: 09/26/23 11:44 PM  Result Value Ref Range   Glucose-Capillary 115 (H) 70 - 99 mg/dL    Comment: Glucose reference range applies only to samples taken after fasting for at least 8 hours.  Glucose, capillary     Status: Abnormal   Collection Time: 09/27/23  3:31 AM  Result Value Ref Range   Glucose-Capillary 121 (H) 70 - 99 mg/dL    Comment: Glucose reference range applies only to samples taken after fasting for at least 8 hours.  Magnesium     Status: None   Collection Time: 09/27/23  5:08 AM  Result Value Ref Range   Magnesium 1.9 1.7 - 2.4 mg/dL    Comment: Performed at [facility], [address], [place], [place] [address]  Phosphorus     Status: None   Collection Time: 09/27/23  5:08 AM  Result Value Ref Range   Phosphorus 3.0 2.5 - 4.6 mg/dL    Comment: Performed at [facility], [address], [place], [place] [address]  Basic metabolic panel     Status: Abnormal   Collection Time: 09/27/23  5:08 AM  Result Value Ref Range   Sodium 135 135 - 145 mmol/L   Potassium 3.4 (L) 3.5 - 5.1  mmol/L   Chloride 107 98 - 111 mmol/L   CO2 19 (L) 22 - 32 mmol/L   Glucose, Bld 119 (H) 70 - 99 mg/dL    Comment: Glucose reference range applies only to samples taken after fasting for at least 8 hours.   BUN 18 6 - 20 mg/dL   Creatinine, Ser 0.98 (H) 0.61 - 1.24 mg/dL   Calcium 8.0 (L) 8.9 - 10.3 mg/dL   GFR, Estimated 26 (L) >60 mL/min    Comment: (NOTE) Calculated using the CKD-EPI Creatinine Equation (2021)    Anion gap 9 5 - 15    Comment: Performed at [facility], [address] [place], [place] [address]  Glucose, capillary     Status: Abnormal   Collection Time: 09/27/23  9:04 AM  Result Value Ref Range   Glucose-Capillary 205 (H) 70 - 99 mg/dL    Comment: Glucose reference range applies only to samples taken after fasting for at least 8 hours.   Comment 1 Notify RN   Glucose, capillary     Status: Abnormal   Collection Time: 09/27/23 12:14 PM  Result Value Ref Range   Glucose-Capillary 108 (H) 70 - 99 mg/dL    Comment: Glucose reference range applies only to samples taken after fasting for at least 8 hours.  Glucose, capillary     Status: Abnormal   Collection Time: 09/27/23  3:40 PM  Result Value Ref Range   Glucose-Capillary 146 (H) 70 - 99 mg/dL    Comment: Glucose reference range applies only to samples taken after fasting for at least 8 hours.   Comment 1 Notify RN  Glucose, capillary     Status: Abnormal   Collection Time: 09/27/23  7:57 PM  Result Value Ref Range   Glucose-Capillary 218 (H) 70 - 99 mg/dL    Comment: Glucose reference range applies only to samples taken after fasting for at least 8 hours.   Comment 1 Notify RN    Comment 2 Document in Chart   Glucose, capillary     Status: Abnormal   Collection Time: 09/28/23 12:35 AM  Result Value Ref Range   Glucose-Capillary 131 (H) 70 - 99 mg/dL    Comment: Glucose reference range applies only to samples taken after fasting for at least 8 hours.  Glucose, capillary     Status: Abnormal    Collection Time: 09/28/23  4:42 AM  Result Value Ref Range   Glucose-Capillary 129 (H) 70 - 99 mg/dL    Comment: Glucose reference range applies only to samples taken after fasting for at least 8 hours.  Magnesium     Status: None   Collection Time: 09/28/23  8:05 AM  Result Value Ref Range   Magnesium 1.9 1.7 - 2.4 mg/dL    Comment: Performed at [facility], [address], [place], [place] [address]  Phosphorus     Status: None   Collection Time: 09/28/23  8:05 AM  Result Value Ref Range   Phosphorus 3.8 2.5 - 4.6 mg/dL    Comment: Performed at [facility], [address], [place], [place] [address]  Basic metabolic panel     Status: Abnormal   Collection Time: 09/28/23  8:05 AM  Result Value Ref Range   Sodium 134 (L) 135 - 145 mmol/L   Potassium 3.4 (L) 3.5 - 5.1 mmol/L   Chloride 105 98 - 111 mmol/L   CO2 18 (L) 22 - 32 mmol/L   Glucose, Bld 119 (H) 70 - 99 mg/dL    Comment: Glucose reference range applies only to samples taken after fasting for at least 8 hours.   BUN 15 6 - 20 mg/dL   Creatinine, Ser 5.78 (H) 0.61 - 1.24 mg/dL   Calcium 8.0 (L) 8.9 - 10.3 mg/dL   GFR, Estimated 28 (L) >60 mL/min    Comment: (NOTE) Calculated using the CKD-EPI Creatinine Equation (2021)    Anion gap 11 5 - 15    Comment: Performed at [facility], [address] [place], [place] [address]  CBC     Status: Abnormal   Collection Time: 09/28/23  8:05 AM  Result Value Ref Range   WBC 7.1 4.0 - 10.5 K/uL   RBC 4.18 (L) 4.22 - 5.81 MIL/uL   Hemoglobin 12.7 (L) 13.0 - 17.0 g/dL   HCT 95.2 84.1 - 32.4 %   MCV 94.0 80.0 - 100.0 fL   MCH 30.4 26.0 - 34.0 pg   MCHC 32.3 30.0 - 36.0 g/dL   RDW 40.1 (H) 02.7 - 25.3 %   Platelets 273 150 - 400 K/uL   nRBC 0.0 0.0 - 0.2 %    Comment: Performed at [facility], [address], [place], [place] [address]  Glucose, capillary     Status: Abnormal   Collection Time: 09/28/23  8:35 AM  Result Value Ref Range    Glucose-Capillary 106 (H) 70 - 99 mg/dL    Comment: Glucose reference range applies only to samples taken after fasting for at least 8 hours.   Comment 1 Notify RN    Comment 2 Document  in Chart    No results found.    Blood pressure 135/64, pulse 70, temperature 98.7 F (37.1 C), temperature source Oral, resp. rate 18, weight 87.6 kg, SpO2 95%.  Medical Problem List and Plan: 1. Functional deficits secondary to ***  -patient may *** shower  -ELOS/Goals: ***  2.  Antithrombotics: -DVT/anticoagulation:  Pharmaceutical: Lovenox  -antiplatelet therapy: Aspirin and Plavix for three months followed by aspirin alone (started 9/26)  3. Pain Management: Tylenol as needed  4. Mood/Behavior/Sleep: LCSW to evaluate and provide emotional support  -antipsychotic agents: n/a  5. Neuropsych/cognition: This patient *** capable of making decisions on *** own behalf.  6. Skin/Wound Care: Routine skin care checks   7. Fluids/Electrolytes/Nutrition: strict Is and Os and follow-up chemistries  8: Hypertension: monitor TID and prn  -continue hydralazine 50 mg BID  -continue carvedilol 6.25 mg BID  -continue terazosin 2 mg q HS  9: Hyperlipidemia: continue statin  10: Chronic immunosuppressive therapy due to kidney/pancreas transplant 2014  -continue Gengraf, prednisone, Bactrim (Myfortic d/c by nephrology)  11: PAD s/p bilateral BKA  -on aspirin and statin  12: AKI/CKD stage IIIa, s/p transplant: baseline creatinine ~1.6-1.9  -follow-up BMP   13: GERD: continue PPI BID  14: DM s/p pancreas transplant: CBGs QID; A1c = 5.8% (no home meds listed)  -continue SSI  -consider oral agent  -start carb modified diet  15: Nausea: resolved -discontinue Reglan -continue PPI -Zofran prn  15: UTI: E. coli isolated: continue ceftriaxone 2 grams q 24 hours (day #4)  -last dose tonight 10/04  16: Urinary retention: purewick in place  -continue PVRs, in and out cath PRN  -continue terazosin 4  mg daily  17: Hypokalemia: repleted; follow-up BMP      ***  Milinda Antis, PA-C 09/28/2023

## 2023-09-28 NOTE — Progress Notes (Addendum)
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  Patient could not protect airway so was intubated and was admitted ICU.  Subsequently patient was extubated and was transferred to medical service.  Assessment and plan.  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  Neurology was consulted. MRI brain showed scattered small foci of acute subcortical infarction in the left occipital lobe and posterior left frontal lobe.  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  Currently on aspirin and Plavix for 3 weeks then aspirin alone.  Has aphasia.  Will need rehabilitation.  E. coli bacteremia E. coli UTI. On the the background of immunosuppression for renal and pancreatic transplant.  Continue IV Zosyn.  Temperature max of 98.7 F at this time.  Nausea/retching On Reglan pantoprazole chlorpromazine.  Denies vomiting today.   Acute respiratory failure -due to inadequate airway protection in the setting of anaphylactic reaction and stroke.  Patient was initially intubated on mechanical ventilator.  Currently extubated on dysphagia 3 diet.  Protecting airway.  Currently on room air   Anaphylactic reaction to Belatacept infusion.  Patient was hypotensive with altered reaction and required epinephrine.  Needed to be intubated for airway protection.  On prednisone.  Tryptase level was elevated and repeat level improved which indicated anaphylaxis.    Hyponatremia Improved.  Latest sodium of 134.  Check levels in AM.   Hypokalemia Will continue to replenish orally.  Latest potassium of 3.4.  Check BMP in AM.   Hypophosphatemia  Replenished and improved.  Latest phosphorus of 3.8   None anion gap metabolic acidosis. CO2 of 18.  Continue to monitor.   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  Creatinine today at 2.7 from 2.9 yesterday..  Nephrology on board.  Continue prednisone, mycophenolate and cyclosporine. Belatacept has been discontinued due to anaphylactic reaction.. Dr. Kathrene Bongo discussed with transplant medicine at [facility] and they recommended cyclosporine instead.  Ultrasound of the kidneys shows renal transplant without any acute abnormality.  Patient required In-N-Out catheter for urinary retention.  Currently on external urinary catheter.  Spoke with Dr. [name] nephrology at bedside.  Stable from renal point of view for disposition to CIR.  Will continue to BladderScan to make sure not retaining.  PAD status post bilateral BKA Continue Crestor.   Abnormal TSH  Latest TSH of 0.9 and within normal range.   Essential Hypertension Continue Coreg, hydralazine. Was recently increased on the dose of Coreg.  Blood pressure seems to be improved. Off IV fluids.   Deconditioning, debility.  PT has seen the patient and recommend CIR.     DVT prophylaxis: enoxaparin (LOVENOX) injection 40 mg Start: 09/19/23 1015   Code Status:     Code Status: Full Code  Disposition: CIR.  Patient is stable for disposition to CIR when bed is available.  Status is: Inpatient  Remains inpatient appropriate because: need for CIR   Family Communication:  Spoke with the patient's spouse at 09/28/2023 and updated her about the clinical condition of the  patient.  Consultants:  Nephrology PCCM Neurology  Procedures:  Intubation, mechanical ventilation and  extubation,  Antimicrobials:  Rocephin IV  Anti-infectives (From admission, onward)    Start     Dose/Rate Route Frequency Ordered Stop   09/26/23 2200  cefTRIAXone (ROCEPHIN) 2 g in sodium chloride 0.9 % 100 mL IVPB        2 g 200 mL/hr over 30 Minutes Intravenous Every 24 hours 09/26/23 1517     09/24/23 1300  vancomycin (VANCOCIN) IVPB 1000 mg/200 mL premix        1,000 mg 200 mL/hr over 60 Minutes Intravenous Every 24 hours 09/23/23 1224 09/24/23 1309   09/23/23 1300  piperacillin-tazobactam (ZOSYN) IVPB 3.375 g  Status:  Discontinued        3.375 g 12.5 mL/hr over 240 Minutes Intravenous Every 8 hours 09/23/23 1200 09/26/23 1517   09/23/23 1300  vancomycin (VANCOREADY) IVPB 1500 mg/300 mL        1,500 mg 150 mL/hr over 120 Minutes Intravenous  Once 09/23/23 1200 09/23/23 1554   09/20/23 1045  sulfamethoxazole-trimethoprim (BACTRIM) 400-80 MG per tablet 1 tablet        1 tablet Oral Every M-W-F 09/20/23 0952     09/20/23 1000  sulfamethoxazole-trimethoprim (BACTRIM) 400-80 MG per tablet 1 tablet  Status:  Discontinued        1 tablet Per Tube Every M-W-F 09/18/23 1705 09/20/23 0952   09/18/23 1700  sulfamethoxazole-trimethoprim (BACTRIM) 400-80 MG per tablet 1 tablet  Status:  Discontinued        1 tablet Oral Every M-W-F 09/18/23 1659 09/18/23 1705       Subjective: Today, patient was seen and examined at bedside.  Temperature max of 99 F.  Interactive and tearful.  Has aphasia.  Responding to yes or no.  Nursing staff reported needing bladder scan and In-N-Out catheter for urinary retention.  Blood pressure has improved today after discontinuation of IV fluids.  Objective: Vitals:   09/27/23 2000 09/27/23 2353 09/28/23 0440 09/28/23 0831  BP: (!) 158/71 (!) 160/73 (!) 144/72 135/64  Pulse:  73 76 70  Resp:  20 20 18   Temp:  97.8 F (36.6 C) 98.4 F (36.9 C) 98.7 F (37.1 C)  TempSrc:  Oral  Oral  SpO2:  100% 96% 95%  Weight:        Intake/Output Summary (Last 24  hours) at 09/28/2023 1336 Last data filed at 09/28/2023 0501 Gross per 24 hour  Intake 240 ml  Output 1800 ml  Net -1560 ml   Filed Weights   09/23/23 0439 09/24/23 0500 09/26/23 0549  Weight: 83.5 kg 83.2 kg 87.6 kg    Physical Examination: Body mass index is 27.71 kg/m.   General:  Average built, not in obvious distress, more communicative today. HENT:   No scleral pallor or icterus noted. Oral mucosa is moist.  Chest: Diminished breath sounds bilaterally. No crackles or wheezes.  CVS: S1 &S2 heard. No murmur.  Regular rate and rhythm. Abdomen: Soft, nontender, nondistended.  Bowel sounds are heard.  External urinary catheter in place. Extremities: Bilateral BKA Psych: Alert, awake but more Communicative, flat affect,  CNS:  No cranial nerve deficits.   Skin: Warm and dry.    Data Reviewed:   CBC: Recent Labs  Lab 09/22/23 1559 09/23/23 0733 09/24/23 1107 09/25/23 1018 09/28/23 0805  WBC 8.0 13.4* 13.9* 9.7 7.1  HGB 14.6 14.0 12.0* 12.6* 12.7*  HCT 45.3 42.7 37.0* 39.6 39.3  MCV 98.5 96.8  94.4 95.0 94.0  PLT 189 201 201 199 273    Basic Metabolic Panel: Recent Labs  Lab 09/24/23 1107 09/25/23 0708 09/25/23 1018 09/26/23 0525 09/26/23 1104 09/27/23 0508 09/28/23 0805  NA 133*  --  132*  --  134* 135 134*  K 3.5  --  3.4*  --  3.5 3.4* 3.4*  CL 97*  --  102  --  105 107 105  CO2 20*  --  20*  --  21* 19* 18*  GLUCOSE 160*  --  199*  --  115* 119* 119*  BUN 21*  --  21*  --  19 18 15   CREATININE 2.93*  --  3.07*  --  2.88* 2.90* 2.71*  CALCIUM 8.0*  --  8.0*  --  8.0* 8.0* 8.0*  MG 1.9 1.8  --  1.8  --  1.9 1.9  PHOS 2.7 3.4  --  3.0  --  3.0 3.8    Liver Function Tests: Recent Labs  Lab 09/24/23 1107  AST 21  ALT 12  ALKPHOS 62  BILITOT 0.7  PROT 5.5*  ALBUMIN 2.0*     Radiology Studies: No results found.    LOS: 10 days     Joycelyn Das, MD [facility] Available via Epic secure chat 7am-7pm After these hours, please  refer to coverage provider listed on [email or web address] 09/28/2023, 1:36 PM

## 2023-09-28 NOTE — Progress Notes (Signed)
Speech Language Pathology Treatment: Cognitive-Linquistic  Patient Details Name: [name] MRN: [id number] DOB: 05-14-1976 Today's Date: 09/28/2023 Time: 4098-1191 SLP Time Calculation (min) (ACUTE ONLY): 14 min  Assessment / Plan / Recommendation Clinical Impression  Pt presents with improving communication this date, able to state short phases with reduced difficulty. Note paraphasias and groping still present. Provided communication boards this date to facilitate functional communication with pt 100% accurate given functional cueing. This date, pt able to read short phrases without error. Suspect he will perform well with use of aug com device, which will be trialed in subsequent sessions. SLP will continue to follow.   HPI HPI: [name] is a 47 yo male presenting to ED 9/23 with AMS. Pt found to be aphasic with a facial droop and a code stroke was activated. Intubated 9/23-9/24. MRI Brain with scattered small foci of acute subcortical infarction in L occipital lobe and posterior L frontal lobe and occluded L ICA. Recently admitted 9/9-9/11 with findings of E coli UTI and poorly controlled HTN. PMH includes HTN, T2DM, PAD s/p bilateral BKA, s/p renal and pancreatic transplant on chronic immunosuppressive therapy, GERD      SLP Plan  Continue with current plan of care      Recommendations for follow up therapy are one component of a multi-disciplinary discharge planning process, led by the attending physician.  Recommendations may be updated based on patient status, additional functional criteria and insurance authorization.    Recommendations                     Oral care BID   Frequent or constant Supervision/Assistance Aphasia (R47.01);Cognitive communication deficit (Y78.295)     Continue with current plan of care     Gwynneth Aliment, M.A., CF-SLP Speech Language Pathology, Acute Rehabilitation Services  Secure Chat  preferred [phone]   09/28/2023, 2:56 PM

## 2023-09-28 NOTE — Progress Notes (Addendum)
[facility] Progress Note  Name: [name] MRN: [id number] DOB: [birth year]   Subjective:  Patient had 4.3 liters UOP over 10/2 as well as one unmeasured urine void.  He had 189 mL urine on post-void residual bladder scan on 10/2.    Review of systems:    Denies nausea; shakes head "no" to indicate that he is not having trouble eating  Denies shortness of breath  Hx limited by known aphasia    Intake/Output Summary (Last 24 hours) at 09/28/2023 0859 Last data filed at 09/28/2023 0501 Gross per 24 hour  Intake 240 ml  Output 3125 ml  Net -2885 ml    Vitals:  Vitals:   09/27/23 2000 09/27/23 2353 09/28/23 0440 09/28/23 0831  BP: (!) 158/71 (!) 160/73 (!) 144/72 135/64  Pulse:  73 76 70  Resp:  20 20 18   Temp:  97.8 F (36.6 C) 98.4 F (36.9 C) 98.7 F (37.1 C)  TempSrc:  Oral  Oral  SpO2:  100% 96% 95%  Weight:         Physical Exam:    General:  adult male in bed in NAD at rest   HEENT: NCAT  Eyes: EOMI sclera anicteric Neck: supple trachea midline  Heart: S1S2 no rub Lungs: clear and unlabored; room air  Abdomen: soft/nt/nd Extremities: bilateral BKA's no edema of residual limbs  Skin: no rash on extremities exposed Psych no anxiety or agitation  Neuro: awake and interactive; has aphasia; nods or shakes head appropriately.  He is not able to tell me his name   Medications reviewed   Labs:     Latest Ref Rng & Units 09/27/2023    5:08 AM 09/26/2023   11:04 AM 09/25/2023   10:18 AM  BMP  Glucose 70 - 99 mg/dL 034  742  595   BUN 6 - 20 mg/dL 18  19  21    Creatinine 0.61 - 1.24 mg/dL 6.38  7.56  4.33   Sodium 135 - 145 mmol/L 135  134  132   Potassium 3.5 - 5.1 mmol/L 3.4  3.5  3.4   Chloride 98 - 111 mmol/L 107  105  102   CO2 22 - 32 mmol/L 19  21  20    Calcium 8.9 - 10.3 mg/dL 8.0  8.0  8.0      Assessment/Plan:   # AKI  - may be hemodynamic/ischemic ATN in the setting of severe allergic reaction requiring intubation as  well as pre-renal insults from gram negative bacteremia.  Transplant ultrasound without hydro - baseline Cr per the [facility] system appears to be in the high 1's - Continue supportive care - creatinine has plateaued and starting to improve - he has a cyclosporine trough from 9/29 which does appear to be a true trough.  Level is quite low.  Increased cyclosporine to 100 mg BID.  I am reaching out to his transplant center  - Will repeat cyclosporine trough tomorrow - check another post-void residual bladder scan and in/out cath if indicated.  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   - Continue current regimen  - improved off of normal saline   # Allergic reaction - to belatacept  - note his regimen has been adjusted   # Hypokalemia -replete    Please continue inpatient monitoring. Acceptable for transfer to inpatient rehab from a renal standpoint   Estanislado Emms, MD 09/28/2023 9:19 AM   Spoke with transplant at [facility] and we are holding the myfortic given the bacteremia.  They recommended to target a cyclosporine trough of 50-125   Appreciate her assistance.   Estanislado Emms, MD 3:36 PM 09/28/2023

## 2023-09-28 NOTE — Progress Notes (Signed)
Orthopedic Tech Progress Note Patient Details:  [name] 12-10-1976 [id number]  Ortho Devices Type of Ortho Device: Sling immobilizer Ortho Device/Splint Location: RUE Ortho Device/Splint Interventions: Ordered, Other (comment)   Post Interventions Patient Tolerated: Well Instructions Provided: Care of device  [name] 09/28/2023, 7:32 PM

## 2023-09-29 ENCOUNTER — Encounter (HOSPITAL_COMMUNITY): Payer: Self-pay | Admitting: Physical Medicine & Rehabilitation

## 2023-09-29 ENCOUNTER — Inpatient Hospital Stay (HOSPITAL_COMMUNITY)
Admission: AD | Admit: 2023-09-29 | Discharge: 2023-10-19 | DRG: 056 | Disposition: A | Discharge status: Home Health | Payer: 59 | Source: Intra-hospital | Attending: Physical Medicine & Rehabilitation | Admitting: Physical Medicine & Rehabilitation

## 2023-09-29 ENCOUNTER — Other Ambulatory Visit: Payer: Self-pay

## 2023-09-29 DIAGNOSIS — I129 Hypertensive chronic kidney disease with stage 1 through stage 4 chronic kidney disease, or unspecified chronic kidney disease: Secondary | ICD-10-CM | POA: Diagnosis not present

## 2023-09-29 DIAGNOSIS — S06350A Traumatic hemorrhage of left cerebrum without loss of consciousness, initial encounter: Secondary | ICD-10-CM | POA: Diagnosis not present

## 2023-09-29 DIAGNOSIS — E1165 Type 2 diabetes mellitus with hyperglycemia: Secondary | ICD-10-CM | POA: Diagnosis present

## 2023-09-29 DIAGNOSIS — I6522 Occlusion and stenosis of left carotid artery: Secondary | ICD-10-CM | POA: Diagnosis present

## 2023-09-29 DIAGNOSIS — E876 Hypokalemia: Secondary | ICD-10-CM | POA: Diagnosis not present

## 2023-09-29 DIAGNOSIS — N170 Acute kidney failure with tubular necrosis: Secondary | ICD-10-CM | POA: Diagnosis not present

## 2023-09-29 DIAGNOSIS — I63512 Cerebral infarction due to unspecified occlusion or stenosis of left middle cerebral artery: Secondary | ICD-10-CM | POA: Diagnosis not present

## 2023-09-29 DIAGNOSIS — Z888 Allergy status to other drugs, medicaments and biological substances status: Secondary | ICD-10-CM

## 2023-09-29 DIAGNOSIS — S0990XA Unspecified injury of head, initial encounter: Secondary | ICD-10-CM | POA: Diagnosis not present

## 2023-09-29 DIAGNOSIS — T8619 Other complication of kidney transplant: Secondary | ICD-10-CM | POA: Diagnosis present

## 2023-09-29 DIAGNOSIS — G9341 Metabolic encephalopathy: Secondary | ICD-10-CM | POA: Diagnosis not present

## 2023-09-29 DIAGNOSIS — Z9483 Pancreas transplant status: Secondary | ICD-10-CM

## 2023-09-29 DIAGNOSIS — R488 Other symbolic dysfunctions: Secondary | ICD-10-CM | POA: Diagnosis present

## 2023-09-29 DIAGNOSIS — D84821 Immunodeficiency due to drugs: Secondary | ICD-10-CM | POA: Diagnosis not present

## 2023-09-29 DIAGNOSIS — I69320 Aphasia following cerebral infarction: Secondary | ICD-10-CM

## 2023-09-29 DIAGNOSIS — R7881 Bacteremia: Secondary | ICD-10-CM | POA: Diagnosis present

## 2023-09-29 DIAGNOSIS — R339 Retention of urine, unspecified: Secondary | ICD-10-CM | POA: Diagnosis not present

## 2023-09-29 DIAGNOSIS — T782XXA Anaphylactic shock, unspecified, initial encounter: Secondary | ICD-10-CM | POA: Diagnosis not present

## 2023-09-29 DIAGNOSIS — Z801 Family history of malignant neoplasm of trachea, bronchus and lung: Secondary | ICD-10-CM

## 2023-09-29 DIAGNOSIS — B9620 Unspecified Escherichia coli [E. coli] as the cause of diseases classified elsewhere: Secondary | ICD-10-CM | POA: Diagnosis present

## 2023-09-29 DIAGNOSIS — I10 Essential (primary) hypertension: Secondary | ICD-10-CM

## 2023-09-29 DIAGNOSIS — A499 Bacterial infection, unspecified: Secondary | ICD-10-CM | POA: Diagnosis not present

## 2023-09-29 DIAGNOSIS — N390 Urinary tract infection, site not specified: Secondary | ICD-10-CM | POA: Diagnosis present

## 2023-09-29 DIAGNOSIS — D649 Anemia, unspecified: Secondary | ICD-10-CM | POA: Diagnosis not present

## 2023-09-29 DIAGNOSIS — I69322 Dysarthria following cerebral infarction: Secondary | ICD-10-CM | POA: Diagnosis not present

## 2023-09-29 DIAGNOSIS — G3184 Mild cognitive impairment, so stated: Secondary | ICD-10-CM

## 2023-09-29 DIAGNOSIS — I69392 Facial weakness following cerebral infarction: Secondary | ICD-10-CM | POA: Diagnosis not present

## 2023-09-29 DIAGNOSIS — Y830 Surgical operation with transplant of whole organ as the cause of abnormal reaction of the patient, or of later complication, without mention of misadventure at the time of the procedure: Secondary | ICD-10-CM | POA: Diagnosis present

## 2023-09-29 DIAGNOSIS — W19XXXA Unspecified fall, initial encounter: Secondary | ICD-10-CM | POA: Diagnosis not present

## 2023-09-29 DIAGNOSIS — Z89512 Acquired absence of left leg below knee: Secondary | ICD-10-CM | POA: Diagnosis not present

## 2023-09-29 DIAGNOSIS — I69398 Other sequelae of cerebral infarction: Secondary | ICD-10-CM | POA: Diagnosis present

## 2023-09-29 DIAGNOSIS — R569 Unspecified convulsions: Secondary | ICD-10-CM | POA: Diagnosis not present

## 2023-09-29 DIAGNOSIS — R471 Dysarthria and anarthria: Secondary | ICD-10-CM | POA: Diagnosis present

## 2023-09-29 DIAGNOSIS — R54 Age-related physical debility: Secondary | ICD-10-CM | POA: Diagnosis present

## 2023-09-29 DIAGNOSIS — Z87891 Personal history of nicotine dependence: Secondary | ICD-10-CM

## 2023-09-29 DIAGNOSIS — R4182 Altered mental status, unspecified: Secondary | ICD-10-CM | POA: Diagnosis not present

## 2023-09-29 DIAGNOSIS — E1151 Type 2 diabetes mellitus with diabetic peripheral angiopathy without gangrene: Secondary | ICD-10-CM | POA: Diagnosis present

## 2023-09-29 DIAGNOSIS — Z7952 Long term (current) use of systemic steroids: Secondary | ICD-10-CM

## 2023-09-29 DIAGNOSIS — N1831 Chronic kidney disease, stage 3a: Secondary | ICD-10-CM | POA: Diagnosis present

## 2023-09-29 DIAGNOSIS — I639 Cerebral infarction, unspecified: Principal | ICD-10-CM | POA: Diagnosis present

## 2023-09-29 DIAGNOSIS — G8929 Other chronic pain: Secondary | ICD-10-CM | POA: Diagnosis present

## 2023-09-29 DIAGNOSIS — Z8249 Family history of ischemic heart disease and other diseases of the circulatory system: Secondary | ICD-10-CM

## 2023-09-29 DIAGNOSIS — I611 Nontraumatic intracerebral hemorrhage in hemisphere, cortical: Secondary | ICD-10-CM | POA: Diagnosis not present

## 2023-09-29 DIAGNOSIS — I672 Cerebral atherosclerosis: Secondary | ICD-10-CM | POA: Diagnosis not present

## 2023-09-29 DIAGNOSIS — E1122 Type 2 diabetes mellitus with diabetic chronic kidney disease: Secondary | ICD-10-CM | POA: Diagnosis present

## 2023-09-29 DIAGNOSIS — Z8614 Personal history of Methicillin resistant Staphylococcus aureus infection: Secondary | ICD-10-CM

## 2023-09-29 DIAGNOSIS — F32A Depression, unspecified: Secondary | ICD-10-CM | POA: Diagnosis present

## 2023-09-29 DIAGNOSIS — Z7960 Long term (current) use of unspecified immunomodulators and immunosuppressants: Secondary | ICD-10-CM

## 2023-09-29 DIAGNOSIS — I69351 Hemiplegia and hemiparesis following cerebral infarction affecting right dominant side: Principal | ICD-10-CM

## 2023-09-29 DIAGNOSIS — Z7982 Long term (current) use of aspirin: Secondary | ICD-10-CM

## 2023-09-29 DIAGNOSIS — I6782 Cerebral ischemia: Secondary | ICD-10-CM | POA: Diagnosis not present

## 2023-09-29 DIAGNOSIS — N183 Chronic kidney disease, stage 3 unspecified: Secondary | ICD-10-CM | POA: Diagnosis not present

## 2023-09-29 DIAGNOSIS — Z7902 Long term (current) use of antithrombotics/antiplatelets: Secondary | ICD-10-CM

## 2023-09-29 DIAGNOSIS — Z79899 Other long term (current) drug therapy: Secondary | ICD-10-CM

## 2023-09-29 DIAGNOSIS — R482 Apraxia: Secondary | ICD-10-CM | POA: Diagnosis present

## 2023-09-29 DIAGNOSIS — N1832 Chronic kidney disease, stage 3b: Secondary | ICD-10-CM | POA: Diagnosis not present

## 2023-09-29 DIAGNOSIS — E1143 Type 2 diabetes mellitus with diabetic autonomic (poly)neuropathy: Secondary | ICD-10-CM | POA: Diagnosis present

## 2023-09-29 DIAGNOSIS — I69359 Hemiplegia and hemiparesis following cerebral infarction affecting unspecified side: Secondary | ICD-10-CM | POA: Diagnosis not present

## 2023-09-29 DIAGNOSIS — Z89511 Acquired absence of right leg below knee: Secondary | ICD-10-CM

## 2023-09-29 DIAGNOSIS — K3184 Gastroparesis: Secondary | ICD-10-CM | POA: Diagnosis present

## 2023-09-29 DIAGNOSIS — N186 End stage renal disease: Secondary | ICD-10-CM | POA: Diagnosis not present

## 2023-09-29 DIAGNOSIS — R059 Cough, unspecified: Secondary | ICD-10-CM | POA: Diagnosis present

## 2023-09-29 DIAGNOSIS — Z833 Family history of diabetes mellitus: Secondary | ICD-10-CM

## 2023-09-29 DIAGNOSIS — R9089 Other abnormal findings on diagnostic imaging of central nervous system: Secondary | ICD-10-CM | POA: Diagnosis not present

## 2023-09-29 DIAGNOSIS — E785 Hyperlipidemia, unspecified: Secondary | ICD-10-CM | POA: Diagnosis present

## 2023-09-29 DIAGNOSIS — R4701 Aphasia: Secondary | ICD-10-CM | POA: Diagnosis not present

## 2023-09-29 DIAGNOSIS — S88911A Complete traumatic amputation of right lower leg, level unspecified, initial encounter: Secondary | ICD-10-CM | POA: Diagnosis not present

## 2023-09-29 DIAGNOSIS — R29810 Facial weakness: Secondary | ICD-10-CM | POA: Diagnosis present

## 2023-09-29 DIAGNOSIS — Z841 Family history of disorders of kidney and ureter: Secondary | ICD-10-CM

## 2023-09-29 DIAGNOSIS — R233 Spontaneous ecchymoses: Secondary | ICD-10-CM | POA: Diagnosis not present

## 2023-09-29 DIAGNOSIS — K219 Gastro-esophageal reflux disease without esophagitis: Secondary | ICD-10-CM | POA: Diagnosis present

## 2023-09-29 DIAGNOSIS — I959 Hypotension, unspecified: Secondary | ICD-10-CM | POA: Diagnosis not present

## 2023-09-29 DIAGNOSIS — E109 Type 1 diabetes mellitus without complications: Secondary | ICD-10-CM | POA: Diagnosis not present

## 2023-09-29 DIAGNOSIS — G936 Cerebral edema: Secondary | ICD-10-CM | POA: Diagnosis not present

## 2023-09-29 LAB — BASIC METABOLIC PANEL
Anion gap: 8 (ref 5–15)
BUN: 12 mg/dL (ref 6–20)
CO2: 20 mmol/L — ABNORMAL LOW (ref 22–32)
Calcium: 8.2 mg/dL — ABNORMAL LOW (ref 8.9–10.3)
Chloride: 107 mmol/L (ref 98–111)
Creatinine, Ser: 2.18 mg/dL — ABNORMAL HIGH (ref 0.61–1.24)
GFR, Estimated: 36 mL/min — ABNORMAL LOW (ref >60)
Glucose, Bld: 133 mg/dL — ABNORMAL HIGH (ref 70–99)
Potassium: 3.6 mmol/L (ref 3.5–5.1)
Sodium: 135 mmol/L (ref 135–145)

## 2023-09-29 LAB — CBC
HCT: 40.9 % (ref 39.0–52.0)
Hemoglobin: 13 g/dL (ref 13.0–17.0)
MCH: 30.2 pg (ref 26.0–34.0)
MCHC: 31.8 g/dL (ref 30.0–36.0)
MCV: 95.1 fL (ref 80.0–100.0)
Platelets: 310 10*3/uL (ref 150–400)
RBC: 4.3 MIL/uL (ref 4.22–5.81)
RDW: 16.4 % — ABNORMAL HIGH (ref 11.5–15.5)
WBC: 7.3 10*3/uL (ref 4.0–10.5)
nRBC: 0 % (ref 0.0–0.2)

## 2023-09-29 LAB — GLUCOSE, CAPILLARY
Glucose-Capillary: 121 mg/dL — ABNORMAL HIGH (ref 70–99)
Glucose-Capillary: 137 mg/dL — ABNORMAL HIGH (ref 70–99)
Glucose-Capillary: 154 mg/dL — ABNORMAL HIGH (ref 70–99)
Glucose-Capillary: 156 mg/dL — ABNORMAL HIGH (ref 70–99)
Glucose-Capillary: 156 mg/dL — ABNORMAL HIGH (ref 70–99)

## 2023-09-29 LAB — MAGNESIUM: Magnesium: 1.9 mg/dL (ref 1.7–2.4)

## 2023-09-29 LAB — PHOSPHORUS: Phosphorus: 3.7 mg/dL (ref 2.5–4.6)

## 2023-09-29 MED ORDER — SORBITOL 70 % SOLN: 30.0000 mL | Freq: Every day | Status: DC | PRN

## 2023-09-29 MED ORDER — LIDOCAINE HCL URETHRAL/MUCOSAL 2 % EX GEL: 1.0000 | CUTANEOUS | Status: DC | PRN

## 2023-09-29 MED ORDER — CLOPIDOGREL BISULFATE 75 MG PO TABS: 75.0000 mg | ORAL_TABLET | Freq: Every day | ORAL | Status: DC

## 2023-09-29 MED ORDER — ASPIRIN 81 MG PO TBEC
81.0000 mg | DELAYED_RELEASE_TABLET | Freq: Every day | ORAL | Status: DC
  Administered 2023-09-30 – 2023-10-07 (×8): 81 mg via ORAL
  Filled 2023-09-29 (×8): qty 1

## 2023-09-29 MED ORDER — SODIUM CHLORIDE 0.9 % IV SOLN: 2.0000 g | INTRAVENOUS | Status: DC

## 2023-09-29 MED ORDER — SENNOSIDES-DOCUSATE SODIUM 8.6-50 MG PO TABS
1.0000 | ORAL_TABLET | Freq: Every day | ORAL | Status: DC
  Administered 2023-09-29 – 2023-10-12 (×6): 1 via ORAL
  Filled 2023-09-29 (×17): qty 1

## 2023-09-29 MED ORDER — TERAZOSIN HCL 1 MG PO CAPS
4.0000 mg | ORAL_CAPSULE | Freq: Every day | ORAL | Status: DC
  Administered 2023-09-29 – 2023-10-18 (×20): 4 mg via ORAL
  Filled 2023-09-29 (×20): qty 4

## 2023-09-29 MED ORDER — CLOPIDOGREL BISULFATE 75 MG PO TABS
75.0000 mg | ORAL_TABLET | Freq: Every day | ORAL | Status: DC
  Administered 2023-09-30 – 2023-10-07 (×8): 75 mg via ORAL
  Filled 2023-09-29 (×8): qty 1

## 2023-09-29 MED ORDER — METOCLOPRAMIDE HCL 5 MG PO TABS
10.0000 mg | ORAL_TABLET | Freq: Three times a day (TID) | ORAL | Status: DC
  Administered 2023-09-29 – 2023-10-01 (×6): 10 mg via ORAL
  Filled 2023-09-29 (×6): qty 2

## 2023-09-29 MED ORDER — PANTOPRAZOLE SODIUM 40 MG PO TBEC: 40.0000 mg | DELAYED_RELEASE_TABLET | Freq: Two times a day (BID) | ORAL | Status: DC

## 2023-09-29 MED ORDER — MYCOPHENOLATE SODIUM 180 MG PO TBEC: 180.0000 mg | DELAYED_RELEASE_TABLET | Freq: Two times a day (BID) | ORAL | Status: DC

## 2023-09-29 MED ORDER — SODIUM CHLORIDE 0.9 % IV SOLN
2.0000 g | INTRAVENOUS | Status: AC
  Administered 2023-09-29: 2 g via INTRAVENOUS
  Filled 2023-09-29: qty 20

## 2023-09-29 MED ORDER — MELATONIN 3 MG PO TABS: 3.0000 mg | ORAL_TABLET | Freq: Every evening | ORAL | Status: DC | PRN

## 2023-09-29 MED ORDER — POLYETHYLENE GLYCOL 3350 17 G PO PACK: 17.0000 g | PACK | Freq: Every day | ORAL | Status: DC | PRN

## 2023-09-29 MED ORDER — CYCLOSPORINE MODIFIED (GENGRAF) 100 MG PO CAPS
100.0000 mg | ORAL_CAPSULE | Freq: Two times a day (BID) | ORAL | Status: DC
  Administered 2023-09-29 – 2023-10-19 (×40): 100 mg via ORAL
  Filled 2023-09-29: qty 4
  Filled 2023-09-29 (×7): qty 1
  Filled 2023-09-29 (×5): qty 4
  Filled 2023-09-29 (×2): qty 1
  Filled 2023-09-29: qty 4
  Filled 2023-09-29: qty 1
  Filled 2023-09-29 (×4): qty 4
  Filled 2023-09-29: qty 1
  Filled 2023-09-29: qty 4
  Filled 2023-09-29: qty 1
  Filled 2023-09-29: qty 4
  Filled 2023-09-29: qty 1
  Filled 2023-09-29: qty 4
  Filled 2023-09-29: qty 1
  Filled 2023-09-29: qty 4
  Filled 2023-09-29: qty 1
  Filled 2023-09-29 (×5): qty 4
  Filled 2023-09-29: qty 1
  Filled 2023-09-29 (×4): qty 4
  Filled 2023-09-29 (×2): qty 1
  Filled 2023-09-29 (×2): qty 4
  Filled 2023-09-29: qty 1

## 2023-09-29 MED ORDER — ENOXAPARIN SODIUM 40 MG/0.4ML IJ SOSY: 40.0000 mg | PREFILLED_SYRINGE | INTRAMUSCULAR | Status: DC

## 2023-09-29 MED ORDER — PANTOPRAZOLE SODIUM 40 MG PO TBEC
40.0000 mg | DELAYED_RELEASE_TABLET | Freq: Two times a day (BID) | ORAL | Status: DC
  Administered 2023-09-29 – 2023-10-19 (×41): 40 mg via ORAL
  Filled 2023-09-29 (×43): qty 1

## 2023-09-29 MED ORDER — INSULIN ASPART 100 UNIT/ML IJ SOLN: 0.0000 [IU] | INTRAMUSCULAR | Status: DC

## 2023-09-29 MED ORDER — INSULIN ASPART 100 UNIT/ML IJ SOLN
0.0000 [IU] | Freq: Three times a day (TID) | INTRAMUSCULAR | Status: DC
  Administered 2023-09-29 – 2023-09-30 (×5): 3 [IU] via SUBCUTANEOUS
  Administered 2023-10-01 – 2023-10-03 (×6): 2 [IU] via SUBCUTANEOUS

## 2023-09-29 MED ORDER — ONDANSETRON HCL 4 MG PO TABS: 4.0000 mg | ORAL_TABLET | Freq: Four times a day (QID) | ORAL | Status: DC | PRN

## 2023-09-29 MED ORDER — TERAZOSIN HCL 2 MG PO CAPS: 4.0000 mg | ORAL_CAPSULE | Freq: Every day | ORAL | Status: DC

## 2023-09-29 MED ORDER — ROSUVASTATIN CALCIUM 20 MG PO TABS
20.0000 mg | ORAL_TABLET | Freq: Every day | ORAL | Status: DC
Start: 2023-09-29 — End: 2023-10-18

## 2023-09-29 MED ORDER — HYDRALAZINE HCL 50 MG PO TABS
50.0000 mg | ORAL_TABLET | Freq: Two times a day (BID) | ORAL | Status: DC
Start: 2023-09-29 — End: 2023-12-14

## 2023-09-29 MED ORDER — GUAIFENESIN-DM 100-10 MG/5ML PO SYRP: 10.0000 mL | ORAL_SOLUTION | Freq: Four times a day (QID) | ORAL | Status: DC | PRN

## 2023-09-29 MED ORDER — CHLORPROMAZINE HCL 10 MG PO TABS: 10.0000 mg | ORAL_TABLET | Freq: Three times a day (TID) | ORAL | Status: DC | PRN

## 2023-09-29 MED ORDER — HYDRALAZINE HCL 50 MG PO TABS
50.0000 mg | ORAL_TABLET | Freq: Two times a day (BID) | ORAL | Status: DC
  Administered 2023-09-29 – 2023-10-01 (×5): 50 mg via ORAL
  Filled 2023-09-29 (×5): qty 1

## 2023-09-29 MED ORDER — ASPIRIN 81 MG PO TBEC: 81.0000 mg | DELAYED_RELEASE_TABLET | Freq: Every day | ORAL | Status: AC

## 2023-09-29 MED ORDER — TERAZOSIN HCL 1 MG PO CAPS
4.0000 mg | ORAL_CAPSULE | Freq: Every day | ORAL | Status: DC
  Filled 2023-09-29: qty 4

## 2023-09-29 MED ORDER — ROSUVASTATIN CALCIUM 20 MG PO TABS
20.0000 mg | ORAL_TABLET | Freq: Every day | ORAL | Status: DC
  Administered 2023-09-30 – 2023-10-19 (×20): 20 mg via ORAL
  Filled 2023-09-29 (×21): qty 1

## 2023-09-29 MED ORDER — PREDNISONE 10 MG PO TABS
5.0000 mg | ORAL_TABLET | Freq: Every day | ORAL | Status: DC
  Administered 2023-09-30 – 2023-10-19 (×20): 5 mg via ORAL
  Filled 2023-09-29 (×21): qty 1

## 2023-09-29 MED ORDER — ACETAMINOPHEN 325 MG PO TABS
325.0000 mg | ORAL_TABLET | ORAL | Status: DC | PRN
  Administered 2023-10-09: 650 mg via ORAL
  Filled 2023-09-29: qty 2

## 2023-09-29 MED ORDER — ONDANSETRON HCL 4 MG/2ML IJ SOLN: 4.0000 mg | Freq: Four times a day (QID) | INTRAMUSCULAR | Status: DC | PRN

## 2023-09-29 MED ORDER — CHLORHEXIDINE GLUCONATE CLOTH 2 % EX PADS: 6.0000 | MEDICATED_PAD | Freq: Every day | CUTANEOUS | Status: DC

## 2023-09-29 MED ORDER — MELATONIN 5 MG PO TABS
5.0000 mg | ORAL_TABLET | Freq: Every day | ORAL | Status: DC
  Administered 2023-09-29 – 2023-10-16 (×10): 5 mg via ORAL
  Filled 2023-09-29 (×17): qty 1

## 2023-09-29 MED ORDER — DIPHENHYDRAMINE HCL 25 MG PO CAPS: 25.0000 mg | ORAL_CAPSULE | Freq: Four times a day (QID) | ORAL | Status: DC | PRN

## 2023-09-29 MED ORDER — SULFAMETHOXAZOLE-TRIMETHOPRIM 400-80 MG PO TABS
1.0000 | ORAL_TABLET | ORAL | Status: DC
  Administered 2023-10-02 – 2023-10-18 (×8): 1 via ORAL
  Filled 2023-09-29 (×8): qty 1

## 2023-09-29 MED ORDER — CYCLOSPORINE MODIFIED (GENGRAF) 25 MG PO CAPS: 100.0000 mg | ORAL_CAPSULE | Freq: Two times a day (BID) | ORAL | Status: DC

## 2023-09-29 MED ORDER — CARVEDILOL 6.25 MG PO TABS
6.2500 mg | ORAL_TABLET | Freq: Two times a day (BID) | ORAL | Status: DC
  Administered 2023-09-30 – 2023-10-16 (×33): 6.25 mg via ORAL
  Filled 2023-09-29 (×33): qty 1

## 2023-09-29 MED ORDER — METOCLOPRAMIDE HCL 10 MG PO TABS: 10.0000 mg | ORAL_TABLET | Freq: Three times a day (TID) | ORAL | Status: DC

## 2023-09-29 MED ORDER — ENOXAPARIN SODIUM 40 MG/0.4ML IJ SOSY
40.0000 mg | PREFILLED_SYRINGE | Freq: Every day | INTRAMUSCULAR | Status: DC
  Administered 2023-09-30 – 2023-10-07 (×8): 40 mg via SUBCUTANEOUS
  Filled 2023-09-29 (×8): qty 0.4

## 2023-09-29 MED ORDER — METHOCARBAMOL 500 MG PO TABS
500.0000 mg | ORAL_TABLET | Freq: Four times a day (QID) | ORAL | Status: DC | PRN
  Administered 2023-10-09: 500 mg via ORAL
  Filled 2023-09-29: qty 1

## 2023-09-29 NOTE — Progress Notes (Signed)
Speech Language Pathology Treatment: Cognitive-Linquistic  Patient Details Name: [name] MRN: [id number] DOB: 11/03/1976 Today's Date: 09/29/2023 Time: 1449-1510 SLP Time Calculation (min) (ACUTE ONLY): 21 min  Assessment / Plan / Recommendation Clinical Impression  Pt seen for skilled ST treatment targeting use of aug com device. Pt requiring Mod cueing initially to locate target icon, although was successful in subsequent trials with fading cues. SLP facilitated use of full sentences with target word with pt accurately reading written model with phonemic paraphasias noted on 1/5 words. He was then perseverative on verbal error and sound in later opportunities. Attempted use of keyboard to spell his name with pt growing discouraged and stating preference to target pre written icons with images primarily. Given additional practice with use of device, suspect pt could use aug com to supplement verbal output. Will continue to f/u to target aphasia goals.    HPI HPI: [name] is a 47 yo male presenting to ED 9/23 with AMS. Pt found to be aphasic with a facial droop and a code stroke was activated. Intubated 9/23-9/24. MRI Brain with scattered small foci of acute subcortical infarction in L occipital lobe and posterior L frontal lobe and occluded L ICA. Recently admitted 9/9-9/11 with findings of E coli UTI and poorly controlled HTN. PMH includes HTN, T2DM, PAD s/p bilateral BKA, s/p renal and pancreatic transplant on chronic immunosuppressive therapy, GERD      SLP Plan  Continue with current plan of care      Recommendations for follow up therapy are one component of a multi-disciplinary discharge planning process, led by the attending physician.  Recommendations may be updated based on patient status, additional functional criteria and insurance authorization.    Recommendations                     Oral care BID   Frequent or constant  Supervision/Assistance Aphasia (R47.01);Cognitive communication deficit (W09.811)     Continue with current plan of care     Gwynneth Aliment, M.A., CF-SLP Speech Language Pathology, Acute Rehabilitation Services  Secure Chat preferred [phone]   09/29/2023, 3:13 PM

## 2023-09-29 NOTE — Discharge Summary (Signed)
Physician Discharge Summary  [name] AOZ:[id number] DOB: 13-Oct-1976 DOA: 09/18/2023  PCP: [name], MD  Admit date: 09/18/2023 Discharge date: 09/30/2023  Admitted From: Home  Discharge disposition: CIR  Recommendations for Outpatient Follow-Up:   Follow up with your primary care provider and nephrology as outpatient. Recommend follow-up with nephrology during rehab stay. Neurology recommends aspirin and Plavix for 21 days followed by aspirin alone.  Discharge Diagnosis:   Principal Problem:   Acute metabolic encephalopathy Active Problems:   Ischemic cerebrovascular accident (CVA) due to global hypoperfusion with watershed infarction [name])   Anaphylactic shock   Altered mental status   Discharge Condition: Improved.  Diet recommendation:  Regular.  Wound care: None.  Code status: Full.   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  Patient could not protect airway so was intubated and was admitted ICU.  Subsequently, patient was extubated and was transferred to medical service.    Hospital Course:   Following conditions were addressed during hospitalization as listed below,  Assessment and plan.   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  Neurology was consulted. MRI brain showed scattered small foci of acute subcortical infarction in the left occipital lobe and posterior left frontal lobe.  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  Currently plan is to continue aspirin and Plavix for 3 weeks then  aspirin alone.  Has aphasia.  Seen by physical therapy occupational therapy.  Plan for CIR at this time.   E. coli bacteremia E. coli UTI. On the the background of immunosuppression for renal and pancreatic transplant.  Patient is receiving Rocephin and last dose will be 09/29/2023. temperature max of 98.7 F at this time.  Repeat blood culture sent  09/29/23.  End date of Rocephin 09/29/2023.   Nausea/retching On Reglan, pantoprazole chlorpromazine.  No mention of vomiting.   Acute respiratory failure -due to inadequate airway protection in the setting of anaphylactic reaction and stroke.  Patient was initially intubated on mechanical ventilator.  Currently extubated on room air, on regular diet.   Anaphylactic reaction to Belatacept infusion.  Patient was hypotensive with altered reaction and required epinephrine.  Needed to be intubated for airway protection.  Tryptase level was elevated and repeat level improved which indicated anaphylaxis.    Hyponatremia Improved.  Latest sodium of 135.  Check levels in AM.   Hypokalemia Latest potassium level at 3.6.   Hypophosphatemia  Replenished and improved.  Latest phosphorus of 3.7   None anion gap metabolic acidosis. CO2 of 20 from 18.  Continue to monitor.   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  Creatinine today at 2.1 and trending down.  Nephrology on board.  Continue prednisone, mycophenolate and cyclosporine. Belatacept has been discontinued due to anaphylactic reaction.. Dr. Kathrene Bongo, nephrology had discussed with transplant medicine at [facility] and they recommended cyclosporine instead.  Ultrasound of the kidneys shows renal transplant without any acute abnormality.  Patient required In-N-Out catheter for urinary retention.  Currently on external urinary catheter.  Communicated with Dr. [name] nephrology, okay from renal point of view for disposition at this  time.  Dose  of terazosin has been increased to 4 mg from 2 mg.  Would recommend nephrology follow-up during rehabilitation.   PAD status post bilateral BKA Continue Crestor.   Abnormal TSH  Latest TSH of 0.9 and within normal range.   Essential Hypertension Continue Coreg, hydralazine. Was recently increased on the dose of Coreg.  Blood pressure seems to be still elevated.  Dose of terazosin has been increased to 4 mg today.  Off IV fluids.   Deconditioning, debility.  PT has seen the patient and recommend CIR.  Disposition.  At this time, patient is stable for disposition to CIR.  Medical Consultants:   Nephrology PCCM Neurology  Procedures:    Intubation, mechanical ventilation and extubation,   Subjective:   Today, patient was seen and examined at bedside.  Working with physical therapy occupational therapy.  Denies any pain, nausea, vomiting.  Communicating better.  Discharge Exam:   Vitals:   09/29/23 1232 09/29/23 1623  BP: (!) 149/70 (!) 146/67  Pulse: 72 76  Resp: 17 18  Temp: 98.5 F (36.9 C) 98.6 F (37 C)  SpO2: 96% 97%   Vitals:   09/29/23 0500 09/29/23 0812 09/29/23 1232 09/29/23 1623  BP:  (!) 163/72 (!) 149/70 (!) 146/67  Pulse:  69 72 76  Resp:  18 17 18   Temp:  98.7 F (37.1 C) 98.5 F (36.9 C) 98.6 F (37 C)  TempSrc:  Axillary Oral Oral  SpO2:  97% 96% 97%  Weight: 81.7 kg       General: Alert awake, not in obvious distress, has expressive aphasia, HENT: pupils equally reacting to light,  No scleral pallor or icterus noted. Oral mucosa is moist.  Chest:  Clear breath sounds.  No crackles or wheezes.  CVS: S1 &S2 heard. No murmur.  Regular rate and rhythm. Abdomen: Soft, nontender, nondistended.  Bowel sounds are heard.   Extremities: Bilateral below-knee amputation.  Psych: Alert, awake Communicative, expressive aphasia, flat affect, CNS: Expressive aphasia, flat affect, moving extremities, Skin: Warm and dry.  No rashes noted.  The results of  significant diagnostics from this hospitalization (including imaging, microbiology, ancillary and laboratory) are listed below for reference.     Diagnostic Studies:   CT HEAD WO CONTRAST ( )  Result Date: 09/19/2023 CLINICAL DATA:  Initial evaluation for neuro deficit, stroke suspected, change in mental status. EXAM: CT HEAD WITHOUT CONTRAST TECHNIQUE: Contiguous axial images were obtained from the base of the skull through the vertex without intravenous contrast. RADIATION DOSE REDUCTION: This exam was performed according to the departmental dose-optimization program which includes automated exposure control, adjustment of the mA and/or kV according to patient size and/or use of iterative reconstruction technique. COMPARISON:  Prior CT and MRI from earlier the same day. FINDINGS: Brain: Cerebral volume within normal limits. Previously identified ischemic infarcts involving the left cerebral hemisphere not well visualized by CT. No other acute large vessel territory infarct. No acute intracranial hemorrhage. No mass lesion or midline shift. No hydrocephalus or extra-axial fluid collection. Vascular: No abnormal asymmetric hyperdense vessel. Skull: Scalp soft tissues and calvarium demonstrate no new finding. Sinuses/Orbits: Globes and orbital soft tissues within normal limits. Mild scattered mucosal thickening about the ethmoidal air cells and maxillary sinuses. No significant mastoid effusion. Other: None. IMPRESSION: 1. No acute intracranial abnormality. 2. Previously identified small volume left cerebral infarcts not well visualized by CT. Electronically Signed   By: Rise Mu M.D.   On: 09/19/2023 06:19   DG Abd 1  View  Result Date: 09/19/2023 CLINICAL DATA:  [id number] Encounter for imaging study to confirm orogastric (OG) tube placement [id number] EXAM: ABDOMEN - 1 VIEW COMPARISON:  CT abdomen pelvis 10/25/2017 FINDINGS: Enteric tube courses below the hemidiaphragm with tip and side port  overlying the expected region of the gastric lumen. The bowel gas pattern is normal. No radio-opaque calculi or other significant radiographic abnormality are seen. IMPRESSION: Enteric tube in good position. Electronically Signed   By: Tish Frederickson M.D.   On: 09/19/2023 03:32   MR BRAIN WO CONTRAST  Result Date: 09/18/2023 CLINICAL DATA:  Neuro deficit, acute, stroke suspected. Unresponsive. EXAM: MRI HEAD WITHOUT CONTRAST MRA HEAD WITHOUT CONTRAST TECHNIQUE: Multiplanar, multi-echo pulse sequences of the brain and surrounding structures were acquired without intravenous contrast. Angiographic images of the Circle of Willis were acquired using MRA technique without intravenous contrast. COMPARISON:  Head CT 09/18/2023. FINDINGS: MRI HEAD FINDINGS Brain: Scattered small foci of acute subcortical infarction in the left occipital lobe and posterior left frontal lobe. Faint diffusion signal abnormality along the left parietal lobe (axial image 91 series 5) may reflect evolving infarct versus seizure related cytotoxic edema. No acute hemorrhage or significant mass effect. Background of mild chronic small-vessel disease. Vascular: See below. Skull and upper cervical spine: Normal marrow signal. Sinuses/Orbits: No acute findings. Other: None. 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 The proximal ACAs and MCAs are patent without stenosis or aneurysm. Distal branches are symmetric. Posterior circulation: Visualized portions of the distal vertebral arteries and basilar artery are patent without stenosis or aneurysm. The SCAs, AICAs and PICAs are patent proximally. The PCAs are patent proximally without stenosis or aneurysm. Distal branches are symmetric. Anatomic variants: None. IMPRESSION: 1. Scattered small foci of acute subcortical infarction in the left occipital lobe and posterior left frontal lobe. 2. Faint diffusion signal abnormality along the  left parietal lobe may reflect evolving infarct versus seizure related cytotoxic edema. 3. 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 Code stroke imaging results were communicated on 09/18/2023 at 7:24 pm to provider Dr. Derry Lory via secure text paging. Electronically Signed   By: Orvan Falconer M.D.   On: 09/18/2023 19:27   MR ANGIO HEAD WO CONTRAST  Result Date: 09/18/2023 CLINICAL DATA:  Neuro deficit, acute, stroke suspected. Unresponsive. EXAM: MRI HEAD WITHOUT CONTRAST MRA HEAD WITHOUT CONTRAST TECHNIQUE: Multiplanar, multi-echo pulse sequences of the brain and surrounding structures were acquired without intravenous contrast. Angiographic images of the Circle of Willis were acquired using MRA technique without intravenous contrast. COMPARISON:  Head CT 09/18/2023. FINDINGS: MRI HEAD FINDINGS Brain: Scattered small foci of acute subcortical infarction in the left occipital lobe and posterior left frontal lobe. Faint diffusion signal abnormality along the left parietal lobe (axial image 91 series 5) may reflect evolving infarct versus seizure related cytotoxic edema. No acute hemorrhage or significant mass effect. Background of mild chronic small-vessel disease. Vascular: See below. Skull and upper cervical spine: Normal marrow signal. Sinuses/Orbits: No acute findings. Other: None. 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 The proximal ACAs and MCAs are patent without stenosis or aneurysm. Distal branches are symmetric. Posterior circulation: Visualized portions of the distal vertebral arteries and basilar artery are patent without stenosis or aneurysm. The SCAs, AICAs and PICAs are patent proximally. The PCAs are patent proximally without stenosis or aneurysm. Distal branches are symmetric. Anatomic variants: None. IMPRESSION: 1. Scattered small foci of acute subcortical infarction  in the left occipital lobe and  posterior left frontal lobe. 2. Faint diffusion signal abnormality along the left parietal lobe may reflect evolving infarct versus seizure related cytotoxic edema. 3. 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 Code stroke imaging results were communicated on 09/18/2023 at 7:24 pm to provider Dr. Derry Lory via secure text paging. Electronically Signed   By: Orvan Falconer M.D.   On: 09/18/2023 19:27   CT HEAD CODE STROKE WO CONTRAST  Result Date: 09/18/2023 CLINICAL DATA:  Code stroke. Neuro deficit, acute, stroke suspected. Unresponsive. EXAM: CT HEAD WITHOUT CONTRAST TECHNIQUE: Contiguous axial images were obtained from the base of the skull through the vertex without intravenous contrast. RADIATION DOSE REDUCTION: This exam was performed according to the departmental dose-optimization program which includes automated exposure control, adjustment of the mA and/or kV according to patient size and/or use of iterative reconstruction technique. COMPARISON:  Head CT 09/04/2023. FINDINGS: Brain: No acute intracranial hemorrhage. Stable mild chronic small-vessel disease. Cortical gray-white differentiation is preserved. No hydrocephalus or extra-axial collection. No mass effect or midline shift. Vascular: No hyperdense vessel or unexpected calcification. Skull: No calvarial fracture or suspicious bone lesion. Skull base is unremarkable. Sinuses/Orbits: No acute finding. Other: None. ASPECTS [facility] Stroke Program Early CT Score) - Ganglionic level infarction (caudate, lentiform nuclei, internal capsule, insula, M1-M3 cortex): 7 - Supraganglionic infarction (M4-M6 cortex): 3 Total score (0-10 with 10 being normal): 10 IMPRESSION: 1. No acute intracranial hemorrhage or evidence of acute large vessel territory infarct. ASPECT score is 10. 2. Stable mild chronic small-vessel disease. Code stroke imaging results were communicated on 09/18/2023 at 4:26 pm to provider Dr. Selina Cooley via secure text  paging. Electronically Signed   By: Orvan Falconer M.D.   On: 09/18/2023 16:27     Labs:   Basic Metabolic Panel: Recent Labs  Lab 09/25/23 0708 09/25/23 1018 09/26/23 0525 09/26/23 1104 09/27/23 0508 09/28/23 0805 09/29/23 0644  NA  --  132*  --  134* 135 134* 135  K  --  3.4*  --  3.5 3.4* 3.4* 3.6  CL  --  102  --  105 107 105 107  CO2  --  20*  --  21* 19* 18* 20*  GLUCOSE  --  199*  --  115* 119* 119* 133*  BUN  --  21*  --  19 18 15 12   CREATININE  --  3.07*  --  2.88* 2.90* 2.71* 2.18*  CALCIUM  --  8.0*  --  8.0* 8.0* 8.0* 8.2*  MG 1.8  --  1.8  --  1.9 1.9 1.9  PHOS 3.4  --  3.0  --  3.0 3.8 3.7   GFR Estimated Creatinine Clearance: 42.8 mL/min (A) (by C-G formula based on SCr of 2.18 mg/dL (H)). Liver Function Tests: Recent Labs  Lab 09/24/23 1107  AST 21  ALT 12  ALKPHOS 62  BILITOT 0.7  PROT 5.5*  ALBUMIN 2.0*   No results for input(s): "LIPASE", "AMYLASE" in the last 168 hours. No results for input(s): "AMMONIA" in the last 168 hours. Coagulation profile No results for input(s): "INR", "PROTIME" in the last 168 hours.  CBC: Recent Labs  Lab 09/24/23 1107 09/25/23 1018 09/28/23 0805 09/29/23 0644  WBC 13.9* 9.7 7.1 7.3  HGB 12.0* 12.6* 12.7* 13.0  HCT 37.0* 39.6 39.3 40.9  MCV 94.4 95.0 94.0 95.1  PLT 201 199 273 310   Cardiac Enzymes: No results for input(s): "CKTOTAL", "CKMB", "CKMBINDEX", "TROPONINI" in the  last 168 hours. BNP: Invalid input(s): "POCBNP" CBG: Recent Labs  Lab 09/29/23 0819 09/29/23 1241 09/29/23 1626 09/29/23 2130 09/30/23 0613  GLUCAP 137* 154* 156* 156* 152*   D-Dimer No results for input(s): "DDIMER" in the last 72 hours. Hgb A1c No results for input(s): "HGBA1C" in the last 72 hours. Lipid Profile No results for input(s): "CHOL", "HDL", "LDLCALC", "TRIG", "CHOLHDL", "LDLDIRECT" in the last 72 hours. Thyroid function studies No results for input(s): "TSH", "T4TOTAL", "T3FREE", "THYROIDAB" in the last 72  hours.  Invalid input(s): "FREET3" Anemia work up No results for input(s): "VITAMINB12", "FOLATE", "FERRITIN", "TIBC", "IRON", "RETICCTPCT" in the last 72 hours. Microbiology Recent Results (from the past 240 hour(s))  Culture, blood (Routine X 2) w Reflex to ID Panel     Status: Abnormal   Collection Time: 09/23/23 10:51 AM   Specimen: BLOOD LEFT ARM  Result Value Ref Range Status   Specimen Description BLOOD LEFT ARM  Final   Special Requests   Final    BOTTLES DRAWN AEROBIC AND ANAEROBIC Blood Culture adequate volume   Culture  Setup Time   Final    GRAM NEGATIVE RODS IN BOTH AEROBIC AND ANAEROBIC BOTTLES CRITICAL RESULT CALLED TO, READ BACK BY AND VERIFIED WITH: J [facility]  09/24/23 MK Performed at [facility], [address], [place], [place] [address]    Culture ESCHERICHIA COLI (A)  Final   Report Status 09/26/2023 FINAL  Final   Organism ID, Bacteria ESCHERICHIA COLI  Final      Susceptibility   Escherichia coli - MIC*    AMPICILLIN >=32 RESISTANT Resistant     CEFEPIME <=0.12 SENSITIVE Sensitive     CEFTAZIDIME <=1 SENSITIVE Sensitive     CEFTRIAXONE <=0.25 SENSITIVE Sensitive     CIPROFLOXACIN <=0.25 SENSITIVE Sensitive     GENTAMICIN <=1 SENSITIVE Sensitive     IMIPENEM <=0.25 SENSITIVE Sensitive     TRIMETH/SULFA >=320 RESISTANT Resistant     AMPICILLIN/SULBACTAM 16 INTERMEDIATE Intermediate     PIP/TAZO <=4 SENSITIVE Sensitive     * ESCHERICHIA COLI  Blood Culture ID Panel (Reflexed)     Status: Abnormal   Collection Time: 09/23/23 10:51 AM  Result Value Ref Range Status   Enterococcus faecalis NOT DETECTED NOT DETECTED Final   Enterococcus Faecium NOT DETECTED NOT DETECTED Final   Listeria monocytogenes NOT DETECTED NOT DETECTED Final   Staphylococcus species NOT DETECTED NOT DETECTED Final   Staphylococcus aureus (BCID) NOT DETECTED NOT DETECTED Final   Staphylococcus epidermidis NOT DETECTED NOT DETECTED Final   Staphylococcus lugdunensis  NOT DETECTED NOT DETECTED Final   Streptococcus species NOT DETECTED NOT DETECTED Final   Streptococcus agalactiae NOT DETECTED NOT DETECTED Final   Streptococcus pneumoniae NOT DETECTED NOT DETECTED Final   Streptococcus pyogenes NOT DETECTED NOT DETECTED Final   A.calcoaceticus-baumannii NOT DETECTED NOT DETECTED Final   Bacteroides fragilis NOT DETECTED NOT DETECTED Final   Enterobacterales DETECTED (A) NOT DETECTED Final    Comment: Enterobacterales represent a large order of gram negative bacteria, not a single organism. CRITICAL RESULT CALLED TO, READ BACK BY AND VERIFIED WITH: J WYLAND,PHARMD@[id number]  09/24/23 MK    Enterobacter cloacae complex NOT DETECTED NOT DETECTED Final   Escherichia coli DETECTED (A) NOT DETECTED Final    Comment: CRITICAL RESULT CALLED TO, READ BACK BY AND VERIFIED WITH: J WYLAND,PHARMD@[id number]  09/24/23 MK    Klebsiella aerogenes NOT DETECTED NOT DETECTED Final   Klebsiella oxytoca NOT DETECTED NOT DETECTED Final   Klebsiella pneumoniae NOT DETECTED NOT  DETECTED Final   Proteus species NOT DETECTED NOT DETECTED Final   Salmonella species NOT DETECTED NOT DETECTED Final   Serratia marcescens NOT DETECTED NOT DETECTED Final   Haemophilus influenzae NOT DETECTED NOT DETECTED Final   Neisseria meningitidis NOT DETECTED NOT DETECTED Final   Pseudomonas aeruginosa NOT DETECTED NOT DETECTED Final   Stenotrophomonas maltophilia NOT DETECTED NOT DETECTED Final   Candida albicans NOT DETECTED NOT DETECTED Final   Candida auris NOT DETECTED NOT DETECTED Final   Candida glabrata NOT DETECTED NOT DETECTED Final   Candida krusei NOT DETECTED NOT DETECTED Final   Candida parapsilosis NOT DETECTED NOT DETECTED Final   Candida tropicalis NOT DETECTED NOT DETECTED Final   Cryptococcus neoformans/gattii NOT DETECTED NOT DETECTED Final   CTX-M ESBL NOT DETECTED NOT DETECTED Final   Carbapenem resistance IMP NOT DETECTED NOT DETECTED Final   Carbapenem resistance KPC NOT  DETECTED NOT DETECTED Final   Carbapenem resistance NDM NOT DETECTED NOT DETECTED Final   Carbapenem resist OXA 48 LIKE NOT DETECTED NOT DETECTED Final   Carbapenem resistance VIM NOT DETECTED NOT DETECTED Final    Comment: Performed at [facility], [address], [place], [place] [address]  Culture, blood (Routine X 2) w Reflex to ID Panel     Status: Abnormal   Collection Time: 09/23/23 10:55 AM   Specimen: BLOOD LEFT ARM  Result Value Ref Range Status   Specimen Description BLOOD LEFT ARM  Final   Special Requests   Final    BOTTLES DRAWN AEROBIC AND ANAEROBIC Blood Culture adequate volume   Culture  Setup Time   Final    GRAM NEGATIVE RODS ANAEROBIC BOTTLE ONLY CRITICAL VALUE NOTED.  VALUE IS CONSISTENT WITH PREVIOUSLY REPORTED AND CALLED VALUE.    Culture (A)  Final    ESCHERICHIA COLI SUSCEPTIBILITIES PERFORMED ON PREVIOUS CULTURE WITHIN THE LAST 5 DAYS. Performed at [facility], [address], [place], [place] [address]    Report Status 09/26/2023 FINAL  Final  Urine Culture     Status: Abnormal   Collection Time: 09/23/23 12:39 PM   Specimen: Urine, Clean Catch  Result Value Ref Range Status   Specimen Description URINE, CLEAN CATCH  Final   Special Requests   Final    NONE Performed at [facility], [address], [place], [place] [address]    Culture >=100,000 COLONIES/mL ESCHERICHIA COLI (A)  Final   Report Status 09/25/2023 FINAL  Final   Organism ID, Bacteria ESCHERICHIA COLI (A)  Final      Susceptibility   Escherichia coli - MIC*    AMPICILLIN >=32 RESISTANT Resistant     CEFAZOLIN <=4 SENSITIVE Sensitive     CEFEPIME <=0.12 SENSITIVE Sensitive     CEFTRIAXONE <=0.25 SENSITIVE Sensitive     CIPROFLOXACIN <=0.25 SENSITIVE Sensitive     GENTAMICIN <=1 SENSITIVE Sensitive     IMIPENEM <=0.25 SENSITIVE Sensitive     NITROFURANTOIN <=16 SENSITIVE Sensitive     TRIMETH/SULFA >=320 RESISTANT Resistant     AMPICILLIN/SULBACTAM 16  INTERMEDIATE Intermediate     PIP/TAZO <=4 SENSITIVE Sensitive     * >=100,000 COLONIES/mL ESCHERICHIA COLI  Culture, blood (Routine X 2) w Reflex to ID Panel     Status: None (Preliminary result)   Collection Time: 09/29/23  8:54 AM   Specimen: BLOOD  Result Value Ref Range Status   Specimen Description BLOOD BLOOD RIGHT HAND  Final   Special Requests AEROBIC BOTTLE ONLY Blood  Culture adequate volume  Final   Culture   Final    NO GROWTH < 24 HOURS Performed at [facility], [address] [place], [place] [address]    Report Status PENDING  Incomplete  Culture, blood (Routine X 2) w Reflex to ID Panel     Status: None (Preliminary result)   Collection Time: 09/29/23  8:55 AM   Specimen: BLOOD  Result Value Ref Range Status   Specimen Description BLOOD BLOOD LEFT HAND  Final   Special Requests AEROBIC BOTTLE ONLY Blood Culture adequate volume  Final   Culture   Final    NO GROWTH < 24 HOURS Performed at [facility], [address] [place], [place] [address]    Report Status PENDING  Incomplete     Discharge Instructions:   Discharge Instructions     Call MD for:  persistant nausea and vomiting   Complete by: As directed    Call MD for:  severe uncontrolled pain   Complete by: As directed    Call MD for:  temperature >100.4   Complete by: As directed    Diet general   Complete by: As directed    Discharge instructions   Complete by: As directed    Follow up with your primary care provider and nephrology after discharge.   Increase activity slowly   Complete by: As directed       Allergies as of 09/29/2023       Reactions   Amlodipine Rash   Lisinopril Rash        Medication List     STOP taking these medications    belatacept 250 MG Solr injection Commonly known as: NULOJIX   omeprazole 40 MG capsule Commonly known as: PRILOSEC   ondansetron 4 MG tablet Commonly known as: ZOFRAN       TAKE these medications    aspirin EC 81 MG  tablet Take 1 tablet (81 mg total) by mouth daily. Swallow whole.   carvedilol 6.25 MG tablet Commonly known as: COREG Take 1 tablet (6.25 mg total) by mouth 2 (two) times daily with a meal.   cefTRIAXone 2 g in sodium chloride 0.9 % 100 mL Inject 2 g into the vein daily for 1 day.   chlorproMAZINE 10 MG tablet Commonly known as: THORAZINE Take 1 tablet (10 mg total) by mouth 3 (three) times daily as needed for hiccoughs.   clopidogrel 75 MG tablet Commonly known as: PLAVIX Take 1 tablet (75 mg total) by mouth daily for 13 days.   cycloSPORINE modified 25 MG capsule Commonly known as: GENGRAF Take 4 capsules (100 mg total) by mouth 2 (two) times daily.   ergocalciferol 1.25 MG (50000 UT) capsule Commonly known as: VITAMIN D2 Take 1 capsule (50,000 Units total) by mouth once a week. What changed: additional instructions   fluocinonide cream 0.05 % Commonly known as: LIDEX Apply one application to affected areas twice daily   hydrALAZINE 50 MG tablet Commonly known as: APRESOLINE Take 1 tablet (50 mg total) by mouth 2 (two) times daily.   insulin aspart 100 UNIT/ML injection Commonly known as: novoLOG Inject 0-15 Units into the skin every 4 (four) hours.   meclizine 25 MG tablet Commonly known as: ANTIVERT Take 25 mg by mouth 3 (three) times daily as needed for dizziness or nausea.   melatonin 3 MG Tabs tablet Take 1 tablet (3 mg total) by mouth at bedtime as needed.   metoCLOPramide 10 MG  tablet Commonly known as: REGLAN Take 1 tablet (10 mg total) by mouth 4 (four) times daily -  before meals and at bedtime. What changed: when to take this   mycophenolate 180 MG EC tablet Commonly known as: MYFORTIC Take 1 tablet (180 mg total) by mouth 2 (two) times daily. What changed: how much to take   pantoprazole 40 MG tablet Commonly known as: PROTONIX Take 1 tablet (40 mg total) by mouth 2 (two) times daily.   polyethylene glycol 17 g packet Commonly known as:  MIRALAX / GLYCOLAX Take 17 g by mouth daily as needed for moderate constipation.   predniSONE 5 MG tablet Commonly known as: DELTASONE Take 5 mg by mouth daily.   rosuvastatin 20 MG tablet Commonly known as: CRESTOR Take 1 tablet (20 mg total) by mouth daily. What changed:  medication strength how much to take   sulfamethoxazole-trimethoprim 400-80 MG tablet Commonly known as: BACTRIM Take 1 tablet by mouth every Monday, Wednesday, and Friday.   terazosin 2 MG capsule Commonly known as: HYTRIN Take 2 capsules (4 mg total) by mouth at bedtime. What changed: how much to take        Follow-up Information     [name], MD Follow up in 1 week(s).   Specialty: Internal Medicine Contact information: [address] [place] [address] [phone]                  Time coordinating discharge: 39 minutes  Signed:  Jaylynne Birkhead  [facility] 09/30/2023, 10:04 AM

## 2023-09-29 NOTE — Progress Notes (Signed)
Pt BP 166/67. Howerter, MD notified. No new orders at this time.

## 2023-09-29 NOTE — Progress Notes (Signed)
IP rehab admissions - I have MD clearance and insurance approval for CIR admission.  Bed available and will admit to CIR today.  Call for questions.  [phone]

## 2023-09-29 NOTE — PMR Pre-admission (Signed)
PMR Admission Coordinator Pre-Admission Assessment   Patient: [name] is an 47 y.o., male MRN: [id number] DOB: 03-18-1976 Height: 5'10"   Weight: 81.7 kg    Insurance Information HMO:     PPO:      PCP:      IPA:      80/20:      OTHER:  PRIMARY: UHC Medicare       Policy#: [id number], Medicare: [id number]      Subscriber: Pt CM Name:       Phone#:[phone]      Fax#: [phone] Pre-Cert#: [id number]       Employer:  Benefits:  Phone #: [phone]     Name: Verified on [email or web address] Eff Date: 12/26/2022 - still active Deductible: $240 ($240 met) OOP Max: $8,850 ($9,518.84 met) CIR: $1,730 copay/ admission SNF: $0.00 Copayment per day for days 1-20; $204 Copayment per day for days 21-100; Maximum of 100 days/benefit period Outpatient: 80% coverage; 20% co-insurance Home Health:  100% coverage, limited by medical necessity DME: 80% coverage; 20% co-insurance Providers: in network   SECONDARY: Medicaid       Policy#: [id number]      Phone#: [phone]   Financial Counselor:       Phone#:    The "Data Collection Information Summary" for patients in Inpatient Rehabilitation Facilities with attached "Privacy Act Statement-Health Care Records" was provided and verbally reviewed with: Patient and Family   Emergency Contact Information Contact Information       Name Relation Home Work [place] Spouse [phone]   [phone]         Other Contacts       Name Relation Home Work Mobile    Milton,Sharon Aunt     [phone]           Current Medical History  Patient Admitting Diagnosis: L CVA  History of Present Illness:  A 47 year old male with a past medical history of hypertension, type 2 diabetes, PAD status post 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  Patient was at  his infusion center today getting his Belatacept infusion for chronic immunosuppressive therapy and during his infusion he became hypotensive and had full body burning sensation.  Patient did receive Benadryl at the infusion center and patient was transported to the [facility] for further evaluation and management.  On initial arrival to the emergency department patient was altered, and had blood pressure of 99/86.  Patient was altered, and minimally responsive.  Patient was given IM epinephrine with mild improvement in mental status.  Patient was mumbling and nonsensical, and therefore with concern for not protecting his own airway, patient was emergently intubated.  Patient subsequently had code stroke called.  Patient was admitted to the ICU for further evaluation management of altered mental status. Treated for E. coli UTI/bacteremia. Continues to have right hemiparesis with significant right arm weakness and mild right leg weakness.  He is speech is improving but remains nonfluent but able to speak sentences and follows commands well.  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 Extubated 9/26. Nausea treated with scheduled Reglan. AKI atop CKD II-IIIa. Nephrology consulted 9/30. Immunosuppressant drug regimen reviewed/adjusted. Good UOP and no significant PVRs. Tolerating diet. Patient requiring mod A bed mobility then mod x 2 to stand to STEDY then min A of 2 for safety. Participating with multiple  STS and standing weight shifting with therapist assist R UE and R knee ext. Decreased awareness of R sided deficits. 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 PT/OT/SLP evaluations completed with recommendations for acute inpatient rehab admission. Patient was not medically ready for CIR until yesterday so patient was not  admitted on 09/26/23.  Patient has been cleared by nephrology and by attending MD for CIR admission today.   Complete NIHSS TOTAL: 10   Patient's medical record from [facility] has been reviewed by the rehabilitation admission coordinator and physician.   Past Medical History      Past Medical History:  Diagnosis Date   AMPUTATION, BELOW KNEE, HX OF 04/08/2008   Arthritis      "I think I do; just in my fingers & my hands"   Blood transfusion     Cataract     Chronic pain     Depression      Patient states he has never been depressed.   Diabetes mellitus without complication [facility])      no since pancreas transplant   Dialysis patient [facility]) 04/18/2012    "[facility]; [place], [place], Sat"   Gastroparesis     Gastropathy     GERD (gastroesophageal reflux disease)     Hypertension     MRSA infection      over 10 years ago per patient. in legs          Has the patient had major surgery during 100 days prior to admission? No   Family History   family history includes Diabetes in his maternal grandmother, mother, paternal grandmother, and another family member; Hypertension in his mother and another family member; Kidney disease in his mother; Lung cancer in his maternal aunt.   Current Medications  Current Medications    Current Facility-Administered Medications:    Place/Maintain arterial line, , , Until Discontinued **AND** 0.9 %  sodium chloride infusion, , Intra-arterial, PRN, Paliwal, Aditya, MD   acetaminophen (TYLENOL) tablet 650 mg, 650 mg, Oral, Q4H PRN, Sharl Ma, Sarina Ill, MD, 650 mg at 09/23/23 1130   aspirin EC tablet 81 mg, 81 mg, Oral, Daily, Pearlean Brownie, Pramod S, MD, 81 mg at 09/23/23 1018   carvedilol (COREG) tablet 6.25 mg, 6.25 mg, Oral, BID WC, Duayne Cal, NP, 6.25 mg at 09/23/23 1653   Chlorhexidine Gluconate Cloth 2 % PADS 6 each, 6 each, Topical, Q0600, Lynnell Catalan, MD, 6 each at 09/23/23 0611   clopidogrel (PLAVIX) tablet 75  mg, 75 mg, Oral, Daily, Pearlean Brownie, Pramod S, MD, 75 mg at 09/23/23 1018   cycloSPORINE modified (GENGRAF) capsule 50 mg, 50 mg, Oral, BID, Annie Sable, MD, 50 mg at 09/23/23 2214   enoxaparin (LOVENOX) injection 40 mg, 40 mg, Subcutaneous, Daily, Whiteheart, Kathryn A, NP, 40 mg at 09/23/23 1018   hydrALAZINE (APRESOLINE) tablet 50 mg, 50 mg, Oral, BID, Chand, Sudham, MD, 50 mg at 09/23/23 2215   insulin aspart (novoLOG) injection 0-15 Units, 0-15 Units, Subcutaneous, Q4H, Whiteheart, Kathryn A, NP, 2 Units at 09/23/23 1638   metoCLOPramide (REGLAN) tablet 10 mg, 10 mg, Oral, TID AC & HS, Sharl Ma, Sarina Ill, MD, 10 mg at 09/24/23 1053   mycophenolate (MYFORTIC) EC tablet 360 mg, 360 mg, Oral, BID, Chand, Sudham, MD, 360 mg at 09/23/23 2214   Oral care mouth rinse, 15 mL, Mouth Rinse, PRN,  Lynnell Catalan, MD   Oral care mouth rinse, 15 mL, Mouth Rinse, PRN, Cheri Fowler, MD   Oral care mouth rinse, 15 mL, Mouth Rinse, PRN, Briant Sites, DO   piperacillin-tazobactam (ZOSYN) IVPB 3.375 g, 3.375 g, Intravenous, Q8H, Jacklynn Barnacle, RPH, Last Rate: 12.5 mL/hr at 09/24/23 0616, 3.375 g at 09/24/23 0616   polyethylene glycol (MIRALAX / GLYCOLAX) packet 17 g, 17 g, Oral, Daily PRN, Sharl Ma, Sarina Ill, MD   predniSONE (DELTASONE) tablet 5 mg, 5 mg, Oral, Daily, Knute Neu, RPH, 5 mg at 09/23/23 1017   rosuvastatin (CRESTOR) tablet 20 mg, 20 mg, Oral, Daily, Knute Neu, RPH, 20 mg at 09/23/23 1017   sulfamethoxazole-trimethoprim (BACTRIM) 400-80 MG per tablet 1 tablet, 1 tablet, Oral, Q M,W,F, Knute Neu, RPH, 1 tablet at 09/22/23 0847   terazosin (HYTRIN) capsule 2 mg, 2 mg, Oral, QHS, Sharl Ma, Gagan S, MD, 2 mg at 09/23/23 2215   vancomycin (VANCOCIN) IVPB 1000 mg/200 mL premix, 1,000 mg, Intravenous, Q24H, Jacklynn Barnacle, RPH     Patients Current Diet:  Diet Order                  DIET Regular Room service appropriate? Yes with Assist; Fluid consistency: Thin  Diet effective now                          Precautions / Restrictions Precautions Precautions: Fall, Other (comment) Precaution Comments: BP < 220/110 but avoid hypotension, bil BKA hx (prostheses in room) Restrictions Weight Bearing Restrictions: No    Has the patient had 2 or more falls or a fall with injury in the past year? No   Prior Activity Level Community (5-7x/wk): Pt. active in the communiyt PTA   Prior Functional Level Self Care: Did the patient need help bathing, dressing, using the toilet or eating? Independent   Indoor Mobility: Did the patient need assistance with walking from room to room (with or without device)? Independent   Stairs: Did the patient need assistance with internal or external stairs (with or without device)? Independent   Functional Cognition: Did the patient need help planning regular tasks such as shopping or remembering to take medications? Independent   Patient Information Are you of Hispanic, Latino/a,or Spanish origin?: A. No, not of Hispanic, Latino/a, or Spanish origin What is your race?: B. Black or African American Do you need or want an interpreter to communicate with a doctor or health care staff?: 0. No   Patient's Response To:  Health Literacy and Transportation Is the patient able to respond to health literacy and transportation needs?: Yes Health Literacy - How often do you need to have someone help you when you read instructions, pamphlets, or other written material from your doctor or pharmacy?: Never In the past 12 months, has lack of transportation kept you from medical appointments or from getting medications?: No In the past 12 months, has lack of transportation kept you from meetings, work, or from getting things needed for daily living?: No   Home Assistive Devices / Equipment Home Equipment: Hand held shower head   Prior Device Use: Indicate devices/aids used by the patient prior to current illness, exacerbation or injury? None of the  above   Current Functional Level Cognition   Arousal/Alertness: Awake/alert Overall Cognitive Status: Difficult to assess Difficult to assess due to: Impaired communication Current Attention Level: Sustained, Selective Orientation Level: Oriented to person, Disoriented to place, Disoriented to time,  Disoriented to situation Following Commands: Follows one step commands inconsistently, Follows one step commands with increased time Safety/Judgement: Decreased awareness of safety, Decreased awareness of deficits General Comments: decreased awareness of deficits, follows commands fairly consistently. very minimal responses/verbalizations, just "mhm". Attention: Selective Selective Attention: Appears intact Awareness: Impaired Awareness Impairment: Emergent impairment Problem Solving: Impaired Problem Solving Impairment: Verbal basic    Extremity Assessment (includes Sensation/Coordination)   Upper Extremity Assessment: Right hand dominant, RUE deficits/detail RUE Deficits / Details: flaccid, inattention to this UE, questionable sensation of this UE RUE Sensation: decreased proprioception RUE Coordination: decreased fine motor, decreased gross motor  Lower Extremity Assessment: Defer to PT evaluation RLE Deficits / Details: hx of BKA; R leg weaker than L with gross MMT scores of 2+ to 3-, inconsistent supine vs sitting; unsure of extent of sensation present in leg or not as pt perseverated on "fingertips" when asked where he was touched LLE Deficits / Details: hx of BKA, MMT scores grossly >/= 4     ADLs   Overall ADL's : Needs assistance/impaired Eating/Feeding: NPO Grooming: Moderate assistance, Sitting Upper Body Bathing: Moderate assistance, Sitting Upper Body Bathing Details (indicate cue type and reason): applying lotion to RUE using LUE, able to easily find hand to rub in lotion but required assist to reach to R elbow and bicep area to rub in lotion Lower Body Bathing: Maximal  assistance, Total assistance, Sitting/lateral leans, Sit to/from stand Upper Body Dressing : Moderate assistance, Sitting Lower Body Dressing: Maximal assistance, Total assistance, Sit to/from stand, Sitting/lateral leans Toilet Transfer Details (indicate cue type and reason): did not assess to date Toileting- Clothing Manipulation and Hygiene: Total assistance, Bed level Functional mobility during ADLs: Maximal assistance, +2 for safety/equipment, +2 for physical assistance, Cueing for safety, Cueing for sequencing General ADL Comments: Patient presenting with decreased attention/neglect to R side, cogntive impairments, decreased visual tracking, no activation in RUE, and minimal activation in RLE. Patient mod A to total A for ADL management.     Mobility   Overal bed mobility: Needs Assistance Bed Mobility: Supine to Sit Rolling: Mod assist Supine to sit: Min assist Sit to supine: Mod assist, +2 for physical assistance, +2 for safety/equipment, HOB elevated General bed mobility comments: in recliner     Transfers   Overall transfer level: Needs assistance Equipment used: 2 person hand held assist Transfers: Sit to/from Stand Sit to Stand: Mod assist, +2 physical assistance, +2 safety/equipment General transfer comment: Mod A x 2 for 2 standing trials from recliner with RUE support and R knee blocking. good strength and use of LUE to push from armrest to stand. Leaning towards Rt but with heavy cues can correct, pulling with LUE to midline.     Ambulation / Gait / Stairs / Wheelchair Mobility   Ambulation/Gait General Gait Details: Deferred for safety at this time.     Posture / Balance Dynamic Sitting Balance Sitting balance - Comments: Intermittent min assist to correct midline orientation in chair. Improved ability to use LUE to pull and reposition. Balance Overall balance assessment: Needs assistance Sitting-balance support: Single extremity supported, Feet unsupported Sitting  balance-Leahy Scale: Poor Sitting balance - Comments: Intermittent min assist to correct midline orientation in chair. Improved ability to use LUE to pull and reposition. Postural control: Posterior lean, Right lateral lean Standing balance support: Bilateral upper extremity supported Standing balance-Leahy Scale: Poor Standing balance comment: BIL UE support hand held with Rt knee block     Special needs/care consideration Skin intact and Special  service needs Bilateral BKAs    Previous Home Environment (from acute therapy documentation) Living Arrangements: Spouse/significant other  Lives With: Spouse Available Help at Discharge: Family Type of Home: House Home Layout: Two level, Able to live on main level with bedroom/bathroom Home Access: Stairs to enter Entrance Stairs-Rails: Right Entrance Stairs-Number of Steps: 4 Bathroom Shower/Tub: Engineer, manufacturing systems: Standard Bathroom Accessibility: Yes How Accessible: Accessible via walker Home Care Services: No   Discharge Living Setting Plans for Discharge Living Setting: Patient's home Type of Home at Discharge: House Discharge Home Layout: Two level, 1/2 bath on main level Alternate Level Stairs-Rails: Right Alternate Level Stairs-Number of Steps: 15 Discharge Home Access: Stairs to enter Entrance Stairs-Rails: Right, Left Entrance Stairs-Number of Steps: 4 Discharge Bathroom Shower/Tub: Tub/shower unit Discharge Bathroom Toilet: Standard Discharge Bathroom Accessibility: Yes How Accessible: Accessible via walker Does the patient have any problems obtaining your medications?: No   Social/Family/Support Systems Patient Roles: Spouse Contact Information: [name] Anticipated Caregiver: [phone] Anticipated Caregiver's Contact Information: 24/7 Ability/Limitations of Caregiver: Wife is ADON at [facility] [employer], will see if she can take FMLA or [facility] Discharge Plan Discussed with Primary Caregiver: Yes Is  Caregiver In Agreement with Plan?: Yes Does Caregiver/Family have Issues with Lodging/Transportation while Pt is in Rehab?: No   Goals Patient/Family Goal for Rehab: PT/OT/SLP min A - supervision Expected length of stay: 18-21 days Program Orientation Provided & Reviewed with Pt/Caregiver Including Roles  & Responsibilities: Yes   Decrease burden of Care through IP rehab admission: not anticipated                Possible need for SNF placement upon discharge: not anticipated           Patient Condition: I have reviewed medical records from [facility], spoken with CM, and patient and spouse. I met with patient at the bedside for inpatient rehabilitation assessment.  Patient will benefit from ongoing PT, OT, and SLP, can actively participate in 3 hours of therapy a day 5 days of the week, and can make measurable gains during the admission.  Patient will also benefit from the coordinated team approach during an Inpatient Acute Rehabilitation admission.  The patient will receive intensive therapy as well as Rehabilitation physician, nursing, social worker, and care management interventions.  Due to safety, skin/wound care, disease management, medication administration, pain management, and patient education the patient requires 24 hour a day rehabilitation nursing.  The patient is currently Mod A+2 with mobility and basic ADLs.  Discharge setting and therapy post discharge at home with home health is anticipated.  Patient has agreed to participate in the Acute Inpatient Rehabilitation Program and will admit today.   Preadmission Screen Completed By:  Jeronimo Greaves, 09/24/2023 11:43 AM and updated by Lelon Frohlich, RN on 09/29/23 at 1047 am. ______________________________________________________________________   Discussed status with Dr. Riley Kill  on 10/42024 at 1000 am and received approval for admission today.   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 Yes Co-Morbidities requiring supervision/potential complications: B/L BKAs; DM, s/p rneal and pancreatic transplant; bacteremia; ARF on CKD3a; urinary retention; hypertension; and resolved Acute resp failure Due to bladder management, bowel management, safety, skin/wound care,  disease management, medication administration, pain management, and patient education, does the patient require 24 hr/day rehab nursing? Yes Does the patient require coordinated care of a physician, rehab nurse, PT, OT, and SLP to address physical and functional deficits in the context of the above medical diagnosis(es)? Yes Addressing deficits in the following areas: balance, endurance, strength, transferring, bowel/bladder control, bathing, dressing, feeding, grooming, toileting, cognition, speech, and language Can the patient actively participate in an intensive therapy program of at least 3 hrs of therapy 5 days a week? Yes The potential for patient to make measurable gains while on inpatient rehab is good Anticipated functional outcomes upon discharge from inpatient rehab: supervision and min assist PT, supervision and min assist OT, supervision SLP Estimated rehab length of stay to reach the above functional goals is: 18-21 days Anticipated discharge destination: Home 10. Overall Rehab/Functional Prognosis: good     MD Signature:   Angelina Sheriff, DO 09/29/2023          Revision History

## 2023-09-29 NOTE — Progress Notes (Addendum)
Physical Therapy Treatment Patient Details Name: CURRIE HEDRICK MRN: [id number] DOB: 12-Dec-1976 Today's Date: 09/29/2023   History of Present Illness Pt is a 47 y.o. male who presented 09/18/23 from infusion center where he was receiving his Belatacept infusion for chronic immunosuppressive therapy and during his infusion he became hypotensive and had full body burning sensation. Pt received IM epinephrine on ED arrival with improvement in symptoms but noted difficulty speaking and facial droop. Code stroke activated. Imaging revealed scattered small foci of acute subcortical infarction in the left occipital lobe and posterior left frontal lobe and occluded left ICA. PMH includes HTN, DMII, PAD s/p bilateral BKA, renal and pancreatic transplant, depression, GERD.    PT Comments  Pt making steady progress with balance and mobility. Continues to have significant impairments in strength, balance, and rt inattention. Continue to feel patient will benefit from intensive inpatient follow up therapy, >3 hours/day    If plan is discharge home, recommend the following: Assist for transportation;Two people to help with walking and/or transfers;Two people to help with bathing/dressing/bathroom;Assistance with cooking/housework;Direct supervision/assist for medications management;Direct supervision/assist for financial management;Help with stairs or ramp for entrance   Can travel by private vehicle        Equipment Recommendations  Other (comment) (To be determined at next venue)    Recommendations for Other Services       Precautions / Restrictions Precautions Precautions: Fall;Other (comment) Precaution Comments: Bil BKA (prosthesis in room), R hemi, avoid hypotension Restrictions Weight Bearing Restrictions: No     Mobility  Bed Mobility Overal bed mobility: Needs Assistance Bed Mobility: Supine to Sit     Supine to sit: Min assist, HOB elevated, Used rails     General bed mobility  comments: Assist to elevate trunk into sitting and maintain balance while scooting hips forward. Heavy reliance on RUE pulling on foot board to sit up and scoot.    Transfers Overall transfer level: Needs assistance Equipment used: 2 person hand held assist, Ambulation equipment used Transfers: Sit to/from Stand, Bed to chair/wheelchair/BSC Sit to Stand: +2 physical assistance, Mod assist, Min assist           General transfer comment: Stood with +2 hand held from bed with +2 mod assist to bring hips up and assist with extension of lt knee, lt hip and trunk. Performed x 2. Stood from bed with Stedy with +1 min assist and +2 for safety. Bed to chair with Antony Salmon Transfer via Lift Equipment: Stedy  Ambulation/Gait             Pre-gait activities: Stood x 5 in [place] and with hand held. With Stedy standing 2 minutes with focus on activating rt quads and hips to promote extension     Stairs             Wheelchair Mobility     Tilt Bed    Modified Rankin (Stroke Patients Only)       Balance Overall balance assessment: Needs assistance Sitting-balance support: Feet supported, Single extremity supported Sitting balance-Leahy Scale: Poor Sitting balance - Comments: Static sitting without UE assist min guard. Dynamic activities including reaching in all directions requires min to mod assist for balance at times. Postural control: Right lateral lean Standing balance support: Single extremity supported Standing balance-Leahy Scale: Poor Standing balance comment: Stedy and min assist for static standing  Cognition Arousal: Alert Behavior During Therapy: Flat affect Overall Cognitive Status: Difficult to assess Area of Impairment: Following commands, Safety/judgement, Awareness, Problem solving                       Following Commands: Follows one step commands with increased time, Follows multi-step commands  inconsistently Safety/Judgement: Decreased awareness of deficits, Decreased awareness of safety Awareness: Emergent Problem Solving: Slow processing, Difficulty sequencing, Decreased initiation, Requires verbal cues, Requires tactile cues General Comments: Rt inattention with mod to max cues        Exercises      General Comments General comments (skin integrity, edema, etc.): Used mirror in sitting and standing for incr awareness      Pertinent Vitals/Pain Pain Assessment Pain Assessment: No/denies pain    Home Living                          Prior Function            PT Goals (current goals can now be found in the care plan section) Progress towards PT goals: Progressing toward goals    Frequency    Min 1X/week      PT Plan      Co-evaluation              AM-PAC PT "6 Clicks" Mobility   Outcome Measure  Help needed turning from your back to your side while in a flat bed without using bedrails?: A Little Help needed moving from lying on your back to sitting on the side of a flat bed without using bedrails?: A Little Help needed moving to and from a bed to a chair (including a wheelchair)?: Total Help needed standing up from a chair using your arms (e.g., wheelchair or bedside chair)?: Total Help needed to walk in hospital room?: Total Help needed climbing 3-5 steps with a railing? : Total 6 Click Score: 10    End of Session Equipment Utilized During Treatment: Gait belt Activity Tolerance: Patient tolerated treatment well Patient left: in chair;with call bell/phone within reach;with chair alarm set   PT Visit Diagnosis: Other symptoms and signs involving the nervous system (R29.898);Unsteadiness on feet (R26.81);Other abnormalities of gait and mobility (R26.89);Muscle weakness (generalized) (M62.81);Difficulty in walking, not elsewhere classified (R26.2);Hemiplegia and hemiparesis Hemiplegia - Right/Left: Right Hemiplegia -  dominant/non-dominant: Dominant Hemiplegia - caused by: Cerebral infarction     Time: 1610-9604 PT Time Calculation (min) (ACUTE ONLY): 27 min  Charges:    $Therapeutic Activity: 23-37 mins PT General Charges $$ ACUTE PT VISIT: 1 Visit                     [facility] PT Acute Rehabilitation Services Office [phone]    Angelina Ok [facility] 09/29/2023, 11:14 AM

## 2023-09-29 NOTE — Progress Notes (Signed)
[facility] Progress Note  Name: [name] MRN: [id number] DOB: 10-11-1976   Subjective:  Patient had 2.6 liters UOP over 10/3 as well as an unknown number of unmeasured urine voids.  He had 189 mL urine on post-void residual bladder scan on 10/2.  He had a straight cath for 550 mL on 10/3 after a post-void bladder scan of 355 mL.  He later had 130, 150, and 145 mL on post-void residual bladder scans overnight.  He has been on terazosin.  I reached out to his wife but did not reach her.  Left her a message that I would call her again tomorrow.   Review of systems:     Denies nausea/v/d Denies shortness of breath or chest pain Hx limited by known aphasia    Intake/Output Summary (Last 24 hours) at 09/29/2023 0825 Last data filed at 09/29/2023 2725 Gross per 24 hour  Intake 480 ml  Output 2550 ml  Net -2070 ml    Vitals:  Vitals:   09/29/23 0033 09/29/23 0400 09/29/23 0500 09/29/23 0812  BP: (!) 166/67 (!) 160/70  (!) 163/72  Pulse:    69  Resp:    18  Temp:  98.2 F (36.8 C)  98.7 F (37.1 C)  TempSrc:  Oral  Axillary  SpO2:  97%  97%  Weight:   81.7 kg      Physical Exam:     General:  adult male in bed in NAD at rest   HEENT: NCAT  Eyes: EOMI sclera anicteric Neck: supple trachea midline  Heart: S1S2 no rub Lungs: clear and unlabored; room air  Abdomen: soft/nt/nd Extremities: bilateral BKA's no edema of residual limbs  Skin: no rash on extremities exposed Psych no anxiety or agitation  Neuro: awake and interactive; has aphasia; nods or shakes head appropriately.  He is not able to tell me his name but does manage to say "what do I need to do?" GU - condom catheter in place  Medications reviewed   Labs:     Latest Ref Rng & Units 09/29/2023    6:44 AM 09/28/2023    8:05 AM 09/27/2023    5:08 AM  BMP  Glucose 70 - 99 mg/dL 366  440  347   BUN 6 - 20 mg/dL 12  15  18    Creatinine 0.61 - 1.24 mg/dL 4.25  9.56  3.87   Sodium 135 - 145  mmol/L 135  134  135   Potassium 3.5 - 5.1 mmol/L 3.6  3.4  3.4   Chloride 98 - 111 mmol/L 107  105  107   CO2 22 - 32 mmol/L 20  18  19    Calcium 8.9 - 10.3 mg/dL 8.2  8.0  8.0      Assessment/Plan:   # AKI  - may be hemodynamic/ischemic ATN in the setting of severe allergic reaction requiring intubation as well as pre-renal insults from gram negative bacteremia.  Transplant ultrasound without hydro - baseline Cr per the [facility] system appears to be in the high 1's - Continue supportive care - improving  - check another post-void residual bladder scan and in/out cath if indicated.      # Acute ischemic infarct - neurology has seen  - may be in setting of severe hypotensive event per neuro  - Per neuro note: "Occluded left ICA with reconstitution of the communicating segment."  - per primary team goal is for inpatient rehab    # Deceased donor  renal transplant  - with AKI of allograft as above - I have stopped myfortic in setting of bacteremia per his transplant center - he has a cyclosporine trough of 19 from 9/29 which does appear to be a true trough.  Increased cyclosporine to 100 mg BID.  Per his transplant center goal cyclosporine level is 50-125  - repeating cyclosporine trough today    # Pancreas transplant - note hyperglycemia  - amylase and lipase ok  - glucose management per primary team    # E coli bacteremia - per 09/23/23 blood cultures  - abx per primary team  - stopped myfortic as above given bacteremia  - will repeat blood cultures   # HTN  - Increase terazosin to 4 mg daily  - Continue other current regimen  - improved off of normal saline  # Urinary retention - s/p in/out cath - check post-void residual as above - increase terazosin to 4 mg daily   # Allergic reaction - to belatacept  - note his regimen has been adjusted   # Hypokalemia -replete    Please continue inpatient monitoring. Acceptable for transfer to inpatient rehab from a renal  standpoint   Estanislado Emms, MD 09/29/2023 8:25 AM

## 2023-09-29 NOTE — Progress Notes (Signed)
PMR Admission Coordinator Pre-Admission Assessment   Patient: [name] is an 47 y.o., male MRN: [id number] DOB: [birth year] Height: 5'10"   Weight: 81.7 kg    Insurance Information HMO:     PPO:      PCP:      IPA:      80/20:      OTHER:  PRIMARY: UHC Medicare       Policy#: [id number], Medicare: [id number]      Subscriber: Pt CM Name:       Phone#:[phone]      Fax#: [phone] Pre-Cert#: [id number]       Employer:  Benefits:  Phone #: [phone]     Name: Verified on [email or web address] Eff Date: 12/26/2022 - still active Deductible: $240 ($240 met) OOP Max: $8,850 ($4,403.47 met) CIR: $1,730 copay/ admission SNF: $0.00 Copayment per day for days 1-20; $204 Copayment per day for days 21-100; Maximum of 100 days/benefit period Outpatient: 80% coverage; 20% co-insurance Home Health:  100% coverage, limited by medical necessity DME: 80% coverage; 20% co-insurance Providers: in network   SECONDARY: Medicaid       Policy#: [id number]      Phone#: [phone]   Financial Counselor:       Phone#:    The "Data Collection Information Summary" for patients in Inpatient Rehabilitation Facilities with attached "Privacy Act Statement-Health Care Records" was provided and verbally reviewed with: Patient and Family   Emergency Contact Information Contact Information       Name Relation Home Work [place] Spouse [phone]   [phone]         Other Contacts       Name Relation Home Work Mobile    Milton,Sharon Aunt     [phone]           Current Medical History  Patient Admitting Diagnosis: L CVA   History of Present Illness: A 47 year old male with a past medical history of hypertension, type 2 diabetes, PAD status post 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  Patient was at his infusion center today getting his Belatacept infusion for chronic immunosuppressive therapy and during his infusion he became hypotensive and had full body burning sensation.  Patient did receive Benadryl at the infusion center and patient was transported to the [facility] for further evaluation and management.  On initial arrival to the emergency department patient was altered, and had blood pressure of 99/86.  Patient was altered, and minimally responsive.  Patient was given IM epinephrine with mild improvement in mental status.  Patient was mumbling and nonsensical, and therefore with concern for not protecting his own airway, patient was emergently intubated.  Patient subsequently had code stroke called.  Patient was admitted to the ICU for further evaluation management of altered mental status. Treated for E. coli UTI/bacteremia. Continues to have right hemiparesis with significant right arm weakness and mild right leg weakness.  He is speech is improving but remains nonfluent but able to speak sentences and follows commands well.  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 Extubated 9/26. Nausea treated with scheduled Reglan. AKI atop CKD II-IIIa. Nephrology consulted 9/30. Immunosuppressant drug regimen reviewed/adjusted. Good UOP and no significant PVRs. Tolerating diet. Patient requiring mod A bed mobility then mod x 2 to stand to STEDY then min A of 2 for safety. Participating  with multiple STS and standing weight shifting with therapist assist R UE and R knee ext. Decreased awareness of R sided deficits. 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 PT/OT/SLP evaluations completed with recommendations for acute inpatient rehab admission. Patient was not medically ready for CIR until  yesterday so patient was not admitted on 09/26/23.  Patient has been cleared by nephrology and by attending MD for CIR admission today.    Complete NIHSS TOTAL: 10   Patient's medical record from [facility] has been reviewed by the rehabilitation admission coordinator and physician.   Past Medical History         Past Medical History:  Diagnosis Date   AMPUTATION, BELOW KNEE, HX OF 04/08/2008   Arthritis      "I think I do; just in my fingers & my hands"   Blood transfusion     Cataract     Chronic pain     Depression      Patient states he has never been depressed.   Diabetes mellitus without complication [employer] [facility])      no since pancreas transplant   Dialysis patient [facility]) 04/18/2012    "[facility]; [place], [place], [place]"   Gastroparesis     Gastropathy     GERD (gastroesophageal reflux disease)     Hypertension     MRSA infection      over 10 years ago per patient. in legs           Has the patient had major surgery during 100 days prior to admission? No   Family History   family history includes Diabetes in his maternal grandmother, mother, paternal grandmother, and another family member; Hypertension in his mother and another family member; Kidney disease in his mother; Lung cancer in his maternal aunt.   Current Medications   Current Medications    Current Facility-Administered Medications:    Place/Maintain arterial line, , , Until Discontinued **AND** 0.9 %  sodium chloride infusion, , Intra-arterial, PRN, Paliwal, Aditya, MD   acetaminophen (TYLENOL) tablet 650 mg, 650 mg, Oral, Q4H PRN, Sharl Ma, Sarina Ill, MD, 650 mg at 09/23/23 1130   aspirin EC tablet 81 mg, 81 mg, Oral, Daily, Pearlean Brownie, Pramod S, MD, 81 mg at 09/23/23 1018   carvedilol (COREG) tablet 6.25 mg, 6.25 mg, Oral, BID WC, Duayne Cal, NP, 6.25 mg at 09/23/23 1653   Chlorhexidine Gluconate Cloth 2 % PADS 6 each, 6 each, Topical, Q0600, Lynnell Catalan, MD, 6 each at 09/23/23  0611   clopidogrel (PLAVIX) tablet 75 mg, 75 mg, Oral, Daily, Pearlean Brownie, Pramod S, MD, 75 mg at 09/23/23 1018   cycloSPORINE modified (GENGRAF) capsule 50 mg, 50 mg, Oral, BID, Annie Sable, MD, 50 mg at 09/23/23 2214   enoxaparin (LOVENOX) injection 40 mg, 40 mg, Subcutaneous, Daily, Whiteheart, Kathryn A, NP, 40 mg at 09/23/23 1018   hydrALAZINE (APRESOLINE) tablet 50 mg, 50 mg, Oral, BID, Chand, Sudham, MD, 50 mg at 09/23/23 2215   insulin aspart (novoLOG) injection 0-15 Units, 0-15 Units, Subcutaneous, Q4H, Whiteheart, Kathryn A, NP, 2 Units at 09/23/23 1638   metoCLOPramide (REGLAN) tablet 10 mg, 10 mg, Oral, TID AC & HS, Sharl Ma, Sarina Ill, MD, 10 mg at 09/24/23 1053   mycophenolate (MYFORTIC) EC tablet 360 mg, 360 mg, Oral, BID, Cheri Fowler, MD, 360 mg at 09/23/23 2214   Oral  care mouth rinse, 15 mL, Mouth Rinse, PRN, Agarwala, Ravi, MD   Oral care mouth rinse, 15 mL, Mouth Rinse, PRN, Cheri Fowler, MD   Oral care mouth rinse, 15 mL, Mouth Rinse, PRN, Briant Sites, DO   piperacillin-tazobactam (ZOSYN) IVPB 3.375 g, 3.375 g, Intravenous, Q8H, Jacklynn Barnacle, RPH, Last Rate: 12.5 mL/hr at 09/24/23 0616, 3.375 g at 09/24/23 0616   polyethylene glycol (MIRALAX / GLYCOLAX) packet 17 g, 17 g, Oral, Daily PRN, Sharl Ma, Gagan S, MD   predniSONE (DELTASONE) tablet 5 mg, 5 mg, Oral, Daily, Knute Neu, RPH, 5 mg at 09/23/23 1017   rosuvastatin (CRESTOR) tablet 20 mg, 20 mg, Oral, Daily, Knute Neu, RPH, 20 mg at 09/23/23 1017   sulfamethoxazole-trimethoprim (BACTRIM) 400-80 MG per tablet 1 tablet, 1 tablet, Oral, Q M,W,F, Knute Neu, RPH, 1 tablet at 09/22/23 0847   terazosin (HYTRIN) capsule 2 mg, 2 mg, Oral, QHS, Sharl Ma, Gagan S, MD, 2 mg at 09/23/23 2215   vancomycin (VANCOCIN) IVPB 1000 mg/200 mL premix, 1,000 mg, Intravenous, Q24H, Jacklynn Barnacle, RPH      Patients Current Diet:  Diet Order                  DIET Regular Room service appropriate? Yes with Assist; Fluid  consistency: Thin  Diet effective now                         Precautions / Restrictions Precautions Precautions: Fall, Other (comment) Precaution Comments: BP < 220/110 but avoid hypotension, bil BKA hx (prostheses in room) Restrictions Weight Bearing Restrictions: No    Has the patient had 2 or more falls or a fall with injury in the past year? No   Prior Activity Level Community (5-7x/wk): Pt. active in the communiyt PTA   Prior Functional Level Self Care: Did the patient need help bathing, dressing, using the toilet or eating? Independent   Indoor Mobility: Did the patient need assistance with walking from room to room (with or without device)? Independent   Stairs: Did the patient need assistance with internal or external stairs (with or without device)? Independent   Functional Cognition: Did the patient need help planning regular tasks such as shopping or remembering to take medications? Independent   Patient Information Are you of Hispanic, Latino/a,or Spanish origin?: A. No, not of Hispanic, Latino/a, or Spanish origin What is your race?: B. Black or African American Do you need or want an interpreter to communicate with a doctor or health care staff?: 0. No   Patient's Response To:  Health Literacy and Transportation Is the patient able to respond to health literacy and transportation needs?: Yes Health Literacy - How often do you need to have someone help you when you read instructions, pamphlets, or other written material from your doctor or pharmacy?: Never In the past 12 months, has lack of transportation kept you from medical appointments or from getting medications?: No In the past 12 months, has lack of transportation kept you from meetings, work, or from getting things needed for daily living?: No   Home Assistive Devices / Equipment Home Equipment: Hand held shower head   Prior Device Use: Indicate devices/aids used by the patient prior to current  illness, exacerbation or injury? None of the above   Current Functional Level Cognition   Arousal/Alertness: Awake/alert Overall Cognitive Status: Difficult to assess Difficult to assess due to: Impaired communication Current Attention Level: Sustained, Selective Orientation Level:  Oriented to person, Disoriented to place, Disoriented to time, Disoriented to situation Following Commands: Follows one step commands inconsistently, Follows one step commands with increased time Safety/Judgement: Decreased awareness of safety, Decreased awareness of deficits General Comments: decreased awareness of deficits, follows commands fairly consistently. very minimal responses/verbalizations, just "mhm". Attention: Selective Selective Attention: Appears intact Awareness: Impaired Awareness Impairment: Emergent impairment Problem Solving: Impaired Problem Solving Impairment: Verbal basic    Extremity Assessment (includes Sensation/Coordination)   Upper Extremity Assessment: Right hand dominant, RUE deficits/detail RUE Deficits / Details: flaccid, inattention to this UE, questionable sensation of this UE RUE Sensation: decreased proprioception RUE Coordination: decreased fine motor, decreased gross motor  Lower Extremity Assessment: Defer to PT evaluation RLE Deficits / Details: hx of BKA; R leg weaker than L with gross MMT scores of 2+ to 3-, inconsistent supine vs sitting; unsure of extent of sensation present in leg or not as pt perseverated on "fingertips" when asked where he was touched LLE Deficits / Details: hx of BKA, MMT scores grossly >/= 4     ADLs   Overall ADL's : Needs assistance/impaired Eating/Feeding: NPO Grooming: Moderate assistance, Sitting Upper Body Bathing: Moderate assistance, Sitting Upper Body Bathing Details (indicate cue type and reason): applying lotion to RUE using LUE, able to easily find hand to rub in lotion but required assist to reach to R elbow and bicep area to  rub in lotion Lower Body Bathing: Maximal assistance, Total assistance, Sitting/lateral leans, Sit to/from stand Upper Body Dressing : Moderate assistance, Sitting Lower Body Dressing: Maximal assistance, Total assistance, Sit to/from stand, Sitting/lateral leans Toilet Transfer Details (indicate cue type and reason): did not assess to date Toileting- Clothing Manipulation and Hygiene: Total assistance, Bed level Functional mobility during ADLs: Maximal assistance, +2 for safety/equipment, +2 for physical assistance, Cueing for safety, Cueing for sequencing General ADL Comments: Patient presenting with decreased attention/neglect to R side, cogntive impairments, decreased visual tracking, no activation in RUE, and minimal activation in RLE. Patient mod A to total A for ADL management.     Mobility   Overal bed mobility: Needs Assistance Bed Mobility: Supine to Sit Rolling: Mod assist Supine to sit: Min assist Sit to supine: Mod assist, +2 for physical assistance, +2 for safety/equipment, HOB elevated General bed mobility comments: in recliner     Transfers   Overall transfer level: Needs assistance Equipment used: 2 person hand held assist Transfers: Sit to/from Stand Sit to Stand: Mod assist, +2 physical assistance, +2 safety/equipment General transfer comment: Mod A x 2 for 2 standing trials from recliner with RUE support and R knee blocking. good strength and use of LUE to push from armrest to stand. Leaning towards Rt but with heavy cues can correct, pulling with LUE to midline.     Ambulation / Gait / Stairs / Wheelchair Mobility   Ambulation/Gait General Gait Details: Deferred for safety at this time.     Posture / Balance Dynamic Sitting Balance Sitting balance - Comments: Intermittent min assist to correct midline orientation in chair. Improved ability to use LUE to pull and reposition. Balance Overall balance assessment: Needs assistance Sitting-balance support: Single  extremity supported, Feet unsupported Sitting balance-Leahy Scale: Poor Sitting balance - Comments: Intermittent min assist to correct midline orientation in chair. Improved ability to use LUE to pull and reposition. Postural control: Posterior lean, Right lateral lean Standing balance support: Bilateral upper extremity supported Standing balance-Leahy Scale: Poor Standing balance comment: BIL UE support hand held with Rt knee block  Special needs/care consideration Skin intact and Special service needs Bilateral BKAs    Previous Home Environment (from acute therapy documentation) Living Arrangements: Spouse/significant other  Lives With: Spouse Available Help at Discharge: Family Type of Home: House Home Layout: Two level, Able to live on main level with bedroom/bathroom Home Access: Stairs to enter Entrance Stairs-Rails: Right Entrance Stairs-Number of Steps: 4 Bathroom Shower/Tub: Engineer, manufacturing systems: Standard Bathroom Accessibility: Yes How Accessible: Accessible via walker Home Care Services: No   Discharge Living Setting Plans for Discharge Living Setting: Patient's home Type of Home at Discharge: House Discharge Home Layout: Two level, 1/2 bath on main level Alternate Level Stairs-Rails: Right Alternate Level Stairs-Number of Steps: 15 Discharge Home Access: Stairs to enter Entrance Stairs-Rails: Right, Left Entrance Stairs-Number of Steps: 4 Discharge Bathroom Shower/Tub: Tub/shower unit Discharge Bathroom Toilet: Standard Discharge Bathroom Accessibility: Yes How Accessible: Accessible via walker Does the patient have any problems obtaining your medications?: No   Social/Family/Support Systems Patient Roles: Spouse Contact Information: braylon [place] Anticipated Caregiver: [phone] Anticipated Caregiver's Contact Information: 24/7 Ability/Limitations of Caregiver: Wife is ADON at [employer], will see if she can take FMLA or [facility] Discharge  Plan Discussed with Primary Caregiver: Yes Is Caregiver In Agreement with Plan?: Yes Does Caregiver/Family have Issues with Lodging/Transportation while Pt is in Rehab?: No   Goals Patient/Family Goal for Rehab: PT/OT/SLP min A - supervision Expected length of stay: 18-21 days Program Orientation Provided & Reviewed with Pt/Caregiver Including Roles  & Responsibilities: Yes   Decrease burden of Care through IP rehab admission: not anticipated                Possible need for SNF placement upon discharge: not anticipated           Patient Condition: I have reviewed medical records from [facility], spoken with CM, and patient and spouse. I met with patient at the bedside for inpatient rehabilitation assessment.  Patient will benefit from ongoing PT, OT, and SLP, can actively participate in 3 hours of therapy a day 5 days of the week, and can make measurable gains during the admission.  Patient will also benefit from the coordinated team approach during an Inpatient Acute Rehabilitation admission.  The patient will receive intensive therapy as well as Rehabilitation physician, nursing, social worker, and care management interventions.  Due to safety, skin/wound care, disease management, medication administration, pain management, and patient education the patient requires 24 hour a day rehabilitation nursing.  The patient is currently Mod A+2 with mobility and basic ADLs.  Discharge setting and therapy post discharge at home with home health is anticipated.  Patient has agreed to participate in the Acute Inpatient Rehabilitation Program and will admit today.   Preadmission Screen Completed By:  Jeronimo Greaves, 09/24/2023 11:43 AM and updated by Lelon Frohlich, RN on 09/29/23 at 1047 am. ______________________________________________________________________   Discussed status with Dr. Riley Kill  on 10/42024 at 1000 am and received approval for admission today.   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 Yes Co-Morbidities requiring supervision/potential complications: B/L BKAs; DM, s/p rneal and pancreatic transplant; bacteremia; ARF on CKD3a; urinary retention; hypertension; and resolved Acute resp failure Due to  bladder management, bowel management, safety, skin/wound care, disease management, medication administration, pain management, and patient education, does the patient require 24 hr/day rehab nursing? Yes Does the patient require coordinated care of a physician, rehab nurse, PT, OT, and SLP to address physical and functional deficits in the context of the above medical diagnosis(es)? Yes Addressing deficits in the following areas: balance, endurance, strength, transferring, bowel/bladder control, bathing, dressing, feeding, grooming, toileting, cognition, speech, and language Can the patient actively participate in an intensive therapy program of at least 3 hrs of therapy 5 days a week? Yes The potential for patient to make measurable gains while on inpatient rehab is good Anticipated functional outcomes upon discharge from inpatient rehab: supervision and min assist PT, supervision and min assist OT, supervision SLP Estimated rehab length of stay to reach the above functional goals is: 18-21 days Anticipated discharge destination: Home 10. Overall Rehab/Functional Prognosis: good     MD Signature:   Angelina Sheriff, DO 09/29/2023            Revision History            Revision History  Routing History

## 2023-09-29 NOTE — TOC Transition Note (Signed)
Transition of Care [facility]) - CM/SW Discharge Note   Patient Details  Name: [name] MRN: [id number] Date of Birth: [birth year]  [facility] CM/SW Contact:  Kermit Balo, RN Phone Number: 09/29/2023, 10:56 AM   Clinical Narrative:    Pt is discharging to CIR today. CM signing off.    Final next level of care: IP Rehab Facility Barriers to Discharge: No Barriers Identified   Patient Goals and CMS Choice CMS Medicare.gov Compare Post Acute Care list provided to:: Patient Represenative (must comment) Choice offered to / list presented to : Spouse  Discharge Placement                         Discharge Plan and Services Additional resources added to the After Visit Summary for     Discharge Planning Services: CM Consult Post Acute Care Choice: IP Rehab                               Social Determinants of Health (SDOH) Interventions SDOH Screenings   Food Insecurity: No Food Insecurity (09/20/2023)  Housing: Low Risk  (09/20/2023)  Transportation Needs: No Transportation Needs (09/20/2023)  Utilities: Not At Risk (09/20/2023)  Alcohol Screen: Low Risk  (09/06/2022)  Depression (PHQ2-9): Low Risk  (09/11/2023)  Financial Resource Strain: Low Risk  (09/11/2023)  Physical Activity: Inactive (09/11/2023)  Social Connections: Socially Integrated (09/11/2023)  Stress: No Stress Concern Present (09/11/2023)  Tobacco Use: Medium Risk (09/18/2023)  Health Literacy: Adequate Health Literacy (09/11/2023)     Readmission Risk Interventions    09/06/2023   12:10 PM  Readmission Risk Prevention Plan  Transportation Screening Complete  PCP or Specialist Appt within 5-7 Days Complete  Home Care Screening Complete  Medication Review (RN CM) Referral to Pharmacy

## 2023-09-29 NOTE — H&P (Addendum)
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  Patient was at his infusion center today getting his Belatacept infusion for chronic immunosuppressive therapy and during his infusion he became hypotensive and had full body burning sensation.  Patient did receive Benadryl at the infusion center and patient was transported to the [facility] for further evaluation and management.  On initial arrival to the emergency department patient was altered, and had blood pressure of 99/86.  Patient was altered, and minimally responsive.  Patient was given IM epinephrine with mild improvement in mental status.  Patient was mumbling and nonsensical, and therefore with concern for not protecting his own airway, patient was emergently intubated.  Patient subsequently had code stroke called.  Patient was admitted to the ICU for further evaluation management of altered mental status. Treated for E. coli UTI/bacteremia. Continues to have right hemiparesis with significant right arm weakness and mild right leg weakness.  He is speech is improving but remains nonfluent but able to speak sentences and follows commands well.  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 Extubated 9/26. Nausea treated with scheduled Reglan. AKI atop CKD II-IIIa. Nephrology consulted 9/30. Immunosuppressant drug regimen reviewed/adjusted. Good UOP and no significant PVRs. Tolerating diet. Patient requiring mod A bed mobility then mod x 2 to stand to STEDY then min A of  2 for safety. Participating with multiple STS and standing weight shifting with therapist assist R UE and R knee ext. Decreased awareness of R sided deficits. 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   He is sitting up in bed with lunch tray. Spilled food on gown. He denies nausea or vomiting. On regular diet with assistance. Says prostheses fit well. (At bedside)   Review of Systems  Constitutional:  Negative for chills and fever.  HENT:  Negative for congestion and sore throat.   Eyes:  Negative for blurred vision and double vision.  Respiratory:  Negative for cough and shortness of breath.   Cardiovascular:  Negative for chest pain and palpitations.  Gastrointestinal:  Negative for constipation, nausea and vomiting.  Genitourinary:  Negative for dysuria and urgency.  Neurological:  Negative for dizziness and headaches.  Psychiatric/Behavioral:  Negative for depression.        Poor sleep        Past Medical History:  Diagnosis Date   AMPUTATION, BELOW KNEE, HX OF 04/08/2008   Arthritis      "I think I do; just in my fingers & my hands"   Blood transfusion     Cataract     Chronic pain     Depression      Patient states he has never been depressed.   Diabetes mellitus without complication [facility])      no since pancreas transplant   Dialysis patient [facility]) 04/18/2012    "[facility]; [place], [place], [place]"   Gastroparesis     Gastropathy     GERD (gastroesophageal reflux  disease)     Hypertension     MRSA infection      over 10 years ago per patient. in legs             Past Surgical History:  Procedure Laterality Date   AV FISTULA PLACEMENT   08/2011    left upper arm   BELOW KNEE LEG AMPUTATION   "it's been awhile"    bilaterally   CATARACT EXTRACTION   ~ 2011    right   COMBINED  KIDNEY-PANCREAS TRANSPLANT   2014   ESOPHAGOGASTRODUODENOSCOPY N/A 12/30/2016    Procedure: ESOPHAGOGASTRODUODENOSCOPY (EGD);  Surgeon: Jeani Hawking, MD;  Location: [facility] ENDOSCOPY;  Service: Endoscopy;  Laterality: N/A;   OLECRANON BURSECTOMY Right 06/23/2020    Procedure: RIGHT ELBOW EXCISION OLECRANON BURSITIS;  Surgeon: Nadara Mustard, MD;  Location: [facility];  Service: Orthopedics;  Laterality: Right;             Family History  Problem Relation Age of Onset   Hypertension Mother     Diabetes Mother     Kidney disease Mother     Diabetes Maternal Grandmother     Diabetes Paternal Grandmother     Diabetes Other     Hypertension Other     Lung cancer Maternal Aunt     Colon cancer Neg Hx     Esophageal cancer Neg Hx     Rectal cancer Neg Hx     Stomach cancer Neg Hx          Social History:  reports that he quit smoking about 14 months ago. His smoking use included cigars. He has never used smokeless tobacco. He reports that he does not currently use drugs after having used the following drugs: Marijuana. Frequency: 3.00 times per week. He reports that he does not drink alcohol. Allergies:  Allergies      Allergies  Allergen Reactions   Amlodipine Rash   Lisinopril Rash            Medications Prior to Admission  Medication Sig Dispense Refill   belatacept (NULOJIX) 250 MG SOLR injection Inject into the vein every 30 (thirty) days.       carvedilol (COREG) 6.25 MG tablet Take 1 tablet (6.25 mg total) by mouth 2 (two) times daily with a meal. 180 tablet 0   ergocalciferol (VITAMIN D2) 1.25 MG (50000 UT) capsule Take 1 capsule (50,000 Units total) by mouth once a week. (Patient taking differently: Take 1 capsule by mouth once a week. Wednesday) 4 capsule 3   fluocinonide cream (LIDEX) 0.05 % Apply one application to affected areas twice daily 120 g 2   hydrALAZINE (APRESOLINE) 50 MG tablet Take 1 tablet (50 mg total) by mouth 4 (four) times daily. (Patient taking  differently: Take 50 mg by mouth 2 (two) times daily.) 360 tablet 0   meclizine (ANTIVERT) 25 MG tablet Take 25 mg by mouth 3 (three) times daily as needed for dizziness or nausea.       metoCLOPramide (REGLAN) 10 MG tablet Take 10 mg by mouth 2 (two) times daily.       mycophenolate (MYFORTIC) 180 MG EC tablet Take 360 mg by mouth 2 (two) times daily.       omeprazole (PRILOSEC) 40 MG capsule Take 40 mg by mouth daily.       ondansetron (ZOFRAN) 4 MG tablet Take 4 mg by mouth 2 (two) times daily as needed for nausea or vomiting.  predniSONE (DELTASONE) 5 MG tablet Take 5 mg by mouth daily.       sulfamethoxazole-trimethoprim (BACTRIM,SEPTRA) 400-80 MG per tablet Take 1 tablet by mouth every Monday, Wednesday, and Friday.        terazosin (HYTRIN) 2 MG capsule Take 1 capsule (2 mg total) by mouth at bedtime. 90 capsule 0   rosuvastatin (CRESTOR) 10 MG tablet Take 1 tablet (10 mg total) by mouth daily. (Patient not taking: Reported on 09/18/2023) 90 tablet 1              Home: Home Living Family/patient expects to be discharged to:: Inpatient rehab (or home) Living Arrangements: Spouse/significant other Available Help at Discharge: Family Type of Home: House Home Access: Stairs to enter Secretary/administrator of Steps: 4 Entrance Stairs-Rails: Right Home Layout: Two level, Able to live on main level with bedroom/bathroom Bathroom Shower/Tub: Engineer, manufacturing systems: Standard Bathroom Accessibility: Yes Home Equipment: Hand held shower head  Lives With: Spouse   Functional History: Prior Function Prior Level of Function : Independent/Modified Independent, Driving, Working/employed Mobility Comments: No AD using bil prostheses to ambulate ADLs Comments: 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  He could roll to R with mod I but min A to L.  Cues for technique to roll to L then push up with L UE, does require mod A to prevent falling back down when shifting L UE position Transfers Overall transfer level: Needs assistance Equipment used:  (STEDY) Transfers: Sit to/from Stand Sit to Stand: Mod assist, +2 physical assistance Bed to/from chair/wheelchair/BSC transfer type:: Stand pivot Stand pivot transfers: Max assist, +2 physical assistance, +2 safety/equipment General transfer comment: Pt performed STS x 3 into STEDY with heavy dependency on L UE and LE, but light mod A of 2 from PT and tech.  PT assisting on R side to provide support to UE then straighten R knee. Pt in chair earlier for a few hours, reports fatigued, returned to supine. Ambulation/Gait General Gait Details: Deferred for safety at this time.   ADL: ADL Overall ADL's : Needs assistance/impaired Eating/Feeding: NPO Grooming: Moderate assistance, Sitting Upper Body Bathing: Moderate assistance, Sitting Upper Body Bathing Details (indicate cue type and reason): applying lotion to RUE using LUE, able to easily find hand to rub in lotion but required assist to reach to R elbow and bicep area to rub in lotion Lower Body Bathing: Maximal assistance, Total assistance, Sitting/lateral leans, Sit to/from stand Upper Body Dressing : Moderate assistance, Sitting Lower Body Dressing: Maximal assistance, Total assistance, Sit to/from stand, Sitting/lateral leans Toilet Transfer: Maximal assistance, +2 for physical assistance, +2 for safety/equipment, Stand-pivot, BSC/3in1 Toilet Transfer Details (indicate cue type and reason): did not assess to date Toileting- Architect and Hygiene: Total assistance, Sitting/lateral lean, Sit to/from stand Toileting - Clothing Manipulation  Details (indicate cue type and reason): unable to complete peri-care in standing Functional mobility during ADLs: Maximal assistance, +2 for safety/equipment, +2 for physical assistance, Cueing for safety, Cueing for sequencing General ADL Comments: Session focos on increasing overall awareness to R side, toileting, and working on finding midline with use of the mirror at the sink. Patient with improved command following  in session, however requires max cues in order to pace himself with movements due to increased impulsivity. Patient also noted to get overwhelmed with lack of communication, benefitting from reassurance and cues to take some deep breaths and attempt again (improved expressive language noted with breaks).   Cognition: Cognition Overall Cognitive Status: Difficult to assess Arousal/Alertness: Awake/alert Orientation Level: Oriented to person, Oriented to place, Oriented to situation, Disoriented to time Attention: Selective Selective Attention: Appears intact Awareness: Impaired Awareness Impairment: Emergent impairment Problem Solving: Impaired Problem Solving Impairment: Verbal basic Cognition Arousal: Alert Behavior During Therapy: Flat affect Overall Cognitive Status: Difficult to assess Area of Impairment: Following commands, Safety/judgement, Problem solving, Attention Orientation Level: Disoriented to Current Attention Level: Selective Memory: Decreased recall of precautions Following Commands: Follows one step commands with increased time, Follows multi-step commands inconsistently Safety/Judgement: Decreased awareness of deficits, Decreased awareness of safety Awareness: Emergent Problem Solving: Slow processing, Difficulty sequencing, Decreased initiation, Requires verbal cues, Requires tactile cues General Comments: Pt with some impulsiveness, decreased awareness of deficits, and R inattention.  When sitting pt would let go with L hand to try to assist with  prostheses and fall hard/quick to R requiring assist to maintain balance.  Pt did say "yes and no" appropriately during session.  He would attempt to say more but only able to get out 1-2 words then aphasic/word finding difficulty. Did say "I want to try" multiple times Difficult to assess due to: Impaired communication   Physical Exam: Blood pressure 135/64, pulse 70, temperature 98.7 F (37.1 C), temperature source Oral, resp. rate 18, weight 87.6 kg, SpO2 95%.  Constitutional: No apparent distress. Appropriate appearance for age.  HENT: No JVD. Neck Supple. Trachea midline. Atraumatic, normocephalic.  Eyes: PERRLA. EOMI. Visual fields grossly intact. Cardiovascular: RRR, no murmurs/rub/gallops. Peripheral pulses 2+  Respiratory: CTAB. No rales, rhonchi, or wheezing. On RA.  Abdomen: + bowel sounds, normoactive. No distention or tenderness. GU: Not examined. +purwick, draining clear urine.  Skin: C/D/I. No apparent lesions.  Unable to don/doff lower extremity prosthetics in current position to examine skin underneath.  MSK:      Lateral BKA's.  Pin system sleeves and prostheses donned, appear well-fitting.       Neurologic exam:  Cognition: AAO to hospital and year with options; not oriented to self, insists month is February. + Mild cognitive delay, apparent difficulty with concentration but can follow simple commands with 1-2 cues + R inattention - easily overcome with stimuli  Language:  + Word finding difficulty and substitutions + Speech delay + Dysarthria Names 0/3 objects correctly.   Insight: Poor insight into current condition.  Mood: Pleasant affect, appropriate mood.  Sensation: To light touch intact in BL UEs and LEs  Reflexes: Hyporeflexic right upper and lower extremity. Negative Hoffman's and babinski signs bilaterally.  CN: + Mild left facial droop. + Decreased shoulder shrug on right.  Spasticity: MAS 0 in all extremities.  Strength:                RUE: 0/5, no  volitional motor initiation throughout                LUE: 5/5 SA, 5/5 EF, 5/5 EE, 5/5 WE, 5/5 FF, 5/5 FA                 RLE: 0/5, no volitional motor initiation throughout - ? Due to motor delay, prior documented some withdrawal to pain  LLE:  5/5 HF, 5/5 KE        Lab Results Last 48 Hours        Results for orders placed or performed during the hospital encounter of 09/18/23 (from the past 48 hour(s))  Basic metabolic panel     Status: Abnormal    Collection Time: 09/26/23 11:04 AM  Result Value Ref Range    Sodium 134 (L) 135 - 145 mmol/L    Potassium 3.5 3.5 - 5.1 mmol/L    Chloride 105 98 - 111 mmol/L    CO2 21 (L) 22 - 32 mmol/L    Glucose, Bld 115 (H) 70 - 99 mg/dL      Comment: Glucose reference range applies only to samples taken after fasting for at least 8 hours.    BUN 19 6 - 20 mg/dL    Creatinine, Ser 1.91 (H) 0.61 - 1.24 mg/dL    Calcium 8.0 (L) 8.9 - 10.3 mg/dL    GFR, Estimated 26 (L) >60 mL/min      Comment: (NOTE) Calculated using the CKD-EPI Creatinine Equation (2021)      Anion gap 8 5 - 15      Comment: Performed at [facility], [address] [place], [place] [address]  Glucose, capillary     Status: Abnormal    Collection Time: 09/26/23 12:16 PM  Result Value Ref Range    Glucose-Capillary 170 (H) 70 - 99 mg/dL      Comment: Glucose reference range applies only to samples taken after fasting for at least 8 hours.    Comment 1 Notify RN      Comment 2 Document in Chart    Glucose, capillary     Status: Abnormal    Collection Time: 09/26/23  4:29 PM  Result Value Ref Range    Glucose-Capillary 147 (H) 70 - 99 mg/dL      Comment: Glucose reference range applies only to samples taken after fasting for at least 8 hours.    Comment 1 Notify RN      Comment 2 Document in Chart    Glucose, capillary     Status: Abnormal    Collection Time: 09/26/23  7:59 PM  Result Value Ref Range    Glucose-Capillary 201 (H) 70 - 99 mg/dL       Comment: Glucose reference range applies only to samples taken after fasting for at least 8 hours.    Comment 1 Notify RN    Glucose, capillary     Status: Abnormal    Collection Time: 09/26/23 11:44 PM  Result Value Ref Range    Glucose-Capillary 115 (H) 70 - 99 mg/dL      Comment: Glucose reference range applies only to samples taken after fasting for at least 8 hours.  Glucose, capillary     Status: Abnormal    Collection Time: 09/27/23  3:31 AM  Result Value Ref Range    Glucose-Capillary 121 (H) 70 - 99 mg/dL      Comment: Glucose reference range applies only to samples taken after fasting for at least 8 hours.  Magnesium     Status: None    Collection Time: 09/27/23  5:08 AM  Result Value Ref Range    Magnesium 1.9 1.7 - 2.4 mg/dL      Comment: Performed at [facility], [address], [place], [place] [address]  Phosphorus     Status: None    Collection Time: 09/27/23  5:08  AM  Result Value Ref Range    Phosphorus 3.0 2.5 - 4.6 mg/dL      Comment: Performed at [facility], [address] [place], [place] [address]  Basic metabolic panel     Status: Abnormal    Collection Time: 09/27/23  5:08 AM  Result Value Ref Range    Sodium 135 135 - 145 mmol/L    Potassium 3.4 (L) 3.5 - 5.1 mmol/L    Chloride 107 98 - 111 mmol/L    CO2 19 (L) 22 - 32 mmol/L    Glucose, Bld 119 (H) 70 - 99 mg/dL      Comment: Glucose reference range applies only to samples taken after fasting for at least 8 hours.    BUN 18 6 - 20 mg/dL    Creatinine, Ser 6.04 (H) 0.61 - 1.24 mg/dL    Calcium 8.0 (L) 8.9 - 10.3 mg/dL    GFR, Estimated 26 (L) >60 mL/min      Comment: (NOTE) Calculated using the CKD-EPI Creatinine Equation (2021)      Anion gap 9 5 - 15      Comment: Performed at [facility], [address], [place], [place] [address]  Glucose, capillary     Status: Abnormal    Collection Time: 09/27/23  9:04 AM  Result Value Ref Range    Glucose-Capillary 205 (H) 70 - 99  mg/dL      Comment: Glucose reference range applies only to samples taken after fasting for at least 8 hours.    Comment 1 Notify RN    Glucose, capillary     Status: Abnormal    Collection Time: 09/27/23 12:14 PM  Result Value Ref Range    Glucose-Capillary 108 (H) 70 - 99 mg/dL      Comment: Glucose reference range applies only to samples taken after fasting for at least 8 hours.  Glucose, capillary     Status: Abnormal    Collection Time: 09/27/23  3:40 PM  Result Value Ref Range    Glucose-Capillary 146 (H) 70 - 99 mg/dL      Comment: Glucose reference range applies only to samples taken after fasting for at least 8 hours.    Comment 1 Notify RN    Glucose, capillary     Status: Abnormal    Collection Time: 09/27/23  7:57 PM  Result Value Ref Range    Glucose-Capillary 218 (H) 70 - 99 mg/dL      Comment: Glucose reference range applies only to samples taken after fasting for at least 8 hours.    Comment 1 Notify RN      Comment 2 Document in Chart    Glucose, capillary     Status: Abnormal    Collection Time: 09/28/23 12:35 AM  Result Value Ref Range    Glucose-Capillary 131 (H) 70 - 99 mg/dL      Comment: Glucose reference range applies only to samples taken after fasting for at least 8 hours.  Glucose, capillary     Status: Abnormal    Collection Time: 09/28/23  4:42 AM  Result Value Ref Range    Glucose-Capillary 129 (H) 70 - 99 mg/dL      Comment: Glucose reference range applies only to samples taken after fasting for at least 8 hours.  Magnesium     Status: None    Collection Time: 09/28/23  8:05 AM  Result Value Ref Range    Magnesium 1.9 1.7 -  2.4 mg/dL      Comment: Performed at [facility], [address] [place], [place] [address]  Phosphorus     Status: None    Collection Time: 09/28/23  8:05 AM  Result Value Ref Range    Phosphorus 3.8 2.5 - 4.6 mg/dL      Comment: Performed at [facility], [address], [place], [place] [address]  Basic  metabolic panel     Status: Abnormal    Collection Time: 09/28/23  8:05 AM  Result Value Ref Range    Sodium 134 (L) 135 - 145 mmol/L    Potassium 3.4 (L) 3.5 - 5.1 mmol/L    Chloride 105 98 - 111 mmol/L    CO2 18 (L) 22 - 32 mmol/L    Glucose, Bld 119 (H) 70 - 99 mg/dL      Comment: Glucose reference range applies only to samples taken after fasting for at least 8 hours.    BUN 15 6 - 20 mg/dL    Creatinine, Ser 8.29 (H) 0.61 - 1.24 mg/dL    Calcium 8.0 (L) 8.9 - 10.3 mg/dL    GFR, Estimated 28 (L) >60 mL/min      Comment: (NOTE) Calculated using the CKD-EPI Creatinine Equation (2021)      Anion gap 11 5 - 15      Comment: Performed at [facility], [address] [place], [place] [address]  CBC     Status: Abnormal    Collection Time: 09/28/23  8:05 AM  Result Value Ref Range    WBC 7.1 4.0 - 10.5 K/uL    RBC 4.18 (L) 4.22 - 5.81 MIL/uL    Hemoglobin 12.7 (L) 13.0 - 17.0 g/dL    HCT 08.6 57.8 - 46.9 %    MCV 94.0 80.0 - 100.0 fL    MCH 30.4 26.0 - 34.0 pg    MCHC 32.3 30.0 - 36.0 g/dL    RDW 62.9 (H) 52.8 - 15.5 %    Platelets 273 150 - 400 K/uL    nRBC 0.0 0.0 - 0.2 %      Comment: Performed at [facility], [address], [place], [place] [address]  Glucose, capillary     Status: Abnormal    Collection Time: 09/28/23  8:35 AM  Result Value Ref Range    Glucose-Capillary 106 (H) 70 - 99 mg/dL      Comment: Glucose reference range applies only to samples taken after fasting for at least 8 hours.    Comment 1 Notify RN      Comment 2 Document in Chart        Imaging Results (Last 48 hours)  No results found.         Blood pressure 135/64, pulse 70, temperature 98.7 F (37.1 C), temperature source Oral, resp. rate 18, weight 87.6 kg, SpO2 95%.   Medical Problem List and Plan: 1. Functional deficits secondary to watershed stroke with subcortical infarction in the left occipital lobe and posterior left frontal lobe              -patient may shower              -ELOS/Goals: 18-21 days, min assist PT, min assist OT, supervision SLP goals   -Dense R hemiparesis - would order WHO on rehab admission; PRAFO not needed d/t BKA   2.  Antithrombotics: -DVT/anticoagulation:  Pharmaceutical: Lovenox             -  antiplatelet therapy: Aspirin and Plavix for three months followed by aspirin alone (started 9/26)   3. Pain Management: Tylenol as needed   4. Mood/Behavior/Sleep: LCSW to evaluate and provide emotional support             -antipsychotic agents: n/a   5. Neuropsych/cognition: This patient is not quite capable of making decisions on his own behalf.  Would monitor closely over the next few days, as improved communication strategies may demonstrate he is approaching decision making capacity.   6. Skin/Wound Care: Routine skin care checks  -Discussed with nursing good skin check of bilateral residual limbs once in bed at rehab   7. Fluids/Electrolytes/Nutrition: strict Is and Os and follow-up chemistries   8: Hypertension: monitor TID and prn             -continue hydralazine 50 mg BID             -continue carvedilol 6.25 mg BID             -continue terazosin 2 mg q HS   9: Hyperlipidemia: continue statin   10: Chronic immunosuppressive therapy due to kidney/pancreas transplant 2014             -continue Gengraf, prednisone, Bactrim (Myfortic d/c by nephrology)   11: PAD s/p bilateral BKA             -on aspirin and statin  -Ambulates with bilateral prosthesis at home   12: AKI/CKD stage IIIa, s/p transplant: baseline creatinine ~1.6-1.9             -follow-up BMP              13: GERD: continue PPI BID   14: DM s/p pancreas transplant: CBGs QID; A1c = 5.8% (no home meds listed)             -continue SSI             -consider oral agent             -start carb modified diet   15: Nausea: resolved -discontinue Reglan -continue PPI -Zofran prn   15: UTI: E. coli isolated: continue ceftriaxone 2 grams q 24 hours (day #4)              -last dose tonight 10/04   16: Urinary retention: purewick in place             -continue PVRs, in and out cath PRN -has had low retention less than 200 today             -continue terazosin 4 mg daily             17: Hypokalemia: repleted; follow-up BMP        Milinda Antis, PA-C 09/28/2023  I have examined the patient independently and edited the note for HPI, ROS, exam, assessment, and plan as appropriate. I am in agreement with the above recommendations.   Angelina Sheriff, DO 09/29/2023

## 2023-09-29 NOTE — Progress Notes (Signed)
Inpatient Rehabilitation Admission Medication Review by a Pharmacist  A complete drug regimen review was completed for this patient to identify any potential clinically significant medication issues.  High Risk Drug Classes Is patient taking? Indication by Medication  Antipsychotic {Receiving?:26196}   Anticoagulant {Receiving?:26196} Lovenox: VTE ppx  Antibiotic Yes, as an intravenous medication Rocephin x 1 more dose Bactrim  Opioid {Receiving?:26196}   Antiplatelet {Receiving?:26196} Aspirin, Plavix  Hypoglycemics/insulin {Yes or No?:26198}   Vasoactive Medication {Receiving?:26196} Coreg, hydralazine, terazosin  Chemotherapy {Receiving Chemo?:26197}   Other {Yes or No?:26198} Cyclosporine Reglan: gut motility Protonix Prednisone Crestor: hyperlipidemia Tylenol: pain Robaxin Melatonin: sleep Benadryl: itching Senokot, Miralax, sorbitol Zofran: Nausea/vomiting Robitussin: cough Lidocaine jelly: In/Out cath     Type of Medication Issue Identified Description of Issue Recommendation(s)  Drug Interaction(s) (clinically significant)     Duplicate Therapy     Allergy     No Medication Administration End Date     Incorrect Dose     Additional Drug Therapy Needed     Significant med changes from prior encounter (inform family/care partners about these prior to discharge).  Restart or discontinue as appropriate. Communicate medication changes with patient/family at discharge  Other       Clinically significant medication issues were identified that warrant physician communication and completion of prescribed/recommended actions by midnight of the next day:  {Yes or No?:26198}  Name of provider notified for urgent issues identified: ***   Provider Method of Notification: ***    Pharmacist comments: ***   Time spent performing this drug regimen review (minutes): 30   Thank you for allowing pharmacy to be a part of this patient's care.   Signe Colt,  PharmD 09/29/2023 5:47 PM    **Pharmacist phone directory can be found on [email or web address] listed under [facility]**

## 2023-09-30 DIAGNOSIS — N390 Urinary tract infection, site not specified: Secondary | ICD-10-CM

## 2023-09-30 DIAGNOSIS — I639 Cerebral infarction, unspecified: Secondary | ICD-10-CM | POA: Diagnosis not present

## 2023-09-30 DIAGNOSIS — R339 Retention of urine, unspecified: Secondary | ICD-10-CM | POA: Diagnosis not present

## 2023-09-30 DIAGNOSIS — I10 Essential (primary) hypertension: Secondary | ICD-10-CM | POA: Diagnosis not present

## 2023-09-30 DIAGNOSIS — A499 Bacterial infection, unspecified: Secondary | ICD-10-CM

## 2023-09-30 LAB — CBC WITH DIFFERENTIAL/PLATELET
Abs Immature Granulocytes: 0.1 10*3/uL — ABNORMAL HIGH (ref 0.00–0.07)
Basophils Absolute: 0.1 10*3/uL (ref 0.0–0.1)
Basophils Relative: 1 %
Eosinophils Absolute: 0.2 10*3/uL (ref 0.0–0.5)
Eosinophils Relative: 2 %
HCT: 41.8 % (ref 39.0–52.0)
Hemoglobin: 13.4 g/dL (ref 13.0–17.0)
Immature Granulocytes: 1 %
Lymphocytes Relative: 15 %
Lymphs Abs: 1.2 10*3/uL (ref 0.7–4.0)
MCH: 31.1 pg (ref 26.0–34.0)
MCHC: 32.1 g/dL (ref 30.0–36.0)
MCV: 97 fL (ref 80.0–100.0)
Monocytes Absolute: 1 10*3/uL (ref 0.1–1.0)
Monocytes Relative: 13 %
Neutro Abs: 5.6 10*3/uL (ref 1.7–7.7)
Neutrophils Relative %: 68 %
Platelets: 377 10*3/uL (ref 150–400)
RBC: 4.31 MIL/uL (ref 4.22–5.81)
RDW: 16.2 % — ABNORMAL HIGH (ref 11.5–15.5)
WBC: 8.2 10*3/uL (ref 4.0–10.5)
nRBC: 0 % (ref 0.0–0.2)

## 2023-09-30 LAB — COMPREHENSIVE METABOLIC PANEL
ALT: 25 U/L (ref 0–44)
AST: 20 U/L (ref 15–41)
Albumin: 2.1 g/dL — ABNORMAL LOW (ref 3.5–5.0)
Alkaline Phosphatase: 67 U/L (ref 38–126)
Anion gap: 9 (ref 5–15)
BUN: 14 mg/dL (ref 6–20)
CO2: 21 mmol/L — ABNORMAL LOW (ref 22–32)
Calcium: 8.3 mg/dL — ABNORMAL LOW (ref 8.9–10.3)
Chloride: 106 mmol/L (ref 98–111)
Creatinine, Ser: 2.4 mg/dL — ABNORMAL HIGH (ref 0.61–1.24)
GFR, Estimated: 32 mL/min — ABNORMAL LOW (ref >60)
Glucose, Bld: 133 mg/dL — ABNORMAL HIGH (ref 70–99)
Potassium: 3.6 mmol/L (ref 3.5–5.1)
Sodium: 136 mmol/L (ref 135–145)
Total Bilirubin: 0.3 mg/dL (ref 0.3–1.2)
Total Protein: 6.2 g/dL — ABNORMAL LOW (ref 6.5–8.1)

## 2023-09-30 LAB — GLUCOSE, CAPILLARY
Glucose-Capillary: 152 mg/dL — ABNORMAL HIGH (ref 70–99)
Glucose-Capillary: 177 mg/dL — ABNORMAL HIGH (ref 70–99)
Glucose-Capillary: 155 mg/dL — ABNORMAL HIGH (ref 70–99)
Glucose-Capillary: 151 mg/dL — ABNORMAL HIGH (ref 70–99)

## 2023-09-30 MED ORDER — LIDOCAINE HCL URETHRAL/MUCOSAL 2 % EX GEL: 1.0000 | CUTANEOUS | Status: DC | PRN

## 2023-09-30 NOTE — Progress Notes (Signed)
   09/29/23 2102 09/30/23 0230 09/30/23 0240  Output (mL)  Urine 325 mL 225 mL 225 mL  Unmeasured Output  Stool Occurrence  --  1  --   Urine Characteristics  Urinary Incontinence No No No  Urine Color Yellow/straw Yellow/straw Yellow/straw  Urine Appearance Clear Clear Clear  Urinary Interventions Post void residual Post void residual Post void residual  Post Void Residual 32 mL 775 mL 486 mL  Hygiene Peri care Peri care  --     09/30/23 0329 09/30/23 0623  Output (mL)  Urine 300 mL 325 mL  Unmeasured Output  Stool Occurrence  --   --   Urine Characteristics  Urinary Incontinence No No  Urine Color Yellow/straw Yellow/straw  Urine Appearance Clear Clear  Urinary Interventions Post void residual Post void residual  Post Void Residual 267 mL 302 mL  Hygiene Peri care Peri care   Patient requiring double void and extra time for voids. No in and out caths this shift

## 2023-09-30 NOTE — Evaluation (Signed)
Physical Therapy Assessment and Plan  Patient Details  Name: [name] MRN: [id number] Date of Birth: 05/07/1976  PT Diagnosis: Abnormality of gait, Difficulty walking, Hemiparesis dominant, Impaired sensation, and Muscle weakness Rehab Potential: Good ELOS: 3 weeks   Today's Date: 09/30/2023 PT Individual Time: 7425-9563 PT Individual Time Calculation (min): 73 min    Hospital Problem: Principal Problem:   Acute CVA (cerebrovascular accident) (HCC) Active Problems:   Ischemic cerebrovascular accident (CVA) due to global hypoperfusion with watershed infarction [facility])   Past Medical History:  Past Medical History:  Diagnosis Date   AMPUTATION, BELOW KNEE, 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   Diabetes mellitus without complication [facility])    no since pancreas transplant   Dialysis patient [name]) 04/18/2012   "[facility] [place] [place], [place]"   Gastroparesis    Gastropathy    GERD (gastroesophageal reflux disease)    Hypertension    MRSA infection    over 10 years ago per patient. in legs   Past Surgical History:  Past Surgical History:  Procedure Laterality Date   AV FISTULA PLACEMENT  08/2011   left upper arm   BELOW KNEE LEG AMPUTATION  "it's been awhile"   bilaterally   CATARACT EXTRACTION  ~ 2011   right   COMBINED KIDNEY-PANCREAS TRANSPLANT  2014   ESOPHAGOGASTRODUODENOSCOPY N/A 12/30/2016   Procedure: ESOPHAGOGASTRODUODENOSCOPY (EGD);  Surgeon: Jeani Hawking, MD;  Location: [facility] ENDOSCOPY;  Service: Endoscopy;  Laterality: N/A;   OLECRANON BURSECTOMY Right 06/23/2020   Procedure: RIGHT ELBOW EXCISION OLECRANON BURSITIS;  Surgeon: Nadara Mustard, MD;  Location: [facility];  Service: Orthopedics;  Laterality: Right;    Assessment & Plan Clinical Impression: Patient is a 47 year old  male with a past medical history of hypertension, type 2 diabetes, PAD status post 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 Patient was at his infusion center today getting his Belatacept infusion for chronic immunosuppressive therapy and during his infusion he became hypotensive and had full body burning sensation. Patient did receive Benadryl at the infusion center and patient was transported to the [facility] for further evaluation and management. On initial arrival to the emergency department patient was altered, and had blood pressure of 99/86. Patient was altered, and minimally responsive. Patient was given IM epinephrine with mild improvement in mental status. Patient was mumbling and nonsensical, and therefore with concern for not protecting his own airway, patient was emergently intubated. Patient subsequently had code stroke called. Patient was admitted to the ICU for further evaluation management of altered mental status. Treated for E. coli UTI/bacteremia. Continues to have right hemiparesis with significant right arm weakness and mild right leg weakness. He is speech is improving but remains nonfluent but able to speak sentences and follows commands well. 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 Extubated 9/26. Nausea treated with scheduled Reglan. AKI atop CKD II-IIIa. Nephrology consulted 9/30. Immunosuppressant drug regimen reviewed/adjusted. Good UOP and no significant PVRs.  Patient currently requires max with mobility secondary to muscle weakness, decreased cardiorespiratoy endurance, impaired timing and sequencing, abnormal tone, unbalanced muscle activation, motor apraxia, and decreased motor planning,  decreased midline orientation, decreased initiation and decreased problem solving,  , and decreased sitting  balance, decreased standing balance, decreased postural control, hemiplegia, and decreased balance strategies.  Prior to hospitalization, patient was independent  with mobility and lived with Spouse in a House home.  Home access is 4Stairs to enter.  Patient will benefit from skilled PT intervention to maximize safe functional mobility, minimize fall risk, and decrease caregiver burden for planned discharge home with 24 hour assist.  Anticipate patient will benefit from follow up OP at discharge.  PT - End of Session Activity Tolerance: Tolerates 30+ min activity without fatigue Endurance Deficit: Yes Endurance Deficit Description: rest breaks within BADL tasks PT Assessment Rehab Potential (ACUTE/IP ONLY): Good PT Barriers to Discharge: Inaccessible home environment PT Barriers to Discharge Comments: 5 STE, full bed/bath on 2nd floor PT Patient demonstrates impairments in the following area(s): Balance;Endurance;Motor;Perception;Safety;Sensory PT Transfers Functional Problem(s): Bed Mobility;Bed to Chair;Car;Furniture PT Locomotion Functional Problem(s): Ambulation;Wheelchair Mobility;Stairs PT Plan PT Intensity: Minimum of 1-2 x/day ,45 to 90 minutes PT Frequency: 5 out of 7 days PT Duration Estimated Length of Stay: 3 weeks PT Treatment/Interventions: Ambulation/gait training;Functional electrical stimulation;Splinting/orthotics;Wheelchair propulsion/positioning;Balance/vestibular training;Functional mobility training;Stair training;Cognitive remediation/compensation;Neuromuscular re-education;Therapeutic Activities;Community reintegration;Pain management;Therapeutic Exercise;Discharge planning;Patient/family education;UE/LE Strength taining/ROM;Psychosocial support;UE/LE Coordination activities;Disease management/prevention;DME/adaptive equipment instruction;Visual/perceptual remediation/compensation PT Transfers Anticipated Outcome(s): CGA PT Locomotion Anticipated Outcome(s): min assist  ambulatory PT Recommendation Recommendations for Other Services: None Follow Up Recommendations: Outpatient PT Patient destination: Home Equipment Recommended: To be determined   PT Evaluation Precautions/Restrictions Precautions Precautions: Fall;Other (comment) Precaution Comments: Bil BKA (prosthesis in room), R hemi, avoid hypotension Restrictions Weight Bearing Restrictions: No Pain Interference Pain Interference Pain Effect on Sleep: 1. Rarely or not at all Pain Interference with Therapy Activities: 1. Rarely or not at all Pain Interference with Day-to-Day Activities: 1. Rarely or not at all Home Living/Prior Functioning Home Living Available Help at Discharge: Family Type of Home: House Home Access: Stairs to enter [employer] of Steps: 4 Entrance Stairs-Rails: Right;Left;Can reach both Home Layout: Two level;Able to live on main level with bedroom/bathroom Alternate Level Stairs-Number of Steps: 12 Alternate Level Stairs-Rails: Right Bathroom Shower/Tub: Engineer, manufacturing systems: Standard Bathroom Accessibility: Yes Additional Comments: pt wife present during session to confirm  Lives With: Spouse Prior Function Level of Independence: Independent with basic ADLs;Needs assistance with homemaking  Able to Take Stairs?: Yes Driving: Yes 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 - History Ability to See in Adequate Light: 0 Adequate Perception Perception: Impaired Preception Impairment Details: Inattention/Neglect;Body Part identification;Body Scheme Praxis Praxis: Impaired Praxis Impairment Details: Initiation;Ideation;Ideomotor;Perseveration;Motor planning;Organization  Cognition Overall Cognitive Status: Impaired/Different from baseline Arousal/Alertness: Awake/alert Orientation Level: Oriented X4 Attention: Selective Selective Attention: Appears intact Memory: Appears intact Awareness:  Impaired Awareness Impairment: Emergent impairment Problem Solving: Impaired Problem Solving Impairment: Functional basic Safety/Judgment: Impaired Sensation Sensation Light Touch: Appears Intact Coordination Gross Motor Movements are Fluid and Coordinated: No Fine Motor Movements are Fluid and Coordinated: No Coordination and Movement Description: R hemiplegia Motor  Motor Motor: Hemiplegia Motor - Skilled Clinical Observations: Dense R hemi  Trunk/Postural Assessment  Cervical Assessment Cervical Assessment: Within Functional Limits Thoracic Assessment Thoracic Assessment: Within Functional Limits Lumbar Assessment Lumbar Assessment: Within Functional Limits Postural Control Postural Control: Deficits on evaluation Righting Reactions: decreased Protective Responses: decreased Postural Limitations: decreased  Balance Standardized Balance Assessment Standardized Balance Assessment: PASS Postural Assessment Scale for Stroke Patients=PASS 1. Sitting Without Support: Can sit for 5 minutes without support 2. Standing With  Support: Can stand with moderate support of 1 person 3. Standing Without Support: Cannot stand without support 4.Standing on Nonparetic Leg: Cannot stand on nonparetic leg 5.Standing on Paretic Leg: Cannot stand on paretic leg MAINTAINING POSTURE SUBTOTAL: 5 6. Supine to Paretic Side Lateral: Can perform without help 7. Supine to Nonparetic Side Lateral: Can perform with little help 8. Supine to Sitting Up on the Edge of the Mat: Can perform with much help 9. Sitting on the Edge of the Mat to Supine: Can perform with much help 10. Sitting to Standing Up: Can perform with much help 11. Standing Up to Sitting Down: Can perform with little help 12. Standing,Picking Up a Pencil from the Floor: Cannot perform CHANGING POSTURE SUBTOTAL: 10 PASS TOTAL SCORE: 15 Static Sitting Balance Static Sitting - Balance Support: No upper extremity supported Static Sitting  - Level of Assistance: 5: Stand by assistance Dynamic Sitting Balance Dynamic Sitting - Balance Support: During functional activity Dynamic Sitting - Level of Assistance: 4: Min assist Static Standing Balance Static Standing - Level of Assistance: 3: Mod assist Dynamic Standing Balance Dynamic Standing - Level of Assistance: 1: +2 Total assist Extremity Assessment  RUE Assessment RUE Assessment: Exceptions to [facility] RUE Body System: Neuro Brunstrum levels for arm and hand: Arm;Hand Brunstrum level for arm: Stage I Presynergy Brunstrum level for hand: Stage II Synergy is developing LUE Assessment LUE Assessment: Within Functional Limits RLE Assessment RLE Assessment: Exceptions to [facility] RLE Strength Right Hip Flexion: 2-/5 Right Knee Flexion: 1/5 Right Knee Extension: 2-/5 RLE Tone RLE Tone: Mild LLE Assessment LLE Assessment: Within Functional Limits General Strength Comments: grossly 4/5 (some motor apraxia noted)  Care Tool Care Tool Bed Mobility Roll left and right activity   Roll left and right assist level: Moderate Assistance - Patient 50 - 74%    Sit to lying activity   Sit to lying assist level: Maximal Assistance - Patient 25 - 49%    Lying to sitting on side of bed activity   Lying to sitting on side of bed assist level: the ability to move from lying on the back to sitting on the side of the bed with no back support.: Maximal Assistance - Patient 25 - 49%     Care Tool Transfers Sit to stand transfer Sit to stand activity did not occur: Safety/medical concerns Sit to stand assist level: 2 Helpers    Chair/bed transfer   Chair/bed transfer assist level: Maximal Assistance - Patient 25 - 49%     Toilet transfer   Assist Level: Maximal Assistance - Patient 24 - 49%    Car transfer Car transfer activity did not occur: Safety/medical concerns        Care Tool Locomotion Ambulation Ambulation activity did not occur: Safety/medical concerns        Walk 10  feet activity Walk 10 feet activity did not occur: Safety/medical concerns       Walk 50 feet with 2 turns activity Walk 50 feet with 2 turns activity did not occur: Safety/medical concerns      Walk 150 feet activity Walk 150 feet activity did not occur: Safety/medical concerns      Walk 10 feet on uneven surfaces activity Walk 10 feet on uneven surfaces activity did not occur: Safety/medical concerns      Stairs Stair activity did not occur: Safety/medical concerns        Walk up/down 1 step activity Walk up/down 1 step or curb (drop down) activity did not occur:  Safety/medical concerns      Walk up/down 4 steps activity Walk up/down 4 steps activity did not occur: Safety/medical concerns      Walk up/down 12 steps activity Walk up/down 12 steps activity did not occur: Safety/medical concerns      Pick up small objects from floor Pick up small object from the floor (from standing position) activity did not occur: Safety/medical concerns      Wheelchair Is the patient using a wheelchair?: Yes Type of Wheelchair: Manual   Wheelchair assist level: Moderate Assistance - Patient 50 - 74% Max wheelchair distance: 150  Wheel 50 feet with 2 turns activity   Assist Level: Moderate Assistance - Patient 50 - 74%  Wheel 150 feet activity   Assist Level: Moderate Assistance - Patient 50 - 74%    Refer to Care Plan for Long Term Goals  SHORT TERM GOAL WEEK 1 PT Short Term Goal 1 (Week 1): Pt will complete transfer min assist x1 PT Short Term Goal 2 (Week 1): Pt will complete bed mobility with min assist PT Short Term Goal 3 (Week 1): Pt will complete WC mobility with supervision 150' PT Short Term Goal 4 (Week 1): Pt will initiate gait training  Recommendations for other services: None   Skilled Therapeutic Intervention Evaluation completed (see details above and below) with education on PT POC and goals and individual treatment initiated with focus on transfer training, NMR  for dynamic sitting/standing balance and RLE muscle fiber recruitment needed for functional transfers and gait. Pt denies pain at this time. Pt completes WC mobility >150' from room to main gym with mod progress to min assist with L hemi technique, cues for attention to R side. Pt completes squat pivot transfers to L with max assist, demonstrates poor head/hips relationship and timing associate with squat pivot transfer, possibly due to motor apraxia. Pt completes sit<>stands with mod assist x2 initially with bilateral HHA, requires mod assist x2 for standing balance progress to CGA x1 with increased time and mirror for visual cues. Pt completes multiple sit<>stands to RW with mod assist x1 for RUE hand placement, anterior wt shift and gluteal clearance, verbal/visual cues for orienting to midline in standing. Pt completes mini squats in standing with BUE support on RW, total of 7 reps with min/mod assist for standing balance, verbal/visual cues to correct R lateral lean to midline with pt able to correct with time with each repetition. Pt positioned in //bars and completes sit<>stand with light mod assist LUE support on bars, completes 2x5 RLE marches with active assist to clear R foot, demonstrates initiation for task however with increased extensor tone noted functionally. Pt completes PASS 15/36 indicating good prognosis for return to functional ambulation. Pt returned to room and completes squat pivot with max assist WC to bed, pt able to doff prostheses in sitting with CGA for sitting balance, requires max assist for sit to supine with pt able to doff interface sleeve with min assist for RLE. Pt remains supine with all needs within reach, call light in place, and pt wife at bedside at end of session. Bed alarm activated.  Mobility Bed Mobility Bed Mobility: Rolling Right;Rolling Left;Supine to Sit;Sit to Supine Rolling Right: Minimal Assistance - Patient > 75% Rolling Left: Maximal Assistance - Patient  25-49% Supine to Sit: Maximal Assistance - Patient - Patient 25-49% Sit to Supine: Maximal Assistance - Patient 25-49% Transfers Transfers: Scientist, clinical (histocompatibility and immunogenetics) Transfers;Sit to Stand;Stand to Sit Sit to Stand: 2 Helpers Stand to Sit: 2  Helpers Squat Pivot Transfers: Maximal Assistance - Patient 25-49% Locomotion  Gait Ambulation: No Gait Gait: No Stairs / Additional Locomotion Stairs: No Wheelchair Mobility Wheelchair Mobility: Yes Wheelchair Assistance: Moderate Assistance - Patient 50 - 74% Wheelchair Propulsion: Left upper extremity;Left lower extremity Wheelchair Parts Management: Needs assistance Distance: >107'   Discharge Criteria: Patient will be discharged from PT if patient refuses treatment 3 consecutive times without medical reason, if treatment goals not met, if there is a change in medical status, if patient makes no progress towards goals or if patient is discharged from hospital.  The above assessment, treatment plan, treatment alternatives and goals were discussed and mutually agreed upon: by patient and by family  Edwin Cap PT, DPT 09/30/2023, 2:23 PM

## 2023-09-30 NOTE — Plan of Care (Signed)
  Problem: RH Balance Goal: LTG Patient will maintain dynamic sitting balance (PT) Description: LTG:  Patient will maintain dynamic sitting balance with assistance during mobility activities (PT) Flowsheets (Taken 09/30/2023 1519) LTG: Pt will maintain dynamic sitting balance during mobility activities with:: Independent Goal: LTG Patient will maintain dynamic standing balance (PT) Description: LTG:  Patient will maintain dynamic standing balance with assistance during mobility activities (PT) Flowsheets (Taken 09/30/2023 1519) LTG: Pt will maintain dynamic standing balance during mobility activities with:: Contact Guard/Touching assist   Problem: Sit to Stand Goal: LTG:  Patient will perform sit to stand with assistance level (PT) Description: LTG:  Patient will perform sit to stand with assistance level (PT) Flowsheets (Taken 09/30/2023 1519) LTG: PT will perform sit to stand in preparation for functional mobility with assistance level: Contact Guard/Touching assist   Problem: RH Bed Mobility Goal: LTG Patient will perform bed mobility with assist (PT) Description: LTG: Patient will perform bed mobility with assistance, with/without cues (PT). Flowsheets (Taken 09/30/2023 1519) LTG: Pt will perform bed mobility with assistance level of: Supervision/Verbal cueing   Problem: RH Bed to Chair Transfers Goal: LTG Patient will perform bed/chair transfers w/assist (PT) Description: LTG: Patient will perform bed to chair transfers with assistance (PT). Flowsheets (Taken 09/30/2023 1519) LTG: Pt will perform Bed to Chair Transfers with assistance level: Contact Guard/Touching assist   Problem: RH Car Transfers Goal: LTG Patient will perform car transfers with assist (PT) Description: LTG: Patient will perform car transfers with assistance (PT). Flowsheets (Taken 09/30/2023 1519) LTG: Pt will perform car transfers with assist:: Contact Guard/Touching assist   Problem: RH Ambulation Goal: LTG  Patient will ambulate in controlled environment (PT) Description: LTG: Patient will ambulate in a controlled environment, # of feet with assistance (PT). Flowsheets (Taken 09/30/2023 1519) LTG: Pt will ambulate in controlled environ  assist needed:: Minimal Assistance - Patient > 75% LTG: Ambulation distance in controlled environment: 150' Note: With LRAD Goal: LTG Patient will ambulate in home environment (PT) Description: LTG: Patient will ambulate in home environment, # of feet with assistance (PT). Flowsheets (Taken 09/30/2023 1519) LTG: Pt will ambulate in home environ  assist needed:: Minimal Assistance - Patient > 75% LTG: Ambulation distance in home environment: 50' Note: With LRAD   Problem: RH Wheelchair Mobility Goal: LTG Patient will propel w/c in controlled environment (PT) Description: LTG: Patient will propel wheelchair in controlled environment, # of feet with assist (PT) Flowsheets (Taken 09/30/2023 1519) LTG: Pt will propel w/c in controlled environ  assist needed:: Independent with assistive device LTG: Propel w/c distance in controlled environment: 150'   Problem: RH Stairs Goal: LTG Patient will ambulate up and down stairs w/assist (PT) Description: LTG: Patient will ambulate up and down # of stairs with assistance (PT) Flowsheets (Taken 09/30/2023 1531) LTG: Pt will ambulate up/down stairs assist needed:: Minimal Assistance - Patient > 75% LTG: Pt will  ambulate up and down number of stairs: 5 Note: To simulate home environment

## 2023-09-30 NOTE — Evaluation (Signed)
Speech Language Pathology Assessment and Plan  Patient Details  Name: [name] MRN: [id number] Date of Birth: 1976-08-15  SLP Diagnosis: Cognitive Impairments;Aphasia;Apraxia  Rehab Potential: Good ELOS: 3 weeks    Today's Date: 09/30/2023 SLP Individual Time: 2440-1027 SLP Individual Time Calculation (min): 60 min   Hospital Problem: Principal Problem:   Acute CVA (cerebrovascular accident) [facility]) Active Problems:   Ischemic cerebrovascular accident (CVA) due to global hypoperfusion with watershed infarction [facility])  Past Medical History:  Past Medical History:  Diagnosis Date   AMPUTATION, BELOW KNEE, 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   Diabetes mellitus without complication [facility])    no since pancreas transplant   Dialysis patient [facility]) 04/18/2012   "[facility]; [place], [place]"   Gastroparesis    Gastropathy    GERD (gastroesophageal reflux disease)    Hypertension    MRSA infection    over 10 years ago per patient. in legs   Past Surgical History:  Past Surgical History:  Procedure Laterality Date   AV FISTULA PLACEMENT  08/2011   left upper arm   BELOW KNEE LEG AMPUTATION  "it's been awhile"   bilaterally   CATARACT EXTRACTION  ~ 2011   right   COMBINED KIDNEY-PANCREAS TRANSPLANT  2014   ESOPHAGOGASTRODUODENOSCOPY N/A 12/30/2016   Procedure: ESOPHAGOGASTRODUODENOSCOPY (EGD);  Surgeon: Jeani Hawking, MD;  Location: [facility] ENDOSCOPY;  Service: Endoscopy;  Laterality: N/A;   OLECRANON BURSECTOMY Right 06/23/2020   Procedure: RIGHT ELBOW EXCISION OLECRANON BURSITIS;  Surgeon: Nadara Mustard, MD;  Location: [facility];  Service: Orthopedics;  Laterality: Right;    Assessment / Plan / Recommendation Clinical Impression Patient is a 47 yo male who presented to ED 9/23 with AMS.  Pt found to be aphasic with a facial droop and a code stroke was activated. Intubated 9/23-9/24. MRI Brain with scattered small foci of acute subcortical infarction in L occipital lobe and posterior L frontal lobe and occluded L ICA. Recently admitted 9/9-9/11 with findings of E coli UTI and poorly controlled HTN. 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 Therapy evaluations completed with recommendations for CIR. Patient admitted 09/29/23.  Patient demonstrates moderate impairments in functional communication impacting both auditory comprehension and verbal expression. Patient answered basic yes/no questions with 90% accuracy but accuracy decreased to 60% as complexity progressed. Patient also able to follow 1-step commands with 100% accuracy but unable to follow any 2-step commands with suspected motor planning deficits impacting function. Patient's overall verbal expression is non-fluent with phonemic paraphasias and neologisms noted. Patient could complete automatic speech tasks but unable to complete responsive naming tasks and required Max verbal cues for confrontational naming tasks. Patient's overall reading comprehension appeared intact at the word and phrase level with patient able to utilize written cues to verbalize orientation information when provided choices from a field of 2 and to assist in expressing basic wants/needs. SLP created a very simple communication board with 6 written words to express basic wants/needs that the patient was able to utilize with extra time. Overall, patient's word-finding improved with semantic cues, sentence completion cues, and written cues.    A formal cognitive evaluation was not administered but patient demonstrated mild  deficits in emergent awareness and functional problem solving.  Patient would benefit from skilled SLP intervention to maximize his functional communication and  cognitive functioning prior to discharge in order to reduce caregiver burden. Anticipate patient will need 24 hour supervision and f/u SLP services at discharge.     Skilled Therapeutic Interventions          Administered a cognitive-linguistic evaluation, please see above for details.   SLP Assessment  Patient will need skilled Speech Lanaguage Pathology Services during CIR admission    Recommendations  Patient destination: Home Follow up Recommendations: Outpatient SLP Equipment Recommended: None recommended by SLP    SLP Frequency 3 to 5 out of 7 days   SLP Duration  SLP Intensity  SLP Treatment/Interventions 3 weeks  Minumum of 1-2 x/day, 30 to 90 minutes  Cognitive remediation/compensation;Cueing hierarchy;Environmental controls;Functional tasks;Internal/external aids;Multimodal communication approach;Patient/family education;Therapeutic Activities    Pain No/Denies Pain   SLP Evaluation Cognition Overall Cognitive Status: Impaired/Different from baseline Arousal/Alertness: Awake/alert Orientation Level: Oriented X4 (with use of written options) Attention: Selective Selective Attention: Appears intact Memory: Appears intact Awareness: Impaired Awareness Impairment: Emergent impairment Problem Solving: Impaired Problem Solving Impairment: Functional basic Safety/Judgment: Impaired  Comprehension Auditory Comprehension Overall Auditory Comprehension: Impaired Yes/No Questions: Impaired Basic Biographical Questions: 76-100% accurate Basic Immediate Environment Questions: 75-100% accurate Complex Questions: 50-74% accurate Commands: Impaired One Step Basic Commands: 75-100% accurate Two Step Basic Commands: 0-24% accurate Conversation: Simple Interfering Components: Motor planning EffectiveTechniques: Extra processing time;Repetition Visual Recognition/Discrimination Discrimination: Within Function Limits Reading Comprehension Reading Status: Impaired Word  level: Within functional limits Expression Expression Primary Mode of Expression: Verbal Verbal Expression Overall Verbal Expression: Impaired Initiation: Impaired Automatic Speech: Name;Social Response Level of Generative/Spontaneous Verbalization: Word;Phrase Repetition: Impaired Level of Impairment: Word level Naming: Impairment Responsive: 0-25% accurate Confrontation: Impaired Convergent: Not tested Divergent: Not tested Verbal Errors: Phonemic paraphasias;Neologisms;Perseveration Pragmatics: No impairment Effective Techniques: Semantic cues;Written cues;Sentence completion Written Expression Dominant Hand: Right Written Expression: Unable to assess (comment) Oral Motor Oral Motor/Sensory Function Overall Oral Motor/Sensory Function: Mild impairment Facial ROM: Reduced right Facial Symmetry: Abnormal symmetry right Facial Strength: Within Functional Limits Lingual ROM: Within Functional Limits Lingual Symmetry: Within Functional Limits Lingual Strength: Within Functional Limits Lingual Sensation: Within Functional Limits Motor Speech Overall Motor Speech: Impaired Respiration: Within functional limits Phonation: Normal Resonance: Within functional limits Articulation: Within functional limitis Intelligibility: Intelligible Motor Planning: Impaired Motor Speech Errors: Aware;Unaware  Care Tool Care Tool Cognition Ability to hear (with hearing aid or hearing appliances if normally used Ability to hear (with hearing aid or hearing appliances if normally used): 0. Adequate - no difficulty in normal conservation, social interaction, listening to TV   Expression of Ideas and Wants Expression of Ideas and Wants: 2. Frequent difficulty - frequently exhibits difficulty with expressing needs and ideas   Understanding Verbal and Non-Verbal Content Understanding Verbal and Non-Verbal Content: 2. Sometimes understands - understands only basic conversations or simple, direct  phrases. Frequently requires cues to understand  Memory/Recall Ability Memory/Recall Ability : That he or she is in a hospital/hospital unit;Current season    Short Term Goals: Week 1: SLP Short Term Goal 1 (Week 1): Patient will demonstrate functional problem solving for basic and familair tasks with Mod verbal and visual cues. SLP Short Term Goal 2 (Week 1): Patient will self-monitor and correct phonemic errors at the word level with Min verbal and visual cues. SLP Short Term Goal 3 (Week 1): Patient will follow 2-step commands with 50% accuracy and Mod verbal  cues. SLP Short Term Goal 4 (Week 1): Patient will utilize multimodal communication to express basic wants/needs with Mod A multimodal cues. SLP Short Term Goal 5 (Week 1): Patient will name functional items with 50% accuracy and Mod verbal cues.  Refer to Care Plan for Long Term Goals  Recommendations for other services: None   Discharge Criteria: Patient will be discharged from SLP if patient refuses treatment 3 consecutive times without medical reason, if treatment goals not met, if there is a change in medical status, if patient makes no progress towards goals or if patient is discharged from hospital.  The above assessment, treatment plan, treatment alternatives and goals were discussed and mutually agreed upon: by patient  Anthany Thornhill 09/30/2023, 12:08 PM

## 2023-09-30 NOTE — Plan of Care (Signed)
  Problem: RH Comprehension Communication Goal: LTG Patient will comprehend basic/complex auditory (SLP) Description: LTG: Patient will comprehend basic/complex auditory information with cues (SLP). Flowsheets (Taken 09/30/2023 1211) LTG: Patient will comprehend: Complex auditory information LTG: Patient will comprehend auditory information with cueing (SLP): Supervision   Problem: RH Expression Communication Goal: LTG Patient will express needs/wants via multi-modal(SLP) Description: LTG:  Patient will express needs/wants via multi-modal communication (gestures/written, etc) with cues (SLP) Flowsheets (Taken 09/30/2023 1211) LTG: Patient will express needs/wants via multimodal communication (gestures/written, etc) with cueing (SLP): Modified Independent Goal: LTG Patient will verbally express basic/complex needs(SLP) Description: LTG:  Patient will verbally express basic/complex needs, wants or ideas with cues  (SLP) Flowsheets (Taken 09/30/2023 1211) LTG: Patient will verbally express basic/complex needs, wants or ideas (SLP): Minimal Assistance - Patient > 75% Goal: LTG Patient will increase word finding of common (SLP) Description: LTG:  Patient will increase word finding of common objects/daily info/abstract thoughts with cues using compensatory strategies (SLP). Flowsheets (Taken 09/30/2023 1211) LTG: Patient will increase word finding of common (SLP): Minimal Assistance - Patient > 75%   Problem: RH Problem Solving Goal: LTG Patient will demonstrate problem solving for (SLP) Description: LTG:  Patient will demonstrate problem solving for basic/complex daily situations with cues  (SLP) Flowsheets (Taken 09/30/2023 1211) LTG: Patient will demonstrate problem solving for (SLP): Basic daily situations LTG Patient will demonstrate problem solving for: Supervision   Problem: RH Awareness Goal: LTG: Patient will demonstrate awareness during functional activites type of (SLP) Description:  LTG: Patient will demonstrate awareness during functional activites type of (SLP) Flowsheets (Taken 09/30/2023 1211) Patient will demonstrate during cognitive/linguistic activities awareness type of: Emergent LTG: Patient will demonstrate awareness during cognitive/linguistic activities with assistance of (SLP): Supervision

## 2023-09-30 NOTE — Evaluation (Signed)
Occupational Therapy Assessment and Plan  Patient Details  Name: [name] MRN: [id number] Date of Birth: 1976/11/05  OT Diagnosis: hemiplegia affecting dominant side and muscle weakness (generalized) Rehab Potential: Rehab Potential (ACUTE ONLY): Good ELOS: 3 weeks   Today's Date: 09/30/2023 OT Individual Time: 1045-1200 OT Individual Time Calculation (min): 75 min     Hospital Problem: Principal Problem:   Acute CVA (cerebrovascular accident) (HCC) Active Problems:   Ischemic cerebrovascular accident (CVA) due to global hypoperfusion with watershed infarction [facility])   Past Medical History:  Past Medical History:  Diagnosis Date   AMPUTATION, BELOW KNEE, 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   Diabetes mellitus without complication [facility])    no since pancreas transplant   Dialysis patient [facility]) 04/18/2012   "[facility]; [place], [place], [place]"   Gastroparesis    Gastropathy    GERD (gastroesophageal reflux disease)    Hypertension    MRSA infection    over 10 years ago per patient. in legs   Past Surgical History:  Past Surgical History:  Procedure Laterality Date   AV FISTULA PLACEMENT  08/2011   left upper arm   BELOW KNEE LEG AMPUTATION  "it's been awhile"   bilaterally   CATARACT EXTRACTION  ~ 2011   right   COMBINED KIDNEY-PANCREAS TRANSPLANT  2014   ESOPHAGOGASTRODUODENOSCOPY N/A 12/30/2016   Procedure: ESOPHAGOGASTRODUODENOSCOPY (EGD);  Surgeon: Jeani Hawking, MD;  Location: [facility] ENDOSCOPY;  Service: Endoscopy;  Laterality: N/A;   OLECRANON BURSECTOMY Right 06/23/2020   Procedure: RIGHT ELBOW EXCISION OLECRANON BURSITIS;  Surgeon: Nadara Mustard, MD;  Location: [facility];  Service: Orthopedics;  Laterality: Right;    Assessment & Plan Clinical Impression: Patient is a 47 y.o.  year old male with recent admission to the hospital on9/23/24 from infusion center where he was receiving his Belatacept infusion for chronic immunosuppressive therapy and during his infusion he became hypotensive and had full body burning sensation. Pt received IM epinephrine on ED arrival with improvement in symptoms but noted difficulty speaking and facial droop. Code stroke activated. Imaging revealed scattered small foci of acute subcortical infarction in the left occipital lobe and posterior left frontal lobe and occluded left ICA. PMH includes HTN, DMII, PAD s/p bilateral BKA, renal and pancreatic transplant, depression, GERD.Patient transferred to CIR on 09/29/2023 .    Patient currently requires min with basic self-care skills secondary to muscle weakness, decreased cardiorespiratoy endurance, impaired timing and sequencing, abnormal tone, unbalanced muscle activation, and decreased coordination, decreased motor planning, decreased attention, decreased awareness, decreased problem solving, and decreased safety awareness, and decreased sitting balance, decreased standing balance, decreased postural control, and hemiplegia.  Prior to hospitalization, patient could complete BADL with modified independent .  Patient will benefit from skilled intervention to increase independence with basic self-care skills prior to discharge home with care partner.  Anticipate patient will require 24 hour supervision and minimal physical assistance and follow up home health.  OT - End of Session Activity Tolerance: Tolerates < 10 min activity with changes in vital signs Endurance Deficit: Yes Endurance Deficit Description: rest breaks within BADL tasks OT Assessment Rehab Potential (ACUTE ONLY): Good OT Patient demonstrates impairments in the following area(s): Balance;Cognition;Endurance;Motor;Perception;Safety OT Basic ADL's Functional Problem(s): Eating;Bathing;Grooming;Dressing;Toileting OT Transfers  Functional  Problem(s): Toilet;Tub/Shower OT Additional Impairment(s): Fuctional Use of Upper Extremity OT Plan OT Intensity: Minimum of 1-2 x/day, 45 to 90 minutes OT Frequency: 5 out of 7 days OT Duration/Estimated Length of Stay: 3 weeks OT Treatment/Interventions: Cognitive remediation/compensation;Balance/vestibular training;Community reintegration;Discharge planning;Disease mangement/prevention;DME/adaptive equipment instruction;Functional electrical stimulation;Functional mobility training;Neuromuscular re-education;Pain management;Patient/family education;Psychosocial support;Self Care/advanced ADL retraining;Skin care/wound managment;Splinting/orthotics;Therapeutic Activities;Therapeutic Exercise;UE/LE Strength taining/ROM;UE/LE Coordination activities;Visual/perceptual remediation/compensation;Wheelchair propulsion/positioning OT Self Feeding Anticipated Outcome(s): Supervision OT Basic Self-Care Anticipated Outcome(s): Min A OT Toileting Anticipated Outcome(s): Min A OT Bathroom Transfers Anticipated Outcome(s): Min A OT Recommendation Recommendations for Other Services: Therapeutic Recreation consult Therapeutic Recreation Interventions: Outing/community reintergration;Stress management Patient destination: Home Follow Up Recommendations: Home health OT Equipment Recommended: To be determined   OT Evaluation Precautions/Restrictions  Precautions Precautions: Fall;Other (comment) Precaution Comments: Bil BKA (prosthesis in room), R hemi, avoid hypotension Restrictions Weight Bearing Restrictions: No Pain  Denies pain Home Living/Prior Functioning Home Living Family/patient expects to be discharged to:: Private residence Living Arrangements: Spouse/significant other Available Help at Discharge: Family Type of Home: House Home Access: Stairs to enter Secretary/administrator of Steps: 4 Entrance Stairs-Rails: Right Home Layout: Two level, Able to live on main level with  bedroom/bathroom Bathroom Shower/Tub: Engineer, manufacturing systems: Standard Bathroom Accessibility: Yes Additional Comments: information taken from chart review  Lives With: Spouse IADL History IADL Comments: Pt reports he drives, need to clarify Prior Function Level of Independence: Independent with basic ADLs, Needs assistance with homemaking Vision Baseline Vision/History: 1 Wears glasses Ability to See in Adequate Light: 0 Adequate Patient Visual Report: No change from baseline Perception  Perception: Impaired Praxis Praxis: Impaired Praxis Impairment Details: Initiation;Ideation;Ideomotor;Perseveration;Motor planning;Organization Cognition Cognition Overall Cognitive Status: Impaired/Different from baseline Arousal/Alertness: Awake/alert Orientation Level: Person;Place;Situation Person: Oriented (oriented with choice of 3) Place: Oriented (oriented with choice of 3) Situation: Disoriented Memory: Appears intact Attention: Selective Selective Attention: Appears intact Awareness: Impaired Awareness Impairment: Emergent impairment Problem Solving: Impaired Problem Solving Impairment: Functional basic Safety/Judgment: Impaired Brief Interview for Mental Status (BIMS) Repetition of Three Words (First Attempt): 3 (Pt able to state the three words, but aphasic and difficulty answering questions) Temporal Orientation: Year: Nonsensical Temporal Orientation: Month: Missed by more than 1 month Temporal Orientation: Day: No answer Recall: "Sock": No answer Recall: "Blue": No answer Recall: "Bed": No answer BIMS Summary Score: 99 Sensation Sensation Light Touch: Appears Intact (per pt report) Coordination Gross Motor Movements are Fluid and Coordinated: No Fine Motor Movements are Fluid and Coordinated: No Coordination and Movement Description: R hemiplegia Motor  Motor Motor: Hemiplegia Motor - Skilled Clinical Observations: Dense R hemi  Trunk/Postural Assessment      Balance Static Sitting Balance Static Sitting - Level of Assistance: 4: Min assist Dynamic Sitting Balance Dynamic Sitting - Balance Support: During functional activity Dynamic Sitting - Level of Assistance: 4: Min assist;3: Mod assist Extremity/Trunk Assessment RUE Assessment RUE Assessment: Exceptions to [facility] RUE Body System: Neuro Brunstrum levels for arm and hand: Arm;Hand Brunstrum level for arm: Stage I Presynergy Brunstrum level for hand: Stage II Synergy is developing LUE Assessment LUE Assessment: Within Functional Limits  Care Tool Care Tool Self Care Eating   Eating Assist Level: Minimal Assistance - Patient > 75%    Oral Care    Oral Care Assist Level: Minimal Assistance - Patient > 75%    Bathing   Body parts bathed by patient: Chest;Abdomen;Front perineal area;Face Body parts bathed by helper: Buttocks;Left upper leg;Right upper leg;Left arm;Right arm Body parts n/a: Left lower leg;Right lower leg  Assist Level: Maximal Assistance - Patient 24 - 49%    Upper Body Dressing(including orthotics)   What is the patient wearing?: Pull over shirt   Assist Level: Moderate Assistance - Patient 50 - 74%    Lower Body Dressing (excluding footwear)   What is the patient wearing?: Pants Assist for lower body dressing: Maximal Assistance - Patient 25 - 49%    Putting on/Taking off footwear   What is the patient wearing?: Orthosis Assist for footwear: Maximal Assistance - Patient 25 - 49%       Care Tool Toileting Toileting activity   Assist for toileting: Maximal Assistance - Patient 25 - 49%     Care Tool Bed Mobility Roll left and right activity   Roll left and right assist level: Maximal Assistance - Patient 25 - 49%    Sit to lying activity        Lying to sitting on side of bed activity   Lying to sitting on side of bed assist level: the ability to move from lying on the back to sitting on the side of the bed with no back support.: Maximal Assistance  - Patient 25 - 49%     Care Tool Transfers Sit to stand transfer Sit to stand activity did not occur: Safety/medical concerns      Chair/bed transfer   Chair/bed transfer assist level: Maximal Assistance - Patient 25 - 49%     Toilet transfer   Assist Level: Maximal Assistance - Patient 24 - 49%     Care Tool Cognition  Expression of Ideas and Wants Expression of Ideas and Wants: 2. Frequent difficulty - frequently exhibits difficulty with expressing needs and ideas  Understanding Verbal and Non-Verbal Content Understanding Verbal and Non-Verbal Content: 2. Sometimes understands - understands only basic conversations or simple, direct phrases. Frequently requires cues to understand   Memory/Recall Ability Memory/Recall Ability : That he or she is in a hospital/hospital unit;Current season   Refer to Care Plan for Long Term Goals  SHORT TERM GOAL WEEK 1 OT Short Term Goal 1 (Week 1): Patient will complete toilet transfer with mod A of 1 OT Short Term Goal 2 (Week 1): Patient will recall hemi-dressing techniques with min cues OT Short Term Goal 3 (Week 1): Patient will complete 1 step of LB dressing task  Recommendations for other services: None    Skilled Therapeutic Intervention OT eval completed addressing rehab process, OT purpose, POC, ELOS, and goals.  LB ADLs completed at bed level with max A, Max A to transition to EOB. Max A to don B prosthestics prior to squat-pivot transfer w/ mod A and diffiuclty motor planing. UB bathing/dressing from wc with mod A. Dense R hemiplegia with more distal activation in hand. Pt able to squeeze OT's fungers on R hand, but trace activation proximal to shoulder. See below for details regarding BADL performance.   ADL ADL Eating: Minimal assistance Grooming: Minimal assistance Upper Body Bathing: Moderate assistance Lower Body Bathing: Maximal assistance Upper Body Dressing: Moderate assistance Lower Body Dressing: Maximal  assistance Toileting: Maximal assistance Toilet Transfer: Maximal assistance Mobility  Bed Mobility Bed Mobility: Rolling Right;Rolling Left;Supine to Sit Rolling Right: Minimal Assistance - Patient > 75% Rolling Left: Maximal Assistance - Patient 25-49% Supine to Sit: Maximal Assistance - Patient - Patient 25-49%   Discharge Criteria: Patient will be discharged from OT if patient refuses treatment 3 consecutive times without medical reason, if treatment goals not met, if there is a change in medical  status, if patient makes no progress towards goals or if patient is discharged from hospital.  The above assessment, treatment plan, treatment alternatives and goals were discussed and mutually agreed upon: by patient  Mal Amabile 09/30/2023, 12:28 PM

## 2023-09-30 NOTE — Progress Notes (Signed)
[facility] Progress Note  Name: [name] MRN: [id number] DOB: February 09, 1976   Subjective:  He had 2.0 liters UOP charted over 10/4 as well as 1 unmeasured urine void.  He had elevated post-void bladder scans but didn't get in/out cath'd after coming to rehab. Creatinine is up a bit today.  He has aphasia but expressed that he is a little frustrated with the frequency of the post-void bladder scans - happening "every 2 hours"  Review of systems:      Denies nausea/v/d Denies shortness of breath or chest pain Hx limited by known aphasia    Intake/Output Summary (Last 24 hours) at 09/30/2023 1702 Last data filed at 09/30/2023 1632 Gross per 24 hour  Intake 357 ml  Output 3500 ml  Net -3143 ml    Vitals:  Vitals:   09/29/23 1854 09/29/23 2007 09/30/23 0343 09/30/23 1247  BP: (!) 165/67 (!) 176/70 (!) 153/73 (!) 159/76  Pulse: 78 80 72 70  Resp: 19 18 18    Temp: 98.5 F (36.9 C) 98.2 F (36.8 C) 97.7 F (36.5 C) 98.6 F (37 C)  TempSrc: Oral   Oral  SpO2:  99% 95% 100%     Physical Exam:     General:  adult male in bed in NAD at rest   HEENT: NCAT  Eyes: EOMI sclera anicteric Neck: supple trachea midline  Heart: S1S2 no rub Lungs: clear and unlabored; room air  Abdomen: soft/nt/nd Extremities: bilateral BKA's no edema of residual limbs  Skin: no rash on extremities exposed Psych no anxiety or agitation  Neuro: awake and interactive; has aphasia; nods or shakes head appropriately.    Medications reviewed   Labs:     Latest Ref Rng & Units 09/30/2023   10:10 AM 09/29/2023    6:44 AM 09/28/2023    8:05 AM  BMP  Glucose 70 - 99 mg/dL 595  638  756   BUN 6 - 20 mg/dL 14  12  15    Creatinine 0.61 - 1.24 mg/dL 4.33  2.95  1.88   Sodium 135 - 145 mmol/L 136  135  134   Potassium 3.5 - 5.1 mmol/L 3.6  3.6  3.4   Chloride 98 - 111 mmol/L 106  107  105   CO2 22 - 32 mmol/L 21  20  18    Calcium 8.9 - 10.3 mg/dL 8.3  8.2  8.0       Assessment/Plan:   # AKI  - may be hemodynamic/ischemic ATN in the setting of severe allergic reaction requiring intubation as well as pre-renal insults from gram negative bacteremia.  Transplant ultrasound without hydro - baseline Cr per the [facility] system appears to be in the high 1's - Continue supportive care - improving  - check another post-void residual bladder scan and in/out cath if indicated.  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  Increased cyclosporine to 100 mg BID.  Per his transplant center goal cyclosporine level is 50-125  - cyclosporine trough from 10/4 am is pending    #  Pancreas transplant - note hyperglycemia  - amylase and lipase ok  - glucose management per primary team    # E coli bacteremia - per 09/23/23 blood cultures  - abx per primary team  - stopped myfortic as above given bacteremia  - will repeat blood cultures   # HTN  - Increase terazosin to 4 mg daily  - Continue other current regimen  - improved off of normal saline  # Urinary retention - s/p in/out cath - check post-void residual as above - increased terazosin to 4 mg daily   # Allergic reaction - to belatacept  - note his regimen has been adjusted   # Hypokalemia -replete    Disposition per primary team   Estanislado Emms, MD 09/30/2023 5:19 PM

## 2023-09-30 NOTE — Progress Notes (Addendum)
PROGRESS NOTE   Subjective/Complaints: Pt appears to have had some urine retention last night. Indicates that he's feeling ok this morning.   ROS: limited due to language/communication    Objective:   No results found. Recent Labs    09/28/23 0805 09/29/23 0644  WBC 7.1 7.3  HGB 12.7* 13.0  HCT 39.3 40.9  PLT 273 310   Recent Labs    09/28/23 0805 09/29/23 0644  NA 134* 135  K 3.4* 3.6  CL 105 107  CO2 18* 20*  GLUCOSE 119* 133*  BUN 15 12  CREATININE 2.71* 2.18*  CALCIUM 8.0* 8.2*    Intake/Output Summary (Last 24 hours) at 09/30/2023 0825 Last data filed at 09/30/2023 4098 Gross per 24 hour  Intake 237 ml  Output 2025 ml  Net -1788 ml        Physical Exam: Vital Signs Blood pressure (!) 153/73, pulse 72, temperature 97.7 F (36.5 C), resp. rate 18, SpO2 95%.  General: Alert and oriented x 3, No apparent distress HEENT: Head is normocephalic, atraumatic, PERRLA, EOMI, sclera anicteric, oral mucosa pink and moist, dentition intact, ext ear canals clear,  Neck: Supple without JVD or lymphadenopathy Heart: Reg rate and rhythm. No murmurs rubs or gallops Chest: CTA bilaterally without wheezes, rales, or rhonchi; no distress Abdomen: Soft, non-tender, non-distended, bowel sounds positive. Extremities: No clubbing, cyanosis, or edema. Pulses are 2+ Psych: Pt's affect is appropriate. Pt is cooperative Skin: Clean and intact without signs of breakdown Neuro:  pt is alert. Makes eye contact. Seems to have intact comprehension but limited expressive language (garbled speech mostly), indicated that he had therapy today, follows basic commands. Apraxic and right inattention. Speech dysarthric. RUE 0/5 shoulder/elbow but demonstrated 3-4/5 grip--may have element of motor apraxia. RLE 0/5 Hip/knee. LUE and LLE grossly 5/5 at testable muscles. Decreased sense of pain RUE/RLE Musculoskeletal: residual limbs well  shaped.     Assessment/Plan: 1. Functional deficits which require 3+ hours per day of interdisciplinary therapy in a comprehensive inpatient rehab setting. Physiatrist is providing close team supervision and 24 hour management of active medical problems listed below. Physiatrist and rehab team continue to assess barriers to discharge/monitor patient progress toward functional and medical goals  Care Tool:  Bathing              Bathing assist       Upper Body Dressing/Undressing Upper body dressing        Upper body assist      Lower Body Dressing/Undressing Lower body dressing            Lower body assist       Toileting Toileting    Toileting assist       Transfers Chair/bed transfer  Transfers assist           Locomotion Ambulation   Ambulation assist              Walk 10 feet activity   Assist           Walk 50 feet activity   Assist           Walk 150 feet activity   Assist  Walk 10 feet on uneven surface  activity   Assist           Wheelchair     Assist               Wheelchair 50 feet with 2 turns activity    Assist            Wheelchair 150 feet activity     Assist          Blood pressure (!) 153/73, pulse 72, temperature 97.7 F (36.5 C), resp. rate 18, SpO2 95%.  Medical Problem List and Plan: 1. Functional deficits secondary to watershed stroke with subcortical infarction in the left occipital lobe and posterior left frontal lobe              -patient may shower             -ELOS/Goals: 18-21 days, min assist PT, min assist OT, supervision SLP goals              -Dense R hemiparesis - would order WHO on rehab admission; PRAFO not needed d/t BKA   --Patient is beginning CIR therapies today including PT, OT, and SLP  2.  Antithrombotics: -DVT/anticoagulation:  Pharmaceutical: Lovenox             -antiplatelet therapy: Aspirin and Plavix for three months  followed by aspirin alone (started 9/26)   3. Pain Management: Tylenol as needed   4. Mood/Behavior/Sleep: LCSW to evaluate and provide emotional support             -antipsychotic agents: n/a   5. Neuropsych/cognition: This patient is not quite capable of making decisions on his own behalf.   -continue to monitor closely over the next few days, as improved communication strategies may demonstrate he is approaching decision making capacity.   6. Skin/Wound Care: Routine skin care checks             -Discussed with nursing good skin check of bilateral residual limbs once in bed at rehab   7. Fluids/Electrolytes/Nutrition: strict Is and Os and follow-up chemistries   8: Hypertension: monitor TID and prn             -continue hydralazine 50 mg BID             -continue carvedilol 6.25 mg BID             -continue terazosin 2 mg q HS   10/5 borderline control--observe today 9: Hyperlipidemia: continue statin   10: Chronic immunosuppressive therapy due to kidney/pancreas transplant 2014             -continue Gengraf, prednisone, Bactrim (Myfortic d/c by nephrology)   11: PAD s/p bilateral BKA             -on aspirin and statin             -Ambulates with bilateral prosthesis at home   12: AKI/CKD stage IIIa, s/p transplant: baseline creatinine ~1.6-1.9             -10/5 follow-up Cr 2.18 today which is improved--follow for trend  -nephrology also has been seeing  -labs ordered for monday              13: GERD: continue PPI BID   14: DM s/p pancreas transplant: CBGs QID; A1c = 5.8% (no home meds listed)             -continue SSI             -  consider oral agent             -start carb modified diet   15: Nausea: resolved -discontinue Reglan -continue PPI -Zofran prn   15: UTI: E. coli isolated: continue ceftriaxone 2 grams q 24 hours (day #4)             -last dose last night --observe   16: Urinary retention: purewick in place             -continue PVRs, in and out cath  PRN  -volumes 267-775--urinating in bed into urinal---work on at least EOB emptying -terazosin just increased to 4 mg at bedtime yesterday--observe today             17: Hypokalemia: repleted; follow-up BMP    LOS: 1 days A FACE TO FACE EVALUATION WAS PERFORMED  Ranelle Oyster 09/30/2023, 8:25 AM

## 2023-09-30 NOTE — Plan of Care (Signed)
Problem: RH Balance Goal: LTG: Patient will maintain dynamic sitting balance (OT) Description: LTG:  Patient will maintain dynamic sitting balance with assistance during activities of daily living (OT) Flowsheets (Taken 09/30/2023 1157) LTG: Pt will maintain dynamic sitting balance during ADLs with: Supervision/Verbal cueing Goal: LTG Patient will maintain dynamic standing with ADLs (OT) Description: LTG:  Patient will maintain dynamic standing balance with assist during activities of daily living (OT)  Flowsheets (Taken 09/30/2023 1157) LTG: Pt will maintain dynamic standing balance during ADLs with: Contact Guard/Touching assist   Problem: Sit to Stand Goal: LTG:  Patient will perform sit to stand in prep for activites of daily living with assistance level (OT) Description: LTG:  Patient will perform sit to stand in prep for activites of daily living with assistance level (OT) Flowsheets (Taken 09/30/2023 1157) LTG: PT will perform sit to stand in prep for activites of daily living with assistance level: Minimal Assistance - Patient > 75%   Problem: RH Eating Goal: LTG Patient will perform eating w/assist, cues/equip (OT) Description: LTG: Patient will perform eating with assist, with/without cues using equipment (OT) Flowsheets (Taken 09/30/2023 1157) LTG: Pt will perform eating with assistance level of: Set up assist    Problem: RH Grooming Goal: LTG Patient will perform grooming w/assist,cues/equip (OT) Description: LTG: Patient will perform grooming with assist, with/without cues using equipment (OT) Flowsheets (Taken 09/30/2023 1157) LTG: Pt will perform grooming with assistance level of: Set up assist    Problem: RH Bathing Goal: LTG Patient will bathe all body parts with assist levels (OT) Description: LTG: Patient will bathe all body parts with assist levels (OT) Flowsheets (Taken 09/30/2023 1157) LTG: Pt will perform bathing with assistance level/cueing: Minimal Assistance -  Patient > 75%   Problem: RH Dressing Goal: LTG Patient will perform upper body dressing (OT) Description: LTG Patient will perform upper body dressing with assist, with/without cues (OT). Flowsheets (Taken 09/30/2023 1157) LTG: Pt will perform upper body dressing with assistance level of: Set up assist Goal: LTG Patient will perform lower body dressing w/assist (OT) Description: LTG: Patient will perform lower body dressing with assist, with/without cues in positioning using equipment (OT) Flowsheets (Taken 09/30/2023 1157) LTG: Pt will perform lower body dressing with assistance level of: Minimal Assistance - Patient > 75%   Problem: RH Toileting Goal: LTG Patient will perform toileting task (3/3 steps) with assistance level (OT) Description: LTG: Patient will perform toileting task (3/3 steps) with assistance level (OT)  Flowsheets (Taken 09/30/2023 1157) LTG: Pt will perform toileting task (3/3 steps) with assistance level: Minimal Assistance - Patient > 75%   Problem: RH Functional Use of Upper Extremity Goal: LTG Patient will use RT/LT upper extremity as a (OT) Description: LTG: Patient will use right/left upper extremity as a stabilizer/gross assist/diminished/nondominant/dominant level with assist, with/without cues during functional activity (OT) Flowsheets (Taken 09/30/2023 1157) LTG: Use of upper extremity in functional activities: RUE as gross assist level   Problem: RH Toilet Transfers Goal: LTG Patient will perform toilet transfers w/assist (OT) Description: LTG: Patient will perform toilet transfers with assist, with/without cues using equipment (OT) Flowsheets (Taken 09/30/2023 1157) LTG: Pt will perform toilet transfers with assistance level of: Minimal Assistance - Patient > 75%   Problem: RH Tub/Shower Transfers Goal: LTG Patient will perform tub/shower transfers w/assist (OT) Description: LTG: Patient will perform tub/shower transfers with assist, with/without cues using  equipment (OT) Flowsheets (Taken 09/30/2023 1157) LTG: Pt will perform tub/shower stall transfers with assistance level of: Minimal Assistance - Patient >  75%   

## 2023-10-01 DIAGNOSIS — I639 Cerebral infarction, unspecified: Secondary | ICD-10-CM | POA: Diagnosis not present

## 2023-10-01 DIAGNOSIS — I10 Essential (primary) hypertension: Secondary | ICD-10-CM | POA: Diagnosis not present

## 2023-10-01 DIAGNOSIS — N390 Urinary tract infection, site not specified: Secondary | ICD-10-CM | POA: Diagnosis not present

## 2023-10-01 DIAGNOSIS — R339 Retention of urine, unspecified: Secondary | ICD-10-CM | POA: Diagnosis not present

## 2023-10-01 LAB — CBC
HCT: 40.8 % (ref 39.0–52.0)
Hemoglobin: 12.9 g/dL — ABNORMAL LOW (ref 13.0–17.0)
MCH: 30.2 pg (ref 26.0–34.0)
MCHC: 31.6 g/dL (ref 30.0–36.0)
MCV: 95.6 fL (ref 80.0–100.0)
Platelets: 409 10*3/uL — ABNORMAL HIGH (ref 150–400)
RBC: 4.27 MIL/uL (ref 4.22–5.81)
RDW: 16.2 % — ABNORMAL HIGH (ref 11.5–15.5)
WBC: 8 10*3/uL (ref 4.0–10.5)
nRBC: 0 % (ref 0.0–0.2)

## 2023-10-01 LAB — GLUCOSE, CAPILLARY
Glucose-Capillary: 124 mg/dL — ABNORMAL HIGH (ref 70–99)
Glucose-Capillary: 132 mg/dL — ABNORMAL HIGH (ref 70–99)
Glucose-Capillary: 134 mg/dL — ABNORMAL HIGH (ref 70–99)
Glucose-Capillary: 99 mg/dL (ref 70–99)

## 2023-10-01 LAB — RENAL FUNCTION PANEL
Albumin: 2 g/dL — ABNORMAL LOW (ref 3.5–5.0)
Anion gap: 10 (ref 5–15)
BUN: 17 mg/dL (ref 6–20)
CO2: 21 mmol/L — ABNORMAL LOW (ref 22–32)
Calcium: 8.3 mg/dL — ABNORMAL LOW (ref 8.9–10.3)
Chloride: 106 mmol/L (ref 98–111)
Creatinine, Ser: 2.45 mg/dL — ABNORMAL HIGH (ref 0.61–1.24)
GFR, Estimated: 32 mL/min — ABNORMAL LOW (ref >60)
Glucose, Bld: 128 mg/dL — ABNORMAL HIGH (ref 70–99)
Phosphorus: 3.8 mg/dL (ref 2.5–4.6)
Potassium: 3.8 mmol/L (ref 3.5–5.1)
Sodium: 137 mmol/L (ref 135–145)

## 2023-10-01 NOTE — Progress Notes (Signed)
Occupational Therapy Session Note  Patient Details  Name: [name] MRN: [id number] Date of Birth: [birth year]  Today's Date: 10/01/2023 OT Individual Time: 9562-1308 OT Individual Time Calculation (min): 72 min    Short Term Goals: Week 1:  OT Short Term Goal 1 (Week 1): Patient will complete toilet transfer with mod A of 1 OT Short Term Goal 2 (Week 1): Patient will recall hemi-dressing techniques with min cues OT Short Term Goal 3 (Week 1): Patient will complete 1 step of LB dressing task  Skilled Therapeutic Interventions/Progress Updates:      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), R hemi, avoid hypotension Restrictions Weight Bearing Restrictions: No General: "I'm ready." Pt seated in W/C upon OT arrival, agreeable to OT. Wife present for session. Session held outside for increased mood and QoL. Pt presenting with semi intelligible speech, appropriate responses to yes/no responses.    Pain: no pain reported  Exercises: Pt participated in the following [facility] activity for increased dexterity and coordination in order to complete ADLs such as clothing management. Pt completed tasks with PRN rest breaks d/t increased fatigue in RUE: -pt retrieving pink sponge pieces  with Rt hand, demo'ing fair, although delayed, grip and placing them onto table   -using LUE to place sponge in hand Rt hand for increased midline awareness -2x20 power grip with pink sponge first with focus on overall grip, second trial with focus on grasp and full release -pronation/supination with pincer grasp with retrieving sponge piece off of table  OT providing AAROM for proximal RUE, pt able to control distal RUE/hand.   ROM: OT completed PROM and AAROM in RUE in all planes in order to decrease muscle stiffness. Pt demo's increased distal RUE functional compared to proximal.    Other Treatments: OT  provided education to pt and wife on hemi positioning in order to provide support to affected limb during sleep and wake. Pt and wife educated on maintaining awareness of RUE in order to prevent injury or positions of discomfort. Pt issued and educated on theraputty and sponge exercises to complete in the room for increased NMRE for increased independence with ADLs such as dressing.    Pt seated in W/C at end of session with W/C alarm donned, call light within reach and 4Ps assessed. Wife present.    Therapy/Group: Individual Therapy  Velia Meyer, OTD, OTR/L 10/01/2023, 3:47 PM

## 2023-10-01 NOTE — Progress Notes (Signed)
[facility] Progress Note  Name: ALIJA HELLEN MRN: [id number] DOB: [birth year]   Subjective:  He had 2.4 liters UOP charted over 10/5 and 5 unmeasured urine voids.  Patient asked if bladder scan frequency could be reduced as it was ordered every few hours per rehab protocol.  Rehab team recommended BID bladder scans.  His wife is at bedside - they were able to go off the floor for a bit earlier today which was good.  He has been speaking much better today.  Before his stroke, she had noticed that his "blood pressure was perfect - but it's never perfect".    Review of systems:     Denies nausea/v/d Denies shortness of breath or chest pain Hx limited by known aphasia    Intake/Output Summary (Last 24 hours) at 10/01/2023 1752 Last data filed at 10/01/2023 1306 Gross per 24 hour  Intake 474 ml  Output 2150 ml  Net -1676 ml    Vitals:  Vitals:   09/30/23 1247 09/30/23 1844 10/01/23 0544 10/01/23 1307  BP: (!) 159/76 (!) 146/65 (!) 186/80 (!) 151/72  Pulse: 70 80 72 66  Resp:  17 18 18   Temp: 98.6 F (37 C) 98.2 F (36.8 C) 98.4 F (36.9 C) 98.1 F (36.7 C)  TempSrc: Oral     SpO2: 100% 98% 98% 96%     Physical Exam:     General:  adult male in bed in NAD at rest   HEENT: NCAT  Eyes: EOMI sclera anicteric Neck: supple trachea midline  Heart: S1S2 no rub Lungs: clear and unlabored; room air  Abdomen: soft/nt/nd Extremities: bilateral BKA's no edema of residual limbs  Skin: no rash on extremities exposed Psych no anxiety or agitation  Neuro: awake and interactive; has aphasia; nods or shakes head appropriately.  Answers some questions in full phrases.  Spells his first name but not able to say it.   Medications reviewed   Labs:     Latest Ref Rng & Units 10/01/2023    8:01 AM 09/30/2023   10:10 AM 09/29/2023    6:44 AM  BMP  Glucose 70 - 99 mg/dL 536  644  034   BUN 6 - 20 mg/dL 17  14  12    Creatinine 0.61 - 1.24 mg/dL 7.42  5.95  6.38    Sodium 135 - 145 mmol/L 137  136  135   Potassium 3.5 - 5.1 mmol/L 3.8  3.6  3.6   Chloride 98 - 111 mmol/L 106  106  107   CO2 22 - 32 mmol/L 21  21  20    Calcium 8.9 - 10.3 mg/dL 8.3  8.3  8.2      Assessment/Plan:   # AKI  - may be hemodynamic/ischemic ATN in the setting of severe allergic reaction requiring intubation as well as pre-renal insults from gram negative bacteremia.  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  Increased cyclosporine to 100 mg BID.  Per his transplant center goal cyclosporine level is 50-125  - cyclosporine trough from 10/4 am is still pending    # Pancreas transplant - note hyperglycemia  - amylase and lipase ok  - glucose management per primary team    # E coli bacteremia - per 09/23/23 blood cultures  - abx per primary team  - stopped myfortic as above given bacteremia  - repeat blood cultures from 10/4 are no growth to date   # HTN  - Increased terazosin to 4 mg daily  - Continue other current regimen  - improved off of normal saline  # Urinary retention - s/p in/out cath - check post-void residual as above - increased terazosin to 4 mg daily - Getting BID bladder scans for now per rehab team    # Allergic reaction - to belatacept  - note  his regimen has been adjusted   # Hypokalemia - improved with repletion    Disposition per primary team   Estanislado Emms, MD 10/01/2023 6:15 PM

## 2023-10-01 NOTE — Progress Notes (Signed)
PROGRESS NOTE   Subjective/Complaints: Family in room. Pt slept well. Had a good day yesterday. Improving language  ROS: Patient denies fever, rash, sore throat, blurred vision, dizziness, nausea, vomiting, diarrhea, cough, shortness of breath or chest pain, joint or back/neck pain, headache, or mood change.    Objective:   No results found. Recent Labs    09/29/23 0644 09/30/23 1010  WBC 7.3 8.2  HGB 13.0 13.4  HCT 40.9 41.8  PLT 310 377   Recent Labs    09/29/23 0644 09/30/23 1010  NA 135 136  K 3.6 3.6  CL 107 106  CO2 20* 21*  GLUCOSE 133* 133*  BUN 12 14  CREATININE 2.18* 2.40*  CALCIUM 8.2* 8.3*    Intake/Output Summary (Last 24 hours) at 10/01/2023 0754 Last data filed at 10/01/2023 0129 Gross per 24 hour  Intake 594 ml  Output 2375 ml  Net -1781 ml        Physical Exam: Vital Signs Blood pressure (!) 186/80, pulse 72, temperature 98.4 F (36.9 C), resp. rate 18, SpO2 98%.  Constitutional: No distress . Vital signs reviewed. HEENT: NCAT, EOMI, oral membranes moist Neck: supple Cardiovascular: RRR without murmur. No JVD    Respiratory/Chest: CTA Bilaterally without wheezes or rales. Normal effort    GI/Abdomen: BS +, non-tender, non-distended Ext: no clubbing, cyanosis, or edema Psych: flat but cooperative  Skin: Clean and intact without signs of breakdown Neuro: pt just waking up Makes eye contact. Word finding deficits, sometimes garbled speech, comprehension more consistent but Apraxic and right inattention. Speech dysarthric. RUE 0/5 shoulder/elbow but demonstrated 3-4/5 grip--may have element of motor apraxia. RLE 0/5 Hip/knee. LUE and LLE grossly 5/5 at testable muscles. Decreased sense of pain RUE/RLE Musculoskeletal: residual limbs well shaped.     Assessment/Plan: 1. Functional deficits which require 3+ hours per day of interdisciplinary therapy in a comprehensive inpatient rehab  setting. Physiatrist is providing close team supervision and 24 hour management of active medical problems listed below. Physiatrist and rehab team continue to assess barriers to discharge/monitor patient progress toward functional and medical goals  Care Tool:  Bathing    Body parts bathed by patient: Chest, Abdomen, Front perineal area, Face   Body parts bathed by helper: Buttocks, Left upper leg, Right upper leg, Left arm, Right arm Body parts n/a: Left lower leg, Right lower leg   Bathing assist Assist Level: Maximal Assistance - Patient 24 - 49%     Upper Body Dressing/Undressing Upper body dressing   What is the patient wearing?: Pull over shirt    Upper body assist Assist Level: Moderate Assistance - Patient 50 - 74%    Lower Body Dressing/Undressing Lower body dressing      What is the patient wearing?: Pants     Lower body assist Assist for lower body dressing: Maximal Assistance - Patient 25 - 49%     Toileting Toileting    Toileting assist Assist for toileting: Maximal Assistance - Patient 25 - 49%     Transfers Chair/bed transfer  Transfers assist     Chair/bed transfer assist level: Maximal Assistance - Patient 25 - 49%     Locomotion Ambulation  Ambulation assist   Ambulation activity did not occur: Safety/medical concerns          Walk 10 feet activity   Assist  Walk 10 feet activity did not occur: Safety/medical concerns        Walk 50 feet activity   Assist Walk 50 feet with 2 turns activity did not occur: Safety/medical concerns         Walk 150 feet activity   Assist Walk 150 feet activity did not occur: Safety/medical concerns         Walk 10 feet on uneven surface  activity   Assist Walk 10 feet on uneven surfaces activity did not occur: Safety/medical concerns         Wheelchair     Assist Is the patient using a wheelchair?: Yes Type of Wheelchair: Manual    Wheelchair assist level:  Moderate Assistance - Patient 50 - 74% Max wheelchair distance: 150    Wheelchair 50 feet with 2 turns activity    Assist        Assist Level: Moderate Assistance - Patient 50 - 74%   Wheelchair 150 feet activity     Assist      Assist Level: Moderate Assistance - Patient 50 - 74%   Blood pressure (!) 186/80, pulse 72, temperature 98.4 F (36.9 C), resp. rate 18, SpO2 98%.  Medical Problem List and Plan: 1. 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  Antithrombotics: -DVT/anticoagulation:  Pharmaceutical: Lovenox             -antiplatelet therapy: Aspirin and Plavix for three months followed by aspirin alone (started 9/26)   3. Pain Management: Tylenol as needed   4. Mood/Behavior/Sleep: LCSW to evaluate and provide emotional support             -antipsychotic agents: n/a   5. Neuropsych/cognition: This patient is not quite capable of making decisions on his own behalf.   -language improving. -may have capacity soon.   6. Skin/Wound Care: Routine skin care checks             -Discussed with nursing good skin check of bilateral residual limbs once in bed at rehab   7. Fluids/Electrolytes/Nutrition: strict Is and Os and follow-up chemistries   8: Hypertension: monitor TID and prn             -continue hydralazine 50 mg BID             -continue carvedilol 6.25 mg BID             -continue terazosin 2 mg q HS   10/6 continue elevation, particularly in morning. 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             -continue Gengraf, prednisone, Bactrim (Myfortic d/c by nephrology)  11: PAD s/p bilateral BKA             -on aspirin and statin             -Ambulates with bilateral prostheses at home   12: AKI/CKD stage IIIa, s/p transplant: baseline creatinine ~1.6-1.9             -10/5 follow-up Cr 2.18 today which is improved--follow for trend  -nephrology also has been seeing  -labs ordered for monday              13: GERD: continue PPI BID   14: DM s/p pancreas transplant: CBGs QID; A1c = 5.8% (no home meds listed)             -continue SSI             -consider oral agent             -started carb modified diet   15: Nausea: resolved -discontinue Reglan -continue PPI -Zofran prn   15: UTI: E. coli isolated: continue ceftriaxone 2 grams q 24 hours (day #4)             -completed abx--observe   16: Urinary retention: purewick in place             10/6-continue PVRs thru today perhaps, in and out cath PRN   -pvr's all lesss than 160cc now, continent  -eob to void when possible  -terazosin now at 4 mg at bedtime               17: Hypokalemia: repleted; follow-up BMP    LOS: 2 days A FACE TO FACE EVALUATION WAS PERFORMED  Ranelle Oyster 10/01/2023, 7:54 AM

## 2023-10-01 NOTE — Progress Notes (Signed)
   09/30/23 2100 10/01/23 0129 10/01/23 0550  Output (mL)  Urine 300 mL 400 mL  --   Unmeasured Output  Urine Occurrence  --   --  1 (spilled urinal per wife)  Stool Occurrence 1 (per wife)  --   --   Urine Characteristics  Urinary Incontinence No No No (did not view; wife assisted pt)  Urine Color  (per wife) Yellow/straw  --   Urine Appearance  --  Clear  --   Urinary Interventions Post void residual Post void residual Post void residual  Post Void Residual 75 mL 158 mL 5 mL  Hygiene  --   --  Bath;Peri care (pt wife provided care)   No in/out caths needed this shift.

## 2023-10-02 DIAGNOSIS — I639 Cerebral infarction, unspecified: Secondary | ICD-10-CM | POA: Diagnosis not present

## 2023-10-02 LAB — CBC
HCT: 41.3 % (ref 39.0–52.0)
Hemoglobin: 13 g/dL (ref 13.0–17.0)
MCH: 30 pg (ref 26.0–34.0)
MCHC: 31.5 g/dL (ref 30.0–36.0)
MCV: 95.4 fL (ref 80.0–100.0)
Platelets: 477 10*3/uL — ABNORMAL HIGH (ref 150–400)
RBC: 4.33 MIL/uL (ref 4.22–5.81)
RDW: 16.2 % — ABNORMAL HIGH (ref 11.5–15.5)
WBC: 7.3 10*3/uL (ref 4.0–10.5)
nRBC: 0 % (ref 0.0–0.2)

## 2023-10-02 LAB — GLUCOSE, CAPILLARY
Glucose-Capillary: 128 mg/dL — ABNORMAL HIGH (ref 70–99)
Glucose-Capillary: 120 mg/dL — ABNORMAL HIGH (ref 70–99)
Glucose-Capillary: 144 mg/dL — ABNORMAL HIGH (ref 70–99)
Glucose-Capillary: 130 mg/dL — ABNORMAL HIGH (ref 70–99)

## 2023-10-02 LAB — BASIC METABOLIC PANEL
Anion gap: 10 (ref 5–15)
BUN: 18 mg/dL (ref 6–20)
CO2: 20 mmol/L — ABNORMAL LOW (ref 22–32)
Calcium: 8.4 mg/dL — ABNORMAL LOW (ref 8.9–10.3)
Chloride: 105 mmol/L (ref 98–111)
Creatinine, Ser: 2.29 mg/dL — ABNORMAL HIGH (ref 0.61–1.24)
GFR, Estimated: 34 mL/min — ABNORMAL LOW (ref >60)
Glucose, Bld: 126 mg/dL — ABNORMAL HIGH (ref 70–99)
Potassium: 3.6 mmol/L (ref 3.5–5.1)
Sodium: 135 mmol/L (ref 135–145)

## 2023-10-02 LAB — CYCLOSPORINE: Cyclosporine, LabCorp: 75 ng/mL — ABNORMAL LOW (ref 100–400)

## 2023-10-02 MED ORDER — POTASSIUM CHLORIDE CRYS ER 10 MEQ PO TBCR
10.0000 meq | EXTENDED_RELEASE_TABLET | Freq: Every day | ORAL | Status: DC
  Administered 2023-10-02 – 2023-10-19 (×18): 10 meq via ORAL
  Filled 2023-10-02 (×19): qty 1

## 2023-10-02 MED ORDER — HYDRALAZINE HCL 50 MG PO TABS
50.0000 mg | ORAL_TABLET | Freq: Three times a day (TID) | ORAL | Status: DC
  Administered 2023-10-02 – 2023-10-08 (×18): 50 mg via ORAL
  Filled 2023-10-02 (×20): qty 1

## 2023-10-02 NOTE — Progress Notes (Signed)
[name] Progress Note   Assessment/ Plan:   # AKI  - may be hemodynamic/ischemic ATN in the setting of severe allergic reaction requiring intubation as well as pre-renal insults from gram negative bacteremia.  Transplant ultrasound without hydro - Baseline Cr per the [facility] system appears to be in the high 1's - Continue supportive care  - bladder scans are ordered BID  - optimized from transplant/ renal POV- will sign off.  Call with questions.   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  Increased cyclosporine to 100 mg BID.  Per his transplant center goal cyclosporine level is 50-125  - cyclosporine trough from 10/4 am is at goal   # Pancreas transplant - note hyperglycemia  - amylase and lipase ok  - glucose management per primary team    # E coli bacteremia - per 09/23/23 blood cultures  - abx per primary team  - stopped myfortic as above given bacteremia  - repeat blood cultures from 10/4 are no growth to date   # HTN  - Increased terazosin to 4 mg daily  - Continue other current regimen  - improved off of normal saline   # Urinary retention - s/p in/out cath - check post-void residual as above - increased terazosin to 4 mg daily - Getting BID bladder scans for now per rehab team    # Allergic reaction - to belatacept  - note his regimen has been adjusted (stopped bela,  transitioned to cyclosporine)   # Hypokalemia - improved with repletion   Subjective:    Seen today in room.  Doing OK today.  Just finished therapies.  Cr improving.     Objective:   BP 124/69 (BP Location: Left Arm)   Pulse 72   Temp 97.9 F (36.6 C) (Oral)   Resp 18   SpO2 100%   Physical Exam: Gen:NAD, sitting in wheelchair CVS: RRR Resp: clear Abd: soft Ext: no LE edema s/p bilateral BKA  Labs: BMET Recent Labs  Lab 09/26/23 0525 09/26/23 1104 09/27/23 0508 09/28/23 0805 09/29/23 0644 09/30/23 1010 10/01/23 0801 10/02/23 0648  NA  --  134* 135 134* 135 136 137 135  K  --  3.5 3.4* 3.4* 3.6 3.6 3.8 3.6  CL  --  105 107 105 107 106 106 105  CO2  --  21* 19* 18* 20* 21* 21* 20*  GLUCOSE  --  115* 119* 119* 133* 133* 128* 126*  BUN  --  19 18 15 12 14 17 18   CREATININE  --  2.88* 2.90* 2.71* 2.18* 2.40* 2.45* 2.29*  CALCIUM  --  8.0* 8.0* 8.0* 8.2* 8.3* 8.3* 8.4*  PHOS 3.0  --  3.0 3.8 3.7  --  3.8  --    CBC Recent Labs  Lab 09/29/23 0644 09/30/23 1010 10/01/23 0801 10/02/23 1610  WBC 7.3 8.2 8.0 7.3  NEUTROABS  --  5.6  --   --   HGB 13.0 13.4 12.9* 13.0  HCT 40.9 41.8 40.8 41.3  MCV 95.1 97.0 95.6 95.4  PLT 310 377 409* 477*      Medications:     aspirin EC  81 mg Oral Daily   carvedilol  6.25 mg Oral BID WC   clopidogrel  75 mg Oral Daily   cycloSPORINE modified  100 mg Oral BID   enoxaparin (LOVENOX) injection  40 mg Subcutaneous Daily   hydrALAZINE  50 mg Oral Q8H   insulin aspart  0-15 Units Subcutaneous TID AC & HS   melatonin  5 mg Oral QHS   pantoprazole  40 mg Oral BID   predniSONE  5 mg Oral Daily   rosuvastatin  20 mg Oral Daily   senna-docusate  1 tablet Oral QHS   sulfamethoxazole-trimethoprim  1 tablet Oral Q M,W,F   terazosin  4 mg Oral QHS     Bufford Buttner, MD 10/02/2023, 2:32 PM

## 2023-10-02 NOTE — Discharge Instructions (Addendum)
Inpatient Rehab Discharge Instructions  [name] Discharge date and time:  10/19/23  Activities/Precautions/ Functional Status: Activity: no lifting, driving, or strenuous exercise until cleared by MD Diet: diabetic diet Wound Care: none needed  Functional status:  ___ No restrictions     ___ Walk up steps independently _x__ 24/7 supervision/assistance   ___ Walk up steps with assistance ___ Intermittent supervision/assistance  ___ Bathe/dress independently ___ Walk with walker     ___ Bathe/dress with assistance ___ Walk Independently    ___ Shower independently ___ Walk with assistance    _x__ Shower with assistance _x__ No alcohol     ___ Return to work/school ________  Special Instructions:   No driving, alcohol consumption or tobacco use.  Recommend daily BP measurement in same arm and record time of day. Bring this information with you to follow-up appointment with PCP.    COMMUNITY REFERRALS UPON DISCHARGE:    Home Health:   PT     OT     ST                   Agency: [facility] Phone: [phone]    Medical Equipment/Items Ordered: Hospital Bed, Rolling Walker and Bedside Commode                                                 Agency/Supplier: [employer] [phone]    STROKE/TIA DISCHARGE INSTRUCTIONS SMOKING Cigarette smoking nearly doubles your risk of having a stroke & is the single most alterable risk factor  If you smoke or have smoked in the last 12 months, you are advised to quit smoking for your health. Most of the excess cardiovascular risk related to smoking disappears within a year of stopping. Ask you doctor about anti-smoking medications [place] Quit Line: 1-800-QUIT NOW Free Smoking Cessation Classes [phone]  CHOLESTEROL Know your levels; limit fat & cholesterol in your diet  Lipid Panel     Component Value Date/Time   CHOL 173 07/20/2023 1634   TRIG 126 09/25/2023 0708   HDL 45.00 07/20/2023 1634   CHOLHDL 4 07/20/2023 1634   VLDL 37.8  07/20/2023 1634   LDLCALC 90 07/20/2023 1634     Many patients benefit from treatment even if their cholesterol is at goal. Goal: Total Cholesterol (CHOL) less than 160 Goal:  Triglycerides (TRIG) less than 150 Goal:  HDL greater than 40 Goal:  LDL (LDLCALC) less than 100   BLOOD PRESSURE American Stroke Association blood pressure target is less that 120/80 mm/Hg  Your discharge blood pressure is:  BP: (!) 144/63 Monitor your blood pressure Limit your salt and alcohol intake Many individuals will require more than one medication for high blood pressure  DIABETES (A1c is a blood sugar average for last 3 months) Goal HGBA1c is under 7% (HBGA1c is blood sugar average for last 3 months)  Diabetes: No known diagnosis of diabetes    Lab Results  Component Value Date   HGBA1C 5.8 09/12/2023    Your HGBA1c can be lowered with medications, healthy diet, and exercise. Check your blood sugar as directed by your physician Call your physician if you experience unexplained or low blood sugars.  PHYSICAL ACTIVITY/REHABILITATION Goal is 30 minutes at least 4 days per week  Activity: Increase activity slowly, Therapies: Physical Therapy: Home Health, Occupational Therapy: Home Health, and Speech Therapy: Home Health  Return to work: when cleared by MD Activity decreases your risk of heart attack and stroke and makes your heart stronger.  It helps control your weight and blood pressure; helps you relax and can improve your mood. Participate in a regular exercise program. Talk with your doctor about the best form of exercise for you (dancing, walking, swimming, cycling).  DIET/WEIGHT Goal is to maintain a healthy weight  Your discharge diet is:  Diet Order             Diet Carb Modified Fluid consistency: Thin  Diet effective now                  thin liquids Your height is:    Your current weight is:   Your Body Mass Index (BMI) is:    Following the type of diet specifically designed for  you will help prevent another stroke. Your goal weight range is:   Your goal Body Mass Index (BMI) is 19-24. Healthy food habits can help reduce 3 risk factors for stroke:  High cholesterol, hypertension, and excess weight.  RESOURCES Stroke/Support Group:  Call [phone]   STROKE EDUCATION PROVIDED/REVIEWED AND GIVEN TO PATIENT Stroke warning signs and symptoms How to activate emergency medical system (call 911). Medications prescribed at discharge. Need for follow-up after discharge. Personal risk factors for stroke. Pneumonia vaccine given: No Flu vaccine given: No My questions have been answered, the writing is legible, and I understand these instructions.  I will adhere to these goals & educational materials that have been provided to me after my discharge from the hospital.     My questions have been answered and I understand these instructions. I will adhere to these goals and the provided educational materials after my discharge from the hospital.  Patient/Caregiver Signature _______________________________ Date __________  Clinician Signature _______________________________________ Date __________  Please bring this form and your medication list with you to all your follow-up doctor's appointments.

## 2023-10-02 NOTE — Progress Notes (Signed)
Inpatient Rehabilitation Care Coordinator Assessment and Plan Patient Details  Name: [name] MRN: [id number] Date of Birth: [birth year]  Today's Date: 10/02/2023  Hospital Problems: Principal Problem:   Acute CVA (cerebrovascular accident) [facility]) Active Problems:   Ischemic cerebrovascular accident (CVA) due to global hypoperfusion with watershed infarction [facility])  Past Medical History:  Past Medical History:  Diagnosis Date   AMPUTATION, BELOW KNEE, 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   Diabetes mellitus without complication [facility])    no since pancreas transplant   Dialysis patient [facility]) 04/18/2012   "[facility]; [place], [place], [place]"   Gastroparesis    Gastropathy    GERD (gastroesophageal reflux disease)    Hypertension    MRSA infection    over 10 years ago per patient. in legs   Past Surgical History:  Past Surgical History:  Procedure Laterality Date   AV FISTULA PLACEMENT  08/2011   left upper arm   BELOW KNEE LEG AMPUTATION  "it's been awhile"   bilaterally   CATARACT EXTRACTION  ~ 2011   right   COMBINED KIDNEY-PANCREAS TRANSPLANT  2014   ESOPHAGOGASTRODUODENOSCOPY N/A 12/30/2016   Procedure: ESOPHAGOGASTRODUODENOSCOPY (EGD);  Surgeon: Jeani Hawking, MD;  Location: [facility] ENDOSCOPY;  Service: Endoscopy;  Laterality: N/A;   OLECRANON BURSECTOMY Right 06/23/2020   Procedure: RIGHT ELBOW EXCISION OLECRANON BURSITIS;  Surgeon: Nadara Mustard, MD;  Location: [facility];  Service: Orthopedics;  Laterality: Right;   Social History:  reports that he quit smoking about 14 months ago. His smoking use included cigars. He has never used smokeless tobacco. He reports that he does not currently use drugs after having used the following drugs: Marijuana. Frequency: 3.00 times per week. He reports  that he does not drink alcohol.  Family / Support Systems Marital Status: Married How Long?: N/A Patient Roles: Spouse Spouse/Significant Other: Tonna Boehringer Children: n/a Other Supports: n/a Anticipated Caregiver: Spouse, Delaney Meigs (Plans to take FMLA or [facility]) Ability/Limitations of Caregiver: Spouse [facility] at [facility] & works. Anticipates to take FMLA or [facility] Caregiver Availability: 24/7 Family Dynamics: supoortive spouse  Social History Preferred language: English Religion: Christian Cultural Background: N/A Education: N/A Health Literacy - How often do you need to have someone help you when you read instructions, pamphlets, or other written material from your doctor or pharmacy?: Never Writes: Yes Legal History/Current Legal Issues: n/a Guardian/Conservator: n/a   Abuse/Neglect Abuse/Neglect Assessment Can Be Completed: Yes Physical Abuse: Denies Verbal Abuse: Denies Sexual Abuse: Denies Exploitation of patient/patient's resources: Denies Self-Neglect: Denies  Patient response to: Social Isolation - How often do you feel lonely or isolated from those around you?: Never  Emotional Status Pt's affect, behavior and adjustment status: Pleasant! Spoke with spouse via telephone. Recent Psychosocial Issues: Coping Psychiatric History: Hx of depression Substance Abuse History: n/a  Patient / Family Perceptions, Expectations & Goals Pt/Family understanding of illness & functional limitations: yes. Primary caregiver, spouse contacted. Premorbid pt/family roles/activities: Independent overall Anticipated changes in roles/activities/participation: Spouse to assist and supervise. Pt/family expectations/goals: Sup/Min A  [employer]: None Premorbid Home Care/DME Agencies: None Transportation available at discharge: spouse Is the patient able to respond to transportation needs?: Yes In the past 12 months, has lack of transportation kept you from medical  appointments or from getting medications?: No In the past 12 months, has lack of transportation kept you from meetings, work, or from getting things needed for daily living?: No Resource referrals recommended: Neuropsychology  Discharge Planning Living Arrangements: Spouse/significant other Support Systems: Spouse/significant other Type of Residence: Private residence (2 level home, 4 steps to enter, 1/2 bath on main level.) Insurance Resources: [employer] (specify county), Media planner (specify) ([employer] Medicare) Surveyor, quantity Resources: Family Support Financial Screen Referred: No Living Expenses: Lives with family Money Management: Spouse, Patient Does the patient have any problems obtaining your medications?: No Home Management: Independent Patient/Family Preliminary Plans: Spouse able to assist with all cogntive tasks Care Coordinator Barriers to Discharge: Insurance for SNF coverage, Decreased caregiver support, Lack of/limited family support Care Coordinator Anticipated Follow Up Needs: HH/OP Expected length of stay: 18-21 Days  Clinical Impression SW met with patient, introduced self and explained role. Patient plans to discharge home with his spouse to assist. Sw spoke with spouse via telephone. Pt spouse currently works and plans to take [employer] or [facility] ([facility] for [facility]). Patient discharging to a 2 level home, 4 steps to enter and half bath on main level. SW will contact spouse by phone on Wednesday with updates. NO additional questions or concerns.  Andria Rhein 10/02/2023, 1:20 PM

## 2023-10-02 NOTE — Progress Notes (Signed)
Physical Therapy Session Note  Patient Details  Name: [name] MRN: [id number] Date of Birth: [birth year]  Today's Date: 10/02/2023 PT Individual Time: 1301-1419 PT Individual Time Calculation (min): 78 min   Short Term Goals: Week 1:  PT Short Term Goal 1 (Week 1): Pt will complete transfer min assist x1 PT Short Term Goal 2 (Week 1): Pt will complete bed mobility with min assist PT Short Term Goal 3 (Week 1): Pt will complete WC mobility with supervision 150' PT Short Term Goal 4 (Week 1): Pt will initiate gait training  Skilled Therapeutic Interventions/Progress Updates:  Patient seated upright in w/c on entrance to room. Patient alert and agreeable to PT session.   Patient with no pain complaint at start of session. Requires MinA to don R prosthetic d/t reduced use of R hand but initiates well.   NT present as pt does not like lunch and she is able to get a new order from him to call for new lunch tray.   Pt does demo impulsivity throughout session d/t reduced awareness of impairments and safety.   Therapeutic Activity: Transfers: W/c > mat table squat pivot with modA and pt attempting to stand rather than laterally scoot/ shift. Requires significant cueing and visual demonstration prior to attempt for improving start position for efficiency. Transfers back to w/c toward R side after provision of verbal instructions and visual demonstration for head/ hips relationship and pt is able to perform with MinA.   Pt performed sit<>stand transfers throughout session with ModA for maintaining balance with LUE support to RW/ back of chair. Provided verbal cues for technique throughout as pt does not remember some aspects and then is also impulsive to stand prior to R foot positioning more posteriorly.  Wheelchair Mobility:  Pt propelled wheelchair 120 feet with supervision using hemitechnique. Provided vc/ tc for improving technique with difficulties in maintaining straight  path.  Gait Training:  Pt ambulated *** ft using *** with ***. Demonstrated ***. Provided vc/ tc for ***.  Neuromuscular Re-ed: NMR facilitated during session with focus on***. Pt guided in ***. NMR performed for improvements in motor control and coordination, balance, sequencing, judgement, and self confidence/ efficacy in performing all aspects of mobility at highest level of independence.    Patient *** at end of session with brakes locked, *** alarm set, and all needs within reach.   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

## 2023-10-02 NOTE — Consult Note (Signed)
[facility] Customer service manager [facility]) Accountable Care Organization (ACO) [facility] Liaison Note  10/02/2023  [name] 04-02-1976 [id number]  Covering Charlesetta Shanks [facility] Liaison)  Location: [facility] Liaison screened the patient remotely at [facility].  Insurance: [employer]   Talvin J Micciche is a 47 y.o. male who is a Primary Care Patient of [name], MD. The patient was screened for  readmission hospitalization with noted extreme risk score for unplanned readmission risk with 4 IP/1 ED in 6 months.  The patient was assessed for potential [facility]) Care Management service needs for post hospital transition for care coordination. Review of patient's electronic medical record reveals patient readmitted 9/23-10/4/24 and discharged to CIR.    Plan:  Pt discharged to the CIR unit for further rehabilitation.   [facility] Care Management/Population Health does not replace or interfere with any arrangements made by the Inpatient Transition of Care team.   For questions contact:   Elliot Cousin, RN, [facility] Liaison [place]   Population Health Office Hours MTWF  8:00 am-6:00 pm [phone] mobile [phone] [Office toll free line] Office Hours are M-F 8:30 - 5 pm [email or web address]

## 2023-10-02 NOTE — Progress Notes (Signed)
Patient refused HS sliding scale coverage cbg 130

## 2023-10-02 NOTE — Progress Notes (Signed)
Occupational Therapy Session Note  Patient Details  Name: LEGRAND LASSER MRN: [id number] Date of Birth: [birth year]  Today's Date: 10/02/2023 OT Individual Time: 9562-1308  [phone] unattended estim) OT Individual Time Calculation (min): 75 min    Short Term Goals: Week 1:  OT Short Term Goal 1 (Week 1): Patient will complete toilet transfer with mod A of 1 OT Short Term Goal 2 (Week 1): Patient will recall hemi-dressing techniques with min cues OT Short Term Goal 3 (Week 1): Patient will complete 1 step of LB dressing task  Skilled Therapeutic Interventions/Progress Updates:    Pt received in bed ready for therapy.  Focus of therapy session on pt learning hemi strategy techniques for bathing and dressing.  Pt agreeable to bathing and dressing.  Recommended he complete LB self care bed level so he could roll and scoot prior to donning B prosthetics.  He was able to wash his LB 90% himself and then don LB clothing mod A.  Sat to EOB with mod A and able to hold balance at EOB as he did UB self care with mod A.   Pt then needed mod A to don prosthetics as he can only use his LUE.  He worked on a scoot pivot to the L to w/c with a heavy MOD A.    Once in w/c , pt declined need to brush teeth. Pt taken to gym to focus on RUE with a/arom for hand as he is able to flex and extend fingers.  Provided pt with a more resistive foam block as his grasp strength is improving.   Pt did 12 min of estim to his triceps using large muscle atrophy setting at intensity 22 with a functional activity of trying to push blocks away.   Placed saebo stim one on shoulder as he has a subluxation for cyclic unattended estim.  Explained to pt and he nodded in understanding.  Pt tolerated 1 hour of unattended estim.  No adverse affects.  Pt resting in w/c with all needs met. Alarm set and call light in reach.        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), R hemi, avoid hypotension Restrictions Weight Bearing Restrictions: No  Vital Signs:   Pain: Pain Assessment Pain Scale: 0-10 Pain Score: 0-No pain ADL: ADL Eating: Minimal assistance Grooming: Minimal assistance Upper Body Bathing: Moderate assistance Lower Body Bathing: Minimal assistance Where Assessed-Lower Body Bathing: Bed level Upper Body Dressing: Moderate assistance Where Assessed-Upper Body Dressing: Edge of bed Lower Body Dressing: Moderate assistance Where Assessed-Lower Body Dressing: Bed level Toileting: Maximal assistance Toilet Transfer: Maximal assistance   Therapy/Group: Individual Therapy  Alston Berrie 10/02/2023, 1:01 PM

## 2023-10-02 NOTE — Progress Notes (Signed)
Patient ID: [name], male   DOB: Oct 10, 1976, 47 y.o.   MRN: [id number] Met with the patient to review current situation, rehab process, team conference and plan of care. Patient with word finding difficulties and word salad but appears to understand what is heard. And responds with yes/no as possible. Discussed secondary risks including DM (A1C 5.8), HTN, PAD and CKD. Reviewed nutritional supplements for hyponatremia, hypophosphatemia and hypokalemia. Reviewed DAPT for 3 weeks then ASA solo. Confirmed 4 ste right rail w spouse.  Continue to follow along to address educational needs to facilitate preparation for discharge. Pamelia Hoit

## 2023-10-02 NOTE — Progress Notes (Signed)
Speech Language Pathology Daily Session Note  Patient Details  Name: [name] MRN: [id number] Date of Birth: 05/01/1976  Today's Date: 10/02/2023 SLP Individual Time: 0800-0902 SLP Individual Time Calculation (min): 62 min  Short Term Goals: Week 1: SLP Short Term Goal 1 (Week 1): Patient will demonstrate functional problem solving for basic and familair tasks with Mod verbal and visual cues. SLP Short Term Goal 2 (Week 1): Patient will self-monitor and correct phonemic errors at the word level with Min verbal and visual cues. SLP Short Term Goal 3 (Week 1): Patient will follow 2-step commands with 50% accuracy and Mod verbal cues. SLP Short Term Goal 4 (Week 1): Patient will utilize multimodal communication to express basic wants/needs with Mod A multimodal cues. SLP Short Term Goal 5 (Week 1): Patient will name functional items with 50% accuracy and Mod verbal cues.  Skilled Therapeutic Interventions:  Pt was seen in am to address expressive and receptive language deficits. Pt easily alerted upon SLP arrival. He denied pain and was agreeable for session. SLP challenged pt in following commands, use of multimodal communication, and naming. Pt completed confrontational naming task given functional items with 62% acc indep improving to 85% with syllabic cues or carrier phrase. Pt observed with neologisms and perseverations. Pt presented with some unawareness in response to errors. Pt challenged to complete 2 step directions. Pt with significant difficulty following 2 step directions with <10% acc with max A. Task was branched sown to following 1 step directions. Pt completed task with 53% acc with mod A. In additional minutes of session, SLP challenged pt in use of multimodal communication. Given low tech AAC board given FO6, pt identified words with 100% acc with mod A. Throughout session, SLP  challenging pt in answering biographical questions (I.e. name, DOB, age, city, and family member  names). Pt requiring mod to max cues with 50% acc. Pt left in bed with call button within reach and bed alarm active. SLP to continue POC.   Pain Pain Assessment Pain Scale: 0-10 Pain Score: 0-No pain  Therapy/Group: Individual Therapy  Renaee Munda 10/02/2023, 12:40 PM

## 2023-10-02 NOTE — Progress Notes (Signed)
PROGRESS NOTE   Subjective/Complaints:  No events overnight.  No acute complaints.  Patient working with SLP this a.m., continues with word finding difficulties and substitutions, but increasing speed and verbalizations and awareness when he is incorrect. Blood glucose appears appropriate, other labs significant for downtrending creatinine and mildly increasing platelets.   Vitals with  hypertension systolic 140s to 578I, 180s yesterday morning. PVR x 1 last night 127, low.  Last bowel movement 10-5, per wife  ROS: Limited by aphasia.  Pertinent positives above.  Objective:   No results found. Recent Labs    10/01/23 0801 10/02/23 0648  WBC 8.0 7.3  HGB 12.9* 13.0  HCT 40.8 41.3  PLT 409* 477*   Recent Labs    10/01/23 0801 10/02/23 0648  NA 137 135  K 3.8 3.6  CL 106 105  CO2 21* 20*  GLUCOSE 128* 126*  BUN 17 18  CREATININE 2.45* 2.29*  CALCIUM 8.3* 8.4*    Intake/Output Summary (Last 24 hours) at 10/02/2023 0731 Last data filed at 10/02/2023 0725 Gross per 24 hour  Intake 355 ml  Output 2175 ml  Net -1820 ml        Physical Exam: Vital Signs Blood pressure (!) 144/63, pulse 71, temperature 98.5 F (36.9 C), resp. rate 18, SpO2 100%.  Constitutional: No distress . Vital signs reviewed.  Laying in bed, working with SLP. HEENT: NCAT, EOMI, oral membranes moist. + Bilateral gauged ears Cardiovascular: RRR without murmur. No JVD    Respiratory/Chest: CTA Bilaterally without wheezes or rales. Normal effort    GI/Abdomen: BS +, non-tender, non-distended Ext: no clubbing, cyanosis, or edema Psych: flat but cooperative, no endorsed depression or anxiety Skin: Clean and intact without signs of breakdown; right residual limb with overlying callus on end of tibia. Neuro: Oriented to self only, even with options. + Word finding deficits, substitutions, and echolalia.  Some mild dysarthria but this is  improving. + Apraxic and right inattention.  RUE antigravity elbow flexion, elbow extension, grip, and finger abduction with increased time.  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 RLE drift 5 antigravity hip extension, otherwise did not move today, also complicated by motor apraxia. LUE and LLE grossly antigravity and against resistance, 5 out of 5  decreased sense of pain RUE/RLE Musculoskeletal: residual limbs well shaped.     Assessment/Plan: 1. Functional deficits which require 3+ hours per day of interdisciplinary therapy in a comprehensive inpatient rehab setting. Physiatrist is providing close team supervision and 24 hour management of active medical problems listed below. Physiatrist and rehab team continue to assess barriers to discharge/monitor patient progress toward functional and medical goals  Care Tool:  Bathing    Body parts bathed by patient: Chest, Abdomen, Front perineal area, Face   Body parts bathed by helper: Buttocks, Left upper leg, Right upper leg, Left arm, Right arm Body parts n/a: Left lower leg, Right lower leg   Bathing assist Assist Level: Maximal Assistance - Patient 24 - 49%     Upper Body Dressing/Undressing Upper body dressing   What is the patient wearing?: Pull over shirt    Upper body assist Assist Level: Moderate Assistance - Patient  50 - 74%    Lower Body Dressing/Undressing Lower body dressing      What is the patient wearing?: Pants     Lower body assist Assist for lower body dressing: Maximal Assistance - Patient 25 - 49%     Toileting Toileting    Toileting assist Assist for toileting: Maximal Assistance - Patient 25 - 49%     Transfers Chair/bed transfer  Transfers assist  Chair/bed transfer activity did not occur: Safety/medical concerns  Chair/bed transfer assist level: Maximal Assistance - Patient 25 - 49%     Locomotion Ambulation   Ambulation assist   Ambulation activity did not occur:  Safety/medical concerns          Walk 10 feet activity   Assist  Walk 10 feet activity did not occur: Safety/medical concerns        Walk 50 feet activity   Assist Walk 50 feet with 2 turns activity did not occur: Safety/medical concerns         Walk 150 feet activity   Assist Walk 150 feet activity did not occur: Safety/medical concerns         Walk 10 feet on uneven surface  activity   Assist Walk 10 feet on uneven surfaces activity did not occur: Safety/medical concerns         Wheelchair     Assist Is the patient using a wheelchair?: Yes Type of Wheelchair: Manual    Wheelchair assist level: Moderate Assistance - Patient 50 - 74% Max wheelchair distance: 150    Wheelchair 50 feet with 2 turns activity    Assist        Assist Level: Moderate Assistance - Patient 50 - 74%   Wheelchair 150 feet activity     Assist      Assist Level: Moderate Assistance - Patient 50 - 74%   Blood pressure (!) 144/63, pulse 71, temperature 98.5 F (36.9 C), resp. rate 18, SpO2 100%.  Medical Problem List and Plan: 1. 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  Antithrombotics: -DVT/anticoagulation:  Pharmaceutical: Lovenox             -antiplatelet therapy: Aspirin and Plavix for three months followed by aspirin alone (started 9/26)   3. Pain Management: Tylenol as needed   4. Mood/Behavior/Sleep: LCSW to evaluate and provide emotional support             -antipsychotic agents: n/a   5. Neuropsych/cognition: This patient is not quite capable of making decisions on his own  behalf.   -language improving. -may have capacity soon.   6. Skin/Wound Care: Routine skin care checks             -Discussed with nursing good skin check of bilateral residual limbs once in bed at rehab   7. Fluids/Electrolytes/Nutrition: strict Is and Os and follow-up chemistries   8: Hypertension: monitor TID and prn             -  continue hydralazine 50 mg BID             -continue carvedilol 6.25 mg BID             -continue terazosin 2 mg q HS   10/6 continue elevation, particularly in morning. 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 Renal signed off  myfortic stopped in setting of bacteremia per his transplant center-- will need to be restarted at previous dose at d/c               13: GERD: continue PPI BID   14: DM s/p pancreas transplant: CBGs QID; A1c = 5.8% (no home meds listed)             -continue SSI             -consider oral agent             -started carb modified diet    - well controlled, continue current regimen Recent Labs    10/01/23 1615 10/01/23 2051  10/02/23 0602  GLUCAP 134* 99 128*     15: Nausea: resolved -discontinue Reglan -continue PPI -Zofran prn   15: UTI: E. coli isolated: continue ceftriaxone 2 grams q 24 hours (day #4)             -completed abx--observe   16: Urinary retention: purewick in place             10/6-continue PVRs thru today perhaps, in and out cath PRN   -pvr's all lesss than 160cc now, continent  -eob to void when possible  -terazosin now at 4 mg at bedtime   -10-7: Bladder scan volumes 5 this a.m., but is able to void.  Continue timed voids, monitor             17: Hypokalemia: repleted; follow-up BMP 10-7: Potassium 3.6, add 10 meq daily supplementation and recheck BMP in 2 days    LOS: 3 days A FACE TO FACE EVALUATION WAS PERFORMED  Angelina Sheriff 10/02/2023, 7:31 AM

## 2023-10-02 NOTE — Progress Notes (Signed)
Inpatient Rehabilitation Center Individual Statement of Services  Patient Name:  [name]  Date:  10/02/2023  Welcome to the Inpatient Rehabilitation Center.  Our goal is to provide you with an individualized program based on your diagnosis and situation, designed to meet your specific needs.  With this comprehensive rehabilitation program, you will be expected to participate in at least 3 hours of rehabilitation therapies Monday-Friday, with modified therapy programming on the weekends.  Your rehabilitation program will include the following services:  Physical Therapy (PT), Occupational Therapy (OT), Speech Therapy (ST), 24 hour per day rehabilitation nursing, Therapeutic Recreaction (TR), Neuropsychology, Care Coordinator, Rehabilitation Medicine, Nutrition Services, Pharmacy Services, and Other  Weekly team conferences will be held on Wednesdays to discuss your progress.  Your Inpatient Rehabilitation Care Coordinator will talk with you frequently to get your input and to update you on team discussions.  Team conferences with you and your family in attendance may also be held.  Expected length of stay: 18-21 Days  Overall anticipated outcome:  Supervision-Min A  Depending on your progress and recovery, your program may change. Your Inpatient Rehabilitation Care Coordinator will coordinate services and will keep you informed of any changes. Your Inpatient Rehabilitation Care Coordinator's name and contact numbers are listed  below.  The following services may also be recommended but are not provided by the Inpatient Rehabilitation Center:   Home Health Rehabiltiation Services Outpatient Rehabilitation Services    Arrangements will be made to provide these services after discharge if needed.  Arrangements include referral to agencies that provide these services.  Your insurance has been verified to be:  [facility] Medicare Your primary doctor is:  Sanda Linger, MD  Pertinent  information will be shared with your doctor and your insurance company.  Inpatient Rehabilitation Care Coordinator:  Lavera Guise, [place] [phone] or [phone]  Information discussed with and copy given to patient by: Andria Rhein, 10/02/2023, 11:32 AM

## 2023-10-02 NOTE — Progress Notes (Signed)
Inpatient Rehabilitation  Patient information reviewed and entered into eRehab system by Demarrio Menges Kalei Mckillop, OTR/L, Rehab Quality Coordinator.   Information including medical coding, functional ability and quality indicators will be reviewed and updated through discharge.   

## 2023-10-02 NOTE — IPOC Note (Signed)
Overall Plan of Care [facility]) Patient Details Name: [name] MRN: [id number] DOB: 05-Apr-1976  Admitting Diagnosis: Acute CVA (cerebrovascular accident) [facility])  Hospital Problems: Principal Problem:   Acute CVA (cerebrovascular accident) [facility]) Active Problems:   Ischemic cerebrovascular accident (CVA) due to global hypoperfusion with watershed infarction [facility])     Functional Problem List: Nursing Bladder, Bowel, Safety, Endurance, Medication Management, Nutrition  PT Balance, Endurance, Motor, Perception, Safety, Sensory  OT Balance, Cognition, Endurance, Motor, Perception, Safety  SLP Cognition, Linguistic  TR         Basic ADL's: OT Eating, Bathing, Grooming, Dressing, Toileting     Advanced  ADL's: OT       Transfers: PT Bed Mobility, Bed to Chair, Car, Occupational psychologist, Research scientist (life sciences): PT Ambulation, Psychologist, prison and probation services, Stairs     Additional Impairments: OT Fuctional Use of Upper Extremity  SLP Communication, Social Cognition comprehension, expression Problem Solving, Awareness  TR      Anticipated Outcomes Item Anticipated Outcome  Self Feeding Supervision  Swallowing      Basic self-care  Min A  Toileting  Min A   Bathroom Transfers Min A  Bowel/Bladder  manage bowel and bladder w mod I assist  Transfers  CGA  Locomotion  min assist ambulatory  Communication  Min A  Cognition  Supervision  Pain  n/a  Safety/Judgment  manage w cues   Therapy Plan: PT Intensity: Minimum of 1-2 x/day ,45 to 90 minutes PT Frequency: 5 out of 7 days PT Duration Estimated Length of Stay: 3 weeks OT Intensity: Minimum of 1-2 x/day, 45 to 90 minutes OT Frequency: 5 out of 7 days OT Duration/Estimated Length of Stay: 3 weeks SLP Intensity: Minumum of 1-2 x/day, 30 to 90 minutes SLP Frequency: 3 to 5 out of 7 days SLP Duration/Estimated Length of Stay: 3 weeks   Team Interventions: Nursing Interventions Patient/Family Education,  Bladder Management, Medication Management, Discharge Planning, Bowel Management, Disease Management/Prevention, Cognitive Remediation/Compensation  PT interventions Ambulation/gait training, Functional electrical stimulation, Splinting/orthotics, Wheelchair propulsion/positioning, Warden/ranger, Functional mobility training, Stair training, Cognitive remediation/compensation, Neuromuscular re-education, Therapeutic Activities, Community reintegration, Pain management, Therapeutic Exercise, Discharge planning, Patient/family education, UE/LE Strength taining/ROM, Psychosocial support, UE/LE Coordination activities, Disease management/prevention, DME/adaptive equipment instruction, Visual/perceptual remediation/compensation  OT Interventions Cognitive remediation/compensation, Warden/ranger, Community reintegration, Discharge planning, Disease mangement/prevention, DME/adaptive equipment instruction, Functional electrical stimulation, Functional mobility training, Neuromuscular re-education, Pain management, Patient/family education, Psychosocial support, Self Care/advanced ADL retraining, Skin care/wound managment, Splinting/orthotics, Therapeutic Activities, Therapeutic Exercise, UE/LE Strength taining/ROM, UE/LE Coordination activities, Visual/perceptual remediation/compensation, Wheelchair propulsion/positioning  SLP Interventions Cognitive remediation/compensation, Financial trader, Environmental controls, Functional tasks, Internal/external aids, Multimodal communication approach, Patient/family education, Therapeutic Activities  TR Interventions    SW/CM Interventions Discharge Planning, Psychosocial Support, Patient/Family Education, Disease Management/Prevention   Barriers to Discharge MD  Medical stability, Home enviroment access/loayout, Lack of/limited family support, and Insurance for SNF coverage  Nursing Decreased caregiver support, Home environment access/layout 2  level 4 ste right rail main B+B w spouse  PT Inaccessible home environment 5 STE, full bed/bath on 2nd floor  OT      SLP      SW Insurance for SNF coverage, Decreased caregiver support, Lack of/limited family support     Team Discharge Planning: Destination: PT-Home ,OT- Home , SLP-Home Projected Follow-up: PT-Outpatient PT, OT-  Home health OT, SLP-Outpatient SLP Projected Equipment Needs: PT-To be determined, OT- To be determined, SLP-None recommended by SLP Equipment Details: PT- , OT-  Patient/family involved in discharge planning: PT- Family member/caregiver, Patient,  OT-Patient, SLP-Patient  MD ELOS: 18-21 days Medical Rehab Prognosis:  Good Assessment: The patient has been admitted for CIR therapies with the diagnosis of CVA . The team will be addressing functional mobility, strength, stamina, balance, safety, adaptive techniques and equipment, self-care, bowel and bladder mgt, patient and caregiver education,. Goals have been set at [facility] A PT/OT, supervision SLP. Anticipated discharge destination is home.       See Team Conference Notes for weekly updates to the plan of care

## 2023-10-02 NOTE — Discharge Summary (Signed)
Physician Discharge Summary  Patient ID: [name] MRN: [id number] DOB/AGE: Mar 27, 1976 47 y.o.  Admit date: 09/29/2023 Discharge date: 10/19/2023  Discharge Diagnoses:  Principal Problem:   Acute CVA (cerebrovascular accident) [facility]) Active Problems:   Ischemic cerebrovascular accident (CVA) due to global hypoperfusion with watershed infarction [facility])   Amnestic MCI (mild cognitive impairment with memory loss) History of hypertension History of hyperlipidemia Chronic immunosuppressive therapy History of peripheral arterial disease status post bilateral BKA Gastroesophageal reflux disease Diabetes mellitus Urinary tract infection secondary to E. coli Urinary retention Hypokalemia  Discharged Condition: stable  Significant Diagnostic Studies:  ADDENDUM REPORT: 10/16/2023 18:57   ADDENDUM: There is a moderate stenosis of the proximal petrous segment of the left ICA and severe stenosis of the distal cavernous segment. An area of decreased flow related enhancement noted just proximal to the carotid terminus. This may be exaggerated by nearby susceptibility effects, but likely reflects at least moderate stenosis.     Electronically Signed   By: Deatra Robinson M.D.   On: 10/16/2023 18:57    ADDENDUM REPORT: 10/16/2023 18:57   ADDENDUM: There is a moderate stenosis of the proximal petrous segment of the left ICA and severe stenosis of the distal cavernous segment. An area of decreased flow related enhancement noted just proximal to the carotid terminus. This may be exaggerated by nearby susceptibility effects, but likely reflects at least moderate stenosis.     Electronically Signed   By: Deatra Robinson M.D.   On: 10/16/2023 18:57    Narrative & Impression  CLINICAL DATA:  Stroke, hemorrhagic   EXAM: CT HEAD WITHOUT CONTRAST   TECHNIQUE: Contiguous axial images were obtained from the base of the skull through the vertex without intravenous  contrast.   RADIATION DOSE REDUCTION: This exam was performed according to the departmental dose-optimization program which includes automated exposure control, adjustment of the mA and/or kV according to patient size and/or use of iterative reconstruction technique.   COMPARISON:  Brain MR 10/08/23, CT head 10/08/23   FINDINGS: Brain: Evolving left MCA territory infarcts involving the left frontal and parietal lobes, better seen on recent prior brain MRI. Redemonstrated petechial hemorrhage along the posterior left frontal lobe infarct (series 3, image 30). No hydrocephalus. No extra-axial fluid collection. No CT evidence of a new cortical infarct. Mass effect. No mass lesion.   Vascular: No hyperdense vessel or unexpected calcification.   Skull: Normal. Negative for fracture or focal lesion.   Sinuses/Orbits: Trace right mastoid effusion. Trace mucosal thickening in the bilateral maxillary sinuses. Bilateral lens replacement. Orbits are otherwise unremarkable.   Other: None.   IMPRESSION: Evolving left MCA territory infarcts involving the left frontal and parietal lobes, better seen on recent prior brain MRI. Redemonstrated petechial hemorrhage along the posterior left frontal lobe.     Electronically Signed   By: Lorenza Cambridge M.D.   On: 10/11/2023 09:56      Narrative & Impression  CLINICAL DATA:  47 year old male with scattered small left hemisphere infarcts on brain MRI last month, left MCA infarct petechial hemorrhage on head CT yesterday after fall. Subsequent encounter.   EXAM: CT HEAD WITHOUT CONTRAST   TECHNIQUE: Contiguous axial images were obtained from the base of the skull through the vertex without intravenous contrast.   RADIATION DOSE REDUCTION: This exam was performed according to the departmental dose-optimization program which includes automated exposure control, adjustment of the mA and/or kV according to patient size and/or use of  iterative reconstruction technique.   COMPARISON:  Head CT 2144 hours yesterday   FINDINGS: Brain: Stable small roughly 9 mm petechial hemorrhage in the left anterior frontal lobe series 3, image 18, with superimposed scattered left MCA territory cytotoxic edema most pronounced overlying that area and in the posterior left frontal lobe, perirolandic area (series 3, image 25). No intracranial mass effect or midline shift. No new intracranial hemorrhage. Unchanged nondilated ventricles. Small cavum septum pellucidum. Stable gray-white differentiation elsewhere, normal. Normal basilar cisterns, negative posterior fossa.   Vascular: Calcified atherosclerosis at the skull base. No suspicious intracranial vascular hyperdensity.   Skull: No fracture identified.   Sinuses/Orbits: Visualized paranasal sinuses and mastoids are stable and well aerated.   Other: Calcified scalp vessel atherosclerosis. No orbit or scalp soft tissue injury identified.   IMPRESSION: 1. Stable evolving, scattered Left MCA territory infarcts with petechial hemorrhage - possibly subacute and extended from the ischemia demonstrated by MRI last month. No malignant hemorrhagic transformation or intracranial mass effect.   2. No new intracranial abnormality. Advanced calcified atherosclerosis.     Electronically Signed   By: Odessa Fleming M.D.   On: 10/08/2023 06:11        Narrative & Impression  CLINICAL DATA:  Initial evaluation for acute trauma, fall.   EXAM: CT HEAD WITHOUT CONTRAST   TECHNIQUE: Contiguous axial images were obtained from the base of the skull through the vertex without intravenous contrast.   RADIATION DOSE REDUCTION: This exam was performed according to the departmental dose-optimization program which includes automated exposure control, adjustment of the mA and/or kV according to patient size and/or use of iterative reconstruction technique.   COMPARISON:  Prior study from  09/18/2023.   FINDINGS: Brain: Cerebral volume within normal limits. Patchy hypodensity involving the left frontal and parietal lobes, consistent with an evolving acute ischemic left MCA distribution infarct. Superimposed small volume hemorrhage at the level of the left frontal operculum measuring 1 cm (series 2, image 18). No significant mass effect.   No other acute large vessel territory infarct or intracranial hemorrhage. No mass lesion or midline shift. No hydrocephalus or extra-axial fluid collection.   Vascular: No abnormal hyperdense vessel.   Skull: Scalp soft tissues within normal limits.  Calvarium intact.   Sinuses/Orbits: Globes and orbital soft tissues within normal limits. Paranasal sinuses and mastoid air cells are clear.   Other: None.   IMPRESSION: Evidence for new evolving acute left MCA territory infarct involving the left frontal and parietal lobes. Superimposed patchy small volume hemorrhage, most pronounced at the left frontal operculum. No significant mass effect.   Critical Value/emergent results were called by telephone at the time of interpretation on 10/07/2023 at 9:59 pm to provider [address] , who verbally acknowledged these results.     Electronically Signed   By: Rise Mu M.D.   On: 10/07/2023 22:10    Labs:  Basic Metabolic Panel:    Latest Ref Rng & Units 10/16/2023    5:40 AM 10/15/2023    7:13 AM 10/14/2023    7:22 AM  BMP  Glucose 70 - 99 mg/dL 147  829    BUN 6 - 20 mg/dL 37  35    Creatinine 5.62 - 1.24 mg/dL 1.30  8.65    Sodium 784 - 145 mmol/L 137  138    Potassium 3.5 - 5.1 mmol/L 4.3  4.5  4.9   Chloride 98 - 111 mmol/L 107  110    CO2 22 - 32 mmol/L 19  20    Calcium 8.9 -  10.3 mg/dL 9.5  8.9        CBC:    Latest Ref Rng & Units 10/16/2023    5:40 AM 10/09/2023    5:30 AM 10/08/2023    9:12 AM  CBC  WBC 4.0 - 10.5 K/uL 7.4  7.3  7.7   Hemoglobin 13.0 - 17.0 g/dL 16.1  09.6  04.5    Hematocrit 39.0 - 52.0 % 43.2  40.4  39.9   Platelets 150 - 400 K/uL 263  461  495     Brief HPI:   [name] is a 47 y.o. 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 Patient was at his infusion center today getting his Belatacept infusion for chronic immunosuppressive therapy and during his infusion he became hypotensive and had full body burning sensation. Patient did receive Benadryl at the infusion center and patient was transported to the [facility] for further evaluation and management. On initial arrival to the emergency department patient was altered, and had blood pressure of 99/86. Patient was altered, and minimally responsive. Patient was given IM epinephrine with mild improvement in mental status. Patient was mumbling and nonsensical, and therefore with concern for not protecting his own airway, patient was emergently intubated. Patient subsequently had code stroke called. Patient was admitted to the ICU for further evaluation management of altered mental status. Treated for E. coli UTI/bacteremia. Continues to have right hemiparesis with significant right arm weakness and mild right leg weakness. He is speech is improving but remains nonfluent but able to speak sentences and follows commands well. 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 Extubated 9/26. Nausea treated with scheduled Reglan. AKI atop CKD II-IIIa. Nephrology consulted 9/30. Immunosuppressant drug regimen reviewed/adjusted. Good UOP and no significant PVRs. Tolerating diet. Patient requiring mod A bed mobility then mod x 2 to stand to STEDY then min A of 2 for safety. Participating with multiple STS and standing weight shifting with therapist assist R UE and R  knee ext. Decreased awareness of R sided deficits    Hospital Course: [name] was admitted to rehab 09/29/2023 for inpatient therapies to consist of PT, ST and OT at least three hours five days a week. Past admission physiatrist, therapy team and rehab RN have worked together to provide customized collaborative inpatient rehab. Right WHO ordered. Follow-up labs with serum Cr up to 2.40 on 10/05>>Nephrology follow up. 10/7creatinine continues to gradually downtrending to 2.29 from 2.4 prior days, encourage fluid and may need IV fluid if no improvement in 1 to 2 days. Myfortic stopped in setting of bacteremia per his transplant center-- will need to be restarted at previous dose at d/c.  Renal signed off. Continued timed voids. IVFs started on 10/09. Neuropsychiatry consultation 10/11. Patient fell from [facility] onto floor on 10/12 and CT head obtained. Evaluated by Dr. Iver Nestle. CT head revealed new CVA with tiny hemorrhage. Plavix held until repeat CT. EEG and MRI brain performed. 10/13: Repeat CTH: stable, hemorrhage appears subacute. 10/14: MRI showed subacute infarct with petechial hemorrhage and MRA showed left ICA now recannalized.  ASA 81mg  resumed per neuro recs , recheck CT head on 10/16 to see if Plavix can be resumed.  SQ heparin for DVT prophylaxis started. Reviewed CT head left frontal 1cm bleed less conspicous, discussed with Neuro,  ok to resume Plavix 10/16. Nephrology re-consulted 10/16 for increased creatinine. IV hydration given. New baseline creatinine ~2.2. Safe to restart Myfortic per Dr. Valentino Nose. Hypokalemia resolved with daily supplementation of 10 mEq daily.    Blood pressures were monitored on TID basis and hydralazine 50 mg twice daily, carvedilol 6.25 mg twice daily and terazosin 2 mg nightly continued. Terazosin increased to 4 mg at bedtime. Increased hydralazine to 50 mg TID on 10/07. Increased carvedilol to 12.5 mg BID on 10/21.  Diabetes has been monitored with ac/hs CBG  checks and SSI was use prn for tighter BS control. SSI discontinued. Refused carb modified diet.  Rehab course: During patient's stay in rehab weekly team conferences were held to monitor patient's progress, set goals and discuss barriers to discharge. At admission, patient required  max with mobility and min with basic self-care skills.   He has had improvement in activity tolerance, balance, postural control as well as ability to compensate for deficits. He has had improvement in functional use RUE and RLE as well as improvement in awareness. Patient has met 6 of 10 long term goals due to improved activity tolerance, improved balance, improved postural control, increased strength, ability to compensate for deficits, and improved attention.  Patient to discharge at a wheelchair level Supervision.   Patient's care partner is independent to provide the necessary physical and cognitive assistance at discharge.   Discharge disposition: 06-Home-Health Care Svc     Diet: renal  Special Instructions: No driving, alcohol consumption or tobacco use.  Aspirin and Plavix for three months followed by aspirin alone (started 9/26)   30-35 minutes were spent on discharge planning and discharge summary.  Discharge Instructions     Ambulatory referral to Neurology   Complete by: As directed    Follow up with stroke clinic NP (Jessica Vanschaick or Darrol Angel, if both not available, consider Manson Allan, or Ahern) at [facility] in about 4 weeks. Thanks.   Ambulatory referral to Physical Medicine Rehab   Complete by: As directed    Discharge patient   Complete by: As directed    Discharge disposition: 06-Home-Health Care Svc   Discharge patient date: 10/19/2023      Allergies as of 10/19/2023       Reactions   Belatacept Anaphylaxis   hypotensive with altered reaction and required epinephrine.  Needed to be intubated for airway protection.   Amlodipine Rash   Lisinopril Rash         Medication List     STOP taking these medications    cefTRIAXone 2 g in sodium chloride 0.9 % 100 mL   chlorproMAZINE 10 MG tablet Commonly known as: THORAZINE   ergocalciferol 1.25 MG (50000 UT) capsule Commonly known as: VITAMIN D2   fluocinonide cream 0.05 % Commonly known as: LIDEX   insulin aspart 100 UNIT/ML injection Commonly known as: novoLOG   meclizine 25 MG tablet Commonly known as: ANTIVERT   metoCLOPramide 10 MG tablet Commonly known as: REGLAN       TAKE these medications    acetaminophen 325 MG tablet Commonly known as: TYLENOL Take 1-2 tablets (325-650 mg total) by mouth every 4 (four) hours as needed for mild pain (pain score 1-3).   aspirin EC 81 MG tablet Take 1 tablet (81 mg total) by mouth daily. Swallow whole.   carvedilol 12.5 MG tablet Commonly known as: COREG Take 1 tablet (12.5 mg total) by mouth 2 (two) times daily with a meal. What changed:  medication strength  how much to take   clopidogrel 75 MG tablet Commonly known as: PLAVIX Take 1 tablet (75 mg total) by mouth daily.   cycloSPORINE modified 25 MG capsule Commonly known as: GENGRAF Take 4 capsules (100 mg total) by mouth 2 (two) times daily.   hydrALAZINE 50 MG tablet Commonly known as: APRESOLINE Take 1 tablet (50 mg total) by mouth 2 (two) times daily.   melatonin 5 MG Tabs Take 1 tablet (5 mg total) by mouth at bedtime as needed. What changed:  medication strength how much to take   methocarbamol 500 MG tablet Commonly known as: ROBAXIN Take 1 tablet (500 mg total) by mouth every 6 (six) hours as needed for muscle spasms.   mycophenolate 180 MG EC tablet Commonly known as: MYFORTIC Take 1 tablet (180 mg total) by mouth 2 (two) times daily.   pantoprazole 40 MG tablet Commonly known as: PROTONIX Take 1 tablet (40 mg total) by mouth 2 (two) times daily.   polyethylene glycol 17 g packet Commonly known as: MIRALAX / GLYCOLAX Take 17 g by mouth daily as  needed for moderate constipation.   potassium chloride 10 MEQ tablet Commonly known as: KLOR-CON M Take 1 tablet (10 mEq total) by mouth daily.   predniSONE 5 MG tablet Commonly known as: DELTASONE Take 5 mg by mouth daily.   rosuvastatin 20 MG tablet Commonly known as: CRESTOR Take 1 tablet (20 mg total) by mouth daily.   Senna-S 8.6-50 MG tablet Generic drug: senna-docusate Take 1 tablet by mouth at bedtime.   sulfamethoxazole-trimethoprim 400-80 MG tablet Commonly known as: BACTRIM Take 1 tablet by mouth every Monday, Wednesday, and Friday.   terazosin 2 MG capsule Commonly known as: HYTRIN Take 2 capsules (4 mg total) by mouth at bedtime.        Follow-up Information     [name], MD Follow up.   Specialty: Internal Medicine Why: Call the office in 1-2 days to make arrangements for hospital follow-up appointment. Contact information: [address] [place] [address] [phone]         Erick Colace, MD Follow up.   Specialty: Physical Medicine and Rehabilitation Why: office will call you to arrange your appt (sent). Follow-up in ~2 weeks. Contact information: [address] [place] [address] [phone]         [facility]. Schedule an appointment as soon as possible for a visit in 1 month(s).   Why: stroke clinic Contact information: [address]     [address] [place] [address] [phone]        [facility] Follow up.   Specialty: Nephrology Contact information: [address] [place] [address] [phone]                 Signed: Milinda Antis 10/23/2023, 12:25 PM

## 2023-10-03 DIAGNOSIS — Z89512 Acquired absence of left leg below knee: Secondary | ICD-10-CM | POA: Diagnosis not present

## 2023-10-03 DIAGNOSIS — I639 Cerebral infarction, unspecified: Secondary | ICD-10-CM | POA: Diagnosis not present

## 2023-10-03 DIAGNOSIS — N1832 Chronic kidney disease, stage 3b: Secondary | ICD-10-CM | POA: Diagnosis not present

## 2023-10-03 DIAGNOSIS — I10 Essential (primary) hypertension: Secondary | ICD-10-CM | POA: Diagnosis not present

## 2023-10-03 DIAGNOSIS — I69359 Hemiplegia and hemiparesis following cerebral infarction affecting unspecified side: Secondary | ICD-10-CM

## 2023-10-03 DIAGNOSIS — Z89511 Acquired absence of right leg below knee: Secondary | ICD-10-CM

## 2023-10-03 LAB — GLUCOSE, CAPILLARY
Glucose-Capillary: 125 mg/dL — ABNORMAL HIGH (ref 70–99)
Glucose-Capillary: 115 mg/dL — ABNORMAL HIGH (ref 70–99)
Glucose-Capillary: 149 mg/dL — ABNORMAL HIGH (ref 70–99)
Glucose-Capillary: 143 mg/dL — ABNORMAL HIGH (ref 70–99)

## 2023-10-03 NOTE — Progress Notes (Signed)
Physical Therapy Session Note  Patient Details  Name: [name] MRN: [id number] Date of Birth: February 05, 1976  Today's Date: 10/03/2023 PT Individual Time: 2952-8413 PT Individual Time Calculation (min): 74 min   Short Term Goals: Week 1:  PT Short Term Goal 1 (Week 1): Pt will complete transfer min assist x1 PT Short Term Goal 2 (Week 1): Pt will complete bed mobility with min assist PT Short Term Goal 3 (Week 1): Pt will complete WC mobility with supervision 150' PT Short Term Goal 4 (Week 1): Pt will initiate gait training  Skilled Therapeutic Interventions/Progress Updates:  Pt supine in bed and asleep on entrance to room. Easily roused and then alert and agreeable to session.  No pain complaint at start of session.   Assisted pt with supine > sit and requires MaxA without strength to push trunk up from bed surface. With vc for pushing at bed, pt reaches around therapist and pulls to upright seated position. Bil prosthetics donned with Min/ ModA d/t reduced use of RUE.   Squat pivot to L into w/c with Mod/ MaxA following instructions provided prior to attempt for increased focus.   Performs sit<>stand at hallway handrail with impulsivity and requires MinA for rise to stand, but then MaxA for maintaining balance. Unable to advance RLE and requires HR for steady UE support. From therapist requires MaxA for RLE advancement, foot positioning, block during majority of stance phase with minimal quad activation noted at start of amb bout and then disappearing with fatigue during bout. Weight shift facilitated laterally at hips and forward hip translation during R stance phase.   Ambulates 42' x1 then 32' x1 at handrail followed byadjustment of LUE to [facility] to complete up to 59'  with MaxA and add'l cueing for sequencing of HW requiring less vc throughout.   Squat pivot to mat table to L side with ModA +2 for safety. Sits while maintaining good static seated balance. Sit<>stand practice  with +2 and no AD from mat table. Min quad activtation noted to RLE requiring MaxA for full upright stance. LUE support to +2. Guided into midline. With vc, pt is able to shift weight with direct cue but unable to correct with pt identification of R sided lean. MaxA provided to hold RLE into full extension and then pt able to let go of LUE support and maintain stance for > . return to sit with MinA and good BLE motor control. Requires momentum build and Min/ modA +2 for power up and then can complete rise to stand.   At end of session, pt left upright in w/c with RUE propped on pillow on  lap tray. Alarm belt donned and all needs in reach. Provided with Ginger Ale on request.   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   Therapy/Group: Individual Therapy  Loel Dubonnet PT, DPT, CSRS 10/03/2023, 6:12 PM

## 2023-10-03 NOTE — Progress Notes (Signed)
Patient resting in bed with his head covered with his linen and does not verbally respond when his name is call or attempt sto rermove linen. Refuse medication this morning. Informed one coming rN.

## 2023-10-03 NOTE — Progress Notes (Signed)
PROGRESS NOTE   Subjective/Complaints:  aphasic  ROS: Limited by aphasia.  Pertinent positives above.  Objective:   No results found. Recent Labs    10/01/23 0801 10/02/23 0648  WBC 8.0 7.3  HGB 12.9* 13.0  HCT 40.8 41.3  PLT 409* 477*   Recent Labs    10/01/23 0801 10/02/23 0648  NA 137 135  K 3.8 3.6  CL 106 105  CO2 21* 20*  GLUCOSE 128* 126*  BUN 17 18  CREATININE 2.45* 2.29*  CALCIUM 8.3* 8.4*    Intake/Output Summary (Last 24 hours) at 10/03/2023 0819 Last data filed at 10/02/2023 2200 Gross per 24 hour  Intake 570 ml  Output 575 ml  Net -5 ml        Physical Exam: Vital Signs Blood pressure (!) 162/71, pulse 70, temperature 98.2 F (36.8 C), resp. rate 18, height 5\' 5"  (1.651 m), weight 79.8 kg, SpO2 96%.   General: No acute distress Mood and affect are appropriate Heart: Regular rate and rhythm no rubs murmurs or extra sounds Lungs: Clear to auscultation, breathing unlabored, no rales or wheezes Abdomen: Positive bowel sounds, soft nontender to palpation, nondistended Extremities: No clubbing, cyanosis, or edema Skin: No evidence of breakdown, no evidence of rash   + Word finding deficits, substitutions, and echolalia.  Some mild dysarthria but this is improving. + Apraxic and right inattention.  RUE antigravity elbow flexion, elbow extension, grip, and finger abduction with increased time.  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 RLE otherwise did not move today, also complicated by motor apraxia. LUE and LLE grossly antigravity and against resistance, 5 out of 5  decreased sense of pain RUE/RLE Musculoskeletal: residual limbs well shaped.  Bilateral BKA   Assessment/Plan: 1. Functional deficits which require 3+ hours per day of interdisciplinary therapy in a comprehensive inpatient rehab setting. Physiatrist is providing close team supervision and 24 hour management of  active medical problems listed below. Physiatrist and rehab team continue to assess barriers to discharge/monitor patient progress toward functional and medical goals  Care Tool:  Bathing    Body parts bathed by patient: Chest, Abdomen, Front perineal area, Face   Body parts bathed by helper: Buttocks, Left upper leg, Right upper leg, Left arm, Right arm Body parts n/a: Left lower leg, Right lower leg   Bathing assist Assist Level: Maximal Assistance - Patient 24 - 49%     Upper Body Dressing/Undressing Upper body dressing   What is the patient wearing?: Pull over shirt    Upper body assist Assist Level: Moderate Assistance - Patient 50 - 74%    Lower Body Dressing/Undressing Lower body dressing      What is the patient wearing?: Pants     Lower body assist Assist for lower body dressing: Maximal Assistance - Patient 25 - 49%     Toileting Toileting    Toileting assist Assist for toileting: Maximal Assistance - Patient 25 - 49%     Transfers Chair/bed transfer  Transfers assist  Chair/bed transfer activity did not occur: Safety/medical concerns  Chair/bed transfer assist level: Maximal Assistance - Patient 25 - 49%     Locomotion Ambulation  Ambulation assist   Ambulation activity did not occur: Safety/medical concerns          Walk 10 feet activity   Assist  Walk 10 feet activity did not occur: Safety/medical concerns        Walk 50 feet activity   Assist Walk 50 feet with 2 turns activity did not occur: Safety/medical concerns         Walk 150 feet activity   Assist Walk 150 feet activity did not occur: Safety/medical concerns         Walk 10 feet on uneven surface  activity   Assist Walk 10 feet on uneven surfaces activity did not occur: Safety/medical concerns         Wheelchair     Assist Is the patient using a wheelchair?: Yes Type of Wheelchair: Manual    Wheelchair assist level: Moderate Assistance -  Patient 50 - 74% Max wheelchair distance: 150    Wheelchair 50 feet with 2 turns activity    Assist        Assist Level: Moderate Assistance - Patient 50 - 74%   Wheelchair 150 feet activity     Assist      Assist Level: Moderate Assistance - Patient 50 - 74%   Blood pressure (!) 162/71, pulse 70, temperature 98.2 F (36.8 C), resp. rate 18, height 5\' 5"  (1.651 m), weight 79.8 kg, SpO2 96%.  Medical Problem List and Plan: 1. 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  Antithrombotics: -DVT/anticoagulation:  Pharmaceutical: Lovenox             -antiplatelet therapy: Aspirin and Plavix for three months followed by aspirin alone (started 9/26)   3. Pain Management: Tylenol as needed   4. Mood/Behavior/Sleep: LCSW to evaluate and provide emotional support             -antipsychotic agents: n/a   5. Neuropsych/cognition: This patient is not quite capable of making decisions on his own behalf.   -language improving. -may have capacity soon.   6. Skin/Wound Care: Routine skin care checks             -Discussed with nursing good skin check of bilateral residual limbs once in bed at rehab   7. Fluids/Electrolytes/Nutrition: strict Is and Os and follow-up chemistries   8: Hypertension: monitor TID and prn             -continue hydralazine 50 mg BID             -continue carvedilol 6.25 mg BID             -continue terazosin 2 mg q HS   10/6 continue elevation, particularly in morning. 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 Renal signed off  myfortic stopped in setting of bacteremia per his transplant center-- will need to be restarted at previous dose at d/c               13: GERD: continue PPI BID   14: DM s/p pancreas transplant: CBGs QID; A1c = 5.8% (no home meds listed)             -continue SSI             -consider oral agent             -started carb modified diet    - well controlled, continue current regimen 10/8 Recent Labs    10/02/23 1640 10/02/23 2029 10/03/23 0626  GLUCAP 144* 130* 125*     15: Nausea: resolved -discontinue Reglan -continue PPI -Zofran prn   15: UTI: E. coli isolated: continue ceftriaxone 2 grams q 24 hours (day #4)             -completed abx--observe   16: Urinary retention: purewick in place             10/6-continue PVRs thru today perhaps, in and out cath PRN   -pvr's all lesss than 160cc now, continent  -eob to void when possible  -terazosin now at 4 mg at bedtime   -             17: Hypokalemia: repleted; follow-up BMP 10-8 resolved with  daily supplementation    18.  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

## 2023-10-03 NOTE — Progress Notes (Signed)
Occupational Therapy Session Note  Patient Details  Name: [name] MRN: [id number] Date of Birth: 08/28/1976  Today's Date: 10/03/2023 OT Individual Time: 5621-3086 OT Individual Time Calculation (min): 71 min    Short Term Goals: Week 1:  OT Short Term Goal 1 (Week 1): Patient will complete toilet transfer with mod A of 1 OT Short Term Goal 2 (Week 1): Patient will recall hemi-dressing techniques with min cues OT Short Term Goal 3 (Week 1): Patient will complete 1 step of LB dressing task  Skilled Therapeutic Interventions/Progress Updates:  Pt greeted supine in bed, pt agreeable to OT intervention.      Transfers/bed mobility:  Pt initially completed squat pivot to L side with MAX A from EOB>w/c but worked on squat pivots in the gym with pt progressing to light MIN A to complete squat pivots to R and L side.   ADLs:  Grooming: pt washed face from bed level with set- up assist with LUE.  UB dressing:donned OH shirt from EOB with MAX A, pt mildly impulsive donning shirt on his own despite cues for hemitechnique.  LB dressing: donned pants and underwear from bed level with MAX A, pt mildly impulsive donning pants on his own despite cues for hemitechnique.  Footwear: total A to don bilateral prosthetics from EOB  Bathing: pt completed bathing from bed level with MAX A, emphasis on forced use of RUE, using RUE as gross assist  and handover hand assist for bathing tasks.    NMR:pt completed lateral leans form EOM onto LUE to promote increased weightbearing into LUE for neuromuscular reeducation. Pt required up to MAX A at times for sitting balnace d/t posterior lean, provided tactile cue of having pt keep R elbow on yoga block during leans.worked on word finding with pt instructed to reach for specific colored cone to increase cognitive demand and challenge dynamic sitting balance.    Estim:  1:1 NMES applied to L wrist extensors at the below settings:    Ratio 1:3 Rate 35  pps Waveform- Asymmetric Ramp 1.0 Pulse 300 Intensity- 19  Duration -   10 mins No pain or adverse reactions noted at end of treatment  Ended session with pt supine in bed with all needs within reach and bed alarm activated.                    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

## 2023-10-03 NOTE — Progress Notes (Addendum)
Speech Language Pathology Daily Session Note  Patient Details  Name: [name] MRN: [id number] Date of Birth: 20-Jun-1976  Today's Date: 10/03/2023 SLP Individual Time: 1300-1400 SLP Individual Time Calculation (min): 60 min  Short Term Goals: Week 1: SLP Short Term Goal 1 (Week 1): Patient will demonstrate functional problem solving for basic and familair tasks with Mod verbal and visual cues. SLP Short Term Goal 2 (Week 1): Patient will self-monitor and correct phonemic errors at the word level with Min verbal and visual cues. SLP Short Term Goal 3 (Week 1): Patient will follow 2-step commands with 50% accuracy and Mod verbal cues. SLP Short Term Goal 4 (Week 1): Patient will utilize multimodal communication to express basic wants/needs with Mod A multimodal cues. SLP Short Term Goal 5 (Week 1): Patient will name functional items with 50% accuracy and Mod verbal cues.  Skilled Therapeutic Interventions: Skilled therapy session focused on communicative goals. SLP facilitated session by providing modA during  confrontational naming task. Patient able to name functional items with 50% accuracy independently, though accuracy increased with phonemic and written cuing. Patient able to utilize low tech AAC board effectively, though requested addressing verbal communication. SLP targeted automatic speech tasks through days of the week, months of the year, birthday, and name. Patient able to verbalize all automatic speech tasks when provided max written A, though once removed noted increased perseveration and neologisms (only providing initial letter). Of note, pacing board aided in phonemic paraphasias and subsequent intelligibility. SLP left a note in room for wife to write names of family members to address in ST. Patient left in bed with alarm set and call bell in reach. Continue POC.    Pain No pain reported   Therapy/Group: Individual Therapy  Tabitha Riggins M.A., CF-SLP 10/03/2023,  2:34 PM

## 2023-10-04 DIAGNOSIS — I639 Cerebral infarction, unspecified: Secondary | ICD-10-CM | POA: Diagnosis not present

## 2023-10-04 DIAGNOSIS — Z89512 Acquired absence of left leg below knee: Secondary | ICD-10-CM | POA: Diagnosis not present

## 2023-10-04 DIAGNOSIS — N1832 Chronic kidney disease, stage 3b: Secondary | ICD-10-CM | POA: Diagnosis not present

## 2023-10-04 DIAGNOSIS — I10 Essential (primary) hypertension: Secondary | ICD-10-CM | POA: Diagnosis not present

## 2023-10-04 LAB — BASIC METABOLIC PANEL
Anion gap: 7 (ref 5–15)
BUN: 33 mg/dL — ABNORMAL HIGH (ref 6–20)
CO2: 24 mmol/L (ref 22–32)
Calcium: 8.6 mg/dL — ABNORMAL LOW (ref 8.9–10.3)
Chloride: 106 mmol/L (ref 98–111)
Creatinine, Ser: 2.47 mg/dL — ABNORMAL HIGH (ref 0.61–1.24)
GFR, Estimated: 31 mL/min — ABNORMAL LOW (ref >60)
Glucose, Bld: 119 mg/dL — ABNORMAL HIGH (ref 70–99)
Potassium: 4.3 mmol/L (ref 3.5–5.1)
Sodium: 137 mmol/L (ref 135–145)

## 2023-10-04 LAB — CULTURE, BLOOD (ROUTINE X 2)
Culture: NO GROWTH
Culture: NO GROWTH
Special Requests: ADEQUATE
Special Requests: ADEQUATE

## 2023-10-04 LAB — GLUCOSE, CAPILLARY: Glucose-Capillary: 117 mg/dL — ABNORMAL HIGH (ref 70–99)

## 2023-10-04 MED ORDER — SODIUM CHLORIDE 0.9% FLUSH
3.0000 mL | Freq: Two times a day (BID) | INTRAVENOUS | Status: DC
  Administered 2023-10-04 – 2023-10-14 (×7): 3 mL via INTRAVENOUS

## 2023-10-04 MED ORDER — SODIUM CHLORIDE 0.45 % IV SOLN: INTRAVENOUS | Status: AC

## 2023-10-04 MED ORDER — SODIUM CHLORIDE 0.9% FLUSH
3.0000 mL | Freq: Two times a day (BID) | INTRAVENOUS | Status: DC
  Administered 2023-10-04 – 2023-10-14 (×14): 3 mL via INTRAVENOUS

## 2023-10-04 NOTE — Plan of Care (Signed)
  Problem: Consults Goal: RH STROKE PATIENT EDUCATION Description: See Patient Education module for education specifics  Outcome: Progressing   Problem: RH BOWEL ELIMINATION Goal: RH STG MANAGE BOWEL WITH ASSISTANCE Description: STG Manage Bowel with toileting Assistance. Outcome: Progressing Goal: RH STG MANAGE BOWEL W/MEDICATION W/ASSISTANCE Description: STG Manage Bowel with Medication with mod I Assistance. Outcome: Progressing   Problem: RH BLADDER ELIMINATION Goal: RH STG MANAGE BLADDER WITH ASSISTANCE Description: STG Manage Bladder With toileting Assistance Outcome: Progressing Goal: RH STG MANAGE BLADDER WITH MEDICATION WITH ASSISTANCE Description: STG Manage Bladder With Medication With mod I Assistance. Outcome: Progressing   Problem: RH SAFETY Goal: RH STG ADHERE TO SAFETY PRECAUTIONS W/ASSISTANCE/DEVICE Description: STG Adhere to Safety Precautions With cues Assistance/Device. Outcome: Progressing   Problem: RH KNOWLEDGE DEFICIT Goal: RH STG INCREASE KNOWLEDGE OF DIABETES Description:  Patient and wife will be able to manage DM with medications and dietary modification using educational resources independently Outcome: Progressing Goal: RH STG INCREASE KNOWLEDGE OF HYPERTENSION Description: Patient and wife will be able to manage HTN with medications and dietary modification using educational resources independently Outcome: Progressing Goal: RH STG INCREASE KNOWLEGDE OF HYPERLIPIDEMIA Description: Patient and wife will be able to manage HLD with medications and dietary modification using educational resources independently Outcome: Progressing Goal: RH STG INCREASE KNOWLEDGE OF STROKE PROPHYLAXIS Description: Patient and wife will be able to manage Secondary stroke risks with medications and dietary modification using educational resources independently Outcome: Progressing

## 2023-10-04 NOTE — Progress Notes (Signed)
Physical Therapy Session Note  Patient Details  Name: [name] MRN: [id number] Date of Birth: 05-Sep-1976  Today's Date: 10/04/2023 PT Individual Time: 1310-1408 PT Individual Time Calculation (min): 58 min   Short Term Goals: Week 1:  PT Short Term Goal 1 (Week 1): Pt will complete transfer min assist x1 PT Short Term Goal 2 (Week 1): Pt will complete bed mobility with min assist PT Short Term Goal 3 (Week 1): Pt will complete WC mobility with supervision 150' PT Short Term Goal 4 (Week 1): Pt will initiate gait training  SESSION 1 Skilled Therapeutic Interventions/Progress Updates: Patient sitting EOB on the phone with wife teary eyed. PTA talked to wife on phone where she stated that pt was frustrated and did not want to participate in therapy any more. PTA discussed with pt after talking to wife about current presentation. Pt is expressively aphasic, so it took a little while to understand where pt's frustration stemmed from. PTA used white board and marker having the word "frustrated" at the top, and various topics underneath (therapy, sleep, physical) to which pt all said no to except for "eat." PTA talked to nutrition hostess and attending PT about pt and learned that pt has been having trouble ordering food, and also upset about diabetic diet set by attending physician (stated by NT). PTA discussed with pt that PTA will communicate with pt's wife about ordering food for him as this is what was done when pt was in acute care with no issue (stated by nutrition hostess). Pt mood increased and wanted to void bladder in urinal with PTA holding urinal. Pt then requested to change clothing sitting EOB with pt holding onto foot rail to maintain sitting balance. PTA doffed pt's personal underwear, and donned with modA and VC to assist with L UE. Pt then donned personal shorts with PTA assisting with threading through B LE's for time management, and pt to don as close up to waist. PTA donned B  silicone sleeves to residual limbs for time management. Pt donned B prosthetics with light minA to click into socket. Pt then min/modA to stand from EOB (slightly elevated for pt to don prosthetics). PTA donned lower body clothing around waist with pt cued to stand pivot to the R to sit in [facility] with PTA advancing R LE, and VC to reach back to control eccentric.    Patient sitting in WC at end of session with brakes locked, belt alarm set, and all needs within reach.  SESSION 2 Skilled Therapeutic Interventions/Progress Updates: Patient supine in bed on entrance to room. Patient alert and agreeable to PT session.   Patient reported no pain. Attending NT reported that pt had transferred self over from [facility] to bed without assistance (belt alarm was going off, but not attached to nsg cord). Pt stated waiting for over 20 minutes after calling for assistance. Pt encouraged that nsg can be busy with other patients and to wait until assistance can be provided due to R hemiparesis and B BKA (pt understood).  Therapeutic Activity: Bed Mobility: Pt performed supine<>sit on EOB with HOB elevated and supervision.  Transfers: Pt performed sit<>stand pivot transfers throughout session with min/modA. Provided VC for anterior weight shifting and with PTA advancing R LE as needed.  Gait Training:  Pt ambulated 50' in main gym using L HW with PTA on R side facilitating R LE advancement (+2 WC follow). Pt demonstrated the following gait deviations with therapist providing the described cuing and facilitation for  improvement:  - Requires maxA to advance R LE through swing phase with VC to advance in order for pt to increase neuromuscular connection. - min/modA to extend R knee through stance phase (pt able to maintain standing without buckling for the most part). - Pt VC to keep HW in close proximity and to advance L LE past R LE - pt demos upright posture with some flexion at hips  Neuromuscular Re-ed: - Pt performed  squat pivot (to the R) from WC<hi/low mat with modA and VC to anteriorly scoot. Pt has difficulty with navigating R LE due to R hemiparesis. Pt on edge of hi/low mat with mirror  in front and HW on L UE with VC to maintain center orientation. Pt with R lean and VC to correct to L with pt able to do so with minA, but required rest break shortly after. Pt squat pivot to the L with CGA and set up (easier to use L UE to pull self over) - Pt sitting edge of mat with PTA performing PNF contract-relax of R LE. Pt with multimodal cues of sequence (LAQ on L LE with PTA on both sides for added neuromuscular connection). Pt cued to push into PTA hand quickly after PTA passively moves R LE into quick stretch to increase muscle fiber recruitment of quadriceps. Noted to have slight push when R knee less than 90* flexion, but nothing past. - sit to stand with L LE on 2" step to increase WB and proprioceptive feedback on R LE  NMR performed for improvements in motor control and coordination, balance, sequencing, judgement, and self confidence/ efficacy in performing all aspects of mobility at highest level of independence.   Patient sitting in WC at end of session with brakes locked, belt alarm set, and all needs within reach.       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

## 2023-10-04 NOTE — Progress Notes (Signed)
PROGRESS NOTE   Subjective/Complaints:  Aphasic, but expresses to SLP dissatisfaction with Carb modified diet   ROS: Limited by aphasia.  Pertinent positives above.  Objective:   No results found. Recent Labs    10/02/23 0648  WBC 7.3  HGB 13.0  HCT 41.3  PLT 477*   Recent Labs    10/02/23 0648 10/04/23 0456  NA 135 137  K 3.6 4.3  CL 105 106  CO2 20* 24  GLUCOSE 126* 119*  BUN 18 33*  CREATININE 2.29* 2.47*  CALCIUM 8.4* 8.6*    Intake/Output Summary (Last 24 hours) at 10/04/2023 0849 Last data filed at 10/04/2023 3220 Gross per 24 hour  Intake 0 ml  Output 1705 ml  Net -1705 ml        Physical Exam: Vital Signs Blood pressure (!) 140/72, pulse 70, temperature 97.9 F (36.6 C), resp. rate 18, height 5\' 5"  (1.651 m), weight 79.8 kg, SpO2 100%.   General: No acute distress Mood and affect are appropriate Heart: Regular rate and rhythm no rubs murmurs or extra sounds Lungs: Clear to auscultation, breathing unlabored, no rales or wheezes Abdomen: Positive bowel sounds, soft nontender to palpation, nondistended Extremities: No clubbing, cyanosis, or edema Skin: No evidence of breakdown, no evidence of rash   + Word finding deficits, substitutions, and echolalia.  Some mild dysarthria but this is improving. + Apraxic and right inattention.  RUE antigravity elbow flexion, elbow extension, grip, and finger abduction with increased time.  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 RLE otherwise did not move today, also complicated by motor apraxia. LUE and LLE grossly antigravity and against resistance, 5 out of 5  decreased sense of pain RUE/RLE Musculoskeletal: residual limbs well shaped.  Bilateral BKA   Assessment/Plan: 1. Functional deficits which require 3+ hours per day of interdisciplinary therapy in a comprehensive inpatient rehab setting. Physiatrist is providing close team supervision  and 24 hour management of active medical problems listed below. Physiatrist and rehab team continue to assess barriers to discharge/monitor patient progress toward functional and medical goals  Care Tool:  Bathing    Body parts bathed by patient: Right arm, Chest, Abdomen, Front perineal area, Buttocks, Right upper leg, Left upper leg, Face   Body parts bathed by helper: Left arm Body parts n/a: Left lower leg, Right lower leg   Bathing assist Assist Level: Maximal Assistance - Patient 24 - 49%     Upper Body Dressing/Undressing Upper body dressing   What is the patient wearing?: Pull over shirt    Upper body assist Assist Level: Maximal Assistance - Patient 25 - 49%    Lower Body Dressing/Undressing Lower body dressing      What is the patient wearing?: Pants, Underwear/pull up     Lower body assist Assist for lower body dressing: Maximal Assistance - Patient 25 - 49%     Toileting Toileting    Toileting assist Assist for toileting: Maximal Assistance - Patient 25 - 49%     Transfers Chair/bed transfer  Transfers assist  Chair/bed transfer activity did not occur: Safety/medical concerns  Chair/bed transfer assist level: Minimal Assistance - Patient > 75% (squat pivot)  Locomotion Ambulation   Ambulation assist   Ambulation activity did not occur: Safety/medical concerns          Walk 10 feet activity   Assist  Walk 10 feet activity did not occur: Safety/medical concerns        Walk 50 feet activity   Assist Walk 50 feet with 2 turns activity did not occur: Safety/medical concerns         Walk 150 feet activity   Assist Walk 150 feet activity did not occur: Safety/medical concerns         Walk 10 feet on uneven surface  activity   Assist Walk 10 feet on uneven surfaces activity did not occur: Safety/medical concerns         Wheelchair     Assist Is the patient using a wheelchair?: Yes Type of Wheelchair: Manual     Wheelchair assist level: Moderate Assistance - Patient 50 - 74% Max wheelchair distance: 150    Wheelchair 50 feet with 2 turns activity    Assist        Assist Level: Moderate Assistance - Patient 50 - 74%   Wheelchair 150 feet activity     Assist      Assist Level: Moderate Assistance - Patient 50 - 74%   Blood pressure (!) 140/72, pulse 70, temperature 97.9 F (36.6 C), resp. rate 18, height 5\' 5"  (1.651 m), weight 79.8 kg, SpO2 100%.  Medical Problem List and Plan: 1. 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  Antithrombotics: -DVT/anticoagulation:  Pharmaceutical: Lovenox             -antiplatelet therapy: Aspirin and Plavix for three months followed by aspirin alone (started 9/26)   3. Pain Management: Tylenol as needed   4. Mood/Behavior/Sleep: LCSW to evaluate and provide emotional support             -antipsychotic agents: n/a   5. Neuropsych/cognition: This patient is not quite capable of making decisions on his own behalf.   -language improving. -may have capacity soon.   6. Skin/Wound Care: Routine skin care checks             -Discussed with nursing good skin check of bilateral residual limbs once in bed at rehab   7. Fluids/Electrolytes/Nutrition: strict Is and Os and follow-up chemistries   8: Hypertension: monitor TID and prn             -continue hydralazine 50 mg BID             -continue carvedilol 6.25 mg BID             -continue terazosin 2 mg q HS   10/6 continue elevation, particularly in morning. 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 Renal signed off  myfortic stopped in setting of bacteremia per his transplant center-- will need to be restarted at previous dose at d/c              Start IVF 10/9- recheck in am  13: GERD: continue PPI BID   14: DM s/p pancreas transplant: CBGs QID; A1c = 5.8% (no home meds listed)             -continue SSI             -consider oral agent             -started carb modified diet- pt refuses     - well controlled, continue current regimen 10/9 Recent Labs    10/03/23 1616 10/03/23 2107 10/04/23 0631  GLUCAP 149* 143* 117*     15: Nausea: resolved -discontinue Reglan -continue PPI -Zofran prn   15: UTI: E. coli isolated: continue ceftriaxone 2 grams q 24 hours (day #4)             -completed abx--observe   16: Urinary retention: purewick in place             10/6-continue PVRs thru today perhaps, in and out cath PRN   -pvr's all lesss than 160cc now, continent  -eob to void when possible  -terazosin now at  4 mg at bedtime   -             17: Hypokalemia: repleted; follow-up BMP 10-8 resolved with daily supplementation    18.  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

## 2023-10-04 NOTE — Progress Notes (Signed)
Speech Language Pathology Daily Session Note  Patient Details  Name: [name] MRN: [id number] Date of Birth: 03/18/1976  Today's Date: 10/04/2023 SLP Individual Time: 0800-0900 SLP Individual Time Calculation (min): 60 min  Short Term Goals: Week 1: SLP Short Term Goal 1 (Week 1): Patient will demonstrate functional problem solving for basic and familair tasks with Mod verbal and visual cues. SLP Short Term Goal 2 (Week 1): Patient will self-monitor and correct phonemic errors at the word level with Min verbal and visual cues. SLP Short Term Goal 3 (Week 1): Patient will follow 2-step commands with 50% accuracy and Mod verbal cues. SLP Short Term Goal 4 (Week 1): Patient will utilize multimodal communication to express basic wants/needs with Mod A multimodal cues. SLP Short Term Goal 5 (Week 1): Patient will name functional items with 50% accuracy and Mod verbal cues.  Skilled Therapeutic Interventions:  Patient was seen in am to address cognitive re- training. Pt was alert and seated upright in bed upon SLP arrival. He appeared mad c/b no response to clinician greeting and stating, "I'm not doing it today" "I'm pissed off" in reference to speech therapy. Clinician able to gather that patient was frustrated about his breakfast. Pt unable to verbalize details due to frequent neologisms and frustration. Per NT, pt was upset about sugar free substitutes. Given active listening and encouragement pt agreeable to participate in speech therapy. SLP called wife to follow up on list of family members names and relation to patient. SLP challenged pt in verbalizing biographical and personal information intermittently throughout session. Patient verbalized his name, DOB, age, dtr, and grandson given visual A with mod A across session. He was further challenged in automatic speech. He verbalized months of year and days of week given visual aid with min to mod A. SLP challenged pt by reducing amount of  written assist as success occurred at previous level with pt able to verbalize months of year and days of week with just initial remaining with mod A. After reciting information, pt able to identify temporal concepts with 25% acc with mod to max cues in multiple opportunities. Pt completed confrontation naming given items in his environment with 47% acc indep improving to 60% with mod multimodal cues. Pt was left upright in bed with bed alarm active and call button within reach. SLP to continue POC.   Pain Pain Assessment Pain Scale: 0-10 Pain Score: 0-No pain  Therapy/Group: Individual Therapy  Renaee Munda 10/04/2023, 11:28 AM

## 2023-10-04 NOTE — Progress Notes (Addendum)
Patient ID: [name], male   DOB: 03-21-1976, 47 y.o.   MRN: [id number]  Team Conference Report to Patient/Family  Team Conference discussion was reviewed with the patient and caregiver, including goals, any changes in plan of care and target discharge date.  Patient and caregiver express understanding and are in agreement.  The patient has a target discharge date of 10/25/23.  Sw met with patient to discuss d/c.  Sw spoke with pt spouse, Delaney Meigs to inform her of d/c and updates as well. Spouse informed SW that family will be assisting patient at home when she is working. Spouse will provide SW with paperwork for work [facility]) if needed.   Spouse reports patient BP typically runs higher and this is considered normal for him. Spouse expressed current regimen for BP medication should remain the same.  Pt and spouse have both expressed that that feel like pt's current discharge date is extensive. Pt and spouse would like the team to reconsider weekly. Spouse expressed that pt currently needs the rehab but she does not believe patient will need to remain until 10/30. Sw will discuss with team and have team reconsider/reconference. No additional questions or concerns.  Spouse requesting call from nurse. Nurse informed.   Andria Rhein 10/04/2023, 1:46 PM

## 2023-10-04 NOTE — Progress Notes (Signed)
Occupational Therapy Session Note  Patient Details  Name: [name] MRN: [id number] Date of Birth: Jul 23, 1976  Today's Date: 10/04/2023 OT Individual Time: 1030-1100 (unattended estim 1100-1200) OT Individual Time Calculation (min): 30 min    Short Term Goals: Week 1:  OT Short Term Goal 1 (Week 1): Patient will complete toilet transfer with mod A of 1 OT Short Term Goal 2 (Week 1): Patient will recall hemi-dressing techniques with min cues OT Short Term Goal 3 (Week 1): Patient will complete 1 step of LB dressing task  Skilled Therapeutic Interventions/Progress Updates:    Pt received in wc and agreeable to therapy. This session focused on RUE NMR with use of functional reach activities.  Pt worked on repetitive finger flex to full extension and wrist extension AROM followed by B hands on 1 lb dowel bar. Pt held grip on bar with therapist facilitating proximal shoulder movement with pt reaching bar forward and up.  Had pt work on pushing bar down and he was able to elicit more elb active extention.  Functional reach activity with pt leaning for and working on grasp and release (while therapist support arm) of picking up empty soda bottle, full 20 oz bottle, small can of soda and half full gatorade bottle. Worked on 5 cycles with pt demonstrating improved functional grasp but also with increased tone in wrist and elbow flexor.  Placed saebo estim on shoulder for cyclic estim unattended to deltoids.  Pt stated he could tolerate the intensity needed to contract the muscle.   Pt tolerated for 60 min unattended. Upon returning to room, pt in bed.  Helped him to position R arm on pillow. Pt upset with something related to his food,  tried to understand what pt was referring to.  Encouraged him to focus on the positive gains he is making and the need to be here to be able to go home.  Pt in bed with alarm set and all needs met.   Therapy Documentation Precautions:   Precautions Precautions: Fall, Other (comment) Precaution Comments: Bil BKA (prosthesis in room), R hemi, avoid hypotension Restrictions Weight Bearing Restrictions: No    Pain: Pain Assessment Pain Scale: 0-10 Pain Score: 0-No pain ADL: ADL Eating: Minimal assistance Grooming: Minimal assistance Upper Body Bathing: Moderate assistance Lower Body Bathing: Minimal assistance Where Assessed-Lower Body Bathing: Bed level Upper Body Dressing: Moderate assistance Where Assessed-Upper Body Dressing: Edge of bed Lower Body Dressing: Moderate assistance Where Assessed-Lower Body Dressing: Bed level Toileting: Maximal assistance Toilet Transfer: Maximal assistance    Therapy/Group: Individual Therapy  Zuleima Haser 10/04/2023, 12:23 PM

## 2023-10-05 DIAGNOSIS — I639 Cerebral infarction, unspecified: Secondary | ICD-10-CM | POA: Diagnosis not present

## 2023-10-05 DIAGNOSIS — Z89512 Acquired absence of left leg below knee: Secondary | ICD-10-CM | POA: Diagnosis not present

## 2023-10-05 DIAGNOSIS — I10 Essential (primary) hypertension: Secondary | ICD-10-CM | POA: Diagnosis not present

## 2023-10-05 DIAGNOSIS — N1832 Chronic kidney disease, stage 3b: Secondary | ICD-10-CM | POA: Diagnosis not present

## 2023-10-05 LAB — BASIC METABOLIC PANEL
Anion gap: 8 (ref 5–15)
BUN: 36 mg/dL — ABNORMAL HIGH (ref 6–20)
CO2: 20 mmol/L — ABNORMAL LOW (ref 22–32)
Calcium: 8.6 mg/dL — ABNORMAL LOW (ref 8.9–10.3)
Chloride: 107 mmol/L (ref 98–111)
Creatinine, Ser: 2.39 mg/dL — ABNORMAL HIGH (ref 0.61–1.24)
GFR, Estimated: 33 mL/min — ABNORMAL LOW (ref >60)
Glucose, Bld: 130 mg/dL — ABNORMAL HIGH (ref 70–99)
Potassium: 4.6 mmol/L (ref 3.5–5.1)
Sodium: 135 mmol/L (ref 135–145)

## 2023-10-05 NOTE — Plan of Care (Signed)
  Problem: RH BOWEL ELIMINATION Goal: RH STG MANAGE BOWEL WITH ASSISTANCE Description: STG Manage Bowel with toileting Assistance. Outcome: Progressing Goal: RH STG MANAGE BOWEL W/MEDICATION W/ASSISTANCE Description: STG Manage Bowel with Medication with mod I Assistance. Outcome: Progressing   Problem: RH BLADDER ELIMINATION Goal: RH STG MANAGE BLADDER WITH ASSISTANCE Description: STG Manage Bladder With toileting Assistance Outcome: Progressing Goal: RH STG MANAGE BLADDER WITH MEDICATION WITH ASSISTANCE Description: STG Manage Bladder With Medication With mod I Assistance. Outcome: Progressing   Problem: RH SAFETY Goal: RH STG ADHERE TO SAFETY PRECAUTIONS W/ASSISTANCE/DEVICE Description: STG Adhere to Safety Precautions With cues Assistance/Device. Outcome: Progressing   Problem: RH KNOWLEDGE DEFICIT Goal: RH STG INCREASE KNOWLEDGE OF HYPERTENSION Description: Patient and wife will be able to manage HTN with medications and dietary modification using educational resources independently Outcome: Progressing Goal: RH STG INCREASE KNOWLEDGE OF STROKE PROPHYLAXIS Description: Patient and wife will be able to manage Secondary stroke risks with medications and dietary modification using educational resources independently Outcome: Progressing

## 2023-10-05 NOTE — Progress Notes (Signed)
Speech Language Pathology Daily Session Note  Patient Details  Name: [name] MRN: [id number] Date of Birth: February 12, 1976  Today's Date: 10/05/2023 SLP Individual Time: 1100-1200 SLP Individual Time Calculation (min): 60 min  Short Term Goals: Week 1: SLP Short Term Goal 1 (Week 1): Patient will demonstrate functional problem solving for basic and familair tasks with Mod verbal and visual cues. SLP Short Term Goal 2 (Week 1): Patient will self-monitor and correct phonemic errors at the word level with Min verbal and visual cues. SLP Short Term Goal 3 (Week 1): Patient will follow 2-step commands with 50% accuracy and Mod verbal cues. SLP Short Term Goal 4 (Week 1): Patient will utilize multimodal communication to express basic wants/needs with Mod A multimodal cues. SLP Short Term Goal 5 (Week 1): Patient will name functional items with 50% accuracy and Mod verbal cues.  Skilled Therapeutic Interventions: Skilled therapy session focused on communicative goals. SLP facilitated session by providing mod-max written assist to aid patient in verbalization of short phrases. Phrases included "my name is ___, my dogs name is ___ and my wife's name is___." To address communication w/ nursing, SLP wrote common phrases on a piece of paper to aid in speech fluency outside of session. Patient with fluent speech when practicing reading phrases (ex: I need to go to the bathroom, I am hungry). SLP addressed automatic speech through days of the week and months of the year. Patient required less assist this date, only requiring supervision-minA to name all days and months. At the conclusion of the session, patient participated in confrontational naming task. Patient identified functional items with 60% accuracy independently and remainder with minA (phonemic cues). Patient speech continues to be characterized by perseveration's and neologisms, though awareness of errors is improving. Patient left in chair  with alarm set and call bell in reach. Continue POC.   Pain Pain Assessment Pain Scale: 0-10 Pain Score: 0-No pain  Therapy/Group: Individual Therapy  Wilberto Console M.A., CF-SLP 10/05/2023, 12:22 PM

## 2023-10-05 NOTE — Progress Notes (Signed)
Physical Therapy Session Note  Patient Details  Name: [name] MRN: [id number] Date of Birth: 1976/02/10  Today's Date: 10/05/2023 PT Individual Time: 1350-1505 PT Individual Time Calculation (min): 75 min   Short Term Goals: Week 1:  PT Short Term Goal 1 (Week 1): Pt will complete transfer min assist x1 PT Short Term Goal 2 (Week 1): Pt will complete bed mobility with min assist PT Short Term Goal 3 (Week 1): Pt will complete WC mobility with supervision 150' PT Short Term Goal 4 (Week 1): Pt will initiate gait training  Skilled Therapeutic Interventions/Progress Updates: Patient sitting in [facility] Hights with wife present on entrance to room. Patient alert and agreeable to PT session.   Patient reported no pain at beginning of session.  Therapeutic Activity: Transfers: Pt performed sit<>stands during session to [facility] with modA. Provided VC to anteriorly scoot with modA to advance L hip.  Pt performed squat pivot to the R with modA, and to the L with CGA with VC for anterior scoot (modA to scoot L hip from WC<edge of mat, and no assistance when transferring from edge of mat<WC at end of session - VC also for head/hip relationship).   Gait Training:  Pt ambulated 46' x 1 and roughly 50' x 1 using HW on L UE with PTA on R side facilitating R LE advancement. Pt demonstrated the following gait deviations with therapist providing the described cuing and facilitation for improvement:  - maxA to advance R LE and to extend knee during stance phase - L LE into slight abduction during first trial with VS to maintain straight line during swing phase by keeping line on tiles in middle of shoe (PTA performed MMT of L hip flexors showing them to be 3+, and L hip abductors 4+, potentially indicating L abductor compensation). - Wife present throughout providing WC follow and past history. Wife and pt reported that pt ambulated with L LE in slight abduction prior to infarct (did not happen on R LE).    Neuromuscular Re-ed: - [place] e-stim device set on L NMES Atrophy to R mid quadriceps belly and VMO (B prosthetics donned) - intensity 53 with palpable muscle contraction - after completion of e-stim, pt denies any negative side effects with skin intact and no adverse side effects noted. 1st round started at 3 minutes and 30 seconds to assess tolerance, then 5 minutes on same settings (pt wanted to stop with 2 minutes left - no reports of pain, burning, or uncomfortableness - only wanting to stop just to stop).   - Pt also with mirror in front for visual feedback, and instructional cues to perform SAQ on L LE when R LE activates (bolster under B knees).   - Pt noted to have quad/hip flexor activation in R LE following e-stim with ability to lift R LE several inches above bolster - R prosthetic donned.  - PTA performed contract relax on L LE while pt sitting edge of mat with noted increase in strength from previous day session.   NMR performed for improvements in motor control and coordination, balance, sequencing, judgement, and self confidence/ efficacy in performing all aspects of mobility at highest level of independence.   Patient sitting in WC at end of session with brakes locked, belt alarm set, and all needs within reach.      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), R hemi, avoid hypotension Restrictions Weight Bearing Restrictions: No  Therapy/Group: Individual Therapy  Lorea Kupfer PTA 10/05/2023, 3:19 PM

## 2023-10-05 NOTE — Progress Notes (Signed)
Occupational Therapy Session Note  Patient Details  Name: [name] MRN: [id number] Date of Birth: Oct 19, 1976  Today's Date: 10/05/2023 OT Individual Time: 7829-5621 OT Individual Time Calculation (min): 78 min    Short Term Goals: Week 1:  OT Short Term Goal 1 (Week 1): Patient will complete toilet transfer with mod A of 1 OT Short Term Goal 2 (Week 1): Patient will recall hemi-dressing techniques with min cues OT Short Term Goal 3 (Week 1): Patient will complete 1 step of LB dressing task  Skilled Therapeutic Interventions/Progress Updates:  Pt greeted supine in bed, pt agreeable to OT intervention.      Transfers/bed mobility: pt completed supine>sit with MODA, assist needed to maneuver RLE to EOB and assist needed to elevate trunk into sitting. Squat pivot to w/c to R side with MODA, VCs needed for head/hips relationship.   ADLs:  Grooming: set- up to wash face from bed level  UB dressing: MAX A to don shirt from EOB, MAX cues for hemitechnique LB dressing: MAX A to don underwear and pants from bed level via rolling R<>L Footwear:  MAX A to don bilateral prosthetics from EOB, pt did assist with donning L   Bathing: pt completed bed level bathing with MAXA, education provided on hemi techniques as needed.    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

## 2023-10-05 NOTE — Patient Care Conference (Signed)
Inpatient RehabilitationTeam Conference and Plan of Care Update Date: 10/04/2023   Time: 1007 am     Patient Name: [name]      Medical Record Number: [id number]  Date of Birth: 08/24/1976 Sex: Male         Room/Bed: 4M10C/4M10C-01 Payor Info: Payor: Advertising copywriter MEDICARE / Plan: Suan Halter DUAL COMPLETE / Product Type: *No Product type* /    Admit Date/Time:  09/29/2023  5:22 PM  Primary Diagnosis:  Acute CVA (cerebrovascular accident) [facility])  Hospital Problems: Principal Problem:   Acute CVA (cerebrovascular accident) [facility]) Active Problems:   Ischemic cerebrovascular accident (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 Claudette Laws Nurse Present: Chana Bode, RN PT Present: Ralph Leyden, PT OT Present: Primitivo Gauze, OT SLP Present: Everardo Pacific, SLP     Current Status/Progress Goal Weekly Team Focus  Bowel/Bladder   Patient is continent of bladder/bowel,   Maintain continence   Assess tolieting needs Checking PVRs   Swallow/Nutrition/ Hydration               ADL's   mod A UB self care, mod-max LB self care bed level, total A toileting, max A squat pivot transfers, dense R hemiplegia in proximal muscles but now has active finger flex and extension   min A bathing, LB dressing, toileting, toilet transfers, tub shower transfer and sit to stand with prosthetics.   ADL training, RUE NMR, balance, pt education    Mobility   Bed mobility = ModA, sitting balance = CGA, transfers = maxA to stand, squat pivot = MinA to R and MaxA to L, ambulation at hallway HR with MaxA for balance and RLE advancement   overall CGA with MinA for ambulation  Barriers: R hemi, impulsiveness, balance /// Work on: NMR for R hemibody, standing balance, improving LOA with transfers, ambulation,w/c mobility     Communication   nonfluent consistent w/ neologisms and perseverations, requires mod-maxA with following commands   supervision-minA   use of AAC device and communication of wants/needs    Safety/Cognition/ Behavioral Observations  nonstandardized evaluation reports deficits in problem solving and emergent awareness, though not formally tested   supervision A   education regarding emergent awareness and problem sovling strategies    Pain      N/A          Skin      N/A           Discharge Planning:  Patient discharging home with spouse. Spouse works as Management consultant at [employer] (plans to take [employer] or [facility]). 2 level home, 4 steps to enter. 1/2 bath on main level   Team Discussion: Post watershed infarct with right hemiparesis, aphasia, apraxia, shoulder subluxation right visual field cut and impulsivity and decreased sensory awareness. Patient on target to meet rehab goals: yes, Mod A for UB and max A for LB care and max A for balance. Min A for Right Squat Pivot and max A for left Squat Pivot transfer. Pt with non-fluent speech neologism, and perseveration, poor awareness and problem solving. Communicates better with written information. Goals for discharge are set for min A overall.     *See Care Plan and progress notes for long and short-term goals.   Revisions to Treatment Plan:  Alternative/adaptive communication device, E-stim  Teaching Needs: Safety, medications, dietary modification, transfers, toileting, etc.   Current Barriers to Discharge: Home  enviroment access/layout  Possible Resolutions to Barriers: Family education  HH follow up services     Medical Summary Current Status: lability of Sys BP, severe aphasia  Barriers to Discharge: Medical stability;Other (comments)  Barriers to Discharge Comments: severe aphasia Possible Resolutions to [employer] Focus: cont N-M re ed, SLP for aphasia   Continued Need for Acute Rehabilitation Level of Care: The patient  requires daily medical management by a physician with specialized training in physical medicine and rehabilitation for the following reasons: Direction of a multidisciplinary physical rehabilitation program to maximize functional independence : Yes Medical management of patient stability for increased activity during participation in an intensive rehabilitation regime.: Yes Analysis of laboratory values and/or radiology reports with any subsequent need for medication adjustment and/or medical intervention. : Yes   I attest that I was present, lead the team conference, and concur with the assessment and plan of the team.   Felisa Bonier Deicy Rusk 10/05/2023, 11:49 AM

## 2023-10-05 NOTE — Progress Notes (Signed)
PROGRESS NOTE   Subjective/Complaints:  Reviewed labs little change vs yesterday  Oral intake recorded yesterday but pt states he is drinking Multiple bottles on tray gatorade and smart water   ROS: Limited by aphasia.  Pertinent positives above.  Objective:   No results found. No results for input(s): "WBC", "HGB", "HCT", "PLT" in the last 72 hours.  Recent Labs    10/04/23 0456 10/05/23 0641  NA 137 135  K 4.3 4.6  CL 106 107  CO2 24 20*  GLUCOSE 119* 130*  BUN 33* 36*  CREATININE 2.47* 2.39*  CALCIUM 8.6* 8.6*    Intake/Output Summary (Last 24 hours) at 10/05/2023 0816 Last data filed at 10/05/2023 0814 Gross per 24 hour  Intake 603 ml  Output 1550 ml  Net -947 ml        Physical Exam: Vital Signs Blood pressure (!) 145/74, pulse 69, temperature 97.6 F (36.4 C), resp. rate 16, height 5\' 5"  (1.651 m), weight 79.8 kg, SpO2 98%.   General: No acute distress Mood and affect are appropriate Heart: Regular rate and rhythm no rubs murmurs or extra sounds Lungs: Clear to auscultation, breathing unlabored, no rales or wheezes Abdomen: Positive bowel sounds, soft nontender to palpation, nondistended Extremities: No clubbing, cyanosis, or edema Skin: No evidence of breakdown, no evidence of rash   + aphasia, fluent with neologisms , able to repeat, name simple object s, correctly selects "hospital" as current location  + Apraxic and right inattention.  RUE antigravity elbow flexion, elbow extension, grip, and finger abduction with increased time.  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 RLE otherwise did not move today, also complicated by motor apraxia. LUE and LLE grossly antigravity and against resistance, 5 out of 5  decreased sense of pain RUE/RLE Musculoskeletal: residual limbs well shaped.  Bilateral BKA   Assessment/Plan: 1. Functional deficits which require 3+ hours per day of  interdisciplinary therapy in a comprehensive inpatient rehab setting. Physiatrist is providing close team supervision and 24 hour management of active medical problems listed below. Physiatrist and rehab team continue to assess barriers to discharge/monitor patient progress toward functional and medical goals  Care Tool:  Bathing    Body parts bathed by patient: Right arm, Chest, Abdomen, Front perineal area, Buttocks, Right upper leg, Left upper leg, Face   Body parts bathed by helper: Left arm Body parts n/a: Left lower leg, Right lower leg   Bathing assist Assist Level: Maximal Assistance - Patient 24 - 49%     Upper Body Dressing/Undressing Upper body dressing   What is the patient wearing?: Pull over shirt    Upper body assist Assist Level: Maximal Assistance - Patient 25 - 49%    Lower Body Dressing/Undressing Lower body dressing      What is the patient wearing?: Pants, Underwear/pull up     Lower body assist Assist for lower body dressing: Maximal Assistance - Patient 25 - 49%     Toileting Toileting    Toileting assist Assist for toileting: Maximal Assistance - Patient 25 - 49%     Transfers Chair/bed transfer  Transfers assist  Chair/bed transfer activity did not occur: Safety/medical concerns  Chair/bed transfer assist level: Minimal Assistance - Patient > 75% (squat pivot)     Locomotion Ambulation   Ambulation assist   Ambulation activity did not occur: Safety/medical concerns          Walk 10 feet activity   Assist  Walk 10 feet activity did not occur: Safety/medical concerns        Walk 50 feet activity   Assist Walk 50 feet with 2 turns activity did not occur: Safety/medical concerns         Walk 150 feet activity   Assist Walk 150 feet activity did not occur: Safety/medical concerns         Walk 10 feet on uneven surface  activity   Assist Walk 10 feet on uneven surfaces activity did not occur: Safety/medical  concerns         Wheelchair     Assist Is the patient using a wheelchair?: Yes Type of Wheelchair: Manual    Wheelchair assist level: Moderate Assistance - Patient 50 - 74% Max wheelchair distance: 150    Wheelchair 50 feet with 2 turns activity    Assist        Assist Level: Moderate Assistance - Patient 50 - 74%   Wheelchair 150 feet activity     Assist      Assist Level: Moderate Assistance - Patient 50 - 74%   Blood pressure (!) 145/74, pulse 69, temperature 97.6 F (36.4 C), resp. rate 16, height 5\' 5"  (1.651 m), weight 79.8 kg, SpO2 98%.  Medical Problem List and Plan: 1. Functional deficits secondary to watershed stroke with subcortical infarction in the left occipital lobe and posterior left frontal lobe              -patient may shower             -ELOS/Goals: 18-21 days, min assist PT, min assist OT, supervision SLP goals              -Dense R hemiparesis - would order WHO on rehab admission; PRAFO not needed d/t BKA   -Continue CIR therapies including PT, OT, and SLP -  2.  Antithrombotics: -DVT/anticoagulation:  Pharmaceutical: Lovenox             -antiplatelet therapy: Aspirin and Plavix for three months followed by aspirin alone (started 9/26)   3. Pain Management: Tylenol as needed   4. Mood/Behavior/Sleep: LCSW to evaluate and provide emotional support             -antipsychotic agents: n/a   5. Neuropsych/cognition: This patient is not quite capable of making decisions on his own behalf.   -language improving. -may have capacity soon.   6. Skin/Wound Care: Routine skin care checks             -Discussed with nursing good skin check of bilateral residual limbs once in bed at rehab   7. Fluids/Electrolytes/Nutrition: strict Is and Os and follow-up chemistries   8: Hypertension: monitor TID and prn             -continue hydralazine 50 mg BID             -continue carvedilol 6.25 mg BID             -continue terazosin 2 mg q HS    10/6 continue elevation, particularly in morning. 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 Renal signed off  myfortic stopped in setting of bacteremia per his transplant center-- will need to be restarted at previous dose at d/c              Start IVF 10/9- recheck in am  13: GERD: continue PPI BID   14: DM s/p pancreas transplant: CBGs QID; A1c = 5.8% (no home meds listed)             -discontinue SSI                  -started carb modified diet- pt refuses now on reg diet (same as home)    - well controlled, continue current regimen 10/10 Recent Labs    10/03/23 1616 10/03/23 2107 10/04/23 0631  GLUCAP 149* 143* 117*     15: Nausea: resolved -discontinue Reglan -continue PPI -Zofran prn   15: UTI: E. coli isolated: continue ceftriaxone 2 grams q 24 hours (day #4)             -completed abx--observe   16: Urinary retention: purewick in place             10/6-continue PVRs thru today perhaps, in and out cath PRN   -pvr's all lesss than 160cc  now, continent  -eob to void when possible  -terazosin now at 4 mg at bedtime   -             17: Hypokalemia: repleted; follow-up BMP 10-8 resolved with daily supplementation    18.  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

## 2023-10-06 DIAGNOSIS — N1832 Chronic kidney disease, stage 3b: Secondary | ICD-10-CM | POA: Diagnosis not present

## 2023-10-06 DIAGNOSIS — I639 Cerebral infarction, unspecified: Secondary | ICD-10-CM | POA: Diagnosis not present

## 2023-10-06 DIAGNOSIS — I10 Essential (primary) hypertension: Secondary | ICD-10-CM | POA: Diagnosis not present

## 2023-10-06 DIAGNOSIS — Z89512 Acquired absence of left leg below knee: Secondary | ICD-10-CM | POA: Diagnosis not present

## 2023-10-06 DIAGNOSIS — G3184 Mild cognitive impairment, so stated: Secondary | ICD-10-CM

## 2023-10-06 NOTE — Consult Note (Signed)
Neuropsychological Consultation Comprehensive Inpatient Rehab   Patient:   [name]   DOB:   [birth year]  MR Number:  [id number]  Location:  [facility] [address] CENTER B [address] [place] [address] Dept: [phone] Loc: [phone]           Date of Service:   10/06/2023  Start Time:   10 AM End Time:   11 AM  Provider/Observer:  Arley Phenix, Psy.D.       Clinical Neuropsychologist       Billing Code/Service: [id number]  Reason for Service:    [name] is a 47 year old male referred for neuropsychological consultation due to coping and adjustment issues and history of depression and recent cerebrovascular accident.  The patient is currently admitted onto the comprehensive inpatient rehabilitation unit due to residual cognitive changes from his recent CVA.  Patient has a past medical history including hypertension, type 2 diabetes, peripheral artery disease and status post bilateral below the knee amputations.  Patient is also status post renal and pancreatic transplant on chronic immunosuppressants.  Patient presented to the emergency department with concerns of altered mental status after he was at the infusion center for his immunosuppressive therapy.  During his infusion he became hypotensive and had full body burning sensations.  Patient was altered at ED and had blood pressure of 99/86.  He was minimally responsive in the ED.  Code stroke was called and the patient was intubated for concerns over protecting his airway.  Patient was treated for E. coli UTI/bacteria Nema.  As the patient improves some cognitively he continued with right side hemiparesis with significant right arm weakness and mild right leg weakness.  Speech has improved and he has become more fluent and is following commands.  MRI head identified scattered small foci of acute subcortical infarction in the left occipital lobe and posterior  left frontal lobe.  There is also some abnormalities noted in left parietal lobe that could reflect evolving infarct versus seizure related crypto toxic edema.  Occluded left ICA with reconstitution was noted.  Patient has taken sertraline in the past primarily between 2012 and 2013 and is on no current psychotropic medications and was admitted on no home psychotropic medications.  During today's clinical interview the patient was there with 2 family members who he indicated he preferred they stayed in the room while we talked.  Patient displayed very flat affect but did demonstrate understanding about what was being said.  Patient was able to express himself but his expressive language was slow and word-finding issues were noted.  Patient was slowed information processing speed.  Executive functioning was not thoroughly assessed.  Patient primarily focused on changes in his capacity to move primarily the right side of his body effectively but notes that he has been improving.  We discussed issues of past depressive symptoms and while the patient acknowledged ongoing frustration and anhedonia he denied severe depressive symptomatology.  Patient reported that he has been motivated and participating in therapies and that his mood state has not had an overly negative impact on his capacity to participate.  HPI for the current admission:    HPI: [name] is a 47 year old male with a past medical history of hypertension, type 2 diabetes, PAD status post 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 Patient was at his  infusion center today getting his Belatacept infusion for chronic immunosuppressive therapy and during his infusion he became hypotensive and had full body burning sensation. Patient did receive Benadryl at the infusion center and patient was  transported to the [facility] for further evaluation and management. On initial arrival to the emergency department patient was altered, and had blood pressure of 99/86. Patient was altered, and minimally responsive. Patient was given IM epinephrine with mild improvement in mental status. Patient was mumbling and nonsensical, and therefore with concern for not protecting his own airway, patient was emergently intubated. Patient subsequently had code stroke called. Patient was admitted to the ICU for further evaluation management of altered mental status. Treated for E. coli UTI/bacteremia. Continues to have right hemiparesis with significant right arm weakness and mild right leg weakness. He is speech is improving but remains nonfluent but able to speak sentences and follows commands well. 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 Extubated 9/26. Nausea treated with scheduled Reglan. AKI atop CKD II-IIIa. Nephrology consulted 9/30. Immunosuppressant drug regimen reviewed/adjusted. Good UOP and no significant PVRs. Tolerating diet. Patient requiring mod A bed mobility then mod x 2 to stand to STEDY then min A of 2 for safety. Participating with multiple STS and standing weight shifting with therapist assist R UE and R knee ext. Decreased awareness of R sided deficits. 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   Medical History:   Past Medical History:  Diagnosis Date   AMPUTATION, BELOW KNEE, 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   Diabetes mellitus without complication [facility])    no since pancreas transplant   Dialysis patient [facility]) 04/18/2012   "[facility] [place], [place], [place]"   Gastroparesis    Gastropathy    GERD (gastroesophageal reflux disease)    Hypertension    MRSA infection    over 10 years ago per patient. in legs         Patient Active Problem List   Diagnosis Date Noted   Acute CVA (cerebrovascular accident) (HCC) 09/29/2023   Ischemic cerebrovascular accident (CVA) due to global hypoperfusion with watershed infarction [facility]) 09/20/2023   Anaphylactic shock 09/20/2023   Altered mental status 09/20/2023   PSA elevation 09/18/2023   Hypertension secondary to other renal disorders 09/18/2023   Acute metabolic encephalopathy 09/18/2023   UTI (urinary tract infection) 09/04/2023   PAD (peripheral artery disease) (HCC) 09/04/2023   Diabetes mellitus type II, controlled (HCC) 09/04/2023   E. coli UTI (urinary tract infection) 07/20/2023   Abnormal electrocardiogram (ECG) (EKG) 07/20/2023   Chronic pain 07/04/2023   Tobacco dependence 07/04/2023   Marijuana dependence (HCC) 07/04/2023   Screen for colon cancer 01/10/2022   Balanitis 01/10/2022   Encounter for general adult medical examination with abnormal findings 01/10/2022   Screening for prostate cancer 01/10/2022   Abnormal EKG 10/26/2017   Healthcare maintenance 08/26/2016   Impaired mobility and activities of daily living 07/03/2014   Renal transplant, status post 07/19/2013   History of pancreas transplant (HCC) 07/19/2013   Immunosuppressed status (HCC) 06/27/2013   H/O pancreas transplant (HCC) 06/27/2013   Diabetic gastroparesis  associated with type 1 diabetes mellitus (HCC) 05/08/2013   S/P bilateral BKA (below knee amputation) (HCC) 08/17/2012   Metabolic bone disease 07/20/2011   ESRD (end stage renal disease) (HCC) 11/17/2009   GERD 03/14/2007   Dyslipidemia, goal LDL below 100 02/22/2007   Major depressive disorder, recurrent episode (HCC) 02/22/2007   POST TRAUMATIC STRESS DISORDER 02/22/2007   IMPOTENCE, 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 his dress was Appropriate and he was Well Groomed and his manners were Appropriate to the situation.  his participation was indicative of Appropriate and Redirectable behaviors.  There were physical disabilities noted and the patient was in his wheelchair during our visit.  he displayed an appropriate level of cooperation and motivation.    Interactions:    Minimal Appropriate and Redirectable  Attention:   abnormal and attention span appeared shorter than expected for age  Memory:   abnormal; remote memory intact, recent memory impaired  Visuo-spatial:   abnormal  Speech (Volume):  low  Speech:   Very slow response style and word finding difficulties.;  Thought Process:  Coherent and Circumstantial  Coherent and Logical  Though Content:  WNL; not suicidal and not homicidal  Orientation:   person, place, and situation  Judgment:   Fair  Planning:   Poor  Affect:    Blunted and Flat  Mood:    Dysphoric  Insight:   Fair  Intelligence:   normal  Psychiatric History:  Patient does have a history of depression and previous treatment with SSRIs a decade ago and is on no current home medications but denies feeling like he needs to start on any SSRI medications.  Family Med/Psych History:  Family History  Problem Relation Age of Onset   Hypertension Mother    Diabetes Mother    Kidney disease Mother    Diabetes Maternal Grandmother    Diabetes Paternal Grandmother    Diabetes Other    Hypertension Other    Lung cancer Maternal Aunt    Colon cancer Neg Hx    Esophageal cancer Neg Hx    Rectal cancer Neg Hx    Stomach cancer Neg Hx      Impression/DX:   [name] is a 47 year old male referred for neuropsychological consultation due to coping and adjustment issues and history of depression and recent cerebrovascular  accident.  The patient is currently admitted onto the comprehensive inpatient rehabilitation unit due to residual cognitive changes from his recent CVA.  Patient has a past medical history including hypertension, type 2 diabetes, peripheral artery disease and status post bilateral below the knee amputations.  Patient is also status post renal and pancreatic transplant on chronic immunosuppressants.  Patient presented to the emergency department with concerns of altered mental status after he was at the infusion center for his immunosuppressive therapy.  During his infusion he became hypotensive and had full body burning sensations.  Patient was altered at ED and had blood pressure of 99/86.  He was minimally responsive in the ED.  Code stroke was called and the patient was intubated for concerns over protecting his airway.  Patient was treated for E. coli UTI/bacteria Nema.  As the patient improves some cognitively he continued with right side hemiparesis with significant right arm weakness and mild right leg weakness.  Speech has improved and he has become more fluent and is following  commands.  MRI head identified scattered small foci of acute subcortical infarction in the left occipital lobe and posterior left frontal lobe.  There is also some abnormalities noted in left parietal lobe that could reflect evolving infarct versus seizure related crypto toxic edema.  Occluded left ICA with reconstitution was noted.  Patient has taken sertraline in the past primarily between 2012 and 2013 and is on no current psychotropic medications and was admitted on no home psychotropic medications.  During today's clinical interview the patient was there with 2 family members who he indicated he preferred they stayed in the room while we talked.  Patient displayed very flat affect but did demonstrate understanding about what was being said.  Patient was able to express himself but his expressive language was slow and  word-finding issues were noted.  Patient was slowed information processing speed.  Executive functioning was not thoroughly assessed.  Patient primarily focused on changes in his capacity to move primarily the right side of his body effectively but notes that he has been improving.  We discussed issues of past depressive symptoms and while the patient acknowledged ongoing frustration and anhedonia he denied severe depressive symptomatology.  Patient reported that he has been motivated and participating in therapies and that his mood state has not had an overly negative impact on his capacity to participate.  Disposition/Plan:  Today we worked on coping and adjustment issues with the patient's extended hospital stay, recent CVA and longstanding medical issues status post renal and pancreas transplant on long-term immunosuppressant medications and bilateral BKA.          Electronically Signed   _______________________ Arley Phenix, Psy.D. Clinical Neuropsychologist

## 2023-10-06 NOTE — Progress Notes (Signed)
Occupational Therapy Weekly Progress Note  Patient Details  Name: [name] MRN: [id number] Date of Birth: 1976/03/30  Beginning of progress report period: September 30, 2023 End of progress report period: October 06, 2023  Today's Date: 10/06/2023 OT Individual Time: 4098-1191 ([phone] unattended estim) OT Individual Time Calculation (min): 75 min    Patient has met 2 of 3 short term goals.  Pt is making progress with his transfers, sitting balance, self care skills, R hand and wrist AROM.  He has also made progress with his visual scanning to the R and and awareness of his R side. He did not meet goal of recalling steps for dressing but is progressing in that direction.   Patient continues to demonstrate the following deficits: abnormal tone, unbalanced muscle activation, and decreased motor planning, decreased attention to right, decreased awareness, decreased problem solving, decreased safety awareness, and decreased memory, and decreased sitting balance, decreased standing balance, decreased postural control, hemiplegia, and decreased balance strategies and therefore will continue to benefit from skilled OT intervention to enhance overall performance with BADL.  Patient progressing toward long term goals..  Continue plan of care.  OT Short Term Goals Week 1:  OT Short Term Goal 1 (Week 1): Patient will complete toilet transfer with mod A of 1 OT Short Term Goal 1 - Progress (Week 1): Met OT Short Term Goal 2 (Week 1): Patient will recall hemi-dressing techniques with min cues OT Short Term Goal 2 - Progress (Week 1): Progressing toward goal OT Short Term Goal 3 (Week 1): Patient will complete 1 step of LB dressing task OT Short Term Goal 3 - Progress (Week 1): Met Week 2:  OT Short Term Goal 1 (Week 2): Pt will don a t shirt with min A demonstrating improved motor planning and R side awareness. OT Short Term Goal 2 (Week 2): Pt will complete squat pivot transfers to toilet and  or BSC with min A. OT Short Term Goal 3 (Week 2): Pt will be able to use R hand to grasp wash cloth while helper assists moving arm to wash his L arm  Skilled Therapeutic Interventions/Progress Updates:    Pt received in bed ready for therapy. Pt had on tank top and jean shorts and shook his head no when I suggested he take a bath today. He said he already had bathed this am.  As I was helping pt come to EOB noted he shorts felt damp. Assisted pt to don prosthetics.  Increased tone in R hamstring which makes donning sleeve more challenging. His NT arrived and assisted [place] with self care tasks in standing. With prosthetics on, pt able to rise to stand from EOB with min - mod A (4x total) and hold stand balance with min -mod A with total A to hold R hand on RW handle. Cues to extend R knee.  Despite having some grasp, pt unable to dual task to maintain grasp on walker handle and hold stand.  NT assisted with washing his bottom and adjusting clothing over hips.   Pt able to doff and don pants over limbs with mod A.  Improved sit balance EOB with CGA.   He completed squat pivot in 2 movements to the R with only CGA and cues. His wife brought in a watch for him to wear on his R wrist to help with attention to the R.  Once in chair, pt taken to gym for RUE NMR using the saebo MAS. Worked on functional grasp and  release task with cones with cues for pt to use his active wrist extensor muscles.  Pt had trace movement in shoulder to move arm in MAS.  Pt worked with this activity for over 20 minutes.   Pt returned to room and I applied Saebo estim stim one to R shoulder for cyclic unattended estim.   Pt tolerated estim for 1 hour.  No adverse affects.  Saebo Stim One 330 pulse width 35 Hz pulse rate On 8 sec/ off 8 sec Ramp up/ down 2 sec Symmetrical Biphasic wave form  Max intensity at 500 Ohm load   Pt resting in wc with belt alarm on and all needs met.   Therapy Documentation Precautions:   Precautions Precautions: Fall, Other (comment) Precaution Comments: Bil BKA (prosthesis in room), R hemi, avoid hypotension Restrictions Weight Bearing Restrictions: No   Pain: Pain Assessment Pain Scale: 0-10 Pain Score: 0-No pain ADL: ADL Eating: Set up Grooming: Setup Upper Body Bathing: Minimal assistance Lower Body Bathing: Minimal assistance Where Assessed-Lower Body Bathing: Bed level Upper Body Dressing: Moderate assistance Where Assessed-Upper Body Dressing: Edge of bed Lower Body Dressing: Moderate assistance Where Assessed-Lower Body Dressing: Bed level Toileting: Maximal assistance Toilet Transfer:moderate assistance   Therapy/Group: Individual Therapy  Ercil Cassis 10/06/2023, 12:21 PM

## 2023-10-06 NOTE — Progress Notes (Signed)
Speech Language Pathology Weekly Progress and Session Note  Patient Details  Name: [name] MRN: [id number] Date of Birth: 1976-10-15  Beginning of progress report period: September 30, 2023 End of progress report period: October 06, 2023  Today's Date: 10/06/2023 SLP Individual Time: 1101-1200 SLP Individual Time Calculation (min): 59 min  Short Term Goals: Week 1: SLP Short Term Goal 1 (Week 1): Patient will demonstrate functional problem solving for basic and familair tasks with Mod verbal and visual cues. SLP Short Term Goal 1 - Progress (Week 1): Met SLP Short Term Goal 2 (Week 1): Patient will self-monitor and correct phonemic errors at the word level with Min verbal and visual cues. SLP Short Term Goal 2 - Progress (Week 1): Progressing toward goal SLP Short Term Goal 3 (Week 1): Patient will follow 2-step commands with 50% accuracy and Mod verbal cues. SLP Short Term Goal 3 - Progress (Week 1): Progressing toward goal SLP Short Term Goal 4 (Week 1): Patient will utilize multimodal communication to express basic wants/needs with Mod A multimodal cues. SLP Short Term Goal 4 - Progress (Week 1): Met SLP Short Term Goal 5 (Week 1): Patient will name functional items with 50% accuracy and Mod verbal cues. SLP Short Term Goal 5 - Progress (Week 1): Met    New Short Term Goals: Week 2: SLP Short Term Goal 1 (Week 2): Patient will demonstrate functional problem solving for basic and familair tasks with Min verbal and visual cues. SLP Short Term Goal 2 (Week 2): Patient will self-monitor and correct phonemic errors at the word level with Min verbal and visual cues. SLP Short Term Goal 3 (Week 2): Patient will follow 2-step commands with 50% accuracy and Mod verbal cues. SLP Short Term Goal 4 (Week 2): Patient will utilize multimodal communication to express basic wants/needs with Min A multimodal cues. SLP Short Term Goal 5 (Week 2): Patient will name functional items with 70%  accuracy and Mod verbal cues.  Weekly Progress Updates: Pt has made good gains and has met 3 of 5 STGs this reporting period due to improved communication and cognition. Currently, patient continues to require mod A for functional problem solving and at least modA during communicative tasks. Patient with increased ability to communicate wants/needs as well as name functional items, however continues to require at least modA to demonstrate awareness of mistakes and follow commands.  Pt/family eduction ongoing. Pt would benefit from continued ST intervention to maximize cognition and communication in order to maximize functional independence at d/c.   Intensity: Minumum of 1-2 x/day, 30 to 90 minutes Frequency: 3 to 5 out of 7 days Duration/Length of Stay: 10/30 Treatment/Interventions: Cognitive remediation/compensation;Cueing hierarchy;Environmental controls;Functional tasks;Internal/external aids;Multimodal communication approach;Patient/family education;Therapeutic Activities   Daily Session  Skilled Therapeutic Interventions:  Skilled therapy session focused on cognitive and communicative goals. SLP facilitated session by providing modA during counting coin and utilizing clock task. Patient able to separate coins into amounts independently, however required increased assist to count out amounts verbalized by SLP. Patient required education during clock task regarding meaning of hand length and minutes vs hours, however completed simple time tasks with modA. SLP continued to address cognition through following command task however, patient perseverative on verbalizations therefore unable to complete directions despite maxA. SLP targeted communication through automatic speech tasks in which patient able to count 1-10 and recite days of the week independently. Patient benefited from minA during months of the year, though only requiring first letter of each month to complete  entirety of task. Patient  continues to require modA to demonstrate awareness of phonemic errors during functional activities including reciting family members names. Patient continent of bladder during session. Patient left in chair with alarm set and call bell in reach. Continue POC.       Pain Pain Assessment Pain Scale: 0-10 Pain Score: 0-No pain  Therapy/Group: Individual Therapy  Rosemae Mcquown M.A., CF-SLP 10/06/2023, 12:20 PM

## 2023-10-06 NOTE — Progress Notes (Signed)
Physical Therapy Session Note  Patient Details  Name: [name] MRN: [id number] Date of Birth: [birth year]  Today's Date: 10/06/2023 PT Individual Time: 1437-1540 PT Individual Time Calculation (min): 63 min   Short Term Goals: Week 1:  PT Short Term Goal 1 (Week 1): Pt will complete transfer min assist x1 PT Short Term Goal 2 (Week 1): Pt will complete bed mobility with min assist PT Short Term Goal 3 (Week 1): Pt will complete WC mobility with supervision 150' PT Short Term Goal 4 (Week 1): Pt will initiate gait training   Skilled Therapeutic Interventions/Progress Updates:  Patient supine in bed in L sidelying on entrance to room. Patient alert and agreeable to PT session.   Patient with no pain complaint at start of session.  Therapeutic Activity: Bed Mobility: Pt performed supine > sit with MinA for push up to seated position on EOB. Min cueing for technique. Transfers: Pt performed squat pivot transfer to L side with MinA. Pt is impulsive and requires vc for sequencing safe steps to prepare while calling attn to RUE position, scooting closer to w/c, ensuring proper brake application, positioning RLE, and then proper technique for squat pivot. Pt actually performs hybrid stand/ squat pivot to reach w/c.   Sit<>stand transfers throughout session prior to ambulation from w/c. Provided verbal cues for setup and steps to position safely. .  Gait Training:  Pt ambulated 100' x1/ 63' x1 using HW with maxA for RLE advancement. Pt is able to hold RLE in stance phase with ModA but then requires MaxA for swing phase and limb advancement.  Estim performed between bouts to assess for carryover of motor control. No improvement in quality of gait. May require increased intensity of estim for more carryover.   Applied R LE NMES to quad muscles using [place] e-stim device set on NMES for atrophy - intensity 40mA with palpable muscle contraction.  Run time . AAROM provided for LAQ  during on-time After completion of e-stim, pt denies any negative side effects with skin intact and no adverse side effects noted.  NMR performed for improvements in motor control and coordination, balance, sequencing, judgement, and self confidence/ efficacy in performing all aspects of mobility at highest level of independence.   Patient supine in bed at end of session with brakes locked, bed alarm set, and all needs within reach. BLE prosthetics doffed by pt and placed near window.    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   Therapy/Group: Individual Therapy  Loel Dubonnet PT, DPT, CSRS 10/06/2023, 6:03 PM

## 2023-10-06 NOTE — Plan of Care (Signed)
  Problem: Consults Goal: RH STROKE PATIENT EDUCATION Description: See Patient Education module for education specifics  Outcome: Progressing   Problem: RH BOWEL ELIMINATION Goal: RH STG MANAGE BOWEL WITH ASSISTANCE Description: STG Manage Bowel with toileting Assistance. Outcome: Progressing Goal: RH STG MANAGE BOWEL W/MEDICATION W/ASSISTANCE Description: STG Manage Bowel with Medication with mod I Assistance. Outcome: Progressing   Problem: RH BLADDER ELIMINATION Goal: RH STG MANAGE BLADDER WITH ASSISTANCE Description: STG Manage Bladder With toileting Assistance Outcome: Progressing Goal: RH STG MANAGE BLADDER WITH MEDICATION WITH ASSISTANCE Description: STG Manage Bladder With Medication With mod I Assistance. Outcome: Progressing   Problem: RH SAFETY Goal: RH STG ADHERE TO SAFETY PRECAUTIONS W/ASSISTANCE/DEVICE Description: STG Adhere to Safety Precautions With cues Assistance/Device. Outcome: Progressing   Problem: RH KNOWLEDGE DEFICIT Goal: RH STG INCREASE KNOWLEDGE OF DIABETES Description:  Patient and wife will be able to manage DM with medications and dietary modification using educational resources independently Outcome: Progressing Goal: RH STG INCREASE KNOWLEDGE OF HYPERTENSION Description: Patient and wife will be able to manage HTN with medications and dietary modification using educational resources independently Outcome: Progressing Goal: RH STG INCREASE KNOWLEGDE OF HYPERLIPIDEMIA Description: Patient and wife will be able to manage HLD with medications and dietary modification using educational resources independently Outcome: Progressing Goal: RH STG INCREASE KNOWLEDGE OF STROKE PROPHYLAXIS Description: Patient and wife will be able to manage Secondary stroke risks with medications and dietary modification using educational resources independently Outcome: Progressing

## 2023-10-06 NOTE — Progress Notes (Signed)
PROGRESS NOTE   Subjective/Complaints:  Remains aphasic worried about being dressed when his daughter arrives this am   ROS: Limited by aphasia.  Pertinent positives above.  Objective:   No results found. No results for input(s): "WBC", "HGB", "HCT", "PLT" in the last 72 hours.  Recent Labs    10/04/23 0456 10/05/23 0641  NA 137 135  K 4.3 4.6  CL 106 107  CO2 24 20*  GLUCOSE 119* 130*  BUN 33* 36*  CREATININE 2.47* 2.39*  CALCIUM 8.6* 8.6*    Intake/Output Summary (Last 24 hours) at 10/06/2023 0748 Last data filed at 10/06/2023 0600 Gross per 24 hour  Intake 2221.14 ml  Output 2525 ml  Net -303.86 ml        Physical Exam: Vital Signs Blood pressure 138/68, pulse 71, temperature 98 F (36.7 C), temperature source Oral, resp. rate 16, height 5\' 5"  (1.651 m), weight 79.8 kg, SpO2 98%.   General: No acute distress Mood and affect are appropriate Heart: Regular rate and rhythm no rubs murmurs or extra sounds Lungs: Clear to auscultation, breathing unlabored, no rales or wheezes Abdomen: Positive bowel sounds, soft nontender to palpation, nondistended Extremities: No clubbing, cyanosis, or edema Skin: No evidence of breakdown, no evidence of rash   + aphasia, fluent with neologisms , able to repeat, name simple object s, correctly selects "hospital" as current location  + Apraxic and right inattention.  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 RLE otherwise did not move today, also complicated by motor apraxia. LUE and LLE grossly antigravity and against resistance, 5 out of 5  decreased sense of pain RUE/RLE Musculoskeletal: residual limbs well shaped.  Bilateral BKA   Assessment/Plan: 1. Functional deficits which require 3+ hours per day of interdisciplinary therapy in a comprehensive inpatient rehab setting. Physiatrist is providing close team  supervision and 24 hour management of active medical problems listed below. Physiatrist and rehab team continue to assess barriers to discharge/monitor patient progress toward functional and medical goals  Care Tool:  Bathing    Body parts bathed by patient: Right arm, Chest, Abdomen, Front perineal area, Buttocks, Right upper leg, Left upper leg, Face   Body parts bathed by helper: Left arm Body parts n/a: Left lower leg, Right lower leg   Bathing assist Assist Level: Maximal Assistance - Patient 24 - 49%     Upper Body Dressing/Undressing Upper body dressing   What is the patient wearing?: Pull over shirt    Upper body assist Assist Level: Maximal Assistance - Patient 25 - 49%    Lower Body Dressing/Undressing Lower body dressing      What is the patient wearing?: Pants, Underwear/pull up     Lower body assist Assist for lower body dressing: Maximal Assistance - Patient 25 - 49%     Toileting Toileting    Toileting assist Assist for toileting: Maximal Assistance - Patient 25 - 49%     Transfers Chair/bed transfer  Transfers assist  Chair/bed transfer activity did not occur: Safety/medical concerns  Chair/bed transfer assist level: Minimal Assistance - Patient > 75% (squat pivot)  Locomotion Ambulation   Ambulation assist   Ambulation activity did not occur: Safety/medical concerns          Walk 10 feet activity   Assist  Walk 10 feet activity did not occur: Safety/medical concerns        Walk 50 feet activity   Assist Walk 50 feet with 2 turns activity did not occur: Safety/medical concerns         Walk 150 feet activity   Assist Walk 150 feet activity did not occur: Safety/medical concerns         Walk 10 feet on uneven surface  activity   Assist Walk 10 feet on uneven surfaces activity did not occur: Safety/medical concerns         Wheelchair     Assist Is the patient using a wheelchair?: Yes Type of  Wheelchair: Manual    Wheelchair assist level: Moderate Assistance - Patient 50 - 74% Max wheelchair distance: 150    Wheelchair 50 feet with 2 turns activity    Assist        Assist Level: Moderate Assistance - Patient 50 - 74%   Wheelchair 150 feet activity     Assist      Assist Level: Moderate Assistance - Patient 50 - 74%   Blood pressure 138/68, pulse 71, temperature 98 F (36.7 C), temperature source Oral, resp. rate 16, height 5\' 5"  (1.651 m), weight 79.8 kg, SpO2 98%.  Medical Problem List and Plan: 1. 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 2.  Antithrombotics: -DVT/anticoagulation:  Pharmaceutical: Lovenox             -antiplatelet therapy: Aspirin and Plavix for three months followed by aspirin alone (started 9/26)   3. Pain Management: Tylenol as needed   4. Mood/Behavior/Sleep: LCSW to evaluate and provide emotional support             -antipsychotic agents: n/a   5. Neuropsych/cognition: This patient is not quite capable of making decisions on his own behalf.   -language improving. -may have capacity soon.   6. Skin/Wound Care: Routine skin care checks             -Discussed with nursing good skin check of bilateral residual limbs once in bed at rehab   7. 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 Renal signed off  myfortic stopped in setting of bacteremia per his transplant center-- will need to be restarted at previous dose at d/c              Start IVF 10/9- recheck in am  13: GERD: continue PPI BID   14: DM s/p pancreas transplant: CBGs QID; A1c = 5.8% (no home meds listed)             -discontinue SSI                  -started carb modified diet- pt refuses now on reg diet (same as home)    - well controlled, continue current regimen 10/10 Recent Labs    10/03/23 1616 10/03/23 2107 10/04/23 0631  GLUCAP 149* 143* 117*     15: Nausea: resolved -discontinue Reglan -continue PPI -Zofran prn   15: UTI: E. coli isolated: continue ceftriaxone 2 grams q 24 hours (day #4)             -completed abx--observe   16: Urinary retention: purewick in place             10/6-continue PVRs thru today perhaps, in and out cath PRN   -pvr's all lesss than 160cc now, continent  -eob to void when possible  -terazosin now at 4 mg at bedtime   -             17: Hypokalemia: repleted; follow-up BMP 10-8 resolved  with daily supplementation    18.  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

## 2023-10-07 ENCOUNTER — Inpatient Hospital Stay (HOSPITAL_COMMUNITY): Payer: 59

## 2023-10-07 DIAGNOSIS — I639 Cerebral infarction, unspecified: Secondary | ICD-10-CM | POA: Diagnosis not present

## 2023-10-07 DIAGNOSIS — I10 Essential (primary) hypertension: Secondary | ICD-10-CM | POA: Diagnosis not present

## 2023-10-07 NOTE — Clinical Note (Incomplete)
Neurology TEAM PROGRESS NOTE    BRIEF HPI [name] is a 47 y.o. 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  Patient was given IM epinephrine on ED arrival with improvement in neurologic status as patient was able to open his eyes and move all of his extremities but remained unable to speak.  Due to observed facial droop, a code stroke was activated.  Presentation felt to be concerning for possible vasodilatory shock/anaphylactic shock due to allergic reaction versus anaphylaxis to immunosuppressive infusion.  Patient also recently admitted 9/9 with findings of an E. coli UTI and poorly controlled hypertension with discharge on 09/06/2023.   SIGNIFICANT HOSPITAL EVENTS MRI brain with scattered small foci of acute subcortical infarction in the left occipital lobe and posterior left frontal lobe.  Faint diffusion signal abnormality along the left parietal lobe may reflect evolving infarct versus seizure related cytotoxic edema.  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  He was discharged to rehab on 10/5   INTERIM HISTORY/SUBJECTIVE Patient is lying comfortably in bed.  His wife is at the bedside.  She confirms that his neurological status is stable at this time and improved from last neurology evaluation on 9/26  Unfortunately he had an unwitnessed fall and was discovered shortly after shift change on the floor.  Wife reports that with his aphasia the best she was able to understand from him is that he was attempting to move himself from the chair back to the bed.  Head CT was obtained and given the findings as documented below and reviewed personally by myself, neurology was  consulted for further recommendations   OBJECTIVE    Basic Metabolic Panel: Recent Labs  Lab 10/01/23 0801 10/02/23 0648 10/04/23 0456 10/05/23 0641  NA 137 135 137 135  K 3.8 3.6 4.3 4.6  CL 106 105 106 107  CO2 21* 20* 24 20*  GLUCOSE 128* 126* 119* 130*  BUN 17 18 33* 36*  CREATININE 2.45* 2.29* 2.47* 2.39*  CALCIUM 8.3* 8.4* 8.6* 8.6*  PHOS 3.8  --   --   --     CBC: Recent Labs  Lab 10/01/23 0801 10/02/23 0648  WBC 8.0 7.3  HGB 12.9* 13.0  HCT 40.8 41.3  MCV 95.6 95.4  PLT 409* 477*    Coagulation Studies: No results for input(s): "LABPROT", "INR" in the last 72 hours.   IMAGING past 24 hours No results found.   Current vital signs: BP 131/62 (BP Location: Left Arm)   Pulse 82   Temp 98.6 F (37 C) (Oral)   Resp 18   Ht 5\' 5"  (1.651 m)   Wt 79.8 kg   SpO2 97%   BMI 29.28 kg/m  Vital signs in last 24 hours: Temp:  [97.9 F (36.6 C)-98.6 F (37 C)] 98.6 F (37 C) (10/12 2112) Pulse Rate:  [70-85] 82 (10/12 2338) Resp:  [16-18] 18 (10/12 2338) BP: (131-172)/(62-77) 131/62 (10/12 2338) SpO2:  [97 %-100 %] 97 % (10/12 2338)  Blood pressure spiked only immediately after the fall, since then it has been stable and below 150/80  PHYSICAL EXAM General: Frail ill appearing African-American male not in distress Psych: Slow to respond CV: Regular rate and rhythm on monitor Respiratory: Breathing comfortably GI:  Abdomen soft and nontender   NEURO:  Mental Status: Patient is awake and interactive.  Speech is hesitant slow but clear, significant aphasia but answers most yes/no questions appropriately.  Follows simple commands readily.  No neglect     Cranial Nerves:  II: PERRL III, IV, VI: Patient fixates and tracks examiner throughout V: Corneals intact VII: Right facial droop  VIII: Hearing is intact to voice. IX, X: Cough and gag reflexes are intact XI: Head is grossly midline XII: Does not protrude tongue Motor: Right upper extremity hand  is 2-3/5 for grip, 2/5 for elbow flexion, 0/5 for elbow extension.  Left upper extremity no pronator drift, 5/5 Sensation: Reduced sensation on the right face arm and leg Coordination: Intact in the left upper extremity Gait: Deferred given bilateral leg amputations  Head CT personally reviewed, agree with radiology:   Evidence for new evolving acute left MCA territory infarct involving the left frontal and parietal lobes. Superimposed patchy small volume hemorrhage, most pronounced at the left frontal operculum. No significant mass effect.  ASSESSMENT/PLAN   Although imaging does reveal progressive stroke since his last imaging, examination has actually improved as confirmed by patient, family and primary team.  In this setting further emergent imaging such as CTA/CT perfusion is not indicated overnight.  Unclear exact timing of the hemorrhage but given that he is not persistently hypertensive and otherwise doing well, I do not suspect he is at high risk of hemorrhage expansion, and it is quite possible that the hemorrhage may have occurred earlier in his stay.  Wife agrees that she would prefer patient to stay in rehab rather than be transferred to higher acuity setting for closer monitoring, and nursing team reports they are able to monitor blood pressure every hour.  If repeat head CT is stable, patient will stay at current level of care overnight.  Unclear etiology of his fall as he typically would be assisted to the bed.  Given his age and critical nature of his stroke he is at risk of developing seizures so we will also obtain EEG  Recommendations: -Hold antiplatelet agents and Lovenox for now; will not be able to do SCDs due to his amputations -Repeat head CT at 4 AM to confirm stability of hemorrhage -Every 1 hour blood pressure checks overnight, nursing will notify me of his blood pressure checks via secure chat -Routine EEG -Partner at bedside will notify nursing if there is any  sudden change in his neurological status overnight -Stroke team will follow-up tomorrow  Brooke Dare MD-PhD [facility] [phone]

## 2023-10-07 NOTE — Progress Notes (Signed)
Neurology TEAM PROGRESS NOTE    BRIEF HPI [name] is a 47 y.o. 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  Patient was given IM epinephrine on ED arrival with improvement in neurologic status as patient was able to open his eyes and move all of his extremities but remained unable to speak.  Due to observed facial droop, a code stroke was activated.  Presentation felt to be concerning for possible vasodilatory shock/anaphylactic shock due to allergic reaction versus anaphylaxis to immunosuppressive infusion.  Patient also recently admitted 9/9 with findings of an E. coli UTI and poorly controlled hypertension with discharge on 09/06/2023.   SIGNIFICANT HOSPITAL EVENTS MRI brain with scattered small foci of acute subcortical infarction in the left occipital lobe and posterior left frontal lobe.  Faint diffusion signal abnormality along the left parietal lobe may reflect evolving infarct versus seizure related cytotoxic edema.  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  He was discharged to rehab on 10/5   INTERIM HISTORY/SUBJECTIVE Patient is lying comfortably in bed.  His wife is at the bedside.  She confirms that his neurological status is stable at this time and improved from last neurology evaluation on 9/26  Unfortunately he had an unwitnessed fall and was discovered shortly after shift change on the floor.  Wife reports that with his aphasia the best she was able to understand from him is that he was attempting to move himself from the chair back to the bed.  Head CT was obtained and given the findings as documented below and reviewed personally by myself, neurology was  consulted for further recommendations   OBJECTIVE    Basic Metabolic Panel: Recent Labs  Lab 10/01/23 0801 10/02/23 0648 10/04/23 0456 10/05/23 0641  NA 137 135 137 135  K 3.8 3.6 4.3 4.6  CL 106 105 106 107  CO2 21* 20* 24 20*  GLUCOSE 128* 126* 119* 130*  BUN 17 18 33* 36*  CREATININE 2.45* 2.29* 2.47* 2.39*  CALCIUM 8.3* 8.4* 8.6* 8.6*  PHOS 3.8  --   --   --     CBC: Recent Labs  Lab 10/01/23 0801 10/02/23 0648  WBC 8.0 7.3  HGB 12.9* 13.0  HCT 40.8 41.3  MCV 95.6 95.4  PLT 409* 477*    Coagulation Studies: No results for input(s): "LABPROT", "INR" in the last 72 hours.   IMAGING past 24 hours No results found.   Current vital signs: BP 131/62 (BP Location: Left Arm)   Pulse 82   Temp 98.6 F (37 C) (Oral)   Resp 18   Ht 5\' 5"  (1.651 m)   Wt 79.8 kg   SpO2 97%   BMI 29.28 kg/m  Vital signs in last 24 hours: Temp:  [97.9 F (36.6 C)-98.6 F (37 C)] 98.6 F (37 C) (10/12 2112) Pulse Rate:  [70-85] 82 (10/12 2338) Resp:  [16-18] 18 (10/12 2338) BP: (131-172)/(62-77) 131/62 (10/12 2338) SpO2:  [97 %-100 %] 97 % (10/12 2338)  Blood pressure spiked only immediately after the fall, since then it has been stable and below 150/80  PHYSICAL EXAM General: Frail ill appearing African-American male not in distress Psych: Slow to respond CV: Regular rate and rhythm on monitor Respiratory: Breathing comfortably GI:  Abdomen soft and nontender   NEURO:  Mental Status: Patient is awake and interactive.  Speech is hesitant slow but clear, significant aphasia but answers most yes/no questions appropriately.  Follows simple commands readily.  No neglect     Cranial Nerves:  II: PERRL III, IV, VI: Patient fixates and tracks examiner throughout V: Corneals intact VII: Right facial droop  VIII: Hearing is intact to voice. IX, X: Cough and gag reflexes are intact XI: Head is grossly midline XII: Does not protrude tongue Motor: Right upper extremity hand  is 2-3/5 for grip, 2/5 for elbow flexion, 0/5 for elbow extension.  Left upper extremity no pronator drift, 5/5 Sensation: Reduced sensation on the right face arm and leg Coordination: Intact in the left upper extremity Gait: Deferred given bilateral leg amputations  Head CT personally reviewed, agree with radiology:   Evidence for new evolving acute left MCA territory infarct involving the left frontal and parietal lobes. Superimposed patchy small volume hemorrhage, most pronounced at the left frontal operculum. No significant mass effect.  ASSESSMENT/PLAN   Although imaging does reveal progressive stroke since his last imaging, examination has actually improved as confirmed by patient, family and primary team.  In this setting further emergent imaging such as CTA/CT perfusion is not indicated overnight.  Unclear exact timing of the hemorrhage but given that he is not persistently hypertensive and otherwise doing well, I do not suspect he is at high risk of hemorrhage expansion, and it is quite possible that the hemorrhage may have occurred earlier in his stay.  Wife agrees that she would prefer patient to stay in rehab rather than be transferred to higher acuity setting for closer monitoring, and nursing team reports they are able to monitor blood pressure every hour.  If repeat head CT is stable, patient will stay at current level of care overnight.  Unclear etiology of his fall as he typically would be assisted to the bed.  Given his age and critical nature of his stroke he is at risk of developing seizures so will also obtain EEG  Recommendations: -Hold antiplatelet agents and Lovenox for now; will not be able to do SCDs due to his amputations -Repeat head CT at 4 AM to confirm stability of hemorrhage -Every 1 hour blood pressure checks overnight, nursing will notify me of his blood pressure checks via secure chat -Routine EEG -Partner at bedside will notify nursing if there is any sudden  change in his neurological status overnight -Stroke team will follow-up tomorrow  Brooke Dare MD-PhD [facility] [phone] Available 7 PM to 7 AM, outside of these hours please call Neurologist on call as listed on Amion.

## 2023-10-07 NOTE — Progress Notes (Signed)
Patient was found on floor during shift change by day time tech. On call PA France Ravens called and made aware of unassisted fall. CT of head of ordered per PA Mercedes. Post unassisted fall vital signs initiated. Patient has refused some of the post fall vital signs. CT results were called by telephone by Radiology to PA [facility]. Patient in bed with wife at bedside and call bell within reach.

## 2023-10-07 NOTE — Progress Notes (Signed)
PROGRESS NOTE   Subjective/Complaints:  Pt doing alright, slept well, denies pain, LBM this morning, urinating fine. Denies any other complaints or concerns today.   ROS: Limited by aphasia.  Pertinent positives above.  Objective:   No results found. No results for input(s): "WBC", "HGB", "HCT", "PLT" in the last 72 hours.  Recent Labs    10/05/23 0641  NA 135  K 4.6  CL 107  CO2 20*  GLUCOSE 130*  BUN 36*  CREATININE 2.39*  CALCIUM 8.6*    Intake/Output Summary (Last 24 hours) at 10/07/2023 1056 Last data filed at 10/07/2023 0735 Gross per 24 hour  Intake 960 ml  Output 1150 ml  Net -190 ml        Physical Exam: Vital Signs Blood pressure 132/72, pulse 76, temperature 97.9 F (36.6 C), resp. rate 18, height 5\' 5"  (1.651 m), weight 79.8 kg, SpO2 99%.   General: No acute distress, layingi n bed.  Mood and affect are appropriate Heart: Regular rate and rhythm no rubs murmurs or extra sounds Lungs: Clear to auscultation, breathing unlabored, no rales or wheezes Abdomen: Positive bowel sounds, soft nontender to palpation, nondistended Extremities: No clubbing, cyanosis, or edema. B/L BKAs.  Skin: No evidence of breakdown, no evidence of rash  PRIOR EXAMS: + aphasia, fluent with neologisms , able to repeat, name simple object s, correctly selects "hospital" as current location  + Apraxic and right inattention.  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 RLE otherwise did not move today, also complicated by motor apraxia. LUE and LLE grossly antigravity and against resistance, 5 out of 5  decreased sense of pain RUE/RLE Musculoskeletal: residual limbs well shaped.  Bilateral BKA   Assessment/Plan: 1. Functional deficits which require 3+ hours per day of interdisciplinary therapy in a comprehensive inpatient rehab setting. Physiatrist is providing close  team supervision and 24 hour management of active medical problems listed below. Physiatrist and rehab team continue to assess barriers to discharge/monitor patient progress toward functional and medical goals  Care Tool:  Bathing    Body parts bathed by patient: Right arm, Chest, Abdomen, Front perineal area, Buttocks, Right upper leg, Left upper leg, Face   Body parts bathed by helper: Left arm Body parts n/a: Left lower leg, Right lower leg   Bathing assist Assist Level: Maximal Assistance - Patient 24 - 49%     Upper Body Dressing/Undressing Upper body dressing   What is the patient wearing?: Pull over shirt    Upper body assist Assist Level: Maximal Assistance - Patient 25 - 49%    Lower Body Dressing/Undressing Lower body dressing      What is the patient wearing?: Pants, Underwear/pull up     Lower body assist Assist for lower body dressing: Maximal Assistance - Patient 25 - 49%     Toileting Toileting    Toileting assist Assist for toileting: Maximal Assistance - Patient 25 - 49%     Transfers Chair/bed transfer  Transfers assist  Chair/bed transfer activity did not occur: Safety/medical concerns  Chair/bed transfer assist level: Minimal Assistance - Patient > 75% (squat pivot)  Locomotion Ambulation   Ambulation assist   Ambulation activity did not occur: Safety/medical concerns          Walk 10 feet activity   Assist  Walk 10 feet activity did not occur: Safety/medical concerns        Walk 50 feet activity   Assist Walk 50 feet with 2 turns activity did not occur: Safety/medical concerns         Walk 150 feet activity   Assist Walk 150 feet activity did not occur: Safety/medical concerns         Walk 10 feet on uneven surface  activity   Assist Walk 10 feet on uneven surfaces activity did not occur: Safety/medical concerns         Wheelchair     Assist Is the patient using a wheelchair?: Yes Type of  Wheelchair: Manual    Wheelchair assist level: Moderate Assistance - Patient 50 - 74% Max wheelchair distance: 150    Wheelchair 50 feet with 2 turns activity    Assist        Assist Level: Moderate Assistance - Patient 50 - 74%   Wheelchair 150 feet activity     Assist      Assist Level: Moderate Assistance - Patient 50 - 74%   Blood pressure 132/72, pulse 76, temperature 97.9 F (36.6 C), resp. rate 18, height 5\' 5"  (1.651 m), weight 79.8 kg, SpO2 99%.  Medical Problem List and Plan: 1. 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 2.  Antithrombotics: -DVT/anticoagulation:  Pharmaceutical: Lovenox 40mg  daily -antiplatelet therapy: Aspirin and Plavix for three months followed by aspirin alone (started 9/26)   3. Pain Management: Tylenol as needed   4. Mood/Behavior/Sleep: LCSW to evaluate and provide emotional support             -antipsychotic agents: n/a   -Melatonin 5mg  at bedtime  5. Neuropsych/cognition: This patient is not quite capable of making decisions on his own behalf.   -language improving. -may have capacity soon.   6. Skin/Wound Care: Routine skin care checks -Discussed with nursing good skin check of bilateral residual limbs once in bed at rehab   7. Fluids/Electrolytes/Nutrition: strict Is and Os and follow-up chemistries   8: Hypertension: monitor TID and prn             -continue hydralazine 50 mg BID             -continue carvedilol 6.25 mg BID             -continue terazosin 4 mg q HS   -10/7: Increase Hydralazine to 50  mg TID -10/11 good  control , improving   -10/07/23 BPs looking good, cont regimen Vitals:   10/03/23 1937 10/04/23 0503 10/04/23 1309 10/04/23 1928  BP: (!) 166/72 (!) 140/72 (!) 125/59 (!) 116/55   10/05/23 0447 10/05/23 0846 10/05/23 1533 10/05/23 1942  BP: (!) 145/74 (!) 156/69 (!) 142/54 (!) 152/65   10/06/23 0449 10/06/23 1308 10/06/23 1936 10/07/23 0502  BP: 138/68 [name])  121/57 137/66 132/72      9: Hyperlipidemia: continue rosuvastatin 20mg  daily   10: 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 Renal signed off -myfortic stopped in setting of bacteremia per his transplant center-- will need to be restarted at previous dose at d/c              -Start IVF 10/9- recheck in am   -10/07/23 Cr 2.39 yesterday; monitor with routine labs 13: GERD: continue protonix 40mg  BID   14: DM s/p pancreas transplant: CBGs QID; A1c = 5.8% (no home meds listed)             -discontinue SSI             -started carb modified diet- pt refuses now on reg diet (same as home)    - well controlled, continue current regimen 10/10  15: Nausea: resolved -discontinue Reglan -continue PPI -Zofran prn   15: UTI: E. coli isolated: continue ceftriaxone 2 grams q 24 hours (day #4)             -completed abx--observe   16: Urinary retention: purewick in place             -10/6-continue PVRs thru today perhaps, in and out cath PRN   -pvr's all  lesss than 160cc now, continent  -eob to void when possible  -terazosin now at 4 mg at bedtime               17: Hypokalemia: repleted; follow-up BMP -10-8 resolved with daily supplementation daily   18.  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 10/07/2023, 10:56 AM

## 2023-10-07 NOTE — Progress Notes (Signed)
Speech Language Pathology Daily Session Note  Patient Details  Name: RUFORD DUDZINSKI MRN: [id number] Date of Birth: [birth year]  Today's Date: 10/07/2023 SLP Individual Time: 1445-1530 SLP Individual Time Calculation (min): 45 min  Short Term Goals: Week 2: SLP Short Term Goal 1 (Week 2): Patient will demonstrate functional problem solving for basic and familair tasks with Min verbal and visual cues. SLP Short Term Goal 2 (Week 2): Patient will self-monitor and correct phonemic errors at the word level with Min verbal and visual cues. SLP Short Term Goal 3 (Week 2): Patient will follow 2-step commands with 50% accuracy and Mod verbal cues. SLP Short Term Goal 4 (Week 2): Patient will utilize multimodal communication to express basic wants/needs with Min A multimodal cues. SLP Short Term Goal 5 (Week 2): Patient will name functional items with 70% accuracy and Mod verbal cues.  Skilled Therapeutic Interventions: Skilled therapy session focused on cognitive and communicative goals. SLP and patient visited hospital gift shop to locate items on list created prior session. Patient navigated gift shop to locate items with minA. After location of items, patient chose appropriate items to "buy" for himself, wife, child, and baby. SLP encouraged communication through labeling items in shop. Patient utilized item names to aid in communication regarding what snack, drink, and candy he would buy. Patient able to communicate wants/needs this session including willingness to go to gift shop and back to room with modA. Patient left in room with alarm set and call bell in reach. Continue POC.   Pain Pain Assessment Pain Scale: 0-10 Pain Score: 0-No pain  Therapy/Group: Individual Therapy  Jeris Easterly M.A., CF-SLP 10/07/2023, 3:35 PM

## 2023-10-07 NOTE — Progress Notes (Signed)
Occupational Therapy Session Note  Patient Details  Name: [name] MRN: [id number] Date of Birth: Oct 09, 1976  Today's Date: 10/07/2023 OT Individual Time: 1120-1205 (unattended estim 1205-1300 = 55 min) OT Individual Time Calculation (min): 45 min    Short Term Goals: Week 1:  OT Short Term Goal 1 (Week 1): Patient will complete toilet transfer with mod A of 1 OT Short Term Goal 1 - Progress (Week 1): Met OT Short Term Goal 2 (Week 1): Patient will recall hemi-dressing techniques with min cues OT Short Term Goal 2 - Progress (Week 1): Progressing toward goal OT Short Term Goal 3 (Week 1): Patient will complete 1 step of LB dressing task OT Short Term Goal 3 - Progress (Week 1): Met Week 2:  OT Short Term Goal 1 (Week 2): Pt will don a t shirt with min A demonstrating improved motor planning and R side awareness. OT Short Term Goal 2 (Week 2): Pt will complete squat pivot transfers to toilet and or BSC with min A. OT Short Term Goal 3 (Week 2): Pt will be able to use R hand to grasp wash cloth while helper assists moving arm to wash his L arm  Skilled Therapeutic Interventions/Progress Updates:    Pt received in w/c ready for therapy.  Focus of therapy session on RUE NMR.  Pt dressed and ready for the day.  From sitting upright in w/c, pt used UE ranger with hand over hand guiding to facilitate arm reaching to stimulate shoulder and elbow AROM.  Pt was able to elicit some active shoulder flexion (only a few degrees of range). To assist with triceps,  applied estim using saebo stim one unit for 12 minutes as pt worked on a/arom of elbow extension to reach toward targets.  His wife arrived.  Pt then practiced active grasp and release with wrist extension of picking up water bottle with assisted guiding to bring to mouth for functional movement task.  Showed her how she can also practice these activities with him.  Pt can also do active wrist extension and needs to work on this  strength to be able to hold walker handle with more control.   Discussed idea of a chair lift for home to enable him to access his room and bathroom. Pt had previously sat in a bathtub but will need to use a tub bench if he is able to access upstairs.  Hopefully we can schedule a time to do a shower towards the end of the week once pt is able to transfer more efficiently.   Applied saebo stim one to shoulder for cyclic estim unattended for one hour.  Pt tolerated estim well.   Pt resting in w/c with all needs met. Alarm set and call light in reach.        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), R hemi, avoid hypotension Restrictions Weight Bearing Restrictions: No      Pain: Pain Assessment Pain Scale: 0-10 Pain Score: 0-No pain ADL: ADL Eating: Set up Grooming: Setup Upper Body Bathing: Minimal assistance Lower Body Bathing: Minimal assistance Where Assessed-Lower Body Bathing: Bed level Upper Body Dressing: Moderate assistance Where Assessed-Upper Body Dressing: Edge of bed Lower Body Dressing: Moderate assistance Where Assessed-Lower Body Dressing: Bed level Toileting: Maximal assistance Toilet Transfer: Maximal assistance   Therapy/Group: Individual Therapy  Shawneequa Baldridge 10/07/2023, 12:25 PM

## 2023-10-07 NOTE — Progress Notes (Signed)
Speech Language Pathology Daily Session Note  Patient Details  Name: [name] MRN: [id number] Date of Birth: 06-Aug-1976  Today's Date: 10/07/2023 SLP Individual Time: 1300-1345 SLP Individual Time Calculation (min): 45 min  Short Term Goals: Week 2: SLP Short Term Goal 1 (Week 2): Patient will demonstrate functional problem solving for basic and familair tasks with Min verbal and visual cues. SLP Short Term Goal 2 (Week 2): Patient will self-monitor and correct phonemic errors at the word level with Min verbal and visual cues. SLP Short Term Goal 3 (Week 2): Patient will follow 2-step commands with 50% accuracy and Mod verbal cues. SLP Short Term Goal 4 (Week 2): Patient will utilize multimodal communication to express basic wants/needs with Min A multimodal cues. SLP Short Term Goal 5 (Week 2): Patient will name functional items with 70% accuracy and Mod verbal cues.  Skilled Therapeutic Interventions: Skilled therapy session focused on cognitive goals. SLP faciliated session by providing minA during abstract game board task. Patient completed game with minimal A occasionally requiring cues to review enture board before making decision and rememberiong which color item was his vs SLPs. SLP continued to address session by encouraging patient to list items which he thought may be in a hospital gift shop. Patient listed appropriate items with modA. Plan to go to gift shop next session to complete confrontational naming tasks in a functional setting. Patient left in [facility] with alarm set and call bell in reach. Continue POC.   Pain Pain Assessment Pain Scale: 0-10 Pain Score: 0-No pain  Therapy/Group: Individual Therapy  Shakeema Lippman M.A., CF-SLP 10/07/2023, 2:38 PM

## 2023-10-07 NOTE — Progress Notes (Signed)
Physical Therapy Session Note  Patient Details  Name: [name] MRN: [id number] Date of Birth: 13-May-1976  Today's Date: 10/07/2023 PT Individual Time: 0925-1030 PT Individual Time Calculation (min): 65 min   Short Term Goals: Week 1:  PT Short Term Goal 1 (Week 1): Pt will complete transfer min assist x1 PT Short Term Goal 2 (Week 1): Pt will complete bed mobility with min assist PT Short Term Goal 3 (Week 1): Pt will complete WC mobility with supervision 150' PT Short Term Goal 4 (Week 1): Pt will initiate gait training   Skilled Therapeutic Interventions/Progress Updates:  Patient seated upright in w/c on entrance to room. Pt's wife present and is already dressed and ready for therapy. Patient alert and agreeable to PT session.   Patient with no pain complaint at start of session.  Therapeutic Activity: While seated, pt has itch on end of R residual limb and removes leg and silicone sleeve to scratch. On attempting to re-don, pt with difficulty and requires 3 repositions of silicone sleeve for proper pin placement. Pt frustrated but wife is able to provide gentle encouragement to calm. With MinA, pt is able to don R prosthetic.  Transfers: Pt performed sit<>stand and stand pivot transfers throughout session with good initiation and requires Min/ ModA for power up to [facility]. With HW, requires vc for L lean into HW for improved balance in stance.  Provided verbal cues for technique as needed throughout session.  Gait Training:  Pt ambulated 120' x1/ 39' x1/ 100' x1 using HW with MaxA for RLE advancement. Pt is able to hold RLE in stance phase with MinA but then requires MaxA for swing phase and limbe advancement. After first bout, pt provided with visual demonstration for need of lean into HW and then use of trunk to advance R hip forward with slight hip hike to improved ease of advancement. Pt is able to perform correctly with vc throughout. Increased ease of MaxA limb advancement.  Pt should be able to progress to self advancement soon with increased hip flexor motor control. Estim to resume on Monday.   Wife provides w/c follow throughout.   NMR performed for improvements in motor control and coordination, balance, sequencing, judgement, and self confidence/ efficacy in performing all aspects of mobility at highest level of independence.   Patient seated upright in w/c at end of session with brakes locked, belt alarm set, and all needs within reach.  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), R hemi, avoid hypotension Restrictions Weight Bearing Restrictions: No  Pain:  No pain related this session  Therapy/Group: Individual Therapy  Loel Dubonnet PT, DPT, CSRS 10/07/2023, 9:20 AM

## 2023-10-07 NOTE — Progress Notes (Signed)
Patient reported that he did not receive a tray. Nurse called dietary supervisor and she stated that patient stated to not send anymore trays. Patient made aware. Cletis Media, RN

## 2023-10-07 NOTE — Plan of Care (Signed)
  Problem: Consults Goal: RH STROKE PATIENT EDUCATION Description: See Patient Education module for education specifics  Outcome: Progressing   Problem: RH BOWEL ELIMINATION Goal: RH STG MANAGE BOWEL WITH ASSISTANCE Description: STG Manage Bowel with toileting Assistance. Outcome: Progressing Goal: RH STG MANAGE BOWEL W/MEDICATION W/ASSISTANCE Description: STG Manage Bowel with Medication with mod I Assistance. Outcome: Progressing   Problem: RH BLADDER ELIMINATION Goal: RH STG MANAGE BLADDER WITH ASSISTANCE Description: STG Manage Bladder With toileting Assistance Outcome: Progressing Goal: RH STG MANAGE BLADDER WITH MEDICATION WITH ASSISTANCE Description: STG Manage Bladder With Medication With mod I Assistance. Outcome: Progressing   Problem: RH SAFETY Goal: RH STG ADHERE TO SAFETY PRECAUTIONS W/ASSISTANCE/DEVICE Description: STG Adhere to Safety Precautions With cues Assistance/Device. Outcome: Progressing   Problem: RH KNOWLEDGE DEFICIT Goal: RH STG INCREASE KNOWLEDGE OF DIABETES Description:  Patient and wife will be able to manage DM with medications and dietary modification using educational resources independently Outcome: Progressing Goal: RH STG INCREASE KNOWLEDGE OF HYPERTENSION Description: Patient and wife will be able to manage HTN with medications and dietary modification using educational resources independently Outcome: Progressing Goal: RH STG INCREASE KNOWLEGDE OF HYPERLIPIDEMIA Description: Patient and wife will be able to manage HLD with medications and dietary modification using educational resources independently Outcome: Progressing Goal: RH STG INCREASE KNOWLEDGE OF STROKE PROPHYLAXIS Description: Patient and wife will be able to manage Secondary stroke risks with medications and dietary modification using educational resources independently Outcome: Progressing

## 2023-10-08 ENCOUNTER — Inpatient Hospital Stay (HOSPITAL_COMMUNITY): Payer: 59

## 2023-10-08 DIAGNOSIS — R569 Unspecified convulsions: Secondary | ICD-10-CM

## 2023-10-08 DIAGNOSIS — I10 Essential (primary) hypertension: Secondary | ICD-10-CM | POA: Diagnosis not present

## 2023-10-08 DIAGNOSIS — I639 Cerebral infarction, unspecified: Secondary | ICD-10-CM | POA: Diagnosis not present

## 2023-10-08 LAB — CBC
HCT: 39.9 % (ref 39.0–52.0)
Hemoglobin: 12.5 g/dL — ABNORMAL LOW (ref 13.0–17.0)
MCH: 30.6 pg (ref 26.0–34.0)
MCHC: 31.3 g/dL (ref 30.0–36.0)
MCV: 97.6 fL (ref 80.0–100.0)
Platelets: 495 10*3/uL — ABNORMAL HIGH (ref 150–400)
RBC: 4.09 MIL/uL — ABNORMAL LOW (ref 4.22–5.81)
RDW: 16.7 % — ABNORMAL HIGH (ref 11.5–15.5)
WBC: 7.7 10*3/uL (ref 4.0–10.5)
nRBC: 0 % (ref 0.0–0.2)

## 2023-10-08 MED ORDER — HYDRALAZINE HCL 50 MG PO TABS
50.0000 mg | ORAL_TABLET | Freq: Two times a day (BID) | ORAL | Status: DC
  Administered 2023-10-08 – 2023-10-19 (×22): 50 mg via ORAL
  Filled 2023-10-08 (×23): qty 1

## 2023-10-08 MED ORDER — ASPIRIN 81 MG PO TBEC
81.0000 mg | DELAYED_RELEASE_TABLET | Freq: Every day | ORAL | Status: DC
  Administered 2023-10-08 – 2023-10-19 (×12): 81 mg via ORAL
  Filled 2023-10-08 (×13): qty 1

## 2023-10-08 MED ORDER — ENOXAPARIN SODIUM 40 MG/0.4ML IJ SOSY
40.0000 mg | PREFILLED_SYRINGE | INTRAMUSCULAR | Status: DC
  Administered 2023-10-08: 40 mg via SUBCUTANEOUS
  Filled 2023-10-08: qty 0.4

## 2023-10-08 NOTE — Progress Notes (Addendum)
STROKE TEAM PROGRESS NOTE   BRIEF HPI [name] is a 47 y.o. 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 Due to observed facial droop, a code stroke was activated.  MRI brain with scattered small foci of acute subcortical infarction in the left occipital lobe and posterior left frontal lobe. Faint diffusion signal abnormality along the left parietal lobe may reflect evolving infarct versus seizure related cytotoxic edema. 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 Discharged to rehab 10/5.  SIGNIFICANT HOSPITAL EVENTS 10/12: unwitnessed fall. CTH shows evolving acute left frontal/parietal infarct, small volume hemorrhage.  10/13: Repeat CTH: stable, hemorrhage appears subacute  INTERIM HISTORY/SUBJECTIVE  Wife at bedside. Assessment and plan of care reviewed.   On exam, patient has continued expressive aphasia with paraphasic errors, left gaze preference, follows commands, moving all extremities spontaneously.  Ok for aspirin at this time. Will hold Plavix until stable repeat CT in 3-5 days.    OBJECTIVE  CBC    Component Value Date/Time   WBC 7.3 10/02/2023 0648   RBC 4.33 10/02/2023 0648   HGB 13.0 10/02/2023 0648   HCT 41.3 10/02/2023 0648   PLT 477 (H) 10/02/2023 0648   MCV 95.4 10/02/2023 0648   MCH 30.0 10/02/2023 0648   MCHC 31.5 10/02/2023 0648   RDW 16.2 (H) 10/02/2023 0648   LYMPHSABS 1.2 09/30/2023 1010   MONOABS 1.0 09/30/2023 1010   EOSABS 0.2 09/30/2023 1010   BASOSABS 0.1 09/30/2023 1010    BMET    Component Value Date/Time   NA 135 10/05/2023 0641   NA 139 09/26/2021 0000   K 4.6 10/05/2023 0641   CL 107 10/05/2023 0641   CO2 20  (L) 10/05/2023 0641   GLUCOSE 130 (H) 10/05/2023 0641   BUN 36 (H) 10/05/2023 0641   BUN 21 09/26/2021 0000   CREATININE 2.39 (H) 10/05/2023 0641   CREATININE 1.20 05/30/2014 1415   CALCIUM 8.6 (L) 10/05/2023 0641   CALCIUM 8.4 09/08/2011 0746   EGFR 42.0 09/12/2023 0000   EGFR 42 09/12/2023 0000   GFRNONAA 33 (L) 10/05/2023 0641    IMAGING past 24 hours CT HEAD WO CONTRAST ( )  Result Date: 10/08/2023 CLINICAL DATA:  47 year old male with scattered small left hemisphere infarcts on brain MRI last month, left MCA infarct petechial hemorrhage on head CT yesterday after fall. Subsequent encounter. EXAM: CT HEAD WITHOUT CONTRAST TECHNIQUE: Contiguous axial images were obtained from the base of the skull through the vertex without intravenous contrast. RADIATION DOSE REDUCTION: This exam was performed according to the departmental dose-optimization program which includes automated exposure control, adjustment of the mA and/or kV according to patient size and/or use of iterative reconstruction technique. COMPARISON:  Head CT 2144 hours yesterday FINDINGS: Brain: Stable small roughly 9 mm petechial hemorrhage in the left anterior frontal lobe series 3, image 18, with superimposed scattered left MCA territory cytotoxic edema most pronounced overlying that area and in the posterior left frontal lobe, perirolandic area (series 3, image 25). No intracranial mass effect or midline shift. No new intracranial hemorrhage. Unchanged nondilated ventricles. Small cavum septum pellucidum. Stable gray-white differentiation elsewhere, normal. Normal basilar cisterns, negative posterior fossa. Vascular: Calcified atherosclerosis at the skull base. No suspicious intracranial vascular hyperdensity. Skull: No fracture identified. Sinuses/Orbits:  Visualized paranasal sinuses and mastoids are stable and well aerated. Other: Calcified scalp vessel atherosclerosis. No orbit or scalp soft tissue injury identified. IMPRESSION:  1. Stable evolving, scattered Left MCA territory infarcts with petechial hemorrhage - possibly subacute and extended from the ischemia demonstrated by MRI last month. No malignant hemorrhagic transformation or intracranial mass effect. 2. No new intracranial abnormality. Advanced calcified atherosclerosis. Electronically Signed   By: Odessa Fleming M.D.   On: 10/08/2023 06:11   CT HEAD WO CONTRAST ( )  Result Date: 10/07/2023 CLINICAL DATA:  Initial evaluation for acute trauma, fall. EXAM: CT HEAD WITHOUT CONTRAST TECHNIQUE: Contiguous axial images were obtained from the base of the skull through the vertex without intravenous contrast. RADIATION DOSE REDUCTION: This exam was performed according to the departmental dose-optimization program which includes automated exposure control, adjustment of the mA and/or kV according to patient size and/or use of iterative reconstruction technique. COMPARISON:  Prior study from 09/18/2023. FINDINGS: Brain: Cerebral volume within normal limits. Patchy hypodensity involving the left frontal and parietal lobes, consistent with an evolving acute ischemic left MCA distribution infarct. Superimposed small volume hemorrhage at the level of the left frontal operculum measuring 1 cm (series 2, image 18). No significant mass effect. No other acute large vessel territory infarct or intracranial hemorrhage. No mass lesion or midline shift. No hydrocephalus or extra-axial fluid collection. Vascular: No abnormal hyperdense vessel. Skull: Scalp soft tissues within normal limits.  Calvarium intact. Sinuses/Orbits: Globes and orbital soft tissues within normal limits. Paranasal sinuses and mastoid air cells are clear. Other: None. IMPRESSION: Evidence for new evolving acute left MCA territory infarct involving the left frontal and parietal lobes. Superimposed patchy small volume hemorrhage, most pronounced at the left frontal operculum. No significant mass effect. Critical Value/emergent  results were called by telephone at the time of interpretation on 10/07/2023 at 9:59 pm to provider [address] , who verbally acknowledged these results. Electronically Signed   By: Rise Mu M.D.   On: 10/07/2023 22:10    Vitals:   10/08/23 0208 10/08/23 0300 10/08/23 0418 10/08/23 0618  BP: 131/61 (!) 143/70 132/70 127/61  Pulse: 71 72 70 75  Resp: 18 18  18   Temp:      TempSrc:      SpO2: 97% 99% 97% 100%  Weight:      Height:       PHYSICAL EXAM General: Frail ill appearing African-American male not in distress Psych: Slow to respond CV: Regular rate and rhythm on monitor Respiratory: Breathing comfortably GI: Abdomen soft and nontender   NEURO:  Mental Status: Patient is awake and interactive.  Speech is hesitant slow but clear, significant aphasia with paraphasic errors  Follows simple commands.  No neglect  Cranial Nerves:  II: PERRL III, IV, VI: Patient fixates and tracks examiner throughout with left gaze preference V: Corneals intact VII: Right facial droop  VIII: Hearing is intact to voice. IX, X: Cough and gag reflexes are intact XI: Head is grossly midline XII: Does not protrude tongue Motor: Right upper extremity hand is 2-3/5 for grip, 2/5 for elbow flexion, 0/5 for elbow extension.  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 Repeat CT in 3-5 days. If repeat CT  shows no bleed, will restart plavix. Then continue DAPT for total 3 months and then ASA alone  Hx of Stroke/TIA 08/2023: Acute left occipital and posterior left frontal lobe watershed infarcts due to occluded left ICA with reconstitution at communicating segments.  EF 60 to 65%, LDL 98 and A1c 5.8. Discharged to CIR on aspirin and plavix for 3 months, then aspirin alone.  Also put on Crestor 20.  Hypertension Home meds:  carvedilol, hydralazine, terazosin Stable Decreased hydralazine from 3 times daily to twice daily dosing BP goal: Normotensive.  Due to Left ICA Occlusion, avoid hypotension.   Hyperlipidemia Home meds:  crestor 20mg  LDL 90, goal < 70 Medication was just started in late September during previous admission Continue statin at discharge  Other Stroke Risk Factors Former smoker PAD status post bilateral BKA  Other acute issues Status post renal and pancreatic transplant Recent UTI CKD 3B, creatinine 2.47--2.39  Hospital day # 9   Pt seen by Neuro NP/APP and later by MD. Note/plan to be edited by MD as needed.    Lynnae January, DNP, AGACNP-BC [facility] Please use AMION for contact information & EPIC for messaging.  ATTENDING NOTE: I reviewed above note and agree with the assessment and plan. Pt was seen and examined.   Wife at bedside.  Patient reclining in bed, having breakfast.  Patient awake alert, still has expressive aphasia but improving, frequent paraphasic errors and hesitation of speech but able to speak words but not full sentence.  Can say hospital but able to identify from multiple choices.  Told me age 23 instead of 89.  Orientated to people and month but not year.  Visual field full, no gaze palsy, right facial droop, right upper extremity proximal 0/5, finger grip 2/5.  Right lower extremity 3/5.  Decreased light touch sensation on the right.  Patient fell yesterday during transition from chair to bed, however no neurological changes  per wife.  CT repeat showed left MCA subacute infarct extension from previous imaging and small left frontal hemorrhagic transformation.  Recommend MRI and MRA repeat.  Continue aspirin 81, hold off Plavix at this time.  Recommend to repeat CT prior to discharge, if stable, okay to restart DAPT for total 3 months and then aspirin alone.  Continue statin.  Will follow  For detailed assessment and plan, please refer to above/below as I have made changes wherever appropriate.   Marvel Plan, MD PhD Stroke Neurology 10/08/2023 6:39 PM    To contact Stroke Continuity provider, please refer to [email or web address]. After hours, contact General Neurology

## 2023-10-08 NOTE — Progress Notes (Signed)
EEG complete - results pending 

## 2023-10-08 NOTE — Procedures (Signed)
Patient Name: [name]  MRN: [id number]  Epilepsy Attending: Charlsie Quest  Referring Physician/Provider: Gordy Councilman, MD  Date: 10/08/2023 Duration: 26.23 mins  Patient history: 47 yo M with scattered small left hemisphere infarcts on brain MRI last month, left MCA infarct petechial hemorrhage on head CT yesterday after fall. EEG to evaluate for seizure.  Level of alertness: Awake, asleep  AEDs during EEG study: None  Technical aspects: This EEG study was done with scalp electrodes positioned according to the 10-20 International system of electrode placement. Electrical activity was reviewed with band pass filter of 1-70Hz , sensitivity of 7 uV/mm, display speed of 89mm/sec with a 60Hz  notched filter applied as appropriate. EEG data were recorded continuously and digitally stored.  Video monitoring was available and reviewed as appropriate.  Description: The posterior dominant rhythm consists of 7.5 Hz activity of moderate voltage (25-35 uV) seen predominantly in posterior head regions, symmetric and reactive to eye opening and eye closing. Sleep was characterized by vertex waves, sleep spindles (12 to 14 Hz), maximal frontocentral region. EEG showed intermittent generalized and lateralized left hemisphere 3 to 6 Hz theta-delta slowing. Hyperventilation and photic stimulation were not performed.     ABNORMALITY - Intermittent slow, generalized and lateralized left hemisphere   IMPRESSION: This study is suggestive of cortical dysfunction arising from left hemisphere  likely secondary to underlying stroke. Additionally there is mild diffuse encephalopathy. No seizures or epileptiform discharges were seen throughout the recording.  [name]

## 2023-10-08 NOTE — Progress Notes (Signed)
Per nurse -  Pt going to MRI soon. Will try for EEG again when available.

## 2023-10-08 NOTE — Progress Notes (Signed)
PROGRESS NOTE   Subjective/Complaints:  Pt doing ok, had a fall last night and CT head revealed new CVA with tiny hemorrhage. Neuro consulted, but remained in IPR given stability; repeat CT this morning stable. Pt doing well, no complaints. Slept well, denies pain, LBM yesterday, urinating fine. Denies any other complaints or concerns today.   ROS: Limited by aphasia although able to communicate better, denies CP, SOB, abd pain, n/v/d/c.  Pertinent positives above.  Objective:   CT HEAD WO CONTRAST ( )  Result Date: 10/08/2023 CLINICAL DATA:  47 year old male with scattered small left hemisphere infarcts on brain MRI last month, left MCA infarct petechial hemorrhage on head CT yesterday after fall. Subsequent encounter. EXAM: CT HEAD WITHOUT CONTRAST TECHNIQUE: Contiguous axial images were obtained from the base of the skull through the vertex without intravenous contrast. RADIATION DOSE REDUCTION: This exam was performed according to the departmental dose-optimization program which includes automated exposure control, adjustment of the mA and/or kV according to patient size and/or use of iterative reconstruction technique. COMPARISON:  Head CT 2144 hours yesterday FINDINGS: Brain: Stable small roughly 9 mm petechial hemorrhage in the left anterior frontal lobe series 3, image 18, with superimposed scattered left MCA territory cytotoxic edema most pronounced overlying that area and in the posterior left frontal lobe, perirolandic area (series 3, image 25). No intracranial mass effect or midline shift. No new intracranial hemorrhage. Unchanged nondilated ventricles. Small cavum septum pellucidum. Stable gray-white differentiation elsewhere, normal. Normal basilar cisterns, negative posterior fossa. Vascular: Calcified atherosclerosis at the skull base. No suspicious intracranial vascular hyperdensity. Skull: No fracture identified.  Sinuses/Orbits: Visualized paranasal sinuses and mastoids are stable and well aerated. Other: Calcified scalp vessel atherosclerosis. No orbit or scalp soft tissue injury identified. IMPRESSION: 1. Stable evolving, scattered Left MCA territory infarcts with petechial hemorrhage - possibly subacute and extended from the ischemia demonstrated by MRI last month. No malignant hemorrhagic transformation or intracranial mass effect. 2. No new intracranial abnormality. Advanced calcified atherosclerosis. Electronically Signed   By: Odessa Fleming M.D.   On: 10/08/2023 06:11   CT HEAD WO CONTRAST ( )  Result Date: 10/07/2023 CLINICAL DATA:  Initial evaluation for acute trauma, fall. EXAM: CT HEAD WITHOUT CONTRAST TECHNIQUE: Contiguous axial images were obtained from the base of the skull through the vertex without intravenous contrast. RADIATION DOSE REDUCTION: This exam was performed according to the departmental dose-optimization program which includes automated exposure control, adjustment of the mA and/or kV according to patient size and/or use of iterative reconstruction technique. COMPARISON:  Prior study from 09/18/2023. FINDINGS: Brain: Cerebral volume within normal limits. Patchy hypodensity involving the left frontal and parietal lobes, consistent with an evolving acute ischemic left MCA distribution infarct. Superimposed small volume hemorrhage at the level of the left frontal operculum measuring 1 cm (series 2, image 18). No significant mass effect. No other acute large vessel territory infarct or intracranial hemorrhage. No mass lesion or midline shift. No hydrocephalus or extra-axial fluid collection. Vascular: No abnormal hyperdense vessel. Skull: Scalp soft tissues within normal limits.  Calvarium intact. Sinuses/Orbits: Globes and orbital soft tissues within normal limits. Paranasal sinuses and mastoid air cells are clear. Other: None. IMPRESSION: Evidence for new  evolving acute left MCA territory infarct  involving the left frontal and parietal lobes. Superimposed patchy small volume hemorrhage, most pronounced at the left frontal operculum. No significant mass effect. Critical Value/emergent results were called by telephone at the time of interpretation on 10/07/2023 at 9:59 pm to provider Jolonda Gomm , who verbally acknowledged these results. Electronically Signed   By: Rise Mu M.D.   On: 10/07/2023 22:10   Recent Labs    10/08/23 0912  WBC 7.7  HGB 12.5*  HCT 39.9  PLT 495*    No results for input(s): "NA", "K", "CL", "CO2", "GLUCOSE", "BUN", "CREATININE", "CALCIUM" in the last 72 hours.   Intake/Output Summary (Last 24 hours) at 10/08/2023 1022 Last data filed at 10/08/2023 1006 Gross per 24 hour  Intake 240 ml  Output 900 ml  Net -660 ml        Physical Exam: Vital Signs Blood pressure 127/61, pulse 75, temperature 98.6 F (37 C), temperature source Oral, resp. rate 18, height 5\' 5"  (1.651 m), weight 79.8 kg, SpO2 100%.   General: No acute distress, resting in bed.  Mood and affect are appropriate Heart: Regular rate and rhythm no rubs murmurs or extra sounds Lungs: Clear to auscultation, breathing unlabored, no rales or wheezes Abdomen: Positive bowel sounds, soft nontender to palpation, nondistended Extremities: No clubbing, cyanosis, or edema. B/L BKAs.  Skin: No evidence of breakdown, no evidence of rash  PRIOR EXAMS: + aphasia, fluent with neologisms , able to repeat, name simple object s, correctly selects "hospital" as current location  + Apraxic and right inattention.  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 RLE otherwise did not move today, also complicated by motor apraxia. LUE and LLE grossly antigravity and against resistance, 5 out of 5  decreased sense of pain RUE/RLE Musculoskeletal: residual limbs well shaped.  Bilateral BKA   Assessment/Plan: 1. Functional deficits which require  3+ hours per day of interdisciplinary therapy in a comprehensive inpatient rehab setting. Physiatrist is providing close team supervision and 24 hour management of active medical problems listed below. Physiatrist and rehab team continue to assess barriers to discharge/monitor patient progress toward functional and medical goals  Care Tool:  Bathing    Body parts bathed by patient: Right arm, Chest, Abdomen, Front perineal area, Buttocks, Right upper leg, Left upper leg, Face   Body parts bathed by helper: Left arm Body parts n/a: Left lower leg, Right lower leg   Bathing assist Assist Level: Maximal Assistance - Patient 24 - 49%     Upper Body Dressing/Undressing Upper body dressing   What is the patient wearing?: Pull over shirt    Upper body assist Assist Level: Maximal Assistance - Patient 25 - 49%    Lower Body Dressing/Undressing Lower body dressing      What is the patient wearing?: Pants, Underwear/pull up     Lower body assist Assist for lower body dressing: Maximal Assistance - Patient 25 - 49%     Toileting Toileting    Toileting assist Assist for toileting: Maximal Assistance - Patient 25 - 49%     Transfers Chair/bed transfer  Transfers assist  Chair/bed transfer activity did not occur: Safety/medical concerns  Chair/bed transfer assist level: Minimal Assistance - Patient > 75% (squat pivot)     Locomotion Ambulation   Ambulation assist   Ambulation activity did not occur: Safety/medical concerns          Walk 10  feet activity   Assist  Walk 10 feet activity did not occur: Safety/medical concerns        Walk 50 feet activity   Assist Walk 50 feet with 2 turns activity did not occur: Safety/medical concerns         Walk 150 feet activity   Assist Walk 150 feet activity did not occur: Safety/medical concerns         Walk 10 feet on uneven surface  activity   Assist Walk 10 feet on uneven surfaces activity did not  occur: Safety/medical concerns         Wheelchair     Assist Is the patient using a wheelchair?: Yes Type of Wheelchair: Manual    Wheelchair assist level: Moderate Assistance - Patient 50 - 74% Max wheelchair distance: 150    Wheelchair 50 feet with 2 turns activity    Assist        Assist Level: Moderate Assistance - Patient 50 - 74%   Wheelchair 150 feet activity     Assist      Assist Level: Moderate Assistance - Patient 50 - 74%   Blood pressure 127/61, pulse 75, temperature 98.6 F (37 C), temperature source Oral, resp. rate 18, height 5\' 5"  (1.651 m), weight 79.8 kg, SpO2 100%.  Medical Problem List and Plan: 1. 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 -10/08/23 fall last night, CT head revealed new stroke and tiny hemorrhage, Dr. Iver Nestle of neuro consulted, appreciate assistance; recommended holding ASA, plavix, and lovenox; repeat CT at 6hrs stable so pt will remain in IPR, EEG ordered by her, neuro following.   2.  Antithrombotics: ALL HELD 10/08/23 UNTIL NEURO DEEMS APPROPRIATE -DVT/anticoagulation:  Pharmaceutical: Lovenox 40mg  daily -antiplatelet therapy: Aspirin and Plavix for three months followed by aspirin alone (started 9/26)   3. Pain Management: Tylenol as needed   4. Mood/Behavior/Sleep: LCSW to evaluate and provide emotional support             -antipsychotic agents: n/a   -Melatonin 5mg  at bedtime  5. Neuropsych/cognition: This patient is not quite capable of making decisions on his own behalf.   -language  improving. -may have capacity soon.   6. Skin/Wound Care: Routine skin care checks -Discussed with nursing good skin check of bilateral residual limbs once in bed at rehab   7. Fluids/Electrolytes/Nutrition: strict Is and Os and follow-up chemistries   8: Hypertension: monitor TID and prn             -continue hydralazine 50 mg BID             -continue carvedilol 6.25 mg BID             -continue terazosin 4 mg q HS   -10/7: Increase Hydralazine to 50 mg TID -10/11 good  control , improving   -10/12-13/24 BPs looking good, cont regimen Vitals:   10/06/23 1308 10/06/23 1936 10/07/23 0502 10/07/23 1448  BP: (!) 121/57 137/66 132/72 (!) 149/77   10/07/23 1923 10/07/23 2112 10/07/23  2338 10/08/23 0056  BP: (!) 172/74 (!) 140/65 131/62 139/68   10/08/23 0208 10/08/23 0300 10/08/23 0418 10/08/23 0618  BP: 131/61 (!) 143/70 132/70 127/61      9: Hyperlipidemia: continue rosuvastatin 20mg  daily   10: 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 Renal signed off -myfortic stopped in setting of bacteremia per his transplant center-- will need to be restarted at previous dose at d/c              -Start IVF 10/9- recheck in am   -10/07/23 Cr 2.39 yesterday; monitor with routine labs 13: GERD: continue protonix 40mg  BID   14: DM s/p pancreas transplant: CBGs QID; A1c = 5.8% (no home meds listed)             -discontinue SSI              -started carb modified diet- pt refuses now on reg diet (same as home)    - well controlled, continue current regimen 10/10  15: Nausea: resolved -discontinue Reglan -continue PPI -Zofran prn   15: UTI: E. coli isolated: continue ceftriaxone 2 grams q 24 hours (day #4)             -completed abx--observe   16: Urinary retention: purewick in place             -10/6-continue PVRs thru today perhaps, in and out cath PRN   -pvr's all lesss than 160cc now, continent  -eob to void when possible  -terazosin now at 4 mg at bedtime               17: Hypokalemia: repleted; follow-up BMP -10-8 resolved with daily supplementation daily   18.  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   LOS: 9 days A FACE TO FACE EVALUATION WAS PERFORMED  [address] 10/08/2023, 10:22 AM

## 2023-10-08 NOTE — Plan of Care (Signed)
  Problem: Consults Goal: RH STROKE PATIENT EDUCATION Description: See Patient Education module for education specifics  Outcome: Progressing   Problem: RH BOWEL ELIMINATION Goal: RH STG MANAGE BOWEL WITH ASSISTANCE Description: STG Manage Bowel with toileting Assistance. Outcome: Progressing Goal: RH STG MANAGE BOWEL W/MEDICATION W/ASSISTANCE Description: STG Manage Bowel with Medication with mod I Assistance. Outcome: Progressing   Problem: RH BLADDER ELIMINATION Goal: RH STG MANAGE BLADDER WITH ASSISTANCE Description: STG Manage Bladder With toileting Assistance Outcome: Progressing Goal: RH STG MANAGE BLADDER WITH MEDICATION WITH ASSISTANCE Description: STG Manage Bladder With Medication With mod I Assistance. Outcome: Progressing   Problem: RH SAFETY Goal: RH STG ADHERE TO SAFETY PRECAUTIONS W/ASSISTANCE/DEVICE Description: STG Adhere to Safety Precautions With cues Assistance/Device. Outcome: Progressing   Problem: RH KNOWLEDGE DEFICIT Goal: RH STG INCREASE KNOWLEDGE OF DIABETES Description:  Patient and wife will be able to manage DM with medications and dietary modification using educational resources independently Outcome: Progressing Goal: RH STG INCREASE KNOWLEDGE OF HYPERTENSION Description: Patient and wife will be able to manage HTN with medications and dietary modification using educational resources independently Outcome: Progressing Goal: RH STG INCREASE KNOWLEGDE OF HYPERLIPIDEMIA Description: Patient and wife will be able to manage HLD with medications and dietary modification using educational resources independently Outcome: Progressing Goal: RH STG INCREASE KNOWLEDGE OF STROKE PROPHYLAXIS Description: Patient and wife will be able to manage Secondary stroke risks with medications and dietary modification using educational resources independently Outcome: Progressing

## 2023-10-09 DIAGNOSIS — I10 Essential (primary) hypertension: Secondary | ICD-10-CM | POA: Diagnosis not present

## 2023-10-09 DIAGNOSIS — Z89512 Acquired absence of left leg below knee: Secondary | ICD-10-CM | POA: Diagnosis not present

## 2023-10-09 DIAGNOSIS — N1832 Chronic kidney disease, stage 3b: Secondary | ICD-10-CM | POA: Diagnosis not present

## 2023-10-09 DIAGNOSIS — I639 Cerebral infarction, unspecified: Secondary | ICD-10-CM | POA: Diagnosis not present

## 2023-10-09 LAB — BASIC METABOLIC PANEL
Anion gap: 7 (ref 5–15)
BUN: 34 mg/dL — ABNORMAL HIGH (ref 6–20)
CO2: 22 mmol/L (ref 22–32)
Calcium: 8.9 mg/dL (ref 8.9–10.3)
Chloride: 110 mmol/L (ref 98–111)
Creatinine, Ser: 3.03 mg/dL — ABNORMAL HIGH (ref 0.61–1.24)
GFR, Estimated: 25 mL/min — ABNORMAL LOW (ref >60)
Glucose, Bld: 121 mg/dL — ABNORMAL HIGH (ref 70–99)
Potassium: 4.2 mmol/L (ref 3.5–5.1)
Sodium: 139 mmol/L (ref 135–145)

## 2023-10-09 LAB — CBC
HCT: 40.4 % (ref 39.0–52.0)
Hemoglobin: 12.6 g/dL — ABNORMAL LOW (ref 13.0–17.0)
MCH: 30.3 pg (ref 26.0–34.0)
MCHC: 31.2 g/dL (ref 30.0–36.0)
MCV: 97.1 fL (ref 80.0–100.0)
Platelets: 461 10*3/uL — ABNORMAL HIGH (ref 150–400)
RBC: 4.16 MIL/uL — ABNORMAL LOW (ref 4.22–5.81)
RDW: 16.8 % — ABNORMAL HIGH (ref 11.5–15.5)
WBC: 7.3 10*3/uL (ref 4.0–10.5)
nRBC: 0 % (ref 0.0–0.2)

## 2023-10-09 MED ORDER — HEPARIN SODIUM (PORCINE) 5000 UNIT/ML IJ SOLN
5000.0000 [IU] | Freq: Three times a day (TID) | INTRAMUSCULAR | Status: DC
  Administered 2023-10-09 – 2023-10-16 (×19): 5000 [IU] via SUBCUTANEOUS
  Filled 2023-10-09 (×21): qty 1

## 2023-10-09 NOTE — Progress Notes (Signed)
Physical Therapy Weekly Progress Note  Patient Details  Name: [name] MRN: [id number] Date of Birth: 02/14/1976  Beginning of progress report period: September 30, 2023 End of progress report period: October 09, 2023  Today's Date: 10/09/2023  Patient has met {number 1-5:22450} of {number 1-5:20334} short term goals.  ***  Patient continues to demonstrate the following deficits {impairments:3041632} and therefore will continue to benefit from skilled PT intervention to increase functional independence with mobility.  Patient {LTG progression:3041653}.  {plan of NGEX:5284132}  PT Short Term Goals {GMW:1027253}  Skilled Therapeutic Interventions/Progress Updates:      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), R hemi, avoid hypotension Restrictions Weight Bearing Restrictions: No   Pain: Pain Assessment Pain Scale: 0-10 Pain Score: 0-No pain Vision/Perception     Mobility:   Locomotion :    Trunk/Postural Assessment :    Balance:     Therapy/Group: Individual Therapy  Loel Dubonnet PT, DPT, CSRS 10/09/2023, 5:14 PM

## 2023-10-09 NOTE — Progress Notes (Addendum)
STROKE TEAM PROGRESS NOTE   BRIEF HPI [name] is a 47 y.o. 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 Due to observed facial droop, a code stroke was activated.  MRI brain with scattered small foci of acute subcortical infarction in the left occipital lobe and posterior left frontal lobe. Faint diffusion signal abnormality along the left parietal lobe may reflect evolving infarct versus seizure related cytotoxic edema. 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 Discharged to rehab 10/5.  SIGNIFICANT HOSPITAL EVENTS 10/12: unwitnessed fall. CTH shows evolving acute left frontal/parietal infarct, small volume hemorrhage.  10/13: Repeat CTH: stable, hemorrhage appears subacute 10/14: MRI showed subacute infarct with petechial hemorrhage and MRA showed left ICA now recannalized.   INTERIM HISTORY/SUBJECTIVE No family at the bedside. Pt was sleeping in bed, but arousable and neuro stable unchanged. Told me that his wife is at work. I then called his wife and gave her update.   OBJECTIVE  CBC    Component Value Date/Time   WBC 7.3 10/09/2023 0530   RBC 4.16 (L) 10/09/2023 0530   HGB 12.6 (L) 10/09/2023 0530   HCT 40.4 10/09/2023 0530   PLT 461 (H) 10/09/2023 0530   MCV 97.1 10/09/2023 0530   MCH 30.3 10/09/2023 0530   MCHC 31.2 10/09/2023 0530   RDW 16.8 (H) 10/09/2023 0530   LYMPHSABS 1.2 09/30/2023 1010   MONOABS 1.0 09/30/2023 1010   EOSABS 0.2 09/30/2023 1010   BASOSABS 0.1 09/30/2023 1010    BMET    Component Value Date/Time   NA 139 10/09/2023 0530   NA 139 09/26/2021 0000   K 4.2 10/09/2023 0530   CL 110 10/09/2023 0530   CO2 22 10/09/2023 0530    GLUCOSE 121 (H) 10/09/2023 0530   BUN 34 (H) 10/09/2023 0530   BUN 21 09/26/2021 0000   CREATININE 3.03 (H) 10/09/2023 0530   CREATININE 1.20 05/30/2014 1415   CALCIUM 8.9 10/09/2023 0530   CALCIUM 8.4 09/08/2011 0746   EGFR 42.0 09/12/2023 0000   EGFR 42 09/12/2023 0000   GFRNONAA 25 (L) 10/09/2023 0530    IMAGING past 24 hours MR BRAIN WO CONTRAST  Result Date: 10/08/2023 CLINICAL DATA:  Stroke follow-up EXAM: MRI HEAD WITHOUT CONTRAST MRA HEAD WITHOUT CONTRAST TECHNIQUE: Multiplanar, multi-echo pulse sequences of the brain and surrounding structures were acquired without intravenous contrast. Angiographic images of the Circle of Willis were acquired using MRA technique without intravenous contrast. COMPARISON:  09/18/2023 FINDINGS: MRI HEAD FINDINGS Brain: There is no abnormal diffusion restriction to indicate acute infarct. However, there are multiple new areas of edema within the left frontal and parietal cortices and within the deep white matter of the left hemisphere. There is cortical petechial hemorrhage at the sites of new edema. No mass effect or hydrocephalus. There is multifocal hyperintense T2-weighted signal within the white matter. Generalized volume loss. The midline structures are normal. Skull and upper cervical spine: Normal calvarium and skull base. Visualized upper cervical spine and soft tissues are normal. Sinuses/Orbits:No paranasal sinus fluid levels or advanced mucosal thickening. No mastoid or middle ear effusion. Normal orbits. MRA HEAD FINDINGS POSTERIOR CIRCULATION: --Vertebral arteries: Normal --Inferior cerebellar arteries: Normal. --Basilar artery: Normal. --Superior cerebellar arteries: Normal. --Posterior cerebral arteries: Normal. ANTERIOR CIRCULATION: --Intracranial internal  carotid arteries: Normal. --Anterior cerebral arteries (ACA): Normal. --Middle cerebral arteries (MCA): Normal. IMPRESSION: 1. Multiple new areas of edema within the left frontal and  parietal cortices and deep white matter of the left hemisphere. As these sites are all in the left MCA territory, they are favored to be due to subacute ischemia. 2. Cortical petechial hemorrhage at the sites of new edema. 3. Normal intracranial MRA. Electronically Signed   By: Deatra Robinson M.D.   On: 10/08/2023 20:21   MR ANGIO HEAD WO CONTRAST  Result Date: 10/08/2023 CLINICAL DATA:  Stroke follow-up EXAM: MRI HEAD WITHOUT CONTRAST MRA HEAD WITHOUT CONTRAST TECHNIQUE: Multiplanar, multi-echo pulse sequences of the brain and surrounding structures were acquired without intravenous contrast. Angiographic images of the Circle of Willis were acquired using MRA technique without intravenous contrast. COMPARISON:  09/18/2023 FINDINGS: MRI HEAD FINDINGS Brain: There is no abnormal diffusion restriction to indicate acute infarct. However, there are multiple new areas of edema within the left frontal and parietal cortices and within the deep white matter of the left hemisphere. There is cortical petechial hemorrhage at the sites of new edema. No mass effect or hydrocephalus. There is multifocal hyperintense T2-weighted signal within the white matter. Generalized volume loss. The midline structures are normal. Skull and upper cervical spine: Normal calvarium and skull base. Visualized upper cervical spine and soft tissues are normal. Sinuses/Orbits:No paranasal sinus fluid levels or advanced mucosal thickening. No mastoid or middle ear effusion. Normal orbits. MRA HEAD FINDINGS POSTERIOR CIRCULATION: --Vertebral arteries: Normal --Inferior cerebellar arteries: Normal. --Basilar artery: Normal. --Superior cerebellar arteries: Normal. --Posterior cerebral arteries: Normal. ANTERIOR CIRCULATION: --Intracranial internal carotid arteries: Normal. --Anterior cerebral arteries (ACA): Normal. --Middle cerebral arteries (MCA): Normal. IMPRESSION: 1. Multiple new areas of edema within the left frontal and parietal cortices and  deep white matter of the left hemisphere. As these sites are all in the left MCA territory, they are favored to be due to subacute ischemia. 2. Cortical petechial hemorrhage at the sites of new edema. 3. Normal intracranial MRA. Electronically Signed   By: Deatra Robinson M.D.   On: 10/08/2023 20:21    Vitals:   10/08/23 1738 10/09/23 0446 10/09/23 0855 10/09/23 1320  BP: (!) 164/77 (!) 143/70 (!) 140/71 (!) 145/68  Pulse: 72 76  79  Resp:  17  17  Temp:  98.6 F (37 C)  98.5 F (36.9 C)  TempSrc:      SpO2: 100% 99%  100%  Weight:      Height:       PHYSICAL EXAM General: Frail ill appearing African-American male not in distress Psych: Slow to respond CV: Regular rate and rhythm on monitor Respiratory: Breathing comfortably GI: Abdomen soft and nontender   NEURO:  Mental Status: Patient is awake and interactive.  Speech is hesitant slow but clear, significant aphasia with paraphasic errors  Follows simple commands.  No neglect  Cranial Nerves:  II: PERRL III, IV, VI: Patient fixates and tracks examiner throughout with left gaze preference V: Corneals intact VII: Right facial droop  VIII: Hearing is intact to voice. IX, X: Cough and gag reflexes are intact XI: Head is grossly midline XII: Does not protrude tongue Motor: Right upper extremity hand is 2-3/5 for grip, 2/5 for elbow flexion, 0/5 for elbow extension.  Left upper extremity no pronator drift, 5/5 Sensation: Reduced sensation on the right face arm and leg Coordination: Intact in the left upper extremity Gait: Deferred given bilateral leg amputations  MR BRAIN WO CONTRAST  Result Date: 10/08/2023 CLINICAL DATA:  Stroke follow-up EXAM: MRI HEAD WITHOUT CONTRAST MRA HEAD WITHOUT CONTRAST TECHNIQUE: Multiplanar, multi-echo pulse sequences of the brain and surrounding structures were acquired without intravenous contrast. Angiographic images of the Circle of Willis were acquired using MRA technique without intravenous  contrast. COMPARISON:  09/18/2023 FINDINGS: MRI HEAD FINDINGS Brain: There is no abnormal diffusion restriction to indicate acute infarct. However, there are multiple new areas of edema within the left frontal and parietal cortices and within the deep white matter of the left hemisphere. There is cortical petechial hemorrhage at the sites of new edema. No mass effect or hydrocephalus. There is multifocal hyperintense T2-weighted signal within the white matter. Generalized volume loss. The midline structures are normal. Skull and upper cervical spine: Normal calvarium and skull base. Visualized upper cervical spine and soft tissues are normal. Sinuses/Orbits:No paranasal sinus fluid levels or advanced mucosal thickening. No mastoid or middle ear effusion. Normal orbits. MRA HEAD FINDINGS POSTERIOR CIRCULATION: --Vertebral arteries: Normal --Inferior cerebellar arteries: Normal. --Basilar artery: Normal. --Superior cerebellar arteries: Normal. --Posterior cerebral arteries: Normal. ANTERIOR CIRCULATION: --Intracranial internal carotid arteries: Normal. --Anterior cerebral arteries (ACA): Normal. --Middle cerebral arteries (MCA): Normal. IMPRESSION: 1. Multiple new areas of edema within the left frontal and parietal cortices and deep white matter of the left hemisphere. As these sites are all in the left MCA territory, they are favored to be due to subacute ischemia. 2. Cortical petechial hemorrhage at the sites of new edema. 3. Normal intracranial MRA. Electronically Signed   By: Deatra Robinson M.D.   On: 10/08/2023 20:21   MR ANGIO HEAD WO CONTRAST  Result Date: 10/08/2023 CLINICAL DATA:  Stroke follow-up EXAM: MRI HEAD WITHOUT CONTRAST MRA HEAD WITHOUT CONTRAST TECHNIQUE: Multiplanar, multi-echo pulse sequences of the brain and surrounding structures were acquired without intravenous contrast. Angiographic images of the Circle of Willis were acquired using MRA technique without intravenous contrast. COMPARISON:   09/18/2023 FINDINGS: MRI HEAD FINDINGS Brain: There is no abnormal diffusion restriction to indicate acute infarct. However, there are multiple new areas of edema within the left frontal and parietal cortices and within the deep white matter of the left hemisphere. There is cortical petechial hemorrhage at the sites of new edema. No mass effect or hydrocephalus. There is multifocal hyperintense T2-weighted signal within the white matter. Generalized volume loss. The midline structures are normal. Skull and upper cervical spine: Normal calvarium and skull base. Visualized upper cervical spine and soft tissues are normal. Sinuses/Orbits:No paranasal sinus fluid levels or advanced mucosal thickening. No mastoid or middle ear effusion. Normal orbits. MRA HEAD FINDINGS POSTERIOR CIRCULATION: --Vertebral arteries: Normal --Inferior cerebellar arteries: Normal. --Basilar artery: Normal. --Superior cerebellar arteries: Normal. --Posterior cerebral arteries: Normal. ANTERIOR CIRCULATION: --Intracranial internal carotid arteries: Normal. --Anterior cerebral arteries (ACA): Normal. --Middle cerebral arteries (MCA): Normal. IMPRESSION: 1. Multiple new areas of edema within the left frontal and parietal cortices and deep white matter of the left hemisphere. As these sites are all in the left MCA territory, they are favored to be due to subacute ischemia. 2. Cortical petechial hemorrhage at the sites of new edema. 3. Normal intracranial MRA. Electronically Signed   By: Deatra Robinson M.D.   On: 10/08/2023 20:21     ASSESSMENT/PLAN  Stroke: now subacute left MCA infarcts with small frontal hemorrhagic transformation, etiology likely due to large vessel disease with left ICA occlusion although recannalized now CT head Acute left frontal and parietal lobes, superimposed patchy small volume hemorrhage Repeat CTH, stable evolving infarcts, appears  subacute MRI new areas of left MCA infarct compared with last MRI but now  subacute, no new acute infarct MRA left ICA recannalized from extracranial portion but still has high grade stenosis at left ICA siphon Will check orthostatic vitals VTE prophylaxis - on heparin subq now on aspirin 81 mg daily. Repeat CT in 3-5 days. If repeat CT shows no bleed, will restart plavix. Then continue DAPT for total 3 months and then ASA alone  Hx of Stroke/TIA 08/2023: Acute left occipital and posterior left frontal lobe watershed infarcts due to occluded left ICA with reconstitution at communicating segments.  EF 60 to 65%, LDL 98 and A1c 5.8. Discharged to CIR on aspirin and plavix for 3 months, then aspirin alone.  Also put on Crestor 20.  Hypertension Home meds:  carvedilol, hydralazine, terazosin Stable Decreased hydralazine from 3 times daily to twice daily dosing BP goal: Normotensive.  Due to Left ICA Occlusion, avoid hypotension.   Hyperlipidemia Home meds:  crestor 20mg  LDL 90, goal < 70 Medication was just started in late September during previous admission Continue statin at discharge  Other Stroke Risk Factors Former smoker PAD status post bilateral BKA  Other acute issues Status post renal and pancreatic transplant Recent UTI CKD 3B, creatinine 2.47--2.39--3.03, primary team managing -- change lovenox to heparin subcutaneous for DVT prophylaxis  Hospital day # 10  Discussed with Dr. Rosine Abe. Neurology will sign off. Please call with questions. Pt will follow up with stroke clinic NP at [facility] in about 4 weeks. Thanks for the consult.   Marvel Plan, MD PhD Stroke Neurology 10/09/2023 3:34 PM    To contact Stroke Continuity provider, please refer to [email or web address]. After hours, contact General Neurology

## 2023-10-09 NOTE — Progress Notes (Signed)
Occupational Therapy Session Note  Patient Details  Name: [name] MRN: [id number] Date of Birth: 26-Aug-1976  Today's Date: 10/09/2023 OT Individual Time: 5284-1324 OT Individual Time Calculation (min): 72 min    Short Term Goals: Week 2:  OT Short Term Goal 1 (Week 2): Pt will don a t shirt with min A demonstrating improved motor planning and R side awareness. OT Short Term Goal 2 (Week 2): Pt will complete squat pivot transfers to toilet and or BSC with min A. OT Short Term Goal 3 (Week 2): Pt will be able to use R hand to grasp wash cloth while helper assists moving arm to wash his L arm  Skilled Therapeutic Interventions/Progress Updates:  Pt greeted supine in bed, pt agreeable to OT intervention.      Transfers/bed mobility: pt completed squat pivot transfers throughout session with MIN- MODA. MAX verbal/visual cues needed for set-up and overall technique as pt having difficulty carrying over head/hips relationship.  Worked on sit>stands from mat table with RW with saddle splint on R side as pt has improved gross grasp. Pt completed multiple sit>stands with overall MODA with max cues for anterior weight shift and safety as pt continues to be impulsive with all mobility.    ADLs:  Grooming: applied lotion to BUEs with an emphasis on directing level of care and sensory reeducation strategies.  UB dressing:donned [place] shirt with MODA, pt more receptive to hemi strategies today LB dressing: donned pants from bed level with MAX A with pt rolling R<>L as therapist pulled pants to waist line.  Footwear: donned bilateral orthotics from bed level with MAX A   NMR: worked on [employer] with pts RUE positioned on slanted table and pt instrcuted to push table forward/backwards to promote improved scapular protraction/retraction and provide NMR to RUE. Pt needed assist to keep RUE stabilized on table but noted to have active movement in R shoulder.   Pt completed sit>stands from mat  table with Rw and R saddle splint to promote increased weightbearing in RUE/RLE, graded task up and instructed pt to reach dynamically with LUE to increase proprioceptive input through RLE/RUE. Pt continues to be apraxic needing MAX cues for body mechanics and sequencing.   Ended session with pt seated in w/c with all needs within reach and safety belt alarm activated.                   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), R hemi, avoid hypotension Restrictions Weight Bearing Restrictions: No  Pain: no pain reported     Therapy/Group: Individual Therapy  [name] 10/09/2023, 12:16 PM

## 2023-10-09 NOTE — Progress Notes (Addendum)
PROGRESS NOTE   Subjective/Complaints:  Reviewed MRI and EEG report, appreciate neuro assist Pt feels good this am   ROS: Limited by aphasia although able to communicate better, denies CP, SOB, abd pain, n/v/d/c.  Pertinent positives above.  Objective:   MR BRAIN WO CONTRAST  Result Date: 10/08/2023 CLINICAL DATA:  Stroke follow-up EXAM: MRI HEAD WITHOUT CONTRAST MRA HEAD WITHOUT CONTRAST TECHNIQUE: Multiplanar, multi-echo pulse sequences of the brain and surrounding structures were acquired without intravenous contrast. Angiographic images of the Circle of Willis were acquired using MRA technique without intravenous contrast. COMPARISON:  09/18/2023 FINDINGS: MRI HEAD FINDINGS Brain: There is no abnormal diffusion restriction to indicate acute infarct. However, there are multiple new areas of edema within the left frontal and parietal cortices and within the deep white matter of the left hemisphere. There is cortical petechial hemorrhage at the sites of new edema. No mass effect or hydrocephalus. There is multifocal hyperintense T2-weighted signal within the white matter. Generalized volume loss. The midline structures are normal. Skull and upper cervical spine: Normal calvarium and skull base. Visualized upper cervical spine and soft tissues are normal. Sinuses/Orbits:No paranasal sinus fluid levels or advanced mucosal thickening. No mastoid or middle ear effusion. Normal orbits. MRA HEAD FINDINGS POSTERIOR CIRCULATION: --Vertebral arteries: Normal --Inferior cerebellar arteries: Normal. --Basilar artery: Normal. --Superior cerebellar arteries: Normal. --Posterior cerebral arteries: Normal. ANTERIOR CIRCULATION: --Intracranial internal carotid arteries: Normal. --Anterior cerebral arteries (ACA): Normal. --Middle cerebral arteries (MCA): Normal. IMPRESSION: 1. Multiple new areas of edema within the left frontal and parietal cortices and deep  white matter of the left hemisphere. As these sites are all in the left MCA territory, they are favored to be due to subacute ischemia. 2. Cortical petechial hemorrhage at the sites of new edema. 3. Normal intracranial MRA. Electronically Signed   By: Deatra Robinson M.D.   On: 10/08/2023 20:21   MR ANGIO HEAD WO CONTRAST  Result Date: 10/08/2023 CLINICAL DATA:  Stroke follow-up EXAM: MRI HEAD WITHOUT CONTRAST MRA HEAD WITHOUT CONTRAST TECHNIQUE: Multiplanar, multi-echo pulse sequences of the brain and surrounding structures were acquired without intravenous contrast. Angiographic images of the Circle of Willis were acquired using MRA technique without intravenous contrast. COMPARISON:  09/18/2023 FINDINGS: MRI HEAD FINDINGS Brain: There is no abnormal diffusion restriction to indicate acute infarct. However, there are multiple new areas of edema within the left frontal and parietal cortices and within the deep white matter of the left hemisphere. There is cortical petechial hemorrhage at the sites of new edema. No mass effect or hydrocephalus. There is multifocal hyperintense T2-weighted signal within the white matter. Generalized volume loss. The midline structures are normal. Skull and upper cervical spine: Normal calvarium and skull base. Visualized upper cervical spine and soft tissues are normal. Sinuses/Orbits:No paranasal sinus fluid levels or advanced mucosal thickening. No mastoid or middle ear effusion. Normal orbits. MRA HEAD FINDINGS POSTERIOR CIRCULATION: --Vertebral arteries: Normal --Inferior cerebellar arteries: Normal. --Basilar artery: Normal. --Superior cerebellar arteries: Normal. --Posterior cerebral arteries: Normal. ANTERIOR CIRCULATION: --Intracranial internal carotid arteries: Normal. --Anterior cerebral arteries (ACA): Normal. --Middle cerebral arteries (MCA): Normal. IMPRESSION: 1. Multiple new areas of edema within the left frontal and parietal cortices and deep  white matter of the  left hemisphere. As these sites are all in the left MCA territory, they are favored to be due to subacute ischemia. 2. Cortical petechial hemorrhage at the sites of new edema. 3. Normal intracranial MRA. Electronically Signed   By: Deatra Robinson M.D.   On: 10/08/2023 20:21   EEG adult  Result Date: 10/08/2023 Charlsie Quest, MD     10/08/2023  5:53 PM Patient Name: [name] MRN: [id number] Epilepsy Attending: Charlsie Quest Referring Physician/Provider: Gordy Councilman, MD Date: 10/08/2023 Duration: 26.23 mins Patient history: 47 yo M with scattered small left hemisphere infarcts on brain MRI last month, left MCA infarct petechial hemorrhage on head CT yesterday after fall. EEG to evaluate for seizure. Level of alertness: Awake, asleep AEDs during EEG study: None Technical aspects: This EEG study was done with scalp electrodes positioned according to the 10-20 International system of electrode placement. Electrical activity was reviewed with band pass filter of 1-70Hz , sensitivity of 7 uV/mm, display speed of 69mm/sec with a 60Hz  notched filter applied as appropriate. EEG data were recorded continuously and digitally stored.  Video monitoring was available and reviewed as appropriate. Description: The posterior dominant rhythm consists of 7.5 Hz activity of moderate voltage (25-35 uV) seen predominantly in posterior head regions, symmetric and reactive to eye opening and eye closing. Sleep was characterized by vertex waves, sleep spindles (12 to 14 Hz), maximal frontocentral region. EEG showed intermittent generalized and lateralized left hemisphere 3 to 6 Hz theta-delta slowing. Hyperventilation and photic stimulation were not performed.   ABNORMALITY - Intermittent slow, generalized and lateralized left hemisphere IMPRESSION: This study is suggestive of cortical dysfunction arising from left hemisphere likely secondary to underlying stroke. Additionally there is mild diffuse encephalopathy. No  seizures or epileptiform discharges were seen throughout the recording. [name]   CT HEAD WO CONTRAST ( )  Result Date: 10/08/2023 CLINICAL DATA:  47 year old male with scattered small left hemisphere infarcts on brain MRI last month, left MCA infarct petechial hemorrhage on head CT yesterday after fall. Subsequent encounter. EXAM: CT HEAD WITHOUT CONTRAST TECHNIQUE: Contiguous axial images were obtained from the base of the skull through the vertex without intravenous contrast. RADIATION DOSE REDUCTION: This exam was performed according to the departmental dose-optimization program which includes automated exposure control, adjustment of the mA and/or kV according to patient size and/or use of iterative reconstruction technique. COMPARISON:  Head CT 2144 hours yesterday FINDINGS: Brain: Stable small roughly 9 mm petechial hemorrhage in the left anterior frontal lobe series 3, image 18, with superimposed scattered left MCA territory cytotoxic edema most pronounced overlying that area and in the posterior left frontal lobe, perirolandic area (series 3, image 25). No intracranial mass effect or midline shift. No new intracranial hemorrhage. Unchanged nondilated ventricles. Small cavum septum pellucidum. Stable gray-white differentiation elsewhere, normal. Normal basilar cisterns, negative posterior fossa. Vascular: Calcified atherosclerosis at the skull base. No suspicious intracranial vascular hyperdensity. Skull: No fracture identified. Sinuses/Orbits: Visualized paranasal sinuses and mastoids are stable and well aerated. Other: Calcified scalp vessel atherosclerosis. No orbit or scalp soft tissue injury identified. IMPRESSION: 1. Stable evolving, scattered Left MCA territory infarcts with petechial hemorrhage - possibly subacute and extended from the ischemia demonstrated by MRI last month. No malignant hemorrhagic transformation or intracranial mass effect. 2. No new intracranial abnormality.  Advanced calcified atherosclerosis. Electronically Signed   By: Odessa Fleming M.D.   On: 10/08/2023 06:11   CT HEAD WO CONTRAST ( )  Result Date:  10/07/2023 CLINICAL DATA:  Initial evaluation for acute trauma, fall. EXAM: CT HEAD WITHOUT CONTRAST TECHNIQUE: Contiguous axial images were obtained from the base of the skull through the vertex without intravenous contrast. RADIATION DOSE REDUCTION: This exam was performed according to the departmental dose-optimization program which includes automated exposure control, adjustment of the mA and/or kV according to patient size and/or use of iterative reconstruction technique. COMPARISON:  Prior study from 09/18/2023. FINDINGS: Brain: Cerebral volume within normal limits. Patchy hypodensity involving the left frontal and parietal lobes, consistent with an evolving acute ischemic left MCA distribution infarct. Superimposed small volume hemorrhage at the level of the left frontal operculum measuring 1 cm (series 2, image 18). No significant mass effect. No other acute large vessel territory infarct or intracranial hemorrhage. No mass lesion or midline shift. No hydrocephalus or extra-axial fluid collection. Vascular: No abnormal hyperdense vessel. Skull: Scalp soft tissues within normal limits.  Calvarium intact. Sinuses/Orbits: Globes and orbital soft tissues within normal limits. Paranasal sinuses and mastoid air cells are clear. Other: None. IMPRESSION: Evidence for new evolving acute left MCA territory infarct involving the left frontal and parietal lobes. Superimposed patchy small volume hemorrhage, most pronounced at the left frontal operculum. No significant mass effect. Critical Value/emergent results were called by telephone at the time of interpretation on 10/07/2023 at 9:59 pm to provider [address] , who verbally acknowledged these results. Electronically Signed   By: Rise Mu M.D.   On: 10/07/2023 22:10   Recent Labs    10/08/23 0912  10/09/23 0530  WBC 7.7 7.3  HGB 12.5* 12.6*  HCT 39.9 40.4  PLT 495* 461*    Recent Labs    10/09/23 0530  NA 139  K 4.2  CL 110  CO2 22  GLUCOSE 121*  BUN 34*  CREATININE 3.03*  CALCIUM 8.9     Intake/Output Summary (Last 24 hours) at 10/09/2023 1610 Last data filed at 10/08/2023 2022 Gross per 24 hour  Intake 476 ml  Output 900 ml  Net -424 ml        Physical Exam: Vital Signs Blood pressure (!) 143/70, pulse 76, temperature 98.6 F (37 C), resp. rate 17, height 5\' 5"  (1.651 m), weight 79.8 kg, SpO2 99%.   General: No acute distress, resting in bed.  Mood and affect are appropriate Heart: Regular rate and rhythm no rubs murmurs or extra sounds Lungs: Clear to auscultation, breathing unlabored, no rales or wheezes Abdomen: Positive bowel sounds, soft nontender to palpation, nondistended Extremities: No clubbing, cyanosis, or edema. B/L BKAs.  Skin: No evidence of breakdown, no evidence of rash  PRIOR EXAMS: + aphasia, fluent with neologisms , able to repeat, names 1/3 simple objects and with first letter cues could name 3/3, correctly + Apraxic and right inattention.  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 RLE otherwise did not move today, also complicated by motor apraxia. LUE and LLE grossly antigravity and against resistance, 5 out of 5   Musculoskeletal: residual limbs well shaped.  Bilateral BKA   Assessment/Plan: 1. Functional deficits which require 3+ hours per day of interdisciplinary therapy in a comprehensive inpatient rehab setting. Physiatrist is providing close team supervision and 24 hour management of active medical problems listed below. Physiatrist and rehab team continue to assess barriers to discharge/monitor patient progress toward functional and medical goals  Care Tool:  Bathing    Body parts bathed by patient: Right arm, Chest, Abdomen, Front perineal  area, Buttocks, Right  upper leg, Left upper leg, Face   Body parts bathed by helper: Left arm Body parts n/a: Left lower leg, Right lower leg   Bathing assist Assist Level: Maximal Assistance - Patient 24 - 49%     Upper Body Dressing/Undressing Upper body dressing   What is the patient wearing?: Pull over shirt    Upper body assist Assist Level: Maximal Assistance - Patient 25 - 49%    Lower Body Dressing/Undressing Lower body dressing      What is the patient wearing?: Pants, Underwear/pull up     Lower body assist Assist for lower body dressing: Maximal Assistance - Patient 25 - 49%     Toileting Toileting    Toileting assist Assist for toileting: Maximal Assistance - Patient 25 - 49%     Transfers Chair/bed transfer  Transfers assist  Chair/bed transfer activity did not occur: Safety/medical concerns  Chair/bed transfer assist level: Minimal Assistance - Patient > 75% (squat pivot)     Locomotion Ambulation   Ambulation assist   Ambulation activity did not occur: Safety/medical concerns          Walk 10 feet activity   Assist  Walk 10 feet activity did not occur: Safety/medical concerns        Walk 50 feet activity   Assist Walk 50 feet with 2 turns activity did not occur: Safety/medical concerns         Walk 150 feet activity   Assist Walk 150 feet activity did not occur: Safety/medical concerns         Walk 10 feet on uneven surface  activity   Assist Walk 10 feet on uneven surfaces activity did not occur: Safety/medical concerns         Wheelchair     Assist Is the patient using a wheelchair?: Yes Type of Wheelchair: Manual    Wheelchair assist level: Moderate Assistance - Patient 50 - 74% Max wheelchair distance: 150    Wheelchair 50 feet with 2 turns activity    Assist        Assist Level: Moderate Assistance - Patient 50 - 74%   Wheelchair 150 feet activity     Assist      Assist Level: Moderate Assistance -  Patient 50 - 74%   Blood pressure (!) 143/70, pulse 76, temperature 98.6 F (37 C), resp. rate 17, height 5\' 5"  (1.651 m), weight 79.8 kg, SpO2 99%.  Medical Problem List and Plan: 1. 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  Antithrombotics: ASA 81mg  resumed per neuro recs , recheck CT head on 10/16 to see if plavix can be resumed.  Sq heparin for DVT prophyllaxis started today  -DVT/anticoagulation:  see above  -antiplatelet therapy: Aspirin and Plavix for three months followed by aspirin alone (started 9/26)- See above    3. Pain Management: Tylenol as needed   4. Mood/Behavior/Sleep: LCSW to evaluate and provide emotional support             -  antipsychotic agents: n/a   -Melatonin 5mg  at bedtime  5. Neuropsych/cognition: This patient is not quite capable of making decisions on his own behalf.   -language improving. -may have capacity soon.   6. Skin/Wound Care: Routine skin care checks -Discussed with nursing good skin check of bilateral residual limbs once in bed at rehab   7. Fluids/Electrolytes/Nutrition: strict Is and Os and follow-up chemistries   8: Hypertension: monitor TID and prn             -continue hydralazine 50 mg BID             -continue carvedilol 6.25 mg BID             -continue terazosin 4 mg q HS   -10/7: Increase Hydralazine to 50 mg TID -10/11 good  control , improving   -10/12-13/24 BPs looking good, cont regimen Vitals:   10/07/23 1923 10/07/23 2112  10/07/23 2338 10/08/23 0056  BP: (!) 172/74 (!) 140/65 131/62 139/68   10/08/23 0208 10/08/23 0300 10/08/23 0418 10/08/23 0618  BP: 131/61 (!) 143/70 132/70 127/61   10/08/23 1545 10/08/23 1737 10/08/23 1738 10/09/23 0446  BP: (!) 166/75 (!) 164/77 (!) 164/77 (!) 143/70   Was up yesterday pm loow ok today    9: Hyperlipidemia: continue rosuvastatin 20mg  daily   10: 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 Renal signed off -myfortic stopped in setting of bacteremia per his transplant center-- will need to be restarted at previous dose at d/c              -Start IVF 10/9- recheck in am   -10/07/23 Cr 2.39 yesterday; monitor with routine labs 13: GERD: continue protonix 40mg  BID   14: DM s/p pancreas transplant: CBGs QID; A1c = 5.8% (no home meds listed)             -discontinue SSI             -started carb modified diet- pt refuses now on reg diet (same as home)    - well controlled, continue current regimen 10/10  15: Nausea: resolved -discontinue Reglan -continue PPI -Zofran prn   15: UTI: E. coli isolated: continue ceftriaxone 2 grams q 24 hours (day #4)             -completed abx--observe   16: Urinary retention: purewick in place             -10/6-continue PVRs thru today perhaps, in and out cath PRN   -pvr's all lesss than 160cc now, continent  -eob to void when possible  -terazosin now at 4 mg at bedtime                17: Hypokalemia: repleted; follow-up BMP -10-8  resolved with daily supplementation daily   18.  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

## 2023-10-09 NOTE — Progress Notes (Signed)
Physical Therapy Session Note  Patient Details  Name: [name] MRN: [id number] Date of Birth: 05-06-1976  Today's Date: 10/09/2023 PT Missed Time: 60 Minutes Missed Time Reason: Patient fatigue  Short Term Goals: Week 1:  PT Short Term Goal 1 (Week 1): Pt will complete transfer min assist x1 PT Short Term Goal 2 (Week 1): Pt will complete bed mobility with min assist PT Short Term Goal 3 (Week 1): Pt will complete WC mobility with supervision 150' PT Short Term Goal 4 (Week 1): Pt will initiate gait training Week 2:     Skilled Therapeutic Interventions/Progress Updates:      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), R hemi, avoid hypotension Restrictions Weight Bearing Restrictions: No General: PT Amount of Missed Time (min): 60 Minutes PT Missed Treatment Reason: Patient fatigue Vital Signs: Therapy Vitals Temp: 98.5 F (36.9 C) Pulse Rate: 79 Resp: 17 BP: (!) 145/68 Patient Position (if appropriate): Sitting Oxygen Therapy SpO2: 100 % O2 Device: Room Air Pain: Pain Assessment Pain Scale: 0-10 Pain Score: 0-No pain Mobility:   Locomotion :    Trunk/Postural Assessment :    Balance:   Exercises:   Other Treatments:      Therapy/Group: Individual Therapy  Loel Dubonnet 10/09/2023, 5:13 PM

## 2023-10-09 NOTE — Progress Notes (Signed)
Speech Language Pathology Daily Session Note  Patient Details  Name: [name] MRN: [id number] Date of Birth: Dec 12, 1976  Today's Date: 10/09/2023 SLP Individual Time: 0902-1000 SLP Individual Time Calculation (min): 58 min  Short Term Goals: Week 2: SLP Short Term Goal 1 (Week 2): Patient will demonstrate functional problem solving for basic and familair tasks with Min verbal and visual cues. SLP Short Term Goal 2 (Week 2): Patient will self-monitor and correct phonemic errors at the word level with Min verbal and visual cues. SLP Short Term Goal 3 (Week 2): Patient will follow 2-step commands with 50% accuracy and Mod verbal cues. SLP Short Term Goal 4 (Week 2): Patient will utilize multimodal communication to express basic wants/needs with Min A multimodal cues. SLP Short Term Goal 5 (Week 2): Patient will name functional items with 70% accuracy and Mod verbal cues.  Skilled Therapeutic Interventions:  Pt was seen in am to address expressive and receptive language. Pt was alert and seen at bedside with nurse present for medication administration. Pt requested toileting soon after. After placing prosthetics, he transferred to bedside commode where he was continent of bowel and bladder. Assigned NT assisted with transfer back to bed. Due to extra time needed for toileting, pt was agreeable to complete portion of session. He named items in his immediate environment with 65% acc with mod cues. He answered biographical questions with ~75% acc with mod A. He followed 2 step directions with 33% acc with mod cues. Pt left at bedside with call button within reach, family present and bed alarm active. SLP to continue POC.   Pain Pain Assessment Pain Scale: 0-10 Pain Score: 0-No pain  Therapy/Group: Individual Therapy  Renaee Munda 10/09/2023, 4:03 PM

## 2023-10-09 NOTE — Progress Notes (Signed)
   10/07/23 1923  What Happened  Was fall witnessed? No  Was patient injured? No  Patient found on floor  Found by Staff-comment Rhunette Croft, NT)  Stated prior activity other (comment) (Patient fell out of wheelchair)  Provider Notification  Provider Name/Title Rhona Raider  Date Provider Notified 10/07/23  Time Provider Notified 1955  Method of Notification Call  Notification Reason Fall  Provider response See new orders (CT scan)  Date of Provider Response 10/07/23  Time of Provider Response 1958  Follow Up  Family notified Yes - comment (Wife)  Time family notified 1930  Additional tests Yes-comment (CT scan)  Simple treatment Other (comment) (assess)  Progress note created (see row info) Yes  Adult Fall Risk Assessment  Risk Factor Category (scoring not indicated) High fall risk per protocol (document High fall risk)  Age 47  Fall History: Fall within 6 months prior to admission 0  Elimination; Bowel and/or Urine Incontinence 2  Elimination; Bowel and/or Urine Urgency/Frequency 2  Medications: includes PCA/Opiates, Anti-convulsants, Anti-hypertensives, Diuretics, Hypnotics, Laxatives, Sedatives, and Psychotropics 5  Patient Care Equipment 3  Mobility-Assistance 2  Mobility-Gait 2  Mobility-Sensory Deficit 2  Altered awareness of immediate physical environment 1  Impulsiveness 2  Lack of understanding of one's physical/cognitive limitations 4  Total Score 25  Patient Fall Risk Level High fall risk  Adult Fall Risk Interventions  Required Bundle Interventions *See Row Information* High fall risk - low, moderate, and high requirements implemented  Additional Interventions Family Supervision;Lap belt while in chair/wheelchair (Rehab only);Use of appropriate toileting equipment (bedpan, BSC, etc.)  Screening for Fall Injury Risk (To be completed on HIGH fall risk patients) - Assessing Need for Floor Mats  Risk For Fall Injury- Criteria for Floor Mats Noncompliant with  safety precautions  Will Implement Floor Mats Yes  Vitals  Temp 98.4 F (36.9 C)  Temp Source Oral  BP (!) 172/74  MAP (mmHg) 101  BP Location Left Arm  BP Method Automatic  Patient Position (if appropriate) Lying  Pulse Rate 76  Pulse Rate Source Monitor  Resp 16  Oxygen Therapy  SpO2 99 %  O2 Device Room Air  Pain Assessment  Pain Scale 0-10  Pain Score 0  Neurological  Neuro (WDL) X  Level of Consciousness Alert  Orientation Level Oriented X4  Cognition Poor safety awareness;Appropriate at baseline  Speech Expressive aphasia  R Pupil Size (mm) 3  R Pupil Shape Round  R Pupil Reaction Brisk  L Pupil Size (mm) 3  L Pupil Shape Round  L Pupil Reaction Brisk  R Hand Grip Weak  L Hand Grip Moderate  RUE Motor Response Non-purposeful movement  RUE Motor Strength 1  LUE Motor Response Purposeful movement  RLE Motor Response Non-purposeful movement  LLE Motor Response Purposeful movement  Integumentary  Integumentary (WDL) WDL  Skin Color Appropriate for ethnicity  Skin Condition Dry  Skin Integrity Intact

## 2023-10-10 DIAGNOSIS — Z89512 Acquired absence of left leg below knee: Secondary | ICD-10-CM | POA: Diagnosis not present

## 2023-10-10 DIAGNOSIS — I639 Cerebral infarction, unspecified: Secondary | ICD-10-CM | POA: Diagnosis not present

## 2023-10-10 DIAGNOSIS — I10 Essential (primary) hypertension: Secondary | ICD-10-CM | POA: Diagnosis not present

## 2023-10-10 DIAGNOSIS — N1832 Chronic kidney disease, stage 3b: Secondary | ICD-10-CM | POA: Diagnosis not present

## 2023-10-10 MED ORDER — MYCOPHENOLATE SODIUM 180 MG PO TBEC
180.0000 mg | DELAYED_RELEASE_TABLET | Freq: Two times a day (BID) | ORAL | Status: DC
  Administered 2023-10-10 – 2023-10-19 (×19): 180 mg via ORAL
  Filled 2023-10-10 (×19): qty 1

## 2023-10-10 NOTE — Progress Notes (Signed)
PROGRESS NOTE   Subjective/Complaints:  Appreciate Neuro note  Tried standing with B BK prosthetics on yesterday  Discussed creat elevation and nephro re eval   ROS: Limited by aphasia although able to communicate better, denies CP, SOB, abd pain, n/v/d/c.  Pertinent positives above.  Objective:   MR BRAIN WO CONTRAST  Result Date: 10/08/2023 CLINICAL DATA:  Stroke follow-up EXAM: MRI HEAD WITHOUT CONTRAST MRA HEAD WITHOUT CONTRAST TECHNIQUE: Multiplanar, multi-echo pulse sequences of the brain and surrounding structures were acquired without intravenous contrast. Angiographic images of the Circle of Willis were acquired using MRA technique without intravenous contrast. COMPARISON:  09/18/2023 FINDINGS: MRI HEAD FINDINGS Brain: There is no abnormal diffusion restriction to indicate acute infarct. However, there are multiple new areas of edema within the left frontal and parietal cortices and within the deep white matter of the left hemisphere. There is cortical petechial hemorrhage at the sites of new edema. No mass effect or hydrocephalus. There is multifocal hyperintense T2-weighted signal within the white matter. Generalized volume loss. The midline structures are normal. Skull and upper cervical spine: Normal calvarium and skull base. Visualized upper cervical spine and soft tissues are normal. Sinuses/Orbits:No paranasal sinus fluid levels or advanced mucosal thickening. No mastoid or middle ear effusion. Normal orbits. MRA HEAD FINDINGS POSTERIOR CIRCULATION: --Vertebral arteries: Normal --Inferior cerebellar arteries: Normal. --Basilar artery: Normal. --Superior cerebellar arteries: Normal. --Posterior cerebral arteries: Normal. ANTERIOR CIRCULATION: --Intracranial internal carotid arteries: Normal. --Anterior cerebral arteries (ACA): Normal. --Middle cerebral arteries (MCA): Normal. IMPRESSION: 1. Multiple new areas of edema within the  left frontal and parietal cortices and deep white matter of the left hemisphere. As these sites are all in the left MCA territory, they are favored to be due to subacute ischemia. 2. Cortical petechial hemorrhage at the sites of new edema. 3. Normal intracranial MRA. Electronically Signed   By: Deatra Robinson M.D.   On: 10/08/2023 20:21   MR ANGIO HEAD WO CONTRAST  Result Date: 10/08/2023 CLINICAL DATA:  Stroke follow-up EXAM: MRI HEAD WITHOUT CONTRAST MRA HEAD WITHOUT CONTRAST TECHNIQUE: Multiplanar, multi-echo pulse sequences of the brain and surrounding structures were acquired without intravenous contrast. Angiographic images of the Circle of Willis were acquired using MRA technique without intravenous contrast. COMPARISON:  09/18/2023 FINDINGS: MRI HEAD FINDINGS Brain: There is no abnormal diffusion restriction to indicate acute infarct. However, there are multiple new areas of edema within the left frontal and parietal cortices and within the deep white matter of the left hemisphere. There is cortical petechial hemorrhage at the sites of new edema. No mass effect or hydrocephalus. There is multifocal hyperintense T2-weighted signal within the white matter. Generalized volume loss. The midline structures are normal. Skull and upper cervical spine: Normal calvarium and skull base. Visualized upper cervical spine and soft tissues are normal. Sinuses/Orbits:No paranasal sinus fluid levels or advanced mucosal thickening. No mastoid or middle ear effusion. Normal orbits. MRA HEAD FINDINGS POSTERIOR CIRCULATION: --Vertebral arteries: Normal --Inferior cerebellar arteries: Normal. --Basilar artery: Normal. --Superior cerebellar arteries: Normal. --Posterior cerebral arteries: Normal. ANTERIOR CIRCULATION: --Intracranial internal carotid arteries: Normal. --Anterior cerebral arteries (ACA): Normal. --Middle cerebral arteries (MCA): Normal. IMPRESSION: 1. Multiple new areas of edema within the  left frontal and  parietal cortices and deep white matter of the left hemisphere. As these sites are all in the left MCA territory, they are favored to be due to subacute ischemia. 2. Cortical petechial hemorrhage at the sites of new edema. 3. Normal intracranial MRA. Electronically Signed   By: Deatra Robinson M.D.   On: 10/08/2023 20:21   EEG adult  Result Date: 10/08/2023 Charlsie Quest, MD     10/08/2023  5:53 PM Patient Name: [name] MRN: [id number] Epilepsy Attending: Charlsie Quest Referring Physician/Provider: Gordy Councilman, MD Date: 10/08/2023 Duration: 26.23 mins Patient history: 47 yo M with scattered small left hemisphere infarcts on brain MRI last month, left MCA infarct petechial hemorrhage on head CT yesterday after fall. EEG to evaluate for seizure. Level of alertness: Awake, asleep AEDs during EEG study: None Technical aspects: This EEG study was done with scalp electrodes positioned according to the 10-20 International system of electrode placement. Electrical activity was reviewed with band pass filter of 1-70Hz , sensitivity of 7 uV/mm, display speed of 46mm/sec with a 60Hz  notched filter applied as appropriate. EEG data were recorded continuously and digitally stored.  Video monitoring was available and reviewed as appropriate. Description: The posterior dominant rhythm consists of 7.5 Hz activity of moderate voltage (25-35 uV) seen predominantly in posterior head regions, symmetric and reactive to eye opening and eye closing. Sleep was characterized by vertex waves, sleep spindles (12 to 14 Hz), maximal frontocentral region. EEG showed intermittent generalized and lateralized left hemisphere 3 to 6 Hz theta-delta slowing. Hyperventilation and photic stimulation were not performed.   ABNORMALITY - Intermittent slow, generalized and lateralized left hemisphere IMPRESSION: This study is suggestive of cortical dysfunction arising from left hemisphere likely secondary to underlying stroke.  Additionally there is mild diffuse encephalopathy. No seizures or epileptiform discharges were seen throughout the recording. Charlsie Quest   Recent Labs    10/08/23 0912 10/09/23 0530  WBC 7.7 7.3  HGB 12.5* 12.6*  HCT 39.9 40.4  PLT 495* 461*    Recent Labs    10/09/23 0530  NA 139  K 4.2  CL 110  CO2 22  GLUCOSE 121*  BUN 34*  CREATININE 3.03*  CALCIUM 8.9     Intake/Output Summary (Last 24 hours) at 10/10/2023 0750 Last data filed at 10/10/2023 0730 Gross per 24 hour  Intake 1171 ml  Output --  Net 1171 ml        Physical Exam: Vital Signs Blood pressure (!) 147/67, pulse 73, temperature 98.8 F (37.1 C), resp. rate 16, height 5\' 5"  (1.651 m), weight 79.8 kg, SpO2 98%.   General: No acute distress, resting in bed.  Mood and affect are appropriate Heart: Regular rate and rhythm no rubs murmurs or extra sounds Lungs: Clear to auscultation, breathing unlabored, no rales or wheezes Abdomen: Positive bowel sounds, soft nontender to palpation, nondistended Extremities: No clubbing, cyanosis, or edema. B/L BKAs.  Skin: No evidence of breakdown, no evidence of rash  PRIOR EXAMS: + aphasia, fluent with neologisms , able to repeat, names 1/3 simple objects and with first letter cues could name 3/3, correctly + Apraxic and right inattention.  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 RLE 4/5 HF, 3- KE/KF LUE and LLE grossly antigravity and against resistance, 5 out of 5   Musculoskeletal: residual limbs well shaped.  Bilateral BKA   Assessment/Plan: 1. Functional deficits which require 3+ hours  per day of interdisciplinary therapy in a comprehensive inpatient rehab setting. Physiatrist is providing close team supervision and 24 hour management of active medical problems listed below. Physiatrist and rehab team continue to assess barriers to discharge/monitor patient progress toward functional and medical  goals  Care Tool:  Bathing    Body parts bathed by patient: Right arm, Chest, Abdomen, Front perineal area, Buttocks, Right upper leg, Left upper leg, Face   Body parts bathed by helper: Left arm Body parts n/a: Left lower leg, Right lower leg   Bathing assist Assist Level: Maximal Assistance - Patient 24 - 49%     Upper Body Dressing/Undressing Upper body dressing   What is the patient wearing?: Pull over shirt    Upper body assist Assist Level: Moderate Assistance - Patient 50 - 74%    Lower Body Dressing/Undressing Lower body dressing      What is the patient wearing?: Pants, Underwear/pull up     Lower body assist Assist for lower body dressing: Maximal Assistance - Patient 25 - 49%     Toileting Toileting    Toileting assist Assist for toileting: Maximal Assistance - Patient 25 - 49%     Transfers Chair/bed transfer  Transfers assist  Chair/bed transfer activity did not occur: Safety/medical concerns  Chair/bed transfer assist level: Moderate Assistance - Patient 50 - 74% (squat pivot)     Locomotion Ambulation   Ambulation assist   Ambulation activity did not occur: Safety/medical concerns          Walk 10 feet activity   Assist  Walk 10 feet activity did not occur: Safety/medical concerns        Walk 50 feet activity   Assist Walk 50 feet with 2 turns activity did not occur: Safety/medical concerns         Walk 150 feet activity   Assist Walk 150 feet activity did not occur: Safety/medical concerns         Walk 10 feet on uneven surface  activity   Assist Walk 10 feet on uneven surfaces activity did not occur: Safety/medical concerns         Wheelchair     Assist Is the patient using a wheelchair?: Yes Type of Wheelchair: Manual    Wheelchair assist level: Moderate Assistance - Patient 50 - 74% Max wheelchair distance: 150    Wheelchair 50 feet with 2 turns activity    Assist        Assist  Level: Moderate Assistance - Patient 50 - 74%   Wheelchair 150 feet activity     Assist      Assist Level: Moderate Assistance - Patient 50 - 74%   Blood pressure (!) 147/67, pulse 73, temperature 98.8 F (37.1 C), resp. rate 16, height 5\' 5"  (1.651 m), weight 79.8 kg, SpO2 98%.  Medical Problem List and Plan: 1. 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  Antithrombotics: ASA 81mg  resumed per neuro recs ,  recheck CT head on 10/16 to see if plavix can be resumed.  Sq heparin for DVT prophyllaxis started today  -DVT/anticoagulation:  see above  -antiplatelet therapy: Aspirin and Plavix for three months followed by aspirin alone (started 9/26)- See above    3. Pain Management: Tylenol as needed   4. Mood/Behavior/Sleep: LCSW to evaluate and provide emotional support             -antipsychotic agents: n/a   -Melatonin 5mg  at bedtime  5. Neuropsych/cognition: This patient is not quite capable of making decisions on his own behalf.   -language improving. -may have capacity soon.   6. Skin/Wound Care: Routine skin care checks -Discussed with nursing good skin check of bilateral residual limbs once in bed at rehab   7. Fluids/Electrolytes/Nutrition: strict Is and Os and follow-up chemistries   8: Hypertension: monitor TID and prn             -continue hydralazine 50 mg BID             -continue carvedilol 6.25 mg BID             -continue terazosin 4 mg q HS   -10/7: Increase Hydralazine  to 50 mg TID -10/11 good  control , improving   -10/12-13/24 BPs looking good, cont regimen Vitals:   10/08/23 0300 10/08/23 0418 10/08/23 0618 10/08/23 1545  BP: (!) 143/70 132/70 127/61 (!) 166/75   10/08/23 1737 10/08/23 1738 10/09/23 0446 10/09/23 0855  BP: (!) 164/77 (!) 164/77 (!) 143/70 (!) 140/71   10/09/23 1320 10/09/23 1727 10/09/23 1928 10/10/23 0438  BP: (!) 145/68 (!) 144/71 (!) 143/70 (!) 147/67  Mild sys elevation    9: Hyperlipidemia: continue rosuvastatin 20mg  daily   10: 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 Renal signed off -myfortic stopped in setting of bacteremia per his transplant center-- will need to be restarted at previous dose at d/c              -Start IVF 10/9- recheck in am   -10/07/23 Cr 2.39 yesterday; monitor with routine labs Creat trending up will ask nephro to follow  13: GERD: continue protonix 40mg  BID   14: DM s/p pancreas transplant: CBGs QID; A1c = 5.8% (no home meds listed)             -discontinue SSI             -started carb modified diet- pt refuses now on reg diet (same as home)    - well controlled, continue current regimen 10/10  15: Nausea: resolved -discontinue Reglan -continue PPI -Zofran prn   15: UTI: E. coli isolated: continue ceftriaxone 2 grams q 24 hours (day #4)             -  completed abx--observe   16: Urinary retention: purewick in place              -10/6-continue PVRs thru today perhaps, in and out cath PRN   -pvr's all lesss than 160cc now, continent  -eob to void when possible  -terazosin now at 4 mg at bedtime               17: Hypokalemia: repleted; follow-up BMP -10-8 resolved with daily supplementation daily   18.  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

## 2023-10-10 NOTE — Progress Notes (Signed)
Occupational Therapy Session Note  Patient Details  Name: [name] MRN: [id number] Date of Birth: [birth year]  Today's Date: 10/10/2023 OT Individual Time: 4132-4401 OT Individual Time Calculation (min): 40 min    Short Term Goals: Week 1:  OT Short Term Goal 1 (Week 1): Patient will complete toilet transfer with mod A of 1 OT Short Term Goal 1 - Progress (Week 1): Met OT Short Term Goal 2 (Week 1): Patient will recall hemi-dressing techniques with min cues OT Short Term Goal 2 - Progress (Week 1): Progressing toward goal OT Short Term Goal 3 (Week 1): Patient will complete 1 step of LB dressing task OT Short Term Goal 3 - Progress (Week 1): Met Week 2:  OT Short Term Goal 1 (Week 2): Pt will don a t shirt with min A demonstrating improved motor planning and R side awareness. OT Short Term Goal 2 (Week 2): Pt will complete squat pivot transfers to toilet and or BSC with min A. OT Short Term Goal 3 (Week 2): Pt will be able to use R hand to grasp wash cloth while helper assists moving arm to wash his L arm  Skilled Therapeutic Interventions/Progress Updates:    Pt received in bed ready for therapy.  Focus of therapy session on balance and postural control with self care skills and using his RUE as a stabilizing A.      Upon pt turning in bed, noticed some small soiling of sheets.  Had pt work on LB bathing from bed to ensure he could be thoroughly cleansed.  Min A to wash R buttock only but pt able to reach all other areas of LB.  Washed UB sitting upright on bed maintaining core strength.  Min A to wash L arm. Hand over hand guiding with R hand to wash L arm with pt holding onto washcloth.  Only trace movement in elbow.  Donned underwear from supine with min A and mod A for thicker cargo shorts with a belt.  Pt sat at EOB and held balance as OT assisted with donning prosthetics. Pt then followed min cues to don shirt with only min A.   Pt cued to do a squat pivot to his R to  w/c.  Initially he was sliding on bed and with visual demonstration to lift his hips, pt was able to do so and transferred to R with only CGA.   Had pt practice reading date on calendar.  Pt asking for something that the RN and I could not initially understand and then he did clearly state "water" and we understood he was pointing to a gatorade bottle.    His wife brought in a sling for his arm, in back of wc to use during ambulation.    Pt resting in wc with all needs met.  telesitter on, call light in reach. OT session had ended slightly early as RN providing medications. RN in room with pt at 913, PT arriving at 915.    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), R hemi, avoid hypotension Restrictions Weight Bearing Restrictions: No    Vital Signs: Therapy Vitals Temp: 98.8 F (37.1 C) Pulse Rate: 73 Resp: 16 BP: (!) 147/67 Patient Position (if appropriate): Lying Oxygen Therapy SpO2: 98 % O2 Device: Room Air Pain:  No c/o pain     Therapy/Group: Individual Therapy  [name] 10/10/2023, 8:26 AM

## 2023-10-10 NOTE — Progress Notes (Signed)
Physical Therapy Session Note  Patient Details  Name: [name] MRN: [id number] Date of Birth: 04/05/1976  Today's Date: 10/10/2023 PT Individual Time: 1503-1601 PT Individual Time Calculation (min): 58 min   Short Term Goals: Week 1:  PT Short Term Goal 1 (Week 1): Pt will complete transfer min assist x1 PT Short Term Goal 1 - Progress (Week 1): Met PT Short Term Goal 2 (Week 1): Pt will complete bed mobility with min assist PT Short Term Goal 2 - Progress (Week 1): Progressing toward goal PT Short Term Goal 3 (Week 1): Pt will complete WC mobility with supervision 150' PT Short Term Goal 3 - Progress (Week 1): Partly met PT Short Term Goal 4 (Week 1): Pt will initiate gait training PT Short Term Goal 4 - Progress (Week 1): Met Week 2:  PT Short Term Goal 1 (Week 2): Pt will complete sit<>stand to standing balance with Mod A +1 PT Short Term Goal 2 (Week 2): Pt will complete squat pivot with overall MinA. PT Short Term Goal 3 (Week 2): Pt will complete bed mobility with MinA. PT Short Term Goal 4 (Week 2): Pt will complete WC mobility with supervision more than 150 ft. PT Short Term Goal 5 (Week 2): Pt will ambulate at least 100 ft using HW with ModA.   Skilled Therapeutic Interventions/Progress Updates:  Patient seated upright in w/c on entrance to room. Patient alert and agreeable to PT session.   Patient with no pain complaint at start of session.  Therapeutic Activity: Pt dons L prosthetic with setup and requires Min/ ModA for R prosthetic d/t one handedness. Continues to have difficulty with pressure of residual limb into R prosthetic for proper pin insertion.  Transfers: Pt performed sit<>stand transfers during session with MinA and max cues for setup prior to performance. Stand pivot transfers during session using HW and MAxA for balance. Cues for setup.  Gait Training/ NMR:  Pt ambulated 65 ft using eva walker with MaxA for RLE advancement and managing walker.  Demonstrated forward flexed posture and inability to manage control of new style of walker. Required brief standing rest break in middle of bout. Return distance using HW with continued need for vc to sequence L lean for stability, MaxA to advance RLE with ability to hold R knee in flexed position during stance phase. Overall ModA with intermittent MaxA for balance.  Wheelchair Mobility:  Pt propelled wheelchair >200 feet with supervision and vc for hemitechnique initially in turns but improves to no vc. Pt challenged with slalom course through 6 cones placed 5 ft apart. Pt requires visual and verbal cues for awareness of cones to R otherwise propels w/c over top of cone. Completes x2 with vc throughout for environmental awareness and technique.  Pt also requires max cues to find R sided brake lever, attachment site for leg rest, and release for leg rest to swing out. Is unable to locate on own and does not fully turn to R side of w/c. Will attempt to look under seat for all handles to R side.   Neuromuscular Re-ed: NMR facilitated during session with focus on standing balance and environmental scanning during w/c mobility and parts management. Pt guided in standing performance of RLE fwd/ bkwd stepping. Required facilitation of R hip fwd translation to perform. By end of blocked practice, pt with improved initiation but will require more NMR to complete initiation of step with improved floor clearance. NMR performed for improvements in motor control and coordination, balance,  sequencing, judgement, and self confidence/ efficacy in performing all aspects of mobility at highest level of independence.    Patient seated upright in w/c at end of session with brakes locked, belt alarm set, and all needs within reach.   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), R hemi, avoid hypotension Restrictions Weight  Bearing Restrictions: No  Pain: Pain Assessment Pain Scale: 0-10 Pain Score: 0-No pain  Therapy/Group: Individual Therapy  Loel Dubonnet PT, DPT, CSRS 10/10/2023, 6:28 PM

## 2023-10-10 NOTE — Progress Notes (Signed)
[name] Progress Note   Assessment/ Plan:   # AKI  - may be hemodynamic/ischemic ATN in the setting of severe allergic reaction requiring intubation as well as pre-renal insults from gram negative bacteremia.  Transplant ultrasound without hydro - Baseline Cr per the [facility] system appears to be in the high 1's.  Creatinine during this hospitalization has been around 2.2-2.5 most recently. -Creatinine slightly higher at this time.  Encouraged hydration and will repeat levels tomorrow. 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  Increased cyclosporine to 100 mg BID.  Per his transplant center goal cyclosporine level is 50-125.  We can recheck cyclosporine with labs tomorrow - cyclosporine trough from 10/4 am is at goal   # Pancreas transplant - note hyperglycemia  - amylase and lipase ok  - glucose management per primary team    # E coli bacteremia - per 09/23/23 blood cultures  - abx per primary team  - stopped myfortic as above given bacteremia  - repeat blood cultures from 10/4 are no growth to date -Safe to restart Myfortic   # HTN  -Blood pressure slightly elevated.  Continue to monitor for now   # Urinary retention - s/p in/out cath -Continue to monitor PVRs - terazosin 4 mg daily   # Allergic reaction - to  belatacept  - note his regimen has been adjusted (stopped bela, transitioned to cyclosporine)   # Hypokalemia - improved with repletion   Subjective:    Patient feels well today.  Denies nausea or vomiting.  No diarrhea.  No graft pain or dysuria.   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  Labs: BMET Recent Labs  Lab 10/04/23 0456 10/05/23 0641 10/09/23 0530  NA 137 135 139  K 4.3 4.6 4.2  CL 106 107 110  CO2 24 20* 22  GLUCOSE 119* 130* 121*  BUN 33* 36* 34*  CREATININE 2.47* 2.39* 3.03*  CALCIUM 8.6* 8.6* 8.9   CBC Recent Labs  Lab 10/08/23 0912 10/09/23 0530  WBC 7.7 7.3  HGB 12.5* 12.6*  HCT 39.9 40.4  MCV 97.6 97.1  PLT 495* 461*      Medications:     aspirin EC  81 mg Oral Daily   carvedilol  6.25 mg Oral BID WC   cycloSPORINE modified  100 mg Oral BID   heparin injection (subcutaneous)  5,000 Units Subcutaneous  Q8H   hydrALAZINE  50 mg Oral BID   melatonin  5 mg Oral QHS   pantoprazole  40 mg Oral BID   potassium chloride  10 mEq Oral Daily   predniSONE  5 mg Oral Daily   rosuvastatin  20 mg Oral Daily   senna-docusate  1 tablet Oral QHS   sodium chloride flush  3 mL Intravenous Q12H   sodium chloride flush  3 mL Intravenous Q12H   sulfamethoxazole-trimethoprim  1 tablet Oral Q M,W,F   terazosin  4 mg Oral QHS

## 2023-10-10 NOTE — Progress Notes (Signed)
Speech Language Pathology Daily Session Note  Patient Details  Name: [name] MRN: [id number] Date of Birth: 1976/02/23  Today's Date: 10/10/2023 SLP Individual Time: 1100-1200 SLP Individual Time Calculation (min): 60 min  Short Term Goals: Week 2: SLP Short Term Goal 1 (Week 2): Patient will demonstrate functional problem solving for basic and familair tasks with Min verbal and visual cues. SLP Short Term Goal 2 (Week 2): Patient will self-monitor and correct phonemic errors at the word level with Min verbal and visual cues. SLP Short Term Goal 3 (Week 2): Patient will follow 2-step commands with 50% accuracy and Mod verbal cues. SLP Short Term Goal 4 (Week 2): Patient will utilize multimodal communication to express basic wants/needs with Min A multimodal cues. SLP Short Term Goal 5 (Week 2): Patient will name functional items with 70% accuracy and Mod verbal cues.  Skilled Therapeutic Interventions: Skilled therapy session focused on communicative goals. SLP facilitated session through providing minA during functional phrase completion tasks. SLP increased complexity of task by having patient name items described. Initially, patient required mod-maxA, however required only supervision A when given written choices. SLP continued to address communication through automatic speech tasks. Patient independently counted from 1-10, and required minA to state days of the week and months of the year. Of note, patient with increased awareness of mistakes this date, requiring occasional breaks due to frustration. Patient with fluent phrases at the end of the session stating "what are you eating for lunch?" and "leave the door open." Patient left in chair with alarm set and call bell in reach. Continue POC.   Pain No pain reported   Therapy/Group: Individual Therapy  [name] M.A., CF-SLP 10/10/2023, 12:17 PM

## 2023-10-10 NOTE — Progress Notes (Signed)
Physical Therapy Session Note  Patient Details  Name: [name] MRN: [id number] Date of Birth: 03/06/1976  Today's Date: 10/10/2023 PT Individual Time: 0921-1002 PT Individual Time Calculation (min): 41 min   Short Term Goals: Week 2:  PT Short Term Goal 1 (Week 2): Pt will complete sit<>stand to standing balance with Mod A +1 PT Short Term Goal 2 (Week 2): Pt will complete squat pivot with overall MinA. PT Short Term Goal 3 (Week 2): Pt will complete bed mobility with MinA. PT Short Term Goal 4 (Week 2): Pt will complete WC mobility with supervision more than 150 ft. PT Short Term Goal 5 (Week 2): Pt will ambulate at least 100 ft using HW with ModA. Week 3:     Skilled Therapeutic Interventions/Progress Updates: Patient sitting in WC on entrance to room. Patient alert and agreeable to PT session.   Patient reported no pain at beginning of PT session.  Therapeutic Activity: Transfers: Pt performed stand pivot transfer to the R WC<edge of mat with light minA to stand, and heavy modA to pivot to mat with VC for R LE advancement accordingly. Pt squat pivot to the L from edge of mat<WC with close supervision and set up of WC.   Neuromuscular Re-ed: NMR facilitated during session with focus on R LE neuromuscular connection, static standing balance and weight shifting. - Attempted e-stim but pt reported increased uncomfortable/slight pain (short sitting on edge of mat). E-stim deferred and moved to next NMR - Short sitting with PNF contract relax with PTA providing min resistance in the direction of knee extension (R LE). Pt performed multiple rounds and then provided with rest break. Pt then performed LAQ without resistance with notable increase in ROM (pt asked to perform LAQ prior to e-stim with notable contraction past 90* vs when this PTA last performed this task with patient last week where there was no movement past 90*). Pt performed x 4 LAQ (ROM decreased due to fatigue). Pt  with rest break. - 5 x sit to stand from edge of mat with pt cued to perform sequence without PTA cuing. Pt with light minA to stand, but modA to maintain upright standing balance with multimodal cues to extend through hip for upright posture (pt with L hip out laterally).   NMR performed for improvements in motor control and coordination, balance, sequencing, judgement, and self confidence/ efficacy in performing all aspects of mobility at highest level of independence.   Patient sitting in WC at end of session with brakes locked, belt alarm set, and all needs within reach.   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), R hemi, avoid hypotension Restrictions Weight Bearing Restrictions: No  Therapy/Group: Individual Therapy  Ashaunte Standley PTA 10/10/2023, 12:42 PM

## 2023-10-11 ENCOUNTER — Inpatient Hospital Stay (HOSPITAL_COMMUNITY): Payer: 59

## 2023-10-11 DIAGNOSIS — N1832 Chronic kidney disease, stage 3b: Secondary | ICD-10-CM | POA: Diagnosis not present

## 2023-10-11 DIAGNOSIS — I639 Cerebral infarction, unspecified: Secondary | ICD-10-CM | POA: Diagnosis not present

## 2023-10-11 DIAGNOSIS — I10 Essential (primary) hypertension: Secondary | ICD-10-CM | POA: Diagnosis not present

## 2023-10-11 DIAGNOSIS — Z89512 Acquired absence of left leg below knee: Secondary | ICD-10-CM | POA: Diagnosis not present

## 2023-10-11 LAB — RENAL FUNCTION PANEL
Albumin: 2.6 g/dL — ABNORMAL LOW (ref 3.5–5.0)
Anion gap: 7 (ref 5–15)
BUN: 33 mg/dL — ABNORMAL HIGH (ref 6–20)
CO2: 22 mmol/L (ref 22–32)
Calcium: 8.9 mg/dL (ref 8.9–10.3)
Chloride: 107 mmol/L (ref 98–111)
Creatinine, Ser: 2.74 mg/dL — ABNORMAL HIGH (ref 0.61–1.24)
GFR, Estimated: 28 mL/min — ABNORMAL LOW (ref >60)
Glucose, Bld: 130 mg/dL — ABNORMAL HIGH (ref 70–99)
Phosphorus: 3.9 mg/dL (ref 2.5–4.6)
Potassium: 4.6 mmol/L (ref 3.5–5.1)
Sodium: 136 mmol/L (ref 135–145)

## 2023-10-11 MED ORDER — CLOPIDOGREL BISULFATE 75 MG PO TABS
75.0000 mg | ORAL_TABLET | Freq: Every day | ORAL | Status: DC
  Administered 2023-10-11 – 2023-10-19 (×9): 75 mg via ORAL
  Filled 2023-10-11 (×10): qty 1

## 2023-10-11 MED ORDER — DEXTROSE-SODIUM CHLORIDE 5-0.45 % IV SOLN: INTRAVENOUS | Status: DC

## 2023-10-11 NOTE — Progress Notes (Signed)
[facility] Progress Note   Assessment/ Plan:   # AKI  - may be hemodynamic/ischemic ATN in the setting of severe allergic reaction requiring intubation as well as pre-renal insults from gram negative bacteremia.  Transplant ultrasound without hydro - Baseline Cr per the [facility] appears to be in the high 1's.  Creatinine during this hospitalization has been around 2.2-2.5 most recently. -Creatinine slightly higher at this time.  Creatinine improved today.  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  Increased cyclosporine to 100 mg BID.  Per his transplant center goal cyclosporine level is 50-125.  We we will follow-up repeat cyclosporine trough - cyclosporine trough from 10/4 am is at goal   # Pancreas transplant - note hyperglycemia  - amylase and lipase ok  - glucose management per primary team    # E coli bacteremia - per 09/23/23 blood cultures  - abx per primary team  - stopped myfortic as above given bacteremia  - repeat blood cultures from 10/4 are no growth to date -Safe to restart Myfortic   # HTN  -Blood pressure slightly elevated.  Continue to monitor for now   # Urinary retention - s/p in/out cath -Continue to monitor PVRs - terazosin 4 mg  daily   # Allergic reaction - to belatacept  - note his regimen has been adjusted (stopped bela, transitioned to cyclosporine)   # Hypokalemia - improved with repletion   Subjective:    Patient feels well today.  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  Labs: BMET Recent Labs  Lab 10/05/23 0641 10/09/23 0530 10/11/23 0628  NA 135 139 136  K 4.6 4.2 4.6  CL 107 110 107  CO2 20* 22 22  GLUCOSE 130* 121* 130*  BUN 36* 34* 33*  CREATININE 2.39* 3.03* 2.74*  CALCIUM 8.6* 8.9 8.9  PHOS  --   --  3.9   CBC Recent Labs  Lab 10/08/23 0912 10/09/23 0530  WBC 7.7 7.3  HGB 12.5* 12.6*  HCT 39.9 40.4  MCV 97.6 97.1  PLT 495* 461*      Medications:     aspirin EC  81 mg Oral Daily   carvedilol  6.25 mg Oral BID WC   clopidogrel  75 mg Oral Daily  cycloSPORINE modified  100 mg Oral BID   heparin injection (subcutaneous)  5,000 Units Subcutaneous Q8H   hydrALAZINE  50 mg Oral BID   melatonin  5 mg Oral QHS   mycophenolate  180 mg Oral BID   pantoprazole  40 mg Oral BID   potassium chloride  10 mEq Oral Daily   predniSONE  5 mg Oral Daily   rosuvastatin  20 mg Oral Daily   senna-docusate  1 tablet Oral QHS   sodium chloride flush  3 mL Intravenous Q12H   sodium chloride flush  3 mL Intravenous Q12H   sulfamethoxazole-trimethoprim  1 tablet Oral Q M,W,F   terazosin  4 mg Oral QHS

## 2023-10-11 NOTE — Progress Notes (Signed)
Occupational Therapy Session Note  Patient Details  Name: [name] MRN: [id number] Date of Birth: 1976/04/05  Today's Date: 10/11/2023 OT Individual Time: 1402-1505 OT Individual Time Calculation (min): 63 min    Short Term Goals: Week 1:  OT Short Term Goal 1 (Week 1): Patient will complete toilet transfer with mod A of 1 OT Short Term Goal 1 - Progress (Week 1): Met OT Short Term Goal 2 (Week 1): Patient will recall hemi-dressing techniques with min cues OT Short Term Goal 2 - Progress (Week 1): Progressing toward goal OT Short Term Goal 3 (Week 1): Patient will complete 1 step of LB dressing task OT Short Term Goal 3 - Progress (Week 1): Met  Skilled Therapeutic Interventions/Progress Updates:  Pt greeted seated in w/c, pt agreeable to OT intervention.      Transfers/bed mobility: worked on squat pivot transfers to [employer] in room as pt wanting to take shower this week and pt has TTB where pt would complete same squat pivot transfer into shower at home. Pt completed squat pivot into shower to L side with MODA, MAX verbal/visual cues needed for hand placement and set- up. Pt transferred out of shower to R side with pt needing MIN verbal cues for head/hips relationship during transfer, MODA overall to exit shower.   ADLs:  Toileting: pt indicated need to void bladder, pt able to stand in front of toilet with MODA while pt voided needing MAX A for 3/3 toileting tasks d/t pt needing assist to manage pants/belt etc.    NMR: worked on active assist ROM exercises from sitting with pt instructed to roll 3 lb weighted ball to R<>L and forward/backwards to facilitate improved AROM via elbow flexion/extension and scapular protraction/retraction pt needed support at R elbow to keep RUE on ball.   Worked on functional grasp and targeted reach/ AROM with pt instructed to place cones on correlating targets on table. Pt needed cone to be placed in his hand and assist needed to fully  achieve active shoulder movement during functional reach but able l to grasp and release cones with only supervision with MIN cues for technique. Pt completed task 2x stating " I need more practice."   Worked on active assist ROM with pt able to grasp 1lb weighted bar and complete chest presses x5 reps however pt reports pain in L shoulder, provided K tape to R subluxed shoulder for pain mgmt.   Ended session with pt seated in w/c with all needs within reach and safety belt alarm activated.                   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), R hemi, avoid hypotension Restrictions Weight Bearing Restrictions: No  Pain: Unrated pain indicated in R shoulder, provided K tape at R shoulder to provide increased joint support.    Therapy/Group: Individual Therapy  [name] 10/11/2023, 3:27 PM

## 2023-10-11 NOTE — Progress Notes (Signed)
Patient ID: [name], male   DOB: Jul 20, 1976, 47 y.o.   MRN: [id number]   Team Conference Report to Patient/Family  Team Conference discussion was reviewed with the patient and caregiver, including goals, any changes in plan of care and target discharge date.  Patient and caregiver express understanding and are in agreement.  The patient has a target discharge date of 10/19/23.  SW met with patient and spoke with spouse via telephone. SW provided pt and spouse with updated discharge date, both pleased. Spouse will be present on Saturday 10/ 19 9-12 to complete family education. Spouse encouraging timed toileting for patient. No additional questions or concerns.   Andria Rhein 10/11/2023, 1:41 PM

## 2023-10-11 NOTE — Progress Notes (Signed)
Physical Therapy Session Note  Patient Details  Name: [name] MRN: [id number] Date of Birth: 27-Aug-1976  Today's Date: 10/11/2023 PT Individual Time: 1101-1204 PT Individual Time Calculation (min): 63 min   Short Term Goals: Week 1:  PT Short Term Goal 1 (Week 1): Pt will complete transfer min assist x1 PT Short Term Goal 1 - Progress (Week 1): Met PT Short Term Goal 2 (Week 1): Pt will complete bed mobility with min assist PT Short Term Goal 2 - Progress (Week 1): Progressing toward goal PT Short Term Goal 3 (Week 1): Pt will complete WC mobility with supervision 150' PT Short Term Goal 3 - Progress (Week 1): Partly met PT Short Term Goal 4 (Week 1): Pt will initiate gait training PT Short Term Goal 4 - Progress (Week 1): Met Week 2:  PT Short Term Goal 1 (Week 2): Pt will complete sit<>stand to standing balance with Mod A +1 PT Short Term Goal 2 (Week 2): Pt will complete squat pivot with overall MinA. PT Short Term Goal 3 (Week 2): Pt will complete bed mobility with MinA. PT Short Term Goal 4 (Week 2): Pt will complete WC mobility with supervision more than 150 ft. PT Short Term Goal 5 (Week 2): Pt will ambulate at least 100 ft using HW with ModA.  Skilled Therapeutic Interventions/Progress Updates:  Patient supine in bed on entrance to room. Asleep but easily roused and ready for session.     Patient with no pain complaint at start of session.  Therapeutic Activity/ NMR: Bed Mobility: Pt performed supine > sit with CGA. Reaches prosthetics and dons L with Mod I for time. Continues to require MinA to don R prosthetic. Transfers: Blocked practice for squat pivot transfers in order to improve level of assist prior to d/c home with wife next week. Pt is still impulsive so focus also spent on correct steps for setup prior to transfer as pt did have fall over past weekend. Pt performs w/c>mat table to L side  and return to R side into w/c with light MinA and improving to CGA.  Time spent on education of return to R/ affected side. Then w/c turned and blocked practice of w/c to mat table transfer toward R side and back to w/c toward L side. Difficulty initially with push of L hemibody and wants to stand. With improved R foot positioning pt improves to MinA and then near CGA with repetition. Overall CGA mat to w/c. Pt does require vc for setup  of positioning and security of w/c. Extensive vc and visual cues initially and improvement to requiring less vc to complete correctly.   Pt performed sit<>stand transfers to RW throughout session with MinA for power up and ModA to attain balance.   Gait Training:  Pt ambulated 45 ft using RW!! with R hand saddle splint. Demonstrated improved UB balance requiring ModA for balance and maxA for RLE advancement. Continued ability to hold RLE in flexed position and not requiring guard. However requires assist for appropriate RW advancement. With vc, pt is able to attempt corrections on own.   Wheelchair Mobility:  Pt propelled wheelchair >200 feet with supervision. Provided vc/ tc for managing obstacles on pt's R side. Without cueing pt unaware of obstacles. However, does demonstrate ability to effectively maneuver obstacles he sees directly ahead of him and clear with instruction to keep obstacle to pt's R side.   Continued trouble with locating brake lever and handle for leg rest on R side of  w/c. Requires continued MaxA to locate d/t pt's desire to look under chair between his legs. Wrapped yellow coban around brake lever and pink around lever for leg rest. With coban pt requires decreased cueing to locate each and initiate search toward R side instead of under w/c. Will require continued training.   Wheelchair [employer] contacted for appointment for custom lightweight w/c assessment. And scheduled for 10am next Tuesday 10/22.  Neuromuscular Re-ed: NMR facilitated during session with focus on standing balance, motor control, and  proprioception. Pt guided in stand to RW and maintaining balance requiring RLE knee extension. Although pt is able to maintain without buckle during ambulation, pt is unable to engage with static stance. Requires up to MaxA for guard/block of RLE and attempt to maintain midline orientation. Pt does attempt with bringing body to midline, but minimal reactive response to LOB with R hemibody. RUE attached to [facility] into hand saddle splint for improved joint approximation. NMR performed for improvements in motor control and coordination, balance, sequencing, judgement, and self confidence/ efficacy in performing all aspects of mobility at highest level of independence.   Patient seated upright in w/c at end of session with brakes locked, belt alarm set, and all needs within reach.   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), R hemi, avoid hypotension Restrictions Weight Bearing Restrictions: No  Pain: Pain Assessment Pain Scale: 0-10 Pain Score: 0-No pain related this session.   Therapy/Group: Individual Therapy  Loel Dubonnet PT, DPT, CSRS 10/11/2023, 12:47 PM

## 2023-10-11 NOTE — Plan of Care (Signed)
  Problem: Consults Goal: RH STROKE PATIENT EDUCATION Description: See Patient Education module for education specifics  Outcome: Progressing   Problem: RH BOWEL ELIMINATION Goal: RH STG MANAGE BOWEL WITH ASSISTANCE Description: STG Manage Bowel with toileting Assistance. Outcome: Progressing Goal: RH STG MANAGE BOWEL W/MEDICATION W/ASSISTANCE Description: STG Manage Bowel with Medication with mod I Assistance. Outcome: Progressing   Problem: RH BLADDER ELIMINATION Goal: RH STG MANAGE BLADDER WITH ASSISTANCE Description: STG Manage Bladder With toileting Assistance Outcome: Progressing Goal: RH STG MANAGE BLADDER WITH MEDICATION WITH ASSISTANCE Description: STG Manage Bladder With Medication With mod I Assistance. Outcome: Progressing   Problem: RH SAFETY Goal: RH STG ADHERE TO SAFETY PRECAUTIONS W/ASSISTANCE/DEVICE Description: STG Adhere to Safety Precautions With cues Assistance/Device. Outcome: Progressing   Problem: RH KNOWLEDGE DEFICIT Goal: RH STG INCREASE KNOWLEDGE OF DIABETES Description:  Patient and wife will be able to manage DM with medications and dietary modification using educational resources independently Outcome: Progressing Goal: RH STG INCREASE KNOWLEDGE OF HYPERTENSION Description: Patient and wife will be able to manage HTN with medications and dietary modification using educational resources independently Outcome: Progressing Goal: RH STG INCREASE KNOWLEGDE OF HYPERLIPIDEMIA Description: Patient and wife will be able to manage HLD with medications and dietary modification using educational resources independently Outcome: Progressing Goal: RH STG INCREASE KNOWLEDGE OF STROKE PROPHYLAXIS Description: Patient and wife will be able to manage Secondary stroke risks with medications and dietary modification using educational resources independently Outcome: Progressing

## 2023-10-11 NOTE — Patient Care Conference (Signed)
Inpatient RehabilitationTeam Conference and Plan of Care Update Date: 10/11/2023   Time: 10:17 AM    Patient Name: [name]      Medical Record Number: [id number]  Date of Birth: May 29, 1976 Sex: Male         Room/Bed: 4M10C/4M10C-01 Payor Info: Payor: Advertising copywriter MEDICARE / Plan: Suan Halter DUAL COMPLETE / Product Type: *No Product type* /    Admit Date/Time:  09/29/2023  5:22 PM  Primary Diagnosis:  Acute CVA (cerebrovascular accident) [facility])  Hospital Problems: Principal Problem:   Acute CVA (cerebrovascular accident) (HCC) Active Problems:   Ischemic cerebrovascular accident (CVA) due to global hypoperfusion with watershed infarction [facility])   Amnestic MCI (mild cognitive impairment with memory loss)    Expected Discharge Date: Expected Discharge Date: 10/19/23  Team Members Present: Physician leading conference: Dr. Claudette Laws Social Worker Present: Lavera Guise, BSW Nurse Present: Chana Bode, RN PT Present: Ralph Leyden, PT OT Present: Primitivo Gauze, OT SLP Present: Everardo Pacific, SLP PPS Coordinator present : Fae Pippin, SLP     Current Status/Progress Goal Weekly Team Focus  Bowel/Bladder   patient is continent to bowel and bladder LBM 10/15   maintain continence   assist with toileting needs    Swallow/Nutrition/ Hydration               ADL's   continues to need mod A UB self care and mod -max LB self care.  focusing on RUE NMR to enable pt to use hand and extremity in ADL tasks to increase independence.  improving grasp and release and now has active wrist extension.   min A bathing, LB dressing, toileting, toilet transfers, tub shower transfer and sit to stand with prosthetics.   ADL training, RUE NMR with estim, pt/family education    Mobility   Bed mobility = min/modA; Transfers = minA to stand, then maxA to maintain standing balance; Stand pivot = maxA; squat pivot to the R = CGA, to the L = modA;  Gait = MaxA  with HW   overall CGA with MinA for ambulation  Barriers: R hemi; impulsive (has gotten better), balance/// Focus: NMR R hemibody, R sided awareness and environmental scanning, standing balance, transfers, ambulation, w/c mobility    Communication   continues to present with neologisms and perseverations, though less than prior, increasing ability to name functional items   supervision-minA   communication of wants/needs (mainly verbally), increasing accuracy of word finding, reducing neologisms and perseverations    Safety/Cognition/ Behavioral Observations  min-modA during problem solving activities (counting money, abstract tasks) patient continues to require maxA to follow 2 step commands   supervision A   continue to address 2 step commands and functional problem solving    Pain   pt denies pain   pt to remain pain free   assess every shift and PRn    Skin   skin intact   skin to remain intact  maintain skin integrity      Discharge Planning:  Disharging home with spouse. Family plans to assist with supervison while spouse works. 2 level home, 4 steps. 1/2 bath on main.   Team Discussion: Patient post CVA with elevated Creat level; MD adjusted anti-rejection medications, encourage oral fluids for hydration.  Limited by fatigue and persevorations; needs cues for techniques with active movement in wrist and grasp; able to use for self care.    Patient on target to meet rehab goals: yes, currently needs set up for upper body care. Needs  CGA for transfers to the right and min assist for transfers to the left with min assist for sit - stand transfers.  Continues to work on problem solving, abstract tasks; and automatic speech is better. Goals for discharge set for min assist overall.  *See Care Plan and progress notes for long and short-term goals.   Revisions to Treatment Plan:  Repeated scan; Plavix restarted   Teaching Needs: Safety, medications, transfers, toileting,  dietary modification, etc.   Current Barriers to Discharge: None  Possible Resolutions to Barriers: Family education HH follow up services     Medical Summary Current Status: worsening renal status, fall which prompted CT/MRI head  Barriers to Discharge: Renal Insufficiency/Failure;Other (comments)  Barriers to Discharge Comments: s/p renal and pancreatic transplant Possible Resolutions to Barriers/Weekly Focus: Nephrology on consult, IVF for rehydration   Continued Need for Acute Rehabilitation Level of Care: The patient requires daily medical management by a physician with specialized training in physical medicine and rehabilitation for the following reasons: Direction of a multidisciplinary physical rehabilitation program to maximize functional independence : Yes Medical management of patient stability for increased activity during participation in an intensive rehabilitation regime.: Yes Analysis of laboratory values and/or radiology reports with any subsequent need for medication adjustment and/or medical intervention. : Yes   I attest that I was present, lead the team conference, and concur with the assessment and plan of the team.   Chana Bode B 10/11/2023, 3:37 PM

## 2023-10-11 NOTE — Progress Notes (Addendum)
PROGRESS NOTE   Subjective/Complaints:  Reviewed CT head left frontal 1cm bleed less conspicous, discussed with Neuro, ok to resume Plavix, informed pt   ROS: Limited by aphasia although able to communicate better, denies CP, SOB, abd pain, n/v/d/c.  Pertinent positives above.  Objective:   No results found. Recent Labs    10/08/23 0912 10/09/23 0530  WBC 7.7 7.3  HGB 12.5* 12.6*  HCT 39.9 40.4  PLT 495* 461*    Recent Labs    10/09/23 0530 10/11/23 0628  NA 139 136  K 4.2 4.6  CL 110 107  CO2 22 22  GLUCOSE 121* 130*  BUN 34* 33*  CREATININE 3.03* 2.74*  CALCIUM 8.9 8.9     Intake/Output Summary (Last 24 hours) at 10/11/2023 0737 Last data filed at 10/11/2023 0300 Gross per 24 hour  Intake 240 ml  Output 1100 ml  Net -860 ml        Physical Exam: Vital Signs Blood pressure (!) 163/78, pulse 73, temperature 97.9 F (36.6 C), resp. rate 17, height 5\' 5"  (1.651 m), weight 79.8 kg, SpO2 96%.   General: No acute distress, resting in bed.  Mood and affect are appropriate Heart: Regular rate and rhythm no rubs murmurs or extra sounds Lungs: Clear to auscultation, breathing unlabored, no rales or wheezes Abdomen: Positive bowel sounds, soft nontender to palpation, nondistended Extremities: No clubbing, cyanosis, or edema. B/L BKAs.  Skin: No evidence of breakdown, no evidence of rash  PRIOR EXAMS: + aphasia, fluent with neologisms , able to repeat, names 1/3 simple objects and with first letter cues could name 3/3, correctly + Apraxic and right inattention.  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 RLE 4/5 HF, 3- KE/KF LUE and LLE grossly antigravity and against resistance, 5 out of 5   Musculoskeletal: residual limbs well shaped.  Bilateral BKA   Assessment/Plan: 1. Functional deficits which require 3+ hours per day of interdisciplinary  therapy in a comprehensive inpatient rehab setting. Physiatrist is providing close team supervision and 24 hour management of active medical problems listed below. Physiatrist and rehab team continue to assess barriers to discharge/monitor patient progress toward functional and medical goals  Care Tool:  Bathing    Body parts bathed by patient: Right arm, Chest, Abdomen, Front perineal area, Buttocks, Right upper leg, Left upper leg, Face   Body parts bathed by helper: Left arm Body parts n/a: Left lower leg, Right lower leg   Bathing assist Assist Level: Maximal Assistance - Patient 24 - 49%     Upper Body Dressing/Undressing Upper body dressing   What is the patient wearing?: Pull over shirt    Upper body assist Assist Level: Moderate Assistance - Patient 50 - 74%    Lower Body Dressing/Undressing Lower body dressing      What is the patient wearing?: Pants, Underwear/pull up     Lower body assist Assist for lower body dressing: Maximal Assistance - Patient 25 - 49%     Toileting Toileting    Toileting assist Assist for toileting: Maximal Assistance - Patient 25 - 49%     Transfers Chair/bed  transfer  Transfers assist  Chair/bed transfer activity did not occur: Safety/medical concerns  Chair/bed transfer assist level: Moderate Assistance - Patient 50 - 74% (squat pivot)     Locomotion Ambulation   Ambulation assist   Ambulation activity did not occur: Safety/medical concerns          Walk 10 feet activity   Assist  Walk 10 feet activity did not occur: Safety/medical concerns        Walk 50 feet activity   Assist Walk 50 feet with 2 turns activity did not occur: Safety/medical concerns         Walk 150 feet activity   Assist Walk 150 feet activity did not occur: Safety/medical concerns         Walk 10 feet on uneven surface  activity   Assist Walk 10 feet on uneven surfaces activity did not occur: Safety/medical concerns          Wheelchair     Assist Is the patient using a wheelchair?: Yes Type of Wheelchair: Manual    Wheelchair assist level: Moderate Assistance - Patient 50 - 74% Max wheelchair distance: 150    Wheelchair 50 feet with 2 turns activity    Assist        Assist Level: Moderate Assistance - Patient 50 - 74%   Wheelchair 150 feet activity     Assist      Assist Level: Moderate Assistance - Patient 50 - 74%   Blood pressure (!) 163/78, pulse 73, temperature 97.9 F (36.6 C), resp. rate 17, height 5\' 5"  (1.651 m), weight 79.8 kg, SpO2 96%.  Medical Problem List and Plan: 1. 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  Antithrombotics: ASA 81mg  resumed per neuro recs , recheck CT head on 10/16 improved will resume plavix per Dr Roda Shutters, neuro recs -DVT/anticoagulation:  see above  -antiplatelet therapy: Aspirin and Plavix for three months followed by aspirin alone (started 9/26)- See above    3. Pain Management: Tylenol as needed   4. Mood/Behavior/Sleep: LCSW to evaluate and provide emotional support             -antipsychotic agents: n/a   -Melatonin 5mg  at bedtime  5. Neuropsych/cognition: This patient is not quite capable of making decisions on his own behalf.   -language improving. -may have capacity soon.   6. Skin/Wound Care: Routine skin care checks -Discussed with nursing good skin check of bilateral residual limbs once in bed at rehab   7.  Fluids/Electrolytes/Nutrition: strict Is and Os and follow-up chemistries   8: Hypertension: monitor TID and prn             -continue hydralazine 50 mg BID             -continue carvedilol 6.25 mg BID             -continue terazosin 4 mg q HS   -10/7: Increase Hydralazine to 50 mg TID -10/11 good  control , improving   -10/12-13/24 BPs looking good, cont regimen Vitals:   10/08/23 1738 10/09/23 0446 10/09/23 0855 10/09/23 1320  BP: (!) 164/77 (!) 143/70 (!) 140/71 (!) 145/68   10/09/23 1727 10/09/23 1928 10/10/23 0438 10/10/23 0902  BP: (!) 144/71 (!) 143/70 (!) 147/67 (!) 146/69   10/10/23 1339 10/10/23 1719 10/10/23 2046 10/11/23 0455  BP: 127/69 128/66 (!) 153/75 (!) 163/78  Mild sys elevation    9: Hyperlipidemia: continue rosuvastatin 20mg  daily   10: 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 Renal signed off -myfortic restarted per nephro 10/15             -Start IVF 10/9- recheck in am   -10/07/23 Cr 2.39 yesterday; monitor with routine labs Creat trending up will ask nephro to follow - appreciate neuro reconsult  13: GERD: continue protonix 40mg  BID   14: DM s/p pancreas transplant: CBGs QID; A1c = 5.8% (no home meds listed)             -discontinue SSI             -started carb modified diet- pt refuses now on reg diet (same as home)    - well controlled, continue current regimen 10/10  15: Nausea:  resolved -discontinue Reglan -continue PPI -Zofran prn   15: UTI: E. coli isolated: continue ceftriaxone 2 grams q 24 hours (day #4)             -completed abx--observe   16: Urinary retention: purewick in place             -10/6-continue PVRs thru today perhaps, in and out cath PRN   -pvr's all lesss than 160cc now, continent  -eob to void when possible  -terazosin now at 4 mg at bedtime               17: Hypokalemia: repleted; follow-up BMP -10-8 resolved with daily supplementation daily   18.  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

## 2023-10-11 NOTE — Progress Notes (Addendum)
Speech Language Pathology Daily Session Note  Patient Details  Name: [name] MRN: [id number] Date of Birth: 05-25-1976  Today's Date: 10/11/2023 SLP Individual Time: 0900-1000 SLP Individual Time Calculation (min): 60 min  Short Term Goals: Week 2: SLP Short Term Goal 1 (Week 2): Patient will demonstrate functional problem solving for basic and familair tasks with Min verbal and visual cues. SLP Short Term Goal 2 (Week 2): Patient will self-monitor and correct phonemic errors at the word level with Min verbal and visual cues. SLP Short Term Goal 3 (Week 2): Patient will follow 2-step commands with 50% accuracy and Mod verbal cues. SLP Short Term Goal 4 (Week 2): Patient will utilize multimodal communication to express basic wants/needs with Min A multimodal cues. SLP Short Term Goal 5 (Week 2): Patient will name functional items with 70% accuracy and Mod verbal cues.  Skilled Therapeutic Interventions: Skilled therapy session focused on problem solving, command following and automatic speech tasks. Patient utilized modA during problem solving activity as he categorized cards into number and color. During command following, patient able to follow one step functional commands independently, however required maxA during 2 step commands, specifically including touching body parts. Of note, OT reports increased ability to follow commands during functional tasks this date. To address communication, patient requested targeting automatic speech tasks including ABC's, months, days and important names. Patient required minA to name days of the week, and modA to name months and ABCs this week. During alphabet task, patient benefited from "singing" the alphabet and utilizing written cues. Patient left in chair with alarm set and call bell in reach. Continue POC.   Pain Pain Assessment Pain Score: 0-No pain  Therapy/Group: Individual Therapy  [name] M.A., CF-SLP 10/11/2023, 10:17  AM

## 2023-10-11 NOTE — Progress Notes (Signed)
Occupational Therapy Session Note  Patient Details  Name: [name] MRN: [id number] Date of Birth: Jun 04, 1976  Today's Date: 10/11/2023 OT Individual Time: 0830-0900 OT Individual Time Calculation (min): 30 min    Short Term Goals: Week 1:  OT Short Term Goal 1 (Week 1): Patient will complete toilet transfer with mod A of 1 OT Short Term Goal 1 - Progress (Week 1): Met OT Short Term Goal 2 (Week 1): Patient will recall hemi-dressing techniques with min cues OT Short Term Goal 2 - Progress (Week 1): Progressing toward goal OT Short Term Goal 3 (Week 1): Patient will complete 1 step of LB dressing task OT Short Term Goal 3 - Progress (Week 1): Met Week 2:  OT Short Term Goal 1 (Week 2): Pt will don a t shirt with min A demonstrating improved motor planning and R side awareness. OT Short Term Goal 2 (Week 2): Pt will complete squat pivot transfers to toilet and or BSC with min A. OT Short Term Goal 3 (Week 2): Pt will be able to use R hand to grasp wash cloth while helper assists moving arm to wash his L arm  Skilled Therapeutic Interventions/Progress Updates:    Pt received in bed ready for therapy.  Focus of therapy session on hemidressing strategies, balance and use of R hand as a stabilizing A. Pt agreeable to dressing and did not feel he needed a bath as he bathed yesterday.  Pt donned LB clothing form bed level with mod A, sat to EOB with CGA.  From sitting, donned shirt with set up A to open shirt sleeve for him to place R hand in and then pt able to continue donning shirt.   Pt practiced sit to partial squat 4x with min A and then completed sit pivot transfer to his L   with min A.   Pt set up at sink to allow him to don dentures.   SLP arrived for the next session.       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), R hemi, avoid hypotension Restrictions Weight Bearing  Restrictions: No   Pain: Pain Assessment Pain Score: 0-No pain ADL: ADL Eating: Set up Grooming: Setup Upper Body Bathing: Minimal assistance Lower Body Bathing: Minimal assistance Where Assessed-Lower Body Bathing: Bed level Upper Body Dressing: Setup Where Assessed-Upper Body Dressing: Edge of bed Lower Body Dressing: Moderate assistance Where Assessed-Lower Body Dressing: Bed level Toileting: Maximal assistance Toilet Transfer: Maximal assistance   Therapy/Group: Individual Therapy  Timberlee Roblero 10/11/2023, 10:04 AM

## 2023-10-12 DIAGNOSIS — Z89512 Acquired absence of left leg below knee: Secondary | ICD-10-CM | POA: Diagnosis not present

## 2023-10-12 DIAGNOSIS — I10 Essential (primary) hypertension: Secondary | ICD-10-CM | POA: Diagnosis not present

## 2023-10-12 DIAGNOSIS — N1832 Chronic kidney disease, stage 3b: Secondary | ICD-10-CM | POA: Diagnosis not present

## 2023-10-12 DIAGNOSIS — I639 Cerebral infarction, unspecified: Secondary | ICD-10-CM | POA: Diagnosis not present

## 2023-10-12 LAB — RENAL FUNCTION PANEL
Albumin: 2.5 g/dL — ABNORMAL LOW (ref 3.5–5.0)
Anion gap: 9 (ref 5–15)
BUN: 34 mg/dL — ABNORMAL HIGH (ref 6–20)
CO2: 19 mmol/L — ABNORMAL LOW (ref 22–32)
Calcium: 8.6 mg/dL — ABNORMAL LOW (ref 8.9–10.3)
Chloride: 107 mmol/L (ref 98–111)
Creatinine, Ser: 2.27 mg/dL — ABNORMAL HIGH (ref 0.61–1.24)
GFR, Estimated: 35 mL/min — ABNORMAL LOW (ref >60)
Glucose, Bld: 125 mg/dL — ABNORMAL HIGH (ref 70–99)
Phosphorus: 3.9 mg/dL (ref 2.5–4.6)
Potassium: 4.7 mmol/L (ref 3.5–5.1)
Sodium: 135 mmol/L (ref 135–145)

## 2023-10-12 NOTE — Progress Notes (Signed)
Occupational Therapy Weekly Progress Note  Patient Details  Name: [name] MRN: [id number] Date of Birth: [birth year]  Beginning of progress report period: October 06, 2023 - October 12, 2023  Today's Date: 10/12/2023 OT Individual Time: 7829-5621 OT Individual Time Calculation (min): 75 min    Patient has met 3 of 3 short term goals.  Pt is making progress with R hand and now has fair grasp strength with active wrist extension. Trace movement in elbow and beginning shoulder elevation and retraction.   Patient continues to demonstrate the following deficits: abnormal tone and unbalanced muscle activation, decreased attention to right, and decreased sitting balance, decreased standing balance, and hemiplegia and therefore will continue to benefit from skilled OT intervention to enhance overall performance with BADL.  Patient progressing toward long term goals..  Plan of care revisions: .[name] A few revisions of LTGs were made: Problem: RH Balance Goal: LTG Patient will maintain dynamic standing with ADLs (OT) Description: LTG:  Patient will maintain dynamic standing balance with assist during activities of daily living (OT)  Flowsheets (Taken 10/12/2023 1216) LTG: Pt will maintain dynamic standing balance during ADLs with: (with UE support on RW or sink) Minimal Assistance - Patient > 75% Note: LTG down graded due to R hemiparesis   Problem: RH Dressing Goal: LTG Patient will perform lower body dressing w/assist (OT) Description: LTG: Patient will perform lower body dressing with assist, with/without cues in positioning using equipment (OT) Flowsheets (Taken 10/12/2023 1216) LTG: Pt will perform lower body dressing with assistance level of: (LTG downgraded due to pt having difficulty managing prosthetics and buttons/belt fasteners with R hemiparesis.) Moderate Assistance - Patient 50 - 74% Note: LTG downgraded due to pt having difficulty managing prosthetics and buttons/belt  fasteners with R hemiparesis.    Problem: RH Toileting Goal: LTG Patient will perform toileting task (3/3 steps) with assistance level (OT) Description: LTG: Patient will perform toileting task (3/3 steps) with assistance level (OT)  Flowsheets (Taken 10/12/2023 1216) LTG: Pt will perform toileting task (3/3 steps) with assistance level: (LTG downgraded due to pt having difficulty managing  buttons/belt fasteners with R hemiparesis. pt will need more A to manage clothing over hips due to style of clothing he prefers to wear.) Moderate Assistance - Patient 50 - 74% Note: LTG downgraded due to pt having difficulty managing buttons/belt fasteners with R hemiparesis. pt will need more A to manage clothing over hips due to style of clothing he prefers to wear.    Problem: RH Functional Use of Upper Extremity Goal: LTG Patient will use RT/LT upper extremity as a (OT) Description: LTG: Patient will use right/left upper extremity as a stabilizer/gross assist/diminished/nondominant/dominant level with assist, with/without cues during functional activity (OT) Flowsheets (Taken 10/12/2023 1216) LTG: Use of upper extremity in functional activities: RUE as gross assist level LTG: Pt will use upper extremity in functional activity with assistance level of: Supervision/Verbal   OT Short Term Goals Week 1:  OT Short Term Goal 1 (Week 1): Patient will complete toilet transfer with mod A of 1 OT Short Term Goal 1 - Progress (Week 1): Met OT Short Term Goal 2 (Week 1): Patient will recall hemi-dressing techniques with min cues OT Short Term Goal 2 - Progress (Week 1): Progressing toward goal OT Short Term Goal 3 (Week 1): Patient will complete 1 step of LB dressing task OT Short Term Goal 3 - Progress (Week 1): Met Week 2:  OT Short Term Goal 1 (Week 2): Pt will don a  t shirt with min A demonstrating improved motor planning and R side awareness. OT Short Term Goal 1 - Progress (Week 2): Met OT Short Term Goal  2 (Week 2): Pt will complete squat pivot transfers to toilet and or BSC with min A. OT Short Term Goal 2 - Progress (Week 2): Met OT Short Term Goal 3 (Week 2): Pt will be able to use R hand to grasp wash cloth while helper assists moving arm to wash his L arm OT Short Term Goal 3 - Progress (Week 2): Met Week 3:  OT Short Term Goal 1 (Week 3): STGs = LTGs  Skilled Therapeutic Interventions/Progress Updates:    Pt received in bed and agreeable to a shower. RN disconnected IV and OT covered it with waterproof barrier.  Pt sat to EOB and donned prosthetics with mod A.  Completed squat pivot transfer to R to w/c with min A, then to tub bench to L with bars with CGA.  Once on bench doffed prosthetics. Pt showered, dried off and redonned prosthetics.  See ADL documentation below.  Returned to wc with min A to dress.   Practiced standing at sink. With L hand on sink pt able to stand with light CGA to supervision but once L hand removed, as pt tried to pull his clothing up he needed more of a mod A with dynamic balance.   Pt applied lotion to arms.   Throughout ADLS therapist facilitated R UE with hand over hand to allow him to actively use his grasp as therapist helped to guide his arm.  Pt actively engaged and motivated and demonstrated improved processing skills and verbal expression.  At end of session, pt worked on holding a 1 lb bar in B hands and worked on elbow flexion for [employer] for R with L leading.  He maintained grasp well on bar.  Tried to have pt push his arms out straight but too difficult for him to achieve.  Resting in w/c with all needs met and alarm set.   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), R hemi, avoid hypotension Restrictions Weight Bearing Restrictions: No      Pain: Pain Assessment Pain Scale: 0-10 Pain Score: 0-No pain ADL: ADL Eating: Set up Grooming: Setup Upper Body  Bathing: Minimal assistance Where Assessed-Upper Body Bathing: Shower Lower Body Bathing: Contact guard Where Assessed-Lower Body Bathing: Shower Upper Body Dressing: Contact guard Where Assessed-Upper Body Dressing: Wheelchair Lower Body Dressing: Moderate assistance Where Assessed-Lower Body Dressing: Wheelchair Toileting: Maximal assistance Toilet Transfer: Minimal Web designer: Psychiatric nurse: Insurance underwriter Method: Administrator: Emergency planning/management officer, Grab bars   Therapy/Group: Individual Therapy  Sadeel Fiddler 10/12/2023, 12:23 PM

## 2023-10-12 NOTE — Plan of Care (Signed)
  Problem: RH Balance Goal: LTG Patient will maintain dynamic standing with ADLs (OT) Description: LTG:  Patient will maintain dynamic standing balance with assist during activities of daily living (OT)  Flowsheets (Taken 10/12/2023 1216) LTG: Pt will maintain dynamic standing balance during ADLs with: (with UE support on RW or sink) Minimal Assistance - Patient > 75% Note: LTG down graded due to R hemiparesis   Problem: RH Dressing Goal: LTG Patient will perform lower body dressing w/assist (OT) Description: LTG: Patient will perform lower body dressing with assist, with/without cues in positioning using equipment (OT) Flowsheets (Taken 10/12/2023 1216) LTG: Pt will perform lower body dressing with assistance level of: (LTG downgraded due to pt having difficulty managing prosthetics and buttons/belt fasteners with R hemiparesis.) Moderate Assistance - Patient 50 - 74% Note: LTG downgraded due to pt having difficulty managing prosthetics and buttons/belt fasteners with R hemiparesis.    Problem: RH Toileting Goal: LTG Patient will perform toileting task (3/3 steps) with assistance level (OT) Description: LTG: Patient will perform toileting task (3/3 steps) with assistance level (OT)  Flowsheets (Taken 10/12/2023 1216) LTG: Pt will perform toileting task (3/3 steps) with assistance level: (LTG downgraded due to pt having difficulty managing  buttons/belt fasteners with R hemiparesis. pt will need more A to manage clothing over hips due to style of clothing he prefers to wear.) Moderate Assistance - Patient 50 - 74% Note: LTG downgraded due to pt having difficulty managing buttons/belt fasteners with R hemiparesis. pt will need more A to manage clothing over hips due to style of clothing he prefers to wear.    Problem: RH Functional Use of Upper Extremity Goal: LTG Patient will use RT/LT upper extremity as a (OT) Description: LTG: Patient will use right/left upper extremity as a stabilizer/gross  assist/diminished/nondominant/dominant level with assist, with/without cues during functional activity (OT) Flowsheets (Taken 10/12/2023 1216) LTG: Use of upper extremity in functional activities: RUE as gross assist level LTG: Pt will use upper extremity in functional activity with assistance level of: Supervision/Verbal cueing

## 2023-10-12 NOTE — Progress Notes (Addendum)
PROGRESS NOTE   Subjective/Complaints:  Pt awake and alert working with SLP  Had 1 L IVFx 1, creat dropped by .5  ROS: Limited by aphasia although able to communicate better, denies CP, SOB, abd pain, n/v/d/c.  Pertinent positives above.  Objective:   CT HEAD WO CONTRAST ( )  Result Date: 10/11/2023 CLINICAL DATA:  Stroke, hemorrhagic EXAM: CT HEAD WITHOUT CONTRAST TECHNIQUE: Contiguous axial images were obtained from the base of the skull through the vertex without intravenous contrast. RADIATION DOSE REDUCTION: This exam was performed according to the departmental dose-optimization program which includes automated exposure control, adjustment of the mA and/or kV according to patient size and/or use of iterative reconstruction technique. COMPARISON:  Brain MR 10/08/23, CT head 10/08/23 FINDINGS: Brain: Evolving left MCA territory infarcts involving the left frontal and parietal lobes, better seen on recent prior brain MRI. Redemonstrated petechial hemorrhage along the posterior left frontal lobe infarct (series 3, image 30). No hydrocephalus. No extra-axial fluid collection. No CT evidence of a new cortical infarct. Mass effect. No mass lesion. Vascular: No hyperdense vessel or unexpected calcification. Skull: Normal. Negative for fracture or focal lesion. Sinuses/Orbits: Trace right mastoid effusion. Trace mucosal thickening in the bilateral maxillary sinuses. Bilateral lens replacement. Orbits are otherwise unremarkable. Other: None. IMPRESSION: Evolving left MCA territory infarcts involving the left frontal and parietal lobes, better seen on recent prior brain MRI. Redemonstrated petechial hemorrhage along the posterior left frontal lobe. Electronically Signed   By: Lorenza Cambridge M.D.   On: 10/11/2023 09:56   No results for input(s): "WBC", "HGB", "HCT", "PLT" in the last 72 hours.   Recent Labs    10/11/23 0628 10/12/23 0641  NA  136 135  K 4.6 4.7  CL 107 107  CO2 22 19*  GLUCOSE 130* 125*  BUN 33* 34*  CREATININE 2.74* 2.27*  CALCIUM 8.9 8.6*     Intake/Output Summary (Last 24 hours) at 10/12/2023 0745 Last data filed at 10/11/2023 1841 Gross per 24 hour  Intake 940 ml  Output 700 ml  Net 240 ml        Physical Exam: Vital Signs Blood pressure (!) 171/81, pulse 70, temperature 97.7 F (36.5 C), temperature source Oral, resp. rate 18, height 5\' 5"  (1.651 m), weight 79.8 kg, SpO2 99%.   General: No acute distress, resting in bed.  Mood and affect are appropriate Heart: Regular rate and rhythm no rubs murmurs or extra sounds Lungs: Clear to auscultation, breathing unlabored, no rales or wheezes Abdomen: Positive bowel sounds, soft nontender to palpation, nondistended Extremities: No clubbing, cyanosis, or edema. B/L BKAs.  Skin: No evidence of breakdown, no evidence of rash  PRIOR EXAMS: + aphasia, comprehension improving, can remember d/c date  + Apraxic and right inattention.  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 RLE 4/5 HF, 3- KE/KF LUE and LLE grossly antigravity and against resistance, 5 out of 5   Musculoskeletal: residual limbs well shaped.  Bilateral BKA   Assessment/Plan: 1. Functional deficits which require 3+ hours per day of interdisciplinary therapy in a comprehensive inpatient rehab setting. Physiatrist is providing close team supervision and 24  hour management of active medical problems listed below. Physiatrist and rehab team continue to assess barriers to discharge/monitor patient progress toward functional and medical goals  Care Tool:  Bathing    Body parts bathed by patient: Right arm, Chest, Abdomen, Front perineal area, Buttocks, Right upper leg, Left upper leg, Face   Body parts bathed by helper: Left arm Body parts n/a: Left lower leg, Right lower leg   Bathing assist Assist Level: Maximal Assistance -  Patient 24 - 49%     Upper Body Dressing/Undressing Upper body dressing   What is the patient wearing?: Pull over shirt    Upper body assist Assist Level: Moderate Assistance - Patient 50 - 74%    Lower Body Dressing/Undressing Lower body dressing      What is the patient wearing?: Pants, Underwear/pull up     Lower body assist Assist for lower body dressing: Maximal Assistance - Patient 25 - 49%     Toileting Toileting    Toileting assist Assist for toileting: Maximal Assistance - Patient 25 - 49%     Transfers Chair/bed transfer  Transfers assist  Chair/bed transfer activity did not occur: Safety/medical concerns  Chair/bed transfer assist level: Moderate Assistance - Patient 50 - 74% (squat pivot)     Locomotion Ambulation   Ambulation assist   Ambulation activity did not occur: Safety/medical concerns          Walk 10 feet activity   Assist  Walk 10 feet activity did not occur: Safety/medical concerns        Walk 50 feet activity   Assist Walk 50 feet with 2 turns activity did not occur: Safety/medical concerns         Walk 150 feet activity   Assist Walk 150 feet activity did not occur: Safety/medical concerns         Walk 10 feet on uneven surface  activity   Assist Walk 10 feet on uneven surfaces activity did not occur: Safety/medical concerns         Wheelchair     Assist Is the patient using a wheelchair?: Yes Type of Wheelchair: Manual    Wheelchair assist level: Moderate Assistance - Patient 50 - 74% Max wheelchair distance: 150    Wheelchair 50 feet with 2 turns activity    Assist        Assist Level: Moderate Assistance - Patient 50 - 74%   Wheelchair 150 feet activity     Assist      Assist Level: Moderate Assistance - Patient 50 - 74%   Blood pressure (!) 171/81, pulse 70, temperature 97.7 F (36.5 C), temperature source Oral, resp. rate 18, height 5\' 5"  (1.651 m), weight 79.8 kg,  SpO2 99%.  Medical Problem List and Plan: 1. 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  Antithrombotics: ASA 81mg  resumed per neuro recs , recheck CT head on 10/16 improved will resume plavix per Dr Roda Shutters, neuro recs -DVT/anticoagulation:  see above  -antiplatelet therapy: Aspirin and Plavix for three months followed by aspirin alone (started 9/26)- See above    3. Pain Management: Tylenol as needed   4. Mood/Behavior/Sleep: LCSW to evaluate and provide emotional support             -antipsychotic agents: n/a   -Melatonin 5mg  at bedtime  5. Neuropsych/cognition: This patient is not quite capable of making decisions on his own behalf.   -language improving. -may have capacity soon.   6. Skin/Wound Care: Routine skin care checks -Discussed with nursing good skin check of bilateral residual limbs once in bed at rehab   7. Fluids/Electrolytes/Nutrition: strict Is and Os and follow-up chemistries   8: Hypertension: monitor TID and prn             -continue hydralazine 50 mg BID             -continue carvedilol 6.25 mg BID             -continue terazosin 4 mg q HS   -10/7: Increase Hydralazine to 50 mg TID -10/11 good  control , improving   -10/12-13/24 BPs looking good, cont regimen Vitals:   10/09/23 1320 10/09/23 1727 10/09/23 1928 10/10/23 0438  BP: (!) 145/68 (!) 144/71 (!) 143/70 (!) 147/67   10/10/23 0902 10/10/23 1339 10/10/23 1719 10/10/23 2046  BP: (!) 146/69 127/69 128/66 (!) 153/75    10/11/23 0455 10/11/23 1346 10/11/23 2041 10/12/23 0620  BP: (!) 163/78 139/68 (!) 153/75 (!) 171/81  Systolic elevation  - likely with IVF , d/c IVF and observe    9: Hyperlipidemia: continue rosuvastatin 20mg  daily   10: 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 Renal signed off -myfortic restarted per nephro 10/15              Creat trending up will ask nephro to follow - appreciate neuro reconsult  13: GERD: continue protonix 40mg  BID   14: DM s/p pancreas transplant: CBGs QID; A1c = 5.8% (no home meds listed)             -discontinue SSI             -started carb modified diet- pt refuses now on reg diet (same as home)    - well controlled, continue current regimen 10/10  15: Nausea: resolved -discontinue Reglan -continue PPI -Zofran prn   15: UTI: E. coli              -completed abx--observe   16: Urinary retention: Resolved             -10/6-continue PVRs thru today perhaps, in and out cath PRN   -pvr's all lesss than 160cc now, continent  -eob to void when possible  -  terazosin now at 4 mg at bedtime               17: Hypokalemia: repleted; follow-up BMP -10-8 resolved with daily supplementation daily   18.  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

## 2023-10-12 NOTE — Progress Notes (Addendum)
Patient ID: [name], male   DOB: 1976-07-11, 47 y.o.   MRN: [id number]  Patient approved by [facility].  Orders sent. No DME recommendations currently.

## 2023-10-12 NOTE — Progress Notes (Signed)
[facility] Progress Note   Assessment/ Plan:   # AKI  - may be hemodynamic/ischemic ATN in the setting of severe allergic reaction requiring intubation as well as pre-renal insults from gram negative bacteremia.  Transplant ultrasound without hydro - Baseline Cr per the [facility] system appears to be in the high 1's.  Creatinine during this hospitalization has been around 2.2-2.5 most recently. -Creatinine back to baseline with IV fluids -Encourage oral hydration - Continue supportive care  - bladder scans are ordered BID   Given the patient's creatinine is back to baseline we will sign off at this time.   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  Increased cyclosporine to 100 mg BID.  Per his transplant center goal cyclosporine level is 50-125.   - cyclosporine trough from 10/4 am is at goal   # Pancreas transplant - note hyperglycemia  - amylase and lipase ok  - glucose management per primary team    # E coli bacteremia - per 09/23/23 blood cultures  - abx per primary team  - stopped myfortic as above given bacteremia  - repeat blood cultures from 10/4 are no growth to date -Safe to restart Myfortic   # HTN  -Blood pressure slightly elevated.  Continue to monitor for now   # Urinary retention - s/p in/out cath -Continue to monitor PVRs - terazosin 4 mg  daily   # Allergic reaction - to belatacept  - note his regimen has been adjusted (stopped bela, transitioned to cyclosporine)    Subjective:    Patient feels well today.  Working with therapy. No complaints   Objective:   BP (!) 171/81 (BP Location: Left Arm)   Pulse 70   Temp 97.7 F (36.5 C) (Oral)   Resp 18   Ht 5\' 5"  (1.651 m)   Wt 79.8 kg   SpO2 99%   BMI 29.28 kg/m   Physical Exam: Gen:NAD, sitting in wheelchair CVS: Normal rate, no rub Resp: Bilateral chest rise with no increased work of breathing Abd: soft Ext: no LE edema s/p bilateral BKA  Labs: BMET Recent Labs  Lab 10/09/23 0530 10/11/23 0628 10/12/23 0641  NA 139 136 135  K 4.2 4.6 4.7  CL 110 107 107  CO2 22 22 19*  GLUCOSE 121* 130* 125*  BUN 34* 33* 34*  CREATININE 3.03* 2.74* 2.27*  CALCIUM 8.9 8.9 8.6*  PHOS  --  3.9 3.9   CBC Recent Labs  Lab 10/08/23 0912 10/09/23 0530  WBC 7.7 7.3  HGB 12.5* 12.6*  HCT 39.9 40.4  MCV 97.6 97.1  PLT 495* 461*      Medications:     aspirin EC  81 mg Oral Daily   carvedilol  6.25 mg Oral BID WC   clopidogrel  75 mg Oral Daily   cycloSPORINE modified  100 mg Oral BID   heparin injection (subcutaneous)  5,000 Units Subcutaneous Q8H   hydrALAZINE  50 mg Oral BID   melatonin  5 mg Oral QHS   mycophenolate  180 mg Oral BID   pantoprazole  40 mg Oral BID   potassium chloride  10 mEq Oral Daily   predniSONE  5 mg Oral Daily   rosuvastatin  20 mg Oral Daily   senna-docusate  1 tablet Oral QHS   sodium chloride flush  3 mL Intravenous Q12H   sodium chloride flush  3 mL Intravenous Q12H   sulfamethoxazole-trimethoprim  1 tablet Oral Q M,W,F   terazosin  4 mg Oral QHS

## 2023-10-12 NOTE — Progress Notes (Signed)
Physical Therapy Session Note  Patient Details  Name: [name] MRN: [id number] Date of Birth: May 14, 1976  Today's Date: 10/12/2023 PT Individual Time: 1330-1440 PT Individual Time Calculation (min): 70 min   Short Term Goals: Week 2:  PT Short Term Goal 1 (Week 2): Pt will complete sit<>stand to standing balance with Mod A +1 PT Short Term Goal 2 (Week 2): Pt will complete squat pivot with overall MinA. PT Short Term Goal 3 (Week 2): Pt will complete bed mobility with MinA. PT Short Term Goal 4 (Week 2): Pt will complete WC mobility with supervision more than 150 ft. PT Short Term Goal 5 (Week 2): Pt will ambulate at least 100 ft using HW with ModA.  Skilled Therapeutic Interventions/Progress Updates:      Pt sitting in w/c to start - agreeable to therapy treatment. No reports of pain. Assisted with donning B prothesis with assist for R only.   Pt transported to main rehab hallway. Instructed in gait training using the HW. He needs minA for rising to stand with HW on his L side. Pt ambulated 58ft + 66ft with modA of 1 person with +2 assist for w/c follow for safety. Pt needing assist for trunk stabilization due to R trunk lean and assist for advancing RLE in swing.   Blocked practice squat pivot transfers with CGA to the L side and minA to the R side. Pt with decreased efficiency and needing multiple scoots to achieve transfer. He also requires the wheelchair to be stabilized as it can slide easily on him when he push's off of it to transfer. (More lateral scoot transfers with limited buttock clearance).   Completed 1x5 repeated sit<>stands with no AD, using LUE to push from mat table with CGA for safety. Limited weight bearing through RLE with over compensation on his L leg.  Pt instructed in Standing balance with ball taps to rehab tech with minA for balance - encouraged ball taps on his R side to promote R weight shifting. Pt frequently falling backwards due to insufficient  balance strategies.   W/c mobility from ortho rehab gym to ADL apartment - supervision overall using hemi technique. He does have difficulty navigating doorways, and tends to run into the R side of the door frame.  Furniture transfers (to sofa couch) via squat pivot with minA for both directions. Pt reports he typically spends his time on the couch at home.  Stair training initiated using 3" steps and 1 hand rail on his L. He navigated up/down a total of x8 steps with minA for ascent and modA for descent. Step-to pattern while forward facing. Increased difficulty bringing RLE during descent > ascent.   Returned to his room and pt left sitting in wheelchair with safety belt alarm on and call bell in reach. All needs met.    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

## 2023-10-12 NOTE — Progress Notes (Signed)
Speech Language Pathology Daily Session Note  Patient Details  Name: [name] MRN: [id number] Date of Birth: [birth year]  Today's Date: 10/12/2023 SLP Individual Time: 1100-1158 SLP Individual Time Calculation (min): 58 min  Short Term Goals: Week 2: SLP Short Term Goal 1 (Week 2): Patient will demonstrate functional problem solving for basic and familair tasks with Min verbal and visual cues. SLP Short Term Goal 2 (Week 2): Patient will self-monitor and correct phonemic errors at the word level with Min verbal and visual cues. SLP Short Term Goal 3 (Week 2): Patient will follow 2-step commands with 50% accuracy and Mod verbal cues. SLP Short Term Goal 4 (Week 2): Patient will utilize multimodal communication to express basic wants/needs with Min A multimodal cues. SLP Short Term Goal 5 (Week 2): Patient will name functional items with 70% accuracy and Mod verbal cues.  Skilled Therapeutic Interventions: Skilled therapy session focused on cognitive and communicative goals. Patient with significant progress this session during word finding task with ability to name functional items with 100% accuracy given minA. To further target communication, SLP addressed automatic speech tasks and family names. Patient able to recall names when given written cue, however continues to perseverate on prior vocabulary. Of note, patient aware of mistakes this session and often self corrected. To address cognition, SLP provided minA to aid patient in placing number and letter cards in order. This activity targeted problem solving and attention goals. Patient left in [facility] with alarm set and call bell in reach. Continue POC.   Pain Pain Assessment Pain Scale: 0-10 Pain Score: 0-No pain  Therapy/Group: Individual Therapy  Ruari Mudgett M.A., CF-SLP 10/12/2023, 12:19 PM

## 2023-10-13 LAB — RENAL FUNCTION PANEL
Albumin: 2.7 g/dL — ABNORMAL LOW (ref 3.5–5.0)
Anion gap: 12 (ref 5–15)
BUN: 36 mg/dL — ABNORMAL HIGH (ref 6–20)
CO2: 14 mmol/L — ABNORMAL LOW (ref 22–32)
Calcium: 8.6 mg/dL — ABNORMAL LOW (ref 8.9–10.3)
Chloride: 111 mmol/L (ref 98–111)
Creatinine, Ser: 2.3 mg/dL — ABNORMAL HIGH (ref 0.61–1.24)
GFR, Estimated: 34 mL/min — ABNORMAL LOW (ref >60)
Glucose, Bld: 140 mg/dL — ABNORMAL HIGH (ref 70–99)
Phosphorus: 3.9 mg/dL (ref 2.5–4.6)
Potassium: 5.9 mmol/L — ABNORMAL HIGH (ref 3.5–5.1)
Sodium: 137 mmol/L (ref 135–145)

## 2023-10-13 LAB — CYCLOSPORINE: Cyclosporine, LabCorp: 71 ng/mL — ABNORMAL LOW (ref 100–400)

## 2023-10-13 NOTE — Progress Notes (Signed)
Speech Language Pathology Weekly Progress and Session Note  Patient Details  Name: [name] MRN: [id number] Date of Birth: 28-Dec-1975  Beginning of progress report period: October 06, 2023 End of progress report period: October 13, 2023  Today's Date: 10/13/2023 SLP Individual Time: 4098-1191 SLP Individual Time Calculation (min): 57 min  Short Term Goals: Week 2: SLP Short Term Goal 1 (Week 2): Patient will demonstrate functional problem solving for basic and familair tasks with Min verbal and visual cues. SLP Short Term Goal 1 - Progress (Week 2): Met SLP Short Term Goal 2 (Week 2): Patient will self-monitor and correct phonemic errors at the word level with Min verbal and visual cues. SLP Short Term Goal 2 - Progress (Week 2): Met SLP Short Term Goal 3 (Week 2): Patient will follow 2-step commands with 50% accuracy and Mod verbal cues. SLP Short Term Goal 3 - Progress (Week 2): Met SLP Short Term Goal 4 (Week 2): Patient will utilize multimodal communication to express basic wants/needs with Min A multimodal cues. SLP Short Term Goal 4 - Progress (Week 2): Met SLP Short Term Goal 5 (Week 2): Patient will name functional items with 70% accuracy and Mod verbal cues. SLP Short Term Goal 5 - Progress (Week 2): Met    New Short Term Goals: Week 3: SLP Short Term Goal 1 (Week 3): Patient will demonstrate functional problem solving for basic and familair tasks with supervision verbal and visual cues. SLP Short Term Goal 2 (Week 3): Patient will self-monitor and correct phonemic errors at the word level with supervision verbal and visual cues. SLP Short Term Goal 3 (Week 3): Patient will follow 2-step commands with 70% accuracy and Min verbal cues. SLP Short Term Goal 4 (Week 3): Patient will utilize multimodal communication to express basic wants/needs with supervision A multimodal cues. SLP Short Term Goal 5 (Week 3): Patient will name functional items with 80% accuracy and Min  verbal cues.  Weekly Progress Updates: Pt has made excellent gains and has met 5 of 5 STGs this reporting period due to improved communication and cognition. Currently, patient continues to require min A for awareness of errors, answer basic problem solving questions, and use of multimodal communication to express wants/needs. Patient continues to benefit from modA to follow 2 step directions and name functional items. Of note, patient with significant in functional naming tasks in yesterdays session. Pt/family eduction ongoing. Pt would benefit from continued ST intervention to maximize communication and cognition in order to maximize functional independence at d/c.    Intensity: Minumum of 1-2 x/day, 30 to 90 minutes Frequency: 3 to 5 out of 7 days Duration/Length of Stay: 10/30 Treatment/Interventions: Cognitive remediation/compensation;Cueing hierarchy;Environmental controls;Functional tasks;Internal/external aids;Multimodal communication approach;Patient/family education;Therapeutic Activities   Daily Session  Skilled Therapeutic Interventions:  Skilled therapy session focused on cognitive goals. SLP faciliated session by providing minA during abstract and concrete categorization tasks. Patient was instructed to place items into a column according to their category, then name each category at the end of the activity. Upon completion, patient utilized [employer] to follow two step directions. Patient required modA during this activity for reminders to look at the correct day of week and time of day. Patient increasingly aware of communictative mistakes during conversation with attempts to repair independently. Patient left in bed with call bell in reach and alarm set. Continue POC.      Pain No reports of pain  Therapy/Group: Individual Therapy  Luree Palla M.A., CF-SLP 10/13/2023, 7:54 AM

## 2023-10-13 NOTE — Progress Notes (Signed)
Occupational Therapy Session Note  Patient Details  Name: [name] MRN: [id number] Date of Birth: 09-10-1976  Today's Date: 10/13/2023 OT Individual Time: 1009-1105 OT Individual Time Calculation (min): 56 min    Short Term Goals: Week 3:  OT Short Term Goal 1 (Week 3): STGs = LTGs  Skilled Therapeutic Interventions/Progress Updates:  Pt greeted supine in bed, pt agreeable to OT intervention.      Transfers/bed mobility: pt completed supine>sit with CGA to L side of bed. Pt completed squat pivot from EOB>w/c to R side with CGA. Pt completed sit>stands during dressing with MOD A with RW.    ADLs:  UB dressing: pt donned [place] shirt from EOB MINA, good recall of hemi techniques  LB dressing: pt donned pants from EOB with MAX A, pt stood with RW with MODA while therapist pulled pants to waist line.  Footwear: pt donned bilateral prosthetics with CGA- set- up from EOB  Of note pt did verbalize, " put these in the drawer" in regards to his dirty clothes    NMR:  1:1 NMES applied to R bicep at the below settings:    Ratio 1:3 Rate 35 pps Waveform- Asymmetric Ramp 1.0 Pulse 300 Intensity- 20  Duration -   10 mins  Worked on squeezing wash cloth and bring wash cloth to face to simulate ADL tasks.    Ended session with pt seated in w/c with all needs within reach and safety belt alarm activated.                    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

## 2023-10-13 NOTE — Plan of Care (Signed)
CHL Tonsillectomy/Adenoidectomy, Postoperative PEDS care plan entered in error.

## 2023-10-13 NOTE — Progress Notes (Signed)
RN has discontinued telesitter as patient has not displayed any impulsiveness in past 24hours.

## 2023-10-13 NOTE — Progress Notes (Signed)
Physical Therapy Session Note  Patient Details  Name: [name] MRN: [id number] Date of Birth: [birth year]  Today's Date: 10/13/2023 PT Individual Time: 1330-1415 PT Individual Time Calculation (min): 45 min  And  Today's Date: 10/13/2023 PT Missed Time: 30 Minutes Missed Time Reason: Other (Comment) (requesting to complete lunch)  Short Term Goals: Week 1:  PT Short Term Goal 1 (Week 1): Pt will complete transfer min assist x1 PT Short Term Goal 1 - Progress (Week 1): Met PT Short Term Goal 2 (Week 1): Pt will complete bed mobility with min assist PT Short Term Goal 2 - Progress (Week 1): Progressing toward goal PT Short Term Goal 3 (Week 1): Pt will complete WC mobility with supervision 150' PT Short Term Goal 3 - Progress (Week 1): Partly met PT Short Term Goal 4 (Week 1): Pt will initiate gait training PT Short Term Goal 4 - Progress (Week 1): Met Week 2:  PT Short Term Goal 1 (Week 2): Pt will complete sit<>stand to standing balance with Mod A +1 PT Short Term Goal 2 (Week 2): Pt will complete squat pivot with overall MinA. PT Short Term Goal 3 (Week 2): Pt will complete bed mobility with MinA. PT Short Term Goal 4 (Week 2): Pt will complete WC mobility with supervision more than 150 ft. PT Short Term Goal 5 (Week 2): Pt will ambulate at least 100 ft using HW with ModA.  Skilled Therapeutic Interventions/Progress Updates:  Patient seated upright in w/c on entrance to room. Patient alert and agreeable to PT session.   Patient with no pain complaint at start of session.  Attempts to don new [employer] arm sling and requires MinA for support to RUE while pt uses LUE to don.   Therapeutic Activity: Transfers: Pt performed sit<>stand transfers with MinA to reach standing and then up to Mod A to maintain balance with either HW or RW. Stand pivot transfers performed during ambulation with maxA for Rle foot advancement and maintaining balance. Pt does well to compensate  toward L side for balance but has instances when he forgets that the R side does not support him and lets go of the AD. Provided verbal cues for balance.  Focus in gym on squat pivot transfers to R and L w/c<>mat table. He performs overall with close CGA and requires 2 efforts to reach each seat with good upright balance. Relates to therapist that he does not want assist. Performs once with close supervision. Continues to require attn to preparing self and space including w/c position, brakes, and removal of armrest prior to attempt.   Gait Training:  Pt ambulated 70 ft using RW with R hand saddle splint and maxA for RLE progression. Demonstrated good ability initially with lean toward L side for stance and translate R hip forward in attempt to maintain momentum and advance RLE. After fatigue, requires more than facilitation to initiate advancement. Continues to demo ability to hold RLE in flexion during stance without buckle.   Wheelchair Mobility:  Pt propelled wheelchair >150 ft with supervision. Pt does well to manage obstacles when he is aware of them on approach and maintain object in visual field. But when he forgets the object or isn't aware of how close the wall is getting, he will run into object no matter how big. Provided vc/ tc for maintaining obstacles in visual field.  Neuromuscular Re-ed: NMR facilitated during session with focus on standing balance and maintaining midline. Pt guided in sit<>stand and attaining standing balance.  Can maintain balance with CGA with weight shift toward L side and with hand hold to support [facility]). However, with weight shift toward R side facilitated by therapist with either vc or physical facilitation, pt requires guard/ block to R knee in order to maintain stance. Pt does recognize losing of balance to R and reaches out to grab any support with LUE in order to maintain. NMR performed for improvements in motor control and coordination, balance, sequencing,  judgement, and self confidence/ efficacy in performing all aspects of mobility at highest level of independence.   Patient seated upright in w/c at end of session with brakes locked, belt alarm set, and all needs within reach.   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   Therapy/Group: Individual Therapy  Loel Dubonnet PT, DPT, CSRS 10/13/2023, 6:52 PM

## 2023-10-14 DIAGNOSIS — I639 Cerebral infarction, unspecified: Secondary | ICD-10-CM | POA: Diagnosis not present

## 2023-10-14 DIAGNOSIS — I10 Essential (primary) hypertension: Secondary | ICD-10-CM | POA: Diagnosis not present

## 2023-10-14 LAB — RENAL FUNCTION PANEL
Albumin: 2.8 g/dL — ABNORMAL LOW (ref 3.5–5.0)
Anion gap: 7 (ref 5–15)
BUN: 34 mg/dL — ABNORMAL HIGH (ref 6–20)
CO2: 19 mmol/L — ABNORMAL LOW (ref 22–32)
Calcium: 8.9 mg/dL (ref 8.9–10.3)
Chloride: 110 mmol/L (ref 98–111)
Creatinine, Ser: 2.65 mg/dL — ABNORMAL HIGH (ref 0.61–1.24)
GFR, Estimated: 29 mL/min — ABNORMAL LOW (ref >60)
Glucose, Bld: 109 mg/dL — ABNORMAL HIGH (ref 70–99)
Phosphorus: 4.3 mg/dL (ref 2.5–4.6)
Potassium: 4.9 mmol/L (ref 3.5–5.1)
Sodium: 136 mmol/L (ref 135–145)

## 2023-10-14 LAB — POTASSIUM: Potassium: 4.9 mmol/L (ref 3.5–5.1)

## 2023-10-14 NOTE — Progress Notes (Signed)
Speech Language Pathology Daily Session Note  Patient Details  Name: [name] MRN: [id number] Date of Birth: Jun 19, 1976  Today's Date: 10/14/2023 SLP Individual Time: 0160-1093 SLP Individual Time Calculation (min): 45 min  Short Term Goals: Week 3: SLP Short Term Goal 1 (Week 3): Patient will demonstrate functional problem solving for basic and familair tasks with supervision verbal and visual cues. SLP Short Term Goal 2 (Week 3): Patient will self-monitor and correct phonemic errors at the word level with supervision verbal and visual cues. SLP Short Term Goal 3 (Week 3): Patient will follow 2-step commands with 70% accuracy and Min verbal cues. SLP Short Term Goal 4 (Week 3): Patient will utilize multimodal communication to express basic wants/needs with supervision A multimodal cues. SLP Short Term Goal 5 (Week 3): Patient will name functional items with 80% accuracy and Min verbal cues.  Skilled Therapeutic Interventions: Skilled therapy session focused on family education and communicative goals. SLP facilitated session by discussing patients diagnosis of aphasia and word finding strategies. SLP provided patients wife with handout regarding aphasia and effective ways to communicate. Patient and wife verbalized understanding with no further questions. SLP addressed communication through word finding activities. Patient named functional items in room with 80% accuracy given supervision-minA. Patient continues to perseverate on prior vocabulary words, though is aware and self corrects. Patient able to engage in conversation regarding family names, ages, and favorite foods with minA; demonstrating significant improvement in expressive language skills. Patient left in [facility] with call bell in reach and wife present. Continue POC.   Pain None reported   Therapy/Group: Individual Therapy  Sherrel Shafer M.A., CF-SLP 10/14/2023, 11:27 AM

## 2023-10-14 NOTE — Progress Notes (Signed)
Occupational Therapy Session Note  Patient Details  Name: [name] MRN: [id number] Date of Birth: 12-08-1976  Today's Date: 10/14/2023 OT Individual Time: 3235-5732 OT Individual Time Calculation (min): 46 min    Short Term Goals: Week 1:  OT Short Term Goal 1 (Week 1): Patient will complete toilet transfer with mod A of 1 OT Short Term Goal 1 - Progress (Week 1): Met OT Short Term Goal 2 (Week 1): Patient will recall hemi-dressing techniques with min cues OT Short Term Goal 2 - Progress (Week 1): Progressing toward goal OT Short Term Goal 3 (Week 1): Patient will complete 1 step of LB dressing task OT Short Term Goal 3 - Progress (Week 1): Met Week 2:  OT Short Term Goal 1 (Week 2): Pt will don a t shirt with min A demonstrating improved motor planning and R side awareness. OT Short Term Goal 1 - Progress (Week 2): Met OT Short Term Goal 2 (Week 2): Pt will complete squat pivot transfers to toilet and or BSC with min A. OT Short Term Goal 2 - Progress (Week 2): Met OT Short Term Goal 3 (Week 2): Pt will be able to use R hand to grasp wash cloth while helper assists moving arm to wash his L arm OT Short Term Goal 3 - Progress (Week 2): Met Week 3:  OT Short Term Goal 1 (Week 3): STGs = LTGs  Skilled Therapeutic Interventions/Progress Updates:    Pt up in wheelchair at time of session with wife present for family ed. No pain. Focus of session on family training with spouse assisting for simulated ADLs including toileting, bathing, dressing, and transfers. OT performing squat pivot with the pt wheelchair > bed toward R side and pt impulsive with R lean, educated that if transfer does not go well to get to safer surface and reset. Pt performing to the R with MOD/MAX A and back to the L with MIN/CGA. Wife performing as well with good body mechanics. Pt performing toilet transfer with grab bar that will be placed in home downstairs 1/2 bath on the R side of the toilet, attempting both  squat pivot and stand pivot, note that pt and spouse more comfortable with stand pivot and wife able to assist. Pt performing sit <> stands at sink level with MOD A with spouse as well and simulated dress/bathe. Demonstrated future set up for tub shower upstairs but made clear that pt is to sponge bathe downstairs at time of DC. Spouse educated as well on ROM for RUE. Hands on training completed, spouse feels comfortable providing assist at time of DC.   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

## 2023-10-14 NOTE — Progress Notes (Signed)
Physical Therapy Session Note  Patient Details  Name: [name] MRN: [id number] Date of Birth: 05/10/1976  Today's Date: 10/14/2023 PT Individual Time: 0919-1007 PT Individual Time Calculation (min): 48 min   Short Term Goals: Week 1:  PT Short Term Goal 1 (Week 1): Pt will complete transfer min assist x1 PT Short Term Goal 1 - Progress (Week 1): Met PT Short Term Goal 2 (Week 1): Pt will complete bed mobility with min assist PT Short Term Goal 2 - Progress (Week 1): Progressing toward goal PT Short Term Goal 3 (Week 1): Pt will complete WC mobility with supervision 150' PT Short Term Goal 3 - Progress (Week 1): Partly met PT Short Term Goal 4 (Week 1): Pt will initiate gait training PT Short Term Goal 4 - Progress (Week 1): Met Week 2:  PT Short Term Goal 1 (Week 2): Pt will complete sit<>stand to standing balance with Mod A +1 PT Short Term Goal 2 (Week 2): Pt will complete squat pivot with overall MinA. PT Short Term Goal 3 (Week 2): Pt will complete bed mobility with MinA. PT Short Term Goal 4 (Week 2): Pt will complete WC mobility with supervision more than 150 ft. PT Short Term Goal 5 (Week 2): Pt will ambulate at least 100 ft using HW with ModA.  Skilled Therapeutic Interventions/Progress Updates:  Patient seated upright in w/c on entrance to room. Patient alert and agreeable to PT session. LPN present and providing medication.   Therapeutic Activity: Discussion with wife re: preparations for d/c and additional concerns or potential needs. Discussed current use of HW vs RW and will make decision on which item pt will d/c with in the coming week.   Related upcoming appt with Harrie Jeans from [facility] for personal w/c for pt. Discussed normal progression of fitting, provision of loaner and timing for personal chair delivery. Discussed needs of w/c for pt's ease of mobility as well as her ease of portability.  She relates that there is no ramp currently and pt has 5  STE. May require non-emergency transport home but will also attempt to navigate steps this week prior to d/c for potential of pt to enter home on arrival.   Patient with no pain complaint at start of session.  Transfers and Bed Mobility: Pt performed squat pivot transfers with wife providing CGA/ minA and reaching seat in one effort. Pt relates that wife assists too much and he can perform without assist. Wife is able to perform with reduced assist and allow pt to complete on own. Demos good safety with pt. Pt is then able to reach supine and back to seated position with near Mod I using bedrails to assist.   Wheelchair Mobility:  Pt propelled wheelchair >200 feet with rest break midway through distance d/t fatigue. Initiates propel into 2nd half of distance with UB forward lean to assist with momentum build. Guided pt through five cones placed 5 ft apart in slalom in order to demo to wife pt's inattention and visual field loss to R side. Runs over cones to R side until cued to maintain visual contact with cone. Wife does see pt slide w/c along doorway and very close to wall on R side without pt able to relate distance to wall.   Patient seated upright in w/c at end of session with brakes locked, refuses alarm belt and related to pt that with wife present to provide supervision, no belt donned but when she leaves, belt will be donned  d/t fall the previous weekend. Pt agreeable. All needs in reach.   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   Therapy/Group: Individual Therapy  Loel Dubonnet PT, DPT, CSRS 10/14/2023, 1:33 PM

## 2023-10-14 NOTE — Progress Notes (Signed)
PROGRESS NOTE   Subjective/Complaints:  Pt doing well, slept ok, denies pain, LBM this morning, urinating fine. Denies any other complaints or concerns today.   ROS: Limited by aphasia although able to communicate better, denies CP, SOB, abd pain, n/v/d/c.  Pertinent positives above.  Objective:   No results found. No results for input(s): "WBC", "HGB", "HCT", "PLT" in the last 72 hours.   Recent Labs    10/13/23 1204 10/14/23 0721 10/14/23 0722  NA 137 136  --   K 5.9* 4.9 4.9  CL 111 110  --   CO2 14* 19*  --   GLUCOSE 140* 109*  --   BUN 36* 34*  --   CREATININE 2.30* 2.65*  --   CALCIUM 8.6* 8.9  --      Intake/Output Summary (Last 24 hours) at 10/14/2023 0809 Last data filed at 10/14/2023 0500 Gross per 24 hour  Intake 240 ml  Output 1100 ml  Net -860 ml        Physical Exam: Vital Signs Blood pressure (!) 166/81, pulse 74, temperature 98.9 F (37.2 C), resp. rate 16, height 5\' 5"  (1.651 m), weight 79.8 kg, SpO2 100%.   General: No acute distress, in w/c working with therapy  Mood and affect are appropriate Heart: Regular rate and rhythm no rubs murmurs or extra sounds Lungs: Clear to auscultation, breathing unlabored, no rales or wheezes Abdomen: Positive bowel sounds, soft nontender to palpation, nondistended Extremities: No clubbing, cyanosis, or edema. B/L BKAs.  Skin: No evidence of breakdown, no evidence of rash  PRIOR EXAMS: + aphasia, comprehension improving, can remember d/c date  + Apraxic and right inattention.  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 RLE 4/5 HF, 3- KE/KF LUE and LLE grossly antigravity and against resistance, 5 out of 5   Musculoskeletal: residual limbs well shaped.  Bilateral BKA   Assessment/Plan: 1. Functional deficits which require 3+ hours per day of interdisciplinary therapy in a comprehensive inpatient  rehab setting. Physiatrist is providing close team supervision and 24 hour management of active medical problems listed below. Physiatrist and rehab team continue to assess barriers to discharge/monitor patient progress toward functional and medical goals  Care Tool:  Bathing    Body parts bathed by patient: Right arm, Chest, Abdomen, Front perineal area, Buttocks, Right upper leg, Left upper leg, Face   Body parts bathed by helper: Left arm Body parts n/a: Left lower leg, Right lower leg   Bathing assist Assist Level: Minimal Assistance - Patient > 75%     Upper Body Dressing/Undressing Upper body dressing   What is the patient wearing?: Pull over shirt    Upper body assist Assist Level: Contact Guard/Touching assist    Lower Body Dressing/Undressing Lower body dressing      What is the patient wearing?: Pants, Underwear/pull up     Lower body assist Assist for lower body dressing: Moderate Assistance - Patient 50 - 74%     Toileting Toileting    Toileting assist Assist for toileting: Maximal Assistance - Patient 25 - 49%     Transfers Chair/bed transfer  Transfers assist  Chair/bed transfer activity did not occur: Safety/medical concerns  Chair/bed transfer assist level: Minimal Assistance - Patient > 75%     Locomotion Ambulation   Ambulation assist   Ambulation activity did not occur: Safety/medical concerns  Assist level: Moderate Assistance - Patient 50 - 74% Assistive device: Prothesis Max distance: 32ft   Walk 10 feet activity   Assist  Walk 10 feet activity did not occur: Safety/medical concerns  Assist level: Moderate Assistance - Patient - 50 - 74% Assistive device: Walker-hemi, Prothesis   Walk 50 feet activity   Assist Walk 50 feet with 2 turns activity did not occur: Safety/medical concerns  Assist level: Moderate Assistance - Patient - 50 - 74% Assistive device: Walker-hemi, Prothesis    Walk 150 feet activity   Assist  Walk 150 feet activity did not occur: Safety/medical concerns         Walk 10 feet on uneven surface  activity   Assist Walk 10 feet on uneven surfaces activity did not occur: Safety/medical concerns         Wheelchair     Assist Is the patient using a wheelchair?: Yes Type of Wheelchair: Manual    Wheelchair assist level: Supervision/Verbal cueing Max wheelchair distance: 100    Wheelchair 50 feet with 2 turns activity    Assist        Assist Level: Supervision/Verbal cueing   Wheelchair 150 feet activity     Assist      Assist Level: Minimal Assistance - Patient > 75%   Blood pressure (!) 166/81, pulse 74, temperature 98.9 F (37.2 C), resp. rate 16, height 5\' 5"  (1.651 m), weight 79.8 kg, SpO2 100%.  Medical Problem List and Plan: 1. 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  Antithrombotics: ASA 81mg  resumed per neuro recs , recheck CT head on 10/16 improved will resume plavix per Dr Roda Shutters, neuro recs -DVT/anticoagulation:  see above  -antiplatelet therapy: Aspirin and Plavix for three months followed by aspirin alone (started 9/26)- See above    3. Pain Management: Tylenol as needed   4. Mood/Behavior/Sleep: LCSW to evaluate and provide emotional support             -antipsychotic agents: n/a   -Melatonin 5mg  at bedtime  5. Neuropsych/cognition: This patient is not quite capable of making decisions on his own behalf.   -language improving. -may have  capacity soon.   6. Skin/Wound Care: Routine skin care checks -Discussed with nursing good skin check of bilateral residual limbs once in bed at rehab   7. Fluids/Electrolytes/Nutrition: strict Is and Os and follow-up chemistries   8: Hypertension: monitor TID and prn             -continue hydralazine 50 mg BID             -continue carvedilol 6.25 mg BID             -continue terazosin 4 mg q HS   -  10/7: Increase Hydralazine to 50 mg TID -10/11 good  control , improving   -10/12-13/24 BPs looking good, cont regimen Systolic elevation  - likely with IVF , d/c IVF and observe   -10/14/23 BPs variable, monitor for trend for now.  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 Renal signed off -myfortic restarted per nephro 10/15           Creat trending up will ask nephro to follow - appreciate nephro reconsult   -10/14/23 Cr 2.65 today, nephro following 13: GERD: continue protonix 40mg  BID   14: DM s/p pancreas transplant: CBGs QID; A1c = 5.8% (no home  meds listed)             -discontinue SSI             -started carb modified diet- pt refuses now on reg diet (same as home)    - well controlled, continue current regimen 10/10  15: Nausea: resolved -discontinue Reglan -continue PPI -Zofran prn   15: UTI: E. coli              -completed abx--observe   16: Urinary retention: Resolved             -10/6-continue PVRs thru today perhaps, in and out cath PRN   -pvr's all lesss than 160cc now, continent  -eob to void when possible  -terazosin now at 4 mg at bedtime               17: Hypokalemia: repleted; follow-up BMP -10-8 resolved with daily supplementation daily   18.  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

## 2023-10-15 DIAGNOSIS — I10 Essential (primary) hypertension: Secondary | ICD-10-CM | POA: Diagnosis not present

## 2023-10-15 DIAGNOSIS — I639 Cerebral infarction, unspecified: Secondary | ICD-10-CM | POA: Diagnosis not present

## 2023-10-15 LAB — RENAL FUNCTION PANEL
Albumin: 2.8 g/dL — ABNORMAL LOW (ref 3.5–5.0)
Anion gap: 8 (ref 5–15)
BUN: 35 mg/dL — ABNORMAL HIGH (ref 6–20)
CO2: 20 mmol/L — ABNORMAL LOW (ref 22–32)
Calcium: 8.9 mg/dL (ref 8.9–10.3)
Chloride: 110 mmol/L (ref 98–111)
Creatinine, Ser: 2.55 mg/dL — ABNORMAL HIGH (ref 0.61–1.24)
GFR, Estimated: 30 mL/min — ABNORMAL LOW (ref >60)
Glucose, Bld: 127 mg/dL — ABNORMAL HIGH (ref 70–99)
Phosphorus: 4.2 mg/dL (ref 2.5–4.6)
Potassium: 4.5 mmol/L (ref 3.5–5.1)
Sodium: 138 mmol/L (ref 135–145)

## 2023-10-15 NOTE — Progress Notes (Signed)
PROGRESS NOTE   Subjective/Complaints:  Pt doing well, eating breakfast. Slept ok, denies pain, LBM yesterday, urinating fine. Denies any other complaints or concerns today.   ROS: Limited by aphasia although able to communicate better, denies CP, SOB, abd pain, n/v/d/c.  Pertinent positives above.  Objective:   No results found. No results for input(s): "WBC", "HGB", "HCT", "PLT" in the last 72 hours.   Recent Labs    10/14/23 0721 10/14/23 0722 10/15/23 0713  NA 136  --  138  K 4.9 4.9 4.5  CL 110  --  110  CO2 19*  --  20*  GLUCOSE 109*  --  127*  BUN 34*  --  35*  CREATININE 2.65*  --  2.55*  CALCIUM 8.9  --  8.9     Intake/Output Summary (Last 24 hours) at 10/15/2023 1034 Last data filed at 10/15/2023 0800 Gross per 24 hour  Intake 460 ml  Output 800 ml  Net -340 ml        Physical Exam: Vital Signs Blood pressure (!) 159/75, pulse 73, temperature 98.7 F (37.1 C), resp. rate 16, height 5\' 5"  (1.651 m), weight 79.8 kg, SpO2 100%.   General: No acute distress, in bed eating breakfast Mood and affect are appropriate Heart: Regular rate and rhythm no rubs murmurs or extra sounds Lungs: Clear to auscultation, breathing unlabored, no rales or wheezes Abdomen: Positive bowel sounds, soft nontender to palpation, nondistended Extremities: No clubbing, cyanosis, or edema. B/L BKAs.  Skin: No evidence of breakdown, no evidence of rash  PRIOR EXAMS: + aphasia, comprehension improving, can remember d/c date  + Apraxic and right inattention.  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 RLE 4/5 HF, 3- KE/KF LUE and LLE grossly antigravity and against resistance, 5 out of 5   Musculoskeletal: residual limbs well shaped.  Bilateral BKA   Assessment/Plan: 1. Functional deficits which require 3+ hours per day of interdisciplinary therapy in a comprehensive  inpatient rehab setting. Physiatrist is providing close team supervision and 24 hour management of active medical problems listed below. Physiatrist and rehab team continue to assess barriers to discharge/monitor patient progress toward functional and medical goals  Care Tool:  Bathing    Body parts bathed by patient: Right arm, Chest, Abdomen, Front perineal area, Buttocks, Right upper leg, Left upper leg, Face   Body parts bathed by helper: Left arm Body parts n/a: Left lower leg, Right lower leg   Bathing assist Assist Level: Minimal Assistance - Patient > 75%     Upper Body Dressing/Undressing Upper body dressing   What is the patient wearing?: Pull over shirt    Upper body assist Assist Level: Contact Guard/Touching assist    Lower Body Dressing/Undressing Lower body dressing      What is the patient wearing?: Pants, Underwear/pull up     Lower body assist Assist for lower body dressing: Moderate Assistance - Patient 50 - 74%     Toileting Toileting    Toileting assist Assist for toileting: Maximal Assistance - Patient 25 - 49%     Transfers Chair/bed transfer  Transfers assist  Chair/bed transfer activity did not occur: Safety/medical concerns  Chair/bed transfer assist level: Minimal Assistance - Patient > 75%     Locomotion Ambulation   Ambulation assist   Ambulation activity did not occur: Safety/medical concerns  Assist level: Moderate Assistance - Patient 50 - 74% Assistive device: Prothesis Max distance: 26ft   Walk 10 feet activity   Assist  Walk 10 feet activity did not occur: Safety/medical concerns  Assist level: Moderate Assistance - Patient - 50 - 74% Assistive device: Walker-hemi, Prothesis   Walk 50 feet activity   Assist Walk 50 feet with 2 turns activity did not occur: Safety/medical concerns  Assist level: Moderate Assistance - Patient - 50 - 74% Assistive device: Walker-hemi, Prothesis    Walk 150 feet  activity   Assist Walk 150 feet activity did not occur: Safety/medical concerns         Walk 10 feet on uneven surface  activity   Assist Walk 10 feet on uneven surfaces activity did not occur: Safety/medical concerns         Wheelchair     Assist Is the patient using a wheelchair?: Yes Type of Wheelchair: Manual    Wheelchair assist level: Supervision/Verbal cueing Max wheelchair distance: 100    Wheelchair 50 feet with 2 turns activity    Assist        Assist Level: Supervision/Verbal cueing   Wheelchair 150 feet activity     Assist      Assist Level: Minimal Assistance - Patient > 75%   Blood pressure (!) 159/75, pulse 73, temperature 98.7 F (37.1 C), resp. rate 16, height 5\' 5"  (1.651 m), weight 79.8 kg, SpO2 100%.  Medical Problem List and Plan: 1. 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  Antithrombotics: ASA 81mg  resumed per neuro recs , recheck CT head on 10/16 improved will resume plavix per Dr Roda Shutters, neuro recs -DVT/anticoagulation:  see above  -antiplatelet therapy: Aspirin and Plavix for three months followed by aspirin alone (started 9/26)- See above    3. Pain Management: Tylenol as needed   4. Mood/Behavior/Sleep: LCSW to evaluate and provide emotional support             -antipsychotic agents: n/a   -Melatonin 5mg  at bedtime  5. Neuropsych/cognition: This patient is not quite capable of making decisions on his own behalf.   -language  improving. -may have capacity soon.   6. Skin/Wound Care: Routine skin care checks -Discussed with nursing good skin check of bilateral residual limbs once in bed at rehab   7. Fluids/Electrolytes/Nutrition: strict Is and Os and follow-up chemistries   8: Hypertension: monitor TID and prn             -continue hydralazine 50 mg BID             -continue carvedilol 6.25 mg BID             -continue terazosin 4 mg q HS   -  10/7: Increase Hydralazine to 50 mg TID -10/11 good  control , improving   -10/12-13/24 BPs looking good, cont regimen Systolic elevation  - likely with IVF , d/c IVF and observe  -10/15/23 SBPs mostly 150s-160s, may need further adjustment of BP meds, monitor trend for today Vitals:   10/11/23 1346 10/11/23 2041 10/12/23 0620 10/12/23 1448  BP: 139/68 (!) 153/75 (!) 171/81 118/66   10/12/23 2030 10/12/23 2152 10/13/23 0543 10/13/23 1940  BP: (!) 150/77 (!) 157/72 139/73 (!) 147/81   10/14/23 0532 10/14/23 1436 10/14/23 2104 10/15/23 0525  BP: (!) 166/81 (!) 167/78 (!) 158/76 (!) 159/75     9: Hyperlipidemia: continue rosuvastatin 20mg  daily   10: 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 Renal signed off -myfortic restarted per nephro 10/15           Creat trending up will ask nephro to follow - appreciate nephro reconsult   -10/14/23 Cr 2.65 today, nephro following? -10/15/23 Cr 2.55 today, nephro  said Cr back to baseline on 10/17 note, appears they are no longer following him; reassuring that it's downtrending. Continue to monitor.  13: GERD: continue protonix 40mg  BID   14: DM s/p pancreas transplant: CBGs QID; A1c = 5.8% (no home meds listed)             -discontinue SSI             -started carb modified diet- pt refuses now on reg diet (same as home)    - well controlled, continue current regimen 10/10  15: Nausea: resolved -discontinue Reglan -continue PPI -Zofran prn   15: UTI: E. coli              -completed abx--observe   16: Urinary retention: Resolved             -10/6-continue PVRs thru today perhaps, in and out cath PRN   -pvr's all lesss than 160cc now, continent  -eob to void when possible  -terazosin now at 4 mg at bedtime               17: Hypokalemia: repleted; follow-up BMP -10-8 resolved with daily supplementation daily   18.  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

## 2023-10-16 DIAGNOSIS — N1832 Chronic kidney disease, stage 3b: Secondary | ICD-10-CM | POA: Diagnosis not present

## 2023-10-16 DIAGNOSIS — Z89512 Acquired absence of left leg below knee: Secondary | ICD-10-CM | POA: Diagnosis not present

## 2023-10-16 DIAGNOSIS — I10 Essential (primary) hypertension: Secondary | ICD-10-CM | POA: Diagnosis not present

## 2023-10-16 DIAGNOSIS — I639 Cerebral infarction, unspecified: Secondary | ICD-10-CM | POA: Diagnosis not present

## 2023-10-16 LAB — CBC
HCT: 43.2 % (ref 39.0–52.0)
Hemoglobin: 13.5 g/dL (ref 13.0–17.0)
MCH: 30.3 pg (ref 26.0–34.0)
MCHC: 31.3 g/dL (ref 30.0–36.0)
MCV: 97.1 fL (ref 80.0–100.0)
Platelets: 263 10*3/uL (ref 150–400)
RBC: 4.45 MIL/uL (ref 4.22–5.81)
RDW: 17.3 % — ABNORMAL HIGH (ref 11.5–15.5)
WBC: 7.4 10*3/uL (ref 4.0–10.5)
nRBC: 0 % (ref 0.0–0.2)

## 2023-10-16 LAB — BASIC METABOLIC PANEL
Anion gap: 11 (ref 5–15)
BUN: 37 mg/dL — ABNORMAL HIGH (ref 6–20)
CO2: 19 mmol/L — ABNORMAL LOW (ref 22–32)
Calcium: 9.5 mg/dL (ref 8.9–10.3)
Chloride: 107 mmol/L (ref 98–111)
Creatinine, Ser: 2.44 mg/dL — ABNORMAL HIGH (ref 0.61–1.24)
GFR, Estimated: 32 mL/min — ABNORMAL LOW (ref >60)
Glucose, Bld: 143 mg/dL — ABNORMAL HIGH (ref 70–99)
Potassium: 4.3 mmol/L (ref 3.5–5.1)
Sodium: 137 mmol/L (ref 135–145)

## 2023-10-16 MED ORDER — CARVEDILOL 12.5 MG PO TABS
12.5000 mg | ORAL_TABLET | Freq: Two times a day (BID) | ORAL | Status: DC
  Administered 2023-10-16 – 2023-10-19 (×6): 12.5 mg via ORAL
  Filled 2023-10-16 (×7): qty 1

## 2023-10-16 NOTE — Progress Notes (Signed)
Patient ID: [name], male   DOB: 1976-10-20, 47 y.o.   MRN: [id number]  Hospital bed and rolling walker ordered through [employer].

## 2023-10-16 NOTE — Progress Notes (Signed)
Patient ID: [name], male   DOB: 12-10-1976, 47 y.o.   MRN: [id number]   PCS resources left in room. Spouse notified.

## 2023-10-16 NOTE — Progress Notes (Signed)
Physical Therapy Session Note  Patient Details  Name: [name] MRN: [id number] Date of Birth: Mar 01, 1976  Today's Date: 10/16/2023 PT Individual Time: 1136-1215 PT Individual Time Calculation (min): 39 min   Short Term Goals: Week 1:  PT Short Term Goal 1 (Week 1): Pt will complete transfer min assist x1 PT Short Term Goal 1 - Progress (Week 1): Met PT Short Term Goal 2 (Week 1): Pt will complete bed mobility with min assist PT Short Term Goal 2 - Progress (Week 1): Progressing toward goal PT Short Term Goal 3 (Week 1): Pt will complete WC mobility with supervision 150' PT Short Term Goal 3 - Progress (Week 1): Partly met PT Short Term Goal 4 (Week 1): Pt will initiate gait training PT Short Term Goal 4 - Progress (Week 1): Met Week 2:  PT Short Term Goal 1 (Week 2): Pt will complete sit<>stand to standing balance with Mod A +1 PT Short Term Goal 2 (Week 2): Pt will complete squat pivot with overall MinA. PT Short Term Goal 3 (Week 2): Pt will complete bed mobility with MinA. PT Short Term Goal 4 (Week 2): Pt will complete WC mobility with supervision more than 150 ft. PT Short Term Goal 5 (Week 2): Pt will ambulate at least 100 ft using HW with ModA. Week 3:     Skilled Therapeutic Interventions/Progress Updates:      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), R hemi, avoid hypotension Restrictions Weight Bearing Restrictions: No General:   Vital Signs: Therapy Vitals Temp: 98.6 F (37 C) Pulse Rate: 76 Resp: 20 BP: (!) 174/82 Oxygen Therapy SpO2: 95 % O2 Device: Room Air Pain:   Mobility:   Locomotion :    Trunk/Postural Assessment :    Balance:   Exercises:   Other Treatments:      Therapy/Group: Individual Therapy  Loel Dubonnet PT, DPT, CSRS 10/16/2023, 5:46 PM

## 2023-10-16 NOTE — Progress Notes (Signed)
Speech Language Pathology Daily Session Note  Patient Details  Name: [name] MRN: [id number] Date of Birth: 19-Oct-1976  Today's Date: 10/16/2023 SLP Individual Time: 0802-0900 SLP Individual Time Calculation (min): 58 min  Short Term Goals: Week 3: SLP Short Term Goal 1 (Week 3): Patient will demonstrate functional problem solving for basic and familair tasks with supervision verbal and visual cues. SLP Short Term Goal 2 (Week 3): Patient will self-monitor and correct phonemic errors at the word level with supervision verbal and visual cues. SLP Short Term Goal 3 (Week 3): Patient will follow 2-step commands with 70% accuracy and Min verbal cues. SLP Short Term Goal 4 (Week 3): Patient will utilize multimodal communication to express basic wants/needs with supervision A multimodal cues. SLP Short Term Goal 5 (Week 3): Patient will name functional items with 80% accuracy and Min verbal cues.  Skilled Therapeutic Interventions:  Patient was seen in am to address expressive and receptive language. Patient was alert and seated upright in WC half dressed. Pt attempting to express wanting to get dressed. SLP along with assigned NT assisted pt in putting on his shirt and pulling up his pants. Pt noted to be impulsive during task c/b attempting to stand without support and leaning back attempting to pull up his own pants while tilting WC. Pt was redirected to safer completion of task with mod A. SLP challenged pt in production of biographical information. Pt with improved verbalization of biographical information since last time this SLP saw him. He completed task with 70% acc indep improving to 100% with mod cues. He completed sentences with single words with 64% acc indep improving to 83% acc with mod A. SLP further challenged pt in naming functional items around room which he completed with 75% acc with mod A. Errors consisted of phonemic paraphaisas and neologisms. Pt was mostly aware of  communication errors. Pt was left seated upright in WC with call button within reach. SLP to continue POC.   Pain Pain Assessment Pain Scale: 0-10 Pain Score: 0-No pain  Therapy/Group: Individual Therapy  Renaee Munda 10/16/2023, 8:55 AM

## 2023-10-16 NOTE — Progress Notes (Signed)
Occupational Therapy Session Note  Patient Details  Name: [name] MRN: [id number] Date of Birth: 1976/03/18  Today's Date: 10/16/2023 OT Individual Time: 9562-1308 OT Individual Time Calculation (min): 75 min    Short Term Goals: Week 1:  OT Short Term Goal 1 (Week 1): Patient will complete toilet transfer with mod A of 1 OT Short Term Goal 1 - Progress (Week 1): Met OT Short Term Goal 2 (Week 1): Patient will recall hemi-dressing techniques with min cues OT Short Term Goal 2 - Progress (Week 1): Progressing toward goal OT Short Term Goal 3 (Week 1): Patient will complete 1 step of LB dressing task OT Short Term Goal 3 - Progress (Week 1): Met Week 2:  OT Short Term Goal 1 (Week 2): Pt will don a t shirt with min A demonstrating improved motor planning and R side awareness. OT Short Term Goal 1 - Progress (Week 2): Met OT Short Term Goal 2 (Week 2): Pt will complete squat pivot transfers to toilet and or BSC with min A. OT Short Term Goal 2 - Progress (Week 2): Met OT Short Term Goal 3 (Week 2): Pt will be able to use R hand to grasp wash cloth while helper assists moving arm to wash his L arm OT Short Term Goal 3 - Progress (Week 2): Met Week 3:  OT Short Term Goal 1 (Week 3): STGs = LTGs  Skilled Therapeutic Interventions/Progress Updates:    Pt received in w/c ready for therapy. Pt dressed and ready for the day.   Focus of therapy session on RUE NMR for functional use.  Pt taken to gym and used his hemiwalker with min A to transfer w/c to mat.   Pt sat on mat for entire session and demonstrated excellent attention to all activities: -a/arom sh flexion and tricep extension while pt maintained grasp on hemiwalker handle to tip walker back and forth.  -estim on deltoids for 20 min to engage in forward reach  during on phase of stimulation and during off phase pt worked on active shoulder retraction and extension pushing against my resistance -transferred movements to a  functional reach activity of picking up cones and bringing towards mouth to simulate taking a drink. Pt has active radial deviation to tilt cup.  To help facilitate movement of elbow flexion,place estim on biceps for 10 minutes.  -active pronation and supination with holding cone with forearm weight supported by therapist, then added in motion of elbow flexion with a/arom to bring cone toward shoulder He has made strong progress with forearm mobility and developing tricep strength.  He is attending to RUE more actively with fewer cues. Pt used hemiwalker to transfer back to wc to his R with min -mod A but needed A to adjust R foot and move foot.  Pt returned to room and set up with all needs met.  Upon starting to put on his lap belt alarm,  pt refused stating he has not been wearing it.  Notified RN.          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), R hemi, avoid hypotension Restrictions Weight Bearing Restrictions: No    Vital Signs: Therapy Vitals Temp: 98.2 F (36.8 C) Pulse Rate: 74 Resp: 17 BP: (!) 148/80 Patient Position (if appropriate): Lying Oxygen Therapy SpO2: 100 % O2 Device: Room Air Pain: Pain Assessment Pain Scale: 0-10 Pain Score: 0-No pain ADL: ADL Eating: Set up Grooming:  Setup Upper Body Bathing: Minimal assistance Where Assessed-Upper Body Bathing: Shower Lower Body Bathing: Contact guard Where Assessed-Lower Body Bathing: Shower Upper Body Dressing: Contact guard Where Assessed-Upper Body Dressing: Wheelchair Lower Body Dressing: Moderate assistance Where Assessed-Lower Body Dressing: Wheelchair Toileting: Maximal assistance Toilet Transfer: Minimal Web designer: Psychiatric nurse: Insurance underwriter Method: Administrator: Emergency planning/management officer, Grab  bars   Therapy/Group: Individual Therapy  Leslieann Whisman 10/16/2023, 8:55 AM

## 2023-10-16 NOTE — Progress Notes (Signed)
This Patient continue to refuse his Heparin injection, education provided and states he will take his Heparin in the morning.[name]

## 2023-10-16 NOTE — Progress Notes (Signed)
PROGRESS NOTE   Subjective/Complaints: Remains aphasic able to state day and date  ROS: Limited by aphasia although able to communicate better, denies CP, SOB, abd pain, n/v/d/c.  Pertinent positives above.  Objective:   No results found. Recent Labs    10/16/23 0540  WBC 7.4  HGB 13.5  HCT 43.2  PLT 263     Recent Labs    10/15/23 0713 10/16/23 0540  NA 138 137  K 4.5 4.3  CL 110 107  CO2 20* 19*  GLUCOSE 127* 143*  BUN 35* 37*  CREATININE 2.55* 2.44*  CALCIUM 8.9 9.5     Intake/Output Summary (Last 24 hours) at 10/16/2023 0929 Last data filed at 10/16/2023 0800 Gross per 24 hour  Intake 1016 ml  Output 2000 ml  Net -984 ml        Physical Exam: Vital Signs Blood pressure (!) 148/80, pulse 74, temperature 98.2 F (36.8 C), resp. rate 17, height 5\' 5"  (1.651 m), weight 79.8 kg, SpO2 100%.   General: No acute distress, in bed eating breakfast Mood and affect are appropriate Heart: Regular rate and rhythm no rubs murmurs or extra sounds Lungs: Clear to auscultation, breathing unlabored, no rales or wheezes Abdomen: Positive bowel sounds, soft nontender to palpation, nondistended Extremities: No clubbing, cyanosis, or edema. B/L BKAs.  Skin: No evidence of breakdown, no evidence of rash  PRIOR EXAMS: + aphasia, comprehension improving, can remember d/c date  + Apraxic and right inattention.  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 RLE 4/5 HF, 3- KE/KF LUE and LLE grossly antigravity and against resistance, 5 out of 5   Musculoskeletal: residual limbs well shaped.  Bilateral BKA   Assessment/Plan: 1. Functional deficits which require 3+ hours per day of interdisciplinary therapy in a comprehensive inpatient rehab setting. Physiatrist is providing close team supervision and 24 hour management of active medical problems listed  below. Physiatrist and rehab team continue to assess barriers to discharge/monitor patient progress toward functional and medical goals  Care Tool:  Bathing    Body parts bathed by patient: Right arm, Chest, Abdomen, Front perineal area, Buttocks, Right upper leg, Left upper leg, Face   Body parts bathed by helper: Left arm Body parts n/a: Left lower leg, Right lower leg   Bathing assist Assist Level: Minimal Assistance - Patient > 75%     Upper Body Dressing/Undressing Upper body dressing   What is the patient wearing?: Pull over shirt    Upper body assist Assist Level: Contact Guard/Touching assist    Lower Body Dressing/Undressing Lower body dressing      What is the patient wearing?: Pants, Underwear/pull up     Lower body assist Assist for lower body dressing: Moderate Assistance - Patient 50 - 74%     Toileting Toileting    Toileting assist Assist for toileting: Maximal Assistance - Patient 25 - 49%     Transfers Chair/bed transfer  Transfers assist  Chair/bed transfer activity did not occur: Safety/medical concerns  Chair/bed transfer assist level: Minimal Assistance - Patient > 75%     Locomotion Ambulation   Ambulation  assist   Ambulation activity did not occur: Safety/medical concerns  Assist level: Moderate Assistance - Patient 50 - 74% Assistive device: Prothesis Max distance: 30ft   Walk 10 feet activity   Assist  Walk 10 feet activity did not occur: Safety/medical concerns  Assist level: Moderate Assistance - Patient - 50 - 74% Assistive device: Walker-hemi, Prothesis   Walk 50 feet activity   Assist Walk 50 feet with 2 turns activity did not occur: Safety/medical concerns  Assist level: Moderate Assistance - Patient - 50 - 74% Assistive device: Walker-hemi, Prothesis    Walk 150 feet activity   Assist Walk 150 feet activity did not occur: Safety/medical concerns         Walk 10 feet on uneven surface   activity   Assist Walk 10 feet on uneven surfaces activity did not occur: Safety/medical concerns         Wheelchair     Assist Is the patient using a wheelchair?: Yes Type of Wheelchair: Manual    Wheelchair assist level: Supervision/Verbal cueing Max wheelchair distance: 100    Wheelchair 50 feet with 2 turns activity    Assist        Assist Level: Supervision/Verbal cueing   Wheelchair 150 feet activity     Assist      Assist Level: Minimal Assistance - Patient > 75%   Blood pressure (!) 148/80, pulse 74, temperature 98.2 F (36.8 C), resp. rate 17, height 5\' 5"  (1.651 m), weight 79.8 kg, SpO2 100%.  Medical Problem List and Plan: 1. 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  Antithrombotics: ASA 81mg  resumed per neuro recs , recheck CT head on 10/16 improved will resume plavix per Dr Roda Shutters, neuro recs -DVT/anticoagulation:  see above  -antiplatelet therapy: Aspirin and Plavix for three months followed by aspirin alone (started 9/26)- See above    3. Pain Management: Tylenol as needed   4. Mood/Behavior/Sleep: LCSW to evaluate and provide emotional support             -antipsychotic agents: n/a   -Melatonin 5mg  at bedtime  5. Neuropsych/cognition: This patient is not quite capable of making decisions on his own behalf.   -language improving. -may have capacity soon.   6. Skin/Wound Care: Routine skin care checks -Discussed with nursing good skin check of  bilateral residual limbs once in bed at rehab   7. Fluids/Electrolytes/Nutrition: strict Is and Os and follow-up chemistries   8: Hypertension: monitor TID and prn             -continue hydralazine 50 mg BID             -Increase carvedilol to 12.5 mg BID             -continue terazosin 4 mg q HS   -10/7: Increase Hydralazine to 50 mg TID -10/11 good  control , improving   -10/12-13/24 BPs looking good, cont regimen Systolic elevation  - likely  with IVF , d/c IVF and observe  -10/15/23 SBPs mostly 150s-160s, may need further adjustment of BP meds, monitor trend for today Vitals:   10/12/23 1448 10/12/23 2030 10/12/23 2152 10/13/23 0543  BP: 118/66 (!) 150/77 (!) 157/72 139/73   10/13/23 1940 10/14/23 0532 10/14/23 1436 10/14/23 2104  BP: (!) 147/81 (!) 166/81 (!) 167/78 (!) 158/76   10/15/23 0525 10/15/23 1330 10/15/23 1957 10/16/23 0548  BP: (!) 159/75 (!) 142/76 (!) 160/70 (!) 148/80     9: Hyperlipidemia: continue rosuvastatin 20mg  daily   10: 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 Renal signed off -myfortic restarted per nephro 10/15           Creat trending up will ask nephro to follow - appreciate nephro reconsult   -10/14/23 Cr 2.65 today, nephro following? -10/15/23 Cr 2.55 today, nephro said Cr back to baseline on 10/17 note, appears they are no longer following him; reassuring that it's downtrending. Continue  to monitor.  13: GERD: continue protonix 40mg  BID   14: DM s/p pancreas transplant: CBGs QID; A1c = 5.8% (no home meds listed)             -discontinue SSI             -started carb modified diet- pt refuses now on reg diet (same as home)    - well controlled, continue current regimen 10/10  15: Nausea: resolved -discontinue Reglan -continue PPI -Zofran prn   15: UTI: E. coli              -completed abx--observe   16: Urinary retention: Resolved             -10/6-continue PVRs thru today perhaps, in and out cath PRN   -pvr's all lesss than 160cc now, continent  -eob to void when possible  -terazosin now at 4 mg at bedtime               17: Hypokalemia: repleted; follow-up BMP -10-8 resolved with daily supplementation daily   18.  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

## 2023-10-16 NOTE — Progress Notes (Signed)
Patient ID: [name], male   DOB: [birth year], 47 y.o.   MRN: [id number]  [facility] established with [facility]. Orders submitted. Sw spoke with spouse to discuss HB. Spouse plans to have a family member present in the home to accept the delivery.  SW discuss PCS services with spouse. Spouse agreeable and Sw will submitted PCS referral. SW will provide spouse with information today during lunch. No additional questions or concerns.

## 2023-10-16 NOTE — Progress Notes (Signed)
Patient refused Heparin SQ asnd educated regarding refusal of meds , States he does not want it. it

## 2023-10-16 NOTE — Progress Notes (Signed)
Physical Therapy Session Note  Patient Details  Name: RILYNN GOODWYN MRN: [id number] Date of Birth: [birth year]  Today's Date: 10/16/2023 PT Individual Time: 1517-1606 PT Individual Time Calculation (min): 49 min   Short Term Goals: Week 1:  PT Short Term Goal 1 (Week 1): Pt will complete transfer min assist x1 PT Short Term Goal 1 - Progress (Week 1): Met PT Short Term Goal 2 (Week 1): Pt will complete bed mobility with min assist PT Short Term Goal 2 - Progress (Week 1): Progressing toward goal PT Short Term Goal 3 (Week 1): Pt will complete WC mobility with supervision 150' PT Short Term Goal 3 - Progress (Week 1): Partly met PT Short Term Goal 4 (Week 1): Pt will initiate gait training PT Short Term Goal 4 - Progress (Week 1): Met Week 2:  PT Short Term Goal 1 (Week 2): Pt will complete sit<>stand to standing balance with Mod A +1 PT Short Term Goal 2 (Week 2): Pt will complete squat pivot with overall MinA. PT Short Term Goal 3 (Week 2): Pt will complete bed mobility with MinA. PT Short Term Goal 4 (Week 2): Pt will complete WC mobility with supervision more than 150 ft. PT Short Term Goal 5 (Week 2): Pt will ambulate at least 100 ft using HW with ModA. Week 3:     Skilled Therapeutic Interventions/Progress Updates:      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), R hemi, avoid hypotension Restrictions Weight Bearing Restrictions: No General:   Vital Signs: Therapy Vitals Temp: 98.6 F (37 C) Pulse Rate: 76 Resp: 20 BP: (!) 174/82 Oxygen Therapy SpO2: 95 % O2 Device: Room Air Pain:   Mobility:   Locomotion :    Trunk/Postural Assessment :    Balance:   Exercises:   Other Treatments:      Therapy/Group: Individual Therapy  Loel Dubonnet PT, DPT, CSRS 10/16/2023, 5:48 PM

## 2023-10-17 DIAGNOSIS — I10 Essential (primary) hypertension: Secondary | ICD-10-CM | POA: Diagnosis not present

## 2023-10-17 DIAGNOSIS — I639 Cerebral infarction, unspecified: Secondary | ICD-10-CM | POA: Diagnosis not present

## 2023-10-17 DIAGNOSIS — Z89512 Acquired absence of left leg below knee: Secondary | ICD-10-CM | POA: Diagnosis not present

## 2023-10-17 DIAGNOSIS — N1832 Chronic kidney disease, stage 3b: Secondary | ICD-10-CM | POA: Diagnosis not present

## 2023-10-17 NOTE — Progress Notes (Signed)
Physical Therapy Weekly Progress Note  Patient Details  Name: DELL SANTELLAN MRN: [id number] Date of Birth: September 05, 1976  Beginning of progress report period: {Time; dates multiple:304500300} End of progress report period: {Time; dates multiple:304500300}  {CHL IP REHAB PT TIME CALCULATION:304800500}  Patient has met {number 1-5:22450} of {number 1-5:20334} short term goals.  ***  Patient continues to demonstrate the following deficits {impairments:3041632} and therefore will continue to benefit from skilled PT intervention to increase functional independence with mobility.  Patient {LTG progression:3041653}.  {plan of WUJW:1191478}  PT Short Term Goals {GNF:6213086}  Skilled Therapeutic Interventions/Progress Updates:      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), R hemi, avoid hypotension Restrictions Weight Bearing Restrictions: No General:   Vital Signs: Therapy Vitals Temp: 97.9 F (36.6 C) Pulse Rate: 72 Resp: 17 BP: (!) 156/78 Patient Position (if appropriate): Lying Oxygen Therapy SpO2: 98 % O2 Device: Room Air Pain:   Vision/Perception     Mobility:   Locomotion :    Trunk/Postural Assessment :    Balance:   Exercises:   Other Treatments:     Therapy/Group: {Therapy/Group:3049007}  Loel Dubonnet 10/17/2023, 8:03 AM

## 2023-10-17 NOTE — Progress Notes (Signed)
Per therapy, pt refused his chair alarm, RN went to check patient still refusing to place chair alarm.

## 2023-10-17 NOTE — Progress Notes (Signed)
PROGRESS NOTE   Subjective/Complaints: Remains aphasic   ROS: Limited by aphasia   Objective:   No results found. Recent Labs    10/16/23 0540  WBC 7.4  HGB 13.5  HCT 43.2  PLT 263     Recent Labs    10/15/23 0713 10/16/23 0540  NA 138 137  K 4.5 4.3  CL 110 107  CO2 20* 19*  GLUCOSE 127* 143*  BUN 35* 37*  CREATININE 2.55* 2.44*  CALCIUM 8.9 9.5     Intake/Output Summary (Last 24 hours) at 10/17/2023 0713 Last data filed at 10/16/2023 2230 Gross per 24 hour  Intake 476 ml  Output 800 ml  Net -324 ml        Physical Exam: Vital Signs Blood pressure (!) 156/78, pulse 72, temperature 97.9 F (36.6 C), resp. rate 17, height 5\' 5"  (1.651 m), weight 79.8 kg, SpO2 98%.   General: No acute distress, in bed eating breakfast Mood and affect are appropriate Heart: Regular rate and rhythm no rubs murmurs or extra sounds Lungs: Clear to auscultation, breathing unlabored, no rales or wheezes Abdomen: Positive bowel sounds, soft nontender to palpation, nondistended Extremities: No clubbing, cyanosis, or edema. B/L BKAs.  Skin: No evidence of breakdown, no evidence of rash  PRIOR EXAMS: + aphasia, comprehension improving, can remember d/c date  + Apraxic and right inattention.  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 RLE 4/5 HF, 3- KE/KF LUE and LLE grossly antigravity and against resistance, 5 out of 5   Musculoskeletal: residual limbs well shaped.  Bilateral BKA   Assessment/Plan: 1. Functional deficits which require 3+ hours per day of interdisciplinary therapy in a comprehensive inpatient rehab setting. Physiatrist is providing close team supervision and 24 hour management of active medical problems listed below. Physiatrist and rehab team continue to assess barriers to discharge/monitor patient progress toward functional and medical  goals  Care Tool:  Bathing    Body parts bathed by patient: Right arm, Chest, Abdomen, Front perineal area, Buttocks, Right upper leg, Left upper leg, Face   Body parts bathed by helper: Left arm Body parts n/a: Left lower leg, Right lower leg   Bathing assist Assist Level: Minimal Assistance - Patient > 75%     Upper Body Dressing/Undressing Upper body dressing   What is the patient wearing?: Pull over shirt    Upper body assist Assist Level: Contact Guard/Touching assist    Lower Body Dressing/Undressing Lower body dressing      What is the patient wearing?: Pants, Underwear/pull up     Lower body assist Assist for lower body dressing: Moderate Assistance - Patient 50 - 74%     Toileting Toileting    Toileting assist Assist for toileting: Maximal Assistance - Patient 25 - 49%     Transfers Chair/bed transfer  Transfers assist  Chair/bed transfer activity did not occur: Safety/medical concerns  Chair/bed transfer assist level: Minimal Assistance - Patient > 75%     Locomotion Ambulation   Ambulation assist   Ambulation activity did not occur: Safety/medical concerns  Assist level: Moderate Assistance - Patient 50 -  74% Assistive device: Prothesis Max distance: 31ft   Walk 10 feet activity   Assist  Walk 10 feet activity did not occur: Safety/medical concerns  Assist level: Moderate Assistance - Patient - 50 - 74% Assistive device: Walker-hemi, Prothesis   Walk 50 feet activity   Assist Walk 50 feet with 2 turns activity did not occur: Safety/medical concerns  Assist level: Moderate Assistance - Patient - 50 - 74% Assistive device: Walker-hemi, Prothesis    Walk 150 feet activity   Assist Walk 150 feet activity did not occur: Safety/medical concerns         Walk 10 feet on uneven surface  activity   Assist Walk 10 feet on uneven surfaces activity did not occur: Safety/medical concerns         Wheelchair     Assist Is  the patient using a wheelchair?: Yes Type of Wheelchair: Manual    Wheelchair assist level: Supervision/Verbal cueing Max wheelchair distance: 100    Wheelchair 50 feet with 2 turns activity    Assist        Assist Level: Supervision/Verbal cueing   Wheelchair 150 feet activity     Assist      Assist Level: Minimal Assistance - Patient > 75%   Blood pressure (!) 156/78, pulse 72, temperature 97.9 F (36.6 C), resp. rate 17, height 5\' 5"  (1.651 m), weight 79.8 kg, SpO2 98%.  Medical Problem List and Plan: 1. 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  Antithrombotics: ASA 81mg  resumed per neuro recs , recheck CT head on 10/16 improved will resume plavix per Dr Roda Shutters, neuro recs -DVT/anticoagulation:  refusing SQ heparin , mobility improving will d/c -antiplatelet therapy: Aspirin and Plavix for three months followed by aspirin alone (started 9/26)- See above    3. Pain Management: Tylenol as needed   4. Mood/Behavior/Sleep: LCSW to evaluate and provide emotional support             -antipsychotic agents: n/a   -Melatonin 5mg  at bedtime  5. Neuropsych/cognition: This patient is not quite capable of making decisions on his own behalf.   -language improving. -may have capacity soon.   6. Skin/Wound Care: Routine skin care checks -Discussed with nursing good skin check of bilateral residual limbs once in bed at rehab   7. Fluids/Electrolytes/Nutrition: strict Is and Os and follow-up chemistries   8: Hypertension: monitor TID and prn             -continue hydralazine 50 mg BID             -Increase carvedilol to 12.5 mg BID on 10/21 monitor effect on HR and BP              -continue terazosin 4 mg q HS   -10/7: Increase Hydralazine to 50 mg TID  Vitals:   10/13/23 0543 10/13/23 1940 10/14/23 0532  10/14/23 1436  BP: 139/73 (!) 147/81 (!) 166/81 (!) 167/78   10/14/23 2104 10/15/23 0525 10/15/23 1330 10/15/23 1957  BP: (!) 158/76 (!) 159/75 (!) 142/76 (!) 160/70   10/16/23 0548 10/16/23 1512 10/16/23 1940 10/17/23 0438  BP: (!) 148/80 (!) 174/82 (!) 142/72 (!) 156/78     9: Hyperlipidemia: continue rosuvastatin 20mg  daily   10: 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 Renal signed off -myfortic restarted per nephro 10/15           Creat trending up will ask nephro to follow - appreciate nephro reconsult   -10/14/23 Cr 2.65 today, nephro following? -10/15/23 Cr 2.55 today, nephro said Cr back to baseline on 10/17 note, appears they are no longer following him; reassuring that it's downtrending. Continue to monitor.  13: GERD: continue protonix 40mg  BID   14: DM s/p pancreas transplant: CBGs QID; A1c = 5.8% (no home meds listed)             -discontinue SSI             -started carb modified diet- pt refuses now on reg diet (same as home)    - well controlled, continue current regimen 10/10  15: Nausea: resolved -discontinue Reglan -continue PPI -Zofran prn   15: UTI: E. coli              -completed abx--observe   16: Urinary retention: Resolved             -10/6-continue PVRs thru today perhaps, in and out cath PRN   -pvr's all lesss than 160cc now, continent  -eob to void when possible  -terazosin now at 4 mg  at bedtime               17: Hypokalemia: repleted; follow-up BMP -10-8 resolved with daily supplementation daily   18.  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

## 2023-10-17 NOTE — Progress Notes (Signed)
Patient ID: [name], male   DOB: [birth year], 47 y.o.   MRN: [id number]  [facility] ordered through [employer] per spouse request.

## 2023-10-17 NOTE — Progress Notes (Signed)
Occupational Therapy Session Note  Patient Details  Name: [name] MRN: [id number] Date of Birth: Jul 24, 1976  Today's Date: 10/17/2023 OT Individual Time: [id number] and 1000-1015 co tx charge time (1000-1030 total time) OT Individual Time Calculation (min): 60 min and 15 min of co tx time   Short Term Goals: Week 1:  OT Short Term Goal 1 (Week 1): Patient will complete toilet transfer with mod A of 1 OT Short Term Goal 1 - Progress (Week 1): Met OT Short Term Goal 2 (Week 1): Patient will recall hemi-dressing techniques with min cues OT Short Term Goal 2 - Progress (Week 1): Progressing toward goal OT Short Term Goal 3 (Week 1): Patient will complete 1 step of LB dressing task OT Short Term Goal 3 - Progress (Week 1): Met Week 2:  OT Short Term Goal 1 (Week 2): Pt will don a t shirt with min A demonstrating improved motor planning and R side awareness. OT Short Term Goal 1 - Progress (Week 2): Met OT Short Term Goal 2 (Week 2): Pt will complete squat pivot transfers to toilet and or BSC with min A. OT Short Term Goal 2 - Progress (Week 2): Met OT Short Term Goal 3 (Week 2): Pt will be able to use R hand to grasp wash cloth while helper assists moving arm to wash his L arm OT Short Term Goal 3 - Progress (Week 2): Met Week 3:  OT Short Term Goal 1 (Week 3): STGs = LTGs  Skilled Therapeutic Interventions/Progress Updates:    Visit 1: Pain: no c/o pain  Pt received in room with his wife as pt was completing LB dressing from w/c.  Pt was leaning side to side without his prosthetics on and hips too far forward with R brake off.  Had pt stop and emphasized it is extremely important to have B brakes on as he his moving dynamically in w/c.  Also recommended he leave prosthetics on while doffing pants over hips and to do so in standing with min A to support balance.  At this point his shorts were off and he was ready to don his jean shorts with belt.  Had him don shorts over limbs and  then reattach prosthetics. Pt able to do so with left and min A with his R.  Once on,  demonstrated to his wife how pt pushes to stand with L hand from w/c arm rest and then holds onto walker handle.  Therapist supports him under his R axilla and then supports his stand.  Had wife repeat demonstrate and practice herself.  Once standing, pt able to release L hand to pull shorts over hips. Recommended he sit to fasten snaps and buckle.  With A to support R arm, pt able to actively use R fingers to grasp pants to enable L hand to fasten snap and zipper and then used R fingers to stabilize belt as he buckled with his L hand.   Pt agreeable to trying NMES with biofeedback to his biceps. Using the [employer] Neuromove, electrodes placed on his biceps and intensity increased to level pt could tolerate but also activated his bicep for elbow flexion. Pt responded extremely well to this unit and was able to flex elbow from 30 to 95 degrees!  Integrated functional task of holding water bottle in R hand and bringing toward mouth to take a sip for multiple repetitions and then flexing elbow to "toss" bottle over his shoulder.  Worked on elbow flexion first  from hand in dependent position and then with arm on tray table.    Wife inquiring about purchasing an at home unit. Provided her with information and suggested we have MD sign a prescription form for her to have on hand.  Recommended waiting to purchase until [facility] begins as that therapist may have a unit that can be used.  Had pt demonstrate to wife exercises he has been learning that he can do at home with assist from her. Wife practiced the exercises: -resisted elbow extension for triceps  -hand on back of light weight chair (or cane) and using grasp to hold chair as he tips it back and forth for sh flex and extension a/arom -resisted elbow extension -AROM of wrist with extension and rotation while holding a cup or bottle. Wife very pleased with his progress.  Pt  resting in wc with all needs met.       Visit 2: Pain: no c/o pain Pt received in room in w/c ready for next session. Pt taken to gym to meet with his PT and W/c seating specialist.  Discussed pt's postural needs, mobility needs and his current RUE level of strength and function along with his R side perceptual deficits.  Planned which type of w/c and cushion will be most appropriate for his needs at d/c.  Pt with his PT to continue his session.   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), R hemi, avoid hypotension Restrictions Weight Bearing Restrictions: No    Vital Signs: Therapy Vitals Temp: 97.9 F (36.6 C) Pulse Rate: 72 Resp: 17 BP: (!) 156/78 Patient Position (if appropriate): Lying Oxygen Therapy SpO2: 98 % O2 Device: Room Air    ADL: ADL Eating: Set up Grooming: Setup Upper Body Bathing: Minimal assistance Where Assessed-Upper Body Bathing: Shower Lower Body Bathing: Contact guard Where Assessed-Lower Body Bathing: Shower Upper Body Dressing: Contact guard Where Assessed-Upper Body Dressing: Wheelchair Lower Body Dressing: Moderate assistance Where Assessed-Lower Body Dressing: Wheelchair Toileting: Maximal assistance Toilet Transfer: Minimal Web designer: Psychiatric nurse: Insurance underwriter Method: Administrator: Emergency planning/management officer, Grab bars   Therapy/Group: Individual Therapy and Co-Treatment  Thai Hemrick 10/17/2023, 8:27 AM

## 2023-10-17 NOTE — Progress Notes (Signed)
Patient refused telesitter. Patient was getting ready to get out of bed with no prosthetics if we place the telesitter and threatening to leave . CN notified , Celso Amy was notified.

## 2023-10-17 NOTE — Progress Notes (Signed)
Notified Dr, Kirstein's of patient refusal to take his Heparin injections

## 2023-10-17 NOTE — Progress Notes (Signed)
Patient ID: [name], male   DOB: 02-24-1976, 48 y.o.   MRN: [id number]  PCS referral submitted.  Referral faxed to [phone]

## 2023-10-17 NOTE — Progress Notes (Incomplete)
Physical Therapy Session Note  Patient Details  Name: [name] MRN: [id number] Date of Birth: 1976/10/22  {CHL IP REHAB PT TIME CALCULATION:304800500}  Short Term Goals: {WGN:5621308}  Skilled Therapeutic Interventions/Progress Updates:      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), R hemi, avoid hypotension Restrictions Weight Bearing Restrictions: No General:   Vital Signs: Therapy Vitals Temp: 97.9 F (36.6 C) Pulse Rate: 72 Resp: 17 BP: (!) 156/78 Patient Position (if appropriate): Lying Oxygen Therapy SpO2: 98 % O2 Device: Room Air Pain:   Mobility:   Locomotion :    Trunk/Postural Assessment :    Balance:   Exercises:   Other Treatments:      Therapy/Group: {Therapy/Group:3049007}  Loel Dubonnet 10/17/2023, 8:03 AM

## 2023-10-17 NOTE — Progress Notes (Signed)
Speech Language Pathology Daily Session Note  Patient Details  Name: [name] MRN: [id number] Date of Birth: 1976-01-03  Today's Date: 10/17/2023 SLP Individual Time: 1301-1400 SLP Individual Time Calculation (min): 59 min  Short Term Goals: Week 3: SLP Short Term Goal 1 (Week 3): Patient will demonstrate functional problem solving for basic and familair tasks with supervision verbal and visual cues. SLP Short Term Goal 2 (Week 3): Patient will self-monitor and correct phonemic errors at the word level with supervision verbal and visual cues. SLP Short Term Goal 3 (Week 3): Patient will follow 2-step commands with 70% accuracy and Min verbal cues. SLP Short Term Goal 4 (Week 3): Patient will utilize multimodal communication to express basic wants/needs with supervision A multimodal cues. SLP Short Term Goal 5 (Week 3): Patient will name functional items with 80% accuracy and Min verbal cues.  Skilled Therapeutic Interventions:  Patient was seen in PM to address expressive and receptive language. Pt was alert and seated upright in WC upon SLP arrival. He was agreeable for session. He answered open ended biographical questions with 75% acc improving to 83% acc with min A. Pt initially required max A for verbalizing address however by the end of session he verbalized his address with sup A. Using the LARK, pt followed 2 step directions with < 25% acc with mod cues. He also named functional items with 100% acc with mod I. Pt was subsequently challenged in generative naming task where he required mod A for list of 5-6 items. Pt was left seated upright in WC with call button within reach. SLP to continue POC.  Pain Pain Assessment Pain Scale: 0-10 Pain Score: 0-No pain  Therapy/Group: Individual Therapy  Renaee Munda 10/17/2023, 1:57 PM

## 2023-10-18 ENCOUNTER — Other Ambulatory Visit (HOSPITAL_COMMUNITY): Payer: Self-pay

## 2023-10-18 DIAGNOSIS — N1832 Chronic kidney disease, stage 3b: Secondary | ICD-10-CM | POA: Diagnosis not present

## 2023-10-18 DIAGNOSIS — Z89512 Acquired absence of left leg below knee: Secondary | ICD-10-CM | POA: Diagnosis not present

## 2023-10-18 DIAGNOSIS — I10 Essential (primary) hypertension: Secondary | ICD-10-CM | POA: Diagnosis not present

## 2023-10-18 DIAGNOSIS — I639 Cerebral infarction, unspecified: Secondary | ICD-10-CM | POA: Diagnosis not present

## 2023-10-18 MED ORDER — POTASSIUM CHLORIDE CRYS ER 10 MEQ PO TBCR
10.0000 meq | EXTENDED_RELEASE_TABLET | Freq: Every day | ORAL | 0 refills | Status: DC
  Filled 2023-10-18: qty 30, 30d supply, fill #0

## 2023-10-18 MED ORDER — SENNOSIDES-DOCUSATE SODIUM 8.6-50 MG PO TABS
1.0000 | ORAL_TABLET | Freq: Every day | ORAL | 0 refills | Status: DC
  Filled 2023-10-18: qty 30, 30d supply, fill #0

## 2023-10-18 MED ORDER — PANTOPRAZOLE SODIUM 40 MG PO TBEC
40.0000 mg | DELAYED_RELEASE_TABLET | Freq: Two times a day (BID) | ORAL | 0 refills | Status: DC
  Filled 2023-10-18: qty 60, 30d supply, fill #0

## 2023-10-18 MED ORDER — METHOCARBAMOL 500 MG PO TABS
500.0000 mg | ORAL_TABLET | Freq: Four times a day (QID) | ORAL | 0 refills | Status: DC | PRN
  Filled 2023-10-18: qty 30, 8d supply, fill #0

## 2023-10-18 MED ORDER — CLOPIDOGREL BISULFATE 75 MG PO TABS
75.0000 mg | ORAL_TABLET | Freq: Every day | ORAL | 0 refills | Status: DC
  Filled 2023-10-18: qty 30, 30d supply, fill #0

## 2023-10-18 MED ORDER — CARVEDILOL 12.5 MG PO TABS
12.5000 mg | ORAL_TABLET | Freq: Two times a day (BID) | ORAL | 0 refills | Status: DC
  Filled 2023-10-18: qty 60, 30d supply, fill #0

## 2023-10-18 MED ORDER — ROSUVASTATIN CALCIUM 20 MG PO TABS
20.0000 mg | ORAL_TABLET | Freq: Every day | ORAL | 0 refills | Status: DC
Start: 2023-10-18 — End: 2023-11-10
  Filled 2023-10-18: qty 30, 30d supply, fill #0

## 2023-10-18 MED ORDER — MELATONIN 5 MG PO TABS: 5.0000 mg | ORAL_TABLET | Freq: Every evening | ORAL | Status: DC | PRN

## 2023-10-18 MED ORDER — ACETAMINOPHEN 325 MG PO TABS: 325.0000 mg | ORAL_TABLET | ORAL | Status: AC | PRN

## 2023-10-18 NOTE — Patient Care Conference (Signed)
Inpatient RehabilitationTeam Conference and Plan of Care Update Date: 10/18/2023   Time: 10:22 AM    Patient Name: [name]      Medical Record Number: [id number]  Date of Birth: 10-09-1976 Sex: Male         Room/Bed: 4M10C/4M10C-01 Payor Info: Payor: Advertising copywriter MEDICARE / Plan: Suan Halter DUAL COMPLETE / Product Type: *No Product type* /    Admit Date/Time:  09/29/2023  5:22 PM  Primary Diagnosis:  Acute CVA (cerebrovascular accident) [facility])  Hospital Problems: Principal Problem:   Acute CVA (cerebrovascular accident) (HCC) Active Problems:   Ischemic cerebrovascular accident (CVA) due to global hypoperfusion with watershed infarction [facility])   Amnestic MCI (mild cognitive impairment with memory loss)    Expected Discharge Date: Expected Discharge Date: 10/19/23  Team Members Present: Physician leading conference: Dr. Claudette Laws Social Worker Present: Lavera Guise, BSW Nurse Present: Chana Bode, RN PT Present: Ralph Leyden, PT OT Present: Primitivo Gauze, OT SLP Present: Everardo Pacific, SLP PPS Coordinator present : Fae Pippin, SLP     Current Status/Progress Goal Weekly Team Focus  Bowel/Bladder   Patient remains continent of bowel and bladder, LBM 10/17/23   Maintain continence   Assess toileting needs and assist    Swallow/Nutrition/ Hydration               ADL's   Pt has progressed with his self care due to improved AROM of RUE and balance. MIn-mod A with self care and min A with functional mobility   min A standing balance with UE support, min sit to stands, bathing, and toilet/BSC transfers; mod A LB dressing and toileting   ADL training, RUE NMR, estim, education    Mobility   Bed mobility = supervision/ CGA; Transfers = minA to stand, then Mod/MaxA to maintain standing balance; Stand pivot = ModA with max cues for safety awareness and technique; squat pivot = CGA; Gait = MaxA with HW   overall CGA with MinA for  ambulation  Barriers: R hemi; impulsive (has gotten better), balance/// Focus: d/c today    Communication   presents wth neologisms and phonemic paraphasiax, improving ability to name functional items but continues to have signifiant expressive defiicts. REquires mod to max assist with following 2 step directions   supervision-minA   verbal communication of wants and needs, increasing accuracy of word finding, reducing neologisms and paraphasias    Safety/Cognition/ Behavioral Observations  min to mod A due to impulsivity and awareness   sup a   functional problem solving, awareness, and following multi step commands    Pain   Patient denies pain   Remain pain free   Assess pain QS/PRN    Skin   Skin is intact   Skin to remain intact while on IP Rehab  Maintain skin integrity      Discharge Planning:  Discharging home with spouse on 10/24. HH eslblished with [facility]. DME ordered. No barriers.   Team Discussion: Patient post CVA with balance issues, right field cut, impulsivity and decreased awareness of deficits with aphasia. Subluxation decreased with tome and using right side for active assist of self care.  Continues to work on following abstract multi-step commands.  Patient on target to meet rehab goals: yes, currently needs min assist for ADLs and CGA for transfers. Needs supervision for squat pivot transfers and able to ambulate using a hemi-walker with bilateral lower extremity prostheses.   *See Care Plan and progress notes for long and short-term goals.  Revisions to Treatment Plan:  Biofeedback/E-stim W/C eval   Teaching Needs: Safety, medications, dietary modification, transfers, toileting, etc.   Current Barriers to Discharge: None  Possible Resolutions to Barriers: Family education Ramp for entry to home DME: W/C, BSC, hospital bed, RW HH follow up services     Medical Summary Current Status: Renal status stabilizing, no further falls,  increased usage of bilateral prosthetics during therapy    Barriers to Discharge Comments: Status post renal and pancreatic transplant on immunosuppressive medications Possible Resolutions to [employer] Focus: Follow-up with transplant team as well as nephrology as well as neurology and physical medicine rehab, home health therapy transition to outpatient therapy   Continued Need for Acute Rehabilitation Level of Care: The patient requires daily medical management by a physician with specialized training in physical medicine and rehabilitation for the following reasons: Direction of a multidisciplinary physical rehabilitation program to maximize functional independence : Yes Medical management of patient stability for increased activity during participation in an intensive rehabilitation regime.: Yes Analysis of laboratory values and/or radiology reports with any subsequent need for medication adjustment and/or medical intervention. : Yes   I attest that I was present, lead the team conference, and concur with the assessment and plan of the team.   Chana Bode B 10/18/2023, 1:34 PM

## 2023-10-18 NOTE — Progress Notes (Signed)
Occupational Therapy Session Note  Patient Details  Name: [name] MRN: [id number] Date of Birth: 22-Nov-1976  Today's Date: 10/18/2023 OT Individual Time: 0830-0900 OT Individual Time Calculation (min): 30 min    Short Term Goals: Week 1:  OT Short Term Goal 1 (Week 1): Patient will complete toilet transfer with mod A of 1 OT Short Term Goal 1 - Progress (Week 1): Met OT Short Term Goal 2 (Week 1): Patient will recall hemi-dressing techniques with min cues OT Short Term Goal 2 - Progress (Week 1): Progressing toward goal OT Short Term Goal 3 (Week 1): Patient will complete 1 step of LB dressing task OT Short Term Goal 3 - Progress (Week 1): Met Week 2:  OT Short Term Goal 1 (Week 2): Pt will don a t shirt with min A demonstrating improved motor planning and R side awareness. OT Short Term Goal 1 - Progress (Week 2): Met OT Short Term Goal 2 (Week 2): Pt will complete squat pivot transfers to toilet and or BSC with min A. OT Short Term Goal 2 - Progress (Week 2): Met OT Short Term Goal 3 (Week 2): Pt will be able to use R hand to grasp wash cloth while helper assists moving arm to wash his L arm OT Short Term Goal 3 - Progress (Week 2): Met Week 3:  OT Short Term Goal 1 (Week 3): STGs = LTGs  Skilled Therapeutic Interventions/Progress Updates:    Pt received sitting EOB with W/C seating specialist tech bringing in new w/c for pt to take home.  Pt preparing to complete transfer to wc to his L but needed cues to stop and assess the situation as w/c was set about a foot away from the bed.  Needed cues to have chair positioned close to bed and ensure B brakes were on. Pt was then able to transfer using a sit pivot to his L with CGA.  Pt was dressed and ready for the day.    Pt agreeable to engaging in estim/biofeedback for elbow flexors.  Used [employer] on biceps at intensity 22 for 20 minutes with pt responding well to biofeedback and actively using elbow flexors.  Pt  attended well and very engaged in task.  After treatment was over pt able to actively flex elbow to 110.    Discussed his progress in therapy.  Pt is happy that he did stay in rehab as he is now more independent and now able to use his RUE as an active assist.   Pt resting in w/c with all needs met.  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

## 2023-10-18 NOTE — Plan of Care (Signed)

## 2023-10-18 NOTE — Progress Notes (Signed)
PROGRESS NOTE   Subjective/Complaints: Remains aphasic , poor fluid intake, pt aware of discharge in am   ROS: Limited by aphasia   Objective:   No results found. Recent Labs    10/16/23 0540  WBC 7.4  HGB 13.5  HCT 43.2  PLT 263     Recent Labs    10/16/23 0540  NA 137  K 4.3  CL 107  CO2 19*  GLUCOSE 143*  BUN 37*  CREATININE 2.44*  CALCIUM 9.5     Intake/Output Summary (Last 24 hours) at 10/18/2023 1610 Last data filed at 10/18/2023 9604 Gross per 24 hour  Intake 598 ml  Output 2100 ml  Net -1502 ml        Physical Exam: Vital Signs Blood pressure 137/64, pulse 84, temperature 98.4 F (36.9 C), temperature source Oral, resp. rate 18, height 5\' 5"  (1.651 m), weight 79.8 kg, SpO2 100%.   General: No acute distress, in bed eating breakfast Mood and affect are appropriate Heart: Regular rate and rhythm no rubs murmurs or extra sounds Lungs: Clear to auscultation, breathing unlabored, no rales or wheezes Abdomen: Positive bowel sounds, soft nontender to palpation, nondistended Extremities: No clubbing, cyanosis, or edema. B/L BKAs.  Skin: No evidence of breakdown, no evidence of rash  PRIOR EXAMS: + aphasia, comprehension improving, can remember d/c date  + Apraxic and right inattention.  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 RLE 4/5 HF, 4- KE/KF LUE and LLE grossly antigravity and against resistance, 5 out of 5   Musculoskeletal: residual limbs well shaped.  Bilateral BKA   Assessment/Plan: 1. Functional deficits which require 3+ hours per day of interdisciplinary therapy in a comprehensive inpatient rehab setting. Physiatrist is providing close team supervision and 24 hour management of active medical problems listed below. Physiatrist and rehab team continue to assess barriers to discharge/monitor patient progress toward  functional and medical goals  Care Tool:  Bathing    Body parts bathed by patient: Right arm, Chest, Abdomen, Front perineal area, Buttocks, Right upper leg, Left upper leg, Face   Body parts bathed by helper: Left arm Body parts n/a: Left lower leg, Right lower leg   Bathing assist Assist Level: Minimal Assistance - Patient > 75%     Upper Body Dressing/Undressing Upper body dressing   What is the patient wearing?: Pull over shirt    Upper body assist Assist Level: Contact Guard/Touching assist    Lower Body Dressing/Undressing Lower body dressing      What is the patient wearing?: Pants, Underwear/pull up     Lower body assist Assist for lower body dressing: Moderate Assistance - Patient 50 - 74%     Toileting Toileting    Toileting assist Assist for toileting: Maximal Assistance - Patient 25 - 49%     Transfers Chair/bed transfer  Transfers assist  Chair/bed transfer activity did not occur: Safety/medical concerns  Chair/bed transfer assist level: Minimal Assistance - Patient > 75%     Locomotion Ambulation   Ambulation assist   Ambulation activity did not occur: Safety/medical concerns  Assist level: Moderate Assistance - Patient  50 - 74% Assistive device: Prothesis Max distance: 54ft   Walk 10 feet activity   Assist  Walk 10 feet activity did not occur: Safety/medical concerns  Assist level: Moderate Assistance - Patient - 50 - 74% Assistive device: Walker-hemi, Prothesis   Walk 50 feet activity   Assist Walk 50 feet with 2 turns activity did not occur: Safety/medical concerns  Assist level: Moderate Assistance - Patient - 50 - 74% Assistive device: Walker-hemi, Prothesis    Walk 150 feet activity   Assist Walk 150 feet activity did not occur: Safety/medical concerns         Walk 10 feet on uneven surface  activity   Assist Walk 10 feet on uneven surfaces activity did not occur: Safety/medical concerns          Wheelchair     Assist Is the patient using a wheelchair?: Yes Type of Wheelchair: Manual    Wheelchair assist level: Supervision/Verbal cueing Max wheelchair distance: 100    Wheelchair 50 feet with 2 turns activity    Assist        Assist Level: Supervision/Verbal cueing   Wheelchair 150 feet activity     Assist      Assist Level: Minimal Assistance - Patient > 75%   Blood pressure 137/64, pulse 84, temperature 98.4 F (36.9 C), temperature source Oral, resp. rate 18, height 5\' 5"  (1.651 m), weight 79.8 kg, SpO2 100%.  Medical Problem List and Plan: 1. 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 Formulized individual treatment plan based on medical history, underlying problem and comorbidities.  2.  Antithrombotics: ASA 81mg  resumed per neuro recs , recheck CT head on 10/16 improved will resume plavix per Dr Roda Shutters, neuro recs -DVT/anticoagulation:   -antiplatelet therapy: Aspirin and Plavix for three months followed by aspirin alone (started 9/26)- See above    3. Pain Management: Tylenol as needed   4. Mood/Behavior/Sleep: LCSW to evaluate and provide emotional support             -antipsychotic agents: n/a   -Melatonin 5mg  at bedtime  5. Neuropsych/cognition: This patient is not quite capable of making decisions on his own behalf.   -language improving. -may have capacity soon.   6. Skin/Wound Care: Routine skin care checks -Discussed with nursing good skin check of bilateral residual limbs once in bed at rehab   7. Fluids/Electrolytes/Nutrition: strict Is and Os and follow-up chemistries   8: Hypertension: monitor TID and prn             -continue hydralazine 50 mg  BID             -Increase carvedilol to 12.5 mg BID on 10/21 monitor effect on HR and BP              -continue terazosin 4 mg q HS   -10/7: Increase Hydralazine to 50 mg TID  Vitals:   10/14/23 0532 10/14/23 1436 10/14/23 2104 10/15/23 0525  BP: (!) 166/81 (!) 167/78 (!) 158/76 (!) 159/75   10/15/23 1330 10/15/23 1957 10/16/23 0548 10/16/23 1512  BP: (!) 142/76 (!) 160/70 (!) 148/80 (!) 174/82   10/16/23 1940 10/17/23 0438 10/17/23 1351 10/17/23 2017  BP: (!) 142/72 [name])  156/78 (!) 143/71 137/64   No BP changes prior to d/c in am   9: Hyperlipidemia: continue rosuvastatin 20mg  daily   10: 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 Renal signed off -myfortic restarted per nephro 10/15           Creat trending up will ask nephro to follow - appreciate nephro reconsult       Latest Ref Rng & Units 10/16/2023    5:40 AM 10/15/2023    7:13 AM 10/14/2023    7:22 AM  BMP  Glucose 70 - 99 mg/dL 962  952    BUN 6 - 20 mg/dL 37  35    Creatinine 8.41 - 1.24 mg/dL 3.24  4.01    Sodium 027 - 145 mmol/L 137  138    Potassium 3.5 - 5.1 mmol/L 4.3  4.5  4.9   Chloride 98 - 111 mmol/L 107  110    CO2 22 - 32 mmol/L 19  20    Calcium 8.9 - 10.3 mg/dL 9.5  8.9    Per nephro mild elevation due to reduced fluid intake   13: GERD: continue protonix 40mg  BID   14: DM s/p pancreas transplant: CBGs QID; A1c = 5.8% (no home meds listed)             -discontinue SSI             -started carb modified diet- pt refuses now on reg diet  (same as home)    - well controlled, continue current regimen 10/10  15: Nausea: resolved -discontinue Reglan -continue PPI -Zofran prn   15: UTI: E. coli              -completed abx--observe   16: Urinary retention: Resolved             -10/6-continue PVRs thru today perhaps, in and out cath PRN   -pvr's all lesss than 160cc now, continent  -eob to void when possible  -terazosin now at 4 mg at bedtime               17: Hypokalemia: repleted; follow-up BMP -10-8 resolved with daily supplementation daily   18.  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

## 2023-10-18 NOTE — Progress Notes (Signed)
Inpatient Rehabilitation Care Coordinator Discharge Note   Patient Details  Name: [name] MRN: [id number] Date of Birth: 16-Nov-1976   Discharge location: Home  Length of Stay: 20 Days  Discharge activity level: Sup/Min A  Home/community participation: Spouse and family  Patient response YQ:MVHQIO Literacy - How often do you need to have someone help you when you read instructions, pamphlets, or other written material from your doctor or pharmacy?: Often  Patient response NG:EXBMWU Isolation - How often do you feel lonely or isolated from those around you?: Never  Services provided included: SW, Neuropsych, Pharmacy, TR, CM, RN, SLP, OT, PT, RD, MD  Financial Services:  Financial Services Utilized: Private Insurance [facility]  Choices offered to/list presented to: spouse and patient  Follow-up services arranged:  DME, Home Health Home Health Agency: [facility] OT SLP    DME : HB. RW and BSC    Patient response to transportation need: Is the patient able to respond to transportation needs?: Yes In the past 12 months, has lack of transportation kept you from medical appointments or from getting medications?: No In the past 12 months, has lack of transportation kept you from meetings, work, or from getting things needed for daily living?: No   Patient/Family verbalized understanding of follow-up arrangements:  Yes  Individual responsible for coordination of the follow-up plan: spouse  Confirmed correct DME delivered: Andria Rhein 10/18/2023    Comments (or additional information):  Summary of Stay    Date/Time Discharge Planning CSW  10/17/23 1339 Discharging home with spouse on 10/24. HH eslblished with [facility]. DME ordered. No barriers. CJB  10/10/23 1524 Disharging home with spouse. Family plans to assist with supervison while spouse works. 2 level home, 4 steps. 1/2 bath on main. CJB  10/03/23 1234 Patient discharging home with spouse. Spouse  works as Management consultant at [employer] (plans to take [employer] or [facility]). 2 level home, 4 steps to enter. 1/2 bath on main level CJB       Andria Rhein

## 2023-10-18 NOTE — Progress Notes (Signed)
Speech Language Pathology Discharge Summary  Patient Details  Name: [name] MRN: [id number] Date of Birth: Jul 24, 1976  Date of Discharge from SLP service:October 18, 2023  Today's Date: 10/18/2023 SLP Individual Time: 1400-1500 SLP Individual Time Calculation (min): 60 min   Skilled Therapeutic Interventions:   Skilled therapy session focused on communicative and cognitive goals. SLP facilitated session by providing minA during confrontational naming task. Patient named 9/10 functional items independently and required x1 min phonemic cue. To continue to address communication, SLP provided supervision A during 2 step command following task. Prior to today's session, patient continued to require modA to follow two step functional and abstract commands, though during this session completed with limited-no cues. To address cognitive skills, SLP provided supervision A during functional problem solving scenarios. Patient accurately responded to questions regarding what he would do in specific safety scenarios and named important information needed in an emergency (name, address, age). Throughout today's session, patient demonstrated significant progress in awareness as he self corrected phonemic and word errors throughout. Patient left in [facility] with call bell in reach.     Patient has met 6 of 6 long term goals.  Patient to discharge at overall Modified Independent;Min;Supervision level.   Clinical Impression/Discharge Summary:  Pt has made excellent gains and has met 6 of 6 LTG's this admission due to improved communication and cognition. Pt is currently an overall supervision A for cognitive tasks including basic problem solving and awareness of deficits/mistakes. Patient is able to express basic wants/needs through modes of multimodal communication given modiA. Patient continues to benefit from occasional min phonemic cues during word finding tasks, though has made significant improvements  since admission. Patient demonstrated gains in auditory comprehension this date (10/23) seen through only requiring supervision A to answer yes/no questions and follow 2 step commands.  Pt/family education complete and pt will discharge home with 24 hour supervision from friends/family/etc. Pt would benefit from OP f/u ST services to maximize communication and cognition in order to maximize functional independence.    Care Partner:  Caregiver Able to Provide Assistance: Yes  Type of Caregiver Assistance: Physical;Cognitive  Recommendation:  Outpatient SLP;24 hour supervision/assistance  Rationale for SLP Follow Up: Maximize functional communication   Equipment: n/a   Reasons for discharge: Discharged from hospital;Treatment goals met   Patient/Family Agrees with Progress Made and Goals Achieved: Yes    Amila Callies M.A., CF-SLP 10/18/2023, 3:38 PM

## 2023-10-18 NOTE — Progress Notes (Signed)
Occupational Therapy Session Note  Patient Details  Name: [name] MRN: [id number] Date of Birth: [birth year]  Today's Date: 10/18/2023 OT Individual Time: 0950-1050 OT Individual Time Calculation (min): 60 min    Short Term Goals: Week 3:  OT Short Term Goal 1 (Week 3): STGs = LTGs  Skilled Therapeutic Interventions/Progress Updates:  Pt greeted seated in w/c, pt agreeable to OT intervention.  Completed required DC assessments as indicated below    Transfers/bed mobility: pt completed sit>stand from EOM with MIN A with RW with R saddle splint. Pt completed stand pivot transfer to mat table with RW and MINA.   NMR:  Pt dynamic standing balnace task with pt instructed to stand from mat table with RW, step to marker on floor with RLE for NMR and to work on swing during functional mobility.pt then instructed to retrieve bean bag with LUE and then toss to target in order to facilitate WB'ing through RUE. , pt needed mIN A to swign RLE through even with toe cap. Provided MIN A for balance on R side. Pt completed task 2x with increased fatigue post activity.   Provided education on RUE HEP to decrease spasticity/tone for home.      Ended session with pt seated in w/c with all needs within reach.   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), R hemi, avoid hypotension Restrictions Weight Bearing Restrictions: No    Pain: no pain reported   Vision Baseline Vision/History: 1 Wears glasses Patient Visual Report: No change from baseline Vision Assessment?: Yes Eye Alignment: Within Functional Limits Ocular Range of Motion: Within Functional Limits Tracking/Visual Pursuits: Able to track stimulus in all quads without difficulty Saccades: Additional head turns occurred during testing Convergence: Impaired (comment) (feel more related to cognition i.e aphasia) Visual Fields: No apparent  deficits Perception  Perception: Within Functional Limits Praxis Praxis: WFL Cognition Brief Interview for Mental Status (BIMS) Repetition of Three Words (First Attempt): 3 Temporal Orientation: Year: Correct (with visual aid provided on 2 options) Temporal Orientation: Month: Accurate within 5 days (with 2 options provided from visual aid) Temporal Orientation: Day: Correct (with 2 options from visual aid provided) Recall: "Sock": No, could not recall Recall: "Blue": Yes, no cue required Recall: "Bed": No, could not recall BIMS Summary Score: 11 Sensation Sensation Light Touch: Appears Intact Hot/Cold: Not tested Proprioception: Appears Intact Stereognosis: Not tested Coordination Gross Motor Movements are Fluid and Coordinated: No Fine Motor Movements are Fluid and Coordinated: No Motor  Motor Motor: Hemiplegia Motor - Discharge Observations: R hemi however improved from eval Mobility  Bed Mobility Bed Mobility: Supine to Sit;Sit to Supine Supine to Sit: Supervision/Verbal cueing Sit to Supine: Supervision/Verbal cueing Transfers Sit to Stand: Minimal Assistance - Patient > 75% Stand to Sit: Minimal Assistance - Patient > 75%  Trunk/Postural Assessment  Cervical Assessment Cervical Assessment: Within Functional Limits Thoracic Assessment Thoracic Assessment: Within Functional Limits Lumbar Assessment Lumbar Assessment: Within Functional Limits Postural Control Postural Control: Within Functional Limits  Balance Standardized Balance Assessment Standardized Balance Assessment: PASS Postural Assessment Scale for Stroke Patients=PASS 1. Sitting Without Support: Can sit for 5 minutes without support 2. Standing With Support: Can stand with moderate support of 1 person 3. Standing Without Support: Cannot stand without support 4.Standing on Nonparetic Leg: Cannot stand on nonparetic leg 5.Standing on Paretic Leg: Cannot stand on paretic leg MAINTAINING POSTURE  SUBTOTAL: 5 6. Supine to Paretic Side Lateral: Can perform without help 7.  Supine to Nonparetic Side Lateral: Can perform with little help 8. Supine to Sitting Up on the Edge of the Mat: Can perform with much help 9. Sitting on the Edge of the Mat to Supine: Can perform with much help 10. Sitting to Standing Up: Can perform with much help 11. Standing Up to Sitting Down: Can perform with little help 12. Standing,Picking Up a Pencil from the Floor: (P) Can perform with much help CHANGING POSTURE SUBTOTAL: (P) 11 PASS TOTAL SCORE: (P) 16 Static Sitting Balance Static Sitting - Balance Support: No upper extremity supported Static Sitting - Level of Assistance: 5: Stand by assistance Dynamic Sitting Balance Dynamic Sitting - Balance Support: During functional activity Dynamic Sitting - Level of Assistance: 4: Min assist Static Standing Balance Static Standing - Balance Support: Bilateral upper extremity supported Static Standing - Level of Assistance: 5: Stand by assistance Dynamic Standing Balance Dynamic Standing - Balance Support: Bilateral upper extremity supported Dynamic Standing - Level of Assistance: 4: Min assist Extremity/Trunk Assessment RUE Assessment RUE Assessment: Exceptions to [facility] Passive Range of Motion (PROM) Comments: WFL RUE Body System: Neuro Brunstrum levels for arm and hand: Arm;Hand Brunstrum level for arm: Stage IV Movement is deviating from synergy Brunstrum level for hand: Stage VI Isolated joint movements LUE Assessment LUE Assessment: Within Functional Limits  Therapy/Group: Individual Therapy  [name] 10/18/2023, 12:23 PM

## 2023-10-18 NOTE — Progress Notes (Signed)
Patient ID: [name], male   DOB: 10-07-1976, 47 y.o.   MRN: [id number]  Team Conference Report to Patient/Family  Team Conference discussion was reviewed with the patient and caregiver, including goals, any changes in plan of care and target discharge date.  Patient and caregiver express understanding and are in agreement.  The patient has a target discharge date of 10/19/23.   Sw met with patient and called spouse via telephone to inform them of medical stability for d/c tomorrow. Spouse expressed due to her work schedule she anticipates to pick patient up at 9 AM.  Spouse will have family sit with patient while she is working. The ramp at pt's home will be completed by Saturday. Family will assist with carrying WC in the home on tomorrow. No additional questions or concerns.  Andria Rhein 10/18/2023, 1:39 PM

## 2023-10-18 NOTE — Plan of Care (Signed)
  Problem: RH SAFETY Goal: RH STG ADHERE TO SAFETY PRECAUTIONS W/ASSISTANCE/DEVICE Description: STG Adhere to Safety Precautions With cues Assistance/Device. Outcome: Progressing; patient calls appropriately

## 2023-10-18 NOTE — Progress Notes (Signed)
Occupational Therapy Discharge Summary  Patient Details  Name: [name] MRN: [id number] Date of Birth: 06/06/1976  Date of Discharge from OT service:October 18, 2023   Patient has met 10 of 10 long term goals due to improved balance, postural control, ability to compensate for deficits, functional use of  RIGHT upper and RIGHT lower extremity, improved attention, improved awareness, and improved coordination.  Patient to discharge at [facility] Assist level.  Patient's care partner is independent to provide the necessary physical and cognitive assistance at discharge.  Family education with his spouse has been completed.   Pt has made significant progress as he was max A on admission and is now min A.  On admission, RUE flaccid and today he is able to flex elbow and actively use wrist and fingers.   Reasons goals not met: n/a  Recommendation:  Patient will benefit from ongoing skilled OT services in home health setting to continue to advance functional skills in the area of BADL and iADL. Pt would also benefit from outpt OT and can hopefully graduate to that level soon.    Equipment: BSC  Reasons for discharge: treatment goals met  Patient/family agrees with progress made and goals achieved: Yes  OT Discharge Precautions/Restrictions   fall ADL ADL Eating: Set up Grooming: Setup Where Assessed-Grooming: Sitting at sink Upper Body Bathing: Minimal assistance Where Assessed-Upper Body Bathing: Shower Lower Body Bathing: Contact guard Where Assessed-Lower Body Bathing: Shower Upper Body Dressing: Setup Where Assessed-Upper Body Dressing: Wheelchair Lower Body Dressing: Moderate assistance Where Assessed-Lower Body Dressing: Wheelchair Toileting: Moderate assistance Where Assessed-Toileting: Bedside Commode Toilet Transfer: Minimal assistance Toilet Transfer Method: Stand pivot Toilet Transfer Equipment: Psychiatric nurse: Minimal  assistance Film/video editor Method: Administrator: Emergency planning/management officer, Grab bars Vision Baseline Vision/History: 1 Wears glasses Patient Visual Report: No change from baseline Vision Assessment?: Yes Eye Alignment: Within Functional Limits Ocular Range of Motion: Within Functional Limits Tracking/Visual Pursuits: Able to track stimulus in all quads without difficulty Saccades: Additional head turns occurred during testing Convergence: Impaired (comment) (feel more related to cognition i.e aphasia) Visual Fields: No apparent deficits Perception  Perception: Within Functional Limits Praxis Praxis: WFL Cognition Brief Interview for Mental Status (BIMS) Repetition of Three Words (First Attempt): 3 Temporal Orientation: Year: Correct (with visual aid provided on 2 options) Temporal Orientation: Month: Accurate within 5 days (with 2 options provided from visual aid) Temporal Orientation: Day: Correct (with 2 options from visual aid provided) Recall: "Sock": No, could not recall Recall: "Blue": Yes, no cue required Recall: "Bed": No, could not recall BIMS Summary Score: 11 Sensation Sensation Light Touch: Appears Intact Hot/Cold: Not tested Proprioception: Appears Intact Stereognosis: Not tested Coordination Gross Motor Movements are Fluid and Coordinated: No Fine Motor Movements are Fluid and Coordinated: No Motor  Motor Motor: Hemiplegia Motor - Discharge Observations: R hemi however improved from eval Mobility  Bed Mobility Bed Mobility: Supine to Sit;Sit to Supine Supine to Sit: Supervision/Verbal cueing Sit to Supine: Supervision/Verbal cueing Transfers Sit to Stand: Minimal Assistance - Patient > 75% Stand to Sit: Minimal Assistance - Patient > 75%  Trunk/Postural Assessment  Cervical Assessment Cervical Assessment: Within Functional Limits Thoracic Assessment Thoracic Assessment: Within Functional Limits Lumbar Assessment Lumbar  Assessment: Within Functional Limits Postural Control Postural Control: Within Functional Limits  Balance Standardized Balance Assessment Standardized Balance Assessment: PASS Postural Assessment Scale for Stroke Patients=PASS 1. Sitting Without Support: Can sit for 5 minutes without support 2. Standing With  Support: Can stand with moderate support of 1 person 3. Standing Without Support: Cannot stand without support 4.Standing on Nonparetic Leg: Cannot stand on nonparetic leg 5.Standing on Paretic Leg: Cannot stand on paretic leg MAINTAINING POSTURE SUBTOTAL: 5 6. Supine to Paretic Side Lateral: Can perform without help 7. Supine to Nonparetic Side Lateral: Can perform with little help 8. Supine to Sitting Up on the Edge of the Mat: Can perform with much help 9. Sitting on the Edge of the Mat to Supine: Can perform with much help 10. Sitting to Standing Up: Can perform with much help 11. Standing Up to Sitting Down: Can perform with little help 12. Standing,Picking Up a Pencil from the Floor: (P) Can perform with much help CHANGING POSTURE SUBTOTAL: (P) 11 PASS TOTAL SCORE: (P) 16 Static Sitting Balance Static Sitting - Balance Support: No upper extremity supported Static Sitting - Level of Assistance: 5: Stand by assistance Dynamic Sitting Balance Dynamic Sitting - Balance Support: During functional activity Dynamic Sitting - Level of Assistance: 4: Min assist Static Standing Balance Static Standing - Balance Support: Bilateral upper extremity supported Static Standing - Level of Assistance: 5: Stand by assistance Dynamic Standing Balance Dynamic Standing - Balance Support: Bilateral upper extremity supported Dynamic Standing - Level of Assistance: 4: Min assist Extremity/Trunk Assessment RUE Assessment RUE Assessment: Exceptions to [facility] Passive Range of Motion (PROM) Comments: WFL RUE Body System: Neuro Brunstrum levels for arm and hand: Arm;Hand Brunstrum level for arm:  Stage IV Movement is deviating from synergy Brunstrum level for hand: Stage VI Isolated joint movements LUE Assessment LUE Assessment: Within Functional Limits   Brayleigh Rybacki 10/18/2023, 12:38 PM

## 2023-10-18 NOTE — Plan of Care (Signed)
  Problem: RH Comprehension Communication Goal: LTG Patient will comprehend basic/complex auditory (SLP) Description: LTG: Patient will comprehend basic/complex auditory information with cues (SLP). Outcome: Completed/Met   Problem: RH Expression Communication Goal: LTG Patient will express needs/wants via multi-modal(SLP) Description: LTG:  Patient will express needs/wants via multi-modal communication (gestures/written, etc) with cues (SLP) Outcome: Completed/Met Goal: LTG Patient will verbally express basic/complex needs(SLP) Description: LTG:  Patient will verbally express basic/complex needs, wants or ideas with cues  (SLP) Outcome: Completed/Met Goal: LTG Patient will increase word finding of common (SLP) Description: LTG:  Patient will increase word finding of common objects/daily info/abstract thoughts with cues using compensatory strategies (SLP). Outcome: Completed/Met   Problem: RH Problem Solving Goal: LTG Patient will demonstrate problem solving for (SLP) Description: LTG:  Patient will demonstrate problem solving for basic/complex daily situations with cues  (SLP) Outcome: Completed/Met   Problem: RH Awareness Goal: LTG: Patient will demonstrate awareness during functional activites type of (SLP) Description: LTG: Patient will demonstrate awareness during functional activites type of (SLP) Outcome: Completed/Met

## 2023-10-18 NOTE — Progress Notes (Signed)
Physical Therapy Discharge Summary  Patient Details  Name: [name] MRN: [id number] Date of Birth: [birth year]  Date of Discharge from PT service:October 18, 2023  Today's Date: 10/18/2023 PT Individual Time: 1100-1202 PT Individual Time Calculation (min): 62 min    Patient has met {NUMBERS [id number]} of 10 long term goals due to improved activity tolerance, improved balance, improved postural control, increased strength, ability to compensate for deficits, and improved attention.  Patient to discharge at a wheelchair level Supervision.   Patient's care partner is independent to provide the necessary physical and cognitive assistance at discharge.  Reasons goals not met: ***  Recommendation:  Patient will benefit from ongoing skilled PT services in home health setting to continue to advance safe functional mobility, address ongoing impairments in strength, coordination, balance, activity tolerance, cognition, safety awareness, and to minimize fall risk.  Equipment: RW with R hand saddle splint, custom lightweight [id number]  Reasons for discharge: treatment goals met and discharge from hospital  Patient/family agrees with progress made and goals achieved: Yes  PT Discharge Precautions/Restrictions   Pain   Pain Interference   Vision/Perception     Cognition   Sensation   Motor     Mobility Bed Mobility Bed Mobility: Supine to Sit;Sit to Supine Supine to Sit: Supervision/Verbal cueing Sit to Supine: Supervision/Verbal cueing Transfers Transfers: Sit to Stand;Stand to Sit;Stand Pivot Transfers;Squat Pivot Transfers Sit to Stand: Minimal Assistance - Patient > 75% Stand to Sit: Minimal Assistance - Patient > 75% Stand Pivot Transfers: Minimal Assistance - Patient > 75% Stand Pivot Transfer Details: Verbal cues for technique;Verbal cues for gait pattern;Verbal cues for safe use of DME/AE Squat Pivot Transfers: Contact Guard/Touching assist Transfer (Assistive  device): Rolling walker Locomotion     Trunk/Postural Assessment     Balance Standardized Balance Assessment Standardized Balance Assessment: PASS Postural Assessment Scale for Stroke Patients=PASS 1. Sitting Without Support: Can sit for 5 minutes without support 2. Standing With Support: Can stand with moderate support of 1 person 3. Standing Without Support: Cannot stand without support 4.Standing on Nonparetic Leg: Cannot stand on nonparetic leg 5.Standing on Paretic Leg: Cannot stand on paretic leg MAINTAINING POSTURE SUBTOTAL: 5 6. Supine to Paretic Side Lateral: Can perform without help 7. Supine to Nonparetic Side Lateral: Can perform with little help 8. Supine to Sitting Up on the Edge of the Mat: Can perform with much help 9. Sitting on the Edge of the Mat to Supine: Can perform with much help 10. Sitting to Standing Up: Can perform with much help 11. Standing Up to Sitting Down: Can perform with little help 12. Standing,Picking Up a Pencil from the Floor: (P) Can perform with much help CHANGING POSTURE SUBTOTAL: (P) 11 PASS TOTAL SCORE: (P) 16 Static Sitting Balance Static Sitting - Balance Support: No upper extremity supported Static Sitting - Level of Assistance: 5: Stand by assistance Dynamic Sitting Balance Dynamic Sitting - Balance Support: During functional activity Dynamic Sitting - Level of Assistance: 4: Min assist Static Standing Balance Static Standing - Balance Support: Bilateral upper extremity supported Static Standing - Level of Assistance: 5: Stand by assistance Dynamic Standing Balance Dynamic Standing - Balance Support: Bilateral upper extremity supported Dynamic Standing - Level of Assistance: 4: Min assist Extremity Assessment            Loel Dubonnet PT, DPT, CSRS 10/18/2023, 2:48 PM

## 2023-10-19 DIAGNOSIS — I639 Cerebral infarction, unspecified: Secondary | ICD-10-CM | POA: Diagnosis not present

## 2023-10-19 NOTE — Progress Notes (Signed)
Inpatient Rehabilitation Discharge Medication Review by a Pharmacist  A complete drug regimen review was completed for this patient to identify any potential clinically significant medication issues.  High Risk Drug Classes Is patient taking? Indication by Medication  Antipsychotic No   Anticoagulant No   Antibiotic Yes Bactrim - PJP ppx due to IS  Opioid No   Antiplatelet Yes Aspirin, clopidogrel - stroke ppx  Hypoglycemics/insulin No   Vasoactive Medication Yes Coreg, hydralazine - HTN Terazosin - urinary retention, HTN  Chemotherapy No   Other Yes Methocarbamol - muscle spasms Kcl - supplement Senokot-S, Miralax - constipation Melatonin - sleep Cyclosporine, mycophenolate, prednisone - IS for transplant Pantoprazole - GERD ppx Rosuvastin - HLD     Type of Medication Issue Identified Description of Issue Recommendation(s)  Drug Interaction(s) (clinically significant)     Duplicate Therapy     Allergy     No Medication Administration End Date     Incorrect Dose     Additional Drug Therapy Needed     Significant med changes from prior encounter (inform family/care partners about these prior to discharge). Nulojix replaced with cyclosporine + mycophenolate  Meclizine, Reglan, Vit D stopped at discharge Communicate medication changes with patient/family at discharge  Other       Clinically significant medication issues were identified that warrant physician communication and completion of prescribed/recommended actions by midnight of the next day:  No   Pharmacist comments:  DAPT x3 months per neurology recommendations   Time spent performing this drug regimen review (minutes): 20   Thank you for allowing pharmacy to be a part of this patient's care.  Thelma Barge, PharmD Clinical Pharmacist

## 2023-10-19 NOTE — Plan of Care (Signed)
  Problem: RH Balance Goal: LTG Patient will maintain dynamic sitting balance (PT) Description: LTG:  Patient will maintain dynamic sitting balance with assistance during mobility activities (PT) Outcome: Adequate for Discharge Flowsheets (Taken 10/18/2023 1753) LTG: Pt will maintain dynamic sitting balance during mobility activities with:: Supervision/Verbal cueing Goal: LTG Patient will maintain dynamic standing balance (PT) Description: LTG:  Patient will maintain dynamic standing balance with assistance during mobility activities (PT) Outcome: Adequate for Discharge Flowsheets (Taken 10/18/2023 1753) LTG: Pt will maintain dynamic standing balance during mobility activities with:: Minimal Assistance - Patient > 75%   Problem: RH Wheelchair Mobility Goal: LTG Patient will propel w/c in controlled environment (PT) Description: LTG: Patient will propel wheelchair in controlled environment, # of feet with assist (PT) Outcome: Adequate for Discharge Flowsheets (Taken 10/18/2023 1753) LTG: Pt will propel w/c in controlled environ  assist needed:: Supervision/Verbal cueing   Problem: Sit to Stand Goal: LTG:  Patient will perform sit to stand with assistance level (PT) Description: LTG:  Patient will perform sit to stand with assistance level (PT) Outcome: Completed/Met Flowsheets (Taken 09/30/2023 1519 by Edwin Cap, PT) LTG: PT will perform sit to stand in preparation for functional mobility with assistance level: Contact Guard/Touching assist   Problem: RH Bed Mobility Goal: LTG Patient will perform bed mobility with assist (PT) Description: LTG: Patient will perform bed mobility with assistance, with/without cues (PT). Outcome: Completed/Met Flowsheets (Taken 09/30/2023 1519 by Edwin Cap, PT) LTG: Pt will perform bed mobility with assistance level of: Supervision/Verbal cueing   Problem: RH Bed to Chair Transfers Goal: LTG Patient will perform bed/chair transfers w/assist  (PT) Description: LTG: Patient will perform bed to chair transfers with assistance (PT). Outcome: Completed/Met Flowsheets (Taken 10/18/2023 1753) LTG: Pt will perform Bed to Chair Transfers with assistance level: Supervision/Verbal cueing   Problem: RH Ambulation Goal: LTG Patient will ambulate in controlled environment (PT) Description: LTG: Patient will ambulate in a controlled environment, # of feet with assistance (PT). Outcome: Completed/Met Flowsheets (Taken 09/30/2023 1519 by Edwin Cap, PT) LTG: Pt will ambulate in controlled environ  assist needed:: Minimal Assistance - Patient > 75% LTG: Ambulation distance in controlled environment: 150' Goal: LTG Patient will ambulate in home environment (PT) Description: LTG: Patient will ambulate in home environment, # of feet with assistance (PT). Outcome: Completed/Met Flowsheets (Taken 09/30/2023 1519 by Edwin Cap, PT) LTG: Pt will ambulate in home environ  assist needed:: Minimal Assistance - Patient > 75% LTG: Ambulation distance in home environment: 50'   Problem: RH Stairs Goal: LTG Patient will ambulate up and down stairs w/assist (PT) Description: LTG: Patient will ambulate up and down # of stairs with assistance (PT) Outcome: Completed/Met Flowsheets Taken 10/18/2023 1753 by Sharol Harness A, PT LTG: Pt will  ambulate up and down number of stairs: 8 steps using 1 HR and 1 HHA Taken 09/30/2023 1531 by Edwin Cap, PT LTG: Pt will ambulate up/down stairs assist needed:: Minimal Assistance - Patient > 75%

## 2023-10-19 NOTE — Progress Notes (Signed)
PROGRESS NOTE   Subjective/Complaints:  No new issues   ROS: Limited by aphasia   Objective:   No results found. No results for input(s): "WBC", "HGB", "HCT", "PLT" in the last 72 hours.    No results for input(s): "NA", "K", "CL", "CO2", "GLUCOSE", "BUN", "CREATININE", "CALCIUM" in the last 72 hours.    Intake/Output Summary (Last 24 hours) at 10/19/2023 0743 Last data filed at 10/18/2023 1255 Gross per 24 hour  Intake 718 ml  Output 1100 ml  Net -382 ml        Physical Exam: Vital Signs Blood pressure 133/71, pulse 74, temperature 98 F (36.7 C), temperature source Oral, resp. rate 16, height 5\' 5"  (1.651 m), weight 79.8 kg, SpO2 99%.   General: No acute distress, in bed eating breakfast Mood and affect are appropriate Heart: Regular rate and rhythm no rubs murmurs or extra sounds Lungs: Clear to auscultation, breathing unlabored, no rales or wheezes Abdomen: Positive bowel sounds, soft nontender to palpation, nondistended Extremities: No clubbing, cyanosis, or edema. B/L BKAs.  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 RLE 4/5 HF, 4- KE/KF LUE and LLE grossly antigravity and against resistance, 5 out of 5   Musculoskeletal: residual limbs well shaped.  Bilateral BKA   Assessment/Plan: 1. Functional deficits due to Left MCA infarct Stable for D/C today F/u PCP in 3-4 weeks F/u PM&R 2 weeks F/u Neuro 1-2 mo  F/u transplant team  See D/C summary See D/C instructions   Care Tool:  Bathing    Body parts bathed by patient: Right arm, Chest, Abdomen, Front perineal area, Buttocks, Right upper leg, Left upper leg, Face   Body parts bathed by helper: Left arm Body parts n/a: Left lower leg, Right lower leg   Bathing assist Assist Level: Minimal Assistance - Patient > 75%     Upper Body  Dressing/Undressing Upper body dressing   What is the patient wearing?: Pull over shirt    Upper body assist Assist Level: Set up assist    Lower Body Dressing/Undressing Lower body dressing      What is the patient wearing?: Pants, Underwear/pull up     Lower body assist Assist for lower body dressing: Moderate Assistance - Patient 50 - 74%     Toileting Toileting    Toileting assist Assist for toileting: Moderate Assistance - Patient 50 - 74%     Transfers Chair/bed transfer  Transfers assist  Chair/bed transfer activity did not occur: Safety/medical concerns  Chair/bed transfer assist level: Contact Guard/Touching assist     Locomotion Ambulation   Ambulation assist   Ambulation activity did not occur: Safety/medical concerns  Assist level: Moderate Assistance - Patient 50 - 74% Assistive device: Prothesis Max distance: 79ft   Walk 10 feet activity   Assist  Walk 10 feet activity did not occur: Safety/medical concerns  Assist level: Moderate Assistance - Patient - 50 - 74% Assistive device: Walker-hemi, Prothesis   Walk 50 feet activity   Assist Walk 50 feet with 2 turns activity did not occur: Safety/medical concerns  Assist level: Moderate Assistance - Patient - 50 - 74% Assistive device: Walker-hemi, Prothesis    Walk 150 feet activity   Assist Walk 150 feet activity did not occur: Safety/medical concerns         Walk 10 feet on uneven surface  activity   Assist Walk 10 feet on uneven surfaces activity did not occur: Safety/medical concerns         Wheelchair     Assist Is the patient using a wheelchair?: Yes Type of Wheelchair: Manual    Wheelchair assist level: Supervision/Verbal cueing Max wheelchair distance: 100    Wheelchair 50 feet with 2 turns activity    Assist        Assist Level: Supervision/Verbal cueing   Wheelchair 150 feet activity     Assist      Assist Level: Minimal Assistance -  Patient > 75%   Blood pressure 133/71, pulse 74, temperature 98 F (36.7 C), temperature source Oral, resp. rate 16, height 5\' 5"  (1.651 m), weight 79.8 kg, SpO2 99%.  Medical Problem List and Plan: 1. Functional deficits secondary to watershed stroke with subcortical infarction in the left occipital lobe and posterior left frontal lobe              -patient may shower -ELOS/Goals: d/c 10/24, 2.  Antithrombotics: ASA 81mg  resumed per neuro recs , recheck CT head on 10/16 improved will resume plavix per Dr Roda Shutters, neuro recs -DVT/anticoagulation:   -antiplatelet therapy: Aspirin and Plavix for three months followed by aspirin alone (started 9/26)- See above    3. Pain Management: Tylenol as needed   4. Mood/Behavior/Sleep: LCSW to evaluate and provide emotional support             -antipsychotic agents: n/a   -Melatonin 5mg  at bedtime  5. Neuropsych/cognition: This patient is not quite capable of making decisions on his own behalf.   -language improving. -may have capacity soon.   6. Skin/Wound Care: Routine skin care checks -Discussed with nursing good skin check of bilateral residual limbs once in bed at rehab   7. Fluids/Electrolytes/Nutrition: strict Is and Os and follow-up chemistries   8: Hypertension: monitor TID and prn             -continue hydralazine 50 mg BID             -Increase carvedilol to 12.5 mg BID on 10/21 monitor effect on HR and BP              -continue terazosin 4 mg q HS   -10/7: Increase Hydralazine to 50 mg TID  Vitals:   10/15/23 0525 10/15/23 1330 10/15/23 1957 10/16/23 0548  BP: (!) 159/75 (!) 142/76 (!) 160/70 (!) 148/80   10/16/23 1512 10/16/23 1940 10/17/23 0438 10/17/23 1351  BP: (!) 174/82 (!) 142/72 (!) 156/78 (!) 143/71   10/17/23 2017 10/18/23 1313 10/18/23 2018 10/19/23 0445  BP: 137/64 131/66 (!) 159/70 133/71     9: Hyperlipidemia: continue rosuvastatin 20mg  daily   10: 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 Renal signed off -myfortic restarted per nephro 10/15           Creat trending up will ask nephro to follow - appreciate nephro reconsult       Latest Ref Rng & Units 10/16/2023    5:40 AM 10/15/2023    7:13 AM 10/14/2023    7:22 AM  BMP  Glucose 70 - 99 mg/dL 161  096    BUN 6 - 20 mg/dL 37  35    Creatinine 0.45 - 1.24 mg/dL 4.09  8.11    Sodium 914 - 145 mmol/L 137  138    Potassium 3.5 - 5.1 mmol/L 4.3  4.5  4.9   Chloride 98 - 111 mmol/L 107  110    CO2 22 - 32 mmol/L 19  20    Calcium 8.9 - 10.3 mg/dL 9.5  8.9    Per nephro mild elevation due to reduced fluid intake   13: GERD: continue protonix 40mg  BID   14: DM s/p pancreas transplant: CBGs QID; A1c = 5.8% (no home meds listed)             -discontinue SSI             -started carb modified diet- pt refuses now on reg diet (same as home)    - well controlled, continue current regimen 10/10  15: Nausea: resolved -discontinue Reglan -continue PPI -Zofran prn   15: UTI: E. coli              -completed abx--observe   16: Urinary retention: Resolved             -10/6-continue PVRs thru today perhaps, in and out cath PRN   -pvr's all lesss than 160cc now, continent  -eob to void when possible  -terazosin now at 4 mg at bedtime               17: Hypokalemia: repleted; follow-up BMP -10-8 resolved with daily supplementation daily   18.  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

## 2023-10-20 ENCOUNTER — Telehealth: Payer: Self-pay | Admitting: Internal Medicine

## 2023-10-20 DIAGNOSIS — Z9483 Pancreas transplant status: Secondary | ICD-10-CM | POA: Diagnosis not present

## 2023-10-20 DIAGNOSIS — N390 Urinary tract infection, site not specified: Secondary | ICD-10-CM | POA: Diagnosis not present

## 2023-10-20 DIAGNOSIS — I639 Cerebral infarction, unspecified: Secondary | ICD-10-CM | POA: Diagnosis not present

## 2023-10-20 DIAGNOSIS — I69320 Aphasia following cerebral infarction: Secondary | ICD-10-CM | POA: Diagnosis not present

## 2023-10-20 NOTE — Telephone Encounter (Signed)
Arlys John call from [facility] with Lorenda Peck for PT  2 weeks for 8 weeks  Best call back number [phone]

## 2023-10-23 ENCOUNTER — Telehealth: Payer: Self-pay | Admitting: Registered Nurse

## 2023-10-23 DIAGNOSIS — I69320 Aphasia following cerebral infarction: Secondary | ICD-10-CM | POA: Diagnosis not present

## 2023-10-23 DIAGNOSIS — Z9483 Pancreas transplant status: Secondary | ICD-10-CM | POA: Diagnosis not present

## 2023-10-23 DIAGNOSIS — N390 Urinary tract infection, site not specified: Secondary | ICD-10-CM | POA: Diagnosis not present

## 2023-10-23 DIAGNOSIS — I639 Cerebral infarction, unspecified: Secondary | ICD-10-CM | POA: Diagnosis not present

## 2023-10-23 NOTE — Telephone Encounter (Signed)
Transitional Care Call--who you spoke with Josie Hosick (wife returned call)    Are you/is patient experiencing any problems since coming home? No.  Are there any questions regarding any aspect of care? No.  Are there any questions regarding medications administration/dosing? No.  Are meds being taken as prescribed? Yes.   Patient should review meds with caller to confirm. All medications confirmed.  Have there been any falls? No.  Has Home Health been to the house and/or have they contacted you? Yes. They came out on Friday.  If not, have you tried to contact them? No problem. Can we help you contact them? N/A  Are bowels and bladder emptying properly? Yes. Are there any unexpected incontinence issues? No.   If applicable, is patient following bowel/bladder programs? No problem. Any fevers, problems with breathing, unexpected pain? No Are there any skin problems or new areas of breakdown? No. Has the patient/family member arranged specialty MD follow up (ie cardiology/neurology/renal/surgical/etc)? Yes.   Can we help arrange? Per wife appointment with PCP has been scheduled. Other appointments  will be scheduled.  Does the patient need any other services or support that we can help arrange? None needed. Wife advised to call back if assistance is needed. She will ask Home Health nurse if he qualifies for an Nurse Aide. If needed , Dr. Wynn Banker can place the order.   Are caregivers following through as expected in assisting the patient? Yes.         11. Has the patient quit smoking, drinking alcohol, or using drugs as recommended? Caller advised.    Appointment  [address]

## 2023-10-23 NOTE — Telephone Encounter (Signed)
Verbal orders given  

## 2023-10-23 NOTE — Telephone Encounter (Signed)
Is it okay for me to give the orders ?

## 2023-10-23 NOTE — Telephone Encounter (Signed)
Transition Care Management Unsuccessful Follow-up Telephone Call  Date of discharge and from where:  10/19/2023 from [facility]   Attempts:  2nd Attempt  Reason for unsuccessful TCM follow-up call:  Left voice message  Placed a call to Ms. Whang cell phone and house number, no answer. Left message to return the call.

## 2023-10-24 ENCOUNTER — Other Ambulatory Visit: Payer: Self-pay

## 2023-10-24 DIAGNOSIS — N390 Urinary tract infection, site not specified: Secondary | ICD-10-CM | POA: Diagnosis not present

## 2023-10-24 DIAGNOSIS — I639 Cerebral infarction, unspecified: Secondary | ICD-10-CM | POA: Diagnosis not present

## 2023-10-24 DIAGNOSIS — I69320 Aphasia following cerebral infarction: Secondary | ICD-10-CM | POA: Diagnosis not present

## 2023-10-24 DIAGNOSIS — Z9483 Pancreas transplant status: Secondary | ICD-10-CM | POA: Diagnosis not present

## 2023-10-25 ENCOUNTER — Encounter: Payer: Self-pay | Admitting: Internal Medicine

## 2023-10-25 ENCOUNTER — Ambulatory Visit: Payer: 59 | Admitting: Internal Medicine

## 2023-10-25 VITALS — BP 136/78 | HR 97 | Temp 98.8°F | Resp 16 | Ht 65.0 in

## 2023-10-25 DIAGNOSIS — E118 Type 2 diabetes mellitus with unspecified complications: Secondary | ICD-10-CM | POA: Diagnosis not present

## 2023-10-25 DIAGNOSIS — I151 Hypertension secondary to other renal disorders: Secondary | ICD-10-CM | POA: Diagnosis not present

## 2023-10-25 DIAGNOSIS — H6123 Impacted cerumen, bilateral: Secondary | ICD-10-CM | POA: Insufficient documentation

## 2023-10-25 NOTE — Patient Instructions (Signed)
Hypertension, Adult High blood pressure (hypertension) is when the force of blood pumping through the arteries is too strong. The arteries are the blood vessels that carry blood from the heart throughout the body. Hypertension forces the heart to work harder to pump blood and may cause arteries to become narrow or stiff. Untreated or uncontrolled hypertension can lead to a heart attack, heart failure, a stroke, kidney disease, and other problems. A blood pressure reading consists of a higher number over a lower number. Ideally, your blood pressure should be below 120/80. The first ("top") number is called the systolic pressure. It is a measure of the pressure in your arteries as your heart beats. The second ("bottom") number is called the diastolic pressure. It is a measure of the pressure in your arteries as the heart relaxes. What are the causes? The exact cause of this condition is not known. There are some conditions that result in high blood pressure. What increases the risk? Certain factors may make you more likely to develop high blood pressure. Some of these risk factors are under your control, including: Smoking. Not getting enough exercise or physical activity. Being overweight. Having too much fat, sugar, calories, or salt (sodium) in your diet. Drinking too much alcohol. Other risk factors include: Having a personal history of heart disease, diabetes, high cholesterol, or kidney disease. Stress. Having a family history of high blood pressure and high cholesterol. Having obstructive sleep apnea. Age. The risk increases with age. What are the signs or symptoms? High blood pressure may not cause symptoms. Very high blood pressure (hypertensive crisis) may cause: Headache. Fast or irregular heartbeats (palpitations). Shortness of breath. Nosebleed. Nausea and vomiting. Vision changes. Severe chest pain, dizziness, and seizures. How is this diagnosed? This condition is diagnosed by  measuring your blood pressure while you are seated, with your arm resting on a flat surface, your legs uncrossed, and your feet flat on the floor. The cuff of the blood pressure monitor will be placed directly against the skin of your upper arm at the level of your heart. Blood pressure should be measured at least twice using the same arm. Certain conditions can cause a difference in blood pressure between your right and left arms. If you have a high blood pressure reading during one visit or you have normal blood pressure with other risk factors, you may be asked to: Return on a different day to have your blood pressure checked again. Monitor your blood pressure at home for 1 week or longer. If you are diagnosed with hypertension, you may have other blood or imaging tests to help your health care provider understand your overall risk for other conditions. How is this treated? This condition is treated by making healthy lifestyle changes, such as eating healthy foods, exercising more, and reducing your alcohol intake. You may be referred for counseling on a healthy diet and physical activity. Your health care provider may prescribe medicine if lifestyle changes are not enough to get your blood pressure under control and if: Your systolic blood pressure is above 130. Your diastolic blood pressure is above 80. Your personal target blood pressure may vary depending on your medical conditions, your age, and other factors. Follow these instructions at home: Eating and drinking  Eat a diet that is high in fiber and potassium, and low in sodium, added sugar, and fat. An example of this eating plan is called the DASH diet. DASH stands for Dietary Approaches to Stop Hypertension. To eat this way: Eat   plenty of fresh fruits and vegetables. Try to fill one half of your plate at each meal with fruits and vegetables. Eat whole grains, such as whole-wheat pasta, brown rice, or whole-grain bread. Fill about one  fourth of your plate with whole grains. Eat or drink low-fat dairy products, such as skim milk or low-fat yogurt. Avoid fatty cuts of meat, processed or cured meats, and poultry with skin. Fill about one fourth of your plate with lean proteins, such as fish, chicken without skin, beans, eggs, or tofu. Avoid pre-made and processed foods. These tend to be higher in sodium, added sugar, and fat. Reduce your daily sodium intake. Many people with hypertension should eat less than 1,500 mg of sodium a day. Do not drink alcohol if: Your health care provider tells you not to drink. You are pregnant, may be pregnant, or are planning to become pregnant. If you drink alcohol: Limit how much you have to: 0-1 drink a day for women. 0-2 drinks a day for men. Know how much alcohol is in your drink. In the [place] one drink equals one 12 oz bottle of beer (355 mL), one 5 oz glass of wine (148 mL), or one 1 oz glass of hard liquor (44 mL). Lifestyle  Work with your health care provider to maintain a healthy body weight or to lose weight. Ask what an ideal weight is for you. Get at least 30 minutes of exercise that causes your heart to beat faster (aerobic exercise) most days of the week. Activities may include walking, swimming, or biking. Include exercise to strengthen your muscles (resistance exercise), such as Pilates or lifting weights, as part of your weekly exercise routine. Try to do these types of exercises for 30 minutes at least 3 days a week. Do not use any products that contain nicotine or tobacco. These products include cigarettes, chewing tobacco, and vaping devices, such as e-cigarettes. If you need help quitting, ask your health care provider. Monitor your blood pressure at home as told by your health care provider. Keep all follow-up visits. This is important. Medicines Take over-the-counter and prescription medicines only as told by your health care provider. Follow directions carefully. Blood  pressure medicines must be taken as prescribed. Do not skip doses of blood pressure medicine. Doing this puts you at risk for problems and can make the medicine less effective. Ask your health care provider about side effects or reactions to medicines that you should watch for. Contact a health care provider if you: Think you are having a reaction to a medicine you are taking. Have headaches that keep coming back (recurring). Feel dizzy. Have swelling in your ankles. Have trouble with your vision. Get help right away if you: Develop a severe headache or confusion. Have unusual weakness or numbness. Feel faint. Have severe pain in your chest or abdomen. Vomit repeatedly. Have trouble breathing. These symptoms may be an emergency. Get help right away. Call 911. Do not wait to see if the symptoms will go away. Do not drive yourself to the hospital. Summary Hypertension is when the force of blood pumping through your arteries is too strong. If this condition is not controlled, it may put you at risk for serious complications. Your personal target blood pressure may vary depending on your medical conditions, your age, and other factors. For most people, a normal blood pressure is less than 120/80. Hypertension is treated with lifestyle changes, medicines, or a combination of both. Lifestyle changes include losing weight, eating a healthy,   low-sodium diet, exercising more, and limiting alcohol. This information is not intended to replace advice given to you by your health care provider. Make sure you discuss any questions you have with your health care provider. Document Revised: 10/19/2021 Document Reviewed: 10/19/2021 Elsevier Patient Education  2024 Elsevier Inc.  

## 2023-10-26 ENCOUNTER — Inpatient Hospital Stay: Payer: 59 | Admitting: Internal Medicine

## 2023-10-27 ENCOUNTER — Telehealth: Payer: Self-pay

## 2023-10-27 ENCOUNTER — Encounter (INDEPENDENT_AMBULATORY_CARE_PROVIDER_SITE_OTHER): Payer: Self-pay | Admitting: Otolaryngology

## 2023-10-27 NOTE — Telephone Encounter (Signed)
Patients wife called requesting a referral to an eye doctor since having vision problems from stroke.

## 2023-10-27 NOTE — Assessment & Plan Note (Signed)
His blood sugar is well controlled 

## 2023-10-28 DIAGNOSIS — G8191 Hemiplegia, unspecified affecting right dominant side: Secondary | ICD-10-CM | POA: Diagnosis not present

## 2023-10-28 DIAGNOSIS — Z79899 Other long term (current) drug therapy: Secondary | ICD-10-CM | POA: Diagnosis not present

## 2023-10-28 DIAGNOSIS — S88119A Complete traumatic amputation at level between knee and ankle, unspecified lower leg, initial encounter: Secondary | ICD-10-CM | POA: Diagnosis not present

## 2023-10-28 DIAGNOSIS — Z9483 Pancreas transplant status: Secondary | ICD-10-CM | POA: Diagnosis not present

## 2023-10-28 DIAGNOSIS — N1831 Chronic kidney disease, stage 3a: Secondary | ICD-10-CM | POA: Diagnosis not present

## 2023-10-28 DIAGNOSIS — G546 Phantom limb syndrome with pain: Secondary | ICD-10-CM | POA: Diagnosis not present

## 2023-10-28 DIAGNOSIS — G890 Central pain syndrome: Secondary | ICD-10-CM | POA: Diagnosis not present

## 2023-10-30 ENCOUNTER — Other Ambulatory Visit: Payer: Self-pay

## 2023-10-30 DIAGNOSIS — I69320 Aphasia following cerebral infarction: Secondary | ICD-10-CM | POA: Diagnosis not present

## 2023-10-30 DIAGNOSIS — N390 Urinary tract infection, site not specified: Secondary | ICD-10-CM | POA: Diagnosis not present

## 2023-10-30 DIAGNOSIS — I639 Cerebral infarction, unspecified: Secondary | ICD-10-CM | POA: Diagnosis not present

## 2023-10-30 DIAGNOSIS — Z9483 Pancreas transplant status: Secondary | ICD-10-CM | POA: Diagnosis not present

## 2023-10-30 MED ORDER — OXYCODONE HCL 10 MG PO TABS
5.0000 mg | ORAL_TABLET | Freq: Three times a day (TID) | ORAL | 0 refills | Status: DC | PRN
  Filled 2023-10-30: qty 63, 21d supply, fill #0

## 2023-10-30 NOTE — Telephone Encounter (Signed)
Wife notified.

## 2023-10-31 ENCOUNTER — Other Ambulatory Visit: Payer: Self-pay

## 2023-10-31 ENCOUNTER — Encounter: Payer: Self-pay | Admitting: Registered Nurse

## 2023-10-31 ENCOUNTER — Encounter: Payer: 59 | Attending: Registered Nurse | Admitting: Registered Nurse

## 2023-10-31 VITALS — BP 143/72 | HR 70 | Ht 65.0 in

## 2023-10-31 DIAGNOSIS — I10 Essential (primary) hypertension: Secondary | ICD-10-CM

## 2023-10-31 DIAGNOSIS — E7849 Other hyperlipidemia: Secondary | ICD-10-CM | POA: Diagnosis not present

## 2023-10-31 DIAGNOSIS — N390 Urinary tract infection, site not specified: Secondary | ICD-10-CM | POA: Diagnosis not present

## 2023-10-31 DIAGNOSIS — Z9483 Pancreas transplant status: Secondary | ICD-10-CM | POA: Diagnosis not present

## 2023-10-31 DIAGNOSIS — I639 Cerebral infarction, unspecified: Secondary | ICD-10-CM

## 2023-10-31 DIAGNOSIS — I69320 Aphasia following cerebral infarction: Secondary | ICD-10-CM | POA: Diagnosis not present

## 2023-10-31 NOTE — Progress Notes (Unsigned)
Subjective:    Patient ID: [name], male    DOB: 1976/01/22, 47 y.o.   MRN: [id number]  HPI: [name] is a 47 y.o. male who returns for follow up appointment for chronic pain and medication refill. states *** pain is located in  ***. rates pain ***. current exercise regime is walking and performing stretching exercises.   Pain Inventory Average Pain 8 Pain Right Now 9 My pain is intermittent, dull, and aching  LOCATION OF PAIN  Back, Legs, Right arm   BOWEL Number of stools per week: Daily Oral laxative use No   BLADDER Normal    Mobility walk with assistance ability to climb steps?  no do you drive?  no use a wheelchair Do you have any goals in this area?  yes  Function disabled: date disabled .  Neuro/Psych trouble walking spasms  Prior Studies Any changes since last visit?  no  Physicians involved in your care Any changes since last visit?  no   Family History  Problem Relation Age of Onset   Hypertension Mother    Diabetes Mother    Kidney disease Mother    Diabetes Maternal Grandmother    Diabetes Paternal Grandmother    Diabetes Other    Hypertension Other    Lung cancer Maternal Aunt    Colon cancer Neg Hx    Esophageal cancer Neg Hx    Rectal cancer Neg Hx    Stomach cancer Neg Hx    Social History   Socioeconomic History   Marital status: Married    Spouse name: Not on file   Number of children: 1   Years of education: 11   Highest education level: Not on file  Occupational History   Occupation: Disability  Tobacco Use   Smoking status: Former    Types: Cigars    Quit date: 07/06/2022    Years since quitting: 1.3   Smokeless tobacco: Never   Tobacco comments:    black and mild  Vaping Use   Vaping status: Never Used  Substance and Sexual Activity   Alcohol use: No   Drug use: Not Currently    Frequency: 3.0 times per week    Types: Marijuana    Comment: Occasionally   Sexual activity: Yes     Partners: Female  Other Topics Concern   Not on file  Social History Narrative   Fun: Restore old cars    Social Determinants of Health   Financial Resource Strain: Low Risk  (09/11/2023)   Overall Financial Resource Strain (CARDIA)    Difficulty of Paying Living Expenses: Not hard at all  Food Insecurity: No Food Insecurity (09/29/2023)   Hunger Vital Sign    Worried About Running Out of Food in the Last Year: Never true    Ran Out of Food in the Last Year: Never true  Transportation Needs: No Transportation Needs (09/20/2023)   PRAPARE - Administrator, Civil Service (Medical): No    Lack of Transportation (Non-Medical): No  Physical Activity: Inactive (09/11/2023)   Exercise Vital Sign    Days of Exercise per Week: 0 days    Minutes of Exercise per Session: 0 min  Stress: No Stress Concern Present (09/11/2023)   [employer] of Occupational Health - Occupational Stress Questionnaire    Feeling of Stress : Not at all  Social Connections: Socially Integrated (09/11/2023)   Social Connection and Isolation Panel [NHANES]    Frequency of Communication with  Friends and Family: More than three times a week    Frequency of Social Gatherings with Friends and Family: More than three times a week    Attends Religious Services: More than 4 times per year    Active Member of Clubs or Organizations: Yes    Attends Engineer, structural: More than 4 times per year    Marital Status: Married   Past Surgical History:  Procedure Laterality Date   AV FISTULA PLACEMENT  08/2011   left upper arm   BELOW KNEE LEG AMPUTATION  "it's been awhile"   bilaterally   CATARACT EXTRACTION  ~ 2011   right   COMBINED KIDNEY-PANCREAS TRANSPLANT  2014   ESOPHAGOGASTRODUODENOSCOPY N/A 12/30/2016   Procedure: ESOPHAGOGASTRODUODENOSCOPY (EGD);  Surgeon: Jeani Hawking, MD;  Location: [facility] ENDOSCOPY;  Service: Endoscopy;  Laterality: N/A;   OLECRANON BURSECTOMY Right 06/23/2020   Procedure: RIGHT  ELBOW EXCISION OLECRANON BURSITIS;  Surgeon: Nadara Mustard, MD;  Location:  SURGERY CENTER;  Service: Orthopedics;  Laterality: Right;   Past Medical History:  Diagnosis Date   AMPUTATION, BELOW KNEE, 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   Diabetes mellitus without complication [facility])    no since pancreas transplant   Dialysis patient [facility]) 04/18/2012   "[facility]; [place], [place], [place]"   Gastroparesis    Gastropathy    GERD (gastroesophageal reflux disease)    Hypertension    MRSA infection    over 10 years ago per patient. in legs   BP (!) 143/72   Pulse 70   Ht 5\' 5"  (1.651 m)   SpO2 98%   BMI 29.28 kg/m   Opioid Risk Score:   Fall Risk Score:  `1  Depression screen [facility]) 2/9     10/31/2023   11:15 AM 09/11/2023    3:36 PM 09/06/2022    4:08 PM 08/26/2016   10:04 AM 04/22/2016    9:41 AM 12/14/2015   11:56 AM 07/29/2014    1:56 PM  Depression screen PHQ 2/9  Decreased Interest 0 0 0 0 0 0 0  Down, Depressed, Hopeless 0 0 0 0 0 0 0  PHQ - 2 Score 0 0 0 0 0 0 0  Altered sleeping 0 0       Tired, decreased energy 0 0       Change in appetite 0 0       Feeling bad or failure about yourself  0 0       Trouble concentrating 0 0       Moving slowly or fidgety/restless 1 0       Suicidal thoughts 0 0       PHQ-9 Score 1 0       Difficult doing work/chores Somewhat difficult Not difficult at all           Review of Systems  Musculoskeletal:  Positive for back pain and gait problem.       Right arm pain   All other systems reviewed and are negative.     Objective:   Physical Exam        Assessment & Plan:

## 2023-11-01 DIAGNOSIS — I69320 Aphasia following cerebral infarction: Secondary | ICD-10-CM | POA: Diagnosis not present

## 2023-11-01 DIAGNOSIS — Z9483 Pancreas transplant status: Secondary | ICD-10-CM | POA: Diagnosis not present

## 2023-11-01 DIAGNOSIS — N390 Urinary tract infection, site not specified: Secondary | ICD-10-CM | POA: Diagnosis not present

## 2023-11-01 DIAGNOSIS — I639 Cerebral infarction, unspecified: Secondary | ICD-10-CM | POA: Diagnosis not present

## 2023-11-02 ENCOUNTER — Encounter: Payer: Self-pay | Admitting: Registered Nurse

## 2023-11-02 DIAGNOSIS — N390 Urinary tract infection, site not specified: Secondary | ICD-10-CM | POA: Diagnosis not present

## 2023-11-02 DIAGNOSIS — Z9483 Pancreas transplant status: Secondary | ICD-10-CM | POA: Diagnosis not present

## 2023-11-02 DIAGNOSIS — I639 Cerebral infarction, unspecified: Secondary | ICD-10-CM | POA: Diagnosis not present

## 2023-11-02 DIAGNOSIS — I69320 Aphasia following cerebral infarction: Secondary | ICD-10-CM | POA: Diagnosis not present

## 2023-11-06 DIAGNOSIS — Z9483 Pancreas transplant status: Secondary | ICD-10-CM | POA: Diagnosis not present

## 2023-11-06 DIAGNOSIS — I69320 Aphasia following cerebral infarction: Secondary | ICD-10-CM | POA: Diagnosis not present

## 2023-11-06 DIAGNOSIS — I639 Cerebral infarction, unspecified: Secondary | ICD-10-CM | POA: Diagnosis not present

## 2023-11-06 DIAGNOSIS — N390 Urinary tract infection, site not specified: Secondary | ICD-10-CM | POA: Diagnosis not present

## 2023-11-07 DIAGNOSIS — I69320 Aphasia following cerebral infarction: Secondary | ICD-10-CM | POA: Diagnosis not present

## 2023-11-07 DIAGNOSIS — I639 Cerebral infarction, unspecified: Secondary | ICD-10-CM | POA: Diagnosis not present

## 2023-11-07 DIAGNOSIS — N390 Urinary tract infection, site not specified: Secondary | ICD-10-CM | POA: Diagnosis not present

## 2023-11-07 DIAGNOSIS — Z9483 Pancreas transplant status: Secondary | ICD-10-CM | POA: Diagnosis not present

## 2023-11-09 DIAGNOSIS — I639 Cerebral infarction, unspecified: Secondary | ICD-10-CM | POA: Diagnosis not present

## 2023-11-09 DIAGNOSIS — N390 Urinary tract infection, site not specified: Secondary | ICD-10-CM | POA: Diagnosis not present

## 2023-11-09 DIAGNOSIS — I69320 Aphasia following cerebral infarction: Secondary | ICD-10-CM | POA: Diagnosis not present

## 2023-11-09 DIAGNOSIS — Z9483 Pancreas transplant status: Secondary | ICD-10-CM | POA: Diagnosis not present

## 2023-11-10 ENCOUNTER — Other Ambulatory Visit: Payer: Self-pay | Admitting: Physical Medicine and Rehabilitation

## 2023-11-10 DIAGNOSIS — E785 Hyperlipidemia, unspecified: Secondary | ICD-10-CM

## 2023-11-13 DIAGNOSIS — I69320 Aphasia following cerebral infarction: Secondary | ICD-10-CM | POA: Diagnosis not present

## 2023-11-13 DIAGNOSIS — I639 Cerebral infarction, unspecified: Secondary | ICD-10-CM | POA: Diagnosis not present

## 2023-11-13 DIAGNOSIS — Z9483 Pancreas transplant status: Secondary | ICD-10-CM | POA: Diagnosis not present

## 2023-11-13 DIAGNOSIS — N390 Urinary tract infection, site not specified: Secondary | ICD-10-CM | POA: Diagnosis not present

## 2023-11-14 ENCOUNTER — Other Ambulatory Visit: Payer: Self-pay

## 2023-11-14 DIAGNOSIS — N390 Urinary tract infection, site not specified: Secondary | ICD-10-CM | POA: Diagnosis not present

## 2023-11-14 DIAGNOSIS — I69320 Aphasia following cerebral infarction: Secondary | ICD-10-CM | POA: Diagnosis not present

## 2023-11-14 DIAGNOSIS — I639 Cerebral infarction, unspecified: Secondary | ICD-10-CM | POA: Diagnosis not present

## 2023-11-14 DIAGNOSIS — Z9483 Pancreas transplant status: Secondary | ICD-10-CM | POA: Diagnosis not present

## 2023-11-14 MED ORDER — ROSUVASTATIN CALCIUM 20 MG PO TABS
20.0000 mg | ORAL_TABLET | Freq: Every day | ORAL | 0 refills | Status: DC
  Filled 2023-11-14: qty 30, 30d supply, fill #0

## 2023-11-14 MED ORDER — POTASSIUM CHLORIDE CRYS ER 10 MEQ PO TBCR
10.0000 meq | EXTENDED_RELEASE_TABLET | Freq: Every day | ORAL | 0 refills | Status: DC
  Filled 2023-11-14: qty 30, 30d supply, fill #0

## 2023-11-14 MED ORDER — PANTOPRAZOLE SODIUM 40 MG PO TBEC
40.0000 mg | DELAYED_RELEASE_TABLET | Freq: Two times a day (BID) | ORAL | 0 refills | Status: DC
  Filled 2023-11-14: qty 60, 30d supply, fill #0

## 2023-11-14 MED ORDER — CARVEDILOL 12.5 MG PO TABS
12.5000 mg | ORAL_TABLET | Freq: Two times a day (BID) | ORAL | 0 refills | Status: DC
  Filled 2023-11-14: qty 60, 30d supply, fill #0

## 2023-11-14 MED ORDER — CLOPIDOGREL BISULFATE 75 MG PO TABS
75.0000 mg | ORAL_TABLET | Freq: Every day | ORAL | 0 refills | Status: DC
  Filled 2023-11-14: qty 30, 30d supply, fill #0

## 2023-11-16 ENCOUNTER — Inpatient Hospital Stay: Payer: 59 | Admitting: Adult Health

## 2023-11-16 ENCOUNTER — Other Ambulatory Visit: Payer: Self-pay

## 2023-11-16 DIAGNOSIS — N390 Urinary tract infection, site not specified: Secondary | ICD-10-CM | POA: Diagnosis not present

## 2023-11-16 DIAGNOSIS — Z79899 Other long term (current) drug therapy: Secondary | ICD-10-CM | POA: Diagnosis not present

## 2023-11-16 DIAGNOSIS — E7800 Pure hypercholesterolemia, unspecified: Secondary | ICD-10-CM | POA: Diagnosis not present

## 2023-11-16 DIAGNOSIS — N183 Chronic kidney disease, stage 3 unspecified: Secondary | ICD-10-CM | POA: Diagnosis not present

## 2023-11-16 DIAGNOSIS — D751 Secondary polycythemia: Secondary | ICD-10-CM | POA: Diagnosis not present

## 2023-11-16 DIAGNOSIS — Z9483 Pancreas transplant status: Secondary | ICD-10-CM | POA: Diagnosis not present

## 2023-11-16 DIAGNOSIS — Z940 Kidney transplant status: Secondary | ICD-10-CM | POA: Diagnosis not present

## 2023-11-16 DIAGNOSIS — I129 Hypertensive chronic kidney disease with stage 1 through stage 4 chronic kidney disease, or unspecified chronic kidney disease: Secondary | ICD-10-CM | POA: Diagnosis not present

## 2023-11-17 ENCOUNTER — Other Ambulatory Visit: Payer: Self-pay

## 2023-11-17 DIAGNOSIS — I639 Cerebral infarction, unspecified: Secondary | ICD-10-CM | POA: Diagnosis not present

## 2023-11-17 DIAGNOSIS — S88119A Complete traumatic amputation at level between knee and ankle, unspecified lower leg, initial encounter: Secondary | ICD-10-CM | POA: Diagnosis not present

## 2023-11-17 DIAGNOSIS — Z9483 Pancreas transplant status: Secondary | ICD-10-CM | POA: Diagnosis not present

## 2023-11-17 DIAGNOSIS — G890 Central pain syndrome: Secondary | ICD-10-CM | POA: Diagnosis not present

## 2023-11-17 DIAGNOSIS — N1831 Chronic kidney disease, stage 3a: Secondary | ICD-10-CM | POA: Diagnosis not present

## 2023-11-17 DIAGNOSIS — N390 Urinary tract infection, site not specified: Secondary | ICD-10-CM | POA: Diagnosis not present

## 2023-11-17 DIAGNOSIS — I69320 Aphasia following cerebral infarction: Secondary | ICD-10-CM | POA: Diagnosis not present

## 2023-11-17 DIAGNOSIS — Z79899 Other long term (current) drug therapy: Secondary | ICD-10-CM | POA: Diagnosis not present

## 2023-11-17 DIAGNOSIS — G8191 Hemiplegia, unspecified affecting right dominant side: Secondary | ICD-10-CM | POA: Diagnosis not present

## 2023-11-17 DIAGNOSIS — G546 Phantom limb syndrome with pain: Secondary | ICD-10-CM | POA: Diagnosis not present

## 2023-11-17 MED ORDER — OXYCODONE HCL 10 MG PO TABS
5.0000 mg | ORAL_TABLET | Freq: Four times a day (QID) | ORAL | 0 refills | Status: DC | PRN
Start: 2023-11-17 — End: 2023-12-15
  Filled 2023-11-17: qty 120, 30d supply, fill #0

## 2023-11-20 ENCOUNTER — Other Ambulatory Visit: Payer: Self-pay

## 2023-11-20 DIAGNOSIS — I69320 Aphasia following cerebral infarction: Secondary | ICD-10-CM | POA: Diagnosis not present

## 2023-11-20 DIAGNOSIS — Z9483 Pancreas transplant status: Secondary | ICD-10-CM | POA: Diagnosis not present

## 2023-11-20 DIAGNOSIS — I639 Cerebral infarction, unspecified: Secondary | ICD-10-CM | POA: Diagnosis not present

## 2023-11-20 DIAGNOSIS — N390 Urinary tract infection, site not specified: Secondary | ICD-10-CM | POA: Diagnosis not present

## 2023-11-21 DIAGNOSIS — K219 Gastro-esophageal reflux disease without esophagitis: Secondary | ICD-10-CM | POA: Diagnosis not present

## 2023-11-21 DIAGNOSIS — R339 Retention of urine, unspecified: Secondary | ICD-10-CM | POA: Diagnosis not present

## 2023-11-21 DIAGNOSIS — Z89512 Acquired absence of left leg below knee: Secondary | ICD-10-CM | POA: Diagnosis not present

## 2023-11-21 DIAGNOSIS — Z9483 Pancreas transplant status: Secondary | ICD-10-CM | POA: Diagnosis not present

## 2023-11-21 DIAGNOSIS — I10 Essential (primary) hypertension: Secondary | ICD-10-CM | POA: Diagnosis not present

## 2023-11-21 DIAGNOSIS — Z89511 Acquired absence of right leg below knee: Secondary | ICD-10-CM | POA: Diagnosis not present

## 2023-11-21 DIAGNOSIS — I129 Hypertensive chronic kidney disease with stage 1 through stage 4 chronic kidney disease, or unspecified chronic kidney disease: Secondary | ICD-10-CM | POA: Diagnosis not present

## 2023-11-21 DIAGNOSIS — E785 Hyperlipidemia, unspecified: Secondary | ICD-10-CM | POA: Diagnosis not present

## 2023-11-21 DIAGNOSIS — E1151 Type 2 diabetes mellitus with diabetic peripheral angiopathy without gangrene: Secondary | ICD-10-CM | POA: Diagnosis not present

## 2023-11-21 DIAGNOSIS — Z87891 Personal history of nicotine dependence: Secondary | ICD-10-CM | POA: Diagnosis not present

## 2023-11-21 DIAGNOSIS — I69320 Aphasia following cerebral infarction: Secondary | ICD-10-CM | POA: Diagnosis not present

## 2023-11-21 DIAGNOSIS — Z9181 History of falling: Secondary | ICD-10-CM | POA: Diagnosis not present

## 2023-11-21 DIAGNOSIS — N179 Acute kidney failure, unspecified: Secondary | ICD-10-CM | POA: Diagnosis not present

## 2023-11-21 DIAGNOSIS — I69392 Facial weakness following cerebral infarction: Secondary | ICD-10-CM | POA: Diagnosis not present

## 2023-11-21 DIAGNOSIS — N189 Chronic kidney disease, unspecified: Secondary | ICD-10-CM | POA: Diagnosis not present

## 2023-11-21 DIAGNOSIS — E1122 Type 2 diabetes mellitus with diabetic chronic kidney disease: Secondary | ICD-10-CM | POA: Diagnosis not present

## 2023-11-21 DIAGNOSIS — Z7902 Long term (current) use of antithrombotics/antiplatelets: Secondary | ICD-10-CM | POA: Diagnosis not present

## 2023-11-21 DIAGNOSIS — Z940 Kidney transplant status: Secondary | ICD-10-CM | POA: Diagnosis not present

## 2023-11-21 DIAGNOSIS — Z7982 Long term (current) use of aspirin: Secondary | ICD-10-CM | POA: Diagnosis not present

## 2023-11-21 DIAGNOSIS — Z604 Social exclusion and rejection: Secondary | ICD-10-CM | POA: Diagnosis not present

## 2023-11-21 DIAGNOSIS — N390 Urinary tract infection, site not specified: Secondary | ICD-10-CM | POA: Diagnosis not present

## 2023-11-21 DIAGNOSIS — Z7952 Long term (current) use of systemic steroids: Secondary | ICD-10-CM | POA: Diagnosis not present

## 2023-11-27 DIAGNOSIS — Z89512 Acquired absence of left leg below knee: Secondary | ICD-10-CM | POA: Diagnosis not present

## 2023-11-27 DIAGNOSIS — Z7952 Long term (current) use of systemic steroids: Secondary | ICD-10-CM | POA: Diagnosis not present

## 2023-11-27 DIAGNOSIS — E785 Hyperlipidemia, unspecified: Secondary | ICD-10-CM | POA: Diagnosis not present

## 2023-11-27 DIAGNOSIS — N390 Urinary tract infection, site not specified: Secondary | ICD-10-CM | POA: Diagnosis not present

## 2023-11-27 DIAGNOSIS — E1122 Type 2 diabetes mellitus with diabetic chronic kidney disease: Secondary | ICD-10-CM | POA: Diagnosis not present

## 2023-11-27 DIAGNOSIS — I69320 Aphasia following cerebral infarction: Secondary | ICD-10-CM | POA: Diagnosis not present

## 2023-11-27 DIAGNOSIS — R339 Retention of urine, unspecified: Secondary | ICD-10-CM | POA: Diagnosis not present

## 2023-11-27 DIAGNOSIS — Z89511 Acquired absence of right leg below knee: Secondary | ICD-10-CM | POA: Diagnosis not present

## 2023-11-27 DIAGNOSIS — Z9181 History of falling: Secondary | ICD-10-CM | POA: Diagnosis not present

## 2023-11-27 DIAGNOSIS — R110 Nausea: Secondary | ICD-10-CM | POA: Diagnosis not present

## 2023-11-27 DIAGNOSIS — I10 Essential (primary) hypertension: Secondary | ICD-10-CM | POA: Diagnosis not present

## 2023-11-27 DIAGNOSIS — R799 Abnormal finding of blood chemistry, unspecified: Secondary | ICD-10-CM | POA: Diagnosis not present

## 2023-11-27 DIAGNOSIS — Z940 Kidney transplant status: Secondary | ICD-10-CM | POA: Diagnosis not present

## 2023-11-27 DIAGNOSIS — N189 Chronic kidney disease, unspecified: Secondary | ICD-10-CM | POA: Diagnosis not present

## 2023-11-27 DIAGNOSIS — N179 Acute kidney failure, unspecified: Secondary | ICD-10-CM | POA: Diagnosis not present

## 2023-11-27 DIAGNOSIS — E1151 Type 2 diabetes mellitus with diabetic peripheral angiopathy without gangrene: Secondary | ICD-10-CM | POA: Diagnosis not present

## 2023-11-27 DIAGNOSIS — I69392 Facial weakness following cerebral infarction: Secondary | ICD-10-CM | POA: Diagnosis not present

## 2023-11-27 DIAGNOSIS — K219 Gastro-esophageal reflux disease without esophagitis: Secondary | ICD-10-CM | POA: Diagnosis not present

## 2023-11-27 DIAGNOSIS — I129 Hypertensive chronic kidney disease with stage 1 through stage 4 chronic kidney disease, or unspecified chronic kidney disease: Secondary | ICD-10-CM | POA: Diagnosis not present

## 2023-11-27 DIAGNOSIS — Z7982 Long term (current) use of aspirin: Secondary | ICD-10-CM | POA: Diagnosis not present

## 2023-11-27 DIAGNOSIS — Z7902 Long term (current) use of antithrombotics/antiplatelets: Secondary | ICD-10-CM | POA: Diagnosis not present

## 2023-11-27 DIAGNOSIS — Z604 Social exclusion and rejection: Secondary | ICD-10-CM | POA: Diagnosis not present

## 2023-11-27 DIAGNOSIS — Z9483 Pancreas transplant status: Secondary | ICD-10-CM | POA: Diagnosis not present

## 2023-11-27 DIAGNOSIS — Z87891 Personal history of nicotine dependence: Secondary | ICD-10-CM | POA: Diagnosis not present

## 2023-11-28 DIAGNOSIS — N189 Chronic kidney disease, unspecified: Secondary | ICD-10-CM | POA: Diagnosis not present

## 2023-11-28 DIAGNOSIS — Z7902 Long term (current) use of antithrombotics/antiplatelets: Secondary | ICD-10-CM | POA: Diagnosis not present

## 2023-11-28 DIAGNOSIS — N179 Acute kidney failure, unspecified: Secondary | ICD-10-CM | POA: Diagnosis not present

## 2023-11-28 DIAGNOSIS — E785 Hyperlipidemia, unspecified: Secondary | ICD-10-CM | POA: Diagnosis not present

## 2023-11-28 DIAGNOSIS — Z9483 Pancreas transplant status: Secondary | ICD-10-CM | POA: Diagnosis not present

## 2023-11-28 DIAGNOSIS — I129 Hypertensive chronic kidney disease with stage 1 through stage 4 chronic kidney disease, or unspecified chronic kidney disease: Secondary | ICD-10-CM | POA: Diagnosis not present

## 2023-11-28 DIAGNOSIS — Z7982 Long term (current) use of aspirin: Secondary | ICD-10-CM | POA: Diagnosis not present

## 2023-11-28 DIAGNOSIS — Z9181 History of falling: Secondary | ICD-10-CM | POA: Diagnosis not present

## 2023-11-28 DIAGNOSIS — K219 Gastro-esophageal reflux disease without esophagitis: Secondary | ICD-10-CM | POA: Diagnosis not present

## 2023-11-28 DIAGNOSIS — I69320 Aphasia following cerebral infarction: Secondary | ICD-10-CM | POA: Diagnosis not present

## 2023-11-28 DIAGNOSIS — Z7952 Long term (current) use of systemic steroids: Secondary | ICD-10-CM | POA: Diagnosis not present

## 2023-11-28 DIAGNOSIS — Z604 Social exclusion and rejection: Secondary | ICD-10-CM | POA: Diagnosis not present

## 2023-11-28 DIAGNOSIS — I69392 Facial weakness following cerebral infarction: Secondary | ICD-10-CM | POA: Diagnosis not present

## 2023-11-28 DIAGNOSIS — R339 Retention of urine, unspecified: Secondary | ICD-10-CM | POA: Diagnosis not present

## 2023-11-28 DIAGNOSIS — E1151 Type 2 diabetes mellitus with diabetic peripheral angiopathy without gangrene: Secondary | ICD-10-CM | POA: Diagnosis not present

## 2023-11-28 DIAGNOSIS — I10 Essential (primary) hypertension: Secondary | ICD-10-CM | POA: Diagnosis not present

## 2023-11-28 DIAGNOSIS — E1122 Type 2 diabetes mellitus with diabetic chronic kidney disease: Secondary | ICD-10-CM | POA: Diagnosis not present

## 2023-11-28 DIAGNOSIS — Z89511 Acquired absence of right leg below knee: Secondary | ICD-10-CM | POA: Diagnosis not present

## 2023-11-28 DIAGNOSIS — Z87891 Personal history of nicotine dependence: Secondary | ICD-10-CM | POA: Diagnosis not present

## 2023-11-28 DIAGNOSIS — Z940 Kidney transplant status: Secondary | ICD-10-CM | POA: Diagnosis not present

## 2023-11-28 DIAGNOSIS — N390 Urinary tract infection, site not specified: Secondary | ICD-10-CM | POA: Diagnosis not present

## 2023-11-28 DIAGNOSIS — Z89512 Acquired absence of left leg below knee: Secondary | ICD-10-CM | POA: Diagnosis not present

## 2023-12-01 DIAGNOSIS — Z7982 Long term (current) use of aspirin: Secondary | ICD-10-CM | POA: Diagnosis not present

## 2023-12-01 DIAGNOSIS — I69392 Facial weakness following cerebral infarction: Secondary | ICD-10-CM | POA: Diagnosis not present

## 2023-12-01 DIAGNOSIS — I10 Essential (primary) hypertension: Secondary | ICD-10-CM | POA: Diagnosis not present

## 2023-12-01 DIAGNOSIS — R339 Retention of urine, unspecified: Secondary | ICD-10-CM | POA: Diagnosis not present

## 2023-12-01 DIAGNOSIS — N390 Urinary tract infection, site not specified: Secondary | ICD-10-CM | POA: Diagnosis not present

## 2023-12-01 DIAGNOSIS — E1122 Type 2 diabetes mellitus with diabetic chronic kidney disease: Secondary | ICD-10-CM | POA: Diagnosis not present

## 2023-12-01 DIAGNOSIS — Z9483 Pancreas transplant status: Secondary | ICD-10-CM | POA: Diagnosis not present

## 2023-12-01 DIAGNOSIS — Z89512 Acquired absence of left leg below knee: Secondary | ICD-10-CM | POA: Diagnosis not present

## 2023-12-01 DIAGNOSIS — Z7952 Long term (current) use of systemic steroids: Secondary | ICD-10-CM | POA: Diagnosis not present

## 2023-12-01 DIAGNOSIS — I69320 Aphasia following cerebral infarction: Secondary | ICD-10-CM | POA: Diagnosis not present

## 2023-12-01 DIAGNOSIS — Z9181 History of falling: Secondary | ICD-10-CM | POA: Diagnosis not present

## 2023-12-01 DIAGNOSIS — N179 Acute kidney failure, unspecified: Secondary | ICD-10-CM | POA: Diagnosis not present

## 2023-12-01 DIAGNOSIS — Z604 Social exclusion and rejection: Secondary | ICD-10-CM | POA: Diagnosis not present

## 2023-12-01 DIAGNOSIS — Z7902 Long term (current) use of antithrombotics/antiplatelets: Secondary | ICD-10-CM | POA: Diagnosis not present

## 2023-12-01 DIAGNOSIS — K219 Gastro-esophageal reflux disease without esophagitis: Secondary | ICD-10-CM | POA: Diagnosis not present

## 2023-12-01 DIAGNOSIS — Z87891 Personal history of nicotine dependence: Secondary | ICD-10-CM | POA: Diagnosis not present

## 2023-12-01 DIAGNOSIS — E785 Hyperlipidemia, unspecified: Secondary | ICD-10-CM | POA: Diagnosis not present

## 2023-12-01 DIAGNOSIS — Z940 Kidney transplant status: Secondary | ICD-10-CM | POA: Diagnosis not present

## 2023-12-01 DIAGNOSIS — Z89511 Acquired absence of right leg below knee: Secondary | ICD-10-CM | POA: Diagnosis not present

## 2023-12-01 DIAGNOSIS — I129 Hypertensive chronic kidney disease with stage 1 through stage 4 chronic kidney disease, or unspecified chronic kidney disease: Secondary | ICD-10-CM | POA: Diagnosis not present

## 2023-12-01 DIAGNOSIS — E1151 Type 2 diabetes mellitus with diabetic peripheral angiopathy without gangrene: Secondary | ICD-10-CM | POA: Diagnosis not present

## 2023-12-01 DIAGNOSIS — N189 Chronic kidney disease, unspecified: Secondary | ICD-10-CM | POA: Diagnosis not present

## 2023-12-04 DIAGNOSIS — R339 Retention of urine, unspecified: Secondary | ICD-10-CM | POA: Diagnosis not present

## 2023-12-04 DIAGNOSIS — Z604 Social exclusion and rejection: Secondary | ICD-10-CM | POA: Diagnosis not present

## 2023-12-04 DIAGNOSIS — Z89512 Acquired absence of left leg below knee: Secondary | ICD-10-CM | POA: Diagnosis not present

## 2023-12-04 DIAGNOSIS — Z87891 Personal history of nicotine dependence: Secondary | ICD-10-CM | POA: Diagnosis not present

## 2023-12-04 DIAGNOSIS — Z940 Kidney transplant status: Secondary | ICD-10-CM | POA: Diagnosis not present

## 2023-12-04 DIAGNOSIS — Z7902 Long term (current) use of antithrombotics/antiplatelets: Secondary | ICD-10-CM | POA: Diagnosis not present

## 2023-12-04 DIAGNOSIS — I69392 Facial weakness following cerebral infarction: Secondary | ICD-10-CM | POA: Diagnosis not present

## 2023-12-04 DIAGNOSIS — E1122 Type 2 diabetes mellitus with diabetic chronic kidney disease: Secondary | ICD-10-CM | POA: Diagnosis not present

## 2023-12-04 DIAGNOSIS — K219 Gastro-esophageal reflux disease without esophagitis: Secondary | ICD-10-CM | POA: Diagnosis not present

## 2023-12-04 DIAGNOSIS — E785 Hyperlipidemia, unspecified: Secondary | ICD-10-CM | POA: Diagnosis not present

## 2023-12-04 DIAGNOSIS — Z89511 Acquired absence of right leg below knee: Secondary | ICD-10-CM | POA: Diagnosis not present

## 2023-12-04 DIAGNOSIS — I129 Hypertensive chronic kidney disease with stage 1 through stage 4 chronic kidney disease, or unspecified chronic kidney disease: Secondary | ICD-10-CM | POA: Diagnosis not present

## 2023-12-04 DIAGNOSIS — I10 Essential (primary) hypertension: Secondary | ICD-10-CM | POA: Diagnosis not present

## 2023-12-04 DIAGNOSIS — Z7952 Long term (current) use of systemic steroids: Secondary | ICD-10-CM | POA: Diagnosis not present

## 2023-12-04 DIAGNOSIS — N390 Urinary tract infection, site not specified: Secondary | ICD-10-CM | POA: Diagnosis not present

## 2023-12-04 DIAGNOSIS — Z7982 Long term (current) use of aspirin: Secondary | ICD-10-CM | POA: Diagnosis not present

## 2023-12-04 DIAGNOSIS — Z9181 History of falling: Secondary | ICD-10-CM | POA: Diagnosis not present

## 2023-12-04 DIAGNOSIS — E1151 Type 2 diabetes mellitus with diabetic peripheral angiopathy without gangrene: Secondary | ICD-10-CM | POA: Diagnosis not present

## 2023-12-04 DIAGNOSIS — Z9483 Pancreas transplant status: Secondary | ICD-10-CM | POA: Diagnosis not present

## 2023-12-04 DIAGNOSIS — N179 Acute kidney failure, unspecified: Secondary | ICD-10-CM | POA: Diagnosis not present

## 2023-12-04 DIAGNOSIS — N189 Chronic kidney disease, unspecified: Secondary | ICD-10-CM | POA: Diagnosis not present

## 2023-12-04 DIAGNOSIS — I69320 Aphasia following cerebral infarction: Secondary | ICD-10-CM | POA: Diagnosis not present

## 2023-12-05 DIAGNOSIS — I69320 Aphasia following cerebral infarction: Secondary | ICD-10-CM | POA: Diagnosis not present

## 2023-12-05 DIAGNOSIS — N390 Urinary tract infection, site not specified: Secondary | ICD-10-CM | POA: Diagnosis not present

## 2023-12-05 DIAGNOSIS — N189 Chronic kidney disease, unspecified: Secondary | ICD-10-CM | POA: Diagnosis not present

## 2023-12-05 DIAGNOSIS — Z9483 Pancreas transplant status: Secondary | ICD-10-CM | POA: Diagnosis not present

## 2023-12-05 DIAGNOSIS — E785 Hyperlipidemia, unspecified: Secondary | ICD-10-CM | POA: Diagnosis not present

## 2023-12-05 DIAGNOSIS — Z87891 Personal history of nicotine dependence: Secondary | ICD-10-CM | POA: Diagnosis not present

## 2023-12-05 DIAGNOSIS — Z940 Kidney transplant status: Secondary | ICD-10-CM | POA: Diagnosis not present

## 2023-12-05 DIAGNOSIS — I129 Hypertensive chronic kidney disease with stage 1 through stage 4 chronic kidney disease, or unspecified chronic kidney disease: Secondary | ICD-10-CM | POA: Diagnosis not present

## 2023-12-05 DIAGNOSIS — I10 Essential (primary) hypertension: Secondary | ICD-10-CM | POA: Diagnosis not present

## 2023-12-05 DIAGNOSIS — E1151 Type 2 diabetes mellitus with diabetic peripheral angiopathy without gangrene: Secondary | ICD-10-CM | POA: Diagnosis not present

## 2023-12-05 DIAGNOSIS — R339 Retention of urine, unspecified: Secondary | ICD-10-CM | POA: Diagnosis not present

## 2023-12-05 DIAGNOSIS — I69392 Facial weakness following cerebral infarction: Secondary | ICD-10-CM | POA: Diagnosis not present

## 2023-12-05 DIAGNOSIS — Z7902 Long term (current) use of antithrombotics/antiplatelets: Secondary | ICD-10-CM | POA: Diagnosis not present

## 2023-12-05 DIAGNOSIS — Z89512 Acquired absence of left leg below knee: Secondary | ICD-10-CM | POA: Diagnosis not present

## 2023-12-05 DIAGNOSIS — N179 Acute kidney failure, unspecified: Secondary | ICD-10-CM | POA: Diagnosis not present

## 2023-12-05 DIAGNOSIS — E1122 Type 2 diabetes mellitus with diabetic chronic kidney disease: Secondary | ICD-10-CM | POA: Diagnosis not present

## 2023-12-05 DIAGNOSIS — Z7952 Long term (current) use of systemic steroids: Secondary | ICD-10-CM | POA: Diagnosis not present

## 2023-12-05 DIAGNOSIS — Z604 Social exclusion and rejection: Secondary | ICD-10-CM | POA: Diagnosis not present

## 2023-12-05 DIAGNOSIS — Z89511 Acquired absence of right leg below knee: Secondary | ICD-10-CM | POA: Diagnosis not present

## 2023-12-05 DIAGNOSIS — K219 Gastro-esophageal reflux disease without esophagitis: Secondary | ICD-10-CM | POA: Diagnosis not present

## 2023-12-05 DIAGNOSIS — Z7982 Long term (current) use of aspirin: Secondary | ICD-10-CM | POA: Diagnosis not present

## 2023-12-05 DIAGNOSIS — Z9181 History of falling: Secondary | ICD-10-CM | POA: Diagnosis not present

## 2023-12-08 ENCOUNTER — Encounter: Payer: 59 | Admitting: Physical Medicine & Rehabilitation

## 2023-12-11 ENCOUNTER — Telehealth: Payer: Self-pay | Admitting: Internal Medicine

## 2023-12-11 DIAGNOSIS — I69320 Aphasia following cerebral infarction: Secondary | ICD-10-CM | POA: Diagnosis not present

## 2023-12-11 DIAGNOSIS — N179 Acute kidney failure, unspecified: Secondary | ICD-10-CM | POA: Diagnosis not present

## 2023-12-11 DIAGNOSIS — Z7902 Long term (current) use of antithrombotics/antiplatelets: Secondary | ICD-10-CM | POA: Diagnosis not present

## 2023-12-11 DIAGNOSIS — N189 Chronic kidney disease, unspecified: Secondary | ICD-10-CM | POA: Diagnosis not present

## 2023-12-11 DIAGNOSIS — Z87891 Personal history of nicotine dependence: Secondary | ICD-10-CM | POA: Diagnosis not present

## 2023-12-11 DIAGNOSIS — N390 Urinary tract infection, site not specified: Secondary | ICD-10-CM | POA: Diagnosis not present

## 2023-12-11 DIAGNOSIS — E785 Hyperlipidemia, unspecified: Secondary | ICD-10-CM | POA: Diagnosis not present

## 2023-12-11 DIAGNOSIS — Z9483 Pancreas transplant status: Secondary | ICD-10-CM | POA: Diagnosis not present

## 2023-12-11 DIAGNOSIS — E1122 Type 2 diabetes mellitus with diabetic chronic kidney disease: Secondary | ICD-10-CM | POA: Diagnosis not present

## 2023-12-11 DIAGNOSIS — Z604 Social exclusion and rejection: Secondary | ICD-10-CM | POA: Diagnosis not present

## 2023-12-11 DIAGNOSIS — E1151 Type 2 diabetes mellitus with diabetic peripheral angiopathy without gangrene: Secondary | ICD-10-CM | POA: Diagnosis not present

## 2023-12-11 DIAGNOSIS — Z9181 History of falling: Secondary | ICD-10-CM | POA: Diagnosis not present

## 2023-12-11 DIAGNOSIS — R339 Retention of urine, unspecified: Secondary | ICD-10-CM | POA: Diagnosis not present

## 2023-12-11 DIAGNOSIS — Z89512 Acquired absence of left leg below knee: Secondary | ICD-10-CM | POA: Diagnosis not present

## 2023-12-11 DIAGNOSIS — I10 Essential (primary) hypertension: Secondary | ICD-10-CM | POA: Diagnosis not present

## 2023-12-11 DIAGNOSIS — I129 Hypertensive chronic kidney disease with stage 1 through stage 4 chronic kidney disease, or unspecified chronic kidney disease: Secondary | ICD-10-CM | POA: Diagnosis not present

## 2023-12-11 DIAGNOSIS — Z7982 Long term (current) use of aspirin: Secondary | ICD-10-CM | POA: Diagnosis not present

## 2023-12-11 DIAGNOSIS — Z7952 Long term (current) use of systemic steroids: Secondary | ICD-10-CM | POA: Diagnosis not present

## 2023-12-11 DIAGNOSIS — I69392 Facial weakness following cerebral infarction: Secondary | ICD-10-CM | POA: Diagnosis not present

## 2023-12-11 DIAGNOSIS — Z940 Kidney transplant status: Secondary | ICD-10-CM | POA: Diagnosis not present

## 2023-12-11 DIAGNOSIS — K219 Gastro-esophageal reflux disease without esophagitis: Secondary | ICD-10-CM | POA: Diagnosis not present

## 2023-12-11 DIAGNOSIS — Z89511 Acquired absence of right leg below knee: Secondary | ICD-10-CM | POA: Diagnosis not present

## 2023-12-11 NOTE — Telephone Encounter (Signed)
Is it okay for me to give the Verbal Orders ?

## 2023-12-11 NOTE — Progress Notes (Signed)
Patient: [name] Date of Birth: 1976/02/08  Reason for Visit: Stroke clinic follow-up History from: Patient, wife Primary Neurologist: Pearlean Brownie   ASSESSMENT AND PLAN 47 y.o. 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  Complex history of PAD status post bilateral BKA, renal and pancreatic transplant.  Unwitnessed fall 10/07/2023 at inpatient rehab with subacute left MCA infarcts and small frontal hemorrhagic transformation etiology likely due to large vessel disease however left ICA occlusion recannulized now.  Vascular risk factors: HTN, HLD.  Has residual right-sided weakness, right-sided facial droop.  -Repeat Carotid US given left ICA complete stenosis then recannulization (via MRA head 10/26/23) but still high grade stenosis left ICA siphon -Avoid hypotension due to left ICA occlusion -Continue aspirin 81 and Plavix 75 mg daily for total 3 months, then aspirin 81 mg daily alone -Referral to ophthalmology for left-sided blurry vision post CVA -Strict management of vascular risk factors with a goal BP less than 130/90, A1c less than 7.0, LDL less than 70 for secondary stroke prevention -Continue work with home health therapies -Follow-up in 6 months with Dr. Pearlean Brownie   HISTORY OF PRESENT ILLNESS: Today 12/12/23 Here with his wife. Back at home with his wife. Doing PT, OT, ST, he is progressing. Can walk with rolling walker. Prior to stroke was independent, driving. Now, right side is weak, leg more than arm, right leg drags, is heavy. Speech has improved, about 60% back to normal, takes his time, sometimes use the wrong word, eating good, no trouble swallowing. Feels cognitively doing well. Remains on disability.remains on aspirin 81 and plavix 75 mg for 3 months then aspirin alone. Crestor 20 mg.  No side effects. Takes oxycodone for chronic pain. Today BP 134/71, running in the 130's at home. His wife is [name], [name] Has been to see PMR, will see Dr. Wynn Banker this week. No more IV infusion, getting cephalosporin oral daily. Seeing transplant team at [facility] after this.  Has had some blurry vision to his left eye post CVA.  I did not see any notes from vascular during hospitalization, but patient's wife reports with left ICA occlusion no intervention was suggested.  HISTORY  Significant history of HTN, type 2 diabetes, PAD post bilateral BKA status post renal and pancreatic transplant on chronic immunosuppressive therapy.  Presented to the ER with AMS.  He was at his infusion center receiving chronic immunosuppressive therapy.  During infusion he became hypotensive and had full body burning sensation.  Upon ER arrival he was minimally responsive, given IM epi with mild improvement.  He was emergently intubated.  Code stroke was activated due to facial droop.  Felt concerning for anaphylactic shock.  MRI of the brain showed scattered small foci of acute subcortical infarction in the left occipital lobe and posterior left frontal lobe.Faint diffusion signal abnormality along the left parietal lobe may reflect evolving infarct versus seizure related cytotoxic edema. 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  He had right hemiparesis with significant right arm weakness and mild right leg weakness.  Speech nonfluent but able to speak sentences and follow commands.  Carotid ultrasound confirmed left ICA occlusion.  He was discharged to inpatient rehab 09/30/2023.  On 10/12 he had an unwitnessed fall.  CT head showed evolving acute left frontal/parietal infarct, small volume hemorrhage.  10/14 MRI showed subacute infarct with petechial  hemorrhage and MRA showed left ICA now recannulized.  -CT head no acute intercranial hemorrhage or evidence of acute large vessel infarct. -MRI  brain/MRA head: scattered small foci of acute subcortical infarction in the left occipital lobe and posterior left frontal lobe. Faint diffusion signal abnormality along the left parietal lobe may reflect evolving infarct versus seizure related to cytotoxic edema. 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  -10/13/24New MRI of the brain showed new areas left MCA infarct compared with last MRI but now subacute 10/08/23 -New MRA showed left ICA recannulized from extracranial portion but still has high-grade stenosis at left ICA siphon -Carotid Doppler left ICA occlusion -2D echo EF 60 to 65%, moderate left ventricular hypertrophy -LDL 90, rosuvastatin was increased to 20 -A1c 5.8 -No antithrombotic prior to admission, now aspirin 81 daily and Plavix 75 daily for 3 months then aspirin alone -UDS was negative -EEG showed cortical dysfunction from the left hemisphere likely secondary to underlying stroke.  REVIEW OF SYSTEMS: Out of a complete 14 system review of symptoms, the patient complains only of the following symptoms, and all other reviewed systems are negative.  See HPI  ALLERGIES: Allergies  Allergen Reactions   Belatacept Anaphylaxis    hypotensive with altered reaction and required epinephrine.  Needed to be intubated for airway protection.   Amlodipine Rash   Lisinopril Rash    HOME MEDICATIONS: Outpatient Medications Prior to Visit  Medication Sig Dispense Refill   acetaminophen (TYLENOL) 325 MG tablet Take 1-2 tablets (325-650 mg total) by mouth every 4 (four) hours as needed for mild pain (pain score 1-3).     aspirin EC 81 MG tablet Take 1 tablet (81 mg total) by mouth daily. Swallow whole.     carvedilol (COREG) 12.5 MG tablet Take 1 tablet (12.5 mg total) by mouth 2 (two) times daily with a meal. 60 tablet 0   clopidogrel (PLAVIX) 75 MG tablet Take 1 tablet (75 mg total) by mouth daily. 30 tablet 0   cycloSPORINE modified (GENGRAF) 25 MG capsule  Take 4 capsules (100 mg total) by mouth 2 (two) times daily.     hydrALAZINE (APRESOLINE) 50 MG tablet Take 1 tablet (50 mg total) by mouth 2 (two) times daily.     mycophenolate (MYFORTIC) 180 MG EC tablet Take 1 tablet (180 mg total) by mouth 2 (two) times daily.     Oxycodone HCl 10 MG TABS Take 0.5-1 tablets (5-10 mg total) by mouth 3 (three) times daily as needed for pain. 63 tablet 0   Oxycodone HCl 10 MG TABS Take 0.5-1 tablets (5-10 mg total) by mouth 4 (four) times daily as needed. 120 tablet 0   pantoprazole (PROTONIX) 40 MG tablet Take 1 tablet (40 mg total) by mouth 2 (two) times daily. 60 tablet 0   potassium chloride (KLOR-CON M) 10 MEQ tablet Take 1 tablet (10 mEq total) by mouth daily. 30 tablet 0   predniSONE (DELTASONE) 5 MG tablet Take 5 mg by mouth daily.     rosuvastatin (CRESTOR) 20 MG tablet Take 1 tablet (20 mg total) by mouth daily. 30 tablet 0   sulfamethoxazole-trimethoprim (BACTRIM,SEPTRA) 400-80 MG per tablet Take 1 tablet by mouth every Monday, Wednesday, and Friday.      terazosin (HYTRIN) 2 MG capsule Take 2 capsules (4 mg total) by mouth at bedtime.     methocarbamol (ROBAXIN) 500 MG tablet Take 1 tablet (500 mg total) by mouth every 6 (six) hours as needed for muscle spasms. (  Patient not taking: Reported on 12/12/2023) 30 tablet 0   No facility-administered medications prior to visit.    PAST MEDICAL HISTORY: Past Medical History:  Diagnosis Date   AMPUTATION, BELOW KNEE, 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   Diabetes mellitus without complication [facility])    no since pancreas transplant   Dialysis patient [facility]) 04/18/2012   "[facility]; [place], [place], Sat"   Gastroparesis    Gastropathy    GERD (gastroesophageal reflux disease)    Hypertension    MRSA infection    over 10 years ago per  patient. in legs    PAST SURGICAL HISTORY: Past Surgical History:  Procedure Laterality Date   AV FISTULA PLACEMENT  08/2011   left upper arm   BELOW KNEE LEG AMPUTATION  "it's been awhile"   bilaterally   CATARACT EXTRACTION  ~ 2011   right   COMBINED KIDNEY-PANCREAS TRANSPLANT  2014   ESOPHAGOGASTRODUODENOSCOPY N/A 12/30/2016   Procedure: ESOPHAGOGASTRODUODENOSCOPY (EGD);  Surgeon: Jeani Hawking, MD;  Location: [facility] ENDOSCOPY;  Service: Endoscopy;  Laterality: N/A;   OLECRANON BURSECTOMY Right 06/23/2020   Procedure: RIGHT ELBOW EXCISION OLECRANON BURSITIS;  Surgeon: Nadara Mustard, MD;  Location: [facility];  Service: Orthopedics;  Laterality: Right;    FAMILY HISTORY: Family History  Problem Relation Age of Onset   Hypertension Mother    Diabetes Mother    Kidney disease Mother    Diabetes Maternal Grandmother    Diabetes Paternal Grandmother    Diabetes Other    Hypertension Other    Lung cancer Maternal Aunt    Colon cancer Neg Hx    Esophageal cancer Neg Hx    Rectal cancer Neg Hx    Stomach cancer Neg Hx     SOCIAL HISTORY: Social History   Socioeconomic History   Marital status: Married    Spouse name: Not on file   Number of children: 1   Years of education: 11   Highest education level: Not on file  Occupational History   Occupation: Disability  Tobacco Use   Smoking status: Former    Types: Cigars    Quit date: 07/06/2022    Years since quitting: 1.4   Smokeless tobacco: Never   Tobacco comments:    black and mild  Vaping Use   Vaping status: Never Used  Substance and Sexual Activity   Alcohol use: No   Drug use: Not Currently    Frequency: 3.0 times per week    Types: Marijuana    Comment: Occasionally   Sexual activity: Yes    Partners: Female  Other Topics Concern   Not on file  Social History Narrative   Fun: Restore old cars    Social Drivers of Health   Financial Resource Strain: Low Risk  (09/11/2023)   Overall  Financial Resource Strain (CARDIA)    Difficulty of Paying Living Expenses: Not hard at all  Food Insecurity: No Food Insecurity (09/29/2023)   Hunger Vital Sign    Worried About Running Out of Food in the Last Year: Never true    Ran Out of Food in the Last Year: Never true  Transportation Needs: No Transportation Needs (09/20/2023)   PRAPARE - Transportation    Lack of  Transportation (Medical): No    Lack of Transportation (Non-Medical): No  Physical Activity: Inactive (09/11/2023)   Exercise Vital Sign    Days of Exercise per Week: 0 days    Minutes of Exercise per Session: 0 min  Stress: No Stress Concern Present (09/11/2023)   [employer] of Occupational Health - Occupational Stress Questionnaire    Feeling of Stress : Not at all  Social Connections: Socially Integrated (09/11/2023)   Social Connection and Isolation Panel [NHANES]    Frequency of Communication with Friends and Family: More than three times a week    Frequency of Social Gatherings with Friends and Family: More than three times a week    Attends Religious Services: More than 4 times per year    Active Member of [employer] or Organizations: Yes    Attends Engineer, structural: More than 4 times per year    Marital Status: Married  Catering manager Violence: Patient Unable To Answer (09/20/2023)   Humiliation, Afraid, Rape, and Kick questionnaire    Fear of Current or Ex-Partner: Patient unable to answer    Emotionally Abused: Patient unable to answer    Physically Abused: Patient unable to answer    Sexually Abused: Patient unable to answer   PHYSICAL EXAM  Vitals:   12/12/23 0844  BP: 134/71  Pulse: 66  Weight: 217 lb (98.4 kg)  Height: 5\' 10"  (1.778 m)   Body mass index is 31.14 kg/m.  Generalized: Well developed, in no acute distress  Neurological examination  Mentation: Alert oriented to time, place, history taking. Follows all commands, speech is clear but fluency is slow Cranial nerve  II-XII: Pupils were equal round reactive to light. Extraocular movements were full, visual field were full on confrontational test.  Mild right-sided facial droop. Head turning and shoulder shrug  were normal and symmetric. Motor: 3/5 right upper extremity, bilateral BKA Sensory: Sensory testing is intact to soft touch on all 4 extremities. No evidence of extinction is noted.  Coordination: Dysmetria with finger-nose-finger on the right Gait and station: Can stand independently with pushoff from wheelchair, few steps are unsteady.  Reflexes: Deep tendon reflexes are symmetric   DIAGNOSTIC DATA (LABS, IMAGING, TESTING) - I reviewed patient records, labs, notes, testing and imaging myself where available.  Lab Results  Component Value Date   WBC 7.4 10/16/2023   HGB 13.5 10/16/2023   HCT 43.2 10/16/2023   MCV 97.1 10/16/2023   PLT 263 10/16/2023      Component Value Date/Time   NA 137 10/16/2023 0540   NA 139 09/26/2021 0000   K 4.3 10/16/2023 0540   CL 107 10/16/2023 0540   CO2 19 (L) 10/16/2023 0540   GLUCOSE 143 (H) 10/16/2023 0540   BUN 37 (H) 10/16/2023 0540   BUN 21 09/26/2021 0000   CREATININE 2.44 (H) 10/16/2023 0540   CREATININE 1.20 05/30/2014 1415   CALCIUM 9.5 10/16/2023 0540   CALCIUM 8.4 09/08/2011 0746   PROT 6.2 (L) 09/30/2023 1010   ALBUMIN 2.8 (L) 10/15/2023 0713   AST 20 09/30/2023 1010   ALT 25 09/30/2023 1010   ALKPHOS 67 09/30/2023 1010   BILITOT 0.3 09/30/2023 1010   GFRNONAA 32 (L) 10/16/2023 0540   GFRAA 51 09/26/2021 0000   Lab Results  Component Value Date   CHOL 173 07/20/2023   HDL 45.00 07/20/2023   LDLCALC 90 07/20/2023   LDLDIRECT 116 (H) 01/21/2011   TRIG 126 09/25/2023   CHOLHDL 4 07/20/2023   Lab  Results  Component Value Date   HGBA1C 5.8 09/12/2023   Lab Results  Component Value Date   VITAMINB12 545 02/01/2008   Lab Results  Component Value Date   TSH 0.978 09/19/2023    Margie Ege, AGNP-C, DNP 12/12/2023, 9:14  AM [facility] [address] [place], [place] [address] [phone]

## 2023-12-11 NOTE — Telephone Encounter (Signed)
Caller [employer]:  [name] with [facility]   Phone Number:  [phone]   Needs Verbal orders for what service & frequency:  Extend speech therapy once a week for 5 weeks

## 2023-12-11 NOTE — Telephone Encounter (Signed)
 Verbal Orders has been given.

## 2023-12-12 ENCOUNTER — Ambulatory Visit (INDEPENDENT_AMBULATORY_CARE_PROVIDER_SITE_OTHER): Payer: 59 | Admitting: Neurology

## 2023-12-12 ENCOUNTER — Encounter: Payer: Self-pay | Admitting: Neurology

## 2023-12-12 ENCOUNTER — Inpatient Hospital Stay: Payer: 59 | Admitting: Adult Health

## 2023-12-12 VITALS — BP 134/71 | HR 66 | Ht 70.0 in | Wt 217.0 lb

## 2023-12-12 DIAGNOSIS — Z940 Kidney transplant status: Secondary | ICD-10-CM | POA: Diagnosis not present

## 2023-12-12 DIAGNOSIS — I10 Essential (primary) hypertension: Secondary | ICD-10-CM | POA: Diagnosis not present

## 2023-12-12 DIAGNOSIS — I151 Hypertension secondary to other renal disorders: Secondary | ICD-10-CM

## 2023-12-12 DIAGNOSIS — D84821 Immunodeficiency due to drugs: Secondary | ICD-10-CM | POA: Diagnosis not present

## 2023-12-12 DIAGNOSIS — Z5181 Encounter for therapeutic drug level monitoring: Secondary | ICD-10-CM | POA: Diagnosis not present

## 2023-12-12 DIAGNOSIS — Z89512 Acquired absence of left leg below knee: Secondary | ICD-10-CM | POA: Diagnosis not present

## 2023-12-12 DIAGNOSIS — I639 Cerebral infarction, unspecified: Secondary | ICD-10-CM

## 2023-12-12 DIAGNOSIS — Z8673 Personal history of transient ischemic attack (TIA), and cerebral infarction without residual deficits: Secondary | ICD-10-CM | POA: Diagnosis not present

## 2023-12-12 DIAGNOSIS — K219 Gastro-esophageal reflux disease without esophagitis: Secondary | ICD-10-CM | POA: Diagnosis not present

## 2023-12-12 DIAGNOSIS — I739 Peripheral vascular disease, unspecified: Secondary | ICD-10-CM | POA: Diagnosis not present

## 2023-12-12 DIAGNOSIS — Z79621 Long term (current) use of calcineurin inhibitor: Secondary | ICD-10-CM | POA: Diagnosis not present

## 2023-12-12 DIAGNOSIS — Z89511 Acquired absence of right leg below knee: Secondary | ICD-10-CM | POA: Diagnosis not present

## 2023-12-12 DIAGNOSIS — Z9483 Pancreas transplant status: Secondary | ICD-10-CM | POA: Diagnosis not present

## 2023-12-12 DIAGNOSIS — Z79899 Other long term (current) drug therapy: Secondary | ICD-10-CM | POA: Diagnosis not present

## 2023-12-12 DIAGNOSIS — Z48288 Encounter for aftercare following multiple organ transplant: Secondary | ICD-10-CM | POA: Diagnosis not present

## 2023-12-12 DIAGNOSIS — D849 Immunodeficiency, unspecified: Secondary | ICD-10-CM | POA: Diagnosis not present

## 2023-12-12 NOTE — Patient Instructions (Addendum)
Continue work with home health therapy Recheck carotid ultrasound Referral to Dr. Dione Booze Continue aspirin 81 mg and Plavix 75 mg daily for 3 months then aspirin alone Strict management of vascular risk factors with a goal BP less than 130/90, A1c less than 7.0, LDL less than 70 for secondary stroke prevention  Follow up in 6 months

## 2023-12-12 NOTE — Progress Notes (Signed)
I agree with the above plan 

## 2023-12-14 ENCOUNTER — Telehealth: Payer: Self-pay | Admitting: Neurology

## 2023-12-14 ENCOUNTER — Encounter: Payer: 59 | Attending: Physical Medicine & Rehabilitation | Admitting: Physical Medicine & Rehabilitation

## 2023-12-14 ENCOUNTER — Other Ambulatory Visit: Payer: Self-pay | Admitting: Physical Medicine & Rehabilitation

## 2023-12-14 ENCOUNTER — Encounter: Payer: Self-pay | Admitting: Physical Medicine & Rehabilitation

## 2023-12-14 DIAGNOSIS — Z9181 History of falling: Secondary | ICD-10-CM | POA: Diagnosis not present

## 2023-12-14 DIAGNOSIS — N179 Acute kidney failure, unspecified: Secondary | ICD-10-CM | POA: Diagnosis not present

## 2023-12-14 DIAGNOSIS — Z9483 Pancreas transplant status: Secondary | ICD-10-CM | POA: Diagnosis not present

## 2023-12-14 DIAGNOSIS — N189 Chronic kidney disease, unspecified: Secondary | ICD-10-CM | POA: Diagnosis not present

## 2023-12-14 DIAGNOSIS — I10 Essential (primary) hypertension: Secondary | ICD-10-CM | POA: Insufficient documentation

## 2023-12-14 DIAGNOSIS — I151 Hypertension secondary to other renal disorders: Secondary | ICD-10-CM | POA: Diagnosis not present

## 2023-12-14 DIAGNOSIS — N390 Urinary tract infection, site not specified: Secondary | ICD-10-CM | POA: Diagnosis not present

## 2023-12-14 DIAGNOSIS — E1151 Type 2 diabetes mellitus with diabetic peripheral angiopathy without gangrene: Secondary | ICD-10-CM | POA: Diagnosis not present

## 2023-12-14 DIAGNOSIS — Z940 Kidney transplant status: Secondary | ICD-10-CM | POA: Diagnosis not present

## 2023-12-14 DIAGNOSIS — E785 Hyperlipidemia, unspecified: Secondary | ICD-10-CM | POA: Insufficient documentation

## 2023-12-14 DIAGNOSIS — Z7952 Long term (current) use of systemic steroids: Secondary | ICD-10-CM | POA: Diagnosis not present

## 2023-12-14 DIAGNOSIS — I69320 Aphasia following cerebral infarction: Secondary | ICD-10-CM | POA: Diagnosis not present

## 2023-12-14 DIAGNOSIS — Z604 Social exclusion and rejection: Secondary | ICD-10-CM | POA: Diagnosis not present

## 2023-12-14 DIAGNOSIS — I69392 Facial weakness following cerebral infarction: Secondary | ICD-10-CM | POA: Diagnosis not present

## 2023-12-14 DIAGNOSIS — I129 Hypertensive chronic kidney disease with stage 1 through stage 4 chronic kidney disease, or unspecified chronic kidney disease: Secondary | ICD-10-CM | POA: Diagnosis not present

## 2023-12-14 DIAGNOSIS — E1122 Type 2 diabetes mellitus with diabetic chronic kidney disease: Secondary | ICD-10-CM | POA: Diagnosis not present

## 2023-12-14 DIAGNOSIS — R339 Retention of urine, unspecified: Secondary | ICD-10-CM | POA: Diagnosis not present

## 2023-12-14 DIAGNOSIS — Z7982 Long term (current) use of aspirin: Secondary | ICD-10-CM | POA: Diagnosis not present

## 2023-12-14 DIAGNOSIS — Z7902 Long term (current) use of antithrombotics/antiplatelets: Secondary | ICD-10-CM | POA: Diagnosis not present

## 2023-12-14 DIAGNOSIS — Z89511 Acquired absence of right leg below knee: Secondary | ICD-10-CM | POA: Diagnosis not present

## 2023-12-14 DIAGNOSIS — Z87891 Personal history of nicotine dependence: Secondary | ICD-10-CM | POA: Diagnosis not present

## 2023-12-14 DIAGNOSIS — Z89512 Acquired absence of left leg below knee: Secondary | ICD-10-CM | POA: Diagnosis not present

## 2023-12-14 DIAGNOSIS — K219 Gastro-esophageal reflux disease without esophagitis: Secondary | ICD-10-CM | POA: Diagnosis not present

## 2023-12-14 MED ORDER — PANTOPRAZOLE SODIUM 40 MG PO TBEC: 40.0000 mg | DELAYED_RELEASE_TABLET | Freq: Two times a day (BID) | ORAL | 0 refills | Status: DC

## 2023-12-14 MED ORDER — POTASSIUM CHLORIDE CRYS ER 10 MEQ PO TBCR: 10.0000 meq | EXTENDED_RELEASE_TABLET | Freq: Every day | ORAL | 0 refills | Status: DC

## 2023-12-14 MED ORDER — CARVEDILOL 12.5 MG PO TABS: 12.5000 mg | ORAL_TABLET | Freq: Two times a day (BID) | ORAL | 0 refills | Status: DC

## 2023-12-14 MED ORDER — TERAZOSIN HCL 2 MG PO CAPS: 4.0000 mg | ORAL_CAPSULE | Freq: Every day | ORAL | Status: DC

## 2023-12-14 MED ORDER — CLOPIDOGREL BISULFATE 75 MG PO TABS: 75.0000 mg | ORAL_TABLET | Freq: Every day | ORAL | 0 refills | Status: DC

## 2023-12-14 MED ORDER — HYDRALAZINE HCL 50 MG PO TABS: 50.0000 mg | ORAL_TABLET | Freq: Two times a day (BID) | ORAL | Status: DC

## 2023-12-14 MED ORDER — ROSUVASTATIN CALCIUM 20 MG PO TABS: 20.0000 mg | ORAL_TABLET | Freq: Every day | ORAL | 0 refills | Status: DC

## 2023-12-14 NOTE — Telephone Encounter (Signed)
Referral for ophthalmology fax to [facility]. Phone: [phone], Fax: [phone]. 

## 2023-12-14 NOTE — Patient Instructions (Signed)
Please stop clopidigrel on 01/07/2024

## 2023-12-14 NOTE — Progress Notes (Signed)
Subjective:    Patient ID: [name], male    DOB: 10-05-1976, 47 y.o.   MRN: [id number] 47 y.o. 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 Patient was at his infusion center today getting his Belatacept infusion for chronic immunosuppressive therapy and during his infusion he became hypotensive and had full body burning sensation. Patient did receive Benadryl at the infusion center and patient was transported to the [facility] for further evaluation and management. On initial arrival to the emergency department patient was altered, and had blood pressure of 99/86. Patient was altered, and minimally responsive. Patient was given IM epinephrine with mild improvement in mental status. Patient was mumbling and nonsensical, and therefore with concern for not protecting his own airway, patient was emergently intubated. Patient subsequently had code stroke called. Patient was admitted to the ICU for further evaluation management of altered mental status. Treated for E. coli UTI/bacteremia. Continues to have right hemiparesis with significant right arm weakness and mild right leg weakness. He is speech is improving but remains nonfluent but able to speak sentences and follows commands well. 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 Extubated 9/26. Nausea treated with scheduled Reglan. AKI atop CKD II-IIIa. Nephrology consulted 9/30. Immunosuppressant drug regimen reviewed/adjusted. Good UOP and no significant PVRs. Tolerating diet. Patient requiring mod A bed mobility then mod x 2 to stand to STEDY then min A of 2 for safety. Participating with multiple STS and standing weight shifting with therapist assist R UE and R knee ext. Decreased  awareness of R sided deficits    HPI Patient without new issues since discharge from hospital.  Has follow-up with nephrology.  Restarted on immunosuppressive medications for his kidney transplant. Wife is wondering when they will get permanent wheelchair currently has loaner.  He had his wheelchair ordered through [employer].  According to physical therapy and Occupational Therapy this usually takes 6 months. Patient has had no falls.  His speech is improving according to patient and wife.  He is able to walk up with a walker but needs some assistance going downstairs because of right leg weakness.  He has had bilateral BKA's. Pain Inventory Average Pain 9 Pain Right Now 8 My pain is constant and aching  In the last 24 hours, has pain interfered with the following? General activity 4 Relation with others 4 Enjoyment of life 7 What TIME of day is your pain at its worst? morning , daytime, evening, and night Sleep (in general) Fair  Pain is worse with: walking, standing, and some activites Pain improves with: rest and medication Relief from Meds: 5  Family History  Problem Relation Age of Onset   Hypertension Mother    Diabetes Mother    Kidney disease Mother    Diabetes Maternal Grandmother    Diabetes Paternal Grandmother    Diabetes Other    Hypertension Other    Lung cancer Maternal Aunt    Colon cancer Neg Hx    Esophageal cancer Neg Hx    Rectal cancer Neg Hx    Stomach cancer Neg Hx    Social History   Socioeconomic History   Marital status: Married    Spouse name: Not on file   Number of children: 1   Years of education: 11   Highest  education level: Not on file  Occupational History   Occupation: Disability  Tobacco Use   Smoking status: Former    Types: Cigars    Quit date: 07/06/2022    Years since quitting: 1.4   Smokeless tobacco: Never   Tobacco comments:    black and mild  Vaping Use   Vaping status: Never Used  Substance and Sexual Activity    Alcohol use: No   Drug use: Not Currently    Frequency: 3.0 times per week    Types: Marijuana    Comment: Occasionally   Sexual activity: Yes    Partners: Female  Other Topics Concern   Not on file  Social History Narrative   Fun: Restore old cars    Social Drivers of Health   Financial Resource Strain: Low Risk  (09/11/2023)   Overall Financial Resource Strain (CARDIA)    Difficulty of Paying Living Expenses: Not hard at all  Food Insecurity: No Food Insecurity (09/29/2023)   Hunger Vital Sign    Worried About Running Out of Food in the Last Year: Never true    Ran Out of Food in the Last Year: Never true  Transportation Needs: No Transportation Needs (09/20/2023)   PRAPARE - Administrator, Civil Service (Medical): No    Lack of Transportation (Non-Medical): No  Physical Activity: Inactive (09/11/2023)   Exercise Vital Sign    Days of Exercise per Week: 0 days    Minutes of Exercise per Session: 0 min  Stress: No Stress Concern Present (09/11/2023)   [employer] of Occupational Health - Occupational Stress Questionnaire    Feeling of Stress : Not at all  Social Connections: Socially Integrated (09/11/2023)   Social Connection and Isolation Panel [NHANES]    Frequency of Communication with Friends and Family: More than three times a week    Frequency of Social Gatherings with Friends and Family: More than three times a week    Attends Religious Services: More than 4 times per year    Active Member of Clubs or Organizations: Yes    Attends Engineer, structural: More than 4 times per year    Marital Status: Married   Past Surgical History:  Procedure Laterality Date   AV FISTULA PLACEMENT  08/2011   left upper arm   BELOW KNEE LEG AMPUTATION  "it's been awhile"   bilaterally   CATARACT EXTRACTION  ~ 2011   right   COMBINED KIDNEY-PANCREAS TRANSPLANT  2014   ESOPHAGOGASTRODUODENOSCOPY N/A 12/30/2016   Procedure: ESOPHAGOGASTRODUODENOSCOPY (EGD);   Surgeon: Jeani Hawking, MD;  Location: [facility] ENDOSCOPY;  Service: Endoscopy;  Laterality: N/A;   OLECRANON BURSECTOMY Right 06/23/2020   Procedure: RIGHT ELBOW EXCISION OLECRANON BURSITIS;  Surgeon: Nadara Mustard, MD;  Location: [facility];  Service: Orthopedics;  Laterality: Right;   Past Surgical History:  Procedure Laterality Date   AV FISTULA PLACEMENT  08/2011   left upper arm   BELOW KNEE LEG AMPUTATION  "it's been awhile"   bilaterally   CATARACT EXTRACTION  ~ 2011   right   COMBINED KIDNEY-PANCREAS TRANSPLANT  2014   ESOPHAGOGASTRODUODENOSCOPY N/A 12/30/2016   Procedure: ESOPHAGOGASTRODUODENOSCOPY (EGD);  Surgeon: Jeani Hawking, MD;  Location: [facility] ENDOSCOPY;  Service: Endoscopy;  Laterality: N/A;   OLECRANON BURSECTOMY Right 06/23/2020   Procedure: RIGHT ELBOW EXCISION OLECRANON BURSITIS;  Surgeon: Nadara Mustard, MD;  Location: [facility];  Service: Orthopedics;  Laterality: Right;   Past Medical History:  Diagnosis  Date   AMPUTATION, BELOW KNEE, 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   Diabetes mellitus without complication [facility])    no since pancreas transplant   Dialysis patient [facility]) 04/18/2012   "[facility]; [place], [place], Sat"   Gastroparesis    Gastropathy    GERD (gastroesophageal reflux disease)    Hypertension    MRSA infection    over 10 years ago per patient. in legs   BP (!) 157/75   Pulse 67   Ht 6\' 1"  (1.854 m)   Wt 217 lb (98.4 kg)   SpO2 98%   BMI 28.63 kg/m   Opioid Risk Score:   Fall Risk Score:  `1  Depression screen [facility] 2/9     10/31/2023   11:15 AM 09/11/2023    3:36 PM 09/06/2022    4:08 PM 08/26/2016   10:04 AM 04/22/2016    9:41 AM 12/14/2015   11:56 AM 07/29/2014    1:56 PM  Depression screen PHQ 2/9  Decreased Interest 0 0 0 0 0 0 0  Down,  Depressed, Hopeless 0 0 0 0 0 0 0  PHQ - 2 Score 0 0 0 0 0 0 0  Altered sleeping 0 0       Tired, decreased energy 0 0       Change in appetite 0 0       Feeling bad or failure about yourself  0 0       Trouble concentrating 0 0       Moving slowly or fidgety/restless 1 0       Suicidal thoughts 0 0       PHQ-9 Score 1 0       Difficult doing work/chores Somewhat difficult Not difficult at all          Review of Systems  Musculoskeletal:  Positive for back pain.       Bilateral knee pain Right arm pain  All other systems reviewed and are negative.     Objective:   Physical Exam  General no acute distress Mood and affect are appropriate Motor strength is 4/5 in the right deltoid, bicep, tricep, grip, hip flexor, knee extensor Left side is 5/5 in the deltoid bicep tricep grip hip flexor knee extensor Speech no evidence of dysarthria Is able to answer yes/no questions accurately.  The patient is able to name gloves and glasses without difficulty. Gait not tested secondary to lack of walker      Assessment & Plan:   1.  Left ICA distribution infarct hypotensive episode associated with bacteremia and sepsis.  Watershed type infarcts.  He will stop Plavix on 01/07/2024 and continue with aspirin 81 mg/day 2.  Bilateral BKA follow-up with [facility] for adjustments as needed Functional deficits related to stroke continue home health PT OT speech. Physical medicine rehab follow-up in 4 weeks

## 2023-12-15 ENCOUNTER — Other Ambulatory Visit: Payer: Self-pay

## 2023-12-15 DIAGNOSIS — N390 Urinary tract infection, site not specified: Secondary | ICD-10-CM | POA: Diagnosis not present

## 2023-12-15 DIAGNOSIS — Z89512 Acquired absence of left leg below knee: Secondary | ICD-10-CM | POA: Diagnosis not present

## 2023-12-15 DIAGNOSIS — G890 Central pain syndrome: Secondary | ICD-10-CM | POA: Diagnosis not present

## 2023-12-15 DIAGNOSIS — E785 Hyperlipidemia, unspecified: Secondary | ICD-10-CM | POA: Diagnosis not present

## 2023-12-15 DIAGNOSIS — I69392 Facial weakness following cerebral infarction: Secondary | ICD-10-CM | POA: Diagnosis not present

## 2023-12-15 DIAGNOSIS — N189 Chronic kidney disease, unspecified: Secondary | ICD-10-CM | POA: Diagnosis not present

## 2023-12-15 DIAGNOSIS — R339 Retention of urine, unspecified: Secondary | ICD-10-CM | POA: Diagnosis not present

## 2023-12-15 DIAGNOSIS — E1151 Type 2 diabetes mellitus with diabetic peripheral angiopathy without gangrene: Secondary | ICD-10-CM | POA: Diagnosis not present

## 2023-12-15 DIAGNOSIS — Z7902 Long term (current) use of antithrombotics/antiplatelets: Secondary | ICD-10-CM | POA: Diagnosis not present

## 2023-12-15 DIAGNOSIS — Z9483 Pancreas transplant status: Secondary | ICD-10-CM | POA: Diagnosis not present

## 2023-12-15 DIAGNOSIS — Z940 Kidney transplant status: Secondary | ICD-10-CM | POA: Diagnosis not present

## 2023-12-15 DIAGNOSIS — I10 Essential (primary) hypertension: Secondary | ICD-10-CM | POA: Diagnosis not present

## 2023-12-15 DIAGNOSIS — N179 Acute kidney failure, unspecified: Secondary | ICD-10-CM | POA: Diagnosis not present

## 2023-12-15 DIAGNOSIS — G8191 Hemiplegia, unspecified affecting right dominant side: Secondary | ICD-10-CM | POA: Diagnosis not present

## 2023-12-15 DIAGNOSIS — I129 Hypertensive chronic kidney disease with stage 1 through stage 4 chronic kidney disease, or unspecified chronic kidney disease: Secondary | ICD-10-CM | POA: Diagnosis not present

## 2023-12-15 DIAGNOSIS — K219 Gastro-esophageal reflux disease without esophagitis: Secondary | ICD-10-CM | POA: Diagnosis not present

## 2023-12-15 DIAGNOSIS — N1831 Chronic kidney disease, stage 3a: Secondary | ICD-10-CM | POA: Diagnosis not present

## 2023-12-15 DIAGNOSIS — Z9181 History of falling: Secondary | ICD-10-CM | POA: Diagnosis not present

## 2023-12-15 DIAGNOSIS — Z79899 Other long term (current) drug therapy: Secondary | ICD-10-CM | POA: Diagnosis not present

## 2023-12-15 DIAGNOSIS — Z87891 Personal history of nicotine dependence: Secondary | ICD-10-CM | POA: Diagnosis not present

## 2023-12-15 DIAGNOSIS — Z604 Social exclusion and rejection: Secondary | ICD-10-CM | POA: Diagnosis not present

## 2023-12-15 DIAGNOSIS — I69320 Aphasia following cerebral infarction: Secondary | ICD-10-CM | POA: Diagnosis not present

## 2023-12-15 DIAGNOSIS — S88119A Complete traumatic amputation at level between knee and ankle, unspecified lower leg, initial encounter: Secondary | ICD-10-CM | POA: Diagnosis not present

## 2023-12-15 DIAGNOSIS — Z7952 Long term (current) use of systemic steroids: Secondary | ICD-10-CM | POA: Diagnosis not present

## 2023-12-15 DIAGNOSIS — Z7982 Long term (current) use of aspirin: Secondary | ICD-10-CM | POA: Diagnosis not present

## 2023-12-15 DIAGNOSIS — Z89511 Acquired absence of right leg below knee: Secondary | ICD-10-CM | POA: Diagnosis not present

## 2023-12-15 DIAGNOSIS — G546 Phantom limb syndrome with pain: Secondary | ICD-10-CM | POA: Diagnosis not present

## 2023-12-15 DIAGNOSIS — E1122 Type 2 diabetes mellitus with diabetic chronic kidney disease: Secondary | ICD-10-CM | POA: Diagnosis not present

## 2023-12-15 MED ORDER — OXYCODONE HCL 10 MG PO TABS
5.0000 mg | ORAL_TABLET | Freq: Four times a day (QID) | ORAL | 0 refills | Status: DC | PRN
  Filled 2023-12-18 (×2): qty 60, 30d supply, fill #0
  Filled 2023-12-19: qty 2, 1d supply, fill #0
  Filled 2023-12-19: qty 118, 29d supply, fill #0
  Filled 2023-12-19 (×2): qty 60, 30d supply, fill #1

## 2023-12-18 ENCOUNTER — Other Ambulatory Visit: Payer: Self-pay

## 2023-12-18 DIAGNOSIS — Z9483 Pancreas transplant status: Secondary | ICD-10-CM | POA: Diagnosis not present

## 2023-12-18 DIAGNOSIS — I69320 Aphasia following cerebral infarction: Secondary | ICD-10-CM | POA: Diagnosis not present

## 2023-12-18 DIAGNOSIS — I10 Essential (primary) hypertension: Secondary | ICD-10-CM | POA: Diagnosis not present

## 2023-12-18 DIAGNOSIS — Z89512 Acquired absence of left leg below knee: Secondary | ICD-10-CM | POA: Diagnosis not present

## 2023-12-18 DIAGNOSIS — E1122 Type 2 diabetes mellitus with diabetic chronic kidney disease: Secondary | ICD-10-CM | POA: Diagnosis not present

## 2023-12-18 DIAGNOSIS — Z7902 Long term (current) use of antithrombotics/antiplatelets: Secondary | ICD-10-CM | POA: Diagnosis not present

## 2023-12-18 DIAGNOSIS — K219 Gastro-esophageal reflux disease without esophagitis: Secondary | ICD-10-CM | POA: Diagnosis not present

## 2023-12-18 DIAGNOSIS — Z7952 Long term (current) use of systemic steroids: Secondary | ICD-10-CM | POA: Diagnosis not present

## 2023-12-18 DIAGNOSIS — I129 Hypertensive chronic kidney disease with stage 1 through stage 4 chronic kidney disease, or unspecified chronic kidney disease: Secondary | ICD-10-CM | POA: Diagnosis not present

## 2023-12-18 DIAGNOSIS — Z604 Social exclusion and rejection: Secondary | ICD-10-CM | POA: Diagnosis not present

## 2023-12-18 DIAGNOSIS — Z87891 Personal history of nicotine dependence: Secondary | ICD-10-CM | POA: Diagnosis not present

## 2023-12-18 DIAGNOSIS — N390 Urinary tract infection, site not specified: Secondary | ICD-10-CM | POA: Diagnosis not present

## 2023-12-18 DIAGNOSIS — Z940 Kidney transplant status: Secondary | ICD-10-CM | POA: Diagnosis not present

## 2023-12-18 DIAGNOSIS — E785 Hyperlipidemia, unspecified: Secondary | ICD-10-CM | POA: Diagnosis not present

## 2023-12-18 DIAGNOSIS — Z89511 Acquired absence of right leg below knee: Secondary | ICD-10-CM | POA: Diagnosis not present

## 2023-12-18 DIAGNOSIS — N179 Acute kidney failure, unspecified: Secondary | ICD-10-CM | POA: Diagnosis not present

## 2023-12-18 DIAGNOSIS — E1151 Type 2 diabetes mellitus with diabetic peripheral angiopathy without gangrene: Secondary | ICD-10-CM | POA: Diagnosis not present

## 2023-12-18 DIAGNOSIS — N189 Chronic kidney disease, unspecified: Secondary | ICD-10-CM | POA: Diagnosis not present

## 2023-12-18 DIAGNOSIS — R339 Retention of urine, unspecified: Secondary | ICD-10-CM | POA: Diagnosis not present

## 2023-12-18 DIAGNOSIS — Z7982 Long term (current) use of aspirin: Secondary | ICD-10-CM | POA: Diagnosis not present

## 2023-12-18 DIAGNOSIS — I69392 Facial weakness following cerebral infarction: Secondary | ICD-10-CM | POA: Diagnosis not present

## 2023-12-18 DIAGNOSIS — Z9181 History of falling: Secondary | ICD-10-CM | POA: Diagnosis not present

## 2023-12-19 ENCOUNTER — Other Ambulatory Visit: Payer: Self-pay

## 2023-12-21 ENCOUNTER — Institutional Professional Consult (permissible substitution) (INDEPENDENT_AMBULATORY_CARE_PROVIDER_SITE_OTHER): Payer: 59

## 2023-12-22 DIAGNOSIS — Z940 Kidney transplant status: Secondary | ICD-10-CM | POA: Diagnosis not present

## 2023-12-25 DIAGNOSIS — K219 Gastro-esophageal reflux disease without esophagitis: Secondary | ICD-10-CM | POA: Diagnosis not present

## 2023-12-25 DIAGNOSIS — E785 Hyperlipidemia, unspecified: Secondary | ICD-10-CM | POA: Diagnosis not present

## 2023-12-25 DIAGNOSIS — Z604 Social exclusion and rejection: Secondary | ICD-10-CM | POA: Diagnosis not present

## 2023-12-25 DIAGNOSIS — Z7902 Long term (current) use of antithrombotics/antiplatelets: Secondary | ICD-10-CM | POA: Diagnosis not present

## 2023-12-25 DIAGNOSIS — Z89511 Acquired absence of right leg below knee: Secondary | ICD-10-CM | POA: Diagnosis not present

## 2023-12-25 DIAGNOSIS — Z940 Kidney transplant status: Secondary | ICD-10-CM | POA: Diagnosis not present

## 2023-12-25 DIAGNOSIS — I69320 Aphasia following cerebral infarction: Secondary | ICD-10-CM | POA: Diagnosis not present

## 2023-12-25 DIAGNOSIS — Z7982 Long term (current) use of aspirin: Secondary | ICD-10-CM | POA: Diagnosis not present

## 2023-12-25 DIAGNOSIS — N189 Chronic kidney disease, unspecified: Secondary | ICD-10-CM | POA: Diagnosis not present

## 2023-12-25 DIAGNOSIS — E1122 Type 2 diabetes mellitus with diabetic chronic kidney disease: Secondary | ICD-10-CM | POA: Diagnosis not present

## 2023-12-25 DIAGNOSIS — I129 Hypertensive chronic kidney disease with stage 1 through stage 4 chronic kidney disease, or unspecified chronic kidney disease: Secondary | ICD-10-CM | POA: Diagnosis not present

## 2023-12-25 DIAGNOSIS — R339 Retention of urine, unspecified: Secondary | ICD-10-CM | POA: Diagnosis not present

## 2023-12-25 DIAGNOSIS — N390 Urinary tract infection, site not specified: Secondary | ICD-10-CM | POA: Diagnosis not present

## 2023-12-25 DIAGNOSIS — Z9181 History of falling: Secondary | ICD-10-CM | POA: Diagnosis not present

## 2023-12-25 DIAGNOSIS — Z87891 Personal history of nicotine dependence: Secondary | ICD-10-CM | POA: Diagnosis not present

## 2023-12-25 DIAGNOSIS — Z89512 Acquired absence of left leg below knee: Secondary | ICD-10-CM | POA: Diagnosis not present

## 2023-12-25 DIAGNOSIS — N179 Acute kidney failure, unspecified: Secondary | ICD-10-CM | POA: Diagnosis not present

## 2023-12-25 DIAGNOSIS — E1151 Type 2 diabetes mellitus with diabetic peripheral angiopathy without gangrene: Secondary | ICD-10-CM | POA: Diagnosis not present

## 2023-12-25 DIAGNOSIS — I10 Essential (primary) hypertension: Secondary | ICD-10-CM | POA: Diagnosis not present

## 2023-12-25 DIAGNOSIS — Z9483 Pancreas transplant status: Secondary | ICD-10-CM | POA: Diagnosis not present

## 2023-12-25 DIAGNOSIS — Z7952 Long term (current) use of systemic steroids: Secondary | ICD-10-CM | POA: Diagnosis not present

## 2023-12-25 DIAGNOSIS — I69392 Facial weakness following cerebral infarction: Secondary | ICD-10-CM | POA: Diagnosis not present

## 2023-12-28 ENCOUNTER — Encounter: Payer: Self-pay | Admitting: Neurology

## 2024-01-01 DIAGNOSIS — I10 Essential (primary) hypertension: Secondary | ICD-10-CM | POA: Diagnosis not present

## 2024-01-01 DIAGNOSIS — Z9483 Pancreas transplant status: Secondary | ICD-10-CM | POA: Diagnosis not present

## 2024-01-01 DIAGNOSIS — I69392 Facial weakness following cerebral infarction: Secondary | ICD-10-CM | POA: Diagnosis not present

## 2024-01-01 DIAGNOSIS — Z87891 Personal history of nicotine dependence: Secondary | ICD-10-CM | POA: Diagnosis not present

## 2024-01-01 DIAGNOSIS — Z89511 Acquired absence of right leg below knee: Secondary | ICD-10-CM | POA: Diagnosis not present

## 2024-01-01 DIAGNOSIS — Z7982 Long term (current) use of aspirin: Secondary | ICD-10-CM | POA: Diagnosis not present

## 2024-01-01 DIAGNOSIS — Z7952 Long term (current) use of systemic steroids: Secondary | ICD-10-CM | POA: Diagnosis not present

## 2024-01-01 DIAGNOSIS — Z89512 Acquired absence of left leg below knee: Secondary | ICD-10-CM | POA: Diagnosis not present

## 2024-01-01 DIAGNOSIS — E785 Hyperlipidemia, unspecified: Secondary | ICD-10-CM | POA: Diagnosis not present

## 2024-01-01 DIAGNOSIS — E1151 Type 2 diabetes mellitus with diabetic peripheral angiopathy without gangrene: Secondary | ICD-10-CM | POA: Diagnosis not present

## 2024-01-01 DIAGNOSIS — Z940 Kidney transplant status: Secondary | ICD-10-CM | POA: Diagnosis not present

## 2024-01-01 DIAGNOSIS — N189 Chronic kidney disease, unspecified: Secondary | ICD-10-CM | POA: Diagnosis not present

## 2024-01-01 DIAGNOSIS — Z604 Social exclusion and rejection: Secondary | ICD-10-CM | POA: Diagnosis not present

## 2024-01-01 DIAGNOSIS — Z7902 Long term (current) use of antithrombotics/antiplatelets: Secondary | ICD-10-CM | POA: Diagnosis not present

## 2024-01-01 DIAGNOSIS — I129 Hypertensive chronic kidney disease with stage 1 through stage 4 chronic kidney disease, or unspecified chronic kidney disease: Secondary | ICD-10-CM | POA: Diagnosis not present

## 2024-01-01 DIAGNOSIS — I69320 Aphasia following cerebral infarction: Secondary | ICD-10-CM | POA: Diagnosis not present

## 2024-01-01 DIAGNOSIS — K219 Gastro-esophageal reflux disease without esophagitis: Secondary | ICD-10-CM | POA: Diagnosis not present

## 2024-01-01 DIAGNOSIS — Z9181 History of falling: Secondary | ICD-10-CM | POA: Diagnosis not present

## 2024-01-04 ENCOUNTER — Other Ambulatory Visit: Payer: Self-pay

## 2024-01-04 MED ORDER — CYCLOSPORINE MODIFIED 50 MG PO CAPS
50.0000 mg | ORAL_CAPSULE | Freq: Two times a day (BID) | ORAL | 5 refills | Status: DC
  Filled 2024-01-04: qty 60, 30d supply, fill #0
  Filled 2024-01-30: qty 60, 30d supply, fill #1
  Filled 2024-02-24: qty 60, 30d supply, fill #2
  Filled 2024-04-02: qty 60, 30d supply, fill #3
  Filled 2024-04-28: qty 60, 30d supply, fill #4
  Filled 2024-05-28: qty 60, 30d supply, fill #5

## 2024-01-05 ENCOUNTER — Other Ambulatory Visit: Payer: Self-pay

## 2024-01-08 DIAGNOSIS — Z89511 Acquired absence of right leg below knee: Secondary | ICD-10-CM | POA: Diagnosis not present

## 2024-01-08 DIAGNOSIS — Z604 Social exclusion and rejection: Secondary | ICD-10-CM | POA: Diagnosis not present

## 2024-01-08 DIAGNOSIS — E1151 Type 2 diabetes mellitus with diabetic peripheral angiopathy without gangrene: Secondary | ICD-10-CM | POA: Diagnosis not present

## 2024-01-08 DIAGNOSIS — Z9483 Pancreas transplant status: Secondary | ICD-10-CM | POA: Diagnosis not present

## 2024-01-08 DIAGNOSIS — N189 Chronic kidney disease, unspecified: Secondary | ICD-10-CM | POA: Diagnosis not present

## 2024-01-08 DIAGNOSIS — Z7982 Long term (current) use of aspirin: Secondary | ICD-10-CM | POA: Diagnosis not present

## 2024-01-08 DIAGNOSIS — I69392 Facial weakness following cerebral infarction: Secondary | ICD-10-CM | POA: Diagnosis not present

## 2024-01-08 DIAGNOSIS — E785 Hyperlipidemia, unspecified: Secondary | ICD-10-CM | POA: Diagnosis not present

## 2024-01-08 DIAGNOSIS — Z87891 Personal history of nicotine dependence: Secondary | ICD-10-CM | POA: Diagnosis not present

## 2024-01-08 DIAGNOSIS — Z7952 Long term (current) use of systemic steroids: Secondary | ICD-10-CM | POA: Diagnosis not present

## 2024-01-08 DIAGNOSIS — I69320 Aphasia following cerebral infarction: Secondary | ICD-10-CM | POA: Diagnosis not present

## 2024-01-08 DIAGNOSIS — Z7902 Long term (current) use of antithrombotics/antiplatelets: Secondary | ICD-10-CM | POA: Diagnosis not present

## 2024-01-08 DIAGNOSIS — Z9181 History of falling: Secondary | ICD-10-CM | POA: Diagnosis not present

## 2024-01-08 DIAGNOSIS — Z89512 Acquired absence of left leg below knee: Secondary | ICD-10-CM | POA: Diagnosis not present

## 2024-01-08 DIAGNOSIS — I129 Hypertensive chronic kidney disease with stage 1 through stage 4 chronic kidney disease, or unspecified chronic kidney disease: Secondary | ICD-10-CM | POA: Diagnosis not present

## 2024-01-08 DIAGNOSIS — Z940 Kidney transplant status: Secondary | ICD-10-CM | POA: Diagnosis not present

## 2024-01-08 DIAGNOSIS — I10 Essential (primary) hypertension: Secondary | ICD-10-CM | POA: Diagnosis not present

## 2024-01-08 DIAGNOSIS — K219 Gastro-esophageal reflux disease without esophagitis: Secondary | ICD-10-CM | POA: Diagnosis not present

## 2024-01-10 ENCOUNTER — Telehealth (INDEPENDENT_AMBULATORY_CARE_PROVIDER_SITE_OTHER): Payer: Self-pay | Admitting: Otolaryngology

## 2024-01-10 NOTE — Telephone Encounter (Signed)
 Reminder Call:  Date: 01/11/2024 Status: Sch  Time: 8:30 AM [address] [place], [place] [address]  Left Voicemail

## 2024-01-11 ENCOUNTER — Ambulatory Visit (INDEPENDENT_AMBULATORY_CARE_PROVIDER_SITE_OTHER): Payer: 59 | Admitting: Otolaryngology

## 2024-01-11 ENCOUNTER — Ambulatory Visit (INDEPENDENT_AMBULATORY_CARE_PROVIDER_SITE_OTHER): Payer: 59 | Admitting: Audiology

## 2024-01-11 ENCOUNTER — Ambulatory Visit: Payer: 59 | Admitting: Physical Medicine & Rehabilitation

## 2024-01-11 ENCOUNTER — Encounter (INDEPENDENT_AMBULATORY_CARE_PROVIDER_SITE_OTHER): Payer: Self-pay

## 2024-01-11 VITALS — BP 146/78 | HR 68 | Ht 72.0 in | Wt 217.0 lb

## 2024-01-11 DIAGNOSIS — H6123 Impacted cerumen, bilateral: Secondary | ICD-10-CM | POA: Diagnosis not present

## 2024-01-11 DIAGNOSIS — H9190 Unspecified hearing loss, unspecified ear: Secondary | ICD-10-CM

## 2024-01-11 MED ORDER — OFLOXACIN 0.3 % OT SOLN: 5.0000 [drp] | Freq: Every day | OTIC | 1 refills | Status: AC

## 2024-01-11 NOTE — Progress Notes (Signed)
Dear Dr. Yetta Barre, Here is my assessment for our mutual patient, [name]. Thank you for allowing me the opportunity to care for your patient. Please do not hesitate to contact me should you have any other questions. Sincerely, Dr. Jovita Kussmaul  Otolaryngology Clinic Note Referring provider: Dr. Yetta Barre HPI:  [name] is a 48 y.o. male kindly referred by Dr. Yetta Barre for evaluation of hearing loss and cerumen impaction and ear issues.  Patient reports: bilateral ear fullness, intermittent hearing loss; noted to have issues with wax, intermittently cleaned. Otherwise no issues with ears. Has tried mineral oil Patient currently denies: ear pain, fullness, vertigo, drainage, tinnitus Patient additionally denies: deep pain in ear canal, eustachian tube symptoms such as popping, crackling, sensitive to pressure changes Patient also denies barotrauma, vestibular suppressant use, ototoxic medication use No prior audio    H&N Surgery: no Personal or FHx of bleeding dz or anesthesia difficulty: no  PMHx: PAD, HTN, Strokes, s/p Kidney and Pancreas Transplant, Carotid Stenosis, T1DM, ESRD  Independent Review of Additional Tests or Records:  Dr. Yetta Barre (Acute Care) 10/25/2023 referral notes - noted hearing loss, b/l cerumen impaction; Dx: cerumen impaction; Rx: ref ENT Labs 12/12/2023 CMP and CBC: Cr 2.07, GFR 39; WBV 4.6, Hgb 14.4, Plt 202 CT Head 10/11/2023 independently interpreted with attention to ears: mastoids and ME well aerated; b/l cerumen impaction; otic capsule and ossicles unremarkable though cuts thick MRI Brain 10/08/2023 independently interpreted and reviewed - no retrocochlear lesion or mastoid effusion PMH/Meds/All/SocHx/FamHx/ROS:   Past Medical History:  Diagnosis Date   AMPUTATION, BELOW KNEE, HX OF 04/08/2008   Arthritis    "I think I do; just in my fingers & my hands"   Blood transfusion    Cataract    Chronic pain    Depression    Patient states he has never  been depressed.   Diabetes mellitus without complication [facility])    no since pancreas transplant   Dialysis patient [facility]) 04/18/2012   "[facility]; [place], [place], [place]"   Gastroparesis    Gastropathy    GERD (gastroesophageal reflux disease)    Hypertension    MRSA infection    over 10 years ago per patient. in legs     Past Surgical History:  Procedure Laterality Date   AV FISTULA PLACEMENT  08/2011   left upper arm   BELOW KNEE LEG AMPUTATION  "it's been awhile"   bilaterally   CATARACT EXTRACTION  ~ 2011   right   COMBINED KIDNEY-PANCREAS TRANSPLANT  2014   ESOPHAGOGASTRODUODENOSCOPY N/A 12/30/2016   Procedure: ESOPHAGOGASTRODUODENOSCOPY (EGD);  Surgeon: Jeani Hawking, MD;  Location[facility] ENDOSCOPY;  Service: Endoscopy;  Laterality: N/A;   OLECRANON BURSECTOMY Right 06/23/2020   Procedure: RIGHT ELBOW EXCISION OLECRANON BURSITIS;  Surgeon: Nadara Mustard, MD;  Location: [facility];  Service: Orthopedics;  Laterality: Right;    Family History  Problem Relation Age of Onset   Hypertension Mother    Diabetes Mother    Kidney disease Mother    Diabetes Maternal Grandmother    Diabetes Paternal Grandmother    Diabetes Other    Hypertension Other    Lung cancer Maternal Aunt    Colon cancer Neg Hx    Esophageal cancer Neg Hx    Rectal cancer Neg Hx    Stomach cancer Neg Hx      Social Connections: Socially Integrated (09/11/2023)   Social Connection and Isolation Panel [NHANES]    Frequency of Communication with Friends and  Family: More than three times a week    Frequency of Social Gatherings with Friends and Family: More than three times a week    Attends Religious Services: More than 4 times per year    Active Member of Clubs or Organizations: Yes    Attends Engineer, structural: More than 4 times per year    Marital Status: Married      Current Outpatient Medications:    acetaminophen (TYLENOL) 325 MG tablet, Take 1-2 tablets  (325-650 mg total) by mouth every 4 (four) hours as needed for mild pain (pain score 1-3)., Disp: , Rfl:    aspirin EC 81 MG tablet, Take 1 tablet (81 mg total) by mouth daily. Swallow whole., Disp: , Rfl:    clopidogrel (PLAVIX) 75 MG tablet, Take 1 tablet (75 mg total) by mouth daily., Disp: 30 tablet, Rfl: 0   cycloSPORINE modified (GENGRAF) 25 MG capsule, Take 4 capsules (100 mg total) by mouth 2 (two) times daily., Disp: , Rfl:    cycloSPORINE modified (NEORAL) 50 MG capsule, Take 1 capsule by mouth two times daily, Take along with 100 mg twice a day, Disp: 60 capsule, Rfl: 5   hydrALAZINE (APRESOLINE) 50 MG tablet, Take 1 tablet (50 mg total) by mouth 2 (two) times daily., Disp: , Rfl:    ofloxacin (FLOXIN) 0.3 % OTIC solution, Place 5 drops into both ears daily for 10 days., Disp: 10 mL, Rfl: 1   Oxycodone HCl 10 MG TABS, Take 0.5-1 tablets (5-10 mg total) by mouth 3 (three) times daily as needed for pain., Disp: 63 tablet, Rfl: 0   Oxycodone HCl 10 MG TABS, Take 0.5-1 tablets (5-10 mg total) by mouth 4 (four) times daily as needed., Disp: 120 tablet, Rfl: 0   predniSONE (DELTASONE) 5 MG tablet, Take 5 mg by mouth daily., Disp: , Rfl:    carvedilol (COREG) 12.5 MG tablet, Take 1 tablet (12.5 mg total) by mouth 2 (two) times daily with a meal., Disp: 180 tablet, Rfl: 0   mycophenolate (MYFORTIC) 180 MG EC tablet, Take 1 tablet (180 mg total) by mouth 2 (two) times daily., Disp: 180 tablet, Rfl: 0   Oxycodone HCl 10 MG TABS, Take 1 tablet (10 mg total) by mouth 5 (five) times daily, as needed for pain., Disp: 140 tablet, Rfl: 0   pantoprazole (PROTONIX) 40 MG tablet, TAKE 1 TABLET(40 MG) BY MOUTH TWICE DAILY, Disp: 180 tablet, Rfl: 0   rosuvastatin (CRESTOR) 20 MG tablet, Take 1 tablet (20 mg total) by mouth daily., Disp: 90 tablet, Rfl: 0   sulfamethoxazole-trimethoprim (BACTRIM) 400-80 MG tablet, Take 1 tablet by mouth every Monday, Wednesday, and Friday., Disp: 12 tablet, Rfl: 0   terazosin  (HYTRIN) 2 MG capsule, Take 2 capsules (4 mg total) by mouth at bedtime., Disp: 180 capsule, Rfl: 0   Physical Exam:   BP (!) 146/78 (BP Location: Right Arm, Patient Position: Sitting)   Pulse 68   Ht 6' (1.829 m)   Wt 217 lb (98.4 kg)   SpO2 96%   BMI 29.43 kg/m   Salient findings:  Facial nerve function intact Given history and complaints, ear microscopy was indicated and performed for evaluation with findings as below in physical exam section and in procedures; bilateral cerumen impaction which was cleared (see below), after clearance, Mild cerumen film right ear so can't see TM comprehensively but visualized TM intact.  Weber 512: mid Rinne 512: AC > BC b/l  Anterior rhinoscopy: Septum intact; no  purulence No respiratory distress or stridor  Seprately Identifiable Procedures:  Procedure: Bilateral ear microscopy and cerumen removal using microscope (CPT [phone]) - Mod 25 Pre-procedure diagnosis: Cerumen impaction bilateral external ears Post-procedure diagnosis: same Indication: bilateral cerumen impaction; given patient's otologic complaints and history as well as for improved and comprehensive examination of external ear and tympanic membrane, bilateral otologic examination using microscope was performed and impacted cerumen removed  Procedure: Patient was placed semi-recumbent. Both ear canals were examined using the microscope with findings above. Cerumen removed on left and on right using suction and currette with improvement in EAC examination and patency. See findings above afterwards Patient tolerated the procedure well.      Impression & Plans:  [name] is a 48 y.o. male with:  1. Bilateral impacted cerumen   2. Subjective hearing loss    Noted b/l cerumen impaction, which was cleared; subj improvement afterwards D/w pt aud, declined Given small film on right TM, will do oflox x10d, then once weeky F/u 6 months  See below regarding exact medications  prescribed this encounter including dosages and route: Meds ordered this encounter  Medications   ofloxacin (FLOXIN) 0.3 % OTIC solution    Sig: Place 5 drops into both ears daily for 10 days.    Dispense:  10 mL    Refill:  1      Thank you for allowing me the opportunity to care for your patient. Please do not hesitate to contact me should you have any other questions.  Sincerely, Jovita Kussmaul, MD Otolaryngologist (ENT), [facility] Phone: [phone] Fax: [phone]  01/21/2024, 11:30 AM   MDM:  Level 4 Complexity/Problems addressed: low - chronic problem Data complexity: mod - independent review and interpretation of CT imaging and MRI imaging; notes and labs review - Morbidity: mod  - Prescription Drug prescribed or managed: yes

## 2024-01-12 ENCOUNTER — Other Ambulatory Visit: Payer: Self-pay

## 2024-01-12 DIAGNOSIS — N1831 Chronic kidney disease, stage 3a: Secondary | ICD-10-CM | POA: Diagnosis not present

## 2024-01-12 DIAGNOSIS — G8191 Hemiplegia, unspecified affecting right dominant side: Secondary | ICD-10-CM | POA: Diagnosis not present

## 2024-01-12 DIAGNOSIS — S88119A Complete traumatic amputation at level between knee and ankle, unspecified lower leg, initial encounter: Secondary | ICD-10-CM | POA: Diagnosis not present

## 2024-01-12 DIAGNOSIS — G890 Central pain syndrome: Secondary | ICD-10-CM | POA: Diagnosis not present

## 2024-01-12 DIAGNOSIS — Z79899 Other long term (current) drug therapy: Secondary | ICD-10-CM | POA: Diagnosis not present

## 2024-01-12 DIAGNOSIS — Z9483 Pancreas transplant status: Secondary | ICD-10-CM | POA: Diagnosis not present

## 2024-01-12 DIAGNOSIS — G546 Phantom limb syndrome with pain: Secondary | ICD-10-CM | POA: Diagnosis not present

## 2024-01-12 MED ORDER — OXYCODONE HCL 10 MG PO TABS
10.0000 mg | ORAL_TABLET | Freq: Every day | ORAL | 0 refills | Status: DC
  Filled 2024-01-12: qty 140, 28d supply, fill #0

## 2024-01-14 ENCOUNTER — Encounter: Payer: Self-pay | Admitting: Internal Medicine

## 2024-01-15 ENCOUNTER — Other Ambulatory Visit: Payer: Self-pay

## 2024-01-15 ENCOUNTER — Other Ambulatory Visit: Payer: Self-pay | Admitting: Internal Medicine

## 2024-01-15 ENCOUNTER — Telehealth: Payer: Self-pay | Admitting: Internal Medicine

## 2024-01-15 DIAGNOSIS — Z7952 Long term (current) use of systemic steroids: Secondary | ICD-10-CM | POA: Diagnosis not present

## 2024-01-15 DIAGNOSIS — Z7982 Long term (current) use of aspirin: Secondary | ICD-10-CM | POA: Diagnosis not present

## 2024-01-15 DIAGNOSIS — Z7902 Long term (current) use of antithrombotics/antiplatelets: Secondary | ICD-10-CM | POA: Diagnosis not present

## 2024-01-15 DIAGNOSIS — I10 Essential (primary) hypertension: Secondary | ICD-10-CM

## 2024-01-15 DIAGNOSIS — K219 Gastro-esophageal reflux disease without esophagitis: Secondary | ICD-10-CM

## 2024-01-15 DIAGNOSIS — Z9181 History of falling: Secondary | ICD-10-CM | POA: Diagnosis not present

## 2024-01-15 DIAGNOSIS — Z87891 Personal history of nicotine dependence: Secondary | ICD-10-CM | POA: Diagnosis not present

## 2024-01-15 DIAGNOSIS — I129 Hypertensive chronic kidney disease with stage 1 through stage 4 chronic kidney disease, or unspecified chronic kidney disease: Secondary | ICD-10-CM | POA: Diagnosis not present

## 2024-01-15 DIAGNOSIS — I69392 Facial weakness following cerebral infarction: Secondary | ICD-10-CM | POA: Diagnosis not present

## 2024-01-15 DIAGNOSIS — Z940 Kidney transplant status: Secondary | ICD-10-CM | POA: Diagnosis not present

## 2024-01-15 DIAGNOSIS — Z89511 Acquired absence of right leg below knee: Secondary | ICD-10-CM | POA: Diagnosis not present

## 2024-01-15 DIAGNOSIS — E785 Hyperlipidemia, unspecified: Secondary | ICD-10-CM

## 2024-01-15 DIAGNOSIS — Z89512 Acquired absence of left leg below knee: Secondary | ICD-10-CM | POA: Diagnosis not present

## 2024-01-15 DIAGNOSIS — Z604 Social exclusion and rejection: Secondary | ICD-10-CM | POA: Diagnosis not present

## 2024-01-15 DIAGNOSIS — N189 Chronic kidney disease, unspecified: Secondary | ICD-10-CM | POA: Diagnosis not present

## 2024-01-15 DIAGNOSIS — E1151 Type 2 diabetes mellitus with diabetic peripheral angiopathy without gangrene: Secondary | ICD-10-CM | POA: Diagnosis not present

## 2024-01-15 DIAGNOSIS — I151 Hypertension secondary to other renal disorders: Secondary | ICD-10-CM

## 2024-01-15 DIAGNOSIS — I69320 Aphasia following cerebral infarction: Secondary | ICD-10-CM | POA: Diagnosis not present

## 2024-01-15 DIAGNOSIS — Z9483 Pancreas transplant status: Secondary | ICD-10-CM | POA: Diagnosis not present

## 2024-01-15 MED ORDER — CARVEDILOL 12.5 MG PO TABS: 12.5000 mg | ORAL_TABLET | Freq: Two times a day (BID) | ORAL | 0 refills | Status: DC

## 2024-01-15 MED ORDER — TERAZOSIN HCL 2 MG PO CAPS: 4.0000 mg | ORAL_CAPSULE | Freq: Every day | ORAL | 0 refills | Status: DC

## 2024-01-15 MED ORDER — MYCOPHENOLATE SODIUM 180 MG PO TBEC: 180.0000 mg | DELAYED_RELEASE_TABLET | Freq: Two times a day (BID) | ORAL | 0 refills | Status: DC

## 2024-01-15 MED ORDER — PANTOPRAZOLE SODIUM 40 MG PO TBEC: 40.0000 mg | DELAYED_RELEASE_TABLET | Freq: Two times a day (BID) | ORAL | 0 refills | Status: DC

## 2024-01-15 MED ORDER — ROSUVASTATIN CALCIUM 20 MG PO TABS: 20.0000 mg | ORAL_TABLET | Freq: Every day | ORAL | 0 refills | Status: DC

## 2024-01-15 MED ORDER — SULFAMETHOXAZOLE-TRIMETHOPRIM 400-80 MG PO TABS: 1.0000 | ORAL_TABLET | ORAL | 0 refills | Status: DC

## 2024-01-15 NOTE — Telephone Encounter (Signed)
Copied from CRM [id number]. Topic: Clinical - Home Health Verbal Orders >> Jan 15, 2024 12:20 PM Orson Gear wrote: Caller/Agency: [facility] Callback Number: [phone] Service Requested: Speech Therapy Frequency: once a week for 4 weeks Any new concerns about the patient? No

## 2024-01-17 ENCOUNTER — Ambulatory Visit (HOSPITAL_COMMUNITY)
Admission: RE | Admit: 2024-01-17 | Discharge: 2024-01-17 | Disposition: A | Discharge status: Home / Self Care | Payer: 59 | Source: Ambulatory Visit | Attending: Neurology | Admitting: Neurology

## 2024-01-17 DIAGNOSIS — I639 Cerebral infarction, unspecified: Secondary | ICD-10-CM | POA: Diagnosis not present

## 2024-01-18 ENCOUNTER — Encounter: Payer: Self-pay | Admitting: Neurology

## 2024-01-18 ENCOUNTER — Encounter: Payer: Self-pay | Admitting: Physical Medicine & Rehabilitation

## 2024-01-18 ENCOUNTER — Ambulatory Visit: Payer: 59 | Admitting: Orthopedic Surgery

## 2024-01-18 ENCOUNTER — Encounter: Payer: 59 | Attending: Physical Medicine & Rehabilitation | Admitting: Physical Medicine & Rehabilitation

## 2024-01-18 VITALS — BP 136/75 | HR 73 | Ht 72.0 in | Wt 217.0 lb

## 2024-01-18 DIAGNOSIS — I69351 Hemiplegia and hemiparesis following cerebral infarction affecting right dominant side: Secondary | ICD-10-CM | POA: Insufficient documentation

## 2024-01-18 DIAGNOSIS — I639 Cerebral infarction, unspecified: Secondary | ICD-10-CM

## 2024-01-18 DIAGNOSIS — I69320 Aphasia following cerebral infarction: Secondary | ICD-10-CM | POA: Insufficient documentation

## 2024-01-18 NOTE — Progress Notes (Signed)
Subjective:    Patient ID: [name], male    DOB: 31-Aug-1976, 48 y.o.   MRN: [id number] 48 y.o. 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 Patient was at his infusion center today getting his Belatacept infusion for chronic immunosuppressive therapy and during his infusion he became hypotensive and had full body burning sensation. Patient did receive Benadryl at the infusion center and patient was transported to the [facility] for further evaluation and management. On initial arrival to the emergency department patient was altered, and had blood pressure of 99/86. Patient was altered, and minimally responsive. Patient was given IM epinephrine with mild improvement in mental status. Patient was mumbling and nonsensical, and therefore with concern for not protecting his own airway, patient was emergently intubated. Patient subsequently had code stroke called. Patient was admitted to the ICU for further evaluation management of altered mental status. Treated for E. coli UTI/bacteremia. Continues to have right hemiparesis with significant right arm weakness and mild right leg weakness. He is speech is improving but remains nonfluent but able to speak sentences and follows commands well. 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 Extubated 9/26. Nausea treated with scheduled Reglan. AKI atop CKD II-IIIa. Nephrology consulted 9/30. Immunosuppressant drug regimen reviewed/adjusted. Good UOP and no significant PVRs. Tolerating diet. Patient requiring mod A bed mobility then mod x 2 to stand to STEDY then min A of 2 for safety. Participating with multiple STS and standing weight shifting with therapist assist R UE and R knee ext. 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  He is currently receiving home health PT only.  PT and OT have finished even though he is not back to his functional status.  He is working with the prosthetists to ensure a better fit of his bilateral below-knee amputation prostheses.  He is trying to go up and down steps with his wife he does have some difficulty in both directions.  No falls luckily. Pain Inventory Average Pain 7 Pain Right Now 7 My pain is  generalized  In the last 24 hours, has pain interfered with the following? General activity 5 Relation with others 5 Enjoyment of life 5 What TIME of day is your pain at its worst? varies Sleep (in general) Good  Pain is worse with: some activites Pain improves with: medication Relief from Meds: 5  Family History  Problem Relation Age of Onset   Hypertension Mother    Diabetes Mother    Kidney disease Mother    Diabetes Maternal Grandmother    Diabetes Paternal Grandmother    Diabetes Other    Hypertension Other    Lung cancer Maternal Aunt    Colon cancer Neg Hx    Esophageal cancer Neg Hx    Rectal cancer Neg Hx    Stomach cancer Neg Hx    Social History   Socioeconomic History   Marital status: Married    Spouse name: Not on file   Number of children: 1   Years of education: 11   Highest education level: Not on file  Occupational History   Occupation: Disability  Tobacco Use   Smoking status: Former    Types: Software engineer  Quit date: 07/06/2022    Years since quitting: 1.5   Smokeless tobacco: Never   Tobacco comments:    black and mild  Vaping Use   Vaping status: Never Used  Substance and Sexual Activity   Alcohol use: No   Drug use: Not Currently    Frequency: 3.0 times per week    Types: Marijuana    Comment: Occasionally   Sexual activity: Yes    Partners: Female  Other Topics  Concern   Not on file  Social History Narrative   Fun: Restore old cars    Social Drivers of Health   Financial Resource Strain: Low Risk  (09/11/2023)   Overall Financial Resource Strain (CARDIA)    Difficulty of Paying Living Expenses: Not hard at all  Food Insecurity: No Food Insecurity (09/29/2023)   Hunger Vital Sign    Worried About Running Out of Food in the Last Year: Never true    Ran Out of Food in the Last Year: Never true  Transportation Needs: No Transportation Needs (09/20/2023)   PRAPARE - Administrator, Civil Service (Medical): No    Lack of Transportation (Non-Medical): No  Physical Activity: Inactive (09/11/2023)   Exercise Vital Sign    Days of Exercise per Week: 0 days    Minutes of Exercise per Session: 0 min  Stress: No Stress Concern Present (09/11/2023)   [employer] of Occupational Health - Occupational Stress Questionnaire    Feeling of Stress : Not at all  Social Connections: Socially Integrated (09/11/2023)   Social Connection and Isolation Panel [NHANES]    Frequency of Communication with Friends and Family: More than three times a week    Frequency of Social Gatherings with Friends and Family: More than three times a week    Attends Religious Services: More than 4 times per year    Active Member of Clubs or Organizations: Yes    Attends Engineer, structural: More than 4 times per year    Marital Status: Married   Past Surgical History:  Procedure Laterality Date   AV FISTULA PLACEMENT  08/2011   left upper arm   BELOW KNEE LEG AMPUTATION  "it's been awhile"   bilaterally   CATARACT EXTRACTION  ~ 2011   right   COMBINED KIDNEY-PANCREAS TRANSPLANT  2014   ESOPHAGOGASTRODUODENOSCOPY N/A 12/30/2016   Procedure: ESOPHAGOGASTRODUODENOSCOPY (EGD);  Surgeon: Jeani Hawking, MD;  Location: [facility] ENDOSCOPY;  Service: Endoscopy;  Laterality: N/A;   OLECRANON BURSECTOMY Right 06/23/2020   Procedure: RIGHT ELBOW EXCISION OLECRANON BURSITIS;   Surgeon: Nadara Mustard, MD;  Location: [facility];  Service: Orthopedics;  Laterality: Right;   Past Surgical History:  Procedure Laterality Date   AV FISTULA PLACEMENT  08/2011   left upper arm   BELOW KNEE LEG AMPUTATION  "it's been awhile"   bilaterally   CATARACT EXTRACTION  ~ 2011   right   COMBINED KIDNEY-PANCREAS TRANSPLANT  2014   ESOPHAGOGASTRODUODENOSCOPY N/A 12/30/2016   Procedure: ESOPHAGOGASTRODUODENOSCOPY (EGD);  Surgeon: Jeani Hawking, MD;  Location: [facility] ENDOSCOPY;  Service: Endoscopy;  Laterality: N/A;   OLECRANON BURSECTOMY Right 06/23/2020   Procedure: RIGHT ELBOW EXCISION OLECRANON BURSITIS;  Surgeon: Nadara Mustard, MD;  Location: [facility];  Service: Orthopedics;  Laterality: Right;   Past Medical History:  Diagnosis Date   AMPUTATION, BELOW KNEE, HX OF 04/08/2008   Arthritis    "I think I do; just in my fingers & my hands"  Blood transfusion    Cataract    Chronic pain    Depression    Patient states he has never been depressed.   Diabetes mellitus without complication [facility])    no since pancreas transplant   Dialysis patient [facility]) 04/18/2012   "[facility]; [place], [place], Sat"   Gastroparesis    Gastropathy    GERD (gastroesophageal reflux disease)    Hypertension    MRSA infection    over 10 years ago per patient. in legs   BP 136/75   Pulse 73   Ht 6' (1.829 m)   Wt 217 lb (98.4 kg) Comment: reported  SpO2 98%   BMI 29.43 kg/m   Opioid Risk Score:   Fall Risk Score:  `1  Depression screen [facility] 2/9     01/18/2024    2:57 PM 10/31/2023   11:15 AM 09/11/2023    3:36 PM 09/06/2022    4:08 PM 08/26/2016   10:04 AM 04/22/2016    9:41 AM 12/14/2015   11:56 AM  Depression screen PHQ 2/9  Decreased Interest 0 0 0 0 0 0 0  Down, Depressed, Hopeless 0 0 0 0 0 0 0  PHQ - 2 Score 0 0 0 0 0 0 0  Altered sleeping  0 0      Tired, decreased energy  0 0      Change in appetite  0 0      Feeling bad or failure  about yourself   0 0      Trouble concentrating  0 0      Moving slowly or fidgety/restless  1 0      Suicidal thoughts  0 0      PHQ-9 Score  1 0      Difficult doing work/chores  Somewhat difficult Not difficult at all         Review of Systems  Musculoskeletal:  Positive for gait problem.  All other systems reviewed and are negative.      Objective:   Physical Exam Vitals and nursing note reviewed.  Constitutional:      Appearance: Normal appearance. He is obese.  HENT:     Head: Normocephalic and atraumatic.  Eyes:     Extraocular Movements: Extraocular movements intact.     Conjunctiva/sclera: Conjunctivae normal.     Pupils: Pupils are equal, round, and reactive to light.  Neurological:     Mental Status: He is oriented to person, place, and time.     Motor: Weakness and tremor present. No seizure activity.     Coordination: Coordination abnormal. Finger-Nose-Finger Test abnormal. Impaired rapid alternating movements.     Gait: Gait abnormal.  Psychiatric:        Mood and Affect: Mood normal.        Behavior: Behavior normal.   Reduced rapid alternating forearm supination pronation right  upper extremity  Gait not tested does not have walker with him. Motor strength is 4 - at the right deltoid bicep tricep 3 - finger flexion extension Right lower extremity 3 - hip flexion and knee extension 5/5 in the left upper and left lower limb excluding lower leg Bilateral BKA Sensation reported is equal bilateral upper extremities. Speech reduced sentence length, positive anomia     Assessment & Plan:  1.  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  Continue home health beach and once that concludes wife has been instructed to call so we can  order outpatient PT OT speech We discussed warming up the hip flexors prior to going up steps and knee extensors prior to going down steps.  We also talked about doing 3 sets of 10 knee  extensions every day as well as 3 sets of 10 hip flexion exercises Follow-up with prosthetists to adjust BKA prostheses

## 2024-01-18 NOTE — Patient Instructions (Signed)
Please do hip flexion exercises 3 sets of 10 reps every day Please do knee extension exercises 3 sets of 10 reps every day You can also do these exercises before going up and down steps. Do the hip flexion exercise before going up steps Do the knee extension exercises before going down steps. This will help warm up the muscles and help you activate them

## 2024-01-19 NOTE — Telephone Encounter (Signed)
May I give the verbal orders ?

## 2024-01-19 NOTE — Telephone Encounter (Signed)
Copied from CRM [id number]. Topic: Clinical - Home Health Verbal Orders >> Jan 19, 2024 11:44 AM Fuller Mandril wrote: Caller/Agency: [facility] Callback Number: [phone] voice mail ok  Service Requested: Speech Therapy Frequency: once a week for 4 weeks  Any new concerns about the patient? No

## 2024-01-19 NOTE — Telephone Encounter (Signed)
Verbal orders given

## 2024-01-21 ENCOUNTER — Encounter (INDEPENDENT_AMBULATORY_CARE_PROVIDER_SITE_OTHER): Payer: Self-pay

## 2024-01-22 ENCOUNTER — Telehealth: Payer: Self-pay

## 2024-01-22 DIAGNOSIS — E785 Hyperlipidemia, unspecified: Secondary | ICD-10-CM | POA: Diagnosis not present

## 2024-01-22 DIAGNOSIS — I69320 Aphasia following cerebral infarction: Secondary | ICD-10-CM | POA: Diagnosis not present

## 2024-01-22 DIAGNOSIS — Z7902 Long term (current) use of antithrombotics/antiplatelets: Secondary | ICD-10-CM | POA: Diagnosis not present

## 2024-01-22 DIAGNOSIS — I129 Hypertensive chronic kidney disease with stage 1 through stage 4 chronic kidney disease, or unspecified chronic kidney disease: Secondary | ICD-10-CM | POA: Diagnosis not present

## 2024-01-22 DIAGNOSIS — E1151 Type 2 diabetes mellitus with diabetic peripheral angiopathy without gangrene: Secondary | ICD-10-CM | POA: Diagnosis not present

## 2024-01-22 DIAGNOSIS — Z9181 History of falling: Secondary | ICD-10-CM | POA: Diagnosis not present

## 2024-01-22 DIAGNOSIS — Z9483 Pancreas transplant status: Secondary | ICD-10-CM | POA: Diagnosis not present

## 2024-01-22 DIAGNOSIS — Z89512 Acquired absence of left leg below knee: Secondary | ICD-10-CM | POA: Diagnosis not present

## 2024-01-22 DIAGNOSIS — Z89511 Acquired absence of right leg below knee: Secondary | ICD-10-CM | POA: Diagnosis not present

## 2024-01-22 DIAGNOSIS — Z87891 Personal history of nicotine dependence: Secondary | ICD-10-CM | POA: Diagnosis not present

## 2024-01-22 DIAGNOSIS — Z940 Kidney transplant status: Secondary | ICD-10-CM | POA: Diagnosis not present

## 2024-01-22 DIAGNOSIS — I10 Essential (primary) hypertension: Secondary | ICD-10-CM | POA: Diagnosis not present

## 2024-01-22 DIAGNOSIS — Z604 Social exclusion and rejection: Secondary | ICD-10-CM | POA: Diagnosis not present

## 2024-01-22 DIAGNOSIS — Z7952 Long term (current) use of systemic steroids: Secondary | ICD-10-CM | POA: Diagnosis not present

## 2024-01-22 DIAGNOSIS — I69392 Facial weakness following cerebral infarction: Secondary | ICD-10-CM | POA: Diagnosis not present

## 2024-01-22 DIAGNOSIS — N189 Chronic kidney disease, unspecified: Secondary | ICD-10-CM | POA: Diagnosis not present

## 2024-01-22 DIAGNOSIS — Z7982 Long term (current) use of aspirin: Secondary | ICD-10-CM | POA: Diagnosis not present

## 2024-01-22 DIAGNOSIS — K219 Gastro-esophageal reflux disease without esophagitis: Secondary | ICD-10-CM | POA: Diagnosis not present

## 2024-01-22 NOTE — Progress Notes (Signed)
Care Guide Pharmacy Note  01/22/2024 Name: [name] MRN: [id number] DOB: 10-Mar-1976  Referred By: [name], MD Reason for referral: Care Coordination (Outreach to schedule with Pharm d )   [name] is a 48 y.o. year old male who is a primary care patient of [name], MD.  [name] was referred to the pharmacist for assistance related to: HTN  An unsuccessful telephone outreach was attempted today to contact the patient who was referred to the pharmacy team for assistance with medication management. Additional attempts will be made to contact the patient.  Penne Lash , RMA     [facility]  [facility], [facility] Guide  Direct Dial: [phone]  Website: [email or web address]

## 2024-01-25 ENCOUNTER — Telehealth: Payer: Self-pay | Admitting: Neurology

## 2024-01-25 NOTE — Telephone Encounter (Signed)
Call to wife, she is [name], civil (consulting) and verbalized understanding of results and in agreement to follow up with PCP for further testing

## 2024-01-25 NOTE — Progress Notes (Signed)
Care Guide Pharmacy Note  01/25/2024 Name: [name] MRN: [id number] DOB: January 21, 1976  Referred By: [name], MD Reason for referral: Care Coordination (Outreach to schedule with Pharm d )   [name] is a 48 y.o. year old male who is a primary care patient of [name], MD.  [name] was referred to the pharmacist for assistance related to: HTN  A second unsuccessful telephone outreach was attempted today to contact the patient who was referred to the pharmacy team for assistance with medication management. Additional attempts will be made to contact the patient.  [name] , RMA     [facility]  [facility], [facility] Guide  Direct Dial: [phone]  Website: [email or web address]

## 2024-01-25 NOTE — Telephone Encounter (Signed)
Please call, carotid ultrasound did not show any significant blockages.  There is mention of solid structure to the right side of the neck, recommended possible further imaging if clinically indicated. Would recommend discuss with primary care. May consider thyroid US.   Summary:  Right Carotid: The extracranial vessels were near-normal with only minimal  wall                thickening or plaque. Solid appearing heterogenous,  hypoechoic                structure seen in right side of neck measuring 2.17 x 1.69  x 1.11                 cm. Would recommend further imaging if clinically  indicated.   Left Carotid: The extracranial vessels were near-normal with only minimal  wall               thickening or plaque.   Vertebrals:  Bilateral vertebral arteries demonstrate antegrade flow.  Subclavians: Normal flow hemodynamics were seen in bilateral subclavian               arteries.

## 2024-01-26 NOTE — Progress Notes (Signed)
Care Guide Pharmacy Note  01/26/2024 Name: [name] MRN: [id number] DOB: [birth year]  Referred By: [name], MD Reason for referral: Care Coordination (Outreach to schedule with Pharm d )   [name] is a 48 y.o. year old male who is a primary care patient of [name], MD.  [name] was referred to the pharmacist for assistance related to: HTN  Successful contact was made with the patient to discuss pharmacy services including being ready for the pharmacist to call at least 5 minutes before the scheduled appointment time and to have medication bottles and any blood pressure readings ready for review. The patient agreed to meet with the pharmacist via telephone visit on (date/time).02/14/2024  Penne Lash , RMA     [place]  [facility], [facility] Guide  Direct Dial: [phone]  Website: [email or web address]

## 2024-01-29 DIAGNOSIS — I129 Hypertensive chronic kidney disease with stage 1 through stage 4 chronic kidney disease, or unspecified chronic kidney disease: Secondary | ICD-10-CM | POA: Diagnosis not present

## 2024-01-29 DIAGNOSIS — I69392 Facial weakness following cerebral infarction: Secondary | ICD-10-CM | POA: Diagnosis not present

## 2024-01-29 DIAGNOSIS — Z940 Kidney transplant status: Secondary | ICD-10-CM | POA: Diagnosis not present

## 2024-01-29 DIAGNOSIS — Z7952 Long term (current) use of systemic steroids: Secondary | ICD-10-CM | POA: Diagnosis not present

## 2024-01-29 DIAGNOSIS — K219 Gastro-esophageal reflux disease without esophagitis: Secondary | ICD-10-CM | POA: Diagnosis not present

## 2024-01-29 DIAGNOSIS — N189 Chronic kidney disease, unspecified: Secondary | ICD-10-CM | POA: Diagnosis not present

## 2024-01-29 DIAGNOSIS — E1151 Type 2 diabetes mellitus with diabetic peripheral angiopathy without gangrene: Secondary | ICD-10-CM | POA: Diagnosis not present

## 2024-01-29 DIAGNOSIS — I69320 Aphasia following cerebral infarction: Secondary | ICD-10-CM | POA: Diagnosis not present

## 2024-01-29 DIAGNOSIS — Z89511 Acquired absence of right leg below knee: Secondary | ICD-10-CM | POA: Diagnosis not present

## 2024-01-29 DIAGNOSIS — E785 Hyperlipidemia, unspecified: Secondary | ICD-10-CM | POA: Diagnosis not present

## 2024-01-29 DIAGNOSIS — Z9483 Pancreas transplant status: Secondary | ICD-10-CM | POA: Diagnosis not present

## 2024-01-29 DIAGNOSIS — Z7982 Long term (current) use of aspirin: Secondary | ICD-10-CM | POA: Diagnosis not present

## 2024-01-29 DIAGNOSIS — Z87891 Personal history of nicotine dependence: Secondary | ICD-10-CM | POA: Diagnosis not present

## 2024-01-29 DIAGNOSIS — I10 Essential (primary) hypertension: Secondary | ICD-10-CM | POA: Diagnosis not present

## 2024-01-29 DIAGNOSIS — Z9181 History of falling: Secondary | ICD-10-CM | POA: Diagnosis not present

## 2024-01-29 DIAGNOSIS — Z7902 Long term (current) use of antithrombotics/antiplatelets: Secondary | ICD-10-CM | POA: Diagnosis not present

## 2024-01-29 DIAGNOSIS — Z89512 Acquired absence of left leg below knee: Secondary | ICD-10-CM | POA: Diagnosis not present

## 2024-01-29 DIAGNOSIS — Z604 Social exclusion and rejection: Secondary | ICD-10-CM | POA: Diagnosis not present

## 2024-01-30 ENCOUNTER — Other Ambulatory Visit: Payer: Self-pay

## 2024-01-31 ENCOUNTER — Other Ambulatory Visit: Payer: Self-pay

## 2024-02-05 ENCOUNTER — Ambulatory Visit: Payer: 59 | Admitting: Orthopedic Surgery

## 2024-02-05 DIAGNOSIS — I129 Hypertensive chronic kidney disease with stage 1 through stage 4 chronic kidney disease, or unspecified chronic kidney disease: Secondary | ICD-10-CM | POA: Diagnosis not present

## 2024-02-05 DIAGNOSIS — Z7952 Long term (current) use of systemic steroids: Secondary | ICD-10-CM | POA: Diagnosis not present

## 2024-02-05 DIAGNOSIS — I10 Essential (primary) hypertension: Secondary | ICD-10-CM | POA: Diagnosis not present

## 2024-02-05 DIAGNOSIS — Z89511 Acquired absence of right leg below knee: Secondary | ICD-10-CM | POA: Diagnosis not present

## 2024-02-05 DIAGNOSIS — E1151 Type 2 diabetes mellitus with diabetic peripheral angiopathy without gangrene: Secondary | ICD-10-CM | POA: Diagnosis not present

## 2024-02-05 DIAGNOSIS — E785 Hyperlipidemia, unspecified: Secondary | ICD-10-CM | POA: Diagnosis not present

## 2024-02-05 DIAGNOSIS — Z7902 Long term (current) use of antithrombotics/antiplatelets: Secondary | ICD-10-CM | POA: Diagnosis not present

## 2024-02-05 DIAGNOSIS — Z9483 Pancreas transplant status: Secondary | ICD-10-CM | POA: Diagnosis not present

## 2024-02-05 DIAGNOSIS — Z604 Social exclusion and rejection: Secondary | ICD-10-CM | POA: Diagnosis not present

## 2024-02-05 DIAGNOSIS — I69320 Aphasia following cerebral infarction: Secondary | ICD-10-CM | POA: Diagnosis not present

## 2024-02-05 DIAGNOSIS — Z87891 Personal history of nicotine dependence: Secondary | ICD-10-CM | POA: Diagnosis not present

## 2024-02-05 DIAGNOSIS — Z940 Kidney transplant status: Secondary | ICD-10-CM | POA: Diagnosis not present

## 2024-02-05 DIAGNOSIS — N189 Chronic kidney disease, unspecified: Secondary | ICD-10-CM | POA: Diagnosis not present

## 2024-02-05 DIAGNOSIS — I69392 Facial weakness following cerebral infarction: Secondary | ICD-10-CM | POA: Diagnosis not present

## 2024-02-05 DIAGNOSIS — Z89512 Acquired absence of left leg below knee: Secondary | ICD-10-CM | POA: Diagnosis not present

## 2024-02-05 DIAGNOSIS — Z7982 Long term (current) use of aspirin: Secondary | ICD-10-CM | POA: Diagnosis not present

## 2024-02-05 DIAGNOSIS — Z9181 History of falling: Secondary | ICD-10-CM | POA: Diagnosis not present

## 2024-02-05 DIAGNOSIS — K219 Gastro-esophageal reflux disease without esophagitis: Secondary | ICD-10-CM | POA: Diagnosis not present

## 2024-02-05 NOTE — Telephone Encounter (Signed)
 [name] wants to know can can you place his referral for OT, PT and speech therapy to the facility list below? They want to start the week of 2/24 and please let the facility know that he finished home health therapies on 2/17.

## 2024-02-05 NOTE — Telephone Encounter (Signed)
 Copied from CRM [id number]. Topic: General - Call Back - No Documentation >> Feb 05, 2024  3:07 PM Artemio Larry wrote: Stafford Eagles from [facility] returning [place] phone call - Call back number [phone]

## 2024-02-05 NOTE — Telephone Encounter (Signed)
 Unable to reach eliza. LMTRC

## 2024-02-05 NOTE — Telephone Encounter (Signed)
 Copied from CRM [id number]. Topic: Clinical - Home Health Verbal Orders >> Feb 05, 2024  1:52 PM Margarette Shawl wrote: Caller/Agency: Stafford Eagles, home health speech therapist.  Callback Number: [phone] Service Requested: Occupational Therapy, Physical Therapy, and Speech Therapy(outpatient) Frequency: N/a Any new concerns about the patient? No would like referral sent to:  [facility] Ph# [phone] Fax# [phone]  Asked that the referral include he will discharge from home health therapy on 02/12/2024, and that family would like to begin outpatient therapy the week of 02/19/2024.

## 2024-02-05 NOTE — Telephone Encounter (Signed)
 Can I give the verbals ?

## 2024-02-09 ENCOUNTER — Encounter: Payer: Self-pay | Admitting: Orthopedic Surgery

## 2024-02-09 NOTE — Progress Notes (Signed)
Office Visit Note   Patient: [name]           Date of Birth: Dec 24, 1976           MRN: [id number] Visit Date: 02/05/2024              Requested by: [name], MD [address] [place],  [place] [address] PCP: [name], MD  Chief Complaint  Patient presents with   Right Leg - Follow-up   Left Leg - Follow-up      HPI: Patient is a 48 year old gentleman who is seen in follow-up for bilateral transtibial amputation.  Patient has had 2 separate strokes and subsequently has had increased swelling in both lower extremities.  Assessment & Plan: Visit Diagnoses:  1. S/P bilateral BKA (below knee amputation) (HCC)     Plan: A prescription was provided for thinner liners possibly new sockets.  Follow-Up Instructions: Return if symptoms worsen or fail to improve.   Ortho Exam  Patient is alert, oriented, no adenopathy, well-dressed, normal affect, normal respiratory effort. Examination patient is developing increased pressure on the residual limb with pending skin breakdown secondary to swelling from a stroke.  Patient will need thinner liners and possibly a new socket.  Patient is an existing bilateral transtibial  amputee.  Patient's current comorbidities are not expected to impact the ability to function with the prescribed prosthesis. Patient verbally communicates a strong desire to use a prosthesis. Patient currently requires mobility aids to ambulate without a prosthesis.  Expects not to use mobility aids with a new prosthesis.  Patient is a K3 level ambulator that spends a lot of time walking around on uneven terrain over obstacles, up and down stairs, and ambulates with a variable cadence.     Imaging: No results found. No images are attached to the encounter.  Labs: Lab Results  Component Value Date   HGBA1C 5.8 09/12/2023   HGBA1C 5.9 05/18/2023   HGBA1C 5.7 06/16/2021   CRP 0.7 01/20/2020   CRP 0.7 01/19/2020   CRP 1.1 (H)  01/18/2020   LABURIC 5.6 10/23/2020   REPTSTATUS 10/04/2023 FINAL 09/29/2023   GRAMSTAIN  05/14/2017    FEW WBC PRESENT, PREDOMINANTLY PMN RARE GRAM POSITIVE COCCI IN PAIRS    CULT  09/29/2023    NO GROWTH 5 DAYS Performed at [facility], [address] [place], [place] [address]    LABORGA ESCHERICHIA COLI (A) 09/23/2023     Lab Results  Component Value Date   ALBUMIN 2.8 (L) 10/15/2023   ALBUMIN 2.8 (L) 10/14/2023   ALBUMIN 2.7 (L) 10/13/2023    Lab Results  Component Value Date   MG 1.9 09/29/2023   MG 1.9 09/28/2023   MG 1.9 09/27/2023   No results found for: "VD25OH"  No results found for: "PREALBUMIN"    Latest Ref Rng & Units 10/16/2023    5:40 AM 10/09/2023    5:30 AM 10/08/2023    9:12 AM  CBC EXTENDED  WBC 4.0 - 10.5 K/uL 7.4  7.3  7.7   RBC 4.22 - 5.81 MIL/uL 4.45  4.16  4.09   Hemoglobin 13.0 - 17.0 g/dL 01.0  27.2  53.6   HCT 39.0 - 52.0 % 43.2  40.4  39.9   Platelets 150 - 400 K/uL 263  461  495      There is no height or weight on file to calculate BMI.  Orders:  No orders of the defined types  were placed in this encounter.  No orders of the defined types were placed in this encounter.    Procedures: No procedures performed  Clinical Data: No additional findings.  ROS:  All other systems negative, except as noted in the HPI. Review of Systems  Objective: Vital Signs: There were no vitals taken for this visit.  Specialty Comments:  No specialty comments available.  PMFS History: Patient Active Problem List   Diagnosis Date Noted   Hearing loss due to cerumen impaction, bilateral 10/25/2023   Ischemic cerebrovascular accident (CVA) due to global hypoperfusion with watershed infarction [facility]) 09/20/2023   Hypertension secondary to other renal disorders 09/18/2023   PAD (peripheral artery disease) (HCC) 09/04/2023   Diabetes mellitus type II, controlled (HCC) 09/04/2023   Abnormal electrocardiogram (ECG) (EKG) 07/20/2023    Chronic pain 07/04/2023   Tobacco dependence 07/04/2023   Marijuana dependence (HCC) 07/04/2023   Screen for colon cancer 01/10/2022   Balanitis 01/10/2022   Encounter for general adult medical examination with abnormal findings 01/10/2022   Screening for prostate cancer 01/10/2022   Healthcare maintenance 08/26/2016   Impaired mobility and activities of daily living 07/03/2014   Renal transplant, status post 07/19/2013   History of pancreas transplant (HCC) 07/19/2013   Immunosuppressed status (HCC) 06/27/2013   H/O pancreas transplant (HCC) 06/27/2013   Diabetic gastroparesis associated with type 1 diabetes mellitus (HCC) 05/08/2013   S/P bilateral BKA (below knee amputation) (HCC) 08/17/2012   Metabolic bone disease 07/20/2011   ESRD (end stage renal disease) (HCC) 11/17/2009   GERD 03/14/2007   Dyslipidemia, goal LDL below 100 02/22/2007   Major depressive disorder, recurrent episode (HCC) 02/22/2007   POST TRAUMATIC STRESS DISORDER 02/22/2007   IMPOTENCE, 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   Diabetes mellitus without complication [facility])    no since pancreas transplant   Dialysis patient [facility]) 04/18/2012   "[facility]; [place], [place], [place]"   Gastroparesis    Gastropathy    GERD (gastroesophageal reflux disease)    Hypertension    MRSA infection    over 10 years ago per patient. in legs    Family History  Problem Relation Age of Onset   Hypertension Mother    Diabetes Mother    Kidney disease Mother    Diabetes Maternal Grandmother    Diabetes Paternal Grandmother    Diabetes Other    Hypertension Other    Lung cancer Maternal Aunt    Colon cancer Neg Hx    Esophageal cancer Neg Hx    Rectal cancer Neg Hx     Stomach cancer Neg Hx     Past Surgical History:  Procedure Laterality Date   AV FISTULA PLACEMENT  08/2011   left upper arm   BELOW KNEE LEG AMPUTATION  "it's been awhile"   bilaterally   CATARACT EXTRACTION  ~ 2011   right   COMBINED KIDNEY-PANCREAS TRANSPLANT  2014   ESOPHAGOGASTRODUODENOSCOPY N/A 12/30/2016   Procedure: ESOPHAGOGASTRODUODENOSCOPY (EGD);  Surgeon: Jeani Hawking, MD;  Location: [facility] ENDOSCOPY;  Service: Endoscopy;  Laterality: N/A;   OLECRANON BURSECTOMY Right 06/23/2020   Procedure: RIGHT ELBOW EXCISION OLECRANON BURSITIS;  Surgeon: Nadara Mustard,  MD;  Location: [facility];  Service: Orthopedics;  Laterality: Right;   Social History   Occupational History   Occupation: Disability  Tobacco Use   Smoking status: Former    Types: Cigars    Quit date: 07/06/2022    Years since quitting: 1.5   Smokeless tobacco: Never   Tobacco comments:    black and mild  Vaping Use   Vaping status: Never Used  Substance and Sexual Activity   Alcohol use: No   Drug use: Not Currently    Frequency: 3.0 times per week    Types: Marijuana    Comment: Occasionally   Sexual activity: Yes    Partners: Female

## 2024-02-10 ENCOUNTER — Other Ambulatory Visit: Payer: Self-pay | Admitting: Internal Medicine

## 2024-02-10 DIAGNOSIS — Z7409 Other reduced mobility: Secondary | ICD-10-CM

## 2024-02-10 DIAGNOSIS — Z89511 Acquired absence of right leg below knee: Secondary | ICD-10-CM

## 2024-02-10 DIAGNOSIS — I639 Cerebral infarction, unspecified: Secondary | ICD-10-CM

## 2024-02-12 DIAGNOSIS — I129 Hypertensive chronic kidney disease with stage 1 through stage 4 chronic kidney disease, or unspecified chronic kidney disease: Secondary | ICD-10-CM | POA: Diagnosis not present

## 2024-02-12 DIAGNOSIS — Z89511 Acquired absence of right leg below knee: Secondary | ICD-10-CM | POA: Diagnosis not present

## 2024-02-12 DIAGNOSIS — N189 Chronic kidney disease, unspecified: Secondary | ICD-10-CM | POA: Diagnosis not present

## 2024-02-12 DIAGNOSIS — E1151 Type 2 diabetes mellitus with diabetic peripheral angiopathy without gangrene: Secondary | ICD-10-CM | POA: Diagnosis not present

## 2024-02-12 DIAGNOSIS — Z7902 Long term (current) use of antithrombotics/antiplatelets: Secondary | ICD-10-CM | POA: Diagnosis not present

## 2024-02-12 DIAGNOSIS — Z7982 Long term (current) use of aspirin: Secondary | ICD-10-CM | POA: Diagnosis not present

## 2024-02-12 DIAGNOSIS — K219 Gastro-esophageal reflux disease without esophagitis: Secondary | ICD-10-CM | POA: Diagnosis not present

## 2024-02-12 DIAGNOSIS — I10 Essential (primary) hypertension: Secondary | ICD-10-CM | POA: Diagnosis not present

## 2024-02-12 DIAGNOSIS — Z7952 Long term (current) use of systemic steroids: Secondary | ICD-10-CM | POA: Diagnosis not present

## 2024-02-12 DIAGNOSIS — Z87891 Personal history of nicotine dependence: Secondary | ICD-10-CM | POA: Diagnosis not present

## 2024-02-12 DIAGNOSIS — I69392 Facial weakness following cerebral infarction: Secondary | ICD-10-CM | POA: Diagnosis not present

## 2024-02-12 DIAGNOSIS — Z89512 Acquired absence of left leg below knee: Secondary | ICD-10-CM | POA: Diagnosis not present

## 2024-02-12 DIAGNOSIS — Z940 Kidney transplant status: Secondary | ICD-10-CM | POA: Diagnosis not present

## 2024-02-12 DIAGNOSIS — I69320 Aphasia following cerebral infarction: Secondary | ICD-10-CM | POA: Diagnosis not present

## 2024-02-12 DIAGNOSIS — E785 Hyperlipidemia, unspecified: Secondary | ICD-10-CM | POA: Diagnosis not present

## 2024-02-12 DIAGNOSIS — Z604 Social exclusion and rejection: Secondary | ICD-10-CM | POA: Diagnosis not present

## 2024-02-12 DIAGNOSIS — Z9483 Pancreas transplant status: Secondary | ICD-10-CM | POA: Diagnosis not present

## 2024-02-12 DIAGNOSIS — Z9181 History of falling: Secondary | ICD-10-CM | POA: Diagnosis not present

## 2024-02-13 ENCOUNTER — Encounter: Payer: Self-pay | Admitting: Neurology

## 2024-02-13 ENCOUNTER — Encounter: Payer: Self-pay | Admitting: Internal Medicine

## 2024-02-13 ENCOUNTER — Telehealth: Payer: Self-pay | Admitting: Neurology

## 2024-02-13 NOTE — Telephone Encounter (Signed)
 Referral for ophthalmology fax to [facility]. Phone: [phone], Fax: [phone]

## 2024-02-13 NOTE — Addendum Note (Signed)
Addended by: Glean Salvo on: 02/13/2024 02:47 PM   Modules accepted: Orders

## 2024-02-13 NOTE — Telephone Encounter (Addendum)
Spoke with Pt wife Elison Worrel. Pt wife stated that he has been having an increase in difficulty swallowing.  Pt had recent EGD with dilation on 06/28/2023. Pt recently had a stroke in September. Pt was scheduled for an office visit on 02/29/2024  at 9:00 AM to see [facility] PA Delaney Meigs verbalized understanding with all questions answered.

## 2024-02-13 NOTE — Telephone Encounter (Signed)
Referral for ophthalmology refax [facility] Ophthalmology as requested by Nurse Practitioner to patient need an earlier appointment. Phone: [phone], Fax: [phone].

## 2024-02-14 ENCOUNTER — Telehealth: Payer: Self-pay | Admitting: Pharmacist

## 2024-02-14 ENCOUNTER — Other Ambulatory Visit: Payer: 59

## 2024-02-14 NOTE — Telephone Encounter (Signed)
Attempting to reach patient/wife for 12:30 clinical pharmacist appt. Left message with direct call back number.  Arbutus Leas, PharmD, BCPS, CPP Clinical Pharmacist Practitioner [facility] [phone]

## 2024-02-20 ENCOUNTER — Other Ambulatory Visit: Payer: Self-pay | Admitting: Internal Medicine

## 2024-02-20 DIAGNOSIS — R221 Localized swelling, mass and lump, neck: Secondary | ICD-10-CM | POA: Insufficient documentation

## 2024-02-21 ENCOUNTER — Encounter (INDEPENDENT_AMBULATORY_CARE_PROVIDER_SITE_OTHER): Payer: Self-pay | Admitting: Otolaryngology

## 2024-02-21 DIAGNOSIS — Z940 Kidney transplant status: Secondary | ICD-10-CM | POA: Diagnosis not present

## 2024-02-22 ENCOUNTER — Other Ambulatory Visit: Payer: Self-pay

## 2024-02-22 DIAGNOSIS — G8191 Hemiplegia, unspecified affecting right dominant side: Secondary | ICD-10-CM | POA: Diagnosis not present

## 2024-02-22 DIAGNOSIS — G890 Central pain syndrome: Secondary | ICD-10-CM | POA: Diagnosis not present

## 2024-02-22 DIAGNOSIS — S88119A Complete traumatic amputation at level between knee and ankle, unspecified lower leg, initial encounter: Secondary | ICD-10-CM | POA: Diagnosis not present

## 2024-02-22 DIAGNOSIS — G546 Phantom limb syndrome with pain: Secondary | ICD-10-CM | POA: Diagnosis not present

## 2024-02-22 DIAGNOSIS — G894 Chronic pain syndrome: Secondary | ICD-10-CM | POA: Diagnosis not present

## 2024-02-22 DIAGNOSIS — Z9483 Pancreas transplant status: Secondary | ICD-10-CM | POA: Diagnosis not present

## 2024-02-22 DIAGNOSIS — N1831 Chronic kidney disease, stage 3a: Secondary | ICD-10-CM | POA: Diagnosis not present

## 2024-02-22 MED ORDER — OXYCODONE HCL 10 MG PO TABS
5.0000 mg | ORAL_TABLET | Freq: Every day | ORAL | 0 refills | Status: DC
  Filled 2024-02-22: qty 150, 30d supply, fill #0

## 2024-02-23 ENCOUNTER — Other Ambulatory Visit: Payer: Self-pay

## 2024-02-23 DIAGNOSIS — I639 Cerebral infarction, unspecified: Secondary | ICD-10-CM | POA: Diagnosis not present

## 2024-02-23 DIAGNOSIS — G8191 Hemiplegia, unspecified affecting right dominant side: Secondary | ICD-10-CM | POA: Diagnosis not present

## 2024-02-23 DIAGNOSIS — Z89519 Acquired absence of unspecified leg below knee: Secondary | ICD-10-CM | POA: Diagnosis not present

## 2024-02-26 ENCOUNTER — Other Ambulatory Visit: Payer: Self-pay

## 2024-02-27 ENCOUNTER — Other Ambulatory Visit: Payer: Self-pay

## 2024-02-27 DIAGNOSIS — Z79899 Other long term (current) drug therapy: Secondary | ICD-10-CM | POA: Diagnosis not present

## 2024-02-27 DIAGNOSIS — I129 Hypertensive chronic kidney disease with stage 1 through stage 4 chronic kidney disease, or unspecified chronic kidney disease: Secondary | ICD-10-CM | POA: Diagnosis not present

## 2024-02-27 DIAGNOSIS — Z940 Kidney transplant status: Secondary | ICD-10-CM | POA: Diagnosis not present

## 2024-02-27 DIAGNOSIS — N390 Urinary tract infection, site not specified: Secondary | ICD-10-CM | POA: Diagnosis not present

## 2024-02-27 DIAGNOSIS — D751 Secondary polycythemia: Secondary | ICD-10-CM | POA: Diagnosis not present

## 2024-02-27 DIAGNOSIS — N183 Chronic kidney disease, stage 3 unspecified: Secondary | ICD-10-CM | POA: Diagnosis not present

## 2024-02-27 DIAGNOSIS — I639 Cerebral infarction, unspecified: Secondary | ICD-10-CM | POA: Diagnosis not present

## 2024-02-27 DIAGNOSIS — Z9483 Pancreas transplant status: Secondary | ICD-10-CM | POA: Diagnosis not present

## 2024-02-27 DIAGNOSIS — E7800 Pure hypercholesterolemia, unspecified: Secondary | ICD-10-CM | POA: Diagnosis not present

## 2024-02-28 ENCOUNTER — Ambulatory Visit: Payer: 59 | Attending: Internal Medicine | Admitting: Speech Pathology

## 2024-02-28 ENCOUNTER — Other Ambulatory Visit: Payer: Self-pay

## 2024-02-28 ENCOUNTER — Ambulatory Visit: Payer: 59 | Admitting: Occupational Therapy

## 2024-02-28 ENCOUNTER — Encounter: Payer: Self-pay | Admitting: Speech Pathology

## 2024-02-28 ENCOUNTER — Encounter: Payer: Self-pay | Admitting: Occupational Therapy

## 2024-02-28 ENCOUNTER — Encounter: Payer: Self-pay | Admitting: Physical Therapy

## 2024-02-28 ENCOUNTER — Ambulatory Visit: Payer: 59 | Admitting: Physical Therapy

## 2024-02-28 DIAGNOSIS — M6281 Muscle weakness (generalized): Secondary | ICD-10-CM | POA: Insufficient documentation

## 2024-02-28 DIAGNOSIS — Z89512 Acquired absence of left leg below knee: Secondary | ICD-10-CM | POA: Diagnosis not present

## 2024-02-28 DIAGNOSIS — Z7409 Other reduced mobility: Secondary | ICD-10-CM | POA: Diagnosis not present

## 2024-02-28 DIAGNOSIS — R2681 Unsteadiness on feet: Secondary | ICD-10-CM | POA: Insufficient documentation

## 2024-02-28 DIAGNOSIS — R278 Other lack of coordination: Secondary | ICD-10-CM | POA: Diagnosis not present

## 2024-02-28 DIAGNOSIS — Z89511 Acquired absence of right leg below knee: Secondary | ICD-10-CM | POA: Insufficient documentation

## 2024-02-28 DIAGNOSIS — Z789 Other specified health status: Secondary | ICD-10-CM | POA: Diagnosis not present

## 2024-02-28 DIAGNOSIS — Z9181 History of falling: Secondary | ICD-10-CM | POA: Insufficient documentation

## 2024-02-28 DIAGNOSIS — R41841 Cognitive communication deficit: Secondary | ICD-10-CM

## 2024-02-28 DIAGNOSIS — I639 Cerebral infarction, unspecified: Secondary | ICD-10-CM | POA: Diagnosis not present

## 2024-02-28 DIAGNOSIS — R4701 Aphasia: Secondary | ICD-10-CM

## 2024-02-28 DIAGNOSIS — R2689 Other abnormalities of gait and mobility: Secondary | ICD-10-CM | POA: Insufficient documentation

## 2024-02-28 DIAGNOSIS — R29818 Other symptoms and signs involving the nervous system: Secondary | ICD-10-CM | POA: Diagnosis not present

## 2024-02-28 NOTE — Therapy (Signed)
 OUTPATIENT SPEECH LANGUAGE PATHOLOGY APHASIA EVALUATION   Patient Name: [name] MRN: [id number] DOB:[birth year], 48 y.o., male Today's Date: 02/28/2024  PCP: [name], MD REFERRING PROVIDER: [name], MD  END OF SESSION:  End of Session - 02/28/24 1535     Visit Number 1    Number of Visits 25    Date for SLP Re-Evaluation 05/22/24    SLP Start Time 1315    SLP Stop Time  1400    SLP Time Calculation (min) 45 min    Activity Tolerance Patient tolerated treatment well             Past Medical History:  Diagnosis Date   AMPUTATION, BELOW KNEE, 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   Diabetes mellitus without complication [facility])    no since pancreas transplant   Dialysis patient [facility]) 04/18/2012   "[facility]; [place], [place], Sat"   Gastroparesis    Gastropathy    GERD (gastroesophageal reflux disease)    Hypertension    MRSA infection    over 10 years ago per patient. in legs   Past Surgical History:  Procedure Laterality Date   AV FISTULA PLACEMENT  08/2011   left upper arm   BELOW KNEE LEG AMPUTATION  "it's been awhile"   bilaterally   CATARACT EXTRACTION  ~ 2011   right   COMBINED KIDNEY-PANCREAS TRANSPLANT  2014   ESOPHAGOGASTRODUODENOSCOPY N/A 12/30/2016   Procedure: ESOPHAGOGASTRODUODENOSCOPY (EGD);  Surgeon: Jeani Hawking, MD;  Location: [facility] ENDOSCOPY;  Service: Endoscopy;  Laterality: N/A;   OLECRANON BURSECTOMY Right 06/23/2020   Procedure: RIGHT ELBOW EXCISION OLECRANON BURSITIS;  Surgeon: Nadara Mustard, MD;  Location: [facility];  Service: Orthopedics;  Laterality: Right;   Patient Active Problem List   Diagnosis Date Noted   Mass of right side of neck 02/20/2024   Ischemic cerebrovascular accident (CVA) due to global hypoperfusion with watershed  infarction [facility]) 09/20/2023   Hypertension secondary to other renal disorders 09/18/2023   PAD (peripheral artery disease) (HCC) 09/04/2023   Diabetes mellitus type II, controlled (HCC) 09/04/2023   Abnormal electrocardiogram (ECG) (EKG) 07/20/2023   Chronic pain 07/04/2023   Tobacco dependence 07/04/2023   Marijuana dependence (HCC) 07/04/2023   Screen for colon cancer 01/10/2022   Balanitis 01/10/2022   Encounter for general adult medical examination with abnormal findings 01/10/2022   Screening for prostate cancer 01/10/2022   Healthcare maintenance 08/26/2016   Impaired mobility and activities of daily living 07/03/2014   Renal transplant, status post 07/19/2013   History of pancreas transplant (HCC) 07/19/2013   Immunosuppressed status (HCC) 06/27/2013   H/O pancreas transplant (HCC) 06/27/2013   Diabetic gastroparesis associated with type 1 diabetes mellitus (HCC) 05/08/2013   S/P bilateral BKA (below knee amputation) (HCC) 08/17/2012   Metabolic bone disease 07/20/2011   ESRD (end stage renal disease) (HCC) 11/17/2009   GERD 03/14/2007   Dyslipidemia, goal LDL below 100 02/22/2007   Major depressive disorder, recurrent episode (HCC) 02/22/2007   POST TRAUMATIC STRESS DISORDER 02/22/2007   IMPOTENCE, 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    ONSET DATE: 09/17/24   REFERRING DIAG: Q46.962,X52.841 (ICD-10-CM) - S/P bilateral BKA (below knee amputation) (HCC) Z74.09,Z78.9 (ICD-10-CM) - Impaired mobility and activities of daily living I63.9 (  ICD-10-CM) - Ischemic cerebrovascular accident (CVA) due to global hypoperfusion with watershed infarction [facility])  THERAPY DIAG:  Aphasia  Cognitive communication deficit  Rationale for Evaluation and Treatment: Rehabilitation  SUBJECTIVE:   SUBJECTIVE STATEMENT: "They couldn't understand me right after the strokes" Pt accompanied by: significant other [name]  PERTINENT HISTORY: Patient is a 48 yo male who presented to ED 9/23 with AMS. Pt found to be  aphasic with a facial droop and a code stroke was activated. Intubated 9/23-9/24. MRI Brain with scattered small foci of acute subcortical infarction in L occipital lobe and posterior L frontal lobe and occluded L ICA. Recently admitted 9/9-9/11 with findings of E coli UTI and poorly controlled HTN. 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 Therapy evaluations completed with recommendations for CIR. Patient admitted 09/29/23. D/C from CIR11/1/24 with [facility] ST/PT/OT.   PAIN:  Are you having pain? Yes: NPRS scale: 8 Pain location: generalized Pain description: ache Aggravating factors: n/a Relieving factors: n/a  FALLS: Has patient fallen in last 6 months?  See PT evaluation for details  LIVING ENVIRONMENT: Lives with: lives with their spouse Lives in: House/apartment  PLOF:  Level of assistance: Independent with ADLs, Independent with IADLs Employment: On disability  PATIENT GOALS: "To make my speech better"  OBJECTIVE:  Note: Objective measures were completed at Evaluation unless otherwise noted.  DIAGNOSTIC FINDINGS:   COGNITION: Overall cognitive status: Impaired Areas of impairment:  Attention: Impaired: Alternating, Divided Memory: Impaired: Short term Auditory Functional deficits:   AUDITORY COMPREHENSION: Overall auditory comprehension: Impaired: moderately complex YES/NO questions: Impaired: moderately complex Following directions: Impaired: moderately complex Conversation: Simple Interfering components:  n/a Effective technique: extra processing time, pausing, repetition/stressing words, and slowed speech  READING COMPREHENSION: Impaired: paragraph  EXPRESSION: verbal  VERBAL EXPRESSION: Level of generative/spontaneous verbalization: conversation Automatic speech: name: intact and social response: intact  Repetition: Impaired: phrase Naming: Confrontation: 51-75% and Divergent:  51-75% Pragmatics: Appears intact Comments: named 12 animals and 4 "m" words in 1 minute Interfering components:  n/a Effective technique: sentence completion and phonemic cues Non-verbal means of communication: N/A  WRITTEN EXPRESSION: Dominant hand: right Written expression: Impaired: word  MOTOR SPEECH: Overall motor speech: impaired Level of impairment: Word and Phrase Respiration: thoracic breathing Phonation: normal Resonance: WFL Articulation: Appears intact Intelligibility: Intelligible Motor planning: Impaired: aware, groping for words, and inconsistent Motor speech errors: aware, groping for words, and inconsistent Interfering components:  n/a Effective technique: slow rate and pause  ORAL MOTOR EXAMINATION: Overall status: WFL Comments:   STANDARDIZED ASSESSMENTS: QAB: Moderate 7.27  PATIENT REPORTED OUTCOME MEASURES (PROM): Communication Participation Item Bank: 15 - Pt. Rated a 0 or very much difficulty saying something quickly and getting a turn in a fast moving conversation. He rated a 1 or quite a bit of difficulty communicating in the community, having a long conversation and giving detailed information  TREATMENT DATE:   Check swallow next visit please  02/28/24: Eval only    PATIENT EDUCATION: Education details: compensations for aphasia; compensations for cognition, See Treatment, See Patient Instructions Person educated: Patient and Spouse Education method: Explanation, Verbal cues, and Handouts Education comprehension: verbal cues required and needs further education   GOALS: Goals reviewed with patient? Yes  SHORT TERM GOALS: Target date: 03/27/24  Pt will name 15 items in personally relevant category Baseline: Goal status: INITIAL  2.  Pt will employ verbal compensations for aphasia in structured naming task 3/5  opportunities with rare min A Baseline:  Goal status: INITIAL  3.  Pt will generate moderately complex sentences in structured language task with occasional min A Baseline:  Goal status: INITIAL  4.  Pt will write a phrase level with occasional min to mod A Baseline:  Goal status: INITIAL  5.  Pt will use compensations for memory to manage am meds with rare min A from sposue Baseline:  Goal status: INITIAL  6.  Pt will use verbal compensations for aphasia during simple conversation 2/4 opportunities with usual mod A Baseline:  Goal status: INITIAL  LONG TERM GOALS: Target date: 05/22/24  Pt will use verbal compensations for aphasia during moderately complex conversation 4/6 opportunities with occasional min A Baseline:  Goal status: INITIAL  2.  Pt will write at sentence level, ID and self correct errors with occasional min A Baseline:  Goal status: INITIAL  3.  Pt will send 3 simple texts with rare min A Baseline:  Goal status: INITIAL  4.  Pt will use compensations for aphasia in detailed or persuasive narrative 4/6 opportunities with occasional min A Baseline:  Goal status: INITIAL  5.  Pt will improve score on Communicative Participation Item Bank Baseline: 15 Goal status: INITIAL   ASSESSMENT:  CLINICAL IMPRESSION: Patient is a 48 y.o. 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 He and his spouse, [name], report difficulty with word finding, semantic paraphasias and difficulty getting words out quickly. Johnathan endorses frustration with communication difficulties as does [name]. [name] is managing his medications at this time. I recommend skilled ST to maximize communication for safety, independence and to reduce caregiver burden.   OBJECTIVE IMPAIRMENTS: include attention, memory, aphasia, and apraxia. These impairments are limiting patient from managing medications, managing appointments, and effectively  communicating at home and in community. Factors affecting potential to achieve goals and functional outcome are co-morbidities. Patient will benefit from skilled SLP services to address above impairments and improve overall function.  REHAB POTENTIAL: Good  PLAN:  SLP FREQUENCY: 2x/week  SLP DURATION: 12 weeks  PLANNED INTERVENTIONS: Language facilitation, Environmental controls, Cueing hierachy, Cognitive reorganization, Internal/external aids, Functional tasks, Multimodal communication approach, and SLP instruction and feedback    Nawal Burling, Radene Journey, CCC-SLP 02/28/2024, 3:46 PM

## 2024-02-28 NOTE — Therapy (Signed)
 OUTPATIENT OCCUPATIONAL THERAPY NEURO EVALUATION  Patient Name: [name] MRN: [id number] DOB:11-25-1976, 48 y.o., male Today's Date: 02/28/2024  PCP: Sanda Linger, MD REFERRING PROVIDER: [name], MD  END OF SESSION:  OT End of Session - 02/28/24 1452     Visit Number 1    Number of Visits 17    Date for OT Re-Evaluation 04/26/24    Authorization Type UHC - Medicare    OT Start Time 1451    OT Stop Time 1530    OT Time Calculation (min) 39 min    Activity Tolerance Patient tolerated treatment well    Behavior During Therapy WFL for tasks assessed/performed            Past Medical History:  Diagnosis Date   AMPUTATION, BELOW KNEE, 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   Diabetes mellitus without complication [facility])    no since pancreas transplant   Dialysis patient [facility]) 04/18/2012   "[facility]; [place], [place], [place]"   Gastroparesis    Gastropathy    GERD (gastroesophageal reflux disease)    Hypertension    MRSA infection    over 10 years ago per patient. in legs   Past Surgical History:  Procedure Laterality Date   AV FISTULA PLACEMENT  08/2011   left upper arm   BELOW KNEE LEG AMPUTATION  "it's been awhile"   bilaterally   CATARACT EXTRACTION  ~ 2011   right   COMBINED KIDNEY-PANCREAS TRANSPLANT  2014   ESOPHAGOGASTRODUODENOSCOPY N/A 12/30/2016   Procedure: ESOPHAGOGASTRODUODENOSCOPY (EGD);  Surgeon: Jeani Hawking, MD;  Location: [facility] ENDOSCOPY;  Service: Endoscopy;  Laterality: N/A;   OLECRANON BURSECTOMY Right 06/23/2020   Procedure: RIGHT ELBOW EXCISION OLECRANON BURSITIS;  Surgeon: Nadara Mustard, MD;  Location: [facility];  Service: Orthopedics;  Laterality: Right;   Patient Active Problem List   Diagnosis Date Noted   Mass of right side of neck 02/20/2024    Ischemic cerebrovascular accident (CVA) due to global hypoperfusion with watershed infarction [facility]) 09/20/2023   Hypertension secondary to other renal disorders 09/18/2023   PAD (peripheral artery disease) (HCC) 09/04/2023   Diabetes mellitus type II, controlled (HCC) 09/04/2023   Abnormal electrocardiogram (ECG) (EKG) 07/20/2023   Chronic pain 07/04/2023   Tobacco dependence 07/04/2023   Marijuana dependence (HCC) 07/04/2023   Screen for colon cancer 01/10/2022   Balanitis 01/10/2022   Encounter for general adult medical examination with abnormal findings 01/10/2022   Screening for prostate cancer 01/10/2022   Healthcare maintenance 08/26/2016   Impaired mobility and activities of Caldwell living 07/03/2014   Renal transplant, status post 07/19/2013   History of pancreas transplant (HCC) 07/19/2013   Immunosuppressed status (HCC) 06/27/2013   H/O pancreas transplant (HCC) 06/27/2013   Diabetic gastroparesis associated with type 1 diabetes mellitus (HCC) 05/08/2013   S/P bilateral BKA (below knee amputation) (HCC) 08/17/2012   Metabolic bone disease 07/20/2011   ESRD (end stage renal disease) (HCC) 11/17/2009   GERD 03/14/2007   Dyslipidemia, goal LDL below 100 02/22/2007   Major depressive disorder, recurrent episode (HCC) 02/22/2007   POST TRAUMATIC STRESS DISORDER 02/22/2007   IMPOTENCE, 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    ONSET DATE: 02/10/2024 (Date of referral)  REFERRING DIAG: Z89.512,Z89.511 (ICD-10-CM) - S/P bilateral  BKA (below knee amputation) (HCC) Z74.09,Z78.9 (ICD-10-CM) - Impaired mobility and activities of Caldwell living I63.9 (ICD-10-CM) - Ischemic cerebrovascular accident (CVA) due to global hypoperfusion with watershed infarction  THERAPY DIAG:  Muscle weakness (generalized)  Acute CVA (cerebrovascular accident) (HCC)  Rationale for Evaluation and Treatment: Rehabilitation  SUBJECTIVE:   SUBJECTIVE STATEMENT: 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   Reports sometimes his vision is  clear as day and other times he has to put his glasses on.   He is scheduled to see a neuro ophthalmologist in May.  Pt accompanied by: significant other - Tamara "[name]"  PERTINENT HISTORY:  PAD, HTN, Strokes, s/p Kidney and Pancreas Transplant, Carotid Stenosis, T1DM, ESRD, bilateral BKA amputee   PRECAUTIONS: None  WEIGHT BEARING RESTRICTIONS: No  PAIN:  Are you having pain? Yes: NPRS scale: 7/10 Pain location: thighs Pain description: depends Aggravating factors: depends Relieving factors: depends  FALLS: Has patient fallen in last 6 months? Yes. Number of falls 1  LIVING ENVIRONMENT: Lives with: lives with their spouse Lives in: House/apartment Stairs: Yes: Internal: 15 steps; on right going up and External: 5 steps; bilateral but cannot reach both Has following equipment at home: Quad cane small base, Walker - 2 wheeled, Wheelchair (manual), and shower chair  PLOF: Independent; was driving;   PATIENT GOALS: get back to PLOF  OBJECTIVE:  Note: Objective measures were completed at Evaluation unless otherwise noted.  HAND DOMINANCE: Right  ADLs: Overall ADLs: mod I   IADLs: Light housekeeping: sweeping, gathers up laundry, dishes  Meal Prep: mod I Community mobility: dependent Medication management: setup Handwriting: 50% legible, Increased time, and due to cognitive impairments  MOBILITY STATUS: Independent - manual wheelchair  ACTIVITY TOLERANCE: Activity tolerance: fair  FUNCTIONAL OUTCOME MEASURES: To be assessed  UPPER EXTREMITY ROM:  Impaired R supination ~ neutral though able to improve to [facility] with increased time and awareness.     BUE - WNL though weaker on R vs L  HAND FUNCTION: Grip strength: Right: 81.5 lbs; Left: 97.8 lbs  COORDINATION: 9 Hole Peg test: Right: 47 sec; Left: 33 sec - mild tremor in R hand  SENSATION: WFL  EDEMA: none reported or observed  MUSCLE TONE:WFL  COGNITION: Overall cognitive status: Impaired - see ST for  further details  VISION: Subjective report: Pt reports vision fluctuates including episodes of double vision and L visual field cut Baseline vision: Bifocals, Wears glasses all the time, and ST bifocals Visual history: cataracts and corrective eye surgery  VISION ASSESSMENT: WFL distance and near acuity; ~45* L visual field  PERCEPTION: Impaired: secondary to visual impairments; pt required cues to locate hand sanitizer on table, reaching to Kleenex originally  PRAXIS: Impaired: Motor planning and potential ideational praxis  OBSERVATIONS: Pt appears well-kept. Glasses hanging on shirt. Pt able to self-propel manual wheelchair.  TREATMENT : N/A for this visit   PATIENT EDUCATION: Education details: OT role and POC Person educated: Patient and Spouse Education method: Explanation Education comprehension: verbalized understanding  HOME EXERCISE PROGRAM: N/A for this visit  GOALS:  SHORT TERM GOALS: Target date: 03/27/2024   Patient will demonstrate initial RUE HEP with 25% verbal cues or less for proper execution. Goal status: INITIAL  2.  Patient will independently recall at least 2 compensatory strategies for visual impairment without cueing.  Goal status: INITIAL  3.  Patient will demonstrate at least 85% accuracy with environmental visual scanning to assist with ADLs and IADLS including potential driving considerations.  Goal status: INITIAL  4.  Pt will independently recall at least 2 tremor reduction strategies.   Baseline:  Goal status: INITIAL  LONG TERM GOALS: Target date: 04/26/2024  Patient will demonstrate updated RUE HEP with visual handouts only for proper execution.  Goal status: INITIAL  2.  Will set appropriate PSFS goal.  Baseline:  Goal status: INITIAL  3.  Patient will demo improved FM coordination as evidenced by completing  nine-hole peg with use of R in 35 seconds or less. Baseline: Right: 47 secs Goal status: INITIAL  4.  Patient will demonstrate at least 88 lbs R grip strength as needed to open jars and other containers. Baseline: Right: 81.5 lbs Goal status: INITIAL  ASSESSMENT:  CLINICAL IMPRESSION: Patient is a 48 y.o. male who was seen today for occupational therapy evaluation for CVA. Hx includes PAD, HTN, Strokes, s/p Kidney and Pancreas Transplant, Carotid Stenosis, T1DM, ESRD, bilateral BKA amputee . Patient currently presents below baseline level of functioning demonstrating functional deficits and impairments as noted below. Pt would benefit from skilled OT services in the outpatient setting to work on impairments as noted below to help pt return to PLOF as able.    PERFORMANCE DEFICITS: in functional skills including ADLs, IADLs, coordination, proprioception, sensation, strength, Fine motor control, decreased knowledge of precautions, decreased knowledge of use of DME, vision, and UE functional use, cognitive skills including attention, memory, and problem solving, and psychosocial skills including coping strategies.   IMPAIRMENTS: are limiting patient from ADLs, IADLs, rest and sleep, leisure, and social participation.   CO-MORBIDITIES: may have co-morbidities  that affects occupational performance. Patient will benefit from skilled OT to address above impairments and improve overall function.  MODIFICATION OR ASSISTANCE TO COMPLETE EVALUATION: Min-Moderate modification of tasks or assist with assess necessary to complete an evaluation.  OT OCCUPATIONAL PROFILE AND HISTORY: Detailed assessment: Review of records and additional review of physical, cognitive, psychosocial history related to current functional performance.  CLINICAL DECISION MAKING: Moderate - several treatment options, min-mod task modification necessary  REHAB POTENTIAL: Fair given comorbidities  EVALUATION COMPLEXITY:  Moderate    PLAN:  OT FREQUENCY: 2x/week  OT DURATION: 8 weeks  PLANNED INTERVENTIONS: 97168 OT Re-evaluation, 97535 self care/ADL training, 08657 therapeutic exercise, 97530 therapeutic activity, 97112 neuromuscular re-education, 97140 manual therapy, visual/perceptual remediation/compensation, coping strategies training, patient/family education, and DME and/or AE instructions  RECOMMENDED OTHER SERVICES: N/A for this visit  CONSULTED AND AGREED WITH PLAN OF CARE: Patient and family member/caregiver  PLAN FOR NEXT SESSION: Set PSFS Goal; tremor reduction strategies/coordination; R theraband HEP; vision compensation, HEP, scanning activities  Delana Meyer, OT 02/28/2024, 3:57 PM

## 2024-02-28 NOTE — Therapy (Signed)
 OUTPATIENT PHYSICAL THERAPY NEURO EVALUATION   Patient Name: [name] MRN: [id number] DOB:October 15, 1976, 48 y.o., male Today's Date: 02/28/2024   PCP: Andrew Grandchild, MD REFERRING PROVIDER: [name], MD  END OF SESSION:  PT End of Session - 02/28/24 1406     Visit Number 1    Number of Visits 17    Date for PT Re-Evaluation 05/01/24    Authorization Type UHC Dual complete    PT Start Time 1404   Handoff w/SLP   PT Stop Time 1435    PT Time Calculation (min) 31 min    Equipment Utilized During Treatment Gait belt;Other (comment)   Bilateral BKA prosthetics   Activity Tolerance Patient tolerated treatment well    Behavior During Therapy WFL for tasks assessed/performed             Past Medical History:  Diagnosis Date   AMPUTATION, BELOW KNEE, 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   Diabetes mellitus without complication [facility])    no since pancreas transplant   Dialysis patient [facility]) 04/18/2012   "[facility]; [place], [place], [place]"   Gastroparesis    Gastropathy    GERD (gastroesophageal reflux disease)    Hypertension    MRSA infection    over 10 years ago per patient. in legs   Past Surgical History:  Procedure Laterality Date   AV FISTULA PLACEMENT  08/2011   left upper arm   BELOW KNEE LEG AMPUTATION  "it's been awhile"   bilaterally   CATARACT EXTRACTION  ~ 2011   right   COMBINED KIDNEY-PANCREAS TRANSPLANT  2014   ESOPHAGOGASTRODUODENOSCOPY N/A 12/30/2016   Procedure: ESOPHAGOGASTRODUODENOSCOPY (EGD);  Surgeon: Jeani Hawking, MD;  Location[facility] ENDOSCOPY;  Service: Endoscopy;  Laterality: N/A;   OLECRANON BURSECTOMY Right 06/23/2020   Procedure: RIGHT ELBOW EXCISION OLECRANON BURSITIS;  Surgeon: Nadara Mustard, MD;  Location: [facility];  Service: Orthopedics;   Laterality: Right;   Patient Active Problem List   Diagnosis Date Noted   Mass of right side of neck 02/20/2024   Ischemic cerebrovascular accident (CVA) due to global hypoperfusion with watershed infarction [facility]) 09/20/2023   Hypertension secondary to other renal disorders 09/18/2023   PAD (peripheral artery disease) (HCC) 09/04/2023   Diabetes mellitus type II, controlled (HCC) 09/04/2023   Abnormal electrocardiogram (ECG) (EKG) 07/20/2023   Chronic pain 07/04/2023   Tobacco dependence 07/04/2023   Marijuana dependence (HCC) 07/04/2023   Screen for colon cancer 01/10/2022   Balanitis 01/10/2022   Encounter for general adult medical examination with abnormal findings 01/10/2022   Screening for prostate cancer 01/10/2022   Healthcare maintenance 08/26/2016   Impaired mobility and activities of daily living 07/03/2014   Renal transplant, status post 07/19/2013   History of pancreas transplant (HCC) 07/19/2013   Immunosuppressed status (HCC) 06/27/2013   H/O pancreas transplant (HCC) 06/27/2013   Diabetic gastroparesis associated with type 1 diabetes mellitus (HCC) 05/08/2013   S/P bilateral BKA (below knee amputation) (HCC) 08/17/2012   Metabolic bone disease 07/20/2011   ESRD (end stage renal disease) (HCC) 11/17/2009   GERD 03/14/2007   Dyslipidemia, goal LDL below 100 02/22/2007   Major depressive disorder, recurrent episode (HCC) 02/22/2007   POST TRAUMATIC STRESS DISORDER 02/22/2007  IMPOTENCE, 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    ONSET DATE: 02/10/2024 (referral)   REFERRING DIAG: [phone] (ICD-10-CM) - S/P bilateral BKA (below knee amputation) (HCC) Z74.09,Z78.9 (ICD-10-CM) - Impaired mobility and activities of daily living I63.9 (ICD-10-CM) - Ischemic cerebrovascular accident (CVA) due to global hypoperfusion with watershed infarction [facility])  THERAPY DIAG:  Unsteadiness on feet  Other abnormalities of gait and mobility  Muscle weakness (generalized)  History of  falling  Rationale for Evaluation and Treatment: Rehabilitation  SUBJECTIVE:                                                                                                                                                                                             SUBJECTIVE STATEMENT: Pt presents in WC, wearing bilateral BKA prosthetics. States he is walking w/a RW.   Pt accompanied by:  Wife, [name]   PERTINENT HISTORY: PAD, HTN, Strokes, s/p Kidney and Pancreas Transplant, Carotid Stenosis, T1DM, ESRD, bilateral BKA amputee   PAIN: "I am always in pain" Are you having pain? Yes: NPRS scale: 7/10 Pain location: Thighs  Pain description: "it just depends on the pain"   PRECAUTIONS: Fall  RED FLAGS: None   WEIGHT BEARING RESTRICTIONS: No  FALLS: Has patient fallen in last 6 months? Yes. Number of falls 1    LIVING ENVIRONMENT: Lives with: lives with their spouse Lives in: House/apartment Stairs: Yes: Internal: 15 steps; on right going up and External: 5 steps; bilateral but cannot reach both Has following equipment at home: Quad cane small base, Environmental consultant - 2 wheeled, Wheelchair (manual), and shower chair  PLOF: Independent  PATIENT GOALS: " To get my walk as close as I can to where I was"   OBJECTIVE:  Note: Objective measures were completed at Evaluation unless otherwise noted.  DIAGNOSTIC FINDINGS: ***  COGNITION: Overall cognitive status:  word-finding difficulty    SENSATION: Pt reports if he takes his prosthetics off, he has a lot of numbness in his distal BLEs   COORDINATION: ***  EDEMA: Pt reports frequent swelling in distal BLEs    POSTURE: rounded shoulders  LOWER EXTREMITY ROM:     Active  Right Eval Left Eval  Hip flexion    Hip extension    Hip abduction    Hip adduction    Hip internal rotation    Hip external rotation    Knee flexion    Knee extension    Ankle dorsiflexion    Ankle plantarflexion    Ankle inversion    Ankle eversion      (Blank rows = not tested)  LOWER EXTREMITY MMT:  Tested in seated position  MMT Right Eval Left Eval  Hip flexion 4+ 5  Hip extension    Hip abduction 3+ 5  Hip adduction 3+ 5  Hip internal rotation    Hip external rotation    Knee flexion 4- 5  Knee extension 4- 5  Ankle dorsiflexion    Ankle plantarflexion    Ankle inversion    Ankle eversion    (Blank rows = not tested)  BED MOBILITY:  Independent per pt   TRANSFERS: Assistive device utilized: Counselling psychologist  Sit to stand: CGA Stand to sit: CGA Chair to chair: {Levels of assistance:24026}   STAIRS: Level of Assistance: {Levels of assistance:24026} Stair Negotiation Technique: {Stair Technique:27161} with {Rail Assistance:27162} Number of Stairs: ***  Height of Stairs: ***  Comments: ***  GAIT: Gait pattern: {gait characteristics:25376} Distance walked: *** Assistive device utilized: {Assistive devices:23999} Level of assistance: {Levels of assistance:24026} Comments: ***  FUNCTIONAL TESTS:   [facility] PT Assessment - 02/28/24 1418       Transfers   Five time sit to stand comments  26.5s   BUE support, LLE posterior, CGA     Ambulation/Gait   Gait velocity 32.8' over 41.57s = 0.8 ft/s or 0.24 m/s   CGA                                                                                                                                        TREATMENT:   Self-care  Stairs   PATIENT EDUCATION: Education details: *** Person educated: {Person educated:25204} Education method: {Education Method:25205} Education comprehension: {Education Comprehension:25206}  HOME EXERCISE PROGRAM: To be established   GOALS: Goals reviewed with patient? Yes  SHORT TERM GOALS: Target date: 03/27/2024   *** Baseline: Goal status: INITIAL  2.  *** Baseline:  Goal status: INITIAL  3.  *** Baseline:  Goal status: INITIAL  4.  *** Baseline:  Goal status: INITIAL  5.  *** Baseline:  Goal status:  INITIAL  6.  *** Baseline:  Goal status: INITIAL  LONG TERM GOALS: Target date: 04/24/2024   *** Baseline:  Goal status: INITIAL  2.  *** Baseline:  Goal status: INITIAL  3.  *** Baseline:  Goal status: INITIAL  4.  *** Baseline:  Goal status: INITIAL  5.  *** Baseline:  Goal status: INITIAL  6.  *** Baseline:  Goal status: INITIAL  ASSESSMENT:  CLINICAL IMPRESSION: Patient is a 48 year old male referred to Neuro OPPT for CVA.   Pt's PMH is significant for: PAD, HTN, Strokes, s/p Kidney and Pancreas Transplant, Carotid Stenosis, T1DM, ESRD and bilateral BKA. The following deficits were present during the exam: ***. Based on ***, pt is an incr risk for falls. Pt would benefit from skilled PT to address these impairments and functional limitations to maximize functional mobility independence   OBJECTIVE IMPAIRMENTS: {opptimpairments:25111}.   ACTIVITY LIMITATIONS: {activitylimitations:27494}  PARTICIPATION LIMITATIONS: {participationrestrictions:25113}  PERSONAL  FACTORS: {Personal factors:25162} are also affecting patient's functional outcome.   REHAB POTENTIAL: {rehabpotential:25112}  CLINICAL DECISION MAKING: {clinical decision making:25114}  EVALUATION COMPLEXITY: {Evaluation complexity:25115}  PLAN:  PT FREQUENCY: 2x/week  PT DURATION: 8 weeks  PLANNED INTERVENTIONS: {rehab planned interventions:25118::"97110-Therapeutic exercises","97530- Therapeutic [phone]- Neuromuscular re-education","97535- Self DGUY","40347- Manual therapy"}  PLAN FOR NEXT SESSION: ***   Manraj Yeo E Sarkis Rhines, PT, DPT 02/28/2024, 2:38 PM

## 2024-02-29 ENCOUNTER — Ambulatory Visit: Payer: 59 | Admitting: Gastroenterology

## 2024-03-01 ENCOUNTER — Telehealth: Payer: Self-pay | Admitting: *Deleted

## 2024-03-01 NOTE — Progress Notes (Signed)
 Complex Care Management Care Guide Note  03/01/2024 Name: [name] MRN: [id number] DOB: 1976-02-18  [name] is a 48 y.o. year old male who is a primary care patient of [name], MD and is actively engaged with the care management team. I reached out to [name] by phone today to assist with re-scheduling  with the Pharmacist.  Follow up plan: Unsuccessful telephone outreach attempt made. A HIPAA compliant phone message was left for the patient providing contact information and requesting a return call.  [name], CMA, Care Guide [facility]  [facility], [facility] Guide Direct Dial: [phone]  Fax: [phone] Website: [email or web address]

## 2024-03-01 NOTE — Addendum Note (Signed)
 Addended by: Delana Meyer on: 03/01/2024 02:57 PM   Modules accepted: Orders

## 2024-03-04 ENCOUNTER — Ambulatory Visit

## 2024-03-04 ENCOUNTER — Ambulatory Visit: Admitting: Occupational Therapy

## 2024-03-04 VITALS — BP 139/65 | HR 70

## 2024-03-04 DIAGNOSIS — Z9181 History of falling: Secondary | ICD-10-CM | POA: Diagnosis not present

## 2024-03-04 DIAGNOSIS — R278 Other lack of coordination: Secondary | ICD-10-CM | POA: Diagnosis not present

## 2024-03-04 DIAGNOSIS — M6281 Muscle weakness (generalized): Secondary | ICD-10-CM

## 2024-03-04 DIAGNOSIS — R2681 Unsteadiness on feet: Secondary | ICD-10-CM | POA: Diagnosis not present

## 2024-03-04 DIAGNOSIS — I639 Cerebral infarction, unspecified: Secondary | ICD-10-CM

## 2024-03-04 DIAGNOSIS — Z7409 Other reduced mobility: Secondary | ICD-10-CM | POA: Diagnosis not present

## 2024-03-04 DIAGNOSIS — R29818 Other symptoms and signs involving the nervous system: Secondary | ICD-10-CM | POA: Diagnosis not present

## 2024-03-04 DIAGNOSIS — R2689 Other abnormalities of gait and mobility: Secondary | ICD-10-CM

## 2024-03-04 DIAGNOSIS — R4701 Aphasia: Secondary | ICD-10-CM | POA: Diagnosis not present

## 2024-03-04 DIAGNOSIS — Z789 Other specified health status: Secondary | ICD-10-CM | POA: Diagnosis not present

## 2024-03-04 DIAGNOSIS — Z89512 Acquired absence of left leg below knee: Secondary | ICD-10-CM | POA: Diagnosis not present

## 2024-03-04 DIAGNOSIS — Z89511 Acquired absence of right leg below knee: Secondary | ICD-10-CM | POA: Diagnosis not present

## 2024-03-04 NOTE — Progress Notes (Signed)
 Complex Care Management Care Guide Note  03/04/2024 Name: [name] MRN: [id number] DOB: 1976/05/27  [name] is a 48 y.o. year old male who is a primary care patient of [name], MD and is actively engaged with the care management team. I reached out to [name] by phone today to assist with re-scheduling  with the Pharmacist.  Follow up plan: Telephone appointment with complex care management team member scheduled for:  03/22/2024  [name], CMA, Care Guide [facility], [facility] Guide Direct Dial: [phone]  Fax: [phone] Website: [email or web address]

## 2024-03-04 NOTE — Therapy (Signed)
 OUTPATIENT PHYSICAL THERAPY NEURO EVALUATION   Patient Name: [name] MRN: [id number] DOB:03/21/1976, 48 y.o., male Today's Date: 03/04/2024   PCP: [name], MD REFERRING PROVIDER: [name], MD  END OF SESSION:  PT End of Session - 03/04/24 1450     Visit Number 2    Number of Visits 17    Date for PT Re-Evaluation 05/01/24    Authorization Type UHC Dual complete    PT Start Time 1448   patient late   PT Stop Time 1527    PT Time Calculation (min) 39 min    Equipment Utilized During Treatment Gait belt    Activity Tolerance Patient tolerated treatment well             Past Medical History:  Diagnosis Date   AMPUTATION, BELOW KNEE, 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   Diabetes mellitus without complication [facility])    no since pancreas transplant   Dialysis patient [facility]) 04/18/2012   "[facility]; [place], [place], [place]"   Gastroparesis    Gastropathy    GERD (gastroesophageal reflux disease)    Hypertension    MRSA infection    over 10 years ago per patient. in legs   Past Surgical History:  Procedure Laterality Date   AV FISTULA PLACEMENT  08/2011   left upper arm   BELOW KNEE LEG AMPUTATION  "it's been awhile"   bilaterally   CATARACT EXTRACTION  ~ 2011   right   COMBINED KIDNEY-PANCREAS TRANSPLANT  2014   ESOPHAGOGASTRODUODENOSCOPY N/A 12/30/2016   Procedure: ESOPHAGOGASTRODUODENOSCOPY (EGD);  Surgeon: Jeani Hawking, MD;  Location: [facility] ENDOSCOPY;  Service: Endoscopy;  Laterality: N/A;   OLECRANON BURSECTOMY Right 06/23/2020   Procedure: RIGHT ELBOW EXCISION OLECRANON BURSITIS;  Surgeon: Nadara Mustard, MD;  Location: [facility];  Service: Orthopedics;  Laterality: Right;   Patient Active Problem List   Diagnosis Date Noted   Mass of right side of neck  02/20/2024   Ischemic cerebrovascular accident (CVA) due to global hypoperfusion with watershed infarction [facility]) 09/20/2023   Hypertension secondary to other renal disorders 09/18/2023   PAD (peripheral artery disease) (HCC) 09/04/2023   Diabetes mellitus type II, controlled (HCC) 09/04/2023   Abnormal electrocardiogram (ECG) (EKG) 07/20/2023   Chronic pain 07/04/2023   Tobacco dependence 07/04/2023   Marijuana dependence (HCC) 07/04/2023   Screen for colon cancer 01/10/2022   Balanitis 01/10/2022   Encounter for general adult medical examination with abnormal findings 01/10/2022   Screening for prostate cancer 01/10/2022   Healthcare maintenance 08/26/2016   Impaired mobility and activities of daily living 07/03/2014   Renal transplant, status post 07/19/2013   History of pancreas transplant (HCC) 07/19/2013   Immunosuppressed status (HCC) 06/27/2013   H/O pancreas transplant (HCC) 06/27/2013   Diabetic gastroparesis associated with type 1 diabetes mellitus (HCC) 05/08/2013   S/P bilateral BKA (below knee amputation) (HCC) 08/17/2012   Metabolic bone disease 07/20/2011   ESRD (end stage renal disease) (HCC) 11/17/2009   GERD 03/14/2007   Dyslipidemia, goal LDL below 100 02/22/2007   Major depressive disorder, recurrent episode (HCC) 02/22/2007   POST TRAUMATIC STRESS DISORDER 02/22/2007   IMPOTENCE, 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    ONSET DATE: 02/10/2024 (referral)   REFERRING DIAG:  [phone] (ICD-10-CM) - S/P bilateral BKA (below knee amputation) (HCC) Z74.09,Z78.9 (ICD-10-CM) - Impaired mobility and activities of daily living I63.9 (ICD-10-CM) - Ischemic cerebrovascular accident (CVA) due to global hypoperfusion with watershed infarction [facility])  THERAPY DIAG:  Muscle weakness (generalized)  Unsteadiness on feet  Other abnormalities of gait and mobility  Rationale for Evaluation and Treatment: Rehabilitation  SUBJECTIVE:                                                                                                                                                                                              SUBJECTIVE STATEMENT: Patient arrives to clinic alone, in wc with B prosthetics. Denies falls.    Pt accompanied by:  Wife, [name]   PERTINENT HISTORY: PAD, HTN, Strokes, s/p Kidney and Pancreas Transplant, Carotid Stenosis, T1DM, ESRD, bilateral BKA amputee   PAIN: "I am always in pain" Are you having pain? Yes: NPRS scale: 7/10 Pain location: Thighs  Pain description: "it just depends on the pain"   PRECAUTIONS: Fall    PATIENT GOALS: " To get my walk as close as I can to where I was"   OBJECTIVE:  Note: Objective measures were completed at Evaluation unless otherwise noted.  DIAGNOSTIC FINDINGS: CT of head from 10/11/23  IMPRESSION: Evolving left MCA territory infarcts involving the left frontal and parietal lobes, better seen on recent prior brain MRI. Redemonstrated petechial hemorrhage along the posterior left frontal lobe.  MRI of head from 09/18/23 IMPRESSION: 1. Scattered small foci of acute subcortical infarction in the left occipital lobe and posterior left frontal lobe. 2. Faint diffusion signal abnormality along the left parietal lobe may reflect evolving infarct versus seizure related cytotoxic edema. 3. 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                                                                                                                              TREATMENT:   Therex: -115' with SBQC + CGA/MinA to assess for BP response -seated R LE march over kettlebell  -established HEP (see below)  GAIT: -pre gait: L LE step over 4" hurdle with U UE support on quad cane + CGA with emphasis on longer L step length and increased R LE stability in that semi-tandem position  -carryover to overground gait with SBQC + CGA with noted improvement in equal step length   -~53ft R LE begins to fatigue with  decreasing foot clearance and increased compensatory truncal posterior lean   PATIENT EDUCATION: Education details: established HEP  Person educated: Patient and Spouse Education method: Medical illustrator Education comprehension: verbalized understanding and needs further education  HOME EXERCISE PROGRAM: Access Code: [email or web address] URL: [email or web address] Date: 03/04/2024 Prepared by: Merry Lofty  Exercises - Standing Hip Abduction with Counter Support  - 1 x daily - 7 x weekly - 3 sets - 10 reps - Standing March with Counter Support  - 1 x daily - 7 x weekly - 3 sets - 10 reps - Standing Hip Extension with Counter Support  - 1 x daily - 7 x weekly - 3 sets - 10 reps - Mini Squat with Counter Support  - 1 x daily - 7 x weekly - 3 sets - 10 reps - Sit to Stand with Counter Support  - 1 x daily - 7 x weekly - 3 sets - 5 reps -seated march over   GOALS: Goals reviewed with patient? Yes  SHORT TERM GOALS: Target date: 03/27/2024   Pt will perform floor transfer w/min A for improved fall recovery and safety at home  Baseline: Goal status: INITIAL  2.  Pt will improve 5 x STS to less than or equal to 21 seconds w/SBA to demonstrate improved functional strength and transfer efficiency.   Baseline: 26.5s  Goal status: INITIAL  3.  Pt will improve gait velocity to at least 1.2 ft/s w/LRAD and CGA for improved gait efficiency and independence  Baseline: 0.8 ft/s w/SBQC and CGA Goal status: INITIAL  4.  Pt will ascend/descend 12 steps w/single rail and CGA for improved safety in home environment  Baseline: Min A w/bilateral rails  Goal status: INITIAL   LONG TERM GOALS: Target date: 04/24/2024   Pt will perform floor transfer w/CGA for fall recovery and safety  Baseline:  Goal status: INITIAL  2.  Pt will improve 5 x STS to less than or equal to 18 seconds mod I to demonstrate improved functional strength and transfer efficiency.  Baseline: 26.5s   Goal status: INITIAL  3.  Pt will improve gait velocity to at least 1.5 ft/s w/LRAD and SBA for improved gait efficiency and reduced fall risk   Baseline: 0.8 ft/s w/SBQC and CGA Goal status: INITIAL  4.  Pt will navigate 12 stairs w/single rail and SBA for improved safety in home environment.  Baseline: min A w/bilateral rails  Goal status: INITIAL   ASSESSMENT:  CLINICAL IMPRESSION: Patient seen for skilled PT session with emphasis on gait tx and R LE strengthening. Prescribed initial HEP to target hip strength/stability for carryover to improved gait mechanics and balance. Initially presenting with decreased L step length and prolonged R step length. After pre gait task- improved to relatively equal step length until R LE began to fatigue at ~23ft. Continue POC.    OBJECTIVE IMPAIRMENTS: Abnormal gait, decreased activity tolerance, decreased balance, decreased cognition, decreased endurance, decreased knowledge of condition, decreased knowledge of use of DME, difficulty walking, decreased strength, decreased safety awareness, impaired perceived functional ability, impaired sensation, improper body mechanics, prosthetic dependency , and pain  ACTIVITY LIMITATIONS: carrying, lifting,  bending, standing, squatting, stairs, transfers, bathing, dressing, hygiene/grooming, locomotion level, and caring for others  PARTICIPATION LIMITATIONS: meal prep, cleaning, laundry, medication management, interpersonal relationship, driving, shopping, community activity, and yard work  PERSONAL FACTORS: Fitness, Past/current experiences, and 1-2 comorbidities: bilateral BKA, CVA  are also affecting patient's functional outcome.   REHAB POTENTIAL: Good  CLINICAL DECISION MAKING: Evolving/moderate complexity  EVALUATION COMPLEXITY: Moderate  PLAN:  PT FREQUENCY: 2x/week  PT DURATION: 8 weeks  PLANNED INTERVENTIONS: 97164- PT Re-evaluation, 97110-Therapeutic exercises, 97530- Therapeutic activity,  [id number]- Neuromuscular re-education, 97535- Self Care, 16109- Manual therapy, [id number]- Gait training, [phone]- Prosthetic training, [phone]- Electrical stimulation (manual), Patient/Family education, Balance training, Stair training, Dry Needling, and DME instructions  PLAN FOR NEXT SESSION: Monitor vitals. Stairs, floor transfers, hip strengthening    Westley Foots, PT Westley Foots, PT, DPT, CBIS  03/04/2024, 3:35 PM

## 2024-03-04 NOTE — Progress Notes (Signed)
 Complex Care Management Care Guide Note  03/04/2024 Name: [name] MRN: [id number] DOB: [birth year]  [name] is a 48 y.o. year old male who is a primary care patient of [name], MD and is actively engaged with the care management team. I reached out to [name] by phone today to assist with re-scheduling  with the Pharmacist.  Follow up plan: Unsuccessful telephone outreach attempt made. A HIPAA compliant phone message was left for the patient providing contact information and requesting a return call. No additional outreach attempts will be made due to inability to maintain patient contact.   [name], CMA, Care Guide [facility]  [facility] Guide Direct Dial: [phone]  Fax: [phone] Website: [email or web address]

## 2024-03-04 NOTE — Therapy (Signed)
 OUTPATIENT OCCUPATIONAL THERAPY NEURO Treatment  Patient Name: [name] MRN: [id number] DOB:08/26/1976, 48 y.o., male Today's Date: 03/04/2024  PCP: Sanda Linger, MD REFERRING PROVIDER: [name], MD  END OF SESSION:  OT End of Session - 03/04/24 1622     Visit Number 2    Number of Visits 17    Date for OT Re-Evaluation 04/26/24    Authorization Type UHC - Medicare    OT Start Time 1533    OT Stop Time 1613    OT Time Calculation (min) 40 min    Activity Tolerance Patient tolerated treatment well    Behavior During Therapy WFL for tasks assessed/performed             Past Medical History:  Diagnosis Date   AMPUTATION, BELOW KNEE, 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   Diabetes mellitus without complication [facility])    no since pancreas transplant   Dialysis patient [facility]) 04/18/2012   "[facility]; [place], [place], [place]"   Gastroparesis    Gastropathy    GERD (gastroesophageal reflux disease)    Hypertension    MRSA infection    over 10 years ago per patient. in legs   Past Surgical History:  Procedure Laterality Date   AV FISTULA PLACEMENT  08/2011   left upper arm   BELOW KNEE LEG AMPUTATION  "it's been awhile"   bilaterally   CATARACT EXTRACTION  ~ 2011   right   COMBINED KIDNEY-PANCREAS TRANSPLANT  2014   ESOPHAGOGASTRODUODENOSCOPY N/A 12/30/2016   Procedure: ESOPHAGOGASTRODUODENOSCOPY (EGD);  Surgeon: Jeani Hawking, MD;  Location: [facility] ENDOSCOPY;  Service: Endoscopy;  Laterality: N/A;   OLECRANON BURSECTOMY Right 06/23/2020   Procedure: RIGHT ELBOW EXCISION OLECRANON BURSITIS;  Surgeon: Nadara Mustard, MD;  Location: [facility];  Service: Orthopedics;  Laterality: Right;   Patient Active Problem List   Diagnosis Date Noted   Mass of right side of neck 02/20/2024    Ischemic cerebrovascular accident (CVA) due to global hypoperfusion with watershed infarction [facility]) 09/20/2023   Hypertension secondary to other renal disorders 09/18/2023   PAD (peripheral artery disease) (HCC) 09/04/2023   Diabetes mellitus type II, controlled (HCC) 09/04/2023   Abnormal electrocardiogram (ECG) (EKG) 07/20/2023   Chronic pain 07/04/2023   Tobacco dependence 07/04/2023   Marijuana dependence (HCC) 07/04/2023   Screen for colon cancer 01/10/2022   Balanitis 01/10/2022   Encounter for general adult medical examination with abnormal findings 01/10/2022   Screening for prostate cancer 01/10/2022   Healthcare maintenance 08/26/2016   Impaired mobility and activities of daily living 07/03/2014   Renal transplant, status post 07/19/2013   History of pancreas transplant (HCC) 07/19/2013   Immunosuppressed status (HCC) 06/27/2013   H/O pancreas transplant (HCC) 06/27/2013   Diabetic gastroparesis associated with type 1 diabetes mellitus (HCC) 05/08/2013   S/P bilateral BKA (below knee amputation) (HCC) 08/17/2012   Metabolic bone disease 07/20/2011   ESRD (end stage renal disease) (HCC) 11/17/2009   GERD 03/14/2007   Dyslipidemia, goal LDL below 100 02/22/2007   Major depressive disorder, recurrent episode (HCC) 02/22/2007   POST TRAUMATIC STRESS DISORDER 02/22/2007   IMPOTENCE, 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    ONSET DATE: 02/10/2024 (Date of referral)  REFERRING DIAG: Z89.512,Z89.511 (ICD-10-CM) - S/P  bilateral BKA (below knee amputation) (HCC) Z74.09,Z78.9 (ICD-10-CM) - Impaired mobility and activities of daily living I63.9 (ICD-10-CM) - Ischemic cerebrovascular accident (CVA) due to global hypoperfusion with watershed infarction  THERAPY DIAG:  Muscle weakness (generalized)  Acute CVA (cerebrovascular accident) (HCC)  Rationale for Evaluation and Treatment: Rehabilitation  SUBJECTIVE:   SUBJECTIVE STATEMENT: From eval: 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   Pt reported some  pain today, see below.  Pt accompanied by: self,  (significant other - Tamara "[name]" dropped off pt)  PERTINENT HISTORY:  PAD, HTN, Strokes, s/p Kidney and Pancreas Transplant, Carotid Stenosis, T1DM, ESRD, bilateral BKA amputee   PRECAUTIONS: None  WEIGHT BEARING RESTRICTIONS: No  PAIN:  Are you having pain? Yes: NPRS scale: 7/10 Pain location: B thighs and mid back Pain description: depends Aggravating factors: depends Relieving factors: depends  FALLS: Has patient fallen in last 6 months? Yes. Number of falls 1  LIVING ENVIRONMENT: Lives with: lives with their spouse Lives in: House/apartment Stairs: Yes: Internal: 15 steps; on right going up and External: 5 steps; bilateral but cannot reach both Has following equipment at home: Quad cane small base, Walker - 2 wheeled, Wheelchair (manual), and shower chair  PLOF: Independent; was driving;   PATIENT GOALS: get back to PLOF  OBJECTIVE:  Note: Objective measures were completed at Evaluation unless otherwise noted.  HAND DOMINANCE: Right  ADLs: Overall ADLs: mod I   IADLs: Light housekeeping: sweeping, gathers up laundry, dishes  Meal Prep: mod I Community mobility: dependent Medication management: setup Handwriting: 50% legible, Increased time, and due to cognitive impairments  MOBILITY STATUS: Independent - manual wheelchair  ACTIVITY TOLERANCE: Activity tolerance: fair  FUNCTIONAL OUTCOME MEASURES:  03/04/24 - PSFS - 3.3   Total score = sum of the activity scores/number of activities Minimum detectable change (90%CI) for average score = 2 points Minimum detectable change (90%CI) for single activity score = 3 points   UPPER EXTREMITY ROM:  Impaired R supination ~ neutral though able to improve to [facility] with increased time and awareness.     BUE - WNL though weaker on R vs L  HAND FUNCTION: Grip strength: Right: 81.5 lbs; Left: 97.8 lbs  COORDINATION: 9 Hole Peg test: Right: 47 sec; Left: 33 sec -  mild tremor in R hand  SENSATION: WFL  EDEMA: none reported or observed  MUSCLE TONE:WFL  COGNITION: Overall cognitive status: Impaired - see ST for further details  VISION: Subjective report: Pt reports vision fluctuates including episodes of double vision and L visual field cut Baseline vision: Bifocals, Wears glasses all the time, and ST bifocals Visual history: cataracts and corrective eye surgery  VISION ASSESSMENT: WFL distance and near acuity; ~45* L visual field  PERCEPTION: Impaired: secondary to visual impairments; pt required cues to locate hand sanitizer on table, reaching to Kleenex originally  PRAXIS: Impaired: Motor planning and potential ideational praxis  OBSERVATIONS: Pt appears well-kept. Glasses hanging on shirt. Pt able to self-propel manual wheelchair.  TREATMENT :  Self-Care OT educated pt on tremor compensation strategies. Handout provided, see pt instructions. OT provided pt with foam tubing for built-up eating utensils and tripod pencil grip for writing utensils. OT provided additional notes and diagrams of strategies today to improve carryover to home.  Scooping food task with attention to tremor compensation strategies, using bowl and built-up eating utensil - to improve understanding of tremor compensation strategies, to improve FM coordination and dexterity of RUE, to improve sequencing and cognition for steps of task. Pt benefited from mod v/c to stabilize elbows against body secondary to cognition.  TherAct PSFS assessed and associated goal added, see objective measures above and goals below.  Handwriting - writing simple words using tremor compensation strategies re: weighted pen. - to improve sequencing and cognition for handwriting, to improve FM coordination and dexterity for handwriting, to improve understanding of tremor  compensation strategies for handwriting. Pt demo'd 100% accuracy with spelling, 100% legibility with 3-letter words, and approx. 80%-90% alignment with baseline. OT drew diagram of weighted pen and per OT reocmmendation, pt took pictures on phone of weighted pen to improve carryover.   PATIENT EDUCATION: Education details: see today's tx above Person educated: Patient and Spouse Education method: Explanation Education comprehension: verbalized understanding  HOME EXERCISE PROGRAM: 03/04/24 - tremor compensation strategies  GOALS:  SHORT TERM GOALS: Target date: 03/27/2024   Patient will demonstrate initial RUE HEP with 25% verbal cues or less for proper execution. Goal status: INITIAL  2.  Patient will independently recall at least 2 compensatory strategies for visual impairment without cueing.  Goal status: INITIAL  3.  Patient will demonstrate at least 85% accuracy with environmental visual scanning to assist with ADLs and IADLS including potential driving considerations.  Goal status: INITIAL  4.  Pt will independently recall at least 2 tremor reduction strategies.   Baseline:  03/04/24 - OT educated pt on tremor compensation strategies. Pt returned demo. Goal status: in progress  LONG TERM GOALS: Target date: 04/26/2024  Patient will demonstrate updated RUE HEP with visual handouts only for proper execution.  Goal status: INITIAL  2.  03/04/24 revised - Patient will report at least two-point increase in average PSFS score or at least three-point increase in a single activity score indicating functionally significant improvement given minimum detectable change. Baseline: PSFS - 3.3 total score (See above for individual activity scores)  Goal status: INITIAL  3.  Patient will demo improved FM coordination as evidenced by completing nine-hole peg with use of R in 35 seconds or less. Baseline: Right: 47 secs Goal status: INITIAL  4.  Patient will demonstrate at least 88 lbs R grip  strength as needed to open jars and other containers. Baseline: Right: 81.5 lbs Goal status: INITIAL  ASSESSMENT:  CLINICAL IMPRESSION: Pt tolerated tasks well, benefiting from extra time and additional v/c secondary to cognitive deficits. OT provided pt with pictures and additional handwritten instructions to improve carryover. Pt would benefit from skilled OT services in the outpatient setting to work on impairments as noted below to help pt return to PLOF as able.    PERFORMANCE DEFICITS: in functional skills including ADLs, IADLs, coordination, proprioception, sensation, strength, Fine motor control, decreased knowledge of precautions, decreased knowledge of use of DME, vision, and UE functional use, cognitive skills including attention, memory, and problem solving, and psychosocial skills including coping strategies.   IMPAIRMENTS: are limiting patient from ADLs, IADLs, rest and sleep, leisure, and social participation.   CO-MORBIDITIES: may have co-morbidities  that  affects occupational performance. Patient will benefit from skilled OT to address above impairments and improve overall function.  MODIFICATION OR ASSISTANCE TO COMPLETE EVALUATION: Min-Moderate modification of tasks or assist with assess necessary to complete an evaluation.  OT OCCUPATIONAL PROFILE AND HISTORY: Detailed assessment: Review of records and additional review of physical, cognitive, psychosocial history related to current functional performance.  CLINICAL DECISION MAKING: Moderate - several treatment options, min-mod task modification necessary  REHAB POTENTIAL: Fair given comorbidities  EVALUATION COMPLEXITY: Moderate    PLAN:  OT FREQUENCY: 2x/week  OT DURATION: 8 weeks  PLANNED INTERVENTIONS: 97168 OT Re-evaluation, 97535 self care/ADL training, 65784 therapeutic exercise, 97530 therapeutic activity, 97112 neuromuscular re-education, 97140 manual therapy, visual/perceptual remediation/compensation,  coping strategies training, patient/family education, and DME and/or AE instructions  RECOMMENDED OTHER SERVICES: N/A for this visit  CONSULTED AND AGREED WITH PLAN OF CARE: Patient and family member/caregiver  PLAN FOR NEXT SESSION: tremor reduction strategies - review, FM coordination HEP; R theraband HEP; vision compensation, HEP, scanning activities  Wynetta Emery, OT 03/04/2024, 4:27 PM

## 2024-03-11 ENCOUNTER — Ambulatory Visit: Admitting: Occupational Therapy

## 2024-03-11 ENCOUNTER — Ambulatory Visit

## 2024-03-11 VITALS — BP 169/77 | HR 65

## 2024-03-11 DIAGNOSIS — R2681 Unsteadiness on feet: Secondary | ICD-10-CM | POA: Diagnosis not present

## 2024-03-11 DIAGNOSIS — R29818 Other symptoms and signs involving the nervous system: Secondary | ICD-10-CM | POA: Diagnosis not present

## 2024-03-11 DIAGNOSIS — Z789 Other specified health status: Secondary | ICD-10-CM | POA: Diagnosis not present

## 2024-03-11 DIAGNOSIS — R278 Other lack of coordination: Secondary | ICD-10-CM | POA: Diagnosis not present

## 2024-03-11 DIAGNOSIS — I639 Cerebral infarction, unspecified: Secondary | ICD-10-CM | POA: Diagnosis not present

## 2024-03-11 DIAGNOSIS — R2689 Other abnormalities of gait and mobility: Secondary | ICD-10-CM

## 2024-03-11 DIAGNOSIS — M6281 Muscle weakness (generalized): Secondary | ICD-10-CM

## 2024-03-11 DIAGNOSIS — Z89511 Acquired absence of right leg below knee: Secondary | ICD-10-CM | POA: Diagnosis not present

## 2024-03-11 DIAGNOSIS — R41841 Cognitive communication deficit: Secondary | ICD-10-CM

## 2024-03-11 DIAGNOSIS — Z7409 Other reduced mobility: Secondary | ICD-10-CM | POA: Diagnosis not present

## 2024-03-11 DIAGNOSIS — Z9181 History of falling: Secondary | ICD-10-CM | POA: Diagnosis not present

## 2024-03-11 DIAGNOSIS — R4701 Aphasia: Secondary | ICD-10-CM | POA: Diagnosis not present

## 2024-03-11 DIAGNOSIS — Z89512 Acquired absence of left leg below knee: Secondary | ICD-10-CM | POA: Diagnosis not present

## 2024-03-11 NOTE — Therapy (Signed)
 OUTPATIENT PHYSICAL THERAPY NEURO TREATMENT   Patient Name: [name] MRN: [id number] DOB:02-13-1976, 48 y.o., male Today's Date: 03/11/2024   PCP: [name], MD REFERRING PROVIDER: [name], MD  END OF SESSION:  PT End of Session - 03/11/24 1346     Visit Number 3    Number of Visits 17    Date for PT Re-Evaluation 05/01/24    Authorization Type UHC Dual complete    PT Start Time 1445    PT Stop Time 1527    PT Time Calculation (min) 42 min    Equipment Utilized During Treatment Gait belt    Activity Tolerance Patient tolerated treatment well    Behavior During Therapy WFL for tasks assessed/performed             Past Medical History:  Diagnosis Date   AMPUTATION, BELOW KNEE, 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   Diabetes mellitus without complication [facility])    no since pancreas transplant   Dialysis patient [facility]) 04/18/2012   "[facility]; [place], [place], [place]"   Gastroparesis    Gastropathy    GERD (gastroesophageal reflux disease)    Hypertension    MRSA infection    over 10 years ago per patient. in legs   Past Surgical History:  Procedure Laterality Date   AV FISTULA PLACEMENT  08/2011   left upper arm   BELOW KNEE LEG AMPUTATION  "it's been awhile"   bilaterally   CATARACT EXTRACTION  ~ 2011   right   COMBINED KIDNEY-PANCREAS TRANSPLANT  2014   ESOPHAGOGASTRODUODENOSCOPY N/A 12/30/2016   Procedure: ESOPHAGOGASTRODUODENOSCOPY (EGD);  Surgeon: Jeani Hawking, MD;  Location: [facility] ENDOSCOPY;  Service: Endoscopy;  Laterality: N/A;   OLECRANON BURSECTOMY Right 06/23/2020   Procedure: RIGHT ELBOW EXCISION OLECRANON BURSITIS;  Surgeon: Nadara Mustard, MD;  Location: [facility];  Service: Orthopedics;  Laterality: Right;   Patient Active Problem List    Diagnosis Date Noted   Mass of right side of neck 02/20/2024   Ischemic cerebrovascular accident (CVA) due to global hypoperfusion with watershed infarction [facility]) 09/20/2023   Hypertension secondary to other renal disorders 09/18/2023   PAD (peripheral artery disease) (HCC) 09/04/2023   Diabetes mellitus type II, controlled (HCC) 09/04/2023   Abnormal electrocardiogram (ECG) (EKG) 07/20/2023   Chronic pain 07/04/2023   Tobacco dependence 07/04/2023   Marijuana dependence (HCC) 07/04/2023   Screen for colon cancer 01/10/2022   Balanitis 01/10/2022   Encounter for general adult medical examination with abnormal findings 01/10/2022   Screening for prostate cancer 01/10/2022   Healthcare maintenance 08/26/2016   Impaired mobility and activities of daily living 07/03/2014   Renal transplant, status post 07/19/2013   History of pancreas transplant (HCC) 07/19/2013   Immunosuppressed status (HCC) 06/27/2013   H/O pancreas transplant (HCC) 06/27/2013   Diabetic gastroparesis associated with type 1 diabetes mellitus (HCC) 05/08/2013   S/P bilateral BKA (below knee amputation) (HCC) 08/17/2012   Metabolic bone disease 07/20/2011   ESRD (end stage renal disease) (HCC) 11/17/2009   GERD 03/14/2007   Dyslipidemia, goal LDL below 100 02/22/2007   Major depressive disorder, recurrent episode (HCC) 02/22/2007   POST TRAUMATIC STRESS DISORDER 02/22/2007   IMPOTENCE, 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    ONSET  DATE: 02/10/2024 (referral)   REFERRING DIAG: [phone] (ICD-10-CM) - S/P bilateral BKA (below knee amputation) (HCC) Z74.09,Z78.9 (ICD-10-CM) - Impaired mobility and activities of daily living I63.9 (ICD-10-CM) - Ischemic cerebrovascular accident (CVA) due to global hypoperfusion with watershed infarction [facility])  THERAPY DIAG:  Muscle weakness (generalized)  Unsteadiness on feet  Other abnormalities of gait and mobility  Rationale for Evaluation and Treatment: Rehabilitation  SUBJECTIVE:                                                                                                                                                                                              SUBJECTIVE STATEMENT: Patient reports doing well. Denies falls. Is still using a walker to walk around his house, but is starting to walk shorter distances with no AD.   Pt accompanied by: self  PERTINENT HISTORY: PAD, HTN, Strokes, s/p Kidney and Pancreas Transplant, Carotid Stenosis, T1DM, ESRD, bilateral BKA amputee   PAIN: "I am always in pain" Are you having pain? Yes: NPRS scale: 6/10 Pain location: Thighs  Pain description: "it just depends on the pain"   PRECAUTIONS: Fall  PATIENT GOALS: " To get my walk as close as I can to where I was"   OBJECTIVE:  Note: Objective measures were completed at Evaluation unless otherwise noted.  DIAGNOSTIC FINDINGS: CT of head from 10/11/23  IMPRESSION: Evolving left MCA territory infarcts involving the left frontal and parietal lobes, better seen on recent prior brain MRI. Redemonstrated petechial hemorrhage along the posterior left frontal lobe.  MRI of head from 09/18/23 IMPRESSION: 1. Scattered small foci of acute subcortical infarction in the left occipital lobe and posterior left frontal lobe. 2. Faint diffusion signal abnormality along the left parietal lobe may reflect evolving infarct versus seizure related cytotoxic edema. 3. 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                                                                                                                              TREATMENT:   GAIT: -100' SBQC + CGA -8 min  Scifit level 4 B UE/LE for large amplitude reciprocal coordination carryover to overground gait  -26ft SBQC + CGA -x10, x10, x71ft no AD + MinA  -standing R hip abduction  -standing R HSC (unable) -seated hamstring iso BLE   PATIENT EDUCATION: Education details: cont HEP, use gait belt  + wife if walking around house without AD Person educated: Patient and Spouse Education method: Medical illustrator Education comprehension: verbalized understanding and needs further education  HOME EXERCISE PROGRAM: Access Code: [email or web address] URL: [email or web address] Date: 03/04/2024 Prepared by: Merry Lofty  Exercises - Standing Hip Abduction with Counter Support  - 1 x daily - 7 x weekly - 3 sets - 10 reps - Standing March with Counter Support  - 1 x daily - 7 x weekly - 3 sets - 10 reps - Standing Hip Extension with Counter Support  - 1 x daily - 7 x weekly - 3 sets - 10 reps - Mini Squat with Counter Support  - 1 x daily - 7 x weekly - 3 sets - 10 reps - Sit to Stand with Counter Support  - 1 x daily - 7 x weekly - 3 sets - 5 reps -seated march over   GOALS: Goals reviewed with patient? Yes  SHORT TERM GOALS: Target date: 03/27/2024   Pt will perform floor transfer w/min A for improved fall recovery and safety at home  Baseline: Goal status: INITIAL  2.  Pt will improve 5 x STS to less than or equal to 21 seconds w/SBA to demonstrate improved functional strength and transfer efficiency.   Baseline: 26.5s  Goal status: INITIAL  3.  Pt will improve gait velocity to at least 1.2 ft/s w/LRAD and CGA for improved gait efficiency and independence  Baseline: 0.8 ft/s w/SBQC and CGA Goal status: INITIAL  4.  Pt will ascend/descend 12 steps w/single rail and CGA for improved safety in home environment  Baseline: Min A w/bilateral rails  Goal status: INITIAL   LONG TERM GOALS: Target date: 04/24/2024   Pt will perform floor transfer w/CGA for fall recovery and safety  Baseline:  Goal status: INITIAL  2.  Pt will improve 5 x STS to less than or equal to 18 seconds mod I to demonstrate improved functional strength and transfer efficiency.  Baseline: 26.5s  Goal status: INITIAL  3.  Pt will improve gait velocity to at least 1.5 ft/s w/LRAD and SBA  for improved gait efficiency and reduced fall risk   Baseline: 0.8 ft/s w/SBQC and CGA Goal status: INITIAL  4.  Pt will navigate 12 stairs w/single rail and SBA for improved safety in home environment.  Baseline: min A w/bilateral rails  Goal status: INITIAL   ASSESSMENT:  CLINICAL IMPRESSION: Patient seen for skilled PT session with emphasis on progressing gait training with and without AD. He demonstrates reasonable carryover from previous sessions focused on equal step length and increasing R foot clearance. With fatigue, patient relies more on L lateral weight shift and R circumduction. Therapeutic tasks targeted hamstring activation in order to improve foot clearance. Patient with minimal AROM in standing, but able to achieve iso contraction in sitting. Continue POC.    OBJECTIVE IMPAIRMENTS: Abnormal gait, decreased activity tolerance, decreased balance, decreased cognition, decreased endurance, decreased knowledge of condition, decreased knowledge of use of DME, difficulty walking, decreased strength, decreased safety awareness, impaired perceived functional ability, impaired sensation, improper body mechanics, prosthetic dependency , and pain  ACTIVITY LIMITATIONS: carrying, lifting, bending, standing, squatting, stairs, transfers, bathing, dressing,  hygiene/grooming, locomotion level, and caring for others  PARTICIPATION LIMITATIONS: meal prep, cleaning, laundry, medication management, interpersonal relationship, driving, shopping, community activity, and yard work  PERSONAL FACTORS: Fitness, Past/current experiences, and 1-2 comorbidities: bilateral BKA, CVA  are also affecting patient's functional outcome.   REHAB POTENTIAL: Good  CLINICAL DECISION MAKING: Evolving/moderate complexity  EVALUATION COMPLEXITY: Moderate  PLAN:  PT FREQUENCY: 2x/week  PT DURATION: 8 weeks  PLANNED INTERVENTIONS: 97164- PT Re-evaluation, 97110-Therapeutic exercises, 97530- Therapeutic  activity, [id number]- Neuromuscular re-education, 97535- Self Care, 16109- Manual therapy, [id number]- Gait training, [phone]- Prosthetic training, [phone]- Electrical stimulation (manual), Patient/Family education, Balance training, Stair training, Dry Needling, and DME instructions  PLAN FOR NEXT SESSION: Monitor vitals. Stairs, floor transfers, hip strengthening, hamstring strengthening, progressing gait with SBQC vs no AD   Westley Foots, PT Westley Foots, PT, DPT, CBIS  03/11/2024, 3:37 PM

## 2024-03-11 NOTE — Patient Instructions (Signed)
 Vision Strategies  1. Look for the edge of objects (to the left and/or right) so that you make sure you are seeing all of an object  2. Turn your head when walking, scan from side to side, particularly in busy environments  3. Use an organized scanning pattern. It's usually easier to scan from top to bottom, and left to right (like you are reading)  4. Double check yourself  5. Use a line guide (like a blank piece of paper) or your finger when reading  6. If necessary, place brightly colored tape at end of table or work area as a reminder to always look until you see the tape.    Activities to try at home to encourage visual scanning:   1. Word searches 2. Mazes 3. Puzzles 4. Card games 5. Computer games and/or searches 6. Connect-the-dots  Activities for environmental (larger) scanning:   1. With supervision, scan for items in grocery store or drugstore.  Begin with a familiar store, then progress to a new store you've never been in before. Make sure you have supervision with this.   2. With supervision, tell a family member or caregiver when it is safe to cross a street after looking all directions and any side streets. However, do NOT cross street unless family member or caregiver is with you and says it is OK

## 2024-03-11 NOTE — Therapy (Signed)
 OUTPATIENT OCCUPATIONAL THERAPY NEURO Treatment  Patient Name: [name] MRN: [id number] DOB:11-01-1976, 48 y.o., male Today's Date: 03/11/2024  PCP: Sanda Linger, MD REFERRING PROVIDER: [name], MD  END OF SESSION:  OT End of Session - 03/11/24 1358     Visit Number 3    Number of Visits 17    Date for OT Re-Evaluation 04/26/24    Authorization Type UHC - Medicare    OT Start Time 1403    OT Stop Time 1445    OT Time Calculation (min) 42 min    Activity Tolerance Patient tolerated treatment well    Behavior During Therapy WFL for tasks assessed/performed              Past Medical History:  Diagnosis Date   AMPUTATION, BELOW KNEE, 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   Diabetes mellitus without complication [facility])    no since pancreas transplant   Dialysis patient [facility]) 04/18/2012   "[facility]; [place], [place], [place]"   Gastroparesis    Gastropathy    GERD (gastroesophageal reflux disease)    Hypertension    MRSA infection    over 10 years ago per patient. in legs   Past Surgical History:  Procedure Laterality Date   AV FISTULA PLACEMENT  08/2011   left upper arm   BELOW KNEE LEG AMPUTATION  "it's been awhile"   bilaterally   CATARACT EXTRACTION  ~ 2011   right   COMBINED KIDNEY-PANCREAS TRANSPLANT  2014   ESOPHAGOGASTRODUODENOSCOPY N/A 12/30/2016   Procedure: ESOPHAGOGASTRODUODENOSCOPY (EGD);  Surgeon: Jeani Hawking, MD;  Location: [facility] ENDOSCOPY;  Service: Endoscopy;  Laterality: N/A;   OLECRANON BURSECTOMY Right 06/23/2020   Procedure: RIGHT ELBOW EXCISION OLECRANON BURSITIS;  Surgeon: Nadara Mustard, MD;  Location: [facility];  Service: Orthopedics;  Laterality: Right;   Patient Active Problem List   Diagnosis Date Noted   Mass of right side of neck  02/20/2024   Ischemic cerebrovascular accident (CVA) due to global hypoperfusion with watershed infarction [facility]) 09/20/2023   Hypertension secondary to other renal disorders 09/18/2023   PAD (peripheral artery disease) (HCC) 09/04/2023   Diabetes mellitus type II, controlled (HCC) 09/04/2023   Abnormal electrocardiogram (ECG) (EKG) 07/20/2023   Chronic pain 07/04/2023   Tobacco dependence 07/04/2023   Marijuana dependence (HCC) 07/04/2023   Screen for colon cancer 01/10/2022   Balanitis 01/10/2022   Encounter for general adult medical examination with abnormal findings 01/10/2022   Screening for prostate cancer 01/10/2022   Healthcare maintenance 08/26/2016   Impaired mobility and activities of daily living 07/03/2014   Renal transplant, status post 07/19/2013   History of pancreas transplant (HCC) 07/19/2013   Immunosuppressed status (HCC) 06/27/2013   H/O pancreas transplant (HCC) 06/27/2013   Diabetic gastroparesis associated with type 1 diabetes mellitus (HCC) 05/08/2013   S/P bilateral BKA (below knee amputation) (HCC) 08/17/2012   Metabolic bone disease 07/20/2011   ESRD (end stage renal disease) (HCC) 11/17/2009   GERD 03/14/2007   Dyslipidemia, goal LDL below 100 02/22/2007   Major depressive disorder, recurrent episode (HCC) 02/22/2007   POST TRAUMATIC STRESS DISORDER 02/22/2007   IMPOTENCE, 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    ONSET DATE: 02/10/2024 (Date of referral)  REFERRING DIAG: Z89.512,Z89.511 (ICD-10-CM) -  S/P bilateral BKA (below knee amputation) (HCC) Z74.09,Z78.9 (ICD-10-CM) - Impaired mobility and activities of daily living I63.9 (ICD-10-CM) - Ischemic cerebrovascular accident (CVA) due to global hypoperfusion with watershed infarction  THERAPY DIAG:  Muscle weakness (generalized)  Acute CVA (cerebrovascular accident) (HCC)  Cognitive communication deficit  Ischemic cerebrovascular accident (CVA) due to global hypoperfusion with watershed infarction [facility])  Rationale  for Evaluation and Treatment: Rehabilitation  SUBJECTIVE:   SUBJECTIVE STATEMENT: From eval: 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   He has not practiced all the strategies from his last session but did elevate his plate for eating.   Pt accompanied by: self  PERTINENT HISTORY:  PAD, HTN, Strokes, s/p Kidney and Pancreas Transplant, Carotid Stenosis, T1DM, ESRD, bilateral BKA amputee   PRECAUTIONS: None  WEIGHT BEARING RESTRICTIONS: No  PAIN:  Are you having pain? Yes: NPRS scale: 6/10 Pain location: B thighs and mid back Pain description: depends Aggravating factors: depends Relieving factors: depends  FALLS: Has patient fallen in last 6 months? Yes. Number of falls 1  LIVING ENVIRONMENT: Lives with: lives with their spouse Lives in: House/apartment Stairs: Yes: Internal: 15 steps; on right going up and External: 5 steps; bilateral but cannot reach both Has following equipment at home: Quad cane small base, Walker - 2 wheeled, Wheelchair (manual), and shower chair  PLOF: Independent; was driving;   PATIENT GOALS: get back to PLOF  OBJECTIVE:  Note: Objective measures were completed at Evaluation unless otherwise noted.  HAND DOMINANCE: Right  ADLs: Overall ADLs: mod I   IADLs: Light housekeeping: sweeping, gathers up laundry, dishes  Meal Prep: mod I Community mobility: dependent Medication management: setup Handwriting: 50% legible, Increased time, and due to cognitive impairments  MOBILITY STATUS: Independent - manual wheelchair  ACTIVITY TOLERANCE: Activity tolerance: fair  FUNCTIONAL OUTCOME MEASURES:  03/04/24 - PSFS - 3.3   Total score = sum of the activity scores/number of activities Minimum detectable change (90%CI) for average score = 2 points Minimum detectable change (90%CI) for single activity score = 3 points   UPPER EXTREMITY ROM:  Impaired R supination ~ neutral though able to improve to [facility] with increased time and awareness.     BUE - WNL  though weaker on R vs L  HAND FUNCTION: Grip strength: Right: 81.5 lbs; Left: 97.8 lbs  COORDINATION: 9 Hole Peg test: Right: 47 sec; Left: 33 sec - mild tremor in R hand  SENSATION: WFL  EDEMA: none reported or observed  MUSCLE TONE:WFL  COGNITION: Overall cognitive status: Impaired - see ST for further details  VISION: Subjective report: Pt reports vision fluctuates including episodes of double vision and L visual field cut Baseline vision: Bifocals, Wears glasses all the time, and ST bifocals Visual history: cataracts and corrective eye surgery  VISION ASSESSMENT: WFL distance and near acuity; ~45* L visual field  PERCEPTION: Impaired: secondary to visual impairments; pt required cues to locate hand sanitizer on table, reaching to Kleenex originally  PRAXIS: Impaired: Motor planning and potential ideational praxis  OBSERVATIONS: Pt appears well-kept. Glasses hanging on shirt. Pt able to self-propel manual wheelchair.  TREATMENT :  Self-Care OT reviewed tremor compensation strategies, see pt instructions. Pt required mod cueing for proper recall.   OT initiated vision strategies and visual scanning as noted in pt instructions as needed to improve safety and accuracy with completion of functional activities.   PATIENT EDUCATION: Education details: see today's tx above Person educated: Patient Education method: Explanation, Demonstration, Verbal cues, and Handouts Education comprehension: verbalized understanding, returned demonstration, verbal cues required, and needs further education  HOME EXERCISE PROGRAM: 03/04/24 - tremor compensation strategies 03/11/2024: vision strategies and visual scanning  GOALS:  SHORT TERM GOALS: Target date: 03/27/2024   Patient will demonstrate initial RUE HEP with 25% verbal cues or less for proper  execution. Goal status: INITIAL  2.  Patient will independently recall at least 2 compensatory strategies for visual impairment without cueing.  Goal status: INITIAL  3.  Patient will demonstrate at least 85% accuracy with environmental visual scanning to assist with ADLs and IADLS including potential driving considerations.  Goal status: INITIAL  4.  Pt will independently recall at least 2 tremor reduction strategies.   Baseline:  03/04/24 - OT educated pt on tremor compensation strategies. Pt returned demo. Goal status: in progress  LONG TERM GOALS: Target date: 04/26/2024  Patient will demonstrate updated RUE HEP with visual handouts only for proper execution.  Goal status: INITIAL  2.  03/04/24 revised - Patient will report at least two-point increase in average PSFS score or at least three-point increase in a single activity score indicating functionally significant improvement given minimum detectable change. Baseline: PSFS - 3.3 total score (See above for individual activity scores)  Goal status: INITIAL  3.  Patient will demo improved FM coordination as evidenced by completing nine-hole peg with use of R in 35 seconds or less. Baseline: Right: 47 secs Goal status: INITIAL  4.  Patient will demonstrate at least 88 lbs R grip strength as needed to open jars and other containers. Baseline: Right: 81.5 lbs Goal status: INITIAL  ASSESSMENT:  CLINICAL IMPRESSION: Pt verbalized understanding of strategies though will likely require repeat education to encourage carryover. Will benefit from table top scanning activities.   PERFORMANCE DEFICITS: in functional skills including ADLs, IADLs, coordination, proprioception, sensation, strength, Fine motor control, decreased knowledge of precautions, decreased knowledge of use of DME, vision, and UE functional use, cognitive skills including attention, memory, and problem solving, and psychosocial skills including coping strategies.    IMPAIRMENTS: are limiting patient from ADLs, IADLs, rest and sleep, leisure, and social participation.   CO-MORBIDITIES: may have co-morbidities  that affects occupational performance. Patient will benefit from skilled OT to address above impairments and improve overall function.  REHAB POTENTIAL: Fair given comorbidities  PLAN:  OT FREQUENCY: 2x/week  OT DURATION: 8 weeks  PLANNED INTERVENTIONS: 97168 OT Re-evaluation, 97535 self care/ADL training, 29528 therapeutic exercise, 97530 therapeutic activity, 97112 neuromuscular re-education, 97140 manual therapy, visual/perceptual remediation/compensation, coping strategies training, patient/family education, and DME and/or AE instructions  RECOMMENDED OTHER SERVICES: N/A for this visit  CONSULTED AND AGREED WITH PLAN OF CARE: Patient and family member/caregiver  PLAN FOR NEXT SESSION: [employer]; tremor reduction strategies - review PRN, FM coordination HEP; R theraband HEP; review vision compensation, scanning activities; initiate diplopia HEP  Delana Meyer, OT 03/11/2024, 3:43 PM

## 2024-03-14 ENCOUNTER — Ambulatory Visit: Admitting: Physical Therapy

## 2024-03-14 ENCOUNTER — Ambulatory Visit: Admitting: Occupational Therapy

## 2024-03-14 VITALS — BP 161/73 | HR 65

## 2024-03-14 DIAGNOSIS — I639 Cerebral infarction, unspecified: Secondary | ICD-10-CM

## 2024-03-14 DIAGNOSIS — M6281 Muscle weakness (generalized): Secondary | ICD-10-CM

## 2024-03-14 DIAGNOSIS — R2689 Other abnormalities of gait and mobility: Secondary | ICD-10-CM

## 2024-03-14 DIAGNOSIS — R2681 Unsteadiness on feet: Secondary | ICD-10-CM

## 2024-03-14 DIAGNOSIS — Z789 Other specified health status: Secondary | ICD-10-CM | POA: Diagnosis not present

## 2024-03-14 DIAGNOSIS — R278 Other lack of coordination: Secondary | ICD-10-CM | POA: Diagnosis not present

## 2024-03-14 DIAGNOSIS — R41841 Cognitive communication deficit: Secondary | ICD-10-CM

## 2024-03-14 DIAGNOSIS — Z89511 Acquired absence of right leg below knee: Secondary | ICD-10-CM | POA: Diagnosis not present

## 2024-03-14 DIAGNOSIS — Z89512 Acquired absence of left leg below knee: Secondary | ICD-10-CM | POA: Diagnosis not present

## 2024-03-14 DIAGNOSIS — R4701 Aphasia: Secondary | ICD-10-CM | POA: Diagnosis not present

## 2024-03-14 DIAGNOSIS — Z9181 History of falling: Secondary | ICD-10-CM | POA: Diagnosis not present

## 2024-03-14 DIAGNOSIS — Z7409 Other reduced mobility: Secondary | ICD-10-CM | POA: Diagnosis not present

## 2024-03-14 DIAGNOSIS — R29818 Other symptoms and signs involving the nervous system: Secondary | ICD-10-CM | POA: Diagnosis not present

## 2024-03-14 NOTE — Therapy (Signed)
 OUTPATIENT OCCUPATIONAL THERAPY NEURO Treatment  Patient Name: [name] MRN: [id number] DOB:Apr 30, 1976, 48 y.o., male Today's Date: 03/14/2024  PCP: Sanda Linger, MD REFERRING PROVIDER: [name], MD  END OF SESSION:  OT End of Session - 03/14/24 1704     Visit Number 4    Number of Visits 17    Date for OT Re-Evaluation 04/26/24    Authorization Type UHC - Medicare    OT Start Time 1402    OT Stop Time 1442    OT Time Calculation (min) 40 min    Activity Tolerance Patient tolerated treatment well    Behavior During Therapy WFL for tasks assessed/performed               Past Medical History:  Diagnosis Date   AMPUTATION, BELOW KNEE, 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   Diabetes mellitus without complication [facility])    no since pancreas transplant   Dialysis patient [facility]) 04/18/2012   "[facility]; [place], [place], [place]"   Gastroparesis    Gastropathy    GERD (gastroesophageal reflux disease)    Hypertension    MRSA infection    over 10 years ago per patient. in legs   Past Surgical History:  Procedure Laterality Date   AV FISTULA PLACEMENT  08/2011   left upper arm   BELOW KNEE LEG AMPUTATION  "it's been awhile"   bilaterally   CATARACT EXTRACTION  ~ 2011   right   COMBINED KIDNEY-PANCREAS TRANSPLANT  2014   ESOPHAGOGASTRODUODENOSCOPY N/A 12/30/2016   Procedure: ESOPHAGOGASTRODUODENOSCOPY (EGD);  Surgeon: Jeani Hawking, MD;  Location: [facility] ENDOSCOPY;  Service: Endoscopy;  Laterality: N/A;   OLECRANON BURSECTOMY Right 06/23/2020   Procedure: RIGHT ELBOW EXCISION OLECRANON BURSITIS;  Surgeon: Nadara Mustard, MD;  Location: [facility];  Service: Orthopedics;  Laterality: Right;   Patient Active Problem List   Diagnosis Date Noted   Mass of right side of neck  02/20/2024   Ischemic cerebrovascular accident (CVA) due to global hypoperfusion with watershed infarction [name]) 09/20/2023   Hypertension secondary to other renal disorders 09/18/2023   PAD (peripheral artery disease) (HCC) 09/04/2023   Diabetes mellitus type II, controlled (HCC) 09/04/2023   Abnormal electrocardiogram (ECG) (EKG) 07/20/2023   Chronic pain 07/04/2023   Tobacco dependence 07/04/2023   Marijuana dependence (HCC) 07/04/2023   Screen for colon cancer 01/10/2022   Balanitis 01/10/2022   Encounter for general adult medical examination with abnormal findings 01/10/2022   Screening for prostate cancer 01/10/2022   Healthcare maintenance 08/26/2016   Impaired mobility and activities of daily living 07/03/2014   Renal transplant, status post 07/19/2013   History of pancreas transplant (HCC) 07/19/2013   Immunosuppressed status (HCC) 06/27/2013   H/O pancreas transplant (HCC) 06/27/2013   Diabetic gastroparesis associated with type 1 diabetes mellitus (HCC) 05/08/2013   S/P bilateral BKA (below knee amputation) (HCC) 08/17/2012   Metabolic bone disease 07/20/2011   ESRD (end stage renal disease) (HCC) 11/17/2009   GERD 03/14/2007   Dyslipidemia, goal LDL below 100 02/22/2007   Major depressive disorder, recurrent episode (HCC) 02/22/2007   POST TRAUMATIC STRESS DISORDER 02/22/2007   IMPOTENCE, 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    ONSET DATE: 02/10/2024 (Date of referral)  REFERRING DIAG: Z89.512,Z89.511 (ICD-10-CM) -  S/P bilateral BKA (below knee amputation) (HCC) Z74.09,Z78.9 (ICD-10-CM) - Impaired mobility and activities of daily living I63.9 (ICD-10-CM) - Ischemic cerebrovascular accident (CVA) due to global hypoperfusion with watershed infarction  THERAPY DIAG:  Muscle weakness (generalized)  Acute CVA (cerebrovascular accident) (HCC)  Cognitive communication deficit  Ischemic cerebrovascular accident (CVA) due to global hypoperfusion with watershed infarction [facility])  Rationale  for Evaluation and Treatment: Rehabilitation  SUBJECTIVE:   SUBJECTIVE STATEMENT: From eval: 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   Pt reported feeling sore, woke up sore today. Pt reported walking around backyard yesterday "I think I overdid it." Pt reported walking around full yard yesterday which was first time pt attempted to walk entire yard. Pt reported walking up steps as well.  Pt accompanied by: self  PERTINENT HISTORY:  PAD, HTN, Strokes, s/p Kidney and Pancreas Transplant, Carotid Stenosis, T1DM, ESRD, bilateral BKA amputee   PRECAUTIONS: None  WEIGHT BEARING RESTRICTIONS: No  PAIN:  Are you having pain? Yes: NPRS scale: 9/10 Pain location: BLE and back Pain description: soreness Aggravating factors: depends Relieving factors: depends  FALLS: Has patient fallen in last 6 months? Yes. Number of falls 1  LIVING ENVIRONMENT: Lives with: lives with their spouse Lives in: House/apartment Stairs: Yes: Internal: 15 steps; on right going up and External: 5 steps; bilateral but cannot reach both Has following equipment at home: Quad cane small base, Walker - 2 wheeled, Wheelchair (manual), and shower chair  PLOF: Independent; was driving;   PATIENT GOALS: get back to PLOF  OBJECTIVE:  Note: Objective measures were completed at Evaluation unless otherwise noted.  HAND DOMINANCE: Right  ADLs: Overall ADLs: mod I   IADLs: Light housekeeping: sweeping, gathers up laundry, dishes  Meal Prep: mod I Community mobility: dependent Medication management: setup Handwriting: 50% legible, Increased time, and due to cognitive impairments  MOBILITY STATUS: Independent - manual wheelchair  ACTIVITY TOLERANCE: Activity tolerance: fair  FUNCTIONAL OUTCOME MEASURES:  03/04/24 - PSFS - 3.3   Total score = sum of the activity scores/number of activities Minimum detectable change (90%CI) for average score = 2 points Minimum detectable change (90%CI) for single activity score = 3  points   UPPER EXTREMITY ROM:  Impaired R supination ~ neutral though able to improve to [facility] with increased time and awareness.     BUE - WNL though weaker on R vs L  HAND FUNCTION: Grip strength: Right: 81.5 lbs; Left: 97.8 lbs  COORDINATION: 9 Hole Peg test: Right: 47 sec; Left: 33 sec - mild tremor in R hand  SENSATION: WFL  EDEMA: none reported or observed  MUSCLE TONE:WFL  COGNITION: Overall cognitive status: Impaired - see ST for further details  VISION: Subjective report: Pt reports vision fluctuates including episodes of double vision and L visual field cut Baseline vision: Bifocals, Wears glasses all the time, and ST bifocals Visual history: cataracts and corrective eye surgery  VISION ASSESSMENT: WFL distance and near acuity; ~45* L visual field  PERCEPTION: Impaired: secondary to visual impairments; pt required cues to locate hand sanitizer on table, reaching to Kleenex originally  PRAXIS: Impaired: Motor planning and potential ideational praxis  OBSERVATIONS: Pt appears well-kept. Glasses hanging on shirt. Pt able to self-propel manual wheelchair.  TREATMENT :  Self-Care OT educated pt on gradual increase of activity to avoid soreness, importance of recovery time, heat modality to decrease soreness. Pt verbalized understanding of all.  TherAct [employer] - to improve visual scanning, sequencing, processing for novel tasks, for FM coordination of BUE, for bilateral integration of BUE. Pt benefited from mod v/c to identify strategies then fading v/c as task progressed.  HEP update: OT initiated vision HEP, see pt instructions. - to improve eye muscle strengthening, to improve visual scanning, to improve sequencing for novel tasks.   PATIENT EDUCATION: Education details: see today's tx above Person educated:  Patient Education method: Explanation, Demonstration, Verbal cues, and Handouts Education comprehension: verbalized understanding, returned demonstration, verbal cues required, and needs further education  HOME EXERCISE PROGRAM: 03/04/24 - tremor compensation strategies 03/11/2024: vision strategies and visual scanning 03/14/24 - vision HEP (see pt instructions)  GOALS:  SHORT TERM GOALS: Target date: 03/27/2024   Patient will demonstrate initial RUE HEP with 25% verbal cues or less for proper execution. Goal status: INITIAL  2.  Patient will independently recall at least 2 compensatory strategies for visual impairment without cueing.  Goal status: INITIAL  3.  Patient will demonstrate at least 85% accuracy with environmental visual scanning to assist with ADLs and IADLS including potential driving considerations.  Goal status: INITIAL  4.  Pt will independently recall at least 2 tremor reduction strategies.   Baseline:  03/04/24 - OT educated pt on tremor compensation strategies. Pt returned demo. Goal status: in progress  LONG TERM GOALS: Target date: 04/26/2024  Patient will demonstrate updated RUE HEP with visual handouts only for proper execution.  Goal status: INITIAL  2.  03/04/24 revised - Patient will report at least two-point increase in average PSFS score or at least three-point increase in a single activity score indicating functionally significant improvement given minimum detectable change. Baseline: PSFS - 3.3 total score (See above for individual activity scores)  Goal status: INITIAL  3.  Patient will demo improved FM coordination as evidenced by completing nine-hole peg with use of R in 35 seconds or less. Baseline: Right: 47 secs Goal status: INITIAL  4.  Patient will demonstrate at least 88 lbs R grip strength as needed to open jars and other containers. Baseline: Right: 81.5 lbs Goal status: INITIAL  ASSESSMENT:  CLINICAL IMPRESSION: Pt tolerated tasks  well. Pt verbalized understanding of education today though will likely require repeat education to encourage carryover. Will benefit from table top scanning activities and incorporation of cognitive components of tasks.   PERFORMANCE DEFICITS: in functional skills including ADLs, IADLs, coordination, proprioception, sensation, strength, Fine motor control, decreased knowledge of precautions, decreased knowledge of use of DME, vision, and UE functional use, cognitive skills including attention, memory, and problem solving, and psychosocial skills including coping strategies.   IMPAIRMENTS: are limiting patient from ADLs, IADLs, rest and sleep, leisure, and social participation.   CO-MORBIDITIES: may have co-morbidities  that affects occupational performance. Patient will benefit from skilled OT to address above impairments and improve overall function.  REHAB POTENTIAL: Fair given comorbidities  PLAN:  OT FREQUENCY: 2x/week  OT DURATION: 8 weeks  PLANNED INTERVENTIONS: 97168 OT Re-evaluation, 97535 self care/ADL training, 04540 therapeutic exercise, 97530 therapeutic activity, 97112 neuromuscular re-education, 97140 manual therapy, visual/perceptual remediation/compensation, coping strategies training, patient/family education, and DME and/or AE instructions  RECOMMENDED OTHER SERVICES: N/A for this visit  CONSULTED AND AGREED WITH PLAN OF CARE: Patient and family member/caregiver  PLAN FOR NEXT SESSION:  Review PRN - tremor reduction strategies, vision compensation and scanning activities, diplopia HEP  FM coordination HEP and/or activities ?R theraband HEP  Wynetta Emery, OT 03/14/2024, 5:10 PM

## 2024-03-14 NOTE — Therapy (Signed)
 OUTPATIENT PHYSICAL THERAPY NEURO TREATMENT   Patient Name: [name] MRN: [id number] DOB:1976/06/07, 48 y.o., male Today's Date: 03/14/2024   PCP: [name], MD REFERRING PROVIDER: [name], MD  END OF SESSION:  PT End of Session - 03/14/24 1447     Visit Number 4    Number of Visits 17    Date for PT Re-Evaluation 05/01/24    Authorization Type UHC Dual complete    PT Start Time 1446    PT Stop Time 1526    PT Time Calculation (min) 40 min    Equipment Utilized During Treatment Gait belt    Activity Tolerance Patient limited by pain    Behavior During Therapy WFL for tasks assessed/performed              Past Medical History:  Diagnosis Date   AMPUTATION, BELOW KNEE, 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   Diabetes mellitus without complication [facility])    no since pancreas transplant   Dialysis patient [facility]) 04/18/2012   "[facility]; [place], [place], [place]"   Gastroparesis    Gastropathy    GERD (gastroesophageal reflux disease)    Hypertension    MRSA infection    over 10 years ago per patient. in legs   Past Surgical History:  Procedure Laterality Date   AV FISTULA PLACEMENT  08/2011   left upper arm   BELOW KNEE LEG AMPUTATION  "it's been awhile"   bilaterally   CATARACT EXTRACTION  ~ 2011   right   COMBINED KIDNEY-PANCREAS TRANSPLANT  2014   ESOPHAGOGASTRODUODENOSCOPY N/A 12/30/2016   Procedure: ESOPHAGOGASTRODUODENOSCOPY (EGD);  Surgeon: Jeani Hawking, MD;  Location: [facility] ENDOSCOPY;  Service: Endoscopy;  Laterality: N/A;   OLECRANON BURSECTOMY Right 06/23/2020   Procedure: RIGHT ELBOW EXCISION OLECRANON BURSITIS;  Surgeon: Nadara Mustard, MD;  Location: [facility];  Service: Orthopedics;  Laterality: Right;   Patient Active Problem List   Diagnosis  Date Noted   Mass of right side of neck 02/20/2024   Ischemic cerebrovascular accident (CVA) due to global hypoperfusion with watershed infarction [facility]) 09/20/2023   Hypertension secondary to other renal disorders 09/18/2023   PAD (peripheral artery disease) (HCC) 09/04/2023   Diabetes mellitus type II, controlled (HCC) 09/04/2023   Abnormal electrocardiogram (ECG) (EKG) 07/20/2023   Chronic pain 07/04/2023   Tobacco dependence 07/04/2023   Marijuana dependence (HCC) 07/04/2023   Screen for colon cancer 01/10/2022   Balanitis 01/10/2022   Encounter for general adult medical examination with abnormal findings 01/10/2022   Screening for prostate cancer 01/10/2022   Healthcare maintenance 08/26/2016   Impaired mobility and activities of daily living 07/03/2014   Renal transplant, status post 07/19/2013   History of pancreas transplant (HCC) 07/19/2013   Immunosuppressed status (HCC) 06/27/2013   H/O pancreas transplant (HCC) 06/27/2013   Diabetic gastroparesis associated with type 1 diabetes mellitus (HCC) 05/08/2013   S/P bilateral BKA (below knee amputation) (HCC) 08/17/2012   Metabolic bone disease 07/20/2011   ESRD (end stage renal disease) (HCC) 11/17/2009   GERD 03/14/2007   Dyslipidemia, goal LDL below 100 02/22/2007   Major depressive disorder, recurrent episode (HCC) 02/22/2007   POST TRAUMATIC STRESS DISORDER 02/22/2007   IMPOTENCE, 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  ONSET DATE: 02/10/2024 (referral)   REFERRING DIAG: [id number] (ICD-10-CM) - S/P bilateral BKA (below knee amputation) (HCC) Z74.09,Z78.9 (ICD-10-CM) - Impaired mobility and activities of daily living I63.9 (ICD-10-CM) - Ischemic cerebrovascular accident (CVA) due to global hypoperfusion with watershed infarction [facility])  THERAPY DIAG:  Muscle weakness (generalized)  Unsteadiness on feet  Other abnormalities of gait and mobility  Rationale for Evaluation and Treatment: Rehabilitation  SUBJECTIVE:                                                                                                                                                                                              SUBJECTIVE STATEMENT: Patient reports being very sore today. Walked across his backyard yesterday and was a long distance. Rating his soreness as a 9/10. No falls   Pt accompanied by: self  PERTINENT HISTORY: PAD, HTN, Strokes, s/p Kidney and Pancreas Transplant, Carotid Stenosis, T1DM, ESRD, bilateral BKA amputee   PAIN: "I am always in pain" Are you having pain? Yes: NPRS scale: 9/10 Pain location: Thighs  Pain description: soreness   PRECAUTIONS: Fall  PATIENT GOALS: " To get my walk as close as I can to where I was"   OBJECTIVE:  Note: Objective measures were completed at Evaluation unless otherwise noted.  DIAGNOSTIC FINDINGS: CT of head from 10/11/23  IMPRESSION: Evolving left MCA territory infarcts involving the left frontal and parietal lobes, better seen on recent prior brain MRI. Redemonstrated petechial hemorrhage along the posterior left frontal lobe.  MRI of head from 09/18/23 IMPRESSION: 1. Scattered small foci of acute subcortical infarction in the left occipital lobe and posterior left frontal lobe. 2. Faint diffusion signal abnormality along the left parietal lobe may reflect evolving infarct versus seizure related cytotoxic edema. 3. 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  VITALS  Vitals:   03/14/24 1449  BP: (!) 161/73  Pulse: 65                                                                                                                            TREATMENT:  Self-care/home management  Assessed vitals (see above) and systolic elevated, but pt reports this is his normal BP.   Ther Act  SciFit multi-peaks level 6.5 for 8 minutes using BUE/BLEs for neural priming for reciprocal movement, dynamic cardiovascular warmup and increased amplitude of  stepping. Pt required several rest breaks during activity due to fatigue. RPE of 6-7/10 following activity.  Supine LTR, x10 per side. Decreased rotation to L side  Supine glute bridges x10  Single leg glute bridges, x10 per side    PATIENT EDUCATION: Education details: cont HEP, use gait belt + wife if walking around house without AD Person educated: Patient and Spouse Education method: Medical illustrator Education comprehension: verbalized understanding and needs further education  HOME EXERCISE PROGRAM: Access Code: [email or web address] URL: [email or web address] Date: 03/04/2024 Prepared by: Merry Lofty  Exercises - Standing Hip Abduction with Counter Support  - 1 x daily - 7 x weekly - 3 sets - 10 reps - Standing March with Counter Support  - 1 x daily - 7 x weekly - 3 sets - 10 reps - Standing Hip Extension with Counter Support  - 1 x daily - 7 x weekly - 3 sets - 10 reps - Mini Squat with Counter Support  - 1 x daily - 7 x weekly - 3 sets - 10 reps - Sit to Stand with Counter Support  - 1 x daily - 7 x weekly - 3 sets - 5 reps -seated march over  - Lower Trunk Rotation Stretch  - 1 x daily - 7 x weekly - 3 sets - 10 reps - Single Leg Bridge  - 1 x daily - 7 x weekly - 1-2 sets - 6-10 reps  GOALS: Goals reviewed with patient? Yes  SHORT TERM GOALS: Target date: 03/27/2024   Pt will perform floor transfer w/min A for improved fall recovery and safety at home  Baseline: Goal status: INITIAL  2.  Pt will improve 5 x STS to less than or equal to 21 seconds w/SBA to demonstrate improved functional strength and transfer efficiency.   Baseline: 26.5s  Goal status: INITIAL  3.  Pt will improve gait velocity to at least 1.2 ft/s w/LRAD and CGA for improved gait efficiency and independence  Baseline: 0.8 ft/s w/SBQC and CGA Goal status: INITIAL  4.  Pt will ascend/descend 12 steps w/single rail and CGA for improved safety in home environment  Baseline: Min A  w/bilateral rails  Goal status: INITIAL   LONG TERM GOALS: Target date: 04/24/2024   Pt will perform floor transfer w/CGA for fall recovery and safety  Baseline:  Goal status: INITIAL  2.  Pt will improve 5 x STS to less than or equal to 18 seconds mod I to demonstrate improved functional strength and transfer efficiency.  Baseline: 26.5s  Goal status: INITIAL  3.  Pt will improve gait velocity to at least 1.5 ft/s w/LRAD and SBA for improved gait efficiency and reduced fall risk   Baseline: 0.8 ft/s w/SBQC and CGA Goal status: INITIAL  4.  Pt will navigate 12 stairs w/single rail and SBA for improved safety in home environment.  Baseline: min A w/bilateral rails  Goal status: INITIAL   ASSESSMENT:  CLINICAL IMPRESSION: Patient seen for skilled PT session with emphasis on progressing gait training with and without AD. He demonstrates reasonable carryover from previous sessions focused on equal step length and increasing R foot clearance. With fatigue, patient relies more on L lateral weight shift and R circumduction. Therapeutic  tasks targeted hamstring activation in order to improve foot clearance. Patient with minimal AROM in standing, but able to achieve iso contraction in sitting. Continue POC.    OBJECTIVE IMPAIRMENTS: Abnormal gait, decreased activity tolerance, decreased balance, decreased cognition, decreased endurance, decreased knowledge of condition, decreased knowledge of use of DME, difficulty walking, decreased strength, decreased safety awareness, impaired perceived functional ability, impaired sensation, improper body mechanics, prosthetic dependency , and pain  ACTIVITY LIMITATIONS: carrying, lifting, bending, standing, squatting, stairs, transfers, bathing, dressing, hygiene/grooming, locomotion level, and caring for others  PARTICIPATION LIMITATIONS: meal prep, cleaning, laundry, medication management, interpersonal relationship, driving, shopping, community  activity, and yard work  PERSONAL FACTORS: Fitness, Past/current experiences, and 1-2 comorbidities: bilateral BKA, CVA  are also affecting patient's functional outcome.   REHAB POTENTIAL: Good  CLINICAL DECISION MAKING: Evolving/moderate complexity  EVALUATION COMPLEXITY: Moderate  PLAN:  PT FREQUENCY: 2x/week  PT DURATION: 8 weeks  PLANNED INTERVENTIONS: 97164- PT Re-evaluation, 97110-Therapeutic exercises, 97530- Therapeutic activity, [id number]- Neuromuscular re-education, 97535- Self Care, 86578- Manual therapy, [id number]- Gait training, [phone]- Prosthetic training, [id number]- Electrical stimulation (manual), Patient/Family education, Balance training, Stair training, Dry Needling, and DME instructions  PLAN FOR NEXT SESSION: Monitor vitals. Stairs, floor transfers, hip strengthening, hamstring strengthening, progressing gait with SBQC vs no AD   Jill Alexanders Falisa Lamora, PT, DPT  03/14/2024, 3:26 PM

## 2024-03-14 NOTE — Patient Instructions (Addendum)
 Vision Convergence HEP   1.  Uncover both eyes and try to focus on the object while holding it in the middle.  Try to make it 1 image.   2.  If you can, try to hold it for 10-30 sec increasing as able.    3.  Once you can make the image 1 for at least 30 sec in the middle, repeat #1-6 above with both eyes moving slowly and only in the range that you can keep the image 1.     Vision HEP  Perform at least 2-3 times per day.     Stop if your eye becomes fatigued or hurts and try again later.    Sit up nice and tall to help build core strength.  Hold a small brightly colored object/card in front of you.  Hold it in the middle at arm's length away.    Slowly move the object side to side in front of you while watching it with both eyes.  Then, move object up and down while watching the object.  Remember to keep your head still and only move your eye.  Repeat 3-4 times with each hand.

## 2024-03-18 ENCOUNTER — Ambulatory Visit: Admitting: Physical Therapy

## 2024-03-18 ENCOUNTER — Ambulatory Visit: Admitting: Occupational Therapy

## 2024-03-18 VITALS — BP 151/70 | HR 67

## 2024-03-18 DIAGNOSIS — Z89512 Acquired absence of left leg below knee: Secondary | ICD-10-CM | POA: Diagnosis not present

## 2024-03-18 DIAGNOSIS — R4701 Aphasia: Secondary | ICD-10-CM | POA: Diagnosis not present

## 2024-03-18 DIAGNOSIS — R278 Other lack of coordination: Secondary | ICD-10-CM

## 2024-03-18 DIAGNOSIS — Z7409 Other reduced mobility: Secondary | ICD-10-CM | POA: Diagnosis not present

## 2024-03-18 DIAGNOSIS — R29818 Other symptoms and signs involving the nervous system: Secondary | ICD-10-CM

## 2024-03-18 DIAGNOSIS — M6281 Muscle weakness (generalized): Secondary | ICD-10-CM

## 2024-03-18 DIAGNOSIS — I639 Cerebral infarction, unspecified: Secondary | ICD-10-CM

## 2024-03-18 DIAGNOSIS — R2681 Unsteadiness on feet: Secondary | ICD-10-CM | POA: Diagnosis not present

## 2024-03-18 DIAGNOSIS — Z9181 History of falling: Secondary | ICD-10-CM | POA: Diagnosis not present

## 2024-03-18 DIAGNOSIS — Z89511 Acquired absence of right leg below knee: Secondary | ICD-10-CM | POA: Diagnosis not present

## 2024-03-18 DIAGNOSIS — Z789 Other specified health status: Secondary | ICD-10-CM | POA: Diagnosis not present

## 2024-03-18 DIAGNOSIS — R2689 Other abnormalities of gait and mobility: Secondary | ICD-10-CM

## 2024-03-18 NOTE — Therapy (Unsigned)
 OUTPATIENT OCCUPATIONAL THERAPY NEURO Treatment  Patient Name: [name] MRN: [id number] DOB:05-04-1976, 48 y.o., male Today's Date: 03/18/2024  PCP: Sanda Linger, MD REFERRING PROVIDER: [name], MD  END OF SESSION:  OT End of Session - 03/18/24 1451     Visit Number 5    Number of Visits 17    Date for OT Re-Evaluation 04/26/24    Authorization Type UHC - Medicare    OT Start Time 1450    OT Stop Time 1530    OT Time Calculation (min) 40 min    Equipment Utilized During Treatment FM objects    Activity Tolerance Patient tolerated treatment well    Behavior During Therapy WFL for tasks assessed/performed               Past Medical History:  Diagnosis Date   AMPUTATION, BELOW KNEE, 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   Diabetes mellitus without complication [facility])    no since pancreas transplant   Dialysis patient [facility]) 04/18/2012   "[facility]; [place], Cataract, Sat"   Gastroparesis    Gastropathy    GERD (gastroesophageal reflux disease)    Hypertension    MRSA infection    over 10 years ago per patient. in legs   Past Surgical History:  Procedure Laterality Date   AV FISTULA PLACEMENT  08/2011   left upper arm   BELOW KNEE LEG AMPUTATION  "it's been awhile"   bilaterally   CATARACT EXTRACTION  ~ 2011   right   COMBINED KIDNEY-PANCREAS TRANSPLANT  2014   ESOPHAGOGASTRODUODENOSCOPY N/A 12/30/2016   Procedure: ESOPHAGOGASTRODUODENOSCOPY (EGD);  Surgeon: Jeani Hawking, MD;  Location: [facility] ENDOSCOPY;  Service: Endoscopy;  Laterality: N/A;   OLECRANON BURSECTOMY Right 06/23/2020   Procedure: RIGHT ELBOW EXCISION OLECRANON BURSITIS;  Surgeon: Nadara Mustard, MD;  Location: [facility];  Service: Orthopedics;  Laterality: Right;   Patient Active Problem List    Diagnosis Date Noted   Mass of right side of neck 02/20/2024   Ischemic cerebrovascular accident (CVA) due to global hypoperfusion with watershed infarction [facility]) 09/20/2023   Hypertension secondary to other renal disorders 09/18/2023   PAD (peripheral artery disease) (HCC) 09/04/2023   Diabetes mellitus type II, controlled (HCC) 09/04/2023   Abnormal electrocardiogram (ECG) (EKG) 07/20/2023   Chronic pain 07/04/2023   Tobacco dependence 07/04/2023   Marijuana dependence (HCC) 07/04/2023   Screen for colon cancer 01/10/2022   Balanitis 01/10/2022   Encounter for general adult medical examination with abnormal findings 01/10/2022   Screening for prostate cancer 01/10/2022   Healthcare maintenance 08/26/2016   Impaired mobility and activities of daily living 07/03/2014   Renal transplant, status post 07/19/2013   History of pancreas transplant (HCC) 07/19/2013   Immunosuppressed status (HCC) 06/27/2013   H/O pancreas transplant (HCC) 06/27/2013   Diabetic gastroparesis associated with type 1 diabetes mellitus (HCC) 05/08/2013   S/P bilateral BKA (below knee amputation) (HCC) 08/17/2012   Metabolic bone disease 07/20/2011   ESRD (end stage renal disease) (HCC) 11/17/2009   GERD 03/14/2007   Dyslipidemia, goal LDL below 100 02/22/2007   Major depressive disorder, recurrent episode (HCC) 02/22/2007   POST TRAUMATIC STRESS DISORDER 02/22/2007   IMPOTENCE, 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    ONSET DATE:  02/10/2024 (Date of referral)  REFERRING DIAG: Z89.512,Z89.511 (ICD-10-CM) - S/P bilateral BKA (below knee amputation) (HCC) Z74.09,Z78.9 (ICD-10-CM) - Impaired mobility and activities of daily living I63.9 (ICD-10-CM) - Ischemic cerebrovascular accident (CVA) due to global hypoperfusion with watershed infarction  THERAPY DIAG:  Other lack of coordination  Muscle weakness (generalized)  Other symptoms and signs involving the nervous system  Acute CVA (cerebrovascular accident) (HCC)  Rationale  for Evaluation and Treatment: Rehabilitation  SUBJECTIVE:   SUBJECTIVE STATEMENT: From eval: 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   Pt reported he did some walking inside this weekend but didn't overdo it.  Pt also reports that he needs new glasses and has an appt in May.  Pt accompanied by: self  PERTINENT HISTORY:  PAD, HTN, Strokes, s/p Kidney and Pancreas Transplant, Carotid Stenosis, T1DM, ESRD, bilateral BKA amputee   PRECAUTIONS: None  WEIGHT BEARING RESTRICTIONS: No  PAIN:  Are you having pain? Yes: NPRS scale: 7/10 Pain location: all over - had an incident of over exertion yesterday Pain description: soreness Aggravating factors: depends- doing stuff (over exerted self but no choice) Relieving factors: depends - pain meds, rest/laying down  FALLS: Has patient fallen in last 6 months? Yes. Number of falls 1  LIVING ENVIRONMENT: Lives with: lives with their spouse Lives in: House/apartment Stairs: Yes: Internal: 15 steps; on right going up and External: 5 steps; bilateral but cannot reach both Has following equipment at home: Quad cane small base, Walker - 2 wheeled, Wheelchair (manual), and shower chair  PLOF: Independent; was driving;   PATIENT GOALS: get back to PLOF  OBJECTIVE:  Note: Objective measures were completed at Evaluation unless otherwise noted.  HAND DOMINANCE: Right  ADLs: Overall ADLs: mod I   IADLs: Light housekeeping: sweeping, gathers up laundry, dishes  Meal Prep: mod I Community mobility: dependent Medication management: setup Handwriting: 50% legible, Increased time, and due to cognitive impairments  MOBILITY STATUS: Independent - manual wheelchair  ACTIVITY TOLERANCE: Activity tolerance: fair  FUNCTIONAL OUTCOME MEASURES:  03/04/24 - PSFS - 3.3   Total score = sum of the activity scores/number of activities Minimum detectable change (90%CI) for average score = 2 points Minimum detectable change (90%CI) for single activity score = 3  points   UPPER EXTREMITY ROM:  Impaired R supination ~ neutral though able to improve to [facility] with increased time and awareness.     BUE - WNL though weaker on R vs L  HAND FUNCTION: Grip strength: Right: 81.5 lbs; Left: 97.8 lbs  COORDINATION: 9 Hole Peg test: Right: 47 sec; Left: 33 sec - mild tremor in R hand  SENSATION: WFL  EDEMA: none reported or observed  MUSCLE TONE:WFL  COGNITION: Overall cognitive status: Impaired - see ST for further details  VISION: Subjective report: Pt reports vision fluctuates including episodes of double vision and L visual field cut Baseline vision: Bifocals, Wears glasses all the time, and ST bifocals Visual history: cataracts and corrective eye surgery  VISION ASSESSMENT: WFL distance and near acuity; ~45* L visual field  PERCEPTION: Impaired: secondary to visual impairments; pt required cues to locate hand sanitizer on table, reaching to Kleenex originally  PRAXIS: Impaired: Motor planning and potential ideational praxis  OBSERVATIONS: Pt appears well-kept. Glasses hanging on shirt. Pt able to self-propel manual wheelchair.  TREATMENT :  TherAct  Pt engaged in writing activities today with trial of different pencil grips but most comfort with a regular pen reported by pt.  Pt worked hard to slow down to write his name in cursive and reported that is was the best he has done in awhile.    He was then engaged in printing with good success with copying with 100% legibility but at a slow pace.   Timoteo Gaul to work on picking up individual small objects/dice 1 at a time until pt had 5 in palm to then roll.  Pt had to pick up dice again to try and get up to 5 of a kind for each number over 3 rolls.   [employer] - to improve visual scanning, sequencing, processing for novel tasks, for FM  coordination of BUE, for bilateral integration of BUE. Pt benefited from mod v/c to identify strategies then fading v/c as task progressed.  HEP update: OT initiated vision HEP, see pt instructions. - to improve eye muscle strengthening, to improve visual scanning, to improve sequencing for novel tasks.   PATIENT EDUCATION: Education details: FM activities  Person educated: Patient Education method: Solicitor, and Verbal cues Education comprehension: verbalized understanding, returned demonstration, verbal cues required, and needs further education  HOME EXERCISE PROGRAM: 03/04/24 - tremor compensation strategies 03/11/2024: vision strategies and visual scanning 03/14/24 - vision HEP (see pt instructions)  GOALS:  SHORT TERM GOALS: Target date: 03/27/2024   Patient will demonstrate initial RUE HEP with 25% verbal cues or less for proper execution. Goal status: IN Progress  2.  Patient will independently recall at least 2 compensatory strategies for visual impairment without cueing.  Goal status: IN Progress  3.  Patient will demonstrate at least 85% accuracy with environmental visual scanning to assist with ADLs and IADLS including potential driving considerations.  Goal status: IN Progress  4.  Pt will independently recall at least 2 tremor reduction strategies.   Baseline:  03/04/24 - OT educated pt on tremor compensation strategies. Pt returned demo. Goal status: in progress  LONG TERM GOALS: Target date: 04/26/2024  Patient will demonstrate updated RUE HEP with visual handouts only for proper execution.  Goal status: IN Progress  2.  03/04/24 revised - Patient will report at least two-point increase in average PSFS score or at least three-point increase in a single activity score indicating functionally significant improvement given minimum detectable change. Baseline: PSFS - 3.3 total score (See above for individual activity scores)  Goal status: INITIAL  3.   Patient will demo improved FM coordination as evidenced by completing nine-hole peg with use of R in 35 seconds or less. Baseline: Right: 47 secs Goal status: IN Progress  4.  Patient will demonstrate at least 88 lbs R grip strength as needed to open jars and other containers. Baseline: Right: 81.5 lbs Goal status: IN Progress  ASSESSMENT:  CLINICAL IMPRESSION: Patient is a 48 y.o. male who was seen today for occupational therapy treatment for UE coordination and visual deficits. Patient reports ongoing participation in progression of HEP ideas. Pt will benefit from ongoing skilled OT services in the outpatient setting to work on impairments as noted below to help pt return to PLOF as able.  PERFORMANCE DEFICITS: in functional skills including ADLs, IADLs, coordination, proprioception, sensation, strength, Fine motor control, decreased knowledge of precautions, decreased knowledge of use of DME, vision, and UE functional use, cognitive skills including attention, memory, and problem solving, and psychosocial skills  including coping strategies.   IMPAIRMENTS: are limiting patient from ADLs, IADLs, rest and sleep, leisure, and social participation.   CO-MORBIDITIES: may have co-morbidities  that affects occupational performance. Patient will benefit from skilled OT to address above impairments and improve overall function.  REHAB POTENTIAL: Fair given comorbidities  PLAN:  OT FREQUENCY: 2x/week  OT DURATION: 8 weeks  PLANNED INTERVENTIONS: 97168 OT Re-evaluation, 97535 self care/ADL training, 16109 therapeutic exercise, 97530 therapeutic activity, 97112 neuromuscular re-education, 97140 manual therapy, visual/perceptual remediation/compensation, coping strategies training, patient/family education, and DME and/or AE instructions  RECOMMENDED OTHER SERVICES: N/A for this visit  CONSULTED AND AGREED WITH PLAN OF CARE: Patient and family member/caregiver  PLAN FOR NEXT SESSION:  Review  PRN - tremor reduction strategies, vision compensation and scanning activities, diplopia HEP  Continued progression of FM coordination HEP and/or activities R theraband HEP  Victorino Sparrow, OT 03/18/2024, 5:32 PM

## 2024-03-18 NOTE — Therapy (Signed)
 OUTPATIENT PHYSICAL THERAPY NEURO TREATMENT   Patient Name: [name] MRN: [id number] DOB:[birth year], 48 y.o., male Today's Date: 03/18/2024   PCP: [name], MD REFERRING PROVIDER: [name], MD  END OF SESSION:  PT End of Session - 03/18/24 1534     Visit Number 5    Number of Visits 17    Date for PT Re-Evaluation 05/01/24    Authorization Type UHC Dual complete    PT Start Time 1533    PT Stop Time 1616    PT Time Calculation (min) 43 min    Equipment Utilized During Treatment Gait belt    Activity Tolerance Patient tolerated treatment well    Behavior During Therapy WFL for tasks assessed/performed               Past Medical History:  Diagnosis Date   AMPUTATION, BELOW KNEE, 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   Diabetes mellitus without complication [facility])    no since pancreas transplant   Dialysis patient [facility]) 04/18/2012   "[facility]; [place], [place], Sat"   Gastroparesis    Gastropathy    GERD (gastroesophageal reflux disease)    Hypertension    MRSA infection    over 10 years ago per patient. in legs   Past Surgical History:  Procedure Laterality Date   AV FISTULA PLACEMENT  08/2011   left upper arm   BELOW KNEE LEG AMPUTATION  "it's been awhile"   bilaterally   CATARACT EXTRACTION  ~ 2011   right   COMBINED KIDNEY-PANCREAS TRANSPLANT  2014   ESOPHAGOGASTRODUODENOSCOPY N/A 12/30/2016   Procedure: ESOPHAGOGASTRODUODENOSCOPY (EGD);  Surgeon: Jeani Hawking, MD;  Location: [facility] ENDOSCOPY;  Service: Endoscopy;  Laterality: N/A;   OLECRANON BURSECTOMY Right 06/23/2020   Procedure: RIGHT ELBOW EXCISION OLECRANON BURSITIS;  Surgeon: Nadara Mustard, MD;  Location: [facility];  Service: Orthopedics;  Laterality: Right;   Patient Active Problem List    Diagnosis Date Noted   Mass of right side of neck 02/20/2024   Ischemic cerebrovascular accident (CVA) due to global hypoperfusion with watershed infarction [facility]) 09/20/2023   Hypertension secondary to other renal disorders 09/18/2023   PAD (peripheral artery disease) (HCC) 09/04/2023   Diabetes mellitus type II, controlled (HCC) 09/04/2023   Abnormal electrocardiogram (ECG) (EKG) 07/20/2023   Chronic pain 07/04/2023   Tobacco dependence 07/04/2023   Marijuana dependence (HCC) 07/04/2023   Screen for colon cancer 01/10/2022   Balanitis 01/10/2022   Encounter for general adult medical examination with abnormal findings 01/10/2022   Screening for prostate cancer 01/10/2022   Healthcare maintenance 08/26/2016   Impaired mobility and activities of daily living 07/03/2014   Renal transplant, status post 07/19/2013   History of pancreas transplant (HCC) 07/19/2013   Immunosuppressed status (HCC) 06/27/2013   H/O pancreas transplant (HCC) 06/27/2013   Diabetic gastroparesis associated with type 1 diabetes mellitus (HCC) 05/08/2013   S/P bilateral BKA (below knee amputation) (HCC) 08/17/2012   Metabolic bone disease 07/20/2011   ESRD (end stage renal disease) (HCC) 11/17/2009   GERD 03/14/2007   Dyslipidemia, goal LDL below 100 02/22/2007   Major depressive disorder, recurrent episode (HCC) 02/22/2007   POST TRAUMATIC STRESS DISORDER 02/22/2007   IMPOTENCE, 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  ONSET DATE: 02/10/2024 (referral)   REFERRING DIAG: [phone] (ICD-10-CM) - S/P bilateral BKA (below knee amputation) (HCC) Z74.09,Z78.9 (ICD-10-CM) - Impaired mobility and activities of daily living I63.9 (ICD-10-CM) - Ischemic cerebrovascular accident (CVA) due to global hypoperfusion with watershed infarction [facility])  THERAPY DIAG:  Muscle weakness (generalized)  Unsteadiness on feet  Other abnormalities of gait and mobility  Rationale for Evaluation and Treatment: Rehabilitation  SUBJECTIVE:                                                                                                                                                                                              SUBJECTIVE STATEMENT: Patient reports being okay. Not as sore today. Had a bad day yesterday that resulted in a lot of pain today. "I don't want to talk about it".   Pt accompanied by: self  PERTINENT HISTORY: PAD, HTN, Strokes, s/p Kidney and Pancreas Transplant, Carotid Stenosis, T1DM, ESRD, bilateral BKA amputee   PAIN: "I am always in pain" Are you having pain? Yes: NPRS scale: 7/10 Pain location: "All over"  Pain description: soreness   PRECAUTIONS: Fall  PATIENT GOALS: " To get my walk as close as I can to where I was"   OBJECTIVE:  Note: Objective measures were completed at Evaluation unless otherwise noted.  DIAGNOSTIC FINDINGS: CT of head from 10/11/23  IMPRESSION: Evolving left MCA territory infarcts involving the left frontal and parietal lobes, better seen on recent prior brain MRI. Redemonstrated petechial hemorrhage along the posterior left frontal lobe.  MRI of head from 09/18/23 IMPRESSION: 1. Scattered small foci of acute subcortical infarction in the left occipital lobe and posterior left frontal lobe. 2. Faint diffusion signal abnormality along the left parietal lobe may reflect evolving infarct versus seizure related cytotoxic edema. 3. 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  VITALS  Vitals:   03/18/24 1537  BP: (!) 151/70  Pulse: 67  TREATMENT:   Self-care/home management  Assessed vitals (see above) and systolic elevated, but pt reports this is his normal BP.   NMR Pt doffed bilateral prostheses and performed tall kneel on mat w/CGA. Bench placed on mat to provide UE support throughout for improved posterior chain  strength, single leg stability and core stability:  Mini squats, x10 reps  Alt UE flexion, x8 reps per side  Alt hip abduction, 2x5 reps per side   Alt hip extension, 2x5 reps per side Pt donned bilateral prostheses following activity independently  At counter top, side stepping w/no UE support, x2 reps down and back for improved hip abduction/adduction strength and step clearance. CGA-min A throughout for steadying assist.   Ther Act  SciFit multi-peaks level 2 for 8 minutes using BUE/BLEs for dynamic cardiovascular conditioning and increased amplitude of stepping. Single rest break required at 5:30 minute mark. RPE of 6-7/10 following activity. Pt performed lateral transfer from SciFit to [facility] following activity due to fatigue    Gait pattern: step through pattern, decreased arm swing- Right, decreased stride length, antalgic, lateral hip instability, and wide BOS Distance walked: 115' plus various clinic distances  Assistive device utilized: Quad cane small base Level of assistance: CGA and Min A Comments: Pt required min A due to knee instability and fatigue this date, with single anterior LOB episode prior to riding Scifit.      PATIENT EDUCATION: Education details: Continue HEP Person educated: Patient Education method: Explanation Education comprehension: verbalized understanding and needs further education  HOME EXERCISE PROGRAM: Access Code: [email or web address] URL: [email or web address] Date: 03/04/2024 Prepared by: Merry Lofty  Exercises - Standing Hip Abduction with Counter Support  - 1 x daily - 7 x weekly - 3 sets - 10 reps - Standing March with Counter Support  - 1 x daily - 7 x weekly - 3 sets - 10 reps - Standing Hip Extension with Counter Support  - 1 x daily - 7 x weekly - 3 sets - 10 reps - Mini Squat with Counter Support  - 1 x daily - 7 x weekly - 3 sets - 10 reps - Sit to Stand with Counter Support  - 1 x daily - 7 x weekly - 3 sets - 5 reps -seated  march over  - Lower Trunk Rotation Stretch  - 1 x daily - 7 x weekly - 3 sets - 10 reps - Single Leg Bridge  - 1 x daily - 7 x weekly - 1-2 sets - 6-10 reps  GOALS: Goals reviewed with patient? Yes  SHORT TERM GOALS: Target date: 03/27/2024   Pt will perform floor transfer w/min A for improved fall recovery and safety at home  Baseline: Goal status: INITIAL  2.  Pt will improve 5 x STS to less than or equal to 21 seconds w/SBA to demonstrate improved functional strength and transfer efficiency.   Baseline: 26.5s  Goal status: INITIAL  3.  Pt will improve gait velocity to at least 1.2 ft/s w/LRAD and CGA for improved gait efficiency and independence  Baseline: 0.8 ft/s w/SBQC and CGA Goal status: INITIAL  4.  Pt will ascend/descend 12 steps w/single rail and CGA for improved safety in home environment  Baseline: Min A w/bilateral rails  Goal status: INITIAL   LONG TERM GOALS: Target date: 04/24/2024   Pt will perform floor transfer w/CGA for fall recovery and safety  Baseline:  Goal status: INITIAL  2.  Pt will improve 5 x STS to less than or  equal to 18 seconds mod I to demonstrate improved functional strength and transfer efficiency.  Baseline: 26.5s  Goal status: INITIAL  3.  Pt will improve gait velocity to at least 1.5 ft/s w/LRAD and SBA for improved gait efficiency and reduced fall risk   Baseline: 0.8 ft/s w/SBQC and CGA Goal status: INITIAL  4.  Pt will navigate 12 stairs w/single rail and SBA for improved safety in home environment.  Baseline: min A w/bilateral rails  Goal status: INITIAL   ASSESSMENT:  CLINICAL IMPRESSION: Emphasis of skilled PT session on posterior chain strength, single leg stability and LE coordination. Pt w/increased pain and fatigue levels this date but tolerated session well w/frequent rest breaks. Pt more unstable w/gait today, requiring min A > CGA due to R knee buckling and decreased step clearance of LLE. Pt very challenged by hip  extension and abduction exercises in tall kneel and demonstrates poor weight acceptance to R side w/fatigue. Continue POC.    OBJECTIVE IMPAIRMENTS: Abnormal gait, decreased activity tolerance, decreased balance, decreased cognition, decreased endurance, decreased knowledge of condition, decreased knowledge of use of DME, difficulty walking, decreased strength, decreased safety awareness, impaired perceived functional ability, impaired sensation, improper body mechanics, prosthetic dependency , and pain  ACTIVITY LIMITATIONS: carrying, lifting, bending, standing, squatting, stairs, transfers, bathing, dressing, hygiene/grooming, locomotion level, and caring for others  PARTICIPATION LIMITATIONS: meal prep, cleaning, laundry, medication management, interpersonal relationship, driving, shopping, community activity, and yard work  PERSONAL FACTORS: Fitness, Past/current experiences, and 1-2 comorbidities: bilateral BKA, CVA  are also affecting patient's functional outcome.   REHAB POTENTIAL: Good  CLINICAL DECISION MAKING: Evolving/moderate complexity  EVALUATION COMPLEXITY: Moderate  PLAN:  PT FREQUENCY: 2x/week  PT DURATION: 8 weeks  PLANNED INTERVENTIONS: 97164- PT Re-evaluation, 97110-Therapeutic exercises, 97530- Therapeutic activity, [id number]- Neuromuscular re-education, 97535- Self Care, 40981- Manual therapy, [id number]- Gait training, [phone]- Prosthetic training, [phone]- Electrical stimulation (manual), Patient/Family education, Balance training, Stair training, Dry Needling, and DME instructions  PLAN FOR NEXT SESSION: Monitor vitals. Stairs, floor transfers, hip strengthening, hamstring strengthening, progressing gait with SBQC vs no AD, tall kneel, side stepping    Shaquelle Hernon E Glorya Bartley, PT, DPT  03/18/2024, 4:17 PM

## 2024-03-21 ENCOUNTER — Ambulatory Visit: Admitting: Occupational Therapy

## 2024-03-21 ENCOUNTER — Ambulatory Visit

## 2024-03-22 ENCOUNTER — Other Ambulatory Visit: Payer: Self-pay

## 2024-03-22 ENCOUNTER — Other Ambulatory Visit

## 2024-03-22 DIAGNOSIS — G890 Central pain syndrome: Secondary | ICD-10-CM | POA: Diagnosis not present

## 2024-03-22 DIAGNOSIS — Z9483 Pancreas transplant status: Secondary | ICD-10-CM | POA: Diagnosis not present

## 2024-03-22 DIAGNOSIS — S88119A Complete traumatic amputation at level between knee and ankle, unspecified lower leg, initial encounter: Secondary | ICD-10-CM | POA: Diagnosis not present

## 2024-03-22 DIAGNOSIS — N1831 Chronic kidney disease, stage 3a: Secondary | ICD-10-CM | POA: Diagnosis not present

## 2024-03-22 DIAGNOSIS — G894 Chronic pain syndrome: Secondary | ICD-10-CM | POA: Diagnosis not present

## 2024-03-22 DIAGNOSIS — G546 Phantom limb syndrome with pain: Secondary | ICD-10-CM | POA: Diagnosis not present

## 2024-03-22 DIAGNOSIS — G8191 Hemiplegia, unspecified affecting right dominant side: Secondary | ICD-10-CM | POA: Diagnosis not present

## 2024-03-25 ENCOUNTER — Emergency Department (HOSPITAL_COMMUNITY)

## 2024-03-25 ENCOUNTER — Other Ambulatory Visit: Payer: Self-pay

## 2024-03-25 ENCOUNTER — Ambulatory Visit

## 2024-03-25 ENCOUNTER — Emergency Department (HOSPITAL_COMMUNITY): Admission: EM | Admit: 2024-03-25 | Discharge: 2024-03-25 | Disposition: A | Discharge status: Home / Self Care

## 2024-03-25 ENCOUNTER — Ambulatory Visit: Admitting: Occupational Therapy

## 2024-03-25 ENCOUNTER — Encounter (HOSPITAL_COMMUNITY): Payer: Self-pay

## 2024-03-25 VITALS — BP 186/80 | HR 66

## 2024-03-25 DIAGNOSIS — I10 Essential (primary) hypertension: Secondary | ICD-10-CM | POA: Diagnosis not present

## 2024-03-25 DIAGNOSIS — Z789 Other specified health status: Secondary | ICD-10-CM | POA: Diagnosis not present

## 2024-03-25 DIAGNOSIS — R4701 Aphasia: Secondary | ICD-10-CM | POA: Diagnosis not present

## 2024-03-25 DIAGNOSIS — R29818 Other symptoms and signs involving the nervous system: Secondary | ICD-10-CM

## 2024-03-25 DIAGNOSIS — R2681 Unsteadiness on feet: Secondary | ICD-10-CM

## 2024-03-25 DIAGNOSIS — R278 Other lack of coordination: Secondary | ICD-10-CM

## 2024-03-25 DIAGNOSIS — M6281 Muscle weakness (generalized): Secondary | ICD-10-CM | POA: Diagnosis not present

## 2024-03-25 DIAGNOSIS — Z9181 History of falling: Secondary | ICD-10-CM | POA: Diagnosis not present

## 2024-03-25 DIAGNOSIS — I639 Cerebral infarction, unspecified: Secondary | ICD-10-CM | POA: Diagnosis not present

## 2024-03-25 DIAGNOSIS — Z89512 Acquired absence of left leg below knee: Secondary | ICD-10-CM | POA: Diagnosis not present

## 2024-03-25 DIAGNOSIS — G4489 Other headache syndrome: Secondary | ICD-10-CM | POA: Diagnosis not present

## 2024-03-25 DIAGNOSIS — H538 Other visual disturbances: Secondary | ICD-10-CM | POA: Insufficient documentation

## 2024-03-25 DIAGNOSIS — G936 Cerebral edema: Secondary | ICD-10-CM | POA: Diagnosis not present

## 2024-03-25 DIAGNOSIS — R519 Headache, unspecified: Secondary | ICD-10-CM | POA: Insufficient documentation

## 2024-03-25 DIAGNOSIS — Z7982 Long term (current) use of aspirin: Secondary | ICD-10-CM | POA: Diagnosis not present

## 2024-03-25 DIAGNOSIS — R2689 Other abnormalities of gait and mobility: Secondary | ICD-10-CM | POA: Diagnosis not present

## 2024-03-25 DIAGNOSIS — H579 Unspecified disorder of eye and adnexa: Secondary | ICD-10-CM | POA: Diagnosis not present

## 2024-03-25 DIAGNOSIS — Z79899 Other long term (current) drug therapy: Secondary | ICD-10-CM | POA: Diagnosis not present

## 2024-03-25 DIAGNOSIS — R9089 Other abnormal findings on diagnostic imaging of central nervous system: Secondary | ICD-10-CM | POA: Diagnosis not present

## 2024-03-25 DIAGNOSIS — R41841 Cognitive communication deficit: Secondary | ICD-10-CM

## 2024-03-25 DIAGNOSIS — Z89511 Acquired absence of right leg below knee: Secondary | ICD-10-CM | POA: Diagnosis not present

## 2024-03-25 DIAGNOSIS — I6782 Cerebral ischemia: Secondary | ICD-10-CM | POA: Diagnosis not present

## 2024-03-25 DIAGNOSIS — Z7409 Other reduced mobility: Secondary | ICD-10-CM | POA: Diagnosis not present

## 2024-03-25 DIAGNOSIS — G9389 Other specified disorders of brain: Secondary | ICD-10-CM | POA: Diagnosis not present

## 2024-03-25 LAB — BASIC METABOLIC PANEL WITH GFR
Anion gap: 8 (ref 5–15)
BUN: 32 mg/dL — ABNORMAL HIGH (ref 6–20)
CO2: 23 mmol/L (ref 22–32)
Calcium: 9.2 mg/dL (ref 8.9–10.3)
Chloride: 106 mmol/L (ref 98–111)
Creatinine, Ser: 2.34 mg/dL — ABNORMAL HIGH (ref 0.61–1.24)
GFR, Estimated: 33 mL/min — ABNORMAL LOW (ref >60)
Glucose, Bld: 116 mg/dL — ABNORMAL HIGH (ref 70–99)
Potassium: 4.9 mmol/L (ref 3.5–5.1)
Sodium: 137 mmol/L (ref 135–145)

## 2024-03-25 LAB — CBC WITH DIFFERENTIAL/PLATELET
Abs Immature Granulocytes: 0.02 10*3/uL (ref 0.00–0.07)
Basophils Absolute: 0.1 10*3/uL (ref 0.0–0.1)
Basophils Relative: 1 %
Eosinophils Absolute: 0.1 10*3/uL (ref 0.0–0.5)
Eosinophils Relative: 2 %
HCT: 47.1 % (ref 39.0–52.0)
Hemoglobin: 14.9 g/dL (ref 13.0–17.0)
Immature Granulocytes: 0 %
Lymphocytes Relative: 31 %
Lymphs Abs: 1.8 10*3/uL (ref 0.7–4.0)
MCH: 30.9 pg (ref 26.0–34.0)
MCHC: 31.6 g/dL (ref 30.0–36.0)
MCV: 97.7 fL (ref 80.0–100.0)
Monocytes Absolute: 0.6 10*3/uL (ref 0.1–1.0)
Monocytes Relative: 11 %
Neutro Abs: 3.3 10*3/uL (ref 1.7–7.7)
Neutrophils Relative %: 55 %
Platelets: 207 10*3/uL (ref 150–400)
RBC: 4.82 MIL/uL (ref 4.22–5.81)
RDW: 13.9 % (ref 11.5–15.5)
WBC: 6 10*3/uL (ref 4.0–10.5)
nRBC: 0 % (ref 0.0–0.2)

## 2024-03-25 LAB — TROPONIN I (HIGH SENSITIVITY)
Troponin I (High Sensitivity): 8 ng/L (ref <18)
Troponin I (High Sensitivity): 8 ng/L (ref <18)

## 2024-03-25 MED ORDER — OXYCODONE HCL 10 MG PO TABS
5.0000 mg | ORAL_TABLET | Freq: Every day | ORAL | 0 refills | Status: DC
  Filled 2024-03-25: qty 150, 30d supply, fill #0

## 2024-03-25 MED ORDER — HYDRALAZINE HCL 25 MG PO TABS
50.0000 mg | ORAL_TABLET | Freq: Once | ORAL | Status: AC
  Administered 2024-03-25: 50 mg via ORAL
  Filled 2024-03-25: qty 2

## 2024-03-25 MED ORDER — ACETAMINOPHEN 325 MG PO TABS
650.0000 mg | ORAL_TABLET | Freq: Once | ORAL | Status: AC
  Administered 2024-03-25: 650 mg via ORAL
  Filled 2024-03-25: qty 2

## 2024-03-25 NOTE — ED Provider Notes (Signed)
 [place] EMERGENCY DEPARTMENT AT [facility] Provider Note   CSN: [id number] Arrival date & time: 03/25/24  1552     History  Chief Complaint  Patient presents with   Hypertension   Headache    [name] is a 48 y.o. male.  48 year old male with past medical history of hypertension and CVA in the past presenting to the emergency department today with headache and blurred vision.  This occurred while he was at physical therapy this morning.  The patient's wife states that he has been a little unsteady on his feet over the past few days that his blood pressures been higher than normal.  She reports that he has had a history of stroke in the past.  He was doing relatively well.  He states that his headache is mild in character.  Reports this is a 2 out of 10 on the pain scale.  Denies any severe headaches.  He denies any focal weakness, numbness, or tingling but states that he does feel some disequilibrium.  He was brought into the ER for further evaluation regarding this.  His last known normal again was 2 days ago at this point.   Hypertension Associated symptoms include headaches.  Headache      Home Medications Prior to Admission medications   Medication Sig Start Date End Date Taking? Authorizing Provider  acetaminophen (TYLENOL) 325 MG tablet Take 1-2 tablets (325-650 mg total) by mouth every 4 (four) hours as needed for mild pain (pain score 1-3). 10/18/23   Love, Evlyn Kanner, PA-C  aspirin EC 81 MG tablet Take 1 tablet (81 mg total) by mouth daily. Swallow whole. 09/29/23   Pokhrel, Rebekah Chesterfield, MD  carvedilol (COREG) 12.5 MG tablet Take 1 tablet (12.5 mg total) by mouth 2 (two) times daily with a meal. 01/15/24   [name], MD  clopidogrel (PLAVIX) 75 MG tablet Take 1 tablet (75 mg total) by mouth daily. Patient not taking: Reported on 02/28/2024 12/14/23   Erick Colace, MD  cycloSPORINE modified (GENGRAF) 25 MG capsule Take 4 capsules (100 mg  total) by mouth 2 (two) times daily. 09/29/23   Pokhrel, Rebekah Chesterfield, MD  cycloSPORINE modified (NEORAL) 50 MG capsule Take 1 capsule by mouth two times daily, Take along with 100 mg twice a day 01/04/24     hydrALAZINE (APRESOLINE) 50 MG tablet Take 1 tablet (50 mg total) by mouth 2 (two) times daily. 12/14/23   Kirsteins, Victorino Sparrow, MD  mycophenolate (MYFORTIC) 180 MG EC tablet Take 1 tablet (180 mg total) by mouth 2 (two) times daily. 01/15/24   [name], MD  Oxycodone HCl 10 MG TABS Take 0.5-1 tablets (5-10 mg total) by mouth 3 (three) times daily as needed for pain. 10/28/23     Oxycodone HCl 10 MG TABS Take 0.5-1 tablets (5-10 mg total) by mouth 4 (four) times daily as needed. 12/15/23     Oxycodone HCl 10 MG TABS Take 0.5-1 tablets (5-10 mg total) by mouth 5 (five) times daily as needed, 03/22/24     pantoprazole (PROTONIX) 40 MG tablet TAKE 1 TABLET(40 MG) BY MOUTH TWICE DAILY 01/15/24   [name], MD  predniSONE (DELTASONE) 5 MG tablet Take 5 mg by mouth daily. 06/20/13   [provider]  rosuvastatin (CRESTOR) 20 MG tablet Take 1 tablet (20 mg total) by mouth daily. 01/15/24   [name], MD  sulfamethoxazole-trimethoprim (BACTRIM) 400-80 MG tablet Take 1 tablet by mouth every Monday, Wednesday,  and Friday. 01/15/24   [name], MD  terazosin (HYTRIN) 2 MG capsule Take 2 capsules (4 mg total) by mouth at bedtime. 01/15/24   [name], MD      Allergies    Belatacept, Amlodipine, and Lisinopril    Review of Systems   Review of Systems  Neurological:  Positive for headaches.  All other systems reviewed and are negative.   Physical Exam Updated Vital Signs BP (!) 196/89   Pulse 64   Temp 98.5 F (36.9 C) (Oral)   Resp 12   Ht 6' (1.829 m)   Wt 98.4 kg   SpO2 98%   BMI 29.43 kg/m  Physical Exam Vitals and nursing note reviewed.   Gen: NAD Eyes: PERRL, EOMI, no obvious nystagmus HEENT: no oropharyngeal swelling Neck: trachea midline Resp: clear to  auscultation bilaterally Card: RRR, no murmurs, rubs, or gallops Abd: nontender, nondistended Extremities: no calf tenderness, no edema Vascular: 2+ radial pulses bilaterally, 2+ DP pulses bilaterally Neuro: Cranial nerves intact, the patient has equal strength and sensation throughout bilateral upper and lower extremities, no dysmetria on finger-to-nose testing Skin: no rashes Psyc: acting appropriately   ED Results / Procedures / Treatments   Labs (all labs ordered are listed, but only abnormal results are displayed) Labs Reviewed  BASIC METABOLIC PANEL WITH GFR - Abnormal; Notable for the following components:      Result Value   Glucose, Bld 116 (*)    BUN 32 (*)    Creatinine, Ser 2.34 (*)    GFR, Estimated 33 (*)    All other components within normal limits  CBC WITH DIFFERENTIAL/PLATELET  TROPONIN I (HIGH SENSITIVITY)  TROPONIN I (HIGH SENSITIVITY)    EKG None  Radiology MR BRAIN WO CONTRAST Result Date: 03/25/2024 CLINICAL DATA:  Neuro deficit, concern for stroke. EXAM: MRI HEAD WITHOUT CONTRAST TECHNIQUE: Multiplanar, multiecho pulse sequences of the brain and surrounding structures were obtained without intravenous contrast. COMPARISON:  MRI and MRA head 10/08/2023. FINDINGS: Brain: No acute infarct. Encephalomalacia and gliosis in the left parietal lobe compatible with remote infarct at the site of prior edema. Numerous additional areas of encephalomalacia and gliosis throughout the left centrum semiovale compatible with multiple remote infarcts. Additional encephalomalacia in the left inferior frontal gyrus with associated susceptibility suggestive of prior infarct with areas of remote hemorrhage. Scattered white matter signal abnormality in the periventricular and subcortical white matter. White matter extending from the posterior limb of the internal capsule into the left pons likely reflecting wallerian degeneration. No mass lesion or midline shift. Cerebellum is  unremarkable. Normal appearance of midline structures. The basilar cisterns are patent. No extra-axial fluid collections. Ventricles: Prominence of the lateral ventricles suggestive of underlying parenchymal volume loss. Vascular: Skull base flow voids are visualized. Skull and upper cervical spine: No focal abnormality. Sinuses/Orbits: Orbits are symmetric. Paranasal sinuses are clear. Other: Trace fluid in the right mastoid air cells. IMPRESSION: No acute intracranial abnormality. Multiple remote infarcts in the left frontoparietal lobes and left centrum semiovale corresponding to the regions of edema on the prior study. Associated susceptibility suggestive of remote hemorrhage. Mild chronic microvascular ischemic changes and parenchymal volume loss. Electronically Signed   By: Emily Filbert M.D.   On: 03/25/2024 21:24    Procedures Procedures    Medications Ordered in ED Medications  acetaminophen (TYLENOL) tablet 650 mg (650 mg Oral Given 03/25/24 1737)  hydrALAZINE (APRESOLINE) tablet 50 mg (50 mg Oral Given 03/25/24 2141)    ED Course/  Medical Decision Making/ A&P                                 Medical Decision Making 48 year old male with past medical history of hypertension and CVA in the past presenting to the emergency department today with headache as well as some dizziness and blurred vision.  The patient reports feeling little unsteady now for the past 48 hours.  He is clearly outside the window for acute neurointervention at this time but given his history we will obtain an MRI to further evaluate for possible CVA.  Will give patient Tylenol for his headache.  Will hold off on treating his blood pressure which is in the 180s and 190s systolic here.  I will reevaluate for ultimate disposition.  Based on description of his symptoms suspicion for subarachnoid hemorrhage is low at this time.  He is not have any findings on exam consistent with meningitis or encephalitis.  The patient's  work appears reassuring.  He does not have any findings consistent with endorgan dysfunction here.  His EKG does not show any acute ischemic findings and his troponins here are negative.  MRI is stable with no acute findings.  The patient's blood pressure is elevated.  He is given hydralazine.  He is requesting discharge and would like to go home before having his blood pressure rechecked which I do think is reasonable.  He is encouraged to follow-up with his primary care provider for reevaluation.  He is discharged with return precautions.  Amount and/or Complexity of Data Reviewed Labs: ordered. Radiology: ordered.  Risk OTC drugs. Prescription drug management.           Final Clinical Impression(s) / ED Diagnoses Final diagnoses:  Nonintractable headache, unspecified chronicity pattern, unspecified headache type  Hypertension, unspecified type    Rx / DC Orders ED Discharge Orders     None         Durwin Glaze, MD 03/25/24 2155

## 2024-03-25 NOTE — Therapy (Signed)
 OUTPATIENT PHYSICAL THERAPY NEURO TREATMENT   Patient Name: [name] MRN: [id number] DOB:1976-02-08, 48 y.o., male Today's Date: 03/25/2024   PCP: [name], MD REFERRING PROVIDER: [name], MD  END OF SESSION:  PT End of Session - 03/25/24 1426     Visit Number 6    Number of Visits 17    Date for PT Re-Evaluation 05/01/24    Authorization Type [employer] Dual complete    PT Start Time 1445    PT Stop Time 1522   left with EMS   PT Time Calculation (min) 37 min    Activity Tolerance Treatment limited secondary to medical complications (Comment)    Behavior During Therapy Flat affect               Past Medical History:  Diagnosis Date   AMPUTATION, BELOW KNEE, 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   Diabetes mellitus without complication [facility])    no since pancreas transplant   Dialysis patient [name]) 04/18/2012   "[facility]; [place], [place], [place]"   Gastroparesis    Gastropathy    GERD (gastroesophageal reflux disease)    Hypertension    MRSA infection    over 10 years ago per patient. in legs   Past Surgical History:  Procedure Laterality Date   AV FISTULA PLACEMENT  08/2011   left upper arm   BELOW KNEE LEG AMPUTATION  "it's been awhile"   bilaterally   CATARACT EXTRACTION  ~ 2011   right   COMBINED KIDNEY-PANCREAS TRANSPLANT  2014   ESOPHAGOGASTRODUODENOSCOPY N/A 12/30/2016   Procedure: ESOPHAGOGASTRODUODENOSCOPY (EGD);  Surgeon: Jeani Hawking, MD;  Location: [facility] ENDOSCOPY;  Service: Endoscopy;  Laterality: N/A;   OLECRANON BURSECTOMY Right 06/23/2020   Procedure: RIGHT ELBOW EXCISION OLECRANON BURSITIS;  Surgeon: Nadara Mustard, MD;  Location: [facility];  Service: Orthopedics;  Laterality: Right;   Patient Active Problem List   Diagnosis Date Noted   Mass  of right side of neck 02/20/2024   Ischemic cerebrovascular accident (CVA) due to global hypoperfusion with watershed infarction [facility]) 09/20/2023   Hypertension secondary to other renal disorders 09/18/2023   PAD (peripheral artery disease) (HCC) 09/04/2023   Diabetes mellitus type II, controlled (HCC) 09/04/2023   Abnormal electrocardiogram (ECG) (EKG) 07/20/2023   Chronic pain 07/04/2023   Tobacco dependence 07/04/2023   Marijuana dependence (HCC) 07/04/2023   Screen for colon cancer 01/10/2022   Balanitis 01/10/2022   Encounter for general adult medical examination with abnormal findings 01/10/2022   Screening for prostate cancer 01/10/2022   Healthcare maintenance 08/26/2016   Impaired mobility and activities of daily living 07/03/2014   Renal transplant, status post 07/19/2013   History of pancreas transplant (HCC) 07/19/2013   Immunosuppressed status (HCC) 06/27/2013   H/O pancreas transplant (HCC) 06/27/2013   Diabetic gastroparesis associated with type 1 diabetes mellitus (HCC) 05/08/2013   S/P bilateral BKA (below knee amputation) (HCC) 08/17/2012   Metabolic bone disease 07/20/2011   ESRD (end stage renal disease) (HCC) 11/17/2009   GERD 03/14/2007   Dyslipidemia, goal LDL below 100 02/22/2007   Major depressive disorder, recurrent episode (HCC) 02/22/2007   POST TRAUMATIC STRESS DISORDER 02/22/2007   IMPOTENCE, 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    ONSET DATE: 02/10/2024 (  referral)   REFERRING DIAG: [phone] (ICD-10-CM) - S/P bilateral BKA (below knee amputation) (HCC) Z74.09,Z78.9 (ICD-10-CM) - Impaired mobility and activities of daily living I63.9 (ICD-10-CM) - Ischemic cerebrovascular accident (CVA) due to global hypoperfusion with watershed infarction [facility])  THERAPY DIAG:  Muscle weakness (generalized)  Unsteadiness on feet  Other abnormalities of gait and mobility  Other lack of coordination  Rationale for Evaluation and Treatment: Rehabilitation  SUBJECTIVE:                                                                                                                                                                                              SUBJECTIVE STATEMENT: Patient reports being "alright." Denies falls.   Pt accompanied by: self  PERTINENT HISTORY: PAD, HTN, Strokes, s/p Kidney and Pancreas Transplant, Carotid Stenosis, T1DM, ESRD, bilateral BKA amputee   PAIN: "I am always in pain" Are you having pain? Yes: NPRS scale: 6/10 Pain location: B LE Pain description: soreness   PRECAUTIONS: Fall  PATIENT GOALS: " To get my walk as close as I can to where I was"   OBJECTIVE:  Note: Objective measures were completed at Evaluation unless otherwise noted.  DIAGNOSTIC FINDINGS: CT of head from 10/11/23  IMPRESSION: Evolving left MCA territory infarcts involving the left frontal and parietal lobes, better seen on recent prior brain MRI. Redemonstrated petechial hemorrhage along the posterior left frontal lobe.  MRI of head from 09/18/23 IMPRESSION: 1. Scattered small foci of acute subcortical infarction in the left occipital lobe and posterior left frontal lobe. 2. Faint diffusion signal abnormality along the left parietal lobe may reflect evolving infarct versus seizure related cytotoxic edema. 3. 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  VITALS  Vitals:   03/25/24 1455 03/25/24 1458  BP: (!) 189/74 (!) 186/80  Pulse: 64 66                                                                                                                              TREATMENT:   Self-care/home management  Assessed vitals (see above)  Patient  reports 7/10 HA and onset of blurred vision this AM not necessarily corrected by donning his glasses -PT recommending ER based on elevated BP + symptoms and recent h/o CVA -PT contacted patients wife who requested that patient be brought by ambulance to ER  -PT called  [phone]  -monitored by PT until EMS arrived and patient left via ambulance with B prosthetics and personal wc  PATIENT EDUCATION: Education details: Continue HEP, monitor BP at home Person educated: Patient Education method: Explanation Education comprehension: verbalized understanding and needs further education  HOME EXERCISE PROGRAM: Access Code: [email or web address] URL: [email or web address] Date: 03/04/2024 Prepared by: Merry Lofty  Exercises - Standing Hip Abduction with Counter Support  - 1 x daily - 7 x weekly - 3 sets - 10 reps - Standing March with Counter Support  - 1 x daily - 7 x weekly - 3 sets - 10 reps - Standing Hip Extension with Counter Support  - 1 x daily - 7 x weekly - 3 sets - 10 reps - Mini Squat with Counter Support  - 1 x daily - 7 x weekly - 3 sets - 10 reps - Sit to Stand with Counter Support  - 1 x daily - 7 x weekly - 3 sets - 5 reps -seated march over  - Lower Trunk Rotation Stretch  - 1 x daily - 7 x weekly - 3 sets - 10 reps - Single Leg Bridge  - 1 x daily - 7 x weekly - 1-2 sets - 6-10 reps  GOALS: Goals reviewed with patient? Yes  SHORT TERM GOALS: Target date: 03/27/2024   Pt will perform floor transfer w/min A for improved fall recovery and safety at home  Baseline: Goal status: INITIAL  2.  Pt will improve 5 x STS to less than or equal to 21 seconds w/SBA to demonstrate improved functional strength and transfer efficiency.   Baseline: 26.5s  Goal status: INITIAL  3.  Pt will improve gait velocity to at least 1.2 ft/s w/LRAD and CGA for improved gait efficiency and independence  Baseline: 0.8 ft/s w/SBQC and CGA Goal status: INITIAL  4.  Pt will ascend/descend 12 steps w/single rail and CGA for improved safety in home environment  Baseline: Min A w/bilateral rails  Goal status: INITIAL   LONG TERM GOALS: Target date: 04/24/2024   Pt will perform floor transfer w/CGA for fall recovery and safety  Baseline:  Goal status:  INITIAL  2.  Pt will improve 5 x STS to less than or equal to 18 seconds mod I to demonstrate improved functional strength and transfer efficiency.  Baseline: 26.5s  Goal status: INITIAL  3.  Pt will improve gait velocity to at least 1.5 ft/s w/LRAD and SBA for improved gait efficiency and reduced fall risk   Baseline: 0.8 ft/s w/SBQC and CGA Goal status: INITIAL  4.  Pt will navigate 12 stairs w/single rail and SBA for improved safety in home environment.  Baseline: min A w/bilateral rails  Goal status: INITIAL   ASSESSMENT:  CLINICAL IMPRESSION: Patient seen for skilled PT session with emphasis on patient education. Patient received from OT with elevated BP + symptoms including blurred vision and a HA rated at 7/10. His blurred vision is not resolved by his glasses. Patients wife unable to come to clinic to transport patient to ED. PT ultimately calling 911. Patient left clinic with EMS with B prosthetics and his personal wheelchair. Continue POC as able.    OBJECTIVE IMPAIRMENTS: Abnormal gait, decreased activity  tolerance, decreased balance, decreased cognition, decreased endurance, decreased knowledge of condition, decreased knowledge of use of DME, difficulty walking, decreased strength, decreased safety awareness, impaired perceived functional ability, impaired sensation, improper body mechanics, prosthetic dependency , and pain  ACTIVITY LIMITATIONS: carrying, lifting, bending, standing, squatting, stairs, transfers, bathing, dressing, hygiene/grooming, locomotion level, and caring for others  PARTICIPATION LIMITATIONS: meal prep, cleaning, laundry, medication management, interpersonal relationship, driving, shopping, community activity, and yard work  PERSONAL FACTORS: Fitness, Past/current experiences, and 1-2 comorbidities: bilateral BKA, CVA  are also affecting patient's functional outcome.   REHAB POTENTIAL: Good  CLINICAL DECISION MAKING: Evolving/moderate  complexity  EVALUATION COMPLEXITY: Moderate  PLAN:  PT FREQUENCY: 2x/week  PT DURATION: 8 weeks  PLANNED INTERVENTIONS: 97164- PT Re-evaluation, 97110-Therapeutic exercises, 97530- Therapeutic activity, [id number]- Neuromuscular re-education, 97535- Self Care, 16109- Manual therapy, [id number]- Gait training, [phone]- Prosthetic training, [phone]- Electrical stimulation (manual), Patient/Family education, Balance training, Stair training, Dry Needling, and DME instructions  PLAN FOR NEXT SESSION: Monitor vitals. Stairs, floor transfers, hip strengthening, hamstring strengthening, progressing gait with SBQC vs no AD, tall kneel, side stepping    Westley Foots, PT Westley Foots, PT, DPT, CBIS 03/25/2024, 3:33 PM

## 2024-03-25 NOTE — Discharge Instructions (Addendum)
 Your workup today was reassuring.  Your MRI did not show any new findings.  Please follow-up with your doctor to have your blood pressure rechecked as an outpatient.  Return to the ER for worsening symptoms.

## 2024-03-25 NOTE — ED Triage Notes (Signed)
 Per EMS, Pt, from [facility], c/o blurred vision and headache starting today.  Pain score 2/10.  Pt noted to have BP 260/110.  Sts he took his HTN medication this morning.    Hx of stroke 08/2023.    Pt reports he is at his baseline.

## 2024-03-25 NOTE — Therapy (Signed)
 OUTPATIENT OCCUPATIONAL THERAPY NEURO TREATMENT  Patient Name: [name] MRN: [id number] DOB:[birth year], 48 y.o., male Today's Date: 03/25/2024  PCP: Sanda Linger, MD REFERRING PROVIDER: [name], MD  END OF SESSION:  OT End of Session - 03/25/24 1439     Visit Number 6    Number of Visits 17    Date for OT Re-Evaluation 04/26/24    Authorization Type UHC - Medicare    OT Start Time 1414   pt arriving 14 min late   OT Stop Time 1446    OT Time Calculation (min) 32 min    Activity Tolerance Patient tolerated treatment well    Behavior During Therapy WFL for tasks assessed/performed             Past Medical History:  Diagnosis Date   AMPUTATION, BELOW KNEE, 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   Diabetes mellitus without complication [facility])    no since pancreas transplant   Dialysis patient [facility]) 04/18/2012   "[facility]; [place], [place], [place]"   Gastroparesis    Gastropathy    GERD (gastroesophageal reflux disease)    Hypertension    MRSA infection    over 10 years ago per patient. in legs   Past Surgical History:  Procedure Laterality Date   AV FISTULA PLACEMENT  08/2011   left upper arm   BELOW KNEE LEG AMPUTATION  "it's been awhile"   bilaterally   CATARACT EXTRACTION  ~ 2011   right   COMBINED KIDNEY-PANCREAS TRANSPLANT  2014   ESOPHAGOGASTRODUODENOSCOPY N/A 12/30/2016   Procedure: ESOPHAGOGASTRODUODENOSCOPY (EGD);  Surgeon: Jeani Hawking, MD;  Location: [facility] ENDOSCOPY;  Service: Endoscopy;  Laterality: N/A;   OLECRANON BURSECTOMY Right 06/23/2020   Procedure: RIGHT ELBOW EXCISION OLECRANON BURSITIS;  Surgeon: Nadara Mustard, MD;  Location: [facility];  Service: Orthopedics;  Laterality: Right;   Patient Active Problem List   Diagnosis Date Noted   Mass of right  side of neck 02/20/2024   Ischemic cerebrovascular accident (CVA) due to global hypoperfusion with watershed infarction [facility]) 09/20/2023   Hypertension secondary to other renal disorders 09/18/2023   PAD (peripheral artery disease) (HCC) 09/04/2023   Diabetes mellitus type II, controlled (HCC) 09/04/2023   Abnormal electrocardiogram (ECG) (EKG) 07/20/2023   Chronic pain 07/04/2023   Tobacco dependence 07/04/2023   Marijuana dependence (HCC) 07/04/2023   Screen for colon cancer 01/10/2022   Balanitis 01/10/2022   Encounter for general adult medical examination with abnormal findings 01/10/2022   Screening for prostate cancer 01/10/2022   Healthcare maintenance 08/26/2016   Impaired mobility and activities of daily living 07/03/2014   Renal transplant, status post 07/19/2013   History of pancreas transplant (HCC) 07/19/2013   Immunosuppressed status (HCC) 06/27/2013   H/O pancreas transplant (HCC) 06/27/2013   Diabetic gastroparesis associated with type 1 diabetes mellitus (HCC) 05/08/2013   S/P bilateral BKA (below knee amputation) (HCC) 08/17/2012   Metabolic bone disease 07/20/2011   ESRD (end stage renal disease) (HCC) 11/17/2009   GERD 03/14/2007   Dyslipidemia, goal LDL below 100 02/22/2007   Major depressive disorder, recurrent episode (HCC) 02/22/2007   POST TRAUMATIC STRESS DISORDER 02/22/2007   IMPOTENCE, 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    ONSET DATE: 02/10/2024 (Date of referral)  REFERRING DIAG: [phone] (ICD-10-CM) - S/P bilateral BKA (below knee amputation) (HCC) Z74.09,Z78.9 (ICD-10-CM) - Impaired mobility and activities of daily living I63.9 (ICD-10-CM) - Ischemic cerebrovascular accident (CVA) due to global hypoperfusion with watershed infarction  THERAPY DIAG:  Muscle weakness (generalized)  Other lack of coordination  Other symptoms and signs involving the nervous system  Acute CVA (cerebrovascular accident) (HCC)  Cognitive communication deficit  Ischemic  cerebrovascular accident (CVA) due to global hypoperfusion with watershed infarction [facility])  Rationale for Evaluation and Treatment: Rehabilitation  SUBJECTIVE:   SUBJECTIVE STATEMENT: From eval: 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   Pt reports he has not played a lot of Golf Solitaire at home.   Pt accompanied by: self  PERTINENT HISTORY:  PAD, HTN, Strokes, s/p Kidney and Pancreas Transplant, Carotid Stenosis, T1DM, ESRD, bilateral BKA amputee   PRECAUTIONS: None  WEIGHT BEARING RESTRICTIONS: No  PAIN:  Are you having pain? Yes: NPRS scale: 7/10 Pain location: all over - had an incident of over exertion yesterday Pain description: soreness Aggravating factors: depends- doing stuff (over exerted self but no choice) Relieving factors: depends - pain meds, rest/laying down  FALLS: Has patient fallen in last 6 months? Yes. Number of falls 1  LIVING ENVIRONMENT: Lives with: lives with their spouse Lives in: House/apartment Stairs: Yes: Internal: 15 steps; on right going up and External: 5 steps; bilateral but cannot reach both Has following equipment at home: Quad cane small base, Walker - 2 wheeled, Wheelchair (manual), and shower chair  PLOF: Independent; was driving;   PATIENT GOALS: get back to PLOF  OBJECTIVE:  Note: Objective measures were completed at Evaluation unless otherwise noted.  HAND DOMINANCE: Right  ADLs: Overall ADLs: mod I   IADLs: Light housekeeping: sweeping, gathers up laundry, dishes  Meal Prep: mod I Community mobility: dependent Medication management: setup Handwriting: 50% legible, Increased time, and due to cognitive impairments  MOBILITY STATUS: Independent - manual wheelchair  ACTIVITY TOLERANCE: Activity tolerance: fair  FUNCTIONAL OUTCOME MEASURES:  03/04/24 - PSFS - 3.3   Total score = sum of the activity scores/number of activities Minimum detectable change (90%CI) for average score = 2 points Minimum detectable change (90%CI) for  single activity score = 3 points   UPPER EXTREMITY ROM:  Impaired R supination ~ neutral though able to improve to [facility] with increased time and awareness.     BUE - WNL though weaker on R vs L  HAND FUNCTION: Grip strength: Right: 81.5 lbs; Left: 97.8 lbs  COORDINATION: 9 Hole Peg test: Right: 47 sec; Left: 33 sec - mild tremor in R hand  SENSATION: WFL  EDEMA: none reported or observed  MUSCLE TONE:WFL  COGNITION: Overall cognitive status: Impaired - see ST for further details  VISION: Subjective report: Pt reports vision fluctuates including episodes of double vision and L visual field cut Baseline vision: Bifocals, Wears glasses all the time, and ST bifocals Visual history: cataracts and corrective eye surgery  VISION ASSESSMENT: WFL distance and near acuity; ~45* L visual field  PERCEPTION: Impaired: secondary to visual impairments; pt required cues to locate hand sanitizer on table, reaching to Kleenex originally  PRAXIS: Impaired: Motor planning and potential ideational praxis  OBSERVATIONS: Pt appears well-kept. Glasses hanging on shirt. Pt able to self-propel manual wheelchair.  TREATMENT :  OT reviewed table top play of Solitaire for BUE ROM, coordination, visual processing, scanning, and sequencing. Pt required minimal cues for proper play.   OT educated pt on the game of Trash Dice for BUE ROM, coordination, visual processing, scanning, and sequencing. Pt required minimal cues for proper play.   OT reviewed vision HEP from previous session and added oculomotor saccades including: Holding two stationary Xs placed on business cards and placed arm's width apart, pt moved eyes quickly from target to target keeping head still side-to-side/up-down/diagonally 10 x each direction.  PATIENT EDUCATION: Education details: vision HEPs and functional  use of RUE Person educated: Patient Education method: Explanation, Demonstration, and Verbal cues Education comprehension: verbalized understanding, returned demonstration, verbal cues required, and needs further education  HOME EXERCISE PROGRAM: 03/04/24 - tremor compensation strategies 03/11/2024: vision strategies and visual scanning 03/14/24 - vision HEP (see pt instructions)  GOALS:  SHORT TERM GOALS: Target date: 03/27/2024   Patient will demonstrate initial RUE HEP with 25% verbal cues or less for proper execution. Goal status: IN Progress  2.  Patient will independently recall at least 2 compensatory strategies for visual impairment without cueing.  Goal status: IN Progress  3.  Patient will demonstrate at least 85% accuracy with environmental visual scanning to assist with ADLs and IADLS including potential driving considerations.  Goal status: IN Progress  4.  Pt will independently recall at least 2 tremor reduction strategies.   Baseline:  03/04/24 - OT educated pt on tremor compensation strategies. Pt returned demo. Goal status: in progress  LONG TERM GOALS: Target date: 04/26/2024  Patient will demonstrate updated RUE HEP with visual handouts only for proper execution.  Goal status: IN Progress  2.  03/04/24 revised - Patient will report at least two-point increase in average PSFS score or at least three-point increase in a single activity score indicating functionally significant improvement given minimum detectable change. Baseline: PSFS - 3.3 total score (See above for individual activity scores)  Goal status: INITIAL  3.  Patient will demo improved FM coordination as evidenced by completing nine-hole peg with use of R in 35 seconds or less. Baseline: Right: 47 secs Goal status: IN Progress  4.  Patient will demonstrate at least 88 lbs R grip strength as needed to open jars and other containers. Baseline: Right: 81.5 lbs Goal status: IN  Progress  ASSESSMENT:  CLINICAL IMPRESSION: Patient demonstrates good tolerance with completion of tasks this visit as needed to progress towards goals.  PERFORMANCE DEFICITS: in functional skills including ADLs, IADLs, coordination, proprioception, sensation, strength, Fine motor control, decreased knowledge of precautions, decreased knowledge of use of DME, vision, and UE functional use, cognitive skills including attention, memory, and problem solving, and psychosocial skills including coping strategies.   IMPAIRMENTS: are limiting patient from ADLs, IADLs, rest and sleep, leisure, and social participation.   CO-MORBIDITIES: may have co-morbidities  that affects occupational performance. Patient will benefit from skilled OT to address above impairments and improve overall function.  REHAB POTENTIAL: Fair given comorbidities  PLAN:  OT FREQUENCY: 2x/week  OT DURATION: 8 weeks  PLANNED INTERVENTIONS: 97168 OT Re-evaluation, 97535 self care/ADL training, 44034 therapeutic exercise, 97530 therapeutic activity, 97112 neuromuscular re-education, 97140 manual therapy, visual/perceptual remediation/compensation, coping strategies training, patient/family education, and DME and/or AE instructions  RECOMMENDED OTHER SERVICES: N/A for this visit  CONSULTED AND AGREED WITH PLAN OF CARE: Patient and family member/caregiver  PLAN FOR NEXT SESSION: assess STG Review PRN - tremor reduction strategies, vision compensation  and scanning activities, diplopia HEP, oculomotor saccades  Visual comp strategies/handout Continued progression of FM coordination HEP and/or activities, continue writing activities Add R theraband HEP  Delana Meyer, OT 03/25/2024, 2:53 PM

## 2024-03-28 ENCOUNTER — Ambulatory Visit: Admitting: Occupational Therapy

## 2024-03-28 ENCOUNTER — Ambulatory Visit: Admitting: Physical Therapy

## 2024-03-28 ENCOUNTER — Ambulatory Visit: Admitting: Speech Pathology

## 2024-04-01 ENCOUNTER — Ambulatory Visit: Attending: Internal Medicine | Admitting: Occupational Therapy

## 2024-04-01 ENCOUNTER — Ambulatory Visit: Admitting: Physical Therapy

## 2024-04-01 ENCOUNTER — Other Ambulatory Visit: Admitting: Pharmacist

## 2024-04-01 VITALS — BP 148/69 | HR 70

## 2024-04-01 DIAGNOSIS — R29818 Other symptoms and signs involving the nervous system: Secondary | ICD-10-CM | POA: Diagnosis not present

## 2024-04-01 DIAGNOSIS — I639 Cerebral infarction, unspecified: Secondary | ICD-10-CM

## 2024-04-01 DIAGNOSIS — M6281 Muscle weakness (generalized): Secondary | ICD-10-CM | POA: Diagnosis not present

## 2024-04-01 DIAGNOSIS — R4701 Aphasia: Secondary | ICD-10-CM | POA: Insufficient documentation

## 2024-04-01 DIAGNOSIS — R278 Other lack of coordination: Secondary | ICD-10-CM | POA: Insufficient documentation

## 2024-04-01 DIAGNOSIS — R2681 Unsteadiness on feet: Secondary | ICD-10-CM | POA: Insufficient documentation

## 2024-04-01 DIAGNOSIS — R41841 Cognitive communication deficit: Secondary | ICD-10-CM | POA: Insufficient documentation

## 2024-04-01 DIAGNOSIS — R2689 Other abnormalities of gait and mobility: Secondary | ICD-10-CM | POA: Diagnosis not present

## 2024-04-01 DIAGNOSIS — E785 Hyperlipidemia, unspecified: Secondary | ICD-10-CM

## 2024-04-01 DIAGNOSIS — I10 Essential (primary) hypertension: Secondary | ICD-10-CM

## 2024-04-01 DIAGNOSIS — I151 Hypertension secondary to other renal disorders: Secondary | ICD-10-CM

## 2024-04-01 MED ORDER — HYDRALAZINE HCL 50 MG PO TABS: 50.0000 mg | ORAL_TABLET | Freq: Three times a day (TID) | ORAL | 0 refills | Status: DC

## 2024-04-01 MED ORDER — ATORVASTATIN CALCIUM 40 MG PO TABS: 40.0000 mg | ORAL_TABLET | Freq: Every day | ORAL | 1 refills | Status: DC

## 2024-04-01 NOTE — Therapy (Signed)
 OUTPATIENT OCCUPATIONAL THERAPY NEURO TREATMENT  Patient Name: Emon Lance MRN: [id number] DOB:September 28, 1976, 48 y.o., male Today's Date: 04/01/2024  PCP: Sanda Linger, MD REFERRING PROVIDER: [name], MD  END OF SESSION:  OT End of Session - 04/01/24 1546     Visit Number 7    Number of Visits 17    Date for OT Re-Evaluation 04/26/24    Authorization Type UHC - Medicare    OT Start Time 1403    OT Stop Time 1445    OT Time Calculation (min) 42 min    Activity Tolerance Patient tolerated treatment well    Behavior During Therapy WFL for tasks assessed/performed              Past Medical History:  Diagnosis Date   AMPUTATION, BELOW KNEE, 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   Diabetes mellitus without complication [facility])    no since pancreas transplant   Dialysis patient [facility]) 04/18/2012   "[name]; [place], [place], [place]"   Gastroparesis    Gastropathy    GERD (gastroesophageal reflux disease)    Hypertension    MRSA infection    over 10 years ago per patient. in legs   Past Surgical History:  Procedure Laterality Date   AV FISTULA PLACEMENT  08/2011   left upper arm   BELOW KNEE LEG AMPUTATION  "it's been awhile"   bilaterally   CATARACT EXTRACTION  ~ 2011   right   COMBINED KIDNEY-PANCREAS TRANSPLANT  2014   ESOPHAGOGASTRODUODENOSCOPY N/A 12/30/2016   Procedure: ESOPHAGOGASTRODUODENOSCOPY (EGD);  Surgeon: Jeani Hawking, MD;  Location: [facility] ENDOSCOPY;  Service: Endoscopy;  Laterality: N/A;   OLECRANON BURSECTOMY Right 06/23/2020   Procedure: RIGHT ELBOW EXCISION OLECRANON BURSITIS;  Surgeon: Nadara Mustard, MD;  Location: [facility];  Service: Orthopedics;  Laterality: Right;   Patient Active Problem List   Diagnosis Date Noted   Mass of right side of neck 02/20/2024    Ischemic cerebrovascular accident (CVA) due to global hypoperfusion with watershed infarction [facility]) 09/20/2023   Hypertension secondary to other renal disorders 09/18/2023   PAD (peripheral artery disease) (HCC) 09/04/2023   Diabetes mellitus type II, controlled (HCC) 09/04/2023   Abnormal electrocardiogram (ECG) (EKG) 07/20/2023   Chronic pain 07/04/2023   Tobacco dependence 07/04/2023   Marijuana dependence (HCC) 07/04/2023   Screen for colon cancer 01/10/2022   Balanitis 01/10/2022   Encounter for general adult medical examination with abnormal findings 01/10/2022   Screening for prostate cancer 01/10/2022   Healthcare maintenance 08/26/2016   Impaired mobility and activities of daily living 07/03/2014   Renal transplant, status post 07/19/2013   History of pancreas transplant (HCC) 07/19/2013   Immunosuppressed status (HCC) 06/27/2013   H/O pancreas transplant (HCC) 06/27/2013   Diabetic gastroparesis associated with type 1 diabetes mellitus (HCC) 05/08/2013   S/P bilateral BKA (below knee amputation) (HCC) 08/17/2012   Metabolic bone disease 07/20/2011   ESRD (end stage renal disease) (HCC) 11/17/2009   GERD 03/14/2007   Dyslipidemia, goal LDL below 100 02/22/2007   Major depressive disorder, recurrent episode (HCC) 02/22/2007   POST TRAUMATIC STRESS DISORDER 02/22/2007   IMPOTENCE, 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    ONSET DATE: 02/10/2024 (Date of referral)  REFERRING DIAG: Z89.512,Z89.511 (ICD-10-CM) -  S/P bilateral BKA (below knee amputation) (HCC) Z74.09,Z78.9 (ICD-10-CM) - Impaired mobility and activities of daily living I63.9 (ICD-10-CM) - Ischemic cerebrovascular accident (CVA) due to global hypoperfusion with watershed infarction  THERAPY DIAG:  Muscle weakness (generalized)  Other lack of coordination  Other symptoms and signs involving the nervous system  Acute CVA (cerebrovascular accident) (HCC)  Cognitive communication deficit  Ischemic cerebrovascular accident  (CVA) due to global hypoperfusion with watershed infarction [facility])  Rationale for Evaluation and Treatment: Rehabilitation  SUBJECTIVE:   SUBJECTIVE STATEMENT: From eval: 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   ED visit noted on 03/25/24 d/t high BP and pt symptomatic. Pt reported change to BP medication (increased dosage) following doctor's visit earlier today. Pt has not yet started new medication regimen d/t adjustments made at doctor's appointment a few hours ago. Pt reported no headache pain today 0/10. Pt reported continued L vision changes: "I almost can't see nothing." Pt reported the L visual field changes are a newer symptom but have been present for at least a few days.  Pt accompanied by: self  PERTINENT HISTORY:  PAD, HTN, Strokes, s/p Kidney and Pancreas Transplant, Carotid Stenosis, T1DM, ESRD, bilateral BKA amputee   PRECAUTIONS: None  WEIGHT BEARING RESTRICTIONS: No  PAIN:  Are you having pain? No  FALLS: Has patient fallen in last 6 months? Yes. Number of falls 1  LIVING ENVIRONMENT: Lives with: lives with their spouse Lives in: House/apartment Stairs: Yes: Internal: 15 steps; on right going up and External: 5 steps; bilateral but cannot reach both Has following equipment at home: Quad cane small base, Walker - 2 wheeled, Wheelchair (manual), and shower chair  PLOF: Independent; was driving;   PATIENT GOALS: get back to PLOF  OBJECTIVE:  Note: Objective measures were completed at Evaluation unless otherwise noted.  HAND DOMINANCE: Right  ADLs: Overall ADLs: mod I   IADLs: Light housekeeping: sweeping, gathers up laundry, dishes  Meal Prep: mod I Community mobility: dependent Medication management: setup Handwriting: 50% legible, Increased time, and due to cognitive impairments  MOBILITY STATUS: Independent - manual wheelchair  ACTIVITY TOLERANCE: Activity tolerance: fair  FUNCTIONAL OUTCOME MEASURES:  03/04/24 - PSFS - 3.3   Total score = sum of the  activity scores/number of activities Minimum detectable change (90%CI) for average score = 2 points Minimum detectable change (90%CI) for single activity score = 3 points  04/01/24 - 8.3    UPPER EXTREMITY ROM:  Impaired R supination ~ neutral though able to improve to [facility] with increased time and awareness.     BUE - WNL though weaker on R vs L  HAND FUNCTION: Grip strength: Right: 81.5 lbs; Left: 97.8 lbs  COORDINATION: 9 Hole Peg test: Right: 47 sec; Left: 33 sec - mild tremor in R hand  SENSATION: WFL  EDEMA: none reported or observed  MUSCLE TONE:WFL  COGNITION: Overall cognitive status: Impaired - see ST for further details  VISION: Subjective report: Pt reports vision fluctuates including episodes of double vision and L visual field cut Baseline vision: Bifocals, Wears glasses all the time, and ST bifocals Visual history: cataracts and corrective eye surgery  VISION ASSESSMENT: WFL distance and near acuity; ~45* L visual field  PERCEPTION: Impaired: secondary to visual impairments; pt required cues to locate hand sanitizer on table, reaching to Kleenex originally  PRAXIS: Impaired: Motor planning and potential ideational praxis  OBSERVATIONS: Pt appears well-kept. Glasses hanging on shirt. Pt able to self-propel manual wheelchair.  TREATMENT :  Self-Care Vital signs assessed, see vital signs. Pt denied symptoms of dizziness, lightheadedness, headache, blurry vision. Pt reported continued L vision changes but this symptom has been present for a few days. BP within therapeutic parameters. Pt reported taking medication and "I feel great." OT educated pt on vital signs parameters, importance of medication management. Pt verbalized understanding.  OT educated pt on visual compensation strategies. Pt verbalized understanding.   TherAct OT  assessed pt's progress towards goals, see below for updates.   OT reviewed vision HEP. Pt returned demo with mod v/c. Pt reported L eye fatigue "in general." Pt reported upcoming eye doctor's appointment in May 2025.   Visual scanning activity in busy clinic environment in w/c - locating x10 yellow cards - to improve understanding of vision compensation strategies, to improve activity tolerance, to improve visual scanning. - Pt completed visual scanning task with 80% accuracy in clinic while self-propelling w/c. OT educated pt on vision compensation strategies. Pt verbalized understanding and returned demo of many strategies. Pt missed x2 cards on affected L side which were located in busier background environments. Pt safely and efficiently navigated clinic with w/c.  PATIENT EDUCATION: Education details: see today's tx above Person educated: Patient Education method: Explanation, Demonstration, and Verbal cues Education comprehension: verbalized understanding, returned demonstration, verbal cues required, and needs further education  HOME EXERCISE PROGRAM: 03/04/24 - tremor compensation strategies 03/11/2024: vision strategies and visual scanning 03/14/24 - vision HEP (see pt instructions)  GOALS:  SHORT TERM GOALS: Target date: 03/27/2024   Patient will demonstrate initial RUE HEP with 25% verbal cues or less for proper execution. 04/01/24 - Pt benefited from v/c.  Goal status: IN Progress  2.  Patient will independently recall at least 2 compensatory strategies for visual impairment without cueing.  04/01/24 - With min v/c, pt recalled turning head to look and looking for the edges of objects.  Goal status: IN Progress   3.  Patient will demonstrate at least 85% accuracy with environmental visual scanning to assist with ADLs and IADLS including potential driving considerations.  04/01/24 - Pt completed visual scanning task in clinic with 80% accuracy while self-propelling w/c. Goal status:  IN Progress  4.  Pt will independently recall at least 2 tremor reduction strategies.   Baseline:  03/04/24 - OT educated pt on tremor compensation strategies. Pt returned demo. 04/01/24 - Pt ind recalled using weighted pen and stabilize arms on table. Goal status: MET  LONG TERM GOALS: Target date: 04/26/2024  Patient will demonstrate updated RUE HEP with visual handouts only for proper execution.  Goal status: IN Progress  2.  03/04/24 revised - Patient will report at least two-point increase in average PSFS score or at least three-point increase in a single activity score indicating functionally significant improvement given minimum detectable change. Baseline: PSFS - 3.3 total score (See above for individual activity scores)  04/01/24 - 8.3 Goal status: MET  3.  Patient will demo improved FM coordination as evidenced by completing nine-hole peg with use of R in 35 seconds or less. Baseline: Right: 47 secs 04/01/24 - Trial 1 - Right: 57 seconds, Trial 2 - Right: 38 seconds Goal status: IN Progress  4.  Patient will demonstrate at least 88 lbs R grip strength as needed to open jars and other containers. Baseline: Right: 81.5 lbs 04/01/24 - Right: 85.5, 81.1, 73.6 (80.06 lbs average) Goal status: IN Progress  ASSESSMENT:  CLINICAL IMPRESSION: Pt met 1 STG and 1 LTG today, indicating  steady progress towards goals. Pt maintained RUE strength and demo'd improved FM coordiation with RUE. Pt returned demo of visual scanning and compensation strategies though may benefit from additional education/review. Pt's BP noted to be within more therapeutic parameters today, see vital signs. Patient demonstrates good tolerance with completion of tasks this visit as needed to progress towards goals.  PERFORMANCE DEFICITS: in functional skills including ADLs, IADLs, coordination, proprioception, sensation, strength, Fine motor control, decreased knowledge of precautions, decreased knowledge of use of DME, vision,  and UE functional use, cognitive skills including attention, memory, and problem solving, and psychosocial skills including coping strategies.   IMPAIRMENTS: are limiting patient from ADLs, IADLs, rest and sleep, leisure, and social participation.   CO-MORBIDITIES: may have co-morbidities  that affects occupational performance. Patient will benefit from skilled OT to address above impairments and improve overall function.  REHAB POTENTIAL: Fair given comorbidities  PLAN:  OT FREQUENCY: 2x/week  OT DURATION: 8 weeks  PLANNED INTERVENTIONS: 97168 OT Re-evaluation, 97535 self care/ADL training, 63875 therapeutic exercise, 97530 therapeutic activity, 97112 neuromuscular re-education, 97140 manual therapy, visual/perceptual remediation/compensation, coping strategies training, patient/family education, and DME and/or AE instructions  RECOMMENDED OTHER SERVICES: N/A for this visit  CONSULTED AND AGREED WITH PLAN OF CARE: Patient and family member/caregiver  PLAN FOR NEXT SESSION:  Review PRN - tremor reduction strategies, vision compensation and scanning activities, diplopia HEP, oculomotor saccades  Visual comp strategies/handout Continued progression of FM coordination HEP and/or activities, continue writing activities Add R theraband HEP  Wynetta Emery, OT 04/01/2024, 3:56 PM

## 2024-04-01 NOTE — Progress Notes (Signed)
 04/01/2024 Name: [name] MRN: [id number] DOB: 1976-06-16  Chief Complaint  Patient presents with   Hypertension   Medication Management    Seung Doris Mcgilvery is a 48 y.o. year old male who presented for a telephone visit. I spoke with pt's wife, Delaney Meigs, who is [name], civil (consulting) and manages pts medications.   They were referred to the pharmacist by their PCP for assistance in managing hypertension and hyperlipidemia.   Subjective:  Care Team: Primary Care Provider: [name], MD ; Next Scheduled Visit: none scheduled  Medication Access/Adherence  Current Pharmacy:  [id number], [place] - [address] AT [employer] [address] [place] [address] Phone: [phone] Fax: [phone]   Patient reports affordability concerns with their medications: No  Patient reports access/transportation concerns to their pharmacy: No  Patient reports adherence concerns with their medications:  No     Hypertension:  Current medications: carvedilol 12.5 mg BID, hydralazine 50 mg BID, terazosin 2 mg 2 caps qHS Medications previously tried: amlodipine (rash), lisinopril, losartan  Patient has a validated, automated, upper arm home BP cuff Current blood pressure readings readings: 130-160s/70-80s  **Of note pt went to ED last week due to headache and vision changes. She notes today he has not had more headaches but still having vision loss on left side. He has an ophthalmologist appt scheduled but not until early May.  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  Patient denies hypertensive symptoms including headache, chest pain, shortness of breath  Current meal patterns: Pt's wife does ensure he is watching sodium and caffeine intake   Hyperlipidemia/ASCVD Risk  Reduction  Current lipid lowering medications: rosuvastatin 20 mg daily   Antiplatelet regimen: aspirin 81 mg daily, completed Plavix  ASCVD History: CVA 08/2023   Objective: BP Readings from Last 3 Encounters:  03/25/24 (!) 196/89  03/25/24 (!) 186/80  03/18/24 (!) 151/70     Lab Results  Component Value Date   HGBA1C 5.8 09/12/2023    Lab Results  Component Value Date   CREATININE 2.34 (H) 03/25/2024   BUN 32 (H) 03/25/2024   NA 137 03/25/2024   K 4.9 03/25/2024   CL 106 03/25/2024   CO2 23 03/25/2024    Lab Results  Component Value Date   CHOL 173 07/20/2023   HDL 45.00 07/20/2023   LDLCALC 90 07/20/2023   LDLDIRECT 116 (H) 01/21/2011   TRIG 126 09/25/2023   CHOLHDL 4 07/20/2023    Medications Reviewed Today     Reviewed by Bonita Quin, RPH (Pharmacist) on 04/01/24 at 1131  Med List Status: <None>   Medication Order Taking? Sig Documenting Provider Last Dose Status Informant  acetaminophen (TYLENOL) 325 MG tablet [id number]  Take 1-2 tablets (325-650 mg total) by mouth every 4 (four) hours as needed for mild pain (pain score 1-3). Jerene Pitch  Active   aspirin EC 81 MG tablet [id number] Yes Take 1 tablet (81 mg total) by mouth daily. Swallow whole. Pokhrel, Rebekah Chesterfield, MD Taking Active   carvedilol (COREG) 12.5 MG tablet [id number] Yes Take 1 tablet (12.5 mg total) by mouth 2 (two) times daily with a meal. [name], MD Taking Active   cycloSPORINE modified (NEORAL) 100 MG capsule [id number] Yes Take 100 mg by mouth 2 (two) times daily. Taken  with 50 mg tablet [provider] Taking Active Spouse/Significant Other  cycloSPORINE modified (NEORAL) 50 MG capsule [id number] Yes Take 1 capsule by mouth two times daily, Take along with 100 mg twice a day  Taking Active   hydrALAZINE (APRESOLINE) 50 MG tablet [id number] Yes Take 1 tablet (50 mg total) by mouth 2 (two) times daily. Erick Colace, MD Taking Active   mycophenolate (MYFORTIC)  180 MG EC tablet [id number] Yes Take 1 tablet (180 mg total) by mouth 2 (two) times daily. [name], MD Taking Active   Oxycodone HCl 10 MG TABS [id number] Yes Take 0.5-1 tablets (5-10 mg total) by mouth 5 (five) times daily as needed,  Taking Active   pantoprazole (PROTONIX) 40 MG tablet [id number] Yes TAKE 1 TABLET(40 MG) BY MOUTH TWICE DAILY [name], MD Taking Active   predniSONE (DELTASONE) 5 MG tablet [id number] Yes Take 5 mg by mouth daily. [provider] Taking Active Spouse/Significant Other  rosuvastatin (CRESTOR) 20 MG tablet [id number] Yes Take 1 tablet (20 mg total) by mouth daily. [name], MD Taking Active   sulfamethoxazole-trimethoprim (BACTRIM) 400-80 MG tablet [id number] Yes Take 1 tablet by mouth every Monday, Wednesday, and Friday. [name], MD Taking Active   terazosin (HYTRIN) 2 MG capsule [id number] Yes Take 2 capsules (4 mg total) by mouth at bedtime. [name], MD Taking Active   Med List Note Danie Binder 03/14/13 2044): tue thu sat dialysis              Assessment/Plan:   Hypertension: - Currently uncontrolled, Bp goal <130/80 -  Recommended to check home blood pressure and heart rate  - Recommend to increase hydralazine to 50 mg TID  - Due to hx of renal transplant and current GFR 33, avoiding diuretics unless recommended by nephrologist    Hyperlipidemia/ASCVD Risk Reduction: - Currently uncontrolled. LDL goal <70 - Recommend to change rosuvastatin to atorvastatin due to no renal dose adjustment needed with atorvastatin  - Need updated lipid panel   Follow Up Plan: send BP readings via MyChart in 2 weeks  Arbutus Leas, PharmD, BCPS, CPP Clinical Pharmacist Practitioner [facility] [phone]

## 2024-04-01 NOTE — Patient Instructions (Signed)
 It was a pleasure speaking with you today!  I have sent the increased hydralazine Rx and atorvastatin (to replace rosuvastatin) to [facility] on [address]    Continue monitoring BP (wait at leat 2 hours after taking meds to check) daily. Reply to directly to this message in order to send some BP readings to me in a couple weeks.   You are overdue for follow up with Dr. Yetta Barre. I recommend scheduling on MyChart or call the front desk to schedule a follow up with him at your earliest convenience.  Feel free to call with any questions or concerns!  Arbutus Leas, PharmD, BCPS, CPP Clinical Pharmacist Practitioner [facility] [phone]

## 2024-04-02 ENCOUNTER — Other Ambulatory Visit: Payer: Self-pay

## 2024-04-03 ENCOUNTER — Other Ambulatory Visit: Payer: Self-pay

## 2024-04-04 ENCOUNTER — Ambulatory Visit: Admitting: Speech Pathology

## 2024-04-04 ENCOUNTER — Ambulatory Visit: Admitting: Occupational Therapy

## 2024-04-04 ENCOUNTER — Telehealth: Payer: Self-pay | Admitting: Occupational Therapy

## 2024-04-04 ENCOUNTER — Ambulatory Visit

## 2024-04-04 VITALS — BP 150/73 | HR 66

## 2024-04-04 VITALS — BP 126/57 | HR 68

## 2024-04-04 DIAGNOSIS — I639 Cerebral infarction, unspecified: Secondary | ICD-10-CM

## 2024-04-04 DIAGNOSIS — R2689 Other abnormalities of gait and mobility: Secondary | ICD-10-CM

## 2024-04-04 DIAGNOSIS — R4701 Aphasia: Secondary | ICD-10-CM | POA: Diagnosis not present

## 2024-04-04 DIAGNOSIS — R278 Other lack of coordination: Secondary | ICD-10-CM

## 2024-04-04 DIAGNOSIS — R29818 Other symptoms and signs involving the nervous system: Secondary | ICD-10-CM | POA: Diagnosis not present

## 2024-04-04 DIAGNOSIS — R2681 Unsteadiness on feet: Secondary | ICD-10-CM | POA: Diagnosis not present

## 2024-04-04 DIAGNOSIS — M6281 Muscle weakness (generalized): Secondary | ICD-10-CM

## 2024-04-04 DIAGNOSIS — R41841 Cognitive communication deficit: Secondary | ICD-10-CM

## 2024-04-04 NOTE — Therapy (Signed)
 OUTPATIENT PHYSICAL THERAPY NEURO TREATMENT   Patient Name: [name] MRN: [id number] DOB:07/02/1976, 48 y.o., male Today's Date: 04/04/2024   PCP: [name], MD REFERRING PROVIDER: [name], MD  END OF SESSION:  PT End of Session - 04/04/24 1322     Visit Number 7    Number of Visits 17    Date for PT Re-Evaluation 05/01/24    Authorization Type [employer] Dual complete    PT Start Time 1447   received from OT   PT Stop Time 1526    PT Time Calculation (min) 39 min    Equipment Utilized During Treatment Gait belt    Activity Tolerance Patient tolerated treatment well    Behavior During Therapy WFL for tasks assessed/performed               Past Medical History:  Diagnosis Date   AMPUTATION, BELOW KNEE, 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   Diabetes mellitus without complication [facility])    no since pancreas transplant   Dialysis patient [facility]) 04/18/2012   "[facility]; [place], [place], [place]"   Gastroparesis    Gastropathy    GERD (gastroesophageal reflux disease)    Hypertension    MRSA infection    over 10 years ago per patient. in legs   Past Surgical History:  Procedure Laterality Date   AV FISTULA PLACEMENT  08/2011   left upper arm   BELOW KNEE LEG AMPUTATION  "it's been awhile"   bilaterally   CATARACT EXTRACTION  ~ 2011   right   COMBINED KIDNEY-PANCREAS TRANSPLANT  2014   ESOPHAGOGASTRODUODENOSCOPY N/A 12/30/2016   Procedure: ESOPHAGOGASTRODUODENOSCOPY (EGD);  Surgeon: Jeani Hawking, MD;  Location: [facility] ENDOSCOPY;  Service: Endoscopy;  Laterality: N/A;   OLECRANON BURSECTOMY Right 06/23/2020   Procedure: RIGHT ELBOW EXCISION OLECRANON BURSITIS;  Surgeon: Nadara Mustard, MD;  Location: [facility];  Service: Orthopedics;  Laterality: Right;   Patient Active  Problem List   Diagnosis Date Noted   Mass of right side of neck 02/20/2024   Ischemic cerebrovascular accident (CVA) due to global hypoperfusion with watershed infarction [name]) 09/20/2023   Hypertension secondary to other renal disorders 09/18/2023   PAD (peripheral artery disease) (HCC) 09/04/2023   Diabetes mellitus type II, controlled (HCC) 09/04/2023   Abnormal electrocardiogram (ECG) (EKG) 07/20/2023   Chronic pain 07/04/2023   Tobacco dependence 07/04/2023   Marijuana dependence (HCC) 07/04/2023   Screen for colon cancer 01/10/2022   Balanitis 01/10/2022   Encounter for general adult medical examination with abnormal findings 01/10/2022   Screening for prostate cancer 01/10/2022   Healthcare maintenance 08/26/2016   Impaired mobility and activities of daily living 07/03/2014   Renal transplant, status post 07/19/2013   History of pancreas transplant (HCC) 07/19/2013   Immunosuppressed status (HCC) 06/27/2013   H/O pancreas transplant (HCC) 06/27/2013   Diabetic gastroparesis associated with type 1 diabetes mellitus (HCC) 05/08/2013   S/P bilateral BKA (below knee amputation) (HCC) 08/17/2012   Metabolic bone disease 07/20/2011   ESRD (end stage renal disease) (HCC) 11/17/2009   GERD 03/14/2007   Dyslipidemia, goal LDL below 100 02/22/2007   Major depressive disorder, recurrent episode (HCC) 02/22/2007   POST TRAUMATIC STRESS DISORDER 02/22/2007   IMPOTENCE,  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    ONSET DATE: 02/10/2024 (referral)   REFERRING DIAG: [phone] (ICD-10-CM) - S/P bilateral BKA (below knee amputation) (HCC) Z74.09,Z78.9 (ICD-10-CM) - Impaired mobility and activities of daily living I63.9 (ICD-10-CM) - Ischemic cerebrovascular accident (CVA) due to global hypoperfusion with watershed infarction [facility])  THERAPY DIAG:  Muscle weakness (generalized)  Other Caldwell of coordination  Unsteadiness on feet  Other abnormalities of gait and mobility  Rationale for Evaluation and  Treatment: Rehabilitation  SUBJECTIVE:                                                                                                                                                                                             SUBJECTIVE STATEMENT: Patient received from OT. Reports worsening L eye vision- no changes in R eye. Wife is trying to move eye dr appt up. Is walking a little more at home without assistance. Denies falls.    Pt accompanied by: self  PERTINENT HISTORY: PAD, HTN, Strokes, s/p Kidney and Pancreas Transplant, Carotid Stenosis, T1DM, ESRD, bilateral BKA amputee   PAIN: "I am always in pain" Are you having pain? Yes: NPRS scale: 6/10 Pain location: B LE Pain description: soreness   PRECAUTIONS: Fall  PATIENT GOALS: " To get my walk as close as I can to where I was"   OBJECTIVE:  Note: Objective measures were completed at Evaluation unless otherwise noted.  DIAGNOSTIC FINDINGS: CT of head from 10/11/23  IMPRESSION: Evolving left MCA territory infarcts involving the left frontal and parietal lobes, better seen on recent prior brain MRI. Redemonstrated petechial hemorrhage along the posterior left frontal lobe.  MRI of head from 09/18/23 IMPRESSION: 1. Scattered small foci of acute subcortical infarction in the left occipital lobe and posterior left frontal lobe. 2. Faint diffusion signal abnormality along the left parietal lobe may reflect evolving infarct versus seizure related cytotoxic edema. 3. 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  VITALS  Vitals:   04/04/24 1450 04/04/24 1503  BP: (!) 172/76 (!) 126/57  Pulse: 68  TREATMENT:   NMR -scifit hills level 3 B UE/LE 2x5 mins with BP check partway through for large amplitude reciprocal coordination -standing hip abd with 4# ankle weights, B UE  support on ballet bar  -lateral weight shift, no UE transferring squigz  GAIT: -43ft x2 no AD + MinA/MOdA   -significant trendelenburg with multimodal cues for increased R foot step height, length and heel contact  PATIENT EDUCATION: Education details: Continue HEP, monitor vision changes Person educated: Patient Education method: Explanation Education comprehension: verbalized understanding and needs further education  HOME EXERCISE PROGRAM: Access Code: [email or web address] URL: [email or web address] Date: 03/04/2024 Prepared by: Merry Lofty  Exercises - Standing Hip Abduction with Counter Support  - 1 x daily - 7 x weekly - 3 sets - 10 reps - Standing March with Counter Support  - 1 x daily - 7 x weekly - 3 sets - 10 reps - Standing Hip Extension with Counter Support  - 1 x daily - 7 x weekly - 3 sets - 10 reps - Mini Squat with Counter Support  - 1 x daily - 7 x weekly - 3 sets - 10 reps - Sit to Stand with Counter Support  - 1 x daily - 7 x weekly - 3 sets - 5 reps -seated march over  - Lower Trunk Rotation Stretch  - 1 x daily - 7 x weekly - 3 sets - 10 reps - Single Leg Bridge  - 1 x daily - 7 x weekly - 1-2 sets - 6-10 reps  GOALS: Goals reviewed with patient? Yes  SHORT TERM GOALS: Target date: 03/27/2024   Pt will perform floor transfer w/min A for improved fall recovery and safety at home  Baseline: Goal status: INITIAL  2.  Pt will improve 5 x STS to less than or equal to 21 seconds w/SBA to demonstrate improved functional strength and transfer efficiency.   Baseline: 26.5s  Goal status: INITIAL  3.  Pt will improve gait velocity to at least 1.2 ft/s w/LRAD and CGA for improved gait efficiency and independence  Baseline: 0.8 ft/s w/SBQC and CGA Goal status: INITIAL  4.  Pt will ascend/descend 12 steps w/single rail and CGA for improved safety in home environment  Baseline: Min A w/bilateral rails  Goal status: INITIAL   LONG TERM GOALS: Target  date: 04/24/2024   Pt will perform floor transfer w/CGA for fall recovery and safety  Baseline:  Goal status: INITIAL  2.  Pt will improve 5 x STS to less than or equal to 18 seconds mod I to demonstrate improved functional strength and transfer efficiency.  Baseline: 26.5s  Goal status: INITIAL  3.  Pt will improve gait velocity to at least 1.5 ft/s w/LRAD and SBA for improved gait efficiency and reduced fall risk   Baseline: 0.8 ft/s w/SBQC and CGA Goal status: INITIAL  4.  Pt will navigate 12 stairs w/single rail and SBA for improved safety in home environment.  Baseline: min A w/bilateral rails  Goal status: INITIAL   ASSESSMENT:  CLINICAL IMPRESSION: Patient seen for skilled PT session with emphasis on gross NMR and gait tx without an AD. He demonstrates limited endurance and poor B hip strength, R >L, resulting in a rather significant trendelenburg gait pattern. Of note, patient does report worsening of L eye vision and visual field cut. When retrieved from OT, patient was unable to spot this PT standing across the gym, but 15* off midline to his R. He was able to track another  therapist traveling from his L to R to ultimately find this PT, but unable to initially spot stationary therapist who was waving. Continue POC.    OBJECTIVE IMPAIRMENTS: Abnormal gait, decreased activity tolerance, decreased balance, decreased cognition, decreased endurance, decreased knowledge of condition, decreased knowledge of use of DME, difficulty walking, decreased strength, decreased safety awareness, impaired perceived functional ability, impaired sensation, improper body mechanics, prosthetic dependency , and pain  ACTIVITY LIMITATIONS: carrying, lifting, bending, standing, squatting, stairs, transfers, bathing, dressing, hygiene/grooming, locomotion level, and caring for others  PARTICIPATION LIMITATIONS: meal prep, cleaning, laundry, medication management, interpersonal relationship, driving,  shopping, community activity, and yard work  PERSONAL FACTORS: Fitness, Past/current experiences, and 1-2 comorbidities: bilateral BKA, CVA  are also affecting patient's functional outcome.   REHAB POTENTIAL: Good  CLINICAL DECISION MAKING: Evolving/moderate complexity  EVALUATION COMPLEXITY: Moderate  PLAN:  PT FREQUENCY: 2x/week  PT DURATION: 8 weeks  PLANNED INTERVENTIONS: 97164- PT Re-evaluation, 97110-Therapeutic exercises, 97530- Therapeutic activity, [id number]- Neuromuscular re-education, 97535- Self Care, [id number]- Manual therapy, [id number]- Gait training, [phone]- Prosthetic training, [phone]- Electrical stimulation (manual), Patient/Family education, Balance training, Stair training, Dry Needling, and DME instructions  PLAN FOR NEXT SESSION: Monitor vitals. Stairs, floor transfers, hip strengthening, hamstring strengthening, progressing gait with SBQC vs no AD, tall kneel, side stepping, GOAL CHECK, updates on vision/ eye dr?   Westley Foots, PT Westley Foots, PT, DPT, CBIS 04/04/2024, 4:00 PM

## 2024-04-04 NOTE — Therapy (Signed)
 OUTPATIENT OCCUPATIONAL THERAPY NEURO TREATMENT  Patient Name: [name] MRN: [id number] DOB:12-06-1976, 48 y.o., male Today's Date: 04/04/2024  PCP: Andrew Linger, MD REFERRING PROVIDER: [name], MD  END OF SESSION:  OT End of Session - 04/04/24 1554     Visit Number 8    Number of Visits 17    Date for OT Re-Evaluation 04/26/24    Authorization Type UHC - Medicare    OT Start Time 1402    OT Stop Time 1445    OT Time Calculation (min) 43 min    Activity Tolerance Patient tolerated treatment well    Behavior During Therapy WFL for tasks assessed/performed               Past Medical History:  Diagnosis Date   AMPUTATION, BELOW KNEE, 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   Diabetes mellitus without complication [facility])    no since pancreas transplant   Dialysis patient [facility]) 04/18/2012   "[facility]; [place], [place], [place]"   Gastroparesis    Gastropathy    GERD (gastroesophageal reflux disease)    Hypertension    MRSA infection    over 10 years ago per patient. in legs   Past Surgical History:  Procedure Laterality Date   AV FISTULA PLACEMENT  08/2011   left upper arm   BELOW KNEE LEG AMPUTATION  "it's been awhile"   bilaterally   CATARACT EXTRACTION  ~ 2011   right   COMBINED KIDNEY-PANCREAS TRANSPLANT  2014   ESOPHAGOGASTRODUODENOSCOPY N/A 12/30/2016   Procedure: ESOPHAGOGASTRODUODENOSCOPY (EGD);  Surgeon: Andrew Hawking, MD;  Location: [facility] ENDOSCOPY;  Service: Endoscopy;  Laterality: N/A;   OLECRANON BURSECTOMY Right 06/23/2020   Procedure: RIGHT ELBOW EXCISION OLECRANON BURSITIS;  Surgeon: Andrew Mustard, MD;  Location: [facility];  Service: Orthopedics;  Laterality: Right;   Patient Active Problem List   Diagnosis Date Noted   Mass of right side of neck  02/20/2024   Ischemic cerebrovascular accident (CVA) due to global hypoperfusion with watershed infarction [facility]) 09/20/2023   Hypertension secondary to other renal disorders 09/18/2023   PAD (peripheral artery disease) (HCC) 09/04/2023   Diabetes mellitus type II, controlled (HCC) 09/04/2023   Abnormal electrocardiogram (ECG) (EKG) 07/20/2023   Chronic pain 07/04/2023   Tobacco dependence 07/04/2023   Marijuana dependence (HCC) 07/04/2023   Screen for colon cancer 01/10/2022   Balanitis 01/10/2022   Encounter for general adult medical examination with abnormal findings 01/10/2022   Screening for prostate cancer 01/10/2022   Healthcare maintenance 08/26/2016   Impaired mobility and activities of daily living 07/03/2014   Renal transplant, status post 07/19/2013   History of pancreas transplant (HCC) 07/19/2013   Immunosuppressed status (HCC) 06/27/2013   H/O pancreas transplant (HCC) 06/27/2013   Diabetic gastroparesis associated with type 1 diabetes mellitus (HCC) 05/08/2013   S/P bilateral BKA (below knee amputation) (HCC) 08/17/2012   Metabolic bone disease 07/20/2011   ESRD (end stage renal disease) (HCC) 11/17/2009   GERD 03/14/2007   Dyslipidemia, goal LDL below 100 02/22/2007   Major depressive disorder, recurrent episode (HCC) 02/22/2007   POST TRAUMATIC STRESS DISORDER 02/22/2007   IMPOTENCE, 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    ONSET DATE: 02/10/2024 (Date of referral)  REFERRING DIAG: Z89.512,Z89.511 (ICD-10-CM) -  S/P bilateral BKA (below knee amputation) (HCC) Z74.09,Z78.9 (ICD-10-CM) - Impaired mobility and activities of daily living I63.9 (ICD-10-CM) - Ischemic cerebrovascular accident (CVA) due to global hypoperfusion with watershed infarction  THERAPY DIAG:  Muscle weakness (generalized)  Other lack of coordination  Other symptoms and signs involving the nervous system  Acute CVA (cerebrovascular accident) (HCC)  Cognitive communication deficit  Ischemic cerebrovascular  accident (CVA) due to global hypoperfusion with watershed infarction [facility])  Rationale for Evaluation and Treatment: Rehabilitation  SUBJECTIVE:   SUBJECTIVE STATEMENT: From eval: 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   Pt reported feeling "alright" today. Pt reported ongoing and evolving difficulty with eyesight on L side. Pt reported "I can't see anything out of my [left] eye" and pt feels it's getting worse. Pt reported able to see general shapes with L eye with R eye is occluded. Pt reported appointment with eye doctor scheduled for late May 2025. OT advised pt to f/u with eye doctor, PCP, or neurology sooner if possible d/t worsening vision. Pt acknowledged understanding. Pt reported no recent falls and no changes in medication. Pt reported vision seemed to get worse over past 3 days.   Pt accompanied by: self  PERTINENT HISTORY:  PAD, HTN, Strokes, s/p Kidney and Pancreas Transplant, Carotid Stenosis, T1DM, ESRD, bilateral BKA amputee   PRECAUTIONS: None  WEIGHT BEARING RESTRICTIONS: No  PAIN:  Are you having pain? 5/10 RLE > LLE  FALLS: Has patient fallen in last 6 months? Yes. Number of falls 1  LIVING ENVIRONMENT: Lives with: lives with their spouse Lives in: House/apartment Stairs: Yes: Internal: 15 steps; on right going up and External: 5 steps; bilateral but cannot reach both Has following equipment at home: Quad cane small base, Walker - 2 wheeled, Wheelchair (manual), and shower chair  PLOF: Independent; was driving;   PATIENT GOALS: get back to PLOF  OBJECTIVE:  Note: Objective measures were completed at Evaluation unless otherwise noted.  HAND DOMINANCE: Right  ADLs: Overall ADLs: mod I   IADLs: Light housekeeping: sweeping, gathers up laundry, dishes  Meal Prep: mod I Community mobility: dependent Medication management: setup Handwriting: 50% legible, Increased time, and due to cognitive impairments  MOBILITY STATUS: Independent - manual wheelchair  ACTIVITY  TOLERANCE: Activity tolerance: fair  FUNCTIONAL OUTCOME MEASURES:  03/04/24 - PSFS - 3.3   Total score = sum of the activity scores/number of activities Minimum detectable change (90%CI) for average score = 2 points Minimum detectable change (90%CI) for single activity score = 3 points  04/01/24 - 8.3    UPPER EXTREMITY ROM:  Impaired R supination ~ neutral though able to improve to [facility] with increased time and awareness.     BUE - WNL though weaker on R vs L  HAND FUNCTION: Grip strength: Right: 81.5 lbs; Left: 97.8 lbs  COORDINATION: 9 Hole Peg test: Right: 47 sec; Left: 33 sec - mild tremor in R hand  SENSATION: WFL  EDEMA: none reported or observed  MUSCLE TONE:WFL  COGNITION: Overall cognitive status: Impaired - see ST for further details  VISION: Subjective report: Pt reports vision fluctuates including episodes of double vision and L visual field cut Baseline vision: Bifocals, Wears glasses all the time, and ST bifocals Visual history: cataracts and corrective eye surgery  VISION ASSESSMENT: WFL distance and near acuity; ~45* L visual field  PERCEPTION: Impaired: secondary to visual impairments; pt required cues to locate hand sanitizer on table, reaching to Kleenex originally  PRAXIS: Impaired: Motor planning and potential ideational praxis  OBSERVATIONS: Pt appears well-kept. Glasses hanging on shirt. Pt able to self-propel manual wheelchair.                                                                                                                            TREATMENT :  Self-Care Vital signs assessed, see vital signs: BP 150/73. Pt denied symptoms of dizziness, lightheadedness, headache, blurry vision in R eye. BP within therapeutic parameters. Pt reported continued L vision changes. D/t concerns with worsening vision changes, OT advised pt to call ophthalmologist, PCP, or neurology to schedule an appointment soon. Pt verbalized understanding. Pt  agreeable to OT contacting pt's doctors and pt's spouse because pt's spouse helps manage pt's appointments.  TherAct OT reviewed vision compensation strategies. Pt acknowledged understanding.  Search-and-find Animals Book - visual scanning activity - to improve visual scanning, to practice visual scanning strategies, to improve attention to impaired L side. Pt identified 100% of items with extra time.   Placing grooved pegs in grooved pegboard with RUE, removing pegs with tweezers using RUE - to improve FM coordination and dexterity of RUE, to improve in-hand manipulation.  Flex bar (tan) - supination, pronation, twist - 10 reps, 2 sets - to improve BUE gross motor coordination, strengthening.  Neuro Re-Ed OT initiated RUE theraband HEP - red theraband - to improve gross motor coordination and strengthening of RUE, to improve proprioception and attention to upright seated posture. Pt returned demo with mod to max v/c. Access Code: [email or web address] URL: [email or web address] Date: 04/04/2024 Prepared by: Carilyn Goodpasture  Exercises - Seated Elbow Flexion with Self-Anchored Resistance  - 2 x daily - 1 sets - 10 reps - Seated Elbow Extension with Self-Anchored Resistance  - 2 x daily - 1 sets - 10 reps - Seated Bilateral Shoulder External Rotation with Resistance  - 2 x daily - 1 sets - 10 reps - Seated Shoulder Flexion with Self-Anchored Resistance  - 2 x daily - 1 sets - 10 reps   Note on vision concerns:  Near end of session, pt was transitioning to PT session in w/c. Pt demo'd difficulty locating PT in busy clinic environment despite PT waving to pt. PT was located approx. 15* to pt's R side from midline. Pt required extra time and mod to max v/c to locate PT in busy clinic environment despite pt demo'ing visual scanning strategies.   PATIENT EDUCATION: Education details: see today's tx above Person educated: Patient Education method: Explanation, Demonstration, and Verbal  cues Education comprehension: verbalized understanding, returned demonstration, verbal cues required, and needs further education  HOME EXERCISE PROGRAM: 03/04/24 - tremor compensation strategies 03/11/2024: vision strategies and visual scanning 03/14/24 - vision HEP (see pt instructions) 04/04/24 - red theraband RUE - Access Code: [email or web address]  GOALS:  SHORT TERM GOALS: Target date: 03/27/2024   Patient will demonstrate initial RUE HEP with 25% verbal cues or less for proper execution. 04/01/24 - Pt benefited from v/c.  Goal status: IN Progress  2.  Patient will independently recall at least 2 compensatory strategies for visual impairment without cueing.  04/01/24 - With min v/c, pt recalled turning head to look and looking for the edges of objects.  04/04/24 - Pt ind recalled using systematic scanning method and turning head to look. Goal status: MET   3.  Patient will demonstrate at least 85% accuracy with environmental visual scanning to assist with ADLs and IADLS including potential driving considerations.  04/01/24 - Pt completed visual scanning task in clinic with 80% accuracy while self-propelling w/c. 04/04/24 - Pt identified 100% of items in Search and Find visual scanning activity book with extra time, including items on L side. Goal status: IN Progress  4.  Pt will independently recall at least 2 tremor reduction strategies.   Baseline:  03/04/24 - OT educated pt on tremor compensation strategies. Pt returned demo. 04/01/24 - Pt ind recalled using weighted pen and stabilize arms on table. Goal status: MET  LONG TERM GOALS: Target date: 04/26/2024  Patient will demonstrate updated RUE HEP with visual handouts only for proper execution.  Goal status: IN Progress  2.  03/04/24 revised - Patient will report at least two-point increase in average PSFS score or at least three-point increase in a single activity score indicating functionally significant improvement given minimum detectable  change. Baseline: PSFS - 3.3 total score (See above for individual activity scores)  04/01/24 - 8.3 Goal status: MET  3.  Patient will demo improved FM coordination as evidenced by completing nine-hole peg with use of R in 35 seconds or less. Baseline: Right: 47 secs 04/01/24 - Trial 1 - Right: 57 seconds, Trial 2 - Right: 38 seconds Goal status: IN Progress  4.  Patient will demonstrate at least 88 lbs R grip strength as needed to open jars and other containers. Baseline: Right: 81.5 lbs 04/01/24 - Right: 85.5, 81.1, 73.6 (80.06 lbs average) Goal status: IN Progress  ASSESSMENT:  CLINICAL IMPRESSION: Pt met 1 STG. Pt demo'ing good carryover of visual scanning and compensation strategies. Pt c/o worsening L vision changes and OT strongly recommended to f/u with ophthalmologist, PCP, and/or neurology to address concerns. Pt verbalized understanding. Patient demonstrates good tolerance with completion of tasks this visit as needed to progress towards goals though OT to continue to monitor ongoing/evolving L visual field vision concerns. PERFORMANCE DEFICITS: in functional skills including ADLs, IADLs, coordination, proprioception, sensation, strength, Fine motor control, decreased knowledge of precautions, decreased knowledge of use of DME, vision, and UE functional use, cognitive skills including attention, memory, and problem solving, and psychosocial skills including coping strategies.   IMPAIRMENTS: are limiting patient from ADLs, IADLs, rest and sleep, leisure, and social participation.   CO-MORBIDITIES: may have co-morbidities  that affects occupational performance. Patient will benefit from skilled OT to address above impairments and improve overall function.  REHAB POTENTIAL: Fair given comorbidities  PLAN:  OT FREQUENCY: 2x/week  OT DURATION: 8 weeks  PLANNED INTERVENTIONS: 97168 OT Re-evaluation, 97535 self care/ADL training, [address] therapeutic exercise, 97530 therapeutic activity,  97112 neuromuscular re-education, 97140 manual therapy, visual/perceptual remediation/compensation, coping strategies training, patient/family education, and DME and/or AE instructions  RECOMMENDED OTHER SERVICES: N/A for this visit  CONSULTED AND AGREED WITH PLAN OF CARE: Patient and family member/caregiver  PLAN FOR NEXT SESSION:  Review PRN - tremor reduction strategies, vision compensation and scanning activities, diplopia HEP, oculomotor saccades  Visual comp strategies/handout Continued progression of FM coordination HEP and/or activities, continue writing activities Review R theraband HEP PRN Did pt f/u  with neurology/ophthalmology/PCP?  Wynetta Emery, OT 04/04/2024, 4:17 PM

## 2024-04-04 NOTE — Telephone Encounter (Signed)
 Good afternoon,  Gable Odonohue was seen today for PT/OT sessions. Pt reported worsening vision in L eye. Per pt report today: "I can't see anything out of my [left] eye" and pt feels it's getting worse, especially over past 3 days. Pt reported able to see general shapes with L eye with R eye is occluded. Pt reported no recent falls and no changes in medication.    Of note, pt demo'd difficulty locating the PT in a busy clinic environment today when transitioning to PT session today despite cueing and pt using visual scanning strategies.   Pt reported upcoming appointment with eye doctor scheduled for late May 2025. However, pt would likely benefit from f/u sooner d/t worsening vision.  We recommended to pt to reach out to neurology, ophthalmology, and/or PCP to address worsening vision concerns. Please f/u as you see fit.   Thank you, Carilyn Goodpasture, OTR/L  [facility] [address] [place], [place]  [address] Phone:  [phone] Fax:  [phone]

## 2024-04-04 NOTE — Therapy (Signed)
 OUTPATIENT SPEECH LANGUAGE PATHOLOGY APHASIA TREATMENT   Patient Name: [name] MRN: [id number] DOB:03-21-1976, 48 y.o., male Today's Date: 04/04/2024  PCP: [name], MD REFERRING PROVIDER: [name], MD  END OF SESSION:  End of Session - 04/04/24 1316     Visit Number 2    Number of Visits 25    Date for SLP Re-Evaluation 05/22/24    SLP Start Time 1315    SLP Stop Time  1400    SLP Time Calculation (min) 45 min    Activity Tolerance Patient tolerated treatment well              Past Medical History:  Diagnosis Date   AMPUTATION, BELOW KNEE, 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   Diabetes mellitus without complication [facility])    no since pancreas transplant   Dialysis patient [facility]) 04/18/2012   "[facility]; [place], [place], Sat"   Gastroparesis    Gastropathy    GERD (gastroesophageal reflux disease)    Hypertension    MRSA infection    over 10 years ago per patient. in legs   Past Surgical History:  Procedure Laterality Date   AV FISTULA PLACEMENT  08/2011   left upper arm   BELOW KNEE LEG AMPUTATION  "it's been awhile"   bilaterally   CATARACT EXTRACTION  ~ 2011   right   COMBINED KIDNEY-PANCREAS TRANSPLANT  2014   ESOPHAGOGASTRODUODENOSCOPY N/A 12/30/2016   Procedure: ESOPHAGOGASTRODUODENOSCOPY (EGD);  Surgeon: Jeani Hawking, MD;  Location: [facility] ENDOSCOPY;  Service: Endoscopy;  Laterality: N/A;   OLECRANON BURSECTOMY Right 06/23/2020   Procedure: RIGHT ELBOW EXCISION OLECRANON BURSITIS;  Surgeon: Nadara Mustard, MD;  Location: [facility];  Service: Orthopedics;  Laterality: Right;   Patient Active Problem List   Diagnosis Date Noted   Mass of right side of neck 02/20/2024   Ischemic cerebrovascular accident (CVA) due to global hypoperfusion with  watershed infarction [facility]) 09/20/2023   Hypertension secondary to other renal disorders 09/18/2023   PAD (peripheral artery disease) (HCC) 09/04/2023   Diabetes mellitus type II, controlled (HCC) 09/04/2023   Abnormal electrocardiogram (ECG) (EKG) 07/20/2023   Chronic pain 07/04/2023   Tobacco dependence 07/04/2023   Marijuana dependence (HCC) 07/04/2023   Screen for colon cancer 01/10/2022   Balanitis 01/10/2022   Encounter for general adult medical examination with abnormal findings 01/10/2022   Screening for prostate cancer 01/10/2022   Healthcare maintenance 08/26/2016   Impaired mobility and activities of daily living 07/03/2014   Renal transplant, status post 07/19/2013   History of pancreas transplant (HCC) 07/19/2013   Immunosuppressed status (HCC) 06/27/2013   H/O pancreas transplant (HCC) 06/27/2013   Diabetic gastroparesis associated with type 1 diabetes mellitus (HCC) 05/08/2013   S/P bilateral BKA (below knee amputation) (HCC) 08/17/2012   Metabolic bone disease 07/20/2011   ESRD (end stage renal disease) (HCC) 11/17/2009   GERD 03/14/2007   Dyslipidemia, goal LDL below 100 02/22/2007   Major depressive disorder, recurrent episode (HCC) 02/22/2007   POST TRAUMATIC STRESS DISORDER 02/22/2007   IMPOTENCE, 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    ONSET DATE: 09/17/24   REFERRING DIAG: W09.811,B14.782 (ICD-10-CM) - S/P bilateral BKA (below knee amputation) (HCC) Z74.09,Z78.9 (ICD-10-CM) - Impaired mobility and activities of daily living  I63.9 (ICD-10-CM) - Ischemic cerebrovascular accident (CVA) due to global hypoperfusion with watershed infarction [facility])  THERAPY DIAG:  Cognitive communication deficit  Aphasia  Rationale for Evaluation and Treatment: Rehabilitation  SUBJECTIVE:   SUBJECTIVE STATEMENT: Pt reports improvement with communication, denies swallowing deficits  Pt accompanied by: significant other [name]  PERTINENT HISTORY: Patient is a 48 yo male who presented to ED 9/23  with AMS. Pt found to be aphasic with a facial droop and a code stroke was activated. Intubated 9/23-9/24. MRI Brain with scattered small foci of acute subcortical infarction in L occipital lobe and posterior L frontal lobe and occluded L ICA. Recently admitted 9/9-9/11 with findings of E coli UTI and poorly controlled HTN. 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 Therapy evaluations completed with recommendations for CIR. Patient admitted 09/29/23. D/C from CIR11/1/24 with [facility] ST/PT/OT.   PAIN:  Are you having pain? Pt denies pain   FALLS: Has patient fallen in last 6 months?  See PT evaluation for details  LIVING ENVIRONMENT: Lives with: lives with their spouse Lives in: House/apartment  PLOF:  Level of assistance: Independent with ADLs, Independent with IADLs Employment: On disability  PATIENT GOALS: "To make my speech better"  OBJECTIVE:  Note: Objective measures were completed at Evaluation unless otherwise noted.  DYSPHAGIA EVALUATION: SUBJECTIVE DYSPHAGIA REPORTS: Reported symptoms: globus sensation  Current diet: regular and thin liquids  Co-morbid voice changes: No  FACTORS WHICH MAY INCREASE RISK OF ADVERSE EVENT IN PRESENCE OF ASPIRATION:  General health: well appearing  Risk factors: none evident     ORAL MOTOR EXAMINATION: Overall status: WFL  CLINICAL SWALLOW ASSESSMENT:   Dentition: adequate natural dentition Vocal quality at baseline: normal Patient directly observed with POs: Yes:  3 oz water challenge, thin liquids   Feeding: able to feed self Liquids provided by: cup [facility] Swallow Protocol: Pass Oral phase signs and symptoms: none Pharyngeal phase signs and symptoms: none                                                                                                                    TREATMENT DATE:   04/04/24: swallow eval completed, see above.   Addressed anomia via  direct patient instruction for anomia strategies. Led pt through structured language task generating salient features of target words to build lexical connections and enhance understanding re: description anomia strategy. Pt successfully described x2 targets with min-A questioning cues for expanding lexicon.   02/28/24: Eval only  PATIENT EDUCATION: Education details: compensations for aphasia; compensations for cognition, See Treatment, See Patient Instructions Person educated: Patient and Spouse Education method: Explanation, Verbal cues, and Handouts Education comprehension: verbal cues required and needs further education   GOALS: Goals reviewed with patient? Yes  SHORT TERM GOALS: Target date: 03/27/24  Pt will name 15 items in personally relevant category Baseline: Goal status: INITIAL  2.  Pt will employ verbal compensations for aphasia in structured naming task 3/5 opportunities  with rare min A Baseline:  Goal status: INITIAL  3.  Pt will generate moderately complex sentences in structured language task with occasional min A Baseline:  Goal status: INITIAL  4.  Pt will write a phrase level with occasional min to mod A Baseline:  Goal status: INITIAL  5.  Pt will use compensations for memory to manage am meds with rare min A from spouse Baseline:  Goal status: INITIAL  6.  Pt will use verbal compensations for aphasia during simple conversation 2/4 opportunities with usual mod A Baseline:  Goal status: INITIAL  LONG TERM GOALS: Target date: 05/22/24  Pt will use verbal compensations for aphasia during moderately complex conversation 4/6 opportunities with occasional min A Baseline:  Goal status: INITIAL  2.  Pt will write at sentence level, ID and self correct errors with occasional min A Baseline:  Goal status: INITIAL  3.  Pt will send 3 simple texts with rare min A Baseline:  Goal status: INITIAL  4.  Pt will use compensations for aphasia in detailed or  persuasive narrative 4/6 opportunities with occasional min A Baseline:  Goal status: INITIAL  5.  Pt will improve score on Communicative Participation Item Bank Baseline: 15 Goal status: INITIAL   ASSESSMENT:  CLINICAL IMPRESSION: Patient is a 48 y.o. 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 He and his spouse, [name], report difficulty with word finding, semantic paraphasias and difficulty getting words out quickly. Johnathan endorses frustration with communication difficulties as does [name]. [name] is managing his medications at this time. I recommend skilled ST to maximize communication for safety, independence and to reduce caregiver burden.   OBJECTIVE IMPAIRMENTS: include attention, memory, aphasia, and apraxia. These impairments are limiting patient from managing medications, managing appointments, and effectively communicating at home and in community. Factors affecting potential to achieve goals and functional outcome are co-morbidities. Patient will benefit from skilled SLP services to address above impairments and improve overall function.  REHAB POTENTIAL: Good  PLAN:  SLP FREQUENCY: 2x/week  SLP DURATION: 12 weeks  PLANNED INTERVENTIONS: Language facilitation, Environmental controls, Cueing hierachy, Cognitive reorganization, Internal/external aids, Functional tasks, Multimodal communication approach, and SLP instruction and feedback    [employer], Student-SLP 04/04/2024, 1:16 PM

## 2024-04-08 ENCOUNTER — Ambulatory Visit: Admitting: Occupational Therapy

## 2024-04-08 ENCOUNTER — Ambulatory Visit: Admitting: Speech Pathology

## 2024-04-08 ENCOUNTER — Ambulatory Visit: Admitting: Physical Therapy

## 2024-04-08 VITALS — BP 125/67 | HR 67

## 2024-04-08 VITALS — BP 154/71 | HR 63

## 2024-04-08 DIAGNOSIS — R2681 Unsteadiness on feet: Secondary | ICD-10-CM

## 2024-04-08 DIAGNOSIS — I639 Cerebral infarction, unspecified: Secondary | ICD-10-CM

## 2024-04-08 DIAGNOSIS — R278 Other lack of coordination: Secondary | ICD-10-CM

## 2024-04-08 DIAGNOSIS — R2689 Other abnormalities of gait and mobility: Secondary | ICD-10-CM | POA: Diagnosis not present

## 2024-04-08 DIAGNOSIS — R41841 Cognitive communication deficit: Secondary | ICD-10-CM

## 2024-04-08 DIAGNOSIS — M6281 Muscle weakness (generalized): Secondary | ICD-10-CM

## 2024-04-08 DIAGNOSIS — R29818 Other symptoms and signs involving the nervous system: Secondary | ICD-10-CM

## 2024-04-08 DIAGNOSIS — R4701 Aphasia: Secondary | ICD-10-CM

## 2024-04-08 NOTE — Therapy (Signed)
 OUTPATIENT PHYSICAL THERAPY NEURO TREATMENT   Patient Name: [name] MRN: [id number] DOB:1976-09-28, 48 y.o., male Today's Date: 04/08/2024   PCP: Arcadio Knuckles, MD REFERRING PROVIDER: Arcadio Knuckles, MD  END OF SESSION:  PT End of Session - 04/08/24 1539     Visit Number 8    Number of Visits 17    Date for PT Re-Evaluation 05/01/24    Authorization Type [employer] Dual complete    PT Start Time 1534   Handoff w/SLP   PT Stop Time 1616    PT Time Calculation (min) 42 min    Equipment Utilized During Treatment Gait belt    Activity Tolerance Patient tolerated treatment well    Behavior During Therapy WFL for tasks assessed/performed                Past Medical History:  Diagnosis Date   AMPUTATION, BELOW KNEE, 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   Diabetes mellitus without complication [facility])    no since pancreas transplant   Dialysis patient [facility]) 04/18/2012   "[facility]; [place], [place], Sat"   Gastroparesis    Gastropathy    GERD (gastroesophageal reflux disease)    Hypertension    MRSA infection    over 10 years ago per patient. in legs   Past Surgical History:  Procedure Laterality Date   AV FISTULA PLACEMENT  08/2011   left upper arm   BELOW KNEE LEG AMPUTATION  "it's been awhile"   bilaterally   CATARACT EXTRACTION  ~ 2011   right   COMBINED KIDNEY-PANCREAS TRANSPLANT  2014   ESOPHAGOGASTRODUODENOSCOPY N/A 12/30/2016   Procedure: ESOPHAGOGASTRODUODENOSCOPY (EGD);  Surgeon: Alvis Jourdain, MD;  Location: [facility] ENDOSCOPY;  Service: Endoscopy;  Laterality: N/A;   OLECRANON BURSECTOMY Right 06/23/2020   Procedure: RIGHT ELBOW EXCISION OLECRANON BURSITIS;  Surgeon: Timothy Ford, MD;  Location: [facility];  Service: Orthopedics;  Laterality: Right;   Patient Active  Problem List   Diagnosis Date Noted   Mass of right side of neck 02/20/2024   Ischemic cerebrovascular accident (CVA) due to global hypoperfusion with watershed infarction [facility]) 09/20/2023   Hypertension secondary to other renal disorders 09/18/2023   PAD (peripheral artery disease) (HCC) 09/04/2023   Diabetes mellitus type II, controlled (HCC) 09/04/2023   Abnormal electrocardiogram (ECG) (EKG) 07/20/2023   Chronic pain 07/04/2023   Tobacco dependence 07/04/2023   Marijuana dependence (HCC) 07/04/2023   Screen for colon cancer 01/10/2022   Balanitis 01/10/2022   Encounter for general adult medical examination with abnormal findings 01/10/2022   Screening for prostate cancer 01/10/2022   Healthcare maintenance 08/26/2016   Impaired mobility and activities of daily living 07/03/2014   Renal transplant, status post 07/19/2013   History of pancreas transplant (HCC) 07/19/2013   Immunosuppressed status (HCC) 06/27/2013   H/O pancreas transplant (HCC) 06/27/2013   Diabetic gastroparesis associated with type 1 diabetes mellitus (HCC) 05/08/2013   S/P bilateral BKA (below knee amputation) (HCC) 08/17/2012   Metabolic bone disease 07/20/2011   ESRD (end stage renal disease) (HCC) 11/17/2009   GERD 03/14/2007   Dyslipidemia, goal LDL below 100 02/22/2007   Major depressive disorder, recurrent episode (HCC) 02/22/2007   POST TRAUMATIC STRESS DISORDER 02/22/2007   IMPOTENCE,  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    ONSET DATE: 02/10/2024 (referral)   REFERRING DIAG: [id number] (ICD-10-CM) - S/P bilateral BKA (below knee amputation) (HCC) Z74.09,Z78.9 (ICD-10-CM) - Impaired mobility and activities of daily living I63.9 (ICD-10-CM) - Ischemic cerebrovascular accident (CVA) due to global hypoperfusion with watershed infarction [facility])  THERAPY DIAG:  Muscle weakness (generalized)  Unsteadiness on feet  Other abnormalities of gait and mobility  Other lack of coordination  Rationale for Evaluation and  Treatment: Rehabilitation  SUBJECTIVE:                                                                                                                                                                                             SUBJECTIVE STATEMENT: Patient received from SLP, wife present. Reports they see his PCP in 2 weeks and are scheduled w/the eye doctor on May 18th. No falls. Pt reports his vision is still poor.   Pt accompanied by: self  PERTINENT HISTORY: PAD, HTN, Strokes, s/p Kidney and Pancreas Transplant, Carotid Stenosis, T1DM, ESRD, bilateral BKA amputee   PAIN: "I am always in pain" Are you having pain? Yes: NPRS scale: 6/10 Pain location: B LE Pain description: soreness   PRECAUTIONS: Fall  PATIENT GOALS: " To get my walk as close as I can to where I was"   OBJECTIVE:  Note: Objective measures were completed at Evaluation unless otherwise noted.  DIAGNOSTIC FINDINGS: CT of head from 10/11/23  IMPRESSION: Evolving left MCA territory infarcts involving the left frontal and parietal lobes, better seen on recent prior brain MRI. Redemonstrated petechial hemorrhage along the posterior left frontal lobe.  MRI of head from 09/18/23 IMPRESSION: 1. Scattered small foci of acute subcortical infarction in the left occipital lobe and posterior left frontal lobe. 2. Faint diffusion signal abnormality along the left parietal lobe may reflect evolving infarct versus seizure related cytotoxic edema. 3. 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  VITALS  Vitals:   04/08/24 1542 04/08/24 1543  BP: (!) 162/64 (!) 154/71  Pulse: 64 63  TREATMENT:   Self-care/home management  Informed pt to contact Dr. Marye Smoker office to request to be seen, as he may have an earlier opening than pt's other providers. Wife reports she will  contact office today.  Assessed vitals (see above) and systolic elevated but within limits for therapy.  Ther Act  SciFit multi-peaks level 7.5 for 8 minutes using BUE/BLEs for neural priming for reciprocal movement, dynamic cardiovascular warmup and increased amplitude of stepping. Noted RLE abducted throughout, so min cues for R hip adduction w/activity but pt unable to sustain. RPE of 8.5/10 following activity. Reassessed vitals following activity: BP 132/68 mmHg, HR 67 bpm.   Physical Performance   [facility] PT Assessment - 04/08/24 1606       Transfers   Five time sit to stand comments  15.5s   LLE posterior, BUE support     Ambulation/Gait   Gait velocity 32.8' over 29s = 1.37ft/s w/ sbqc            Ther Act (cont).  Gait pattern: step through pattern, decreased step length- Right, decreased stance time- Left, decreased stride length, decreased hip/knee flexion- Left, lateral hip instability, lateral lean- Left, decreased trunk rotation, and wide BOS Distance walked: Various short clinic distances  Assistive device utilized: Quad cane small base Level of assistance: CGA and Min A Comments: Noted Bilateral IR of BLEs w/genu varus postioning, resulting in increased instability on LLE. Wife also noticing this and reports they have an appointment with [facility] next week so will tell prosthetist about this. Will continue to monitor in PT sessions   PATIENT EDUCATION: Education details: Continue HEP, encouragement to call Dr. Marye Smoker office to schedule appointment due to changes in vision, goal results  Person educated: Patient Education method: Explanation Education comprehension: verbalized understanding and needs further education  HOME EXERCISE PROGRAM: Access Code: [email or web address] URL: [email or web address] Date: 03/04/2024 Prepared by: Nickola Baron  Exercises - Standing Hip Abduction with Counter Support  - 1 x daily - 7 x weekly - 3 sets - 10 reps - Standing  March with Counter Support  - 1 x daily - 7 x weekly - 3 sets - 10 reps - Standing Hip Extension with Counter Support  - 1 x daily - 7 x weekly - 3 sets - 10 reps - Mini Squat with Counter Support  - 1 x daily - 7 x weekly - 3 sets - 10 reps - Sit to Stand with Counter Support  - 1 x daily - 7 x weekly - 3 sets - 5 reps -seated march over  - Lower Trunk Rotation Stretch  - 1 x daily - 7 x weekly - 3 sets - 10 reps - Single Leg Bridge  - 1 x daily - 7 x weekly - 1-2 sets - 6-10 reps  GOALS: Goals reviewed with patient? Yes  SHORT TERM GOALS: Target date: 03/27/2024   Pt will perform floor transfer w/min A for improved fall recovery and safety at home  Baseline: Goal status: INITIAL  2.  Pt will improve 5 x STS to less than or equal to 21 seconds w/SBA to demonstrate improved functional strength and transfer efficiency.   Baseline: 26.5s; 15.5s w/BUE support Goal status: MET  3.  Pt will improve gait velocity to at least 1.2 ft/s w/LRAD and CGA for improved gait efficiency and independence  Baseline: 0.8 ft/s w/SBQC and CGA; 1.12 ft/s w/SBQC and CGA Goal status: PARTIALLY MET   4.  Pt will ascend/descend 12 steps w/single rail and  CGA for improved safety in home environment  Baseline: Min A w/bilateral rails  Goal status: INITIAL   LONG TERM GOALS: Target date: 04/24/2024   Pt will perform floor transfer w/CGA for fall recovery and safety  Baseline:  Goal status: INITIAL  2.  Pt will improve 5 x STS to less than or equal to 12 seconds mod I to demonstrate improved functional strength and transfer efficiency.  Baseline: 26.5s; 15.5s (4/14) Goal status: REVISED  3.  Pt will improve gait velocity to at least 1.5 ft/s w/LRAD and SBA for improved gait efficiency and reduced fall risk   Baseline: 0.8 ft/s w/SBQC and CGA Goal status: INITIAL  4.  Pt will navigate 12 stairs w/single rail and SBA for improved safety in home environment.  Baseline: min A w/bilateral rails  Goal  status: INITIAL   ASSESSMENT:  CLINICAL IMPRESSION: Emphasis of skilled PT session on initiating STG assessment, functional endurance and pt education. Pt has met 1 of 4 STG, improving his time and quality of movement on 5x STS, indicative of improved BLE strength and stability. Pt has improved his gait speed w/SBQC, but not quite to goal level, so consider this goal partially met. Pt continues to be limited by L visual field cut and noted pigeon-toe position of BLEs this date, resulting in increased lateral instability, especially on L side. Encouraged wife to call Dr. Marye Smoker office to make appointment given changes in vision. Continue POC.    OBJECTIVE IMPAIRMENTS: Abnormal gait, decreased activity tolerance, decreased balance, decreased cognition, decreased endurance, decreased knowledge of condition, decreased knowledge of use of DME, difficulty walking, decreased strength, decreased safety awareness, impaired perceived functional ability, impaired sensation, improper body mechanics, prosthetic dependency , and pain  ACTIVITY LIMITATIONS: carrying, lifting, bending, standing, squatting, stairs, transfers, bathing, dressing, hygiene/grooming, locomotion level, and caring for others  PARTICIPATION LIMITATIONS: meal prep, cleaning, laundry, medication management, interpersonal relationship, driving, shopping, community activity, and yard work  PERSONAL FACTORS: Fitness, Past/current experiences, and 1-2 comorbidities: bilateral BKA, CVA  are also affecting patient's functional outcome.   REHAB POTENTIAL: Good  CLINICAL DECISION MAKING: Evolving/moderate complexity  EVALUATION COMPLEXITY: Moderate  PLAN:  PT FREQUENCY: 2x/week  PT DURATION: 8 weeks  PLANNED INTERVENTIONS: 97164- PT Re-evaluation, 97110-Therapeutic exercises, 97530- Therapeutic activity, [id number]- Neuromuscular re-education, 97535- Self Care, 78295- Manual therapy, [id number]- Gait training, [phone]- Prosthetic training, [phone]-  Electrical stimulation (manual), Patient/Family education, Balance training, Stair training, Dry Needling, and DME instructions  PLAN FOR NEXT SESSION: Monitor vitals. Stairs, floor transfers, hip strengthening, hamstring strengthening, progressing gait with SBQC vs no AD, tall kneel, side stepping, finish GOAL CHECK, updates on vision/ eye dr/Dr. Linnell Richardson?   Samanthajo Payano E Ikeya Brockel, PT, DPT 04/08/2024, 4:17 PM

## 2024-04-08 NOTE — Therapy (Signed)
 OUTPATIENT SPEECH LANGUAGE PATHOLOGY APHASIA TREATMENT   Patient Name: [name] MRN: [id number] DOB:19-Apr-1976, 48 y.o., male Today's Date: 04/08/2024  PCP: Arcadio Knuckles, MD REFERRING PROVIDER: Arcadio Knuckles, MD  END OF SESSION:  End of Session - 04/08/24 1451     Visit Number 3    Number of Visits 25    Date for SLP Re-Evaluation 05/22/24    SLP Start Time 1450    SLP Stop Time  1530    SLP Time Calculation (min) 40 min    Activity Tolerance Patient tolerated treatment well              Past Medical History:  Diagnosis Date   AMPUTATION, BELOW KNEE, 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   Diabetes mellitus without complication [facility])    no since pancreas transplant   Dialysis patient [facility]) 04/18/2012   "[facility]; [place], [place], Sat"   Gastroparesis    Gastropathy    GERD (gastroesophageal reflux disease)    Hypertension    MRSA infection    over 10 years ago per patient. in legs   Past Surgical History:  Procedure Laterality Date   AV FISTULA PLACEMENT  08/2011   left upper arm   BELOW KNEE LEG AMPUTATION  "it's been awhile"   bilaterally   CATARACT EXTRACTION  ~ 2011   right   COMBINED KIDNEY-PANCREAS TRANSPLANT  2014   ESOPHAGOGASTRODUODENOSCOPY N/A 12/30/2016   Procedure: ESOPHAGOGASTRODUODENOSCOPY (EGD);  Surgeon: Alvis Jourdain, MD;  Location: [facility] ENDOSCOPY;  Service: Endoscopy;  Laterality: N/A;   OLECRANON BURSECTOMY Right 06/23/2020   Procedure: RIGHT ELBOW EXCISION OLECRANON BURSITIS;  Surgeon: Timothy Ford, MD;  Location: [facility];  Service: Orthopedics;  Laterality: Right;   Patient Active Problem List   Diagnosis Date Noted   Mass of right side of neck 02/20/2024   Ischemic cerebrovascular accident (CVA) due to global hypoperfusion with  watershed infarction [facility]) 09/20/2023   Hypertension secondary to other renal disorders 09/18/2023   PAD (peripheral artery disease) (HCC) 09/04/2023   Diabetes mellitus type II, controlled (HCC) 09/04/2023   Abnormal electrocardiogram (ECG) (EKG) 07/20/2023   Chronic pain 07/04/2023   Tobacco dependence 07/04/2023   Marijuana dependence (HCC) 07/04/2023   Screen for colon cancer 01/10/2022   Balanitis 01/10/2022   Encounter for general adult medical examination with abnormal findings 01/10/2022   Screening for prostate cancer 01/10/2022   Healthcare maintenance 08/26/2016   Impaired mobility and activities of daily living 07/03/2014   Renal transplant, status post 07/19/2013   History of pancreas transplant (HCC) 07/19/2013   Immunosuppressed status (HCC) 06/27/2013   H/O pancreas transplant (HCC) 06/27/2013   Diabetic gastroparesis associated with type 1 diabetes mellitus (HCC) 05/08/2013   S/P bilateral BKA (below knee amputation) (HCC) 08/17/2012   Metabolic bone disease 07/20/2011   ESRD (end stage renal disease) (HCC) 11/17/2009   GERD 03/14/2007   Dyslipidemia, goal LDL below 100 02/22/2007   Major depressive disorder, recurrent episode (HCC) 02/22/2007   POST TRAUMATIC STRESS DISORDER 02/22/2007   IMPOTENCE, 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    ONSET DATE: 09/17/24   REFERRING DIAG: W29.562,Z30.865 (ICD-10-CM) - S/P bilateral BKA (below knee amputation) (HCC) Z74.09,Z78.9 (ICD-10-CM) - Impaired mobility and activities of daily living  I63.9 (ICD-10-CM) - Ischemic cerebrovascular accident (CVA) due to global hypoperfusion with watershed infarction [facility])  THERAPY DIAG:  Aphasia  Cognitive communication deficit  Rationale for Evaluation and Treatment: Rehabilitation  SUBJECTIVE:   SUBJECTIVE STATEMENT: Pt reports improvement with communication, denies swallowing deficits  Pt accompanied by: significant other [name]  PERTINENT HISTORY: Patient is a 48 yo male who presented to ED 9/23  with AMS. Pt found to be aphasic with a facial droop and a code stroke was activated. Intubated 9/23-9/24. MRI Brain with scattered small foci of acute subcortical infarction in L occipital lobe and posterior L frontal lobe and occluded L ICA. Recently admitted 9/9-9/11 with findings of E coli UTI and poorly controlled HTN. 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 Therapy evaluations completed with recommendations for CIR. Patient admitted 09/29/23. D/C from CIR11/1/24 with [name] ST/PT/OT.   PAIN:  Are you having pain? Pt denies pain   FALLS: Has patient fallen in last 6 months?  See PT evaluation for details  LIVING ENVIRONMENT: Lives with: lives with their spouse Lives in: House/apartment  PLOF:  Level of assistance: Independent with ADLs, Independent with IADLs Employment: On disability  PATIENT GOALS: "To make my speech better"  OBJECTIVE:  Note: Objective measures were completed at Evaluation unless otherwise noted.  DYSPHAGIA EVALUATION: SUBJECTIVE DYSPHAGIA REPORTS: Reported symptoms: globus sensation  Current diet: regular and thin liquids  Co-morbid voice changes: No  FACTORS WHICH MAY INCREASE RISK OF ADVERSE EVENT IN PRESENCE OF ASPIRATION:  General health: well appearing  Risk factors: none evident     ORAL MOTOR EXAMINATION: Overall status: WFL  CLINICAL SWALLOW ASSESSMENT:   Dentition: adequate natural dentition Vocal quality at baseline: normal Patient directly observed with POs: Yes:  3 oz water challenge, thin liquids   Feeding: able to feed self Liquids provided by: cup [facility] Swallow Protocol: Pass Oral phase signs and symptoms: none Pharyngeal phase signs and symptoms: none                                                                                                                    TREATMENT DATE:   04/08/24: Targeted word finding and written expression at word level  naming and writing car makes or models- with occasional semantic cues from spouse and ST, [name] named and wrote 13 cars - occasional min A to ID and self correct errors. Trained in verbal compensations for aphasia generating 3-4 sentence descriptions for 8 basic objects - [name] required questioning cues 4x to generate most salient descriptions. Targeted verbal compensations for aphasia generating 3 similarities and 3 differences for 3 sets of related words with occasional  semantic and questioning cues.   04/04/24: swallow eval completed, see above.   Addressed anomia via direct patient instruction for anomia strategies. Led pt through structured language task generating salient features of target words to build lexical connections and enhance understanding re: description anomia strategy. Pt successfully described x2 targets with min-A  questioning cues for expanding lexicon.   02/28/24: Eval only  PATIENT EDUCATION: Education details: compensations for aphasia; compensations for cognition, See Treatment, See Patient Instructions Person educated: Patient and Spouse Education method: Explanation, Verbal cues, and Handouts Education comprehension: verbal cues required and needs further education   GOALS: Goals reviewed with patient? Yes  SHORT TERM GOALS: Target date: 03/27/24  Pt will name 15 items in personally relevant category Baseline: Goal status: INITIAL  2.  Pt will employ verbal compensations for aphasia in structured naming task 3/5 opportunities with rare min A Baseline:  Goal status: INITIAL  3.  Pt will generate moderately complex sentences in structured language task with occasional min A Baseline:  Goal status: INITIAL  4.  Pt will write a phrase level with occasional min to mod A Baseline:  Goal status: INITIAL  5.  Pt will use compensations for memory to manage am meds with rare min A from spouse Baseline:  Goal status: INITIAL  6.  Pt will use verbal  compensations for aphasia during simple conversation 2/4 opportunities with usual mod A Baseline:  Goal status: INITIAL  LONG TERM GOALS: Target date: 05/22/24  Pt will use verbal compensations for aphasia during moderately complex conversation 4/6 opportunities with occasional min A Baseline:  Goal status: INITIAL  2.  Pt will write at sentence level, ID and self correct errors with occasional min A Baseline:  Goal status: INITIAL  3.  Pt will send 3 simple texts with rare min A Baseline:  Goal status: INITIAL  4.  Pt will use compensations for aphasia in detailed or persuasive narrative 4/6 opportunities with occasional min A Baseline:  Goal status: INITIAL  5.  Pt will improve score on Communicative Participation Item Bank Baseline: 15 Goal status: INITIAL   ASSESSMENT:  CLINICAL IMPRESSION: Patient is a 48 y.o. 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 He and his spouse, [name], report difficulty with word finding, semantic paraphasias and difficulty getting words out quickly. [name] endorses frustration with communication difficulties as does [name]. [name] is managing his medications at this time. I recommend skilled ST to maximize communication for safety, independence and to reduce caregiver burden.   OBJECTIVE IMPAIRMENTS: include attention, memory, aphasia, and apraxia. These impairments are limiting patient from managing medications, managing appointments, and effectively communicating at home and in community. Factors affecting potential to achieve goals and functional outcome are co-morbidities. Patient will benefit from skilled SLP services to address above impairments and improve overall function.  REHAB POTENTIAL: Good  PLAN:  SLP FREQUENCY: 2x/week  SLP DURATION: 12 weeks  PLANNED INTERVENTIONS: Language facilitation, Environmental controls, Cueing hierachy, Cognitive reorganization,  Internal/external aids, Functional tasks, Multimodal communication approach, and SLP instruction and feedback    Bessye Stith, Dareen Ebbing, CCC-SLP 04/08/2024, 3:44 PM

## 2024-04-08 NOTE — Therapy (Signed)
 OUTPATIENT OCCUPATIONAL THERAPY NEURO TREATMENT  Patient Name: [name] MRN: [id number] DOB:Nov 23, 1976, 47 y.o., male Today's Date: 04/08/2024  PCP: Sandra Crouch, MD REFERRING PROVIDER: Arcadio Knuckles, MD  END OF SESSION:  OT End of Session - 04/08/24 1408     Visit Number 9    Number of Visits 17    Date for OT Re-Evaluation 04/26/24    Authorization Type [employer] - Medicare    OT Start Time 1407    OT Stop Time 1445    OT Time Calculation (min) 38 min    Activity Tolerance Patient tolerated treatment well    Behavior During Therapy WFL for tasks assessed/performed             Past Medical History:  Diagnosis Date   AMPUTATION, BELOW KNEE, 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   Diabetes mellitus without complication [facility])    no since pancreas transplant   Dialysis patient [facility]) 04/18/2012   "[facility]; [place], [place], Sat"   Gastroparesis    Gastropathy    GERD (gastroesophageal reflux disease)    Hypertension    MRSA infection    over 10 years ago per patient. in legs   Past Surgical History:  Procedure Laterality Date   AV FISTULA PLACEMENT  08/2011   left upper arm   BELOW KNEE LEG AMPUTATION  "it's been awhile"   bilaterally   CATARACT EXTRACTION  ~ 2011   right   COMBINED KIDNEY-PANCREAS TRANSPLANT  2014   ESOPHAGOGASTRODUODENOSCOPY N/A 12/30/2016   Procedure: ESOPHAGOGASTRODUODENOSCOPY (EGD);  Surgeon: Alvis Jourdain, MD;  Location: [facility] ENDOSCOPY;  Service: Endoscopy;  Laterality: N/A;   OLECRANON BURSECTOMY Right 06/23/2020   Procedure: RIGHT ELBOW EXCISION OLECRANON BURSITIS;  Surgeon: Timothy Ford, MD;  Location: [facility];  Service: Orthopedics;  Laterality: Right;   Patient Active Problem List   Diagnosis Date Noted   Mass of right side of neck 02/20/2024    Ischemic cerebrovascular accident (CVA) due to global hypoperfusion with watershed infarction [facility]) 09/20/2023   Hypertension secondary to other renal disorders 09/18/2023   PAD (peripheral artery disease) (HCC) 09/04/2023   Diabetes mellitus type II, controlled (HCC) 09/04/2023   Abnormal electrocardiogram (ECG) (EKG) 07/20/2023   Chronic pain 07/04/2023   Tobacco dependence 07/04/2023   Marijuana dependence (HCC) 07/04/2023   Screen for colon cancer 01/10/2022   Balanitis 01/10/2022   Encounter for general adult medical examination with abnormal findings 01/10/2022   Screening for prostate cancer 01/10/2022   Healthcare maintenance 08/26/2016   Impaired mobility and activities of daily living 07/03/2014   Renal transplant, status post 07/19/2013   History of pancreas transplant (HCC) 07/19/2013   Immunosuppressed status (HCC) 06/27/2013   H/O pancreas transplant (HCC) 06/27/2013   Diabetic gastroparesis associated with type 1 diabetes mellitus (HCC) 05/08/2013   S/P bilateral BKA (below knee amputation) (HCC) 08/17/2012   Metabolic bone disease 07/20/2011   ESRD (end stage renal disease) (HCC) 11/17/2009   GERD 03/14/2007   Dyslipidemia, goal LDL below 100 02/22/2007   Major depressive disorder, recurrent episode (HCC) 02/22/2007   POST TRAUMATIC STRESS DISORDER 02/22/2007   IMPOTENCE, 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    ONSET DATE: 02/10/2024 (Date of referral)  REFERRING DIAG: Z89.512,Z89.511 (ICD-10-CM) - S/P  bilateral BKA (below knee amputation) (HCC) Z74.09,Z78.9 (ICD-10-CM) - Impaired mobility and activities of daily living I63.9 (ICD-10-CM) - Ischemic cerebrovascular accident (CVA) due to global hypoperfusion with watershed infarction  THERAPY DIAG:  Muscle weakness (generalized)  Other lack of coordination  Other symptoms and signs involving the nervous system  Acute CVA (cerebrovascular accident) (HCC)  Ischemic cerebrovascular accident (CVA) due to global hypoperfusion with  watershed infarction [facility])  Cognitive communication deficit  Rationale for Evaluation and Treatment: Rehabilitation  SUBJECTIVE:   SUBJECTIVE STATEMENT: From eval: 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   Pt reports Vision in L eye is worse. Able to see shapes but nothing is clear. He feels extra BP med midday is helping.   He is seeing PCP on 4/30 with hopes they might be able to get into ophthalmologist sooner.   Pt accompanied by: self and wife, [name]  PERTINENT HISTORY:  PAD, HTN, Strokes, s/p Kidney and Pancreas Transplant, Carotid Stenosis, T1DM, ESRD, bilateral BKA amputee   PRECAUTIONS: None  WEIGHT BEARING RESTRICTIONS: No  PAIN:  Are you having pain? 5/10 RLE > LLE  FALLS: Has patient fallen in last 6 months? Yes. Number of falls 1  LIVING ENVIRONMENT: Lives with: lives with their spouse Lives in: House/apartment Stairs: Yes: Internal: 15 steps; on right going up and External: 5 steps; bilateral but cannot reach both Has following equipment at home: Quad cane small base, Walker - 2 wheeled, Wheelchair (manual), and shower chair  PLOF: Independent; was driving;   PATIENT GOALS: get back to PLOF  OBJECTIVE:  Note: Objective measures were completed at Evaluation unless otherwise noted.  HAND DOMINANCE: Right  ADLs: Overall ADLs: mod I   IADLs: Light housekeeping: sweeping, gathers up laundry, dishes  Meal Prep: mod I Community mobility: dependent Medication management: setup Handwriting: 50% legible, Increased time, and due to cognitive impairments  MOBILITY STATUS: Independent - manual wheelchair  ACTIVITY TOLERANCE: Activity tolerance: fair  FUNCTIONAL OUTCOME MEASURES:  04/01/24 - 8.3   Total score = sum of the activity scores/number of activities Minimum detectable change (90%CI) for average score = 2 points Minimum detectable change (90%CI) for single activity score = 3 points  UPPER EXTREMITY ROM:  Impaired R supination ~ neutral though able to  improve to [facility] with increased time and awareness.     BUE - WNL though weaker on R vs L  HAND FUNCTION: Grip strength: Right: 81.5 lbs; Left: 97.8 lbs  COORDINATION: 9 Hole Peg test: Right: 47 sec; Left: 33 sec - mild tremor in R hand  SENSATION: WFL  EDEMA: none reported or observed  MUSCLE TONE:WFL  COGNITION: Overall cognitive status: Impaired - see ST for further details  VISION: Subjective report: Pt reports vision fluctuates including episodes of double vision and L visual field cut Baseline vision: Bifocals, Wears glasses all the time, and ST bifocals Visual history: cataracts and corrective eye surgery  VISION ASSESSMENT: WFL distance and near acuity; ~45* L visual field  PERCEPTION: Impaired: secondary to visual impairments; pt required cues to locate hand sanitizer on table, reaching to Kleenex originally  PRAXIS: Impaired: Motor planning and potential ideational praxis  OBSERVATIONS: Pt appears well-kept. Glasses hanging on shirt. Pt able to self-propel manual wheelchair.  TREATMENT :  Self-Care Vital signs assessed, see vital signs: BP 125/67. Pt denied symptoms of dizziness, lightheadedness, headache, blurry vision in L eye. BP within therapeutic parameters. Pt reported continued L vision changes.   TherAct OT reviewed vision compensation strategies. Pt acknowledged understanding.  OT placed approximately numerous coins on table in front of patient and had patient locate them as called out by therapist for improved scanning and locating of items to promote visual neuro rehabilitation following CVA.  For additional challenge to affected extremity, therapist had patient remove coins with affected extremity for shoulder stability, ROM, and fine motor coordination. With use of right, pt placed and then removed colored, small pegs into  corresponding hole with use of pattern sheets for ROM, coordination, and strength of affected extremity. Pt picked up 3 pegs, stored them in hand, and then placed pegs one at a time using 2 point pinch for additional fine motor challenge.Increased time required due to color blindness and visual acuity.  PATIENT EDUCATION: Education details: see today's tx above Person educated: Patient and Spouse Education method: Explanation, Demonstration, and Verbal cues Education comprehension: verbalized understanding, returned demonstration, verbal cues required, and needs further education  HOME EXERCISE PROGRAM: 03/04/24 - tremor compensation strategies 03/11/2024: vision strategies and visual scanning 03/14/24 - vision HEP (see pt instructions) 04/04/24 - red theraband RUE - Access Code: [email or web address]  GOALS:  SHORT TERM GOALS: Target date: 03/27/2024   Patient will demonstrate initial RUE HEP with 25% verbal cues or less for proper execution. 04/01/24 - Pt benefited from v/c.  Goal status: IN Progress  2.  Patient will independently recall at least 2 compensatory strategies for visual impairment without cueing.  04/01/24 - With min v/c, pt recalled turning head to look and looking for the edges of objects.  04/04/24 - Pt ind recalled using systematic scanning method and turning head to look. Goal status: MET  3.  Patient will demonstrate at least 85% accuracy with environmental visual scanning to assist with ADLs and IADLS including potential driving considerations.  04/01/24 - Pt completed visual scanning task in clinic with 80% accuracy while self-propelling w/c. 04/04/24 - Pt identified 100% of items in Search and Find visual scanning activity book with extra time, including items on L side. Goal status: IN Progress  4.  Pt will independently recall at least 2 tremor reduction strategies.   Baseline:  03/04/24 - OT educated pt on tremor compensation strategies. Pt returned demo. 04/01/24 - Pt ind recalled  using weighted pen and stabilize arms on table. Goal status: MET  LONG TERM GOALS: Target date: 04/26/2024  Patient will demonstrate updated RUE HEP with visual handouts only for proper execution.  Goal status: IN Progress  2.  03/04/24 revised - Patient will report at least two-point increase in average PSFS score or at least three-point increase in a single activity score indicating functionally significant improvement given minimum detectable change. Baseline: PSFS - 3.3 total score (See above for individual activity scores)  04/01/24 - 8.3 Goal status: MET  3.  Patient will demo improved FM coordination as evidenced by completing nine-hole peg with use of R in 35 seconds or less. Baseline: Right: 47 secs 04/01/24 - Trial 1 - Right: 57 seconds, Trial 2 - Right: 38 seconds Goal status: IN Progress  4.  Patient will demonstrate at least 88 lbs R grip strength as needed to open jars and other containers. Baseline: Right: 81.5 lbs 04/01/24 - Right: 85.5, 81.1, 73.6 (80.06 lbs average) Goal status: IN  Progress  ASSESSMENT:  CLINICAL IMPRESSION: Pt demonstrating good tolerance to tasks this date. Requiring some adjustment given pt is color blind and due to poor acuity in L eye.  PERFORMANCE DEFICITS: in functional skills including ADLs, IADLs, coordination, proprioception, sensation, strength, Fine motor control, decreased knowledge of precautions, decreased knowledge of use of DME, vision, and UE functional use, cognitive skills including attention, memory, and problem solving, and psychosocial skills including coping strategies.   IMPAIRMENTS: are limiting patient from ADLs, IADLs, rest and sleep, leisure, and social participation.   CO-MORBIDITIES: may have co-morbidities  that affects occupational performance. Patient will benefit from skilled OT to address above impairments and improve overall function.  REHAB POTENTIAL: Fair given comorbidities  PLAN:  OT FREQUENCY: 2x/week  OT  DURATION: 8 weeks  PLANNED INTERVENTIONS: 97168 OT Re-evaluation, 97535 self care/ADL training, 10272 therapeutic exercise, 97530 therapeutic activity, 97112 neuromuscular re-education, 97140 manual therapy, visual/perceptual remediation/compensation, coping strategies training, patient/family education, and DME and/or AE instructions  RECOMMENDED OTHER SERVICES: N/A for this visit  CONSULTED AND AGREED WITH PLAN OF CARE: Patient and family member/caregiver  PLAN FOR NEXT SESSION:  Progress report  Altamease Asters, OT 04/08/2024, 3:44 PM

## 2024-04-11 ENCOUNTER — Ambulatory Visit: Admitting: Occupational Therapy

## 2024-04-11 ENCOUNTER — Ambulatory Visit: Admitting: Physical Therapy

## 2024-04-11 VITALS — BP 167/72 | HR 66

## 2024-04-11 DIAGNOSIS — I639 Cerebral infarction, unspecified: Secondary | ICD-10-CM

## 2024-04-11 DIAGNOSIS — M6281 Muscle weakness (generalized): Secondary | ICD-10-CM

## 2024-04-11 DIAGNOSIS — R29818 Other symptoms and signs involving the nervous system: Secondary | ICD-10-CM

## 2024-04-11 DIAGNOSIS — R278 Other lack of coordination: Secondary | ICD-10-CM | POA: Diagnosis not present

## 2024-04-11 DIAGNOSIS — R2681 Unsteadiness on feet: Secondary | ICD-10-CM

## 2024-04-11 DIAGNOSIS — R41841 Cognitive communication deficit: Secondary | ICD-10-CM

## 2024-04-11 DIAGNOSIS — R2689 Other abnormalities of gait and mobility: Secondary | ICD-10-CM | POA: Diagnosis not present

## 2024-04-11 DIAGNOSIS — R4701 Aphasia: Secondary | ICD-10-CM

## 2024-04-11 NOTE — Therapy (Addendum)
 OUTPATIENT PHYSICAL THERAPY NEURO TREATMENT   Patient Name: Waylon Hershey MRN: [id number] DOB:[birth year], 48 y.o., male Today's Date: 04/11/2024   PCP: Arcadio Knuckles, MD REFERRING PROVIDER: Arcadio Knuckles, MD  END OF SESSION:  PT End of Session - 04/11/24 1406     Visit Number 9    Number of Visits 17    Date for PT Re-Evaluation 05/01/24    Authorization Type UHC Dual complete    PT Start Time 1404    PT Stop Time 1443    PT Time Calculation (min) 39 min    Equipment Utilized During Treatment Gait belt    Activity Tolerance Patient tolerated treatment well    Behavior During Therapy WFL for tasks assessed/performed                Past Medical History:  Diagnosis Date   AMPUTATION, BELOW KNEE, 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   Diabetes mellitus without complication [facility])    no since pancreas transplant   Dialysis patient [facility]) 04/18/2012   "[facility]; [place], [place], Sat"   Gastroparesis    Gastropathy    GERD (gastroesophageal reflux disease)    Hypertension    MRSA infection    over 10 years ago per patient. in legs   Past Surgical History:  Procedure Laterality Date   AV FISTULA PLACEMENT  08/2011   left upper arm   BELOW KNEE LEG AMPUTATION  "it's been awhile"   bilaterally   CATARACT EXTRACTION  ~ 2011   right   COMBINED KIDNEY-PANCREAS TRANSPLANT  2014   ESOPHAGOGASTRODUODENOSCOPY N/A 12/30/2016   Procedure: ESOPHAGOGASTRODUODENOSCOPY (EGD);  Surgeon: Alvis Jourdain, MD;  Location: [facility] ENDOSCOPY;  Service: Endoscopy;  Laterality: N/A;   OLECRANON BURSECTOMY Right 06/23/2020   Procedure: RIGHT ELBOW EXCISION OLECRANON BURSITIS;  Surgeon: Timothy Ford, MD;  Location: [facility];  Service: Orthopedics;  Laterality: Right;   Patient Active Problem List    Diagnosis Date Noted   Mass of right side of neck 02/20/2024   Ischemic cerebrovascular accident (CVA) due to global hypoperfusion with watershed infarction [facility]) 09/20/2023   Hypertension secondary to other renal disorders 09/18/2023   PAD (peripheral artery disease) (HCC) 09/04/2023   Diabetes mellitus type II, controlled (HCC) 09/04/2023   Abnormal electrocardiogram (ECG) (EKG) 07/20/2023   Chronic pain 07/04/2023   Tobacco dependence 07/04/2023   Marijuana dependence (HCC) 07/04/2023   Screen for colon cancer 01/10/2022   Balanitis 01/10/2022   Encounter for general adult medical examination with abnormal findings 01/10/2022   Screening for prostate cancer 01/10/2022   Healthcare maintenance 08/26/2016   Impaired mobility and activities of daily living 07/03/2014   Renal transplant, status post 07/19/2013   History of pancreas transplant (HCC) 07/19/2013   Immunosuppressed status (HCC) 06/27/2013   H/O pancreas transplant (HCC) 06/27/2013   Diabetic gastroparesis associated with type 1 diabetes mellitus (HCC) 05/08/2013   S/P bilateral BKA (below knee amputation) (HCC) 08/17/2012   Metabolic bone disease 07/20/2011   ESRD (end stage renal disease) (HCC) 11/17/2009   GERD 03/14/2007   Dyslipidemia, goal LDL below 100 02/22/2007   Major depressive disorder, recurrent episode (HCC) 02/22/2007   POST TRAUMATIC STRESS DISORDER 02/22/2007   IMPOTENCE, 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  ONSET DATE: 02/10/2024 (referral)   REFERRING DIAG: [phone] (ICD-10-CM) - S/P bilateral BKA (below knee amputation) (HCC) Z74.09,Z78.9 (ICD-10-CM) - Impaired mobility and activities of daily living I63.9 (ICD-10-CM) - Ischemic cerebrovascular accident (CVA) due to global hypoperfusion with watershed infarction [facility])  THERAPY DIAG:  Unsteadiness on feet  Other abnormalities of gait and mobility  Other lack of coordination  Muscle weakness (generalized)  Rationale for Evaluation and Treatment:  Rehabilitation  SUBJECTIVE:                                                                                                                                                                                             SUBJECTIVE STATEMENT: Pt presents alone in WC. Denies falls or acute changes. HEP is "going"   Pt accompanied by: self  PERTINENT HISTORY: PAD, HTN, Strokes, s/p Kidney and Pancreas Transplant, Carotid Stenosis, T1DM, ESRD, bilateral BKA amputee   PAIN: "I am always in pain" Are you having pain? Yes: NPRS scale: 6/10 Pain location: B LE Pain description: soreness   PRECAUTIONS: Fall  PATIENT GOALS: " To get my walk as close as I can to where I was"   OBJECTIVE:  Note: Objective measures were completed at Evaluation unless otherwise noted.  DIAGNOSTIC FINDINGS: CT of head from 10/11/23  IMPRESSION: Evolving left MCA territory infarcts involving the left frontal and parietal lobes, better seen on recent prior brain MRI. Redemonstrated petechial hemorrhage along the posterior left frontal lobe.  MRI of head from 09/18/23 IMPRESSION: 1. Scattered small foci of acute subcortical infarction in the left occipital lobe and posterior left frontal lobe. 2. Faint diffusion signal abnormality along the left parietal lobe may reflect evolving infarct versus seizure related cytotoxic edema. 3. 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  VITALS  Vitals:   04/11/24 1409 04/11/24 1412  BP: (!) 166/74 (!) 167/72  Pulse: 66 66                                                                                                                            TREATMENT:   Self-care/home  management  Assessed vitals (see above) and systolic elevated this date. Continued to closely monitor throughout session as pt reports he took his meds <30 minutes prior to session.   Ther Act  SciFit multi-peaks level 7.0 for 8 minutes using BUE/BLEs for neural priming for  reciprocal movement, dynamic cardiovascular warmup and increased amplitude of stepping. Noted improved R hip adduction today. Paused activity halfway and reassessed vitals as follows: BP 150/66 mmHg, HR 69 BPM. RPE of 8/10 following activity    NMR  Attempted floor transfer, but pt declined this date due to fatigue. On mat table, pt assumed tall kneel position w/o prosthetics donned and w/CGA. Placed bench on mat to provide UE support during activity:  Hip abduction, x10 reps per side w/CGA. Decreased truncal lean compensation noted this date, but pt still challenged w/single leg stability on RLE  Modified tall to half kneel taps to 2" step placed under bench to prime floor transfer, x8 reps per side. Increased difficulty performing on RLE. Pt very fatigued following activity Sit to stands w/single LE elevated on 2" step, x5 reps per side for improved single leg stability, weight acceptance on RLE and functional BLE strength. Pt required BUE support and several attempts to stand when LLE on step.   PATIENT EDUCATION: Education details: Continue HEP  Person educated: Patient Education method: Explanation Education comprehension: verbalized understanding  HOME EXERCISE PROGRAM: Access Code: [email or web address] URL: [email or web address] Date: 03/04/2024 Prepared by: Nickola Baron  Exercises - Standing Hip Abduction with Counter Support  - 1 x daily - 7 x weekly - 3 sets - 10 reps - Standing March with Counter Support  - 1 x daily - 7 x weekly - 3 sets - 10 reps - Standing Hip Extension with Counter Support  - 1 x daily - 7 x weekly - 3 sets - 10 reps - Mini Squat with Counter Support  - 1 x daily - 7 x weekly - 3 sets - 10 reps - Sit to Stand with Counter Support  - 1 x daily - 7 x weekly - 3 sets - 5 reps -seated march over  - Lower Trunk Rotation Stretch  - 1 x daily - 7 x weekly - 3 sets - 10 reps - Single Leg Bridge  - 1 x daily - 7 x weekly - 1-2 sets - 6-10 reps  GOALS: Goals  reviewed with patient? Yes  SHORT TERM GOALS: Target date: 03/27/2024   Pt will perform floor transfer w/min A for improved fall recovery and safety at home  Baseline: Goal status: NOT MET - pt declined trying on 4/17   2.  Pt will improve 5 x STS to less than or equal to 21 seconds w/SBA to demonstrate improved functional strength and transfer efficiency.   Baseline: 26.5s; 15.5s w/BUE support Goal status: MET  3.  Pt will improve gait velocity to at least 1.2 ft/s w/LRAD and CGA for improved gait efficiency and independence  Baseline: 0.8 ft/s w/SBQC and CGA; 1.12 ft/s w/SBQC and CGA Goal status: PARTIALLY MET   4.  Pt will ascend/descend 12 steps w/single rail and CGA for improved safety in home environment  Baseline: Min A w/bilateral rails  Goal status: IN PROGRESS   LONG TERM GOALS: Target date: 04/24/2024   Pt will perform floor transfer w/CGA for fall recovery and safety  Baseline:  Goal status: INITIAL  2.  Pt will improve 5 x STS to less than or equal to 12 seconds mod I to demonstrate  improved functional strength and transfer efficiency.  Baseline: 26.5s; 15.5s (4/14) Goal status: REVISED  3.  Pt will improve gait velocity to at least 1.5 ft/s w/LRAD and SBA for improved gait efficiency and reduced fall risk   Baseline: 0.8 ft/s w/SBQC and CGA Goal status: INITIAL  4.  Pt will navigate 12 stairs w/single rail and SBA for improved safety in home environment.  Baseline: min A w/bilateral rails  Goal status: INITIAL   ASSESSMENT:  CLINICAL IMPRESSION: Emphasis of skilled PT session on monitoring BP, completing STG assessment and improving single leg stability and functional hip strength to prime floor transfer. Pt's BP elevated at beginning of session but did reduce by end of session. Pt declined attempting full floor transfer to assess STG #1 today, so performed part-practice tall to half-kneel on mat which was difficult for pt. Pt continues to be limited by high  levels of fatigue and impaired vision on L side. Pt reports he will be out of town next week, so unable to make appointments w/MD regarding changes in vision until he returns. Continue POC.    OBJECTIVE IMPAIRMENTS: Abnormal gait, decreased activity tolerance, decreased balance, decreased cognition, decreased endurance, decreased knowledge of condition, decreased knowledge of use of DME, difficulty walking, decreased strength, decreased safety awareness, impaired perceived functional ability, impaired sensation, improper body mechanics, prosthetic dependency , and pain  ACTIVITY LIMITATIONS: carrying, lifting, bending, standing, squatting, stairs, transfers, bathing, dressing, hygiene/grooming, locomotion level, and caring for others  PARTICIPATION LIMITATIONS: meal prep, cleaning, laundry, medication management, interpersonal relationship, driving, shopping, community activity, and yard work  PERSONAL FACTORS: Fitness, Past/current experiences, and 1-2 comorbidities: bilateral BKA, CVA  are also affecting patient's functional outcome.   REHAB POTENTIAL: Good  CLINICAL DECISION MAKING: Evolving/moderate complexity  EVALUATION COMPLEXITY: Moderate  PLAN:  PT FREQUENCY: 2x/week  PT DURATION: 8 weeks  PLANNED INTERVENTIONS: 97164- PT Re-evaluation, 97110-Therapeutic exercises, 97530- Therapeutic activity, [id number]- Neuromuscular re-education, 97535- Self Care, 16109- Manual therapy, [id number]- Gait training, [phone]- Prosthetic training, [phone]- Electrical stimulation (manual), Patient/Family education, Balance training, Stair training, Dry Needling, and DME instructions  PLAN FOR NEXT SESSION: 10th visit PN. Monitor vitals. Stairs, floor transfers, hip strengthening, hamstring strengthening, progressing gait with SBQC vs no AD, tall kneel, side stepping   Malcome Ambrocio E Destry Bezdek, PT, DPT 04/11/2024, 2:46 PM

## 2024-04-11 NOTE — Therapy (Signed)
 OUTPATIENT OCCUPATIONAL THERAPY NEURO TREATMENT AND PROGRESS NOTE  Patient Name: [name] MRN: [id number] DOB:20-Oct-1976, 48 y.o., male Today's Date: 04/11/2024  PCP: Sandra Crouch, MD REFERRING PROVIDER: Arcadio Knuckles, MD  END OF SESSION:  OT End of Session - 04/11/24 1446     Visit Number 10    Number of Visits 17    Date for OT Re-Evaluation 04/26/24    Authorization Type UHC - Medicare    OT Start Time 1445    OT Stop Time 1530    OT Time Calculation (min) 45 min    Activity Tolerance Patient tolerated treatment well    Behavior During Therapy WFL for tasks assessed/performed            Past Medical History:  Diagnosis Date   AMPUTATION, BELOW KNEE, 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   Diabetes mellitus without complication [facility])    no since pancreas transplant   Dialysis patient [facility]) 04/18/2012   "[facility]; [place], [place], Sat"   Gastroparesis    Gastropathy    GERD (gastroesophageal reflux disease)    Hypertension    MRSA infection    over 10 years ago per patient. in legs   Past Surgical History:  Procedure Laterality Date   AV FISTULA PLACEMENT  08/2011   left upper arm   BELOW KNEE LEG AMPUTATION  "it's been awhile"   bilaterally   CATARACT EXTRACTION  ~ 2011   right   COMBINED KIDNEY-PANCREAS TRANSPLANT  2014   ESOPHAGOGASTRODUODENOSCOPY N/A 12/30/2016   Procedure: ESOPHAGOGASTRODUODENOSCOPY (EGD);  Surgeon: Alvis Jourdain, MD;  Location: [facility] ENDOSCOPY;  Service: Endoscopy;  Laterality: N/A;   OLECRANON BURSECTOMY Right 06/23/2020   Procedure: RIGHT ELBOW EXCISION OLECRANON BURSITIS;  Surgeon: Timothy Ford, MD;  Location: [facility];  Service: Orthopedics;  Laterality: Right;   Patient Active Problem List   Diagnosis Date Noted   Mass of right side of  neck 02/20/2024   Ischemic cerebrovascular accident (CVA) due to global hypoperfusion with watershed infarction [facility]) 09/20/2023   Hypertension secondary to other renal disorders 09/18/2023   PAD (peripheral artery disease) (HCC) 09/04/2023   Diabetes mellitus type II, controlled (HCC) 09/04/2023   Abnormal electrocardiogram (ECG) (EKG) 07/20/2023   Chronic pain 07/04/2023   Tobacco dependence 07/04/2023   Marijuana dependence (HCC) 07/04/2023   Screen for colon cancer 01/10/2022   Balanitis 01/10/2022   Encounter for general adult medical examination with abnormal findings 01/10/2022   Screening for prostate cancer 01/10/2022   Healthcare maintenance 08/26/2016   Impaired mobility and activities of daily living 07/03/2014   Renal transplant, status post 07/19/2013   History of pancreas transplant (HCC) 07/19/2013   Immunosuppressed status (HCC) 06/27/2013   H/O pancreas transplant (HCC) 06/27/2013   Diabetic gastroparesis associated with type 1 diabetes mellitus (HCC) 05/08/2013   S/P bilateral BKA (below knee amputation) (HCC) 08/17/2012   Metabolic bone disease 07/20/2011   ESRD (end stage renal disease) (HCC) 11/17/2009   GERD 03/14/2007   Dyslipidemia, goal LDL below 100 02/22/2007   Major depressive disorder, recurrent episode (HCC) 02/22/2007   POST TRAUMATIC STRESS DISORDER 02/22/2007   IMPOTENCE, 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    ONSET DATE: 02/10/2024 (Date of referral)  REFERRING DIAG: Z89.512,Z89.511 (ICD-10-CM) -  S/P bilateral BKA (below knee amputation) (HCC) Z74.09,Z78.9 (ICD-10-CM) - Impaired mobility and activities of daily living I63.9 (ICD-10-CM) - Ischemic cerebrovascular accident (CVA) due to global hypoperfusion with watershed infarction  THERAPY DIAG:  Other lack of coordination  Muscle weakness (generalized)  Other symptoms and signs involving the nervous system  Acute CVA (cerebrovascular accident) (HCC)  Ischemic cerebrovascular accident (CVA) due to global  hypoperfusion with watershed infarction [facility])  Cognitive communication deficit  Aphasia  Rationale for Evaluation and Treatment: Rehabilitation  SUBJECTIVE:   SUBJECTIVE STATEMENT: From eval: 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   Pt reports Vision in L eye is worse. Able to see shapes but nothing is clear. He feels extra BP med midday is helping.   He is seeing PCP on 4/30 with hopes they might be able to get into ophthalmologist sooner.   Pt accompanied by: self  PERTINENT HISTORY:  PAD, HTN, Strokes, s/p Kidney and Pancreas Transplant, Carotid Stenosis, T1DM, ESRD, bilateral BKA amputee   PRECAUTIONS: None  WEIGHT BEARING RESTRICTIONS: No  PAIN:  Are you having pain? 5/10 RLE > LLE  FALLS: Has patient fallen in last 6 months? Yes. Number of falls 1  LIVING ENVIRONMENT: Lives with: lives with their spouse Lives in: House/apartment Stairs: Yes: Internal: 15 steps; on right going up and External: 5 steps; bilateral but cannot reach both Has following equipment at home: Quad cane small base, Walker - 2 wheeled, Wheelchair (manual), and shower chair  PLOF: Independent; was driving;   PATIENT GOALS: get back to PLOF  OBJECTIVE:  Note: Objective measures were completed at Evaluation unless otherwise noted.  HAND DOMINANCE: Right  ADLs: Overall ADLs: mod I   IADLs: Light housekeeping: sweeping, gathers up laundry, dishes  Meal Prep: mod I Community mobility: dependent Medication management: setup Handwriting: 50% legible, Increased time, and due to cognitive impairments  MOBILITY STATUS: Independent - manual wheelchair  ACTIVITY TOLERANCE: Activity tolerance: fair  FUNCTIONAL OUTCOME MEASURES:  04/01/24 - 8.3   Total score = sum of the activity scores/number of activities Minimum detectable change (90%CI) for average score = 2 points Minimum detectable change (90%CI) for single activity score = 3 points  UPPER EXTREMITY ROM:  Impaired R supination ~ neutral  though able to improve to [facility] with increased time and awareness.     BUE - WNL though weaker on R vs L  HAND FUNCTION: Grip strength: Right: 81.5 lbs; Left: 97.8 lbs  COORDINATION: 9 Hole Peg test: Right: 47 sec; Left: 33 sec - mild tremor in R hand  SENSATION: WFL  EDEMA: none reported or observed  MUSCLE TONE:WFL  COGNITION: Overall cognitive status: Impaired - see ST for further details  VISION: Subjective report: Pt reports vision fluctuates including episodes of double vision and L visual field cut Baseline vision: Bifocals, Wears glasses all the time, and ST bifocals Visual history: cataracts and corrective eye surgery  VISION ASSESSMENT: WFL distance and near acuity; ~45* L visual field  PERCEPTION: Impaired: secondary to visual impairments; pt required cues to locate hand sanitizer on table, reaching to Kleenex originally  PRAXIS: Impaired: Motor planning and potential ideational praxis  OBSERVATIONS: Pt appears well-kept. Glasses hanging on shirt. Pt able to self-propel manual wheelchair.  TREATMENT :  Therapist reviewed goals with patient and updated patient progression. Additional functional limitation identified, goal added. Objective measures assessed as noted in Goals section to determine progression towards goals. OT educated pt on use of long handled sponge with RUE to wash back. Pt demonstrated. OT also reviewed towel or dowel stretches for IR of R shoulder as needed to improve performance with washing back and to limit need of assistance.   PATIENT EDUCATION: Education details: see today's tx above Person educated: Patient Education method: Explanation, Demonstration, and Verbal cues Education comprehension: verbalized understanding, returned demonstration, verbal cues required, and needs further education  HOME EXERCISE  PROGRAM: 03/04/24 - tremor compensation strategies 03/11/2024: vision strategies and visual scanning 03/14/24 - vision HEP (see pt instructions) 04/04/24 - red theraband RUE - Access Code: [email or web address]  GOALS:  SHORT TERM GOALS: Target date: 03/27/2024  Patient will demonstrate initial RUE HEP with 25% verbal cues or less for proper execution. 04/01/24 - Pt benefited from v/c.  Goal status: MET  2.  Patient will independently recall at least 2 compensatory strategies for visual impairment without cueing.  04/01/24 - With min v/c, pt recalled turning head to look and looking for the edges of objects.  04/04/24 - Pt ind recalled using systematic scanning method and turning head to look. Goal status: MET  3.  Patient will demonstrate at least 85% accuracy with environmental visual scanning to assist with ADLs and IADLS including potential driving considerations.  04/01/24 - Pt completed visual scanning task in clinic with 80% accuracy while self-propelling w/c. 04/04/24 - Pt identified 100% of items in Search and Find visual scanning activity book with extra time, including items on L side. 04/11/2024: 97.5% accuracy with letter cancellation; 80% accuracy with hallway scanning Goal status: IN Progress  4.  Pt will independently recall at least 2 tremor reduction strategies.   Baseline:  03/04/24 - OT educated pt on tremor compensation strategies. Pt returned demo. 04/01/24 - Pt ind recalled using weighted pen and stabilize arms on table. Goal status: MET  LONG TERM GOALS: Target date: 04/26/2024  Patient will demonstrate updated RUE HEP with visual handouts only for proper execution.  Goal status: IN Progress  2.  03/04/24 revised - Patient will report at least two-point increase in average PSFS score or at least three-point increase in a single activity score indicating functionally significant improvement given minimum detectable change. Baseline: PSFS - 3.3 total score (See above for individual activity  scores)  04/01/24 - 8.3 Goal status: MET  3.  Patient will demo improved FM coordination as evidenced by completing nine-hole peg with use of R in 35 seconds or less. Baseline: Right: 47 secs 04/01/24 - Trial 1 - Right: 57 seconds, Trial 2 - Right: 38 seconds 04/11/2024: 45 seconds; after positioning to right side of body because of decline in L visual acuity - 35 seconds Goal status: IN Progress  4.  Patient will demonstrate at least 88 lbs R grip strength as needed to open jars and other containers. Baseline: Right: 81.5 lbs 04/01/24 - Right: 85.5, 81.1, 73.6 (80.06 lbs average) 04/11/2024: 97.6 lbs Goal status: MET  5.  Pt to demonstrate improved R shoulder IR/use of AD to wash back mod I. (Added 04/11/2024) Baseline: dependent Goal status: INITIAL  ASSESSMENT:  CLINICAL IMPRESSION: This 10 th progress note is for dates: 02/28/2024 to 04/11/2024. Pt has met 3/4 STGs and 2/5 LTGs (with 1 new goal added today). Pt making progress towards goals as expected and continues  to benefit from skilled OT services in the outpatient setting to work towards remaining goals or until max rehab potential is met.  PERFORMANCE DEFICITS: in functional skills including ADLs, IADLs, coordination, proprioception, sensation, strength, Fine motor control, decreased knowledge of precautions, decreased knowledge of use of DME, vision, and UE functional use, cognitive skills including attention, memory, and problem solving, and psychosocial skills including coping strategies.   IMPAIRMENTS: are limiting patient from ADLs, IADLs, rest and sleep, leisure, and social participation.   CO-MORBIDITIES: may have co-morbidities  that affects occupational performance. Patient will benefit from skilled OT to address above impairments and improve overall function.  REHAB POTENTIAL: Fair given comorbidities  PLAN:  OT FREQUENCY: 2x/week  OT DURATION: 8 weeks  PLANNED INTERVENTIONS: 97168 OT Re-evaluation, 97535 self care/ADL  training, 16109 therapeutic exercise, 97530 therapeutic activity, 97112 neuromuscular re-education, 97140 manual therapy, visual/perceptual remediation/compensation, coping strategies training, patient/family education, and DME and/or AE instructions  RECOMMENDED OTHER SERVICES: N/A for this visit  CONSULTED AND AGREED WITH PLAN OF CARE: Patient and family member/caregiver  PLAN FOR NEXT SESSION: d/c? - washing back Review PRN - tremor reduction strategies, vision compensation and scanning activities, diplopia HEP, oculomotor saccades  Visual comp strategies/handout Continued progression of FM coordination HEP and/or activities, continue writing activities  IR Review R theraband HEP PRN Did pt f/u with neurology/ophthalmology/PCP?  Altamease Asters, OT 04/11/2024, 5:45 PM

## 2024-04-14 NOTE — Therapy (Deleted)
 OUTPATIENT OCCUPATIONAL THERAPY NEURO TREATMENT AND PROGRESS NOTE  Patient Name: [name] MRN: [id number] DOB:Jan 06, 1976, 48 y.o., male Today's Date: 04/14/2024  PCP: Sandra Crouch, MD REFERRING PROVIDER: Arcadio Knuckles, MD  END OF SESSION:   Past Medical History:  Diagnosis Date   AMPUTATION, BELOW KNEE, 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   Diabetes mellitus without complication [facility])    no since pancreas transplant   Dialysis patient [facility]) 04/18/2012   "[facility]; [employer], [place], Sat"   Gastroparesis    Gastropathy    GERD (gastroesophageal reflux disease)    Hypertension    MRSA infection    over 10 years ago per patient. in legs   Past Surgical History:  Procedure Laterality Date   AV FISTULA PLACEMENT  08/2011   left upper arm   BELOW KNEE LEG AMPUTATION  "it's been awhile"   bilaterally   CATARACT EXTRACTION  ~ 2011   right   COMBINED KIDNEY-PANCREAS TRANSPLANT  2014   ESOPHAGOGASTRODUODENOSCOPY N/A 12/30/2016   Procedure: ESOPHAGOGASTRODUODENOSCOPY (EGD);  Surgeon: Alvis Jourdain, MD;  Location: [facility] ENDOSCOPY;  Service: Endoscopy;  Laterality: N/A;   OLECRANON BURSECTOMY Right 06/23/2020   Procedure: RIGHT ELBOW EXCISION OLECRANON BURSITIS;  Surgeon: Timothy Ford, MD;  Location: [facility];  Service: Orthopedics;  Laterality: Right;   Patient Active Problem List   Diagnosis Date Noted   Mass of right side of neck 02/20/2024   Ischemic cerebrovascular accident (CVA) due to global hypoperfusion with watershed infarction [facility]) 09/20/2023   Hypertension secondary to other renal disorders 09/18/2023   PAD (peripheral artery disease) (HCC) 09/04/2023   Diabetes mellitus type II, controlled (HCC) 09/04/2023   Abnormal electrocardiogram (ECG) (EKG) 07/20/2023   Chronic pain  07/04/2023   Tobacco dependence 07/04/2023   Marijuana dependence (HCC) 07/04/2023   Screen for colon cancer 01/10/2022   Balanitis 01/10/2022   Encounter for general adult medical examination with abnormal findings 01/10/2022   Screening for prostate cancer 01/10/2022   Healthcare maintenance 08/26/2016   Impaired mobility and activities of daily living 07/03/2014   Renal transplant, status post 07/19/2013   History of pancreas transplant (HCC) 07/19/2013   Immunosuppressed status (HCC) 06/27/2013   H/O pancreas transplant (HCC) 06/27/2013   Diabetic gastroparesis associated with type 1 diabetes mellitus (HCC) 05/08/2013   S/P bilateral BKA (below knee amputation) (HCC) 08/17/2012   Metabolic bone disease 07/20/2011   ESRD (end stage renal disease) (HCC) 11/17/2009   GERD 03/14/2007   Dyslipidemia, goal LDL below 100 02/22/2007   Major depressive disorder, recurrent episode (HCC) 02/22/2007   POST TRAUMATIC STRESS DISORDER 02/22/2007   IMPOTENCE, 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    ONSET DATE: 02/10/2024 (Date of referral)  REFERRING DIAG: Z89.512,Z89.511 (ICD-10-CM) - S/P bilateral BKA (below knee amputation) (HCC) Z74.09,Z78.9 (ICD-10-CM) - Impaired mobility and activities of daily living I63.9 (ICD-10-CM) - Ischemic cerebrovascular accident (CVA) due to global hypoperfusion with watershed infarction  THERAPY DIAG:  No diagnosis found.  Rationale for Evaluation and Treatment: Rehabilitation  SUBJECTIVE:   SUBJECTIVE STATEMENT: From eval: 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   Pt reports Vision in L eye is worse. Able to see shapes but nothing is clear. He feels extra BP med midday is helping.   He  is seeing PCP on 4/30 with hopes they might be able to get into ophthalmologist sooner.   Pt accompanied by: self  PERTINENT HISTORY:  PAD, HTN, Strokes, s/p Kidney and Pancreas Transplant, Carotid Stenosis, T1DM, ESRD, bilateral BKA amputee   PRECAUTIONS: None  WEIGHT BEARING RESTRICTIONS:  No  PAIN:  Are you having pain? 5/10 RLE > LLE  FALLS: Has patient fallen in last 6 months? Yes. Number of falls 1  LIVING ENVIRONMENT: Lives with: lives with their spouse Lives in: House/apartment Stairs: Yes: Internal: 15 steps; on right going up and External: 5 steps; bilateral but cannot reach both Has following equipment at home: Quad cane small base, Walker - 2 wheeled, Wheelchair (manual), and shower chair  PLOF: Independent; was driving;   PATIENT GOALS: get back to PLOF  OBJECTIVE:  Note: Objective measures were completed at Evaluation unless otherwise noted.  HAND DOMINANCE: Right  ADLs: Overall ADLs: mod I   IADLs: Light housekeeping: sweeping, gathers up laundry, dishes  Meal Prep: mod I Community mobility: dependent Medication management: setup Handwriting: 50% legible, Increased time, and due to cognitive impairments  MOBILITY STATUS: Independent - manual wheelchair  ACTIVITY TOLERANCE: Activity tolerance: fair  FUNCTIONAL OUTCOME MEASURES:  04/01/24 - 8.3   Total score = sum of the activity scores/number of activities Minimum detectable change (90%CI) for average score = 2 points Minimum detectable change (90%CI) for single activity score = 3 points  UPPER EXTREMITY ROM:  Impaired R supination ~ neutral though able to improve to [facility] with increased time and awareness.     BUE - WNL though weaker on R vs L  HAND FUNCTION: Grip strength: Right: 81.5 lbs; Left: 97.8 lbs  COORDINATION: 9 Hole Peg test: Right: 47 sec; Left: 33 sec - mild tremor in R hand  SENSATION: WFL  EDEMA: none reported or observed  MUSCLE TONE:WFL  COGNITION: Overall cognitive status: Impaired - see ST for further details  VISION: Subjective report: Pt reports vision fluctuates including episodes of double vision and L visual field cut Baseline vision: Bifocals, Wears glasses all the time, and ST bifocals Visual history: cataracts and corrective eye  surgery  VISION ASSESSMENT: WFL distance and near acuity; ~45* L visual field  PERCEPTION: Impaired: secondary to visual impairments; pt required cues to locate hand sanitizer on table, reaching to Kleenex originally  PRAXIS: Impaired: Motor planning and potential ideational praxis  OBSERVATIONS: Pt appears well-kept. Glasses hanging on shirt. Pt able to self-propel manual wheelchair.                                                                                                                            TREATMENT :  Therapist reviewed goals with patient and updated patient progression. Additional functional limitation identified, goal added. Objective measures assessed as noted in Goals section to determine progression towards goals. OT educated pt on use of long handled sponge with RUE to wash back. Pt demonstrated. OT also reviewed towel or  dowel stretches for IR of R shoulder as needed to improve performance with washing back and to limit need of assistance.   PATIENT EDUCATION: Education details: see today's tx above Person educated: Patient Education method: Explanation, Demonstration, and Verbal cues Education comprehension: verbalized understanding, returned demonstration, verbal cues required, and needs further education  HOME EXERCISE PROGRAM: 03/04/24 - tremor compensation strategies 03/11/2024: vision strategies and visual scanning 03/14/24 - vision HEP (see pt instructions) 04/04/24 - red theraband RUE - Access Code: [email or web address]  GOALS:  SHORT TERM GOALS: Target date: 03/27/2024  Patient will demonstrate initial RUE HEP with 25% verbal cues or less for proper execution. 04/01/24 - Pt benefited from v/c.  Goal status: MET  2.  Patient will independently recall at least 2 compensatory strategies for visual impairment without cueing.  04/01/24 - With min v/c, pt recalled turning head to look and looking for the edges of objects.  04/04/24 - Pt ind recalled using systematic  scanning method and turning head to look. Goal status: MET  3.  Patient will demonstrate at least 85% accuracy with environmental visual scanning to assist with ADLs and IADLS including potential driving considerations.  04/01/24 - Pt completed visual scanning task in clinic with 80% accuracy while self-propelling w/c. 04/04/24 - Pt identified 100% of items in Search and Find visual scanning activity book with extra time, including items on L side. 04/11/2024: 97.5% accuracy with letter cancellation; 80% accuracy with hallway scanning Goal status: IN Progress  4.  Pt will independently recall at least 2 tremor reduction strategies.   Baseline:  03/04/24 - OT educated pt on tremor compensation strategies. Pt returned demo. 04/01/24 - Pt ind recalled using weighted pen and stabilize arms on table. Goal status: MET  LONG TERM GOALS: Target date: 04/26/2024  Patient will demonstrate updated RUE HEP with visual handouts only for proper execution.  Goal status: IN Progress  2.  03/04/24 revised - Patient will report at least two-point increase in average PSFS score or at least three-point increase in a single activity score indicating functionally significant improvement given minimum detectable change. Baseline: PSFS - 3.3 total score (See above for individual activity scores)  04/01/24 - 8.3 Goal status: MET  3.  Patient will demo improved FM coordination as evidenced by completing nine-hole peg with use of R in 35 seconds or less. Baseline: Right: 47 secs 04/01/24 - Trial 1 - Right: 57 seconds, Trial 2 - Right: 38 seconds 04/11/2024: 45 seconds; after positioning to right side of body because of decline in L visual acuity - 35 seconds Goal status: IN Progress  4.  Patient will demonstrate at least 88 lbs R grip strength as needed to open jars and other containers. Baseline: Right: 81.5 lbs 04/01/24 - Right: 85.5, 81.1, 73.6 (80.06 lbs average) 04/11/2024: 97.6 lbs Goal status: MET  5.  Pt to  demonstrate improved R shoulder IR/use of AD to wash back mod I. (Added 04/11/2024) Baseline: dependent Goal status: INITIAL  ASSESSMENT:  CLINICAL IMPRESSION: This 10 th progress note is for dates: 02/28/2024 to 04/11/2024. Pt has met 3/4 STGs and 2/5 LTGs (with 1 new goal added today). Pt making progress towards goals as expected and continues to benefit from skilled OT services in the outpatient setting to work towards remaining goals or until max rehab potential is met.  PERFORMANCE DEFICITS: in functional skills including ADLs, IADLs, coordination, proprioception, sensation, strength, Fine motor control, decreased knowledge of precautions, decreased knowledge of use of DME, vision, and UE functional  use, cognitive skills including attention, memory, and problem solving, and psychosocial skills including coping strategies.   IMPAIRMENTS: are limiting patient from ADLs, IADLs, rest and sleep, leisure, and social participation.   CO-MORBIDITIES: may have co-morbidities  that affects occupational performance. Patient will benefit from skilled OT to address above impairments and improve overall function.  REHAB POTENTIAL: Fair given comorbidities  PLAN:  OT FREQUENCY: 2x/week  OT DURATION: 8 weeks  PLANNED INTERVENTIONS: 97168 OT Re-evaluation, 97535 self care/ADL training, 16109 therapeutic exercise, 97530 therapeutic activity, 97112 neuromuscular re-education, 97140 manual therapy, visual/perceptual remediation/compensation, coping strategies training, patient/family education, and DME and/or AE instructions  RECOMMENDED OTHER SERVICES: N/A for this visit  CONSULTED AND AGREED WITH PLAN OF CARE: Patient and family member/caregiver  PLAN FOR NEXT SESSION: d/c? - washing back Review PRN - tremor reduction strategies, vision compensation and scanning activities, diplopia HEP, oculomotor saccades  Visual comp strategies/handout Continued progression of FM coordination HEP and/or activities,  continue writing activities  IR Review R theraband HEP PRN Did pt f/u with neurology/ophthalmology/PCP?  Altamease Asters, OT 04/14/2024, 9:26 PM

## 2024-04-15 ENCOUNTER — Ambulatory Visit: Admitting: Speech Pathology

## 2024-04-15 ENCOUNTER — Ambulatory Visit

## 2024-04-15 ENCOUNTER — Ambulatory Visit: Admitting: Occupational Therapy

## 2024-04-16 ENCOUNTER — Other Ambulatory Visit: Payer: Self-pay

## 2024-04-16 DIAGNOSIS — G894 Chronic pain syndrome: Secondary | ICD-10-CM | POA: Diagnosis not present

## 2024-04-16 DIAGNOSIS — G546 Phantom limb syndrome with pain: Secondary | ICD-10-CM | POA: Diagnosis not present

## 2024-04-16 DIAGNOSIS — S88119A Complete traumatic amputation at level between knee and ankle, unspecified lower leg, initial encounter: Secondary | ICD-10-CM | POA: Diagnosis not present

## 2024-04-16 DIAGNOSIS — Z9483 Pancreas transplant status: Secondary | ICD-10-CM | POA: Diagnosis not present

## 2024-04-16 DIAGNOSIS — G8191 Hemiplegia, unspecified affecting right dominant side: Secondary | ICD-10-CM | POA: Diagnosis not present

## 2024-04-16 DIAGNOSIS — Z79899 Other long term (current) drug therapy: Secondary | ICD-10-CM | POA: Diagnosis not present

## 2024-04-16 DIAGNOSIS — Z89511 Acquired absence of right leg below knee: Secondary | ICD-10-CM | POA: Diagnosis not present

## 2024-04-16 DIAGNOSIS — Z89512 Acquired absence of left leg below knee: Secondary | ICD-10-CM | POA: Diagnosis not present

## 2024-04-16 DIAGNOSIS — G890 Central pain syndrome: Secondary | ICD-10-CM | POA: Diagnosis not present

## 2024-04-16 DIAGNOSIS — N1831 Chronic kidney disease, stage 3a: Secondary | ICD-10-CM | POA: Diagnosis not present

## 2024-04-16 MED ORDER — NALOXONE HCL 4 MG/0.1ML NA LIQD
NASAL | 2 refills | Status: AC
  Filled 2024-04-16: qty 2, 30d supply, fill #0
  Filled 2024-04-17: qty 2, 1d supply, fill #0

## 2024-04-16 MED ORDER — OXYCODONE HCL 15 MG PO TABS
15.0000 mg | ORAL_TABLET | Freq: Four times a day (QID) | ORAL | 0 refills | Status: DC
  Filled 2024-04-16 – 2024-04-22 (×2): qty 120, 30d supply, fill #0

## 2024-04-17 ENCOUNTER — Ambulatory Visit: Admitting: Speech Pathology

## 2024-04-17 ENCOUNTER — Ambulatory Visit: Admitting: Occupational Therapy

## 2024-04-17 ENCOUNTER — Other Ambulatory Visit: Payer: Self-pay

## 2024-04-17 ENCOUNTER — Ambulatory Visit: Admitting: Physical Therapy

## 2024-04-17 DIAGNOSIS — Z79899 Other long term (current) drug therapy: Secondary | ICD-10-CM | POA: Diagnosis not present

## 2024-04-18 ENCOUNTER — Encounter: Payer: 59 | Admitting: Physical Medicine & Rehabilitation

## 2024-04-18 ENCOUNTER — Encounter: Payer: Self-pay | Admitting: Gastroenterology

## 2024-04-18 ENCOUNTER — Ambulatory Visit (INDEPENDENT_AMBULATORY_CARE_PROVIDER_SITE_OTHER): Admitting: Gastroenterology

## 2024-04-18 VITALS — BP 126/70 | HR 64 | Ht 73.0 in

## 2024-04-18 DIAGNOSIS — R1310 Dysphagia, unspecified: Secondary | ICD-10-CM | POA: Diagnosis not present

## 2024-04-18 MED ORDER — FLUCONAZOLE 200 MG PO TABS: 200.0000 mg | ORAL_TABLET | Freq: Every day | ORAL | 0 refills | Status: DC

## 2024-04-18 NOTE — Patient Instructions (Signed)
 We have sent the following medications to your pharmacy for you to pick up at your convenience: Fluconazole  200 mg daily for 14 days.   You have been scheduled for a Barium Esophogram at [facility] (1st floor of the hospital) on Monday 05/06/24 at 9 am. Please arrive 30 minutes prior to your appointment for registration. Make certain not to have anything to eat or drink 3 hours prior to your test. If you need to reschedule for any reason, please contact radiology at [phone] to do so. __________________________________________________________________ A barium swallow is an examination that concentrates on views of the esophagus. This tends to be a double contrast exam (barium and two liquids which, when combined, create a gas to distend the wall of the oesophagus) or single contrast (non-ionic iodine based). The study is usually tailored to your symptoms so a good history is essential. Attention is paid during the study to the form, structure and configuration of the esophagus, looking for functional disorders (such as aspiration, dysphagia, achalasia, motility and reflux) EXAMINATION You may be asked to change into a gown, depending on the type of swallow being performed. A radiologist and radiographer will perform the procedure. The radiologist will advise you of the type of contrast selected for your procedure and direct you during the exam. You will be asked to stand, sit or lie in several different positions and to hold a small amount of fluid in your mouth before being asked to swallow while the imaging is performed .In some instances you may be asked to swallow barium coated marshmallows to assess the motility of a solid food bolus. The exam can be recorded as a digital or video fluoroscopy procedure. POST PROCEDURE It will take 1-2 days for the barium to pass through your system. To facilitate this, it is important, unless otherwise directed, to increase your fluids for the next  24-48hrs and to resume your normal diet.  This test typically takes about 30 minutes to perform. __________________________________________________________________________________

## 2024-04-18 NOTE — Progress Notes (Addendum)
 04/18/2024 [name] [id number] 11/30/1976   HISTORY OF PRESENT ILLNESS: This is a 48 year old male who is a patient of Dr. Jadene Maxwell.  He has bilateral below the knee amputations, history of dialysis but had a combined pancreas kidney transplant 2014.  He is on immunosuppressants/antirejection medications with prednisone  and mycophenolate  and takes Bactrim  3 times per week.  Had a stroke in September/October 2024.  He is here today with his wife for complaints of dysphagia.  Says that he has been having difficulty swallowing.  Feels like food is not going down, having issues with swallowing pills.  Seems to be a problem since his stroke, getting worse.  He had a modified barium swallow study with speech back in the fall and has continued to follow with speech pathology.  Tells me he feels like the food sits in his throat.  He also recently had a carotid ultrasound in January and was found to have mass on the right side of his neck.  Has an appoint with ENT in the near future.  Is on pantoprazole  40 mg twice daily.  EGD 06/2023:  - Esophageal plaques were found, consistent with Candidiasis. Biopsied. - The examination was otherwise normal.  Surgical [P], esophageal bx SQUAMOUS MUCOSA WITH MARKED ACUTE INFLAMMATION. 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   Diabetes mellitus without complication [facility])    no since pancreas transplant   Dialysis patient [facility]) 04/18/2012   "[facility]; [place], Sat"   Gastroparesis    Gastropathy    GERD (gastroesophageal reflux disease)    Hypertension    MRSA infection    over 10 years ago per patient. in legs   Past Surgical History:   Procedure Laterality Date   AV FISTULA PLACEMENT  08/2011   left upper arm   BELOW KNEE LEG AMPUTATION  "it's been awhile"   bilaterally   CATARACT EXTRACTION  ~ 2011   right   COMBINED KIDNEY-PANCREAS TRANSPLANT  2014   ESOPHAGOGASTRODUODENOSCOPY N/A 12/30/2016   Procedure: ESOPHAGOGASTRODUODENOSCOPY (EGD);  Surgeon: Alvis Jourdain, MD;  Location: [facility] ENDOSCOPY;  Service: Endoscopy;  Laterality: N/A;   OLECRANON BURSECTOMY Right 06/23/2020   Procedure: RIGHT ELBOW EXCISION OLECRANON BURSITIS;  Surgeon: Timothy Ford, MD;  Location: [facility];  Service: Orthopedics;  Laterality: Right;    reports that he quit smoking about 21 months ago. His smoking use included cigars. He has never used smokeless tobacco. He reports that he does not currently use drugs after having used the following drugs: Marijuana. Frequency: 3.00 times per week. He reports that he does not drink alcohol. family history includes Diabetes in his maternal grandmother, mother, paternal grandmother, and another family member; Hypertension in his mother and another family member; Kidney disease in his mother; Lung cancer in his maternal aunt. Allergies  Allergen Reactions   Belatacept  Anaphylaxis    hypotensive with altered reaction and required epinephrine .  Needed to be intubated for airway protection.   Amlodipine  Rash   Lisinopril Rash      Outpatient Encounter Medications as of 04/18/2024  Medication Sig   acetaminophen  (TYLENOL ) 325  MG tablet Take 1-2 tablets (325-650 mg total) by mouth every 4 (four) hours as needed for mild pain (pain score 1-3).   aspirin  EC 81 MG tablet Take 1 tablet (81 mg total) by mouth daily. Swallow whole.   atorvastatin  (LIPITOR) 40 MG tablet Take 1 tablet (40 mg total) by mouth daily.   carvedilol  (COREG ) 12.5 MG tablet Take 1 tablet (12.5 mg total) by mouth 2 (two) times daily with a meal.   cycloSPORINE  modified (NEORAL ) 100 MG capsule Take 100 mg by mouth 2 (two) times  daily. Taken with 50 mg tablet   cycloSPORINE  modified (NEORAL ) 50 MG capsule Take 1 capsule by mouth two times daily, Take along with 100 mg twice a day   hydrALAZINE  (APRESOLINE ) 50 MG tablet Take 1 tablet (50 mg total) by mouth 3 (three) times daily.   mycophenolate  (MYFORTIC ) 180 MG EC tablet Take 1 tablet (180 mg total) by mouth 2 (two) times daily.   naloxone  (NARCAN ) nasal spray 4 mg/0.1 mL Inhale 1 (one) Spray if poorly responding or lips turning blue   oxyCODONE  (ROXICODONE ) 15 MG immediate release tablet Take 1 tablet (15 mg total) by mouth in the morning, at noon, in the evening, and at bedtime if needed for pain   Oxycodone  HCl 10 MG TABS Take 0.5-1 tablets (5-10 mg total) by mouth 5 (five) times daily as needed,   pantoprazole  (PROTONIX ) 40 MG tablet TAKE 1 TABLET(40 MG) BY MOUTH TWICE DAILY   predniSONE  (DELTASONE ) 5 MG tablet Take 5 mg by mouth daily.   sulfamethoxazole -trimethoprim  (BACTRIM ) 400-80 MG tablet Take 1 tablet by mouth every Monday, Wednesday, and Friday.   terazosin  (HYTRIN ) 2 MG capsule Take 2 capsules (4 mg total) by mouth at bedtime.   No facility-administered encounter medications on file as of 04/18/2024.    REVIEW OF SYSTEMS  : All other systems reviewed and negative except where noted in the History of Present Illness.   PHYSICAL EXAM: BP 126/70   Pulse 64   Ht 6\' 1"  (1.854 m)   BMI 28.63 kg/m  General: Well developed AA male in no acute distress Head: Normocephalic and atraumatic Eyes:  Sclerae anicteric, conjunctiva pink. Ears: Normal auditory acuity. Mouth: Tongue with white coating, what appears to be thrush. Lungs: Clear throughout to auscultation; no W/R/R. Heart: Regular rate and rhythm Musculoskeletal: Symmetrical with no gross deformities  Skin: No lesions on visible extremities Extremities: B/L BKA. Neurological: Alert oriented x 4, grossly non-focal Psychological:  Alert and cooperative. Normal mood and affect  ASSESSMENT AND  PLAN: *Dysphagia: Having dysphagia to solid food and pills.  Hard to know if it sounds more like oropharyngeal dysphagia versus esophageal dysphagia.  Had an EGD July 2024 with only esophageal candidiasis.  He had a stroke in September/October 2024 and things have been getting worse since then.  He had a modified barium swallow study and continues to follow with speech pathology.  He also recently was found to have a right neck mass during carotid ultrasounds and has an appointment with the ENT.  Will perform an esophagram with tablet to evaluate from gastro standpoint.  ? Compression on the esophagus from this mass.  Does appear that he has thrush on his tongue today and he is immunosuppressed/transplant patient on prednisone  and mycophenolate , takes Bactrim  3 times a week.  Will treat with fluconazole  200 mg daily for 14 days.   CC:  Arcadio Knuckles, MD   Gastroenterology Primary Physician:  Carotid US  result - ENT  eval 5/8. Await that and ba swallow + response to fluconazole    01/17/24  Summary:  Right Carotid: The extracranial vessels were near-normal with only minimal  wall                thickening or plaque. Solid appearing heterogenous,  hypoechoic                structure seen in right side of neck measuring 2.17 x 1.69  x 1.11                 cm. Would recommend further imaging if clinically  indicated.   Kenney Peacemaker, MD, [facility]

## 2024-04-20 ENCOUNTER — Other Ambulatory Visit: Payer: Self-pay | Admitting: Internal Medicine

## 2024-04-20 DIAGNOSIS — I151 Hypertension secondary to other renal disorders: Secondary | ICD-10-CM

## 2024-04-22 ENCOUNTER — Other Ambulatory Visit: Payer: Self-pay

## 2024-04-22 ENCOUNTER — Telehealth: Payer: Self-pay | Admitting: *Deleted

## 2024-04-22 MED ORDER — NYSTATIN 100000 UNIT/ML MT SUSP: OROMUCOSAL | 0 refills | Status: AC

## 2024-04-22 NOTE — Telephone Encounter (Signed)
 Zehr, Jessica D, PA-C  Shayanna Thatch L, CMA; Emaline Handsome A, CMA Please let the patient and his wife know that we received an interaction warning back about his cyclosporine  with the fluconazole  that we prescribed.  There is risk that the fluconazole  could increase the levels of his immunosuppressants, namely cyclosporine .  Options would be for them to reach out to his transplant team to see if they are ok with him taking the fluconazole  or we can change it to nystatin mouthwash (to swish and swallow).  Let me know.  If he has already started it then would have him discontinue taking anymore until he speaks with them or we switch it and he stops the fluconazole  altogether.  Thank you,  Jess

## 2024-04-22 NOTE — Telephone Encounter (Signed)
 Spoke with patient's wife and they have not start Fluconazole . She agreed with switching to Nystatin. Please advise.

## 2024-04-22 NOTE — Telephone Encounter (Signed)
 Script sent to pharmacy.

## 2024-04-23 ENCOUNTER — Encounter: Payer: Self-pay | Admitting: Physical Medicine & Rehabilitation

## 2024-04-23 ENCOUNTER — Other Ambulatory Visit: Payer: Self-pay | Admitting: Internal Medicine

## 2024-04-23 ENCOUNTER — Encounter: Attending: Physical Medicine & Rehabilitation | Admitting: Physical Medicine & Rehabilitation

## 2024-04-23 VITALS — BP 172/92 | HR 73 | Ht 73.0 in

## 2024-04-23 DIAGNOSIS — I151 Hypertension secondary to other renal disorders: Secondary | ICD-10-CM

## 2024-04-23 DIAGNOSIS — N183 Chronic kidney disease, stage 3 unspecified: Secondary | ICD-10-CM | POA: Diagnosis not present

## 2024-04-23 DIAGNOSIS — Z940 Kidney transplant status: Secondary | ICD-10-CM | POA: Diagnosis not present

## 2024-04-23 DIAGNOSIS — H53132 Sudden visual loss, left eye: Secondary | ICD-10-CM

## 2024-04-23 DIAGNOSIS — E785 Hyperlipidemia, unspecified: Secondary | ICD-10-CM | POA: Diagnosis not present

## 2024-04-23 DIAGNOSIS — I129 Hypertensive chronic kidney disease with stage 1 through stage 4 chronic kidney disease, or unspecified chronic kidney disease: Secondary | ICD-10-CM | POA: Diagnosis not present

## 2024-04-23 NOTE — Progress Notes (Signed)
 Subjective:    Patient ID: [name], male    DOB: [birth year], 48 y.o.   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  He has had no trauma to that area he had an ER visit for elevated blood pressure and headaches on 03/25/2024.  Workup including MRI of the brain showed no new event. Fell while walking to car while using walker.  No injury needed help getting up.  Left eye vision loss onset a couple days before ED visit for HA and elevated BP, Old CVA but no new infarct seen with Left fronto temporal encephalomalacia, and subcortical white m thank atter disease  Needs assist with bathing Mod I with dressing  Hydralazine  just increased per Dr Rochelle Chu , PCP, due to elevated BP appt with PCP in am   Pain Inventory Average Pain 7 Pain Right Now 9 My pain is constant and aching  In the last 24 hours, has pain interfered with the following? General activity 7 Relation with others 7 Enjoyment of life 7 What TIME of day is your pain at its worst? morning , daytime, evening, night, and varies Sleep (in general) Fair  Pain is worse with: walking, bending, and standing Pain improves with: rest, pacing activities, and medication Relief from Meds:  good  Family History  Problem Relation Age of Onset   Hypertension Mother    Diabetes Mother    Kidney disease Mother    Diabetes Maternal Grandmother    Diabetes Paternal Grandmother    Diabetes Other    Hypertension Other    Lung cancer Maternal Aunt    Colon cancer Neg Hx    Esophageal cancer Neg Hx    Rectal cancer Neg Hx    Stomach cancer Neg Hx    Social History   Socioeconomic History   Marital status: Married    Spouse name: Not on file    Number of children: 1   Years of education: 11   Highest education level: Not on file  Occupational History   Occupation: Disability  Tobacco Use   Smoking status: Former    Types: Cigars    Quit date: 07/06/2022    Years since quitting: 1.8   Smokeless tobacco: Never   Tobacco comments:    black and mild  Vaping Use   Vaping status: Never Used  Substance and Sexual Activity   Alcohol use: No   Drug use: Not Currently    Frequency: 3.0 times per week    Types: Marijuana    Comment: Occasionally   Sexual activity: Yes    Partners: Female  Other Topics Concern   Not on file  Social History Narrative   Fun: Restore old cars    Social Drivers of Health   Financial Resource Strain: Low Risk  (09/11/2023)   Overall Financial Resource Strain (CARDIA)    Difficulty of Paying Living Expenses: Not hard at all  Food Insecurity: No Food Insecurity (09/29/2023)   Hunger Vital Sign    Worried About Running Out of Food in the Last Year: Never true    Ran Out of Food in the Last Year: Never true  Transportation Needs: No Transportation Needs (09/20/2023)   PRAPARE - Administrator, Civil Service (Medical):  No    Lack of Transportation (Non-Medical): No  Physical Activity: Inactive (09/11/2023)   Exercise Vital Sign    Days of Exercise per Week: 0 days    Minutes of Exercise per Session: 0 min  Stress: No Stress Concern Present (09/11/2023)   [employer] of Occupational Health - Occupational Stress Questionnaire    Feeling of Stress : Not at all  Social Connections: Socially Integrated (09/11/2023)   Social Connection and Isolation Panel [NHANES]    Frequency of Communication with Friends and Family: More than three times a week    Frequency of Social Gatherings with Friends and Family: More than three times a week    Attends Religious Services: More than 4 times per year    Active Member of Clubs or Organizations: Yes    Attends Engineer, structural: More  than 4 times per year    Marital Status: Married   Past Surgical History:  Procedure Laterality Date   AV FISTULA PLACEMENT  08/2011   left upper arm   BELOW KNEE LEG AMPUTATION  "it's been awhile"   bilaterally   CATARACT EXTRACTION  ~ 2011   right   COMBINED KIDNEY-PANCREAS TRANSPLANT  2014   ESOPHAGOGASTRODUODENOSCOPY N/A 12/30/2016   Procedure: ESOPHAGOGASTRODUODENOSCOPY (EGD);  Surgeon: Alvis Jourdain, MD;  Location: [facility] ENDOSCOPY;  Service: Endoscopy;  Laterality: N/A;   OLECRANON BURSECTOMY Right 06/23/2020   Procedure: RIGHT ELBOW EXCISION OLECRANON BURSITIS;  Surgeon: Timothy Ford, MD;  Location: [facility];  Service: Orthopedics;  Laterality: Right;   Past Surgical History:  Procedure Laterality Date   AV FISTULA PLACEMENT  08/2011   left upper arm   BELOW KNEE LEG AMPUTATION  "it's been awhile"   bilaterally   CATARACT EXTRACTION  ~ 2011   right   COMBINED KIDNEY-PANCREAS TRANSPLANT  2014   ESOPHAGOGASTRODUODENOSCOPY N/A 12/30/2016   Procedure: ESOPHAGOGASTRODUODENOSCOPY (EGD);  Surgeon: Alvis Jourdain, MD;  Location: [facility] ENDOSCOPY;  Service: Endoscopy;  Laterality: N/A;   OLECRANON BURSECTOMY Right 06/23/2020   Procedure: RIGHT ELBOW EXCISION OLECRANON BURSITIS;  Surgeon: Timothy Ford, MD;  Location: [facility];  Service: Orthopedics;  Laterality: Right;   Past Medical History:  Diagnosis Date   AMPUTATION, BELOW KNEE, 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   Diabetes mellitus without complication [facility])    no since pancreas transplant   Dialysis patient [facility]) 04/18/2012   "[facility]; [place], [place], Sat"   Gastroparesis    Gastropathy    GERD (gastroesophageal reflux disease)    Hypertension    MRSA infection    over 10 years ago per patient. in legs   BP (!) 181/89    Pulse 73   Ht 6\' 1"  (1.854 m)   SpO2 98%   BMI 28.63 kg/m   Opioid Risk Score:   Fall Risk Score:  `1  Depression screen PHQ 2/9     04/23/2024    1:34 PM 01/18/2024    2:57 PM 10/31/2023   11:15 AM 09/11/2023    3:36 PM 09/06/2022    4:08 PM 08/26/2016   10:04 AM 04/22/2016    9:41 AM  Depression screen PHQ 2/9  Decreased Interest 0 0 0 0 0 0 0  Down,  Depressed, Hopeless 0 0 0 0 0 0 0  PHQ - 2 Score 0 0 0 0 0 0 0  Altered sleeping   0 0     Tired, decreased energy   0 0     Change in appetite   0 0     Feeling bad or failure about yourself    0 0     Trouble concentrating   0 0     Moving slowly or fidgety/restless   1 0     Suicidal thoughts   0 0     PHQ-9 Score   1 0     Difficult doing work/chores   Somewhat difficult Not difficult at all       Review of Systems  Musculoskeletal:  Positive for back pain and gait problem.       Joint pain  All other systems reviewed and are negative.      Objective:   Physical Exam Extraocular muscles are intact Visual fields are intact with both eyes open however with right eye close the patient has reduced nasal field. No evidence of nystagmus no evidence of ptosis Eye is not injected, no scleral edema, Pupil is not dilated Motor strength is 4/5 in the right deltoid, bicep, tricep, grip, hip flexor Left side is 5/5 in left deltoid, bicep, tricep, grip, hip flexion MSK: Bilateral BKA prosthetics. Speech expressive aphasia, receptive skills appeared intact at least 4 simple commands. He communicates at a short sentence level.       Assessment & Plan:  #1.  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  Overall doing well from a functional standpoint 2.  Nasal visual field loss left eye occurred around a month ago.  He has an ophthalmology referral but no appointment until late May.  Given his symptoms he may benefit from retina specialist evaluation will make referral.  Do not feel that  his current symptoms are consistent with his old stroke.  Patient has a history of chronic hypertension

## 2024-04-24 ENCOUNTER — Ambulatory Visit: Admitting: Internal Medicine

## 2024-04-24 ENCOUNTER — Encounter: Payer: Self-pay | Admitting: Internal Medicine

## 2024-04-24 VITALS — BP 152/84 | HR 74 | Temp 98.6°F | Resp 16 | Ht 73.0 in | Wt 217.0 lb

## 2024-04-24 DIAGNOSIS — H53133 Sudden visual loss, bilateral: Secondary | ICD-10-CM | POA: Diagnosis not present

## 2024-04-24 DIAGNOSIS — N186 End stage renal disease: Secondary | ICD-10-CM | POA: Diagnosis not present

## 2024-04-24 DIAGNOSIS — I151 Hypertension secondary to other renal disorders: Secondary | ICD-10-CM

## 2024-04-24 DIAGNOSIS — E118 Type 2 diabetes mellitus with unspecified complications: Secondary | ICD-10-CM | POA: Diagnosis not present

## 2024-04-24 MED ORDER — TERAZOSIN HCL 2 MG PO CAPS: 4.0000 mg | ORAL_CAPSULE | Freq: Every day | ORAL | 0 refills | Status: DC

## 2024-04-24 NOTE — Patient Instructions (Signed)
 Hypertension, Adult High blood pressure (hypertension) is when the force of blood pumping through the arteries is too strong. The arteries are the blood vessels that carry blood from the heart throughout the body. Hypertension forces the heart to work harder to pump blood and may cause arteries to become narrow or stiff. Untreated or uncontrolled hypertension can lead to a heart attack, heart failure, a stroke, kidney disease, and other problems. A blood pressure reading consists of a higher number over a lower number. Ideally, your blood pressure should be below 120/80. The first ("top") number is called the systolic pressure. It is a measure of the pressure in your arteries as your heart beats. The second ("bottom") number is called the diastolic pressure. It is a measure of the pressure in your arteries as the heart relaxes. What are the causes? The exact cause of this condition is not known. There are some conditions that result in high blood pressure. What increases the risk? Certain factors may make you more likely to develop high blood pressure. Some of these risk factors are under your control, including: Smoking. Not getting enough exercise or physical activity. Being overweight. Having too much fat, sugar, calories, or salt (sodium) in your diet. Drinking too much alcohol. Other risk factors include: Having a personal history of heart disease, diabetes, high cholesterol, or kidney disease. Stress. Having a family history of high blood pressure and high cholesterol. Having obstructive sleep apnea. Age. The risk increases with age. What are the signs or symptoms? High blood pressure may not cause symptoms. Very high blood pressure (hypertensive crisis) may cause: Headache. Fast or irregular heartbeats (palpitations). Shortness of breath. Nosebleed. Nausea and vomiting. Vision changes. Severe chest pain, dizziness, and seizures. How is this diagnosed? This condition is diagnosed by  measuring your blood pressure while you are seated, with your arm resting on a flat surface, your legs uncrossed, and your feet flat on the floor. The cuff of the blood pressure monitor will be placed directly against the skin of your upper arm at the level of your heart. Blood pressure should be measured at least twice using the same arm. Certain conditions can cause a difference in blood pressure between your right and left arms. If you have a high blood pressure reading during one visit or you have normal blood pressure with other risk factors, you may be asked to: Return on a different day to have your blood pressure checked again. Monitor your blood pressure at home for 1 week or longer. If you are diagnosed with hypertension, you may have other blood or imaging tests to help your health care provider understand your overall risk for other conditions. How is this treated? This condition is treated by making healthy lifestyle changes, such as eating healthy foods, exercising more, and reducing your alcohol intake. You may be referred for counseling on a healthy diet and physical activity. Your health care provider may prescribe medicine if lifestyle changes are not enough to get your blood pressure under control and if: Your systolic blood pressure is above 130. Your diastolic blood pressure is above 80. Your personal target blood pressure may vary depending on your medical conditions, your age, and other factors. Follow these instructions at home: Eating and drinking  Eat a diet that is high in fiber and potassium, and low in sodium, added sugar, and fat. An example of this eating plan is called the DASH diet. DASH stands for Dietary Approaches to Stop Hypertension. To eat this way: Eat  plenty of fresh fruits and vegetables. Try to fill one half of your plate at each meal with fruits and vegetables. Eat whole grains, such as whole-wheat pasta, brown rice, or whole-grain bread. Fill about one  fourth of your plate with whole grains. Eat or drink low-fat dairy products, such as skim milk or low-fat yogurt. Avoid fatty cuts of meat, processed or cured meats, and poultry with skin. Fill about one fourth of your plate with lean proteins, such as fish, chicken without skin, beans, eggs, or tofu. Avoid pre-made and processed foods. These tend to be higher in sodium, added sugar, and fat. Reduce your daily sodium intake. Many people with hypertension should eat less than 1,500 mg of sodium a day. Do not drink alcohol if: Your health care provider tells you not to drink. You are pregnant, may be pregnant, or are planning to become pregnant. If you drink alcohol: Limit how much you have to: 0-1 drink a day for women. 0-2 drinks a day for men. Know how much alcohol is in your drink. In the [place] one drink equals one 12 oz bottle of beer (355 mL), one 5 oz glass of wine (148 mL), or one 1 oz glass of hard liquor (44 mL). Lifestyle  Work with your health care provider to maintain a healthy body weight or to lose weight. Ask what an ideal weight is for you. Get at least 30 minutes of exercise that causes your heart to beat faster (aerobic exercise) most days of the week. Activities may include walking, swimming, or biking. Include exercise to strengthen your muscles (resistance exercise), such as Pilates or lifting weights, as part of your weekly exercise routine. Try to do these types of exercises for 30 minutes at least 3 days a week. Do not use any products that contain nicotine or tobacco. These products include cigarettes, chewing tobacco, and vaping devices, such as e-cigarettes. If you need help quitting, ask your health care provider. Monitor your blood pressure at home as told by your health care provider. Keep all follow-up visits. This is important. Medicines Take over-the-counter and prescription medicines only as told by your health care provider. Follow directions carefully. Blood  pressure medicines must be taken as prescribed. Do not skip doses of blood pressure medicine. Doing this puts you at risk for problems and can make the medicine less effective. Ask your health care provider about side effects or reactions to medicines that you should watch for. Contact a health care provider if you: Think you are having a reaction to a medicine you are taking. Have headaches that keep coming back (recurring). Feel dizzy. Have swelling in your ankles. Have trouble with your vision. Get help right away if you: Develop a severe headache or confusion. Have unusual weakness or numbness. Feel faint. Have severe pain in your chest or abdomen. Vomit repeatedly. Have trouble breathing. These symptoms may be an emergency. Get help right away. Call 911. Do not wait to see if the symptoms will go away. Do not drive yourself to the hospital. Summary Hypertension is when the force of blood pumping through your arteries is too strong. If this condition is not controlled, it may put you at risk for serious complications. Your personal target blood pressure may vary depending on your medical conditions, your age, and other factors. For most people, a normal blood pressure is less than 120/80. Hypertension is treated with lifestyle changes, medicines, or a combination of both. Lifestyle changes include losing weight, eating a healthy,  low-sodium diet, exercising more, and limiting alcohol. This information is not intended to replace advice given to you by your health care provider. Make sure you discuss any questions you have with your health care provider. Document Revised: 10/19/2021 Document Reviewed: 10/19/2021 Elsevier Patient Education  2024 [employer].

## 2024-04-24 NOTE — Progress Notes (Signed)
 Subjective:  Patient ID: [name], male    DOB: [birth year]  Age: 48 y.o. MRN: [id number]  CC: Loss of Vision (Vision changes, ongoing for about 2 months, just the left eye. Can see a ring inside his eye, but cannot see anything else) and Hypertension (Discuss Hydralazine , blood pressures at home range from 140-180's over 80-90's)   HPI Davine Robbert Childes presents for f/up ----  He has experienced elevated blood pressure readings ranging from 140s to 180s over the past six weeks, accompanied by visual disturbances. He has not yet seen an eye doctor but has appointments scheduled. No headaches but some double vision. His blood pressure medication regimen includes hydralazine , carvedilol , and terazosin .  He has a history of a pancreas-kidney transplant and is monitored for prediabetes. His last A1c was 6.0 in December, indicating good control. He is not currently experiencing any symptoms related to diabetes.  Outpatient Medications Prior to Visit  Medication Sig Dispense Refill   acetaminophen  (TYLENOL ) 325 MG tablet Take 1-2 tablets (325-650 mg total) by mouth every 4 (four) hours as needed for mild pain (pain score 1-3).     aspirin  EC 81 MG tablet Take 1 tablet (81 mg total) by mouth daily. Swallow whole.     atorvastatin  (LIPITOR) 40 MG tablet Take 1 tablet (40 mg total) by mouth daily. 90 tablet 1   carvedilol  (COREG ) 12.5 MG tablet Take 1 tablet (12.5 mg total) by mouth 2 (two) times daily with a meal. 180 tablet 0   cycloSPORINE  modified (NEORAL ) 100 MG capsule Take 100 mg by mouth 2 (two) times daily. Taken with 50 mg tablet     cycloSPORINE  modified (NEORAL ) 50 MG capsule Take 1 capsule by mouth two times daily, Take along with 100 mg twice a day 60 capsule 5   hydrALAZINE  (APRESOLINE ) 50 MG tablet Take 1 tablet (50 mg total) by mouth 3 (three) times daily. 270 tablet 0   mycophenolate  (MYFORTIC ) 180 MG EC tablet Take 1 tablet (180 mg total) by mouth 2 (two) times  daily. 180 tablet 0   naloxone  (NARCAN ) nasal spray 4 mg/0.1 mL Inhale 1 (one) Spray if poorly responding or lips turning blue 2 each 2   nystatin  (MYCOSTATIN ) 100000 UNIT/ML suspension Swish and swallow 5 mls four times daily for 14 days 280 mL 0   oxyCODONE  (ROXICODONE ) 15 MG immediate release tablet Take 1 tablet (15 mg total) by mouth in the morning, at noon, in the evening, and at bedtime if needed for pain 120 tablet 0   Oxycodone  HCl 10 MG TABS Take 0.5-1 tablets (5-10 mg total) by mouth 5 (five) times daily as needed, 150 tablet 0   pantoprazole  (PROTONIX ) 40 MG tablet TAKE 1 TABLET(40 MG) BY MOUTH TWICE DAILY 180 tablet 0   predniSONE  (DELTASONE ) 5 MG tablet Take 5 mg by mouth daily.     sulfamethoxazole -trimethoprim  (BACTRIM ) 400-80 MG tablet Take 1 tablet by mouth every Monday, Wednesday, and Friday. 12 tablet 0   terazosin  (HYTRIN ) 2 MG capsule Take 2 capsules (4 mg total) by mouth at bedtime. 180 capsule 0   fluconazole  (DIFLUCAN ) 200 MG tablet Take 1 tablet (200 mg total) by mouth daily. (Patient not taking: Reported on 04/24/2024) 14 tablet 0   No facility-administered medications prior to visit.    ROS Review of Systems  Constitutional: Negative.  Negative for diaphoresis and fatigue.  HENT: Negative.    Eyes:  Positive for visual disturbance. Negative for photophobia.  Respiratory:  Negative  for cough, chest tightness, shortness of breath and wheezing.   Cardiovascular:  Negative for chest pain, palpitations and leg swelling.  Gastrointestinal:  Negative for abdominal pain, blood in stool, constipation, diarrhea, nausea and vomiting.  Genitourinary: Negative.  Negative for difficulty urinating.  Musculoskeletal:  Positive for gait problem.  Neurological:  Negative for dizziness, speech difficulty, light-headedness and headaches.  Hematological:  Negative for adenopathy. Does not bruise/bleed easily.  Psychiatric/Behavioral: Negative.      Objective:  BP (!) 152/84 (BP  Location: Left Arm, Patient Position: Sitting)   Pulse 74   Temp 98.6 F (37 C) (Temporal)   Resp 16   Ht 6\' 1"  (1.854 m)   SpO2 97%   BMI 28.63 kg/m   BP Readings from Last 3 Encounters:  04/25/24 (!) 157/80  04/24/24 (!) 152/84  04/23/24 (!) 172/92    Wt Readings from Last 3 Encounters:  03/25/24 217 lb (98.4 kg)  01/18/24 217 lb (98.4 kg)  01/11/24 217 lb (98.4 kg)    Physical Exam Vitals reviewed.  Constitutional:      General: He is not in acute distress.    Appearance: He is ill-appearing. He is not toxic-appearing or diaphoretic.  HENT:     Nose: Nose normal.  Eyes:     General: No scleral icterus.    Extraocular Movements: Extraocular movements intact.     Conjunctiva/sclera: Conjunctivae normal.     Comments: Pupils are symmetrically constricted  Cardiovascular:     Rate and Rhythm: Normal rate and regular rhythm.     Pulses: Normal pulses.     Heart sounds: No murmur heard.    No friction rub. No gallop.  Pulmonary:     Effort: Pulmonary effort is normal.     Breath sounds: No stridor. No wheezing, rhonchi or rales.  Abdominal:     General: Abdomen is flat.     Palpations: There is no mass.     Tenderness: There is no abdominal tenderness. There is no guarding.     Hernia: No hernia is present.  Musculoskeletal:        General: Normal range of motion.     Cervical back: Neck supple.  Lymphadenopathy:     Cervical: No cervical adenopathy.  Skin:    General: Skin is warm and dry.  Neurological:     Mental Status: He is alert. Mental status is at baseline.  Psychiatric:        Mood and Affect: Mood normal.        Behavior: Behavior normal.     Lab Results  Component Value Date   WBC 6.0 03/25/2024   HGB 14.9 03/25/2024   HCT 47.1 03/25/2024   PLT 207 03/25/2024   GLUCOSE 104 (H) 04/24/2024   CHOL 173 07/20/2023   TRIG 126 09/25/2023   HDL 45.00 07/20/2023   LDLDIRECT 116 (H) 01/21/2011   LDLCALC 90 07/20/2023   ALT 25 09/30/2023   AST  20 09/30/2023   NA 137 04/24/2024   K 4.4 04/24/2024   CL 106 04/24/2024   CREATININE 2.19 (H) 04/24/2024   BUN 27 (H) 04/24/2024   CO2 25 04/24/2024   TSH 0.48 04/24/2024   PSA 2.30 09/18/2023   INR 1.1 09/18/2023   HGBA1C 6.4 04/24/2024   MICROALBUR <0.7 04/24/2024    MR BRAIN WO CONTRAST Result Date: 03/25/2024 CLINICAL DATA:  Neuro deficit, concern for stroke. EXAM: MRI HEAD WITHOUT CONTRAST TECHNIQUE: Multiplanar, multiecho pulse sequences of the brain and surrounding structures  were obtained without intravenous contrast. COMPARISON:  MRI and MRA head 10/08/2023. FINDINGS: Brain: No acute infarct. Encephalomalacia and gliosis in the left parietal lobe compatible with remote infarct at the site of prior edema. Numerous additional areas of encephalomalacia and gliosis throughout the left centrum semiovale compatible with multiple remote infarcts. Additional encephalomalacia in the left inferior frontal gyrus with associated susceptibility suggestive of prior infarct with areas of remote hemorrhage. Scattered white matter signal abnormality in the periventricular and subcortical white matter. White matter extending from the posterior limb of the internal capsule into the left pons likely reflecting wallerian degeneration. No mass lesion or midline shift. Cerebellum is unremarkable. Normal appearance of midline structures. The basilar cisterns are patent. No extra-axial fluid collections. Ventricles: Prominence of the lateral ventricles suggestive of underlying parenchymal volume loss. Vascular: Skull base flow voids are visualized. Skull and upper cervical spine: No focal abnormality. Sinuses/Orbits: Orbits are symmetric. Paranasal sinuses are clear. Other: Trace fluid in the right mastoid air cells. IMPRESSION: No acute intracranial abnormality. Multiple remote infarcts in the left frontoparietal lobes and left centrum semiovale corresponding to the regions of edema on the prior study. Associated  susceptibility suggestive of remote hemorrhage. Mild chronic microvascular ischemic changes and parenchymal volume loss. Electronically Signed   By: Denny Flack M.D.   On: 03/25/2024 21:24    Assessment & Plan:   Controlled type 2 diabetes mellitus with complication, without long-term current use of insulin  (HCC)- His blood sugar is well controlled. -     Microalbumin / creatinine urine ratio; Future -     Basic metabolic panel with GFR; Future -     Hemoglobin A1c; Future -     Ambulatory referral to Ophthalmology  ESRD (end stage renal disease) (HCC)- Renal function has improved. -     Microalbumin / creatinine urine ratio; Future -     Basic metabolic panel with GFR; Future  Hypertension secondary to other renal disorders -     Microalbumin / creatinine urine ratio; Future -     Basic metabolic panel with GFR; Future -     TSH; Future -     Urinalysis, Routine w reflex microscopic; Future -     Terazosin  HCl; Take 2 capsules (4 mg total) by mouth at bedtime.  Dispense: 180 capsule; Refill: 0  Vision, loss, sudden, bilateral -     Ambulatory referral to Ophthalmology -     Acetylcholine receptor, binding; Future -     Striated muscle antibody; Future     Follow-up: Return in about 3 months (around 07/24/2024).  Sandra Crouch, MD

## 2024-04-25 ENCOUNTER — Ambulatory Visit: Attending: Internal Medicine | Admitting: Speech Pathology

## 2024-04-25 ENCOUNTER — Ambulatory Visit: Admitting: Physical Therapy

## 2024-04-25 ENCOUNTER — Encounter: Payer: Self-pay | Admitting: Internal Medicine

## 2024-04-25 ENCOUNTER — Ambulatory Visit: Admitting: Occupational Therapy

## 2024-04-25 VITALS — BP 157/80 | HR 73

## 2024-04-25 DIAGNOSIS — I639 Cerebral infarction, unspecified: Secondary | ICD-10-CM

## 2024-04-25 DIAGNOSIS — M6281 Muscle weakness (generalized): Secondary | ICD-10-CM | POA: Diagnosis not present

## 2024-04-25 DIAGNOSIS — R2681 Unsteadiness on feet: Secondary | ICD-10-CM | POA: Insufficient documentation

## 2024-04-25 DIAGNOSIS — R4701 Aphasia: Secondary | ICD-10-CM | POA: Insufficient documentation

## 2024-04-25 DIAGNOSIS — R2689 Other abnormalities of gait and mobility: Secondary | ICD-10-CM | POA: Diagnosis not present

## 2024-04-25 DIAGNOSIS — R278 Other lack of coordination: Secondary | ICD-10-CM

## 2024-04-25 DIAGNOSIS — R29818 Other symptoms and signs involving the nervous system: Secondary | ICD-10-CM | POA: Diagnosis not present

## 2024-04-25 LAB — URINALYSIS, ROUTINE W REFLEX MICROSCOPIC
Bilirubin Urine: NEGATIVE
Hgb urine dipstick: NEGATIVE
Ketones, ur: NEGATIVE
Leukocytes,Ua: NEGATIVE
Nitrite: NEGATIVE
Specific Gravity, Urine: 1.015 (ref 1.000–1.030)
Total Protein, Urine: NEGATIVE
Urine Glucose: NEGATIVE
Urobilinogen, UA: 2 — AB (ref 0.0–1.0)
pH: 6 (ref 5.0–8.0)

## 2024-04-25 LAB — BASIC METABOLIC PANEL WITH GFR
BUN: 27 mg/dL — ABNORMAL HIGH (ref 6–23)
CO2: 25 meq/L (ref 19–32)
Calcium: 9 mg/dL (ref 8.4–10.5)
Chloride: 106 meq/L (ref 96–112)
Creatinine, Ser: 2.19 mg/dL — ABNORMAL HIGH (ref 0.40–1.50)
GFR: 34.54 mL/min — ABNORMAL LOW (ref >60.00)
Glucose, Bld: 104 mg/dL — ABNORMAL HIGH (ref 70–99)
Potassium: 4.4 meq/L (ref 3.5–5.1)
Sodium: 137 meq/L (ref 135–145)

## 2024-04-25 LAB — HEMOGLOBIN A1C: Hgb A1c MFr Bld: 6.4 % (ref 4.6–6.5)

## 2024-04-25 LAB — MICROALBUMIN / CREATININE URINE RATIO
Creatinine,U: 137.9 mg/dL
Microalb Creat Ratio: UNDETERMINED mg/g (ref 0.0–30.0)
Microalb, Ur: <0.7 mg/dL

## 2024-04-25 LAB — TSH: TSH: 0.48 u[IU]/mL (ref 0.35–5.50)

## 2024-04-25 NOTE — Therapy (Signed)
 OUTPATIENT OCCUPATIONAL THERAPY NEURO TREATMENT AND DISCHARGE  Patient Name: [name] MRN: [id number] DOB:[birth year], 48 y.o., male Today's Date: 04/25/2024  OCCUPATIONAL THERAPY DISCHARGE SUMMARY  Visits from Start of Care: 11  Current functional level related to goals / functional outcomes: Patient has met 3/4 short-term goals and 2/5 long-term goals to date.   Remaining deficits: Pt is experiencing continued decline with vision in left eye. Due to this, his limitations fluctuate though his remaining deficits are only those impacted by vision.    Education / Equipment: Continue with HEP following d/c. Get new referral if medically appropriate following further vision assessment.    Patient agrees to discharge. Patient goals were partially met. Patient is being discharged due to a change in medical status.[name]   PCP: Andrew Crouch, MD REFERRING PROVIDER: Arcadio Knuckles, MD  END OF SESSION:  OT End of Session - 04/25/24 1451     Visit Number 11    Number of Visits 17    Date for OT Re-Evaluation 04/26/24    Authorization Type [employer]HC - Medicare    OT Start Time 1451    OT Stop Time 1505    OT Time Calculation (min) 14 min    Activity Tolerance Patient tolerated treatment well    Behavior During Therapy WFL for tasks assessed/performed            Past Medical History:  Diagnosis Date   AMPUTATION, BELOW KNEE, 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   Diabetes mellitus without complication [facility])    no since pancreas transplant   Dialysis patient [facility]) 04/18/2012   "[facility]; [place], [place]"   Gastroparesis    Gastropathy    GERD (gastroesophageal reflux disease)    Hypertension    MRSA infection    over 10 years ago per patient. in legs   Past Surgical History:  Procedure Laterality  Date   AV FISTULA PLACEMENT  08/2011   left upper arm   BELOW KNEE LEG AMPUTATION  "it's been awhile"   bilaterally   CATARACT EXTRACTION  ~ 2011   right   COMBINED KIDNEY-PANCREAS TRANSPLANT  2014   ESOPHAGOGASTRODUODENOSCOPY N/A 12/30/2016   Procedure: ESOPHAGOGASTRODUODENOSCOPY (EGD);  Surgeon: Andrew Jourdain, MD;  Location: [facility] ENDOSCOPY;  Service: Endoscopy;  Laterality: N/A;   OLECRANON BURSECTOMY Right 06/23/2020   Procedure: RIGHT ELBOW EXCISION OLECRANON BURSITIS;  Surgeon: Andrew Ford, MD;  Location: [facility];  Service: Orthopedics;  Laterality: Right;   Patient Active Problem List   Diagnosis Date Noted   Vision, loss, sudden, bilateral 04/24/2024   Ischemic cerebrovascular accident (CVA) due to global hypoperfusion with watershed infarction [facility]) 09/20/2023   Hypertension secondary to other renal disorders 09/18/2023   PAD (peripheral artery disease) (HCC) 09/04/2023   Diabetes mellitus type II, controlled (HCC) 09/04/2023   Abnormal electrocardiogram (ECG) (EKG) 07/20/2023   Chronic pain 07/04/2023   Tobacco dependence 07/04/2023   Marijuana dependence (HCC) 07/04/2023   Screen for colon cancer 01/10/2022   Balanitis 01/10/2022   Encounter for general adult medical examination with abnormal findings 01/10/2022   Screening for prostate cancer 01/10/2022   Dysphagia 02/17/2017   Impaired mobility and activities of daily living 07/03/2014   Renal transplant, status post 07/19/2013  History of pancreas transplant (HCC) 07/19/2013   Immunosuppressed status (HCC) 06/27/2013   H/O pancreas transplant (HCC) 06/27/2013   Diabetic gastroparesis associated with type 1 diabetes mellitus (HCC) 05/08/2013   S/P bilateral BKA (below knee amputation) (HCC) 08/17/2012   Metabolic bone disease 07/20/2011   ESRD (end stage renal disease) (HCC) 11/17/2009   GERD 03/14/2007   Dyslipidemia, goal LDL below 100 02/22/2007   Major depressive disorder, recurrent episode (HCC)  02/22/2007   POST TRAUMATIC STRESS DISORDER 02/22/2007   IMPOTENCE, 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   ONSET DATE: 02/10/2024 (Date of referral)  REFERRING DIAG: Z89.512,Z89.511 (ICD-10-CM) - S/P bilateral BKA (below knee amputation) (HCC) Z74.09,Z78.9 (ICD-10-CM) - Impaired mobility and activities of daily living I63.9 (ICD-10-CM) - Ischemic cerebrovascular accident (CVA) due to global hypoperfusion with watershed infarction  THERAPY DIAG:  Other lack of coordination  Muscle weakness (generalized)  Other symptoms and signs involving the nervous system  Acute CVA (cerebrovascular accident) (HCC)  Rationale for Evaluation and Treatment: Rehabilitation  SUBJECTIVE:   SUBJECTIVE STATEMENT: From eval: He is a [place] [employer] fan.   His PCP put in an urgent referral to a retinal specialist.   Pt accompanied by: self  PERTINENT HISTORY:  PAD, HTN, Strokes, s/p Kidney and Pancreas Transplant, Carotid Stenosis, T1DM, ESRD, bilateral BKA amputee   PRECAUTIONS: None  WEIGHT BEARING RESTRICTIONS: No  PAIN:  Are you having pain? 5/10 RLE > LLE  FALLS: Has patient fallen in last 6 months? Yes. Number of falls 1  LIVING ENVIRONMENT: Lives with: lives with their spouse Lives in: House/apartment Stairs: Yes: Internal: 15 steps; on right going up and External: 5 steps; bilateral but cannot reach both Has following equipment at home: Quad cane small base, Walker - 2 wheeled, Wheelchair (manual), and shower chair  PLOF: Independent; was driving;   PATIENT GOALS: get back to PLOF  OBJECTIVE:  Note: Objective measures were completed at Evaluation unless otherwise noted.  HAND DOMINANCE: Right  ADLs: Overall ADLs: mod I   IADLs: Light housekeeping: sweeping, gathers up laundry, dishes  Meal Prep: mod I Community mobility: dependent Medication management: setup Handwriting: 50% legible, Increased time, and due to cognitive impairments  MOBILITY STATUS: Independent - manual  wheelchair  ACTIVITY TOLERANCE: Activity tolerance: fair  FUNCTIONAL OUTCOME MEASURES:  04/01/24 - 8.3   Total score = sum of the activity scores/number of activities Minimum detectable change (90%CI) for average score = 2 points Minimum detectable change (90%CI) for single activity score = 3 points  UPPER EXTREMITY ROM:  Impaired R supination ~ neutral though able to improve to [facility] with increased time and awareness.     BUE - WNL though weaker on R vs L  HAND FUNCTION: Grip strength: Right: 81.5 lbs; Left: 97.8 lbs  COORDINATION: 9 Hole Peg test: Right: 47 sec; Left: 33 sec - mild tremor in R hand  SENSATION: WFL  EDEMA: none reported or observed  MUSCLE TONE:WFL  COGNITION: Overall cognitive status: Impaired - see ST for further details  VISION: Subjective report: Pt reports vision fluctuates including episodes of double vision and L visual field cut Baseline vision: Bifocals, Wears glasses all the time, and ST bifocals Visual history: cataracts and corrective eye surgery  VISION ASSESSMENT: WFL distance and near acuity; ~45* L visual field  PERCEPTION: Impaired: secondary to visual impairments; pt required cues to locate hand sanitizer on table, reaching to Kleenex originally  PRAXIS: Impaired: Motor planning and potential ideational praxis  OBSERVATIONS: Pt appears well-kept. Glasses hanging on shirt. Pt able  to self-propel manual wheelchair.                                                                                                                            TREATMENT :  Objective measures assessed as noted in Goals section to determine progression towards goals. OT reviewed use of long handled sponge with RUE to wash back. Pt demonstrated. OT also reviewed towel or dowel stretches for IR of R shoulder as needed to improve performance with washing back and to limit need of assistance.   OT educated that given pt's continued fluctuations with vision,  would recommend d/c at this time. Will consider new OT referral once medically appropriate.   PATIENT EDUCATION: Education details: see today's tx above Person educated: Patient and Spouse Education method: Explanation, Demonstration, and Verbal cues Education comprehension: verbalized understanding, returned demonstration, and verbal cues required  HOME EXERCISE PROGRAM: 03/04/24 - tremor compensation strategies 03/11/2024: vision strategies and visual scanning 03/14/24 - vision HEP (see pt instructions) 04/04/24 - red theraband RUE - Access Code: [email or web address]  GOALS:  SHORT TERM GOALS: Target date: 03/27/2024  Patient will demonstrate initial RUE HEP with 25% verbal cues or less for proper execution. 04/01/24 - Pt benefited from v/c.  Goal status: MET  2.  Patient will independently recall at least 2 compensatory strategies for visual impairment without cueing.  04/01/24 - With min v/c, pt recalled turning head to look and looking for the edges of objects.  04/04/24 - Pt ind recalled using systematic scanning method and turning head to look. Goal status: MET  3.  Patient will demonstrate at least 85% accuracy with environmental visual scanning to assist with ADLs and IADLS including potential driving considerations.  04/01/24 - Pt completed visual scanning task in clinic with 80% accuracy while self-propelling w/c. 04/04/24 - Pt identified 100% of items in Search and Find visual scanning activity book with extra time, including items on L side. 04/11/2024: 97.5% accuracy with letter cancellation; 80% accuracy with hallway scanning Goal status: IN Progress  4.  Pt will independently recall at least 2 tremor reduction strategies.   Baseline:  03/04/24 - OT educated pt on tremor compensation strategies. Pt returned demo. 04/01/24 - Pt ind recalled using weighted pen and stabilize arms on table. Goal status: MET  LONG TERM GOALS: Target date: 04/26/2024  Patient will demonstrate updated RUE HEP with  visual handouts only for proper execution.  Goal status: IN Progress  2.  03/04/24 revised - Patient will report at least two-point increase in average PSFS score or at least three-point increase in a single activity score indicating functionally significant improvement given minimum detectable change. Baseline: PSFS - 3.3 total score (See above for individual activity scores)  04/01/24 - 8.3 Goal status: MET  3.  Patient will demo improved FM coordination as evidenced by completing nine-hole peg with use of R in 35 seconds or less. Baseline: Right: 47 secs 04/01/24 - Trial 1 - Right: 57  seconds, Trial 2 - Right: 38 seconds 04/11/2024: 45 seconds; after positioning to right side of body because of decline in L visual acuity - 35 seconds 04/25/2024: 40, 39, 50 seconds Goal status: IN Progress  4.  Patient will demonstrate at least 88 lbs R grip strength as needed to open jars and other containers. Baseline: Right: 81.5 lbs 04/01/24 - Right: 85.5, 81.1, 73.6 (80.06 lbs average) 04/11/2024: 97.6 lbs Goal status: MET  5.  Pt to demonstrate improved R shoulder IR/use of AD to wash back mod I. (Added 04/11/2024) Baseline: dependent Goal status: INITIAL  ASSESSMENT:  CLINICAL IMPRESSION: Recommend d/c from OT at this time given pt's continual decline in vision. Would recommend additional skilled OT once medically stable.  PERFORMANCE DEFICITS: in functional skills including ADLs, IADLs, coordination, proprioception, sensation, strength, Fine motor control, decreased knowledge of precautions, decreased knowledge of use of DME, vision, and UE functional use, cognitive skills including attention, memory, and problem solving, and psychosocial skills including coping strategies.   IMPAIRMENTS: are limiting patient from ADLs, IADLs, rest and sleep, leisure, and social participation.   CO-MORBIDITIES: may have co-morbidities  that affects occupational performance. Patient will benefit from skilled OT to  address above impairments and improve overall function.  REHAB POTENTIAL: Fair given comorbidities  PLAN:  OT D/C Completed  Altamease Asters, OT 04/25/2024, 3:28 PM

## 2024-04-25 NOTE — Therapy (Signed)
 OUTPATIENT PHYSICAL THERAPY NEURO TREATMENT- DISCHARGE SUMMARY    Patient Name: [name] MRN: [id number] DOB:01-23-1976, 48 y.o., male Today's Date: 04/25/2024   PCP: Andrew Knuckles, MD REFERRING PROVIDER: Arcadio Knuckles, MD  PHYSICAL THERAPY DISCHARGE SUMMARY  Visits from Start of Care: 10  Current functional level related to goals / functional outcomes: Pt most independent in manual WC, uses RW for short distances w/SBA-min A w/bilateral BKA prostheses    Remaining deficits: Impaired vision, high fall risk, decreased safety awareness, R hemiplegia    Education / Equipment: HEP   Patient agrees to discharge. Patient goals were not met. Patient is being discharged due to a change in medical status.   END OF SESSION:  PT End of Session - 04/25/24 1512     Visit Number 10    Number of Visits 17    Date for PT Re-Evaluation 05/01/24    Authorization Type [employer] Dual complete    PT Start Time 1511    PT Stop Time 1519   DC   PT Time Calculation (min) 8 min    Equipment Utilized During Treatment --    Activity Tolerance Patient tolerated treatment well    Behavior During Therapy Flat affect                Past Medical History:  Diagnosis Date   AMPUTATION, BELOW KNEE, 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   Diabetes mellitus without complication [facility])    no since pancreas transplant   Dialysis patient [facility]) 04/18/2012   "[facility]; [place], [place], [place]"   Gastroparesis    Gastropathy    GERD (gastroesophageal reflux disease)    Hypertension    MRSA infection    over 10 years ago per patient. in legs   Past Surgical History:  Procedure Laterality Date   AV FISTULA PLACEMENT  08/2011   left upper arm   BELOW KNEE LEG AMPUTATION  "it's been awhile"   bilaterally   CATARACT  EXTRACTION  ~ 2011   right   COMBINED KIDNEY-PANCREAS TRANSPLANT  2014   ESOPHAGOGASTRODUODENOSCOPY N/A 12/30/2016   Procedure: ESOPHAGOGASTRODUODENOSCOPY (EGD);  Surgeon: Alvis Jourdain, MD;  Location: [facility] ENDOSCOPY;  Service: Endoscopy;  Laterality: N/A;   OLECRANON BURSECTOMY Right 06/23/2020   Procedure: RIGHT ELBOW EXCISION OLECRANON BURSITIS;  Surgeon: Timothy Ford, MD;  Location: [facility];  Service: Orthopedics;  Laterality: Right;   Patient Active Problem List   Diagnosis Date Noted   Vision, loss, sudden, bilateral 04/24/2024   Ischemic cerebrovascular accident (CVA) due to global hypoperfusion with watershed infarction [facility]) 09/20/2023   Hypertension secondary to other renal disorders 09/18/2023   PAD (peripheral artery disease) (HCC) 09/04/2023   Diabetes mellitus type II, controlled (HCC) 09/04/2023   Abnormal electrocardiogram (ECG) (EKG) 07/20/2023   Chronic pain 07/04/2023   Tobacco dependence 07/04/2023   Marijuana dependence (HCC) 07/04/2023   Screen for colon cancer 01/10/2022   Balanitis 01/10/2022   Encounter for general adult medical examination with abnormal findings 01/10/2022   Screening for prostate cancer 01/10/2022   Dysphagia 02/17/2017   Impaired mobility and activities of daily living 07/03/2014   Renal transplant, status post 07/19/2013   History of pancreas transplant (HCC) 07/19/2013  Immunosuppressed status (HCC) 06/27/2013   H/O pancreas transplant (HCC) 06/27/2013   Diabetic gastroparesis associated with type 1 diabetes mellitus (HCC) 05/08/2013   S/P bilateral BKA (below knee amputation) (HCC) 08/17/2012   Metabolic bone disease 07/20/2011   ESRD (end stage renal disease) (HCC) 11/17/2009   GERD 03/14/2007   Dyslipidemia, goal LDL below 100 02/22/2007   Major depressive disorder, recurrent episode (HCC) 02/22/2007   POST TRAUMATIC STRESS DISORDER 02/22/2007   IMPOTENCE, 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    ONSET DATE: 02/10/2024 (referral)    REFERRING DIAG: O96.295,M84.132 (ICD-10-CM) - S/P bilateral BKA (below knee amputation) (HCC) Z74.09,Z78.9 (ICD-10-CM) - Impaired mobility and activities of daily living I63.9 (ICD-10-CM) - Ischemic cerebrovascular accident (CVA) due to global hypoperfusion with watershed infarction [facility])  THERAPY DIAG:  Other lack of coordination  Muscle weakness (generalized)  Unsteadiness on feet  Other abnormalities of gait and mobility  Rationale for Evaluation and Treatment: Rehabilitation  SUBJECTIVE:                                                                                                                                                                                             SUBJECTIVE STATEMENT: Pt received from OT in [facility], flat affect today. Pt and wife report they are in agreement to DC from all therapies today to focus on pt's vision. Pt's PCP sent an urgent referral request to retina specialist, so they are waiting to hear back. Pt has had one recent fall, fell in driveway w/RW. Does not know what happened and was unable to get up on his own. Pain is about the same today   Pt accompanied by:  Wife, [name]  PERTINENT HISTORY: PAD, HTN, Strokes, s/p Kidney and Pancreas Transplant, Carotid Stenosis, T1DM, ESRD, bilateral BKA amputee   PAIN: "I am always in pain" Are you having pain? Yes: NPRS scale: 6/10 Pain location: B LE Pain description: soreness   PRECAUTIONS: Fall  PATIENT GOALS: " To get my walk as close as I can to where I was"   OBJECTIVE:  Note: Objective measures were completed at Evaluation unless otherwise noted.  DIAGNOSTIC FINDINGS: CT of head from 10/11/23  IMPRESSION: Evolving left MCA territory infarcts involving the left frontal and parietal lobes, better seen on recent prior brain MRI. Redemonstrated petechial hemorrhage along the posterior left frontal lobe.  MRI of head from 09/18/23 IMPRESSION: 1. Scattered small foci of acute subcortical  infarction in the left occipital lobe and posterior left frontal lobe. 2. Faint diffusion signal abnormality along the left parietal lobe may reflect evolving infarct versus seizure related cytotoxic edema. 3. Occluded left ICA with reconstitution of the  communicating segment.  VITALS  Vitals:   04/25/24 1516  BP: (!) 157/80  Pulse: 73                                                                                                                             TREATMENT:   Self-care/home management  Assessed vitals (see above) and BP in better range than most recent readings, but still elevated.  Provided handout of HEP and verbally reviewed each exercise. Pt and wife verbalized understanding  Educated pt and wife on how to return to clinic in future when he is ready to return.      PATIENT EDUCATION: Education details: See self-care section  Person educated: Patient and Spouse Education method: Explanation, Demonstration, and Handouts Education comprehension: verbalized understanding  HOME EXERCISE PROGRAM: Access Code: [email or web address] URL: [email or web address] Date: 03/04/2024 Prepared by: Nickola Baron  Exercises - Standing Hip Abduction with Counter Support  - 1 x daily - 7 x weekly - 3 sets - 10 reps - Standing March with Counter Support  - 1 x daily - 7 x weekly - 3 sets - 10 reps - Standing Hip Extension with Counter Support  - 1 x daily - 7 x weekly - 3 sets - 10 reps - Mini Squat with Counter Support  - 1 x daily - 7 x weekly - 3 sets - 10 reps - Sit to Stand with Counter Support  - 1 x daily - 7 x weekly - 3 sets - 5 reps -seated march over  - Lower Trunk Rotation Stretch  - 1 x daily - 7 x weekly - 3 sets - 10 reps - Single Leg Bridge  - 1 x daily - 7 x weekly - 1-2 sets - 6-10 reps  GOALS: Goals reviewed with patient? Yes  SHORT TERM GOALS: Target date: 03/27/2024   Pt will perform floor transfer w/min A for improved fall recovery and safety at  home  Baseline: Goal status: NOT MET - pt declined trying on 4/17   2.  Pt will improve 5 x STS to less than or equal to 21 seconds w/SBA to demonstrate improved functional strength and transfer efficiency.   Baseline: 26.5s; 15.5s w/BUE support Goal status: MET  3.  Pt will improve gait velocity to at least 1.2 ft/s w/LRAD and CGA for improved gait efficiency and independence  Baseline: 0.8 ft/s w/SBQC and CGA; 1.12 ft/s w/SBQC and CGA Goal status: PARTIALLY MET   4.  Pt will ascend/descend 12 steps w/single rail and CGA for improved safety in home environment  Baseline: Min A w/bilateral rails  Goal status: IN PROGRESS   LONG TERM GOALS: Target date: 04/24/2024   Pt will perform floor transfer w/CGA for fall recovery and safety  Baseline:  Goal status: NOT MET  2.  Pt will improve 5 x STS to less than or equal to 12 seconds mod I to demonstrate improved functional strength and transfer efficiency.  Baseline: 26.5s;  15.5s (4/14) Goal status: NOT MET  3.  Pt will improve gait velocity to at least 1.5 ft/s w/LRAD and SBA for improved gait efficiency and reduced fall risk   Baseline: 0.8 ft/s w/SBQC and CGA Goal status: NOT MET  4.  Pt will navigate 12 stairs w/single rail and SBA for improved safety in home environment.  Baseline: min A w/bilateral rails  Goal status: NOT MET   ASSESSMENT:  CLINICAL IMPRESSION: Pt being DC this date due to ongoing changes in vision and pt wanting to focus on his L eye prior to continuing SLP, OT and PT. Did not assess goals today as these were assessed at pt's last visit and some goals are not safe for pt to attempt at this time. Pt has had one fall recently in his driveway, was using RW. At this time, pt is safest w/use of WC so discouraged use of SBQC due to instability and high fall risk. Pt has been referred to a retina specialist to address visual cuts in L eye and will plan on returning to therapy once his vision has been addressed. Pt  DC today per pt and wife's request.    OBJECTIVE IMPAIRMENTS: Abnormal gait, decreased activity tolerance, decreased balance, decreased cognition, decreased endurance, decreased knowledge of condition, decreased knowledge of use of DME, difficulty walking, decreased strength, decreased safety awareness, impaired perceived functional ability, impaired sensation, improper body mechanics, prosthetic dependency , and pain  ACTIVITY LIMITATIONS: carrying, lifting, bending, standing, squatting, stairs, transfers, bathing, dressing, hygiene/grooming, locomotion level, and caring for others  PARTICIPATION LIMITATIONS: meal prep, cleaning, laundry, medication management, interpersonal relationship, driving, shopping, community activity, and yard work  PERSONAL FACTORS: Fitness, Past/current experiences, and 1-2 comorbidities: bilateral BKA, CVA  are also affecting patient's functional outcome.   REHAB POTENTIAL: Good  CLINICAL DECISION MAKING: Evolving/moderate complexity  EVALUATION COMPLEXITY: Moderate  PLAN:  PT FREQUENCY: 2x/week  PT DURATION: 8 weeks  PLANNED INTERVENTIONS: 97164- PT Re-evaluation, 97110-Therapeutic exercises, 97530- Therapeutic activity, 97112- Neuromuscular re-education, 97535- Self Care, 82956- Manual therapy, [phone]- Gait training, [phone]- Prosthetic training, [phone]- Electrical stimulation (manual), Patient/Family education, Balance training, Stair training, Dry Needling, and DME instructions    Curtistine Downer Caliana Spires, PT, DPT 04/25/2024, 3:28 PM

## 2024-04-25 NOTE — Therapy (Signed)
 OUTPATIENT SPEECH LANGUAGE PATHOLOGY APHASIA TREATMENT   Patient Name: [name] MRN: [id number] DOB:[birth year], 48 y.o., male Today's Date: 04/25/2024  PCP: Arcadio Knuckles, MD REFERRING PROVIDER: Arcadio Knuckles, MD  END OF SESSION:  End of Session - 04/25/24 1407     Visit Number 4    Number of Visits 25    Date for SLP Re-Evaluation 05/22/24    SLP Start Time 1403    SLP Stop Time  1445    SLP Time Calculation (min) 42 min    Activity Tolerance Patient tolerated treatment well              Past Medical History:  Diagnosis Date   AMPUTATION, BELOW KNEE, 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   Diabetes mellitus without complication [facility])    no since pancreas transplant   Dialysis patient [facility]) 04/18/2012   "[facility]; [place], [place], [place]"   Gastroparesis    Gastropathy    GERD (gastroesophageal reflux disease)    Hypertension    MRSA infection    over 10 years ago per patient. in legs   Past Surgical History:  Procedure Laterality Date   AV FISTULA PLACEMENT  08/2011   left upper arm   BELOW KNEE LEG AMPUTATION  "it's been awhile"   bilaterally   CATARACT EXTRACTION  ~ 2011   right   COMBINED KIDNEY-PANCREAS TRANSPLANT  2014   ESOPHAGOGASTRODUODENOSCOPY N/A 12/30/2016   Procedure: ESOPHAGOGASTRODUODENOSCOPY (EGD);  Surgeon: Alvis Jourdain, MD;  Location: [facility] ENDOSCOPY;  Service: Endoscopy;  Laterality: N/A;   OLECRANON BURSECTOMY Right 06/23/2020   Procedure: RIGHT ELBOW EXCISION OLECRANON BURSITIS;  Surgeon: Timothy Ford, MD;  Location: [facility];  Service: Orthopedics;  Laterality: Right;   Patient Active Problem List   Diagnosis Date Noted   Vision, loss, sudden, bilateral 04/24/2024   Ischemic cerebrovascular accident (CVA) due to global hypoperfusion with  watershed infarction [facility]) 09/20/2023   Hypertension secondary to other renal disorders 09/18/2023   PAD (peripheral artery disease) (HCC) 09/04/2023   Diabetes mellitus type II, controlled (HCC) 09/04/2023   Abnormal electrocardiogram (ECG) (EKG) 07/20/2023   Chronic pain 07/04/2023   Tobacco dependence 07/04/2023   Marijuana dependence (HCC) 07/04/2023   Screen for colon cancer 01/10/2022   Balanitis 01/10/2022   Encounter for general adult medical examination with abnormal findings 01/10/2022   Screening for prostate cancer 01/10/2022   Dysphagia 02/17/2017   Impaired mobility and activities of daily living 07/03/2014   Renal transplant, status post 07/19/2013   History of pancreas transplant (HCC) 07/19/2013   Immunosuppressed status (HCC) 06/27/2013   H/O pancreas transplant (HCC) 06/27/2013   Diabetic gastroparesis associated with type 1 diabetes mellitus (HCC) 05/08/2013   S/P bilateral BKA (below knee amputation) (HCC) 08/17/2012   Metabolic bone disease 07/20/2011   ESRD (end stage renal disease) (HCC) 11/17/2009   GERD 03/14/2007   Dyslipidemia, goal LDL below 100 02/22/2007   Major depressive disorder, recurrent episode (HCC) 02/22/2007   POST TRAUMATIC STRESS DISORDER 02/22/2007   IMPOTENCE, 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    ONSET DATE: 09/17/24   REFERRING DIAG: B14.782,N56.213 (ICD-10-CM) - S/P bilateral BKA (below knee amputation) (HCC) Z74.09,Z78.9 (ICD-10-CM) - Impaired mobility and activities of daily living I63.9 (ICD-10-CM) -  Ischemic cerebrovascular accident (CVA) due to global hypoperfusion with watershed infarction [facility])  THERAPY DIAG:  Aphasia  Rationale for Evaluation and Treatment: Rehabilitation  SUBJECTIVE:   SUBJECTIVE STATEMENT: Pt reports pleased with current functional status, agreeable to dc on this date.  Pt accompanied by: significant other [name]  PERTINENT HISTORY: Patient is a 48 yo male who presented to ED 9/23 with AMS. Pt found to be aphasic with a  facial droop and a code stroke was activated. Intubated 9/23-9/24. MRI Brain with scattered small foci of acute subcortical infarction in L occipital lobe and posterior L frontal lobe and occluded L ICA. Recently admitted 9/9-9/11 with findings of E coli UTI and poorly controlled HTN. 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 Therapy evaluations completed with recommendations for CIR. Patient admitted 09/29/23. D/C from CIR11/1/24 with [facility] ST/PT/OT.   PAIN:  Are you having pain? Pt denies pain   FALLS: Has patient fallen in last 6 months?  See PT evaluation for details  LIVING ENVIRONMENT: Lives with: lives with their spouse Lives in: House/apartment  PLOF:  Level of assistance: Independent with ADLs, Independent with IADLs Employment: On disability  PATIENT GOALS: "To make my speech better"  OBJECTIVE:  Note: Objective measures were completed at Evaluation unless otherwise noted.  DYSPHAGIA EVALUATION: SUBJECTIVE DYSPHAGIA REPORTS: Reported symptoms: globus sensation  Current diet: regular and thin liquids  Co-morbid voice changes: No  FACTORS WHICH MAY INCREASE RISK OF ADVERSE EVENT IN PRESENCE OF ASPIRATION:  General health: well appearing  Risk factors: none evident     ORAL MOTOR EXAMINATION: Overall status: WFL  CLINICAL SWALLOW ASSESSMENT:   Dentition: adequate natural dentition Vocal quality at baseline: normal Patient directly observed with POs: Yes:  3 oz water challenge, thin liquids   Feeding: able to feed self Liquids provided by: cup [facility] Swallow Protocol: Pass Oral phase signs and symptoms: none Pharyngeal phase signs and symptoms: none                                                                                                                    TREATMENT DATE:   04/25/24: Pt enodrses improvement via PROM, see below. Discussed compensations for ongoing challenges with  speaking quickly and giving details. Practiced self-advocacy scripts with pt demonstrating mod-I. Led pt through divergent naming task, with pt completing task well, needing rare questioning cues to meet set target of three items for complex/abstract categories.   The Communicative Participation Item Bank        Does your condition interfere with... Pt Rating   ...talking with people you know 3   ...communicating when you need to say something quickly 2   ...talking with people you do not know 3   ...communicating when you are out in your community 3   ...asking questions in a conversation 3   ....communicating in a small group of people 3   ...having a long conversation 3   ...giving detailed  infomrmation 2   ...getting your turn in a fast moving conversation 3   ...trying to persuade a friend or family member to see a different point of view 3  3= Not at all; 2=A little; 1=Quite a bit; 0=Very much TOTAL: 28   04/08/24: Targeted word finding and written expression at word level naming and writing car makes or models- with occasional semantic cues from spouse and ST, Saige named and wrote 13 cars - occasional min A to ID and self correct errors. Trained in verbal compensations for aphasia generating 3-4 sentence descriptions for 8 basic objects - Nameer required questioning cues 4x to generate most salient descriptions. Targeted verbal compensations for aphasia generating 3 similarities and 3 differences for 3 sets of related words with occasional  semantic and questioning cues.   04/04/24: swallow eval completed, see above.   Addressed anomia via direct patient instruction for anomia strategies. Led pt through structured language task generating salient features of target words to build lexical connections and enhance understanding re: description anomia strategy. Pt successfully described x2 targets with min-A questioning cues for expanding lexicon.   02/28/24: Eval only  PATIENT  EDUCATION: Education details: compensations for aphasia; compensations for cognition, See Treatment, See Patient Instructions Person educated: Patient and Spouse Education method: Explanation, Verbal cues, and Handouts Education comprehension: verbal cues required and needs further education   GOALS: Goals reviewed with patient? Yes  SHORT TERM GOALS: Target date: 03/27/24  Pt will name 15 items in personally relevant category Baseline: Goal status: NOT MET -- LIMITED SESSIONS  2.  Pt will employ verbal compensations for aphasia in structured naming task 3/5 opportunities with rare min A Baseline:  Goal status: MET  3.  Pt will generate moderately complex sentences in structured language task with occasional min A Baseline:  Goal status: MET  4.  Pt will write a phrase level with occasional min to mod A Baseline:  Goal status: NOT MET -- LIMITED SESSIONS  5.  Pt will use compensations for memory to manage am meds with rare min A from spouse Baseline:  Goal status: MET  6.  Pt will use verbal compensations for aphasia during simple conversation 2/4 opportunities with usual mod A Baseline:  Goal status: MET  LONG TERM GOALS: Target date: 05/22/24  Pt will use verbal compensations for aphasia during moderately complex conversation 4/6 opportunities with occasional min A Baseline:  04/25/2024 Goal status: MET  2.  Pt will write at sentence level, ID and self correct errors with occasional min A Baseline:  Goal status: NOT MET -- LIMITED SESSIONS  3.  Pt will send 3 simple texts with rare min A Baseline:  Goal status: NOT MET -- LIMITED SESSIONS  4.  Pt will use compensations for aphasia in detailed or persuasive narrative 4/6 opportunities with occasional min A Baseline:  Goal status: NOT MET -- LIMITED SESSIONS  5.  Pt will improve score on Communicative Participation Item Bank Baseline: 15 Goal status: MET   ASSESSMENT:  CLINICAL IMPRESSION: Patient is a 48 y.o.  male who was seen today for aphasia treatment session. Participates effectively in conversation this date, stating details from recent trip. Led pt through moderately complex naming task with rare min-A provided via questioning cues. Pt is pleased with current status, improvement supported by spouse and completion of PROM.   OBJECTIVE IMPAIRMENTS: include attention, memory, aphasia, and apraxia. These impairments are limiting patient from managing medications, managing appointments, and effectively communicating at home and in community.  Factors affecting potential to achieve goals and functional outcome are co-morbidities. Patient will benefit from skilled SLP services to address above impairments and improve overall function.  REHAB POTENTIAL: Good  PLAN:  SLP FREQUENCY: 2x/week  SLP DURATION: 12 weeks  PLANNED INTERVENTIONS: Language facilitation, Environmental controls, Cueing hierachy, Cognitive reorganization, Internal/external aids, Functional tasks, Multimodal communication approach, and SLP instruction and feedback  SPEECH THERAPY DISCHARGE SUMMARY  Visits from Start of Care: 4  Current functional level related to goals / functional outcomes: Pt participating effectively in conversational level speech tasks. He reports overall improved communication with occasional word finding. Demonstrating compensations during structured tasks with reported carryover at home.   Remaining deficits: Mild aphasia, cognitive communication deficits   Education / Equipment: See tx section   Patient agrees to discharge. Patient goals were partially met. Patient is being discharged due to being pleased with the current functional level.     Alston Jerry, CCC-SLP 04/25/2024, 2:08 PM

## 2024-04-28 ENCOUNTER — Other Ambulatory Visit: Payer: Self-pay | Admitting: Internal Medicine

## 2024-04-28 ENCOUNTER — Encounter: Payer: Self-pay | Admitting: Internal Medicine

## 2024-04-28 DIAGNOSIS — K219 Gastro-esophageal reflux disease without esophagitis: Secondary | ICD-10-CM

## 2024-04-29 ENCOUNTER — Other Ambulatory Visit: Payer: Self-pay

## 2024-04-29 DIAGNOSIS — I151 Hypertension secondary to other renal disorders: Secondary | ICD-10-CM

## 2024-04-29 MED ORDER — CARVEDILOL 12.5 MG PO TABS: 12.5000 mg | ORAL_TABLET | Freq: Two times a day (BID) | ORAL | 1 refills | Status: DC

## 2024-04-30 ENCOUNTER — Other Ambulatory Visit: Payer: Self-pay

## 2024-04-30 ENCOUNTER — Ambulatory Visit: Admitting: Internal Medicine

## 2024-05-01 ENCOUNTER — Other Ambulatory Visit: Payer: Self-pay

## 2024-05-03 ENCOUNTER — Institutional Professional Consult (permissible substitution) (INDEPENDENT_AMBULATORY_CARE_PROVIDER_SITE_OTHER): Admitting: Otolaryngology

## 2024-05-06 ENCOUNTER — Other Ambulatory Visit: Payer: Self-pay | Admitting: Internal Medicine

## 2024-05-06 ENCOUNTER — Ambulatory Visit (HOSPITAL_COMMUNITY)
Admission: RE | Admit: 2024-05-06 | Discharge: 2024-05-06 | Disposition: A | Discharge status: Home / Self Care | Source: Ambulatory Visit | Attending: Gastroenterology | Admitting: Gastroenterology

## 2024-05-06 DIAGNOSIS — I69391 Dysphagia following cerebral infarction: Secondary | ICD-10-CM | POA: Diagnosis not present

## 2024-05-06 DIAGNOSIS — R1310 Dysphagia, unspecified: Secondary | ICD-10-CM | POA: Diagnosis not present

## 2024-05-06 DIAGNOSIS — K219 Gastro-esophageal reflux disease without esophagitis: Secondary | ICD-10-CM

## 2024-05-06 DIAGNOSIS — Z4682 Encounter for fitting and adjustment of non-vascular catheter: Secondary | ICD-10-CM | POA: Diagnosis not present

## 2024-05-06 DIAGNOSIS — K224 Dyskinesia of esophagus: Secondary | ICD-10-CM | POA: Diagnosis not present

## 2024-05-07 ENCOUNTER — Other Ambulatory Visit: Payer: Self-pay

## 2024-05-15 DIAGNOSIS — E113593 Type 2 diabetes mellitus with proliferative diabetic retinopathy without macular edema, bilateral: Secondary | ICD-10-CM | POA: Diagnosis not present

## 2024-05-15 DIAGNOSIS — I639 Cerebral infarction, unspecified: Secondary | ICD-10-CM | POA: Diagnosis not present

## 2024-05-15 DIAGNOSIS — Z961 Presence of intraocular lens: Secondary | ICD-10-CM | POA: Diagnosis not present

## 2024-05-15 LAB — HM DIABETES EYE EXAM

## 2024-05-17 ENCOUNTER — Encounter (INDEPENDENT_AMBULATORY_CARE_PROVIDER_SITE_OTHER): Payer: Self-pay | Admitting: Ophthalmology

## 2024-05-17 ENCOUNTER — Ambulatory Visit (INDEPENDENT_AMBULATORY_CARE_PROVIDER_SITE_OTHER): Admitting: Ophthalmology

## 2024-05-17 ENCOUNTER — Other Ambulatory Visit: Payer: Self-pay

## 2024-05-17 VITALS — BP 143/79 | HR 64

## 2024-05-17 DIAGNOSIS — H35033 Hypertensive retinopathy, bilateral: Secondary | ICD-10-CM | POA: Diagnosis not present

## 2024-05-17 DIAGNOSIS — E113513 Type 2 diabetes mellitus with proliferative diabetic retinopathy with macular edema, bilateral: Secondary | ICD-10-CM | POA: Diagnosis not present

## 2024-05-17 DIAGNOSIS — I10 Essential (primary) hypertension: Secondary | ICD-10-CM

## 2024-05-17 DIAGNOSIS — G890 Central pain syndrome: Secondary | ICD-10-CM | POA: Diagnosis not present

## 2024-05-17 DIAGNOSIS — Z8673 Personal history of transient ischemic attack (TIA), and cerebral infarction without residual deficits: Secondary | ICD-10-CM

## 2024-05-17 DIAGNOSIS — Z79899 Other long term (current) drug therapy: Secondary | ICD-10-CM | POA: Diagnosis not present

## 2024-05-17 DIAGNOSIS — H469 Unspecified optic neuritis: Secondary | ICD-10-CM

## 2024-05-17 DIAGNOSIS — S88119A Complete traumatic amputation at level between knee and ankle, unspecified lower leg, initial encounter: Secondary | ICD-10-CM | POA: Diagnosis not present

## 2024-05-17 DIAGNOSIS — Z961 Presence of intraocular lens: Secondary | ICD-10-CM

## 2024-05-17 DIAGNOSIS — G8191 Hemiplegia, unspecified affecting right dominant side: Secondary | ICD-10-CM | POA: Diagnosis not present

## 2024-05-17 DIAGNOSIS — G894 Chronic pain syndrome: Secondary | ICD-10-CM | POA: Diagnosis not present

## 2024-05-17 DIAGNOSIS — M549 Dorsalgia, unspecified: Secondary | ICD-10-CM | POA: Diagnosis not present

## 2024-05-17 DIAGNOSIS — Z9483 Pancreas transplant status: Secondary | ICD-10-CM | POA: Diagnosis not present

## 2024-05-17 DIAGNOSIS — N1831 Chronic kidney disease, stage 3a: Secondary | ICD-10-CM | POA: Diagnosis not present

## 2024-05-17 DIAGNOSIS — G546 Phantom limb syndrome with pain: Secondary | ICD-10-CM | POA: Diagnosis not present

## 2024-05-17 MED ORDER — OXYCODONE HCL 15 MG PO TABS
15.0000 mg | ORAL_TABLET | Freq: Four times a day (QID) | ORAL | 0 refills | Status: DC
  Filled 2024-05-17 – 2024-05-21 (×2): qty 120, 30d supply, fill #0

## 2024-05-17 MED ORDER — NALOXONE HCL 4 MG/0.1ML NA LIQD
1.0000 | NASAL | 2 refills | Status: AC | PRN
  Filled 2024-05-17: qty 2, 30d supply, fill #0

## 2024-05-17 MED ORDER — BEVACIZUMAB CHEMO INJECTION 1.25MG/0.05ML SYRINGE FOR KALEIDOSCOPE
1.2500 mg | INTRAVITREAL | Status: AC | PRN
  Administered 2024-05-17: 1.25 mg via INTRAVITREAL

## 2024-05-17 NOTE — Progress Notes (Signed)
 [facility] - Clinic Note  05/17/2024   CHIEF COMPLAINT Patient presents for Retina Evaluation  HISTORY OF PRESENT ILLNESS: [name] is a 48 y.o. male who presents to the clinic today for:  HPI     Retina Evaluation   In both eyes.  This started 3 months ago.  Duration of 3 months.  I, the attending physician,  performed the HPI with the patient and updated documentation appropriately.        Comments   Patient here for Retina Evaluation. Referred by Dr Diedre Fox. Patient states vision not good OS. OS blurry. OD is like something in it. Had a stroke in September 2024. Had vision changes then. In last 3 months got worse. No eye pain.      Last edited by Ronelle Coffee, MD on 05/17/2024 11:57 AM.    Pt is here on the referral of Dr. 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 , pt states he is no longer considered diabetic bc of his transplant, his last A1c was 6.4 on 04.30.25, pt had a stroke in September 2024 thought to be related to immunosuppressant that replaced tacrolimus  (belatacept ), pt has had cataract sx OU, but doesn't remember the dr, pt states his left eye vision is "kind of foggy", he states his right eye looks like he has blood in it, pts wife states before the stroke the pt was completely independent, he drove and worked, after the stroke he is in a wheelchair and has felt like his vision has been worse, especially over the past 3 months   Referring physician: Sidra Dredge, MD [address] [place],  [place] [address]  HISTORICAL INFORMATION:  Selected notes from the MEDICAL RECORD NUMBER Referred by Dr. Diedre Fox for PDR OU LEE:  Ocular Hx- previously followed with Dr. Gaylyn Keas for hx of optic neuropathy (induced by tacrolimus ) -- last visit , Nov 2022 PMH- s/p kidney / pancreas transplant; history of  CVA (related to belatacept  infusion)   CURRENT MEDICATIONS: No current outpatient medications on file. (Ophthalmic Drugs)   No current facility-administered medications for this visit. (Ophthalmic Drugs)   Current Outpatient Medications (Other)  Medication Sig   acetaminophen  (TYLENOL ) 325 MG tablet Take 1-2 tablets (325-650 mg total) by mouth every 4 (four) hours as needed for mild pain (pain score 1-3).   aspirin  EC 81 MG tablet Take 1 tablet (81 mg total) by mouth daily. Swallow whole.   atorvastatin  (LIPITOR) 40 MG tablet Take 1 tablet (40 mg total) by mouth daily.   carvedilol  (COREG ) 12.5 MG tablet Take 1 tablet (12.5 mg total) by mouth 2 (two) times daily with a meal.   cycloSPORINE  modified (NEORAL ) 100 MG capsule Take 100 mg by mouth 2 (two) times daily. Taken with 50 mg tablet   cycloSPORINE  modified (NEORAL ) 50 MG capsule Take 1 capsule by mouth two times daily, Take along with 100 mg twice a day   hydrALAZINE  (APRESOLINE ) 50 MG tablet Take 1 tablet (50 mg total) by mouth 3 (three) times daily.   mycophenolate  (MYFORTIC ) 180 MG EC tablet Take 1 tablet (180 mg total) by mouth 2 (two) times daily.   naloxone  (NARCAN ) nasal spray 4 mg/0.1 mL Inhale 1 (one) Spray if poorly responding or lips turning blue   nystatin  (MYCOSTATIN ) 100000 UNIT/ML suspension Swish and  swallow 5 mls four times daily for 14 days   oxyCODONE  (ROXICODONE ) 15 MG immediate release tablet Take 1 tablet (15 mg total) by mouth in the morning, at noon, in the evening, and at bedtime if needed for pain   Oxycodone  HCl 10 MG TABS Take 0.5-1 tablets (5-10 mg total) by mouth 5 (five) times daily as needed,   pantoprazole  (PROTONIX ) 40 MG tablet TAKE 1 TABLET(40 MG) BY MOUTH TWICE DAILY   predniSONE  (DELTASONE ) 5 MG tablet Take 5 mg by mouth daily.   sulfamethoxazole -trimethoprim  (BACTRIM ) 400-80 MG tablet Take 1 tablet by mouth every Monday, Wednesday, and Friday.   terazosin  (HYTRIN ) 2 MG capsule Take 2 capsules (4 mg  total) by mouth at bedtime.   fluconazole  (DIFLUCAN ) 200 MG tablet Take 1 tablet (200 mg total) by mouth daily. (Patient not taking: Reported on 04/23/2024)   No current facility-administered medications for this visit. (Other)   REVIEW OF SYSTEMS: ROS   Positive for: Musculoskeletal, Eyes Last edited by Sylvan Evener, COA on 05/17/2024  8:35 AM.     ALLERGIES Allergies  Allergen Reactions   Belatacept  Anaphylaxis    hypotensive with altered reaction and required epinephrine .  Needed to be intubated for airway protection.   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   Diabetes mellitus without complication [facility])    no since pancreas transplant   Dialysis patient [facility]) 04/18/2012   "[facility]; [place], [place], [place]"   Gastroparesis    Gastropathy    GERD (gastroesophageal reflux disease)    Hypertension    MRSA infection    over 10 years ago per patient. in legs   Past Surgical History:  Procedure Laterality Date   AV FISTULA PLACEMENT  08/2011   left upper arm   BELOW KNEE LEG AMPUTATION  "it's been awhile"   bilaterally   CATARACT EXTRACTION  ~ 2011   right   COMBINED KIDNEY-PANCREAS TRANSPLANT  2014   ESOPHAGOGASTRODUODENOSCOPY N/A 12/30/2016   Procedure: ESOPHAGOGASTRODUODENOSCOPY (EGD);  Surgeon: Alvis Jourdain, MD;  Location: [facility] ENDOSCOPY;  Service: Endoscopy;  Laterality: N/A;   OLECRANON BURSECTOMY Right 06/23/2020   Procedure: RIGHT ELBOW EXCISION OLECRANON BURSITIS;  Surgeon: Timothy Ford, MD;  Location: [facility];  Service: Orthopedics;  Laterality: Right;   FAMILY HISTORY Family History  Problem Relation Age of Onset   Hypertension Mother    Diabetes Mother     Kidney disease Mother    Diabetes Maternal Grandmother    Diabetes Paternal Grandmother    Diabetes Other    Hypertension Other    Lung cancer Maternal Aunt    Colon cancer Neg Hx    Esophageal cancer Neg Hx    Rectal cancer Neg Hx    Stomach cancer Neg Hx    SOCIAL HISTORY Social History   Tobacco Use   Smoking status: Former    Types: Cigars    Quit date: 07/06/2022    Years since quitting: 1.8   Smokeless tobacco: Never   Tobacco comments:    black and mild  Vaping Use   Vaping status: Never Used  Substance Use  Topics   Alcohol use: No   Drug use: Not Currently    Frequency: 3.0 times per week    Types: Marijuana    Comment: Occasionally       OPHTHALMIC EXAM:  Base Eye Exam     Visual Acuity (Snellen - Linear)       Right Left   Dist cc 20/25 20/400   Dist ph cc NI 20/300    Correction: Glasses         Tonometry (Tonopen, 8:29 AM)       Right Left   Pressure 18 20         Pupils       Dark Light Shape React APD   Right 2 1 Round Minimal None   Left 2 1 Round Minimal None         Visual Fields       Left Right   Restrictions Total superior nasal, inferior nasal deficiencies Total superior nasal, inferior nasal deficiencies         Extraocular Movement       Right Left    Full, Ortho Full, Ortho         Neuro/Psych     Oriented x3: Yes   Mood/Affect: Normal         Dilation     Both eyes: 1.0% Mydriacyl, 2.5% Phenylephrine  @ 8:29 AM           Slit Lamp and Fundus Exam     Slit Lamp Exam       Right Left   Lids/Lashes Dermatochalasis - upper lid Dermatochalasis - upper lid   Conjunctiva/Sclera Melanosis Melanosis   Cornea well healed cataract wound, trace PEE, trace tear film debris well healed cataract wound, trace PEE, trace tear film debris   Anterior Chamber deep and clear deep and clear   Iris Round and moderately dilated, No NVI Round and moderately dilated, No NVI   Lens PC IOL in good position with  open PC PC IOL in good position with open PC   Anterior Vitreous Scattered blood stained vitreous condensations greatest inferiorly Old white blood stained vitreous condensations         Fundus Exam       Right Left   Disc 3+Pallor, Sharp rim, +fibrosis 3+Pallor, Sharp rim, +fibrosis, vascular loops / NVD   C/D Ratio 0.3 0.3   Macula Flat, Blunted foveal reflex, scattered MA / DBH, +atrophy Flat, Blunted foveal reflex, scattered fibrosis   Vessels attenuated, Tortuous, +NV severe attenuation, Tortuous, +NV   Periphery Attached, scattered MA / DBH Attached, scattered MA, diffuse atrophy           Refraction     Wearing Rx       Sphere Cylinder Axis Add   Right -2.25 +1.25 127 +1.25   Left -1.50 +1.00 045 +1.25           IMAGING AND PROCEDURES  Imaging and Procedures for 05/17/2024  OCT, Retina - OU - Both Eyes        Right Eye Quality was good. Central Foveal Thickness: 283. Progression has no prior data. Findings include no SRF, abnormal foveal contour, intraretinal fluid, inner retinal atrophy, vitreomacular adhesion (Diffuse IRA, central cystic changes, +vitreous opacities ).   Left Eye Quality was good. Central Foveal Thickness: 710. Progression has no prior data. Findings include no SRF, abnormal foveal contour, epiretinal membrane, intraretinal fluid, vitreous traction, preretinal fibrosis (Diffuse atrophy, prominent posterior hyaloid with subhyaloid opacities, focal  tractional edema along ST arcades).   Notes  *Images captured and stored on drive  Diagnosis / Impression:  OD: Diffuse IRA, central cystic changes, +vitreous opacities  OS: Diffuse atrophy, prominent posterior hyaloid with subhyaloid opacities, focal tractional edema along ST arcades  Clinical management:  See below  Abbreviations: NFP - Normal foveal profile. CME - cystoid macular edema. PED - pigment epithelial detachment. IRF - intraretinal fluid. SRF - subretinal fluid. EZ - ellipsoid  zone. ERM - epiretinal membrane. ORA - outer retinal atrophy. ORT - outer retinal tubulation. SRHM - subretinal hyper-reflective material. IRHM - intraretinal hyper-reflective material      Fluorescein  Angiography Optos (Transit OS)        Right Eye Progression has no prior data. Early phase findings include blockage, delayed filling, microaneurysm, vascular perfusion defect. Mid/Late phase findings include blockage, leakage, microaneurysm, retinal neovascularization, vascular perfusion defect (Large areas of vascular non-perfusion superior, nasal and inferior midzone, leaking NV superior and inferior to disc).   Left Eye Progression has no prior data. Mid/Late phase findings include blockage, leakage, window defect, retinal neovascularization, vascular perfusion defect (Large areas of vascular non-perfusion superior, nasal and inferior midzone, leaking NV greatest nasal to disc and along temporal arcades).   Notes  **Images stored on drive**  Impression: PDR OU OD: Large areas of vascular non-perfusion superior, nasal and inferior midzone, leaking NV superior and inferior to disc OS: Large areas of vascular non-perfusion superior, nasal and inferior midzone, leaking NV greatest nasal to disc and along temporal arcades      Intravitreal Injection, Pharmacologic Agent - OD - Right Eye       Time Out 05/17/2024. 10:03 AM. Confirmed correct patient, procedure, site, and patient consented.   Anesthesia Topical anesthesia was used. Anesthetic medications included Lidocaine  2%, Proparacaine 0.5%.   Procedure Preparation included 5% betadine to ocular surface, eyelid speculum. A supplied needle was used.   Injection: 1.25 mg Bevacizumab 1.25mg /0.37ml   Route: Intravitreal, Site: Right Eye   NDC: [id number], Lot: [id number], Expiration date: 06/14/2024   Post-op Post injection exam found visual acuity of at least counting fingers. The patient tolerated the procedure well. There were no  complications. The patient received written and verbal post procedure care education.      Intravitreal Injection, Pharmacologic Agent - OS - Left Eye       Time Out 05/17/2024. 10:16 AM. Confirmed correct patient, procedure, site, and patient consented.   Anesthesia Topical anesthesia was used. Anesthetic medications included Lidocaine  2%, Proparacaine 0.5%.   Procedure Preparation included 5% betadine to ocular surface, eyelid speculum. A (32g) needle was used.   Injection: 1.25 mg Bevacizumab 1.25mg /0.65ml   Route: Intravitreal, Site: Left Eye   NDC: C2662926, Lot: [id number], Expiration date: 07/30/2024   Post-op Post injection exam found visual acuity of at least counting fingers. The patient tolerated the procedure well. There were no complications. The patient received written and verbal post procedure care education.           ASSESSMENT/PLAN:   ICD-10-CM   1. Proliferative diabetic retinopathy of both eyes with macular edema associated with type 2 diabetes mellitus (HCC)  E11.3513 OCT, Retina - OU - Both Eyes    Intravitreal Injection, Pharmacologic Agent - OD - Right Eye    Intravitreal Injection, Pharmacologic Agent - OS - Left Eye    Bevacizumab (AVASTIN) SOLN 1.25 mg    Bevacizumab (AVASTIN) SOLN 1.25 mg    2. Optic neuropathy  H46.9     3. Essential  hypertension  I10     4. Hypertensive retinopathy of both eyes  H35.033 Fluorescein  Angiography Optos (Transit OS)    5. Pseudophakia, both eyes  Z96.1     6. History of stroke  Z86.73      **Pt reports decreased vision OU since having stroke in Sept 2024**  1. Proliferative diabetic retinopathy, both eyes  - last A1c 6.4 on 04.30.25 -- s/p kidney and pancreas transplant - The incidence, risk factors for progression, natural history and treatment options for diabetic retinopathy were discussed with patient.   - The need for close monitoring of blood glucose, blood pressure, and serum lipids, avoiding  cigarette or any type of tobacco, and the need for long term follow up was also discussed with patient. - exam shows blood stained vit condensations OU (OD red; OS white); scattered NV/fibrosis OU - FA (05.23.25) shows ZO:XWRUE areas of vascular non-perfusion superior, nasal and inferior midzone, leaking NV superior and inferior to disc; OS: Large areas of vascular non-perfusion superior, nasal and inferior midzone, leaking NV greatest nasal to disc and along temporal arcades -- pt will need PRP OU - OCT shows diabetic macular edema, both eyes  - The natural history, pathology, and characteristics of diabetic macular edema discussed with patient.  A generalized discussion of the major clinical trials concerning treatment of diabetic macular edema (ETDRS, DCT, SCORE, RISE / RIDE, and ongoing DRCR net studies) was completed.  This discussion included mention of the various approaches to treating diabetic macular edema (observation, laser photocoagulation, anti-VEGF injections with lucentis / Avastin / Eylea, steroid injections with Kenalog / Ozurdex , and intraocular surgery with vitrectomy).  The goal hemoglobin A1C of 6-7 was discussed, as well as importance of smoking cessation and hypertension control.  Need for ongoing treatment and monitoring were specifically discussed with reference to chronic nature of diabetic macular edema. - recommend IVA OU #1 today, 05.23.25 for DME and VH OU - pt wishes to proceed with injections - RBA of procedure discussed, questions answered - IVA informed consent obtained and signed, 05.23.25 - see procedure note - f/u 4 weeks -- DFE/OCT, possible injection  2. Bilateral optic neuropathy  - thought to be secondary to tacrolimus  use  - previously managed and monitored by Dr. Ike Malady visit 2022  - exam shows 3+ disc pallor  - monitor  3,4. Hypertensive retinopathy OU - discussed importance of tight BP control - monitor  5. Pseudophakia OU  - s/p CE/IOL  -  IOL in good position, doing well  - monitor  6. H/o CVA - Sept .2024  - occurred while receiving belatacept  infusion  - MRI Brain 09.23.24 IMPRESSION: 1. Scattered small foci of acute subcortical infarction in the left occipital lobe and posterior left frontal lobe. 2. Faint diffusion signal abnormality along the left parietal lobe may reflect evolving infarct versus seizure related cytotoxic edema. 3. 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    Ophthalmic Meds Ordered this visit:  Meds ordered this encounter  Medications   Bevacizumab (AVASTIN) SOLN 1.25 mg   Bevacizumab (AVASTIN) SOLN 1.25 mg     Return in about 4 weeks (around 06/14/2024) for f/u PDR OU, DFE, OCT, Possible Injxn.  There are no Patient Instructions on file for this visit.  Explained the diagnoses, plan, and follow up with the patient and they expressed understanding.  Patient expressed understanding of the importance of proper follow up care.   This document serves as a record of services personally performed by  Jeanice Millard, MD, PhD. It was created on their behalf by Morley Arabia. Bevin Bucks, OA an ophthalmic technician. The creation of this record is the provider's dictation and/or activities during the visit.    Electronically signed by: Morley Arabia. Bevin Bucks, OA 05/17/24 12:02 PM   Jeanice Millard, M.D., Ph.D. Diseases & Surgery of the Retina and Vitreous [facility] 05/17/2024  I have reviewed the above documentation for accuracy and completeness, and I agree with the above. Jeanice Millard, M.D., Ph.D. 05/17/24 12:14 PM   Abbreviations: M myopia (nearsighted); A astigmatism; H hyperopia (farsighted); P presbyopia; Mrx spectacle prescription;  CTL contact lenses; OD right eye; OS left eye; OU both eyes  XT exotropia; ET esotropia; PEK punctate epithelial keratitis; PEE punctate epithelial erosions; DES dry eye syndrome; MGD meibomian gland dysfunction; ATs  artificial tears; PFAT's preservative free artificial tears; NSC nuclear sclerotic cataract; PSC posterior subcapsular cataract; ERM epi-retinal membrane; PVD posterior vitreous detachment; RD retinal detachment; DM diabetes mellitus; DR diabetic retinopathy; NPDR non-proliferative diabetic retinopathy; PDR proliferative diabetic retinopathy; CSME clinically significant macular edema; DME diabetic macular edema; dbh dot blot hemorrhages; CWS cotton wool spot; POAG primary open angle glaucoma; C/D cup-to-disc ratio; HVF humphrey visual field; GVF goldmann visual field; OCT optical coherence tomography; IOP intraocular pressure; BRVO Branch retinal vein occlusion; CRVO central retinal vein occlusion; CRAO central retinal artery occlusion; BRAO branch retinal artery occlusion; RT retinal tear; SB scleral buckle; PPV pars plana vitrectomy; VH Vitreous hemorrhage; PRP panretinal laser photocoagulation; IVK intravitreal kenalog; VMT vitreomacular traction; MH Macular hole;  NVD neovascularization of the disc; NVE neovascularization elsewhere; AREDS age related eye disease study; ARMD age related macular degeneration; POAG primary open angle glaucoma; EBMD epithelial/anterior basement membrane dystrophy; ACIOL anterior chamber intraocular lens; IOL intraocular lens; PCIOL posterior chamber intraocular lens; Phaco/IOL phacoemulsification with intraocular lens placement; PRK photorefractive keratectomy; LASIK laser assisted in situ keratomileusis; HTN hypertension; DM diabetes mellitus; COPD chronic obstructive pulmonary disease

## 2024-05-21 ENCOUNTER — Other Ambulatory Visit: Payer: Self-pay

## 2024-05-22 ENCOUNTER — Other Ambulatory Visit: Payer: Self-pay | Admitting: Internal Medicine

## 2024-05-22 DIAGNOSIS — Z940 Kidney transplant status: Secondary | ICD-10-CM

## 2024-05-28 ENCOUNTER — Other Ambulatory Visit: Payer: Self-pay

## 2024-05-29 ENCOUNTER — Other Ambulatory Visit: Payer: Self-pay

## 2024-05-31 ENCOUNTER — Other Ambulatory Visit: Payer: Self-pay

## 2024-06-06 NOTE — Progress Notes (Signed)
 [facility] - Clinic Note  06/17/2024   CHIEF COMPLAINT Patient presents for Retina Follow Up  HISTORY OF PRESENT ILLNESS: [name] is a 48 y.o. male who presents to the clinic today for:  HPI     Retina Follow Up   Patient presents with  Diabetic Retinopathy.  In both eyes.  This started 4 weeks ago.  I, the attending physician,  performed the HPI with the patient and updated documentation appropriately.        Comments   Patient here for 4 weeks retina follow up for PDR OU. Patient states vision  is there. No eye pain.       Last edited by [name], MD on 06/17/2024  8:32 PM.    Pt states he had no issues s/p inj OU. Can tell improvement in [place] reading the chart today.   Referring physician: [name], MD [address] [place],  [place] [address]  HISTORICAL INFORMATION:  Selected notes from the MEDICAL RECORD NUMBER Referred by Dr. Hamilton Octavia for PDR OU LEE:  Ocular Hx- previously followed with Dr. 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   No current facility-administered medications for this visit. (Ophthalmic Drugs)   Current Outpatient Medications (Other)  Medication Sig   acetaminophen  (TYLENOL ) 325 MG tablet Take 1-2 tablets (325-650 mg total) by mouth every 4 (four) hours as needed for mild pain (pain score 1-3).   aspirin  EC 81 MG tablet Take 1 tablet (81 mg total) by mouth daily. Swallow whole.   atorvastatin  (LIPITOR) 40 MG tablet Take 1 tablet (40 mg total) by mouth daily.   carvedilol  (COREG ) 12.5 MG tablet Take 1 tablet (12.5 mg total) by mouth 2 (two) times daily with a meal.    cycloSPORINE  modified (NEORAL ) 100 MG capsule Take 100 mg by mouth 2 (two) times daily. Taken with 50 mg tablet   cycloSPORINE  modified (NEORAL ) 50 MG capsule Take 1 capsule by mouth two times daily, Take along with 100 mg twice a day   hydrALAZINE  (APRESOLINE ) 50 MG tablet Take 1 tablet (50 mg total) by mouth 3 (three) times daily.   mycophenolate  (MYFORTIC ) 180 MG EC tablet TAKE 1 TABLET BY MOUTH TWICE DAILY   naloxone  (NARCAN ) nasal spray 4 mg/0.1 mL Inhale 1 (one) Spray if poorly responding or lips turning blue   naloxone  (NARCAN ) nasal spray 4 mg/0.1 mL Place 1 spray if poorly responding or lips turning blue   nystatin  (MYCOSTATIN ) 100000 UNIT/ML suspension Swish and swallow 5 mls four times daily for 14 days   oxyCODONE  (ROXICODONE ) 15 MG immediate release tablet Take 1 tablet (15 mg total) by mouth in the morning, at noon, in the evening, and at bedtime if needed for pain   Oxycodone  HCl 10 MG TABS Take 0.5-1 tablets (5-10 mg total) by mouth 5 (five) times daily as needed,   pantoprazole  (PROTONIX ) 40 MG tablet TAKE 1 TABLET(40 MG) BY MOUTH TWICE DAILY   predniSONE  (DELTASONE ) 5 MG tablet Take 5 mg by mouth daily.   sulfamethoxazole -trimethoprim  (BACTRIM ) 400-80 MG tablet Take 1 tablet by mouth every Monday, Wednesday, and Friday.   terazosin  (HYTRIN )  2 MG capsule Take 2 capsules (4 mg total) by mouth at bedtime.   fluconazole  (DIFLUCAN ) 200 MG tablet Take 1 tablet (200 mg total) by mouth daily. (Patient not taking: Reported on 04/23/2024)   No current facility-administered medications for this visit. (Other)   REVIEW OF SYSTEMS: ROS   Positive for: Musculoskeletal, Eyes Last edited by Orval Asberry RAMAN, COA on 06/17/2024  2:59 PM.     ALLERGIES Allergies  Allergen Reactions   Belatacept  Anaphylaxis    hypotensive with altered reaction and required epinephrine .  Needed to be intubated for airway protection.   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   Diabetes mellitus without complication [facility])    no since pancreas transplant   Dialysis patient [facility]) 04/18/2012   [facility]; [place], [place], Sat   Gastroparesis    Gastropathy    GERD (gastroesophageal reflux disease)    Hypertension    MRSA infection    over 10 years ago per patient. in legs   Past Surgical History:  Procedure Laterality Date   AV FISTULA PLACEMENT  08/2011   left upper arm   BELOW KNEE LEG AMPUTATION  it's been awhile   bilaterally   CATARACT EXTRACTION  ~ 2011   right   COMBINED KIDNEY-PANCREAS TRANSPLANT  2014   ESOPHAGOGASTRODUODENOSCOPY N/A 12/30/2016   Procedure: ESOPHAGOGASTRODUODENOSCOPY (EGD);  Surgeon: Belvie Just, MD;  Location: [facility] ENDOSCOPY;  Service: Endoscopy;  Laterality: N/A;   OLECRANON BURSECTOMY Right 06/23/2020   Procedure: RIGHT ELBOW EXCISION OLECRANON BURSITIS;  Surgeon: Harden Jerona GAILS, MD;  Location: [facility];  Service: Orthopedics;  Laterality: Right;   FAMILY HISTORY Family History  Problem Relation Age of Onset   Hypertension Mother    Diabetes Mother    Kidney disease Mother    Diabetes Maternal Grandmother    Diabetes Paternal Grandmother    Diabetes Other    Hypertension Other    Lung cancer Maternal Aunt    Colon cancer Neg Hx    Esophageal cancer Neg Hx    Rectal cancer Neg Hx    Stomach cancer Neg Hx    SOCIAL HISTORY Social History   Tobacco Use   Smoking status: Former    Types: Cigars    Quit date: 07/06/2022    Years since quitting: 1.9   Smokeless tobacco: Never   Tobacco comments:    black and mild  Vaping Use   Vaping status: Never Used  Substance Use Topics   Alcohol use: No   Drug use: Not Currently    Frequency: 3.0 times  per week    Types: Marijuana    Comment: Occasionally       OPHTHALMIC EXAM:  Base Eye Exam     Visual Acuity (Snellen - Linear)       Right Left   Dist cc 20/25 20/250   Dist ph cc  20/100         Tonometry (Tonopen, 2:56 PM)       Right Left   Pressure 21 28,26,24  Brimonidine given and Cosopt given OS @ 2:57 pm.  Pupils       Dark Light Shape React APD   Right 2 1 Round Minimal None   Left 2 1 Round Minimal None         Visual Fields (Counting fingers)       Left Right   Restrictions Total superior nasal, inferior nasal deficiencies Total superior nasal, inferior nasal deficiencies         Extraocular Movement       Right Left    Full, Ortho Full, Ortho         Neuro/Psych     Oriented x3: Yes   Mood/Affect: Normal         Dilation     Both eyes: 1.0% Mydriacyl, 2.5% Phenylephrine  @ 2:56 PM           Slit Lamp and Fundus Exam     Slit Lamp Exam       Right Left   Lids/Lashes Dermatochalasis - upper lid Dermatochalasis - upper lid   Conjunctiva/Sclera Melanosis Melanosis   Cornea well healed cataract wound, trace PEE, trace tear film debris well healed cataract wound, trace PEE, trace tear film debris   Anterior Chamber deep and clear deep and clear   Iris Round and moderately dilated, No NVI Round and moderately dilated, No NVI   Lens PC IOL in good position with open PC PC IOL in good position with open PC   Anterior Vitreous Scattered blood stained vitreous condensations-improving and settling inferiorly Old white blood stained vitreous condensations-improving and settling inferiorly         Fundus Exam       Right Left   Disc 3+Pallor, Sharp rim, +fibrosis 3+Pallor, Sharp rim, +fibrosis, vascular loops / NVD-regressing   C/D Ratio 0.3 0.3   Macula Flat, Blunted foveal reflex, scattered MA / DBH, +atrophy Flat, good foveal reflex, scattered fibrosis, scattered MA/DBH   Vessels attenuated, Tortuous, +NV severe  attenuation, Tortuous, +NV   Periphery Attached, scattered MA / DBH Attached, scattered MA, diffuse atrophy           Refraction     Wearing Rx       Sphere Cylinder Axis Add   Right -2.25 +1.25 127 +1.25   Left -1.50 +1.00 045 +1.25           IMAGING AND PROCEDURES  Imaging and Procedures for 06/17/2024  OCT, Retina - OU - Both Eyes       Right Eye Quality was good. Central Foveal Thickness: 269. Progression has improved. Findings include no SRF, abnormal foveal contour, intraretinal fluid, inner retinal atrophy, vitreomacular adhesion (Diffuse IRA, central cystic changes--slightly improved, +vitreous opacities- improved).   Left Eye Quality was good. Central Foveal Thickness: 245. Progression has improved. Findings include no SRF, abnormal foveal contour, epiretinal membrane, intraretinal fluid, vitreous traction, preretinal fibrosis (Diffuse atrophy, prominent posterior hyaloid with interval improvement in subhyaloid opacities, focal tractional edema along ST arcades).   Notes *Images captured and stored on drive  Diagnosis / Impression:  OD: Diffuse IRA, central cystic changes--slightly improved, +vitreous opacities- improved OS: Diffuse atrophy, prominent posterior hyaloid with interval improvement subhyaloid opacities, focal tractional edema along ST arcades  Clinical management:  See below  Abbreviations: NFP - Normal foveal profile. CME - cystoid macular edema. PED - pigment epithelial detachment. IRF - intraretinal fluid. SRF - subretinal fluid. EZ - ellipsoid zone. ERM - epiretinal membrane. ORA - outer retinal atrophy. ORT - outer retinal tubulation. SRHM - subretinal hyper-reflective material. IRHM - intraretinal  hyper-reflective material      Intravitreal Injection, Pharmacologic Agent - OD - Right Eye       Time Out 06/17/2024. 3:45 PM. Confirmed correct patient, procedure, site, and patient consented.   Anesthesia Topical anesthesia was used.  Anesthetic medications included Lidocaine  2%, Proparacaine 0.5%.   Procedure Preparation included 5% betadine to ocular surface, eyelid speculum. A (32g) needle was used.   Injection: 1.25 mg Bevacizumab  1.25mg /0.74ml   Route: Intravitreal, Site: Right Eye   NDC: C2662926, Lot: [id number], Expiration date: 09/23/2024   Post-op Post injection exam found visual acuity of at least counting fingers. The patient tolerated the procedure well. There were no complications. The patient received written and verbal post procedure care education.      Intravitreal Injection, Pharmacologic Agent - OS - Left Eye       Time Out 06/17/2024. 3:49 PM. Confirmed correct patient, procedure, site, and patient consented.   Anesthesia Topical anesthesia was used. Anesthetic medications included Lidocaine  2%, Proparacaine 0.5%.   Procedure Preparation included 5% betadine to ocular surface, eyelid speculum. A (32g) needle was used.   Injection: 1.25 mg Bevacizumab  1.25mg /0.42ml   Route: Intravitreal, Site: Left Eye   NDC: C2662926, Lot: [id number], Expiration date: 01/11/2025   Post-op Post injection exam found visual acuity of at least counting fingers. The patient tolerated the procedure well. There were no complications. The patient received written and verbal post procedure care education.           ASSESSMENT/PLAN:   ICD-10-CM   1. Proliferative diabetic retinopathy of both eyes with macular edema associated with type 2 diabetes mellitus (HCC)  E11.3513 OCT, Retina - OU - Both Eyes    Intravitreal Injection, Pharmacologic Agent - OD - Right Eye    Intravitreal Injection, Pharmacologic Agent - OS - Left Eye    Bevacizumab  (AVASTIN ) SOLN 1.25 mg    Bevacizumab  (AVASTIN ) SOLN 1.25 mg    2. Optic neuropathy  H46.9     3. Essential hypertension  I10     4. Hypertensive retinopathy of both eyes  H35.033     5. 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 Pseudophakia, both eyes  Z96.1      7. History of stroke  Z86.73      **Pt reports decreased vision OU since having stroke in Sept 2024**  1. Proliferative diabetic retinopathy, both eyes  - IVA OU #1 (05.23.25)  - last A1c 6.4 on 04.30.25 -- s/p kidney and pancreas transplant - exam shows blood stained vit condensations OU (OD red; OS white); scattered NV/fibrosis OU - FA (05.23.25) shows OD: Large areas of vascular non-perfusion superior, nasal and inferior midzone, leaking NV superior and inferior to disc; OS: Large areas of vascular non-perfusion superior, nasal and inferior midzone, leaking NV greatest nasal to disc and along temporal arcades -- pt will need PRP OU - OCT shows OD: Diffuse IRA, central cystic changes--slightly improved, +vitreous opacities- improved; OS: Diffuse atrophy, prominent posterior hyaloid with interval improvement subhyaloid opacities, focal tractional edema along ST arcades - BCVA OD 20/25 - stable; OS 20/100 from 20//300 - recommend IVA OU #2 today, 06.23.25 for DME and VH OU - pt wishes to proceed with injections - RBA of procedure discussed, questions answered - IVA informed consent obtained and signed, 05.23.25 - see procedure note - f/u 4 weeks -- DFE/OCT, possible injection  2. Bilateral optic neuropathy  - thought to be secondary to tacrolimus  use  - previously managed and monitored by  Dr. Merita visit 2022  - exam shows 3+ disc pallor OU  - monitor  3,4. Hypertensive retinopathy OU - discussed importance of tight BP control - monitor  5. Ocular Hypertension OU  -IOP 21, 28/26  -begin Brimonidine BID OU -monitor  6. Pseudophakia OU  - s/p CE/IOL  - IOL in good position, doing well  - monitor  7. H/o CVA - Sept .2024  - occurred while receiving belatacept  infusion  - MRI Brain 09.23.24 IMPRESSION: 1. Scattered small foci of acute subcortical infarction in the left occipital lobe and posterior left frontal lobe. 2. Faint diffusion signal abnormality along the  left parietal lobe may reflect evolving infarct versus seizure related cytotoxic edema. 3. 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    Ophthalmic Meds Ordered this visit:  Meds ordered this encounter  Medications   brimonidine (ALPHAGAN) 0.2 % ophthalmic solution    Sig: Place 1 drop into both eyes in the morning and at bedtime.    Dispense:  5 mL    Refill:  3   Bevacizumab  (AVASTIN ) SOLN 1.25 mg   Bevacizumab  (AVASTIN ) SOLN 1.25 mg     Return in about 4 weeks (around 07/15/2024) for PDR OU, DFE, OCT, Possible Injxn.  There are no Patient Instructions on file for this visit.  Explained the diagnoses, plan, and follow up with the patient and they expressed understanding.  Patient expressed understanding of the importance of proper follow up care.   This document serves as a record of services personally performed by Redell JUDITHANN Hans, MD, PhD. It was created on their behalf by Avelina Pereyra, COA an ophthalmic technician. The creation of this record is the provider's dictation and/or activities during the visit.   Electronically signed by: Avelina GORMAN Pereyra, COT  06/17/24  8:34 PM   Redell JUDITHANN Hans, M.D., Ph.D. Diseases & Surgery of the Retina and Vitreous [facility] 06/17/2024  I have reviewed the above documentation for accuracy and completeness, and I agree with the above. Redell JUDITHANN Hans, M.D., Ph.D. 06/17/24 8:37 PM   Abbreviations: M myopia (nearsighted); A astigmatism; H hyperopia (farsighted); P presbyopia; Mrx spectacle prescription;  CTL contact lenses; OD right eye; OS left eye; OU both eyes  XT exotropia; ET esotropia; PEK punctate epithelial keratitis; PEE punctate epithelial erosions; DES dry eye syndrome; MGD meibomian gland dysfunction; ATs artificial tears; PFAT's preservative free artificial tears; NSC nuclear sclerotic cataract; PSC posterior subcapsular cataract; ERM epi-retinal membrane; PVD posterior vitreous  detachment; RD retinal detachment; DM diabetes mellitus; DR diabetic retinopathy; NPDR non-proliferative diabetic retinopathy; PDR proliferative diabetic retinopathy; CSME clinically significant macular edema; DME diabetic macular edema; dbh dot blot hemorrhages; CWS cotton wool spot; POAG primary open angle glaucoma; C/D cup-to-disc ratio; HVF humphrey visual field; GVF goldmann visual field; OCT optical coherence tomography; IOP intraocular pressure; BRVO Branch retinal vein occlusion; CRVO central retinal vein occlusion; CRAO central retinal artery occlusion; BRAO branch retinal artery occlusion; RT retinal tear; SB scleral buckle; PPV pars plana vitrectomy; VH Vitreous hemorrhage; PRP panretinal laser photocoagulation; IVK intravitreal kenalog; VMT vitreomacular traction; MH Macular hole;  NVD neovascularization of the disc; NVE neovascularization elsewhere; AREDS age related eye disease study; ARMD age related macular degeneration; POAG primary open angle glaucoma; EBMD epithelial/anterior basement membrane dystrophy; ACIOL anterior chamber intraocular lens; IOL intraocular lens; PCIOL posterior chamber intraocular lens; Phaco/IOL phacoemulsification with intraocular lens placement; PRK photorefractive keratectomy; LASIK laser assisted in situ keratomileusis; HTN hypertension; DM diabetes mellitus; COPD  chronic obstructive pulmonary disease

## 2024-06-11 ENCOUNTER — Telehealth: Payer: Self-pay | Admitting: Adult Health

## 2024-06-11 NOTE — Telephone Encounter (Signed)
 Pt wife called to reschedule appt   Appt Scheduled

## 2024-06-17 ENCOUNTER — Ambulatory Visit (INDEPENDENT_AMBULATORY_CARE_PROVIDER_SITE_OTHER): Admitting: Ophthalmology

## 2024-06-17 ENCOUNTER — Encounter (INDEPENDENT_AMBULATORY_CARE_PROVIDER_SITE_OTHER): Payer: Self-pay | Admitting: Ophthalmology

## 2024-06-17 DIAGNOSIS — H35033 Hypertensive retinopathy, bilateral: Secondary | ICD-10-CM | POA: Diagnosis not present

## 2024-06-17 DIAGNOSIS — Z961 Presence of intraocular lens: Secondary | ICD-10-CM

## 2024-06-17 DIAGNOSIS — H40053 Ocular hypertension, bilateral: Secondary | ICD-10-CM | POA: Diagnosis not present

## 2024-06-17 DIAGNOSIS — E113513 Type 2 diabetes mellitus with proliferative diabetic retinopathy with macular edema, bilateral: Secondary | ICD-10-CM | POA: Diagnosis not present

## 2024-06-17 DIAGNOSIS — H469 Unspecified optic neuritis: Secondary | ICD-10-CM | POA: Diagnosis not present

## 2024-06-17 DIAGNOSIS — I10 Essential (primary) hypertension: Secondary | ICD-10-CM

## 2024-06-17 DIAGNOSIS — Z8673 Personal history of transient ischemic attack (TIA), and cerebral infarction without residual deficits: Secondary | ICD-10-CM

## 2024-06-17 MED ORDER — BEVACIZUMAB CHEMO INJECTION 1.25MG/0.05ML SYRINGE FOR KALEIDOSCOPE
1.2500 mg | INTRAVITREAL | Status: AC | PRN
  Administered 2024-06-17: 1.25 mg via INTRAVITREAL

## 2024-06-17 MED ORDER — BRIMONIDINE TARTRATE 0.2 % OP SOLN: 1.0000 [drp] | Freq: Two times a day (BID) | OPHTHALMIC | 3 refills | Status: AC

## 2024-06-18 ENCOUNTER — Ambulatory Visit: Payer: 59 | Admitting: Neurology

## 2024-06-18 ENCOUNTER — Other Ambulatory Visit: Payer: Self-pay

## 2024-06-18 DIAGNOSIS — G890 Central pain syndrome: Secondary | ICD-10-CM | POA: Diagnosis not present

## 2024-06-18 DIAGNOSIS — M549 Dorsalgia, unspecified: Secondary | ICD-10-CM | POA: Diagnosis not present

## 2024-06-18 DIAGNOSIS — G546 Phantom limb syndrome with pain: Secondary | ICD-10-CM | POA: Diagnosis not present

## 2024-06-18 DIAGNOSIS — G8191 Hemiplegia, unspecified affecting right dominant side: Secondary | ICD-10-CM | POA: Diagnosis not present

## 2024-06-18 DIAGNOSIS — S88119A Complete traumatic amputation at level between knee and ankle, unspecified lower leg, initial encounter: Secondary | ICD-10-CM | POA: Diagnosis not present

## 2024-06-18 DIAGNOSIS — G894 Chronic pain syndrome: Secondary | ICD-10-CM | POA: Diagnosis not present

## 2024-06-18 DIAGNOSIS — Z79899 Other long term (current) drug therapy: Secondary | ICD-10-CM | POA: Diagnosis not present

## 2024-06-18 DIAGNOSIS — Z9483 Pancreas transplant status: Secondary | ICD-10-CM | POA: Diagnosis not present

## 2024-06-18 DIAGNOSIS — N1831 Chronic kidney disease, stage 3a: Secondary | ICD-10-CM | POA: Diagnosis not present

## 2024-06-18 MED ORDER — OXYCODONE HCL 15 MG PO TABS
15.0000 mg | ORAL_TABLET | Freq: Four times a day (QID) | ORAL | 0 refills | Status: DC | PRN
  Filled 2024-06-20: qty 120, 30d supply, fill #0

## 2024-06-20 ENCOUNTER — Other Ambulatory Visit: Payer: Self-pay

## 2024-06-20 NOTE — Progress Notes (Signed)
 [facility] - Clinic Note  06/21/2024   CHIEF COMPLAINT Patient presents for Retina Evaluation  HISTORY OF PRESENT ILLNESS: [name] is a 48 y.o. male who presents to the clinic today for:  HPI     Retina Evaluation   In left eye.  This started 1 day ago.  Duration of 1 day.  Associated Symptoms Floaters and Pain.  I, the attending physician,  performed the HPI with the patient and updated documentation appropriately.        Comments   Patient here for retina evaluation. Patient states vision ok. Started yesterday SM OS has a  little bit of pain. Using drops TID OU that was given.       Last edited by Andrew Rogue, MD on 06/23/2024  3:07 PM.     Pt presents acutely today for pain OS, pts wife states was fine until yesterday and then he called her at work and said his eye was very painful, they have been using the drops they were given, pt states his eye is burning, wife states yesterday, he said he felt like something was in it and he couldn't get it out, pt states it hurts when he puts brimonidine  in his left eye  Referring physician: [name], MD [address] [place],  [place] [address]  HISTORICAL INFORMATION:  Selected notes from the MEDICAL RECORD NUMBER Referred by Dr. Hamilton Andrew for PDR OU LEE:  Ocular Hx- previously followed with Dr. 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   dorzolamide-timolol (COSOPT) 2-0.5 % ophthalmic solution Place 1 drop into both eyes 2 (two) times daily.   No current facility-administered medications for this visit. (Ophthalmic Drugs)   Current Outpatient Medications  (Other)  Medication Sig   acetaminophen  (TYLENOL ) 325 MG tablet Take 1-2 tablets (325-650 mg total) by mouth every 4 (four) hours as needed for mild pain (pain score 1-3).   aspirin  EC 81 MG tablet Take 1 tablet (81 mg total) by mouth daily. Swallow whole.   atorvastatin  (LIPITOR) 40 MG tablet Take 1 tablet (40 mg total) by mouth daily.   carvedilol  (COREG ) 12.5 MG tablet Take 1 tablet (12.5 mg total) by mouth 2 (two) times daily with a meal.   cycloSPORINE  modified (NEORAL ) 100 MG capsule Take 100 mg by mouth 2 (two) times daily. Taken with 50 mg tablet   cycloSPORINE  modified (NEORAL ) 50 MG capsule Take 1 capsule by mouth two times daily, Take along with 100 mg twice a day   hydrALAZINE  (APRESOLINE ) 50 MG tablet Take 1 tablet (50 mg total) by mouth 3 (three) times daily.   mycophenolate  (MYFORTIC ) 180 MG EC tablet TAKE 1 TABLET BY MOUTH TWICE DAILY   naloxone  (NARCAN ) nasal spray 4 mg/0.1 mL Inhale 1 (one) Spray if poorly responding or lips turning blue   naloxone  (NARCAN ) nasal spray 4 mg/0.1 mL Place 1 spray if poorly responding or lips turning blue   nystatin  (MYCOSTATIN ) 100000 UNIT/ML suspension Swish and swallow 5 mls four times daily for 14 days   oxyCODONE  (ROXICODONE ) 15 MG immediate release tablet Take 1 tablet (15 mg total) by mouth in the morning,  at noon, in the evening, and at bedtime if needed for pain   oxyCODONE  (ROXICODONE ) 15 MG immediate release tablet Take 1 tablet (15 mg total) by mouth 4 (four) times daily as needed for pain.   Oxycodone  HCl 10 MG TABS Take 0.5-1 tablets (5-10 mg total) by mouth 5 (five) times daily as needed,   pantoprazole  (PROTONIX ) 40 MG tablet TAKE 1 TABLET(40 MG) BY MOUTH TWICE DAILY   predniSONE  (DELTASONE ) 5 MG tablet Take 5 mg by mouth daily.   sulfamethoxazole -trimethoprim  (BACTRIM ) 400-80 MG tablet Take 1 tablet by mouth every Monday, Wednesday, and Friday.   terazosin  (HYTRIN ) 2 MG capsule Take 2 capsules (4 mg total) by mouth at bedtime.    fluconazole  (DIFLUCAN ) 200 MG tablet Take 1 tablet (200 mg total) by mouth daily. (Patient not taking: Reported on 04/23/2024)   No current facility-administered medications for this visit. (Other)   REVIEW OF SYSTEMS: ROS   Positive for: Musculoskeletal, Eyes Last edited by [name], COA on 06/21/2024  8:35 AM.     ALLERGIES Allergies  Allergen Reactions   Belatacept  Anaphylaxis    hypotensive with altered reaction and required epinephrine .  Needed to be intubated for airway protection.   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   Diabetes mellitus without complication [facility])    no since pancreas transplant   Dialysis patient [facility]) 04/18/2012   [name]; [place], [place], [place]   Gastroparesis    Gastropathy    GERD (gastroesophageal reflux disease)    Hypertension    MRSA infection    over 10 years ago per patient. in legs   Past Surgical History:  Procedure Laterality Date   AV FISTULA PLACEMENT  08/2011   left upper arm   BELOW KNEE LEG AMPUTATION  it's been awhile   bilaterally   CATARACT EXTRACTION  ~ 2011   right   COMBINED KIDNEY-PANCREAS TRANSPLANT  2014   ESOPHAGOGASTRODUODENOSCOPY N/A 12/30/2016   Procedure: ESOPHAGOGASTRODUODENOSCOPY (EGD);  Surgeon: Belvie Just, MD;  Location: [facility] ENDOSCOPY;  Service: Endoscopy;  Laterality: N/A;   OLECRANON BURSECTOMY Right 06/23/2020   Procedure: RIGHT ELBOW EXCISION OLECRANON BURSITIS;  Surgeon: Harden Jerona GAILS, MD;  Location: [facility];  Service: Orthopedics;  Laterality: Right;   FAMILY HISTORY Family History  Problem Relation Age of Onset   Hypertension Mother    Diabetes Mother    Kidney disease Mother     Diabetes Maternal Grandmother    Diabetes Paternal Grandmother    Diabetes Other    Hypertension Other    Lung cancer Maternal Aunt    Colon cancer Neg Hx    Esophageal cancer Neg Hx    Rectal cancer Neg Hx    Stomach cancer Neg Hx    SOCIAL HISTORY Social History   Tobacco Use   Smoking status: Former    Types: Cigars    Quit date: 07/06/2022    Years since quitting: 1.9   Smokeless tobacco: Never   Tobacco comments:    black and mild  Vaping Use   Vaping status: Never Used  Substance Use Topics   Alcohol  use: No   Drug use: Not Currently    Frequency: 3.0 times per week    Types: Marijuana    Comment: Occasionally       OPHTHALMIC EXAM:  Base Eye Exam     Visual Acuity (Snellen - Linear)       Right Left   Dist cc 20/20 20/150 -2   Dist ph cc  NI    Correction: Glasses         Tonometry (Tonopen, 8:32 AM)       Right Left   Pressure 18 20         Pupils       Dark Light Shape React APD   Right 2 1 Round Minimal None   Left 2 1 Round Minimal None         Visual Fields (Counting fingers)       Left Right    Full Full         Extraocular Movement       Right Left    Full, Ortho Full, Ortho         Neuro/Psych     Oriented x3: Yes   Mood/Affect: Normal         Dilation     Left eye: 1.0% Mydriacyl, 2.5% Phenylephrine  @ 8:32 AM           Slit Lamp and Fundus Exam     Slit Lamp Exam       Right Left   Lids/Lashes Dermatochalasis - upper lid Dermatochalasis - upper lid   Conjunctiva/Sclera Melanosis Melanosis   Cornea well healed cataract wound, trace PEE, trace tear film debris well healed cataract wound, trace PEE, tear film debris   Anterior Chamber deep and clear deep and clear   Iris Round and moderately dilated, No NVI Round and moderately dilated, No NVI   Lens PC IOL in good position with open PC PC IOL in good position with open PC   Anterior Vitreous Scattered blood stained vitreous condensations-improving  and settling inferiorly Old white blood stained vitreous condensations-improving and settling inferiorly         Fundus Exam       Right Left   Disc 3+Pallor, Sharp rim, +fibrosis 3+Pallor, Sharp rim, +fibrosis, vascular loops / NVD-regressing   C/D Ratio 0.3 0.3   Macula Flat, Blunted foveal reflex, scattered MA / DBH, +atrophy Flat, good foveal reflex, scattered fibrosis, scattered MA/DBH   Vessels attenuated, Tortuous, +NV severe attenuation, Tortuous, +NV   Periphery Attached, scattered MA / DBH Attached, scattered MA, diffuse atrophy           Refraction     Wearing Rx       Sphere Cylinder Axis Add   Right -2.25 +1.25 127 +1.25   Left -1.50 +1.00 045 +1.25           IMAGING AND PROCEDURES  Imaging and Procedures for 06/21/2024  OCT, Retina - OU - Both Eyes       Right Eye Quality was good. Central Foveal Thickness: 275. Progression has improved. Findings include no SRF, abnormal foveal contour, intraretinal fluid, inner retinal atrophy, vitreomacular adhesion (Diffuse IRA, central cystic changes, +vitreous opacities- slightly improved).   Left Eye Quality was good. Central Foveal Thickness: 245. Progression has been stable. Findings include no SRF, abnormal foveal contour, epiretinal membrane, intraretinal fluid, vitreous traction, preretinal fibrosis (Diffuse atrophy, prominent posterior hyaloid with mild interval improvement in subhyaloid opacities, focal tractional edema along ST arcades).  Notes *Images captured and stored on drive  Diagnosis / Impression:  OD: Diffuse IRA, central cystic changes, +vitreous opacities- slightly improved OS: Diffuse atrophy, prominent posterior hyaloid with mild interval improvement in subhyaloid opacities, focal tractional edema along ST arcades  Clinical management:  See below  Abbreviations: NFP - Normal foveal profile. CME - cystoid macular edema. PED - pigment epithelial detachment. IRF - intraretinal fluid. SRF -  subretinal fluid. EZ - ellipsoid zone. ERM - epiretinal membrane. ORA - outer retinal atrophy. ORT - outer retinal tubulation. SRHM - subretinal hyper-reflective material. IRHM - intraretinal hyper-reflective material           ASSESSMENT/PLAN:   ICD-10-CM   1. Proliferative diabetic retinopathy of both eyes with macular edema associated with type 2 diabetes mellitus (HCC)  E11.3513 OCT, Retina - OU - Both Eyes    2. Optic neuropathy  H46.9     3. Essential hypertension  I10     4. Hypertensive retinopathy of both eyes  H35.033     5. 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 Pseudophakia, both eyes  Z96.1      **Pt presents today 06.27.25 for painful OS s/p inj on 06.23.25** - mild irritation and dry eyes - no corneal abrasion or endophthalmitis - recommend artificial tears / lubricating drops as needed  **Pt reports decreased vision OU since having stroke in Sept 2024**  1. Proliferative diabetic retinopathy, both eyes  - IVA OU #1 (05.23.25), #2 (06.23.25)  - last A1c 6.4 on 04.30.25 -- s/p kidney and pancreas transplant - exam shows blood stained vit condensations OU (OD red; OS white); scattered NV/fibrosis OU - FA (05.23.25) shows OD: Large areas of vascular non-perfusion superior, nasal and inferior midzone, leaking NV superior and inferior to disc; OS: Large areas of vascular non-perfusion superior, nasal and inferior midzone, leaking NV greatest nasal to disc and along temporal arcades -- pt will need PRP OU - OCT shows OD: Diffuse IRA, central cystic changes--slightly improved, +vitreous opacities- improved; OS: Diffuse atrophy, prominent posterior hyaloid with interval improvement subhyaloid opacities, focal tractional edema along ST arcades - BCVA OD 20/20 - improved; OS 20/150 decreased from 20/100 - IVA informed consent obtained and signed, 05.23.25 - f/u as scheduled -- DFE/OCT, possible injection  2. Bilateral optic neuropathy  - thought to be secondary to  tacrolimus  use  - previously managed and monitored by Dr. Merita visit 2022  - exam shows 3+ disc pallor OU  - monitor  3,4. Hypertensive retinopathy OU - discussed importance of tight BP control - monitor  5. Ocular Hypertension OU  -IOP 18,20  -begin Brimonidine  BID OU -- possible allergic reaction to brimonidine , will switch to Cosopt -monitor  6. Pseudophakia OU  - s/p CE/IOL  - IOL in good position, doing well  - monitor  7. H/o CVA - Sept .2024  - occurred while receiving belatacept  infusion  - MRI Brain 09.23.24 IMPRESSION: 1. Scattered small foci of acute subcortical infarction in the left occipital lobe and posterior left frontal lobe. 2. Faint diffusion signal abnormality along the left parietal lobe may reflect evolving infarct versus seizure related cytotoxic edema. 3. 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    Ophthalmic Meds Ordered this visit:  Meds ordered this encounter  Medications   dorzolamide-timolol (COSOPT) 2-0.5 % ophthalmic solution    Sig: Place 1 drop into both eyes 2 (two) times daily.    Dispense:  10 mL    Refill:  2     Return for f/u as scheduled.  There are no Patient Instructions on file for this visit.  Explained the diagnoses, plan, and follow up with the patient and they expressed understanding.  Patient expressed understanding of the importance of proper follow up care.   This document serves as a record of services personally performed by Redell JUDITHANN Hans, MD, PhD. It was created on their behalf by Almetta Pesa, an ophthalmic technician. The creation of this record is the provider's dictation and/or activities during the visit.    Electronically signed by: Almetta Pesa, OA, 06/23/24  3:08 PM  This document serves as a record of services personally performed by Redell JUDITHANN Hans, MD, PhD. It was created on their behalf by Alan PARAS. Delores, OA an ophthalmic technician. The creation of this  record is the provider's dictation and/or activities during the visit.    Electronically signed by: Alan PARAS. Delores, OA 06/23/24 3:08 PM  Redell JUDITHANN Hans, M.D., Ph.D. Diseases & Surgery of the Retina and Vitreous [facility] 06/21/2024  I have reviewed the above documentation for accuracy and completeness, and I agree with the above. Redell JUDITHANN Hans, M.D., Ph.D. 06/23/24 3:10 PM   Abbreviations: M myopia (nearsighted); A astigmatism; H hyperopia (farsighted); P presbyopia; Mrx spectacle prescription;  CTL contact lenses; OD right eye; OS left eye; OU both eyes  XT exotropia; ET esotropia; PEK punctate epithelial keratitis; PEE punctate epithelial erosions; DES dry eye syndrome; MGD meibomian gland dysfunction; ATs artificial tears; PFAT's preservative free artificial tears; NSC nuclear sclerotic cataract; PSC posterior subcapsular cataract; ERM epi-retinal membrane; PVD posterior vitreous detachment; RD retinal detachment; DM diabetes mellitus; DR diabetic retinopathy; NPDR non-proliferative diabetic retinopathy; PDR proliferative diabetic retinopathy; CSME clinically significant macular edema; DME diabetic macular edema; dbh dot blot hemorrhages; CWS cotton wool spot; POAG primary open angle glaucoma; C/D cup-to-disc ratio; HVF humphrey visual field; GVF goldmann visual field; OCT optical coherence tomography; IOP intraocular pressure; BRVO Branch retinal vein occlusion; CRVO central retinal vein occlusion; CRAO central retinal artery occlusion; BRAO branch retinal artery occlusion; RT retinal tear; SB scleral buckle; PPV pars plana vitrectomy; VH Vitreous hemorrhage; PRP panretinal laser photocoagulation; IVK intravitreal kenalog; VMT vitreomacular traction; MH Macular hole;  NVD neovascularization of the disc; NVE neovascularization elsewhere; AREDS age related eye disease study; ARMD age related macular degeneration; POAG primary open angle glaucoma; EBMD epithelial/anterior  basement membrane dystrophy; ACIOL anterior chamber intraocular lens; IOL intraocular lens; PCIOL posterior chamber intraocular lens; Phaco/IOL phacoemulsification with intraocular lens placement; PRK photorefractive keratectomy; LASIK laser assisted in situ keratomileusis; HTN hypertension; DM diabetes mellitus; COPD chronic obstructive pulmonary disease

## 2024-06-21 ENCOUNTER — Encounter (INDEPENDENT_AMBULATORY_CARE_PROVIDER_SITE_OTHER): Payer: Self-pay | Admitting: Ophthalmology

## 2024-06-21 ENCOUNTER — Ambulatory Visit (INDEPENDENT_AMBULATORY_CARE_PROVIDER_SITE_OTHER): Admitting: Ophthalmology

## 2024-06-21 DIAGNOSIS — H40053 Ocular hypertension, bilateral: Secondary | ICD-10-CM

## 2024-06-21 DIAGNOSIS — H469 Unspecified optic neuritis: Secondary | ICD-10-CM | POA: Diagnosis not present

## 2024-06-21 DIAGNOSIS — I10 Essential (primary) hypertension: Secondary | ICD-10-CM

## 2024-06-21 DIAGNOSIS — E113513 Type 2 diabetes mellitus with proliferative diabetic retinopathy with macular edema, bilateral: Secondary | ICD-10-CM | POA: Diagnosis not present

## 2024-06-21 DIAGNOSIS — H35033 Hypertensive retinopathy, bilateral: Secondary | ICD-10-CM

## 2024-06-21 DIAGNOSIS — Z961 Presence of intraocular lens: Secondary | ICD-10-CM | POA: Diagnosis not present

## 2024-06-21 MED ORDER — DORZOLAMIDE HCL-TIMOLOL MAL 2-0.5 % OP SOLN: 1.0000 [drp] | Freq: Two times a day (BID) | OPHTHALMIC | 2 refills | Status: DC

## 2024-06-23 ENCOUNTER — Encounter (INDEPENDENT_AMBULATORY_CARE_PROVIDER_SITE_OTHER): Payer: Self-pay | Admitting: Ophthalmology

## 2024-06-24 NOTE — Telephone Encounter (Signed)
 Received Fax from [facility] Pt had Appt 05/02/24 at 2:45 and  was a no show . Pt has not called back to reschedule

## 2024-06-27 ENCOUNTER — Other Ambulatory Visit: Payer: Self-pay

## 2024-06-27 MED ORDER — CYCLOSPORINE MODIFIED 50 MG PO CAPS
50.0000 mg | ORAL_CAPSULE | Freq: Two times a day (BID) | ORAL | 5 refills | Status: DC
  Filled 2024-06-27: qty 60, 30d supply, fill #0
  Filled 2024-07-27: qty 60, 30d supply, fill #1
  Filled 2024-08-26 – 2024-09-02 (×3): qty 60, 30d supply, fill #2
  Filled 2024-09-30: qty 60, 30d supply, fill #3
  Filled 2024-10-27: qty 60, 30d supply, fill #4
  Filled 2024-11-29: qty 60, 30d supply, fill #5

## 2024-07-01 ENCOUNTER — Other Ambulatory Visit: Payer: Self-pay | Admitting: Internal Medicine

## 2024-07-01 ENCOUNTER — Other Ambulatory Visit: Payer: Self-pay

## 2024-07-01 DIAGNOSIS — I151 Hypertension secondary to other renal disorders: Secondary | ICD-10-CM

## 2024-07-01 DIAGNOSIS — I10 Essential (primary) hypertension: Secondary | ICD-10-CM

## 2024-07-02 ENCOUNTER — Encounter: Payer: Self-pay | Admitting: Internal Medicine

## 2024-07-02 DIAGNOSIS — Z8719 Personal history of other diseases of the digestive system: Secondary | ICD-10-CM | POA: Diagnosis not present

## 2024-07-02 DIAGNOSIS — Z79621 Long term (current) use of calcineurin inhibitor: Secondary | ICD-10-CM | POA: Diagnosis not present

## 2024-07-02 DIAGNOSIS — Z940 Kidney transplant status: Secondary | ICD-10-CM | POA: Diagnosis not present

## 2024-07-02 DIAGNOSIS — K3184 Gastroparesis: Secondary | ICD-10-CM | POA: Diagnosis not present

## 2024-07-02 DIAGNOSIS — Z8673 Personal history of transient ischemic attack (TIA), and cerebral infarction without residual deficits: Secondary | ICD-10-CM | POA: Diagnosis not present

## 2024-07-02 DIAGNOSIS — I10 Essential (primary) hypertension: Secondary | ICD-10-CM | POA: Diagnosis not present

## 2024-07-02 DIAGNOSIS — S88111S Complete traumatic amputation at level between knee and ankle, right lower leg, sequela: Secondary | ICD-10-CM | POA: Diagnosis not present

## 2024-07-02 DIAGNOSIS — Z978 Presence of other specified devices: Secondary | ICD-10-CM | POA: Diagnosis not present

## 2024-07-02 DIAGNOSIS — Z89512 Acquired absence of left leg below knee: Secondary | ICD-10-CM | POA: Diagnosis not present

## 2024-07-02 DIAGNOSIS — S88112S Complete traumatic amputation at level between knee and ankle, left lower leg, sequela: Secondary | ICD-10-CM | POA: Diagnosis not present

## 2024-07-02 DIAGNOSIS — Z48288 Encounter for aftercare following multiple organ transplant: Secondary | ICD-10-CM | POA: Diagnosis not present

## 2024-07-02 DIAGNOSIS — Z89511 Acquired absence of right leg below knee: Secondary | ICD-10-CM | POA: Diagnosis not present

## 2024-07-02 DIAGNOSIS — Z79899 Other long term (current) drug therapy: Secondary | ICD-10-CM | POA: Diagnosis not present

## 2024-07-02 DIAGNOSIS — K219 Gastro-esophageal reflux disease without esophagitis: Secondary | ICD-10-CM | POA: Diagnosis not present

## 2024-07-02 DIAGNOSIS — Z0000 Encounter for general adult medical examination without abnormal findings: Secondary | ICD-10-CM | POA: Diagnosis not present

## 2024-07-02 DIAGNOSIS — Z9483 Pancreas transplant status: Secondary | ICD-10-CM | POA: Diagnosis not present

## 2024-07-02 DIAGNOSIS — D849 Immunodeficiency, unspecified: Secondary | ICD-10-CM | POA: Diagnosis not present

## 2024-07-02 DIAGNOSIS — Z87440 Personal history of urinary (tract) infections: Secondary | ICD-10-CM | POA: Diagnosis not present

## 2024-07-02 DIAGNOSIS — E785 Hyperlipidemia, unspecified: Secondary | ICD-10-CM | POA: Diagnosis not present

## 2024-07-04 NOTE — Progress Notes (Signed)
 [facility] - Clinic Note  07/15/2024   CHIEF COMPLAINT Patient presents for Retina Follow Up  HISTORY OF PRESENT ILLNESS: [name] is a 48 y.o. male who presents to the clinic today for:  HPI     Retina Follow Up   Patient presents with  Diabetic Retinopathy.  In both eyes.  This started 4 weeks ago.  I, the attending physician,  performed the HPI with the patient and updated documentation appropriately.        Comments   Patient here for 4 weeks retina follow up for PDR OU. Patient states vision doing better. No eye pain.      Last edited by [name], MD on 07/15/2024  5:20 PM.    Pt states VA is stable  Referring physician: [name], MD [address] [place],  [place] [address]  HISTORICAL INFORMATION:  Selected notes from the MEDICAL RECORD NUMBER Referred by Dr. Hamilton Octavia for PDR OU LEE:  Ocular Hx- previously followed with Dr. 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   dorzolamide -timolol  (COSOPT ) 2-0.5 % ophthalmic solution Place 1 drop into both eyes 2 (two) times daily.   No current facility-administered medications for this visit. (Ophthalmic Drugs)   Current Outpatient Medications (Other)  Medication Sig   acetaminophen  (TYLENOL ) 325 MG tablet Take 1-2 tablets (325-650 mg total) by mouth every 4 (four) hours as needed for mild pain (pain score 1-3).   aspirin  EC 81 MG tablet Take 1 tablet (81 mg total) by mouth daily. Swallow whole.   atorvastatin  (LIPITOR) 40 MG tablet Take 1 tablet (40 mg total) by mouth daily.   carvedilol  (COREG ) 12.5 MG tablet Take 1 tablet (12.5 mg total) by  mouth 2 (two) times daily with a meal.   cycloSPORINE  modified (NEORAL ) 100 MG capsule Take 100 mg by mouth 2 (two) times daily. Taken with 50 mg tablet   cycloSPORINE  modified (NEORAL ) 50 MG capsule Take 1 capsule by mouth two times daily, Take along with 100 mg twice a day   hydrALAZINE  (APRESOLINE ) 50 MG tablet TAKE 1 TABLET(50 MG) BY MOUTH THREE TIMES DAILY   mycophenolate  (MYFORTIC ) 180 MG EC tablet TAKE 1 TABLET BY MOUTH TWICE DAILY   naloxone  (NARCAN ) nasal spray 4 mg/0.1 mL Inhale 1 (one) Spray if poorly responding or lips turning blue   naloxone  (NARCAN ) nasal spray 4 mg/0.1 mL Place 1 spray if poorly responding or lips turning blue   nystatin  (MYCOSTATIN ) 100000 UNIT/ML suspension Swish and swallow 5 mls four times daily for 14 days   oxyCODONE  (ROXICODONE ) 15 MG immediate release tablet Take 1 tablet (15 mg total) by mouth in the morning, at noon, in the evening, and at bedtime if needed for pain   oxyCODONE  (ROXICODONE ) 15 MG immediate release tablet Take 1 tablet (15 mg total) by mouth 4 (four) times daily as needed for pain.   Oxycodone  HCl 10 MG TABS Take 0.5-1 tablets (5-10 mg total) by mouth 5 (five) times daily as needed,   pantoprazole  (PROTONIX ) 40 MG tablet TAKE 1 TABLET(40 MG) BY MOUTH TWICE DAILY   predniSONE  (DELTASONE ) 5 MG tablet Take 5 mg  by mouth daily.   sulfamethoxazole -trimethoprim  (BACTRIM ) 400-80 MG tablet Take 1 tablet by mouth every Monday, Wednesday, and Friday.   terazosin  (HYTRIN ) 2 MG capsule Take 2 capsules (4 mg total) by mouth at bedtime.   fluconazole  (DIFLUCAN ) 200 MG tablet Take 1 tablet (200 mg total) by mouth daily. (Patient not taking: Reported on 04/23/2024)   No current facility-administered medications for this visit. (Other)   REVIEW OF SYSTEMS: ROS   Positive for: Musculoskeletal, Eyes Last edited by Orval Asberry RAMAN, COA on 07/15/2024  3:01 PM.      ALLERGIES Allergies  Allergen Reactions   Belatacept  Anaphylaxis    hypotensive with  altered reaction and required epinephrine .  Needed to be intubated for airway protection.   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   Diabetes mellitus without complication [facility])    no since pancreas transplant   Dialysis patient [facility]) 04/18/2012   [facility]; [place], [place], Sat   Gastroparesis    Gastropathy    GERD (gastroesophageal reflux disease)    Hypertension    MRSA infection    over 10 years ago per patient. in legs   Past Surgical History:  Procedure Laterality Date   AV FISTULA PLACEMENT  08/2011   left upper arm   BELOW KNEE LEG AMPUTATION  it's been awhile   bilaterally   CATARACT EXTRACTION  ~ 2011   right   COMBINED KIDNEY-PANCREAS TRANSPLANT  2014   ESOPHAGOGASTRODUODENOSCOPY N/A 12/30/2016   Procedure: ESOPHAGOGASTRODUODENOSCOPY (EGD);  Surgeon: Belvie Just, MD;  Location: [facility] ENDOSCOPY;  Service: Endoscopy;  Laterality: N/A;   OLECRANON BURSECTOMY Right 06/23/2020   Procedure: RIGHT ELBOW EXCISION OLECRANON BURSITIS;  Surgeon: Harden Jerona GAILS, MD;  Location: [facility];  Service: Orthopedics;  Laterality: Right;   FAMILY HISTORY Family History  Problem Relation Age of Onset   Hypertension Mother    Diabetes Mother    Kidney disease Mother    Diabetes Maternal Grandmother    Diabetes Paternal Grandmother    Diabetes Other    Hypertension Other    Lung cancer Maternal Aunt    Colon cancer Neg Hx    Esophageal cancer Neg Hx    Rectal cancer Neg Hx    Stomach cancer Neg Hx    SOCIAL HISTORY Social History   Tobacco Use   Smoking status: Former    Types: Cigars    Quit date: 07/06/2022    Years since quitting: 2.0   Smokeless tobacco: Never    Tobacco comments:    black and mild  Vaping Use   Vaping status: Never Used  Substance Use Topics   Alcohol use: No   Drug use: Not Currently    Frequency: 3.0 times per week    Types: Marijuana    Comment: Occasionally       OPHTHALMIC EXAM:  Base Eye Exam     Visual Acuity (Snellen - Linear)       Right Left   Dist [place] 20/25 +1 20/150   Dist ph [place] 20/20 -2 20/80         Tonometry (Tonopen, 2:59 PM)  Right Left   Pressure 17 38, 32         Tonometry #2 (Tonopen, 4:30 PM)       Right Left   Pressure  24,25         Tonometry Comments   Brimonidine  and cosopt  given at 2;58 pm        Pupils       Dark Light Shape React APD   Right 2 1 Round Minimal None   Left 2 1 Round Minimal None         Visual Fields (Counting fingers)       Left Right    Full Full         Extraocular Movement       Right Left    Full, Ortho Full, Ortho         Neuro/Psych     Oriented x3: Yes   Mood/Affect: Normal         Dilation     Both eyes: 1.0% Mydriacyl @ 2:58 PM           Slit Lamp and Fundus Exam     Slit Lamp Exam       Right Left   Lids/Lashes Dermatochalasis - upper lid Dermatochalasis - upper lid   Conjunctiva/Sclera Melanosis Melanosis   Cornea well healed cataract wound, trace PEE, trace tear film debris well healed cataract wound, trace PEE, tear film debris   Anterior Chamber deep and clear deep and clear   Iris Round and moderately dilated, No NVI Round and moderately dilated, No NVI   Lens PC IOL in good position with open PC PC IOL in good position with open PC   Anterior Vitreous White blood stained vit condensations settling inferiorly. Old white blood stained vitreous condensations-improving         Fundus Exam       Right Left   Disc 3+Pallor, Sharp rim, +fibrosis 3+Pallor, Sharp rim, +fibrosis, vascular loops / NVD-regressing   C/D Ratio 0.3 0.3   Macula Flat, Blunted foveal reflex, scattered MA / DBH, central  cystic changes slightly increased, +atrophy Flat, good foveal reflex, scattered fibrosis, scattered MA/DBH   Vessels attenuated, Tortuous, +NV severe attenuation, Tortuous, +NV   Periphery Attached, scattered MA / DBH Attached, scattered MA, diffuse atrophy           Refraction     Wearing Rx       Sphere Cylinder Axis Add   Right -2.25 +1.25 127 +1.25   Left -1.50 +1.00 045 +1.25           IMAGING AND PROCEDURES  Imaging and Procedures for 07/15/2024  OCT, Retina - OU - Both Eyes       Right Eye Quality was good. Central Foveal Thickness: 290. Progression has worsened. Findings include no SRF, abnormal foveal contour, intraretinal fluid, inner retinal atrophy, vitreomacular adhesion (Diffuse IRA, central cystic changes--slightly increased, +vitreous opacities- stably improved).   Left Eye Quality was good. Central Foveal Thickness: 250. Progression has been stable. Findings include no SRF, abnormal foveal contour, epiretinal membrane, intraretinal fluid, vitreous traction, preretinal fibrosis (Diffuse atrophy, prominent posterior hyaloid with mild interval improvement in subhyaloid opacities, focal tractional edema along ST arcades).   Notes *Images captured and stored on drive  Diagnosis / Impression:  OD: Diffuse IRA, central cystic changes--slightly increased, +vitreous opacities- stably improved OS: Diffuse atrophy, prominent posterior hyaloid with mild interval improvement in subhyaloid opacities, focal tractional edema along ST arcades  Clinical management:  See below  Abbreviations: NFP - Normal foveal profile. CME - cystoid macular edema. PED - pigment epithelial detachment. IRF - intraretinal fluid. SRF - subretinal fluid. EZ - ellipsoid zone. ERM - epiretinal membrane. ORA - outer retinal atrophy. ORT - outer retinal tubulation. SRHM - subretinal hyper-reflective material. IRHM - intraretinal hyper-reflective material      Intravitreal Injection,  Pharmacologic Agent - OD - Right Eye       Time Out 07/15/2024. 4:09 PM. Confirmed correct patient, procedure, site, and patient consented.   Anesthesia Topical anesthesia was used. Anesthetic medications included Lidocaine  2%, Proparacaine 0.5%.   Procedure Preparation included 5% betadine to ocular surface, eyelid speculum. A supplied (32g) needle was used.   Injection: 1.25 mg Bevacizumab  1.25mg /0.27ml   Route: Intravitreal, Site: Right Eye   NDC: H525437, Lot: [id number], Expiration date: 08/12/2024   Post-op Post injection exam found visual acuity of at least counting fingers. The patient tolerated the procedure well. There were no complications. The patient received written and verbal post procedure care education. Post injection medications were not given.      Intravitreal Injection, Pharmacologic Agent - OS - Left Eye       Time Out 07/15/2024. 4:10 PM. Confirmed correct patient, procedure, site, and patient consented.   Anesthesia Topical anesthesia was used. Anesthetic medications included Lidocaine  2%, Proparacaine 0.5%.   Procedure Preparation included 5% betadine to ocular surface, eyelid speculum. A (32g) needle was used.   Injection: 1.25 mg Bevacizumab  1.25mg /0.22ml   Route: Intravitreal, Site: Left Eye   NDC: H525437, Lot: [id number], Expiration date: 09/23/2024   Post-op Post injection exam found visual acuity of at least counting fingers. The patient tolerated the procedure well. There were no complications. The patient received written and verbal post procedure care education. Post injection medications were not given.           ASSESSMENT/PLAN:   ICD-10-CM   1. Proliferative diabetic retinopathy of both eyes with macular edema associated with type 2 diabetes mellitus (HCC)  E11.3513 OCT, Retina - OU - Both Eyes    Intravitreal Injection, Pharmacologic Agent - OD - Right Eye    Intravitreal Injection, Pharmacologic Agent - OS - Left Eye     Bevacizumab  (AVASTIN ) SOLN 1.25 mg    Bevacizumab  (AVASTIN ) SOLN 1.25 mg    2. Optic neuropathy  H46.9     3. Essential hypertension  I10     4. Hypertensive retinopathy of both eyes  H35.033     5. 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 Pseudophakia, both eyes  Z96.1     7. History of stroke  Z86.73      **Pt reports decreased vision OU since having stroke in Sept 2024**  1. Proliferative diabetic retinopathy, both eyes  - IVA OU #1 (05.23.25), #2 (06.23.25)  - last A1c 6.4 on 04.30.25 -- s/p kidney and pancreas transplant - exam shows blood stained vit condensations OU (OD red; OS white); scattered NV/fibrosis OU - FA (05.23.25) shows OD: Large areas of vascular non-perfusion superior, nasal and inferior midzone, leaking NV superior and inferior to disc; OS: Large areas of vascular non-perfusion superior, nasal and inferior midzone, leaking NV greatest nasal to disc and along temporal arcades -- pt will need PRP OU - OCT shows OD: Diffuse IRA, central cystic changes--slightly increased, +vitreous opacities- improved; OS: Diffuse atrophy, prominent posterior hyaloid with interval improvement in subhyaloid opacities, focal tractional edema along ST arcades - BCVA OD 20/20 - stable; OS 20/80  from 20/150 - recommend IVA OU #3 today, 07.21.25 - RBA of procedure discussed, questions answered - IVA informed consent obtained and signed, 05.23.25 - see procedure note  - f/u 7.29.25 @ 245 for PRP OS?-- DFE/OCT,   2. Bilateral optic neuropathy  - thought to be secondary to tacrolimus  use  - previously managed and monitored by Dr. Merita visit 2022  - exam shows 3+ disc pallor OU  - monitor  3,4. Hypertensive retinopathy OU - discussed importance of tight BP control - monitor  5. Ocular Hypertension OU  - IOP 17, 32  - h/o possible adverse rxn to Brimonidine   - switched to Cosopt  BID OU - monitor  6. Pseudophakia OU  - s/p CE/IOL  - IOL in good position, doing  well  - monitor  7. H/o CVA - Sept .2024  - occurred while receiving belatacept  infusion  - MRI Brain 09.23.24 IMPRESSION: 1. Scattered small foci of acute subcortical infarction in the left occipital lobe and posterior left frontal lobe. 2. Faint diffusion signal abnormality along the left parietal lobe may reflect evolving infarct versus seizure related cytotoxic edema. 3. 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  Ophthalmic Meds Ordered this visit:  Meds ordered this encounter  Medications   Bevacizumab  (AVASTIN ) SOLN 1.25 mg   Bevacizumab  (AVASTIN ) SOLN 1.25 mg     Return for 7.29.25 @ 245pm for PRP OS? PDR OU, DFE, OCT.  There are no Patient Instructions on file for this visit.  Explained the diagnoses, plan, and follow up with the patient and they expressed understanding.  Patient expressed understanding of the importance of proper follow up care.   This document serves as a record of services personally performed by Redell JUDITHANN Hans, MD, PhD. It was created on their behalf by Avelina Pereyra, COA an ophthalmic technician. The creation of this record is the provider's dictation and/or activities during the visit.   Electronically signed by: Avelina GORMAN Pereyra, COT  07/15/24  9:38 PM   This document serves as a record of services personally performed by Redell JUDITHANN Hans, MD, PhD. It was created on their behalf by Almetta Pesa, an ophthalmic technician. The creation of this record is the provider's dictation and/or activities during the visit.    Electronically signed by: Almetta Pesa, OA, 07/15/24  9:38 PM  Redell JUDITHANN Hans, M.D., Ph.D. Diseases & Surgery of the Retina and Vitreous [facility] 07/15/2024  I have reviewed the above documentation for accuracy and completeness, and I agree with the above. Redell JUDITHANN Hans, M.D., Ph.D. 07/15/24 9:44 PM   Abbreviations: M myopia (nearsighted); A astigmatism; H hyperopia  (farsighted); P presbyopia; Mrx spectacle prescription;  CTL contact lenses; OD right eye; OS left eye; OU both eyes  XT exotropia; ET esotropia; PEK punctate epithelial keratitis; PEE punctate epithelial erosions; DES dry eye syndrome; MGD meibomian gland dysfunction; ATs artificial tears; PFAT's preservative free artificial tears; NSC nuclear sclerotic cataract; PSC posterior subcapsular cataract; ERM epi-retinal membrane; PVD posterior vitreous detachment; RD retinal detachment; DM diabetes mellitus; DR diabetic retinopathy; NPDR non-proliferative diabetic retinopathy; PDR proliferative diabetic retinopathy; CSME clinically significant macular edema; DME diabetic macular edema; dbh dot blot hemorrhages; CWS cotton wool spot; POAG primary open angle glaucoma; C/D cup-to-disc ratio; HVF humphrey visual field; GVF goldmann visual field; OCT optical coherence tomography; IOP intraocular pressure; BRVO Branch retinal vein occlusion; CRVO central retinal vein occlusion; CRAO central retinal artery occlusion; BRAO branch retinal artery occlusion; RT retinal tear;  SB scleral buckle; PPV pars plana vitrectomy; VH Vitreous hemorrhage; PRP panretinal laser photocoagulation; IVK intravitreal kenalog; VMT vitreomacular traction; MH Macular hole;  NVD neovascularization of the disc; NVE neovascularization elsewhere; AREDS age related eye disease study; ARMD age related macular degeneration; POAG primary open angle glaucoma; EBMD epithelial/anterior basement membrane dystrophy; ACIOL anterior chamber intraocular lens; IOL intraocular lens; PCIOL posterior chamber intraocular lens; Phaco/IOL phacoemulsification with intraocular lens placement; PRK photorefractive keratectomy; LASIK laser assisted in situ keratomileusis; HTN hypertension; DM diabetes mellitus; COPD chronic obstructive pulmonary disease

## 2024-07-10 ENCOUNTER — Encounter (INDEPENDENT_AMBULATORY_CARE_PROVIDER_SITE_OTHER): Payer: Self-pay | Admitting: Otolaryngology

## 2024-07-10 ENCOUNTER — Ambulatory Visit (INDEPENDENT_AMBULATORY_CARE_PROVIDER_SITE_OTHER): Payer: 59 | Admitting: Otolaryngology

## 2024-07-10 VITALS — BP 178/75 | HR 67

## 2024-07-10 DIAGNOSIS — H6123 Impacted cerumen, bilateral: Secondary | ICD-10-CM

## 2024-07-10 DIAGNOSIS — H938X3 Other specified disorders of ear, bilateral: Secondary | ICD-10-CM

## 2024-07-10 DIAGNOSIS — H9190 Unspecified hearing loss, unspecified ear: Secondary | ICD-10-CM

## 2024-07-10 DIAGNOSIS — R221 Localized swelling, mass and lump, neck: Secondary | ICD-10-CM

## 2024-07-10 NOTE — Progress Notes (Addendum)
 Dear Dr. Joshua, Here is my assessment for our mutual patient, [name]. Thank you for allowing me the opportunity to care for your patient. Please do not hesitate to contact me should you have any other questions. Sincerely, Dr. Eldora Blanch  Otolaryngology Clinic Note Referring provider: Dr. Joshua HPI:  [name] is a 48 y.o. male kindly referred by Dr. Joshua for evaluation of hearing loss and cerumen impaction and ear issues.  Initial visit: Patient reports: bilateral ear fullness, intermittent hearing loss; noted to have issues with wax, intermittently cleaned. Otherwise no issues with ears. Has tried mineral oil Patient currently denies: ear pain, fullness, vertigo, drainage, tinnitus Patient additionally denies: deep pain in ear canal, eustachian tube symptoms such as popping, crackling, sensitive to pressure changes Patient also denies barotrauma, vestibular suppressant use, ototoxic medication use No prior audio  --------------------------------------------------------- 07/10/2024 Returns for follow up. Continued mild ear fullness which has recurred. He does not think his hearing is an issue. Otherwise no vertigo, pain, drainage, infections or tinnitus. We again discussed audiogram but he declined He did have an MRI on 03/25/2024   H&N Surgery: no Personal or FHx of bleeding dz or anesthesia difficulty: no  PMHx: PAD, HTN, Strokes, s/p Kidney and Pancreas Transplant, Carotid Stenosis, T1DM, ESRD  Independent Review of Additional Tests or Records:  Dr. Joshua (Acute Care) 10/25/2023 referral notes - noted hearing loss, b/l cerumen impaction; Dx: cerumen impaction; Rx: ref ENT Labs 12/12/2023 CMP and CBC: Cr 2.07, GFR 39; WBV 4.6, Hgb 14.4, Plt 202 CT Head 10/11/2023 independently interpreted with attention to ears: mastoids and ME well aerated; b/l cerumen impaction; otic capsule and ossicles unremarkable though cuts thick MRI Brain 10/08/2023 independently  interpreted and reviewed - no retrocochlear lesion or mastoid effusion MRI Brain 03/25/2024 indepedently interpreted with respect to ears: - no mastoid effusion noted with exception of possible right mastoid tip effusion, no retrocochlear lesions noted; study suboptimal since no IAC dedicated cuts noted PMH/Meds/All/SocHx/FamHx/ROS:   Past Medical History:  Diagnosis Date   AMPUTATION, BELOW KNEE, 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   Diabetes mellitus without complication [facility])    no since pancreas transplant   Dialysis patient [facility]) 04/18/2012   [facility]; [place], [place], Sat   Gastroparesis    Gastropathy    GERD (gastroesophageal reflux disease)    Hypertension    MRSA infection    over 10 years ago per patient. in legs     Past Surgical History:  Procedure Laterality Date   AV FISTULA PLACEMENT  08/2011   left upper arm   BELOW KNEE LEG AMPUTATION  it's been awhile   bilaterally   CATARACT EXTRACTION  ~ 2011   right   COMBINED KIDNEY-PANCREAS TRANSPLANT  2014   ESOPHAGOGASTRODUODENOSCOPY N/A 12/30/2016   Procedure: ESOPHAGOGASTRODUODENOSCOPY (EGD);  Surgeon: Belvie Just, MD;  Location: [facility] ENDOSCOPY;  Service: Endoscopy;  Laterality: N/A;   OLECRANON BURSECTOMY Right 06/23/2020   Procedure: RIGHT ELBOW EXCISION OLECRANON BURSITIS;  Surgeon: Harden Jerona GAILS, MD;  Location: [facility];  Service: Orthopedics;  Laterality: Right;    Family History  Problem Relation Age of Onset   Hypertension Mother    Diabetes Mother    Kidney disease Mother    Diabetes  Maternal Grandmother    Diabetes Paternal Grandmother    Diabetes Other    Hypertension Other    Lung cancer Maternal Aunt    Colon cancer Neg Hx    Esophageal cancer Neg Hx    Rectal cancer Neg Hx    Stomach cancer Neg Hx       Social Connections: Socially Integrated (09/11/2023)   Social Connection and Isolation Panel    Frequency of Communication with Friends and Family: More than three times a week    Frequency of Social Gatherings with Friends and Family: More than three times a week    Attends Religious Services: More than 4 times per year    Active Member of [employer] or Organizations: Yes    Attends Engineer, structural: More than 4 times per year    Marital Status: Married      Current Outpatient Medications:    acetaminophen  (TYLENOL ) 325 MG tablet, Take 1-2 tablets (325-650 mg total) by mouth every 4 (four) hours as needed for mild pain (pain score 1-3)., Disp: , Rfl:    aspirin  EC 81 MG tablet, Take 1 tablet (81 mg total) by mouth daily. Swallow whole., Disp: , Rfl:    atorvastatin  (LIPITOR) 40 MG tablet, Take 1 tablet (40 mg total) by mouth daily., Disp: 90 tablet, Rfl: 1   brimonidine  (ALPHAGAN ) 0.2 % ophthalmic solution, Place 1 drop into both eyes in the morning and at bedtime., Disp: 5 mL, Rfl: 3   carvedilol  (COREG ) 12.5 MG tablet, Take 1 tablet (12.5 mg total) by mouth 2 (two) times daily with a meal., Disp: 180 tablet, Rfl: 1   cycloSPORINE  modified (NEORAL ) 100 MG capsule, Take 100 mg by mouth 2 (two) times daily. Taken with 50 mg tablet, Disp: , Rfl:    cycloSPORINE  modified (NEORAL ) 50 MG capsule, Take 1 capsule by mouth two times daily, Take along with 100 mg twice a day, Disp: 60 capsule, Rfl: 5   dorzolamide -timolol  (COSOPT ) 2-0.5 % ophthalmic solution, Place 1 drop into both eyes 2 (two) times daily., Disp: 10 mL, Rfl: 2   hydrALAZINE  (APRESOLINE ) 50 MG tablet, TAKE 1 TABLET(50 MG) BY MOUTH THREE TIMES DAILY, Disp: 270 tablet, Rfl: 0   mycophenolate  (MYFORTIC ) 180 MG EC tablet, TAKE 1 TABLET BY MOUTH TWICE DAILY, Disp: 180 tablet, Rfl: 0   naloxone  (NARCAN ) nasal spray 4 mg/0.1 mL, Inhale 1 (one) Spray if poorly responding or lips turning blue, Disp: 2 each, Rfl: 2   naloxone   (NARCAN ) nasal spray 4 mg/0.1 mL, Place 1 spray if poorly responding or lips turning blue, Disp: 2 each, Rfl: 2   nystatin  (MYCOSTATIN ) 100000 UNIT/ML suspension, Swish and swallow 5 mls four times daily for 14 days, Disp: 280 mL, Rfl: 0   oxyCODONE  (ROXICODONE ) 15 MG immediate release tablet, Take 1 tablet (15 mg total) by mouth in the morning, at noon, in the evening, and at bedtime if needed for pain, Disp: 120 tablet, Rfl: 0   oxyCODONE  (ROXICODONE ) 15 MG immediate release tablet, Take 1 tablet (15 mg total) by mouth 4 (four) times daily as needed for pain., Disp: 120 tablet, Rfl: 0   Oxycodone  HCl 10 MG TABS, Take 0.5-1 tablets (5-10 mg total) by mouth 5 (five) times daily as needed,, Disp: 150 tablet, Rfl: 0   pantoprazole  (PROTONIX ) 40 MG tablet, TAKE 1 TABLET(40 MG) BY MOUTH TWICE DAILY, Disp: 180 tablet, Rfl: 0   predniSONE  (DELTASONE ) 5 MG tablet, Take 5 mg by mouth daily., Disp: ,  Rfl:    sulfamethoxazole -trimethoprim  (BACTRIM ) 400-80 MG tablet, Take 1 tablet by mouth every Monday, Wednesday, and Friday., Disp: 12 tablet, Rfl: 0   terazosin  (HYTRIN ) 2 MG capsule, Take 2 capsules (4 mg total) by mouth at bedtime., Disp: 180 capsule, Rfl: 0   fluconazole  (DIFLUCAN ) 200 MG tablet, Take 1 tablet (200 mg total) by mouth daily. (Patient not taking: Reported on 04/23/2024), Disp: 14 tablet, Rfl: 0   Physical Exam:   BP (!) 178/75 (BP Location: Right Arm, Patient Position: Sitting, Cuff Size: Normal)   Pulse 67   SpO2 96%   Salient findings:  Facial nerve function intact Given history and complaints, ear microscopy was indicated and performed for evaluation with findings as below in physical exam section and in procedures; bilateral cerumen impaction which was cleared (see below), after clearance, TM intact, ME well aerated Weber 512: mid Rinne 512: AC > BC b/l  No stridor  Seprately Identifiable Procedures:  Procedure: Bilateral ear microscopy and cerumen removal using microscope (CPT  [id number]) - Mod 25 Pre-procedure diagnosis: Cerumen impaction bilateral external ears Post-procedure diagnosis: same Indication: bilateral cerumen impaction; given patient's otologic complaints and history as well as for improved and comprehensive examination of external ear and tympanic membrane, bilateral otologic examination using microscope was performed and impacted cerumen removed  Procedure: Patient was placed semi-recumbent. Both ear canals were examined using the microscope with findings above. Impacted Cerumen removed on left and on right using suction and currette with improvement in EAC examination and patency. See findings above afterwards Patient tolerated the procedure well.      Impression & Plans:  [name] is a 47 y.o. male with:  1. Bilateral impacted cerumen   2. Subjective hearing loss    Recurrent cerumen impaction; recent MRI again reassuring; we discussed hearing test again to establish baseline but he wished to defer.  D/w pt aud, declined Recommend baby oil to ears 4 drops once weekly He'd like to been in f/u so will follow up in 6 months with [facility]  Addendum: noted small lymph node likely on carotid US  in 2024, will get repeat neck US  to evaluate given kidney function. Will f/u by phone in 8 weeks for this.  See below regarding exact medications prescribed this encounter including dosages and route: No orders of the defined types were placed in this encounter.     Thank you for allowing me the opportunity to care for your patient. Please do not hesitate to contact me should you have any other questions.  Sincerely, Eldora Blanch, MD Otolaryngologist (ENT), [facility] Phone: [phone] Fax: [phone]  07/10/2024, 8:39 AM   I have personally spent 21 minutes involved in face-to-face and non-face-to-face activities for this patient on the day of the visit.  Professional time spent excludes any procedures performed but includes  the following activities, in addition to those noted in the documentation: preparing to see the patient (review of outside documentation and results), performing a medically appropriate examination, counseling, documenting in the electronic health record, independently interpreting results (MRI)

## 2024-07-10 NOTE — Addendum Note (Signed)
 Addended by: Vaneta Hammontree on: 07/10/2024 09:09 AM   Modules accepted: Orders

## 2024-07-15 ENCOUNTER — Encounter (INDEPENDENT_AMBULATORY_CARE_PROVIDER_SITE_OTHER): Payer: Self-pay | Admitting: Ophthalmology

## 2024-07-15 ENCOUNTER — Ambulatory Visit (INDEPENDENT_AMBULATORY_CARE_PROVIDER_SITE_OTHER): Admitting: Ophthalmology

## 2024-07-15 DIAGNOSIS — H40053 Ocular hypertension, bilateral: Secondary | ICD-10-CM | POA: Diagnosis not present

## 2024-07-15 DIAGNOSIS — Z961 Presence of intraocular lens: Secondary | ICD-10-CM

## 2024-07-15 DIAGNOSIS — E113513 Type 2 diabetes mellitus with proliferative diabetic retinopathy with macular edema, bilateral: Secondary | ICD-10-CM | POA: Diagnosis not present

## 2024-07-15 DIAGNOSIS — H469 Unspecified optic neuritis: Secondary | ICD-10-CM

## 2024-07-15 DIAGNOSIS — H35033 Hypertensive retinopathy, bilateral: Secondary | ICD-10-CM | POA: Diagnosis not present

## 2024-07-15 DIAGNOSIS — I10 Essential (primary) hypertension: Secondary | ICD-10-CM

## 2024-07-15 DIAGNOSIS — Z8673 Personal history of transient ischemic attack (TIA), and cerebral infarction without residual deficits: Secondary | ICD-10-CM

## 2024-07-15 MED ORDER — BEVACIZUMAB CHEMO INJECTION 1.25MG/0.05ML SYRINGE FOR KALEIDOSCOPE
1.2500 mg | INTRAVITREAL | Status: AC | PRN
  Administered 2024-07-15: 1.25 mg via INTRAVITREAL

## 2024-07-18 ENCOUNTER — Other Ambulatory Visit: Payer: Self-pay

## 2024-07-18 DIAGNOSIS — S88119A Complete traumatic amputation at level between knee and ankle, unspecified lower leg, initial encounter: Secondary | ICD-10-CM | POA: Diagnosis not present

## 2024-07-18 DIAGNOSIS — G890 Central pain syndrome: Secondary | ICD-10-CM | POA: Diagnosis not present

## 2024-07-18 DIAGNOSIS — G8191 Hemiplegia, unspecified affecting right dominant side: Secondary | ICD-10-CM | POA: Diagnosis not present

## 2024-07-18 DIAGNOSIS — G894 Chronic pain syndrome: Secondary | ICD-10-CM | POA: Diagnosis not present

## 2024-07-18 DIAGNOSIS — N1831 Chronic kidney disease, stage 3a: Secondary | ICD-10-CM | POA: Diagnosis not present

## 2024-07-18 DIAGNOSIS — M549 Dorsalgia, unspecified: Secondary | ICD-10-CM | POA: Diagnosis not present

## 2024-07-18 DIAGNOSIS — G546 Phantom limb syndrome with pain: Secondary | ICD-10-CM | POA: Diagnosis not present

## 2024-07-18 DIAGNOSIS — Z9483 Pancreas transplant status: Secondary | ICD-10-CM | POA: Diagnosis not present

## 2024-07-18 MED ORDER — OXYCODONE HCL 15 MG PO TABS
15.0000 mg | ORAL_TABLET | Freq: Four times a day (QID) | ORAL | 0 refills | Status: DC | PRN
  Filled 2024-07-19: qty 120, 30d supply, fill #0

## 2024-07-19 ENCOUNTER — Other Ambulatory Visit: Payer: Self-pay

## 2024-07-22 NOTE — Progress Notes (Signed)
 [facility] - Clinic Note  07/23/2024   CHIEF COMPLAINT Patient presents for Retina Follow Up  HISTORY OF PRESENT ILLNESS: [name] is a 48 y.o. male who presents to the clinic today for:  HPI     Retina Follow Up   Patient presents with  Diabetic Retinopathy.  In both eyes.  This started 2 months ago.  Duration of 8 days.  I, the attending physician,  performed the HPI with the patient and updated documentation appropriately.        Comments   Pt states no changes in vision. Pt denies FOL/floaters/pain. Pt does not use ats. Pt using Cosopt  BID OU.      Last edited by [name], MD on 07/23/2024  5:20 PM.     Pt states VA is stable  Referring physician: [name], MD [address] [place],  [place] [address]  HISTORICAL INFORMATION:  Selected notes from the MEDICAL RECORD NUMBER Referred by Dr. Hamilton Octavia for PDR OU LEE:  Ocular Hx- previously followed with Dr. 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   brimonidine  (ALPHAGAN ) 0.2 % ophthalmic solution Place 1 drop into both eyes in the morning and at bedtime.   dorzolamide -timolol  (COSOPT ) 2-0.5 % ophthalmic solution Place 1 drop into both eyes 2 (two) times daily.   No current facility-administered medications for this visit. (Ophthalmic Drugs)   Current Outpatient Medications (Other)  Medication Sig   acetaminophen  (TYLENOL ) 325 MG tablet Take 1-2 tablets (325-650 mg total) by mouth every 4 (four) hours as needed for mild pain (pain score 1-3).   aspirin  EC 81 MG tablet Take 1 tablet (81 mg total) by mouth daily. Swallow whole.    atorvastatin  (LIPITOR) 40 MG tablet Take 1 tablet (40 mg total) by mouth daily.   carvedilol  (COREG ) 12.5 MG tablet Take 1 tablet (12.5 mg total) by mouth 2 (two) times daily with a meal.   cycloSPORINE  modified (NEORAL ) 100 MG capsule Take 100 mg by mouth 2 (two) times daily. Taken with 50 mg tablet   cycloSPORINE  modified (NEORAL ) 50 MG capsule Take 1 capsule by mouth two times daily, Take along with 100 mg twice a day   fluconazole  (DIFLUCAN ) 200 MG tablet Take 1 tablet (200 mg total) by mouth daily. (Patient not taking: Reported on 04/23/2024)   hydrALAZINE  (APRESOLINE ) 50 MG tablet TAKE 1 TABLET(50 MG) BY MOUTH THREE TIMES DAILY   mycophenolate  (MYFORTIC ) 180 MG EC tablet TAKE 1 TABLET BY MOUTH TWICE DAILY   naloxone  (NARCAN ) nasal spray 4 mg/0.1 mL Inhale 1 (one) Spray if poorly responding or lips turning blue   naloxone  (NARCAN ) nasal spray 4 mg/0.1 mL Place 1 spray if poorly responding or lips turning blue   nystatin  (MYCOSTATIN ) 100000 UNIT/ML suspension Swish and swallow 5 mls four times daily for 14 days   oxyCODONE  (ROXICODONE ) 15 MG immediate release tablet Take 1 tablet (15 mg total) by mouth in the morning, at noon, in the evening, and at bedtime if needed for pain   oxyCODONE  (ROXICODONE ) 15 MG immediate release tablet Take 1 tablet (15 mg total) by mouth 4 (four)  times daily as needed for pain.   Oxycodone  HCl 10 MG TABS Take 0.5-1 tablets (5-10 mg total) by mouth 5 (five) times daily as needed,   pantoprazole  (PROTONIX ) 40 MG tablet TAKE 1 TABLET(40 MG) BY MOUTH TWICE DAILY   predniSONE  (DELTASONE ) 5 MG tablet Take 5 mg by mouth daily.   sulfamethoxazole -trimethoprim  (BACTRIM ) 400-80 MG tablet Take 1 tablet by mouth every Monday, Wednesday, and Friday.   terazosin  (HYTRIN ) 2 MG capsule Take 2 capsules (4 mg total) by mouth at bedtime.   No current facility-administered medications for this visit. (Other)   REVIEW OF SYSTEMS: ROS   Positive for: Musculoskeletal, Eyes Last  edited by Elnor Avelina RAMAN, COT on 07/23/2024  2:50 PM.       ALLERGIES Allergies  Allergen Reactions   Belatacept  Anaphylaxis    hypotensive with altered reaction and required epinephrine .  Needed to be intubated for airway protection.   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   Diabetes mellitus without complication [facility])    no since pancreas transplant   Dialysis patient [facility]) 04/18/2012   [facility] [place], [place], Sat   Gastroparesis    Gastropathy    GERD (gastroesophageal reflux disease)    Hypertension    MRSA infection    over 10 years ago per patient. in legs   Past Surgical History:  Procedure Laterality Date   AV FISTULA PLACEMENT  08/2011   left upper arm   BELOW KNEE LEG AMPUTATION  it's been awhile   bilaterally   CATARACT EXTRACTION  ~ 2011   right   COMBINED KIDNEY-PANCREAS TRANSPLANT  2014   ESOPHAGOGASTRODUODENOSCOPY N/A 12/30/2016   Procedure: ESOPHAGOGASTRODUODENOSCOPY (EGD);  Surgeon: Belvie Just, MD;  [facility] END[facility]COPY;  Service: Endoscopy;  Laterality: N/A;   OLECRANON BURSECTOMY Right 06/23/2020   Procedure: RIGHT ELBOW EXCISION OLECRANON BURSITIS;  Surgeon: Harden Jerona GAILS, MD;  Location: [facility];  Service: Orthopedics;  Laterality: Right;   FAMILY HISTORY Family History  Problem Relation Age of Onset   Hypertension Mother    Diabetes Mother    Kidney disease Mother    Diabetes Maternal Grandmother    Diabetes Paternal Grandmother    Diabetes Other    Hypertension Other    Lung cancer Maternal Aunt    Colon cancer Neg Hx    Esophageal cancer Neg Hx    Rectal cancer Neg Hx    Stomach cancer Neg Hx    SOCIAL HISTORY Social  History   Tobacco Use   Smoking status: Former    Types: Cigars    Quit date: 07/06/2022    Years since quitting: 2.0   Smokeless tobacco: Never   Tobacco comments:    black and mild  Vaping Use   Vaping status: Never Used  Substance Use Topics   Alcohol use: No   Drug use: Not Currently    Frequency: 3.0 times per week    Types: Marijuana    Comment: Occasionally       OPHTHALMIC EXAM:  Base Eye Exam     Visual Acuity (Snellen - Linear)  Right Left   Dist [place] 20/40 20/150 +2   Dist ph [place] 20/25 -1 NI         Tonometry (Tonopen, 2:59 PM)       Right Left   Pressure 23 28         Pupils       Pupils Dark Light Shape React APD   Right PERRL 3 2 Round Brisk None   Left PERRL 3 2 Round Brisk None         Visual Fields       Left Right    Full Full         Extraocular Movement       Right Left    Full, Ortho Full, Ortho         Neuro/Psych     Oriented x3: Yes   Mood/Affect: Normal         Dilation     Both eyes: 1.0% Mydriacyl, 2.5% Phenylephrine  @ 2:59 PM           Slit Lamp and Fundus Exam     Slit Lamp Exam       Right Left   Lids/Lashes Dermatochalasis - upper lid Dermatochalasis - upper lid   Conjunctiva/Sclera Melanosis Melanosis   Cornea well healed cataract wound, trace PEE, trace tear film debris well healed cataract wound, trace PEE, tear film debris   Anterior Chamber deep and clear deep and clear   Iris Round and moderately dilated, No NVI Round and moderately dilated, No NVI   Lens PC IOL in good position with open PC PC IOL in good position with open PC   Anterior Vitreous White blood stained vit condensations settling inferiorly. Old white blood stained vitreous condensations-improving         Fundus Exam       Right Left   Disc 3+Pallor, Sharp rim, +fibrosis 3+Pallor, Sharp rim, +fibrosis, vascular loops / NVD-regressing   C/D Ratio 0.3 0.3   Macula Flat, Blunted foveal reflex, scattered MA / DBH,  central cystic changes slightly increased, +atrophy Flat, good foveal reflex, scattered fibrosis, scattered MA/DBH   Vessels attenuated, Tortuous, +NV severe attenuation, Tortuous, +NV   Periphery Attached, scattered MA / DBH Attached, scattered MA, diffuse atrophy           IMAGING AND PROCEDURES  Imaging and Procedures for 07/23/2024  OCT, Retina - OU - Both Eyes       Right Eye Quality was good. Central Foveal Thickness: 285. Progression has been stable. Findings include no SRF, abnormal foveal contour, intraretinal fluid, inner retinal atrophy, vitreomacular adhesion (Diffuse IRA, central cystic changes, +vitreous opacities -- stably improved).   Left Eye Quality was good. Central Foveal Thickness: 229. Progression has been stable. Findings include no SRF, abnormal foveal contour, epiretinal membrane, intraretinal fluid, vitreous traction, preretinal fibrosis (Diffuse atrophy, prominent posterior hyaloid with mild interval improvement in subhyaloid opacities, focal tractional edema along ST arcades).   Notes *Images captured and stored on drive  Diagnosis / Impression:  OD: Diffuse IRA, central cystic changes, +vitreous opacities -- stably improved OS: Diffuse atrophy, prominent posterior hyaloid with mild interval improvement in subhyaloid opacities, focal tractional edema along ST arcades  Clinical management:  See below  Abbreviations: NFP - Normal foveal profile. CME - cystoid macular edema. PED - pigment epithelial detachment. IRF - intraretinal fluid. SRF - subretinal fluid. EZ - ellipsoid zone. ERM - epiretinal membrane. ORA - outer retinal atrophy. ORT - outer retinal tubulation. SRHM -  subretinal hyper-reflective material. IRHM - intraretinal hyper-reflective material      Panretinal Photocoagulation - OS - Left Eye       LASER PROCEDURE NOTE  Diagnosis:   Proliferative Diabetic Retinopathy, LEFT EYE  Procedure:  Pan-retinal photocoagulation using slit lamp  laser, LEFT EYE  Anesthesia:  Topical  Surgeon: Redell Hans, MD, PhD   Informed consent obtained, operative eye marked, and time out performed prior to initiation of laser.   Lumenis [id number] slit lamp laser Pattern: 3x3 square Power: 270 mW Duration: 30 msec  Spot size: 200 microns  # spots: 739 spots  Complications: None.  Notes: poor dilation limited peripheral visualization and laser placement  RTC: Aug 18 or later - DFE/OCT, possible injxns  Patient tolerated the procedure well and received written and verbal post-procedure care information/education.           ASSESSMENT/PLAN:   ICD-10-CM   1. Proliferative diabetic retinopathy of both eyes with macular edema associated with type 2 diabetes mellitus (HCC)  E11.3513 OCT, Retina - OU - Both Eyes    Panretinal Photocoagulation - OS - Left Eye    2. Optic neuropathy  H46.9     3. Essential hypertension  I10     4. Hypertensive retinopathy of both eyes  H35.033     5. 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 Pseudophakia, both eyes  Z96.1     7. History of stroke  Z86.73      **Pt reports decreased vision OU since having stroke in Sept 2024**  1. Proliferative diabetic retinopathy, both eyes  - IVA OU #1 (05.23.25), #2 (06.23.25), #3 (07.21.25)  - last A1c 6.4 on 04.30.25 -- s/p kidney and pancreas transplant - exam shows blood stained vit condensations OU (OD red; OS white); scattered NV/fibrosis OU - FA (05.23.25) shows OD: Large areas of vascular non-perfusion superior, nasal and inferior midzone, leaking NV superior and inferior to disc; OS: Large areas of vascular non-perfusion superior, nasal and inferior midzone, leaking NV greatest nasal to disc and along temporal arcades -- pt will need PRP OU - OCT shows OD: Diffuse IRA, central cystic changes--slightly increased, +vitreous opacities- improved; OS: Diffuse atrophy, prominent posterior hyaloid with interval improvement in subhyaloid opacities,  focal tractional edema along ST arcades - BCVA OD 20/25 from 20/20; OS 20/150 from 20/80 - recommend PRP OS today, 07.29.25 - pt wishes to proceed with laser - RBA of procedure discussed, questions answered - IVA informed consent obtained and signed, 05.23.25 - see procedure note  - start PF QID OS x7 days - f/u August 18 or later, DFE, OCT  2. Bilateral optic neuropathy  - thought to be secondary to tacrolimus  use  - previously managed and monitored by Dr. Merita visit 2022  - exam shows 3+ disc pallor OU  - monitor  3,4. Hypertensive retinopathy OU - discussed importance of tight BP control - monitor  5. Ocular Hypertension OU  - IOP 23,28  - h/o possible adverse rxn to Brimonidine   - switched to Cosopt  BID OU - monitor  6. Pseudophakia OU  - s/p CE/IOL  - IOL in good position, doing well  - monitor  7. H/o CVA - Sept .2024  - occurred while receiving belatacept  infusion  - MRI Brain 09.23.24   IMPRESSION: 1. Scattered small foci of acute subcortical infarction in the left occipital lobe and posterior left frontal lobe. 2. Faint diffusion signal abnormality along the left parietal lobe may reflect evolving infarct  versus seizure related cytotoxic edema. 3. 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  Ophthalmic Meds Ordered this visit:  Meds ordered this encounter  Medications   prednisoLONE  acetate (PRED FORTE ) 1 % ophthalmic suspension    Sig: Place 1 drop into the left eye 4 (four) times daily for 7 days.    Dispense:  10 mL    Refill:  0     Return in about 20 days (around 08/12/2024) for PDR OU, Dilated Exam, OCT, Possible Injxn.  There are no Patient Instructions on file for this visit.  Explained the diagnoses, plan, and follow up with the patient and they expressed understanding.  Patient expressed understanding of the importance of proper follow up care.   This document serves as a record of services personally  performed by Redell JUDITHANN Hans, MD, PhD. It was created on their behalf by Auston Muzzy, COMT. The creation of this record is the provider's dictation and/or activities during the visit.  Electronically signed by: Auston Muzzy, COMT 07/23/24 9:30 PM  This document serves as a record of services personally performed by Redell JUDITHANN Hans, MD, PhD. It was created on their behalf by Alan PARAS. Delores, OA an ophthalmic technician. The creation of this record is the provider's dictation and/or activities during the visit.    Electronically signed by: Alan PARAS. Delores, OA 07/23/24 9:30 PM  Redell JUDITHANN Hans, M.D., Ph.D. Diseases & Surgery of the Retina and Vitreous [facility]  I have reviewed the above documentation for accuracy and completeness, and I agree with the above. Redell JUDITHANN Hans, M.D., Ph.D. 07/23/24 9:32 PM   Abbreviations: M myopia (nearsighted); A astigmatism; H hyperopia (farsighted); P presbyopia; Mrx spectacle prescription;  CTL contact lenses; OD right eye; OS left eye; OU both eyes  XT exotropia; ET esotropia; PEK punctate epithelial keratitis; PEE punctate epithelial erosions; DES dry eye syndrome; MGD meibomian gland dysfunction; ATs artificial tears; PFAT's preservative free artificial tears; NSC nuclear sclerotic cataract; PSC posterior subcapsular cataract; ERM epi-retinal membrane; PVD posterior vitreous detachment; RD retinal detachment; DM diabetes mellitus; DR diabetic retinopathy; NPDR non-proliferative diabetic retinopathy; PDR proliferative diabetic retinopathy; CSME clinically significant macular edema; DME diabetic macular edema; dbh dot blot hemorrhages; CWS cotton wool spot; POAG primary open angle glaucoma; C/D cup-to-disc ratio; HVF humphrey visual field; GVF goldmann visual field; OCT optical coherence tomography; IOP intraocular pressure; BRVO Branch retinal vein occlusion; CRVO central retinal vein occlusion; CRAO central retinal artery occlusion; BRAO  branch retinal artery occlusion; RT retinal tear; SB scleral buckle; PPV pars plana vitrectomy; VH Vitreous hemorrhage; PRP panretinal laser photocoagulation; IVK intravitreal kenalog; VMT vitreomacular traction; MH Macular hole;  NVD neovascularization of the disc; NVE neovascularization elsewhere; AREDS age related eye disease study; ARMD age related macular degeneration; POAG primary open angle glaucoma; EBMD epithelial/anterior basement membrane dystrophy; ACIOL anterior chamber intraocular lens; IOL intraocular lens; PCIOL posterior chamber intraocular lens; Phaco/IOL phacoemulsification with intraocular lens placement; PRK photorefractive keratectomy; LASIK laser assisted in situ keratomileusis; HTN hypertension; DM diabetes mellitus; COPD chronic obstructive pulmonary disease

## 2024-07-23 ENCOUNTER — Encounter (INDEPENDENT_AMBULATORY_CARE_PROVIDER_SITE_OTHER): Payer: Self-pay | Admitting: Ophthalmology

## 2024-07-23 ENCOUNTER — Ambulatory Visit (INDEPENDENT_AMBULATORY_CARE_PROVIDER_SITE_OTHER): Admitting: Ophthalmology

## 2024-07-23 ENCOUNTER — Encounter: Admitting: Physical Medicine & Rehabilitation

## 2024-07-23 DIAGNOSIS — H469 Unspecified optic neuritis: Secondary | ICD-10-CM

## 2024-07-23 DIAGNOSIS — I10 Essential (primary) hypertension: Secondary | ICD-10-CM | POA: Diagnosis not present

## 2024-07-23 DIAGNOSIS — H40053 Ocular hypertension, bilateral: Secondary | ICD-10-CM

## 2024-07-23 DIAGNOSIS — H35033 Hypertensive retinopathy, bilateral: Secondary | ICD-10-CM

## 2024-07-23 DIAGNOSIS — E113513 Type 2 diabetes mellitus with proliferative diabetic retinopathy with macular edema, bilateral: Secondary | ICD-10-CM

## 2024-07-23 DIAGNOSIS — Z8673 Personal history of transient ischemic attack (TIA), and cerebral infarction without residual deficits: Secondary | ICD-10-CM

## 2024-07-23 DIAGNOSIS — Z961 Presence of intraocular lens: Secondary | ICD-10-CM

## 2024-07-23 MED ORDER — PREDNISOLONE ACETATE 1 % OP SUSP: 1.0000 [drp] | Freq: Four times a day (QID) | OPHTHALMIC | 0 refills | Status: AC

## 2024-07-27 ENCOUNTER — Other Ambulatory Visit: Payer: Self-pay | Admitting: Internal Medicine

## 2024-07-27 DIAGNOSIS — Z940 Kidney transplant status: Secondary | ICD-10-CM

## 2024-07-27 DIAGNOSIS — I151 Hypertension secondary to other renal disorders: Secondary | ICD-10-CM

## 2024-07-29 ENCOUNTER — Ambulatory Visit (HOSPITAL_COMMUNITY)
Admission: RE | Admit: 2024-07-29 | Discharge: 2024-07-29 | Disposition: A | Discharge status: Home / Self Care | Source: Ambulatory Visit | Attending: Otolaryngology | Admitting: Otolaryngology

## 2024-07-29 ENCOUNTER — Other Ambulatory Visit: Payer: Self-pay

## 2024-07-29 DIAGNOSIS — R221 Localized swelling, mass and lump, neck: Secondary | ICD-10-CM | POA: Diagnosis not present

## 2024-07-30 ENCOUNTER — Other Ambulatory Visit: Payer: Self-pay

## 2024-07-30 ENCOUNTER — Encounter: Attending: Physical Medicine & Rehabilitation | Admitting: Physical Medicine & Rehabilitation

## 2024-07-30 ENCOUNTER — Encounter: Payer: Self-pay | Admitting: Physical Medicine & Rehabilitation

## 2024-07-30 VITALS — BP 179/85 | HR 68 | Ht 73.0 in

## 2024-07-30 DIAGNOSIS — I69351 Hemiplegia and hemiparesis following cerebral infarction affecting right dominant side: Secondary | ICD-10-CM | POA: Insufficient documentation

## 2024-07-30 DIAGNOSIS — N183 Chronic kidney disease, stage 3 unspecified: Secondary | ICD-10-CM | POA: Diagnosis not present

## 2024-07-30 NOTE — Telephone Encounter (Signed)
 Last OV 04/24/24 Next OV 09/11/24  Last refill(s) Terazosin  04/24/24 Qty #180/0  Bactrim  01/15/24 Qty #12/0

## 2024-07-30 NOTE — Progress Notes (Signed)
 Subjective:    Patient ID: [name], male    DOB: 1976-10-07, 48 y.o.   MRN: [id number]  HPI  48 year old male with history of pancreatic transplant due to type 1 diabetes as well as bilateral BKA as well as CVA with right upper extremity greater than lower extremity weakness returns today for stroke rehabilitation follow-up.  His visual issues have improved somewhat after being treated for retinopathy with laser treatments by the retina specialist Dr. Valdemar. He is independent with dressing he ambulates in the house with a cane and his bilateral BKA prosthetics.  Outside the home he uses a manual wheelchair. He has had no falls or other major issues in the last month or 2. He is accompanied by his spouse Pain Inventory Average Pain 7 Pain Right Now 7 My pain is constant and aching  In the last 24 hours, has pain interfered with the following? General activity 5 Relation with others 5 Enjoyment of life 5 What TIME of day is your pain at its worst? morning  and night Sleep (in general) Good  Pain is worse with: walking and standing Pain improves with: rest and medication Relief from Meds: good  Family History  Problem Relation Age of Onset   Hypertension Mother    Diabetes Mother    Kidney disease Mother    Diabetes Maternal Grandmother    Diabetes Paternal Grandmother    Diabetes Other    Hypertension Other    Lung cancer Maternal Aunt    Colon cancer Neg Hx    Esophageal cancer Neg Hx    Rectal cancer Neg Hx    Stomach cancer Neg Hx    Social History   Socioeconomic History   Marital status: Married    Spouse name: Not on file   Number of children: 1   Years of education: 11   Highest education level: Not on file  Occupational History   Occupation: Disability  Tobacco Use   Smoking status: Former    Types: Cigars    Quit date: 07/06/2022    Years since quitting: 2.0   Smokeless tobacco: Never   Tobacco comments:    black and mild  Vaping  Use   Vaping status: Never Used  Substance and Sexual Activity   Alcohol use: No   Drug use: Not Currently    Frequency: 3.0 times per week    Types: Marijuana    Comment: Occasionally   Sexual activity: Yes    Partners: Female  Other Topics Concern   Not on file  Social History Narrative   Fun: Restore old cars    Social Drivers of Health   Financial Resource Strain: Low Risk  (09/11/2023)   Overall Financial Resource Strain (CARDIA)    Difficulty of Paying Living Expenses: Not hard at all  Food Insecurity: No Food Insecurity (09/29/2023)   Hunger Vital Sign    Worried About Running Out of Food in the Last Year: Never true    Ran Out of Food in the Last Year: Never true  Transportation Needs: No Transportation Needs (09/20/2023)   PRAPARE - Administrator, Civil Service (Medical): No    Lack of Transportation (Non-Medical): No  Physical Activity: Inactive (09/11/2023)   Exercise Vital Sign    Days of Exercise per Week: 0 days    Minutes of Exercise per Session: 0 min  Stress: No Stress Concern Present (09/11/2023)   [employer] of Occupational Health - Occupational Stress Questionnaire  Feeling of Stress : Not at all  Social Connections: Socially Integrated (09/11/2023)   Social Connection and Isolation Panel    Frequency of Communication with Friends and Family: More than three times a week    Frequency of Social Gatherings with Friends and Family: More than three times a week    Attends Religious Services: More than 4 times per year    Active Member of Clubs or Organizations: Yes    Attends Engineer, structural: More than 4 times per year    Marital Status: Married   Past Surgical History:  Procedure Laterality Date   AV FISTULA PLACEMENT  08/2011   left upper arm   BELOW KNEE LEG AMPUTATION  it's been awhile   bilaterally   CATARACT EXTRACTION  ~ 2011   right   COMBINED KIDNEY-PANCREAS TRANSPLANT  2014   ESOPHAGOGASTRODUODENOSCOPY N/A  12/30/2016   Procedure: ESOPHAGOGASTRODUODENOSCOPY (EGD);  Surgeon: Belvie Just, MD;  Location: [facility] ENDOSCOPY;  Service: Endoscopy;  Laterality: N/A;   OLECRANON BURSECTOMY Right 06/23/2020   Procedure: RIGHT ELBOW EXCISION OLECRANON BURSITIS;  Surgeon: Harden Jerona GAILS, MD;  Location: [facility];  Service: Orthopedics;  Laterality: Right;   Past Surgical History:  Procedure Laterality Date   AV FISTULA PLACEMENT  08/2011   left upper arm   BELOW KNEE LEG AMPUTATION  it's been awhile   bilaterally   CATARACT EXTRACTION  ~ 2011   right   COMBINED KIDNEY-PANCREAS TRANSPLANT  2014   ESOPHAGOGASTRODUODENOSCOPY N/A 12/30/2016   Procedure: ESOPHAGOGASTRODUODENOSCOPY (EGD);  Surgeon: Belvie Just, MD;  Location: [facility] ENDOSCOPY;  Service: Endoscopy;  Laterality: N/A;   OLECRANON BURSECTOMY Right 06/23/2020   Procedure: RIGHT ELBOW EXCISION OLECRANON BURSITIS;  Surgeon: Harden Jerona GAILS, MD;  Location: [facility];  Service: Orthopedics;  Laterality: Right;   Past Medical History:  Diagnosis Date   AMPUTATION, BELOW KNEE, 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   Diabetes mellitus without complication [facility])    no since pancreas transplant   Dialysis patient [facility]) 04/18/2012   [facility]; [place], [place], Sat   Gastroparesis    Gastropathy    GERD (gastroesophageal reflux disease)    Hypertension    MRSA infection    over 10 years ago per patient. in legs   Ht 6' 1 (1.854 m)   BMI 28.63 kg/m   Opioid Risk Score:   Fall Risk Score:  `1  Depression screen PHQ 2/9     04/23/2024    1:34 PM 01/18/2024    2:57 PM 10/31/2023   11:15 AM 09/11/2023    3:36 PM 09/06/2022    4:08 PM 08/26/2016   10:04 AM 04/22/2016    9:41 AM  Depression screen PHQ 2/9  Decreased Interest 0 0 0 0 0 0 0  Down, Depressed,  Hopeless 0 0 0 0 0 0 0  PHQ - 2 Score 0 0 0 0 0 0 0  Altered sleeping   0 0     Tired, decreased energy   0 0     Change in appetite   0 0     Feeling bad or failure about yourself    0 0     Trouble concentrating   0  0     Moving slowly or fidgety/restless   1 0     Suicidal thoughts   0 0     PHQ-9 Score   1 0     Difficult doing work/chores   Somewhat difficult Not difficult at all        Review of Systems  Musculoskeletal:  Positive for back pain and gait problem.       Pain in both knees & upper back  All other systems reviewed and are negative.      Objective:   Physical Exam Vitals and nursing note reviewed.  Constitutional:      Appearance: Normal appearance.  HENT:     Head: Normocephalic and atraumatic.  Eyes:     General: No scleral icterus.    Extraocular Movements: Extraocular movements intact.  Neurological:     Mental Status: He is alert and oriented to person, place, and time.     Cranial Nerves: Dysarthria present. No cranial nerve deficit or facial asymmetry.     Motor: Weakness present. No abnormal muscle tone.     Coordination: Impaired rapid alternating movements.     Comments: Motor strength is 4/5 in the right deltoid bicep tricep grip, 5/5 in the left deltoid bicep tricep grip 5/5 bilateral hip flexors and knee extensors tested with prosthetics on           Assessment & Plan:  1.  History of left ICA distribution watershed type infarcts he continues to follow-up with neurology.  We discussed that at this point in his recovery which is almost 1 year post stroke we do not expect any major improvements in his upper extremity strength.  We discussed that he should continue with his home exercise program to avoid losing the gains that he has accomplished through inpatient and outpatient rehab. 2.  Bilateral BKA follows with [facility]. 3.  Diabetic proliferative retinopathy seeing Dr. Valdemar from retina specialist for laser treatments. As  discussed with patient and his wife he is made an excellent recovery given all his functional issues.  I will see him back on a as needed basis he is to follow-up with PCP, neurology as well as ophthalmology. In addition patient does see physiatry for pain management, Dr. Dorrine at [facility]

## 2024-07-31 ENCOUNTER — Encounter: Payer: Self-pay | Admitting: Internal Medicine

## 2024-08-02 ENCOUNTER — Other Ambulatory Visit: Payer: Self-pay

## 2024-08-02 DIAGNOSIS — Z940 Kidney transplant status: Secondary | ICD-10-CM

## 2024-08-02 MED ORDER — SULFAMETHOXAZOLE-TRIMETHOPRIM 400-80 MG PO TABS: 1.0000 | ORAL_TABLET | ORAL | 0 refills | Status: AC

## 2024-08-08 NOTE — Progress Notes (Signed)
 [facility] - Clinic Note  08/14/2024   CHIEF COMPLAINT Patient presents for Retina Follow Up  HISTORY OF PRESENT ILLNESS: [name] is a 48 y.o. male who presents to the clinic today for:  HPI     Retina Follow Up   Patient presents with  Diabetic Retinopathy.  In both eyes.  This started 5 months ago.  Duration of 3 weeks.  I, the attending physician,  performed the HPI with the patient and updated documentation appropriately.        Comments   Pt states vision seems to be improving. Pt denies FOL/floaters/pain. Pt has only been using PF QID OS, no IOP meds.       Last edited by [name], MD on 08/20/2024 10:58 PM.    Pt states VA is stable  Referring physician: [name], MD [address] [place],  [place] [address]  HISTORICAL INFORMATION:  Selected notes from the MEDICAL RECORD NUMBER Referred by Dr. Hamilton Octavia for PDR OU LEE:  Ocular Hx- previously followed with Dr. 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   dorzolamide -timolol  (COSOPT ) 2-0.5 % ophthalmic solution Place 1 drop into both eyes 2 (two) times daily.   No current facility-administered medications for this visit. (Ophthalmic Drugs)   Current Outpatient Medications (Other)  Medication Sig   acetaminophen  (TYLENOL ) 325 MG tablet Take 1-2 tablets (325-650 mg total) by mouth every 4 (four) hours as needed for mild pain (pain score 1-3).   aspirin  EC 81 MG tablet Take 1 tablet (81 mg total) by mouth daily. Swallow whole.   atorvastatin  (LIPITOR) 40 MG tablet Take 1 tablet (40 mg total) by mouth daily.   carvedilol  (COREG ) 12.5 MG  tablet Take 1 tablet (12.5 mg total) by mouth 2 (two) times daily with a meal.   cycloSPORINE  modified (NEORAL ) 100 MG capsule Take 100 mg by mouth 2 (two) times daily. Taken with 50 mg tablet   cycloSPORINE  modified (NEORAL ) 50 MG capsule Take 1 capsule by mouth two times daily, Take along with 100 mg twice a day   fluconazole  (DIFLUCAN ) 200 MG tablet Take 1 tablet (200 mg total) by mouth daily. (Patient not taking: Reported on 04/23/2024)   hydrALAZINE  (APRESOLINE ) 50 MG tablet TAKE 1 TABLET(50 MG) BY MOUTH THREE TIMES DAILY   mycophenolate  (MYFORTIC ) 180 MG EC tablet TAKE 1 TABLET BY MOUTH TWICE DAILY   naloxone  (NARCAN ) nasal spray 4 mg/0.1 mL Inhale 1 (one) Spray if poorly responding or lips turning blue   naloxone  (NARCAN ) nasal spray 4 mg/0.1 mL Place 1 spray if poorly responding or lips turning blue (Patient not taking: Reported on 07/30/2024)   nystatin  (MYCOSTATIN ) 100000 UNIT/ML suspension Swish and swallow 5 mls four times daily for 14 days (Patient not taking: Reported on 07/30/2024)   oxyCODONE  (ROXICODONE ) 15 MG immediate release tablet Take 1 tablet (15 mg total) by mouth in the morning, at noon, in the evening, and at bedtime if needed for pain   oxyCODONE  (ROXICODONE ) 15 MG immediate release tablet Take 1 tablet (15 mg total) by mouth 4 (four) times daily as needed for pain. (Patient not taking: Reported on  07/30/2024)   oxyCODONE  (ROXICODONE ) 15 MG immediate release tablet Take 1 tablet (15 mg total) by mouth 4 (four) times daily as needed.   Oxycodone  HCl 10 MG TABS Take 0.5-1 tablets (5-10 mg total) by mouth 5 (five) times daily as needed,   pantoprazole  (PROTONIX ) 40 MG tablet TAKE 1 TABLET(40 MG) BY MOUTH TWICE DAILY   predniSONE  (DELTASONE ) 5 MG tablet Take 5 mg by mouth daily.   sulfamethoxazole -trimethoprim  (BACTRIM ) 400-80 MG tablet Take 1 tablet by mouth every Monday, Wednesday, and Friday.   terazosin  (HYTRIN ) 2 MG capsule TAKE 2 CAPSULES(4 MG) BY MOUTH AT BEDTIME   No current  facility-administered medications for this visit. (Other)   REVIEW OF SYSTEMS: ROS   Positive for: Musculoskeletal, Eyes Last edited by Elnor Avelina RAMAN, COT on 08/14/2024  2:22 PM.     ALLERGIES Allergies  Allergen Reactions   Belatacept  Anaphylaxis    hypotensive with altered reaction and required epinephrine .  Needed to be intubated for airway protection.   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   Diabetes mellitus without complication [facility])    no since pancreas transplant   Dialysis patient [facility]) 04/18/2012   [facility]; [place], [place], [place]   Gastroparesis    Gastropathy    GERD (gastroesophageal reflux disease)    Hypertension    MRSA infection    over 10 years ago per patient. in legs   Past Surgical History:  Procedure Laterality Date   AV FISTULA PLACEMENT  08/2011   left upper arm   BELOW KNEE LEG AMPUTATION  it's been awhile   bilaterally   CATARACT EXTRACTION  ~ 2011   right   COMBINED KIDNEY-PANCREAS TRANSPLANT  2014   ESOPHAGOGASTRODUODENOSCOPY N/A 12/30/2016   Procedure: ESOPHAGOGASTRODUODENOSCOPY (EGD);  Surgeon: Belvie Just, MD;  Location: [facility] ENDOSCOPY;  Service: Endoscopy;  Laterality: N/A;   OLECRANON BURSECTOMY Right 06/23/2020   Procedure: RIGHT ELBOW EXCISION OLECRANON BURSITIS;  Surgeon: Harden Jerona GAILS, MD;  Location: [facility];  Service: Orthopedics;  Laterality: Right;   FAMILY HISTORY Family History  Problem Relation Age of Onset   Hypertension Mother    Diabetes Mother    Kidney disease Mother    Diabetes Maternal Grandmother    Diabetes Paternal Grandmother    Diabetes Other    Hypertension Other    Lung cancer Maternal Aunt    Colon  cancer Neg Hx    Esophageal cancer Neg Hx    Rectal cancer Neg Hx    Stomach cancer Neg Hx    SOCIAL HISTORY Social History   Tobacco Use   Smoking status: Former    Types: Cigars    Quit date: 07/06/2022    Years since quitting: 2.1   Smokeless tobacco: Never   Tobacco comments:    black and mild  Vaping Use   Vaping status: Never Used  Substance Use Topics   Alcohol use: No   Drug use: Not Currently    Frequency: 3.0 times per week    Types: Marijuana    Comment: Occasionally       OPHTHALMIC EXAM:  Base Eye  Exam     Visual Acuity (Snellen - Linear)       Right Left   Dist [place] 20/40 20/150 +2   Dist ph [place] 20/25 20/100 +1         Tonometry (Tonopen, 2:16 PM)       Right Left   Pressure 20 29         Pupils       Pupils Dark Light Shape React APD   Right PERRL 3 2 Round Brisk None   Left PERRL 3 2 Round Brisk None         Visual Fields       Left Right    Full Full         Extraocular Movement       Right Left    Full, Ortho Full, Ortho         Neuro/Psych     Oriented x3: Yes   Mood/Affect: Normal         Dilation     Both eyes: 1.0% Mydriacyl, 2.5% Phenylephrine  @ 2:18 PM           Slit Lamp and Fundus Exam     Slit Lamp Exam       Right Left   Lids/Lashes Dermatochalasis - upper lid Dermatochalasis - upper lid   Conjunctiva/Sclera Melanosis Melanosis   Cornea well healed cataract wound, trace PEE, trace tear film debris well healed cataract wound, trace PEE, tear film debris   Anterior Chamber deep and clear deep and clear   Iris Round and moderately dilated, No NVI Round and moderately dilated, No NVI   Lens PC IOL in good position with open PC PC IOL in good position with open PC   Anterior Vitreous White blood stained vit condensations settling inferiorly. Old white blood stained vitreous condensations-improving         Fundus Exam       Right Left   Disc 3+Pallor, Sharp rim, +fibrosis 3+Pallor, Sharp  rim, +fibrosis, vascular loops / NVD-regressing   C/D Ratio 0.3 0.3   Macula Flat, Blunted foveal reflex, scattered MA / DBH, central cystic changes, +atrophy Flat, good foveal reflex, scattered fibrosis, scattered MA/DBH   Vessels attenuated, Tortuous, +NV severe attenuation, Tortuous, +NV   Periphery Attached, scattered MA / DBH Attached, scattered MA, diffuse atrophy, good PRP changes 360, room for fill in inferiorly           IMAGING AND PROCEDURES  Imaging and Procedures for 08/14/2024  OCT, Retina - OU - Both Eyes       Right Eye Quality was good. Central Foveal Thickness: 282. Progression has been stable. Findings include no SRF, abnormal foveal contour, intraretinal fluid, inner retinal atrophy, vitreomacular adhesion (Diffuse IRA, central cystic changes, +vitreous opacities -- stably improved).   Left Eye Quality was good. Central Foveal Thickness: 230. Progression has improved. Findings include no SRF, abnormal foveal contour, epiretinal membrane, intraretinal fluid, vitreous traction, preretinal fibrosis (Diffuse atrophy, prominent posterior hyaloid with mild interval improvement in subhyaloid opacities, focal tractional edema along ST arcades).   Notes *Images captured and stored on drive  Diagnosis / Impression:  OD: Diffuse IRA, central cystic changes, +vitreous opacities -- stably improved OS: Diffuse atrophy, prominent posterior hyaloid with mild interval improvement in subhyaloid opacities, focal tractional edema along ST arcades  Clinical management:  See below  Abbreviations: NFP - Normal foveal profile. CME - cystoid macular edema. PED - pigment epithelial detachment. IRF - intraretinal fluid. SRF -  subretinal fluid. EZ - ellipsoid zone. ERM - epiretinal membrane. ORA - outer retinal atrophy. ORT - outer retinal tubulation. SRHM - subretinal hyper-reflective material. IRHM - intraretinal hyper-reflective material      Intravitreal Injection, Pharmacologic  Agent - OD - Right Eye       Time Out 08/14/2024. 3:58 PM. Confirmed correct patient, procedure, site, and patient consented.   Anesthesia Topical anesthesia was used. Anesthetic medications included Lidocaine  2%, Proparacaine 0.5%.   Procedure Preparation included 5% betadine to ocular surface, eyelid speculum.   Injection: 1.25 mg Bevacizumab  1.25mg /0.43ml   Route: Intravitreal, Site: Right Eye   NDC: C2662926, Lot: [id number], Expiration date: 09/26/2024   Post-op Post injection exam found visual acuity of at least counting fingers. The patient tolerated the procedure well. There were no complications. The patient received written and verbal post procedure care education.      Intravitreal Injection, Pharmacologic Agent - OS - Left Eye       Time Out 08/14/2024. 3:58 PM. Confirmed correct patient, procedure, site, and patient consented.   Anesthesia Topical anesthesia was used. Anesthetic medications included Lidocaine  2%, Proparacaine 0.5%.   Procedure Preparation included 5% betadine to ocular surface, eyelid speculum. A (32g) needle was used.   Injection: 1.25 mg Bevacizumab  1.25mg /0.16ml   Route: Intravitreal, Site: Left Eye   NDC: C2662926, Lot: [id number], Expiration date: 11/21/2024   Post-op Post injection exam found visual acuity of at least counting fingers. The patient tolerated the procedure well. There were no complications. The patient received written and verbal post procedure care education.           ASSESSMENT/PLAN:   ICD-10-CM   1. Proliferative diabetic retinopathy of both eyes with macular edema associated with type 2 diabetes mellitus (HCC)  E11.3513 OCT, Retina - OU - Both Eyes    Intravitreal Injection, Pharmacologic Agent - OD - Right Eye    Intravitreal Injection, Pharmacologic Agent - OS - Left Eye    Bevacizumab  (AVASTIN ) SOLN 1.25 mg    Bevacizumab  (AVASTIN ) SOLN 1.25 mg    2. Optic neuropathy  H46.9     3. Essential hypertension   I10     4. Hypertensive retinopathy of both eyes  H35.033     5. 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 Pseudophakia, both eyes  Z96.1     7. History of stroke  Z86.73      **Pt reports decreased vision OU since having stroke in Sept 2024**  1. Proliferative diabetic retinopathy, both eyes  - last A1c 6.4 on 04.30.25 -- s/p kidney and pancreas transplant  - IVA OU #1 (05.23.25), #2 (06.23.25), #3 (07.21.25)  - s/p PRP OS 07.29.25 - exam shows blood stained vit condensations OU (OD red; OS white); scattered NV/fibrosis OU - FA (05.23.25) shows OD: Large areas of vascular non-perfusion superior, nasal and inferior midzone, leaking NV superior and inferior to disc; OS: Large areas of vascular non-perfusion superior, nasal and inferior midzone, leaking NV greatest nasal to disc and along temporal arcades -- pt will need PRP OU - OCT shows OD: Diffuse IRA, central cystic changes, +vitreous opacities- stably improved; OS: Diffuse atrophy, prominent posterior hyaloid with interval improvement in subhyaloid opacities, focal tractional edema along ST arcades - BCVA OD 20/25 - stable; OS 20/100 from 20/150 - IOP 29 OS today - stayed on PF post laser -- advised patient to dec PF to bid for 5 days, then stop,  - recommend IVA OU #4 today, 08.20.25 -  pt wishes to proceed with injection - RBA of procedure discussed, questions answered - IVA informed consent obtained and signed, 05.23.25 - see procedure note  - f/u 4 weeks DFE, OCT  2. Bilateral optic neuropathy  - thought to be secondary to tacrolimus  use  - previously managed and monitored by Dr. Merita visit 2022  - exam shows 3+ disc pallor OU  - monitor  3,4. Hypertensive retinopathy OU - discussed importance of tight BP control - monitor  5. Ocular Hypertension OU  - IOP 20,29 -- steroid response OS  - h/o possible adverse rxn to Brimonidine   - switched to Cosopt  BID OU - monitor  6. Pseudophakia OU  - s/p  CE/IOL  - IOL in good position, doing well  - monitor  7. H/o CVA - Sept .2024  - occurred while receiving belatacept  infusion  - MRI Brain 09.23.24   IMPRESSION: 1. Scattered small foci of acute subcortical infarction in the left occipital lobe and posterior left frontal lobe. 2. Faint diffusion signal abnormality along the left parietal lobe may reflect evolving infarct versus seizure related cytotoxic edema. 3. 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  Ophthalmic Meds Ordered this visit:  Meds ordered this encounter  Medications   Bevacizumab  (AVASTIN ) SOLN 1.25 mg   Bevacizumab  (AVASTIN ) SOLN 1.25 mg     Return in about 4 weeks (around 09/11/2024) for PDR OU, Dilated Exam, OCT, Possible Injxn.  There are no Patient Instructions on file for this visit.  Explained the diagnoses, plan, and follow up with the patient and they expressed understanding.  Patient expressed understanding of the importance of proper follow up care.   This document serves as a record of services personally performed by Redell JUDITHANN Hans, MD, PhD. It was created on their behalf by Avelina Pereyra, COA an ophthalmic technician. The creation of this record is the provider's dictation and/or activities during the visit.   Electronically signed by: Avelina GORMAN Pereyra, COT  08/20/24  10:58 PM    Redell JUDITHANN Hans, M.D., Ph.D. Diseases & Surgery of the Retina and Vitreous [facility]  I have reviewed the above documentation for accuracy and completeness, and I agree with the above. Redell JUDITHANN Hans, M.D., Ph.D. 08/20/24 11:03 PM   Abbreviations: M myopia (nearsighted); A astigmatism; H hyperopia (farsighted); P presbyopia; Mrx spectacle prescription;  CTL contact lenses; OD right eye; OS left eye; OU both eyes  XT exotropia; ET esotropia; PEK punctate epithelial keratitis; PEE punctate epithelial erosions; DES dry eye syndrome; MGD meibomian gland dysfunction; ATs  artificial tears; PFAT's preservative free artificial tears; NSC nuclear sclerotic cataract; PSC posterior subcapsular cataract; ERM epi-retinal membrane; PVD posterior vitreous detachment; RD retinal detachment; DM diabetes mellitus; DR diabetic retinopathy; NPDR non-proliferative diabetic retinopathy; PDR proliferative diabetic retinopathy; CSME clinically significant macular edema; DME diabetic macular edema; dbh dot blot hemorrhages; CWS cotton wool spot; POAG primary open angle glaucoma; C/D cup-to-disc ratio; HVF humphrey visual field; GVF goldmann visual field; OCT optical coherence tomography; IOP intraocular pressure; BRVO Branch retinal vein occlusion; CRVO central retinal vein occlusion; CRAO central retinal artery occlusion; BRAO branch retinal artery occlusion; RT retinal tear; SB scleral buckle; PPV pars plana vitrectomy; VH Vitreous hemorrhage; PRP panretinal laser photocoagulation; IVK intravitreal kenalog; VMT vitreomacular traction; MH Macular hole;  NVD neovascularization of the disc; NVE neovascularization elsewhere; AREDS age related eye disease study; ARMD age related macular degeneration; POAG primary open angle glaucoma; EBMD epithelial/anterior basement membrane dystrophy; ACIOL anterior  chamber intraocular lens; IOL intraocular lens; PCIOL posterior chamber intraocular lens; Phaco/IOL phacoemulsification with intraocular lens placement; PRK photorefractive keratectomy; LASIK laser assisted in situ keratomileusis; HTN hypertension; DM diabetes mellitus; COPD chronic obstructive pulmonary disease

## 2024-08-14 ENCOUNTER — Ambulatory Visit (INDEPENDENT_AMBULATORY_CARE_PROVIDER_SITE_OTHER): Admitting: Ophthalmology

## 2024-08-14 ENCOUNTER — Encounter (INDEPENDENT_AMBULATORY_CARE_PROVIDER_SITE_OTHER): Payer: Self-pay | Admitting: Ophthalmology

## 2024-08-14 DIAGNOSIS — I10 Essential (primary) hypertension: Secondary | ICD-10-CM

## 2024-08-14 DIAGNOSIS — E113513 Type 2 diabetes mellitus with proliferative diabetic retinopathy with macular edema, bilateral: Secondary | ICD-10-CM | POA: Diagnosis not present

## 2024-08-14 DIAGNOSIS — H40053 Ocular hypertension, bilateral: Secondary | ICD-10-CM | POA: Diagnosis not present

## 2024-08-14 DIAGNOSIS — H35033 Hypertensive retinopathy, bilateral: Secondary | ICD-10-CM | POA: Diagnosis not present

## 2024-08-14 DIAGNOSIS — Z961 Presence of intraocular lens: Secondary | ICD-10-CM | POA: Diagnosis not present

## 2024-08-14 DIAGNOSIS — H469 Unspecified optic neuritis: Secondary | ICD-10-CM | POA: Diagnosis not present

## 2024-08-14 DIAGNOSIS — Z8673 Personal history of transient ischemic attack (TIA), and cerebral infarction without residual deficits: Secondary | ICD-10-CM

## 2024-08-14 MED ORDER — BEVACIZUMAB CHEMO INJECTION 1.25MG/0.05ML SYRINGE FOR KALEIDOSCOPE
1.2500 mg | INTRAVITREAL | Status: AC | PRN
  Administered 2024-08-14: 1.25 mg via INTRAVITREAL

## 2024-08-20 ENCOUNTER — Other Ambulatory Visit: Payer: Self-pay

## 2024-08-20 DIAGNOSIS — Z79899 Other long term (current) drug therapy: Secondary | ICD-10-CM | POA: Diagnosis not present

## 2024-08-20 DIAGNOSIS — M549 Dorsalgia, unspecified: Secondary | ICD-10-CM | POA: Diagnosis not present

## 2024-08-20 DIAGNOSIS — G546 Phantom limb syndrome with pain: Secondary | ICD-10-CM | POA: Diagnosis not present

## 2024-08-20 DIAGNOSIS — M51369 Other intervertebral disc degeneration, lumbar region without mention of lumbar back pain or lower extremity pain: Secondary | ICD-10-CM | POA: Diagnosis not present

## 2024-08-20 DIAGNOSIS — N1831 Chronic kidney disease, stage 3a: Secondary | ICD-10-CM | POA: Diagnosis not present

## 2024-08-20 DIAGNOSIS — G8929 Other chronic pain: Secondary | ICD-10-CM | POA: Diagnosis not present

## 2024-08-20 MED ORDER — OXYCODONE HCL 15 MG PO TABS
15.0000 mg | ORAL_TABLET | Freq: Four times a day (QID) | ORAL | 0 refills | Status: AC | PRN
  Filled 2024-08-20: qty 120, 30d supply, fill #0

## 2024-08-22 ENCOUNTER — Other Ambulatory Visit: Payer: Self-pay | Admitting: Internal Medicine

## 2024-08-22 DIAGNOSIS — Z940 Kidney transplant status: Secondary | ICD-10-CM

## 2024-08-26 ENCOUNTER — Other Ambulatory Visit: Payer: Self-pay

## 2024-08-27 ENCOUNTER — Other Ambulatory Visit (HOSPITAL_COMMUNITY): Payer: Self-pay

## 2024-08-27 ENCOUNTER — Other Ambulatory Visit: Payer: Self-pay

## 2024-08-27 ENCOUNTER — Encounter: Payer: Self-pay | Admitting: Internal Medicine

## 2024-08-28 ENCOUNTER — Other Ambulatory Visit: Payer: Self-pay

## 2024-08-29 ENCOUNTER — Other Ambulatory Visit: Payer: Self-pay

## 2024-08-30 ENCOUNTER — Other Ambulatory Visit: Payer: Self-pay

## 2024-08-30 DIAGNOSIS — Z940 Kidney transplant status: Secondary | ICD-10-CM

## 2024-08-30 MED ORDER — MYCOPHENOLATE SODIUM 180 MG PO TBEC: 180.0000 mg | DELAYED_RELEASE_TABLET | Freq: Two times a day (BID) | ORAL | 0 refills | Status: AC

## 2024-09-02 ENCOUNTER — Other Ambulatory Visit: Payer: Self-pay

## 2024-09-03 ENCOUNTER — Other Ambulatory Visit: Payer: Self-pay

## 2024-09-05 ENCOUNTER — Telehealth (INDEPENDENT_AMBULATORY_CARE_PROVIDER_SITE_OTHER): Payer: Self-pay

## 2024-09-05 ENCOUNTER — Ambulatory Visit (INDEPENDENT_AMBULATORY_CARE_PROVIDER_SITE_OTHER): Admitting: Otolaryngology

## 2024-09-05 ENCOUNTER — Telehealth (INDEPENDENT_AMBULATORY_CARE_PROVIDER_SITE_OTHER): Payer: Self-pay | Admitting: Otolaryngology

## 2024-09-05 ENCOUNTER — Encounter (INDEPENDENT_AMBULATORY_CARE_PROVIDER_SITE_OTHER): Payer: Self-pay | Admitting: Otolaryngology

## 2024-09-05 DIAGNOSIS — H938X3 Other specified disorders of ear, bilateral: Secondary | ICD-10-CM

## 2024-09-05 DIAGNOSIS — H9190 Unspecified hearing loss, unspecified ear: Secondary | ICD-10-CM | POA: Diagnosis not present

## 2024-09-05 DIAGNOSIS — R221 Localized swelling, mass and lump, neck: Secondary | ICD-10-CM | POA: Diagnosis not present

## 2024-09-05 NOTE — Telephone Encounter (Signed)
 LVM for patient or patient's wife to call back to schedule appointment with Dr. Tobie in about two weeks - ok to double book.

## 2024-09-05 NOTE — Progress Notes (Signed)
 Dear Dr. Joshua, Here is my assessment for our mutual patient, [name]. Thank you for allowing me the opportunity to care for your patient. Please do not hesitate to contact me should you have any other questions. Sincerely, Dr. Eldora Blanch  Otolaryngology Clinic Note Referring provider: Dr. Joshua HPI:  [name] is a 48 y.o. male kindly referred by Dr. Joshua for evaluation of hearing loss and cerumen impaction and ear issues.  Initial visit: Patient reports: bilateral ear fullness, intermittent hearing loss; noted to have issues with wax, intermittently cleaned. Otherwise no issues with ears. Has tried mineral oil Patient currently denies: ear pain, fullness, vertigo, drainage, tinnitus Patient additionally denies: deep pain in ear canal, eustachian tube symptoms such as popping, crackling, sensitive to pressure changes Patient also denies barotrauma, vestibular suppressant use, ototoxic medication use No prior audio --------------------------------------------------------- 07/10/2024 Returns for follow up. Continued mild ear fullness which has recurred. He does not think his hearing is an issue. Otherwise no vertigo, pain, drainage, infections or tinnitus. We again discussed audiogram but he declined He did have an MRI on 03/25/2024 --------------------------------------------------------- 09/05/2024 The patient gave consent to have this visit done by telemedicine / virtual visit, two identifiers were used to identify patient and spouse. This is also consent for access the chart and treat the patient via this visit. The patient is located in [place] .  I, the provider, am at the office.  We spent 7 minutes together for the visit.  Joined by audio  We discuss US  results and options. He otherwise having mild dysphagia but no pain in the neck or otalgia.  H&N Surgery: no Personal or FHx of bleeding dz or anesthesia difficulty: no  PMHx: PAD, HTN, Strokes, s/p Kidney and  Pancreas Transplant, Carotid Stenosis, T1DM, ESRD  Independent Review of Additional Tests or Records:  Dr. Joshua (Acute Care) 10/25/2023 referral notes - noted hearing loss, b/l cerumen impaction; Dx: cerumen impaction; Rx: ref ENT Labs 12/12/2023 CMP and CBC: Cr 2.07, GFR 39; WBV 4.6, Hgb 14.4, Plt 202 CT Head 10/11/2023 independently interpreted with attention to ears: mastoids and ME well aerated; b/l cerumen impaction; otic capsule and ossicles unremarkable though cuts thick MRI Brain 10/08/2023 independently interpreted and reviewed - no retrocochlear lesion or mastoid effusion MRI Brain 03/25/2024 indepedently interpreted with respect to ears: - no mastoid effusion noted with exception of possible right mastoid tip effusion, no retrocochlear lesions noted; study suboptimal since no IAC dedicated cuts noted CMP 07/02/2024 reviewed: BUN/Cr 26/2.31 US  Neck 07/29/2024 independently inteprreted: right 3x1x1 cm mixed solid cyst neck mass; no other worrisom adenopathy noted at least on US  PMH/Meds/All/SocHx/FamHx/ROS:   Past Medical History:  Diagnosis Date   AMPUTATION, BELOW KNEE, 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   Diabetes mellitus without complication [facility])    no since pancreas transplant   Dialysis patient [facility]) 04/18/2012   [facility]; [place], [place], [place]   Gastroparesis    Gastropathy    GERD (gastroesophageal reflux disease)    Hypertension    MRSA infection    over 10 years ago per patient. in legs     Past Surgical History:  Procedure Laterality Date   AV FISTULA PLACEMENT  08/2011   left upper arm   BELOW KNEE  LEG AMPUTATION  it's been awhile   bilaterally   CATARACT EXTRACTION  ~ 2011   right   COMBINED KIDNEY-PANCREAS TRANSPLANT  2014   ESOPHAGOGASTRODUODENOSCOPY N/A 12/30/2016   Procedure:  ESOPHAGOGASTRODUODENOSCOPY (EGD);  Surgeon: Belvie Just, MD;  Location: [facility] ENDOSCOPY;  Service: Endoscopy;  Laterality: N/A;   OLECRANON BURSECTOMY Right 06/23/2020   Procedure: RIGHT ELBOW EXCISION OLECRANON BURSITIS;  Surgeon: Harden Jerona GAILS, MD;  Location: [facility];  Service: Orthopedics;  Laterality: Right;    Family History  Problem Relation Age of Onset   Hypertension Mother    Diabetes Mother    Kidney disease Mother    Diabetes Maternal Grandmother    Diabetes Paternal Grandmother    Diabetes Other    Hypertension Other    Lung cancer Maternal Aunt    Colon cancer Neg Hx    Esophageal cancer Neg Hx    Rectal cancer Neg Hx    Stomach cancer Neg Hx      Social Connections: Socially Integrated (09/11/2023)   Social Connection and Isolation Panel    Frequency of Communication with Friends and Family: More than three times a week    Frequency of Social Gatherings with Friends and Family: More than three times a week    Attends Religious Services: More than 4 times per year    Active Member of [employer] or Organizations: Yes    Attends Engineer, structural: More than 4 times per year    Marital Status: Married      Current Outpatient Medications:    acetaminophen  (TYLENOL ) 325 MG tablet, Take 1-2 tablets (325-650 mg total) by mouth every 4 (four) hours as needed for mild pain (pain score 1-3)., Disp: , Rfl:    aspirin  EC 81 MG tablet, Take 1 tablet (81 mg total) by mouth daily. Swallow whole., Disp: , Rfl:    atorvastatin  (LIPITOR) 40 MG tablet, Take 1 tablet (40 mg total) by mouth daily., Disp: 90 tablet, Rfl: 1   brimonidine  (ALPHAGAN ) 0.2 % ophthalmic solution, Place 1 drop into both eyes in the morning and at bedtime., Disp: 5 mL, Rfl: 3   carvedilol  (COREG ) 12.5 MG tablet, Take 1 tablet (12.5 mg total) by mouth 2 (two) times daily with a meal., Disp: 180 tablet, Rfl: 1   cycloSPORINE  modified (NEORAL ) 100 MG capsule, Take 100 mg by mouth 2 (two) times  daily. Taken with 50 mg tablet, Disp: , Rfl:    cycloSPORINE  modified (NEORAL ) 50 MG capsule, Take 1 capsule by mouth two times daily, Take along with 100 mg twice a day, Disp: 60 capsule, Rfl: 5   dorzolamide -timolol  (COSOPT ) 2-0.5 % ophthalmic solution, Place 1 drop into both eyes 2 (two) times daily., Disp: 10 mL, Rfl: 2   fluconazole  (DIFLUCAN ) 200 MG tablet, Take 1 tablet (200 mg total) by mouth daily. (Patient not taking: Reported on 04/23/2024), Disp: 14 tablet, Rfl: 0   hydrALAZINE  (APRESOLINE ) 50 MG tablet, TAKE 1 TABLET(50 MG) BY MOUTH THREE TIMES DAILY, Disp: 270 tablet, Rfl: 0   mycophenolate  (MYFORTIC ) 180 MG EC tablet, Take 1 tablet (180 mg total) by mouth 2 (two) times daily., Disp: 180 tablet, Rfl: 0   naloxone  (NARCAN ) nasal spray 4 mg/0.1 mL, Inhale 1 (one) Spray if poorly responding or lips turning blue, Disp: 2 each, Rfl: 2   naloxone  (NARCAN ) nasal spray 4 mg/0.1 mL, Place 1 spray if poorly responding or lips turning blue (Patient not taking: Reported on 07/30/2024), Disp: 2 each, Rfl:  2   nystatin  (MYCOSTATIN ) 100000 UNIT/ML suspension, Swish and swallow 5 mls four times daily for 14 days (Patient not taking: Reported on 07/30/2024), Disp: 280 mL, Rfl: 0   oxyCODONE  (ROXICODONE ) 15 MG immediate release tablet, Take 1 tablet (15 mg total) by mouth in the morning, at noon, in the evening, and at bedtime if needed for pain, Disp: 120 tablet, Rfl: 0   oxyCODONE  (ROXICODONE ) 15 MG immediate release tablet, Take 1 tablet (15 mg total) by mouth 4 (four) times daily as needed for pain. (Patient not taking: Reported on 07/30/2024), Disp: 120 tablet, Rfl: 0   oxyCODONE  (ROXICODONE ) 15 MG immediate release tablet, Take 1 tablet (15 mg total) by mouth 4 (four) times daily as needed., Disp: 120 tablet, Rfl: 0   Oxycodone  HCl 10 MG TABS, Take 0.5-1 tablets (5-10 mg total) by mouth 5 (five) times daily as needed,, Disp: 150 tablet, Rfl: 0   pantoprazole  (PROTONIX ) 40 MG tablet, TAKE 1 TABLET(40 MG) BY  MOUTH TWICE DAILY, Disp: 180 tablet, Rfl: 0   predniSONE  (DELTASONE ) 5 MG tablet, Take 5 mg by mouth daily., Disp: , Rfl:    sulfamethoxazole -trimethoprim  (BACTRIM ) 400-80 MG tablet, Take 1 tablet by mouth every Monday, Wednesday, and Friday., Disp: 12 tablet, Rfl: 0   terazosin  (HYTRIN ) 2 MG capsule, TAKE 2 CAPSULES(4 MG) BY MOUTH AT BEDTIME, Disp: 180 capsule, Rfl: 0   Physical Exam:   There were no vitals taken for this visit.  Salient findings:  No formal physical exam given phone visit  Seprately Identifiable Procedures:  None today     Impression & Plans:  Letrell Attwood is a 48 y.o. male with:  1. Neck mass   2. Subjective hearing loss   3. Sensation of fullness in both ears    Hearing wise: prior MRI again reassuring; wished to defer audio Neck mass; more concerning, especially given immunocompromised state; will get US  Core bx and f/u in 2 weeks in person. If carcinoma, will need further workup. Not good CT candidate due to Kidney dysfunction  See below regarding exact medications prescribed this encounter including dosages and route: No orders of the defined types were placed in this encounter.     Thank you for allowing me the opportunity to care for your patient. Please do not hesitate to contact me should you have any other questions.  Sincerely, Eldora Blanch, MD Otolaryngologist (ENT), [facility] Phone: [phone] Fax: [phone]  09/05/2024, 3:45 PM   MDM: cora - chronic problems, needs further workup; independent interpretation of imaging

## 2024-09-05 NOTE — Progress Notes (Signed)
 [facility] - Clinic Note  09/11/2024   CHIEF COMPLAINT Patient presents for Retina Follow Up  HISTORY OF PRESENT ILLNESS: [name] is a 48 y.o. male who presents to the clinic today for:  HPI     Retina Follow Up   Patient presents with  Diabetic Retinopathy.  In both eyes.  This started 4.  Duration of 4.  Since onset it is stable.  I, the attending physician,  performed the HPI with the patient and updated documentation appropriately.        Comments   4 week retina follow up PDR OU and IVA OU pt is reporting no vision changes noticed he denies any flashes or floaters pt is using dorz/tim bid ou       Last edited by [name], MD on 09/19/2024  2:23 AM.     Pt states   Referring physician: [name], MD [address] [place],  [place] [address]  HISTORICAL INFORMATION:  Selected notes from the MEDICAL RECORD NUMBER Referred by Dr. Hamilton Octavia for PDR OU LEE:  Ocular Hx- previously followed with Dr. 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   dorzolamide -timolol  (COSOPT ) 2-0.5 % ophthalmic solution Place 1 drop into both eyes 2 (two) times daily.   latanoprost  (XALATAN ) 0.005 % ophthalmic solution Place 1 drop into the left eye daily.   No current facility-administered medications for this visit. (Ophthalmic Drugs)   Current Outpatient Medications (Other)  Medication Sig   acetaminophen  (TYLENOL ) 325 MG tablet Take 1-2 tablets (325-650 mg total) by mouth every 4 (four) hours as needed for mild pain (pain score 1-3).   aspirin  EC 81 MG tablet Take 1 tablet (81 mg total) by mouth daily. Swallow  whole.   atorvastatin  (LIPITOR) 40 MG tablet Take 1 tablet (40 mg total) by mouth daily.   carvedilol  (COREG ) 12.5 MG tablet Take 1 tablet (12.5 mg total) by mouth 2 (two) times daily with a meal.   cycloSPORINE  modified (NEORAL ) 100 MG capsule Take 100 mg by mouth 2 (two) times daily. Taken with 50 mg tablet   cycloSPORINE  modified (NEORAL ) 50 MG capsule Take 1 capsule by mouth two times daily, Take along with 100 mg twice a day   hydrALAZINE  (APRESOLINE ) 50 MG tablet TAKE 1 TABLET(50 MG) BY MOUTH THREE TIMES DAILY   mycophenolate  (MYFORTIC ) 180 MG EC tablet Take 1 tablet (180 mg total) by mouth 2 (two) times daily.   naloxone  (NARCAN ) nasal spray 4 mg/0.1 mL Inhale 1 (one) Spray if poorly responding or lips turning blue   naloxone  (NARCAN ) nasal spray 4 mg/0.1 mL Place 1 spray if poorly responding or lips turning blue   nystatin  (MYCOSTATIN ) 100000 UNIT/ML suspension Swish and swallow 5 mls four times daily for 14 days   oxyCODONE  (ROXICODONE ) 15 MG immediate release tablet Take 1 tablet (15 mg total) by mouth in the morning, at noon, in the evening, and at bedtime if needed for pain   oxyCODONE  (ROXICODONE ) 15 MG immediate release tablet Take 1 tablet (15 mg total) by mouth 4 (four) times daily as needed for pain.   oxyCODONE  (ROXICODONE ) 15 MG immediate  release tablet Take 1 tablet (15 mg total) by mouth 4 (four) times daily as needed.   Oxycodone  HCl 10 MG TABS Take 0.5-1 tablets (5-10 mg total) by mouth 5 (five) times daily as needed,   pantoprazole  (PROTONIX ) 40 MG tablet TAKE 1 TABLET(40 MG) BY MOUTH TWICE DAILY   predniSONE  (DELTASONE ) 5 MG tablet Take 5 mg by mouth daily.   sulfamethoxazole -trimethoprim  (BACTRIM ) 400-80 MG tablet Take 1 tablet by mouth every Monday, Wednesday, and Friday.   terazosin  (HYTRIN ) 2 MG capsule TAKE 2 CAPSULES(4 MG) BY MOUTH AT BEDTIME   No current facility-administered medications for this visit. (Other)   REVIEW OF SYSTEMS: ROS   Positive for:  Musculoskeletal, Eyes Last edited by Resa Delon ORN, COT on 09/11/2024  2:07 PM.      ALLERGIES Allergies  Allergen Reactions   Belatacept  Anaphylaxis    hypotensive with altered reaction and required epinephrine .  Needed to be intubated for airway protection.   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   Diabetes mellitus without complication [facility])    no since pancreas transplant   Dialysis patient 04/18/2012   [facility]; [place], [place], [place]   Gastroparesis    Gastropathy    GERD (gastroesophageal reflux disease)    Hypertension    MRSA infection    over 10 years ago per patient. in legs   Past Surgical History:  Procedure Laterality Date   AV FISTULA PLACEMENT  08/2011   left upper arm   BELOW KNEE LEG AMPUTATION  it's been awhile   bilaterally   CATARACT EXTRACTION  ~ 2011   right   COMBINED KIDNEY-PANCREAS TRANSPLANT  2014   ESOPHAGOGASTRODUODENOSCOPY N/A 12/30/2016   Procedure: ESOPHAGOGASTRODUODENOSCOPY (EGD);  Surgeon: Belvie Just, MD;  Location: [facility] ENDOSCOPY;  Service: Endoscopy;  Laterality: N/A;   IR US  GUIDE BX ASP/DRAIN  09/16/2024   OLECRANON BURSECTOMY Right 06/23/2020   Procedure: RIGHT ELBOW EXCISION OLECRANON BURSITIS;  Surgeon: Harden Jerona GAILS, MD;  Location: [facility];  Service: Orthopedics;  Laterality: Right;   FAMILY HISTORY Family History  Problem Relation Age of Onset   Hypertension Mother    Diabetes Mother    Kidney disease Mother    Diabetes Maternal Grandmother    Diabetes Paternal Grandmother    Diabetes Other    Hypertension Other    Lung cancer Maternal Aunt    Colon cancer Neg Hx    Esophageal cancer Neg Hx    Rectal cancer  Neg Hx    Stomach cancer Neg Hx    SOCIAL HISTORY Social History   Tobacco Use   Smoking status: Former    Types: Cigars    Quit date: 07/06/2022    Years since quitting: 2.2   Smokeless tobacco: Never   Tobacco comments:    black and mild  Vaping Use   Vaping status: Never Used  Substance Use Topics   Alcohol use: No   Drug use: Yes    Frequency: 3.0 times per week    Types: Marijuana    Comment: Occasionally       OPHTHALMIC EXAM:  Base Eye  Exam     Visual Acuity (Snellen - Linear)       Right Left   Dist [place] 20/25 -1 20/150 -2   Dist ph [place] NI 20/100 -3         Tonometry (Tonopen, 2:14 PM)       Right Left   Pressure 14 31  1  gtts OS cosopt /brim         Pupils       Pupils Dark Light Shape React APD   Right PERRL 3 2 Round Brisk None   Left PERRL 3 2 Round Brisk None         Visual Fields       Left Right    Full Full         Extraocular Movement       Right Left    Full, Ortho Full, Ortho         Neuro/Psych     Oriented x3: Yes   Mood/Affect: Normal         Dilation     Both eyes: 2.5% Phenylephrine  @ 2:19 PM           Slit Lamp and Fundus Exam     Slit Lamp Exam       Right Left   Lids/Lashes Dermatochalasis - upper lid Dermatochalasis - upper lid   Conjunctiva/Sclera Melanosis Melanosis   Cornea well healed cataract wound, trace PEE, trace tear film debris well healed cataract wound, trace PEE, tear film debris   Anterior Chamber deep and clear deep and clear   Iris Round and moderately dilated, No NVI Round and moderately dilated, No NVI   Lens PC IOL in good position with open PC PC IOL in good position with open PC   Anterior Vitreous White blood stained vit condensations settling inferiorly. Old white blood stained vitreous condensations-improving         Fundus Exam       Right Left   Disc 3+Pallor, Sharp rim, +fibrosis 3+Pallor, Sharp rim, +fibrosis, vascular loops / NVD-regressed   C/D Ratio 0.3  0.4   Macula Flat, Blunted foveal reflex, scattered MA / DBH, central cystic changes, +atrophy Flat, good foveal reflex, scattered fibrosis, scattered MA/DBH   Vessels attenuated, Tortuous, +NV severe attenuation, Tortuous, +NV   Periphery Attached, scattered MA / DBH Attached, scattered MA, diffuse atrophy, good PRP changes 360, room for fill in inferiorly           Refraction     Wearing Rx       Sphere Cylinder Axis Add   Right -2.25 +1.25 127 +1.25   Left -1.50 +1.00 045 +1.25           IMAGING AND PROCEDURES  Imaging and Procedures for 09/11/2024  OCT, Retina - OU - Both Eyes       Right Eye Quality was good. Central Foveal Thickness: 270. Progression has improved. Findings include no SRF, abnormal foveal contour, intraretinal fluid, inner retinal atrophy, vitreomacular adhesion (Diffuse IRA, central cystic changes-slightly improved, +vitreous opacities -- stably improved).   Left Eye Quality was good. Central Foveal Thickness: 229. Progression has been stable. Findings include no SRF, abnormal foveal contour, epiretinal membrane, intraretinal fluid, vitreous traction, preretinal fibrosis (Diffuse atrophy, prominent posterior hyaloid with stable improvement in subhyaloid opacities, focal tractional edema along ST arcades).   Notes *Images captured and stored on drive  Diagnosis / Impression:  OD: Diffuse IRA, central cystic changes- slightly improved, +vitreous opacities -- stably improved  OS: Diffuse atrophy, prominent posterior hyaloid with stable improvement in subhyaloid opacities, focal tractional edema along ST arcades  Clinical management:  See below  Abbreviations: NFP - Normal foveal profile. CME - cystoid macular edema. PED - pigment epithelial detachment. IRF - intraretinal fluid. SRF - subretinal fluid. EZ - ellipsoid zone. ERM - epiretinal membrane. ORA - outer retinal atrophy. ORT - outer retinal tubulation. SRHM - subretinal hyper-reflective material.  IRHM - intraretinal hyper-reflective material      Intravitreal Injection, Pharmacologic Agent - OD - Right Eye       Time Out 09/11/2024. 3:27 PM. Confirmed correct patient, procedure, site, and patient consented.   Anesthesia Topical anesthesia was used. Anesthetic medications included Lidocaine  2%, Proparacaine 0.5%.   Procedure Preparation included 5% betadine to ocular surface, eyelid speculum. A supplied needle was used.   Injection: 1.25 mg Bevacizumab  1.25mg /0.62ml   Route: Intravitreal, Site: Right Eye   NDC: C2662926, Lot: [id number]$ , Expiration date: 09/26/2024   Post-op Post injection exam found visual acuity of at least counting fingers. The patient tolerated the procedure well. There were no complications. The patient received written and verbal post procedure care education.      Intravitreal Injection, Pharmacologic Agent - OS - Left Eye       Time Out 09/11/2024. 3:29 PM. Confirmed correct patient, procedure, site, and patient consented.   Anesthesia Topical anesthesia was used. Anesthetic medications included Lidocaine  2%, Proparacaine 0.5%.   Procedure Preparation included 5% betadine to ocular surface, eyelid speculum. A (32g) needle was used.   Injection: 1.25 mg Bevacizumab  1.25mg /0.44ml   Route: Intravitreal, Site: Left Eye   NDC: C2662926, Lot: [id number], Expiration date: 11/21/2024   Post-op Post injection exam found visual acuity of at least counting fingers. The patient tolerated the procedure well. There were no complications. The patient received written and verbal post procedure care education.            ASSESSMENT/PLAN:   ICD-10-CM   1. Proliferative diabetic retinopathy of both eyes with macular edema associated with type 2 diabetes mellitus (HCC)  E11.3513 OCT, Retina - OU - Both Eyes    Intravitreal Injection, Pharmacologic Agent - OD - Right Eye    Intravitreal Injection, Pharmacologic Agent - OS - Left Eye    Bevacizumab   (AVASTIN ) SOLN 1.25 mg    Bevacizumab  (AVASTIN ) SOLN 1.25 mg    2. Optic neuropathy  H46.9     3. Essential hypertension  I10     4. Hypertensive retinopathy of both eyes  H35.033     5. 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 Pseudophakia, both eyes  Z96.1     7. History of stroke  Z86.73      **Pt reports decreased vision OU since having stroke in Sept 2024**  1. Proliferative diabetic retinopathy, both eyes  - last A1c 6.4 on 04.30.25 -- s/p kidney and pancreas transplant - IVA OU #1 (05.23.25), #2 (06.23.25), #3 (07.21.25), #4 (08.20.25)  - s/p PRP OS 07.29.25 - exam shows blood stained vit condensations OU (OD red; OS white); scattered NV/fibrosis OU - FA (05.23.25) shows OD: Large areas of vascular non-perfusion superior, nasal and inferior midzone, leaking NV superior and inferior to disc; OS: Large areas of vascular non-perfusion superior, nasal and inferior midzone, leaking NV greatest nasal to disc and along temporal arcades -- pt will need PRP OU - OCT shows OD: Diffuse IRA, central cystic changes- slightly improved, +vitreous opacities- stably improved; OS: Diffuse atrophy, prominent posterior  hyaloid with stable improvement in subhyaloid opacities, focal tractional edema along ST arcades - BCVA OD 20/25; OS 20/100 - stable OU - IOP 31 OS today - begin Latanoprost  OS QHS - rx sent to pharmacy on file - recommend IVA OU #5 today, 09.17.25, w/ f/u in 4 wks - pt wishes to proceed with injection - RBA of procedure discussed, questions answered - IVA informed consent obtained and signed, 05.23.25 - see procedure note  - f/u 4 weeks DFE, OCT, possible injxn  2. Bilateral optic neuropathy  - thought to be secondary to tacrolimus  use  - previously managed and monitored by Dr. Merita visit 2022  - exam shows 3+ disc pallor OU  - monitor  3,4. Hypertensive retinopathy OU - discussed importance of tight BP control - monitor  5. Ocular Hypertension OU  - IOP  14, 31 -- steroid response OS  - h/o possible adverse rxn to Brimonidine   - switched to Cosopt  BID OU - add latanoprost  at bedtime OS - monitor  6. Pseudophakia OU  - s/p CE/IOL  - IOL in good position, doing well  - monitor  7. H/o CVA - Sept .2024  - occurred while receiving belatacept  infusion  - MRI Brain 09.23.24   IMPRESSION: 1. Scattered small foci of acute subcortical infarction in the left occipital lobe and posterior left frontal lobe. 2. Faint diffusion signal abnormality along the left parietal lobe may reflect evolving infarct versus seizure related cytotoxic edema. 3. 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  Ophthalmic Meds Ordered this visit:  Meds ordered this encounter  Medications   DISCONTD: brimonidine  (ALPHAGAN ) 0.15 % ophthalmic solution    Sig: Place 1 drop into the left eye every 8 (eight) hours.    Dispense:  15 mL    Refill:  3   latanoprost  (XALATAN ) 0.005 % ophthalmic solution    Sig: Place 1 drop into the left eye daily.    Dispense:  7.5 mL    Refill:  1   Bevacizumab  (AVASTIN ) SOLN 1.25 mg   Bevacizumab  (AVASTIN ) SOLN 1.25 mg     Return in about 4 weeks (around 10/09/2024) for f/u, PDR, DFE, OCT, Possible, IVA, OU.  There are no Patient Instructions on file for this visit.  Explained the diagnoses, plan, and follow up with the patient and they expressed understanding.  Patient expressed understanding of the importance of proper follow up care.   This document serves as a record of services personally performed by Redell JUDITHANN Hans, MD, PhD. It was created on their behalf by Almetta Pesa, an ophthalmic technician. The creation of this record is the provider's dictation and/or activities during the visit.    Electronically signed by: Almetta Pesa, OA, 09/19/24  2:27 AM   This document serves as a record of services personally performed by Redell JUDITHANN Hans, MD, PhD. It was created on their behalf by  Wanda GEANNIE Keens, COT an ophthalmic technician. The creation of this record is the provider's dictation and/or activities during the visit.    Electronically signed by:  Wanda GEANNIE Keens, COT  09/19/24 2:27 AM  Redell JUDITHANN Hans, M.D., Ph.D. Diseases & Surgery of the Retina and Vitreous [facility]  I have reviewed the above documentation for accuracy and completeness, and I agree with the above. Redell JUDITHANN Hans, M.D., Ph.D. 09/19/24 2:27 AM   Abbreviations: M myopia (nearsighted); A astigmatism; H hyperopia (farsighted); P presbyopia; Mrx spectacle prescription;  CTL contact lenses;  OD right eye; OS left eye; OU both eyes  XT exotropia; ET esotropia; PEK punctate epithelial keratitis; PEE punctate epithelial erosions; DES dry eye syndrome; MGD meibomian gland dysfunction; ATs artificial tears; PFAT's preservative free artificial tears; NSC nuclear sclerotic cataract; PSC posterior subcapsular cataract; ERM epi-retinal membrane; PVD posterior vitreous detachment; RD retinal detachment; DM diabetes mellitus; DR diabetic retinopathy; NPDR non-proliferative diabetic retinopathy; PDR proliferative diabetic retinopathy; CSME clinically significant macular edema; DME diabetic macular edema; dbh dot blot hemorrhages; CWS cotton wool spot; POAG primary open angle glaucoma; C/D cup-to-disc ratio; HVF humphrey visual field; GVF goldmann visual field; OCT optical coherence tomography; IOP intraocular pressure; BRVO Branch retinal vein occlusion; CRVO central retinal vein occlusion; CRAO central retinal artery occlusion; BRAO branch retinal artery occlusion; RT retinal tear; SB scleral buckle; PPV pars plana vitrectomy; VH Vitreous hemorrhage; PRP panretinal laser photocoagulation; IVK intravitreal kenalog; VMT vitreomacular traction; MH Macular hole;  NVD neovascularization of the disc; NVE neovascularization elsewhere; AREDS age related eye disease study; ARMD age related macular  degeneration; POAG primary open angle glaucoma; EBMD epithelial/anterior basement membrane dystrophy; ACIOL anterior chamber intraocular lens; IOL intraocular lens; PCIOL posterior chamber intraocular lens; Phaco/IOL phacoemulsification with intraocular lens placement; PRK photorefractive keratectomy; LASIK laser assisted in situ keratomileusis; HTN hypertension; DM diabetes mellitus; COPD chronic obstructive pulmonary disease

## 2024-09-05 NOTE — Patient Instructions (Signed)
 I have ordered an imaging study for you to complete prior to your next visit. Please call [facility] Scheduling at [phone] to schedule your imaging if you have not received a call within 24 hours. If you are unable to complete your imaging study prior to your next scheduled visit please call our office to let us  know.

## 2024-09-06 ENCOUNTER — Telehealth (INDEPENDENT_AMBULATORY_CARE_PROVIDER_SITE_OTHER): Payer: Self-pay | Admitting: Otolaryngology

## 2024-09-06 ENCOUNTER — Encounter (HOSPITAL_COMMUNITY): Payer: Self-pay

## 2024-09-06 ENCOUNTER — Encounter (INDEPENDENT_AMBULATORY_CARE_PROVIDER_SITE_OTHER): Payer: Self-pay

## 2024-09-06 NOTE — Progress Notes (Signed)
 Karalee Wilkie POUR, MD  Mikiyah Glasner Approved for US  guided core biopsy of RIGHT NECK pathologic lymph node.  Concern for mets.  No sedation  HKM       Previous Messages    ----- Message ----- From: Markevious Ehmke Sent: 09/06/2024   8:41 AM EDT To: Karisma Meiser; Ir Procedure Requests Subject: US  core biopsy ( lymph nodes)                  Procedure : US  core biopsy ( lymph nodes)  Reason: neck mass; request biopsy Dx: Neck mass [R22.1 (ICD-10-CM)]    History : US  soft tissue head and neck , MR brain w/o , DG esophagus w/ single , MR angio head w/o  Provider : Tobie Eldora NOVAK, MD  Contact : [phone]

## 2024-09-06 NOTE — Telephone Encounter (Signed)
 LVM & sent MyChart message to call the office and schedule an appointment with Dr. Tobie with-in the next two weeks.  Okay to double-book.

## 2024-09-09 DIAGNOSIS — I639 Cerebral infarction, unspecified: Secondary | ICD-10-CM | POA: Diagnosis not present

## 2024-09-09 DIAGNOSIS — D751 Secondary polycythemia: Secondary | ICD-10-CM | POA: Diagnosis not present

## 2024-09-09 DIAGNOSIS — N183 Chronic kidney disease, stage 3 unspecified: Secondary | ICD-10-CM | POA: Diagnosis not present

## 2024-09-09 DIAGNOSIS — Z9483 Pancreas transplant status: Secondary | ICD-10-CM | POA: Diagnosis not present

## 2024-09-09 DIAGNOSIS — E7800 Pure hypercholesterolemia, unspecified: Secondary | ICD-10-CM | POA: Diagnosis not present

## 2024-09-09 DIAGNOSIS — Z79899 Other long term (current) drug therapy: Secondary | ICD-10-CM | POA: Diagnosis not present

## 2024-09-09 DIAGNOSIS — Z940 Kidney transplant status: Secondary | ICD-10-CM | POA: Diagnosis not present

## 2024-09-09 NOTE — Telephone Encounter (Signed)
 complete

## 2024-09-11 ENCOUNTER — Ambulatory Visit (INDEPENDENT_AMBULATORY_CARE_PROVIDER_SITE_OTHER): Admitting: Ophthalmology

## 2024-09-11 ENCOUNTER — Ambulatory Visit (INDEPENDENT_AMBULATORY_CARE_PROVIDER_SITE_OTHER): Payer: 59

## 2024-09-11 ENCOUNTER — Encounter (INDEPENDENT_AMBULATORY_CARE_PROVIDER_SITE_OTHER): Payer: Self-pay | Admitting: Ophthalmology

## 2024-09-11 VITALS — BP 139/74 | HR 66 | Ht 72.0 in | Wt 244.0 lb

## 2024-09-11 DIAGNOSIS — I10 Essential (primary) hypertension: Secondary | ICD-10-CM

## 2024-09-11 DIAGNOSIS — Z961 Presence of intraocular lens: Secondary | ICD-10-CM

## 2024-09-11 DIAGNOSIS — H40053 Ocular hypertension, bilateral: Secondary | ICD-10-CM

## 2024-09-11 DIAGNOSIS — E113513 Type 2 diabetes mellitus with proliferative diabetic retinopathy with macular edema, bilateral: Secondary | ICD-10-CM | POA: Diagnosis not present

## 2024-09-11 DIAGNOSIS — Z8673 Personal history of transient ischemic attack (TIA), and cerebral infarction without residual deficits: Secondary | ICD-10-CM

## 2024-09-11 DIAGNOSIS — H35033 Hypertensive retinopathy, bilateral: Secondary | ICD-10-CM

## 2024-09-11 DIAGNOSIS — Z0000 Encounter for general adult medical examination without abnormal findings: Secondary | ICD-10-CM | POA: Diagnosis not present

## 2024-09-11 DIAGNOSIS — H469 Unspecified optic neuritis: Secondary | ICD-10-CM

## 2024-09-11 MED ORDER — LATANOPROST 0.005 % OP SOLN: 1.0000 [drp] | Freq: Every day | OPHTHALMIC | 1 refills | Status: AC

## 2024-09-11 MED ORDER — BRIMONIDINE TARTRATE 0.15 % OP SOLN: 1.0000 [drp] | Freq: Three times a day (TID) | OPHTHALMIC | 3 refills | Status: DC

## 2024-09-11 NOTE — Patient Instructions (Addendum)
 [name],  Thank you for taking the time for your Medicare Wellness Visit. I appreciate your continued commitment to your health goals. Please review the care plan we discussed, and feel free to reach out if I can assist you further.  Medicare recommends these wellness visits once per year to help you and your care team stay ahead of potential health issues. These visits are designed to focus on prevention, allowing your provider to concentrate on managing your acute and chronic conditions during your regular appointments.  Please note that Annual Wellness Visits do not include a physical exam. Some assessments may be limited, especially if the visit was conducted virtually. If needed, we may recommend a separate in-person follow-up with your provider.  Ongoing Care Seeing your primary care provider every 3 to 6 months helps us  monitor your health and provide consistent, personalized care.   Referrals If a referral was made during today's visit and you haven't received any updates within two weeks, please contact the referred provider directly to check on the status.  Recommended Screenings:  Health Maintenance  Topic Date Due   Hepatitis B Vaccine (1 of 3 - 19+ 3-dose series) Never done   Colon Cancer Screening  Never done   Flu Shot  07/26/2024   COVID-19 Vaccine (4 - 2025-26 season) 08/26/2024   Hemoglobin A1C  10/24/2024   Eye exam for diabetics  05/15/2025   Medicare Annual Wellness Visit  09/11/2025   DTaP/Tdap/Td vaccine (4 - Td or Tdap) 04/04/2027   Pneumococcal Vaccine  Completed   Hepatitis C Screening  Completed   HIV Screening  Completed   HPV Vaccine  Aged Out   Meningitis B Vaccine  Aged Out       03/25/2024    4:01 PM  Advanced Directives  Does Patient Have a Medical Advance Directive? No  Would patient like information on creating a medical advance directive? No - Patient declined   Advance Care Planning is important because it: Ensures you receive medical  care that aligns with your values, goals, and preferences. Provides guidance to your family and loved ones, reducing the emotional burden of decision-making during critical moments.  Vision: Annual vision screenings are recommended for early detection of glaucoma, cataracts, and diabetic retinopathy. These exams can also reveal signs of chronic conditions such as diabetes and high blood pressure.  Dental: Annual dental screenings help detect early signs of oral cancer, gum disease, and other conditions linked to overall health, including heart disease and diabetes.

## 2024-09-11 NOTE — Progress Notes (Signed)
 Subjective:   [name] is a 48 y.o. who presents for a Medicare Wellness preventive visit.  As a reminder, Annual Wellness Visits don't include a physical exam, and some assessments may be limited, especially if this visit is performed virtually. We may recommend an in-person follow-up visit with your provider if needed.  Visit Complete: In person Persons Participating in Visit: Patient.  AWV Questionnaire: No: Patient Medicare AWV questionnaire was not completed prior to this visit.  Cardiac Risk Factors include: advanced age (>8men, >75 women);dyslipidemia;diabetes mellitus;hypertension;male gender;obesity (BMI >30kg/m2)     Objective:    Today's Vitals   09/11/24 0936  BP: 139/74  Pulse: 66  SpO2: 99%  Weight: 244 lb (110.7 kg)  Height: 6' (1.829 m)   Body mass index is 33.09 kg/m.     09/11/2024    9:58 AM 03/25/2024    4:01 PM 02/28/2024    2:52 PM 02/28/2024    2:06 PM 02/28/2024    1:28 PM 09/29/2023    5:00 PM 09/20/2023    8:50 PM  Advanced Directives  Does Patient Have a Medical Advance Directive? No No No No No No No  Would patient like information on creating a medical advance directive? Yes (MAU/Ambulatory/Procedural Areas - Information given) No - Patient declined No - Patient declined No - Patient declined No - Patient declined No - Patient declined No - Patient declined    Current Medications (verified) Outpatient Encounter Medications as of 09/11/2024  Medication Sig   acetaminophen  (TYLENOL ) 325 MG tablet Take 1-2 tablets (325-650 mg total) by mouth every 4 (four) hours as needed for mild pain (pain score 1-3).   aspirin  EC 81 MG tablet Take 1 tablet (81 mg total) by mouth daily. Swallow whole.   atorvastatin  (LIPITOR) 40 MG tablet Take 1 tablet (40 mg total) by mouth daily.   brimonidine  (ALPHAGAN ) 0.2 % ophthalmic solution Place 1 drop into both eyes in the morning and at bedtime.   carvedilol  (COREG ) 12.5 MG tablet Take 1 tablet (12.5 mg  total) by mouth 2 (two) times daily with a meal.   cycloSPORINE  modified (NEORAL ) 100 MG capsule Take 100 mg by mouth 2 (two) times daily. Taken with 50 mg tablet   cycloSPORINE  modified (NEORAL ) 50 MG capsule Take 1 capsule by mouth two times daily, Take along with 100 mg twice a day   dorzolamide -timolol  (COSOPT ) 2-0.5 % ophthalmic solution Place 1 drop into both eyes 2 (two) times daily.   hydrALAZINE  (APRESOLINE ) 50 MG tablet TAKE 1 TABLET(50 MG) BY MOUTH THREE TIMES DAILY   mycophenolate  (MYFORTIC ) 180 MG EC tablet Take 1 tablet (180 mg total) by mouth 2 (two) times daily.   naloxone  (NARCAN ) nasal spray 4 mg/0.1 mL Inhale 1 (one) Spray if poorly responding or lips turning blue   oxyCODONE  (ROXICODONE ) 15 MG immediate release tablet Take 1 tablet (15 mg total) by mouth in the morning, at noon, in the evening, and at bedtime if needed for pain   oxyCODONE  (ROXICODONE ) 15 MG immediate release tablet Take 1 tablet (15 mg total) by mouth 4 (four) times daily as needed.   Oxycodone  HCl 10 MG TABS Take 0.5-1 tablets (5-10 mg total) by mouth 5 (five) times daily as needed,   pantoprazole  (PROTONIX ) 40 MG tablet TAKE 1 TABLET(40 MG) BY MOUTH TWICE DAILY   predniSONE  (DELTASONE ) 5 MG tablet Take 5 mg by mouth daily.   sulfamethoxazole -trimethoprim  (BACTRIM ) 400-80 MG tablet Take 1 tablet by mouth every Monday, Wednesday,  and Friday.   terazosin  (HYTRIN ) 2 MG capsule TAKE 2 CAPSULES(4 MG) BY MOUTH AT BEDTIME   naloxone  (NARCAN ) nasal spray 4 mg/0.1 mL Place 1 spray if poorly responding or lips turning blue (Patient not taking: Reported on 09/11/2024)   nystatin  (MYCOSTATIN ) 100000 UNIT/ML suspension Swish and swallow 5 mls four times daily for 14 days (Patient not taking: Reported on 09/11/2024)   oxyCODONE  (ROXICODONE ) 15 MG immediate release tablet Take 1 tablet (15 mg total) by mouth 4 (four) times daily as needed for pain. (Patient not taking: Reported on 09/11/2024)   [DISCONTINUED] fluconazole   (DIFLUCAN ) 200 MG tablet Take 1 tablet (200 mg total) by mouth daily. (Patient not taking: Reported on 04/23/2024)   No facility-administered encounter medications on file as of 09/11/2024.    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   Diabetes mellitus without complication [facility])    no since pancreas transplant   Dialysis patient [facility]) 04/18/2012   [facility]; [place], [place], Sat   Gastroparesis    Gastropathy    GERD (gastroesophageal reflux disease)    Hypertension    MRSA infection    over 10 years ago per patient. in legs   Past Surgical History:  Procedure Laterality Date   AV FISTULA PLACEMENT  08/2011   left upper arm   BELOW KNEE LEG AMPUTATION  it's been awhile   bilaterally   CATARACT EXTRACTION  ~ 2011   right   COMBINED KIDNEY-PANCREAS TRANSPLANT  2014   ESOPHAGOGASTRODUODENOSCOPY N/A 12/30/2016   Procedure: ESOPHAGOGASTRODUODENOSCOPY (EGD);  Surgeon: Belvie Just, MD;  Location: [facility] ENDOSCOPY;  Service: Endoscopy;  Laterality: N/A;   OLECRANON BURSECTOMY Right 06/23/2020   Procedure: RIGHT ELBOW EXCISION OLECRANON BURSITIS;  Surgeon: Harden Jerona GAILS, MD;  Location: [facility];  Service: Orthopedics;  Laterality: Right;   Family History  Problem Relation Age of Onset   Hypertension Mother    Diabetes Mother    Kidney disease Mother    Diabetes Maternal Grandmother    Diabetes Paternal Grandmother    Diabetes Other    Hypertension Other    Lung cancer Maternal Aunt    Colon cancer Neg Hx    Esophageal cancer Neg Hx    Rectal cancer Neg Hx    Stomach cancer Neg Hx    Social History   Socioeconomic History   Marital status: Married    Spouse  name: Not on file   Number of children: 1   Years of education: 11   Highest education level: 12th grade  Occupational History   Occupation: Disability  Tobacco Use   Smoking status: Former    Types: Cigars    Quit date: 07/06/2022    Years since quitting: 2.1   Smokeless tobacco: Never   Tobacco comments:    black and mild  Vaping Use   Vaping status: Never Used  Substance and Sexual Activity   Alcohol use: No   Drug use: Yes    Frequency: 3.0 times per week    Types: Marijuana    Comment: Occasionally   Sexual activity: Yes    Partners: Female  Other Topics  Concern   Not on file  Social History Narrative   Married   Recreational Drug use; Smoking   Social Drivers of Health   Financial Resource Strain: Low Risk  (09/11/2024)   Overall Financial Resource Strain (CARDIA)    Difficulty of Paying Living Expenses: Not hard at all  Food Insecurity: No Food Insecurity (09/11/2024)   Hunger Vital Sign    Worried About Running Out of Food in the Last Year: Never true    Ran Out of Food in the Last Year: Never true  Transportation Needs: No Transportation Needs (09/11/2024)   PRAPARE - Administrator, Civil Service (Medical): No    Lack of Transportation (Non-Medical): No  Physical Activity: Sufficiently Active (09/11/2024)   Exercise Vital Sign    Days of Exercise per Week: 7 days    Minutes of Exercise per Session: 30 min  Stress: No Stress Concern Present (09/11/2024)   [employer] of Occupational Health - Occupational Stress Questionnaire    Feeling of Stress: Not at all  Social Connections: Socially Integrated (09/11/2024)   Social Connection and Isolation Panel    Frequency of Communication with Friends and Family: More than three times a week    Frequency of Social Gatherings with Friends and Family: More than three times a week    Attends Religious Services: More than 4 times per year    Active Member of [employer] or Organizations: Yes    Attends Museum/gallery exhibitions officer: More than 4 times per year    Marital Status: Married    Tobacco Counseling Counseling given: Not Answered Tobacco comments: black and mild    Clinical Intake:  Pre-visit preparation completed: Yes  Pain : No/denies pain     BMI - recorded: 33.09 Nutritional Status: BMI > 30  Obese Nutritional Risks: None Diabetes: Yes CBG done?: No Did pt. bring in CBG monitor from home?: No  Lab Results  Component Value Date   HGBA1C 6.4 04/24/2024   HGBA1C 5.8 09/12/2023   HGBA1C 5.9 05/18/2023     How often do you need to have someone help you when you read instructions, pamphlets, or other written materials from your doctor or pharmacy?: 3 - Sometimes (Spouse assists)  Interpreter Needed?: No  Information entered by :: Verdie Saba, CMA   Activities of Daily Living     09/11/2024    9:41 AM 09/29/2023    5:00 PM  In your present state of health, do you have any difficulty performing the following activities:  Hearing? 0 0  Vision? 0 0  Difficulty concentrating or making decisions? 0 0  Walking or climbing stairs? 1   Comment uses cane/wheelchair   Dressing or bathing? 0   Doing errands, shopping? 1 1  Comment Spouse assists   Preparing Food and eating ? N   Using the Toilet? N   In the past six months, have you accidently leaked urine? N   Do you have problems with loss of bowel control? N   Managing your Medications? Y   Comment Spouse assists   Managing your Finances? N   Housekeeping or managing your Housekeeping? Y   Comment Spouse assists     Patient Care Team: Joshua Debby CROME, MD as PCP - General (Internal Medicine) Merceda Lela SAUNDERS, [facility] (Pharmacist) [name], MD as Consulting Physician (Ophthalmology)  I have updated your Care Teams any recent Medical Services you may have received from other providers in the past year.  Assessment:   This is a routine wellness examination for Deshon.  Hearing/Vision  screen Hearing Screening - Comments:: Denies hearing difficulties   Vision Screening - Comments:: Wears rx glasses - up to date with routine eye exams with Dr Valdemar   Goals Addressed               This Visit's Progress     Patient Stated (pt-stated)        Patient stated he plans to continue to walk and exercising       Depression Screen     09/11/2024    9:43 AM 07/30/2024   10:12 AM 04/23/2024    1:34 PM 01/18/2024    2:57 PM 10/31/2023   11:15 AM 09/11/2023    3:36 PM 09/06/2022    4:08 PM  PHQ 2/9 Scores  PHQ - 2 Score 0 0 0 0 0 0 0  PHQ- 9 Score 3    1 0     Fall Risk     09/11/2024    9:42 AM 07/30/2024   10:12 AM 04/23/2024    1:34 PM 01/18/2024    2:56 PM 12/14/2023   10:50 AM  Fall Risk   Falls in the past year? 1 0 1 0 1  Comment   LAST FALL ON 04/22/2024 WITH NO INJURY.    Number falls in past yr: 0 0 1 0 1  Comment 1      Injury with Fall? 0 0 0  0  Risk for fall due to :    Impaired mobility;Impaired balance/gait   Follow up Falls evaluation completed;Falls prevention discussed        MEDICARE RISK AT HOME:  Medicare Risk at Home Any stairs in or around the home?: Yes If so, are there any without handrails?: No Home free of loose throw rugs in walkways, pet beds, electrical cords, etc?: Yes Adequate lighting in your home to reduce risk of falls?: Yes Life alert?: No Use of a cane, walker or w/c?: Yes (cane/wheelchair) Grab bars in the bathroom?: No Shower chair or bench in shower?: Yes Elevated toilet seat or a handicapped toilet?: No  TIMED UP AND GO:  Was the test performed?  No  Cognitive Function: 6CIT completed        09/11/2024    9:46 AM 09/11/2023    3:40 PM 09/06/2022    4:13 PM  6CIT Screen  What Year? 0 points 0 points 0 points  What month? 0 points 0 points 0 points  What time? 0 points 0 points 0 points  Count back from 20 0 points 0 points 0 points  Months in reverse 0 points 0 points 0 points  Repeat phrase 0 points 0 points  0 points  Total Score 0 points 0 points 0 points    Immunizations Immunization History  Administered Date(s) Administered   Fluzone Influenza virus vaccine,trivalent (IIV3), split virus 10/16/2013   Influenza Whole 09/26/2008, 12/07/2009   Influenza, Seasonal, Injecte, Preservative Fre 09/11/2023   Influenza,inj,Quad PF,6+ Mos 09/10/2014, 11/03/2015, 08/26/2016, 08/22/2017, 09/12/2018   Influenza-Unspecified 09/10/2014, 11/03/2015, 08/26/2016, 08/22/2017, 10/26/2021   PFIZER(Purple Top)SARS-COV-2 Vaccination 03/11/2020, 04/01/2020, 08/13/2020   PNEUMOCOCCAL CONJUGATE-20 01/10/2022   Pneumococcal Polysaccharide-23 11/26/1999   Pneumococcal-Unspecified 11/26/1999, 01/27/2012   Td 05/26/1998, 07/26/2010   Tdap 04/03/2017    Screening Tests Health Maintenance  Topic Date Due   Hepatitis B Vaccines 19-59 Average Risk (1 of 3 - 19+ 3-dose series) Never done   Colonoscopy  Never done  Influenza Vaccine  07/26/2024   COVID-19 Vaccine (4 - 2025-26 season) 08/26/2024   HEMOGLOBIN A1C  10/24/2024   OPHTHALMOLOGY EXAM  05/15/2025   Medicare Annual Wellness (AWV)  09/11/2025   DTaP/Tdap/Td (4 - Td or Tdap) 04/04/2027   Pneumococcal Vaccine  Completed   Hepatitis C Screening  Completed   HIV Screening  Completed   HPV VACCINES  Aged Out   Meningococcal B Vaccine  Aged Out    Health Maintenance Items Addressed:  09/11/2024  Pt plans to get Influenza vaccine @ Physical appt w/PCP in 09/2024.  Additional Screening:  Vision Screening: Recommended annual ophthalmology exams for early detection of glaucoma and other disorders of the eye. Is the patient up to date with their annual eye exam?  Yes  Who is the provider or what is the name of the office in which the patient attends annual eye exams? [facility]  Dental Screening: Recommended annual dental exams for proper oral hygiene  Community Resource Referral / Chronic Care Management: CRR required this visit?  No   CCM required  this visit?  No   Plan:    I have personally reviewed and noted the following in the patient's chart:   Medical and social history Use of alcohol, tobacco or illicit drugs  Current medications and supplements including opioid prescriptions. Patient is currently taking opioid prescriptions. Information provided to patient regarding non-opioid alternatives. Patient advised to discuss non-opioid treatment plan with their provider. Functional ability and status Nutritional status Physical activity Advanced directives List of other physicians Hospitalizations, surgeries, and ER visits in previous 12 months Vitals Screenings to include cognitive, depression, and falls Referrals and appointments  In addition, I have reviewed and discussed with patient certain preventive protocols, quality metrics, and best practice recommendations. A written personalized care plan for preventive services as well as general preventive health recommendations were provided to patient.   Verdie CHRISTELLA Saba, CMA   09/11/2024   After Visit Summary: (In Person-Declined) Patient declined AVS at this time.  Notes: Scheduled a Physical/Diabetes f/u w/PCP for 09/2024.

## 2024-09-16 ENCOUNTER — Ambulatory Visit (HOSPITAL_COMMUNITY)
Admission: RE | Admit: 2024-09-16 | Discharge: 2024-09-16 | Disposition: A | Discharge status: Home / Self Care | Source: Ambulatory Visit | Attending: Otolaryngology

## 2024-09-16 DIAGNOSIS — R221 Localized swelling, mass and lump, neck: Secondary | ICD-10-CM | POA: Diagnosis not present

## 2024-09-16 HISTORY — PX: IR US GUIDE BX ASP/DRAIN: IMG2392

## 2024-09-16 MED ORDER — LIDOCAINE-EPINEPHRINE 1 %-1:100000 IJ SOLN
20.0000 mL | Freq: Once | INTRAMUSCULAR | Status: AC
  Administered 2024-09-16: 8 mL via INTRADERMAL

## 2024-09-16 MED ORDER — LIDOCAINE-EPINEPHRINE 1 %-1:100000 IJ SOLN
INTRAMUSCULAR | Status: AC
  Filled 2024-09-16: qty 1

## 2024-09-16 NOTE — Procedures (Signed)
 Vascular and Interventional Radiology Procedure Note  Patient: [name] DOB: 1976-03-23 Medical Record Number: [id number] Note Date/Time: 09/16/24 9:35 AM   Performing Physician: Thom Hall, MD Assistant(s): None  Diagnosis: R neck mass. No DX  Procedure: RIGHT NECK MASS BIOPSY  Anesthesia: Local Anesthetic Complications: None Estimated Blood Loss: Minimal Specimens: Sent for Pathology  Findings:  Successful Ultrasound-guided biopsy of a R neck mass. Core Bx w a total of 1 samples were obtained.  Lesion is tender and Pt could not tolerate additional passes. Hemostasis of the tract was achieved using Manual Pressure.  Plan: Bed rest for 0 hours.  See detailed procedure note with images in PACS. The patient tolerated the procedure well without incident or complication and was returned to Recovery in stable condition.    Thom Hall, MD [facility] Radiology   Pager. [phone] Clinic. [phone]

## 2024-09-17 ENCOUNTER — Ambulatory Visit (INDEPENDENT_AMBULATORY_CARE_PROVIDER_SITE_OTHER): Admitting: Otolaryngology

## 2024-09-18 LAB — SURGICAL PATHOLOGY

## 2024-09-19 ENCOUNTER — Other Ambulatory Visit: Payer: Self-pay

## 2024-09-19 ENCOUNTER — Encounter (INDEPENDENT_AMBULATORY_CARE_PROVIDER_SITE_OTHER): Payer: Self-pay | Admitting: Ophthalmology

## 2024-09-19 DIAGNOSIS — Z9483 Pancreas transplant status: Secondary | ICD-10-CM | POA: Diagnosis not present

## 2024-09-19 DIAGNOSIS — G546 Phantom limb syndrome with pain: Secondary | ICD-10-CM | POA: Diagnosis not present

## 2024-09-19 DIAGNOSIS — S88119A Complete traumatic amputation at level between knee and ankle, unspecified lower leg, initial encounter: Secondary | ICD-10-CM | POA: Diagnosis not present

## 2024-09-19 DIAGNOSIS — N1831 Chronic kidney disease, stage 3a: Secondary | ICD-10-CM | POA: Diagnosis not present

## 2024-09-19 DIAGNOSIS — G894 Chronic pain syndrome: Secondary | ICD-10-CM | POA: Diagnosis not present

## 2024-09-19 DIAGNOSIS — M549 Dorsalgia, unspecified: Secondary | ICD-10-CM | POA: Diagnosis not present

## 2024-09-19 DIAGNOSIS — G890 Central pain syndrome: Secondary | ICD-10-CM | POA: Diagnosis not present

## 2024-09-19 DIAGNOSIS — G8191 Hemiplegia, unspecified affecting right dominant side: Secondary | ICD-10-CM | POA: Diagnosis not present

## 2024-09-19 MED ORDER — BEVACIZUMAB CHEMO INJECTION 1.25MG/0.05ML SYRINGE FOR KALEIDOSCOPE
1.2500 mg | INTRAVITREAL | Status: AC | PRN
  Administered 2024-09-19: 1.25 mg via INTRAVITREAL

## 2024-09-19 MED ORDER — OXYCODONE HCL 15 MG PO TABS
15.0000 mg | ORAL_TABLET | Freq: Four times a day (QID) | ORAL | 0 refills | Status: DC
  Filled 2024-09-19: qty 120, 30d supply, fill #0

## 2024-09-20 ENCOUNTER — Other Ambulatory Visit: Payer: Self-pay

## 2024-09-24 ENCOUNTER — Ambulatory Visit (INDEPENDENT_AMBULATORY_CARE_PROVIDER_SITE_OTHER): Admitting: Otolaryngology

## 2024-09-24 ENCOUNTER — Encounter (INDEPENDENT_AMBULATORY_CARE_PROVIDER_SITE_OTHER): Payer: Self-pay | Admitting: Otolaryngology

## 2024-09-24 VITALS — BP 135/77 | HR 66 | Ht 72.0 in

## 2024-09-24 DIAGNOSIS — D3611 Benign neoplasm of peripheral nerves and autonomic nervous system of face, head, and neck: Secondary | ICD-10-CM | POA: Diagnosis not present

## 2024-09-24 DIAGNOSIS — K224 Dyskinesia of esophagus: Secondary | ICD-10-CM | POA: Diagnosis not present

## 2024-09-24 DIAGNOSIS — K219 Gastro-esophageal reflux disease without esophagitis: Secondary | ICD-10-CM | POA: Diagnosis not present

## 2024-09-24 DIAGNOSIS — R1314 Dysphagia, pharyngoesophageal phase: Secondary | ICD-10-CM

## 2024-09-24 NOTE — Progress Notes (Signed)
 Dear Dr. Joshua, Here is my assessment for our mutual patient, [name]. Thank you for allowing me the opportunity to care for your patient. Please do not hesitate to contact me should you have any other questions. Sincerely, Dr. Eldora Blanch  Otolaryngology Clinic Note Referring provider: Dr. Joshua HPI:  [name] is a 48 y.o. male kindly referred by Dr. Joshua for evaluation of hearing loss and cerumen impaction and ear issues.  Initial visit: Patient reports: bilateral ear fullness, intermittent hearing loss; noted to have issues with wax, intermittently cleaned. Otherwise no issues with ears. Has tried mineral oil Patient currently denies: ear pain, fullness, vertigo, drainage, tinnitus Patient additionally denies: deep pain in ear canal, eustachian tube symptoms such as popping, crackling, sensitive to pressure changes Patient also denies barotrauma, vestibular suppressant use, ototoxic medication use No prior audio --------------------------------------------------------- 07/10/2024 Returns for follow up. Continued mild ear fullness which has recurred. He does not think his hearing is an issue. Otherwise no vertigo, pain, drainage, infections or tinnitus. We again discussed audiogram but he declined He did have an MRI on 03/25/2024 --------------------------------------------------------- 09/05/2024 The patient gave consent to have this visit done by telemedicine / virtual visit, two identifiers were used to identify patient and spouse. This is also consent for access the chart and treat the patient via this visit. The patient is located in  .  I, the provider, am at the office.  We spent 7 minutes together for the visit.  Joined by audio  We discuss US  results and options. He otherwise having mild dysphagia but no pain in the neck or otalgia.  --------------------------------------------------------- 09/24/2024 Returns for follow up. We discussed his path. He  reports some epigastric discomfort and does report that he is having dysphagia, which he says started since he had his stroke. Has had prior swallow evaluations showing dysmotility. No pain in neck, otalgia, trouble breathing, voice changes or other issues from H&N standpoint.   H&N Surgery: no Personal or FHx of bleeding dz or anesthesia difficulty: no  PMHx: PAD, HTN, Strokes, s/p Kidney and Pancreas Transplant, Carotid Stenosis, T1DM, ESRD  Independent Review of Additional Tests or Records:  Dr. Joshua (Acute Care) 10/25/2023 referral notes - noted hearing loss, b/l cerumen impaction; Dx: cerumen impaction; Rx: ref ENT Labs 12/12/2023 CMP and CBC: Cr 2.07, GFR 39; WBV 4.6, Hgb 14.4, Plt 202 CT Head 10/11/2023 independently interpreted with attention to ears: mastoids and ME well aerated; b/l cerumen impaction; otic capsule and ossicles unremarkable though cuts thick MRI Brain 10/08/2023 independently interpreted and reviewed - no retrocochlear lesion or mastoid effusion MRI Brain 03/25/2024 indepedently interpreted with respect to ears: - no mastoid effusion noted with exception of possible right mastoid tip effusion, no retrocochlear lesions noted; study suboptimal since no IAC dedicated cuts noted CMP 07/02/2024 reviewed: BUN/Cr 26/2.31 US  Neck 07/29/2024 independently inteprreted: right 3x1x1 cm mixed solid cyst neck mass; no other worrisom adenopathy noted at least on US  Jessica Zehr (04/18/2024) GI - dysphagia with pills mostly; problem since his stroke; EGD 2024 with esophageal candidiasis; Dx: Dysphagia; Rx: esophagram; treat thrush Path 09/16/2024: Schwannoma 09/20/2023 MBS:   Esophagram 05/06/2024:   PMH/Meds/All/SocHx/FamHx/ROS:   Past Medical History:  Diagnosis Date   AMPUTATION, BELOW KNEE, 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    Diabetes mellitus without complication [facility])    no since pancreas transplant   Dialysis patient 04/18/2012   [facility]; [place], [place], Sat   Gastroparesis    Gastropathy    GERD (gastroesophageal reflux disease)    Hypertension    MRSA infection    over 10 years ago per patient. in legs     Past Surgical History:  Procedure Laterality Date   AV FISTULA PLACEMENT  08/2011   left upper arm   BELOW KNEE LEG AMPUTATION  it's been awhile   bilaterally   CATARACT EXTRACTION  ~ 2011   right   COMBINED KIDNEY-PANCREAS TRANSPLANT  2014   ESOPHAGOGASTRODUODENOSCOPY N/A 12/30/2016   Procedure: ESOPHAGOGASTRODUODENOSCOPY (EGD);  Surgeon: Belvie Just, MD;  Location: [facility] ENDOSCOPY;  Service: Endoscopy;  Laterality: N/A;   IR US  GUIDE BX ASP/DRAIN  09/16/2024   OLECRANON BURSECTOMY Right 06/23/2020   Procedure: RIGHT ELBOW EXCISION OLECRANON BURSITIS;  Surgeon: Harden Jerona GAILS, MD;  Location: [facility];  Service: Orthopedics;  Laterality: Right;    Family History  Problem Relation Age of Onset   Hypertension Mother    Diabetes Mother    Kidney disease Mother    Diabetes Maternal Grandmother    Diabetes Paternal Grandmother    Diabetes Other    Hypertension Other    Lung cancer Maternal Aunt    Colon cancer Neg Hx    Esophageal cancer Neg Hx    Rectal cancer Neg Hx    Stomach cancer Neg Hx      Social Connections: Socially Integrated (09/11/2024)   Social Connection and Isolation Panel    Frequency of Communication with Friends and Family: More than three times a week    Frequency of Social Gatherings with Friends and Family: More than three times a week    Attends Religious Services: More than 4 times per year    Active Member of [employer] or Organizations: Yes    Attends Engineer, structural: More than 4 times per year    Marital Status: Married      Current Outpatient Medications:    acetaminophen  (TYLENOL ) 325 MG tablet, Take 1-2 tablets  (325-650 mg total) by mouth every 4 (four) hours as needed for mild pain (pain score 1-3)., Disp: , Rfl:    aspirin  EC 81 MG tablet, Take 1 tablet (81 mg total) by mouth daily. Swallow whole., Disp: , Rfl:    atorvastatin  (LIPITOR) 40 MG tablet, Take 1 tablet (40 mg total) by mouth daily., Disp: 90 tablet, Rfl: 1   brimonidine  (ALPHAGAN ) 0.2 % ophthalmic solution, Place 1 drop into both eyes in the morning and at bedtime., Disp: 5 mL, Rfl: 3   carvedilol  (COREG ) 12.5 MG tablet, Take 1 tablet (12.5 mg total) by mouth 2 (two) times daily with a meal., Disp: 180 tablet, Rfl: 1   cycloSPORINE  modified (NEORAL ) 100 MG capsule, Take 100 mg by mouth 2 (two) times daily. Taken with 50 mg tablet, Disp: , Rfl:    cycloSPORINE  modified (NEORAL ) 50 MG capsule, Take 1 capsule by mouth two times daily, Take along with 100 mg twice a day, Disp: 60 capsule, Rfl: 5   dorzolamide -timolol  (COSOPT ) 2-0.5 % ophthalmic solution, Place 1 drop into both eyes 2 (two) times daily., Disp: 10 mL, Rfl: 2   hydrALAZINE  (APRESOLINE ) 50 MG tablet, TAKE 1 TABLET(50 MG) BY MOUTH THREE TIMES DAILY, Disp: 270 tablet, Rfl: 0   latanoprost  (XALATAN ) 0.005 % ophthalmic  solution, Place 1 drop into the left eye daily., Disp: 7.5 mL, Rfl: 1   mycophenolate  (MYFORTIC ) 180 MG EC tablet, Take 1 tablet (180 mg total) by mouth 2 (two) times daily., Disp: 180 tablet, Rfl: 0   naloxone  (NARCAN ) nasal spray 4 mg/0.1 mL, Inhale 1 (one) Spray if poorly responding or lips turning blue, Disp: 2 each, Rfl: 2   naloxone  (NARCAN ) nasal spray 4 mg/0.1 mL, Place 1 spray if poorly responding or lips turning blue, Disp: 2 each, Rfl: 2   nystatin  (MYCOSTATIN ) 100000 UNIT/ML suspension, Swish and swallow 5 mls four times daily for 14 days, Disp: 280 mL, Rfl: 0   oxyCODONE  (ROXICODONE ) 15 MG immediate release tablet, Take 1 tablet (15 mg total) by mouth in the morning, at noon, in the evening, and at bedtime if needed for pain, Disp: 120 tablet, Rfl: 0   oxyCODONE   (ROXICODONE ) 15 MG immediate release tablet, Take 1 tablet (15 mg total) by mouth 4 (four) times daily as needed for pain., Disp: 120 tablet, Rfl: 0   oxyCODONE  (ROXICODONE ) 15 MG immediate release tablet, Take 1 tablet (15 mg total) by mouth 4 (four) times daily as needed., Disp: 120 tablet, Rfl: 0   oxyCODONE  (ROXICODONE ) 15 MG immediate release tablet, 1 (one) Tablet by mouth four times daily, if needed for pain, Disp: 120 tablet, Rfl: 0   Oxycodone  HCl 10 MG TABS, Take 0.5-1 tablets (5-10 mg total) by mouth 5 (five) times daily as needed,, Disp: 150 tablet, Rfl: 0   pantoprazole  (PROTONIX ) 40 MG tablet, TAKE 1 TABLET(40 MG) BY MOUTH TWICE DAILY, Disp: 180 tablet, Rfl: 0   predniSONE  (DELTASONE ) 5 MG tablet, Take 5 mg by mouth daily., Disp: , Rfl:    sulfamethoxazole -trimethoprim  (BACTRIM ) 400-80 MG tablet, Take 1 tablet by mouth every Monday, Wednesday, and Friday., Disp: 12 tablet, Rfl: 0   terazosin  (HYTRIN ) 2 MG capsule, TAKE 2 CAPSULES(4 MG) BY MOUTH AT BEDTIME, Disp: 180 capsule, Rfl: 0   Physical Exam:   BP 135/77 (BP Location: Right Arm, Patient Position: Sitting, Cuff Size: Large)   Pulse 66   Ht 6' (1.829 m)   SpO2 97%   BMI 33.09 kg/m   Salient findings:  Facial nerve function intact, tongue protrudes midline, no oral numbness, no palatal numbness, palate elevates symmetrically EAC clear, TM intact with well aerated ME space No stridor; voice strong, no dysphonia; easily tolerates secretions; TFL was indicated to better evaluate the proximal airway, given the patient's history and exam findings, and is detailed below. No large neck masses palpable  Seprately Identifiable Procedures:  Prior to proceeding, R/B/A was discussed and verbal consent obtained for any procedures Procedure Note Pre-procedure diagnosis:  Dysphagia, schwannoma - assess vocal fold function Post-procedure diagnosis: Same Procedure: Transnasal Fiberoptic Laryngoscopy, CPT 31575 - Mod 25 Indication: see  above Complications: None apparent EBL: 0 mL  The procedure was undertaken to further evaluate the patient's complaint above, with mirror exam inadequate for appropriate examination due to gag reflex and poor patient tolerance  Procedure:  Patient was identified as correct patient. Verbal consent was obtained. The nose was sprayed with oxymetazoline and 4% lidocaine . The The flexible laryngoscope was passed through the nose to view the nasal cavity, pharynx (oropharynx, hypopharynx) and larynx.  The larynx was examined at rest and during multiple phonatory tasks. Documentation was obtained and reviewed with patient. The scope was removed. The patient tolerated the procedure well.  Findings: The nasal cavity and nasopharynx did not reveal any masses  or lesions, mucosa appeared to be without obvious lesions. The tongue base, pharyngeal walls, piriform sinuses, vallecula, epiglottis and postcricoid region are normal in appearance; does appear to be sensate over epiglottis and supraglottis. The visualized portion of the subglottis and proximal trachea is widely patent. The vocal folds are mobile bilaterally. There are no lesions on the free edge of the vocal folds nor elsewhere in the larynx worrisome for malignancy.    Electronically signed by: Eldora KATHEE Blanch, MD 09/24/2024 3:21 PM      Impression & Plans:  Kabeer Hoagland is a 48 y.o. male with:  1. Schwannoma of nerve of neck   2. Pharyngoesophageal dysphagia   3. Esophageal dysmotility   4. Laryngopharyngeal reflux (LPR)    Complicated situation given his immunocompromised state and recent stroke. On one hand, his dysphagia could be contributed to by the schwannoma but also could be CVA related or otherwise multifactorial. We discussed options especially given in light of his immunocompromised state. He otherwise is not having any voice issues.   Options here can include surgical management, observation and further workup, v/s potentially  radiation.  Dysphagia is his main complaint and there are some cases where resection could worsen this.  He agrees to further workup for now and will think things over --- will get MRI Neck WO and baseline MBS (had one over a year ago but swallowing since declined)  Will add barrier for what sounds like reflux sx as well; already on PPI  F/u 4-6 weeks by phone  See below regarding exact medications prescribed this encounter including dosages and route: No orders of the defined types were placed in this encounter.     Thank you for allowing me the opportunity to care for your patient. Please do not hesitate to contact me should you have any other questions.  Sincerely, Eldora Blanch, MD Otolaryngologist (ENT), [facility] Phone: [phone] Fax: [phone]  09/24/2024, 3:21 PM   I have personally spent 42 minutes involved in face-to-face and non-face-to-face activities for this patient on the day of the visit.  Professional time spent excludes any procedures performed but includes the following activities, in addition to those noted in the documentation: preparing to see the patient (review of outside documentation and results), performing a medically appropriate examination, extensive counseling, documenting in the electronic health record, independently interpreting results (MBS, Esophagram).

## 2024-09-24 NOTE — Patient Instructions (Signed)
 I have ordered an imaging study for you to complete prior to your next visit. Please call [facility] Scheduling at [phone] to schedule your imaging if you have not received a call within 24 hours. If you are unable to complete your imaging study prior to your next scheduled visit please call our office to let us  know.

## 2024-09-30 ENCOUNTER — Other Ambulatory Visit: Payer: Self-pay

## 2024-10-03 ENCOUNTER — Other Ambulatory Visit (HOSPITAL_COMMUNITY): Payer: Self-pay | Admitting: *Deleted

## 2024-10-03 ENCOUNTER — Telehealth (HOSPITAL_COMMUNITY): Payer: Self-pay | Admitting: *Deleted

## 2024-10-03 DIAGNOSIS — R1310 Dysphagia, unspecified: Secondary | ICD-10-CM

## 2024-10-03 NOTE — Telephone Encounter (Signed)
 Attempted to contact patient to schedule OP MBS. Left VM @ [phone] requesting a call back. RKEEL

## 2024-10-04 ENCOUNTER — Ambulatory Visit (HOSPITAL_COMMUNITY): Attending: Otolaryngology

## 2024-10-07 ENCOUNTER — Other Ambulatory Visit: Payer: Self-pay | Admitting: Internal Medicine

## 2024-10-07 ENCOUNTER — Ambulatory Visit: Payer: Self-pay

## 2024-10-07 DIAGNOSIS — E785 Hyperlipidemia, unspecified: Secondary | ICD-10-CM

## 2024-10-07 DIAGNOSIS — I151 Hypertension secondary to other renal disorders: Secondary | ICD-10-CM

## 2024-10-07 DIAGNOSIS — I10 Essential (primary) hypertension: Secondary | ICD-10-CM

## 2024-10-07 NOTE — Telephone Encounter (Signed)
 FYI Only or Action Required?: Action required by provider: medication refill request.  Patient was last seen in primary care on 04/24/2024 by Joshua Debby CROME, MD.  Called Nurse Triage reporting Hypertension.  Symptoms began a week ago.  Interventions attempted: Nothing.  Symptoms are: unchanged. Wife reports pt. Has been out of hydralazine  x 1-2 weeks and the pharmacy has been trying to get a refill.Pt. needs it today. BP 180/98. No symptoms but he has had stoke last year.  Triage Disposition: See Physician Within 24 Hours  Patient/caregiver understands and will follow disposition?: No, wishes to speak with PCP      Copied from CRM #[id number]. Topic: Clinical - Red Word Triage >> Oct 07, 2024 11:40 AM Eva FALCON wrote: Red Word that prompted transfer to Nurse Triage: wife Emmie calling in, husband has bp of 180/98. No symptoms. Reason for Disposition  Systolic BP >= 180 OR Diastolic >= 110  Answer Assessment - Initial Assessment Questions 1. BLOOD PRESSURE: What is your blood pressure? Did you take at least two measurements 5 minutes apart?     180/98 2. ONSET: When did you take your blood pressure?     today 3. HOW: How did you take your blood pressure? (e.g., automatic home BP monitor, visiting nurse)     Home cuff 4. HISTORY: Do you have a history of high blood pressure?     yes 5. MEDICINES: Are you taking any medicines for blood pressure? Have you missed any doses recently?     yes 6. OTHER SYMPTOMS: Do you have any symptoms? (e.g., blurred vision, chest pain, difficulty breathing, headache, weakness)     no 7. PREGNANCY: Is there any chance you are pregnant? When was your last menstrual period?     N/a  Protocols used: Blood Pressure - High-A-AH

## 2024-10-09 NOTE — Progress Notes (Signed)
 [facility] - Clinic Note  10/11/2024   CHIEF COMPLAINT Patient presents for Retina Follow Up  HISTORY OF PRESENT ILLNESS: [name] is a 48 y.o. male who presents to the clinic today for:  HPI     Retina Follow Up   Patient presents with  Diabetic Retinopathy.  In both eyes.  This started 4 weeks ago.  I, the attending physician,  performed the HPI with the patient and updated documentation appropriately.        Comments   Patient here for 4 weeks retina follow up for PDR OU. Patient states vision coming along. No eye pain.       Last edited by [name], MD on 10/11/2024  3:55 PM.     Pt states VA is stable  Referring physician: [name], MD [address] [place],  [place] [address]  HISTORICAL INFORMATION:  Selected notes from the MEDICAL RECORD NUMBER Referred by Dr. Hamilton Octavia for PDR OU LEE:  Ocular Hx- previously followed with Dr. 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   dorzolamide -timolol  (COSOPT ) 2-0.5 % ophthalmic solution Place 1 drop into both eyes 2 (two) times daily.   latanoprost  (XALATAN ) 0.005 % ophthalmic solution Place 1 drop into the left eye daily.   No current facility-administered medications for this visit. (Ophthalmic Drugs)   Current Outpatient Medications (Other)  Medication Sig   acetaminophen  (TYLENOL ) 325 MG tablet Take 1-2 tablets (325-650 mg total) by mouth every 4 (four) hours as needed for mild pain (pain score 1-3).   aspirin  EC 81 MG tablet Take 1 tablet (81 mg total) by mouth daily. Swallow whole.   cycloSPORINE  modified (NEORAL ) 100 MG capsule Take 100  mg by mouth 2 (two) times daily. Taken with 50 mg tablet   cycloSPORINE  modified (NEORAL ) 50 MG capsule Take 1 capsule by mouth two times daily, Take along with 100 mg twice a day   hydrALAZINE  (APRESOLINE ) 50 MG tablet TAKE 1 TABLET(50 MG) BY MOUTH THREE TIMES DAILY   mycophenolate  (MYFORTIC ) 180 MG EC tablet Take 1 tablet (180 mg total) by mouth 2 (two) times daily.   naloxone  (NARCAN ) nasal spray 4 mg/0.1 mL Inhale 1 (one) Spray if poorly responding or lips turning blue   naloxone  (NARCAN ) nasal spray 4 mg/0.1 mL Place 1 spray if poorly responding or lips turning blue   nystatin  (MYCOSTATIN ) 100000 UNIT/ML suspension Swish and swallow 5 mls four times daily for 14 days   oxyCODONE  (ROXICODONE ) 15 MG immediate release tablet Take 1 tablet (15 mg total) by mouth 4 (four) times daily as needed.   predniSONE  (DELTASONE ) 5 MG tablet Take 5 mg by mouth daily.   sulfamethoxazole -trimethoprim  (BACTRIM ) 400-80 MG tablet Take 1 tablet by mouth every Monday, Wednesday, and Friday.   terazosin  (HYTRIN ) 2 MG capsule TAKE 2 CAPSULES(4 MG) BY MOUTH AT BEDTIME   atorvastatin  (LIPITOR) 40 MG tablet Take 1 tablet (40 mg total) by mouth daily.   carvedilol  (COREG ) 12.5 MG tablet Take 1 tablet (12.5 mg total) by mouth 2 (two) times daily with a meal.   omeprazole  (PRILOSEC) 40 MG capsule Take 40 mg by mouth  daily.   oxyCODONE  (ROXICODONE ) 15 MG immediate release tablet Take 1 tablet (15 mg total) by mouth in the morning, at noon, in the evening, and at bedtime if needed for pain   No current facility-administered medications for this visit. (Other)   REVIEW OF SYSTEMS: ROS   Positive for: Musculoskeletal, Eyes Last edited by Orval Asberry RAMAN, COA on 10/11/2024  2:03 PM.      ALLERGIES Allergies  Allergen Reactions   Belatacept  Anaphylaxis    hypotensive with altered reaction and required epinephrine .  Needed to be intubated for airway protection.   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   Diabetes mellitus without complication [facility])    no since pancreas transplant   Dialysis patient 04/18/2012   [facility]; [place], [place], Sat   Gastroparesis    Gastropathy    GERD (gastroesophageal reflux disease)    Hypertension    MRSA infection    over 10 years ago per patient. in legs   Past Surgical History:  Procedure Laterality Date   AV FISTULA PLACEMENT  08/2011   left upper arm   BELOW KNEE LEG AMPUTATION  it's been awhile   bilaterally   CATARACT EXTRACTION  ~ 2011   right   COMBINED KIDNEY-PANCREAS TRANSPLANT  2014   ESOPHAGOGASTRODUODENOSCOPY N/A 12/30/2016   Procedure: ESOPHAGOGASTRODUODENOSCOPY (EGD);  Surgeon: Belvie Just, MD;  Location: [facility] ENDOSCOPY;  Service: Endoscopy;  Laterality: N/A;   IR US  GUIDE BX ASP/DRAIN  09/16/2024   OLECRANON BURSECTOMY Right 06/23/2020   Procedure: RIGHT ELBOW EXCISION OLECRANON BURSITIS;  Surgeon: Harden Jerona GAILS, MD;  Location: [facility];  Service: Orthopedics;  Laterality: Right;   FAMILY HISTORY Family History  Problem Relation Age of Onset   Hypertension Mother    Diabetes Mother    Kidney disease Mother    Diabetes Maternal Grandmother    Diabetes Paternal Grandmother    Diabetes Other    Hypertension Other    Lung cancer Maternal Aunt    Colon cancer Neg Hx    Esophageal cancer Neg Hx    Rectal cancer Neg Hx    Stomach cancer Neg Hx    SOCIAL HISTORY Social History   Tobacco Use   Smoking status: Every Day    Types: Cigars    Last attempt to quit: 07/06/2022    Years since quitting: 2.2   Smokeless tobacco: Never   Tobacco comments:    black and mild  Vaping Use   Vaping status: Never Used  Substance Use Topics    Alcohol use: No   Drug use: Yes    Frequency: 3.0 times per week    Types: Marijuana    Comment: Occasionally       OPHTHALMIC EXAM:  Base Eye Exam     Visual Acuity (Snellen - Linear)       Right Left   Dist cc 20/25 -2 20/150   Dist ph cc  20/100    Correction: Glasses         Tonometry (Tonopen, 2:02 PM)       Right Left   Pressure 17  19         Pupils       Dark Light Shape React APD   Right 3 2 Round Brisk None   Left 3 2 Round Brisk None         Visual Fields (Counting fingers)       Left Right    Full Full         Extraocular Movement       Right Left    Full, Ortho Full, Ortho         Neuro/Psych     Oriented x3: Yes   Mood/Affect: Normal         Dilation     Both eyes: 1.0% Mydriacyl, 2.5% Phenylephrine  @ 2:01 PM           Slit Lamp and Fundus Exam     Slit Lamp Exam       Right Left   Lids/Lashes Dermatochalasis - upper lid Dermatochalasis - upper lid   Conjunctiva/Sclera Melanosis Melanosis   Cornea well healed cataract wound, trace PEE, trace tear film debris well healed cataract wound, trace PEE, tear film debris   Anterior Chamber deep and clear deep and clear   Iris Round and moderately dilated, No NVI Round and moderately dilated, No NVI   Lens PC IOL in good position with open PC PC IOL in good position with open PC   Anterior Vitreous White blood stained vit condensations settling inferiorly. Old white blood stained vitreous condensations-improving         Fundus Exam       Right Left   Disc 3+Pallor, Sharp rim, +fibrosis 3+Pallor, Sharp rim, +fibrosis, vascular loops / NVD-regressed   C/D Ratio 0.3 0.4   Macula Flat, Good  foveal reflex, scattered MA / DBH, central cystic changes, +atrophy Flat, good foveal reflex, scattered fibrosis, scattered MA/DBH   Vessels attenuated, Tortuous, +NV-regressing severe attenuation, Tortuous, +NV--regressing   Periphery Attached, scattered MA / DBH Attached, scattered  MA, diffuse atrophy, good PRP changes 360, room for fill in inferiorly           Refraction     Wearing Rx       Sphere Cylinder Axis Add   Right -2.25 +1.25 127 +1.25   Left -1.50 +1.00 045 +1.25           IMAGING AND PROCEDURES  Imaging and Procedures for 10/11/2024  OCT, Retina - OU - Both Eyes       Right Eye Quality was good. Central Foveal Thickness: 270. Progression has improved. Findings include no SRF, abnormal foveal contour, intraretinal fluid, inner retinal atrophy, vitreomacular adhesion (Diffuse IRA, central cystic changes-slightly improved, +vitreous opacities -- stably improved).   Left Eye Quality was good. Central Foveal Thickness: 223. Progression has been stable. Findings include no SRF, abnormal foveal contour, epiretinal membrane, intraretinal fluid, vitreous traction, preretinal fibrosis (Diffuse atrophy, prominent posterior hyaloid with stable improvement in subhyaloid opacities, focal tractional edema along ST arcades).   Notes *Images captured and stored on drive  Diagnosis / Impression:  OD: Diffuse IRA, central cystic changes- slightly improved, +vitreous opacities -- stably improved OS: Diffuse atrophy, prominent posterior hyaloid with stable improvement in subhyaloid opacities, focal tractional edema along ST arcades  Clinical management:  See below  Abbreviations: NFP - Normal foveal profile. CME - cystoid macular edema. PED - pigment epithelial detachment. IRF - intraretinal fluid. SRF - subretinal fluid. EZ - ellipsoid zone. ERM - epiretinal membrane. ORA - outer retinal  atrophy. ORT - outer retinal tubulation. SRHM - subretinal hyper-reflective material. IRHM - intraretinal hyper-reflective material      Intravitreal Injection, Pharmacologic Agent - OD - Right Eye       Time Out 10/11/2024. 2:47 PM. Confirmed correct patient, procedure, site, and patient consented.   Anesthesia Topical anesthesia was used. Anesthetic medications  included Lidocaine  2%, Proparacaine 0.5%.   Procedure Preparation included 5% betadine to ocular surface, eyelid speculum. A supplied needle was used.   Injection: 1.25 mg Bevacizumab  1.25mg /0.29ml   Route: Intravitreal, Site: Right Eye   NDC: H525437, Lot: [id number], Expiration date: 10/27/2024   Post-op Post injection exam found visual acuity of at least counting fingers. The patient tolerated the procedure well. There were no complications. The patient received written and verbal post procedure care education.      Intravitreal Injection, Pharmacologic Agent - OS - Left Eye       Time Out 10/11/2024. 2:48 PM. Confirmed correct patient, procedure, site, and patient consented.   Anesthesia Topical anesthesia was used. Anesthetic medications included Lidocaine  2%, Proparacaine 0.5%.   Procedure Preparation included 5% betadine to ocular surface, eyelid speculum. A (32g) needle was used.   Injection: 1.25 mg Bevacizumab  1.25mg /0.79ml   Route: Intravitreal, Site: Left Eye   NDC: H525437, Lot: [id number], Expiration date: 01/04/2025   Post-op Post injection exam found visual acuity of at least counting fingers. The patient tolerated the procedure well. There were no complications. The patient received written and verbal post procedure care education.           ASSESSMENT/PLAN:   ICD-10-CM   1. Proliferative diabetic retinopathy of both eyes with macular edema associated with type 2 diabetes mellitus (HCC)  E11.3513 OCT, Retina - OU - Both Eyes    Intravitreal Injection, Pharmacologic Agent - OD - Right Eye    Intravitreal Injection, Pharmacologic Agent - OS - Left Eye    Bevacizumab  (AVASTIN ) SOLN 1.25 mg    Bevacizumab  (AVASTIN ) SOLN 1.25 mg    2. Optic neuropathy  H46.9     3. Essential hypertension  I10     4. Hypertensive retinopathy of both eyes  H35.033     5. 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 Pseudophakia, both eyes  Z96.1     7. History of  stroke  Z86.73      **Pt reports decreased vision OU since having stroke in Sept 2024**  1. Proliferative diabetic retinopathy, both eyes  - last A1c 6.4 on 04.30.25 -- s/p kidney and pancreas transplant  - IVA OU #1 (05.23.25), #2 (06.23.25), #3 (07.21.25) #4(08.20.29), #5 (09.17.25)  - s/p PRP OS 07.29.25 - exam shows blood stained vit condensations OU (OD red; OS white); scattered NV/fibrosis OU - FA (05.23.25) shows OD: Large areas of vascular non-perfusion superior, nasal and inferior midzone, leaking NV superior and inferior to disc; OS: Large areas of vascular non-perfusion superior, nasal and inferior midzone, leaking NV greatest nasal to disc and along temporal arcades -- pt will need PRP OU - OCT shows OD: Diffuse IRA, central cystic changes- slightly improved, +vitreous opacities -- stably improved; OS: Diffuse atrophy, prominent posterior hyaloid with stable improvement in subhyaloid opacities, focal tractional edema along ST arcadess at 4 wks since last IVA OU - **discussed decreased efficacy / resistance to Avastin  and potential benefit of switching to Eylea**  - BCVA OD 20/25 - stable; OS 20/100 stable - IOP 19 OS today  - recommend IVA OU #6 today, 10.15 .25  w/ follow up ext to 4-5wks - pt wishes to proceed with injection - RBA of procedure discussed, questions answered - IVA informed consent obtained and signed, 05.23.25 - see procedure note  - will check Eylea auth - f/u 4-5 weeks DFE, OCT  2. Bilateral optic neuropathy  - thought to be secondary to tacrolimus  use  - previously managed and monitored by Dr. Merita visit 2022  - exam shows 3+ disc pallor OU  - monitor  3,4. Hypertensive retinopathy OU - discussed importance of tight BP control - monitor  5. Ocular Hypertension OU  - IOP 17,19 --hx of steroid response OS  - h/o possible adverse rxn to Brimonidine   - switched to Cosopt  BID OU - monitor  6. Pseudophakia OU  - s/p CE/IOL  - IOL in good  position, doing well  - monitor  7. H/o CVA - Sept .2024  - occurred while receiving belatacept  infusion  - MRI Brain 09.23.24   IMPRESSION: 1. Scattered small foci of acute subcortical infarction in the left occipital lobe and posterior left frontal lobe. 2. Faint diffusion signal abnormality along the left parietal lobe may reflect evolving infarct versus seizure related cytotoxic edema. 3. 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  Ophthalmic Meds Ordered this visit:  Meds ordered this encounter  Medications   Bevacizumab  (AVASTIN ) SOLN 1.25 mg   Bevacizumab  (AVASTIN ) SOLN 1.25 mg     Return for 4-5 weeks PDR OU, DFE, OCT, Possible Injxn.  There are no Patient Instructions on file for this visit.  This document serves as a record of services personally performed by Redell JUDITHANN Hans, MD, PhD. It was created on their behalf by Delon Newness COT, an ophthalmic technician. The creation of this record is the provider's dictation and/or activities during the visit.    Electronically signed by: Delon Newness COT 10.15.25 12:48 PM  This document serves as a record of services personally performed by Redell JUDITHANN Hans, MD, PhD. It was created on their behalf by Almetta Pesa, an ophthalmic technician. The creation of this record is the provider's dictation and/or activities during the visit.    Electronically signed by: Almetta Pesa, OA, 10/20/24  12:48 PM  Redell JUDITHANN Hans, M.D., Ph.D. Diseases & Surgery of the Retina and Vitreous [facility] 10/11/2024   I have reviewed the above documentation for accuracy and completeness, and I agree with the above. Redell JUDITHANN Hans, M.D., Ph.D. 10/20/24 12:50 PM   Abbreviations: M myopia (nearsighted); A astigmatism; H hyperopia (farsighted); P presbyopia; Mrx spectacle prescription;  CTL contact lenses; OD right eye; OS left eye; OU both eyes  XT exotropia; ET esotropia; PEK  punctate epithelial keratitis; PEE punctate epithelial erosions; DES dry eye syndrome; MGD meibomian gland dysfunction; ATs artificial tears; PFAT's preservative free artificial tears; NSC nuclear sclerotic cataract; PSC posterior subcapsular cataract; ERM epi-retinal membrane; PVD posterior vitreous detachment; RD retinal detachment; DM diabetes mellitus; DR diabetic retinopathy; NPDR non-proliferative diabetic retinopathy; PDR proliferative diabetic retinopathy; CSME clinically significant macular edema; DME diabetic macular edema; dbh dot blot hemorrhages; CWS cotton wool spot; POAG primary open angle glaucoma; C/D cup-to-disc ratio; HVF humphrey visual field; GVF goldmann visual field; OCT optical coherence tomography; IOP intraocular pressure; BRVO Branch retinal vein occlusion; CRVO central retinal vein occlusion; CRAO central retinal artery occlusion; BRAO branch retinal artery occlusion; RT retinal tear; SB scleral buckle; PPV pars plana vitrectomy; VH Vitreous hemorrhage; PRP panretinal laser photocoagulation; IVK intravitreal kenalog; VMT vitreomacular traction; MH  Macular hole;  NVD neovascularization of the disc; NVE neovascularization elsewhere; AREDS age related eye disease study; ARMD age related macular degeneration; POAG primary open angle glaucoma; EBMD epithelial/anterior basement membrane dystrophy; ACIOL anterior chamber intraocular lens; IOL intraocular lens; PCIOL posterior chamber intraocular lens; Phaco/IOL phacoemulsification with intraocular lens placement; PRK photorefractive keratectomy; LASIK laser assisted in situ keratomileusis; HTN hypertension; DM diabetes mellitus; COPD chronic obstructive pulmonary disease

## 2024-10-09 NOTE — Telephone Encounter (Signed)
 Medication has been refilled.

## 2024-10-10 ENCOUNTER — Ambulatory Visit (HOSPITAL_COMMUNITY)

## 2024-10-11 ENCOUNTER — Ambulatory Visit (INDEPENDENT_AMBULATORY_CARE_PROVIDER_SITE_OTHER): Admitting: Ophthalmology

## 2024-10-11 ENCOUNTER — Encounter (INDEPENDENT_AMBULATORY_CARE_PROVIDER_SITE_OTHER): Payer: Self-pay | Admitting: Ophthalmology

## 2024-10-11 DIAGNOSIS — Z8673 Personal history of transient ischemic attack (TIA), and cerebral infarction without residual deficits: Secondary | ICD-10-CM | POA: Diagnosis not present

## 2024-10-11 DIAGNOSIS — H35033 Hypertensive retinopathy, bilateral: Secondary | ICD-10-CM | POA: Diagnosis not present

## 2024-10-11 DIAGNOSIS — E113513 Type 2 diabetes mellitus with proliferative diabetic retinopathy with macular edema, bilateral: Secondary | ICD-10-CM

## 2024-10-11 DIAGNOSIS — H469 Unspecified optic neuritis: Secondary | ICD-10-CM | POA: Diagnosis not present

## 2024-10-11 DIAGNOSIS — I10 Essential (primary) hypertension: Secondary | ICD-10-CM

## 2024-10-11 DIAGNOSIS — Z961 Presence of intraocular lens: Secondary | ICD-10-CM

## 2024-10-11 DIAGNOSIS — H40053 Ocular hypertension, bilateral: Secondary | ICD-10-CM

## 2024-10-11 MED ORDER — BEVACIZUMAB CHEMO INJECTION 1.25MG/0.05ML SYRINGE FOR KALEIDOSCOPE
1.2500 mg | INTRAVITREAL | Status: AC | PRN
  Administered 2024-10-11: 1.25 mg via INTRAVITREAL

## 2024-10-15 ENCOUNTER — Ambulatory Visit (HOSPITAL_BASED_OUTPATIENT_CLINIC_OR_DEPARTMENT_OTHER)
Admission: RE | Admit: 2024-10-15 | Discharge: 2024-10-15 | Disposition: A | Discharge status: Home / Self Care | Source: Ambulatory Visit | Attending: Otolaryngology | Admitting: Otolaryngology

## 2024-10-15 ENCOUNTER — Ambulatory Visit (HOSPITAL_COMMUNITY)
Admission: RE | Admit: 2024-10-15 | Discharge: 2024-10-15 | Disposition: A | Discharge status: Home / Self Care | Source: Ambulatory Visit | Attending: Otolaryngology | Admitting: Otolaryngology

## 2024-10-15 ENCOUNTER — Ambulatory Visit (HOSPITAL_COMMUNITY)
Admission: RE | Admit: 2024-10-15 | Discharge: 2024-10-15 | Disposition: A | Source: Ambulatory Visit | Attending: *Deleted | Admitting: *Deleted

## 2024-10-15 DIAGNOSIS — R1314 Dysphagia, pharyngoesophageal phase: Secondary | ICD-10-CM | POA: Diagnosis not present

## 2024-10-15 DIAGNOSIS — K224 Dyskinesia of esophagus: Secondary | ICD-10-CM | POA: Diagnosis not present

## 2024-10-15 DIAGNOSIS — Z8673 Personal history of transient ischemic attack (TIA), and cerebral infarction without residual deficits: Secondary | ICD-10-CM | POA: Insufficient documentation

## 2024-10-15 DIAGNOSIS — R1310 Dysphagia, unspecified: Secondary | ICD-10-CM

## 2024-10-15 DIAGNOSIS — D3611 Benign neoplasm of peripheral nerves and autonomic nervous system of face, head, and neck: Secondary | ICD-10-CM | POA: Diagnosis not present

## 2024-10-15 NOTE — Evaluation (Signed)
 Modified Barium Swallow Study  Patient Details  Name: [name] MRN: [id number] Date of Birth: 08-May-1976  Today's Date: 10/15/2024  Modified Barium Swallow completed.  Full report located under Chart Review in the Imaging Section.  History of Present Illness 48 yo referred for OP MBS by ENT. 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 Pt describes coughing, occasionally with POs but often throughout the day in the absence of POs and overnight. He usually sleeps flat because he does not like a wedge pillow and he will wake up with regurgitation. PMH includes: GERD, dysphagia, gastroparesis, gastropathy, esophageal dysmotility, CVA (presented to ED 09/17/24, intubated 9/23-9/24, MBS that admission with functional swallow but started on Dys 3 diet due to cognition), impaired mobility and activities of daily living   Clinical Impression Pt's oropharyngeal swallow is [facility] with good efficiency, timing, and safety. Airway protection is complete, as are oral and pharyngeal clearance. He has partially reduced UES opening but duration and clearance are adequate. There was retention of the barium throughout the esophagus (h/o GERD and dysmotility). Suspect that his symptoms may be mroe esophageal in nature. Education including handout provided.  Factors that may increase risk of adverse event in presence of aspiration Noe & Lianne 2021): Respiratory or GI disease  Swallow Evaluation Recommendations Recommendations: PO diet PO Diet Recommendation: Regular;Thin liquids (Level 0) Liquid Administration via: Cup;Straw Medication Administration: Whole meds with liquid (as tolerated - pt says he typically chews them) Supervision: Patient able to self-feed Swallowing strategies  : Slow rate;Small bites/sips;Follow solids with liquids Postural changes: Position pt fully upright for meals;Stay upright 30-60 min after meals Oral care recommendations: Oral care BID  (2x/day)      Leita SAILOR., M.A. CCC-SLP Acute Rehabilitation Services Office: [phone]  Secure chat preferred  10/15/2024,1:29 PM

## 2024-10-16 ENCOUNTER — Encounter: Payer: Self-pay | Admitting: Internal Medicine

## 2024-10-16 ENCOUNTER — Ambulatory Visit: Admitting: Internal Medicine

## 2024-10-16 VITALS — BP 148/78 | HR 65 | Temp 98.5°F | Resp 16 | Ht 72.0 in | Wt 245.0 lb

## 2024-10-16 DIAGNOSIS — Z125 Encounter for screening for malignant neoplasm of prostate: Secondary | ICD-10-CM | POA: Diagnosis not present

## 2024-10-16 DIAGNOSIS — E11319 Type 2 diabetes mellitus with unspecified diabetic retinopathy without macular edema: Secondary | ICD-10-CM

## 2024-10-16 DIAGNOSIS — I151 Hypertension secondary to other renal disorders: Secondary | ICD-10-CM | POA: Diagnosis not present

## 2024-10-16 DIAGNOSIS — E119 Type 2 diabetes mellitus without complications: Secondary | ICD-10-CM

## 2024-10-16 DIAGNOSIS — E785 Hyperlipidemia, unspecified: Secondary | ICD-10-CM

## 2024-10-16 DIAGNOSIS — N186 End stage renal disease: Secondary | ICD-10-CM | POA: Diagnosis not present

## 2024-10-16 DIAGNOSIS — Z23 Encounter for immunization: Secondary | ICD-10-CM

## 2024-10-16 DIAGNOSIS — Z0000 Encounter for general adult medical examination without abnormal findings: Secondary | ICD-10-CM

## 2024-10-16 DIAGNOSIS — Z89512 Acquired absence of left leg below knee: Secondary | ICD-10-CM | POA: Diagnosis not present

## 2024-10-16 DIAGNOSIS — Z89511 Acquired absence of right leg below knee: Secondary | ICD-10-CM

## 2024-10-16 DIAGNOSIS — Z1211 Encounter for screening for malignant neoplasm of colon: Secondary | ICD-10-CM

## 2024-10-16 DIAGNOSIS — Z0001 Encounter for general adult medical examination with abnormal findings: Secondary | ICD-10-CM

## 2024-10-16 LAB — HEPATIC FUNCTION PANEL
ALT: 12 U/L (ref 0–53)
AST: 12 U/L (ref 0–37)
Albumin: 3.8 g/dL (ref 3.5–5.2)
Alkaline Phosphatase: 89 U/L (ref 39–117)
Bilirubin, Direct: 0.2 mg/dL (ref 0.0–0.3)
Total Bilirubin: 0.7 mg/dL (ref 0.2–1.2)
Total Protein: 6.4 g/dL (ref 6.0–8.3)

## 2024-10-16 LAB — LIPID PANEL
Cholesterol: 215 mg/dL — ABNORMAL HIGH (ref 0–200)
HDL: 41.3 mg/dL (ref >39.00)
LDL Cholesterol: 134 mg/dL — ABNORMAL HIGH (ref 0–99)
NonHDL: 173.56
Total CHOL/HDL Ratio: 5
Triglycerides: 198 mg/dL — ABNORMAL HIGH (ref 0.0–149.0)
VLDL: 39.6 mg/dL (ref 0.0–40.0)

## 2024-10-16 LAB — TSH: TSH: 0.63 u[IU]/mL (ref 0.35–5.50)

## 2024-10-16 LAB — PSA: PSA: 1.64 ng/mL (ref 0.10–4.00)

## 2024-10-16 MED ORDER — COVID-19 MRNA VAC-TRIS(PFIZER) 30 MCG/0.3ML IM SUSY: 0.3000 mL | PREFILLED_SYRINGE | Freq: Once | INTRAMUSCULAR | 0 refills | Status: AC

## 2024-10-16 MED ORDER — ATORVASTATIN CALCIUM 40 MG PO TABS
40.0000 mg | ORAL_TABLET | Freq: Every day | ORAL | 1 refills | Status: DC
Start: 2024-10-16 — End: 2024-10-21

## 2024-10-16 MED ORDER — CARVEDILOL 12.5 MG PO TABS: 12.5000 mg | ORAL_TABLET | Freq: Two times a day (BID) | ORAL | 1 refills | Status: DC

## 2024-10-16 NOTE — Patient Instructions (Signed)
 Health Maintenance, Male  Adopting a healthy lifestyle and getting preventive care are important in promoting health and wellness. Ask your health care provider about:  The right schedule for you to have regular tests and exams.  Things you can do on your own to prevent diseases and keep yourself healthy.  What should I know about diet, weight, and exercise?  Eat a healthy diet    Eat a diet that includes plenty of vegetables, fruits, low-fat dairy products, and lean protein.  Do not eat a lot of foods that are high in solid fats, added sugars, or sodium.  Maintain a healthy weight  Body mass index (BMI) is a measurement that can be used to identify possible weight problems. It estimates body fat based on height and weight. Your health care provider can help determine your BMI and help you achieve or maintain a healthy weight.  Get regular exercise  Get regular exercise. This is one of the most important things you can do for your health. Most adults should:  Exercise for at least 150 minutes each week. The exercise should increase your heart rate and make you sweat (moderate-intensity exercise).  Do strengthening exercises at least twice a week. This is in addition to the moderate-intensity exercise.  Spend less time sitting. Even light physical activity can be beneficial.  Watch cholesterol and blood lipids  Have your blood tested for lipids and cholesterol at 50 years of age, then have this test every 5 years.  You may need to have your cholesterol levels checked more often if:  Your lipid or cholesterol levels are high.  You are older than 50 years of age.  You are at high risk for heart disease.  What should I know about cancer screening?  Many types of cancers can be detected early and may often be prevented. Depending on your health history and family history, you may need to have cancer screening at various ages. This may include screening for:  Colorectal cancer.  Prostate cancer.  Skin cancer.  Lung  cancer.  What should I know about heart disease, diabetes, and high blood pressure?  Blood pressure and heart disease  High blood pressure causes heart disease and increases the risk of stroke. This is more likely to develop in people who have high blood pressure readings or are overweight.  Talk with your health care provider about your target blood pressure readings.  Have your blood pressure checked:  Every 3-5 years if you are 24-52 years of age.  Every year if you are 3 years old or older.  If you are between the ages of 60 and 72 and are a current or former smoker, ask your health care provider if you should have a one-time screening for abdominal aortic aneurysm (AAA).  Diabetes  Have regular diabetes screenings. This checks your fasting blood sugar level. Have the screening done:  Once every three years after age 66 if you are at a normal weight and have a low risk for diabetes.  More often and at a younger age if you are overweight or have a high risk for diabetes.  What should I know about preventing infection?  Hepatitis B  If you have a higher risk for hepatitis B, you should be screened for this virus. Talk with your health care provider to find out if you are at risk for hepatitis B infection.  Hepatitis C  Blood testing is recommended for:  Everyone born from [birth year] through 1967.  Anyone  with known risk factors for hepatitis C.  Sexually transmitted infections (STIs)  You should be screened each year for STIs, including gonorrhea and chlamydia, if:  You are sexually active and are younger than 50 years of age.  You are older than 50 years of age and your health care provider tells you that you are at risk for this type of infection.  Your sexual activity has changed since you were last screened, and you are at increased risk for chlamydia or gonorrhea. Ask your health care provider if you are at risk.  Ask your health care provider about whether you are at high risk for HIV. Your health care provider  may recommend a prescription medicine to help prevent HIV infection. If you choose to take medicine to prevent HIV, you should first get tested for HIV. You should then be tested every 3 months for as long as you are taking the medicine.  Follow these instructions at home:  Alcohol use  Do not drink alcohol if your health care provider tells you not to drink.  If you drink alcohol:  Limit how much you have to 0-2 drinks a day.  Know how much alcohol is in your drink. In the [place] one drink equals one 12 oz bottle of beer (355 mL), one 5 oz glass of wine (148 mL), or one 1 oz glass of hard liquor (44 mL).  Lifestyle  Do not use any products that contain nicotine or tobacco. These products include cigarettes, chewing tobacco, and vaping devices, such as e-cigarettes. If you need help quitting, ask your health care provider.  Do not use street drugs.  Do not share needles.  Ask your health care provider for help if you need support or information about quitting drugs.  General instructions  Schedule regular health, dental, and eye exams.  Stay current with your vaccines.  Tell your health care provider if:  You often feel depressed.  You have ever been abused or do not feel safe at home.  Summary  Adopting a healthy lifestyle and getting preventive care are important in promoting health and wellness.  Follow your health care provider's instructions about healthy diet, exercising, and getting tested or screened for diseases.  Follow your health care provider's instructions on monitoring your cholesterol and blood pressure.  This information is not intended to replace advice given to you by your health care provider. Make sure you discuss any questions you have with your health care provider.  Document Revised: 05/03/2021 Document Reviewed: 05/03/2021  Elsevier Patient Education  2024 [employer].

## 2024-10-16 NOTE — Progress Notes (Signed)
 "  Subjective:  Patient ID: [name], male    DOB: 05/31/1976  Age: 48 y.o. MRN: [id number]  CC: Annual Exam, Hypertension, Hyperlipidemia, and Diabetes   HPI Oisin Kelten Enochs presents for a CPX and f/up ----  Discussed the use of AI scribe software for clinical note transcription with the patient, who gave verbal consent to proceed.  History of Present Illness [name] is a 48 year old male with diabetic retinopathy and hypertension who presents for follow-up.  He has experienced significant improvement in vision following treatment for diabetic retinopathy. He receives injections in both eyes approximately every four weeks and has undergone laser treatment. Due to improvement, the interval between eye appointments has been extended to every six weeks. He was last seen by the eye doctor last week.  He has a history of hypertension, which was previously well-controlled with medication, maintaining blood pressure in the 130s. A lapse in medication availability led to an increase in blood pressure, reaching the 150s to 160s. He has since resumed his medication, hydralazine . He did not have his blood pressure medication for about a week, raising concern about the risk of another stroke.  No chest pain, shortness of breath, dizziness, lightheadedness, headache, blurred vision, excessive thirst, or urination.  He estimates his weight to be 245 pounds, based on a recent appointment with his kidney doctor. He sees the kidney doctor approximately every four months, and lab work is typically done before each appointment. The last lab work was conducted in late summer.  He has not yet received a flu shot this season. He has not had a COVID vaccine in a couple of years.     Outpatient Medications Prior to Visit  Medication Sig Dispense Refill   acetaminophen  (TYLENOL ) 325 MG tablet Take 1-2 tablets (325-650 mg total) by mouth every 4 (four) hours as needed for  mild pain (pain score 1-3).     aspirin  EC 81 MG tablet Take 1 tablet (81 mg total) by mouth daily. Swallow whole.     brimonidine  (ALPHAGAN ) 0.2 % ophthalmic solution Place 1 drop into both eyes in the morning and at bedtime. 5 mL 3   cycloSPORINE  modified (NEORAL ) 100 MG capsule Take 100 mg by mouth 2 (two) times daily. Taken with 50 mg tablet     cycloSPORINE  modified (NEORAL ) 50 MG capsule Take 1 capsule by mouth two times daily, Take along with 100 mg twice a day 60 capsule 5   dorzolamide -timolol  (COSOPT ) 2-0.5 % ophthalmic solution Place 1 drop into both eyes 2 (two) times daily. 10 mL 2   hydrALAZINE  (APRESOLINE ) 50 MG tablet TAKE 1 TABLET(50 MG) BY MOUTH THREE TIMES DAILY 270 tablet 0   latanoprost  (XALATAN ) 0.005 % ophthalmic solution Place 1 drop into the left eye daily. 7.5 mL 1   mycophenolate  (MYFORTIC ) 180 MG EC tablet Take 1 tablet (180 mg total) by mouth 2 (two) times daily. 180 tablet 0   naloxone  (NARCAN ) nasal spray 4 mg/0.1 mL Inhale 1 (one) Spray if poorly responding or lips turning blue 2 each 2   naloxone  (NARCAN ) nasal spray 4 mg/0.1 mL Place 1 spray if poorly responding or lips turning blue 2 each 2   nystatin  (MYCOSTATIN ) 100000 UNIT/ML suspension Swish and swallow 5 mls four times daily for 14 days 280 mL 0   omeprazole  (PRILOSEC) 40 MG capsule Take 40 mg by mouth daily.     oxyCODONE  (ROXICODONE ) 15 MG immediate release tablet Take 1 tablet (15  mg total) by mouth 4 (four) times daily as needed. 120 tablet 0   predniSONE  (DELTASONE ) 5 MG tablet Take 5 mg by mouth daily.     sulfamethoxazole -trimethoprim  (BACTRIM ) 400-80 MG tablet Take 1 tablet by mouth every Monday, Wednesday, and Friday. 12 tablet 0   terazosin  (HYTRIN ) 2 MG capsule TAKE 2 CAPSULES(4 MG) BY MOUTH AT BEDTIME 180 capsule 0   atorvastatin  (LIPITOR) 40 MG tablet Take 1 tablet (40 mg total) by mouth daily. 90 tablet 1   carvedilol  (COREG ) 12.5 MG tablet Take 1 tablet (12.5 mg total) by mouth 2 (two) times  daily with a meal. 180 tablet 1   oxyCODONE  (ROXICODONE ) 15 MG immediate release tablet Take 1 tablet (15 mg total) by mouth in the morning, at noon, in the evening, and at bedtime if needed for pain 120 tablet 0   oxyCODONE  (ROXICODONE ) 15 MG immediate release tablet Take 1 tablet (15 mg total) by mouth 4 (four) times daily as needed for pain. 120 tablet 0   oxyCODONE  (ROXICODONE ) 15 MG immediate release tablet 1 (one) Tablet by mouth four times daily, if needed for pain 120 tablet 0   Oxycodone  HCl 10 MG TABS Take 0.5-1 tablets (5-10 mg total) by mouth 5 (five) times daily as needed, 150 tablet 0   pantoprazole  (PROTONIX ) 40 MG tablet TAKE 1 TABLET(40 MG) BY MOUTH TWICE DAILY 180 tablet 0   No facility-administered medications prior to visit.    ROS Review of Systems  Constitutional:  Negative for appetite change, chills, diaphoresis, fatigue and fever.  HENT: Negative.  Negative for sore throat and trouble swallowing.   Eyes: Negative.   Respiratory:  Negative for cough, chest tightness, shortness of breath and wheezing.   Cardiovascular:  Negative for chest pain, palpitations and leg swelling.  Gastrointestinal: Negative.  Negative for abdominal pain, blood in stool, constipation, diarrhea, nausea and vomiting.  Endocrine: Negative.   Genitourinary: Negative.  Negative for difficulty urinating and dysuria.  Musculoskeletal:  Positive for arthralgias. Negative for myalgias and neck stiffness.  Skin: Negative.   Neurological:  Negative for dizziness, weakness and light-headedness.  Hematological:  Negative for adenopathy. Does not bruise/bleed easily.  Psychiatric/Behavioral: Negative.      Objective:  BP (!) 148/78 (BP Location: Right Arm, Patient Position: Sitting, Cuff Size: Normal)   Pulse 65   Temp 98.5 F (36.9 C) (Oral)   Resp 16   Ht 6' (1.829 m)   Wt 245 lb (111.1 kg)   SpO2 97%   BMI 33.23 kg/m   BP Readings from Last 3 Encounters:  10/16/24 (!) 148/78  09/24/24  135/77  09/11/24 139/74    Wt Readings from Last 3 Encounters:  10/16/24 245 lb (111.1 kg)  09/11/24 244 lb (110.7 kg)  04/24/24 217 lb (98.4 kg)    Physical Exam Vitals reviewed.  Constitutional:      Appearance: Normal appearance.  HENT:     Nose: Nose normal.     Mouth/Throat:     Mouth: Mucous membranes are moist.  Eyes:     General: No scleral icterus.    Conjunctiva/sclera: Conjunctivae normal.  Cardiovascular:     Rate and Rhythm: Normal rate and regular rhythm.     Heart sounds: No murmur heard.    No friction rub. No gallop.  Pulmonary:     Effort: Pulmonary effort is normal.     Breath sounds: No stridor. No wheezing, rhonchi or rales.  Abdominal:     General: Abdomen is  flat. Bowel sounds are normal.     Palpations: There is no mass.     Tenderness: There is no abdominal tenderness. There is no guarding.     Hernia: No hernia is present.  Musculoskeletal:        General: Normal range of motion.     Cervical back: Neck supple.     Right lower leg: No edema.     Left lower leg: No edema.  Lymphadenopathy:     Cervical: No cervical adenopathy.  Skin:    General: Skin is warm and dry.  Neurological:     Mental Status: He is alert. Mental status is at baseline.  Psychiatric:        Mood and Affect: Mood normal.        Behavior: Behavior normal.     Lab Results  Component Value Date   WBC 6.0 03/25/2024   HGB 14.9 03/25/2024   HCT 47.1 03/25/2024   PLT 207 03/25/2024   GLUCOSE 104 (H) 04/24/2024   CHOL 215 (H) 10/16/2024   TRIG 198.0 (H) 10/16/2024   HDL 41.30 10/16/2024   LDLDIRECT 116 (H) 01/21/2011   LDLCALC 134 (H) 10/16/2024   ALT 12 10/16/2024   AST 12 10/16/2024   NA 137 04/24/2024   K 4.4 04/24/2024   CL 106 04/24/2024   CREATININE 2.19 (H) 04/24/2024   BUN 27 (H) 04/24/2024   CO2 25 04/24/2024   TSH 0.63 10/16/2024   PSA 1.64 10/16/2024   INR 1.1 09/18/2023   HGBA1C 6.5 10/16/2024   MICROALBUR <0.7 04/24/2024    DG SWALLOW  FUNC OP MEDICARE SPEECH PATH Result Date: 10/15/2024 Table formatting from the original result was not included. Modified Barium Swallow Study Patient Details Name: Jeovany Huitron MRN: [id number] Date of Birth: July 08, 1976 Today's Date: 10/15/2024 HPI/PMH: HPI: 48 yo referred for OP MBS by ENT. 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 Pt describes coughing, occasionally with POs but often throughout the day in the absence of POs and overnight. He usually sleeps flat because he does not like a wedge pillow and he will wake up with regurgitation. PMH includes: GERD, dysphagia, gastroparesis, gastropathy, esophageal dysmotility, CVA (presented to ED 09/17/24, intubated 9/23-9/24, MBS that admission with functional swallow but started on Dys 3 diet due to cognition), impaired mobility and activities of daily living Clinical Impression: Pt's oropharyngeal swallow is [facility] with good efficiency, timing, and safety. Airway protection is complete, as are oral and pharyngeal clearance. He has partially reduced UES opening but duration and clearance are adequate. There was retention of the barium throughout the esophagus (h/o GERD and dysmotility). Suspect that his symptoms may be mroe esophageal in nature. Education including handout provided. Factors that may increase risk of adverse event in presence of aspiration Noe & Lianne 2021): Factors that may increase risk of adverse event in presence of aspiration Noe & Lianne 2021): Respiratory or GI disease Recommendations/Plan: Swallowing Evaluation Recommendations Swallowing Evaluation Recommendations Recommendations: PO diet PO Diet Recommendation: Regular; Thin liquids (Level 0) Liquid Administration via: Cup; Straw Medication Administration: Whole meds with liquid (as tolerated - pt says he typically chews them) Supervision: Patient able to self-feed Swallowing strategies  : Slow rate; Small bites/sips; Follow solids with liquids Postural  changes: Position pt fully upright for meals; Stay upright 30-60 min after meals Oral care recommendations: Oral care BID (2x/day) Treatment Plan Treatment Plan Treatment recommendations: No treatment recommended at this time Follow-up recommendations: No SLP follow up Recommendations Recommendations  for follow up therapy are one component of a multi-disciplinary discharge planning process, led by the attending physician.  Recommendations may be updated based on patient status, additional functional criteria and insurance authorization. Assessment: Orofacial Exam: Orofacial Exam Oral Cavity: Oral Hygiene: WFL Oral Cavity - Dentition: Dentures, top; Dentures, bottom Orofacial Anatomy: WFL Oral Motor/Sensory Function: WFL Anatomy: Anatomy: WFL Boluses Administered: Boluses Administered Boluses Administered: Thin liquids (Level 0); Mildly thick liquids (Level 2, nectar thick); Moderately thick liquids (Level 3, honey thick); Puree; Solid  Oral Impairment Domain: Oral Impairment Domain Lip Closure: No labial escape Tongue control during bolus hold: Cohesive bolus between tongue to palatal seal Bolus preparation/mastication: Timely and efficient chewing and mashing Bolus transport/lingual motion: Brisk tongue motion Oral residue: Complete oral clearance Location of oral residue : N/A Initiation of pharyngeal swallow : Valleculae  Pharyngeal Impairment Domain: Pharyngeal Impairment Domain Soft palate elevation: No bolus between soft palate (SP)/pharyngeal wall (PW) Laryngeal elevation: Complete superior movement of thyroid  cartilage with complete approximation of arytenoids to epiglottic petiole Anterior hyoid excursion: Complete anterior movement Epiglottic movement: Complete inversion Laryngeal vestibule closure: Complete, no air/contrast in laryngeal vestibule Pharyngeal stripping wave : Present - complete Pharyngeal contraction (A/P view only): N/A Pharyngoesophageal segment opening: Partial distention/partial  duration, partial obstruction of flow Tongue base retraction: No contrast between tongue base and posterior pharyngeal wall (PPW) Pharyngeal residue: Complete pharyngeal clearance Location of pharyngeal residue: N/A  Esophageal Impairment Domain: Esophageal Impairment Domain Esophageal clearance upright position: Esophageal retention Pill: Pill Consistency administered: -- (pt masticated the pill (says he masticates them at home)) Penetration/Aspiration Scale Score: Penetration/Aspiration Scale Score 1.  Material does not enter airway: Thin liquids (Level 0); Mildly thick liquids (Level 2, nectar thick); Moderately thick liquids (Level 3, honey thick); Puree; Solid; Pill Compensatory Strategies: No data recorded  General Information: Caregiver present: Yes (wife)  Diet Prior to this Study: Regular; Thin liquids (Level 0)   No data recorded  Respiratory Status: WFL   Supplemental O2: None (Room air)   History of Recent Intubation: No  Behavior/Cognition: Alert; Cooperative; Pleasant mood Self-Feeding Abilities: Able to self-feed Baseline vocal quality/speech: Normal Volitional Cough: Able to elicit Volitional Swallow: Able to elicit Exam Limitations: No limitations Goal Planning: No data recorded No data recorded No data recorded Patient/Family Stated Goal: to assess swallowing Consulted and agree with results and recommendations: Patient; Family member/caregiver Pain: Pain Assessment Pain Assessment: Faces Faces Pain Scale: 0 End of Session: Start Time:SLP Start Time (ACUTE ONLY): 1121 Stop Time: SLP Stop Time (ACUTE ONLY): 1142 Time Calculation:SLP Time Calculation (min) (ACUTE ONLY): 21 min Charges: SLP Evaluations $ SLP Speech Visit: 1 Visit SLP Evaluations $Outpatient MBS Swallow: 1 Procedure SLP visit diagnosis: SLP Visit Diagnosis: Dysphagia, unspecified (R13.10) Past Medical History: Past Medical History: Diagnosis Date  AMPUTATION, BELOW KNEE, HX OF 04/08/2008  Arthritis   I think I do; just in my fingers &  my hands  Blood transfusion   Cataract   Chronic pain   Depression   Patient states he has never been depressed.  Diabetes mellitus without complication [facility])   no since pancreas transplant  Dialysis patient 04/18/2012  [facility]; [place], [place], Sat  Gastroparesis   Gastropathy   GERD (gastroesophageal reflux disease)   Hypertension   MRSA infection   over 10 years ago per patient. in legs Past Surgical History: Past Surgical History: Procedure Laterality Date  AV FISTULA PLACEMENT  08/2011  left upper arm  BELOW KNEE LEG AMPUTATION  it's been  awhile  bilaterally  CATARACT EXTRACTION  ~ 2011  right  COMBINED KIDNEY-PANCREAS TRANSPLANT  2014  ESOPHAGOGASTRODUODENOSCOPY N/A 12/30/2016  Procedure: ESOPHAGOGASTRODUODENOSCOPY (EGD);  Surgeon: Belvie Just, MD;  Location: [facility] ENDOSCOPY;  Service: Endoscopy;  Laterality: N/A;  IR US  GUIDE BX ASP/DRAIN  09/16/2024  OLECRANON BURSECTOMY Right 06/23/2020  Procedure: RIGHT ELBOW EXCISION OLECRANON BURSITIS;  Surgeon: Harden Jerona GAILS, MD;  Location: [facility];  Service: Orthopedics;  Laterality: Right; Leita SAILOR., M.A. CCC-SLP Acute Rehabilitation Services Office: [phone] Secure chat preferred 10/15/2024, 1:31 PM   Assessment & Plan:  Screening for prostate cancer -     PSA; Future  Screen for colon cancer -     Ambulatory referral to Gastroenterology  ESRD (end stage renal disease) (HCC) -     COVID-19 mRNA Vac-TriS(Pfizer); Inject 0.3 mLs into the muscle once for 1 dose.  Dispense: 0.3 mL; Refill: 0  Dyslipidemia, goal LDL below 100- He has not achieved his LDL goal. Will restart the statin. -     Lipid panel; Future -     Hepatic function panel; Future -     TSH; Future -     Atorvastatin  Calcium ; Take 1 tablet (40 mg total) by mouth daily.  Dispense: 90 tablet; Refill: 1  Diabetes mellitus type II, controlled (HCC)- Blood sugar is well controlled. -     Hemoglobin A1c; Future -     COVID-19 mRNA Vac-TriS(Pfizer); Inject  0.3 mLs into the muscle once for 1 dose.  Dispense: 0.3 mL; Refill: 0  Hypertension secondary to other renal disorders- BP is adequately well controlled. -     TSH; Future -     COVID-19 mRNA Vac-TriS(Pfizer); Inject 0.3 mLs into the muscle once for 1 dose.  Dispense: 0.3 mL; Refill: 0 -     Carvedilol ; Take 1 tablet (12.5 mg total) by mouth 2 (two) times daily with a meal.  Dispense: 180 tablet; Refill: 1  Encounter for general adult medical examination with abnormal findings- Exam completed, labs reviewed, vaccines reviewed and updated, cancer screenings addressed, pt ed material was given.   Need for immunization against influenza -     Flu vaccine trivalent PF, 6mos and older(Flulaval,Afluria,Fluarix,Fluzone)  S/P bilateral BKA (below knee amputation) (HCC)- Doing well.     Follow-up: Return in about 6 months (around 04/16/2025).  Debby Molt, MD "

## 2024-10-17 ENCOUNTER — Ambulatory Visit: Payer: Self-pay | Admitting: Internal Medicine

## 2024-10-17 ENCOUNTER — Other Ambulatory Visit: Payer: Self-pay

## 2024-10-17 DIAGNOSIS — Z9483 Pancreas transplant status: Secondary | ICD-10-CM | POA: Diagnosis not present

## 2024-10-17 DIAGNOSIS — N1831 Chronic kidney disease, stage 3a: Secondary | ICD-10-CM | POA: Diagnosis not present

## 2024-10-17 DIAGNOSIS — S88119A Complete traumatic amputation at level between knee and ankle, unspecified lower leg, initial encounter: Secondary | ICD-10-CM | POA: Diagnosis not present

## 2024-10-17 DIAGNOSIS — M549 Dorsalgia, unspecified: Secondary | ICD-10-CM | POA: Diagnosis not present

## 2024-10-17 DIAGNOSIS — G546 Phantom limb syndrome with pain: Secondary | ICD-10-CM | POA: Diagnosis not present

## 2024-10-17 DIAGNOSIS — G890 Central pain syndrome: Secondary | ICD-10-CM | POA: Diagnosis not present

## 2024-10-17 DIAGNOSIS — G8191 Hemiplegia, unspecified affecting right dominant side: Secondary | ICD-10-CM | POA: Diagnosis not present

## 2024-10-17 DIAGNOSIS — G894 Chronic pain syndrome: Secondary | ICD-10-CM | POA: Diagnosis not present

## 2024-10-17 LAB — HEMOGLOBIN A1C: Hgb A1c MFr Bld: 6.5 % (ref 4.6–6.5)

## 2024-10-17 MED ORDER — OXYCODONE HCL 15 MG PO TABS
15.0000 mg | ORAL_TABLET | Freq: Four times a day (QID) | ORAL | 0 refills | Status: DC
  Filled 2024-10-17 – 2024-10-18 (×2): qty 120, 30d supply, fill #0

## 2024-10-18 ENCOUNTER — Other Ambulatory Visit: Payer: Self-pay | Admitting: Internal Medicine

## 2024-10-18 ENCOUNTER — Other Ambulatory Visit: Payer: Self-pay

## 2024-10-18 DIAGNOSIS — E785 Hyperlipidemia, unspecified: Secondary | ICD-10-CM

## 2024-10-19 ENCOUNTER — Encounter: Payer: Self-pay | Admitting: Internal Medicine

## 2024-10-19 ENCOUNTER — Other Ambulatory Visit: Payer: Self-pay | Admitting: Internal Medicine

## 2024-10-19 DIAGNOSIS — I151 Hypertension secondary to other renal disorders: Secondary | ICD-10-CM

## 2024-10-28 ENCOUNTER — Other Ambulatory Visit: Payer: Self-pay | Admitting: Internal Medicine

## 2024-10-28 ENCOUNTER — Encounter: Payer: Self-pay | Admitting: Radiology

## 2024-10-28 DIAGNOSIS — I151 Hypertension secondary to other renal disorders: Secondary | ICD-10-CM

## 2024-11-01 ENCOUNTER — Other Ambulatory Visit: Payer: Self-pay

## 2024-11-05 ENCOUNTER — Encounter (INDEPENDENT_AMBULATORY_CARE_PROVIDER_SITE_OTHER): Payer: Self-pay | Admitting: Otolaryngology

## 2024-11-05 ENCOUNTER — Ambulatory Visit (INDEPENDENT_AMBULATORY_CARE_PROVIDER_SITE_OTHER): Admitting: Otolaryngology

## 2024-11-05 DIAGNOSIS — K224 Dyskinesia of esophagus: Secondary | ICD-10-CM | POA: Diagnosis not present

## 2024-11-05 DIAGNOSIS — D3611 Benign neoplasm of peripheral nerves and autonomic nervous system of face, head, and neck: Secondary | ICD-10-CM

## 2024-11-05 DIAGNOSIS — K219 Gastro-esophageal reflux disease without esophagitis: Secondary | ICD-10-CM | POA: Diagnosis not present

## 2024-11-05 DIAGNOSIS — R221 Localized swelling, mass and lump, neck: Secondary | ICD-10-CM

## 2024-11-05 NOTE — Progress Notes (Signed)
 Dear Dr. Joshua, Here is my assessment for our mutual patient, [name]. Thank you for allowing me the opportunity to care for your patient. Please do not hesitate to contact me should you have any other questions. Sincerely, Dr. Eldora Blanch  Otolaryngology Clinic Note Referring provider: Dr. Joshua HPI:  [name] is a 48 y.o. male kindly referred by Dr. Joshua for evaluation of hearing loss and cerumen impaction and ear issues.  Initial visit: Patient reports: bilateral ear fullness, intermittent hearing loss; noted to have issues with wax, intermittently cleaned. Otherwise no issues with ears. Has tried mineral oil Patient currently denies: ear pain, fullness, vertigo, drainage, tinnitus Patient additionally denies: deep pain in ear canal, eustachian tube symptoms such as popping, crackling, sensitive to pressure changes Patient also denies barotrauma, vestibular suppressant use, ototoxic medication use No prior audio --------------------------------------------------------- 07/10/2024 Returns for follow up. Continued mild ear fullness which has recurred. He does not think his hearing is an issue. Otherwise no vertigo, pain, drainage, infections or tinnitus. We again discussed audiogram but he declined He did have an MRI on 03/25/2024 --------------------------------------------------------- 09/05/2024 The patient gave consent to have this visit done by telemedicine / virtual visit, two identifiers were used to identify patient and spouse. This is also consent for access the chart and treat the patient via this visit. The patient is located in [place] .  I, the provider, am at the office.  We spent 7 minutes together for the visit.  Joined by audio  We discuss US  results and options. He otherwise having mild dysphagia but no pain in the neck or otalgia.  --------------------------------------------------------- 09/24/2024 Returns for follow up. We discussed his path. He  reports some epigastric discomfort and does report that he is having dysphagia, which he says started since he had his stroke. Has had prior swallow evaluations showing dysmotility. No pain in neck, otalgia, trouble breathing, voice changes or other issues from H&N standpoint. --------------------------------------------------------- 11/05/2024 The patient gave consent to have this visit done by telemedicine / virtual visit, two identifiers were used to identify patient. This is also consent for access the chart and treat the patient via this visit. The patient is located in [place] .  I, the provider, am at the office.  We spent 25 minutes together and in preparation for the visit.  Joined by telephone  Discussed results on the phone with patient and wife. He continues to have some issues swallowing/dysphagia since his stroke which is stable. Did have MBS. No neck pain, otalgia, trouble breathing, voice changes. He did have an MRI which we discussed as well. He has thought about this and would like to get an opinion about managing the schwannoma  H&N Surgery: no Personal or FHx of bleeding dz or anesthesia difficulty: no  PMHx: PAD, HTN, Strokes, s/p Kidney and Pancreas Transplant, Carotid Stenosis, T1DM, ESRD  Independent Review of Additional Tests or Records:  Dr. Joshua (Acute Care) 10/25/2023 referral notes - noted hearing loss, b/l cerumen impaction; Dx: cerumen impaction; Rx: ref ENT Labs 12/12/2023 CMP and CBC: Cr 2.07, GFR 39; WBV 4.6, Hgb 14.4, Plt 202 CT Head 10/11/2023 independently interpreted with attention to ears: mastoids and ME well aerated; b/l cerumen impaction; otic capsule and ossicles unremarkable though cuts thick MRI Brain 10/08/2023 independently interpreted and reviewed - no retrocochlear lesion or mastoid effusion MRI Brain 03/25/2024 indepedently interpreted with respect to ears: - no mastoid effusion noted with exception of possible right mastoid tip effusion, no  retrocochlear lesions noted; study  suboptimal since no IAC dedicated cuts noted CMP 07/02/2024 reviewed: BUN/Cr 26/2.31 US  Neck 07/29/2024 independently inteprreted: right 3x1x1 cm mixed solid cyst neck mass; no other worrisom adenopathy noted at least on US  Jessica Zehr (04/18/2024) GI - dysphagia with pills mostly; problem since his stroke; EGD 2024 with esophageal candidiasis; Dx: Dysphagia; Rx: esophagram; treat thrush Path 09/16/2024: Schwannoma 09/20/2023 MBS:   Esophagram 05/06/2024:     MBS 10/15/2024 Leita Bream interpreted: noted oropharyngeal swallow fairly good with some reduced UES opening but noted retention of barium throughout esophagus concerning for dysmotility.  MRI Neck without 10/15/2024: noted ~2x2 cm schwannoma, appears to be from cervical spine root and NOT vagus. 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   Diabetes mellitus without complication [facility])    no since pancreas transplant   Dialysis patient 04/18/2012   [facility] [place], [place], Sat   Gastroparesis    Gastropathy    GERD (gastroesophageal reflux disease)    Hypertension    MRSA infection    over 10 years ago per patient. in legs     Past Surgical History:  Procedure Laterality Date   AV FISTULA PLACEMENT  08/2011   left upper arm   BELOW KNEE LEG AMPUTATION  it's been awhile   bilaterally   CATARACT EXTRACTION  ~ 2011   right   COMBINED KIDNEY-PANCREAS TRANSPLANT  2014   ESOPHAGOGASTRODUODENOSCOPY N/A 12/30/2016   Procedure: ESOPHAGOGASTRODUODENOSCOPY (EGD);  Surgeon: Belvie Just, MD;  Location: [facility] END[facility]COPY;  Service: Endoscopy;  Laterality: N/A;   IR US  GUIDE BX ASP/DRAIN  09/16/2024   OLECRANON BURSECTOMY  Right 06/23/2020   Procedure: RIGHT ELBOW EXCISION OLECRANON BURSITIS;  Surgeon: Harden Jerona GAILS, MD;  Location: [facility];  Service: Orthopedics;  Laterality: Right;    Family History  Problem Relation Age of Onset   Hypertension Mother    Diabetes Mother    Kidney disease Mother    Diabetes Maternal Grandmother    Diabetes Paternal Grandmother    Diabetes Other    Hypertension Other    Lung cancer Maternal Aunt    Colon cancer Neg Hx    Esophageal cancer Neg Hx    Rectal cancer Neg Hx    Stomach cancer Neg Hx      Social Connections: Moderately Integrated (10/15/2024)   Social Connection and Isolation Panel    Frequency of Communication with Friends and Family: More than three times a week    Frequency of Social Gatherings with Friends and Family: Twice a week    Attends Religious Services: More than 4 times per year    Active Member of [employer] or Organizations: No    Attends Engineer, Structural: Not on file    Marital Status: Married      Current Outpatient Medications:    acetaminophen  (TYLENOL ) 325 MG tablet, Take 1-2 tablets (325-650 mg total) by mouth every 4 (four) hours as needed for mild pain (pain score 1-3)., Disp: , Rfl:    aspirin  EC 81 MG tablet, Take 1 tablet (81 mg total) by mouth daily. Swallow whole., Disp: , Rfl:    atorvastatin  (LIPITOR) 40 MG tablet,  TAKE 1 TABLET(40 MG) BY MOUTH DAILY, Disp: 90 tablet, Rfl: 1   brimonidine  (ALPHAGAN ) 0.2 % ophthalmic solution, Place 1 drop into both eyes in the morning and at bedtime., Disp: 5 mL, Rfl: 3   carvedilol  (COREG ) 12.5 MG tablet, TAKE 1 TABLET(12.5 MG) BY MOUTH TWICE DAILY WITH A MEAL, Disp: 180 tablet, Rfl: 1   cycloSPORINE  modified (NEORAL ) 100 MG capsule, Take 100 mg by mouth 2 (two) times daily. Taken with 50 mg tablet, Disp: , Rfl:    cycloSPORINE  modified (NEORAL ) 50 MG capsule, Take 1 capsule by mouth two times daily, Take along with 100 mg twice a day, Disp: 60 capsule, Rfl: 5    dorzolamide -timolol  (COSOPT ) 2-0.5 % ophthalmic solution, Place 1 drop into both eyes 2 (two) times daily., Disp: 10 mL, Rfl: 2   hydrALAZINE  (APRESOLINE ) 50 MG tablet, TAKE 1 TABLET(50 MG) BY MOUTH THREE TIMES DAILY, Disp: 270 tablet, Rfl: 0   latanoprost  (XALATAN ) 0.005 % ophthalmic solution, Place 1 drop into the left eye daily., Disp: 7.5 mL, Rfl: 1   mycophenolate  (MYFORTIC ) 180 MG EC tablet, Take 1 tablet (180 mg total) by mouth 2 (two) times daily., Disp: 180 tablet, Rfl: 0   naloxone  (NARCAN ) nasal spray 4 mg/0.1 mL, Inhale 1 (one) Spray if poorly responding or lips turning blue, Disp: 2 each, Rfl: 2   naloxone  (NARCAN ) nasal spray 4 mg/0.1 mL, Place 1 spray if poorly responding or lips turning blue, Disp: 2 each, Rfl: 2   nystatin  (MYCOSTATIN ) 100000 UNIT/ML suspension, Swish and swallow 5 mls four times daily for 14 days, Disp: 280 mL, Rfl: 0   omeprazole  (PRILOSEC) 40 MG capsule, Take 40 mg by mouth daily., Disp: , Rfl:    oxyCODONE  (ROXICODONE ) 15 MG immediate release tablet, Take 1 tablet (15 mg total) by mouth 4 (four) times daily as needed., Disp: 120 tablet, Rfl: 0   oxyCODONE  (ROXICODONE ) 15 MG immediate release tablet, Take 1 tablet (15 mg total) by mouth in the morning, at noon, in the evening, and at bedtime if needed for pain, Disp: 120 tablet, Rfl: 0   predniSONE  (DELTASONE ) 5 MG tablet, Take 5 mg by mouth daily., Disp: , Rfl:    sulfamethoxazole -trimethoprim  (BACTRIM ) 400-80 MG tablet, Take 1 tablet by mouth every Monday, Wednesday, and Friday., Disp: 12 tablet, Rfl: 0   terazosin  (HYTRIN ) 2 MG capsule, TAKE 2 CAPSULES(4 MG) BY MOUTH AT BEDTIME, Disp: 180 capsule, Rfl: 0   Physical Exam:   There were no vitals taken for this visit.  Salient findings:  Unable to assess as it was a phone visit   Seprately Identifiable Procedures:  Prior to proceeding, R/B/A was discussed and verbal consent obtained for any procedures Procedure Note (prior, not today) Pre-procedure  diagnosis:  Dysphagia, schwannoma - assess vocal fold function Post-procedure diagnosis: Same Procedure: Transnasal Fiberoptic Laryngoscopy, CPT 31575 - Mod 25 Indication: see above Complications: None apparent EBL: 0 mL  The procedure was undertaken to further evaluate the patient's complaint above, with mirror exam inadequate for appropriate examination due to gag reflex and poor patient tolerance  Procedure:  Patient was identified as correct patient. Verbal consent was obtained. The nose was sprayed with oxymetazoline and 4% lidocaine . The The flexible laryngoscope was passed through the nose to view the nasal cavity, pharynx (oropharynx, hypopharynx) and larynx.  The larynx was examined at rest and during multiple phonatory tasks. Documentation was obtained and reviewed with patient. The scope was removed. The patient tolerated the procedure well.  Findings: The nasal cavity and nasopharynx did not reveal any masses or lesions, mucosa appeared to be without obvious lesions. The tongue base, pharyngeal walls, piriform sinuses, vallecula, epiglottis and postcricoid region are normal in appearance; does appear to be sensate over epiglottis and supraglottis. The visualized portion of the subglottis and proximal trachea is widely patent. The vocal folds are mobile bilaterally. There are no lesions on the free edge of the vocal folds nor elsewhere in the larynx worrisome for malignancy.    Electronically signed by: Eldora KATHEE Blanch, MD 11/10/2024 8:43 AM      Impression & Plans:  Neshawn Aird is a 48 y.o. male with:  1. Schwannoma of nerve of neck   2. Esophageal dysmotility   3. Laryngopharyngeal reflux (LPR)   4. Neck mass    Complicated situation given his immunocompromised state and recent stroke. MBS shows dysphagia more esophageal in nature since no aspiration. Recommend GI follow up   For his schwannoma, since more spinal/from C6, will refer to NSGY for management  F/u as  scheduled with Chyrl in Jan for cerumen  See below regarding exact medications prescribed this encounter including dosages and route: No orders of the defined types were placed in this encounter.     Thank you for allowing me the opportunity to care for your patient. Please do not hesitate to contact me should you have any other questions.  Sincerely, Eldora Blanch, MD Otolaryngologist (ENT), [facility] Phone: [phone] Fax: [phone]  11/10/2024, 8:43 AM   MDM:  Complexity/Problems addressed: mod - multiple chronic issues Data complexity: mod - independent interpretation of multiple imaging (MBS, MRI) - Morbidity:   - Prescription Drug prescribed or managed: n

## 2024-11-05 NOTE — Progress Notes (Signed)
 [facility] - Clinic Note  11/18/2024   CHIEF COMPLAINT Patient presents for Retina Follow Up  HISTORY OF PRESENT ILLNESS: [name] is a 48 y.o. male who presents to the clinic today for:  HPI     Retina Follow Up   Patient presents with  Diabetic Retinopathy.  In both eyes.  This started 4 weeks ago.  I, the attending physician,  performed the HPI with the patient and updated documentation appropriately.        Comments   Pt denies any changes or concerns with vision. Pt is consistent with Brimonidine  BID OS and Cosopt  BID OU. A1c=6.5 10/16/2024 BS=no monitored, no longer diabetic since pancreas translplant.      Last edited by [name], MD on 11/18/2024  5:41 PM.     Pt states VA is stable  Referring physician: [name], MD [address] [place],  [place] [address]  HISTORICAL INFORMATION:  Selected notes from the MEDICAL RECORD NUMBER Referred by Dr. Hamilton Octavia for PDR OU LEE:  Ocular Hx- previously followed with Dr. 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   dorzolamide -timolol  (COSOPT ) 2-0.5 % ophthalmic solution Place 1 drop into both eyes 2 (two) times daily.   latanoprost  (XALATAN ) 0.005 % ophthalmic solution Place 1 drop into the left eye daily.   No current facility-administered medications for this visit. (Ophthalmic Drugs)   Current Outpatient Medications (Other)  Medication Sig   acetaminophen  (TYLENOL ) 325 MG tablet Take 1-2 tablets (325-650 mg total) by mouth every 4 (four) hours as needed for mild pain (pain score 1-3).   aspirin  EC 81 MG tablet Take 1 tablet (81 mg total)  by mouth daily. Swallow whole.   atorvastatin  (LIPITOR) 40 MG tablet TAKE 1 TABLET(40 MG) BY MOUTH DAILY   carvedilol  (COREG ) 12.5 MG tablet TAKE 1 TABLET(12.5 MG) BY MOUTH TWICE DAILY WITH A MEAL   cycloSPORINE  modified (NEORAL ) 100 MG capsule Take 100 mg by mouth 2 (two) times daily. Taken with 50 mg tablet   cycloSPORINE  modified (NEORAL ) 50 MG capsule Take 1 capsule by mouth two times daily, Take along with 100 mg twice a day   hydrALAZINE  (APRESOLINE ) 50 MG tablet TAKE 1 TABLET(50 MG) BY MOUTH THREE TIMES DAILY   mycophenolate  (MYFORTIC ) 180 MG EC tablet Take 1 tablet (180 mg total) by mouth 2 (two) times daily.   naloxone  (NARCAN ) nasal spray 4 mg/0.1 mL Inhale 1 (one) Spray if poorly responding or lips turning blue   naloxone  (NARCAN ) nasal spray 4 mg/0.1 mL Place 1 spray if poorly responding or lips turning blue   nystatin  (MYCOSTATIN ) 100000 UNIT/ML suspension Swish and swallow 5 mls four times daily for 14 days   omeprazole  (PRILOSEC) 40 MG capsule Take 40 mg by mouth daily.   oxyCODONE  (ROXICODONE ) 15 MG immediate release tablet Take 1 tablet (15 mg total) by mouth 4 (four) times daily as needed.   oxyCODONE  (ROXICODONE ) 15 MG immediate release tablet Take 1 tablet (15 mg total) by mouth 4 (four) times daily as needed for pain.   predniSONE  (DELTASONE ) 5 MG tablet Take 5 mg by mouth daily.   sulfamethoxazole -trimethoprim  (  BACTRIM ) 400-80 MG tablet Take 1 tablet by mouth every Monday, Wednesday, and Friday.   terazosin  (HYTRIN ) 2 MG capsule TAKE 2 CAPSULES(4 MG) BY MOUTH AT BEDTIME   No current facility-administered medications for this visit. (Other)   REVIEW OF SYSTEMS: ROS   Positive for: Musculoskeletal, Eyes Last edited by Elnor Avelina RAMAN, COT on 11/18/2024  3:15 PM.     ALLERGIES Allergies  Allergen Reactions   Belatacept  Anaphylaxis    hypotensive with altered reaction and required epinephrine .  Needed to be intubated for airway protection.   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   Diabetes mellitus without complication [facility])    no since pancreas transplant   Dialysis patient 04/18/2012   [facility]; [place], [place], [place]   Gastroparesis    Gastropathy    GERD (gastroesophageal reflux disease)    Hypertension    MRSA infection    over 10 years ago per patient. in legs   Past Surgical History:  Procedure Laterality Date   AV FISTULA PLACEMENT  08/2011   left upper arm   BELOW KNEE LEG AMPUTATION  it's been awhile   bilaterally   CATARACT EXTRACTION  ~ 2011   right   COMBINED KIDNEY-PANCREAS TRANSPLANT  2014   ESOPHAGOGASTRODUODENOSCOPY N/A 12/30/2016   Procedure: ESOPHAGOGASTRODUODENOSCOPY (EGD);  Surgeon: Belvie Just, MD;  Location[facility] ENDOSCOPY;  Service: Endoscopy;  Laterality: N/A;   IR US  GUIDE BX ASP/DRAIN  09/16/2024   OLECRANON BURSECTOMY Right 06/23/2020   Procedure: RIGHT ELBOW EXCISION OLECRANON BURSITIS;  Surgeon: Harden Jerona GAILS, MD;  Location: [facility];  Service: Orthopedics;  Laterality: Right;   FAMILY HISTORY Family History  Problem Relation Age of Onset   Hypertension Mother    Diabetes Mother    Kidney disease Mother    Diabetes Maternal Grandmother    Diabetes Paternal Grandmother    Diabetes Other    Hypertension Other    Lung cancer Maternal Aunt    Colon cancer Neg Hx    Esophageal cancer Neg Hx    Rectal cancer Neg Hx    Stomach cancer Neg Hx    SOCIAL HISTORY Social History   Tobacco Use   Smoking status: Every Day    Types: Cigars    Last attempt to quit: 07/06/2022    Years since quitting: 2.3   Smokeless tobacco: Never   Tobacco comments:    black and mild  Vaping Use   Vaping status:  Never Used  Substance Use Topics   Alcohol use: No   Drug use: Yes    Frequency: 3.0 times per week    Types: Marijuana    Comment: Occasionally       OPHTHALMIC EXAM:  Base Eye Exam     Visual Acuity (Snellen - Linear)       Right Left   Dist cc 20/25 -2 20/100 -2   Dist ph cc NI NI         Tonometry (Tonopen, 3:23 PM)       Right Left   Pressure 17 20  Pupils       Pupils Dark Light Shape React APD   Right PERRL 3 2 Round Brisk None   Left PERRL 3 2 Round Brisk None         Visual Fields       Left Right    Full Full         Extraocular Movement       Right Left    Full, Ortho Full, Ortho         Neuro/Psych     Oriented x3: Yes   Mood/Affect: Normal         Dilation     Both eyes: 1.0% Mydriacyl, 2.5% Phenylephrine  @ 3:24 PM           Slit Lamp and Fundus Exam     Slit Lamp Exam       Right Left   Lids/Lashes Dermatochalasis - upper lid Dermatochalasis - upper lid   Conjunctiva/Sclera Melanosis Melanosis   Cornea well healed cataract wound, trace PEE, trace tear film debris well healed cataract wound, trace PEE, tear film debris   Anterior Chamber deep and clear deep and clear   Iris Round and moderately dilated, No NVI Round and moderately dilated, No NVI   Lens PC IOL in good position with open PC PC IOL in good position with open PC   Anterior Vitreous White blood stained vit condensations settled inferiorly. Old white blood stained vitreous condensations-improving         Fundus Exam       Right Left   Disc 3+Pallor, Sharp rim, +fibrosis 3+Pallor, Sharp rim, +fibrosis, vascular loops / NVD-regressed   C/D Ratio 0.3 0.4   Macula Flat, Good foveal reflex, scattered MA / DBH, central cystic changes, +atrophy Flat, good foveal reflex, scattered fibrosis, scattered MA/DBH   Vessels attenuated, Tortuous, +NV-regressing severe attenuation, Tortuous, +NV--regressing   Periphery Attached, scattered MA / DBH Attached,  scattered MA, diffuse atrophy, good PRP changes 360, room for fill in inferiorly           Refraction     Wearing Rx       Sphere Cylinder Axis Add   Right -2.25 +1.25 127 +1.25   Left -1.50 +1.00 045 +1.25           IMAGING AND PROCEDURES  Imaging and Procedures for 11/18/2024  OCT, Retina - OU - Both Eyes       Right Eye Quality was good. Central Foveal Thickness: 257. Progression has been stable. Findings include no SRF, abnormal foveal contour, intraretinal fluid, inner retinal atrophy, vitreomacular adhesion (Diffuse IRA, central cystic changes-persistent, +vitreous opacities -- stably improved).   Left Eye Quality was good. Central Foveal Thickness: 220. Progression has been stable. Findings include no SRF, abnormal foveal contour, epiretinal membrane, intraretinal fluid, vitreous traction, preretinal fibrosis (Diffuse atrophy, prominent posterior hyaloid with stable improvement in subhyaloid opacities, focal tractional edema along ST arcades, persistent cystic changes).   Notes *Images captured and stored on drive  Diagnosis / Impression:  OD: Diffuse IRA, central cystic changes-persistent, +vitreous opacities -- stably improved OS: Diffuse atrophy, prominent posterior hyaloid with stable improvement in subhyaloid opacities, focal tractional edema along ST arcades, persistent cystic changes  Clinical management:  See below  Abbreviations: NFP - Normal foveal profile. CME - cystoid macular edema. PED - pigment epithelial detachment. IRF - intraretinal fluid. SRF - subretinal fluid. EZ - ellipsoid zone. ERM - epiretinal membrane. ORA - outer retinal atrophy. ORT - outer retinal  tubulation. SRHM - subretinal hyper-reflective material. IRHM - intraretinal hyper-reflective material      Intravitreal Injection, Pharmacologic Agent - OD - Right Eye       Time Out 11/18/2024. 3:39 PM. Confirmed correct patient, procedure, site, and patient consented.    Anesthesia Topical anesthesia was used. Anesthetic medications included Lidocaine  2%, Proparacaine 0.5%.   Procedure Preparation included 5% betadine to ocular surface, eyelid speculum. A (32g) needle was used.   Injection: 1.25 mg Bevacizumab  1.25mg /0.14ml   Route: Intravitreal, Site: Right Eye   NDC: H525437, Lot: [id number], Expiration date: 02/15/2025   Post-op Post injection exam found visual acuity of at least counting fingers. The patient tolerated the procedure well. There were no complications. The patient received written and verbal post procedure care education.      Intravitreal Injection, Pharmacologic Agent - OS - Left Eye       Time Out 11/18/2024. 3:40 PM. Confirmed correct patient, procedure, site, and patient consented.   Anesthesia Topical anesthesia was used. Anesthetic medications included Lidocaine  2%, Proparacaine 0.5%.   Procedure Preparation included 5% betadine to ocular surface, eyelid speculum. A (32g) needle was used.   Injection: 1.25 mg Bevacizumab  1.25mg /0.86ml   Route: Intravitreal, Site: Left Eye   NDC: H525437, Lot: [id number], Expiration date: 01/30/2025   Post-op Post injection exam found visual acuity of at least counting fingers. The patient tolerated the procedure well. There were no complications. The patient received written and verbal post procedure care education.           ASSESSMENT/PLAN:   ICD-10-CM   1. Proliferative diabetic retinopathy of both eyes with macular edema associated with type 2 diabetes mellitus (HCC)  E11.3513 OCT, Retina - OU - Both Eyes    Intravitreal Injection, Pharmacologic Agent - OD - Right Eye    Intravitreal Injection, Pharmacologic Agent - OS - Left Eye    Bevacizumab  (AVASTIN ) SOLN 1.25 mg    Bevacizumab  (AVASTIN ) SOLN 1.25 mg    2. Optic neuropathy  H46.9     3. Essential hypertension  I10     4. Hypertensive retinopathy of both eyes  H35.033     5. Bilateral ocular hypertension   H40.053     6. Pseudophakia, both eyes  Z96.1     7. History of stroke  Z86.73      **Pt reports decreased vision OU since having stroke in Sept 2024**  1. Proliferative diabetic retinopathy, both eyes  - last A1c 6.4 on 04.30.25 -- s/p kidney and pancreas transplant - IVA OU #1 (05.23.25), #2 (06.23.25), #3 (07.21.25) #4 (08.20.29), #5 (09.17.25), #6 (10.17.25)  - s/p PRP OS 07.29.25 - exam shows blood stained vit condensations OU (OD red; OS white); scattered NV/fibrosis OU - FA (05.23.25) shows OD: Large areas of vascular non-perfusion superior, nasal and inferior midzone, leaking NV superior and inferior to disc; OS: Large areas of vascular non-perfusion superior, nasal and inferior midzone, leaking NV greatest nasal to disc and along temporal arcades -- pt will need PRP OU - OCT shows OD: Diffuse IRA, central cystic changes-persistent, +vitreous opacities -- stably improved; OS: Diffuse atrophy, prominent posterior hyaloid with stable improvement in subhyaloid opacities, focal tractional edema along ST arcades, persistent cystic changes at 5+ wks  **discussed decreased efficacy / resistance to Avastin  and potential benefit of switching to Eylea**  - Eylea approved and payable 100% - BCVA OD 20/25 - stable; OS 20/100 stable - IOP 20 OS today  - recommend IVA today OU #7 (11.24.25) w/  follow up ext to 6-7 wks - pt wishes to proceed with injection - RBA of procedure discussed, questions answered - IVA informed consent obtained and signed, 05.23.25 - see procedure note  - f/u 6-7 weeks DFE, OCT, possible injections  2. Bilateral optic neuropathy  - thought to be secondary to tacrolimus  use - previously managed and monitored by Dr. Merita visit 2022  - exam shows 3+ disc pallor OU  - monitor  3,4. Hypertensive retinopathy OU - discussed importance of tight BP control - monitor  5. Ocular Hypertension OU  - IOP 17,20 --hx of steroid response OS  - h/o possible adverse rxn  to Brimonidine   - switched to Cosopt  BID OU - monitor  6. Pseudophakia OU  - s/p CE/IOL  - IOL in good position, doing well  - monitor  7. H/o CVA - Sept .2024  - occurred while receiving belatacept  infusion  - MRI Brain 09.23.2024 IMPRESSION: 1. Scattered small foci of acute subcortical infarction in the left occipital lobe and posterior left frontal lobe. 2. Faint diffusion signal abnormality along the left parietal lobe may reflect evolving infarct versus seizure related cytotoxic edema. 3. 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   Ophthalmic Meds Ordered this visit:  Meds ordered this encounter  Medications   Bevacizumab  (AVASTIN ) SOLN 1.25 mg   Bevacizumab  (AVASTIN ) SOLN 1.25 mg     Return in about 7 weeks (around 01/06/2025) for f/u, PDR, DFE, OCT, Possible, IVA, OU.  There are no Patient Instructions on file for this visit.  Explained the diagnoses, plan, and follow up with the patient and they expressed understanding.  Patient expressed understanding of the importance of proper follow up care.   This document serves as a record of services personally performed by Redell JUDITHANN Hans, MD, PhD. It was created on their behalf by Avelina Pereyra, COA an ophthalmic technician. The creation of this record is the provider's dictation and/or activities during the visit.   Electronically signed by: Avelina GORMAN Pereyra, COT  11/18/24  8:49 PM   This document serves as a record of services personally performed by Redell JUDITHANN Hans, MD, PhD. It was created on their behalf by Wanda GEANNIE Keens, COT an ophthalmic technician. The creation of this record is the provider's dictation and/or activities during the visit.    Electronically signed by:  Wanda GEANNIE Keens, COT  11/18/24 8:49 PM  Redell JUDITHANN Hans, M.D., Ph.D. Diseases & Surgery of the Retina and Vitreous [facility] 11/18/2024  I have reviewed the above documentation for accuracy  and completeness, and I agree with the above. Redell JUDITHANN Hans, M.D., Ph.D. 11/18/24 8:51 PM   Abbreviations: M myopia (nearsighted); A astigmatism; H hyperopia (farsighted); P presbyopia; Mrx spectacle prescription;  CTL contact lenses; OD right eye; OS left eye; OU both eyes  XT exotropia; ET esotropia; PEK punctate epithelial keratitis; PEE punctate epithelial erosions; DES dry eye syndrome; MGD meibomian gland dysfunction; ATs artificial tears; PFAT's preservative free artificial tears; NSC nuclear sclerotic cataract; PSC posterior subcapsular cataract; ERM epi-retinal membrane; PVD posterior vitreous detachment; RD retinal detachment; DM diabetes mellitus; DR diabetic retinopathy; NPDR non-proliferative diabetic retinopathy; PDR proliferative diabetic retinopathy; CSME clinically significant macular edema; DME diabetic macular edema; dbh dot blot hemorrhages; CWS cotton wool spot; POAG primary open angle glaucoma; C/D cup-to-disc ratio; HVF humphrey visual field; GVF goldmann visual field; OCT optical coherence tomography; IOP intraocular pressure; BRVO Branch retinal vein occlusion; CRVO central retinal vein occlusion;  CRAO central retinal artery occlusion; BRAO branch retinal artery occlusion; RT retinal tear; SB scleral buckle; PPV pars plana vitrectomy; VH Vitreous hemorrhage; PRP panretinal laser photocoagulation; IVK intravitreal kenalog; VMT vitreomacular traction; MH Macular hole;  NVD neovascularization of the disc; NVE neovascularization elsewhere; AREDS age related eye disease study; ARMD age related macular degeneration; POAG primary open angle glaucoma; EBMD epithelial/anterior basement membrane dystrophy; ACIOL anterior chamber intraocular lens; IOL intraocular lens; PCIOL posterior chamber intraocular lens; Phaco/IOL phacoemulsification with intraocular lens placement; PRK photorefractive keratectomy; LASIK laser assisted in situ keratomileusis; HTN hypertension; DM diabetes mellitus; COPD  chronic obstructive pulmonary disease

## 2024-11-12 ENCOUNTER — Telehealth (INDEPENDENT_AMBULATORY_CARE_PROVIDER_SITE_OTHER): Payer: Self-pay | Admitting: Otolaryngology

## 2024-11-13 NOTE — Telephone Encounter (Signed)
 LVM informing patient that he can take referral to the provider of his choice.

## 2024-11-18 ENCOUNTER — Encounter (INDEPENDENT_AMBULATORY_CARE_PROVIDER_SITE_OTHER): Payer: Self-pay | Admitting: Ophthalmology

## 2024-11-18 ENCOUNTER — Other Ambulatory Visit: Payer: Self-pay

## 2024-11-18 ENCOUNTER — Ambulatory Visit (INDEPENDENT_AMBULATORY_CARE_PROVIDER_SITE_OTHER): Admitting: Ophthalmology

## 2024-11-18 DIAGNOSIS — H469 Unspecified optic neuritis: Secondary | ICD-10-CM | POA: Diagnosis not present

## 2024-11-18 DIAGNOSIS — Z8673 Personal history of transient ischemic attack (TIA), and cerebral infarction without residual deficits: Secondary | ICD-10-CM

## 2024-11-18 DIAGNOSIS — E113513 Type 2 diabetes mellitus with proliferative diabetic retinopathy with macular edema, bilateral: Secondary | ICD-10-CM | POA: Diagnosis not present

## 2024-11-18 DIAGNOSIS — H35033 Hypertensive retinopathy, bilateral: Secondary | ICD-10-CM | POA: Diagnosis not present

## 2024-11-18 DIAGNOSIS — H40053 Ocular hypertension, bilateral: Secondary | ICD-10-CM

## 2024-11-18 DIAGNOSIS — Z961 Presence of intraocular lens: Secondary | ICD-10-CM

## 2024-11-18 DIAGNOSIS — I10 Essential (primary) hypertension: Secondary | ICD-10-CM | POA: Diagnosis not present

## 2024-11-18 MED ORDER — OXYCODONE HCL 15 MG PO TABS
15.0000 mg | ORAL_TABLET | Freq: Four times a day (QID) | ORAL | 0 refills | Status: AC | PRN
  Filled 2024-11-18: qty 120, 30d supply, fill #0

## 2024-11-18 MED ORDER — BEVACIZUMAB CHEMO INJECTION 1.25MG/0.05ML SYRINGE FOR KALEIDOSCOPE
1.2500 mg | INTRAVITREAL | Status: AC | PRN
  Administered 2024-11-18: 1.25 mg via INTRAVITREAL

## 2024-11-18 NOTE — Progress Notes (Deleted)
 Referring Physician:  Tobie Eldora NOVAK, MD [address] [place],  [place] [address]  Primary Physician:  Joshua Debby CROME, MD  Discussed the use of AI scribe software for clinical note transcription with the patient, who gave verbal consent to proceed.  History of Present Illness   Where is the tumor?  Does it hurt or cause weakness?   Schwannoma     PreCharting for nerve patients : EMG? - none Imaging of Nerve, Ultrasound or MRI? - 10/15/2024-MRI, 07/29/2024-US  Prior Surgery?-     Main Question for Surgeon: ***  Review of Systems:  A 10 point review of systems is negative, except for the pertinent positives and negatives detailed in the HPI.  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   Diabetes mellitus without complication [facility])    no since pancreas transplant   Dialysis patient 04/18/2012   [facility]; [place], [place], [place]   Gastroparesis    Gastropathy    GERD (gastroesophageal reflux disease)    Hypertension    MRSA infection    over 10 years ago per patient. in legs    Past Surgical History: Past Surgical History:  Procedure Laterality Date   AV FISTULA PLACEMENT  08/2011   left upper arm   BELOW KNEE LEG AMPUTATION  it's been awhile   bilaterally   CATARACT EXTRACTION  ~ 2011   right   COMBINED KIDNEY-PANCREAS TRANSPLANT  2014   ESOPHAGOGASTRODUODENOSCOPY N/A 12/30/2016   Procedure: ESOPHAGOGASTRODUODENOSCOPY (EGD);  Surgeon: Belvie Just, MD;  Location: [facility] ENDOSCOPY;  Service: Endoscopy;  Laterality: N/A;   IR US  GUIDE BX ASP/DRAIN  09/16/2024   OLECRANON BURSECTOMY Right 06/23/2020   Procedure: RIGHT ELBOW EXCISION OLECRANON BURSITIS;  Surgeon: Harden Jerona GAILS, MD;  Location: [facility];  Service: Orthopedics;  Laterality: Right;    Allergies: Allergies as of 11/27/2024 - Review Complete 11/18/2024  Allergen Reaction Noted   Belatacept  Anaphylaxis 09/30/2023   Amlodipine  Rash 02/17/2017   Lisinopril Rash 02/17/2017    Medications:  Current Outpatient Medications:    acetaminophen  (TYLENOL ) 325 MG tablet, Take 1-2 tablets (325-650 mg total) by mouth every 4 (four) hours as needed for mild pain (pain score 1-3)., Disp: , Rfl:    aspirin  EC 81 MG tablet, Take 1 tablet (81 mg total) by mouth daily. Swallow whole., Disp: , Rfl:    atorvastatin  (LIPITOR) 40 MG tablet, TAKE 1 TABLET(40 MG) BY MOUTH DAILY, Disp: 90 tablet, Rfl: 1   brimonidine  (ALPHAGAN ) 0.2 % ophthalmic solution, Place 1 drop into both eyes in the morning and at bedtime., Disp: 5 mL, Rfl: 3   carvedilol  (COREG ) 12.5 MG tablet, TAKE 1 TABLET(12.5 MG) BY MOUTH TWICE DAILY WITH A MEAL, Disp: 180 tablet, Rfl: 1   cycloSPORINE  modified (NEORAL ) 100 MG capsule, Take 100 mg by mouth 2 (two) times daily. Taken with 50 mg tablet, Disp: , Rfl:    cycloSPORINE  modified (NEORAL ) 50 MG capsule, Take 1 capsule by mouth two times daily, Take along with 100 mg twice a day, Disp: 60 capsule, Rfl: 5   dorzolamide -timolol  (COSOPT ) 2-0.5 % ophthalmic solution, Place 1 drop into both eyes  2 (two) times daily., Disp: 10 mL, Rfl: 2   hydrALAZINE  (APRESOLINE ) 50 MG tablet, TAKE 1 TABLET(50 MG) BY MOUTH THREE TIMES DAILY, Disp: 270 tablet, Rfl: 0   latanoprost  (XALATAN ) 0.005 % ophthalmic solution, Place 1 drop into the left eye daily., Disp: 7.5 mL, Rfl: 1   mycophenolate  (MYFORTIC ) 180 MG EC tablet, Take 1 tablet (180 mg total) by mouth 2 (two) times daily., Disp: 180 tablet, Rfl: 0   naloxone  (NARCAN ) nasal spray 4 mg/0.1 mL, Inhale 1 (one) Spray if poorly responding or lips turning blue, Disp: 2 each, Rfl: 2   naloxone  (NARCAN ) nasal spray 4 mg/0.1 mL, Place 1 spray if poorly responding or lips turning blue, Disp:  2 each, Rfl: 2   nystatin  (MYCOSTATIN ) 100000 UNIT/ML suspension, Swish and swallow 5 mls four times daily for 14 days, Disp: 280 mL, Rfl: 0   omeprazole  (PRILOSEC) 40 MG capsule, Take 40 mg by mouth daily., Disp: , Rfl:    oxyCODONE  (ROXICODONE ) 15 MG immediate release tablet, Take 1 tablet (15 mg total) by mouth 4 (four) times daily as needed., Disp: 120 tablet, Rfl: 0   oxyCODONE  (ROXICODONE ) 15 MG immediate release tablet, Take 1 tablet (15 mg total) by mouth 4 (four) times daily as needed for pain., Disp: 120 tablet, Rfl: 0   predniSONE  (DELTASONE ) 5 MG tablet, Take 5 mg by mouth daily., Disp: , Rfl:    sulfamethoxazole -trimethoprim  (BACTRIM ) 400-80 MG tablet, Take 1 tablet by mouth every Monday, Wednesday, and Friday., Disp: 12 tablet, Rfl: 0   terazosin  (HYTRIN ) 2 MG capsule, TAKE 2 CAPSULES(4 MG) BY MOUTH AT BEDTIME, Disp: 180 capsule, Rfl: 0  Social History: Social History   Tobacco Use   Smoking status: Every Day    Types: Cigars    Last attempt to quit: 07/06/2022    Years since quitting: 2.3   Smokeless tobacco: Never   Tobacco comments:    black and mild  Vaping Use   Vaping status: Never Used  Substance Use Topics   Alcohol use: No   Drug use: Yes    Frequency: 3.0 times per week    Types: Marijuana    Comment: Occasionally    Family Medical History: Family History  Problem Relation Age of Onset   Hypertension Mother    Diabetes Mother    Kidney disease Mother    Diabetes Maternal Grandmother    Diabetes Paternal Grandmother    Diabetes Other    Hypertension Other    Lung cancer Maternal Aunt    Colon cancer Neg Hx    Esophageal cancer Neg Hx    Rectal cancer Neg Hx    Stomach cancer Neg Hx     Physical Examination: There were no vitals filed for this visit.  General: Patient is in no apparent distress. Attention to examination is appropriate.  Neck:   Supple.  Full range of motion.  Respiratory: Patient is breathing without any  difficulty.   NEUROLOGICAL:  Physical Exam    Awake, alert, oriented to person, place, and time.  Speech is clear and fluent.   Cranial Nerves: Pupils equal round and reactive to light.  Facial tone is symmetric. Shoulder shrug is symmetric. Tongue protrusion is midline.  There is no pronator drift.  Motor Exam:  ***  Reflexes are ***2+ and symmetric at the biceps, triceps, brachioradialis, patella and achilles.   Hoffman's is absent. Clonus is Absent  Bilateral upper and lower extremity sensation is intact to light touch ***.  Gait is normal.  ***   Medical Decision Making  Imaging: ***  Electrodiagnostics: ***  I have personally reviewed the images and electrodiagnostics and agree with the above interpretation. Assessment & Plan      Penne MICAEL Sharps MD/MSCR Neurosurgery - Peripheral Nerve Surgery

## 2024-11-27 ENCOUNTER — Ambulatory Visit: Admitting: Neurosurgery

## 2024-11-29 ENCOUNTER — Other Ambulatory Visit: Payer: Self-pay

## 2024-12-02 ENCOUNTER — Other Ambulatory Visit: Payer: Self-pay

## 2024-12-02 ENCOUNTER — Ambulatory Visit: Admitting: Adult Health

## 2024-12-02 ENCOUNTER — Encounter: Payer: Self-pay | Admitting: Adult Health

## 2024-12-02 VITALS — BP 121/67 | HR 70 | Ht 73.0 in | Wt 249.0 lb

## 2024-12-02 DIAGNOSIS — E785 Hyperlipidemia, unspecified: Secondary | ICD-10-CM

## 2024-12-02 DIAGNOSIS — Z8673 Personal history of transient ischemic attack (TIA), and cerebral infarction without residual deficits: Secondary | ICD-10-CM | POA: Diagnosis not present

## 2024-12-02 DIAGNOSIS — E119 Type 2 diabetes mellitus without complications: Secondary | ICD-10-CM

## 2024-12-02 NOTE — Patient Instructions (Signed)
Your Plan:  Continue Aspirin  Blood pressure goal <130/90 Cholesterol LDL goal <70 Diabetes goal A1c <7 Monitor diet and try to exercise   Thank you for coming to see us at [facility]. I hope we have been able to provide you high quality care today.  You may receive a patient satisfaction survey over the next few weeks. We would appreciate your feedback and comments so that we may continue to improve ourselves and the health of our patients.  

## 2024-12-02 NOTE — Progress Notes (Signed)
 PATIENT: [name] DOB: 11/28/1976  REASON FOR VISIT: follow up HISTORY FROM: patient PRIMARY NEUROLOGIST: Dr. Rosemarie  Chief Complaint  Patient presents with   Follow-up    Pt in 4 with husband Pt here for stroke f/u Pt states no questions or concerns for today      HISTORY OF PRESENT ILLNESS: Today 12/02/24   [name] is a 48 y.o. male who has been followed in this office for stroke. Returns today for follow-up.  He is here today with his wife.  Overall he has remained stable.  Denies any additional strokelike symptoms.  Remains on aspirin .  Has had regular follow-ups with his primary care provider.  Remains on Crestor  for his cholesterol.  His last hemoglobin A1c was 6.5%.  He reports that his vision has improved with retinal injections and laser surgery.  He returns today for an evaluation.  HISTORY 12/12/23 from [facility] with his wife. Back at home with his wife. Doing PT, OT, ST, he is progressing. Can walk with rolling walker. Prior to stroke was independent, driving. Now, right side is weak, leg more than arm, right leg drags, is heavy. Speech has improved, about 60% back to normal, takes his time, sometimes use the wrong word, eating good, no trouble swallowing. Feels cognitively doing well. Remains on disability.remains on aspirin  81 and plavix  75 mg for 3 months then aspirin  alone. Crestor  20 mg. No side effects. Takes oxycodone  for chronic pain. Today BP 134/71, running in the 130's at home. His wife is a engineer, [name] Has been to see PMR, will see Dr. Carilyn this week. No more IV infusion, getting cephalosporin oral daily. Seeing transplant team at [facility] after this.  Has had some blurry vision to his left eye post CVA.  I did not see any notes from vascular during hospitalization, but patient's wife reports with left ICA occlusion no intervention was suggested.   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  Presented to the ER with AMS.  He was at his infusion center receiving chronic immunosuppressive therapy.  During infusion he became hypotensive and had full body burning sensation.  Upon ER arrival he was minimally responsive, given IM epi with mild improvement.  He was emergently intubated.  Code stroke was activated due to facial droop.  Felt concerning for anaphylactic shock.  MRI of the brain showed scattered small foci of acute subcortical infarction in the left occipital lobe and posterior left frontal lobe.Faint diffusion signal abnormality along the left parietal lobe may reflect evolving infarct versus seizure related cytotoxic edema. 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  He had right hemiparesis with significant right arm weakness and mild right leg weakness.  Speech nonfluent but able to speak sentences and follow commands.  Carotid ultrasound confirmed left ICA occlusion.  He was discharged to inpatient rehab 09/30/2023.  On 10/12 he had an unwitnessed fall.  CT head showed evolving acute left frontal/parietal infarct, small volume hemorrhage.  10/14 MRI showed subacute infarct with petechial hemorrhage and MRA showed left ICA now recannulized.   -CT head no acute intercranial hemorrhage or evidence of acute large vessel infarct. -MRI brain/MRA head: scattered small foci of acute subcortical infarction in the left occipital lobe and posterior left frontal lobe. Faint diffusion signal abnormality along the left parietal lobe may reflect evolving infarct versus seizure related to  cytotoxic edema. 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  -10/13/24New MRI of the brain showed new areas left MCA infarct compared with last MRI but now subacute 10/08/23 -New MRA showed left ICA recannulized from extracranial portion but still has high-grade stenosis at  left ICA siphon -Carotid Doppler left ICA occlusion -2D echo EF 60 to 65%, moderate left ventricular hypertrophy -LDL 90, rosuvastatin  was increased to 20 -A1c 5.8 -No antithrombotic prior to admission, now aspirin  81 daily and Plavix  75 daily for 3 months then aspirin  alone -UDS was negative -EEG showed cortical dysfunction from the left hemisphere likely secondary to underlying stroke.  REVIEW OF SYSTEMS: Out of a complete 14 system review of symptoms, the patient complains only of the following symptoms, and all other reviewed systems are negative.   Listed in HPI  ALLERGIES: Allergies  Allergen Reactions   Belatacept  Anaphylaxis    hypotensive with altered reaction and required epinephrine .  Needed to be intubated for airway protection.   Amlodipine  Rash   Lisinopril Rash    HOME MEDICATIONS: Outpatient Medications Prior to Visit  Medication Sig Dispense Refill   acetaminophen  (TYLENOL ) 325 MG tablet Take 1-2 tablets (325-650 mg total) by mouth every 4 (four) hours as needed for mild pain (pain score 1-3).     aspirin  EC 81 MG tablet Take 1 tablet (81 mg total) by mouth daily. Swallow whole.     atorvastatin  (LIPITOR) 40 MG tablet TAKE 1 TABLET(40 MG) BY MOUTH DAILY 90 tablet 1   brimonidine  (ALPHAGAN ) 0.2 % ophthalmic solution Place 1 drop into both eyes in the morning and at bedtime. 5 mL 3   carvedilol  (COREG ) 12.5 MG tablet TAKE 1 TABLET(12.5 MG) BY MOUTH TWICE DAILY WITH A MEAL 180 tablet 1   cycloSPORINE  modified (NEORAL ) 100 MG capsule Take 100 mg by mouth 2 (two) times daily. Taken with 50 mg tablet     cycloSPORINE  modified (NEORAL ) 50 MG capsule Take 1 capsule by mouth two times daily, Take along with 100 mg twice a day 60 capsule 5   dorzolamide -timolol  (COSOPT ) 2-0.5 % ophthalmic solution Place 1 drop into both eyes 2 (two) times daily. 10 mL 2   hydrALAZINE  (APRESOLINE ) 50 MG tablet TAKE 1 TABLET(50 MG) BY MOUTH THREE TIMES DAILY 270 tablet 0   latanoprost  (XALATAN )  0.005 % ophthalmic solution Place 1 drop into the left eye daily. 7.5 mL 1   mycophenolate  (MYFORTIC ) 180 MG EC tablet Take 1 tablet (180 mg total) by mouth 2 (two) times daily. 180 tablet 0   naloxone  (NARCAN ) nasal spray 4 mg/0.1 mL Inhale 1 (one) Spray if poorly responding or lips turning blue 2 each 2   naloxone  (NARCAN ) nasal spray 4 mg/0.1 mL Place 1 spray if poorly responding or lips turning blue 2 each 2   omeprazole  (PRILOSEC) 40 MG capsule Take 40 mg by mouth daily.     oxyCODONE  (ROXICODONE ) 15 MG immediate release tablet Take 1 tablet (15 mg total) by mouth 4 (four) times daily as needed. 120 tablet 0   oxyCODONE  (ROXICODONE ) 15 MG immediate release tablet Take 1 tablet (15 mg total) by mouth 4 (four) times daily as needed for pain. 120 tablet 0   predniSONE  (DELTASONE ) 5 MG tablet Take 5 mg by mouth daily.     sulfamethoxazole -trimethoprim  (BACTRIM ) 400-80 MG tablet Take 1 tablet by mouth every Monday, Wednesday, and Friday. 12 tablet 0   terazosin  (HYTRIN ) 2 MG capsule TAKE 2 CAPSULES(4 MG) BY MOUTH AT  BEDTIME 180 capsule 0   nystatin  (MYCOSTATIN ) 100000 UNIT/ML suspension Swish and swallow 5 mls four times daily for 14 days 280 mL 0   No facility-administered medications prior to visit.    PAST MEDICAL HISTORY: Past Medical History:  Diagnosis Date   AMPUTATION, BELOW KNEE, 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   Diabetes mellitus without complication [facility])    no since pancreas transplant   Dialysis patient 04/18/2012   [facility]; [place], [place], Sat   Gastroparesis    Gastropathy    GERD (gastroesophageal reflux disease)    Hypertension    MRSA infection    over 10 years ago per patient. in legs    PAST SURGICAL HISTORY: Past Surgical History:  Procedure Laterality Date   AV FISTULA PLACEMENT   08/2011   left upper arm   BELOW KNEE LEG AMPUTATION  it's been awhile   bilaterally   CATARACT EXTRACTION  ~ 2011   right   COMBINED KIDNEY-PANCREAS TRANSPLANT  2014   ESOPHAGOGASTRODUODENOSCOPY N/A 12/30/2016   Procedure: ESOPHAGOGASTRODUODENOSCOPY (EGD);  Surgeon: Belvie Just, MD;  Location: [facility] ENDOSCOPY;  Service: Endoscopy;  Laterality: N/A;   IR US  GUIDE BX ASP/DRAIN  09/16/2024   OLECRANON BURSECTOMY Right 06/23/2020   Procedure: RIGHT ELBOW EXCISION OLECRANON BURSITIS;  Surgeon: Harden Jerona GAILS, MD;  Location:  SURGERY CENTER;  Service: Orthopedics;  Laterality: Right;    FAMILY HISTORY: Family History  Problem Relation Age of Onset   Hypertension Mother    Diabetes Mother    Kidney disease Mother    Diabetes Maternal Grandmother    Diabetes Paternal Grandmother    Diabetes Other    Hypertension Other    Lung cancer Maternal Aunt    Colon cancer Neg Hx    Esophageal cancer Neg Hx    Rectal cancer Neg Hx    Stomach cancer Neg Hx     SOCIAL HISTORY: Social History   Socioeconomic History   Marital status: Married    Spouse name: Not on file   Number of children: 1   Years of education: 11   Highest education level: 12th grade  Occupational History   Occupation: Disability  Tobacco Use   Smoking status: Every Day    Types: Cigars    Last attempt to quit: 07/06/2022    Years since quitting: 2.4   Smokeless tobacco: Never   Tobacco comments:    black and mild  Vaping Use   Vaping status: Never Used  Substance and Sexual Activity   Alcohol use: No   Drug use: Not Currently    Frequency: 3.0 times per week    Types: Marijuana    Comment: Occasionally   Sexual activity: Yes    Partners: Female  Other Topics Concern   Not on file  Social History Narrative   Married   Recreational Drug use; Smoking   Social Drivers of Health   Financial Resource Strain: Low Risk  (10/15/2024)   Overall Financial Resource Strain (CARDIA)    Difficulty of Paying  Living Expenses: Not hard at all  Food Insecurity: No Food Insecurity (10/15/2024)   Hunger Vital Sign    Worried About Running Out of Food in the Last Year: Never true  Ran Out of Food in the Last Year: Never true  Transportation Needs: No Transportation Needs (10/15/2024)   PRAPARE - Administrator, Civil Service (Medical): No    Lack of Transportation (Non-Medical): No  Physical Activity: Insufficiently Active (10/15/2024)   Exercise Vital Sign    Days of Exercise per Week: 3 days    Minutes of Exercise per Session: 20 min  Stress: No Stress Concern Present (10/15/2024)   [employer] of Occupational Health - Occupational Stress Questionnaire    Feeling of Stress: Not at all  Social Connections: Moderately Integrated (10/15/2024)   Social Connection and Isolation Panel    Frequency of Communication with Friends and Family: More than three times a week    Frequency of Social Gatherings with Friends and Family: Twice a week    Attends Religious Services: More than 4 times per year    Active Member of [employer] or Organizations: No    Attends Banker Meetings: Not on file    Marital Status: Married  Catering Manager Violence: Not At Risk (09/11/2024)   Humiliation, Afraid, Rape, and Kick questionnaire    Fear of Current or Ex-Partner: No    Emotionally Abused: No    Physically Abused: No    Sexually Abused: No      PHYSICAL EXAM  Vitals:   12/02/24 1412  BP: 121/67  Pulse: 70  Weight: 249 lb (112.9 kg)  Height: 6' 1 (1.854 m)   Body mass index is 32.85 kg/m.  Generalized: Well developed, in no acute distress   Neurological examination  Mentation: Alert oriented to time, place, history taking. Follows all commands speech and language fluent Cranial nerve II-XII: Pupils were equal round reactive to light. Extraocular movements were full, visual field were full on confrontational test. Facial sensation and strength were normal. Uvula tongue  midline. Head turning and shoulder shrug  were normal and symmetric. Motor: The motor testing reveals 5 over 5 strength of all 4 extremities.  Bilateral below the knee amputations Sensory: Sensory testing is intact to soft touch on all 4 extremities. No evidence of extinction is noted.  Coordination: Cerebellar testing reveals good finger-nose-finger and heel-to-shin bilaterally.  Gait and station: In a wheelchair   DIAGNOSTIC DATA (LABS, IMAGING, TESTING) - I reviewed patient records, labs, notes, testing and imaging myself where available.  Lab Results  Component Value Date   WBC 6.0 03/25/2024   HGB 14.9 03/25/2024   HCT 47.1 03/25/2024   MCV 97.7 03/25/2024   PLT 207 03/25/2024      Component Value Date/Time   NA 137 04/24/2024 1646   NA 139 09/26/2021 0000   K 4.4 04/24/2024 1646   CL 106 04/24/2024 1646   CO2 25 04/24/2024 1646   GLUCOSE 104 (H) 04/24/2024 1646   BUN 27 (H) 04/24/2024 1646   BUN 21 09/26/2021 0000   CREATININE 2.19 (H) 04/24/2024 1646   CREATININE 1.20 05/30/2014 1415   CALCIUM  9.0 04/24/2024 1646   CALCIUM  8.4 09/08/2011 0746   PROT 6.4 10/16/2024 1545   ALBUMIN 3.8 10/16/2024 1545   AST 12 10/16/2024 1545   ALT 12 10/16/2024 1545   ALKPHOS 89 10/16/2024 1545   BILITOT 0.7 10/16/2024 1545   GFRNONAA 33 (L) 03/25/2024 1634   GFRAA 51 09/26/2021 0000   Lab Results  Component Value Date   CHOL 215 (H) 10/16/2024   HDL 41.30 10/16/2024   LDLCALC 134 (H) 10/16/2024   LDLDIRECT 116 (H) 01/21/2011  TRIG 198.0 (H) 10/16/2024   CHOLHDL 5 10/16/2024   Lab Results  Component Value Date   HGBA1C 6.5 10/16/2024   Lab Results  Component Value Date   VITAMINB12 545 02/01/2008   Lab Results  Component Value Date   TSH 0.63 10/16/2024      ASSESSMENT AND PLAN 48 y.o. year old male  has a past medical history of AMPUTATION, BELOW KNEE, HX OF (04/08/2008), Arthritis, Blood transfusion, Cataract, Chronic pain, Depression, Diabetes mellitus  without complication (HCC), Dialysis patient (04/18/2012), Gastroparesis, Gastropathy, GERD (gastroesophageal reflux disease), Hypertension, and MRSA infection. here with:  History of stroke Hyperlipidemia Diabetes    Continue aspirin  81 mg daily  for secondary stroke prevention.  Discussed secondary stroke prevention measures and importance of close PCP follow up for aggressive stroke risk factor management. I have gone over the pathophysiology of stroke, warning signs and symptoms, risk factors and their management in some detail with instructions to go to the closest emergency room for symptoms of concern. HTN: BP goal <130/90.   HLD: LDL goal <70.  DMII: A1c goal<7.0.  Encouraged patient to monitor diet and encouraged exercise FU with our office 1 year-patient and wife requested to follow-up in a year    No orders of the defined types were placed in this encounter.  No orders of the defined types were placed in this encounter.    Duwaine Russell, MSN, NP-C 12/02/2024, 2:20 PM [facility] [address], [address] [place], [place] [address] [phone]

## 2024-12-11 ENCOUNTER — Other Ambulatory Visit: Payer: Self-pay

## 2024-12-11 MED ORDER — OXYCODONE HCL 15 MG PO TABS
15.0000 mg | ORAL_TABLET | Freq: Four times a day (QID) | ORAL | 0 refills | Status: AC
  Filled 2024-12-23: qty 120, 30d supply, fill #0

## 2024-12-22 ENCOUNTER — Encounter: Payer: Self-pay | Admitting: Internal Medicine

## 2024-12-23 ENCOUNTER — Other Ambulatory Visit: Payer: Self-pay | Admitting: Internal Medicine

## 2024-12-23 ENCOUNTER — Other Ambulatory Visit: Payer: Self-pay

## 2024-12-23 DIAGNOSIS — I151 Hypertension secondary to other renal disorders: Secondary | ICD-10-CM

## 2024-12-23 DIAGNOSIS — I10 Essential (primary) hypertension: Secondary | ICD-10-CM

## 2024-12-24 ENCOUNTER — Other Ambulatory Visit: Payer: Self-pay

## 2024-12-24 DIAGNOSIS — I151 Hypertension secondary to other renal disorders: Secondary | ICD-10-CM

## 2024-12-24 DIAGNOSIS — I10 Essential (primary) hypertension: Secondary | ICD-10-CM

## 2024-12-24 MED ORDER — HYDRALAZINE HCL 50 MG PO TABS: 50.0000 mg | ORAL_TABLET | Freq: Three times a day (TID) | ORAL | 0 refills | Status: AC

## 2024-12-27 ENCOUNTER — Other Ambulatory Visit: Payer: Self-pay

## 2024-12-30 ENCOUNTER — Other Ambulatory Visit: Payer: Self-pay

## 2024-12-30 NOTE — Progress Notes (Signed)
 " [facility] - Clinic Note  01/06/2025   CHIEF COMPLAINT Patient presents for Retina Follow Up  HISTORY OF PRESENT ILLNESS: [name] is a 49 y.o. male who presents to the clinic today for:  HPI     Retina Follow Up   Patient presents with  Diabetic Retinopathy.  In both eyes.  This started 7 weeks ago.  Duration of 7 weeks.  Since onset it is stable.  I, the attending physician,  performed the HPI with the patient and updated documentation appropriately.        Comments   7 week retina follow up PDR OU and IVA OU pt is reporting no vision changes noticed denies any flashes or floaters pt is not checking blood sugar due to having pancreas and kidney transplant pt is using Cosopt  BID OU did not use today       Last edited by [name], MD on 01/06/2025 11:17 PM.     Pt states VA is stable. He forgot to put in his eye drop in this morning.  Referring physician: [name], MD [address] [place],  [place] [address]  HISTORICAL INFORMATION:  Selected notes from the MEDICAL RECORD NUMBER Referred by Dr. Hamilton Octavia for PDR OU LEE:  Ocular Hx- previously followed with Dr. 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   latanoprost  (XALATAN ) 0.005 % ophthalmic solution Place 1 drop into the left eye daily.   dorzolamide -timolol  (COSOPT ) 2-0.5 % ophthalmic solution Place 1 drop into both eyes 2 (two) times daily.   No current facility-administered medications for this visit. (Ophthalmic Drugs)   Current Outpatient Medications (Other)  Medication Sig   acetaminophen  (TYLENOL ) 325 MG tablet Take 1-2 tablets  (325-650 mg total) by mouth every 4 (four) hours as needed for mild pain (pain score 1-3).   aspirin  EC 81 MG tablet Take 1 tablet (81 mg total) by mouth daily. Swallow whole.   atorvastatin  (LIPITOR) 40 MG tablet TAKE 1 TABLET(40 MG) BY MOUTH DAILY   carvedilol  (COREG ) 12.5 MG tablet TAKE 1 TABLET(12.5 MG) BY MOUTH TWICE DAILY WITH A MEAL   cycloSPORINE  modified (NEORAL ) 100 MG capsule Take 100 mg by mouth 2 (two) times daily. Taken with 50 mg tablet   cycloSPORINE  modified (NEORAL ) 50 MG capsule Take 1 capsule (50 mg total) by mouth 2 (two) times daily.Take along with 100 mg twice a day.   hydrALAZINE  (APRESOLINE ) 50 MG tablet Take 1 tablet (50 mg total) by mouth 3 (three) times daily.   mycophenolate  (MYFORTIC ) 180 MG EC tablet Take 1 tablet (180 mg total) by mouth 2 (two) times daily.   naloxone  (NARCAN ) nasal spray 4 mg/0.1 mL Inhale 1 (one) Spray if poorly responding or lips turning blue   naloxone  (NARCAN ) nasal spray 4 mg/0.1 mL Place 1 spray if poorly responding or lips turning blue   omeprazole  (PRILOSEC) 40 MG capsule Take 40 mg by mouth daily.   oxyCODONE  (ROXICODONE ) 15 MG immediate release tablet Take 1 tablet (15 mg total) by mouth 4 (four) times daily as needed.   oxyCODONE  (ROXICODONE ) 15 MG immediate release tablet Take 1 tablet (15 mg  total) by mouth 4 (four) times daily as needed for pain.   oxyCODONE  (ROXICODONE ) 15 MG immediate release tablet Take 1 (one) Tablet four times daily, if needed for pain   predniSONE  (DELTASONE ) 5 MG tablet Take 5 mg by mouth daily.   sulfamethoxazole -trimethoprim  (BACTRIM ) 400-80 MG tablet Take 1 tablet by mouth every Monday, Wednesday, and Friday.   terazosin  (HYTRIN ) 2 MG capsule TAKE 2 CAPSULES(4 MG) BY MOUTH AT BEDTIME   nystatin  (MYCOSTATIN ) 100000 UNIT/ML suspension Swish and swallow 5 mls four times daily for 14 days   No current facility-administered medications for this visit. (Other)   REVIEW OF SYSTEMS: ROS   Positive for:  Musculoskeletal, Eyes Last edited by Resa Delon ORN, COT on 01/06/2025  2:35 PM.      ALLERGIES Allergies  Allergen Reactions   Belatacept  Anaphylaxis    hypotensive with altered reaction and required epinephrine .  Needed to be intubated for airway protection.   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   Diabetes mellitus without complication [facility])    no since pancreas transplant   Dialysis patient 04/18/2012   [facility]; [place], [place], [place]   Gastroparesis    Gastropathy    GERD (gastroesophageal reflux disease)    Hypertension    MRSA infection    over 10 years ago per patient. in legs   Past Surgical History:  Procedure Laterality Date   AV FISTULA PLACEMENT  08/2011   left upper arm   BELOW KNEE LEG AMPUTATION  it's been awhile   bilaterally   CATARACT EXTRACTION  ~ 2011   right   COMBINED KIDNEY-PANCREAS TRANSPLANT  2014   ESOPHAGOGASTRODUODENOSCOPY N/A 12/30/2016   Procedure: ESOPHAGOGASTRODUODENOSCOPY (EGD);  Surgeon: Belvie Just, MD;  Location: [facility] ENDOSCOPY;  Service: Endoscopy;  Laterality: N/A;   IR US  GUIDE BX ASP/DRAIN  09/16/2024   OLECRANON BURSECTOMY Right 06/23/2020   Procedure: RIGHT ELBOW EXCISION OLECRANON BURSITIS;  Surgeon: Harden Jerona GAILS, MD;  Location: [facility];  Service: Orthopedics;  Laterality: Right;   FAMILY HISTORY Family History  Problem Relation Age of Onset   Hypertension Mother    Diabetes Mother    Kidney disease Mother    Diabetes Maternal Grandmother    Diabetes Paternal Grandmother    Diabetes Other    Hypertension Other    Lung cancer Maternal Aunt    Colon cancer Neg Hx    Esophageal cancer Neg Hx    Rectal cancer  Neg Hx    Stomach cancer Neg Hx    SOCIAL HISTORY Social History   Tobacco Use   Smoking status: Every Day    Types: Cigars    Last attempt to quit: 07/06/2022    Years since quitting: 2.5   Smokeless tobacco: Never   Tobacco comments:    black and mild  Vaping Use   Vaping status: Never Used  Substance Use Topics   Alcohol use: No   Drug use: Not Currently    Frequency: 3.0 times per week    Types: Marijuana    Comment: Occasionally       OPHTHALMIC EXAM:  Base  Eye Exam     Visual Acuity (Snellen - Linear)       Right Left   Dist cc 20/25 -1 20/100 -1   Dist ph cc NI NI    Correction: Glasses         Tonometry (Tonopen, 2:41 PM)       Right Left   Pressure 19 33  1 gtts dorz/brom os         Pupils       Pupils Dark Light Shape React APD   Right PERRL 3 2 Round Brisk None   Left PERRL 3 2 Round Brisk None         Visual Fields       Left Right    Full Full         Extraocular Movement       Right Left    Full, Ortho Full, Ortho         Neuro/Psych     Oriented x3: Yes   Mood/Affect: Normal           Slit Lamp and Fundus Exam     Slit Lamp Exam       Right Left   Lids/Lashes Dermatochalasis - upper lid Dermatochalasis - upper lid   Conjunctiva/Sclera Melanosis Melanosis   Cornea well healed cataract wound, trace PEE, trace tear film debris well healed cataract wound, trace PEE, tear film debris   Anterior Chamber deep and clear deep and clear   Iris Round and moderately dilated, No NVI Round and moderately dilated, No NVI   Lens PC IOL in good position with open PC PC IOL in good position with open PC   Anterior Vitreous White blood stained vit condensations settled inferiorly. Old white blood stained vitreous condensations-improving         Fundus Exam       Right Left   Disc 3+Pallor, Sharp rim, +fibrosis 3+Pallor, Sharp rim, +fibrosis, vascular loops / NVD-regressed   C/D Ratio 0.3 0.4   Macula Flat, Good foveal  reflex, scattered MA / DBH, central cystic changes--slightly improved, +atrophy Flat, good foveal reflex, scattered fibrosis, scattered MA/DBH--improved   Vessels attenuated, Tortuous, +NV-regressing severe attenuation, Tortuous, +NV--regressing   Periphery Attached, scattered MA / DBH- improved Attached, scattered MA, diffuse atrophy, good PRP changes 360, room for fill in inferiorly           Refraction     Wearing Rx       Sphere Cylinder Axis Add   Right -2.25 +1.25 127 +1.25   Left -1.50 +1.00 045 +1.25           IMAGING AND PROCEDURES  Imaging and Procedures for 01/06/2025  OCT, Retina - OU - Both Eyes       Right Eye Quality was good. Central Foveal Thickness: 240. Progression has improved. Findings include no SRF, abnormal foveal contour, intraretinal fluid, inner retinal atrophy, vitreomacular adhesion (Diffuse IRA, central cystic changes-improved, +vitreous opacities -- stably improved).   Left Eye Quality was good. Central Foveal Thickness: 219. Progression has improved. Findings include no SRF, abnormal foveal contour, epiretinal membrane, intraretinal fluid, vitreous traction, preretinal fibrosis (Diffuse atrophy, prominent posterior hyaloid with stable improvement in subhyaloid opacities, focal tractional edema along ST arcades, persistent cystic changes--slightly improved).   Notes *Images captured and stored on drive  Diagnosis / Impression:  OD: Diffuse IRA, central cystic changes-improved,  +vitreous opacities -- stably improved OS: Diffuse atrophy, prominent posterior hyaloid with stable improvement in subhyaloid opacities,  focal tractional edema along ST arcades, persistent cystic changes--slightly improved  Clinical management:  See below  Abbreviations: NFP - Normal foveal profile. CME - cystoid macular edema. PED - pigment epithelial detachment. IRF - intraretinal fluid. SRF - subretinal fluid. EZ - ellipsoid zone. ERM - epiretinal membrane. ORA -  outer retinal atrophy. ORT - outer retinal tubulation. SRHM - subretinal hyper-reflective material. IRHM - intraretinal hyper-reflective material      Intravitreal Injection, Pharmacologic Agent - OD - Right Eye       Time Out 01/06/2025. 3:59 PM. Confirmed correct patient, procedure, site, and patient consented.   Anesthesia Topical anesthesia was used. Anesthetic medications included Lidocaine  2%, Proparacaine 0.5%.   Procedure Preparation included 5% betadine to ocular surface, eyelid speculum. A (32g) needle was used.   Injection: 1.25 mg Bevacizumab  1.25mg /0.81ml   Route: Intravitreal, Site: Right Eye   NDC: H525437, Lot: [id number], Expiration date: 03/13/2025   Post-op Post injection exam found visual acuity of at least counting fingers. The patient tolerated the procedure well. There were no complications. The patient received written and verbal post procedure care education.      Intravitreal Injection, Pharmacologic Agent - OS - Left Eye       Time Out 01/06/2025. 4:00 PM. Confirmed correct patient, procedure, site, and patient consented.   Anesthesia Topical anesthesia was used. Anesthetic medications included Proparacaine 0.5%.   Procedure Preparation included 5% betadine to ocular surface, eyelid speculum. A supplied needle was used.   Injection: 1.25 mg Bevacizumab  1.25mg /0.5ml   Route: Intravitreal, Site: Left Eye   NDC: 49757-939-98, Lot: [id number]$ , Expiration date: 01/16/2025   Post-op Post injection exam found visual acuity of at least counting fingers. The patient tolerated the procedure well. There were no complications. The patient received written and verbal post procedure care education.           ASSESSMENT/PLAN:   ICD-10-CM   1. Proliferative diabetic retinopathy of both eyes with macular edema associated with type 2 diabetes mellitus (HCC)  E11.3513 OCT, Retina - OU - Both Eyes    Intravitreal Injection, Pharmacologic Agent - OD - Right  Eye    Intravitreal Injection, Pharmacologic Agent - OS - Left Eye    Bevacizumab  (AVASTIN ) SOLN 1.25 mg    Bevacizumab  (AVASTIN ) SOLN 1.25 mg    2. Optic neuropathy  H46.9     3. Essential hypertension  I10     4. Hypertensive retinopathy of both eyes  H35.033     5. 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 Pseudophakia, both eyes  Z96.1     7. History of stroke  Z86.73      **Pt reports decreased vision OU since having stroke in Sept 2024**  1. Proliferative diabetic retinopathy, both eyes  - last A1c 6.4 on 04.30.25 -- s/p kidney and pancreas transplant - IVA OU #1 (05.23.25), #2 (06.23.25), #3 (07.21.25) #4 (08.20.29), #5 (09.17.25), #6 (10.17.25), #7 (11.24.25)  - s/p PRP OS 07.29.25 - exam shows blood stained vit condensations OU (OD red; OS white); scattered NV/fibrosis OU - FA (05.23.25) shows OD: Large areas of vascular non-perfusion superior, nasal and inferior midzone, leaking NV superior and inferior to disc; OS: Large areas of vascular non-perfusion superior, nasal and inferior midzone, leaking NV greatest nasal to disc and along temporal arcades -- pt will need PRP OU - OCT shows OD: Diffuse IRA, central cystic changes-improved,  +vitreous opacities -- stably improved; OS: Diffuse atrophy, prominent posterior hyaloid with stable improvement in  subhyaloid opacities, focal tractional edema along ST arcades, persistent cystic changes--slightly improved at 7 wks  **discussed decreased efficacy / resistance to Avastin  and potential benefit of switching to Eylea**  - Eylea approved and payable 100% - BCVA OD 20/25 - stable; OS 20/100 stable - IOP 19 OS today  - recommend IVA today OU #8 (01.12.26) w/ follow up in 7 wks - pt wishes to proceed with injection - RBA of procedure discussed, questions answered - IVA informed consent obtained and signed, 05.23.25 - see procedure note  - f/u 7 weeks DFE, OCT, possible injections  2. Bilateral optic neuropathy  - thought to  be secondary to tacrolimus  use - previously managed and monitored by Dr. Merita visit 2022  - exam shows 3+ disc pallor OU  - monitor   3,4. Hypertensive retinopathy OU - discussed importance of tight BP control - monitor   5. Ocular Hypertension OU  - IOP 19, 33 --hx of steroid response OS  - h/o possible adverse rxn to Brimonidine   - switched to Cosopt  BID OU - monitor  6. Pseudophakia OU  - s/p CE/IOL  - IOL in good position, doing well  - monitor   7. H/o CVA - Sept .2024  - occurred while receiving belatacept  infusion  - MRI Brain 09.23.2024 IMPRESSION: 1. Scattered small foci of acute subcortical infarction in the left occipital lobe and posterior left frontal lobe. 2. Faint diffusion signal abnormality along the left parietal lobe may reflect evolving infarct versus seizure related cytotoxic edema. 3. 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    Ophthalmic Meds Ordered this visit:  Meds ordered this encounter  Medications   dorzolamide -timolol  (COSOPT ) 2-0.5 % ophthalmic solution    Sig: Place 1 drop into both eyes 2 (two) times daily.    Dispense:  10 mL    Refill:  6   Bevacizumab  (AVASTIN ) SOLN 1.25 mg   Bevacizumab  (AVASTIN ) SOLN 1.25 mg     Return in about 7 weeks (around 02/24/2025) for f/u, PDR, DFE, OCT, Possible, IVA, OU.  There are no Patient Instructions on file for this visit.  Explained the diagnoses, plan, and follow up with the patient and they expressed understanding.  Patient expressed understanding of the importance of proper follow up care.   This document serves as a record of services personally performed by Redell JUDITHANN Hans, MD, PhD. It was created on their behalf by Paulina Jamse Gay an ophthalmic technician. The creation of this record is the provider's dictation and/or activities during the visit.   Electronically signed by: Alana D Fowler  01/06/2025  11:33 PM   This document serves as a record of  services personally performed by Redell JUDITHANN Hans, MD, PhD. It was created on their behalf by Wanda GEANNIE Keens, COT an ophthalmic technician. The creation of this record is the provider's dictation and/or activities during the visit.    Electronically signed by:  Wanda GEANNIE Keens, COT  01/06/2025 11:33 PM  Redell JUDITHANN Hans, M.D., Ph.D. Diseases & Surgery of the Retina and Vitreous [facility] 01/06/2025  I have reviewed the above documentation for accuracy and completeness, and I agree with the above. Redell JUDITHANN Hans, M.D., Ph.D. 01/06/2025 11:40 PM    Abbreviations: M myopia (nearsighted); A astigmatism; H hyperopia (farsighted); P presbyopia; Mrx spectacle prescription;  CTL contact lenses; OD right eye; OS left eye; OU both eyes  XT exotropia; ET esotropia; PEK punctate epithelial keratitis; PEE punctate epithelial  erosions; DES dry eye syndrome; MGD meibomian gland dysfunction; ATs artificial tears; PFAT's preservative free artificial tears; NSC nuclear sclerotic cataract; PSC posterior subcapsular cataract; ERM epi-retinal membrane; PVD posterior vitreous detachment; RD retinal detachment; DM diabetes mellitus; DR diabetic retinopathy; NPDR non-proliferative diabetic retinopathy; PDR proliferative diabetic retinopathy; CSME clinically significant macular edema; DME diabetic macular edema; dbh dot blot hemorrhages; CWS cotton wool spot; POAG primary open angle glaucoma; C/D cup-to-disc ratio; HVF humphrey visual field; GVF goldmann visual field; OCT optical coherence tomography; IOP intraocular pressure; BRVO Branch retinal vein occlusion; CRVO central retinal vein occlusion; CRAO central retinal artery occlusion; BRAO branch retinal artery occlusion; RT retinal tear; SB scleral buckle; PPV pars plana vitrectomy; VH Vitreous hemorrhage; PRP panretinal laser photocoagulation; IVK intravitreal kenalog; VMT vitreomacular traction; MH Macular hole;  NVD neovascularization of the  disc; NVE neovascularization elsewhere; AREDS age related eye disease study; ARMD age related macular degeneration; POAG primary open angle glaucoma; EBMD epithelial/anterior basement membrane dystrophy; ACIOL anterior chamber intraocular lens; IOL intraocular lens; PCIOL posterior chamber intraocular lens; Phaco/IOL phacoemulsification with intraocular lens placement; PRK photorefractive keratectomy; LASIK laser assisted in situ keratomileusis; HTN hypertension; DM diabetes mellitus; COPD chronic obstructive pulmonary disease    "

## 2024-12-31 ENCOUNTER — Other Ambulatory Visit: Payer: Self-pay

## 2024-12-31 MED ORDER — CYCLOSPORINE MODIFIED 50 MG PO CAPS
50.0000 mg | ORAL_CAPSULE | Freq: Two times a day (BID) | ORAL | 5 refills | Status: AC
  Filled 2024-12-31: qty 60, 30d supply, fill #0
  Filled 2025-01-27: qty 60, 30d supply, fill #1

## 2025-01-01 ENCOUNTER — Other Ambulatory Visit: Payer: Self-pay

## 2025-01-02 ENCOUNTER — Other Ambulatory Visit: Payer: Self-pay

## 2025-01-06 ENCOUNTER — Encounter (INDEPENDENT_AMBULATORY_CARE_PROVIDER_SITE_OTHER): Payer: Self-pay | Admitting: Ophthalmology

## 2025-01-06 ENCOUNTER — Ambulatory Visit (INDEPENDENT_AMBULATORY_CARE_PROVIDER_SITE_OTHER): Admitting: Ophthalmology

## 2025-01-06 DIAGNOSIS — H469 Unspecified optic neuritis: Secondary | ICD-10-CM

## 2025-01-06 DIAGNOSIS — I10 Essential (primary) hypertension: Secondary | ICD-10-CM

## 2025-01-06 DIAGNOSIS — E113513 Type 2 diabetes mellitus with proliferative diabetic retinopathy with macular edema, bilateral: Secondary | ICD-10-CM | POA: Diagnosis not present

## 2025-01-06 DIAGNOSIS — Z961 Presence of intraocular lens: Secondary | ICD-10-CM

## 2025-01-06 DIAGNOSIS — H40053 Ocular hypertension, bilateral: Secondary | ICD-10-CM | POA: Diagnosis not present

## 2025-01-06 DIAGNOSIS — H35033 Hypertensive retinopathy, bilateral: Secondary | ICD-10-CM | POA: Diagnosis not present

## 2025-01-06 DIAGNOSIS — Z8673 Personal history of transient ischemic attack (TIA), and cerebral infarction without residual deficits: Secondary | ICD-10-CM

## 2025-01-06 MED ORDER — BEVACIZUMAB CHEMO INJECTION 1.25MG/0.05ML SYRINGE FOR KALEIDOSCOPE
1.2500 mg | INTRAVITREAL | Status: AC | PRN
  Administered 2025-01-06: 1.25 mg via INTRAVITREAL

## 2025-01-06 MED ORDER — DORZOLAMIDE HCL-TIMOLOL MAL 2-0.5 % OP SOLN: 1.0000 [drp] | Freq: Two times a day (BID) | OPHTHALMIC | 6 refills | Status: AC

## 2025-01-10 ENCOUNTER — Institutional Professional Consult (permissible substitution) (INDEPENDENT_AMBULATORY_CARE_PROVIDER_SITE_OTHER): Admitting: Physician Assistant

## 2025-01-14 ENCOUNTER — Other Ambulatory Visit: Payer: Self-pay

## 2025-01-14 MED ORDER — OXYCODONE HCL 15 MG PO TABS
15.0000 mg | ORAL_TABLET | Freq: Four times a day (QID) | ORAL | 0 refills | Status: AC | PRN
  Filled 2025-01-14 – 2025-01-22 (×2): qty 120, 30d supply, fill #0

## 2025-01-15 ENCOUNTER — Other Ambulatory Visit: Payer: Self-pay

## 2025-01-22 ENCOUNTER — Other Ambulatory Visit: Payer: Self-pay

## 2025-01-27 ENCOUNTER — Other Ambulatory Visit: Payer: Self-pay | Admitting: Internal Medicine

## 2025-01-27 ENCOUNTER — Other Ambulatory Visit: Payer: Self-pay

## 2025-01-27 DIAGNOSIS — I151 Hypertension secondary to other renal disorders: Secondary | ICD-10-CM

## 2025-01-28 ENCOUNTER — Other Ambulatory Visit: Payer: Self-pay

## 2025-02-24 ENCOUNTER — Encounter (INDEPENDENT_AMBULATORY_CARE_PROVIDER_SITE_OTHER): Admitting: Ophthalmology

## 2025-10-20 ENCOUNTER — Ambulatory Visit

## 2025-10-20 ENCOUNTER — Encounter: Admitting: Internal Medicine

## 2025-12-08 ENCOUNTER — Ambulatory Visit: Admitting: Adult Health
